# Patient Record
Sex: Male | Born: 1945 | Race: White | Hispanic: No | Marital: Married | Smoking: Former smoker
Health system: Southern US, Community
[De-identification: ages and names within clinical notes are randomized; demographics above are authoritative.]

## PROBLEM LIST (undated history)

## (undated) ENCOUNTER — Ambulatory Visit: Admission: EM | Payer: Medicare Other

## (undated) DIAGNOSIS — G8929 Other chronic pain: Secondary | ICD-10-CM

## (undated) DIAGNOSIS — M5136 Other intervertebral disc degeneration, lumbar region: Secondary | ICD-10-CM

## (undated) DIAGNOSIS — Z8719 Personal history of other diseases of the digestive system: Secondary | ICD-10-CM

## (undated) DIAGNOSIS — M545 Low back pain, unspecified: Secondary | ICD-10-CM

## (undated) DIAGNOSIS — Z972 Presence of dental prosthetic device (complete) (partial): Secondary | ICD-10-CM

## (undated) DIAGNOSIS — K222 Esophageal obstruction: Secondary | ICD-10-CM

## (undated) DIAGNOSIS — Z8739 Personal history of other diseases of the musculoskeletal system and connective tissue: Secondary | ICD-10-CM

## (undated) DIAGNOSIS — K219 Gastro-esophageal reflux disease without esophagitis: Secondary | ICD-10-CM

## (undated) DIAGNOSIS — M51369 Other intervertebral disc degeneration, lumbar region without mention of lumbar back pain or lower extremity pain: Secondary | ICD-10-CM

## (undated) DIAGNOSIS — I10 Essential (primary) hypertension: Secondary | ICD-10-CM

## (undated) DIAGNOSIS — I420 Dilated cardiomyopathy: Secondary | ICD-10-CM

## (undated) DIAGNOSIS — M1990 Unspecified osteoarthritis, unspecified site: Secondary | ICD-10-CM

## (undated) DIAGNOSIS — I5042 Chronic combined systolic (congestive) and diastolic (congestive) heart failure: Secondary | ICD-10-CM

## (undated) DIAGNOSIS — J449 Chronic obstructive pulmonary disease, unspecified: Secondary | ICD-10-CM

## (undated) DIAGNOSIS — I714 Abdominal aortic aneurysm, without rupture, unspecified: Secondary | ICD-10-CM

## (undated) DIAGNOSIS — I2510 Atherosclerotic heart disease of native coronary artery without angina pectoris: Secondary | ICD-10-CM

## (undated) DIAGNOSIS — N184 Chronic kidney disease, stage 4 (severe): Secondary | ICD-10-CM

## (undated) DIAGNOSIS — E785 Hyperlipidemia, unspecified: Secondary | ICD-10-CM

## (undated) DIAGNOSIS — I701 Atherosclerosis of renal artery: Secondary | ICD-10-CM

## (undated) DIAGNOSIS — J189 Pneumonia, unspecified organism: Secondary | ICD-10-CM

## (undated) DIAGNOSIS — E119 Type 2 diabetes mellitus without complications: Secondary | ICD-10-CM

## (undated) DIAGNOSIS — H9192 Unspecified hearing loss, left ear: Secondary | ICD-10-CM

## (undated) DIAGNOSIS — I480 Paroxysmal atrial fibrillation: Secondary | ICD-10-CM

## (undated) DIAGNOSIS — I493 Ventricular premature depolarization: Secondary | ICD-10-CM

## (undated) DIAGNOSIS — I255 Ischemic cardiomyopathy: Secondary | ICD-10-CM

## (undated) HISTORY — PX: LAPAROSCOPIC CHOLECYSTECTOMY: SUR755

## (undated) HISTORY — DX: Essential (primary) hypertension: I10

## (undated) HISTORY — PX: HERNIA REPAIR: SHX51

## (undated) HISTORY — DX: Chronic combined systolic (congestive) and diastolic (congestive) heart failure: I50.42

## (undated) HISTORY — DX: Esophageal obstruction: K22.2

## (undated) HISTORY — DX: Ischemic cardiomyopathy: I25.5

## (undated) HISTORY — DX: Gastro-esophageal reflux disease without esophagitis: K21.9

## (undated) HISTORY — PX: CORONARY ANGIOPLASTY WITH STENT PLACEMENT: SHX49

## (undated) HISTORY — DX: Unspecified osteoarthritis, unspecified site: M19.90

## (undated) HISTORY — DX: Abdominal aortic aneurysm, without rupture: I71.4

## (undated) HISTORY — DX: Atherosclerotic heart disease of native coronary artery without angina pectoris: I25.10

## (undated) HISTORY — DX: Ventricular premature depolarization: I49.3

## (undated) HISTORY — PX: CARDIAC CATHETERIZATION: SHX172

## (undated) HISTORY — DX: Chronic obstructive pulmonary disease, unspecified: J44.9

## (undated) HISTORY — DX: Abdominal aortic aneurysm, without rupture, unspecified: I71.40

## (undated) HISTORY — PX: UMBILICAL HERNIA REPAIR: SHX196

## (undated) HISTORY — DX: Atherosclerosis of renal artery: I70.1

## (undated) HISTORY — PX: CORONARY ANGIOPLASTY: SHX604

## (undated) HISTORY — DX: Hyperlipidemia, unspecified: E78.5

---

## 1989-01-29 DIAGNOSIS — J189 Pneumonia, unspecified organism: Secondary | ICD-10-CM

## 1989-01-29 HISTORY — DX: Pneumonia, unspecified organism: J18.9

## 1994-05-31 HISTORY — PX: LUNG SURGERY: SHX703

## 1994-05-31 HISTORY — PX: CORONARY ARTERY BYPASS GRAFT: SHX141

## 1998-05-31 HISTORY — PX: ESOPHAGOGASTRODUODENOSCOPY (EGD) WITH ESOPHAGEAL DILATION: SHX5812

## 1998-07-02 DIAGNOSIS — K222 Esophageal obstruction: Secondary | ICD-10-CM

## 1998-07-02 HISTORY — DX: Esophageal obstruction: K22.2

## 1999-01-30 HISTORY — PX: GREEN LIGHT LASER TURP (TRANSURETHRAL RESECTION OF PROSTATE: SHX6260

## 2003-11-15 ENCOUNTER — Inpatient Hospital Stay (HOSPITAL_BASED_OUTPATIENT_CLINIC_OR_DEPARTMENT_OTHER): Admission: RE | Admit: 2003-11-15 | Discharge: 2003-11-15 | Payer: Self-pay | Admitting: Cardiology

## 2003-11-18 ENCOUNTER — Ambulatory Visit (HOSPITAL_COMMUNITY): Admission: RE | Admit: 2003-11-18 | Discharge: 2003-11-19 | Payer: Self-pay | Admitting: Cardiology

## 2004-05-04 ENCOUNTER — Ambulatory Visit: Payer: Self-pay | Admitting: Cardiology

## 2004-05-04 ENCOUNTER — Inpatient Hospital Stay (HOSPITAL_COMMUNITY): Admission: EM | Admit: 2004-05-04 | Discharge: 2004-05-06 | Payer: Self-pay | Admitting: Emergency Medicine

## 2004-05-11 ENCOUNTER — Ambulatory Visit: Payer: Self-pay

## 2004-05-13 ENCOUNTER — Ambulatory Visit: Payer: Self-pay | Admitting: Cardiology

## 2004-05-31 DIAGNOSIS — I255 Ischemic cardiomyopathy: Secondary | ICD-10-CM

## 2004-05-31 HISTORY — DX: Ischemic cardiomyopathy: I25.5

## 2004-07-07 ENCOUNTER — Ambulatory Visit: Payer: Self-pay | Admitting: Gastroenterology

## 2004-07-10 ENCOUNTER — Ambulatory Visit: Payer: Self-pay | Admitting: Gastroenterology

## 2004-07-13 ENCOUNTER — Ambulatory Visit: Payer: Self-pay | Admitting: Gastroenterology

## 2004-08-18 ENCOUNTER — Ambulatory Visit: Payer: Self-pay | Admitting: Cardiology

## 2004-12-16 ENCOUNTER — Ambulatory Visit: Payer: Self-pay | Admitting: Cardiology

## 2005-04-14 ENCOUNTER — Inpatient Hospital Stay (HOSPITAL_COMMUNITY): Admission: EM | Admit: 2005-04-14 | Discharge: 2005-04-16 | Payer: Self-pay | Admitting: Emergency Medicine

## 2005-04-15 ENCOUNTER — Ambulatory Visit: Payer: Self-pay | Admitting: Internal Medicine

## 2005-04-15 ENCOUNTER — Ambulatory Visit: Payer: Self-pay | Admitting: Cardiology

## 2005-04-15 ENCOUNTER — Encounter: Payer: Self-pay | Admitting: Cardiology

## 2005-05-07 ENCOUNTER — Ambulatory Visit: Payer: Self-pay | Admitting: Cardiology

## 2005-05-17 ENCOUNTER — Ambulatory Visit: Payer: Self-pay | Admitting: Gastroenterology

## 2005-05-31 DIAGNOSIS — I2510 Atherosclerotic heart disease of native coronary artery without angina pectoris: Secondary | ICD-10-CM

## 2005-05-31 HISTORY — DX: Atherosclerotic heart disease of native coronary artery without angina pectoris: I25.10

## 2005-06-01 ENCOUNTER — Ambulatory Visit: Payer: Self-pay | Admitting: Gastroenterology

## 2005-06-04 ENCOUNTER — Ambulatory Visit (HOSPITAL_COMMUNITY): Admission: RE | Admit: 2005-06-04 | Discharge: 2005-06-04 | Payer: Self-pay | Admitting: Gastroenterology

## 2005-06-07 ENCOUNTER — Ambulatory Visit: Payer: Self-pay | Admitting: Cardiology

## 2005-06-28 ENCOUNTER — Ambulatory Visit: Payer: Self-pay | Admitting: Gastroenterology

## 2005-07-06 ENCOUNTER — Ambulatory Visit: Payer: Self-pay | Admitting: Cardiology

## 2005-07-27 ENCOUNTER — Ambulatory Visit: Payer: Self-pay | Admitting: Cardiology

## 2005-07-27 ENCOUNTER — Inpatient Hospital Stay (HOSPITAL_COMMUNITY): Admission: EM | Admit: 2005-07-27 | Discharge: 2005-08-06 | Payer: Self-pay | Admitting: *Deleted

## 2005-07-29 HISTORY — PX: CORONARY ARTERY BYPASS GRAFT: SHX141

## 2005-08-12 ENCOUNTER — Emergency Department (HOSPITAL_COMMUNITY): Admission: EM | Admit: 2005-08-12 | Discharge: 2005-08-12 | Payer: Self-pay | Admitting: Emergency Medicine

## 2005-08-17 ENCOUNTER — Ambulatory Visit: Payer: Self-pay | Admitting: Cardiology

## 2005-08-28 ENCOUNTER — Emergency Department: Payer: Self-pay | Admitting: Emergency Medicine

## 2005-10-05 ENCOUNTER — Ambulatory Visit: Payer: Self-pay | Admitting: Cardiology

## 2005-11-16 ENCOUNTER — Ambulatory Visit: Payer: Self-pay | Admitting: Cardiology

## 2006-05-12 ENCOUNTER — Ambulatory Visit: Payer: Self-pay | Admitting: Cardiovascular Disease

## 2006-05-12 ENCOUNTER — Inpatient Hospital Stay (HOSPITAL_COMMUNITY): Admission: EM | Admit: 2006-05-12 | Discharge: 2006-05-14 | Payer: Self-pay | Admitting: Emergency Medicine

## 2006-06-02 ENCOUNTER — Encounter: Payer: Self-pay | Admitting: Cardiology

## 2006-06-02 ENCOUNTER — Ambulatory Visit: Payer: Self-pay

## 2006-06-13 ENCOUNTER — Ambulatory Visit: Payer: Self-pay | Admitting: Cardiology

## 2006-06-13 LAB — CONVERTED CEMR LAB
BUN: 16 mg/dL (ref 6–23)
CO2: 29 meq/L (ref 19–32)
Calcium: 9.6 mg/dL (ref 8.4–10.5)
Chloride: 105 meq/L (ref 96–112)
Creatinine, Ser: 1.3 mg/dL (ref 0.4–1.5)
GFR calc non Af Amer: 59 mL/min
Glomerular Filtration Rate, Af Am: 72 mL/min/{1.73_m2}
Glucose, Bld: 82 mg/dL (ref 70–99)
Potassium: 4 meq/L (ref 3.5–5.1)
Sodium: 141 meq/L (ref 135–145)

## 2006-09-13 ENCOUNTER — Ambulatory Visit: Payer: Self-pay | Admitting: Cardiology

## 2006-10-14 ENCOUNTER — Ambulatory Visit: Payer: Self-pay | Admitting: Cardiovascular Disease

## 2006-10-21 ENCOUNTER — Ambulatory Visit: Payer: Self-pay | Admitting: Internal Medicine

## 2006-12-06 ENCOUNTER — Ambulatory Visit: Payer: Self-pay | Admitting: Cardiology

## 2006-12-19 ENCOUNTER — Ambulatory Visit: Payer: Self-pay | Admitting: Cardiology

## 2006-12-19 LAB — CONVERTED CEMR LAB
BUN: 18 mg/dL (ref 6–23)
CO2: 32 meq/L (ref 19–32)
Calcium: 9.1 mg/dL (ref 8.4–10.5)
Chloride: 108 meq/L (ref 96–112)
Creatinine, Ser: 1.3 mg/dL (ref 0.4–1.5)
GFR calc Af Amer: 72 mL/min
GFR calc non Af Amer: 59 mL/min
Glucose, Bld: 105 mg/dL — ABNORMAL HIGH (ref 70–99)
Potassium: 4.4 meq/L (ref 3.5–5.1)
Sodium: 145 meq/L (ref 135–145)

## 2006-12-30 ENCOUNTER — Ambulatory Visit: Payer: Self-pay | Admitting: Cardiology

## 2006-12-30 LAB — CONVERTED CEMR LAB
BUN: 31 mg/dL — ABNORMAL HIGH (ref 6–23)
CO2: 27 meq/L (ref 19–32)
Calcium: 9.7 mg/dL (ref 8.4–10.5)
Chloride: 109 meq/L (ref 96–112)
Creatinine, Ser: 2.1 mg/dL — ABNORMAL HIGH (ref 0.4–1.5)
GFR calc Af Amer: 41 mL/min
GFR calc non Af Amer: 34 mL/min
Glucose, Bld: 112 mg/dL — ABNORMAL HIGH (ref 70–99)
Potassium: 5.1 meq/L (ref 3.5–5.1)
Sodium: 142 meq/L (ref 135–145)

## 2007-01-06 ENCOUNTER — Ambulatory Visit: Payer: Self-pay | Admitting: Cardiology

## 2007-01-19 ENCOUNTER — Ambulatory Visit: Payer: Self-pay | Admitting: Cardiovascular Disease

## 2007-03-28 ENCOUNTER — Ambulatory Visit: Payer: Self-pay | Admitting: Cardiology

## 2007-06-26 ENCOUNTER — Ambulatory Visit: Payer: Self-pay | Admitting: Cardiology

## 2007-08-07 ENCOUNTER — Emergency Department: Payer: Self-pay | Admitting: Emergency Medicine

## 2007-08-07 ENCOUNTER — Other Ambulatory Visit: Payer: Self-pay

## 2007-08-29 ENCOUNTER — Ambulatory Visit: Payer: Self-pay | Admitting: Cardiology

## 2007-12-21 ENCOUNTER — Ambulatory Visit: Payer: Self-pay | Admitting: Cardiology

## 2008-06-25 ENCOUNTER — Ambulatory Visit: Payer: Self-pay | Admitting: Cardiology

## 2008-07-15 ENCOUNTER — Ambulatory Visit: Payer: Self-pay | Admitting: Family Medicine

## 2008-07-16 ENCOUNTER — Ambulatory Visit: Payer: Self-pay | Admitting: Cardiology

## 2008-07-16 ENCOUNTER — Encounter: Payer: Self-pay | Admitting: Cardiology

## 2008-07-16 ENCOUNTER — Observation Stay (HOSPITAL_COMMUNITY): Admission: EM | Admit: 2008-07-16 | Discharge: 2008-07-17 | Payer: Self-pay | Admitting: Emergency Medicine

## 2008-08-01 ENCOUNTER — Ambulatory Visit: Payer: Self-pay | Admitting: Gastroenterology

## 2008-08-01 LAB — HM COLONOSCOPY

## 2008-08-19 ENCOUNTER — Ambulatory Visit: Payer: Self-pay | Admitting: Cardiology

## 2008-08-19 ENCOUNTER — Encounter: Payer: Self-pay | Admitting: Cardiology

## 2008-08-19 ENCOUNTER — Inpatient Hospital Stay (HOSPITAL_COMMUNITY): Admission: AD | Admit: 2008-08-19 | Discharge: 2008-08-21 | Payer: Self-pay | Admitting: Cardiology

## 2008-09-04 ENCOUNTER — Encounter: Payer: Self-pay | Admitting: Cardiology

## 2008-09-04 ENCOUNTER — Ambulatory Visit: Payer: Self-pay | Admitting: Cardiology

## 2008-09-04 DIAGNOSIS — E1121 Type 2 diabetes mellitus with diabetic nephropathy: Secondary | ICD-10-CM

## 2008-09-04 DIAGNOSIS — Z951 Presence of aortocoronary bypass graft: Secondary | ICD-10-CM

## 2008-09-04 DIAGNOSIS — E782 Mixed hyperlipidemia: Secondary | ICD-10-CM

## 2008-10-01 ENCOUNTER — Ambulatory Visit: Payer: Self-pay | Admitting: Urology

## 2008-10-15 ENCOUNTER — Ambulatory Visit: Payer: Self-pay | Admitting: Urology

## 2008-12-05 ENCOUNTER — Ambulatory Visit: Payer: Self-pay | Admitting: Cardiology

## 2008-12-07 DIAGNOSIS — I701 Atherosclerosis of renal artery: Secondary | ICD-10-CM | POA: Insufficient documentation

## 2009-04-16 ENCOUNTER — Encounter (INDEPENDENT_AMBULATORY_CARE_PROVIDER_SITE_OTHER): Payer: Self-pay | Admitting: *Deleted

## 2009-06-24 ENCOUNTER — Encounter: Payer: Self-pay | Admitting: Orthopedic Surgery

## 2009-07-01 ENCOUNTER — Encounter: Payer: Self-pay | Admitting: Orthopedic Surgery

## 2009-09-15 ENCOUNTER — Encounter: Payer: Self-pay | Admitting: Cardiovascular Disease

## 2009-10-13 ENCOUNTER — Encounter: Payer: Self-pay | Admitting: Cardiovascular Disease

## 2009-10-13 ENCOUNTER — Telehealth: Payer: Self-pay | Admitting: Cardiovascular Disease

## 2009-10-14 ENCOUNTER — Ambulatory Visit: Payer: Self-pay | Admitting: Cardiovascular Disease

## 2009-10-15 ENCOUNTER — Encounter: Payer: Self-pay | Admitting: Cardiovascular Disease

## 2009-10-16 ENCOUNTER — Telehealth (INDEPENDENT_AMBULATORY_CARE_PROVIDER_SITE_OTHER): Payer: Self-pay | Admitting: *Deleted

## 2009-10-16 ENCOUNTER — Encounter: Payer: Self-pay | Admitting: Cardiovascular Disease

## 2009-10-16 LAB — CONVERTED CEMR LAB
BUN: 31 mg/dL
CO2: 20 meq/L
Calcium: 9.3 mg/dL
Chloride: 107 meq/L
Creatinine, Ser: 1.94 mg/dL
Glucose, Bld: 91 mg/dL
Potassium: 5.1 meq/L
Sodium: 141 meq/L
TSH: 3.22 microintl units/mL

## 2009-10-20 ENCOUNTER — Encounter (HOSPITAL_COMMUNITY): Admission: RE | Admit: 2009-10-20 | Discharge: 2009-12-31 | Payer: Self-pay | Admitting: Cardiovascular Disease

## 2009-10-20 ENCOUNTER — Ambulatory Visit: Payer: Self-pay | Admitting: Cardiology

## 2009-10-20 ENCOUNTER — Ambulatory Visit: Payer: Self-pay

## 2009-10-29 ENCOUNTER — Ambulatory Visit: Payer: Self-pay | Admitting: Cardiology

## 2009-11-12 ENCOUNTER — Telehealth: Payer: Self-pay | Admitting: Cardiology

## 2009-11-12 DIAGNOSIS — R0609 Other forms of dyspnea: Secondary | ICD-10-CM | POA: Insufficient documentation

## 2009-11-12 DIAGNOSIS — R0602 Shortness of breath: Secondary | ICD-10-CM

## 2009-11-13 ENCOUNTER — Ambulatory Visit: Payer: Self-pay | Admitting: Cardiovascular Disease

## 2009-11-13 ENCOUNTER — Ambulatory Visit: Payer: Self-pay

## 2009-11-13 ENCOUNTER — Ambulatory Visit (HOSPITAL_COMMUNITY): Admission: RE | Admit: 2009-11-13 | Discharge: 2009-11-13 | Payer: Self-pay | Admitting: Cardiology

## 2009-11-13 ENCOUNTER — Encounter: Payer: Self-pay | Admitting: Cardiology

## 2009-11-14 ENCOUNTER — Encounter: Payer: Self-pay | Admitting: Cardiology

## 2009-11-24 ENCOUNTER — Ambulatory Visit: Payer: Self-pay | Admitting: Cardiology

## 2010-01-16 ENCOUNTER — Encounter: Payer: Self-pay | Admitting: Cardiology

## 2010-05-04 ENCOUNTER — Encounter: Payer: Self-pay | Admitting: Cardiology

## 2010-05-04 ENCOUNTER — Ambulatory Visit: Payer: Self-pay | Admitting: Cardiology

## 2010-07-02 NOTE — Assessment & Plan Note (Signed)
Summary: Cardiology Nuclear Study  Nuclear Med Background Indications for Stress Test: Evaluation for Ischemia, Graft Patency   History: CABG, COPD, Echo, Emphysema, Heart Catheterization, Myocardial Perfusion Study  History Comments: '98 CABG x 4; '07 re-do CABG; '08 MPS:no ischemia, EF=48%; 2/10 Echo:EF=50-55%; 3/10 Cath: Patent grafts; h/o ICM  Symptoms: DOE, Fatigue, SOB    Nuclear Pre-Procedure Cardiac Risk Factors: Family History - CAD, History of Smoking, Hypertension, Lipids, NIDDM, Obesity, PVD Caffeine/Decaff Intake: none NPO After: 6:00 PM Lungs: Clear IV 0.9% NS with Angio Cath: 18g     IV Site: (R) AC IV Started by: Eliezer Lofts EMT-P Chest Size (in) 46     Height (in): 70 Weight (lb): 232 BMI: 33.41 Tech Comments: Toprol held > 24 hours, per patient.  Nuclear Med Study 1 or 2 day study:  1 day     Stress Test Type:  Stress Reading MD:  Dola Argyle, MD     Referring MD:  Ida Rogue, MD Resting Radionuclide:  Technetium 15m Tetrofosmin     Resting Radionuclide Dose:  11 mCi  Stress Radionuclide:  Technetium 40m Tetrofosmin     Stress Radionuclide Dose:  33 mCi   Stress Protocol Exercise Time (min):  7:31 min     Max HR:  136 bpm     Predicted Max HR:  99991111 bpm  Max Systolic BP: 0000000 mm Hg     Percent Max HR:  87.74 %     METS: 8.3 Rate Pressure Product:  [id number]    Stress Test Technologist:  Valetta Fuller CMA-N     Nuclear Technologist:  Charlton Amor CNMT  Rest Procedure  Myocardial perfusion imaging was performed at rest 45 minutes following the intravenous administration of Myoview Technetium 71m Tetrofosmin.  Stress Procedure  The patient exercised for  7:31.  The patient stopped due to fatigue and denied any chest pain.  There were no diagnostic ST-T wave changes, only frequent PVC's with couplets.  Myoview was injected at peak exercise and myocardial perfusion imaging was performed after a brief delay.  QPS Raw Data Images:  Normal; no motion  artifact; normal heart/lung ratio. Stress Images:  Slight decrease in activity in the apex and the base of the anterior wall Rest Images:  Normal homogeneous uptake in all areas of the myocardium. Subtraction (SDS):  No evidence of ischemia. Transient Ischemic Dilatation:  1.22  (Normal <1.22)  Lung/Heart Ratio:  .22  (Normal <0.45)  Quantitative Gated Spect Images QGS EDV:  134 ml QGS ESV:  75 ml QGS EF:  44 % QGS cine images:  Septal dyssynergy and inferior hypokinesis  Findings Low risk nuclear study      Overall Impression  Exercise Capacity: Fair exercise capacity. BP Response: Normal blood pressure response. Clinical Symptoms: SOB ECG Impression: No significant ST segment change suggestive of ischemia. Overall Impression Comments: There is slight decrease in activity in the apical cap and the base of the anterior wall with stress. This is not seen at rest. The quantitative analysis does not show an abnormality. There is no definite ischemia. The current EF is lower than prior values.   Appended Document: Cardiology Nuclear Study Overall, normal study.  Medical management  Appended Document: Cardiology Nuclear Study Spoke with pt's wife advised of results.  Pt has appt with Dr Lia Foyer next week for f/u. EWJ

## 2010-07-02 NOTE — Assessment & Plan Note (Signed)
Summary: Ronald [name]   Visit Type:  4 months follow up Referring Monty Mccarrell:  Lia Foyer Primary Pratyush Ammon:  Miguel Aschoff, MD  CC:  No cardiac complaints.  History of Present Illness: Overall doing well.  Just saw Dr. [name].  Had blood in urine.  Had antibiotic.  Has some hurting in the flank.  Supposed to see neprhologist next week.  Denies any chest pain, or progressive cardiac symptoms.  Please see prior notes regarding issues of renal artery stenosis.   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 Take 1 Tablet By Mouth Once A Day 2)  Lipitor 20 Mg Tabs (Atorvastatin Calcium) .... Take One Tablet P.o. At Bedtime 3)  Actos 15 Mg Tabs (Pioglitazone Hcl) .... Take 1 Tablet By Mouth Once A Day 4)  Aspirin 81 Mg Tbec (Aspirin) .... Take One Tablet By Mouth Daily 5)  Toprol Xl 50 Mg Xr24h-Tab (Metoprolol Succinate) .... Take 1/2 Tablet By Mouth Once A Day 6)  Omeprazole 20 Mg Tbec (Omeprazole) .... Take 1 Tablet By Mouth Once A Day 7)  Amlodipine Besylate 10 Mg Tabs (Amlodipine Besylate) .... Take 1 Tablet By Mouth Once A Day 8)  Nitroglycerin 0.4 Mg Subl (Nitroglycerin) .... One Tablet Under Tongue Every 5 Minutes As Needed For Chest Pain---May Repeat Times Three 9)  Magnesium Oxide 400 Mg Tabs (Magnesium Oxide) .... Take 1 Tablet By Mouth Two Times A Day 10)  Daily Multi  Tabs (Multiple Vitamins-Minerals) .[name].. 1 By Mouth Once Daily 11)  Tylenol Pm Extra Strength 500-25 Mg Tabs (Diphenhydramine-Apap (Sleep)) .[name].. 1 By Mouth Once Daily As Needed  Allergies: 1)  ! Penicillin 2)  ! Vicodin 3)  ! Hydrocodone 4)  ! Hydrochlorothiazide  Vital Signs:  Patient profile:   65 year old male Height:      70  inches Weight:      233.75 pounds BMI:     33.66 Pulse rate:   76 / minute Pulse rhythm:   regular Resp:     18 per minute BP sitting:   154 / 80  (left arm) Cuff size:   large  Vitals Entered By: Sidney Ace (May 04, 2010 4:36 PM)  Physical Exam  General:  Well developed, well nourished, in no acute distress. Head:  normocephalic and atraumatic Eyes:  PERRLA/EOM intact; conjunctiva and lids normal. Lungs:  Clear bilaterally to auscultation and percussion. Abdomen:  Bowel sounds positive; abdomen soft and non-tender without masses, organomegaly, or hernias noted. No hepatosplenomegaly. Pulses:  pulses normal in all 4 extremities Extremities:  No clubbing or cyanosis. Neurologic:  Alert and oriented x 3.   EKG  Procedure date:  05/04/2010  Findings:      NSR.  Frequent VPBs.  Otherwise no abnl.  Impression & Recommendations:  Problem # 1:  CORONARY ARTERY BYPASS GRAFT, HX OF (ICD-V45.81) stable from this standpoint at the present time. Denies any progression of symptoms.  Orders: EKG w/ Interpretation (93000)  Problem # 2:  MIXED HYPERLIPIDEMIA (ICD-272.2) followed by Dr. [name]. His updated medication list for this problem includes:    Lipitor 20 Mg Tabs (Atorvastatin calcium) .[name]... Take one tablet p.o. at bedtime  Problem # 3:  RENAL ARTERY STENOSIS (ICD-440.1) Is scheduled  for followup with Nephrology. Has some flank tenderness.  may need [place] or CT.   Patient Instructions: 1)  Your physician recommends that you schedule a follow-up appointment in: 6 months 2)  Your physician recommends that you continue on your current medications as directed. Please refer to the Current Medication list given to you today.

## 2010-07-02 NOTE — Progress Notes (Signed)
Summary: APPOINTMENTS  Phone Note Outgoing Call   Call placed by: Theodosia Quay, RN, BSN,  November 12, 2009 6:45 PM Call placed to: Patient's wife Summary of Call: I spoke with the pt's wife and made her aware that Dr Lia Foyer would like the pt to have an echocardiogram and a fasting  lipid and liver profile (272.0, 414.04) checked prior to his appt on 11/24/09.  I will have the scheduling department contact the pt with these appointments.   Initial call taken by: Theodosia Quay, RN, BSN,  November 12, 2009 6:45 PM  New Problems: SHORTNESS OF BREATH [phone])   New Problems: SHORTNESS OF BREATH (ICD-786.05)

## 2010-07-02 NOTE — Assessment & Plan Note (Signed)
Summary: ROV   Visit Type:  Follow-up Referring Provider:  Lia Foyer Primary Provider:  Miguel Aschoff, MD  CC:  Sob.  History of Present Illness: Patient felt poorly weeks ago,  He was short on wind.  He had no get up and go.  He had a nice workup with Dr. Rockey Situ, and was set up for a nuclear scan to revevaluate his situation.  He now is feeling a bit better than when he was first seen.  He underwent cath about a year ago, and at that time had a fairly thorough evaluation.  Dr. [name] continues to see him, and they are following his laboratory studies.  He was in bigeminy when he saw Dr. [name], and this led to evaluation.    Current Medications (verified): 1)  Allopurinol 100 Mg Tabs (Allopurinol) .... Take 1 Tablet By Mouth Once A Day 2)  Lipitor 20 Mg Tabs (Atorvastatin Calcium) .... Take One Tablet P.o. At Bedtime 3)  Actos 15 Mg Tabs (Pioglitazone Hcl) .... Take 1 Tablet By Mouth Once A Day 4)  Aspirin 81 Mg Tbec (Aspirin) .... Take One Tablet By Mouth Daily 5)  Toprol Xl 50 Mg Xr24h-Tab (Metoprolol Succinate) .... Take 1/2 Tablet By Mouth Once A Day 6)  Omeprazole 20 Mg Tbec (Omeprazole) .... Take 1 Tablet By Mouth Once A Day 7)  Amlodipine Besylate 10 Mg Tabs (Amlodipine Besylate) .... Take 1 Tablet By Mouth Once A Day 8)  Nitroglycerin 0.4 Mg Subl (Nitroglycerin) .... One Tablet Under Tongue Every 5 Minutes As Needed For Chest Pain---May Repeat Times Three 9)  Magnesium Oxide 400 Mg Tabs (Magnesium Oxide) .... Take 1 Tablet By Mouth Two Times A Day 10)  Daily Multi  Tabs (Multiple Vitamins-Minerals) .[name].. 1 By Mouth Once Daily 11)  Tylenol Pm Extra Strength 500-25 Mg Tabs (Diphenhydramine-Apap (Sleep)) .[name].. 1 By Mouth Once Daily As Needed  Allergies: 1)  ! Penicillin 2)  ! Vicodin 3)  ! Hydrocodone 4)  ! Hydrochlorothiazide  Vital Signs:  Patient profile:   65 year old male Height:      70 inches Weight:      234 pounds BMI:     33.70 Pulse rate:   72 / minute Pulse  rhythm:   regular Resp:     18 per minute BP sitting:   134 / 70  (right arm) Cuff size:   large  Vitals Entered By: Sidney Ace (October 29, 2009 4:20 PM)  Physical Exam  General:  Well developed, well nourished, in no acute distress. Head:  normocephalic and atraumatic Eyes:  PERRLA/EOM intact; conjunctiva and lids normal. Chest Wall:  MS well healed.  Lungs:  Clear bilaterally to auscultation and percussion. Heart:  PMI non displaced.  Normal S1 and S2.  no definite murmur.  Pos S4 gallop.  Abdomen:  Protuberant.  Do not appreciate a bruit.  No HS megaly. Pulses:  pulses normal in all 4 extremities Extremities:  No clubbing or cyanosis. Neurologic:  Alert and oriented x 3.   Nuclear ETT  Procedure date:  10/20/2009  Findings:      Exercised for 7 minutes.  Fatigue.  No chest pain. No diagnostic ECG changes.  Freqeunt PVCs with couplets.  EF 44%.  Small apical defect not seen at rest.    Nuclear ETT  Procedure date:  10/14/2008  Findings:       IMPRESSION:   1.  Reversible perfusion is identified involving the apical, mid,   and basilar segments of  the lateral wall.   2.  Normal left ventricular systolic function    3. Left ventricular ejection fraction equals 61%.   Cardiac Cath  Procedure date:  08/20/2008  Findings:       HEMODYNAMIC DATA:   1. The left main is free of critical disease.   2. The LAD courses distally and basically provides two diagonals.  The       second diagonal itself has probably about a 70-80% area of focal       stenosis, but the distal vessel was relatively small.  Whether this       accounts for the lateral ischemia is uncertain, but certainly could       be a potential source.   3. The internal mammary to the LAD distally is widely patent.   4. The circumflex is totally occluded proximally.   5. There is a new graft that represents basically a Y-graft.  Part of       the Y goes into the obtuse marginal and part into the diagonal.        Both limbs appear to be patent with typical changes seen in veins,       but without significant narrowing.  Runoff into the diagonal and OM       both appear to be excellent.   6. The old graft to the diagonal was occluded.   7. The old graft to the OM is no longer visible although the stent       remains.  There is some retrograde filling of this graft during the       injection of the new vein graft to the OM.   8. The right coronary artery has 80% proximal narrowing that is       totally occluded and then there is some bridging to the distal       vessel.   9. A new saphenous vein graft to the PDA appears widely patent.  It       does retrograde fill onto the distal posterolateral segment.   10.The saphenous vein graft to the crux is an old graft.  It has some       ostial narrowing of about 30-40% followed by widely patent stent.       The graft then opens up and has about 40-50% narrowing in the more       distal portion of the graft before it inserts.  After its       insertion, there is some mild narrowing, but this could be       partially related to competitive filling from the new vein graft to       the PDA.     Cardiac Cath  Procedure date:  08/20/2008  Findings:       11.Ventriculography was not performed and distal aortography was       performed.  There was mild atherosclerotic change in the aorta but       no evidence of high-grade obstruction of the mid aorta.  The       contrast load itself was fairly limited.  There may be a dual       supply to the renal arteries bilaterally.  The superior branch on       the left appears to have some ostial narrowing of up to 70-80% and       the inferior branch on the right may have some ostial narrowing as  well.  We will compare these to the renal ultrasound studies.      CONCLUSIONS:   1. Patent internal mammary to the LAD.   2. Patent new saphenous vein graft which is a Y-graft to the diagonal       and OM.    3. Patent saphenous vein graft to the PDA.   4. Patent old saphenous vein graft to the PDA with 50-60% mid-       narrowing.   5. Twin renal arteries bilaterally with some ostial narrowing on each       side.      DISPOSITION:   1. The patient will follow up with me in the office.   2. We will hydrate him aggressively overnight.  Recheck his creatinine       in the morning.   3. We will check a D-dimer.      Nuclear ETT  Procedure date:  07/17/2008  Findings:       LEFT VENTRICULAR EJECTION FRACTION    Findings:  QGS ejection fraction measures 61% , with an end-   diastolic volume of 123XX123 ml and an end-systolic volume of 45 ml.    IMPRESSION:   1.  Reversible perfusion is identified involving the apical, mid,   and basilar segments of the lateral wall.   2.  Normal left ventricular systolic function    3. Left ventricular ejection fraction equals 61%.    Read By:  Angelita Ingles,  M.D.   Released By:  Angelita Ingles,  M.D.  Impression & Recommendations:  Problem # 1:  CORONARY ARTERY BYPASS GRAFT, HX OF (ICD-V45.81) See most recent cath report, and nuclear study.  Fairly similar to prior study fifteen months earlier. EF is lower, but is a nuclear study.  Will continue to follow, but based on prior cath would not recommend any change in treatment.  We will see in followup, and at that time repeat echo to make sure no drop in EF.    Problem # 2:  MIXED HYPERLIPIDEMIA (ICD-272.2) Will need followup studies and can arrange at time of follow up visit.   His updated medication list for this problem includes:    Lipitor 20 Mg Tabs (Atorvastatin calcium) .[name]... Take one tablet p.o. at bedtime  Patient Instructions: 1)  Your physician recommends that you schedule a follow-up appointment in: 5 WEEKS 2)  Your physician recommends that you continue on your current medications as directed. Please refer to the Current Medication list given to you today.

## 2010-07-02 NOTE — Miscellaneous (Signed)
Summary: lab  Clinical Lists Changes  Observations: Added new observation of TSH: 3.220 microintl units/mL (10/16/2009 12:53) Added new observation of CALCIUM: 9.3 mg/dL (10/16/2009 12:53) Added new observation of CO2 PLSM/SER: 20 meq/L (10/16/2009 12:53) Added new observation of CL SERUM: 107 meq/L (10/16/2009 12:53) Added new observation of K SERUM: 5.1 meq/L (10/16/2009 12:53) Added new observation of NA: 141 meq/L (10/16/2009 12:53) Added new observation of CREATININE: 1.94 mg/dL (10/16/2009 12:53) Added new observation of BUN: 31 mg/dL (10/16/2009 12:53) Added new observation of BG RANDOM: 91 mg/dL (10/16/2009 12:53)      -  Date:  10/16/2009    BG Random: 91    BUN: 31    Creatinine: 1.94    Sodium: 141    Potassium: 5.1    Chloride: 107    CO2 Total: 20    Calcium: 9.3    TSH: 3.220

## 2010-07-02 NOTE — Progress Notes (Signed)
Summary: call from Dr. Alcide Clever  Phone Note Other Incoming   Summary of Call: Received call from Dr. [name]. Dr. [name] spoke with Dr. Angelena Form. Pt needs appt in [place] office tomorrow. I spoke with Izora Gala in [place] office. She will call pt with appt time and will also call Dr. [name] with appt time. Initial call taken by: Thompson Grayer, RN, BSN,  Oct 13, 2009 3:47 PM  Follow-up for Phone Call        I spoke to Dr. [name] at 3:45pm. The patient is feeling a little weak. He is in bigeminy. He will hold his Toprol tonight. Follow up in am in [place] office. Pt to go to ED if change in clinical status.  Follow-up by: Lauree Chandler, MD,  Oct 13, 2009 5:29 PM

## 2010-07-02 NOTE — Assessment & Plan Note (Signed)
Summary: EC6/NE   Visit Type:  rov Referring Provider:  Lia [name] Primary Provider:  Miguel Aschoff, MD  CC:  heart rate was low, pt skipping some beats today. Some short of wind, stays really tired, and no energy at all. going on about 2 weeks now. no chest pain or pressure. some edema but always had little swelling. [name]  History of Present Illness: [name] is a very pleasant 65 year old gentleman with known coronary artery disease, bypass surgery in 1998x4 vessel, stents in 2005, redo bypass in 2007 with catheterization last year showing patent grafts in March 2002 presents for urgent add-on for EKG changes in the symptoms.  He states that over the past 2 weeks or so, he is had worsening abdominal pain, fatigue, shortness of breath. He notices it all day long and particularly with exertion. He states that he's had abdominal pain previously when he is at coronary disease as needed intervention.  He was seen by Dr. Rosanna [name] yesterday and was noted to be in bigeminy though he was asymptomatic him a low heart rate and his Toprol was held.  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 Take 1 Tablet By Mouth Once A Day 2)  Lipitor 20 Mg Tabs (Atorvastatin Calcium) .... Take One Tablet P.o. At Bedtime 3)  Actos 15 Mg Tabs (Pioglitazone Hcl) .... Take 1 Tablet By Mouth Once A Day 4)  Aspirin 81 Mg Tbec (Aspirin) .... Take One Tablet By Mouth Daily 5)  Toprol Xl 50 Mg Xr24h-Tab (Metoprolol Succinate) .... Take 1 Tablet By Mouth Once A Day Stopped Yesterday 6)  Omeprazole 20 Mg Tbec (Omeprazole) .... Take 1 Tablet By Mouth Once A Day 7)  Amlodipine Besylate 10 Mg Tabs (Amlodipine Besylate) .... Take 1 Tablet By Mouth Once A Day 8)   Nitroglycerin 0.4 Mg Subl (Nitroglycerin) .... One Tablet Under Tongue Every 5 Minutes As Needed For Chest Pain---May Repeat Times Three 9)  Magnesium Oxide 400 Mg Tabs (Magnesium Oxide) .... Take 1 Tablet By Mouth Two Times A Day 10)  Daily Multi  Tabs (Multiple Vitamins-Minerals) .[name].. 1 By Mouth Once Daily 11)  Tylenol Pm Extra Strength 500-25 Mg Tabs (Diphenhydramine-Apap (Sleep)) .[name].. 1 By Mouth Once Daily As Needed  Past History:  Past Medical History: Last updated: August 22, 2008 1. Coronary artery disease status post bypass and subsequent redo       bypass in 2007.   2. Ischemic cardiomyopathy, ejection fraction 40% to 50% by 2D echo in       2006.   3. Chronic kidney disease with creatinine 1.9 to 2.   4. Hyperlipidemia.   5. Hypertension.   6. Renal artery stenosis.   7. Gout.   8. Diabetes since last 8 years.   9. Emphysema.  10. Diabetes 11. COPD  Past Surgical History: Last updated: 08/22/08  1. Coronary artery bypass grafting in 1996  2.  Redo of coronary artery bypass grafting in March 2007  3. Lung Repair- 1996    Family History: Last updated: 08-22-08 Father died of heart disease, rheumatic fever.  Mother   MI and a stroke.   Social History: Last updated: 08-22-2008 He was smoking.  He quit 22 years ago.  He used to   smoke 3 packs per day  for 20 years.  Alcohol, occasional beer.   Recreational drugs negative.   Review of Systems       The patient complains of dyspnea on exertion.  The patient denies fever, weight loss, weight gain, vision loss, decreased hearing, hoarseness, chest pain, syncope, peripheral edema, prolonged cough, abdominal pain, incontinence, muscle weakness, depression, and enlarged lymph nodes.         ABD discomfort, fatigue  Vital Signs:  Patient profile:   65 year old male Height:      70 inches Weight:      239.25 pounds BMI:     34.45 Pulse rate:   60 / minute Pulse rhythm:   irregular BP sitting:   138 / 72  (left  arm) Cuff size:   large  Vitals Entered By: Philemon Kingdom (Oct 14, 2009 11:22 AM)  Physical Exam  General:  well-appearing gentleman, obese in no apparent distress, HEENT TMs benign, neck is supple with no JVP or carotid bruits, heart sounds are regular with ectopy with S1-S2 and no murmurs appreciated, lungs are clear to auscultation with no wheezes rales, abdominal exams benign, no significant lower extremity edema, neurologic exam is nonfocal, skin is warm and dry. Pulses are equal and symmetrical in his upper and lower extremities.   Impression & Recommendations:  Problem # 1:  CORONARY ARTERY BYPASS GRAFT, HX OF (ICD-V45.81) hx of coronary artery disease, bypass in 1998 with redo in 2007, In March 2010 showing patent grafts now with his similar anginal equivalent with abdominal pain, shortness of breath and fatigue. Bigeminy on EKG . Given his extensive coronary artery disease and atypical type symptoms, we have suggested that he have a treadmill Myoview with followup with Dr.Stuckey in [place].  His Toprol was held though his heart rate is in the high 70s to low 80s at rest. We'll restart him on Toprol 25 mg daily.  Orders: Nuclear Stress Test (Nuc Stress Test)  Problem # 2:  MIXED HYPERLIPIDEMIA (ICD-272.2) We'll try to 10 his most recent lipid panel from Dr. Rosanna [name] for our records. His goal LDL is less than 70. He is on Lipitor 20 mg daily.  His updated medication list for this problem includes:    Lipitor 20 Mg Tabs (Atorvastatin calcium) .[name]... Take one tablet p.o. at bedtime  Patient Instructions: 1)  Your physician recommends that you schedule a follow-up appointment with Dr Ronald [name] after stress test 2)  Your physician has requested that you have an exercise stress myoview.  For further information please visit [email or web address].  Please follow instruction sheet, as given. 3)  Your physician has recommended you make the following change in your medication: Start  taking Metoprolol XL 25mg  daily (1/2 50mg  tablet).  Appended Document: EC6/NE EKG shows normal sinus rhythm with rate 78 beats per minute, rare PVC. Nonspecific ST changes noted in leads 2, 3, aVF.  Appended Document: EC6/NE Document reviewed.

## 2010-07-02 NOTE — Progress Notes (Signed)
Summary: Nuclear Pre-Procedure  Phone Note Outgoing Call Call back at [facility] Phone [phone]   Call placed by: Eliezer Lofts, EMT-P,  Oct 16, 2009 1:46 PM Action Taken: Phone Call Completed Summary of Call: Reviewed information on Myoview Information Sheet (see scanned document for further details).  Spoke with Patient.    Nuclear Med Background Indications for Stress Test: Evaluation for Ischemia, Graft Patency   History: CABG, COPD, Echo, Emphysema, Heart Catheterization  History Comments: '98 CABG x 4; '07 re-do 2/10 Echo: EF= 50-55% 3/10 Heart Cath: Patent grafts  Symptoms: DOE, Fatigue, Palpitations, SOB    Nuclear Pre-Procedure Cardiac Risk Factors: Family History - CAD, History of Smoking, Hypertension, Lipids, NIDDM, PVD Height (in): 70

## 2010-07-02 NOTE — Assessment & Plan Note (Signed)
Summary: f5w   Visit Type:  Follow-up Referring Provider:  Lia Foyer Primary Provider:  Miguel Aschoff, MD   History of Present Illness: Patient is feeling good again.  Stomach has settled down.  Seeing a neprhologist at that time.  We walked through prior evaluation.  Saw Dr. Burt Knack and had CT study, and then discussed possibility of angio.  Then saw Neprho in [place] who recommended not going that route.  Past evaluation discussed in detail.  Cardiac wise has been stable.   Current Medications (verified): 1)  Allopurinol 100 Mg Tabs (Allopurinol) .... Take 1 Tablet By Mouth Once A Day 2)  Lipitor 20 Mg Tabs (Atorvastatin Calcium) .... Take One Tablet P.o. At Bedtime 3)  Actos 15 Mg Tabs (Pioglitazone Hcl) .... Take 1 Tablet By Mouth Once A Day 4)  Aspirin 81 Mg Tbec (Aspirin) .... Take One Tablet By Mouth Daily 5)  Toprol Xl 50 Mg Xr24h-Tab (Metoprolol Succinate) .... Take 1/2 Tablet By Mouth Once A Day 6)  Omeprazole 20 Mg Tbec (Omeprazole) .... Take 1 Tablet By Mouth Once A Day 7)  Amlodipine Besylate 10 Mg Tabs (Amlodipine Besylate) .... Take 1 Tablet By Mouth Once A Day 8)  Nitroglycerin 0.4 Mg Subl (Nitroglycerin) .... One Tablet Under Tongue Every 5 Minutes As Needed For Chest Pain---May Repeat Times Three 9)  Magnesium Oxide 400 Mg Tabs (Magnesium Oxide) .... Take 1 Tablet By Mouth Two Times A Day 10)  Daily Multi  Tabs (Multiple Vitamins-Minerals) .[name].. 1 By Mouth Once Daily 11)  Tylenol Pm Extra Strength 500-25 Mg Tabs (Diphenhydramine-Apap (Sleep)) .[name].. 1 By Mouth Once Daily As Needed  Allergies (verified): 1)  ! Penicillin 2)  ! Vicodin 3)  ! Hydrocodone 4)  ! Hydrochlorothiazide  Past History:  Past Medical History: Last updated: 08/15/2008 1. Coronary artery disease status post bypass and subsequent redo       bypass in 2007.   2. Ischemic cardiomyopathy, ejection fraction 40% to 50% by 2D echo in       2006.   3. Chronic kidney disease with creatinine 1.9 to 2.     4. Hyperlipidemia.   5. Hypertension.   6. Renal artery stenosis.   7. Gout.   8. Diabetes since last 8 years.   9. Emphysema.  10. Diabetes 11. COPD  Vital Signs:  Patient profile:   65 year old male Height:      70 inches Weight:      227 pounds Pulse rate:   50 / minute BP sitting:   120 / 70  Vitals Entered By: Margaretmary Bayley CMA (November 24, 2009 4:35 PM)  Physical Exam  General:  Well developed, well nourished, in no acute distress. Head:  normocephalic and atraumatic Eyes:  PERRLA/EOM intact; conjunctiva and lids normal. Lungs:  Clear bilaterally to auscultation and percussion. Heart:  PMI non displaced.  Normal S1 and S2.  Pos S4 gallop.  Abdomen:  Protuberant without masses.  No hepatosplenomegaly. Extremities:  trace edema. Neurologic:  Alert and oriented x 3.   CT Scan  Procedure date:  10/21/2006  Findings:      Impression:   1. 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 Consider   interventional   radiology consultation [phone]) as the lesions may be amenable to   percutaneous   treatment if confirmed to be significant.   2. Mild ostial stenosis of the middle right and inferior  left renal   arteries of   doubtful hemodynamic significance.   3. Origin IMA stenosis. Widely patent celiac axis and SMA likely   prevent this   from being clinically significant.    Read By:  Vernard Gambles, D. Quillian Quince,  M.D.   Released By:  Lucrezia Europe. Quillian Quince,  M.D.   Echocardiogram  Procedure date:  11/13/2009  Findings:       Study Conclusions    - Left ventricle: Distal septum and inferobase are hypokinetic The     cavity size was mildly dilated. Wall thickness was normal.     Systolic function was mildly reduced. The estimated ejection     fraction was in the range of 45% to 50%. Diffuse hypokinesis.     Hypokinesis of the anteroseptal and inferior myocardium. Left     ventricular  diastolic function parameters were normal.   - Mitral valve: Mildly calcified annulus. Mildly thickened leaflets     . Mild regurgitation.   - Left atrium: The atrium was mildly dilated.   - Atrial septum: No defect or patent foramen ovale was identified.     Echo contrast study showed no shunt.   Cardiac Cath  Procedure date:  08/20/2008  Findings:      .Ventriculography was not performed and distal aortography was     performed.  There was mild atherosclerotic change in the aorta but     no evidence of high-grade obstruction of the mid aorta.  The     contrast load itself was fairly limited.  There may be a dual     supply to the renal arteries bilaterally.  The superior branch on     the left appears to have some ostial narrowing of up to 70-80% and     the inferior branch on the right may have some ostial narrowing as     well.  We will compare these to the renal ultrasound studies.   CONCLUSIONS: 1. Patent internal mammary to the LAD. 2. Patent new saphenous vein graft which is a Y-graft to the diagonal     and OM. 3. Patent saphenous vein graft to the PDA. 4. Patent old saphenous vein graft to the PDA with 50-60% mid-     narrowing. 5. Twin renal arteries bilaterally with some ostial narrowing on each     side.   DISPOSITION: 1. The patient will follow up with me in the office. 2. We will hydrate him aggressively overnight.  Recheck his creatinine     in the morning. 3. We will check a D-dimer.         Loretha Brasil. Lia Foyer, MD, [facility] Electronically Signed     Impression & Recommendations:  Problem # 1:  CORONARY ARTERY BYPASS GRAFT, HX OF (ICD-V45.81) cardiac wise appears stable at the present time. Continue current medications.  Problem # 2:  RENAL ARTERY STENOSIS (ICD-440.1) See CT scan report, and report of cath study as noted.  To see Neprhology in [place]. They will reevaluate.    Problem # 3:  MIXED HYPERLIPIDEMIA (ICD-272.2) currently followed by Dr.  [name]. His updated medication list for this problem includes:    Lipitor 20 Mg Tabs (Atorvastatin calcium) .[name]... Take one tablet p.o. 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  His updated medication list for this problem includes:    Actos 15 Mg Tabs (Pioglitazone hcl) .[name]... Take 1 tablet by mouth once a day  Aspirin 81 Mg Tbec (Aspirin) .[name]... Take one tablet by mouth daily  Problem # 5:  SHORTNESS OF BREATH (ICD-786.05) Seems to be improved at present time.  BP has been under better control.  His updated medication list for this problem includes:    Aspirin 81 Mg Tbec (Aspirin) .[name]... Take one tablet by mouth daily    Toprol Xl 50 Mg Xr24h-tab (Metoprolol succinate) .[name]... Take 1/2 tablet by mouth once a day    Amlodipine Besylate 10 Mg Tabs (Amlodipine besylate) .[name]... Take 1 tablet by mouth once a day  Patient Instructions: 1)  Your physician wants you to follow-up in:   4 MONTHS. You will receive a reminder letter in the mail two months in advance. If you don't receive a letter, please call our office to schedule the follow-up appointment. 2)  Your physician recommends that you continue on your current medications as directed. Please refer to the Current Medication list given to you today.

## 2010-09-10 LAB — CBC
HCT: 41.6 % (ref 39.0–52.0)
HCT: 42.3 % (ref 39.0–52.0)
Hemoglobin: 14.4 g/dL (ref 13.0–17.0)
Hemoglobin: 14.9 g/dL (ref 13.0–17.0)
MCV: 86.9 fL (ref 78.0–100.0)
RBC: 4.74 MIL/uL (ref 4.22–5.81)
RBC: 4.87 MIL/uL (ref 4.22–5.81)
WBC: 6.2 10*3/uL (ref 4.0–10.5)
WBC: 6.9 10*3/uL (ref 4.0–10.5)

## 2010-09-10 LAB — COMPREHENSIVE METABOLIC PANEL
Albumin: 3.9 g/dL (ref 3.5–5.2)
Alkaline Phosphatase: 176 U/L — ABNORMAL HIGH (ref 39–117)
BUN: 24 mg/dL — ABNORMAL HIGH (ref 6–23)
CO2: 25 mEq/L (ref 19–32)
Chloride: 107 mEq/L (ref 96–112)
Creatinine, Ser: 1.6 mg/dL — ABNORMAL HIGH (ref 0.4–1.5)
GFR calc non Af Amer: 44 mL/min — ABNORMAL LOW (ref >60)
Glucose, Bld: 98 mg/dL (ref 70–99)
Potassium: 4.7 mEq/L (ref 3.5–5.1)
Total Bilirubin: 1.7 mg/dL — ABNORMAL HIGH (ref 0.3–1.2)

## 2010-09-10 LAB — BASIC METABOLIC PANEL
BUN: 18 mg/dL (ref 6–23)
Calcium: 9.2 mg/dL (ref 8.4–10.5)
Chloride: 109 mEq/L (ref 96–112)
Creatinine, Ser: 1.31 mg/dL (ref 0.4–1.5)
GFR calc Af Amer: >60 mL/min (ref >60)
GFR calc non Af Amer: 55 mL/min — ABNORMAL LOW (ref >60)
GFR calc non Af Amer: 57 mL/min — ABNORMAL LOW (ref >60)
Glucose, Bld: 96 mg/dL (ref 70–99)
Potassium: 3.8 mEq/L (ref 3.5–5.1)
Potassium: 4.4 mEq/L (ref 3.5–5.1)
Sodium: 139 mEq/L (ref 135–145)

## 2010-09-10 LAB — CARDIAC PANEL(CRET KIN+CKTOT+MB+TROPI)
CK, MB: 1.2 ng/mL (ref 0.3–4.0)
Troponin I: <0.01 ng/mL (ref 0.00–0.06)

## 2010-09-10 LAB — DIFFERENTIAL
Basophils Absolute: 0.1 10*3/uL (ref 0.0–0.1)
Basophils Relative: 1 % (ref 0–1)
Lymphocytes Relative: 13 % (ref 12–46)
Monocytes Absolute: 0.8 10*3/uL (ref 0.1–1.0)
Neutro Abs: 5.1 10*3/uL (ref 1.7–7.7)

## 2010-09-10 LAB — BRAIN NATRIURETIC PEPTIDE: Pro B Natriuretic peptide (BNP): 35 pg/mL (ref 0.0–100.0)

## 2010-09-10 LAB — PROTIME-INR: Prothrombin Time: 12.4 seconds (ref 11.6–15.2)

## 2010-09-10 LAB — GLUCOSE, CAPILLARY: Glucose-Capillary: 109 mg/dL — ABNORMAL HIGH (ref 70–99)

## 2010-09-10 LAB — TSH: TSH: 2.698 u[IU]/mL (ref 0.350–4.500)

## 2010-09-15 LAB — GLUCOSE, CAPILLARY
Glucose-Capillary: 124 mg/dL — ABNORMAL HIGH (ref 70–99)
Glucose-Capillary: 132 mg/dL — ABNORMAL HIGH (ref 70–99)
Glucose-Capillary: 97 mg/dL (ref 70–99)
Glucose-Capillary: 97 mg/dL (ref 70–99)

## 2010-09-15 LAB — BASIC METABOLIC PANEL
BUN: 16 mg/dL (ref 6–23)
BUN: 18 mg/dL (ref 6–23)
CO2: 24 mEq/L (ref 19–32)
CO2: 24 mEq/L (ref 19–32)
Chloride: 106 mEq/L (ref 96–112)
Creatinine, Ser: 1.5 mg/dL (ref 0.4–1.5)
GFR calc non Af Amer: 42 mL/min — ABNORMAL LOW (ref >60)
GFR calc non Af Amer: 43 mL/min — ABNORMAL LOW (ref >60)
GFR calc non Af Amer: 47 mL/min — ABNORMAL LOW (ref >60)
Glucose, Bld: 138 mg/dL — ABNORMAL HIGH (ref 70–99)
Glucose, Bld: 145 mg/dL — ABNORMAL HIGH (ref 70–99)
Potassium: 4.3 mEq/L (ref 3.5–5.1)
Potassium: 4.1 mEq/L (ref 3.5–5.1)
Potassium: 4.2 mEq/L (ref 3.5–5.1)
Sodium: 140 mEq/L (ref 135–145)

## 2010-09-15 LAB — CK TOTAL AND CKMB (NOT AT ARMC): CK, MB: 0.5 ng/mL (ref 0.3–4.0)

## 2010-09-15 LAB — PROTIME-INR: Prothrombin Time: 12.6 seconds (ref 11.6–15.2)

## 2010-09-15 LAB — CBC
HCT: 45.8 % (ref 39.0–52.0)
HCT: 42 % (ref 39.0–52.0)
HCT: 43.3 % (ref 39.0–52.0)
Hemoglobin: 15.7 g/dL (ref 13.0–17.0)
MCHC: 34.3 g/dL (ref 30.0–36.0)
MCHC: 34.7 g/dL (ref 30.0–36.0)
MCHC: 34.8 g/dL (ref 30.0–36.0)
MCV: 87.9 fL (ref 78.0–100.0)
MCV: 86.8 fL (ref 78.0–100.0)
Platelets: 129 10*3/uL — ABNORMAL LOW (ref 150–400)
Platelets: 134 10*3/uL — ABNORMAL LOW (ref 150–400)
RDW: 14.2 % (ref 11.5–15.5)
RDW: 14.6 % (ref 11.5–15.5)

## 2010-09-15 LAB — HEPATIC FUNCTION PANEL
ALT: 30 U/L (ref 0–53)
Indirect Bilirubin: 1.1 mg/dL — ABNORMAL HIGH (ref 0.3–0.9)
Total Protein: 6.1 g/dL (ref 6.0–8.3)

## 2010-09-15 LAB — DIFFERENTIAL
Basophils Absolute: 0 10*3/uL (ref 0.0–0.1)
Eosinophils Absolute: 0.1 10*3/uL (ref 0.0–0.7)
Eosinophils Relative: 1 % (ref 0–5)
Lymphocytes Relative: 13 % (ref 12–46)
Monocytes Absolute: 0.7 10*3/uL (ref 0.1–1.0)

## 2010-09-15 LAB — HEPARIN LEVEL (UNFRACTIONATED): Heparin Unfractionated: 0.32 IU/mL (ref 0.30–0.70)

## 2010-09-15 LAB — CARDIAC PANEL(CRET KIN+CKTOT+MB+TROPI)
Relative Index: INVALID (ref 0.0–2.5)
Troponin I: <0.01 ng/mL (ref 0.00–0.06)

## 2010-09-15 LAB — POCT CARDIAC MARKERS: Troponin i, poc: <0.05 ng/mL (ref 0.00–0.09)

## 2010-09-15 LAB — TROPONIN I: Troponin I: <0.01 ng/mL (ref 0.00–0.06)

## 2010-10-13 NOTE — Progress Notes (Signed)
[facility]                        PERIPHERAL VASCULAR OFFICE NOTE   NAME:Magda, NARON COBURN                       MRN:          [id number]  DATE:10/14/2006                            DOB:          [birth year]    Keimari Tranchina is a very pleasant 65 year old gentleman, who presents  today for evaluation of suspected renal artery stenosis.   Mr. Tassinari has severe coronary artery disease and has been through two  bypass surgeries, originally in 1996 and again in March of 2007.  He has  had longstanding hypertension that has been somewhat difficult to  control.  He underwent a renal ultrasound back in January of this year  that demonstrated a discrepancy in kidney size.  His right kidney is  10.2 cm and his left kidney is 7.8 cm in length.  The study was  technically limited, due to his abdominal girth.  There appeared to be  low flow in both kidneys with multiple cysts in the left kidney.  The  right renal artery was not well-seen.  The proximal left renal artery  was poorly seen, as well, but was patent.  There was at least mild  atherosclerosis in the abdominal aorta and velocities were not recorded  over multiple areas of the renal artery, due to inability to visualize.   Mr. Delsignore has mild renal insufficiency with the most recent creatinine  of 1.3 on January 14 of this year.  He reports home systolic blood  pressures oftentimes greater than 150.   PAST MEDICAL HISTORY:  1. Coronary artery disease, status post two prior bypass surgeries,      most recently in March 2007.  No angina at present.  2. Essential hypertension.  3. Dyslipidemia.  4. Gout.  5. Gastroesophageal reflux disease.  6. Lung surgery, required for a pneumothorax at the time of coronary      bypass surgery.   FAMILY HISTORY:  His mother is 32 years old.  She has had a myocardial  infarction and a stroke in the past.  Her first manifestation of  coronary disease was in her  fifties.  His father died at 37 of rheumatic  valvular disease.   SOCIAL HISTORY:  The patient is married.  He lives in [place].  He is a  former smoker, but quit in 1988.  He is retired.  He worked in the [employer], as well as for the city of [place]:  Complete 12-point review of systems was performed.  There were no pertinent positives to add.   PHYSICAL EXAMINATION:  The patient is alert and oriented.  He is in no  acute distress.  Weight is 219 pounds.  Blood pressure 150/80 in the  right arm, 150/88 in the left arm.  Heart rate is 72.  Respiratory rate  16.  HEENT:  Normal.  NECK:  Normal carotid upstrokes without bruits.  Jugular venous pressure  is normal.  There is no thyromegaly or thyroid nodules.  LUNGS:  Clear to auscultation bilaterally.  HEART:  The apex is discrete and nondisplaced.  The  heart is regular  rate and rhythm without murmurs or gallops.  ABDOMEN:  Soft, obese.  No organomegaly, no abdominal bruits.  BACK:  There is no flank tenderness.  EXTREMITIES:  No clubbing, cyanosis or edema.  Peripheral pulses are  intact and equal.  SKIN:  Warm and dry without rash.  NEUROLOGIC:  Cranial nerves II through XII are intact.  Strength is 5/5  and equal in the arms and legs bilaterally.   ASSESSMENT:  Mr. Haniff is a 65 year old gentleman with hypertension and  a discrepancy in his kidney size, based on renal ultrasound.  There is  some suggestion of low flow to both kidneys from the renal ultrasound.  He certainly is at risk for renal artery stenosis in the setting of his  known atherosclerotic coronary artery disease.  I think he needs some  further evaluation for renal artery stenosis, since the ultrasound study  was technically limited.  Options would be renal MRA, CT angio or  invasive angiography.  I think that renal MRA certainly has significant  limitations and is not a favorable approach.  CT angio and invasive  angiography  are both reasonable approaches.  I initially recommended  angiography, but Mr. Cicotte was reluctant due to the fact that he is  still paying hospital bills and it is very difficult for him to keep up  with those from a financial standpoint.  We decided to try to get  precertification for a CT renal angiogram and, if we are able to get  that pre-authorized for full payment, then we could proceed with that.  If the CT angiogram is completely normal, there would obviously be no  need to pursue angiography.  If the angiogram suggests high-grade renal  artery stenosis, then we could certainly go forward and do invasive  angiography as needed.   In the meantime, he will continue on his current antihypertensive  regimen.  We discussed the rationale for this diagnostic approach in  detail and he and his wife are both happy with that.  I have set up a  six-month followup visit, just in case the CT scan is not going to be  covered, so that Mr. Airey remains in the network for followup of his  potential renal artery stenosis.     Juanda Bond. Burt Knack, MD  Electronically Signed    MDC/MedQ  DD: 10/14/2006  DT: 10/14/2006  Job #: [id number]   cc:   Loretha Brasil. Lia Foyer, MD, [facility]  Richard [name]

## 2010-10-13 NOTE — Assessment & Plan Note (Signed)
[facility]                            CARDIOLOGY OFFICE NOTE   NAME:Ronald [name]                       MRN:          [id number]  DATE:12/06/2006                            DOB:          1945/12/16    [name] is in for a followup visit.  In general, the patient has been  stable.  He has not been having any ongoing chest pain or significant  shortness of breath.  Since I last saw him, he has seen Dr. Burt Knack.  He  underwent a CT angiogram.  Ronald [name] reviewed this and has scheduled  the patient to be seen back in followup in November.  The patient has  actually been feeling quite well.   The patient has been reluctant to pursue other studies at the present  time, in part because of cost.   The other problem has been that the patient feels some discomfort going  down the left leg when he rides his motorcycle for a while.   CURRENT MEDICATIONS:  1. Colchicine 0.6 mg daily.  2. Allopurinol 100 mg daily.  3. Lipitor 20 mg nightly.  4. Actos 15 mg daily.  5. Multivitamin daily.  6. Aspirin 81 mg daily.  7. Toprol XL 50 mg daily.  8. Omeprazole 20 mg daily.  9. Amlodipine 5 mg daily.   PHYSICAL EXAMINATION:  The patient is alert and oriented.  He denies any  ongoing chest pain.  The weight is 223 pounds.  The blood pressure 150/78, and the pulse is  64 and regular.  The lung fields are clear.  Median sternotomy is well healed.  He does have pulses in the left foot, although they are, perhaps,  slightly diminished.   IMPRESSION:  1. Coronary artery disease, status post 2 prior bypass surgeries with      most recent in March of 2007.  2. Non insulin-dependent diabetes mellitus.  3. Mild-to-moderate bilateral renal artery stenosis.  4. Hypertension.  5. History of gout.  6. History of dyslipidemia.  7. Gastroesophageal reflux.   PLAN:  1. We will have him return in 2-3 months.  2. He will need a basic metabolic profile in followup.  3. He should continue his current medical regimen which does not      include angiotensin-converting enzyme inhibition.  4. He should continue to follow up closely with Dr. Burt Knack for      followup of his renal artery stenosis.     Ronald [name]. Lia Foyer, MD, [facility]  Electronically Signed    TDS/MedQ  DD: 12/07/2006  DT: 12/07/2006  Job #: [id number]

## 2010-10-13 NOTE — Discharge Summary (Signed)
NAME:  Ronald [name], Ronald [name]                ACCOUNT NO.:  [id number]   MEDICAL RECORD NO.:  [id number]          PATIENT TYPE:  INP   LOCATION:  4703                         FACILITY:  [facility]   PHYSICIAN:  Loretha Brasil. Lia Foyer, MD, FACCDATE OF BIRTH:  1945-09-16   DATE OF ADMISSION:  08/19/2008  DATE OF DISCHARGE:  08/21/2008                               DISCHARGE SUMMARY   PRIMARY CARDIOLOGIST:  Marcello Moores D. Lia Foyer, MD, [facility]   PRIMARY CARE PHYSICIAN:  Not listed.   PROCEDURES PERFORMED DURING HOSPITALIZATION:  1. Cardiac catheterization performed by Dr. Bing Quarry on August 20, 2008.  A:  Patent left internal mammary artery to left anterior descending,  patent new saphenous vein graft which is a Y-graft to the diagonal and  obtuse marginal, patent saphenous vein graft to posterior descending  artery, patent old saphenous vein graft to posterior descending artery  with a 50-60% mid narrowing, twin renal arteries bilaterally with some  ostial narrowing on each side of up to 70-80% and the inferior branch on  the right may have some ostial narrowing as well.   FINAL DISCHARGE DIAGNOSES:  1. Coronary artery disease.  A:  Status post coronary artery bypass graft with subsequent redo in  2007.  Initially, left internal mammary artery to left anterior  descending, saphenous vein graft to posterior descending artery status  post new saphenous vein graft which is a Y-graft to diagonal and obtuse  marginal.  B:  Status post cardiac catheterization revealing patent grafts with old  saphenous vein graft to the posterior descending artery with a 50-60%  mid narrowing.  1. Bilateral renal artery stenosis with some ostial narrowing on each      side, superior branch on the left appears to have ostial narrowing      of 70-80% and inferior branch right may have some ostial narrowing      as well.  2. Chronic kidney disease with creatinines ranging from 1.3-2.  3. Hyperlipidemia.  4. Hypertension.  5. History of recurrent gout.  6. Non-insulin-dependent diabetes.  7. Emphysema.   HOSPITAL COURSE:  This is a 65 year old Caucasian male who was admitted  at the request of Dr. Lia Foyer secondary to chest pain which appeared to  be atypical at the time of evaluation per Dr. Lia Foyer.  The patient did  have a stress Myoview demonstrating no ischemia in stage III but with  PVCs.  That nuclear study was mildly abnormal, suggesting some lateral  ischemia.  The patient had recurrent symptoms and it was felt it would  be best to reevaluate him with cardiac catheterization.  This was done  conservatively as he had an elevated creatinine and no renal artery  stenosis in the setting of diabetes.   The patient did undergo cardiac catheterization as described above.  Please see Dr. Maren Beach thorough cardiac catheterization note for more  details.  The patient was kept overnight for hydration post  catheterization to avoid IV dye nephropathy with subsequent creatinine  check the following morning.  The patient on admission had a creatinine  of 1.31, on  discharge post hydration creatinine was 1.27.  A D-dimer was  also checked post catheterization and was found to be negative at 0.29.  The patient was seen and examined by Dr. Bing Quarry on day of  discharge and found to be stable with no recurrence of chest discomfort.  Review of followup labs was completed.  The patient also had an  echocardiogram revealing trivial AI and mild MR.  The patient will  follow up with Dr. Lia Foyer in 2 weeks and will be evaluated for need for  intervention on renal arteries at his discretion.  The patient  verbalized understanding and has been given post cardiac catheterization  instructions.  The patient will have no changes in his medications post  discharge.   DISCHARGE LABORATORY DATA:  Sodium 139, potassium 3.8, chloride 106, CO2  25, glucose 81, BUN 13, and creatinine 1.27.  Hemoglobin 14.4,  hematocrit  41.6, white blood cells 6.2, and platelets 130.  D-dimer  0.29.  TSH 2.698.  BNP 35.  Cardiac enzymes were found to be negative  x2.   DISCHARGE VITAL SIGNS:  Blood pressure on discharge 123/72 with a pulse  of 73, temperature 97.7, and O2 sat 95% on room air.   DISCHARGE MEDICATIONS:  1. Amlodipine 10 mg daily.  2. Omeprazole 20 mg daily.  3. Actos 50 mg daily.  4. Metoprolol 50 mg daily.  5. Aspirin 81 mg daily.  6. Multivitamin daily.  7. Lipitor 20 mg daily.  8. Allopurinol 100 mg daily.  9. Magnesium 400 mg twice a day.  10.Nitroglycerin p.r.n.   ALLERGIES:  To PENICILLIN, HYDROCHLOROTHIAZIDE, and VICODIN.   FOLLOWUP PLANS AND APPOINTMENTS:  1. The patient will follow up with Dr. Bing Quarry on September 04, 2008      at 10:15 a.m.  2. The patient has been given post cardiac catheterization      instructions with particular emphasis on the right groin site for      evidence of bleeding, hematoma, and signs of infection.  3. The patient will follow up with primary care physician.  Further      evaluation found that this patient was seen by Dr. Miguel Aschoff      in [place] for continued medical management.   TIME SPENT:  With the patient to include physician time is 35 minutes.      Phill Myron. Purcell Nails, NP      Loretha Brasil. Lia Foyer, MD, [facility]  Electronically Signed   KML/MEDQ  D:  08/21/2008  T:  08/21/2008  Job:  [id number]   cc:   Miguel Aschoff

## 2010-10-13 NOTE — Assessment & Plan Note (Signed)
[facility]                            CARDIOLOGY OFFICE NOTE   NAME:Ronald [name], Ronald [name]                       MRN:          [id number]  DATE:08/29/2007                            DOB:          12-Jan-1946    Ronald [name] is in for followup.  He has been seen recently by Dr. Pat [name] for  evidence of cholecystitis and cholelithiasis.  The patient has had  largely stable cardiac status, having coronary artery disease and redo  surgery.  He has not had any recurrent ischemic symptoms, and has  totally remained stable from that standpoint.  From that standpoint in  general, there have been no limitations with regard to necessary  evaluation for surgery.  He should remain on beta-blockers for that to  be accomplished.  He has had some renal insufficiency and it has been  intermittent.  It seems to be hypovolemia potentially related and there  has been no discussion about whether or not he should have renal  intervention.  The consensus decision has been to hold off on this at  the present time.  Nonetheless, his volume status has to be watched  carefully with regard to surgical intervention.  From the standpoint of  his coronary situation, however, there is no specific contraindications  to proceeding from that standpoint.  The patient is followed closely by  Dr. Rosanna [name].   MEDICATIONS:  1. Allopurinol 100 mg daily.  2. Colchicine 0.6 mg daily.  3. Lipitor 20 mg nightly.  4. Actos 15 mg daily.  5. Multivitamin daily.  6. Aspirin 81 mg daily.  7. Toprol XL 50 mg daily.  8. Omeprazole 20 mg daily.  9. Amlodipine 10 mg daily.   PHYSICAL EXAMINATION:  GENERAL:  He is alert and oriented.  VITAL SIGNS:  Weight 229 pounds.  Blood pressure 147/77, pulse 68.  LUNGS:  The lung fields are clear.  CARDIAC:  There is an S4 gallop.   The electrocardiogram demonstrates normal sinus rhythm essentially  within normal limits.   IMPRESSION:  1. Coronary artery disease with  coronary redo bypass surgery March      2007, with a saphenous vein graft to the posterior descending and      saphenous vein graft to the obtuse marginal.  2. Mild ischemic cardiomyopathy.  3. Diabetes.  4. Gout.  5. Hyperlipidemia.  6. History of bilateral renal artery stenosis.  7. Status post bleb removal.  8. Hypertension.  9. Intolerance to Vicodin, penicillin and nitrates.   The most recent CT scan shows diffuse atheromatous irregularity of the  abdominal aorta.  There is calcified plaque at the origin of the celiac  axis with less than 50% stenosis by CT.  Distal SMA is widely patent.  There is three right renal arteries appeared most as diminutive.  The  middle has ostial plaque of less than 50% and the inferior right renal  has a 75% stenosis.  There are two left renal arteries.  The superior  left renal artery has calcified ostial plaque with 75% stenosis.  Inferior left renal artery noncalcified ostial plaque of  50%.  The  inferior mesenteric artery suggests a 75% ostial stenosis.     Ronald [name]. Ronald Foyer, MD, [facility]  Electronically Signed    TDS/MedQ  DD: 08/30/2007  DT: 08/30/2007  Job #: [id number]   cc:   Ronald [name] [facility]

## 2010-10-13 NOTE — Letter (Signed)
June 26, 2007    Miguel Aschoff, M.D.  [address] [place],  [address]   RE:  Ronald [name], Ronald [name]  MRN:  [id number]  /  DOB:  [birth year]   Dear Barbarann Ehlers:   I had the pleasure of seeing Mr. Navarette in the office today in a  followup visit.  Cardiac-wise, he is stable.  He has not been having any  chest pain or significant shortness of breath.  He has seen a  nephrologist and followup, and I know you have done lab work in your  office.  I think it would probably behoove all of [place] if this continued  to be rechecked on at least an every 23-month basis.  We want to make  sure that he is followed closely by [facility].   PHYSICAL EXAMINATION:  VITAL SIGNS:  Today, 160/78, pulse 70.  LUNGS:  The lung fields are clear.  CARDIOVASCULAR:  The cardiac rhythm is regular.  EXTREMITIES:  No significant edema.   His EKG is entirely within normal limits.   He appears stable from a cardiac standpoint.  I know you are watching  his renal function, particularly in light of his type 2 diabetes.  Should he have any problems, please do not hesitate  to let me know.  I would be happy to continue to see him, and we will  plan to see him back in cardiology in 2 months after you have seen him  previously.   Thanks for allowing [place] to share in his care.    Sincerely,      Loretha Brasil. Lia Foyer, MD, [facility]  Electronically Signed    TDS/MedQ  DD: 06/26/2007  DT: 06/27/2007  Job #: [id number]

## 2010-10-13 NOTE — H&P (Signed)
NAME:  Ronald [name], Ronald [name]                ACCOUNT NO.:  [id number]   MEDICAL RECORD NO.:  [id number]          PATIENT TYPE:  INP   LOCATION:  6532                         FACILITY:  [facility]   PHYSICIAN:  Karma Lew, MD          DATE OF BIRTH:  Apr 24, 1946   DATE OF ADMISSION:  07/15/2008  DATE OF DISCHARGE:                              HISTORY & PHYSICAL   CHIEF COMPLAINT:  Chest pain.   HISTORY OF PRESENT ILLNESS:  A 65 year old with past medical history  significant for coronary artery disease status post bypass redo in 2007,  ischemic cardiomyopathy, ejection fraction 40% to 50% by 2D echo in  2006, chronic renal failure with creatinine baseline 1.9 to 2, who  presented to the emergency department complaining of chest pain that  started 6 days prior to admission.  He describes chest pain as pressure  like, intermittent, 5/10 in intensity associated with some mild  shortness of breath and indigestion.  He denies chest pain on exertion.  He is feeling weak and fatigued.  He denies palpitation, fever, or  cough.  The patient relates he has been taking his medications  regularly.   REVIEW OF SYSTEMS:  As per HPI.  All other review of systems are  negative.   ALLERGIES:  He has intolerance to VICODIN, HYDROCHLOROTHIAZIDE, and  PENICILLIN.   PAST MEDICAL HISTORY:  1. Coronary artery disease status post bypass and subsequent redo      bypass in 2007.  2. Ischemic cardiomyopathy, ejection fraction 40% to 50% by 2D echo in      2006.  3. Chronic kidney disease with creatinine 1.9 to 2.  4. Hyperlipidemia.  5. Hypertension.  6. Renal artery stenosis.  7. Gout.  8. Diabetes since last 8 years.  9. Emphysema.   SOCIAL HISTORY:  He was smoking.  He quit 22 years ago.  He used to  smoke 3 packs per day for 20 years.  Alcohol, occasional beer.  Recreational drugs negative.   FAMILY HISTORY:  Father died of heart disease, rheumatic fever.  Mother  MI and a stroke.   MEDICATIONS:  1.  Amlodipine 10 mg once a day.  2. Omeprazole 20 p.o. daily.  3. Actos 15 mg once a day.  4. Metoprolol 50 mg daily.  5. Aspirin 81 mg daily.  6. Multivitamin.  7. Lipitor 20 mg daily.  8. Allopurinol 100 mg.  9. Magnesium oxide 400 mg b.i.d.  10.NitroQuick 4 mg.  11.Rapaflo 4 mg daily.   PHYSICAL EXAMINATION:  VITAL SIGNS:  Blood pressure 127/51, pulse 66,  respirations 19, temperature 98.4, and sats 98% on room air.  GENERAL:  Pleasant man in no acute distress.  CARDIOVASCULAR:  S1 and S2 normal, regular rhythm and rate.  No murmur.  Positive JVD.  LUNGS:  Bilateral good air movement.  No crackles.  No wheezing.  No  rales.  ABDOMEN:  Bowel sounds positive, soft, nontender, and nondistended.  No  rigidity.  EXTREMITIES:  Pulse positive, no edema.  NEURO:  Nonfocal.   LABORATORY DATA:  White blood cell  8.0, hemoglobin 15.7, hematocrit  45.8, and platelets 162.  Sodium 140, potassium 4.3, chloride 106,  bicarb 24, BUN 18, creatinine 1.66, glucose 138, calcium 9.1.  Troponin  0.05, CK-MB 4.0, myoglobin 64.3.   Chest x-ray, pulmonary vascular congestion without edema and  cardiomegaly consistent with mild CHF.   ASSESSMENT AND PLAN:  1. Atypical chest pain in a 65 year old with multiple risk factors of      hypertension, diabetes, hyperlipidemia, known coronary artery      disease status  -  Acute coronary syndrome is highly on the differential, although the  patient has had symptoms for the last week.  EKG without ST changes.  No  ST segment elevation, occasional premature ventricular contraction.  Troponin point-of-care marker negative, 0.05.  We will admit to  telemetry.  We are going to cycle cardiac enzyme and EKG.  We are going  to get TSH.  We are going to start aspirin, metoprolol, nitro, Lipitor,  and heparin drip.  In the differential aortic dissection  less likely.  He has bilateral good pulse.  No widening of the mediastinum on chest x-  ray.  Pneumothorax.  Chest  x-ray was normal.  No free air.  Pulmonary  embolism.  No tachycardia.  No hypoxemia.  No pleuritic chest pain in  nature.  Chest pain has resolved at this moment.  We are going to get  also a 2D echo and the patient may need stress test as discussed,  although with his renal failure, we are going to have to be judicious  considering that.  1. Hypertension, well controlled.  We are going to continue with      metoprolol.  2. Diabetes.  We are going to continue with Actos.  3. Chronic kidney disease.  Creatinine is stable at baseline 1.6.      Niel Hummer, MD  Electronically Signed      Karma Lew, MD  Electronically Signed    BR/MEDQ  D:  07/16/2008  T:  07/16/2008  Job:  [id number]

## 2010-10-13 NOTE — Progress Notes (Signed)
[facility]                        PERIPHERAL VASCULAR OFFICE NOTE   NAME:Ronald [name], Ronald [name]                       MRN:          [id number]  DATE:01/19/2007                            DOB:          1945-11-19    Ronald [name] is seen in followup at the [facility] Peripheral Vascular  Office on January 19, 2007.  Ronald [name] is a very nice 65 year old  gentleman presenting today for followup of renal artery stenosis.  Mr.  [name] has been followed by Dr. Lia Foyer for many years and has had  coronary artery disease and 2 coronary bypass surgeries.  He has had  long-standing hypertension that has been difficult to control.  I saw  him in May 2008, after he underwent a renal ultrasound.  The renal  ultrasound was performed back in January and it demonstrated  atherosclerosis in the abdominal aorta but the renal arteries were not  well visualized.  He was reluctant to proceed with renal angiography at  the time and we elected to perform a CT angiogram of the renal arteries.  This was performed on May 23rd.  He was found to have 3 right renal  arteries and 2 left renal arteries.  The inferior right renal artery has  at least a 75% ostial stenosis.  The middle right renal artery has a  mild ostial plaque and the superior right renal artery is diminutive.  On the left side the superior left renal artery has at least a 75%  diameter stenosis and the inferior renal artery is more diminutive with  a minor ostial plaque.   Ronald [name] has had a fluctuating creatinine over the past several months  and he developed acute renal insufficiency after becoming dehydrated  earlier this summer.  At that time his creatinine rose to nearly 3.  His  most recent creatinine which was drawn on August 8th, was 1.8 with a BUN  of 28.   From a symptomatic standpoint, Ronald [name] has not had any new  complaints.  Overall, he feels well at this time.  He denies chest pain,  edema, orthopnea,  or PND.   CURRENT MEDICATIONS:  1. Lipitor 20 mg at bedtime.  2. Actos 15 mg daily.  3. Multivitamin daily.  4. Aspirin 81 mg daily.  5. Toprol XL 50 mg daily.  6. Omeprazole 20 mg daily.  7. Amlodipine 5 mg daily.   ALLERGIES:  1. PENICILLIN.  2. VICODIN.  3. HYDROCODONE.   PHYSICAL EXAMINATION:  GENERAL:  He is alert and oriented in no acute  distress.  VITAL SIGNS:  Weight is 219 pounds, blood pressure is 159/82, heart rate  is 64, respiratory rate is 16.  HEENT:  Normal.  NECK:  Normal carotid upstrokes without bruits.  Jugular venous pressure  is normal.  LUNGS:  Clear to auscultation bilaterally.  HEART:  Regular rate and rhythm without murmurs or gallops.  ABDOMEN:  Soft, obese, nontender.  No organomegaly.  EXTREMITIES:  No clubbing, cyanosis, or edema.  Peripheral pulses are 2+  and equal throughout.   ASSESSMENT:  Ronald [name] is a 65 year old gentleman  with bilateral renal  artery stenoses.  He has multiple renal arteries but his more dominant  renal arteries appear to have significant ostial stenoses bilaterally.  This is based on CT angiography.   With continued elevated blood pressures in spite of multiple medications  and progressive renal insufficiency, I think it is worthwhile to perform  renal arteriography with an eye toward intervention.  I reviewed the  risks and indications with Ronald [name] and his wife.  I also reviewed  some of the controversy with renal artery intervention.  I believe that  in the setting of bilateral stenoses, refractory hypertension, and  azotemia he has a good indication to proceed.   I plan on admitting Ronald [name] the day before the procedure and  hydrating him aggressively.  We will treat him with acetylcysteine as  well.  We will plan on CO2 aortography, followed by selective renal  angiography and possible PTA.   We will determine followup at the time of his angiogram.     Juanda Bond. Burt Knack, MD  Electronically  Signed    MDC/MedQ  DD: 01/19/2007  DT: 01/20/2007  Job #: [id number]   cc:   Loretha Brasil. Lia Foyer, MD, [facility]  Richard [name]

## 2010-10-13 NOTE — Letter (Signed)
December 19, 2006    Miguel Aschoff, M.D.  [address] [place] [address]   RE:  Ronald [name], Ronald [name]  MRN:  [id number]  /  DOB:  1945/08/05   Dear Barbarann Ehlers,   I had the pleasure of seeing Ronald [name] today in follow-up.  I had him  come in because he had laboratory studies done last week and his  creatinine was up to nearly 3.  He has had prior creatinines earlier in  the year done in your office that was 1.3.  A CT angio was done to  evaluate his renal function and did reveal some evidence some of  bilateral renal artery disease, but he was evaluated by Juanda Bond.  Burt Knack, M.D. in our group who had recommended continued follow-up as it  was not critical on either side and difficult to tell.  Unfortunately,  last week he was out working in the yard, and became fairly dehydrated  over a 2-day period.  These labs were obtained just after that.   He thinks that this may have been partially responsible.  He denies  taking any nonsteroidals in the interim.  We have had him hold his  allopurinol.   PHYSICAL EXAMINATION:  VITAL SIGNS:  Today on examination, blood  pressure was 160/90, repeat by me was 150/80, pulse was 72.  LUNGS:  Lung fields were clear.   PLAN:  We will go ahead and get a repeat BMET now that he has been  drinking fluids over the weekend.  He is now urinating which he did not  do for nearly two days.  He does not have any  urinary retention symptoms, and I am not quite sure what lead to the  marked elevation in creatinine.  Nonetheless, we will go ahead and  repeat this and if it is elevated clearly some type of intervention is  going to be required.  I will call you regarding these findings.  Thanks  for allowing me to share in his care and I will let you know immediately  about the findings.    Sincerely,      Loretha Brasil. Lia Foyer, MD, [facility]  Electronically Signed    TDS/MedQ  DD: 12/19/2006  DT: 12/19/2006  Job #: [id number]

## 2010-10-13 NOTE — Assessment & Plan Note (Signed)
[facility]                            CARDIOLOGY OFFICE NOTE   NAME:Butzin, JOANN SARSOUR                       MRN:          [id number]  DATE:03/28/2007                            DOB:          [birth year]    Mr. Duffy is in for followup.  He is doing extremely well.  He denies  any shortness of breath or chest pain.  I strongly encouraged him to  remain hydrated.  The patient has borderline elevation in creatinine.  He has had an evaluation by the nephrologist, as well as by Dr. Burt Knack.  He has five renal arteries.  The CTA done reveals two left renal  arteries.  The superior left renal artery is partially calcified with a  75% diameter stenosis, and the inferior left renal artery has a 50%  stenosis.  The inferior mesenteric artery remains patent with an ostial  stenosis of greater of than 75 percent.  The right renal arteries  demonstrate 3.  There is a superior most, is diminutive, only a portion  of the upper pole.  The middle right has an ostial plaque of less then  50% in diameter.  The inferior right has a short segmental ostial  stenosis of 75%.  The patient has been seen by the nephrologist in  evaluation.  They have recommended against percutaneous intervention.  They have been checking his basic metabolic profile.   MEDICATIONS:  Currently include:  1. Lipitor 20 mg q.h.s.  2. Actos 15 mg daily.  3. Multivitamin daily.  4. Aspirin 81 mg daily.  5. Toprol XL 50 daily.  6. Omeprazole 20 mg daily.  7. Amlodipine 10 mg daily.  8. Allopurinol 100 mg daily.  9. Colchicine 0.6 daily.   PHYSICAL EXAMINATION:  GENERAL:  He is alert and oriented, in no acute  distress.  VITAL SIGNS:  The blood pressure is 130/80.  The pulse is 70.  LUNGS:  Fields are clear.  CARDIAC:  Rhythm is regular.   The electrocardiogram demonstrates normal sinus rhythm.  There is left  atrial enlargement.  There is a borderline ECG.   IMPRESSION:  1. Coronary artery  disease with prior revascularization surgery.  2. Non-insulin dependent diabetes mellitus.  3. Bilateral renal artery stenosis with mild renal insufficiency.      Please see above discussion.  4. Hypercholesterolemia, followed by Dr. [name].   PLAN:  1. I have encouraged he and his wife to make sure that they get her      laboratory studies drawn on a regular basis, at least on an every      three month protocol.  2. I have strongly recommended that he remain excessively hydrated.  3. I have recommended that he follow up closely with Dr. [name] for      followup of both his renal function, as well as his lipid profile.  4. He is to call us if there is any change in his status.  5. He should keep track of his blood pressures, which have been better      controlled recently.  Loretha Brasil. Lia Foyer, MD, [facility]  Electronically Signed    TDS/MedQ  DD: 03/28/2007  DT: 03/29/2007  Job #: [id number]   cc:   Miguel Aschoff

## 2010-10-13 NOTE — Letter (Signed)
December 21, 2007    Miguel Aschoff, MD  [address], [address] [place], [place]  [address]   RE:  CAYD, REVEL  MRN:  [id number]  /  DOB:  [birth year]   Dear Barbarann Ehlers:   I had the pleasure seeing [facility] in the office today in a followup  visit.  From a cardiac standpoint, he seems to be getting along  extremely well.  He denies current chest pain.  He has been really quite  stable from our standpoint.  He has a known ejection fraction in the 40-  50% range and as you know he had a redo bypass surgery in March 2007  with saphenous vein grafts to the posterior descending and vein graft to  the obtuse marginal.  He has had recurrent gout.  He has been followed  by Dr. Erling Cruz and I know by your office.   His current medications include:  1. Allopurinol 100 mg daily.  2. Colchicine 0.6 mg daily.  3. Lipitor 20 mg nightly.  4. Actos 15 mg daily.  5. Multivitamin daily.  6. Aspirin 81 mg daily.  7. Toprol-XL 50 mg daily.  8. Omeprazole 20 mg daily.  9. Amlodipine 10 daily.   Today, on examination, he is very alert and oriented.  The weight is 228  pounds, blood pressure 147/76, and pulse is 76.  Lung fields are clear.  Cardiac rhythm is regular.   The electrocardiogram is entirely within normal limits.   I had several thoughts regarding Mr. Burningham.  First, I have suggested  that he have followup in your office and repeat basic metabolic profile  as this has not been done in some time.  He apparently had one at one  point with Dr. Florene Glen in the last 6 months, but the patient is not aware  of the results.  Secondly, I did suggest him that he stop his  colchicine.  He has not had any recurrence of gout since shortly after  his bypass surgery, and the colchicine is a low-dose.  Moreover, with  potential for drug interaction with statins, I thought it would be  appropriate for him to stop this unless he had recurrence of symptoms.  Finally, he remains stable from a cardiac  standpoint, so we do not need  to see him for at least 6 months.  Should he have any problems in the  interim, please do not hesitate to let me know, and I will be happy to  see him at any time.    Sincerely,      Loretha Brasil. Lia Foyer, MD, [facility]  Electronically Signed    TDS/MedQ  DD: 12/21/2007  DT: 12/22/2007  Job #: [id number]

## 2010-10-13 NOTE — H&P (Signed)
NAME:  Ronald [name], Ronald [name]                ACCOUNT NO.:  [id number]   MEDICAL RECORD NO.:  [id number]          PATIENT TYPE:  INP   LOCATION:  6532                         FACILITY:  [facility]   PHYSICIAN:  Loretha Brasil. Lia Foyer, MD, FACCDATE OF BIRTH:  1945/10/26   DATE OF ADMISSION:  07/15/2008  DATE OF DISCHARGE:  07/17/2008                              HISTORY & PHYSICAL   CHIEF COMPLAINT:  Chest discomfort.   HISTORY OF PRESENT ILLNESS:  [name] is well-known to me.  He is a  delightful 65 year old gentleman who has previously had  revascularization surgery on two separate occasions, most recently this  was in 2007.  He was recently admitted in mid February with an episode  of chest discomfort.  There was some typical and atypical features of  this.  However, he was able to walk for nearly 8 minutes on the  treadmill, and had no definite chest pain or EKG change.  He did have  some PVCs.  Radionuclide imaging study did suggest some mild lateral  ischemia in the mid lateral wall and redistribution in the upper lateral  wall.  Because of the lack of discomfort on the treadmill and normal EKG  changes, as well as known renal insufficiency, we elected to watch him  medically.  In the interim, he has now had a endoscopy which according  to the patient was fairly unremarkable.  I spoke with him last week, and  he had had some discomfort when he went home but then it did resolve  completely, however, in the last week he has had another episode of  discomfort, particularly on Wednesday night, and because of this we felt  that we should probably go ahead and recommend a cardiac  catheterization.  He does complain of some shortness of breath.  His  last echocardiogram was done here in February.  At that time, the EF was  50-55% with an LV systolic function at the lower limits of normal.  No  definite wall motion abnormalities were noted.  There was upper normal  wall thickness.  He denies current  ongoing chest discomfort over the  past several days, but in light of this, I felt that it would be best to  go ahead and recommend an admission for catheterization if his  creatinine can be stabilized.  The patient is agreeable.   PAST MEDICAL HISTORY:  1. Coronary artery bypass graft surgery with subsequent redo in 2007.  2. Chronic kidney disease with creatinines ranging from 1.3 to 2.  3. Probable bilateral renal artery stenosis managed by Nephrology.  4. Hyperlipidemia.  5. Hypertension.  6. History of recurrent gout.  7. Non-insulin dependent diabetes.  8. Emphysema.   SOCIAL HISTORY:  He was a smoker but he quit 22 years ago.  He has a  rare beer.   FAMILY HISTORY:  His father died of heart disease.  His mother had an MI  and stroke as well.   MEDICATIONS:  1. Amlodipine 10 mg daily.  2. Omeprazole 20 mg daily.  3. Actos 50 mg daily.  4. Metoprolol 50  mg.  5. Toprol 50 daily.  6. Aspirin 81 mg daily.  7. Multivitamin.  8. Lipitor 20 daily.  9. Allopurinol 100 mg daily.  10.Magnesium oxide 400 b.i.d.  11.Nitroglycerin p.r.n.  12.Rapaflo 4 mg daily.   ALLERGIES:  PENICILLIN, VICODIN, HYDROCODONE AND HYDROCHLOROTHIAZIDE.   REVIEW OF SYSTEMS:  Negative for bleeding in the stool, hemoptysis,  cough.  He does get short of breath when he lays down at night and this  has been slightly more prominent lately.   Recent chest x-ray revealed bilateral apical scarring and emphysematous  changes and prior cholecystectomy.   PHYSICAL EXAMINATION:  GENERAL:  Today, he was alert and oriented in no  distress.  VITAL SIGNS:  Blood pressure is 130/70, pulse 74, the jugular veins are  not distended.  LUNGS:  The lung fields actually are fairly clear to auscultation and  percussion.  The PMI is nondisplaced.  There is an S4 gallop.  ABDOMEN:  Protuberant but soft without obvious hepatosplenomegaly.  Femoral pulses  are intact bilaterally.  Distal pulses are noted as well.  I do  not  appreciate any aortic regurgitation murmur.  HEENT:  Otherwise unremarkable.   EKG today reveals normal sinus rhythm is within normal limits.   IMPRESSION:  1. Coronary artery disease status post coronary artery bypass graft      surgery last operative report with redo median sternotomy with      three grafts using saphenous vein graft to the diagonal, saphenous      vein graft to the obtuse marginal, and saphenous vein graft to the      posterior descending with previously known intact internal mammary      vessel.  2. Hypertension.  3. Hypercholesterolemia.  4. History of renal artery stenosis.  5. Elevated creatinine.  6. Recent abnormal myocardial perfusion imaging.  7. Prior cholecystectomy.   RECOMMENDATIONS:  1. Admission to the hospital.  2. Basic metabolic profile.  3. Gentle hydration with plans for cardiac catheterization tomorrow.      My hope had been to hold off on cardiac catheterization, but with      his recurrent symptoms, and abnormal perfusion imaging I think we      should go ahead      and admit him, get gentle hydration, monitor his creatinine      closely, and then plan for a somewhat limited study.  We may elect      to do a distal aortogram to look at the renal arteries while we      were in there.  I will try to limit the contrast, and the patient's      family are aware of the potential risks.      Loretha Brasil. Lia Foyer, MD, [facility]  Electronically Signed     TDS/MEDQ  D:  08/19/2008  T:  08/19/2008  Job:  [id number]

## 2010-10-13 NOTE — Cardiovascular Report (Signed)
NAME:  Ronald [name], Ronald [name]                ACCOUNT NO.:  [id number]   MEDICAL RECORD NO.:  [id number]          PATIENT TYPE:  INP   LOCATION:  4703                         FACILITY:  [facility]   PHYSICIAN:  Ronald Brasil. Lia Foyer, MD, FACCDATE OF BIRTH:  05-15-1946   DATE OF PROCEDURE:  08/20/2008  DATE OF DISCHARGE:                            CARDIAC CATHETERIZATION   INDICATIONS:  Ronald [name] is a 65 year old gentleman well-known to me.  He recently presented with some atypical chest pain that sounded GI.  He  had an exercise tolerance test demonstrated no ischemia in stage III but  with PVCs.  He did have a mildly abnormal nuclear study suggesting some  lateral ischemia.  We elected to treat him clinically initially in part  because of modest renal insufficiency.  We elected to monitor him.  He  recently has had some more symptoms, and I elected to bring him in for  further evaluation.  We brought him in a day early for hydration  overnight specifically because of his elevated creatinine and known  renal artery stenosis in the setting of diabetes.  As a result, we  elected to proceed with further evaluation.   PROCEDURE:  1. Left heart catheterization.  2. Selective coronary arteriography.  3. Selective left ventriculography.  4. Saphenous vein graft angiography.  5. Selective left internal mammary angiography.  6. Distal aortography.   DESCRIPTION OF THE PROCEDURE:  The patient was brought to the  Catheterization Laboratory and prepped and draped in the usual fashion.  Through an anterior puncture, the right femoral artery was entered and a  5-French sheath was placed.  We used a 3.5 diagnostic beginning in the  left main.  All the vein grafts were injected.  One of the rings was  missing from the original surgery and has been replaced by a separate  ring.  The internal mammary was injected with an internal mammary  catheter.  Because of his renal artery stenosis, I did elect to do a  distal  aortogram with least minimal contrast.  He tolerated the  procedure well.  He was well hydrated prior to the procedure with a  creatinine of 1.3, and he will be maintained with excellent hydration  following.  At the completion of the procedure, I reviewed the films  with his wife.   HEMODYNAMIC DATA:  1. The left main is free of critical disease.  2. The LAD courses distally and basically provides two diagonals.  The      second diagonal itself has probably about a 70-80% area of focal      stenosis, but the distal vessel was relatively small.  Whether this      accounts for the lateral ischemia is uncertain, but certainly could      be a potential source.  3. The internal mammary to the LAD distally is widely patent.  4. The circumflex is totally occluded proximally.  5. There is a new graft that represents basically a Y-graft.  Part of      the Y goes into the obtuse marginal and part into the diagonal.  Both limbs appear to be patent with typical changes seen in veins,      but without significant narrowing.  Runoff into the diagonal and OM      both appear to be excellent.  6. The old graft to the diagonal was occluded.  7. The old graft to the OM is no longer visible although the stent      remains.  There is some retrograde filling of this graft during the      injection of the new vein graft to the OM.  8. The right coronary artery has 80% proximal narrowing that is      totally occluded and then there is some bridging to the distal      vessel.  9. A new saphenous vein graft to the PDA appears widely patent.  It      does retrograde fill onto the distal posterolateral segment.  10.The saphenous vein graft to the crux is an old graft.  It has some      ostial narrowing of about 30-40% followed by widely patent stent.      The graft then opens up and has about 40-50% narrowing in the more      distal portion of the graft before it inserts.  After its      insertion, there  is some mild narrowing, but this could be      partially related to competitive filling from the new vein graft to      the PDA.  11.Ventriculography was not performed and distal aortography was      performed.  There was mild atherosclerotic change in the aorta but      no evidence of high-grade obstruction of the mid aorta.  The      contrast load itself was fairly limited.  There may be a dual      supply to the renal arteries bilaterally.  The superior branch on      the left appears to have some ostial narrowing of up to 70-80% and      the inferior branch on the right may have some ostial narrowing as      well.  We will compare these to the renal ultrasound studies.   CONCLUSIONS:  1. Patent internal mammary to the LAD.  2. Patent new saphenous vein graft which is a Y-graft to the diagonal      and OM.  3. Patent saphenous vein graft to the PDA.  4. Patent old saphenous vein graft to the PDA with 50-60% mid-      narrowing.  5. Twin renal arteries bilaterally with some ostial narrowing on each      side.   DISPOSITION:  1. The patient will follow up with me in the office.  2. We will hydrate him aggressively overnight.  Recheck his creatinine      in the morning.  3. We will check a D-dimer.      Ronald Brasil. Lia Foyer, MD, [facility]  Electronically Signed     TDS/MEDQ  D:  08/20/2008  T:  08/21/2008  Job:  [id number]

## 2010-10-13 NOTE — Assessment & Plan Note (Signed)
 HEALTHCARE                            CARDIOLOGY OFFICE NOTE   NAME:Ronald [name], Ronald [name]                       MRN:          [id number]  DATE:06/25/2008                            DOB:          1945/12/14    Mr. Filley is in for a followup visit.  In general, he is getting along  reasonably well.  He has not been having any ongoing chest pain or  shortness of breath.  He had a recent laboratory work done by Dr.  [name], he was told that his creatinine was stable.  Mrs. Rodino  remembers 1.87 as a creatinine level.  The patient has not had  progressive ischemic-type chest pain.   MEDICATIONS:  1. Allopurinol 100 mg daily.  2. Lipitor 20 mg at bedtime.  3. Actos 15 mg daily.  4. Multivitamin daily.  5. Aspirin 81 mg daily.  6. Toprol-XL 50 mg daily.  7. Omeprazole 20 mg daily.  8. Amlodipine 10 mg daily.  9. Rapaflo 0.4 daily.   PHYSICAL EXAMINATION:  GENERAL:  He is alert and oriented in no  distress.  VITAL SIGNS:  Blood pressure 114/60, pulse is 71.  LUNGS:  The lung fields are clear.  CARDIAC:  The cardiac rhythm is regular.  EXTREMITIES:  Revealed virtually no edema.  ABDOMEN:  The median sternotomy has healed.   The electrocardiogram is entirely within normal limits.   IMPRESSION:  1. Coronary artery disease with prior coronary artery bypass graft      surgery and subsequent redo bypass surgery in March 2007.  2. Mild ischemic cardiomyopathy.  3. Acute renal insufficiency previously with moderate chronic kidney      disease.  4. Hyperlipidemia.  5. Hypertension.  6. Allergy or intolerance to VICODIN, PENICILLIN, and NITRATES.  7. History of gout.  8. Renal artery stenosis.   PLAN:  1. Continue followup with Dr. [name].  2. Aggressive management of diabetes.  3. Continue close observation of the patent's renal function.  He does      have bilateral renal artery disease.  We are reluctant to do a      renal arterial scan with CT  and the ultrasound has been      nondiagnostic in the past due to poor imaging characteristics.  We      will continue to follow closely, at the present time no further      evaluation is warranted unless there is a change in his status.   ADDENDUM:  EKG reveals normal sinus rhythm within normal limits.     Loretha Brasil. Lia Foyer, MD, [facility]  Electronically Signed    TDS/MedQ  DD: 06/25/2008  DT: 06/26/2008  Job #: [id number]

## 2010-10-16 NOTE — Assessment & Plan Note (Signed)
[facility]                            CARDIOLOGY OFFICE NOTE   NAME:Ronald [name]                       MRN:          [id number]  DATE:06/13/2006                            DOB:          Feb 12, 1946    Ronald [name] is in for a follow-up visit.  He is stable.  He has not  been having any ongoing chest pain or shortness of breath.  He was  admitted to the hospital with an episode of chest pain and subsequently  discharged.  At that time his creatinine was elevated.  His lisinopril  was stopped and he was sent home on amlodipine in its place.  Importantly, he came back and had an adenosine Cardiolite which showed  no definite ischemia.  He did have a reduced overall EF.  Importantly,  he was also noted to have duplex imaging of the kidneys, though the left  kidney seemed to be somewhat smaller than the right kidney at 7.8 versus  10.2 cm.  The renal arteries were not well-seen.   CURRENT MEDICATIONS:  1. Lipitor 20 mg q.h.s.  2. Actos 15 mg daily.  3. Colchicine 0.6 mg p.o. b.i.d.  4. Allopurinol 100 mg two tablets daily.  5. Multivitamin daily.  6. Aspirin 81 mg.  7. Toprol XL 50 mg.  8. Omeprazole 20 mg daily.  9. Amlodipine 5 mg daily.   PHYSICAL EXAMINATION:  GENERAL:  He is an alert, oriented gentleman in  no acute distress.  ABDOMEN:  I do not hear abdominal bruits.  The abdomen is soft without  obvious hepatosplenomegaly.  LUNGS:  The lung fields are clear to auscultation and percussion.  There  is a prominent S4 gallop.  EXTREMITIES:  No edema.   IMPRESSION:  1. Differential size of the kidneys without ability to see renal      arteries on ultrasound.  2. Coronary artery disease, status post redo coronary artery bypass      graft surgery.  3. Hypertension.  4. Hyperlipidemia.  5. Type 2 diabetes.  6. Elevated creatinine on recent admission.   PLAN:  1. Recheck BMET.  2. Return to clinic in 3 months.  3. If BMET is elevated, we  may go ahead and recommend a renal scan to      differentiate flow in the two arteries.     Loretha Brasil. Lia Foyer, MD, [facility]  Electronically Signed    TDS/MedQ  DD: 06/13/2006  DT: 06/14/2006  Job #: [id number]   cc:   Miguel Aschoff

## 2010-10-16 NOTE — Op Note (Signed)
NAME:  Ronald [name], Ronald [name]                ACCOUNT NO.:  [id number]   MEDICAL RECORD NO.:  [id number]          PATIENT TYPE:  INP   LOCATION:  2312                         FACILITY:  [facility]   PHYSICIAN:  Gilford Raid, M.D.     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   OPERATIVE PROCEDURE:  Redo median sternotomy, extracorporeal circulation,  redo coronary bypass graft surgery x3 using a saphenous vein graft to the  diagonal branch of the LAD, saphenous vein graft to the obtuse marginal  branch of the left circumflex coronary, and a saphenous vein graft to the  posterior descending coronary. Endoscopic vein harvesting from the left leg.   ATTENDING SURGEON:  Dr. Gilford Raid.   ASSISTANT:  Suzzanne Cloud, PA-C   ANESTHESIA:  General endotracheal.   CLINICAL HISTORY:  This patient is a 66 year old gentleman who underwent  coronary bypass x4 in 1996.  At that time I performed a left internal  mammary graft to the LAD and saphenous vein graft to the diagonal, obtuse  marginal, and distal right coronary. He has subsequently developed  progressive native coronary disease as well as saphenous vein graft disease  and undergone several percutaneous interventions since his original surgery.  He now presents with unstable anginal symptoms. Cardiac catheterization  showed a patent left internal mammary graft to the LAD. The proximal LAD was  occluded. The left circumflex was occluded. There was a saphenous vein graft  to the obtuse marginal that had a segmental high-grade stenosis in it. The  saphenous vein graft to the diagonal branch also had severe diffuse high-  grade stenosis. The right coronary artery was occluded. The saphenous  vein  graft to the distal right coronary artery was also severely diseased and had  a stent in the ostium placed in the past which was stenosed. This overall  left ventricular function was well-preserved. After review of the angiogram  and examination of the patient it was felt that redo coronary bypass graft  surgery is the best treatment to prevent further ischemia and infarction. I  discussed the operative procedure with the patient and his wife including  alternatives, benefits, risks including bleeding, blood transfusion,  infection, stroke, myocardial infarction, graft failure, and death. They  understood and agreed to proceed. Preoperative upper extremity arterial  Dopplers showed a decrease in the palmar arch signal with radial artery  compression and therefore I did not feel it was safe to harvest a radial  artery. The patient was noted at his first surgery to have very large  temperature emphysematous lungs that approached the midline. I did not feel  that a right internal mammary graft would reach any of his coronary arteries  as a pedicle in situ graft. I considered using this as a free graft to one  of his arteries but was concerned about the  size of the mammary artery and  the benefit of having to use it as a free graft. I was also concerned about  to decreasing blood flow to his sternum and therefore did not use his  mammary artery.   OPERATIVE PROCEDURE:  The patient taken operating room, placed on table in  supine position. After induction of general endotracheal anesthesia a Foley  catheter was placed in bladder using sterile technique. Then the chest,  abdomen and both lower extremities were prepped and draped in usual sterile  manner. The chest was reentered through a previous median sternotomy  incision. The bone was opened with the oscillating saw without difficulty.  Bone hooks were used to retract the sternum and the heart and mediastinal  tissues were dissected  from the back of the sternum. The chest retractor was  placed. At the same time, a segment of greater saphenous vein was harvested  from the left leg using endoscopic vein harvest technique. This vein was of  medium size and good quality with slightly thickened walls.   Then dissection was performed to expose the right atrium and ascending  aorta. The old vein grafts were identified and carefully preserved and care  taken not to manipulate them. The patient was heparinized. When an adequate  after contour was achieved, the distal ascending aorta was cannulated using  a 20-French aortic cannula for arterial inflow. Venous outflow was achieved  using a two-stage venous cannula in the right atrium. An antegrade  cardioplegia and vent cannula was inserted in the aortic root. A retrograde  cardioplegia cannula was inserted through the right atrium into the coronary  sinus.   The patient placed on cardiopulmonary bypass and the remainder of the heart  dissected free from the pericardium. The distal coronaries were identified.  The LAD was a large vessel. The left internal mammary graft was identified  and traced backward to its entry point into the pericardium anterior to the  phrenic nerve. It was encircled here and carefully preserved. The diagonal  branch was small but graftable. The obtuse marginal was a medium size  graftable vessel. The distal right coronary was involved with calcific  plaque and therefore, the posterior descending vessel was identified  proximally. This was a medium-sized vessel with minimal disease in it.   Then an atraumatic vascular clamp was placed on the mammary pedicle. The  aorta was crossclamped and 300 mL of cold blood antegrade cardioplegia was  administered in the aortic root with a slow arrest of the heart. This was  followed by 300 mL of cold blood retrograde cardioplegia with quick arrest the heart. Systemic hypothermia to 30 degrees centigrade and  topical  hypothermia with iced saline was used. Temperature probe placed in septum  insulating pad in the pericardium. Additional doses of retrograde  cardioplegia were given about 20 minute intervals to maintain myocardial  temperature around 10 degrees centigrade.   Then the first distal anastomosis was performed to the obtuse marginal  branch. The internal diameter was about 1.75 mm. Conduit used was a segment  of greater saphenous vein.  The anastomosis performed in end-to-side manner  continuous 7-0 Prolene suture. Flow was measured through the graft and was  excellent.   Second distal anastomosis was performed to the posterior descending coronary  artery. The internal diameter of this vessel was about 1.6 mm. Conduit used  was a second segment greater of saphenous vein. The anastomosis was  performed in end-to-side manner using continuous 7-0 Prolene suture.  Flow  was measured through the graft and was excellent.   Then the third distal anastomosis was performed to the diagonal branch. The  internal diameter vessel was about 1.5 mm. Conduit used was a third segment  of greater saphenous vein and anastomosis performed in end-to-side manner  using continuous 7-0 Prolene suture. Flow was measured through the graft and  was excellent.   Then with the crossclamp in place, the proximal anastomosis of the obtuse  marginal and the posterior descending vein graft were performed directly to  the aortic root in end-to-side manner using continuous 6-0 Prolene suture.  The proximal anastomosis of the diagonal graft was performed to the proximal  portion of the obtuse marginal vein graft in end-to-side manner using  continuous 7-0 Prolene suture. This was done due to the relatively short  ascending aorta and the old vein grafts of which were taking up considerable  space on the aortic root and were calcified around the ostia precluding use  of these areas for new proximal anastomoses. Then  the clamp was removed from  the mammary pedicle. There is rapid warming of the ventricular septum and  return of spontaneous ventricular fibrillation. Crossclamp removed with a  time 89 minutes. The patient spontaneously converted to sinus rhythm. The  proximal and distal anastomoses appeared hemostatic and line of the grafts  satisfactory. Graft markers placed around the proximal anastomoses. Two  temporary right ventricular and right atrial pacing wires placed and brought  out through the skin.   When the patient had rewarmed to 37 degrees centigrade, he was weaned from  cardiopulmonary bypass on low-dose dopamine. Total bypass time was 126  minutes. Cardiac function appeared excellent with a cardiac output of 6  liters minute. Protamine was given and the venous and there aortic cannulas  removed without difficulty. Hemostasis was achieved. Two chest tubes were placed with a tube in the posterior pericardium and one in the anterior  mediastinum. The sternum was then closed with double #6  stainless steel  wires. Fascia was closed with continuous #1 Vicryl suture. Subcu tissue was  closed with continuous 2-0 Vicryl and skin with 3-0 Vicryl subcuticular  closure. The lower extremity vein harvest site was closed in layers in  similar manner. The sponge, needles and instrument counts correct according  to scrub nurse. Dry sterile dressing applied over the incisions around the  chest tubes which were hooked to Pleur-Evac suction. The patient remained  hemodynamically stable, transferred to the SICU in guarded but stable  condition.      Gilford Raid, M.D.  Electronically Signed     BB/MEDQ  D:  08/02/2005  T:  08/03/2005  Job:  [id number]   cc:   [facility] office   [facility]   Cath lab

## 2010-10-16 NOTE — Cardiovascular Report (Signed)
NAME:  Ronald [name], Ronald [name]                          ACCOUNT NO.:  [id number]   MEDICAL RECORD NO.:  [id number]                   PATIENT TYPE:  OIB   LOCATION:  6532                                 FACILITY:  [facility]   PHYSICIAN:  Ethelle Lyon, M.D. [facility]         DATE OF BIRTH:  10/08/1945   DATE OF PROCEDURE:  11/19/2003  DATE OF DISCHARGE:  11/19/2003                              CARDIAC CATHETERIZATION   PROCEDURES:  Drug-eluting stent placement in both the saphenous vein graft  to the obtuse marginal and a saphenous vein graft to the right coronary  artery both using filter wire distal protection.   CARDIOLOGIST:  Ethelle Lyon, M.D.   INDICATIONS:  [name] is a 65 year old gentleman who presents with  coronary artery disease status post coronary artery bypass grafting in 1996  who suffered increased exertional angina over the past several weeks.  He  has had no discomfort at rest.  On November 15, 2003, he underwent cardiac  catheterization by Dr. Percival Spanish.  This demonstrated severe stenosis in the  proximal portion of the saphenous vein graft to the obtuse marginal and  severe stenosis in the proximal portion of the saphenous vein graft to the  right coronary artery.  Ejection fraction was 65%.  He was initiated on  Plavix and returns today for planned intervention on both these lesions.   PROCEDURAL TECHNIQUE:  Informed consent was obtained.  Under 1% lidocaine  local anesthesia, a 6 French sheath was placed in the right common femoral  artery using modified Seldinger technique.  Anti-coagulation was initiated  with heparin to achieve and maintain an ACT of greater than 250 seconds.   Attention was first turned to the vein graft to the RCA.  A JL4 guide was  advanced over a wire and engaged in the ostium of this graft.  The filter  wire was advanced into the body of the graft.  The filter was deployed.  Apposition was confirmed in two orthogonal views.  The lesion was then  directly stented using a 2.5 x 16 mm Taxus at 16 atmospheres.  All but the  distal most margin of this stent was then post dilated using a 3.0 mm  PowerSail at 16 atmospheres.  The filter was then captured and wire removed.  Angiography demonstrated no residual stenosis and TIMI-3 flow to the distal  vasculature.   Attention was then turned to the vein graft to the obtuse marginal.  An AL-1  guide was advanced over a wire and engaged in the ostium of this graft.  I  was unable to pass the filter wire into the lesion.  Therefore, I first  advanced the luge wire across the lesion and used it as a buddy wire.  With  this support, I was able to advance the filter wire across the lesion  without significant difficulty.  The filter was positioned in the body of  the graft and  deployed.  Complete apposition was confirmed in two orthogonal  views.  The lesion was then directly stented using a 3.5 x 12 mm Taxus at 18  atmospheres.  The filter was then captured using the retrieval sheath and  wire was removed.  Final angiogram demonstrated no residual stenosis and  TIMI-3 flow to the distal vasculature.  The patient tolerated the procedure  well and was transferred to the holding room in stable condition.   COMPLICATIONS:  None.   IMPRESSION/PLAN:  Successful drug eluting stent placement in the saphenous  vein graft to the obtuse marginal and also the saphenous vein graft to the  right coronary artery.  Plavix should be continued for a minimum of 6  months.  Aspirin should be continued indefinitely.                                               Ethelle Lyon, M.D. [facility]    WED/MEDQ  D:  11/18/2003  T:  11/19/2003  Job:  [id number]   cc:   Loretha Brasil. Lia Foyer, M.D. [facility]  [address]  [place] [address]  Fax: [phone]

## 2010-10-16 NOTE — Discharge Summary (Signed)
NAME:  Ronald [name], Ronald [name]                ACCOUNT NO.:  [id number]   MEDICAL RECORD NO.:  [id number]          PATIENT TYPE:  INP   LOCATION:  6532                         FACILITY:  [facility]   PHYSICIAN:  Loretha Brasil. Lia Foyer, MD, FACCDATE OF BIRTH:  [birth year]   DATE OF ADMISSION:  07/15/2008  DATE OF DISCHARGE:  07/17/2008                               DISCHARGE SUMMARY   PRIMARY CARDIOLOGIST:  Marcello Moores D. Lia Foyer, MD, [facility]   DISCHARGE DIAGNOSIS:  Chest pain.  The patient ruled out for myocardial  infarction by serial cardiac enzymes and EKG this admission, also stress  Myoview.  The patient underwent an exercise Myoview on July 17, 2008.  The patient found to have normal left ventricular systolic  function and reversible perfusion identified involving the apical, mid,  and basilar segments of the lateral wall.   PAST MEDICAL HISTORY:  1. Coronary artery disease, status post bypass with a redo in 2007.  2. Ischemic cardiomyopathy with EF previously 40% by echocardiogram in      2006.  3. Chronic kidney disease with a creatinine of 1.9-2.  4. Dyslipidemia.  5. Hypertension.  6. Renal artery stenosis.  7. Gout.  8. Diabetes.  9. COPD.   HOSPITAL COURSE:  Mr. Chopin is a pleasant 65 year old Caucasian  gentleman with known history of coronary artery disease who presented on  the day of admission complaining of chest discomfort that started 6 days  prior to admission.  He had symptoms suggestive of GERD with burning  taste in the back of throat.  The patient was admitted by Dr. Sherren Mocha,  cardiology fellow.  A 12-lead EKG without acute ST-T wave changes.  The  patient was placed on aspirin, metoprolol, nitroglycerin, Lipitor, and  heparin drip.  Chest x-ray normal per Dr. Honor Junes documentation.  Blood  pressure was felt to be well controlled.  A 2-D echocardiogram obtained  showed EF of 50-55%.  Trivial aortic valvular regurgitation noted.  The  patient, who routinely exercises daily, had not  experienced any type of  chest discomfort with exertion.  It was decided to proceed with a stress  Myoview.  The patient completed an exercise Myoview under the Bruce  protocol.  His only complaint was of some leg discomfort at stage III.  He was noted to have significant ventricular bigeminy during recovery  phase that resolved.  Dr. Lia Foyer in to see the patient on day of  discharge.  The patient is stable to be discharged home and follow up  outpatient.  He already has an appointment scheduled on September 23, 2008,  at 4:30.   At time of discharge, the patient's medications include:  1. Amlodipine 10 mg daily.  2. Omeprazole 20 mg daily.  3. Actos 50 mg daily.  4. Metoprolol 50 mg daily.  5. Aspirin 81 mg daily.  6. Multivitamin daily.  7. Lipitor 20 daily.  8. Allopurinol 100 mg daily.  9. Magnesium oxide 400 mg b.i.d.  10.Nitroglycerin p.r.n.  11.Rapaflo 4 mg daily.   DURATION OF DISCHARGE ENCOUNTER:  Around 30 minutes.      Rosanne Sack,  ACNP      Loretha Brasil. Lia Foyer, MD, [facility]  Electronically Signed    MB/MEDQ  D:  07/18/2008  T:  07/19/2008  Job:  [id number]

## 2010-10-16 NOTE — Cardiovascular Report (Signed)
NAME:  Ronald [name], Ronald [name]                ACCOUNT NO.:  [id number]   MEDICAL RECORD NO.:  [id number]          PATIENT TYPE:  INP   LOCATION:  3705                         FACILITY:  [facility]   PHYSICIAN:  Loretha Brasil. Lia Foyer, M.D. [facility] OF BIRTH:  1946/01/14   DATE OF PROCEDURE:  05/05/2004  DATE OF DISCHARGE:                              CARDIAC CATHETERIZATION   INDICATIONS:  Mr. Bick is a 65 year old gentleman who is well known to [place].  He has had prior coronary artery bypass graft surgery.  He has had stenting  of two vein grafts earlier in the year.  He currently presents with some  recurrent chest pain and the study was done to assess previous evaluation.   PROCEDURES:  1.  Left heart catheterization.  2.  Selective coronary arteriography.  3.  Selective left ventriculography.  4.  Saphenous vein graft angiography x3.  5.  Selective left internal mammary angiography x1.   DESCRIPTION OF PROCEDURE:  The patient was brought to the catheterization  lab and prepped and draped in the usual fashion.  Through an anterior  puncture, the  right femoral artery was easily entered.  A 6-French sheath  was placed.  Because of the patient's hypertension, he was given doses of  intravenous labetalol x2 and intravenous metoprolol.  He tolerated all of  this well.  The pressure came down somewhat.  Overall, he tolerated the  procedure well.  I reviewed the films with Dr. Elta Guadeloupe Pulsipher.  We  recommended a continued medical approach.  He was taken to the holding area  for direct manual hemostasis and compression.   HEMODYNAMIC DATA:  1.  Central aorta:  194/100.  2.  Left ventricle:  200/18.  3.  No gradient on pullback across the aortic valve.   ANGIOGRAPHIC DATA:  1.  The left main coronary artery was free of critical disease.  To engage      the left main, we required a JL-3.0 guiding catheter.  2.  The left anterior descending artery has two small diagonal branches.      The second one has a  70% area of stenosis.  The LAD proper has 90%      narrowing with competitive filling distally.  3.  The distal left anterior descending is supplied nicely by an internal      mammary graft which is widely patent.  4.  There is a saphenous vein graft to the diagonal branch which has not      been previously stented.  This is somewhat small in caliber.  There are      some diffuse areas of narrowing of about 50% in the mid and in the mid      distal of about 50-70%.  When compared to the previous study this looks      about the same, or there could be minimal progression in the distal      lesion.  5.  The circumflex proper is totally occluded.  6.  There is a saphenous vein graft to the distal marginal.  This has been  previously stented at the ostium with about 40% renarrowing, but a      widely patent lumen.  There is a 50% focal stenosis in the distal      vessel, but this does not appear to be flow limiting.  The vessels fills      retrograde graft and then into the AV circumflex.  There is a 90%      stenosis leading into this area of the AV circumflex.  7.  The right coronary artery is totally occluded in its mid portion with      bridging collaterals to the distal vessel.  8.  The vein graft to the distal right is intact.  At the previous stent      site, there is about eccentric 50% narrowing, but this does not appear      to be flow limiting.  9.  Ventriculography in the RAO projection reveals vigorous global systolic      function.  No segmental abnormalities, contraction are identified.  10. The native right coronary beyond the graft insertion does have about      40%, maybe 50% area of narrowing at the proximal portion of the PDA      although again this does not appear to be flow limiting.  The posterior      lateral system is relatively small and without critical narrowing.   CONCLUSION:  1.  Well-preserved left ventricular function.  2.  Continued patency of the  saphenous vein graft to the obtuse marginal at      the ostium.  3.  Continued patency of the saphenous vein graft to the distal right with      mild partial restenosis.  4.  Continued patency of the internal mammary to the left anterior      descending.  5.  Segmental diffuse disease of the saphenous vein graft to the diagonal      with outline as above without high grade critical stenosis.   DISPOSITION:  I have reviewed the films with Dr. Vicenta Aly and also shown  them to the wife.  At the present time we will recommend a continued medical  approach to treatment.  He will follow up with me closely and we will likely  do an outpatient Cardiolite.       TDS/MEDQ  D:  05/05/2004  T:  05/05/2004  Job:  [id number]

## 2010-10-16 NOTE — Cardiovascular Report (Signed)
NAME:  Ronald [name], Ronald [name]                ACCOUNT NO.:  [id number]   MEDICAL RECORD NO.:  [id number]          PATIENT TYPE:  INP   LOCATION:  2027                         FACILITY:  [facility]   PHYSICIAN:  Loretha Brasil. Lia Foyer, M.D. [facility] OF BIRTH:  1946-04-11   DATE OF PROCEDURE:  07/28/2005  DATE OF DISCHARGE:                              CARDIAC CATHETERIZATION   INDICATIONS:  Ronald [name] is a 65 year old who had revascularization surgery  in 1996 by Dr. Cyndia [name]. He presents with recurrent unstable angina. He has  had prior stenting of the saphenous vein graft to the OM and stenting of the  saphenous vein graft to the right. The patient now presents with recurrent  ischemic symptoms and was brought back to the catheterization laboratory for  further evaluation and treatment. Risks, benefits and alternatives were  discussed with the patient. He was hydrated as he has mild elevation in BUN  and creatinine.   PROCEDURES:  1.  Left heart catheterization.  2.  Selective coronary arteriography.  3.  Selective left ventriculography.  4.  Saphenous vein graft angiography.  5.  Selective left internal mammary angiography.   DESCRIPTION OF PROCEDURE:  The patient was brought to the catheterization  laboratory after informed consent and prepped and draped in the usual  fashion. Through an anterior puncture, the right femoral artery was easily  entered and a 6-French sheath was placed. Views of the left and right  coronary arteries were obtained. Vein graft angiography was then performed.  Internal mammary angiography was performed. A hand injection was done in the  left ventricle. The patient tolerated the procedure well. Minimal contrast  was used during the course of procedure.   HEMODYNAMIC DATA:  1.  Central aortic pressure 180/82.  2.  Left ventricular pressure 187/10.  3.  No gradient on pullback across the aortic valve.   ANGIOGRAPHIC DATA:  1.  The left main is free of critical disease.  2.  The LAD is diffusely diseased in its midportion. The circumflex itself      is totally occluded and there is a diagonal with about 75% narrowing.      There is competitive filling of the distal LAD noted antegrade.  3.  The internal mammary to the distal LAD is widely patent.  4.  The saphenous vein graft to the diagonal is diffusely diseased. There is      segmental 95% narrowing proximally. There is diffuse irregularity      throughout the midportion of the graft and then a 70% area of segmental      plaquing in the mid-distal portion of the graft.  5.  The saphenous vein graft to the obtuse marginal system demonstrates a      previously patent stent. There is a new stenosis of about 90% noted in      the mid-distal portion of the graft. The distal distribution of the      vessel was quite large.  6.  The right coronary artery demonstrates 70-80% narrowing that is totally      occluded.  7.  The saphenous vein  graft to the distal right has a stent proximally.      There is about 70% eccentric stenosis of the stent, although it does not      appear to be critical. The remainder of the graft remains patent.  8.  Ventriculography in the RAO projection using hand injection reveals      preserved global systolic function.   CONCLUSION:  1.  Preserved overall left ventricular function.  2.  Systemic hypertension.  3.  Diabetes mellitus.  4.  Continued patency internal mammary the left anterior descending artery.  5.  Progression of disease in the saphenous vein graft to the obtuse      marginal, saphenous vein graft to the diagonal and saphenous vein graft      to the right.   DISPOSITION:  I plan to review the films with my colleagues. I have asked  Dr. Cyndia [name] to review the films as well. At this point, the patient may need  of redo revascularization given the progression of disease in the grafts.      Loretha Brasil. Lia Foyer, M.D. [facility]  Electronically Signed     TDS/MEDQ  D:   07/28/2005  T:  07/28/2005  Job:  [id number]   cc:   CV Laboratory   Patient's medical record   Ronald [name], M.D.  [address]  [place]  [place] [address]

## 2010-10-16 NOTE — H&P (Signed)
NAME:  Ronald [name], Ronald [name]                ACCOUNT NO.:  [id number]   MEDICAL RECORD NO.:  [id number]          PATIENT TYPE:  INP   LOCATION:  3705                         FACILITY:  [place]   PHYSICIAN:  Loretha Brasil. Lia Foyer, M.D. [facility] OF BIRTH:  1946-02-13   DATE OF ADMISSION:  05/04/2004  DATE OF DISCHARGE:                                HISTORY & PHYSICAL   CHIEF COMPLAINT:  Chest pain.   HISTORY OF PRESENT ILLNESS:  Ronald [name] is well known to  me. He is a 65-  year-old gentleman who presents with some recurrent episodes of substernal  chest pain since Friday. He has not failed to take his medication. The  patient had stenting of two saphenous vein grafts. He has had prior  revascularization surgery with saphenous vein graft to the diagonal, OM, and  RCA as well as the left internal mammary artery to the LAD. The OM and RCA  were stented with Taxis stents in June 2005. He has remained on his Plavix.  The patient has diabetes and hypertension. He has borderline renal  insufficiency.   ALLERGIES:  1.  NITRATES which give him a severe headache.  2.  PENICILLIN.  3.  VICODIN.   CURRENT MEDICATIONS:  1.  Colchicine 0.6 mg p.o. b.i.d.  2.  Micardis 40 mg p.o. daily.  3.  Actos 50 mg p.o. daily.  4.  Altace 10 mg daily.  5.  Atenolol 100 mg daily.  6.  Nexium 40 mg daily.  7.  Aspirin 325 mg daily.  8.  Lipitor 20 mg daily.  9.  Multivitamin daily.  10. Plavix 75 mg daily.   PAST MEDICAL HISTORY:  1.  Coronary artery bypass graft surgery in 1996. At that time he had a vein      graft to the diagonal, OM, and RCA with a left internal mammary artery      to the LAD. He had a cath which revealed continued patency, but stenoses      in the OM and RCA in June 2005 which were stented with Taxis stents.  2.  Diabetes.  3.  Hypertension.  4.  Dyslipidemia.   The patient lives in [place]. He has greater than a 50-pack year history. He  has some sexual dysfunction. He lives with his wife.  He does maintenance  work.   His mother is alive at age 29. He has a history of MI and CVA in her 19s and  34s. Father died at 6 of heart trouble. He has one brother who is alive and  well. He has a 13 year old cousin with heart disease.   REVIEW OF SYSTEMS:  Remarkable for some arthralgias and hearing loss. He has  had some cough and wheezing.   PHYSICAL EXAMINATION:  GENERAL: On physical examination today he is alert  and oriented gentleman in no distress. He is well-developed and well-  nourished.  VITAL SIGNS: Temperature 96.7, pulse 62, respiratory rate 18, blood pressure  191/88.  NECK: No obvious carotid bruits.  LUNGS: Clear to auscultation and percussion.  HEART: The PMI is nondisplaced. There  are normal first and second heart  sounds with an S4 gallop.  ABDOMEN: Soft.  EXTREMITIES: No significant edema. Question left femoral bruits.   EKG reveals a rate of 60, sinus rhythm with no acute changes.   Potassium 4.1, BUN 17, creatinine 17.3, hematocrit 51.   IMPRESSION:  1.  Probable recurrent unstable angina with prior revascularization surgery.  2.  Prior percutaneous intervention of saphenous vein grafts.  3.  Prior revascularization surgery.  4.  Dyslipidemia.  5.  Diabetes.   PLAN:  1.  Admission.  2.  Add heparin.  3.  Serial enzymes.  4.  Cardiac catheterization tomorrow to re-assess patency of the arteries.       TDS/MEDQ  D:  05/04/2004  T:  05/04/2004  Job:  [id number]   cc:   Miguel Aschoff  [address], [address] [place] [address]  Fax: [phone]

## 2010-10-16 NOTE — Discharge Summary (Signed)
NAME:  Ronald [name], Ronald [name] NO.:  [id number]   MEDICAL RECORD NO.:  [id number]          PATIENT TYPE:  INP   LOCATION:  [id number]                         FACILITY:  [facility]   PHYSICIAN:  Ethelle Lyon, M.D. LHCDATE OF BIRTH:  1946/02/03   DATE OF ADMISSION:  04/14/2005  DATE OF DISCHARGE:  04/16/2005                                 DISCHARGE SUMMARY   PRIMARY CARDIOLOGIST:  Marcello Moores D. Lia Foyer, M.D. [facility].   PRIMARY CARE PHYSICIAN:  Miguel Aschoff, M.D.   PRINCIPAL DIAGNOSIS:  Chest and abdominal pain with dyspnea on exertion.   SECONDARY DIAGNOSES:  1.  Coronary artery disease status post coronary artery bypass graft x4 in      1996.  2.  Hypertension.  3.  Hyperlipidemia.  4.  Type 2 diabetes mellitus.  5.  Family history of coronary artery disease.  6.  History of prostatitis.  7.  Remote tobacco abuse.   ALLERGIES/INTOLERANCES:  HYDROCHLOROTHIAZIDE, VICODIN, NITRATES, AND  PENICILLIN.   PROCEDURES:  1.  Adenosine Myoview.  2.  2-D echocardiogram.  3.  CT of the abdomen.   HISTORY OF PRESENT ILLNESS:  The patient is a 65 year old male with prior  history of CAD status post CABG x4 in 1996, with PCI of the OM and RCA with  Taxis drug-eluting stent in 2005.  He presented to the emergency room on  April 14, 2005 with a several-week history of progressive dyspnea on  exertion that was significantly worse on the day of admission.  He also  complained of a several-week history of abdominal pain discomfort and  light 1-2/10 chest pain.  He ruled out for MI and was admitted for further  evaluation.   HOSPITAL COURSE:  The patient underwent adenosine Myoview on April 15, 2005 which showed a fixed inferobasilar defect suggestive of a small prior  infarct versus inferior basilar thinning.  He was also noted to have mild  ischemia in the distal anterior wall.  His EF was 52%, with normal wall  motion.  This was felt to be a low risk study, and therefore, he  would be  medically managed from a cardiac standpoint.  GI was consulted given the  abdominal discomfort that he has been having, and a CT angiography of the  abdomen was performed revealing widely patent celiac axis and SMA, a  decompressed small bowel and a nondistended colon, as well as  cholelithiasis.  There was also found to be probable significant stenosis at  one of three right renal arteries as well as in the larger of the two left  renal arteries.  Results were discussed with Dr. Olevia Perches and Dr. Albertine Patricia, and  it was felt that the patient was stable for discharge today, with followup  scheduled with Drs. Stuckey and Dr. Albertine Patricia.   DISCHARGE LABORATORY DATA:  Hemoglobin 15.1, hematocrit 43.5, WBC 4.8,  platelets 167.  Sodium 139, potassium 4.0, chloride 107, CO2 27, BUN 18,  creatinine 1.3, glucose 108.  Total bilirubin 3.1, alkaline phosphatase 145,  AST 25, ALT 28, total protein 5.9, albumin 3.7, calcium 9.3.  Amylase  79.  Hemoglobin A1C 5.5.  Lipase 36.  Cardiac markers were negative x3.  Total  cholesterol 134, triglycerides 75, HDL 36, LDL 83.  TSH 1.724.   DISPOSITION:  The patient is being discharged home today in good condition.   FOLLOW UP:  He is asked to follow up with Dr. Lia Foyer in one to two weeks as  well as to establish an appointment with Dr. Albertine Patricia in  for followup of  renal artery stenosis.  He follow up with [facility], Dr. Deatra Ina, as well as  his primary care physician, Dr. [name].   DISCHARGE MEDICATIONS:  1.  Benicar 40 mg daily.  2.  Norvasc 5 mg daily.  3.  Colchicine as previously prescribed.  4.  Actos 15 mg daily.  5.  Lisinopril 40 mg daily.  6.  Atenolol 100 mg daily.  7.  Aspirin 325 mg daily.  8.  Lipitor 20 mg daily.  9.  Multivitamin one daily.  10. Omeprazole 20 mg daily.  11. Plavix 75 mg daily.  12. Nitroglycerin 0.4 mg sublingual p.r.n. chest pain.   OUTSTANDING LABORATORY DATA:  None.   DURATION OF DISCHARGE ENCOUNTER:  40 minutes,  including physician time.      Rogelia Mire, NP      Ethelle Lyon, M.D. [facility]  Electronically Signed    CRB/MEDQ  D:  04/16/2005  T:  04/18/2005  Job:  [id number]   cc:   Loretha Brasil. Lia Foyer, M.D. [facility]  [address]  [address]  [place]  [place] [address]   Ethelle Lyon, M.D. [facility]  [address]  [address]  [place]  [place] [address]   Robert D. Deatra Ina, M.D. [facility]  [address] [address]   Richard Gilbert  Fax: [phone]

## 2010-10-16 NOTE — Discharge Summary (Signed)
NAME:  JABAR, TREDINNICK                ACCOUNT NO.:  [id number]   MEDICAL RECORD NO.:  [id number]          PATIENT TYPE:  INP   LOCATION:  3705                         FACILITY:  [facility]   PHYSICIAN:  Loretha Brasil. Lia Foyer, M.D. [facility] OF BIRTH:  03-10-1946   DATE OF ADMISSION:  05/04/2004  DATE OF DISCHARGE:  05/06/2004                                 DISCHARGE SUMMARY   PROCEDURES:  1.  Cardiac catheterization.  2.  Coronary arteriogram.  3.  Left ventriculogram.  4.  Graft to angiogram.  5.  LIMA arteriogram.   HOSPITAL COURSE:  [name] is a 65 year old male with known coronary  artery disease.  He had bypass surgery in 1996 and had Taxus stents to two  of his vein grafts in June 2005.  He had multiple episodes of chest pain in  the four days prior to admission.  The patient pain would last a few seconds  and did not last long enough for him to try any medication.  There was no  clear association with exertion or meals.  He also stated that he had been  constipated, and that was generally a symptom of progressive heart disease  for him.  Mr. Tsung was admitted for further evaluation and treatment.   His cardiac enzymes were negative for MI, and it was felt that cardiac  catheterization was indicated to further define his anatomy.  This was  performed on May 05, 2004.   The cardiac catheterization showed a patent LIMA to LAD.  The SVG to  diagonal had 57% stenosis, and the SVG to OM had less than 40% instant  restenosis.  The SVG to RCA had approximately 50% instant restenosis.  Dr.  Lia Foyer reviewed the films and felt that medical therapy was the best option  at this time with an outpatient Cardiolite to see if there were any  definable areas of ischemia.   On May 06, 2004, Mr. Nachreiner was ambulating without chest pain or  shortness of breath.  His lungs were clear.  His cath site was within normal  limits, and his BUN and creatinine were not significantly changed.  He  was  considered stable for discharge with outpatient follow up arranged.   CHEST X-RAY:  Probably negative chest reactive disease with postoperative  changes.  Follow up examination may be helpful to evaluate prominent  markings.   LABORATORY DATA:  Hemoglobin 15.5, hematocrit 45.1, WBC 6.9, platelets  169,000.  Sodium 141, potassium 3.8, chloride 109, CO2 27, BUN 18,  creatinine 1.5, glucose 102.  Total bilirubin slightly elevated at 1.8.  Alkaline phosphatase 125.  Other C-Met values within normal limits except  for borderline low total protein at 5.9 and albumin at 3.4.  Hemoglobin A1C  5.2.  Total cholesterol 153, triglyceride 53, HDL 46, LDL 96, TSH 2.161.   DISCHARGE DIAGNOSES:  1.  Chest pain, multiple 50% area of stenosis.  Cardiolite to evaluate.  2.  Status post aortocoronary bypass surgery in 1996 with saphenous vein      graft to diagonal, saphenous vein graft to obtuse marginal and saphenous  vein graft to right coronary artery and LIMA to left anterior      descending.  3.  Status post Taxus stent to the saphenous vein graft to the obtuse      marginal and saphenous vein graft to right coronary artery in June 2005      with an ejection fraction of 65%.  4.  History of percutaneous intervention in 1987.  5.  Diabetes.  6.  Hypertension.  7.  Hyperlipidemia.  8.  Family history of coronary artery disease.  9.  Borderline renal insufficiency.  10. Sexual dysfunction.   ALLERGIES/INTOLERANCES:  NITRATES, PENICILLIN, HCTZ, VICODIN.   DISCHARGE INSTRUCTIONS:  Activity level is to include no strenuous activity  for two days.  He is to get a stress test on December 11 and not eat or  drink anything after midnight before and no caffeine for 24 hours.  He is to  hold his Atenolol and Actos for daily tests.  He is, otherwise, to stick to  a low-fat diabetic diet.  He his to call our office for problems with the  cath site.  He follows up with Dr. Lia Foyer on December 14  and Dr. [name] as  needed.   DISCHARGE MEDICATIONS:  1.  Micardis 40 mg daily.  2.  Colchicine 0.6 mg daily.  3.  Actos 15 mg daily.  4.  Altace 10 mg daily.  5.  Atenolol 100 mg daily.  6.  Nexium 40 mg daily.  7.  Aspirin 325 mg daily.  8.  Lipitor 20 mg daily.  9.  Multivitamin daily.  10. Plavix 75 mg daily.       RB/MEDQ  D:  05/06/2004  T:  05/06/2004  Job:  [id number]   cc:   Miguel Aschoff  [address] [place] [address]  Fax: [phone]

## 2010-10-16 NOTE — H&P (Signed)
NAME:  Ronald [name], Ronald [name] NO.:  [id number]   MEDICAL RECORD NO.:  [id number]          PATIENT TYPE:  INP   LOCATION:  1823                         FACILITY:  [facility]   PHYSICIAN:  Kirk Ruths, M.D. LHCDATE OF BIRTH:  06-05-1945   DATE OF ADMISSION:  07/27/2005  DATE OF DISCHARGE:                                HISTORY & PHYSICAL   The patient is a 65 year old male with past medical history of coronary  artery disease status post coronary bypass and graft, hypertension,  hyperlipidemia, and diabetes mellitus, who presented with chest pain.  The  patient is status post coronary artery bypass graft in 1996 with a LIMA to  the LAD, saphenous vein graft to the diagonal, saphenous vein graft to the  OM, and saphenous vein graft to the right coronary artery.  In June 2005,  Taxus stent to the OM and right coronary artery. He did undergo cardiac  catheterization in December 2006 which showed a 90% LAD, 70% second  diagonal, but a patent limb to the LAD.  The saphenous vein graft to the  diagonal had a 50 to 70% lesion.  The circumflex was totaled.  The saphenous  vein graft to the OM had a 40% in-stent restenosis but widely patent lumen  and focal 50% stenosis in the distal vessel.  The RCA was totaled, but the  saphenous vein graft to the right coronary artery was patent.  LV function  has been normal.   Over the past 3 weeks, he has described a burning chest pain.  This pain is  not related to exertion nor is it related to food.  It can last seconds at a  time, and there are no associated symptoms.  However, the past 3 days he  complains of exertional chest pain relieved with rest.  He describes it as  heavy lead on his chest.  There is associated shortness of breath, but  there is no nausea, vomiting, or diaphoresis.  The pain is not pleuritic or  positional.  It is not related to food.  He had last episode today.  He has  had none at rest, and he is presently pain  free.   MEDICATIONS:  1.  Benicar 40 mg p.o. daily.  2.  Norvasc 5 mg p.o. daily.  3.  Hydrochlorothiazide.  4.  Colchicine.  5.  Actos.  6.  Lisinopril.  7.  Atenolol.  8.  Aspirin.  9.  Lipitor.   ALLERGIES:  He is intolerant to VICODIN and PENICILLIN as well as NITRATES  per previous History and Physicals.   SOCIAL HISTORY:  He has a remote history of tobacco use but has not smoked  since 1988.  He does not consume alcohol.   FAMILY HISTORY:  Significant for rheumatic fever but no coronary disease in  his immediate family.   PAST MEDICAL HISTORY:  Significant for:  1.  Hypertension.  2.  Hyperlipidemia.  3.  Diabetes mellitus.  4.  He does have a history of coronary artery disease and had prior coronary      artery bypass grafts  as described in the HPI  5.  He also has a history of mild emphysema per his wife.  6.  There is a history of prostatitis.   REVIEW OF SYSTEMS:  He denies any headaches, fevers or chills.  There is no  productive cough or hemoptysis.  There is no dysphagia or odynophagia,  melena, or hematochezia.  He also had problems with abdominal pain of  uncertain etiology in the past with no other previous workup.  He denies any  hematuria at present. There is no orthopnea, PND, or edema.  He has had  dyspnea on exertion.   PHYSICAL EXAMINATION:  VITAL SIGNS: Blood pressure 153/80, pulse 71.  He is  afebrile.  GENERAL:  Well-developed, well-nourished, in no acute distress.  His skin is  warm and dry.  He does not appear to be depressed, and there is no peripheral clubbing.  HEENT:  Unremarkable with normal eyelids.  NECK:  Supple with normal upstroke bilaterally, and I cannot appreciate  bruits.  There is no jugular venous distention and no thyromegaly.  CHEST: Clear to auscultation.  CARDIOVASCULAR: Regular rate and rhythm with normal S1 and S2.  There are no  murmurs, rubs, or gallops noted.  ABDOMEN:  Nontender, nondistended.  Positive bowel  sounds.  No  hepatosplenomegaly and no masses appreciated. There is bruit.  EXTREMITIES:  He has 2+ femoral pulses bilaterally with no bruits. His  extremities show no edema, and I can palpate no cords. He has 1+ dorsalis  pedis pulses bilaterally.  NEUROLOGIC:  Grossly intact.   Electrocardiogram shows a normal sinus rhythm at a rate of 67.  The axis is  normal.  There are no ST changes noted.   DIAGNOSES:  1.  Unstable angina.  2.  Coronary artery disease status post coronary artery bypass graft.  3.  Hypertension.  4.  Hypercholesterolemia.  5.  Diabetes mellitus.   PLAN:  [name] presents with symptoms of two types.  One, chest pain is  somewhat atypical and is described as a burning sensation lasting for  seconds.  However, his second chest pain is classic for unstable angina.  We  will plan to admit and rule out myocardial infarction with serial enzymes.  We will continue his present medications including aspirin, atenolol, and a  statin.  I will add IV heparin to his medical regimen.  We will plan to  proceed with a cardiac catheterization tomorrow.  The risks and benefits  have been discussed, and the patient agrees to proceed.  Will make further  recommendations once we have those images.           ______________________________  Kirk Ruths, M.D. [facility]     BC/MEDQ  D:  07/27/2005  T:  07/27/2005  Job:  [id number]

## 2010-10-16 NOTE — H&P (Signed)
NAME:  Ronald [name], Ronald [name] NO.:  [id number]   MEDICAL RECORD NO.:  [id number]          PATIENT TYPE:  EMS   LOCATION:  MAJO                         FACILITY:  [facility]   PHYSICIAN:  Glori Bickers, M.D. LHCDATE OF BIRTH:  Feb 06, 1946   DATE OF ADMISSION:  04/14/2005  DATE OF DISCHARGE:                                HISTORY & PHYSICAL   PRIMARY CARE PHYSICIAN:  Miguel Aschoff, M.D.   PRIMARY CARDIOLOGIST:  Loretha Brasil. Lia Foyer, M.D.   CHIEF COMPLAINT:  Chest pain/abdominal pain/dyspnea on exertion.   HISTORY OF PRESENT ILLNESS:  Mr. Minckler is a 65 year old male with known  coronary artery disease.  He had chest pain in December of 2005 and had a  cardiac catheterization at that time and medical therapy was recommended.  He has done fairly well since then, although he was scheduled for a stress  test in August which he put off and is actually scheduled for a stress test  next week.   Last week Mr. Arkwright had significant hypertension with a systolic blood  pressure 99991111 to 200.  He was started on Norvasc 5 mg a day with some  improvement.  Additionally, for a couple of weeks he has been having  abdominal pain that is worse near the umbilicus.  There is no association  with exertion and there is possibly a postprandial element to it but this is  not consistent either.  He has had multiple episodes of chest pain that he  describes as light in a 1 or 2/10.  They resolve spontaneously.  There is no  exertional component to the chest pain either.  He has also had dyspnea on  exertion for the last month which has been worse over the last week and has  increased again today.  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  He is not short of breath at rest.  He had  some abdominal pain  today but his symptoms have resolved.   ALLERGIES:  He is allergic or intolerant to HYDROCHLOROTHIAZIDE, VICODIN,  NITRATES and PENICILLIN.   MEDICATIONS:  1.  Benicar 40 mg daily.  2.  Norvasc 5 mg daily.  3.  Colchicine 0.6 mg daily and p.r.n.  4.  Actos 15 mg daily.  5.  Lisinopril 40 mg daily.  6.  Atenolol 100 mg daily.  7.  Aspirin 325 mg a day.  8.  Lipitor 20 mg a day.  9.  Multivitamin daily.  10. Omeprazole 20 mg daily.  11. Plavix 75 mg a day.   PAST MEDICAL HISTORY:  1.  Status post aortocoronary bypass surgery in 1996 with LIMA to LAD, SVG      to diagonal, SVG to OM and SVG to RCA.  2.  Status post TAXUS stents to the OM and RCA in June of 2005.  3.  Diabetes.  4.  Hypertension.  5.  PCI in 1988.  6.  Hyperlipidemia.  7.  Family history of coronary artery disease but not prematurely.  8.  Recently history of prostatitis, completed antibiotic therapy.  9.  Status post cardiac catheterization in December of 2006 which showed LAD      90%, second diagonal 70%, LIMA to LAD patent, SVG to diagonal 50 to 70%,      circumflex totaled, SVG to OM about 40% in-stent restenosis but a widely      patent lumen and a focal 50% stenosis in the distal vessel, non-flow      limiting and no critical distal disease, RCA totaled but SVG to RCA      patent, nonobstructive disease in the vein graft as well as distal      vessel, EF showing vigorous global systolic function.   PAST SURGICAL HISTORY:  1.  He is status post cardiac catheterizations as well as aortocoronary      bypass surgery.  2.  Sigmoidoscopy.  3.  Lung cyst rupture and repair two weeks after  his bypass surgery.   SOCIAL HISTORY:  He lives in [place], [place], with his wife and  works in park maintenance.  He has approximately 25-pack-year history of  tobacco use and quit in 1988.  He does not abuse alcohol or drugs.   REVIEW OF SYSTEMS:  Significant for approximately 10 pound weight gain which  he  says is because he has been eating more.  He has woken up diaphoretic the  last two days but denies fever or chills.  He has had some dysuria and  urgency with urination, although he has completed antibiotics for possible  prostatitis.  He denies depression or arthralgias.  He has abdominal pain as  described above and he has occasional reflux symptoms.  Review of systems is  otherwise negative.   PHYSICAL EXAMINATION:  GENERAL APPEARANCE:  Well-developed, elderly white  male who appears comfortable.  VITAL SIGNS:  Temperature 97.8, blood pressure 150/79, pulse 67, respiratory  rate 20, O2 saturation 98% on room air.  HEENT:  Head is normocephalic and atraumatic.  Pupils are equal, round,  reactive to light and accommodation.  Extraocular movements intact. Sclerae  are clear.  Nares without discharge.  NECK:  There is no lymphadenopathy, thyromegaly, bruit or JVD noted.  LUNGS:  Clear to auscultation bilaterally.  CARDIOVASCULAR:  His heart is regular in rate and rhythm with an S1, S2 and  a 2/6 systolic ejection murmur at the left sternal border.  Distal pulses  are 2+ and bilateral femoral bruits are appreciated.  ABDOMEN:  Soft and obese and has active bowel sounds.  There is diffuse  tenderness which is worse over the umbilical area but no rebound or guarding  is noted.  EXTREMITIES:  There is no clubbing, cyanosis, or edema.  No cords.  MUSCULOSKELETAL:  There is no joint deformity or effusions and no spine or  CVA tenderness.  SKIN:  No rashes or lesions are noted.  NEUROLOGIC:  He is alert and oriented.  Cranial nerves II-XII grossly  intact.   Chest x-ray:  Stable mild cardiomegaly with mild chronic lung disease.   EKG:  Sinus rhythm, rate 66 with no acute ischemic changes.   LABORATORY DATA:  Pending.  Point of care markers are negative x2.   IMPRESSION:  Mr. Monjaraz has been seen and examined.  He complains of shortness of breath, abdominal pain and occasional chest  pain.   Prior  anginal symptoms for abdominal pain and chest pain.  The EKG and point of  care markers were negative.  Blood pressure is elevated.   PLAN:  Admit and rule out MI on telemetry.  There is no indication for  heparin as there are no objective signs of ischemia and he is not at high  risk for DVT/PE.  His blood pressure will be controlled and we will change  atenolol to Coreg 12.5 mg b.i.d. which can be up-titrated p.r.n. We will  also increase Norvasc to 10 mg a day.  If his cardiac enzymes are negative,  he will get an adenosine Myoview in a.m.  He also needs to lose weight and a  nutrition consult will be called.      Rosaria Ferries, P.A. [facility]      Glori Bickers, M.D. [facility]  Electronically Signed    RB/MEDQ  D:  04/14/2005  T:  04/15/2005  Job:  [id number]   cc:   Miguel Aschoff  Fax: [phone]   Loretha Brasil. Lia Foyer, M.D. [facility]  [address] [place]  [place] [address]

## 2010-10-16 NOTE — Assessment & Plan Note (Signed)
[facility]                            CARDIOLOGY OFFICE NOTE   NAME:Ronald [name], Ronald [name]                       MRN:          [id number]  DATE:09/13/2006                            DOB:          May 20, 1946    Mr. Kath is seen today in a follow-up visit.  In general, he has been  stable.  He has been having no ongoing chest pain or significant  shortness of breath.  He actually feels quite well.   MEDICATIONS:  1. Lipitor 20 mg nightly.  2. Actos 50 mg daily.  3. Colchicine 0.6 b.i.d.  4. Allopurinol 100 mg 2 tablets daily.  5. Aspirin 81 mg daily.  6. Multivitamin daily.  7. Toprol XL 50 mg daily.  8. Omeprazole 20 mg daily.  9. Amlodipine 5 mg daily.   PHYSICAL EXAMINATION:  VITAL SIGNS:  Blood pressure 152/82, pulse 63.  LUNGS:  The lung fields are clear.  CARDIAC:  The cardiac rhythm is regular.  The sternotomy is well healed.  I cannot appreciate an abdominal bruit.   The electrocardiogram demonstrates a normal sinus rhythm and is entirely  within normal limits.   Recent studies revealed a BUN of 16, creatinine 1.3, and a potassium of  4 in January.   During his hospitalization, post hospitalization, he has had a follow-up  renal ultrasound.  This was a poor quality study, largely related to, I  believe, bowel gas.  There did appear to be some differential in the  kidneys with the right kidney larger than the left, and there was  thought to be some cysts within and adjacent to the left kidney and one  cyst within the right.   Overall, the patient has remained stable and continued to do well.  He  has undergone revascularization surgery and has a normal  electrocardiogram despite his prior history.  At the present time, we  may want to consider an MRI of the renal arteries or even possibly an  angiographic study.  I will likely arrange a follow-up visit potentially  with Dr. Burt Knack to address this.     Loretha Brasil. Lia Foyer, MD, [facility]  Electronically Signed   TDS/MedQ  DD: 09/13/2006  DT: 09/14/2006  Job #: [id number]   cc:   Miguel Aschoff

## 2010-10-16 NOTE — Discharge Summary (Signed)
NAME:  Ronald [name], Ronald [name]                ACCOUNT NO.:  [id number]   MEDICAL RECORD NO.:  [id number]          PATIENT TYPE:  INP   LOCATION:  2002                         FACILITY:  [facility]   PHYSICIAN:  Gilford Raid, M.D.     DATE OF BIRTH:  March 24, 1946   DATE OF ADMISSION:  07/27/2005  DATE OF DISCHARGE:  08/06/2005                                 DISCHARGE SUMMARY   CARDIOLOGIST:  Kirk Ruths, M.D.   DISCHARGE DIAGNOSIS:  Unstable angina.   DISCHARGE DIAGNOSES:  1.  Severe native three-vessel coronary artery disease and saphenous vein      grafts to these, status post coronary artery bypass graft surgery in      1996.  2.  Hypertension.  3.  Hyperlipidemia.  4.  Diabetes mellitus type 2.  5.  History of mild emphysema.  6.  History of prostatitis.  7.  History of gout.   ALLERGIES:  The patient is intolerant to Vicodin, penicillin, nitrates.   CONSULTATIONS:  Gilford Raid, M.D., was consulted on July 29, 2005, for  cardiovascular and thoracic surgery.   PROCEDURES:  1.  On July 28, 2005, the patient underwent cardiac catheterization by      Loretha Brasil. Lia Foyer, M.D., at [facility].  2.  On August 02, 2005, the patient underwent a redo median sternotomy,      extracorporeal circulation, redo coronary bypass graft surgery x3 using      a saphenous vein graft to the diagonal branch of the left anterior      descending artery, a saphenous vein graft to the obtuse marginal branch      of the left circumflex coronary, and a saphenous vein graft to the      posterior descending coronary.  Endoscopic vein harvesting from the left      leg.  By Gilford Raid, M.D.   HISTORY AND PHYSICAL EXAM:  The patient was admitted to [facility]  on July 27, 2005, for atypical angina.  The patient has been  experiencing burning chest pain for the past three weeks.  Pain is not  related to exertion or food.  The pain lasts seconds at a time, and there  are no associated  symptoms.  However, over the past three days the patient  did complain of exertional chest pain relieved with rest.  He describes it  as a heavy lead on his chest.  There is associated shortness of breath but  no nausea, vomiting or diaphoresis.  The pain is not pleuritic or  positional.  The patient is a 65 year old male with a past medical history  of coronary artery disease, status post coronary bypass graft, hypertension,  hyperlipidemia and diabetes mellitus, who presented with chest pain in the  ER.  He is status post coronary artery bypass grafting in 1996 with a LIMA  to the LAD, saphenous vein graft to the diagonal, saphenous vein graft to  the OM, and saphenous vein graft to the right coronary artery.  In June  2005, he had TAXUS stent to the OM and right coronary artery.  He did  undergo a cardiac catheterization in December 2006, which showed a 90% LAD,  70% second diagonal but a patent limb to the LAD.  The saphenous vein graft  to the diagonal had a 50-70% lesion.  The circumflex was totaled.  The  saphenous vein graft to the OM had a 40% in-stent restenosis but widely  patent lumen and focal 50% stenosis of the distal vessel.  The RCA was  totaled, but the saphenous vein graft to the right coronary artery was  patent.  LV function has been normal.   Cardiac catheterization on July 28, 2005, showed a patent left internal  mammary graft to the LAD.  The proximal LAD was occluded.  The left  circumflex was occluded.  The saphenous vein graft to the obtuse marginal  had a segmental high-grade stenosis in it.  The saphenous vein graft to the  diagonal branch also had a severe, diffuse high-grade stenosis.  The right  coronary artery was occluded.  The saphenous vein graft to the distal right  coronary artery was also severely diseased and had a stent in the ostium  placed in the past, which was stenosed.  Overall left ventricular function  was well-preserved.  It was thus  felt the patient undergo redo coronary  artery bypass grafting, and he agreed.   HOSPITAL COURSE:  Preoperatively the patient was stable with care by  cardiology.  He did experience some chest pain; however, it was stable.  The  patient was treated with IV heparin while in the hospital.  He had ABIs done  on July 29, 2005, which were normal.  The patient was treated with a  nitroglycerin drip for his chest pain.  His EKGs were without any changes  and enzymes were negative.  CABG was performed on August 02, 2005.  Postoperatively the patient did well.  He remained afebrile and vital signs  stable.  The patient's chest x-ray showed a large pneumothorax on August 03, 2005.  On August 03, 2005, a left chest tube was reinserted, and it was  discontinued on March __________ , 2007.  On postop day #2, the patient was  still doing well.  He did have renal insufficiency.  The patient's Lasix was  held and on postop day #3, his creatinine was down to 1.5.  Previously it  was 1.8.  On August 04, 2005, the patient's chest x-ray showed a small left  apical pneumothorax, which was stable.  It was felt that the pneumothorax  was residual.  The patient did not have an air leak in his chest tube.  On  August 05, 2005, the patient's pneumothorax decreased to 10-20% on the left  side without an air leak.  Tentatively we are going to discontinue the chest  tube in the a.m. with a follow-up chest x-ray.   The patient was maintained on a sliding scale insulin and Lantus for his  diabetes mellitus, which was well-controlled throughout his hospital stay.  The patient did have some acute blood loss anemia, which he was asymptomatic  for.  The anemia was stable and improving postoperatively.  The patient is  ambulating well around the halls and continuing his incentive spirometry.   PHYSICAL EXAMINATION:  VITAL SIGNS:  Blood pressure 130/71, heart rate 75, respirations 20, temperature 97.1, O2 97% on room air.  The  patient's weight  is 231.1, preoperatively 220.  The patient's CBGs are 100, 131.  On  telemetry, he is normal sinus rhythm  at 97 beats per minute.  Chest tube  shows no air leak.  Minimal drainage.  CARDIOVASCULAR:  Regular rate and rhythm.  RESPIRATIONS:  Clear to auscultation bilaterally.  ABDOMEN:  Benign.  EXTREMITIES:  Trace edema.  Pulses 2+ bilaterally.  SKIN:  Incisions clear, dry and intact and healing well.   LABORATORY DATA:  Chest x-ray shows a left pneumothorax about 10-20%, which  is stable.  BMP showed a sodium of 144, potassium of 4.1, chloride 108,  bicarbonate 30, BUN 21, creatinine 1.5, and glucose of 95.   DISCHARGE CONDITION:  Good.   DISPOSITION:  The patient will be discharged to home in the a.m. if his  chest x-ray is stable.   MEDICATIONS:  1.  Toprol XL 25 mg daily.  2.  Lipitor 20 mg q.h.s.  3.  Actos 15 mg p.o. daily.  4.  Ultram 50 mg one to two p.o. every six hours p.r.n.   INSTRUCTIONS:  The patient is instructed to follow a low-fat, low-salt diet.  He is to do no driving or heavy lifting greater than 10 pounds for three  weeks.  He is to continue ambulating each day and increase activity as  tolerated.  The patient was instructed to continue his breathing exercises.  The patient may shower and clean his wounds with mild soap and water.  He is  to call the office with any wounds problems such as __________ , erythema,  drainage, temperature greater than 101.5.   FOLLOW-UP:  The patient has an appointment with Dr. Cyndia Bent on August 24, 2005, at 12:15.  He has an appointment with Dr. Lia Foyer on March 20 at 11:45  a.m., where the patient will have a chest x-ray taken.      Richardson Dopp, [place]      Gilford Raid, M.D.  Electronically Signed    JMW/MEDQ  D:  08/05/2005  T:  08/06/2005  Job:  [id number]   cc:   Loretha Brasil. Lia Foyer, M.D. [facility]  [address]  [place] [address]

## 2010-10-16 NOTE — Discharge Summary (Signed)
NAME:  Ronald [name], Ronald [name]                ACCOUNT NO.:  [id number]   MEDICAL RECORD NO.:  [id number]          PATIENT TYPE:  INP   LOCATION:  6531                         FACILITY:  [place]   PHYSICIAN:  Thomas C. Wall, MD, FACCDATE OF BIRTH:  [birth year]   DATE OF ADMISSION:  05/12/2006  DATE OF DISCHARGE:  05/14/2006                               DISCHARGE SUMMARY   PROCEDURES:  None.   PRIMARY DIAGNOSIS:  1. Chest pain, cardiac enzymes negative for myocardial infarction and      outpatient Myoview negative.   SECONDARY DIAGNOSIS:  1. Status post aortocoronary bypass surgery in 1996 with saphenous      vein graft to left anterior descending, saphenous vein graft to      obtuse marginal, and saphenous vein graft to posterior descending      artery.  2. Redo bypass surgery in March 2007 with saphenous vein graft to      posterior descending artery and saphenous vein graft to obtuse      marginal.  3. Diabetes  4. Ischemic cardiomyopathy with an ejection fraction of 40-50%.  5. Acute renal insufficiency with a BUN of 25 and creatinine of 2 on      admission  6. Gout.  7. Hyperlipidemia.  8. History of prostatitis.  9. Emphysema.  10.Hypertension.  11.Allergy or intolerance to Vicodin, penicillin, nitrates.   TIME AT DISCHARGE:  37 minutes.   HOSPITAL COURSE:  [name] is a 65 year old male with known coronary  artery disease.  He had redo bypass surgery in March 2007.  He came to  the hospital for chest discomfort and was admitted for further  evaluation and treatment.   [name] also complained of abdominal discomfort.  He stated the  abdominal discomfort is more like an anginal symptom for him than the  actual chest discomfort.  The chest discomfort resolved spontaneously  and his cardiac enzymes were negative.  The abdominal discomfort  resolved, as well.  [name] felt that his symptoms started after  receiving a flu shot about a week ago.   His cardiac enzymes were  negative for MI.  His BUN and creatinine were  elevated on admission at 25/2.  He was hydrated and his ACE inhibitor  was held.  By discharge, his BUN was 13 with a creatinine of 1.2.  He is  to get an outpatient ultrasound of his abdomen and renal arteries.  Of  note, his bilirubin was slightly elevated at 1.9 and alkaline  phosphatase was slightly elevated at 133.  AST and ALT were within  normal limits.  Albumin and protein was slightly low at 5.0 and 3.2 and  calcium was within normal limits.  Amylase and lipase were within normal  limits, as well.  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  His LFTs  have been abnormal in the past with total bilirubin in March 2007 of 1.5  with an alkaline phosphatase at that time of 141.  [name] has no  abdominal tenderness at this time and he is felt stable for outpatient  follow-up.  He is to stay off his ACE inhibitor.  He had Norvasc added  to his medication for blood pressure control and this can be titrated up  as an outpatient on a p.r.n. basis.  [name] was evaluated by Dr. Verl Blalock  and considered stable for discharge on May 14, 2006, with  outpatient follow-up arranged.   DISCHARGE INSTRUCTIONS:  His activity level to be increased gradually.  He is to stick to a low-fat diabetic diet.  He is to follow up with Dr.  Lia Foyer for a stress test and an abdominal ultrasound and a message has  been left with the office to call.  He is to follow up with Dr. Lia Foyer  and Dr. [name], as well.   DISCHARGE MEDICATIONS:  1. Metoprolol succinate (Toprol XL) 50 mg a day.  2. Allopurinol 100 mg b.i.d.  3. Colchicine 0.6 mg daily.  4. Actos 15 mg a day.  5. Lisinopril was stopped.  6. Lipitor 20 mg a day.  7. Aspirin 81 mg a day.  8. Vitamins as prior to admission.  9. Norvasc 5 mg a day.      Rosaria Ferries, PA-C      Marijo Conception. Verl Blalock, MD, [facility]  Electronically Signed    RB/MEDQ  D:  05/14/2006  T:  05/14/2006  Job:  [id number]   cc:    Miguel Aschoff

## 2010-10-16 NOTE — Discharge Summary (Signed)
NAME:  Ronald [name], Ronald [name]                          ACCOUNT NO.:  [id number]   MEDICAL RECORD NO.:  [id number]                   PATIENT TYPE:  OIB   LOCATION:  6532                                 FACILITY:  [facility]   PHYSICIAN:  Ethelle Lyon, M.D. [facility]         DATE OF BIRTH:  11-07-1945   DATE OF ADMISSION:  11/18/2003  DATE OF DISCHARGE:  11/19/2003                           DISCHARGE SUMMARY - REFERRING   DISCHARGE DIAGNOSES:  1. Coronary artery disease, status post percutaneous coronary intervention     with saphenous vein graft to the obtuse marginal as well as a saphenous     vein graft to the right coronary artery.  2. Hyperlipidemia, untreated.  3. Known coronary artery disease, status post coronary artery bypass graft     1996.  4. Emphysema.  5. Gout.  6. Intolerances to PENICILLIN, HYDROCHLOROTHIAZIDE, and VICODIN.   HOSPITAL COURSE:  Ms. Pomeranz is a 65 year old male who underwent cardiac  catheterization under the care of Dr. Percival Spanish on November 15, 2003.  She was  found to have disease in the saphenous vein graft to the OM as well as  saphenous vein graft to the right coronary artery.  She was readmitted on  November 18, 2003, and underwent PCI of both of these areas utilizing at Taxus  Express 2 Monorail stent.  She tolerated the procedure well and we recommend  Plavix for at least six months and aspirin indefinitely.   The patient remained in the hospital overnight and was ready for discharge  to home in stable condition.   MEDICATIONS:  We will continue her same medications as at home which  include:  1. Aspirin 325 mg a day.  2. Plavix 75 mg a day.  3. Lipitor 20 mg q.h.s.  4. Micardis 40 mg a day.  5. Vitamins daily.  6. Nexium daily.  7. Atenolol 100 mg a day.  8. Altace 10 mg a day.  9. Actos 15 mg a day.  10.      Colchicine as needed.  11.      She may utilize sublingual nitroglycerin p.r.n. chest pain.   DISCHARGE ACTIVITIES:  No straining or heavy  lifting x 1 week.  Remain on a  low fat, diabetic diet.  Clean her catheterization site with soap and water.  Call for questions or concerns at [phone].   FOLLOW UP:  She has a followup appointment with Loretha Brasil. Lia Foyer, M.D. [facility],  on December 26, 2003, at 11 a.m.     Joesphine Bare, P.A. [facility]                      Ethelle Lyon, M.D. [facility]   LB/MEDQ  D:  11/19/2003  T:  11/20/2003  Job:  [id number]

## 2010-10-16 NOTE — H&P (Signed)
NAME:  Ronald [name], Ronald [name]                ACCOUNT NO.:  [id number]   MEDICAL RECORD NO.:  [id number]          PATIENT TYPE:  EMS   LOCATION:  MAJO                         FACILITY:  [place]   PHYSICIAN:  Peter C. Johnsie Cancel, MD, FACCDATE OF BIRTH:  [birth year]   DATE OF ADMISSION:  05/12/2006  DATE OF DISCHARGE:                              HISTORY & PHYSICAL   PRIMARY CARDIOLOGIST:  Dr. Addison Lank   PRIMARY CARE PHYSICIAN:  Dr. Miguel Aschoff   HISTORY OF PRESENT ILLNESS:  This is a 65 year old Caucasian male, known  to Dr. Lia Foyer, with a history of coronary artery disease.  The patient  had a recent coronary artery bypass graft redo in March 2007 with SVG to  OM of the left circumflex and SVG to PDA.  The patient had a recent  cardiac catheterization in February 2007 showing progression of disease  in the SVG to OM and SVG to PDA.   The patient has had 1 week duration of increasing chest discomfort and  abdominal pain.  The patient began to feel chest pain while walking in  the mall approximately 5 days ago with what he describes as a 4/10  burning-ache pain lasting a few seconds.  The pain was midsternal, comes  and goes about every 1 to 2 hours.  The patient states that in the past  he had some stomach pain and stomach gurgling, which is his angina  equivalent.  The pain apparently was evaluated by GI in the past and he  was sent to cardiology for a workup.  He states that when he has stomach  pain, and gurgling, and heartburn feeling, this is related to his heart  and the last time he complained of this he was found to have blockages  per catheterization.  Secondary to these continuations of symptoms x1  week with increasing weakness and this occurring at rest, the patient  presented to the emergency room.  The patient's wife, who was with him,  states that she has not noticed any shortness of breath with exertion.  She has noticed him breaking out in a sweat at rest.  He had been  complaining more of abdominal discomfort and gurgling than he did chest  discomfort.  The patient states that the chest discomfort and heaviness  has been occurring over the last couple of days with the worst event  during the evening hours of last night.   Of note, the patient did have a recent flu shot approximately 7 days  ago.  After receiving this flu shot he began to feel the discomfort in  abdomen and burning in his throat.  The patient states that the  abdominal discomfort became more progressive and he did begin to feel  the chest discomfort more so over the last 2 days.  The patient got  tired of it and decided to present to the emergency room.  The patient  did not report these symptoms to Dr. Lia Foyer, however.   REVIEW OF SYSTEMS:  Positive for abdominal pain and chest pressure  midsternal with diaphoresis, otherwise negative.   PAST  MEDICAL HISTORY:  1. Severe native vessel disease.  2. Hypertension.  3. Renal insufficiency newly diagnosed.  4. Hyperlipidemia.  5. Diabetes type 2.  6. Emphysema.  7. Prostatitis.  8. Hypercholesterolemia.  9. Gout.   PAST SURGICAL HISTORY:  1. Coronary artery bypass grafting in 1996 with SVG to LAD, SVG to OM,      and SVG to PDA.  2. Redo of coronary artery bypass grafting in March 2007 with SVG to      PDA and SVG to OM of left circumflex.  3. Past cardiac evaluation.  The patient did have a cardiac      catheterization in February 2007 per Dr. Bing Quarry.      a.     Left main free of critical disease.  LAD is diffusely       diseased in the mid portion.  The circumflex itself is totally       occluded and there is a diagonal with about 35% narrowing.  There       is competitive filling of the distal LAD noted antegrade.  The       internal mammary to the distal LAD is widely patent.  The       saphenous vein graft to the diagonal is diffusely diseased.  There       is a segmental 95% narrowing proximally.  There is a  diffuse       irregularity throughout the mid portion of graft and then a 70%       area of segmental plaquing throughout the mid distal portion of       the graft.  The saphenous vein graft to the obtuse marginal system       demonstrates a previously patent stent.  There is a new stenosis       of about 90% noted in the mid distal portion of the graft.  The       distal distribution of the vessel was quite large.  The right       coronary artery demonstrated 70% to 80% narrowing that is totally       occluded.  The saphenous vein graft to the distal right has a       stent proximally.  There is about a 70% acentric stenosis of the       stent.  Although it does not appear to be critical.  The remainder       of the graft remains patent.  Ventriculography in the RAO       projection using the hand injection reveals preserved global       systolic function.   The patient had an exercise treadmill test on November 16, 2005.  This study  was nondiagnostic due to failure to achieve 85% of age-predicted maximum  but was without significant ischemic change per Dr. Lia Foyer.   The patient did have an echocardiogram dated April 15, 2005,  revealing overall left ventricular systolic function was mildly  decreased.  Left ventricular ejection fraction was estimated range of  being 40% to 50%.  There was mild diffuse left ventricular hypokinesis,  aortic valve thickness was mildly increased, the right atrium was mildly  dilated.  EKG on admission:  Normal sinus rhythm without evidence of  acute ischemia.   LABORATORY DATA:  Point of care:  Hemoglobin 15.0, hematocrit 44.0.  Sodium 140, potassium 3.8, chloride 108, BUN 25, creatinine 2.0, glucose  87.  Point-of-care troponin 0.05.  CK-MB  1.4.   PHYSICAL EXAMINATION:  VITAL SIGNS:  Blood pressure 143/81, pulse 72,  respirations 24, temperature 97.3, O2 sat 94% on room air. HEENT:  Head:  Normocephalic, atraumatic.  Eyes:  PERRLA.  Mouth:  Mucous  membranes are pink and moist.  Tongue is midline.  NECK:  Supple.  No JVD, no carotid bruits were appreciated.  CARDIOVASCULAR:  Regular rate and rhythm with 1/6 systolic murmur  auscultated, without rubs or gallops.  Pulses are 2+ and equal.  There  are no bruits appreciated in the abdomen.  LUNGS:  Essentially clear to auscultation with some mild bibasilar  crackles noted.  ABDOMEN:  Soft, obese, nontender, 2+ bowel sounds, no bruits are  appreciated, no guarding or rebound is appreciated.  EXTREMITIES:  No clubbing, cyanosis, or edema.  Dorsalis pedal pulses  and radial pulses are 1+ bilaterally.  SKIN:  Warm and dry.  NEURO:  Cranial nerves II-VIII are grossly intact.   IMPRESSION:  1. Unstable angina in the setting of known coronary artery disease.  2. History of coronary artery disease with coronary artery bypass      graft as above.  3. Diabetes.  4. Emphysema.  5. History of hypertension.  6. History of gout.  7. Recent diagnosis of renal insufficiency.   PLAN:  This is a 65 year old Caucasian male with known coronary artery  disease as listed above with an ejection fraction of 40% to 50%.  The  patient has been experiencing increased epigastric pain, gurgling, and  burning into his midsternal area.  We questioned if this is his angina  equivalent and since this is the type of pain he experienced prior to  having coronary artery bypass grafting.  His initial cardiac enzymes are  negative with some elevation in his BUN and creatinine.   We will hold his ACE inhibitor secondary to the elevated BUN and  creatinine.  We will hydrate him and consider addition of hydralazine or  Imdur.  The patient will follow up with an abdominal exam at Dr.  Maren Beach discretion.  We will keep him n.p.o. for possible cardiac  catheterization if cardiac enzymes are positive or if Dr. Lia Foyer would  like to proceed with this.  The patient's Actos will be on hold and we  will place him on  sliding scale and he will also be on heparin through  pharmacy protocol.  The patient has been seen and examined by Dr. Jenkins Rouge and this has been explained to the patient who verbalizes  understanding and is willing to be admitted.      Phill Myron. Purcell Nails, NP      Wallis Bamberg. Johnsie Cancel, MD, [facility]  Electronically Signed    KML/MEDQ  D:  05/12/2006  T:  05/13/2006  Job:  [id number]   cc:   Miguel Aschoff

## 2010-10-16 NOTE — Assessment & Plan Note (Signed)
[facility]                            CARDIOLOGY OFFICE NOTE   NAME:Viscomi, KAINE GILLYARD                       MRN:          [id number]  DATE:09/13/2006                            DOB:          May 26, 1946    Mr. Duffy is in for a followup.  In general, he has been stable.  He  denies any ongoing chest pain.  He did not bring his medicines today,  although they have not changed.   His medications include:  1. Lipitor 20 mg q.h.s.  2. Actos 15 mg daily.  3. Colchicine 0.6 mg b.i.d.  4. Allopurinol 100 mg two tablets daily.  5. Toprol-XL 50.  6. Aspirin 81 mg daily.  7. Multivitamin daily.  8. Omeprazole 20 mg daily.  9. Amlodipine 5 mg daily.   On his physical examination today, the blood pressure is 152/82, the  pulse is 63.  The lung fields are clear.  The cardiac rhythm is regular.  No significant murmur is noted but there is an S4 gallop.  There is no  extremity edema.   IMPRESSION:  1. Status post coronary bypass graft surgery for severe native      coronary artery disease.  2. Hypertension with modest control.  3. Recent abnormal renal function with differential kidney size with      possible low flow in both kidneys.   PLAN:  I am going to have [name] return to see Dr. Burt Knack.  He is off  ACE inhibitors currently and his last BUN and creatinine were 16 and  1.3.  However, with differential kidney size, it may be worthwhile to  consider doing a brief angiogram to visualize the renal arteries.  We  will make arrangements for his to come in and see Dr. Burt Knack.     Loretha Brasil. Lia Foyer, MD, [facility]  Electronically Signed    TDS/MedQ  DD: 09/22/2006  DT: 09/22/2006  Job #: [id number]

## 2010-10-16 NOTE — Cardiovascular Report (Signed)
NAME:  Ronald [name], Ronald [name]                          ACCOUNT NO.:  [id number]   MEDICAL RECORD NO.:  [id number]                   PATIENT TYPE:  OIB   LOCATION:  6501                                 FACILITY:  [facility]   PHYSICIAN:  Minus Breeding, M.D.                DATE OF BIRTH:  03/05/1946   DATE OF PROCEDURE:  11/15/2003  DATE OF DISCHARGE:  11/15/2003                              CARDIAC CATHETERIZATION   PRIMARY:  Dr. Miguel Aschoff.   PROCEDURE:  Left heart catheterization/coronary arteriography.   INDICATION:  Evaluate patient with chest pain.  The patient has had chest  pain for a few weeks.  It is sporadic.  He has had none in the last two or  three days.  He was referred electively to Dr. Ron Parker and then for this  procedure.   PROCEDURE NOTE:  Left heart catheterization was performed via the right  femoral artery.  A #5 French arterial sheath was inserted via the modified  Seldinger technique.  The preformed Judkins and a pigtail catheter were  utilized.  The patient tolerated the procedure well and left the lab in  stable condition.   RESULTS:   HEMODYNAMICS:  1. LV 218/25, AO 217/101.  2. Coronaries:  The left main was long and narrow.  The LAD had a long     proximal 80% stenosis.  There was an occluded diagonal seen to fill via     vein graft.  Circumflex was occluded proximally.  There was a large mid     marginal seen to fill via vein graft.  The right coronary artery was     occluded in the mid segment.  The distal vessel was filled via vein     graft.  3. Grafts:  LIMA to the LAD was widely patent.  The distal LAD had no high     grade lesions.  The saphenous vein graft in the diagonal was patent with     mid tandem 50% lesions.  The diagonal was somewhat small but free of high     grade disease.  The saphenous vein graft in the marginal had proximal 95%     stenosis.  The marginal was a large vessel and free of disease.     Saphenous vein graft to the right  coronary artery had proximal 95%     stenosis with long proximal 40% stenosis following this.  The PDA was     large and free of  high grade disease.   LEFT VENTRICULOGRAM:  The left ventriculogram was obtained in the RAO  projection.  The EF was 65% with mild inferior hypokinesis.   CONCLUSION:  Native three vessel coronary artery disease.  Patient grafts  with high grade lesions in 2 of 4 vessels.  He is currently asymptomatic.  Will plan an elective PCI.  He has had a mildly elevated creatinine in the  past at  1.6.  However, prior to this procedure it was 1.3.  Will bring him  back for an inpatient procedure.  He will be started on Plavix 150 mg  followed by 75 mg daily.  He will be started on Imdur 60 mg daily.  He will  take his aspirin, beta-blocker and ACE inhibitor.  We are going to make sure  to bring his blood pressure down prior to leaving the outpatient lab.  He  has already got two sublingual nitroglycerin to try to do this and also IV  Enalapril.  We are waiting to pull the sheath until his pressure is down.  We discussed the risks and benefits of this approach at length.  He would  like to go home and come back for this.  Again, he has not had any ongoing  symptoms in the last  couple of days.  He will be given sublingual nitroglycerin and instructions  to call 911 if he has any further pain.  Will check a creatinine on Monday  before the procedure and also use the sodium bicarb protocol.  If his  creatinine is at all elevated, will hydrate him over night and defer til  Tuesday.                                               Minus Breeding, M.D.    JH/MEDQ  D:  11/15/2003  T:  11/17/2003  Job:  [id number]   cc:   Miguel Aschoff  [address] [place] [address]  Fax: [phone]

## 2010-10-24 ENCOUNTER — Encounter: Payer: Self-pay | Admitting: Cardiology

## 2010-10-27 ENCOUNTER — Telehealth: Payer: Self-pay | Admitting: Cardiology

## 2010-10-27 NOTE — Telephone Encounter (Signed)
Per pt calling, C/O  not feeling well, stomach pain.

## 2010-10-27 NOTE — Telephone Encounter (Signed)
I spoke with the pt and he complains of stomach pain, CP, and left arm pain that occurs everyday for the past 2 weeks.  The pt said his symptoms are similar to what he had prior to his last cardiac cath.  The pt said his CP is 4-5/10 but he has not used NTG for his symptoms.  The pt does have some acid running up in his throat and said that his symptoms are worse when his stomach hurts.  The pt is scheduled to see Dr Lia Foyer next week but would like to see Dr Lia Foyer this week.  I scheduled the pt for an appointment on 10/29/10 at 4:30.  The pt was instructed to call the office if he had any worsening of symptoms prior to 10/29/10 appt. Pt agreed with plan.

## 2010-10-28 ENCOUNTER — Encounter: Payer: Self-pay | Admitting: *Deleted

## 2010-10-29 ENCOUNTER — Ambulatory Visit (INDEPENDENT_AMBULATORY_CARE_PROVIDER_SITE_OTHER): Payer: Medicare Other | Admitting: Cardiology

## 2010-10-29 ENCOUNTER — Encounter: Payer: Self-pay | Admitting: Cardiology

## 2010-10-29 VITALS — BP 130/54 | HR 69 | Resp 18 | Ht 71.0 in | Wt 232.8 lb

## 2010-10-29 DIAGNOSIS — R109 Unspecified abdominal pain: Secondary | ICD-10-CM

## 2010-10-29 DIAGNOSIS — E782 Mixed hyperlipidemia: Secondary | ICD-10-CM

## 2010-10-29 DIAGNOSIS — I701 Atherosclerosis of renal artery: Secondary | ICD-10-CM

## 2010-10-29 DIAGNOSIS — I2510 Atherosclerotic heart disease of native coronary artery without angina pectoris: Secondary | ICD-10-CM

## 2010-10-29 NOTE — Patient Instructions (Signed)
Your physician recommends that you schedule a follow-up appointment in: [place] physician recommends that you continue on your current medications as directed. Please refer to the Current Medication list given to you today.  Your physician recommends that you have lab work: Lipid, Liver and Magnesium (414.01, 272.0--order given to patient)  Please call for GI appointment.

## 2010-10-29 NOTE — Progress Notes (Signed)
HPI:  He has had some recurrence of abdominal discomfort that occurs after eating.  His stomach roars and growls, he has a lot of gas, and notes that when he eats he hurts in the mid epigastrium.  He has some known abdominal vascular disease.  He has had a cholecystectomy.  He has some discomfort also up under the right rib margin, and rarely in the chest.  He notes also a sour taste in the throat.  He does not have exertional chest tightness.    Current Outpatient Prescriptions  Medication Sig Dispense Refill  . allopurinol (ZYLOPRIM) 100 MG tablet Take 100 mg by mouth daily.        [name] amLODipine (NORVASC) 10 MG tablet Take 10 mg by mouth daily.        [name] aspirin 81 MG tablet Take 81 mg by mouth daily.        [name] atorvastatin (LIPITOR) 20 MG tablet Take 20 mg by mouth at bedtime.        . carvedilol (COREG) 12.5 MG tablet Take 12.5 mg by mouth 2 (two) times daily with a meal.        . magnesium oxide (MAG-OX) 400 MG tablet Take 400 mg by mouth 2 (two) times daily.        . Multiple Vitamin (MULTIVITAMIN) tablet Take 1 tablet by mouth daily.        . nitroGLYCERIN (NITROSTAT) 0.4 MG SL tablet Place 0.4 mg under the tongue every 5 (five) minutes as needed. May repeat up to three times.       [name] omeprazole (PRILOSEC OTC) 20 MG tablet Take 20 mg by mouth daily.        . diphenhydramine-acetaminophen (TYLENOL PM EXTRA STRENGTH) 25-500 MG TABS Take 1 tablet by mouth daily as needed.        [name] DISCONTD: metoprolol (TOPROL-XL) 50 MG 24 hr tablet Take 25 mg by mouth daily.        [name] DISCONTD: pioglitazone (ACTOS) 15 MG tablet Take 15 mg by mouth daily.          Allergies  Allergen Reactions  . Hydrochlorothiazide   . Hydrocodone   . Hydrocodone-Acetaminophen   . Penicillins     Past Medical History  Diagnosis Date  . CAD (coronary artery disease) 2007    Post bypass and subsequent redo bypass in 07  . Ischemic cardiomyopathy 2006    EF 40% to 50% by 2D echo in 2006  . Chronic kidney disease    Creatinine 1.9 to 2  . Hyperlipidemia   . Hypertension   . Renal artery stenosis   . Gout   . Emphysema   . COPD (chronic obstructive pulmonary disease)     Past Surgical History  Procedure Date  . Coronary artery bypass graft 1996  . Coronary artery bypass graft March 2007    Redo of CABG from 1996  . Lung repair 1996    Family History  Problem Relation Age of Onset  . Heart attack Mother     MI  . Stroke Mother   . Heart disease Father   . Rheumatic fever Father     History   Social History  . Marital Status: Married    Spouse Name: N/A    Number of Children: N/A  . Years of Education: N/A   Occupational History  . Not on file.   Social History Main Topics  . Smoking status: Former Smoker -- 3.0 packs/day for 20 years  Quit date: 05/31/1988  . Smokeless tobacco: Not on file  . Alcohol Use: Yes  . Drug Use: No  . Sexually Active: Not on file   Other Topics Concern  . Not on file   Social History Narrative  . No narrative on file    ROS: Please see the HPI.  All other systems reviewed and negative.  PHYSICAL EXAM:  BP 130/54  Pulse 69  Resp 18  Ht 5\' 11"  (1.803 m)  Wt 232 lb 12.8 oz (105.597 kg)  BMI 32.47 kg/m2  General: Well developed, well nourished, in no acute distress. Head:  Normocephalic and atraumatic. Neck: no JVD Lungs: Clear to auscultation and percussion. Heart: Normal S1 and S2.  No murmur, rubs or gallops.  Abdomen:  Normal bowel sounds; soft; non tender; no organomegaly.  Protuberant abdomen.   Pulses: Pulses normal in all 4 extremities. Extremities: No clubbing or cyanosis. No edema. Neurologic: Alert and oriented x 3.  EKG: NSR.  WNL.  Rate is 72 bpm.  ASSESSMENT AND PLAN:

## 2010-10-30 ENCOUNTER — Telehealth: Payer: Self-pay | Admitting: Cardiology

## 2010-10-30 DIAGNOSIS — R109 Unspecified abdominal pain: Secondary | ICD-10-CM

## 2010-10-30 NOTE — Telephone Encounter (Signed)
Pt wife calling to set up an appt for a GI doctor for the pt. Pt wife states Ander Purpura was going to help set an appt for the pt.

## 2010-11-02 ENCOUNTER — Encounter: Payer: Self-pay | Admitting: Cardiology

## 2010-11-02 NOTE — Telephone Encounter (Signed)
I spoke with the patient and made him aware that Ronald [name] has been out of the office. I will forward the message to her to see where things are in this process. The patient verbalizes understanding.

## 2010-11-04 ENCOUNTER — Ambulatory Visit: Payer: Self-pay | Admitting: Cardiology

## 2010-11-09 DIAGNOSIS — R109 Unspecified abdominal pain: Secondary | ICD-10-CM | POA: Insufficient documentation

## 2010-11-09 NOTE — Assessment & Plan Note (Addendum)
Probably due for lipid and liver profile. Will check and see.

## 2010-11-09 NOTE — Assessment & Plan Note (Signed)
Does not currently seem to have symptoms, but needs to be monitored.

## 2010-11-09 NOTE — Telephone Encounter (Signed)
Left message to make pt aware that I will place an order to refer pt to [facility] due to symptoms. (This pt had a GI physician that he wanted to see closer to home, but must not have been able to get an appointment)

## 2010-11-09 NOTE — Assessment & Plan Note (Addendum)
See cath and CT data.  Has seen a nephrologist, with some debate as to best approach. BP is currently controlled.

## 2010-11-09 NOTE — Assessment & Plan Note (Signed)
Seems to be the most troubling.  One wonders about the possibility of progressive atherosclerotic disease, but many of his symptoms are fairly standard GI type complaints, with bloating, gas, etc. He is trying to get a GI appointment.  May  2008    Impression:   1. 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 Consider   interventional   radiology consultation [phone]) as the lesions may be amenable to   percutaneous   treatment if confirmed to be significant.   2. Mild ostial stenosis of the middle right and inferior left renal   arteries of   doubtful hemodynamic significance.   3. Origin IMA stenosis. Widely patent celiac axis and SMA likely   prevent this   from being clinically significant.    Read By:  Vernard Gambles, D. Quillian Quince,  M.D.

## 2010-11-11 NOTE — Telephone Encounter (Signed)
I contacted [facility] and this pt has seen Dr Deatra Ina in 2007.  The first available appointment they have is July 25th at 10:00.  I made the pt's wife aware of this information and she will call the GI office to check for cancellations to try and arrange an earlier appointment.

## 2010-12-23 ENCOUNTER — Encounter: Payer: Self-pay | Admitting: Gastroenterology

## 2010-12-23 ENCOUNTER — Ambulatory Visit (INDEPENDENT_AMBULATORY_CARE_PROVIDER_SITE_OTHER): Payer: Medicare Other | Admitting: Gastroenterology

## 2010-12-23 DIAGNOSIS — R109 Unspecified abdominal pain: Secondary | ICD-10-CM

## 2010-12-23 NOTE — Progress Notes (Signed)
History of Present Illness:  Ronald [name] is a pleasant 65 year old white male referred at the request of Dr. [name] for evaluation of abdominal pain. For approximately 6 weeks he was complaining of periumbilical pain. Pain was spontaneous and without radiation, of  moderate intensity and fairly chronic. It subsided spontaneously and he has been symptom-free for at least one month.  He is on no gastric irritants including nonsteroidals. There is no history of ulcer disease. Screening colonoscopy in 2006 was negative. Upper endoscopy for abdominal pain in 2007 was normal.    Review of Systems: Pertinent positive and negative review of systems were noted in the above HPI section. All other review of systems were otherwise negative.    Current Medications, Allergies, Past Medical History, Past Surgical History, Family History and Social History were reviewed in [facility] record  Vital signs were reviewed in today's medical record. Physical Exam: General: Well developed , well nourished, no acute distress Head: Normocephalic and atraumatic Eyes:  sclerae anicteric, EOMI Ears: Normal auditory acuity Mouth: No deformity or lesions Lungs: Clear throughout to auscultation Heart: Regular rate and rhythm; no murmurs, rubs or bruits Abdomen: Soft, non distended. No masses, hepatosplenomegaly or hernias noted. Normal Bowel sounds; there is minimal tenderness in the midepigastrium to palpation Rectal:deferred Musculoskeletal: Symmetrical with no gross deformities  Pulses:  Normal pulses noted Extremities: No clubbing, cyanosis, or deformities noted; there is trace pretibial edema Neurological: Alert oriented x 4, grossly nonfocal Psychological:  Alert and cooperative. Normal mood and affect

## 2010-12-23 NOTE — Assessment & Plan Note (Addendum)
The patient had very nonspecific abdominal pain which has subsided. At this point no further workup is required. I carefully instructed the patient to contact me if pain recurs at which point I would consider  anticholinergics or possibly upper endoscopy.

## 2010-12-23 NOTE — Patient Instructions (Signed)
Call back as needed 

## 2011-01-25 ENCOUNTER — Ambulatory Visit: Payer: Self-pay | Admitting: Gastroenterology

## 2011-01-25 ENCOUNTER — Ambulatory Visit: Payer: Self-pay | Admitting: Cardiology

## 2011-06-14 ENCOUNTER — Encounter: Payer: Self-pay | Admitting: Gastroenterology

## 2011-06-15 ENCOUNTER — Encounter: Payer: Self-pay | Admitting: Gastroenterology

## 2011-12-30 ENCOUNTER — Ambulatory Visit: Payer: Self-pay | Admitting: Family Medicine

## 2012-01-11 ENCOUNTER — Ambulatory Visit: Payer: Self-pay | Admitting: Family Medicine

## 2012-02-10 ENCOUNTER — Encounter: Payer: Self-pay | Admitting: Cardiology

## 2012-07-13 ENCOUNTER — Ambulatory Visit: Payer: Medicare Other | Admitting: Cardiology

## 2012-07-18 ENCOUNTER — Ambulatory Visit (INDEPENDENT_AMBULATORY_CARE_PROVIDER_SITE_OTHER): Payer: Medicare Other | Admitting: Cardiology

## 2012-07-18 ENCOUNTER — Encounter: Payer: Self-pay | Admitting: Cardiology

## 2012-07-18 VITALS — BP 124/76 | HR 69 | Ht 70.0 in | Wt 243.0 lb

## 2012-07-18 DIAGNOSIS — E782 Mixed hyperlipidemia: Secondary | ICD-10-CM

## 2012-07-18 DIAGNOSIS — N183 Chronic kidney disease, stage 3 unspecified: Secondary | ICD-10-CM

## 2012-07-18 DIAGNOSIS — I2510 Atherosclerotic heart disease of native coronary artery without angina pectoris: Secondary | ICD-10-CM

## 2012-07-18 DIAGNOSIS — Z951 Presence of aortocoronary bypass graft: Secondary | ICD-10-CM

## 2012-07-18 NOTE — Progress Notes (Signed)
HPI:  This nice patient seen today in a followup visit. From a cardiac standpoint he really has been quite stable. His kidney function has deteriorated some, at least according to the patient. He is being followed by nephrology in [place]. He denies any significant cardiac symptoms. His last redo bypass was in 2007.  Current Outpatient Prescriptions  Medication Sig Dispense Refill  . allopurinol (ZYLOPRIM) 100 MG tablet Take 100 mg by mouth daily.        [name] amLODipine (NORVASC) 10 MG tablet Take 10 mg by mouth daily.        [name] aspirin 81 MG tablet Take 81 mg by mouth daily.        [name] atorvastatin (LIPITOR) 20 MG tablet Take 20 mg by mouth at bedtime.        . carvedilol (COREG) 12.5 MG tablet Take 12.5 mg by mouth 2 (two) times daily with a meal.        . diphenhydramine-acetaminophen (TYLENOL PM EXTRA STRENGTH) 25-500 MG TABS Take 1 tablet by mouth daily as needed.        . magnesium oxide (MAG-OX) 400 MG tablet Take 400 mg by mouth 2 (two) times daily.        . Multiple Vitamin (MULTIVITAMIN) tablet Take 1 tablet by mouth daily.        . nitroGLYCERIN (NITROSTAT) 0.4 MG SL tablet Place 0.4 mg under the tongue every 5 (five) minutes as needed. May repeat up to three times.       [name] omeprazole (PRILOSEC OTC) 20 MG tablet Take 20 mg by mouth 2 (two) times daily.        No current facility-administered medications for this visit.    Allergies  Allergen Reactions  . Hydrochlorothiazide   . Hydrocodone   . Hydrocodone-Acetaminophen   . Penicillins     Past Medical History  Diagnosis Date  . CAD (coronary artery disease) 2007    Post bypass and subsequent redo bypass in 07  . Ischemic cardiomyopathy 2006    EF 40% to 50% by 2D echo in 2006  . Chronic kidney disease     Creatinine 1.9 to 2  . Hyperlipidemia   . Hypertension   . Renal artery stenosis   . Gout   . Emphysema   . COPD (chronic obstructive pulmonary disease)   . Diabetes mellitus     Diet control   . Arthritis   .  GERD (gastroesophageal reflux disease)   . Esophageal stricture 07/02/1998    EGD    Past Surgical History  Procedure Laterality Date  . Coronary artery bypass graft  1996  . Coronary artery bypass graft  March 2007    Redo of CABG from 1996  . Lung repair  1996  . Cholecystectomy      Family History  Problem Relation Age of Onset  . Heart attack Mother     MI  . Stroke Mother   . Heart disease Father   . Rheumatic fever Father     History   Social History  . Marital Status: Married    Spouse Name: N/A    Number of Children: 2  . Years of Education: N/A   Occupational History  . Retired    Social History Main Topics  . Smoking status: Former Smoker -- 3.00 packs/day for 20 years    Quit date: 05/31/1988  . Smokeless tobacco: Never Used  . Alcohol Use: No  . Drug Use: No  .  Sexually Active: Not on file   Other Topics Concern  . Not on file   Social History Narrative   3 caffeine drinks daily    ROS: Please see the HPI.  All other systems reviewed and negative.  PHYSICAL EXAM:  BP 124/76  Pulse 69  Ht 5\' 10"  (1.778 m)  Wt 243 lb (110.224 kg)  BMI 34.87 kg/m2  SpO2 98%  General: Well developed, well nourished, in no acute distress. Head:  Normocephalic and atraumatic. Neck: no JVD Lungs: Clear to auscultation and percussion. Heart: Normal S1 and S2.  No murmur, rubs or gallops.  Abdomen:  Protuberant abdomen with no obvious masses.   Extremities: No clubbing or cyanosis. No edema. Neurologic: Alert and oriented x 3.  EKG:  NSR.  WNL.    ASSESSMENT AND PLAN:

## 2012-07-18 NOTE — Patient Instructions (Signed)
Your physician wants you to follow-up in: 1 YEAR with Dr Rockey Situ (previous pt of Dr Lia Foyer). You will receive a reminder letter in the mail two months in advance. If you don't receive a letter, please call our office to schedule the follow-up appointment.  Your physician recommends that you continue on your current medications as directed. Please refer to the Current Medication list given to you today.

## 2012-07-18 NOTE — Assessment & Plan Note (Addendum)
The patient has remained on statin therapy given two prior CABG operations.  He has abnormal liver function studies, but is followed by Dr.Gilbert.  Prior transaminases here were normal.  I have not seen the patient in a couple of years, so will review with his primary MD.

## 2012-07-18 NOTE — Assessment & Plan Note (Addendum)
This delightful gentleman is continued to remain stable from a cardiac standpoint. He's not currently having any symptoms. He underwent redo artery bypass graft surgery in 2007, and his continued to remain stable since that time. His present electrocardiogram is normal, and he currently has no symptoms.  As noted, the patient's last ejection fraction was in the 45-50% range. He continues to be followed closely by his primary care Dr., and also the nephrologist. He seen Dr. Rockey Situ, and we will make arrangements for him to see Dr. Rockey Situ back in followup clinic in [place] in approximately one year. Should he have any problems in the interim he is to contact our office.

## 2012-07-28 NOTE — Assessment & Plan Note (Signed)
Labs not available to me, but he is being followed by nephrology in [place] closely.

## 2012-10-24 DIAGNOSIS — R339 Retention of urine, unspecified: Secondary | ICD-10-CM | POA: Insufficient documentation

## 2012-10-24 DIAGNOSIS — N3941 Urge incontinence: Secondary | ICD-10-CM | POA: Insufficient documentation

## 2012-10-24 DIAGNOSIS — N138 Other obstructive and reflux uropathy: Secondary | ICD-10-CM | POA: Insufficient documentation

## 2012-10-24 DIAGNOSIS — N401 Enlarged prostate with lower urinary tract symptoms: Secondary | ICD-10-CM | POA: Insufficient documentation

## 2012-10-25 DIAGNOSIS — B3749 Other urogenital candidiasis: Secondary | ICD-10-CM

## 2012-10-25 HISTORY — DX: Other urogenital candidiasis: B37.49

## 2012-12-30 ENCOUNTER — Observation Stay (HOSPITAL_COMMUNITY)
Admission: EM | Admit: 2012-12-30 | Discharge: 2012-12-31 | Disposition: A | Discharge status: Home / Self Care | Payer: Medicare Other | Attending: Cardiology | Admitting: Cardiology

## 2012-12-30 ENCOUNTER — Emergency Department (HOSPITAL_COMMUNITY): Payer: Medicare Other

## 2012-12-30 ENCOUNTER — Encounter (HOSPITAL_COMMUNITY): Payer: Self-pay

## 2012-12-30 DIAGNOSIS — Z951 Presence of aortocoronary bypass graft: Secondary | ICD-10-CM

## 2012-12-30 DIAGNOSIS — E782 Mixed hyperlipidemia: Secondary | ICD-10-CM | POA: Diagnosis present

## 2012-12-30 DIAGNOSIS — Z79899 Other long term (current) drug therapy: Secondary | ICD-10-CM | POA: Insufficient documentation

## 2012-12-30 DIAGNOSIS — I2510 Atherosclerotic heart disease of native coronary artery without angina pectoris: Secondary | ICD-10-CM

## 2012-12-30 DIAGNOSIS — R079 Chest pain, unspecified: Secondary | ICD-10-CM

## 2012-12-30 DIAGNOSIS — R0789 Other chest pain: Principal | ICD-10-CM | POA: Diagnosis present

## 2012-12-30 DIAGNOSIS — N184 Chronic kidney disease, stage 4 (severe): Secondary | ICD-10-CM | POA: Insufficient documentation

## 2012-12-30 DIAGNOSIS — I119 Hypertensive heart disease without heart failure: Secondary | ICD-10-CM | POA: Diagnosis present

## 2012-12-30 DIAGNOSIS — I1310 Hypertensive heart and chronic kidney disease without heart failure, with stage 1 through stage 4 chronic kidney disease, or unspecified chronic kidney disease: Secondary | ICD-10-CM | POA: Insufficient documentation

## 2012-12-30 DIAGNOSIS — E1129 Type 2 diabetes mellitus with other diabetic kidney complication: Secondary | ICD-10-CM | POA: Insufficient documentation

## 2012-12-30 DIAGNOSIS — E1121 Type 2 diabetes mellitus with diabetic nephropathy: Secondary | ICD-10-CM | POA: Diagnosis present

## 2012-12-30 DIAGNOSIS — I701 Atherosclerosis of renal artery: Secondary | ICD-10-CM | POA: Diagnosis present

## 2012-12-30 LAB — CBC
HCT: 40.3 % (ref 39.0–52.0)
Hemoglobin: 14.4 g/dL (ref 13.0–17.0)
MCH: 30.1 pg (ref 26.0–34.0)
MCH: 29.7 pg (ref 26.0–34.0)
MCHC: 35.7 g/dL (ref 30.0–36.0)
MCV: 84.1 fL (ref 78.0–100.0)
MCV: 83.8 fL (ref 78.0–100.0)
Platelets: 156 10*3/uL (ref 150–400)
Platelets: 140 10*3/uL — ABNORMAL LOW (ref 150–400)
RBC: 4.79 MIL/uL (ref 4.22–5.81)
RDW: 14 % (ref 11.5–15.5)
RDW: 14 % (ref 11.5–15.5)
WBC: 7.6 10*3/uL (ref 4.0–10.5)
WBC: 7.1 10*3/uL (ref 4.0–10.5)

## 2012-12-30 LAB — BASIC METABOLIC PANEL
GFR calc Af Amer: 34 mL/min — ABNORMAL LOW (ref >90)
GFR calc non Af Amer: 29 mL/min — ABNORMAL LOW (ref >90)
Potassium: 4.5 mEq/L (ref 3.5–5.1)
Sodium: 138 mEq/L (ref 135–145)

## 2012-12-30 LAB — COMPREHENSIVE METABOLIC PANEL
ALT: 21 U/L (ref 0–53)
CO2: 23 mEq/L (ref 19–32)
Calcium: 9.6 mg/dL (ref 8.4–10.5)
GFR calc Af Amer: 38 mL/min — ABNORMAL LOW (ref >90)
GFR calc non Af Amer: 33 mL/min — ABNORMAL LOW (ref >90)
Glucose, Bld: 100 mg/dL — ABNORMAL HIGH (ref 70–99)
Sodium: 141 mEq/L (ref 135–145)

## 2012-12-30 LAB — POCT I-STAT TROPONIN I: Troponin i, poc: 0 ng/mL (ref 0.00–0.08)

## 2012-12-30 MED ORDER — ASPIRIN 81 MG PO CHEW
81.0000 mg | CHEWABLE_TABLET | Freq: Every morning | ORAL | Status: DC
  Administered 2012-12-31: 81 mg via ORAL
  Filled 2012-12-30: qty 1

## 2012-12-30 MED ORDER — ATORVASTATIN CALCIUM 20 MG PO TABS
20.0000 mg | ORAL_TABLET | Freq: Every day | ORAL | Status: DC
  Administered 2012-12-30: 20 mg via ORAL
  Filled 2012-12-30 (×2): qty 1

## 2012-12-30 MED ORDER — PANTOPRAZOLE SODIUM 40 MG PO TBEC
40.0000 mg | DELAYED_RELEASE_TABLET | Freq: Every day | ORAL | Status: DC
  Administered 2012-12-30 – 2012-12-31 (×2): 40 mg via ORAL
  Filled 2012-12-30 (×2): qty 1

## 2012-12-30 MED ORDER — AMLODIPINE BESYLATE 10 MG PO TABS
10.0000 mg | ORAL_TABLET | Freq: Every morning | ORAL | Status: DC
  Administered 2012-12-31: 10 mg via ORAL
  Filled 2012-12-30: qty 1

## 2012-12-30 MED ORDER — NITROGLYCERIN 0.4 MG SL SUBL: 0.4000 mg | SUBLINGUAL_TABLET | SUBLINGUAL | Status: DC | PRN

## 2012-12-30 MED ORDER — MAGNESIUM OXIDE 400 (241.3 MG) MG PO TABS
400.0000 mg | ORAL_TABLET | Freq: Two times a day (BID) | ORAL | Status: DC
Start: 2012-12-30 — End: 2012-12-31
  Administered 2012-12-30 – 2012-12-31 (×2): 400 mg via ORAL
  Filled 2012-12-30 (×3): qty 1

## 2012-12-30 MED ORDER — ENOXAPARIN SODIUM 30 MG/0.3ML ~~LOC~~ SOLN: 30.0000 mg | SUBCUTANEOUS | Status: DC

## 2012-12-30 MED ORDER — SODIUM CHLORIDE 0.9 % IJ SOLN
3.0000 mL | Freq: Two times a day (BID) | INTRAMUSCULAR | Status: DC
  Administered 2012-12-30 – 2012-12-31 (×3): 3 mL via INTRAVENOUS

## 2012-12-30 MED ORDER — ALLOPURINOL 100 MG PO TABS
100.0000 mg | ORAL_TABLET | Freq: Every day | ORAL | Status: DC
  Administered 2012-12-30: 100 mg via ORAL
  Filled 2012-12-30 (×2): qty 1

## 2012-12-30 MED ORDER — ENOXAPARIN SODIUM 40 MG/0.4ML ~~LOC~~ SOLN
40.0000 mg | SUBCUTANEOUS | Status: DC
  Administered 2012-12-30: 40 mg via SUBCUTANEOUS
  Filled 2012-12-30 (×2): qty 0.4

## 2012-12-30 MED ORDER — HYOSCYAMINE SULFATE 0.125 MG PO TABS
0.1250 mg | ORAL_TABLET | Freq: Two times a day (BID) | ORAL | Status: DC | PRN
  Filled 2012-12-30: qty 1

## 2012-12-30 MED ORDER — CARVEDILOL 12.5 MG PO TABS
12.5000 mg | ORAL_TABLET | Freq: Two times a day (BID) | ORAL | Status: DC
  Administered 2012-12-30 – 2012-12-31 (×2): 12.5 mg via ORAL
  Filled 2012-12-30 (×4): qty 1

## 2012-12-30 MED ORDER — SODIUM CHLORIDE 0.9 % IJ SOLN: 3.0000 mL | INTRAMUSCULAR | Status: DC | PRN

## 2012-12-30 MED ORDER — ACETAMINOPHEN 325 MG PO TABS: 650.0000 mg | ORAL_TABLET | ORAL | Status: DC | PRN

## 2012-12-30 MED ORDER — SODIUM CHLORIDE 0.9 % IV SOLN: 250.0000 mL | INTRAVENOUS | Status: DC | PRN

## 2012-12-30 MED ORDER — SODIUM BICARBONATE 650 MG PO TABS
650.0000 mg | ORAL_TABLET | Freq: Every morning | ORAL | Status: DC
  Administered 2012-12-31: 650 mg via ORAL
  Filled 2012-12-30: qty 1

## 2012-12-30 NOTE — ED Notes (Addendum)
Pt c/o recurrent intermittent CP for two weeks.  Pt associates SOB, dizziness, lightheadedness, weakness with the CP.  Pt wanted to be checked before going on vacation today.  Pt denies pain at this time.

## 2012-12-30 NOTE — H&P (Signed)
History and Physical   Admit date: 12/30/2012 Name:  Ronald [name] Medical record number: [id number] DOB/Age:  05-Jun-1945  67 y.o. male  Referring Physician:   [facility]  Primary Cardiologist: [facility]  Primary Physician: Dr. Miguel Aschoff  Chief complaint/reason for admission: Chest pain and shortness of breath  HPI:  Patient seen for evaluation of multiple complaints. The patient has a prior history of coronary artery disease with bypass graft in 1999 for three-vessel disease with a mammary graft to LAD, a vein graft to diagonal, a vein graft to the circumflex, and a vein graft to right coronary artery. He developed some vein graft disease and had stenting done in the mid 2007 ultimately had redo bypass grafting in 2007 with a vein to the obtuse marginal and a vein to the right coronary artery. The diagonal was not grafted at that time. Because of recurrent symptoms and an abnormal stress test showing lateral ischemia he had repeat catheterization done in 2010 that showed a mammary graft to be patent to the LAD, vein graft to the right coronary artery to be patent and the vein graft to the marginal to be widely patent. It was felt to be disease in a small diagonal branch which filled retrograde that may have been a slight of ischemia. Incidentally was also noted that the old vein graft to the right coronary artery was patent.  He has also developed stage IV chronic kidney disease. He has mild LV dysfunction. He states he has not been well for about 2 weeks and describe lower abnormal pain and discomfort. He went to see his primary care doctor who placed him on Tikosyn main and told me and irritable bowel syndrome. He also has had some vague dyspnea and noticed that his symptoms of chest pain were described as sharp lasting less than a few seconds. He has not had any of the previous symptoms like he had prior to bypass. 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  He developed some left arm discomfort this morning and is simply not felt well complaining of malaise as well as dyspnea and was getting ready to go on vacation and decided to come to the emergency room for evaluation. The emergency room physician felt that he needs cardiology admission.    Past Medical History  Diagnosis Date  . CAD (coronary artery disease) 2007    Post bypass and subsequent redo bypass in 07  . Ischemic cardiomyopathy 2006    EF 40% to 50% by 2D echo in 2006  . Chronic kidney disease     Creatinine 1.9 to 2  . Hyperlipidemia   . Hypertension   . Renal artery stenosis   . Gout   . Emphysema   . COPD (chronic obstructive pulmonary disease)   . Diabetes mellitus     Diet control   . Arthritis   . GERD (gastroesophageal reflux disease)   . Esophageal stricture 07/02/1998    EGD       Past Surgical History  Procedure Laterality Date  . Coronary artery bypass graft  1996  . Coronary artery bypass graft  March 2007    Redo of CABG from 1996  . Lung repair  1996  . Cholecystectomy    .  Allergies: is allergic to hydrochlorothiazide; hydrocodone; hydrocodone-acetaminophen; and penicillins.   Medications: Prior to Admission medications   Medication Sig Start Date End Date Taking? Authorizing Provider  allopurinol (ZYLOPRIM) 100 MG tablet Take 100 mg by mouth at bedtime.    Yes Historical Provider, MD  amLODipine (NORVASC) 10 MG tablet Take 10 mg by mouth every morning.    Yes Historical Provider, MD  aspirin 81 MG tablet Take 81 mg by mouth every morning.    Yes Historical Provider, MD  atorvastatin (LIPITOR) 20 MG tablet Take 20 mg by mouth at bedtime.    Yes Historical Provider, MD  carvedilol (COREG) 12.5 MG tablet Take 12.5 mg by mouth 2 (two) times daily with a meal.    Yes Historical Provider, MD  hyoscyamine (LEVSIN, ANASPAZ) 0.125 MG tablet Take 0.125 mg by mouth 2 (two) times daily as needed (Irritable  bowel syndrome).   Yes Historical Provider, MD  magnesium oxide (MAG-OX) 400 MG tablet Take 400 mg by mouth 2 (two) times daily.     Yes Historical Provider, MD  Multiple Vitamin (MULTIVITAMIN) tablet Take 1 tablet by mouth every morning.    Yes Historical Provider, MD  nitroGLYCERIN (NITROSTAT) 0.4 MG SL tablet Place 0.4 mg under the tongue every 5 (five) minutes as needed. May repeat up to three times.    Yes Historical Provider, MD  omeprazole (PRILOSEC) 20 MG capsule Take 20 mg by mouth 2 (two) times daily.   Yes Historical Provider, MD  sodium bicarbonate 325 MG tablet Take 650 mg by mouth every morning.   Yes Historical Provider, MD    Family History:  Family Status  Relation Status Death Age  . Father Deceased 12    died of rheumatic heart disease  . Mother Deceased 75    died of asthma  . Brother Alive     Social History:   reports that he quit smoking about 24 years ago. He has never used smokeless tobacco. He reports that he does not drink alcohol or use illicit drugs.   History   Social History Narrative   Did auto salvage work.     Review of Systems:  He has not felt well for a couple of weeks. His major complaint is indigestion. This morning he had a feeling of acid reflux and felt as if he was burping excessively this morning. He has had moderate nocturia and has some mild arthritis. Other than as noted above, the remainder of the review of systems is normal  Physical Exam: BP 134/56  Pulse 63  Temp(Src) 97.8 F (36.6 C) (Oral)  Resp 20  SpO2 96% General appearance: Pleasant white male who is mildly obese and currently in no acute distress Head: Normocephalic, without obvious abnormality, atraumatic, Balding male hair pattern Eyes: conjunctivae/corneas clear. PERRL, EOM's intact. Fundi not examined Throat: Several missing teeth Neck: no adenopathy, no carotid bruit, no JVD and supple, symmetrical, trachea midline Lungs: clear to auscultation  bilaterally Heart: regular rate and rhythm, S1, S2 normal, no murmur, click, rub or gallop Abdomen: soft, non-tender; bowel sounds normal; no masses,  no organomegaly and Moderately obese Rectal: deferred Extremities: extremities normal, atraumatic, no cyanosis or edema Pulses: 2+ and symmetric Skin: Skin color, texture, turgor normal. No rashes or lesions Neurologic: Grossly normal   Labs: CBC  Recent Labs  12/30/12 1105  WBC 7.6  RBC 4.79  HGB 14.4  HCT 40.3  PLT 156  MCV 84.1  MCH 30.1  MCHC 35.7  RDW 14.0   CMP   Recent Labs  12/30/12 1105  NA 138  K 4.5  CL 104  CO2 23  GLUCOSE 205*  BUN 31*  CREATININE 2.17*  CALCIUM 9.4  GFRNONAA 29*  GFRAA 34*   Troponin (Point of Care Test)  Recent Labs  12/30/12 1116  TROPIPOC 0.00   Thyroid  Lab Results  Component Value Date   TSH 3.220 10/16/2009    EKG: Sinus with PVCs, mild nonspecific ST changes in the lateral leads.   Radiology: Mild basilar atelectasis   IMPRESSIONS: 1. Somewhat atypical left arm and chest pain in a patient with previous redo bypass grafting 2. Ischemic cardiomyopathy 3. Dyspnea 4. Coronary artery disease with previous bypass grafting 5. Stage IV chronic kidney disease 6. History of gout 7. Hypertension 8. Significant GI upset  PLAN: Obtain serial cardiac enzymes and EKG, obtain echocardiogram and BNP because of dyspnea. Probable will need to have a Lexiscan Myoview prior to discharge  Signed: W. Doristine Church MD [facility]  12/30/2012, 2:57 PM

## 2012-12-30 NOTE — ED Provider Notes (Signed)
CSN: [id number]     Arrival date & time 12/30/12  1047 History     First MD Initiated Contact with Patient 12/30/12 1100     Chief Complaint  Patient presents with  . Chest Pain  . Shortness of Breath   (Consider location/radiation/quality/duration/timing/severity/associated sxs/prior Treatment) HPI Patient presents to the emergency department with chest pain, and shortness of breath, that is increased over the last 2 weeks.  Patient, states, that he's had some shortness breath, in the past with exertion, but it seemed to get worse over this time.  Patient has had 2 previous bypass surgeries.  Patient denies back pain, headache, blurred vision, fever, cough, nausea, vomiting, diarrhea, weakness, or syncope.  Patient, states, that he has sublingual nitro, but did not take any for his symptoms. The patient denies that anything makes his condition better. The patient states that he does also have a hiatal hernia and thought that may be causing the pain.  Past Medical History  Diagnosis Date  . CAD (coronary artery disease) 2007    Post bypass and subsequent redo bypass in 07  . Ischemic cardiomyopathy 2006    EF 40% to 50% by 2D echo in 2006  . Chronic kidney disease     Creatinine 1.9 to 2  . Hyperlipidemia   . Hypertension   . Renal artery stenosis   . Gout   . Emphysema   . COPD (chronic obstructive pulmonary disease)   . Diabetes mellitus     Diet control   . Arthritis   . GERD (gastroesophageal reflux disease)   . Esophageal stricture 07/02/1998    EGD   Past Surgical History  Procedure Laterality Date  . Coronary artery bypass graft  1996  . Coronary artery bypass graft  March 2007    Redo of CABG from 1996  . Lung repair  1996  . Cholecystectomy     Family History  Problem Relation Age of Onset  . Heart attack Mother     MI  . Stroke Mother   . Heart disease Father   . Rheumatic fever Father    History  Substance Use Topics  . Smoking status: Former Smoker --  3.00 packs/day for 20 years    Quit date: 05/31/1988  . Smokeless tobacco: Never Used  . Alcohol Use: No    Review of Systems All other systems negative except as documented in the HPI. All pertinent positives and negatives as reviewed in the HPI. Allergies  Hydrochlorothiazide; Hydrocodone; Hydrocodone-acetaminophen; and Penicillins  Home Medications   Current Outpatient Rx  Name  Route  Sig  Dispense  Refill  . allopurinol (ZYLOPRIM) 100 MG tablet   Oral   Take 100 mg by mouth at bedtime.          [name] amLODipine (NORVASC) 10 MG tablet   Oral   Take 10 mg by mouth every morning.          [name] aspirin 81 MG tablet   Oral   Take 81 mg by mouth every morning.          [name] atorvastatin (LIPITOR) 20 MG tablet   Oral   Take 20 mg by mouth at bedtime.          . carvedilol (COREG) 12.5 MG tablet   Oral   Take 12.5 mg by mouth 2 (two) times daily with a meal.          . hyoscyamine (LEVSIN, ANASPAZ) 0.125 MG tablet   Oral  Take 0.125 mg by mouth 2 (two) times daily as needed (Irritable bowel syndrome).         . magnesium oxide (MAG-OX) 400 MG tablet   Oral   Take 400 mg by mouth 2 (two) times daily.           . Multiple Vitamin (MULTIVITAMIN) tablet   Oral   Take 1 tablet by mouth every morning.          . nitroGLYCERIN (NITROSTAT) 0.4 MG SL tablet   Sublingual   Place 0.4 mg under the tongue every 5 (five) minutes as needed. May repeat up to three times.          [name] omeprazole (PRILOSEC) 20 MG capsule   Oral   Take 20 mg by mouth 2 (two) times daily.         . sodium bicarbonate 325 MG tablet   Oral   Take 650 mg by mouth every morning.          BP 134/56  Pulse 63  Temp(Src) 97.8 F (36.6 C) (Oral)  Resp 20  SpO2 96% Physical Exam  Nursing note and vitals reviewed. Constitutional: He is oriented to person, place, and time. He appears well-developed and well-nourished. No distress.  HENT:  Head: Normocephalic and atraumatic.   Mouth/Throat: Oropharynx is clear and moist.  Eyes: Pupils are equal, round, and reactive to light.  Neck: Normal range of motion. Neck supple.  Cardiovascular: Normal rate, regular rhythm and normal heart sounds.  Exam reveals no gallop and no friction rub.   No murmur heard. Pulmonary/Chest: Effort normal and breath sounds normal. No respiratory distress.  Neurological: He is alert and oriented to person, place, and time. He exhibits normal muscle tone. Coordination normal.  Skin: Skin is warm and dry. No rash noted. No erythema.    ED Course   Procedures (including critical care time)  Labs Reviewed  BASIC METABOLIC PANEL - Abnormal; Notable for the following:    Glucose, Bld 205 (*)    BUN 31 (*)    Creatinine, Ser 2.17 (*)    GFR calc non Af Amer 29 (*)    GFR calc Af Amer 34 (*)    All other components within normal limits  CBC  POCT I-STAT TROPONIN I   Dg Chest 2 View  12/30/2012   *RADIOLOGY REPORT*  Clinical Data: Chest pain, left arm numbness and shortness of breath  CHEST - 2 VIEW  Comparison: Prior chest x-ray 08/19/2008  Findings: Surgical changes of prior median sternotomy for multivessel CABG including LIMA bypass.  Inspiratory volumes are low and there is mild bibasilar atelectasis versus scarring.  No airspace consolidation, pleural effusion or pneumothorax.  Negative for pulmonary edema.  Surgical clips the right upper quadrant from prior cholecystectomy.  No acute osseous abnormality.  IMPRESSION: Low inspiratory volumes with mild bibasilar atelectasis versus scarring.  Overall, similar appearance of the chest compared to prior.   Original Report Authenticated By: Jacqulynn Cadet, M.D.   MDM  MDM Reviewed: nursing note, vitals and previous chart Reviewed previous: labs, ECG and x-ray Interpretation: labs, ECG and x-ray   Spoke with Dr. Wynonia Lawman of cardiology who will come down to admit the patient.    Date: 01/01/2013  Rate: 71  Rhythm: normal sinus rhythm   QRS Axis: normal  Intervals: normal  ST/T Wave abnormalities: nonspecific ST changes  Conduction Disutrbances:none  Narrative Interpretation:   Old EKG Reviewed: unchanged    Brent General, PA-C 01/01/13  1520 

## 2012-12-31 ENCOUNTER — Observation Stay (HOSPITAL_COMMUNITY): Payer: Medicare Other

## 2012-12-31 ENCOUNTER — Other Ambulatory Visit (HOSPITAL_COMMUNITY): Payer: Medicare Other

## 2012-12-31 ENCOUNTER — Encounter (HOSPITAL_COMMUNITY): Payer: Self-pay | Admitting: Physician Assistant

## 2012-12-31 DIAGNOSIS — R079 Chest pain, unspecified: Secondary | ICD-10-CM

## 2012-12-31 DIAGNOSIS — R0789 Other chest pain: Secondary | ICD-10-CM

## 2012-12-31 DIAGNOSIS — I517 Cardiomegaly: Secondary | ICD-10-CM

## 2012-12-31 DIAGNOSIS — I2510 Atherosclerotic heart disease of native coronary artery without angina pectoris: Secondary | ICD-10-CM

## 2012-12-31 LAB — TROPONIN I: Troponin I: <0.3 ng/mL (ref <0.30)

## 2012-12-31 LAB — GLUCOSE, CAPILLARY

## 2012-12-31 MED ORDER — REGADENOSON 0.4 MG/5ML IV SOLN
INTRAVENOUS | Status: AC
  Administered 2012-12-31: 0.4 mg
  Filled 2012-12-31: qty 5

## 2012-12-31 MED ORDER — TECHNETIUM TC 99M SESTAMIBI - CARDIOLITE
10.0000 | Freq: Once | INTRAVENOUS | Status: AC | PRN
  Administered 2012-12-31: 08:00:00 10 via INTRAVENOUS

## 2012-12-31 MED ORDER — TECHNETIUM TC 99M SESTAMIBI - CARDIOLITE
30.0000 | Freq: Once | INTRAVENOUS | Status: AC | PRN
  Administered 2012-12-31: 09:00:00 30 via INTRAVENOUS

## 2012-12-31 MED ORDER — REGADENOSON 0.4 MG/5ML IV SOLN
0.4000 mg | Freq: Once | INTRAVENOUS | Status: DC
  Filled 2012-12-31: qty 5

## 2012-12-31 MED ORDER — SODIUM CHLORIDE 0.9 % IJ SOLN
80.0000 mg | Freq: Once | INTRAVENOUS | Status: DC
  Filled 2012-12-31: qty 3.2

## 2012-12-31 NOTE — Progress Notes (Signed)
Patient currently in nuclear.  Enzymes negative  If nuclear is OK may d/c later.  Kerry Hough MD [facility]

## 2012-12-31 NOTE — Progress Notes (Signed)
History and Physical   Admit date: 12/30/2012 Name:  Ronald [name] [facility] Medical record number: [id number] DOB/Age:  Jul 17, 1945  67 y.o. male  Referring Physician:   [facility]  Primary Cardiologist: [facility]  Primary Physician: Dr. Miguel Aschoff  Chief complaint/reason for admission: Chest pain and shortness of breath  HPI:  Patient seen for evaluation of multiple complaints. The patient has a prior history of coronary artery disease with bypass graft in 1999 for three-vessel disease with a mammary graft to LAD, a vein graft to diagonal, a vein graft to the circumflex, and a vein graft to right coronary artery. He developed some vein graft disease and had stenting done in the mid 2007 ultimately had redo bypass grafting in 2007 with a vein to the obtuse marginal and a vein to the right coronary artery. The diagonal was not grafted at that time. Because of recurrent symptoms and an abnormal stress test showing lateral ischemia he had repeat catheterization done in 2010 that showed a mammary graft to be patent to the LAD, vein graft to the right coronary artery to be patent and the vein graft to the marginal to be widely patent. It was felt to be disease in a small diagonal branch which filled retrograde that may have been a slight of ischemia. Incidentally was also noted that the old vein graft to the right coronary artery was patent.  He has also developed stage IV chronic kidney disease. He has mild LV dysfunction. He states he has not been well for about 2 weeks and describe lower abnormal pain and discomfort. He went to see his primary care doctor who placed him on Tikosyn main and told me and irritable bowel syndrome. He also has had some vague dyspnea and noticed that his symptoms of chest pain were described as sharp lasting less than a few seconds. He has not had any of the previous symptoms like he had prior to bypass. 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  He developed some left arm discomfort this morning and is simply not felt well complaining of malaise as well as dyspnea and was getting ready to go on vacation and decided to come to the emergency room for evaluation. The emergency room physician felt that he needs cardiology admission.  Pt is feeling better.  Myoview is normal.  No ischemia.  EF 53%.     Past Medical History  Diagnosis Date  . CAD (coronary artery disease) 2007    Post bypass and subsequent redo bypass in 07  . Ischemic cardiomyopathy 2006    EF 40% to 50% by 2D echo in 2006  . Chronic kidney disease     Creatinine 1.9 to 2  . Hyperlipidemia   . Hypertension   . Renal artery stenosis   . Gout   . Emphysema   . COPD (chronic obstructive pulmonary disease)   . Diabetes mellitus     Diet control   . Arthritis   . GERD (gastroesophageal reflux disease)   . Esophageal stricture 07/02/1998    EGD  . Anginal pain   . Dysrhythmia     bigeminy pvcs  . CHF (congestive heart failure)        Past Surgical History  Procedure Laterality Date  . Coronary artery bypass graft  1996  . Coronary artery bypass graft  March 2007    Redo of CABG from 1996  . Lung repair  1996  . Cholecystectomy    .  Allergies: is allergic to hydrochlorothiazide; hydrocodone; hydrocodone-acetaminophen; and penicillins.   Medications: Prior to Admission medications   Medication Sig Start Date End Date Taking? Authorizing Provider  allopurinol (ZYLOPRIM) 100 MG tablet Take 100 mg by mouth at bedtime.    Yes Historical Provider, MD  amLODipine (NORVASC) 10 MG tablet Take 10 mg by mouth every morning.    Yes Historical Provider, MD  aspirin 81 MG tablet Take 81 mg by mouth every morning.    Yes Historical Provider, MD  atorvastatin (LIPITOR) 20 MG tablet Take 20 mg by mouth at bedtime.    Yes Historical Provider, MD  carvedilol (COREG) 12.5 MG tablet Take 12.5 mg by mouth 2 (two) times daily  with a meal.    Yes Historical Provider, MD  hyoscyamine (LEVSIN, ANASPAZ) 0.125 MG tablet Take 0.125 mg by mouth 2 (two) times daily as needed (Irritable bowel syndrome).   Yes Historical Provider, MD  magnesium oxide (MAG-OX) 400 MG tablet Take 400 mg by mouth 2 (two) times daily.     Yes Historical Provider, MD  Multiple Vitamin (MULTIVITAMIN) tablet Take 1 tablet by mouth every morning.    Yes Historical Provider, MD  nitroGLYCERIN (NITROSTAT) 0.4 MG SL tablet Place 0.4 mg under the tongue every 5 (five) minutes as needed. May repeat up to three times.    Yes Historical Provider, MD  omeprazole (PRILOSEC) 20 MG capsule Take 20 mg by mouth 2 (two) times daily.   Yes Historical Provider, MD  sodium bicarbonate 325 MG tablet Take 650 mg by mouth every morning.   Yes Historical Provider, MD    Physical Exam: BP 113/67  Pulse 71  Temp(Src) 98 F (36.7 C) (Oral)  Resp 18  Ht 5\' 11"  (1.803 m)  Wt 234 lb 14.4 oz (106.55 kg)  BMI 32.78 kg/m2  SpO2 97% General appearance: Pleasant white male who is mildly obese and currently in no acute distress Head: Normocephalic, without obvious abnormality, atraumatic, Balding male hair pattern Eyes: conjunctivae/corneas clear. PERRL, EOM's intact. Fundi not examined Throat: Several missing teeth Neck: no adenopathy, no carotid bruit, no JVD and supple, symmetrical, trachea midline Lungs: clear to auscultation bilaterally Heart: regular rate and rhythm, S1, S2 normal, no murmur, click, rub or gallop Abdomen: soft, non-tender; bowel sounds normal; no masses,  no organomegaly and Moderately obese Rectal: deferred Extremities: extremities normal, atraumatic, no cyanosis or edema Pulses: 2+ and symmetric Skin: normal Neurologic: Grossly normal   Labs: CBC  Recent Labs  12/30/12 1532  WBC 7.1  RBC 4.68  HGB 13.9  HCT 39.2  PLT 140*  MCV 83.8  MCH 29.7  MCHC 35.5  RDW 14.0   CMP   Recent Labs  12/30/12 1532  NA 141  K 4.5  CL 106   CO2 23  GLUCOSE 100*  BUN 29*  CREATININE 1.99*  CALCIUM 9.6  PROT 6.5  ALBUMIN 3.9  AST 22  ALT 21  ALKPHOS 184*  BILITOT 1.1  GFRNONAA 33*  GFRAA 38*   Troponin (Point of Care Test)  Recent Labs  12/30/12 1116  TROPIPOC 0.00   Thyroid  Lab Results  Component Value Date   TSH 3.220 10/16/2009    EKG: Sinus with PVCs, mild nonspecific ST changes in the lateral leads.   Radiology: Mild basilar atelectasis   IMPRESSIONS: 1. Somewhat atypical left arm and chest pain in a patient with previous redo bypass grafting 2. Ischemic cardiomyopathy 3. Dyspnea 4.  Coronary artery disease with previous bypass grafting 5. Stage IV chronic kidney disease 6. History of gout 7. Hypertension 8. Significant GI upset  PLAN: Pain is beter.  Stress myoview this am is normal.  OK to go home. Echo is being done at present.  We will give echo results as OP.  No acute abnormality.     Thayer Headings, Brooke Bonito., MD, [facility] 12/31/2012, 2:42 PM Office - [phone] Pager [phone]

## 2012-12-31 NOTE — Progress Notes (Signed)
  Echocardiogram 2D Echocardiogram has been performed.  Parul Porcelli, [place] 12/31/2012, 3:00 PM

## 2012-12-31 NOTE — Discharge Summary (Signed)
Discharge Summary   Patient ID: Ronald [name] [facility],  MRN: [id number], DOB/AGE: 1946-04-26 67 y.o.  Admit date: 12/30/2012 Discharge date: 12/31/2012  Primary Physician: Eulas Post, MD Primary Cardiologist: previously T. Lia Foyer, MD  Discharge Diagnoses Principal Problem:   Chest pain, atypical Active Problems:   Type 2 diabetes mellitus with renal manifestations   Mixed hyperlipidemia   Renal artery stenosis    History of redo bypass grafting   Coronary artery disease   Chronic kidney disease (CKD), stage IV (severe)   Hypertensive heart disease without CHF   Allergies Allergies  Allergen Reactions  . Hydrochlorothiazide     Dehydration  . Hydrocodone     Stomach upset  . Hydrocodone-Acetaminophen     Stomach upset  . Penicillins Hives    Diagnostic Studies/Procedures  PA/LATERAL CHEST X-RAY - 12/30/12  IMPRESSION:  Low inspiratory volumes with mild bibasilar atelectasis versus scarring. Overall, similar appearance of the chest compared to prior.   LEXISCAN MYOVIEW - 12/31/12  IMPRESSION:  1. No evidence of myocardial ischemia or infarction.  2. Mild septal hypokinesis. Left ventricular wall motion is otherwise normal.  3. Estimated Q G S ejection fraction 53%.  TRANSTHORACIC ECHOCARDIOGRAM - 12/31/12  Performed. Formal interpretation pending.   History of Present Illness  Ronald [name] is a 67 y.o. male who was hospitalized at [facility] with the above problem list.    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   He had a repeat cath performed in 2010 for chest pain revealing patent IMA-LAD, VG-RCA, RV-OM and CAD in small diagonal branch identified as the culprit lesion. He presented to the ED on 12/30/12 complaining of  both abdominal and atypical, sharp chest pain lesting for a few seconds at a time and not remiscent of his prior angina. No limitation to his functional capacity. He had noted some dyspnea.   In the ED, EKG revealed no evidence of ischemia. Initial trop-I WNL.CMET revealed BUN 29/Cr 1.99 (c/w baseline). CBC unremarkable. Hgb A1C 5.9%. CXR as above indicated low inspiratory volumes with mild bibasilar atelectasis vs scarring. Given his cardiac history, the decision was made to observe the patient overnight for formal rule out.   Hospital Course   Pro BNP returned WNL ruling against a cardiogenic etiology to the patient's dyspnea. Three subsequent returned WNL. He remained asymptomatic overnight. He underwent Lexiscan Myoview the following morning which indicated no evidence of ischemia, EF 53% and mild septal HK as above. 2D echo was performed. Formal interpretation pending at the time of this discharge summary. The patient demonstrated no evidence of ACS attributing to his chest/abdominal discomfort. He was deemed to be stable for discharge. He was advised to follow-up with his PCP to evaluate for GI etiologies to his chest/abdominal pain. He will follow-up in the office in 2-4 weeks. He will be called and informed of the formal echo findings. He will continue all home medications outlined below. This information has been clearly outlined in the discharge AVS.   Discharge Vitals:  Blood pressure 113/67, pulse 71, temperature 98 F (36.7 C), temperature source Oral, resp. rate 18, height 5\' 11"  (1.803 m), weight 106.55 kg (234 lb 14.4 oz), SpO2 97.00%.   Labs: Recent Labs     12/30/12  1105  12/30/12  1532  WBC  7.6  7.1  HGB  14.4  13.9  HCT  40.3  39.2  MCV  84.1  83.8  PLT  156  140*   Recent Labs Lab 12/30/12 1105 12/30/12 1532  NA 138 141  K 4.5 4.5  CL 104 106  CO2 23 23  BUN 31* 29*  CREATININE 2.17* 1.99*  CALCIUM 9.4 9.6  PROT  --  6.5  BILITOT  --  1.1  ALKPHOS  --  184*    ALT  --  21  AST  --  22  GLUCOSE 205* 100*   Recent Labs     12/30/12  1532  HGBA1C  5.9*   Recent Labs     12/30/12  2015  12/31/12  0140  12/31/12  0724  TROPONINI  <0.30  <0.30  <0.30   Disposition:       Follow-up Information   Follow up with [facility]. (Office will call you with an appointment date and time to be scheduled in 2-4 weeks.)    Contact information:   [place] [id number]       Follow up with Eulas Post, MD. Schedule an appointment as soon as possible for a visit in 1 week. (For post-hospital follow-up. )    Contact information:   [address] [place] [id number] [phone]       Discharge Medications:    Medication List         allopurinol 100 MG tablet  Commonly known as:  ZYLOPRIM  Take 100 mg by mouth at bedtime.     amLODipine 10 MG tablet  Commonly known as:  NORVASC  Take 10 mg by mouth every morning.     aspirin 81 MG tablet  Take 81 mg by mouth every morning.     atorvastatin 20 MG tablet  Commonly known as:  LIPITOR  Take 20 mg by mouth at bedtime.     carvedilol 12.5 MG tablet  Commonly known as:  COREG  Take 12.5 mg by mouth 2 (two) times daily with a meal.     hyoscyamine 0.125 MG tablet  Commonly known as:  LEVSIN, ANASPAZ  Take 0.125 mg by mouth 2 (two) times daily as needed (Irritable bowel syndrome).     magnesium oxide 400 MG tablet  Commonly known as:  MAG-OX  Take 400 mg by mouth 2 (two) times daily.     multivitamin tablet  Take 1 tablet by mouth every morning.     nitroGLYCERIN 0.4 MG SL tablet  Commonly known as:  NITROSTAT  Place 0.4 mg under the tongue every 5 (five) minutes as needed. May repeat up to three times.     omeprazole 20 MG capsule  Commonly known as:  PRILOSEC  Take 20 mg by mouth 2 (two) times daily.     sodium bicarbonate 325 MG tablet  Take 650 mg by mouth every morning.       Outstanding Labs/Studies: None  Duration  of Discharge Encounter: Greater than 30 minutes including physician time.  Signed, R. Valeria Batman, PA-C 12/31/2012, 3:24 PM  Attending Note:   The patient was seen and examined.  Agree with assessment and plan as noted above.  Changes made to the above note as needed.  myoview is normal.  He is stable for DC.   Thayer Headings, Brooke Bonito., MD, [facility] 12/31/2012, 9:47 PM

## 2013-01-04 NOTE — ED Provider Notes (Signed)
Medical screening examination/treatment/procedure(s) were conducted as a shared visit with non-physician practitioner(s) and myself.  I personally evaluated the patient during the encounter Pt with CP/SOB, cards to see  Malvin Johns, MD 01/04/13 1443

## 2013-01-24 ENCOUNTER — Encounter: Payer: Self-pay | Admitting: Physician Assistant

## 2013-01-24 ENCOUNTER — Ambulatory Visit (INDEPENDENT_AMBULATORY_CARE_PROVIDER_SITE_OTHER): Payer: Medicare Other | Admitting: Physician Assistant

## 2013-01-24 VITALS — BP 128/75 | HR 65 | Ht 70.0 in | Wt 235.0 lb

## 2013-01-24 DIAGNOSIS — E782 Mixed hyperlipidemia: Secondary | ICD-10-CM

## 2013-01-24 DIAGNOSIS — R109 Unspecified abdominal pain: Secondary | ICD-10-CM

## 2013-01-24 DIAGNOSIS — I10 Essential (primary) hypertension: Secondary | ICD-10-CM

## 2013-01-24 DIAGNOSIS — I2510 Atherosclerotic heart disease of native coronary artery without angina pectoris: Secondary | ICD-10-CM

## 2013-01-24 NOTE — Progress Notes (Signed)
[facility]. [address], [address] [place], [place]  [address] Phone: [phone] Fax:  [phone]  Date:  01/24/2013   ID:  AFRAZ HULA, DOB 1945/08/02, MRN [id number]  PCP:  Eulas Post, MD  Cardiologist:  Dr.  Bing Quarry => Dr. Lauree Chandler     History of Present Illness: Ronald [name] is a 66 y.o. male who returns for followup after recent admission to the hospital 8/2-8/3 with chest pain. He has a history of CAD, status post CABG in 1999 (LIMA-LAD, SVG-circumflex, SVG-RCA, SVG-diagonal) with redo bypass in 2007 (SVG-OM, SVG-RCA), ischemic cardiomyopathy, CKD, DM 2, HTN, HL, renal artery stenosis, COPD, GERD.  LHC 07/2008: Patent LIMA-LAD, patent SVG-diagonal/OM, patent SVG-PDA, patent SVG-PDA with 50-60% mid stenosis.    He was admitted after he presented to the hospital with chest and abdominal discomfort. EKG was unremarkable. Cardiac markers remained normal. Lexiscan Myoview 12/31/12: No ischemia or scar, EF 53%.  Echo 12/31/12: Mild LVH, EF 50-55%, normal wall motion.   Since discharge, he is doing well. No further chest pain. Abdominal pain is improving. He has an appointment with GI soon.  He has mild dyspnea with exertion. He probably describes NYHA class II symptoms. He denies orthopnea, PND or edema. He denies syncope.  Labs (8/14):  K 4.5, Cr 1.99, ALP 184, ALT 21, Hgb 13.9   Wt Readings from Last 3 Encounters:  01/24/13 235 lb (106.595 kg)  12/30/12 234 lb 14.4 oz (106.55 kg)  07/18/12 243 lb (110.224 kg)     Past Medical History  Diagnosis Date  . CAD (coronary artery disease) 2007    a. s/p bypass and subsequent redo bypass in 07 b. Lexiscan Myoview 12/31/12: no evidence of ischemia, EF 53% and mild septal HK as above   . Ischemic cardiomyopathy 2006    EF 40% to 50% by 2D echo in 2006;  Echo 12/31/12: Mild LVH, EF 50-55%, normal wall motion.   . Chronic kidney disease     Creatinine 1.9 to 2  . Hyperlipidemia   . Hypertension   . Renal artery  stenosis   . Gout   . Emphysema   . COPD (chronic obstructive pulmonary disease)   . Diabetes mellitus     Diet control   . Arthritis   . GERD (gastroesophageal reflux disease)   . Esophageal stricture 07/02/1998    EGD  . Anginal pain   . Dysrhythmia     bigeminy pvcs  . CHF (congestive heart failure)     Current Outpatient Prescriptions  Medication Sig Dispense Refill  . allopurinol (ZYLOPRIM) 100 MG tablet Take 100 mg by mouth at bedtime.       [name] amLODipine (NORVASC) 10 MG tablet Take 10 mg by mouth every morning.       [name] aspirin 81 MG tablet Take 81 mg by mouth every morning.       [name] atorvastatin (LIPITOR) 20 MG tablet Take 20 mg by mouth at bedtime.       . carvedilol (COREG) 12.5 MG tablet Take 12.5 mg by mouth 2 (two) times daily with a meal.       . magnesium oxide (MAG-OX) 400 MG tablet Take 400 mg by mouth 2 (two) times daily.        . Multiple Vitamin (MULTIVITAMIN) tablet Take 1 tablet by mouth every morning.       . nitroGLYCERIN (NITROSTAT) 0.4 MG SL tablet Place 0.4 mg under the tongue every 5 (five) minutes as needed.  May repeat up to three times.       [name] omeprazole (PRILOSEC) 20 MG capsule Take 20 mg by mouth 2 (two) times daily.      . sodium bicarbonate 325 MG tablet Take 650 mg by mouth every morning.       No current facility-administered medications for this visit.    Allergies:    Allergies  Allergen Reactions  . Hydrochlorothiazide     Dehydration  . Hydrocodone     Stomach upset  . Hydrocodone-Acetaminophen     Stomach upset  . Penicillins Hives    Social History:  The patient  reports that he quit smoking about 24 years ago. He has never used smokeless tobacco. He reports that he does not drink alcohol or use illicit drugs.   ROS:  Please see the history of present illness.     All other systems reviewed and negative.   PHYSICAL EXAM: VS:  BP 128/75  Pulse 65  Ht 5\' 10"  (1.778 m)  Wt 235 lb (106.595 kg)  BMI 33.72 kg/m2 Well nourished,  well developed, in no acute distress HEENT: normal Neck: no JVD Cardiac:  normal S1, S2; RRR; no murmur Lungs:  clear to auscultation bilaterally, no wheezing, rhonchi or rales Abd: soft, nontender, no hepatomegaly Ext: no edema Skin: warm and dry Neuro:  CNs 2-12 intact, no focal abnormalities noted  EKG:  NSR, HR 65, normal axis, PVC     ASSESSMENT AND PLAN:  1. CAD: No further chest pain. Recent Myoview low risk. Echocardiogram with stable to improved ejection fraction.  Continue current therapy with aspirin, statin, beta blocker. 2. Abdominal Pain: He has follow up with GI soon.  3. Hypertension: Controlled. 4. Hyperlipidemia: Continue statin. 5. CKD:  Creatinine stable.  He is not on an ACE inhibitor.   6. Disposition:  F/u with Dr. Lauree Chandler in 3 mos.  He was to start seeing Dr. Ida Rogue in [place] [employer] but prefers to stay in [place].     Signed, Richardson Dopp, PA-C  01/24/2013 12:17 PM

## 2013-01-24 NOTE — Patient Instructions (Addendum)
NO CHANGES WERE MADE TODAY  PLEASE FOLLOW UP WITH DR. Angelena Form IN 3 MONTHS; PREVIOUS DR. Lia Foyer PT

## 2013-04-18 ENCOUNTER — Encounter: Payer: Self-pay | Admitting: Cardiovascular Disease

## 2013-04-18 ENCOUNTER — Ambulatory Visit (INDEPENDENT_AMBULATORY_CARE_PROVIDER_SITE_OTHER): Payer: Medicare Other | Admitting: Cardiovascular Disease

## 2013-04-18 ENCOUNTER — Encounter: Payer: Self-pay | Admitting: *Deleted

## 2013-04-18 VITALS — BP 142/82 | HR 72 | Ht 71.0 in | Wt 239.0 lb

## 2013-04-18 DIAGNOSIS — N183 Chronic kidney disease, stage 3 unspecified: Secondary | ICD-10-CM

## 2013-04-18 DIAGNOSIS — I255 Ischemic cardiomyopathy: Secondary | ICD-10-CM

## 2013-04-18 DIAGNOSIS — I2510 Atherosclerotic heart disease of native coronary artery without angina pectoris: Secondary | ICD-10-CM

## 2013-04-18 DIAGNOSIS — I2589 Other forms of chronic ischemic heart disease: Secondary | ICD-10-CM

## 2013-04-18 DIAGNOSIS — E782 Mixed hyperlipidemia: Secondary | ICD-10-CM

## 2013-04-18 DIAGNOSIS — I10 Essential (primary) hypertension: Secondary | ICD-10-CM

## 2013-04-18 NOTE — Patient Instructions (Signed)
Your physician wants you to follow-up in:  6 months. You will receive a reminder letter in the mail two months in advance. If you don't receive a letter, please call our office to schedule the follow-up appointment.   

## 2013-04-18 NOTE — Progress Notes (Signed)
History of Present Illness: 67 y.o. male with history of CAD s/p CABG in 1996 (LIMA-LAD, SVG-circumflex, SVG-RCA, SVG-diagonal) with redo bypass in 2007 (SVG-OM, SVG-RCA), ischemic cardiomyopathy, CKD, DM 2, HTN, HL, renal artery stenosis, COPD, GERD here today for cardiac follow up. He has been followed by Dr. Lia Foyer. LHC 07/2008: Patent LIMA-LAD, patent SVG-diagonal/OM, patent SVG-PDA, patent SVG-PDA with 50-60% mid stenosis.  He was admitted to [facility] August 2014 with chest and abdominal discomfort. EKG was unremarkable. Cardiac markers remained normal. Lexiscan Myoview 12/31/12: No ischemia or scar, EF 53%. Echo 12/31/12: Mild LVH, EF 50-55%, normal wall motion.   He is here today for follow up. He is doing well. No chest pain. Abdominal pain is present but improving. He has mild dyspnea with exertion. He denies orthopnea, PND or edema.  Primary Care Physician: Miguel Aschoff  Last Lipid Profile: Followed in primary care.   Past Medical History  Diagnosis Date  . CAD (coronary artery disease) 2007    a. s/p bypass and subsequent redo bypass in 07 b. Lexiscan Myoview 12/31/12: no evidence of ischemia, EF 53% and mild septal HK as above   . Ischemic cardiomyopathy 2006    EF 40% to 50% by 2D echo in 2006;  Echo 12/31/12: Mild LVH, EF 50-55%, normal wall motion.   . Chronic kidney disease     Creatinine 1.9 to 2  . Hyperlipidemia   . Hypertension   . Renal artery stenosis   . Gout   . Emphysema   . COPD (chronic obstructive pulmonary disease)   . Diabetes mellitus     Diet control   . Arthritis   . GERD (gastroesophageal reflux disease)   . Esophageal stricture 07/02/1998    EGD  . Anginal pain   . Dysrhythmia     bigeminy pvcs  . CHF (congestive heart failure)     Past Surgical History  Procedure Laterality Date  . Coronary artery bypass graft  1996  . Coronary artery bypass graft  March 2007    Redo of CABG from 1996  . Lung repair  1996  . Cholecystectomy      Current  Outpatient Prescriptions  Medication Sig Dispense Refill  . allopurinol (ZYLOPRIM) 100 MG tablet Take 100 mg by mouth at bedtime.       [name] amLODipine (NORVASC) 10 MG tablet Take 10 mg by mouth every morning.       [name] aspirin 81 MG tablet Take 81 mg by mouth every morning.       [name] atorvastatin (LIPITOR) 20 MG tablet Take 20 mg by mouth at bedtime.       . carvedilol (COREG) 12.5 MG tablet Take 12.5 mg by mouth 2 (two) times daily with a meal.       . magnesium oxide (MAG-OX) 400 MG tablet Take 400 mg by mouth 2 (two) times daily.        . Multiple Vitamin (MULTIVITAMIN) tablet Take 1 tablet by mouth every morning.       . nitroGLYCERIN (NITROSTAT) 0.4 MG SL tablet Place 0.4 mg under the tongue every 5 (five) minutes as needed. May repeat up to three times.       [name] omeprazole (PRILOSEC) 20 MG capsule Take 20 mg by mouth 2 (two) times daily.      . sodium bicarbonate 325 MG tablet Take 650 mg by mouth every morning.       No current facility-administered medications for this visit.  Allergies  Allergen Reactions  . Hydrochlorothiazide     Dehydration  . Hydrocodone     Stomach upset  . Hydrocodone-Acetaminophen     Stomach upset  . Penicillins Hives    History   Social History  . Marital Status: Married    Spouse Name: N/A    Number of Children: 2  . Years of Education: N/A   Occupational History  . Retired    Social History Main Topics  . Smoking status: Former Smoker -- 3.00 packs/day for 20 years    Quit date: 05/31/1988  . Smokeless tobacco: Never Used  . Alcohol Use: No  . Drug Use: No  . Sexual Activity: No   Other Topics Concern  . 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    Family History  Problem Relation Age of Onset  . Heart attack Mother     MI  . Stroke Mother   . Heart disease Father   . Rheumatic fever Father     Review of Systems:  As stated in the HPI and otherwise negative.   BP 142/82  Pulse 72  Ht 5\' 11"  (1.803  m)  Wt 239 lb (108.41 kg)  BMI 33.35 kg/m2  Physical Examination: General: Well developed, well nourished, NAD HEENT: OP clear, mucus membranes moist SKIN: warm, dry. No rashes. Neuro: No focal deficits Musculoskeletal: Muscle strength 5/5 all ext Psychiatric: Mood and affect normal Neck: No JVD, no carotid bruits, no thyromegaly, no lymphadenopathy. Lungs:Clear bilaterally, no wheezes, rhonci, crackles Cardiovascular: Regular rate and rhythm. No murmurs, gallops or rubs. Abdomen:Soft. Bowel sounds present. Non-tender.  Extremities: No lower extremity edema. Pulses are 2 + in the bilateral DP/PT.  Assessment and Plan:   1. CAD: No further chest pain. Recent Myoview low risk. Echocardiogram with stable to improved ejection fraction. Continue current therapy with aspirin, statin, beta blocker.   2. Ischemic Cardiomyopathy: Last LVEF was 53% by nuclear study August 2014. Continue medical management.   3. Hypertension: Controlled. No changes today.   4. Hyperlipidemia: Continue statin. Lipids followed in primary care.   5. CKD: He is not on an ACE inhibitor. Baseline creatinine around 2.0.

## 2013-07-09 ENCOUNTER — Telehealth: Payer: Self-pay | Admitting: Cardiovascular Disease

## 2013-07-09 NOTE — Telephone Encounter (Signed)
Agree. cdm 

## 2013-07-09 NOTE — Telephone Encounter (Signed)
New Problem:  Pt's wife is c/o her husband having chest burning/tightness for the past week. She would like her husband to be seen today. Mrs [facility] would like a call back.

## 2013-07-09 NOTE — Telephone Encounter (Signed)
Patient saw his PCP last Thursday for respiratory and discussed the burning in his chest with him, was told that this goes along with infection he had per wife. He has been on antibiotic since then and still having burning and productive cough. Advised wife to have start with PCP and if they felt cardiac related to call back and will get him an appointment this week. Will forward to [facility] A RN and Dr Angelena Form

## 2013-09-13 LAB — PSA: PSA: 0.8

## 2013-10-17 ENCOUNTER — Ambulatory Visit (INDEPENDENT_AMBULATORY_CARE_PROVIDER_SITE_OTHER): Payer: Medicare Other | Admitting: Cardiovascular Disease

## 2013-10-17 ENCOUNTER — Encounter: Payer: Self-pay | Admitting: Cardiovascular Disease

## 2013-10-17 VITALS — BP 110/60 | HR 64 | Ht 70.0 in | Wt 235.0 lb

## 2013-10-17 DIAGNOSIS — I2510 Atherosclerotic heart disease of native coronary artery without angina pectoris: Secondary | ICD-10-CM

## 2013-10-17 DIAGNOSIS — N183 Chronic kidney disease, stage 3 unspecified: Secondary | ICD-10-CM

## 2013-10-17 DIAGNOSIS — I2589 Other forms of chronic ischemic heart disease: Secondary | ICD-10-CM

## 2013-10-17 DIAGNOSIS — I255 Ischemic cardiomyopathy: Secondary | ICD-10-CM

## 2013-10-17 DIAGNOSIS — E782 Mixed hyperlipidemia: Secondary | ICD-10-CM

## 2013-10-17 DIAGNOSIS — I10 Essential (primary) hypertension: Secondary | ICD-10-CM

## 2013-10-17 NOTE — Progress Notes (Signed)
History of Present Illness: 68 y.o. male with history of CAD s/p CABG in 1996 (LIMA-LAD, SVG-circumflex, SVG-RCA, SVG-diagonal) with redo bypass in 2007 (SVG-OM, SVG-RCA), ischemic cardiomyopathy, CKD, DM 2, HTN, HLD, renal artery stenosis, COPD, GERD here today for cardiac follow up. He has been followed by Dr. Lia Foyer. LHC 07/2008: Patent LIMA-LAD, patent SVG-diagonal/OM, patent SVG-PDA, patent SVG-PDA with 50-60% mid stenosis.  He was admitted to [facility] August 2014 with chest and abdominal discomfort. EKG was unremarkable. Cardiac markers remained normal. Lexiscan Myoview 12/31/12: No ischemia or scar, EF 53%. Echo 12/31/12: Mild LVH, EF 50-55%, normal wall motion.   He is here today for follow up. He is doing well. No chest pain. He has mild dyspnea with exertion. He denies orthopnea, PND or edema.  Primary Care Physician: Miguel Aschoff  Last Lipid Profile: Followed in primary care.   Past Medical History  Diagnosis Date  . CAD (coronary artery disease) 2007    a. s/p bypass and subsequent redo bypass in 07 b. Lexiscan Myoview 12/31/12: no evidence of ischemia, EF 53% and mild septal HK as above   . Ischemic cardiomyopathy 2006    EF 40% to 50% by 2D echo in 2006;  Echo 12/31/12: Mild LVH, EF 50-55%, normal wall motion.   . Chronic kidney disease     Creatinine 1.9 to 2  . Hyperlipidemia   . Hypertension   . Renal artery stenosis   . Gout   . Emphysema   . COPD (chronic obstructive pulmonary disease)   . Diabetes mellitus     Diet control   . Arthritis   . GERD (gastroesophageal reflux disease)   . Esophageal stricture 07/02/1998    EGD  . Anginal pain   . Dysrhythmia     bigeminy pvcs  . CHF (congestive heart failure)     Past Surgical History  Procedure Laterality Date  . Coronary artery bypass graft  1996  . Coronary artery bypass graft  March 2007    Redo of CABG from 1996  . Lung repair  1996  . Cholecystectomy      Current Outpatient Prescriptions  Medication Sig  Dispense Refill  . allopurinol (ZYLOPRIM) 100 MG tablet Take 100 mg by mouth at bedtime.       [name] amLODipine (NORVASC) 10 MG tablet Take 10 mg by mouth every morning.       [name] aspirin 81 MG tablet Take 81 mg by mouth every morning.       [name] atorvastatin (LIPITOR) 20 MG tablet Take 20 mg by mouth at bedtime.       . carvedilol (COREG) 12.5 MG tablet Take 12.5 mg by mouth 2 (two) times daily with a meal.       . enalapril (VASOTEC) 2.5 MG tablet daily.       . magnesium oxide (MAG-OX) 400 MG tablet Take 400 mg by mouth 2 (two) times daily.        . Multiple Vitamin (MULTIVITAMIN) tablet Take 1 tablet by mouth every morning.       . nitroGLYCERIN (NITROSTAT) 0.4 MG SL tablet Place 0.4 mg under the tongue every 5 (five) minutes as needed. May repeat up to three times.       [name] omeprazole (PRILOSEC) 20 MG capsule Take 20 mg by mouth 2 (two) times daily.      . sodium bicarbonate 325 MG tablet Take 650 mg by mouth every morning.       No current facility-administered medications  for this visit.    Allergies  Allergen Reactions  . Hydrochlorothiazide     Dehydration  . Hydrocodone     Stomach upset  . Hydrocodone-Acetaminophen     Stomach upset  . Penicillins Hives    History   Social History  . Marital Status: Married    Spouse Name: N/A    Number of Children: 2  . Years of Education: N/A   Occupational History  . Retired    Social History Main Topics  . Smoking status: Former Smoker -- 3.00 packs/day for 20 years    Quit date: 05/31/1988  . Smokeless tobacco: Never Used  . Alcohol Use: No  . Drug Use: No  . Sexual Activity: No   Other Topics Concern  . 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    Family History  Problem Relation Age of Onset  . Heart attack Mother     MI  . Stroke Mother   . Heart disease Father   . Rheumatic fever Father     Review of Systems:  As stated in the HPI and otherwise negative.   BP 110/60  Pulse 64  Ht 5\' 10"   (1.778 m)  Wt 235 lb (106.595 kg)  BMI 33.72 kg/m2  Physical Examination: General: Well developed, well nourished, NAD HEENT: OP clear, mucus membranes moist SKIN: warm, dry. No rashes. Neuro: No focal deficits Musculoskeletal: Muscle strength 5/5 all ext Psychiatric: Mood and affect normal Neck: No JVD, no carotid bruits, no thyromegaly, no lymphadenopathy. Lungs:Clear bilaterally, no wheezes, rhonci, crackles Cardiovascular: Regular rate and rhythm. No murmurs, gallops or rubs. Abdomen:Soft. Bowel sounds present. Non-tender.  Extremities: No lower extremity edema. Pulses are 2 + in the bilateral DP/PT.  EKG: NSR, rate 64 bpm.   Assessment and Plan:   1. CAD: Stable.  Recent stress myoview 12/31/12 without ischemia. Echocardiogram with stable to improved ejection fraction. Continue current therapy with aspirin, statin, beta blocker.   2. Ischemic Cardiomyopathy: Last LVEF was 53% by nuclear study August 2014. Continue medical management.   3. Hypertension: Controlled. No changes today. He thinks 110/60 is too low for him. Consider lowering Norvasc to 5 mg per day.   4. Hyperlipidemia: Continue statin. Lipids followed in primary care.   5. CKD: He is on a low dose ACE inhibitor per Nephrology.  Baseline creatinine around 2.0.

## 2013-10-17 NOTE — Patient Instructions (Signed)
Your physician wants you to follow-up in:  6 months. You will receive a reminder letter in the mail two months in advance. If you don't receive a letter, please call our office to schedule the follow-up appointment.   

## 2013-10-18 LAB — TSH: TSH: 2.09 u[IU]/mL (ref <=5.90)

## 2014-01-14 LAB — LIPID PANEL
CHOLESTEROL: 197 mg/dL (ref 0–200)
HDL: 35 mg/dL (ref 35–70)
LDL Cholesterol: 143 mg/dL
LDL/HDL RATIO: 4.1
Triglycerides: 95 mg/dL (ref 40–160)

## 2014-03-06 LAB — CBC AND DIFFERENTIAL
HCT: 40 % — AB (ref 41–53)
Hemoglobin: 13.8 g/dL (ref 13.5–17.5)
NEUTROS ABS: 75 /uL
Platelets: 170 10*3/uL (ref 150–399)
WBC: 9.9 10^3/mL

## 2014-03-06 LAB — HEPATIC FUNCTION PANEL
ALT: 23 U/L (ref 10–40)
AST: 22 U/L (ref 14–40)
Alkaline Phosphatase: 190 U/L — AB (ref 25–125)
BILIRUBIN, TOTAL: 0.6 mg/dL

## 2014-03-06 LAB — BASIC METABOLIC PANEL
BUN: 28 mg/dL — AB (ref 4–21)
CREATININE: 2 mg/dL — AB (ref <=1.3)
Glucose: 141 mg/dL
Potassium: 5 mmol/L (ref 3.4–5.3)
Sodium: 143 mmol/L (ref 137–147)

## 2014-03-19 ENCOUNTER — Ambulatory Visit: Payer: Self-pay | Admitting: Family Medicine

## 2014-04-18 ENCOUNTER — Encounter: Payer: Self-pay | Admitting: Cardiovascular Disease

## 2014-04-18 ENCOUNTER — Ambulatory Visit (INDEPENDENT_AMBULATORY_CARE_PROVIDER_SITE_OTHER): Payer: Medicare Other | Admitting: Cardiovascular Disease

## 2014-04-18 VITALS — BP 122/70 | HR 69 | Ht 70.0 in | Wt 233.8 lb

## 2014-04-18 DIAGNOSIS — E782 Mixed hyperlipidemia: Secondary | ICD-10-CM

## 2014-04-18 DIAGNOSIS — I2510 Atherosclerotic heart disease of native coronary artery without angina pectoris: Secondary | ICD-10-CM

## 2014-04-18 DIAGNOSIS — I10 Essential (primary) hypertension: Secondary | ICD-10-CM

## 2014-04-18 DIAGNOSIS — I255 Ischemic cardiomyopathy: Secondary | ICD-10-CM

## 2014-04-18 NOTE — Progress Notes (Signed)
History of Present Illness: 68 y.o. male with history of CAD s/p CABG in 1996 (LIMA-LAD, SVG-circumflex, SVG-RCA, SVG-diagonal) with redo bypass in 2007 (SVG-OM, SVG-RCA), ischemic cardiomyopathy, CKD, DM 2, HTN, HLD, renal artery stenosis, COPD, GERD here today for cardiac follow up. He has been followed by Dr. Lia Foyer. LHC 07/2008: Patent LIMA-LAD, patent SVG-diagonal/OM, patent SVG-PDA, patent SVG-PDA with 50-60% mid stenosis.  He was admitted to [facility] August 2014 with chest and abdominal discomfort. EKG was unremarkable. Cardiac markers remained normal. Lexiscan Myoview 12/31/12: No ischemia or scar, EF 53%. Echo 12/31/12: Mild LVH, EF 50-55%, normal wall motion.   He is here today for follow up. He is doing well. No chest pain or SOB. He has mild dyspnea with exertion. He denies orthopnea, PND or edema.  Primary Care Physician: Miguel Aschoff  Last Lipid Profile: Followed in primary care.   Past Medical History  Diagnosis Date  . CAD (coronary artery disease) 2007    a. s/p bypass and subsequent redo bypass in 07 b. Lexiscan Myoview 12/31/12: no evidence of ischemia, EF 53% and mild septal HK as above   . Ischemic cardiomyopathy 2006    EF 40% to 50% by 2D echo in 2006;  Echo 12/31/12: Mild LVH, EF 50-55%, normal wall motion.   . Chronic kidney disease     Creatinine 1.9 to 2  . Hyperlipidemia   . Hypertension   . Renal artery stenosis   . Gout   . Emphysema   . COPD (chronic obstructive pulmonary disease)   . Diabetes mellitus     Diet control   . Arthritis   . GERD (gastroesophageal reflux disease)   . Esophageal stricture 07/02/1998    EGD  . Anginal pain   . Dysrhythmia     bigeminy pvcs  . CHF (congestive heart failure)     Past Surgical History  Procedure Laterality Date  . Coronary artery bypass graft  1996  . Coronary artery bypass graft  March 2007    Redo of CABG from 1996  . Lung repair  1996  . Cholecystectomy      Current Outpatient Prescriptions  Medication  Sig Dispense Refill  . allopurinol (ZYLOPRIM) 100 MG tablet Take 100 mg by mouth at bedtime.     [name] amLODipine (NORVASC) 5 MG tablet Take 5 mg by mouth daily.    [name] aspirin 81 MG tablet Take 81 mg by mouth every morning.     [name] atorvastatin (LIPITOR) 10 MG tablet Take 10 mg by mouth daily.    . carvedilol (COREG) 12.5 MG tablet Take 12.5 mg by mouth 2 (two) times daily with a meal.     . enalapril (VASOTEC) 2.5 MG tablet daily.     . magnesium oxide (MAG-OX) 400 MG tablet Take 400 mg by mouth 2 (two) times daily.      . Multiple Vitamin (MULTIVITAMIN) tablet Take 1 tablet by mouth every morning.     . nitroGLYCERIN (NITROSTAT) 0.4 MG SL tablet Place 0.4 mg under the tongue every 5 (five) minutes as needed. May repeat up to three times.     [name] omeprazole (PRILOSEC) 20 MG capsule Take 20 mg by mouth 2 (two) times daily.    . sodium bicarbonate 325 MG tablet Take 650 mg by mouth every morning.     No current facility-administered medications for this visit.    Allergies  Allergen Reactions  . Ciprofloxacin     GI upset  . Hydrochlorothiazide Other (See  Comments)    dehydrates Dehydration  . Hydrocodone     Stomach upset  . Hydrocodone-Acetaminophen     Stomach upset  . Hydrocodone-Acetaminophen Nausea Only  . Penicillins Hives and Rash    History   Social History  . Marital Status: Married    Spouse Name: N/A    Number of Children: 2  . Years of Education: N/A   Occupational History  . Retired    Social History Main Topics  . Smoking status: Former Smoker -- 3.00 packs/day for 20 years    Quit date: 05/31/1988  . Smokeless tobacco: Never Used  . Alcohol Use: No  . Drug Use: No  . Sexual Activity: No   Other Topics Concern  . 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    Family History  Problem Relation Age of Onset  . Heart attack Mother     MI  . Stroke Mother   . Heart disease Father   . Rheumatic fever Father     Review of Systems:   As stated in the HPI and otherwise negative.   BP 122/70 mmHg  Pulse 69  Ht 5\' 10"  (1.778 m)  Wt 233 lb 12.8 oz (106.051 kg)  BMI 33.55 kg/m2  SpO2 97%  Physical Examination: General: Well developed, well nourished, NAD HEENT: OP clear, mucus membranes moist SKIN: warm, dry. No rashes. Neuro: No focal deficits Musculoskeletal: Muscle strength 5/5 all ext Psychiatric: Mood and affect normal Neck: No JVD, no carotid bruits, no thyromegaly, no lymphadenopathy. Lungs:Clear bilaterally, no wheezes, rhonci, crackles Cardiovascular: Regular rate and rhythm. No murmurs, gallops or rubs. Abdomen:Soft. Bowel sounds present. Non-tender.  Extremities: No lower extremity edema. Pulses are 2 + in the bilateral DP/P  Assessment and Plan:   1. CAD: He is having no angina. Stress myoview 12/31/12 without ischemia. Echocardiogram 12/31/12 with stable to improved ejection fraction. Continue current therapy with aspirin, statin, beta blocker.   2. Ischemic Cardiomyopathy: Last LVEF was 53% by nuclear study August 2014. Continue medical management.   3. Hypertension: Controlled. No changes today.    4. Hyperlipidemia: Continue statin. Lipids are followed in primary care.   5. CKD: He is on a low dose ACE inhibitor per Nephrology.  Baseline creatinine around 2.0.

## 2014-04-18 NOTE — Patient Instructions (Signed)
Your physician wants you to follow-up in:  6 months. You will receive a reminder letter in the mail two months in advance. If you don't receive a letter, please call our office to schedule the follow-up appointment.   

## 2014-05-27 LAB — HEMOGLOBIN A1C: Hgb A1c MFr Bld: 5.6 % (ref 4.0–6.0)

## 2014-07-22 DIAGNOSIS — N183 Chronic kidney disease, stage 3 (moderate): Secondary | ICD-10-CM | POA: Diagnosis not present

## 2014-07-22 DIAGNOSIS — N179 Acute kidney failure, unspecified: Secondary | ICD-10-CM | POA: Diagnosis not present

## 2014-07-22 DIAGNOSIS — I701 Atherosclerosis of renal artery: Secondary | ICD-10-CM | POA: Diagnosis not present

## 2014-07-29 DIAGNOSIS — N183 Chronic kidney disease, stage 3 (moderate): Secondary | ICD-10-CM | POA: Diagnosis not present

## 2014-07-29 DIAGNOSIS — E872 Acidosis: Secondary | ICD-10-CM | POA: Diagnosis not present

## 2014-07-29 DIAGNOSIS — I10 Essential (primary) hypertension: Secondary | ICD-10-CM | POA: Diagnosis not present

## 2014-09-19 ENCOUNTER — Encounter: Payer: Self-pay | Admitting: Gastroenterology

## 2014-10-04 DIAGNOSIS — E119 Type 2 diabetes mellitus without complications: Secondary | ICD-10-CM

## 2014-10-04 DIAGNOSIS — K219 Gastro-esophageal reflux disease without esophagitis: Secondary | ICD-10-CM | POA: Insufficient documentation

## 2014-10-04 DIAGNOSIS — I712 Thoracic aortic aneurysm, without rupture, unspecified: Secondary | ICD-10-CM | POA: Insufficient documentation

## 2014-10-04 DIAGNOSIS — E785 Hyperlipidemia, unspecified: Secondary | ICD-10-CM | POA: Insufficient documentation

## 2014-10-04 DIAGNOSIS — I2510 Atherosclerotic heart disease of native coronary artery without angina pectoris: Secondary | ICD-10-CM | POA: Insufficient documentation

## 2014-10-04 DIAGNOSIS — L821 Other seborrheic keratosis: Secondary | ICD-10-CM | POA: Insufficient documentation

## 2014-10-04 DIAGNOSIS — I719 Aortic aneurysm of unspecified site, without rupture: Secondary | ICD-10-CM | POA: Insufficient documentation

## 2014-10-04 DIAGNOSIS — I10 Essential (primary) hypertension: Secondary | ICD-10-CM | POA: Insufficient documentation

## 2014-10-04 DIAGNOSIS — M1990 Unspecified osteoarthritis, unspecified site: Secondary | ICD-10-CM | POA: Insufficient documentation

## 2014-10-04 DIAGNOSIS — M109 Gout, unspecified: Secondary | ICD-10-CM | POA: Insufficient documentation

## 2014-10-04 DIAGNOSIS — N189 Chronic kidney disease, unspecified: Secondary | ICD-10-CM | POA: Insufficient documentation

## 2014-10-04 DIAGNOSIS — D649 Anemia, unspecified: Secondary | ICD-10-CM | POA: Insufficient documentation

## 2014-10-04 DIAGNOSIS — N400 Enlarged prostate without lower urinary tract symptoms: Secondary | ICD-10-CM | POA: Insufficient documentation

## 2014-10-04 DIAGNOSIS — IMO0002 Reserved for concepts with insufficient information to code with codable children: Secondary | ICD-10-CM | POA: Insufficient documentation

## 2014-10-04 HISTORY — DX: Type 2 diabetes mellitus without complications: E11.9

## 2014-10-22 ENCOUNTER — Ambulatory Visit (INDEPENDENT_AMBULATORY_CARE_PROVIDER_SITE_OTHER): Payer: Medicare Other | Admitting: Cardiovascular Disease

## 2014-10-22 ENCOUNTER — Encounter: Payer: Self-pay | Admitting: Cardiovascular Disease

## 2014-10-22 VITALS — BP 132/60 | HR 62 | Ht 70.0 in | Wt 231.1 lb

## 2014-10-22 DIAGNOSIS — I10 Essential (primary) hypertension: Secondary | ICD-10-CM

## 2014-10-22 DIAGNOSIS — E782 Mixed hyperlipidemia: Secondary | ICD-10-CM | POA: Diagnosis not present

## 2014-10-22 DIAGNOSIS — I2510 Atherosclerotic heart disease of native coronary artery without angina pectoris: Secondary | ICD-10-CM

## 2014-10-22 DIAGNOSIS — I255 Ischemic cardiomyopathy: Secondary | ICD-10-CM | POA: Diagnosis not present

## 2014-10-22 MED ORDER — NITROGLYCERIN 0.4 MG SL SUBL: 0.4000 mg | SUBLINGUAL_TABLET | SUBLINGUAL | Status: DC | PRN

## 2014-10-22 NOTE — Patient Instructions (Signed)
Medication Instructions:  Your physician recommends that you continue on your current medications as directed. Please refer to the Current Medication list given to you today.   Labwork: none  Testing/Procedures: none  Follow-Up: Your physician wants you to follow-up in: 6 months.  You will receive a reminder letter in the mail two months in advance. If you don't receive a letter, please call our office to schedule the follow-up appointment.       

## 2014-10-22 NOTE — Progress Notes (Signed)
Chief Complaint  Patient presents with  . Follow-up    History of Present Illness: 69 y.o. male with history of CAD s/p CABG in 1996 (LIMA-LAD, SVG-circumflex, SVG-RCA, SVG-diagonal) with redo bypass in 2007 (SVG-OM, SVG-RCA), ischemic cardiomyopathy, CKD, DM 2, HTN, HLD, renal artery stenosis, COPD, GERD here today for cardiac follow up. He has been followed by Dr. Lia Foyer. Last cardiac cath 07/2008: Patent LIMA-LAD, patent SVG-diagonal/OM, patent SVG-PDA, patent SVG-PDA with 50-60% mid stenosis.  He was admitted to [facility] August 2014 with chest and abdominal discomfort. EKG was unremarkable. Cardiac markers remained normal. Lexiscan Myoview 12/31/12: No ischemia or scar, EF 53%. Echo 12/31/12: Mild LVH, EF 50-55%, normal wall motion.   He is here today for follow up. He is doing well. No chest pain or SOB. He denies orthopnea, PND or edema.  Primary Care Physician: Miguel Aschoff  Last Lipid Profile: Followed in primary care.   Past Medical History  Diagnosis Date  . CAD (coronary artery disease) 2007    a. s/p bypass and subsequent redo bypass in 07 b. Lexiscan Myoview 12/31/12: no evidence of ischemia, EF 53% and mild septal HK as above   . Ischemic cardiomyopathy 2006    EF 40% to 50% by 2D echo in 2006;  Echo 12/31/12: Mild LVH, EF 50-55%, normal wall motion.   . Chronic kidney disease     Creatinine 1.9 to 2  . Hyperlipidemia   . Hypertension   . Renal artery stenosis   . Gout   . Emphysema   . COPD (chronic obstructive pulmonary disease)   . Diabetes mellitus     Diet control   . Arthritis   . GERD (gastroesophageal reflux disease)   . Esophageal stricture 07/02/1998    EGD  . Anginal pain   . Dysrhythmia     bigeminy pvcs  . CHF (congestive heart failure)     Past Surgical History  Procedure Laterality Date  . Coronary artery bypass graft  1996  . Coronary artery bypass graft  March 2007    Redo of CABG from 1996  . Lung repair  1996  . Cholecystectomy    . Angioplasty       Current Outpatient Prescriptions  Medication Sig Dispense Refill  . allopurinol (ZYLOPRIM) 100 MG tablet Take 100 mg by mouth at bedtime.     [name] amLODipine (NORVASC) 5 MG tablet Take 5 mg by mouth daily.    [name] aspirin 81 MG tablet Take 81 mg by mouth every morning.     [name] atorvastatin (LIPITOR) 10 MG tablet Take 10 mg by mouth daily.    . carvedilol (COREG) 12.5 MG tablet Take 12.5 mg by mouth 2 (two) times daily with a meal.     . enalapril (VASOTEC) 2.5 MG tablet daily.     . magnesium oxide (MAG-OX) 400 MG tablet Take 400 mg by mouth 2 (two) times daily.      . Multiple Vitamin (MULTIVITAMIN) tablet Take 1 tablet by mouth every morning.     . nitroGLYCERIN (NITROSTAT) 0.4 MG SL tablet Place 1 tablet (0.4 mg total) under the tongue every 5 (five) minutes as needed. May repeat up to three times. 25 tablet 6  . omeprazole (PRILOSEC) 20 MG capsule Take 20 mg by mouth 2 (two) times daily.    . sodium bicarbonate 325 MG tablet Take 325 mg by mouth 2 (two) times daily.      No current facility-administered medications for this visit.  Allergies  Allergen Reactions  . Ciprofloxacin     GI upset  . Hydrochlorothiazide Other (See Comments)    dehydrates Dehydration  . Hydrocodone     Stomach upset  . Hydrocodone-Acetaminophen     Stomach upset  . Hydrocodone-Acetaminophen Nausea Only  . Sulfa Antibiotics   . Penicillins Hives and Rash    History   Social History  . Marital Status: Married    Spouse Name: N/A  . Number of Children: 2  . Years of Education: N/A   Occupational History  . Retired    Social History Main Topics  . Smoking status: Former Smoker -- 3.00 packs/day for 20 years    Quit date: 05/31/1988  . Smokeless tobacco: Never Used  . Alcohol Use: No  . Drug Use: No  . Sexual Activity: No   Other Topics Concern  . 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    Family History  Problem Relation Age of Onset  . Heart attack  Mother     MI  . Stroke Mother   . Heart disease Mother   . Hypertension Mother   . Hyperlipidemia Mother   . Heart disease Father   . Rheumatic fever Father     Review of Systems:  As stated in the HPI and otherwise negative.   BP 132/60 mmHg  Pulse 62  Ht 5\' 10"  (1.778 m)  Wt 231 lb 1.9 oz (104.835 kg)  BMI 33.16 kg/m2  Physical Examination: General: Well developed, well nourished, NAD HEENT: OP clear, mucus membranes moist SKIN: warm, dry. No rashes. Neuro: No focal deficits Musculoskeletal: Muscle strength 5/5 all ext Psychiatric: Mood and affect normal Neck: No JVD, no carotid bruits, no thyromegaly, no lymphadenopathy. Lungs:Clear bilaterally, no wheezes, rhonci, crackles Cardiovascular: Regular rate and rhythm. No murmurs, gallops or rubs. Abdomen:Soft. Bowel sounds present. Non-tender.  Extremities: No lower extremity edema. Pulses are 2 + in the bilateral DP/PT  EKG:  EKG is ordered today. The ekg ordered today demonstrates Sinus, rate 62 bpm. PVC  Recent Labs: 03/06/2014: ALT 23; BUN 28*; Creatinine 2.0*; Hemoglobin 13.8; Platelets 170; Potassium 5.0; Sodium 143   Lipid Panel    Component Value Date/Time   CHOL 197 01/14/2014   TRIG 95 01/14/2014   HDL 35 01/14/2014   LDLCALC 143 01/14/2014     Wt Readings from Last 3 Encounters:  10/22/14 231 lb 1.9 oz (104.835 kg)  05/27/14 235 lb (106.595 kg)  04/18/14 233 lb 12.8 oz (106.051 kg)     Other studies Reviewed: Additional studies/ records that were reviewed today include: . Review of the above records demonstrates:    Assessment and Plan:   1. CAD: He is having no angina. Stress myoview 12/31/12 without ischemia. Echocardiogram 12/31/12 with stable to improved ejection fraction. Continue current therapy with aspirin, statin, beta blocker.   2. Ischemic Cardiomyopathy: Last LVEF was 53% by nuclear study August 2014. Continue medical management.   3. Hypertension: Controlled. No changes today.    4.  Hyperlipidemia: Continue statin. Lipids are followed in primary care.   5. CKD: He is on a low dose ACE inhibitor per Nephrology.  Baseline creatinine around 2.0.   Current medicines are reviewed at length with the patient today.  The patient does not have concerns regarding medicines.  The following changes have been made:  no change  Labs/ tests ordered today include:   Orders Placed This Encounter  Procedures  . EKG  12-Lead    Disposition:   FU with me in 6 months  Signed, Lauree Chandler, MD 10/22/2014 4:50 PM    [facility] [place], [place], [place]  [address] Phone: [phone]; Fax: [phone]

## 2014-10-23 DIAGNOSIS — M1990 Unspecified osteoarthritis, unspecified site: Secondary | ICD-10-CM | POA: Diagnosis not present

## 2014-10-23 DIAGNOSIS — R339 Retention of urine, unspecified: Secondary | ICD-10-CM | POA: Diagnosis not present

## 2014-10-23 DIAGNOSIS — N3941 Urge incontinence: Secondary | ICD-10-CM | POA: Diagnosis not present

## 2014-10-23 DIAGNOSIS — N401 Enlarged prostate with lower urinary tract symptoms: Secondary | ICD-10-CM | POA: Diagnosis not present

## 2014-10-23 DIAGNOSIS — I701 Atherosclerosis of renal artery: Secondary | ICD-10-CM | POA: Diagnosis not present

## 2014-10-23 DIAGNOSIS — I129 Hypertensive chronic kidney disease with stage 1 through stage 4 chronic kidney disease, or unspecified chronic kidney disease: Secondary | ICD-10-CM | POA: Diagnosis not present

## 2014-10-23 DIAGNOSIS — K219 Gastro-esophageal reflux disease without esophagitis: Secondary | ICD-10-CM | POA: Diagnosis not present

## 2014-10-23 DIAGNOSIS — Z7982 Long term (current) use of aspirin: Secondary | ICD-10-CM | POA: Diagnosis not present

## 2014-10-23 DIAGNOSIS — Z951 Presence of aortocoronary bypass graft: Secondary | ICD-10-CM | POA: Diagnosis not present

## 2014-10-23 DIAGNOSIS — Z79899 Other long term (current) drug therapy: Secondary | ICD-10-CM | POA: Diagnosis not present

## 2014-10-23 DIAGNOSIS — M109 Gout, unspecified: Secondary | ICD-10-CM | POA: Diagnosis not present

## 2014-10-23 DIAGNOSIS — N4340 Spermatocele of epididymis, unspecified: Secondary | ICD-10-CM | POA: Diagnosis not present

## 2014-10-23 DIAGNOSIS — I2510 Atherosclerotic heart disease of native coronary artery without angina pectoris: Secondary | ICD-10-CM | POA: Diagnosis not present

## 2014-10-23 DIAGNOSIS — E119 Type 2 diabetes mellitus without complications: Secondary | ICD-10-CM | POA: Diagnosis not present

## 2014-10-23 DIAGNOSIS — N189 Chronic kidney disease, unspecified: Secondary | ICD-10-CM | POA: Diagnosis not present

## 2014-10-23 DIAGNOSIS — E785 Hyperlipidemia, unspecified: Secondary | ICD-10-CM | POA: Diagnosis not present

## 2014-11-06 ENCOUNTER — Other Ambulatory Visit: Payer: Self-pay

## 2014-11-06 ENCOUNTER — Telehealth: Payer: Self-pay | Admitting: Family Medicine

## 2014-11-06 DIAGNOSIS — M545 Low back pain: Secondary | ICD-10-CM

## 2014-11-06 MED ORDER — CARVEDILOL 12.5 MG PO TABS: 12.5000 mg | ORAL_TABLET | Freq: Two times a day (BID) | ORAL | Status: DC

## 2014-11-06 MED ORDER — ALLOPURINOL 100 MG PO TABS: 100.0000 mg | ORAL_TABLET | Freq: Every day | ORAL | Status: DC

## 2014-11-06 NOTE — Telephone Encounter (Signed)
Pt's wife Pam called to request refills for Allopurinol 100 mg & Coreg 12.5 mg be sent to [facility]. Wife stated that [facility] was supposed to send request and she wanted to make sure we didn't set it aside. In allscripts it doesn't look like Coreg is due for refills because it was sent in last time on 07/08/14 for 3 month supply with 3 refills. Thanks TNP

## 2014-11-07 ENCOUNTER — Other Ambulatory Visit: Payer: Self-pay

## 2014-11-07 MED ORDER — CARVEDILOL 12.5 MG PO TABS: 12.5000 mg | ORAL_TABLET | Freq: Two times a day (BID) | ORAL | Status: DC

## 2014-11-07 MED ORDER — ALLOPURINOL 100 MG PO TABS: 100.0000 mg | ORAL_TABLET | Freq: Every day | ORAL | Status: DC

## 2014-11-12 NOTE — Telephone Encounter (Signed)
Pt's wife called to make sure we got the refill request from [facility] to refill generic for Lipitor 10mg , Omeprazole 20 mg, and Amlodipine Besylate 5 mg. Pt wife would like [place] to send the refills to [facility] and she stated the medications that were requested on 11/06/14 Allopurinol & Coreg are on the way from [place]. Thanks TNP

## 2014-11-13 ENCOUNTER — Other Ambulatory Visit: Payer: Self-pay

## 2014-11-13 DIAGNOSIS — E785 Hyperlipidemia, unspecified: Secondary | ICD-10-CM

## 2014-11-13 DIAGNOSIS — K219 Gastro-esophageal reflux disease without esophagitis: Secondary | ICD-10-CM

## 2014-11-13 DIAGNOSIS — I10 Essential (primary) hypertension: Secondary | ICD-10-CM

## 2014-11-13 MED ORDER — ATORVASTATIN CALCIUM 10 MG PO TABS: 10.0000 mg | ORAL_TABLET | Freq: Every day | ORAL | Status: DC

## 2014-11-13 MED ORDER — OMEPRAZOLE 20 MG PO CPDR: 20.0000 mg | DELAYED_RELEASE_CAPSULE | Freq: Two times a day (BID) | ORAL | Status: DC

## 2014-11-13 MED ORDER — AMLODIPINE BESYLATE 5 MG PO TABS: 5.0000 mg | ORAL_TABLET | Freq: Every day | ORAL | Status: DC

## 2014-12-03 ENCOUNTER — Encounter: Payer: Self-pay | Admitting: Family Medicine

## 2014-12-03 ENCOUNTER — Ambulatory Visit
Admission: RE | Admit: 2014-12-03 | Discharge: 2014-12-03 | Disposition: A | Discharge status: Home / Self Care | Payer: Medicare Other | Source: Ambulatory Visit | Attending: Family Medicine | Admitting: Family Medicine

## 2014-12-03 ENCOUNTER — Ambulatory Visit (INDEPENDENT_AMBULATORY_CARE_PROVIDER_SITE_OTHER): Payer: Medicare Other | Admitting: Family Medicine

## 2014-12-03 VITALS — BP 130/60 | HR 66 | Temp 98.3°F | Resp 16 | Ht 69.0 in | Wt 232.0 lb

## 2014-12-03 DIAGNOSIS — M545 Low back pain: Secondary | ICD-10-CM

## 2014-12-03 DIAGNOSIS — R1013 Epigastric pain: Secondary | ICD-10-CM

## 2014-12-03 DIAGNOSIS — J309 Allergic rhinitis, unspecified: Secondary | ICD-10-CM | POA: Diagnosis not present

## 2014-12-03 DIAGNOSIS — M25551 Pain in right hip: Secondary | ICD-10-CM

## 2014-12-03 DIAGNOSIS — Z0000 Encounter for general adult medical examination without abnormal findings: Secondary | ICD-10-CM

## 2014-12-03 DIAGNOSIS — M25552 Pain in left hip: Secondary | ICD-10-CM | POA: Diagnosis not present

## 2014-12-03 MED ORDER — LORATADINE 10 MG PO TABS: 10.0000 mg | ORAL_TABLET | Freq: Every day | ORAL | Status: DC

## 2014-12-03 NOTE — Progress Notes (Signed)
Patient ID: Macey Wearing [facility], male   DOB: 04-10-1946, 69 y.o.   MRN: [id number]       Patient: Ronald [name], Male    DOB: August 26, 1945, 69 y.o.   MRN: [id number] Visit Date: 12/03/2014  Today's Provider: [name], MD   Chief Complaint  Patient presents with  . Annual Exam    last CPE 09/13/13   Subjective:    Annual physical exam Ronald [name] is a 69 y.o. male who presents today for health maintenance and complete physical. He feels poorly. He reports exercising "my hips hurt, I do not walk very much". He reports he is sleeping fairly well.  ----------------------------------------------------------------- Pt is c/o hip pain biltaterally, LSpine.  Pt is also having epigastric pain that has been going on for 15 years.   There are no aggravating or alleviating factors with the abdominal pain or back pain. The back pain is in the lower thoracic area. It seems to be related to the abdominal pain. It is not related to meals according to patient. He said since feels as though it is a "buildup of gas ."  Review of Systems  Constitutional: Positive for activity change and fatigue. Negative for fever, appetite change and unexpected weight change.  HENT: Positive for rhinorrhea.        Sinus problems and nose runs" pt stated  Eyes: Negative.   Respiratory: Positive for shortness of breath.        Chest pain when having stomach problems" pt stated  Cardiovascular: Positive for chest pain.       Only when my I have stomach problems"  Gastrointestinal: Positive for abdominal pain and abdominal distention.       Sharp pain on right upper quadrant  Endocrine: Negative.   Genitourinary: Negative.   Musculoskeletal: Positive for back pain and arthralgias.  Skin: Negative.   Allergic/Immunologic: Negative.   Neurological: Positive for dizziness and light-headedness.       When I stand up I get dizzy"  Hematological: Bruises/bleeds easily.  Psychiatric/Behavioral: Negative.      Social History He  reports that he quit smoking about 28 years ago. He has never used smokeless tobacco. He reports that he does not drink alcohol or use illicit drugs.  Patient Active Problem List   Diagnosis Date Noted  . Absolute anemia 10/04/2014  . AA (aortic aneurysm) 10/04/2014  . Benign enlargement of prostate 10/04/2014  . Arteriosclerosis of coronary artery 10/04/2014  . Diabetes mellitus, type 2 10/04/2014  . Acid reflux 10/04/2014  . 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 HLD (hyperlipidemia) 10/04/2014  . BP (high blood pressure) 10/04/2014  . Osteoarthrosis 10/04/2014  . Adult BMI 30+ 10/04/2014  . 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 Chest pain, atypical 12/30/2012  . Chronic kidney disease (CKD), stage IV (severe)   . Coronary artery disease 11/09/2010  . Abdominal pain 11/09/2010  . Shortness of breath 11/12/2009  . Renal artery stenosis    . Mixed hyperlipidemia 09/04/2008  . History of redo bypass grafting 09/04/2008  . Type 2 diabetes mellitus with renal manifestations     Past Surgical History  Procedure Laterality Date  . Coronary artery bypass graft  1996  . Coronary artery bypass graft  March 2007    Redo of CABG from 1996  . Lung repair  1996  . Cholecystectomy    . Angioplasty      Family History His family history includes Heart attack  in his mother; Heart disease in his father and mother; Hyperlipidemia in his mother; Hypertension in his mother; Rheumatic fever in his father; Stroke in his mother.    Previous Medications   ACETAMINOPHEN (TYLENOL) 325 MG TABLET    Take 650 mg by mouth every 6 (six) hours as needed.   ALLOPURINOL (ZYLOPRIM) 100 MG TABLET    Take 1 tablet (100 mg total) by mouth at bedtime.   AMLODIPINE (NORVASC) 5 MG TABLET    Take 1 tablet (5 mg total) by mouth daily.   ASPIRIN 81 MG TABLET    Take 81 mg by mouth every morning.    ATORVASTATIN (LIPITOR) 10 MG TABLET    Take 1  tablet (10 mg total) by mouth daily.   CARVEDILOL (COREG) 12.5 MG TABLET    Take 1 tablet (12.5 mg total) by mouth 2 (two) times daily with a meal.   ENALAPRIL (VASOTEC) 2.5 MG TABLET    daily.    MAGNESIUM OXIDE (MAG-OX) 400 MG TABLET    Take 400 mg by mouth 2 (two) times daily.     MULTIPLE VITAMIN (MULTIVITAMIN) TABLET    Take 1 tablet by mouth every morning.    NITROGLYCERIN (NITROSTAT) 0.4 MG SL TABLET    Place 1 tablet (0.4 mg total) under the tongue every 5 (five) minutes as needed. May repeat up to three times.   OMEPRAZOLE (PRILOSEC) 20 MG CAPSULE    Take 1 capsule (20 mg total) by mouth 2 (two) times daily.   SODIUM BICARBONATE 325 MG TABLET    Take 325 mg by mouth 2 (two) times daily.     Patient Care Team: [name]., MD as PCP - General (Unknown Physician Specialty) [name]., MD (Unknown Physician Specialty)     Objective:   Vitals: BP 130/60 mmHg  Pulse 66  Temp(Src) 98.3 F (36.8 C) (Oral)  Resp 16  Ht 5\' 9"  (1.753 m)  Wt 232 lb (105.235 kg)  BMI 34.24 kg/m2   Physical Exam  Constitutional: He is oriented to person, place, and time. He appears well-developed and well-nourished.  HENT:  Head: Normocephalic and atraumatic.  Right Ear: External ear normal.  Left Ear: External ear normal.  Nose: Nose normal.  Mouth/Throat: Oropharynx is clear and moist.  Eyes: Conjunctivae and EOM are normal. Pupils are equal, round, and reactive to light.  Neck: Normal range of motion. Neck supple.  Cardiovascular: Normal rate, regular rhythm, normal heart sounds and intact distal pulses.   Pulmonary/Chest: Effort normal and breath sounds normal.  Abdominal: Soft. Bowel sounds are normal.  Truncal obesity noted with no tenderness.  Neurological: He is alert and oriented to person, place, and time.  Skin: Skin is warm and dry.  Psychiatric: He has a normal mood and affect. His behavior is normal. Judgment and thought content normal.     Depression  Screen PHQ 2/9 Scores 12/03/2014  PHQ - 2 Score 3  PHQ- 9 Score 9  Exception Documentation Medical reason      Assessment & Plan:     Routine Health Maintenance and Physical Exam  Exercise Activities and Dietary recommendations Goals    None      Immunization History  Administered Date(s) Administered  . Pneumococcal Conjugate-13 12/25/2013  . Pneumococcal Polysaccharide-23 03/04/2009  . Td 10/10/2003    Health Maintenance  Topic Date Due  . FOOT EXAM  12/10/1953  . OPHTHALMOLOGY EXAM  12/10/1953  . URINE MICROALBUMIN  12/10/1953  .  ZOSTAVAX  12/11/2003  . TETANUS/TDAP  10/09/2013  . HEMOGLOBIN A1C  11/26/2014  . INFLUENZA VACCINE  12/30/2014  . COLONOSCOPY  08/02/2018  . PNA vac Low Risk Adult  Completed      Discussed health benefits of physical activity, and encouraged him to engage in regular exercise appropriate for his age and condition.   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 Start with weight loss and specifically would try a gluten-free diet. He has tried a lactose-free diet. If this is unsuccessful we'll obtain an ultrasound of the abdomen. Specifically looking at the aorta. Would refer back to GI before I would do a CT scan.  Bilateral hip pain This is also been going on for multiple years. Weight loss something could also help this. Be arthritic or to be back related. Obtain sedimentation rate to rule out PMR. --------------------------------------------------------------------

## 2014-12-04 ENCOUNTER — Ambulatory Visit
Admission: RE | Admit: 2014-12-04 | Discharge: 2014-12-04 | Disposition: A | Discharge status: Home / Self Care | Payer: Medicare Other | Source: Ambulatory Visit | Attending: Family Medicine | Admitting: Family Medicine

## 2014-12-04 ENCOUNTER — Telehealth: Payer: Self-pay | Admitting: Family Medicine

## 2014-12-04 DIAGNOSIS — M5387 Other specified dorsopathies, lumbosacral region: Secondary | ICD-10-CM | POA: Insufficient documentation

## 2014-12-04 DIAGNOSIS — M25552 Pain in left hip: Secondary | ICD-10-CM | POA: Insufficient documentation

## 2014-12-04 DIAGNOSIS — M76891 Other specified enthesopathies of right lower limb, excluding foot: Secondary | ICD-10-CM | POA: Diagnosis not present

## 2014-12-04 DIAGNOSIS — M25559 Pain in unspecified hip: Secondary | ICD-10-CM | POA: Diagnosis present

## 2014-12-04 DIAGNOSIS — M5136 Other intervertebral disc degeneration, lumbar region: Secondary | ICD-10-CM | POA: Diagnosis not present

## 2014-12-04 DIAGNOSIS — M25551 Pain in right hip: Secondary | ICD-10-CM

## 2014-12-04 DIAGNOSIS — M4316 Spondylolisthesis, lumbar region: Secondary | ICD-10-CM | POA: Diagnosis not present

## 2014-12-04 DIAGNOSIS — M545 Low back pain: Secondary | ICD-10-CM

## 2014-12-04 DIAGNOSIS — M549 Dorsalgia, unspecified: Secondary | ICD-10-CM | POA: Diagnosis present

## 2014-12-04 DIAGNOSIS — M47816 Spondylosis without myelopathy or radiculopathy, lumbar region: Secondary | ICD-10-CM | POA: Diagnosis not present

## 2014-12-04 LAB — COMPREHENSIVE METABOLIC PANEL
ALK PHOS: 175 IU/L — AB (ref 39–117)
ALT: 20 IU/L (ref 0–44)
AST: 19 IU/L (ref 0–40)
Albumin/Globulin Ratio: 2.4 (ref 1.1–2.5)
Albumin: 4.6 g/dL (ref 3.5–4.8)
BILIRUBIN TOTAL: 1 mg/dL (ref 0.0–1.2)
BUN / CREAT RATIO: 14 (ref 10–22)
BUN: 32 mg/dL — AB (ref 8–27)
CHLORIDE: 104 mmol/L (ref 97–108)
CO2: 23 mmol/L (ref 18–29)
Calcium: 9.7 mg/dL (ref 8.6–10.2)
Creatinine, Ser: 2.31 mg/dL — ABNORMAL HIGH (ref 0.76–1.27)
GFR calc Af Amer: 32 mL/min/{1.73_m2} — ABNORMAL LOW (ref >59)
GFR calc non Af Amer: 28 mL/min/{1.73_m2} — ABNORMAL LOW (ref >59)
Globulin, Total: 1.9 g/dL (ref 1.5–4.5)
Glucose: 77 mg/dL (ref 65–99)
POTASSIUM: 5.8 mmol/L — AB (ref 3.5–5.2)
SODIUM: 140 mmol/L (ref 134–144)
Total Protein: 6.5 g/dL (ref 6.0–8.5)

## 2014-12-04 LAB — CBC WITH DIFFERENTIAL/PLATELET
BASOS: 1 %
Basophils Absolute: 0.1 10*3/uL (ref 0.0–0.2)
EOS (ABSOLUTE): 0.4 10*3/uL (ref 0.0–0.4)
EOS: 4 %
HEMATOCRIT: 41.8 % (ref 37.5–51.0)
Hemoglobin: 14.1 g/dL (ref 12.6–17.7)
IMMATURE GRANS (ABS): 0 10*3/uL (ref 0.0–0.1)
Immature Granulocytes: 0 %
LYMPHS: 24 %
Lymphocytes Absolute: 2 10*3/uL (ref 0.7–3.1)
MCH: 28.5 pg (ref 26.6–33.0)
MCHC: 33.7 g/dL (ref 31.5–35.7)
MCV: 85 fL (ref 79–97)
MONOCYTES: 9 %
Monocytes Absolute: 0.8 10*3/uL (ref 0.1–0.9)
NEUTROS ABS: 5.4 10*3/uL (ref 1.4–7.0)
NEUTROS PCT: 62 %
Platelets: 188 10*3/uL (ref 150–379)
RBC: 4.94 x10E6/uL (ref 4.14–5.80)
RDW: 15.3 % (ref 12.3–15.4)
WBC: 8.6 10*3/uL (ref 3.4–10.8)

## 2014-12-04 LAB — SEDIMENTATION RATE: SED RATE: 3 mm/h (ref 0–30)

## 2014-12-04 LAB — LIPASE: LIPASE: 48 U/L (ref 0–59)

## 2014-12-04 LAB — AMYLASE: Amylase: 79 U/L (ref 31–124)

## 2014-12-05 LAB — H PYLORI, IGM, IGG, IGA AB: H. pylori, IgA Abs: <9 units (ref 0.0–8.9)

## 2014-12-05 NOTE — Telephone Encounter (Signed)
See below

## 2014-12-05 NOTE — Telephone Encounter (Signed)
Pt wife, [name] is requesting x-ray results.  OI:[id number]

## 2014-12-06 NOTE — Progress Notes (Signed)
Advised.  Patient will let us know if he chooses to do PT.  Advised that if he does not set anything up at this time we can always do a referral in the future.  Patient has appointment next month and we can discuss again at that time.  ED

## 2014-12-13 ENCOUNTER — Encounter (HOSPITAL_COMMUNITY)
Admission: EM | Disposition: A | Discharge status: Home / Self Care | Payer: Medicare Other | Source: Home / Self Care | Attending: Cardiology

## 2014-12-13 ENCOUNTER — Inpatient Hospital Stay (HOSPITAL_COMMUNITY)
Admission: EM | Admit: 2014-12-13 | Discharge: 2014-12-14 | DRG: 247 | Disposition: A | Discharge status: Home / Self Care | Payer: Medicare Other | Attending: Cardiology | Admitting: Cardiology

## 2014-12-13 ENCOUNTER — Emergency Department (HOSPITAL_COMMUNITY): Payer: Medicare Other

## 2014-12-13 ENCOUNTER — Encounter (HOSPITAL_COMMUNITY): Payer: Self-pay

## 2014-12-13 DIAGNOSIS — Z951 Presence of aortocoronary bypass graft: Secondary | ICD-10-CM | POA: Diagnosis not present

## 2014-12-13 DIAGNOSIS — I25110 Atherosclerotic heart disease of native coronary artery with unstable angina pectoris: Secondary | ICD-10-CM | POA: Diagnosis not present

## 2014-12-13 DIAGNOSIS — Z87891 Personal history of nicotine dependence: Secondary | ICD-10-CM

## 2014-12-13 DIAGNOSIS — M5440 Lumbago with sciatica, unspecified side: Secondary | ICD-10-CM | POA: Diagnosis present

## 2014-12-13 DIAGNOSIS — Z7982 Long term (current) use of aspirin: Secondary | ICD-10-CM

## 2014-12-13 DIAGNOSIS — Z888 Allergy status to other drugs, medicaments and biological substances status: Secondary | ICD-10-CM | POA: Diagnosis not present

## 2014-12-13 DIAGNOSIS — R0602 Shortness of breath: Secondary | ICD-10-CM

## 2014-12-13 DIAGNOSIS — I214 Non-ST elevation (NSTEMI) myocardial infarction: Secondary | ICD-10-CM

## 2014-12-13 DIAGNOSIS — G8929 Other chronic pain: Secondary | ICD-10-CM | POA: Diagnosis present

## 2014-12-13 DIAGNOSIS — I119 Hypertensive heart disease without heart failure: Secondary | ICD-10-CM | POA: Diagnosis not present

## 2014-12-13 DIAGNOSIS — I509 Heart failure, unspecified: Secondary | ICD-10-CM | POA: Diagnosis not present

## 2014-12-13 DIAGNOSIS — I701 Atherosclerosis of renal artery: Secondary | ICD-10-CM | POA: Diagnosis present

## 2014-12-13 DIAGNOSIS — N184 Chronic kidney disease, stage 4 (severe): Secondary | ICD-10-CM

## 2014-12-13 DIAGNOSIS — Z880 Allergy status to penicillin: Secondary | ICD-10-CM | POA: Diagnosis not present

## 2014-12-13 DIAGNOSIS — Z885 Allergy status to narcotic agent status: Secondary | ICD-10-CM

## 2014-12-13 DIAGNOSIS — H9192 Unspecified hearing loss, left ear: Secondary | ICD-10-CM | POA: Diagnosis present

## 2014-12-13 DIAGNOSIS — Z79899 Other long term (current) drug therapy: Secondary | ICD-10-CM

## 2014-12-13 DIAGNOSIS — I130 Hypertensive heart and chronic kidney disease with heart failure and stage 1 through stage 4 chronic kidney disease, or unspecified chronic kidney disease: Secondary | ICD-10-CM | POA: Diagnosis present

## 2014-12-13 DIAGNOSIS — K210 Gastro-esophageal reflux disease with esophagitis: Secondary | ICD-10-CM | POA: Diagnosis present

## 2014-12-13 DIAGNOSIS — E782 Mixed hyperlipidemia: Secondary | ICD-10-CM | POA: Diagnosis not present

## 2014-12-13 DIAGNOSIS — R0609 Other forms of dyspnea: Secondary | ICD-10-CM | POA: Diagnosis present

## 2014-12-13 DIAGNOSIS — M1990 Unspecified osteoarthritis, unspecified site: Secondary | ICD-10-CM | POA: Diagnosis present

## 2014-12-13 DIAGNOSIS — I219 Acute myocardial infarction, unspecified: Secondary | ICD-10-CM | POA: Insufficient documentation

## 2014-12-13 DIAGNOSIS — Z8249 Family history of ischemic heart disease and other diseases of the circulatory system: Secondary | ICD-10-CM

## 2014-12-13 DIAGNOSIS — J449 Chronic obstructive pulmonary disease, unspecified: Secondary | ICD-10-CM | POA: Diagnosis not present

## 2014-12-13 DIAGNOSIS — I2510 Atherosclerotic heart disease of native coronary artery without angina pectoris: Secondary | ICD-10-CM

## 2014-12-13 DIAGNOSIS — Z882 Allergy status to sulfonamides status: Secondary | ICD-10-CM

## 2014-12-13 DIAGNOSIS — M109 Gout, unspecified: Secondary | ICD-10-CM | POA: Diagnosis present

## 2014-12-13 DIAGNOSIS — E1121 Type 2 diabetes mellitus with diabetic nephropathy: Secondary | ICD-10-CM

## 2014-12-13 DIAGNOSIS — I255 Ischemic cardiomyopathy: Secondary | ICD-10-CM | POA: Diagnosis present

## 2014-12-13 DIAGNOSIS — R079 Chest pain, unspecified: Secondary | ICD-10-CM | POA: Diagnosis not present

## 2014-12-13 DIAGNOSIS — K589 Irritable bowel syndrome without diarrhea: Secondary | ICD-10-CM | POA: Diagnosis present

## 2014-12-13 HISTORY — DX: Personal history of other diseases of the digestive system: Z87.19

## 2014-12-13 HISTORY — DX: Low back pain, unspecified: M54.50

## 2014-12-13 HISTORY — DX: Unspecified hearing loss, left ear: H91.92

## 2014-12-13 HISTORY — DX: Low back pain: M54.5

## 2014-12-13 HISTORY — DX: Other intervertebral disc degeneration, lumbar region: M51.36

## 2014-12-13 HISTORY — PX: CARDIAC CATHETERIZATION: SHX172

## 2014-12-13 HISTORY — DX: Chronic kidney disease, stage 4 (severe): N18.4

## 2014-12-13 HISTORY — DX: Pneumonia, unspecified organism: J18.9

## 2014-12-13 HISTORY — DX: Non-ST elevation (NSTEMI) myocardial infarction: I21.4

## 2014-12-13 HISTORY — DX: Other chronic pain: G89.29

## 2014-12-13 HISTORY — DX: Personal history of other diseases of the musculoskeletal system and connective tissue: Z87.39

## 2014-12-13 HISTORY — DX: Type 2 diabetes mellitus without complications: E11.9

## 2014-12-13 HISTORY — DX: Other intervertebral disc degeneration, lumbar region without mention of lumbar back pain or lower extremity pain: M51.369

## 2014-12-13 HISTORY — DX: Acute myocardial infarction, unspecified: I21.9

## 2014-12-13 LAB — PROTIME-INR
INR: 0.92 (ref 0.00–1.49)
INR: 1.04 (ref 0.00–1.49)
PROTHROMBIN TIME: 13.8 s (ref 11.6–15.2)
Prothrombin Time: 12.5 seconds (ref 11.6–15.2)

## 2014-12-13 LAB — CBC
HCT: 41.9 % (ref 39.0–52.0)
Hemoglobin: 13.8 g/dL (ref 13.0–17.0)
MCH: 28.6 pg (ref 26.0–34.0)
MCHC: 32.9 g/dL (ref 30.0–36.0)
MCV: 86.7 fL (ref 78.0–100.0)
Platelets: 177 10*3/uL (ref 150–400)
RBC: 4.83 MIL/uL (ref 4.22–5.81)
RDW: 14.3 % (ref 11.5–15.5)
WBC: 7.9 10*3/uL (ref 4.0–10.5)

## 2014-12-13 LAB — TROPONIN I
TROPONIN I: 1.05 ng/mL — AB (ref <0.031)
Troponin I: 1.05 ng/mL (ref <0.031)
Troponin I: 3.09 ng/mL (ref <0.031)

## 2014-12-13 LAB — POCT ACTIVATED CLOTTING TIME: Activated Clotting Time: 417 seconds

## 2014-12-13 LAB — BASIC METABOLIC PANEL
ANION GAP: 8 (ref 5–15)
Anion gap: 5 (ref 5–15)
BUN: 32 mg/dL — AB (ref 6–20)
BUN: 28 mg/dL — AB (ref 6–20)
CHLORIDE: 110 mmol/L (ref 101–111)
CO2: 26 mmol/L (ref 22–32)
CO2: 23 mmol/L (ref 22–32)
Calcium: 9.2 mg/dL (ref 8.9–10.3)
Calcium: 9 mg/dL (ref 8.9–10.3)
Chloride: 109 mmol/L (ref 101–111)
Creatinine, Ser: 2.61 mg/dL — ABNORMAL HIGH (ref 0.61–1.24)
Creatinine, Ser: 2.22 mg/dL — ABNORMAL HIGH (ref 0.61–1.24)
GFR calc Af Amer: 27 mL/min — ABNORMAL LOW (ref >60)
GFR calc Af Amer: 33 mL/min — ABNORMAL LOW (ref >60)
GFR calc non Af Amer: 28 mL/min — ABNORMAL LOW (ref >60)
GFR, EST NON AFRICAN AMERICAN: 23 mL/min — AB (ref >60)
Glucose, Bld: 132 mg/dL — ABNORMAL HIGH (ref 65–99)
Glucose, Bld: 142 mg/dL — ABNORMAL HIGH (ref 65–99)
Potassium: 4.9 mmol/L (ref 3.5–5.1)
Potassium: 4.4 mmol/L (ref 3.5–5.1)
Sodium: 140 mmol/L (ref 135–145)
Sodium: 141 mmol/L (ref 135–145)

## 2014-12-13 LAB — LIPID PANEL
Cholesterol: 135 mg/dL (ref 0–200)
HDL: 30 mg/dL — ABNORMAL LOW (ref >40)
LDL CALC: 80 mg/dL (ref 0–99)
Total CHOL/HDL Ratio: 4.5 RATIO
Triglycerides: 124 mg/dL (ref <150)
VLDL: 25 mg/dL (ref 0–40)

## 2014-12-13 LAB — I-STAT TROPONIN, ED: Troponin i, poc: 0.49 ng/mL (ref 0.00–0.08)

## 2014-12-13 LAB — GLUCOSE, CAPILLARY
Glucose-Capillary: 111 mg/dL — ABNORMAL HIGH (ref 65–99)
Glucose-Capillary: 108 mg/dL — ABNORMAL HIGH (ref 65–99)
Glucose-Capillary: 90 mg/dL (ref 65–99)
Glucose-Capillary: 99 mg/dL (ref 65–99)

## 2014-12-13 LAB — HEPARIN LEVEL (UNFRACTIONATED): HEPARIN UNFRACTIONATED: 0.32 [IU]/mL (ref 0.30–0.70)

## 2014-12-13 LAB — APTT: aPTT: 32 seconds (ref 24–37)

## 2014-12-13 LAB — TSH: TSH: 1.472 u[IU]/mL (ref 0.350–4.500)

## 2014-12-13 SURGERY — LEFT HEART CATH AND CORONARY ANGIOGRAPHY
Anesthesia: LOCAL

## 2014-12-13 MED ORDER — SODIUM CHLORIDE 0.9 % IV SOLN: 250.0000 mL | INTRAVENOUS | Status: DC | PRN

## 2014-12-13 MED ORDER — FENTANYL CITRATE (PF) 100 MCG/2ML IJ SOLN
INTRAMUSCULAR | Status: DC | PRN
  Administered 2014-12-13 (×3): 25 ug via INTRAVENOUS

## 2014-12-13 MED ORDER — ACETAMINOPHEN 325 MG PO TABS
650.0000 mg | ORAL_TABLET | ORAL | Status: DC | PRN
  Administered 2014-12-14: 650 mg via ORAL
  Filled 2014-12-13: qty 2

## 2014-12-13 MED ORDER — SODIUM CHLORIDE 0.9 % IJ SOLN: 3.0000 mL | INTRAMUSCULAR | Status: DC | PRN

## 2014-12-13 MED ORDER — PANTOPRAZOLE SODIUM 40 MG PO TBEC
40.0000 mg | DELAYED_RELEASE_TABLET | Freq: Every day | ORAL | Status: DC
  Administered 2014-12-13: 40 mg via ORAL
  Filled 2014-12-13: qty 1

## 2014-12-13 MED ORDER — TICAGRELOR 90 MG PO TABS
ORAL_TABLET | ORAL | Status: AC
  Filled 2014-12-13: qty 2

## 2014-12-13 MED ORDER — SODIUM CHLORIDE 0.9 % WEIGHT BASED INFUSION
3.0000 mL/kg/h | INTRAVENOUS | Status: DC
Start: 2014-12-13 — End: 2014-12-13
  Administered 2014-12-13: 3 mL/kg/h via INTRAVENOUS

## 2014-12-13 MED ORDER — ADENOSINE 6 MG/2ML IV SOLN
INTRAVENOUS | Status: AC
  Filled 2014-12-13: qty 2

## 2014-12-13 MED ORDER — LIDOCAINE HCL (PF) 1 % IJ SOLN
INTRAMUSCULAR | Status: AC
  Filled 2014-12-13: qty 30

## 2014-12-13 MED ORDER — BIVALIRUDIN BOLUS VIA INFUSION - CUPID
INTRAVENOUS | Status: DC | PRN
  Administered 2014-12-13: 77.7 mg via INTRAVENOUS

## 2014-12-13 MED ORDER — BIVALIRUDIN 250 MG IV SOLR
INTRAVENOUS | Status: AC
  Filled 2014-12-13: qty 250

## 2014-12-13 MED ORDER — AMLODIPINE BESYLATE 5 MG PO TABS
5.0000 mg | ORAL_TABLET | Freq: Every day | ORAL | Status: DC
  Administered 2014-12-13: 5 mg via ORAL
  Filled 2014-12-13: qty 1

## 2014-12-13 MED ORDER — TICAGRELOR 90 MG PO TABS
ORAL_TABLET | ORAL | Status: DC | PRN
  Administered 2014-12-13: 180 mg via ORAL

## 2014-12-13 MED ORDER — ACETAMINOPHEN 500 MG PO TABS
1000.0000 mg | ORAL_TABLET | Freq: Once | ORAL | Status: AC
  Administered 2014-12-13: 1000 mg via ORAL
  Filled 2014-12-13: qty 2

## 2014-12-13 MED ORDER — MIDAZOLAM HCL 2 MG/2ML IJ SOLN
INTRAMUSCULAR | Status: AC
  Filled 2014-12-13: qty 2

## 2014-12-13 MED ORDER — SODIUM BICARBONATE 650 MG PO TABS
325.0000 mg | ORAL_TABLET | Freq: Two times a day (BID) | ORAL | Status: DC
  Administered 2014-12-13 (×2): 325 mg via ORAL
  Filled 2014-12-13: qty 1
  Filled 2014-12-13 (×3): qty 0.5

## 2014-12-13 MED ORDER — INSULIN ASPART 100 UNIT/ML ~~LOC~~ SOLN: 0.0000 [IU] | Freq: Three times a day (TID) | SUBCUTANEOUS | Status: DC

## 2014-12-13 MED ORDER — ASPIRIN EC 81 MG PO TBEC
81.0000 mg | DELAYED_RELEASE_TABLET | Freq: Every morning | ORAL | Status: DC
  Administered 2014-12-13: 81 mg via ORAL
  Filled 2014-12-13: qty 1

## 2014-12-13 MED ORDER — ADENOSINE (DIAGNOSTIC) FOR INTRACORONARY USE
INTRAVENOUS | Status: DC | PRN
  Administered 2014-12-13 (×4): 60 ug via INTRACORONARY

## 2014-12-13 MED ORDER — HEPARIN (PORCINE) IN NACL 100-0.45 UNIT/ML-% IJ SOLN
1350.0000 [IU]/h | INTRAMUSCULAR | Status: DC
  Administered 2014-12-13: 1300 [IU]/h via INTRAVENOUS
  Filled 2014-12-13: qty 250

## 2014-12-13 MED ORDER — ASPIRIN 81 MG PO CHEW: 81.0000 mg | CHEWABLE_TABLET | Freq: Every day | ORAL | Status: DC

## 2014-12-13 MED ORDER — ASPIRIN 81 MG PO CHEW: 81.0000 mg | CHEWABLE_TABLET | ORAL | Status: DC

## 2014-12-13 MED ORDER — FENTANYL CITRATE (PF) 100 MCG/2ML IJ SOLN
INTRAMUSCULAR | Status: AC
  Filled 2014-12-13: qty 2

## 2014-12-13 MED ORDER — IOHEXOL 350 MG/ML SOLN
INTRAVENOUS | Status: DC | PRN
  Administered 2014-12-13: 75 mL via INTRA_ARTERIAL

## 2014-12-13 MED ORDER — ATORVASTATIN CALCIUM 10 MG PO TABS
10.0000 mg | ORAL_TABLET | Freq: Every day | ORAL | Status: DC
  Administered 2014-12-13: 10 mg via ORAL
  Filled 2014-12-13: qty 1

## 2014-12-13 MED ORDER — SODIUM CHLORIDE 0.9 % IJ SOLN
3.0000 mL | Freq: Two times a day (BID) | INTRAMUSCULAR | Status: DC
  Administered 2014-12-13: 3 mL via INTRAVENOUS

## 2014-12-13 MED ORDER — TICAGRELOR 90 MG PO TABS
90.0000 mg | ORAL_TABLET | Freq: Two times a day (BID) | ORAL | Status: DC
  Administered 2014-12-14: 05:00:00 90 mg via ORAL
  Filled 2014-12-13: qty 1

## 2014-12-13 MED ORDER — ASPIRIN 81 MG PO CHEW
324.0000 mg | CHEWABLE_TABLET | Freq: Once | ORAL | Status: AC
  Administered 2014-12-13: 324 mg via ORAL
  Filled 2014-12-13: qty 4

## 2014-12-13 MED ORDER — LIDOCAINE HCL (PF) 1 % IJ SOLN
INTRAMUSCULAR | Status: DC | PRN
  Administered 2014-12-13: 17 mg

## 2014-12-13 MED ORDER — ALLOPURINOL 100 MG PO TABS
100.0000 mg | ORAL_TABLET | Freq: Every day | ORAL | Status: DC
  Administered 2014-12-13: 100 mg via ORAL
  Filled 2014-12-13 (×2): qty 1

## 2014-12-13 MED ORDER — NITROGLYCERIN 2 % TD OINT
1.0000 [in_us] | TOPICAL_OINTMENT | Freq: Once | TRANSDERMAL | Status: AC
  Administered 2014-12-13: 1 [in_us] via TOPICAL
  Filled 2014-12-13: qty 1

## 2014-12-13 MED ORDER — CARVEDILOL 12.5 MG PO TABS
12.5000 mg | ORAL_TABLET | Freq: Two times a day (BID) | ORAL | Status: DC
  Administered 2014-12-13 – 2014-12-14 (×3): 12.5 mg via ORAL
  Filled 2014-12-13 (×3): qty 1

## 2014-12-13 MED ORDER — TICAGRELOR 90 MG PO TABS
ORAL_TABLET | ORAL | Status: AC
  Filled 2014-12-13: qty 1

## 2014-12-13 MED ORDER — NITROGLYCERIN 1 MG/10 ML FOR IR/CATH LAB
INTRA_ARTERIAL | Status: AC
  Filled 2014-12-13: qty 10

## 2014-12-13 MED ORDER — ATROPINE SULFATE 0.1 MG/ML IJ SOLN
INTRAMUSCULAR | Status: AC
  Filled 2014-12-13: qty 10

## 2014-12-13 MED ORDER — INSULIN ASPART 100 UNIT/ML ~~LOC~~ SOLN: 0.0000 [IU] | Freq: Every day | SUBCUTANEOUS | Status: DC

## 2014-12-13 MED ORDER — MIDAZOLAM HCL 2 MG/2ML IJ SOLN
INTRAMUSCULAR | Status: DC | PRN
  Administered 2014-12-13 (×3): 1 mg via INTRAVENOUS

## 2014-12-13 MED ORDER — ADULT MULTIVITAMIN W/MINERALS CH
1.0000 | ORAL_TABLET | Freq: Every morning | ORAL | Status: DC
  Administered 2014-12-13: 1 via ORAL
  Filled 2014-12-13: qty 1

## 2014-12-13 MED ORDER — SODIUM CHLORIDE 0.9 % WEIGHT BASED INFUSION: 1.0000 mL/kg/h | INTRAVENOUS | Status: DC

## 2014-12-13 MED ORDER — HEPARIN BOLUS VIA INFUSION
4000.0000 [IU] | Freq: Once | INTRAVENOUS | Status: AC
  Administered 2014-12-13: 4000 [IU] via INTRAVENOUS
  Filled 2014-12-13: qty 4000

## 2014-12-13 MED ORDER — ONDANSETRON HCL 4 MG/2ML IJ SOLN: 4.0000 mg | Freq: Four times a day (QID) | INTRAMUSCULAR | Status: DC | PRN

## 2014-12-13 MED ORDER — SODIUM CHLORIDE 0.9 % IV SOLN: INTRAVENOUS | Status: AC

## 2014-12-13 MED ORDER — SODIUM CHLORIDE 0.9 % IJ SOLN: 3.0000 mL | Freq: Two times a day (BID) | INTRAMUSCULAR | Status: DC

## 2014-12-13 MED ORDER — MAGNESIUM OXIDE 400 (241.3 MG) MG PO TABS
400.0000 mg | ORAL_TABLET | Freq: Two times a day (BID) | ORAL | Status: DC
  Administered 2014-12-13 (×2): 400 mg via ORAL
  Filled 2014-12-13 (×2): qty 1

## 2014-12-13 MED ORDER — HEPARIN (PORCINE) IN NACL 2-0.9 UNIT/ML-% IJ SOLN
INTRAMUSCULAR | Status: AC
  Filled 2014-12-13: qty 1500

## 2014-12-13 MED ORDER — SODIUM CHLORIDE 0.9 % IV SOLN
250.0000 mg | INTRAVENOUS | Status: DC | PRN
  Administered 2014-12-13: 1.75 mg/kg/h via INTRAVENOUS

## 2014-12-13 SURGICAL SUPPLY — 17 items
BALLN MINITREK RX 2.0X12 (BALLOONS) ×3
BALLOON MINITREK RX 2.0X12 (BALLOONS) ×2 IMPLANT
CATH INFINITI 5FR MULTPACK ANG (CATHETERS) ×3 IMPLANT
DEVICE CONTINUOUS FLUSH (MISCELLANEOUS) ×3 IMPLANT
GUIDE CATH RUNWAY 6FR AL 1 (CATHETERS) ×3 IMPLANT
KIT ENCORE 26 ADVANTAGE (KITS) ×3 IMPLANT
KIT HEART LEFT (KITS) ×3 IMPLANT
PACK CARDIAC CATHETERIZATION (CUSTOM PROCEDURE TRAY) ×3 IMPLANT
SHEATH PINNACLE 5F 10CM (SHEATH) ×3 IMPLANT
SHEATH PINNACLE 6F 10CM (SHEATH) ×3 IMPLANT
STENT SYNERGY DES 3.5X12 (Permanent Stent) ×3 IMPLANT
SYR MEDRAD MARK V 150ML (SYRINGE) ×3 IMPLANT
TRANSDUCER W/STOPCOCK (MISCELLANEOUS) ×3 IMPLANT
TUBING CIL FLEX 10 FLL-RA (TUBING) ×3 IMPLANT
VALVE GUARDIAN II ~~LOC~~ HEMO (MISCELLANEOUS) ×3 IMPLANT
WIRE ASAHI PROWATER 180CM (WIRE) ×3 IMPLANT
WIRE EMERALD 3MM-J .035X150CM (WIRE) ×3 IMPLANT

## 2014-12-13 NOTE — Progress Notes (Signed)
[facility] for Heparin Indication: chest pain/ACS  Allergies  Allergen Reactions  . Ciprofloxacin     GI upset  . Hydrochlorothiazide Other (See Comments)    dehydrates Dehydration  . Hydrocodone     Stomach upset Stomach upset  . Hydrocodone-Acetaminophen     Stomach upset  . Hydrocodone-Acetaminophen Nausea Only  . Sulfa Antibiotics   . Sulfacetamide Sodium   . Penicillins Hives and Rash    Patient Measurements: Height: 5\' 9"  (175.3 cm) Weight: 228 lb 4.8 oz (103.556 kg) IBW/kg (Calculated) : 70.7 Heparin Dosing Weight: 93 kg  Vital Signs: Temp: 98.3 F (36.8 C) (07/15 0807) Temp Source: Oral (07/15 0807) BP: 129/61 mmHg (07/15 1053) Pulse Rate: 68 (07/15 0807)  Labs:  Recent Labs  12/13/14 0423 12/13/14 1116 12/13/14 1337  HGB 13.8  --   --   HCT 41.9  --   --   PLT 177  --   --   APTT 32  --   --   LABPROT 12.5 13.8  --   INR 0.92 1.04  --   HEPARINUNFRC  --   --  0.32  CREATININE 2.61*  --   --   TROPONINI  --  1.05*  --     Estimated Creatinine Clearance: 30.8 mL/min (by C-G formula based on Cr of 2.61).   Medical History: Past Medical History  Diagnosis Date  . CAD (coronary artery disease) 2007    a. s/p bypass and subsequent redo bypass in 07 b. Lexiscan Myoview 12/31/12: no evidence of ischemia, EF 53% and mild septal HK as above   . Ischemic cardiomyopathy 2006    EF 40% to 50% by 2D echo in 2006;  Echo 12/31/12: Mild LVH, EF 50-55%, normal wall motion.   . Chronic kidney disease     Creatinine 1.9 to 2  . Hyperlipidemia   . Hypertension   . Renal artery stenosis   . Gout   . Emphysema   . COPD (chronic obstructive pulmonary disease)   . Diabetes mellitus     Diet control   . Arthritis   . GERD (gastroesophageal reflux disease)   . Esophageal stricture 07/02/1998    EGD  . Anginal pain   . Dysrhythmia     bigeminy pvcs  . CHF (congestive heart failure)     Assessment: 69 y.o. male continuing  on heparin IV for NSTEMI (no anticoag pta). Pt with h/o CAD and CABG 1996 with redo 2007. CBC ok at baseline. No bleed documented. To hydrate/check BMET this afternoon and proceed with cath if SCr acceptable.  HL therapeutic x1 0.32 after bolus and increase to 1300 units/h - to increase slightly to keep in range. No bleeding or IV line issues per RN.  Goal of Therapy:  Heparin level 0.3-0.7 units/ml Monitor platelets by anticoagulation protocol: Yes   Plan:  Increase Heparin gtt slightly to 1350 units/hr to keep in range  8 hr heparin level to confirm  Daily heparin level and CBC  Mon s/sx bleed  Cath today? - f/u heparin plans   Elicia Lamp, PharmD Clinical Pharmacist - Resident Pager [phone] 12/13/2014 2:23 PM

## 2014-12-13 NOTE — Care Management Note (Signed)
Case Management Note  Patient Details  Name: [name] MRN: [id number] Date of Birth: 25-Mar-1946  Subjective/Objective: Pt admitted for cp increased troponin. Nstemi- plan for cath. Per MD notes: Pt with significant renal insufficiency and we will need to hydrate him prior to the cardiac catheterization. Plan to check a basic medical profile later this afternoon. Question cath later today if Cr improved per MD.                     Action/Plan: No needs identified by CM at this time.    Expected Discharge Date:                  Expected Discharge Plan:  Home/Self Care  In-House Referral:     Discharge planning Services  CM Consult  Post Acute Care Choice:    Choice offered to:     DME Arranged:    DME Agency:     HH Arranged:    HH Agency:     Status of Service:  In process, will continue to follow  Medicare Important Message Given:    Date Medicare IM Given:    Medicare IM give by:    Date Additional Medicare IM Given:    Additional Medicare Important Message give by:     If discussed at [place] of Stay Meetings, dates discussed:    Additional Comments:  Bethena Roys, RN 12/13/2014, 11:53 AM

## 2014-12-13 NOTE — ED Notes (Addendum)
Pt from home, complaining of chest pain x 2 weeks with episodes that are more severe than others. Pt states that this morning around 0230 his CP increased and he had some SOB and radiations to his back and bilateral shoulders, pt was lying in bed when the CP started..... Pt denies taking any medication. Pt states that he is not in any pain now and in NAD. Pt has hx of MI and 2 open heart surgeries here. Pt alert and oriented x 4. Pt states that "he hasn't felt good in 2 weeks"

## 2014-12-13 NOTE — ED Provider Notes (Signed)
CSN: [id number]     Arrival date & time 12/13/14  0409 History   First MD Initiated Contact with Patient 12/13/14 0449     Chief Complaint  Patient presents with  . Chest Pain     (Consider location/radiation/quality/duration/timing/severity/associated sxs/prior Treatment) HPI  Patient has a history coronary artery disease. He had four-vessel bypass done in 1996 and had a three-vessel revision done in 2007. He also has had 2 stents placed. He relates whenever he is having cardiac problems he gets abdominal pain. He started getting chest pain and abdominal pain 2-3 weeks ago. He states he has been getting intermittent chest pressure that lasts 1-2 hours that sometimes radiates into his arms. He has had constant abdominal pain for the past 2-3 weeks. This morning at 2:30 AM he was awakened from sleep with a central chest pain that radiated behind his neck and between his shoulder blades. He states his pain only lasted a few minutes. However he states while he was in x-ray and he had to stand up he got chest pressure again for a few minutes. He is currently pain free. He denies nausea, vomiting, diarrhea. He has had constipation but did a laxity of this week. He denies any fevers. He denies shortness of breath but has had dyspnea on exertion. He denies any swelling of his legs. He states in retrospect he should've gone to see his cardiologist 3 weeks ago when he started having chest pain.  PCP Dr [name] Cardiology Dr Julianne Handler  Past Medical History  Diagnosis Date  . CAD (coronary artery disease) 2007    a. s/p bypass and subsequent redo bypass in 07 b. Lexiscan Myoview 12/31/12: no evidence of ischemia, EF 53% and mild septal HK as above   . Ischemic cardiomyopathy 2006    EF 40% to 50% by 2D echo in 2006;  Echo 12/31/12: Mild LVH, EF 50-55%, normal wall motion.   . Chronic kidney disease     Creatinine 1.9 to 2  . Hyperlipidemia   . Hypertension   . Renal artery stenosis   . Gout   .  Emphysema   . COPD (chronic obstructive pulmonary disease)   . Diabetes mellitus     Diet control   . Arthritis   . GERD (gastroesophageal reflux disease)   . Esophageal stricture 07/02/1998    EGD  . Anginal pain   . Dysrhythmia     bigeminy pvcs  . CHF (congestive heart failure)    Past Surgical History  Procedure Laterality Date  . Coronary artery bypass graft  1996  . Coronary artery bypass graft  March 2007    Redo of CABG from 1996  . Lung repair  1996  . Cholecystectomy    . Angioplasty     Family History  Problem Relation Age of Onset  . Heart attack Mother     MI  . Stroke Mother   . Heart disease Mother   . Hypertension Mother   . Hyperlipidemia Mother   . Heart disease Father   . Rheumatic fever Father    History  Substance Use Topics  . Smoking status: Former Smoker -- 3.00 packs/day for 20 years    Quit date: 07/25/1986  . Smokeless tobacco: Never Used  . Alcohol Use: No  lives at home Lives with spouse  Review of Systems  All other systems reviewed and are negative.     Allergies  Ciprofloxacin; Hydrochlorothiazide; Hydrocodone; Hydrocodone-acetaminophen; Hydrocodone-acetaminophen; Sulfa antibiotics; Sulfacetamide sodium; and Penicillins  Home Medications   Prior to Admission medications   Medication Sig Start Date End Date Taking? Authorizing Provider  acetaminophen (TYLENOL) 325 MG tablet Take 650 mg by mouth every 6 (six) hours as needed.   Yes Historical Provider, MD  allopurinol (ZYLOPRIM) 100 MG tablet Take 1 tablet (100 mg total) by mouth at bedtime. 11/07/14  Yes Richard Maceo Pro., MD  amLODipine (NORVASC) 5 MG tablet Take 1 tablet (5 mg total) by mouth daily. 11/13/14  Yes Richard Maceo Pro., MD  aspirin 81 MG tablet Take 81 mg by mouth every morning.    Yes Historical Provider, MD  atorvastatin (LIPITOR) 10 MG tablet Take 1 tablet (10 mg total) by mouth daily. 11/13/14  Yes Richard Maceo Pro., MD  carvedilol (COREG) 12.5 MG  tablet Take 1 tablet (12.5 mg total) by mouth 2 (two) times daily with a meal. 11/07/14  Yes [name]., MD  magnesium oxide (MAG-OX) 400 MG tablet Take 400 mg by mouth 2 (two) times daily.     Yes Historical Provider, MD  Multiple Vitamin (MULTIVITAMIN) tablet Take 1 tablet by mouth every morning.    Yes Historical Provider, MD  nitroGLYCERIN (NITROSTAT) 0.4 MG SL tablet Place 1 tablet (0.4 mg total) under the tongue every 5 (five) minutes as needed. May repeat up to three times. 10/22/14  Yes Burnell Blanks, MD  omeprazole (PRILOSEC) 20 MG capsule Take 1 capsule (20 mg total) by mouth 2 (two) times daily. 11/13/14  Yes Richard Maceo Pro., MD  sodium bicarbonate 325 MG tablet Take 325 mg by mouth 2 (two) times daily.    Yes Historical Provider, MD  loratadine (CLARITIN) 10 MG tablet Take 1 tablet (10 mg total) by mouth daily. Patient not taking: Reported on 12/13/2014 12/03/14   [name]., MD   BP 148/80 mmHg  Pulse 73  Temp(Src) 97.8 F (36.6 C) (Oral)  Resp 13  Ht 5\' 9"  (1.753 m)  Wt 232 lb (105.235 kg)  BMI 34.24 kg/m2  SpO2 96%  Vital signs normal   Physical Exam  Constitutional: He is oriented to person, place, and time. He appears well-developed and well-nourished.  Non-toxic appearance. He does not appear ill. No distress.  HENT:  Head: Normocephalic and atraumatic.  Right Ear: External ear normal.  Left Ear: External ear normal.  Nose: Nose normal. No mucosal edema or rhinorrhea.  Mouth/Throat: Oropharynx is clear and moist and mucous membranes are normal. No dental abscesses or uvula swelling.  Eyes: Conjunctivae and EOM are normal. Pupils are equal, round, and reactive to light.  Neck: Normal range of motion and full passive range of motion without pain. Neck supple.  Cardiovascular: Normal rate, regular rhythm and normal heart sounds.  Exam reveals no gallop and no friction rub.   No murmur heard. Pulmonary/Chest: Effort normal and breath sounds  normal. No respiratory distress. He has no wheezes. He has no rhonchi. He has no rales. He exhibits no tenderness and no crepitus.  Abdominal: Soft. Normal appearance and bowel sounds are normal. He exhibits no distension. There is no tenderness. There is no rebound and no guarding.  Musculoskeletal: Normal range of motion. He exhibits no edema or tenderness.  Moves all extremities well.   Neurological: He is alert and oriented to person, place, and time. He has normal strength. No cranial nerve deficit.  Skin: Skin is warm, dry and intact. No rash noted. No erythema. No pallor.  Psychiatric: He has a  normal mood and affect. His speech is normal and behavior is normal. His mood appears not anxious.  Nursing note and vitals reviewed.   ED Course  Procedures (including critical care time)  Medications  heparin ADULT infusion 100 units/mL (25000 units/250 mL) (1,300 Units/hr Intravenous New Bag/Given 12/13/14 0552)  acetaminophen (TYLENOL) tablet 1,000 mg (1,000 mg Oral Given 12/13/14 0530)  nitroGLYCERIN (NITROGLYN) 2 % ointment 1 inch (1 inch Topical Given 12/13/14 0530)  heparin bolus via infusion 4,000 Units (4,000 Units Intravenous Given 12/13/14 0552)  aspirin chewable tablet 324 mg (324 mg Oral Given 12/13/14 [id number])   Patient refuses IV nitroglycerin because of severe headache. He is agreeable however to getting nitroglycerin paste placed on his legs. He was given a heparin bolus and started on a heparin drip. He is currently pain-free and he was advised to be got pain to let his nurse know right away. We discussed his troponin was positive and he had some changes on his EKG and he would need admission to the hospital.  05:18 Dr Eula Fried, will come see patient.    Labs Review Troponin is positive at [id number]  BASIC metabolic panel is normal except BUN elevated at 32, creatinine elevated at 2.61, glucose elevated at 132, GFR low at 23  PT normal index CBC normal   Imaging Review Dg Chest  2 View  12/13/2014   CLINICAL DATA:  Chest pain for 2 weeks.  EXAM: CHEST  2 VIEW  COMPARISON:  12/30/2012  FINDINGS: Postoperative changes in the mediastinum. Shallow inspiration. Mild cardiac enlargement without vascular congestion. Central interstitial changes suggest chronic bronchitis. No focal airspace disease or consolidation in the lungs. No blunting of costophrenic angles. No pneumothorax.  IMPRESSION: Cardiac enlargement. Chronic bronchitic changes in the lungs. No evidence of active consolidation.   Electronically Signed   By: Lucienne Capers M.D.   On: 12/13/2014 05:01     EKG Interpretation   Date/Time:  Friday December 13 2014 04:16:43 EDT Ventricular Rate:  70 PR Interval:  169 QRS Duration: 98 QT Interval:  402 QTC Calculation: 434 R Axis:   -16 Text Interpretation:  Sinus rhythm Ventricular premature complex  Borderline left axis deviation Nonspecific repol abnormality, diffuse  leads Since last tracing 31 Dec 2012  T wave inversion now evident in  Inferior leads ST now depressed in Lateral leads Confirmed by Kareem Cathey  MD-I,  Kenden Brandt ([id number]) on 12/13/2014 4:43:28 AM      MDM   Final diagnoses:  NSTEMI (non-ST elevated myocardial infarction)    Plan admission   Rolland Porter, MD, FACEP   CRITICAL CARE Performed by: Rolland Porter L Total critical care time: 33 min Critical care time was exclusive of separately billable procedures and treating other patients. Critical care was necessary to treat or prevent imminent or life-threatening deterioration. Critical care was time spent personally by me on the following activities: development of treatment plan with patient and/or surrogate as well as nursing, discussions with consultants, evaluation of patient's response to treatment, examination of patient, obtaining history from patient or surrogate, ordering and performing treatments and interventions, ordering and review of laboratory studies, ordering and review of radiographic  studies, pulse oximetry and re-evaluation of patient's condition.     Rolland Porter, MD 12/13/14 [phone]

## 2014-12-13 NOTE — Progress Notes (Signed)
ANTICOAGULATION CONSULT NOTE - Initial Consult  Pharmacy Consult for Heparin Indication: chest pain/ACS  Allergies  Allergen Reactions  . Ciprofloxacin     GI upset  . Hydrochlorothiazide Other (See Comments)    dehydrates Dehydration  . Hydrocodone     Stomach upset Stomach upset  . Hydrocodone-Acetaminophen     Stomach upset  . Hydrocodone-Acetaminophen Nausea Only  . Sulfa Antibiotics   . Sulfacetamide Sodium   . Penicillins Hives and Rash    Patient Measurements: Height: 5\' 9"  (175.3 cm) Weight: 232 lb (105.235 kg) IBW/kg (Calculated) : 70.7 Heparin Dosing Weight: 93 kg  Vital Signs: Temp: 97.8 F (36.6 C) (07/15 0418) Temp Source: Oral (07/15 0418) BP: 144/72 mmHg (07/15 0416) Pulse Rate: 77 (07/15 0416)  Labs: No results for input(s): HGB, HCT, PLT, APTT, LABPROT, INR, HEPARINUNFRC, CREATININE, CKTOTAL, CKMB, TROPONINI in the last 72 hours.  Estimated Creatinine Clearance: 35.1 mL/min (by C-G formula based on Cr of 2.31).   Medical History: Past Medical History  Diagnosis Date  . CAD (coronary artery disease) 2007    a. s/p bypass and subsequent redo bypass in 07 b. Lexiscan Myoview 12/31/12: no evidence of ischemia, EF 53% and mild septal HK as above   . Ischemic cardiomyopathy 2006    EF 40% to 50% by 2D echo in 2006;  Echo 12/31/12: Mild LVH, EF 50-55%, normal wall motion.   . Chronic kidney disease     Creatinine 1.9 to 2  . Hyperlipidemia   . Hypertension   . Renal artery stenosis   . Gout   . Emphysema   . COPD (chronic obstructive pulmonary disease)   . Diabetes mellitus     Diet control   . Arthritis   . GERD (gastroesophageal reflux disease)   . Esophageal stricture 07/02/1998    EGD  . Anginal pain   . Dysrhythmia     bigeminy pvcs  . CHF (congestive heart failure)     Medications:  Awaiting home med rec  Assessment: 69 y.o. male presents with CP. Pt with h/o CAD and CABG 1996 with redo 2007. To begin heparin for r/o ACS. Baseline  labs pending.  Goal of Therapy:  Heparin level 0.3-0.7 units/ml Monitor platelets by anticoagulation protocol: Yes   Plan:  Heparin IV bolus 4000 units Heparin gtt at 1300 units/hr  Will f/u 8 hr heparin level Daily heparin level and CBC  Sherlon Handing, PharmD, BCPS Clinical pharmacist, pager [phone] 12/13/2014,5:11 AM

## 2014-12-13 NOTE — Progress Notes (Signed)
Site area: right groin  Site Prior to Removal:  Level 1  Pressure Applied For 20 MINUTES    Minutes Beginning at 2025  Manual:   Yes.    Patient Status During Pull:  A X O X 3  Post Pull Groin Site:  Level 1  Post Pull Instructions Given:  Yes.    Post Pull Pulses Present:  Yes.    Dressing Applied:  Yes.    Comments:  Pt tolerated sheath removal without complications will continue to monitor patient and groin site

## 2014-12-13 NOTE — Progress Notes (Signed)
UR Completed Jaslyn Bansal Graves-Bigelow, RN,BSN [phone]  

## 2014-12-13 NOTE — Progress Notes (Signed)
ANTICOAGULATION CONSULT NOTE - Initial Consult  Pharmacy Consult for Heparin Indication: chest pain/ACS  Allergies  Allergen Reactions  . Ciprofloxacin     GI upset  . Hydrochlorothiazide Other (See Comments)    dehydrates Dehydration  . Hydrocodone     Stomach upset Stomach upset  . Hydrocodone-Acetaminophen     Stomach upset  . Hydrocodone-Acetaminophen Nausea Only  . Sulfa Antibiotics   . Sulfacetamide Sodium   . Penicillins Hives and Rash    Patient Measurements: Height: 5\' 9"  (175.3 cm) Weight: 232 lb (105.235 kg) IBW/kg (Calculated) : 70.7 Heparin Dosing Weight: 93 kg  Vital Signs: Temp: 97.8 F (36.6 C) (07/15 0418) Temp Source: Oral (07/15 0418) BP: 133/77 mmHg (07/15 0730) Pulse Rate: 84 (07/15 0730)  Labs:  Recent Labs  12/13/14 0423  HGB 13.8  HCT 41.9  PLT 177  APTT 32  LABPROT 12.5  INR 0.92  CREATININE 2.61*    Estimated Creatinine Clearance: 31 mL/min (by C-G formula based on Cr of 2.61).   Medical History: Past Medical History  Diagnosis Date  . CAD (coronary artery disease) 2007    a. s/p bypass and subsequent redo bypass in 07 b. Lexiscan Myoview 12/31/12: no evidence of ischemia, EF 53% and mild septal HK as above   . Ischemic cardiomyopathy 2006    EF 40% to 50% by 2D echo in 2006;  Echo 12/31/12: Mild LVH, EF 50-55%, normal wall motion.   . Chronic kidney disease     Creatinine 1.9 to 2  . Hyperlipidemia   . Hypertension   . Renal artery stenosis   . Gout   . Emphysema   . COPD (chronic obstructive pulmonary disease)   . Diabetes mellitus     Diet control   . Arthritis   . GERD (gastroesophageal reflux disease)   . Esophageal stricture 07/02/1998    EGD  . Anginal pain   . Dysrhythmia     bigeminy pvcs  . CHF (congestive heart failure)     Medications:  Awaiting home med rec  Assessment: 69 y.o. male presents with CP. Pt with h/o CAD and CABG 1996 with redo 2007. To begin heparin for r/o ACS. CBC ok at  baseline.  Goal of Therapy:  Heparin level 0.3-0.7 units/ml Monitor platelets by anticoagulation protocol: Yes   Plan:  Heparin IV bolus 4000 units Heparin gtt at 1300 units/hr  Will f/u 8 hr heparin level Daily heparin level and CBC  Sherlon Handing, PharmD, BCPS Clinical pharmacist, pager [phone] 12/13/2014,7:41 AM

## 2014-12-13 NOTE — H&P (View-Only) (Signed)
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  The pain started getting worse 2 weeks ago. Typically starts in his stomach - typical for him. Radiated to his intrascapular region   Objective:    Vital Signs:   Temp:  [97.8 F (36.6 C)-98.3 F (36.8 C)] 98.3 F (36.8 C) (07/15 0807) Pulse Rate:  [66-84] 68 (07/15 0807) Resp:  [13-20] 17 (07/15 0807) BP: (125-148)/(67-80) 129/73 mmHg (07/15 0807) SpO2:  [95 %-97 %] 95 % (07/15 0807) Weight:  [103.556 kg (228 lb 4.8 oz)-105.235 kg (232 lb)] 103.556 kg (228 lb 4.8 oz) (07/15 0807)  Last BM Date: 12/12/14   24-hour weight change: Weight change:   Weight trends: Filed Weights   12/13/14 0416 12/13/14 0807  Weight: 105.235 kg (232 lb) 103.556 kg (228 lb 4.8 oz)    Intake/Output:        Physical Exam: BP 129/73 mmHg  Pulse 68  Temp(Src) 98.3 F (36.8 C) (Oral)  Resp 17  Ht 5\' 9"  (1.753 m)  Wt 103.556 kg (228 lb 4.8 oz)  BMI 33.70 kg/m2  SpO2 95%  Wt Readings from Last 3 Encounters:  12/13/14 103.556 kg (228 lb 4.8 oz)  12/03/14 105.235 kg (232 lb)  10/22/14 104.835 kg (231 lb 1.9 oz)    General: Vital signs reviewed and noted.   Head: Normocephalic, atraumatic.  Eyes: conjunctivae/corneas clear.  EOM's intact.   Throat: normal  Neck:  normal   Lungs:    clear   Heart:  RR   Abdomen:  Soft, non-tender, non-distended    Extremities: Good pulses , no clubbing    Neurologic: A&O X3, CN II - XII are grossly intact.   Psych: Normal      Labs: BMET:  Recent Labs  12/13/14 0423  NA 140  K 4.9  CL 109  CO2 26  GLUCOSE 132*  BUN 32*  CREATININE 2.61*  CALCIUM 9.2    Liver function tests: No results for  input(s): AST, ALT, ALKPHOS, BILITOT, PROT, ALBUMIN in the last 72 hours. No results for input(s): LIPASE, AMYLASE in the last 72 hours.  CBC:  Recent Labs  12/13/14 0423  WBC 7.9  HGB 13.8  HCT 41.9  MCV 86.7  PLT 177    Cardiac Enzymes: No results for input(s): CKTOTAL, CKMB, TROPONINI in the last 72 hours.  Coagulation Studies:  Recent Labs  12/13/14 0423  LABPROT 12.5  INR 0.92    Other: Invalid input(s): POCBNP No results for input(s): DDIMER in the last 72 hours. No results for input(s): HGBA1C in the last 72 hours. No results for input(s): CHOL, HDL, LDLCALC, TRIG, CHOLHDL in the last 72 hours. No results for input(s): TSH, T4TOTAL, T3FREE, THYROIDAB in the last 72 hours.  Invalid input(s): FREET3 No results for input(s): VITAMINB12, FOLATE, FERRITIN, TIBC, IRON, RETICCTPCT in the last 72 hours.   Other results:  EKG  ( personally reviewed )  -  NSR with new ST depression in the ant. Lat leads., occasional     Medications:    Infusions: . heparin 1,300 Units/hr (12/13/14 0552)    Scheduled Medications: . allopurinol  100 mg Oral QHS  . amLODipine  5 mg Oral Daily  . aspirin EC  81 mg Oral q morning - 10a  . atorvastatin  10 mg Oral Daily  . carvedilol  12.5 mg Oral BID WC  . insulin aspart  0-5 Units Subcutaneous QHS  . insulin aspart  0-9 Units Subcutaneous TID WC  . magnesium oxide  400 mg Oral BID  . multivitamin with minerals  1 tablet Oral q morning - 10a  . pantoprazole  40 mg Oral Daily  . sodium bicarbonate  325 mg Oral BID    Assessment/ Plan:   Principal Problem:   NSTEMI (non-ST elevated myocardial infarction) Active Problems:   Diabetes mellitus with nephropathy   Mixed hyperlipidemia   Renal artery stenosis    Shortness of breath   Coronary artery disease   Chronic kidney disease (CKD), stage IV (severe)   Hypertensive heart disease without CHF  1.  Unstable angina:  Patient presents with long-standing history of coronary  artery disease. He now presents with progressive unstable angina symptoms. His EKG shows ST segment depression that is new from several months ago.  He has significant renal insufficiency and we will need to hydrate him prior to the cardiac catheterization. We will also check a basic medical profile later this afternoon. We will hopefully be able to proceed with heart cath  if Dr. Irish Lack is ok with the Creatinine.   2. Diabetes Mellitus :    3. CKD :    4.  COPD :      Disposition: possible cath today   Length of Stay: 0  Thayer Headings, Brooke Bonito., MD, [facility] 12/13/2014, 9:40 AM Office [phone] Pager [phone]

## 2014-12-13 NOTE — Progress Notes (Signed)
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  The pain started getting worse 2 weeks ago. Typically starts in his stomach - typical for him. Radiated to his intrascapular region   Objective:    Vital Signs:   Temp:  [97.8 F (36.6 C)-98.3 F (36.8 C)] 98.3 F (36.8 C) (07/15 0807) Pulse Rate:  [66-84] 68 (07/15 0807) Resp:  [13-20] 17 (07/15 0807) BP: (125-148)/(67-80) 129/73 mmHg (07/15 0807) SpO2:  [95 %-97 %] 95 % (07/15 0807) Weight:  [103.556 kg (228 lb 4.8 oz)-105.235 kg (232 lb)] 103.556 kg (228 lb 4.8 oz) (07/15 0807)  Last BM Date: 12/12/14   24-hour weight change: Weight change:   Weight trends: Filed Weights   12/13/14 0416 12/13/14 0807  Weight: 105.235 kg (232 lb) 103.556 kg (228 lb 4.8 oz)    Intake/Output:        Physical Exam: BP 129/73 mmHg  Pulse 68  Temp(Src) 98.3 F (36.8 C) (Oral)  Resp 17  Ht 5\' 9"  (1.753 m)  Wt 103.556 kg (228 lb 4.8 oz)  BMI 33.70 kg/m2  SpO2 95%  Wt Readings from Last 3 Encounters:  12/13/14 103.556 kg (228 lb 4.8 oz)  12/03/14 105.235 kg (232 lb)  10/22/14 104.835 kg (231 lb 1.9 oz)    General: Vital signs reviewed and noted.   Head: Normocephalic, atraumatic.  Eyes: conjunctivae/corneas clear.  EOM's intact.   Throat: normal  Neck:  normal   Lungs:    clear   Heart:  RR   Abdomen:  Soft, non-tender, non-distended    Extremities: Good pulses , no clubbing    Neurologic: A&O X3, CN II - XII are grossly intact.   Psych: Normal      Labs: BMET:  Recent Labs  12/13/14 0423  NA 140  K 4.9  CL 109  CO2 26  GLUCOSE 132*  BUN 32*  CREATININE 2.61*  CALCIUM 9.2    Liver function tests: No results for  input(s): AST, ALT, ALKPHOS, BILITOT, PROT, ALBUMIN in the last 72 hours. No results for input(s): LIPASE, AMYLASE in the last 72 hours.  CBC:  Recent Labs  12/13/14 0423  WBC 7.9  HGB 13.8  HCT 41.9  MCV 86.7  PLT 177    Cardiac Enzymes: No results for input(s): CKTOTAL, CKMB, TROPONINI in the last 72 hours.  Coagulation Studies:  Recent Labs  12/13/14 0423  LABPROT 12.5  INR 0.92    Other: Invalid input(s): POCBNP No results for input(s): DDIMER in the last 72 hours. No results for input(s): HGBA1C in the last 72 hours. No results for input(s): CHOL, HDL, LDLCALC, TRIG, CHOLHDL in the last 72 hours. No results for input(s): TSH, T4TOTAL, T3FREE, THYROIDAB in the last 72 hours.  Invalid input(s): FREET3 No results for input(s): VITAMINB12, FOLATE, FERRITIN, TIBC, IRON, RETICCTPCT in the last 72 hours.   Other results:  EKG  ( personally reviewed )  -  NSR with new ST depression in the ant. Lat leads., occasional     Medications:    Infusions: . heparin 1,300 Units/hr (12/13/14 0552)    Scheduled Medications: . allopurinol  100 mg Oral QHS  . amLODipine  5 mg Oral Daily  . aspirin EC  81 mg Oral q morning - 10a  . atorvastatin  10 mg Oral Daily  . carvedilol  12.5 mg Oral BID WC  . insulin aspart  0-5 Units Subcutaneous QHS  . insulin aspart  0-9 Units Subcutaneous TID WC  . magnesium oxide  400 mg Oral BID  . multivitamin with minerals  1 tablet Oral q morning - 10a  . pantoprazole  40 mg Oral Daily  . sodium bicarbonate  325 mg Oral BID    Assessment/ Plan:   Principal Problem:   NSTEMI (non-ST elevated myocardial infarction) Active Problems:   Diabetes mellitus with nephropathy   Mixed hyperlipidemia   Renal artery stenosis    Shortness of breath   Coronary artery disease   Chronic kidney disease (CKD), stage IV (severe)   Hypertensive heart disease without CHF  1.  Unstable angina:  Patient presents with long-standing history of coronary  artery disease. He now presents with progressive unstable angina symptoms. His EKG shows ST segment depression that is new from several months ago.  He has significant renal insufficiency and we will need to hydrate him prior to the cardiac catheterization. We will also check a basic medical profile later this afternoon. We will hopefully be able to proceed with heart cath  if Dr. Irish Lack is ok with the Creatinine.   2. Diabetes Mellitus :    3. CKD :    4.  COPD :      Disposition: possible cath today   Length of Stay: 0  Thayer Headings, Brooke Bonito., MD, [facility] 12/13/2014, 9:40 AM Office [phone] Pager [phone]

## 2014-12-13 NOTE — H&P (Addendum)
CARDIOLOGY INPATIENT HISTORY AND PHYSICAL EXAMINATION NOTE  Patient ID: Ronald [name] [facility] MRN: [id number], DOB/AGE: 1945-10-23   Admit date: 12/13/2014   Primary Physician: [name], MD Primary Cardiologist: Dr. Angelena Form Primary nephrologist: Dr. Worthy Flank nephrology\ Primary Physician: Dr. 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  Patient today presented with complaint of chest pain which woke him up from sleeping at 2 AM in the morning. He said that this pain was similar to his prior MIs. The pain was associated with SOB, fatigue, abdominal distention. He had similar symptoms in 2014 and 2010 when he presented with sharp chest pain for few seconds similar to this one. He does not want to take IV NTG since it causes him to have headaches. He is okay with the NTG patch. In 2010 he had cardiac cath performed which showed small diagonal branch as culprit after an abnormal stress test which showed lateral ischemia and was not revascularized due to lumen size.The small diagonal branch filled retrograde on angiogram. Patient said that he has been having chest pain for the last two weeks. 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 The pain is describing is dull and stayed for 1-2 hours radiated to left shoulder and neck. He felt SOB with it and felt dizzy as well. He lives with his wife and is very independent. He has never been to cardiac rehabilitation in the past.    Problem List: Past Medical History  Diagnosis Date  . CAD (coronary artery disease) 2007    a. s/p bypass and subsequent redo bypass in 07 b. Lexiscan Myoview 12/31/12: no  evidence of ischemia, EF 53% and mild septal HK as above   . Ischemic cardiomyopathy 2006    EF 40% to 50% by 2D echo in 2006;  Echo 12/31/12: Mild LVH, EF 50-55%, normal wall motion.   . Chronic kidney disease     Creatinine 1.9 to 2  . Hyperlipidemia   . Hypertension   . Renal artery stenosis   . Gout   . Emphysema   . COPD (chronic obstructive pulmonary disease)   . Diabetes mellitus     Diet control   . Arthritis   . GERD (gastroesophageal reflux disease)   . Esophageal stricture 07/02/1998    EGD  . Anginal pain   . Dysrhythmia     bigeminy pvcs  . CHF (congestive heart failure)     Past Surgical History  Procedure Laterality Date  . Coronary artery bypass graft  1996  . Coronary artery bypass graft  March 2007    Redo of CABG from 1996  . Lung repair  1996  . Cholecystectomy    . Angioplasty       Allergies:  Allergies  Allergen Reactions  . Ciprofloxacin     GI upset  . Hydrochlorothiazide Other (See Comments)    dehydrates Dehydration  . Hydrocodone     Stomach upset Stomach upset  . Hydrocodone-Acetaminophen     Stomach upset  . Hydrocodone-Acetaminophen Nausea Only  . Sulfa Antibiotics   . Sulfacetamide Sodium   . Penicillins Hives and Rash     Home Medications  Current Facility-Administered Medications  Medication Dose Route Frequency Provider Last Rate Last Dose  . heparin ADULT infusion 100 units/mL (25000 units/250 mL)  1,300 Units/hr Intravenous Continuous Franky Macho, RPH 13 mL/hr at 12/13/14 0552 1,300 Units/hr at 12/13/14 [id number]   Current Outpatient Prescriptions  Medication Sig Dispense Refill  . acetaminophen (TYLENOL) 325 MG tablet Take 650 mg by mouth every 6 (six) hours as needed.    [name] allopurinol (ZYLOPRIM) 100 MG tablet Take 1 tablet (100 mg total) by mouth at bedtime. 90 tablet 3  . amLODipine (NORVASC) 5 MG tablet Take 1 tablet (5 mg total) by mouth daily. 90 tablet 3  . aspirin 81 MG tablet Take 81 mg by mouth every morning.     [name]  atorvastatin (LIPITOR) 10 MG tablet Take 1 tablet (10 mg total) by mouth daily. 90 tablet 3  . carvedilol (COREG) 12.5 MG tablet Take 1 tablet (12.5 mg total) by mouth 2 (two) times daily with a meal. 90 tablet 3  . magnesium oxide (MAG-OX) 400 MG tablet Take 400 mg by mouth 2 (two) times daily.      . Multiple Vitamin (MULTIVITAMIN) tablet Take 1 tablet by mouth every morning.     . nitroGLYCERIN (NITROSTAT) 0.4 MG SL tablet Place 1 tablet (0.4 mg total) under the tongue every 5 (five) minutes as needed. May repeat up to three times. 25 tablet 6  . omeprazole (PRILOSEC) 20 MG capsule Take 1 capsule (20 mg total) by mouth 2 (two) times daily. 180 capsule 3  . sodium bicarbonate 325 MG tablet Take 325 mg by mouth 2 (two) times daily.     [name] loratadine (CLARITIN) 10 MG tablet Take 1 tablet (10 mg total) by mouth daily. (Patient not taking: Reported on 12/13/2014) 30 tablet 12     Family History  Problem Relation Age of Onset  . Heart attack Mother     MI  . Stroke Mother   . Heart disease Mother   . Hypertension Mother   . Hyperlipidemia Mother   . Heart disease Father   . Rheumatic fever Father      History   Social History  . Marital Status: Married    Spouse Name: N/A  . Number of Children: 2  . Years of Education: N/A   Occupational History  . Retired    Social History Main Topics  . Smoking status: Former Smoker -- 3.00 packs/day for 20 years    Quit date: 07/25/1986  . Smokeless tobacco: Never Used  . Alcohol Use: No  . Drug Use: No  . Sexual Activity: No   Other Topics Concern  . 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     Review of Systems: General: negative for chills, fever, night sweats or weight changes.  Cardiovascular: chest pain, dyspnea  Dermatological: negative for rash Respiratory: negative for cough or wheezing Urologic: negative for hematuria Abdominal: negative for nausea, vomiting, diarrhea, bright red blood per rectum,  melena, or hematemesis Neurologic: negative for visual changes, syncope, or dizziness  Physical Exam: Vitals: BP 148/80 mmHg  Pulse 73  Temp(Src) 97.8 F (36.6 C) (Oral)  Resp 13  Ht 5\' 9"  (1.753 m)  Wt 105.235 kg (232 lb)  BMI 34.24 kg/m2  SpO2 96% General: not in acute distress Neck: JVP flat, neck supple Heart: regular rate and rhythm, S1, S2, no murmurs  Lungs: CTAB  GI: non tender, non distended, bowel sounds present Extremities: 1+  bilateral edema Neuro: AAO x 3  Psych: normal affect, no anxiety   Labs:  No results found for this or any previous visit (from the past 24 hour(s)).   Radiology/Studies: Dg Chest 2 View  12/13/2014   CLINICAL DATA:  Chest pain for 2 weeks.  EXAM: CHEST  2 VIEW  COMPARISON:  12/30/2012  FINDINGS: Postoperative changes in the mediastinum. Shallow inspiration. Mild cardiac enlargement without vascular congestion. Central interstitial changes suggest chronic bronchitis. No focal airspace disease or consolidation in the lungs. No blunting of costophrenic angles. No pneumothorax.  IMPRESSION: Cardiac enlargement. Chronic bronchitic changes in the lungs. No evidence of active consolidation.   Electronically Signed   By: Lucienne Capers M.D.   On: 12/13/2014 05:01   Dg Lumbar Spine Complete  12/04/2014   CLINICAL DATA:  Back pain for 2 months  EXAM: LUMBAR SPINE - COMPLETE 4+ VIEW  COMPARISON:  12/30/2011  FINDINGS: Five views of lumbar spine submitted. There is diffuse osteopenia. Mild disc space flattening at L3-L4 and L4-L5 level. Facet degenerative changes are noted at L4 and L5 level. There is about 6 mm anterolisthesis L4 on L5 vertebral body. No acute fracture.  IMPRESSION: No acute fracture. There is about 6 mm anterolisthesis L4 on L5 vertebral body. Mild disc space flattening at L3-L4 and L4-L5 level. Facet degenerative changes L4 and L5 level.   Electronically Signed   By: Lahoma Crocker M.D.   On: 12/04/2014 16:13   Dg Hips Bilat With Pelvis  2v  12/04/2014   CLINICAL DATA:  Chronic bilateral hip pain, no known injury  EXAM: BILATERAL HIP (WITH PELVIS) 2 VIEWS  COMPARISON:  None.  FINDINGS: Five views bilateral hip submitted. No acute fracture or subluxation. Mild spurring of right superior acetabulum. Bilateral hip joints are symmetrical in appearance.  IMPRESSION: No acute fracture or subluxation. Mild spurring of right superior acetabulum. Bilateral hip joints are symmetrical in appearance.   Electronically Signed   By: Lahoma Crocker M.D.   On: 12/04/2014 16:11    EKG: 12/13/2014 NSR, 70 bpm, ST depression in lateral leads, PVC present  Echo: 12/31/2012 Mild LVH, LVEF 50%, no valvular disease, low normal LVEF  Stress test nuclear scan 12/31/2012 1. No evidence of myocardial ischemia or infarction. 2. Mild septal hypokinesis. Left ventricular wall motion is otherwise normal. 3. Estimated Q G S ejection fraction 53%  Cardiac cath:  cath performed in 2010 for chest pain revealing patent IMA-LAD, VG-RCA, RV-OM and CAD in small diagonal branch identified as the culprit lesion.   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    Principal Problem:   NSTEMI (non-ST elevated myocardial infarction) - elevated trop - will recycle trop - will probably need to discuss need for cardiac cath in the setting of renal failure - aggressive nephroprotective care w/acetylcysteine/IV fluids and low dose iodine contrast use - IV heparin started - continue aspirin, statin - obtain echo in the  AM - hbA1c, TSH, lipid panel  Active Problems:   Diabetes mellitus with nephropathy SSI, CBG hba1c  Mixed hyperlipidemia Elevated LDL 143    Renal artery stenosis  No new changes    Shortness of breath Oxygen prn    Coronary artery disease Aspirin, bb, statin, ACei Echo in AM    Chronic kidney disease (CKD), stage IV (severe) Discuss with nephrologist regarding risk of contrast nephropathy    Hypertensive heart disease without CHF Continue home meds  Constipation On laxatives, took last day before yesterday    Signed, Flossie Dibble, MD MS 12/13/2014, 6:41 AM

## 2014-12-13 NOTE — Interval H&P Note (Signed)
Cath Lab Visit (complete for each Cath Lab visit)  Clinical Evaluation Leading to the Procedure:   ACS: Yes.    Non-ACS:    Anginal Classification: CCS IV  Anti-ischemic medical therapy: Maximal Therapy (2 or more classes of medications)  Non-Invasive Test Results: No non-invasive testing performed  Prior CABG: CABG x 2   TIMI Score  Patient Information:  TIMI Score is 5  Revascularization of the presumed culprit artery  A (9)  Indication: 11; Score: 9 TIMI Score  Patient Information:  TIMI Score is 5  Revascularization of multiple coronary arteries when the culprit artery cannot clearly be determined  A (9)  Indication: 12; Score: 9    History and Physical Interval Note:  12/13/2014 4:27 PM  Ronald [name]  has presented today for surgery, with the diagnosis of Nstemi  The various methods of treatment have been discussed with the patient and family. After consideration of risks, benefits and other options for treatment, the patient has consented to  Procedure(s): Left Heart Cath and Coronary Angiography (N/A) as a surgical intervention .  The patient's history has been reviewed, patient examined, no change in status, stable for surgery.  I have reviewed the patient's chart and labs.  Questions were answered to the patient's satisfaction.     Angelice Piech S.

## 2014-12-14 ENCOUNTER — Encounter (HOSPITAL_COMMUNITY): Payer: Self-pay | Admitting: Physician Assistant

## 2014-12-14 LAB — BASIC METABOLIC PANEL
ANION GAP: 7 (ref 5–15)
BUN: 24 mg/dL — AB (ref 6–20)
CALCIUM: 9 mg/dL (ref 8.9–10.3)
CHLORIDE: 111 mmol/L (ref 101–111)
CO2: 22 mmol/L (ref 22–32)
CREATININE: 1.98 mg/dL — AB (ref 0.61–1.24)
GFR calc Af Amer: 37 mL/min — ABNORMAL LOW (ref >60)
GFR calc non Af Amer: 32 mL/min — ABNORMAL LOW (ref >60)
Glucose, Bld: 156 mg/dL — ABNORMAL HIGH (ref 65–99)
Potassium: 4.4 mmol/L (ref 3.5–5.1)
Sodium: 140 mmol/L (ref 135–145)

## 2014-12-14 LAB — HEMOGLOBIN A1C
Hgb A1c MFr Bld: 6.2 % — ABNORMAL HIGH (ref 4.8–5.6)
MEAN PLASMA GLUCOSE: 131 mg/dL

## 2014-12-14 LAB — CBC
HCT: 40.6 % (ref 39.0–52.0)
Hemoglobin: 13.5 g/dL (ref 13.0–17.0)
MCH: 28.8 pg (ref 26.0–34.0)
MCHC: 33.3 g/dL (ref 30.0–36.0)
MCV: 86.8 fL (ref 78.0–100.0)
PLATELETS: 143 10*3/uL — AB (ref 150–400)
RBC: 4.68 MIL/uL (ref 4.22–5.81)
RDW: 14.3 % (ref 11.5–15.5)
WBC: 7.6 10*3/uL (ref 4.0–10.5)

## 2014-12-14 LAB — GLUCOSE, CAPILLARY: Glucose-Capillary: 97 mg/dL (ref 65–99)

## 2014-12-14 MED ORDER — TICAGRELOR 90 MG PO TABS: 90.0000 mg | ORAL_TABLET | Freq: Two times a day (BID) | ORAL | Status: DC

## 2014-12-14 NOTE — Progress Notes (Signed)
CARDIAC REHAB PHASE I   PRE:  Rate/Rhythm: 67 SR w/ PVCs, bigeminy  BP:  Supine: 145/80 Sitting:   Standing:    SaO2: 98% RA  MODE:  Ambulation: 600 ft   POST:  Rate/Rhythm: 80  BP:  Supine:   Sitting: 166/78  Standing:    SaO2: 97% RA  [id number] Pt tolerated ambulation well with assist x1, gait steady, no c/o, VSS. To bed after walk, MI/PCI education complete including Brilinta, ASA use, CP, NTG use and calling 911, heart healthy diet sheet and MI book given. Exercise guidelines given. Discussed Phase 2 cardiac rehab and permission given to send contact information to [facility]. Pt verbalizes understanding of instructions given.  Sol Passer, MS, ACSM CCEP

## 2014-12-14 NOTE — Progress Notes (Signed)
SUBJECTIVE:  No chest pain.  Feels much better.  Wants to go home.  OBJECTIVE:   Vitals:   Filed Vitals:   12/14/14 0100 12/14/14 0354 12/14/14 0645 12/14/14 0746  BP: 146/55 162/73 150/71 154/71  Pulse:  101  69  Temp:  98 F (36.7 C)  98.3 F (36.8 C)  TempSrc:  Oral  Oral  Resp:  16  16  Height:      Weight:  226 lb 6.6 oz (102.7 kg)    SpO2:  97%  98%   I&O's:   Intake/Output Summary (Last 24 hours) at 12/14/14 [id number] Last data filed at 12/14/14 0746  Gross per 24 hour  Intake    745 ml  Output    950 ml  Net   -205 ml   TELEMETRY: Reviewed telemetry pt in NSR:     PHYSICAL EXAM General: Well developed, well nourished, in no acute distress Head:   Normal cephalic and atramatic  Lungs:   Clear bilaterally to auscultation. Heart:   HRRR S1 S2  No JVD.   Abdomen: abdomen soft and non-tender Msk:  Back normal,  Normal strength and tone for age. Extremities:   No edema.  Right groin bruised, tr right DP pulse Neuro: Alert and oriented. Psych:  Normal affect, responds appropriately Skin: No rash   LABS: Basic Metabolic Panel:  Recent Labs  12/13/14 1430 12/14/14 0220  NA 141 140  K 4.4 4.4  CL 110 111  CO2 23 22  GLUCOSE 142* 156*  BUN 28* 24*  CREATININE 2.22* 1.98*  CALCIUM 9.0 9.0   Liver Function Tests: No results for input(s): AST, ALT, ALKPHOS, BILITOT, PROT, ALBUMIN in the last 72 hours. No results for input(s): LIPASE, AMYLASE in the last 72 hours. CBC:  Recent Labs  12/13/14 0423 12/14/14 0220  WBC 7.9 7.6  HGB 13.8 13.5  HCT 41.9 40.6  MCV 86.7 86.8  PLT 177 143*   Cardiac Enzymes:  Recent Labs  12/13/14 1116 12/13/14 1430 12/13/14 2147  TROPONINI 1.05* 1.05* 3.09*   BNP: Invalid input(s): POCBNP D-Dimer: No results for input(s): DDIMER in the last 72 hours. Hemoglobin A1C:  Recent Labs  12/13/14 0423  HGBA1C 6.2*   Fasting Lipid Panel:  Recent Labs  12/13/14 0423  CHOL 135  HDL 30*  LDLCALC 80  TRIG 124    CHOLHDL 4.5   Thyroid Function Tests:  Recent Labs  12/13/14 1116  TSH 1.472   Anemia Panel: No results for input(s): VITAMINB12, FOLATE, FERRITIN, TIBC, IRON, RETICCTPCT in the last 72 hours. Coag Panel:   Lab Results  Component Value Date   INR 1.04 12/13/2014   INR 0.92 12/13/2014   INR 0.9 08/19/2008    RADIOLOGY: Dg Chest 2 View  12/13/2014   CLINICAL DATA:  Chest pain for 2 weeks.  EXAM: CHEST  2 VIEW  COMPARISON:  12/30/2012  FINDINGS: Postoperative changes in the mediastinum. Shallow inspiration. Mild cardiac enlargement without vascular congestion. Central interstitial changes suggest chronic bronchitis. No focal airspace disease or consolidation in the lungs. No blunting of costophrenic angles. No pneumothorax.  IMPRESSION: Cardiac enlargement. Chronic bronchitic changes in the lungs. No evidence of active consolidation.   Electronically Signed   By: Lucienne Capers M.D.   On: 12/13/2014 05:01   Dg Lumbar Spine Complete  12/04/2014   CLINICAL DATA:  Back pain for 2 months  EXAM: LUMBAR SPINE - COMPLETE 4+ VIEW  COMPARISON:  12/30/2011  FINDINGS: Five views of  lumbar spine submitted. There is diffuse osteopenia. Mild disc space flattening at L3-L4 and L4-L5 level. Facet degenerative changes are noted at L4 and L5 level. There is about 6 mm anterolisthesis L4 on L5 vertebral body. No acute fracture.  IMPRESSION: No acute fracture. There is about 6 mm anterolisthesis L4 on L5 vertebral body. Mild disc space flattening at L3-L4 and L4-L5 level. Facet degenerative changes L4 and L5 level.   Electronically Signed   By: Lahoma Crocker M.D.   On: 12/04/2014 16:13   Dg Hips Bilat With Pelvis 2v  12/04/2014   CLINICAL DATA:  Chronic bilateral hip pain, no known injury  EXAM: BILATERAL HIP (WITH PELVIS) 2 VIEWS  COMPARISON:  None.  FINDINGS: Five views bilateral hip submitted. No acute fracture or subluxation. Mild spurring of right superior acetabulum. Bilateral hip joints are symmetrical in  appearance.  IMPRESSION: No acute fracture or subluxation. Mild spurring of right superior acetabulum. Bilateral hip joints are symmetrical in appearance.   Electronically Signed   By: Lahoma Crocker M.D.   On: 12/04/2014 16:11      ASSESSMENT: s/p PCI for NSTEMI, renal insufficiency   PLAN:  Continue aspirin and Brilinta for a year without interruption.  Plan discharge later oday.  Continue anti HTN and lipid lowering meds.  Cardiac rehab recommended.  Jettie Booze, MD  12/14/2014  8:33 AM

## 2014-12-14 NOTE — Discharge Summary (Signed)
Discharge Summary   Patient ID: Ronald [name] MRN: [id number], DOB/AGE: 1946-05-07 69 y.o. Admit date: 12/13/2014 D/C date:     12/14/2014  Primary Care Provider: [name], MD Primary Cardiologist: [facility]  Primary Discharge Diagnoses:  1. CAD with NSTEMI this admission - history of CABG- IMA-LAD, VG-Cx, VG-RCA, VG-diag in 1999, redo CABG- VG-OM, VG-RCA in 2007 due to VG diseas - this admission: s/p DES to SVG-OM from the Y graft 2. Ischemic cardiomyopathy 3. [name] 4. HTN 5. Hyperlipdemia 6. DM with diabetic nephropathy, traditionally diet controlled  PMH:  Past Medical History  Diagnosis Date  . CAD (coronary artery disease) 2007    a. s/p CABG- IMA-LAD, VG-Cx, VG-RCA, VG-diag in 1999. B. sp redo CABG- VG-OM, VG-RCA in 2007 due to VG disease. c. NSTEMI 11/2014 s/p DES to SVG-OM from the Y graft.  . Ischemic cardiomyopathy 2006    EF 40% to 50% by 2D echo in 2006;  Echo 12/31/12: Mild LVH, EF 50-55%, normal wall motion.   . CKD (chronic kidney disease), stage IV     Creatinine 1.9 to 2.2  . Hyperlipidemia   . Hypertension   . Renal artery stenosis   . Emphysema   . COPD (chronic obstructive pulmonary disease)   . GERD (gastroesophageal reflux disease)   . Esophageal stricture 07/02/1998    EGD  . Dysrhythmia     bigeminy pvcs  . CHF (congestive heart failure)   . History of hiatal hernia   . Walking pneumonia 1990's  . Type II diabetes mellitus     Diet control   . History of gout     "last flareup was in 2007" (12/13/2014)  . Chronic lower back pain   . Arthritis     "hips; back" (12/13/2014)  . Degenerative disc disease, lumbar   . Deafness in left ear      Hospital Course: [name] is a 69 y/o M with CAD (s/p CABG- IMA-LAD, VG-Cx, VG-RCA, VG-diag in 1999, redo CABG- VG-OM, VG-RCA in 2007 due to VG disease), ischemic cardiomyopathy (EF 40-50%), CKD (stage IV), type 2 DM with diabetic nephropathy, HTN, HLD, RAS, COPD, GERD, h/o esophageal stricture  and IBS who presented to [facility] 12/13/2014 with chest pain similar to prior MIs for the last 2 weeks, associated with SOB and dizziness. In 2010 he had cardiac cath performed which showed small diagonal branch as culprit after an abnormal stress test which showed lateral ischemia and was not revascularized due to lumen size.The small diagonal branch filled retrograde on angiogram. On day of admission the discomfort woke him from sleep, prompting ER visit. He ruled in for MI with Cr peak of 3. Admit Cr was 2.6 (previously 2-2.3 as an outpatient by prior labs this year). His EKG showed ST segment depression that was new from several months ago. He was placed on IV heparin and was hydrated prior to cath. Pre-cath creatinine was 2.22. He underwent cath yesterday showing Insertion lesion, 99% stenosed at the SVG to OM anastomosis from the Y graft. This was treated with a 3.5 x 12 Synergy drug-eluting stent. Recommendation was to continue DAPT for at least a year and continue aggressive secondary prevention. Post-cath Cr was stable at 1.98. LDL 80 this admission. Dr. Irish Lack did not make any change to the statin this admission but this could be considered in follow-up. The patient did well overnight. Dr. Irish Lack has seen and examined the patient today and feels he is stable  for discharge. I have sent a message to our [facility] office's scheduler requesting a follow-up appointment, and our office will call the patient with this information. He received 30 day free Brilinta card along with regular refills.  Discharge Vitals: Blood pressure 154/71, pulse 69, temperature 98.3 F (36.8 C), temperature source Oral, resp. rate 16, height 5\' 9"  (1.753 m), weight 226 lb 6.6 oz (102.7 kg), SpO2 98 %.  Labs: Lab Results  Component Value Date   WBC 7.6 12/14/2014   HGB 13.5 12/14/2014   HCT 40.6 12/14/2014   MCV 86.8 12/14/2014   PLT 143* 12/14/2014    Recent Labs Lab 12/14/14 0220  NA 140  K 4.4  CL  111  CO2 22  BUN 24*  CREATININE 1.98*  CALCIUM 9.0  GLUCOSE 156*    Recent Labs  12/13/14 1116 12/13/14 1430 12/13/14 2147  TROPONINI 1.05* 1.05* 3.09*   Lab Results  Component Value Date   CHOL 135 12/13/2014   HDL 30* 12/13/2014   LDLCALC 80 12/13/2014   TRIG 124 12/13/2014     Diagnostic Studies/Procedures   Dg Chest 2 View  12/13/2014   CLINICAL DATA:  Chest pain for 2 weeks.  EXAM: CHEST  2 VIEW  COMPARISON:  12/30/2012  FINDINGS: Postoperative changes in the mediastinum. Shallow inspiration. Mild cardiac enlargement without vascular congestion. Central interstitial changes suggest chronic bronchitis. No focal airspace disease or consolidation in the lungs. No blunting of costophrenic angles. No pneumothorax.  IMPRESSION: Cardiac enlargement. Chronic bronchitic changes in the lungs. No evidence of active consolidation.   Electronically Signed   By: Lucienne Capers M.D.   On: 12/13/2014 05:01   Cardiac catheterization this admission, please see full report and above for summary.   Discharge Medications   Current Discharge Medication List    START taking these medications   Details  ticagrelor (BRILINTA) 90 MG TABS tablet Take 1 tablet (90 mg total) by mouth 2 (two) times daily. Qty: 60 tablet, Refills: 11      CONTINUE these medications which have NOT CHANGED   Details  acetaminophen (TYLENOL) 325 MG tablet Take 650 mg by mouth every 6 (six) hours as needed.   Associated Diagnoses: Low back pain, unspecified back pain laterality, with sciatica presence unspecified    allopurinol (ZYLOPRIM) 100 MG tablet Take 1 tablet (100 mg total) by mouth at bedtime.     amLODipine (NORVASC) 5 MG tablet Take 1 tablet (5 mg total) by mouth daily.    Associated Diagnoses: Essential hypertension    aspirin 81 MG tablet Take 81 mg by mouth every morning.     atorvastatin (LIPITOR) 10 MG tablet Take 1 tablet (10 mg total) by mouth daily.    Associated Diagnoses:  Hyperlipidemia    carvedilol (COREG) 12.5 MG tablet Take 1 tablet (12.5 mg total) by mouth 2 (two) times daily with a meal.     magnesium oxide (MAG-OX) 400 MG tablet Take 400 mg by mouth 2 (two) times daily.      Multiple Vitamin (MULTIVITAMIN) tablet Take 1 tablet by mouth every morning.     nitroGLYCERIN (NITROSTAT) 0.4 MG SL tablet Place 1 tablet (0.4 mg total) under the tongue every 5 (five) minutes as needed. May repeat up to three times.     omeprazole (PRILOSEC) 20 MG capsule Take 1 capsule (20 mg total) by mouth 2 (two) times daily.    Associated Diagnoses: Gastroesophageal reflux disease, esophagitis presence not specified    sodium bicarbonate 325  MG tablet Take 325 mg by mouth 2 (two) times daily.       STOP taking these medications     loratadine (CLARITIN) 10 MG tablet - not taking prior to admission         Disposition   The patient will be discharged in stable condition to home. Discharge Instructions    Diet - low sodium heart healthy    Complete by:  As directed      Increase activity slowly    Complete by:  As directed   No driving for 1 week. No lifting over 10 lbs for 2 weeks. No sexual activity for 2 weeks. Keep procedure site clean & dry. If you notice increased pain, swelling, bleeding or pus, call/return!  You may shower, but no soaking baths/hot tubs/pools for 1 week.          Follow-up Information    Follow up with Lauree Chandler, MD.   Specialty:  Cardiology   Why:  Office will call you for your followup appointment. Call office if you have not heard back in 3 days.   Contact information:   [address] [place] [address] [phone]         Duration of Discharge Encounter: Greater than 30 minutes including physician and PA time.  Signed, Lisbeth Renshaw, Dunn PA-C 12/14/2014, 9:08 AM    I have examined the patient and reviewed assessment and plan and discussed with patient.  Agree with above as stated.  S/p PCI for  NSTEMI.  Renal function better today.  No CP.  He walked without issues with rehab. Mild bruising in right groin.  D/C later today.  Heily Carlucci S.

## 2014-12-16 ENCOUNTER — Telehealth: Payer: Self-pay | Admitting: Cardiovascular Disease

## 2014-12-16 ENCOUNTER — Encounter (HOSPITAL_COMMUNITY): Payer: Self-pay | Admitting: Interventional Cardiology

## 2014-12-16 ENCOUNTER — Telehealth: Payer: Self-pay | Admitting: Cardiology

## 2014-12-16 MED FILL — Heparin Sodium (Porcine) 2 Unit/ML in Sodium Chloride 0.9%: INTRAMUSCULAR | Qty: 1500 | Status: AC

## 2014-12-16 MED FILL — Nitroglycerin IV Soln 100 MCG/ML in D5W: INTRA_ARTERIAL | Qty: 10 | Status: AC

## 2014-12-16 NOTE — Telephone Encounter (Signed)
New message      Pt had a cath last Friday.  Pt had a stent.  He could not afford brilinta so his wife started giving him 75mg  of plavix. Please call and let them know if he can take plavix instead of birlinta

## 2014-12-16 NOTE — Telephone Encounter (Signed)
TCM per staff message: 7/25 @ 3:30 w/ Mickel Baas

## 2014-12-16 NOTE — Telephone Encounter (Signed)
Spoke with pt's wife. She reports they have free 30 day card but Brilinta will cost them over $200 per month after this and they cannot afford. Did not fill Brilinta and wife gave pt Plavix 75 mg on Saturday and Sunday night. I instructed wife to fill Brilinta with free card today and told her I would leave samples of Brilinta at front desk.  Will also leave assistance program information at front desk.  Wife made aware of appt with Cecilie Kicks, NP on December 23, 2014 at 3:30 and she will discuss concerns regarding medication with provider at this visit.  Will also pick up samples at that time.   Wife reports pt was discharged on Saturday and start "feeling bad again" on Saturday evening.  Reports twinge of chest pain on Saturday but none since. Felt better yesterday. Main complaint is feeling tired.  No shortness of breath but does report he woke up Saturday night "gasping for air".  Once awake felt better.  States this also happened when in the hospital.  Wife reports pt does snore.  He is not having symptoms like he had prior to going to hospital.  I told wife to continue to monitor symptoms and if fatigue worsens or he develops shortness of breath or chest pain to let us know.  If severe he should go to the hospital.  Brilinta 90 mg,32 tablets,  lot [id number], exp 11/18 left at front desk

## 2014-12-16 NOTE — Telephone Encounter (Signed)
Left message to call back  

## 2014-12-17 NOTE — Telephone Encounter (Signed)
Appt made for pt to see Ignacia Bayley, NP on December 18, 2014.  (see previous phone note)

## 2014-12-17 NOTE — Telephone Encounter (Signed)
Left message to call back for TCM follow-up call.

## 2014-12-17 NOTE — Telephone Encounter (Signed)
Pt was called earlier today for TCM call and complained of shortness of breath and twinges of chest pain.  (see phone note).  Asking if appt needed sooner than 7/25.  I reviewed with Dr. Ron Parker who would like pt to see flex provider tomorrow.  Appt made for pt to see Ignacia Bayley, NP on December 18, 2014 at 3:30.  I spoke with pt's wife and made her aware of appt.  Wife aware if symptoms worsen pt should go to ED.

## 2014-12-17 NOTE — Telephone Encounter (Signed)
Patient contacted regarding discharge from [facility] on 12/14/14.  Patient understands to follow up with provider Cecilie Kicks NP on 12/23/14 at 3:30 PM at [facility]. Patient understands discharge instructions? yes Patient understands medications and regiment? yes Patient understands to bring all medications to this visit? yes  Pt and wife were both on the phone and wanted to let Dr Angelena Form and nurse know that the pt is still experiencing some mild sob, with intermittent "twinges" of chest pain.  Pt states he is currently not experiencing any cp, palpitations, or dizziness at this time. Pt states that the chest "twinges" occurred only once today, while he was walking his dog in the heat.  Pt does state that he still has some mild sob, especially when exerting/walking to and from different locations within his home.  Pt states he has had no pre-syncopal or syncopal episodes. When listening to the pt on the phone, he was able to speak in full complete sentences with no difficulties.  Pts wife states she spoke about the pts complaints with Dr Camillia Herter nurse yesterday, and the pt still feels about the same.  Both the pt and wife feel like he is not acute enough to defer to the ER at this time.  Pt has not taken any nitro's when experiencing his episodes of chest pain "twinges."  Pts wife would like for Dr Angelena Form and nurse to review this message and decide if the pt needs to be seen sooner than 7/25 appt or should the pt try a different medication regimen.  Informed both the pt and wife that I will speak with Dr Camillia Herter nurse about this, and someone will follow-up soon thereafter with any further recommendations.  Advised the pt and wife that if the pt becomes acutely distressed and has to take more than 2 nitroglycerin to relieve his chest pain, then he should see immediate medical attention.  Both verbalized understanding and agrees with this plan.

## 2014-12-18 ENCOUNTER — Encounter: Payer: Self-pay | Admitting: Nurse Practitioner

## 2014-12-18 ENCOUNTER — Ambulatory Visit (INDEPENDENT_AMBULATORY_CARE_PROVIDER_SITE_OTHER): Payer: Medicare Other | Admitting: Nurse Practitioner

## 2014-12-18 VITALS — BP 102/60 | HR 71 | Ht 69.0 in | Wt 225.6 lb

## 2014-12-18 DIAGNOSIS — I25700 Atherosclerosis of coronary artery bypass graft(s), unspecified, with unstable angina pectoris: Secondary | ICD-10-CM

## 2014-12-18 DIAGNOSIS — I222 Subsequent non-ST elevation (NSTEMI) myocardial infarction: Secondary | ICD-10-CM | POA: Diagnosis not present

## 2014-12-18 DIAGNOSIS — I214 Non-ST elevation (NSTEMI) myocardial infarction: Secondary | ICD-10-CM

## 2014-12-18 DIAGNOSIS — I10 Essential (primary) hypertension: Secondary | ICD-10-CM

## 2014-12-18 DIAGNOSIS — E785 Hyperlipidemia, unspecified: Secondary | ICD-10-CM | POA: Diagnosis not present

## 2014-12-18 MED ORDER — CLOPIDOGREL BISULFATE 75 MG PO TABS: 75.0000 mg | ORAL_TABLET | Freq: Every day | ORAL | Status: DC

## 2014-12-18 NOTE — Telephone Encounter (Signed)
Agree. thanks

## 2014-12-18 NOTE — Progress Notes (Signed)
Patient Name: Ronald [name] [facility] Date of Encounter: 12/18/2014  Primary Care Provider:  [name], MD Primary Cardiologist:  C. Angelena Form, MD   Chief Complaint  69 year old male status post recent non-ST elevation MI who presents for follow-up.  Past Medical History   Past Medical History  Diagnosis Date  . CAD (coronary artery disease) 2007    a. s/p CABG- IMA-LAD, VG-Cx, VG-RCA, VG-diag in 1999. B. sp redo CABG- VG-OM, VG-RCA in 2007 due to VG disease. c. NSTEMI 11/2014 s/p DES to SVG-OM from the Y graft.  . Ischemic cardiomyopathy 2006    EF 40% to 50% by 2D echo in 2006;  Echo 12/31/12: Mild LVH, EF 50-55%, normal wall motion.   . CKD (chronic kidney disease), stage IV     Creatinine 1.9 to 2.2  . Hyperlipidemia   . Hypertension   . Renal artery stenosis   . Emphysema   . COPD (chronic obstructive pulmonary disease)   . GERD (gastroesophageal reflux disease)   . Esophageal stricture 07/02/1998    EGD  . Dysrhythmia     bigeminy pvcs  . CHF (congestive heart failure)   . History of hiatal hernia   . Walking pneumonia 1990's  . Type II diabetes mellitus     Diet control   . History of gout     "last flareup was in 2007" (12/13/2014)  . Chronic lower back pain   . Arthritis     "hips; back" (12/13/2014)  . Degenerative disc disease, lumbar   . Deafness in left ear    Past Surgical History  Procedure Laterality Date  . Lung surgery  1996    "S/P CABG, had to put staple in lung after it had collapsed"  . Cardiac catheterization  "several"  . Coronary angioplasty  "several"  . Coronary angioplasty with stent placement  2005; 12/13/2014    "2; 1"  . Coronary artery bypass graft  1996    CABG X5  . Coronary artery bypass graft  March 2007    CABG X3  . Laparoscopic cholecystectomy    . Hernia repair    . Umbilical hernia repair      w/chole  . Green light laser turp (transurethral resection of prostate  2000's    "not cancerous"  . Esophagogastroduodenoscopy  (egd) with esophageal dilation  2000  . Cardiac catheterization N/A 12/13/2014    Procedure: Left Heart Cath and Coronary Angiography;  Surgeon: Jettie Booze, MD;  Location: [place] [facility];  Service: Cardiovascular;  Laterality: N/A;  . Cardiac catheterization  12/13/2014    Procedure: Coronary Stent Intervention;  Surgeon: Jettie Booze, MD;  Location: [place] [facility];  Service: Cardiovascular;;    Allergies  Allergies  Allergen Reactions  . Ciprofloxacin     GI upset  . Hydrochlorothiazide Other (See Comments)    dehydrates Dehydration  . Hydrocodone     Stomach upset Stomach upset  . Hydrocodone-Acetaminophen     Stomach upset  . Hydrocodone-Acetaminophen Nausea Only  . Sulfa Antibiotics   . Sulfacetamide Sodium   . Penicillins Hives and Rash    HPI  69 year old male with the above complex past medical history. He is status post coronary artery bypass grafting in 1999 with redo bypass grafting in 2007. He was recently hospitalized at [facility], with progressive dyspnea on exertion and chest pain and ruled in for non-ST elevation MI. Catheterization was performed and showed severe disease within the vein graft to the obtuse  marginal and this area was successfully treated using a 3.5 x 12 mm Synergy drug-eluting stent. Patient was subsequent discharged and since discharge has had significant improvement in his exercise tolerance. He has not had any recurrence of chest pain or dyspnea on exertion. He has noted on more than one occasion, fleeting episodes of dyspnea, occurring at rest, lasting a few seconds, and resolving spontaneously. He describes these as feeling as though he has to get a good breath. He has not had PND, orthopnea, dizziness, syncope, edema, or early satiety. He does not think he'll be able to afford brilinta in the long run but does have some samples for the next 2 weeks.  Home Medications  Prior to Admission medications   Medication Sig Start Date  End Date Taking? Authorizing Provider  acetaminophen (TYLENOL) 325 MG tablet Take 650 mg by mouth every 6 (six) hours as needed.   Yes Historical Provider, MD  allopurinol (ZYLOPRIM) 100 MG tablet Take 1 tablet (100 mg total) by mouth at bedtime. 11/07/14  Yes Richard Maceo Pro., MD  amLODipine (NORVASC) 5 MG tablet Take 1 tablet (5 mg total) by mouth daily. 11/13/14  Yes Richard Maceo Pro., MD  aspirin 81 MG tablet Take 81 mg by mouth every morning.    Yes Historical Provider, MD  atorvastatin (LIPITOR) 10 MG tablet Take 1 tablet (10 mg total) by mouth daily. 11/13/14  Yes Richard Maceo Pro., MD  carvedilol (COREG) 12.5 MG tablet Take 1 tablet (12.5 mg total) by mouth 2 (two) times daily with a meal. 11/07/14  Yes [name]., MD  magnesium oxide (MAG-OX) 400 MG tablet Take 400 mg by mouth 2 (two) times daily.     Yes Historical Provider, MD  Multiple Vitamin (MULTIVITAMIN) tablet Take 1 tablet by mouth every morning.    Yes Historical Provider, MD  nitroGLYCERIN (NITROSTAT) 0.4 MG SL tablet Place 1 tablet (0.4 mg total) under the tongue every 5 (five) minutes as needed. May repeat up to three times. 10/22/14  Yes Burnell Blanks, MD  omeprazole (PRILOSEC) 20 MG capsule Take 1 capsule (20 mg total) by mouth 2 (two) times daily. 11/13/14  Yes Richard Maceo Pro., MD  sodium bicarbonate 650 MG tablet Take 650 mg by mouth 2 (two) times daily.   Yes Historical Provider, MD  ticagrelor (BRILINTA) 90 MG TABS tablet Take 1 tablet (90 mg total) by mouth 2 (two) times daily. 12/14/14  Yes Dayna N Dunn, PA-C  clopidogrel (PLAVIX) 75 MG tablet Take 1 tablet (75 mg total) by mouth daily. 12/18/14   Rogelia Mire, NP    Review of Systems  He has had some vague brief dyspnea at rest but has not had any dyspnea on exertion, or chest pain. He denies PND, orthopnea, dizziness, syncope, edema, or early satiety.  All other systems reviewed and are otherwise negative except as noted  above.  Physical Exam  VS:  BP 102/60 mmHg  Pulse 71  Ht 5\' 9"  (1.753 m)  Wt 225 lb 9.6 oz (102.331 kg)  BMI 33.30 kg/m2  SpO2 97% , BMI Body mass index is 33.3 kg/(m^2). GEN: Well nourished, well developed, in no acute distress. HEENT: normal. Neck: Supple, no JVD, carotid bruits, or masses. Cardiac: RRR, no murmurs, rubs, or gallops. No clubbing, cyanosis, edema.  Radials/DP/PT 2+ and equal bilaterally. Right groin catheterization site is without bleeding, bruit, or hematoma. Respiratory:  Respirations regular and unlabored, clear to auscultation bilaterally. GI: Soft,  nontender, nondistended, BS + x 4. MS: no deformity or atrophy. Skin: warm and dry, no rash. Neuro:  Strength and sensation are intact. Psych: Normal affect.  Accessory Clinical Findings  ECG - regular sinus rhythm, 65, PVCs, mild lateral ST depression, which is similar to prior ECGs.  Assessment & Plan  1.  Non-ST segment elevation myocardial infarctions, subsequent episode of care/coronary artery disease: Status post PCI and stenting of the vein graft to the obtuse marginal last week. He has had improvement in exercise tolerance and no chest pain or dyspnea on exertion. He has noted fleeting episodes of mild dyspnea at rest. His description of the symptoms is similar to what we have seen people experiencing with Brilinta. I have recommended that he continue brilinta, 90 mg twice a day so long as symptoms are not unbearable. He does not think he will be able to afford brilinta in the long run. He currently has enough refills for the next 2 weeks and we have obtained 2 additional weeks worth of Brilinta samples. He lives in [place] and would prefer not to have to keep traveling to pick up samples of Brilinta and has requested switching to Plavix once the current amount of samples runs out. This seems reasonable. He otherwise remains on aspirin, statin, beta blocker therapy. He is considering cardiac rehabilitation.  2.  Essential hypertension: Stable on current regimen.  3. Hyperlipidemia: LDL was 80 with normal LFTs earlier this month. Continue statin therapy.  4. Morbid obesity: He looks to subtly increase his activity and is considering cardiac rehabilitation.  5. Type 2 diabetes mellitus: This has been diet controlled.  6. Stage 3-4 chronic kidney disease: Creatinine remained stable during hospitalization.  7. Disposition: He will change from Brilinta to plavix in approx 1 month.  Follow-up with Dr. Angelena Form in 3 mos or sooner if necessary.   Murray Hodgkins, NP 12/18/2014, 4:43 PM

## 2014-12-18 NOTE — Patient Instructions (Addendum)
Medication Instructions:  Your physician has recommended you make the following change in your medication:  1) COMPLETE Your supply of Brilinta then START Plavix 75mg  daily.an Rx has been sent to your pharmacy   Labwork: None   Testing/Procedures: None   Follow-Up: Your physician wants you to follow-up in: 3 months with Dr.Mcalhany You will receive a reminder letter in the mail two months in advance. If you don't receive a letter, please call our office to schedule the follow-up appointment.   Any Other Special Instructions Will Be Listed Below (If Applicable).

## 2014-12-23 ENCOUNTER — Encounter: Payer: Medicare Other | Admitting: Cardiology

## 2014-12-23 ENCOUNTER — Other Ambulatory Visit: Payer: Self-pay

## 2014-12-23 DIAGNOSIS — I214 Non-ST elevation (NSTEMI) myocardial infarction: Secondary | ICD-10-CM

## 2015-01-01 ENCOUNTER — Other Ambulatory Visit: Payer: Self-pay | Admitting: Family Medicine

## 2015-01-13 ENCOUNTER — Encounter: Payer: Self-pay | Admitting: Family Medicine

## 2015-01-13 ENCOUNTER — Ambulatory Visit (INDEPENDENT_AMBULATORY_CARE_PROVIDER_SITE_OTHER): Payer: Medicare Other | Admitting: Family Medicine

## 2015-01-13 VITALS — BP 118/48 | HR 82 | Temp 98.1°F | Resp 16 | Ht 70.0 in | Wt 230.0 lb

## 2015-01-13 DIAGNOSIS — K219 Gastro-esophageal reflux disease without esophagitis: Secondary | ICD-10-CM

## 2015-01-13 DIAGNOSIS — I150 Renovascular hypertension: Secondary | ICD-10-CM

## 2015-01-13 DIAGNOSIS — I25700 Atherosclerosis of coronary artery bypass graft(s), unspecified, with unstable angina pectoris: Secondary | ICD-10-CM | POA: Diagnosis not present

## 2015-01-13 DIAGNOSIS — N184 Chronic kidney disease, stage 4 (severe): Secondary | ICD-10-CM

## 2015-01-13 DIAGNOSIS — M1610 Unilateral primary osteoarthritis, unspecified hip: Secondary | ICD-10-CM

## 2015-01-13 DIAGNOSIS — E1121 Type 2 diabetes mellitus with diabetic nephropathy: Secondary | ICD-10-CM

## 2015-01-13 NOTE — Progress Notes (Signed)
Patient ID: Ronald [name] [facility], male   DOB: May 31, 1946, 69 y.o.   MRN: [id number]    Subjective:  HPI   Patient is here for follow up, this was scheduled originally for 1 month follow up but after his last visit July 5th he went to ER on July 15th. He was diagnosed with having a light heart attack-he was D/C on July 16th and had stent put in due to clogged artery at 99%. He has followed up with cardiologist and feels better.   Chronic abdominal pain-has not bothered patient since leaving the hospital.  Chronic hip pain-he has not noticed it bothering him as much but he has not been as active.  LS spine Xray -was done and showed-No acute fracture. There is about 6 mm anterolisthesis L4 on L5 vertebral body. Mild disc space flattening at L3-L4 and L4-L5 level. Facet degenerative changes L4 and L5 level.  Hip Xrays showed-No acute fracture or subluxation. Mild spurring of right superior acetabulum. Bilateral hip joints are symmetrical in appearance.  Prior to Admission medications   Medication Sig Start Date End Date Taking? Authorizing Provider  acetaminophen (TYLENOL) 325 MG tablet Take 650 mg by mouth every 6 (six) hours as needed.    Historical Provider, MD  allopurinol (ZYLOPRIM) 100 MG tablet Take 1 tablet (100 mg total) by mouth at bedtime. 11/07/14   Richard Maceo Pro., MD  amLODipine (NORVASC) 5 MG tablet 1 TABLET, ORAL, DAILY 01/01/15   [name]., MD  aspirin 81 MG tablet Take 81 mg by mouth every morning.     Historical Provider, MD  atorvastatin (LIPITOR) 10 MG tablet Take 1 tablet (10 mg total) by mouth daily. 11/13/14   Richard Maceo Pro., MD  carvedilol (COREG) 12.5 MG tablet Take 1 tablet (12.5 mg total) by mouth 2 (two) times daily with a meal. 11/07/14   [name]., MD  clopidogrel (PLAVIX) 75 MG tablet Take 1 tablet (75 mg total) by mouth daily. 12/18/14   Rogelia Mire, NP  magnesium oxide (MAG-OX) 400 MG tablet Take 400 mg by mouth 2 (two) times  daily.      Historical Provider, MD  Multiple Vitamin (MULTIVITAMIN) tablet Take 1 tablet by mouth every morning.     Historical Provider, MD  nitroGLYCERIN (NITROSTAT) 0.4 MG SL tablet Place 1 tablet (0.4 mg total) under the tongue every 5 (five) minutes as needed. May repeat up to three times. 10/22/14   Burnell Blanks, MD  omeprazole (PRILOSEC) 20 MG capsule Take 1 capsule (20 mg total) by mouth 2 (two) times daily. 11/13/14   Richard Maceo Pro., MD  sodium bicarbonate 650 MG tablet Take 650 mg by mouth 2 (two) times daily.    Historical Provider, MD  ticagrelor (BRILINTA) 90 MG TABS tablet Take 1 tablet (90 mg total) by mouth 2 (two) times daily. 12/14/14   Charlie Pitter, PA-C    Patient Active Problem List   Diagnosis Date Noted  . NSTEMI (non-ST elevated myocardial infarction) 12/13/2014  . Absolute anemia 10/04/2014  . AA (aortic aneurysm) 10/04/2014  . Benign enlargement of prostate 10/04/2014  . Arteriosclerosis of coronary artery 10/04/2014  . Diabetes mellitus, type 2 10/04/2014  . Acid reflux 10/04/2014  . 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 HLD (hyperlipidemia) 10/04/2014  . BP (high blood pressure) 10/04/2014  . Osteoarthrosis 10/04/2014  . Adult BMI 30+ 10/04/2014  . 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  Chest pain, atypical 12/30/2012  . Chronic kidney disease (CKD), stage IV (severe)   . Coronary artery disease 11/09/2010  . Abdominal pain 11/09/2010  . Shortness of breath 11/12/2009  . Renal artery stenosis    . Mixed hyperlipidemia 09/04/2008  . History of redo bypass grafting 09/04/2008  . Diabetes mellitus with nephropathy     Past Medical History  Diagnosis Date  . CAD (coronary artery disease) 2007    a. s/p CABG- IMA-LAD, VG-Cx, VG-RCA, VG-diag in 1999. B. sp redo CABG- VG-OM, VG-RCA in 2007 due to VG disease. c. NSTEMI 11/2014 s/p DES to SVG-OM from the Y graft.  . Ischemic cardiomyopathy 2006    EF  40% to 50% by 2D echo in 2006;  Echo 12/31/12: Mild LVH, EF 50-55%, normal wall motion.   . CKD (chronic kidney disease), stage IV     Creatinine 1.9 to 2.2  . Hyperlipidemia   . Hypertension   . Renal artery stenosis   . Emphysema   . COPD (chronic obstructive pulmonary disease)   . GERD (gastroesophageal reflux disease)   . Esophageal stricture 07/02/1998    EGD  . Dysrhythmia     bigeminy pvcs  . CHF (congestive heart failure)   . History of hiatal hernia   . Walking pneumonia 1990's  . Type II diabetes mellitus     Diet control   . History of gout     "last flareup was in 2007" (12/13/2014)  . Chronic lower back pain   . Arthritis     "hips; back" (12/13/2014)  . Degenerative disc disease, lumbar   . Deafness in left ear     Social History   Social History  . Marital Status: Married    Spouse Name: N/A  . Number of Children: 2  . Years of Education: N/A   Occupational History  . Retired    Social History Main Topics  . Smoking status: Former Smoker -- 3.00 packs/day for 20 years    Types: Cigarettes    Quit date: 07/25/1986  . Smokeless tobacco: Never Used  . Alcohol Use: No  . Drug Use: No  . Sexual Activity: Not Currently   Other Topics Concern  . 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    Allergies  Allergen Reactions  . Ciprofloxacin     GI upset  . Hydrochlorothiazide Other (See Comments)    dehydrates Dehydration  . Hydrocodone     Stomach upset Stomach upset  . Hydrocodone-Acetaminophen     Stomach upset  . Hydrocodone-Acetaminophen Nausea Only  . Sulfa Antibiotics   . Sulfacetamide Sodium   . Penicillins Hives and Rash    Review of Systems  Constitutional: Positive for malaise/fatigue. Negative for fever and chills.  Eyes: Negative for blurred vision.  Respiratory: Positive for shortness of breath. Negative for cough and wheezing.   Cardiovascular: Negative for chest pain, palpitations and leg swelling.    Gastrointestinal: Negative for heartburn, nausea and abdominal pain.       He has had no further abdominal pain since his MI and heart stenting.  Musculoskeletal: Positive for back pain. Negative for myalgias, joint pain (on the right side of the back) and neck pain.  Neurological: Positive for weakness. Negative for dizziness and headaches.  Psychiatric/Behavioral: Negative.     Immunization History  Administered Date(s) Administered  . Pneumococcal Conjugate-13 12/25/2013  . Pneumococcal Polysaccharide-23 03/04/2009  . Td 10/10/2003   Objective:  BP 118/48 mmHg  Pulse 82  Temp(Src) 98.1 F (36.7 C)  Resp 16  Ht 5\' 10"  (1.778 m)  Wt 230 lb (104.327 kg)  BMI 33.00 kg/m2  Physical Exam  Constitutional: He is oriented to person, place, and time and well-developed, well-nourished, and in no distress.  Patient with truncal obesity  HENT:  Head: Normocephalic and atraumatic.  Right Ear: External ear normal.  Left Ear: External ear normal.  Nose: Nose normal.  Eyes: Conjunctivae are normal.  Neck: Neck supple.  Cardiovascular: Normal rate, regular rhythm and normal heart sounds.   Pulmonary/Chest: Effort normal and breath sounds normal.  Abdominal: Soft. Bowel sounds are normal.  Truncal obesity present  Neurological: He is alert and oriented to person, place, and time. Gait normal.  Skin: Skin is warm and dry.  Psychiatric: Mood, memory, affect and judgment normal.    Lab Results  Component Value Date   WBC 7.6 12/14/2014   HGB 13.5 12/14/2014   HCT 40.6 12/14/2014   PLT 143* 12/14/2014   GLUCOSE 156* 12/14/2014   CHOL 135 12/13/2014   TRIG 124 12/13/2014   HDL 30* 12/13/2014   LDLCALC 80 12/13/2014   TSH 1.472 12/13/2014   PSA 0.8 09/13/2013   INR 1.04 12/13/2014   HGBA1C 6.2* 12/13/2014    CMP     Component Value Date/Time   NA 140 12/14/2014 0220   NA 140 12/03/2014 1611   K 4.4 12/14/2014 0220   CL 111 12/14/2014 0220   CO2 22 12/14/2014 0220    GLUCOSE 156* 12/14/2014 0220   GLUCOSE 77 12/03/2014 1611   GLUCOSE 82 06/13/2006 1621   BUN 24* 12/14/2014 0220   BUN 32* 12/03/2014 1611   CREATININE 1.98* 12/14/2014 0220   CREATININE 2.0* 03/06/2014   CALCIUM 9.0 12/14/2014 0220   PROT 6.5 12/03/2014 1611   PROT 6.5 12/30/2012 1532   ALBUMIN 3.9 12/30/2012 1532   AST 19 12/03/2014 1611   ALT 20 12/03/2014 1611   ALKPHOS 175* 12/03/2014 1611   BILITOT 1.0 12/03/2014 1611   BILITOT 1.1 12/30/2012 1532   GFRNONAA 32* 12/14/2014 0220   GFRAA 37* 12/14/2014 0220    Assessment and plan CAD with [place] with successful recent stenting All risk factors treated. Abdominal Pain Resolved after cardiac stenting. HTN HLD OA/DDD  Miguel Aschoff MD [facility] 01/13/2015 4:15 PM

## 2015-01-23 DIAGNOSIS — I10 Essential (primary) hypertension: Secondary | ICD-10-CM | POA: Diagnosis not present

## 2015-01-23 DIAGNOSIS — N183 Chronic kidney disease, stage 3 (moderate): Secondary | ICD-10-CM | POA: Diagnosis not present

## 2015-01-23 DIAGNOSIS — E872 Acidosis: Secondary | ICD-10-CM | POA: Diagnosis not present

## 2015-01-23 DIAGNOSIS — I701 Atherosclerosis of renal artery: Secondary | ICD-10-CM | POA: Diagnosis not present

## 2015-01-30 DIAGNOSIS — N183 Chronic kidney disease, stage 3 (moderate): Secondary | ICD-10-CM | POA: Diagnosis not present

## 2015-01-30 DIAGNOSIS — I701 Atherosclerosis of renal artery: Secondary | ICD-10-CM | POA: Diagnosis not present

## 2015-01-30 DIAGNOSIS — I10 Essential (primary) hypertension: Secondary | ICD-10-CM | POA: Diagnosis not present

## 2015-01-30 DIAGNOSIS — E1129 Type 2 diabetes mellitus with other diabetic kidney complication: Secondary | ICD-10-CM | POA: Diagnosis not present

## 2015-03-04 ENCOUNTER — Telehealth: Payer: Self-pay | Admitting: Family Medicine

## 2015-03-04 NOTE — Telephone Encounter (Signed)
Pt got a jury summons letter and needs you to write a letter to excuse him from duty.  Call back is [phone]  Thanks Con Memos

## 2015-03-05 NOTE — Telephone Encounter (Signed)
Yes  thx

## 2015-03-05 NOTE — Telephone Encounter (Signed)
i am not sure what to put in a letter and how to make it, can you review this and get the letter ready for the patient. Please:)-aa

## 2015-03-05 NOTE — Telephone Encounter (Signed)
Ok to write letter? Please advise.  ° °Thanks °

## 2015-03-05 NOTE — Telephone Encounter (Signed)
Patient;s wife advised, note placed up front-aa

## 2015-03-05 NOTE — Telephone Encounter (Signed)
I have written note.

## 2015-03-18 ENCOUNTER — Ambulatory Visit (INDEPENDENT_AMBULATORY_CARE_PROVIDER_SITE_OTHER): Payer: Medicare Other | Admitting: Family Medicine

## 2015-03-18 VITALS — BP 130/88 | HR 64 | Temp 97.9°F | Resp 16 | Wt 236.0 lb

## 2015-03-18 DIAGNOSIS — M79605 Pain in left leg: Secondary | ICD-10-CM

## 2015-03-18 DIAGNOSIS — Z23 Encounter for immunization: Secondary | ICD-10-CM

## 2015-03-18 DIAGNOSIS — G8929 Other chronic pain: Secondary | ICD-10-CM

## 2015-03-18 DIAGNOSIS — M719 Bursopathy, unspecified: Secondary | ICD-10-CM

## 2015-03-18 DIAGNOSIS — M25552 Pain in left hip: Secondary | ICD-10-CM | POA: Diagnosis not present

## 2015-03-18 MED ORDER — PREDNISONE 10 MG PO TABS: 10.0000 mg | ORAL_TABLET | ORAL | Status: DC

## 2015-03-18 NOTE — Progress Notes (Signed)
Patient ID: Ronald [name] [facility], male   DOB: [birth year], 69 y.o.   MRN: [id number]   Ronald [name]  MRN: [id number] DOB: 23-Jul-1945  Subjective:  HPI   1. Hip pain, chronic, left Patient presents with complain of hip pain.  It has been bothering him for about 1 year.  He has had images done in the past.  He would like to have evaluation today as he states it is getting harder for him to walk much and he is unable to sleep on his left side.  2. Pain of left lower extremity Patient complains of bilateral leg pain with the left being much worse than the right.  He states he has had that pain for about a year.  He notes that during the day he does not seem to be bothered with it much but once he goes to bed he has trouble.    3 Need for influenza vaccination  - Flu vaccine HIGH DOSE PF (Fluzone High dose)   Patient Active Problem List   Diagnosis Date Noted  . NSTEMI (non-ST elevated myocardial infarction) ([place]) 12/13/2014  . Absolute anemia 10/04/2014  . AA (aortic aneurysm) ([place]) 10/04/2014  . Benign enlargement of prostate 10/04/2014  . Arteriosclerosis of coronary artery 10/04/2014  . Diabetes mellitus, type 2 ([place]) 10/04/2014  . Acid reflux 10/04/2014  . 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 HLD (hyperlipidemia) 10/04/2014  . BP (high blood pressure) 10/04/2014  . Osteoarthrosis 10/04/2014  . Adult BMI 30+ 10/04/2014  . 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 Chest pain, atypical 12/30/2012  . Chronic kidney disease (CKD), stage IV (severe) ([place])   . Coronary artery disease 11/09/2010  . Abdominal pain 11/09/2010  . Shortness of breath 11/12/2009  . Renal artery stenosis    . Mixed hyperlipidemia 09/04/2008  . History of redo bypass grafting 09/04/2008  . Diabetes mellitus with nephropathy [facility])     Past Medical History  Diagnosis Date  . CAD (coronary artery disease) 2007    a. s/p CABG- IMA-LAD, VG-Cx, VG-RCA, VG-diag in 1999. B.  sp redo CABG- VG-OM, VG-RCA in 2007 due to VG disease. c. NSTEMI 11/2014 s/p DES to SVG-OM from the Y graft.  . Ischemic cardiomyopathy 2006    EF 40% to 50% by 2D echo in 2006;  Echo 12/31/12: Mild LVH, EF 50-55%, normal wall motion.   . CKD (chronic kidney disease), stage IV     Creatinine 1.9 to 2.2  . Hyperlipidemia   . Hypertension   . Renal artery stenosis   . Emphysema   . COPD (chronic obstructive pulmonary disease)   . GERD (gastroesophageal reflux disease)   . Esophageal stricture 07/02/1998    EGD  . Dysrhythmia     bigeminy pvcs  . CHF (congestive heart failure)   . History of hiatal hernia   . Walking pneumonia 1990's  . Type II diabetes mellitus     Diet control   . History of gout     "last flareup was in 2007" (12/13/2014)  . Chronic lower back pain   . Arthritis     "hips; back" (12/13/2014)  . Degenerative disc disease, lumbar   . Deafness in left ear     Social History   Social History  . Marital Status: Married    Spouse Name: N/A  . Number of Children: 2  . Years of Education: N/A   Occupational History  . Retired  Social History Main Topics  . Smoking status: Former Smoker -- 3.00 packs/day for 20 years    Types: Cigarettes    Quit date: 07/25/1986  . Smokeless tobacco: Never Used  . Alcohol Use: No  . Drug Use: No  . Sexual Activity: Not Currently   Other Topics Concern  . 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    Outpatient Prescriptions Prior to Visit  Medication Sig Dispense Refill  . acetaminophen (TYLENOL) 325 MG tablet Take 650 mg by mouth every 6 (six) hours as needed.    [name] allopurinol (ZYLOPRIM) 100 MG tablet Take 1 tablet (100 mg total) by mouth at bedtime. 90 tablet 3  . amLODipine (NORVASC) 5 MG tablet 1 TABLET, ORAL, DAILY 90 tablet 4  . aspirin 81 MG tablet Take 81 mg by mouth every morning.     [name] atorvastatin (LIPITOR) 10 MG tablet Take 1 tablet (10 mg total) by mouth daily. 90 tablet 3  .  carvedilol (COREG) 12.5 MG tablet Take 1 tablet (12.5 mg total) by mouth 2 (two) times daily with a meal. 90 tablet 3  . clopidogrel (PLAVIX) 75 MG tablet Take 1 tablet (75 mg total) by mouth daily. 30 tablet 11  . magnesium oxide (MAG-OX) 400 MG tablet Take 400 mg by mouth 2 (two) times daily.      . Multiple Vitamin (MULTIVITAMIN) tablet Take 1 tablet by mouth every morning.     . nitroGLYCERIN (NITROSTAT) 0.4 MG SL tablet Place 1 tablet (0.4 mg total) under the tongue every 5 (five) minutes as needed. May repeat up to three times. 25 tablet 6  . omeprazole (PRILOSEC) 20 MG capsule Take 1 capsule (20 mg total) by mouth 2 (two) times daily. 180 capsule 3  . sodium bicarbonate 650 MG tablet Take 650 mg by mouth 2 (two) times daily.    . ticagrelor (BRILINTA) 90 MG TABS tablet Take 1 tablet (90 mg total) by mouth 2 (two) times daily. 60 tablet 11   No facility-administered medications prior to visit.    Allergies  Allergen Reactions  . Ciprofloxacin     GI upset  . Hydrochlorothiazide Other (See Comments)    dehydrates Dehydration  . Hydrocodone     Stomach upset Stomach upset  . Hydrocodone-Acetaminophen     Stomach upset  . Hydrocodone-Acetaminophen Nausea Only  . Sulfa Antibiotics   . Sulfacetamide Sodium   . Penicillins Hives and Rash    Review of Systems  Constitutional: Negative.   Respiratory: Negative.   Cardiovascular: Negative.   Musculoskeletal: Positive for myalgias, back pain and joint pain (Hip). Negative for falls and neck pain.  Neurological: Negative for dizziness and headaches.   Objective:  BP 130/88 mmHg  Pulse 64  Temp(Src) 97.9 F (36.6 C) (Oral)  Resp 16  Wt 236 lb (107.049 kg)  Physical Exam  Constitutional: He is oriented to person, place, and time and well-developed, well-nourished, and in no distress.  Truncal obesity.  HENT:  Head: Normocephalic and atraumatic.  Right Ear: External ear normal.  Left Ear: External ear normal.  Nose: Nose  normal.  Eyes: Conjunctivae are normal.  Neck: Neck supple.  Cardiovascular: Normal rate, regular rhythm and normal heart sounds.   Pulmonary/Chest: Effort normal and breath sounds normal.  Abdominal: Soft.  Neurological: He is alert and oriented to person, place, and time.  Skin: Skin is warm and dry.  Psychiatric: Mood, memory, affect and judgment normal.  Assessment and Plan :  Hip pain, chronic, left  Pain of left lower extremity  Need for influenza vaccination - Plan: Flu vaccine HIGH DOSE PF (Fluzone High dose)  Refer to ortho. CAD/s/p stenting July 2016. I have done the exam and reviewed the above chart and it is accurate to the best of my knowledge.  Miguel Aschoff MD [facility] 03/18/2015 4:24 PM

## 2015-03-20 ENCOUNTER — Other Ambulatory Visit: Payer: Self-pay | Admitting: Cardiovascular Disease

## 2015-03-20 DIAGNOSIS — I25700 Atherosclerosis of coronary artery bypass graft(s), unspecified, with unstable angina pectoris: Secondary | ICD-10-CM

## 2015-03-20 MED ORDER — CLOPIDOGREL BISULFATE 75 MG PO TABS: 75.0000 mg | ORAL_TABLET | Freq: Every day | ORAL | Status: DC

## 2015-04-21 ENCOUNTER — Ambulatory Visit (INDEPENDENT_AMBULATORY_CARE_PROVIDER_SITE_OTHER): Payer: Medicare Other | Admitting: Family Medicine

## 2015-04-21 VITALS — BP 152/74 | HR 82 | Temp 98.2°F | Resp 16 | Wt 233.0 lb

## 2015-04-21 DIAGNOSIS — M79605 Pain in left leg: Secondary | ICD-10-CM | POA: Diagnosis not present

## 2015-04-21 DIAGNOSIS — E1121 Type 2 diabetes mellitus with diabetic nephropathy: Secondary | ICD-10-CM | POA: Diagnosis not present

## 2015-04-21 DIAGNOSIS — R109 Unspecified abdominal pain: Secondary | ICD-10-CM

## 2015-04-21 LAB — POCT GLYCOSYLATED HEMOGLOBIN (HGB A1C): Hemoglobin A1C: 5.7

## 2015-04-21 NOTE — Progress Notes (Signed)
Patient ID: Ronald [name] [facility], male   DOB: 1945/12/09, 69 y.o.   MRN: [id number]   Ronald [name]  MRN: [id number] DOB: 08/08/1945  Subjective:  HPI  1. Type 2 diabetes mellitus with diabetic nephropathy, without long-term current use of insulin [facility]) Patient is a 69 year old male who presents for follow up for diabetes.  He was last seen in October for an acute visit.  No changes were made at that time.  His last A1C was 12/13/14 and it was 6.2.     2. Side pain The patient is also here to recheck his side pain.  He states he has left leg, left side and groin pain.  His pain has not gotten any better. He was unable to take the Prednisone, as he remembered that Prednisone has made him sick in the past.   Patient Active Problem List   Diagnosis Date Noted  . NSTEMI (non-ST elevated myocardial infarction) ([place]) 12/13/2014  . Absolute anemia 10/04/2014  . AA (aortic aneurysm) ([place]) 10/04/2014  . Benign enlargement of prostate 10/04/2014  . Arteriosclerosis of coronary artery 10/04/2014  . Diabetes mellitus, type 2 ([place]) 10/04/2014  . Acid reflux 10/04/2014  . 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 HLD (hyperlipidemia) 10/04/2014  . BP (high blood pressure) 10/04/2014  . Osteoarthrosis 10/04/2014  . Adult BMI 30+ 10/04/2014  . 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 Chest pain, atypical 12/30/2012  . Chronic kidney disease (CKD), stage IV (severe) ([place])   . Coronary artery disease 11/09/2010  . Abdominal pain 11/09/2010  . Shortness of breath 11/12/2009  . Renal artery stenosis    . Mixed hyperlipidemia 09/04/2008  . History of redo bypass grafting 09/04/2008  . Diabetes mellitus with nephropathy [facility])     Past Medical History  Diagnosis Date  . CAD (coronary artery disease) 2007    a. s/p CABG- IMA-LAD, VG-Cx, VG-RCA, VG-diag in 1999. B. sp redo CABG- VG-OM, VG-RCA in 2007 due to VG disease. c. NSTEMI 11/2014 s/p DES to SVG-OM from the Y  graft.  . Ischemic cardiomyopathy 2006    EF 40% to 50% by 2D echo in 2006;  Echo 12/31/12: Mild LVH, EF 50-55%, normal wall motion.   . CKD (chronic kidney disease), stage IV     Creatinine 1.9 to 2.2  . Hyperlipidemia   . Hypertension   . Renal artery stenosis   . Emphysema   . COPD (chronic obstructive pulmonary disease)   . GERD (gastroesophageal reflux disease)   . Esophageal stricture 07/02/1998    EGD  . Dysrhythmia     bigeminy pvcs  . CHF (congestive heart failure)   . History of hiatal hernia   . Walking pneumonia 1990's  . Type II diabetes mellitus     Diet control   . History of gout     "last flareup was in 2007" (12/13/2014)  . Chronic lower back pain   . Arthritis     "hips; back" (12/13/2014)  . Degenerative disc disease, lumbar   . Deafness in left ear     Social History   Social History  . Marital Status: Married    Spouse Name: N/A  . Number of Children: 2  . Years of Education: N/A   Occupational History  . Retired    Social History Main Topics  . Smoking status: Former Smoker -- 3.00 packs/day for 20 years    Types: Cigarettes    Quit date:  07/25/1986  . Smokeless tobacco: Never Used  . Alcohol Use: No  . Drug Use: No  . Sexual Activity: Not Currently   Other Topics Concern  . 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    Outpatient Prescriptions Prior to Visit  Medication Sig Dispense Refill  . acetaminophen (TYLENOL) 325 MG tablet Take 650 mg by mouth every 6 (six) hours as needed.    [name] allopurinol (ZYLOPRIM) 100 MG tablet Take 1 tablet (100 mg total) by mouth at bedtime. 90 tablet 3  . amLODipine (NORVASC) 5 MG tablet 1 TABLET, ORAL, DAILY 90 tablet 4  . aspirin 81 MG tablet Take 81 mg by mouth every morning.     [name] atorvastatin (LIPITOR) 10 MG tablet Take 1 tablet (10 mg total) by mouth daily. 90 tablet 3  . carvedilol (COREG) 12.5 MG tablet Take 1 tablet (12.5 mg total) by mouth 2 (two) times daily with a meal. 90  tablet 3  . clopidogrel (PLAVIX) 75 MG tablet Take 1 tablet (75 mg total) by mouth daily. 90 tablet 3  . magnesium oxide (MAG-OX) 400 MG tablet Take 400 mg by mouth 2 (two) times daily.      . Multiple Vitamin (MULTIVITAMIN) tablet Take 1 tablet by mouth every morning.     . nitroGLYCERIN (NITROSTAT) 0.4 MG SL tablet Place 1 tablet (0.4 mg total) under the tongue every 5 (five) minutes as needed. May repeat up to three times. 25 tablet 6  . omeprazole (PRILOSEC) 20 MG capsule Take 1 capsule (20 mg total) by mouth 2 (two) times daily. 180 capsule 3  . sodium bicarbonate 650 MG tablet Take 650 mg by mouth 2 (two) times daily.    . predniSONE (DELTASONE) 10 MG tablet Take 1 tablet (10 mg total) by mouth See admin instructions. 60 mg x 2d, 50 mg x 2d, 40 mg x 2d, 30 mg x 2d, 20 mg x 2d, 10 mg x 2d 42 tablet 0   No facility-administered medications prior to visit.    Allergies  Allergen Reactions  . Ciprofloxacin     GI upset  . Hydrochlorothiazide Other (See Comments)    dehydrates Dehydration  . Hydrocodone     Stomach upset Stomach upset  . Hydrocodone-Acetaminophen     Stomach upset  . Hydrocodone-Acetaminophen Nausea Only  . Sulfa Antibiotics   . Sulfacetamide Sodium   . Penicillins Hives and Rash    Review of Systems  Constitutional: Negative.   Eyes: Negative.   Respiratory: Negative.   Cardiovascular: Negative.   Gastrointestinal: Negative.   Genitourinary: Negative.   Musculoskeletal: Positive for joint pain.       It hurts down the left leg for the patient to lay down.  Skin: Negative.   Neurological: Negative for dizziness and headaches.  Endo/Heme/Allergies: Negative.   Psychiatric/Behavioral: Negative.    Objective:  BP 152/74 mmHg  Pulse 82  Temp(Src) 98.2 F (36.8 C) (Oral)  Resp 16  Wt 233 lb (105.688 kg)  Physical Exam  Constitutional: He is oriented to person, place, and time and well-developed, well-nourished, and in no distress.  Truncal obesity.    HENT:  Head: Normocephalic and atraumatic.  Right Ear: External ear normal.  Left Ear: External ear normal.  Nose: Nose normal.  Eyes: Conjunctivae are normal.  Neck: Neck supple.  Cardiovascular: Normal rate, regular rhythm and normal heart sounds.   Pulmonary/Chest: Effort normal and breath sounds normal.  Abdominal: Soft.  Musculoskeletal:  Normal exam today.. Straight leg raising normal and figure 4 maneuver normal  Neurological: He is alert and oriented to person, place, and time. Gait normal.  Skin: Skin is warm and dry.  Psychiatric: Mood, memory, affect and judgment normal.  HGB A1C is 5.7 today.  Assessment and Plan :  Type 2 diabetes mellitus with diabetic nephropathy, without long-term current use of insulin ([place]) - Plan: POCT glycosylated hemoglobin (Hb A1C)  Side pain--Try Tramadol for pain control..Ortho pending. CAD All risk factors treated. Miguel Aschoff MD [facility] 04/21/2015 4:05 PM  CAD

## 2015-04-30 ENCOUNTER — Ambulatory Visit (INDEPENDENT_AMBULATORY_CARE_PROVIDER_SITE_OTHER): Payer: Medicare Other | Admitting: Cardiovascular Disease

## 2015-04-30 ENCOUNTER — Encounter: Payer: Self-pay | Admitting: Cardiovascular Disease

## 2015-04-30 VITALS — BP 150/70 | HR 66 | Ht 70.0 in | Wt 232.0 lb

## 2015-04-30 DIAGNOSIS — I255 Ischemic cardiomyopathy: Secondary | ICD-10-CM

## 2015-04-30 DIAGNOSIS — I10 Essential (primary) hypertension: Secondary | ICD-10-CM

## 2015-04-30 DIAGNOSIS — M79605 Pain in left leg: Secondary | ICD-10-CM

## 2015-04-30 DIAGNOSIS — N183 Chronic kidney disease, stage 3 unspecified: Secondary | ICD-10-CM

## 2015-04-30 DIAGNOSIS — I2510 Atherosclerotic heart disease of native coronary artery without angina pectoris: Secondary | ICD-10-CM | POA: Diagnosis not present

## 2015-04-30 NOTE — Patient Instructions (Addendum)
Medication Instructions:  Your physician recommends that you continue on your current medications as directed. Please refer to the Current Medication list given to you today.   Labwork: none  Testing/Procedures: Your physician has requested that you have a lower   extremity arterial duplex. This test is an ultrasound of the arteries in the legs . It looks at arterial blood flow in the legs . Allow one hour for Lower   Arterial scans. There are no restrictions or special instructions. To be done in [place] office.    Follow-Up: Your physician wants you to follow-up in: 6 months.  You will receive a reminder letter in the mail two months in advance. If you don't receive a letter, please call our office to schedule the follow-up appointment.   Any Other Special Instructions Will Be Listed Below (If Applicable).     If you need a refill on your cardiac medications before your next appointment, please call your pharmacy.

## 2015-04-30 NOTE — Progress Notes (Signed)
Chief Complaint  Patient presents with  . Leg Pain    History of Present Illness: 69 y.o. male with history of CAD s/p CABG in 1996 (LIMA-LAD, SVG-circumflex, SVG-RCA, SVG-diagonal) with redo bypass in 2007 (SVG-OM, SVG-RCA), ischemic cardiomyopathy, CKD, DM 2, HTN, HLD, renal artery stenosis, COPD, GERD here today for cardiac follow up. He has been followed by Dr. Lia Foyer. Lexiscan Myoview 12/31/12: No ischemia or scar, EF 53%. Echo 12/31/12: Mild LVH, EF 50-55%, normal wall motion. He was hospitalized at [facility] July 2016 with dyspnea and chest pain and ruled in for NSTEMI. Cardiac cath with severe disease in the SVG to OM. This was treated with a 3.5 x 12 mm Synergy DES. He was treated with Brilinta post PCI but developed dyspnea and was changed to Plavix.   He is here today for follow up. He is doing well. No chest pain or SOB. He denies orthopnea, PND or edema. He does note severe left groin, hip and leg pain at night. No pain with walking. No edema.   Primary Care Physician: Miguel Aschoff  Last Lipid Profile: Followed in primary care.   Past Medical History  Diagnosis Date  . CAD (coronary artery disease) 2007    a. s/p CABG- IMA-LAD, VG-Cx, VG-RCA, VG-diag in 1999. B. sp redo CABG- VG-OM, VG-RCA in 2007 due to VG disease. c. NSTEMI 11/2014 s/p DES to SVG-OM from the Y graft.  . Ischemic cardiomyopathy 2006    EF 40% to 50% by 2D echo in 2006;  Echo 12/31/12: Mild LVH, EF 50-55%, normal wall motion.   . CKD (chronic kidney disease), stage IV (HCC)     Creatinine 1.9 to 2.2  . Hyperlipidemia   . Hypertension   . Renal artery stenosis ([place])   . Emphysema   . COPD (chronic obstructive pulmonary disease) ([place])   . GERD (gastroesophageal reflux disease)   . Esophageal stricture 07/02/1998    EGD  . Dysrhythmia     bigeminy pvcs  . CHF (congestive heart failure) ([name])   . History of hiatal hernia   . Walking pneumonia 1990's  . Type II diabetes mellitus (HCC)     Diet control   .  History of gout     "last flareup was in 2007" (12/13/2014)  . Chronic lower back pain   . Arthritis     "hips; back" (12/13/2014)  . Degenerative disc disease, lumbar   . Deafness in left ear     Past Surgical History  Procedure Laterality Date  . Lung surgery  1996    "S/P CABG, had to put staple in lung after it had collapsed"  . Cardiac catheterization  "several"  . Coronary angioplasty  "several"  . Coronary angioplasty with stent placement  2005; 12/13/2014    "2; 1"  . Coronary artery bypass graft  1996    CABG X5  . Coronary artery bypass graft  March 2007    CABG X3  . Laparoscopic cholecystectomy    . Hernia repair    . Umbilical hernia repair      w/chole  . Green light laser turp (transurethral resection of prostate  2000's    "not cancerous"  . Esophagogastroduodenoscopy (egd) with esophageal dilation  2000  . Cardiac catheterization N/A 12/13/2014    Procedure: Left Heart Cath and Coronary Angiography;  Surgeon: Jettie Booze, MD;  Location: [facility];  Service: Cardiovascular;  Laterality: N/A;  . Cardiac catheterization  12/13/2014    Procedure:  Coronary Stent Intervention;  Surgeon: Jettie Booze, MD;  Location: [facility];  Service: Cardiovascular;;    Current Outpatient Prescriptions  Medication Sig Dispense Refill  . acetaminophen (TYLENOL) 325 MG tablet Take 650 mg by mouth every 6 (six) hours as needed.    [name] allopurinol (ZYLOPRIM) 100 MG tablet Take 1 tablet (100 mg total) by mouth at bedtime. 90 tablet 3  . amLODipine (NORVASC) 5 MG tablet 1 TABLET, ORAL, DAILY 90 tablet 4  . aspirin 81 MG tablet Take 81 mg by mouth every morning.     [name] atorvastatin (LIPITOR) 10 MG tablet Take 1 tablet (10 mg total) by mouth daily. 90 tablet 3  . carvedilol (COREG) 12.5 MG tablet Take 1 tablet (12.5 mg total) by mouth 2 (two) times daily with a meal. 90 tablet 3  . clopidogrel (PLAVIX) 75 MG tablet Take 1 tablet (75 mg total) by mouth daily. 90  tablet 3  . magnesium oxide (MAG-OX) 400 MG tablet Take 400 mg by mouth 2 (two) times daily.      . Multiple Vitamin (MULTIVITAMIN) tablet Take 1 tablet by mouth every morning.     . nitroGLYCERIN (NITROSTAT) 0.4 MG SL tablet Place 1 tablet (0.4 mg total) under the tongue every 5 (five) minutes as needed. May repeat up to three times. 25 tablet 6  . omeprazole (PRILOSEC) 20 MG capsule Take 1 capsule (20 mg total) by mouth 2 (two) times daily. 180 capsule 3  . sodium bicarbonate 650 MG tablet Take 650 mg by mouth 2 (two) times daily.     No current facility-administered medications for this visit.    Allergies  Allergen Reactions  . Ciprofloxacin     GI upset  . Hydrochlorothiazide Other (See Comments)    dehydrates Dehydration  . Hydrocodone     Stomach upset Stomach upset  . Hydrocodone-Acetaminophen     Stomach upset  . Hydrocodone-Acetaminophen Nausea Only  . Sulfa Antibiotics   . Sulfacetamide Sodium   . Penicillins Hives and Rash    Social History   Social History  . Marital Status: Married    Spouse Name: N/A  . Number of Children: 2  . Years of Education: N/A   Occupational History  . Retired    Social History Main Topics  . Smoking status: Former Smoker -- 3.00 packs/day for 20 years    Types: Cigarettes    Quit date: 07/25/1986  . Smokeless tobacco: Never Used  . Alcohol Use: No  . Drug Use: No  . Sexual Activity: Not Currently   Other Topics Concern  . 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    Family History  Problem Relation Age of Onset  . Heart attack Mother     MI  . Stroke Mother   . Heart disease Mother   . Hypertension Mother   . Hyperlipidemia Mother   . Heart disease Father   . Rheumatic fever Father     Review of Systems:  As stated in the HPI and otherwise negative.   BP 150/70 mmHg  Pulse 66  Ht _0  (1.778 m)  Wt 232 lb (105.235 kg)  BMI 33.29 kg/m2  Physical Examination: General: Well  developed, well nourished, NAD HEENT: OP clear, mucus membranes moist SKIN: warm, dry. No rashes. Neuro: No focal deficits Musculoskeletal: Muscle strength 5/5 all ext Psychiatric: Mood and affect normal Neck: No JVD, no carotid bruits, no thyromegaly, no lymphadenopathy.  Lungs:Clear bilaterally, no wheezes, rhonci, crackles Cardiovascular: Regular rate and rhythm. No murmurs, gallops or rubs. Abdomen:Soft. Bowel sounds present. Non-tender.  Extremities: No lower extremity edema. Pulses are 2 + in the bilateral DP/PT  Cardiac cath July 2016: Dominance: Co-dominant   Left Anterior Descending   . Mid LAD lesion, 100% stenosed.   . Second Diagonal Branch   The vessel is small in size.     Left Circumflex   . Ost Cx to Prox Cx lesion, 100% stenosed.     Right Coronary Artery   . Mid RCA lesion, 100% stenosed.     Graft Angiography    Free Graft to 1st RPLB  SVG is moderate in size. There is mild diffuse disease in the graft.   . Origin lesion, 25% stenosed. The lesion was previously treated with a stent (unknown type) .     Free Graft to RPDA  SVG   . Origin to Prox Graft lesion, 40% stenosed.     LIMA Graft to Dist LAD  was injected is normal in caliber.     Free Graft to 2nd Diag  SVG There is mild diffuse disease in the graft.     Free Graft to 2nd Mrg  SVG is large.   . Insertion lesion, 99% stenosed. The lesion is type C located at the major branch . Y graft to diagonal and OM. At the OM Insertion site, there was a 99% stenosis.   [name] PCI: The pre-interventional distal flow is normal (TIMI 3). Pre-stent angioplasty was performed. 2.0 x 12 mm balloon A drug-eluting stent was placed. The strut is apposed. No post-stent angioplasty was performed. Maximum pressure: 16 atm. The post-interventional distal flow is normal (TIMI 3). The intervention was successful. No complications occurred at this lesion. The stent extended across the distal graft into the native vessel,  across the anastomosis. The lesion was pre-treated several times with intracoronary adenosine since there wasfor distal embolic protection.  . Supplies used: STENT SYNERGY DES [id number]  . There is no residual stenosis post intervention.           EKG:  EKG is ordered today. The ekg ordered today demonstrates Sinus, rate 62 bpm. PVC  Recent Labs: 12/03/2014: ALT 20 12/13/2014: TSH 1.472 12/14/2014: BUN 24*; Creatinine, Ser 1.98*; Hemoglobin 13.5; Platelets 143*; Potassium 4.4; Sodium 140   Lipid Panel    Component Value Date/Time   CHOL 135 12/13/2014 0423   TRIG 124 12/13/2014 0423   HDL 30* 12/13/2014 0423   CHOLHDL 4.5 12/13/2014 0423   VLDL 25 12/13/2014 0423   LDLCALC 80 12/13/2014 0423     Wt Readings from Last 3 Encounters:  04/30/15 232 lb (105.235 kg)  04/21/15 233 lb (105.688 kg)  03/18/15 236 lb (107.049 kg)     Other studies Reviewed: Additional studies/ records that were reviewed today include: . Review of the above records demonstrates:    Assessment and Plan:   1. CAD: Stable. Continue current therapy with aspirin, Plavix, statin, beta blocker.   2. Ischemic Cardiomyopathy: Last LVEF=53% by echo 2014. Continue beta blocker. He is not on an Ace-inh due to his renal insufficiency.   3. Hypertension: Minimally elevated today but he will follow at home.  No changes today.    4. Hyperlipidemia: Continue statin. Lipids are followed in primary care.   5. CKD: Followed by Nephrology. Baseline creatinine 2.0-2.5.   6. Left leg pain: No swelling. Happens at night. He has been seen in primary care  and referred to ortho in 2 weeks. I will arrange ABI/LE dopplers in our [place] office in [place] to exclude PAD.   Current medicines are reviewed at length with the patient today.  The patient does not have concerns regarding medicines.  The following changes have been made:  no change  Labs/ tests ordered today include:   No orders of the defined types  were placed in this encounter.    Disposition:   FU with me in 6 months  Signed, Lauree Chandler, MD 04/30/2015 4:38 PM    [facility] [address], [place], [place]  [address] Phone: [phone]; Fax: [phone]

## 2015-05-08 ENCOUNTER — Telehealth: Payer: Self-pay | Admitting: Cardiovascular Disease

## 2015-05-08 NOTE — Telephone Encounter (Signed)
I will forward to Medical Records. 

## 2015-05-08 NOTE — Telephone Encounter (Signed)
Per Teri--a request for medical information should be faxed to medical records so that they can handle request for medical information, should not give information over the telephone. LM for Patrici Ranks that request should be faxed to medical records.

## 2015-05-08 NOTE — Telephone Encounter (Signed)
New message     Nurse needs to confirm diagnosis of CHF and get latest EF.  She sent the ejection fraction form to medical records but only received lab results.  She is asking for the nurse to call

## 2015-05-15 DIAGNOSIS — M7072 Other bursitis of hip, left hip: Secondary | ICD-10-CM | POA: Diagnosis not present

## 2015-05-20 ENCOUNTER — Other Ambulatory Visit: Payer: Self-pay | Admitting: Cardiovascular Disease

## 2015-05-20 ENCOUNTER — Ambulatory Visit: Payer: Medicare Other

## 2015-05-20 DIAGNOSIS — I739 Peripheral vascular disease, unspecified: Secondary | ICD-10-CM

## 2015-05-20 DIAGNOSIS — M79605 Pain in left leg: Secondary | ICD-10-CM | POA: Diagnosis not present

## 2015-05-24 ENCOUNTER — Other Ambulatory Visit: Payer: Self-pay | Admitting: Family Medicine

## 2015-05-27 ENCOUNTER — Telehealth: Payer: Self-pay | Admitting: Cardiovascular Disease

## 2015-05-27 NOTE — Telephone Encounter (Signed)
Called patient, gave info to wife.  Advised of the stable [place] results.  Was asked to have results sent to pcp.  PCP in [facility] on [address].

## 2015-05-27 NOTE — Telephone Encounter (Signed)
Follow Up  Pt called for test results. Please call back to discuss.

## 2015-05-27 NOTE — Telephone Encounter (Signed)
Called patient 05/27/15 @4 :20-  Busy signal

## 2015-05-28 DIAGNOSIS — I701 Atherosclerosis of renal artery: Secondary | ICD-10-CM | POA: Diagnosis not present

## 2015-05-28 DIAGNOSIS — E1129 Type 2 diabetes mellitus with other diabetic kidney complication: Secondary | ICD-10-CM | POA: Diagnosis not present

## 2015-05-28 DIAGNOSIS — N183 Chronic kidney disease, stage 3 (moderate): Secondary | ICD-10-CM | POA: Diagnosis not present

## 2015-05-28 DIAGNOSIS — E872 Acidosis: Secondary | ICD-10-CM | POA: Diagnosis not present

## 2015-05-28 DIAGNOSIS — I10 Essential (primary) hypertension: Secondary | ICD-10-CM | POA: Diagnosis not present

## 2015-06-04 DIAGNOSIS — E1129 Type 2 diabetes mellitus with other diabetic kidney complication: Secondary | ICD-10-CM | POA: Diagnosis not present

## 2015-06-04 DIAGNOSIS — N183 Chronic kidney disease, stage 3 (moderate): Secondary | ICD-10-CM | POA: Diagnosis not present

## 2015-06-04 DIAGNOSIS — I10 Essential (primary) hypertension: Secondary | ICD-10-CM | POA: Diagnosis not present

## 2015-06-16 DIAGNOSIS — M7062 Trochanteric bursitis, left hip: Secondary | ICD-10-CM | POA: Diagnosis not present

## 2015-07-21 ENCOUNTER — Other Ambulatory Visit: Payer: Self-pay | Admitting: Family Medicine

## 2015-07-24 ENCOUNTER — Other Ambulatory Visit: Payer: Self-pay | Admitting: Family Medicine

## 2015-08-19 ENCOUNTER — Ambulatory Visit (INDEPENDENT_AMBULATORY_CARE_PROVIDER_SITE_OTHER): Payer: Medicare Other | Admitting: Family Medicine

## 2015-08-19 ENCOUNTER — Encounter: Payer: Self-pay | Admitting: Family Medicine

## 2015-08-19 VITALS — BP 136/72 | HR 62 | Temp 98.2°F | Resp 16 | Wt 236.0 lb

## 2015-08-19 DIAGNOSIS — J309 Allergic rhinitis, unspecified: Secondary | ICD-10-CM | POA: Insufficient documentation

## 2015-08-19 DIAGNOSIS — K219 Gastro-esophageal reflux disease without esophagitis: Secondary | ICD-10-CM

## 2015-08-19 DIAGNOSIS — E1121 Type 2 diabetes mellitus with diabetic nephropathy: Secondary | ICD-10-CM

## 2015-08-19 DIAGNOSIS — I150 Renovascular hypertension: Secondary | ICD-10-CM | POA: Diagnosis not present

## 2015-08-19 DIAGNOSIS — E785 Hyperlipidemia, unspecified: Secondary | ICD-10-CM

## 2015-08-19 LAB — POCT GLYCOSYLATED HEMOGLOBIN (HGB A1C): Hemoglobin A1C: 5.8

## 2015-08-19 NOTE — Progress Notes (Signed)
Patient ID: Ronald [name], male   DOB: [birth year], 70 y.o.   MRN: [id number]    Subjective:  HPI  Diabetes Mellitus Type II, Follow-up:   Lab Results  Component Value Date   HGBA1C 5.8 08/19/2015   HGBA1C 5.7 04/21/2015   HGBA1C 6.2* 12/13/2014    Last seen for diabetes 4 months ago.  Management since then includes none. He reports good compliance with treatment. He is not having side effects.  Current symptoms include none Home blood sugar records: not being checked.  Episodes of hypoglycemia? no    Pertinent Labs:    Component Value Date/Time   CHOL 135 12/13/2014 0423   TRIG 124 12/13/2014 0423   HDL 30* 12/13/2014 0423   LDLCALC 80 12/13/2014 0423   CREATININE 1.98* 12/14/2014 0220   CREATININE 2.0* 03/06/2014    Wt Readings from Last 3 Encounters:  08/19/15 236 lb (107.049 kg)  04/30/15 232 lb (105.235 kg)  04/21/15 233 lb (105.688 kg)    ------------------------------------------------------------------------   Hypertension, follow-up:  BP Readings from Last 3 Encounters:  08/19/15 136/72  04/30/15 150/70  04/21/15 152/74    He was last seen for hypertension 4 months ago.  BP at that visit was 150/70. Management since that visit includes none. He reports good compliance with treatment. He is not having side effects.  He is not exercising. He is adherent to low salt diet.   Outside blood pressures are not being checked. He is experiencing none.  Patient denies chest pain, chest pressure/discomfort, claudication, dyspnea, exertional chest pressure/discomfort, fatigue, irregular heart beat, lower extremity edema, near-syncope and orthopnea.    Wt Readings from Last 3 Encounters:  08/19/15 236 lb (107.049 kg)  04/30/15 232 lb (105.235 kg)  04/21/15 233 lb (105.688 kg)    ------------------------------------------------------------------------  Pt would like to know if he can take Claritin D for his runny nose and post nasal  drainage.    Prior to Admission medications   Medication Sig Start Date End Date Taking? Authorizing Provider  acetaminophen (TYLENOL) 325 MG tablet Take 650 mg by mouth every 6 (six) hours as needed.   Yes Historical Provider, MD  allopurinol (ZYLOPRIM) 100 MG tablet Take 1 tablet by mouth at  bedtime 07/21/15  Yes Richard Maceo Pro., MD  amLODipine (NORVASC) 5 MG tablet 1 TABLET, ORAL, DAILY 01/01/15  Yes [name]., MD  aspirin 81 MG tablet Take 81 mg by mouth every morning.    Yes Historical Provider, MD  atorvastatin (LIPITOR) 10 MG tablet Take 1 tablet by mouth  daily 07/24/15  Yes Richard Maceo Pro., MD  carvedilol (COREG) 12.5 MG tablet TAKE 1 TABLET BY MOUTH 2  TIMES DAILY WITH A MEAL 07/21/15  Yes [name]., MD  clopidogrel (PLAVIX) 75 MG tablet Take 1 tablet (75 mg total) by mouth daily. 03/20/15  Yes Burnell Blanks, MD  magnesium oxide (MAG-OX) 400 (241.3 MG) MG tablet TAKE 1 TABLET BY MOUTH TWO TIMES DAILY. 05/27/15  Yes Richard Maceo Pro., MD  magnesium oxide (MAG-OX) 400 MG tablet Take 400 mg by mouth 2 (two) times daily.     Yes Historical Provider, MD  Multiple Vitamin (MULTIVITAMIN) tablet Take 1 tablet by mouth every morning.    Yes Historical Provider, MD  nitroGLYCERIN (NITROSTAT) 0.4 MG SL tablet Place 1 tablet (0.4 mg total) under the tongue every 5 (five) minutes as needed. May repeat up to three times. 10/22/14  Yes Burnell Blanks,  MD  omeprazole (PRILOSEC) 20 MG capsule Take 1 capsule (20 mg total) by mouth 2 (two) times daily. 11/13/14  Yes Richard Maceo Pro., MD  sodium bicarbonate 650 MG tablet Take 650 mg by mouth 2 (two) times daily.   Yes Historical Provider, MD    Patient Active Problem List   Diagnosis Date Noted  . Allergic rhinitis 08/19/2015  . NSTEMI (non-ST elevated myocardial infarction) ([place]) 12/13/2014  . Absolute anemia 10/04/2014  . AA (aortic aneurysm) ([place]) 10/04/2014  . Benign enlargement of prostate  10/04/2014  . Arteriosclerosis of coronary artery 10/04/2014  . Diabetes mellitus, type 2 ([place]) 10/04/2014  . Acid reflux 10/04/2014  . 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 HLD (hyperlipidemia) 10/04/2014  . BP (high blood pressure) 10/04/2014  . Osteoarthrosis 10/04/2014  . Adult BMI 30+ 10/04/2014  . 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 Chest pain, atypical 12/30/2012  . Urge incontinence of urine 10/24/2012  . Chronic kidney disease (CKD), stage IV (severe) ([place])   . Coronary artery disease 11/09/2010  . Abdominal pain 11/09/2010  . Shortness of breath 11/12/2009  . Renal artery stenosis    . Mixed hyperlipidemia 09/04/2008  . History of redo bypass grafting 09/04/2008  . Diabetes mellitus with nephropathy [facility])     Past Medical History  Diagnosis Date  . CAD (coronary artery disease) 2007    a. s/p CABG- IMA-LAD, VG-Cx, VG-RCA, VG-diag in 1999. B. sp redo CABG- VG-OM, VG-RCA in 2007 due to VG disease. c. NSTEMI 11/2014 s/p DES to SVG-OM from the Y graft.  . Ischemic cardiomyopathy 2006    EF 40% to 50% by 2D echo in 2006;  Echo 12/31/12: Mild LVH, EF 50-55%, normal wall motion.   . CKD (chronic kidney disease), stage IV (HCC)     Creatinine 1.9 to 2.2  . Hyperlipidemia   . Hypertension   . Renal artery stenosis ([place])   . Emphysema   . COPD (chronic obstructive pulmonary disease) ([place])   . GERD (gastroesophageal reflux disease)   . Esophageal stricture 07/02/1998    EGD  . Dysrhythmia     bigeminy pvcs  . CHF (congestive heart failure) ([place])   . History of hiatal hernia   . Walking pneumonia 1990's  . Type II diabetes mellitus (HCC)     Diet control   . History of gout     "last flareup was in 2007" (12/13/2014)  . Chronic lower back pain   . Arthritis     "hips; back" (12/13/2014)  . Degenerative disc disease, lumbar   . Deafness in left ear     Social History   Social History  . Marital Status: Married    Spouse  Name: N/A  . Number of Children: 2  . Years of Education: N/A   Occupational History  . Retired    Social History Main Topics  . Smoking status: Former Smoker -- 3.00 packs/day for 20 years    Types: Cigarettes    Quit date: 07/25/1986  . Smokeless tobacco: Never Used  . Alcohol Use: No  . Drug Use: No  . Sexual Activity: Not Currently   Other Topics Concern  . 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    Allergies  Allergen Reactions  . Ciprofloxacin     GI upset  . Hydrochlorothiazide Other (See Comments)    dehydrates Dehydration  . Hydrocodone  Stomach upset Stomach upset  . Hydrocodone-Acetaminophen     Stomach upset  . Hydrocodone-Acetaminophen Nausea Only  . Sulfa Antibiotics   . Sulfacetamide Sodium   . Penicillins Hives and Rash    Review of Systems  Constitutional: Negative.   HENT: Negative.        Runny nose and post nasal drainage.  Eyes: Negative.   Respiratory: Negative.   Cardiovascular: Negative.   Gastrointestinal: Negative.   Genitourinary: Negative.   Musculoskeletal: Negative.   Skin: Negative.   Neurological: Negative.   Endo/Heme/Allergies: Negative.   Psychiatric/Behavioral: Negative.     Immunization History  Administered Date(s) Administered  . Influenza, High Dose Seasonal PF 03/18/2015  . Pneumococcal Conjugate-13 12/25/2013  . Pneumococcal Polysaccharide-23 03/04/2009  . Td 10/10/2003   Objective:  BP 136/72 mmHg  Pulse 62  Temp(Src) 98.2 F (36.8 C) (Oral)  Resp 16  Wt 236 lb (107.049 kg)  Physical Exam  Constitutional: He is oriented to person, place, and time and well-developed, well-nourished, and in no distress.  HENT:  Head: Normocephalic and atraumatic.  Right Ear: External ear normal.  Left Ear: External ear normal.  Nose: Nose normal.  Eyes: Conjunctivae and EOM are normal. Pupils are equal, round, and reactive to light.  Neck: Normal range of motion. Neck supple.   Cardiovascular: Normal rate, normal heart sounds and intact distal pulses.   Pulmonary/Chest: Effort normal and breath sounds normal.  Abdominal: Soft. Bowel sounds are normal.  Musculoskeletal: Normal range of motion.  Neurological: He is alert and oriented to person, place, and time. He has normal reflexes. Gait normal. GCS score is 15.  Skin: Skin is warm and dry.  Psychiatric: Mood, memory, affect and judgment normal.    Lab Results  Component Value Date   WBC 7.6 12/14/2014   HGB 13.5 12/14/2014   HCT 40.6 12/14/2014   PLT 143* 12/14/2014   GLUCOSE 156* 12/14/2014   CHOL 135 12/13/2014   TRIG 124 12/13/2014   HDL 30* 12/13/2014   LDLCALC 80 12/13/2014   TSH 1.472 12/13/2014   PSA 0.8 09/13/2013   INR 1.04 12/13/2014   HGBA1C 5.8 08/19/2015    CMP     Component Value Date/Time   NA 140 12/14/2014 0220   NA 140 12/03/2014 1611   K 4.4 12/14/2014 0220   CL 111 12/14/2014 0220   CO2 22 12/14/2014 0220   GLUCOSE 156* 12/14/2014 0220   GLUCOSE 77 12/03/2014 1611   GLUCOSE 82 06/13/2006 1621   BUN 24* 12/14/2014 0220   BUN 32* 12/03/2014 1611   CREATININE 1.98* 12/14/2014 0220   CREATININE 2.0* 03/06/2014   CALCIUM 9.0 12/14/2014 0220   PROT 6.5 12/03/2014 1611   PROT 6.5 12/30/2012 1532   ALBUMIN 4.6 12/03/2014 1611   ALBUMIN 3.9 12/30/2012 1532   AST 19 12/03/2014 1611   ALT 20 12/03/2014 1611   ALKPHOS 175* 12/03/2014 1611   BILITOT 1.0 12/03/2014 1611   BILITOT 1.1 12/30/2012 1532   GFRNONAA 32* 12/14/2014 0220   GFRAA 37* 12/14/2014 0220    Assessment and Plan :  1. Type 2 diabetes mellitus with diabetic nephropathy, unspecified long term insulin use status (HCC)/no insulin use  - POCT HgB A1C 5.8 today.   2. Gastroesophageal reflux disease without esophagitis  3. Renovascular hypertension   4. HLD (hyperlipidemia)   5. Allergic rhinitis, unspecified allergic rhinitis type Try OTC mucinex and Claritin.   Patient was seen and examined by Dr.  Miguel Aschoff, and noted scribed  by Webb Laws, CMA I have done the exam and reviewed the above chart and it is accurate to the best of my knowledge.  Miguel Aschoff MD [facility] 08/19/2015 4:31 PM

## 2015-08-19 NOTE — Patient Instructions (Addendum)
Add guaifenesin or robitussin and Claritin for allergies.

## 2015-09-11 LAB — HM DIABETES EYE EXAM

## 2015-09-25 DIAGNOSIS — E1129 Type 2 diabetes mellitus with other diabetic kidney complication: Secondary | ICD-10-CM | POA: Diagnosis not present

## 2015-09-25 DIAGNOSIS — I10 Essential (primary) hypertension: Secondary | ICD-10-CM | POA: Diagnosis not present

## 2015-09-25 DIAGNOSIS — E872 Acidosis: Secondary | ICD-10-CM | POA: Diagnosis not present

## 2015-09-25 DIAGNOSIS — I701 Atherosclerosis of renal artery: Secondary | ICD-10-CM | POA: Diagnosis not present

## 2015-09-25 DIAGNOSIS — N183 Chronic kidney disease, stage 3 (moderate): Secondary | ICD-10-CM | POA: Diagnosis not present

## 2015-09-26 LAB — CBC AND DIFFERENTIAL
HCT: 40 % — AB (ref 41–53)
Hemoglobin: 12.8 g/dL — AB (ref 13.5–17.5)
Neutrophils Absolute: 4 /uL
Platelets: 151 K/µL (ref 150–399)
WBC: 6.3 10*3/mL

## 2015-09-26 LAB — BASIC METABOLIC PANEL WITH GFR
BUN: 28 mg/dL — AB (ref 4–21)
Creatinine: 1.8 mg/dL — AB (ref 0.6–1.3)
Glucose: 169 mg/dL
Potassium: 4.5 mmol/L (ref 3.4–5.3)
Sodium: 142 mmol/L (ref 137–147)

## 2015-09-26 LAB — HEMOGLOBIN A1C: Hemoglobin A1C: 6.2

## 2015-09-27 ENCOUNTER — Other Ambulatory Visit: Payer: Self-pay | Admitting: Family Medicine

## 2015-09-27 NOTE — Telephone Encounter (Signed)
Need to change omeprazole to pantoprazole due to patient being on Plavix.

## 2015-09-29 MED ORDER — PANTOPRAZOLE SODIUM 40 MG PO TBEC: 40.0000 mg | DELAYED_RELEASE_TABLET | Freq: Every day | ORAL | Status: DC

## 2015-09-29 NOTE — Addendum Note (Signed)
Addended by: Arnette Norris on: 09/29/2015 08:29 AM   Modules accepted: Orders, Medications

## 2015-10-02 DIAGNOSIS — I129 Hypertensive chronic kidney disease with stage 1 through stage 4 chronic kidney disease, or unspecified chronic kidney disease: Secondary | ICD-10-CM | POA: Diagnosis not present

## 2015-10-02 DIAGNOSIS — I701 Atherosclerosis of renal artery: Secondary | ICD-10-CM | POA: Diagnosis not present

## 2015-10-02 DIAGNOSIS — R079 Chest pain, unspecified: Secondary | ICD-10-CM | POA: Diagnosis not present

## 2015-10-02 DIAGNOSIS — E1129 Type 2 diabetes mellitus with other diabetic kidney complication: Secondary | ICD-10-CM | POA: Diagnosis not present

## 2015-10-02 DIAGNOSIS — N183 Chronic kidney disease, stage 3 (moderate): Secondary | ICD-10-CM | POA: Diagnosis not present

## 2015-10-03 ENCOUNTER — Ambulatory Visit (INDEPENDENT_AMBULATORY_CARE_PROVIDER_SITE_OTHER): Payer: Medicare Other | Admitting: Physician Assistant

## 2015-10-03 ENCOUNTER — Telehealth: Payer: Self-pay | Admitting: Cardiovascular Disease

## 2015-10-03 ENCOUNTER — Encounter: Payer: Self-pay | Admitting: Physician Assistant

## 2015-10-03 VITALS — BP 142/70 | HR 72 | Temp 97.6°F | Resp 16 | Wt 233.0 lb

## 2015-10-03 DIAGNOSIS — R079 Chest pain, unspecified: Secondary | ICD-10-CM

## 2015-10-03 NOTE — Patient Instructions (Signed)
Angina Pectoris Angina pectoris, often called angina, is extreme discomfort in the chest, neck, or arm. This is caused by a lack of blood in the middle and thickest layer of the heart wall (myocardium). There are four types of angina:  Stable angina. Stable angina usually occurs in episodes of predictable frequency and duration. It is usually brought on by physical activity, stress, or excitement. Stable angina usually lasts a few minutes and can often be relieved by a medicine that you place under your tongue. This medicine is called sublingual nitroglycerin.  Unstable angina. Unstable angina can occur even when you are doing little or no physical activity. It can even occur while you are sleeping or when you are at rest. It can suddenly increase in severity or frequency. It may not be relieved by sublingual nitroglycerin, and it can last up to 30 minutes.  Microvascular angina. This type of angina is caused by a disorder of tiny blood vessels called arterioles. Microvascular angina is more common in women. The pain may be more severe and last longer than other types of angina pectoris.  Prinzmetal or variant angina. This type of angina pectoris is rare and usually occurs when you are doing little or no physical activity. It especially occurs in the early morning hours. CAUSES Atherosclerosis is the cause of angina. This is the buildup of fat and cholesterol (plaque) on the inside of the arteries. Over time, the plaque may narrow or block the artery, and this will lessen blood flow to the heart. Plaque can also become weak and break off within a coronary artery to form a clot and cause a sudden blockage. RISK FACTORS Risk factors common to both men and women include:  High cholesterol levels.  High blood pressure (hypertension).  Tobacco use.  Diabetes.  Family history of angina.  Obesity.  Lack of exercise.  A diet high in saturated fats. Women are at greater risk for angina if they  are:  Over age 9.  Postmenopausal. SYMPTOMS Many people do not experience any symptoms during the early stages of angina. As the condition progresses, symptoms common to both men and women may include:  Chest pain.  The pain can be described as a crushing or squeezing in the chest, or a tightness, pressure, fullness, or heaviness in the chest.  The pain can last more than a few minutes, or it can stop and recur.  Pain in the arms, neck, jaw, or back.  Unexplained heartburn or indigestion.  Shortness of breath.  Nausea.  Sudden cold sweats.  Sudden light-headedness. Many women have chest discomfort and some of the other symptoms. However, women often have different (atypical) symptoms, such as:   Fatigue.  Unexplained feelings of nervousness or anxiety.  Unexplained weakness.  Dizziness or fainting. Sometimes, women may have angina without any symptoms. DIAGNOSIS  Tests to diagnose angina may include:  ECG (electrocardiogram).  Exercise stress test. This looks for signs of blockage when the heart is being exercised.  Pharmacologic stress test. This test looks for signs of blockage when the heart is being stressed with a medicine.  Blood tests.  Coronary angiogram. This is a procedure to look at the coronary arteries to see if there is any blockage. TREATMENT  The treatment of angina may include the following:  Healthy behavioral changes to reduce or control risk factors.  Medicine.  Coronary stenting.A stent helps to keep an artery open.  Coronary angioplasty. This procedure widens a narrowed or blocked artery.  Coronary arterybypass  surgery. This will allow your blood to pass the blockage (bypass) to reach your heart. HOME CARE INSTRUCTIONS   Take medicines only as directed by your health care provider.  Do not take the following medicines unless your health care provider approves:  Nonsteroidal anti-inflammatory drugs (NSAIDs), such as ibuprofen,  naproxen, or celecoxib.  Vitamin supplements that contain vitamin A, vitamin E, or both.  Hormone replacement therapy that contains estrogen with or without progestin.  Manage other health conditions such as hypertension and diabetes as directed by your health care provider.  Follow a heart-healthy diet. A dietitian can help to educate you about healthy food options and changes.  Use healthy cooking methods such as roasting, grilling, broiling, baking, poaching, steaming, or stir-frying. Talk to a dietitian to learn more about healthy cooking methods.  Follow an exercise program approved by your health care provider.  Maintain a healthy weight. Lose weight as approved by your health care provider.  Plan rest periods when fatigued.  Learn to manage stress.  Do not use any tobacco products, including cigarettes, chewing tobacco, or electronic cigarettes. If you need help quitting, ask your health care provider.  If you drink alcohol, and your health care provider approves, limit your alcohol intake to no more than 1 drink per day. One drink equals 12 ounces of beer, 5 ounces of wine, or 1 ounces of hard liquor.  Stop illegal drug use.  Keep all follow-up visits as directed by your health care provider. This is important. SEEK IMMEDIATE MEDICAL CARE IF:   You have pain in your chest, neck, arm, jaw, stomach, or back that lasts more than a few minutes, is recurring, or is unrelieved by taking sublingualnitroglycerin.  You have profuse sweating without cause.  You have unexplained:  Heartburn or indigestion.  Shortness of breath or difficulty breathing.  Nausea or vomiting.  Fatigue.  Feelings of nervousness or anxiety.  Weakness.  Diarrhea.  You have sudden light-headedness or dizziness.  You faint. These symptoms may represent a serious problem that is an emergency. Do not wait to see if the symptoms will go away. Get medical help right away. Call your local  emergency services (911 in the [place] Do not drive yourself to the hospital.   This information is not intended to replace advice given to you by your health care provider. Make sure you discuss any questions you have with your health care provider.   Document Released: 05/17/2005 Document Revised: 06/07/2014 Document Reviewed: 09/18/2013 Elsevier Interactive Patient Education [employer].

## 2015-10-03 NOTE — Telephone Encounter (Signed)
New Message  Pt c/o of Chest Pain: 1. Are you having CP right now? NO   2. Are you experiencing any other symptoms (ex. SOB, nausea, vomiting, sweating)? Not right now ( had an episode last night)  3. How long have you been experiencing CP? Just last night.  4. Is your CP continuous or coming and going? Just one intense episode  5. Have you taken Nitroglycerin? No he took 4 baby Asprin's. After he took those the pain drifted away    Comments: chest pain arm pain and dizziness. Called EMS didn't got to the ER. Seen PCP today who advised to see Cardiologist

## 2015-10-03 NOTE — Progress Notes (Signed)
[name], male   DOB: 04/08/1946, 70 y.o.   MRN: [id number]       Patient: Ronald [name] [facility] Male    DOB: 06-Apr-1946   70 y.o.   MRN: [id number] Visit Date: 10/03/2015  Today's Provider: Mar Daring, PA-C   Chief Complaint  Patient presents with  . Chest Pain   Subjective:    HPI Pt reports that he started having chest pain yesterday evening. Pain located in the middle of his chest. He reports that he was just sitting down when he started having the pain. He got up to go to another room and it got worse. He them told his wife to take him the ER but then said to call EMS. EMS ran EKG and told him it was ok but to follow up with PCP and to take 4 baby ASA. He reports that the pain got better after he took them. He has not had any further chest pain today. Wife reports that he did a lot of yard work the day prior to this episode and came in the house exhausted. He also report that he has been breaking out in cold sweats, increased shortness of breath and lightheadedness.     Allergies  Allergen Reactions  . Ciprofloxacin     GI upset  . Hydrochlorothiazide Other (See Comments)    dehydrates Dehydration  . Hydrocodone     Stomach upset Stomach upset  . Hydrocodone-Acetaminophen     Stomach upset  . Hydrocodone-Acetaminophen Nausea Only  . Sulfa Antibiotics   . Sulfacetamide Sodium   . Penicillins Hives and Rash   Previous Medications   ACETAMINOPHEN (TYLENOL) 325 MG TABLET    Take 650 mg by mouth every 6 (six) hours as needed.   ALLOPURINOL (ZYLOPRIM) 100 MG TABLET    Take 1 tablet by mouth at  bedtime   AMLODIPINE (NORVASC) 5 MG TABLET    1 TABLET, ORAL, DAILY   ASPIRIN 81 MG TABLET    Take 81 mg by mouth every morning.    ATORVASTATIN (LIPITOR) 10 MG TABLET    Take 1 tablet by mouth  daily   CARVEDILOL (COREG) 12.5 MG TABLET    TAKE 1 TABLET BY MOUTH 2  TIMES DAILY WITH A MEAL   CLOPIDOGREL (PLAVIX) 75 MG TABLET    Take 1 tablet (75 mg total) by mouth  daily.   MAGNESIUM OXIDE (MAG-OX) 400 (241.3 MG) MG TABLET    TAKE 1 TABLET BY MOUTH TWO TIMES DAILY.   MULTIPLE VITAMIN (MULTIVITAMIN) TABLET    Take 1 tablet by mouth every morning.    NITROGLYCERIN (NITROSTAT) 0.4 MG SL TABLET    Place 1 tablet (0.4 mg total) under the tongue every 5 (five) minutes as needed. May repeat up to three times.   OMEPRAZOLE (PRILOSEC) 20 MG CAPSULE       SODIUM BICARBONATE 650 MG TABLET    Take 650 mg by mouth 2 (two) times daily.    Review of Systems  Constitutional: Positive for diaphoresis and fatigue.  HENT: Negative.   Eyes: Negative.   Respiratory: Positive for shortness of breath.   Cardiovascular: Positive for chest pain.  Gastrointestinal: Negative.   Endocrine: Negative.   Genitourinary: Negative.   Musculoskeletal: Negative.   Skin: Negative.   Allergic/Immunologic: Negative.   Neurological: Positive for dizziness.  Hematological: Negative.   Psychiatric/Behavioral: Negative.     Social History  Substance Use Topics  . Smoking status: Former Smoker --  3.00 packs/day for 20 years    Types: Cigarettes    Quit date: 07/25/1986  . Smokeless tobacco: Never Used  . Alcohol Use: No   Objective:   BP 142/70 mmHg  Pulse 72  Temp(Src) 97.6 F (36.4 C) (Oral)  Resp 16  Wt 233 lb (105.688 kg)  SpO2 98%  Physical Exam  Constitutional: He appears well-developed and well-nourished. No distress.  HENT:  Head: Normocephalic and atraumatic.  Neck: Normal range of motion. Neck supple. No JVD present. No tracheal deviation present. No thyromegaly present.  Cardiovascular: Normal rate, regular rhythm and normal heart sounds.  Exam reveals no gallop and no friction rub.   No murmur heard. Pulmonary/Chest: Effort normal and breath sounds normal. No respiratory distress. He has no wheezes. He has no rales.  Musculoskeletal: He exhibits no edema.  Lymphadenopathy:    He has no cervical adenopathy.  Skin: He is not diaphoretic.  Vitals  reviewed.       Assessment & Plan:     1. Chest pain, unspecified chest pain type EKG today was slightly improved from an EKG on 12/18/14. BP stable. No ongoing chest pressure or pain. He does have ASA and nitroglycerin if needed at home. Advised if symptoms onset again to call 9-1-1. He is followed by Dr. Angelena Form with [facility]. He was last seen in 04/2015 and was to f/u in 6 months. No appointment currently. Advised him and his wife to call and try to get f/u appointment. I have also sent a message to Dr. Angelena Form via [facility] requesting follow up and informing him of the most current episode.  - EKG 12-Lead       Mar Daring, PA-C  [facility]

## 2015-10-03 NOTE — Telephone Encounter (Signed)
Spoke with pt's wife and told her note from primary care had been reviewed.  She reports pt is not having chest pain at this time.  Appt made for pt to see Ermalinda Barrios, PA on 10/06/15 at 12:15.  I told pt's wife if pt had problems prior to this appt he should go to ED.

## 2015-10-06 ENCOUNTER — Encounter: Payer: Self-pay | Admitting: Physician Assistant

## 2015-10-06 ENCOUNTER — Other Ambulatory Visit: Payer: Self-pay | Admitting: Physician Assistant

## 2015-10-06 ENCOUNTER — Encounter (HOSPITAL_COMMUNITY): Payer: Self-pay

## 2015-10-06 ENCOUNTER — Ambulatory Visit (INDEPENDENT_AMBULATORY_CARE_PROVIDER_SITE_OTHER): Payer: Medicare Other | Admitting: Physician Assistant

## 2015-10-06 ENCOUNTER — Inpatient Hospital Stay (HOSPITAL_COMMUNITY)
Admission: AD | Admit: 2015-10-06 | Discharge: 2015-10-08 | DRG: 247 | Disposition: A | Discharge status: Home / Self Care | Payer: Medicare Other | Source: Ambulatory Visit | Attending: Interventional Cardiology | Admitting: Interventional Cardiology

## 2015-10-06 VITALS — BP 144/76 | HR 66 | Ht 70.0 in | Wt 233.0 lb

## 2015-10-06 DIAGNOSIS — I25110 Atherosclerotic heart disease of native coronary artery with unstable angina pectoris: Secondary | ICD-10-CM | POA: Diagnosis not present

## 2015-10-06 DIAGNOSIS — I25119 Atherosclerotic heart disease of native coronary artery with unspecified angina pectoris: Secondary | ICD-10-CM | POA: Insufficient documentation

## 2015-10-06 DIAGNOSIS — Z79899 Other long term (current) drug therapy: Secondary | ICD-10-CM | POA: Diagnosis not present

## 2015-10-06 DIAGNOSIS — I1310 Hypertensive heart and chronic kidney disease without heart failure, with stage 1 through stage 4 chronic kidney disease, or unspecified chronic kidney disease: Secondary | ICD-10-CM | POA: Diagnosis present

## 2015-10-06 DIAGNOSIS — Z87891 Personal history of nicotine dependence: Secondary | ICD-10-CM | POA: Diagnosis not present

## 2015-10-06 DIAGNOSIS — N184 Chronic kidney disease, stage 4 (severe): Secondary | ICD-10-CM | POA: Diagnosis not present

## 2015-10-06 DIAGNOSIS — I701 Atherosclerosis of renal artery: Secondary | ICD-10-CM | POA: Diagnosis not present

## 2015-10-06 DIAGNOSIS — J449 Chronic obstructive pulmonary disease, unspecified: Secondary | ICD-10-CM | POA: Diagnosis not present

## 2015-10-06 DIAGNOSIS — I119 Hypertensive heart disease without heart failure: Secondary | ICD-10-CM | POA: Diagnosis present

## 2015-10-06 DIAGNOSIS — I200 Unstable angina: Secondary | ICD-10-CM

## 2015-10-06 DIAGNOSIS — Z7902 Long term (current) use of antithrombotics/antiplatelets: Secondary | ICD-10-CM

## 2015-10-06 DIAGNOSIS — I420 Dilated cardiomyopathy: Secondary | ICD-10-CM

## 2015-10-06 DIAGNOSIS — K219 Gastro-esophageal reflux disease without esophagitis: Secondary | ICD-10-CM | POA: Diagnosis not present

## 2015-10-06 DIAGNOSIS — I2510 Atherosclerotic heart disease of native coronary artery without angina pectoris: Secondary | ICD-10-CM | POA: Insufficient documentation

## 2015-10-06 DIAGNOSIS — N183 Chronic kidney disease, stage 3 unspecified: Secondary | ICD-10-CM

## 2015-10-06 DIAGNOSIS — Z7982 Long term (current) use of aspirin: Secondary | ICD-10-CM | POA: Diagnosis not present

## 2015-10-06 DIAGNOSIS — N179 Acute kidney failure, unspecified: Secondary | ICD-10-CM | POA: Diagnosis not present

## 2015-10-06 DIAGNOSIS — Z955 Presence of coronary angioplasty implant and graft: Secondary | ICD-10-CM | POA: Diagnosis not present

## 2015-10-06 DIAGNOSIS — R079 Chest pain, unspecified: Secondary | ICD-10-CM | POA: Diagnosis not present

## 2015-10-06 DIAGNOSIS — T82858A Stenosis of vascular prosthetic devices, implants and grafts, initial encounter: Secondary | ICD-10-CM | POA: Diagnosis not present

## 2015-10-06 DIAGNOSIS — I252 Old myocardial infarction: Secondary | ICD-10-CM | POA: Diagnosis not present

## 2015-10-06 DIAGNOSIS — Z8249 Family history of ischemic heart disease and other diseases of the circulatory system: Secondary | ICD-10-CM

## 2015-10-06 DIAGNOSIS — E785 Hyperlipidemia, unspecified: Secondary | ICD-10-CM | POA: Diagnosis present

## 2015-10-06 DIAGNOSIS — I25799 Atherosclerosis of other coronary artery bypass graft(s) with unspecified angina pectoris: Secondary | ICD-10-CM | POA: Diagnosis not present

## 2015-10-06 DIAGNOSIS — Y838 Other surgical procedures as the cause of abnormal reaction of the patient, or of later complication, without mention of misadventure at the time of the procedure: Secondary | ICD-10-CM | POA: Diagnosis present

## 2015-10-06 DIAGNOSIS — E1122 Type 2 diabetes mellitus with diabetic chronic kidney disease: Secondary | ICD-10-CM | POA: Diagnosis not present

## 2015-10-06 DIAGNOSIS — I25810 Atherosclerosis of coronary artery bypass graft(s) without angina pectoris: Secondary | ICD-10-CM | POA: Diagnosis not present

## 2015-10-06 HISTORY — DX: Dilated cardiomyopathy: I42.0

## 2015-10-06 LAB — COMPREHENSIVE METABOLIC PANEL
ALBUMIN: 3.8 g/dL (ref 3.5–5.0)
ALT: 16 U/L — ABNORMAL LOW (ref 17–63)
ANION GAP: 8 (ref 5–15)
AST: 19 U/L (ref 15–41)
Alkaline Phosphatase: 151 U/L — ABNORMAL HIGH (ref 38–126)
BILIRUBIN TOTAL: 1.1 mg/dL (ref 0.3–1.2)
BUN: 27 mg/dL — ABNORMAL HIGH (ref 6–20)
CHLORIDE: 108 mmol/L (ref 101–111)
CO2: 26 mmol/L (ref 22–32)
Calcium: 9.4 mg/dL (ref 8.9–10.3)
Creatinine, Ser: 2.07 mg/dL — ABNORMAL HIGH (ref 0.61–1.24)
GFR calc Af Amer: 35 mL/min — ABNORMAL LOW (ref >60)
GFR, EST NON AFRICAN AMERICAN: 31 mL/min — AB (ref >60)
GLUCOSE: 86 mg/dL (ref 65–99)
POTASSIUM: 5.1 mmol/L (ref 3.5–5.1)
Sodium: 142 mmol/L (ref 135–145)
TOTAL PROTEIN: 6.5 g/dL (ref 6.5–8.1)

## 2015-10-06 LAB — TSH: TSH: 2.212 u[IU]/mL (ref 0.350–4.500)

## 2015-10-06 LAB — PROTIME-INR
INR: 0.99 (ref 0.00–1.49)
Prothrombin Time: 13.3 seconds (ref 11.6–15.2)

## 2015-10-06 LAB — CBC WITH DIFFERENTIAL/PLATELET
BASOS ABS: 0.1 10*3/uL (ref 0.0–0.1)
BASOS PCT: 1 %
EOS ABS: 0.3 10*3/uL (ref 0.0–0.7)
Eosinophils Relative: 3 %
HEMATOCRIT: 41.7 % (ref 39.0–52.0)
Hemoglobin: 13.8 g/dL (ref 13.0–17.0)
Lymphocytes Relative: 22 %
Lymphs Abs: 1.7 10*3/uL (ref 0.7–4.0)
MCH: 29.1 pg (ref 26.0–34.0)
MCHC: 33.1 g/dL (ref 30.0–36.0)
MCV: 87.8 fL (ref 78.0–100.0)
MONO ABS: 0.7 10*3/uL (ref 0.1–1.0)
MONOS PCT: 9 %
NEUTROS ABS: 5.2 10*3/uL (ref 1.7–7.7)
Neutrophils Relative %: 65 %
PLATELETS: 167 10*3/uL (ref 150–400)
RBC: 4.75 MIL/uL (ref 4.22–5.81)
RDW: 14.2 % (ref 11.5–15.5)
WBC: 7.9 10*3/uL (ref 4.0–10.5)

## 2015-10-06 LAB — HEPARIN LEVEL (UNFRACTIONATED): Heparin Unfractionated: 0.37 IU/mL (ref 0.30–0.70)

## 2015-10-06 LAB — MAGNESIUM: MAGNESIUM: 2.5 mg/dL — AB (ref 1.7–2.4)

## 2015-10-06 LAB — APTT: APTT: 30 s (ref 24–37)

## 2015-10-06 LAB — TROPONIN I
TROPONIN I: 0.03 ng/mL (ref <0.031)
TROPONIN I: 0.03 ng/mL (ref <0.031)

## 2015-10-06 MED ORDER — ASPIRIN EC 81 MG PO TBEC
81.0000 mg | DELAYED_RELEASE_TABLET | Freq: Every day | ORAL | Status: DC
  Administered 2015-10-07: 81 mg via ORAL
  Filled 2015-10-06: qty 1

## 2015-10-06 MED ORDER — ASPIRIN 81 MG PO CHEW: 324.0000 mg | CHEWABLE_TABLET | ORAL | Status: DC

## 2015-10-06 MED ORDER — SODIUM CHLORIDE 0.9 % IV SOLN: 250.0000 mL | INTRAVENOUS | Status: DC | PRN

## 2015-10-06 MED ORDER — HEPARIN (PORCINE) IN NACL 100-0.45 UNIT/ML-% IJ SOLN
1300.0000 [IU]/h | INTRAMUSCULAR | Status: DC
  Administered 2015-10-06 – 2015-10-07 (×2): 1300 [IU]/h via INTRAVENOUS
  Filled 2015-10-06 (×2): qty 250

## 2015-10-06 MED ORDER — ATORVASTATIN CALCIUM 10 MG PO TABS
10.0000 mg | ORAL_TABLET | Freq: Every day | ORAL | Status: DC
  Administered 2015-10-06: 10 mg via ORAL
  Filled 2015-10-06: qty 1

## 2015-10-06 MED ORDER — SODIUM CHLORIDE 0.9 % IV SOLN
INTRAVENOUS | Status: DC
  Administered 2015-10-06 – 2015-10-07 (×2): via INTRAVENOUS

## 2015-10-06 MED ORDER — HEPARIN BOLUS VIA INFUSION
4000.0000 [IU] | Freq: Once | INTRAVENOUS | Status: AC
  Administered 2015-10-06: 4000 [IU] via INTRAVENOUS
  Filled 2015-10-06: qty 4000

## 2015-10-06 MED ORDER — ASPIRIN 300 MG RE SUPP: 300.0000 mg | RECTAL | Status: DC

## 2015-10-06 NOTE — Progress Notes (Signed)
ANTICOAGULATION CONSULT NOTE - Follow Up Consult  Pharmacy Consult for heparin Indication: USAP    Vital Signs: Temp: 98.5 F (36.9 C) (05/08 2035) Temp Source: Oral (05/08 2035) BP: 153/74 mmHg (05/08 2035) Pulse Rate: 69 (05/08 2035)  Labs:  Recent Labs  10/06/15 1441 10/06/15 1859 10/06/15 2226  HGB 13.8  --   --   HCT 41.7  --   --   PLT 167  --   --   APTT 30  --   --   LABPROT 13.3  --   --   INR 0.99  --   --   HEPARINUNFRC  --   --  0.37  CREATININE 2.07*  --   --   TROPONINI 0.03 0.03  --     Assessment/Plan:  70yo male therapeutic on heparin with initial dosing for CP. Will continue gtt at current rate and confirm stable with am labs.   Wynona Neat, PharmD, BCPS  10/06/2015,11:46 PM

## 2015-10-06 NOTE — Progress Notes (Addendum)
 Cardiology Office Note    Date:  10/06/2015   ID:  Ronald [name], DOB 04/19/1946, MRN [id number]  PCP:  Richard Gilbert Jr, MD  Cardiologist:  Dr. McAlhany  Chief Complaint  Patient presents with  . Chest Pain    pt states on thursday night it felt like someone hit him in the chest  . Shortness of Breath    states some SOB, when doing just about anything     History of Present Illness:  Ronald [name] is a 69 y.o. male   history of CAD s/p CABG in 1996 (LIMA-LAD, SVG-circumflex, SVG-RCA, SVG-diagonal) with redo bypass in 2007 (SVG-OM, SVG-RCA), ischemic cardiomyopathy, CKD, DM 2, HTN, HLD, renal artery stenosis, COPD, GERD.Lexiscan Myoview 12/31/12: No ischemia or scar, EF 53%. Echo 12/31/12: Mild LVH, EF 50-55%, normal wall motion. He was hospitalized at [facility] July 2016 with dyspnea and chest pain and ruled in for NSTEMI. Cardiac cath with severe disease in the SVG to OM. This was treated with a 3.5 x 12 mm Synergy DES. He was treated with Brilinta post PCI but developed dyspnea and was changed to Plavix.   Wed he mowed the lawn(riding mower) and didn't feel good in general.Last Thurs at 9:00pm he complained of chest pressure. EMS told him to take 4 ASA and this resolved his pain. He didn't come to hospital. Last night about 10 pm chest was hurting and it eased 10 min. On drive here today chest tightness into Left arm eased 30 min. Now pain free and EKG without change.    Past Medical History  Diagnosis Date  . CAD (coronary artery disease) 2007    a. s/p CABG- IMA-LAD, VG-Cx, VG-RCA, VG-diag in 1999. B. sp redo CABG- VG-OM, VG-RCA in 2007 due to VG disease. c. NSTEMI 11/2014 s/p DES to SVG-OM from the Y graft.  . Ischemic cardiomyopathy 2006    EF 40% to 50% by 2D echo in 2006;  Echo 12/31/12: Mild LVH, EF 50-55%, normal wall motion.   . CKD (chronic kidney disease), stage IV (HCC)     Creatinine 1.9 to 2.2  . Hyperlipidemia   . Hypertension   . Renal artery stenosis (HCC)   .  Emphysema   . COPD (chronic obstructive pulmonary disease) (HCC)   . GERD (gastroesophageal reflux disease)   . Esophageal stricture 07/02/1998    EGD  . Dysrhythmia     bigeminy pvcs  . CHF (congestive heart failure) (HCC)   . History of hiatal hernia   . Walking pneumonia 1990's  . Type II diabetes mellitus (HCC)     Diet control   . History of gout     "last flareup was in 2007" (12/13/2014)  . Chronic lower back pain   . Arthritis     "hips; back" (12/13/2014)  . Degenerative disc disease, lumbar   . Deafness in left ear     Past Surgical History  Procedure Laterality Date  . Lung surgery  1996    "S/P CABG, had to put staple in lung after it had collapsed"  . Cardiac catheterization  "several"  . Coronary angioplasty  "several"  . Coronary angioplasty with stent placement  2005; 12/13/2014    "2; 1"  . Coronary artery bypass graft  1996    CABG X5  . Coronary artery bypass graft  March 2007    CABG X3  . Laparoscopic cholecystectomy    . Hernia repair    . Umbilical hernia repair        w/chole  . Green light laser turp (transurethral resection of prostate  2000's    "not cancerous"  . Esophagogastroduodenoscopy (egd) with esophageal dilation  2000  . Cardiac catheterization N/A 12/13/2014    Procedure: Left Heart Cath and Coronary Angiography;  Surgeon: Jayadeep S Varanasi, MD;  Location: [facility] INVASIVE CV LAB;  Service: Cardiovascular;  Laterality: N/A;  . Cardiac catheterization  12/13/2014    Procedure: Coronary Stent Intervention;  Surgeon: Jayadeep S Varanasi, MD;  Location: [facility] CV LAB;  Service: Cardiovascular;;    Current Medications: Outpatient Prescriptions Prior to Visit  Medication Sig Dispense Refill  . acetaminophen (TYLENOL) 325 MG tablet Take 650 mg by mouth every 6 (six) hours as needed.    . allopurinol (ZYLOPRIM) 100 MG tablet Take 1 tablet by mouth at  bedtime 90 tablet 3  . amLODipine (NORVASC) 5 MG tablet 1 TABLET, ORAL, DAILY 90 tablet 4  .  aspirin 81 MG tablet Take 81 mg by mouth every morning.     . atorvastatin (LIPITOR) 10 MG tablet Take 1 tablet by mouth  daily 90 tablet 0  . carvedilol (COREG) 12.5 MG tablet TAKE 1 TABLET BY MOUTH 2  TIMES DAILY WITH A MEAL 180 tablet 3  . clopidogrel (PLAVIX) 75 MG tablet Take 1 tablet (75 mg total) by mouth daily. 90 tablet 3  . magnesium oxide (MAG-OX) 400 (241.3 MG) MG tablet TAKE 1 TABLET BY MOUTH TWO TIMES DAILY. 180 tablet 4  . Multiple Vitamin (MULTIVITAMIN) tablet Take 1 tablet by mouth every morning.     . nitroGLYCERIN (NITROSTAT) 0.4 MG SL tablet Place 1 tablet (0.4 mg total) under the tongue every 5 (five) minutes as needed. May repeat up to three times. 25 tablet 6  . omeprazole (PRILOSEC) 20 MG capsule     . sodium bicarbonate 650 MG tablet Take 650 mg by mouth 2 (two) times daily.     No facility-administered medications prior to visit.     Allergies:   Ciprofloxacin; Hydrochlorothiazide; Hydrocodone; Hydrocodone-acetaminophen; Hydrocodone-acetaminophen; Sulfa antibiotics; Sulfacetamide sodium; and Penicillins   Social History   Social History  . Marital Status: Married    Spouse Name: N/A  . Number of Children: 2  . Years of Education: N/A   Occupational History  . Retired    Social History Main Topics  . Smoking status: Former Smoker -- 3.00 packs/day for 20 years    Types: Cigarettes    Quit date: 07/25/1986  . Smokeless tobacco: Never Used  . Alcohol Use: No  . Drug Use: No  . Sexual Activity: Not Currently   Other Topics Concern  . None   Social History Narrative   Did auto salvage work.     Family History:  The patient's    family history includes Heart attack in his mother; Heart disease in his father and mother; Hyperlipidemia in his mother; Hypertension in his mother; Rheumatic fever in his father; Stroke in his mother.   ROS:   Please see the history of present illness.    Review of Systems  Constitution: Negative.  Cardiovascular: Positive  for chest pain, dyspnea on exertion and palpitations.  Respiratory: Negative.   Endocrine: Negative.   Hematologic/Lymphatic: Negative.   Musculoskeletal: Negative.   Gastrointestinal: Positive for abdominal pain.  Genitourinary: Negative.   Neurological: Positive for dizziness.   All other systems reviewed and are negative.   PHYSICAL EXAM:   VS:  BP 144/76 mmHg  Pulse 66  Ht 5' 10" (1.778 m)    Wt 233 lb (105.688 kg)  BMI 33.43 kg/m2   GEN: Well nourished, well developed, in no acute distress HEENT: normal Neck: no JVD, carotid bruits, or masses Cardiac:  RRR; no murmurs, rubs, or gallops,no edema  Respiratory:  clear to auscultation bilaterally, normal work of breathing GI: soft, nontender, nondistended, + BS MS: no deformity or atrophy Skin: warm and dry, no rash Neuro:  Alert and Oriented x 3, Strength and sensation are intact Psych: euthymic mood, full affect  Wt Readings from Last 3 Encounters:  10/06/15 233 lb (105.688 kg)  10/03/15 233 lb (105.688 kg)  08/19/15 236 lb (107.049 kg)      Studies/Labs Reviewed:   EKG:  EKG is  ordered today.  The ekg ordered today demonstrates Normal sinus rhythm with nonspecific ST-T wave changes  Recent Labs: 12/03/2014: ALT 20 12/13/2014: TSH 1.472 09/26/2015: BUN 28*; Creatinine 1.8*; Hemoglobin 12.8*; Platelets 151; Potassium 4.5; Sodium 142   Lipid Panel    Component Value Date/Time   CHOL 135 12/13/2014 0423   TRIG 124 12/13/2014 0423   HDL 30* 12/13/2014 0423   CHOLHDL 4.5 12/13/2014 0423   VLDL 25 12/13/2014 0423   LDLCALC 80 12/13/2014 0423    Additional studies/ records that were reviewed today include:  Cardiac cath July 2016: Dominance: Co-dominant      Left Anterior Descending     .  Mid LAD lesion, 100% stenosed.     .  Second Diagonal Branch     The vessel is small in size.         Left Circumflex     .  Ost Cx to Prox Cx lesion, 100% stenosed.         Right Coronary Artery     .  Mid RCA lesion, 100%  stenosed.         Graft Angiography      Free Graft to 1st RPLB   SVG is moderate in size. There is mild diffuse disease in the graft.     .  Origin lesion, 25% stenosed. The lesion was previously treated with a stent (unknown type) .         Free Graft to RPDA   SVG     .  Origin to Prox Graft lesion, 40% stenosed.         LIMA Graft to Dist LAD   was injected is normal in caliber.         Free Graft to 2nd Diag   SVG There is mild diffuse disease in the graft.         Free Graft to 2nd Mrg   SVG is large.     .  Insertion lesion, 99% stenosed. The lesion is type C located at the major branch . Y graft to diagonal and OM. At the OM Insertion site, there was a 99% stenosis.     .  PCI: The pre-interventional distal flow is normal (TIMI 3). Pre-stent angioplasty was performed. 2.0 x 12 mm balloon A drug-eluting stent was placed. The strut is apposed. No post-stent angioplasty was performed. Maximum pressure: 16 atm. The post-interventional distal flow is normal (TIMI 3). The intervention was successful. No complications occurred at this lesion. The stent extended across the distal graft into the native vessel, across the anastomosis. The lesion was pre-treated several times with intracoronary adenosine since there wasfor distal embolic protection.   .  Supplies used: STENT SYNERGY DES 3.5X12   .  There is no residual   stenosis post intervention.               ASSESSMENT:    1. Unstable angina ([place])   2. Coronary artery disease involving other coronary artery bypass graft with unspecified angina pectoris   3. Hypertensive heart disease without CHF   4. Chronic kidney disease (CKD), stage IV (severe) (HCC)      PLAN:  In order of problems listed above:  Patient presents today with unstable angina having chest pain wall riding in the car here today. Discussed this patient with Dr. Tamala Julian and Dr.McAlhany. We'll admit to the hospital for IV heparin and IV fluids and plan cardiac  catheterization tomorrow. I have reviewed the risks, indications, and alternatives to angioplasty and stenting with the patient. Risks include but are not limited to bleeding, infection, vascular injury, stroke, myocardial infection, arrhythmia, kidney injury, radiation-related injury in the case of prolonged fluoroscopy use, emergency cardiac surgery, and death. The patient understands the risks of serious complication is low (<3%) and he agrees to proceed.   He's had a history of 2 prior CABG and stenting of the SVG to the [place] July 2016 as described above.  He has CKD with BUN and creatinine of 28/1.84/28/17. We'll hydrate prior to cath.  Hypertension stable, no evidence of heart failure.      Medication Adjustments/Labs and Tests Ordered: Current medicines are reviewed at length with the patient today.  Concerns regarding medicines are outlined above.  Medication changes, Labs and Tests ordered today are listed in the Patient Instructions below. Patient Instructions  Medication Instructions:   Your physician recommends that you continue on your current medications as directed. Please refer to the Current Medication list given to you today.   If you need a refill on your cardiac medications before your next appointment, please call your pharmacy.  Labwork: NONE ORDER TODAY    Testing/Procedures: NONE ORDER TODAY    Follow-Up:  WILL BE TOLD AFTER YOU HAVE BEEN ADMITTED   Any Other Special Instructions Will Be Listed Below (If Applicable).  YOU  HAVE BEEN RECCOMMENDED TO BE ADMITTED TO HOSPITAL                                                                                                                                                       Signed, Ermalinda Barrios, [place]  10/06/2015 12:55 PM    [facility] [place], [place], [place]  [address] Phone: [phone]; Fax: [phone]

## 2015-10-06 NOTE — H&P (Signed)
 Cardiology Office Note    Date:  10/06/2015   ID:  Ronald [name], DOB 05/28/1946, MRN [id number]  PCP:  Richard Gilbert Jr, MD  Cardiologist:  Dr. McAlhany  Chief Complaint  Patient presents with  . Chest Pain    pt states on thursday night it felt like someone hit him in the chest  . Shortness of Breath    states some SOB, when doing just about anything     History of Present Illness:  Ronald [name] is a 69 y.o. male   history of CAD s/p CABG in 1996 (LIMA-LAD, SVG-circumflex, SVG-RCA, SVG-diagonal) with redo bypass in 2007 (SVG-OM, SVG-RCA), ischemic cardiomyopathy, CKD, DM 2, HTN, HLD, renal artery stenosis, COPD, GERD.Lexiscan Myoview 12/31/12: No ischemia or scar, EF 53%. Echo 12/31/12: Mild LVH, EF 50-55%, normal wall motion. He was hospitalized at [facility] July 2016 with dyspnea and chest pain and ruled in for NSTEMI. Cardiac cath with severe disease in the SVG to OM. This was treated with a 3.5 x 12 mm Synergy DES. He was treated with Brilinta post PCI but developed dyspnea and was changed to Plavix.   Wed he mowed the lawn(riding mower) and didn't feel good in general.Last Thurs at 9:00pm he complained of chest pressure. EMS told him to take 4 ASA and this resolved his pain. He didn't come to hospital. Last night about 10 pm chest was hurting and it eased 10 min. On drive here today chest tightness into Left arm eased 30 min. Now pain free and EKG without change.    Past Medical History  Diagnosis Date  . CAD (coronary artery disease) 2007    a. s/p CABG- IMA-LAD, VG-Cx, VG-RCA, VG-diag in 1999. B. sp redo CABG- VG-OM, VG-RCA in 2007 due to VG disease. c. NSTEMI 11/2014 s/p DES to SVG-OM from the Y graft.  . Ischemic cardiomyopathy 2006    EF 40% to 50% by 2D echo in 2006;  Echo 12/31/12: Mild LVH, EF 50-55%, normal wall motion.   . CKD (chronic kidney disease), stage IV (HCC)     Creatinine 1.9 to 2.2  . Hyperlipidemia   . Hypertension   . Renal artery stenosis (HCC)   .  Emphysema   . COPD (chronic obstructive pulmonary disease) (HCC)   . GERD (gastroesophageal reflux disease)   . Esophageal stricture 07/02/1998    EGD  . Dysrhythmia     bigeminy pvcs  . CHF (congestive heart failure) (HCC)   . History of hiatal hernia   . Walking pneumonia 1990's  . Type II diabetes mellitus (HCC)     Diet control   . History of gout     "last flareup was in 2007" (12/13/2014)  . Chronic lower back pain   . Arthritis     "hips; back" (12/13/2014)  . Degenerative disc disease, lumbar   . Deafness in left ear     Past Surgical History  Procedure Laterality Date  . Lung surgery  1996    "S/P CABG, had to put staple in lung after it had collapsed"  . Cardiac catheterization  "several"  . Coronary angioplasty  "several"  . Coronary angioplasty with stent placement  2005; 12/13/2014    "2; 1"  . Coronary artery bypass graft  1996    CABG X5  . Coronary artery bypass graft  March 2007    CABG X3  . Laparoscopic cholecystectomy    . Hernia repair    . Umbilical hernia repair        w/chole  . Green light laser turp (transurethral resection of prostate  2000's    "not cancerous"  . Esophagogastroduodenoscopy (egd) with esophageal dilation  2000  . Cardiac catheterization N/A 12/13/2014    Procedure: Left Heart Cath and Coronary Angiography;  Surgeon: Jayadeep S Varanasi, MD;  Location: [facility];  Service: Cardiovascular;  Laterality: N/A;  . Cardiac catheterization  12/13/2014    Procedure: Coronary Stent Intervention;  Surgeon: Jayadeep S Varanasi, MD;  Location: [facility];  Service: Cardiovascular;;    Current Medications: Outpatient Prescriptions Prior to Visit  Medication Sig Dispense Refill  . acetaminophen (TYLENOL) 325 MG tablet Take 650 mg by mouth every 6 (six) hours as needed.    . allopurinol (ZYLOPRIM) 100 MG tablet Take 1 tablet by mouth at  bedtime 90 tablet 3  . amLODipine (NORVASC) 5 MG tablet 1 TABLET, ORAL, DAILY 90 tablet 4  .  aspirin 81 MG tablet Take 81 mg by mouth every morning.     . atorvastatin (LIPITOR) 10 MG tablet Take 1 tablet by mouth  daily 90 tablet 0  . carvedilol (COREG) 12.5 MG tablet TAKE 1 TABLET BY MOUTH 2  TIMES DAILY WITH A MEAL 180 tablet 3  . clopidogrel (PLAVIX) 75 MG tablet Take 1 tablet (75 mg total) by mouth daily. 90 tablet 3  . magnesium oxide (MAG-OX) 400 (241.3 MG) MG tablet TAKE 1 TABLET BY MOUTH TWO TIMES DAILY. 180 tablet 4  . Multiple Vitamin (MULTIVITAMIN) tablet Take 1 tablet by mouth every morning.     . nitroGLYCERIN (NITROSTAT) 0.4 MG SL tablet Place 1 tablet (0.4 mg total) under the tongue every 5 (five) minutes as needed. May repeat up to three times. 25 tablet 6  . omeprazole (PRILOSEC) 20 MG capsule     . sodium bicarbonate 650 MG tablet Take 650 mg by mouth 2 (two) times daily.     No facility-administered medications prior to visit.     Allergies:   Ciprofloxacin; Hydrochlorothiazide; Hydrocodone; Hydrocodone-acetaminophen; Hydrocodone-acetaminophen; Sulfa antibiotics; Sulfacetamide sodium; and Penicillins   Social History   Social History  . Marital Status: Married    Spouse Name: N/A  . Number of Children: 2  . Years of Education: N/A   Occupational History  . Retired    Social History Main Topics  . Smoking status: Former Smoker -- 3.00 packs/day for 20 years    Types: Cigarettes    Quit date: 07/25/1986  . Smokeless tobacco: Never Used  . Alcohol Use: No  . Drug Use: No  . Sexual Activity: Not Currently   Other Topics Concern  . None   Social History Narrative   Did auto salvage work.     Family History:  The patient's    family history includes Heart attack in his mother; Heart disease in his father and mother; Hyperlipidemia in his mother; Hypertension in his mother; Rheumatic fever in his father; Stroke in his mother.   ROS:   Please see the history of present illness.    Review of Systems  Constitution: Negative.  Cardiovascular: Positive  for chest pain, dyspnea on exertion and palpitations.  Respiratory: Negative.   Endocrine: Negative.   Hematologic/Lymphatic: Negative.   Musculoskeletal: Negative.   Gastrointestinal: Positive for abdominal pain.  Genitourinary: Negative.   Neurological: Positive for dizziness.   All other systems reviewed and are negative.   PHYSICAL EXAM:   VS:  BP 144/76 mmHg  Pulse 66  Ht 5' 10" (1.778 m)    Wt 233 lb (105.688 kg)  BMI 33.43 kg/m2   GEN: Well nourished, well developed, in no acute distress HEENT: normal Neck: no JVD, carotid bruits, or masses Cardiac:  RRR; no murmurs, rubs, or gallops,no edema  Respiratory:  clear to auscultation bilaterally, normal work of breathing GI: soft, nontender, nondistended, + BS MS: no deformity or atrophy Skin: warm and dry, no rash Neuro:  Alert and Oriented x 3, Strength and sensation are intact Psych: euthymic mood, full affect  Wt Readings from Last 3 Encounters:  10/06/15 233 lb (105.688 kg)  10/03/15 233 lb (105.688 kg)  08/19/15 236 lb (107.049 kg)      Studies/Labs Reviewed:   EKG:  EKG is  ordered today.  The ekg ordered today demonstrates Normal sinus rhythm with nonspecific ST-T wave changes  Recent Labs: 12/03/2014: ALT 20 12/13/2014: TSH 1.472 09/26/2015: BUN 28*; Creatinine 1.8*; Hemoglobin 12.8*; Platelets 151; Potassium 4.5; Sodium 142   Lipid Panel    Component Value Date/Time   CHOL 135 12/13/2014 0423   TRIG 124 12/13/2014 0423   HDL 30* 12/13/2014 0423   CHOLHDL 4.5 12/13/2014 0423   VLDL 25 12/13/2014 0423   LDLCALC 80 12/13/2014 0423    Additional studies/ records that were reviewed today include:  Cardiac cath July 2016: Dominance: Co-dominant      Left Anterior Descending     .  Mid LAD lesion, 100% stenosed.     .  Second Diagonal Branch     The vessel is small in size.         Left Circumflex     .  Ost Cx to Prox Cx lesion, 100% stenosed.         Right Coronary Artery     .  Mid RCA lesion, 100%  stenosed.         Graft Angiography      Free Graft to 1st RPLB   SVG is moderate in size. There is mild diffuse disease in the graft.     .  Origin lesion, 25% stenosed. The lesion was previously treated with a stent (unknown type) .         Free Graft to RPDA   SVG     .  Origin to Prox Graft lesion, 40% stenosed.         LIMA Graft to Dist LAD   was injected is normal in caliber.         Free Graft to 2nd Diag   SVG There is mild diffuse disease in the graft.         Free Graft to 2nd Mrg   SVG is large.     .  Insertion lesion, 99% stenosed. The lesion is type C located at the major branch . Y graft to diagonal and OM. At the OM Insertion site, there was a 99% stenosis.     .  PCI: The pre-interventional distal flow is normal (TIMI 3). Pre-stent angioplasty was performed. 2.0 x 12 mm balloon A drug-eluting stent was placed. The strut is apposed. No post-stent angioplasty was performed. Maximum pressure: 16 atm. The post-interventional distal flow is normal (TIMI 3). The intervention was successful. No complications occurred at this lesion. The stent extended across the distal graft into the native vessel, across the anastomosis. The lesion was pre-treated several times with intracoronary adenosine since there wasfor distal embolic protection.   .  Supplies used: STENT SYNERGY DES 3.5X12   .  There is no residual   stenosis post intervention.               ASSESSMENT:    1. Unstable angina ([place])   2. Coronary artery disease involving other coronary artery bypass graft with unspecified angina pectoris   3. Hypertensive heart disease without CHF   4. Chronic kidney disease (CKD), stage IV (severe) (HCC)      PLAN:  In order of problems listed above:  Patient presents today with unstable angina having chest pain wall riding in the car here today. Discussed this patient with Dr. Tamala Julian and Dr.McAlhany. We'll admit to the hospital for IV heparin and IV fluids and plan cardiac  catheterization tomorrow. I have reviewed the risks, indications, and alternatives to angioplasty and stenting with the patient. Risks include but are not limited to bleeding, infection, vascular injury, stroke, myocardial infection, arrhythmia, kidney injury, radiation-related injury in the case of prolonged fluoroscopy use, emergency cardiac surgery, and death. The patient understands the risks of serious complication is low (<4%) and he agrees to proceed.   He's had a history of 2 prior CABG and stenting of the SVG to the [place] July 2016 as described above.  He has CKD with BUN and creatinine of 28/1.84/28/17. We'll hydrate prior to cath.  Hypertension stable, no evidence of heart failure.  Ermalinda Barrios PA-C [facility]  The patient has been seen in conjunction with Ermalinda Barrios, PA-C. All aspects of care have been considered and discussed. The patient has been personally interviewed, examined, and all clinical data has been reviewed.   The patient was seen, examined, and treatment plan formulated after conversation with Dr. Deirdre Peer via Mrs. Bonnell Public. The patient will be admitted to the hospital, recatheterization orders will be placed, he has been set up to have angiography performed in the a.m., IV heparin will be started, and precath IV hydration performed.

## 2015-10-06 NOTE — Patient Instructions (Signed)
Medication Instructions:   Your physician recommends that you continue on your current medications as directed. Please refer to the Current Medication list given to you today.   If you need a refill on your cardiac medications before your next appointment, please call your pharmacy.  Labwork: NONE ORDER TODAY    Testing/Procedures: NONE ORDER TODAY    Follow-Up:  WILL BE TOLD AFTER YOU HAVE BEEN ADMITTED   Any Other Special Instructions Will Be Listed Below (If Applicable).  YOU  HAVE BEEN RECCOMMENDED TO BE ADMITTED TO HOSPITAL

## 2015-10-06 NOTE — Progress Notes (Signed)
ANTICOAGULATION CONSULT NOTE - Initial Consult  Pharmacy Consult for Heparin Indication: chest pain/ACS  Allergies  Allergen Reactions  . Ciprofloxacin     GI upset  . Hydrochlorothiazide Other (See Comments)    dehydrates Dehydration  . Hydrocodone     Stomach upset Stomach upset  . Hydrocodone-Acetaminophen     Stomach upset  . Hydrocodone-Acetaminophen Nausea Only  . Sulfa Antibiotics   . Sulfacetamide Sodium   . Penicillins Hives and Rash    Patient Measurements: Weight: 231 lb 1.6 oz (104.826 kg)  Height: 5'10'' IBW: 71kg Heparin Dosing Weight: 81kg  Vital Signs: Temp: 98.5 F (36.9 C) (05/08 1325) Temp Source: Oral (05/08 1325) BP: 176/79 mmHg (05/08 1325) Pulse Rate: 66 (05/08 1325)  Labs: No results for input(s): HGB, HCT, PLT, APTT, LABPROT, INR, HEPARINUNFRC, HEPRLOWMOCWT, CREATININE, CKTOTAL, CKMB, TROPONINI in the last 72 hours.  Estimated Creatinine Clearance: 45.6 mL/min (by C-G formula based on Cr of 1.8).   Medical History: Past Medical History  Diagnosis Date  . CAD (coronary artery disease) 2007    a. s/p CABG- IMA-LAD, VG-Cx, VG-RCA, VG-diag in 1999. B. sp redo CABG- VG-OM, VG-RCA in 2007 due to VG disease. c. NSTEMI 11/2014 s/p DES to SVG-OM from the Y graft.  . Ischemic cardiomyopathy 2006    EF 40% to 50% by 2D echo in 2006;  Echo 12/31/12: Mild LVH, EF 50-55%, normal wall motion.   . CKD (chronic kidney disease), stage IV (HCC)     Creatinine 1.9 to 2.2  . Hyperlipidemia   . Hypertension   . Renal artery stenosis ([place])   . Emphysema   . COPD (chronic obstructive pulmonary disease) ([place])   . GERD (gastroesophageal reflux disease)   . Esophageal stricture 07/02/1998    EGD  . Dysrhythmia     bigeminy pvcs  . CHF (congestive heart failure) ([place])   . History of hiatal hernia   . Walking pneumonia 1990's  . Type II diabetes mellitus (HCC)     Diet control   . History of gout     "last flareup was in 2007" (12/13/2014)  . Chronic lower  back pain   . Arthritis     "hips; back" (12/13/2014)  . Degenerative disc disease, lumbar   . Deafness in left ear     Medications:  No anticoagulants pta  Assessment: 69yom with hx CAD s/p CABG in 1996 with redo bypass in 2007 presented to cardiology clinic today with unstable angina. He is being admitted for IV heparin with plans for cath tomorrow. Previously therapeutic on 1300 units/hr back in July 2016. Baseline labs pending.  Goal of Therapy:  Heparin level 0.3-0.7 units/ml Monitor platelets by anticoagulation protocol: Yes   Plan:  1) Heparin bolus 4000 units x 1 2) Heparin drip at 1300 units/hr 3) 8 hour heparin level 4) Daily heparin level and CBC  Deboraha Sprang 10/06/2015,2:08 PM

## 2015-10-07 ENCOUNTER — Other Ambulatory Visit: Payer: Self-pay

## 2015-10-07 ENCOUNTER — Encounter (HOSPITAL_COMMUNITY)
Admission: AD | Disposition: A | Discharge status: Home / Self Care | Payer: Self-pay | Source: Ambulatory Visit | Attending: Interventional Cardiology

## 2015-10-07 ENCOUNTER — Encounter (HOSPITAL_COMMUNITY): Payer: Self-pay | Admitting: Cardiology

## 2015-10-07 DIAGNOSIS — N179 Acute kidney failure, unspecified: Secondary | ICD-10-CM

## 2015-10-07 DIAGNOSIS — I25119 Atherosclerotic heart disease of native coronary artery with unspecified angina pectoris: Secondary | ICD-10-CM | POA: Insufficient documentation

## 2015-10-07 DIAGNOSIS — I1310 Hypertensive heart and chronic kidney disease without heart failure, with stage 1 through stage 4 chronic kidney disease, or unspecified chronic kidney disease: Secondary | ICD-10-CM | POA: Diagnosis not present

## 2015-10-07 DIAGNOSIS — I252 Old myocardial infarction: Secondary | ICD-10-CM | POA: Diagnosis not present

## 2015-10-07 DIAGNOSIS — N183 Chronic kidney disease, stage 3 unspecified: Secondary | ICD-10-CM

## 2015-10-07 DIAGNOSIS — I25810 Atherosclerosis of coronary artery bypass graft(s) without angina pectoris: Secondary | ICD-10-CM

## 2015-10-07 DIAGNOSIS — Z8249 Family history of ischemic heart disease and other diseases of the circulatory system: Secondary | ICD-10-CM | POA: Diagnosis not present

## 2015-10-07 DIAGNOSIS — I701 Atherosclerosis of renal artery: Secondary | ICD-10-CM | POA: Diagnosis present

## 2015-10-07 DIAGNOSIS — Z955 Presence of coronary angioplasty implant and graft: Secondary | ICD-10-CM | POA: Diagnosis not present

## 2015-10-07 DIAGNOSIS — J449 Chronic obstructive pulmonary disease, unspecified: Secondary | ICD-10-CM | POA: Diagnosis present

## 2015-10-07 DIAGNOSIS — I119 Hypertensive heart disease without heart failure: Secondary | ICD-10-CM

## 2015-10-07 DIAGNOSIS — E1122 Type 2 diabetes mellitus with diabetic chronic kidney disease: Secondary | ICD-10-CM | POA: Diagnosis present

## 2015-10-07 DIAGNOSIS — I25110 Atherosclerotic heart disease of native coronary artery with unstable angina pectoris: Secondary | ICD-10-CM | POA: Insufficient documentation

## 2015-10-07 DIAGNOSIS — I420 Dilated cardiomyopathy: Secondary | ICD-10-CM | POA: Diagnosis not present

## 2015-10-07 DIAGNOSIS — Z87891 Personal history of nicotine dependence: Secondary | ICD-10-CM | POA: Diagnosis not present

## 2015-10-07 DIAGNOSIS — E785 Hyperlipidemia, unspecified: Secondary | ICD-10-CM

## 2015-10-07 DIAGNOSIS — Z7982 Long term (current) use of aspirin: Secondary | ICD-10-CM | POA: Diagnosis not present

## 2015-10-07 DIAGNOSIS — Z79899 Other long term (current) drug therapy: Secondary | ICD-10-CM | POA: Diagnosis not present

## 2015-10-07 DIAGNOSIS — Y838 Other surgical procedures as the cause of abnormal reaction of the patient, or of later complication, without mention of misadventure at the time of the procedure: Secondary | ICD-10-CM | POA: Diagnosis present

## 2015-10-07 DIAGNOSIS — R0789 Other chest pain: Secondary | ICD-10-CM | POA: Diagnosis present

## 2015-10-07 DIAGNOSIS — I2510 Atherosclerotic heart disease of native coronary artery without angina pectoris: Secondary | ICD-10-CM | POA: Insufficient documentation

## 2015-10-07 DIAGNOSIS — T82858A Stenosis of vascular prosthetic devices, implants and grafts, initial encounter: Secondary | ICD-10-CM | POA: Diagnosis not present

## 2015-10-07 DIAGNOSIS — K219 Gastro-esophageal reflux disease without esophagitis: Secondary | ICD-10-CM | POA: Diagnosis present

## 2015-10-07 DIAGNOSIS — Z7902 Long term (current) use of antithrombotics/antiplatelets: Secondary | ICD-10-CM | POA: Diagnosis not present

## 2015-10-07 HISTORY — PX: CARDIAC CATHETERIZATION: SHX172

## 2015-10-07 HISTORY — DX: Dilated cardiomyopathy: I42.0

## 2015-10-07 LAB — BASIC METABOLIC PANEL
ANION GAP: 13 (ref 5–15)
BUN: 20 mg/dL (ref 6–20)
CHLORIDE: 107 mmol/L (ref 101–111)
CO2: 20 mmol/L — ABNORMAL LOW (ref 22–32)
Calcium: 8.7 mg/dL — ABNORMAL LOW (ref 8.9–10.3)
Creatinine, Ser: 1.6 mg/dL — ABNORMAL HIGH (ref 0.61–1.24)
GFR, EST AFRICAN AMERICAN: 48 mL/min — AB (ref >60)
GFR, EST NON AFRICAN AMERICAN: 42 mL/min — AB (ref >60)
Glucose, Bld: 115 mg/dL — ABNORMAL HIGH (ref 65–99)
POTASSIUM: 4.5 mmol/L (ref 3.5–5.1)
SODIUM: 140 mmol/L (ref 135–145)

## 2015-10-07 LAB — CBC
HEMATOCRIT: 39.7 % (ref 39.0–52.0)
HEMOGLOBIN: 13.1 g/dL (ref 13.0–17.0)
MCH: 28.7 pg (ref 26.0–34.0)
MCHC: 33 g/dL (ref 30.0–36.0)
MCV: 87.1 fL (ref 78.0–100.0)
Platelets: 145 10*3/uL — ABNORMAL LOW (ref 150–400)
RBC: 4.56 MIL/uL (ref 4.22–5.81)
RDW: 14.2 % (ref 11.5–15.5)
WBC: 7 10*3/uL (ref 4.0–10.5)

## 2015-10-07 LAB — TROPONIN I: TROPONIN I: 0.04 ng/mL — AB (ref <0.031)

## 2015-10-07 LAB — HEPARIN LEVEL (UNFRACTIONATED): HEPARIN UNFRACTIONATED: 0.32 [IU]/mL (ref 0.30–0.70)

## 2015-10-07 LAB — POCT ACTIVATED CLOTTING TIME: Activated Clotting Time: 368 seconds

## 2015-10-07 SURGERY — LEFT HEART CATH AND CORS/GRAFTS ANGIOGRAPHY

## 2015-10-07 MED ORDER — SODIUM CHLORIDE 0.9 % IV SOLN
250.0000 mg | INTRAVENOUS | Status: DC | PRN
  Administered 2015-10-07: 1.75 mg/kg/h via INTRAVENOUS

## 2015-10-07 MED ORDER — SODIUM BICARBONATE 650 MG PO TABS
650.0000 mg | ORAL_TABLET | Freq: Two times a day (BID) | ORAL | Status: DC
  Administered 2015-10-07 – 2015-10-08 (×2): 650 mg via ORAL
  Filled 2015-10-07 (×2): qty 1

## 2015-10-07 MED ORDER — CARVEDILOL 12.5 MG PO TABS
12.5000 mg | ORAL_TABLET | Freq: Two times a day (BID) | ORAL | Status: DC
  Administered 2015-10-07 – 2015-10-08 (×2): 12.5 mg via ORAL
  Filled 2015-10-07 (×2): qty 1

## 2015-10-07 MED ORDER — HYDRALAZINE HCL 20 MG/ML IJ SOLN
INTRAMUSCULAR | Status: DC | PRN
  Administered 2015-10-07: 20 mg via INTRAVENOUS

## 2015-10-07 MED ORDER — PANTOPRAZOLE SODIUM 40 MG PO TBEC
40.0000 mg | DELAYED_RELEASE_TABLET | Freq: Every day | ORAL | Status: DC
  Administered 2015-10-07 – 2015-10-08 (×2): 40 mg via ORAL
  Filled 2015-10-07 (×2): qty 1

## 2015-10-07 MED ORDER — ASPIRIN EC 81 MG PO TBEC
81.0000 mg | DELAYED_RELEASE_TABLET | Freq: Every day | ORAL | Status: DC
  Administered 2015-10-08: 81 mg via ORAL
  Filled 2015-10-07: qty 1

## 2015-10-07 MED ORDER — ADULT MULTIVITAMIN W/MINERALS CH
1.0000 | ORAL_TABLET | Freq: Every day | ORAL | Status: DC
  Administered 2015-10-08: 10:00:00 1 via ORAL
  Filled 2015-10-07: qty 1

## 2015-10-07 MED ORDER — ACETAMINOPHEN 325 MG PO TABS
650.0000 mg | ORAL_TABLET | ORAL | Status: DC | PRN
  Administered 2015-10-07: 18:00:00 650 mg via ORAL
  Filled 2015-10-07: qty 2

## 2015-10-07 MED ORDER — SODIUM CHLORIDE 0.9% FLUSH: 3.0000 mL | INTRAVENOUS | Status: DC | PRN

## 2015-10-07 MED ORDER — IOPAMIDOL (ISOVUE-370) INJECTION 76%
INTRAVENOUS | Status: AC
  Filled 2015-10-07: qty 125

## 2015-10-07 MED ORDER — IOPAMIDOL (ISOVUE-370) INJECTION 76%
INTRAVENOUS | Status: DC | PRN
  Administered 2015-10-07: 145 mL via INTRA_ARTERIAL

## 2015-10-07 MED ORDER — CLOPIDOGREL BISULFATE 300 MG PO TABS
ORAL_TABLET | ORAL | Status: AC
  Filled 2015-10-07: qty 1

## 2015-10-07 MED ORDER — CLOPIDOGREL BISULFATE 300 MG PO TABS
ORAL_TABLET | ORAL | Status: DC | PRN
  Administered 2015-10-07: 600 mg via ORAL

## 2015-10-07 MED ORDER — HYDRALAZINE HCL 20 MG/ML IJ SOLN
10.0000 mg | INTRAMUSCULAR | Status: DC | PRN
  Administered 2015-10-07: 19:00:00 10 mg via INTRAVENOUS

## 2015-10-07 MED ORDER — SODIUM CHLORIDE 0.9 % IV SOLN: INTRAVENOUS | Status: AC

## 2015-10-07 MED ORDER — NITROGLYCERIN 0.4 MG SL SUBL: 0.4000 mg | SUBLINGUAL_TABLET | SUBLINGUAL | Status: DC | PRN

## 2015-10-07 MED ORDER — IOPAMIDOL (ISOVUE-370) INJECTION 76%
INTRAVENOUS | Status: AC
  Filled 2015-10-07: qty 100

## 2015-10-07 MED ORDER — ALLOPURINOL 100 MG PO TABS
100.0000 mg | ORAL_TABLET | Freq: Every day | ORAL | Status: DC
  Administered 2015-10-07: 22:00:00 100 mg via ORAL
  Filled 2015-10-07: qty 1

## 2015-10-07 MED ORDER — ANGIOPLASTY BOOK
Freq: Once | Status: AC
  Administered 2015-10-07: 22:00:00
  Filled 2015-10-07: qty 1

## 2015-10-07 MED ORDER — LIDOCAINE HCL (PF) 1 % IJ SOLN
INTRAMUSCULAR | Status: AC
  Filled 2015-10-07: qty 30

## 2015-10-07 MED ORDER — HEPARIN (PORCINE) IN NACL 2-0.9 UNIT/ML-% IJ SOLN
INTRAMUSCULAR | Status: AC
  Filled 2015-10-07: qty 1500

## 2015-10-07 MED ORDER — HEPARIN (PORCINE) IN NACL 2-0.9 UNIT/ML-% IJ SOLN
INTRAMUSCULAR | Status: DC | PRN
  Administered 2015-10-07: 1500 mL

## 2015-10-07 MED ORDER — ONDANSETRON HCL 4 MG/2ML IJ SOLN: 4.0000 mg | Freq: Four times a day (QID) | INTRAMUSCULAR | Status: DC | PRN

## 2015-10-07 MED ORDER — HYDRALAZINE HCL 20 MG/ML IJ SOLN
INTRAMUSCULAR | Status: AC
  Filled 2015-10-07: qty 1

## 2015-10-07 MED ORDER — FENTANYL CITRATE (PF) 100 MCG/2ML IJ SOLN
INTRAMUSCULAR | Status: AC
  Filled 2015-10-07: qty 2

## 2015-10-07 MED ORDER — ATORVASTATIN CALCIUM 10 MG PO TABS
10.0000 mg | ORAL_TABLET | Freq: Every day | ORAL | Status: DC
  Administered 2015-10-07: 10 mg via ORAL
  Filled 2015-10-07: qty 1

## 2015-10-07 MED ORDER — BIVALIRUDIN BOLUS VIA INFUSION - CUPID
INTRAVENOUS | Status: DC | PRN
  Administered 2015-10-07: 78.6 mg via INTRAVENOUS

## 2015-10-07 MED ORDER — CLOPIDOGREL BISULFATE 75 MG PO TABS
75.0000 mg | ORAL_TABLET | Freq: Every day | ORAL | Status: DC
  Administered 2015-10-08: 10:00:00 75 mg via ORAL
  Filled 2015-10-07: qty 1

## 2015-10-07 MED ORDER — TRAMADOL HCL 50 MG PO TABS
50.0000 mg | ORAL_TABLET | Freq: Four times a day (QID) | ORAL | Status: DC | PRN
  Administered 2015-10-07: 50 mg via ORAL
  Filled 2015-10-07: qty 1

## 2015-10-07 MED ORDER — BIVALIRUDIN 250 MG IV SOLR
INTRAVENOUS | Status: AC
  Filled 2015-10-07: qty 250

## 2015-10-07 MED ORDER — ONE-DAILY MULTI VITAMINS PO TABS: 1.0000 | ORAL_TABLET | Freq: Every morning | ORAL | Status: DC

## 2015-10-07 MED ORDER — MIDAZOLAM HCL 2 MG/2ML IJ SOLN
INTRAMUSCULAR | Status: AC
  Filled 2015-10-07: qty 2

## 2015-10-07 MED ORDER — MIDAZOLAM HCL 2 MG/2ML IJ SOLN
INTRAMUSCULAR | Status: DC | PRN
  Administered 2015-10-07: 2 mg via INTRAVENOUS

## 2015-10-07 MED ORDER — SODIUM CHLORIDE 0.9 % IV SOLN: 250.0000 mL | INTRAVENOUS | Status: DC | PRN

## 2015-10-07 MED ORDER — FENTANYL CITRATE (PF) 100 MCG/2ML IJ SOLN
INTRAMUSCULAR | Status: DC | PRN
  Administered 2015-10-07: 25 ug via INTRAVENOUS

## 2015-10-07 MED ORDER — LIDOCAINE HCL (PF) 1 % IJ SOLN
INTRAMUSCULAR | Status: DC | PRN
  Administered 2015-10-07: 14 mL

## 2015-10-07 MED ORDER — AMLODIPINE BESYLATE 5 MG PO TABS
5.0000 mg | ORAL_TABLET | Freq: Every day | ORAL | Status: DC
  Administered 2015-10-07 – 2015-10-08 (×2): 5 mg via ORAL
  Filled 2015-10-07 (×2): qty 1

## 2015-10-07 MED ORDER — SODIUM CHLORIDE 0.9% FLUSH
3.0000 mL | Freq: Two times a day (BID) | INTRAVENOUS | Status: DC
  Administered 2015-10-07: 18:00:00 3 mL via INTRAVENOUS

## 2015-10-07 SURGICAL SUPPLY — 19 items
BALLN EMERGE MR 2.5X12 (BALLOONS) ×3
BALLN ~~LOC~~ EUPHORA RX 2.75X8 (BALLOONS) ×3
BALLOON EMERGE MR 2.5X12 (BALLOONS) ×1 IMPLANT
BALLOON ~~LOC~~ EUPHORA RX 2.75X8 (BALLOONS) ×1 IMPLANT
CATH INFINITI 5 FR IM (CATHETERS) ×3 IMPLANT
CATH INFINITI 5 FR RCB (CATHETERS) ×3 IMPLANT
CATH INFINITI 5FR MULTPACK ANG (CATHETERS) ×3 IMPLANT
GUIDE CATH RUNWAY 6FR AL 1 (CATHETERS) ×3 IMPLANT
KIT ENCORE 26 ADVANTAGE (KITS) ×3 IMPLANT
KIT HEART LEFT (KITS) ×3 IMPLANT
PACK CARDIAC CATHETERIZATION (CUSTOM PROCEDURE TRAY) ×3 IMPLANT
SHEATH PINNACLE 5F 10CM (SHEATH) ×3 IMPLANT
SHEATH PINNACLE 6F 10CM (SHEATH) ×3 IMPLANT
STENT SYNERGY DES 2.5X16 (Permanent Stent) ×3 IMPLANT
TRANSDUCER W/STOPCOCK (MISCELLANEOUS) ×3 IMPLANT
TUBING CIL FLEX 10 FLL-RA (TUBING) ×3 IMPLANT
WIRE COUGAR XT STRL 190CM (WIRE) ×3 IMPLANT
WIRE EMERALD 3MM-J .035X150CM (WIRE) ×3 IMPLANT
WIRE EMERALD 3MM-J .035X260CM (WIRE) ×3 IMPLANT

## 2015-10-07 NOTE — Care Management Obs Status (Signed)
[facility] NOTIFICATION   Patient Details  Name: PRATT REDA MRN: [id number] Date of Birth: 1946/01/28   Medicare Observation Status Notification Given:  Yes (Chest pain)    Bethena Roys, RN 10/07/2015, 12:43 PM

## 2015-10-07 NOTE — Progress Notes (Addendum)
SUBJECTIVE:  No further chest pain  OBJECTIVE:   Vitals:   Filed Vitals:   10/06/15 1325 10/06/15 1328 10/06/15 2035 10/07/15 0500  BP: 176/79  153/74 133/63  Pulse: 66  69 82  Temp: 98.5 F (36.9 C)  98.5 F (36.9 C) 97.7 F (36.5 C)  TempSrc: Oral  Oral   Resp: 18  18 21   Height:  5\' 10"  (1.778 m)    Weight:  231 lb 1.6 oz (104.826 kg)  231 lb (104.781 kg)  SpO2: 100%  100% 98%   I&O's:   Intake/Output Summary (Last 24 hours) at 10/07/15 0852 Last data filed at 10/07/15 0400  Gross per 24 hour  Intake 836.85 ml  Output    675 ml  Net 161.85 ml   TELEMETRY: Reviewed telemetry pt in NSR:     PHYSICAL EXAM General: Well developed, well nourished, in no acute distress Head: Eyes PERRLA, No xanthomas.   Normal cephalic and atramatic  Lungs:   Clear bilaterally to auscultation and percussion. Heart:   HRRR S1 S2 Pulses are 2+ & equal. Abdomen: Bowel sounds are positive, abdomen soft and non-tender without masses  Extremities:   No clubbing, cyanosis or edema.  DP +1 Neuro: Alert and oriented X 3. Psych:  Good affect, responds appropriately   LABS: Basic Metabolic Panel:  Recent Labs  10/06/15 1441 10/07/15 0559  NA 142 140  K 5.1 4.5  CL 108 107  CO2 26 20*  GLUCOSE 86 115*  BUN 27* 20  CREATININE 2.07* 1.60*  CALCIUM 9.4 8.7*  MG 2.5*  --    Liver Function Tests:  Recent Labs  10/06/15 1441  AST 19  ALT 16*  ALKPHOS 151*  BILITOT 1.1  PROT 6.5  ALBUMIN 3.8   No results for input(s): LIPASE, AMYLASE in the last 72 hours. CBC:  Recent Labs  10/06/15 1441 10/07/15 0559  WBC 7.9 7.0  NEUTROABS 5.2  --   HGB 13.8 13.1  HCT 41.7 39.7  MCV 87.8 87.1  PLT 167 145*   Cardiac Enzymes:  Recent Labs  10/06/15 1441 10/06/15 1859 10/07/15 0133  TROPONINI 0.03 0.03 0.04*   BNP: Invalid input(s): POCBNP D-Dimer: No results for input(s): DDIMER in the last 72 hours. Hemoglobin A1C: No results for input(s): HGBA1C in the last 72  hours. Fasting Lipid Panel: No results for input(s): CHOL, HDL, LDLCALC, TRIG, CHOLHDL, LDLDIRECT in the last 72 hours. Thyroid Function Tests:  Recent Labs  10/06/15 1441  TSH 2.212   Anemia Panel: No results for input(s): VITAMINB12, FOLATE, FERRITIN, TIBC, IRON, RETICCTPCT in the last 72 hours. Coag Panel:   Lab Results  Component Value Date   INR 0.99 10/06/2015   INR 1.04 12/13/2014   INR 0.92 12/13/2014    RADIOLOGY: No results found.    ASSESSMENT/PLAN:   1.  Unstable Angina - no further chest pain since admission.  Plan for left heart cath today.  Continue IV Heparin gtt.   2.  ASCAD s/p CABG in 1996 (LIMA-LAD, SVG-circumflex, SVG-RCA, SVG-diagonal) with redo bypass in 2007 (SVG-OM, SVG-RCA),  and PCI of the SVG to OM2 in July 2016.  Continue ASA/statin/Heparin gtt. 3.  Acute on CKD - admitted yesterday and hydrated overnight.  Creatinine decreased today 2.07-->1.6. 4.  Ischemic DCM - EF 50-55% 5.  HTN - BP fairly well controlled but trending upward.  Will restart Coreg at 3.125mg  BID and titrated as needed for BP control.  Amlodipine on hold.  6.  Dyslipidemia - last LDL was above goal at 80 last July.  Repeat FLP in am.  Sueanne Margarita, MD  10/07/2015  8:52 AM

## 2015-10-07 NOTE — Interval H&P Note (Signed)
History and Physical Interval Note:  10/07/2015 11:57 AM  Keean W [facility]  has presented today for cardiac cath with the diagnosis of unstable angina. The various methods of treatment have been discussed with the patient and family. After consideration of risks, benefits and other options for treatment, the patient has consented to  Procedure(s): Left Heart Cath and Cors/Grafts Angiography (N/A) as a surgical intervention .  The patient's history has been reviewed, patient examined, no change in status, stable for surgery.  I have reviewed the patient's chart and labs.  Questions were answered to the patient's satisfaction.    Kevonta Phariss

## 2015-10-07 NOTE — Progress Notes (Signed)
Site area: right groin  Site Prior to Removal:  Level 0  Pressure Applied For 20 MINUTES    Minutes Beginning at 1940  Manual:   Yes.    Patient Status During Pull:  stable  Post Pull Groin Site:  Level 0  Post Pull Instructions Given:  Yes.    Post Pull Pulses Present:  Yes.    Dressing Applied:  Yes.    Comments:  Post sheath pull instructions given. Pt tolerated sheath pull well.

## 2015-10-07 NOTE — H&P (View-Only) (Signed)
SUBJECTIVE:  No further chest pain  OBJECTIVE:   Vitals:   Filed Vitals:   10/06/15 1325 10/06/15 1328 10/06/15 2035 10/07/15 0500  BP: 176/79  153/74 133/63  Pulse: 66  69 82  Temp: 98.5 F (36.9 C)  98.5 F (36.9 C) 97.7 F (36.5 C)  TempSrc: Oral  Oral   Resp: 18  18 21   Height:  5\' 10"  (1.778 m)    Weight:  231 lb 1.6 oz (104.826 kg)  231 lb (104.781 kg)  SpO2: 100%  100% 98%   I&O's:   Intake/Output Summary (Last 24 hours) at 10/07/15 0852 Last data filed at 10/07/15 0400  Gross per 24 hour  Intake 836.85 ml  Output    675 ml  Net 161.85 ml   TELEMETRY: Reviewed telemetry pt in NSR:     PHYSICAL EXAM General: Well developed, well nourished, in no acute distress Head: Eyes PERRLA, No xanthomas.   Normal cephalic and atramatic  Lungs:   Clear bilaterally to auscultation and percussion. Heart:   HRRR S1 S2 Pulses are 2+ & equal. Abdomen: Bowel sounds are positive, abdomen soft and non-tender without masses  Extremities:   No clubbing, cyanosis or edema.  DP +1 Neuro: Alert and oriented X 3. Psych:  Good affect, responds appropriately   LABS: Basic Metabolic Panel:  Recent Labs  10/06/15 1441 10/07/15 0559  NA 142 140  K 5.1 4.5  CL 108 107  CO2 26 20*  GLUCOSE 86 115*  BUN 27* 20  CREATININE 2.07* 1.60*  CALCIUM 9.4 8.7*  MG 2.5*  --    Liver Function Tests:  Recent Labs  10/06/15 1441  AST 19  ALT 16*  ALKPHOS 151*  BILITOT 1.1  PROT 6.5  ALBUMIN 3.8   No results for input(s): LIPASE, AMYLASE in the last 72 hours. CBC:  Recent Labs  10/06/15 1441 10/07/15 0559  WBC 7.9 7.0  NEUTROABS 5.2  --   HGB 13.8 13.1  HCT 41.7 39.7  MCV 87.8 87.1  PLT 167 145*   Cardiac Enzymes:  Recent Labs  10/06/15 1441 10/06/15 1859 10/07/15 0133  TROPONINI 0.03 0.03 0.04*   BNP: Invalid input(s): POCBNP D-Dimer: No results for input(s): DDIMER in the last 72 hours. Hemoglobin A1C: No results for input(s): HGBA1C in the last 72  hours. Fasting Lipid Panel: No results for input(s): CHOL, HDL, LDLCALC, TRIG, CHOLHDL, LDLDIRECT in the last 72 hours. Thyroid Function Tests:  Recent Labs  10/06/15 1441  TSH 2.212   Anemia Panel: No results for input(s): VITAMINB12, FOLATE, FERRITIN, TIBC, IRON, RETICCTPCT in the last 72 hours. Coag Panel:   Lab Results  Component Value Date   INR 0.99 10/06/2015   INR 1.04 12/13/2014   INR 0.92 12/13/2014    RADIOLOGY: No results found.    ASSESSMENT/PLAN:   1.  Unstable Angina - no further chest pain since admission.  Plan for left heart cath today.  Continue IV Heparin gtt.   2.  ASCAD s/p CABG in 1996 (LIMA-LAD, SVG-circumflex, SVG-RCA, SVG-diagonal) with redo bypass in 2007 (SVG-OM, SVG-RCA),  and PCI of the SVG to OM2 in July 2016.  Continue ASA/statin/Heparin gtt. 3.  Acute on CKD - admitted yesterday and hydrated overnight.  Creatinine decreased today 2.07-->1.6. 4.  Ischemic DCM - EF 50-55% 5.  HTN - BP fairly well controlled but trending upward.  Will restart Coreg at 3.125mg  BID and titrated as needed for BP control.  Amlodipine on hold.  6.  Dyslipidemia - last LDL was above goal at 80 last July.  Repeat FLP in am.  Sueanne Margarita, MD  10/07/2015  8:52 AM

## 2015-10-08 ENCOUNTER — Encounter (HOSPITAL_COMMUNITY): Payer: Self-pay | Admitting: Cardiology

## 2015-10-08 ENCOUNTER — Ambulatory Visit: Payer: Medicare Other | Admitting: Family Medicine

## 2015-10-08 ENCOUNTER — Telehealth: Payer: Self-pay | Admitting: Physician Assistant

## 2015-10-08 DIAGNOSIS — I25110 Atherosclerotic heart disease of native coronary artery with unstable angina pectoris: Secondary | ICD-10-CM

## 2015-10-08 LAB — BASIC METABOLIC PANEL
ANION GAP: 11 (ref 5–15)
BUN: 20 mg/dL (ref 6–20)
CO2: 21 mmol/L — ABNORMAL LOW (ref 22–32)
CREATININE: 1.77 mg/dL — AB (ref 0.61–1.24)
Calcium: 9.2 mg/dL (ref 8.9–10.3)
Chloride: 111 mmol/L (ref 101–111)
GFR calc Af Amer: 43 mL/min — ABNORMAL LOW (ref >60)
GFR calc non Af Amer: 37 mL/min — ABNORMAL LOW (ref >60)
GLUCOSE: 167 mg/dL — AB (ref 65–99)
POTASSIUM: 4.1 mmol/L (ref 3.5–5.1)
SODIUM: 143 mmol/L (ref 135–145)

## 2015-10-08 LAB — CBC
HCT: 40.7 % (ref 39.0–52.0)
Hemoglobin: 13.3 g/dL (ref 13.0–17.0)
MCH: 28.5 pg (ref 26.0–34.0)
MCHC: 32.7 g/dL (ref 30.0–36.0)
MCV: 87.2 fL (ref 78.0–100.0)
PLATELETS: 160 10*3/uL (ref 150–400)
RBC: 4.67 MIL/uL (ref 4.22–5.81)
RDW: 14.7 % (ref 11.5–15.5)
WBC: 9.1 10*3/uL (ref 4.0–10.5)

## 2015-10-08 MED ORDER — CARVEDILOL 12.5 MG PO TABS: 25.0000 mg | ORAL_TABLET | Freq: Two times a day (BID) | ORAL | Status: DC

## 2015-10-08 MED ORDER — ATORVASTATIN CALCIUM 40 MG PO TABS: 40.0000 mg | ORAL_TABLET | Freq: Every day | ORAL | Status: DC

## 2015-10-08 MED ORDER — PANTOPRAZOLE SODIUM 40 MG PO TBEC: 40.0000 mg | DELAYED_RELEASE_TABLET | Freq: Every day | ORAL | Status: DC

## 2015-10-08 MED ORDER — CARVEDILOL 25 MG PO TABS: 25.0000 mg | ORAL_TABLET | Freq: Two times a day (BID) | ORAL | Status: DC

## 2015-10-08 MED ORDER — ATORVASTATIN CALCIUM 20 MG PO TABS: 20.0000 mg | ORAL_TABLET | Freq: Every day | ORAL | Status: DC

## 2015-10-08 NOTE — Discharge Instructions (Addendum)
° °  STOP TAKING THE FOLLOWING MEDICATIONS:  OMEPRAZOLE (PRILOSEC)

## 2015-10-08 NOTE — Telephone Encounter (Signed)
New message       TCM appt on 10-16-15 with Richardson Dopp

## 2015-10-08 NOTE — Progress Notes (Signed)
Patient Name: Ronald [name] [facility] Date of Encounter: 10/08/2015  Active Problems:   Coronary artery disease   Hypertensive heart disease without CHF   HLD (hyperlipidemia)   Unstable angina ([place])   Acute renal failure superimposed on stage 3 chronic kidney disease ([place])   Dilated cardiomyopathy ([place])   Coronary artery disease involving native coronary artery of native heart with unstable angina pectoris [facility])   Primary Cardiologist: Dr. Angelena Form Patient Profile: Ronald [name] is a 70 year old male with a past medical history of CAD s/p CABG in 1996 (LIMA-LAD, SVG-circumflex, SVG-RCA, SVG-diagonal) with redo bypass in 2007 (SVG-OM, SVG-RCA), ischemic cardiomyopathy, CKD, DM 2, HTN, HLD, renal artery stenosis, COPD, GERD. Had unstable angina on 10/06/15, left heart cath on 10/07/15, received PTCA/DES x1 to distal body of SVG to diagonal.    SUBJECTIVE:Feels great, wants to go home. No chest pain or SOB.    OBJECTIVE Filed Vitals:   10/07/15 1343 10/07/15 1535 10/07/15 2000 10/08/15 0445  BP: 158/63 159/69 155/63 145/63  Pulse: 68 74 78 74  Temp: 97.7 F (36.5 C) 97.5 F (36.4 C) 98 F (36.7 C) 98.5 F (36.9 C)  TempSrc: Oral Oral Oral Oral  Resp: 18 20 23 21   Height:      Weight:    231 lb 7.7 oz (105 kg)  SpO2: 99% 98% 98% 98%    Intake/Output Summary (Last 24 hours) at 10/08/15 0747 Last data filed at 10/07/15 2357  Gross per 24 hour  Intake 531.67 ml  Output   1950 ml  Net -1418.33 ml   Filed Weights   10/06/15 1328 10/07/15 0500 10/08/15 0445  Weight: 231 lb 1.6 oz (104.826 kg) 231 lb (104.781 kg) 231 lb 7.7 oz (105 kg)    PHYSICAL EXAM General: Well developed, well nourished, male in no acute distress. Head: Normocephalic, atraumatic.  Neck: Supple without bruits, No JVD. Lungs:  Resp regular and unlabored, CTA. Heart: RRR, S1, S2, no S3, S4, or murmur; no rub. Abdomen: Soft, non-tender, non-distended, BS + x 4.  Extremities: No clubbing, cyanosis, No edema.    Neuro: Alert and oriented X 3. Moves all extremities spontaneously. Psych: Normal affect.  LABS: CBC: Recent Labs  10/06/15 1441 10/07/15 0559  WBC 7.9 7.0  NEUTROABS 5.2  --   HGB 13.8 13.1  HCT 41.7 39.7  MCV 87.8 87.1  PLT 167 145*   INR: Recent Labs  10/06/15 1441  INR [id number]   Basic Metabolic Panel: Recent Labs  10/06/15 1441 10/07/15 0559  NA 142 140  K 5.1 4.5  CL 108 107  CO2 26 20*  GLUCOSE 86 115*  BUN 27* 20  CREATININE 2.07* 1.60*  CALCIUM 9.4 8.7*  MG 2.5*  --    Liver Function Tests: Recent Labs  10/06/15 1441  AST 19  ALT 16*  ALKPHOS 151*  BILITOT 1.1  PROT 6.5  ALBUMIN 3.8   Cardiac Enzymes: Recent Labs  10/06/15 1441 10/06/15 1859 10/07/15 0133  TROPONINI 0.03 0.03 0.04*   Thyroid Function Tests: Recent Labs  10/06/15 1441  TSH 2.212     Current facility-administered medications:  .  0.9 %  sodium chloride infusion, 250 mL, Intravenous, PRN, Burnell Blanks, MD .  acetaminophen (TYLENOL) tablet 650 mg, 650 mg, Oral, Q4H PRN, Burnell Blanks, MD, 650 mg at 10/07/15 1731 .  allopurinol (ZYLOPRIM) tablet 100 mg, 100 mg, Oral, QHS, Burnell Blanks, MD, 100 mg at 10/07/15 2130 .  amLODipine (  NORVASC) tablet 5 mg, 5 mg, Oral, Daily, Burnell Blanks, MD, 5 mg at 10/07/15 1710 .  aspirin EC tablet 81 mg, 81 mg, Oral, Daily, Burnell Blanks, MD .  atorvastatin (LIPITOR) tablet 10 mg, 10 mg, Oral, q1800, Burnell Blanks, MD, 10 mg at 10/07/15 1711 .  carvedilol (COREG) tablet 12.5 mg, 12.5 mg, Oral, BID WC, Burnell Blanks, MD, 12.5 mg at 10/07/15 1711 .  clopidogrel (PLAVIX) tablet 75 mg, 75 mg, Oral, Daily, Burnell Blanks, MD .  hydrALAZINE (APRESOLINE) injection 10 mg, 10 mg, Intravenous, Q4H PRN, Erma Heritage, PA, 10 mg at 10/07/15 1904 .  multivitamin with minerals tablet 1 tablet, 1 tablet, Oral, Daily, Skeet Simmer, RPH .  nitroGLYCERIN (NITROSTAT) SL tablet 0.4  mg, 0.4 mg, Sublingual, Q5 min PRN, Burnell Blanks, MD .  ondansetron [facility]) injection 4 mg, 4 mg, Intravenous, Q6H PRN, Burnell Blanks, MD .  pantoprazole (PROTONIX) EC tablet 40 mg, 40 mg, Oral, Daily, Burnell Blanks, MD, 40 mg at 10/07/15 1711 .  sodium bicarbonate tablet 650 mg, 650 mg, Oral, BID, Burnell Blanks, MD, 650 mg at 10/07/15 2130 .  sodium chloride flush (NS) 0.9 % injection 3 mL, 3 mL, Intravenous, Q12H, Burnell Blanks, MD, 3 mL at 10/07/15 1800 .  sodium chloride flush (NS) 0.9 % injection 3 mL, 3 mL, Intravenous, PRN, Burnell Blanks, MD .  traMADol Veatrice Bourbon) tablet 50 mg, 50 mg, Oral, Q6H PRN, Erma Heritage, [place], 50 mg at 10/07/15 2133    TELE:  NSR      ECG: NSR  Coronary Stent Intervention Left Heart Cath and Cors/Grafts Angiography 10/07/15  Ost Cx to Prox Cx lesion, 100% stenosed.  SVG is moderate in size.  There is mild diffuse disease in the graft.  Mid RCA lesion, 100% stenosed.  Origin lesion, 25% stenosed. The lesion was previously treated with a stent (unknown type).  SVG .  Origin to Prox Graft lesion, 40% stenosed.  was injected is normal in caliber.  Mid LAD lesion, 100% stenosed.  SVG .  There is mild diffuse disease in the graft.  SVG is large.  Prox Graft to Mid Graft lesion, 30% stenosed.  Prox Graft to Mid Graft lesion, 40% stenosed.  Origin lesion, 50% stenosed.  Prox Graft to Mid Graft lesion, 40% stenosed.  Dist Graft to Insertion lesion, 99% stenosed. Post intervention, there is a 0% residual stenosis.  1. 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  2. LAD occluded proximally. The mid and distal vessel fills from the patent IMA graft. The Diagonal fills from the patent SVG (there is a severe stenosis at this anastomosis).  3. Circumflex is occluded proximally. The vein graft to the OM is patent.  4. RCA is occluded proximally. The vein grafts to the distal  RCA branches (PDA and PLA) are patent with diffuse moderate disease in the bodies of the vein grafts.  5. Successful PTCA/DES x 1 distal body of SVG to Diagonal.   Recommendations: Continue ASA and Plavix for lifetime. Continue beta blocker and statin.    Current Medications:  . allopurinol  100 mg Oral QHS  . amLODipine  5 mg Oral Daily  . aspirin EC  81 mg Oral Daily  . atorvastatin  10 mg Oral q1800  . carvedilol  12.5 mg Oral BID WC  . clopidogrel  75 mg Oral Daily  . multivitamin with minerals  1 tablet Oral  Daily  . pantoprazole  40 mg Oral Daily  . sodium bicarbonate  650 mg Oral BID  . sodium chloride flush  3 mL Intravenous Q12H      ASSESSMENT AND PLAN: Active Problems:   Coronary artery disease   Hypertensive heart disease without CHF   HLD (hyperlipidemia)   Unstable angina (HCC)   Acute renal failure superimposed on stage 3 chronic kidney disease (HCC)   Dilated cardiomyopathy (HCC)   Coronary artery disease involving native coronary artery of native heart with unstable angina pectoris ([place])  1. Coronary artery disease involving native coronary artery of native heart with unstable angina pectoris: Patient went to cath lab yesterday, underwent PTCA/DES x 1 to distal body of SVG to diagonal. He will be on ASA and Plavix for lifelong therapy. He is on beta blocker and statin. Right groin site is stable.   2. HLD: last LDL is 80 11/2014.  Will increase atorvastatin to 40mg  daily and repeat FLP and ALT in 6 weeks.   3. Hypertensive heart disease: BP elevated, SBP in 150-170 range. Continue amlodipine. Increase Coreg to 25mg  BID.    4. CKD stage III: Creatinine slightly bumped to 1.77 today. Baseline is 2.0-2.5.   5. Ischemic dilated cardiomyopathy: last EF was 50-55%.  He will need Echo, last echo was in 2014. Can do this outpatient.   Signed, Arbutus Leas , NP 7:47 AM 10/08/2015 Pager [phone]   Patient seen and independently examined with Jettie Booze, NP.  We discussed all aspects of the encounter. I agree with the assessment and plan as stated above.  Patient is doing well post PCI of the SVG to Diag. Continue DAPT for life with ASA/Plavix.  Atorvastatin increased to 40mg  daily for dyslipidemia.  BP still running high so will increase Coreg to 25mg  BID.  His LVEDP was elevated at time of cath but clinically he appears euvolemic.  This is consistent with diastolic dysfunction and in setting of elevated BP would benefit from uptitration of BB to slow heart rate and increase diastolic filling time.  Creatinine slightly bumped but stable and lower than admission creatinine.  He is stable for discharge home today with early TOC followup including BMET.  He will need an FLP and ALT in 6 weeks.   Signed, Fransico Him, MD [facility]  10/08/2015

## 2015-10-08 NOTE — Discharge Summary (Signed)
Discharge Summary    Patient ID: Ronald [name] [facility],  MRN: [id number], DOB/AGE: [birth year] 70 y.o.  Admit date: 10/06/2015 Discharge date: 10/08/2015  Primary Care Provider: Wilhemena [name] Primary Cardiologist: Dr. Angelena [name]   Discharge Diagnoses    Active Problems:   Coronary artery disease   Hypertensive heart disease without CHF   HLD (hyperlipidemia)   Unstable angina (HCC)   Acute renal failure superimposed on stage 3 chronic kidney disease (HCC)   Dilated cardiomyopathy (HCC)   Coronary artery disease involving native coronary artery of native heart with unstable angina pectoris (HCC)   Allergies Allergies  Allergen Reactions  . Ciprofloxacin     GI upset  . Hydrochlorothiazide Other (See Comments)    dehydrates Dehydration  . Hydrocodone     Stomach upset Stomach upset  . Hydrocodone-Acetaminophen     Stomach upset  . Hydrocodone-Acetaminophen Nausea Only  . Sulfa Antibiotics   . Sulfacetamide Sodium   . Penicillins Hives and Rash    Has patient had a PCN reaction causing immediate rash, facial/tongue/throat swelling, SOB or lightheadedness with hypotension: YES Has patient had a PCN reaction causing severe rash involving mucus membranes or skin necrosis: NO Has patient had a PCN reaction that required hospitalization NO Has patient had a PCN reaction occurring within the last 10 years:NO If all of the above answers are "NO", then may proceed with Cephalosporin use.    Diagnostic Studies/Procedures  Coronary Stent Intervention  Left Heart Cath and Cors/Grafts Angiography     Ost Cx to Prox Cx lesion, 100% stenosed.  SVG is moderate in size.  There is mild diffuse disease in the graft.  Mid RCA lesion, 100% stenosed.  Origin lesion, 25% stenosed. The lesion was previously treated with a stent (unknown type).  SVG .  Origin to Prox Graft lesion, 40% stenosed.  was injected is normal in caliber.  Mid LAD lesion, 100% stenosed.  SVG  .  There is mild diffuse disease in the graft.  SVG is large.  Prox Graft to Mid Graft lesion, 30% stenosed.  Prox Graft to Mid Graft lesion, 40% stenosed.  Origin lesion, 50% stenosed.  Prox Graft to Mid Graft lesion, 40% stenosed.  Dist Graft to Insertion lesion, 99% stenosed. Post intervention, there is a 0% residual stenosis.  1. 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  2. LAD occluded proximally. The mid and distal vessel fills from the patent IMA graft. The Diagonal fills from the patent SVG (there is a severe stenosis at this anastomosis).  3. Circumflex is occluded proximally. The vein graft to the OM is patent.  4. RCA is occluded proximally. The vein grafts to the distal RCA branches (PDA and PLA) are patent with diffuse moderate disease in the bodies of the vein grafts.  5. Successful PTCA/DES x 1 distal body of SVG to Diagonal.   Recommendations: Continue ASA and Plavix for lifetime. Continue beta blocker and statin.    _____________   History of Present Illness   Ronald [name] is a 70 year old male with a past medical history of coronary artery disease s/p CABG in 1996 (LIMA-LAD, SVG-circumflex, SVG-RCA, SVG-diagonal) with redo bypass in 2007 (SVG-OM, SVG-RCA),CKD, DM 2, HTN, HLD, renal artery stenosis, COPD, GERD.       Hospital Course  He was mowing the lawn on a riding lawnmower on 10/02/2015 and feel well.  He had some chest pressure After mowing and called EMS. EMS told him to take for  aspirin and this resolved his pain. He did not come to the hospital. Then on 10/05/2015 he had chest pain at rest that lasted for about 10 minutes. He was seen in the office where he continued to have chest tightness with radiation to the left arm. He was sent to [facility] for admission. With plans for cardiac catheterization the next day.  Troponin was negative 3. EKG was without change.   He underwent left heart cath on 10/07/2015. Report above. Had  successful PTCA/DES 1 to the distal body of SVG to diagonal. He will need lifelong aspirin and Plavix.  He was hypertensive following his cath. His LVEDP was elevated at 22. His Coreg was increased to 25mg  BID, he has known diastolic dysfunction. His atorvastatin will be increased to 40mg  daily.     _____________  Discharge Vitals Blood pressure 134/69, pulse 74, temperature 98 F (36.7 C), temperature source Oral, resp. rate 24, height 5\' 10"  (1.778 m), weight 231 lb 7.7 oz (105 kg), SpO2 97 %.  Filed Weights   10/06/15 1328 10/07/15 0500 10/08/15 0445  Weight: 231 lb 1.6 oz (104.826 kg) 231 lb (104.781 kg) 231 lb 7.7 oz (105 kg)    Labs & Radiologic Studies     CBC  Recent Labs  10/06/15 1441 10/07/15 0559 10/08/15 0808  WBC 7.9 7.0 9.1  NEUTROABS 5.2  --   --   HGB 13.8 13.1 13.3  HCT 41.7 39.7 40.7  MCV 87.8 87.1 87.2  PLT 167 145* 0000000   Basic Metabolic Panel  Recent Labs  10/06/15 1441 10/07/15 0559 10/08/15 0808  NA 142 140 143  K 5.1 4.5 4.1  CL 108 107 111  CO2 26 20* 21*  GLUCOSE 86 115* 167*  BUN 27* 20 20  CREATININE 2.07* 1.60* 1.77*  CALCIUM 9.4 8.7* 9.2  MG 2.5*  --   --    Liver Function Tests  Recent Labs  10/06/15 1441  AST 19  ALT 16*  ALKPHOS 151*  BILITOT 1.1  PROT 6.5  ALBUMIN 3.8   Cardiac Enzymes  Recent Labs  10/06/15 1441 10/06/15 1859 10/07/15 0133  TROPONINI 0.03 0.03 0.04*   Thyroid Function Tests  Recent Labs  10/06/15 1441  TSH 2.212    No results found.  Disposition   Pt is being discharged home today in good condition.  Follow-up Plans & Appointments    Follow-up Information    Follow up with Ronald Dopp, PA-C On 10/16/2015.   Specialties:  Cardiology, Physician Assistant   Why:  for follow up at 10:30am    Contact information:   [address] [place] [address] [phone]      Discharge Instructions    Amb Referral to Cardiac Rehabilitation    Complete by:  As  directed   Diagnosis:  Coronary Stents     Diet - low sodium heart healthy    Complete by:  As directed      Discharge instructions    Complete by:  As directed   NO HEAVY LIFTING OR SEXUAL ACTIVITY for 7 DAYS. NO DRIVING for 3-5 DAYS. NO SOAKING BATHS, HOT TUBS, POOLS, ETC., for 7 DAYS  Groin Site Care Refer to this sheet in the next few weeks. These instructions provide you with information on caring for yourself after your procedure. Your caregiver may also give you more specific instructions. Your treatment has been planned according to current medical practices, but problems sometimes occur. Call your caregiver  if you have any problems or questions after your procedure. HOME CARE INSTRUCTIONS You may shower 24 hours after the procedure. Remove the bandage (dressing) and gently wash the site with plain soap and water. Gently pat the site dry.  Do not apply powder or lotion to the site.  Do not sit in a bathtub, swimming pool, or whirlpool for 5 to 7 days.  No bending, squatting, or lifting anything over 10 pounds (4.5 kg) as directed by your caregiver.  Inspect the site at least twice daily.  Do not drive home if you are discharged the same day of the procedure. Have someone else drive you.  You may drive 24 hours after the procedure unless otherwise instructed by your caregiver.  What to expect: Any bruising will usually fade within 1 to 2 weeks.  Blood that collects in the tissue (hematoma) may be painful to the touch. It should usually decrease in size and tenderness within 1 to 2 weeks.  SEEK IMMEDIATE MEDICAL CARE IF: You have unusual pain at the groin site or down the affected leg.  You have redness, warmth, swelling, or pain at the groin site.  You have drainage (other than a small amount of blood on the dressing).  You have chills.  You have a fever or persistent symptoms for more than 72 hours.  You have a fever and your symptoms suddenly get worse.  Your leg becomes pale,  cool, tingly, or numb.  You have heavy bleeding from the site. Hold pressure on the site. .     Increase activity slowly    Complete by:  As directed            Discharge Medications   Current Discharge Medication List    START taking these medications   Details  pantoprazole (PROTONIX) 40 MG tablet Take 1 tablet (40 mg total) by mouth daily. Qty: 30 tablet, Refills: 12      CONTINUE these medications which have CHANGED   Details  atorvastatin (LIPITOR) 40 MG tablet Take 1 tablet (40 mg total) by mouth daily at 6 PM. Qty: 30 tablet, Refills: 12    carvedilol (COREG) 25 MG tablet Take 1 tablet (25 mg total) by mouth 2 (two) times daily with a meal. Qty: 60 tablet, Refills: 12      CONTINUE these medications which have NOT CHANGED   Details  acetaminophen (TYLENOL) 325 MG tablet Take 650 mg by mouth every 6 (six) hours as needed for mild pain.    Associated Diagnoses: Low back pain, unspecified back pain laterality, with sciatica presence unspecified    allopurinol (ZYLOPRIM) 100 MG tablet Take 1 tablet by mouth at  bedtime Qty: 90 tablet, Refills: 3    amLODipine (NORVASC) 5 MG tablet 1 TABLET, ORAL, DAILY Qty: 90 tablet, Refills: 4    aspirin 81 MG tablet Take 81 mg by mouth every morning.     clopidogrel (PLAVIX) 75 MG tablet Take 1 tablet (75 mg total) by mouth daily. Qty: 90 tablet, Refills: 3   Associated Diagnoses: Coronary artery disease involving coronary bypass graft of native heart with unstable angina pectoris (HCC)    magnesium oxide (MAG-OX) 400 (241.3 MG) MG tablet TAKE 1 TABLET BY MOUTH TWO TIMES DAILY. Qty: 180 tablet, Refills: 4    Multiple Vitamin (MULTIVITAMIN) tablet Take 1 tablet by mouth every morning.     sodium bicarbonate 650 MG tablet Take 650 mg by mouth 2 (two) times daily.   Associated Diagnoses: Coronary artery  disease involving coronary bypass graft of native heart with unstable angina pectoris (HCC)    nitroGLYCERIN (NITROSTAT) 0.4  MG SL tablet Place 1 tablet (0.4 mg total) under the tongue every 5 (five) minutes as needed. May repeat up to three times. Qty: 25 tablet, Refills: 6      STOP taking these medications     omeprazole (PRILOSEC) 20 MG capsule          Aspirin prescribed at discharge? Yes High Intensity Statin Prescribed? (Lipitor 40-80mg  or Crestor 20-40mg ): Yes Beta Blocker Prescribed? Yes For EF 45% or less, Was ACEI/ARB Prescribed? No  ADP Receptor Inhibitor Prescribed? Yes For EF <40%, Aldosterone Inhibitor Prescribed? Np Was EF assessed during THIS hospitalization? No  Was Cardiac Rehab II ordered? (Included Medically managed Patients):Yes   Outstanding Labs/Studies  Will need BMP at follow up visit and FLP and ALT in 6 weeks.   Duration of Discharge Encounter   Greater than 30 minutes including physician time.  Signed, Arbutus Leas NP 10/08/2015, 1:20 PM

## 2015-10-08 NOTE — Progress Notes (Signed)
CARDIAC REHAB PHASE I   PRE:  Rate/Rhythm: 51 SR c/ frequent PVCs  BP:  Sitting: 184/79        SaO2: 95 RA  MODE:  Ambulation: 500 ft   POST:  Rate/Rhythm: 98 SR c/ PVCs  BP:  Sitting: 183/81         SaO2: 98 RA  Pt ambulated 500 ft on RA, handheld assist, fairly steady gait, tolerated fairly well.  Pt denied any complaints, did seem to have mild DOE, declined rest stop, states his activity is somewhat limited due to back and hip pain. Completed PCI/stent education with pt and wife at bedside. Reviewed risk factors, anti-platelet therapy, stent card, activity restrictions, ntg, exercise, heart healthy diet, carb counting, portion control, sodium restrictions and phase 2 cardiac rehab. Pt verbalized understanding, states he knows he needs to make changes to his diet. Pt agrees to phase 2 cardiac rehab referral, will send to [facility] per pt request. Pt to bed per pt request after walk, call bell within reach.   Parnell, RN, BSN 10/08/2015 9:28 AM

## 2015-10-09 NOTE — Telephone Encounter (Signed)
Patient contacted regarding discharge from [facility] on May 10,2017.  Patient understands to follow up with provider Richardson Dopp, PA  on 10/16/15 at 10:30AM at [facility]. Patient understands discharge instructions? yes Patient understands medications and regiment? yes Patient understands to bring all medications to this visit? yes

## 2015-10-15 NOTE — Progress Notes (Signed)
Cardiology Office Note:    Date:  10/16/2015   ID:  Ronald [name], DOB [birth year], MRN [id number]  PCP:  Ronald Durie, MD  Cardiologist:  Dr. Lauree Chandler   Electrophysiologist:  N/a Nephrologist: Dr. Candiss Norse [facility] in [place])  Referring MD: Ronald [name].,*   Chief Complaint  Patient presents with  . Hospitalization Follow-up    [place] >> s/p PCI    History of Present Illness:     Ronald [name] is a 70 y.o. male with a hx of CAD s/p CABG in 1996 (LIMA-LAD, SVG-circumflex, SVG-RCA, SVG-diagonal) with redo bypass in 2007 (SVG-OM, SVG-RCA), ischemic cardiomyopathy, CKD, DM 2, HTN, HLD, renal artery stenosis, COPD, GERD.  Prior patient of Dr.  Bing Quarry. Admitted in 7/16 with non-STEMI. LHC demonstrated severe disease in the SVG-OM1 which was treated with a Synergy DES. He had significant dyspnea with Brilinta estranged Plavix. Last seen by Dr. Angelena Form 11/16.   Admitted from the office 5/8-5/10 with unstable angina. Cardiac markers remained normal. LHC demonstrated severe 3 vessel CAD with all 5 bypass grafts patent. There was significant stenosis in the SVG-diagonal which was treated with a Synergy DES. Lifelong dual antiplatelet Rx aspirin and Plavix was recommended. Post PCI course was fairly uneventful.  His Coreg was increased due to uncontrolled BP post PCI.  Lipitor was increased 10 >> 40 QD.  Returns for FU.  Here with his wife. He is doing well.  No further chest pain. Denies dyspnea, syncope, orthopnea, PND, edema.  He feels lethargic since DC.  Has a long hx of abdominal pain and constipation.  This has been worse lately as well.   Past Medical History  Diagnosis Date  . CAD (coronary artery disease) 2007    a. s/p CABG- IMA-LAD, VG-Cx, VG-RCA, VG-diag in 1999. B. sp redo CABG- VG-OM, VG-RCA in 2007 due to VG disease. c. NSTEMI 11/2014 s/p DES to SVG-OM from the Y graft.d. PTCA/DES x 1 distal body of SVG to Diagonal.09/2015  . Ischemic cardiomyopathy  2006    EF 40% to 50% by 2D echo in 2006;  Echo 12/31/12: Mild LVH, EF 50-55%, normal wall motion.   . CKD (chronic kidney disease), stage IV (HCC)     Creatinine 1.9 to 2.2  . Hyperlipidemia   . Hypertension   . Renal artery stenosis ([place])   . Emphysema   . COPD (chronic obstructive pulmonary disease) ([place])   . GERD (gastroesophageal reflux disease)   . Esophageal stricture 07/02/1998    EGD  . Dysrhythmia     bigeminy pvcs  . CHF (congestive heart failure) ([place])   . History of hiatal hernia   . Walking pneumonia 1990's  . Type II diabetes mellitus (HCC)     Diet control   . History of gout     "last flareup was in 2007" (12/13/2014)  . Chronic lower back pain   . Arthritis     "hips; back" (12/13/2014)  . Degenerative disc disease, lumbar   . Deafness in left ear   . Acute renal failure superimposed on stage 3 chronic kidney disease ([place]) 10/07/2015  . Dilated cardiomyopathy ([place]) 10/07/2015    Past Surgical History  Procedure Laterality Date  . Lung surgery  1996    "S/P CABG, had to put staple in lung after it had collapsed"  . Cardiac catheterization  "several"  . Coronary angioplasty  "several"  . Coronary angioplasty with stent placement  2005; 12/13/2014    "2; 1"  .  Coronary artery bypass graft  1996    CABG X5  . Coronary artery bypass graft  March 2007    CABG X3  . Laparoscopic cholecystectomy    . Hernia repair    . Umbilical hernia repair      w/chole  . Green light laser turp (transurethral resection of prostate  2000's    "not cancerous"  . Esophagogastroduodenoscopy (egd) with esophageal dilation  2000  . Cardiac catheterization N/A 12/13/2014    Procedure: Left Heart Cath and Coronary Angiography;  Surgeon: Jettie Booze, MD;  Location: [facility];  Service: Cardiovascular;  Laterality: N/A;  . Cardiac catheterization  12/13/2014    Procedure: Coronary Stent Intervention;  Surgeon: Jettie Booze, MD;  Location: [facility];  Service:  Cardiovascular;;  . Cardiac catheterization N/A 10/07/2015    Procedure: Left Heart Cath and Cors/Grafts Angiography;  Surgeon: Burnell Blanks, MD;  Location: [facility];  Service: Cardiovascular;  Laterality: N/A;  . Cardiac catheterization N/A 10/07/2015    Procedure: Coronary Stent Intervention;  Surgeon: Burnell Blanks, MD;  Location: [facility];  Service: Cardiovascular;  Laterality: N/A;    Current Medications: Outpatient Prescriptions Prior to Visit  Medication Sig Dispense Refill  . acetaminophen (TYLENOL) 325 MG tablet Take 650 mg by mouth every 6 (six) hours as needed for mild pain.     [name] allopurinol (ZYLOPRIM) 100 MG tablet Take 1 tablet by mouth at  bedtime 90 tablet 3  . amLODipine (NORVASC) 5 MG tablet 1 TABLET, ORAL, DAILY 90 tablet 4  . aspirin 81 MG tablet Take 81 mg by mouth every morning.     [name] atorvastatin (LIPITOR) 40 MG tablet Take 1 tablet (40 mg total) by mouth daily at 6 PM. 30 tablet 12  . carvedilol (COREG) 25 MG tablet Take 1 tablet (25 mg total) by mouth 2 (two) times daily with a meal. 60 tablet 12  . clopidogrel (PLAVIX) 75 MG tablet Take 1 tablet (75 mg total) by mouth daily. 90 tablet 3  . magnesium oxide (MAG-OX) 400 (241.3 MG) MG tablet TAKE 1 TABLET BY MOUTH TWO TIMES DAILY. 180 tablet 4  . Multiple Vitamin (MULTIVITAMIN) tablet Take 1 tablet by mouth every morning.     . nitroGLYCERIN (NITROSTAT) 0.4 MG SL tablet Place 1 tablet (0.4 mg total) under the tongue every 5 (five) minutes as needed. May repeat up to three times. 25 tablet 6  . pantoprazole (PROTONIX) 40 MG tablet Take 1 tablet (40 mg total) by mouth daily. 30 tablet 12  . sodium bicarbonate 650 MG tablet Take 650 mg by mouth 2 (two) times daily.     No facility-administered medications prior to visit.      Allergies:   Ciprofloxacin; Hydrochlorothiazide; Hydrocodone; Hydrocodone-acetaminophen; Hydrocodone-acetaminophen; Sulfa antibiotics; Sulfacetamide sodium; and  Penicillins   Social History   Social History  . Marital Status: Married    Spouse Name: N/A  . Number of Children: 2  . Years of Education: N/A   Occupational History  . Retired    Social History Main Topics  . Smoking status: Former Smoker -- 3.00 packs/day for 20 years    Types: Cigarettes    Quit date: 07/25/1986  . Smokeless tobacco: Never Used  . Alcohol Use: No  . Drug Use: No  . Sexual Activity: Not Currently   Other Topics Concern  . None   Social History Narrative   Did auto salvage work.  Family History:  The patient's family history includes Heart attack in his mother; Heart disease in his father and mother; Hyperlipidemia in his mother; Hypertension in his mother; Rheumatic fever in his father; Stroke in his mother.   ROS:   Please see the history of present illness.    ROS All other systems reviewed and are negative.   Physical Exam:    VS:  BP 122/48 mmHg  Pulse 64  Ht 5\' 10"  (1.778 m)  Wt 230 lb 9.6 oz (104.599 kg)  BMI 33.09 kg/m2   GEN: Well nourished, well developed, in no acute distress HEENT: normal Neck: no JVD, no masses Cardiac: Normal S1/S2, RRR; no murmurs, rubs, or gallops, no edema;  R groin without hematoma or bruit    Respiratory:  clear to auscultation bilaterally; no wheezing, rhonchi or rales GI: soft, nontender, nondistended MS: no deformity or atrophy Skin: warm and dry Neuro: No focal deficits  Psych: Alert and oriented x 3, normal affect  Wt Readings from Last 3 Encounters:  10/16/15 230 lb 9.6 oz (104.599 kg)  10/08/15 231 lb 7.7 oz (105 kg)  10/06/15 233 lb (105.688 kg)      Studies/Labs Reviewed:     EKG:  EKG is  ordered today.  The ekg ordered today demonstrates NSR, HR 65, normal axis, NSSTTW changes, QTc 407 ms, PVCs  Recent Labs: 10/06/2015: ALT 16*; Magnesium 2.5*; TSH 2.212 10/08/2015: BUN 20; Creatinine, Ser 1.77*; Hemoglobin 13.3; Platelets 160; Potassium 4.1; Sodium 143   Recent Lipid Panel      Component Value Date/Time   CHOL 135 12/13/2014 0423   TRIG 124 12/13/2014 0423   HDL 30* 12/13/2014 0423   CHOLHDL 4.5 12/13/2014 0423   VLDL 25 12/13/2014 0423   LDLCALC 80 12/13/2014 0423    Additional studies/ records that were reviewed today include:    LHC 10/07/15 LAD mid 100% LCx ostial 100% RCA mid 100% SVG-RPLB1 stent patent with 25% ISR, proximal 40% SVG-RPDA 50%, 40%, 40% LIMA-LAD okay SVG-D2 proximal 30%, distal 99% SVG-OM 2 stent patent PCI: 2.5 x 16 mm Synergy DES to the SVG-D2  ABI 12/16 R 0.98; L 0.90  Echo 8/14 Mild LVH, EF 50-55%, normal wall motion  Myoview 8/14 No ischemia or scar, EF 53%   ASSESSMENT:     1. Coronary artery disease involving native coronary artery of native heart without angina pectoris   2. Hypertensive heart disease without CHF   3. HLD (hyperlipidemia)   4. CKD (chronic kidney disease) stage 3, GFR 30-59 ml/min   5. Type 2 diabetes mellitus with diabetic nephropathy, unspecified long term insulin use status ([place])     PLAN:     In order of problems listed above:  1. CAD - He is status post CABG in 1996 and redo bypass in 2007, DES to the SVG-OM1 in 7/16 in the setting of non-STEMI and recent admission with unstable angina and PCI with DES to the SVG-D2. He is doing well without recurrent angina. He needs dual antiplatelet therapy, lifelong. He understands this. Continue aspirin, Plavix, statin, beta blocker, amlodipine.  2. HTN - Blood pressure is controlled. He has felt somewhat weak since discharge from the hospital. This may be secondary to the increased dose of beta blocker. He also has a long history of abdominal issues and this may be playing a role as well. I recommended that he see how he feels over the next week. If he continues to fill poorly, he can decrease his  morning dose of Coreg to 12.5 mg and continue 25 mg in the evening. He will need to monitor his blood pressure and call us if it goes above target.  3. HL -  Continue atorvastatin 40. Obtain lipids and LFTs in 2 months with PCP.  4. CKD - Creatinine was stable during his hospital station with cardiac catheterization. Obtain repeat BMET today.  5. Diabetes - Well controlled with A1c 6.2 in 4/17.   Medication Adjustments/Labs and Tests Ordered: Current medicines are reviewed at length with the patient today.  Concerns regarding medicines are outlined above.  Medication changes, Labs and Tests ordered today are outlined in the Patient Instructions noted below. Patient Instructions  Medication Instructions:  Your physician recommends that you continue on your current medications as directed. Please refer to the Current Medication list given to you today. Labwork: 1. BMET TODAY 2. YOU HAVE BEEN GIVEN AN RX FOR LAB WORK TO BE DONE IN July WITH YOUR PRIMARY CARE PHYSICIAN; FASTING LIPID AND LIVER PANEL; PLEASE HAVE THE RESULTS FAXED TO [place], [place] Testing/Procedures: NONE Follow-Up: DR. Angelena Form 01/28/16 @ 4:15 Any Other Special Instructions Will Be Listed Below (If Applicable) CONTINUE THE COREG 25 MG TWICE DAILY; IF AFTER 1 MORE WEEK YOU STILL FEEL WEAK THEN DECREASE THE MORNING DOSE TO 1/2 TABLET (12.5) AND CONTINUE THE PM 25 MG DOSE ; MONITOR BP AND CALL IF READINGS CONTINUOUSLY ABOVE 140/90 [phone] If you need a refill on your cardiac medications before your next appointment, please call your pharmacy.    Signed, Richardson Dopp, PA-C  10/16/2015 11:18 AM    [facility] [address], [place], [place]  [address] Phone: [phone]; Fax: [phone]

## 2015-10-16 ENCOUNTER — Ambulatory Visit (INDEPENDENT_AMBULATORY_CARE_PROVIDER_SITE_OTHER): Payer: Medicare Other | Admitting: Physician Assistant

## 2015-10-16 ENCOUNTER — Telehealth: Payer: Self-pay | Admitting: *Deleted

## 2015-10-16 ENCOUNTER — Encounter: Payer: Self-pay | Admitting: Physician Assistant

## 2015-10-16 VITALS — BP 122/48 | HR 64 | Ht 70.0 in | Wt 230.6 lb

## 2015-10-16 DIAGNOSIS — E1121 Type 2 diabetes mellitus with diabetic nephropathy: Secondary | ICD-10-CM

## 2015-10-16 DIAGNOSIS — I2510 Atherosclerotic heart disease of native coronary artery without angina pectoris: Secondary | ICD-10-CM | POA: Diagnosis not present

## 2015-10-16 DIAGNOSIS — N183 Chronic kidney disease, stage 3 unspecified: Secondary | ICD-10-CM

## 2015-10-16 DIAGNOSIS — E785 Hyperlipidemia, unspecified: Secondary | ICD-10-CM | POA: Diagnosis not present

## 2015-10-16 DIAGNOSIS — I119 Hypertensive heart disease without heart failure: Secondary | ICD-10-CM

## 2015-10-16 LAB — BASIC METABOLIC PANEL
BUN: 31 mg/dL — AB (ref 7–25)
CALCIUM: 9.3 mg/dL (ref 8.6–10.3)
CHLORIDE: 103 mmol/L (ref 98–110)
CO2: 24 mmol/L (ref 20–31)
CREATININE: 2.41 mg/dL — AB (ref 0.70–1.18)
Glucose, Bld: 83 mg/dL (ref 65–99)
Potassium: 5.1 mmol/L (ref 3.5–5.3)
Sodium: 139 mmol/L (ref 135–146)

## 2015-10-16 NOTE — Telephone Encounter (Signed)
Lmtcb to go over lab results and recommendations.  

## 2015-10-16 NOTE — Patient Instructions (Addendum)
Medication Instructions:  Your physician recommends that you continue on your current medications as directed. Please refer to the Current Medication list given to you today. Labwork: 1. BMET TODAY 2. YOU HAVE BEEN GIVEN AN RX FOR LAB WORK TO BE DONE IN July WITH YOUR PRIMARY CARE PHYSICIAN; FASTING LIPID AND LIVER PANEL; PLEASE HAVE THE RESULTS FAXED TO [place], [place] Testing/Procedures: NONE Follow-Up: DR. Angelena Form 01/28/16 @ 4:15 Any Other Special Instructions Will Be Listed Below (If Applicable) CONTINUE THE COREG 25 MG TWICE DAILY; IF AFTER 1 MORE WEEK YOU STILL FEEL WEAK THEN DECREASE THE MORNING DOSE TO 1/2 TABLET (12.5) AND CONTINUE THE PM 25 MG DOSE ; MONITOR BP AND CALL IF READINGS CONTINUOUSLY ABOVE 140/90 [phone] If you need a refill on your cardiac medications before your next appointment, please call your pharmacy.

## 2015-10-17 MED ORDER — CARVEDILOL 25 MG PO TABS: 12.5000 mg | ORAL_TABLET | Freq: Two times a day (BID) | ORAL | Status: DC

## 2015-10-17 NOTE — Telephone Encounter (Signed)
Follow Up   Pt wife returned call. Request a call back

## 2015-10-17 NOTE — Telephone Encounter (Signed)
Follow up:  Pt's wife would a call back about pt's lab results and medication changes.

## 2015-10-17 NOTE — Telephone Encounter (Signed)
Pt notified of lab results and recommendations. pt asked for me to please go this with his wife (DPR on file), since he forets things easily and his wife takes care of his medications for him. He will have wife cb when gets home from work 3 pm .

## 2015-10-17 NOTE — Telephone Encounter (Signed)
DPR for wife on file, who has been notified of pt's lab results and recommendations. Wife aware PA decreasing Coreg to 12.5 mg BID, BMET 5/22, monitor BP, push fluids, H20, juice. Wife agreeable to plan of care.

## 2015-10-20 ENCOUNTER — Other Ambulatory Visit (INDEPENDENT_AMBULATORY_CARE_PROVIDER_SITE_OTHER): Payer: Medicare Other

## 2015-10-20 DIAGNOSIS — N183 Chronic kidney disease, stage 3 unspecified: Secondary | ICD-10-CM

## 2015-10-20 LAB — BASIC METABOLIC PANEL
BUN: 36 mg/dL — ABNORMAL HIGH (ref 7–25)
CALCIUM: 9.7 mg/dL (ref 8.6–10.3)
CO2: 24 mmol/L (ref 20–31)
CREATININE: 2.17 mg/dL — AB (ref 0.70–1.18)
Chloride: 106 mmol/L (ref 98–110)
Glucose, Bld: 83 mg/dL (ref 65–99)
Potassium: 5.3 mmol/L (ref 3.5–5.3)
Sodium: 140 mmol/L (ref 135–146)

## 2015-10-21 ENCOUNTER — Telehealth: Payer: Self-pay | Admitting: *Deleted

## 2015-10-21 DIAGNOSIS — N183 Chronic kidney disease, stage 3 unspecified: Secondary | ICD-10-CM

## 2015-10-21 NOTE — Telephone Encounter (Signed)
DPR  for wife. Pt is deaf. Wife notified of lab results by phone. Agreeable to plan of care. BMET 6/26.

## 2015-10-21 NOTE — Telephone Encounter (Signed)
Follow up   Pt wife is calling   She verbalized that she is returning call

## 2015-10-21 NOTE — Telephone Encounter (Signed)
Lmtcb to go over lab resultsa nd recommendation for repeat bmet in 1 month.

## 2015-10-30 DIAGNOSIS — N138 Other obstructive and reflux uropathy: Secondary | ICD-10-CM | POA: Diagnosis not present

## 2015-10-30 DIAGNOSIS — R339 Retention of urine, unspecified: Secondary | ICD-10-CM | POA: Diagnosis not present

## 2015-10-30 DIAGNOSIS — N401 Enlarged prostate with lower urinary tract symptoms: Secondary | ICD-10-CM | POA: Diagnosis not present

## 2015-10-30 DIAGNOSIS — B3749 Other urogenital candidiasis: Secondary | ICD-10-CM | POA: Diagnosis not present

## 2015-11-03 ENCOUNTER — Ambulatory Visit (INDEPENDENT_AMBULATORY_CARE_PROVIDER_SITE_OTHER): Payer: Medicare Other | Admitting: Family Medicine

## 2015-11-03 ENCOUNTER — Encounter: Payer: Self-pay | Admitting: Family Medicine

## 2015-11-03 VITALS — BP 110/60 | HR 62 | Temp 97.6°F | Resp 16 | Wt 228.0 lb

## 2015-11-03 DIAGNOSIS — I2510 Atherosclerotic heart disease of native coronary artery without angina pectoris: Secondary | ICD-10-CM | POA: Diagnosis not present

## 2015-11-03 DIAGNOSIS — M545 Low back pain: Secondary | ICD-10-CM | POA: Diagnosis not present

## 2015-11-03 DIAGNOSIS — R1012 Left upper quadrant pain: Secondary | ICD-10-CM | POA: Diagnosis not present

## 2015-11-03 DIAGNOSIS — M791 Myalgia, unspecified site: Secondary | ICD-10-CM

## 2015-11-03 DIAGNOSIS — I214 Non-ST elevation (NSTEMI) myocardial infarction: Secondary | ICD-10-CM

## 2015-11-03 DIAGNOSIS — E782 Mixed hyperlipidemia: Secondary | ICD-10-CM | POA: Diagnosis not present

## 2015-11-03 DIAGNOSIS — Z09 Encounter for follow-up examination after completed treatment for conditions other than malignant neoplasm: Secondary | ICD-10-CM

## 2015-11-03 MED ORDER — TRAMADOL HCL 50 MG PO TABS: 50.0000 mg | ORAL_TABLET | Freq: Four times a day (QID) | ORAL | Status: DC | PRN

## 2015-11-03 MED ORDER — RANITIDINE HCL 150 MG PO TABS: 150.0000 mg | ORAL_TABLET | Freq: Every day | ORAL | Status: DC

## 2015-11-03 NOTE — Progress Notes (Signed)
Patient ID: Aung Moley [facility], male   DOB: Nov 17, 1945, 70 y.o.   MRN: [id number]    Subjective:  HPI Date of admission- 10/06/15 Date of discharge- 10/08/15  diagnoses-  CAD  Hypertensive heart disease without CHF HLD Unstable angina Acute renal failure superimposed on stage 3 chronic kidney disease Dilated cardiomyopathy  Cardiac Cath with stent placement   Medication changes- Changed omeprazole to Protonix, increased Lipitor to 40mg  daily and pt will stay on Plavix and ASA for lifetime.   Pt reports that since his hospital stay he has been weak and gives out easily. He has had stomach pain in the center of his stomach along with green colored diarrhea. He has no further chest pain or worsening shortness of breath. He gets lightheaded sometimes when he stands up. Pt has a order for fasting lipids and liver panels since starting the increased dose of Lipitor from his cardiologist.   Prior to Admission medications   Medication Sig Start Date End Date Taking? Authorizing Provider  acetaminophen (TYLENOL) 325 MG tablet Take 650 mg by mouth every 6 (six) hours as needed for mild pain.    Yes Historical Provider, MD  allopurinol (ZYLOPRIM) 100 MG tablet Take 1 tablet by mouth at  bedtime 07/21/15  Yes Richard Maceo Pro., MD  amLODipine (NORVASC) 5 MG tablet 1 TABLET, ORAL, DAILY 01/01/15  Yes [name]., MD  aspirin 81 MG tablet Take 81 mg by mouth every morning.    Yes Historical Provider, MD  atorvastatin (LIPITOR) 40 MG tablet Take 1 tablet (40 mg total) by mouth daily at 6 PM. 10/08/15  Yes Arbutus Leas, NP  carvedilol (COREG) 25 MG tablet Take 0.5 tablets (12.5 mg total) by mouth 2 (two) times daily with a meal. 10/17/15  Yes Liliane Shi, PA-C  clopidogrel (PLAVIX) 75 MG tablet Take 1 tablet (75 mg total) by mouth daily. 03/20/15  Yes Burnell Blanks, MD  magnesium oxide (MAG-OX) 400 (241.3 MG) MG tablet TAKE 1 TABLET BY MOUTH TWO TIMES DAILY. 05/27/15  Yes Richard Maceo Pro., MD  Multiple Vitamin (MULTIVITAMIN) tablet Take 1 tablet by mouth every morning.    Yes Historical Provider, MD  nitroGLYCERIN (NITROSTAT) 0.4 MG SL tablet Place 1 tablet (0.4 mg total) under the tongue every 5 (five) minutes as needed. May repeat up to three times. 10/22/14  Yes Burnell Blanks, MD  pantoprazole (PROTONIX) 40 MG tablet Take 1 tablet (40 mg total) by mouth daily. 10/08/15  Yes Arbutus Leas, NP  sodium bicarbonate 650 MG tablet Take 650 mg by mouth 2 (two) times daily.   Yes Historical Provider, MD    Patient Active Problem List   Diagnosis Date Noted  . Acute renal failure superimposed on stage 3 chronic kidney disease ([place]) 10/07/2015  . Dilated cardiomyopathy ([place]) 10/07/2015  . Coronary artery disease involving native coronary artery of native heart with unstable angina pectoris ([place])   . Allergic rhinitis 08/19/2015  . NSTEMI (non-ST elevated myocardial infarction) ([place]) 12/13/2014  . Absolute anemia 10/04/2014  . AA (aortic aneurysm) ([place]) 10/04/2014  . Benign enlargement of prostate 10/04/2014  . Arteriosclerosis of coronary artery 10/04/2014  . Diabetes mellitus, type 2 ([place]) 10/04/2014  . Acid reflux 10/04/2014  . 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 HLD (hyperlipidemia) 10/04/2014  . BP (high blood pressure) 10/04/2014  . Osteoarthrosis 10/04/2014  . Adult BMI 30+ 10/04/2014  . 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 Hypertensive heart disease without CHF  12/30/2012  . Chest pain, atypical 12/30/2012  . Urge incontinence of urine 10/24/2012  . 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 CKD (chronic kidney disease) stage 3, GFR 30-59 ml/min   . Abdominal pain 11/09/2010  . Shortness of breath 11/12/2009  . Renal artery stenosis    . Mixed hyperlipidemia 09/04/2008  . History of redo bypass grafting 09/04/2008  . Diabetes mellitus with nephropathy [facility])     Past Medical History  Diagnosis Date  . CAD (coronary artery  disease) 2007    a. s/p CABG- IMA-LAD, VG-Cx, VG-RCA, VG-diag in 1999. B. sp redo CABG- VG-OM, VG-RCA in 2007 due to VG disease. c. NSTEMI 11/2014 s/p DES to SVG-OM from the Y graft.d. PTCA/DES x 1 distal body of SVG to Diagonal.09/2015  . Ischemic cardiomyopathy 2006    EF 40% to 50% by 2D echo in 2006;  Echo 12/31/12: Mild LVH, EF 50-55%, normal wall motion.   . CKD (chronic kidney disease), stage IV (HCC)     Creatinine 1.9 to 2.2  . Hyperlipidemia   . Hypertension   . Renal artery stenosis ([place])   . Emphysema   . COPD (chronic obstructive pulmonary disease) ([place])   . GERD (gastroesophageal reflux disease)   . Esophageal stricture 07/02/1998    EGD  . Dysrhythmia     bigeminy pvcs  . CHF (congestive heart failure) ([place])   . History of hiatal hernia   . Walking pneumonia 1990's  . Type II diabetes mellitus (HCC)     Diet control   . History of gout     "last flareup was in 2007" (12/13/2014)  . Chronic lower back pain   . Arthritis     "hips; back" (12/13/2014)  . Degenerative disc disease, lumbar   . Deafness in left ear   . Acute renal failure superimposed on stage 3 chronic kidney disease ([place]) 10/07/2015  . Dilated cardiomyopathy ([place]) 10/07/2015    Social History   Social History  . Marital Status: Married    Spouse Name: N/A  . Number of Children: 2  . Years of Education: N/A   Occupational History  . Retired    Social History Main Topics  . Smoking status: Former Smoker -- 3.00 packs/day for 20 years    Types: Cigarettes    Quit date: 07/25/1986  . Smokeless tobacco: Never Used  . Alcohol Use: No  . Drug Use: No  . Sexual Activity: Not Currently   Other Topics Concern  . 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    Allergies  Allergen Reactions  . Ciprofloxacin     GI upset  . Hydrochlorothiazide Other (See Comments)    dehydrates Dehydration  . Hydrocodone     Stomach upset Stomach upset  . Hydrocodone-Acetaminophen     Stomach  upset  . Hydrocodone-Acetaminophen Nausea Only  . Sulfa Antibiotics   . Sulfacetamide Sodium   . Penicillins Hives and Rash    Has patient had a PCN reaction causing immediate rash, facial/tongue/throat swelling, SOB or lightheadedness with hypotension: YES Has patient had a PCN reaction causing severe rash involving mucus membranes or skin necrosis: NO Has patient had a PCN reaction that required hospitalization NO Has patient had a PCN reaction occurring within the last 10 years:NO If all of the above answers are "NO", then may proceed with Cephalosporin use.    Review of Systems  HENT: Negative.   Eyes: Negative.  Respiratory: Negative.   Cardiovascular: Negative.   Gastrointestinal: Positive for abdominal pain and diarrhea.  Genitourinary: Negative.   Skin: Negative.   Neurological: Positive for dizziness and weakness.  Endo/Heme/Allergies: Negative.   Psychiatric/Behavioral: Negative.     Immunization History  Administered Date(s) Administered  . Influenza, High Dose Seasonal PF 03/18/2015  . Pneumococcal Conjugate-13 12/25/2013  . Pneumococcal Polysaccharide-23 03/04/2009  . Td 10/10/2003   Objective:  BP 110/60 mmHg  Pulse 62  Temp(Src) 97.6 F (36.4 C) (Oral)  Resp 16  Wt 228 lb (103.42 kg)  SpO2 96%  Physical Exam  Constitutional: He is oriented to person, place, and time and well-developed, well-nourished, and in no distress.  Eyes: Conjunctivae and EOM are normal. Pupils are equal, round, and reactive to light.  Neck: Normal range of motion. Neck supple.  Cardiovascular: Normal rate, regular rhythm, normal heart sounds and intact distal pulses.   Pulmonary/Chest: Effort normal and breath sounds normal.  Abdominal: Soft. Bowel sounds are normal. There is tenderness (left upper quadrant ).  Musculoskeletal: Normal range of motion.  Neurological: He is alert and oriented to person, place, and time. He has normal reflexes. Gait normal. GCS score is 15.  Skin:  Skin is warm and dry.  Psychiatric: Mood, memory, affect and judgment normal.    Lab Results  Component Value Date   WBC 9.1 10/08/2015   HGB 13.3 10/08/2015   HCT 40.7 10/08/2015   PLT 160 10/08/2015   GLUCOSE 83 10/20/2015   CHOL 135 12/13/2014   TRIG 124 12/13/2014   HDL 30* 12/13/2014   LDLCALC 80 12/13/2014   TSH 2.212 10/06/2015   PSA 0.8 09/13/2013   INR 0.99 10/06/2015   HGBA1C 6.2 09/26/2015    CMP     Component Value Date/Time   NA 140 10/20/2015 1418   NA 142 09/26/2015   K 5.3 10/20/2015 1418   CL 106 10/20/2015 1418   CO2 24 10/20/2015 1418   GLUCOSE 83 10/20/2015 1418   GLUCOSE 77 12/03/2014 1611   GLUCOSE 82 06/13/2006 1621   BUN 36* 10/20/2015 1418   BUN 28* 09/26/2015   CREATININE 2.17* 10/20/2015 1418   CREATININE 1.77* 10/08/2015 0808   CREATININE 1.8* 09/26/2015   CALCIUM 9.7 10/20/2015 1418   PROT 6.5 10/06/2015 1441   PROT 6.5 12/03/2014 1611   ALBUMIN 3.8 10/06/2015 1441   ALBUMIN 4.6 12/03/2014 1611   AST 19 10/06/2015 1441   ALT 16* 10/06/2015 1441   ALKPHOS 151* 10/06/2015 1441   BILITOT 1.1 10/06/2015 1441   BILITOT 1.0 12/03/2014 1611   GFRNONAA 37* 10/08/2015 0808   GFRAA 43* 10/08/2015 0808    Assessment and Plan :  1. Arteriosclerosis of coronary artery  - Amb Referral to Cardiac Rehabilitation  2. Mixed hyperlipidemia  - Comprehensive metabolic panel - Lipid Panel With LDL/HDL Ratio  3. Hospital discharge follow-up   4. Myalgia  - CK  5. Left upper quadrant pain  - Lipase - ranitidine (ZANTAC) 150 MG tablet; Take 1 tablet (150 mg total) by mouth at bedtime.  Dispense: 30 tablet; Refill: 12  6. NSTEMI (non-ST elevated myocardial infarction) ([place])  - Amb Referral to Cardiac Rehabilitation   7. Low back pain without sciatica, unspecified back pain laterality  - traMADol (ULTRAM) 50 MG tablet; Take 1 tablet (50 mg total) by mouth every 6 (six) hours as needed.  Dispense: 100 tablet; Refill: 0   Patient was  seen and examined by Dr. Miguel Aschoff, and noted scribed  by Webb Laws, [place] MD [facility] 11/03/2015 4:13 PM

## 2015-11-04 ENCOUNTER — Encounter: Payer: Self-pay | Admitting: Family Medicine

## 2015-11-04 DIAGNOSIS — R1012 Left upper quadrant pain: Secondary | ICD-10-CM | POA: Diagnosis not present

## 2015-11-04 DIAGNOSIS — M791 Myalgia: Secondary | ICD-10-CM | POA: Diagnosis not present

## 2015-11-04 DIAGNOSIS — E782 Mixed hyperlipidemia: Secondary | ICD-10-CM | POA: Diagnosis not present

## 2015-11-05 LAB — COMPREHENSIVE METABOLIC PANEL
A/G RATIO: 2 (ref 1.2–2.2)
ALT: 13 IU/L (ref 0–44)
AST: 13 IU/L (ref 0–40)
Albumin: 4.3 g/dL (ref 3.5–4.8)
Alkaline Phosphatase: 167 IU/L — ABNORMAL HIGH (ref 39–117)
BUN/Creatinine Ratio: 14 (ref 10–24)
BUN: 36 mg/dL — ABNORMAL HIGH (ref 8–27)
Bilirubin Total: 0.8 mg/dL (ref 0.0–1.2)
CALCIUM: 9.4 mg/dL (ref 8.6–10.2)
CO2: 22 mmol/L (ref 18–29)
Chloride: 107 mmol/L — ABNORMAL HIGH (ref 96–106)
Creatinine, Ser: 2.49 mg/dL — ABNORMAL HIGH (ref 0.76–1.27)
GFR, EST AFRICAN AMERICAN: 29 mL/min/{1.73_m2} — AB (ref >59)
GFR, EST NON AFRICAN AMERICAN: 25 mL/min/{1.73_m2} — AB (ref >59)
GLOBULIN, TOTAL: 2.1 g/dL (ref 1.5–4.5)
Glucose: 127 mg/dL — ABNORMAL HIGH (ref 65–99)
POTASSIUM: 5.5 mmol/L — AB (ref 3.5–5.2)
Sodium: 145 mmol/L — ABNORMAL HIGH (ref 134–144)
TOTAL PROTEIN: 6.4 g/dL (ref 6.0–8.5)

## 2015-11-05 LAB — LIPID PANEL WITH LDL/HDL RATIO
Cholesterol, Total: 112 mg/dL (ref 100–199)
HDL: 32 mg/dL — AB (ref >39)
LDL CALC: 58 mg/dL (ref 0–99)
LDL/HDL RATIO: 1.8 ratio (ref 0.0–3.6)
Triglycerides: 112 mg/dL (ref 0–149)
VLDL Cholesterol Cal: 22 mg/dL (ref 5–40)

## 2015-11-05 LAB — LIPASE: Lipase: 45 U/L (ref 0–59)

## 2015-11-05 LAB — CK: CK TOTAL: 27 U/L (ref 24–204)

## 2015-11-06 NOTE — Progress Notes (Signed)
Advised  ED 

## 2015-11-24 ENCOUNTER — Other Ambulatory Visit (INDEPENDENT_AMBULATORY_CARE_PROVIDER_SITE_OTHER): Payer: Medicare Other | Admitting: *Deleted

## 2015-11-24 DIAGNOSIS — N183 Chronic kidney disease, stage 3 unspecified: Secondary | ICD-10-CM

## 2015-11-24 NOTE — Addendum Note (Signed)
Addended by: Eulis Foster on: 11/24/2015 03:46 PM   Modules accepted: Orders

## 2015-11-25 LAB — BASIC METABOLIC PANEL
BUN: 29 mg/dL — ABNORMAL HIGH (ref 7–25)
CALCIUM: 9.1 mg/dL (ref 8.6–10.3)
CO2: 24 mmol/L (ref 20–31)
CREATININE: 2.44 mg/dL — AB (ref 0.70–1.18)
Chloride: 108 mmol/L (ref 98–110)
GLUCOSE: 104 mg/dL — AB (ref 65–99)
Potassium: 5.6 mmol/L — ABNORMAL HIGH (ref 3.5–5.3)
Sodium: 142 mmol/L (ref 135–146)

## 2015-11-26 ENCOUNTER — Other Ambulatory Visit: Payer: Self-pay | Admitting: Family Medicine

## 2015-11-27 ENCOUNTER — Other Ambulatory Visit: Payer: Self-pay

## 2015-11-27 DIAGNOSIS — R1012 Left upper quadrant pain: Secondary | ICD-10-CM

## 2015-11-27 MED ORDER — RANITIDINE HCL 150 MG PO TABS: 150.0000 mg | ORAL_TABLET | Freq: Every day | ORAL | Status: DC

## 2015-12-01 ENCOUNTER — Encounter: Payer: Self-pay | Admitting: Family Medicine

## 2015-12-01 ENCOUNTER — Ambulatory Visit (INDEPENDENT_AMBULATORY_CARE_PROVIDER_SITE_OTHER): Payer: Medicare Other | Admitting: Family Medicine

## 2015-12-01 VITALS — BP 124/62 | HR 64 | Temp 97.9°F | Resp 16 | Wt 225.0 lb

## 2015-12-01 DIAGNOSIS — E785 Hyperlipidemia, unspecified: Secondary | ICD-10-CM | POA: Diagnosis not present

## 2015-12-01 DIAGNOSIS — IMO0002 Reserved for concepts with insufficient information to code with codable children: Secondary | ICD-10-CM

## 2015-12-01 DIAGNOSIS — E668 Other obesity: Secondary | ICD-10-CM

## 2015-12-01 DIAGNOSIS — I150 Renovascular hypertension: Secondary | ICD-10-CM

## 2015-12-01 DIAGNOSIS — I2510 Atherosclerotic heart disease of native coronary artery without angina pectoris: Secondary | ICD-10-CM

## 2015-12-01 DIAGNOSIS — N183 Chronic kidney disease, stage 3 unspecified: Secondary | ICD-10-CM

## 2015-12-01 DIAGNOSIS — E1121 Type 2 diabetes mellitus with diabetic nephropathy: Secondary | ICD-10-CM | POA: Diagnosis not present

## 2015-12-01 NOTE — Progress Notes (Signed)
Patient ID: [name], male   DOB: January 27, 1946, 70 y.o.   MRN: [id number]    Subjective:  HPI Pt is here for a 4 week follow up. He was not feeling well after his heart stent placement. Today he reports that he is feeling better and is slowly getting his strength back. He has been cutting his food intake back and has lost another 3 pounds. He does mention that he has an appt with a nephrologist for his worseing kidney function.   Pt feels better and he is working successfully on  his eating habits with a total of 9lb weight loss over past few months. Prior to Admission medications   Medication Sig Start Date End Date Taking? Authorizing Provider  acetaminophen (TYLENOL) 325 MG tablet Take 650 mg by mouth every 6 (six) hours as needed for mild pain.    Yes Historical Provider, Ronald [name]  allopurinol (ZYLOPRIM) 100 MG tablet Take 1 tablet by mouth at  bedtime 07/21/15  Yes Ronald Angevine Maceo Pro., Ronald [name]  amLODipine [facility]) 5 MG tablet Take 1 tablet by mouth  daily 11/27/15  Yes Ronald Banana., Ronald [name]  aspirin 81 MG tablet Take 81 mg by mouth every morning.    Yes Historical Provider, Ronald [name]  atorvastatin (LIPITOR) 40 MG tablet Take 1 tablet (40 mg total) by mouth daily at 6 PM. 10/08/15  Yes Ronald Leas, Ronald [name]  carvedilol (COREG) 25 MG tablet Take 0.5 tablets (12.5 mg total) by mouth 2 (two) times daily with a meal. 10/17/15  Yes Ronald [name], Ronald [name]  clopidogrel (PLAVIX) 75 MG tablet Take 1 tablet (75 mg total) by mouth daily. 03/20/15  Yes Ronald Blanks, Ronald [name]  magnesium oxide (MAG-OX) 400 (241.3 MG) MG tablet TAKE 1 TABLET BY MOUTH TWO TIMES DAILY. 05/27/15  Yes Ronald Flannery Maceo Pro., Ronald [name]  Multiple Vitamin (MULTIVITAMIN) tablet Take 1 tablet by mouth every morning.    Yes Historical Provider, Ronald [name]  nitroGLYCERIN (NITROSTAT) 0.4 MG SL tablet Place 1 tablet (0.4 mg total) under the tongue every 5 (five) minutes as needed. May repeat up to three times. 10/22/14  Yes Ronald Blanks, Ronald [name]  pantoprazole  (PROTONIX) 40 MG tablet Take 1 tablet (40 mg total) by mouth daily. 10/08/15  Yes Ronald Leas, Ronald [name]  ranitidine (ZANTAC) 150 MG tablet Take 1 tablet (150 mg total) by mouth at bedtime. 11/27/15  Yes Ronald Grand Maceo Pro., Ronald [name]  sodium bicarbonate 650 MG tablet Take 650 mg by mouth 2 (two) times daily.   Yes Historical Provider, Ronald [name]  traMADol (ULTRAM) 50 MG tablet Take 1 tablet (50 mg total) by mouth every 6 (six) hours as needed. Patient not taking: Reported on 12/01/2015 11/03/15   Ronald Banana., Ronald [name]    Patient Active Problem List   Diagnosis Date Noted  . Acute renal failure superimposed on stage 3 chronic kidney disease ([place]) 10/07/2015  . Dilated cardiomyopathy ([place]) 10/07/2015  . Coronary artery disease involving native coronary artery of native heart with unstable angina pectoris ([place])   . Allergic rhinitis 08/19/2015  . NSTEMI (non-ST elevated myocardial infarction) ([place]) 12/13/2014  . Absolute anemia 10/04/2014  . AA (aortic aneurysm) ([place]) 10/04/2014  . Benign enlargement of prostate 10/04/2014  . Arteriosclerosis of coronary artery 10/04/2014  . Diabetes mellitus, type 2 ([place]) 10/04/2014  . Acid reflux 10/04/2014  . 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 HLD (hyperlipidemia) 10/04/2014  . BP (high blood pressure) 10/04/2014  . Osteoarthrosis 10/04/2014  . Adult BMI  30+ 10/04/2014  . 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 Chest pain, atypical 12/30/2012  . Urge incontinence of urine 10/24/2012  . 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 CKD (chronic kidney disease) stage 3, GFR 30-59 ml/min   . Abdominal pain 11/09/2010  . Shortness of breath 11/12/2009  . Renal artery stenosis    . Mixed hyperlipidemia 09/04/2008  . History of redo bypass grafting 09/04/2008  . Diabetes mellitus with nephropathy [facility])     Past Medical History  Diagnosis Date  . CAD (coronary artery disease) 2007    a. s/p CABG-  IMA-LAD, VG-Cx, VG-RCA, VG-diag in 1999. B. sp redo CABG- VG-OM, VG-RCA in 2007 due to VG disease. c. NSTEMI 11/2014 s/p DES to SVG-OM from the Y graft.d. PTCA/DES x 1 distal body of SVG to Diagonal.09/2015  . Ischemic cardiomyopathy 2006    EF 40% to 50% by 2D echo in 2006;  Echo 12/31/12: Mild LVH, EF 50-55%, normal wall motion.   . CKD (chronic kidney disease), stage IV (HCC)     Creatinine 1.9 to 2.2  . Hyperlipidemia   . Hypertension   . Renal artery stenosis ([place])   . Emphysema   . COPD (chronic obstructive pulmonary disease) ([place])   . GERD (gastroesophageal reflux disease)   . Esophageal stricture 07/02/1998    EGD  . Dysrhythmia     bigeminy pvcs  . CHF (congestive heart failure) ([place])   . History of hiatal hernia   . Walking pneumonia 1990's  . Type II diabetes mellitus (HCC)     Diet control   . History of gout     "last flareup was in 2007" (12/13/2014)  . Chronic lower back pain   . Arthritis     "hips; back" (12/13/2014)  . Degenerative disc disease, lumbar   . Deafness in left ear   . Acute renal failure superimposed on stage 3 chronic kidney disease ([place]) 10/07/2015  . Dilated cardiomyopathy ([place]) 10/07/2015    Social History   Social History  . Marital Status: Married    Spouse Name: N/A  . Number of Children: 2  . Years of Education: N/A   Occupational History  . Retired    Social History Main Topics  . Smoking status: Former Smoker -- 3.00 packs/day for 20 years    Types: Cigarettes    Quit date: 07/25/1986  . Smokeless tobacco: Never Used  . Alcohol Use: No  . Drug Use: No  . Sexual Activity: Not Currently   Other Topics Concern  . 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    Allergies  Allergen Reactions  . Ciprofloxacin     GI upset  . Hydrochlorothiazide Other (See Comments)    dehydrates Dehydration  . Hydrocodone     Stomach upset Stomach upset  . Hydrocodone-Acetaminophen     Stomach upset  .  Hydrocodone-Acetaminophen Nausea Only  . Sulfa Antibiotics   . Sulfacetamide Sodium   . Penicillins Hives and Rash    Has patient had a PCN reaction causing immediate rash, facial/tongue/throat swelling, SOB or lightheadedness with hypotension: YES Has patient had a PCN reaction causing severe rash involving mucus membranes or skin necrosis: NO Has patient had a PCN reaction that required hospitalization NO Has patient had a PCN reaction occurring within the last 10 years:NO If all of the above answers are "NO", then may proceed with Cephalosporin  use.    Review of Systems  Constitutional: Negative.   HENT: Negative.   Eyes: Negative.   Respiratory: Negative.   Cardiovascular: Negative.   Gastrointestinal: Negative.   Genitourinary: Negative.   Musculoskeletal: Negative.   Skin: Negative.   Neurological: Negative.   Endo/Heme/Allergies: Negative.   Psychiatric/Behavioral: Negative.     Immunization History  Administered Date(s) Administered  . Influenza, High Dose Seasonal PF 03/18/2015  . Pneumococcal Conjugate-13 12/25/2013  . Pneumococcal Polysaccharide-23 03/04/2009  . Td 10/10/2003   Objective:  BP 124/62 mmHg  Pulse 64  Temp(Src) 97.9 F (36.6 C) (Oral)  Resp 16  Wt 225 lb (102.059 kg)  Physical Exam  Constitutional: He is oriented to person, place, and time and well-developed, well-nourished, and in no distress.  Eyes: Conjunctivae and EOM are normal. Pupils are equal, round, and reactive to light.  Neck: Normal range of motion. Neck supple.  Cardiovascular: Normal rate, regular rhythm, normal heart sounds and intact distal pulses.   Pulmonary/Chest: Effort normal and breath sounds normal.  Abdominal: Soft. Bowel sounds are normal.  Musculoskeletal: Normal range of motion. He exhibits edema (1+).  Neurological: He is alert and oriented to person, place, and time. He has normal reflexes. Gait normal. GCS score is 15.  Skin: Skin is warm and dry.  Psychiatric:  Mood, memory, affect and judgment normal.    Lab Results  Component Value Date   WBC 9.1 10/08/2015   HGB 13.3 10/08/2015   HCT 40.7 10/08/2015   PLT 160 10/08/2015   GLUCOSE 104* 11/24/2015   CHOL 112 11/04/2015   TRIG 112 11/04/2015   HDL 32* 11/04/2015   LDLCALC 58 11/04/2015   TSH 2.212 10/06/2015   PSA 0.8 09/13/2013   INR 0.99 10/06/2015   HGBA1C 6.2 09/26/2015    CMP     Component Value Date/Time   NA 142 11/24/2015 1546   NA 145* 11/04/2015 0938   K 5.6* 11/24/2015 1546   CL 108 11/24/2015 1546   CO2 24 11/24/2015 1546   GLUCOSE 104* 11/24/2015 1546   GLUCOSE 127* 11/04/2015 0938   GLUCOSE 82 06/13/2006 1621   BUN 29* 11/24/2015 1546   BUN 36* 11/04/2015 0938   CREATININE 2.44* 11/24/2015 1546   CREATININE 2.49* 11/04/2015 0938   CREATININE 1.8* 09/26/2015   CALCIUM 9.1 11/24/2015 1546   PROT 6.4 11/04/2015 0938   PROT 6.5 10/06/2015 1441   ALBUMIN 4.3 11/04/2015 0938   ALBUMIN 3.8 10/06/2015 1441   AST 13 11/04/2015 0938   ALT 13 11/04/2015 0938   ALKPHOS 167* 11/04/2015 0938   BILITOT 0.8 11/04/2015 0938   BILITOT 1.1 10/06/2015 1441   GFRNONAA 25* 11/04/2015 0938   GFRAA 29* 11/04/2015 0938    Assessment and Plan :  1. Arteriosclerosis of coronary artery Pt is slowing getting his strength back from stent placement.Presently asymptomatic for CAD symptoms. All risk factors are being aggressively treated.  2. Renovascular hypertension   3. CKD (chronic kidney disease) stage 3, GFR 30-59 ml/min   4. HLD (hyperlipidemia)   5. Adult BMI 30+ Discussed diet and exercise. Has lost 10 pounds.  6. Type 2 diabetes mellitus with diabetic nephropathy, unspecified long term insulin use status ([place]) With recent weight loss I think his borderline diabetes were probably correct to normal for a few years. He has significant truncal obesity. Almost all of his excess weight is in his abdomen.RTC 2-3 months for A1C.  Patient was seen and examined by Dr.  Miguel Aschoff, and  noted scribed by Webb Laws, [place] [name] 12/01/2015 4:10 PM

## 2015-12-16 ENCOUNTER — Ambulatory Visit: Payer: Medicare Other | Admitting: Family Medicine

## 2015-12-29 ENCOUNTER — Other Ambulatory Visit: Payer: Self-pay | Admitting: Cardiovascular Disease

## 2015-12-29 DIAGNOSIS — I25700 Atherosclerosis of coronary artery bypass graft(s), unspecified, with unstable angina pectoris: Secondary | ICD-10-CM

## 2016-01-01 ENCOUNTER — Other Ambulatory Visit: Payer: Self-pay | Admitting: *Deleted

## 2016-01-01 MED ORDER — ATORVASTATIN CALCIUM 40 MG PO TABS: 40.0000 mg | ORAL_TABLET | Freq: Every day | ORAL | 3 refills | Status: DC

## 2016-01-08 DIAGNOSIS — E872 Acidosis: Secondary | ICD-10-CM | POA: Diagnosis not present

## 2016-01-08 DIAGNOSIS — N183 Chronic kidney disease, stage 3 (moderate): Secondary | ICD-10-CM | POA: Diagnosis not present

## 2016-01-28 ENCOUNTER — Encounter: Payer: Self-pay | Admitting: Cardiovascular Disease

## 2016-01-28 ENCOUNTER — Ambulatory Visit (INDEPENDENT_AMBULATORY_CARE_PROVIDER_SITE_OTHER): Payer: Medicare Other | Admitting: Cardiovascular Disease

## 2016-01-28 ENCOUNTER — Encounter (INDEPENDENT_AMBULATORY_CARE_PROVIDER_SITE_OTHER): Payer: Self-pay

## 2016-01-28 VITALS — BP 120/70 | HR 63 | Ht 70.0 in | Wt 222.2 lb

## 2016-01-28 DIAGNOSIS — I10 Essential (primary) hypertension: Secondary | ICD-10-CM

## 2016-01-28 DIAGNOSIS — I2510 Atherosclerotic heart disease of native coronary artery without angina pectoris: Secondary | ICD-10-CM | POA: Diagnosis not present

## 2016-01-28 DIAGNOSIS — N183 Chronic kidney disease, stage 3 unspecified: Secondary | ICD-10-CM

## 2016-01-28 DIAGNOSIS — E785 Hyperlipidemia, unspecified: Secondary | ICD-10-CM | POA: Diagnosis not present

## 2016-01-28 DIAGNOSIS — I255 Ischemic cardiomyopathy: Secondary | ICD-10-CM

## 2016-01-28 NOTE — Progress Notes (Signed)
Chief Complaint  Patient presents with  . Coronary Artery Disease    History of Present Illness: 70 y.o. male with history of CAD s/p CABG in 1996 (LIMA-LAD, SVG-circumflex, SVG-RCA, SVG-diagonal) with redo bypass in 2007 (SVG-OM, SVG-RCA), ischemic cardiomyopathy, CKD, DM 2, HTN, HLD, renal artery stenosis, COPD, GERD here today for cardiac follow up. He has been followed by Dr. Lia Foyer. Lexiscan Myoview 12/31/12: No ischemia or scar, EF 53%. Echo 12/31/12: Mild LVH, EF 50-55%, normal wall motion. He was hospitalized at [facility] July 2016 with a NSTEMI. Cardiac cath with severe disease in the SVG to OM. This was treated with a 3.5 x 12 mm Synergy DES. He was treated with Brilinta post PCI but developed dyspnea and was changed to Plavix. He was admitted to [facility] May 2017 with unstable angina. Cardiac cath May 2017 with severe disease in the distal body/anastomosis of the SVG to Diagonal. A DES was placed in this vein graft. The LIMA to LAD was patent, SVG to OM was patent, SVG to PDA/PLA was patent.   He is here today for follow up. He is doing well. No chest pain or SOB. He denies orthopnea, PND or edema. No edema.   Primary Care Physician: [name], MD   Past Medical History:  Diagnosis Date  . Acute renal failure superimposed on stage 3 chronic kidney disease ([place]) 10/07/2015  . Arthritis    "hips; back" (12/13/2014)  . CAD (coronary artery disease) 2007   a. s/p CABG- IMA-LAD, VG-Cx, VG-RCA, VG-diag in 1999. B. sp redo CABG- VG-OM, VG-RCA in 2007 due to VG disease. c. NSTEMI 11/2014 s/p DES to SVG-OM from the Y graft.d. PTCA/DES x 1 distal body of SVG to Diagonal.09/2015  . CHF (congestive heart failure) ([place])   . Chronic lower back pain   . CKD (chronic kidney disease), stage IV (HCC)    Creatinine 1.9 to 2.2  . COPD (chronic obstructive pulmonary disease) ([place])   . Deafness in left ear   . Degenerative disc disease, lumbar   . Dilated cardiomyopathy ([place]) 10/07/2015  . Dysrhythmia    bigeminy pvcs  . Emphysema   . Esophageal stricture 07/02/1998   EGD  . GERD (gastroesophageal reflux disease)   . History of gout    "last flareup was in 2007" (12/13/2014)  . History of hiatal hernia   . Hyperlipidemia   . Hypertension   . Ischemic cardiomyopathy 2006   EF 40% to 50% by 2D echo in 2006;  Echo 12/31/12: Mild LVH, EF 50-55%, normal wall motion.   . Renal artery stenosis ([place])   . Type II diabetes mellitus (HCC)    Diet control   . Walking pneumonia 1990's    Past Surgical History:  Procedure Laterality Date  . CARDIAC CATHETERIZATION  "several"  . CARDIAC CATHETERIZATION N/A 12/13/2014   Procedure: Left Heart Cath and Coronary Angiography;  Surgeon: Jettie Booze, MD;  Location: [place] [facility];  Service: Cardiovascular;  Laterality: N/A;  . CARDIAC CATHETERIZATION  12/13/2014   Procedure: Coronary Stent Intervention;  Surgeon: Jettie Booze, MD;  Location: [place] [facility];  Service: Cardiovascular;;  . CARDIAC CATHETERIZATION N/A 10/07/2015   Procedure: Left Heart Cath and Cors/Grafts Angiography;  Surgeon: Burnell Blanks, MD;  Location: [place] [facility];  Service: Cardiovascular;  Laterality: N/A;  . CARDIAC CATHETERIZATION N/A 10/07/2015   Procedure: Coronary Stent Intervention;  Surgeon: Burnell Blanks, MD;  Location: [place] [facility];  Service: Cardiovascular;  Laterality: N/A;  . CORONARY ANGIOPLASTY  "several"  . CORONARY ANGIOPLASTY WITH STENT PLACEMENT  2005; 12/13/2014   "2; 1"  . CORONARY ARTERY BYPASS GRAFT  1996   CABG X5  . CORONARY ARTERY BYPASS GRAFT  March 2007   CABG X3  . ESOPHAGOGASTRODUODENOSCOPY (EGD) WITH ESOPHAGEAL DILATION  2000  . GREEN LIGHT LASER TURP (TRANSURETHRAL RESECTION OF PROSTATE  2000's   "not cancerous"  . HERNIA REPAIR    . LAPAROSCOPIC CHOLECYSTECTOMY    . LUNG SURGERY  1996   "S/P CABG, had to put staple in lung after it had collapsed"  . UMBILICAL HERNIA REPAIR     w/chole    Current  Outpatient Prescriptions  Medication Sig Dispense Refill  . acetaminophen (TYLENOL) 650 MG CR tablet Take 1,300 mg by mouth daily as needed for pain.    [name] allopurinol (ZYLOPRIM) 100 MG tablet Take 1 tablet by mouth at  bedtime 90 tablet 3  . amLODipine (NORVASC) 5 MG tablet Take 1 tablet by mouth  daily 90 tablet 3  . aspirin 81 MG tablet Take 81 mg by mouth every morning.     [name] atorvastatin (LIPITOR) 40 MG tablet Take 1 tablet (40 mg total) by mouth daily. 90 tablet 3  . carvedilol (COREG) 12.5 MG tablet Take 12.5 mg by mouth 2 (two) times daily with a meal.    . clopidogrel (PLAVIX) 75 MG tablet Take 1 tablet by mouth  daily 90 tablet 3  . magnesium oxide (MAG-OX) 400 (241.3 MG) MG tablet TAKE 1 TABLET BY MOUTH TWO TIMES DAILY. 180 tablet 4  . Multiple Vitamin (MULTIVITAMIN) tablet Take 1 tablet by mouth every morning.     . nitroGLYCERIN (NITROSTAT) 0.4 MG SL tablet Place 1 tablet (0.4 mg total) under the tongue every 5 (five) minutes as needed. May repeat up to three times. 25 tablet 6  . pantoprazole (PROTONIX) 40 MG tablet Take 1 tablet (40 mg total) by mouth daily. 30 tablet 12  . ranitidine (ZANTAC) 150 MG tablet Take 1 tablet (150 mg total) by mouth at bedtime. 90 tablet 3  . sodium bicarbonate 650 MG tablet Take 650 mg by mouth 2 (two) times daily.    . traMADol (ULTRAM) 50 MG tablet Take 50 mg by mouth daily as needed for pain.     No current facility-administered medications for this visit.     Allergies  Allergen Reactions  . Ciprofloxacin     GI upset  . Hydrochlorothiazide Other (See Comments)    dehydrates Dehydration  . Hydrocodone     Stomach upset Stomach upset  . Hydrocodone-Acetaminophen     Stomach upset  . Hydrocodone-Acetaminophen Nausea Only  . Sulfa Antibiotics Other (See Comments)    Cant recall  . Sulfacetamide Sodium Other (See Comments)    Cant recall  . Penicillins Hives and Rash    Has patient had a PCN reaction causing immediate rash,  facial/tongue/throat swelling, SOB or lightheadedness with hypotension: YES Has patient had a PCN reaction causing severe rash involving mucus membranes or skin necrosis: NO Has patient had a PCN reaction that required hospitalization NO Has patient had a PCN reaction occurring within the last 10 years:NO If all of the above answers are "NO", then may proceed with Cephalosporin use.    Social History   Social History  . Marital status: Married    Spouse name: N/A  . Number of children: 2  . Years of education: N/A   Occupational History  .  Retired    Social History Main Topics  . Smoking status: Former Smoker    Packs/day: 3.00    Years: 20.00    Types: Cigarettes    Quit date: 07/25/1986  . Smokeless tobacco: Never Used  . Alcohol use No  . Drug use: No  . Sexual activity: Not Currently   Other Topics Concern  . 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    Family History  Problem Relation Age of Onset  . Heart attack Mother     MI  . Stroke Mother   . Heart disease Mother   . Hypertension Mother   . Hyperlipidemia Mother   . Heart disease Father   . Rheumatic fever Father     Review of Systems:  As stated in the HPI and otherwise negative.   BP 120/70   Pulse 63   Ht 5\' 10"  (1.778 m)   Wt 222 lb 3.2 oz (100.8 kg)   BMI 31.88 kg/m   Physical Examination: General: Well developed, well nourished, NAD  HEENT: OP clear, mucus membranes moist  SKIN: warm, dry. No rashes. Neuro: No focal deficits  Musculoskeletal: Muscle strength 5/5 all ext  Psychiatric: Mood and affect normal  Neck: No JVD, no carotid bruits, no thyromegaly, no lymphadenopathy.  Lungs:Clear bilaterally, no wheezes, rhonci, crackles Cardiovascular: Regular rate and rhythm. No murmurs, gallops or rubs. Abdomen:Soft. Bowel sounds present. Non-tender.  Extremities: No lower extremity edema. Pulses are 2 + in the bilateral DP/PT  Cardiac cath May 2017: 1. Severe  triple vessel CAD s/p CABG with 5 patent grafts.  2. LAD occluded proximally. The mid and distal vessel fills from the patent IMA graft. The Diagonal fills from the patent SVG (there is a severe stenosis at this anastomosis).  3. Circumflex is occluded proximally. The vein graft to the OM is patent.  4. RCA is occluded proximally. The vein grafts to the distal RCA branches (PDA and PLA) are patent with diffuse moderate disease in the bodies of the vein grafts.  5. Successful PTCA/DES x 1 distal body of SVG to Diagonal.    EKG:  EKG is not ordered today. The ekg ordered today demonstrates  Recent Labs: 10/06/2015: Magnesium 2.5; TSH 2.212 10/08/2015: Hemoglobin 13.3; Platelets 160 11/04/2015: ALT 13 11/24/2015: BUN 29; Creat 2.44; Potassium 5.6; Sodium 142   Lipid Panel    Component Value Date/Time   CHOL 112 11/04/2015 0938   TRIG 112 11/04/2015 0938   HDL 32 (L) 11/04/2015 0938   CHOLHDL 4.5 12/13/2014 0423   VLDL 25 12/13/2014 0423   LDLCALC 58 11/04/2015 0938     Wt Readings from Last 3 Encounters:  01/28/16 222 lb 3.2 oz (100.8 kg)  12/01/15 225 lb (102.1 kg)  11/03/15 228 lb (103.4 kg)     Other studies Reviewed: Additional studies/ records that were reviewed today include: . Review of the above records demonstrates:    Assessment and Plan:   1. CAD: Recent PCI/DES of the SVG to Diagonal May 2017. He is doing well. No chest pain. Continue current therapy with aspirin, Plavix, statin, beta blocker.   2. Ischemic Cardiomyopathy: Last LVEF=53% by echo 2014. Continue beta blocker. He is not on an Ace-inh due to his renal insufficiency.   3. Hypertension: BP is well controlled. No changes today.    4. Hyperlipidemia: Continue statin. Lipids are followed in primary care.   5. CKD: Followed by Nephrology. Baseline creatinine 2.0-2.5.  Current medicines are reviewed at length with the patient today.  The patient does not have concerns regarding medicines.  The following  changes have been made:  no change  Labs/ tests ordered today include:   No orders of the defined types were placed in this encounter.   Disposition:   FU with me in 6 months  Signed, Lauree Chandler, MD 01/28/2016 4:48 PM    [address], [place], [place]  [address] Phone: [phone]; Fax: [phone]

## 2016-01-28 NOTE — Patient Instructions (Signed)

## 2016-02-03 ENCOUNTER — Other Ambulatory Visit: Payer: Self-pay | Admitting: Family Medicine

## 2016-02-10 DIAGNOSIS — I701 Atherosclerosis of renal artery: Secondary | ICD-10-CM | POA: Diagnosis not present

## 2016-02-10 DIAGNOSIS — N179 Acute kidney failure, unspecified: Secondary | ICD-10-CM | POA: Diagnosis not present

## 2016-02-10 DIAGNOSIS — N183 Chronic kidney disease, stage 3 (moderate): Secondary | ICD-10-CM | POA: Diagnosis not present

## 2016-02-10 DIAGNOSIS — I129 Hypertensive chronic kidney disease with stage 1 through stage 4 chronic kidney disease, or unspecified chronic kidney disease: Secondary | ICD-10-CM | POA: Diagnosis not present

## 2016-02-10 DIAGNOSIS — E1129 Type 2 diabetes mellitus with other diabetic kidney complication: Secondary | ICD-10-CM | POA: Diagnosis not present

## 2016-03-02 ENCOUNTER — Ambulatory Visit (INDEPENDENT_AMBULATORY_CARE_PROVIDER_SITE_OTHER): Payer: Medicare Other | Admitting: Family Medicine

## 2016-03-02 ENCOUNTER — Encounter: Payer: Self-pay | Admitting: Family Medicine

## 2016-03-02 VITALS — BP 142/60 | HR 64 | Temp 98.5°F | Resp 14 | Wt 227.0 lb

## 2016-03-02 DIAGNOSIS — M545 Low back pain: Secondary | ICD-10-CM

## 2016-03-02 DIAGNOSIS — G8929 Other chronic pain: Secondary | ICD-10-CM

## 2016-03-02 DIAGNOSIS — K219 Gastro-esophageal reflux disease without esophagitis: Secondary | ICD-10-CM

## 2016-03-02 DIAGNOSIS — N183 Chronic kidney disease, stage 3 unspecified: Secondary | ICD-10-CM

## 2016-03-02 DIAGNOSIS — Z23 Encounter for immunization: Secondary | ICD-10-CM

## 2016-03-02 DIAGNOSIS — I150 Renovascular hypertension: Secondary | ICD-10-CM | POA: Diagnosis not present

## 2016-03-02 DIAGNOSIS — R7303 Prediabetes: Secondary | ICD-10-CM | POA: Diagnosis not present

## 2016-03-02 LAB — POCT GLYCOSYLATED HEMOGLOBIN (HGB A1C): Hemoglobin A1C: 5.9

## 2016-03-02 NOTE — Progress Notes (Signed)
Ronald [name] [facility]  MRN: [id number] DOB: 1945/12/27  Subjective:  HPI  Patient is here for follow up  Diabetes: patient does not check his sugar at home. He does not have numbness or tingling sensation. Lab Results  Component Value Date   HGBA1C 6.2 09/26/2015   His lower back is still giving him issues and worsening. He takes Acetaminophen arthritis at times and does not take Tramadol anymore.  He has seen Dr Candiss Norse for follow up and Enalapril was re started.  He is having no chest pain, dyspnea on exertion or any anginal symptoms. Patient Active Problem List   Diagnosis Date Noted  . Acute renal failure superimposed on stage 3 chronic kidney disease ([place]) 10/07/2015  . Dilated cardiomyopathy ([place]) 10/07/2015  . Coronary artery disease involving native coronary artery of native heart with unstable angina pectoris ([place])   . Allergic rhinitis 08/19/2015  . NSTEMI (non-ST elevated myocardial infarction) ([place]) 12/13/2014  . Absolute anemia 10/04/2014  . AA (aortic aneurysm) ([place]) 10/04/2014  . Benign enlargement of prostate 10/04/2014  . Arteriosclerosis of coronary artery 10/04/2014  . Diabetes mellitus, type 2 ([place]) 10/04/2014  . Acid reflux 10/04/2014  . 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 HLD (hyperlipidemia) 10/04/2014  . BP (high blood pressure) 10/04/2014  . Osteoarthrosis 10/04/2014  . Adult BMI 30+ 10/04/2014  . 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 Chest pain, atypical 12/30/2012  . Urge incontinence of urine 10/24/2012  . 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 CKD (chronic kidney disease) stage 3, GFR 30-59 ml/min   . Abdominal pain 11/09/2010  . Shortness of breath 11/12/2009  . Renal artery stenosis    . Mixed hyperlipidemia 09/04/2008  . History of redo bypass grafting 09/04/2008  . Diabetes mellitus with nephropathy [facility])     Past Medical History:  Diagnosis Date  . Acute renal  failure superimposed on stage 3 chronic kidney disease ([place]) 10/07/2015  . Arthritis    "hips; back" (12/13/2014)  . CAD (coronary artery disease) 2007   a. s/p CABG- IMA-LAD, VG-Cx, VG-RCA, VG-diag in 1999. B. sp redo CABG- VG-OM, VG-RCA in 2007 due to VG disease. c. NSTEMI 11/2014 s/p DES to SVG-OM from the Y graft.d. PTCA/DES x 1 distal body of SVG to Diagonal.09/2015  . CHF (congestive heart failure) ([place])   . Chronic lower back pain   . CKD (chronic kidney disease), stage IV (HCC)    Creatinine 1.9 to 2.2  . COPD (chronic obstructive pulmonary disease) ([place])   . Deafness in left ear   . Degenerative disc disease, lumbar   . Dilated cardiomyopathy ([place]) 10/07/2015  . Dysrhythmia    bigeminy pvcs  . Emphysema   . Esophageal stricture 07/02/1998   EGD  . GERD (gastroesophageal reflux disease)   . History of gout    "last flareup was in 2007" (12/13/2014)  . History of hiatal hernia   . Hyperlipidemia   . Hypertension   . Ischemic cardiomyopathy 2006   EF 40% to 50% by 2D echo in 2006;  Echo 12/31/12: Mild LVH, EF 50-55%, normal wall motion.   . Renal artery stenosis ([place])   . Type II diabetes mellitus (HCC)    Diet control   . Walking pneumonia 1990's    Social History   Social History  . Marital status: Married    Spouse name: N/A  . Number of children: 2  . Years of education: N/A  Occupational History  . Retired    Social History Main Topics  . Smoking status: Former Smoker    Packs/day: 3.00    Years: 20.00    Types: Cigarettes    Quit date: 07/25/1986  . Smokeless tobacco: Never Used  . Alcohol use No  . Drug use: No  . Sexual activity: Not Currently   Other Topics Concern  . 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    Outpatient Encounter Prescriptions as of 03/02/2016  Medication Sig Note  . acetaminophen (TYLENOL) 650 MG CR tablet Take 1,300 mg by mouth daily as needed for pain.   [name] allopurinol (ZYLOPRIM) 100 MG tablet Take 1 tablet  by mouth at  bedtime   . amLODipine (NORVASC) 5 MG tablet Take 1 tablet by mouth  daily   . aspirin 81 MG tablet Take 81 mg by mouth every morning.    [name] atorvastatin (LIPITOR) 40 MG tablet Take 1 tablet (40 mg total) by mouth daily.   . carvedilol (COREG) 12.5 MG tablet Take 12.5 mg by mouth 2 (two) times daily with a meal.   . clopidogrel (PLAVIX) 75 MG tablet Take 1 tablet by mouth  daily   . enalapril (VASOTEC) 10 MG tablet Take 10 mg by mouth daily.  03/02/2016: Received from: External Pharmacy  . magnesium oxide (MAG-OX) 400 (241.3 Mg) MG tablet TAKE 1 TABLET BY MOUTH TWO TIMES DAILY.   . Multiple Vitamin (MULTIVITAMIN) tablet Take 1 tablet by mouth every morning.    . nitroGLYCERIN (NITROSTAT) 0.4 MG SL tablet Place 1 tablet (0.4 mg total) under the tongue every 5 (five) minutes as needed. May repeat up to three times.   . pantoprazole (PROTONIX) 40 MG tablet Take 1 tablet (40 mg total) by mouth daily.   . ranitidine (ZANTAC) 150 MG tablet Take 1 tablet (150 mg total) by mouth at bedtime.   . sodium bicarbonate 650 MG tablet Take 650 mg by mouth 2 (two) times daily.   . traMADol (ULTRAM) 50 MG tablet Take 50 mg by mouth daily as needed for pain. 01/28/2016: Received from: External Pharmacy   No facility-administered encounter medications on file as of 03/02/2016.     Allergies  Allergen Reactions  . Ciprofloxacin     GI upset  . Hydrochlorothiazide Other (See Comments)    dehydrates Dehydration  . Hydrocodone     Stomach upset Stomach upset  . Hydrocodone-Acetaminophen     Stomach upset  . Hydrocodone-Acetaminophen Nausea Only  . Sulfa Antibiotics Other (See Comments)    Cant recall  . Sulfacetamide Sodium Other (See Comments)    Cant recall  . Penicillins Hives and Rash    Has patient had a PCN reaction causing immediate rash, facial/tongue/throat swelling, SOB or lightheadedness with hypotension: YES Has patient had a PCN reaction causing severe rash involving mucus  membranes or skin necrosis: NO Has patient had a PCN reaction that required hospitalization NO Has patient had a PCN reaction occurring within the last 10 years:NO If all of the above answers are "NO", then may proceed with Cephalosporin use.    Review of Systems  Constitutional: Negative.   Eyes: Negative.   Respiratory: Negative.   Cardiovascular: Negative.   Gastrointestinal: Negative.   Musculoskeletal: Positive for back pain and myalgias.  Skin: Negative.   Neurological: Negative.   Endo/Heme/Allergies: Negative.   Psychiatric/Behavioral: Negative.    Objective:  BP (!) 142/60   Pulse 64  Temp 98.5 F (36.9 C)   Resp 14   Wt 227 lb (103 kg)   BMI 32.57 kg/m   Physical Exam  Constitutional: He is oriented to person, place, and time and well-developed, well-nourished, and in no distress.  HENT:  Head: Normocephalic and atraumatic.  Right Ear: External ear normal.  Left Ear: External ear normal.  Nose: Nose normal.  Eyes: Conjunctivae are normal. Pupils are equal, round, and reactive to light.  Neck: Normal range of motion. Neck supple.  Cardiovascular: Normal rate, regular rhythm, normal heart sounds and intact distal pulses.   No murmur heard. Pulmonary/Chest: Effort normal and breath sounds normal.  Abdominal: Soft.  Musculoskeletal: He exhibits no edema or tenderness.  Neurological: He is alert and oriented to person, place, and time. He displays no weakness and normal reflexes. He exhibits normal muscle tone. Gait normal.  Normal strength and reflex.  Skin: Skin is warm and dry.  Psychiatric: Mood, memory, affect and judgment normal.    Assessment and Plan :  1. Type 2 diabetes mellitus with diabetic nephropathy, unspecified long term insulin use status (HCC) A1C 5.9 today. Better. Continue to follow. - POCT HgB A1C--5.9  2. CKD (chronic kidney disease) stage 3, GFR 30-59 ml/min Follows Dr Candiss Norse.  3. Gastroesophageal reflux disease without  esophagitis Stable. Feels better on Protonix vs Omeprazole.  4. Renovascular hypertension  5. Need for influenza vaccination - Flu vaccine HIGH DOSE PF (Fluzone High dose)  6. Chronic bilateral low back pain without sciatica Discussed options for this issue. I think the best plan would be to go to physical therapy-patient does not want to proceed for now. He will re start taking Tramadol as needed. Advised patient and his wife that we can order MRI but it will not be covered, this is not a surgical problem from what I see today. His pain does not radiate. Neurological exam today is normal, reflexes normal and strength. Due to his kidney levels can not use NSAIDs.  Last LS Xray and Hips xray was done July 2016 no need to repeat at this time. Follow. 7. CAD All risk factors treated HPI, Exam and A&P transcribed under direction and in the presence of Miguel Aschoff, MD.

## 2016-03-10 DIAGNOSIS — H353132 Nonexudative age-related macular degeneration, bilateral, intermediate dry stage: Secondary | ICD-10-CM | POA: Diagnosis not present

## 2016-03-29 DIAGNOSIS — H353132 Nonexudative age-related macular degeneration, bilateral, intermediate dry stage: Secondary | ICD-10-CM | POA: Diagnosis not present

## 2016-04-15 ENCOUNTER — Telehealth: Payer: Self-pay | Admitting: Family Medicine

## 2016-04-15 NOTE — Telephone Encounter (Signed)
Called pt to move appt to 1pm on 11/17 -knb

## 2016-04-16 ENCOUNTER — Ambulatory Visit (INDEPENDENT_AMBULATORY_CARE_PROVIDER_SITE_OTHER): Payer: Medicare Other | Admitting: Family Medicine

## 2016-04-16 VITALS — BP 139/68 | HR 60 | Temp 98.0°F | Ht 68.0 in | Wt 227.2 lb

## 2016-04-16 DIAGNOSIS — Z23 Encounter for immunization: Secondary | ICD-10-CM

## 2016-04-16 DIAGNOSIS — Z0000 Encounter for general adult medical examination without abnormal findings: Secondary | ICD-10-CM

## 2016-04-16 NOTE — Patient Instructions (Signed)
Ronald [name] , Thank you for taking time to come for your Medicare Wellness Visit. I appreciate your ongoing commitment to your health goals. Please review the following plan we discussed and let me know if I can assist you in the future.   These are the goals we discussed: Goals    . Increase water intake          Starting 04/16/16, I will increase my water intake to 4-5 glasses a day.       This is a list of the screening recommended for you and due dates:  Health Maintenance  Topic Date Due  . Complete foot exam   09/07/2016*  .  Hepatitis C: One time screening is recommended by Center for Disease Control  (CDC) for  adults born from 50 through 1965.   09/07/2016*  . Shingles Vaccine  04/16/2026*  . Hemoglobin A1C  08/31/2016  . Eye exam for diabetics  09/10/2016  . Colon Cancer Screening  08/02/2018  . Tetanus Vaccine  04/16/2026  . Flu Shot  Completed  . Pneumonia vaccines  Completed  *Topic was postponed. The date shown is not the original due date.   Preventive Care for Adults  A healthy lifestyle and preventive care can promote health and wellness. Preventive health guidelines for adults include the following key practices.  . A routine yearly physical is a good way to check with your health care provider about your health and preventive screening. It is a chance to share any concerns and updates on your health and to receive a thorough exam.  . Visit your dentist for a routine exam and preventive care every 6 months. Brush your teeth twice a day and floss once a day. Good oral hygiene prevents tooth decay and gum disease.  . The frequency of eye exams is based on your age, health, family medical history, use  of contact lenses, and other factors. Follow your health care provider's ecommendations for frequency of eye exams.  . Eat a healthy diet. Foods like vegetables, fruits, whole grains, low-fat dairy products, and lean protein foods contain the nutrients you need  without too many calories. Decrease your intake of foods high in solid fats, added sugars, and salt. Eat the right amount of calories for you. Get information about a proper diet from your health care provider, if necessary.  . Regular physical exercise is one of the most important things you can do for your health. Most adults should get at least 150 minutes of moderate-intensity exercise (any activity that increases your heart rate and causes you to sweat) each week. In addition, most adults need muscle-strengthening exercises on 2 or more days a week.  Silver Sneakers may be a benefit available to you. To determine eligibility, you may visit the website: www.silversneakers.com or contact program at 431-516-5944 Mon-Fri between 8AM-8PM.   . Maintain a healthy weight. The body mass index (BMI) is a screening tool to identify possible weight problems. It provides an estimate of body fat based on height and weight. Your health care provider can find your BMI and can help you achieve or maintain a healthy weight.   For adults 20 years and older: ? A BMI below 18.5 is considered underweight. ? A BMI of 18.5 to 24.9 is normal. ? A BMI of 25 to 29.9 is considered overweight. ? A BMI of 30 and above is considered obese.   . Maintain normal blood lipids and cholesterol levels by exercising and minimizing your intake  of saturated fat. Eat a balanced diet with plenty of fruit and vegetables. Blood tests for lipids and cholesterol should begin at age 68 and be repeated every 5 years. If your lipid or cholesterol levels are high, you are over 50, or you are at high risk for heart disease, you may need your cholesterol levels checked more frequently. Ongoing high lipid and cholesterol levels should be treated with medicines if diet and exercise are not working.  . If you smoke, find out from your health care provider how to quit. If you do not use tobacco, please do not start.  . If you choose to drink  alcohol, please do not consume more than 2 drinks per day. One drink is considered to be 12 ounces (355 mL) of beer, 5 ounces (148 mL) of wine, or 1.5 ounces (44 mL) of liquor.  . If you are 37-16 years old, ask your health care provider if you should take aspirin to prevent strokes.  . Use sunscreen. Apply sunscreen liberally and repeatedly throughout the day. You should seek shade when your shadow is shorter than you. Protect yourself by wearing long sleeves, pants, a wide-brimmed hat, and sunglasses year round, whenever you are outdoors.  . Once a month, do a whole body skin exam, using a mirror to look at the skin on your back. Tell your health care provider of new moles, moles that have irregular borders, moles that are larger than a pencil eraser, or moles that have changed in shape or color.

## 2016-04-16 NOTE — Progress Notes (Signed)
Subjective:   Ronald [name] is a 70 y.o. male who presents for Medicare Annual/Subsequent preventive examination.  Review of Systems:  N/A  Cardiac Risk Factors include: advanced age (>60men, >17 women);diabetes mellitus;dyslipidemia;obesity (BMI >30kg/m2);male gender;hypertension     Objective:    Vitals: BP 139/68 (BP Location: Right Arm)   Pulse 60   Temp 98 F (36.7 C) (Oral)   Ht 5\' 8"  (1.727 m)   Wt 227 lb 4 oz (103.1 kg)   BMI 34.55 kg/m   Body mass index is 34.55 kg/m.  Tobacco History  Smoking Status  . Former Smoker  . Packs/day: 3.00  . Years: 20.00  . Types: Cigarettes  . Quit date: 07/25/1986  Smokeless Tobacco  . Never Used     Counseling given: Not Answered   Past Medical History:  Diagnosis Date  . Acute renal failure superimposed on stage 3 chronic kidney disease ([place]) 10/07/2015  . Arthritis    "hips; back" (12/13/2014)  . CAD (coronary artery disease) 2007   a. s/p CABG- IMA-LAD, VG-Cx, VG-RCA, VG-diag in 1999. B. sp redo CABG- VG-OM, VG-RCA in 2007 due to VG disease. c. NSTEMI 11/2014 s/p DES to SVG-OM from the Y graft.d. PTCA/DES x 1 distal body of SVG to Diagonal.09/2015  . CHF (congestive heart failure) ([place])   . Chronic lower back pain   . CKD (chronic kidney disease), stage IV (HCC)    Creatinine 1.9 to 2.2  . COPD (chronic obstructive pulmonary disease) ([place])   . Deafness in left ear   . Degenerative disc disease, lumbar   . Dilated cardiomyopathy ([place]) 10/07/2015  . Dysrhythmia    bigeminy pvcs  . Emphysema   . Esophageal stricture 07/02/1998   EGD  . GERD (gastroesophageal reflux disease)   . History of gout    "last flareup was in 2007" (12/13/2014)  . History of hiatal hernia   . Hyperlipidemia   . Hypertension   . Ischemic cardiomyopathy 2006   EF 40% to 50% by 2D echo in 2006;  Echo 12/31/12: Mild LVH, EF 50-55%, normal wall motion.   . Renal artery stenosis ([place])   . Type II diabetes mellitus (HCC)    Diet control   . Walking  pneumonia 1990's   Past Surgical History:  Procedure Laterality Date  . CARDIAC CATHETERIZATION  "several"  . CARDIAC CATHETERIZATION N/A 12/13/2014   Procedure: Left Heart Cath and Coronary Angiography;  Surgeon: Jettie Booze, MD;  Location: [facility];  Service: Cardiovascular;  Laterality: N/A;  . CARDIAC CATHETERIZATION  12/13/2014   Procedure: Coronary Stent Intervention;  Surgeon: Jettie Booze, MD;  Location: [facility];  Service: Cardiovascular;;  . CARDIAC CATHETERIZATION N/A 10/07/2015   Procedure: Left Heart Cath and Cors/Grafts Angiography;  Surgeon: Burnell Blanks, MD;  Location: [facility];  Service: Cardiovascular;  Laterality: N/A;  . CARDIAC CATHETERIZATION N/A 10/07/2015   Procedure: Coronary Stent Intervention;  Surgeon: Burnell Blanks, MD;  Location: [facility];  Service: Cardiovascular;  Laterality: N/A;  . CORONARY ANGIOPLASTY  "several"  . CORONARY ANGIOPLASTY WITH STENT PLACEMENT  2005; 12/13/2014   "2; 1"  . CORONARY ARTERY BYPASS GRAFT  1996   CABG X5  . CORONARY ARTERY BYPASS GRAFT  March 2007   CABG X3  . ESOPHAGOGASTRODUODENOSCOPY (EGD) WITH ESOPHAGEAL DILATION  2000  . GREEN LIGHT LASER TURP (TRANSURETHRAL RESECTION OF PROSTATE  2000's   "not cancerous"  . HERNIA REPAIR    .  LAPAROSCOPIC CHOLECYSTECTOMY    . LUNG SURGERY  1996   "S/P CABG, had to put staple in lung after it had collapsed"  . UMBILICAL HERNIA REPAIR     w/chole   Family History  Problem Relation Age of Onset  . Heart attack Mother     MI  . Stroke Mother   . Heart disease Mother   . Hypertension Mother   . Hyperlipidemia Mother   . Heart disease Father   . Rheumatic fever Father    History  Sexual Activity  . Sexual activity: Not Currently    Outpatient Encounter Prescriptions as of 04/16/2016  Medication Sig  . acetaminophen (TYLENOL) 650 MG CR tablet Take 1,300 mg by mouth daily as needed for pain.  [name] allopurinol  (ZYLOPRIM) 100 MG tablet Take 1 tablet by mouth at  bedtime  . amLODipine (NORVASC) 5 MG tablet Take 1 tablet by mouth  daily  . aspirin 81 MG tablet Take 81 mg by mouth every morning.   [name] atorvastatin (LIPITOR) 40 MG tablet Take 1 tablet (40 mg total) by mouth daily.  . carvedilol (COREG) 12.5 MG tablet Take 12.5 mg by mouth 2 (two) times daily with a meal.  . clopidogrel (PLAVIX) 75 MG tablet Take 1 tablet by mouth  daily  . enalapril (VASOTEC) 10 MG tablet Take 10 mg by mouth daily.   . magnesium oxide (MAG-OX) 400 (241.3 Mg) MG tablet TAKE 1 TABLET BY MOUTH TWO TIMES DAILY.  . Multiple Vitamin (MULTIVITAMIN) tablet Take 1 tablet by mouth every morning.   . nitroGLYCERIN (NITROSTAT) 0.4 MG SL tablet Place 1 tablet (0.4 mg total) under the tongue every 5 (five) minutes as needed. May repeat up to three times.  . pantoprazole (PROTONIX) 40 MG tablet Take 1 tablet (40 mg total) by mouth daily.  . ranitidine (ZANTAC) 150 MG tablet Take 1 tablet (150 mg total) by mouth at bedtime.  . sodium bicarbonate 650 MG tablet Take 650 mg by mouth 2 (two) times daily.  . traMADol (ULTRAM) 50 MG tablet Take 50 mg by mouth daily as needed for pain.   No facility-administered encounter medications on file as of 04/16/2016.     Activities of Daily Living In your present state of health, do you have any difficulty performing the following activities: 04/16/2016 10/06/2015  Hearing? Tempie Donning  Vision? N N  Difficulty concentrating or making decisions? N N  Walking or climbing stairs? Y N  Dressing or bathing? N N  Doing errands, shopping? N N  Preparing Food and eating ? N -  Using the Toilet? N -  In the past six months, have you accidently leaked urine? Y -  Do you have problems with loss of bowel control? N -  Managing your Medications? Y -  Managing your Finances? Y -  Housekeeping or managing your Housekeeping? N -  Some recent data might be hidden    Patient Care Team: [name]., MD as  PCP - General (Unknown Physician Specialty) Kem Parkinson, MD as Consulting Physician (Ophthalmology) Burnell Blanks, MD as Consulting Physician (Cardiology) Murlean Iba, MD as Consulting Physician (Internal Medicine) Murrell Redden, MD as Consulting Physician (Urology)   Assessment:     Exercise Activities and Dietary recommendations Current Exercise Habits: The patient does not participate in regular exercise at present (yardwork), Exercise limited by: None identified;orthopedic condition(s)  Goals    . Increase water intake  Starting 04/16/16, I will increase my water intake to 4-5 glasses a day.      Fall Risk Fall Risk  04/16/2016 01/13/2015 12/03/2014  Falls in the past year? No No No  Risk for fall due to : - - History of fall(s);Other (Comment)  Risk for fall due to (comments): - - tripped, but not fallen   Depression Screen PHQ 2/9 Scores 04/16/2016 12/03/2014  PHQ - 2 Score 0 3  PHQ- 9 Score - 9  Exception Documentation - Medical reason    Cognitive Function     6CIT Screen 04/16/2016  What Year? 0 points  What month? 0 points  What time? 0 points  Count back from 20 0 points  Months in reverse 4 points  Repeat phrase 10 points  Total Score 14    Immunization History  Administered Date(s) Administered  . Influenza, High Dose Seasonal PF 03/18/2015, 03/02/2016  . Pneumococcal Conjugate-13 12/25/2013  . Pneumococcal Polysaccharide-23 03/04/2009  . Td 10/10/2003, 04/16/2016   Screening Tests Health Maintenance  Topic Date Due  . FOOT EXAM  09/07/2016 (Originally 12/10/1955)  . Hepatitis C Screening  09/07/2016 (Originally 06-19-1945)  . ZOSTAVAX  04/16/2026 (Originally 12/11/2003)  . HEMOGLOBIN A1C  08/31/2016  . OPHTHALMOLOGY EXAM  09/10/2016  . COLONOSCOPY  08/02/2018  . TETANUS/TDAP  04/16/2026  . INFLUENZA VACCINE  Completed  . PNA vac Low Risk Adult  Completed      Plan:  I have personally reviewed and addressed the Medicare  Annual Wellness questionnaire and have noted the following in the patient's chart:  A. Medical and social history B. Use of alcohol, tobacco or illicit drugs  C. Current medications and supplements D. Functional ability and status E.  Nutritional status F.  Physical activity G. Advance directives H. List of other physicians I.  Hospitalizations, surgeries, and ER visits in previous 12 months J.  [place] such as hearing and vision if needed, cognitive and depression L. Referrals and appointments - none  In addition, I have reviewed and discussed with patient certain preventive protocols, quality metrics, and best practice recommendations. A written personalized care plan for preventive services as well as general preventive health recommendations were provided to patient.  See attached scanned questionnaire for additional information.   Signed,  Fabio Neighbors, LPN Nurse Health Advisor  MD Recommendations: Follow up on Hepatitis C screening and Diabetic foot exam.  I have reviewed the health advisors note, was  available for consultation and I agree with documentation and plan. Miguel Aschoff MD [facility]

## 2016-04-20 ENCOUNTER — Other Ambulatory Visit: Payer: Self-pay | Admitting: Cardiovascular Disease

## 2016-04-30 ENCOUNTER — Other Ambulatory Visit: Payer: Self-pay

## 2016-04-30 MED ORDER — CARVEDILOL 12.5 MG PO TABS: 12.5000 mg | ORAL_TABLET | Freq: Two times a day (BID) | ORAL | 3 refills | Status: DC

## 2016-06-03 DIAGNOSIS — M545 Low back pain: Secondary | ICD-10-CM | POA: Diagnosis not present

## 2016-06-03 DIAGNOSIS — M5416 Radiculopathy, lumbar region: Secondary | ICD-10-CM | POA: Diagnosis not present

## 2016-06-03 DIAGNOSIS — R600 Localized edema: Secondary | ICD-10-CM | POA: Diagnosis not present

## 2016-06-03 DIAGNOSIS — I739 Peripheral vascular disease, unspecified: Secondary | ICD-10-CM | POA: Diagnosis not present

## 2016-06-03 DIAGNOSIS — I10 Essential (primary) hypertension: Secondary | ICD-10-CM | POA: Diagnosis not present

## 2016-06-03 DIAGNOSIS — N183 Chronic kidney disease, stage 3 (moderate): Secondary | ICD-10-CM | POA: Diagnosis not present

## 2016-06-03 DIAGNOSIS — I701 Atherosclerosis of renal artery: Secondary | ICD-10-CM | POA: Diagnosis not present

## 2016-06-07 DIAGNOSIS — E872 Acidosis: Secondary | ICD-10-CM | POA: Diagnosis not present

## 2016-06-07 DIAGNOSIS — N183 Chronic kidney disease, stage 3 (moderate): Secondary | ICD-10-CM | POA: Diagnosis not present

## 2016-06-07 DIAGNOSIS — I701 Atherosclerosis of renal artery: Secondary | ICD-10-CM | POA: Diagnosis not present

## 2016-06-07 DIAGNOSIS — E1129 Type 2 diabetes mellitus with other diabetic kidney complication: Secondary | ICD-10-CM | POA: Diagnosis not present

## 2016-06-07 DIAGNOSIS — I129 Hypertensive chronic kidney disease with stage 1 through stage 4 chronic kidney disease, or unspecified chronic kidney disease: Secondary | ICD-10-CM | POA: Diagnosis not present

## 2016-06-08 ENCOUNTER — Other Ambulatory Visit: Payer: Self-pay | Admitting: Orthopedic Surgery

## 2016-06-08 DIAGNOSIS — M545 Low back pain: Secondary | ICD-10-CM

## 2016-06-10 DIAGNOSIS — I701 Atherosclerosis of renal artery: Secondary | ICD-10-CM | POA: Diagnosis not present

## 2016-06-10 DIAGNOSIS — N183 Chronic kidney disease, stage 3 (moderate): Secondary | ICD-10-CM | POA: Diagnosis not present

## 2016-06-10 DIAGNOSIS — I129 Hypertensive chronic kidney disease with stage 1 through stage 4 chronic kidney disease, or unspecified chronic kidney disease: Secondary | ICD-10-CM | POA: Diagnosis not present

## 2016-06-10 DIAGNOSIS — E1129 Type 2 diabetes mellitus with other diabetic kidney complication: Secondary | ICD-10-CM | POA: Diagnosis not present

## 2016-06-22 ENCOUNTER — Ambulatory Visit
Admission: RE | Admit: 2016-06-22 | Discharge: 2016-06-22 | Disposition: A | Discharge status: Home / Self Care | Payer: Medicare Other | Source: Ambulatory Visit | Attending: Orthopedic Surgery | Admitting: Orthopedic Surgery

## 2016-06-22 DIAGNOSIS — M47816 Spondylosis without myelopathy or radiculopathy, lumbar region: Secondary | ICD-10-CM | POA: Diagnosis not present

## 2016-06-22 DIAGNOSIS — M545 Low back pain: Secondary | ICD-10-CM

## 2016-06-22 DIAGNOSIS — M48061 Spinal stenosis, lumbar region without neurogenic claudication: Secondary | ICD-10-CM | POA: Insufficient documentation

## 2016-06-22 DIAGNOSIS — M5126 Other intervertebral disc displacement, lumbar region: Secondary | ICD-10-CM | POA: Insufficient documentation

## 2016-06-22 DIAGNOSIS — M7138 Other bursal cyst, other site: Secondary | ICD-10-CM | POA: Insufficient documentation

## 2016-06-22 DIAGNOSIS — M4316 Spondylolisthesis, lumbar region: Secondary | ICD-10-CM | POA: Diagnosis not present

## 2016-06-25 ENCOUNTER — Ambulatory Visit (INDEPENDENT_AMBULATORY_CARE_PROVIDER_SITE_OTHER): Payer: Medicare Other | Admitting: Vascular Surgery

## 2016-06-25 DIAGNOSIS — E1121 Type 2 diabetes mellitus with diabetic nephropathy: Secondary | ICD-10-CM

## 2016-06-25 DIAGNOSIS — I70213 Atherosclerosis of native arteries of extremities with intermittent claudication, bilateral legs: Secondary | ICD-10-CM

## 2016-06-25 DIAGNOSIS — M5416 Radiculopathy, lumbar region: Secondary | ICD-10-CM | POA: Diagnosis not present

## 2016-06-25 DIAGNOSIS — I150 Renovascular hypertension: Secondary | ICD-10-CM

## 2016-06-25 DIAGNOSIS — I25110 Atherosclerotic heart disease of native coronary artery with unstable angina pectoris: Secondary | ICD-10-CM | POA: Diagnosis not present

## 2016-06-25 DIAGNOSIS — N183 Chronic kidney disease, stage 3 unspecified: Secondary | ICD-10-CM

## 2016-06-25 DIAGNOSIS — I70219 Atherosclerosis of native arteries of extremities with intermittent claudication, unspecified extremity: Secondary | ICD-10-CM | POA: Insufficient documentation

## 2016-06-25 NOTE — Patient Instructions (Signed)

## 2016-06-25 NOTE — Assessment & Plan Note (Signed)
blood glucose control important in reducing the progression of atherosclerotic disease. Also, involved in wound healing. On appropriate medications.  

## 2016-06-25 NOTE — Assessment & Plan Note (Signed)
Discussed control of blood pressure and sugars is important to avoid worsening of his renal function. This would be a risk factor for contrast nephropathy if we need to perform any angiography in the future.

## 2016-06-25 NOTE — [place] Notes (Signed)
Patient ID: [name], male   DOB: August 03, 1945, 71 y.o.   MRN: [id number]  Chief Complaint  Patient presents with  . New Evaluation    PVD    HPI Ronald [name] is a 71 y.o. male.  I am asked to see the patient by Dr. Mack Guise for evaluation for PAD.  The patient reports Pain starts in his hip and thigh areas and radiates down his legs. This is worsened with activity. He says he can always walk about 50-75 feet before having to stop and rest secondary to pain in his legs. The right leg is the more severely affected of the 2 legs, but the left leg does provide discomfort and weakness as well. He does not have a history of ulceration or infection. The pain does wake him at night sometimes but when it does it is usually in his thigh and hip area and not in his foot. This has been going on for at least 3-4 months with gradual worsening of the symptoms. His wife says that has been going on a lot longer than that. There was no clear inciting event or causative factor. He has a long history of multiple atherosclerotic risk factors and has had 2 coronary bypasses previously. He also has stage 3-4 chronic kidney disease and has been told to avoid contrast if possible.   Past Medical History:  Diagnosis Date  . Acute renal failure superimposed on stage 3 chronic kidney disease ([place]) 10/07/2015  . Arthritis    "hips; back" (12/13/2014)  . CAD (coronary artery disease) 2007   a. s/p CABG- IMA-LAD, VG-Cx, VG-RCA, VG-diag in 1999. B. sp redo CABG- VG-OM, VG-RCA in 2007 due to VG disease. c. NSTEMI 11/2014 s/p DES to SVG-OM from the Y graft.d. PTCA/DES x 1 distal body of SVG to Diagonal.09/2015  . CHF (congestive heart failure) ([place])   . Chronic lower back pain   . CKD (chronic kidney disease), stage IV (HCC)    Creatinine 1.9 to 2.2  . COPD (chronic obstructive pulmonary disease) ([place])   . Deafness in left ear   . Degenerative disc disease, lumbar   . Dilated cardiomyopathy ([place]) 10/07/2015  .  Dysrhythmia    bigeminy pvcs  . Emphysema   . Esophageal stricture 07/02/1998   EGD  . GERD (gastroesophageal reflux disease)   . History of gout    "last flareup was in 2007" (12/13/2014)  . History of hiatal hernia   . Hyperlipidemia   . Hypertension   . Ischemic cardiomyopathy 2006   EF 40% to 50% by 2D echo in 2006;  Echo 12/31/12: Mild LVH, EF 50-55%, normal wall motion.   . Renal artery stenosis ([place])   . Type II diabetes mellitus (HCC)    Diet control   . Walking pneumonia 1990's    Past Surgical History:  Procedure Laterality Date  . CARDIAC CATHETERIZATION  "several"  . CARDIAC CATHETERIZATION N/A 12/13/2014   Procedure: Left Heart Cath and Coronary Angiography;  Surgeon: Jettie Booze, MD;  Location: [place] [facility];  Service: Cardiovascular;  Laterality: N/A;  . CARDIAC CATHETERIZATION  12/13/2014   Procedure: Coronary Stent Intervention;  Surgeon: Jettie Booze, MD;  Location: [place] [facility];  Service: Cardiovascular;;  . CARDIAC CATHETERIZATION N/A 10/07/2015   Procedure: Left Heart Cath and Cors/Grafts Angiography;  Surgeon: Burnell Blanks, MD;  Location: [place] [facility];  Service: Cardiovascular;  Laterality: N/A;  . CARDIAC CATHETERIZATION N/A 10/07/2015  Procedure: Coronary Stent Intervention;  Surgeon: Burnell Blanks, MD;  Location: [place] [facility];  Service: Cardiovascular;  Laterality: N/A;  . CORONARY ANGIOPLASTY  "several"  . CORONARY ANGIOPLASTY WITH STENT PLACEMENT  2005; 12/13/2014   "2; 1"  . CORONARY ARTERY BYPASS GRAFT  1996   CABG X5  . CORONARY ARTERY BYPASS GRAFT  March 2007   CABG X3  . ESOPHAGOGASTRODUODENOSCOPY (EGD) WITH ESOPHAGEAL DILATION  2000  . GREEN LIGHT LASER TURP (TRANSURETHRAL RESECTION OF PROSTATE  2000's   "not cancerous"  . HERNIA REPAIR    . LAPAROSCOPIC CHOLECYSTECTOMY    . LUNG SURGERY  1996   "S/P CABG, had to put staple in lung after it had collapsed"  . UMBILICAL HERNIA REPAIR     w/chole      Family History  Problem Relation Age of Onset  . Heart attack Mother     MI  . Stroke Mother   . Heart disease Mother   . Hypertension Mother   . Hyperlipidemia Mother   . Heart disease Father   . Rheumatic fever Father      Social History Social History  Substance Use Topics  . Smoking status: Former Smoker    Packs/day: 3.00    Years: 20.00    Types: Cigarettes    Quit date: 07/25/1986  . Smokeless tobacco: Never Used  . Alcohol use No  No IVDU  Allergies  Allergen Reactions  . Ciprofloxacin     GI upset  . Hydrochlorothiazide Other (See Comments)    dehydrates Dehydration  . Hydrocodone     Stomach upset Stomach upset  . Hydrocodone-Acetaminophen     Stomach upset  . Hydrocodone-Acetaminophen Nausea Only  . Sulfa Antibiotics Other (See Comments)    Cant recall  . Sulfacetamide Sodium Other (See Comments)    Cant recall  . Penicillins Hives and Rash    Has patient had a PCN reaction causing immediate rash, facial/tongue/throat swelling, SOB or lightheadedness with hypotension: YES Has patient had a PCN reaction causing severe rash involving mucus membranes or skin necrosis: NO Has patient had a PCN reaction that required hospitalization NO Has patient had a PCN reaction occurring within the last 10 years:NO If all of the above answers are "NO", then may proceed with Cephalosporin use.    Current Outpatient Prescriptions  Medication Sig Dispense Refill  . acetaminophen (TYLENOL) 650 MG CR tablet Take 1,300 mg by mouth daily as needed for pain.    [name] allopurinol (ZYLOPRIM) 100 MG tablet Take 1 tablet by mouth at  bedtime 90 tablet 3  . amLODipine (NORVASC) 5 MG tablet Take 1 tablet by mouth  daily 90 tablet 3  . aspirin 81 MG tablet Take 81 mg by mouth every morning.     [name] atorvastatin (LIPITOR) 40 MG tablet Take 1 tablet (40 mg total) by mouth daily. 90 tablet 3  . carvedilol (COREG) 12.5 MG tablet Take 1 tablet (12.5 mg total) by mouth 2 (two) times  daily with a meal. 180 tablet 3  . clopidogrel (PLAVIX) 75 MG tablet Take 1 tablet by mouth  daily 90 tablet 3  . enalapril (VASOTEC) 10 MG tablet Take 10 mg by mouth daily.     . magnesium oxide (MAG-OX) 400 (241.3 Mg) MG tablet TAKE 1 TABLET BY MOUTH TWO TIMES DAILY. 180 tablet 3  . Multiple Vitamin (MULTIVITAMIN) tablet Take 1 tablet by mouth every morning.     [name] NITROSTAT 0.4 MG SL tablet PLACE  1 TABLET UNDER TONGUE EVERY 5 MINUTES AS NEEDED UP TO 3 TIMES 25 tablet 3  . pantoprazole (PROTONIX) 40 MG tablet Take 1 tablet (40 mg total) by mouth daily. 30 tablet 12  . ranitidine (ZANTAC) 150 MG tablet Take 1 tablet (150 mg total) by mouth at bedtime. 90 tablet 3  . sodium bicarbonate 650 MG tablet Take 650 mg by mouth 2 (two) times daily.    . traMADol (ULTRAM) 50 MG tablet Take 50 mg by mouth daily as needed for pain.     No current facility-administered medications for this visit.       REVIEW OF SYSTEMS (Negative unless checked)  Constitutional: [] Weight loss  [] Fever  [] Chills Cardiac: [] Chest pain   [] Chest pressure   [x] Palpitations   [] Shortness of breath when laying flat   [] Shortness of breath at rest   [] Shortness of breath with exertion. Vascular:  [x] Pain in legs with walking   [x] Pain in legs at rest   [] Pain in legs when laying flat   [x] Claudication   [] Pain in feet when walking  [] Pain in feet at rest  [] Pain in feet when laying flat   [] History of DVT   [] Phlebitis   [] Swelling in legs   [] Varicose veins   [] Non-healing ulcers Pulmonary:   [] Uses home oxygen   [] Productive cough   [] Hemoptysis   [] Wheeze  [] COPD   [] Asthma Neurologic:  [] Dizziness  [] Blackouts   [] Seizures   [] History of stroke   [] History of TIA  [] Aphasia   [] Temporary blindness   [] Dysphagia   [] Weakness or numbness in arms   [] Weakness or numbness in legs Musculoskeletal:  [x] Arthritis   [] Joint swelling   [x] Joint pain   [] Low back pain Hematologic:  [] Easy bruising  [] Easy bleeding   [] Hypercoagulable  state   [] Anemic  [] Hepatitis Gastrointestinal:  [] Blood in stool   [] Vomiting blood  [] Gastroesophageal reflux/heartburn   [] Abdominal pain Genitourinary:  [] Chronic kidney disease   [] Difficult urination  [] Frequent urination  [] Burning with urination   [] Hematuria Skin:  [] Rashes   [] Ulcers   [] Wounds Psychological:  [] History of anxiety   []  History of major depression.    Physical Exam There were no vitals taken for this visit. Gen:  WD/WN, NAD Head: [place]/AT, No temporalis wasting. Prominent temp pulse not noted. Ear/Nose/Throat: Hearing grossly intact, nares w/o erythema or drainage, oropharynx w/o Erythema/Exudate Eyes: Conjunctiva clear, sclera non-icteric  Neck: trachea midline.  No JVD.  Pulmonary:  Good air movement, respirations not labored, no use of accessory muscles Cardiac: RRR, normal S1, S2 Vascular:  Vessel Right Left  Radial Palpable Palpable  Ulnar Palpable Palpable  Brachial Palpable Palpable  Carotid Palpable, without bruit Palpable, without bruit  Aorta Not palpable N/A  Femoral Palpable Palpable  Popliteal 1+ Palpable 1+ Palpable  PT 1+ Palpable 1+ Palpable  DP Trace Palpable 1+ Palpable   Gastrointestinal: soft, non-tender/non-distended. No guarding/reflex. No masses, surgical incisions, or scars. Musculoskeletal: M/S 5/5 throughout.  Extremities without ischemic changes.  No deformity or atrophy. Trace bilateral lower extremity edema. Neurologic: Sensation grossly intact in extremities.  Symmetrical.  Speech is fluent. Motor exam as listed above. Psychiatric: Judgment intact, Mood & affect appropriate for pt's clinical situation. Dermatologic: No rashes or ulcers noted.  No cellulitis or open wounds. Lymph : No Cervical, Axillary, or Inguinal lymphadenopathy.   Radiology Mr Lumbar Spine Wo Contrast  Result Date: 06/22/2016 CLINICAL DATA:  4-5 months of low back pain, bilateral leg and feet pain, RIGHT hip  pain worse with sitting and standing. EXAM: MRI  LUMBAR SPINE WITHOUT CONTRAST TECHNIQUE: Multiplanar, multisequence MR imaging of the lumbar spine was performed. No intravenous contrast was administered. COMPARISON:  Lumbar spine radiographs December 04, 2014 FINDINGS: SEGMENTATION: For the purposes of this report, the last well-formed intervertebral disc will be described as L5-S1. ALIGNMENT: Maintenance of the lumbar lordosis. Grade 1 (3 mm) L4-5 anterolisthesis without spondylolysis ; measuring 6 mm on prior radiograph. Mild broad levoscoliosis on the axial sequences, as seen on prior standing radiographs. VERTEBRAE:Vertebral bodies are intact. Mild L4-5 disc height loss with mild desiccation of the L1-2 thru L4-5 discs. Mild subacute to chronic discogenic endplate changes M0-9 thru L4-5. No abnormal bone marrow signal to suggest acute osseous process. CONUS MEDULLARIS: Conus medullaris terminates at T12-L1 and demonstrates normal morphology and signal characteristics. Cauda equina is normal. PARASPINAL AND SOFT TISSUES: Included prevertebral and paraspinal soft tissues are normal. T2 bright cysts in the kidneys bilaterally, measuring at least 5.4 cm. Ectatic infrarenal aorta at 2.7 cm. DISC LEVELS: L1-2: No disc bulge, canal stenosis nor neural foraminal narrowing. L2-3: Small LEFT extraforaminal disc protrusion. Mild facet arthropathy and ligamentum flavum redundancy without canal stenosis or neural foraminal narrowing. L3-4: Small broad-based disc bulge asymmetric to the LEFT with LEFT extraforaminal annular fissure. Mild facet arthropathy and ligamentum flavum redundancy without canal stenosis or neural foraminal narrowing. L4-5: Anterolisthesis. Small broad-based disc bulge eccentric to the RIGHT, multifocal annular fissure. Moderate to severe facet arthropathy and ligamentum flavum redundancy with trace facet effusions which are likely reactive. Bilateral dorsal facet synovial cysts measuring up to 9 mm within the paraspinal soft tissues in addition to the  ventral RIGHT L5-S1 facet synovial cyst measuring 4 mm, which effaces the RIGHT lateral recess and displaces the traversing RIGHT L5 nerve. 2 mm ventral LEFT facet synovial cyst extending to the neural foramen. Moderate canal stenosis. Mild to moderate bilateral neural foraminal narrowing. L5-S1: No disc bulge, canal stenosis nor neural foraminal narrowing. Moderate facet arthropathy with trace RIGHT facet effusion. IMPRESSION: Grade 1 L4-5 anterolisthesis (less than on prior radiograph suspicious for dynamic instability which would be better assessed on flexion extension radiographs). Bilateral L4-5 facet synovial cysts, displacing the traversing RIGHT L5 nerve. No spondylolysis. Moderate canal stenosis L4-5. Mild to moderate L4-5 neural foraminal narrowing. Multilevel annular fissures. Electronically Signed   By: Elon Alas M.D.   On: 06/22/2016 15:08    Labs No results found for this or any previous visit (from the past 2160 hour(s)).  Assessment/Plan:  CKD (chronic kidney disease) stage 3, GFR 30-59 ml/min Discussed control of blood pressure and sugars is important to avoid worsening of his renal function. This would be a risk factor for contrast nephropathy if we need to perform any angiography in the future.  Diabetes mellitus with nephropathy blood glucose control important in reducing the progression of atherosclerotic disease. Also, involved in wound healing. On appropriate medications.   BP (high blood pressure) blood pressure control important in reducing the progression of atherosclerotic disease. On appropriate oral medications.   Coronary artery disease involving native coronary artery of native heart with unstable angina pectoris [facility]) S/p CABG  Atherosclerosis of native arteries of extremity with intermittent claudication [facility]) The patient describes symptoms worrisome for short distance claudication of both lower extremities. There is likely a musculoskeletal and a  neuropathic component present as well, but given a patient with a litany of atherosclerotic risk factors certainly arterial insufficiency is high on the differential diagnosis. Noninvasive studies need  to be performed for further evaluation particularly given his renal insufficiency. ABIs and duplex B planned. We discussed the natural history and the pathophysiology of peripheral arterial disease. We discussed the low risk of limb loss. We discussed the importance of appropriate medications such as dual antiplatelet therapy and a statin agent which she is already on. We discussed increasing exercise to build collaterals. I will see the patient back following his noninvasive studies to discuss the results and determine further treatment options.      Leotis Pain 06/25/2016, 4:13 PM   This note was created with Dragon medical transcription system.  Any errors from dictation are unintentional.

## 2016-06-25 NOTE — Assessment & Plan Note (Signed)
blood pressure control important in reducing the progression of atherosclerotic disease. On appropriate oral medications.  

## 2016-06-25 NOTE — Assessment & Plan Note (Signed)
The patient describes symptoms worrisome for short distance claudication of both lower extremities. There is likely a musculoskeletal and a neuropathic component present as well, but given a patient with a litany of atherosclerotic risk factors certainly arterial insufficiency is high on the differential diagnosis. Noninvasive studies need to be performed for further evaluation particularly given his renal insufficiency. ABIs and duplex B planned. We discussed the natural history and the pathophysiology of peripheral arterial disease. We discussed the low risk of limb loss. We discussed the importance of appropriate medications such as dual antiplatelet therapy and a statin agent which she is already on. We discussed increasing exercise to build collaterals. I will see the patient back following his noninvasive studies to discuss the results and determine further treatment options.

## 2016-06-25 NOTE — Assessment & Plan Note (Signed)
S/p CABG 

## 2016-07-07 ENCOUNTER — Other Ambulatory Visit (INDEPENDENT_AMBULATORY_CARE_PROVIDER_SITE_OTHER): Payer: Medicare Other

## 2016-07-07 ENCOUNTER — Encounter (INDEPENDENT_AMBULATORY_CARE_PROVIDER_SITE_OTHER): Payer: Self-pay

## 2016-07-07 DIAGNOSIS — I70213 Atherosclerosis of native arteries of extremities with intermittent claudication, bilateral legs: Secondary | ICD-10-CM | POA: Diagnosis not present

## 2016-07-13 ENCOUNTER — Encounter (HOSPITAL_COMMUNITY): Payer: Self-pay | Admitting: Emergency Medicine

## 2016-07-13 ENCOUNTER — Inpatient Hospital Stay (HOSPITAL_COMMUNITY)
Admission: EM | Admit: 2016-07-13 | Discharge: 2016-07-14 | DRG: 303 | Disposition: A | Discharge status: Home / Self Care | Payer: Medicare Other | Attending: Interventional Cardiology | Admitting: Interventional Cardiology

## 2016-07-13 ENCOUNTER — Other Ambulatory Visit: Payer: Self-pay

## 2016-07-13 ENCOUNTER — Emergency Department (HOSPITAL_COMMUNITY): Payer: Medicare Other

## 2016-07-13 DIAGNOSIS — Z79899 Other long term (current) drug therapy: Secondary | ICD-10-CM

## 2016-07-13 DIAGNOSIS — N183 Chronic kidney disease, stage 3 (moderate): Secondary | ICD-10-CM | POA: Diagnosis not present

## 2016-07-13 DIAGNOSIS — I130 Hypertensive heart and chronic kidney disease with heart failure and stage 1 through stage 4 chronic kidney disease, or unspecified chronic kidney disease: Secondary | ICD-10-CM | POA: Diagnosis not present

## 2016-07-13 DIAGNOSIS — K219 Gastro-esophageal reflux disease without esophagitis: Secondary | ICD-10-CM | POA: Diagnosis present

## 2016-07-13 DIAGNOSIS — N184 Chronic kidney disease, stage 4 (severe): Secondary | ICD-10-CM | POA: Diagnosis not present

## 2016-07-13 DIAGNOSIS — I10 Essential (primary) hypertension: Secondary | ICD-10-CM | POA: Diagnosis not present

## 2016-07-13 DIAGNOSIS — I25119 Atherosclerotic heart disease of native coronary artery with unspecified angina pectoris: Secondary | ICD-10-CM | POA: Diagnosis present

## 2016-07-13 DIAGNOSIS — I701 Atherosclerosis of renal artery: Secondary | ICD-10-CM | POA: Diagnosis not present

## 2016-07-13 DIAGNOSIS — I25110 Atherosclerotic heart disease of native coronary artery with unstable angina pectoris: Principal | ICD-10-CM | POA: Diagnosis present

## 2016-07-13 DIAGNOSIS — M1A9XX1 Chronic gout, unspecified, with tophus (tophi): Secondary | ICD-10-CM | POA: Diagnosis not present

## 2016-07-13 DIAGNOSIS — R0600 Dyspnea, unspecified: Secondary | ICD-10-CM | POA: Diagnosis present

## 2016-07-13 DIAGNOSIS — R0602 Shortness of breath: Secondary | ICD-10-CM | POA: Diagnosis not present

## 2016-07-13 DIAGNOSIS — E1121 Type 2 diabetes mellitus with diabetic nephropathy: Secondary | ICD-10-CM | POA: Diagnosis not present

## 2016-07-13 DIAGNOSIS — Z955 Presence of coronary angioplasty implant and graft: Secondary | ICD-10-CM

## 2016-07-13 DIAGNOSIS — I200 Unstable angina: Secondary | ICD-10-CM

## 2016-07-13 DIAGNOSIS — I509 Heart failure, unspecified: Secondary | ICD-10-CM | POA: Diagnosis not present

## 2016-07-13 DIAGNOSIS — Z880 Allergy status to penicillin: Secondary | ICD-10-CM

## 2016-07-13 DIAGNOSIS — I420 Dilated cardiomyopathy: Secondary | ICD-10-CM | POA: Diagnosis present

## 2016-07-13 DIAGNOSIS — I252 Old myocardial infarction: Secondary | ICD-10-CM | POA: Diagnosis not present

## 2016-07-13 DIAGNOSIS — I1310 Hypertensive heart and chronic kidney disease without heart failure, with stage 1 through stage 4 chronic kidney disease, or unspecified chronic kidney disease: Secondary | ICD-10-CM

## 2016-07-13 DIAGNOSIS — E785 Hyperlipidemia, unspecified: Secondary | ICD-10-CM | POA: Diagnosis not present

## 2016-07-13 DIAGNOSIS — Z87891 Personal history of nicotine dependence: Secondary | ICD-10-CM

## 2016-07-13 DIAGNOSIS — Z7982 Long term (current) use of aspirin: Secondary | ICD-10-CM | POA: Diagnosis not present

## 2016-07-13 DIAGNOSIS — I2510 Atherosclerotic heart disease of native coronary artery without angina pectoris: Secondary | ICD-10-CM | POA: Diagnosis present

## 2016-07-13 DIAGNOSIS — Z885 Allergy status to narcotic agent status: Secondary | ICD-10-CM

## 2016-07-13 DIAGNOSIS — Z881 Allergy status to other antibiotic agents status: Secondary | ICD-10-CM

## 2016-07-13 DIAGNOSIS — J449 Chronic obstructive pulmonary disease, unspecified: Secondary | ICD-10-CM | POA: Diagnosis not present

## 2016-07-13 DIAGNOSIS — E782 Mixed hyperlipidemia: Secondary | ICD-10-CM

## 2016-07-13 DIAGNOSIS — E1122 Type 2 diabetes mellitus with diabetic chronic kidney disease: Secondary | ICD-10-CM | POA: Diagnosis not present

## 2016-07-13 DIAGNOSIS — Z9079 Acquired absence of other genital organ(s): Secondary | ICD-10-CM | POA: Diagnosis not present

## 2016-07-13 DIAGNOSIS — Z888 Allergy status to other drugs, medicaments and biological substances status: Secondary | ICD-10-CM

## 2016-07-13 DIAGNOSIS — Z951 Presence of aortocoronary bypass graft: Secondary | ICD-10-CM | POA: Diagnosis not present

## 2016-07-13 DIAGNOSIS — R079 Chest pain, unspecified: Secondary | ICD-10-CM | POA: Diagnosis not present

## 2016-07-13 DIAGNOSIS — R05 Cough: Secondary | ICD-10-CM | POA: Diagnosis not present

## 2016-07-13 LAB — BASIC METABOLIC PANEL
Anion gap: 10 (ref 5–15)
BUN: 38 mg/dL — AB (ref 6–20)
CO2: 21 mmol/L — ABNORMAL LOW (ref 22–32)
Calcium: 9.4 mg/dL (ref 8.9–10.3)
Chloride: 109 mmol/L (ref 101–111)
Creatinine, Ser: 2.49 mg/dL — ABNORMAL HIGH (ref 0.61–1.24)
GFR calc Af Amer: 28 mL/min — ABNORMAL LOW (ref >60)
GFR, EST NON AFRICAN AMERICAN: 24 mL/min — AB (ref >60)
Glucose, Bld: 157 mg/dL — ABNORMAL HIGH (ref 65–99)
POTASSIUM: 4.7 mmol/L (ref 3.5–5.1)
SODIUM: 140 mmol/L (ref 135–145)

## 2016-07-13 LAB — CBC
HEMATOCRIT: 40.5 % (ref 39.0–52.0)
Hemoglobin: 13.2 g/dL (ref 13.0–17.0)
MCH: 28.7 pg (ref 26.0–34.0)
MCHC: 32.6 g/dL (ref 30.0–36.0)
MCV: 88 fL (ref 78.0–100.0)
PLATELETS: 158 10*3/uL (ref 150–400)
RBC: 4.6 MIL/uL (ref 4.22–5.81)
RDW: 14 % (ref 11.5–15.5)
WBC: 7.3 10*3/uL (ref 4.0–10.5)

## 2016-07-13 LAB — TROPONIN I

## 2016-07-13 LAB — I-STAT TROPONIN, ED: Troponin i, poc: 0 ng/mL (ref 0.00–0.08)

## 2016-07-13 LAB — HEPARIN LEVEL (UNFRACTIONATED): HEPARIN UNFRACTIONATED: 0.52 [IU]/mL (ref 0.30–0.70)

## 2016-07-13 MED ORDER — PANTOPRAZOLE SODIUM 40 MG PO TBEC
40.0000 mg | DELAYED_RELEASE_TABLET | Freq: Every day | ORAL | Status: DC
  Administered 2016-07-13: 40 mg via ORAL
  Filled 2016-07-13 (×2): qty 1

## 2016-07-13 MED ORDER — NITROGLYCERIN 0.4 MG SL SUBL: 0.4000 mg | SUBLINGUAL_TABLET | SUBLINGUAL | Status: DC | PRN

## 2016-07-13 MED ORDER — AMLODIPINE BESYLATE 5 MG PO TABS
5.0000 mg | ORAL_TABLET | Freq: Every day | ORAL | Status: DC
  Administered 2016-07-13: 5 mg via ORAL
  Filled 2016-07-13 (×2): qty 1

## 2016-07-13 MED ORDER — ACETAMINOPHEN 325 MG PO TABS: 650.0000 mg | ORAL_TABLET | ORAL | Status: DC | PRN

## 2016-07-13 MED ORDER — SODIUM BICARBONATE 650 MG PO TABS
650.0000 mg | ORAL_TABLET | Freq: Two times a day (BID) | ORAL | Status: DC
  Administered 2016-07-13: 650 mg via ORAL
  Filled 2016-07-13 (×2): qty 1

## 2016-07-13 MED ORDER — ASPIRIN EC 81 MG PO TBEC
81.0000 mg | DELAYED_RELEASE_TABLET | Freq: Every day | ORAL | Status: DC
  Administered 2016-07-14: 81 mg via ORAL
  Filled 2016-07-13: qty 1

## 2016-07-13 MED ORDER — HEPARIN BOLUS VIA INFUSION
4000.0000 [IU] | Freq: Once | INTRAVENOUS | Status: AC
  Administered 2016-07-13: 4000 [IU] via INTRAVENOUS
  Filled 2016-07-13: qty 4000

## 2016-07-13 MED ORDER — MAGNESIUM OXIDE 400 (241.3 MG) MG PO TABS
400.0000 mg | ORAL_TABLET | Freq: Two times a day (BID) | ORAL | Status: DC
  Administered 2016-07-13: 400 mg via ORAL
  Filled 2016-07-13 (×2): qty 1

## 2016-07-13 MED ORDER — SODIUM CHLORIDE 0.9 % IV SOLN
INTRAVENOUS | Status: DC
Start: 2016-07-13 — End: 2016-07-14
  Administered 2016-07-13: 19:00:00 via INTRAVENOUS

## 2016-07-13 MED ORDER — ALLOPURINOL 100 MG PO TABS
100.0000 mg | ORAL_TABLET | Freq: Every day | ORAL | Status: DC
  Administered 2016-07-13: 100 mg via ORAL
  Filled 2016-07-13: qty 1

## 2016-07-13 MED ORDER — CARVEDILOL 12.5 MG PO TABS
12.5000 mg | ORAL_TABLET | Freq: Two times a day (BID) | ORAL | Status: DC
  Administered 2016-07-13 – 2016-07-14 (×2): 12.5 mg via ORAL
  Filled 2016-07-13 (×2): qty 1

## 2016-07-13 MED ORDER — ONDANSETRON HCL 4 MG/2ML IJ SOLN: 4.0000 mg | Freq: Four times a day (QID) | INTRAMUSCULAR | Status: DC | PRN

## 2016-07-13 MED ORDER — ASPIRIN 325 MG PO TABS
325.0000 mg | ORAL_TABLET | Freq: Once | ORAL | Status: AC
  Administered 2016-07-13: 325 mg via ORAL
  Filled 2016-07-13: qty 1

## 2016-07-13 MED ORDER — ATORVASTATIN CALCIUM 40 MG PO TABS
40.0000 mg | ORAL_TABLET | Freq: Every day | ORAL | Status: DC
  Administered 2016-07-13: 40 mg via ORAL
  Filled 2016-07-13: qty 1

## 2016-07-13 MED ORDER — HEPARIN (PORCINE) IN NACL 100-0.45 UNIT/ML-% IJ SOLN
1250.0000 [IU]/h | INTRAMUSCULAR | Status: DC
  Administered 2016-07-13 – 2016-07-14 (×2): 1250 [IU]/h via INTRAVENOUS
  Filled 2016-07-13 (×2): qty 250

## 2016-07-13 MED ORDER — CLOPIDOGREL BISULFATE 75 MG PO TABS
75.0000 mg | ORAL_TABLET | Freq: Every day | ORAL | Status: DC
  Administered 2016-07-13: 75 mg via ORAL
  Filled 2016-07-13 (×2): qty 1

## 2016-07-13 NOTE — ED Triage Notes (Signed)
Onset 07/08/15 developed shortness of breath and since then constant shortness of breath and wakes up in the morning sweating past 3-4 days.  States denies chest pain or pressure.

## 2016-07-13 NOTE — [place] Notes (Signed)
ANTICOAGULATION CONSULT NOTE - Initial Consult  Pharmacy Consult for heparin Indication: chest pain/ACS  Allergies  Allergen Reactions  . Ciprofloxacin     GI upset  . Hydrochlorothiazide Other (See Comments)    dehydrates Dehydration  . Hydrocodone     Stomach upset Stomach upset  . Hydrocodone-Acetaminophen     Stomach upset  . Hydrocodone-Acetaminophen Nausea Only  . Sulfa Antibiotics Other (See Comments)    Cant recall  . Sulfacetamide Sodium Other (See Comments)    Cant recall  . Penicillins Hives and Rash    Has patient had a PCN reaction causing immediate rash, facial/tongue/throat swelling, SOB or lightheadedness with hypotension: YES Has patient had a PCN reaction causing severe rash involving mucus membranes or skin necrosis: NO Has patient had a PCN reaction that required hospitalization NO Has patient had a PCN reaction occurring within the last 10 years:NO If all of the above answers are "NO", then may proceed with Cephalosporin use.    Patient Measurements: Height: 5\' 9"  (175.3 cm) Weight: 243 lb (110.2 kg) IBW/kg (Calculated) : 70.7 Heparin Dosing Weight: 95kg  Vital Signs: Temp: 98.4 F (36.9 C) (02/13 0920) Temp Source: Oral (02/13 0920) BP: 150/56 (02/13 1230) Pulse Rate: 30 (02/13 1230)  Labs:  Recent Labs  07/13/16 0941  HGB 13.2  HCT 40.5  PLT 158  CREATININE 2.49*    Estimated Creatinine Clearance: 32.8 mL/min (by C-G formula based on SCr of 2.49 mg/dL (H)).   Medical History: Past Medical History:  Diagnosis Date  . Acute renal failure superimposed on stage 3 chronic kidney disease ([place]) 10/07/2015  . Arthritis    "hips; back" (12/13/2014)  . CAD (coronary artery disease) 2007   a. s/p CABG- IMA-LAD, VG-Cx, VG-RCA, VG-diag in 1999. B. sp redo CABG- VG-OM, VG-RCA in 2007 due to VG disease. c. NSTEMI 11/2014 s/p DES to SVG-OM from the Y graft.d. PTCA/DES x 1 distal body of SVG to Diagonal.09/2015  . CHF (congestive heart failure) ([place])    . Chronic lower back pain   . CKD (chronic kidney disease), stage IV (HCC)    Creatinine 1.9 to 2.2  . COPD (chronic obstructive pulmonary disease) ([place])   . Deafness in left ear   . Degenerative disc disease, lumbar   . Dilated cardiomyopathy ([place]) 10/07/2015  . Dysrhythmia    bigeminy pvcs  . Emphysema   . Esophageal stricture 07/02/1998   EGD  . GERD (gastroesophageal reflux disease)   . History of gout    "last flareup was in 2007" (12/13/2014)  . History of hiatal hernia   . Hyperlipidemia   . Hypertension   . Ischemic cardiomyopathy 2006   EF 40% to 50% by 2D echo in 2006;  Echo 12/31/12: Mild LVH, EF 50-55%, normal wall motion.   . Renal artery stenosis ([place])   . Type II diabetes mellitus (HCC)    Diet control   . Walking pneumonia 1990's    Assessment: 70 YOM here with SOB and sweating for the past 3-4 days along with intermittent chest pressure and left arm pain. History of CAD s/p CABG and stent placement in 11/2014.  Baseline Hgb 13.2, plts 158. Troponins negative.   Goal of Therapy:  Heparin level 0.3-0.7 units/ml Monitor platelets by anticoagulation protocol: Yes   Plan:  -heparin bolus with 4000 units IV x1, then start infusion at 1250 units/hr -heparin level in 8 hours due to age and borderline renal function -daily heparin level and CBC -follow s/s bleeding and  cardiology plans  Guilherme Schwenke D. Bernadetta Roell, PharmD, BCPS Clinical Pharmacist Pager: [phone] 07/13/2016 12:50 PM

## 2016-07-13 NOTE — [place] Notes (Signed)
[place] for heparin Indication: chest pain/ACS  Allergies  Allergen Reactions  . Ciprofloxacin     GI upset  . Hydrochlorothiazide Other (See Comments)    dehydrates Dehydration  . Hydrocodone     Stomach upset Stomach upset  . Hydrocodone-Acetaminophen     Stomach upset  . Hydrocodone-Acetaminophen Nausea Only  . Sulfa Antibiotics Other (See Comments)    Cant recall  . Sulfacetamide Sodium Other (See Comments)    Cant recall  . Penicillins Hives and Rash    Has patient had a PCN reaction causing immediate rash, facial/tongue/throat swelling, SOB or lightheadedness with hypotension: YES Has patient had a PCN reaction causing severe rash involving mucus membranes or skin necrosis: NO Has patient had a PCN reaction that required hospitalization NO Has patient had a PCN reaction occurring within the last 10 years:NO If all of the above answers are "NO", then may proceed with Cephalosporin use.    Patient Measurements: Height: 5\' 9"  (175.3 cm) Weight: 225 lb 3.2 oz (102.2 kg) IBW/kg (Calculated) : 70.7 Heparin Dosing Weight: 95kg  Vital Signs: Temp: 98.3 F (36.8 C) (02/13 2141) Temp Source: Oral (02/13 2141) BP: 149/70 (02/13 2141) Pulse Rate: 63 (02/13 2141)  Labs:  Recent Labs  07/13/16 0941 07/13/16 1826 07/13/16 2149  HGB 13.2  --   --   HCT 40.5  --   --   PLT 158  --   --   HEPARINUNFRC  --   --  0.52  CREATININE 2.49*  --   --   TROPONINI  --  <0.03  --     Estimated Creatinine Clearance: 31.6 mL/min (by C-G formula based on SCr of 2.49 mg/dL (H)).    Assessment: 74 YOM here with SOB and sweating for the past 3-4 days along with intermittent chest pressure and left arm pain. History of CAD s/p CABG and stent placement in 11/2014.  Baseline Hgb 13.2, plts 158. Troponins negative.  Initial heparin level therapeutic   Goal of Therapy:  Heparin level 0.3-0.7 units/ml Monitor platelets by anticoagulation  protocol: Yes   Plan:  Continue heparin at 1250 units/ hr Follow up AM labs  Thank you Anette Guarneri, PharmD [phone] -07/13/2016 10:37 PM

## 2016-07-13 NOTE — ED Notes (Signed)
Dinner tray ordered at this time.

## 2016-07-13 NOTE — ED Provider Notes (Signed)
[facility] DEPT Provider Note   CSN: [id number] Arrival date & time: 07/13/16  4287     History   Chief Complaint Chief Complaint  Patient presents with  . Shortness of Breath    HPI Ronald [name] is a 71 y.o. male.  HPI Patient presents the emergency department with intermittent chest pressure and shortness of breath as well as left arm pain over the past week.  He states that exertion seems to worsen his symptoms.  He does have a history of coronary artery disease status post CABG.  He also underwent heart catheterization and deployment of a drug-eluting stent in July 2016 which was followed by placement of a drug-eluting stent in May 2017.  He has had some mild right lateral leg pain.  No history DVT or pulmonary embolism.  No unilateral leg swelling.  No recent travel or surgery.  He has a history of chronic kidney disease with baseline creatinine of 1.9-2.2.   Past Medical History:  Diagnosis Date  . Acute renal failure superimposed on stage 3 chronic kidney disease ([place]) 10/07/2015  . Arthritis    "hips; back" (12/13/2014)  . CAD (coronary artery disease) 2007   a. s/p CABG- IMA-LAD, VG-Cx, VG-RCA, VG-diag in 1999. B. sp redo CABG- VG-OM, VG-RCA in 2007 due to VG disease. c. NSTEMI 11/2014 s/p DES to SVG-OM from the Y graft.d. PTCA/DES x 1 distal body of SVG to Diagonal.09/2015  . CHF (congestive heart failure) ([place])   . Chronic lower back pain   . CKD (chronic kidney disease), stage IV (HCC)    Creatinine 1.9 to 2.2  . COPD (chronic obstructive pulmonary disease) ([place])   . Deafness in left ear   . Degenerative disc disease, lumbar   . Dilated cardiomyopathy ([place]) 10/07/2015  . Dysrhythmia    bigeminy pvcs  . Emphysema   . Esophageal stricture 07/02/1998   EGD  . GERD (gastroesophageal reflux disease)   . History of gout    "last flareup was in 2007" (12/13/2014)  . History of hiatal hernia   . Hyperlipidemia   . Hypertension   . Ischemic cardiomyopathy 2006   EF 40%  to 50% by 2D echo in 2006;  Echo 12/31/12: Mild LVH, EF 50-55%, normal wall motion.   . Renal artery stenosis ([place])   . Type II diabetes mellitus (HCC)    Diet control   . Walking pneumonia 1990's    Patient Active Problem List   Diagnosis Date Noted  . Atherosclerosis of native arteries of extremity with intermittent claudication ([place]) 06/25/2016  . Acute renal failure superimposed on stage 3 chronic kidney disease ([place]) 10/07/2015  . Dilated cardiomyopathy ([place]) 10/07/2015  . Coronary artery disease involving native coronary artery of native heart with unstable angina pectoris ([place])   . Allergic rhinitis 08/19/2015  . NSTEMI (non-ST elevated myocardial infarction) ([place]) 12/13/2014  . Absolute anemia 10/04/2014  . AA (aortic aneurysm) ([place]) 10/04/2014  . Benign enlargement of prostate 10/04/2014  . Arteriosclerosis of coronary artery 10/04/2014  . Diabetes mellitus, type 2 ([place]) 10/04/2014  . Acid reflux 10/04/2014  . 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 HLD (hyperlipidemia) 10/04/2014  . BP (high blood pressure) 10/04/2014  . Osteoarthrosis 10/04/2014  . Adult BMI 30+ 10/04/2014  . 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 Chest pain, atypical 12/30/2012  . Urge incontinence of urine 10/24/2012  . 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 CKD (chronic kidney disease) stage  3, GFR 30-59 ml/min   . Abdominal pain 11/09/2010  . Shortness of breath 11/12/2009  . Renal artery stenosis    . Mixed hyperlipidemia 09/04/2008  . History of redo bypass grafting 09/04/2008  . Diabetes mellitus with nephropathy [facility])     Past Surgical History:  Procedure Laterality Date  . CARDIAC CATHETERIZATION  "several"  . CARDIAC CATHETERIZATION N/A 12/13/2014   Procedure: Left Heart Cath and Coronary Angiography;  Surgeon: Jettie Booze, MD;  Location: [facility];  Service: Cardiovascular;  Laterality: N/A;  .  CARDIAC CATHETERIZATION  12/13/2014   Procedure: Coronary Stent Intervention;  Surgeon: Jettie Booze, MD;  Location: [facility];  Service: Cardiovascular;;  . CARDIAC CATHETERIZATION N/A 10/07/2015   Procedure: Left Heart Cath and Cors/Grafts Angiography;  Surgeon: Burnell Blanks, MD;  Location: [place] [facility];  Service: Cardiovascular;  Laterality: N/A;  . CARDIAC CATHETERIZATION N/A 10/07/2015   Procedure: Coronary Stent Intervention;  Surgeon: Burnell Blanks, MD;  Location: [facility];  Service: Cardiovascular;  Laterality: N/A;  . CORONARY ANGIOPLASTY  "several"  . CORONARY ANGIOPLASTY WITH STENT PLACEMENT  2005; 12/13/2014   "2; 1"  . CORONARY ARTERY BYPASS GRAFT  1996   CABG X5  . CORONARY ARTERY BYPASS GRAFT  March 2007   CABG X3  . ESOPHAGOGASTRODUODENOSCOPY (EGD) WITH ESOPHAGEAL DILATION  2000  . GREEN LIGHT LASER TURP (TRANSURETHRAL RESECTION OF PROSTATE  2000's   "not cancerous"  . HERNIA REPAIR    . LAPAROSCOPIC CHOLECYSTECTOMY    . LUNG SURGERY  1996   "S/P CABG, had to put staple in lung after it had collapsed"  . UMBILICAL HERNIA REPAIR     w/chole       Home Medications    Prior to Admission medications   Medication Sig Start Date End Date Taking? Authorizing Provider  acetaminophen (TYLENOL) 650 MG CR tablet Take 1,300 mg by mouth daily as needed for pain.   Yes Historical Provider, MD  allopurinol (ZYLOPRIM) 100 MG tablet Take 1 tablet by mouth at  bedtime 07/21/15  Yes Richard Maceo Pro., MD  amLODipine [facility]) 5 MG tablet Take 1 tablet by mouth  daily 11/27/15  Yes [name]., MD  aspirin 81 MG tablet Take 81 mg by mouth every morning.    Yes Historical Provider, MD  atorvastatin (LIPITOR) 40 MG tablet Take 1 tablet (40 mg total) by mouth daily. 01/01/16  Yes Burnell Blanks, MD  carvedilol (COREG) 12.5 MG tablet Take 1 tablet (12.5 mg total) by mouth 2 (two) times daily with a meal. 04/30/16  Yes [name]., MD  clopidogrel (PLAVIX) 75 MG tablet Take 1 tablet by mouth  daily 12/30/15  Yes Burnell Blanks, MD  enalapril (VASOTEC) 2.5 MG tablet Take 2.5 mg by mouth daily.   Yes Historical Provider, MD  magnesium oxide (MAG-OX) 400 (241.3 Mg) MG tablet TAKE 1 TABLET BY MOUTH TWO TIMES DAILY. 02/03/16  Yes [name]., MD  Multiple Vitamin (MULTIVITAMIN) tablet Take 1 tablet by mouth every morning.    Yes Historical Provider, MD  NITROSTAT 0.4 MG SL tablet PLACE 1 TABLET UNDER TONGUE EVERY 5 MINUTES AS NEEDED UP TO 3 TIMES 04/20/16  Yes Burnell Blanks, MD  pantoprazole (PROTONIX) 40 MG tablet Take 1 tablet (40 mg total) by mouth daily. 10/08/15  Yes Arbutus Leas, NP  ranitidine (ZANTAC) 150 MG tablet Take 1 tablet (150  mg total) by mouth at bedtime. 11/27/15  Yes Richard Maceo Pro., MD  sodium bicarbonate 650 MG tablet Take 650 mg by mouth 2 (two) times daily.   Yes Historical Provider, MD  traMADol (ULTRAM) 50 MG tablet Take 50 mg by mouth daily as needed for pain. 11/03/15  Yes Historical Provider, MD    Family History Family History  Problem Relation Age of Onset  . Heart attack Mother     MI  . Stroke Mother   . Heart disease Mother   . Hypertension Mother   . Hyperlipidemia Mother   . Heart disease Father   . Rheumatic fever Father     Social History Social History  Substance Use Topics  . Smoking status: Former Smoker    Packs/day: 3.00    Years: 20.00    Types: Cigarettes    Quit date: 07/25/1986  . Smokeless tobacco: Never Used  . Alcohol use No     Allergies   Ciprofloxacin; Hydrochlorothiazide; Hydrocodone; Hydrocodone-acetaminophen; Hydrocodone-acetaminophen; Sulfa antibiotics; Sulfacetamide sodium; and Penicillins   Review of Systems Review of Systems  All other systems reviewed and are negative.    Physical Exam Updated Vital Signs BP 125/63   Pulse 62   Temp 98.4 F (36.9 C) (Oral)   Resp 18   Ht 5\' 9"  (1.753 m)   Wt 243 lb  (110.2 kg)   SpO2 97%   BMI 35.88 kg/m   Physical Exam  Constitutional: He is oriented to person, place, and time. He appears well-developed and well-nourished.  HENT:  Head: Normocephalic and atraumatic.  Eyes: EOM are normal.  Neck: Normal range of motion.  Cardiovascular: Normal rate, regular rhythm, normal heart sounds and intact distal pulses.   Pulmonary/Chest: Effort normal and breath sounds normal. No respiratory distress.  Abdominal: Soft. He exhibits no distension. There is no tenderness.  Musculoskeletal: Normal range of motion.  Neurological: He is alert and oriented to person, place, and time.  Skin: Skin is warm and dry.  Psychiatric: He has a normal mood and affect. Judgment normal.  Nursing note and vitals reviewed.    ED Treatments / Results  Labs (all labs ordered are listed, but only abnormal results are displayed) Labs Reviewed  BASIC METABOLIC PANEL - Abnormal; Notable for the following:       Result Value   CO2 21 (*)    Glucose, Bld 157 (*)    BUN 38 (*)    Creatinine, Ser 2.49 (*)    GFR calc non Af Amer 24 (*)    GFR calc Af Amer 28 (*)    All other components within normal limits  CBC  I-STAT TROPOININ, ED    EKG  EKG Interpretation  Date/Time:  Tuesday July 13 2016 09:18:52 EST Ventricular Rate:  69 PR Interval:  174 QRS Duration: 88 QT Interval:  370 QTC Calculation: 396 R Axis:   55 Text Interpretation:  Normal sinus rhythm Cannot rule out Anterior infarct , age undetermined Abnormal ECG No significant change was found Confirmed by Alexiz Sustaita  MD, Lennette Bihari ([id number]) on 07/13/2016 10:50:09 AM       Radiology Dg Chest 2 View  Result Date: 07/13/2016 CLINICAL DATA:  Midline chest pain, shortness of Breath, cough, hypertension EXAM: CHEST  2 VIEW COMPARISON:  12/13/2014 FINDINGS: Cardiomediastinal silhouette is stable. Status post CABG. No infiltrate or pulmonary edema. Stable chronic mild perihilar bronchitic changes. Prominent fat at in  right cardiophrenic angle is stable. Osteopenia and mild degenerative changes lower  thoracic spine. IMPRESSION: No active cardiopulmonary disease. Stable chronic mild perihilar bronchitic changes. Status post CABG. Osteopenia and mild degenerative changes thoracic spine. Electronically Signed   By: Lahoma Crocker M.D.   On: 07/13/2016 10:15    Procedures Procedures (including critical care time)      ++++++++++++++++++++++++++++++++++++++  CRITICAL CARE Performed by: Hoy Morn Total critical care time: 32 minutes Critical care time was exclusive of separately billable procedures and treating other patients. Critical care was necessary to treat or prevent imminent or life-threatening deterioration. Critical care was time spent personally by me on the following activities: development of treatment plan with patient and/or surrogate as well as nursing, discussions with consultants, evaluation of patient's response to treatment, examination of patient, obtaining history from patient or surrogate, ordering and performing treatments and interventions, ordering and review of laboratory studies, ordering and review of radiographic studies, pulse oximetry and re-evaluation of patient's condition.  ++++++++++++++++++++++++++++++++++++++++++++      Medications Ordered in ED Medications  aspirin tablet 325 mg (325 mg Oral Given 07/13/16 1255)  heparin bolus via infusion 4,000 Units (4,000 Units Intravenous Bolus from Bag 07/13/16 1501)     Initial Impression / Assessment and Plan / ED Course  I have reviewed the triage vital signs and the nursing notes.  Pertinent labs & imaging results that were available during my care of the patient were reviewed by me and considered in my medical decision making (see chart for details).     Hx concerning for unstable angina. ecg without acute ischemic abnormalities at this time. Admit to hospital. Heparin gtt and aspirin now. Cardiology consultation.  May benefit from heart cath this hospitalzation  Final Clinical Impressions(s) / ED Diagnoses   Final diagnoses:  Unstable angina [facility])    New Prescriptions New Prescriptions   No medications on file     Jola Schmidt, MD 07/16/16 1236

## 2016-07-13 NOTE — H&P (Signed)
History & Physical    Patient ID: Ronald [name] [facility] MRN: [id number], DOB/AGE: September 01, 1945   Admit date: 07/13/2016   Primary Physician: [name], MD Primary Cardiologist: Dr. Angelena Form  Patient Profile    71 yo male with PMH of CAD s.p CABG and subsq PCI, HTN, HL, COPD, CKD, and diastolic HF who presented with c/o exertional dyspnea over the past week.   Past Medical History    Past Medical History:  Diagnosis Date  . Acute renal failure superimposed on stage 3 chronic kidney disease ([place]) 10/07/2015  . Arthritis    "hips; back" (12/13/2014)  . CAD (coronary artery disease) 2007   a. s/p CABG- IMA-LAD, VG-Cx, VG-RCA, VG-diag in 1999. B. sp redo CABG- VG-OM, VG-RCA in 2007 due to VG disease. c. NSTEMI 11/2014 s/p DES to SVG-OM from the Y graft.d. PTCA/DES x 1 distal body of SVG to Diagonal.09/2015  . CHF (congestive heart failure) ([place])   . Chronic lower back pain   . CKD (chronic kidney disease), stage IV (HCC)    Creatinine 1.9 to 2.2  . COPD (chronic obstructive pulmonary disease) ([place])   . Deafness in left ear   . Degenerative disc disease, lumbar   . Dilated cardiomyopathy ([place]) 10/07/2015  . Dysrhythmia    bigeminy pvcs  . Emphysema   . Esophageal stricture 07/02/1998   EGD  . GERD (gastroesophageal reflux disease)   . History of gout    "last flareup was in 2007" (12/13/2014)  . History of hiatal hernia   . Hyperlipidemia   . Hypertension   . Ischemic cardiomyopathy 2006   EF 40% to 50% by 2D echo in 2006;  Echo 12/31/12: Mild LVH, EF 50-55%, normal wall motion.   . Renal artery stenosis ([place])   . Type II diabetes mellitus (HCC)    Diet control   . Walking pneumonia 1990's    Past Surgical History:  Procedure Laterality Date  . CARDIAC CATHETERIZATION  "several"  . CARDIAC CATHETERIZATION N/A 12/13/2014   Procedure: Left Heart Cath and Coronary Angiography;  Surgeon: Jettie Booze, MD;  Location: [place] [facility];  Service: Cardiovascular;  Laterality:  N/A;  . CARDIAC CATHETERIZATION  12/13/2014   Procedure: Coronary Stent Intervention;  Surgeon: Jettie Booze, MD;  Location: [place] [facility];  Service: Cardiovascular;;  . CARDIAC CATHETERIZATION N/A 10/07/2015   Procedure: Left Heart Cath and Cors/Grafts Angiography;  Surgeon: Burnell Blanks, MD;  Location: [place] [facility];  Service: Cardiovascular;  Laterality: N/A;  . CARDIAC CATHETERIZATION N/A 10/07/2015   Procedure: Coronary Stent Intervention;  Surgeon: Burnell Blanks, MD;  Location: [place] [facility];  Service: Cardiovascular;  Laterality: N/A;  . CORONARY ANGIOPLASTY  "several"  . CORONARY ANGIOPLASTY WITH STENT PLACEMENT  2005; 12/13/2014   "2; 1"  . CORONARY ARTERY BYPASS GRAFT  1996   CABG X5  . CORONARY ARTERY BYPASS GRAFT  March 2007   CABG X3  . ESOPHAGOGASTRODUODENOSCOPY (EGD) WITH ESOPHAGEAL DILATION  2000  . GREEN LIGHT LASER TURP (TRANSURETHRAL RESECTION OF PROSTATE  2000's   "not cancerous"  . HERNIA REPAIR    . LAPAROSCOPIC CHOLECYSTECTOMY    . LUNG SURGERY  1996   "S/P CABG, had to put staple in lung after it had collapsed"  . UMBILICAL HERNIA REPAIR     w/chole     Allergies  Allergies  Allergen Reactions  . Ciprofloxacin     GI upset  . Hydrochlorothiazide Other (See Comments)  dehydrates Dehydration  . Hydrocodone     Stomach upset Stomach upset  . Hydrocodone-Acetaminophen     Stomach upset  . Hydrocodone-Acetaminophen Nausea Only  . Sulfa Antibiotics Other (See Comments)    Cant recall  . Sulfacetamide Sodium Other (See Comments)    Cant recall  . Penicillins Hives and Rash    Has patient had a PCN reaction causing immediate rash, facial/tongue/throat swelling, SOB or lightheadedness with hypotension: YES Has patient had a PCN reaction causing severe rash involving mucus membranes or skin necrosis: NO Has patient had a PCN reaction that required hospitalization NO Has patient had a PCN reaction occurring within the  last 10 years:NO If all of the above answers are "NO", then may proceed with Cephalosporin use.    History of Present Illness    Mr. Orozco is a 71 yo male with PMH of CAD s.p CABG and subsq PCI, HTN, HL, COPD, CKD, and diastolic HF. He originally underwent CABG in 1966 with (LIMA-LAD, SVG-circumflex, SVG-RCA, SVG-diagonal) and then redo CABG in 2007 with (SVG-OM, SVG-RCA). He was followed by Dr. Lia Foyer until he retired. Underwent Lexiscan myoview 8/14 noting no ischemia with EF 53%, and echo during that time showed low normal EF with no WMA. He was hospitalized in 7/16 with an NSTEMI. Cath at that time noted severe disease in the SVG to OM that was treated with 3.5 x 12 mm Synergy DES. Placed on DAPT with ASA and Brilinta but could not tolerate due to dyspnea and changed to plavix. Presented in 5/17 and again underwent cath noting severe disease in the distal body/anastomosis of SVG to Diag that was treated with DES. Had continued patency to the LIMA-LAD, SVG-OM, and SVG to PDA/PLA.   He was last seen in the office 8/17 and reported doing well. He was continued on his home medications without change.   States he has been doing well until last Tuesday evening. He was awoken from sleep with left sided chest discomfort with radiation into the left arm. Also had a burning sensation in his stomach and throat. Symptoms subsided without intervention. Wednesday afternoon he continued to have GI upset and felt "winded and drained" with minimal activity, and this continued into Thursday. Over the weekend he felt ok, and didn't have any further episodes of chest pain. Developed diaphoresis associated with chest discomfort Sunday afternoon. Last night he was at the grocery store and had no energy to walk around the store. Became concerned as these symptoms are very similar to those he has experienced with previous cardiac events, though previously has significant chest pain. Also has noted some weight gain, but  states he has not been keep to diet. Also has missed doses of plavix intermittently.   Labs on admission showed stable electrolytes, Cr 2.4, Trop neg x1, Hgb 13.2. CXR neg. EKG shows SB with Bigeminy. He was started on IV heparin in the ED.  Home Medications    Prior to Admission medications   Medication Sig Start Date End Date Taking? Authorizing Provider  acetaminophen (TYLENOL) 650 MG CR tablet Take 1,300 mg by mouth daily as needed for pain.   Yes Historical Provider, MD  allopurinol (ZYLOPRIM) 100 MG tablet Take 1 tablet by mouth at  bedtime 07/21/15  Yes Richard Maceo Pro., MD  amLODipine [facility]) 5 MG tablet Take 1 tablet by mouth  daily 11/27/15  Yes [name]., MD  aspirin 81 MG tablet Take 81 mg by mouth every morning.  Yes Historical Provider, MD  atorvastatin (LIPITOR) 40 MG tablet Take 1 tablet (40 mg total) by mouth daily. 01/01/16  Yes Burnell Blanks, MD  carvedilol (COREG) 12.5 MG tablet Take 1 tablet (12.5 mg total) by mouth 2 (two) times daily with a meal. 04/30/16  Yes [name]., MD  clopidogrel (PLAVIX) 75 MG tablet Take 1 tablet by mouth  daily 12/30/15  Yes Burnell Blanks, MD  enalapril (VASOTEC) 2.5 MG tablet Take 2.5 mg by mouth daily.   Yes Historical Provider, MD  magnesium oxide (MAG-OX) 400 (241.3 Mg) MG tablet TAKE 1 TABLET BY MOUTH TWO TIMES DAILY. 02/03/16  Yes [name]., MD  Multiple Vitamin (MULTIVITAMIN) tablet Take 1 tablet by mouth every morning.    Yes Historical Provider, MD  NITROSTAT 0.4 MG SL tablet PLACE 1 TABLET UNDER TONGUE EVERY 5 MINUTES AS NEEDED UP TO 3 TIMES 04/20/16  Yes Burnell Blanks, MD  pantoprazole (PROTONIX) 40 MG tablet Take 1 tablet (40 mg total) by mouth daily. 10/08/15  Yes Arbutus Leas, NP  ranitidine (ZANTAC) 150 MG tablet Take 1 tablet (150 mg total) by mouth at bedtime. 11/27/15  Yes Richard Maceo Pro., MD  sodium bicarbonate 650 MG tablet Take 650 mg by mouth 2 (two) times  daily.   Yes Historical Provider, MD  traMADol (ULTRAM) 50 MG tablet Take 50 mg by mouth daily as needed for pain. 11/03/15  Yes Historical Provider, MD    Family History    Family History  Problem Relation Age of Onset  . Heart attack Mother     MI  . Stroke Mother   . Heart disease Mother   . Hypertension Mother   . Hyperlipidemia Mother   . Heart disease Father   . Rheumatic fever Father     Social History    Social History   Social History  . Marital status: Married    Spouse name: N/A  . Number of children: 2  . Years of education: N/A   Occupational History  . Retired    Social History Main Topics  . Smoking status: Former Smoker    Packs/day: 3.00    Years: 20.00    Types: Cigarettes    Quit date: 07/25/1986  . Smokeless tobacco: Never Used  . Alcohol use No  . Drug use: No  . Sexual activity: Not Currently   Other Topics Concern  . 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     Review of Systems    General:  No chills, fever, night sweats ++ weight changes.  Cardiovascular: See HPI Dermatological: No rash, lesions/masses Respiratory: No cough, dyspnea Urologic: No hematuria, dysuria Abdominal:   No nausea, vomiting, diarrhea, bright red blood per rectum, melena, or hematemesis Neurologic:  No visual changes, wkns, changes in mental status. All other systems reviewed and are otherwise negative except as noted above.  Physical Exam    Blood pressure 140/55, pulse (!) 30, temperature 98.4 F (36.9 C), temperature source Oral, resp. rate 20, height 5\' 9"  (1.753 m), weight 243 lb (110.2 kg), SpO2 99 %.  General: Pleasant older obese WM, NAD Psych: Normal affect. Neuro: Alert and oriented X 3. Moves all extremities spontaneously. HEENT: Normal  Neck: Supple without bruits or JVD. Lungs:  Resp regular and unlabored, CTA. Heart: RRR no s3, s4, or murmurs. Abdomen: Soft, obese, non-tender, non-distended, BS + x 4.  Extremities:  No clubbing,  cyanosis or edema. DP/PT/Radials 2+ and equal bilaterally.  Labs    Troponin [facility] of Care Test)  Recent Labs  07/13/16 0952  TROPIPOC 0.00   No results for input(s): CKTOTAL, CKMB, TROPONINI in the last 72 hours. Lab Results  Component Value Date   WBC 7.3 07/13/2016   HGB 13.2 07/13/2016   HCT 40.5 07/13/2016   MCV 88.0 07/13/2016   PLT 158 07/13/2016    Recent Labs Lab 07/13/16 0941  NA 140  K 4.7  CL 109  CO2 21*  BUN 38*  CREATININE 2.49*  CALCIUM 9.4  GLUCOSE 157*   Lab Results  Component Value Date   CHOL 112 11/04/2015   HDL 32 (L) 11/04/2015   LDLCALC 58 11/04/2015   TRIG 112 11/04/2015   Lab Results  Component Value Date   DDIMER  08/20/2008    0.29        AT THE INHOUSE ESTABLISHED CUTOFF VALUE OF 0.48 ug/mL FEU, THIS ASSAY HAS BEEN DOCUMENTED IN THE LITERATURE TO HAVE A SENSITIVITY AND NEGATIVE PREDICTIVE VALUE OF AT LEAST 98 TO 99%.  THE TEST RESULT SHOULD BE CORRELATED WITH AN ASSESSMENT OF THE CLINICAL PROBABILITY OF DVT / VTE.     Radiology Studies    Dg Chest 2 View  Result Date: 07/13/2016 CLINICAL DATA:  Midline chest pain, shortness of Breath, cough, hypertension EXAM: CHEST  2 VIEW COMPARISON:  12/13/2014 FINDINGS: Cardiomediastinal silhouette is stable. Status post CABG. No infiltrate or pulmonary edema. Stable chronic mild perihilar bronchitic changes. Prominent fat at in right cardiophrenic angle is stable. Osteopenia and mild degenerative changes lower thoracic spine. IMPRESSION: No active cardiopulmonary disease. Stable chronic mild perihilar bronchitic changes. Status post CABG. Osteopenia and mild degenerative changes thoracic spine. Electronically Signed   By: Lahoma Crocker M.D.   On: 07/13/2016 10:15    ECG & Cardiac Imaging    EKG: SB with Bigeminy   Echo: 8/14  Study Conclusions  Left ventricle: The cavity size was mildly dilated. Wall thickness was increased in a pattern of mild LVH. Systolic function  was normal. The estimated ejection fraction was in the range of 50% to 55%. Wall motion was normal; there were no regional wall motion abnormalities.  Assessment & Plan    71 yo male with PMH of CAD s.p CABG and subsq PCI, HTN, HL, COPD, CKD, and diastolic HF who presented with c/o exertional dyspnea over the past week.   1. Exertional Dyspnea: states this has been progressive over the past week. Also with lack of energy, and intermittent chest pain at times. States this is very similar to previous cardiac episodes he's had. Also had some GI upset which he reports happens every time he "needs a stent". Has been cathed in 2016 and 2017 with DES placed both times. Was on DAPT with ASA/Brilinta but changed to plavix. States he has not been complaint with diet and misses doses of plavix at times.  -- Trop neg x1, EKG shows SB with Bigeminy -- placed on IV heparin in the ED.  -- cycle trop, follow trend to determine whether repeat cath or medical management. -- check echo  2. CAD s/p CABG x2 with subsq PCI: Has been doing well until last Tuesday when he developed symptoms similar to those experienced with previous cardiac events.  -- continue BB, ASA and Plavix  3. HTN: Continue BB, will hold ACEi given elevation in Cr  4. HL: On statin  5. CKD III/IV: Cr 2.4, appears Cr  was 1.6-1.8 early last year, but has been 2.4 since last cath. Will give gentle IV hydration, follow up BMET in the morning.   6. GERD: reports burning sensation in his abd, throat for the past couple of days. Question whether symptoms could be GI related? Will continue PPI.    Barnet Pall, NP-C Pager [phone] 07/13/2016, 2:43 PM   I have examined the patient and reviewed assessment and plan and discussed with patient.  Agree with above as stated.  Sx with some features concerning for angina.  Some atypical features.  COntinue heparin.  IV hydration.  If troponin increased, would have to consider cath  tomorrow.  If negative, consider adjusting medical therapy to avoid the risks to kidneys with cath.  Larae Grooms

## 2016-07-14 ENCOUNTER — Inpatient Hospital Stay (HOSPITAL_COMMUNITY): Payer: Medicare Other

## 2016-07-14 ENCOUNTER — Telehealth: Payer: Self-pay

## 2016-07-14 DIAGNOSIS — E1121 Type 2 diabetes mellitus with diabetic nephropathy: Secondary | ICD-10-CM

## 2016-07-14 DIAGNOSIS — I701 Atherosclerosis of renal artery: Secondary | ICD-10-CM | POA: Diagnosis not present

## 2016-07-14 DIAGNOSIS — Z79899 Other long term (current) drug therapy: Secondary | ICD-10-CM | POA: Diagnosis not present

## 2016-07-14 DIAGNOSIS — E785 Hyperlipidemia, unspecified: Secondary | ICD-10-CM | POA: Diagnosis not present

## 2016-07-14 DIAGNOSIS — N183 Chronic kidney disease, stage 3 (moderate): Secondary | ICD-10-CM | POA: Diagnosis not present

## 2016-07-14 DIAGNOSIS — I420 Dilated cardiomyopathy: Secondary | ICD-10-CM | POA: Diagnosis not present

## 2016-07-14 DIAGNOSIS — R0600 Dyspnea, unspecified: Secondary | ICD-10-CM

## 2016-07-14 DIAGNOSIS — E1122 Type 2 diabetes mellitus with diabetic chronic kidney disease: Secondary | ICD-10-CM | POA: Diagnosis not present

## 2016-07-14 DIAGNOSIS — I10 Essential (primary) hypertension: Secondary | ICD-10-CM

## 2016-07-14 DIAGNOSIS — R0602 Shortness of breath: Secondary | ICD-10-CM | POA: Diagnosis not present

## 2016-07-14 DIAGNOSIS — M1A9XX1 Chronic gout, unspecified, with tophus (tophi): Secondary | ICD-10-CM | POA: Diagnosis not present

## 2016-07-14 DIAGNOSIS — N184 Chronic kidney disease, stage 4 (severe): Secondary | ICD-10-CM | POA: Diagnosis not present

## 2016-07-14 DIAGNOSIS — K219 Gastro-esophageal reflux disease without esophagitis: Secondary | ICD-10-CM | POA: Diagnosis not present

## 2016-07-14 DIAGNOSIS — Z955 Presence of coronary angioplasty implant and graft: Secondary | ICD-10-CM | POA: Diagnosis not present

## 2016-07-14 DIAGNOSIS — Z87891 Personal history of nicotine dependence: Secondary | ICD-10-CM | POA: Diagnosis not present

## 2016-07-14 DIAGNOSIS — I5032 Chronic diastolic (congestive) heart failure: Secondary | ICD-10-CM

## 2016-07-14 DIAGNOSIS — J449 Chronic obstructive pulmonary disease, unspecified: Secondary | ICD-10-CM | POA: Diagnosis not present

## 2016-07-14 DIAGNOSIS — I509 Heart failure, unspecified: Secondary | ICD-10-CM | POA: Diagnosis not present

## 2016-07-14 DIAGNOSIS — I25110 Atherosclerotic heart disease of native coronary artery with unstable angina pectoris: Secondary | ICD-10-CM | POA: Diagnosis not present

## 2016-07-14 DIAGNOSIS — Z7982 Long term (current) use of aspirin: Secondary | ICD-10-CM | POA: Diagnosis not present

## 2016-07-14 DIAGNOSIS — I252 Old myocardial infarction: Secondary | ICD-10-CM | POA: Diagnosis not present

## 2016-07-14 DIAGNOSIS — Z951 Presence of aortocoronary bypass graft: Secondary | ICD-10-CM | POA: Diagnosis not present

## 2016-07-14 DIAGNOSIS — I130 Hypertensive heart and chronic kidney disease with heart failure and stage 1 through stage 4 chronic kidney disease, or unspecified chronic kidney disease: Secondary | ICD-10-CM | POA: Diagnosis not present

## 2016-07-14 LAB — CBC
HEMATOCRIT: 37.1 % — AB (ref 39.0–52.0)
Hemoglobin: 12.6 g/dL — ABNORMAL LOW (ref 13.0–17.0)
MCH: 29.5 pg (ref 26.0–34.0)
MCHC: 34 g/dL (ref 30.0–36.0)
MCV: 86.9 fL (ref 78.0–100.0)
PLATELETS: 146 10*3/uL — AB (ref 150–400)
RBC: 4.27 MIL/uL (ref 4.22–5.81)
RDW: 14.3 % (ref 11.5–15.5)
WBC: 7.3 10*3/uL (ref 4.0–10.5)

## 2016-07-14 LAB — BASIC METABOLIC PANEL
Anion gap: 9 (ref 5–15)
BUN: 34 mg/dL — ABNORMAL HIGH (ref 6–20)
CHLORIDE: 110 mmol/L (ref 101–111)
CO2: 23 mmol/L (ref 22–32)
CREATININE: 2.05 mg/dL — AB (ref 0.61–1.24)
Calcium: 8.9 mg/dL (ref 8.9–10.3)
GFR, EST AFRICAN AMERICAN: 36 mL/min — AB (ref >60)
GFR, EST NON AFRICAN AMERICAN: 31 mL/min — AB (ref >60)
Glucose, Bld: 106 mg/dL — ABNORMAL HIGH (ref 65–99)
Potassium: 4.6 mmol/L (ref 3.5–5.1)
Sodium: 142 mmol/L (ref 135–145)

## 2016-07-14 LAB — TROPONIN I: Troponin I: <0.03 ng/mL (ref <0.03)

## 2016-07-14 LAB — ECHOCARDIOGRAM COMPLETE
HEIGHTINCHES: 69 in
WEIGHTICAEL: 3622.4 [oz_av]

## 2016-07-14 LAB — HEPARIN LEVEL (UNFRACTIONATED): HEPARIN UNFRACTIONATED: 0.44 [IU]/mL (ref 0.30–0.70)

## 2016-07-14 NOTE — Progress Notes (Signed)
  Echocardiogram 2D Echocardiogram has been performed.  Ronald [name] 07/14/2016, 9:59 AM

## 2016-07-14 NOTE — Discharge Summary (Signed)
Discharge Summary    Patient ID: Ronald [name] [facility],  MRN: [id number], DOB/AGE: 08-02-1945 71 y.o.  Admit date: 07/13/2016 Discharge date: 07/14/2016  Primary Care Provider: [name] Primary Cardiologist: Dr. Angelena Form  Discharge Diagnoses    Principal Problem:   Dyspnea, + diastolic dysfunction, but pt improved without diuresis, may have been angina, but no MI. Active Problems:   Diabetes mellitus with nephropathy (HCC)   CKD (chronic kidney disease) stage 3, GFR 30-59 ml/min   HLD (hyperlipidemia)   BP (high blood pressure)   Coronary artery disease involving native coronary artery of native heart with unstable angina pectoris (HCC)   Allergies Allergies  Allergen Reactions  . Ciprofloxacin     GI upset  . Hydrochlorothiazide Other (See Comments)    dehydrates Dehydration  . Hydrocodone     Stomach upset Stomach upset  . Hydrocodone-Acetaminophen     Stomach upset  . Hydrocodone-Acetaminophen Nausea Only  . Sulfa Antibiotics Other (See Comments)    Cant recall  . Sulfacetamide Sodium Other (See Comments)    Cant recall  . Penicillins Hives and Rash    Has patient had a PCN reaction causing immediate rash, facial/tongue/throat swelling, SOB or lightheadedness with hypotension: YES Has patient had a PCN reaction causing severe rash involving mucus membranes or skin necrosis: NO Has patient had a PCN reaction that required hospitalization NO Has patient had a PCN reaction occurring within the last 10 years:NO If all of the above answers are "NO", then may proceed with Cephalosporin use.    Diagnostic Studies/Procedures   ECHO 07/14/16  Study Conclusions  - Left ventricle: The cavity size was normal. There was mild   concentric hypertrophy. Systolic function was mildly reduced. The   estimated ejection fraction was in the range of 45% to 50%. Mild   diffuse hypokinesis. Doppler parameters are consistent with   abnormal left ventricular relaxation (grade  1 diastolic   dysfunction). Doppler parameters are consistent with   indeterminate ventricular filling pressure. - Aortic valve: Transvalvular velocity was within the normal range.   There was no stenosis. There was no regurgitation. - Mitral valve: Transvalvular velocity was within the normal range.   There was no evidence for stenosis. There was mild regurgitation. - Left atrium: The atrium was mildly dilated. - Right ventricle: The cavity size was normal. Wall thickness was   normal. Systolic function was normal. - Atrial septum: No defect or patent foramen ovale was identified   by color flow Doppler. - Tricuspid valve: There was mild regurgitation. - Pulmonary arteries: Systolic pressure was within the normal   range. PA peak pressure: 27 mm Hg (S). ___________   History of Present Illness     71 yo male with PMH of CAD s.p CABG and subsq PCI, HTN, HL, COPD, CKD, and diastolic HF. He originally underwent CABG in 1966 with (LIMA-LAD, SVG-circumflex, SVG-RCA, SVG-diagonal) and then redo CABG in 2007 with (SVG-OM, SVG-RCA). He was followed by Dr. Lia Foyer until he retired. Underwent Lexiscan myoview 8/14 noting no ischemia with EF 53%, and echo during that time showed low normal EF with no WMA. He was hospitalized in 7/16 with an NSTEMI. Cath at that time noted severe disease in the SVG to OM that was treated with 3.5 x 12 mm Synergy DES. Placed on DAPT with ASA and Brilinta but could not tolerate due to dyspnea and changed to plavix. Presented in 5/17 and again underwent cath noting severe disease in the distal body/anastomosis  of SVG to Diag that was treated with DES. Had continued patency to the LIMA-LAD, SVG-OM, and SVG to PDA/PLA.   Now admitted 07/13/16 with complaints of of chest discomfort wit radiation to lt arm.  Also burning in stomach and throat last week.  These resolved but would come and go.  He also felt winded and drained wit minimal activity.  He continued to have weakness  episodes and was concerned this was cardiac.  He had similar symptoms with previous cardiac events.  He was placed on IV heparin and admitted to rule out MI and eval. Echo for LV dysfunction.     Hospital Course     Consultants: none  Pt has done well and no further symptoms by today.  No fevers, neg troponin,  He has had PVCs on tele which caused HR to appear to be 30.  IV heparin stopped and pt ambulated without problems.  Echo was stable.  Dyspnea may have been related to angina.  He was seen and evaluated by Dr. Irish Lack and found stable for discharge.  He will follow up in 2 weeks in the office.    _____________  Discharge Vitals Blood pressure 128/60, pulse 70, temperature 98.1 F (36.7 C), temperature source Oral, resp. rate 20, height 5\' 9"  (1.753 m), weight 226 lb 6.4 oz (102.7 kg), SpO2 99 %.  Filed Weights   07/13/16 0924 07/13/16 1832 07/14/16 0524  Weight: 243 lb (110.2 kg) 225 lb 3.2 oz (102.2 kg) 226 lb 6.4 oz (102.7 kg)    Labs & Radiologic Studies    CBC  Recent Labs  07/13/16 0941 07/14/16 0605  WBC 7.3 7.3  HGB 13.2 12.6*  HCT 40.5 37.1*  MCV 88.0 86.9  PLT 158 283*   Basic Metabolic Panel  Recent Labs  07/13/16 0941 07/14/16 0605  NA 140 142  K 4.7 4.6  CL 109 110  CO2 21* 23  GLUCOSE 157* 106*  BUN 38* 34*  CREATININE 2.49* 2.05*  CALCIUM 9.4 8.9   Liver Function Tests No results for input(s): AST, ALT, ALKPHOS, BILITOT, PROT, ALBUMIN in the last 72 hours. No results for input(s): LIPASE, AMYLASE in the last 72 hours. Cardiac Enzymes  Recent Labs  07/13/16 1826 07/14/16 0023 07/14/16 0605  TROPONINI <0.03 <0.03 <0.03   BNP Invalid input(s): POCBNP D-Dimer No results for input(s): DDIMER in the last 72 hours. Hemoglobin A1C No results for input(s): HGBA1C in the last 72 hours. Fasting Lipid Panel No results for input(s): CHOL, HDL, LDLCALC, TRIG, CHOLHDL, LDLDIRECT in the last 72 hours. Thyroid Function Tests No results for  input(s): TSH, T4TOTAL, T3FREE, THYROIDAB in the last 72 hours.  Invalid input(s): FREET3 _____________  CHEST  2 VIEW  COMPARISON:  12/13/2014  FINDINGS: Cardiomediastinal silhouette is stable. Status post CABG. No infiltrate or pulmonary edema. Stable chronic mild perihilar bronchitic changes. Prominent fat at in right cardiophrenic angle is stable. Osteopenia and mild degenerative changes lower thoracic spine.  IMPRESSION: No active cardiopulmonary disease. Stable chronic mild perihilar bronchitic changes. Status post CABG. Osteopenia and mild degenerative changes thoracic spine.  Disposition   Pt is being discharged home today in good condition.  Follow-up Plans & Appointments   Heart Healthy diet  Keep follow up appointment   Call for further problems or questions.       Follow-up Information    Lauree Chandler, MD Follow up on 07/29/2016.   Specialty:  Cardiology Why:  at 3 pm with PA Ellen Henri.  Contact information: [address] [place] [address] [phone]            Discharge Medications   Current Discharge Medication List    CONTINUE these medications which have NOT CHANGED   Details  acetaminophen (TYLENOL) 650 MG CR tablet Take 1,300 mg by mouth daily as needed for pain.    allopurinol (ZYLOPRIM) 100 MG tablet Take 1 tablet by mouth at  bedtime Qty: 90 tablet, Refills: 3    amLODipine (NORVASC) 5 MG tablet Take 1 tablet by mouth  daily Qty: 90 tablet, Refills: 3    aspirin 81 MG tablet Take 81 mg by mouth every morning.     atorvastatin (LIPITOR) 40 MG tablet Take 1 tablet (40 mg total) by mouth daily. Qty: 90 tablet, Refills: 3    carvedilol (COREG) 12.5 MG tablet Take 1 tablet (12.5 mg total) by mouth 2 (two) times daily with a meal. Qty: 180 tablet, Refills: 3    clopidogrel (PLAVIX) 75 MG tablet Take 1 tablet by mouth  daily Qty: 90 tablet, Refills: 3   Associated Diagnoses: Coronary artery  disease involving coronary bypass graft of native heart with unstable angina pectoris (HCC)    enalapril (VASOTEC) 2.5 MG tablet Take 2.5 mg by mouth daily.    magnesium oxide (MAG-OX) 400 (241.3 Mg) MG tablet TAKE 1 TABLET BY MOUTH TWO TIMES DAILY. Qty: 180 tablet, Refills: 3    Multiple Vitamin (MULTIVITAMIN) tablet Take 1 tablet by mouth every morning.     NITROSTAT 0.4 MG SL tablet PLACE 1 TABLET UNDER TONGUE EVERY 5 MINUTES AS NEEDED UP TO 3 TIMES Qty: 25 tablet, Refills: 3    pantoprazole (PROTONIX) 40 MG tablet Take 1 tablet (40 mg total) by mouth daily. Qty: 30 tablet, Refills: 12    ranitidine (ZANTAC) 150 MG tablet Take 1 tablet (150 mg total) by mouth at bedtime. Qty: 90 tablet, Refills: 3   Associated Diagnoses: Left upper quadrant pain    sodium bicarbonate 650 MG tablet Take 650 mg by mouth 2 (two) times daily.   Associated Diagnoses: Coronary artery disease involving coronary bypass graft of native heart with unstable angina pectoris (HCC)    traMADol (ULTRAM) 50 MG tablet Take 50 mg by mouth daily as needed for pain.            Outstanding Labs/Studies   none  Duration of Discharge Encounter   Greater than 30 minutes including physician time.  Signed, Cecilie Kicks NP 07/14/2016, 1:18 PM   I have examined the patient and reviewed assessment and plan and discussed with patient.  Agree with above as stated.  Ruled out for MI.  Discussed cath but will hold off given CRI.  No CP with walking in the halls.  COntinue medical therapy.  He is in agreement.  Continue current BP lowering meds and lipid lowering therapy and antianginal therapy.    Larae Grooms

## 2016-07-14 NOTE — Progress Notes (Addendum)
Progress Note  Patient Name: Ronald [name] [facility] Date of Encounter: 07/14/2016  Primary Cardiologist: Angelena Form  Subjective   Feels well.  Wants to go home.  No CP.   Inpatient Medications    Scheduled Meds: . allopurinol  100 mg Oral QHS  . amLODipine  5 mg Oral Daily  . aspirin EC  81 mg Oral Daily  . atorvastatin  40 mg Oral q1800  . carvedilol  12.5 mg Oral BID WC  . clopidogrel  75 mg Oral Daily  . magnesium oxide  400 mg Oral BID  . pantoprazole  40 mg Oral Daily  . sodium bicarbonate  650 mg Oral BID   Continuous Infusions: . heparin 1,250 Units/hr (07/14/16 0357)   PRN Meds: acetaminophen, nitroGLYCERIN, ondansetron (ZOFRAN) IV   Vital Signs    Vitals:   07/13/16 2141 07/14/16 0022 07/14/16 0524 07/14/16 1100  BP: (!) 149/70 (!) 123/55 (!) 119/57 (!) 147/67  Pulse: 63 67 69 60  Resp: 20 20 20    Temp: 98.3 F (36.8 C) 98.1 F (36.7 C) 98 F (36.7 C)   TempSrc: Oral Oral Oral   SpO2: 97% 94% 99%   Weight:   226 lb 6.4 oz (102.7 kg)   Height:        Intake/Output Summary (Last 24 hours) at 07/14/16 1125 Last data filed at 07/14/16 0900  Gross per 24 hour  Intake           334.17 ml  Output              650 ml  Net          -315.83 ml   Filed Weights   07/13/16 0924 07/13/16 1832 07/14/16 0524  Weight: 243 lb (110.2 kg) 225 lb 3.2 oz (102.2 kg) 226 lb 6.4 oz (102.7 kg)    Telemetry    NSR, PVCs - Personally Reviewed  ECG    NSR, PVCs, NO ST changes - Personally Reviewed  Physical Exam   GEN: No acute distress.   Neck: No JVD Cardiac: RRR, no murmurs, rubs, or gallops.  Respiratory: Clear to auscultation bilaterally. GI: Soft, nontender, non-distended  MS: No edema; No deformity. Neuro:  Nonfocal  Psych: Normal affect   Labs    Chemistry Recent Labs Lab 07/13/16 0941 07/14/16 0605  NA 140 142  K 4.7 4.6  CL 109 110  CO2 21* 23  GLUCOSE 157* 106*  BUN 38* 34*  CREATININE 2.49* 2.05*  CALCIUM 9.4 8.9  GFRNONAA 24* 31*  GFRAA  28* 36*  ANIONGAP 10 9     Hematology Recent Labs Lab 07/13/16 0941 07/14/16 0605  WBC 7.3 7.3  RBC 4.60 4.27  HGB 13.2 12.6*  HCT 40.5 37.1*  MCV 88.0 86.9  MCH 28.7 29.5  MCHC 32.6 34.0  RDW 14.0 14.3  PLT 158 146*    Cardiac Enzymes Recent Labs Lab 07/13/16 1826 07/14/16 0023 07/14/16 0605  TROPONINI <0.03 <0.03 <0.03    Recent Labs Lab 07/13/16 0952  TROPIPOC 0.00     BNPNo results for input(s): BNP, PROBNP in the last 168 hours.   DDimer No results for input(s): DDIMER in the last 168 hours.   Radiology    Dg Chest 2 View  Result Date: 07/13/2016 CLINICAL DATA:  Midline chest pain, shortness of Breath, cough, hypertension EXAM: CHEST  2 VIEW COMPARISON:  12/13/2014 FINDINGS: Cardiomediastinal silhouette is stable. Status post CABG. No infiltrate or pulmonary edema. Stable chronic mild perihilar bronchitic changes.  Prominent fat at in right cardiophrenic angle is stable. Osteopenia and mild degenerative changes lower thoracic spine. IMPRESSION: No active cardiopulmonary disease. Stable chronic mild perihilar bronchitic changes. Status post CABG. Osteopenia and mild degenerative changes thoracic spine. Electronically Signed   By: Lahoma Crocker M.D.   On: 07/13/2016 10:15    Cardiac Studies   Echocardiogram 2018 Study Conclusions  - Left ventricle: The cavity size was normal. There was mild   concentric hypertrophy. Systolic function was mildly reduced. The   estimated ejection fraction was in the range of 45% to 50%. Mild   diffuse hypokinesis. Doppler parameters are consistent with   abnormal left ventricular relaxation (grade 1 diastolic   dysfunction). Doppler parameters are consistent with   indeterminate ventricular filling pressure. - Aortic valve: Transvalvular velocity was within the normal range.   There was no stenosis. There was no regurgitation. - Mitral valve: Transvalvular velocity was within the normal range.   There was no evidence for  stenosis. There was mild regurgitation. - Left atrium: The atrium was mildly dilated. - Right ventricle: The cavity size was normal. Wall thickness was   normal. Systolic function was normal. - Atrial septum: No defect or patent foramen ovale was identified   by color flow Doppler. - Tricuspid valve: There was mild regurgitation. - Pulmonary arteries: Systolic pressure was within the normal   range. PA peak pressure: 27 mm Hg (S).   Patient Profile     71 yo male with PMH of CAD s.p CABG and subsq PCI, HTN, HL, COPD, CKD, and diastolic HF who presented with c/o exertional dyspnea over the past week.   Assessment & Plan    1) Ruled out for MI.  Would not pursue cath given CRI.  COntinue medical therapy.  Patient and wife are in agreement.  Plan discharge later today.  Signed, Jettie Booze, MD  07/14/2016, 11:25 AM

## 2016-07-14 NOTE — Progress Notes (Signed)
Echo is at bedside

## 2016-07-14 NOTE — Progress Notes (Signed)
Call placed to C[facility] to notify of telemetry monitoring d/c.   

## 2016-07-14 NOTE — Telephone Encounter (Signed)
FYI, waiting on the forms-aa

## 2016-07-14 NOTE — Progress Notes (Signed)
Patient's medications were administered at 2030 last night. Due to this, pt received asa this morning, but other meds were held, pending further plan re possible procedure/discharge.   Pt and wife agree to give meds at home, as it has been too soon to readminister here at this time.

## 2016-07-14 NOTE — Progress Notes (Signed)
Patient discharged home prior to administrating Observation / Code 48 letter; Ronald [name] [phone]

## 2016-07-14 NOTE — Discharge Instructions (Signed)
Heart Healthy diet  Keep follow up appointment   Call for further problems or questions.

## 2016-07-14 NOTE — Progress Notes (Signed)
ANTICOAGULATION CONSULT NOTE - Follow Up Consult  Pharmacy Consult for Heparin Indication: chest pain/ACS  Allergies  Allergen Reactions  . Ciprofloxacin     GI upset  . Hydrochlorothiazide Other (See Comments)    dehydrates Dehydration  . Hydrocodone     Stomach upset Stomach upset  . Hydrocodone-Acetaminophen     Stomach upset  . Hydrocodone-Acetaminophen Nausea Only  . Sulfa Antibiotics Other (See Comments)    Cant recall  . Sulfacetamide Sodium Other (See Comments)    Cant recall  . Penicillins Hives and Rash    Has patient had a PCN reaction causing immediate rash, facial/tongue/throat swelling, SOB or lightheadedness with hypotension: YES Has patient had a PCN reaction causing severe rash involving mucus membranes or skin necrosis: NO Has patient had a PCN reaction that required hospitalization NO Has patient had a PCN reaction occurring within the last 10 years:NO If all of the above answers are "NO", then may proceed with Cephalosporin use.    Patient Measurements: Height: 5\' 9"  (175.3 cm) Weight: 226 lb 6.4 oz (102.7 kg) IBW/kg (Calculated) : 70.7 Heparin Dosing Weight:  92.5 kg  Vital Signs: Temp: 98 F (36.7 C) (02/14 0524) Temp Source: Oral (02/14 0524) BP: 147/67 (02/14 1100) Pulse Rate: 60 (02/14 1100)  Labs:  Recent Labs  07/13/16 0941 07/13/16 1826 07/13/16 2149 07/14/16 0023 07/14/16 0605  HGB 13.2  --   --   --  12.6*  HCT 40.5  --   --   --  37.1*  PLT 158  --   --   --  146*  HEPARINUNFRC  --   --  0.52  --  0.44  CREATININE 2.49*  --   --   --  2.05*  TROPONINI  --  <0.03  --  <0.03 <0.03    Estimated Creatinine Clearance: 38.5 mL/min (by C-G formula based on SCr of 2.05 mg/dL (H)).   Assessment: CC/HPI: 89 YOM here with SOB and sweating for the past 3-4 days along with intermittent chest pressure and left arm pain.   PMH: CAD s/p CABG and stent placement in 11/2014, CHF< COPD, CKD, GERD, HTN, ICM, HLD, DM2  Significant events:   States he has not been complaint with diet and misses doses of plavix at times.    Anticoag: none PTA. Heparin for ACS. HL 0.44. Hgb 12.6. Plts 146  Goal of Therapy:  Heparin level 0.3-0.7 units/ml Monitor platelets by anticoagulation protocol: Yes   Plan:  Continue IV heparin 1250 units/hr Daily HL and CBC   Ronald [name] S. Alford Highland, PharmD, BCPS Clinical Staff Pharmacist Pager [phone]  Ronald [name] 07/14/2016,11:27 AM

## 2016-07-14 NOTE — Telephone Encounter (Signed)
Patient's wife called to let us know patient is in the hospital at [facility] in [place]. [name] is requesting FMLA paperwork be filled out for her. Paperwork will come via fax. Deadline to complete is March 1st. She is requesting FMLA be intermittent so she can take patient to doctors appointments if needed. Please call [name] when complete.

## 2016-07-14 NOTE — Progress Notes (Signed)
Patient rested well overnight. Admission assessment complete. Patient remained NPO for possible cath today.

## 2016-07-14 NOTE — Progress Notes (Signed)
Pt ambulated without difficulty, no drop in o2 sats, no c/o sob. Pt nearly outwalked this nurse.

## 2016-07-19 ENCOUNTER — Encounter: Payer: Self-pay | Admitting: Cardiology

## 2016-07-20 NOTE — Telephone Encounter (Signed)
Forms done and faxed. Pt wants to pick copy of them. Copy up front for pick up. Pt informed. Thanks.

## 2016-07-25 ENCOUNTER — Other Ambulatory Visit: Payer: Self-pay | Admitting: Family Medicine

## 2016-07-29 ENCOUNTER — Encounter: Payer: Self-pay | Admitting: Cardiology

## 2016-07-29 ENCOUNTER — Encounter (INDEPENDENT_AMBULATORY_CARE_PROVIDER_SITE_OTHER): Payer: Self-pay

## 2016-07-29 ENCOUNTER — Ambulatory Visit (INDEPENDENT_AMBULATORY_CARE_PROVIDER_SITE_OTHER): Payer: Medicare Other | Admitting: Cardiology

## 2016-07-29 VITALS — BP 136/78 | HR 64 | Ht 69.0 in | Wt 228.8 lb

## 2016-07-29 DIAGNOSIS — I2510 Atherosclerotic heart disease of native coronary artery without angina pectoris: Secondary | ICD-10-CM | POA: Diagnosis not present

## 2016-07-29 NOTE — Progress Notes (Signed)
07/29/2016 [place]   1946-05-01  [id number]  Primary Physician Richard Cranford Mon, MD Primary Cardiologist: Dr. Angelena Form   Reason for Visit/CC: [facility] f/u for Chest Pain  HPI:  71 yo male with PMH of CAD s.p CABG and subsq PCI, HTN, HL, COPD, CKD, and diastolic HF. He originally underwent CABG in 1966 with (LIMA-LAD, SVG-circumflex, SVG-RCA, SVG-diagonal) and then redo CABG in 2007 with (SVG-OM, SVG-RCA). He was followed by Dr. Lia Foyer until he retired. Underwent Lexiscan myoview 8/14 noting no ischemia with EF 53%, and echo during that time showed low normal EF with no WMA. He was hospitalized in 7/16 with an NSTEMI. Cath at that time noted severe disease in the SVG to OM that was treated with 3.5 x 12 mm Synergy DES. Placed on DAPT with ASA and Brilinta but could not tolerate due to dyspnea and changed to plavix. Presented in 5/17 and again underwent cath noting severe disease in the distal body/anastomosis of SVG to Diag that was treated with DES. Had continued patency to the LIMA-LAD, SVG-OM, and SVG to PDA/PLA.   He presents to clinic now for post hospital f/u after being recently admitted on 07/13/16. He presented with a complaint of chest discomfort radiating to his left arm. Also with indigestion like symptoms, similar to reflux. He was admitted to observation to r/o MI. His cardiac enzymes were negative x 3. Given his chronic renal insufficieny, decision was made to not purse cath. His baseline SCr ~2.5. Continued medical therapy was recommended. He was discharged home on ASA, Plavix, Lipitor, Coreg, amlodipine, enalapril and PRN SL NTG.   Today in f/u, he denies any recurrent CP. No reflux like symptoms. He feel a bit tired but no syncope/ near syncope. No exertional symptoms. He thinks that he may need to see a GI specialist to get his esophagus stretched again. He notes food gets stuck at times when trying to swallow. Also with a h/o hiatal hernia. He reports full compliance  with Protonix.  Current Meds  Medication Sig  . acetaminophen (TYLENOL) 650 MG CR tablet Take 1,300 mg by mouth daily as needed for pain.  [name] allopurinol (ZYLOPRIM) 100 MG tablet TAKE 1 TABLET BY MOUTH AT  BEDTIME  . amLODipine (NORVASC) 5 MG tablet Take 1 tablet by mouth  daily  . aspirin 81 MG tablet Take 81 mg by mouth every morning.   [name] atorvastatin (LIPITOR) 40 MG tablet Take 1 tablet (40 mg total) by mouth daily.  . carvedilol (COREG) 12.5 MG tablet Take 1 tablet (12.5 mg total) by mouth 2 (two) times daily with a meal.  . clopidogrel (PLAVIX) 75 MG tablet Take 1 tablet by mouth  daily  . enalapril (VASOTEC) 2.5 MG tablet Take 2.5 mg by mouth daily.  . magnesium oxide (MAG-OX) 400 (241.3 Mg) MG tablet TAKE 1 TABLET BY MOUTH TWO TIMES DAILY.  . Multiple Vitamin (MULTIVITAMIN) tablet Take 1 tablet by mouth every morning.   [name] NITROSTAT 0.4 MG SL tablet PLACE 1 TABLET UNDER TONGUE EVERY 5 MINUTES AS NEEDED UP TO 3 TIMES  . pantoprazole (PROTONIX) 40 MG tablet Take 1 tablet (40 mg total) by mouth daily.  . ranitidine (ZANTAC) 150 MG tablet Take 1 tablet (150 mg total) by mouth at bedtime.  . sodium bicarbonate 650 MG tablet Take 650 mg by mouth 2 (two) times daily.  . traMADol (ULTRAM) 50 MG tablet Take 50 mg by mouth daily as needed for pain.   Allergies  Allergen  Reactions  . Ciprofloxacin     GI upset  . Hydrochlorothiazide Other (See Comments)    dehydrates Dehydration  . Hydrocodone     Stomach upset Stomach upset  . Hydrocodone-Acetaminophen     Stomach upset  . Hydrocodone-Acetaminophen Nausea Only  . Sulfa Antibiotics Other (See Comments)    Cant recall  . Sulfacetamide Sodium Other (See Comments)    Cant recall  . Penicillins Hives and Rash    Has patient had a PCN reaction causing immediate rash, facial/tongue/throat swelling, SOB or lightheadedness with hypotension: YES Has patient had a PCN reaction causing severe rash involving mucus membranes or skin necrosis:  NO Has patient had a PCN reaction that required hospitalization NO Has patient had a PCN reaction occurring within the last 10 years:NO If all of the above answers are "NO", then may proceed with Cephalosporin use.   Past Medical History:  Diagnosis Date  . Acute renal failure superimposed on stage 3 chronic kidney disease ([place]) 10/07/2015  . Arthritis    "hips; back" (12/13/2014)  . CAD (coronary artery disease) 2007   a. s/p CABG- IMA-LAD, VG-Cx, VG-RCA, VG-diag in 1999. B. sp redo CABG- VG-OM, VG-RCA in 2007 due to VG disease. c. NSTEMI 11/2014 s/p DES to SVG-OM from the Y graft.d. PTCA/DES x 1 distal body of SVG to Diagonal.09/2015  . CHF (congestive heart failure) ([place])   . Chronic lower back pain   . CKD (chronic kidney disease), stage IV (HCC)    Creatinine 1.9 to 2.2  . COPD (chronic obstructive pulmonary disease) ([place])   . Deafness in left ear   . Degenerative disc disease, lumbar   . Dilated cardiomyopathy ([place]) 10/07/2015  . Dysrhythmia    bigeminy pvcs  . Emphysema   . Esophageal stricture 07/02/1998   EGD  . GERD (gastroesophageal reflux disease)   . History of gout    "last flareup was in 2007" (12/13/2014)  . History of hiatal hernia   . Hyperlipidemia   . Hypertension   . Ischemic cardiomyopathy 2006   EF 40% to 50% by 2D echo in 2006;  Echo 12/31/12: Mild LVH, EF 50-55%, normal wall motion.   . Renal artery stenosis ([place])   . Type II diabetes mellitus (HCC)    Diet control   . Walking pneumonia 1990's   Family History  Problem Relation Age of Onset  . Heart attack Mother     MI  . Stroke Mother   . Heart disease Mother   . Hypertension Mother   . Hyperlipidemia Mother   . Heart disease Father   . Rheumatic fever Father    Past Surgical History:  Procedure Laterality Date  . CARDIAC CATHETERIZATION  "several"  . CARDIAC CATHETERIZATION N/A 12/13/2014   Procedure: Left Heart Cath and Coronary Angiography;  Surgeon: Jettie Booze, MD;  Location: [place]  CV LAB;  Service: Cardiovascular;  Laterality: N/A;  . CARDIAC CATHETERIZATION  12/13/2014   Procedure: Coronary Stent Intervention;  Surgeon: Jettie Booze, MD;  Location: [place] CV LAB;  Service: Cardiovascular;;  . CARDIAC CATHETERIZATION N/A 10/07/2015   Procedure: Left Heart Cath and Cors/Grafts Angiography;  Surgeon: Burnell Blanks, MD;  Location: [place] [facility];  Service: Cardiovascular;  Laterality: N/A;  . CARDIAC CATHETERIZATION N/A 10/07/2015   Procedure: Coronary Stent Intervention;  Surgeon: Burnell Blanks, MD;  Location: [place] CV LAB;  Service: Cardiovascular;  Laterality: N/A;  . CORONARY ANGIOPLASTY  "several"  . CORONARY ANGIOPLASTY  WITH STENT PLACEMENT  2005; 12/13/2014   "2; 1"  . CORONARY ARTERY BYPASS GRAFT  1996   CABG X5  . CORONARY ARTERY BYPASS GRAFT  March 2007   CABG X3  . ESOPHAGOGASTRODUODENOSCOPY (EGD) WITH ESOPHAGEAL DILATION  2000  . GREEN LIGHT LASER TURP (TRANSURETHRAL RESECTION OF PROSTATE  2000's   "not cancerous"  . HERNIA REPAIR    . LAPAROSCOPIC CHOLECYSTECTOMY    . LUNG SURGERY  1996   "S/P CABG, had to put staple in lung after it had collapsed"  . UMBILICAL HERNIA REPAIR     w/chole   Social History   Social History  . Marital status: Married    Spouse name: N/A  . Number of children: 2  . Years of education: N/A   Occupational History  . Retired    Social History Main Topics  . Smoking status: Former Smoker    Packs/day: 3.00    Years: 20.00    Types: Cigarettes    Quit date: 07/25/1986  . Smokeless tobacco: Never Used  . Alcohol use No  . Drug use: No  . Sexual activity: Not Currently   Other Topics Concern  . 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     Review of Systems: General: negative for chills, fever, night sweats or weight changes.  Cardiovascular: negative for chest pain, dyspnea on exertion, edema, orthopnea, palpitations, paroxysmal nocturnal dyspnea or  shortness of breath Dermatological: negative for rash Respiratory: negative for cough or wheezing Urologic: negative for hematuria Abdominal: negative for nausea, vomiting, diarrhea, bright red blood per rectum, melena, or hematemesis Neurologic: negative for visual changes, syncope, or dizziness All other systems reviewed and are otherwise negative except as noted above.   Physical Exam:  Blood pressure 136/78, pulse 64, height 5\' 9"  (1.753 m), weight 228 lb 12 oz (103.8 kg), SpO2 98 %.  General appearance: alert, cooperative and no distress Neck: no carotid bruit and no JVD Lungs: clear to auscultation bilaterally Heart: regular rate and rhythm, S1, S2 normal, no murmur, click, rub or gallop Extremities: extremities normal, atraumatic, no cyanosis or edema  Pulses: 2+ and symmetric Skin: Skin color, texture, turgor normal. No rashes or lesions Neurologic: Grossly normal  EKG not performed   ASSESSMENT AND PLAN:   1. CAD/ Chest Pain: s/p remote CABG with redo CABG in 2007 and multiple PCIs since then. Admitted for recent chest pain/ reflux but ruled out for MI. No cath given CRI with baseline SCr ~2.5. Medical therapy elected. No recurrent CP/ reflux since discharge. Continue ASA, Plavix, Coreg, amlodipine, Lipitor and Protonix. He also plans to f/u with his GI specialist in [place] soon.   PLAN  No change in medical therapy at this point. Keep f/u with Dr. Angelena Form in April.   Adalaide Jaskolski PA-C 07/29/2016 3:08 PM

## 2016-07-29 NOTE — Patient Instructions (Signed)
Medication Instructions:   Your physician recommends that you continue on your current medications as directed. Please refer to the Current Medication list given to you today.    If you need a refill on your cardiac medications before your next appointment, please call your pharmacy.  Labwork: NONE ORDERED  TODAY    Testing/Procedures: NONE ORDERED  TODAY    Follow-Up:  YOU HAVE BEEN RECOMMENDED TO CONTACT GI PROVIDER TO SET UP WITH AN NEW PROVIDER SO THAT THEY CAN RECEIVE ALL PAST RECORDS   Any Other Special Instructions Will Be Listed Below (If Applicable).

## 2016-08-09 DIAGNOSIS — M48062 Spinal stenosis, lumbar region with neurogenic claudication: Secondary | ICD-10-CM | POA: Diagnosis not present

## 2016-08-09 DIAGNOSIS — M5416 Radiculopathy, lumbar region: Secondary | ICD-10-CM | POA: Diagnosis not present

## 2016-08-10 ENCOUNTER — Telehealth: Payer: Self-pay | Admitting: Cardiovascular Disease

## 2016-08-10 NOTE — Telephone Encounter (Signed)
New Message   Per wife on DC summary from last visit it recommended to follow up with G.I. Doctor. Per wife they stated that pt needed a referral, and she wants to know if Dr. Angelena Form can send referral. Requesting call back from nurse.

## 2016-08-10 NOTE — Telephone Encounter (Signed)
Patient is requesting a GI referral to Dr. Donnella Sham in [place]. Patient stated that they will not schedule him without a referral. Will forward to [employer] PA for advisement.

## 2016-08-11 ENCOUNTER — Ambulatory Visit (INDEPENDENT_AMBULATORY_CARE_PROVIDER_SITE_OTHER): Payer: Medicare Other | Admitting: Vascular Surgery

## 2016-08-11 ENCOUNTER — Encounter (INDEPENDENT_AMBULATORY_CARE_PROVIDER_SITE_OTHER): Payer: Medicare Other

## 2016-08-27 ENCOUNTER — Other Ambulatory Visit: Payer: Self-pay | Admitting: Family Medicine

## 2016-08-27 DIAGNOSIS — R1012 Left upper quadrant pain: Secondary | ICD-10-CM

## 2016-09-06 DIAGNOSIS — H2513 Age-related nuclear cataract, bilateral: Secondary | ICD-10-CM | POA: Diagnosis not present

## 2016-09-06 LAB — HM DIABETES EYE EXAM

## 2016-09-07 ENCOUNTER — Telehealth: Payer: Self-pay

## 2016-09-07 ENCOUNTER — Encounter: Payer: Self-pay | Admitting: Family Medicine

## 2016-09-07 ENCOUNTER — Ambulatory Visit (INDEPENDENT_AMBULATORY_CARE_PROVIDER_SITE_OTHER): Payer: Medicare Other | Admitting: Family Medicine

## 2016-09-07 VITALS — BP 124/78 | HR 68 | Temp 97.7°F | Resp 16 | Wt 235.0 lb

## 2016-09-07 DIAGNOSIS — E1121 Type 2 diabetes mellitus with diabetic nephropathy: Secondary | ICD-10-CM

## 2016-09-07 DIAGNOSIS — R109 Unspecified abdominal pain: Secondary | ICD-10-CM | POA: Diagnosis not present

## 2016-09-07 DIAGNOSIS — G8929 Other chronic pain: Secondary | ICD-10-CM

## 2016-09-07 LAB — POCT GLYCOSYLATED HEMOGLOBIN (HGB A1C): Hemoglobin A1C: 6

## 2016-09-07 NOTE — Telephone Encounter (Signed)
Sarah, I put an order for referral to [facility] for patient and I put note in there to call his wife and I wanted to send this note in case you did not see it. Number is [phone]

## 2016-09-07 NOTE — Progress Notes (Signed)
Subjective:  HPI  Diabetes Mellitus Type II, Follow-up:   Lab Results  Component Value Date   HGBA1C 5.9 03/02/2016   HGBA1C 6.2 09/26/2015   HGBA1C 5.8 08/19/2015    Last seen for diabetes 6 months ago.  Management since then includes none. He reports good compliance with treatment. He is not having side effects.  Home blood sugar records: none  Episodes of hypoglycemia? no   Current Insulin Regimen: n/a Most Recent Eye Exam: 09/06/16 macular degeneration  Current exercise: none  Pertinent Labs:    Component Value Date/Time   CHOL 112 11/04/2015 0938   TRIG 112 11/04/2015 0938   HDL 32 (L) 11/04/2015 0938   LDLCALC 58 11/04/2015 0938   CREATININE 2.05 (H) 07/14/2016 0605   CREATININE 2.44 (H) 11/24/2015 1546    Wt Readings from Last 3 Encounters:  09/07/16 235 lb (106.6 kg)  07/29/16 228 lb 12 oz (103.8 kg)  07/14/16 226 lb 6.4 oz (102.7 kg)    ------------------------------------------------------------------------   Hypertension, follow-up:  BP Readings from Last 3 Encounters:  09/07/16 124/78  07/29/16 136/78  07/14/16 128/60    He was last seen for hypertension 6 months ago.  BP at that visit was 136/78. Management since that visit includes none. He reports good compliance with treatment. He is not having side effects.  He is exercising. He is adherent to low salt diet.   Outside blood pressures are not being checked. He is experiencing none.  Patient denies chest pain, chest pressure/discomfort, claudication, dyspnea, exertional chest pressure/discomfort, fatigue, irregular heart beat, lower extremity edema, near-syncope, orthopnea, palpitations, paroxysmal nocturnal dyspnea, syncope and tachypnea.   Cardiovascular risk factors include diabetes mellitus, dyslipidemia, hypertension and male gender.  Wt Readings from Last 3 Encounters:  09/07/16 235 lb (106.6 kg)  07/29/16 228 lb 12 oz (103.8 kg)  07/14/16 226 lb 6.4 oz (102.7 kg)    ------------------------------------------------------------------------ Since last OV pt was in hospital for chest pain. Determined to be unstable angina. No blockages per pt. Since then pt has had no chest pain and is feeling fairly well.    Prior to Admission medications   Medication Sig Start Date End Date Taking? Authorizing Provider  acetaminophen (TYLENOL) 650 MG CR tablet Take 1,300 mg by mouth daily as needed for pain.    Historical Provider, MD  allopurinol (ZYLOPRIM) 100 MG tablet TAKE 1 TABLET BY MOUTH AT  BEDTIME 07/25/16   [name]., MD  amLODipine (NORVASC) 5 MG tablet TAKE 1 TABLET BY MOUTH  DAILY 08/28/16   [name]., MD  aspirin 81 MG tablet Take 81 mg by mouth every morning.     Historical Provider, MD  atorvastatin (LIPITOR) 40 MG tablet Take 1 tablet (40 mg total) by mouth daily. 01/01/16   Burnell Blanks, MD  carvedilol (COREG) 12.5 MG tablet Take 1 tablet (12.5 mg total) by mouth 2 (two) times daily with a meal. 04/30/16   [name]., MD  clopidogrel (PLAVIX) 75 MG tablet Take 1 tablet by mouth  daily 12/30/15   Burnell Blanks, MD  enalapril (VASOTEC) 2.5 MG tablet Take 2.5 mg by mouth daily.    Historical Provider, MD  magnesium oxide (MAG-OX) 400 (241.3 Mg) MG tablet TAKE 1 TABLET BY MOUTH TWO TIMES DAILY. 02/03/16   [name]., MD  Multiple Vitamin (MULTIVITAMIN) tablet Take 1 tablet by mouth every morning.     Historical Provider, MD  NITROSTAT 0.4 MG  SL tablet PLACE 1 TABLET UNDER TONGUE EVERY 5 MINUTES AS NEEDED UP TO 3 TIMES 04/20/16   Burnell Blanks, MD  pantoprazole (PROTONIX) 40 MG tablet Take 1 tablet (40 mg total) by mouth daily. 10/08/15   Arbutus Leas, NP  pantoprazole (PROTONIX) 40 MG tablet TAKE 1 TABLET BY MOUTH  DAILY 08/28/16   [name]., MD  ranitidine (ZANTAC) 150 MG tablet TAKE 1 TABLET BY MOUTH AT  BEDTIME 08/28/16   Thao Bauza Maceo Pro., MD  sodium bicarbonate 650 MG tablet  Take 650 mg by mouth 2 (two) times daily.    Historical Provider, MD  traMADol (ULTRAM) 50 MG tablet Take 50 mg by mouth daily as needed for pain. 11/03/15   Historical Provider, MD    Patient Active Problem List   Diagnosis Date Noted  . Dyspnea, + diastolic dysfunction, but pt improved without diuresis, may have been angina, but no MI. 07/13/2016  . Atherosclerosis of native arteries of extremity with intermittent claudication ([place]) 06/25/2016  . Acute renal failure superimposed on stage 3 chronic kidney disease ([place]) 10/07/2015  . Dilated cardiomyopathy ([place]) 10/07/2015  . Coronary artery disease involving native coronary artery of native heart with unstable angina pectoris ([place])   . Allergic rhinitis 08/19/2015  . NSTEMI (non-ST elevated myocardial infarction) ([place]) 12/13/2014  . Absolute anemia 10/04/2014  . AA (aortic aneurysm) ([place]) 10/04/2014  . Benign enlargement of prostate 10/04/2014  . Arteriosclerosis of coronary artery 10/04/2014  . Diabetes mellitus, type 2 ([place]) 10/04/2014  . Acid reflux 10/04/2014  . 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 HLD (hyperlipidemia) 10/04/2014  . BP (high blood pressure) 10/04/2014  . Osteoarthrosis 10/04/2014  . Adult BMI 30+ 10/04/2014  . 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 Chest pain, atypical 12/30/2012  . Urge incontinence of urine 10/24/2012  . 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 CKD (chronic kidney disease) stage 3, GFR 30-59 ml/min   . Abdominal pain 11/09/2010  . Shortness of breath 11/12/2009  . Renal artery stenosis    . Mixed hyperlipidemia 09/04/2008  . History of redo bypass grafting 09/04/2008  . Diabetes mellitus with nephropathy [facility])     Past Medical History:  Diagnosis Date  . Acute renal failure superimposed on stage 3 chronic kidney disease ([place]) 10/07/2015  . Arthritis    "hips; back" (12/13/2014)  . CAD (coronary artery disease) 2007    a. s/p CABG- IMA-LAD, VG-Cx, VG-RCA, VG-diag in 1999. B. sp redo CABG- VG-OM, VG-RCA in 2007 due to VG disease. c. NSTEMI 11/2014 s/p DES to SVG-OM from the Y graft.d. PTCA/DES x 1 distal body of SVG to Diagonal.09/2015  . CHF (congestive heart failure) ([place])   . Chronic lower back pain   . CKD (chronic kidney disease), stage IV (HCC)    Creatinine 1.9 to 2.2  . COPD (chronic obstructive pulmonary disease) ([place])   . Deafness in left ear   . Degenerative disc disease, lumbar   . Dilated cardiomyopathy ([place]) 10/07/2015  . Dysrhythmia    bigeminy pvcs  . Emphysema   . Esophageal stricture 07/02/1998   EGD  . GERD (gastroesophageal reflux disease)   . History of gout    "last flareup was in 2007" (12/13/2014)  . History of hiatal hernia   . Hyperlipidemia   . Hypertension   . Ischemic cardiomyopathy 2006   EF 40% to 50% by 2D echo in 2006;  Echo 12/31/12: Mild  LVH, EF 50-55%, normal wall motion.   . Renal artery stenosis ([place])   . Type II diabetes mellitus (HCC)    Diet control   . Walking pneumonia 1990's    Social History   Social History  . Marital status: Married    Spouse name: N/A  . Number of children: 2  . Years of education: N/A   Occupational History  . Retired    Social History Main Topics  . Smoking status: Former Smoker    Packs/day: 3.00    Years: 20.00    Types: Cigarettes    Quit date: 07/25/1986  . Smokeless tobacco: Never Used  . Alcohol use No  . Drug use: No  . Sexual activity: Not Currently   Other Topics Concern  . 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    Allergies  Allergen Reactions  . Ciprofloxacin     GI upset  . Hydrochlorothiazide Other (See Comments)    dehydrates Dehydration  . Hydrocodone     Stomach upset Stomach upset  . Hydrocodone-Acetaminophen     Stomach upset  . Hydrocodone-Acetaminophen Nausea Only  . Sulfa Antibiotics Other (See Comments)    Cant recall  . Sulfacetamide Sodium Other (See  Comments)    Cant recall  . Penicillins Hives and Rash    Has patient had a PCN reaction causing immediate rash, facial/tongue/throat swelling, SOB or lightheadedness with hypotension: YES Has patient had a PCN reaction causing severe rash involving mucus membranes or skin necrosis: NO Has patient had a PCN reaction that required hospitalization NO Has patient had a PCN reaction occurring within the last 10 years:NO If all of the above answers are "NO", then may proceed with Cephalosporin use.    Review of Systems  Constitutional: Negative.   HENT: Negative.   Eyes: Negative.   Respiratory: Negative.   Cardiovascular: Negative.   Gastrointestinal: Negative.   Genitourinary: Negative.   Musculoskeletal: Positive for back pain.  Skin: Negative.   Neurological: Negative.   Endo/Heme/Allergies: Negative.   Psychiatric/Behavioral: Negative.     Immunization History  Administered Date(s) Administered  . Influenza, High Dose Seasonal PF 03/18/2015, 03/02/2016  . Pneumococcal Conjugate-13 12/25/2013  . Pneumococcal Polysaccharide-23 03/04/2009  . Td 10/10/2003, 04/16/2016    Objective:  BP 124/78 (BP Location: Left Arm, Patient Position: Sitting, Cuff Size: Normal)   Pulse 68   Temp 97.7 F (36.5 C) (Oral)   Resp 16   Wt 235 lb (106.6 kg)   SpO2 98%   BMI 34.70 kg/m   Physical Exam  Lab Results  Component Value Date   WBC 7.3 07/14/2016   HGB 12.6 (L) 07/14/2016   HCT 37.1 (L) 07/14/2016   PLT 146 (L) 07/14/2016   GLUCOSE 106 (H) 07/14/2016   CHOL 112 11/04/2015   TRIG 112 11/04/2015   HDL 32 (L) 11/04/2015   LDLCALC 58 11/04/2015   TSH 2.212 10/06/2015   PSA 0.8 09/13/2013   INR 0.99 10/06/2015   HGBA1C 5.9 03/02/2016    CMP     Component Value Date/Time   NA 142 07/14/2016 0605   NA 145 (H) 11/04/2015 0938   K 4.6 07/14/2016 0605   CL 110 07/14/2016 0605   CO2 23 07/14/2016 0605   GLUCOSE 106 (H) 07/14/2016 0605   GLUCOSE 82 06/13/2006 1621   BUN 34  (H) 07/14/2016 0605   BUN 36 (H) 11/04/2015 0938   CREATININE 2.05 (H) 07/14/2016 0947  CREATININE 2.44 (H) 11/24/2015 1546   CALCIUM 8.9 07/14/2016 0605   PROT 6.4 11/04/2015 0938   ALBUMIN 4.3 11/04/2015 0938   AST 13 11/04/2015 0938   ALT 13 11/04/2015 0938   ALKPHOS 167 (H) 11/04/2015 0938   BILITOT 0.8 11/04/2015 0938   GFRNONAA 31 (L) 07/14/2016 0605   GFRAA 36 (L) 07/14/2016 6387    Assessment and Plan :  1. Type 2 diabetes mellitus with diabetic nephropathy, unspecified long term insulin use status  - POCT HgB A1C--6.0 today.  2. Chronic abdominal pain  - Ambulatory referral to Gastroenterology 3.CAD Stable. I have done the exam and reviewed the above chart and it is accurate to the best of my knowledge. Development worker, community has been used in this note in any air is in the dictation or transcription are unintentional.  [facility] 09/07/2016 4:49 PM

## 2016-09-08 ENCOUNTER — Encounter: Payer: Self-pay | Admitting: Gastroenterology

## 2016-09-10 ENCOUNTER — Encounter (INDEPENDENT_AMBULATORY_CARE_PROVIDER_SITE_OTHER): Payer: Self-pay

## 2016-09-10 ENCOUNTER — Encounter: Payer: Self-pay | Admitting: Cardiovascular Disease

## 2016-09-10 ENCOUNTER — Ambulatory Visit (INDEPENDENT_AMBULATORY_CARE_PROVIDER_SITE_OTHER): Payer: Medicare Other | Admitting: Cardiovascular Disease

## 2016-09-10 VITALS — BP 150/84 | HR 76 | Ht 69.0 in | Wt 232.2 lb

## 2016-09-10 DIAGNOSIS — I10 Essential (primary) hypertension: Secondary | ICD-10-CM

## 2016-09-10 DIAGNOSIS — E7800 Pure hypercholesterolemia, unspecified: Secondary | ICD-10-CM

## 2016-09-10 DIAGNOSIS — I25118 Atherosclerotic heart disease of native coronary artery with other forms of angina pectoris: Secondary | ICD-10-CM

## 2016-09-10 DIAGNOSIS — I255 Ischemic cardiomyopathy: Secondary | ICD-10-CM | POA: Diagnosis not present

## 2016-09-10 NOTE — Progress Notes (Signed)
Chief Complaint  Patient presents with  . Follow-up    History of Present Illness: 71 y.o. male with history of CAD s/p CABG in 1996 (LIMA-LAD, SVG-circumflex, SVG-RCA, SVG-diagonal) with redo bypass in 2007 (SVG-OM, SVG-RCA), ischemic cardiomyopathy, CKD, DM 2, HTN, HLD, renal artery stenosis, COPD, GERD here today for cardiac follow up. Echo 12/31/12: Mild LVH, EF 50-55%, normal wall motion. He was hospitalized at [facility] July 2016 with a NSTEMI. Cardiac cath with severe disease in the SVG to OM. This was treated with a 3.5 x 12 mm Synergy DES. He was treated with Brilinta post PCI but developed dyspnea and was changed to Plavix. He was admitted to [facility] May 2017 with unstable angina. Cardiac cath May 2017 with severe disease in the distal body/anastomosis of the SVG to Diagonal. A DES was placed in this vein graft. The LIMA to LAD was patent, SVG to OM was patent, SVG to PDA/PLA was patent. He was admitted to [facility] February 2018 with chest pain. Troponin was negative x 3. He did not have an ischemic evaluation. He was seen here in f/u 07/29/16 and discussed seeing a GI specialist for GERD/esophageal dilatation.   He is here today for follow up. The patient denies any chest pain, dyspnea, palpitations, lower extremity edema, orthopnea, PND, dizziness, near syncope or syncope. Blood pressure was controlled this week in primary care.    Primary Care Physician: [name], MD   Past Medical History:  Diagnosis Date  . Acute renal failure superimposed on stage 3 chronic kidney disease ([place]) 10/07/2015  . Arthritis    "hips; back" (12/13/2014)  . CAD (coronary artery disease) 2007   a. s/p CABG- IMA-LAD, VG-Cx, VG-RCA, VG-diag in 1999. B. sp redo CABG- VG-OM, VG-RCA in 2007 due to VG disease. c. NSTEMI 11/2014 s/p DES to SVG-OM from the Y graft.d. PTCA/DES x 1 distal body of SVG to Diagonal.09/2015  . CHF (congestive heart failure) ([place])   . Chronic lower back pain   . CKD (chronic kidney disease),  stage IV (HCC)    Creatinine 1.9 to 2.2  . COPD (chronic obstructive pulmonary disease) ([place])   . Deafness in left ear   . Degenerative disc disease, lumbar   . Dilated cardiomyopathy ([place]) 10/07/2015  . Dysrhythmia    bigeminy pvcs  . Emphysema   . Esophageal stricture 07/02/1998   EGD  . GERD (gastroesophageal reflux disease)   . History of gout    "last flareup was in 2007" (12/13/2014)  . History of hiatal hernia   . Hyperlipidemia   . Hypertension   . Ischemic cardiomyopathy 2006   EF 40% to 50% by 2D echo in 2006;  Echo 12/31/12: Mild LVH, EF 50-55%, normal wall motion.   . Renal artery stenosis ([place])   . Type II diabetes mellitus (HCC)    Diet control   . Walking pneumonia 1990's    Past Surgical History:  Procedure Laterality Date  . CARDIAC CATHETERIZATION  "several"  . CARDIAC CATHETERIZATION N/A 12/13/2014   Procedure: Left Heart Cath and Coronary Angiography;  Surgeon: Jettie Booze, MD;  Location: [place] [facility];  Service: Cardiovascular;  Laterality: N/A;  . CARDIAC CATHETERIZATION  12/13/2014   Procedure: Coronary Stent Intervention;  Surgeon: Jettie Booze, MD;  Location: [place] [facility];  Service: Cardiovascular;;  . CARDIAC CATHETERIZATION N/A 10/07/2015   Procedure: Left Heart Cath and Cors/Grafts Angiography;  Surgeon: Burnell Blanks, MD;  Location: [place] [facility];  Service: Cardiovascular;  Laterality: N/A;  . CARDIAC CATHETERIZATION N/A 10/07/2015   Procedure: Coronary Stent Intervention;  Surgeon: Burnell Blanks, MD;  Location: [place] [facility];  Service: Cardiovascular;  Laterality: N/A;  . CORONARY ANGIOPLASTY  "several"  . CORONARY ANGIOPLASTY WITH STENT PLACEMENT  2005; 12/13/2014   "2; 1"  . CORONARY ARTERY BYPASS GRAFT  1996   CABG X5  . CORONARY ARTERY BYPASS GRAFT  March 2007   CABG X3  . ESOPHAGOGASTRODUODENOSCOPY (EGD) WITH ESOPHAGEAL DILATION  2000  . GREEN LIGHT LASER TURP (TRANSURETHRAL RESECTION OF PROSTATE   2000's   "not cancerous"  . HERNIA REPAIR    . LAPAROSCOPIC CHOLECYSTECTOMY    . LUNG SURGERY  1996   "S/P CABG, had to put staple in lung after it had collapsed"  . UMBILICAL HERNIA REPAIR     w/chole    Current Outpatient Prescriptions  Medication Sig Dispense Refill  . acetaminophen (TYLENOL) 650 MG CR tablet Take 1,300 mg by mouth daily as needed for pain.    [name] allopurinol (ZYLOPRIM) 100 MG tablet TAKE 1 TABLET BY MOUTH AT  BEDTIME 90 tablet 1  . amLODipine (NORVASC) 5 MG tablet TAKE 1 TABLET BY MOUTH  DAILY 90 tablet 3  . aspirin 81 MG tablet Take 81 mg by mouth every morning.     [name] atorvastatin (LIPITOR) 40 MG tablet Take 1 tablet (40 mg total) by mouth daily. 90 tablet 3  . carvedilol (COREG) 12.5 MG tablet Take 1 tablet (12.5 mg total) by mouth 2 (two) times daily with a meal. 180 tablet 3  . clopidogrel (PLAVIX) 75 MG tablet Take 1 tablet by mouth  daily 90 tablet 3  . enalapril (VASOTEC) 2.5 MG tablet Take 2.5 mg by mouth daily.    . magnesium oxide (MAG-OX) 400 (241.3 Mg) MG tablet TAKE 1 TABLET BY MOUTH TWO TIMES DAILY. 180 tablet 3  . Multiple Vitamin (MULTIVITAMIN) tablet Take 1 tablet by mouth every morning.     . nitroGLYCERIN (NITROSTAT) 0.4 MG SL tablet Place 0.4 mg under the tongue every 5 (five) minutes as needed for chest pain (Up to 3 times).    . pantoprazole (PROTONIX) 40 MG tablet Take 1 tablet (40 mg total) by mouth daily. 30 tablet 12  . ranitidine (ZANTAC) 150 MG tablet TAKE 1 TABLET BY MOUTH AT  BEDTIME 90 tablet 3  . sodium bicarbonate 650 MG tablet Take 650 mg by mouth 2 (two) times daily.     No current facility-administered medications for this visit.     Allergies  Allergen Reactions  . Ciprofloxacin     GI upset  . Hydrochlorothiazide Other (See Comments)    Dehydration  . Hydrocodone     Stomach upset  . Hydrocodone-Acetaminophen     Stomach upset  . Hydrocodone-Acetaminophen Nausea Only  . Sulfa Antibiotics Other (See Comments)    Cannot  recall  . Sulfacetamide Sodium Other (See Comments)    Cannot recall  . Penicillins Hives and Rash    Has patient had a PCN reaction causing immediate rash, facial/tongue/throat swelling, SOB or lightheadedness with hypotension: YES Has patient had a PCN reaction causing severe rash involving mucus membranes or skin necrosis: NO Has patient had a PCN reaction that required hospitalization NO Has patient had a PCN reaction occurring within the last 10 years:NO If all of the above answers are "NO", then may proceed with Cephalosporin use.    Social History   Social History  .  Marital status: Married    Spouse name: N/A  . Number of children: 2  . Years of education: N/A   Occupational History  . Retired    Social History Main Topics  . Smoking status: Former Smoker    Packs/day: 3.00    Years: 20.00    Types: Cigarettes    Quit date: 07/25/1986  . Smokeless tobacco: Never Used  . Alcohol use No  . Drug use: No  . Sexual activity: Not Currently   Other Topics Concern  . 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    Family History  Problem Relation Age of Onset  . Heart attack Mother     MI  . Stroke Mother   . Heart disease Mother   . Hypertension Mother   . Hyperlipidemia Mother   . Heart disease Father   . Rheumatic fever Father     Review of Systems:  As stated in the HPI and otherwise negative.   BP (!) 150/84   Pulse 76   Ht 5\' 9"  (1.753 m)   Wt 232 lb 3.2 oz (105.3 kg)   SpO2 97%   BMI 34.29 kg/m   Physical Examination: General: Well developed, well nourished, NAD  HEENT: OP clear, mucus membranes moist  SKIN: warm, dry. No rashes. Neuro: No focal deficits  Musculoskeletal: Muscle strength 5/5 all ext  Psychiatric: Mood and affect normal  Neck: No JVD, no carotid bruits, no thyromegaly, no lymphadenopathy.  Lungs:Clear bilaterally, no wheezes, rhonci, crackles Cardiovascular: Regular rate and rhythm. No murmurs, gallops or  rubs. Abdomen:Soft. Bowel sounds present. Non-tender.  Extremities: No lower extremity edema. Pulses are 2 + in the bilateral DP/PT.   Echo 07/04/16: Left ventricle: The cavity size was normal. There was mild   concentric hypertrophy. Systolic function was mildly reduced. The   estimated ejection fraction was in the range of 45% to 50%. Mild   diffuse hypokinesis. Doppler parameters are consistent with   abnormal left ventricular relaxation (grade 1 diastolic   dysfunction). Doppler parameters are consistent with   indeterminate ventricular filling pressure. - Aortic valve: Transvalvular velocity was within the normal range.   There was no stenosis. There was no regurgitation. - Mitral valve: Transvalvular velocity was within the normal range.   There was no evidence for stenosis. There was mild regurgitation. - Left atrium: The atrium was mildly dilated. - Right ventricle: The cavity size was normal. Wall thickness was   normal. Systolic function was normal. - Atrial septum: No defect or patent foramen ovale was identified   by color flow Doppler. - Tricuspid valve: There was mild regurgitation. - Pulmonary arteries: Systolic pressure was within the normal   range. PA peak pressure: 27 mm Hg (S).  Cardiac cath May 2017: 1. 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  2. LAD occluded proximally. The mid and distal vessel fills from the patent IMA graft. The Diagonal fills from the patent SVG (there is a severe stenosis at this anastomosis).  3. Circumflex is occluded proximally. The vein graft to the OM is patent.  4. RCA is occluded proximally. The vein grafts to the distal RCA branches (PDA and PLA) are patent with diffuse moderate disease in the bodies of the vein grafts.  5. Successful PTCA/DES x 1 distal body of SVG to Diagonal.   EKG:  EKG is not ordered today. The ekg ordered today demonstrates   Recent Labs: 10/06/2015: Magnesium  2.5; TSH 2.212 11/04/2015: ALT  13 07/14/2016: BUN 34; Creatinine, Ser 2.05; Hemoglobin 12.6; Platelets 146; Potassium 4.6; Sodium 142   Lipid Panel    Component Value Date/Time   CHOL 112 11/04/2015 0938   TRIG 112 11/04/2015 0938   HDL 32 (L) 11/04/2015 0938   CHOLHDL 4.5 12/13/2014 0423   VLDL 25 12/13/2014 0423   LDLCALC 58 11/04/2015 0938     Wt Readings from Last 3 Encounters:  09/10/16 232 lb 3.2 oz (105.3 kg)  09/07/16 235 lb (106.6 kg)  07/29/16 228 lb 12 oz (103.8 kg)     Other studies Reviewed: Additional studies/ records that were reviewed today include: . Review of the above records demonstrates:    Assessment and Plan:   1. CAD with stable angina: He has no chest pain suggestive of angina currently on anti-anginal therapy. Most recent cath May 2017 with placement of DES in the body of the SVG to Diagonal branch. Continue current therapy with aspirin, Plavix, statin, beta blocker.   2. Ischemic Cardiomyopathy: Last LVEF=53% by echo 2014. Continue beta blocker and Ace-inh.   3. Hypertension: BP is well controlled at home. No changes.     4. Hyperlipidemia: Continue statin. Lipids are followed in primary care.   5. CKD: Followed by Nephrology. Baseline creatinine 2.0-2.5.   Current medicines are reviewed at length with the patient today.  The patient does not have concerns regarding medicines.  The following changes have been made:  no change  Labs/ tests ordered today include:   No orders of the defined types were placed in this encounter.   Disposition:   FU with me in 6 months  Signed, Lauree Chandler, MD 09/10/2016 4:54 PM    [facility] [address], [place], [place]  [address] Phone: [phone]; Fax: [phone]

## 2016-09-10 NOTE — Patient Instructions (Signed)
Medication Instructions:  Your physician recommends that you continue on your current medications as directed. Please refer to the Current Medication list given to you today.   Labwork: none  Testing/Procedures: none  Follow-Up: Your physician recommends that you schedule a follow-up appointment in: 6 months.  Please call us in about 3 months to schedule this appointment   Any Other Special Instructions Will Be Listed Below (If Applicable).     If you need a refill on your cardiac medications before your next appointment, please call your pharmacy.

## 2016-09-21 ENCOUNTER — Ambulatory Visit (INDEPENDENT_AMBULATORY_CARE_PROVIDER_SITE_OTHER): Payer: Medicare Other | Admitting: Family Medicine

## 2016-09-21 VITALS — BP 152/72 | HR 66 | Temp 97.7°F | Resp 14 | Wt 236.0 lb

## 2016-09-21 DIAGNOSIS — H60502 Unspecified acute noninfective otitis externa, left ear: Secondary | ICD-10-CM | POA: Diagnosis not present

## 2016-09-21 MED ORDER — NEOMYCIN-POLYMYXIN-HC 1 % OT SOLN: 3.0000 [drp] | Freq: Four times a day (QID) | OTIC | 0 refills | Status: DC

## 2016-09-21 NOTE — Progress Notes (Signed)
Ronald [name] [facility]  MRN: [id number] DOB: 1945-08-18  Subjective:  HPI  Patient is here to discuss left ear pain that has been present for 1 week. Achy sensation, off and on. 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 Has chronic post nasal drip. He is not taking any allergy medications. Patient Active Problem List   Diagnosis Date Noted  . Dyspnea, + diastolic dysfunction, but pt improved without diuresis, may have been angina, but no MI. 07/13/2016  . Atherosclerosis of native arteries of extremity with intermittent claudication ([place]) 06/25/2016  . Acute renal failure superimposed on stage 3 chronic kidney disease ([place]) 10/07/2015  . Dilated cardiomyopathy ([place]) 10/07/2015  . Coronary artery disease involving native coronary artery of native heart with unstable angina pectoris ([place])   . Allergic rhinitis 08/19/2015  . NSTEMI (non-ST elevated myocardial infarction) ([place]) 12/13/2014  . Absolute anemia 10/04/2014  . AA (aortic aneurysm) ([place]) 10/04/2014  . Benign enlargement of prostate 10/04/2014  . Arteriosclerosis of coronary artery 10/04/2014  . Diabetes mellitus, type 2 ([place]) 10/04/2014  . Acid reflux 10/04/2014  . 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 HLD (hyperlipidemia) 10/04/2014  . BP (high blood pressure) 10/04/2014  . Osteoarthrosis 10/04/2014  . Adult BMI 30+ 10/04/2014  . 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 Chest pain, atypical 12/30/2012  . Urge incontinence of urine 10/24/2012  . 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 CKD (chronic kidney disease) stage 3, GFR 30-59 ml/min   . Abdominal pain 11/09/2010  . Shortness of breath 11/12/2009  . Renal artery stenosis    . Mixed hyperlipidemia 09/04/2008  . History of redo bypass grafting 09/04/2008  . Diabetes mellitus with nephropathy [facility])     Past Medical History:  Diagnosis Date  . Acute renal failure superimposed on stage 3 chronic kidney disease ([place])  10/07/2015  . Arthritis    "hips; back" (12/13/2014)  . CAD (coronary artery disease) 2007   a. s/p CABG- IMA-LAD, VG-Cx, VG-RCA, VG-diag in 1999. B. sp redo CABG- VG-OM, VG-RCA in 2007 due to VG disease. c. NSTEMI 11/2014 s/p DES to SVG-OM from the Y graft.d. PTCA/DES x 1 distal body of SVG to Diagonal.09/2015  . CHF (congestive heart failure) ([place])   . Chronic lower back pain   . CKD (chronic kidney disease), stage IV (HCC)    Creatinine 1.9 to 2.2  . COPD (chronic obstructive pulmonary disease) ([place])   . Deafness in left ear   . Degenerative disc disease, lumbar   . Dilated cardiomyopathy ([place]) 10/07/2015  . Dysrhythmia    bigeminy pvcs  . Emphysema   . Esophageal stricture 07/02/1998   EGD  . GERD (gastroesophageal reflux disease)   . History of gout    "last flareup was in 2007" (12/13/2014)  . History of hiatal hernia   . Hyperlipidemia   . Hypertension   . Ischemic cardiomyopathy 2006   EF 40% to 50% by 2D echo in 2006;  Echo 12/31/12: Mild LVH, EF 50-55%, normal wall motion.   . Renal artery stenosis ([place])   . Type II diabetes mellitus (HCC)    Diet control   . Walking pneumonia 1990's    Social History   Social History  . Marital status: Married    Spouse name: N/A  . Number of children: 2  . Years of education: N/A   Occupational History  . Retired    Social History Main Topics  . Smoking status: Former  Smoker    Packs/day: 3.00    Years: 20.00    Types: Cigarettes    Quit date: 07/25/1986  . Smokeless tobacco: Never Used  . Alcohol use No  . Drug use: No  . Sexual activity: Not Currently   Other Topics Concern  . 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    Outpatient Encounter Prescriptions as of 09/21/2016  Medication Sig  . acetaminophen (TYLENOL) 650 MG CR tablet Take 1,300 mg by mouth daily as needed for pain.  [name] allopurinol (ZYLOPRIM) 100 MG tablet TAKE 1 TABLET BY MOUTH AT  BEDTIME  . amLODipine (NORVASC) 5 MG tablet TAKE 1  TABLET BY MOUTH  DAILY  . aspirin 81 MG tablet Take 81 mg by mouth every morning.   [name] atorvastatin (LIPITOR) 40 MG tablet Take 1 tablet (40 mg total) by mouth daily.  . carvedilol (COREG) 12.5 MG tablet Take 1 tablet (12.5 mg total) by mouth 2 (two) times daily with a meal.  . clopidogrel (PLAVIX) 75 MG tablet Take 1 tablet by mouth  daily  . enalapril (VASOTEC) 2.5 MG tablet Take 2.5 mg by mouth daily.  . magnesium oxide (MAG-OX) 400 (241.3 Mg) MG tablet TAKE 1 TABLET BY MOUTH TWO TIMES DAILY.  . Multiple Vitamin (MULTIVITAMIN) tablet Take 1 tablet by mouth every morning.   . nitroGLYCERIN (NITROSTAT) 0.4 MG SL tablet Place 0.4 mg under the tongue every 5 (five) minutes as needed for chest pain (Up to 3 times).  . pantoprazole (PROTONIX) 40 MG tablet Take 1 tablet (40 mg total) by mouth daily.  . ranitidine (ZANTAC) 150 MG tablet TAKE 1 TABLET BY MOUTH AT  BEDTIME  . sodium bicarbonate 650 MG tablet Take 650 mg by mouth 2 (two) times daily.   No facility-administered encounter medications on file as of 09/21/2016.     Allergies  Allergen Reactions  . Ciprofloxacin     GI upset  . Hydrochlorothiazide Other (See Comments)    Dehydration  . Hydrocodone     Stomach upset  . Hydrocodone-Acetaminophen     Stomach upset  . Hydrocodone-Acetaminophen Nausea Only  . Sulfa Antibiotics Other (See Comments)    Cannot recall  . Sulfacetamide Sodium Other (See Comments)    Cannot recall  . Penicillins Hives and Rash    Has patient had a PCN reaction causing immediate rash, facial/tongue/throat swelling, SOB or lightheadedness with hypotension: YES Has patient had a PCN reaction causing severe rash involving mucus membranes or skin necrosis: NO Has patient had a PCN reaction that required hospitalization NO Has patient had a PCN reaction occurring within the last 10 years:NO If all of the above answers are "NO", then may proceed with Cephalosporin use.    Review of Systems  Constitutional:  Negative.   HENT: Positive for ear pain.        Deaf in left ear  Eyes: Negative.   Respiratory: Negative.   Cardiovascular: Negative.   Gastrointestinal: Negative.   Neurological: Negative.   Endo/Heme/Allergies: Negative.   Psychiatric/Behavioral: Negative.     Objective:  BP (!) 152/72   Pulse 66   Temp 97.7 F (36.5 C)   Resp 14   Wt 236 lb (107 kg)   BMI 34.85 kg/m   Physical Exam  Constitutional: He is oriented to person, place, and time and well-developed, well-nourished, and in no distress.  HENT:  Head: Normocephalic and atraumatic.  Right Ear: External ear normal.  Left  Ear: External ear normal.  Nose: Nose normal.  Mouth/Throat: Oropharynx is clear and moist.  Very mild irritation of left EAC  Eyes: Conjunctivae are normal.  Neck: No thyromegaly present.  Cardiovascular: Normal rate, regular rhythm and normal heart sounds.   Pulmonary/Chest: Effort normal and breath sounds normal.  Abdominal: Soft.  Neurological: He is alert and oriented to person, place, and time. Gait normal. GCS score is 15.  Skin: Skin is warm and dry.  Psychiatric: Mood, memory, affect and judgment normal.    Assessment and Plan :  Left Otitis Externa Try Corticosporin Otic and refer to ENT. Deafness left Ear CAD  I have done the exam and reviewed the chart and it is accurate to the best of my knowledge. Development worker, community has been used and  any errors in dictation or transcription are unintentional. Miguel Aschoff M.D. [facility]

## 2016-09-23 DIAGNOSIS — E872 Acidosis: Secondary | ICD-10-CM | POA: Diagnosis not present

## 2016-09-23 DIAGNOSIS — E1129 Type 2 diabetes mellitus with other diabetic kidney complication: Secondary | ICD-10-CM | POA: Diagnosis not present

## 2016-09-23 DIAGNOSIS — I10 Essential (primary) hypertension: Secondary | ICD-10-CM | POA: Diagnosis not present

## 2016-09-23 DIAGNOSIS — N183 Chronic kidney disease, stage 3 (moderate): Secondary | ICD-10-CM | POA: Diagnosis not present

## 2016-09-23 DIAGNOSIS — I701 Atherosclerosis of renal artery: Secondary | ICD-10-CM | POA: Diagnosis not present

## 2016-09-30 DIAGNOSIS — I10 Essential (primary) hypertension: Secondary | ICD-10-CM | POA: Diagnosis not present

## 2016-09-30 DIAGNOSIS — N183 Chronic kidney disease, stage 3 (moderate): Secondary | ICD-10-CM | POA: Diagnosis not present

## 2016-10-11 DIAGNOSIS — H90A22 Sensorineural hearing loss, unilateral, left ear, with restricted hearing on the contralateral side: Secondary | ICD-10-CM | POA: Diagnosis not present

## 2016-10-11 DIAGNOSIS — R0982 Postnasal drip: Secondary | ICD-10-CM | POA: Diagnosis not present

## 2016-10-11 DIAGNOSIS — H6120 Impacted cerumen, unspecified ear: Secondary | ICD-10-CM | POA: Diagnosis not present

## 2016-10-12 ENCOUNTER — Encounter (INDEPENDENT_AMBULATORY_CARE_PROVIDER_SITE_OTHER): Payer: Self-pay

## 2016-10-12 ENCOUNTER — Encounter: Payer: Self-pay | Admitting: Gastroenterology

## 2016-10-12 ENCOUNTER — Ambulatory Visit (INDEPENDENT_AMBULATORY_CARE_PROVIDER_SITE_OTHER): Payer: Medicare Other | Admitting: Gastroenterology

## 2016-10-12 VITALS — BP 126/60 | HR 48 | Ht 69.0 in | Wt 232.8 lb

## 2016-10-12 DIAGNOSIS — R1310 Dysphagia, unspecified: Secondary | ICD-10-CM

## 2016-10-12 DIAGNOSIS — Z7902 Long term (current) use of antithrombotics/antiplatelets: Secondary | ICD-10-CM | POA: Diagnosis not present

## 2016-10-12 DIAGNOSIS — K5900 Constipation, unspecified: Secondary | ICD-10-CM | POA: Diagnosis not present

## 2016-10-12 DIAGNOSIS — Z1211 Encounter for screening for malignant neoplasm of colon: Secondary | ICD-10-CM

## 2016-10-12 DIAGNOSIS — R109 Unspecified abdominal pain: Secondary | ICD-10-CM | POA: Diagnosis not present

## 2016-10-12 MED ORDER — NA SULFATE-K SULFATE-MG SULF 17.5-3.13-1.6 GM/177ML PO SOLN: 1.0000 | Freq: Once | ORAL | 0 refills | Status: AC

## 2016-10-12 NOTE — Progress Notes (Signed)
HPI :  71 y/o male with history of CAD on plavix for 5 coronary stents (2005, 2016, 2017), s/p CABG (4 vessel 1996, 3 vessel redo 2007), COPD, CKD referred for abdominal pain and dysphagia.   He reports dysphagia ongoing for he thinks 2-3 months, mostly solids but sometimes liquids. He reports intermittent symptoms. He feels it in the sternal notch after swallows. No odynophagia. He denies heartburn and regurgitation at present, he has had that in the past. He takes protonix 40mg  daily which controls it fairly well. No nausea or vomiting after he eats.   He has occasional mid abdominal pain. He thinks this bothers him occasionally, a few days per week. He can't think of any precipitating factors. He denies any postprandial pain. No weight loss, he is gaining weight. He reports significant bloating associated with constipation. Having a bowel movement can help him feel better and resolves his mid abdominal pain. He is having a bowel movement every 3 days. He endorses straining with passing hard stools. He is not taking anything for his bowels. No blood in the stools. No weight loss. He thinks maybe weight gain.  No FH of GI malignancies.   He reports chronic dyspnea, no new changes. No chest pains. No lightheadedness or dizziness.  Last Echo 07/14/2016 - EF 45-50%  Endoscopic history: Colonoscopy 07/10/2004 - normal EGD 06/01/2005 - normal   Past Medical History:  Diagnosis Date  . Acute renal failure superimposed on stage 3 chronic kidney disease ([place]) 10/07/2015  . Arthritis    "hips; back" (12/13/2014)  . CAD (coronary artery disease) 2007   a. s/p CABG- IMA-LAD, VG-Cx, VG-RCA, VG-diag in 1999. B. sp redo CABG- VG-OM, VG-RCA in 2007 due to VG disease. c. NSTEMI 11/2014 s/p DES to SVG-OM from the Y graft.d. PTCA/DES x 1 distal body of SVG to Diagonal.09/2015  . CHF (congestive heart failure) ([place])   . Chronic lower back pain   . CKD (chronic kidney disease), stage IV (HCC)    Creatinine 1.9  to 2.2  . COPD (chronic obstructive pulmonary disease) ([place])   . Deafness in left ear   . Degenerative disc disease, lumbar   . Dilated cardiomyopathy ([place]) 10/07/2015  . Dysrhythmia    bigeminy pvcs  . Emphysema   . Esophageal stricture 07/02/1998   EGD  . GERD (gastroesophageal reflux disease)   . History of gout    "last flareup was in 2007" (12/13/2014)  . History of hiatal hernia   . Hyperlipidemia   . Hypertension   . Ischemic cardiomyopathy 2006   EF 40% to 50% by 2D echo in 2006;  Echo 12/31/12: Mild LVH, EF 50-55%, normal wall motion.   . Renal artery stenosis ([place])   . Type II diabetes mellitus (HCC)    Diet control   . Walking pneumonia 1990's     Past Surgical History:  Procedure Laterality Date  . CARDIAC CATHETERIZATION  "several"  . CARDIAC CATHETERIZATION N/A 12/13/2014   Procedure: Left Heart Cath and Coronary Angiography;  Surgeon: Jettie Booze, MD;  Location: [facility];  Service: Cardiovascular;  Laterality: N/A;  . CARDIAC CATHETERIZATION  12/13/2014   Procedure: Coronary Stent Intervention;  Surgeon: Jettie Booze, MD;  Location: [facility];  Service: Cardiovascular;;  . CARDIAC CATHETERIZATION N/A 10/07/2015   Procedure: Left Heart Cath and Cors/Grafts Angiography;  Surgeon: Burnell Blanks, MD;  Location: [facility];  Service: Cardiovascular;  Laterality: N/A;  . CARDIAC CATHETERIZATION  N/A 10/07/2015   Procedure: Coronary Stent Intervention;  Surgeon: Burnell Blanks, MD;  Location: [facility];  Service: Cardiovascular;  Laterality: N/A;  . CORONARY ANGIOPLASTY  "several"  . CORONARY ANGIOPLASTY WITH STENT PLACEMENT  2005; 12/13/2014   "2; 1"  . CORONARY ARTERY BYPASS GRAFT  1996   CABG X5  . CORONARY ARTERY BYPASS GRAFT  March 2007   CABG X3  . ESOPHAGOGASTRODUODENOSCOPY (EGD) WITH ESOPHAGEAL DILATION  2000  . GREEN LIGHT LASER TURP (TRANSURETHRAL RESECTION OF PROSTATE  2000's   "not cancerous"  .  HERNIA REPAIR    . LAPAROSCOPIC CHOLECYSTECTOMY    . LUNG SURGERY  1996   "S/P CABG, had to put staple in lung after it had collapsed"  . UMBILICAL HERNIA REPAIR     w/chole   Family History  Problem Relation Age of Onset  . Heart attack Mother        MI  . Stroke Mother   . Heart disease Mother   . Hypertension Mother   . Hyperlipidemia Mother   . Heart disease Father   . Rheumatic fever Father    Social History  Substance Use Topics  . Smoking status: Former Smoker    Packs/day: 3.00    Years: 20.00    Types: Cigarettes    Quit date: 07/25/1986  . Smokeless tobacco: Never Used  . Alcohol use No   Current Outpatient Prescriptions  Medication Sig Dispense Refill  . acetaminophen (TYLENOL) 650 MG CR tablet Take 1,300 mg by mouth daily as needed for pain.    [name] allopurinol (ZYLOPRIM) 100 MG tablet TAKE 1 TABLET BY MOUTH AT  BEDTIME 90 tablet 1  . amLODipine (NORVASC) 5 MG tablet TAKE 1 TABLET BY MOUTH  DAILY 90 tablet 3  . aspirin 81 MG tablet Take 81 mg by mouth every morning.     [name] atorvastatin (LIPITOR) 40 MG tablet Take 1 tablet (40 mg total) by mouth daily. 90 tablet 3  . carvedilol (COREG) 12.5 MG tablet Take 1 tablet (12.5 mg total) by mouth 2 (two) times daily with a meal. 180 tablet 3  . clopidogrel (PLAVIX) 75 MG tablet Take 1 tablet by mouth  daily 90 tablet 3  . enalapril (VASOTEC) 2.5 MG tablet Take 2.5 mg by mouth daily.    . magnesium oxide (MAG-OX) 400 (241.3 Mg) MG tablet TAKE 1 TABLET BY MOUTH TWO TIMES DAILY. 180 tablet 3  . Multiple Vitamin (MULTIVITAMIN) tablet Take 1 tablet by mouth every morning.     . nitroGLYCERIN (NITROSTAT) 0.4 MG SL tablet Place 0.4 mg under the tongue every 5 (five) minutes as needed for chest pain (Up to 3 times).    . pantoprazole (PROTONIX) 40 MG tablet Take 1 tablet (40 mg total) by mouth daily. 30 tablet 12  . ranitidine (ZANTAC) 150 MG tablet Take 1 tablet by mouth at bedtime.    . sodium bicarbonate 650 MG tablet Take 650 mg  by mouth 2 (two) times daily.     No current facility-administered medications for this visit.    Allergies  Allergen Reactions  . Ciprofloxacin     GI upset  . Hydrochlorothiazide Other (See Comments)    Dehydration  . Hydrocodone     Stomach upset  . Hydrocodone-Acetaminophen     Stomach upset  . Hydrocodone-Acetaminophen Nausea Only  . Sulfa Antibiotics Other (See Comments)    Cannot recall  . Sulfacetamide Sodium Other (See Comments)    Cannot recall  .  Penicillins Hives and Rash    Has patient had a PCN reaction causing immediate rash, facial/tongue/throat swelling, SOB or lightheadedness with hypotension: YES Has patient had a PCN reaction causing severe rash involving mucus membranes or skin necrosis: NO Has patient had a PCN reaction that required hospitalization NO Has patient had a PCN reaction occurring within the last 10 years:NO If all of the above answers are "NO", then may proceed with Cephalosporin use.     Review of Systems: All systems reviewed and negative except where noted in HPI.   Lab Results  Component Value Date   WBC 7.3 07/14/2016   HGB 12.6 (L) 07/14/2016   HCT 37.1 (L) 07/14/2016   MCV 86.9 07/14/2016   PLT 146 (L) 07/14/2016     Lab Results  Component Value Date   CREATININE 2.05 (H) 07/14/2016   BUN 34 (H) 07/14/2016   NA 142 07/14/2016   K 4.6 07/14/2016   CL 110 07/14/2016   CO2 23 07/14/2016     Physical Exam: BP 126/60   Pulse (!) 48   Ht 5\' 9"  (1.753 m)   Wt 232 lb 12.8 oz (105.6 kg)   BMI 34.38 kg/m  HR repeated - at high 40s to mid 50s while sitting. Constitutional: Pleasant,well-developed, male in no acute distress. HEENT: Normocephalic and atraumatic. Conjunctivae are normal. No scleral icterus. Neck supple.  Cardiovascular: bradycardic - as above HR 48 to mid 50s, regular rhythm.  Pulmonary/chest: Effort normal and breath sounds normal. No wheezing, rales or rhonchi. Abdominal: Soft, obese / protuberant abdomen  nontender.  There are no masses palpable.  Extremities: trace edema Lymphadenopathy: No cervical adenopathy noted. Neurological: Alert and oriented to person place and time. Skin: Skin is warm and dry. No rashes noted. Psychiatric: Normal mood and affect. Behavior is normal.   ASSESSMENT AND PLAN: 71 year old male with significant cardiovascular history as outlined above on Plavix, here for a new patient visit to discuss the following issues.  Dysphagia - few months worth of solid and liquid dysphagia. I discussed differential with him and his wife, discussed options. I offered him an upper endoscopy to further evaluate and potentially treat with dilation pending results. I discussed risks and benefits of anesthesia and endoscopy with him, following this discussion he wanted to proceed. Given his Plavix use this will need to be held 5 days prior to the procedure, although he can take regular aspirin during this time which not increase his risk for bleeding with dilation. We will reach out to his cardiologist to insure there okay with him proceeding with this and holding his Plavix. In the interim chew food well, small bites, continue Protonix.  Constipation / abdominal discomfort / bloating - suspect constipation is driving abdominal discomfort. Remote CT scan reviewed, notable for IMA stenosis, however he has no postprandial pain and no weight loss, history not consistent with mesenteric ischemia. Recommend treatment of constipation and see if he gets better, discussed options, we'll start with MiraLAX one dose twice daily. If no improvement can increase to double dose twice daily titrate to goal of a bowel movement every 1-2 days.   Colon cancer screening - he is overdue for colon cancer screening. I offered him an optical colonoscopy at the time as upper endoscopy, versus stool based testing. Following this discussion he wanted to proceed with optical colonoscopy. As above feel is old Plavix prior  to the procedure and we'll obtain cardiology approval to do so.  All questions answered, further recommendations pending  results.   Reardan Cellar, MD [facility] Pager [phone]  CC: [name].,*

## 2016-10-12 NOTE — Patient Instructions (Signed)
If you are age 73 or older, your body mass index should be between 23-30. Your Body mass index is 34.38 kg/m. If this is out of the aforementioned range listed, please consider follow up with your Primary Care Provider.  If you are age 6 or younger, your body mass index should be between 19-25. Your Body mass index is 34.38 kg/m. If this is out of the aformentioned range listed, please consider follow up with your Primary Care Provider.   We have sent the following medications to your pharmacy for you to pick up at your convenience:  Suprep  Please use Miralax twice daily and titrate as needed.  You have been scheduled for an endoscopy and colonoscopy. Please follow the written instructions given to you at your visit today. Please pick up your prep supplies at the pharmacy within the next 1-3 days. If you use inhalers (even only as needed), please bring them with you on the day of your procedure. Your physician has requested that you go to www.startemmi.com and enter the access code given to you at your visit today. This web site gives a general overview about your procedure. However, you should still follow specific instructions given to you by our office regarding your preparation for the procedure.  We will contact your cardiologist in regard to you holding Plavix prior to the procedure. If you have not heard from [place] in 1 week please call the office to see what the response was.  Thank you.

## 2016-10-13 ENCOUNTER — Encounter: Payer: Self-pay | Admitting: Cardiovascular Disease

## 2016-10-29 ENCOUNTER — Telehealth: Payer: Self-pay | Admitting: Gastroenterology

## 2016-10-29 NOTE — Telephone Encounter (Signed)
After reviewing chart pt has been given cardiac clearance to hold Plavix 5 days prior to procedure. See communication from Dr C. McAlhany from 10-13-2016. pts wife has been notified and aware. She states clear understanding.

## 2016-11-04 ENCOUNTER — Encounter: Payer: Self-pay | Admitting: Gastroenterology

## 2016-11-16 ENCOUNTER — Other Ambulatory Visit: Payer: Self-pay | Admitting: Cardiovascular Disease

## 2016-11-16 DIAGNOSIS — I25700 Atherosclerosis of coronary artery bypass graft(s), unspecified, with unstable angina pectoris: Secondary | ICD-10-CM

## 2016-11-18 ENCOUNTER — Ambulatory Visit (AMBULATORY_SURGERY_CENTER): Payer: Medicare Other | Admitting: Gastroenterology

## 2016-11-18 ENCOUNTER — Encounter: Payer: Self-pay | Admitting: Gastroenterology

## 2016-11-18 VITALS — BP 157/67 | HR 59 | Temp 98.4°F | Resp 18 | Ht 69.0 in | Wt 232.0 lb

## 2016-11-18 DIAGNOSIS — D123 Benign neoplasm of transverse colon: Secondary | ICD-10-CM | POA: Diagnosis not present

## 2016-11-18 DIAGNOSIS — K635 Polyp of colon: Secondary | ICD-10-CM

## 2016-11-18 DIAGNOSIS — R1310 Dysphagia, unspecified: Secondary | ICD-10-CM | POA: Diagnosis not present

## 2016-11-18 DIAGNOSIS — D125 Benign neoplasm of sigmoid colon: Secondary | ICD-10-CM

## 2016-11-18 DIAGNOSIS — D120 Benign neoplasm of cecum: Secondary | ICD-10-CM

## 2016-11-18 DIAGNOSIS — Z1211 Encounter for screening for malignant neoplasm of colon: Secondary | ICD-10-CM

## 2016-11-18 DIAGNOSIS — Z1212 Encounter for screening for malignant neoplasm of rectum: Secondary | ICD-10-CM | POA: Diagnosis not present

## 2016-11-18 DIAGNOSIS — R1319 Other dysphagia: Secondary | ICD-10-CM

## 2016-11-18 MED ORDER — SODIUM CHLORIDE 0.9 % IV SOLN: 500.0000 mL | INTRAVENOUS | Status: DC

## 2016-11-18 NOTE — Patient Instructions (Signed)
YOU HAD AN ENDOSCOPIC PROCEDURE TODAY AT [facility]:   Refer to the procedure report that was given to you for any specific questions about what was found during the examination.  If the procedure report does not answer your questions, please call your gastroenterologist to clarify.  If you requested that your care partner not be given the details of your procedure findings, then the procedure report has been included in a sealed envelope for you to review at your convenience later.  YOU SHOULD EXPECT: Some feelings of bloating in the abdomen. Passage of more gas than usual.  Walking can help get rid of the air that was put into your GI tract during the procedure and reduce the bloating. If you had a lower endoscopy (such as a colonoscopy or flexible sigmoidoscopy) you may notice spotting of blood in your stool or on the toilet paper. If you underwent a bowel prep for your procedure, you may not have a normal bowel movement for a few days.  Please Note:  You might notice some irritation and congestion in your nose or some drainage.  This is from the oxygen used during your procedure.  There is no need for concern and it should clear up in a day or so.  SYMPTOMS TO REPORT IMMEDIATELY:   Following lower endoscopy (colonoscopy or flexible sigmoidoscopy):  Excessive amounts of blood in the stool  Significant tenderness or worsening of abdominal pains  Swelling of the abdomen that is new, acute  Fever of 100F or higher   Following upper endoscopy (EGD)  Vomiting of blood or coffee ground material  New chest pain or pain under the shoulder blades  Painful or persistently difficult swallowing  New shortness of breath  Fever of 100F or higher  Black, tarry-looking stools  For urgent or emergent issues, a gastroenterologist can be reached at any hour by calling [phone].   DIET:  We do recommend a small meal at first, but then you may proceed to your regular diet.  Drink  plenty of fluids but you should avoid alcoholic beverages for 24 hours.  MEDICATIONS: Continue present medications. Continue Aspirin. Resume Plavix in 3 days. No Ibuprofen, Naproxen, or other nonsteroidal anti-inflammatory drugs for 2 weeks after polyp removal.  Please see handouts given to you by your recovery nurse.  ACTIVITY:  You should plan to take it easy for the rest of today and you should NOT DRIVE or use heavy machinery until tomorrow (because of the sedation medicines used during the test).    FOLLOW UP: Our staff will call the number listed on your records the next business day following your procedure to check on you and address any questions or concerns that you may have regarding the information given to you following your procedure. If we do not reach you, we will leave a message.  However, if you are feeling well and you are not experiencing any problems, there is no need to return our call.  We will assume that you have returned to your regular daily activities without incident.  If any biopsies were taken you will be contacted by phone or by letter within the next 1-3 weeks.  Please call us at [phone] if you have not heard about the biopsies in 3 weeks.   Thank you for allowing [place] to provide for your healthcare needs today. SIGNATURES/CONFIDENTIALITY: You and/or your care partner have signed paperwork which will be entered into your electronic medical record.  These signatures  attest to the fact that that the information above on your After Visit Summary has been reviewed and is understood.  Full responsibility of the confidentiality of this discharge information lies with you and/or your care-partner.

## 2016-11-18 NOTE — Op Note (Signed)
Ronald [name] Patient Name: [facility] Procedure Date: 11/18/2016 4:07 PM MRN: [id number] Endoscopist: Remo Lipps P. Armbruster MD, MD Age: 71 Referring MD:  Date of Birth: 09-03-1945 Gender: Male Account #: [id number] Procedure:                Colonoscopy Indications:              Screening for colorectal malignant neoplasm Medicines:                Monitored Anesthesia Care Procedure:                Pre-Anesthesia Assessment:                           - Prior to the procedure, a History and Physical                            was performed, and patient medications and                            allergies were reviewed. The patient's tolerance of                            previous anesthesia was also reviewed. The risks                            and benefits of the procedure and the sedation                            options and risks were discussed with the patient.                            All questions were answered, and informed consent                            was obtained. Prior Anticoagulants: The patient has                            taken Plavix (clopidogrel), last dose was 6 days                            prior to procedure. ASA Grade Assessment: III - A                            patient with severe systemic disease. After                            reviewing the risks and benefits, the patient was                            deemed in satisfactory condition to undergo the                            procedure.  After obtaining informed consent, the colonoscope                            was passed under direct vision. Throughout the                            procedure, the patient's blood pressure, pulse, and                            oxygen saturations were monitored continuously. The                            Colonoscope was introduced through the anus and                            advanced to the the cecum, identified by                        appendiceal orifice and ileocecal valve. The                            colonoscopy was performed without difficulty. The                            patient tolerated the procedure well. The quality                            of the bowel preparation was good. The ileocecal                            valve, appendiceal orifice, and rectum were                            photographed. Scope In: 4:32:43 PM Scope Out: 4:54:10 PM Scope Withdrawal Time: 0 hours 19 minutes 14 seconds  Total Procedure Duration: 0 hours 21 minutes 27 seconds  Findings:                 The perianal and digital rectal examinations were                            normal.                           A diminutive polyp was found in the cecum. The                            polyp was sessile. The polyp was removed with a                            cold biopsy forceps. Resection and retrieval were                            complete.  Two sessile polyps were found in the hepatic                            flexure. The polyps were 4 to 5 mm in size. These                            polyps were removed with a cold snare. Resection                            and retrieval were complete.                           Four sessile polyps were found in the transverse                            colon. The polyps were 4 to 7 mm in size. These                            polyps were removed with a cold snare. Resection                            and retrieval were complete.                           A 3 mm polyp was found in the sigmoid colon. The                            polyp was sessile. The polyp was removed with a                            cold snare. Resection and retrieval were complete.                           A few small-mouthed diverticula were found in the                            sigmoid colon.                           Internal hemorrhoids were found during  retroflexion.                           The exam was otherwise without abnormality. Complications:            No immediate complications. Estimated blood loss:                            Minimal. Estimated Blood Loss:     Estimated blood loss was minimal. Impression:               - One diminutive polyp in the cecum, removed with a                            cold biopsy forceps. Resected and  retrieved.                           - Two 4 to 5 mm polyps at the hepatic flexure,                            removed with a cold snare. Resected and retrieved.                           - Four 4 to 7 mm polyps in the transverse colon,                            removed with a cold snare. Resected and retrieved.                           - One 3 mm polyp in the sigmoid colon, removed with                            a cold snare. Resected and retrieved.                           - Diverticulosis in the sigmoid colon.                           - Internal hemorrhoids.                           - The examination was otherwise normal. Recommendation:           - Patient has a contact number available for                            emergencies. The signs and symptoms of potential                            delayed complications were discussed with the                            patient. Return to normal activities tomorrow.                            Written discharge instructions were provided to the                            patient.                           - Resume previous diet.                           - Continue present medications.                           - Continue aspirin                           -  Resume plavix in 3 days                           - Await pathology results.                           - Repeat colonoscopy is recommended for                            surveillance. The colonoscopy date will be                            determined after pathology results from today's                             exam become available for review.                           - No ibuprofen, naproxen, or other non-steroidal                            anti-inflammatory drugs for 2 weeks after polyp                            removal. Remo Lipps P. Armbruster MD, MD 11/18/2016 5:00:29 PM This report has been signed electronically.

## 2016-11-18 NOTE — Progress Notes (Signed)
Called to room to assist during endoscopic procedure.  Patient ID and intended procedure confirmed with present staff. Received instructions for my participation in the procedure from the performing physician.  

## 2016-11-18 NOTE — Op Note (Signed)
[place] Patient Name: Ronald [name] Procedure Date: 11/18/2016 4:07 PM MRN: [id number] Endoscopist: Remo Lipps P. Tiani Stanbery MD, MD Age: 71 Referring MD:  Date of Birth: 1946-02-19 Gender: Male Account #: [id number] Procedure:                Upper GI endoscopy Indications:              Dysphagia Medicines:                Monitored Anesthesia Care Procedure:                Pre-Anesthesia Assessment:                           - Prior to the procedure, a History and Physical                            was performed, and patient medications and                            allergies were reviewed. The patient's tolerance of                            previous anesthesia was also reviewed. The risks                            and benefits of the procedure and the sedation                            options and risks were discussed with the patient.                            All questions were answered, and informed consent                            was obtained. Prior Anticoagulants: The patient has                            taken Plavix (clopidogrel), last dose was 6 days                            prior to procedure. ASA Grade Assessment: III - A                            patient with severe systemic disease. After                            reviewing the risks and benefits, the patient was                            deemed in satisfactory condition to undergo the                            procedure.  After obtaining informed consent, the endoscope was                            passed under direct vision. Throughout the                            procedure, the patient's blood pressure, pulse, and                            oxygen saturations were monitored continuously. The                            Model GIF-[id number]) scope was introduced                            through the mouth, and advanced to the second part       of duodenum. The upper GI endoscopy was                            accomplished without difficulty. The patient                            tolerated the procedure well. Scope In: Scope Out: Findings:                 Esophagogastric landmarks were identified: the                            Z-line was found at 44 cm, the gastroesophageal                            junction was found at 44 cm and the upper extent of                            the gastric folds was found at 44 cm from the                            incisors.                           The exam of the esophagus was otherwise normal. No                            obvious stenosis / stricture noted.                           A guidewire was placed and the scope was withdrawn.                            Empiric dilation was performed in the entire                            esophagus with a Savary dilator with no resistance  at 17 mm and 18 mm. Relook endoscopy did not show                            any mucosal wrents.                           The entire examined stomach was normal.                           The duodenal bulb and second portion of the                            duodenum were normal. Complications:            No immediate complications. Estimated blood loss:                            Minimal. Estimated Blood Loss:     Estimated blood loss was minimal. Impression:               - Esophagogastric landmarks identified.                           - Normal esophagus - empiric dilation to 87mm                            performed.                           - Normal stomach.                           - Normal duodenal bulb and second portion of the                            duodenum. Recommendation:           - Patient has a contact number available for                            emergencies. The signs and symptoms of potential                            delayed complications were  discussed with the                            patient. Return to normal activities tomorrow.                            Written discharge instructions were provided to the                            patient.                           - Resume previous diet.                           -  Continue present medications.                           - Resume plavix in 3 days per colonoscopy note                           - Await course following dilation. If symptoms                            persist recommend esophageal manometry Jemeka Wagler P. Tresten Pantoja MD, MD 11/18/2016 5:03:33 PM This report has been signed electronically.

## 2016-11-18 NOTE — Progress Notes (Signed)
Report given to PACU, vss 

## 2016-11-19 ENCOUNTER — Telehealth: Payer: Self-pay | Admitting: *Deleted

## 2016-11-19 NOTE — Telephone Encounter (Signed)
  Follow up Call-  Call back number 11/18/2016  Post procedure Call Back phone  # [phone]  Permission to leave phone message Yes  Some recent data might be hidden     Patient questions:  Do you have a fever, pain , or abdominal swelling? No. Pain Score  0 *  Have you tolerated food without any problems? Yes.    Have you been able to return to your normal activities? Yes.    Do you have any questions about your discharge instructions: Diet   No. Medications  No. Follow up visit  No.  Do you have questions or concerns about your Care? No.  Actions: * If pain score is 4 or above: No action needed, pain <4.

## 2016-11-25 ENCOUNTER — Encounter: Payer: Self-pay | Admitting: Gastroenterology

## 2016-12-11 ENCOUNTER — Other Ambulatory Visit: Payer: Self-pay | Admitting: Family Medicine

## 2016-12-11 ENCOUNTER — Other Ambulatory Visit: Payer: Self-pay | Admitting: Cardiovascular Disease

## 2016-12-28 DIAGNOSIS — N401 Enlarged prostate with lower urinary tract symptoms: Secondary | ICD-10-CM | POA: Diagnosis not present

## 2016-12-28 DIAGNOSIS — R339 Retention of urine, unspecified: Secondary | ICD-10-CM | POA: Diagnosis not present

## 2016-12-28 DIAGNOSIS — N4340 Spermatocele of epididymis, unspecified: Secondary | ICD-10-CM | POA: Diagnosis not present

## 2016-12-28 DIAGNOSIS — N138 Other obstructive and reflux uropathy: Secondary | ICD-10-CM | POA: Diagnosis not present

## 2017-01-11 ENCOUNTER — Ambulatory Visit (INDEPENDENT_AMBULATORY_CARE_PROVIDER_SITE_OTHER): Payer: Medicare Other | Admitting: Family Medicine

## 2017-01-11 VITALS — BP 146/70 | HR 64 | Temp 97.8°F | Resp 16 | Wt 233.0 lb

## 2017-01-11 DIAGNOSIS — E7800 Pure hypercholesterolemia, unspecified: Secondary | ICD-10-CM

## 2017-01-11 DIAGNOSIS — Z1159 Encounter for screening for other viral diseases: Secondary | ICD-10-CM | POA: Diagnosis not present

## 2017-01-11 DIAGNOSIS — E1121 Type 2 diabetes mellitus with diabetic nephropathy: Secondary | ICD-10-CM

## 2017-01-11 DIAGNOSIS — I10 Essential (primary) hypertension: Secondary | ICD-10-CM

## 2017-01-11 LAB — POCT UA - MICROALBUMIN: Microalbumin Ur, POC: 100 mg/L

## 2017-01-11 NOTE — Progress Notes (Signed)
Godwin Tedesco [facility]  MRN: [id number] DOB: [birth year]  Subjective:  HPI   The patient is a 71 year old male who presents for follow up of his hypertension and diabetes.  His last visit was on 09/21/16.  On his last visit his blood pressure was 152/72.  No changes were made.  His last A1C was on 09/07/16 and it was 6.0.    The patient is due for his diabetic foot exam, urine microalbumin, TSH, A1C, Lipids, Met C and he has also never had a Hep C screening.  The patient has a BMI of 34.85.  Patient Active Problem List   Diagnosis Date Noted  . Dyspnea, + diastolic dysfunction, but pt improved without diuresis, may have been angina, but no MI. 07/13/2016  . Atherosclerosis of native arteries of extremity with intermittent claudication ([place]) 06/25/2016  . Acute renal failure superimposed on stage 3 chronic kidney disease ([place]) 10/07/2015  . Dilated cardiomyopathy ([place]) 10/07/2015  . Coronary artery disease involving native coronary artery of native heart with unstable angina pectoris ([place])   . Allergic rhinitis 08/19/2015  . NSTEMI (non-ST elevated myocardial infarction) ([place]) 12/13/2014  . Absolute anemia 10/04/2014  . AA (aortic aneurysm) ([place]) 10/04/2014  . Benign enlargement of prostate 10/04/2014  . Arteriosclerosis of coronary artery 10/04/2014  . Diabetes mellitus, type 2 ([place]) 10/04/2014  . Acid reflux 10/04/2014  . 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 HLD (hyperlipidemia) 10/04/2014  . BP (high blood pressure) 10/04/2014  . Osteoarthrosis 10/04/2014  . Adult BMI 30+ 10/04/2014  . 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 Chest pain, atypical 12/30/2012  . Urge incontinence of urine 10/24/2012  . 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 CKD (chronic kidney disease) stage 3, GFR 30-59 ml/min   . Abdominal pain 11/09/2010  . Shortness of breath 11/12/2009  . Renal artery stenosis    . Mixed hyperlipidemia  09/04/2008  . History of redo bypass grafting 09/04/2008  . Diabetes mellitus with nephropathy [facility])     Past Medical History:  Diagnosis Date  . Acute renal failure superimposed on stage 3 chronic kidney disease ([place]) 10/07/2015  . Arthritis    "hips; back" (12/13/2014)  . CAD (coronary artery disease) 2007   a. s/p CABG- IMA-LAD, VG-Cx, VG-RCA, VG-diag in 1999. B. sp redo CABG- VG-OM, VG-RCA in 2007 due to VG disease. c. NSTEMI 11/2014 s/p DES to SVG-OM from the Y graft.d. PTCA/DES x 1 distal body of SVG to Diagonal.09/2015  . CHF (congestive heart failure) ([place])   . Chronic lower back pain   . CKD (chronic kidney disease), stage IV (HCC)    Creatinine 1.9 to 2.2  . COPD (chronic obstructive pulmonary disease) ([place])   . Deafness in left ear   . Degenerative disc disease, lumbar   . Dilated cardiomyopathy ([place]) 10/07/2015  . Dysrhythmia    bigeminy pvcs  . Emphysema   . Esophageal stricture 07/02/1998   EGD  . GERD (gastroesophageal reflux disease)   . History of gout    "last flareup was in 2007" (12/13/2014)  . History of hiatal hernia   . Hyperlipidemia   . Hypertension   . Ischemic cardiomyopathy 2006   EF 40% to 50% by 2D echo in 2006;  Echo 12/31/12: Mild LVH, EF 50-55%, normal wall motion.   . Renal artery stenosis ([place])   . Type II diabetes mellitus (HCC)    Diet control   . Walking pneumonia  5038'U    Social History   Social History  . Marital status: Married    Spouse name: N/A  . Number of children: 2  . Years of education: N/A   Occupational History  . Retired    Social History Main Topics  . Smoking status: Former Smoker    Packs/day: 3.00    Years: 20.00    Types: Cigarettes    Quit date: 07/25/1986  . Smokeless tobacco: Never Used  . Alcohol use No  . Drug use: No  . Sexual activity: Not Currently   Other Topics Concern  . 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    Outpatient Encounter Prescriptions as of 01/11/2017    Medication Sig  . acetaminophen (TYLENOL) 650 MG CR tablet Take 1,300 mg by mouth daily as needed for pain.  [name] allopurinol (ZYLOPRIM) 100 MG tablet TAKE 1 TABLET BY MOUTH AT  BEDTIME  . amLODipine (NORVASC) 5 MG tablet TAKE 1 TABLET BY MOUTH  DAILY  . aspirin 81 MG tablet Take 81 mg by mouth every morning.   [name] atorvastatin (LIPITOR) 40 MG tablet Take 1 tablet (40 mg total) by mouth daily.  . carvedilol (COREG) 12.5 MG tablet Take 1 tablet (12.5 mg total) by mouth 2 (two) times daily with a meal.  . clopidogrel (PLAVIX) 75 MG tablet TAKE 1 TABLET BY MOUTH  DAILY  . enalapril (VASOTEC) 2.5 MG tablet Take 2.5 mg by mouth daily.  . magnesium oxide (MAG-OX) 400 (241.3 Mg) MG tablet TAKE 1 TABLET BY MOUTH TWO TIMES DAILY.  . Multiple Vitamin (MULTIVITAMIN) tablet Take 1 tablet by mouth every morning.   . nitroGLYCERIN (NITROSTAT) 0.4 MG SL tablet Place 0.4 mg under the tongue every 5 (five) minutes as needed for chest pain (Up to 3 times).  . pantoprazole (PROTONIX) 40 MG tablet Take 1 tablet (40 mg total) by mouth daily.  . ranitidine (ZANTAC) 150 MG tablet Take 1 tablet by mouth at bedtime.  . sodium bicarbonate 650 MG tablet Take 650 mg by mouth 2 (two) times daily.   Facility-Administered Encounter Medications as of 01/11/2017  Medication  . 0.9 %  sodium chloride infusion    Allergies  Allergen Reactions  . Ciprofloxacin     GI upset  . Hydrochlorothiazide Other (See Comments)    Dehydration  . Hydrocodone     Stomach upset  . Hydrocodone-Acetaminophen     Stomach upset  . Hydrocodone-Acetaminophen Nausea Only  . Loratadine     Other reaction(s): Unknown  . Sulfa Antibiotics Other (See Comments)    Cannot recall  . Sulfacetamide Sodium Other (See Comments)    Cannot recall  . Sulfasalazine     Other reaction(s): Other (See Comments) Cannot recall  . Penicillins Hives and Rash    Has patient had a PCN reaction causing immediate rash, facial/tongue/throat swelling, SOB or  lightheadedness with hypotension: YES Has patient had a PCN reaction causing severe rash involving mucus membranes or skin necrosis: NO Has patient had a PCN reaction that required hospitalization NO Has patient had a PCN reaction occurring within the last 10 years:NO If all of the above answers are "NO", then may proceed with Cephalosporin use.    Review of Systems  Constitutional: Negative for fever and malaise/fatigue.  Eyes: Negative.   Respiratory: Negative for cough, shortness of breath and wheezing.   Cardiovascular: Negative for chest pain, palpitations and leg swelling.  Gastrointestinal: Negative.   Skin:  Negative.   Neurological: Negative for weakness.  Endo/Heme/Allergies: Negative.   Psychiatric/Behavioral: Negative.     Objective:  BP (!) 146/70 (BP Location: Right Arm, Patient Position: Sitting, Cuff Size: Normal)   Pulse 64   Temp 97.8 F (36.6 C) (Oral)   Resp 16   Wt 233 lb (105.7 kg)   BMI 34.41 kg/m   Physical Exam  Constitutional: He is oriented to person, place, and time and well-developed, well-nourished, and in no distress.  Trunkal obesity.  HENT:  Head: Normocephalic and atraumatic.  Eyes: Conjunctivae are normal. No scleral icterus.  Neck: No thyromegaly present.  Cardiovascular: Normal rate, regular rhythm and normal heart sounds.   Pulmonary/Chest: Effort normal and breath sounds normal.  Abdominal: Soft.  Neurological: He is alert and oriented to person, place, and time. Gait normal. GCS score is 15.  Skin: Skin is warm and dry.  Psychiatric: Mood, memory, affect and judgment normal.   Diabetic Foot Exam - Simple   Simple Foot Form Diabetic Foot exam was performed with the following findings:  Yes 01/11/2017  5:13 PM  Visual Inspection No deformities, no ulcerations, no other skin breakdown bilaterally:  Yes Sensation Testing Intact to touch and monofilament testing bilaterally:  Yes Pulse Check Posterior Tibialis and Dorsalis pulse  intact bilaterally:  Yes Comments    Assessment and Plan :  1. Essential hypertension - CBC with Differential/Platelet - Comprehensive metabolic panel - TSH  2. Diabetes mellitus with nephropathy (HCC) - Hemoglobin A1c - POCT UA - Microalbumin--100  3. Pure hypercholesterolemia - Lipid Panel With LDL/HDL Ratio  4. Need for hepatitis C screening test - Hepatitis C Antibody 5.Diabetic nephropathy On ACEI. I have done the exam and reviewed the chart and it is accurate to the best of my knowledge. Development worker, community has been used and  any errors in dictation or transcription are unintentional. Miguel Aschoff M.D. [facility]

## 2017-01-13 DIAGNOSIS — Z1159 Encounter for screening for other viral diseases: Secondary | ICD-10-CM | POA: Diagnosis not present

## 2017-01-13 DIAGNOSIS — E7800 Pure hypercholesterolemia, unspecified: Secondary | ICD-10-CM | POA: Diagnosis not present

## 2017-01-13 DIAGNOSIS — E1121 Type 2 diabetes mellitus with diabetic nephropathy: Secondary | ICD-10-CM | POA: Diagnosis not present

## 2017-01-13 DIAGNOSIS — I10 Essential (primary) hypertension: Secondary | ICD-10-CM | POA: Diagnosis not present

## 2017-01-14 LAB — CBC WITH DIFFERENTIAL/PLATELET
Basophils Absolute: 0.1 10*3/uL (ref 0.0–0.2)
Basos: 1 %
EOS (ABSOLUTE): 0.4 10*3/uL (ref 0.0–0.4)
Eos: 6 %
Hematocrit: 39.4 % (ref 37.5–51.0)
Hemoglobin: 13 g/dL (ref 13.0–17.7)
IMMATURE GRANULOCYTES: 1 %
Immature Grans (Abs): 0 10*3/uL (ref 0.0–0.1)
LYMPHS ABS: 1.6 10*3/uL (ref 0.7–3.1)
Lymphs: 24 %
MCH: 29.4 pg (ref 26.6–33.0)
MCHC: 33 g/dL (ref 31.5–35.7)
MCV: 89 fL (ref 79–97)
MONOS ABS: 0.6 10*3/uL (ref 0.1–0.9)
Monocytes: 9 %
NEUTROS PCT: 59 %
Neutrophils Absolute: 4 10*3/uL (ref 1.4–7.0)
PLATELETS: 162 10*3/uL (ref 150–379)
RBC: 4.42 x10E6/uL (ref 4.14–5.80)
RDW: 15.5 % — AB (ref 12.3–15.4)
WBC: 6.6 10*3/uL (ref 3.4–10.8)

## 2017-01-14 LAB — COMPREHENSIVE METABOLIC PANEL
ALK PHOS: 154 IU/L — AB (ref 39–117)
ALT: 15 IU/L (ref 0–44)
AST: 16 IU/L (ref 0–40)
Albumin/Globulin Ratio: 2 (ref 1.2–2.2)
Albumin: 4.3 g/dL (ref 3.5–4.8)
BUN/Creatinine Ratio: 17 (ref 10–24)
BUN: 35 mg/dL — ABNORMAL HIGH (ref 8–27)
Bilirubin Total: 0.7 mg/dL (ref 0.0–1.2)
CO2: 20 mmol/L (ref 20–29)
CREATININE: 2.08 mg/dL — AB (ref 0.76–1.27)
Calcium: 9.6 mg/dL (ref 8.6–10.2)
Chloride: 110 mmol/L — ABNORMAL HIGH (ref 96–106)
GFR calc Af Amer: 35 mL/min/{1.73_m2} — ABNORMAL LOW (ref >59)
GFR calc non Af Amer: 31 mL/min/{1.73_m2} — ABNORMAL LOW (ref >59)
GLOBULIN, TOTAL: 2.1 g/dL (ref 1.5–4.5)
GLUCOSE: 132 mg/dL — AB (ref 65–99)
Potassium: 5.3 mmol/L — ABNORMAL HIGH (ref 3.5–5.2)
SODIUM: 143 mmol/L (ref 134–144)
Total Protein: 6.4 g/dL (ref 6.0–8.5)

## 2017-01-14 LAB — TSH: TSH: 4.15 u[IU]/mL (ref 0.450–4.500)

## 2017-01-14 LAB — LIPID PANEL WITH LDL/HDL RATIO
CHOLESTEROL TOTAL: 138 mg/dL (ref 100–199)
HDL: 40 mg/dL (ref >39)
LDL Calculated: 74 mg/dL (ref 0–99)
LDl/HDL Ratio: 1.9 ratio (ref 0.0–3.6)
Triglycerides: 118 mg/dL (ref 0–149)
VLDL Cholesterol Cal: 24 mg/dL (ref 5–40)

## 2017-01-14 LAB — HEPATITIS C ANTIBODY

## 2017-01-14 LAB — HEMOGLOBIN A1C
ESTIMATED AVERAGE GLUCOSE: 123 mg/dL
HEMOGLOBIN A1C: 5.9 % — AB (ref 4.8–5.6)

## 2017-01-21 ENCOUNTER — Ambulatory Visit (INDEPENDENT_AMBULATORY_CARE_PROVIDER_SITE_OTHER): Payer: Medicare Other

## 2017-01-21 VITALS — BP 150/78 | HR 60 | Temp 98.5°F | Ht 69.0 in | Wt 234.8 lb

## 2017-01-21 DIAGNOSIS — Z0000 Encounter for general adult medical examination without abnormal findings: Secondary | ICD-10-CM | POA: Diagnosis not present

## 2017-01-21 NOTE — Progress Notes (Signed)
Subjective:   Ronald [name] is a 71 y.o. male who presents for Medicare Annual/Subsequent preventive examination.  Review of Systems:  N/A  Cardiac Risk Factors include: advanced age (>46men, >61 women);dyslipidemia;diabetes mellitus;hypertension;male gender;obesity (BMI >30kg/m2)     Objective:    Vitals: BP (!) 150/78 (BP Location: Left Arm)   Pulse 60   Temp 98.5 F (36.9 C) (Oral)   Ht 5\' 9"  (1.753 m)   Wt 234 lb 12.8 oz (106.5 kg)   BMI 34.67 kg/m   Body mass index is 34.67 kg/m.  Tobacco History  Smoking Status  . Former Smoker  . Packs/day: 3.00  . Years: 20.00  . Types: Cigarettes  . Quit date: 07/25/1986  Smokeless Tobacco  . Never Used     Counseling given: Not Answered   Past Medical History:  Diagnosis Date  . Acute renal failure superimposed on stage 3 chronic kidney disease ([place]) 10/07/2015  . Arthritis    "hips; back" (12/13/2014)  . CAD (coronary artery disease) 2007   a. s/p CABG- IMA-LAD, VG-Cx, VG-RCA, VG-diag in 1999. B. sp redo CABG- VG-OM, VG-RCA in 2007 due to VG disease. c. NSTEMI 11/2014 s/p DES to SVG-OM from the Y graft.d. PTCA/DES x 1 distal body of SVG to Diagonal.09/2015  . CHF (congestive heart failure) ([place])   . Chronic lower back pain   . CKD (chronic kidney disease), stage IV (H[facility])    Creatinine 1.9 to 2.2  . COPD (chronic obstructive pulmonary disease) ([place])   . Deafness in left ear   . Degenerative disc disease, lumbar   . Dilated cardiomyopathy ([place]) 10/07/2015  . Dysrhythmia    bigeminy pvcs  . Emphysema   . Esophageal stricture 07/02/1998   EGD  . GERD (gastroesophageal reflux disease)   . History of gout    "last flareup was in 2007" (12/13/2014)  . History of hiatal hernia   . Hyperlipidemia   . Hypertension   . Ischemic cardiomyopathy 2006   EF 40% to 50% by 2D echo in 2006;  Echo 12/31/12: Mild LVH, EF 50-55%, normal wall motion.   . Renal artery stenosis ([place])   . Type II diabetes mellitus (HCC)    Diet control   .  Walking pneumonia 1990's   Past Surgical History:  Procedure Laterality Date  . CARDIAC CATHETERIZATION  "several"  . CARDIAC CATHETERIZATION N/A 12/13/2014   Procedure: Left Heart Cath and Coronary Angiography;  Surgeon: Jettie Booze, MD;  Location: [facility];  Service: Cardiovascular;  Laterality: N/A;  . CARDIAC CATHETERIZATION  12/13/2014   Procedure: Coronary Stent Intervention;  Surgeon: Jettie Booze, MD;  Location: [facility];  Service: Cardiovascular;;  . CARDIAC CATHETERIZATION N/A 10/07/2015   Procedure: Left Heart Cath and Cors/Grafts Angiography;  Surgeon: Burnell Blanks, MD;  Location: [facility];  Service: Cardiovascular;  Laterality: N/A;  . CARDIAC CATHETERIZATION N/A 10/07/2015   Procedure: Coronary Stent Intervention;  Surgeon: Burnell Blanks, MD;  Location: [facility];  Service: Cardiovascular;  Laterality: N/A;  . CORONARY ANGIOPLASTY  "several"  . CORONARY ANGIOPLASTY WITH STENT PLACEMENT  2005; 12/13/2014   "2; 1"  . CORONARY ARTERY BYPASS GRAFT  1996   CABG X5  . CORONARY ARTERY BYPASS GRAFT  March 2007   CABG X3  . ESOPHAGOGASTRODUODENOSCOPY (EGD) WITH ESOPHAGEAL DILATION  2000  . GREEN LIGHT LASER TURP (TRANSURETHRAL RESECTION OF PROSTATE  2000's   "not cancerous"  . HERNIA REPAIR    .  LAPAROSCOPIC CHOLECYSTECTOMY    . LUNG SURGERY  1996   "S/P CABG, had to put staple in lung after it had collapsed"  . UMBILICAL HERNIA REPAIR     w/chole   Family History  Problem Relation Age of Onset  . Heart attack Mother        MI  . Stroke Mother   . Heart disease Mother   . Hypertension Mother   . Hyperlipidemia Mother   . Heart disease Father   . Rheumatic fever Father   . Colon cancer Neg Hx    History  Sexual Activity  . Sexual activity: Not Currently    Outpatient Encounter Prescriptions as of 01/21/2017  Medication Sig  . acetaminophen (TYLENOL) 650 MG CR tablet Take 1,300 mg by mouth daily as  needed for pain.  [name] allopurinol (ZYLOPRIM) 100 MG tablet TAKE 1 TABLET BY MOUTH AT  BEDTIME  . amLODipine (NORVASC) 5 MG tablet TAKE 1 TABLET BY MOUTH  DAILY  . aspirin 81 MG tablet Take 81 mg by mouth every morning.   [name] atorvastatin (LIPITOR) 40 MG tablet Take 1 tablet (40 mg total) by mouth daily.  . carvedilol (COREG) 12.5 MG tablet Take 1 tablet (12.5 mg total) by mouth 2 (two) times daily with a meal.  . clopidogrel (PLAVIX) 75 MG tablet TAKE 1 TABLET BY MOUTH  DAILY  . enalapril (VASOTEC) 2.5 MG tablet Take 2.5 mg by mouth daily.  . magnesium oxide (MAG-OX) 400 (241.3 Mg) MG tablet TAKE 1 TABLET BY MOUTH TWO TIMES DAILY.  . Multiple Vitamin (MULTIVITAMIN) tablet Take 1 tablet by mouth every morning.   . nitroGLYCERIN (NITROSTAT) 0.4 MG SL tablet Place 0.4 mg under the tongue every 5 (five) minutes as needed for chest pain (Up to 3 times).  . pantoprazole (PROTONIX) 40 MG tablet Take 1 tablet (40 mg total) by mouth daily.  . ranitidine (ZANTAC) 150 MG tablet Take 1 tablet by mouth at bedtime.  . sodium bicarbonate 650 MG tablet Take 650 mg by mouth 2 (two) times daily.   Facility-Administered Encounter Medications as of 01/21/2017  Medication  . 0.9 %  sodium chloride infusion    Activities of Daily Living In your present state of health, do you have any difficulty performing the following activities: 01/21/2017 07/13/2016  Hearing? Y [place]? Y N  Difficulty concentrating or making decisions? N N  Walking or climbing stairs? Y N  Comment - -  Dressing or bathing? N N  Doing errands, shopping? N N  Preparing Food and eating ? N -  Using the Toilet? N -  In the past six months, have you accidently leaked urine? N -  Do you have problems with loss of bowel control? N -  Managing your Medications? N -  Comment - -  Managing your Finances? N -  Comment - -  Housekeeping or managing your Housekeeping? N -  Some recent data might be hidden    Patient Care  Team: [name]., MD as PCP - General (Unknown Physician Specialty) Kem Parkinson, MD as Consulting Physician (Ophthalmology) Burnell Blanks, MD as Consulting Physician (Cardiology) Murlean Iba, MD as Consulting Physician (Internal Medicine) Murrell Redden, MD as Consulting Physician (Urology)   Assessment:     Exercise Activities and Dietary recommendations Current Exercise Habits: The patient does not participate in regular exercise at present, Exercise limited by: orthopedic condition(s)  Goals    . Increase water  intake          Starting 04/16/16, I will increase my water intake to 4-5 glasses a day.      Fall Risk Fall Risk  01/21/2017 04/16/2016 01/13/2015 12/03/2014  Falls in the past year? No No No No  Risk for fall due to : - - - History of fall(s);Other (Comment)  Risk for fall due to: Comment - - - tripped, but not fallen   Depression Screen PHQ 2/9 Scores 01/21/2017 04/16/2016 12/03/2014  PHQ - 2 Score 0 0 3  PHQ- 9 Score - - 9  Exception Documentation - - Medical reason    Cognitive Function- Pt declined screening today.      6CIT Screen 04/16/2016  What Year? 0 points  What month? 0 points  What time? 0 points  Count back from 20 0 points  Months in reverse 4 points  Repeat phrase 10 points  Total Score 14    Immunization History  Administered Date(s) Administered  . Influenza, High Dose Seasonal PF 03/18/2015, 03/02/2016  . Pneumococcal Conjugate-13 12/25/2013  . Pneumococcal Polysaccharide-23 03/04/2009  . Td 10/10/2003, 04/16/2016   Screening Tests Health Maintenance  Topic Date Due  . INFLUENZA VACCINE  12/29/2016  . HEMOGLOBIN A1C  07/16/2017  . OPHTHALMOLOGY EXAM  09/06/2017  . FOOT EXAM  01/11/2018  . COLONOSCOPY  11/19/2019  . TETANUS/TDAP  04/16/2026  . Hepatitis C Screening  Completed  . PNA vac Low Risk Adult  Completed      Plan:  I have personally reviewed and addressed the Medicare Annual Wellness  questionnaire and have noted the following in the patient's chart:  A. Medical and social history B. Use of alcohol, tobacco or illicit drugs  C. Current medications and supplements D. Functional ability and status E.  Nutritional status F.  Physical activity G. Advance directives H. List of other physicians I.  Hospitalizations, surgeries, and ER visits in previous 12 months J.  [place] such as hearing and vision if needed, cognitive and depression L. Referrals and appointments - none  In addition, I have reviewed and discussed with patient certain preventive protocols, quality metrics, and best practice recommendations. A written personalized care plan for preventive services as well as general preventive health recommendations were provided to patient.  See attached scanned questionnaire for additional information.   Signed,  Fabio Neighbors, LPN Nurse Health Advisor   MD Recommendations: None. Pt declined the flu vaccine today.

## 2017-01-21 NOTE — Patient Instructions (Signed)
Ronald [name] , Thank you for taking time to come for your Medicare Wellness Visit. I appreciate your ongoing commitment to your health goals. Please review the following plan we discussed and let me know if I can assist you in the future.   Screening recommendations/referrals: Colonoscopy: up to date Recommended yearly ophthalmology/optometry visit for glaucoma screening and checkup Recommended yearly dental visit for hygiene and checkup  Vaccinations: Influenza vaccine: declined Pneumococcal vaccine: completed series Tdap vaccine: up to date, due 03/2026 Shingles vaccine: declined  Advanced directives: Advance directive discussed with you today. I have provided a copy for you to complete at home and have notarized. Once this is complete please bring a copy in to our office so we can scan it into your chart.  Conditions/risks identified: Recommend to continue drinking 4-6 glasses of water a day.  Next appointment: 05/18/17  Preventive Care 65 Years and Older, Male Preventive care refers to lifestyle choices and visits with your health care provider that can promote health and wellness. What does preventive care include?  A yearly physical exam. This is also called an annual well check.  Dental exams once or twice a year.  Routine eye exams. Ask your health care provider how often you should have your eyes checked.  Personal lifestyle choices, including:  Daily care of your teeth and gums.  Regular physical activity.  Eating a healthy diet.  Avoiding tobacco and drug use.  Limiting alcohol use.  Practicing safe sex.  Taking low doses of aspirin every day.  Taking vitamin and mineral supplements as recommended by your health care provider. What happens during an annual well check? The services and screenings done by your health care provider during your annual well check will depend on your age, overall health, lifestyle risk factors, and family history of  disease. Counseling  Your health care provider may ask you questions about your:  Alcohol use.  Tobacco use.  Drug use.  Emotional well-being.  Home and relationship well-being.  Sexual activity.  Eating habits.  History of falls.  Memory and ability to understand (cognition).  Work and work Statistician. Screening  You may have the following tests or measurements:  Height, weight, and BMI.  Blood pressure.  Lipid and cholesterol levels. These may be checked every 5 years, or more frequently if you are over 95 years old.  Skin check.  Lung cancer screening. You may have this screening every year starting at age 66 if you have a 30-pack-year history of smoking and currently smoke or have quit within the past 15 years.  Fecal occult blood test (FOBT) of the stool. You may have this test every year starting at age 15.  Flexible sigmoidoscopy or colonoscopy. You may have a sigmoidoscopy every 5 years or a colonoscopy every 10 years starting at age 25.  Prostate cancer screening. Recommendations will vary depending on your family history and other risks.  Hepatitis C blood test.  Hepatitis B blood test.  Sexually transmitted disease (STD) testing.  Diabetes screening. This is done by checking your blood sugar (glucose) after you have not eaten for a while (fasting). You may have this done every 1-3 years.  Abdominal aortic aneurysm (AAA) screening. You may need this if you are a current or former smoker.  Osteoporosis. You may be screened starting at age 76 if you are at high risk. Talk with your health care provider about your test results, treatment options, and if necessary, the need for more tests. Vaccines  Your  health care provider may recommend certain vaccines, such as:  Influenza vaccine. This is recommended every year.  Tetanus, diphtheria, and acellular pertussis (Tdap, Td) vaccine. You may need a Td booster every 10 years.  Zoster vaccine. You may  need this after age 61.  Pneumococcal 13-valent conjugate (PCV13) vaccine. One dose is recommended after age 47.  Pneumococcal polysaccharide (PPSV23) vaccine. One dose is recommended after age 73. Talk to your health care provider about which screenings and vaccines you need and how often you need them. This information is not intended to replace advice given to you by your health care provider. Make sure you discuss any questions you have with your health care provider. Document Released: 06/13/2015 Document Revised: 02/04/2016 Document Reviewed: 03/18/2015 Elsevier Interactive Patient Education  2017 [place] Prevention in the Home Falls can cause injuries. They can happen to people of all ages. There are many things you can do to make your home safe and to help prevent falls. What can I do on the outside of my home?  Regularly fix the edges of walkways and driveways and fix any cracks.  Remove anything that might make you trip as you walk through a door, such as a raised step or threshold.  Trim any bushes or trees on the path to your home.  Use bright outdoor lighting.  Clear any walking paths of anything that might make someone trip, such as rocks or tools.  Regularly check to see if handrails are loose or broken. Make sure that both sides of any steps have handrails.  Any raised decks and porches should have guardrails on the edges.  Have any leaves, snow, or ice cleared regularly.  Use sand or salt on walking paths during winter.  Clean up any spills in your garage right away. This includes oil or grease spills. What can I do in the bathroom?  Use night lights.  Install grab bars by the toilet and in the tub and shower. Do not use towel bars as grab bars.  Use non-skid mats or decals in the tub or shower.  If you need to sit down in the shower, use a plastic, non-slip stool.  Keep the floor dry. Clean up any water that spills on the floor as soon as it  happens.  Remove soap buildup in the tub or shower regularly.  Attach bath mats securely with double-sided non-slip rug tape.  Do not have throw rugs and other things on the floor that can make you trip. What can I do in the bedroom?  Use night lights.  Make sure that you have a light by your bed that is easy to reach.  Do not use any sheets or blankets that are too big for your bed. They should not hang down onto the floor.  Have a firm chair that has side arms. You can use this for support while you get dressed.  Do not have throw rugs and other things on the floor that can make you trip. What can I do in the kitchen?  Clean up any spills right away.  Avoid walking on wet floors.  Keep items that you use a lot in easy-to-reach places.  If you need to reach something above you, use a strong step stool that has a grab bar.  Keep electrical cords out of the way.  Do not use floor polish or wax that makes floors slippery. If you must use wax, use non-skid floor wax.  Do not  have throw rugs and other things on the floor that can make you trip. What can I do with my stairs?  Do not leave any items on the stairs.  Make sure that there are handrails on both sides of the stairs and use them. Fix handrails that are broken or loose. Make sure that handrails are as long as the stairways.  Check any carpeting to make sure that it is firmly attached to the stairs. Fix any carpet that is loose or worn.  Avoid having throw rugs at the top or bottom of the stairs. If you do have throw rugs, attach them to the floor with carpet tape.  Make sure that you have a light switch at the top of the stairs and the bottom of the stairs. If you do not have them, ask someone to add them for you. What else can I do to help prevent falls?  Wear shoes that:  Do not have high heels.  Have rubber bottoms.  Are comfortable and fit you well.  Are closed at the toe. Do not wear sandals.  If you  use a stepladder:  Make sure that it is fully opened. Do not climb a closed stepladder.  Make sure that both sides of the stepladder are locked into place.  Ask someone to hold it for you, if possible.  Clearly mark and make sure that you can see:  Any grab bars or handrails.  First and last steps.  Where the edge of each step is.  Use tools that help you move around (mobility aids) if they are needed. These include:  Canes.  Walkers.  Scooters.  Crutches.  Turn on the lights when you go into a dark area. Replace any light bulbs as soon as they burn out.  Set up your furniture so you have a clear path. Avoid moving your furniture around.  If any of your floors are uneven, fix them.  If there are any pets around you, be aware of where they are.  Review your medicines with your doctor. Some medicines can make you feel dizzy. This can increase your chance of falling. Ask your doctor what other things that you can do to help prevent falls. This information is not intended to replace advice given to you by your health care provider. Make sure you discuss any questions you have with your health care provider. Document Released: 03/13/2009 Document Revised: 10/23/2015 Document Reviewed: 06/21/2014 Elsevier Interactive Patient Education  2017 [employer].

## 2017-01-23 ENCOUNTER — Other Ambulatory Visit: Payer: Self-pay | Admitting: Family Medicine

## 2017-03-07 DIAGNOSIS — H353132 Nonexudative age-related macular degeneration, bilateral, intermediate dry stage: Secondary | ICD-10-CM | POA: Diagnosis not present

## 2017-03-25 ENCOUNTER — Other Ambulatory Visit: Payer: Self-pay | Admitting: Family Medicine

## 2017-04-04 DIAGNOSIS — I10 Essential (primary) hypertension: Secondary | ICD-10-CM | POA: Diagnosis not present

## 2017-04-04 DIAGNOSIS — I701 Atherosclerosis of renal artery: Secondary | ICD-10-CM | POA: Diagnosis not present

## 2017-04-04 DIAGNOSIS — N183 Chronic kidney disease, stage 3 (moderate): Secondary | ICD-10-CM | POA: Diagnosis not present

## 2017-04-04 DIAGNOSIS — E1129 Type 2 diabetes mellitus with other diabetic kidney complication: Secondary | ICD-10-CM | POA: Diagnosis not present

## 2017-04-04 DIAGNOSIS — E872 Acidosis: Secondary | ICD-10-CM | POA: Diagnosis not present

## 2017-04-08 DIAGNOSIS — I10 Essential (primary) hypertension: Secondary | ICD-10-CM | POA: Diagnosis not present

## 2017-04-08 DIAGNOSIS — I701 Atherosclerosis of renal artery: Secondary | ICD-10-CM | POA: Diagnosis not present

## 2017-04-08 DIAGNOSIS — N183 Chronic kidney disease, stage 3 (moderate): Secondary | ICD-10-CM | POA: Diagnosis not present

## 2017-04-08 DIAGNOSIS — E872 Acidosis: Secondary | ICD-10-CM | POA: Diagnosis not present

## 2017-04-08 DIAGNOSIS — N179 Acute kidney failure, unspecified: Secondary | ICD-10-CM | POA: Diagnosis not present

## 2017-04-18 DIAGNOSIS — M48062 Spinal stenosis, lumbar region with neurogenic claudication: Secondary | ICD-10-CM | POA: Diagnosis not present

## 2017-04-18 DIAGNOSIS — M5412 Radiculopathy, cervical region: Secondary | ICD-10-CM | POA: Diagnosis not present

## 2017-04-18 DIAGNOSIS — M5416 Radiculopathy, lumbar region: Secondary | ICD-10-CM | POA: Diagnosis not present

## 2017-05-03 DIAGNOSIS — I10 Essential (primary) hypertension: Secondary | ICD-10-CM | POA: Diagnosis not present

## 2017-05-03 DIAGNOSIS — E1129 Type 2 diabetes mellitus with other diabetic kidney complication: Secondary | ICD-10-CM | POA: Diagnosis not present

## 2017-05-03 DIAGNOSIS — N183 Chronic kidney disease, stage 3 (moderate): Secondary | ICD-10-CM | POA: Diagnosis not present

## 2017-05-03 DIAGNOSIS — I701 Atherosclerosis of renal artery: Secondary | ICD-10-CM | POA: Diagnosis not present

## 2017-05-03 DIAGNOSIS — E872 Acidosis: Secondary | ICD-10-CM | POA: Diagnosis not present

## 2017-05-18 ENCOUNTER — Ambulatory Visit: Payer: Medicare Other | Admitting: Family Medicine

## 2017-05-26 ENCOUNTER — Ambulatory Visit (INDEPENDENT_AMBULATORY_CARE_PROVIDER_SITE_OTHER): Payer: Medicare Other | Admitting: Family Medicine

## 2017-05-26 ENCOUNTER — Encounter: Payer: Self-pay | Admitting: Family Medicine

## 2017-05-26 VITALS — BP 150/74 | HR 76 | Temp 97.6°F | Resp 16 | Wt 241.0 lb

## 2017-05-26 DIAGNOSIS — E782 Mixed hyperlipidemia: Secondary | ICD-10-CM

## 2017-05-26 DIAGNOSIS — E1121 Type 2 diabetes mellitus with diabetic nephropathy: Secondary | ICD-10-CM | POA: Diagnosis not present

## 2017-05-26 DIAGNOSIS — I119 Hypertensive heart disease without heart failure: Secondary | ICD-10-CM

## 2017-05-26 DIAGNOSIS — N183 Chronic kidney disease, stage 3 unspecified: Secondary | ICD-10-CM

## 2017-05-26 DIAGNOSIS — B354 Tinea corporis: Secondary | ICD-10-CM

## 2017-05-26 DIAGNOSIS — I2510 Atherosclerotic heart disease of native coronary artery without angina pectoris: Secondary | ICD-10-CM

## 2017-05-26 LAB — POCT GLYCOSYLATED HEMOGLOBIN (HGB A1C)
Est. average glucose Bld gHb Est-mCnc: 134
Hemoglobin A1C: 6.3

## 2017-05-26 MED ORDER — NYSTATIN 100000 UNIT/GM EX CREA: 1.0000 "application " | TOPICAL_CREAM | Freq: Two times a day (BID) | CUTANEOUS | 0 refills | Status: DC | PRN

## 2017-05-26 NOTE — Patient Instructions (Signed)
Try Robitussin 1 tsp am and 1 tsp pm

## 2017-05-26 NOTE — Progress Notes (Signed)
Patient: [name] Male    DOB: 24-Sep-1945   71 y.o.   MRN: [id number] Visit Date: 05/26/2017  Today's Provider: [name], MD   Chief Complaint  Patient presents with  . Hypertension    follow up  . Diabetes    follow up   Subjective:    HPI  Hypertension, follow-up:  BP Readings from Last 3 Encounters:  01/21/17 (!) 150/78  01/11/17 (!) 146/70  11/18/16 (!) 157/67    He was last seen for hypertension 4 months ago.  BP at that visit was 150/78. Management since that visit includes no changes. He reports good compliance with treatment. He is not having side effects.  He is not exercising. He is adherent to low salt diet.   Outside blood pressures are not being checked. He is experiencing dyspnea and lower extremity edema.  Patient denies chest pain, chest pressure/discomfort, claudication, exertional chest pressure/discomfort, irregular heart beat, near-syncope, orthopnea, palpitations, paroxysmal nocturnal dyspnea, syncope and tachypnea.   Cardiovascular risk factors include advanced age (older than 4 for men, 61 for women), diabetes mellitus, hypertension and male gender.  Use of agents associated with hypertension: NSAIDS.     Weight trend: increasing steadily Wt Readings from Last 3 Encounters:  01/21/17 234 lb 12.8 oz (106.5 kg)  01/11/17 233 lb (105.7 kg)  11/18/16 232 lb (105.2 kg)    Current diet: in general, an "unhealthy" diet  ------------------------------------------------------------------------  Diabetes Mellitus Type II, Follow-up:   Lab Results  Component Value Date   HGBA1C 5.9 (H) 01/13/2017   HGBA1C 6.0 09/07/2016   HGBA1C 5.9 03/02/2016    Last seen for diabetes 4 months ago.  Management since then includes no changes. He reports fair compliance with treatment. He is not having side effects.  Current symptoms include none and have been stable. Home blood sugar records: blood sugars not checked at  home  Episodes of hypoglycemia? no   Current Insulin Regimen: none Most Recent Eye Exam: 08/2016 Weight trend: increasing steadily Prior visit with dietician: no Current diet: in general, an "unhealthy" diet Current exercise: none  Pertinent Labs:    Component Value Date/Time   CHOL 138 01/13/2017 0914   TRIG 118 01/13/2017 0914   HDL 40 01/13/2017 0914   LDLCALC 74 01/13/2017 0914   CREATININE 2.08 (H) 01/13/2017 0914   CREATININE 2.44 (H) 11/24/2015 1546    Wt Readings from Last 3 Encounters:  01/21/17 234 lb 12.8 oz (106.5 kg)  01/11/17 233 lb (105.7 kg)  11/18/16 232 lb (105.2 kg)   Pt has GI side effects with any NSAID and prednisone. ------------------------------------------------------------------------     Allergies  Allergen Reactions  . Ciprofloxacin     GI upset  . Hydrochlorothiazide Other (See Comments)    Dehydration  . Hydrocodone     Stomach upset  . Hydrocodone-Acetaminophen     Stomach upset  . Hydrocodone-Acetaminophen Nausea Only  . Loratadine     Other reaction(s): Unknown  . Sulfa Antibiotics Other (See Comments)    Cannot recall  . Sulfacetamide Sodium Other (See Comments)    Cannot recall  . Sulfasalazine     Other reaction(s): Other (See Comments) Cannot recall  . Penicillins Hives and Rash    Has patient had a PCN reaction causing immediate rash, facial/tongue/throat swelling, SOB or lightheadedness with hypotension: YES Has patient had a PCN reaction causing severe rash involving mucus membranes or skin necrosis: NO Has patient had a PCN  reaction that required hospitalization NO Has patient had a PCN reaction occurring within the last 10 years:NO If all of the above answers are "NO", then may proceed with Cephalosporin use.     Current Outpatient Medications:  .  acetaminophen (TYLENOL) 650 MG CR tablet, Take 1,300 mg by mouth daily as needed for pain., Disp: , Rfl:  .  allopurinol (ZYLOPRIM) 100 MG tablet, TAKE 1 TABLET BY  MOUTH AT  BEDTIME, Disp: 90 tablet, Rfl: 3 .  amLODipine (NORVASC) 5 MG tablet, TAKE 1 TABLET BY MOUTH  DAILY (Patient taking differently: TAKE 1 TABLET BY MOUTH  TWO TIMES A DAY), Disp: 90 tablet, Rfl: 3 .  aspirin 81 MG tablet, Take 81 mg by mouth every morning. , Disp: , Rfl:  .  atorvastatin (LIPITOR) 40 MG tablet, Take 1 tablet (40 mg total) by mouth daily., Disp: 90 tablet, Rfl: 1 .  carvedilol (COREG) 12.5 MG tablet, TAKE 1 TABLET BY MOUTH TWO  TIMES DAILY WITH A MEAL, Disp: 180 tablet, Rfl: 3 .  clopidogrel (PLAVIX) 75 MG tablet, TAKE 1 TABLET BY MOUTH  DAILY, Disp: 90 tablet, Rfl: 2 .  furosemide (LASIX) 40 MG tablet, TAKE 1 TABLET BY MOUTH EVERY MORNING TAKE AS NEEDED FOR LEG SWELLING, Disp: , Rfl: 6 .  magnesium oxide (MAG-OX) 400 (241.3 Mg) MG tablet, TAKE 1 TABLET BY MOUTH TWO TIMES DAILY., Disp: 180 tablet, Rfl: 3 .  Multiple Vitamin (MULTIVITAMIN) tablet, Take 1 tablet by mouth every morning. , Disp: , Rfl:  .  nitroGLYCERIN (NITROSTAT) 0.4 MG SL tablet, Place 0.4 mg under the tongue every 5 (five) minutes as needed for chest pain (Up to 3 times)., Disp: , Rfl:  .  pantoprazole (PROTONIX) 40 MG tablet, Take 1 tablet (40 mg total) by mouth daily., Disp: 30 tablet, Rfl: 12 .  ranitidine (ZANTAC) 150 MG tablet, Take 1 tablet by mouth at bedtime., Disp: , Rfl:  .  sodium bicarbonate 650 MG tablet, Take 650 mg by mouth 2 (two) times daily., Disp: , Rfl:  .  enalapril (VASOTEC) 2.5 MG tablet, Take 2.5 mg by mouth daily., Disp: , Rfl:   Current Facility-Administered Medications:  .  0.9 %  sodium chloride infusion, 500 mL, Intravenous, Continuous, Armbruster, Carlota Raspberry, MD  Review of Systems  Constitutional: Negative for appetite change, chills and fever.  HENT: Negative.   Eyes: Negative.   Respiratory: Positive for shortness of breath. Negative for chest tightness and wheezing.   Cardiovascular: Positive for leg swelling. Negative for chest pain and palpitations.  Gastrointestinal:  Positive for abdominal pain. Negative for nausea and vomiting.  Endocrine: Negative.   Genitourinary: Negative.   Musculoskeletal: Positive for arthralgias and back pain.  Allergic/Immunologic: Negative.   Neurological: Negative.   Hematological: Negative.   Psychiatric/Behavioral: Negative.     Social History   Tobacco Use  . Smoking status: Former Smoker    Packs/day: 3.00    Years: 20.00    Pack years: 60.00    Types: Cigarettes    Last attempt to quit: 07/25/1986    Years since quitting: 30.8  . Smokeless tobacco: Never Used  Substance Use Topics  . Alcohol use: No    Alcohol/week: 0.0 oz   Objective:   BP (!) 150/74 (BP Location: Left Arm, Cuff Size: Large)   Pulse 76   Temp 97.6 F (36.4 C) (Oral)   Resp 16   Wt 241 lb (109.3 kg)   SpO2 97% Comment: room air  BMI 35.59  kg/m  Vitals:   05/26/17 1640 05/26/17 1642  BP: (!) 158/78 (!) 150/74  Pulse: 76   Resp: 16   Temp: 97.6 F (36.4 C)   TempSrc: Oral   SpO2: 97%   Weight: 241 lb (109.3 kg)      Physical Exam  Constitutional: He is oriented to person, place, and time. He appears well-developed and well-nourished.  Trunkal obesity.  HENT:  Head: Normocephalic and atraumatic.  Eyes: Conjunctivae are normal.  Neck: No thyromegaly present.  Cardiovascular: Normal rate and regular rhythm.  Pulmonary/Chest: Effort normal and breath sounds normal.  Abdominal: Soft.  Neurological: He is alert and oriented to person, place, and time.  Skin: Skin is warm and dry.  Psychiatric: He has a normal mood and affect. His behavior is normal. Judgment and thought content normal.        Assessment & Plan:     1. Diabetes mellitus with nephropathy (HCC)  - POCT HgB A1C--6.3 today.  2. Tinea corporis/Monilia Nystatin cream. 3.CAD/ASCVD 4.Obesity 5.GERD 6.AR Try Fluticasone and robitussin for symptom relief. 7.HTN 8.CKD  I have done the exam and reviewed the chart and it is accurate to the best of my  knowledge. Development worker, community has been used and  any errors in dictation or transcription are unintentional. Miguel Aschoff M.D. [facility]   - nystatin cream (MYCOSTATIN); Apply 1 application topically 2 (two) times daily as needed for dry skin.  Dispense: 30 g; Refill: 0       Ronald Cranford Mon, MD  [facility]

## 2017-06-03 DIAGNOSIS — N183 Chronic kidney disease, stage 3 (moderate): Secondary | ICD-10-CM | POA: Diagnosis not present

## 2017-06-03 DIAGNOSIS — I10 Essential (primary) hypertension: Secondary | ICD-10-CM | POA: Diagnosis not present

## 2017-06-03 DIAGNOSIS — I701 Atherosclerosis of renal artery: Secondary | ICD-10-CM | POA: Diagnosis not present

## 2017-06-03 DIAGNOSIS — R600 Localized edema: Secondary | ICD-10-CM | POA: Diagnosis not present

## 2017-06-09 ENCOUNTER — Ambulatory Visit: Payer: Medicare Other | Admitting: Cardiovascular Disease

## 2017-06-09 ENCOUNTER — Encounter: Payer: Self-pay | Admitting: Cardiovascular Disease

## 2017-06-09 VITALS — BP 138/60 | HR 70 | Ht 69.0 in | Wt 239.8 lb

## 2017-06-09 DIAGNOSIS — I255 Ischemic cardiomyopathy: Secondary | ICD-10-CM

## 2017-06-09 DIAGNOSIS — I493 Ventricular premature depolarization: Secondary | ICD-10-CM | POA: Diagnosis not present

## 2017-06-09 DIAGNOSIS — I25118 Atherosclerotic heart disease of native coronary artery with other forms of angina pectoris: Secondary | ICD-10-CM

## 2017-06-09 DIAGNOSIS — I5023 Acute on chronic systolic (congestive) heart failure: Secondary | ICD-10-CM

## 2017-06-09 DIAGNOSIS — E7800 Pure hypercholesterolemia, unspecified: Secondary | ICD-10-CM | POA: Diagnosis not present

## 2017-06-09 DIAGNOSIS — I10 Essential (primary) hypertension: Secondary | ICD-10-CM

## 2017-06-09 MED ORDER — CARVEDILOL 25 MG PO TABS: 25.0000 mg | ORAL_TABLET | Freq: Two times a day (BID) | ORAL | 3 refills | Status: DC

## 2017-06-09 NOTE — Patient Instructions (Signed)
Medication Instructions:  Your physician has recommended you make the following change in your medication:  Take furosemide 40 mg by mouth daily for one week. Increase Coreg to 25 mg by mouth twice daily.    Labwork: Have lab work done in one week in [place] ([facility])  Testing/Procedures: none  Follow-Up: Your physician recommends that you schedule a follow-up appointment in: 2-3 weeks with PA.     Any Other Special Instructions Will Be Listed Below (If Applicable).     If you need a refill on your cardiac medications before your next appointment, please call your pharmacy.

## 2017-06-09 NOTE — Progress Notes (Signed)
Chief Complaint  Patient presents with  . Coronary Artery Disease    History of Present Illness: 72 y.o. male with history of CAD s/p CABG in 1996 (LIMA-LAD, SVG-circumflex, SVG-RCA, SVG-diagonal) with redo bypass in 2007 (SVG-OM, SVG-RCA), ischemic cardiomyopathy, CKD, DM 2, HTN, HLD, renal artery stenosis, COPD, GERD and PVCs here today for cardiac follow up. Echo in 2014 with normal LV systolic function. He was hospitalized at [facility] July 2016 with a NSTEMI. Cardiac cath with severe disease in the SVG to OM treated with a drug eluting stent. He was treated with Brilinta post PCI but developed dyspnea and was changed to Plavix. He was admitted to [facility] May 2017 with unstable angina and cardiac cath showed severe disease in the distal body/anastomosis of the SVG to Diagonal.  A drug eluting stent was placed in this vein graft. The LIMA to LAD was patent, SVG to OM was patent, SVG to PDA/PLA was patent. He was admitted to [facility] February 2018 with chest pain. Troponin was negative x 3. He did not have an ischemic evaluation. Echo February 2018 with  mild LV systolic dysfunction. LVEF=45-50%.   He is here today for follow up. The patient denies any chest pain but he does endorse one month of worsened dyspnea. He has no awareness of irregularity of his heart rhythm. He has no dizziness. He noticed this after taking steroids last month. He has had a 5 lb weight gain over the past few months with some ankle edema. He has chronic sinus drainage and feels mucus in his throat making breathing seem difficult. He also notes orthopnea for the last month. No near syncope or syncope.   Primary Care Physician: [name]., MD   Past Medical History:  Diagnosis Date  . Acute renal failure superimposed on stage 3 chronic kidney disease ([place]) 10/07/2015  . Arthritis    "hips; back" (12/13/2014)  . CAD (coronary artery disease) 2007   a. s/p CABG- IMA-LAD, VG-Cx, VG-RCA, VG-diag in 1999. B. sp redo CABG-  VG-OM, VG-RCA in 2007 due to VG disease. c. NSTEMI 11/2014 s/p DES to SVG-OM from the Y graft.d. PTCA/DES x 1 distal body of SVG to Diagonal.09/2015  . CHF (congestive heart failure) ([place])   . Chronic lower back pain   . CKD (chronic kidney disease), stage IV (HCC)    Creatinine 1.9 to 2.2  . COPD (chronic obstructive pulmonary disease) ([place])   . Deafness in left ear   . Degenerative disc disease, lumbar   . Dilated cardiomyopathy ([place]) 10/07/2015  . Dysrhythmia    bigeminy pvcs  . Emphysema   . Esophageal stricture 07/02/1998   EGD  . GERD (gastroesophageal reflux disease)   . History of gout    "last flareup was in 2007" (12/13/2014)  . History of hiatal hernia   . Hyperlipidemia   . Hypertension   . Ischemic cardiomyopathy 2006   EF 40% to 50% by 2D echo in 2006;  Echo 12/31/12: Mild LVH, EF 50-55%, normal wall motion.   . Renal artery stenosis ([place])   . Type II diabetes mellitus (HCC)    Diet control   . Walking pneumonia 1990's    Past Surgical History:  Procedure Laterality Date  . CARDIAC CATHETERIZATION  "several"  . CARDIAC CATHETERIZATION N/A 12/13/2014   Procedure: Left Heart Cath and Coronary Angiography;  Surgeon: Jettie Booze, MD;  Location: [place] [facility];  Service: Cardiovascular;  Laterality: N/A;  . CARDIAC CATHETERIZATION  12/13/2014  Procedure: Coronary Stent Intervention;  Surgeon: Jettie Booze, MD;  Location: [place] [facility];  Service: Cardiovascular;;  . CARDIAC CATHETERIZATION N/A 10/07/2015   Procedure: Left Heart Cath and Cors/Grafts Angiography;  Surgeon: Burnell Blanks, MD;  Location: [place] [facility];  Service: Cardiovascular;  Laterality: N/A;  . CARDIAC CATHETERIZATION N/A 10/07/2015   Procedure: Coronary Stent Intervention;  Surgeon: Burnell Blanks, MD;  Location: [place] [facility];  Service: Cardiovascular;  Laterality: N/A;  . CORONARY ANGIOPLASTY  "several"  . CORONARY ANGIOPLASTY WITH STENT PLACEMENT  2005;  12/13/2014   "2; 1"  . CORONARY ARTERY BYPASS GRAFT  1996   CABG X5  . CORONARY ARTERY BYPASS GRAFT  March 2007   CABG X3  . ESOPHAGOGASTRODUODENOSCOPY (EGD) WITH ESOPHAGEAL DILATION  2000  . GREEN LIGHT LASER TURP (TRANSURETHRAL RESECTION OF PROSTATE  2000's   "not cancerous"  . HERNIA REPAIR    . LAPAROSCOPIC CHOLECYSTECTOMY    . LUNG SURGERY  1996   "S/P CABG, had to put staple in lung after it had collapsed"  . UMBILICAL HERNIA REPAIR     w/chole    Current Outpatient Medications  Medication Sig Dispense Refill  . acetaminophen (TYLENOL) 650 MG CR tablet Take 1,300 mg by mouth daily as needed for pain.    [name] allopurinol (ZYLOPRIM) 100 MG tablet TAKE 1 TABLET BY MOUTH AT  BEDTIME 90 tablet 3  . amLODipine (NORVASC) 5 MG tablet TAKE 1 TABLET BY MOUTH  DAILY (Patient taking differently: No sig reported) 90 tablet 3  . aspirin 81 MG tablet Take 81 mg by mouth every morning.     [name] atorvastatin (LIPITOR) 40 MG tablet Take 1 tablet (40 mg total) by mouth daily. 90 tablet 1  . clopidogrel (PLAVIX) 75 MG tablet TAKE 1 TABLET BY MOUTH  DAILY 90 tablet 2  . furosemide (LASIX) 40 MG tablet TAKE 1 TABLET BY MOUTH EVERY MORNING TAKE AS NEEDED FOR LEG SWELLING  6  . magnesium oxide (MAG-OX) 400 (241.3 Mg) MG tablet TAKE 1 TABLET BY MOUTH TWO TIMES DAILY. 180 tablet 3  . Multiple Vitamin (MULTIVITAMIN) tablet Take 1 tablet by mouth every morning.     . nitroGLYCERIN (NITROSTAT) 0.4 MG SL tablet Place 0.4 mg under the tongue every 5 (five) minutes as needed for chest pain (Up to 3 times).    . nystatin cream (MYCOSTATIN) Apply 1 application topically 2 (two) times daily as needed for dry skin. 30 g 0  . pantoprazole (PROTONIX) 40 MG tablet Take 1 tablet (40 mg total) by mouth daily. 30 tablet 12  . ranitidine (ZANTAC) 150 MG tablet Take 1 tablet by mouth at bedtime.    . sodium bicarbonate 650 MG tablet Take 650 mg by mouth 2 (two) times daily.    . carvedilol (COREG) 25 MG tablet Take 1 tablet (25  mg total) by mouth 2 (two) times daily. 180 tablet 3   Current Facility-Administered Medications  Medication Dose Route Frequency Provider Last Rate Last Dose  . 0.9 %  sodium chloride infusion  500 mL Intravenous Continuous Armbruster, Carlota Raspberry, MD        Allergies  Allergen Reactions  . Ciprofloxacin     GI upset  . Hydrochlorothiazide Other (See Comments)    Dehydration  . Hydrocodone     Stomach upset  . Hydrocodone-Acetaminophen     Stomach upset  . Hydrocodone-Acetaminophen Nausea Only  . Loratadine     Other reaction(s): Unknown  .  Sulfa Antibiotics Other (See Comments)    Cannot recall  . Sulfacetamide Sodium Other (See Comments)    Cannot recall  . Sulfasalazine     Other reaction(s): Other (See Comments) Cannot recall  . Penicillins Hives and Rash    Has patient had a PCN reaction causing immediate rash, facial/tongue/throat swelling, SOB or lightheadedness with hypotension: YES Has patient had a PCN reaction causing severe rash involving mucus membranes or skin necrosis: NO Has patient had a PCN reaction that required hospitalization NO Has patient had a PCN reaction occurring within the last 10 years:NO If all of the above answers are "NO", then may proceed with Cephalosporin use.    Social History   Socioeconomic History  . Marital status: Married    Spouse name: Not on file  . Number of children: 2  . Years of education: Not on file  . Highest education level: Not on file  Social Needs  . Financial resource strain: Not on file  . Food insecurity - worry: Not on file  . Food insecurity - inability: Not on file  . Transportation needs - medical: Not on file  . Transportation needs - non-medical: Not on file  Occupational History  . Occupation: Retired  Tobacco Use  . Smoking status: Former Smoker    Packs/day: 3.00    Years: 20.00    Pack years: 60.00    Types: Cigarettes    Last attempt to quit: 07/25/1986    Years since quitting: 30.8  .  Smokeless tobacco: Never Used  Substance and Sexual Activity  . Alcohol use: No    Alcohol/week: 0.0 oz  . Drug use: No  . Sexual activity: Not Currently  Other Topics Concern  . Not on file  Social History Narrative   Did auto salvage work.    Family History  Problem Relation Age of Onset  . Heart attack Mother        MI  . Stroke Mother   . Heart disease Mother   . Hypertension Mother   . Hyperlipidemia Mother   . Heart disease Father   . Rheumatic fever Father   . Colon cancer Neg Hx     Review of Systems:  As stated in the HPI and otherwise negative.   BP 138/60   Pulse 70   Ht 5\' 9"  (1.753 m)   Wt 239 lb 12.8 oz (108.8 kg)   SpO2 96%   BMI 35.41 kg/m   Physical Examination:  General: Well developed, well nourished, NAD  HEENT: OP clear, mucus membranes moist  SKIN: warm, dry. No rashes. Neuro: No focal deficits  Musculoskeletal: Muscle strength 5/5 all ext  Psychiatric: Mood and affect normal  Neck: No JVD, no carotid bruits, no thyromegaly, no lymphadenopathy.  Lungs:Clear bilaterally, no wheezes, rhonci, crackles Cardiovascular: Regular rate and rhythm. No murmurs, gallops or rubs. Abdomen:Soft. Bowel sounds present. Non-tender.  Extremities: Trace bilateral lower extremity edema.   Echo 07/04/16: Left ventricle: The cavity size was normal. There was mild   concentric hypertrophy. Systolic function was mildly reduced. The   estimated ejection fraction was in the range of 45% to 50%. Mild   diffuse hypokinesis. Doppler parameters are consistent with   abnormal left ventricular relaxation (grade 1 diastolic   dysfunction). Doppler parameters are consistent with   indeterminate ventricular filling pressure. - Aortic valve: Transvalvular velocity was within the normal range.   There was no stenosis. There was no regurgitation. - Mitral valve: Transvalvular velocity was within  the normal range.   There was no evidence for stenosis. There was mild  regurgitation. - Left atrium: The atrium was mildly dilated. - Right ventricle: The cavity size was normal. Wall thickness was   normal. Systolic function was normal. - Atrial septum: No defect or patent foramen ovale was identified   by color flow Doppler. - Tricuspid valve: There was mild regurgitation. - Pulmonary arteries: Systolic pressure was within the normal   range. PA peak pressure: 27 mm Hg (S).  Cardiac cath May 2017: 1. 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  2. LAD occluded proximally. The mid and distal vessel fills from the patent IMA graft. The Diagonal fills from the patent SVG (there is a severe stenosis at this anastomosis).  3. Circumflex is occluded proximally. The vein graft to the OM is patent.  4. RCA is occluded proximally. The vein grafts to the distal RCA branches (PDA and PLA) are patent with diffuse moderate disease in the bodies of the vein grafts.  5. Successful PTCA/DES x 1 distal body of SVG to Diagonal.   EKG:  EKG is ordered today. The ekg ordered today demonstrates Sinus with PVCs in pattern of bigeminy.   Recent Labs: 01/13/2017: ALT 15; BUN 35; Creatinine, Ser 2.08; Hemoglobin 13.0; Platelets 162; Potassium 5.3; Sodium 143; TSH 4.150   Lipid Panel    Component Value Date/Time   CHOL 138 01/13/2017 0914   TRIG 118 01/13/2017 0914   HDL 40 01/13/2017 0914   CHOLHDL 4.5 12/13/2014 0423   VLDL 25 12/13/2014 0423   LDLCALC 74 01/13/2017 0914     Wt Readings from Last 3 Encounters:  06/09/17 239 lb 12.8 oz (108.8 kg)  05/26/17 241 lb (109.3 kg)  01/21/17 234 lb 12.8 oz (106.5 kg)     Other studies Reviewed: Additional studies/ records that were reviewed today include: . Review of the above records demonstrates:    Assessment and Plan:   1. CAD with stable angina: No chest pain suggestive of unstable angina. He is having dyspnea but this is not typically his anginal equivalent. Most recent cath May 2017 with placement of  DES in the body of the SVG to Diagonal branch. I will continue ASA, Plavix, statin and beta blocker.    2. Ischemic Cardiomyopathy: Last LVEF=45-50% by echo 2018. I will continue his beta blocker and Ace-inh.    3. Hypertension: BP is controlled. No changes today.   4. Hyperlipidemia: Lipids followed in primary care. Continue statin.   5. CKD: Followed by Nephrology. Baseline creatinine 2.0-2.5.   6. Acute on chronic systolic CHF: He has mild volume overload on exam. I think he would benefit from diuresis. I will start Lasix 40 mg daily. BMET one week. Will have to be careful with diuresis given his renal insufficiency.   7. PVCs/Bigeminy: This could be contributing to his dyspnea. I will increase his Coreg to 25 mg po BID. He will follow BP closely at home with this change.   Current medicines are reviewed at length with the patient today.  The patient does not have concerns regarding medicines.  The following changes have been made:  no change  Labs/ tests ordered today include:   Orders Placed This Encounter  Procedures  . Basic Metabolic Panel (BMET)  . EKG 12-Lead    Disposition:   FU with an office APP in 2-3 weeks. (he has seen Brittainy, Event organiser and [place] in the past).   Signed, Lauree Chandler, MD  06/09/2017 [facility] [place], [place], [place]  [address] Phone: [phone]; Fax: [phone]

## 2017-06-16 ENCOUNTER — Other Ambulatory Visit: Payer: Self-pay | Admitting: Cardiovascular Disease

## 2017-06-16 DIAGNOSIS — I493 Ventricular premature depolarization: Secondary | ICD-10-CM | POA: Diagnosis not present

## 2017-06-16 DIAGNOSIS — I5023 Acute on chronic systolic (congestive) heart failure: Secondary | ICD-10-CM | POA: Diagnosis not present

## 2017-06-20 LAB — BASIC METABOLIC PANEL
BUN / CREAT RATIO: 19 (ref 10–24)
BUN: 45 mg/dL — ABNORMAL HIGH (ref 8–27)
CHLORIDE: 105 mmol/L (ref 96–106)
CO2: 20 mmol/L (ref 20–29)
Calcium: 9.5 mg/dL (ref 8.6–10.2)
Creatinine, Ser: 2.33 mg/dL — ABNORMAL HIGH (ref 0.76–1.27)
GFR, EST AFRICAN AMERICAN: 31 mL/min/{1.73_m2} — AB (ref >59)
GFR, EST NON AFRICAN AMERICAN: 27 mL/min/{1.73_m2} — AB (ref >59)
Glucose: 131 mg/dL — ABNORMAL HIGH (ref 65–99)
POTASSIUM: 4.2 mmol/L (ref 3.5–5.2)
Sodium: 145 mmol/L — ABNORMAL HIGH (ref 134–144)

## 2017-07-07 ENCOUNTER — Telehealth: Payer: Self-pay | Admitting: Physician Assistant

## 2017-07-07 ENCOUNTER — Encounter: Payer: Self-pay | Admitting: Physician Assistant

## 2017-07-07 ENCOUNTER — Ambulatory Visit (INDEPENDENT_AMBULATORY_CARE_PROVIDER_SITE_OTHER): Payer: Medicare Other | Admitting: Physician Assistant

## 2017-07-07 VITALS — BP 134/54 | HR 68 | Ht 69.0 in | Wt 236.4 lb

## 2017-07-07 DIAGNOSIS — I714 Abdominal aortic aneurysm, without rupture, unspecified: Secondary | ICD-10-CM

## 2017-07-07 DIAGNOSIS — N184 Chronic kidney disease, stage 4 (severe): Secondary | ICD-10-CM

## 2017-07-07 DIAGNOSIS — I2510 Atherosclerotic heart disease of native coronary artery without angina pectoris: Secondary | ICD-10-CM | POA: Diagnosis not present

## 2017-07-07 DIAGNOSIS — I701 Atherosclerosis of renal artery: Secondary | ICD-10-CM

## 2017-07-07 DIAGNOSIS — I5042 Chronic combined systolic (congestive) and diastolic (congestive) heart failure: Secondary | ICD-10-CM

## 2017-07-07 DIAGNOSIS — R0602 Shortness of breath: Secondary | ICD-10-CM

## 2017-07-07 DIAGNOSIS — I10 Essential (primary) hypertension: Secondary | ICD-10-CM | POA: Diagnosis not present

## 2017-07-07 DIAGNOSIS — I493 Ventricular premature depolarization: Secondary | ICD-10-CM

## 2017-07-07 NOTE — Telephone Encounter (Signed)
New message  Pt wife calling saying they can not find where they were suppose to go to get the Chest xray

## 2017-07-07 NOTE — Patient Instructions (Addendum)
Medication Instructions:  Your physician recommends that you continue on your current medications as directed. Please refer to the Current Medication list given to you today.   Labwork: TODAY:  CMET, MAGNESIUM, CBC, TSH, & PRO BNP  Testing/Procedures: Your physician has requested that you have an echocardiogram. Echocardiography is a painless test that uses sound waves to create images of your heart. It provides your doctor with information about the size and shape of your heart and how well your heart's chambers and valves are working. This procedure takes approximately one hour. There are no restrictions for this procedure.   Your physician has requested that you have an abdominal aorta duplex. During this test, an ultrasound is used to evaluate the aorta. Allow 30 minutes for this exam. Do not eat after midnight the day before and avoid carbonated beverages  A chest x-ray takes a picture of the organs and structures inside the chest, including the heart, lungs, and blood vessels. This test can show several things, including, whether the heart is enlarges; whether fluid is building up in the lungs; and whether pacemaker / defibrillator leads are still in place.  Follow-Up: Your physician recommends that you schedule a follow-up appointment in: [place], PA-C OR ANY APP ON A DAY DR. Angelena Form IS IN THE OFFICE   Any Other Special Instructions Will Be Listed Below (If Applicable).  Echocardiogram An echocardiogram, or echocardiography, uses sound waves (ultrasound) to produce an image of your heart. The echocardiogram is simple, painless, obtained within a short period of time, and offers valuable information to your health care provider. The images from an echocardiogram can provide information such as:  Evidence of coronary artery disease (CAD).  Heart size.  Heart muscle function.  Heart valve function.  Aneurysm detection.  Evidence of a past heart attack.  Fluid  buildup around the heart.  Heart muscle thickening.  Assess heart valve function.  Tell a health care provider about:  Any allergies you have.  All medicines you are taking, including vitamins, herbs, eye drops, creams, and over-the-counter medicines.  Any problems you or family members have had with anesthetic medicines.  Any blood disorders you have.  Any surgeries you have had.  Any medical conditions you have.  Whether you are pregnant or may be pregnant. What happens before the procedure? No special preparation is needed. Eat and drink normally. What happens during the procedure?  In order to produce an image of your heart, gel will be applied to your chest and a wand-like tool (transducer) will be moved over your chest. The gel will help transmit the sound waves from the transducer. The sound waves will harmlessly bounce off your heart to allow the heart images to be captured in real-time motion. These images will then be recorded.  You may need an IV to receive a medicine that improves the quality of the pictures. What happens after the procedure? You may return to your normal schedule including diet, activities, and medicines, unless your health care provider tells you otherwise. This information is not intended to replace advice given to you by your health care provider. Make sure you discuss any questions you have with your health care provider. Document Released: 05/14/2000 Document Revised: 01/03/2016 Document Reviewed: 01/22/2013 Elsevier Interactive Patient Education  2017 [place] are tests that create pictures of the inside of your body using radiation. Different body parts absorb different amounts of radiation, which show up on the X-ray pictures in  shades of black, gray, and white. X-rays are used to look for many health conditions, including broken bones, lung problems, and some types of cancer. Tell a health care provider about:  Any  allergies you have.  All medicines you are taking, including vitamins, herbs, eye drops, creams, and over-the-counter medicines.  Previous surgeries you have had.  Medical conditions you have. What are the risks? Getting an X-ray is a safe procedure. What happens before the procedure?  Tell the X-ray technician if you are pregnant or might be pregnant.  You may be asked to wear a protective lead apron to hide parts of your body from the X-ray.  You usually will need to undress whatever part of your body needs the X-ray. You will be given a hospital gown to wear, if needed.  You may need to remove your glasses, jewelry, and other metal objects. What happens during the procedure?  The X-ray machine creates a picture by using a tiny burst of radiation. It is painless.  You may need to have several pictures taken at different angles.  You will need to try to be as still as you can during the examination to get the best possible images. What happens after the procedure?  You will be able to resume your normal activities.  The X-ray will be examined by your health care provider or a radiology specialist.  It is your responsibility to get your test results. Ask your health care provider when to expect your results and how to get them. This information is not intended to replace advice given to you by your health care provider. Make sure you discuss any questions you have with your health care provider. Document Released: 05/17/2005 Document Revised: 01/15/2016 Document Reviewed: 07/11/2013 Elsevier Interactive Patient Education  [employer].   If you need a refill on your cardiac medications before your next appointment, please call your pharmacy.

## 2017-07-07 NOTE — Progress Notes (Signed)
Cardiology Office Note    Date:  07/07/2017  ID:  Ronald [name], DOB 05/14/1946, MRN [id number] PCP:  [name]., MD  Cardiologist:  Angelena Form   Chief Complaint: f/u shortness of breath  History of Present Illness:  Ronald [name] is a 72 y.o. male with complicated history of CAD s/p CABG in 1996 (LIMA-LAD, SVG-circumflex, [place], SVG-diagonal) with redo bypass in 2007 (SVG-OM, SVG-RCA), subsequent PCIs (DES to SVG-OM 2016, DES to SVG-diagonal in 2017), ischemic cardiomyopathy/chronic combined CHF, CKD III-IV, DM 2, HTN, HLD, renal artery stenosis by CT 2008, 3cm AAA 2015, COPD, GERD and PVCs here today for f/u of CHF.  To recap, he does have history of flucutating LV dysfunction dating back to echo in 2006 when EF was 40-45%. 2D echo in 2014 showed normal LV systolic function. He was hospitalized at [facility] July 2016 with a NSTEMI at which time stenosis in the SVG to OM as treated with a drug eluting stent. He was treated with Brilinta post PCI but developed dyspnea and was changed to Plavix. He was admitted May 2017 with unstable angina and cardiac cath showed severe disease in the distal body/anastomosis of the SVG to Diagonal. A drug eluting stent was placed in this vein graft. The LIMA to LAD was patent, SVG to OM was patent, SVG to PDA/PLA was patent. He was admitted to [facility] February 2018 with chest pain. Troponin was negative x 3. He did not have an ischemic evaluation. 2D echo February 2018 showed mild LV dysfunction with EF 45-50%, grade 1 DD, mild LAE, mild TR. For clarification on history of renal artery stenosis it appears this was noted on 2008 CT angio in the right (of possible hemodynamic significance) and mild stenosis of the left renal artery. He also has an AAA measuring 3cm in 2015 with repeat due 2018.  When seen by Dr. Angelena Form in 05/2017 he reported recently worsened dyspnea, orthopnea, ankle edema and 5lb weight gain over several months' time. EKG showed ventricular  bigeminy which he was generally unaware of. Lasix 40mg  daily was added and carvedilol was titrated. Most recent labs 06/16/17 showed K 4.2, Cr 2.33; otherwise recent labs showed normal TSH, LDL 74, A1C 5.9, Hgb 13.0 (12/2016).  He presents back for follow-up. In general he does not feel much different. He has mild lower extremity edema which he feels has improved on Lasix, but continues to have orthopnea and exertional dyspnea. This latter symptoms have not worsened recently, only remained stable. He has been able to work in the yard without recurrent "boom boom boom" chest pain which was his anginal equivalent in the past. He eats whatever he wants including eating out frequently. Enalapril was stopped by Dr. Candiss Norse [facility] in [place]) several months ago. His major complaint is intermittent random abdominal pains that have been present for 20 years; he reports extensive w/u without obvious cause.   Past Medical History:  Diagnosis Date  . AAA (abdominal aortic aneurysm) ([place])    a. 3cm by [place] 2015.  [name] Arthritis    "hips; back" (12/13/2014)  . CAD (coronary artery disease) 2007   a. s/p CABG- IMA-LAD, VG-Cx, VG-RCA, VG-diag in 1999. B. sp redo CABG- VG-OM, VG-RCA in 2007 due to VG disease. c. NSTEMI 11/2014 s/p DES to SVG-OM from the Y graft.d. PTCA/DES x 1 distal body of SVG to Diagonal.09/2015  . Chronic combined systolic and diastolic CHF (congestive heart failure) ([place])    a. remote EF 40-45% in 2006.  b. Normal EF 2014. b. Echo 07/2016 EF 45-50%, grade 1 DD.  [name] Chronic lower back pain   . CKD (chronic kidney disease), stage IV ([place])   . COPD (chronic obstructive pulmonary disease) ([place])   . Deafness in left ear   . Degenerative disc disease, lumbar   . 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 Emphysema   . Esophageal stricture 07/02/1998   EGD  . GERD (gastroesophageal reflux disease)   . History of gout    "last flareup was in 2007" (12/13/2014)  . History of hiatal hernia   .  Hyperlipidemia   . Hypertension   . Ischemic cardiomyopathy 2006   EF 40% to 50% by 2D echo in 2006;  Echo 12/31/12: Mild LVH, EF 50-55%, normal wall motion.   [name] PVC's (premature ventricular contractions)   . Renal artery stenosis ([place])    a. noted on CT 2008.  . Type II diabetes mellitus ([place])    Diet control   . Walking pneumonia 1990's    Past Surgical History:  Procedure Laterality Date  . CARDIAC CATHETERIZATION  "several"  . CARDIAC CATHETERIZATION N/A 12/13/2014   Procedure: Left Heart Cath and Coronary Angiography;  Surgeon: Jettie Booze, MD;  Location: [place] [facility];  Service: Cardiovascular;  Laterality: N/A;  . CARDIAC CATHETERIZATION  12/13/2014   Procedure: Coronary Stent Intervention;  Surgeon: Jettie Booze, MD;  Location: [place] [facility];  Service: Cardiovascular;;  . CARDIAC CATHETERIZATION N/A 10/07/2015   Procedure: Left Heart Cath and Cors/Grafts Angiography;  Surgeon: Burnell Blanks, MD;  Location: [place] [facility];  Service: Cardiovascular;  Laterality: N/A;  . CARDIAC CATHETERIZATION N/A 10/07/2015   Procedure: Coronary Stent Intervention;  Surgeon: Burnell Blanks, MD;  Location: [place] [facility];  Service: Cardiovascular;  Laterality: N/A;  . CORONARY ANGIOPLASTY  "several"  . CORONARY ANGIOPLASTY WITH STENT PLACEMENT  2005; 12/13/2014   "2; 1"  . CORONARY ARTERY BYPASS GRAFT  1996   CABG X5  . CORONARY ARTERY BYPASS GRAFT  March 2007   CABG X3  . ESOPHAGOGASTRODUODENOSCOPY (EGD) WITH ESOPHAGEAL DILATION  2000  . GREEN LIGHT LASER TURP (TRANSURETHRAL RESECTION OF PROSTATE  2000's   "not cancerous"  . HERNIA REPAIR    . LAPAROSCOPIC CHOLECYSTECTOMY    . LUNG SURGERY  1996   "S/P CABG, had to put staple in lung after it had collapsed"  . UMBILICAL HERNIA REPAIR     w/chole    Current Medications: Current Meds  Medication Sig  . acetaminophen (TYLENOL) 650 MG CR tablet Take 1,300 mg by mouth daily as needed for pain.  [name]  allopurinol (ZYLOPRIM) 100 MG tablet TAKE 1 TABLET BY MOUTH AT  BEDTIME  . amLODipine (NORVASC) 5 MG tablet TAKE 1 TABLET BY MOUTH  DAILY (Patient taking differently: No sig reported)  . aspirin 81 MG tablet Take 81 mg by mouth every morning.   [name] atorvastatin (LIPITOR) 40 MG tablet Take 1 tablet (40 mg total) by mouth daily.  . carvedilol (COREG) 25 MG tablet Take 1 tablet (25 mg total) by mouth 2 (two) times daily.  . clopidogrel (PLAVIX) 75 MG tablet TAKE 1 TABLET BY MOUTH  DAILY  . furosemide (LASIX) 40 MG tablet TAKE 1 TABLET BY MOUTH EVERY MORNING TAKE AS NEEDED FOR LEG SWELLING  . magnesium oxide (MAG-OX) 400 (241.3 Mg) MG tablet TAKE 1 TABLET BY MOUTH TWO TIMES DAILY.  . Multiple Vitamin (MULTIVITAMIN) tablet Take 1 tablet by mouth  every morning.   . nitroGLYCERIN (NITROSTAT) 0.4 MG SL tablet Place 0.4 mg under the tongue every 5 (five) minutes as needed for chest pain (Up to 3 times).  . nystatin cream (MYCOSTATIN) Apply 1 application topically 2 (two) times daily as needed for dry skin.  . pantoprazole (PROTONIX) 40 MG tablet Take 1 tablet (40 mg total) by mouth daily.  . ranitidine (ZANTAC) 150 MG tablet Take 1 tablet by mouth at bedtime.  . sodium bicarbonate 650 MG tablet Take 650 mg by mouth 2 (two) times daily.   Current Facility-Administered Medications for the 07/07/17 encounter (Office Visit) with Charlie Pitter, PA-C  Medication  . 0.9 %  sodium chloride infusion     Allergies:   Predicort [prednisolone]; Ciprofloxacin; Hydrochlorothiazide; Hydrocodone; Hydrocodone-acetaminophen; Hydrocodone-acetaminophen; Loratadine; Sulfa antibiotics; Sulfacetamide sodium; Sulfasalazine; Hydrocodone-acetaminophen; and Penicillins   Social History   Socioeconomic History  . Marital status: Married    Spouse name: None  . Number of children: 2  . Years of education: None  . Highest education level: None  Social Needs  . Financial resource strain: None  . Food insecurity - worry: None    . Food insecurity - inability: None  . Transportation needs - medical: None  . Transportation needs - non-medical: None  Occupational History  . Occupation: Retired  Tobacco Use  . Smoking status: Former Smoker    Packs/day: 3.00    Years: 20.00    Pack years: 60.00    Types: Cigarettes    Last attempt to quit: 07/25/1986    Years since quitting: 30.9  . Smokeless tobacco: Never Used  Substance and Sexual Activity  . Alcohol use: No    Alcohol/week: 0.0 oz  . Drug use: No  . Sexual activity: Not Currently  Other Topics Concern  . None  Social History Narrative   Did auto salvage work.     Family History:  Family History  Problem Relation Age of Onset  . Heart attack Mother        MI  . Stroke Mother   . Heart disease Mother   . Hypertension Mother   . Hyperlipidemia Mother   . Heart disease Father   . Rheumatic fever Father   . Colon cancer Neg Hx     ROS:   Please see the history of present illness.  All other systems are reviewed and otherwise negative.    PHYSICAL EXAM:   VS:  BP (!) 134/54   Pulse 68   Ht 5\' 9"  (1.753 m)   Wt 238 lb 6.4 oz (108.1 kg)   SpO2 95%   BMI 35.21 kg/m   BMI: Body mass index is 35.21 kg/m. GEN: Well nourished, well developed obese WM, in no acute distress  HEENT: normocephalic, atraumatic Neck: no JVD, carotid bruits, or masses Cardiac: RRR; no murmurs, rubs, or gallops, trace-1+ edema RLE, trace LLE edema  Respiratory:  clear to auscultation bilaterally, normal work of breathing GI:  Rounded but soft, nontender, nondistended, + BS, no bruits MS: no deformity or atrophy  Skin: warm and dry, no rash Neuro:  Alert and Oriented x 3, Strength and sensation are intact, follows commands Psych: euthymic mood, full affect  Wt Readings from Last 3 Encounters:  07/07/17 236 lb 6.4 oz (107.2 kg)  06/09/17 239 lb 12.8 oz (108.8 kg)  05/26/17 241 lb (109.3 kg)      Studies/Labs Reviewed:   EKG:  EKG was ordered today and  personally reviewed by me and  demonstrates NSR 68bpm with occasional PVCs, nonspecific TW changes I, avL, no significant change from prior.  Recent Labs: 01/13/2017: ALT 15; Hemoglobin 13.0; Platelets 162; TSH 4.150 06/16/2017: BUN 45; Creatinine, Ser 2.33; Potassium 4.2; Sodium 145   Lipid Panel    Component Value Date/Time   CHOL 138 01/13/2017 0914   TRIG 118 01/13/2017 0914   HDL 40 01/13/2017 0914   CHOLHDL 4.5 12/13/2014 0423   VLDL 25 12/13/2014 0423   LDLCALC 74 01/13/2017 0914    Additional studies/ records that were reviewed today include: Summarized above.    ASSESSMENT & PLAN:   1. Shortness of breath - suspect multifactorial but it's not entirely clear cut which is the primary source of dyspnea. Differential includes worsening CHF, ischemia, COPD, deconditioning. He has been more sedentary in the last year which is certainly not helping.He also has unrelated symptoms which I've urged him to speak to PCP/GI about, including continued chronic abdominal pain x 20 years as well as sensation of fullness in his throat at times as if food or water is getting stuck. He does have prior history of esophageal dilation/stricture. He was taken off ACEI in the last few months and I wonder if perhaps he is having more HF symptoms related to the decreased afterload reduction. I feel that more information is needed. Will obtain CXR, BNP, and update his CBC. Will also repeat echocardiogram. If LV dysfunction has worsened, repeated ischemic assessment may be necessary. It is not clear how much his frequent PVCs are playing a role. He reports a long history of this. I can see they were present on prior EKGs intermittently. I will plan to review with Dr. Angelena Form once more information has been gathered. Will continue present dose of Lasix but likely may need additional diuretic given his edema. 2. Chronic combined CHF - see above. Not on ACEI per renal. Therefore not a spironolactone/ARB/ARNI  candidate either. Await echo. If EF remains abnormal, would consider changing amlodipine to nitrates/hydralazine for optimization. Reviewed 2g sodium restriction, 2L fluid restriction, daily weights with patient. I suspect he gets a lot of undocumented salt in his diet. 3. CAD - as above. Continue ASA, BB, statin, Plavix for now. 4. PVCs - continue BB. Check lytes and thyroid. If these remain frequent and no obvious other etiology for dyspnea is found, could consider Holter to assess PVC burden. 5. Essential HTN - follow BP for now. 6. [name] - repeat BMET today. 7. AAA - although I doubt explicitly related to recent symptoms, he is overdue for follow-up. Will arrange. 8. Renal artery stenosis - has not been evaluated recently. In absence of uncontrolled BP, will hold off for now given probable lack of benefit even if it needed to be addressed.  Disposition: F/u with Dr. McAlhany/care team APP in 2 weeks.   Medication Adjustments/Labs and Tests Ordered: Current medicines are reviewed at length with the patient today.  Concerns regarding medicines are outlined above. Medication changes, Labs and Tests ordered today are summarized above and listed in the Patient Instructions accessible in Encounters.   Signed, Charlie Pitter, PA-C  07/07/2017 4:29 PM    [facility] [address] [place], [place]  [address] Phone: [phone]; Fax: [phone]

## 2017-07-07 NOTE — Telephone Encounter (Signed)
Spoke with patients wife and let her know she was to go to [facility] for CXR.  She got confused and could not find location.  Will go tomorrow for CXR.

## 2017-07-08 ENCOUNTER — Emergency Department (HOSPITAL_COMMUNITY)
Admission: EM | Admit: 2017-07-08 | Discharge: 2017-07-08 | Disposition: A | Discharge status: Home / Self Care | Payer: Medicare Other | Attending: Emergency Medicine | Admitting: Emergency Medicine

## 2017-07-08 ENCOUNTER — Telehealth: Payer: Self-pay | Admitting: Physician Assistant

## 2017-07-08 ENCOUNTER — Emergency Department (HOSPITAL_COMMUNITY): Payer: Medicare Other

## 2017-07-08 ENCOUNTER — Encounter (HOSPITAL_COMMUNITY): Payer: Self-pay

## 2017-07-08 ENCOUNTER — Other Ambulatory Visit: Payer: Self-pay

## 2017-07-08 ENCOUNTER — Other Ambulatory Visit: Payer: Self-pay | Admitting: Physician Assistant

## 2017-07-08 DIAGNOSIS — R1084 Generalized abdominal pain: Secondary | ICD-10-CM | POA: Diagnosis not present

## 2017-07-08 DIAGNOSIS — R0609 Other forms of dyspnea: Secondary | ICD-10-CM | POA: Diagnosis not present

## 2017-07-08 DIAGNOSIS — I714 Abdominal aortic aneurysm, without rupture, unspecified: Secondary | ICD-10-CM

## 2017-07-08 DIAGNOSIS — J449 Chronic obstructive pulmonary disease, unspecified: Secondary | ICD-10-CM | POA: Insufficient documentation

## 2017-07-08 DIAGNOSIS — Z951 Presence of aortocoronary bypass graft: Secondary | ICD-10-CM | POA: Diagnosis not present

## 2017-07-08 DIAGNOSIS — N184 Chronic kidney disease, stage 4 (severe): Secondary | ICD-10-CM | POA: Insufficient documentation

## 2017-07-08 DIAGNOSIS — E1122 Type 2 diabetes mellitus with diabetic chronic kidney disease: Secondary | ICD-10-CM | POA: Diagnosis not present

## 2017-07-08 DIAGNOSIS — I701 Atherosclerosis of renal artery: Secondary | ICD-10-CM

## 2017-07-08 DIAGNOSIS — G8929 Other chronic pain: Secondary | ICD-10-CM | POA: Insufficient documentation

## 2017-07-08 DIAGNOSIS — I2510 Atherosclerotic heart disease of native coronary artery without angina pectoris: Secondary | ICD-10-CM | POA: Diagnosis not present

## 2017-07-08 DIAGNOSIS — I5042 Chronic combined systolic (congestive) and diastolic (congestive) heart failure: Secondary | ICD-10-CM | POA: Diagnosis not present

## 2017-07-08 DIAGNOSIS — Z79899 Other long term (current) drug therapy: Secondary | ICD-10-CM | POA: Insufficient documentation

## 2017-07-08 DIAGNOSIS — R0602 Shortness of breath: Secondary | ICD-10-CM | POA: Diagnosis present

## 2017-07-08 DIAGNOSIS — Z87891 Personal history of nicotine dependence: Secondary | ICD-10-CM | POA: Insufficient documentation

## 2017-07-08 DIAGNOSIS — K439 Ventral hernia without obstruction or gangrene: Secondary | ICD-10-CM | POA: Diagnosis not present

## 2017-07-08 DIAGNOSIS — R109 Unspecified abdominal pain: Secondary | ICD-10-CM | POA: Diagnosis not present

## 2017-07-08 DIAGNOSIS — I130 Hypertensive heart and chronic kidney disease with heart failure and stage 1 through stage 4 chronic kidney disease, or unspecified chronic kidney disease: Secondary | ICD-10-CM | POA: Diagnosis not present

## 2017-07-08 DIAGNOSIS — Z7982 Long term (current) use of aspirin: Secondary | ICD-10-CM | POA: Diagnosis not present

## 2017-07-08 DIAGNOSIS — Z955 Presence of coronary angioplasty implant and graft: Secondary | ICD-10-CM | POA: Diagnosis not present

## 2017-07-08 DIAGNOSIS — R072 Precordial pain: Secondary | ICD-10-CM | POA: Diagnosis not present

## 2017-07-08 LAB — URINALYSIS, ROUTINE W REFLEX MICROSCOPIC
BILIRUBIN URINE: NEGATIVE
Glucose, UA: NEGATIVE mg/dL
Hgb urine dipstick: NEGATIVE
KETONES UR: NEGATIVE mg/dL
Leukocytes, UA: NEGATIVE
NITRITE: NEGATIVE
Protein, ur: NEGATIVE mg/dL
Specific Gravity, Urine: 1.014 (ref 1.005–1.030)
pH: 5 (ref 5.0–8.0)

## 2017-07-08 LAB — CBC
HCT: 40.2 % (ref 39.0–52.0)
HEMOGLOBIN: 12.3 g/dL — AB (ref 13.0–17.7)
Hematocrit: 38.5 % (ref 37.5–51.0)
Hemoglobin: 12.9 g/dL — ABNORMAL LOW (ref 13.0–17.0)
MCH: 27.9 pg (ref 26.6–33.0)
MCH: 28.5 pg (ref 26.0–34.0)
MCHC: 31.9 g/dL (ref 31.5–35.7)
MCHC: 32.1 g/dL (ref 30.0–36.0)
MCV: 87 fL (ref 79–97)
MCV: 88.7 fL (ref 78.0–100.0)
PLATELETS: 160 10*3/uL (ref 150–379)
Platelets: 154 10*3/uL (ref 150–400)
RBC: 4.41 x10E6/uL (ref 4.14–5.80)
RBC: 4.53 MIL/uL (ref 4.22–5.81)
RDW: 15.5 % — ABNORMAL HIGH (ref 12.3–15.4)
RDW: 14.6 % (ref 11.5–15.5)
WBC: 6.4 10*3/uL (ref 3.4–10.8)
WBC: 7 10*3/uL (ref 4.0–10.5)

## 2017-07-08 LAB — COMPREHENSIVE METABOLIC PANEL
A/G RATIO: 2 (ref 1.2–2.2)
ALBUMIN: 4 g/dL (ref 3.5–5.0)
ALK PHOS: 145 IU/L — AB (ref 39–117)
ALK PHOS: 136 U/L — AB (ref 38–126)
ALT: 19 IU/L (ref 0–44)
ALT: 19 U/L (ref 17–63)
ANION GAP: 11 (ref 5–15)
AST: 21 IU/L (ref 0–40)
AST: 25 U/L (ref 15–41)
Albumin: 4.3 g/dL (ref 3.5–4.8)
BILIRUBIN TOTAL: 1.7 mg/dL — AB (ref 0.3–1.2)
BUN/Creatinine Ratio: 14 (ref 10–24)
BUN: 36 mg/dL — AB (ref 8–27)
BUN: 35 mg/dL — AB (ref 6–20)
Bilirubin Total: 1.3 mg/dL — ABNORMAL HIGH (ref 0.0–1.2)
CALCIUM: 9.6 mg/dL (ref 8.9–10.3)
CO2: 23 mmol/L (ref 20–29)
CO2: 24 mmol/L (ref 22–32)
Calcium: 9.3 mg/dL (ref 8.6–10.2)
Chloride: 106 mmol/L (ref 96–106)
Chloride: 107 mmol/L (ref 101–111)
Creatinine, Ser: 2.57 mg/dL — ABNORMAL HIGH (ref 0.76–1.27)
Creatinine, Ser: 2.55 mg/dL — ABNORMAL HIGH (ref 0.61–1.24)
GFR calc Af Amer: 27 mL/min/{1.73_m2} — ABNORMAL LOW (ref >59)
GFR calc Af Amer: 27 mL/min — ABNORMAL LOW (ref >60)
GFR calc non Af Amer: 24 mL/min/{1.73_m2} — ABNORMAL LOW (ref >59)
GFR, EST NON AFRICAN AMERICAN: 23 mL/min — AB (ref >60)
GLUCOSE: 85 mg/dL (ref 65–99)
GLUCOSE: 135 mg/dL — AB (ref 65–99)
Globulin, Total: 2.1 g/dL (ref 1.5–4.5)
POTASSIUM: 4.4 mmol/L (ref 3.5–5.2)
Potassium: 4.2 mmol/L (ref 3.5–5.1)
Sodium: 144 mmol/L (ref 134–144)
Sodium: 142 mmol/L (ref 135–145)
TOTAL PROTEIN: 6.4 g/dL (ref 6.0–8.5)
TOTAL PROTEIN: 6.5 g/dL (ref 6.5–8.1)

## 2017-07-08 LAB — BRAIN NATRIURETIC PEPTIDE: B Natriuretic Peptide: 148.4 pg/mL — ABNORMAL HIGH (ref 0.0–100.0)

## 2017-07-08 LAB — LIPASE, BLOOD: Lipase: 34 U/L (ref 11–51)

## 2017-07-08 LAB — PRO B NATRIURETIC PEPTIDE: NT-PRO BNP: 569 pg/mL — AB (ref 0–376)

## 2017-07-08 LAB — I-STAT TROPONIN, ED: TROPONIN I, POC: 0.01 ng/mL (ref 0.00–0.08)

## 2017-07-08 LAB — TSH: TSH: 2.65 u[IU]/mL (ref 0.450–4.500)

## 2017-07-08 LAB — MAGNESIUM: MAGNESIUM: 2.4 mg/dL — AB (ref 1.6–2.3)

## 2017-07-08 MED ORDER — FUROSEMIDE 20 MG PO TABS
40.0000 mg | ORAL_TABLET | Freq: Once | ORAL | Status: AC
  Administered 2017-07-08: 40 mg via ORAL
  Filled 2017-07-08: qty 2

## 2017-07-08 NOTE — Telephone Encounter (Signed)
Per Melina Copa, PA-C, and Dr. Angelena Form, pt has been advised to report to the ER due his new worsening symptoms and worsening lab values.  Spoke with wife, [name], [place] on file, and she has been made aware.

## 2017-07-08 NOTE — ED Notes (Signed)
Domenic Moras, PA at bedside at this time.

## 2017-07-08 NOTE — ED Triage Notes (Signed)
Pt states abdominal pain with increased shortness of breath X2 months. Pt states he has had some intermittent chest burning. Pt has a significant cardiac hx. Skin warm and dry, no distress noted.

## 2017-07-08 NOTE — Telephone Encounter (Signed)
New message      Called to give pt appt day/times for echo and abd aorta ultrasound.  He said that he "does not feel good".  Patient states that he was sweating all night last night, stomach cramping and his chest was "burning" last night.  This morning, pt states that his chest no longer is "burning" but his stomach is hurting and he "just does not feel good".  He was not able to get his x-ray yesterday because he got there too late.  He will go this afternoon after his wife gets off work.  Pt said he may need to go to the hospital because he does not feel well. Pt states that they will come by here to get test appointments after he gets his x-ray. Please advise

## 2017-07-08 NOTE — Discharge Instructions (Addendum)
Please follow up with your doctor for further management of your condition.  Continue taking Lasix and Coreg as previously prescribed.  Return if you have any concerns. Follow up with your doctor for further management of your recurrent abdominal pain.   During the workup we noted incidental findings on your imaging that would require you to follow-up with your regular doctor for further evaluation/management: 1. 9 x 9 mm nodular opacity in the inferior lingula. Consider one of  the following in 3 months for both low-risk and high-risk  individuals: (a) repeat chest CT, (b) follow-up PET-CT, or (c)  tissue sampling. This recommendation follows the consensus  statement: Guidelines for Management of Incidental Pulmonary Nodules  Detected on CT Images: From the Fleischner Society 2017; Radiology  2017; 284:228-243.   2. Multifocal aortoiliac and femoral arterial atherosclerosis as  well as multiple foci of mesenteric arterial atherosclerosis.  Dilatation of the distal abdominal aorta with a maximum transverse  diameter of 2.9 x 2.6 cm. Ectatic abdominal aorta at risk for  aneurysm development. Recommend followup by ultrasound in 5 years.  This recommendation follows ACR consensus guidelines: White Paper of  the ACR Incidental Findings Committee II on Vascular Findings. J Am  Coll Radiol 2013; 10:789-794.

## 2017-07-08 NOTE — ED Provider Notes (Signed)
[facility] EMERGENCY DEPARTMENT Provider Note   CSN: [id number] Arrival date & time: 07/08/17  1150     History   Chief Complaint Chief Complaint  Patient presents with  . Abdominal Pain  . Shortness of Breath    HPI Ronald [name] is a 72 y.o. male.  HPI   72 year old male with history of CAD status post CABG, CHF with an EF of 40-45%, chronic low back pain, COPD, GERD, AAA, diabetes presenting at the recommendation of his cardiologist for evaluation of shortness of breath.  Patient report for the past 2 months he has had exertional shortness of breath worsening with activity.  No significant shortness of breath at rest.  He did notice some increased fluid gain his legs.  He has been taking Lasix as prescribed for the past month.  He denies any associated cough, hemoptysis, or fever.  No lightheadedness or dizziness or significant chest pain.  Furthermore, patient also complaining of intermittent abdominal pain for the past 6 weeks.  Described pain as an achy sensation waxing and waning but present on a daily basis.  Denies any associated nausea or vomiting or diarrhea bloody stool or black tarry stool or change in bowel movement.  Patient mentioned symptoms started shortly after he was given steroid for his neck pain 2 months ago.  He felt the steroid may have upset his stomach.  He denies any postprandial pain.  He was seen by his cardiologist for follow-up yesterday.  To contact him today to notify that his kidney and liver function is abnormal and he should be seen in the ER for further evaluation of his health.  Patient also admits to significant cardiac history with multiple CABG and bypass.  Also mention having AAA and is scheduled to have a CT scan in the upcoming month.  Past Medical History:  Diagnosis Date  . AAA (abdominal aortic aneurysm) ([place])    a. 3cm by [place] 2015.  [name] Arthritis    "hips; back" (12/13/2014)  . CAD (coronary artery disease) 2007   a. s/p  CABG- IMA-LAD, VG-Cx, VG-RCA, VG-diag in 1999. B. sp redo CABG- VG-OM, VG-RCA in 2007 due to VG disease. c. NSTEMI 11/2014 s/p DES to SVG-OM from the Y graft.d. PTCA/DES x 1 distal body of SVG to Diagonal.09/2015  . Chronic combined systolic and diastolic CHF (congestive heart failure) ([place])    a. remote EF 40-45% in 2006. b. Normal EF 2014. b. Echo 07/2016 EF 45-50%, grade 1 DD.  [name] Chronic lower back pain   . CKD (chronic kidney disease), stage IV ([place])   . COPD (chronic obstructive pulmonary disease) ([place])   . Deafness in left ear   . Degenerative disc disease, lumbar   . Dilated cardiomyopathy ([place]) 10/07/2015  . Emphysema   . Esophageal stricture 07/02/1998   EGD  . GERD (gastroesophageal reflux disease)   . History of gout    "last flareup was in 2007" (12/13/2014)  . History of hiatal hernia   . Hyperlipidemia   . Hypertension   . Ischemic cardiomyopathy 2006   EF 40% to 50% by 2D echo in 2006;  Echo 12/31/12: Mild LVH, EF 50-55%, normal wall motion.   [name] PVC's (premature ventricular contractions)   . Renal artery stenosis ([place])    a. noted on CT 2008.  . Type II diabetes mellitus ([place])    Diet control   . Walking pneumonia 1990's    Patient Active Problem List   Diagnosis Date  Noted  . Atherosclerosis of native arteries of extremity with intermittent claudication ([place]) 06/25/2016  . Acute renal failure superimposed on stage 3 chronic kidney disease ([place]) 10/07/2015  . Dilated cardiomyopathy ([place]) 10/07/2015  . Coronary artery disease involving native coronary artery of native heart with unstable angina pectoris ([place])   . Allergic rhinitis 08/19/2015  . NSTEMI (non-ST elevated myocardial infarction) ([place]) 12/13/2014  . AA (aortic aneurysm) ([place]) 10/04/2014  . Benign enlargement of prostate 10/04/2014  . Arteriosclerosis of coronary artery 10/04/2014  . Diabetes mellitus, type 2 ([place]) 10/04/2014  . Acid reflux 10/04/2014  . 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 HLD (hyperlipidemia)  10/04/2014  . BP (high blood pressure) 10/04/2014  . Osteoarthrosis 10/04/2014  . Adult BMI 30+ 10/04/2014  . 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 Urge incontinence of urine 10/24/2012  . 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 CKD (chronic kidney disease) stage 3, GFR 30-59 ml/min ([facility])   . Abdominal pain 11/09/2010  . Shortness of breath 11/12/2009  . Renal artery stenosis    . Mixed hyperlipidemia 09/04/2008  . History of redo bypass grafting 09/04/2008  . Diabetes mellitus with nephropathy [facility])     Past Surgical History:  Procedure Laterality Date  . CARDIAC CATHETERIZATION  "several"  . CARDIAC CATHETERIZATION N/A 12/13/2014   Procedure: Left Heart Cath and Coronary Angiography;  Surgeon: Jettie Booze, MD;  Location: [facility];  Service: Cardiovascular;  Laterality: N/A;  . CARDIAC CATHETERIZATION  12/13/2014   Procedure: Coronary Stent Intervention;  Surgeon: Jettie Booze, MD;  Location: [facility];  Service: Cardiovascular;;  . CARDIAC CATHETERIZATION N/A 10/07/2015   Procedure: Left Heart Cath and Cors/Grafts Angiography;  Surgeon: Burnell Blanks, MD;  Location: [facility];  Service: Cardiovascular;  Laterality: N/A;  . CARDIAC CATHETERIZATION N/A 10/07/2015   Procedure: Coronary Stent Intervention;  Surgeon: Burnell Blanks, MD;  Location: [facility];  Service: Cardiovascular;  Laterality: N/A;  . CORONARY ANGIOPLASTY  "several"  . CORONARY ANGIOPLASTY WITH STENT PLACEMENT  2005; 12/13/2014   "2; 1"  . CORONARY ARTERY BYPASS GRAFT  1996   CABG X5  . CORONARY ARTERY BYPASS GRAFT  March 2007   CABG X3  . ESOPHAGOGASTRODUODENOSCOPY (EGD) WITH ESOPHAGEAL DILATION  2000  . GREEN LIGHT LASER TURP (TRANSURETHRAL RESECTION OF PROSTATE  2000's   "not cancerous"  . HERNIA REPAIR    . LAPAROSCOPIC CHOLECYSTECTOMY    . LUNG SURGERY  1996   "S/P  CABG, had to put staple in lung after it had collapsed"  . UMBILICAL HERNIA REPAIR     w/chole       Home Medications    Prior to Admission medications   Medication Sig Start Date End Date Taking? Authorizing Provider  acetaminophen (TYLENOL) 650 MG CR tablet Take 1,300 mg by mouth daily as needed for pain.    [provider]  allopurinol (ZYLOPRIM) 100 MG tablet TAKE 1 TABLET BY MOUTH AT  BEDTIME 12/13/16   [name]., MD  amLODipine (NORVASC) 5 MG tablet TAKE 1 TABLET BY MOUTH  DAILY Patient taking differently: No sig reported 08/28/16   [name]., MD  aspirin 81 MG tablet Take 81 mg by mouth every morning.     [provider]  atorvastatin (LIPITOR) 40 MG tablet Take 1 tablet (40 mg total) by mouth daily. 12/13/16   Burnell Blanks,  MD  carvedilol (COREG) 25 MG tablet Take 1 tablet (25 mg total) by mouth 2 (two) times daily. 06/09/17 09/07/17  Burnell Blanks, MD  clopidogrel (PLAVIX) 75 MG tablet TAKE 1 TABLET BY MOUTH  DAILY 11/16/16   Dunn, Nedra Hai, PA-C  furosemide (LASIX) 40 MG tablet TAKE 1 TABLET BY MOUTH EVERY MORNING TAKE AS NEEDED FOR LEG SWELLING 05/05/17   [provider]  magnesium oxide (MAG-OX) 400 (241.3 Mg) MG tablet TAKE 1 TABLET BY MOUTH TWO TIMES DAILY. 01/24/17   [name]., MD  Multiple Vitamin (MULTIVITAMIN) tablet Take 1 tablet by mouth every morning.     [provider]  nitroGLYCERIN (NITROSTAT) 0.4 MG SL tablet Place 0.4 mg under the tongue every 5 (five) minutes as needed for chest pain (Up to 3 times).    [provider]  nystatin cream (MYCOSTATIN) Apply 1 application topically 2 (two) times daily as needed for dry skin. 05/26/17   [name]., MD  pantoprazole (PROTONIX) 40 MG tablet Take 1 tablet (40 mg total) by mouth daily. 10/08/15   Arbutus Leas, NP  ranitidine (ZANTAC) 150 MG tablet Take 1 tablet by mouth at bedtime. 09/28/16   [provider]    sodium bicarbonate 650 MG tablet Take 650 mg by mouth 2 (two) times daily.    [provider]    Family History Family History  Problem Relation Age of Onset  . Heart attack Mother        MI  . Stroke Mother   . Heart disease Mother   . Hypertension Mother   . Hyperlipidemia Mother   . Heart disease Father   . Rheumatic fever Father   . Colon cancer Neg Hx     Social History Social History   Tobacco Use  . Smoking status: Former Smoker    Packs/day: 3.00    Years: 20.00    Pack years: 60.00    Types: Cigarettes    Last attempt to quit: 07/25/1986    Years since quitting: 30.9  . Smokeless tobacco: Never Used  Substance Use Topics  . Alcohol use: No    Alcohol/week: 0.0 oz  . Drug use: No     Allergies   Predicort [prednisolone]; Ciprofloxacin; Hydrochlorothiazide; Hydrocodone; Hydrocodone-acetaminophen; Hydrocodone-acetaminophen; Loratadine; Sulfa antibiotics; Sulfacetamide sodium; Sulfasalazine; Hydrocodone-acetaminophen; and Penicillins   Review of Systems Review of Systems  All other systems reviewed and are negative.    Physical Exam Updated Vital Signs BP (!) 152/58   Pulse (!) 33   Temp 98 F (36.7 C) (Oral)   Resp 17   SpO2 98%   Physical Exam  Constitutional: He appears well-developed and well-nourished. No distress.  Elderly male well-appearing in no acute discomfort  HENT:  Head: Atraumatic.  Eyes: Conjunctivae and EOM are normal. Pupils are equal, round, and reactive to light.  Neck: Neck supple.  Cardiovascular: Exam reveals no friction rub.  Irregularly irregular heart rhythm  Pulmonary/Chest: Effort normal and breath sounds normal. He has no wheezes. He has no rales.  Abdominal: Normal appearance. He exhibits distension.  Abdomen is mildly distended with mild generalized tenderness but no guarding or rebound tenderness.  No hernia noted.  Negative Murphy signs, no pain at McBurney's point.  Musculoskeletal: He exhibits edema  (Trace edema to bilateral lower extremity with intact distal pedal pulses.).  Neurological: He is alert.  Skin: No rash noted.  Psychiatric: He has a normal mood and affect.  Nursing note and  vitals reviewed.    ED Treatments / Results  Labs (all labs ordered are listed, but only abnormal results are displayed) Labs Reviewed  COMPREHENSIVE METABOLIC PANEL - Abnormal; Notable for the following components:      Result Value   Glucose, Bld 135 (*)    BUN 35 (*)    Creatinine, Ser 2.55 (*)    Alkaline Phosphatase 136 (*)    Total Bilirubin 1.7 (*)    GFR calc non Af Amer 23 (*)    GFR calc Af Amer 27 (*)    All other components within normal limits  CBC - Abnormal; Notable for the following components:   Hemoglobin 12.9 (*)    All other components within normal limits  BRAIN NATRIURETIC PEPTIDE - Abnormal; Notable for the following components:   B Natriuretic Peptide 148.4 (*)    All other components within normal limits  LIPASE, BLOOD  URINALYSIS, ROUTINE W REFLEX MICROSCOPIC  I-STAT TROPONIN, ED    EKG  EKG Interpretation  Date/Time:  Friday July 08 2017 12:37:21 EST Ventricular Rate:  66 PR Interval:  156 QRS Duration: 90 QT Interval:  420 QTC Calculation: 440 R Axis:   77 Text Interpretation:  Sinus rhythm with frequent Premature ventricular complexes in a pattern of bigeminy Otherwise normal ECG changed from prior ecg Confirmed by Jola Schmidt [phone]) on 07/08/2017 3:27:57 PM       Radiology Dg Chest 2 View  Result Date: 07/08/2017 CLINICAL DATA:  Mid sternal chest burning over the last 2 days. EXAM: CHEST  2 VIEW COMPARISON:  07/13/2016 FINDINGS: Artifact overlies the chest. Previous median sternotomy and CABG. Chronic cardiomegaly and aortic atherosclerosis. Chronic scarring in the lower lungs. Chronic prominence of the epicardial fat pads. No sign of acute edema, infiltrate, collapse or effusion. IMPRESSION: No acute findings. Previous CABG. Cardiomegaly and  aortic atherosclerosis. Pulmonary scarring. Electronically Signed   By: Nelson Chimes M.D.   On: 07/08/2017 13:42    Procedures Procedures (including critical care time)  Medications Ordered in ED Medications  furosemide (LASIX) tablet 40 mg (not administered)     Initial Impression / Assessment and Plan / ED Course  I have reviewed the triage vital signs and the nursing notes.  Pertinent labs & imaging results that were available during my care of the patient were reviewed by me and considered in my medical decision making (see chart for details).     BP (!) 152/58   Pulse (!) 33   Temp 98 F (36.7 C) (Oral)   Resp 17   SpO2 98%    Final Clinical Impressions(s) / ED Diagnoses   Final diagnoses:  Exertional dyspnea  Chronic abdominal pain    ED Discharge Orders    None     1:13 PM Patient here with chronic symptoms of shortness of breath and abdominal discomfort.  The symptom has been ongoing for the past 2 months.  He is well-appearing.  He does have multiple comorbidity including CHF, GERD, CAD.  Workup initiated.  3:34 PM Labs today is essentially similar to labs that was obtained yesterday.  Evidence of chronic kidney disease, unchanged.  Elevated BNP of 148.4.  Chest x-ray show no acute finding, no acute edema or effusion noted.  EKG with bigeminy, similar to prior.  At this time no significant changes noted.  Upon review recent documentation from yesterday's visit, patient shortness of breath appears to be multifactorial.  It may be related to his bigeminy as well as his underlying  CHF.  Several medication was being adjusted including Lasix, and Coreg.  Will likely require that sometimes for these medication to provide adequate relief.  His chronic abdominal pain is also being evaluated today.  An abdominal pelvic CT scan is pending.  If no acute finding, anticipate discharge with close follow-up with his provider for further management.  No evidence of hypoxia  currently.  Care discussed with Dr. Venora Maples.  4:31 PM Pt sign out to Dr. Leonette Monarch who will dispo pt pending CT result.     Ronald Moras, PA-C 07/08/17 1632    Fatima Blank, MD 07/08/17 [phone]

## 2017-07-08 NOTE — Telephone Encounter (Signed)
New Message   Patients wife [name] is calling on behalf of husband. She is wanting to discuss the upcoming test the patient is to have. Her concern is him having to drink contrast material and how it will effect his kinds. Please call to discuss.

## 2017-07-08 NOTE — Telephone Encounter (Signed)
Called wife back, DPR on file.  She was wanting to verify pt appts. She thanked me for the call back.

## 2017-07-08 NOTE — ED Notes (Signed)
Pt notified of the need of a urine sample. Urinal left at bedside.

## 2017-07-11 ENCOUNTER — Ambulatory Visit (HOSPITAL_COMMUNITY)
Admission: RE | Admit: 2017-07-11 | Discharge: 2017-07-11 | Disposition: A | Discharge status: Home / Self Care | Payer: Medicare Other | Source: Ambulatory Visit | Attending: Internal Medicine | Admitting: Internal Medicine

## 2017-07-11 DIAGNOSIS — I714 Abdominal aortic aneurysm, without rupture, unspecified: Secondary | ICD-10-CM

## 2017-07-11 DIAGNOSIS — I350 Nonrheumatic aortic (valve) stenosis: Secondary | ICD-10-CM | POA: Diagnosis not present

## 2017-07-11 DIAGNOSIS — I771 Stricture of artery: Secondary | ICD-10-CM | POA: Insufficient documentation

## 2017-07-11 DIAGNOSIS — I701 Atherosclerosis of renal artery: Secondary | ICD-10-CM | POA: Diagnosis not present

## 2017-07-14 ENCOUNTER — Ambulatory Visit (HOSPITAL_COMMUNITY): Payer: Medicare Other | Attending: Internal Medicine

## 2017-07-14 ENCOUNTER — Other Ambulatory Visit: Payer: Self-pay

## 2017-07-14 DIAGNOSIS — I130 Hypertensive heart and chronic kidney disease with heart failure and stage 1 through stage 4 chronic kidney disease, or unspecified chronic kidney disease: Secondary | ICD-10-CM | POA: Diagnosis not present

## 2017-07-14 DIAGNOSIS — I493 Ventricular premature depolarization: Secondary | ICD-10-CM

## 2017-07-14 DIAGNOSIS — I10 Essential (primary) hypertension: Secondary | ICD-10-CM | POA: Diagnosis not present

## 2017-07-14 DIAGNOSIS — I071 Rheumatic tricuspid insufficiency: Secondary | ICD-10-CM | POA: Diagnosis not present

## 2017-07-14 DIAGNOSIS — I714 Abdominal aortic aneurysm, without rupture, unspecified: Secondary | ICD-10-CM

## 2017-07-14 DIAGNOSIS — I701 Atherosclerosis of renal artery: Secondary | ICD-10-CM | POA: Insufficient documentation

## 2017-07-14 DIAGNOSIS — N184 Chronic kidney disease, stage 4 (severe): Secondary | ICD-10-CM | POA: Diagnosis not present

## 2017-07-14 DIAGNOSIS — I5042 Chronic combined systolic (congestive) and diastolic (congestive) heart failure: Secondary | ICD-10-CM | POA: Diagnosis not present

## 2017-07-14 DIAGNOSIS — I2510 Atherosclerotic heart disease of native coronary artery without angina pectoris: Secondary | ICD-10-CM | POA: Insufficient documentation

## 2017-07-20 ENCOUNTER — Encounter: Payer: Self-pay | Admitting: Cardiology

## 2017-07-20 ENCOUNTER — Ambulatory Visit: Payer: Medicare Other | Admitting: Cardiology

## 2017-07-20 VITALS — BP 132/72 | HR 69 | Ht 69.0 in | Wt 234.0 lb

## 2017-07-20 DIAGNOSIS — I493 Ventricular premature depolarization: Secondary | ICD-10-CM

## 2017-07-20 DIAGNOSIS — R0609 Other forms of dyspnea: Secondary | ICD-10-CM

## 2017-07-20 NOTE — Patient Instructions (Signed)
Medication Instructions:  Your physician recommends that you continue on your current medications as directed. Please refer to the Current Medication list given to you today.   Labwork: None Ordered   Testing/Procedures: Your physician has recommended that you wear a holter monitor. Holter monitors are medical devices that record the heart's electrical activity. Doctors most often use these monitors to diagnose arrhythmias. Arrhythmias are problems with the speed or rhythm of the heartbeat. The monitor is a small, portable device. You can wear one while you do your normal daily activities. This is usually used to diagnose what is causing palpitations/syncope (passing out).   Follow-Up: Your physician recommends that you schedule a follow-up appointment in: 4 weeks with Dr. Angelena Form or Lyda Jester, PA   If you need a refill on your cardiac medications before your next appointment, please call your pharmacy.   Thank you for choosing [facility]! Christen Bame, RN [phone]

## 2017-07-20 NOTE — Progress Notes (Signed)
07/20/2017 Ronald [name] [facility]   Nov 09, 1945  [id number]  Primary Physician Ronald [name]., MD Primary Cardiologist: Dr. Angelena [name]   Reason for Visit/CC: F/u for Dyspnea   HPI:  Ronald [name] is a 72 y.o. male, followed by Dr. Angelena [name], who presents back to clinic today for f/u given recent dyspnea. He has known CAD s/p CABG in 1996 (LIMA-LAD, SVG-circumflex, SVG-RCA, SVG-diagonal) with redo bypass in 2007 (SVG-OM, SVG-RCA), subsequent PCIs (DES to SVG-OM 2016, DES to SVG-diagonal in 2017), ischemic cardiomyopathy/chronic combined CHF, CKD III-IV, DM 2, HTN, HLD, renal artery stenosis by CT 2008, AAA, COPD, GERD and PVCs. He has had a h/o flucutating LV dysfunction dating back to echo in 2006 when EF was 40-45%. 2D echo in 2014 showed normal LV systolic function. He also has notable truncal obesity and is very sedentary, 2/2 chronic LBP.   He was seen recently by Ronald Copa, PA-C, on 07/07/17 for evaluation for SOB. It was felt that the etiology was likely multifactorial, however it was not entirely clear which was the primary source of his dyspnea. Differential diagnosis included worsening CHF, coronary ischemia, COPD and deconditioning. Pt was ordered to get a BNP and 2D echo. BNP was only slightly abnormal at 148 and 2D echo showed normal LVEF at 55-60%, w/ G1DD and no significant valvular disease. Thus it was not felt that acute CHF was the primary cause of his dyspnea. Pt was also evaluated in the ED the day after his office visit for dyspnea and CXR showed no acute findings. No edema or infiltrate. Some pulmonary scarring was noted. Chest CT showed a pulmonary nodule w/ f/u recommended by ED MD. He was also noted to have some PVCs on telemetry while in the ED. Pt did not require admission. He was released from the ED and advised to resume f/u in our clinic.   He is back to clinic today with his wife. He notes that he still has chronic dyspnea but a bit improved since his last OV. He denies CP.  Ronald Copa, PA-C, has been in communication with Dr. Angelena [name] regarding pt and it has been recommended to consider nuclear stress testing and possible 48 hr monitor to assess for coronary ischemia and access PVC burden, as part of continued dyspnea w/u. These recommendations were discussed with pt and he would rather start with 48 hr monitor first before proceding with stress test.    2D Echo 07/14/17 Study Conclusions  - Left ventricle: The cavity size was mildly dilated. Systolic   function was normal. The estimated ejection fraction was in the   range of 55% to 60%. Doppler parameters are consistent with   abnormal left ventricular relaxation (grade 1 diastolic   dysfunction).  Vas [place] AAA 07/11/17 Abdominal Aorta: There is evidence of abnormal dilitation of the Mid Abdominal aorta. The largest aortic measurement is 2.8 cm. The largest aortic diameter remains essentially unchanged compared to prior exam. Previous diameter measurement was 3.0 cm obtained on 10/15.  Current Meds  Medication Sig  . acetaminophen (TYLENOL) 650 MG CR tablet Take 1,300 mg by mouth daily as needed for pain.  [name] allopurinol (ZYLOPRIM) 100 MG tablet TAKE 1 TABLET BY MOUTH AT  BEDTIME  . amLODipine (NORVASC) 5 MG tablet TAKE 1 TABLET BY MOUTH  DAILY (Patient taking differently: No sig reported)  . aspirin 81 MG tablet Take 81 mg by mouth every morning.   [name] atorvastatin (LIPITOR) 40 MG tablet Take 1 tablet (40 mg total) by  mouth daily.  . carvedilol (COREG) 25 MG tablet Take 1 tablet (25 mg total) by mouth 2 (two) times daily.  . clopidogrel (PLAVIX) 75 MG tablet TAKE 1 TABLET BY MOUTH  DAILY  . fluticasone (FLONASE) 50 MCG/ACT nasal spray Place 1 spray into both nostrils as needed for allergies or rhinitis.  . furosemide (LASIX) 40 MG tablet TAKE 1 TABLET BY MOUTH EVERY MORNING TAKE AS NEEDED FOR LEG SWELLING  . loratadine (CLARITIN) 10 MG tablet Take 10 mg by mouth daily as needed for allergies.  . magnesium oxide  (MAG-OX) 400 (241.3 Mg) MG tablet TAKE 1 TABLET BY MOUTH TWO TIMES DAILY.  . Multiple Vitamin (MULTIVITAMIN) tablet Take 1 tablet by mouth every morning.   . nitroGLYCERIN (NITROSTAT) 0.4 MG SL tablet Place 0.4 mg under the tongue every 5 (five) minutes as needed for chest pain (Up to 3 times).  . nystatin cream (MYCOSTATIN) Apply 1 application topically 2 (two) times daily as needed for dry skin.  . pantoprazole (PROTONIX) 40 MG tablet Take 1 tablet (40 mg total) by mouth daily.  . ranitidine (ZANTAC) 150 MG tablet Take 1 tablet by mouth at bedtime.  . sodium bicarbonate 650 MG tablet Take 650 mg by mouth 2 (two) times daily.   Current Facility-Administered Medications for the 07/20/17 encounter (Office Visit) with Ronald Pandy, PA-C  Medication  . 0.9 %  sodium chloride infusion   Allergies  Allergen Reactions  . Predicort [Prednisolone] Other (See Comments)    Stomach pain  . Ciprofloxacin     GI upset  . Hydrochlorothiazide Other (See Comments)    Dehydration  . Hydrocodone     Stomach upset  . Hydrocodone-Acetaminophen     Stomach upset  . Hydrocodone-Acetaminophen Nausea Only  . Loratadine Other (See Comments)    Other reaction(s): Unknown  . Sulfa Antibiotics Other (See Comments)    Cannot recall  . Sulfacetamide Sodium Other (See Comments)    Cannot recall  . Sulfasalazine     Other reaction(s): Other (See Comments) Cannot recall  . Hydrocodone-Acetaminophen Nausea Only  . Penicillins Hives and Rash    Has patient had a PCN reaction causing immediate rash, facial/tongue/throat swelling, SOB or lightheadedness with hypotension: YES Has patient had a PCN reaction causing severe rash involving mucus membranes or skin necrosis: NO Has patient had a PCN reaction that required hospitalization NO Has patient had a PCN reaction occurring within the last 10 years:NO If all of the above answers are "NO", then may proceed with Cephalosporin use.   Past Medical History:    Diagnosis Date  . AAA (abdominal aortic aneurysm) ([place])    a. 3cm by [place] 2015.  [name] Arthritis    "hips; back" (12/13/2014)  . CAD (coronary artery disease) 2007   a. s/p CABG- IMA-LAD, VG-Cx, VG-RCA, VG-diag in 1999. B. sp redo CABG- VG-OM, VG-RCA in 2007 due to VG disease. c. NSTEMI 11/2014 s/p DES to SVG-OM from the Y graft.d. PTCA/DES x 1 distal body of SVG to Diagonal.09/2015  . Chronic combined systolic and diastolic CHF (congestive heart failure) ([place])    a. remote EF 40-45% in 2006. b. Normal EF 2014. b. Echo 07/2016 EF 45-50%, grade 1 DD.  [name] Chronic lower back pain   . CKD (chronic kidney disease), stage IV ([place])   . COPD (chronic obstructive pulmonary disease) ([place])   . Deafness in left ear   . Degenerative disc disease, lumbar   . Dilated cardiomyopathy ([place]) 10/07/2015  .  Emphysema   . Esophageal stricture 07/02/1998   EGD  . GERD (gastroesophageal reflux disease)   . History of gout    "last flareup was in 2007" (12/13/2014)  . History of hiatal hernia   . Hyperlipidemia   . Hypertension   . Ischemic cardiomyopathy 2006   EF 40% to 50% by 2D echo in 2006;  Echo 12/31/12: Mild LVH, EF 50-55%, normal wall motion.   [name] PVC's (premature ventricular contractions)   . Renal artery stenosis ([place])    a. noted on CT 2008.  . Type II diabetes mellitus ([place])    Diet control   . Walking pneumonia 1990's   Family History  Problem Relation Age of Onset  . Heart attack Mother        MI  . Stroke Mother   . Heart disease Mother   . Hypertension Mother   . Hyperlipidemia Mother   . Heart disease Father   . Rheumatic fever Father   . Colon cancer Neg Hx    Past Surgical History:  Procedure Laterality Date  . CARDIAC CATHETERIZATION  "several"  . CARDIAC CATHETERIZATION N/A 12/13/2014   Procedure: Left Heart Cath and Coronary Angiography;  Surgeon: Jettie Booze, MD;  Location: [place] [facility];  Service: Cardiovascular;  Laterality: N/A;  . CARDIAC CATHETERIZATION  12/13/2014    Procedure: Coronary Stent Intervention;  Surgeon: Jettie Booze, MD;  Location: [place] [facility];  Service: Cardiovascular;;  . CARDIAC CATHETERIZATION N/A 10/07/2015   Procedure: Left Heart Cath and Cors/Grafts Angiography;  Surgeon: Burnell Blanks, MD;  Location: [place] [facility];  Service: Cardiovascular;  Laterality: N/A;  . CARDIAC CATHETERIZATION N/A 10/07/2015   Procedure: Coronary Stent Intervention;  Surgeon: Burnell Blanks, MD;  Location: [place] [facility];  Service: Cardiovascular;  Laterality: N/A;  . CORONARY ANGIOPLASTY  "several"  . CORONARY ANGIOPLASTY WITH STENT PLACEMENT  2005; 12/13/2014   "2; 1"  . CORONARY ARTERY BYPASS GRAFT  1996   CABG X5  . CORONARY ARTERY BYPASS GRAFT  March 2007   CABG X3  . ESOPHAGOGASTRODUODENOSCOPY (EGD) WITH ESOPHAGEAL DILATION  2000  . GREEN LIGHT LASER TURP (TRANSURETHRAL RESECTION OF PROSTATE  2000's   "not cancerous"  . HERNIA REPAIR    . LAPAROSCOPIC CHOLECYSTECTOMY    . LUNG SURGERY  1996   "S/P CABG, had to put staple in lung after it had collapsed"  . UMBILICAL HERNIA REPAIR     w/chole   Social History   Socioeconomic History  . Marital status: Married    Spouse name: Not on file  . Number of children: 2  . Years of education: Not on file  . Highest education level: Not on file  Social Needs  . Financial resource strain: Not on file  . Food insecurity - worry: Not on file  . Food insecurity - inability: Not on file  . Transportation needs - medical: Not on file  . Transportation needs - non-medical: Not on file  Occupational History  . Occupation: Retired  Tobacco Use  . Smoking status: Former Smoker    Packs/day: 3.00    Years: 20.00    Pack years: 60.00    Types: Cigarettes    Last attempt to quit: 07/25/1986    Years since quitting: 31.0  . Smokeless tobacco: Never Used  Substance and Sexual Activity  . Alcohol use: No    Alcohol/week: 0.0 oz  . Drug use: No  . Sexual activity: Not  Currently  Other Topics Concern  . Not on file  Social History Narrative   Did auto salvage work.     Review of Systems: General: negative for chills, fever, night sweats or weight changes.  Cardiovascular: negative for chest pain, dyspnea on exertion, edema, orthopnea, palpitations, paroxysmal nocturnal dyspnea or shortness of breath Dermatological: negative for rash Respiratory: negative for cough or wheezing Urologic: negative for hematuria Abdominal: negative for nausea, vomiting, diarrhea, bright red blood per rectum, melena, or hematemesis Neurologic: negative for visual changes, syncope, or dizziness All other systems reviewed and are otherwise negative except as noted above.   Physical Exam:  Blood pressure 132/72, pulse 69, height 5\' 9"  (1.753 m), weight 234 lb (106.1 kg), SpO2 97 %.  General appearance: alert, cooperative and no distress Neck: no carotid bruit and no JVD Lungs: clear to auscultation bilaterally Heart: regular rate and rhythm, S1, S2 normal, no murmur, click, rub or gallop Extremities: extremities normal, atraumatic, no cyanosis or edema Pulses: 2+ and symmetric Skin: Skin color, texture, turgor normal. No rashes or lesions Neurologic: Grossly normal  EKG no performed  -- personally reviewed   ASSESSMENT AND PLAN:   1. Dyspnea: recent echo showed normal LVEF and no valvular disease. BNP only mildly elevated in the 100s and CXR in the ED showed no acute disease/ no edema, infiltrate, colaspase or effusion. Pulmonary scarring however was mention and a possible pulmonary nodule was also noted on chest CT in the ED. Based on w/u, CHF is unlikely. Ronald Copa, PA-C, has been in communication with Dr. Angelena [name] regarding pt and it has been recommended to consider nuclear stress testing and possible 48 hr monitor to assess for coronary ischemia and access PVC burden, as part of continued dyspnea w/u. These recommendations were discussed with pt and he would rather  start with 48 hr monitor first before proceding with stress test. We will arrange for monitor and will determine need for stress testing based on findings. Suspect that underlying COPD and deconditioning/ obesity is also playing a role.   2. CAD: CAD s/p CABG in 1996 (LIMA-LAD, SVG-circumflex, SVG-RCA, SVG-diagonal) with redo bypass in 2007 (SVG-OM, SVG-RCA), subsequent PCIs (DES to SVG-OM 2016, DES to SVG-diagonal in 2017). He denies CP but has chronic dyspnea. As outlined above, we are considering NST but pt opting to wait until he undergoes evaluation with heart monitor first. In the absence of CP, I feel this is reasonable for now. Continue medical therapy.   3. H/o Ischemic CM: recent echo showed normal LVEF and G1DD. Continue medical therapy. No ACE/ARB given CKD. Volume overall appears stable.   4. AAA: stable by recent US, < 3 cm. Follow yearly.  5. HTN: controlled on current regimen.   Follow-Up after monitor.   Ronald [name], [facility] 07/20/2017 12:58 PM

## 2017-07-26 ENCOUNTER — Encounter (HOSPITAL_COMMUNITY): Payer: Self-pay | Admitting: Emergency Medicine

## 2017-07-26 ENCOUNTER — Emergency Department (HOSPITAL_COMMUNITY): Payer: Medicare Other

## 2017-07-26 ENCOUNTER — Other Ambulatory Visit: Payer: Self-pay

## 2017-07-26 DIAGNOSIS — N184 Chronic kidney disease, stage 4 (severe): Secondary | ICD-10-CM | POA: Diagnosis not present

## 2017-07-26 DIAGNOSIS — I130 Hypertensive heart and chronic kidney disease with heart failure and stage 1 through stage 4 chronic kidney disease, or unspecified chronic kidney disease: Secondary | ICD-10-CM | POA: Diagnosis present

## 2017-07-26 DIAGNOSIS — T82858A Stenosis of vascular prosthetic devices, implants and grafts, initial encounter: Secondary | ICD-10-CM | POA: Diagnosis present

## 2017-07-26 DIAGNOSIS — I714 Abdominal aortic aneurysm, without rupture: Secondary | ICD-10-CM | POA: Diagnosis not present

## 2017-07-26 DIAGNOSIS — Z880 Allergy status to penicillin: Secondary | ICD-10-CM

## 2017-07-26 DIAGNOSIS — E1121 Type 2 diabetes mellitus with diabetic nephropathy: Secondary | ICD-10-CM | POA: Diagnosis present

## 2017-07-26 DIAGNOSIS — I25110 Atherosclerotic heart disease of native coronary artery with unstable angina pectoris: Principal | ICD-10-CM | POA: Diagnosis present

## 2017-07-26 DIAGNOSIS — G8929 Other chronic pain: Secondary | ICD-10-CM | POA: Diagnosis not present

## 2017-07-26 DIAGNOSIS — E669 Obesity, unspecified: Secondary | ICD-10-CM | POA: Diagnosis present

## 2017-07-26 DIAGNOSIS — I5042 Chronic combined systolic (congestive) and diastolic (congestive) heart failure: Secondary | ICD-10-CM | POA: Diagnosis present

## 2017-07-26 DIAGNOSIS — I200 Unstable angina: Secondary | ICD-10-CM | POA: Diagnosis not present

## 2017-07-26 DIAGNOSIS — E785 Hyperlipidemia, unspecified: Secondary | ICD-10-CM | POA: Diagnosis not present

## 2017-07-26 DIAGNOSIS — Z951 Presence of aortocoronary bypass graft: Secondary | ICD-10-CM

## 2017-07-26 DIAGNOSIS — I25810 Atherosclerosis of coronary artery bypass graft(s) without angina pectoris: Secondary | ICD-10-CM | POA: Diagnosis present

## 2017-07-26 DIAGNOSIS — M109 Gout, unspecified: Secondary | ICD-10-CM | POA: Diagnosis present

## 2017-07-26 DIAGNOSIS — J439 Emphysema, unspecified: Secondary | ICD-10-CM | POA: Diagnosis not present

## 2017-07-26 DIAGNOSIS — R110 Nausea: Secondary | ICD-10-CM | POA: Diagnosis not present

## 2017-07-26 DIAGNOSIS — Z8349 Family history of other endocrine, nutritional and metabolic diseases: Secondary | ICD-10-CM

## 2017-07-26 DIAGNOSIS — E782 Mixed hyperlipidemia: Secondary | ICD-10-CM | POA: Diagnosis not present

## 2017-07-26 DIAGNOSIS — M545 Low back pain: Secondary | ICD-10-CM | POA: Diagnosis not present

## 2017-07-26 DIAGNOSIS — I420 Dilated cardiomyopathy: Secondary | ICD-10-CM | POA: Diagnosis present

## 2017-07-26 DIAGNOSIS — I252 Old myocardial infarction: Secondary | ICD-10-CM

## 2017-07-26 DIAGNOSIS — I255 Ischemic cardiomyopathy: Secondary | ICD-10-CM | POA: Diagnosis not present

## 2017-07-26 DIAGNOSIS — Z882 Allergy status to sulfonamides status: Secondary | ICD-10-CM

## 2017-07-26 DIAGNOSIS — M5136 Other intervertebral disc degeneration, lumbar region: Secondary | ICD-10-CM | POA: Diagnosis not present

## 2017-07-26 DIAGNOSIS — R0602 Shortness of breath: Secondary | ICD-10-CM | POA: Diagnosis not present

## 2017-07-26 DIAGNOSIS — Z885 Allergy status to narcotic agent status: Secondary | ICD-10-CM

## 2017-07-26 DIAGNOSIS — R079 Chest pain, unspecified: Secondary | ICD-10-CM | POA: Diagnosis not present

## 2017-07-26 DIAGNOSIS — Z9049 Acquired absence of other specified parts of digestive tract: Secondary | ICD-10-CM | POA: Diagnosis not present

## 2017-07-26 DIAGNOSIS — Z6834 Body mass index (BMI) 34.0-34.9, adult: Secondary | ICD-10-CM

## 2017-07-26 DIAGNOSIS — Z9079 Acquired absence of other genital organ(s): Secondary | ICD-10-CM

## 2017-07-26 DIAGNOSIS — Z8249 Family history of ischemic heart disease and other diseases of the circulatory system: Secondary | ICD-10-CM

## 2017-07-26 DIAGNOSIS — Z888 Allergy status to other drugs, medicaments and biological substances status: Secondary | ICD-10-CM

## 2017-07-26 DIAGNOSIS — Z7902 Long term (current) use of antithrombotics/antiplatelets: Secondary | ICD-10-CM

## 2017-07-26 DIAGNOSIS — Z87891 Personal history of nicotine dependence: Secondary | ICD-10-CM

## 2017-07-26 DIAGNOSIS — Z881 Allergy status to other antibiotic agents status: Secondary | ICD-10-CM

## 2017-07-26 DIAGNOSIS — Z823 Family history of stroke: Secondary | ICD-10-CM

## 2017-07-26 DIAGNOSIS — K219 Gastro-esophageal reflux disease without esophagitis: Secondary | ICD-10-CM | POA: Diagnosis present

## 2017-07-26 DIAGNOSIS — Z7982 Long term (current) use of aspirin: Secondary | ICD-10-CM

## 2017-07-26 DIAGNOSIS — Z955 Presence of coronary angioplasty implant and graft: Secondary | ICD-10-CM

## 2017-07-26 DIAGNOSIS — I701 Atherosclerosis of renal artery: Secondary | ICD-10-CM | POA: Diagnosis not present

## 2017-07-26 DIAGNOSIS — E1122 Type 2 diabetes mellitus with diabetic chronic kidney disease: Secondary | ICD-10-CM | POA: Diagnosis present

## 2017-07-26 DIAGNOSIS — I493 Ventricular premature depolarization: Secondary | ICD-10-CM | POA: Diagnosis present

## 2017-07-26 LAB — I-STAT TROPONIN, ED: TROPONIN I, POC: 0.01 ng/mL (ref 0.00–0.08)

## 2017-07-26 LAB — BASIC METABOLIC PANEL
ANION GAP: 10 (ref 5–15)
BUN: 43 mg/dL — AB (ref 6–20)
CALCIUM: 9.5 mg/dL (ref 8.9–10.3)
CO2: 24 mmol/L (ref 22–32)
Chloride: 106 mmol/L (ref 101–111)
Creatinine, Ser: 2.67 mg/dL — ABNORMAL HIGH (ref 0.61–1.24)
GFR calc Af Amer: 26 mL/min — ABNORMAL LOW (ref >60)
GFR, EST NON AFRICAN AMERICAN: 22 mL/min — AB (ref >60)
Glucose, Bld: 128 mg/dL — ABNORMAL HIGH (ref 65–99)
Potassium: 4.2 mmol/L (ref 3.5–5.1)
SODIUM: 140 mmol/L (ref 135–145)

## 2017-07-26 LAB — CBC
HEMATOCRIT: 41.6 % (ref 39.0–52.0)
Hemoglobin: 13.6 g/dL (ref 13.0–17.0)
MCH: 29 pg (ref 26.0–34.0)
MCHC: 32.7 g/dL (ref 30.0–36.0)
MCV: 88.7 fL (ref 78.0–100.0)
Platelets: 152 10*3/uL (ref 150–400)
RBC: 4.69 MIL/uL (ref 4.22–5.81)
RDW: 14.7 % (ref 11.5–15.5)
WBC: 7.8 10*3/uL (ref 4.0–10.5)

## 2017-07-26 NOTE — ED Notes (Signed)
Patient transported to X-ray 

## 2017-07-26 NOTE — ED Triage Notes (Signed)
Pt reports central CP present X few days. Most recent "episode" occurred tonight. Non radiating, pressure like, 4/10.  Also reports SOB X2 mo, states he is already being followed for this.

## 2017-07-27 ENCOUNTER — Other Ambulatory Visit: Payer: Self-pay

## 2017-07-27 ENCOUNTER — Inpatient Hospital Stay (HOSPITAL_COMMUNITY)
Admission: EM | Admit: 2017-07-27 | Discharge: 2017-07-29 | DRG: 287 | Disposition: A | Discharge status: Home / Self Care | Payer: Medicare Other | Attending: Cardiology | Admitting: Cardiology

## 2017-07-27 ENCOUNTER — Encounter (HOSPITAL_COMMUNITY): Payer: Self-pay

## 2017-07-27 DIAGNOSIS — N184 Chronic kidney disease, stage 4 (severe): Secondary | ICD-10-CM

## 2017-07-27 DIAGNOSIS — I420 Dilated cardiomyopathy: Secondary | ICD-10-CM | POA: Diagnosis present

## 2017-07-27 DIAGNOSIS — I25810 Atherosclerosis of coronary artery bypass graft(s) without angina pectoris: Secondary | ICD-10-CM | POA: Diagnosis present

## 2017-07-27 DIAGNOSIS — G8929 Other chronic pain: Secondary | ICD-10-CM | POA: Diagnosis present

## 2017-07-27 DIAGNOSIS — J439 Emphysema, unspecified: Secondary | ICD-10-CM | POA: Diagnosis present

## 2017-07-27 DIAGNOSIS — E785 Hyperlipidemia, unspecified: Secondary | ICD-10-CM | POA: Diagnosis present

## 2017-07-27 DIAGNOSIS — Z951 Presence of aortocoronary bypass graft: Secondary | ICD-10-CM | POA: Diagnosis not present

## 2017-07-27 DIAGNOSIS — I200 Unstable angina: Secondary | ICD-10-CM | POA: Diagnosis present

## 2017-07-27 DIAGNOSIS — I701 Atherosclerosis of renal artery: Secondary | ICD-10-CM | POA: Diagnosis present

## 2017-07-27 DIAGNOSIS — I252 Old myocardial infarction: Secondary | ICD-10-CM | POA: Diagnosis not present

## 2017-07-27 DIAGNOSIS — E782 Mixed hyperlipidemia: Secondary | ICD-10-CM | POA: Diagnosis not present

## 2017-07-27 DIAGNOSIS — M545 Low back pain: Secondary | ICD-10-CM | POA: Diagnosis present

## 2017-07-27 DIAGNOSIS — E119 Type 2 diabetes mellitus without complications: Secondary | ICD-10-CM

## 2017-07-27 DIAGNOSIS — M5136 Other intervertebral disc degeneration, lumbar region: Secondary | ICD-10-CM | POA: Diagnosis present

## 2017-07-27 DIAGNOSIS — Z9049 Acquired absence of other specified parts of digestive tract: Secondary | ICD-10-CM | POA: Diagnosis not present

## 2017-07-27 DIAGNOSIS — R0602 Shortness of breath: Secondary | ICD-10-CM | POA: Diagnosis present

## 2017-07-27 DIAGNOSIS — E1121 Type 2 diabetes mellitus with diabetic nephropathy: Secondary | ICD-10-CM | POA: Diagnosis present

## 2017-07-27 DIAGNOSIS — I130 Hypertensive heart and chronic kidney disease with heart failure and stage 1 through stage 4 chronic kidney disease, or unspecified chronic kidney disease: Secondary | ICD-10-CM | POA: Diagnosis present

## 2017-07-27 DIAGNOSIS — I714 Abdominal aortic aneurysm, without rupture: Secondary | ICD-10-CM | POA: Diagnosis present

## 2017-07-27 DIAGNOSIS — Z9079 Acquired absence of other genital organ(s): Secondary | ICD-10-CM | POA: Diagnosis not present

## 2017-07-27 DIAGNOSIS — Z955 Presence of coronary angioplasty implant and graft: Secondary | ICD-10-CM | POA: Diagnosis not present

## 2017-07-27 DIAGNOSIS — I2510 Atherosclerotic heart disease of native coronary artery without angina pectoris: Secondary | ICD-10-CM | POA: Diagnosis present

## 2017-07-27 DIAGNOSIS — E669 Obesity, unspecified: Secondary | ICD-10-CM

## 2017-07-27 DIAGNOSIS — I25110 Atherosclerotic heart disease of native coronary artery with unstable angina pectoris: Secondary | ICD-10-CM | POA: Diagnosis present

## 2017-07-27 DIAGNOSIS — I25119 Atherosclerotic heart disease of native coronary artery with unspecified angina pectoris: Secondary | ICD-10-CM | POA: Diagnosis present

## 2017-07-27 DIAGNOSIS — K219 Gastro-esophageal reflux disease without esophagitis: Secondary | ICD-10-CM | POA: Diagnosis present

## 2017-07-27 DIAGNOSIS — I255 Ischemic cardiomyopathy: Secondary | ICD-10-CM | POA: Diagnosis present

## 2017-07-27 DIAGNOSIS — I25710 Atherosclerosis of autologous vein coronary artery bypass graft(s) with unstable angina pectoris: Secondary | ICD-10-CM | POA: Diagnosis not present

## 2017-07-27 DIAGNOSIS — N189 Chronic kidney disease, unspecified: Secondary | ICD-10-CM | POA: Diagnosis not present

## 2017-07-27 DIAGNOSIS — T82858A Stenosis of vascular prosthetic devices, implants and grafts, initial encounter: Secondary | ICD-10-CM | POA: Diagnosis present

## 2017-07-27 DIAGNOSIS — I5042 Chronic combined systolic (congestive) and diastolic (congestive) heart failure: Secondary | ICD-10-CM | POA: Diagnosis present

## 2017-07-27 DIAGNOSIS — M109 Gout, unspecified: Secondary | ICD-10-CM | POA: Diagnosis present

## 2017-07-27 DIAGNOSIS — N183 Chronic kidney disease, stage 3 (moderate): Secondary | ICD-10-CM | POA: Diagnosis not present

## 2017-07-27 HISTORY — DX: Unstable angina: I20.0

## 2017-07-27 LAB — I-STAT TROPONIN, ED: Troponin i, poc: 0 ng/mL (ref 0.00–0.08)

## 2017-07-27 LAB — TROPONIN I: Troponin I: <0.03 ng/mL (ref <0.03)

## 2017-07-27 LAB — HEPARIN LEVEL (UNFRACTIONATED)
HEPARIN UNFRACTIONATED: 0.35 [IU]/mL (ref 0.30–0.70)
Heparin Unfractionated: 0.33 IU/mL (ref 0.30–0.70)

## 2017-07-27 MED ORDER — PNEUMOCOCCAL VAC POLYVALENT 25 MCG/0.5ML IJ INJ: 0.5000 mL | INJECTION | INTRAMUSCULAR | Status: DC | PRN

## 2017-07-27 MED ORDER — ATORVASTATIN CALCIUM 40 MG PO TABS: 40.0000 mg | ORAL_TABLET | Freq: Every day | ORAL | Status: DC

## 2017-07-27 MED ORDER — SODIUM CHLORIDE 0.9% FLUSH: 3.0000 mL | INTRAVENOUS | Status: DC | PRN

## 2017-07-27 MED ORDER — SODIUM CHLORIDE 0.9 % IV SOLN
INTRAVENOUS | Status: DC
  Administered 2017-07-28: 02:00:00 via INTRAVENOUS

## 2017-07-27 MED ORDER — ONDANSETRON HCL 4 MG/2ML IJ SOLN: 4.0000 mg | Freq: Four times a day (QID) | INTRAMUSCULAR | Status: DC | PRN

## 2017-07-27 MED ORDER — HEPARIN (PORCINE) IN NACL 100-0.45 UNIT/ML-% IJ SOLN
1300.0000 [IU]/h | INTRAMUSCULAR | Status: DC
  Administered 2017-07-27 (×2): 1300 [IU]/h via INTRAVENOUS
  Filled 2017-07-27 (×2): qty 250

## 2017-07-27 MED ORDER — AMLODIPINE BESYLATE 5 MG PO TABS
5.0000 mg | ORAL_TABLET | Freq: Every day | ORAL | Status: DC
  Administered 2017-07-29: 5 mg via ORAL
  Filled 2017-07-27: qty 1

## 2017-07-27 MED ORDER — SODIUM CHLORIDE 0.9 % IV SOLN: 250.0000 mL | INTRAVENOUS | Status: DC | PRN

## 2017-07-27 MED ORDER — PANTOPRAZOLE SODIUM 40 MG PO TBEC
40.0000 mg | DELAYED_RELEASE_TABLET | Freq: Every day | ORAL | Status: DC
  Administered 2017-07-29: 40 mg via ORAL
  Filled 2017-07-27: qty 1

## 2017-07-27 MED ORDER — SODIUM CHLORIDE 0.9 % IV SOLN: INTRAVENOUS | Status: DC

## 2017-07-27 MED ORDER — CARVEDILOL 25 MG PO TABS
25.0000 mg | ORAL_TABLET | Freq: Two times a day (BID) | ORAL | Status: DC
  Administered 2017-07-27 – 2017-07-29 (×3): 25 mg via ORAL
  Filled 2017-07-27 (×3): qty 1

## 2017-07-27 MED ORDER — ASPIRIN 81 MG PO CHEW
81.0000 mg | CHEWABLE_TABLET | ORAL | Status: AC
  Administered 2017-07-28: 81 mg via ORAL
  Filled 2017-07-27: qty 1

## 2017-07-27 MED ORDER — ASPIRIN 81 MG PO CHEW
324.0000 mg | CHEWABLE_TABLET | Freq: Once | ORAL | Status: AC
  Administered 2017-07-27: 324 mg via ORAL
  Filled 2017-07-27: qty 4

## 2017-07-27 MED ORDER — NITROGLYCERIN 0.4 MG SL SUBL: 0.4000 mg | SUBLINGUAL_TABLET | SUBLINGUAL | Status: DC | PRN

## 2017-07-27 MED ORDER — CLOPIDOGREL BISULFATE 75 MG PO TABS
75.0000 mg | ORAL_TABLET | Freq: Every day | ORAL | Status: DC
  Administered 2017-07-29: 75 mg via ORAL
  Filled 2017-07-27: qty 1

## 2017-07-27 MED ORDER — SODIUM CHLORIDE 0.9 % IV SOLN
INTRAVENOUS | Status: DC
  Administered 2017-07-27: 75 mL/h via INTRAVENOUS

## 2017-07-27 MED ORDER — SODIUM CHLORIDE 0.9% FLUSH: 3.0000 mL | Freq: Two times a day (BID) | INTRAVENOUS | Status: DC

## 2017-07-27 MED ORDER — SODIUM BICARBONATE 650 MG PO TABS
650.0000 mg | ORAL_TABLET | Freq: Two times a day (BID) | ORAL | Status: DC
  Administered 2017-07-27 – 2017-07-29 (×3): 650 mg via ORAL
  Filled 2017-07-27 (×3): qty 1

## 2017-07-27 MED ORDER — ASPIRIN 81 MG PO CHEW
81.0000 mg | CHEWABLE_TABLET | Freq: Every morning | ORAL | Status: DC
  Administered 2017-07-29: 81 mg via ORAL
  Filled 2017-07-27: qty 1

## 2017-07-27 MED ORDER — HEPARIN BOLUS VIA INFUSION
3000.0000 [IU] | Freq: Once | INTRAVENOUS | Status: AC
  Administered 2017-07-27: 3000 [IU] via INTRAVENOUS
  Filled 2017-07-27: qty 3000

## 2017-07-27 NOTE — ED Notes (Signed)
Attempted to call report to [facility].

## 2017-07-27 NOTE — Progress Notes (Signed)
ANTICOAGULATION CONSULT NOTE - Initial Consult  Pharmacy Consult for HEPARIN Indication: chest pain/ACS  Allergies  Allergen Reactions  . Predicort [Prednisolone] Other (See Comments)    Stomach pain  . Ciprofloxacin     GI upset  . Hydrochlorothiazide Other (See Comments)    Dehydration  . Hydrocodone     Stomach upset  . Hydrocodone-Acetaminophen     Stomach upset  . Hydrocodone-Acetaminophen Nausea Only  . Loratadine Other (See Comments)    Other reaction(s): Unknown  . Sulfa Antibiotics Other (See Comments)    Cannot recall  . Sulfacetamide Sodium Other (See Comments)    Cannot recall  . Sulfasalazine     Other reaction(s): Other (See Comments) Cannot recall  . Hydrocodone-Acetaminophen Nausea Only  . Penicillins Hives and Rash    Has patient had a PCN reaction causing immediate rash, facial/tongue/throat swelling, SOB or lightheadedness with hypotension: YES Has patient had a PCN reaction causing severe rash involving mucus membranes or skin necrosis: NO Has patient had a PCN reaction that required hospitalization NO Has patient had a PCN reaction occurring within the last 10 years:NO If all of the above answers are "NO", then may proceed with Cephalosporin use.    Patient Measurements: Height: 5\' 10"  (177.8 cm) Weight: 230 lb (104.3 kg) IBW/kg (Calculated) : 73 Heparin Dosing Weight: 95kg  Vital Signs: Temp: 98.7 F (37.1 C) (02/26 2214) Temp Source: Oral (02/26 2214) BP: 147/56 (02/27 0130) Pulse Rate: 58 (02/27 0130)  Labs: Recent Labs    07/26/17 2203  HGB 13.6  HCT 41.6  PLT 152  CREATININE 2.67*    Estimated Creatinine Clearance: 29.8 mL/min (A) (by C-G formula based on SCr of 2.67 mg/dL (H)).   Medical History: Past Medical History:  Diagnosis Date  . AAA (abdominal aortic aneurysm) ([place])    a. 3cm by [place] 2015.  [name] Arthritis    "hips; back" (12/13/2014)  . CAD (coronary artery disease) 2007   a. s/p CABG- IMA-LAD, VG-Cx, VG-RCA, VG-diag  in 1999. B. sp redo CABG- VG-OM, VG-RCA in 2007 due to VG disease. c. NSTEMI 11/2014 s/p DES to SVG-OM from the Y graft.d. PTCA/DES x 1 distal body of SVG to Diagonal.09/2015  . Chronic combined systolic and diastolic CHF (congestive heart failure) ([place])    a. remote EF 40-45% in 2006. b. Normal EF 2014. b. Echo 07/2016 EF 45-50%, grade 1 DD.  [name] Chronic lower back pain   . CKD (chronic kidney disease), stage IV ([place])   . COPD (chronic obstructive pulmonary disease) ([place])   . Deafness in left ear   . Degenerative disc disease, lumbar   . Dilated cardiomyopathy ([place]) 10/07/2015  . Emphysema   . Esophageal stricture 07/02/1998   EGD  . GERD (gastroesophageal reflux disease)   . History of gout    "last flareup was in 2007" (12/13/2014)  . History of hiatal hernia   . Hyperlipidemia   . Hypertension   . Ischemic cardiomyopathy 2006   EF 40% to 50% by 2D echo in 2006;  Echo 12/31/12: Mild LVH, EF 50-55%, normal wall motion.   [name] PVC's (premature ventricular contractions)   . Renal artery stenosis ([place])    a. noted on CT 2008.  . Type II diabetes mellitus ([place])    Diet control   . Walking pneumonia 1990's    Assessment: 72yo male w/ h/o CABG c/o CP associated w/ SOB and nausea, initial istat troponin negative, to begin heparin.  Goal of Therapy:  Heparin  level 0.3-0.7 units/ml Monitor platelets by anticoagulation protocol: Yes   Plan:  Will give heparin 3000 units IV bolus x1 followed by gtt at 1300 units/hr and monitor heparin levels and CBC.  Wynona Neat, PharmD, BCPS  07/27/2017,3:07 AM

## 2017-07-27 NOTE — H&P (Signed)
Cardiology History & Physical    Patient ID: Ronald [name] [facility] MRN: [id number], DOB: [birth year] Date of Encounter: 07/27/2017, 3:06 AM Primary Physician: [name]., MD Primary Cardiologist: Angelena Form  Chief Complaint: chest pain Reason for Admission: unstable angina Requesting MD: Rancour  HPI: Ronald [name] is a 72 y.o. male with history of CAD s/p CABG in 1996 (LIMA-LAD, SVG-circumflex, [place], SVG-diagonal) with redo bypass in 2007 (SVG-OM, SVG-RCA), subsequent PCIs (DES to SVG-OM 2016, DES to SVG-diagonal in 2017), ischemic cardiomyopathy/chronic combined CHF, CKD III-IV, DM 2, HTN, HLD, renal artery stenosis by CT 2008, AAA, COPD, GERD and PVCs who presents with chest pain.  Patient was walking in a store tonight and developed chest tightness and dyspnea.  This felt a little different than his prior anginal pain.  He came to the ED.  Walking into the ED exacerbated his pain.  Soon after arrival the pain resolved.  EKG demonstrated no acute ST segment changes.  Currently is not having any chest tightness.  3 days ago had an episode at home of diaphoresis, chest pain, and dyspnea.  He has also been very short of breath the past 2 months.  He believes his symptoms are due to blockages.  He is hesitant to have a stress test because he had a lot of chest discomfort with injection of the vasodilator previously.  Past Medical History:  Diagnosis Date  . AAA (abdominal aortic aneurysm) ([place])    a. 3cm by [place] 2015.  [name] Arthritis    "hips; back" (12/13/2014)  . CAD (coronary artery disease) 2007   a. s/p CABG- IMA-LAD, VG-Cx, VG-RCA, VG-diag in 1999. B. sp redo CABG- VG-OM, VG-RCA in 2007 due to VG disease. c. NSTEMI 11/2014 s/p DES to SVG-OM from the Y graft.d. PTCA/DES x 1 distal body of SVG to Diagonal.09/2015  . Chronic combined systolic and diastolic CHF (congestive heart failure) ([place])    a. remote EF 40-45% in 2006. b. Normal EF 2014. b. Echo 07/2016 EF 45-50%, grade 1 DD.  [name]  Chronic lower back pain   . CKD (chronic kidney disease), stage IV ([place])   . COPD (chronic obstructive pulmonary disease) ([place])   . Deafness in left ear   . Degenerative disc disease, lumbar   . Dilated cardiomyopathy ([place]) 10/07/2015  . Emphysema   . Esophageal stricture 07/02/1998   EGD  . GERD (gastroesophageal reflux disease)   . History of gout    "last flareup was in 2007" (12/13/2014)  . History of hiatal hernia   . Hyperlipidemia   . Hypertension   . Ischemic cardiomyopathy 2006   EF 40% to 50% by 2D echo in 2006;  Echo 12/31/12: Mild LVH, EF 50-55%, normal wall motion.   [name] PVC's (premature ventricular contractions)   . Renal artery stenosis ([place])    a. noted on CT 2008.  . Type II diabetes mellitus ([place])    Diet control   . Walking pneumonia 1990's     Surgical History:  Past Surgical History:  Procedure Laterality Date  . CARDIAC CATHETERIZATION  "several"  . CARDIAC CATHETERIZATION N/A 12/13/2014   Procedure: Left Heart Cath and Coronary Angiography;  Surgeon: Jettie Booze, MD;  Location: [place] [facility];  Service: Cardiovascular;  Laterality: N/A;  . CARDIAC CATHETERIZATION  12/13/2014   Procedure: Coronary Stent Intervention;  Surgeon: Jettie Booze, MD;  Location: [place] [facility];  Service: Cardiovascular;;  . CARDIAC CATHETERIZATION N/A 10/07/2015   Procedure: Left  Heart Cath and Cors/Grafts Angiography;  Surgeon: Burnell Blanks, MD;  Location: [place] [facility];  Service: Cardiovascular;  Laterality: N/A;  . CARDIAC CATHETERIZATION N/A 10/07/2015   Procedure: Coronary Stent Intervention;  Surgeon: Burnell Blanks, MD;  Location: [place] [facility];  Service: Cardiovascular;  Laterality: N/A;  . CORONARY ANGIOPLASTY  "several"  . CORONARY ANGIOPLASTY WITH STENT PLACEMENT  2005; 12/13/2014   "2; 1"  . CORONARY ARTERY BYPASS GRAFT  1996   CABG X5  . CORONARY ARTERY BYPASS GRAFT  March 2007   CABG X3  . ESOPHAGOGASTRODUODENOSCOPY (EGD) WITH  ESOPHAGEAL DILATION  2000  . GREEN LIGHT LASER TURP (TRANSURETHRAL RESECTION OF PROSTATE  2000's   "not cancerous"  . HERNIA REPAIR    . LAPAROSCOPIC CHOLECYSTECTOMY    . LUNG SURGERY  1996   "S/P CABG, had to put staple in lung after it had collapsed"  . UMBILICAL HERNIA REPAIR     w/chole     Home Meds: Prior to Admission medications   Medication Sig Start Date End Date Taking? Authorizing Provider  allopurinol (ZYLOPRIM) 100 MG tablet TAKE 1 TABLET BY MOUTH AT  BEDTIME 12/13/16  Yes [name]., MD  amLODipine (NORVASC) 5 MG tablet TAKE 1 TABLET BY MOUTH  DAILY Patient taking differently: No sig reported 08/28/16  Yes [name]., MD  aspirin 81 MG tablet Take 81 mg by mouth every morning.    Yes [provider]  atorvastatin (LIPITOR) 40 MG tablet Take 1 tablet (40 mg total) by mouth daily. 12/13/16  Yes Burnell Blanks, MD  carvedilol (COREG) 25 MG tablet Take 1 tablet (25 mg total) by mouth 2 (two) times daily. 06/09/17 09/07/17 Yes Burnell Blanks, MD  clopidogrel (PLAVIX) 75 MG tablet TAKE 1 TABLET BY MOUTH  DAILY 11/16/16  Yes Dunn, Dayna N, PA-C  furosemide (LASIX) 40 MG tablet TAKE 1 TABLET BY MOUTH EVERY MORNING TAKE AS NEEDED FOR LEG SWELLING 05/05/17  Yes [provider]  magnesium oxide (MAG-OX) 400 (241.3 Mg) MG tablet TAKE 1 TABLET BY MOUTH TWO TIMES DAILY. 01/24/17  Yes [name]., MD  Multiple Vitamin (MULTIVITAMIN) tablet Take 1 tablet by mouth every morning.    Yes [provider]  nitroGLYCERIN (NITROSTAT) 0.4 MG SL tablet Place 0.4 mg under the tongue every 5 (five) minutes as needed for chest pain (Up to 3 times).   Yes [provider]  pantoprazole (PROTONIX) 40 MG tablet Take 1 tablet (40 mg total) by mouth daily. 10/08/15  Yes Arbutus Leas, NP  ranitidine (ZANTAC) 150 MG tablet Take 150 mg by mouth at bedtime.  09/28/16  Yes [provider]  sodium bicarbonate 650 MG tablet Take  650 mg by mouth 2 (two) times daily.   Yes [provider]  nystatin cream (MYCOSTATIN) Apply 1 application topically 2 (two) times daily as needed for dry skin. Patient not taking: Reported on 07/27/2017 05/26/17   [name]., MD    Allergies:  Allergies  Allergen Reactions  . Predicort [Prednisolone] Other (See Comments)    Stomach pain  . Ciprofloxacin     GI upset  . Hydrochlorothiazide Other (See Comments)    Dehydration  . Hydrocodone     Stomach upset  . Hydrocodone-Acetaminophen     Stomach upset  . Hydrocodone-Acetaminophen Nausea Only  . Loratadine Other (See Comments)    Other reaction(s): Unknown  . Sulfa Antibiotics Other (See Comments)  Cannot recall  . Sulfacetamide Sodium Other (See Comments)    Cannot recall  . Sulfasalazine     Other reaction(s): Other (See Comments) Cannot recall  . Hydrocodone-Acetaminophen Nausea Only  . Penicillins Hives and Rash    Has patient had a PCN reaction causing immediate rash, facial/tongue/throat swelling, SOB or lightheadedness with hypotension: YES Has patient had a PCN reaction causing severe rash involving mucus membranes or skin necrosis: NO Has patient had a PCN reaction that required hospitalization NO Has patient had a PCN reaction occurring within the last 10 years:NO If all of the above answers are "NO", then may proceed with Cephalosporin use.    Social History   Socioeconomic History  . Marital status: Married    Spouse name: Not on file  . Number of children: 2  . Years of education: Not on file  . Highest education level: Not on file  Social Needs  . Financial resource strain: Not on file  . Food insecurity - worry: Not on file  . Food insecurity - inability: Not on file  . Transportation needs - medical: Not on file  . Transportation needs - non-medical: Not on file  Occupational History  . Occupation: Retired  Tobacco Use  . Smoking status: Former Smoker    Packs/day: 3.00     Years: 20.00    Pack years: 60.00    Types: Cigarettes    Last attempt to quit: 07/25/1986    Years since quitting: 31.0  . Smokeless tobacco: Never Used  Substance and Sexual Activity  . Alcohol use: No    Alcohol/week: 0.0 oz  . Drug use: No  . Sexual activity: Not Currently  Other Topics Concern  . Not on file  Social History Narrative   Did auto salvage work.     Family History  Problem Relation Age of Onset  . Heart attack Mother        MI  . Stroke Mother   . Heart disease Mother   . Hypertension Mother   . Hyperlipidemia Mother   . Heart disease Father   . Rheumatic fever Father   . Colon cancer Neg Hx     Review of Systems: General: negative for chills, fever, night sweats or weight changes.  Cardiovascular: positive for chest pain.  Negative for edema, orthopnea, palpitations, paroxysmal nocturnal dyspnea, shortness of breath or dyspnea on exertion Dermatological: negative for rash Respiratory: negative for cough or wheezing Urologic: negative for hematuria Abdominal: negative for nausea, vomiting, diarrhea, bright red blood per rectum, melena, or hematemesis Neurologic: negative for visual changes, syncope, or dizziness All other systems reviewed and are otherwise negative except as noted above.  Labs:   Lab Results  Component Value Date   WBC 7.8 07/26/2017   HGB 13.6 07/26/2017   HCT 41.6 07/26/2017   MCV 88.7 07/26/2017   PLT 152 07/26/2017    Recent Labs  Lab 07/26/17 2203  NA 140  K 4.2  CL 106  CO2 24  BUN 43*  CREATININE 2.67*  CALCIUM 9.5  GLUCOSE 128*   No results for input(s): CKTOTAL, CKMB, TROPONINI in the last 72 hours. Lab Results  Component Value Date   CHOL 138 01/13/2017   HDL 40 01/13/2017   LDLCALC 74 01/13/2017   TRIG 118 01/13/2017   Lab Results  Component Value Date   DDIMER  08/20/2008    0.29        AT THE INHOUSE ESTABLISHED CUTOFF VALUE OF 0.48 ug/mL FEU,  THIS ASSAY HAS BEEN DOCUMENTED IN THE LITERATURE  TO HAVE A SENSITIVITY AND NEGATIVE PREDICTIVE VALUE OF AT LEAST 98 TO 99%.  THE TEST RESULT SHOULD BE CORRELATED WITH AN ASSESSMENT OF THE CLINICAL PROBABILITY OF DVT / VTE.    Radiology/Studies:  Ct Abdomen Pelvis Wo Contrast  Result Date: 07/08/2017 CLINICAL DATA:  Chronic abdominal pain EXAM: CT ABDOMEN AND PELVIS WITHOUT CONTRAST TECHNIQUE: Multidetector CT imaging of the abdomen and pelvis was performed following the standard protocol without IV contrast. Oral contrast was administered. COMPARISON:  CT abdomen Oct 21, 2006 FINDINGS: Lower chest: There is patchy bibasilar atelectatic change. There is a nodular opacity in the inferior lingula measuring 9 x 9 mm, seen on axial slice 6 series 5. Hepatobiliary: No focal liver lesions are evident on this noncontrast enhanced study. Gallbladder is absent. There is no appreciable biliary duct dilatation. Pancreas: No pancreatic mass or inflammatory focus. Spleen: No splenic lesions are evident. Adrenals/Urinary Tract: Adrenals bilaterally appear normal. There is a cyst arising from the mid left kidney measuring 3.3 x 2.8 cm. There is a cyst arising from the anterior lower pole left kidney measuring 2.5 x 1.8 cm. There is a cyst arising from the lower pole left kidney measuring 2.1 x 2.0 cm. There is a cyst arising from the posterior mid right kidney measuring 3.2 x 3.2 cm. There is no evident hydronephrosis on either side. There is no renal or ureteral calculus on either side. Urinary bladder is midline with wall thickness within normal limits. Stomach/Bowel: There is no appreciable bowel wall or mesenteric thickening. No evident bowel obstruction. No appreciable free air or portal venous air. Vascular/Lymphatic: There is atherosclerotic calcification throughout the aorta and common iliac arteries. There is also internal and external iliac artery atherosclerotic calcification. There is slight dilatation of the distal abdominal aorta with a maximum transverse  diameter of 2.9 x 2.6 cm. No periaortic fluid evident. Major mesenteric vessels appear patent on this noncontrast enhanced study, although there is moderate atherosclerotic calcification in the proximal major mesenteric arterial vessels. There is moderate atherosclerotic calcification in each common femoral artery. There is no appreciable adenopathy in the abdomen or pelvis. Reproductive: There are prostatic calculi. Prostate and seminal vesicles appear normal in size and contour. No evident pelvic mass. Other: Appendix appears unremarkable. There is no ascites or abscess in the abdomen or pelvis. There is a fairly small ventral hernia containing only fat. Musculoskeletal: There is degenerative change throughout portions of the lumbar spine. There are no blastic or lytic bone lesions. There is no intramuscular or abdominal wall lesion. IMPRESSION: 1. 9 x 9 mm nodular opacity in the inferior lingula. Consider one of the following in 3 months for both low-risk and high-risk individuals: (a) repeat chest CT, (b) follow-up PET-CT, or (c) tissue sampling. This recommendation follows the consensus statement: Guidelines for Management of Incidental Pulmonary Nodules Detected on CT Images: From the Fleischner Society 2017; Radiology 2017; 284:228-243. 2.  No bowel obstruction.  No abscess.  Appendix appears normal. 3. No renal or ureteral calculi. There are prostatic calculi. No hydronephrosis on either side. 4. Multifocal aortoiliac and femoral arterial atherosclerosis as well as multiple foci of mesenteric arterial atherosclerosis. Dilatation of the distal abdominal aorta with a maximum transverse diameter of 2.9 x 2.6 cm. Ectatic abdominal aorta at risk for aneurysm development. Recommend followup by ultrasound in 5 years. This recommendation follows ACR consensus guidelines: White Paper of the ACR Incidental Findings Committee II on Vascular Findings. J Am Coll Radiol  2013; 98:921-194. 5.  Fairly small ventral hernia  containing only fat. 6.  Gallbladder absent. Aortic Atherosclerosis (ICD10-I70.0). Electronically Signed   By: Lowella Grip III M.D.   On: 07/08/2017 17:07   Dg Chest 2 View  Result Date: 07/26/2017 CLINICAL DATA:  Chest pain EXAM: CHEST  2 VIEW COMPARISON:  07/08/2017 FINDINGS: Cardiomegaly. Prior CABG. No confluent airspace opacities or effusions. Scarring in the lung bases bilaterally. IMPRESSION: Cardiomegaly.  Bibasilar scarring.  No active disease. Electronically Signed   By: Rolm Baptise M.D.   On: 07/26/2017 22:25   Dg Chest 2 View  Result Date: 07/08/2017 CLINICAL DATA:  Mid sternal chest burning over the last 2 days. EXAM: CHEST  2 VIEW COMPARISON:  07/13/2016 FINDINGS: Artifact overlies the chest. Previous median sternotomy and CABG. Chronic cardiomegaly and aortic atherosclerosis. Chronic scarring in the lower lungs. Chronic prominence of the epicardial fat pads. No sign of acute edema, infiltrate, collapse or effusion. IMPRESSION: No acute findings. Previous CABG. Cardiomegaly and aortic atherosclerosis. Pulmonary scarring. Electronically Signed   By: Nelson Chimes M.D.   On: 07/08/2017 13:42   Wt Readings from Last 3 Encounters:  07/26/17 104.3 kg (230 lb)  07/20/17 106.1 kg (234 lb)  07/07/17 107.2 kg (236 lb 6.4 oz)    EKG: sinus rhythm, frequent PVCs, no ischemic ST segment deviation  Physical Exam: Blood pressure (!) 147/56, pulse (!) 58, temperature 98.7 F (37.1 C), temperature source Oral, resp. rate 17, height 5\' 10"  (1.778 m), weight 104.3 kg (230 lb), SpO2 97 %. Body mass index is 33 kg/m. General: Well developed, well nourished, in no acute distress. Head: Normocephalic, atraumatic, sclera non-icteric, no xanthomas, nares are without discharge.  Neck: Negative for carotid bruits. JVD not elevated. Lungs: Clear bilaterally to auscultation without wheezes, rales, or rhonchi. Breathing is unlabored. Heart: RRR with S1 S2. No murmurs, rubs, or gallops  appreciated. Abdomen: Soft, non-tender, non-distended with normoactive bowel sounds. No hepatomegaly. No rebound/guarding. No obvious abdominal masses. Msk:  Strength and tone appear normal for age. Extremities: No clubbing or cyanosis. Trace edema.  Distal pedal pulses are 2+ and equal bilaterally. Neuro: Alert and oriented X 3. No focal deficit. No facial asymmetry. Moves all extremities spontaneously. Psych:  Responds to questions appropriately with a normal affect.   Most recent ECG    Assessment and Plan   1. Unstable angina Episodes of severe chest tightness within the past 3 days consistent with angina.  Currently resolved.  Will start IV heparin.  Likely needs left heart cath but is at high risk of renal failure due to his CKD.  We will keep n.p.o. and discuss catheterization in the morning. - IV heparin ACS protocol - NPO - possible LHC in the morning - normal saline at 75 ml/hr x 4 hours given CKD and likely LHC - monitor closely for volume overload  2. CAD 3. Frequent PVCs 4. Ischemic cardiomyopathy EF 40-50% 5. Hypertension Continue home aspirin, plavix, carvedilol, amlodipine.  Appears euvolemic with trace lower extremity edema.  BP well controlled.  6. CKD Cr 2.6 near baseline.  Continue home sodium bicabarb.  Will give small fluid bolus as above    Severity of Illness: The appropriate patient status for this patient is INPATIENT. Inpatient status is judged to be reasonable and necessary in order to provide the required intensity of service to ensure the patient's safety. The patient's presenting symptoms, physical exam findings, and initial radiographic and laboratory data in the context of their chronic comorbidities is  felt to place them at high risk for further clinical deterioration. Furthermore, it is not anticipated that the patient will be medically stable for discharge from the hospital within 2 midnights of admission. The following factors support the patient  status of inpatient.   " The patient's presenting symptoms include chest pain. " The worrisome physical exam findings include none. " The initial radiographic and laboratory data are worrisome because of unstable angina. " The chronic co-morbidities include CAD, CKD, HTN.   * I certify that at the point of admission it is my clinical judgment that the patient will require inpatient hospital care spanning beyond 2 midnights from the point of admission due to high intensity of service, high risk for further deterioration and high frequency of surveillance required.*    For questions or updates, please contact [place] Please consult [email or web address] for contact info under Cardiology/STEMI.  Signed, Chriss Czar, MD 07/27/2017, 3:06 AM

## 2017-07-27 NOTE — Progress Notes (Signed)
ANTICOAGULATION CONSULT NOTE - Follow Up Consult  Pharmacy Consult for Heparin Indication: chest pain/ACS  Allergies  Allergen Reactions  . Predicort [Prednisolone] Other (See Comments)    Stomach pain  . Ciprofloxacin     GI upset  . Hydrochlorothiazide Other (See Comments)    Dehydration  . Hydrocodone     Stomach upset  . Hydrocodone-Acetaminophen     Stomach upset  . Hydrocodone-Acetaminophen Nausea Only  . Loratadine Other (See Comments)    Other reaction(s): Unknown  . Sulfa Antibiotics Other (See Comments)    Cannot recall  . Sulfacetamide Sodium Other (See Comments)    Cannot recall  . Sulfasalazine     Other reaction(s): Other (See Comments) Cannot recall  . Hydrocodone-Acetaminophen Nausea Only  . Penicillins Hives and Rash    Has patient had a PCN reaction causing immediate rash, facial/tongue/throat swelling, SOB or lightheadedness with hypotension: YES Has patient had a PCN reaction causing severe rash involving mucus membranes or skin necrosis: NO Has patient had a PCN reaction that required hospitalization NO Has patient had a PCN reaction occurring within the last 10 years:NO If all of the above answers are "NO", then may proceed with Cephalosporin use.    Patient Measurements: Height: 5\' 10"  (177.8 cm) Weight: 228 lb 14.4 oz (103.8 kg) IBW/kg (Calculated) : 73 Heparin Dosing Weight:  95 kg  Vital Signs: Temp: 98.1 F (36.7 C) (02/27 1600) Temp Source: Oral (02/27 1600) BP: 142/61 (02/27 1600) Pulse Rate: 64 (02/27 1600)  Labs: Recent Labs    07/26/17 2203 07/27/17 0337 07/27/17 1104 07/27/17 1634  HGB 13.6  --   --   --   HCT 41.6  --   --   --   PLT 152  --   --   --   HEPARINUNFRC  --   --  0.33 0.35  CREATININE 2.67*  --   --   --   TROPONINI  --  <0.03 <0.03  --     Estimated Creatinine Clearance: 29.7 mL/min (A) (by C-G formula based on SCr of 2.67 mg/dL (H)).   Assessment: CC/HPI:  72yo male w/ h/o CABG c/o CP associated w/  SOB and nausea  Anticoag: heparin for ACS. Baseline CBC WNL. Troponins negative. Confirmatory heparin level 0.35, remains at goal on 1300 units/hr   Goal of Therapy:  Heparin level 0.3-0.7 units/ml Monitor platelets by anticoagulation protocol: Yes   Plan:  Continue IV heparin 1300 units/hr Daily HL and CBC   Maryanna Shape, PharmD, BCPS  Clinical Pharmacist  Pager: [phone]  07/27/2017,5:44 PM

## 2017-07-27 NOTE — H&P (View-Only) (Signed)
Progress Note  Patient Name: Ronald [name] [facility] Date of Encounter: 07/27/2017  Primary Cardiologist: Lauree Chandler, MD   Subjective   Currently CP free. No dyspnea at this time. Pt recalls having an episode of SSCP 2 night ago that woke him from his sleep. Felt like his chest was caving in. He was also diaphoretic. He got out of bed and sat up in a chair. CP resolved spontaneously w/o intervention in 2-3 min. Yesterday, he had recurrent exertional SSCP and exertional dyspnea, while shopping. He had to stop to rest and had his wife bring him to the ED.   Pt voiced that he really want's to pursue cath to get to the bottom line and to r/o obstructive CAD because he is fearful of having a MI. He understands the risk of worsening renal function with cath which includes possible need for HD down the road.   Inpatient Medications    Scheduled Meds: . amLODipine  5 mg Oral Daily  . aspirin  81 mg Oral q morning - 10a  . atorvastatin  40 mg Oral Daily  . carvedilol  25 mg Oral BID  . clopidogrel  75 mg Oral Daily  . pantoprazole  40 mg Oral Daily  . sodium bicarbonate  650 mg Oral BID   Continuous Infusions: . heparin 1,300 Units/hr (07/27/17 0408)   PRN Meds: nitroGLYCERIN, ondansetron (ZOFRAN) IV   Vital Signs    Vitals:   07/27/17 0300 07/27/17 0330 07/27/17 0400 07/27/17 0600  BP: (!) 147/62 (!) 148/69 135/66 136/72  Pulse: 68 (!) 43 61 63  Resp: (!) 21 14 (!) 21 14  Temp:      TempSrc:      SpO2: 97% 94% 94% 93%  Weight:      Height:       No intake or output data in the 24 hours ending 07/27/17 0801 Filed Weights   07/26/17 2215  Weight: 230 lb (104.3 kg)    Telemetry    NSR with frequent PVCs - Personally Reviewed  ECG    SR with frequent PVCs - Personally Reviewed  Physical Exam   GEN: No acute distress.   Neck: No JVD Cardiac: RR w/ frequent PVCs, no murmurs, rubs, or gallops.  Respiratory: Clear to auscultation bilaterally. GI: Soft, nontender,  non-distended  MS: No edema; No deformity. Neuro:  Nonfocal  Psych: Normal affect   Labs    Chemistry Recent Labs  Lab 07/26/17 2203  NA 140  K 4.2  CL 106  CO2 24  GLUCOSE 128*  BUN 43*  CREATININE 2.67*  CALCIUM 9.5  GFRNONAA 22*  GFRAA 26*  ANIONGAP 10     Hematology Recent Labs  Lab 07/26/17 2203  WBC 7.8  RBC 4.69  HGB 13.6  HCT 41.6  MCV 88.7  MCH 29.0  MCHC 32.7  RDW 14.7  PLT 152    Cardiac Enzymes Recent Labs  Lab 07/27/17 0337  TROPONINI <0.03    Recent Labs  Lab 07/26/17 2221 07/27/17 0208  TROPIPOC 0.01 0.00     BNPNo results for input(s): BNP, PROBNP in the last 168 hours.   DDimer No results for input(s): DDIMER in the last 168 hours.   Radiology    Dg Chest 2 View  Result Date: 07/26/2017 CLINICAL DATA:  Chest pain EXAM: CHEST  2 VIEW COMPARISON:  07/08/2017 FINDINGS: Cardiomegaly. Prior CABG. No confluent airspace opacities or effusions. Scarring in the lung bases bilaterally. IMPRESSION: Cardiomegaly.  Bibasilar scarring.  No active disease. Electronically Signed   By: Rolm Baptise M.D.   On: 07/26/2017 22:25    Cardiac Studies   LHC 10/07/2015 Conclusion    Ost Cx to Prox Cx lesion, 100% stenosed.  SVG is moderate in size.  There is mild diffuse disease in the graft.  Mid RCA lesion, 100% stenosed.  Origin lesion, 25% stenosed. The lesion was previously treated with a stent (unknown type).  SVG .  Origin to Prox Graft lesion, 40% stenosed.  was injected is normal in caliber.  Mid LAD lesion, 100% stenosed.  SVG .  There is mild diffuse disease in the graft.  SVG is large.  Prox Graft to Mid Graft lesion, 30% stenosed.  Prox Graft to Mid Graft lesion, 40% stenosed.  Origin lesion, 50% stenosed.  Prox Graft to Mid Graft lesion, 40% stenosed.  Dist Graft to Insertion lesion, 99% stenosed. Post intervention, there is a 0% residual stenosis.   1. 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    2. LAD occluded proximally. The mid and distal vessel fills from the patent IMA graft. The Diagonal fills from the patent SVG (there is a severe stenosis at this anastomosis).  3. Circumflex is occluded proximally. The vein graft to the OM is patent.  4. RCA is occluded proximally. The vein grafts to the distal RCA branches (PDA and PLA) are patent with diffuse moderate disease in the bodies of the vein grafts.  5. Successful PTCA/DES x 1 distal body of SVG to Diagonal.      2D Echo 07/14/17 Study Conclusions  - Left ventricle: The cavity size was mildly dilated. Systolic function was normal. The estimated ejection fraction was in the range of 55% to 60%. Doppler parameters are consistent with abnormal left ventricular relaxation (grade 1 diastolic dysfunction).  Vas [place] AAA 07/11/17 Abdominal Aorta: There is evidence of abnormal dilitation of the Mid Abdominal aorta. The largest aortic measurement is 2.8 cm. The largest aortic diameter remains essentially unchanged compared to prior exam. Previous diameter measurement was 3.0 cm obtained on 10/15.   Patient Profile     Ronald [name] is a 72 y.o. male, followed by Dr. Angelena Form, who has known CAD s/p CABG in 1996 (LIMA-LAD, SVG-circumflex, SVG-RCA, SVG-diagonal) with redo bypass in 2007 (SVG-OM, SVG-RCA), subsequent PCIs (DES to SVG-OM 2016, DES to SVG-diagonal in 2017), ischemic cardiomyopathy/chronic combined CHF, CKD III-IV, DM 2, HTN, HLD, renal artery stenosis by CT 2008, AAA, COPD, GERD and PVCs. He has had a h/o flucutating LV dysfunction dating back to echo in 2006 when EF was 40-45%. 2D echo in 07/2017 showed normal LV systolic function. He also has notable truncal obesity and is very sedentary, 2/2 chronic LBP. Pt presented to hospital overnight with complaint of SSCP concerning for unstable angina.   Assessment & Plan    1. Unstable Angina: currently CP free. POC troponin negative x 2. Troponin I negative x 1. EKG shows  SR w/ frequent PVCs. Continue IV heparin. Plan for possible cath. MD to weigh in further given baseline renal function. His EF was normal by recent echo, thus I will further increase rate of IVFs for hydration.   2. CAD: CAD s/p CABG in 1996 (LIMA-LAD, SVG-circumflex, SVG-RCA, SVG-diagonal) with redo bypass in 2007 (SVG-OM, SVG-RCA), subsequent PCIs (DES to SVG-OM 2016, DES to SVG-diagonal in 2017). Pt with recent SSCP and exertional dyspnea, concerning for unstable angina. He is currently pain free. Plan for possible cath. MD to weigh  in further given baseline renal function. Continue on IV heparin for now. Continue ASA, Plavix, BB and statin.   3. Frequent PVCs: pt recently evaluated in clinic for this with plans to get 48 hr holter to assess PVC burden. Continue Coreg for now. K is WNL. ? If ischemically mediated. Ischemic w/u pending.   4. HTN: Elevated this morning. Pt is NPO but will allow pt to take morning meds with small sips of water. Amlodipine 5 mg and Coreg 25 mg. Lasix is on hold given possible cath. Monitor BP closely throughout the day.  If difficulties controlling pressure, can use PRN IV hydralazine.   5. H/o Ischemic CM/ Chronic Diastolic HF: EF previously 40-45% in 2006. Recent echo showed normal LVEF and G1DD. Continue medical therapy. No ACE/ARB given CKD. Volume overall appears stable. Home lasix is on hold given plans for possible cath and CKD. Pt is getting IVFs for hydration. Monitor fluid status closely. I will order strict I/Os. Will also check daily weights.   6. AAA: stable by recent US 07/11/17.  The largest aortic measurement is 2.8 cm. The largest aortic diameter remains essentially unchanged compared to prior exam. Previous diameter measurement was 3.0 cm obtained on 02/2014. Continue to follow yearly. Keep BP controlled. Continue on BB. Pt denies tobacco use.   7. CKD: Stage III-IV. SCr on admit 2.67, which is slightly higher than his usual baseline. Since 2017, he has  had SCr in the 2.0-2.5 range. Intention was for [facility] today given CP, however given baseline renal function we will delay until MD can assess. I will increase fluids for hydration. Will follow SCr to see if any improvement throughout the day. Lasix is on hold. Pt voiced that he really want's to pursue cardiac cath to get to the bottom line and to r/o obstructive CAD because he is fearful of having a MI. He understands the risk of worsening renal function with cath which includes possible need for HD down the road.   For questions or updates, please contact [place] Please consult [email or web address] for contact info under Cardiology/STEMI.      Signed, Lyda Jester, PA-C  07/27/2017, 8:01 AM

## 2017-07-27 NOTE — ED Provider Notes (Signed)
[facility] EMERGENCY DEPARTMENT Provider Note   CSN: [id number] Arrival date & time: 07/26/17  2158     History   Chief Complaint Chief Complaint  Patient presents with  . Chest Pain    HPI Ronald [name] is a 72 y.o. male.  Patient with history of CAD status post CABG and multiple stents presenting with episode of chest pain today.  States she was walking in the store when he developed pain in the center of his chest like his "chest caving in".  He said this lasted for several minutes before resolving but when he got to his car.  It was associated with shortness of breath and nausea as well as diffuse abdominal pain.  Patient states he has had diffuse abdominal pain with his previous episodes of angina.  He did not take any nitroglycerin but the pain seemed to resolve on its own.  He denies any diaphoresis or lightheadedness.  He was seen in the ED 2 weeks ago for abdominal pain and had a reassuring workup.  He saw his cardiologist last week and is being scheduled for another stress test.  He states he is chest pain-free at this time.   The history is provided by the patient.  Chest Pain   Associated symptoms include abdominal pain, nausea, shortness of breath and weakness. Pertinent negatives include no dizziness, no fever, no headaches and no vomiting.    Past Medical History:  Diagnosis Date  . AAA (abdominal aortic aneurysm) ([place])    a. 3cm by [place] 2015.  [name] Arthritis    "hips; back" (12/13/2014)  . CAD (coronary artery disease) 2007   a. s/p CABG- IMA-LAD, VG-Cx, VG-RCA, VG-diag in 1999. B. sp redo CABG- VG-OM, VG-RCA in 2007 due to VG disease. c. NSTEMI 11/2014 s/p DES to SVG-OM from the Y graft.d. PTCA/DES x 1 distal body of SVG to Diagonal.09/2015  . Chronic combined systolic and diastolic CHF (congestive heart failure) ([place])    a. remote EF 40-45% in 2006. b. Normal EF 2014. b. Echo 07/2016 EF 45-50%, grade 1 DD.  [name] Chronic lower back pain   . CKD (chronic  kidney disease), stage IV ([place])   . COPD (chronic obstructive pulmonary disease) ([place])   . Deafness in left ear   . Degenerative disc disease, lumbar   . Dilated cardiomyopathy ([place]) 10/07/2015  . Emphysema   . Esophageal stricture 07/02/1998   EGD  . GERD (gastroesophageal reflux disease)   . History of gout    "last flareup was in 2007" (12/13/2014)  . History of hiatal hernia   . Hyperlipidemia   . Hypertension   . Ischemic cardiomyopathy 2006   EF 40% to 50% by 2D echo in 2006;  Echo 12/31/12: Mild LVH, EF 50-55%, normal wall motion.   [name] PVC's (premature ventricular contractions)   . Renal artery stenosis ([place])    a. noted on CT 2008.  . Type II diabetes mellitus ([place])    Diet control   . Walking pneumonia 1990's    Patient Active Problem List   Diagnosis Date Noted  . Atherosclerosis of native arteries of extremity with intermittent claudication ([place]) 06/25/2016  . Acute renal failure superimposed on stage 3 chronic kidney disease ([place]) 10/07/2015  . Dilated cardiomyopathy ([place]) 10/07/2015  . Coronary artery disease involving native coronary artery of native heart with unstable angina pectoris ([place])   . Allergic rhinitis 08/19/2015  . NSTEMI (non-ST elevated myocardial infarction) ([place]) 12/13/2014  . AA (  aortic aneurysm) ([place]) 10/04/2014  . Benign enlargement of prostate 10/04/2014  . Arteriosclerosis of coronary artery 10/04/2014  . Diabetes mellitus, type 2 ([place]) 10/04/2014  . Acid reflux 10/04/2014  . 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 HLD (hyperlipidemia) 10/04/2014  . BP (high blood pressure) 10/04/2014  . Osteoarthrosis 10/04/2014  . Adult BMI 30+ 10/04/2014  . 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 Urge incontinence of urine 10/24/2012  . 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 CKD (chronic kidney disease) stage 3, GFR 30-59 ml/min ([facility])   . Abdominal pain 11/09/2010  . Shortness of breath  11/12/2009  . Renal artery stenosis    . Mixed hyperlipidemia 09/04/2008  . History of redo bypass grafting 09/04/2008  . Diabetes mellitus with nephropathy [facility])     Past Surgical History:  Procedure Laterality Date  . CARDIAC CATHETERIZATION  "several"  . CARDIAC CATHETERIZATION N/A 12/13/2014   Procedure: Left Heart Cath and Coronary Angiography;  Surgeon: Jettie Booze, MD;  Location: [place] [facility];  Service: Cardiovascular;  Laterality: N/A;  . CARDIAC CATHETERIZATION  12/13/2014   Procedure: Coronary Stent Intervention;  Surgeon: Jettie Booze, MD;  Location: [place] [facility];  Service: Cardiovascular;;  . CARDIAC CATHETERIZATION N/A 10/07/2015   Procedure: Left Heart Cath and Cors/Grafts Angiography;  Surgeon: Burnell Blanks, MD;  Location: [place] [facility];  Service: Cardiovascular;  Laterality: N/A;  . CARDIAC CATHETERIZATION N/A 10/07/2015   Procedure: Coronary Stent Intervention;  Surgeon: Burnell Blanks, MD;  Location: [place] [facility];  Service: Cardiovascular;  Laterality: N/A;  . CORONARY ANGIOPLASTY  "several"  . CORONARY ANGIOPLASTY WITH STENT PLACEMENT  2005; 12/13/2014   "2; 1"  . CORONARY ARTERY BYPASS GRAFT  1996   CABG X5  . CORONARY ARTERY BYPASS GRAFT  March 2007   CABG X3  . ESOPHAGOGASTRODUODENOSCOPY (EGD) WITH ESOPHAGEAL DILATION  2000  . GREEN LIGHT LASER TURP (TRANSURETHRAL RESECTION OF PROSTATE  2000's   "not cancerous"  . HERNIA REPAIR    . LAPAROSCOPIC CHOLECYSTECTOMY    . LUNG SURGERY  1996   "S/P CABG, had to put staple in lung after it had collapsed"  . UMBILICAL HERNIA REPAIR     w/chole       Home Medications    Prior to Admission medications   Medication Sig Start Date End Date Taking? Authorizing Provider  acetaminophen (TYLENOL) 650 MG CR tablet Take 1,300 mg by mouth daily as needed for pain.    [provider]  allopurinol (ZYLOPRIM) 100 MG tablet TAKE 1 TABLET BY MOUTH AT  BEDTIME 12/13/16    [name]., MD  amLODipine (NORVASC) 5 MG tablet TAKE 1 TABLET BY MOUTH  DAILY Patient taking differently: No sig reported 08/28/16   [name]., MD  aspirin 81 MG tablet Take 81 mg by mouth every morning.     [provider]  atorvastatin (LIPITOR) 40 MG tablet Take 1 tablet (40 mg total) by mouth daily. 12/13/16   Burnell Blanks, MD  carvedilol (COREG) 25 MG tablet Take 1 tablet (25 mg total) by mouth 2 (two) times daily. 06/09/17 09/07/17  Burnell Blanks, MD  clopidogrel (PLAVIX) 75 MG tablet TAKE 1 TABLET BY MOUTH  DAILY 11/16/16   Dunn, Nedra Hai, PA-C  fluticasone (FLONASE) 50 MCG/ACT nasal spray Place 1 spray into both nostrils as needed for allergies or rhinitis.    [provider]  furosemide (LASIX) 40 MG tablet TAKE 1 TABLET BY MOUTH EVERY MORNING TAKE AS NEEDED FOR LEG SWELLING 05/05/17   [provider]  loratadine (CLARITIN) 10 MG tablet Take 10 mg by mouth daily as needed for allergies.    [provider]  magnesium oxide (MAG-OX) 400 (241.3 Mg) MG tablet TAKE 1 TABLET BY MOUTH TWO TIMES DAILY. 01/24/17   [name]., MD  Multiple Vitamin (MULTIVITAMIN) tablet Take 1 tablet by mouth every morning.     [provider]  nitroGLYCERIN (NITROSTAT) 0.4 MG SL tablet Place 0.4 mg under the tongue every 5 (five) minutes as needed for chest pain (Up to 3 times).    [provider]  nystatin cream (MYCOSTATIN) Apply 1 application topically 2 (two) times daily as needed for dry skin. 05/26/17   [name]., MD  pantoprazole (PROTONIX) 40 MG tablet Take 1 tablet (40 mg total) by mouth daily. 10/08/15   Arbutus Leas, NP  ranitidine (ZANTAC) 150 MG tablet Take 1 tablet by mouth at bedtime. 09/28/16   [provider]  sodium bicarbonate 650 MG tablet Take 650 mg by mouth 2 (two) times daily.    [provider]    Family History Family History  Problem Relation Age of Onset   . Heart attack Mother        MI  . Stroke Mother   . Heart disease Mother   . Hypertension Mother   . Hyperlipidemia Mother   . Heart disease Father   . Rheumatic fever Father   . Colon cancer Neg Hx     Social History Social History   Tobacco Use  . Smoking status: Former Smoker    Packs/day: 3.00    Years: 20.00    Pack years: 60.00    Types: Cigarettes    Last attempt to quit: 07/25/1986    Years since quitting: 31.0  . Smokeless tobacco: Never Used  Substance Use Topics  . Alcohol use: No    Alcohol/week: 0.0 oz  . Drug use: No     Allergies   Predicort [prednisolone]; Ciprofloxacin; Hydrochlorothiazide; Hydrocodone; Hydrocodone-acetaminophen; Hydrocodone-acetaminophen; Loratadine; Sulfa antibiotics; Sulfacetamide sodium; Sulfasalazine; Hydrocodone-acetaminophen; and Penicillins   Review of Systems Review of Systems  Constitutional: Positive for activity change and appetite change. Negative for fatigue and fever.  HENT: Negative for congestion.   Respiratory: Positive for chest tightness and shortness of breath.   Cardiovascular: Positive for chest pain.  Gastrointestinal: Positive for abdominal pain and nausea. Negative for vomiting.  Genitourinary: Negative for dysuria, hematuria, testicular pain and urgency.  Musculoskeletal: Negative for arthralgias and myalgias.  Skin: Negative for rash.  Neurological: Positive for weakness. Negative for dizziness and headaches.    all other systems are negative except as noted in the HPI and PMH.    Physical Exam Updated Vital Signs BP (!) 149/76 (BP Location: Right Arm)   Pulse 63   Temp 98.7 F (37.1 C) (Oral)   Resp 16   Ht 5\' 10"  (1.778 m)   Wt 104.3 kg (230 lb)   SpO2 98%   BMI 33.00 kg/m   Physical Exam  Constitutional: He is oriented to person, place, and time. He appears well-developed and well-nourished. No distress.  HENT:  Head: Normocephalic and atraumatic.  Mouth/Throat: Oropharynx is clear and  moist. No oropharyngeal exudate.  Eyes: Conjunctivae and EOM are normal. Pupils are equal, round, and reactive to light.  Neck: Normal range of motion. Neck  supple.  No meningismus.  Cardiovascular: Normal rate, regular rhythm, normal heart sounds and intact distal pulses.  No murmur heard. Pulmonary/Chest: Effort normal and breath sounds normal. No respiratory distress. He exhibits tenderness.  Pain worse with palpation.  Abdominal: Soft. He exhibits distension. There is tenderness. There is no rebound and no guarding.  Mildly distended with diffuse tenderness  Musculoskeletal: Normal range of motion. He exhibits no edema or tenderness.  Neurological: He is alert and oriented to person, place, and time. No cranial nerve deficit. He exhibits normal muscle tone. Coordination normal.  No ataxia on finger to nose bilaterally. No pronator drift. 5/5 strength throughout. CN 2-12 intact.Equal grip strength. Sensation intact.   Skin: Skin is warm.  Psychiatric: He has a normal mood and affect. His behavior is normal.  Nursing note and vitals reviewed.    ED Treatments / Results  Labs (all labs ordered are listed, but only abnormal results are displayed) Labs Reviewed  BASIC METABOLIC PANEL - Abnormal; Notable for the following components:      Result Value   Glucose, Bld 128 (*)    BUN 43 (*)    Creatinine, Ser 2.67 (*)    GFR calc non Af Amer 22 (*)    GFR calc Af Amer 26 (*)    All other components within normal limits  CBC  I-STAT TROPONIN, ED  I-STAT TROPONIN, ED    EKG  EKG Interpretation  Date/Time:  Tuesday July 26 2017 22:05:06 EST Ventricular Rate:  70 PR Interval:  178 QRS Duration: 92 QT Interval:  396 QTC Calculation: 427 R Axis:   59 Text Interpretation:  Sinus rhythm with frequent Premature ventricular complexes Otherwise normal ECG No significant change was found Confirmed by Ezequiel Essex [phone]) on 07/27/2017 12:46:29 AM       Radiology Dg Chest 2  View  Result Date: 07/26/2017 CLINICAL DATA:  Chest pain EXAM: CHEST  2 VIEW COMPARISON:  07/08/2017 FINDINGS: Cardiomegaly. Prior CABG. No confluent airspace opacities or effusions. Scarring in the lung bases bilaterally. IMPRESSION: Cardiomegaly.  Bibasilar scarring.  No active disease. Electronically Signed   By: Rolm Baptise M.D.   On: 07/26/2017 22:25    Procedures Procedures (including critical care time)  Medications Ordered in ED Medications  aspirin chewable tablet 324 mg (not administered)  nitroGLYCERIN (NITROSTAT) SL tablet 0.4 mg (not administered)     Initial Impression / Assessment and Plan / ED Course  I have reviewed the triage vital signs and the nursing notes.  Pertinent labs & imaging results that were available during my care of the patient were reviewed by me and considered in my medical decision making (see chart for details).    Patient with episode of chest pain concerning for angina.  Initial EKG without ST changes.  ASA and NTG given. CXR negative.  CP resolved in the ED.  Concerning story for angina, known CAD with multiple risk factors. Troponin negative. Low suspicion for PE or aortic dissection.  D/w cardiology fellow Dr. Charissa Bash who will admit. He will start IV heparin. Patient remains chest pain free.   CRITICAL CARE Performed by: Ezequiel Essex Total critical care time: 31 minutes Critical care time was exclusive of separately billable procedures and treating other patients. Critical care was necessary to treat or prevent imminent or life-threatening deterioration. Critical care was time spent personally by me on the following activities: development of treatment plan with patient and/or surrogate as well as nursing, discussions with consultants, evaluation of patient's response  to treatment, examination of patient, obtaining history from patient or surrogate, ordering and performing treatments and interventions, ordering and review of  laboratory studies, ordering and review of radiographic studies, pulse oximetry and re-evaluation of patient's condition.  Final Clinical Impressions(s) / ED Diagnoses   Final diagnoses:  None    ED Discharge Orders    None       Farha Dano, Annie Main, MD 07/27/17 [phone]

## 2017-07-27 NOTE — Progress Notes (Signed)
Progress Note  Patient Name: Ronald [name] [facility] Date of Encounter: 07/27/2017  Primary Cardiologist: Lauree Chandler, MD   Subjective   Currently CP free. No dyspnea at this time. Pt recalls having an episode of SSCP 2 night ago that woke him from his sleep. Felt like his chest was caving in. He was also diaphoretic. He got out of bed and sat up in a chair. CP resolved spontaneously w/o intervention in 2-3 min. Yesterday, he had recurrent exertional SSCP and exertional dyspnea, while shopping. He had to stop to rest and had his wife bring him to the ED.   Pt voiced that he really want's to pursue cath to get to the bottom line and to r/o obstructive CAD because he is fearful of having a MI. He understands the risk of worsening renal function with cath which includes possible need for HD down the road.   Inpatient Medications    Scheduled Meds: . amLODipine  5 mg Oral Daily  . aspirin  81 mg Oral q morning - 10a  . atorvastatin  40 mg Oral Daily  . carvedilol  25 mg Oral BID  . clopidogrel  75 mg Oral Daily  . pantoprazole  40 mg Oral Daily  . sodium bicarbonate  650 mg Oral BID   Continuous Infusions: . heparin 1,300 Units/hr (07/27/17 0408)   PRN Meds: nitroGLYCERIN, ondansetron (ZOFRAN) IV   Vital Signs    Vitals:   07/27/17 0300 07/27/17 0330 07/27/17 0400 07/27/17 0600  BP: (!) 147/62 (!) 148/69 135/66 136/72  Pulse: 68 (!) 43 61 63  Resp: (!) 21 14 (!) 21 14  Temp:      TempSrc:      SpO2: 97% 94% 94% 93%  Weight:      Height:       No intake or output data in the 24 hours ending 07/27/17 0801 Filed Weights   07/26/17 2215  Weight: 230 lb (104.3 kg)    Telemetry    NSR with frequent PVCs - Personally Reviewed  ECG    SR with frequent PVCs - Personally Reviewed  Physical Exam   GEN: No acute distress.   Neck: No JVD Cardiac: RR w/ frequent PVCs, no murmurs, rubs, or gallops.  Respiratory: Clear to auscultation bilaterally. GI: Soft, nontender,  non-distended  MS: No edema; No deformity. Neuro:  Nonfocal  Psych: Normal affect   Labs    Chemistry Recent Labs  Lab 07/26/17 2203  NA 140  K 4.2  CL 106  CO2 24  GLUCOSE 128*  BUN 43*  CREATININE 2.67*  CALCIUM 9.5  GFRNONAA 22*  GFRAA 26*  ANIONGAP 10     Hematology Recent Labs  Lab 07/26/17 2203  WBC 7.8  RBC 4.69  HGB 13.6  HCT 41.6  MCV 88.7  MCH 29.0  MCHC 32.7  RDW 14.7  PLT 152    Cardiac Enzymes Recent Labs  Lab 07/27/17 0337  TROPONINI <0.03    Recent Labs  Lab 07/26/17 2221 07/27/17 0208  TROPIPOC 0.01 0.00     BNPNo results for input(s): BNP, PROBNP in the last 168 hours.   DDimer No results for input(s): DDIMER in the last 168 hours.   Radiology    Dg Chest 2 View  Result Date: 07/26/2017 CLINICAL DATA:  Chest pain EXAM: CHEST  2 VIEW COMPARISON:  07/08/2017 FINDINGS: Cardiomegaly. Prior CABG. No confluent airspace opacities or effusions. Scarring in the lung bases bilaterally. IMPRESSION: Cardiomegaly.  Bibasilar scarring.  No active disease. Electronically Signed   By: Rolm Baptise M.D.   On: 07/26/2017 22:25    Cardiac Studies   LHC 10/07/2015 Conclusion    Ost Cx to Prox Cx lesion, 100% stenosed.  SVG is moderate in size.  There is mild diffuse disease in the graft.  Mid RCA lesion, 100% stenosed.  Origin lesion, 25% stenosed. The lesion was previously treated with a stent (unknown type).  SVG .  Origin to Prox Graft lesion, 40% stenosed.  was injected is normal in caliber.  Mid LAD lesion, 100% stenosed.  SVG .  There is mild diffuse disease in the graft.  SVG is large.  Prox Graft to Mid Graft lesion, 30% stenosed.  Prox Graft to Mid Graft lesion, 40% stenosed.  Origin lesion, 50% stenosed.  Prox Graft to Mid Graft lesion, 40% stenosed.  Dist Graft to Insertion lesion, 99% stenosed. Post intervention, there is a 0% residual stenosis.   1. 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    2. LAD occluded proximally. The mid and distal vessel fills from the patent IMA graft. The Diagonal fills from the patent SVG (there is a severe stenosis at this anastomosis).  3. Circumflex is occluded proximally. The vein graft to the OM is patent.  4. RCA is occluded proximally. The vein grafts to the distal RCA branches (PDA and PLA) are patent with diffuse moderate disease in the bodies of the vein grafts.  5. Successful PTCA/DES x 1 distal body of SVG to Diagonal.      2D Echo 07/14/17 Study Conclusions  - Left ventricle: The cavity size was mildly dilated. Systolic function was normal. The estimated ejection fraction was in the range of 55% to 60%. Doppler parameters are consistent with abnormal left ventricular relaxation (grade 1 diastolic dysfunction).  Vas [place] AAA 07/11/17 Abdominal Aorta: There is evidence of abnormal dilitation of the Mid Abdominal aorta. The largest aortic measurement is 2.8 cm. The largest aortic diameter remains essentially unchanged compared to prior exam. Previous diameter measurement was 3.0 cm obtained on 10/15.   Patient Profile     Ronald [name] is a 72 y.o. male, followed by Dr. Angelena Form, who has known CAD s/p CABG in 1996 (LIMA-LAD, SVG-circumflex, SVG-RCA, SVG-diagonal) with redo bypass in 2007 (SVG-OM, SVG-RCA), subsequent PCIs (DES to SVG-OM 2016, DES to SVG-diagonal in 2017), ischemic cardiomyopathy/chronic combined CHF, CKD III-IV, DM 2, HTN, HLD, renal artery stenosis by CT 2008, AAA, COPD, GERD and PVCs. He has had a h/o flucutating LV dysfunction dating back to echo in 2006 when EF was 40-45%. 2D echo in 07/2017 showed normal LV systolic function. He also has notable truncal obesity and is very sedentary, 2/2 chronic LBP. Pt presented to hospital overnight with complaint of SSCP concerning for unstable angina.   Assessment & Plan    1. Unstable Angina: currently CP free. POC troponin negative x 2. Troponin I negative x 1. EKG shows  SR w/ frequent PVCs. Continue IV heparin. Plan for possible cath. MD to weigh in further given baseline renal function. His EF was normal by recent echo, thus I will further increase rate of IVFs for hydration.   2. CAD: CAD s/p CABG in 1996 (LIMA-LAD, SVG-circumflex, SVG-RCA, SVG-diagonal) with redo bypass in 2007 (SVG-OM, SVG-RCA), subsequent PCIs (DES to SVG-OM 2016, DES to SVG-diagonal in 2017). Pt with recent SSCP and exertional dyspnea, concerning for unstable angina. He is currently pain free. Plan for possible cath. MD to weigh  in further given baseline renal function. Continue on IV heparin for now. Continue ASA, Plavix, BB and statin.   3. Frequent PVCs: pt recently evaluated in clinic for this with plans to get 48 hr holter to assess PVC burden. Continue Coreg for now. K is WNL. ? If ischemically mediated. Ischemic w/u pending.   4. HTN: Elevated this morning. Pt is NPO but will allow pt to take morning meds with small sips of water. Amlodipine 5 mg and Coreg 25 mg. Lasix is on hold given possible cath. Monitor BP closely throughout the day.  If difficulties controlling pressure, can use PRN IV hydralazine.   5. H/o Ischemic CM/ Chronic Diastolic HF: EF previously 40-45% in 2006. Recent echo showed normal LVEF and G1DD. Continue medical therapy. No ACE/ARB given CKD. Volume overall appears stable. Home lasix is on hold given plans for possible cath and CKD. Pt is getting IVFs for hydration. Monitor fluid status closely. I will order strict I/Os. Will also check daily weights.   6. AAA: stable by recent US 07/11/17.  The largest aortic measurement is 2.8 cm. The largest aortic diameter remains essentially unchanged compared to prior exam. Previous diameter measurement was 3.0 cm obtained on 02/2014. Continue to follow yearly. Keep BP controlled. Continue on BB. Pt denies tobacco use.   7. CKD: Stage III-IV. SCr on admit 2.67, which is slightly higher than his usual baseline. Since 2017, he has  had SCr in the 2.0-2.5 range. Intention was for [facility] today given CP, however given baseline renal function we will delay until MD can assess. I will increase fluids for hydration. Will follow SCr to see if any improvement throughout the day. Lasix is on hold. Pt voiced that he really want's to pursue cardiac cath to get to the bottom line and to r/o obstructive CAD because he is fearful of having a MI. He understands the risk of worsening renal function with cath which includes possible need for HD down the road.   For questions or updates, please contact [place] Please consult [email or web address] for contact info under Cardiology/STEMI.      Signed, Lyda Jester, PA-C  07/27/2017, 8:01 AM

## 2017-07-27 NOTE — Progress Notes (Signed)
ANTICOAGULATION CONSULT NOTE - Follow Up Consult  Pharmacy Consult for Heparin Indication: chest pain/ACS  Allergies  Allergen Reactions  . Predicort [Prednisolone] Other (See Comments)    Stomach pain  . Ciprofloxacin     GI upset  . Hydrochlorothiazide Other (See Comments)    Dehydration  . Hydrocodone     Stomach upset  . Hydrocodone-Acetaminophen     Stomach upset  . Hydrocodone-Acetaminophen Nausea Only  . Loratadine Other (See Comments)    Other reaction(s): Unknown  . Sulfa Antibiotics Other (See Comments)    Cannot recall  . Sulfacetamide Sodium Other (See Comments)    Cannot recall  . Sulfasalazine     Other reaction(s): Other (See Comments) Cannot recall  . Hydrocodone-Acetaminophen Nausea Only  . Penicillins Hives and Rash    Has patient had a PCN reaction causing immediate rash, facial/tongue/throat swelling, SOB or lightheadedness with hypotension: YES Has patient had a PCN reaction causing severe rash involving mucus membranes or skin necrosis: NO Has patient had a PCN reaction that required hospitalization NO Has patient had a PCN reaction occurring within the last 10 years:NO If all of the above answers are "NO", then may proceed with Cephalosporin use.    Patient Measurements: Height: 5\' 10"  (177.8 cm) Weight: 228 lb 14.4 oz (103.8 kg) IBW/kg (Calculated) : 73 Heparin Dosing Weight:  95 kg  Vital Signs: Temp: 97.9 F (36.6 C) (02/27 1042) Temp Source: Oral (02/27 1042) BP: 161/79 (02/27 1042) Pulse Rate: 66 (02/27 1042)  Labs: Recent Labs    07/26/17 2203 07/27/17 0337 07/27/17 1104  HGB 13.6  --   --   HCT 41.6  --   --   PLT 152  --   --   HEPARINUNFRC  --   --  0.33  CREATININE 2.67*  --   --   TROPONINI  --  <0.03 <0.03    Estimated Creatinine Clearance: 29.7 mL/min (A) (by C-G formula based on SCr of 2.67 mg/dL (H)).   Assessment: CC/HPI:  72yo male w/ h/o CABG c/o CP associated w/ SOB and nausea  Anticoag: heparin for ACS.  Baseline CBC WNL. Troponins negative. -HL= 0.33.    Goal of Therapy:  Heparin level 0.3-0.7 units/ml Monitor platelets by anticoagulation protocol: Yes   Plan:  Continue IV heparin 1300 units/hr Confirm level in 6 hrs. Daily HL and CBC   Garima Chronis S. Alford Highland, PharmD, BCPS Clinical Staff Pharmacist Pager [phone]  Eilene Ghazi Stillinger 07/27/2017,2:50 PM

## 2017-07-27 NOTE — Progress Notes (Signed)
Called nurse back to receive report busy and will call back.

## 2017-07-28 ENCOUNTER — Inpatient Hospital Stay (HOSPITAL_COMMUNITY)
Admission: EM | Disposition: A | Discharge status: Home / Self Care | Payer: Self-pay | Source: Home / Self Care | Attending: Cardiology

## 2017-07-28 DIAGNOSIS — I25710 Atherosclerosis of autologous vein coronary artery bypass graft(s) with unstable angina pectoris: Secondary | ICD-10-CM

## 2017-07-28 DIAGNOSIS — N183 Chronic kidney disease, stage 3 (moderate): Secondary | ICD-10-CM

## 2017-07-28 HISTORY — PX: LEFT HEART CATH AND CORS/GRAFTS ANGIOGRAPHY: CATH118250

## 2017-07-28 LAB — BASIC METABOLIC PANEL
Anion gap: 9 (ref 5–15)
BUN: 34 mg/dL — ABNORMAL HIGH (ref 6–20)
CALCIUM: 8.7 mg/dL — AB (ref 8.9–10.3)
CO2: 22 mmol/L (ref 22–32)
Chloride: 112 mmol/L — ABNORMAL HIGH (ref 101–111)
Creatinine, Ser: 1.96 mg/dL — ABNORMAL HIGH (ref 0.61–1.24)
GFR calc Af Amer: 37 mL/min — ABNORMAL LOW (ref >60)
GFR, EST NON AFRICAN AMERICAN: 32 mL/min — AB (ref >60)
GLUCOSE: 103 mg/dL — AB (ref 65–99)
POTASSIUM: 4.2 mmol/L (ref 3.5–5.1)
SODIUM: 143 mmol/L (ref 135–145)

## 2017-07-28 LAB — CBC
HEMATOCRIT: 35.7 % — AB (ref 39.0–52.0)
Hemoglobin: 11.6 g/dL — ABNORMAL LOW (ref 13.0–17.0)
MCH: 28.5 pg (ref 26.0–34.0)
MCHC: 32.5 g/dL (ref 30.0–36.0)
MCV: 87.7 fL (ref 78.0–100.0)
Platelets: 130 10*3/uL — ABNORMAL LOW (ref 150–400)
RBC: 4.07 MIL/uL — AB (ref 4.22–5.81)
RDW: 14.2 % (ref 11.5–15.5)
WBC: 6.4 10*3/uL (ref 4.0–10.5)

## 2017-07-28 LAB — PROTIME-INR
INR: 1.04
PROTHROMBIN TIME: 13.5 s (ref 11.4–15.2)

## 2017-07-28 LAB — POCT ACTIVATED CLOTTING TIME: Activated Clotting Time: 136 seconds

## 2017-07-28 LAB — GLUCOSE, CAPILLARY: Glucose-Capillary: 100 mg/dL — ABNORMAL HIGH (ref 65–99)

## 2017-07-28 LAB — HEPARIN LEVEL (UNFRACTIONATED): HEPARIN UNFRACTIONATED: 0.39 [IU]/mL (ref 0.30–0.70)

## 2017-07-28 SURGERY — LEFT HEART CATH AND CORS/GRAFTS ANGIOGRAPHY
Anesthesia: LOCAL

## 2017-07-28 MED ORDER — SODIUM CHLORIDE 0.9 % IV SOLN: 250.0000 mL | INTRAVENOUS | Status: DC | PRN

## 2017-07-28 MED ORDER — FENTANYL CITRATE (PF) 100 MCG/2ML IJ SOLN
INTRAMUSCULAR | Status: AC
Start: 2017-07-28 — End: ?
  Filled 2017-07-28: qty 2

## 2017-07-28 MED ORDER — ATORVASTATIN CALCIUM 80 MG PO TABS: 80.0000 mg | ORAL_TABLET | Freq: Every day | ORAL | Status: DC

## 2017-07-28 MED ORDER — ONDANSETRON HCL 4 MG/2ML IJ SOLN: 4.0000 mg | Freq: Four times a day (QID) | INTRAMUSCULAR | Status: DC | PRN

## 2017-07-28 MED ORDER — HEPARIN SODIUM (PORCINE) 5000 UNIT/ML IJ SOLN
5000.0000 [IU] | Freq: Three times a day (TID) | INTRAMUSCULAR | Status: DC
  Administered 2017-07-29: 5000 [IU] via SUBCUTANEOUS
  Filled 2017-07-28: qty 1

## 2017-07-28 MED ORDER — LIDOCAINE HCL (PF) 1 % IJ SOLN
INTRAMUSCULAR | Status: DC | PRN
  Administered 2017-07-28: 15 mL

## 2017-07-28 MED ORDER — MIDAZOLAM HCL 2 MG/2ML IJ SOLN
INTRAMUSCULAR | Status: AC
  Filled 2017-07-28: qty 2

## 2017-07-28 MED ORDER — ACETAMINOPHEN 325 MG PO TABS: 650.0000 mg | ORAL_TABLET | ORAL | Status: DC | PRN

## 2017-07-28 MED ORDER — MIDAZOLAM HCL 2 MG/2ML IJ SOLN
INTRAMUSCULAR | Status: DC | PRN
  Administered 2017-07-28: 2 mg via INTRAVENOUS

## 2017-07-28 MED ORDER — IOPAMIDOL (ISOVUE-370) INJECTION 76%
INTRAVENOUS | Status: DC | PRN
  Administered 2017-07-28: 75 mL via INTRA_ARTERIAL

## 2017-07-28 MED ORDER — SODIUM CHLORIDE 0.9% FLUSH
3.0000 mL | Freq: Two times a day (BID) | INTRAVENOUS | Status: DC
  Administered 2017-07-28 – 2017-07-29 (×2): 3 mL via INTRAVENOUS

## 2017-07-28 MED ORDER — FENTANYL CITRATE (PF) 100 MCG/2ML IJ SOLN
INTRAMUSCULAR | Status: DC | PRN
  Administered 2017-07-28: 25 ug via INTRAVENOUS

## 2017-07-28 MED ORDER — HEPARIN (PORCINE) IN NACL 2-0.9 UNIT/ML-% IJ SOLN
INTRAMUSCULAR | Status: AC
  Filled 2017-07-28: qty 1000

## 2017-07-28 MED ORDER — LIDOCAINE HCL (PF) 1 % IJ SOLN
INTRAMUSCULAR | Status: AC
  Filled 2017-07-28: qty 30

## 2017-07-28 MED ORDER — SODIUM CHLORIDE 0.9 % IV SOLN
INTRAVENOUS | Status: DC
  Administered 2017-07-28 (×2): via INTRAVENOUS

## 2017-07-28 MED ORDER — IOPAMIDOL (ISOVUE-370) INJECTION 76%
INTRAVENOUS | Status: AC
  Filled 2017-07-28: qty 150

## 2017-07-28 MED ORDER — HEPARIN (PORCINE) IN NACL 2-0.9 UNIT/ML-% IJ SOLN
INTRAMUSCULAR | Status: AC | PRN
  Administered 2017-07-28 (×2): 500 mL

## 2017-07-28 MED ORDER — SODIUM CHLORIDE 0.9% FLUSH: 3.0000 mL | INTRAVENOUS | Status: DC | PRN

## 2017-07-28 SURGICAL SUPPLY — 6 items
CATH INFINITI 5FR MULTPACK ANG (CATHETERS) ×2 IMPLANT
KIT HEART LEFT (KITS) ×2 IMPLANT
PACK CARDIAC CATHETERIZATION (CUSTOM PROCEDURE TRAY) ×2 IMPLANT
SHEATH PINNACLE 5F 10CM (SHEATH) ×2 IMPLANT
TRANSDUCER W/STOPCOCK (MISCELLANEOUS) ×2 IMPLANT
WIRE EMERALD 3MM-J .035X150CM (WIRE) ×2 IMPLANT

## 2017-07-28 NOTE — Interval H&P Note (Signed)
Cath Lab Visit (complete for each Cath Lab visit)  Clinical Evaluation Leading to the Procedure:   ACS: No.  Non-ACS:    Anginal Classification: CCS IV  Anti-ischemic medical therapy: Maximal Therapy (2 or more classes of medications)  Non-Invasive Test Results: No non-invasive testing performed  Prior CABG: Previous CABG      History and Physical Interval Note:  07/28/2017 9:23 AM  Ronald [name]  has presented today for surgery, with the diagnosis of ua  The various methods of treatment have been discussed with the patient and family. After consideration of risks, benefits and other options for treatment, the patient has consented to  Procedure(s): LEFT HEART CATH AND CORS/GRAFTS ANGIOGRAPHY (N/A) as a surgical intervention .  The patient's history has been reviewed, patient examined, no change in status, stable for surgery.  I have reviewed the patient's chart and labs.  Questions were answered to the patient's satisfaction.     Shelva Majestic

## 2017-07-28 NOTE — Progress Notes (Signed)
Transported to cath lab pre meds given and off tele ccmd aware

## 2017-07-28 NOTE — Progress Notes (Signed)
45fr sheath aspirated and removed from rfa, manual pressure applied for 20 minutes. Groin level 0. Tegaderm dressing applied, bedrest instructions given.  Bilateral dp and pt pulses present with doppler.  Bedrest begins at 11:15:00

## 2017-07-28 NOTE — Progress Notes (Signed)
Progress Note  Patient Name: Michai Dieppa [facility] Date of Encounter: 07/28/2017  Primary Cardiologist: Lauree Chandler, MD   Subjective   Going for [facility] today for recent CP.   Inpatient Medications    Scheduled Meds: . amLODipine  5 mg Oral Daily  . aspirin  81 mg Oral q morning - 10a  . atorvastatin  40 mg Oral Daily  . carvedilol  25 mg Oral BID  . clopidogrel  75 mg Oral Daily  . pantoprazole  40 mg Oral Daily  . sodium bicarbonate  650 mg Oral BID  . sodium chloride flush  3 mL Intravenous Q12H   Continuous Infusions: . sodium chloride 100 mL/hr at 07/27/17 0945  . sodium chloride    . sodium chloride 100 mL/hr at 07/28/17 0215  . heparin 1,300 Units/hr (07/27/17 2000)   PRN Meds: sodium chloride, nitroGLYCERIN, ondansetron (ZOFRAN) IV, pneumococcal 23 valent vaccine, sodium chloride flush   Vital Signs    Vitals:   07/27/17 1600 07/27/17 2010 07/28/17 0008 07/28/17 0617  BP: (!) 142/61 (!) 145/60 (!) 139/55 (!) 143/97  Pulse: 64 70 76 71  Resp: 20 18 18 18   Temp: 98.1 F (36.7 C) 98.6 F (37 C) 98 F (36.7 C) 97.8 F (36.6 C)  TempSrc: Oral Oral Oral Oral  SpO2: 96% 98% 95% 98%  Weight:    231 lb 3.2 oz (104.9 kg)  Height:        Intake/Output Summary (Last 24 hours) at 07/28/2017 0905 Last data filed at 07/28/2017 6222 Gross per 24 hour  Intake 2396.27 ml  Output 2525 ml  Net -128.73 ml   Filed Weights   07/26/17 2215 07/27/17 1042 07/28/17 0617  Weight: 230 lb (104.3 kg) 228 lb 14.4 oz (103.8 kg) 231 lb 3.2 oz (104.9 kg)    Telemetry    SR with frequent PVCs - Personally Reviewed  ECG    SR w/ Frequent PVCs - Personally Reviewed  Physical Exam   GEN: No acute distress.   Neck: No JVD Cardiac: RRR, no murmurs, rubs, or gallops.  Respiratory: Clear to auscultation bilaterally. GI: Soft, nontender, non-distended  MS: No edema; No deformity. Neuro:  Nonfocal  Psych: Normal affect   Labs    Chemistry Recent Labs  Lab 07/26/17 2203  07/28/17 0454  NA 140 143  K 4.2 4.2  CL 106 112*  CO2 24 22  GLUCOSE 128* 103*  BUN 43* 34*  CREATININE 2.67* 1.96*  CALCIUM 9.5 8.7*  GFRNONAA 22* 32*  GFRAA 26* 37*  ANIONGAP 10 9     Hematology Recent Labs  Lab 07/26/17 2203 07/28/17 0454  WBC 7.8 6.4  RBC 4.69 4.07*  HGB 13.6 11.6*  HCT 41.6 35.7*  MCV 88.7 87.7  MCH 29.0 28.5  MCHC 32.7 32.5  RDW 14.7 14.2  PLT 152 130*    Cardiac Enzymes Recent Labs  Lab 07/27/17 0337 07/27/17 1104 07/27/17 1634  TROPONINI <0.03 <0.03 <0.03    Recent Labs  Lab 07/26/17 2221 07/27/17 0208  TROPIPOC 0.01 0.00     BNPNo results for input(s): BNP, PROBNP in the last 168 hours.   DDimer No results for input(s): DDIMER in the last 168 hours.   Radiology    Dg Chest 2 View  Result Date: 07/26/2017 CLINICAL DATA:  Chest pain EXAM: CHEST  2 VIEW COMPARISON:  07/08/2017 FINDINGS: Cardiomegaly. Prior CABG. No confluent airspace opacities or effusions. Scarring in the lung bases bilaterally. IMPRESSION: Cardiomegaly.  Bibasilar scarring.  No active disease. Electronically Signed   By: Rolm Baptise M.D.   On: 07/26/2017 22:25    Cardiac Studies   LHC 07/28/17 - pending   2D Echo 07/14/17 (Done Outpatient- prior to admit) Study Conclusions  - Left ventricle: The cavity size was mildly dilated. Systolic function was normal. The estimated ejection fraction was in the range of 55% to 60%. Doppler parameters are consistent with abnormal left ventricular relaxation (grade 1 diastolic dysfunction).  Vas [place] AAA 07/11/17 (Done Outpatient- prior to admit) Abdominal Aorta: There is evidence of abnormal dilitation of the Mid Abdominal aorta. The largest aortic measurement is 2.8 cm. The largest aortic diameter remains essentially unchanged compared to prior exam. Previous diameter measurement was 3.0 cm obtained on 10/15.    Patient Profile     [facility] a 72 y.o.male, followed by Dr. Angelena Form, who has known  CADs/p CABG in 1996 (LIMA-LAD, SVG-circumflex, SVG-RCA, SVG-diagonal) with redo bypass in 2007 (SVG-OM, SVG-RCA), subsequent PCIs (DES to SVG-OM 2016, DES to SVG-diagonal in 2017), ischemic cardiomyopathy/chronic combined CHF, CKD III-IV, DM 2, HTN, HLD, renal artery stenosis by CT 2008, AAA, COPD, GERD and PVCs. He has had a h/oflucutating LV dysfunction dating back to echo in 2006 when EF was 40-45%. 2D echo in 07/2017 showed normal LV systolic function.He also has notable truncal obesity and is very sedentary, 2/2 chronic LBP. Pt presented to hospital overnight with complaint of SSCP concerning for unstable angina.    Assessment & Plan    1. Unstable Angina: cardiac enzymes negative x 3. Plan for [facility] today.   2. CAD: s/p CABG in 1996 (LIMA-LAD, SVG-circumflex, SVG-RCA, SVG-diagonal) with redo bypass in 2007 (SVG-OM, SVG-RCA), subsequent PCIs (DES to SVG-OM 2016, DES to SVG-diagonal in 2017). Pt with recent SSCP and exertional dyspnea, concerning for unstable angina. Plan is for [facility] today +/- PCI.   3. CKD: SCr on admit 2.67, which is slightly higher than his usual baseline. Since 2017, he has had SCr in the 2.0-2.5 range. Pt agressively hydrated pre cath with IVFs. SCr improved to 1.96 pre cath. Internationalist to limit dye use as much as possible. No LV gram. Pt aware of potential risk of worsening renal function and consented to cath. Will monitor closely and will continue IVF hydration post cath.   4. H/o Ischemic CM/ Chronic Diastolic HF:EF previously 40-45% in 2006. Recent echo showed normal LVEF and G1DD. Continue medical therapy. No ACE/ARB given CKD. Pt has received IVFs for precath hydration. Will need to monitor volume status closely, given diastolic dysfunction. I/Os are being recorded. Currently negative fluid balance.   5. Frequent PVCs: pt recently evaluated in clinic for this with plans to get 48 hr holter to assess PVC burden. Continue Coreg for now. K is WNL. ? If ischemically  mediated. Ischemic w/u pending.   6. HTN: mildly elevated. Continue current regimen and monitor.   7. AAA: stable by recent US 07/11/17.  The largest aortic measurement is 2.8 cm. The largest aortic diameter remains essentially unchanged compared to prior exam. Previous diameter measurement was 3.0 cm obtained on 02/2014. Continue to follow yearly. Keep BP controlled. Continue on BB. Pt denies tobacco use.    For questions or updates, please contact [place] Please consult [email or web address] for contact info under Cardiology/STEMI.      Signed, Lyda Jester, PA-C  07/28/2017, 9:05 AM

## 2017-07-29 ENCOUNTER — Encounter (HOSPITAL_COMMUNITY): Payer: Self-pay | Admitting: Cardiovascular Disease

## 2017-07-29 ENCOUNTER — Other Ambulatory Visit: Payer: Self-pay | Admitting: Physician Assistant

## 2017-07-29 DIAGNOSIS — N184 Chronic kidney disease, stage 4 (severe): Secondary | ICD-10-CM

## 2017-07-29 DIAGNOSIS — Z951 Presence of aortocoronary bypass graft: Secondary | ICD-10-CM

## 2017-07-29 DIAGNOSIS — E782 Mixed hyperlipidemia: Secondary | ICD-10-CM

## 2017-07-29 DIAGNOSIS — I120 Hypertensive chronic kidney disease with stage 5 chronic kidney disease or end stage renal disease: Secondary | ICD-10-CM | POA: Insufficient documentation

## 2017-07-29 DIAGNOSIS — E1122 Type 2 diabetes mellitus with diabetic chronic kidney disease: Secondary | ICD-10-CM | POA: Insufficient documentation

## 2017-07-29 DIAGNOSIS — E669 Obesity, unspecified: Secondary | ICD-10-CM

## 2017-07-29 LAB — BASIC METABOLIC PANEL
Anion gap: 8 (ref 5–15)
BUN: 25 mg/dL — ABNORMAL HIGH (ref 6–20)
CALCIUM: 9 mg/dL (ref 8.9–10.3)
CHLORIDE: 114 mmol/L — AB (ref 101–111)
CO2: 21 mmol/L — AB (ref 22–32)
CREATININE: 1.7 mg/dL — AB (ref 0.61–1.24)
GFR calc non Af Amer: 38 mL/min — ABNORMAL LOW (ref >60)
GFR, EST AFRICAN AMERICAN: 44 mL/min — AB (ref >60)
GLUCOSE: 92 mg/dL (ref 65–99)
Potassium: 4.1 mmol/L (ref 3.5–5.1)
Sodium: 143 mmol/L (ref 135–145)

## 2017-07-29 LAB — CBC
HEMATOCRIT: 35.9 % — AB (ref 39.0–52.0)
HEMOGLOBIN: 11.6 g/dL — AB (ref 13.0–17.0)
MCH: 28.6 pg (ref 26.0–34.0)
MCHC: 32.3 g/dL (ref 30.0–36.0)
MCV: 88.4 fL (ref 78.0–100.0)
Platelets: 132 10*3/uL — ABNORMAL LOW (ref 150–400)
RBC: 4.06 MIL/uL — ABNORMAL LOW (ref 4.22–5.81)
RDW: 14.5 % (ref 11.5–15.5)
WBC: 6 10*3/uL (ref 4.0–10.5)

## 2017-07-29 MED ORDER — ISOSORBIDE MONONITRATE ER 30 MG PO TB24: 30.0000 mg | ORAL_TABLET | Freq: Every day | ORAL | Status: DC

## 2017-07-29 MED ORDER — ATORVASTATIN CALCIUM 80 MG PO TABS: 80.0000 mg | ORAL_TABLET | Freq: Every evening | ORAL | 3 refills | Status: DC

## 2017-07-29 MED ORDER — ISOSORBIDE MONONITRATE ER 30 MG PO TB24: 30.0000 mg | ORAL_TABLET | Freq: Every day | ORAL | 3 refills | Status: DC

## 2017-07-29 MED FILL — Heparin Sodium (Porcine) 2 Unit/ML in Sodium Chloride 0.9%: INTRAMUSCULAR | Qty: 1000 | Status: AC

## 2017-07-29 NOTE — Progress Notes (Signed)
Progress Note  Patient Name: Ronald [name] Date of Encounter: 07/29/2017  Primary Cardiologist: Dr. Angelena Form  Subjective   Feeling much better this AM. Eager to go home. Plans to f/u with GI as well. Had brief run of suspected atrial tach on telemetry this AM; not clear if he was specifically symptomatic at that time but does report occasional skipping (has h/o PVCs).  Inpatient Medications    Scheduled Meds: . amLODipine  5 mg Oral Daily  . aspirin  81 mg Oral q morning - 10a  . atorvastatin  80 mg Oral q1800  . carvedilol  25 mg Oral BID  . clopidogrel  75 mg Oral Daily  . heparin  5,000 Units Subcutaneous Q8H  . pantoprazole  40 mg Oral Daily  . sodium bicarbonate  650 mg Oral BID  . sodium chloride flush  3 mL Intravenous Q12H   Continuous Infusions: . sodium chloride 100 mL/hr at 07/27/17 0945  . sodium chloride 150 mL/hr at 07/28/17 2014  . sodium chloride     PRN Meds: sodium chloride, acetaminophen, nitroGLYCERIN, ondansetron (ZOFRAN) IV, pneumococcal 23 valent vaccine, sodium chloride flush   Vital Signs    Vitals:   07/28/17 1930 07/28/17 2101 07/29/17 0513 07/29/17 1001  BP: 128/62 (!) 143/57 (!) 141/66 (!) 145/64  Pulse: 79  65   Resp: 18  18   Temp: 98 F (36.7 C)  98.2 F (36.8 C)   TempSrc: Oral  Oral   SpO2: 98%  96%   Weight:   230 lb 3.2 oz (104.4 kg)   Height:        Intake/Output Summary (Last 24 hours) at 07/29/2017 1045 Last data filed at 07/29/2017 1003 Gross per 24 hour  Intake 2857.5 ml  Output 2870 ml  Net -12.5 ml   Filed Weights   07/27/17 1042 07/28/17 0617 07/29/17 0513  Weight: 228 lb 14.4 oz (103.8 kg) 231 lb 3.2 oz (104.9 kg) 230 lb 3.2 oz (104.4 kg)    Telemetry    NSR occ PVCs brief run of what appears to be an atrial tach - Personally Reviewed  Physical Exam   GEN: No acute distress.  HEENT: Normocephalic, atraumatic, sclera non-icteric. Neck: No JVD or bruits. Cardiac: RRR no murmurs, rubs, or gallops.    Radials/DP/PT 1+ and equal bilaterally.  Respiratory: Clear to auscultation bilaterally. Breathing is unlabored. GI: Soft, nontender, non-distended, BS +x 4. MS: no deformity. Extremities: No clubbing or cyanosis. No edema. Distal pedal pulses are 2+ and equal bilaterally. Right groin cath site without hematoma or bruit. + mild ecchymosis Neuro:  AAOx3. Follows commands. Psych:  Responds to questions appropriately with a normal affect.  Labs    Chemistry Recent Labs  Lab 07/26/17 2203 07/28/17 0454 07/29/17 0452  NA 140 143 143  K 4.2 4.2 4.1  CL 106 112* 114*  CO2 24 22 21*  GLUCOSE 128* 103* 92  BUN 43* 34* 25*  CREATININE 2.67* 1.96* 1.70*  CALCIUM 9.5 8.7* 9.0  GFRNONAA 22* 32* 38*  GFRAA 26* 37* 44*  ANIONGAP 10 9 8      Hematology Recent Labs  Lab 07/26/17 2203 07/28/17 0454 07/29/17 0452  WBC 7.8 6.4 6.0  RBC 4.69 4.07* 4.06*  HGB 13.6 11.6* 11.6*  HCT 41.6 35.7* 35.9*  MCV 88.7 87.7 88.4  MCH 29.0 28.5 28.6  MCHC 32.7 32.5 32.3  RDW 14.7 14.2 14.5  PLT 152 130* 132*    Cardiac Enzymes Recent Labs  Lab  07/27/17 0337 07/27/17 1104 07/27/17 1634  TROPONINI <0.03 <0.03 <0.03    Recent Labs  Lab 07/26/17 2221 07/27/17 0208  TROPIPOC 0.01 0.00     BNPNo results for input(s): BNP, PROBNP in the last 168 hours.   DDimer No results for input(s): DDIMER in the last 168 hours.   Radiology    No results found.  Cardiac Studies   Procedures  LEFT HEART CATH AND CORS/GRAFTS ANGIOGRAPHY  Conclusion     Ost RCA to Prox RCA lesion is 95% stenosed.  Prox RCA lesion is 100% stenosed.  Prox Cx lesion is 100% stenosed.  Prox LAD lesion is 100% stenosed.  Origin lesion is 100% stenosed.  Origin lesion is 100% stenosed.  Previously placed Dist Graft to Insertion stent (unknown type) is widely patent.  Origin to Prox Graft lesion is 10% stenosed.  Prox Graft lesion is 30% stenosed.  Mid Graft to Dist Graft lesion is 30% stenosed.  Mid  Graft lesion is 50% stenosed.   Significant native CAD with total occlusion of the LAD after the takeoff of the first diagonal vessel; total occlusion of the proximal left circumflex coronary artery; and total occlusion of the proximal RCA with antegrade bridging collaterals.  Patent LIMA to LAD.  Patent Y vein graft from the 1996 surgery which supplies the diagonal vessel and distal circumflex marginal vessel.  There is diffuse narrowing of 50% in the midportion of the Y graft supplying the diagonal vessel and a patent distal stent extending to the ostium of the diagonal.  The Y graft supplying the distal marginal is free of significant disease and has mild luminal irregularity.  Occluded vein graft which had supplied the distal RCA from the 1996 surgery.  Patent SVG supplying the distal RCA from the 2007 surgery with a stent in the proximal portion of the graft with 30% narrowings in the proximal and mid segment.  Occluded vein graft from 2007 which appears to also have been stented.  LVEDP 18 mm  RECOMMENDATION: Increase medical therapy.      Patient Profile     72 y.o. male with CADs/p CABG in 1996 (LIMA-LAD, SVG-circumflex, SVG-RCA, SVG-diagonal) with redo bypass in 2007 (SVG-OM, SVG-RCA), subsequent PCIs (DES to SVG-OM 2016, DES to SVG-diagonal in 2017), ischemic cardiomyopathy/chronic combined CHF, CKD III-IV, DM 2, HTN, HLD, renal artery stenosis by CT 2008, AAA (2.9x2.6 by CT 07/2017-> f/u due 07/2022), COPD, GERD and frequent PVCs who presented to [facility] with recurrent CP/SOB. He recently had been treated with Lasix by Dr. Angelena Form without relief. Patient was hesitant to proceed with nuc given prior bad symptoms with nuc. BB titrated for PVCs with plan for OP event monitor to quantify (missed appt). Admitted for recurrent symptoms. Cardiac cath 07/28/17 with recommendation for medical therapy.   Assessment & Plan    1. Chest tightness/dyspnea with known CAD - cath yesterday  with recommendation to increase medical therapy. It appears in the [facility] that his cath disrupted several of his usual home med AM doses (I.e. Carvedilol and amlodipine), but he is back on track to receive today. Consider addition of Imdur. Brief run of atrial tach on tele this AM of unclear significance.  2. CKD IV - Cr improved with hydration. LVEDP was 18 by cath. On Lasix PRN as OP. Will discuss timing of recheck with MD. Has appt with Lyda Jester on 3/19  3. Chronic combined CHF - appears euvolemic. Most recent EF improved.  4. Frequent PVCs, also brief run of atrial  tach - will revisit plan for Holter with MD. Already on max dose of beta blocker but if has a high burden, could be considered for antiarrhythmic therapy.  5. Hyperlipidemia - statin titrated this admission. Appears to have chronically elevated alk phos. AST/ALT are normal. If the patient is tolerating statin at time of follow-up appointment, would consider rechecking liver function/lipid panel in 6-8 weeks.  For questions or updates, please contact [place] Please consult [email or web address] for contact info under Cardiology/STEMI.  Signed, Ronald Pitter, PA-C 07/29/2017, 10:45 AM

## 2017-07-29 NOTE — Discharge Summary (Signed)
Discharge Summary    Patient ID: Ronald [name],  MRN: [id number], DOB/AGE: Apr 22, 1946 72 y.o.  Admit date: 07/27/2017 Discharge date: 07/29/2017  Primary Care Provider: [name]. Primary Cardiologist: Dr. Angelena Form  Discharge Diagnoses    Principal Problem:   Unstable angina [facility]) Active Problems:   Coronary artery disease involving native coronary artery of native heart with unstable angina pectoris ([place])   History of redo bypass grafting   Diabetes mellitus, type 2 ([place])   HLD (hyperlipidemia)   CKD (chronic kidney disease), stage IV ([facility])   Obesity   Diagnostic Studies/Procedures    Procedures  LEFT HEART CATH AND CORS/GRAFTS ANGIOGRAPHY  Conclusion     Ost RCA to Prox RCA lesion is 95% stenosed.  Prox RCA lesion is 100% stenosed.  Prox Cx lesion is 100% stenosed.  Prox LAD lesion is 100% stenosed.  Origin lesion is 100% stenosed.  Origin lesion is 100% stenosed.  Previously placed Dist Graft to Insertion stent (unknown type) is widely patent.  Origin to Prox Graft lesion is 10% stenosed.  Prox Graft lesion is 30% stenosed.  Mid Graft to Dist Graft lesion is 30% stenosed.  Mid Graft lesion is 50% stenosed.  Significant native CAD with total occlusion of the LAD after the takeoff of the first diagonal vessel; total occlusion of the proximal left circumflex coronary artery; and total occlusion of the proximal RCA with antegrade bridging collaterals.  Patent LIMA to LAD.  Patent Y vein graft from the 1996 surgery which supplies the diagonal vessel and distal circumflex marginal vessel. There is diffuse narrowing of 50% in the midportion of the Y graft supplying the diagonal vessel and a patent distal stent extending to the ostium of the diagonal. The Y graft supplying the distal marginal is free of significant disease and has mild luminal irregularity.  Occluded vein graft which had supplied the distal RCA from the 1996  surgery.  Patent SVG supplying the distal RCA from the 2007 surgery with a stent in the proximal portion of the graft with 30% narrowings in the proximal and mid segment.  Occluded vein graft from 2007 which appears to also have been stented.  LVEDP 18 mm  RECOMMENDATION: Increase medical therapy.     ____________     History of Present Illness     Ronald [name] is a 72 y.o. male with history of CADs/p CABG in 1996 (LIMA-LAD, SVG-circumflex, SVG-RCA, SVG-diagonal) with redo bypass in 2007 (SVG-OM, SVG-RCA), subsequent PCIs (DES to SVG-OM 2016, DES to SVG-diagonal in 2017), ischemic cardiomyopathy/chronic combined CHF (EF 40-45% in 2006 with improvement in EF to 55-60% in 07/2017), CKD III-IV, DM 2, HTN, HLD, renal artery stenosis by CT 2008, AAA (2.9x2.6 by CT 07/2017-> f/u due 07/2022), COPD, GERD and frequent PVCswho presented to [name] with recurrent CP/SOB.  [name] Course    To recap, he does have history of flucutating LV dysfunction dating back to echo in 2006 when EF was 40-45%. 2D echo in 2014 showed normal LV systolic function, but down again in 07/2016 to 45-50%. More recently he was seen by Dr. Angelena Form with vague dyspnea. He was tried on Lasix without much relief. Beta blocker was titrated for frequent PVCs. I saw him back in clinic at which time abdominal pain of 20 years was his predominant compliant. He actually ended up going to the ED without acute findings on workup aside from 9x9 nodular opacity in the inferior lingula (recommend f/u 3 months per report),  PAD,  small ventral hernia with fat, AAA 2.9x2.6 cm (f/u due 2024). He saw Lyda Jester back in f/u for continued symptoms. He was hesitant to proceed with nuc given prior bad symptoms with nuc. Outpatient monitor was planned to quantify PVC burden. In the interim he was admitted 07/27/2017 with recurrent CP and dyspnea, somewhat different than prior angina.  1. Chest tightness/dyspnea with known CAD - troponins were  negative. Of note, recent BNP for similar sx was only 148. The patient remained hesitant to undergo stress testing so cardiac cath was recommended. He was hydrated in prep for this. Prior outpatient Cr was 2.55, this was 2.67 on arrival. Pre-cath Cr 1.96. Cardiac cath showed significant CAD with patent LIMA to LAD, occluded SVG to distal RCA, patent "Y" graft to diag and OM and SVG. No clear interventional options. Acceleration of medical therapy was recommended so Imdur was added. Dr. Debara Pickett recommended to hold off event monitor in lieu of following response to Imdur. He was continued on home amlodipine and beta blocker.  2. CKD IV - recent Cr as above, with post-hydration DC creatinine of 1.7. He actually reports feeling much better with the hydration. He was only on PRN Lasix as OP and not taking regularly.  3. Chronic combined CHF - appears euvolemic. Most recent EF improved.  4. Frequent PVCs, also brief run of atrial tach - on telemetry this morning around 10:30am he had a very brief run of atrial tach. It was not clear he was symptomatic with this. He reported h/o palpitations but not specifically with this event. Will continue BB (of note, he only got one dose yesterday due to disruption in schedule with cath). Per discussion with Dr. Debara Pickett, hold off event monitor in lieu of following response to Imdur. Will keep f/u for further plans.  5. Hyperlipidemia - statin titrated this admission. Appears to have chronically elevated alk phos. AST/ALT are normal. If the patient is tolerating statin at time of follow-up appointment, would consider rechecking liver function/lipid panel in 6-8 weeks. The patient plans to pursue GI workup.    _____________  Discharge Vitals Blood pressure (!) 145/64, pulse 65, temperature 98.2 F (36.8 C), temperature source Oral, resp. rate 18, height 5' 10"  (1.778 m), weight 230 lb 3.2 oz (104.4 kg), SpO2 96 %.  Filed Weights   07/27/17 1042 07/28/17 0617 07/29/17  0513  Weight: 228 lb 14.4 oz (103.8 kg) 231 lb 3.2 oz (104.9 kg) 230 lb 3.2 oz (104.4 kg)    Labs & Radiologic Studies    CBC Recent Labs    07/28/17 0454 07/29/17 0452  WBC 6.4 6.0  HGB 11.6* 11.6*  HCT 35.7* 35.9*  MCV 87.7 88.4  PLT 130* 025*   Basic Metabolic Panel Recent Labs    07/28/17 0454 07/29/17 0452  NA 143 143  K 4.2 4.1  CL 112* 114*  CO2 22 21*  GLUCOSE 103* 92  BUN 34* 25*  CREATININE 1.96* 1.70*  CALCIUM 8.7* 9.0   Cardiac Enzymes Recent Labs    07/27/17 0337 07/27/17 1104 07/27/17 1634  TROPONINI <0.03 <0.03 <0.03   _____________  Ct Abdomen Pelvis Wo Contrast  Result Date: 07/08/2017 CLINICAL DATA:  Chronic abdominal pain EXAM: CT ABDOMEN AND PELVIS WITHOUT CONTRAST TECHNIQUE: Multidetector CT imaging of the abdomen and pelvis was performed following the standard protocol without IV contrast. Oral contrast was administered. COMPARISON:  CT abdomen Oct 21, 2006 FINDINGS: Lower chest: There is patchy bibasilar atelectatic change. There is a nodular opacity  in the inferior lingula measuring 9 x 9 mm, seen on axial slice 6 series 5. Hepatobiliary: No focal liver lesions are evident on this noncontrast enhanced study. Gallbladder is absent. There is no appreciable biliary duct dilatation. Pancreas: No pancreatic mass or inflammatory focus. Spleen: No splenic lesions are evident. Adrenals/Urinary Tract: Adrenals bilaterally appear normal. There is a cyst arising from the mid left kidney measuring 3.3 x 2.8 cm. There is a cyst arising from the anterior lower pole left kidney measuring 2.5 x 1.8 cm. There is a cyst arising from the lower pole left kidney measuring 2.1 x 2.0 cm. There is a cyst arising from the posterior mid right kidney measuring 3.2 x 3.2 cm. There is no evident hydronephrosis on either side. There is no renal or ureteral calculus on either side. Urinary bladder is midline with wall thickness within normal limits. Stomach/Bowel: There is no  appreciable bowel wall or mesenteric thickening. No evident bowel obstruction. No appreciable free air or portal venous air. Vascular/Lymphatic: There is atherosclerotic calcification throughout the aorta and common iliac arteries. There is also internal and external iliac artery atherosclerotic calcification. There is slight dilatation of the distal abdominal aorta with a maximum transverse diameter of 2.9 x 2.6 cm. No periaortic fluid evident. Major mesenteric vessels appear patent on this noncontrast enhanced study, although there is moderate atherosclerotic calcification in the proximal major mesenteric arterial vessels. There is moderate atherosclerotic calcification in each common femoral artery. There is no appreciable adenopathy in the abdomen or pelvis. Reproductive: There are prostatic calculi. Prostate and seminal vesicles appear normal in size and contour. No evident pelvic mass. Other: Appendix appears unremarkable. There is no ascites or abscess in the abdomen or pelvis. There is a fairly small ventral hernia containing only fat. Musculoskeletal: There is degenerative change throughout portions of the lumbar spine. There are no blastic or lytic bone lesions. There is no intramuscular or abdominal wall lesion. IMPRESSION: 1. 9 x 9 mm nodular opacity in the inferior lingula. Consider one of the following in 3 months for both low-risk and high-risk individuals: (a) repeat chest CT, (b) follow-up PET-CT, or (c) tissue sampling. This recommendation follows the consensus statement: Guidelines for Management of Incidental Pulmonary Nodules Detected on CT Images: From the Fleischner Society 2017; Radiology 2017; 284:228-243. 2.  No bowel obstruction.  No abscess.  Appendix appears normal. 3. No renal or ureteral calculi. There are prostatic calculi. No hydronephrosis on either side. 4. Multifocal aortoiliac and femoral arterial atherosclerosis as well as multiple foci of mesenteric arterial atherosclerosis.  Dilatation of the distal abdominal aorta with a maximum transverse diameter of 2.9 x 2.6 cm. Ectatic abdominal aorta at risk for aneurysm development. Recommend followup by ultrasound in 5 years. This recommendation follows ACR consensus guidelines: White Paper of the ACR Incidental Findings Committee II on Vascular Findings. J Am Coll Radiol 2013; 10:789-794. 5.  Fairly small ventral hernia containing only fat. 6.  Gallbladder absent. Aortic Atherosclerosis (ICD10-I70.0). Electronically Signed   By: Lowella Grip III M.D.   On: 07/08/2017 17:07   Dg Chest 2 View  Result Date: 07/26/2017 CLINICAL DATA:  Chest pain EXAM: CHEST  2 VIEW COMPARISON:  07/08/2017 FINDINGS: Cardiomegaly. Prior CABG. No confluent airspace opacities or effusions. Scarring in the lung bases bilaterally. IMPRESSION: Cardiomegaly.  Bibasilar scarring.  No active disease. Electronically Signed   By: Rolm Baptise M.D.   On: 07/26/2017 22:25   Dg Chest 2 View  Result Date: 07/08/2017 CLINICAL DATA:  Mid sternal  chest burning over the last 2 days. EXAM: CHEST  2 VIEW COMPARISON:  07/13/2016 FINDINGS: Artifact overlies the chest. Previous median sternotomy and CABG. Chronic cardiomegaly and aortic atherosclerosis. Chronic scarring in the lower lungs. Chronic prominence of the epicardial fat pads. No sign of acute edema, infiltrate, collapse or effusion. IMPRESSION: No acute findings. Previous CABG. Cardiomegaly and aortic atherosclerosis. Pulmonary scarring. Electronically Signed   By: Nelson Chimes M.D.   On: 07/08/2017 13:42   Disposition   Pt is being discharged home today in good condition.  Follow-up Plans & Appointments    Follow-up Information    Consuelo Pandy, PA-C Follow up.   Specialties:  Cardiology, Radiology Why:  Keep appointment as scheduled 08/16/17 with Lyda Jester PA-C. Dr. Debara Pickett has recommended to hold off the monitor for now and see how you feel with the new medicine. Contact information: [place] [address] [phone]        [place] [facility] Follow up.   Specialty:  Cardiology Why:  [facility] - please come to the office on Monday 08/01/17 to have lab drawn for kidney function. You can come anytime between 7:30am-4:30pm. Contact information: [address] [place] [phone]         Discharge Instructions    Diet - low sodium heart healthy   Complete by:  As directed    Discharge instructions   Complete by:  As directed    Please note the following med changes: Atorvastatin was increased to 41m. Isosorbide was also added (a long acting nitroglycerin).   Increase activity slowly   Complete by:  As directed    No driving for 2 days. No lifting over 5 lbs for 1 week. No sexual activity for 1 week. Keep procedure site clean & dry. If you notice increased pain, swelling, bleeding or pus, call/return!  You may shower, but no soaking baths/hot tubs/pools for 1 week.      Discharge Medications   Allergies as of 07/29/2017      Reactions   Predicort [prednisolone] Other (See Comments)   Stomach pain   Ciprofloxacin    GI upset   Hydrochlorothiazide Other (See Comments)   Dehydration   Hydrocodone    Stomach upset   Hydrocodone-acetaminophen    Stomach upset   Hydrocodone-acetaminophen Nausea Only   Loratadine Other (See Comments)   Other reaction(s): Unknown   Sulfa Antibiotics Other (See Comments)   Cannot recall   Sulfacetamide Sodium Other (See Comments)   Cannot recall   Sulfasalazine    Other reaction(s): Other (See Comments) Cannot recall   Hydrocodone-acetaminophen Nausea Only   Penicillins Hives, Rash   Has patient had a PCN reaction causing immediate rash, facial/tongue/throat swelling, SOB or lightheadedness with hypotension: YES Has patient had a PCN reaction causing severe rash involving mucus membranes or skin necrosis: NO Has patient had a PCN reaction  that required hospitalization NO Has patient had a PCN reaction occurring within the last 10 years:NO If all of the above answers are "NO", then may proceed with Cephalosporin use.      Medication List    TAKE these medications   allopurinol 100 MG tablet Commonly known as:  ZYLOPRIM TAKE 1 TABLET BY MOUTH AT  BEDTIME   amLODipine 5 MG tablet Commonly known as:  NORVASC TAKE 1 TABLET BY MOUTH  DAILY What changed:    how much to take  how to take  this  when to take this   aspirin 81 MG tablet Take 81 mg by mouth every morning.   atorvastatin 80 MG tablet Commonly known as:  LIPITOR Take 1 tablet (80 mg total) by mouth every evening. What changed:    medication strength  how much to take  when to take this   carvedilol 25 MG tablet Commonly known as:  COREG Take 1 tablet (25 mg total) by mouth 2 (two) times daily.   clopidogrel 75 MG tablet Commonly known as:  PLAVIX TAKE 1 TABLET BY MOUTH  DAILY   furosemide 40 MG tablet Commonly known as:  LASIX TAKE 1 TABLET BY MOUTH EVERY MORNING TAKE AS NEEDED FOR LEG SWELLING   isosorbide mononitrate 30 MG 24 hr tablet Commonly known as:  IMDUR Take 1 tablet (30 mg total) by mouth daily. Start taking on:  07/30/2017   magnesium oxide 400 (241.3 Mg) MG tablet Commonly known as:  MAG-OX TAKE 1 TABLET BY MOUTH TWO TIMES DAILY.   multivitamin tablet Take 1 tablet by mouth every morning.   NITROSTAT 0.4 MG SL tablet Generic drug:  nitroGLYCERIN Place 0.4 mg under the tongue every 5 (five) minutes as needed for chest pain (Up to 3 times).   nystatin cream Commonly known as:  MYCOSTATIN Apply 1 application topically 2 (two) times daily as needed for dry skin.   pantoprazole 40 MG tablet Commonly known as:  PROTONIX Take 1 tablet (40 mg total) by mouth daily.   ranitidine 150 MG tablet Commonly known as:  ZANTAC Take 150 mg by mouth at bedtime.   sodium bicarbonate 650 MG tablet Take 650 mg by mouth 2 (two)  times daily.        Allergies:  Allergies  Allergen Reactions  . Predicort [Prednisolone] Other (See Comments)    Stomach pain  . Ciprofloxacin     GI upset  . Hydrochlorothiazide Other (See Comments)    Dehydration  . Hydrocodone     Stomach upset  . Hydrocodone-Acetaminophen     Stomach upset  . Hydrocodone-Acetaminophen Nausea Only  . Loratadine Other (See Comments)    Other reaction(s): Unknown  . Sulfa Antibiotics Other (See Comments)    Cannot recall  . Sulfacetamide Sodium Other (See Comments)    Cannot recall  . Sulfasalazine     Other reaction(s): Other (See Comments) Cannot recall  . Hydrocodone-Acetaminophen Nausea Only  . Penicillins Hives and Rash    Has patient had a PCN reaction causing immediate rash, facial/tongue/throat swelling, SOB or lightheadedness with hypotension: YES Has patient had a PCN reaction causing severe rash involving mucus membranes or skin necrosis: NO Has patient had a PCN reaction that required hospitalization NO Has patient had a PCN reaction occurring within the last 10 years:NO If all of the above answers are "NO", then may proceed with Cephalosporin use.   Outstanding Labs/Studies   N/A  Duration of Discharge Encounter   Greater than 30 minutes including physician time.  Signed, Charlie Pitter PA-C 07/29/2017, 12:10 PM

## 2017-07-29 NOTE — Plan of Care (Signed)
  Progressing Safety: Ability to remain free from injury will improve 07/29/2017 0606 - Progressing by Ardine Eng, RN

## 2017-07-29 NOTE — Progress Notes (Signed)
Pt had quiet night.  No signs distress to report.  Pt looking forward to going home. Pt is stable

## 2017-08-01 ENCOUNTER — Other Ambulatory Visit: Payer: Medicare Other | Admitting: *Deleted

## 2017-08-01 DIAGNOSIS — N184 Chronic kidney disease, stage 4 (severe): Secondary | ICD-10-CM | POA: Diagnosis not present

## 2017-08-02 ENCOUNTER — Other Ambulatory Visit: Payer: Self-pay | Admitting: Cardiovascular Disease

## 2017-08-02 ENCOUNTER — Other Ambulatory Visit: Payer: Self-pay | Admitting: Family Medicine

## 2017-08-02 DIAGNOSIS — I25700 Atherosclerosis of coronary artery bypass graft(s), unspecified, with unstable angina pectoris: Secondary | ICD-10-CM

## 2017-08-02 LAB — BASIC METABOLIC PANEL
BUN/Creatinine Ratio: 11 (ref 10–24)
BUN: 24 mg/dL (ref 8–27)
CALCIUM: 8.9 mg/dL (ref 8.6–10.2)
CO2: 20 mmol/L (ref 20–29)
CREATININE: 2.25 mg/dL — AB (ref 0.76–1.27)
Chloride: 108 mmol/L — ABNORMAL HIGH (ref 96–106)
GFR calc Af Amer: 32 mL/min/{1.73_m2} — ABNORMAL LOW (ref >59)
GFR, EST NON AFRICAN AMERICAN: 28 mL/min/{1.73_m2} — AB (ref >59)
GLUCOSE: 104 mg/dL — AB (ref 65–99)
Potassium: 4.9 mmol/L (ref 3.5–5.2)
SODIUM: 144 mmol/L (ref 134–144)

## 2017-08-02 NOTE — Progress Notes (Signed)
Pts wife, Jeannene Patella, DPR on file, has been made aware of normal result and verbalized understanding.  jw 08/02/17

## 2017-08-05 ENCOUNTER — Ambulatory Visit (INDEPENDENT_AMBULATORY_CARE_PROVIDER_SITE_OTHER): Payer: Medicare Other | Admitting: Family Medicine

## 2017-08-05 ENCOUNTER — Encounter: Payer: Self-pay | Admitting: Family Medicine

## 2017-08-05 VITALS — BP 150/80 | HR 94 | Temp 98.0°F | Resp 16

## 2017-08-05 DIAGNOSIS — R58 Hemorrhage, not elsewhere classified: Secondary | ICD-10-CM

## 2017-08-05 NOTE — Patient Instructions (Signed)
Discussed use of cold compresses for the next 24 hours then apply warm compresses to help resolve the bruising.

## 2017-08-05 NOTE — Progress Notes (Signed)
Subjective:     Patient ID: Ronald [name], male   DOB: Sep 21, 1945, 72 y.o.   MRN: [id number] Chief Complaint  Patient presents with  . Bleeding/Bruising    Patient comes into office today with concerns of a bruise that was found 6days ago on patient left side. Patients wife reports that patient was hospitalized week prior and had a catheter placed, patient states that they believed he had a blockage in his artery. Patient wife states that bruise appeared a day after patient was discharged and states that bruise has become more dark in color.    HPI Had cardiac catherization on 07/28/17. Subsequently noticed a bruise on the left side of his abdomen. Denies injury but remains on two anti-platelet medication. Accompanied by his wife today.  Review of Systems     Objective:   Physical Exam  Constitutional: He appears well-developed and well-nourished. No distress.  Skin:  Right lateral abdomen with large dark area of ecchymosis. No underlying hematoma appreciated on palpation, minimally tender to the touch.       Assessment:    1. Ecchymosis: monitor to resolution.     Plan:   Discussed use of cold compresses for 24 hours then warm compresses.

## 2017-08-10 ENCOUNTER — Encounter: Payer: Self-pay | Admitting: Family Medicine

## 2017-08-10 ENCOUNTER — Ambulatory Visit (INDEPENDENT_AMBULATORY_CARE_PROVIDER_SITE_OTHER): Payer: Medicare Other | Admitting: Family Medicine

## 2017-08-10 VITALS — BP 112/62 | HR 62 | Temp 97.7°F | Resp 16

## 2017-08-10 DIAGNOSIS — I200 Unstable angina: Secondary | ICD-10-CM

## 2017-08-10 DIAGNOSIS — I25110 Atherosclerotic heart disease of native coronary artery with unstable angina pectoris: Secondary | ICD-10-CM | POA: Diagnosis not present

## 2017-08-10 DIAGNOSIS — E119 Type 2 diabetes mellitus without complications: Secondary | ICD-10-CM | POA: Diagnosis not present

## 2017-08-10 LAB — POCT GLYCOSYLATED HEMOGLOBIN (HGB A1C): Hemoglobin A1C: 6.2

## 2017-08-10 NOTE — Progress Notes (Signed)
Patient: Ronald [name]    DOB: 03/14/1946   72 y.o.   MRN: [id number] Visit Date: 08/10/2017  Today's Provider: [name], MD   Chief Complaint  Patient presents with  . Diabetes  . Hospitalization Follow-up   Subjective:    HPI  Diabetes Mellitus Type II, Follow-up:   Lab Results  Component Value Date   HGBA1C 6.3 05/26/2017   HGBA1C 5.9 (H) 01/13/2017   HGBA1C 6.0 09/07/2016    Last seen for diabetes 4 months ago.  Management since then includes none. He reports good compliance with treatment. He is not having side effects.  Home blood sugar records: not being checked    Pertinent Labs:    Component Value Date/Time   CHOL 138 01/13/2017 0914   TRIG 118 01/13/2017 0914   HDL 40 01/13/2017 0914   LDLCALC 74 01/13/2017 0914   CREATININE 2.25 (H) 08/01/2017 1518   CREATININE 2.44 (H) 11/24/2015 1546    Wt Readings from Last 3 Encounters:  07/29/17 230 lb 3.2 oz (104.4 kg)  07/20/17 234 lb (106.1 kg)  07/07/17 236 lb 6.4 oz (107.2 kg)    ------------------------------------------------------------------------  Pt was also in the hospital since last OV.( 07/27/17-07/29/17) He was having chest pain and shortness of breath but his heart checked out ok. He reports that he is feeling better but not quit himself. He is still having shortness of breath. They did a chest xray and a CT of abdomen and chest xray was ok. The Ct found a 9x9 nodular opacity in the inferior lingula. Consider one of the following in 3 months for both low-risk and high-risk individuals: (a) repeat chest CT, (b) follow-up PET-CT, or (c)tissue sampling.      Allergies  Allergen Reactions  . Predicort [Prednisolone] Other (See Comments)    Stomach pain  . Ciprofloxacin     GI upset  . Hydrochlorothiazide Other (See Comments)    Dehydration  . Hydrocodone     Stomach upset  . Hydrocodone-Acetaminophen     Stomach upset  . Hydrocodone-Acetaminophen Nausea Only  .  Loratadine Other (See Comments)    Other reaction(s): Unknown  . Sulfa Antibiotics Other (See Comments)    Cannot recall  . Sulfacetamide Sodium Other (See Comments)    Cannot recall  . Sulfasalazine     Other reaction(s): Other (See Comments) Cannot recall  . Hydrocodone-Acetaminophen Nausea Only  . Penicillins Hives and Rash    Has patient had a PCN reaction causing immediate rash, facial/tongue/throat swelling, SOB or lightheadedness with hypotension: YES Has patient had a PCN reaction causing severe rash involving mucus membranes or skin necrosis: NO Has patient had a PCN reaction that required hospitalization NO Has patient had a PCN reaction occurring within the last 10 years:NO If all of the above answers are "NO", then may proceed with Cephalosporin use.     Current Outpatient Medications:  .  allopurinol (ZYLOPRIM) 100 MG tablet, TAKE 1 TABLET BY MOUTH AT  BEDTIME, Disp: 90 tablet, Rfl: 3 .  amLODipine (NORVASC) 5 MG tablet, TAKE 1 TABLET BY MOUTH  DAILY (Patient taking differently: No sig reported), Disp: 90 tablet, Rfl: 3 .  aspirin 81 MG tablet, Take 81 mg by mouth every morning. , Disp: , Rfl:  .  atorvastatin (LIPITOR) 80 MG tablet, Take 1 tablet (80 mg total) by mouth every evening., Disp: 30 tablet, Rfl: 3 .  carvedilol (COREG) 25 MG tablet, Take 1 tablet (25  mg total) by mouth 2 (two) times daily., Disp: 180 tablet, Rfl: 3 .  clopidogrel (PLAVIX) 75 MG tablet, TAKE 1 TABLET BY MOUTH  DAILY, Disp: 90 tablet, Rfl: 2 .  furosemide (LASIX) 40 MG tablet, TAKE 1 TABLET BY MOUTH EVERY MORNING TAKE AS NEEDED FOR LEG SWELLING, Disp: , Rfl: 6 .  isosorbide mononitrate (IMDUR) 30 MG 24 hr tablet, Take 1 tablet (30 mg total) by mouth daily., Disp: 30 tablet, Rfl: 3 .  magnesium oxide (MAG-OX) 400 (241.3 Mg) MG tablet, TAKE 1 TABLET BY MOUTH TWO TIMES DAILY., Disp: 180 tablet, Rfl: 3 .  Multiple Vitamin (MULTIVITAMIN) tablet, Take 1 tablet by mouth every morning. , Disp: , Rfl:  .   nitroGLYCERIN (NITROSTAT) 0.4 MG SL tablet, Place 0.4 mg under the tongue every 5 (five) minutes as needed for chest pain (Up to 3 times)., Disp: , Rfl:  .  nystatin cream (MYCOSTATIN), Apply 1 application topically 2 (two) times daily as needed for dry skin., Disp: 30 g, Rfl: 0 .  pantoprazole (PROTONIX) 40 MG tablet, TAKE 1 TABLET BY MOUTH  DAILY, Disp: 90 tablet, Rfl: 3 .  ranitidine (ZANTAC) 150 MG tablet, TAKE 1 TABLET BY MOUTH AT  BEDTIME, Disp: 90 tablet, Rfl: 3 .  sodium bicarbonate 650 MG tablet, Take 650 mg by mouth 2 (two) times daily., Disp: , Rfl:   Review of Systems  Constitutional: Negative.   HENT: Negative.   Eyes: Negative.   Respiratory: Positive for shortness of breath.   Cardiovascular: Negative.   Gastrointestinal: Negative.   Endocrine: Negative.   Genitourinary: Negative.   Musculoskeletal: Negative.   Skin: Negative.   Allergic/Immunologic: Negative.   Neurological: Positive for light-headedness (when standing up sometimes).  Hematological: Negative.   Psychiatric/Behavioral: Negative.     Social History   Tobacco Use  . Smoking status: Former Smoker    Packs/day: 3.00    Years: 20.00    Pack years: 60.00    Types: Cigarettes    Last attempt to quit: 07/25/1986    Years since quitting: 31.0  . Smokeless tobacco: Never Used  Substance Use Topics  . Alcohol use: No    Alcohol/week: 0.0 oz   Objective:   BP 112/62 (BP Location: Right Arm, Patient Position: Sitting, Cuff Size: Large)   Pulse 62   Temp 97.7 F (36.5 C) (Oral)   Resp 16   SpO2 97%  Vitals:   08/10/17 1629  BP: 112/62  Pulse: 62  Resp: 16  Temp: 97.7 F (36.5 C)  TempSrc: Oral  SpO2: 97%     Physical Exam  Constitutional: He is oriented to person, place, and time. He appears well-developed and well-nourished.  HENT:  Head: Normocephalic and atraumatic.  Eyes: Conjunctivae are normal. No scleral icterus.  Neck: No thyromegaly present.  Cardiovascular: Normal rate, regular  rhythm and normal heart sounds.  Pulmonary/Chest: Effort normal and breath sounds normal.  Abdominal: Soft.  Trunk obesity  Neurological: He is alert and oriented to person, place, and time.  Skin: Skin is warm and dry.  Psychiatric: He has a normal mood and affect. His behavior is normal. Judgment and thought content normal.        Assessment & Plan:     1. Type 2 diabetes mellitus without complication, without long-term current use of insulin (HCC)  - POCT HgB A1C--6.2 today 2.CKD 3.CAD Significant on recent cath--aggressive medical management.  I have done the exam and reviewed the chart and it is accurate to  the best of my knowledge. Development worker, community has been used and  any errors in dictation or transcription are unintentional. Miguel Aschoff M.D. [place], MD  [facility]

## 2017-08-16 ENCOUNTER — Encounter: Payer: Self-pay | Admitting: Cardiology

## 2017-08-16 ENCOUNTER — Ambulatory Visit: Payer: Medicare Other | Admitting: Cardiology

## 2017-08-16 VITALS — BP 126/70 | HR 69 | Ht 70.0 in | Wt 236.0 lb

## 2017-08-16 DIAGNOSIS — I2510 Atherosclerotic heart disease of native coronary artery without angina pectoris: Secondary | ICD-10-CM

## 2017-08-16 NOTE — Progress Notes (Signed)
08/16/2017 [facility]   02-06-1946  [id number]  Primary Physician [name]., MD Primary Cardiologist: Dr. Angelena Form  Reason for Visit/CC: [name] f/u for CAD  HPI:  Ronald [name] is a 72 y.o. male who is being seen today for post hospital f/u after presentation for chest pain.   In summary, he is followed by Dr. Angelena Form and has a h/o CADs/p CABG in 1996 (LIMA-LAD, SVG-circumflex, SVG-RCA, SVG-diagonal) with redo bypass in 2007 (SVG-OM, SVG-RCA), subsequent PCIs (DES to SVG-OM 2016, DES to SVG-diagonal in 2017), ischemic cardiomyopathy/chronic combined CHF, CKD III-IV, DM 2, HTN, HLD, renal artery stenosis by CT 2008, AAA, COPD, GERD and PVCs. He has had a h/oflucutating LV dysfunction dating back to echo in 2006 when EF was 40-45%. 2D echo [id number] normal LV systolic function.He also has notable truncal obesity and is very sedentary, 2/2 chronic LBP.  He presented to the hospital on 07/27/17 with chest pain concerning for unstable angina. He ruled out for MI with negative cardiac enzymes.  Options were weighed and patient requested left heart catheterization for definitive assessment.  Given his chronic kidney disease he was hydrated with IV fluids prior to cath.  His baseline serum creatinine prior to cath was 2.2.  After precath hydration, serum creatinine improved to 1.9.  Left heart catheterization was performed by Dr. Claiborne Billings.  Cath showed significant CAD with patent LIMA to LAD, occluded SVG to distal RCA, patent "Y" graft to diag and OM and SVG. No clear interventional options. Acceleration of medical therapy was recommended so Imdur was added.  This resulted in significant improvement in symptoms.  He was also continued on his home amlodipine and beta-blocker.  He received additional post-cath hydration.  His creatinine day of discharge was 1.7.  He had a repeat outpatient BMP several days post discharge which showed his creatinine was back to his baseline of  2.2.  He presents to clinic today for post hospital follow-up.  He is here with his wife.  He notes significant improvement in symptoms.  He denies any recurrent chest pain.  He notes increased exercise tolerance.  He is tolerating Imdur well without any side effects.  Initially he had some slight headaches however this has resolved.  His blood pressure is stable at 126/70.  Heart rate 69 bpm.  He denies any post cath complications.  His femoral access site is stable.  Cardiac Procedures Procedures   LEFT HEART CATH AND CORS/GRAFTS ANGIOGRAPHY  Conclusion     Ost RCA to Prox RCA lesion is 95% stenosed.  Prox RCA lesion is 100% stenosed.  Prox Cx lesion is 100% stenosed.  Prox LAD lesion is 100% stenosed.  Origin lesion is 100% stenosed.  Origin lesion is 100% stenosed.  Previously placed Dist Graft to Insertion stent (unknown type) is widely patent.  Origin to Prox Graft lesion is 10% stenosed.  Prox Graft lesion is 30% stenosed.  Mid Graft to Dist Graft lesion is 30% stenosed.  Mid Graft lesion is 50% stenosed.   Significant native CAD with total occlusion of the LAD after the takeoff of the first diagonal vessel; total occlusion of the proximal left circumflex coronary artery; and total occlusion of the proximal RCA with antegrade bridging collaterals.  Patent LIMA to LAD.  Patent Y vein graft from the 1996 surgery which supplies the diagonal vessel and distal circumflex marginal vessel.  There is diffuse narrowing of 50% in the midportion of the Y graft supplying the diagonal vessel and  a patent distal stent extending to the ostium of the diagonal.  The Y graft supplying the distal marginal is free of significant disease and has mild luminal irregularity.  Occluded vein graft which had supplied the distal RCA from the 1996 surgery.  Patent SVG supplying the distal RCA from the 2007 surgery with a stent in the proximal portion of the graft with 30% narrowings in the  proximal and mid segment.  Occluded vein graft from 2007 which appears to also have been stented.  LVEDP 18 mm  RECOMMENDATION: Increase medical therapy.     Current Meds  Medication Sig  . allopurinol (ZYLOPRIM) 100 MG tablet TAKE 1 TABLET BY MOUTH AT  BEDTIME  . amLODipine (NORVASC) 5 MG tablet TAKE 1 TABLET BY MOUTH  DAILY (Patient taking differently: No sig reported)  . aspirin 81 MG tablet Take 81 mg by mouth every morning.   [name] atorvastatin (LIPITOR) 80 MG tablet Take 1 tablet (80 mg total) by mouth every evening.  . carvedilol (COREG) 25 MG tablet Take 1 tablet (25 mg total) by mouth 2 (two) times daily.  . clopidogrel (PLAVIX) 75 MG tablet TAKE 1 TABLET BY MOUTH  DAILY  . furosemide (LASIX) 40 MG tablet TAKE 1 TABLET BY MOUTH EVERY MORNING TAKE AS NEEDED FOR LEG SWELLING  . isosorbide mononitrate (IMDUR) 30 MG 24 hr tablet Take 1 tablet (30 mg total) by mouth daily.  . magnesium oxide (MAG-OX) 400 (241.3 Mg) MG tablet TAKE 1 TABLET BY MOUTH TWO TIMES DAILY.  . Multiple Vitamin (MULTIVITAMIN) tablet Take 1 tablet by mouth every morning.   . nitroGLYCERIN (NITROSTAT) 0.4 MG SL tablet Place 0.4 mg under the tongue every 5 (five) minutes as needed for chest pain (Up to 3 times).  . nystatin cream (MYCOSTATIN) Apply 1 application topically 2 (two) times daily as needed for dry skin.  . pantoprazole (PROTONIX) 40 MG tablet TAKE 1 TABLET BY MOUTH  DAILY  . ranitidine (ZANTAC) 150 MG tablet TAKE 1 TABLET BY MOUTH AT  BEDTIME  . sodium bicarbonate 650 MG tablet Take 650 mg by mouth 2 (two) times daily.   Allergies  Allergen Reactions  . Predicort [Prednisolone] Other (See Comments)    Stomach pain  . Ciprofloxacin     GI upset  . Hydrochlorothiazide Other (See Comments)    Dehydration  . Hydrocodone     Stomach upset  . Hydrocodone-Acetaminophen     Stomach upset  . Hydrocodone-Acetaminophen Nausea Only  . Loratadine Other (See Comments)    Other reaction(s): Unknown  .  Sulfa Antibiotics Other (See Comments)    Cannot recall  . Sulfacetamide Sodium Other (See Comments)    Cannot recall  . Sulfasalazine     Other reaction(s): Other (See Comments) Cannot recall  . Hydrocodone-Acetaminophen Nausea Only  . Penicillins Hives and Rash    Has patient had a PCN reaction causing immediate rash, facial/tongue/throat swelling, SOB or lightheadedness with hypotension: YES Has patient had a PCN reaction causing severe rash involving mucus membranes or skin necrosis: NO Has patient had a PCN reaction that required hospitalization NO Has patient had a PCN reaction occurring within the last 10 years:NO If all of the above answers are "NO", then may proceed with Cephalosporin use.   Past Medical History:  Diagnosis Date  . AAA (abdominal aortic aneurysm) ([place])    a. 3cm by [place] 2015.  [name] Arthritis    "hips; back" (12/13/2014)  . CAD (coronary artery disease) 2007  a. s/p CABG- IMA-LAD, VG-Cx, VG-RCA, VG-diag in 1999. B. sp redo CABG- VG-OM, VG-RCA in 2007 due to VG disease. c. NSTEMI 11/2014 s/p DES to SVG-OM from the Y graft.d. PTCA/DES x 1 distal body of SVG to Diagonal.09/2015  . Chronic combined systolic and diastolic CHF (congestive heart failure) ([place])    a. remote EF 40-45% in 2006. b. Normal EF 2014. b. Echo 07/2016 EF 45-50%, grade 1 DD.  [name] Chronic lower back pain   . CKD (chronic kidney disease), stage IV ([place])   . COPD (chronic obstructive pulmonary disease) ([place])   . Deafness in left ear   . Degenerative disc disease, lumbar   . Dilated cardiomyopathy ([place]) 10/07/2015  . Emphysema   . Esophageal stricture 07/02/1998   EGD  . GERD (gastroesophageal reflux disease)   . History of gout    "last flareup was in 2007" (12/13/2014)  . History of hiatal hernia   . Hyperlipidemia   . Hypertension   . Ischemic cardiomyopathy 2006   EF 40% to 50% by 2D echo in 2006;  Echo 12/31/12: Mild LVH, EF 50-55%, normal wall motion.   [name] PVC's (premature ventricular contractions)    . Renal artery stenosis ([place])    a. noted on CT 2008.  . Type II diabetes mellitus ([place])    Diet control   . Walking pneumonia 1990's   Family History  Problem Relation Age of Onset  . Heart attack Mother        MI  . Stroke Mother   . Heart disease Mother   . Hypertension Mother   . Hyperlipidemia Mother   . Heart disease Father   . Rheumatic fever Father   . Colon cancer Neg Hx    Past Surgical History:  Procedure Laterality Date  . CARDIAC CATHETERIZATION  "several"  . CARDIAC CATHETERIZATION N/A 12/13/2014   Procedure: Left Heart Cath and Coronary Angiography;  Surgeon: Jettie Booze, MD;  Location: [place] [facility];  Service: Cardiovascular;  Laterality: N/A;  . CARDIAC CATHETERIZATION  12/13/2014   Procedure: Coronary Stent Intervention;  Surgeon: Jettie Booze, MD;  Location: [place] [facility];  Service: Cardiovascular;;  . CARDIAC CATHETERIZATION N/A 10/07/2015   Procedure: Left Heart Cath and Cors/Grafts Angiography;  Surgeon: Burnell Blanks, MD;  Location: [place] [facility];  Service: Cardiovascular;  Laterality: N/A;  . CARDIAC CATHETERIZATION N/A 10/07/2015   Procedure: Coronary Stent Intervention;  Surgeon: Burnell Blanks, MD;  Location: [place] [facility];  Service: Cardiovascular;  Laterality: N/A;  . CORONARY ANGIOPLASTY  "several"  . CORONARY ANGIOPLASTY WITH STENT PLACEMENT  2005; 12/13/2014   "2; 1"  . CORONARY ARTERY BYPASS GRAFT  1996   CABG X5  . CORONARY ARTERY BYPASS GRAFT  March 2007   CABG X3  . ESOPHAGOGASTRODUODENOSCOPY (EGD) WITH ESOPHAGEAL DILATION  2000  . GREEN LIGHT LASER TURP (TRANSURETHRAL RESECTION OF PROSTATE  2000's   "not cancerous"  . HERNIA REPAIR    . LAPAROSCOPIC CHOLECYSTECTOMY    . LEFT HEART CATH AND CORS/GRAFTS ANGIOGRAPHY N/A 07/28/2017   Procedure: LEFT HEART CATH AND CORS/GRAFTS ANGIOGRAPHY;  Surgeon: Troy Sine, MD;  Location: [place] [facility];  Service: Cardiovascular;  Laterality: N/A;  .  LUNG SURGERY  1996   "S/P CABG, had to put staple in lung after it had collapsed"  . UMBILICAL HERNIA REPAIR     w/chole   Social History   Socioeconomic History  . Marital status: Married  Spouse name: Not on file  . Number of children: 2  . Years of education: Not on file  . Highest education level: Not on file  Social Needs  . Financial resource strain: Not on file  . Food insecurity - worry: Not on file  . Food insecurity - inability: Not on file  . Transportation needs - medical: Not on file  . Transportation needs - non-medical: Not on file  Occupational History  . Occupation: Retired  Tobacco Use  . Smoking status: Former Smoker    Packs/day: 3.00    Years: 20.00    Pack years: 60.00    Types: Cigarettes    Last attempt to quit: 07/25/1986    Years since quitting: 31.0  . Smokeless tobacco: Never Used  Substance and Sexual Activity  . Alcohol use: No    Alcohol/week: 0.0 oz  . Drug use: No  . Sexual activity: Not Currently  Other Topics Concern  . Not on file  Social History Narrative   Did auto salvage work.     Review of Systems: General: negative for chills, fever, night sweats or weight changes.  Cardiovascular: negative for chest pain, dyspnea on exertion, edema, orthopnea, palpitations, paroxysmal nocturnal dyspnea or shortness of breath Dermatological: negative for rash Respiratory: negative for cough or wheezing Urologic: negative for hematuria Abdominal: negative for nausea, vomiting, diarrhea, bright red blood per rectum, melena, or hematemesis Neurologic: negative for visual changes, syncope, or dizziness All other systems reviewed and are otherwise negative except as noted above.   Physical Exam:  Blood pressure 126/70, pulse 69, height 5\' 10"  (1.778 m), weight 236 lb (107 kg), SpO2 96 %.  General appearance: alert, cooperative, no distress and moderately obese Neck: no carotid bruit and no JVD Lungs: clear to auscultation  bilaterally Heart: regular rate and rhythm, S1, S2 normal, no murmur, click, rub or gallop Extremities: extremities normal, atraumatic, no cyanosis or edema Pulses: 2+ and symmetric Skin: Skin color, texture, turgor normal. No rashes or lesions Neurologic: Grossly normal  EKG not performed -- personally reviewed   ASSESSMENT AND PLAN:   1. CAD: s/p CABG in 1996 (LIMA-LAD, SVG-circumflex, SVG-RCA, SVG-diagonal) with redo bypass in 2007 (SVG-OM, SVG-RCA), subsequent PCIs (DES to SVG-OM 2016, DES to SVG-diagonal in 2017). Recent LHC 07/28/17 showed ignificant CAD with patent LIMA to LAD, occluded SVG to distal RCA, patent "Y" graft to diag and OM and SVG. No clear interventional options. Acceleration of medical therapy was recommended. Imdur added. Significant improvement in symptoms. No recurrent CP. Increased exercise tolerance.  Continue aspirin, Plavix, beta-blocker, long-acting nitrate, calcium channel blocker and statin.  2.  Hypertension: Controlled on current regimen.  3.  Diastolic heart failure: Stable.  No dyspnea.  Continue medical therapy.  4.  Chronic kidney disease:  renal function stable post cath.  He had a follow-up basic metabolic panel post cath which showed creatinine at baseline at 2.2.  5. AAA:   stable by recent US 07/11/17.  The largest aortic measurement is 2.8 cm. The largest aortic diameter remains essentially unchanged compared to prior exam. Previous diameter measurement was 3.0 cm obtained on 02/2014. Continue to follow yearly. Keep BP controlled. Continue on BB. Pt denies tobacco use.   6.  Chronic low back pain: Patient reports that he is considering lumbar injections for his chronic low back pain and he is asking for clearance.  Given that he is on chronic Plavix therapy, I will discuss this with his primary cardiologist Dr. Angelena Form to see  if okay for him to hold Plavix 5-7 days prior to injection.  Follow-Up w/ Dr. Angelena Form in 3 months.   Sayla Golonka Ladoris Gene, MHS [facility] 08/16/2017 3:30 PM

## 2017-08-16 NOTE — Patient Instructions (Addendum)
Medication Instructions:   Your physician recommends that you continue on your current medications as directed. Please refer to the Current Medication list given to you today.   If you need a refill on your cardiac medications before your next appointment, please call your pharmacy.  Labwork: NONE ORDERED  TODAY    Testing/Procedures: NONE ORDERED  TODAY    Follow-Up: [facility] 3 MONTHS    Any Other Special Instructions Will Be Listed Below (If Applicable).

## 2017-08-22 ENCOUNTER — Telehealth: Payer: Self-pay | Admitting: *Deleted

## 2017-08-22 NOTE — Telephone Encounter (Signed)
-----   Message from Burnell Blanks, MD sent at 08/21/2017  9:56 AM EDT ----- He can hold his Plavix for his injection. Pat, Can we let him know? Thanks, chris  ----- Message ----- From: Consuelo Pandy, PA-C Sent: 08/16/2017   4:06 PM To: Thompson Grayer, RN, #  Would it be ok for pt to hold Plavix for 5-7 days prior to spinal injection for LBP?

## 2017-08-22 NOTE — Telephone Encounter (Signed)
Pt's wife notified.

## 2017-09-02 ENCOUNTER — Other Ambulatory Visit: Payer: Self-pay | Admitting: Family Medicine

## 2017-09-05 ENCOUNTER — Other Ambulatory Visit: Payer: Self-pay | Admitting: Cardiovascular Disease

## 2017-09-05 MED ORDER — ISOSORBIDE MONONITRATE ER 30 MG PO TB24: 30.0000 mg | ORAL_TABLET | Freq: Every day | ORAL | 3 refills | Status: DC

## 2017-09-26 ENCOUNTER — Ambulatory Visit: Payer: Self-pay | Admitting: Family Medicine

## 2017-10-03 DIAGNOSIS — R600 Localized edema: Secondary | ICD-10-CM | POA: Diagnosis not present

## 2017-10-03 DIAGNOSIS — N183 Chronic kidney disease, stage 3 (moderate): Secondary | ICD-10-CM | POA: Diagnosis not present

## 2017-10-03 DIAGNOSIS — I10 Essential (primary) hypertension: Secondary | ICD-10-CM | POA: Diagnosis not present

## 2017-10-03 DIAGNOSIS — I701 Atherosclerosis of renal artery: Secondary | ICD-10-CM | POA: Diagnosis not present

## 2017-10-03 DIAGNOSIS — E872 Acidosis: Secondary | ICD-10-CM | POA: Diagnosis not present

## 2017-10-07 DIAGNOSIS — M62830 Muscle spasm of back: Secondary | ICD-10-CM | POA: Diagnosis not present

## 2017-10-07 DIAGNOSIS — M5136 Other intervertebral disc degeneration, lumbar region: Secondary | ICD-10-CM | POA: Diagnosis not present

## 2017-10-07 DIAGNOSIS — M48062 Spinal stenosis, lumbar region with neurogenic claudication: Secondary | ICD-10-CM | POA: Diagnosis not present

## 2017-10-07 DIAGNOSIS — N184 Chronic kidney disease, stage 4 (severe): Secondary | ICD-10-CM | POA: Diagnosis not present

## 2017-10-07 DIAGNOSIS — R600 Localized edema: Secondary | ICD-10-CM | POA: Diagnosis not present

## 2017-10-07 DIAGNOSIS — I701 Atherosclerosis of renal artery: Secondary | ICD-10-CM | POA: Diagnosis not present

## 2017-10-07 DIAGNOSIS — I10 Essential (primary) hypertension: Secondary | ICD-10-CM | POA: Diagnosis not present

## 2017-10-07 DIAGNOSIS — M5416 Radiculopathy, lumbar region: Secondary | ICD-10-CM | POA: Diagnosis not present

## 2017-10-19 ENCOUNTER — Other Ambulatory Visit: Payer: Self-pay | Admitting: Physician Assistant

## 2017-11-03 DIAGNOSIS — M5136 Other intervertebral disc degeneration, lumbar region: Secondary | ICD-10-CM | POA: Diagnosis not present

## 2017-11-03 DIAGNOSIS — M48062 Spinal stenosis, lumbar region with neurogenic claudication: Secondary | ICD-10-CM | POA: Diagnosis not present

## 2017-11-03 DIAGNOSIS — M5416 Radiculopathy, lumbar region: Secondary | ICD-10-CM | POA: Diagnosis not present

## 2017-11-11 ENCOUNTER — Other Ambulatory Visit: Payer: Self-pay | Admitting: Physician Assistant

## 2017-11-17 ENCOUNTER — Ambulatory Visit: Payer: Self-pay | Admitting: Family Medicine

## 2017-11-21 ENCOUNTER — Ambulatory Visit (INDEPENDENT_AMBULATORY_CARE_PROVIDER_SITE_OTHER): Payer: Medicare Other | Admitting: Family Medicine

## 2017-11-21 ENCOUNTER — Encounter: Payer: Self-pay | Admitting: Family Medicine

## 2017-11-21 VITALS — BP 136/68 | HR 60 | Temp 98.6°F | Resp 16 | Wt 255.0 lb

## 2017-11-21 DIAGNOSIS — M791 Myalgia, unspecified site: Secondary | ICD-10-CM

## 2017-11-21 DIAGNOSIS — E782 Mixed hyperlipidemia: Secondary | ICD-10-CM

## 2017-11-21 DIAGNOSIS — I119 Hypertensive heart disease without heart failure: Secondary | ICD-10-CM | POA: Diagnosis not present

## 2017-11-21 DIAGNOSIS — E1121 Type 2 diabetes mellitus with diabetic nephropathy: Secondary | ICD-10-CM | POA: Diagnosis not present

## 2017-11-21 NOTE — Progress Notes (Signed)
Ronald [name] [facility]  MRN: [id number] DOB: 05-24-1946  Subjective:  HPI   The patient is a 72 year old male who presents for follow up of chronic health.  He was last seen on 08/10/17.   Diabetes-His last A1C was on 08/10/17 and it was 6.2  He is not checking his glucose at home.  Hypertension- His blood pressure was stable on his last check. However readings from the same month revealed some elevated reading.  BP Readings from Last 3 Encounters:  11/21/17 136/68  08/16/17 126/70  08/10/17 112/62   Hyperlipidemia-It is just about time to check his cholesterol. His readings have stable on previous checks. Legs sche all of the time--wakes him up at night. Lab Results  Component Value Date   CHOL 138 01/13/2017   CHOL 112 11/04/2015   CHOL 135 12/13/2014   Lab Results  Component Value Date   HDL 40 01/13/2017   HDL 32 (L) 11/04/2015   HDL 30 (L) 12/13/2014   Lab Results  Component Value Date   LDLCALC 74 01/13/2017   LDLCALC 58 11/04/2015   LDLCALC 80 12/13/2014   Lab Results  Component Value Date   TRIG 118 01/13/2017   TRIG 112 11/04/2015   TRIG 124 12/13/2014   Lab Results  Component Value Date   CHOLHDL 4.5 12/13/2014   No results found for: LDLDIRECT  Patient Active Problem List   Diagnosis Date Noted  . CKD (chronic kidney disease), stage IV ([place]) 07/29/2017  . Obesity 07/29/2017  . Unstable angina ([place]) 07/27/2017  . Atherosclerosis of native arteries of extremity with intermittent claudication ([place]) 06/25/2016  . Acute renal failure superimposed on stage 3 chronic kidney disease ([place]) 10/07/2015  . Dilated cardiomyopathy ([place]) 10/07/2015  . Coronary artery disease involving native coronary artery of native heart with unstable angina pectoris ([place])   . Allergic rhinitis 08/19/2015  . NSTEMI (non-ST elevated myocardial infarction) ([place]) 12/13/2014  . AA (aortic aneurysm) ([place]) 10/04/2014  . Benign enlargement of prostate 10/04/2014  . Arteriosclerosis of  coronary artery 10/04/2014  . Diabetes mellitus, type 2 ([place]) 10/04/2014  . Acid reflux 10/04/2014  . 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 HLD (hyperlipidemia) 10/04/2014  . BP (high blood pressure) 10/04/2014  . Osteoarthrosis 10/04/2014  . Adult BMI 30+ 10/04/2014  . 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 Urge incontinence of urine 10/24/2012  . 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 Abdominal pain 11/09/2010  . Shortness of breath 11/12/2009  . Renal artery stenosis    . Mixed hyperlipidemia 09/04/2008  . History of redo bypass grafting 09/04/2008  . Diabetes mellitus with nephropathy [facility])     Past Medical History:  Diagnosis Date  . AAA (abdominal aortic aneurysm) ([place])    a. 3cm by [place] 2015.  [name] Arthritis    "hips; back" (12/13/2014)  . CAD (coronary artery disease) 2007   a. s/p CABG- IMA-LAD, VG-Cx, VG-RCA, VG-diag in 1999. B. sp redo CABG- VG-OM, VG-RCA in 2007 due to VG disease. c. NSTEMI 11/2014 s/p DES to SVG-OM from the Y graft.d. PTCA/DES x 1 distal body of SVG to Diagonal.09/2015  . Chronic combined systolic and diastolic CHF (congestive heart failure) ([place])    a. remote EF 40-45% in 2006. b. Normal EF 2014. b. Echo 07/2016 EF 45-50%, grade 1 DD.  [name] Chronic lower back pain   . CKD (chronic kidney disease), stage IV ([place])   . COPD (chronic obstructive  pulmonary disease) ([place])   . Deafness in left ear   . Degenerative disc disease, lumbar   . Dilated cardiomyopathy ([place]) 10/07/2015  . Emphysema   . Esophageal stricture 07/02/1998   EGD  . GERD (gastroesophageal reflux disease)   . History of gout    "last flareup was in 2007" (12/13/2014)  . History of hiatal hernia   . Hyperlipidemia   . Hypertension   . Ischemic cardiomyopathy 2006   EF 40% to 50% by 2D echo in 2006;  Echo 12/31/12: Mild LVH, EF 50-55%, normal wall motion.   [name] PVC's (premature ventricular contractions)   . Renal artery stenosis  ([place])    a. noted on CT 2008.  . Type II diabetes mellitus ([place])    Diet control   . Walking pneumonia 1990's    Social History   Socioeconomic History  . Marital status: Married    Spouse name: Not on file  . Number of children: 2  . Years of education: Not on file  . Highest education level: Not on file  Occupational History  . Occupation: Retired  Scientific laboratory technician  . Financial resource strain: Not on file  . Food insecurity:    Worry: Not on file    Inability: Not on file  . Transportation needs:    Medical: Not on file    Non-medical: Not on file  Tobacco Use  . Smoking status: Former Smoker    Packs/day: 3.00    Years: 20.00    Pack years: 60.00    Types: Cigarettes    Last attempt to quit: 07/25/1986    Years since quitting: 31.3  . Smokeless tobacco: Never Used  Substance and Sexual Activity  . Alcohol use: No    Alcohol/week: 0.0 oz  . Drug use: No  . Sexual activity: Not Currently  Lifestyle  . Physical activity:    Days per week: Not on file    Minutes per session: Not on file  . Stress: Not on file  Relationships  . Social connections:    Talks on phone: Not on file    Gets together: Not on file    Attends religious service: Not on file    Active member of club or organization: Not on file    Attends meetings of clubs or organizations: Not on file    Relationship status: Not on file  . Intimate partner violence:    Fear of current or ex partner: Not on file    Emotionally abused: Not on file    Physically abused: Not on file    Forced sexual activity: Not on file  Other Topics Concern  . Not on file  Social History Narrative   Did auto salvage work.    Outpatient Encounter Medications as of 11/21/2017  Medication Sig  . allopurinol (ZYLOPRIM) 100 MG tablet TAKE 1 TABLET BY MOUTH AT  BEDTIME  . amLODipine (NORVASC) 5 MG tablet TAKE 1 TABLET BY MOUTH  DAILY  . aspirin 81 MG tablet Take 81 mg by mouth every morning.   [name] atorvastatin (LIPITOR) 80  MG tablet TAKE 1 TABLET (80 MG TOTAL) BY MOUTH EVERY EVENING.  [name] clopidogrel (PLAVIX) 75 MG tablet TAKE 1 TABLET BY MOUTH  DAILY  . furosemide (LASIX) 40 MG tablet TAKE 1 TABLET BY MOUTH EVERY MORNING TAKE AS NEEDED FOR LEG SWELLING  . isosorbide mononitrate (IMDUR) 30 MG 24 hr tablet Take 1 tablet (30 mg total) by mouth daily.  . isosorbide mononitrate (  IMDUR) 30 MG 24 hr tablet TAKE 1 TABLET BY MOUTH EVERY DAY  . magnesium oxide (MAG-OX) 400 (241.3 Mg) MG tablet TAKE 1 TABLET BY MOUTH TWO TIMES DAILY.  . Multiple Vitamin (MULTIVITAMIN) tablet Take 1 tablet by mouth every morning.   . nitroGLYCERIN (NITROSTAT) 0.4 MG SL tablet Place 0.4 mg under the tongue every 5 (five) minutes as needed for chest pain (Up to 3 times).  . nystatin cream (MYCOSTATIN) Apply 1 application topically 2 (two) times daily as needed for dry skin.  . carvedilol (COREG) 25 MG tablet Take 1 tablet (25 mg total) by mouth 2 (two) times daily.  . pantoprazole (PROTONIX) 40 MG tablet TAKE 1 TABLET BY MOUTH  DAILY  . ranitidine (ZANTAC) 150 MG tablet TAKE 1 TABLET BY MOUTH AT  BEDTIME  . sodium bicarbonate 650 MG tablet Take 650 mg by mouth 2 (two) times daily.   No facility-administered encounter medications on file as of 11/21/2017.     Allergies  Allergen Reactions  . Predicort [Prednisolone] Other (See Comments)    Stomach pain  . Ciprofloxacin     GI upset  . Hydrochlorothiazide Other (See Comments)    Dehydration  . Hydrocodone     Stomach upset  . Hydrocodone-Acetaminophen     Stomach upset  . Hydrocodone-Acetaminophen Nausea Only  . Loratadine Other (See Comments)    Other reaction(s): Unknown  . Sulfa Antibiotics Other (See Comments)    Cannot recall  . Sulfacetamide Sodium Other (See Comments)    Cannot recall  . Sulfasalazine     Other reaction(s): Other (See Comments) Cannot recall  . Hydrocodone-Acetaminophen Nausea Only  . Penicillins Hives and Rash    Has patient had a PCN reaction causing  immediate rash, facial/tongue/throat swelling, SOB or lightheadedness with hypotension: YES Has patient had a PCN reaction causing severe rash involving mucus membranes or skin necrosis: NO Has patient had a PCN reaction that required hospitalization NO Has patient had a PCN reaction occurring within the last 10 years:NO If all of the above answers are "NO", then may proceed with Cephalosporin use.    Review of Systems  Constitutional: Negative for chills, diaphoresis, fever, malaise/fatigue and weight loss.  HENT: Negative.   Eyes: Negative.   Respiratory: Negative.   Cardiovascular: Negative for chest pain and leg swelling.  Gastrointestinal: Negative.   Musculoskeletal: Positive for myalgias.  Skin: Negative for itching and rash.  Neurological: Negative.   Endo/Heme/Allergies: Does not bruise/bleed easily.  Psychiatric/Behavioral: Negative.     Objective:  BP 136/68 (BP Location: Right Arm, Patient Position: Sitting, Cuff Size: Large)   Pulse 60   Temp 98.6 F (37 C)   Resp 16   Wt 255 lb (115.7 kg)   SpO2 98%   BMI 36.59 kg/m   Physical Exam  Constitutional: He is oriented to person, place, and time and well-developed, well-nourished, and in no distress.  HENT:  Head: Normocephalic and atraumatic.  Right Ear: External ear normal.  Left Ear: External ear normal.  Nose: Nose normal.  Mouth/Throat: Oropharynx is clear and moist.  Eyes: Conjunctivae are normal. No scleral icterus.  Neck: No thyromegaly present.  Cardiovascular: Normal rate, regular rhythm, normal heart sounds and intact distal pulses.  Pulmonary/Chest: Effort normal and breath sounds normal.  Abdominal: Soft.  Neurological: He is alert and oriented to person, place, and time. Gait normal. GCS score is 15.  Skin: Skin is warm and dry.  Psychiatric: Mood, memory, affect and judgment normal.  Assessment and Plan :  1. Hypertensive heart disease without CHF  - CBC with Differential/Platelet -  Comprehensive metabolic panel - TSH  2. Diabetes mellitus with nephropathy (HCC)  - Hemoglobin A1c - Comprehensive metabolic panel  3. Mixed hyperlipidemia  - Lipid Panel With LDL/HDL Ratio 4.Myalgia Try mustard,already tried mag ox. No benefit when he stopped statin. Would try stopping Lipitor for 2 weeks. 5.CKD  I have done the exam and reviewed the chart and it is accurate to the best of my knowledge. Development worker, community has been used and  any errors in dictation or transcription are unintentional. Miguel Aschoff M.D. [facility]

## 2017-11-22 DIAGNOSIS — E782 Mixed hyperlipidemia: Secondary | ICD-10-CM | POA: Diagnosis not present

## 2017-11-22 DIAGNOSIS — E1121 Type 2 diabetes mellitus with diabetic nephropathy: Secondary | ICD-10-CM | POA: Diagnosis not present

## 2017-11-22 DIAGNOSIS — I119 Hypertensive heart disease without heart failure: Secondary | ICD-10-CM | POA: Diagnosis not present

## 2017-11-23 LAB — CBC WITH DIFFERENTIAL/PLATELET
BASOS: 1 %
Basophils Absolute: 0 10*3/uL (ref 0.0–0.2)
EOS (ABSOLUTE): 0.2 10*3/uL (ref 0.0–0.4)
Eos: 3 %
HEMATOCRIT: 38.7 % (ref 37.5–51.0)
HEMOGLOBIN: 12.7 g/dL — AB (ref 13.0–17.7)
IMMATURE GRANULOCYTES: 0 %
Immature Grans (Abs): 0 10*3/uL (ref 0.0–0.1)
LYMPHS: 20 %
Lymphocytes Absolute: 1.1 10*3/uL (ref 0.7–3.1)
MCH: 28.8 pg (ref 26.6–33.0)
MCHC: 32.8 g/dL (ref 31.5–35.7)
MCV: 88 fL (ref 79–97)
Monocytes Absolute: 0.8 10*3/uL (ref 0.1–0.9)
Monocytes: 14 %
NEUTROS ABS: 3.3 10*3/uL (ref 1.4–7.0)
Neutrophils: 62 %
Platelets: 133 10*3/uL — ABNORMAL LOW (ref 150–450)
RBC: 4.41 x10E6/uL (ref 4.14–5.80)
RDW: 16.4 % — ABNORMAL HIGH (ref 12.3–15.4)
WBC: 5.5 10*3/uL (ref 3.4–10.8)

## 2017-11-23 LAB — LIPID PANEL WITH LDL/HDL RATIO
Cholesterol, Total: 115 mg/dL (ref 100–199)
HDL: 45 mg/dL (ref >39)
LDL Calculated: 56 mg/dL (ref 0–99)
LDl/HDL Ratio: 1.2 ratio (ref 0.0–3.6)
TRIGLYCERIDES: 68 mg/dL (ref 0–149)
VLDL CHOLESTEROL CAL: 14 mg/dL (ref 5–40)

## 2017-11-23 LAB — COMPREHENSIVE METABOLIC PANEL
ALT: 16 IU/L (ref 0–44)
AST: 15 IU/L (ref 0–40)
Albumin/Globulin Ratio: 2 (ref 1.2–2.2)
Albumin: 3.8 g/dL (ref 3.5–4.8)
Alkaline Phosphatase: 142 IU/L — ABNORMAL HIGH (ref 39–117)
BILIRUBIN TOTAL: 1.1 mg/dL (ref 0.0–1.2)
BUN/Creatinine Ratio: 17 (ref 10–24)
BUN: 38 mg/dL — AB (ref 8–27)
CALCIUM: 9.3 mg/dL (ref 8.6–10.2)
CHLORIDE: 109 mmol/L — AB (ref 96–106)
CO2: 21 mmol/L (ref 20–29)
Creatinine, Ser: 2.3 mg/dL — ABNORMAL HIGH (ref 0.76–1.27)
GFR, EST AFRICAN AMERICAN: 31 mL/min/{1.73_m2} — AB (ref >59)
GFR, EST NON AFRICAN AMERICAN: 27 mL/min/{1.73_m2} — AB (ref >59)
GLUCOSE: 118 mg/dL — AB (ref 65–99)
Globulin, Total: 1.9 g/dL (ref 1.5–4.5)
Potassium: 4.9 mmol/L (ref 3.5–5.2)
Sodium: 141 mmol/L (ref 134–144)
TOTAL PROTEIN: 5.7 g/dL — AB (ref 6.0–8.5)

## 2017-11-23 LAB — HEMOGLOBIN A1C
Est. average glucose Bld gHb Est-mCnc: 123 mg/dL
Hgb A1c MFr Bld: 5.9 % — ABNORMAL HIGH (ref 4.8–5.6)

## 2017-11-23 LAB — TSH: TSH: 2.58 u[IU]/mL (ref 0.450–4.500)

## 2017-11-24 DIAGNOSIS — E119 Type 2 diabetes mellitus without complications: Secondary | ICD-10-CM | POA: Diagnosis not present

## 2017-11-24 LAB — HM DIABETES EYE EXAM

## 2017-12-08 DIAGNOSIS — M5136 Other intervertebral disc degeneration, lumbar region: Secondary | ICD-10-CM | POA: Diagnosis not present

## 2017-12-08 DIAGNOSIS — M5416 Radiculopathy, lumbar region: Secondary | ICD-10-CM | POA: Diagnosis not present

## 2017-12-08 DIAGNOSIS — M48062 Spinal stenosis, lumbar region with neurogenic claudication: Secondary | ICD-10-CM | POA: Diagnosis not present

## 2017-12-19 ENCOUNTER — Ambulatory Visit: Payer: Medicare Other | Admitting: Family Medicine

## 2017-12-26 ENCOUNTER — Ambulatory Visit (INDEPENDENT_AMBULATORY_CARE_PROVIDER_SITE_OTHER): Payer: Medicare Other | Admitting: Family Medicine

## 2017-12-26 VITALS — BP 166/67 | HR 70 | Temp 98.2°F | Resp 16 | Wt 230.0 lb

## 2017-12-26 DIAGNOSIS — M791 Myalgia, unspecified site: Secondary | ICD-10-CM

## 2017-12-26 DIAGNOSIS — I25110 Atherosclerotic heart disease of native coronary artery with unstable angina pectoris: Secondary | ICD-10-CM | POA: Diagnosis not present

## 2017-12-26 DIAGNOSIS — M7751 Other enthesopathy of right foot: Secondary | ICD-10-CM

## 2017-12-26 DIAGNOSIS — N184 Chronic kidney disease, stage 4 (severe): Secondary | ICD-10-CM | POA: Diagnosis not present

## 2017-12-26 NOTE — Progress Notes (Signed)
Ronald [name] [facility]  MRN: [id number] DOB: 03-20-1946  Subjective:  HPI   The patient is a 72 year old male who presents for follow up of myalgias.  [place] as last seen on 11/21/17.  He was taken off of his statin but saw no change in symptoms.  He was instructed to try mustard since he had already tried the Mag Ox.   However in talking with the patient and his wife he never actually stopped his statin.  He is still has muscle pain and states he can't take the mustard.   Patient Active Problem List   Diagnosis Date Noted  . CKD (chronic kidney disease), stage IV ([place]) 07/29/2017  . Obesity 07/29/2017  . Unstable angina ([place]) 07/27/2017  . Atherosclerosis of native arteries of extremity with intermittent claudication ([place]) 06/25/2016  . Acute renal failure superimposed on stage 3 chronic kidney disease ([place]) 10/07/2015  . Dilated cardiomyopathy ([place]) 10/07/2015  . Coronary artery disease involving native coronary artery of native heart with unstable angina pectoris ([place])   . Allergic rhinitis 08/19/2015  . NSTEMI (non-ST elevated myocardial infarction) ([place]) 12/13/2014  . AA (aortic aneurysm) ([place]) 10/04/2014  . Benign enlargement of prostate 10/04/2014  . Arteriosclerosis of coronary artery 10/04/2014  . Diabetes mellitus, type 2 ([place]) 10/04/2014  . Acid reflux 10/04/2014  . 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 HLD (hyperlipidemia) 10/04/2014  . BP (high blood pressure) 10/04/2014  . Osteoarthrosis 10/04/2014  . Adult BMI 30+ 10/04/2014  . 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 Urge incontinence of urine 10/24/2012  . 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 Abdominal pain 11/09/2010  . Shortness of breath 11/12/2009  . Renal artery stenosis    . Mixed hyperlipidemia 09/04/2008  . History of redo bypass grafting 09/04/2008  . Diabetes mellitus with nephropathy [facility])     Past Medical History:  Diagnosis Date    . AAA (abdominal aortic aneurysm) ([place])    a. 3cm by [place] 2015.  [name] Arthritis    "hips; back" (12/13/2014)  . CAD (coronary artery disease) 2007   a. s/p CABG- IMA-LAD, VG-Cx, VG-RCA, VG-diag in 1999. B. sp redo CABG- VG-OM, VG-RCA in 2007 due to VG disease. c. NSTEMI 11/2014 s/p DES to SVG-OM from the Y graft.d. PTCA/DES x 1 distal body of SVG to Diagonal.09/2015  . Chronic combined systolic and diastolic CHF (congestive heart failure) ([place])    a. remote EF 40-45% in 2006. b. Normal EF 2014. b. Echo 07/2016 EF 45-50%, grade 1 DD.  [name] Chronic lower back pain   . CKD (chronic kidney disease), stage IV ([place])   . COPD (chronic obstructive pulmonary disease) ([place])   . Deafness in left ear   . Degenerative disc disease, lumbar   . Dilated cardiomyopathy ([place]) 10/07/2015  . Emphysema   . Esophageal stricture 07/02/1998   EGD  . GERD (gastroesophageal reflux disease)   . History of gout    "last flareup was in 2007" (12/13/2014)  . History of hiatal hernia   . Hyperlipidemia   . Hypertension   . Ischemic cardiomyopathy 2006   EF 40% to 50% by 2D echo in 2006;  Echo 12/31/12: Mild LVH, EF 50-55%, normal wall motion.   [name] PVC's (premature ventricular contractions)   . Renal artery stenosis ([place])    a. noted on CT 2008.  . Type II diabetes mellitus ([place])    Diet control   . Walking pneumonia  4235'T    Social History   Socioeconomic History  . Marital status: Married    Spouse name: Not on file  . Number of children: 2  . Years of education: Not on file  . Highest education level: Not on file  Occupational History  . Occupation: Retired  Scientific laboratory technician  . Financial resource strain: Not on file  . Food insecurity:    Worry: Not on file    Inability: Not on file  . Transportation needs:    Medical: Not on file    Non-medical: Not on file  Tobacco Use  . Smoking status: Former Smoker    Packs/day: 3.00    Years: 20.00    Pack years: 60.00    Types: Cigarettes    Last attempt to quit:  07/25/1986    Years since quitting: 31.4  . Smokeless tobacco: Never Used  Substance and Sexual Activity  . Alcohol use: No    Alcohol/week: 0.0 oz  . Drug use: No  . Sexual activity: Not Currently  Lifestyle  . Physical activity:    Days per week: Not on file    Minutes per session: Not on file  . Stress: Not on file  Relationships  . Social connections:    Talks on phone: Not on file    Gets together: Not on file    Attends religious service: Not on file    Active member of club or organization: Not on file    Attends meetings of clubs or organizations: Not on file    Relationship status: Not on file  . Intimate partner violence:    Fear of current or ex partner: Not on file    Emotionally abused: Not on file    Physically abused: Not on file    Forced sexual activity: Not on file  Other Topics Concern  . Not on file  Social History Narrative   Did auto salvage work.    Outpatient Encounter Medications as of 12/26/2017  Medication Sig  . allopurinol (ZYLOPRIM) 100 MG tablet TAKE 1 TABLET BY MOUTH AT  BEDTIME  . amLODipine (NORVASC) 5 MG tablet TAKE 1 TABLET BY MOUTH  DAILY  . aspirin 81 MG tablet Take 81 mg by mouth every morning.   [name] atorvastatin (LIPITOR) 80 MG tablet TAKE 1 TABLET (80 MG TOTAL) BY MOUTH EVERY EVENING.  [name] clopidogrel (PLAVIX) 75 MG tablet TAKE 1 TABLET BY MOUTH  DAILY  . furosemide (LASIX) 40 MG tablet TAKE 1 TABLET BY MOUTH EVERY MORNING TAKE AS NEEDED FOR LEG SWELLING  . isosorbide mononitrate (IMDUR) 30 MG 24 hr tablet Take 1 tablet (30 mg total) by mouth daily.  . magnesium oxide (MAG-OX) 400 (241.3 Mg) MG tablet TAKE 1 TABLET BY MOUTH TWO TIMES DAILY.  . Multiple Vitamin (MULTIVITAMIN) tablet Take 1 tablet by mouth every morning.   . nitroGLYCERIN (NITROSTAT) 0.4 MG SL tablet Place 0.4 mg under the tongue every 5 (five) minutes as needed for chest pain (Up to 3 times).  . nystatin cream (MYCOSTATIN) Apply 1 application topically 2 (two) times daily  as needed for dry skin.  . pantoprazole (PROTONIX) 40 MG tablet TAKE 1 TABLET BY MOUTH  DAILY  . ranitidine (ZANTAC) 150 MG tablet TAKE 1 TABLET BY MOUTH AT  BEDTIME  . sodium bicarbonate 650 MG tablet Take 650 mg by mouth 2 (two) times daily.  . carvedilol (COREG) 25 MG tablet Take 1 tablet (25 mg total) by mouth 2 (two) times daily.  . [  DISCONTINUED] isosorbide mononitrate (IMDUR) 30 MG 24 hr tablet TAKE 1 TABLET BY MOUTH EVERY DAY   No facility-administered encounter medications on file as of 12/26/2017.     Allergies  Allergen Reactions  . Predicort [Prednisolone] Other (See Comments)    Stomach pain  . Ciprofloxacin     GI upset  . Hydrochlorothiazide Other (See Comments)    Dehydration  . Hydrocodone     Stomach upset  . Hydrocodone-Acetaminophen     Stomach upset  . Hydrocodone-Acetaminophen Nausea Only  . Loratadine Other (See Comments)    Other reaction(s): Unknown  . Sulfa Antibiotics Other (See Comments)    Cannot recall  . Sulfacetamide Sodium Other (See Comments)    Cannot recall  . Sulfasalazine     Other reaction(s): Other (See Comments) Cannot recall  . Hydrocodone-Acetaminophen Nausea Only  . Penicillins Hives and Rash    Has patient had a PCN reaction causing immediate rash, facial/tongue/throat swelling, SOB or lightheadedness with hypotension: YES Has patient had a PCN reaction causing severe rash involving mucus membranes or skin necrosis: NO Has patient had a PCN reaction that required hospitalization NO Has patient had a PCN reaction occurring within the last 10 years:NO If all of the above answers are "NO", then may proceed with Cephalosporin use.    Review of Systems  Constitutional: Negative for fever and weight loss.  Eyes: Negative.   Respiratory: Negative for cough, shortness of breath and wheezing.   Cardiovascular: Negative for chest pain, palpitations, orthopnea, claudication and leg swelling.       Tachycardia-has cardiology appointment in  3 days.   Gastrointestinal: Negative.   Skin: Negative.   Endo/Heme/Allergies: Negative.   Psychiatric/Behavioral: Negative.     Objective:  BP (!) 166/67 (BP Location: Right Arm, Patient Position: Sitting, Cuff Size: Normal)   Pulse 70   Temp 98.2 F (36.8 C) (Oral)   Resp 16   Wt 230 lb (104.3 kg)   BMI 33.00 kg/m   Physical Exam  Constitutional: He is oriented to person, place, and time and well-developed, well-nourished, and in no distress.  HENT:  Head: Normocephalic and atraumatic.  Right Ear: External ear normal.  Left Ear: External ear normal.  Nose: Nose normal.  Eyes: Conjunctivae are normal.  Neck: No thyromegaly present.  Cardiovascular: Normal rate, regular rhythm and normal heart sounds.  Pulmonary/Chest: Effort normal and breath sounds normal.  Abdominal: Soft.  Musculoskeletal: He exhibits no edema.  Left calf 15 7/8 Right calf 16 1/8 Negative cords ,negative Homans.   Neurological: He is alert and oriented to person, place, and time. Gait normal. GCS score is 15.  Skin: Skin is warm and dry.  Psychiatric: Mood, memory, affect and judgment normal.    Assessment and Plan :  1. Myalgia  Discuss stopping statin next week with cardiology. - CK 2.Right Calcaneal Bursitis May need podiatry referral.  3.CAD All risk factors treated.  I have done the exam and reviewed the chart and it is accurate to the best of my knowledge. Development worker, community has been used and  any errors in dictation or transcription are unintentional. Miguel Aschoff M.D. [facility]

## 2017-12-27 LAB — CK: Total CK: 58 U/L (ref 24–204)

## 2017-12-30 ENCOUNTER — Ambulatory Visit: Payer: Medicare Other | Admitting: Cardiovascular Disease

## 2017-12-30 ENCOUNTER — Encounter: Payer: Self-pay | Admitting: Cardiovascular Disease

## 2017-12-30 VITALS — BP 134/70 | HR 64 | Ht 70.0 in | Wt 227.8 lb

## 2017-12-30 DIAGNOSIS — I255 Ischemic cardiomyopathy: Secondary | ICD-10-CM

## 2017-12-30 DIAGNOSIS — I10 Essential (primary) hypertension: Secondary | ICD-10-CM

## 2017-12-30 DIAGNOSIS — I714 Abdominal aortic aneurysm, without rupture, unspecified: Secondary | ICD-10-CM

## 2017-12-30 DIAGNOSIS — I493 Ventricular premature depolarization: Secondary | ICD-10-CM | POA: Diagnosis not present

## 2017-12-30 DIAGNOSIS — I5042 Chronic combined systolic (congestive) and diastolic (congestive) heart failure: Secondary | ICD-10-CM

## 2017-12-30 DIAGNOSIS — E7800 Pure hypercholesterolemia, unspecified: Secondary | ICD-10-CM | POA: Diagnosis not present

## 2017-12-30 DIAGNOSIS — I25118 Atherosclerotic heart disease of native coronary artery with other forms of angina pectoris: Secondary | ICD-10-CM

## 2017-12-30 MED ORDER — ATORVASTATIN CALCIUM 40 MG PO TABS: 40.0000 mg | ORAL_TABLET | Freq: Every day | ORAL | 3 refills | Status: DC

## 2017-12-30 NOTE — Progress Notes (Signed)
Chief Complaint  Patient presents with  . Follow-up    CAD    History of Present Illness: 72 y.o. male with history of CAD s/p CABG in 1996 (LIMA-LAD, SVG-circumflex, SVG-RCA, SVG-diagonal) with redo bypass in 2007 (SVG-OM, SVG-RCA), ischemic cardiomyopathy, CKD, DM 2, HTN, HLD, renal artery stenosis, COPD, GERD and PVCs here today for cardiac follow up. Echo in 2014 with normal LV systolic function. He was hospitalized at [facility] July 2016 with a NSTEMI. Cardiac cath with severe disease in the SVG to OM treated with a drug eluting stent. He was treated with Brilinta post PCI but developed dyspnea and was changed to Plavix. He was admitted to [facility] May 2017 with unstable angina and cardiac cath showed severe disease in the distal body/anastomosis of the SVG to Diagonal.  A drug eluting stent was placed in this vein graft. The LIMA to LAD was patent, SVG to OM was patent, SVG to PDA/PLA was patent. He was admitted to [facility] February 2018 with chest pain. Troponin was negative x 3. He did not have an ischemic evaluation. Echo February 2018 with  mild LV systolic dysfunction. LVEF=45-50%. I saw him in the office in January 2019 and he was volume overloaded. Lasix was started. He was admitted to [facility] February 2019 with unstable angina and cardiac cath showed patency of all of the bypass grafts. Echo February 2019 with normal LV systolic function, PYKD=98-33%, grade 1 diastolic dysfunction.   He is here today for follow up. The patient denies any chest pain, dyspnea, palpitations, lower extremity edema, orthopnea, PND, dizziness, near syncope or syncope.   Primary Care Physician: [name]., MD   Past Medical History:  Diagnosis Date  . AAA (abdominal aortic aneurysm) ([place])    a. 3cm by [place] 2015.  [name] Arthritis    "hips; back" (12/13/2014)  . CAD (coronary artery disease) 2007   a. s/p CABG- IMA-LAD, VG-Cx, VG-RCA, VG-diag in 1999. B. sp redo CABG- VG-OM, VG-RCA in 2007 due to VG disease. c.  NSTEMI 11/2014 s/p DES to SVG-OM from the Y graft.d. PTCA/DES x 1 distal body of SVG to Diagonal.09/2015  . Chronic combined systolic and diastolic CHF (congestive heart failure) ([place])    a. remote EF 40-45% in 2006. b. Normal EF 2014. b. Echo 07/2016 EF 45-50%, grade 1 DD.  [name] Chronic lower back pain   . CKD (chronic kidney disease), stage IV ([place])   . COPD (chronic obstructive pulmonary disease) ([place])   . Deafness in left ear   . Degenerative disc disease, lumbar   . Dilated cardiomyopathy ([place]) 10/07/2015  . Emphysema   . Esophageal stricture 07/02/1998   EGD  . GERD (gastroesophageal reflux disease)   . History of gout    "last flareup was in 2007" (12/13/2014)  . History of hiatal hernia   . Hyperlipidemia   . Hypertension   . Ischemic cardiomyopathy 2006   EF 40% to 50% by 2D echo in 2006;  Echo 12/31/12: Mild LVH, EF 50-55%, normal wall motion.   [name] PVC's (premature ventricular contractions)   . Renal artery stenosis ([place])    a. noted on CT 2008.  . Type II diabetes mellitus ([place])    Diet control   . Walking pneumonia 1990's    Past Surgical History:  Procedure Laterality Date  . CARDIAC CATHETERIZATION  "several"  . CARDIAC CATHETERIZATION N/A 12/13/2014   Procedure: Left Heart Cath and Coronary Angiography;  Surgeon: Jettie Booze, MD;  Location: [facility]  INVASIVE CV LAB;  Service: Cardiovascular;  Laterality: N/A;  . CARDIAC CATHETERIZATION  12/13/2014   Procedure: Coronary Stent Intervention;  Surgeon: Jettie Booze, MD;  Location: [place] CV LAB;  Service: Cardiovascular;;  . CARDIAC CATHETERIZATION N/A 10/07/2015   Procedure: Left Heart Cath and Cors/Grafts Angiography;  Surgeon: Burnell Blanks, MD;  Location: [place] CV LAB;  Service: Cardiovascular;  Laterality: N/A;  . CARDIAC CATHETERIZATION N/A 10/07/2015   Procedure: Coronary Stent Intervention;  Surgeon: Burnell Blanks, MD;  Location: [place] CV LAB;  Service: Cardiovascular;  Laterality: N/A;   . CORONARY ANGIOPLASTY  "several"  . CORONARY ANGIOPLASTY WITH STENT PLACEMENT  2005; 12/13/2014   "2; 1"  . CORONARY ARTERY BYPASS GRAFT  1996   CABG X5  . CORONARY ARTERY BYPASS GRAFT  March 2007   CABG X3  . ESOPHAGOGASTRODUODENOSCOPY (EGD) WITH ESOPHAGEAL DILATION  2000  . GREEN LIGHT LASER TURP (TRANSURETHRAL RESECTION OF PROSTATE  2000's   "not cancerous"  . HERNIA REPAIR    . LAPAROSCOPIC CHOLECYSTECTOMY    . LEFT HEART CATH AND CORS/GRAFTS ANGIOGRAPHY N/A 07/28/2017   Procedure: LEFT HEART CATH AND CORS/GRAFTS ANGIOGRAPHY;  Surgeon: Troy Sine, MD;  Location: [place] CV LAB;  Service: Cardiovascular;  Laterality: N/A;  . LUNG SURGERY  1996   "S/P CABG, had to put staple in lung after it had collapsed"  . UMBILICAL HERNIA REPAIR     w/chole    Current Outpatient Medications  Medication Sig Dispense Refill  . allopurinol (ZYLOPRIM) 100 MG tablet TAKE 1 TABLET BY MOUTH AT  BEDTIME 90 tablet 3  . amLODipine (NORVASC) 5 MG tablet TAKE 1 TABLET BY MOUTH  DAILY 90 tablet 3  . aspirin 81 MG tablet Take 81 mg by mouth every morning.     . clopidogrel (PLAVIX) 75 MG tablet TAKE 1 TABLET BY MOUTH  DAILY 90 tablet 2  . furosemide (LASIX) 40 MG tablet TAKE 1 TABLET BY MOUTH EVERY MORNING TAKE AS NEEDED FOR LEG SWELLING  6  . isosorbide mononitrate (IMDUR) 30 MG 24 hr tablet Take 1 tablet (30 mg total) by mouth daily. 90 tablet 3  . magnesium oxide (MAG-OX) 400 (241.3 Mg) MG tablet TAKE 1 TABLET BY MOUTH TWO TIMES DAILY. 180 tablet 3  . Multiple Vitamin (MULTIVITAMIN) tablet Take 1 tablet by mouth every morning.     . nitroGLYCERIN (NITROSTAT) 0.4 MG SL tablet Place 0.4 mg under the tongue every 5 (five) minutes as needed for chest pain (Up to 3 times).    . pantoprazole (PROTONIX) 40 MG tablet TAKE 1 TABLET BY MOUTH  DAILY 90 tablet 3  . ranitidine (ZANTAC) 150 MG tablet TAKE 1 TABLET BY MOUTH AT  BEDTIME 90 tablet 3  . sodium bicarbonate 650 MG tablet Take 650 mg by mouth 2  (two) times daily.    [name] atorvastatin (LIPITOR) 40 MG tablet Take 1 tablet (40 mg total) by mouth daily. 90 tablet 3  . carvedilol (COREG) 25 MG tablet Take 1 tablet (25 mg total) by mouth 2 (two) times daily. 180 tablet 3   No current facility-administered medications for this visit.     Allergies  Allergen Reactions  . Predicort [Prednisolone] Other (See Comments)    Stomach pain  . Ciprofloxacin     GI upset  . Hydrochlorothiazide Other (See Comments)    Dehydration  . Hydrocodone     Stomach upset  . Hydrocodone-Acetaminophen     Stomach upset  .  Hydrocodone-Acetaminophen Nausea Only  . Loratadine Other (See Comments)    Other reaction(s): Unknown  . Sulfa Antibiotics Other (See Comments)    Cannot recall  . Sulfacetamide Sodium Other (See Comments)    Cannot recall  . Sulfasalazine     Other reaction(s): Other (See Comments) Cannot recall  . Hydrocodone-Acetaminophen Nausea Only  . Penicillins Hives and Rash    Has patient had a PCN reaction causing immediate rash, facial/tongue/throat swelling, SOB or lightheadedness with hypotension: YES Has patient had a PCN reaction causing severe rash involving mucus membranes or skin necrosis: NO Has patient had a PCN reaction that required hospitalization NO Has patient had a PCN reaction occurring within the last 10 years:NO If all of the above answers are "NO", then may proceed with Cephalosporin use.    Social History   Socioeconomic History  . Marital status: Married    Spouse name: Not on file  . Number of children: 2  . Years of education: Not on file  . Highest education level: Not on file  Occupational History  . Occupation: Retired  Scientific laboratory technician  . Financial resource strain: Not on file  . Food insecurity:    Worry: Not on file    Inability: Not on file  . Transportation needs:    Medical: Not on file    Non-medical: Not on file  Tobacco Use  . Smoking status: Former Smoker    Packs/day: 3.00    Years:  20.00    Pack years: 60.00    Types: Cigarettes    Last attempt to quit: 07/25/1986    Years since quitting: 31.4  . Smokeless tobacco: Never Used  Substance and Sexual Activity  . Alcohol use: No    Alcohol/week: 0.0 oz  . Drug use: No  . Sexual activity: Not Currently  Lifestyle  . Physical activity:    Days per week: Not on file    Minutes per session: Not on file  . Stress: Not on file  Relationships  . Social connections:    Talks on phone: Not on file    Gets together: Not on file    Attends religious service: Not on file    Active member of club or organization: Not on file    Attends meetings of clubs or organizations: Not on file    Relationship status: Not on file  . Intimate partner violence:    Fear of current or ex partner: Not on file    Emotionally abused: Not on file    Physically abused: Not on file    Forced sexual activity: Not on file  Other Topics Concern  . Not on file  Social History Narrative   Did auto salvage work.    Family History  Problem Relation Age of Onset  . Heart attack Mother        MI  . Stroke Mother   . Heart disease Mother   . Hypertension Mother   . Hyperlipidemia Mother   . Heart disease Father   . Rheumatic fever Father   . Colon cancer Neg Hx     Review of Systems:  As stated in the HPI and otherwise negative.   BP 134/70   Pulse 64   Ht 5\' 10"  (1.778 m)   Wt 227 lb 12.8 oz (103.3 kg)   SpO2 98%   BMI 32.69 kg/m   Physical Examination:  General: Well developed, well nourished, NAD  HEENT: OP clear, mucus membranes moist  SKIN:  warm, dry. No rashes. Neuro: No focal deficits  Musculoskeletal: Muscle strength 5/5 all ext  Psychiatric: Mood and affect normal  Neck: No JVD, no carotid bruits, no thyromegaly, no lymphadenopathy.  Lungs:Clear bilaterally, no wheezes, rhonci, crackles Cardiovascular: Regular rate and rhythm. No murmurs, gallops or rubs. Abdomen:Soft. Bowel sounds present. Non-tender.    Extremities: No lower extremity edema. Pulses are 2 + in the bilateral DP/PT.  Echo 07/14/17: - Left ventricle: The cavity size was mildly dilated. Systolic   function was normal. The estimated ejection fraction was in the   range of 55% to 60%. Doppler parameters are consistent with   abnormal left ventricular relaxation (grade 1 diastolic   dysfunction).  Cardiac cath February 2019: Significant native CAD with total occlusion of the LAD after the takeoff of the first diagonal vessel; total occlusion of the proximal left circumflex coronary artery; and total occlusion of the proximal RCA with antegrade bridging collaterals. Patent LIMA to LAD. Patent Y vein graft from the 1996 surgery which supplies the diagonal vessel and distal circumflex marginal vessel.  There is diffuse narrowing of 50% in the midportion of the Y graft supplying the diagonal vessel and a patent distal stent extending to the ostium of the diagonal.  The Y graft supplying the distal marginal is free of significant disease and has mild luminal irregularity. Patent SVG supplying the distal RCA from the 2007 surgery with a stent in the proximal portion of the graft with 30% narrowings in the proximal and mid segment. LVEDP 18 mm  Diagnostic Diagram        EKG:  EKG is not ordered today. The ekg ordered today demonstrates  Recent Labs: 07/07/2017: Magnesium 2.4; NT-Pro BNP 569 07/08/2017: B Natriuretic Peptide 148.4 11/22/2017: ALT 16; BUN 38; Creatinine, Ser 2.30; Hemoglobin 12.7; Platelets 133; Potassium 4.9; Sodium 141; TSH 2.580   Lipid Panel    Component Value Date/Time   CHOL 115 11/22/2017 0931   TRIG 68 11/22/2017 0931   HDL 45 11/22/2017 0931   CHOLHDL 4.5 12/13/2014 0423   VLDL 25 12/13/2014 0423   LDLCALC 56 11/22/2017 0931     Wt Readings from Last 3 Encounters:  12/30/17 227 lb 12.8 oz (103.3 kg)  12/26/17 230 lb (104.3 kg)  11/21/17 255 lb (115.7 kg)     Other studies Reviewed: Additional  studies/ records that were reviewed today include: . Review of the above records demonstrates:    Assessment and Plan:   1. CAD s/p CABG with stable angina: Recent cardiac cath February 2019 with 4 patent vein grafts. Echo February 2019 with normal LV function. Continue ASA, Plavix, statin, beta blocker and Imdur.     2. Ischemic Cardiomyopathy: Last LVEF normal by echo February 2019. Continue medical therapy with beta blocker. He has been off of the Ace-inh due to CKD.   3. Hypertension: His BP is controlled. No changes  4. Hyperlipidemia: Lipids followed in primary care. Continue statin. Will reduce Lipitor to 40 mg daily due to leg cramps.   5. CKD: Followed by Nephrology. Ace-inh stopped due to renal insufficiency.   6. Chronic systolic CHF: Volume status is ok today. Continue Lasix daily.   7. PVCs/[place]: no palpitations. Continue beta blocker.    8. AAA: stable by u/s February 2019. Follow yearly    Current medicines are reviewed at length with the patient today.  The patient does not have concerns regarding medicines.  The following changes have been made:  no change  Labs/ tests  ordered today include:   No orders of the defined types were placed in this encounter.   Disposition:   FU with me in 6 months.    Signed, Lauree Chandler, MD 12/30/2017 4:34 PM    [facility] [address], [place], [place]  [address] Phone: [phone]; Fax: [phone]

## 2017-12-30 NOTE — Patient Instructions (Signed)
Medication Instructions:  Your physician has recommended you make the following change in your medication: Decrease atorvastatin to 40 mg by mouth daily.    Labwork: none  Testing/Procedures: none  Follow-Up: Your physician recommends that you schedule a follow-up appointment in: 6 months. Please call our office in about 2 months to schedule this appointment.     Any Other Special Instructions Will Be Listed Below (If Applicable).     If you need a refill on your cardiac medications before your next appointment, please call your pharmacy.

## 2018-01-06 DIAGNOSIS — M5136 Other intervertebral disc degeneration, lumbar region: Secondary | ICD-10-CM | POA: Diagnosis not present

## 2018-01-06 DIAGNOSIS — M62830 Muscle spasm of back: Secondary | ICD-10-CM | POA: Diagnosis not present

## 2018-01-06 DIAGNOSIS — M5416 Radiculopathy, lumbar region: Secondary | ICD-10-CM | POA: Diagnosis not present

## 2018-01-06 DIAGNOSIS — M48062 Spinal stenosis, lumbar region with neurogenic claudication: Secondary | ICD-10-CM | POA: Diagnosis not present

## 2018-01-10 ENCOUNTER — Other Ambulatory Visit: Payer: Self-pay | Admitting: Family Medicine

## 2018-01-23 ENCOUNTER — Ambulatory Visit (INDEPENDENT_AMBULATORY_CARE_PROVIDER_SITE_OTHER): Payer: Medicare Other | Admitting: Family Medicine

## 2018-01-23 VITALS — BP 150/74 | HR 63 | Temp 97.7°F | Resp 16 | Wt 232.0 lb

## 2018-01-23 DIAGNOSIS — E119 Type 2 diabetes mellitus without complications: Secondary | ICD-10-CM

## 2018-01-23 DIAGNOSIS — R1084 Generalized abdominal pain: Secondary | ICD-10-CM | POA: Diagnosis not present

## 2018-01-23 DIAGNOSIS — I25110 Atherosclerotic heart disease of native coronary artery with unstable angina pectoris: Secondary | ICD-10-CM

## 2018-01-23 DIAGNOSIS — R1032 Left lower quadrant pain: Secondary | ICD-10-CM | POA: Diagnosis not present

## 2018-01-23 NOTE — Progress Notes (Signed)
Ronald [name] [facility]  MRN: [id number] DOB: 10/31/1945  Subjective:  HPI   The patient is a 72 year old male who presents with complaint of abdominal pain that comes and goes for the last week.  He states that he has had this in the past.  He is on Ranitidine, Pantoprazole and sodium bicarbonate.   He states that he has been scanned in the past but does not think they found anything wrong. This seems to flare up with eating. No diarrhea or blood. No N/V. He has lost weight per pt. He is having no chest pain or SOB.  Patient Active Problem List   Diagnosis Date Noted  . CKD (chronic kidney disease), stage IV ([place]) 07/29/2017  . Obesity 07/29/2017  . Unstable angina ([place]) 07/27/2017  . Atherosclerosis of native arteries of extremity with intermittent claudication ([place]) 06/25/2016  . Acute renal failure superimposed on stage 3 chronic kidney disease ([place]) 10/07/2015  . Dilated cardiomyopathy ([place]) 10/07/2015  . Coronary artery disease involving native coronary artery of native heart with unstable angina pectoris ([place])   . Allergic rhinitis 08/19/2015  . NSTEMI (non-ST elevated myocardial infarction) ([place]) 12/13/2014  . AA (aortic aneurysm) ([place]) 10/04/2014  . Arteriosclerosis of coronary artery 10/04/2014  . Diabetes mellitus, type 2 ([place]) 10/04/2014  . Acid reflux 10/04/2014  . 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 HLD (hyperlipidemia) 10/04/2014  . BP (high blood pressure) 10/04/2014  . Osteoarthrosis 10/04/2014  . Adult BMI 30+ 10/04/2014  . 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 Urge incontinence of urine 10/24/2012  . 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 Abdominal pain 11/09/2010  . Shortness of breath 11/12/2009  . Renal artery stenosis    . Mixed hyperlipidemia 09/04/2008  . History of redo bypass grafting 09/04/2008  . Diabetes mellitus with nephropathy [facility])     Past Medical History:  Diagnosis Date   . AAA (abdominal aortic aneurysm) ([place])    a. 3cm by [place] 2015.  [name] Arthritis    "hips; back" (12/13/2014)  . CAD (coronary artery disease) 2007   a. s/p CABG- IMA-LAD, VG-Cx, VG-RCA, VG-diag in 1999. B. sp redo CABG- VG-OM, VG-RCA in 2007 due to VG disease. c. NSTEMI 11/2014 s/p DES to SVG-OM from the Y graft.d. PTCA/DES x 1 distal body of SVG to Diagonal.09/2015  . Chronic combined systolic and diastolic CHF (congestive heart failure) ([place])    a. remote EF 40-45% in 2006. b. Normal EF 2014. b. Echo 07/2016 EF 45-50%, grade 1 DD.  [name] Chronic lower back pain   . CKD (chronic kidney disease), stage IV ([place])   . COPD (chronic obstructive pulmonary disease) ([place])   . Deafness in left ear   . Degenerative disc disease, lumbar   . Dilated cardiomyopathy ([place]) 10/07/2015  . Emphysema   . Esophageal stricture 07/02/1998   EGD  . GERD (gastroesophageal reflux disease)   . History of gout    "last flareup was in 2007" (12/13/2014)  . History of hiatal hernia   . Hyperlipidemia   . Hypertension   . Ischemic cardiomyopathy 2006   EF 40% to 50% by 2D echo in 2006;  Echo 12/31/12: Mild LVH, EF 50-55%, normal wall motion.   [name] PVC's (premature ventricular contractions)   . Renal artery stenosis ([place])    a. noted on CT 2008.  . Type II diabetes mellitus ([place])    Diet control   . Walking pneumonia 1990's  Social History   Socioeconomic History  . Marital status: Married    Spouse name: Not on file  . Number of children: 2  . Years of education: Not on file  . Highest education level: Not on file  Occupational History  . Occupation: Retired  Scientific laboratory technician  . Financial resource strain: Not on file  . Food insecurity:    Worry: Not on file    Inability: Not on file  . Transportation needs:    Medical: Not on file    Non-medical: Not on file  Tobacco Use  . Smoking status: Former Smoker    Packs/day: 3.00    Years: 20.00    Pack years: 60.00    Types: Cigarettes    Last attempt to quit:  07/25/1986    Years since quitting: 31.5  . Smokeless tobacco: Never Used  Substance and Sexual Activity  . Alcohol use: No    Alcohol/week: 0.0 standard drinks  . Drug use: No  . Sexual activity: Not Currently  Lifestyle  . Physical activity:    Days per week: Not on file    Minutes per session: Not on file  . Stress: Not on file  Relationships  . Social connections:    Talks on phone: Not on file    Gets together: Not on file    Attends religious service: Not on file    Active member of club or organization: Not on file    Attends meetings of clubs or organizations: Not on file    Relationship status: Not on file  . Intimate partner violence:    Fear of current or ex partner: Not on file    Emotionally abused: Not on file    Physically abused: Not on file    Forced sexual activity: Not on file  Other Topics Concern  . Not on file  Social History Narrative   Did auto salvage work.    Outpatient Encounter Medications as of 01/23/2018  Medication Sig  . allopurinol (ZYLOPRIM) 100 MG tablet TAKE 1 TABLET BY MOUTH AT  BEDTIME  . amLODipine (NORVASC) 5 MG tablet TAKE 1 TABLET BY MOUTH  DAILY  . aspirin 81 MG tablet Take 81 mg by mouth every morning.   [name] atorvastatin (LIPITOR) 40 MG tablet Take 1 tablet (40 mg total) by mouth daily.  . clopidogrel (PLAVIX) 75 MG tablet TAKE 1 TABLET BY MOUTH  DAILY  . furosemide (LASIX) 40 MG tablet TAKE 1 TABLET BY MOUTH EVERY MORNING TAKE AS NEEDED FOR LEG SWELLING  . isosorbide mononitrate (IMDUR) 30 MG 24 hr tablet Take 1 tablet (30 mg total) by mouth daily.  . magnesium oxide (MAG-OX) 400 (241.3 Mg) MG tablet TAKE 1 TABLET BY MOUTH TWO TIMES DAILY.  . Multiple Vitamin (MULTIVITAMIN) tablet Take 1 tablet by mouth every morning.   . nitroGLYCERIN (NITROSTAT) 0.4 MG SL tablet Place 0.4 mg under the tongue every 5 (five) minutes as needed for chest pain (Up to 3 times).  . pantoprazole (PROTONIX) 40 MG tablet TAKE 1 TABLET BY MOUTH  DAILY  .  ranitidine (ZANTAC) 150 MG tablet TAKE 1 TABLET BY MOUTH AT  BEDTIME  . sodium bicarbonate 650 MG tablet Take 650 mg by mouth 2 (two) times daily.  . carvedilol (COREG) 25 MG tablet Take 1 tablet (25 mg total) by mouth 2 (two) times daily.   No facility-administered encounter medications on file as of 01/23/2018.     Allergies  Allergen Reactions  . Predicort [  Prednisolone] Other (See Comments)    Stomach pain  . Ciprofloxacin     GI upset  . Hydrochlorothiazide Other (See Comments)    Dehydration  . Hydrocodone     Stomach upset  . Hydrocodone-Acetaminophen     Stomach upset  . Hydrocodone-Acetaminophen Nausea Only  . Loratadine Other (See Comments)    Other reaction(s): Unknown  . Sulfa Antibiotics Other (See Comments)    Cannot recall  . Sulfacetamide Sodium Other (See Comments)    Cannot recall  . Sulfasalazine     Other reaction(s): Other (See Comments) Cannot recall  . Hydrocodone-Acetaminophen Nausea Only  . Penicillins Hives and Rash    Has patient had a PCN reaction causing immediate rash, facial/tongue/throat swelling, SOB or lightheadedness with hypotension: YES Has patient had a PCN reaction causing severe rash involving mucus membranes or skin necrosis: NO Has patient had a PCN reaction that required hospitalization NO Has patient had a PCN reaction occurring within the last 10 years:NO If all of the above answers are "NO", then may proceed with Cephalosporin use.    Review of Systems  Constitutional: Positive for malaise/fatigue. Negative for fever.  Eyes: Negative.   Respiratory: Negative for cough. Shortness of breath: chronic.   Cardiovascular: Negative for chest pain.  Gastrointestinal: Positive for abdominal pain and constipation. Negative for blood in stool, diarrhea, heartburn, melena, nausea and vomiting.  Musculoskeletal: Positive for back pain and joint pain.  Neurological: Negative.   Endo/Heme/Allergies: Negative.   Psychiatric/Behavioral:  Negative.     Objective:  BP (!) 150/74 (BP Location: Right Arm, Patient Position: Sitting, Cuff Size: Normal)   Pulse 63   Temp 97.7 F (36.5 C) (Oral)   Resp 16   Wt 232 lb (105.2 kg)   BMI 33.29 kg/m   Physical Exam  Constitutional: He is oriented to person, place, and time and well-developed, well-nourished, and in no distress.  HENT:  Head: Normocephalic and atraumatic.  Right Ear: External ear normal.  Left Ear: External ear normal.  Nose: Nose normal.  Eyes: Conjunctivae are normal.  Neck: No thyromegaly present.  Cardiovascular: Normal rate, regular rhythm and normal heart sounds.  Pulmonary/Chest: Effort normal and breath sounds normal.  Abdominal: Soft.  There are small bruises around entire abdomen. It appears as though it could be areas where pt presses down with his fingers on abdomen. No apparent periumbilical bruising.  Musculoskeletal: He exhibits no edema.  Neurological: He is alert and oriented to person, place, and time. Gait normal. GCS score is 15.  Skin: Skin is warm and dry.  Psychiatric: Mood, memory, affect and judgment normal.    Assessment and Plan :  1. Left lower quadrant pain  - EKG 12-Lead - Lipase - CBC with Differential/Platelet - Comprehensive metabolic panel - TSH  2. Diffuse abdominal pain Mesenteric ischemia possible .GI w/u pending.  - US Abdomen Complete; Future '3.CAD No evidence this is cardiac. Pulse ox 97%. ECG unchanged. 4.GERD 5CKD 6.Aortic Aneurysm  I have done the exam and reviewed the chart and it is accurate to the best of my knowledge. Development worker, community has been used and  any errors in dictation or transcription are unintentional. Miguel Aschoff M.D. [facility]

## 2018-01-24 ENCOUNTER — Telehealth: Payer: Self-pay

## 2018-01-24 LAB — CBC WITH DIFFERENTIAL/PLATELET
BASOS ABS: 0.1 10*3/uL (ref 0.0–0.2)
Basos: 1 %
EOS (ABSOLUTE): 0.4 10*3/uL (ref 0.0–0.4)
Eos: 6 %
Hematocrit: 39.3 % (ref 37.5–51.0)
Hemoglobin: 13.2 g/dL (ref 13.0–17.7)
IMMATURE GRANULOCYTES: 0 %
Immature Grans (Abs): 0 10*3/uL (ref 0.0–0.1)
Lymphocytes Absolute: 1.2 10*3/uL (ref 0.7–3.1)
Lymphs: 18 %
MCH: 29.8 pg (ref 26.6–33.0)
MCHC: 33.6 g/dL (ref 31.5–35.7)
MCV: 89 fL (ref 79–97)
MONOS ABS: 0.8 10*3/uL (ref 0.1–0.9)
Monocytes: 13 %
Neutrophils Absolute: 4.1 10*3/uL (ref 1.4–7.0)
Neutrophils: 62 %
PLATELETS: 133 10*3/uL — AB (ref 150–450)
RBC: 4.43 x10E6/uL (ref 4.14–5.80)
RDW: 14.2 % (ref 12.3–15.4)
WBC: 6.6 10*3/uL (ref 3.4–10.8)

## 2018-01-24 LAB — COMPREHENSIVE METABOLIC PANEL
ALK PHOS: 140 IU/L — AB (ref 39–117)
ALT: 13 IU/L (ref 0–44)
AST: 16 IU/L (ref 0–40)
Albumin/Globulin Ratio: 2.2 (ref 1.2–2.2)
Albumin: 4.3 g/dL (ref 3.5–4.8)
BUN/Creatinine Ratio: 14 (ref 10–24)
BUN: 34 mg/dL — ABNORMAL HIGH (ref 8–27)
Bilirubin Total: 1 mg/dL (ref 0.0–1.2)
CALCIUM: 9.7 mg/dL (ref 8.6–10.2)
CO2: 24 mmol/L (ref 20–29)
CREATININE: 2.39 mg/dL — AB (ref 0.76–1.27)
Chloride: 106 mmol/L (ref 96–106)
GFR calc Af Amer: 30 mL/min/{1.73_m2} — ABNORMAL LOW (ref >59)
GFR, EST NON AFRICAN AMERICAN: 26 mL/min/{1.73_m2} — AB (ref >59)
GLUCOSE: 124 mg/dL — AB (ref 65–99)
Globulin, Total: 2 g/dL (ref 1.5–4.5)
POTASSIUM: 5.1 mmol/L (ref 3.5–5.2)
Sodium: 143 mmol/L (ref 134–144)
Total Protein: 6.3 g/dL (ref 6.0–8.5)

## 2018-01-24 LAB — TSH: TSH: 2.59 u[IU]/mL (ref 0.450–4.500)

## 2018-01-24 LAB — LIPASE: LIPASE: 43 U/L (ref 13–78)

## 2018-01-24 NOTE — Telephone Encounter (Signed)
Advised  ED 

## 2018-01-24 NOTE — Telephone Encounter (Signed)
LMTCB 01/24/2018  Thanks,   -Mickel Baas

## 2018-01-24 NOTE — Telephone Encounter (Signed)
Pt's wife returned call. Please advise. Thanks TNP

## 2018-01-24 NOTE — Telephone Encounter (Signed)
-----   Message from [name]., MD sent at 01/24/2018  8:27 AM EDT ----- Stable.

## 2018-01-25 ENCOUNTER — Encounter: Payer: Self-pay | Admitting: Family Medicine

## 2018-01-27 ENCOUNTER — Ambulatory Visit
Admission: RE | Admit: 2018-01-27 | Discharge: 2018-01-27 | Disposition: A | Discharge status: Home / Self Care | Payer: Medicare Other | Source: Ambulatory Visit | Attending: Family Medicine | Admitting: Family Medicine

## 2018-01-27 DIAGNOSIS — R1084 Generalized abdominal pain: Secondary | ICD-10-CM | POA: Diagnosis not present

## 2018-01-27 DIAGNOSIS — Z9049 Acquired absence of other specified parts of digestive tract: Secondary | ICD-10-CM | POA: Insufficient documentation

## 2018-01-27 DIAGNOSIS — N261 Atrophy of kidney (terminal): Secondary | ICD-10-CM | POA: Diagnosis not present

## 2018-01-27 DIAGNOSIS — N281 Cyst of kidney, acquired: Secondary | ICD-10-CM | POA: Insufficient documentation

## 2018-02-06 ENCOUNTER — Telehealth: Payer: Self-pay | Admitting: Physician Assistant

## 2018-02-06 ENCOUNTER — Encounter: Payer: Self-pay | Admitting: Family Medicine

## 2018-02-06 DIAGNOSIS — N183 Chronic kidney disease, stage 3 (moderate): Secondary | ICD-10-CM | POA: Diagnosis not present

## 2018-02-06 DIAGNOSIS — I10 Essential (primary) hypertension: Secondary | ICD-10-CM | POA: Diagnosis not present

## 2018-02-06 DIAGNOSIS — R600 Localized edema: Secondary | ICD-10-CM | POA: Diagnosis not present

## 2018-02-06 DIAGNOSIS — E872 Acidosis: Secondary | ICD-10-CM | POA: Diagnosis not present

## 2018-02-06 DIAGNOSIS — N2581 Secondary hyperparathyroidism of renal origin: Secondary | ICD-10-CM | POA: Diagnosis not present

## 2018-02-06 NOTE — Telephone Encounter (Signed)
Called pts pharmacy, [facility], to address letter we received from pts' [place] re: Atorvastatin 80 mg. Our records show this med was decreased 12/30/17 and it was reported by pts insurance that [facility] filled [facility]. Per [facility], the refills of the 80 mg Atorvastatin was cancelled in May of this year, 2019, and they returned it to stock 01/14/18 and they can confirm that pt has not refilled the Atorvastatin 80 mg recently.  Spoke with pts wife, DPR on file, and she confirms that they haven't received any Atorvastatin 80 mgs in August and that pt was only taking 40 mg.

## 2018-02-06 NOTE — Telephone Encounter (Signed)
Received fax from [employer]. At last OV with Dr. Angelena Form 8/2 he reduced atorvastatin dose to 40mg  daily and sent to [facility]. However, [facility] says [facility] atorvastatin for 80mg  daily on 01/14/18 for refill under my name. He should be on 40mg  daily. Please call pt and [facility] to clarify.  Jaysiah Marchetta PA-C

## 2018-02-08 DIAGNOSIS — N2581 Secondary hyperparathyroidism of renal origin: Secondary | ICD-10-CM | POA: Diagnosis not present

## 2018-02-08 DIAGNOSIS — I10 Essential (primary) hypertension: Secondary | ICD-10-CM | POA: Diagnosis not present

## 2018-02-08 DIAGNOSIS — D485 Neoplasm of uncertain behavior of skin: Secondary | ICD-10-CM | POA: Diagnosis not present

## 2018-02-08 DIAGNOSIS — R600 Localized edema: Secondary | ICD-10-CM | POA: Diagnosis not present

## 2018-02-08 DIAGNOSIS — D0439 Carcinoma in situ of skin of other parts of face: Secondary | ICD-10-CM | POA: Diagnosis not present

## 2018-02-08 DIAGNOSIS — L570 Actinic keratosis: Secondary | ICD-10-CM | POA: Diagnosis not present

## 2018-02-08 DIAGNOSIS — I701 Atherosclerosis of renal artery: Secondary | ICD-10-CM | POA: Diagnosis not present

## 2018-02-08 DIAGNOSIS — N183 Chronic kidney disease, stage 3 (moderate): Secondary | ICD-10-CM | POA: Diagnosis not present

## 2018-02-08 DIAGNOSIS — L821 Other seborrheic keratosis: Secondary | ICD-10-CM | POA: Diagnosis not present

## 2018-02-13 ENCOUNTER — Encounter: Payer: Self-pay | Admitting: Family Medicine

## 2018-02-13 ENCOUNTER — Ambulatory Visit: Payer: Self-pay | Admitting: Family Medicine

## 2018-02-13 ENCOUNTER — Ambulatory Visit (INDEPENDENT_AMBULATORY_CARE_PROVIDER_SITE_OTHER): Payer: Medicare Other | Admitting: Family Medicine

## 2018-02-13 VITALS — BP 132/64 | HR 68 | Temp 98.2°F | Resp 16 | Wt 234.0 lb

## 2018-02-13 DIAGNOSIS — I420 Dilated cardiomyopathy: Secondary | ICD-10-CM | POA: Diagnosis not present

## 2018-02-13 DIAGNOSIS — R1084 Generalized abdominal pain: Secondary | ICD-10-CM | POA: Diagnosis not present

## 2018-02-13 DIAGNOSIS — Z23 Encounter for immunization: Secondary | ICD-10-CM | POA: Diagnosis not present

## 2018-02-13 DIAGNOSIS — I25110 Atherosclerotic heart disease of native coronary artery with unstable angina pectoris: Secondary | ICD-10-CM | POA: Diagnosis not present

## 2018-02-13 NOTE — Progress Notes (Signed)
Patient: Ronald [name] [facility] Male    DOB: [birth year]   72 y.o.   MRN: [id number] Visit Date: 02/13/2018  Today's Provider: [name], MD   Chief Complaint  Patient presents with  . Abdominal Pain    follow up    Subjective:    HPI Patient comes in today for a follow up. He was seen in the office 3 weeks ago c/o abdmonial pain. He reports that his labs and abdominal US came back normal. Patient reports that he is still having pain. He denies any changes in bowel movements. Reports that the pain is constant. Nothing seems to relieve the pain.      Allergies  Allergen Reactions  . Predicort [Prednisolone] Other (See Comments)    Stomach pain  . Ciprofloxacin     GI upset  . Hydrochlorothiazide Other (See Comments)    Dehydration  . Hydrocodone     Stomach upset  . Hydrocodone-Acetaminophen     Stomach upset  . Hydrocodone-Acetaminophen Nausea Only  . Loratadine Other (See Comments)    Other reaction(s): Unknown  . Sulfa Antibiotics Other (See Comments)    Cannot recall  . Sulfacetamide Sodium Other (See Comments)    Cannot recall  . Sulfasalazine     Other reaction(s): Other (See Comments) Cannot recall  . Hydrocodone-Acetaminophen Nausea Only  . Penicillins Hives and Rash    Has patient had a PCN reaction causing immediate rash, facial/tongue/throat swelling, SOB or lightheadedness with hypotension: YES Has patient had a PCN reaction causing severe rash involving mucus membranes or skin necrosis: NO Has patient had a PCN reaction that required hospitalization NO Has patient had a PCN reaction occurring within the last 10 years:NO If all of the above answers are "NO", then may proceed with Cephalosporin use.     Current Outpatient Medications:  .  allopurinol (ZYLOPRIM) 100 MG tablet, TAKE 1 TABLET BY MOUTH AT  BEDTIME, Disp: 90 tablet, Rfl: 3 .  amLODipine (NORVASC) 5 MG tablet, TAKE 1 TABLET BY MOUTH  DAILY, Disp: 90 tablet, Rfl: 3 .  aspirin 81 MG  tablet, Take 81 mg by mouth every morning. , Disp: , Rfl:  .  atorvastatin (LIPITOR) 40 MG tablet, Take 1 tablet (40 mg total) by mouth daily., Disp: 90 tablet, Rfl: 3 .  clopidogrel (PLAVIX) 75 MG tablet, TAKE 1 TABLET BY MOUTH  DAILY, Disp: 90 tablet, Rfl: 2 .  furosemide (LASIX) 40 MG tablet, TAKE 1 TABLET BY MOUTH EVERY MORNING TAKE AS NEEDED FOR LEG SWELLING, Disp: , Rfl: 6 .  isosorbide mononitrate (IMDUR) 30 MG 24 hr tablet, Take 1 tablet (30 mg total) by mouth daily., Disp: 90 tablet, Rfl: 3 .  magnesium oxide (MAG-OX) 400 (241.3 Mg) MG tablet, TAKE 1 TABLET BY MOUTH TWO TIMES DAILY., Disp: 180 tablet, Rfl: 3 .  Multiple Vitamin (MULTIVITAMIN) tablet, Take 1 tablet by mouth every morning. , Disp: , Rfl:  .  nitroGLYCERIN (NITROSTAT) 0.4 MG SL tablet, Place 0.4 mg under the tongue every 5 (five) minutes as needed for chest pain (Up to 3 times)., Disp: , Rfl:  .  pantoprazole (PROTONIX) 40 MG tablet, TAKE 1 TABLET BY MOUTH  DAILY, Disp: 90 tablet, Rfl: 3 .  ranitidine (ZANTAC) 150 MG tablet, TAKE 1 TABLET BY MOUTH AT  BEDTIME, Disp: 90 tablet, Rfl: 3 .  sodium bicarbonate 650 MG tablet, Take 650 mg by mouth 2 (two) times daily., Disp: , Rfl:  .  carvedilol (  COREG) 25 MG tablet, Take 1 tablet (25 mg total) by mouth 2 (two) times daily., Disp: 180 tablet, Rfl: 3  Review of Systems  Constitutional: Negative for activity change, appetite change, chills, diaphoresis, fatigue and unexpected weight change.  HENT: Negative.   Eyes: Negative.   Respiratory: Negative for cough and shortness of breath.   Cardiovascular: Negative for chest pain, palpitations and leg swelling.  Gastrointestinal: Positive for abdominal distention and abdominal pain. Negative for anal bleeding, blood in stool, constipation, diarrhea, nausea, rectal pain and vomiting.  Endocrine: Negative.   Musculoskeletal: Negative for arthralgias and back pain.  Allergic/Immunologic: Negative.   Neurological: Negative for dizziness  and headaches.  Psychiatric/Behavioral: Negative.     Social History   Tobacco Use  . Smoking status: Former Smoker    Packs/day: 3.00    Years: 20.00    Pack years: 60.00    Types: Cigarettes    Last attempt to quit: 07/25/1986    Years since quitting: 31.5  . Smokeless tobacco: Never Used  Substance Use Topics  . Alcohol use: No    Alcohol/week: 0.0 standard drinks   Objective:   BP 132/64 (BP Location: Right Arm, Patient Position: Sitting, Cuff Size: Normal)   Pulse 68   Temp 98.2 F (36.8 C)   Resp 16   Wt 234 lb (106.1 kg)   SpO2 96%   BMI 33.58 kg/m  Vitals:   02/13/18 1454  BP: 132/64  Pulse: 68  Resp: 16  Temp: 98.2 F (36.8 C)  SpO2: 96%  Weight: 234 lb (106.1 kg)     Physical Exam  Constitutional: He is oriented to person, place, and time. He appears well-developed and well-nourished.  HENT:  Head: Normocephalic and atraumatic.  Eyes: No scleral icterus.  Cardiovascular: Normal rate, regular rhythm and intact distal pulses.  Pulmonary/Chest: Effort normal and breath sounds normal.  Abdominal: Soft. Normal appearance and bowel sounds are normal.  Trunkal obesity.  Neurological: He is alert and oriented to person, place, and time.  Skin: Skin is warm and dry.  Psychiatric: He has a normal mood and affect. His behavior is normal.        Assessment & Plan:     1. Generalized abdominal pain Everything stable. Labs/US ok.Pt feels a little better. He has f/u with GI. Stressed the need to lose weight and work on eating habits.He wishes no further w/u for now.  2. Flu vaccine need  - Flu vaccine HIGH DOSE PF (Fluzone High dose) 3.CAD All risk factors treated.  I have done the exam and reviewed the chart and it is accurate to the best of my knowledge. Development worker, community has been used and  any errors in dictation or transcription are unintentional. Miguel Aschoff M.D. [place], MD  [facility]

## 2018-03-08 DIAGNOSIS — D0439 Carcinoma in situ of skin of other parts of face: Secondary | ICD-10-CM | POA: Diagnosis not present

## 2018-03-08 DIAGNOSIS — C44321 Squamous cell carcinoma of skin of nose: Secondary | ICD-10-CM | POA: Diagnosis not present

## 2018-03-15 ENCOUNTER — Ambulatory Visit: Payer: Medicare Other

## 2018-03-15 ENCOUNTER — Encounter: Payer: Medicare Other | Admitting: Family Medicine

## 2018-03-19 ENCOUNTER — Other Ambulatory Visit: Payer: Self-pay | Admitting: Cardiovascular Disease

## 2018-03-19 DIAGNOSIS — I25700 Atherosclerosis of coronary artery bypass graft(s), unspecified, with unstable angina pectoris: Secondary | ICD-10-CM

## 2018-03-22 DIAGNOSIS — L570 Actinic keratosis: Secondary | ICD-10-CM | POA: Diagnosis not present

## 2018-03-24 DIAGNOSIS — Z872 Personal history of diseases of the skin and subcutaneous tissue: Secondary | ICD-10-CM | POA: Diagnosis not present

## 2018-04-09 ENCOUNTER — Other Ambulatory Visit: Payer: Self-pay | Admitting: Family Medicine

## 2018-04-17 ENCOUNTER — Ambulatory Visit (INDEPENDENT_AMBULATORY_CARE_PROVIDER_SITE_OTHER): Payer: Medicare Other | Admitting: Family Medicine

## 2018-04-17 ENCOUNTER — Ambulatory Visit (INDEPENDENT_AMBULATORY_CARE_PROVIDER_SITE_OTHER): Payer: Medicare Other

## 2018-04-17 VITALS — BP 144/54 | HR 72 | Temp 98.0°F | Ht 70.0 in | Wt 243.8 lb

## 2018-04-17 VITALS — BP 148/64 | HR 72 | Temp 98.0°F | Resp 16 | Wt 243.0 lb

## 2018-04-17 DIAGNOSIS — E7800 Pure hypercholesterolemia, unspecified: Secondary | ICD-10-CM

## 2018-04-17 DIAGNOSIS — I25110 Atherosclerotic heart disease of native coronary artery with unstable angina pectoris: Secondary | ICD-10-CM

## 2018-04-17 DIAGNOSIS — E1121 Type 2 diabetes mellitus with diabetic nephropathy: Secondary | ICD-10-CM

## 2018-04-17 DIAGNOSIS — J309 Allergic rhinitis, unspecified: Secondary | ICD-10-CM

## 2018-04-17 DIAGNOSIS — Z951 Presence of aortocoronary bypass graft: Secondary | ICD-10-CM

## 2018-04-17 DIAGNOSIS — Z0000 Encounter for general adult medical examination without abnormal findings: Secondary | ICD-10-CM

## 2018-04-17 DIAGNOSIS — I420 Dilated cardiomyopathy: Secondary | ICD-10-CM | POA: Diagnosis not present

## 2018-04-17 NOTE — Progress Notes (Signed)
Subjective:   Ronald [name] is a 72 y.o. male who presents for Medicare Annual/Subsequent preventive examination.  Review of Systems:  N/A  Cardiac Risk Factors include: advanced age (>33men, >78 women);diabetes mellitus;male gender;dyslipidemia;hypertension;obesity (BMI >30kg/m2)     Objective:    Vitals: BP (!) 144/54 (BP Location: Right Arm)   Pulse 72   Temp 98 F (36.7 C) (Oral)   Ht 5\' 10"  (1.778 m)   Wt 243 lb 12.8 oz (110.6 kg)   BMI 34.98 kg/m   Body mass index is 34.98 kg/m.  Advanced Directives 04/17/2018 07/27/2017 07/08/2017 01/21/2017 01/11/2017 07/13/2016 07/13/2016  Does Patient Have a Medical Advance Directive? Yes No No No No No No  Type of Advance Directive Living will - - - - - -  Does patient want to make changes to medical advance directive? - - - Yes (ED - Information included in AVS) - - -  Would patient like information on creating a medical advance directive? - No - Patient declined - - - No - Patient declined -  Pre-existing out of facility DNR order (yellow form or pink MOST form) - - - - - - -    Tobacco Social History   Tobacco Use  Smoking Status Former Smoker  . Packs/day: 3.00  . Years: 20.00  . Pack years: 60.00  . Types: Cigarettes  . Last attempt to quit: 07/25/1986  . Years since quitting: 31.7  Smokeless Tobacco Never Used     Counseling given: Not Answered   Clinical Intake:  Pre-visit preparation completed: Yes  Pain : No/denies pain Pain Score: 0-No pain    Diabetes:  Is the patient diabetic?  Yes type 2 If diabetic, was a CBG obtained today?  No  Did the patient bring in their glucometer from home?  No  How often do you monitor your CBG's? Does not.   Financial Strains and Diabetes Management:  Are you having any financial strains with the device, your supplies or your medication? Does not check blood sugar. Has no device or supplies. .  Does the patient want to be seen by Chronic Care Management for management of  their diabetes?  No  Would the patient like to be referred to a Nutritionist or for Diabetic Management?  No   Diabetic Exams:  Diabetic Eye Exam: Completed 11/24/17.   Diabetic Foot Exam: Completed 01/11/17. Pt has been advised about the importance in completing this exam. Note made to complete at next apt.   Nutritional Status: BMI > 30  Obese Nutritional Risks: None Diabetes: Yes  How often do you need to have someone help you when you read instructions, pamphlets, or other written materials from your doctor or pharmacy?: 1 - Never  Interpreter Needed?: No  Information entered by :: [facility], LPN  Past Medical History:  Diagnosis Date  . AAA (abdominal aortic aneurysm) ([place])    a. 3cm by [place] 2015.  [name] Arthritis    "hips; back" (12/13/2014)  . CAD (coronary artery disease) 2007   a. s/p CABG- IMA-LAD, VG-Cx, VG-RCA, VG-diag in 1999. B. sp redo CABG- VG-OM, VG-RCA in 2007 due to VG disease. c. NSTEMI 11/2014 s/p DES to SVG-OM from the Y graft.d. PTCA/DES x 1 distal body of SVG to Diagonal.09/2015  . Chronic combined systolic and diastolic CHF (congestive heart failure) ([place])    a. remote EF 40-45% in 2006. b. Normal EF 2014. b. Echo 07/2016 EF 45-50%, grade 1 DD.  [name] Chronic lower back pain   .  CKD (chronic kidney disease), stage IV ([place])   . COPD (chronic obstructive pulmonary disease) ([place])   . Deafness in left ear   . Degenerative disc disease, lumbar   . Dilated cardiomyopathy ([place]) 10/07/2015  . Emphysema   . Esophageal stricture 07/02/1998   EGD  . GERD (gastroesophageal reflux disease)   . History of gout    "last flareup was in 2007" (12/13/2014)  . History of hiatal hernia   . Hyperlipidemia   . Hypertension   . Ischemic cardiomyopathy 2006   EF 40% to 50% by 2D echo in 2006;  Echo 12/31/12: Mild LVH, EF 50-55%, normal wall motion.   [name] PVC's (premature ventricular contractions)   . Renal artery stenosis ([place])    a. noted on CT 2008.  . Type II diabetes mellitus ([place])      Diet control   . Walking pneumonia 1990's   Past Surgical History:  Procedure Laterality Date  . CARDIAC CATHETERIZATION  "several"  . CARDIAC CATHETERIZATION N/A 12/13/2014   Procedure: Left Heart Cath and Coronary Angiography;  Surgeon: Jettie Booze, MD;  Location: [place] [facility];  Service: Cardiovascular;  Laterality: N/A;  . CARDIAC CATHETERIZATION  12/13/2014   Procedure: Coronary Stent Intervention;  Surgeon: Jettie Booze, MD;  Location: [place] [facility];  Service: Cardiovascular;;  . CARDIAC CATHETERIZATION N/A 10/07/2015   Procedure: Left Heart Cath and Cors/Grafts Angiography;  Surgeon: Burnell Blanks, MD;  Location: [place] [facility];  Service: Cardiovascular;  Laterality: N/A;  . CARDIAC CATHETERIZATION N/A 10/07/2015   Procedure: Coronary Stent Intervention;  Surgeon: Burnell Blanks, MD;  Location: [place] [facility];  Service: Cardiovascular;  Laterality: N/A;  . CORONARY ANGIOPLASTY  "several"  . CORONARY ANGIOPLASTY WITH STENT PLACEMENT  2005; 12/13/2014   "2; 1"  . CORONARY ARTERY BYPASS GRAFT  1996   CABG X5  . CORONARY ARTERY BYPASS GRAFT  March 2007   CABG X3  . ESOPHAGOGASTRODUODENOSCOPY (EGD) WITH ESOPHAGEAL DILATION  2000  . GREEN LIGHT LASER TURP (TRANSURETHRAL RESECTION OF PROSTATE  2000's   "not cancerous"  . HERNIA REPAIR    . LAPAROSCOPIC CHOLECYSTECTOMY    . LEFT HEART CATH AND CORS/GRAFTS ANGIOGRAPHY N/A 07/28/2017   Procedure: LEFT HEART CATH AND CORS/GRAFTS ANGIOGRAPHY;  Surgeon: Troy Sine, MD;  Location: [place] [facility];  Service: Cardiovascular;  Laterality: N/A;  . LUNG SURGERY  1996   "S/P CABG, had to put staple in lung after it had collapsed"  . UMBILICAL HERNIA REPAIR     w/chole   Family History  Problem Relation Age of Onset  . Heart attack Mother        MI  . Stroke Mother   . Heart disease Mother   . Hypertension Mother   . Hyperlipidemia Mother   . Heart disease Father   . Rheumatic fever  Father   . Colon cancer Neg Hx    Social History   Socioeconomic History  . Marital status: Married    Spouse name: Not on file  . Number of children: 2  . Years of education: Not on file  . Highest education level: 8th grade  Occupational History  . Occupation: Retired  Scientific laboratory technician  . Financial resource strain: Not hard at all  . Food insecurity:    Worry: Never true    Inability: Never true  . Transportation needs:    Medical: No    Non-medical: No  Tobacco Use  .  Smoking status: Former Smoker    Packs/day: 3.00    Years: 20.00    Pack years: 60.00    Types: Cigarettes    Last attempt to quit: 07/25/1986    Years since quitting: 31.7  . Smokeless tobacco: Never Used  Substance and Sexual Activity  . Alcohol use: No    Alcohol/week: 0.0 standard drinks  . Drug use: No  . Sexual activity: Not Currently  Lifestyle  . Physical activity:    Days per week: 0 days    Minutes per session: 0 min  . Stress: Not at all  Relationships  . Social connections:    Talks on phone: Patient refused    Gets together: Patient refused    Attends religious service: Patient refused    Active member of club or organization: Patient refused    Attends meetings of clubs or organizations: Patient refused    Relationship status: Patient refused  Other Topics Concern  . Not on file  Social History Narrative   Did auto salvage work.    Outpatient Encounter Medications as of 04/17/2018  Medication Sig  . allopurinol (ZYLOPRIM) 100 MG tablet TAKE 1 TABLET BY MOUTH AT  BEDTIME  . amLODipine (NORVASC) 5 MG tablet TAKE 1 TABLET BY MOUTH  DAILY  . aspirin 81 MG tablet Take 81 mg by mouth every morning.   [name] atorvastatin (LIPITOR) 40 MG tablet Take 1 tablet (40 mg total) by mouth daily.  . carvedilol (COREG) 25 MG tablet TAKE 1 TABLET BY MOUTH TWO  TIMES DAILY (Patient taking differently: Take 25 mg by mouth daily. )  . clopidogrel (PLAVIX) 75 MG tablet TAKE 1 TABLET BY MOUTH  DAILY  .  fluticasone (FLONASE) 50 MCG/ACT nasal spray Place 2 sprays into both nostrils daily.  . furosemide (LASIX) 40 MG tablet TAKE 1 TABLET BY MOUTH EVERY MORNING TAKE AS NEEDED FOR LEG SWELLING  . isosorbide mononitrate (IMDUR) 30 MG 24 hr tablet Take 1 tablet (30 mg total) by mouth daily.  [name] loratadine (CLARITIN) 10 MG tablet Take 10 mg by mouth daily as needed for allergies.  . magnesium oxide (MAG-OX) 400 MG tablet TAKE 1 TABLET BY MOUTH TWICE A DAY  . Multiple Vitamin (MULTIVITAMIN) tablet Take 1 tablet by mouth every morning.   . nitroGLYCERIN (NITROSTAT) 0.4 MG SL tablet Place 0.4 mg under the tongue every 5 (five) minutes as needed for chest pain (Up to 3 times).  . pantoprazole (PROTONIX) 40 MG tablet TAKE 1 TABLET BY MOUTH  DAILY  . ranitidine (ZANTAC) 150 MG tablet TAKE 1 TABLET BY MOUTH AT  BEDTIME  . sodium bicarbonate 650 MG tablet Take 650 mg by mouth 2 (two) times daily.   No facility-administered encounter medications on file as of 04/17/2018.     Activities of Daily Living In your present state of health, do you have any difficulty performing the following activities: 04/17/2018 07/27/2017  Hearing? Y -  Comment Deaf in left ear. Does not wear hearing aids.  -  Vision? N -  Comment Wears eye glasses.  -  Difficulty concentrating or making decisions? N -  Walking or climbing stairs? Y -  Comment Due to back and hip pain.  -  Dressing or bathing? N -  Doing errands, shopping? N Y  Conservation officer, nature and eating ? N -  Using the Toilet? N -  In the past six months, have you accidently leaked urine? N -  Do you have problems with loss of  bowel control? N -  Managing your Medications? N -  Managing your Finances? N -  Housekeeping or managing your Housekeeping? N -  Some recent data might be hidden    Patient Care Team: [name]., MD as PCP - General (Unknown Physician Specialty) Burnell Blanks, MD as PCP - Cardiology (Cardiology) Kem Parkinson, MD as  Consulting Physician (Ophthalmology) Murlean Iba, MD as Consulting Physician (Internal Medicine) Murrell Redden, MD as Consulting Physician (Urology)   Assessment:   This is a routine wellness examination for Nakeem.  Exercise Activities and Dietary recommendations Current Exercise Habits: The patient does not participate in regular exercise at present, Exercise limited by: orthopedic condition(s)  Goals    . Cut out extra servings     Recommend to cut out all snacks and sweets after dinner to help aid in weight loss.     . Increase water intake     Starting 04/16/16, I will increase my water intake to 4-5 glasses a day.       Fall Risk Fall Risk  04/17/2018 01/23/2018 01/21/2017 04/16/2016 01/13/2015  Falls in the past year? 0 No No No No  Risk for fall due to : - - - - -  Risk for fall due to: Comment - - - - -   FALL RISK PREVENTION PERTAINING TO THE HOME:  Any stairs in or around the home WITH handrails? No  Home free of loose throw rugs in walkways, pet beds, electrical cords, etc? Yes  Adequate lighting in your home to reduce risk of falls? Yes   ASSISTIVE DEVICES UTILIZED TO PREVENT FALLS:  Life alert? No  Use of a cane, walker or w/c? Yes , has a scooter to use for long distances. Grab bars in the bathroom? No  Shower chair or bench in shower? No  Elevated toilet seat or a handicapped toilet? No    TIMED UP AND GO:  Was the test performed? No .     Depression Screen PHQ 2/9 Scores 04/17/2018 01/23/2018 01/21/2017 04/16/2016  PHQ - 2 Score 0 0 0 0  PHQ- 9 Score - - - -  Exception Documentation - - - -    Cognitive Function     6CIT Screen 04/17/2018 04/16/2016  What Year? 0 points 0 points  What month? 0 points 0 points  What time? 0 points 0 points  Count back from 20 0 points 0 points  Months in reverse 4 points 4 points  Repeat phrase 2 points 10 points  Total Score 6 14    Immunization History  Administered Date(s) Administered  . Influenza,  High Dose Seasonal PF 03/18/2015, 03/02/2016, 02/13/2018  . Influenza-Unspecified 03/14/2017  . Pneumococcal Conjugate-13 12/25/2013  . Pneumococcal Polysaccharide-23 03/04/2009  . Td 10/10/2003, 04/16/2016    Qualifies for Shingles Vaccine? No . Due for Shingrix. Education has been provided regarding the importance of this vaccine. Pt has been advised to call insurance company to determine out of pocket expense. Advised may also receive vaccine at local pharmacy or Health Dept. Verbalized acceptance and understanding.  Tdap: Up to date  Flu Vaccine: Up to date  Pneumococcal Vaccine: Up to date   Screening Tests Health Maintenance  Topic Date Due  . FOOT EXAM  01/11/2018  . URINE MICROALBUMIN  01/11/2018  . HEMOGLOBIN A1C  05/24/2018  . OPHTHALMOLOGY EXAM  11/25/2018  . COLONOSCOPY  11/19/2019  . TETANUS/TDAP  04/16/2026  . INFLUENZA VACCINE  Completed  . Hepatitis C  Screening  Completed  . PNA vac Low Risk Adult  Completed   Cancer Screenings:  Colorectal Screening: Completed 11/18/16. Repeat every 3 years.  Lung Cancer Screening: (Low Dose CT Chest recommended if Age 58-80 years, 30 pack-year currently smoking OR have quit w/in 15years.) does not qualify.    Additional Screening:  Hepatitis C Screening: Up to date  Vision Screening: Recommended annual ophthalmology exams for early detection of glaucoma and other disorders of the eye.  Dental Screening: Recommended annual dental exams for proper oral hygiene  Community Resource Referral:  CRR required this visit?  No        Plan:  I have personally reviewed and addressed the Medicare Annual Wellness questionnaire and have noted the following in the patient's chart:  A. Medical and social history B. Use of alcohol, tobacco or illicit drugs  C. Current medications and supplements D. Functional ability and status E.  Nutritional status F.  Physical activity G. Advance directives H. List of other physicians I.   Hospitalizations, surgeries, and ER visits in previous 12 months J.  Hope such as hearing and vision if needed, cognitive and depression L. Referrals and appointments - none  In addition, I have reviewed and discussed with patient certain preventive protocols, quality metrics, and best practice recommendations. A written personalized care plan for preventive services as well as general preventive health recommendations were provided to patient.  See attached scanned questionnaire for additional information.   Signed,  Fabio Neighbors, LPN Nurse Health Advisor   Nurse Recommendations: Pt needs a diabetic foot exam and urine check today.

## 2018-04-17 NOTE — Patient Instructions (Signed)
Generic Robitussin for congestion/drainage

## 2018-04-17 NOTE — Patient Instructions (Signed)
Ronald [name] , Thank you for taking time to come for your Medicare Wellness Visit. I appreciate your ongoing commitment to your health goals. Please review the following plan we discussed and let me know if I can assist you in the future.   Screening recommendations/referrals: Colonoscopy: Up to date, due 10/2019 Recommended yearly ophthalmology/optometry visit for glaucoma screening and checkup Recommended yearly dental visit for hygiene and checkup  Vaccinations: Influenza vaccine: Up to date Pneumococcal vaccine: Completed series Tdap vaccine: Up to date, due 03/2026 Shingles vaccine: Pt declines today.     Advanced directives: Please bring a copy of your POA (Power of Attorney) and/or Living Will to your next appointment.   Conditions/risks identified: Obesity- recommend to cut out all snacks and sweets after dinner to help aid in weight loss.   Next appointment: 3:30 PM with Dr [name].   Preventive Care 64 Years and Older, Male Preventive care refers to lifestyle choices and visits with your health care provider that can promote health and wellness. What does preventive care include?  A yearly physical exam. This is also called an annual well check.  Dental exams once or twice a year.  Routine eye exams. Ask your health care provider how often you should have your eyes checked.  Personal lifestyle choices, including:  Daily care of your teeth and gums.  Regular physical activity.  Eating a healthy diet.  Avoiding tobacco and drug use.  Limiting alcohol use.  Practicing safe sex.  Taking low doses of aspirin every day.  Taking vitamin and mineral supplements as recommended by your health care provider. What happens during an annual well check? The services and screenings done by your health care provider during your annual well check will depend on your age, overall health, lifestyle risk factors, and family history of disease. Counseling  Your health care  provider may ask you questions about your:  Alcohol use.  Tobacco use.  Drug use.  Emotional well-being.  Home and relationship well-being.  Sexual activity.  Eating habits.  History of falls.  Memory and ability to understand (cognition).  Work and work Statistician. Screening  You may have the following tests or measurements:  Height, weight, and BMI.  Blood pressure.  Lipid and cholesterol levels. These may be checked every 5 years, or more frequently if you are over 58 years old.  Skin check.  Lung cancer screening. You may have this screening every year starting at age 16 if you have a 30-pack-year history of smoking and currently smoke or have quit within the past 15 years.  Fecal occult blood test (FOBT) of the stool. You may have this test every year starting at age 69.  Flexible sigmoidoscopy or colonoscopy. You may have a sigmoidoscopy every 5 years or a colonoscopy every 10 years starting at age 12.  Prostate cancer screening. Recommendations will vary depending on your family history and other risks.  Hepatitis C blood test.  Hepatitis B blood test.  Sexually transmitted disease (STD) testing.  Diabetes screening. This is done by checking your blood sugar (glucose) after you have not eaten for a while (fasting). You may have this done every 1-3 years.  Abdominal aortic aneurysm (AAA) screening. You may need this if you are a current or former smoker.  Osteoporosis. You may be screened starting at age 60 if you are at high risk. Talk with your health care provider about your test results, treatment options, and if necessary, the need for more tests. Vaccines  Your  health care provider may recommend certain vaccines, such as:  Influenza vaccine. This is recommended every year.  Tetanus, diphtheria, and acellular pertussis (Tdap, Td) vaccine. You may need a Td booster every 10 years.  Zoster vaccine. You may need this after age 53.  Pneumococcal  13-valent conjugate (PCV13) vaccine. One dose is recommended after age 19.  Pneumococcal polysaccharide (PPSV23) vaccine. One dose is recommended after age 65. Talk to your health care provider about which screenings and vaccines you need and how often you need them. This information is not intended to replace advice given to you by your health care provider. Make sure you discuss any questions you have with your health care provider. Document Released: 06/13/2015 Document Revised: 02/04/2016 Document Reviewed: 03/18/2015 Elsevier Interactive Patient Education  2017 [place] Prevention in the Home Falls can cause injuries. They can happen to people of all ages. There are many things you can do to make your home safe and to help prevent falls. What can I do on the outside of my home?  Regularly fix the edges of walkways and driveways and fix any cracks.  Remove anything that might make you trip as you walk through a door, such as a raised step or threshold.  Trim any bushes or trees on the path to your home.  Use bright outdoor lighting.  Clear any walking paths of anything that might make someone trip, such as rocks or tools.  Regularly check to see if handrails are loose or broken. Make sure that both sides of any steps have handrails.  Any raised decks and porches should have guardrails on the edges.  Have any leaves, snow, or ice cleared regularly.  Use sand or salt on walking paths during winter.  Clean up any spills in your garage right away. This includes oil or grease spills. What can I do in the bathroom?  Use night lights.  Install grab bars by the toilet and in the tub and shower. Do not use towel bars as grab bars.  Use non-skid mats or decals in the tub or shower.  If you need to sit down in the shower, use a plastic, non-slip stool.  Keep the floor dry. Clean up any water that spills on the floor as soon as it happens.  Remove soap buildup in the  tub or shower regularly.  Attach bath mats securely with double-sided non-slip rug tape.  Do not have throw rugs and other things on the floor that can make you trip. What can I do in the bedroom?  Use night lights.  Make sure that you have a light by your bed that is easy to reach.  Do not use any sheets or blankets that are too big for your bed. They should not hang down onto the floor.  Have a firm chair that has side arms. You can use this for support while you get dressed.  Do not have throw rugs and other things on the floor that can make you trip. What can I do in the kitchen?  Clean up any spills right away.  Avoid walking on wet floors.  Keep items that you use a lot in easy-to-reach places.  If you need to reach something above you, use a strong step stool that has a grab bar.  Keep electrical cords out of the way.  Do not use floor polish or wax that makes floors slippery. If you must use wax, use non-skid floor wax.  Do not  have throw rugs and other things on the floor that can make you trip. What can I do with my stairs?  Do not leave any items on the stairs.  Make sure that there are handrails on both sides of the stairs and use them. Fix handrails that are broken or loose. Make sure that handrails are as long as the stairways.  Check any carpeting to make sure that it is firmly attached to the stairs. Fix any carpet that is loose or worn.  Avoid having throw rugs at the top or bottom of the stairs. If you do have throw rugs, attach them to the floor with carpet tape.  Make sure that you have a light switch at the top of the stairs and the bottom of the stairs. If you do not have them, ask someone to add them for you. What else can I do to help prevent falls?  Wear shoes that:  Do not have high heels.  Have rubber bottoms.  Are comfortable and fit you well.  Are closed at the toe. Do not wear sandals.  If you use a stepladder:  Make sure that it  is fully opened. Do not climb a closed stepladder.  Make sure that both sides of the stepladder are locked into place.  Ask someone to hold it for you, if possible.  Clearly mark and make sure that you can see:  Any grab bars or handrails.  First and last steps.  Where the edge of each step is.  Use tools that help you move around (mobility aids) if they are needed. These include:  Canes.  Walkers.  Scooters.  Crutches.  Turn on the lights when you go into a dark area. Replace any light bulbs as soon as they burn out.  Set up your furniture so you have a clear path. Avoid moving your furniture around.  If any of your floors are uneven, fix them.  If there are any pets around you, be aware of where they are.  Review your medicines with your doctor. Some medicines can make you feel dizzy. This can increase your chance of falling. Ask your doctor what other things that you can do to help prevent falls. This information is not intended to replace advice given to you by your health care provider. Make sure you discuss any questions you have with your health care provider. Document Released: 03/13/2009 Document Revised: 10/23/2015 Document Reviewed: 06/21/2014 Elsevier Interactive Patient Education  2017 [employer].

## 2018-04-17 NOTE — Progress Notes (Signed)
Ronald [name] [facility]  MRN: [id number] DOB: 12/01/1945  Subjective:  HPI   The patient is a 71 year old male who presents for follow up of his generalized abdominal pain.  He was last seen on 02/13/18.  His labs and ultrasound at that time were all ok.  He had follow up with GI and the patient stated at the time that he did not want any further work up for the issue. He reports that his symptoms are better.  He does complain today of sinus issues.  He complains of post nasal drainage.  At night he has trouble breathing because of the drainage.  No chest pain.  No true orthopnea.  It seems to be drainage that bothers him more than it is of breath.   Patient Active Problem List   Diagnosis Date Noted  . CKD (chronic kidney disease), stage IV ([place]) 07/29/2017  . Obesity 07/29/2017  . Unstable angina ([place]) 07/27/2017  . Atherosclerosis of native arteries of extremity with intermittent claudication ([place]) 06/25/2016  . Acute renal failure superimposed on stage 3 chronic kidney disease ([place]) 10/07/2015  . Dilated cardiomyopathy ([place]) 10/07/2015  . Coronary artery disease involving native coronary artery of native heart with unstable angina pectoris ([place])   . Allergic rhinitis 08/19/2015  . NSTEMI (non-ST elevated myocardial infarction) ([place]) 12/13/2014  . AA (aortic aneurysm) ([place]) 10/04/2014  . Arteriosclerosis of coronary artery 10/04/2014  . Diabetes mellitus, type 2 ([place]) 10/04/2014  . Acid reflux 10/04/2014  . 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 HLD (hyperlipidemia) 10/04/2014  . BP (high blood pressure) 10/04/2014  . Osteoarthrosis 10/04/2014  . Adult BMI 30+ 10/04/2014  . 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 Urge incontinence of urine 10/24/2012  . 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 Abdominal pain 11/09/2010  . Shortness of breath 11/12/2009  . Renal artery stenosis    . Mixed hyperlipidemia 09/04/2008  .  History of redo bypass grafting 09/04/2008  . Diabetes mellitus with nephropathy [facility])     Past Medical History:  Diagnosis Date  . AAA (abdominal aortic aneurysm) ([place])    a. 3cm by [place] 2015.  [name] Arthritis    "hips; back" (12/13/2014)  . CAD (coronary artery disease) 2007   a. s/p CABG- IMA-LAD, VG-Cx, VG-RCA, VG-diag in 1999. B. sp redo CABG- VG-OM, VG-RCA in 2007 due to VG disease. c. NSTEMI 11/2014 s/p DES to SVG-OM from the Y graft.d. PTCA/DES x 1 distal body of SVG to Diagonal.09/2015  . Chronic combined systolic and diastolic CHF (congestive heart failure) ([place])    a. remote EF 40-45% in 2006. b. Normal EF 2014. b. Echo 07/2016 EF 45-50%, grade 1 DD.  [name] Chronic lower back pain   . CKD (chronic kidney disease), stage IV ([place])   . COPD (chronic obstructive pulmonary disease) ([place])   . Deafness in left ear   . Degenerative disc disease, lumbar   . Dilated cardiomyopathy ([place]) 10/07/2015  . Emphysema   . Esophageal stricture 07/02/1998   EGD  . GERD (gastroesophageal reflux disease)   . History of gout    "last flareup was in 2007" (12/13/2014)  . History of hiatal hernia   . Hyperlipidemia   . Hypertension   . Ischemic cardiomyopathy 2006   EF 40% to 50% by 2D echo in 2006;  Echo 12/31/12: Mild LVH, EF 50-55%, normal wall motion.   [name] PVC's (premature ventricular contractions)   . Renal  artery stenosis ([place])    a. noted on CT 2008.  . Type II diabetes mellitus ([place])    Diet control   . Walking pneumonia 1990's    Social History   Socioeconomic History  . Marital status: Married    Spouse name: Not on file  . Number of children: 2  . Years of education: Not on file  . Highest education level: 8th grade  Occupational History  . Occupation: Retired  Scientific laboratory technician  . Financial resource strain: Not hard at all  . Food insecurity:    Worry: Never true    Inability: Never true  . Transportation needs:    Medical: No    Non-medical: No  Tobacco Use  . Smoking status: Former  Smoker    Packs/day: 3.00    Years: 20.00    Pack years: 60.00    Types: Cigarettes    Last attempt to quit: 07/25/1986    Years since quitting: 31.7  . Smokeless tobacco: Never Used  Substance and Sexual Activity  . Alcohol use: No    Alcohol/week: 0.0 standard drinks  . Drug use: No  . Sexual activity: Not Currently  Lifestyle  . Physical activity:    Days per week: 0 days    Minutes per session: 0 min  . Stress: Not at all  Relationships  . Social connections:    Talks on phone: Patient refused    Gets together: Patient refused    Attends religious service: Patient refused    Active member of club or organization: Patient refused    Attends meetings of clubs or organizations: Patient refused    Relationship status: Patient refused  . Intimate partner violence:    Fear of current or ex partner: Patient refused    Emotionally abused: Patient refused    Physically abused: Patient refused    Forced sexual activity: Patient refused  Other Topics Concern  . Not on file  Social History Narrative   Did auto salvage work.    Outpatient Encounter Medications as of 04/17/2018  Medication Sig  . allopurinol (ZYLOPRIM) 100 MG tablet TAKE 1 TABLET BY MOUTH AT  BEDTIME  . amLODipine (NORVASC) 5 MG tablet TAKE 1 TABLET BY MOUTH  DAILY  . aspirin 81 MG tablet Take 81 mg by mouth every morning.   [name] atorvastatin (LIPITOR) 40 MG tablet Take 1 tablet (40 mg total) by mouth daily.  . carvedilol (COREG) 25 MG tablet TAKE 1 TABLET BY MOUTH TWO  TIMES DAILY (Patient taking differently: Take 25 mg by mouth daily. )  . clopidogrel (PLAVIX) 75 MG tablet TAKE 1 TABLET BY MOUTH  DAILY  . fluticasone (FLONASE) 50 MCG/ACT nasal spray Place 2 sprays into both nostrils daily.  . furosemide (LASIX) 40 MG tablet TAKE 1 TABLET BY MOUTH EVERY MORNING TAKE AS NEEDED FOR LEG SWELLING  . isosorbide mononitrate (IMDUR) 30 MG 24 hr tablet Take 1 tablet (30 mg total) by mouth daily.  [name] loratadine (CLARITIN) 10  MG tablet Take 10 mg by mouth daily as needed for allergies.  . magnesium oxide (MAG-OX) 400 MG tablet TAKE 1 TABLET BY MOUTH TWICE A DAY  . Multiple Vitamin (MULTIVITAMIN) tablet Take 1 tablet by mouth every morning.   . nitroGLYCERIN (NITROSTAT) 0.4 MG SL tablet Place 0.4 mg under the tongue every 5 (five) minutes as needed for chest pain (Up to 3 times).  . pantoprazole (PROTONIX) 40 MG tablet TAKE 1 TABLET BY MOUTH  DAILY  .  ranitidine (ZANTAC) 150 MG tablet TAKE 1 TABLET BY MOUTH AT  BEDTIME  . sodium bicarbonate 650 MG tablet Take 650 mg by mouth 2 (two) times daily.   No facility-administered encounter medications on file as of 04/17/2018.     Allergies  Allergen Reactions  . Predicort [Prednisolone] Other (See Comments)    Stomach pain  . Ciprofloxacin     GI upset  . Hydrochlorothiazide Other (See Comments)    Dehydration  . Hydrocodone     Stomach upset  . Hydrocodone-Acetaminophen     Stomach upset  . Hydrocodone-Acetaminophen Nausea Only  . Loratadine Other (See Comments)    Other reaction(s): Unknown  . Sulfa Antibiotics Other (See Comments)    Cannot recall  . Sulfacetamide Sodium Other (See Comments)    Cannot recall  . Sulfasalazine     Other reaction(s): Other (See Comments) Cannot recall  . Hydrocodone-Acetaminophen Nausea Only  . Penicillins Hives and Rash    Has patient had a PCN reaction causing immediate rash, facial/tongue/throat swelling, SOB or lightheadedness with hypotension: YES Has patient had a PCN reaction causing severe rash involving mucus membranes or skin necrosis: NO Has patient had a PCN reaction that required hospitalization NO Has patient had a PCN reaction occurring within the last 10 years:NO If all of the above answers are "NO", then may proceed with Cephalosporin use.    Review of Systems  Constitutional: Negative.   HENT: Positive for congestion and sinus pain. Negative for ear discharge, ear pain, hearing loss, sore throat and  tinnitus.   Eyes: Negative.   Respiratory: Positive for cough (in the morning), shortness of breath and wheezing.   Cardiovascular: Negative.   Gastrointestinal: Negative.   Genitourinary: Negative.   Skin: Negative.   Neurological: Positive for headaches.  Endo/Heme/Allergies: Negative.   Psychiatric/Behavioral: Negative.     Objective:  BP (!) 148/64 (BP Location: Right Arm, Patient Position: Sitting, Cuff Size: Normal)   Pulse 72   Temp 98 F (36.7 C) (Oral)   Resp 16   Wt 243 lb (110.2 kg)   BMI 34.87 kg/m   Physical Exam  Constitutional: He is oriented to person, place, and time and well-developed, well-nourished, and in no distress.  HENT:  Head: Normocephalic and atraumatic.  Eyes: Conjunctivae are normal.  Neck: No thyromegaly present.  Cardiovascular: Normal rate, regular rhythm and normal heart sounds.  Pulmonary/Chest: Effort normal and breath sounds normal.  Abdominal: Soft. There is no tenderness.  Musculoskeletal: He exhibits no edema.  Neurological: He is alert and oriented to person, place, and time. Gait normal. GCS score is 15.  Skin: Skin is warm and dry.  Psychiatric: Mood, memory, affect and judgment normal.    Assessment and Plan :  1. Allergic rhinitis, unspecified seasonality, unspecified trigger Add BID Robitussin to try to help secretions.  2. Diabetes mellitus with nephropathy (HCC) Follow CKD.  3. Dilated cardiomyopathy ([place]) This could be very mild CHF but clinically I do not think so. Will Follow up soon.  4. Coronary artery disease involving native coronary artery of native heart with unstable angina pectoris ([place])   5. Pure hypercholesterolemia   6. History of redo bypass grafting      HPI, Exam and A&P Transcribed under the direction and in the presence of Miguel Aschoff, Brooke Bonito., MD. Electronically Signed: Althea Charon, RMA I have done the exam and reviewed the chart and it is accurate to the best of my knowledge. Risk manager has been used and  any errors in dictation or transcription are unintentional. Miguel Aschoff M.D. [facility]

## 2018-04-24 DIAGNOSIS — L570 Actinic keratosis: Secondary | ICD-10-CM | POA: Diagnosis not present

## 2018-05-15 ENCOUNTER — Ambulatory Visit (INDEPENDENT_AMBULATORY_CARE_PROVIDER_SITE_OTHER): Payer: Medicare Other | Admitting: Family Medicine

## 2018-05-15 ENCOUNTER — Encounter: Payer: Self-pay | Admitting: Family Medicine

## 2018-05-15 VITALS — BP 136/62 | HR 54 | Temp 98.1°F | Resp 20 | Ht 70.0 in | Wt 241.0 lb

## 2018-05-15 DIAGNOSIS — I714 Abdominal aortic aneurysm, without rupture, unspecified: Secondary | ICD-10-CM

## 2018-05-15 DIAGNOSIS — I420 Dilated cardiomyopathy: Secondary | ICD-10-CM | POA: Diagnosis not present

## 2018-05-15 DIAGNOSIS — I25110 Atherosclerotic heart disease of native coronary artery with unstable angina pectoris: Secondary | ICD-10-CM

## 2018-05-15 DIAGNOSIS — E119 Type 2 diabetes mellitus without complications: Secondary | ICD-10-CM | POA: Diagnosis not present

## 2018-05-15 DIAGNOSIS — E6609 Other obesity due to excess calories: Secondary | ICD-10-CM

## 2018-05-15 DIAGNOSIS — Z6834 Body mass index (BMI) 34.0-34.9, adult: Secondary | ICD-10-CM

## 2018-05-15 DIAGNOSIS — I10 Essential (primary) hypertension: Secondary | ICD-10-CM

## 2018-05-15 NOTE — Progress Notes (Signed)
Patient: [name] [place] Male    DOB: 10-Apr-1946   72 y.o.   MRN: [id number] Visit Date: 05/15/2018  Today's Provider: [name], MD   Chief Complaint  Patient presents with  . Follow-up   Subjective:     HPI Patient comes in today for a follow up. He was last seen in the office 1 month ago. It was recommended that he add Robitussin twice daily to help secretions. Patient reports that this did help some. He still has a cough in the mornings, but reports that it is much better.    Allergies  Allergen Reactions  . Predicort [Prednisolone] Other (See Comments)    Stomach pain  . Ciprofloxacin     GI upset  . Hydrochlorothiazide Other (See Comments)    Dehydration  . Hydrocodone     Stomach upset  . Hydrocodone-Acetaminophen     Stomach upset  . Hydrocodone-Acetaminophen Nausea Only  . Loratadine Other (See Comments)    Other reaction(s): Unknown  . Sulfa Antibiotics Other (See Comments)    Cannot recall  . Sulfacetamide Sodium Other (See Comments)    Cannot recall  . Sulfasalazine     Other reaction(s): Other (See Comments) Cannot recall  . Hydrocodone-Acetaminophen Nausea Only  . Penicillins Hives and Rash    Has patient had a PCN reaction causing immediate rash, facial/tongue/throat swelling, SOB or lightheadedness with hypotension: YES Has patient had a PCN reaction causing severe rash involving mucus membranes or skin necrosis: NO Has patient had a PCN reaction that required hospitalization NO Has patient had a PCN reaction occurring within the last 10 years:NO If all of the above answers are "NO", then may proceed with Cephalosporin use.     Current Outpatient Medications:  .  allopurinol (ZYLOPRIM) 100 MG tablet, TAKE 1 TABLET BY MOUTH AT  BEDTIME, Disp: 90 tablet, Rfl: 3 .  amLODipine (NORVASC) 5 MG tablet, TAKE 1 TABLET BY MOUTH  DAILY, Disp: 90 tablet, Rfl: 3 .  aspirin 81 MG tablet, Take 81 mg by mouth every morning. , Disp: , Rfl:  .   atorvastatin (LIPITOR) 40 MG tablet, Take 1 tablet (40 mg total) by mouth daily., Disp: 90 tablet, Rfl: 3 .  carvedilol (COREG) 25 MG tablet, TAKE 1 TABLET BY MOUTH TWO  TIMES DAILY (Patient taking differently: Take 25 mg by mouth daily. ), Disp: 180 tablet, Rfl: 3 .  clopidogrel (PLAVIX) 75 MG tablet, TAKE 1 TABLET BY MOUTH  DAILY, Disp: 90 tablet, Rfl: 3 .  fluticasone (FLONASE) 50 MCG/ACT nasal spray, Place 2 sprays into both nostrils daily., Disp: , Rfl:  .  furosemide (LASIX) 40 MG tablet, TAKE 1 TABLET BY MOUTH EVERY MORNING TAKE AS NEEDED FOR LEG SWELLING, Disp: , Rfl: 6 .  isosorbide mononitrate (IMDUR) 30 MG 24 hr tablet, Take 1 tablet (30 mg total) by mouth daily., Disp: 90 tablet, Rfl: 3 .  loratadine (CLARITIN) 10 MG tablet, Take 10 mg by mouth daily as needed for allergies., Disp: , Rfl:  .  magnesium oxide (MAG-OX) 400 MG tablet, TAKE 1 TABLET BY MOUTH TWICE A DAY, Disp: 180 tablet, Rfl: 3 .  Multiple Vitamin (MULTIVITAMIN) tablet, Take 1 tablet by mouth every morning. , Disp: , Rfl:  .  nitroGLYCERIN (NITROSTAT) 0.4 MG SL tablet, Place 0.4 mg under the tongue every 5 (five) minutes as needed for chest pain (Up to 3 times)., Disp: , Rfl:  .  pantoprazole (PROTONIX) 40 MG tablet,  TAKE 1 TABLET BY MOUTH  DAILY, Disp: 90 tablet, Rfl: 3 .  ranitidine (ZANTAC) 150 MG tablet, TAKE 1 TABLET BY MOUTH AT  BEDTIME, Disp: 90 tablet, Rfl: 3 .  sodium bicarbonate 650 MG tablet, Take 650 mg by mouth 2 (two) times daily., Disp: , Rfl:   Review of Systems  Constitutional: Negative.   HENT: Negative.   Eyes: Negative.   Respiratory: Positive for cough and shortness of breath.   Cardiovascular: Negative for chest pain, palpitations and leg swelling.  Gastrointestinal: Negative.   Endocrine: Negative.   Allergic/Immunologic: Negative.   Neurological: Negative for dizziness, light-headedness and headaches.  Psychiatric/Behavioral: Negative.     Social History   Tobacco Use  . Smoking status:  Former Smoker    Packs/day: 3.00    Years: 20.00    Pack years: 60.00    Types: Cigarettes    Last attempt to quit: 07/25/1986    Years since quitting: 31.8  . Smokeless tobacco: Never Used  Substance Use Topics  . Alcohol use: No    Alcohol/week: 0.0 standard drinks      Objective:   BP 136/62 (BP Location: Left Arm, Patient Position: Sitting, Cuff Size: Large)   Pulse (!) 54   Temp 98.1 F (36.7 C)   Resp 20   Ht 5\' 10"  (1.778 m)   Wt 241 lb (109.3 kg)   SpO2 96%   BMI 34.58 kg/m  Vitals:   05/15/18 1556  BP: 136/62  Pulse: (!) 54  Resp: 20  Temp: 98.1 F (36.7 C)  SpO2: 96%  Weight: 241 lb (109.3 kg)  Height: 5\' 10"  (1.778 m)     Physical Exam Constitutional:      Appearance: He is well-developed. He is obese.  HENT:     Head: Normocephalic and atraumatic.     Right Ear: External ear normal.     Left Ear: External ear normal.     Nose: Nose normal.  Eyes:     General: No scleral icterus.    Conjunctiva/sclera: Conjunctivae normal.  Neck:     Thyroid: No thyromegaly.  Cardiovascular:     Rate and Rhythm: Normal rate and regular rhythm.     Heart sounds: Normal heart sounds.  Pulmonary:     Effort: Pulmonary effort is normal.     Breath sounds: Normal breath sounds.  Abdominal:     Palpations: Abdomen is soft.     Comments: Trunk obesity  Skin:    General: Skin is warm and dry.  Neurological:     Mental Status: He is alert and oriented to person, place, and time.  Psychiatric:        Behavior: Behavior normal.        Thought Content: Thought content normal.        Judgment: Judgment normal.         Assessment & Plan    1. Dilated cardiomyopathy ([place]) Clinically stable.  2. Abdominal aortic aneurysm (AAA) without rupture ([place])   3. Essential hypertension   4. Coronary artery disease involving native coronary artery of native heart with unstable angina pectoris ([place])   5. Type 2 diabetes mellitus without complication, without  long-term current use of insulin (H[facility]) Patient now prediabetic with last A1c of 5.9.  6. Class 1 obesity due to excess calories with serious comorbidity and body mass index (BMI) of 34.0 to 34.9 in adult Patient CAD, hypertension, GERD, prediabetes  I have done the exam and reviewed the chart and  it is accurate to the best of my knowledge. Development worker, community has been used and  any errors in dictation or transcription are unintentional. Miguel Aschoff M.D. [place], MD  [facility]

## 2018-05-22 DIAGNOSIS — H353132 Nonexudative age-related macular degeneration, bilateral, intermediate dry stage: Secondary | ICD-10-CM | POA: Diagnosis not present

## 2018-05-24 ENCOUNTER — Other Ambulatory Visit: Payer: Self-pay | Admitting: Family Medicine

## 2018-06-07 ENCOUNTER — Encounter: Payer: Medicare Other | Admitting: Family Medicine

## 2018-06-10 ENCOUNTER — Inpatient Hospital Stay
Admission: EM | Admit: 2018-06-10 | Discharge: 2018-06-16 | DRG: 309 | Disposition: A | Discharge status: Home / Self Care | Payer: Medicare Other | Attending: Internal Medicine | Admitting: Internal Medicine

## 2018-06-10 ENCOUNTER — Emergency Department: Payer: Medicare Other

## 2018-06-10 ENCOUNTER — Encounter: Payer: Self-pay | Admitting: Emergency Medicine

## 2018-06-10 ENCOUNTER — Other Ambulatory Visit: Payer: Self-pay

## 2018-06-10 DIAGNOSIS — Z823 Family history of stroke: Secondary | ICD-10-CM

## 2018-06-10 DIAGNOSIS — R05 Cough: Secondary | ICD-10-CM | POA: Diagnosis not present

## 2018-06-10 DIAGNOSIS — M109 Gout, unspecified: Secondary | ICD-10-CM | POA: Diagnosis not present

## 2018-06-10 DIAGNOSIS — Z885 Allergy status to narcotic agent status: Secondary | ICD-10-CM

## 2018-06-10 DIAGNOSIS — I25709 Atherosclerosis of coronary artery bypass graft(s), unspecified, with unspecified angina pectoris: Secondary | ICD-10-CM | POA: Diagnosis present

## 2018-06-10 DIAGNOSIS — Z8349 Family history of other endocrine, nutritional and metabolic diseases: Secondary | ICD-10-CM

## 2018-06-10 DIAGNOSIS — E1122 Type 2 diabetes mellitus with diabetic chronic kidney disease: Secondary | ICD-10-CM | POA: Diagnosis not present

## 2018-06-10 DIAGNOSIS — R0902 Hypoxemia: Secondary | ICD-10-CM | POA: Diagnosis not present

## 2018-06-10 DIAGNOSIS — I499 Cardiac arrhythmia, unspecified: Secondary | ICD-10-CM | POA: Diagnosis not present

## 2018-06-10 DIAGNOSIS — J90 Pleural effusion, not elsewhere classified: Secondary | ICD-10-CM | POA: Diagnosis not present

## 2018-06-10 DIAGNOSIS — Z66 Do not resuscitate: Secondary | ICD-10-CM | POA: Diagnosis present

## 2018-06-10 DIAGNOSIS — J9811 Atelectasis: Secondary | ICD-10-CM | POA: Diagnosis not present

## 2018-06-10 DIAGNOSIS — J439 Emphysema, unspecified: Secondary | ICD-10-CM | POA: Diagnosis not present

## 2018-06-10 DIAGNOSIS — G8929 Other chronic pain: Secondary | ICD-10-CM | POA: Diagnosis present

## 2018-06-10 DIAGNOSIS — I130 Hypertensive heart and chronic kidney disease with heart failure and stage 1 through stage 4 chronic kidney disease, or unspecified chronic kidney disease: Secondary | ICD-10-CM | POA: Diagnosis present

## 2018-06-10 DIAGNOSIS — I5042 Chronic combined systolic (congestive) and diastolic (congestive) heart failure: Secondary | ICD-10-CM | POA: Diagnosis not present

## 2018-06-10 DIAGNOSIS — I208 Other forms of angina pectoris: Secondary | ICD-10-CM | POA: Diagnosis not present

## 2018-06-10 DIAGNOSIS — Z955 Presence of coronary angioplasty implant and graft: Secondary | ICD-10-CM

## 2018-06-10 DIAGNOSIS — Z9079 Acquired absence of other genital organ(s): Secondary | ICD-10-CM

## 2018-06-10 DIAGNOSIS — M160 Bilateral primary osteoarthritis of hip: Secondary | ICD-10-CM | POA: Diagnosis not present

## 2018-06-10 DIAGNOSIS — Z951 Presence of aortocoronary bypass graft: Secondary | ICD-10-CM | POA: Diagnosis not present

## 2018-06-10 DIAGNOSIS — I4891 Unspecified atrial fibrillation: Secondary | ICD-10-CM

## 2018-06-10 DIAGNOSIS — I255 Ischemic cardiomyopathy: Secondary | ICD-10-CM | POA: Diagnosis not present

## 2018-06-10 DIAGNOSIS — I420 Dilated cardiomyopathy: Secondary | ICD-10-CM | POA: Diagnosis present

## 2018-06-10 DIAGNOSIS — K219 Gastro-esophageal reflux disease without esophagitis: Secondary | ICD-10-CM | POA: Diagnosis not present

## 2018-06-10 DIAGNOSIS — K449 Diaphragmatic hernia without obstruction or gangrene: Secondary | ICD-10-CM | POA: Diagnosis present

## 2018-06-10 DIAGNOSIS — I714 Abdominal aortic aneurysm, without rupture: Secondary | ICD-10-CM | POA: Diagnosis not present

## 2018-06-10 DIAGNOSIS — I361 Nonrheumatic tricuspid (valve) insufficiency: Secondary | ICD-10-CM | POA: Diagnosis not present

## 2018-06-10 DIAGNOSIS — I200 Unstable angina: Secondary | ICD-10-CM | POA: Diagnosis not present

## 2018-06-10 DIAGNOSIS — I25119 Atherosclerotic heart disease of native coronary artery with unspecified angina pectoris: Secondary | ICD-10-CM | POA: Diagnosis present

## 2018-06-10 DIAGNOSIS — I252 Old myocardial infarction: Secondary | ICD-10-CM

## 2018-06-10 DIAGNOSIS — I2510 Atherosclerotic heart disease of native coronary artery without angina pectoris: Secondary | ICD-10-CM | POA: Diagnosis not present

## 2018-06-10 DIAGNOSIS — R079 Chest pain, unspecified: Secondary | ICD-10-CM

## 2018-06-10 DIAGNOSIS — Z9889 Other specified postprocedural states: Secondary | ICD-10-CM | POA: Diagnosis not present

## 2018-06-10 DIAGNOSIS — I4819 Other persistent atrial fibrillation: Secondary | ICD-10-CM | POA: Diagnosis not present

## 2018-06-10 DIAGNOSIS — I25118 Atherosclerotic heart disease of native coronary artery with other forms of angina pectoris: Secondary | ICD-10-CM | POA: Diagnosis not present

## 2018-06-10 DIAGNOSIS — Z882 Allergy status to sulfonamides status: Secondary | ICD-10-CM

## 2018-06-10 DIAGNOSIS — E1121 Type 2 diabetes mellitus with diabetic nephropathy: Secondary | ICD-10-CM | POA: Diagnosis present

## 2018-06-10 DIAGNOSIS — I739 Peripheral vascular disease, unspecified: Secondary | ICD-10-CM | POA: Diagnosis not present

## 2018-06-10 DIAGNOSIS — I483 Typical atrial flutter: Secondary | ICD-10-CM | POA: Diagnosis present

## 2018-06-10 DIAGNOSIS — I701 Atherosclerosis of renal artery: Secondary | ICD-10-CM | POA: Diagnosis present

## 2018-06-10 DIAGNOSIS — E785 Hyperlipidemia, unspecified: Secondary | ICD-10-CM | POA: Diagnosis not present

## 2018-06-10 DIAGNOSIS — Z881 Allergy status to other antibiotic agents status: Secondary | ICD-10-CM

## 2018-06-10 DIAGNOSIS — Z7951 Long term (current) use of inhaled steroids: Secondary | ICD-10-CM

## 2018-06-10 DIAGNOSIS — R109 Unspecified abdominal pain: Secondary | ICD-10-CM

## 2018-06-10 DIAGNOSIS — Z87891 Personal history of nicotine dependence: Secondary | ICD-10-CM

## 2018-06-10 DIAGNOSIS — Z7982 Long term (current) use of aspirin: Secondary | ICD-10-CM

## 2018-06-10 DIAGNOSIS — N179 Acute kidney failure, unspecified: Secondary | ICD-10-CM | POA: Diagnosis not present

## 2018-06-10 DIAGNOSIS — N184 Chronic kidney disease, stage 4 (severe): Secondary | ICD-10-CM | POA: Diagnosis not present

## 2018-06-10 DIAGNOSIS — I340 Nonrheumatic mitral (valve) insufficiency: Secondary | ICD-10-CM | POA: Diagnosis not present

## 2018-06-10 DIAGNOSIS — R002 Palpitations: Secondary | ICD-10-CM | POA: Diagnosis not present

## 2018-06-10 DIAGNOSIS — N183 Chronic kidney disease, stage 3 (moderate): Secondary | ICD-10-CM | POA: Diagnosis not present

## 2018-06-10 DIAGNOSIS — I4430 Unspecified atrioventricular block: Secondary | ICD-10-CM | POA: Diagnosis present

## 2018-06-10 DIAGNOSIS — R008 Other abnormalities of heart beat: Secondary | ICD-10-CM | POA: Diagnosis not present

## 2018-06-10 DIAGNOSIS — H9192 Unspecified hearing loss, left ear: Secondary | ICD-10-CM | POA: Diagnosis present

## 2018-06-10 DIAGNOSIS — Z880 Allergy status to penicillin: Secondary | ICD-10-CM

## 2018-06-10 DIAGNOSIS — I480 Paroxysmal atrial fibrillation: Principal | ICD-10-CM | POA: Diagnosis present

## 2018-06-10 DIAGNOSIS — Z888 Allergy status to other drugs, medicaments and biological substances status: Secondary | ICD-10-CM

## 2018-06-10 DIAGNOSIS — E782 Mixed hyperlipidemia: Secondary | ICD-10-CM | POA: Diagnosis present

## 2018-06-10 DIAGNOSIS — I5022 Chronic systolic (congestive) heart failure: Secondary | ICD-10-CM | POA: Diagnosis not present

## 2018-06-10 DIAGNOSIS — Z8249 Family history of ischemic heart disease and other diseases of the circulatory system: Secondary | ICD-10-CM

## 2018-06-10 DIAGNOSIS — M5136 Other intervertebral disc degeneration, lumbar region: Secondary | ICD-10-CM | POA: Diagnosis not present

## 2018-06-10 DIAGNOSIS — K222 Esophageal obstruction: Secondary | ICD-10-CM | POA: Diagnosis present

## 2018-06-10 DIAGNOSIS — R0602 Shortness of breath: Secondary | ICD-10-CM | POA: Diagnosis not present

## 2018-06-10 DIAGNOSIS — I491 Atrial premature depolarization: Secondary | ICD-10-CM | POA: Diagnosis not present

## 2018-06-10 DIAGNOSIS — Z7902 Long term (current) use of antithrombotics/antiplatelets: Secondary | ICD-10-CM

## 2018-06-10 DIAGNOSIS — I10 Essential (primary) hypertension: Secondary | ICD-10-CM | POA: Diagnosis not present

## 2018-06-10 LAB — APTT: aPTT: 29 seconds (ref 24–36)

## 2018-06-10 LAB — BASIC METABOLIC PANEL
ANION GAP: 6 (ref 5–15)
BUN: 33 mg/dL — ABNORMAL HIGH (ref 8–23)
CO2: 25 mmol/L (ref 22–32)
Calcium: 9 mg/dL (ref 8.9–10.3)
Chloride: 112 mmol/L — ABNORMAL HIGH (ref 98–111)
Creatinine, Ser: 1.93 mg/dL — ABNORMAL HIGH (ref 0.61–1.24)
GFR calc Af Amer: 39 mL/min — ABNORMAL LOW (ref >60)
GFR calc non Af Amer: 33 mL/min — ABNORMAL LOW (ref >60)
Glucose, Bld: 152 mg/dL — ABNORMAL HIGH (ref 70–99)
Potassium: 4.5 mmol/L (ref 3.5–5.1)
Sodium: 143 mmol/L (ref 135–145)

## 2018-06-10 LAB — TROPONIN I
Troponin I: <0.03 ng/mL (ref <0.03)
Troponin I: <0.03 ng/mL (ref <0.03)

## 2018-06-10 LAB — CBC
HCT: 40.5 % (ref 39.0–52.0)
Hemoglobin: 12.9 g/dL — ABNORMAL LOW (ref 13.0–17.0)
MCH: 27.7 pg (ref 26.0–34.0)
MCHC: 31.9 g/dL (ref 30.0–36.0)
MCV: 87.1 fL (ref 80.0–100.0)
PLATELETS: 147 10*3/uL — AB (ref 150–400)
RBC: 4.65 MIL/uL (ref 4.22–5.81)
RDW: 14.7 % (ref 11.5–15.5)
WBC: 7 10*3/uL (ref 4.0–10.5)
nRBC: 0 % (ref 0.0–0.2)

## 2018-06-10 LAB — PROTIME-INR
INR: 0.95
Prothrombin Time: 12.6 seconds (ref 11.4–15.2)

## 2018-06-10 LAB — HEPARIN LEVEL (UNFRACTIONATED): Heparin Unfractionated: 0.44 IU/mL (ref 0.30–0.70)

## 2018-06-10 LAB — GLUCOSE, CAPILLARY: GLUCOSE-CAPILLARY: 117 mg/dL — AB (ref 70–99)

## 2018-06-10 LAB — MAGNESIUM: Magnesium: 2.2 mg/dL (ref 1.7–2.4)

## 2018-06-10 MED ORDER — MAGNESIUM SULFATE 2 GM/50ML IV SOLN
2.0000 g | Freq: Once | INTRAVENOUS | Status: AC
  Administered 2018-06-10: 2 g via INTRAVENOUS
  Filled 2018-06-10: qty 50

## 2018-06-10 MED ORDER — HEPARIN BOLUS VIA INFUSION
5000.0000 [IU] | Freq: Once | INTRAVENOUS | Status: AC
  Administered 2018-06-10: 5000 [IU] via INTRAVENOUS
  Filled 2018-06-10: qty 5000

## 2018-06-10 MED ORDER — FAMOTIDINE 20 MG PO TABS
20.0000 mg | ORAL_TABLET | Freq: Every day | ORAL | Status: DC
  Administered 2018-06-10 – 2018-06-16 (×7): 20 mg via ORAL
  Filled 2018-06-10 (×7): qty 1

## 2018-06-10 MED ORDER — AMLODIPINE BESYLATE 5 MG PO TABS
5.0000 mg | ORAL_TABLET | Freq: Every day | ORAL | Status: DC
  Administered 2018-06-11: 5 mg via ORAL
  Filled 2018-06-10: qty 1

## 2018-06-10 MED ORDER — LORATADINE 10 MG PO TABS: 10.0000 mg | ORAL_TABLET | Freq: Every day | ORAL | Status: DC | PRN

## 2018-06-10 MED ORDER — ACETAMINOPHEN 650 MG RE SUPP: 650.0000 mg | Freq: Four times a day (QID) | RECTAL | Status: DC | PRN

## 2018-06-10 MED ORDER — ASPIRIN EC 81 MG PO TBEC
81.0000 mg | DELAYED_RELEASE_TABLET | Freq: Every morning | ORAL | Status: DC
  Administered 2018-06-11: 81 mg via ORAL
  Filled 2018-06-10 (×2): qty 1

## 2018-06-10 MED ORDER — MAGNESIUM OXIDE 400 (241.3 MG) MG PO TABS
400.0000 mg | ORAL_TABLET | Freq: Two times a day (BID) | ORAL | Status: DC
  Administered 2018-06-10 – 2018-06-16 (×12): 400 mg via ORAL
  Filled 2018-06-10 (×12): qty 1

## 2018-06-10 MED ORDER — SODIUM CHLORIDE 0.9% FLUSH: 3.0000 mL | INTRAVENOUS | Status: DC | PRN

## 2018-06-10 MED ORDER — NITROGLYCERIN 0.4 MG SL SUBL: 0.4000 mg | SUBLINGUAL_TABLET | SUBLINGUAL | Status: DC | PRN

## 2018-06-10 MED ORDER — SODIUM CHLORIDE 0.9% FLUSH
3.0000 mL | Freq: Two times a day (BID) | INTRAVENOUS | Status: DC
  Administered 2018-06-10 – 2018-06-16 (×8): 3 mL via INTRAVENOUS

## 2018-06-10 MED ORDER — ISOSORBIDE MONONITRATE ER 30 MG PO TB24
30.0000 mg | ORAL_TABLET | Freq: Every day | ORAL | Status: DC
  Administered 2018-06-11 – 2018-06-16 (×6): 30 mg via ORAL
  Filled 2018-06-10 (×6): qty 1

## 2018-06-10 MED ORDER — ALLOPURINOL 100 MG PO TABS
100.0000 mg | ORAL_TABLET | Freq: Every day | ORAL | Status: DC
  Administered 2018-06-10 – 2018-06-15 (×6): 100 mg via ORAL
  Filled 2018-06-10 (×7): qty 1

## 2018-06-10 MED ORDER — DILTIAZEM HCL 25 MG/5ML IV SOLN
5.0000 mg | Freq: Four times a day (QID) | INTRAVENOUS | Status: DC | PRN
  Administered 2018-06-11 – 2018-06-12 (×2): 5 mg via INTRAVENOUS
  Filled 2018-06-10 (×3): qty 5

## 2018-06-10 MED ORDER — HEPARIN (PORCINE) 25000 UT/250ML-% IV SOLN
1400.0000 [IU]/h | INTRAVENOUS | Status: DC
  Administered 2018-06-10 – 2018-06-11 (×2): 1400 [IU]/h via INTRAVENOUS
  Filled 2018-06-10 (×2): qty 250

## 2018-06-10 MED ORDER — ATORVASTATIN CALCIUM 20 MG PO TABS
40.0000 mg | ORAL_TABLET | Freq: Every day | ORAL | Status: DC
  Administered 2018-06-10 – 2018-06-15 (×6): 40 mg via ORAL
  Filled 2018-06-10 (×6): qty 2

## 2018-06-10 MED ORDER — ACETAMINOPHEN 325 MG PO TABS
650.0000 mg | ORAL_TABLET | Freq: Four times a day (QID) | ORAL | Status: DC | PRN
  Administered 2018-06-11: 650 mg via ORAL
  Filled 2018-06-10: qty 2

## 2018-06-10 MED ORDER — ONDANSETRON HCL 4 MG PO TABS: 4.0000 mg | ORAL_TABLET | Freq: Four times a day (QID) | ORAL | Status: DC | PRN

## 2018-06-10 MED ORDER — ADULT MULTIVITAMIN W/MINERALS CH
1.0000 | ORAL_TABLET | Freq: Every morning | ORAL | Status: DC
  Administered 2018-06-11 – 2018-06-16 (×6): 1 via ORAL
  Filled 2018-06-10 (×6): qty 1

## 2018-06-10 MED ORDER — ONDANSETRON HCL 4 MG/2ML IJ SOLN: 4.0000 mg | Freq: Four times a day (QID) | INTRAMUSCULAR | Status: DC | PRN

## 2018-06-10 MED ORDER — FLUTICASONE PROPIONATE 50 MCG/ACT NA SUSP
2.0000 | Freq: Every day | NASAL | Status: DC
  Administered 2018-06-12 – 2018-06-16 (×5): 2 via NASAL
  Filled 2018-06-10: qty 16

## 2018-06-10 MED ORDER — CARVEDILOL 25 MG PO TABS
25.0000 mg | ORAL_TABLET | Freq: Two times a day (BID) | ORAL | Status: DC
  Administered 2018-06-10 – 2018-06-11 (×2): 25 mg via ORAL
  Filled 2018-06-10 (×2): qty 1

## 2018-06-10 MED ORDER — SODIUM BICARBONATE 650 MG PO TABS
650.0000 mg | ORAL_TABLET | Freq: Two times a day (BID) | ORAL | Status: DC
  Administered 2018-06-10 – 2018-06-16 (×12): 650 mg via ORAL
  Filled 2018-06-10 (×13): qty 1

## 2018-06-10 MED ORDER — PANTOPRAZOLE SODIUM 40 MG PO TBEC
40.0000 mg | DELAYED_RELEASE_TABLET | Freq: Every day | ORAL | Status: DC
  Administered 2018-06-11 – 2018-06-16 (×6): 40 mg via ORAL
  Filled 2018-06-10 (×6): qty 1

## 2018-06-10 MED ORDER — DILTIAZEM HCL ER COATED BEADS 180 MG PO CP24
180.0000 mg | ORAL_CAPSULE | Freq: Once | ORAL | Status: AC
  Administered 2018-06-10: 180 mg via ORAL
  Filled 2018-06-10: qty 1

## 2018-06-10 NOTE — Progress Notes (Signed)
ANTICOAGULATION CONSULT NOTE - Initial Consult  Pharmacy Consult for heparin Indication: atrial fibrillation  Allergies  Allergen Reactions  . Predicort [Prednisolone] Other (See Comments)    Stomach pain  . Ciprofloxacin     GI upset  . Hydrochlorothiazide Other (See Comments)    Dehydration  . Hydrocodone     Stomach upset  . Hydrocodone-Acetaminophen     Stomach upset  . Hydrocodone-Acetaminophen Nausea Only  . Loratadine Other (See Comments)    Other reaction(s): Unknown  . Sulfa Antibiotics Other (See Comments)    Cannot recall  . Sulfacetamide Sodium Other (See Comments)    Cannot recall  . Sulfasalazine     Other reaction(s): Other (See Comments) Cannot recall  . Hydrocodone-Acetaminophen Nausea Only  . Penicillins Hives and Rash    Has patient had a PCN reaction causing immediate rash, facial/tongue/throat swelling, SOB or lightheadedness with hypotension: YES Has patient had a PCN reaction causing severe rash involving mucus membranes or skin necrosis: NO Has patient had a PCN reaction that required hospitalization NO Has patient had a PCN reaction occurring within the last 10 years:NO If all of the above answers are "NO", then may proceed with Cephalosporin use.    Patient Measurements: Height: 5\' 10"  (177.8 cm) Weight: 240 lb 3.2 oz (109 kg) IBW/kg (Calculated) : 73 Heparin Dosing Weight: 95.9 kg  Vital Signs: Temp: 97.7 F (36.5 C) (01/11 1912) Temp Source: Oral (01/11 1912) BP: 137/77 (01/11 1912) Pulse Rate: 71 (01/11 1912)  Labs: Recent Labs    06/10/18 1230 06/10/18 1434 06/10/18 1833 06/10/18 2152  HGB 12.9*  --   --   --   HCT 40.5  --   --   --   PLT 147*  --   --   --   APTT  --  29  --   --   LABPROT  --  12.6  --   --   INR  --  0.95  --   --   HEPARINUNFRC  --   --   --  0.44  CREATININE 1.93*  --   --   --   TROPONINI <0.03  --  <0.03 <0.03    Estimated Creatinine Clearance: 41.5 mL/min (A) (by C-G formula based on SCr of 1.93  mg/dL (H)).   Medical History: Past Medical History:  Diagnosis Date  . AAA (abdominal aortic aneurysm) ([place])    a. 3cm by [place] 2015.  [name] Arthritis    "hips; back" (12/13/2014)  . CAD (coronary artery disease) 2007   a. s/p CABG- IMA-LAD, VG-Cx, VG-RCA, VG-diag in 1999. B. sp redo CABG- VG-OM, VG-RCA in 2007 due to VG disease. c. NSTEMI 11/2014 s/p DES to SVG-OM from the Y graft.d. PTCA/DES x 1 distal body of SVG to Diagonal.09/2015  . Chronic combined systolic and diastolic CHF (congestive heart failure) ([place])    a. remote EF 40-45% in 2006. b. Normal EF 2014. b. Echo 07/2016 EF 45-50%, grade 1 DD.  [name] Chronic lower back pain   . CKD (chronic kidney disease), stage IV ([place])   . COPD (chronic obstructive pulmonary disease) ([place])   . Deafness in left ear   . Degenerative disc disease, lumbar   . Dilated cardiomyopathy ([place]) 10/07/2015  . Emphysema   . Esophageal stricture 07/02/1998   EGD  . GERD (gastroesophageal reflux disease)   . History of gout    "last flareup was in 2007" (12/13/2014)  . History of hiatal hernia   .  Hyperlipidemia   . Hypertension   . Ischemic cardiomyopathy 2006   EF 40% to 50% by 2D echo in 2006;  Echo 12/31/12: Mild LVH, EF 50-55%, normal wall motion.   [name] PVC's (premature ventricular contractions)   . Renal artery stenosis ([place])    a. noted on CT 2008.  . Type II diabetes mellitus ([place])    Diet control   . Walking pneumonia 1990's    Medications:  Infusions:  . heparin 1,400 Units/hr (06/10/18 1622)    Assessment: 72 yom cc palpitations with extensive cardiac history. AF noted in ED and pharmacy consulted to dose heparin. PTA med list is not complete but OP cardiology notes were reviewed and no PTA OAC noted. Will continue to follow and complete PTA list.   Goal of Therapy:  Heparin level 0.3-0.7 units/ml Monitor platelets by anticoagulation protocol: Yes   Plan:  Give 5000 units bolus x 1 Start heparin infusion at 1400 units/hr Check anti-Xa level in  8 hours and daily while on heparin Continue to monitor H&H and platelets   1/11 PM heparin level 0.44. Continue current regimen. Recheck heparin level and CBC with tomorrow AM labs.  Eloise Harman, PharmD, BCPS Clinical Pharmacist 06/10/2018,11:27 PM

## 2018-06-10 NOTE — ED Notes (Signed)
Afib on monitor, VSS family at bedside. Family at bedside.

## 2018-06-10 NOTE — ED Provider Notes (Signed)
[facility] Emergency Department Provider Note  ____________________________________________  Time seen: Approximately 3:18 PM  I have reviewed the triage vital signs and the nursing notes.   HISTORY  Chief Complaint Palpitations   HPI Ronald [name] is a 73 y.o. male with a history of CAD status post CABG, CKD, COPD, AAA, dilated cardiomyopathy, hypertension, hyperlipidemia, DM who presents for evaluation of CP and palpitations.  Patient reports over the last 3 days he has had several episodes of chest pain that he describes as pressure in the center of his chest associated with shortness of breath.  The episodes last several minutes at a time.  Usually brought on by mild exertion.  Patient also reports chronic shortness of breath however that has been worse over the last several weeks.  Especially with minimal exertion.  This morning after having an episode of chest pain patient started feeling his heart pounding in his chest.  He took full dose aspirin and 3 sublingual nitros with resolution of the pain however the palpitations remained.  Patient denies any prior history of atrial fibrillation or dysrhythmias.  No personal or family history of blood clots, recent travel immobilization, leg pain or swelling, hemoptysis, or exogenous hormones.  No history of cancer.  At this time patient denies any chest pain.   Past Medical History:  Diagnosis Date  . AAA (abdominal aortic aneurysm) ([place])    a. 3cm by [place] 2015.  [name] Arthritis    "hips; back" (12/13/2014)  . CAD (coronary artery disease) 2007   a. s/p CABG- IMA-LAD, VG-Cx, VG-RCA, VG-diag in 1999. B. sp redo CABG- VG-OM, VG-RCA in 2007 due to VG disease. c. NSTEMI 11/2014 s/p DES to SVG-OM from the Y graft.d. PTCA/DES x 1 distal body of SVG to Diagonal.09/2015  . Chronic combined systolic and diastolic CHF (congestive heart failure) ([place])    a. remote EF 40-45% in 2006. b. Normal EF 2014. b. Echo 07/2016 EF 45-50%,  grade 1 DD.  [name] Chronic lower back pain   . CKD (chronic kidney disease), stage IV ([place])   . COPD (chronic obstructive pulmonary disease) ([place])   . Deafness in left ear   . Degenerative disc disease, lumbar   . Dilated cardiomyopathy ([place]) 10/07/2015  . Emphysema   . Esophageal stricture 07/02/1998   EGD  . GERD (gastroesophageal reflux disease)   . History of gout    "last flareup was in 2007" (12/13/2014)  . History of hiatal hernia   . Hyperlipidemia   . Hypertension   . Ischemic cardiomyopathy 2006   EF 40% to 50% by 2D echo in 2006;  Echo 12/31/12: Mild LVH, EF 50-55%, normal wall motion.   [name] PVC's (premature ventricular contractions)   . Renal artery stenosis ([place])    a. noted on CT 2008.  . Type II diabetes mellitus ([place])    Diet control   . Walking pneumonia 1990's    Patient Active Problem List   Diagnosis Date Noted  . A-fib ([place]) 06/10/2018  . CKD (chronic kidney disease), stage IV ([place]) 07/29/2017  . Obesity 07/29/2017  . Unstable angina ([place]) 07/27/2017  . Atherosclerosis of native arteries of extremity with intermittent claudication ([place]) 06/25/2016  . Acute renal failure superimposed on stage 3 chronic kidney disease ([place]) 10/07/2015  . Dilated cardiomyopathy ([place]) 10/07/2015  . Coronary artery disease involving native coronary artery of native heart with unstable angina pectoris ([place])   . Allergic rhinitis 08/19/2015  . NSTEMI (non-ST elevated myocardial  infarction) ([place]) 12/13/2014  . AA (aortic aneurysm) ([place]) 10/04/2014  . Arteriosclerosis of coronary artery 10/04/2014  . Diabetes mellitus, type 2 ([place]) 10/04/2014  . Acid reflux 10/04/2014  . 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 HLD (hyperlipidemia) 10/04/2014  . BP (high blood pressure) 10/04/2014  . Osteoarthrosis 10/04/2014  . Adult BMI 30+ 10/04/2014  . 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 Urge incontinence of urine 10/24/2012  . Benign prostatic  hyperplasia with urinary obstruction 10/24/2012  . Abdominal pain 11/09/2010  . Shortness of breath 11/12/2009  . Renal artery stenosis    . Mixed hyperlipidemia 09/04/2008  . History of redo bypass grafting 09/04/2008  . Diabetes mellitus with nephropathy [facility])     Past Surgical History:  Procedure Laterality Date  . CARDIAC CATHETERIZATION  "several"  . CARDIAC CATHETERIZATION N/A 12/13/2014   Procedure: Left Heart Cath and Coronary Angiography;  Surgeon: Jettie Booze, MD;  Location: [place] CV LAB;  Service: Cardiovascular;  Laterality: N/A;  . CARDIAC CATHETERIZATION  12/13/2014   Procedure: Coronary Stent Intervention;  Surgeon: Jettie Booze, MD;  Location: [place] CV LAB;  Service: Cardiovascular;;  . CARDIAC CATHETERIZATION N/A 10/07/2015   Procedure: Left Heart Cath and Cors/Grafts Angiography;  Surgeon: Burnell Blanks, MD;  [facility] [place] [facility];  Service: Cardiovascular;  Laterality: N/A;  . CARDIAC CATHETERIZATION N/A 10/07/2015   Procedure: Coronary Stent Intervention;  Surgeon: Burnell Blanks, MD;  Location: [place] [facility] LAB;  Service: Cardiovascular;  Laterality: N/A;  . CORONARY ANGIOPLASTY  "several"  . CORONARY ANGIOPLASTY WITH STENT PLACEMENT  2005; 12/13/2014   "2; 1"  . CORONARY ARTERY BYPASS GRAFT  1996   CABG X5  . CORONARY ARTERY BYPASS GRAFT  March 2007   CABG X3  . ESOPHAGOGASTRODUODENOSCOPY (EGD) WITH ESOPHAGEAL DILATION  2000  . GREEN LIGHT LASER TURP (TRANSURETHRAL RESECTION OF PROSTATE  2000's   "not cancerous"  . HERNIA REPAIR    . LAPAROSCOPIC CHOLECYSTECTOMY    . LEFT HEART CATH AND CORS/GRAFTS ANGIOGRAPHY N/A 07/28/2017   Procedure: LEFT HEART CATH AND CORS/GRAFTS ANGIOGRAPHY;  Surgeon: Troy Sine, MD;  Location: [place] CV LAB;  Service: Cardiovascular;  Laterality: N/A;  . LUNG SURGERY  1996   "S/P CABG, had to put staple in lung after it had collapsed"  . UMBILICAL HERNIA REPAIR     w/chole     Prior to Admission medications   Medication Sig Start Date End Date Taking? Authorizing Provider  allopurinol (ZYLOPRIM) 100 MG tablet TAKE 1 TABLET BY MOUTH AT  BEDTIME Patient taking differently: Take 100 mg by mouth at bedtime.  01/10/18  Yes [name]., MD  amLODipine (NORVASC) 5 MG tablet TAKE 1 TABLET BY MOUTH  DAILY 05/26/18  Yes [name]., MD  aspirin 81 MG tablet Take 81 mg by mouth every morning.    Yes [provider]  atorvastatin (LIPITOR) 40 MG tablet Take 1 tablet (40 mg total) by mouth daily. Patient taking differently: Take 40 mg by mouth every evening.  12/30/17  Yes Burnell Blanks, MD  carvedilol (COREG) 25 MG tablet TAKE 1 TABLET BY MOUTH TWO  TIMES DAILY Patient taking differently: Take 25 mg by mouth 2 (two) times daily.  03/20/18  Yes Burnell Blanks, MD  clopidogrel (PLAVIX) 75 MG tablet TAKE 1 TABLET BY MOUTH  DAILY Patient taking differently: Take 75 mg by mouth daily.  03/20/18  Yes Burnell Blanks, MD  furosemide (LASIX) 40 MG tablet Take 40 mg by mouth daily as needed for fluid or edema.  05/05/17  Yes [provider]  isosorbide mononitrate (IMDUR) 30 MG 24 hr tablet Take 1 tablet (30 mg total) by mouth daily. 09/05/17  Yes Burnell Blanks, MD  loratadine (CLARITIN) 10 MG tablet Take 10 mg by mouth daily as needed for allergies.   Yes [provider]  magnesium oxide (MAG-OX) 400 MG tablet TAKE 1 TABLET BY MOUTH TWICE A DAY Patient taking differently: Take 400 mg by mouth 2 (two) times daily.  04/10/18  Yes [name]., MD  Multiple Vitamin (MULTIVITAMIN) tablet Take 1 tablet by mouth every morning.    Yes [provider]  nitroGLYCERIN (NITROSTAT) 0.4 MG SL tablet Place 0.4 mg under the tongue every 5 (five) minutes as needed for chest pain (Up to 3 times).   Yes [provider]  pantoprazole (PROTONIX) 40 MG tablet TAKE 1 TABLET BY MOUTH  DAILY Patient  taking differently: Take 40 mg by mouth daily.  05/26/18  Yes [name]., MD  ranitidine (ZANTAC) 150 MG tablet TAKE 1 TABLET BY MOUTH AT  BEDTIME Patient taking differently: Take 150 mg by mouth at bedtime.  05/26/18  Yes [name]., MD  sodium bicarbonate 650 MG tablet Take 650 mg by mouth 2 (two) times daily.   Yes [provider]    Allergies Predicort [prednisolone]; Ciprofloxacin; Hydrochlorothiazide; Hydrocodone; Hydrocodone-acetaminophen; Hydrocodone-acetaminophen; Loratadine; Sulfa antibiotics; Sulfacetamide sodium; Sulfasalazine; Hydrocodone-acetaminophen; and Penicillins  Family History  Problem Relation Age of Onset  . Heart attack Mother        MI  . Stroke Mother   . Heart disease Mother   . Hypertension Mother   . Hyperlipidemia Mother   . Heart disease Father   . Rheumatic fever Father   . Colon cancer Neg Hx     Social History Social History   Tobacco Use  . Smoking status: Former Smoker    Packs/day: 3.00    Years: 20.00    Pack years: 60.00    Types: Cigarettes    Last attempt to quit: 07/25/1986    Years since quitting: 31.8  . Smokeless tobacco: Never Used  Substance Use Topics  . Alcohol use: No    Alcohol/week: 0.0 standard drinks  . Drug use: No    Review of Systems  Constitutional: Negative for fever. Eyes: Negative for visual changes. ENT: Negative for sore throat. Neck: No neck pain  Cardiovascular: + chest pain, palpitations Respiratory: + shortness of breath. Gastrointestinal: Negative for abdominal pain, vomiting or diarrhea. Genitourinary: Negative for dysuria. Musculoskeletal: Negative for back pain. Skin: Negative for rash. Neurological: Negative for headaches, weakness or numbness. Psych: No SI or HI  ____________________________________________   PHYSICAL EXAM:  VITAL SIGNS: ED Triage Vitals  Enc Vitals Group     BP 06/10/18 1300 118/73     Pulse Rate 06/10/18 1245 (!) 102     Resp  06/10/18 1245 17     Temp --      Temp src --      SpO2 06/10/18 1226 96 %     Weight 06/10/18 1423 235 lb (106.6 kg)     Height --      Head Circumference --      Peak Flow --      Pain Score 06/10/18 1229 0     Pain Loc --  Pain Edu? --      Excl. in [place]? --     Constitutional: Alert and oriented. Well appearing and in no apparent distress. HEENT:      Head: Normocephalic and atraumatic.         Eyes: Conjunctivae are normal. Sclera is non-icteric.       Mouth/Throat: Mucous membranes are moist.       Neck: Supple with no signs of meningismus. Cardiovascular: Irregularly irregular rhythm with normal rate regular rate and rhythm. No murmurs, gallops, or rubs. 2+ symmetrical distal pulses are present in all extremities. No JVD. Respiratory: Normal respiratory effort. Lungs are clear to auscultation bilaterally. No wheezes, crackles, or rhonchi.  Gastrointestinal: Soft, non tender, and non distended with positive bowel sounds. No rebound or guarding. Musculoskeletal: Nontender with normal range of motion in all extremities. No edema, cyanosis, or erythema of extremities. Neurologic: Normal speech and language. Face is symmetric. Moving all extremities. No gross focal neurologic deficits are appreciated. Skin: Skin is warm, dry and intact. No rash noted. Psychiatric: Mood and affect are normal. Speech and behavior are normal.  ____________________________________________   LABS (all labs ordered are listed, but only abnormal results are displayed)  Labs Reviewed  BASIC METABOLIC PANEL - Abnormal; Notable for the following components:      Result Value   Chloride 112 (*)    Glucose, Bld 152 (*)    BUN 33 (*)    Creatinine, Ser 1.93 (*)    GFR calc non Af Amer 33 (*)    GFR calc Af Amer 39 (*)    All other components within normal limits  CBC - Abnormal; Notable for the following components:   Hemoglobin 12.9 (*)    Platelets 147 (*)    All other components within normal  limits  TROPONIN I  MAGNESIUM  APTT  PROTIME-INR  HEPARIN LEVEL (UNFRACTIONATED)   ____________________________________________  EKG  ED ECG REPORT I, Rudene Re, the attending physician, personally viewed and interpreted this ECG.  Atrial fibrillation, rate of 97, normal QTC, normal axis, occasional PVCs, no ST elevations or depressions.  New when compared to prior  ____________________________________________  RADIOLOGY  I have personally reviewed the images performed during this visit and I agree with the Radiologist's read.   Interpretation by Radiologist:  Dg Chest 2 View  Result Date: 06/10/2018 CLINICAL DATA:  Cough for 3 months. Shortness of breath, chest pain, and palpitations. Coronary artery disease. EXAM: CHEST - 2 VIEW COMPARISON:  07/26/2017 FINDINGS: The heart size and mediastinal contours are within normal limits. Aortic atherosclerosis. Prior CABG again noted. Stable mild bibasilar scarring. No evidence of pulmonary infiltrate or edema. No evidence of pleural effusion. The visualized skeletal structures are unremarkable. IMPRESSION: Stable bibasilar scarring.  No active cardiopulmonary disease. Electronically Signed   By: Earle Gell M.D.   On: 06/10/2018 12:56     ____________________________________________   PROCEDURES  Procedure(s) performed: None Procedures Critical Care performed: yes  CRITICAL CARE Performed by: Rudene Re  ?  Total critical care time: 30 min  Critical care time was exclusive of separately billable procedures and treating other patients.  Critical care was necessary to treat or prevent imminent or life-threatening deterioration.  Critical care was time spent personally by me on the following activities: development of treatment plan with patient and/or surrogate as well as nursing, discussions with consultants, evaluation of patient's response to treatment, examination of patient, obtaining history from patient or  surrogate, ordering and performing treatments and interventions,  ordering and review of laboratory studies, ordering and review of radiographic studies, pulse oximetry and re-evaluation of patient's condition.  ____________________________________________   INITIAL IMPRESSION / ASSESSMENT AND PLAN / ED COURSE  73 y.o. male with a history of CAD status post CABG, CKD, COPD, AAA, dilated cardiomyopathy, hypertension, hyperlipidemia, DM who presents for evaluation of CP and palpitations.  Patient with new onset of atrial fibrillation with well-controlled rate in the setting of several days of intermittent chest pain.  No chest pain at this time.  Initial EKG with no evidence of ischemia.  Presentation concerning for unstable angina.  Patient given p.o. Cardizem to help maintain rate control.  Started on IV heparin.  Initial troponin negative.  Labs showing no acute findings.  Patient was discussed with Dr. Tressia Miners for admission.      As part of my medical decision making, I reviewed the following data within the [place] History obtained from family, Nursing notes reviewed and incorporated, Labs reviewed , EKG interpreted , Old EKG reviewed, Old chart reviewed, Radiograph reviewed , Discussed with admitting physician , Notes from prior ED visits and [place] Controlled Substance Database    Pertinent labs & imaging results that were available during my care of the patient were reviewed by me and considered in my medical decision making (see chart for details).    ____________________________________________   FINAL CLINICAL IMPRESSION(S) / ED DIAGNOSES  Final diagnoses:  Atrial fibrillation, unspecified type ([place])  Chest pain, unspecified type  Unstable angina ([place])      NEW MEDICATIONS STARTED DURING THIS VISIT:  ED Discharge Orders    None       Note:  This document was prepared using Dragon voice recognition software and may include unintentional dictation  errors.    [name] Levins, [place], MD 06/10/18 [phone]

## 2018-06-10 NOTE — Progress Notes (Signed)
Admitted from the ED with chest pain and new onset Afib.  Afib converted to SR at about 1500. Since then he has been in SR with period (about 2 minutes a time) Trigeminy PVCs.  Asymptomatic during these periods.

## 2018-06-10 NOTE — H&P (Signed)
[facility] [place] NAME: Ronald [name]    MR#:  [id number]  DATE OF BIRTH:  04-Jun-1945  DATE OF ADMISSION:  06/10/2018  PRIMARY CARE PHYSICIAN: [name]., MD   REQUESTING/REFERRING PHYSICIAN: Dr. Rudene Re  CHIEF COMPLAINT:   Chief Complaint  Patient presents with  . Palpitations    HISTORY OF PRESENT ILLNESS:  Ronald [name]  is a 73 y.o. male with a known history of CAD status post CABG in 1999 and 2007, stents placed in 2017, [name], history of ischemic cardiomyopathy, [name], [name] Patient states he was doing well up until a month ago, and started having extremely low energy levels and fatigue.  Has chronic shortness of breath not on home oxygen.  Has been having intermittent chest pressures with minimal exertion that relieved with rest.  However when he woke up this morning, he felt sick and tired.  He sat down and started feeling significant palpitations associated with nausea, diaphoresis and chest pressure.  He says these symptoms were completely different from what he has experienced before.  He had a home blood pressure monitor that showed elevated blood pressure and heart rate in the 140s.  No previous diagnosis of atrial fibrillation or arrhythmias though he was noted to have skipped beats and PVCs in the past.  He came to the hospital by EMS.  EKG showing atrial fibrillation, first troponin is negative.  Received oral Cardizem and heart rate is better and symptomatically he feels much improved.  PAST MEDICAL HISTORY:   Past Medical History:  Diagnosis Date  . AAA (abdominal aortic aneurysm) ([place])    a. 3cm by [place] 2015.  [name] Arthritis    "hips; back" (12/13/2014)  . CAD (coronary artery disease) 2007   a. s/p CABG- IMA-LAD, VG-Cx, VG-RCA,  VG-diag in 1999. B. sp redo CABG- VG-OM, VG-RCA in 2007 due to VG disease. c. NSTEMI 11/2014 s/p DES to SVG-OM from the Y graft.d. PTCA/DES x 1 distal body of SVG to Diagonal.09/2015  . Chronic combined systolic and diastolic CHF (congestive heart failure) ([place])    a. remote EF 40-45% in 2006. b. Normal EF 2014. b. Echo 07/2016 EF 45-50%, grade 1 DD.  [name] Chronic lower back pain   . CKD (chronic kidney disease), stage IV ([place])   . COPD (chronic obstructive pulmonary disease) ([place])   . Deafness in left ear   . Degenerative disc disease, lumbar   . Dilated cardiomyopathy ([place]) 10/07/2015  . Emphysema   . Esophageal stricture 07/02/1998   EGD  . GERD (gastroesophageal reflux disease)   . History of gout    "last flareup was in 2007" (12/13/2014)  . History of hiatal hernia   . Hyperlipidemia   . Hypertension   . Ischemic cardiomyopathy 2006   EF 40% to 50% by 2D echo in 2006;  Echo 12/31/12: Mild LVH, EF 50-55%, normal wall motion.   [name] PVC's (premature ventricular contractions)   . Renal artery stenosis ([place])    a. noted on CT 2008.  . Type II diabetes mellitus ([place])    Diet control   . Walking pneumonia 1990's    PAST SURGICAL HISTORY:   Past Surgical History:  Procedure Laterality Date  . CARDIAC CATHETERIZATION  "several"  . CARDIAC CATHETERIZATION N/A 12/13/2014  Procedure: Left Heart Cath and Coronary Angiography;  Surgeon: Jettie Booze, MD;  Location: [facility];  Service: Cardiovascular;  Laterality: N/A;  . CARDIAC CATHETERIZATION  12/13/2014   Procedure: Coronary Stent Intervention;  Surgeon: Jettie Booze, MD;  Location: [facility];  Service: Cardiovascular;;  . CARDIAC CATHETERIZATION N/A 10/07/2015   Procedure: Left Heart Cath and Cors/Grafts Angiography;  Surgeon: Burnell Blanks, MD;  Location: [facility];  Service: Cardiovascular;  Laterality: N/A;  . CARDIAC CATHETERIZATION N/A 10/07/2015   Procedure: Coronary Stent Intervention;   Surgeon: Burnell Blanks, MD;  Location: [facility];  Service: Cardiovascular;  Laterality: N/A;  . CORONARY ANGIOPLASTY  "several"  . CORONARY ANGIOPLASTY WITH STENT PLACEMENT  2005; 12/13/2014   "2; 1"  . CORONARY ARTERY BYPASS GRAFT  1996   CABG X5  . CORONARY ARTERY BYPASS GRAFT  March 2007   CABG X3  . ESOPHAGOGASTRODUODENOSCOPY (EGD) WITH ESOPHAGEAL DILATION  2000  . GREEN LIGHT LASER TURP (TRANSURETHRAL RESECTION OF PROSTATE  2000's   "not cancerous"  . HERNIA REPAIR    . LAPAROSCOPIC CHOLECYSTECTOMY    . LEFT HEART CATH AND CORS/GRAFTS ANGIOGRAPHY N/A 07/28/2017   Procedure: LEFT HEART CATH AND CORS/GRAFTS ANGIOGRAPHY;  Surgeon: Troy Sine, MD;  Location: [facility];  Service: Cardiovascular;  Laterality: N/A;  . LUNG SURGERY  1996   "S/P CABG, had to put staple in lung after it had collapsed"  . UMBILICAL HERNIA REPAIR     w/chole    SOCIAL HISTORY:   Social History   Tobacco Use  . Smoking status: Former Smoker    Packs/day: 3.00    Years: 20.00    Pack years: 60.00    Types: Cigarettes    Last attempt to quit: 07/25/1986    Years since quitting: 31.8  . Smokeless tobacco: Never Used  Substance Use Topics  . Alcohol use: No    Alcohol/week: 0.0 standard drinks    FAMILY HISTORY:   Family History  Problem Relation Age of Onset  . Heart attack Mother        MI  . Stroke Mother   . Heart disease Mother   . Hypertension Mother   . Hyperlipidemia Mother   . Heart disease Father   . Rheumatic fever Father   . Colon cancer Neg Hx     DRUG ALLERGIES:   Allergies  Allergen Reactions  . Predicort [Prednisolone] Other (See Comments)    Stomach pain  . Ciprofloxacin     GI upset  . Hydrochlorothiazide Other (See Comments)    Dehydration  . Hydrocodone     Stomach upset  . Hydrocodone-Acetaminophen     Stomach upset  . Hydrocodone-Acetaminophen Nausea Only  . Loratadine Other (See Comments)    Other reaction(s): Unknown  .  Sulfa Antibiotics Other (See Comments)    Cannot recall  . Sulfacetamide Sodium Other (See Comments)    Cannot recall  . Sulfasalazine     Other reaction(s): Other (See Comments) Cannot recall  . Hydrocodone-Acetaminophen Nausea Only  . Penicillins Hives and Rash    Has patient had a PCN reaction causing immediate rash, facial/tongue/throat swelling, SOB or lightheadedness with hypotension: YES Has patient had a PCN reaction causing severe rash involving mucus membranes or skin necrosis: NO Has patient had a PCN reaction that required hospitalization NO Has patient had a PCN reaction occurring within the last 10 years:NO If all of the above  answers are "NO", then may proceed with Cephalosporin use.    REVIEW OF SYSTEMS:   Review of Systems  Constitutional: Positive for malaise/fatigue. Negative for chills, fever and weight loss.  HENT: Negative for ear discharge, ear pain, hearing loss and nosebleeds.   Eyes: Negative for blurred vision, double vision and photophobia.  Respiratory: Positive for shortness of breath. Negative for cough, hemoptysis and wheezing.   Cardiovascular: Positive for chest pain and palpitations. Negative for orthopnea and leg swelling.  Gastrointestinal: Negative for abdominal pain, constipation, diarrhea, heartburn, melena, nausea and vomiting.  Genitourinary: Negative for dysuria, frequency, hematuria and urgency.  Musculoskeletal: Negative for back pain, myalgias and neck pain.  Skin: Negative for rash.  Neurological: Negative for dizziness, tingling, tremors, sensory change, speech change, focal weakness and headaches.  Endo/Heme/Allergies: Does not bruise/bleed easily.  Psychiatric/Behavioral: Negative for depression.    MEDICATIONS AT HOME:   Prior to Admission medications   Medication Sig Start Date End Date Taking? Authorizing Provider  allopurinol (ZYLOPRIM) 100 MG tablet TAKE 1 TABLET BY MOUTH AT  BEDTIME 01/10/18   [name]., MD   amLODipine (NORVASC) 5 MG tablet TAKE 1 TABLET BY MOUTH  DAILY 05/26/18   [name]., MD  aspirin 81 MG tablet Take 81 mg by mouth every morning.     [provider]  atorvastatin (LIPITOR) 40 MG tablet Take 1 tablet (40 mg total) by mouth daily. 12/30/17   Burnell Blanks, MD  carvedilol (COREG) 25 MG tablet TAKE 1 TABLET BY MOUTH TWO  TIMES DAILY Patient taking differently: Take 25 mg by mouth daily.  03/20/18   Burnell Blanks, MD  clopidogrel (PLAVIX) 75 MG tablet TAKE 1 TABLET BY MOUTH  DAILY 03/20/18   Burnell Blanks, MD  fluticasone (FLONASE) 50 MCG/ACT nasal spray Place 2 sprays into both nostrils daily.    [provider]  furosemide (LASIX) 40 MG tablet TAKE 1 TABLET BY MOUTH EVERY MORNING TAKE AS NEEDED FOR LEG SWELLING 05/05/17   [provider]  isosorbide mononitrate (IMDUR) 30 MG 24 hr tablet Take 1 tablet (30 mg total) by mouth daily. 09/05/17   Burnell Blanks, MD  loratadine (CLARITIN) 10 MG tablet Take 10 mg by mouth daily as needed for allergies.    [provider]  magnesium oxide (MAG-OX) 400 MG tablet TAKE 1 TABLET BY MOUTH TWICE A DAY 04/10/18   [name]., MD  Multiple Vitamin (MULTIVITAMIN) tablet Take 1 tablet by mouth every morning.     [provider]  nitroGLYCERIN (NITROSTAT) 0.4 MG SL tablet Place 0.4 mg under the tongue every 5 (five) minutes as needed for chest pain (Up to 3 times).    [provider]  pantoprazole (PROTONIX) 40 MG tablet TAKE 1 TABLET BY MOUTH  DAILY 05/26/18   [name]., MD  ranitidine (ZANTAC) 150 MG tablet TAKE 1 TABLET BY MOUTH AT  BEDTIME 05/26/18   [name]., MD  sodium bicarbonate 650 MG tablet Take 650 mg by mouth 2 (two) times daily.    [provider]      VITAL SIGNS:  Blood pressure 125/70, pulse 99, resp. rate 15, SpO2 97 %.  PHYSICAL EXAMINATION:   Physical Exam  GENERAL:  73  y.o.-year-old patient lying in the bed with no acute distress.  EYES: Pupils equal, round, reactive to light and accommodation. No scleral icterus. Extraocular muscles intact.  HEENT: Head atraumatic, normocephalic. Oropharynx and  nasopharynx clear.  NECK:  Supple, no jugular venous distention. No thyroid enlargement, no tenderness.  LUNGS: Normal breath sounds bilaterally, no wheezing, rales,rhonchi or crepitation. No use of accessory muscles of respiration.  Decreased bibasilar breath sounds CARDIOVASCULAR: S1, S2 irregularly irregular. No murmurs, rubs, or gallops.  ABDOMEN: Soft, obese abdomen, nontender, nondistended. Bowel sounds present. No organomegaly or mass.  EXTREMITIES: No pedal edema, cyanosis, or clubbing.  NEUROLOGIC: Cranial nerves II through XII are intact. Muscle strength 5/5 in all extremities. Sensation intact. Gait not checked.  PSYCHIATRIC: The patient is alert and oriented x 3.  SKIN: No obvious rash, lesion, or ulcer.   LABORATORY PANEL:   CBC Recent Labs  Lab 06/10/18 1230  WBC 7.0  HGB 12.9*  HCT 40.5  PLT 147*   ------------------------------------------------------------------------------------------------------------------  Chemistries  Recent Labs  Lab 06/10/18 1230  NA 143  K 4.5  CL 112*  CO2 25  GLUCOSE 152*  BUN 33*  CREATININE 1.93*  CALCIUM 9.0  MG 2.2   ------------------------------------------------------------------------------------------------------------------  Cardiac Enzymes Recent Labs  Lab 06/10/18 1230  TROPONINI <0.03   ------------------------------------------------------------------------------------------------------------------  RADIOLOGY:  Dg Chest 2 View  Result Date: 06/10/2018 CLINICAL DATA:  Cough for 3 months. Shortness of breath, chest pain, and palpitations. Coronary artery disease. EXAM: CHEST - 2 VIEW COMPARISON:  07/26/2017 FINDINGS: The heart size and mediastinal contours are within normal limits.  Aortic atherosclerosis. Prior CABG again noted. Stable mild bibasilar scarring. No evidence of pulmonary infiltrate or edema. No evidence of pleural effusion. The visualized skeletal structures are unremarkable. IMPRESSION: Stable bibasilar scarring.  No active cardiopulmonary disease. Electronically Signed   By: Earle Gell M.D.   On: 06/10/2018 12:56    EKG:   Orders placed or performed during the hospital encounter of 06/10/18  . ED EKG within 10 minutes  . ED EKG within 10 minutes  . EKG 12-Lead  . EKG 12-Lead    IMPRESSION AND PLAN:   Kennet Mccort  is a 73 y.o. male with a known history of CAD status post CABG in 1999 and 2007, stents placed in 2017, [name], history of ischemic cardiomyopathy, [name], [name]  1.  New onset A. fib-admit to telemetry, heart rate is better controlled with oral Cardizem. -We will do IV Cardizem pushes as needed.  Continue his carvedilol -Cardiology consult.  Troponins -Echocardiogram -Started on heparin drip  2.  CAD-with stable angina symptoms -Cardiology consult, troponins. -Most recent cardiac catheterization was from February  2018 -Continue cardiac medications except Plavix as being started on heparin drip  3.  Hypertension-on Coreg, Imdur, norvasc  4.  CKD stage III- IV-creatinine seems to be stable at baseline.  He follows with Dr. Candiss Norse as outpatient.  If needs a cardiac cath, can consider nephrology consultation if needed. -Continue sodium bicarb  5.  GERD-Protonix  6.  DVT prophylaxis-already on heparin drip  Family updated at bedside   All the records are reviewed and case discussed with ED provider. Management plans discussed with the patient, family and they are in agreement.  CODE STATUS: Full code  TOTAL TIME TAKING CARE OF THIS PATIENT: 55 minutes.     Gladstone Lighter M.D on 06/10/2018 at 2:19 PM  Between 7am to 6pm - Pager - [phone]  After 6pm go to [email or web address] - password [email or web address]  [employer]  [phone]  CC: Primary care physician; [name]., MD

## 2018-06-10 NOTE — ED Triage Notes (Signed)
Pt to ED via [facility] for palpitations, per EMS pt has extensive cardiac hx. Pt was A fib on the monitor with rate between 80's-140's. Pt states that when palpitations started he some chest pressure in the center of his chest. Pt reports that it lasted a few minutes then went away. Pt states that he was having shortness of breath but this is normal for him.

## 2018-06-10 NOTE — ED Notes (Signed)
Patient transported to X-ray 

## 2018-06-10 NOTE — Progress Notes (Signed)
ANTICOAGULATION CONSULT NOTE - Initial Consult  Pharmacy Consult for heparin Indication: atrial fibrillation  Allergies  Allergen Reactions  . Predicort [Prednisolone] Other (See Comments)    Stomach pain  . Ciprofloxacin     GI upset  . Hydrochlorothiazide Other (See Comments)    Dehydration  . Hydrocodone     Stomach upset  . Hydrocodone-Acetaminophen     Stomach upset  . Hydrocodone-Acetaminophen Nausea Only  . Loratadine Other (See Comments)    Other reaction(s): Unknown  . Sulfa Antibiotics Other (See Comments)    Cannot recall  . Sulfacetamide Sodium Other (See Comments)    Cannot recall  . Sulfasalazine     Other reaction(s): Other (See Comments) Cannot recall  . Hydrocodone-Acetaminophen Nausea Only  . Penicillins Hives and Rash    Has patient had a PCN reaction causing immediate rash, facial/tongue/throat swelling, SOB or lightheadedness with hypotension: YES Has patient had a PCN reaction causing severe rash involving mucus membranes or skin necrosis: NO Has patient had a PCN reaction that required hospitalization NO Has patient had a PCN reaction occurring within the last 10 years:NO If all of the above answers are "NO", then may proceed with Cephalosporin use.    Patient Measurements: Weight: 235 lb (106.6 kg) Heparin Dosing Weight: 95.9 kg  Vital Signs: BP: 125/70 (01/11 1330) Pulse Rate: 99 (01/11 1345)  Labs: Recent Labs    06/10/18 1230  HGB 12.9*  HCT 40.5  PLT 147*  CREATININE 1.93*  TROPONINI <0.03    Estimated Creatinine Clearance: 41 mL/min (A) (by C-G formula based on SCr of 1.93 mg/dL (H)).   Medical History: Past Medical History:  Diagnosis Date  . AAA (abdominal aortic aneurysm) ([place])    a. 3cm by [place] 2015.  [name] Arthritis    "hips; back" (12/13/2014)  . CAD (coronary artery disease) 2007   a. s/p CABG- IMA-LAD, VG-Cx, VG-RCA, VG-diag in 1999. B. sp redo CABG- VG-OM, VG-RCA in 2007 due to VG disease. c. NSTEMI 11/2014 s/p DES to  SVG-OM from the Y graft.d. PTCA/DES x 1 distal body of SVG to Diagonal.09/2015  . Chronic combined systolic and diastolic CHF (congestive heart failure) ([place])    a. remote EF 40-45% in 2006. b. Normal EF 2014. b. Echo 07/2016 EF 45-50%, grade 1 DD.  [name] Chronic lower back pain   . CKD (chronic kidney disease), stage IV ([place])   . COPD (chronic obstructive pulmonary disease) ([place])   . Deafness in left ear   . Degenerative disc disease, lumbar   . Dilated cardiomyopathy ([place]) 10/07/2015  . Emphysema   . Esophageal stricture 07/02/1998   EGD  . GERD (gastroesophageal reflux disease)   . History of gout    "last flareup was in 2007" (12/13/2014)  . History of hiatal hernia   . Hyperlipidemia   . Hypertension   . Ischemic cardiomyopathy 2006   EF 40% to 50% by 2D echo in 2006;  Echo 12/31/12: Mild LVH, EF 50-55%, normal wall motion.   [name] PVC's (premature ventricular contractions)   . Renal artery stenosis ([place])    a. noted on CT 2008.  . Type II diabetes mellitus ([place])    Diet control   . Walking pneumonia 1990's    Medications:  Infusions:  . heparin      Assessment: 72 yom cc palpitations with extensive cardiac history. AF noted in ED and pharmacy consulted to dose heparin. PTA med list is not complete but OP cardiology notes were reviewed and  no PTA OAC noted. Will continue to follow and complete PTA list.   Goal of Therapy:  Heparin level 0.3-0.7 units/ml Monitor platelets by anticoagulation protocol: Yes   Plan:  Give 5000 units bolus x 1 Start heparin infusion at 1400 units/hr Check anti-Xa level in 8 hours and daily while on heparin Continue to monitor H&H and platelets  Laural Benes, PharmD, BCPS Clinical Pharmacist 06/10/2018,2:28 PM

## 2018-06-11 DIAGNOSIS — N184 Chronic kidney disease, stage 4 (severe): Secondary | ICD-10-CM

## 2018-06-11 LAB — CBC
HCT: 40.1 % (ref 39.0–52.0)
Hemoglobin: 12.7 g/dL — ABNORMAL LOW (ref 13.0–17.0)
MCH: 27.7 pg (ref 26.0–34.0)
MCHC: 31.7 g/dL (ref 30.0–36.0)
MCV: 87.4 fL (ref 80.0–100.0)
Platelets: 139 10*3/uL — ABNORMAL LOW (ref 150–400)
RBC: 4.59 MIL/uL (ref 4.22–5.81)
RDW: 14.7 % (ref 11.5–15.5)
WBC: 7.8 10*3/uL (ref 4.0–10.5)
nRBC: 0 % (ref 0.0–0.2)

## 2018-06-11 LAB — GLUCOSE, CAPILLARY
GLUCOSE-CAPILLARY: 109 mg/dL — AB (ref 70–99)
Glucose-Capillary: 136 mg/dL — ABNORMAL HIGH (ref 70–99)

## 2018-06-11 LAB — BASIC METABOLIC PANEL
Anion gap: 9 (ref 5–15)
BUN: 32 mg/dL — ABNORMAL HIGH (ref 8–23)
CALCIUM: 8.9 mg/dL (ref 8.9–10.3)
CO2: 23 mmol/L (ref 22–32)
Chloride: 111 mmol/L (ref 98–111)
Creatinine, Ser: 2.17 mg/dL — ABNORMAL HIGH (ref 0.61–1.24)
GFR calc Af Amer: 34 mL/min — ABNORMAL LOW (ref >60)
GFR, EST NON AFRICAN AMERICAN: 29 mL/min — AB (ref >60)
Glucose, Bld: 116 mg/dL — ABNORMAL HIGH (ref 70–99)
Potassium: 4 mmol/L (ref 3.5–5.1)
Sodium: 143 mmol/L (ref 135–145)

## 2018-06-11 LAB — TSH: TSH: 2.491 u[IU]/mL (ref 0.350–4.500)

## 2018-06-11 LAB — HEPARIN LEVEL (UNFRACTIONATED): HEPARIN UNFRACTIONATED: 0.48 [IU]/mL (ref 0.30–0.70)

## 2018-06-11 LAB — TROPONIN I

## 2018-06-11 MED ORDER — RIVAROXABAN 15 MG PO TABS
15.0000 mg | ORAL_TABLET | Freq: Every day | ORAL | Status: DC
  Administered 2018-06-11 – 2018-06-15 (×5): 15 mg via ORAL
  Filled 2018-06-11 (×6): qty 1

## 2018-06-11 MED ORDER — DILTIAZEM HCL-DEXTROSE 100-5 MG/100ML-% IV SOLN (PREMIX)
5.0000 mg/h | INTRAVENOUS | Status: DC
  Administered 2018-06-11 – 2018-06-12 (×2): 5 mg/h via INTRAVENOUS
  Filled 2018-06-11 (×2): qty 100

## 2018-06-11 MED ORDER — METOPROLOL TARTRATE 5 MG/5ML IV SOLN
10.0000 mg | Freq: Once | INTRAVENOUS | Status: AC
  Administered 2018-06-11: 10 mg via INTRAVENOUS
  Filled 2018-06-11: qty 10

## 2018-06-11 MED ORDER — METOPROLOL TARTRATE 5 MG/5ML IV SOLN
5.0000 mg | Freq: Once | INTRAVENOUS | Status: AC
  Administered 2018-06-11: 5 mg via INTRAVENOUS
  Filled 2018-06-11: qty 5

## 2018-06-11 MED ORDER — DILTIAZEM HCL 25 MG/5ML IV SOLN
5.0000 mg | Freq: Once | INTRAVENOUS | Status: AC
  Administered 2018-06-11: 5 mg via INTRAVENOUS

## 2018-06-11 MED ORDER — ADENOSINE 6 MG/2ML IV SOLN
6.0000 mg | Freq: Once | INTRAVENOUS | Status: AC
  Administered 2018-06-11: 6 mg via INTRAVENOUS
  Filled 2018-06-11: qty 2

## 2018-06-11 MED ORDER — METOPROLOL TARTRATE 50 MG PO TABS
50.0000 mg | ORAL_TABLET | Freq: Two times a day (BID) | ORAL | Status: DC
  Administered 2018-06-11 – 2018-06-15 (×9): 50 mg via ORAL
  Filled 2018-06-11 (×9): qty 1

## 2018-06-11 NOTE — Progress Notes (Addendum)
Pt called and reported he felt his HR was fluttering. On assessment pt HR 146-148. IV Cardizem 5 mg PRN. Will continue to monitor.  Update 0147: Dr. Jannifer Franklin was notified about HR still sustaining at 145 after cardizem IV administration. Dr. Jannifer Franklin ordered STAT EKG. Will continue to monitor.  Update 0208: Pt HR still sustaining at 144. Dr. Jannifer Franklin ordered Cardizem 5 mg IV once. Will continue to monitor.  Update 0242: Pt oxygen on RA 90. Pt was place on 2 liter oxygen 0245 and sating now at 95 %. Will continue to monitor.  Update 0252: Pt HR still sustaining 144. Dr. Marcille Blanco ordered 5 mg IV metoprolol Once. Will continue to monitor.   Update 0423:Pt HR still sustaining at 144-146. Pt was not on any distress. Notify prime. Will continue to monitor.  Update 0428: Dr. Marcille Blanco ordered adenosine (adenocard) 6 mg/2 ml once. Will continue to monitor.  Update 0450: Adenosine was pushed and HR down at 10-118. Will continue to monitor.   Update 0510: Pt HR back at 146. Dr. Marcille Blanco was aware and just ordered to keep monitor pt. Will continue to monitor.  Update 0556: HR still sustaining at 145. Dr. Marcille Blanco ordered metropolol 10 mg IV once. Will continue to monitor.

## 2018-06-11 NOTE — Progress Notes (Signed)
Paged MD regarding stopping of cardizem at 1500 with a HR of 60s-70s, and that pt converted to SR.

## 2018-06-11 NOTE — Consult Note (Signed)
Cardiology Consultation:   Patient ID: Ronald [name] [facility] MRN: [id number]; DOB: 08/28/1945  Admit date: 06/10/2018 Date of Consult: 06/11/2018  Primary Care Provider: [name]., MD Primary Cardiologist: Lauree Chandler, MD  Primary Electrophysiologist:  None    Patient Profile:   Ronald [name] is a 73 y.o. male with a hx of CAD s/p CABG, ischemic CM, CRI, stage IV, ad COPE who is being seen today for the evaluation of new onset atrial fibrillation and typical atrial flutter at the request of Dr Darvin Neighbours.  History of Present Illness:   [name] presents with symptoms of feeling unwell for a week.  He reports fatigue and palpitations.  Yesterday, he noticed that his heart was beating very fast and presented for further evaluation.  He was noted to be in atrial fibrillation.  He was placed on IV heparin and diltiazem.  He has converted to atrial flutter.  V rates have improved.  He reports feeling a little better this am. He denies chest pain to me, though he did report having chest pain per admit H&P.  He did not rule in for MI and is currently chest pain free.  He has chronic SOB which is attributed to COPD. He denies presyncope, syncope, fevers, chills, cough, or other new concerns.  Past Medical History:  Diagnosis Date  . AAA (abdominal aortic aneurysm) ([place])    a. 3cm by [place] 2015.  [name] Arthritis    "hips; back" (12/13/2014)  . CAD (coronary artery disease) 2007   a. s/p CABG- IMA-LAD, VG-Cx, VG-RCA, VG-diag in 1999. B. sp redo CABG- VG-OM, VG-RCA in 2007 due to VG disease. c. NSTEMI 11/2014 s/p DES to SVG-OM from the Y graft.d. PTCA/DES x 1 distal body of SVG to Diagonal.09/2015  . Chronic combined systolic and diastolic CHF (congestive heart failure) ([place])    a. remote EF 40-45% in 2006. b. Normal EF 2014. b. Echo 07/2016 EF 45-50%, grade 1 DD.  [name] Chronic lower back pain   . CKD (chronic kidney disease), stage IV ([place])   . COPD (chronic obstructive pulmonary disease)  ([place])   . Deafness in left ear   . Degenerative disc disease, lumbar   . Dilated cardiomyopathy ([place]) 10/07/2015  . Emphysema   . Esophageal stricture 07/02/1998   EGD  . GERD (gastroesophageal reflux disease)   . History of gout    "last flareup was in 2007" (12/13/2014)  . History of hiatal hernia   . Hyperlipidemia   . Hypertension   . Ischemic cardiomyopathy 2006   EF 40% to 50% by 2D echo in 2006;  Echo 12/31/12: Mild LVH, EF 50-55%, normal wall motion.   [name] PVC's (premature ventricular contractions)   . Renal artery stenosis ([place])    a. noted on CT 2008.  . Type II diabetes mellitus ([place])    Diet control   . Walking pneumonia 1990's    Past Surgical History:  Procedure Laterality Date  . CARDIAC CATHETERIZATION  "several"  . CARDIAC CATHETERIZATION N/A 12/13/2014   Procedure: Left Heart Cath and Coronary Angiography;  Surgeon: Jettie Booze, MD;  Location: [facility];  Service: Cardiovascular;  Laterality: N/A;  . CARDIAC CATHETERIZATION  12/13/2014   Procedure: Coronary Stent Intervention;  Surgeon: Jettie Booze, MD;  Location: [facility];  Service: Cardiovascular;;  . CARDIAC CATHETERIZATION N/A 10/07/2015   Procedure: Left Heart Cath and Cors/Grafts Angiography;  Surgeon: Burnell Blanks, MD;  Location: [facility];  Service: Cardiovascular;  Laterality: N/A;  . CARDIAC CATHETERIZATION N/A 10/07/2015   Procedure: Coronary Stent Intervention;  Surgeon: Burnell Blanks, MD;  Location: [facility];  Service: Cardiovascular;  Laterality: N/A;  . CORONARY ANGIOPLASTY  "several"  . CORONARY ANGIOPLASTY WITH STENT PLACEMENT  2005; 12/13/2014   "2; 1"  . CORONARY ARTERY BYPASS GRAFT  1996   CABG X5  . CORONARY ARTERY BYPASS GRAFT  March 2007   CABG X3  . ESOPHAGOGASTRODUODENOSCOPY (EGD) WITH ESOPHAGEAL DILATION  2000  . GREEN LIGHT LASER TURP (TRANSURETHRAL RESECTION OF PROSTATE  2000's   "not cancerous"  . HERNIA REPAIR    .  LAPAROSCOPIC CHOLECYSTECTOMY    . LEFT HEART CATH AND CORS/GRAFTS ANGIOGRAPHY N/A 07/28/2017   Procedure: LEFT HEART CATH AND CORS/GRAFTS ANGIOGRAPHY;  Surgeon: Troy Sine, MD;  Location: [facility];  Service: Cardiovascular;  Laterality: N/A;  . LUNG SURGERY  1996   "S/P CABG, had to put staple in lung after it had collapsed"  . UMBILICAL HERNIA REPAIR     w/chole     Home Medications:  Prior to Admission medications   Medication Sig Start Date End Date Taking? Authorizing Provider  allopurinol (ZYLOPRIM) 100 MG tablet TAKE 1 TABLET BY MOUTH AT  BEDTIME Patient taking differently: Take 100 mg by mouth at bedtime.  01/10/18  Yes [name]., MD  amLODipine (NORVASC) 5 MG tablet TAKE 1 TABLET BY MOUTH  DAILY 05/26/18  Yes [name]., MD  aspirin 81 MG tablet Take 81 mg by mouth every morning.    Yes [provider]  atorvastatin (LIPITOR) 40 MG tablet Take 1 tablet (40 mg total) by mouth daily. Patient taking differently: Take 40 mg by mouth every evening.  12/30/17  Yes Burnell Blanks, MD  carvedilol (COREG) 25 MG tablet TAKE 1 TABLET BY MOUTH TWO  TIMES DAILY Patient taking differently: Take 25 mg by mouth 2 (two) times daily.  03/20/18  Yes Burnell Blanks, MD  clopidogrel (PLAVIX) 75 MG tablet TAKE 1 TABLET BY MOUTH  DAILY Patient taking differently: Take 75 mg by mouth daily.  03/20/18  Yes Burnell Blanks, MD  furosemide (LASIX) 40 MG tablet Take 40 mg by mouth daily as needed for fluid or edema.  05/05/17  Yes [provider]  isosorbide mononitrate (IMDUR) 30 MG 24 hr tablet Take 1 tablet (30 mg total) by mouth daily. 09/05/17  Yes Burnell Blanks, MD  loratadine (CLARITIN) 10 MG tablet Take 10 mg by mouth daily as needed for allergies.   Yes [provider]  magnesium oxide (MAG-OX) 400 MG tablet TAKE 1 TABLET BY MOUTH TWICE A DAY Patient taking differently: Take 400 mg by mouth 2 (two) times  daily.  04/10/18  Yes [name]., MD  Multiple Vitamin (MULTIVITAMIN) tablet Take 1 tablet by mouth every morning.    Yes [provider]  nitroGLYCERIN (NITROSTAT) 0.4 MG SL tablet Place 0.4 mg under the tongue every 5 (five) minutes as needed for chest pain (Up to 3 times).   Yes [provider]  pantoprazole (PROTONIX) 40 MG tablet TAKE 1 TABLET BY MOUTH  DAILY Patient taking differently: Take 40 mg by mouth daily.  05/26/18  Yes [name]., MD  ranitidine (ZANTAC) 150 MG tablet TAKE 1 TABLET BY MOUTH AT  BEDTIME Patient taking differently: Take 150 mg by mouth at bedtime.  05/26/18  Yes Eulas Post  Brooke Bonito., MD  sodium bicarbonate 650 MG tablet Take 650 mg by mouth 2 (two) times daily.   Yes [provider]    Inpatient Medications: Scheduled Meds: . allopurinol  100 mg Oral QHS  . aspirin EC  81 mg Oral q morning - 10a  . atorvastatin  40 mg Oral q1800  . famotidine  20 mg Oral Daily  . fluticasone  2 spray Each Nare Daily  . isosorbide mononitrate  30 mg Oral Daily  . magnesium oxide  400 mg Oral BID  . metoprolol tartrate  50 mg Oral BID  . multivitamin with minerals  1 tablet Oral q morning - 10a  . pantoprazole  40 mg Oral Daily  . sodium bicarbonate  650 mg Oral BID  . sodium chloride flush  3 mL Intravenous Q12H   Continuous Infusions: . diltiazem (CARDIZEM) infusion 7.5 mg/hr (06/11/18 1045)  . heparin 1,400 Units/hr (06/11/18 0456)   PRN Meds: acetaminophen **OR** acetaminophen, diltiazem, loratadine, nitroGLYCERIN, ondansetron **OR** ondansetron (ZOFRAN) IV, sodium chloride flush  Allergies:    Allergies  Allergen Reactions  . Predicort [Prednisolone] Other (See Comments)    Stomach pain  . Ciprofloxacin     GI upset  . Hydrochlorothiazide Other (See Comments)    Dehydration  . Hydrocodone     Stomach upset  . Hydrocodone-Acetaminophen     Stomach upset  . Hydrocodone-Acetaminophen Nausea Only  .  Loratadine Other (See Comments)    Other reaction(s): Unknown  . Sulfa Antibiotics Other (See Comments)    Cannot recall  . Sulfacetamide Sodium Other (See Comments)    Cannot recall  . Sulfasalazine     Other reaction(s): Other (See Comments) Cannot recall  . Hydrocodone-Acetaminophen Nausea Only  . Penicillins Hives and Rash    Has patient had a PCN reaction causing immediate rash, facial/tongue/throat swelling, SOB or lightheadedness with hypotension: YES Has patient had a PCN reaction causing severe rash involving mucus membranes or skin necrosis: NO Has patient had a PCN reaction that required hospitalization NO Has patient had a PCN reaction occurring within the last 10 years:NO If all of the above answers are "NO", then may proceed with Cephalosporin use.    Social History:   Social History   Socioeconomic History  . Marital status: Married    Spouse name: Not on file  . Number of children: 2  . Years of education: Not on file  . Highest education level: 8th grade  Occupational History  . Occupation: Retired  Scientific laboratory technician  . Financial resource strain: Not hard at all  . Food insecurity:    Worry: Never true    Inability: Never true  . Transportation needs:    Medical: No    Non-medical: No  Tobacco Use  . Smoking status: Former Smoker    Packs/day: 3.00    Years: 20.00    Pack years: 60.00    Types: Cigarettes    Last attempt to quit: 07/25/1986    Years since quitting: 31.9  . Smokeless tobacco: Never Used  Substance and Sexual Activity  . Alcohol use: No    Alcohol/week: 0.0 standard drinks  . Drug use: No  . Sexual activity: Not Currently  Lifestyle  . Physical activity:    Days per week: 0 days    Minutes per session: 0 min  . Stress: Not at all  Relationships  . Social connections:    Talks on phone: Patient refused    Gets together: Patient refused  Attends religious service: Patient refused    Active member of club or organization: Patient  refused    Attends meetings of clubs or organizations: Patient refused    Relationship status: Patient refused  . Intimate partner violence:    Fear of current or ex partner: Patient refused    Emotionally abused: Patient refused    Physically abused: Patient refused    Forced sexual activity: Patient refused  Other Topics Concern  . Not on file  Social History Narrative   Did auto salvage work.   Lives at home with his wife.  Independent at baseline.    Family History:    Family History  Problem Relation Age of Onset  . Heart attack Mother        MI  . Stroke Mother   . Heart disease Mother   . Hypertension Mother   . Hyperlipidemia Mother   . Heart disease Father   . Rheumatic fever Father   . Colon cancer Neg Hx      ROS:  Please see the history of present illness.   All other ROS reviewed and negative.     Physical Exam/Data:   Vitals:   06/11/18 0935 06/11/18 1000 06/11/18 1030 06/11/18 1100  BP: 129/85 95/60 111/65 (!) 107/55  Pulse: (!) 140 (!) 140 (!) 111 76  Resp:      Temp:      TempSrc:      SpO2:      Weight:      Height:        Intake/Output Summary (Last 24 hours) at 06/11/2018 1300 Last data filed at 06/11/2018 0653 Gross per 24 hour  Intake 177.85 ml  Output 1245 ml  Net -1067.15 ml   Last 3 Weights 06/11/2018 06/10/2018 06/10/2018  Weight (lbs) 238 lb 9 oz 240 lb 3.2 oz 235 lb  Weight (kg) 108.21 kg 108.954 kg 106.595 kg     Body mass index is 34.23 kg/m.  General:  Chronically ill, overweight, in no acute distress HEENT: normal Vascular: No carotid bruits; FA pulses 2+ bilaterally without bruits  Cardiac:  iRRR Lungs:  Decreased BS, prolonged expiratory phase Abd: soft, nontender, no hepatomegaly  Ext: + dependant edema Musculoskeletal:  Diffuse muscle atrophy Skin: warm and dry  Neuro:  Strength/ sensation intact Psych:  Normal affect   EKG:  The EKG was personally reviewed and demonstrates:  afib initially, now typical atrial  flutter, no ischemic changes Telemetry:  Telemetry was personally reviewed and demonstrates:  Atrial flutter   Laboratory Data:  Chemistry Recent Labs  Lab 06/10/18 1230 06/11/18 0352  NA 143 143  K 4.5 4.0  CL 112* 111  CO2 25 23  GLUCOSE 152* 116*  BUN 33* 32*  CREATININE 1.93* 2.17*  CALCIUM 9.0 8.9  GFRNONAA 33* 29*  GFRAA 39* 34*  ANIONGAP 6 9    No results for input(s): PROT, ALBUMIN, AST, ALT, ALKPHOS, BILITOT in the last 168 hours. Hematology Recent Labs  Lab 06/10/18 1230 06/11/18 0352  WBC 7.0 7.8  RBC 4.65 4.59  HGB 12.9* 12.7*  HCT 40.5 40.1  MCV 87.1 87.4  MCH 27.7 27.7  MCHC 31.9 31.7  RDW 14.7 14.7  PLT 147* 139*   Cardiac Enzymes Recent Labs  Lab 06/10/18 1230 06/10/18 1833 06/10/18 2152 06/11/18 0352  TROPONINI <0.03 <0.03 <0.03 <0.03   No results for input(s): TROPIPOC in the last 168 hours.  BNPNo results for input(s): BNP, PROBNP in the last 168 hours.  DDimer No results for input(s): DDIMER in the last 168 hours.  Radiology/Studies:  Dg Chest 2 View  Result Date: 06/10/2018 CLINICAL DATA:  Cough for 3 months. Shortness of breath, chest pain, and palpitations. Coronary artery disease. EXAM: CHEST - 2 VIEW COMPARISON:  07/26/2017 FINDINGS: The heart size and mediastinal contours are within normal limits. Aortic atherosclerosis. Prior CABG again noted. Stable mild bibasilar scarring. No evidence of pulmonary infiltrate or edema. No evidence of pleural effusion. The visualized skeletal structures are unremarkable. IMPRESSION: Stable bibasilar scarring.  No active cardiopulmonary disease. Electronically Signed   By: Earle Gell M.D.   On: 06/10/2018 12:56    Assessment and Plan:   1. New onset afib with RVR and typical atrial flutter.  No other SVTs are noted. No indication for adenosine. He has multiple risk factors for atrial arrhythmias including obesity, COPD, HTN, DM, and renal failure. His chads2vasc score is at least 5.  CrCl is 30.   I will therefore stop IV heparin and place on xarelto 15mg  daily. Continue IV diltiazem for rate control.  Continue coreg 25mg  BID. Our AAD options are very limited.  With CAD and CHF, cannot use Ics or multaq.  Given renal failure, I would not advise tikosyn.  Amiodarone may eventually be required, though we would have to continue to follow lung function closely. I would anticipate TEE guided cardioversion early this week.  He has had esophageal structures previously, so he may not be a candidate for this.  Our other option would be oral rate control for 3 weeks and then return for elective cardioversion.  2. CAD/ ischemic CM No ischemic symptoms currently I would not plan to pursue ischemic workup at this time. Stop ASA and plavix and start xarelto (as above) Continue coreg  3. HTN Stable No change required today  4. Stage IV renal disease Limits our medical treatment options for afib  Cardiology to follow  For questions or updates, please contact [place] Please consult [email or web address] for contact info under     Signed, Thompson Grayer, MD  06/11/2018 1:00 PM

## 2018-06-11 NOTE — Progress Notes (Signed)
[facility] NAME: Ronald [name]    MR#:  [id number]  DATE OF BIRTH:  1946/04/08  SUBJECTIVE:  CHIEF COMPLAINT:   Chief Complaint  Patient presents with  . Palpitations   Continues to have palpitations and fatigue.  Some shortness of breath.  On 2 L oxygen for comfort.  Wife at bedside.  Afebrile. Chronic abdominal pain is the same. Heart rate 140s  REVIEW OF SYSTEMS:    Review of Systems  Constitutional: Positive for malaise/fatigue. Negative for chills and fever.  HENT: Negative for sore throat.   Eyes: Negative for blurred vision, double vision and pain.  Respiratory: Positive for shortness of breath. Negative for cough, hemoptysis and wheezing.   Cardiovascular: Positive for palpitations. Negative for chest pain, orthopnea and leg swelling.  Gastrointestinal: Positive for abdominal pain. Negative for constipation, diarrhea, heartburn, nausea and vomiting.  Genitourinary: Negative for dysuria and hematuria.  Musculoskeletal: Negative for back pain and joint pain.  Skin: Negative for rash.  Neurological: Negative for sensory change, speech change, focal weakness and headaches.  Endo/Heme/Allergies: Does not bruise/bleed easily.  Psychiatric/Behavioral: Negative for depression. The patient is not nervous/anxious.     DRUG ALLERGIES:   Allergies  Allergen Reactions  . Predicort [Prednisolone] Other (See Comments)    Stomach pain  . Ciprofloxacin     GI upset  . Hydrochlorothiazide Other (See Comments)    Dehydration  . Hydrocodone     Stomach upset  . Hydrocodone-Acetaminophen     Stomach upset  . Hydrocodone-Acetaminophen Nausea Only  . Loratadine Other (See Comments)    Other reaction(s): Unknown  . Sulfa Antibiotics Other (See Comments)    Cannot recall  . Sulfacetamide Sodium Other (See Comments)    Cannot recall  . Sulfasalazine     Other reaction(s): Other (See Comments) Cannot recall  .  Hydrocodone-Acetaminophen Nausea Only  . Penicillins Hives and Rash    Has patient had a PCN reaction causing immediate rash, facial/tongue/throat swelling, SOB or lightheadedness with hypotension: YES Has patient had a PCN reaction causing severe rash involving mucus membranes or skin necrosis: NO Has patient had a PCN reaction that required hospitalization NO Has patient had a PCN reaction occurring within the last 10 years:NO If all of the above answers are "NO", then may proceed with Cephalosporin use.    VITALS:  Blood pressure (!) 107/55, pulse 76, temperature 98.4 F (36.9 C), temperature source Oral, resp. rate 18, height 5\' 10"  (1.778 m), weight 108.2 kg, SpO2 97 %.  PHYSICAL EXAMINATION:   Physical Exam  GENERAL:  73 y.o.-year-old patient lying in the bed with no acute distress.  EYES: Pupils equal, round, reactive to light and accommodation. No scleral icterus. Extraocular muscles intact.  HEENT: Head atraumatic, normocephalic. Oropharynx and nasopharynx clear.  NECK:  Supple, no jugular venous distention. No thyroid enlargement, no tenderness.  LUNGS: Normal breath sounds bilaterally, no wheezing, rales, rhonchi. No use of accessory muscles of respiration.  CARDIOVASCULAR: Tachycardia.  Irregularly irregular ABDOMEN: Soft, nontender, nondistended. Bowel sounds present. No organomegaly or mass.  EXTREMITIES: No cyanosis, clubbing or edema b/l.    NEUROLOGIC: Cranial nerves II through XII are intact. No focal Motor or sensory deficits b/l.   PSYCHIATRIC: The patient is alert and oriented x 3.  SKIN: No obvious rash, lesion, or ulcer.   LABORATORY PANEL:   CBC Recent Labs  Lab 06/11/18 0352  WBC 7.8  HGB 12.7*  HCT 40.1  PLT 139*   ------------------------------------------------------------------------------------------------------------------  Chemistries  Recent Labs  Lab 06/10/18 1230 06/11/18 0352  NA 143 143  K 4.5 4.0  CL 112* 111  CO2 25 23  GLUCOSE  152* 116*  BUN 33* 32*  CREATININE 1.93* 2.17*  CALCIUM 9.0 8.9  MG 2.2  --    ------------------------------------------------------------------------------------------------------------------  Cardiac Enzymes Recent Labs  Lab 06/11/18 0352  TROPONINI <0.03   ------------------------------------------------------------------------------------------------------------------  RADIOLOGY:  Dg Chest 2 View  Result Date: 06/10/2018 CLINICAL DATA:  Cough for 3 months. Shortness of breath, chest pain, and palpitations. Coronary artery disease. EXAM: CHEST - 2 VIEW COMPARISON:  07/26/2017 FINDINGS: The heart size and mediastinal contours are within normal limits. Aortic atherosclerosis. Prior CABG again noted. Stable mild bibasilar scarring. No evidence of pulmonary infiltrate or edema. No evidence of pleural effusion. The visualized skeletal structures are unremarkable. IMPRESSION: Stable bibasilar scarring.  No active cardiopulmonary disease. Electronically Signed   By: Earle Gell M.D.   On: 06/10/2018 12:56     ASSESSMENT AND PLAN:   Ronald [name]  is a 73 y.o. male with a known history of CAD status post CABG in 1999 and 2007, stents placed in 2017, [name], history of ischemic cardiomyopathy, [name], [name]  1.  New onset A. fib-admit -heart rate in 140s to 150s Already tried on Cardizem push and metoprolol IV pushes Patient's telemetry showing SVT/atrial fibrillation and atrial flutter We will start Cardizem drip STAT.  Change Coreg to metoprolol.  Heparin drip. Discussed with Dr. Rayann Heman of cardiology  2.  CAD-with stable angina symptoms -Cardiology consult -Most recent cardiac catheterization was from February  2018  3.  Hypertension-on Coreg, Imdur, norvasc  4.  [name]-creatinine seems to be stable at  baseline.  He follows with Dr. Candiss Norse as outpatient.  If needs a cardiac cath, can consider nephrology consultation if needed. -Continue sodium bicarb  5.  GERD-Protonix  6.  DVT prophylaxis-already on heparin drip   Patient has documented healthcare power of attorney his wife.  No advance directives in place.  Discussed with patient and wife regarding atrial fibrillation, treatment.  Side effects of anticoagulation. We discussed in great detail regarding CODE STATUS.  Patient initially wanted to be a full code but was reminded by his wife regarding prior decisions.  After discussing regarding intubation/CPR/defibrillation he tells me that he wants to be a DO NOT RESUSCITATE and DO NOT INTUBATE.  Orders entered and CODE STATUS changed in the computer  All the records are reviewed and case discussed with Care Management/Social Worker Management plans discussed with the patient, family and they are in agreement.  CODE STATUS: DO NOT RESUSCITATE and DO NOT INTUBATE  TOTAL CRITICAL CARE TIME TAKING CARE OF THIS PATIENT:  80 minutes.   POSSIBLE D/C IN 1-2 DAYS, DEPENDING ON CLINICAL CONDITION.  Neita Carp M.D on 06/11/2018 at 12:31 PM  Between 7am to 6pm - Pager - [phone]  After 6pm go to [email or web address] - password [email or web address] [facility]  Office  [phone]  CC: Primary care physician; [name]., MD  Note: This dictation was prepared with Dragon dictation along with smaller phrase technology. Any transcriptional errors that result from this process are unintentional.

## 2018-06-11 NOTE — Plan of Care (Signed)
  Problem: Health Behavior/Discharge Planning: Goal: Ability to manage health-related needs will improve Outcome: Progressing   Problem: Education: Goal: Knowledge of disease or condition will improve Outcome: Progressing   Problem: Safety: Goal: Ability to remain free from injury will improve Outcome: Progressing

## 2018-06-11 NOTE — Progress Notes (Signed)
ANTICOAGULATION CONSULT NOTE - Initial Consult  Pharmacy Consult for heparin Indication: atrial fibrillation  Allergies  Allergen Reactions  . Predicort [Prednisolone] Other (See Comments)    Stomach pain  . Ciprofloxacin     GI upset  . Hydrochlorothiazide Other (See Comments)    Dehydration  . Hydrocodone     Stomach upset  . Hydrocodone-Acetaminophen     Stomach upset  . Hydrocodone-Acetaminophen Nausea Only  . Loratadine Other (See Comments)    Other reaction(s): Unknown  . Sulfa Antibiotics Other (See Comments)    Cannot recall  . Sulfacetamide Sodium Other (See Comments)    Cannot recall  . Sulfasalazine     Other reaction(s): Other (See Comments) Cannot recall  . Hydrocodone-Acetaminophen Nausea Only  . Penicillins Hives and Rash    Has patient had a PCN reaction causing immediate rash, facial/tongue/throat swelling, SOB or lightheadedness with hypotension: YES Has patient had a PCN reaction causing severe rash involving mucus membranes or skin necrosis: NO Has patient had a PCN reaction that required hospitalization NO Has patient had a PCN reaction occurring within the last 10 years:NO If all of the above answers are "NO", then may proceed with Cephalosporin use.    Patient Measurements: Height: 5\' 10"  (177.8 cm) Weight: 238 lb 9 oz (108.2 kg) IBW/kg (Calculated) : 73 Heparin Dosing Weight: 95.9 kg  Vital Signs: Temp: 98.4 F (36.9 C) (01/12 0315) Temp Source: Oral (01/12 0315) BP: 115/70 (01/12 0420) Pulse Rate: 146 (01/12 0420)  Labs: Recent Labs    06/10/18 1230 06/10/18 1434 06/10/18 1833 06/10/18 2152 06/11/18 0352  HGB 12.9*  --   --   --  12.7*  HCT 40.5  --   --   --  40.1  PLT 147*  --   --   --  139*  APTT  --  29  --   --   --   LABPROT  --  12.6  --   --   --   INR  --  0.95  --   --   --   HEPARINUNFRC  --   --   --  0.44 0.48  CREATININE 1.93*  --   --   --  2.17*  TROPONINI <0.03  --  <0.03 <0.03 <0.03    Estimated Creatinine  Clearance: 36.8 mL/min (A) (by C-G formula based on SCr of 2.17 mg/dL (H)).   Medical History: Past Medical History:  Diagnosis Date  . AAA (abdominal aortic aneurysm) ([place])    a. 3cm by [place] 2015.  [name] Arthritis    "hips; back" (12/13/2014)  . CAD (coronary artery disease) 2007   a. s/p CABG- IMA-LAD, VG-Cx, VG-RCA, VG-diag in 1999. B. sp redo CABG- VG-OM, VG-RCA in 2007 due to VG disease. c. NSTEMI 11/2014 s/p DES to SVG-OM from the Y graft.d. PTCA/DES x 1 distal body of SVG to Diagonal.09/2015  . Chronic combined systolic and diastolic CHF (congestive heart failure) ([place])    a. remote EF 40-45% in 2006. b. Normal EF 2014. b. Echo 07/2016 EF 45-50%, grade 1 DD.  [name] Chronic lower back pain   . CKD (chronic kidney disease), stage IV ([place])   . COPD (chronic obstructive pulmonary disease) ([place])   . Deafness in left ear   . Degenerative disc disease, lumbar   . Dilated cardiomyopathy ([place]) 10/07/2015  . Emphysema   . Esophageal stricture 07/02/1998   EGD  . GERD (gastroesophageal reflux disease)   . History of gout    "  last flareup was in 2007" (12/13/2014)  . History of hiatal hernia   . Hyperlipidemia   . Hypertension   . Ischemic cardiomyopathy 2006   EF 40% to 50% by 2D echo in 2006;  Echo 12/31/12: Mild LVH, EF 50-55%, normal wall motion.   [name] PVC's (premature ventricular contractions)   . Renal artery stenosis ([place])    a. noted on CT 2008.  . Type II diabetes mellitus ([place])    Diet control   . Walking pneumonia 1990's    Medications:  Infusions:  . heparin 1,400 Units/hr (06/11/18 0456)    Assessment: 72 yom cc palpitations with extensive cardiac history. AF noted in ED and pharmacy consulted to dose heparin. PTA med list is not complete but OP cardiology notes were reviewed and no PTA OAC noted. Will continue to follow and complete PTA list.   Goal of Therapy:  Heparin level 0.3-0.7 units/ml Monitor platelets by anticoagulation protocol: Yes   Plan:  Give 5000 units bolus x  1 Start heparin infusion at 1400 units/hr Check anti-Xa level in 8 hours and daily while on heparin Continue to monitor H&H and platelets   1/11 PM heparin level 0.44. Continue current regimen. Recheck heparin level and CBC with tomorrow AM labs.  1/12 AM heparin level 0.48. Continue current regimen. Recheck heparin level and CBC with tomorrow AM labs.  Jackqulyn Mendel S, PharmD, BCPS Clinical Pharmacist 06/11/2018,5:39 AM

## 2018-06-12 ENCOUNTER — Inpatient Hospital Stay (HOSPITAL_COMMUNITY)
Admit: 2018-06-12 | Discharge: 2018-06-12 | Disposition: A | Discharge status: Home / Self Care | Payer: Medicare Other | Attending: Internal Medicine | Admitting: Internal Medicine

## 2018-06-12 ENCOUNTER — Inpatient Hospital Stay: Payer: Medicare Other

## 2018-06-12 DIAGNOSIS — I480 Paroxysmal atrial fibrillation: Principal | ICD-10-CM

## 2018-06-12 DIAGNOSIS — I340 Nonrheumatic mitral (valve) insufficiency: Secondary | ICD-10-CM

## 2018-06-12 DIAGNOSIS — I361 Nonrheumatic tricuspid (valve) insufficiency: Secondary | ICD-10-CM

## 2018-06-12 DIAGNOSIS — I4819 Other persistent atrial fibrillation: Secondary | ICD-10-CM

## 2018-06-12 LAB — COMPREHENSIVE METABOLIC PANEL
ALT: 20 U/L (ref 0–44)
AST: 17 U/L (ref 15–41)
Albumin: 3.8 g/dL (ref 3.5–5.0)
Alkaline Phosphatase: 119 U/L (ref 38–126)
Anion gap: 7 (ref 5–15)
BUN: 37 mg/dL — AB (ref 8–23)
CO2: 25 mmol/L (ref 22–32)
Calcium: 9.2 mg/dL (ref 8.9–10.3)
Chloride: 109 mmol/L (ref 98–111)
Creatinine, Ser: 2.63 mg/dL — ABNORMAL HIGH (ref 0.61–1.24)
GFR calc Af Amer: 27 mL/min — ABNORMAL LOW (ref >60)
GFR, EST NON AFRICAN AMERICAN: 23 mL/min — AB (ref >60)
Glucose, Bld: 122 mg/dL — ABNORMAL HIGH (ref 70–99)
Potassium: 4.4 mmol/L (ref 3.5–5.1)
Sodium: 141 mmol/L (ref 135–145)
Total Bilirubin: 1.4 mg/dL — ABNORMAL HIGH (ref 0.3–1.2)
Total Protein: 6.1 g/dL — ABNORMAL LOW (ref 6.5–8.1)

## 2018-06-12 LAB — CBC
HCT: 39.8 % (ref 39.0–52.0)
Hemoglobin: 12.8 g/dL — ABNORMAL LOW (ref 13.0–17.0)
MCH: 28.4 pg (ref 26.0–34.0)
MCHC: 32.2 g/dL (ref 30.0–36.0)
MCV: 88.2 fL (ref 80.0–100.0)
PLATELETS: 144 10*3/uL — AB (ref 150–400)
RBC: 4.51 MIL/uL (ref 4.22–5.81)
RDW: 14.7 % (ref 11.5–15.5)
WBC: 7 10*3/uL (ref 4.0–10.5)
nRBC: 0 % (ref 0.0–0.2)

## 2018-06-12 LAB — PROTIME-INR
INR: 1.64
Prothrombin Time: 19.2 seconds — ABNORMAL HIGH (ref 11.4–15.2)

## 2018-06-12 LAB — TROPONIN I
Troponin I: 0.07 ng/mL (ref <0.03)
Troponin I: 0.06 ng/mL (ref <0.03)
Troponin I: 0.06 ng/mL (ref <0.03)

## 2018-06-12 LAB — MRSA PCR SCREENING: MRSA by PCR: NEGATIVE

## 2018-06-12 LAB — PHOSPHORUS: Phosphorus: 3.2 mg/dL (ref 2.5–4.6)

## 2018-06-12 LAB — ECHOCARDIOGRAM COMPLETE
Height: 70 in
Weight: 3739 oz

## 2018-06-12 LAB — MAGNESIUM: MAGNESIUM: 2.6 mg/dL — AB (ref 1.7–2.4)

## 2018-06-12 LAB — GLUCOSE, CAPILLARY: GLUCOSE-CAPILLARY: 102 mg/dL — AB (ref 70–99)

## 2018-06-12 MED ORDER — SODIUM CHLORIDE 0.9 % IV SOLN: 250.0000 mL | INTRAVENOUS | Status: DC

## 2018-06-12 MED ORDER — SODIUM CHLORIDE 0.9% FLUSH
3.0000 mL | Freq: Two times a day (BID) | INTRAVENOUS | Status: DC
  Administered 2018-06-12 – 2018-06-16 (×8): 3 mL via INTRAVENOUS

## 2018-06-12 MED ORDER — AMIODARONE HCL IN DEXTROSE 360-4.14 MG/200ML-% IV SOLN
60.0000 mg/h | INTRAVENOUS | Status: AC
  Administered 2018-06-12 (×2): 60 mg/h via INTRAVENOUS
  Filled 2018-06-12 (×2): qty 200

## 2018-06-12 MED ORDER — AMIODARONE HCL IN DEXTROSE 360-4.14 MG/200ML-% IV SOLN
60.0000 mg/h | INTRAVENOUS | Status: DC
  Administered 2018-06-12 – 2018-06-14 (×5): 30 mg/h via INTRAVENOUS
  Administered 2018-06-14 – 2018-06-16 (×8): 60 mg/h via INTRAVENOUS
  Filled 2018-06-12 (×11): qty 200

## 2018-06-12 MED ORDER — POLYETHYLENE GLYCOL 3350 17 G PO PACK
17.0000 g | PACK | Freq: Every day | ORAL | Status: DC
  Administered 2018-06-14 – 2018-06-16 (×2): 17 g via ORAL
  Filled 2018-06-12 (×3): qty 1

## 2018-06-12 MED ORDER — SODIUM CHLORIDE 0.9% FLUSH: 3.0000 mL | INTRAVENOUS | Status: DC | PRN

## 2018-06-12 MED ORDER — AMIODARONE LOAD VIA INFUSION
150.0000 mg | Freq: Once | INTRAVENOUS | Status: AC
  Administered 2018-06-12: 150 mg via INTRAVENOUS
  Filled 2018-06-12: qty 83.34

## 2018-06-12 NOTE — Progress Notes (Signed)
eLink Physician-Brief Progress Note Patient Name: Ronald [name] DOB: 1946/01/07 MRN: [id number]   Date of Service  06/12/2018  HPI/Events of Note  73 y.o. male with a known history of CAD status post CABG in 1999 and 2007, stents placed in 2017, [name], history of ischemic cardiomyopathy, [name], [name] PCCM asked to assume care in ICU. HR remains in 140's. Cardiology has been consulted to manage AFIB.  eICU Interventions  No new orders.      Intervention Category Evaluation Type: New Patient Evaluation  Ronald [name] 06/12/2018, 4:32 AM

## 2018-06-12 NOTE — Progress Notes (Signed)
Patient converted to NSR w/ PVC's at 1028 hours.

## 2018-06-12 NOTE — Progress Notes (Signed)
[facility] [place] NAME: Ronald [name]    MR#:  [id number]  DATE OF BIRTH:  1946-03-21  SUBJECTIVE:  CHIEF COMPLAINT:   Chief Complaint  Patient presents with  . Palpitations   Transferred to ICU due to atrial fibrillation with rapid ventricular rate. Converted to normal sinus rhythm today.  On amiodarone drip.  REVIEW OF SYSTEMS:    Review of Systems  Constitutional: Positive for malaise/fatigue. Negative for chills and fever.  HENT: Negative for sore throat.   Eyes: Negative for blurred vision, double vision and pain.  Respiratory: Positive for shortness of breath. Negative for cough, hemoptysis and wheezing.   Cardiovascular: Positive for palpitations. Negative for chest pain, orthopnea and leg swelling.  Gastrointestinal: Positive for abdominal pain. Negative for constipation, diarrhea, heartburn, nausea and vomiting.  Genitourinary: Negative for dysuria and hematuria.  Musculoskeletal: Negative for back pain and joint pain.  Skin: Negative for rash.  Neurological: Negative for sensory change, speech change, focal weakness and headaches.  Endo/Heme/Allergies: Does not bruise/bleed easily.  Psychiatric/Behavioral: Negative for depression. The patient is not nervous/anxious.     DRUG ALLERGIES:   Allergies  Allergen Reactions  . Predicort [Prednisolone] Other (See Comments)    Stomach pain  . Ciprofloxacin     GI upset  . Hydrochlorothiazide Other (See Comments)    Dehydration  . Hydrocodone     Stomach upset  . Hydrocodone-Acetaminophen     Stomach upset  . Hydrocodone-Acetaminophen Nausea Only  . Loratadine Other (See Comments)    Other reaction(s): Unknown  . Sulfa Antibiotics Other (See Comments)    Cannot recall  . Sulfacetamide Sodium Other (See Comments)    Cannot recall  . Sulfasalazine     Other reaction(s): Other (See Comments) Cannot recall  . Hydrocodone-Acetaminophen Nausea Only  . Penicillins Hives and  Rash    Has patient had a PCN reaction causing immediate rash, facial/tongue/throat swelling, SOB or lightheadedness with hypotension: YES Has patient had a PCN reaction causing severe rash involving mucus membranes or skin necrosis: NO Has patient had a PCN reaction that required hospitalization NO Has patient had a PCN reaction occurring within the last 10 years:NO If all of the above answers are "NO", then may proceed with Cephalosporin use.    VITALS:  Blood pressure (!) 116/54, pulse 63, temperature 98.1 F (36.7 C), resp. rate 16, height 5\' 10"  (1.778 m), weight 106 kg, SpO2 92 %.  PHYSICAL EXAMINATION:   Physical Exam  GENERAL:  73 y.o.-year-old patient lying in the bed with no acute distress.  EYES: Pupils equal, round, reactive to light and accommodation. No scleral icterus. Extraocular muscles intact.  HEENT: Head atraumatic, normocephalic. Oropharynx and nasopharynx clear.  NECK:  Supple, no jugular venous distention. No thyroid enlargement, no tenderness.  LUNGS: Normal breath sounds bilaterally, no wheezing, rales, rhonchi. No use of accessory muscles of respiration.  CARDIOVASCULAR: S1, S2. ABDOMEN: Soft, nontender, nondistended. Bowel sounds present. No organomegaly or mass.  EXTREMITIES: No cyanosis, clubbing or edema b/l.    NEUROLOGIC: Cranial nerves II through XII are intact. No focal Motor or sensory deficits b/l.   PSYCHIATRIC: The patient is alert and oriented x 3.  SKIN: No obvious rash, lesion, or ulcer.   LABORATORY PANEL:   CBC Recent Labs  Lab 06/12/18 0546  WBC 7.0  HGB 12.8*  HCT 39.8  PLT 144*   ------------------------------------------------------------------------------------------------------------------ Chemistries  Recent Labs  Lab 06/12/18 0546  NA 141  K 4.4  CL 109  CO2 25  GLUCOSE 122*  BUN 37*  CREATININE 2.63*  CALCIUM 9.2  MG 2.6*  AST 17  ALT 20  ALKPHOS 119  BILITOT 1.4*    ------------------------------------------------------------------------------------------------------------------  Cardiac Enzymes Recent Labs  Lab 06/12/18 1130  TROPONINI 0.06*   ------------------------------------------------------------------------------------------------------------------  RADIOLOGY:  No results found.   ASSESSMENT AND PLAN:   Ronald [name]  is a 73 y.o. male with a known history of CAD status post CABG in 1999 and 2007, stents placed in 2017, [name], history of ischemic cardiomyopathy, [name], [name]  1.  New onset A. Fib Converted to normal sinus rhythm today. On amiodarone drip.  Appreciate cardiology input.  Transition to oral amiodarone tomorrow Patient's telemetry showing SVT/atrial fibrillation and atrial flutter  2.  CAD-with stable angina symptoms -Cardiology consult -Most recent cardiac catheterization was from February  2018  3.  Hypertension-on metoprolol, Imdur, norvasc  4.  Acute kidney injury over [name].  Likely to poor perfusion from atrial fibrillation.  Will repeat labs in the morning.  Monitor input and output.  5.  GERD-Protonix  6.  DVT prophylaxis-already on heparin drip  All the records are reviewed and case discussed with Care Management/Social Worker Management plans discussed with the patient, family and they are in agreement.  CODE STATUS: DO NOT RESUSCITATE and DO NOT INTUBATE  TOTAL TIME TAKING CARE OF THIS PATIENT:  35 minutes.   POSSIBLE D/C IN 1-2 DAYS, DEPENDING ON CLINICAL CONDITION.  Ronald [name] Erika Hussar M.D on 06/12/2018 at 1:40 PM  Between 7am to 6pm - Pager - [phone]  After 6pm go to [email or web address] - password [email or web address] [facility]  Office  [phone]  CC: Primary care physician; [name].,  MD  Note: This dictation was prepared with Dragon dictation along with smaller phrase technology. Any transcriptional errors that result from this process are unintentional.

## 2018-06-12 NOTE — Progress Notes (Signed)
Progress Note  Patient Name: Ronald [name] [name] Date of Encounter: 06/12/2018  Primary Cardiologist: Angelena Form  Subjective   Feels well this morning. Remains in atrial flutter with variable AV block and frequent PVCs. At rest ventricular rates are in the 70s bpm, with movement in the bed, his ventricular rates increase into the low 100s bpm. Remains on supplemental oxygen via nasal cannula. Echo is pending. Off Cardizem gtt. Remains on amiodarone gtt and PO Lopressor.   Inpatient Medications    Scheduled Meds: . allopurinol  100 mg Oral QHS  . atorvastatin  40 mg Oral q1800  . famotidine  20 mg Oral Daily  . fluticasone  2 spray Each Nare Daily  . isosorbide mononitrate  30 mg Oral Daily  . magnesium oxide  400 mg Oral BID  . metoprolol tartrate  50 mg Oral BID  . multivitamin with minerals  1 tablet Oral q morning - 10a  . pantoprazole  40 mg Oral Daily  . rivaroxaban  15 mg Oral Q supper  . sodium bicarbonate  650 mg Oral BID  . sodium chloride flush  3 mL Intravenous Q12H   Continuous Infusions: . amiodarone     Followed by  . amiodarone    . diltiazem (CARDIZEM) infusion 5 mg/hr (06/12/18 0900)   PRN Meds: acetaminophen **OR** acetaminophen, loratadine, nitroGLYCERIN, ondansetron **OR** ondansetron (ZOFRAN) IV, sodium chloride flush   Vital Signs    Vitals:   06/12/18 0600 06/12/18 0700 06/12/18 0800 06/12/18 0900  BP: (!) 144/106 113/61 113/83 124/69  Pulse: (!) 107 76 87 (!) 104  Resp: (!) 24 14 17 18   Temp:   98.1 F (36.7 C)   TempSrc:      SpO2: 93% 90% 95% 91%  Weight:      Height:        Intake/Output Summary (Last 24 hours) at 06/12/2018 0910 Last data filed at 06/12/2018 0900 Gross per 24 hour  Intake 185.78 ml  Output -  Net 185.78 ml   Filed Weights   06/10/18 1600 06/11/18 0315 06/12/18 0500  Weight: 109 kg 108.2 kg 106 kg    Telemetry    Atrial flutter with variable AV block with ventricular rates in the 70s bpm currently. Noted to have  spontaneously convert to NSR on 1/11 with a 4.2 second post termination pause with redevelopment of Afib/flutter following - Personally Reviewed  ECG    Atrial flutter with RVR, 109 bpm, occasional PVCs, poor R wave progression - Personally Reviewed  Physical Exam   GEN: No acute distress.   Neck: No JVD. Cardiac: Irregular, no murmurs, rubs, or gallops.  Respiratory: Clear to auscultation bilaterally.  GI: Soft, nontender, non-distended.   MS: No edema; No deformity. Neuro:  Alert and oriented x 3; Nonfocal.  Psych: Normal affect.  Labs    Chemistry Recent Labs  Lab 06/10/18 1230 06/11/18 0352 06/12/18 0546  NA 143 143 141  K 4.5 4.0 4.4  CL 112* 111 109  CO2 25 23 25   GLUCOSE 152* 116* 122*  BUN 33* 32* 37*  CREATININE 1.93* 2.17* 2.63*  CALCIUM 9.0 8.9 9.2  PROT  --   --  6.1*  ALBUMIN  --   --  3.8  AST  --   --  17  ALT  --   --  20  ALKPHOS  --   --  119  BILITOT  --   --  1.4*  GFRNONAA 33* 29* 23*  GFRAA 39*  34* 27*  ANIONGAP 6 9 7      Hematology Recent Labs  Lab 06/10/18 1230 06/11/18 0352 06/12/18 0546  WBC 7.0 7.8 7.0  RBC 4.65 4.59 4.51  HGB 12.9* 12.7* 12.8*  HCT 40.5 40.1 39.8  MCV 87.1 87.4 88.2  MCH 27.7 27.7 28.4  MCHC 31.9 31.7 32.2  RDW 14.7 14.7 14.7  PLT 147* 139* 144*    Cardiac Enzymes Recent Labs  Lab 06/10/18 1833 06/10/18 2152 06/11/18 0352 06/12/18 0546  TROPONINI <0.03 <0.03 <0.03 0.07*   No results for input(s): TROPIPOC in the last 168 hours.   BNPNo results for input(s): BNP, PROBNP in the last 168 hours.   DDimer No results for input(s): DDIMER in the last 168 hours.   Radiology    Dg Chest 2 View  Result Date: 06/10/2018 IMPRESSION: Stable bibasilar scarring.  No active cardiopulmonary disease. Electronically Signed   By: Earle Gell M.D.   On: 06/10/2018 12:56    Cardiac Studies   LHC 07/28/2017: Conclusion     Ost RCA to Prox RCA lesion is 95% stenosed.  Prox RCA lesion is 100%  stenosed.  Prox Cx lesion is 100% stenosed.  Prox LAD lesion is 100% stenosed.  Origin lesion is 100% stenosed.  Origin lesion is 100% stenosed.  Previously placed Dist Graft to Insertion stent (unknown type) is widely patent.  Origin to Prox Graft lesion is 10% stenosed.  Prox Graft lesion is 30% stenosed.  Mid Graft to Dist Graft lesion is 30% stenosed.  Mid Graft lesion is 50% stenosed.   Significant native CAD with total occlusion of the LAD after the takeoff of the first diagonal vessel; total occlusion of the proximal left circumflex coronary artery; and total occlusion of the proximal RCA with antegrade bridging collaterals.  Patent LIMA to LAD.  Patent Y vein graft from the 1996 surgery which supplies the diagonal vessel and distal circumflex marginal vessel.  There is diffuse narrowing of 50% in the midportion of the Y graft supplying the diagonal vessel and a patent distal stent extending to the ostium of the diagonal.  The Y graft supplying the distal marginal is free of significant disease and has mild luminal irregularity.  Occluded vein graft which had supplied the distal RCA from the 1996 surgery.  Patent SVG supplying the distal RCA from the 2007 surgery with a stent in the proximal portion of the graft with 30% narrowings in the proximal and mid segment.  Occluded vein graft from 2007 which appears to also have been stented.  LVEDP 18 mm  RECOMMENDATION: Increase medical therapy.  __________  Echo 07/2017: Study Conclusions  - Left ventricle: The cavity size was mildly dilated. Systolic   function was normal. The estimated ejection fraction was in the   range of 55% to 60%. Doppler parameters are consistent with   abnormal left ventricular relaxation (grade 1 diastolic   dysfunction).  Patient Profile     73 y.o. male with history of CAD s/p 4-vessel CABG in 1999 s/p re-do CABG in 2007 s/p PCI/DES in 2016 of the VG-OM s/p PCI/DES in 2017 to the  VG-diag, ICM, [name], COPD, DM2, AAA, DDD, esophageal stricture, GERD, HTN, HLD, RAS, and PVCs admitted with new onset Afib/flutter.   Assessment & Plan    1. New onset Afib/flutter: -Remains in atrial flutter with variable AV block with ventricular rates in the 70s bpm at rest, though with minimal exertion in the bed he will become tachycardic into  the low 100s bpm -Ambulate to assess for symptoms and rate control -I suspect he will require TEE-guided DCCV prior to discharge (if his esophageal stricture allows) -Schedule for TEE/DCCV on 06/13/2018 -Has been transitioned from Coreg to Lopressor for added rate control -Started on amiodarone gtt for added rate control, continue for now given difficult to control rates and ventricular ectopy -Placed on Xarelto 15 mg on 06/11/2018 given Estimated Creatinine Clearance: 30 mL/min (A) (by C-G formula based on SCr of 2.63 mg/dL (H)).  -CHADS2VASc at least 5 (CHF, HTN, age x 1, DM, vascular disease)  2. CAD s/p CABG s/p multiple PCIs: -No chest pain -Ruled out -Echo pending -Lipitor, Xarelto in place of ASA, Lopressor, Imdur  3. ICM: -He does not appear grossly volume up at this time -Echo pending -Previously on Coreg, though has been transitioned to Lopressor for added rate control -Not on ACEi/ARB/spironolactone given CKD -Would add hydralazine to Imdur prior to discharge or in follow up -CHF education  4. Acute on [name]: -Renal function worsening, likely in the setting of the above -Limits options for rate control (digoxin) and rhythm control  -Basline SCr appears to range from ~ 1.9-2.3  5. HTN: -Blood pressure well controlled -Continue Lopressor and Imdur as above -Add hydralazine given CM prior to discharge or in follow up  6. HLD: -LDL of 56 from 10/2017 -Continue Lipitor   7. Esophageal stricture: -May preclude our ability to perform TEE as above  8. Elevated troponin: -No chest pain -Likely in the setting of  atrial flutter/fib with RVR and [name] -Echo pending -No plans for inpatient ischemic evaluation at this time   For questions or updates, please contact [facility] Please consult [email or web address] for contact info under Cardiology/STEMI.    Signed, Christell Faith, PA-C [facility] Pager: [phone] 06/12/2018, 9:10 AM

## 2018-06-12 NOTE — Consult Note (Signed)
PULMONARY / CRITICAL CARE MEDICINE  Name: Ronald [name] [facility] MRN: [id number] DOB: 12-16-1945    LOS: 2  Referring Provider: Dr. Marcille Blanco  Reason for Referral: Afib with RVR  HPI: 73 y/o male admitted with Afib with with RVR. He was seen by cardiology and  started on diltiazem, heparin and the plan was to subsequently cardiovert him at some point. However, last night he developed atrial fibrillation with ventricular rate in the 160s.  He was restarted on a diltiazem infusion and transferred to the ICU for further management.  Upon arrival in the ICU, his heart rate was still in the 140s and 150s despite being on 15 mg of diltiazem.  He denies chest pain, palpitations, nausea and vomiting but reports diffuse abdominal pain.  He describes abdominal pain as achy and diffuse.  He reports having 1 bowel movement since admission.  Past Medical History:  Diagnosis Date  . AAA (abdominal aortic aneurysm) ([place])    a. 3cm by [place] 2015.  [name] Arthritis    "hips; back" (12/13/2014)  . CAD (coronary artery disease) 2007   a. s/p CABG- IMA-LAD, VG-Cx, VG-RCA, VG-diag in 1999. B. sp redo CABG- VG-OM, VG-RCA in 2007 due to VG disease. c. NSTEMI 11/2014 s/p DES to SVG-OM from the Y graft.d. PTCA/DES x 1 distal body of SVG to Diagonal.09/2015  . Chronic combined systolic and diastolic CHF (congestive heart failure) ([place])    a. remote EF 40-45% in 2006. b. Normal EF 2014. b. Echo 07/2016 EF 45-50%, grade 1 DD.  [name] Chronic lower back pain   . CKD (chronic kidney disease), stage IV ([place])   . COPD (chronic obstructive pulmonary disease) ([place])   . Deafness in left ear   . Degenerative disc disease, lumbar   . Dilated cardiomyopathy ([place]) 10/07/2015  . Emphysema   . Esophageal stricture 07/02/1998   EGD  . GERD (gastroesophageal reflux disease)   . History of gout    "last flareup was in 2007" (12/13/2014)  . History of hiatal hernia   . Hyperlipidemia   . Hypertension   . Ischemic cardiomyopathy 2006   EF 40% to 50% by  2D echo in 2006;  Echo 12/31/12: Mild LVH, EF 50-55%, normal wall motion.   [name] PVC's (premature ventricular contractions)   . Renal artery stenosis ([place])    a. noted on CT 2008.  . Type II diabetes mellitus ([place])    Diet control   . Walking pneumonia 1990's   Past Surgical History:  Procedure Laterality Date  . CARDIAC CATHETERIZATION  "several"  . CARDIAC CATHETERIZATION N/A 12/13/2014   Procedure: Left Heart Cath and Coronary Angiography;  Surgeon: Jettie Booze, MD;  Location: [place] [facility];  Service: Cardiovascular;  Laterality: N/A;  . CARDIAC CATHETERIZATION  12/13/2014   Procedure: Coronary Stent Intervention;  Surgeon: Jettie Booze, MD;  Location: [place] [facility];  Service: Cardiovascular;;  . CARDIAC CATHETERIZATION N/A 10/07/2015   Procedure: Left Heart Cath and Cors/Grafts Angiography;  Surgeon: Burnell Blanks, MD;  Location: [place] [facility];  Service: Cardiovascular;  Laterality: N/A;  . CARDIAC CATHETERIZATION N/A 10/07/2015   Procedure: Coronary Stent Intervention;  Surgeon: Burnell Blanks, MD;  Location: [place] [facility];  Service: Cardiovascular;  Laterality: N/A;  . CORONARY ANGIOPLASTY  "several"  . CORONARY ANGIOPLASTY WITH STENT PLACEMENT  2005; 12/13/2014   "2; 1"  . CORONARY ARTERY BYPASS GRAFT  1996   CABG X5  . CORONARY ARTERY BYPASS GRAFT  March 2007   CABG X3  . ESOPHAGOGASTRODUODENOSCOPY (EGD) WITH ESOPHAGEAL DILATION  2000  . GREEN LIGHT LASER TURP (TRANSURETHRAL RESECTION OF PROSTATE  2000's   "not cancerous"  . HERNIA REPAIR    . LAPAROSCOPIC CHOLECYSTECTOMY    . LEFT HEART CATH AND CORS/GRAFTS ANGIOGRAPHY N/A 07/28/2017   Procedure: LEFT HEART CATH AND CORS/GRAFTS ANGIOGRAPHY;  Surgeon: Troy Sine, MD;  Location: [place] [facility];  Service: Cardiovascular;  Laterality: N/A;  . LUNG SURGERY  1996   "S/P CABG, had to put staple in lung after it had collapsed"  . UMBILICAL HERNIA REPAIR     w/chole   Prior to  Admission medications   Medication Sig Start Date End Date Taking? Authorizing Provider  amLODipine (NORVASC) 5 MG tablet Take 5 mg by mouth daily.   Yes [provider]  clopidogrel (PLAVIX) 75 MG tablet Take 75 mg by mouth daily.   Yes [provider]  donepezil (ARICEPT) 5 MG tablet Take 1 tablet (5 mg total) by mouth at bedtime. 01/23/18 03/04/18 Yes Sowles, Drue Stager, MD  empagliflozin (JARDIANCE) 25 MG TABS tablet Take 25 mg by mouth daily.   Yes [provider]  glycopyrrolate (ROBINUL) 1 MG tablet Take 1 mg by mouth 2 (two) times daily.   Yes [provider]  insulin aspart (NOVOLOG FLEXPEN) 100 UNIT/ML FlexPen Inject 12 Units into the skin 2 (two) times daily.   Yes [provider]  insulin aspart (NOVOLOG) 100 UNIT/ML FlexPen Inject 18 Units into the skin daily. At 1700   Yes [provider]  Insulin Degludec-Liraglutide (XULTOPHY) 100-3.6 UNIT-MG/ML SOPN Inject 50 Units into the skin daily.   Yes [provider]  levETIRAcetam (KEPPRA) 500 MG tablet Take 500 mg by mouth 2 (two) times daily.   Yes [provider]  lipase/protease/amylase (CREON) 12000 units CPEP capsule Take 6,000 Units by mouth 3 (three) times daily before meals.   Yes [provider]  lipase/protease/amylase (CREON) 12000 units CPEP capsule Take 3,000 Units by mouth at bedtime. With snack   Yes [provider]  lisinopril (PRINIVIL,ZESTRIL) 5 MG tablet Take 5 mg by mouth daily.   Yes [provider]  metoprolol succinate (TOPROL-XL) 25 MG 24 hr tablet Take 1 tablet (25 mg total) by mouth daily. 01/23/18  Yes Sowles, Drue Stager, MD  rosuvastatin (CRESTOR) 40 MG tablet Take 1 tablet (40 mg total) by mouth daily. 01/23/18 03/04/18 Yes Steele Sizer, MD  aspirin EC 81 MG tablet Take 81 mg by mouth daily.    [provider]  famotidine (PEPCID) 20 MG tablet Take 1 tablet (20 mg total) by mouth 2 (two) times daily. 01/23/18  02/22/18  Steele Sizer, MD  gabapentin (NEURONTIN) 300 MG capsule Take 1 capsule (300 mg total) by mouth 2 (two) times daily. 01/23/18 02/22/18  Steele Sizer, MD  insulin glargine (LANTUS) 100 UNIT/ML injection Inject 0.1 mLs (10 Units total) into the skin daily. 01/23/18 02/22/18  Steele Sizer, MD  lacosamide 100 MG TABS Take 1 tablet (100 mg total) by mouth 2 (two) times daily. Patient not taking: Reported on 03/04/2018 06/10/17   Fritzi Mandes, MD  promethazine (PHENERGAN) 12.5 MG tablet Take 1 tablet (12.5 mg total) by mouth every 6 (six) hours as needed for nausea or vomiting. Patient not taking: Reported on 03/04/2018 03/29/17   Stark Klein, MD  sertraline (ZOLOFT) 25 MG tablet Take 1 tablet (25 mg total) by mouth daily. Patient not taking: Reported on 03/04/2018 01/23/18  Steele Sizer, MD   Allergies Allergies  Allergen Reactions  . Predicort [Prednisolone] Other (See Comments)    Stomach pain  . Ciprofloxacin     GI upset  . Hydrochlorothiazide Other (See Comments)    Dehydration  . Hydrocodone     Stomach upset  . Hydrocodone-Acetaminophen     Stomach upset  . Hydrocodone-Acetaminophen Nausea Only  . Loratadine Other (See Comments)    Other reaction(s): Unknown  . Sulfa Antibiotics Other (See Comments)    Cannot recall  . Sulfacetamide Sodium Other (See Comments)    Cannot recall  . Sulfasalazine     Other reaction(s): Other (See Comments) Cannot recall  . Hydrocodone-Acetaminophen Nausea Only  . Penicillins Hives and Rash    Has patient had a PCN reaction causing immediate rash, facial/tongue/throat swelling, SOB or lightheadedness with hypotension: YES Has patient had a PCN reaction causing severe rash involving mucus membranes or skin necrosis: NO Has patient had a PCN reaction that required hospitalization NO Has patient had a PCN reaction occurring within the last 10 years:NO If all of the above answers are "NO", then may proceed with Cephalosporin use.     Family History Family History  Problem Relation Age of Onset  . Heart attack Mother        MI  . Stroke Mother   . Heart disease Mother   . Hypertension Mother   . Hyperlipidemia Mother   . Heart disease Father   . Rheumatic fever Father   . Colon cancer Neg Hx    Social History  reports that he quit smoking about 31 years ago. His smoking use included cigarettes. He has a 60.00 pack-year smoking history. He has never used smokeless tobacco. He reports that he does not drink alcohol or use drugs.  Review Of Systems:   Constitutional: Negative for fever and chills.  HENT: Negative for congestion and rhinorrhea.  Eyes: Negative for redness and visual disturbance.  Respiratory: Negative for shortness of breath and wheezing.  Cardiovascular: Negative for chest pain but positive for palpitations.  Gastrointestinal: Negative  for nausea , vomiting but positive for  and abdominal pain  Genitourinary: Negative for dysuria and urgency.  Endocrine: Denies polyuria, polyphagia and heat intolerance Musculoskeletal: Negative for myalgias and arthralgias.  Skin: Negative for pallor and wound.  Neurological: Negative for dizziness and headaches   VITAL SIGNS: BP 124/82 (BP Location: Left Arm)   Pulse (!) 143   Temp 98.2 F (36.8 C) (Oral)   Resp 18   Ht 5\' 10"  (1.778 m)   Wt 106 kg   SpO2 98%   BMI 33.53 kg/m   HEMODYNAMICS:    VENTILATOR SETTINGS:    INTAKE / OUTPUT: I/O last 3 completed shifts: In: 311.5 [I.V.:311.5] Out: 9983 [Urine:1245]  PHYSICAL EXAMINATION: General:  NAD HEENT: PERRLA, trachea midline, no JVD Neuro:  AAO X 3, no focal deficits Cardiovascular: Apical pulse tachycardic, irregular, no murmur regurg or gallop Lungs: Clear to auscultation bilaterally Abdomen: Obese, normal bowel sounds in all 4 quadrants, palpation reveals diffuse tenderness mostly in the left upper and lower quadrants, no organomegaly Musculoskeletal: No joint deformities,  positive range of motion Skin: Skin is warm and dry  LABS:  BMET Recent Labs  Lab 06/10/18 1230 06/11/18 0352  NA 143 143  K 4.5 4.0  CL 112* 111  CO2 25 23  BUN 33* 32*  CREATININE 1.93* 2.17*  GLUCOSE 152* 116*    Electrolytes Recent Labs  Lab 06/10/18 1230 06/11/18  2637  CALCIUM 9.0 8.9  MG 2.2  --     CBC Recent Labs  Lab 06/10/18 1230 06/11/18 0352  WBC 7.0 7.8  HGB 12.9* 12.7*  HCT 40.5 40.1  PLT 147* 139*    Coag's Recent Labs  Lab 06/10/18 1434  APTT 29  INR 0.95    Sepsis Markers No results for input(s): LATICACIDVEN, PROCALCITON, O2SATVEN in the last 168 hours.  ABG No results for input(s): PHART, PCO2ART, PO2ART in the last 168 hours.  Liver Enzymes No results for input(s): AST, ALT, ALKPHOS, BILITOT, ALBUMIN in the last 168 hours.  Cardiac Enzymes Recent Labs  Lab 06/10/18 1833 06/10/18 2152 06/11/18 0352  TROPONINI <0.03 <0.03 <0.03    Glucose Recent Labs  Lab 06/10/18 1636 06/11/18 0813 06/11/18 1129  GLUCAP 117* 109* 136*    Imaging No results found.   STUDIES:  2D echo pending  CULTURES: None  ANTIBIOTICS: None  SIGNIFICANT EVENTS: 04/10/2018: Admitted 04/12/2018: Transferred to the ICU due to A. fib with RVR  LINES/TUBES: Peripheral IVs   ASSESSMENT A. fib with RVR Hypertension Stage III CKD Coronary artery disease status post CABG Hyperlipidemia COPD Chronic systolic and diastolic heart failure  PLAN Hemodynamic monitoring per ICU protocol Discontinue diltiazem infusion and start amiodarone bolus followed by infusion Cycle cardiac enzymes Monitor and correct electrolytes Trend creatinine MiraLAX 17 g daily Abdominal x-ray Treatment plan per cardiology Rest of the treatment plan unchanged  Best Practice: Code Status: DNR Diet: Heart healthy diet GI prophylaxis: Not indicated VTE prophylaxis: Already on full-strength anticoagulation  FAMILY  - Updates: Patient and spouse updated  on current treatment plan.  All questions answered   S. [facility] ANP-BC Pulmonary and Critical Care Medicine [facility] Pager [phone] or [phone]  NB: This document was prepared using Dragon voice recognition software and may include unintentional dictation errors.    06/12/2018, 5:36 AM

## 2018-06-12 NOTE — Progress Notes (Signed)
*  PRELIMINARY RESULTS* Echocardiogram 2D Echocardiogram has been performed.  Ronald [name] 06/12/2018, 8:30 AM

## 2018-06-13 ENCOUNTER — Encounter
Admission: EM | Disposition: A | Discharge status: Home / Self Care | Payer: Self-pay | Source: Home / Self Care | Attending: Internal Medicine

## 2018-06-13 DIAGNOSIS — I2510 Atherosclerotic heart disease of native coronary artery without angina pectoris: Secondary | ICD-10-CM

## 2018-06-13 DIAGNOSIS — N183 Chronic kidney disease, stage 3 (moderate): Secondary | ICD-10-CM

## 2018-06-13 DIAGNOSIS — N179 Acute kidney failure, unspecified: Secondary | ICD-10-CM

## 2018-06-13 DIAGNOSIS — I255 Ischemic cardiomyopathy: Secondary | ICD-10-CM

## 2018-06-13 DIAGNOSIS — I5022 Chronic systolic (congestive) heart failure: Secondary | ICD-10-CM

## 2018-06-13 LAB — CBC WITH DIFFERENTIAL/PLATELET
Abs Immature Granulocytes: 0.03 10*3/uL (ref 0.00–0.07)
BASOS ABS: 0 10*3/uL (ref 0.0–0.1)
Basophils Relative: 0 %
EOS PCT: 4 %
Eosinophils Absolute: 0.3 10*3/uL (ref 0.0–0.5)
HCT: 37.5 % — ABNORMAL LOW (ref 39.0–52.0)
Hemoglobin: 12.1 g/dL — ABNORMAL LOW (ref 13.0–17.0)
Immature Granulocytes: 0 %
Lymphocytes Relative: 16 %
Lymphs Abs: 1.1 10*3/uL (ref 0.7–4.0)
MCH: 27.8 pg (ref 26.0–34.0)
MCHC: 32.3 g/dL (ref 30.0–36.0)
MCV: 86 fL (ref 80.0–100.0)
Monocytes Absolute: 0.9 10*3/uL (ref 0.1–1.0)
Monocytes Relative: 13 %
NRBC: 0 % (ref 0.0–0.2)
Neutro Abs: 4.6 10*3/uL (ref 1.7–7.7)
Neutrophils Relative %: 67 %
Platelets: 141 10*3/uL — ABNORMAL LOW (ref 150–400)
RBC: 4.36 MIL/uL (ref 4.22–5.81)
RDW: 14.5 % (ref 11.5–15.5)
WBC: 7 10*3/uL (ref 4.0–10.5)

## 2018-06-13 LAB — BASIC METABOLIC PANEL
ANION GAP: 8 (ref 5–15)
BUN: 37 mg/dL — ABNORMAL HIGH (ref 8–23)
CO2: 23 mmol/L (ref 22–32)
Calcium: 9 mg/dL (ref 8.9–10.3)
Chloride: 109 mmol/L (ref 98–111)
Creatinine, Ser: 2.49 mg/dL — ABNORMAL HIGH (ref 0.61–1.24)
GFR calc Af Amer: 28 mL/min — ABNORMAL LOW (ref >60)
GFR calc non Af Amer: 24 mL/min — ABNORMAL LOW (ref >60)
Glucose, Bld: 110 mg/dL — ABNORMAL HIGH (ref 70–99)
Potassium: 4.5 mmol/L (ref 3.5–5.1)
Sodium: 140 mmol/L (ref 135–145)

## 2018-06-13 LAB — GLUCOSE, CAPILLARY: GLUCOSE-CAPILLARY: 104 mg/dL — AB (ref 70–99)

## 2018-06-13 SURGERY — ECHOCARDIOGRAM, TRANSESOPHAGEAL
Anesthesia: General

## 2018-06-13 SURGERY — CARDIOVERSION (CATH LAB)
Anesthesia: General

## 2018-06-13 NOTE — Care Management Note (Signed)
Case Management Note  Patient Details  Name: Ronald [name] [facility] MRN: [id number] Date of Birth: [birth year]  Subjective/Objective:    Patient admitted with afib, currently in the ICU on amiodarone drip.  Patient's wife, [name] is at the bedside and reports her husband has had cardiac issues since 75. Patient is from home and lives with wife, patient is independent and requires no assistive devices.  PCP verified as Dr. [name], and his Cardiologist is Dr Angelena Form.  Next Cardiology appointment is in early February.  Pharmacy is [facility] [place].  No discharge needs identified    Doran Clay RN BSN [phone]                Action/Plan:   Expected Discharge Date:                  Expected Discharge Plan:  Home/Self Care  In-House Referral:     Discharge planning Services  CM Consult  Post Acute Care Choice:    Choice offered to:     DME Arranged:    DME Agency:     HH Arranged:    HH Agency:     Status of Service:  Completed, signed off  If discussed at [place] of Stay Meetings, dates discussed:    Additional Comments:  Shelbie Hutching, RN 06/13/2018, 11:55 AM

## 2018-06-13 NOTE — Progress Notes (Signed)
[place] [facility] [place] NAME: Ronald [name]    MR#:  [id number]  DATE OF BIRTH:  10-Apr-1946  SUBJECTIVE:  CHIEF COMPLAINT:   Chief Complaint  Patient presents with  . Palpitations   Transferred to ICU due to atrial fibrillation with rapid ventricular rate. Converted to normal sinus rhythm yesterday.  On amiodarone drip but overnight converted to atrial fibrillation.   No chest pain or shortness of breath. Afebrile.  REVIEW OF SYSTEMS:    Review of Systems  Constitutional: Positive for malaise/fatigue. Negative for chills and fever.  HENT: Negative for sore throat.   Eyes: Negative for blurred vision, double vision and pain.  Respiratory: Positive for shortness of breath. Negative for cough, hemoptysis and wheezing.   Cardiovascular: Positive for palpitations. Negative for chest pain, orthopnea and leg swelling.  Gastrointestinal: Positive for abdominal pain. Negative for constipation, diarrhea, heartburn, nausea and vomiting.  Genitourinary: Negative for dysuria and hematuria.  Musculoskeletal: Negative for back pain and joint pain.  Skin: Negative for rash.  Neurological: Negative for sensory change, speech change, focal weakness and headaches.  Endo/Heme/Allergies: Does not bruise/bleed easily.  Psychiatric/Behavioral: Negative for depression. The patient is not nervous/anxious.     DRUG ALLERGIES:   Allergies  Allergen Reactions  . Predicort [Prednisolone] Other (See Comments)    Stomach pain  . Ciprofloxacin     GI upset  . Hydrochlorothiazide Other (See Comments)    Dehydration  . Hydrocodone     Stomach upset  . Hydrocodone-Acetaminophen     Stomach upset  . Hydrocodone-Acetaminophen Nausea Only  . Loratadine Other (See Comments)    Other reaction(s): Unknown  . Sulfa Antibiotics Other (See Comments)    Cannot recall  . Sulfacetamide Sodium Other (See Comments)    Cannot recall  . Sulfasalazine     Other reaction(s):  Other (See Comments) Cannot recall  . Hydrocodone-Acetaminophen Nausea Only  . Penicillins Hives and Rash    Has patient had a PCN reaction causing immediate rash, facial/tongue/throat swelling, SOB or lightheadedness with hypotension: YES Has patient had a PCN reaction causing severe rash involving mucus membranes or skin necrosis: NO Has patient had a PCN reaction that required hospitalization NO Has patient had a PCN reaction occurring within the last 10 years:NO If all of the above answers are "NO", then may proceed with Cephalosporin use.    VITALS:  Blood pressure 133/75, pulse 68, temperature 98.4 F (36.9 C), temperature source Oral, resp. rate (!) 21, height 5\' 10"  (1.778 m), weight 106 kg, SpO2 93 %.  PHYSICAL EXAMINATION:   Physical Exam  GENERAL:  73 y.o.-year-old patient lying in the bed with no acute distress.  EYES: Pupils equal, round, reactive to light and accommodation. No scleral icterus. Extraocular muscles intact.  HEENT: Head atraumatic, normocephalic. Oropharynx and nasopharynx clear.  NECK:  Supple, no jugular venous distention. No thyroid enlargement, no tenderness.  LUNGS: Normal breath sounds bilaterally, no wheezing, rales, rhonchi. No use of accessory muscles of respiration.  CARDIOVASCULAR: Irregularly irregular.  Tachycardia ABDOMEN: Soft, nontender, nondistended. Bowel sounds present. No organomegaly or mass.  EXTREMITIES: No cyanosis, clubbing or edema b/l.    NEUROLOGIC: Cranial nerves II through XII are intact. No focal Motor or sensory deficits b/l.   PSYCHIATRIC: The patient is alert and oriented x 3.  SKIN: No obvious rash, lesion, or ulcer.   LABORATORY PANEL:   CBC Recent Labs  Lab 06/13/18 0457  WBC 7.0  HGB 12.1*  HCT  37.5*  PLT 141*   ------------------------------------------------------------------------------------------------------------------ Chemistries  Recent Labs  Lab 06/12/18 0546 06/13/18 0457  NA 141 140  K 4.4  4.5  CL 109 109  CO2 25 23  GLUCOSE 122* 110*  BUN 37* 37*  CREATININE 2.63* 2.49*  CALCIUM 9.2 9.0  MG 2.6*  --   AST 17  --   ALT 20  --   ALKPHOS 119  --   BILITOT 1.4*  --    ------------------------------------------------------------------------------------------------------------------  Cardiac Enzymes Recent Labs  Lab 06/12/18 1722  TROPONINI 0.06*   ------------------------------------------------------------------------------------------------------------------  RADIOLOGY:  Dg Abd 1 View  Result Date: 06/12/2018 CLINICAL DATA:  Abdominal pain. EXAM: ABDOMEN - 1 VIEW COMPARISON:  CT 07/08/2017. FINDINGS: Prior median sternotomy. Surgical clips right upper quadrant. No bowel distention or free air. Degenerative changes lumbar spine and both hips. No acute bony abnormality identified. Degenerative changes thoracolumbar spine. Aortoiliac atherosclerotic vascular calcification. Left base atelectasis. Small left pleural effusion can not be excluded. IMPRESSION: 1.  No acute intra-abdominal abnormality. 2. Left base atelectasis. Small left pleural effusion can not be excluded. Electronically Signed   By: Marcello Moores  Register   On: 06/12/2018 15:17     ASSESSMENT AND PLAN:   Ronald [name]  is a 73 y.o. male with a known history of CAD status post CABG in 1999 and 2007, stents placed in 2017, [name], history of ischemic cardiomyopathy, [name], [name]  1.  New onset A. Fib Converted to normal sinus rhythm yesterday.  Converted to atrial fibrillation overnight while on amiodarone drip. Will likely need TEE cardioversion as was planned and will wait for cardiology input.  2.  CAD-with stable angina symptoms -Cardiology consulted and appreciate input.  No further work-up needed at this time. -Most recent cardiac  catheterization was from February  2018  3.  Hypertension-on metoprolol, Imdur, norvasc  4.  Acute kidney injury over [name].  Likely to poor perfusion from atrial fibrillation. Improved today  5.  GERD-Protonix  6.  DVT prophylaxis-already on heparin drip  All the records are reviewed and case discussed with Care Management/Social Worker Management plans discussed with the patient, family and they are in agreement.  CODE STATUS: DO NOT RESUSCITATE and DO NOT INTUBATE  TOTAL TIME TAKING CARE OF THIS PATIENT:  35 minutes.   POSSIBLE D/C IN 1-2 DAYS, DEPENDING ON CLINICAL CONDITION.  Neita Carp M.D on 06/13/2018 at 12:46 PM  Between 7am to 6pm - Pager - [phone]  After 6pm go to [email or web address] - password [email or web address] [facility] [place] [facility]  Office  [phone]  CC: Primary care physician; [name]., MD  Note: This dictation was prepared with Dragon dictation along with smaller phrase technology. Any transcriptional errors that result from this process are unintentional.

## 2018-06-13 NOTE — Progress Notes (Signed)
Progress Note  Patient Name: Ronald [name] [facility], [place] Date of Encounter: 06/13/2018  Primary Cardiologist: Lauree Chandler, MD   Subjective   Feels well at this time.  Noted some chest tightness with atrial fibrillation and elevated heart rates this morning.  No shortness of breath or palpitations noted.  Patient remains on amiodarone infusion.  Inpatient Medications    Scheduled Meds: . allopurinol  100 mg Oral QHS  . atorvastatin  40 mg Oral q1800  . famotidine  20 mg Oral Daily  . fluticasone  2 spray Each Nare Daily  . isosorbide mononitrate  30 mg Oral Daily  . magnesium oxide  400 mg Oral BID  . metoprolol tartrate  50 mg Oral BID  . multivitamin with minerals  1 tablet Oral q morning - 10a  . pantoprazole  40 mg Oral Daily  . polyethylene glycol  17 g Oral Daily  . rivaroxaban  15 mg Oral Q supper  . sodium bicarbonate  650 mg Oral BID  . sodium chloride flush  3 mL Intravenous Q12H  . sodium chloride flush  3 mL Intravenous Q12H   Continuous Infusions: . sodium chloride    . amiodarone 30 mg/hr (06/13/18 1400)   PRN Meds: acetaminophen **OR** acetaminophen, loratadine, nitroGLYCERIN, ondansetron **OR** ondansetron (ZOFRAN) IV, sodium chloride flush, sodium chloride flush   Vital Signs    Vitals:   06/13/18 1200 06/13/18 1223 06/13/18 1300 06/13/18 1400  BP: 133/75 133/75    Pulse: 65 68 67 64  Resp: 19  20 17   Temp:    98 F (36.7 C)  TempSrc:    Oral  SpO2: 95%  94% 94%  Weight:      Height:        Intake/Output Summary (Last 24 hours) at 06/13/2018 1423 Last data filed at 06/13/2018 1400 Gross per 24 hour  Intake 1711.07 ml  Output 2150 ml  Net -438.93 ml   Last 3 Weights 06/12/2018 06/11/2018 06/10/2018  Weight (lbs) 233 lb 11 oz 238 lb 9 oz 240 lb 3.2 oz  Weight (kg) 106 kg 108.21 kg 108.954 kg      Telemetry    Currently normal sinus rhythm with paroxysmal atrial fibrillation with rapid ventricular response. - Personally Reviewed  ECG    No  new tracing.  Physical Exam   GEN: No acute distress.   Neck: No JVD Cardiac: RRR, no murmurs, rubs, or gallops.  Respiratory: Clear to auscultation bilaterally. GI: Soft, nontender, non-distended  MS: No edema; No deformity. Neuro:  Nonfocal  Psych: Normal affect   Labs    Chemistry Recent Labs  Lab 06/11/18 0352 06/12/18 0546 06/13/18 0457  NA 143 141 140  K 4.0 4.4 4.5  CL 111 109 109  CO2 23 25 23   GLUCOSE 116* 122* 110*  BUN 32* 37* 37*  CREATININE 2.17* 2.63* 2.49*  CALCIUM 8.9 9.2 9.0  PROT  --  6.1*  --   ALBUMIN  --  3.8  --   AST  --  17  --   ALT  --  20  --   ALKPHOS  --  119  --   BILITOT  --  1.4*  --   GFRNONAA 29* 23* 24*  GFRAA 34* 27* 28*  ANIONGAP 9 7 8      Hematology Recent Labs  Lab 06/11/18 0352 06/12/18 0546 06/13/18 0457  WBC 7.8 7.0 7.0  RBC 4.59 4.51 4.36  HGB 12.7* 12.8* 12.1*  HCT 40.1 39.8 37.5*  MCV 87.4 88.2 86.0  MCH 27.7 28.4 27.8  MCHC 31.7 32.2 32.3  RDW 14.7 14.7 14.5  PLT 139* 144* 141*    Cardiac Enzymes Recent Labs  Lab 06/11/18 0352 06/12/18 0546 06/12/18 1130 06/12/18 1722  TROPONINI <0.03 0.07* 0.06* 0.06*   No results for input(s): TROPIPOC in the last 168 hours.   BNPNo results for input(s): BNP, PROBNP in the last 168 hours.   DDimer No results for input(s): DDIMER in the last 168 hours.   Radiology    Dg Abd 1 View  Result Date: 06/12/2018 CLINICAL DATA:  Abdominal pain. EXAM: ABDOMEN - 1 VIEW COMPARISON:  CT 07/08/2017. FINDINGS: Prior median sternotomy. Surgical clips right upper quadrant. No bowel distention or free air. Degenerative changes lumbar spine and both hips. No acute bony abnormality identified. Degenerative changes thoracolumbar spine. Aortoiliac atherosclerotic vascular calcification. Left base atelectasis. Small left pleural effusion can not be excluded. IMPRESSION: 1.  No acute intra-abdominal abnormality. 2. Left base atelectasis. Small left pleural effusion can not be excluded.  Electronically Signed   By: [place]   On: 06/12/2018 15:17    Cardiac Studies   LHC 07/28/2017: Conclusion     Ost RCA to Prox RCA lesion is 95% stenosed.  Prox RCA lesion is 100% stenosed.  Prox Cx lesion is 100% stenosed.  Prox LAD lesion is 100% stenosed.  Origin lesion is 100% stenosed.  Origin lesion is 100% stenosed.  Previously placed Dist Graft to Insertion stent (unknown type) is widely patent.  Origin to Prox Graft lesion is 10% stenosed.  Prox Graft lesion is 30% stenosed.  Mid Graft to Dist Graft lesion is 30% stenosed.  Mid Graft lesion is 50% stenosed.  Significant native CAD with total occlusion of the LAD after the takeoff of the first diagonal vessel; total occlusion of the proximal left circumflex coronary artery; and total occlusion of the proximal RCA with antegrade bridging collaterals.  Patent LIMA to LAD.  Patent Y vein graft from the 1996 surgery which supplies the diagonal vessel and distal circumflex marginal vessel. There is diffuse narrowing of 50% in the midportion of the Y graft supplying the diagonal vessel and a patent distal stent extending to the ostium of the diagonal. The Y graft supplying the distal marginal is free of significant disease and has mild luminal irregularity.  Occluded vein graft which had supplied the distal RCA from the 1996 surgery.  Patent SVG supplying the distal RCA from the 2007 surgery with a stent in the proximal portion of the graft with 30% narrowings in the proximal and mid segment.  Occluded vein graft from 2007 which appears to also have been stented.  LVEDP 18 mm  RECOMMENDATION: Increase medical therapy.  __________  Echo 07/2017: Study Conclusions  - Left ventricle: The cavity size was mildly dilated. Systolic function was normal. The estimated ejection fraction was in the range of 55% to 60%. Doppler parameters are consistent with abnormal left ventricular relaxation  (grade 1 diastolic dysfunction).  Echo (06/12/2018): - Left ventricle: The cavity size was normal. There was moderate   concentric hypertrophy. Systolic function was moderately reduced.   The estimated ejection fraction was in the range of 35% to 40%.   Images were inadequate for LV wall motion assessment. The study   was not technically sufficient to allow evaluation of LV   diastolic dysfunction due to atrial fibrillation. - Mitral valve: There was mild regurgitation. - Left atrium: The atrium was mildly dilated. - Pulmonary  arteries: Systolic pressure was mildly increased. PA   peak pressure: 35 mm Hg (S).   Patient Profile     73 y.o. male with history of CAD s/p 4-vessel CABG in 1999 s/p re-do CABG in 2007 s/p PCI/DES in 2016 of the VG-OM s/p PCI/DES in 2017 to the VG-diag, ICM, [name], COPD, DM2, AAA, DDD, esophageal stricture, GERD, HTN, HLD, RAS, and PVCs admitted with new onset Afib/flutter.  Assessment & Plan    Paroxysmal atrial fibrillation/flutter Currently, patient is in sinus rhythm though he has paroxysms of atrial fibrillation.  Continue amiodarone infusion for at least 1 more night.  If he is able to maintain sinus rhythm for at least 24 hours, it could be transition to oral amiodarone to complete 10 gm load.  Continue rivaroxaban 15 mg daily.  Given paroxysmal atrial fibrillation, no indication for cardioversion.  Coronary artery disease Mild chest tightness noted with rapid ventricular response today.  Currently asymptomatic.  Suspect patient may have some element of demand ischemia with elevated heart rates.  LVEF noted to be moderately decreased, though in the setting of a-fib with RVR, LVEF estimation is challenging.  Continue secondary prevention.  If LVEF does not improve after discharge, repeat ischemia evaluation may need to be considered.  Chronic systolic heart failure LVEF moderately reduced this admission and down from prior echo in 07/2017.   I suspect this is most likely tachy-mediated superimposed on ischemic cardiomyopathy.  Patient appears euvolemic.  Transition metoprolol tartrate to carvedilol prior to discharge.  Defer adding ACEI/ARB and AA in setting of renal insufficiency.  Consider ischemia evaluation if LVEF does not improve over the next few months with adequate rate/rhythm control of a-fib.  Acute on chronic kidney disease Creatinine slightly better than yesterday but still a little above baseline.  Avoid nephrotoxic agents.  Maintain net even fluid balance.  For questions or updates, please contact [place] Please consult [email or web address] for contact info under [facility] Cardiology.    Signed, Nelva Bush, MD  06/13/2018, 2:23 PM

## 2018-06-14 DIAGNOSIS — I25118 Atherosclerotic heart disease of native coronary artery with other forms of angina pectoris: Secondary | ICD-10-CM

## 2018-06-14 DIAGNOSIS — I739 Peripheral vascular disease, unspecified: Secondary | ICD-10-CM

## 2018-06-14 DIAGNOSIS — I4891 Unspecified atrial fibrillation: Secondary | ICD-10-CM

## 2018-06-14 DIAGNOSIS — I483 Typical atrial flutter: Secondary | ICD-10-CM

## 2018-06-14 DIAGNOSIS — R079 Chest pain, unspecified: Secondary | ICD-10-CM

## 2018-06-14 LAB — CBC
HCT: 37.9 % — ABNORMAL LOW (ref 39.0–52.0)
Hemoglobin: 12.4 g/dL — ABNORMAL LOW (ref 13.0–17.0)
MCH: 28 pg (ref 26.0–34.0)
MCHC: 32.7 g/dL (ref 30.0–36.0)
MCV: 85.6 fL (ref 80.0–100.0)
NRBC: 0 % (ref 0.0–0.2)
Platelets: 136 10*3/uL — ABNORMAL LOW (ref 150–400)
RBC: 4.43 MIL/uL (ref 4.22–5.81)
RDW: 14.6 % (ref 11.5–15.5)
WBC: 7.4 10*3/uL (ref 4.0–10.5)

## 2018-06-14 LAB — BASIC METABOLIC PANEL
Anion gap: 8 (ref 5–15)
BUN: 35 mg/dL — ABNORMAL HIGH (ref 8–23)
CO2: 22 mmol/L (ref 22–32)
Calcium: 8.9 mg/dL (ref 8.9–10.3)
Chloride: 110 mmol/L (ref 98–111)
Creatinine, Ser: 2.52 mg/dL — ABNORMAL HIGH (ref 0.61–1.24)
GFR calc Af Amer: 28 mL/min — ABNORMAL LOW (ref >60)
GFR, EST NON AFRICAN AMERICAN: 24 mL/min — AB (ref >60)
Glucose, Bld: 104 mg/dL — ABNORMAL HIGH (ref 70–99)
Potassium: 4.3 mmol/L (ref 3.5–5.1)
Sodium: 140 mmol/L (ref 135–145)

## 2018-06-14 NOTE — Care Management Note (Signed)
Case Management Note  Patient Details  Name: Amin Fornwalt [facility] MRN: [id number] Date of Birth: Jul 15, 1945  Subjective/Objective:   Patient sitting up in bedside chair, he reports feeling well.  Patient will be discharged on Xarelto, 30 day free coupon for medication given to patient.  Cardiology is following and patient remains stepdown status for now.  Doran Clay RN BSN [id number]                   Action/Plan:   Expected Discharge Date:                  Expected Discharge Plan:  Home/Self Care  In-House Referral:     Discharge planning Services  CM Consult  Post Acute Care Choice:    Choice offered to:     DME Arranged:    DME Agency:     HH Arranged:    [place] Agency:     Status of Service:  Completed, signed off  If discussed at [place] of Stay Meetings, dates discussed:    Additional Comments:  Shelbie Hutching, RN 06/14/2018, 12:29 PM

## 2018-06-14 NOTE — Progress Notes (Signed)
Progress Note  Patient Name: Ronald [name] [facility] Date of Encounter: 06/14/2018  Primary Cardiologist: Lauree Chandler, MD   Subjective   Symptomatic atrial flutter starting 5:00 this morning lasting for several hours rate 140s Broke to sinus rhythm  Telemetry reviewed and he had atrial flutter on arrival breaking to normal sinus rhythm Rhythm this morning was atrial fibrillation Denies any significant chest pain He does have chronic shortness of breath on exertion  Inpatient Medications    Scheduled Meds: . allopurinol  100 mg Oral QHS  . atorvastatin  40 mg Oral q1800  . famotidine  20 mg Oral Daily  . fluticasone  2 spray Each Nare Daily  . isosorbide mononitrate  30 mg Oral Daily  . magnesium oxide  400 mg Oral BID  . metoprolol tartrate  50 mg Oral BID  . multivitamin with minerals  1 tablet Oral q morning - 10a  . pantoprazole  40 mg Oral Daily  . polyethylene glycol  17 g Oral Daily  . rivaroxaban  15 mg Oral Q supper  . sodium bicarbonate  650 mg Oral BID  . sodium chloride flush  3 mL Intravenous Q12H  . sodium chloride flush  3 mL Intravenous Q12H   Continuous Infusions: . sodium chloride    . amiodarone 30 mg/hr (06/14/18 1400)   PRN Meds: acetaminophen **OR** acetaminophen, loratadine, nitroGLYCERIN, ondansetron **OR** ondansetron (ZOFRAN) IV, sodium chloride flush, sodium chloride flush   Vital Signs    Vitals:   06/14/18 1200 06/14/18 1300 06/14/18 1400 06/14/18 1500  BP:   137/66 (!) 144/77  Pulse: 60 65 94 65  Resp: 18 16 19 15   Temp:   98.1 F (36.7 C)   TempSrc:   Oral   SpO2: 97% 94% 96% 98%  Weight:      Height:        Intake/Output Summary (Last 24 hours) at 06/14/2018 1548 Last data filed at 06/14/2018 1427 Gross per 24 hour  Intake 1070.61 ml  Output 1070 ml  Net 0.61 ml   Last 3 Weights 06/12/2018 06/11/2018 06/10/2018  Weight (lbs) 233 lb 11 oz 238 lb 9 oz 240 lb 3.2 oz  Weight (kg) 106 kg 108.21 kg 108.954 kg      Telemetry     Currently normal sinus rhythm Atrial flutter on arrival to the hospital lasting several hours Atrial fibrillation starting 5 AM this morning lasting for several hours  personally Reviewed  ECG    No new tracing.  Physical Exam   Constitutional:  oriented to person, place, and time. No distress.  HENT:  Head: Grossly normal Eyes:  no discharge. No scleral icterus.  Neck: No JVD, no carotid bruits  Cardiovascular: Regular rate and rhythm, no murmurs appreciated Pulmonary/Chest: Clear to auscultation bilaterally, no wheezes or rails Abdominal: Soft.  no distension.  no tenderness.  Musculoskeletal: Normal range of motion Neurological:  normal muscle tone. Coordination normal. No atrophy Skin: Skin warm and dry Psychiatric: normal affect, pleasant  Labs    Chemistry Recent Labs  Lab 06/12/18 0546 06/13/18 0457 06/14/18 0421  NA 141 140 140  K 4.4 4.5 4.3  CL 109 109 110  CO2 25 23 22   GLUCOSE 122* 110* 104*  BUN 37* 37* 35*  CREATININE 2.63* 2.49* 2.52*  CALCIUM 9.2 9.0 8.9  PROT 6.1*  --   --   ALBUMIN 3.8  --   --   AST 17  --   --   ALT 20  --   --  ALKPHOS 119  --   --   BILITOT 1.4*  --   --   GFRNONAA 23* 24* 24*  GFRAA 27* 28* 28*  ANIONGAP 7 8 8      Hematology Recent Labs  Lab 06/12/18 0546 06/13/18 0457 06/14/18 0421  WBC 7.0 7.0 7.4  RBC 4.51 4.36 4.43  HGB 12.8* 12.1* 12.4*  HCT 39.8 37.5* 37.9*  MCV 88.2 86.0 85.6  MCH 28.4 27.8 28.0  MCHC 32.2 32.3 32.7  RDW 14.7 14.5 14.6  PLT 144* 141* 136*    Cardiac Enzymes Recent Labs  Lab 06/11/18 0352 06/12/18 0546 06/12/18 1130 06/12/18 1722  TROPONINI <0.03 0.07* 0.06* 0.06*   No results for input(s): TROPIPOC in the last 168 hours.   BNPNo results for input(s): BNP, PROBNP in the last 168 hours.   DDimer No results for input(s): DDIMER in the last 168 hours.   Radiology    No results found.  Cardiac Studies   LHC 07/28/2017: Conclusion     Ost RCA to Prox RCA lesion  is 95% stenosed.  Prox RCA lesion is 100% stenosed.  Prox Cx lesion is 100% stenosed.  Prox LAD lesion is 100% stenosed.  Origin lesion is 100% stenosed.  Origin lesion is 100% stenosed.  Previously placed Dist Graft to Insertion stent (unknown type) is widely patent.  Origin to Prox Graft lesion is 10% stenosed.  Prox Graft lesion is 30% stenosed.  Mid Graft to Dist Graft lesion is 30% stenosed.  Mid Graft lesion is 50% stenosed.  Significant native CAD with total occlusion of the LAD after the takeoff of the first diagonal vessel; total occlusion of the proximal left circumflex coronary artery; and total occlusion of the proximal RCA with antegrade bridging collaterals.  Patent LIMA to LAD.  Patent Y vein graft from the 1996 surgery which supplies the diagonal vessel and distal circumflex marginal vessel. There is diffuse narrowing of 50% in the midportion of the Y graft supplying the diagonal vessel and a patent distal stent extending to the ostium of the diagonal. The Y graft supplying the distal marginal is free of significant disease and has mild luminal irregularity.  Occluded vein graft which had supplied the distal RCA from the 1996 surgery.  Patent SVG supplying the distal RCA from the 2007 surgery with a stent in the proximal portion of the graft with 30% narrowings in the proximal and mid segment.  Occluded vein graft from 2007 which appears to also have been stented.  LVEDP 18 mm  RECOMMENDATION: Increase medical therapy.  __________  Echo 07/2017: Study Conclusions  - Left ventricle: The cavity size was mildly dilated. Systolic function was normal. The estimated ejection fraction was in the range of 55% to 60%. Doppler parameters are consistent with abnormal left ventricular relaxation (grade 1 diastolic dysfunction).  Echo (06/12/2018): - Left ventricle: The cavity size was normal. There was moderate   concentric hypertrophy.  Systolic function was moderately reduced.   The estimated ejection fraction was in the range of 35% to 40%.   Images were inadequate for LV wall motion assessment. The study   was not technically sufficient to allow evaluation of LV   diastolic dysfunction due to atrial fibrillation. - Mitral valve: There was mild regurgitation. - Left atrium: The atrium was mildly dilated. - Pulmonary arteries: Systolic pressure was mildly increased. PA   peak pressure: 35 mm Hg (S).   Patient Profile     73 y.o. male with history of CAD s/p  4-vessel CABG in 1999 s/p re-do CABG in 2007 s/p PCI/DES in 2016 of the VG-OM s/p PCI/DES in 2017 to the VG-diag, ICM, [name], COPD, DM2, AAA, DDD, esophageal stricture, GERD, HTN, HLD, RAS, and PVCs admitted with new onset Afib/flutter.  Assessment & Plan    Paroxysmal atrial fibrillation/flutter Flutter on arrival lasting several hours Atrial fibrillation this morning, clearly different rhythm compared to rhythm on arrival --He had arrhythmia even on amiodarone infusion Recommend we increase amiodarone infusion up to 60 mg/h If he has a quiet night with no significant arrhythmia would potentially change to amiodarone 400 twice daily tomorrow He is on anticoagulation/Xarelto  Coronary artery disease Mild chest tightness in the setting of rapid ventricular rate No further ischemic work-up at this time  Chronic systolic heart failure LVEF moderately reduced , in the setting of atrial fib  tachy-mediated superimposed on ischemic cardiomyopathy.  Will consider a repeat limited echo ata later date  Acute on chronic kidney disease Stable , CR 2.5   Total encounter time more than 25 minutes  Greater than 50% was spent in counseling and coordination of care with the patient   For questions or updates, please contact [place] Please consult [email or web address] for contact info under [facility] Cardiology.    Signed, Ida Rogue, MD  06/14/2018, 3:48 PM

## 2018-06-15 ENCOUNTER — Inpatient Hospital Stay (HOSPITAL_COMMUNITY)
Admit: 2018-06-15 | Discharge: 2018-06-15 | Disposition: A | Discharge status: Home / Self Care | Payer: Medicare Other | Attending: Cardiovascular Disease | Admitting: Cardiovascular Disease

## 2018-06-15 DIAGNOSIS — I200 Unstable angina: Secondary | ICD-10-CM

## 2018-06-15 DIAGNOSIS — I340 Nonrheumatic mitral (valve) insufficiency: Secondary | ICD-10-CM

## 2018-06-15 LAB — ECHOCARDIOGRAM LIMITED
Height: 70 in
Weight: 3739 oz

## 2018-06-15 MED ORDER — METOPROLOL TARTRATE 50 MG PO TABS
100.0000 mg | ORAL_TABLET | Freq: Two times a day (BID) | ORAL | Status: DC
  Administered 2018-06-15 – 2018-06-16 (×2): 100 mg via ORAL
  Filled 2018-06-15 (×2): qty 2

## 2018-06-15 MED ORDER — METOPROLOL TARTRATE 25 MG PO TABS: 25.0000 mg | ORAL_TABLET | Freq: Once | ORAL | Status: DC

## 2018-06-15 MED ORDER — METOPROLOL TARTRATE 50 MG PO TABS
50.0000 mg | ORAL_TABLET | Freq: Once | ORAL | Status: AC
  Administered 2018-06-15: 50 mg via ORAL
  Filled 2018-06-15: qty 1

## 2018-06-15 NOTE — Progress Notes (Signed)
*  PRELIMINARY RESULTS* Echocardiogram 2D Echocardiogram has been performed.  Ronald [name] 06/15/2018, 12:54 PM

## 2018-06-15 NOTE — Progress Notes (Signed)
Progress Note  Patient Name: Ronald [name] [facility] Date of Encounter: 06/15/2018  Primary Cardiologist: Lauree Chandler, MD   Subjective   Uneventful day yesterday maintain normal sinus rhythm and overnight 6 AM this morning back into atrial fibrillation with RVR rate 140s Was given his metoprolol oral dosing 50 mg 9:00 At 10 AM paroxysmal A. fib with sinus, improved rate,   Inpatient Medications    Scheduled Meds: . allopurinol  100 mg Oral QHS  . atorvastatin  40 mg Oral q1800  . famotidine  20 mg Oral Daily  . fluticasone  2 spray Each Nare Daily  . isosorbide mononitrate  30 mg Oral Daily  . magnesium oxide  400 mg Oral BID  . metoprolol tartrate  100 mg Oral BID  . multivitamin with minerals  1 tablet Oral q morning - 10a  . pantoprazole  40 mg Oral Daily  . polyethylene glycol  17 g Oral Daily  . rivaroxaban  15 mg Oral Q supper  . sodium bicarbonate  650 mg Oral BID  . sodium chloride flush  3 mL Intravenous Q12H  . sodium chloride flush  3 mL Intravenous Q12H   Continuous Infusions: . sodium chloride    . amiodarone 60 mg/hr (06/15/18 0800)   PRN Meds: acetaminophen **OR** acetaminophen, loratadine, nitroGLYCERIN, ondansetron **OR** ondansetron (ZOFRAN) IV, sodium chloride flush, sodium chloride flush   Vital Signs    Vitals:   06/15/18 0800 06/15/18 0900 06/15/18 0908 06/15/18 1028  BP: 134/88 (!) 134/97 (!) 134/97 130/63  Pulse: (!) 102 95 93 62  Resp: 15 16    Temp: 98.3 F (36.8 C)     TempSrc: Oral     SpO2: 95% 93%    Weight:      Height:        Intake/Output Summary (Last 24 hours) at 06/15/2018 1301 Last data filed at 06/15/2018 0958 Gross per 24 hour  Intake 1412.18 ml  Output 1250 ml  Net 162.18 ml   Last 3 Weights 06/12/2018 06/11/2018 06/10/2018  Weight (lbs) 233 lb 11 oz 238 lb 9 oz 240 lb 3.2 oz  Weight (kg) 106 kg 108.21 kg 108.954 kg      Telemetry    Currently normal sinus rhythm Atrial flutter on arrival to the hospital  lasting several hours Atrial fibrillation yesterday morning converting to normal sinus rhythm late morning Atrial fibrillation again this morning 6 AM lasting approximately 4 hours  personally Reviewed  ECG    No new tracing.  Physical Exam   Constitutional:  oriented to person, place, and time. No distress.  HENT:  Head: Normocephalic and atraumatic.  Eyes:  no discharge. No scleral icterus.  Neck: Normal range of motion. Neck supple. No JVD present.  Cardiovascular: Normal sinus rhythm, periods of irregularly irregular rhythm, also ectopy normal heart sounds and intact distal pulses. Exam reveals no gallop and no friction rub. No edema No murmur heard. Pulmonary/Chest: Effort normal and breath sounds normal. No stridor. No respiratory distress.  no wheezes.  no rales.  no tenderness.  Abdominal: Soft.  no distension.  no tenderness.  Musculoskeletal: Normal range of motion.  no  tenderness or deformity.  Neurological:  normal muscle tone. Coordination normal. No atrophy Skin: Skin is warm and dry. No rash noted. not diaphoretic.  Psychiatric:  normal mood and affect. behavior is normal. Thought content normal.      Labs    Chemistry Recent Labs  Lab 06/12/18 0546 06/13/18 0457 06/14/18 0421  NA 141 140 140  K 4.4 4.5 4.3  CL 109 109 110  CO2 25 23 22   GLUCOSE 122* 110* 104*  BUN 37* 37* 35*  CREATININE 2.63* 2.49* 2.52*  CALCIUM 9.2 9.0 8.9  PROT 6.1*  --   --   ALBUMIN 3.8  --   --   AST 17  --   --   ALT 20  --   --   ALKPHOS 119  --   --   BILITOT 1.4*  --   --   GFRNONAA 23* 24* 24*  GFRAA 27* 28* 28*  ANIONGAP 7 8 8      Hematology Recent Labs  Lab 06/12/18 0546 06/13/18 0457 06/14/18 0421  WBC 7.0 7.0 7.4  RBC 4.51 4.36 4.43  HGB 12.8* 12.1* 12.4*  HCT 39.8 37.5* 37.9*  MCV 88.2 86.0 85.6  MCH 28.4 27.8 28.0  MCHC 32.2 32.3 32.7  RDW 14.7 14.5 14.6  PLT 144* 141* 136*    Cardiac Enzymes Recent Labs  Lab 06/11/18 0352 06/12/18 0546  06/12/18 1130 06/12/18 1722  TROPONINI <0.03 0.07* 0.06* 0.06*   No results for input(s): TROPIPOC in the last 168 hours.   BNPNo results for input(s): BNP, PROBNP in the last 168 hours.   DDimer No results for input(s): DDIMER in the last 168 hours.   Radiology    No results found.  Cardiac Studies   LHC 07/28/2017: Conclusion     Ost RCA to Prox RCA lesion is 95% stenosed.  Prox RCA lesion is 100% stenosed.  Prox Cx lesion is 100% stenosed.  Prox LAD lesion is 100% stenosed.  Origin lesion is 100% stenosed.  Origin lesion is 100% stenosed.  Previously placed Dist Graft to Insertion stent (unknown type) is widely patent.  Origin to Prox Graft lesion is 10% stenosed.  Prox Graft lesion is 30% stenosed.  Mid Graft to Dist Graft lesion is 30% stenosed.  Mid Graft lesion is 50% stenosed.  Significant native CAD with total occlusion of the LAD after the takeoff of the first diagonal vessel; total occlusion of the proximal left circumflex coronary artery; and total occlusion of the proximal RCA with antegrade bridging collaterals.  Patent LIMA to LAD.  Patent Y vein graft from the 1996 surgery which supplies the diagonal vessel and distal circumflex marginal vessel. There is diffuse narrowing of 50% in the midportion of the Y graft supplying the diagonal vessel and a patent distal stent extending to the ostium of the diagonal. The Y graft supplying the distal marginal is free of significant disease and has mild luminal irregularity.  Occluded vein graft which had supplied the distal RCA from the 1996 surgery.  Patent SVG supplying the distal RCA from the 2007 surgery with a stent in the proximal portion of the graft with 30% narrowings in the proximal and mid segment.  Occluded vein graft from 2007 which appears to also have been stented.  LVEDP 18 mm  RECOMMENDATION: Increase medical therapy.  __________  Echo 07/2017: Study Conclusions  - Left  ventricle: The cavity size was mildly dilated. Systolic function was normal. The estimated ejection fraction was in the range of 55% to 60%. Doppler parameters are consistent with abnormal left ventricular relaxation (grade 1 diastolic dysfunction).  Echo (06/12/2018): - Left ventricle: The cavity size was normal. There was moderate   concentric hypertrophy. Systolic function was moderately reduced.   The estimated ejection fraction was in the range of 35% to 40%.   Images  were inadequate for LV wall motion assessment. The study   was not technically sufficient to allow evaluation of LV   diastolic dysfunction due to atrial fibrillation. - Mitral valve: There was mild regurgitation. - Left atrium: The atrium was mildly dilated. - Pulmonary arteries: Systolic pressure was mildly increased. PA   peak pressure: 35 mm Hg (S).   Patient Profile     73 y.o. male with history of CAD s/p 4-vessel CABG in 1999 s/p re-do CABG in 2007 s/p PCI/DES in 2016 of the VG-OM s/p PCI/DES in 2017 to the VG-diag, ICM, [name], COPD, DM2, AAA, DDD, esophageal stricture, GERD, HTN, HLD, RAS, and PVCs admitted with new onset Afib/flutter.  Assessment & Plan    Paroxysmal atrial fibrillation/flutter Flutter on arrival lasting several hours Atrial fibrillation yesterday morning lasting several hours, again this morning lasting several hours -Would continue amiodarone infusion 60 mg/min -We will increase metoprolol tartrate up to 100 twice daily If blood pressure runs low may need to hold isosorbide -- would potentially change to amiodarone 400 twice daily tomorrow He is on anticoagulation/Xarelto  Coronary artery disease No further ischemic work-up at this time, denies any anginal symptoms -We will plan for echocardiogram after normal sinus rhythm restored to evaluate ejection fraction  Chronic systolic heart failure LVEF moderately reduced , in the setting of atrial fib  tachy-mediated  superimposed on ischemic cardiomyopathy.  We will try to repeat limited estimation of ejection fraction and normal sinus rhythm before embarking on ischemic work-up He denies having any tightness in his chest concerning for angina  Acute on chronic kidney disease Stable , CR 2.5  Case discussed with nursing  Total encounter time more than 25 minutes  Greater than 50% was spent in counseling and coordination of care with the patient   For questions or updates, please contact [place] Please consult [email or web address] for contact info under [facility].    Signed, Ida Rogue, MD  06/15/2018, 1:01 PM

## 2018-06-15 NOTE — Progress Notes (Signed)
[place] at [place] NAME: Ronald [name]    MR#:  [id number]  DATE OF BIRTH:  Dec 09, 1945  SUBJECTIVE:  CHIEF COMPLAINT:   Chief Complaint  Patient presents with  . Palpitations   Transferred to ICU due to atrial fibrillation with rapid ventricular rate. He is back in Afib while on amiodarone drip No chest pain or shortness of breath Afebrile  REVIEW OF SYSTEMS:    Review of Systems  Constitutional: Positive for malaise/fatigue. Negative for chills and fever.  HENT: Negative for sore throat.   Eyes: Negative for blurred vision, double vision and pain.  Respiratory: Positive for shortness of breath. Negative for cough, hemoptysis and wheezing.   Cardiovascular: Positive for palpitations. Negative for chest pain, orthopnea and leg swelling.  Gastrointestinal: Positive for abdominal pain. Negative for constipation, diarrhea, heartburn, nausea and vomiting.  Genitourinary: Negative for dysuria and hematuria.  Musculoskeletal: Negative for back pain and joint pain.  Skin: Negative for rash.  Neurological: Negative for sensory change, speech change, focal weakness and headaches.  Endo/Heme/Allergies: Does not bruise/bleed easily.  Psychiatric/Behavioral: Negative for depression. The patient is not nervous/anxious.     DRUG ALLERGIES:   Allergies  Allergen Reactions  . Predicort [Prednisolone] Other (See Comments)    Stomach pain  . Ciprofloxacin     GI upset  . Hydrochlorothiazide Other (See Comments)    Dehydration  . Hydrocodone     Stomach upset  . Hydrocodone-Acetaminophen     Stomach upset  . Hydrocodone-Acetaminophen Nausea Only  . Loratadine Other (See Comments)    Other reaction(s): Unknown  . Sulfa Antibiotics Other (See Comments)    Cannot recall  . Sulfacetamide Sodium Other (See Comments)    Cannot recall  . Sulfasalazine     Other reaction(s): Other (See Comments) Cannot recall  . Hydrocodone-Acetaminophen Nausea  Only  . Penicillins Hives and Rash    Has patient had a PCN reaction causing immediate rash, facial/tongue/throat swelling, SOB or lightheadedness with hypotension: YES Has patient had a PCN reaction causing severe rash involving mucus membranes or skin necrosis: NO Has patient had a PCN reaction that required hospitalization NO Has patient had a PCN reaction occurring within the last 10 years:NO If all of the above answers are "NO", then may proceed with Cephalosporin use.    VITALS:  Blood pressure 130/63, pulse 62, temperature 98.3 F (36.8 C), temperature source Oral, resp. rate 16, height 5\' 10"  (1.778 m), weight 106 kg, SpO2 93 %.  PHYSICAL EXAMINATION:   Physical Exam  GENERAL:  73 y.o.-year-old patient lying in the bed with no acute distress.  EYES: Pupils equal, round, reactive to light and accommodation. No scleral icterus. Extraocular muscles intact.  HEENT: Head atraumatic, normocephalic. Oropharynx and nasopharynx clear.  NECK:  Supple, no jugular venous distention. No thyroid enlargement, no tenderness.  LUNGS: Normal breath sounds bilaterally, no wheezing, rales, rhonchi. No use of accessory muscles of respiration.  CARDIOVASCULAR: Irregularly irregular.  Tachycardia ABDOMEN: Soft, nontender, nondistended. Bowel sounds present. No organomegaly or mass.  EXTREMITIES: No cyanosis, clubbing or edema b/l.    NEUROLOGIC: Cranial nerves II through XII are intact. No focal Motor or sensory deficits b/l.   PSYCHIATRIC: The patient is alert and oriented x 3.  SKIN: No obvious rash, lesion, or ulcer.   LABORATORY PANEL:   CBC Recent Labs  Lab 06/14/18 0421  WBC 7.4  HGB 12.4*  HCT 37.9*  PLT 136*   ------------------------------------------------------------------------------------------------------------------ Chemistries  Recent  Labs  Lab 06/12/18 0546  06/14/18 0421  NA 141   < > 140  K 4.4   < > 4.3  CL 109   < > 110  CO2 25   < > 22  GLUCOSE 122*   < > 104*   BUN 37*   < > 35*  CREATININE 2.63*   < > 2.52*  CALCIUM 9.2   < > 8.9  MG 2.6*  --   --   AST 17  --   --   ALT 20  --   --   ALKPHOS 119  --   --   BILITOT 1.4*  --   --    < > = values in this interval not displayed.   ------------------------------------------------------------------------------------------------------------------  Cardiac Enzymes Recent Labs  Lab 06/12/18 1722  TROPONINI 0.06*   ------------------------------------------------------------------------------------------------------------------  RADIOLOGY:  No results found.   ASSESSMENT AND PLAN:   Ronald [name]  is a 73 y.o. male with a known history of CAD status post CABG in 1999 and 2007, stents placed in 2017, [name], history of ischemic cardiomyopathy, [name], [name]  1.  New onset A. Fib Converted to normal sinus rhythm yesterday.  Converted back to atrial fibrillation overnight while on amiodarone drip.  Amiodarone drip being increased by cardiology On Xarelto for anticoagulation  2.  CAD-with stable angina symptoms -Cardiology consulted and appreciate input.  No further work-up needed at this time. -Most recent cardiac catheterization was from February  2018  3.  Hypertension-on metoprolol, Imdur, norvasc  4.  CKD 4 stable  5.  GERD-Protonix  6.  DVT prophylaxis-already on heparin drip  All the records are reviewed and case discussed with Care Management/Social Worker Management plans discussed with the patient, family and they are in agreement.  CODE STATUS: DO NOT RESUSCITATE and DO NOT INTUBATE  TOTAL TIME TAKING CARE OF THIS PATIENT:  35 minutes.   POSSIBLE D/C IN 1-2 DAYS, DEPENDING ON CLINICAL CONDITION.  Leia Alf Fayne Mcguffee M.D on 06/15/2018 at 12:39 PM  Between 7am to 6pm - Pager - [phone]  After  6pm go to [email or web address] - password [email or web address] [facility]  Office  [phone]  CC: Primary care physician; [name]., MD  Note: This dictation was prepared with Dragon dictation along with smaller phrase technology. Any transcriptional errors that result from this process are unintentional.

## 2018-06-15 NOTE — Progress Notes (Signed)
[place] at [place] NAME: Ronald [name]    MR#:  [id number]  DATE OF BIRTH:  23-Sep-1945  SUBJECTIVE:  CHIEF COMPLAINT:   Chief Complaint  Patient presents with  . Palpitations   Was in Afib earlier in the day and now NSR. On amiodarone drip Afebrile Chronic SOB is same  REVIEW OF SYSTEMS:    Review of Systems  Constitutional: Positive for malaise/fatigue. Negative for chills and fever.  HENT: Negative for sore throat.   Eyes: Negative for blurred vision, double vision and pain.  Respiratory: Positive for shortness of breath. Negative for cough, hemoptysis and wheezing.   Cardiovascular: Positive for palpitations. Negative for chest pain, orthopnea and leg swelling.  Gastrointestinal: Positive for abdominal pain. Negative for constipation, diarrhea, heartburn, nausea and vomiting.  Genitourinary: Negative for dysuria and hematuria.  Musculoskeletal: Negative for back pain and joint pain.  Skin: Negative for rash.  Neurological: Negative for sensory change, speech change, focal weakness and headaches.  Endo/Heme/Allergies: Does not bruise/bleed easily.  Psychiatric/Behavioral: Negative for depression. The patient is not nervous/anxious.     DRUG ALLERGIES:   Allergies  Allergen Reactions  . Predicort [Prednisolone] Other (See Comments)    Stomach pain  . Ciprofloxacin     GI upset  . Hydrochlorothiazide Other (See Comments)    Dehydration  . Hydrocodone     Stomach upset  . Hydrocodone-Acetaminophen     Stomach upset  . Hydrocodone-Acetaminophen Nausea Only  . Loratadine Other (See Comments)    Other reaction(s): Unknown  . Sulfa Antibiotics Other (See Comments)    Cannot recall  . Sulfacetamide Sodium Other (See Comments)    Cannot recall  . Sulfasalazine     Other reaction(s): Other (See Comments) Cannot recall  . Hydrocodone-Acetaminophen Nausea Only  . Penicillins Hives and Rash    Has patient had a PCN reaction  causing immediate rash, facial/tongue/throat swelling, SOB or lightheadedness with hypotension: YES Has patient had a PCN reaction causing severe rash involving mucus membranes or skin necrosis: NO Has patient had a PCN reaction that required hospitalization NO Has patient had a PCN reaction occurring within the last 10 years:NO If all of the above answers are "NO", then may proceed with Cephalosporin use.    VITALS:  Blood pressure 130/63, pulse 62, temperature 98.3 F (36.8 C), temperature source Oral, resp. rate 16, height 5\' 10"  (1.778 m), weight 106 kg, SpO2 93 %.  PHYSICAL EXAMINATION:   Physical Exam  GENERAL:  73 y.o.-year-old patient lying in the bed with no acute distress.  EYES: Pupils equal, round, reactive to light and accommodation. No scleral icterus. Extraocular muscles intact.  HEENT: Head atraumatic, normocephalic. Oropharynx and nasopharynx clear.  NECK:  Supple, no jugular venous distention. No thyroid enlargement, no tenderness.  LUNGS: Normal breath sounds bilaterally, no wheezing, rales, rhonchi. No use of accessory muscles of respiration.  CARDIOVASCULAR: Irregularly irregular.  Tachycardia ABDOMEN: Soft, nontender, nondistended. Bowel sounds present. No organomegaly or mass.  EXTREMITIES: No cyanosis, clubbing or edema b/l.    NEUROLOGIC: Cranial nerves II through XII are intact. No focal Motor or sensory deficits b/l.   PSYCHIATRIC: The patient is alert and oriented x 3.  SKIN: No obvious rash, lesion, or ulcer.   LABORATORY PANEL:   CBC Recent Labs  Lab 06/14/18 0421  WBC 7.4  HGB 12.4*  HCT 37.9*  PLT 136*   ------------------------------------------------------------------------------------------------------------------ Chemistries  Recent Labs  Lab 06/12/18 0546  06/14/18 0421  NA  141   < > 140  K 4.4   < > 4.3  CL 109   < > 110  CO2 25   < > 22  GLUCOSE 122*   < > 104*  BUN 37*   < > 35*  CREATININE 2.63*   < > 2.52*  CALCIUM 9.2   < >  8.9  MG 2.6*  --   --   AST 17  --   --   ALT 20  --   --   ALKPHOS 119  --   --   BILITOT 1.4*  --   --    < > = values in this interval not displayed.   ------------------------------------------------------------------------------------------------------------------  Cardiac Enzymes Recent Labs  Lab 06/12/18 1722  TROPONINI 0.06*   ------------------------------------------------------------------------------------------------------------------  RADIOLOGY:  No results found.   ASSESSMENT AND PLAN:   Ronald [name]  is a 73 y.o. male with a known history of CAD status post CABG in 1999 and 2007, stents placed in 2017, [name], history of ischemic cardiomyopathy, [name], [name]  *  New onset A. Fib/A Flutter On amiodarone drip.  Frequent episodes of atrial fibrillation.  Discussed with Dr. Rockey Situ of cardiology.  Increase amiodarone rate.  On Xarelto.  *  CAD-with stable angina symptoms -Cardiology consulted and appreciate input.  No further work-up needed at this time. -Most recent cardiac catheterization was from February  2018  *  Hypertension-on metoprolol, Imdur, norvasc  *   [name].  Stable  *  GERD-Protonix  *  DVT prophylaxis-already on heparin drip  All the records are reviewed and case discussed with Care Management/Social Worker Management plans discussed with the patient, family and they are in agreement.  CODE STATUS: DO NOT RESUSCITATE and DO NOT INTUBATE  TOTAL TIME TAKING CARE OF THIS PATIENT:  35 minutes.   POSSIBLE D/C IN 1-2 DAYS, DEPENDING ON CLINICAL CONDITION.  Leia Alf Laiza Veenstra M.D on 06/15/2018 at 12:36 PM  Between 7am to 6pm - Pager - [phone]  After 6pm go to [email or web address] - password [email or web address] [facility]  Office   [phone]  CC: Primary care physician; [name]., MD  Note: This dictation was prepared with Dragon dictation along with smaller phrase technology. Any transcriptional errors that result from this process are unintentional.

## 2018-06-16 DIAGNOSIS — I420 Dilated cardiomyopathy: Secondary | ICD-10-CM

## 2018-06-16 MED ORDER — AMIODARONE HCL 400 MG PO TABS: 400.0000 mg | ORAL_TABLET | Freq: Two times a day (BID) | ORAL | 0 refills | Status: DC

## 2018-06-16 MED ORDER — RIVAROXABAN 15 MG PO TABS: 15.0000 mg | ORAL_TABLET | Freq: Every day | ORAL | 0 refills | Status: DC

## 2018-06-16 MED ORDER — AMIODARONE HCL 200 MG PO TABS
400.0000 mg | ORAL_TABLET | Freq: Two times a day (BID) | ORAL | Status: DC
  Administered 2018-06-16: 400 mg via ORAL
  Filled 2018-06-16: qty 2

## 2018-06-16 MED ORDER — METOPROLOL TARTRATE 100 MG PO TABS: 100.0000 mg | ORAL_TABLET | Freq: Two times a day (BID) | ORAL | 0 refills | Status: DC

## 2018-06-16 NOTE — Progress Notes (Signed)
Progress Note  Patient Name: [name] Date of Encounter: 06/16/2018  Primary Cardiologist: Lauree Chandler, MD   Subjective   Metoprolol increased up to 100 twice daily yesterday Continued on amiodarone infusion all day and through the evening No further atrial fibrillation Will transition to oral amiodarone today  Inpatient Medications    Scheduled Meds: . allopurinol 100 mg Oral QHS  . atorvastatin 40 mg Oral q1800  . famotidine 20 mg Oral Daily  . fluticasone 2 spray Each Nare Daily  . isosorbide mononitrate 30 mg Oral Daily  . magnesium oxide 400 mg Oral BID  . metoprolol tartrate 100 mg Oral BID  . multivitamin with minerals 1 tablet Oral q morning - 10a  . pantoprazole 40 mg Oral Daily  . polyethylene glycol 17 g Oral Daily  . rivaroxaban 15 mg Oral Q supper  . sodium bicarbonate 650 mg Oral BID  . sodium chloride flush 3 mL Intravenous Q12H  . sodium chloride flush 3 mL Intravenous Q12H   Continuous Infusions: . sodium chloride   . amiodarone 60 mg/hr (06/15/18 0800)   PRN Meds: acetaminophen **OR** acetaminophen, loratadine, nitroGLYCERIN, ondansetron **OR** ondansetron (ZOFRAN) IV, sodium chloride flush, sodium chloride flush     Vital Signs    Today's Vitals   06/16/18 0521 06/16/18 0750 06/16/18 1002 06/16/18 1210  BP: (!) 143/64 137/71    Pulse: 66 65    Resp: 20 18    Temp: 98.4 F (36.9 C) 98.3 F (36.8 C)    TempSrc: Oral Oral    SpO2: 93% 94%    Weight:      Height:      PainSc:   0-No pain 0-No pain   Body mass index is 33.91 kg/m.  Intake/Output Summary (Last 24 hours) at 06/16/2018 1550 Last data filed at 06/16/2018 1224    Gross per 24 hour  Intake 679.71 ml  Output 1400 ml  Net -720.29 ml   Last 3 Weights 06/15/2018 06/12/2018 06/11/2018  Weight (lbs) 236 lb 4.8 oz 233 lb 11 oz 238 lb 9 oz  Weight (kg) 107.185 kg 106 kg 108.21 kg      Telemetry    Past 24 hours normal sinus rhythm  personally Reviewed  ECG    No new tracing.  Physical Exam   Constitutional:  oriented to person, place, and time. No distress.  HENT:  Head: Grossly normal Eyes:  no discharge. No scleral icterus.  Neck: No JVD, no carotid bruits  Cardiovascular: Regular rate and rhythm, no murmurs appreciated Pulmonary/Chest: Clear to auscultation bilaterally, no wheezes or rails Abdominal: Soft.  no distension.  no tenderness.  Musculoskeletal: Normal range of motion Neurological:  normal muscle tone. Coordination normal. No atrophy Skin: Skin warm and dry Psychiatric: normal affect, pleasant   Labs    Chemistry LastLabs  Recent Labs  Lab 06/12/18 0546 06/13/18 0457 06/14/18 0421  NA 141 140 140  K 4.4 4.5 4.3  CL 109 109 110  CO2 25 23 22   GLUCOSE 122* 110* 104*  BUN 37* 37* 35*  CREATININE 2.63* 2.49* 2.52*  CALCIUM 9.2 9.0 8.9  PROT 6.1*  --   --   ALBUMIN 3.8  --   --   AST 17  --   --   ALT 20  --   --   ALKPHOS 119  --   --   BILITOT 1.4*  --   --   GFRNONAA 23* 24* 24*  GFRAA 27* 28* 28*  ANIONGAP 7 8 8        Hematology LastLabs  Recent Labs  Lab 06/12/18 0546 06/13/18 0457 06/14/18 0421  WBC 7.0 7.0 7.4  RBC 4.51 4.36 4.43  HGB 12.8* 12.1* 12.4*  HCT 39.8 37.5* 37.9*  MCV 88.2 86.0 85.6  MCH 28.4 27.8 28.0  MCHC 32.2 32.3 32.7  RDW 14.7 14.5 14.6  PLT 144* 141* 136*      Cardiac Enzymes LastLabs        Recent Labs  Lab 06/11/18 0352 06/12/18 0546 06/12/18 1130 06/12/18 1722  TROPONINI <0.03 0.07* 0.06* 0.06*      LastLabs  No results for input(s): TROPIPOC in the last 168 hours.     BNP LastLabs  No results for input(s): BNP, PROBNP in the last 168 hours.     DDimer  Elie Confer  No results for input(s): DDIMER in the last 168 hours.     Radiology    ImagingResults(Last48hours)  No results found.    Cardiac Studies   LHC 07/28/2017: Conclusion     Ost RCA to Prox RCA lesion is 95%  stenosed.  Prox RCA lesion is 100% stenosed.  Prox Cx lesion is 100% stenosed.  Prox LAD lesion is 100% stenosed.  Origin lesion is 100% stenosed.  Origin lesion is 100% stenosed.  Previously placed Dist Graft to Insertion stent (unknown type) is widely patent.  Origin to Prox Graft lesion is 10% stenosed.  Prox Graft lesion is 30% stenosed.  Mid Graft to Dist Graft lesion is 30% stenosed.  Mid Graft lesion is 50% stenosed.  Significant native CAD with total occlusion of the LAD after the takeoff of the first diagonal vessel; total occlusion of the proximal left circumflex coronary artery; and total occlusion of the proximal RCA with antegrade bridging collaterals.  Patent LIMA to LAD.  Patent Y vein graft from the 1996 surgery which supplies the diagonal vessel and distal circumflex marginal vessel. There is diffuse narrowing of 50% in the midportion of the Y graft supplying the diagonal vessel and a patent distal stent extending to the ostium of the diagonal. The Y graft supplying the distal marginal is free of significant disease and has mild luminal irregularity.  Occluded vein graft which had supplied the distal RCA from the 1996 surgery.  Patent SVG supplying the distal RCA from the 2007 surgery with a stent in the proximal portion of the graft with 30% narrowings in the proximal and mid segment.  Occluded vein graft from 2007 which appears to also have been stented.  LVEDP 18 mm  RECOMMENDATION: Increase medical therapy.  __________  Echo 07/2017: Study Conclusions  - Left ventricle: The cavity size was mildly dilated. Systolic function was normal. The estimated ejection fraction was in the range of 55% to 60%. Doppler parameters are consistent with abnormal left ventricular relaxation (grade 1 diastolic dysfunction).  Echo (06/12/2018): - Left ventricle: The cavity size was normal. There was moderate concentric hypertrophy. Systolic  function was moderately reduced. The estimated ejection fraction was in the range of 35% to 40%. Images were inadequate for LV wall motion assessment. The study was not technically sufficient to allow evaluation of LV diastolic dysfunction due to atrial fibrillation. - Mitral valve: There was mild regurgitation. - Left atrium: The atrium was mildly dilated. - Pulmonary arteries: Systolic pressure was mildly increased. PA peak pressure: 35 mm Hg (S).   Patient Profile     73 y.o. male with history of CAD s/p 4-vessel CABG in 1999 s/p  re-do CABG in 2007 s/p PCI/DES in 2016 of the VG-OM s/p PCI/DES in 2017 to the VG-diag, ICM, [name], COPD, DM2, AAA, DDD, esophageal stricture, GERD, HTN, HLD, RAS, and PVCs admitted with new onset Afib/flutter.  Assessment & Plan    Paroxysmal atrial fibrillation/flutter Flutter on arrival lasting several hours Also has had paroxysmal atrial fibrillation during his hospital course Treated with aggressive amiodarone load with IV infusion Rapid titration up of his metoprolol tartrate up to 100 twice daily Normal sinus rhythm restored past 24 hours, he is asymptomatic and feels well --For breakthrough atrial fibrillation we have recommended he take amiodarone 200 and metoprolol 50 --Would continue amiodarone 400 twice daily for 5 more days then down to 200 twice daily 2 weeks then down to 200 daily  Coronary artery disease No further ischemic work-up at this time, denies any anginal symptoms Echocardiogram repeat limited for EF,, done in normal sinus rhythm again confirming moderately depressed ejection fraction -He prefers to have ischemic work-up done as outpatient with Dr. Angelena Form He is not having any anginal symptoms.  Defer to Dr. Angelena Form whether he would like Myoview or catheterization  Chronic systolic heart failure LVEF moderately reduced  Unable to exclude tachycardia mediated Denies any anginal symptoms Recommend he take  Lasix 3 days a week given low ejection fraction  Acute on chronic kidney disease Stable , CR 2.5   Case discussed with nursing and with hospitalist service Long discussion with patient concerning medications as detailed above, plan for any breakthrough atrial fibrillation Also discussed ischemic work-up could be done as outpatient Discussed various types of medications that could be used to augment ejection fraction  Total encounter time more than 45 minutes  Greater than 50% was spent in counseling and coordination of care with the patient   For questions or updates, please contact [place] Please consult [email or web address] for contact info under [facility] Cardiology.    Signed, Ida Rogue, MD  06/16/2018

## 2018-06-16 NOTE — Progress Notes (Signed)
Talked to Dr. Darvin Neighbours about patient ambulation still with amiodarone drip. Patient's HR went up to 125 but goes back to NSR at 60-90's as soon as patient rested. Order have his first dose of oral amiodarone at 1211, will discontinue Amiodarone drip 2 hours after. RN will continue to monitor.

## 2018-06-16 NOTE — Progress Notes (Signed)
Went over discharge instructions with the patient and family including new medication and follow-up appointment. Xarelto coupon was provided by CM. Discontinue peripheral IV and telemetry monitor. Volunteer was here to transport patient.

## 2018-06-16 NOTE — Care Management Important Message (Signed)
Copy of signed Medicare IM left with patient in room. 

## 2018-06-16 NOTE — Discharge Instructions (Signed)
Heart healthy diet  Do not lift heavy weights or exertional activity till you follow up with your heart doctor

## 2018-06-16 NOTE — Progress Notes (Addendum)
Error

## 2018-06-16 NOTE — Progress Notes (Signed)
Rivaroxaban Counseling  Discussed with patient and wife new start Rivaroxaban 15 mg PO with meals for atrial fibrillation. Discussed indication, how to take, importance of taking with meals for increased absorption, concomitant OTC use, and bleeding risk. Patient and wife both understanding, and answered questions. Gave them handout for Xarelto.  Paticia Stack, PharmD Pharmacy Resident  06/16/2018 1:33 PM

## 2018-06-18 ENCOUNTER — Telehealth: Payer: Self-pay | Admitting: Physician Assistant

## 2018-06-18 NOTE — Telephone Encounter (Signed)
Patient was recently discharged 2 days ago for atrial fibrillation with RVR.  Prior to discharge, he did convert to sinus rhythm.  He was discharged on 400 mg twice daily of amiodarone for 5 days before trending down to 200 mg twice daily thereafter.  Yesterday afternoon, he felt recurrent palpitation and his heart rate went up to the 130s.  Heart rate improved after the nighttime dose of metoprolol, however went up to 130 again this afternoon.  He is currently on 100 mg twice daily of metoprolol tartrate, I recommended for the patient to take additional 50 mg metoprolol tartrate this afternoon.  I did not give him any additional dose of amiodarone as he is already on very high dose, and I am not confident that an additional dose of of amiodarone will help him.  Otherwise he is on systemic anticoagulation at home.  I instructed the wife to call her office tomorrow morning to schedule follow-up appointment either for tomorrow or Tuesday.  Hilbert Corrigan PA Pager: [phone]

## 2018-06-19 ENCOUNTER — Other Ambulatory Visit: Payer: Self-pay

## 2018-06-19 ENCOUNTER — Telehealth: Payer: Self-pay | Admitting: Cardiovascular Disease

## 2018-06-19 ENCOUNTER — Telehealth: Payer: Self-pay

## 2018-06-19 NOTE — Telephone Encounter (Signed)
Transition Care Management Follow-up Telephone Call  Date of discharge and from where: [facility] on 06/16/18.  How have you been since you were released from the hospital? Doing ok. Heart rate was up to 130 yesterday. Wife called into [facility] in [place]. Was told to give him a half tablet around 8 and then took 1 whole tablet @ 10 and it dropped his heart rate. No trouble with elevated heart rate today. Pt is going to the heart clinic tomorrow to f/u. Currently HR was 57 while on the phone. No other s/s.   Any questions or concerns? Concerned about if heart rate will jump up again.  Items Reviewed:  Did the pt receive and understand the discharge instructions provided? Yes   Medications obtained and verified? Yes   Any new allergies since your discharge? No   Dietary orders reviewed? Yes  Do you have support at home? Yes   Other (ie: DME, Home Health, etc) N/A  Functional Questionnaire: (I = Independent and D = Dependent)  Bathing/Dressing- I   Meal Prep- I  Eating- I  Maintaining continence- I  Transferring/Ambulation- I  Managing Meds- D, wife manages.   Follow up appointments reviewed:    PCP Hospital f/u appt confirmed? Yes  Scheduled to see Dr. [name] on 06/22/18 @ 1:20 PM.  [facility] f/u appt confirmed? Yes   Are transportation arrangements needed? No  If their condition worsens, is the pt aware to call  their PCP or go to the ED? Yes  Was the patient provided with contact information for the PCP's office or ED? Yes  Was the pt encouraged to call back with questions or concerns? Yes

## 2018-06-19 NOTE — Telephone Encounter (Signed)
New Message   STAT if HR is under 50 or over 120 (normal HR is 60-100 beats per minute)  1) What is your heart rate? At the moment its 37 taken while speaking with patients wife  2) Do you have a log of your heart rate readings (document readings)? Patients wife states that they do not have an official log but every time they have checked over the weekend its been 125-130  Do you have any other symptoms? No   Patient spoke with Almyra Deforest over the weekend. There is a phone note in they system. Patient was advised to be seen not showing any soon availability. Please call to discuss.

## 2018-06-19 NOTE — Telephone Encounter (Signed)
Spoke with patient's wife who states patient's HR this morning is 66 bpm, which is the lowest it has been since d/c from [facility] on 1/16. He took extra 1/2 Metoprolol (50 mg) last night per Janan Ridge, PA advice. Patient currently denies symptoms but needs follow-up appointment in the next 2 days. Wife requests appointment with Dr. Angelena Form and I advised he is unavailable this week. I offered her an appointment with Jory Sims, NP tomorrow or an appointment with [facility] clinic. Wife requests appointment with [facility] clinic and is scheduled to see Roderic Palau, NP tomorrow at 2 pm. I advised that patient continue current medications, drink 60 oz water, avoid caffeine, and call back with questions or concerns prior to appointment tomorrow. Wife verbalized understanding and agreement with plan and thanked me for the call.

## 2018-06-19 NOTE — Patient Outreach (Signed)
[facility]) Care Management  06/19/2018  [place] 09-06-1945 [id number]  EMMI: general discharge Referral date: 06/19/18 Referral reason: Know who to call about changes.  Insurance: [employer] Day # 1 Attempt #1  Telephone call to patient regarding EMMI general discharge red alert. Unable to reach patient. Contact states patient is not available. HIPAA compliant message left with call back phone number.    PLAN: RNCM will attempt 2nd telephone call to patient within 4 business days.  RNCM will send outreach letter to patient to attempt contact   Quinn Plowman RN,BSN,CCM [facility] Telephonic  [phone]

## 2018-06-20 ENCOUNTER — Ambulatory Visit (HOSPITAL_COMMUNITY)
Admission: RE | Admit: 2018-06-20 | Discharge: 2018-06-20 | Disposition: A | Discharge status: Home / Self Care | Payer: Medicare Other | Source: Ambulatory Visit | Attending: Nurse Practitioner | Admitting: Nurse Practitioner

## 2018-06-20 ENCOUNTER — Encounter (HOSPITAL_COMMUNITY): Payer: Self-pay | Admitting: Nurse Practitioner

## 2018-06-20 VITALS — BP 174/86 | HR 62 | Ht 70.0 in | Wt 239.0 lb

## 2018-06-20 DIAGNOSIS — E785 Hyperlipidemia, unspecified: Secondary | ICD-10-CM | POA: Diagnosis not present

## 2018-06-20 DIAGNOSIS — I4819 Other persistent atrial fibrillation: Secondary | ICD-10-CM | POA: Diagnosis not present

## 2018-06-20 DIAGNOSIS — Z880 Allergy status to penicillin: Secondary | ICD-10-CM | POA: Diagnosis not present

## 2018-06-20 DIAGNOSIS — Z7901 Long term (current) use of anticoagulants: Secondary | ICD-10-CM | POA: Insufficient documentation

## 2018-06-20 DIAGNOSIS — Z882 Allergy status to sulfonamides status: Secondary | ICD-10-CM | POA: Insufficient documentation

## 2018-06-20 DIAGNOSIS — N184 Chronic kidney disease, stage 4 (severe): Secondary | ICD-10-CM | POA: Diagnosis not present

## 2018-06-20 DIAGNOSIS — I2510 Atherosclerotic heart disease of native coronary artery without angina pectoris: Secondary | ICD-10-CM | POA: Insufficient documentation

## 2018-06-20 DIAGNOSIS — Z955 Presence of coronary angioplasty implant and graft: Secondary | ICD-10-CM | POA: Diagnosis not present

## 2018-06-20 DIAGNOSIS — I130 Hypertensive heart and chronic kidney disease with heart failure and stage 1 through stage 4 chronic kidney disease, or unspecified chronic kidney disease: Secondary | ICD-10-CM | POA: Diagnosis not present

## 2018-06-20 DIAGNOSIS — Z951 Presence of aortocoronary bypass graft: Secondary | ICD-10-CM | POA: Insufficient documentation

## 2018-06-20 DIAGNOSIS — Z881 Allergy status to other antibiotic agents status: Secondary | ICD-10-CM | POA: Diagnosis not present

## 2018-06-20 DIAGNOSIS — I714 Abdominal aortic aneurysm, without rupture: Secondary | ICD-10-CM | POA: Diagnosis not present

## 2018-06-20 DIAGNOSIS — Z87891 Personal history of nicotine dependence: Secondary | ICD-10-CM | POA: Insufficient documentation

## 2018-06-20 DIAGNOSIS — Z888 Allergy status to other drugs, medicaments and biological substances status: Secondary | ICD-10-CM | POA: Insufficient documentation

## 2018-06-20 DIAGNOSIS — J449 Chronic obstructive pulmonary disease, unspecified: Secondary | ICD-10-CM | POA: Diagnosis not present

## 2018-06-20 DIAGNOSIS — Z79899 Other long term (current) drug therapy: Secondary | ICD-10-CM | POA: Insufficient documentation

## 2018-06-20 DIAGNOSIS — K219 Gastro-esophageal reflux disease without esophagitis: Secondary | ICD-10-CM | POA: Insufficient documentation

## 2018-06-20 DIAGNOSIS — Z8249 Family history of ischemic heart disease and other diseases of the circulatory system: Secondary | ICD-10-CM | POA: Insufficient documentation

## 2018-06-20 DIAGNOSIS — E1122 Type 2 diabetes mellitus with diabetic chronic kidney disease: Secondary | ICD-10-CM | POA: Insufficient documentation

## 2018-06-20 DIAGNOSIS — I5042 Chronic combined systolic (congestive) and diastolic (congestive) heart failure: Secondary | ICD-10-CM | POA: Insufficient documentation

## 2018-06-20 NOTE — Patient Instructions (Signed)
Amiodarone  400mg  bid until tomorrow 1/22 Then 200mg  bid for 2 weeks (2/5) then 200mg  daily  Call Thursday with BP readings.

## 2018-06-20 NOTE — Telephone Encounter (Signed)
Agree. Thanks

## 2018-06-20 NOTE — Progress Notes (Signed)
Primary Care Physician: [name]., MD Referring Physician:  [facility] triage Cardiologist: Dr. Alexander Bergeron is a 73 y.o. male with a h/o  CAD status post CABG in 1999 and 2007, stents placed in 2017, [name], history of ischemic cardiomyopathy, [name], COPD not on home oxygen and low back pain presents to hospital secondary to onset of chest pressure and palpitations.  He was found to be in new onset afib with RVR. He started noticing symptoms of fatigue and shortness of breath HE was loaded on amiodarone and d/c home 11/17. He was discharged on amiodarone 400 mg bid and carvedilol was stopped and metoprolol started. His amlodipine, asa and plavix were stopped at d/c.   He called in 1/19 with elevated HR's in the 130's. He as advised to take an extra 50 mg metoprolol. The next am HR's were in the 60's. He is in SR today and feels improved. His BP is elevated today but the pt states he did not sleep well last night. He has been checking his BP daily and this is the first time he has noted it to be elevated. HE is not using excessive caffeine, alcohol, no tobacco.  Today, he denies symptoms of palpitations, chest pain, shortness of breath, orthopnea, PND, lower extremity edema, dizziness, presyncope, syncope, or neurologic sequela. The patient is tolerating medications without difficulties and is otherwise without complaint today.   Past Medical History:  Diagnosis Date  . AAA (abdominal aortic aneurysm) ([place])    a. 3cm by [place] 2015.  [name] Arthritis    "hips; back" (12/13/2014)  . CAD (coronary artery disease) 2007   a. s/p CABG- IMA-LAD, VG-Cx, VG-RCA, VG-diag in 1999. B. sp redo CABG- VG-OM, VG-RCA in 2007 due to VG disease. c. NSTEMI 11/2014 s/p DES to SVG-OM from the Y graft.d. PTCA/DES x 1 distal body of SVG to Diagonal.09/2015  . Chronic combined systolic and diastolic CHF (congestive heart failure) ([place])    a. remote EF 40-45%  in 2006. b. Normal EF 2014. b. Echo 07/2016 EF 45-50%, grade 1 DD.  [name] Chronic lower back pain   . CKD (chronic kidney disease), stage IV ([place])   . COPD (chronic obstructive pulmonary disease) ([place])   . Deafness in left ear   . Degenerative disc disease, lumbar   . Dilated cardiomyopathy ([place]) 10/07/2015  . Emphysema   . Esophageal stricture 07/02/1998   EGD  . GERD (gastroesophageal reflux disease)   . History of gout    "last flareup was in 2007" (12/13/2014)  . History of hiatal hernia   . Hyperlipidemia   . Hypertension   . Ischemic cardiomyopathy 2006   EF 40% to 50% by 2D echo in 2006;  Echo 12/31/12: Mild LVH, EF 50-55%, normal wall motion.   [name] PVC's (premature ventricular contractions)   . Renal artery stenosis ([place])    a. noted on CT 2008.  . Type II diabetes mellitus ([place])    Diet control   . Walking pneumonia 1990's   Past Surgical History:  Procedure Laterality Date  . CARDIAC CATHETERIZATION  "several"  . CARDIAC CATHETERIZATION N/A 12/13/2014   Procedure: Left Heart Cath and Coronary Angiography;  Surgeon: Jettie Booze, MD;  Location: [facility];  Service: Cardiovascular;  Laterality: N/A;  . CARDIAC CATHETERIZATION  12/13/2014   Procedure: Coronary Stent Intervention;  Surgeon: Jettie Booze, MD;  Location: [facility];  Service: Cardiovascular;;  . CARDIAC CATHETERIZATION N/A 10/07/2015   Procedure: Left Heart Cath and Cors/Grafts Angiography;  Surgeon: Burnell Blanks, MD;  Location: [facility];  Service: Cardiovascular;  Laterality: N/A;  . CARDIAC CATHETERIZATION N/A 10/07/2015   Procedure: Coronary Stent Intervention;  Surgeon: Burnell Blanks, MD;  Location: [facility];  Service: Cardiovascular;  Laterality: N/A;  . CORONARY ANGIOPLASTY  "several"  . CORONARY ANGIOPLASTY WITH STENT PLACEMENT  2005; 12/13/2014   "2; 1"  . CORONARY ARTERY BYPASS GRAFT  1996   CABG X5  . CORONARY ARTERY BYPASS GRAFT  March 2007    CABG X3  . ESOPHAGOGASTRODUODENOSCOPY (EGD) WITH ESOPHAGEAL DILATION  2000  . GREEN LIGHT LASER TURP (TRANSURETHRAL RESECTION OF PROSTATE  2000's   "not cancerous"  . HERNIA REPAIR    . LAPAROSCOPIC CHOLECYSTECTOMY    . LEFT HEART CATH AND CORS/GRAFTS ANGIOGRAPHY N/A 07/28/2017   Procedure: LEFT HEART CATH AND CORS/GRAFTS ANGIOGRAPHY;  Surgeon: Troy Sine, MD;  Location: [facility];  Service: Cardiovascular;  Laterality: N/A;  . LUNG SURGERY  1996   "S/P CABG, had to put staple in lung after it had collapsed"  . UMBILICAL HERNIA REPAIR     w/chole    Current Outpatient Medications  Medication Sig Dispense Refill  . allopurinol (ZYLOPRIM) 100 MG tablet TAKE 1 TABLET BY MOUTH AT  BEDTIME 90 tablet 3  . amiodarone (PACERONE) 400 MG tablet Take 1 tablet (400 mg total) by mouth 2 (two) times daily. 60 tablet 0  . atorvastatin (LIPITOR) 40 MG tablet Take 1 tablet (40 mg total) by mouth daily. 90 tablet 3  . furosemide (LASIX) 40 MG tablet Take 40 mg by mouth daily as needed for fluid or edema.   6  . isosorbide mononitrate (IMDUR) 30 MG 24 hr tablet Take 1 tablet (30 mg total) by mouth daily. 90 tablet 3  . loratadine (CLARITIN) 10 MG tablet Take 10 mg by mouth daily as needed for allergies.    . magnesium oxide (MAG-OX) 400 MG tablet TAKE 1 TABLET BY MOUTH TWICE A DAY 180 tablet 3  . metoprolol tartrate (LOPRESSOR) 100 MG tablet Take 1 tablet (100 mg total) by mouth 2 (two) times daily. 60 tablet 0  . Multiple Vitamin (MULTIVITAMIN) tablet Take 1 tablet by mouth every morning.     . nitroGLYCERIN (NITROSTAT) 0.4 MG SL tablet Place 0.4 mg under the tongue every 5 (five) minutes as needed for chest pain (Up to 3 times).    . pantoprazole (PROTONIX) 40 MG tablet TAKE 1 TABLET BY MOUTH  DAILY 90 tablet 3  . ranitidine (ZANTAC) 150 MG tablet TAKE 1 TABLET BY MOUTH AT  BEDTIME 90 tablet 3  . Rivaroxaban (XARELTO) 15 MG TABS tablet Take 1 tablet (15 mg total) by mouth daily with supper.  30 tablet 0  . sodium bicarbonate 650 MG tablet Take 650 mg by mouth 2 (two) times daily.     No current facility-administered medications for this encounter.     Allergies  Allergen Reactions  . Predicort [Prednisolone] Other (See Comments)    Stomach pain  . Ciprofloxacin     GI upset  . Hydrochlorothiazide Other (See Comments)    Dehydration  . Hydrocodone     Stomach upset  . Hydrocodone-Acetaminophen     Stomach upset  . Hydrocodone-Acetaminophen Nausea Only  . Loratadine Other (See Comments)    Other reaction(s): Unknown  . Sulfa Antibiotics Other (See  Comments)    Cannot recall  . Sulfacetamide Sodium Other (See Comments)    Cannot recall  . Sulfasalazine     Other reaction(s): Other (See Comments) Cannot recall  . Hydrocodone-Acetaminophen Nausea Only  . Penicillins Hives and Rash    Has patient had a PCN reaction causing immediate rash, facial/tongue/throat swelling, SOB or lightheadedness with hypotension: YES Has patient had a PCN reaction causing severe rash involving mucus membranes or skin necrosis: NO Has patient had a PCN reaction that required hospitalization NO Has patient had a PCN reaction occurring within the last 10 years:NO If all of the above answers are "NO", then may proceed with Cephalosporin use.    Social History   Socioeconomic History  . Marital status: Married    Spouse name: Not on file  . Number of children: 2  . Years of education: Not on file  . Highest education level: 8th grade  Occupational History  . Occupation: Retired  Scientific laboratory technician  . Financial resource strain: Not hard at all  . Food insecurity:    Worry: Never true    Inability: Never true  . Transportation needs:    Medical: No    Non-medical: No  Tobacco Use  . Smoking status: Former Smoker    Packs/day: 3.00    Years: 20.00    Pack years: 60.00    Types: Cigarettes    Last attempt to quit: 07/25/1986    Years since quitting: 31.9  . Smokeless tobacco: Never  Used  Substance and Sexual Activity  . Alcohol use: No    Alcohol/week: 0.0 standard drinks  . Drug use: No  . Sexual activity: Not Currently  Lifestyle  . Physical activity:    Days per week: 0 days    Minutes per session: 0 min  . Stress: Not at all  Relationships  . Social connections:    Talks on phone: Patient refused    Gets together: Patient refused    Attends religious service: Patient refused    Active member of club or organization: Patient refused    Attends meetings of clubs or organizations: Patient refused    Relationship status: Patient refused  . Intimate partner violence:    Fear of current or ex partner: Patient refused    Emotionally abused: Patient refused    Physically abused: Patient refused    Forced sexual activity: Patient refused  Other Topics Concern  . Not on file  Social History Narrative   Did auto salvage work.   Lives at home with his wife.  Independent at baseline.    Family History  Problem Relation Age of Onset  . Heart attack Mother        MI  . Stroke Mother   . Heart disease Mother   . Hypertension Mother   . Hyperlipidemia Mother   . Heart disease Father   . Rheumatic fever Father   . Colon cancer Neg Hx     ROS- All systems are reviewed and negative except as per the HPI above  Physical Exam: Vitals:   06/20/18 1404  BP: (!) 174/86  Pulse: 62  Weight: 108.4 kg  Height: 5\' 10"  (1.778 m)   Wt Readings from Last 3 Encounters:  06/20/18 108.4 kg  06/15/18 107.2 kg  05/15/18 109.3 kg    Labs: Lab Results  Component Value Date   NA 140 06/14/2018   K 4.3 06/14/2018   CL 110 06/14/2018   CO2 22 06/14/2018   GLUCOSE  104 (H) 06/14/2018   BUN 35 (H) 06/14/2018   CREATININE 2.52 (H) 06/14/2018   CALCIUM 8.9 06/14/2018   PHOS 3.2 06/12/2018   MG 2.6 (H) 06/12/2018   Lab Results  Component Value Date   INR 1.64 06/12/2018   Lab Results  Component Value Date   CHOL 115 11/22/2017   HDL 45 11/22/2017   LDLCALC  56 11/22/2017   TRIG 68 11/22/2017     GEN- The patient is well appearing, alert and oriented x 3 today.   Head- normocephalic, atraumatic Eyes-  Sclera clear, conjunctiva pink Ears- hearing intact Oropharynx- clear Neck- supple, no JVP Lymph- no cervical lymphadenopathy Lungs- Clear to ausculation bilaterally, normal work of breathing Heart- Regular rate and rhythm, no murmurs, rubs or gallops, PMI not laterally displaced GI- soft, NT, ND, + BS Extremities- no clubbing, cyanosis, or edema MS- no significant deformity or atrophy Skin- no rash or lesion Psych- euthymic mood, full affect Neuro- strength and sensation are intact  EKG-SR with occasional PVC, pr int 196 ms, qrs int 100 ms, qtc 466 ms Echo-Study Conclusions  - Left ventricle: The cavity size was normal. There was moderate   concentric hypertrophy. Systolic function was moderately reduced.   The estimated ejection fraction was in the range of 35% to 40%.   Images were inadequate for LV wall motion assessment. The study   was not technically sufficient to allow evaluation of LV   diastolic dysfunction due to atrial fibrillation. - Mitral valve: There was mild regurgitation. - Left atrium: The atrium was mildly dilated. - Pulmonary arteries: Systolic pressure was mildly increased. PA   peak pressure: 35 mm Hg (S).   Assessment and Plan: 1. Persistent  afib General education re afib  Now in SR and continues with  loading on amiodarone The wife did not receive d/c instructions on how to taper amiodarone He will continue with 400 mg bid thru 1/22, then 200 mg bid x 2 weeks and then 200 mg daily Continue metoprolol as prescribed Can take an extra 50 mg metoprolol for break through episodes  2. CHA2DS2VASc score of at least 5 Continue xarelto 15 mg daily  3. HTN Elevated today Wife will let office know his readings over the next 2 days  If remains elevated, he may need to restart amlodipine 5 mg daily that was  stopped at d/c.  4. CAD Stable  No anginal symptoms  F/u with Dr. [name] 1/23, Dr. Angelena Form 2/10 afib clinic as needed  Geroge Baseman. Amyri Frenz, [facility] [address] [place], [place] [address] [phone]

## 2018-06-21 ENCOUNTER — Other Ambulatory Visit: Payer: Self-pay

## 2018-06-21 NOTE — Patient Outreach (Signed)
[facility]) Care Management  06/21/2018  [place] 18-Aug-1945 [id number]  EMMI: general discharge Referral date: 06/19/18 Referral reason: Know who to call about changes.  Insurance: [employer] Day # 1  Telephone call to patient regarding EMMI general discharge red alert. RNCM spoke with patients wife and designated party release, [name] Duffy.  HIPAA verified with wife for patient. Explained reason for call. Wife states patient went to the Atrial fibrillation clinic on yesterday. She states patient received education related to Atrial fibrillation and education material.  She states patient understands what signs/ symptoms to look for and has an action plan if heart rate increases. Wife states patient is to take 1/2 of a metoprolol.  Wife states patient had an elevation of his heart rate over the weekend. She states patient's heart rate elevated to 130. She reports patient took 1/2 metoprolol as per the action plan and his heart rate decreased. Wife states patient is checking his heart rate at least 2-3 times per day.  She reports most recent heart rate was 57. She states his heart rate ranges between 57-64.  Wife denies any further concerns/ needs for patient. RNCM advised patient to notify MD of any changes in condition prior to scheduled appointment. RNCM provided contact name and number: to 24 hour nurse advise line [phone].  RNCM verified patient aware of 911 services for urgent/ emergent needs. Wife verbalized understanding.   PLAN:  RNCM will close patient due to patient being assessed and having no further needs.   Quinn Plowman RN,BSN,CCM [facility] Telephonic  [phone]

## 2018-06-22 ENCOUNTER — Telehealth (HOSPITAL_COMMUNITY): Payer: Self-pay | Admitting: *Deleted

## 2018-06-22 ENCOUNTER — Ambulatory Visit (INDEPENDENT_AMBULATORY_CARE_PROVIDER_SITE_OTHER): Payer: Medicare Other | Admitting: Family Medicine

## 2018-06-22 VITALS — BP 174/83 | HR 61 | Temp 98.7°F | Wt 238.0 lb

## 2018-06-22 DIAGNOSIS — I25110 Atherosclerotic heart disease of native coronary artery with unstable angina pectoris: Secondary | ICD-10-CM | POA: Diagnosis not present

## 2018-06-22 DIAGNOSIS — I10 Essential (primary) hypertension: Secondary | ICD-10-CM

## 2018-06-22 DIAGNOSIS — E119 Type 2 diabetes mellitus without complications: Secondary | ICD-10-CM | POA: Diagnosis not present

## 2018-06-22 DIAGNOSIS — I420 Dilated cardiomyopathy: Secondary | ICD-10-CM | POA: Diagnosis not present

## 2018-06-22 DIAGNOSIS — I4891 Unspecified atrial fibrillation: Secondary | ICD-10-CM

## 2018-06-22 DIAGNOSIS — N184 Chronic kidney disease, stage 4 (severe): Secondary | ICD-10-CM

## 2018-06-22 NOTE — Patient Instructions (Signed)
Restart Amlodipine 5mg daily.  

## 2018-06-22 NOTE — Progress Notes (Signed)
[name]  MRN: [id number] DOB: Jun 02, 1945  Subjective:  HPI   The patient is a 73 year old male who presents for hospital follow up.  This is a transition of care follow-up.  The patient was admitted on 06/10/2018.  The patient was admitted for atrial fibrillation with rapid ventricular rate.  He was discharged on 06/16/2018.  Patient was going to be cardioverted but then would go in and out of fib so they decided not to do it.   The patient was discharged with the following medication changes: Stop Amlodipine, Aspirin, Carvedilol and Plavix.  He was started on Amioderone, Metoprolol and Xarelto.   The A Fib clinic instructed the patient and his wife on tapering down on the Amiodarone.  The wife was instructed to monitor blood pressure and if it was above 660 systolic they were going to restart the Amlodipine 5 mg daily.  Patient has not had any systolic readings lower than 630 systolic.  The patient is to call the A fib clinic today about the medicine.  The patient has also been instructed that he is to take an extra Metoprolol 50 mg for breakthrough.  Patient is also complaining of sinus problems.  He states he has coughed up some blood that he feels is from drainage that get down his throat.  The patient states that his acid reflux is started to do better.  He is however on Pantoprazole and Ranitidine.  Patient Active Problem List   Diagnosis Date Noted  . A-fib ([place]) 06/10/2018  . CKD (chronic kidney disease), stage IV ([place]) 07/29/2017  . Obesity 07/29/2017  . Unstable angina ([place]) 07/27/2017  . Atherosclerosis of native arteries of extremity with intermittent claudication ([place]) 06/25/2016  . Acute renal failure superimposed on stage 3 chronic kidney disease ([place]) 10/07/2015  . Dilated cardiomyopathy ([place]) 10/07/2015  . Coronary artery disease involving native coronary artery of native heart with unstable angina pectoris ([place])   . Allergic rhinitis 08/19/2015  . NSTEMI (non-ST  elevated myocardial infarction) ([place]) 12/13/2014  . AA (aortic aneurysm) ([place]) 10/04/2014  . Arteriosclerosis of coronary artery 10/04/2014  . Diabetes mellitus, type 2 ([place]) 10/04/2014  . Acid reflux 10/04/2014  . 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 HLD (hyperlipidemia) 10/04/2014  . BP (high blood pressure) 10/04/2014  . Osteoarthrosis 10/04/2014  . Adult BMI 30+ 10/04/2014  . 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 Urge incontinence of urine 10/24/2012  . 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 Abdominal pain 11/09/2010  . Shortness of breath 11/12/2009  . Renal artery stenosis    . Mixed hyperlipidemia 09/04/2008  . History of redo bypass grafting 09/04/2008  . Diabetes mellitus with nephropathy [facility])     Past Medical History:  Diagnosis Date  . AAA (abdominal aortic aneurysm) ([place])    a. 3cm by [place] 2015.  [name] Arthritis    "hips; back" (12/13/2014)  . CAD (coronary artery disease) 2007   a. s/p CABG- IMA-LAD, VG-Cx, VG-RCA, VG-diag in 1999. B. sp redo CABG- VG-OM, VG-RCA in 2007 due to VG disease. c. NSTEMI 11/2014 s/p DES to SVG-OM from the Y graft.d. PTCA/DES x 1 distal body of SVG to Diagonal.09/2015  . Chronic combined systolic and diastolic CHF (congestive heart failure) ([place])    a. remote EF 40-45% in 2006. b. Normal EF 2014. b. Echo 07/2016 EF 45-50%, grade 1 DD.  [name] Chronic lower back pain   . CKD (chronic  kidney disease), stage IV ([place])   . COPD (chronic obstructive pulmonary disease) ([place])   . Deafness in left ear   . Degenerative disc disease, lumbar   . Dilated cardiomyopathy ([place]) 10/07/2015  . Emphysema   . Esophageal stricture 07/02/1998   EGD  . GERD (gastroesophageal reflux disease)   . History of gout    "last flareup was in 2007" (12/13/2014)  . History of hiatal hernia   . Hyperlipidemia   . Hypertension   . Ischemic cardiomyopathy 2006   EF 40% to 50% by 2D echo in 2006;  Echo 12/31/12:  Mild LVH, EF 50-55%, normal wall motion.   [name] PVC's (premature ventricular contractions)   . Renal artery stenosis ([place])    a. noted on CT 2008.  . Type II diabetes mellitus ([place])    Diet control   . Walking pneumonia 1990's    Social History   Socioeconomic History  . Marital status: Married    Spouse name: Not on file  . Number of children: 2  . Years of education: Not on file  . Highest education level: 8th grade  Occupational History  . Occupation: Retired  Scientific laboratory technician  . Financial resource strain: Not hard at all  . Food insecurity:    Worry: Never true    Inability: Never true  . Transportation needs:    Medical: No    Non-medical: No  Tobacco Use  . Smoking status: Former Smoker    Packs/day: 3.00    Years: 20.00    Pack years: 60.00    Types: Cigarettes    Last attempt to quit: 07/25/1986    Years since quitting: 31.9  . Smokeless tobacco: Never Used  Substance and Sexual Activity  . Alcohol use: No    Alcohol/week: 0.0 standard drinks  . Drug use: No  . Sexual activity: Not Currently  Lifestyle  . Physical activity:    Days per week: 0 days    Minutes per session: 0 min  . Stress: Not at all  Relationships  . Social connections:    Talks on phone: Patient refused    Gets together: Patient refused    Attends religious service: Patient refused    Active member of club or organization: Patient refused    Attends meetings of clubs or organizations: Patient refused    Relationship status: Patient refused  . Intimate partner violence:    Fear of current or ex partner: Patient refused    Emotionally abused: Patient refused    Physically abused: Patient refused    Forced sexual activity: Patient refused  Other Topics Concern  . Not on file  Social History Narrative   Did auto salvage work.   Lives at home with his wife.  Independent at baseline.    Outpatient Encounter Medications as of 06/22/2018  Medication Sig  . allopurinol (ZYLOPRIM) 100 MG  tablet TAKE 1 TABLET BY MOUTH AT  BEDTIME  . amiodarone (PACERONE) 400 MG tablet Take 1 tablet (400 mg total) by mouth 2 (two) times daily.  [name] atorvastatin (LIPITOR) 40 MG tablet Take 1 tablet (40 mg total) by mouth daily.  . furosemide (LASIX) 40 MG tablet Take 40 mg by mouth daily as needed for fluid or edema.   . isosorbide mononitrate (IMDUR) 30 MG 24 hr tablet Take 1 tablet (30 mg total) by mouth daily.  [name] loratadine (CLARITIN) 10 MG tablet Take 10 mg by mouth daily as needed for allergies.  . magnesium oxide (MAG-OX)  400 MG tablet TAKE 1 TABLET BY MOUTH TWICE A DAY  . metoprolol tartrate (LOPRESSOR) 100 MG tablet Take 1 tablet (100 mg total) by mouth 2 (two) times daily.  . Multiple Vitamin (MULTIVITAMIN) tablet Take 1 tablet by mouth every morning.   . nitroGLYCERIN (NITROSTAT) 0.4 MG SL tablet Place 0.4 mg under the tongue every 5 (five) minutes as needed for chest pain (Up to 3 times).  . pantoprazole (PROTONIX) 40 MG tablet TAKE 1 TABLET BY MOUTH  DAILY  . ranitidine (ZANTAC) 150 MG tablet TAKE 1 TABLET BY MOUTH AT  BEDTIME  . Rivaroxaban (XARELTO) 15 MG TABS tablet Take 1 tablet (15 mg total) by mouth daily with supper.  . sodium bicarbonate 650 MG tablet Take 650 mg by mouth 2 (two) times daily.   No facility-administered encounter medications on file as of 06/22/2018.     Allergies  Allergen Reactions  . Predicort [Prednisolone] Other (See Comments)    Stomach pain  . Ciprofloxacin     GI upset  . Hydrochlorothiazide Other (See Comments)    Dehydration  . Hydrocodone     Stomach upset  . Hydrocodone-Acetaminophen     Stomach upset  . Hydrocodone-Acetaminophen Nausea Only  . Loratadine Other (See Comments)    Other reaction(s): Unknown  . Sulfa Antibiotics Other (See Comments)    Cannot recall  . Sulfacetamide Sodium Other (See Comments)    Cannot recall  . Sulfasalazine     Other reaction(s): Other (See Comments) Cannot recall  . Hydrocodone-Acetaminophen Nausea  Only  . Penicillins Hives and Rash    Has patient had a PCN reaction causing immediate rash, facial/tongue/throat swelling, SOB or lightheadedness with hypotension: YES Has patient had a PCN reaction causing severe rash involving mucus membranes or skin necrosis: NO Has patient had a PCN reaction that required hospitalization NO Has patient had a PCN reaction occurring within the last 10 years:NO If all of the above answers are "NO", then may proceed with Cephalosporin use.    Review of Systems  Constitutional: Positive for malaise/fatigue. Negative for fever.  Eyes: Negative.   Respiratory: Positive for cough, hemoptysis (probably from sinus drainage not hemoptysis), sputum production, shortness of breath and wheezing.   Cardiovascular: Positive for orthopnea (sleeps in the recliner). Negative for chest pain, palpitations, claudication and leg swelling.  Gastrointestinal: Negative.   Neurological: Negative.   Endo/Heme/Allergies: Negative.   Psychiatric/Behavioral: The patient has insomnia.     Objective:  BP (!) 174/83 (BP Location: Right Arm, Patient Position: Sitting, Cuff Size: Large)   Pulse 61   Temp 98.7 F (37.1 C) (Oral)   Wt 238 lb (108 kg)   SpO2 97%   BMI 34.15 kg/m   Physical Exam  Constitutional: He is oriented to person, place, and time and well-developed, well-nourished, and in no distress.  Trunkal obesity.  HENT:  Head: Normocephalic and atraumatic.  Eyes: Conjunctivae are normal. No scleral icterus.  Neck: No thyromegaly present.  Cardiovascular: Normal rate, regular rhythm and normal heart sounds.  Pulmonary/Chest: Effort normal and breath sounds normal.  Abdominal: Soft.  Neurological: He is alert and oriented to person, place, and time. Gait normal. GCS score is 15.  Skin: Skin is warm and dry.  Psychiatric: Mood, memory, affect and judgment normal.    Assessment and Plan :  1. Atrial fibrillation, unspecified type ([place] Rate controlled.On  Xarelto.  2. Essential hypertension Add Amlodipine 5mg  daily. 3. Dilated cardiomyopathy ([employer])   4. Coronary artery disease involving native  coronary artery of native heart with unstable angina pectoris ([place])   5. Type 2 diabetes mellitus without complication, without long-term current use of insulin ([place])   6. CKD (chronic kidney disease), stage IV ([place])

## 2018-06-22 NOTE — Telephone Encounter (Signed)
Mr. Korn wife called to let us know that pts PCP actually put him back on Amlodipine today at his o/v. Roderic Palau, NP, has been made aware.

## 2018-06-22 NOTE — Telephone Encounter (Signed)
This encounter was created in error - please disregard.

## 2018-07-03 DIAGNOSIS — E872 Acidosis: Secondary | ICD-10-CM | POA: Diagnosis not present

## 2018-07-03 DIAGNOSIS — R600 Localized edema: Secondary | ICD-10-CM | POA: Diagnosis not present

## 2018-07-03 DIAGNOSIS — I10 Essential (primary) hypertension: Secondary | ICD-10-CM | POA: Diagnosis not present

## 2018-07-03 DIAGNOSIS — N2581 Secondary hyperparathyroidism of renal origin: Secondary | ICD-10-CM | POA: Diagnosis not present

## 2018-07-03 DIAGNOSIS — N183 Chronic kidney disease, stage 3 (moderate): Secondary | ICD-10-CM | POA: Diagnosis not present

## 2018-07-03 NOTE — Discharge Summary (Signed)
[facility] NAME: Ronald [name]    MR#:  [id number]  DATE OF BIRTH:  05-Dec-1945  DATE OF ADMISSION:  06/10/2018 ADMITTING PHYSICIAN: Gladstone Lighter, MD  DATE OF DISCHARGE: 06/16/2018  2:25 PM  PRIMARY CARE PHYSICIAN: [name]., MD   ADMISSION DIAGNOSIS:  Unstable angina ([place]) [I20.0] Atrial fibrillation, unspecified type ([place]) [I48.91] Chest pain, unspecified type [R07.9]  DISCHARGE DIAGNOSIS:  Active Problems:   A-fib ([place])   SECONDARY DIAGNOSIS:   Past Medical History:  Diagnosis Date  . AAA (abdominal aortic aneurysm) ([place])    a. 3cm by [place] 2015.  [name] Arthritis    "hips; back" (12/13/2014)  . CAD (coronary artery disease) 2007   a. s/p CABG- IMA-LAD, VG-Cx, VG-RCA, VG-diag in 1999. B. sp redo CABG- VG-OM, VG-RCA in 2007 due to VG disease. c. NSTEMI 11/2014 s/p DES to SVG-OM from the Y graft.d. PTCA/DES x 1 distal body of SVG to Diagonal.09/2015  . Chronic combined systolic and diastolic CHF (congestive heart failure) ([place])    a. remote EF 40-45% in 2006. b. Normal EF 2014. b. Echo 07/2016 EF 45-50%, grade 1 DD.  [name] Chronic lower back pain   . CKD (chronic kidney disease), stage IV ([place])   . COPD (chronic obstructive pulmonary disease) ([place])   . Deafness in left ear   . Degenerative disc disease, lumbar   . Dilated cardiomyopathy ([place]) 10/07/2015  . Emphysema   . Esophageal stricture 07/02/1998   EGD  . GERD (gastroesophageal reflux disease)   . History of gout    "last flareup was in 2007" (12/13/2014)  . History of hiatal hernia   . Hyperlipidemia   . Hypertension   . Ischemic cardiomyopathy 2006   EF 40% to 50% by 2D echo in 2006;  Echo 12/31/12: Mild LVH, EF 50-55%, normal wall motion.   [name] PVC's (premature ventricular contractions)   . Renal artery stenosis ([place])    a. noted on CT 2008.  . Type II diabetes mellitus ([place])    Diet control   . Walking pneumonia 1990's     ADMITTING HISTORY  HISTORY OF  PRESENT ILLNESS:  Ronald [name]  is a 73 y.o. male with a known history of CAD status post CABG in 1999 and 2007, stents placed in 2017, [name], history of ischemic cardiomyopathy, [name], [name] Patient states he was doing well up until a month ago, and started having extremely low energy levels and fatigue.  Has chronic shortness of breath not on home oxygen.  Has been having intermittent chest pressures with minimal exertion that relieved with rest.  However when he woke up this morning, he felt sick and tired.  He sat down and started feeling significant palpitations associated with nausea, diaphoresis and chest pressure.  He says these symptoms were completely different from what he has experienced before.  He had a home blood pressure monitor that showed elevated blood pressure and heart rate in the 140s.  No previous diagnosis of atrial fibrillation or arrhythmias though he was noted to have skipped beats and PVCs in the past.  He came to the hospital by EMS.  EKG showing atrial fibrillation, first troponin is negative.  Received oral Cardizem and heart rate is better and symptomatically he feels much improved.   HOSPITAL COURSE:  [name], stents placed in 2017, [name], history of ischemic cardiomyopathy, [name], [name]  * New onset A. Fib/A Flutter Started on amiodarone drip.  Frequent episodes of atrial fibrillation.  Was transferred to ICU from telemetry floor due to this problem.  Discussed with Dr. Rockey Situ of cardiology.  His amiodarone rate was increased in the ICU.   Later transition to oral amiodarone after patient converted to normal sinus rhythm.  Moved out of ICU to telemetry and monitored overnight. On Xarelto Metoprolol added Discharge home while in normal sinus rhythm with amiodarone and metoprolol and Xarelto  * CAD-with stable angina symptoms -Cardiology consulted and appreciate input.  No further work-up needed at this time. -Most recent cardiac catheterization was from February 2018  * Hypertension-on metoprolol, Imdur, norvasc  *   [name].  Stable  * GERD-Protonix  * DVT prophylaxis Was on heparin drip on admission and later changed to Xarelto for his A. fib.  Patient discharged home in stable condition to follow-up with cardiology and primary care physician.  CONSULTS OBTAINED:  Treatment Team:  Thompson Grayer, MD  DRUG ALLERGIES:   Allergies  Allergen Reactions  . Predicort [Prednisolone] Other (See Comments)    Stomach pain  . Ciprofloxacin     GI upset  . Hydrochlorothiazide Other (See Comments)    Dehydration  . Hydrocodone     Stomach upset  . Hydrocodone-Acetaminophen     Stomach upset  . Hydrocodone-Acetaminophen Nausea Only  . Loratadine Other (See Comments)    Other reaction(s): Unknown  . Sulfa Antibiotics Other (See Comments)    Cannot recall  . Sulfacetamide Sodium Other (See Comments)    Cannot recall  . Sulfasalazine     Other reaction(s): Other (See Comments) Cannot recall  . Hydrocodone-Acetaminophen Nausea Only  . Penicillins Hives and Rash    Has patient had a PCN reaction causing immediate rash, facial/tongue/throat swelling, SOB or lightheadedness with hypotension: YES Has patient had a PCN reaction causing severe rash involving mucus membranes or skin necrosis: NO Has patient had a PCN reaction that required hospitalization NO Has patient had a PCN reaction occurring within the last 10 years:NO If all of the above answers are "NO", then may proceed with Cephalosporin use.     DISCHARGE MEDICATIONS:   Allergies as of 06/16/2018      Reactions   Predicort [prednisolone] Other (See Comments)   Stomach pain   Ciprofloxacin    GI upset   Hydrochlorothiazide Other (See Comments)   Dehydration   Hydrocodone    Stomach upset   Hydrocodone-acetaminophen    Stomach upset   Hydrocodone-acetaminophen Nausea Only   Loratadine Other (See Comments)   Other reaction(s): Unknown   Sulfa Antibiotics Other (See Comments)   Cannot recall   Sulfacetamide Sodium Other (See Comments)   Cannot recall   Sulfasalazine    Other reaction(s): Other (See Comments) Cannot recall   Hydrocodone-acetaminophen Nausea Only   Penicillins Hives, Rash   Has patient had a PCN reaction causing immediate rash, facial/tongue/throat swelling, SOB or lightheadedness with hypotension: YES Has patient had a PCN reaction causing severe rash involving mucus membranes or skin necrosis: NO Has patient had a PCN reaction that required hospitalization NO Has patient had a PCN reaction occurring  within the last 10 years:NO If all of the above answers are "NO", then may proceed with Cephalosporin use.      Medication List    STOP taking these medications   amLODipine 5 MG tablet Commonly known as:  NORVASC   aspirin 81 MG tablet   carvedilol 25 MG tablet Commonly known as:  COREG   clopidogrel 75 MG tablet Commonly known as:  PLAVIX     TAKE these medications   allopurinol 100 MG tablet Commonly known as:  ZYLOPRIM TAKE 1 TABLET BY MOUTH AT  BEDTIME   amiodarone 400 MG tablet Commonly known as:  PACERONE Take 1 tablet (400 mg total) by mouth 2 (two) times daily.   atorvastatin 40 MG tablet Commonly known as:  LIPITOR Take 1 tablet (40 mg total) by mouth daily.   furosemide 40 MG tablet Commonly known as:  LASIX Take 40 mg by mouth daily as needed for fluid or edema.   isosorbide mononitrate 30 MG 24 hr tablet Commonly known as:  IMDUR Take 1 tablet (30 mg total) by mouth  daily.   loratadine 10 MG tablet Commonly known as:  CLARITIN Take 10 mg by mouth daily as needed for allergies.   magnesium oxide 400 MG tablet Commonly known as:  MAG-OX TAKE 1 TABLET BY MOUTH TWICE A DAY   metoprolol tartrate 100 MG tablet Commonly known as:  LOPRESSOR Take 1 tablet (100 mg total) by mouth 2 (two) times daily.   multivitamin tablet Take 1 tablet by mouth every morning.   NITROSTAT 0.4 MG SL tablet Generic drug:  nitroGLYCERIN Place 0.4 mg under the tongue every 5 (five) minutes as needed for chest pain (Up to 3 times).   pantoprazole 40 MG tablet Commonly known as:  PROTONIX TAKE 1 TABLET BY MOUTH  DAILY   ranitidine 150 MG tablet Commonly known as:  ZANTAC TAKE 1 TABLET BY MOUTH AT  BEDTIME   Rivaroxaban 15 MG Tabs tablet Commonly known as:  XARELTO Take 1 tablet (15 mg total) by mouth daily with supper.   sodium bicarbonate 650 MG tablet Take 650 mg by mouth 2 (two) times daily.       Today   VITAL SIGNS:  Blood pressure 137/71, pulse 65, temperature 98.3 F (36.8 C), temperature source Oral, resp. rate 18, height 5\' 10"  (1.778 m), weight 107.2 kg, SpO2 94 %.  I/O:  No intake or output data in the 24 hours ending 07/03/18 1135  PHYSICAL EXAMINATION:  Physical Exam  GENERAL:  73 y.o.-year-old patient lying in the bed with no acute distress.  LUNGS: Normal breath sounds bilaterally, no wheezing, rales,rhonchi or crepitation. No use of accessory muscles of respiration.  CARDIOVASCULAR: S1, S2 normal. No murmurs, rubs, or gallops.  ABDOMEN: Soft, non-tender, non-distended. Bowel sounds present. No organomegaly or mass.  NEUROLOGIC: Moves all 4 extremities. PSYCHIATRIC: The patient is alert and oriented x 3.  SKIN: No obvious rash, lesion, or ulcer.   DATA REVIEW:   CBC No results for input(s): WBC, HGB, HCT, PLT in the last 168 hours.  Chemistries  No results for input(s): NA, K, CL, CO2, GLUCOSE, BUN, CREATININE, CALCIUM, MG, AST,  ALT, ALKPHOS, BILITOT in the last 168 hours.  Invalid input(s): GFRCGP  Cardiac Enzymes No results for input(s): TROPONINI in the last 168 hours.  Microbiology Results  Results for orders placed or performed during the hospital encounter of 06/10/18  MRSA PCR Screening     Status: None   Collection Time:  06/12/18  4:53 AM  Result Value Ref Range Status   MRSA by PCR NEGATIVE NEGATIVE Final    Comment:        The GeneXpert MRSA Assay (FDA approved for NASAL specimens only), is one component of a comprehensive MRSA colonization surveillance program. It is not intended to diagnose MRSA infection nor to guide or monitor treatment for MRSA infections. Performed at [facility], [address], [place], [place] [address]     RADIOLOGY:  No results found.  Follow up with PCP in 1 week.  Management plans discussed with the patient, family and they are in agreement.  CODE STATUS:  Code Status History    Date Active Date Inactive Code Status Order ID Comments User Context   06/11/2018 1234 06/16/2018 1733 DNR [id number]  Hillary Bow, MD Inpatient   06/10/2018 1552 06/11/2018 1234 Full Code [id number]  Gladstone Lighter, MD Inpatient   07/27/2017 0321 07/29/2017 1645 Full Code [id number]  Chriss Czar, MD ED   07/13/2016 1825 07/14/2016 1708 Full Code [id number]  Cheryln Manly, NP Inpatient   10/06/2015 1329 10/07/2015 1342 Full Code [id number]  Imogene Burn, PA-C Inpatient   12/13/2014 1735 12/14/2014 1442 Full Code [id number]  Jettie Booze, MD Inpatient   12/13/2014 0813 12/13/2014 1735 Full Code [id number]  Flossie Dibble, MD Inpatient   12/30/2012 1523 12/31/2012 1920 Full Code [id number]  Jacolyn Reedy, MD ED    Questions for Most Recent Historical Code Status (Order [id number])    Question Answer Comment   In the event of cardiac or respiratory ARREST Do not call a "code blue"    In the event of cardiac or respiratory ARREST Do not perform Intubation, CPR,  defibrillation or ACLS    In the event of cardiac or respiratory ARREST Use medication by any route, position, wound care, and other measures to relive pain and suffering. May use oxygen, suction and manual treatment of airway obstruction as needed for comfort.       TOTAL TIME TAKING CARE OF THIS PATIENT ON DAY OF DISCHARGE: more than 30 minutes.   Leia Alf Talmage Teaster M.D on 07/03/2018 at 11:35 AM  Between 7am to 6pm - Pager - [phone]  After 6pm go to [email or web address] - password [email or web address] [facility]  Office  [phone]  CC: Primary care physician; [name]., MD  Note: This dictation was prepared with Dragon dictation along with smaller phrase technology. Any transcriptional errors that result from this process are unintentional.

## 2018-07-07 DIAGNOSIS — N2581 Secondary hyperparathyroidism of renal origin: Secondary | ICD-10-CM | POA: Diagnosis not present

## 2018-07-07 DIAGNOSIS — N183 Chronic kidney disease, stage 3 (moderate): Secondary | ICD-10-CM | POA: Diagnosis not present

## 2018-07-07 DIAGNOSIS — I129 Hypertensive chronic kidney disease with stage 1 through stage 4 chronic kidney disease, or unspecified chronic kidney disease: Secondary | ICD-10-CM | POA: Diagnosis not present

## 2018-07-10 ENCOUNTER — Ambulatory Visit: Payer: Medicare Other | Admitting: Cardiovascular Disease

## 2018-07-10 ENCOUNTER — Encounter: Payer: Self-pay | Admitting: Cardiovascular Disease

## 2018-07-10 VITALS — BP 124/62 | HR 62 | Ht 70.0 in | Wt 240.8 lb

## 2018-07-10 DIAGNOSIS — I480 Paroxysmal atrial fibrillation: Secondary | ICD-10-CM

## 2018-07-10 DIAGNOSIS — I2510 Atherosclerotic heart disease of native coronary artery without angina pectoris: Secondary | ICD-10-CM | POA: Diagnosis not present

## 2018-07-10 DIAGNOSIS — I10 Essential (primary) hypertension: Secondary | ICD-10-CM

## 2018-07-10 DIAGNOSIS — I5042 Chronic combined systolic (congestive) and diastolic (congestive) heart failure: Secondary | ICD-10-CM | POA: Diagnosis not present

## 2018-07-10 DIAGNOSIS — E7800 Pure hypercholesterolemia, unspecified: Secondary | ICD-10-CM

## 2018-07-10 DIAGNOSIS — I255 Ischemic cardiomyopathy: Secondary | ICD-10-CM | POA: Diagnosis not present

## 2018-07-10 MED ORDER — RIVAROXABAN 15 MG PO TABS: 15.0000 mg | ORAL_TABLET | Freq: Every day | ORAL | 11 refills | Status: DC

## 2018-07-10 MED ORDER — ASPIRIN EC 81 MG PO TBEC: 81.0000 mg | DELAYED_RELEASE_TABLET | Freq: Every day | ORAL | 3 refills | Status: DC

## 2018-07-10 MED ORDER — METOPROLOL TARTRATE 100 MG PO TABS: 100.0000 mg | ORAL_TABLET | Freq: Two times a day (BID) | ORAL | 3 refills | Status: DC

## 2018-07-10 NOTE — Progress Notes (Signed)
Chief Complaint  Patient presents with  . Follow-up    atrial fibrillation    History of Present Illness: 73 y.o. male with history of CAD s/p CABG in 1996 (LIMA-LAD, SVG-circumflex, SVG-RCA, SVG-diagonal) with redo bypass in 2007 (SVG-OM, SVG-RCA), ischemic cardiomyopathy, CKD, DM 2, HTN, HLD, renal artery stenosis, COPD, GERD and PVCs here today for cardiac follow up. Echo in 2014 with normal LV systolic function. He was hospitalized at [facility] July 2016 with a NSTEMI. Cardiac cath with severe disease in the SVG to OM treated with a drug eluting stent. He was treated with Brilinta post PCI but developed dyspnea and was changed to Plavix. He was admitted to [facility] May 2017 with unstable angina and cardiac cath showed severe disease in the distal body/anastomosis of the SVG to Diagonal.  A drug eluting stent was placed in this vein graft. The LIMA to LAD was patent, SVG to OM was patent, SVG to PDA/PLA was patent. He was admitted to [facility] February 2018 with chest pain. Troponin was negative x 3. He did not have an ischemic evaluation. Echo February 2018 with  mild LV systolic dysfunction. LVEF=45-50%. I saw him in the office in January 2019 and he was volume overloaded. Lasix was started. He was admitted to [facility] February 2019 with unstable angina and cardiac cath showed patency of all of the bypass grafts. Echo February 2019 with normal LV systolic function, YOVZ=85-88%, grade 1 diastolic dysfunction. He was admitted to [facility] January 2020 with atrial fib/flutter with RVR. Started on Xarelto and initially rate controlled with IV Cardizem. He was loaded with IV amiodarone and discharged home on po amiodarone and Lopressor (Coreg stopped during admission). His ASA, Plavix and Norvasc were stopped at discharge. His Norvasc was restarted in primary care.   He is here today for follow up. The patient denies any chest pain, dyspnea, palpitations, lower extremity edema, orthopnea, PND, dizziness, near syncope or  syncope. He is feeling great.   Primary Care Physician: [name]., MD   Past Medical History:  Diagnosis Date  . AAA (abdominal aortic aneurysm) ([place])    a. 3cm by [place] 2015.  [name] Arthritis    "hips; back" (12/13/2014)  . CAD (coronary artery disease) 2007   a. s/p CABG- IMA-LAD, VG-Cx, VG-RCA, VG-diag in 1999. B. sp redo CABG- VG-OM, VG-RCA in 2007 due to VG disease. c. NSTEMI 11/2014 s/p DES to SVG-OM from the Y graft.d. PTCA/DES x 1 distal body of SVG to Diagonal.09/2015  . Chronic combined systolic and diastolic CHF (congestive heart failure) ([place])    a. remote EF 40-45% in 2006. b. Normal EF 2014. b. Echo 07/2016 EF 45-50%, grade 1 DD.  [name] Chronic lower back pain   . CKD (chronic kidney disease), stage IV ([place])   . COPD (chronic obstructive pulmonary disease) ([place])   . Deafness in left ear   . Degenerative disc disease, lumbar   . Dilated cardiomyopathy ([place]) 10/07/2015  . Emphysema   . Esophageal stricture 07/02/1998   EGD  . GERD (gastroesophageal reflux disease)   . History of gout    "last flareup was in 2007" (12/13/2014)  . History of hiatal hernia   . Hyperlipidemia   . Hypertension   . Ischemic cardiomyopathy 2006   EF 40% to 50% by 2D echo in 2006;  Echo 12/31/12: Mild LVH, EF 50-55%, normal wall motion.   [name] PVC's (premature ventricular contractions)   . Renal artery stenosis ([facility])    a. noted  on CT 2008.  . Type II diabetes mellitus ([place])    Diet control   . Walking pneumonia 1990's    Past Surgical History:  Procedure Laterality Date  . CARDIAC CATHETERIZATION  "several"  . CARDIAC CATHETERIZATION N/A 12/13/2014   Procedure: Left Heart Cath and Coronary Angiography;  Surgeon: Jettie Booze, MD;  Location: [place] [facility];  Service: Cardiovascular;  Laterality: N/A;  . CARDIAC CATHETERIZATION  12/13/2014   Procedure: Coronary Stent Intervention;  Surgeon: Jettie Booze, MD;  Location: [place] [facility];  Service: Cardiovascular;;  . CARDIAC  CATHETERIZATION N/A 10/07/2015   Procedure: Left Heart Cath and Cors/Grafts Angiography;  Surgeon: Burnell Blanks, MD;  Location: [place] [facility];  Service: Cardiovascular;  Laterality: N/A;  . CARDIAC CATHETERIZATION N/A 10/07/2015   Procedure: Coronary Stent Intervention;  Surgeon: Burnell Blanks, MD;  Location: [place] [facility];  Service: Cardiovascular;  Laterality: N/A;  . CORONARY ANGIOPLASTY  "several"  . CORONARY ANGIOPLASTY WITH STENT PLACEMENT  2005; 12/13/2014   "2; 1"  . CORONARY ARTERY BYPASS GRAFT  1996   CABG X5  . CORONARY ARTERY BYPASS GRAFT  March 2007   CABG X3  . ESOPHAGOGASTRODUODENOSCOPY (EGD) WITH ESOPHAGEAL DILATION  2000  . GREEN LIGHT LASER TURP (TRANSURETHRAL RESECTION OF PROSTATE  2000's   "not cancerous"  . HERNIA REPAIR    . LAPAROSCOPIC CHOLECYSTECTOMY    . LEFT HEART CATH AND CORS/GRAFTS ANGIOGRAPHY N/A 07/28/2017   Procedure: LEFT HEART CATH AND CORS/GRAFTS ANGIOGRAPHY;  Surgeon: Troy Sine, MD;  Location: [place] [facility];  Service: Cardiovascular;  Laterality: N/A;  . LUNG SURGERY  1996   "S/P CABG, had to put staple in lung after it had collapsed"  . UMBILICAL HERNIA REPAIR     w/chole    Current Outpatient Medications  Medication Sig Dispense Refill  . allopurinol (ZYLOPRIM) 100 MG tablet TAKE 1 TABLET BY MOUTH AT  BEDTIME 90 tablet 3  . amiodarone (PACERONE) 200 MG tablet Take 200 mg by mouth daily.    [name] amLODipine (NORVASC) 5 MG tablet Take 5 mg by mouth 2 (two) times daily.     [name] atorvastatin (LIPITOR) 40 MG tablet Take 1 tablet (40 mg total) by mouth daily. 90 tablet 3  . furosemide (LASIX) 40 MG tablet Take 40 mg by mouth daily as needed for fluid or edema.   6  . isosorbide mononitrate (IMDUR) 30 MG 24 hr tablet Take 1 tablet (30 mg total) by mouth daily. 90 tablet 3  . loratadine (CLARITIN) 10 MG tablet Take 10 mg by mouth daily as needed for allergies.    . magnesium oxide (MAG-OX) 400 MG tablet TAKE 1 TABLET BY MOUTH  TWICE A DAY 180 tablet 3  . metoprolol tartrate (LOPRESSOR) 100 MG tablet Take 1 tablet (100 mg total) by mouth 2 (two) times daily. 180 tablet 3  . Multiple Vitamin (MULTIVITAMIN) tablet Take 1 tablet by mouth every morning.     . nitroGLYCERIN (NITROSTAT) 0.4 MG SL tablet Place 0.4 mg under the tongue every 5 (five) minutes as needed for chest pain (Up to 3 times).    . pantoprazole (PROTONIX) 40 MG tablet TAKE 1 TABLET BY MOUTH  DAILY 90 tablet 3  . ranitidine (ZANTAC) 150 MG tablet TAKE 1 TABLET BY MOUTH AT  BEDTIME 90 tablet 3  . Rivaroxaban (XARELTO) 15 MG TABS tablet Take 1 tablet (15 mg total) by mouth daily with supper. 30 tablet 11  .  sodium bicarbonate 650 MG tablet Take 650 mg by mouth 2 (two) times daily.    [name] aspirin EC 81 MG tablet Take 1 tablet (81 mg total) by mouth daily. 90 tablet 3   No current facility-administered medications for this visit.     Allergies  Allergen Reactions  . Predicort [Prednisolone] Other (See Comments)    Stomach pain  . Ciprofloxacin     GI upset  . Hydrochlorothiazide Other (See Comments)    Dehydration  . Hydrocodone     Stomach upset  . Hydrocodone-Acetaminophen     Stomach upset  . Hydrocodone-Acetaminophen Nausea Only  . Loratadine Other (See Comments)    Other reaction(s): Unknown  . Sulfa Antibiotics Other (See Comments)    Cannot recall  . Sulfacetamide Sodium Other (See Comments)    Cannot recall  . Sulfasalazine     Other reaction(s): Other (See Comments) Cannot recall  . Hydrocodone-Acetaminophen Nausea Only  . Penicillins Hives and Rash    Has patient had a PCN reaction causing immediate rash, facial/tongue/throat swelling, SOB or lightheadedness with hypotension: YES Has patient had a PCN reaction causing severe rash involving mucus membranes or skin necrosis: NO Has patient had a PCN reaction that required hospitalization NO Has patient had a PCN reaction occurring within the last 10 years:NO If all of the above  answers are "NO", then may proceed with Cephalosporin use.    Social History   Socioeconomic History  . Marital status: Married    Spouse name: Not on file  . Number of children: 2  . Years of education: Not on file  . Highest education level: 8th grade  Occupational History  . Occupation: Retired  Scientific laboratory technician  . Financial resource strain: Not hard at all  . Food insecurity:    Worry: Never true    Inability: Never true  . Transportation needs:    Medical: No    Non-medical: No  Tobacco Use  . Smoking status: Former Smoker    Packs/day: 3.00    Years: 20.00    Pack years: 60.00    Types: Cigarettes    Last attempt to quit: 07/25/1986    Years since quitting: 31.9  . Smokeless tobacco: Never Used  Substance and Sexual Activity  . Alcohol use: No    Alcohol/week: 0.0 standard drinks  . Drug use: No  . Sexual activity: Not Currently  Lifestyle  . Physical activity:    Days per week: 0 days    Minutes per session: 0 min  . Stress: Not at all  Relationships  . Social connections:    Talks on phone: Patient refused    Gets together: Patient refused    Attends religious service: Patient refused    Active member of club or organization: Patient refused    Attends meetings of clubs or organizations: Patient refused    Relationship status: Patient refused  . Intimate partner violence:    Fear of current or ex partner: Patient refused    Emotionally abused: Patient refused    Physically abused: Patient refused    Forced sexual activity: Patient refused  Other Topics Concern  . Not on file  Social History Narrative   Did auto salvage work.   Lives at home with his wife.  Independent at baseline.    Family History  Problem Relation Age of Onset  . Heart attack Mother        MI  . Stroke Mother   . Heart disease Mother   .  Hypertension Mother   . Hyperlipidemia Mother   . Heart disease Father   . Rheumatic fever Father   . Colon cancer Neg Hx     Review of  Systems:  As stated in the HPI and otherwise negative.   BP 124/62   Pulse 62   Ht 5\' 10"  (1.778 m)   Wt 240 lb 12.8 oz (109.2 kg)   SpO2 98%   BMI 34.55 kg/m   Physical Examination:  General: Well developed, well nourished, NAD  HEENT: OP clear, mucus membranes moist  SKIN: warm, dry. No rashes. Neuro: No focal deficits  Musculoskeletal: Muscle strength 5/5 all ext  Psychiatric: Mood and affect normal  Neck: No JVD, no carotid bruits, no thyromegaly, no lymphadenopathy.  Lungs:Clear bilaterally, no wheezes, rhonci, crackles Cardiovascular: Regular rate and rhythm. No murmurs, gallops or rubs. Abdomen:Soft. Bowel sounds present. Non-tender.  Extremities: No lower extremity edema. Pulses are 2 + in the bilateral DP/PT.  Echo 07/14/17: - Left ventricle: The cavity size was mildly dilated. Systolic   function was normal. The estimated ejection fraction was in the   range of 55% to 60%. Doppler parameters are consistent with   abnormal left ventricular relaxation (grade 1 diastolic   dysfunction).  Cardiac cath February 2019: Significant native CAD with total occlusion of the LAD after the takeoff of the first diagonal vessel; total occlusion of the proximal left circumflex coronary artery; and total occlusion of the proximal RCA with antegrade bridging collaterals. Patent LIMA to LAD. Patent Y vein graft from the 1996 surgery which supplies the diagonal vessel and distal circumflex marginal vessel.  There is diffuse narrowing of 50% in the midportion of the Y graft supplying the diagonal vessel and a patent distal stent extending to the ostium of the diagonal.  The Y graft supplying the distal marginal is free of significant disease and has mild luminal irregularity. Patent SVG supplying the distal RCA from the 2007 surgery with a stent in the proximal portion of the graft with 30% narrowings in the proximal and mid segment. LVEDP 18 mm  Diagnostic Diagram        EKG:  EKG  is not ordered today. The ekg ordered today demonstrates  Recent Labs: 06/11/2018: TSH 2.491 06/12/2018: ALT 20; Magnesium 2.6 06/14/2018: BUN 35; Creatinine, Ser 2.52; Hemoglobin 12.4; Platelets 136; Potassium 4.3; Sodium 140   Lipid Panel    Component Value Date/Time   CHOL 115 11/22/2017 0931   TRIG 68 11/22/2017 0931   HDL 45 11/22/2017 0931   CHOLHDL 4.5 12/13/2014 0423   VLDL 25 12/13/2014 0423   LDLCALC 56 11/22/2017 0931     Wt Readings from Last 3 Encounters:  07/10/18 240 lb 12.8 oz (109.2 kg)  06/22/18 238 lb (108 kg)  06/20/18 239 lb (108.4 kg)     Other studies Reviewed: Additional studies/ records that were reviewed today include: . Review of the above records demonstrates:    Assessment and Plan:   1. CAD s/p CABG with stable angina: Cardiac cath February 2019 with 4 patent vein grafts. Echo February 2019 with normal LV function. He has no chest pain. Will restart ASA. Continue beta blocker, statin and Imdur.      2. Ischemic Cardiomyopathy: LVEF =35% by echo at [facility] during his admission with atrial fib. LVEF had been 45% last year. Overall feeling well. No Ace-inh or ARB due to CKD. Will continue beta blocker.    3. Hypertension: BP is well controlled. He is back  on Norvasc.   4. Hyperlipidemia: Lipids followed in primary care. Will continue statin  5. CKD: Followed by Nephrology. Ace-inh stopped due to renal insufficiency.   6. Chronic systolic CHF: Volume status is ok today. Continue Lasix daily.   7. PVCs/Bigeminy: no palpitations. Continue beta blocker.    8. AAA: stable by u/s February 2019.   9. Atrial fibrillation, paroxysmal: Sinus today. He is now on amiodarone 200 mg daily. CHADS VASC score 5. Will continue Lopressor, amiodarone and Xarelto.     Current medicines are reviewed at length with the patient today.  The patient does not have concerns regarding medicines.  The following changes have been made:  no change  Labs/ tests ordered  today include:   No orders of the defined types were placed in this encounter.   Disposition:   FU with me in 6 months.    Signed, Lauree Chandler, MD 07/10/2018 4:20 PM    [facility] [place], [place], [address]  [address] Phone: [phone]; Fax: [phone]

## 2018-07-10 NOTE — Patient Instructions (Signed)
Medication Instructions:  Your physician has recommended you make the following change in your medication: Start aspirin 81 mg by mouth daily  If you need a refill on your cardiac medications before your next appointment, please call your pharmacy.   Lab work: none If you have labs (blood work) drawn today and your tests are completely normal, you will receive your results only by: [name] MyChart Message (if you have MyChart) OR . A paper copy in the mail If you have any lab test that is abnormal or we need to change your treatment, we will call you to review the results.  Testing/Procedures: none  Follow-Up: At [facility], you and your health needs are our priority.  As part of our continuing mission to provide you with exceptional heart care, we have created designated Provider Care Teams.  These Care Teams include your primary Cardiologist (physician) and Advanced Practice Providers (APPs -  Physician Assistants and Nurse Practitioners) who all work together to provide you with the care you need, when you need it. You will need a follow up appointment in 6 months.  Please call our office 3 months in advance to schedule this appointment.  You may see Lauree Chandler, MD or one of the following Advanced Practice Providers on your designated Care Team:   [employer], PA-C Melina Copa, PA-C . Ermalinda Barrios, PA-C  Any Other Special Instructions Will Be Listed Below (If Applicable).

## 2018-07-17 ENCOUNTER — Telehealth: Payer: Self-pay | Admitting: Family Medicine

## 2018-07-17 DIAGNOSIS — N401 Enlarged prostate with lower urinary tract symptoms: Principal | ICD-10-CM

## 2018-07-17 DIAGNOSIS — J329 Chronic sinusitis, unspecified: Secondary | ICD-10-CM

## 2018-07-17 DIAGNOSIS — N3941 Urge incontinence: Secondary | ICD-10-CM

## 2018-07-17 DIAGNOSIS — N138 Other obstructive and reflux uropathy: Secondary | ICD-10-CM

## 2018-07-17 NOTE — Telephone Encounter (Signed)
Ok to refer? Please advise. Thanks!  

## 2018-07-17 NOTE — Telephone Encounter (Signed)
Pt is needing a urologist referral to  [facility] at [phone]    Also needing a ENT referral for ears.  Thanks, [employer]

## 2018-07-18 ENCOUNTER — Ambulatory Visit: Payer: Medicare Other | Admitting: Physician Assistant

## 2018-07-18 NOTE — Telephone Encounter (Signed)
ok 

## 2018-07-18 NOTE — Telephone Encounter (Signed)
Orders placed for referral. Patient's wife was advised.

## 2018-07-19 ENCOUNTER — Other Ambulatory Visit: Payer: Self-pay | Admitting: Cardiovascular Disease

## 2018-08-01 DIAGNOSIS — G4733 Obstructive sleep apnea (adult) (pediatric): Secondary | ICD-10-CM | POA: Diagnosis not present

## 2018-08-01 DIAGNOSIS — J301 Allergic rhinitis due to pollen: Secondary | ICD-10-CM | POA: Diagnosis not present

## 2018-08-01 DIAGNOSIS — J342 Deviated nasal septum: Secondary | ICD-10-CM | POA: Diagnosis not present

## 2018-08-01 DIAGNOSIS — J310 Chronic rhinitis: Secondary | ICD-10-CM | POA: Diagnosis not present

## 2018-08-15 ENCOUNTER — Ambulatory Visit: Payer: Medicare Other | Admitting: Urology

## 2018-08-22 DIAGNOSIS — G4733 Obstructive sleep apnea (adult) (pediatric): Secondary | ICD-10-CM | POA: Diagnosis not present

## 2018-08-25 ENCOUNTER — Other Ambulatory Visit: Payer: Self-pay | Admitting: Family Medicine

## 2018-08-29 ENCOUNTER — Other Ambulatory Visit: Payer: Self-pay | Admitting: Cardiovascular Disease

## 2018-08-29 MED ORDER — METOPROLOL TARTRATE 100 MG PO TABS: 100.0000 mg | ORAL_TABLET | Freq: Two times a day (BID) | ORAL | 3 refills | Status: DC

## 2018-09-15 ENCOUNTER — Telehealth: Payer: Self-pay

## 2018-09-15 ENCOUNTER — Other Ambulatory Visit: Payer: Self-pay | Admitting: Family Medicine

## 2018-09-15 NOTE — Telephone Encounter (Signed)
Pt's wife is calling asking what pt can be switched to with zantac being taken off shelf.  I was going to send in protonix but pt is on 40 mg already.  Is there something extra that he needs to take or does he need to increase the dose if needed?

## 2018-09-15 NOTE — Progress Notes (Signed)
None

## 2018-09-15 NOTE — Telephone Encounter (Signed)
BID please.

## 2018-09-17 ENCOUNTER — Other Ambulatory Visit: Payer: Self-pay | Admitting: Family Medicine

## 2018-09-18 ENCOUNTER — Other Ambulatory Visit: Payer: Self-pay | Admitting: Family Medicine

## 2018-09-18 NOTE — Telephone Encounter (Signed)
Patient's wife [name] was advised. patient has some 40 mg tablets left. [name] would like for [place] to send in new rx with correct dose and directions. [facility] please advise?

## 2018-09-18 NOTE — Telephone Encounter (Signed)
Ok to send in BID protonix.

## 2018-09-18 NOTE — Telephone Encounter (Signed)
Left message to call back  

## 2018-09-19 MED ORDER — PANTOPRAZOLE SODIUM 40 MG PO TBEC: 80.0000 mg | DELAYED_RELEASE_TABLET | Freq: Every day | ORAL | 3 refills | Status: DC

## 2018-09-19 NOTE — Telephone Encounter (Signed)
Done

## 2018-10-02 ENCOUNTER — Other Ambulatory Visit (HOSPITAL_COMMUNITY): Payer: Self-pay | Admitting: Physician Assistant

## 2018-10-02 DIAGNOSIS — I714 Abdominal aortic aneurysm, without rupture, unspecified: Secondary | ICD-10-CM

## 2018-10-03 DIAGNOSIS — N184 Chronic kidney disease, stage 4 (severe): Secondary | ICD-10-CM | POA: Diagnosis not present

## 2018-10-03 DIAGNOSIS — I129 Hypertensive chronic kidney disease with stage 1 through stage 4 chronic kidney disease, or unspecified chronic kidney disease: Secondary | ICD-10-CM | POA: Diagnosis not present

## 2018-10-03 DIAGNOSIS — R600 Localized edema: Secondary | ICD-10-CM | POA: Diagnosis not present

## 2018-10-03 DIAGNOSIS — N2581 Secondary hyperparathyroidism of renal origin: Secondary | ICD-10-CM | POA: Diagnosis not present

## 2018-10-11 DIAGNOSIS — G4733 Obstructive sleep apnea (adult) (pediatric): Secondary | ICD-10-CM | POA: Diagnosis not present

## 2018-10-16 ENCOUNTER — Ambulatory Visit: Payer: Self-pay | Admitting: Family Medicine

## 2018-10-26 ENCOUNTER — Ambulatory Visit: Payer: Medicare Other | Admitting: Urology

## 2018-10-30 ENCOUNTER — Encounter: Payer: Self-pay | Admitting: Family Medicine

## 2018-10-30 ENCOUNTER — Ambulatory Visit (INDEPENDENT_AMBULATORY_CARE_PROVIDER_SITE_OTHER): Payer: Medicare Other | Admitting: Family Medicine

## 2018-10-30 ENCOUNTER — Other Ambulatory Visit: Payer: Self-pay

## 2018-10-30 VITALS — BP 128/78 | HR 60 | Temp 98.8°F | Resp 16 | Wt 249.0 lb

## 2018-10-30 DIAGNOSIS — R5383 Other fatigue: Secondary | ICD-10-CM | POA: Diagnosis not present

## 2018-10-30 DIAGNOSIS — I25110 Atherosclerotic heart disease of native coronary artery with unstable angina pectoris: Secondary | ICD-10-CM

## 2018-10-30 DIAGNOSIS — N183 Chronic kidney disease, stage 3 unspecified: Secondary | ICD-10-CM

## 2018-10-30 DIAGNOSIS — G4733 Obstructive sleep apnea (adult) (pediatric): Secondary | ICD-10-CM

## 2018-10-30 DIAGNOSIS — N138 Other obstructive and reflux uropathy: Secondary | ICD-10-CM

## 2018-10-30 DIAGNOSIS — E1121 Type 2 diabetes mellitus with diabetic nephropathy: Secondary | ICD-10-CM | POA: Diagnosis not present

## 2018-10-30 DIAGNOSIS — N401 Enlarged prostate with lower urinary tract symptoms: Secondary | ICD-10-CM

## 2018-10-30 NOTE — Progress Notes (Signed)
Patient: [name] Male    DOB: Mar 28, 1946   73 y.o.   MRN: [id number] Visit Date: 10/30/2018  Today's Provider: [name], MD   Chief Complaint  Patient presents with  . Hypertension   Subjective:     HPI   Hypertension, follow-up:  BP Readings from Last 3 Encounters:  10/30/18 128/78  07/10/18 124/62  06/22/18 (!) 174/83    He was last seen for hypertension 5 months ago.  BP at that visit was 174/86. Management since that visit includes adding amlodipine 5mg  BID. He reports good compliance with treatment. He is not having side effects.  He is not exercising. He is adherent to low salt diet.   Outside blood pressures are checked occasionally. He is experiencing none.  Patient denies exertional chest pressure/discomfort, lower extremity edema and palpitations.    Weight trend: stable Wt Readings from Last 3 Encounters:  10/30/18 249 lb (112.9 kg)  07/10/18 240 lb 12.8 oz (109.2 kg)  06/22/18 238 lb (108 kg)    Current diet: well balanced  Sinus issues: Patient reports that he has had sinus troubles since the fall.  He is trying CPAP now and says his mouth feels dry since he has had this.  He also states that he has a bad taste in his mouth. Allergies  Allergen Reactions  . Predicort [Prednisolone] Other (See Comments)    Stomach pain  . Ciprofloxacin     GI upset  . Hydrochlorothiazide Other (See Comments)    Dehydration  . Hydrocodone     Stomach upset  . Hydrocodone-Acetaminophen     Stomach upset  . Hydrocodone-Acetaminophen Nausea Only  . Loratadine Other (See Comments)    Other reaction(s): Unknown  . Sulfa Antibiotics Other (See Comments)    Cannot recall  . Sulfacetamide Sodium Other (See Comments)    Cannot recall  . Sulfasalazine     Other reaction(s): Other (See Comments) Cannot recall  . Hydrocodone-Acetaminophen Nausea Only  . Penicillins Hives and Rash    Has patient had a PCN reaction causing immediate rash,  facial/tongue/throat swelling, SOB or lightheadedness with hypotension: YES Has patient had a PCN reaction causing severe rash involving mucus membranes or skin necrosis: NO Has patient had a PCN reaction that required hospitalization NO Has patient had a PCN reaction occurring within the last 10 years:NO If all of the above answers are "NO", then may proceed with Cephalosporin use.     Current Outpatient Medications:  .  allopurinol (ZYLOPRIM) 100 MG tablet, TAKE 1 TABLET BY MOUTH AT  BEDTIME, Disp: 90 tablet, Rfl: 3 .  amiodarone (PACERONE) 200 MG tablet, TAKE 2 TABLETS BY MOUTH TWICE A DAY, Disp: 360 tablet, Rfl: 1 .  amLODipine (NORVASC) 5 MG tablet, Take 5 mg by mouth 2 (two) times daily. , Disp: , Rfl:  .  aspirin EC 81 MG tablet, Take 1 tablet (81 mg total) by mouth daily., Disp: 90 tablet, Rfl: 3 .  atorvastatin (LIPITOR) 40 MG tablet, Take 1 tablet (40 mg total) by mouth daily., Disp: 90 tablet, Rfl: 3 .  furosemide (LASIX) 40 MG tablet, Take 40 mg by mouth daily as needed for fluid or edema. , Disp: , Rfl: 6 .  isosorbide mononitrate (IMDUR) 30 MG 24 hr tablet, TAKE 1 TABLET BY MOUTH EVERY DAY, Disp: 90 tablet, Rfl: 3 .  loratadine (CLARITIN) 10 MG tablet, Take 10 mg by mouth daily as needed for allergies., Disp: , Rfl:  .  magnesium oxide (MAG-OX) 400 MG tablet, TAKE 1 TABLET BY MOUTH TWICE A DAY, Disp: 180 tablet, Rfl: 3 .  metoprolol tartrate (LOPRESSOR) 100 MG tablet, Take 1 tablet (100 mg total) by mouth 2 (two) times daily., Disp: 180 tablet, Rfl: 3 .  Multiple Vitamin (MULTIVITAMIN) tablet, Take 1 tablet by mouth every morning. , Disp: , Rfl:  .  nitroGLYCERIN (NITROSTAT) 0.4 MG SL tablet, Place 0.4 mg under the tongue every 5 (five) minutes as needed for chest pain (Up to 3 times)., Disp: , Rfl:  .  pantoprazole (PROTONIX) 40 MG tablet, Take 2 tablets (80 mg total) by mouth daily., Disp: 180 tablet, Rfl: 3 .  ranitidine (ZANTAC) 150 MG tablet, TAKE 1 TABLET BY MOUTH AT   BEDTIME, Disp: 90 tablet, Rfl: 3 .  Rivaroxaban (XARELTO) 15 MG TABS tablet, Take 1 tablet (15 mg total) by mouth daily with supper., Disp: 30 tablet, Rfl: 11 .  sodium bicarbonate 650 MG tablet, Take 650 mg by mouth 2 (two) times daily., Disp: , Rfl:   Review of Systems  Constitutional: Positive for fatigue.  HENT: Positive for congestion and postnasal drip.   Eyes: Negative.   Respiratory: Negative.   Cardiovascular: Negative.   Gastrointestinal: Negative.   Endocrine: Negative.   Allergic/Immunologic: Negative.   Hematological: Negative.   Psychiatric/Behavioral: Negative.   All other systems reviewed and are negative.   Social History   Tobacco Use  . Smoking status: Former Smoker    Packs/day: 3.00    Years: 20.00    Pack years: 60.00    Types: Cigarettes    Last attempt to quit: 07/25/1986    Years since quitting: 32.2  . Smokeless tobacco: Never Used  Substance Use Topics  . Alcohol use: No    Alcohol/week: 0.0 standard drinks      Objective:   BP 128/78   Pulse 60   Temp 98.8 F (37.1 C)   Resp 16   Wt 249 lb (112.9 kg)   SpO2 98%   BMI 35.73 kg/m  Vitals:   10/30/18 1534  BP: 128/78  Pulse: 60  Resp: 16  Temp: 98.8 F (37.1 C)  SpO2: 98%  Weight: 249 lb (112.9 kg)     Physical Exam Vitals signs reviewed.  Constitutional:      Appearance: He is well-developed. He is obese.  HENT:     Head: Normocephalic and atraumatic.     Right Ear: External ear normal.     Left Ear: External ear normal.     Nose: Nose normal.  Eyes:     General: No scleral icterus.    Conjunctiva/sclera: Conjunctivae normal.  Neck:     Thyroid: No thyromegaly.  Cardiovascular:     Rate and Rhythm: Normal rate and regular rhythm.     Heart sounds: Normal heart sounds.  Pulmonary:     Effort: Pulmonary effort is normal.     Breath sounds: Normal breath sounds.  Abdominal:     Palpations: Abdomen is soft.     Comments: Trunk obesity  Musculoskeletal:     Right  lower leg: No edema.     Left lower leg: No edema.  Skin:    General: Skin is warm and dry.  Neurological:     Mental Status: He is alert and oriented to person, place, and time. Mental status is at baseline.  Psychiatric:        Mood and Affect: Mood normal.        Behavior:  Behavior normal.        Thought Content: Thought content normal.        Judgment: Judgment normal.         Assessment & Plan    1. CKD (chronic kidney disease) stage 3, GFR 30-59 ml/min (HCC) Followed by nephrology. - Comprehensive metabolic panel - Hemoglobin A1c  2. Diabetes mellitus with nephropathy ([place]) Controlled diabetes and hypertension as well as possible.RTC 4 months. - CBC with Differential/Platelet - Hemoglobin A1c  3. Coronary artery disease involving native coronary artery of native heart with unstable angina pectoris ([place]) All risk factors treated. - Comprehensive metabolic panel - CBC with Differential/Platelet  4. Fatigue, unspecified type  - Comprehensive metabolic panel - CBC with Differential/Platelet  5. Benign prostatic hyperplasia with urinary obstruction   6. OSA (obstructive sleep apnea) I think the patient's oral and postnasal complaints are due to CPAP.  Discussed possible ENT referral if this persists.  I do not think this is due to [place].  Offered COVID testing but patient declines.    I have done the exam and reviewed the above chart and it is accurate to the best of my knowledge. Development worker, community has been used in this note in any air is in the dictation or transcription are unintentional.  [name], MD  [place]

## 2018-10-31 DIAGNOSIS — E1121 Type 2 diabetes mellitus with diabetic nephropathy: Secondary | ICD-10-CM | POA: Diagnosis not present

## 2018-10-31 DIAGNOSIS — N183 Chronic kidney disease, stage 3 (moderate): Secondary | ICD-10-CM | POA: Diagnosis not present

## 2018-10-31 DIAGNOSIS — R5383 Other fatigue: Secondary | ICD-10-CM | POA: Diagnosis not present

## 2018-10-31 DIAGNOSIS — I25110 Atherosclerotic heart disease of native coronary artery with unstable angina pectoris: Secondary | ICD-10-CM | POA: Diagnosis not present

## 2018-11-01 ENCOUNTER — Other Ambulatory Visit: Payer: Self-pay | Admitting: Cardiovascular Disease

## 2018-11-01 ENCOUNTER — Other Ambulatory Visit: Payer: Self-pay | Admitting: Family Medicine

## 2018-11-01 LAB — COMPREHENSIVE METABOLIC PANEL
ALT: 25 IU/L (ref 0–44)
AST: 21 IU/L (ref 0–40)
Albumin/Globulin Ratio: 2.2 (ref 1.2–2.2)
Albumin: 4.4 g/dL (ref 3.7–4.7)
Alkaline Phosphatase: 164 IU/L — ABNORMAL HIGH (ref 39–117)
BUN/Creatinine Ratio: 13 (ref 10–24)
BUN: 37 mg/dL — ABNORMAL HIGH (ref 8–27)
Bilirubin Total: 1.1 mg/dL (ref 0.0–1.2)
CO2: 21 mmol/L (ref 20–29)
Calcium: 9.8 mg/dL (ref 8.6–10.2)
Chloride: 103 mmol/L (ref 96–106)
Creatinine, Ser: 2.84 mg/dL — ABNORMAL HIGH (ref 0.76–1.27)
GFR calc Af Amer: 24 mL/min/{1.73_m2} — ABNORMAL LOW (ref >59)
GFR calc non Af Amer: 21 mL/min/{1.73_m2} — ABNORMAL LOW (ref >59)
Globulin, Total: 2 g/dL (ref 1.5–4.5)
Glucose: 85 mg/dL (ref 65–99)
Potassium: 5.5 mmol/L — ABNORMAL HIGH (ref 3.5–5.2)
Sodium: 141 mmol/L (ref 134–144)
Total Protein: 6.4 g/dL (ref 6.0–8.5)

## 2018-11-01 LAB — CBC WITH DIFFERENTIAL/PLATELET
Basophils Absolute: 0 10*3/uL (ref 0.0–0.2)
Basos: 0 %
EOS (ABSOLUTE): 0.4 10*3/uL (ref 0.0–0.4)
Eos: 6 %
Hematocrit: 40.4 % (ref 37.5–51.0)
Hemoglobin: 13.7 g/dL (ref 13.0–17.7)
Immature Grans (Abs): 0 10*3/uL (ref 0.0–0.1)
Immature Granulocytes: 0 %
Lymphocytes Absolute: 1.3 10*3/uL (ref 0.7–3.1)
Lymphs: 16 %
MCH: 30 pg (ref 26.6–33.0)
MCHC: 33.9 g/dL (ref 31.5–35.7)
MCV: 88 fL (ref 79–97)
Monocytes Absolute: 1 10*3/uL — ABNORMAL HIGH (ref 0.1–0.9)
Monocytes: 13 %
Neutrophils Absolute: 5.1 10*3/uL (ref 1.4–7.0)
Neutrophils: 65 %
Platelets: 167 10*3/uL (ref 150–450)
RBC: 4.57 x10E6/uL (ref 4.14–5.80)
RDW: 14.6 % (ref 11.6–15.4)
WBC: 7.8 10*3/uL (ref 3.4–10.8)

## 2018-11-01 LAB — HEMOGLOBIN A1C
Est. average glucose Bld gHb Est-mCnc: 120 mg/dL
Hgb A1c MFr Bld: 5.8 % — ABNORMAL HIGH (ref 4.8–5.6)

## 2018-11-02 ENCOUNTER — Other Ambulatory Visit: Payer: Self-pay | Admitting: Family Medicine

## 2018-11-02 ENCOUNTER — Telehealth: Payer: Self-pay

## 2018-11-02 NOTE — Telephone Encounter (Signed)
Left message to call regarding lab results.

## 2018-11-02 NOTE — Telephone Encounter (Signed)
Patient lab results given to spouse.

## 2018-11-02 NOTE — Telephone Encounter (Signed)
Please review. Thanks!  

## 2018-11-02 NOTE — Telephone Encounter (Signed)
[facility] faxed refill request for the following medications:  amiodarone (PACERONE) 200 MG tablet   Please advise.

## 2018-11-02 NOTE — Telephone Encounter (Signed)
-----   Message from [name]., MD sent at 11/02/2018  8:21 AM EDT ----- Stable--please send copy to nephrology.

## 2018-11-03 MED ORDER — AMIODARONE HCL 200 MG PO TABS: 400.0000 mg | ORAL_TABLET | Freq: Two times a day (BID) | ORAL | 1 refills | Status: DC

## 2018-11-08 ENCOUNTER — Ambulatory Visit: Payer: Medicare Other | Admitting: Urology

## 2018-11-11 DIAGNOSIS — G4733 Obstructive sleep apnea (adult) (pediatric): Secondary | ICD-10-CM | POA: Diagnosis not present

## 2018-11-20 ENCOUNTER — Other Ambulatory Visit: Payer: Self-pay

## 2018-11-20 ENCOUNTER — Encounter: Payer: Self-pay | Admitting: Urology

## 2018-11-20 ENCOUNTER — Ambulatory Visit: Payer: Medicare Other | Admitting: Urology

## 2018-11-20 VITALS — BP 142/76 | HR 61 | Ht 70.0 in | Wt 249.0 lb

## 2018-11-20 DIAGNOSIS — B356 Tinea cruris: Secondary | ICD-10-CM

## 2018-11-20 DIAGNOSIS — N138 Other obstructive and reflux uropathy: Secondary | ICD-10-CM

## 2018-11-20 DIAGNOSIS — N3943 Post-void dribbling: Secondary | ICD-10-CM

## 2018-11-20 DIAGNOSIS — N401 Enlarged prostate with lower urinary tract symptoms: Secondary | ICD-10-CM | POA: Diagnosis not present

## 2018-11-20 LAB — BLADDER SCAN AMB NON-IMAGING

## 2018-11-20 MED ORDER — TERBINAFINE HCL 1 % EX CREA: 1.0000 "application " | TOPICAL_CREAM | Freq: Two times a day (BID) | CUTANEOUS | 0 refills | Status: DC

## 2018-11-20 NOTE — Patient Instructions (Signed)
Jock Itch    Jock itch is an itchy rash in the groin and upper thigh area. It is a skin infection that is caused by a type of germ that lives in dark, damp places (fungus). The rash usually goes away in 2-3 weeks with treatment.  Follow these instructions at home:  Skin care  · Use skin creams, ointments, or powders exactly as told by your doctor.  · Wear loose-fitting clothes. Clothes should not rub against your groin area. Men should wear boxer shorts or loose-fitting underwear.  · Keep your groin area clean and dry.  ? Change your underwear every day.  ? Change out of wet bathing suits as soon as you can.  ? After bathing, use a separate towel to dry your groin area. Dry the area gently and completely.  · Avoid hot baths and showers. Hot water can make itching worse.  · Do not scratch the area.  General instructions  · Take and apply over-the-counter and prescription medicines only as told by your doctor.  · Do not share towels or clothing with other people.  · Wash your hands often with soap and water, especially after touching your groin area. If you do not have soap and water, use alcohol-based hand sanitizer.  Contact a doctor if:  · Your rash:  ? Gets worse.  ? Does not get better after 2 weeks of treatment.  ? Spreads.  ? Comes back after treatment is done.  · You have any of the following:  ? A fever.  ? New or worsening redness, swelling, or pain around your rash.  ? Fluid, blood, or pus coming from your rash.  Summary  · Jock itch is an itchy rash. It affects the groin and upper thigh area.  · Jock itch usually goes away in 2-3 weeks with treatment.  · Keep your groin area clean and dry.  This information is not intended to replace advice given to you by your health care provider. Make sure you discuss any questions you have with your health care provider.  Document Released: 08/11/2009 Document Revised: 04/27/2017 Document Reviewed: 04/27/2017  Elsevier Interactive Patient Education © 2019 Elsevier  Inc.

## 2018-11-20 NOTE — Progress Notes (Signed)
11/20/2018 2:14 PM   Ronald [name] Mar 21, 1946 [id number]  Referring provider: Jerrol [name]., MD [address] [place],  [place] [address]  CC: Urinary dribbling  HPI: I saw [name] in urology clinic today in consultation for urinary dribbling from Dr. Rosanna [name].  He is a 73 year old male with extensive cardiac history who presents with chief complaint of urinary dribbling as well as groin rash.  He was previously followed by Dr[facility] Jacqlyn [name] at [name], and he underwent a PVP approximately 10 years ago.  He reports he voids with a very strong stream and feels he empties well.  PVR in clinic is 7 cc.  His IPSS score today is 3, with quality of life mostly satisfied.  He reports he will frequently have urinary dribbling of a few drops of urine if he hears running water or throughout the day at random times.  He denies any stress or urge incontinence.  This is been going on for 3 to 4 months.  Severity is mild.  He denies any gross hematuria or other urinary symptoms.  He also complains of a itchy and painful rash in the groin folds and scrotum.  He has tried powders for this before without improvement.  There are no aggravating factors.     PMH: Past Medical History:  Diagnosis Date  . AAA (abdominal aortic aneurysm) ([place])    a. 3cm by [place] 2015.  [name] Arthritis    "hips; back" (12/13/2014)  . CAD (coronary artery disease) 2007   a. s/p CABG- IMA-LAD, VG-Cx, VG-RCA, VG-diag in 1999. B. sp redo CABG- VG-OM, VG-RCA in 2007 due to VG disease. c. NSTEMI 11/2014 s/p DES to SVG-OM from the Y graft.d. PTCA/DES x 1 distal body of SVG to Diagonal.09/2015  . Chronic combined systolic and diastolic CHF (congestive heart failure) ([place])    a. remote EF 40-45% in 2006. b. Normal EF 2014. b. Echo 07/2016 EF 45-50%, grade 1 DD.  [name] Chronic lower back pain   . CKD (chronic kidney disease), stage IV ([place])   . COPD (chronic obstructive pulmonary disease) ([place])   . Deafness in left ear   . Degenerative  disc disease, lumbar   . Dilated cardiomyopathy ([place]) 10/07/2015  . Emphysema   . Esophageal stricture 07/02/1998   EGD  . GERD (gastroesophageal reflux disease)   . History of gout    "last flareup was in 2007" (12/13/2014)  . History of hiatal hernia   . Hyperlipidemia   . Hypertension   . Ischemic cardiomyopathy 2006   EF 40% to 50% by 2D echo in 2006;  Echo 12/31/12: Mild LVH, EF 50-55%, normal wall motion.   [name] PVC's (premature ventricular contractions)   . Renal artery stenosis ([place])    a. noted on CT 2008.  . Type II diabetes mellitus ([place])    Diet control   . Walking pneumonia 1990's    Surgical History: Past Surgical History:  Procedure Laterality Date  . CARDIAC CATHETERIZATION  "several"  . CARDIAC CATHETERIZATION N/A 12/13/2014   Procedure: Left Heart Cath and Coronary Angiography;  Surgeon: Jettie Booze, MD;  Location: [facility];  Service: Cardiovascular;  Laterality: N/A;  . CARDIAC CATHETERIZATION  12/13/2014   Procedure: Coronary Stent Intervention;  Surgeon: Jettie Booze, MD;  Location: [place];  Service: Cardiovascular;;  . CARDIAC CATHETERIZATION N/A 10/07/2015   Procedure: Left Heart Cath and Cors/Grafts Angiography;  Surgeon: Burnell Blanks, MD;  Location: [facility]  INVASIVE CV LAB;  Service: Cardiovascular;  Laterality: N/A;  . CARDIAC CATHETERIZATION N/A 10/07/2015   Procedure: Coronary Stent Intervention;  Surgeon: Burnell Blanks, MD;  Location: [place] [facility];  Service: Cardiovascular;  Laterality: N/A;  . CORONARY ANGIOPLASTY  "several"  . CORONARY ANGIOPLASTY WITH STENT PLACEMENT  2005; 12/13/2014   "2; 1"  . CORONARY ARTERY BYPASS GRAFT  1996   CABG X5  . CORONARY ARTERY BYPASS GRAFT  March 2007   CABG X3  . ESOPHAGOGASTRODUODENOSCOPY (EGD) WITH ESOPHAGEAL DILATION  2000  . GREEN LIGHT LASER TURP (TRANSURETHRAL RESECTION OF PROSTATE  2000's   "not cancerous"  . HERNIA REPAIR    . LAPAROSCOPIC CHOLECYSTECTOMY    .  LEFT HEART CATH AND CORS/GRAFTS ANGIOGRAPHY N/A 07/28/2017   Procedure: LEFT HEART CATH AND CORS/GRAFTS ANGIOGRAPHY;  Surgeon: Troy Sine, MD;  Location: [facility];  Service: Cardiovascular;  Laterality: N/A;  . LUNG SURGERY  1996   "S/P CABG, had to put staple in lung after it had collapsed"  . UMBILICAL HERNIA REPAIR     w/chole    Family History: Family History  Problem Relation Age of Onset  . Heart attack Mother        MI  . Stroke Mother   . Heart disease Mother   . Hypertension Mother   . Hyperlipidemia Mother   . Heart disease Father   . Rheumatic fever Father   . Colon cancer Neg Hx     Social History:  reports that he quit smoking about 32 years ago. His smoking use included cigarettes. He has a 60.00 pack-year smoking history. He has never used smokeless tobacco. He reports that he does not drink alcohol or use drugs.  ROS: Please see flowsheet from today's date for complete review of systems.  Physical Exam: BP (!) 142/76 (BP Location: Left Arm, Patient Position: Sitting)   Pulse 61   Ht 5\' 10"  (1.778 m)   Wt 249 lb (112.9 kg)   BMI 35.73 kg/m    Constitutional:  Alert and oriented, No acute distress. Cardiovascular: No clubbing, cyanosis, or edema. Respiratory: Normal respiratory effort, no increased work of breathing. GI: Abdomen is obese ,soft, nontender, nondistended, no abdominal masses GU: No CVA tenderness, uncircumcised phallus without lesions, widely patent meatus, erythematous rash in groin creases, worse in the right inguinal fold. Lymph: No cervical or inguinal lymphadenopathy. Neurologic: Grossly intact, no focal deficits, moving all 4 extremities. Psychiatric: Normal mood and affect.  Pertinent Imaging: None to review  Assessment & Plan:   In summary, the patient is a 73 year old comorbid male with extensive cardiac history, and history of PVP of the prostate for urinary symptoms who presents with 3 to 4 months of urinary  dribbling not related with stress or urgency, as well as bothersome groin rash.  We discussed that he is emptying well and urinary dribbling without sensation of stress or urge is challenging to manage.  I recommended a trial of a Cunningham clamp for his urinary dribbling.  Regarding his groin rash, I recommended a trial of Lamisil with close follow-up with our PA for a symptom check in 4 weeks.  Trial of Cunningham clamp for urinary dribbling Lamisil for groin rash RTC w/ [place] in 4 weeks for symptom check, consider nystatin if not improved  Billey Co, MD  [facility] [address], [place], [place] [address] [phone]

## 2018-11-22 DIAGNOSIS — K219 Gastro-esophageal reflux disease without esophagitis: Secondary | ICD-10-CM | POA: Diagnosis not present

## 2018-11-22 DIAGNOSIS — G4733 Obstructive sleep apnea (adult) (pediatric): Secondary | ICD-10-CM | POA: Diagnosis not present

## 2018-12-11 DIAGNOSIS — G4733 Obstructive sleep apnea (adult) (pediatric): Secondary | ICD-10-CM | POA: Diagnosis not present

## 2018-12-12 DIAGNOSIS — H353132 Nonexudative age-related macular degeneration, bilateral, intermediate dry stage: Secondary | ICD-10-CM | POA: Diagnosis not present

## 2018-12-17 NOTE — Progress Notes (Signed)
12/18/2018 1:11 PM   [name] 01/04/1946 [id number]  Referring provider: [name]., MD [address] [place],  [place] [address]  CC: Urinary dribbling  HPI: [name] is a 73 year old male with urinary dribbling and tinea cruris who presents today for a four week follow up.   Background history I saw [name] in urology clinic today in consultation for urinary dribbling from Dr. [name].  He is a 73 year old male with extensive cardiac history who presents with chief complaint of urinary dribbling as well as groin rash.  He was previously followed by Dr. Jacqlyn Larsen at [facility], and he underwent a PVP approximately 10 years ago.  He reports he voids with a very strong stream and feels he empties well.  PVR in clinic is 7 cc.  His IPSS score today is 3, with quality of life mostly satisfied.  He reports he will frequently have urinary dribbling of a few drops of urine if he hears running water or throughout the day at random times.  He denies any stress or urge incontinence.  This is been going on for 3 to 4 months.  Severity is mild.  He denies any gross hematuria or other urinary symptoms.  He also complains of a itchy and painful rash in the groin folds and scrotum.  He has tried powders for this before without improvement.  There are no aggravating factors.    He was given an incontinence clamp and lamisil cream   He states he did not acquire the incontinence clamp as he feels the leakage is not that bad.  The Lamisil cream has worked well for him.  He states he is no longer itching.  He has not yet used all the tube.    Patient denies any gross hematuria, dysuria or suprapubic/flank pain.  Patient denies any fevers, chills, nausea or vomiting.   Of note, his oldest son has COVID-68.  He is feeling better after a week of symptoms.  [name] has not been exposed to his son.       PMH: Past Medical History:  Diagnosis Date  . AAA (abdominal aortic aneurysm)  ([place])    a. 3cm by [place] 2015.  [name] Arthritis    "hips; back" (12/13/2014)  . CAD (coronary artery disease) 2007   a. s/p CABG- IMA-LAD, VG-Cx, VG-RCA, VG-diag in 1999. B. sp redo CABG- VG-OM, VG-RCA in 2007 due to VG disease. c. NSTEMI 11/2014 s/p DES to SVG-OM from the Y graft.d. PTCA/DES x 1 distal body of SVG to Diagonal.09/2015  . Chronic combined systolic and diastolic CHF (congestive heart failure) ([place])    a. remote EF 40-45% in 2006. b. Normal EF 2014. b. Echo 07/2016 EF 45-50%, grade 1 DD.  [name] Chronic lower back pain   . CKD (chronic kidney disease), stage IV ([place])   . COPD (chronic obstructive pulmonary disease) ([place])   . Deafness in left ear   . Degenerative disc disease, lumbar   . Dilated cardiomyopathy ([place]) 10/07/2015  . Emphysema   . Esophageal stricture 07/02/1998   EGD  . GERD (gastroesophageal reflux disease)   . History of gout    "last flareup was in 2007" (12/13/2014)  . History of hiatal hernia   . Hyperlipidemia   . Hypertension   . Ischemic cardiomyopathy 2006   EF 40% to 50% by 2D echo in 2006;  Echo 12/31/12: Mild LVH, EF 50-55%, normal wall motion.   [name] PVC's (premature  ventricular contractions)   . Renal artery stenosis ([place])    a. noted on CT 2008.  . Type II diabetes mellitus ([place])    Diet control   . Walking pneumonia 1990's    Surgical History: Past Surgical History:  Procedure Laterality Date  . CARDIAC CATHETERIZATION  "several"  . CARDIAC CATHETERIZATION N/A 12/13/2014   Procedure: Left Heart Cath and Coronary Angiography;  Surgeon: Jettie Booze, MD;  Location: [facility];  Service: Cardiovascular;  Laterality: N/A;  . CARDIAC CATHETERIZATION  12/13/2014   Procedure: Coronary Stent Intervention;  Surgeon: Jettie Booze, MD;  Location: [facility] LAB;  Service: Cardiovascular;;  . CARDIAC CATHETERIZATION N/A 10/07/2015   Procedure: Left Heart Cath and Cors/Grafts Angiography;  Surgeon: Burnell Blanks, MD;  Location: [facility]  CV LAB;  Service: Cardiovascular;  Laterality: N/A;  . CARDIAC CATHETERIZATION N/A 10/07/2015   Procedure: Coronary Stent Intervention;  Surgeon: Burnell Blanks, MD;  Location: [place] LAB;  Service: Cardiovascular;  Laterality: N/A;  . CORONARY ANGIOPLASTY  "several"  . CORONARY ANGIOPLASTY WITH STENT PLACEMENT  2005; 12/13/2014   "2; 1"  . CORONARY ARTERY BYPASS GRAFT  1996   CABG X5  . CORONARY ARTERY BYPASS GRAFT  March 2007   CABG X3  . ESOPHAGOGASTRODUODENOSCOPY (EGD) WITH ESOPHAGEAL DILATION  2000  . GREEN LIGHT LASER TURP (TRANSURETHRAL RESECTION OF PROSTATE  2000's   "not cancerous"  . HERNIA REPAIR    . LAPAROSCOPIC CHOLECYSTECTOMY    . LEFT HEART CATH AND CORS/GRAFTS ANGIOGRAPHY N/A 07/28/2017   Procedure: LEFT HEART CATH AND CORS/GRAFTS ANGIOGRAPHY;  Surgeon: Troy Sine, MD;  Location: [facility] LAB;  Service: Cardiovascular;  Laterality: N/A;  . LUNG SURGERY  1996   "S/P CABG, had to put staple in lung after it had collapsed"  . UMBILICAL HERNIA REPAIR     w/chole    Family History: Family History  Problem Relation Age of Onset  . Heart attack Mother        MI  . Stroke Mother   . Heart disease Mother   . Hypertension Mother   . Hyperlipidemia Mother   . Heart disease Father   . Rheumatic fever Father   . Colon cancer Neg Hx     Social History:  reports that he quit smoking about 32 years ago. His smoking use included cigarettes. He has a 60.00 pack-year smoking history. He has never used smokeless tobacco. He reports that he does not drink alcohol or use drugs.  ROS: Please see flowsheet from today's date for complete review of systems.  Physical Exam: BP (!) 151/71   Pulse 65   Temp (!) 97.2 F (36.2 C) (Oral)   Resp 16   Ht 5\' 9"  (1.753 m)   Wt 252 lb (114.3 kg)   SpO2 98%   BMI 37.21 kg/m   Constitutional:  Well nourished. Alert and oriented, No acute distress. HEENT: [place] AT, moist mucus membranes.  Trachea midline, no masses.  Cardiovascular: No clubbing, cyanosis, or edema. Respiratory: Normal respiratory effort, no increased work of breathing. GI: Abdomen is soft, non tender, non distended, no abdominal masses. Liver and spleen not palpable.  No hernias appreciated.  Stool sample for occult testing is not indicated.   GU: No CVA tenderness.  No bladder fullness or masses.  Patient with uncircumcised phallus. Foreskin easily retracted.   Urethral meatus is patent.  No penile discharge. No penile lesions or rashes. Scrotum without  lesions, cysts, rashes and/or edema.  Testicles are located scrotally bilaterally. No masses are appreciated in the testicles. Left and right epididymis are normal. Rectal: Not performed.   Skin: No rashes, bruises or suspicious lesions.  Rash in the groin area is now a light pink.  Lymph: No cervical or inguinal adenopathy. Neurologic: Grossly intact, no focal deficits, moving all 4 extremities. Psychiatric: Normal mood and affect.  Pertinent Imaging: None to review  Assessment & Plan:    1. Urinary dribbling He did not obtain the incontinence clamp He does not feel the dribbling is bothersome at this time He will contact us if he feels the dribbling is worsening   2. Tinea cruris Resolving  Refill for Lamisil given RTC if symptoms do not improve or worsen   Zara Council, PA-C  [facility] [address], [place], [place] [address] [phone]

## 2018-12-18 ENCOUNTER — Other Ambulatory Visit: Payer: Self-pay

## 2018-12-18 ENCOUNTER — Ambulatory Visit (INDEPENDENT_AMBULATORY_CARE_PROVIDER_SITE_OTHER): Payer: Medicare Other | Admitting: Urology

## 2018-12-18 ENCOUNTER — Encounter: Payer: Self-pay | Admitting: Urology

## 2018-12-18 VITALS — BP 151/71 | HR 65 | Temp 97.2°F | Resp 16 | Ht 69.0 in | Wt 252.0 lb

## 2018-12-18 DIAGNOSIS — N3943 Post-void dribbling: Secondary | ICD-10-CM

## 2018-12-18 DIAGNOSIS — B356 Tinea cruris: Secondary | ICD-10-CM | POA: Diagnosis not present

## 2018-12-18 DIAGNOSIS — M79606 Pain in leg, unspecified: Secondary | ICD-10-CM | POA: Insufficient documentation

## 2018-12-18 DIAGNOSIS — I4891 Unspecified atrial fibrillation: Secondary | ICD-10-CM | POA: Diagnosis not present

## 2018-12-18 DIAGNOSIS — M7070 Other bursitis of hip, unspecified hip: Secondary | ICD-10-CM | POA: Insufficient documentation

## 2018-12-18 DIAGNOSIS — I10 Essential (primary) hypertension: Secondary | ICD-10-CM | POA: Diagnosis not present

## 2018-12-18 DIAGNOSIS — G4733 Obstructive sleep apnea (adult) (pediatric): Secondary | ICD-10-CM | POA: Diagnosis not present

## 2018-12-18 MED ORDER — TERBINAFINE HCL 1 % EX CREA: 1.0000 "application " | TOPICAL_CREAM | Freq: Two times a day (BID) | CUTANEOUS | 0 refills | Status: DC

## 2019-01-03 ENCOUNTER — Ambulatory Visit: Payer: Medicare Other | Admitting: Family Medicine

## 2019-01-08 ENCOUNTER — Telehealth: Payer: Self-pay | Admitting: Cardiovascular Disease

## 2019-01-08 NOTE — Telephone Encounter (Signed)
New message   Patient's wife is wanting to make sure that paperwork was received for [facility]. Please advise.

## 2019-01-08 NOTE — Telephone Encounter (Signed)
Paperwork is not in Dr. Camillia Herter box. I spoke with pt's wife. She reports we should be receiving faxed paperwork from [facility].  This is for Xarelto assistance.  Pt's wife requests we call her back if paperwork not received by end of the week.

## 2019-01-10 NOTE — Telephone Encounter (Signed)
**Note De-Identified  Obfuscation** We did receive the provider part of the pts Ronald [name] asst application from [facility]. We have completed the MD part of application, Dr Rayann Heman (DOD) signed it and we faxed back to [facility] @ [phone].

## 2019-01-11 DIAGNOSIS — G4733 Obstructive sleep apnea (adult) (pediatric): Secondary | ICD-10-CM | POA: Diagnosis not present

## 2019-01-31 DIAGNOSIS — N183 Chronic kidney disease, stage 3 (moderate): Secondary | ICD-10-CM | POA: Diagnosis not present

## 2019-01-31 DIAGNOSIS — E872 Acidosis: Secondary | ICD-10-CM | POA: Diagnosis not present

## 2019-01-31 DIAGNOSIS — I10 Essential (primary) hypertension: Secondary | ICD-10-CM | POA: Diagnosis not present

## 2019-01-31 DIAGNOSIS — N2581 Secondary hyperparathyroidism of renal origin: Secondary | ICD-10-CM | POA: Diagnosis not present

## 2019-01-31 DIAGNOSIS — R600 Localized edema: Secondary | ICD-10-CM | POA: Diagnosis not present

## 2019-02-06 ENCOUNTER — Telehealth: Payer: Self-pay | Admitting: Family Medicine

## 2019-02-06 NOTE — Telephone Encounter (Signed)
Pt needs  a refill on Amlodipine   The rx is for 5 mg bid or should he take 10mg  once a day  90 days supply  [facility] [place]  Thanks teri

## 2019-02-06 NOTE — Telephone Encounter (Signed)
Which do you prefer? Please review. Thanks!

## 2019-02-06 NOTE — Telephone Encounter (Signed)
10mg  daily is easier.

## 2019-02-07 DIAGNOSIS — E875 Hyperkalemia: Secondary | ICD-10-CM | POA: Diagnosis not present

## 2019-02-07 DIAGNOSIS — N184 Chronic kidney disease, stage 4 (severe): Secondary | ICD-10-CM | POA: Diagnosis not present

## 2019-02-07 DIAGNOSIS — E872 Acidosis: Secondary | ICD-10-CM | POA: Diagnosis not present

## 2019-02-07 DIAGNOSIS — R600 Localized edema: Secondary | ICD-10-CM | POA: Diagnosis not present

## 2019-02-07 DIAGNOSIS — I129 Hypertensive chronic kidney disease with stage 1 through stage 4 chronic kidney disease, or unspecified chronic kidney disease: Secondary | ICD-10-CM | POA: Diagnosis not present

## 2019-02-07 MED ORDER — AMLODIPINE BESYLATE 10 MG PO TABS: 10.0000 mg | ORAL_TABLET | Freq: Every day | ORAL | 3 refills | Status: DC

## 2019-02-07 NOTE — Telephone Encounter (Signed)
Medication was sent into the pharmacy.  

## 2019-02-09 IMAGING — CT CT ABD-PELV W/O CM
2 of 4 series · 15 of 46 positions shown, 17 images · non-contrast
Comparison: CT abdomen October 21, 2006

CLINICAL DATA: Chronic abdominal pain

EXAM:
CT ABDOMEN AND PELVIS WITHOUT CONTRAST
TECHNIQUE: Multidetector CT imaging of the abdomen and pelvis was performed
following the standard protocol without IV contrast. Oral contrast
was administered.

[Series 3: ap without · axial · non-contrast · 0.90mm/px · z∈[+842,+1277]mm · 12 of 99 slices shown, 14 images]
[im 6/99  soft-tissue]
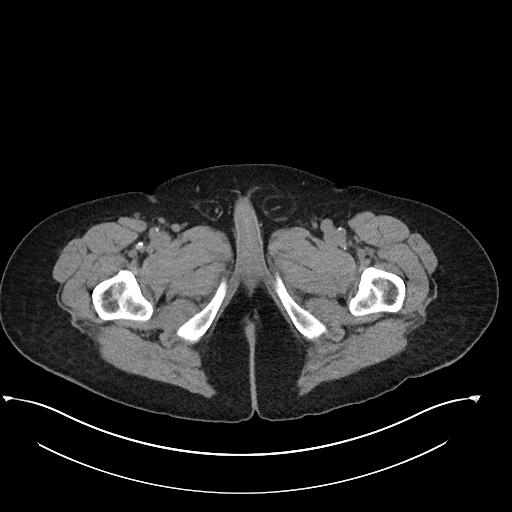
[im 6/99  bone]
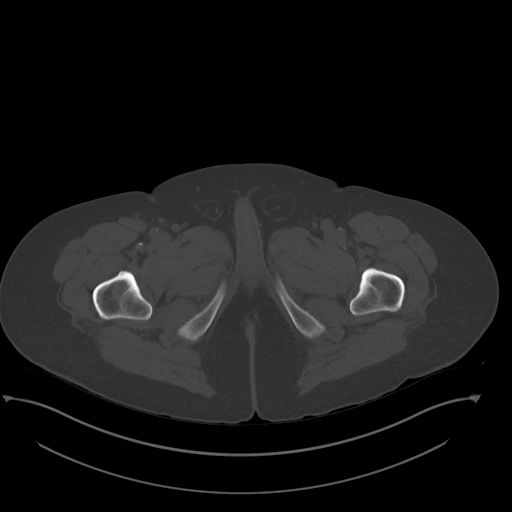
[im 18/99  soft-tissue]
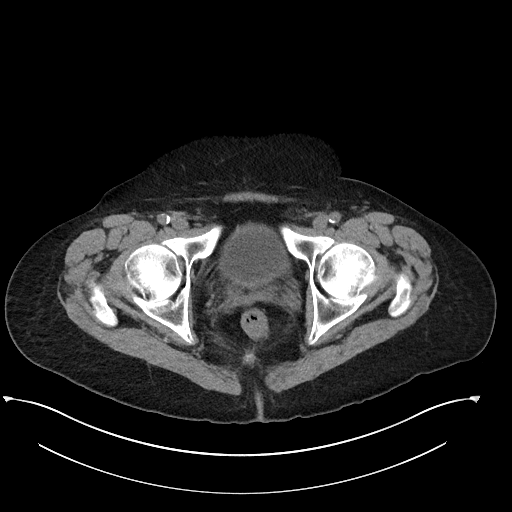
[im 24/99  soft-tissue]
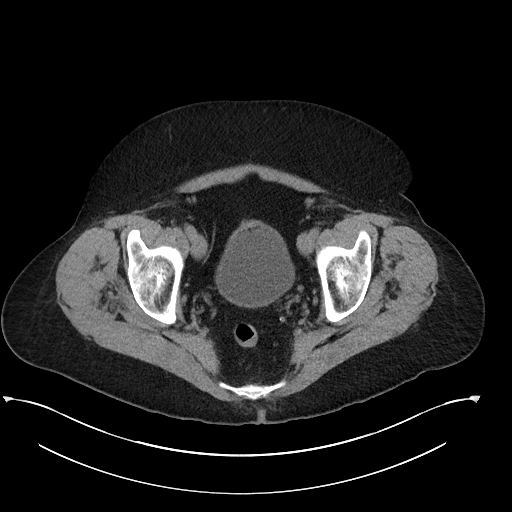
[im 29/99  soft-tissue]
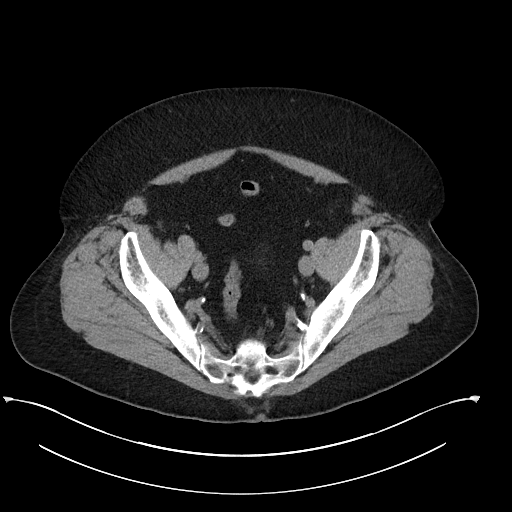
[im 41/99  soft-tissue]
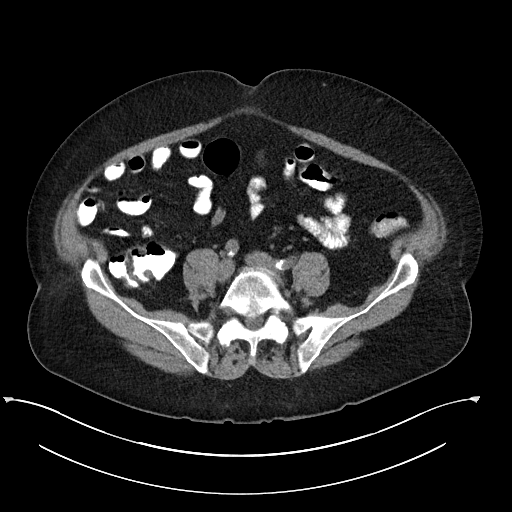
[im 47/99  soft-tissue]
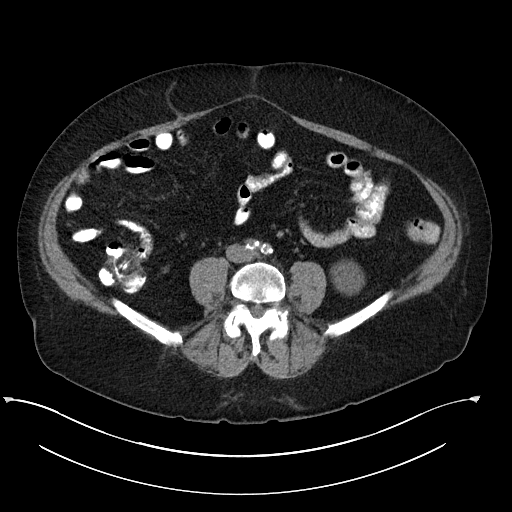
[im 52/99  soft-tissue]
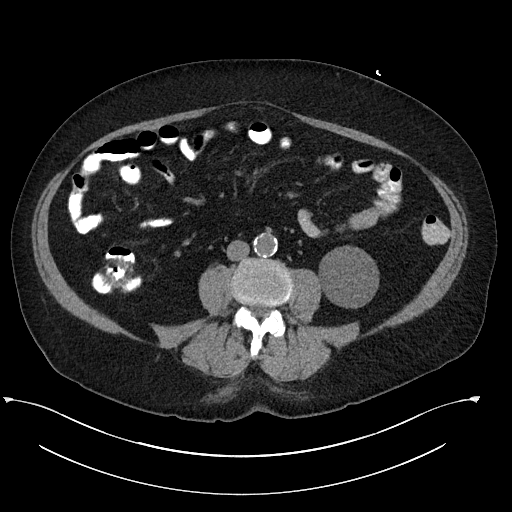
[im 64/99  soft-tissue]
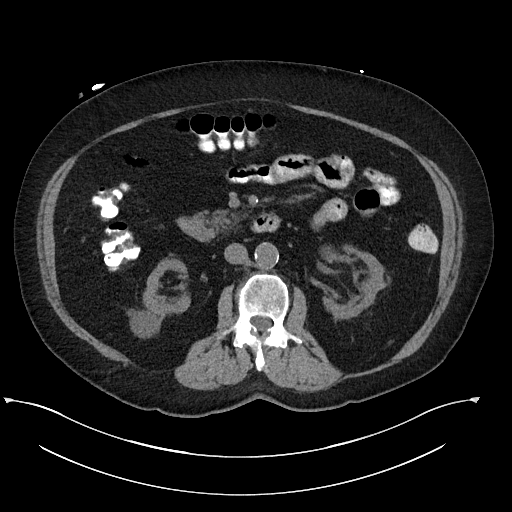
[im 70/99  soft-tissue]
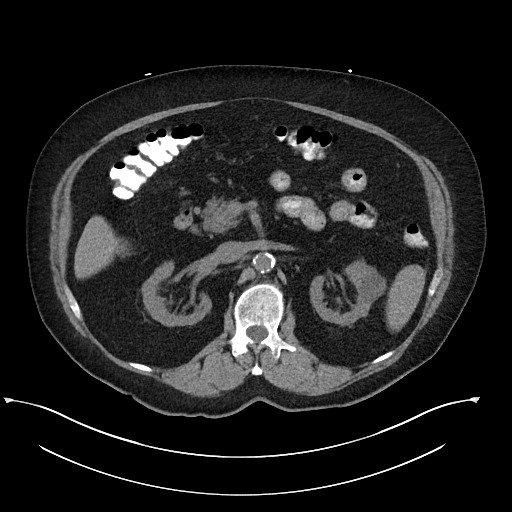
[im 70/99  bone]
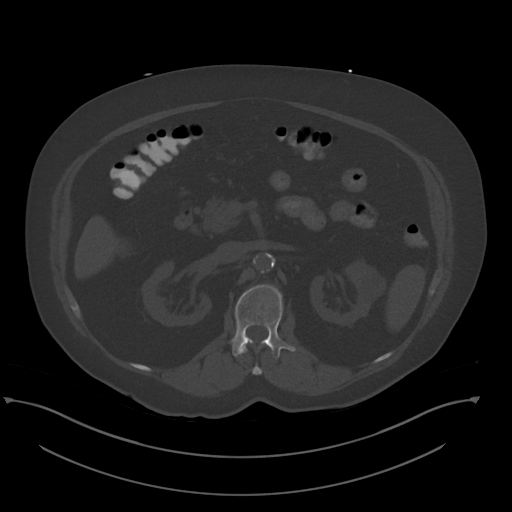
[im 75/99  soft-tissue]
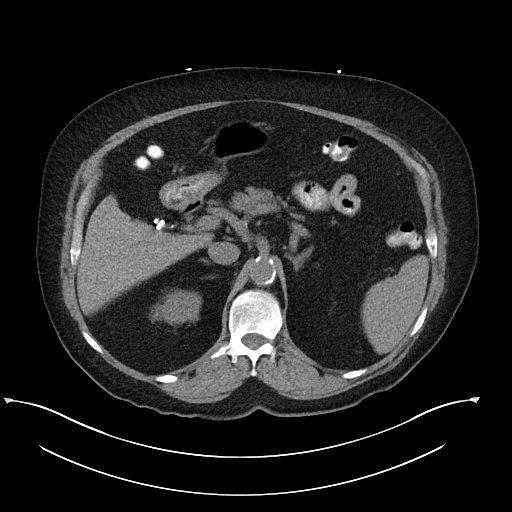
[im 87/99  soft-tissue]
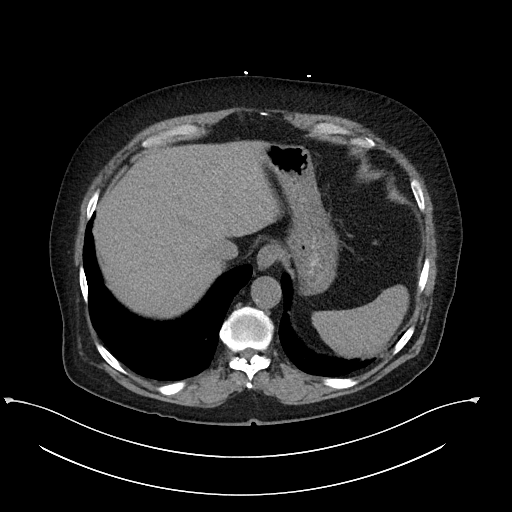
[im 93/99  soft-tissue]
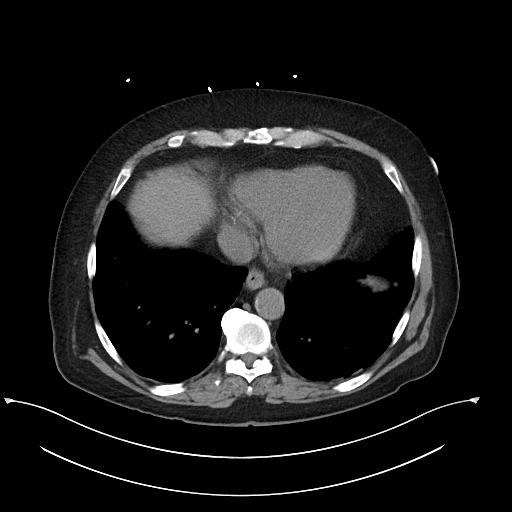

[Series 6: cor · coronal · 0.87mm/px · 3 of 103 slices shown]
[im 35/103  soft-tissue]
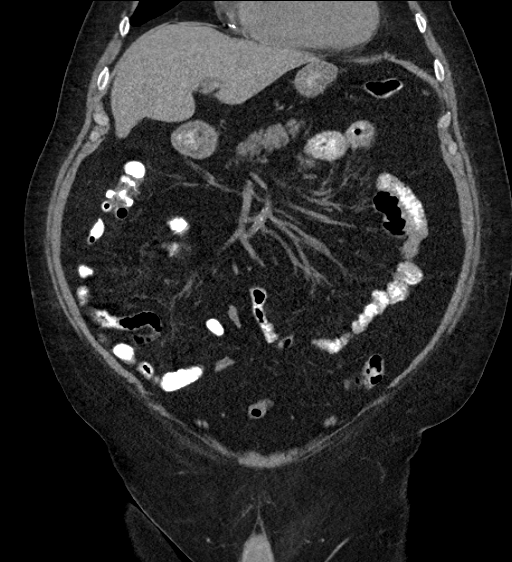
[im 46/103  soft-tissue]
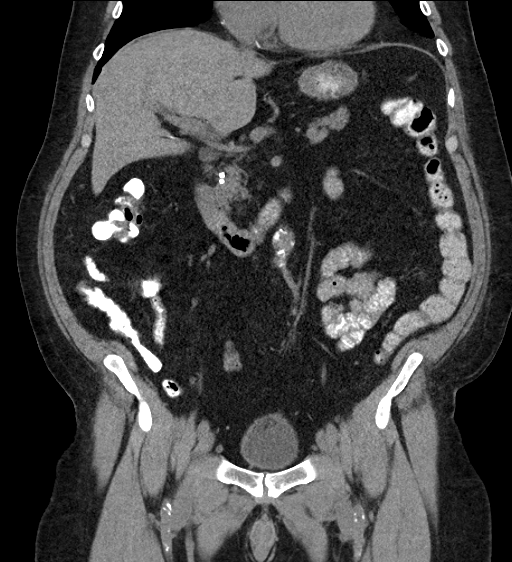
[im 57/103  soft-tissue]
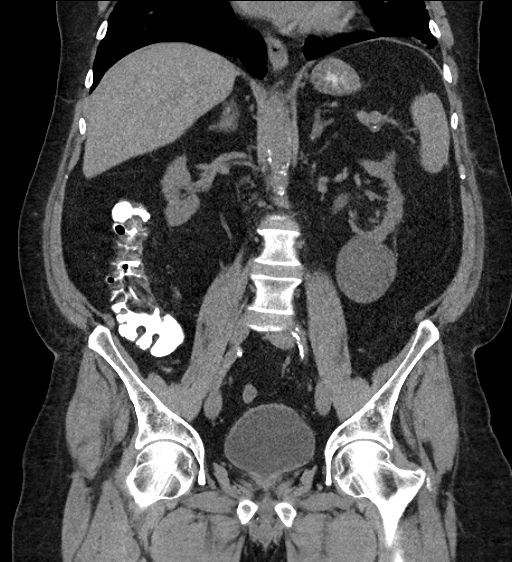

[15 of 46 positions shown; findings below may reference images not displayed]

FINDINGS: Lower chest: There is patchy bibasilar atelectatic change. There is
a nodular opacity in the inferior lingula measuring 9 x 9 mm, seen
on axial slice 6 series 5..

Hepatobiliary: No focal liver lesions are evident on this
noncontrast enhanced study. Gallbladder is absent. There is no
appreciable biliary duct dilatation.

Pancreas: No pancreatic mass or inflammatory focus.

Spleen: No splenic lesions are evident.

Adrenals/Urinary Tract: Adrenals bilaterally appear normal. There is
a cyst arising from the mid left kidney measuring 3.3 x 2.8 cm.
There is a cyst arising from the anterior lower pole left kidney
measuring 2.5 x 1.8 cm. There is a cyst arising from the lower pole
left kidney measuring 2.1 x 2.0 cm. There is a cyst arising from the
posterior mid right kidney measuring 3.2 x 3.2 cm. There is no
evident hydronephrosis on either side. There is no renal or ureteral
calculus on either side. Urinary bladder is midline with wall
thickness within normal limits.

Stomach/Bowel: There is no appreciable bowel wall or mesenteric
thickening. No evident bowel obstruction. No appreciable free air or
portal venous air.

Vascular/Lymphatic: There is atherosclerotic calcification
throughout the aorta and common iliac arteries. There is also
internal and external iliac artery atherosclerotic calcification.
There is slight dilatation of the distal abdominal aorta with a
maximum transverse diameter of 2.9 x 2.6 cm. No periaortic fluid
evident. Major mesenteric vessels appear patent on this noncontrast
enhanced study, although there is moderate atherosclerotic
calcification in the proximal major mesenteric arterial vessels.
There is moderate atherosclerotic calcification in each common
femoral artery. There is no appreciable adenopathy in the abdomen or
pelvis.

Reproductive: There are prostatic calculi. Prostate and seminal
vesicles appear normal in size and contour. No evident pelvic mass.

Other: Appendix appears unremarkable. There is no ascites or abscess
in the abdomen or pelvis. There is a fairly small ventral hernia
containing only fat.

Musculoskeletal: There is degenerative change throughout portions of
the lumbar spine. There are no blastic or lytic bone lesions. There
is no intramuscular or abdominal wall lesion.
IMPRESSION: 1. 9 x 9 mm nodular opacity in the inferior lingula. Consider one of
the following in 3 months for both low-risk and high-risk
individuals: (a) repeat chest CT, (b) follow-up PET-CT, or (c)
tissue sampling. This recommendation follows the consensus
statement: Guidelines for Management of Incidental Pulmonary Nodules
Detected on CT Images: From the [HOSPITAL] 6958; Radiology
6958; [DATE].

2.  No bowel obstruction.  No abscess.  Appendix appears normal.

3. No renal or ureteral calculi. There are prostatic calculi. No
hydronephrosis on either side.

4. Multifocal aortoiliac and femoral arterial atherosclerosis as
well as multiple foci of mesenteric arterial atherosclerosis.
Dilatation of the distal abdominal aorta with a maximum transverse
diameter of 2.9 x 2.6 cm. Ectatic abdominal aorta at risk for
aneurysm development. Recommend followup by ultrasound in 5 years.
This recommendation follows ACR consensus guidelines: White Paper of
the ACR Incidental Findings Committee II on Vascular Findings. [HOSPITAL] 6217; [DATE].

5.  Fairly small ventral hernia containing only fat.

6.  Gallbladder absent.

Aortic Atherosclerosis (ZIJU5-GQA.A).

## 2019-02-11 DIAGNOSIS — G4733 Obstructive sleep apnea (adult) (pediatric): Secondary | ICD-10-CM | POA: Diagnosis not present

## 2019-02-12 ENCOUNTER — Other Ambulatory Visit: Payer: Self-pay | Admitting: Family Medicine

## 2019-02-12 MED ORDER — AMLODIPINE BESYLATE 10 MG PO TABS: 10.0000 mg | ORAL_TABLET | Freq: Every day | ORAL | 3 refills | Status: DC

## 2019-02-12 NOTE — Telephone Encounter (Signed)
Medication was sent into the pharmacy.  

## 2019-02-12 NOTE — Telephone Encounter (Signed)
[facility] faxed refill request for the following medications: ? ?amLODipine (NORVASC) 10 MG tablet  ? ?Please advise. ? ?

## 2019-02-21 DIAGNOSIS — N184 Chronic kidney disease, stage 4 (severe): Secondary | ICD-10-CM | POA: Diagnosis not present

## 2019-03-01 ENCOUNTER — Other Ambulatory Visit: Payer: Self-pay | Admitting: Family Medicine

## 2019-03-01 ENCOUNTER — Other Ambulatory Visit: Payer: Self-pay

## 2019-03-01 ENCOUNTER — Ambulatory Visit (INDEPENDENT_AMBULATORY_CARE_PROVIDER_SITE_OTHER): Payer: Medicare Other | Admitting: Family Medicine

## 2019-03-01 VITALS — BP 138/58 | HR 62 | Resp 16 | Wt 255.0 lb

## 2019-03-01 DIAGNOSIS — E1121 Type 2 diabetes mellitus with diabetic nephropathy: Secondary | ICD-10-CM | POA: Diagnosis not present

## 2019-03-01 DIAGNOSIS — I25110 Atherosclerotic heart disease of native coronary artery with unstable angina pectoris: Secondary | ICD-10-CM | POA: Diagnosis not present

## 2019-03-01 DIAGNOSIS — Z23 Encounter for immunization: Secondary | ICD-10-CM | POA: Diagnosis not present

## 2019-03-01 DIAGNOSIS — G4733 Obstructive sleep apnea (adult) (pediatric): Secondary | ICD-10-CM | POA: Diagnosis not present

## 2019-03-01 DIAGNOSIS — I4891 Unspecified atrial fibrillation: Secondary | ICD-10-CM | POA: Diagnosis not present

## 2019-03-01 DIAGNOSIS — I10 Essential (primary) hypertension: Secondary | ICD-10-CM

## 2019-03-01 LAB — POCT GLYCOSYLATED HEMOGLOBIN (HGB A1C)
Est. average glucose Bld gHb Est-mCnc: 123
Hemoglobin A1C: 5.9 % — AB (ref 4.0–5.6)

## 2019-03-01 NOTE — Progress Notes (Signed)
Patient: Ronald [name] [facility] Male    DOB: 1945-11-08   73 y.o.   MRN: [id number] Visit Date: 03/01/2019  Today's Provider: [name], MD   Chief Complaint  Patient presents with  . Diabetes  . Hypertension  . Atrial Fibrillation   Subjective:     HPI  All issues are stable.  Patient does not feel well but has not felt well in a couple of years.  He complains of chronic right hip pain and has problems finding a comfortable position either sitting or lying down.  No signs or symptoms of either coronary artery disease or congestive heart failure recently.  Diabetes Mellitus Type II, Follow-up:   Lab Results  Component Value Date   HGBA1C 5.9 (A) 03/01/2019   HGBA1C 5.8 (H) 10/31/2018   HGBA1C 5.9 (H) 11/22/2017    Last seen for diabetes 4 months ago.  Management since then includes no changes. He reports good compliance with treatment. He is not having side effects.  Current symptoms include none and have been stable. Home blood sugar records: fasting range: 100s  Episodes of hypoglycemia? no   Current insulin regiment: Is not on insulin Most Recent Eye Exam: up to date Weight trend: stable Prior visit with dietician: No Current exercise: no regular exercise Current diet habits: well balanced  Pertinent Labs:    Component Value Date/Time   CHOL 115 11/22/2017 0931   TRIG 68 11/22/2017 0931   HDL 45 11/22/2017 0931   LDLCALC 56 11/22/2017 0931   CREATININE 2.84 (H) 10/31/2018 1340   CREATININE 2.44 (H) 11/24/2015 1546    Wt Readings from Last 3 Encounters:  03/01/19 255 lb (115.7 kg)  12/18/18 252 lb (114.3 kg)  11/20/18 249 lb (112.9 kg)    Hypertension, follow-up:  BP Readings from Last 3 Encounters:  03/01/19 (!) 138/58  12/18/18 (!) 151/71  11/20/18 (!) 142/76    He was last seen for hypertension 4 months ago.  BP at that visit was 151/71. Management since that visit includes no changes. He reports good compliance with treatment. He  is not having side effects.  He is not exercising. He is adherent to low salt diet.   Outside blood pressures are checked occasionally, and patient reports that this is stable. He is experiencing irregular heart beat, but patient reports that this is normal.   Patient denies exertional chest pressure/discomfort.     Allergies  Allergen Reactions  . Predicort [Prednisolone] Other (See Comments)    Stomach pain  . Ciprofloxacin     GI upset  . Hydrochlorothiazide Other (See Comments)    Dehydration  . Hydrocodone     Stomach upset  . Hydrocodone-Acetaminophen     Stomach upset  . Hydrocodone-Acetaminophen Nausea Only  . Loratadine Other (See Comments)    Other reaction(s): Unknown  . Sulfa Antibiotics Other (See Comments)    Cannot recall  . Sulfacetamide Sodium Other (See Comments)    Cannot recall  . Sulfasalazine     Other reaction(s): Other (See Comments) Cannot recall  . Hydrocodone-Acetaminophen Nausea Only  . Penicillins Hives and Rash    Has patient had a PCN reaction causing immediate rash, facial/tongue/throat swelling, SOB or lightheadedness with hypotension: YES Has patient had a PCN reaction causing severe rash involving mucus membranes or skin necrosis: NO Has patient had a PCN reaction that required hospitalization NO Has patient had a PCN reaction occurring within the last 10 years:NO If all of the  above answers are "NO", then may proceed with Cephalosporin use.     Current Outpatient Medications:  .  allopurinol (ZYLOPRIM) 100 MG tablet, TAKE 1 TABLET BY MOUTH AT  BEDTIME, Disp: 90 tablet, Rfl: 3 .  ALLOPURINOL PO, , Disp: , Rfl:  .  amiodarone (PACERONE) 200 MG tablet, Take 2 tablets (400 mg total) by mouth 2 (two) times daily., Disp: 360 tablet, Rfl: 1 .  amLODipine (NORVASC) 10 MG tablet, Take 1 tablet (10 mg total) by mouth daily., Disp: 90 tablet, Rfl: 3 .  aspirin EC 81 MG tablet, Take 1 tablet (81 mg total) by mouth daily., Disp: 90 tablet, Rfl: 3 .   atorvastatin (LIPITOR) 40 MG tablet, TAKE 1 TABLET BY MOUTH  DAILY, Disp: 90 tablet, Rfl: 2 .  atorvastatin (LIPITOR) 40 MG tablet, , Disp: , Rfl:  .  calcium-vitamin D (OSCAL WITH D) 500-200 MG-UNIT tablet, Take 1 tablet by mouth., Disp: , Rfl:  .  furosemide (LASIX) 40 MG tablet, Take 40 mg by mouth daily as needed for fluid or edema. , Disp: , Rfl: 6 .  furosemide (LASIX) 40 MG tablet, , Disp: , Rfl:  .  isosorbide mononitrate (IMDUR) 30 MG 24 hr tablet, TAKE 1 TABLET BY MOUTH  DAILY, Disp: 90 tablet, Rfl: 2 .  isosorbide mononitrate (IMDUR) 30 MG 24 hr tablet, , Disp: , Rfl:  .  loratadine (CLARITIN) 10 MG tablet, Take 10 mg by mouth daily as needed for allergies., Disp: , Rfl:  .  magnesium oxide (MAG-OX) 400 MG tablet, TAKE 1 TABLET BY MOUTH TWICE A DAY, Disp: 180 tablet, Rfl: 3 .  metoprolol tartrate (LOPRESSOR) 100 MG tablet, Take 1 tablet (100 mg total) by mouth 2 (two) times daily., Disp: 180 tablet, Rfl: 3 .  metoprolol tartrate (LOPRESSOR) 100 MG tablet, , Disp: , Rfl:  .  nitroGLYCERIN (NITROSTAT) 0.4 MG SL tablet, Place 0.4 mg under the tongue every 5 (five) minutes as needed for chest pain (Up to 3 times)., Disp: , Rfl:  .  pantoprazole (PROTONIX) 40 MG tablet, Take 2 tablets (80 mg total) by mouth daily., Disp: 180 tablet, Rfl: 3 .  pantoprazole (PROTONIX) 40 MG tablet, , Disp: , Rfl:  .  Rivaroxaban (XARELTO) 15 MG TABS tablet, Take 1 tablet (15 mg total) by mouth daily with supper., Disp: 30 tablet, Rfl: 11 .  sodium bicarbonate 650 MG tablet, Take 650 mg by mouth 2 (two) times daily., Disp: , Rfl:  .  terbinafine (LAMISIL AT) 1 % cream, Apply 1 application topically 2 (two) times daily., Disp: 30 g, Rfl: 0 .  XARELTO 15 MG TABS tablet, , Disp: , Rfl:  .  amiodarone (PACERONE) 200 MG tablet, , Disp: , Rfl:  .  ranitidine (ZANTAC) 150 MG tablet, TAKE 1 TABLET BY MOUTH AT  BEDTIME (Patient not taking: Reported on 11/20/2018), Disp: 90 tablet, Rfl: 3  Review of Systems   Constitutional: Negative for activity change and fatigue.  HENT: Negative.   Eyes: Negative.   Respiratory: Negative for cough, shortness of breath and wheezing.   Cardiovascular: Negative for chest pain, palpitations and leg swelling.  Gastrointestinal: Positive for abdominal distention.  Endocrine: Negative.   Musculoskeletal: Negative for arthralgias, joint swelling and myalgias.  Allergic/Immunologic: Negative for environmental allergies.  Neurological: Negative for dizziness, light-headedness and headaches.  Hematological: Negative.   Psychiatric/Behavioral: Negative for agitation, self-injury, sleep disturbance and suicidal ideas. The patient is not nervous/anxious.     Social History   Tobacco  Use  . Smoking status: Former Smoker    Packs/day: 3.00    Years: 20.00    Pack years: 60.00    Types: Cigarettes    Quit date: 07/25/1986    Years since quitting: 32.6  . Smokeless tobacco: Never Used  Substance Use Topics  . Alcohol use: No    Alcohol/week: 0.0 standard drinks      Objective:   BP (!) 138/58   Pulse 62 Comment: irregular  Resp 16   Wt 255 lb (115.7 kg)   SpO2 98%   BMI 37.66 kg/m  Vitals:   03/01/19 1542  BP: (!) 138/58  Pulse: 62  Resp: 16  SpO2: 98%  Weight: 255 lb (115.7 kg)  Body mass index is 37.66 kg/m.   Physical Exam Vitals signs reviewed.  Constitutional:      Appearance: He is well-developed. He is obese.  HENT:     Head: Normocephalic and atraumatic.     Right Ear: External ear normal.     Left Ear: External ear normal.     Nose: Nose normal.  Eyes:     General: No scleral icterus.    Conjunctiva/sclera: Conjunctivae normal.  Neck:     Thyroid: No thyromegaly.  Cardiovascular:     Rate and Rhythm: Normal rate and regular rhythm.     Heart sounds: Normal heart sounds.  Pulmonary:     Effort: Pulmonary effort is normal.     Breath sounds: Normal breath sounds.  Abdominal:     Palpations: Abdomen is soft.     Comments:  Trunk obesity  Musculoskeletal:     Right lower leg: No edema.     Left lower leg: No edema.  Skin:    General: Skin is warm and dry.  Neurological:     Mental Status: He is alert and oriented to person, place, and time. Mental status is at baseline.  Psychiatric:        Mood and Affect: Mood normal.        Behavior: Behavior normal.        Thought Content: Thought content normal.        Judgment: Judgment normal.      Results for orders placed or performed in visit on 03/01/19  POCT HgB A1C  Result Value Ref Range   Hemoglobin A1C 5.9 (A) 4.0 - 5.6 %   HbA1c POC (<> result, manual entry)     HbA1c, POC (prediabetic range)     HbA1c, POC (controlled diabetic range)     Est. average glucose Bld gHb Est-mCnc 123        Assessment & Plan    1. Diabetes mellitus with nephropathy ([place]) Controlled today with A1c of 5.9. - POCT HgB A1C  2. Coronary artery disease involving native coronary artery of native heart with unstable angina pectoris ([place]) Clinically stable.  All risk factors treated.More than 50% 25 minute visit spent in counseling or coordination of care   3. OSA (obstructive sleep apnea) On CPAP.  4. Atrial fibrillation, unspecified type ([place]) On Xarelto and amiodarone  5. Essential hypertension Controlled on amlodipine  6. Flu vaccine need  - Flu Vaccine QUAD High Dose(Fluad)     [name], MD  [facility]

## 2019-03-13 DIAGNOSIS — G4733 Obstructive sleep apnea (adult) (pediatric): Secondary | ICD-10-CM | POA: Diagnosis not present

## 2019-03-21 ENCOUNTER — Ambulatory Visit: Payer: Medicare Other | Admitting: Cardiovascular Disease

## 2019-03-21 ENCOUNTER — Encounter: Payer: Self-pay | Admitting: Cardiovascular Disease

## 2019-03-21 ENCOUNTER — Other Ambulatory Visit: Payer: Self-pay

## 2019-03-21 ENCOUNTER — Telehealth: Payer: Self-pay | Admitting: Family Medicine

## 2019-03-21 VITALS — BP 140/80 | HR 59 | Ht 70.0 in | Wt 253.0 lb

## 2019-03-21 DIAGNOSIS — I255 Ischemic cardiomyopathy: Secondary | ICD-10-CM

## 2019-03-21 DIAGNOSIS — I714 Abdominal aortic aneurysm, without rupture, unspecified: Secondary | ICD-10-CM

## 2019-03-21 DIAGNOSIS — E7800 Pure hypercholesterolemia, unspecified: Secondary | ICD-10-CM | POA: Diagnosis not present

## 2019-03-21 DIAGNOSIS — I5042 Chronic combined systolic (congestive) and diastolic (congestive) heart failure: Secondary | ICD-10-CM | POA: Diagnosis not present

## 2019-03-21 DIAGNOSIS — I10 Essential (primary) hypertension: Secondary | ICD-10-CM | POA: Diagnosis not present

## 2019-03-21 DIAGNOSIS — I2510 Atherosclerotic heart disease of native coronary artery without angina pectoris: Secondary | ICD-10-CM

## 2019-03-21 DIAGNOSIS — I480 Paroxysmal atrial fibrillation: Secondary | ICD-10-CM

## 2019-03-21 NOTE — Progress Notes (Signed)
Chief Complaint  Patient presents with  . Follow-up    CAD   History of Present Illness: 73 yo male with history of CAD s/p CABG in 1996 (LIMA-LAD, SVG-circumflex, SVG-RCA, SVG-diagonal) with redo bypass in 2007 (SVG-OM, SVG-RCA), ischemic cardiomyopathy, CKD, DM 2, HTN, HLD, renal artery stenosis, COPD, GERD and PVCs here today for cardiac follow up. Echo in 2014 with normal LV systolic function. He was hospitalized at [facility] July 2016 with a NSTEMI. Cardiac cath with severe disease in the SVG to OM treated with a drug eluting stent. He was treated with Brilinta post PCI but developed dyspnea and was changed to Plavix. He was admitted to [facility] May 2017 with unstable angina and cardiac cath showed severe disease in the distal body/anastomosis of the SVG to Diagonal.  A drug eluting stent was placed in this vein graft. The LIMA to LAD was patent, SVG to OM was patent, SVG to PDA/PLA was patent. He was admitted to [facility] February 2018 with chest pain. Troponin was negative x 3. He did not have an ischemic evaluation. Echo February 2018 with  mild LV systolic dysfunction. LVEF=45-50%. I saw him in the office in January 2019 and he was volume overloaded. Lasix was started. He was admitted to [facility] February 2019 with unstable angina and cardiac cath showed patency of all of the bypass grafts. Echo February 2019 with normal LV systolic function, FYBO=17-51%, grade 1 diastolic dysfunction. He was admitted to [facility] January 2020 with atrial fib/flutter with RVR. Started on Xarelto and initially rate controlled with IV Cardizem. He was loaded with IV amiodarone and discharged home on po amiodarone and Lopressor (Coreg stopped during admission). His Plavix was stopped and Norvasc were stopped at discharge. His Norvasc was restarted in primary care.   He is here today for follow up. The patient denies any chest pain, dyspnea, palpitations, lower extremity edema, orthopnea, PND, dizziness, near syncope or syncope. Daytime  somnolence. CPAP not working well and he has not been sleeping well.   Primary Care Physician: [name]., MD  Past Medical History:  Diagnosis Date  . AAA (abdominal aortic aneurysm) ([place])    a. 3cm by [place] 2015.  [name] Arthritis    "hips; back" (12/13/2014)  . CAD (coronary artery disease) 2007   a. s/p CABG- IMA-LAD, VG-Cx, VG-RCA, VG-diag in 1999. B. sp redo CABG- VG-OM, VG-RCA in 2007 due to VG disease. c. NSTEMI 11/2014 s/p DES to SVG-OM from the Y graft.d. PTCA/DES x 1 distal body of SVG to Diagonal.09/2015  . Chronic combined systolic and diastolic CHF (congestive heart failure) ([place])    a. remote EF 40-45% in 2006. b. Normal EF 2014. b. Echo 07/2016 EF 45-50%, grade 1 DD.  [name] Chronic lower back pain   . CKD (chronic kidney disease), stage IV ([place])   . COPD (chronic obstructive pulmonary disease) ([place])   . Deafness in left ear   . Degenerative disc disease, lumbar   . Dilated cardiomyopathy ([place]) 10/07/2015  . Emphysema   . Esophageal stricture 07/02/1998   EGD  . GERD (gastroesophageal reflux disease)   . History of gout    "last flareup was in 2007" (12/13/2014)  . History of hiatal hernia   . Hyperlipidemia   . Hypertension   . Ischemic cardiomyopathy 2006   EF 40% to 50% by 2D echo in 2006;  Echo 12/31/12: Mild LVH, EF 50-55%, normal wall motion.   [name] PVC's (premature ventricular contractions)   . Renal artery  stenosis ([place])    a. noted on CT 2008.  . Type II diabetes mellitus ([place])    Diet control   . Walking pneumonia 1990's    Past Surgical History:  Procedure Laterality Date  . CARDIAC CATHETERIZATION  "several"  . CARDIAC CATHETERIZATION N/A 12/13/2014   Procedure: Left Heart Cath and Coronary Angiography;  Surgeon: Jettie Booze, MD;  Location: [place] [facility];  Service: Cardiovascular;  Laterality: N/A;  . CARDIAC CATHETERIZATION  12/13/2014   Procedure: Coronary Stent Intervention;  Surgeon: Jettie Booze, MD;  Location: [place] [facility];   Service: Cardiovascular;;  . CARDIAC CATHETERIZATION N/A 10/07/2015   Procedure: Left Heart Cath and Cors/Grafts Angiography;  Surgeon: Burnell Blanks, MD;  Location: [place] [facility];  Service: Cardiovascular;  Laterality: N/A;  . CARDIAC CATHETERIZATION N/A 10/07/2015   Procedure: Coronary Stent Intervention;  Surgeon: Burnell Blanks, MD;  Location: [place] [facility];  Service: Cardiovascular;  Laterality: N/A;  . CORONARY ANGIOPLASTY  "several"  . CORONARY ANGIOPLASTY WITH STENT PLACEMENT  2005; 12/13/2014   "2; 1"  . CORONARY ARTERY BYPASS GRAFT  1996   CABG X5  . CORONARY ARTERY BYPASS GRAFT  March 2007   CABG X3  . ESOPHAGOGASTRODUODENOSCOPY (EGD) WITH ESOPHAGEAL DILATION  2000  . GREEN LIGHT LASER TURP (TRANSURETHRAL RESECTION OF PROSTATE  2000's   "not cancerous"  . HERNIA REPAIR    . LAPAROSCOPIC CHOLECYSTECTOMY    . LEFT HEART CATH AND CORS/GRAFTS ANGIOGRAPHY N/A 07/28/2017   Procedure: LEFT HEART CATH AND CORS/GRAFTS ANGIOGRAPHY;  Surgeon: Troy Sine, MD;  Location: [place] [facility];  Service: Cardiovascular;  Laterality: N/A;  . LUNG SURGERY  1996   "S/P CABG, had to put staple in lung after it had collapsed"  . UMBILICAL HERNIA REPAIR     w/chole    Current Outpatient Medications  Medication Sig Dispense Refill  . allopurinol (ZYLOPRIM) 100 MG tablet TAKE 1 TABLET BY MOUTH AT  BEDTIME 90 tablet 3  . amiodarone (PACERONE) 200 MG tablet TAKE 2 TABLETS BY MOUTH  TWICE DAILY 360 tablet 3  . amLODipine (NORVASC) 10 MG tablet Take 1 tablet (10 mg total) by mouth daily. 90 tablet 3  . aspirin EC 81 MG tablet Take 1 tablet (81 mg total) by mouth daily. 90 tablet 3  . atorvastatin (LIPITOR) 40 MG tablet TAKE 1 TABLET BY MOUTH  DAILY 90 tablet 2  . Cholecalciferol (VITAMIN D-3) 125 MCG (5000 UT) TABS Take 1 tablet by mouth daily.    . furosemide (LASIX) 40 MG tablet Take 40 mg by mouth daily as needed for fluid or edema.   6  . isosorbide mononitrate (IMDUR)  30 MG 24 hr tablet TAKE 1 TABLET BY MOUTH  DAILY 90 tablet 2  . loratadine (CLARITIN) 10 MG tablet Take 10 mg by mouth daily as needed for allergies.    . magnesium oxide (MAG-OX) 400 MG tablet TAKE 1 TABLET BY MOUTH TWICE A DAY 180 tablet 3  . Melatonin 5 MG TABS Take 1 tablet by mouth at bedtime.    . metoprolol tartrate (LOPRESSOR) 100 MG tablet Take 1 tablet (100 mg total) by mouth 2 (two) times daily. 180 tablet 3  . Multiple Vitamin (MULTIVITAMIN PO) Take 1 tablet by mouth daily.    . nitroGLYCERIN (NITROSTAT) 0.4 MG SL tablet Place 0.4 mg under the tongue every 5 (five) minutes as needed for chest pain (Up to 3 times).    [name]  pantoprazole (PROTONIX) 40 MG tablet Take 2 tablets (80 mg total) by mouth daily. 180 tablet 3  . Rivaroxaban (XARELTO) 15 MG TABS tablet Take 1 tablet (15 mg total) by mouth daily with supper. 30 tablet 11  . sodium bicarbonate 650 MG tablet Take 650 mg by mouth 2 (two) times daily.     No current facility-administered medications for this visit.     Allergies  Allergen Reactions  . Predicort [Prednisolone] Other (See Comments)    Stomach pain  . Ciprofloxacin     GI upset  . Hydrochlorothiazide Other (See Comments)    Dehydration  . Hydrocodone     Stomach upset  . Hydrocodone-Acetaminophen     Stomach upset  . Hydrocodone-Acetaminophen Nausea Only  . Loratadine Other (See Comments)    Other reaction(s): Unknown  . Sulfa Antibiotics Other (See Comments)    Cannot recall  . Sulfacetamide Sodium Other (See Comments)    Cannot recall  . Sulfasalazine     Other reaction(s): Other (See Comments) Cannot recall  . Hydrocodone-Acetaminophen Nausea Only  . Penicillins Hives and Rash    Has patient had a PCN reaction causing immediate rash, facial/tongue/throat swelling, SOB or lightheadedness with hypotension: YES Has patient had a PCN reaction causing severe rash involving mucus membranes or skin necrosis: NO Has patient had a PCN reaction that required  hospitalization NO Has patient had a PCN reaction occurring within the last 10 years:NO If all of the above answers are "NO", then may proceed with Cephalosporin use.    Social History   Socioeconomic History  . Marital status: Married    Spouse name: Not on file  . Number of children: 2  . Years of education: Not on file  . Highest education level: 8th grade  Occupational History  . Occupation: Retired  Scientific laboratory technician  . Financial resource strain: Not hard at all  . Food insecurity    Worry: Never true    Inability: Never true  . Transportation needs    Medical: No    Non-medical: No  Tobacco Use  . Smoking status: Former Smoker    Packs/day: 3.00    Years: 20.00    Pack years: 60.00    Types: Cigarettes    Quit date: 07/25/1986    Years since quitting: 32.6  . Smokeless tobacco: Never Used  Substance and Sexual Activity  . Alcohol use: No    Alcohol/week: 0.0 standard drinks  . Drug use: No  . Sexual activity: Not Currently  Lifestyle  . Physical activity    Days per week: 0 days    Minutes per session: 0 min  . Stress: Not at all  Relationships  . Social Herbalist on phone: Patient refused    Gets together: Patient refused    Attends religious service: Patient refused    Active member of club or organization: Patient refused    Attends meetings of clubs or organizations: Patient refused    Relationship status: Patient refused  . Intimate partner violence    Fear of current or ex partner: Patient refused    Emotionally abused: Patient refused    Physically abused: Patient refused    Forced sexual activity: Patient refused  Other Topics Concern  . Not on file  Social History Narrative   Did auto salvage work.   Lives at home with his wife.  Independent at baseline.    Family History  Problem Relation Age of Onset  . Heart  attack Mother        MI  . Stroke Mother   . Heart disease Mother   . Hypertension Mother   . Hyperlipidemia Mother    . Heart disease Father   . Rheumatic fever Father   . Colon cancer Neg Hx     Review of Systems:  As stated in the HPI and otherwise negative.   BP 140/80   Pulse (!) 59   Ht 5\' 10"  (1.778 m)   Wt 253 lb (114.8 kg)   SpO2 96%   BMI 36.30 kg/m   Physical Examination:  General: Well developed, well nourished, NAD  HEENT: OP clear, mucus membranes moist  SKIN: warm, dry. No rashes. Neuro: No focal deficits  Musculoskeletal: Muscle strength 5/5 all ext  Psychiatric: Mood and affect normal  Neck: No JVD, no carotid bruits, no thyromegaly, no lymphadenopathy.  Lungs:Clear bilaterally, no wheezes, rhonci, crackles Cardiovascular: Regular rate and rhythm. No murmurs, gallops or rubs. Abdomen:Soft. Bowel sounds present. Non-tender.  Extremities: No lower extremity edema. Pulses are 2 + in the bilateral DP/PT.  Echo 07/14/17: - Left ventricle: The cavity size was mildly dilated. Systolic   function was normal. The estimated ejection fraction was in the   range of 55% to 60%. Doppler parameters are consistent with   abnormal left ventricular relaxation (grade 1 diastolic   dysfunction).  Cardiac cath February 2019: Significant native CAD with total occlusion of the LAD after the takeoff of the first diagonal vessel; total occlusion of the proximal left circumflex coronary artery; and total occlusion of the proximal RCA with antegrade bridging collaterals. Patent LIMA to LAD. Patent Y vein graft from the 1996 surgery which supplies the diagonal vessel and distal circumflex marginal vessel.  There is diffuse narrowing of 50% in the midportion of the Y graft supplying the diagonal vessel and a patent distal stent extending to the ostium of the diagonal.  The Y graft supplying the distal marginal is free of significant disease and has mild luminal irregularity. Patent SVG supplying the distal RCA from the 2007 surgery with a stent in the proximal portion of the graft with 30% narrowings in  the proximal and mid segment. LVEDP 18 mm  Diagnostic Diagram        EKG:  EKG is not  ordered today. The ekg ordered today demonstrates  Recent Labs: 06/11/2018: TSH 2.491 06/12/2018: Magnesium 2.6 10/31/2018: ALT 25; BUN 37; Creatinine, Ser 2.84; Hemoglobin 13.7; Platelets 167; Potassium 5.5; Sodium 141   Lipid Panel    Component Value Date/Time   CHOL 115 11/22/2017 0931   TRIG 68 11/22/2017 0931   HDL 45 11/22/2017 0931   CHOLHDL 4.5 12/13/2014 0423   VLDL 25 12/13/2014 0423   LDLCALC 56 11/22/2017 0931     Wt Readings from Last 3 Encounters:  03/21/19 253 lb (114.8 kg)  03/01/19 255 lb (115.7 kg)  12/18/18 252 lb (114.3 kg)     Other studies Reviewed: Additional studies/ records that were reviewed today include: . Review of the above records demonstrates:    Assessment and Plan:   1. CAD s/p CABG with stable angina: Cardiac cath February 2019 with 4 patent vein grafts. No chest pain. Continue ASA, statin, beta blocker and Imdur.       2. Ischemic Cardiomyopathy: LVEF =35% by echo at [facility] during his admission with atrial fib. LVEF had been 45% last year. No Ace-inh or ARB due to CKD. Continue beta blocker  3. Hypertension: BP is  well controlled  4. Hyperlipidemia: Lipids followed in primary care. Continue statin  5. CKD: Followed by Nephrology. Ace-inh stopped due to renal insufficiency.   6. Chronic systolic CHF: Weight is stable. Continue Lasix as needed  7. PVCs/Bigeminy: No palpitations. Continue beta blocker   8. AAA: stable by u/s February 2019. Will repeat in 2021.   9. Atrial fibrillation, paroxysmal: He is in sinus today. Will continue Lopressor, amiodarone and Xarelto. CHADS VASC score 5. Will continue Lopressor, amiodarone and Xarelto.   10. Sleep apnea: he is on CPAP. He is not sleeping well and has daytime fatigue and somnolence. He is due back in the office to adjust his CPAP. I think this should help.     Current medicines are reviewed at  length with the patient today.  The patient does not have concerns regarding medicines.  The following changes have been made:  no change  Labs/ tests ordered today include:   No orders of the defined types were placed in this encounter.   Disposition:   FU with me in 3 months.    Signed, Lauree Chandler, MD 03/21/2019 5:01 PM    [facility] [place], [place], [place]  [address] Phone: [phone]; Fax: [phone]

## 2019-03-21 NOTE — Chronic Care Management (AMB) (Signed)
Chronic Care Management   Note  03/21/2019 Name: Gurdeep Keesey [facility] MRN: [id number] DOB: 07-27-1945  OSBORN PULLIN is a 73 y.o. year old male who is a primary care patient of [name]., MD. I reached out to [place] by phone today in response to a referral sent by Mr. Yitzchak Kothari [facility] health plan.     Mr. Holtsclaw was given information about Chronic Care Management services today including:  1. CCM service includes personalized support from designated clinical staff supervised by his physician, including individualized plan of care and coordination with other care providers 2. 24/7 contact phone numbers for assistance for urgent and routine care needs. 3. Service will only be billed when office clinical staff spend 20 minutes or more in a month to coordinate care. 4. Only one practitioner may furnish and bill the service in a calendar month. 5. The patient may stop CCM services at any time (effective at the end of the month) by phone call to the office staff. 6. The patient will be responsible for cost sharing (co-pay) of up to 20% of the service fee (after annual deductible is met).  Patient agreed to services and verbal consent obtained.   Follow up plan: Telephone appointment with CCM team member scheduled for: 03/26/2019  [place]  ??[email or web address]   ??[phone]

## 2019-03-21 NOTE — Patient Instructions (Signed)
Medication Instructions:  ?No changes ?*If you need a refill on your cardiac medications before your next appointment, please call your pharmacy* ? ? ?Lab Work: ?none ?If you have labs (blood work) drawn today and your tests are completely normal, you will receive your results only by: ?MyChart Message (if you have MyChart) OR ?A paper copy in the mail ?If you have any lab test that is abnormal or we need to change your treatment, we will call you to review the results. ? ? ?Testing/Procedures: ?none ? ? ?Follow-Up: ?At [facility], you and your health needs are our priority.  As part of our continuing mission to provide you with exceptional heart care, we have created designated Provider Care Teams.  These Care Teams include your primary Cardiologist (physician) and Advanced Practice Providers (APPs -  Physician Assistants and Nurse Practitioners) who all work together to provide you with the care you need, when you need it. ?  ? ?Your next appointment:   ?3 month(s) ? ?The format for your next appointment:   ?In Person ? ?Provider:   ?Christopher McAlhany, MD   ? ? ?Other Instructions ?  ?

## 2019-03-26 ENCOUNTER — Other Ambulatory Visit (HOSPITAL_COMMUNITY): Payer: Self-pay | Admitting: *Deleted

## 2019-03-26 ENCOUNTER — Ambulatory Visit (INDEPENDENT_AMBULATORY_CARE_PROVIDER_SITE_OTHER): Payer: Medicare Other | Admitting: Pharmacist

## 2019-03-26 DIAGNOSIS — I4891 Unspecified atrial fibrillation: Secondary | ICD-10-CM | POA: Diagnosis not present

## 2019-03-26 DIAGNOSIS — E1121 Type 2 diabetes mellitus with diabetic nephropathy: Secondary | ICD-10-CM

## 2019-03-26 DIAGNOSIS — N183 Chronic kidney disease, stage 3 unspecified: Secondary | ICD-10-CM

## 2019-03-26 DIAGNOSIS — I25110 Atherosclerotic heart disease of native coronary artery with unstable angina pectoris: Secondary | ICD-10-CM | POA: Diagnosis not present

## 2019-03-26 MED ORDER — AMIODARONE HCL 200 MG PO TABS: 200.0000 mg | ORAL_TABLET | Freq: Every day | ORAL | 2 refills | Status: DC

## 2019-03-28 NOTE — Chronic Care Management (AMB) (Signed)
Chronic Care Management   Note  03/28/2019- late entry Name: Ronald [name] MRN: [id number] DOB: 1945-08-11  Subjective:   Ronald [name] is a 73 year old male patient of Dr. Miguel Aschoff. Referred to CCM clinical pharmacy services by his health plan. Successful telephone outreach to [name] today (DPR on file). HIPAA identifiers verified.  Ronald [name] notes that Xarelto has become expensive but they will not qualify as all other generic medications are filled via [facility] mail order for $0 copay and therefore they will not meet minimum out of pocket requirement. If Eliquis goes generic in 2021, may be worth asking cardiology to switch to Eliquis.   Objective: Lab Results  Component Value Date   CREATININE 2.84 (H) 10/31/2018   CREATININE 2.52 (H) 06/14/2018   CREATININE 2.49 (H) 06/13/2018    Lab Results  Component Value Date   HGBA1C 5.9 (A) 03/01/2019    Lipid Panel     Component Value Date/Time   CHOL 115 11/22/2017 0931   TRIG 68 11/22/2017 0931   HDL 45 11/22/2017 0931   CHOLHDL 4.5 12/13/2014 0423   VLDL 25 12/13/2014 0423   LDLCALC 56 11/22/2017 0931    BP Readings from Last 3 Encounters:  03/21/19 140/80  03/01/19 (!) 138/58  12/18/18 (!) 151/71    Allergies  Allergen Reactions  . Predicort [Prednisolone] Other (See Comments)    Stomach pain  . Ciprofloxacin     GI upset  . Hydrochlorothiazide Other (See Comments)    Dehydration  . Hydrocodone     Stomach upset  . Hydrocodone-Acetaminophen     Stomach upset  . Hydrocodone-Acetaminophen Nausea Only  . Loratadine Other (See Comments)    Other reaction(s): Unknown  . Sulfa Antibiotics Other (See Comments)    Cannot recall  . Sulfacetamide Sodium Other (See Comments)    Cannot recall  . Sulfasalazine     Other reaction(s): Other (See Comments) Cannot recall  . Hydrocodone-Acetaminophen Nausea Only  . Penicillins Hives and Rash    Has patient had a PCN reaction causing immediate rash,  facial/tongue/throat swelling, SOB or lightheadedness with hypotension: YES Has patient had a PCN reaction causing severe rash involving mucus membranes or skin necrosis: NO Has patient had a PCN reaction that required hospitalization NO Has patient had a PCN reaction occurring within the last 10 years:NO If all of the above answers are "NO", then may proceed with Cephalosporin use.    Medications Reviewed Today    Reviewed by Cathi Roan, [facility] (Pharmacist) on 03/26/19 at [place] List Status: <None>  Medication Order Taking? Sig Documenting Provider Last Dose Status Informant  allopurinol (ZYLOPRIM) 100 MG tablet [id number] Yes TAKE 1 TABLET BY MOUTH AT  BEDTIME [name]., MD Taking Active   amiodarone (PACERONE) 200 MG tablet [id number] Yes TAKE 2 TABLETS BY MOUTH  TWICE DAILY [name]., MD Taking Active            Med Note [facility], Evelena Peat Mar 26, 2019  2:57 PM) Only taking one tablet once daily  amLODipine (NORVASC) 10 MG tablet [id number] Yes Take 1 tablet (10 mg total) by mouth daily. [name]., MD Taking Active   aspirin EC 81 MG tablet [id number] Yes Take 1 tablet (81 mg total) by mouth daily. Burnell Blanks, MD Taking Active   atorvastatin (LIPITOR) 40 MG tablet [id number] Yes TAKE 1 TABLET BY MOUTH  DAILY Burnell Blanks, MD  Taking Active   Cholecalciferol (VITAMIN D-3) 125 MCG (5000 UT) TABS [id number] Yes Take 1 tablet by mouth daily. [provider] Taking Active   furosemide (LASIX) 40 MG tablet [id number] No Take 40 mg by mouth daily as needed for fluid or edema.  [provider] Not Taking Active Spouse/Significant Other  isosorbide mononitrate (IMDUR) 30 MG 24 hr tablet [id number] Yes TAKE 1 TABLET BY MOUTH  DAILY Burnell Blanks, MD Taking Active   loratadine (CLARITIN) 10 MG tablet [id number] Yes Take 10 mg by mouth daily as needed for allergies. [provider] Taking Active  Spouse/Significant Other  magnesium oxide (MAG-OX) 400 MG tablet [id number] Yes TAKE 1 TABLET BY MOUTH TWICE A DAY  Patient taking differently: daily.    [name]., MD Taking Active Spouse/Significant Other  Melatonin 5 MG TABS [id number] Yes Take 1 tablet by mouth at bedtime. [provider] Taking Active   metoprolol tartrate (LOPRESSOR) 100 MG tablet [id number] Yes Take 1 tablet (100 mg total) by mouth 2 (two) times daily. Burnell Blanks, MD Taking Active   Multiple Vitamin (MULTIVITAMIN PO) [id number] Yes Take 1 tablet by mouth daily. [provider] Taking Active   nitroGLYCERIN (NITROSTAT) 0.4 MG SL tablet [id number] No Place 0.4 mg under the tongue every 5 (five) minutes as needed for chest pain (Up to 3 times). [provider] Not Taking Active Spouse/Significant Other  pantoprazole (PROTONIX) 40 MG tablet [id number] Yes Take 2 tablets (80 mg total) by mouth daily.  Patient taking differently: Take 40 mg by mouth 2 (two) times daily.    [name]., MD Taking Active   Rivaroxaban Alveda Reasons) 15 MG TABS tablet [id number] Yes Take 1 tablet (15 mg total) by mouth daily with supper. Burnell Blanks, MD Taking Active   sodium bicarbonate 650 MG tablet [id number] Yes Take 650 mg by mouth daily. Evening [provider] Taking Active Spouse/Significant Other           Assessment:    Goals Addressed            This Visit's Progress   . Polypharmacy (pt-stated)       Current Barriers:  . Polypharmacy; complex patient with multiple comorbidities including atrial fibrillation, DM, HTN . [name] manages medications   Pharmacist Clinical Goal(s):  [name] Over the next 30 days, patient will work with PharmD and provider towards optimized medication management  Interventions: . Comprehensive medication review performed; medication list updated in electronic medical record . Verify amiodarone dose with cardiology/Donna  Kayleen Memos NP (original prescriber) o Should be taking 200mg  daily  Patient Self Care Activities:  . Patient will take medications as prescribed   Initial goal documentation        Plan: Recommendations discussed with provider - verify amiodarone dose with cardiology   Recommendations discussed with patient - investigate cost savings for 2021 by switching from Xarelto to Eliquis  Follow up: Telephone follow up appointment with care management team member scheduled for: 1-2 weeks  Ruben Reason, PharmD Clinical Pharmacist [place] [phone]

## 2019-03-28 NOTE — Patient Instructions (Signed)
  Thank you allowing the Chronic Care Management Team to be a part of your care!    Goals Addressed            This Visit's Progress   . Polypharmacy (pt-stated)       Current Barriers:  . Polypharmacy; complex patient with multiple comorbidities including atrial fibrillation, DM, HTN . Wife [name] manages medications   Pharmacist Clinical Goal(s):  [name] Over the next 30 days, patient will work with PharmD and provider towards optimized medication management  Interventions: . Comprehensive medication review performed; medication list updated in electronic medical record . Verify amiodarone dose with cardiology/Donna Kayleen Memos NP (original prescriber) o Should be taking 200mg  daily  Patient Self Care Activities:  . Patient will take medications as prescribed   Initial goal documentation        Please call a member of the CCM (Chronic Care Management) Team with any questions or case management needs:   Neldon Labella, RN, BSN Nurse Care Coordinator  [phone]  Ruben Reason, PharmD  Clinical Pharmacist  [phone]  [place], LCSW Clinical Social Worker [phone]   The patient verbalized understanding of instructions provided today and declined a print copy of patient instruction materials.

## 2019-04-01 ENCOUNTER — Other Ambulatory Visit: Payer: Self-pay | Admitting: Family Medicine

## 2019-04-02 ENCOUNTER — Ambulatory Visit: Payer: Self-pay | Admitting: Pharmacist

## 2019-04-02 DIAGNOSIS — I25110 Atherosclerotic heart disease of native coronary artery with unstable angina pectoris: Secondary | ICD-10-CM

## 2019-04-02 DIAGNOSIS — N183 Chronic kidney disease, stage 3 unspecified: Secondary | ICD-10-CM

## 2019-04-02 DIAGNOSIS — I4891 Unspecified atrial fibrillation: Secondary | ICD-10-CM

## 2019-04-02 NOTE — Chronic Care Management (AMB) (Addendum)
  Chronic Care Management   Note  04/02/2019 Name: Ronald [name] [facility] MRN: [id number] DOB: 1945/08/05  Mrs. Ronald [name] returning CCM pharmacist's earlier call. HIPAA identifiers verified.   Reviewed correct dose for amiodarone: amiodarone 200mg , one tablet daily; medication list in EMR has been updated to reflect this dosing  Goals Addressed            This Visit's Progress   . COMPLETED: Polypharmacy (pt-stated)       Current Barriers:  . Polypharmacy; complex patient with multiple comorbidities including atrial fibrillation, DM, HTN . [name] manages medications   Pharmacist Clinical Goal(s):  [name] Over the next 30 days, patient will work with PharmD and provider towards optimized medication management  Interventions: . Comprehensive medication review performed; medication list updated in electronic medical record . Verify amiodarone dose with cardiology/Ronald Kayleen Memos NP (original prescriber) o Should be taking 200mg  daily  Patient Self Care Activities:  . Patient will take medications as prescribed   Initial goal documentation          Follow up plan: The patient has been provided with contact information for the care management team and has been advised to call with any health related questions or concerns.   Ronald [name], PharmD Clinical Pharmacist [place] [phone]

## 2019-04-02 NOTE — Patient Instructions (Signed)
Congratulations! You have met all case management goals! You may call the case management team at any time should you have a question or if you have new case management needs. We are happy to help you! We will let your doctor know that you have met your goals.    Thank you allowing the Chronic Care Management Team to be a part of your care!   Please call a member of the CCM (Chronic Care Management) Team with any questions or case management needs in the future:   Neldon Labella, RN, BSN Nurse Care Coordinator  [phone]  Ruben Reason, PharmD  Clinical Pharmacist  [phone]  [place], [place] [phone]  Goals Addressed            This Visit's Progress   . COMPLETED: Polypharmacy (pt-stated)       Current Barriers:  . Polypharmacy; complex patient with multiple comorbidities including atrial fibrillation, DM, HTN . Wife [name] manages medications   Pharmacist Clinical Goal(s):  [name] Over the next 30 days, patient will work with PharmD and provider towards optimized medication management  Interventions: . Comprehensive medication review performed; medication list updated in electronic medical record . Verify amiodarone dose with cardiology/Donna Kayleen Memos NP (original prescriber) o Should be taking 267m daily  Patient Self Care Activities:  . Patient will take medications as prescribed   Initial goal documentation

## 2019-04-13 DIAGNOSIS — G4733 Obstructive sleep apnea (adult) (pediatric): Secondary | ICD-10-CM | POA: Diagnosis not present

## 2019-04-18 NOTE — Progress Notes (Signed)
Subjective:   Ronald [name] is a 73 y.o. male who presents for Medicare Annual/Subsequent preventive examination.    This visit is being conducted through telemedicine due to the COVID-19 pandemic. This patient has given me verbal consent via doximity to conduct this visit, patient states they are participating from their home address. Some vital signs may be absent or patient reported.    Patient identification: identified by name, DOB, and current address  Review of Systems:  N/A  Cardiac Risk Factors include: advanced age (>53men, >40 women);dyslipidemia;male gender;hypertension;diabetes mellitus;obesity (BMI >30kg/m2)     Objective:    Vitals: There were no vitals taken for this visit.  There is no height or weight on file to calculate BMI. Unable to obtain vitals due to visit being conducted via telephonically.   Advanced Directives 04/19/2019 06/10/2018 04/17/2018 07/27/2017 07/08/2017 01/21/2017 01/11/2017  Does Patient Have a Medical Advance Directive? No No Yes No No No No  Type of Advance Directive - - Living will - - - -  Does patient want to make changes to medical advance directive? - - - - - Yes (ED - Information included in AVS) -  Would patient like information on creating a medical advance directive? No - Patient declined No - Patient declined - No - Patient declined - - -  Pre-existing out of facility DNR order (yellow form or pink MOST form) - - - - - - -    Tobacco Social History   Tobacco Use  Smoking Status Former Smoker  . Packs/day: 3.00  . Years: 20.00  . Pack years: 60.00  . Types: Cigarettes  . Quit date: 07/25/1986  . Years since quitting: 32.7  Smokeless Tobacco Never Used     Counseling given: Not Answered   Clinical Intake:  Pre-visit preparation completed: Yes  Pain Score: 0-No pain     Nutritional Risks: None Diabetes: Yes  How often do you need to have someone help you when you read instructions, pamphlets, or other written  materials from your doctor or pharmacy?: 1 - Never    Diabetes:  Is the patient diabetic?  Yes type 2 If diabetic, was a CBG obtained today?  No  Did the patient bring in their glucometer from home?  No  How often do you monitor your CBG's? Does not check regularly.   Financial Strains and Diabetes Management:  Are you having any financial strains with the device, your supplies or your medication? No .  Does the patient want to be seen by Chronic Care Management for management of their diabetes?  No  Would the patient like to be referred to a Nutritionist or for Diabetic Management?  No   Diabetic Exams:  Diabetic Eye Exam: Completed 12/12/18. Repeat yearly.   Diabetic Foot Exam: Completed 01/11/17. Pt has been advised about the importance in completing this exam.    Interpreter Needed?: No  Information entered by :: [facility], LPN  Past Medical History:  Diagnosis Date  . AAA (abdominal aortic aneurysm) ([place])    a. 3cm by [place] 2015.  [name] Arthritis    "hips; back" (12/13/2014)  . CAD (coronary artery disease) 2007   a. s/p CABG- IMA-LAD, VG-Cx, VG-RCA, VG-diag in 1999. B. sp redo CABG- VG-OM, VG-RCA in 2007 due to VG disease. c. NSTEMI 11/2014 s/p DES to SVG-OM from the Y graft.d. PTCA/DES x 1 distal body of SVG to Diagonal.09/2015  . Chronic combined systolic and diastolic CHF (congestive heart failure) ([place])  a. remote EF 40-45% in 2006. b. Normal EF 2014. b. Echo 07/2016 EF 45-50%, grade 1 DD.  [name] Chronic lower back pain   . CKD (chronic kidney disease), stage IV ([place])   . COPD (chronic obstructive pulmonary disease) ([place])   . Deafness in left ear   . Degenerative disc disease, lumbar   . Dilated cardiomyopathy ([place]) 10/07/2015  . Emphysema   . Esophageal stricture 07/02/1998   EGD  . GERD (gastroesophageal reflux disease)   . History of gout    "last flareup was in 2007" (12/13/2014)  . History of hiatal hernia   . Hyperlipidemia   . Hypertension   . Ischemic cardiomyopathy  2006   EF 40% to 50% by 2D echo in 2006;  Echo 12/31/12: Mild LVH, EF 50-55%, normal wall motion.   [name] PVC's (premature ventricular contractions)   . Renal artery stenosis ([place])    a. noted on CT 2008.  . Type II diabetes mellitus ([place])    Diet control   . Walking pneumonia 1990's   Past Surgical History:  Procedure Laterality Date  . CARDIAC CATHETERIZATION  "several"  . CARDIAC CATHETERIZATION N/A 12/13/2014   Procedure: Left Heart Cath and Coronary Angiography;  Surgeon: Jettie Booze, MD;  Location: [place] [facility];  Service: Cardiovascular;  Laterality: N/A;  . CARDIAC CATHETERIZATION  12/13/2014   Procedure: Coronary Stent Intervention;  Surgeon: Jettie Booze, MD;  Location: [place] [facility];  Service: Cardiovascular;;  . CARDIAC CATHETERIZATION N/A 10/07/2015   Procedure: Left Heart Cath and Cors/Grafts Angiography;  Surgeon: Burnell Blanks, MD;  Location: [place] [facility];  Service: Cardiovascular;  Laterality: N/A;  . CARDIAC CATHETERIZATION N/A 10/07/2015   Procedure: Coronary Stent Intervention;  Surgeon: Burnell Blanks, MD;  Location: [place] [facility];  Service: Cardiovascular;  Laterality: N/A;  . CORONARY ANGIOPLASTY  "several"  . CORONARY ANGIOPLASTY WITH STENT PLACEMENT  2005; 12/13/2014   "2; 1"  . CORONARY ARTERY BYPASS GRAFT  1996   CABG X5  . CORONARY ARTERY BYPASS GRAFT  March 2007   CABG X3  . ESOPHAGOGASTRODUODENOSCOPY (EGD) WITH ESOPHAGEAL DILATION  2000  . GREEN LIGHT LASER TURP (TRANSURETHRAL RESECTION OF PROSTATE  2000's   "not cancerous"  . HERNIA REPAIR    . LAPAROSCOPIC CHOLECYSTECTOMY    . LEFT HEART CATH AND CORS/GRAFTS ANGIOGRAPHY N/A 07/28/2017   Procedure: LEFT HEART CATH AND CORS/GRAFTS ANGIOGRAPHY;  Surgeon: Troy Sine, MD;  Location: [place] [facility];  Service: Cardiovascular;  Laterality: N/A;  . LUNG SURGERY  1996   "S/P CABG, had to put staple in lung after it had collapsed"  . UMBILICAL HERNIA REPAIR      w/chole   Family History  Problem Relation Age of Onset  . Heart attack Mother        MI  . Stroke Mother   . Heart disease Mother   . Hypertension Mother   . Hyperlipidemia Mother   . Heart disease Father   . Rheumatic fever Father   . Colon cancer Neg Hx    Social History   Socioeconomic History  . Marital status: Married    Spouse name: Not on file  . Number of children: 2  . Years of education: Not on file  . Highest education level: 8th grade  Occupational History  . Occupation: Retired  Scientific laboratory technician  . Financial resource strain: Not hard at all  . Food insecurity    Worry: Never  true    Inability: Never true  . Transportation needs    Medical: No    Non-medical: No  Tobacco Use  . Smoking status: Former Smoker    Packs/day: 3.00    Years: 20.00    Pack years: 60.00    Types: Cigarettes    Quit date: 07/25/1986    Years since quitting: 32.7  . Smokeless tobacco: Never Used  Substance and Sexual Activity  . Alcohol use: No    Alcohol/week: 0.0 standard drinks  . Drug use: No  . Sexual activity: Not Currently  Lifestyle  . Physical activity    Days per week: 0 days    Minutes per session: 0 min  . Stress: Not at all  Relationships  . Social Herbalist on phone: Patient refused    Gets together: Patient refused    Attends religious service: Patient refused    Active member of club or organization: Patient refused    Attends meetings of clubs or organizations: Patient refused    Relationship status: Patient refused  Other Topics Concern  . Not on file  Social History Narrative   Did auto salvage work.   Lives at home with his wife.  Independent at baseline.    Outpatient Encounter Medications as of 04/19/2019  Medication Sig  . allopurinol (ZYLOPRIM) 100 MG tablet TAKE 1 TABLET BY MOUTH AT  BEDTIME  . amiodarone (PACERONE) 200 MG tablet Take 1 tablet (200 mg total) by mouth daily.  [name] amLODipine (NORVASC) 10 MG tablet Take 1 tablet (10 mg  total) by mouth daily.  [name] aspirin EC 81 MG tablet Take 1 tablet (81 mg total) by mouth daily.  [name] atorvastatin (LIPITOR) 40 MG tablet TAKE 1 TABLET BY MOUTH  DAILY  . Cholecalciferol (VITAMIN D-3) 125 MCG (5000 UT) TABS Take 1 tablet by mouth daily.  . furosemide (LASIX) 40 MG tablet Take 40 mg by mouth daily as needed for fluid or edema.   . isosorbide mononitrate (IMDUR) 30 MG 24 hr tablet TAKE 1 TABLET BY MOUTH  DAILY  . loratadine (CLARITIN) 10 MG tablet Take 10 mg by mouth daily as needed for allergies.  . magnesium oxide (MAG-OX) 400 MG tablet TAKE 1 TABLET BY MOUTH TWICE A DAY  . Melatonin 5 MG TABS Take 1 tablet by mouth at bedtime.  . metoprolol tartrate (LOPRESSOR) 100 MG tablet Take 1 tablet (100 mg total) by mouth 2 (two) times daily.  . Multiple Vitamin (MULTIVITAMIN PO) Take 1 tablet by mouth daily.  . nitroGLYCERIN (NITROSTAT) 0.4 MG SL tablet Place 0.4 mg under the tongue every 5 (five) minutes as needed for chest pain (Up to 3 times).  . pantoprazole (PROTONIX) 40 MG tablet Take 2 tablets (80 mg total) by mouth daily. (Patient taking differently: Take 40 mg by mouth 2 (two) times daily. )  . Rivaroxaban (XARELTO) 15 MG TABS tablet Take 1 tablet (15 mg total) by mouth daily with supper.  . sodium bicarbonate 650 MG tablet Take 650 mg by mouth 2 (two) times daily. Evening   No facility-administered encounter medications on file as of 04/19/2019.     Activities of Daily Living In your present state of health, do you have any difficulty performing the following activities: 04/19/2019 06/10/2018  Hearing? Y Y  Comment Deaf in left ear. -  Vision? N N  Difficulty concentrating or making decisions? N N  Walking or climbing stairs? N N  Dressing or bathing? N N  Doing errands, shopping? N N  Preparing Food and eating ? N -  Using the Toilet? N -  In the past six months, have you accidently leaked urine? Y -  Comment Occasionally due to previous last tx. -  Do you have problems  with loss of bowel control? N -  Managing your Medications? N -  Managing your Finances? N -  Housekeeping or managing your Housekeeping? N -  Some recent data might be hidden    Patient Care Team: [name]., MD as PCP - General (Unknown Physician Specialty) Burnell Blanks, MD as PCP - Cardiology (Cardiology) Murlean Iba, MD as Consulting Physician (Internal Medicine) Cathi Roan, [facility] (Pharmacist) Eulogio Bear, MD as Consulting Physician (Ophthalmology) Margaretha Sheffield, MD (Otolaryngology)   Assessment:   This is a routine wellness examination for [place].  Exercise Activities and Dietary recommendations Current Exercise Habits: The patient does not participate in regular exercise at present, Exercise limited by: None identified  Goals    . Cut out extra servings     Recommend to cut out all snacks and sweets after dinner to help aid in weight loss.     . Increase water intake     Starting 04/16/16, I will increase my water intake to 4-5 glasses a day.       Fall Risk Fall Risk  04/19/2019 12/18/2018 04/17/2018 01/23/2018 01/21/2017  Falls in the past year? 0 0 0 No No  Number falls in past yr: 0 - - - -  Injury with Fall? 0 - - - -  Risk for fall due to : - - - - -  Risk for fall due to: Comment - - - - -   FALL RISK PREVENTION PERTAINING TO THE HOME:  Any stairs in or around the home? Yes  If so, are there any without handrails? Yes   Home free of loose throw rugs in walkways, pet beds, electrical cords, etc? Yes  Adequate lighting in your home to reduce risk of falls? Yes   ASSISTIVE DEVICES UTILIZED TO PREVENT FALLS:  Life alert? No  Use of a cane, walker or w/c? No  Grab bars in the bathroom? No  Shower chair or bench in shower? Yes  Elevated toilet seat or a handicapped toilet? No    TIMED UP AND GO:  Was the test performed? No .    Depression Screen PHQ 2/9 Scores 04/19/2019 04/17/2018 01/23/2018 01/21/2017  PHQ - 2 Score  0 0 0 0  PHQ- 9 Score - - - -  Exception Documentation - - - -    Cognitive Function: Declined today.      6CIT Screen 04/17/2018 04/16/2016  What Year? 0 points 0 points  What month? 0 points 0 points  What time? 0 points 0 points  Count back from 20 0 points 0 points  Months in reverse 4 points 4 points  Repeat phrase 2 points 10 points  Total Score 6 14    Immunization History  Administered Date(s) Administered  . Fluad Quad(high Dose 65+) 03/01/2019  . Influenza, High Dose Seasonal PF 03/18/2015, 03/02/2016, 02/13/2018  . Influenza-Unspecified 03/14/2017  . Pneumococcal Conjugate-13 12/25/2013  . Pneumococcal Polysaccharide-23 03/04/2009  . Td 10/10/2003, 04/16/2016    Qualifies for Shingles Vaccine? Yes . Due for Shingrix. Pt has been advised to call insurance company to determine out of pocket expense. Advised may also receive vaccine at local pharmacy or Health Dept. Verbalized acceptance and understanding.  Tdap: Up to date  Flu Vaccine: Up to date  Pneumococcal Vaccine: Up to date  Screening Tests Health Maintenance  Topic Date Due  . FOOT EXAM  01/11/2018  . URINE MICROALBUMIN  01/11/2018  . HEMOGLOBIN A1C  08/30/2019  . COLONOSCOPY  11/19/2019  . OPHTHALMOLOGY EXAM  12/12/2019  . TETANUS/TDAP  04/16/2026  . INFLUENZA VACCINE  Completed  . Hepatitis C Screening  Completed  . PNA vac Low Risk Adult  Completed   Cancer Screenings:  Colorectal Screening: Completed 11/18/16. Repeat every 3 years.  Lung Cancer Screening: (Low Dose CT Chest recommended if Age 28-80 years, 30 pack-year currently smoking OR have quit w/in 15years.) does not qualify.   Additional Screening:  Hepatitis C Screening: Up to date  Dental Screening: Recommended annual dental exams for proper oral hygiene  Community Resource Referral:  CRR required this visit?  No        Plan:  I have personally reviewed and addressed the Medicare Annual Wellness questionnaire and have  noted the following in the patient's chart:  A. Medical and social history B. Use of alcohol, tobacco or illicit drugs  C. Current medications and supplements D. Functional ability and status E.  Nutritional status F.  Physical activity G. Advance directives H. List of other physicians I.  Hospitalizations, surgeries, and ER visits in previous 12 months J.  [place] such as hearing and vision if needed, cognitive and depression L. Referrals and appointments   In addition, I have reviewed and discussed with patient certain preventive protocols, quality metrics, and best practice recommendations. A written personalized care plan for preventive services as well as general preventive health recommendations were provided to patient.   [place] Score, [place]  [id number] Nurse Health Advisor  Nurse Notes: Pt needs a diabetic foot exam and urine check at next in office apt.

## 2019-04-19 ENCOUNTER — Ambulatory Visit (INDEPENDENT_AMBULATORY_CARE_PROVIDER_SITE_OTHER): Payer: Medicare Other

## 2019-04-19 ENCOUNTER — Other Ambulatory Visit: Payer: Self-pay

## 2019-04-19 DIAGNOSIS — Z0000 Encounter for general adult medical examination without abnormal findings: Secondary | ICD-10-CM

## 2019-04-19 NOTE — Patient Instructions (Signed)
[name] , Thank you for taking time to come for your Medicare Wellness Visit. I appreciate your ongoing commitment to your health goals. Please review the following plan we discussed and let me know if I can assist you in the future.   Screening recommendations/referrals: Colonoscopy: Up to date, due 10/2019 Recommended yearly ophthalmology/optometry visit for glaucoma screening and checkup Recommended yearly dental visit for hygiene and checkup  Vaccinations: Influenza vaccine: Up to date Pneumococcal vaccine: Completed series Tdap vaccine: Up to date, due 03/2026 Shingles vaccine: Pt declines today.     Advanced directives: Advance directive discussed with you today. Even though you declined this today please call our office should you change your mind and we can give you the proper paperwork for you to fill out.  Conditions/risks identified: Recommend to cut back on junk food and sweets and focus on healthier snacks such as fruits and vegetables.   Next appointment: 04/30/19 @ 2:40 PM with Dr Ronald [name].   Preventive Care 30 Years and Older, Male Preventive care refers to lifestyle choices and visits with your health care provider that can promote health and wellness. What does preventive care include?  A yearly physical exam. This is also called an annual well check.  Dental exams once or twice a year.  Routine eye exams. Ask your health care provider how often you should have your eyes checked.  Personal lifestyle choices, including:  Daily care of your teeth and gums.  Regular physical activity.  Eating a healthy diet.  Avoiding tobacco and drug use.  Limiting alcohol use.  Practicing safe sex.  Taking low doses of aspirin every day.  Taking vitamin and mineral supplements as recommended by your health care provider. What happens during an annual well check? The services and screenings done by your health care provider during your annual well check will depend on  your age, overall health, lifestyle risk factors, and family history of disease. Counseling  Your health care provider may ask you questions about your:  Alcohol use.  Tobacco use.  Drug use.  Emotional well-being.  Home and relationship well-being.  Sexual activity.  Eating habits.  History of falls.  Memory and ability to understand (cognition).  Work and work Statistician. Screening  You may have the following tests or measurements:  Height, weight, and BMI.  Blood pressure.  Lipid and cholesterol levels. These may be checked every 5 years, or more frequently if you are over 72 years old.  Skin check.  Lung cancer screening. You may have this screening every year starting at age 53 if you have a 30-pack-year history of smoking and currently smoke or have quit within the past 15 years.  Fecal occult blood test (FOBT) of the stool. You may have this test every year starting at age 22.  Flexible sigmoidoscopy or colonoscopy. You may have a sigmoidoscopy every 5 years or a colonoscopy every 10 years starting at age 34.  Prostate cancer screening. Recommendations will vary depending on your family history and other risks.  Hepatitis C blood test.  Hepatitis B blood test.  Sexually transmitted disease (STD) testing.  Diabetes screening. This is done by checking your blood sugar (glucose) after you have not eaten for a while (fasting). You may have this done every 1-3 years.  Abdominal aortic aneurysm (AAA) screening. You may need this if you are a current or former smoker.  Osteoporosis. You may be screened starting at age 29 if you are at high risk. Talk with your  health care provider about your test results, treatment options, and if necessary, the need for more tests. Vaccines  Your health care provider may recommend certain vaccines, such as:  Influenza vaccine. This is recommended every year.  Tetanus, diphtheria, and acellular pertussis (Tdap, Td) vaccine.  You may need a Td booster every 10 years.  Zoster vaccine. You may need this after age 26.  Pneumococcal 13-valent conjugate (PCV13) vaccine. One dose is recommended after age 63.  Pneumococcal polysaccharide (PPSV23) vaccine. One dose is recommended after age 81. Talk to your health care provider about which screenings and vaccines you need and how often you need them. This information is not intended to replace advice given to you by your health care provider. Make sure you discuss any questions you have with your health care provider. Document Released: 06/13/2015 Document Revised: 02/04/2016 Document Reviewed: 03/18/2015 Elsevier Interactive Patient Education  2017 [place] Prevention in the Home Falls can cause injuries. They can happen to people of all ages. There are many things you can do to make your home safe and to help prevent falls. What can I do on the outside of my home?  Regularly fix the edges of walkways and driveways and fix any cracks.  Remove anything that might make you trip as you walk through a door, such as a raised step or threshold.  Trim any bushes or trees on the path to your home.  Use bright outdoor lighting.  Clear any walking paths of anything that might make someone trip, such as rocks or tools.  Regularly check to see if handrails are loose or broken. Make sure that both sides of any steps have handrails.  Any raised decks and porches should have guardrails on the edges.  Have any leaves, snow, or ice cleared regularly.  Use sand or salt on walking paths during winter.  Clean up any spills in your garage right away. This includes oil or grease spills. What can I do in the bathroom?  Use night lights.  Install grab bars by the toilet and in the tub and shower. Do not use towel bars as grab bars.  Use non-skid mats or decals in the tub or shower.  If you need to sit down in the shower, use a plastic, non-slip stool.  Keep the  floor dry. Clean up any water that spills on the floor as soon as it happens.  Remove soap buildup in the tub or shower regularly.  Attach bath mats securely with double-sided non-slip rug tape.  Do not have throw rugs and other things on the floor that can make you trip. What can I do in the bedroom?  Use night lights.  Make sure that you have a light by your bed that is easy to reach.  Do not use any sheets or blankets that are too big for your bed. They should not hang down onto the floor.  Have a firm chair that has side arms. You can use this for support while you get dressed.  Do not have throw rugs and other things on the floor that can make you trip. What can I do in the kitchen?  Clean up any spills right away.  Avoid walking on wet floors.  Keep items that you use a lot in easy-to-reach places.  If you need to reach something above you, use a strong step stool that has a grab bar.  Keep electrical cords out of the way.  Do not use  floor polish or wax that makes floors slippery. If you must use wax, use non-skid floor wax.  Do not have throw rugs and other things on the floor that can make you trip. What can I do with my stairs?  Do not leave any items on the stairs.  Make sure that there are handrails on both sides of the stairs and use them. Fix handrails that are broken or loose. Make sure that handrails are as long as the stairways.  Check any carpeting to make sure that it is firmly attached to the stairs. Fix any carpet that is loose or worn.  Avoid having throw rugs at the top or bottom of the stairs. If you do have throw rugs, attach them to the floor with carpet tape.  Make sure that you have a light switch at the top of the stairs and the bottom of the stairs. If you do not have them, ask someone to add them for you. What else can I do to help prevent falls?  Wear shoes that:  Do not have high heels.  Have rubber bottoms.  Are comfortable and fit  you well.  Are closed at the toe. Do not wear sandals.  If you use a stepladder:  Make sure that it is fully opened. Do not climb a closed stepladder.  Make sure that both sides of the stepladder are locked into place.  Ask someone to hold it for you, if possible.  Clearly mark and make sure that you can see:  Any grab bars or handrails.  First and last steps.  Where the edge of each step is.  Use tools that help you move around (mobility aids) if they are needed. These include:  Canes.  Walkers.  Scooters.  Crutches.  Turn on the lights when you go into a dark area. Replace any light bulbs as soon as they burn out.  Set up your furniture so you have a clear path. Avoid moving your furniture around.  If any of your floors are uneven, fix them.  If there are any pets around you, be aware of where they are.  Review your medicines with your doctor. Some medicines can make you feel dizzy. This can increase your chance of falling. Ask your doctor what other things that you can do to help prevent falls. This information is not intended to replace advice given to you by your health care provider. Make sure you discuss any questions you have with your health care provider. Document Released: 03/13/2009 Document Revised: 10/23/2015 Document Reviewed: 06/21/2014 Elsevier Interactive Patient Education  2017 [employer].

## 2019-04-23 DIAGNOSIS — I4891 Unspecified atrial fibrillation: Secondary | ICD-10-CM | POA: Diagnosis not present

## 2019-04-23 DIAGNOSIS — I10 Essential (primary) hypertension: Secondary | ICD-10-CM | POA: Diagnosis not present

## 2019-04-23 DIAGNOSIS — G4733 Obstructive sleep apnea (adult) (pediatric): Secondary | ICD-10-CM | POA: Diagnosis not present

## 2019-04-24 NOTE — Progress Notes (Deleted)
{Method of visit:23308}    Patient: Ronald [name], Male    DOB: 29-Nov-1945, 73 y.o.   MRN: [id number] Visit Date: 04/30/2019  Today's Provider: [name], MD   No chief complaint on file.  Subjective:     Patient had AWV with [facility] on 04/19/2019.   Complete Physical Ronald [name] is a 73 y.o. male. He feels fairly well. He reports he is not  Exercising due to hip pain. Ronald [name] He reports he is sleeping fairly well.   Subjective:   Ronald [name] is a 73 y.o. male who presents for Medicare Annual/Subsequent preventive examination.    This visit is being conducted through telemedicine due to the COVID-19 pandemic. This patient has given me verbal consent via doximity to conduct this visit, patient states they are participating from their home address. Some vital signs may be absent or patient reported.    Patient identification: identified by name, DOB, and current address  Review of Systems:  N/A        Objective:    Vitals: There were no vitals taken for this visit.  There is no height or weight on file to calculate BMI. Unable to obtain vitals due to visit being conducted via telephonically.   Advanced Directives 04/19/2019 06/10/2018 04/17/2018 07/27/2017 07/08/2017 01/21/2017 01/11/2017  Does Patient Have a Medical Advance Directive? No No Yes No No No No  Type of Advance Directive - - Living will - - - -  Does patient want to make changes to medical advance directive? - - - - - Yes (ED - Information included in AVS) -  Would patient like information on creating a medical advance directive? No - Patient declined No - Patient declined - No - Patient declined - - -  Pre-existing out of facility DNR order (yellow form or pink MOST form) - - - - - - -    Tobacco Social History   Tobacco Use  Smoking Status Former Smoker  . Packs/day: 3.00  . Years: 20.00  . Pack years: 60.00  . Types: Cigarettes  . Quit date: 07/25/1986  . Years since quitting: 32.7  Smokeless  Tobacco Never Used     Counseling given: Not Answered   Clinical Intake:                   Diabetes:  Is the patient diabetic?  Yes type 2 If diabetic, was a CBG obtained today?  No  Did the patient bring in their glucometer from home?  No  How often do you monitor your CBG's? Does not check regularly.   Financial Strains and Diabetes Management:  Are you having any financial strains with the device, your supplies or your medication? No .  Does the patient want to be seen by Chronic Care Management for management of their diabetes?  No  Would the patient like to be referred to a Nutritionist or for Diabetic Management?  No   Diabetic Exams:  Diabetic Eye Exam: Completed 12/12/18. Repeat yearly.   Diabetic Foot Exam: Completed 01/11/17. Pt has been advised about the importance in completing this exam.          Past Medical History:  Diagnosis Date  . AAA (abdominal aortic aneurysm) ([place])    a. 3cm by [place] 2015.  Ronald [name] Arthritis    "hips; back" (12/13/2014)  . CAD (coronary artery disease) 2007   a. s/p CABG- IMA-LAD, VG-Cx, VG-RCA, VG-diag in 1999. B. sp redo CABG- VG-OM,  VG-RCA in 2007 due to VG disease. c. NSTEMI 11/2014 s/p DES to SVG-OM from the Y graft.d. PTCA/DES x 1 distal body of SVG to Diagonal.09/2015  . Chronic combined systolic and diastolic CHF (congestive heart failure) ([place])    a. remote EF 40-45% in 2006. b. Normal EF 2014. b. Echo 07/2016 EF 45-50%, grade 1 DD.  Ronald [name] Chronic lower back pain   . CKD (chronic kidney disease), stage IV ([place])   . COPD (chronic obstructive pulmonary disease) ([place])   . Deafness in left ear   . Degenerative disc disease, lumbar   . Dilated cardiomyopathy ([place]) 10/07/2015  . Emphysema   . Esophageal stricture 07/02/1998   EGD  . GERD (gastroesophageal reflux disease)   . History of gout    "last flareup was in 2007" (12/13/2014)  . History of hiatal hernia   . Hyperlipidemia   . Hypertension   . Ischemic cardiomyopathy 2006   EF  40% to 50% by 2D echo in 2006;  Echo 12/31/12: Mild LVH, EF 50-55%, normal wall motion.   Ronald [name] PVC's (premature ventricular contractions)   . Renal artery stenosis ([place])    a. noted on CT 2008.  . Type II diabetes mellitus ([place])    Diet control   . Walking pneumonia 1990's   Past Surgical History:  Procedure Laterality Date  . CARDIAC CATHETERIZATION  "several"  . CARDIAC CATHETERIZATION N/A 12/13/2014   Procedure: Left Heart Cath and Coronary Angiography;  Surgeon: Jettie Booze, MD;  Location: [facility];  Service: Cardiovascular;  Laterality: N/A;  . CARDIAC CATHETERIZATION  12/13/2014   Procedure: Coronary Stent Intervention;  Surgeon: Jettie Booze, MD;  Location: [facility];  Service: Cardiovascular;;  . CARDIAC CATHETERIZATION N/A 10/07/2015   Procedure: Left Heart Cath and Cors/Grafts Angiography;  Surgeon: Burnell Blanks, MD;  Location: [facility];  Service: Cardiovascular;  Laterality: N/A;  . CARDIAC CATHETERIZATION N/A 10/07/2015   Procedure: Coronary Stent Intervention;  Surgeon: Burnell Blanks, MD;  Location: [facility];  Service: Cardiovascular;  Laterality: N/A;  . CORONARY ANGIOPLASTY  "several"  . CORONARY ANGIOPLASTY WITH STENT PLACEMENT  2005; 12/13/2014   "2; 1"  . CORONARY ARTERY BYPASS GRAFT  1996   CABG X5  . CORONARY ARTERY BYPASS GRAFT  March 2007   CABG X3  . ESOPHAGOGASTRODUODENOSCOPY (EGD) WITH ESOPHAGEAL DILATION  2000  . GREEN LIGHT LASER TURP (TRANSURETHRAL RESECTION OF PROSTATE  2000's   "not cancerous"  . HERNIA REPAIR    . LAPAROSCOPIC CHOLECYSTECTOMY    . LEFT HEART CATH AND CORS/GRAFTS ANGIOGRAPHY N/A 07/28/2017   Procedure: LEFT HEART CATH AND CORS/GRAFTS ANGIOGRAPHY;  Surgeon: Troy Sine, MD;  Location: [facility];  Service: Cardiovascular;  Laterality: N/A;  . LUNG SURGERY  1996   "S/P CABG, had to put staple in lung after it had collapsed"  . UMBILICAL HERNIA REPAIR     w/chole    Family History  Problem Relation Age of Onset  . Heart attack Mother        MI  . Stroke Mother   . Heart disease Mother   . Hypertension Mother   . Hyperlipidemia Mother   . Heart disease Father   . Rheumatic fever Father   . Colon cancer Neg Hx    Social History   Socioeconomic History  . Marital status: Married    Spouse name: Not on file  . Number of children: 2  .  Years of education: Not on file  . Highest education level: 8th grade  Occupational History  . Occupation: Retired  Scientific laboratory technician  . Financial resource strain: Not hard at all  . Food insecurity    Worry: Never true    Inability: Never true  . Transportation needs    Medical: No    Non-medical: No  Tobacco Use  . Smoking status: Former Smoker    Packs/day: 3.00    Years: 20.00    Pack years: 60.00    Types: Cigarettes    Quit date: 07/25/1986    Years since quitting: 32.7  . Smokeless tobacco: Never Used  Substance and Sexual Activity  . Alcohol use: No    Alcohol/week: 0.0 standard drinks  . Drug use: No  . Sexual activity: Not Currently  Lifestyle  . Physical activity    Days per week: 0 days    Minutes per session: 0 min  . Stress: Not at all  Relationships  . Social Herbalist on phone: Patient refused    Gets together: Patient refused    Attends religious service: Patient refused    Active member of club or organization: Patient refused    Attends meetings of clubs or organizations: Patient refused    Relationship status: Patient refused  Other Topics Concern  . Not on file  Social History Narrative   Did auto salvage work.   Lives at home with his wife.  Independent at baseline.    Outpatient Encounter Medications as of 04/30/2019  Medication Sig  . allopurinol (ZYLOPRIM) 100 MG tablet TAKE 1 TABLET BY MOUTH AT  BEDTIME  . amiodarone (PACERONE) 200 MG tablet Take 1 tablet (200 mg total) by mouth daily.  Ronald [name] amLODipine (NORVASC) 10 MG tablet Take 1 tablet (10 mg total) by  mouth daily.  Ronald [name] aspirin EC 81 MG tablet Take 1 tablet (81 mg total) by mouth daily.  Ronald [name] atorvastatin (LIPITOR) 40 MG tablet TAKE 1 TABLET BY MOUTH  DAILY  . Cholecalciferol (VITAMIN D-3) 125 MCG (5000 UT) TABS Take 1 tablet by mouth daily.  . furosemide (LASIX) 40 MG tablet Take 40 mg by mouth daily as needed for fluid or edema.   . isosorbide mononitrate (IMDUR) 30 MG 24 hr tablet TAKE 1 TABLET BY MOUTH  DAILY  . loratadine (CLARITIN) 10 MG tablet Take 10 mg by mouth daily as needed for allergies.  . magnesium oxide (MAG-OX) 400 MG tablet TAKE 1 TABLET BY MOUTH TWICE A DAY  . Melatonin 5 MG TABS Take 1 tablet by mouth at bedtime.  . metoprolol tartrate (LOPRESSOR) 100 MG tablet Take 1 tablet (100 mg total) by mouth 2 (two) times daily.  . Multiple Vitamin (MULTIVITAMIN PO) Take 1 tablet by mouth daily.  . nitroGLYCERIN (NITROSTAT) 0.4 MG SL tablet Place 0.4 mg under the tongue every 5 (five) minutes as needed for chest pain (Up to 3 times).  . pantoprazole (PROTONIX) 40 MG tablet Take 2 tablets (80 mg total) by mouth daily. (Patient taking differently: Take 40 mg by mouth 2 (two) times daily. )  . Rivaroxaban (XARELTO) 15 MG TABS tablet Take 1 tablet (15 mg total) by mouth daily with supper.  . sodium bicarbonate 650 MG tablet Take 650 mg by mouth 2 (two) times daily. Evening   No facility-administered encounter medications on file as of 04/30/2019.     Activities of Daily Living In your present state of health, do you have any difficulty performing  the following activities: 04/19/2019 06/10/2018  Hearing? Y Y  Comment Deaf in left ear. -  Vision? N N  Difficulty concentrating or making decisions? N N  Walking or climbing stairs? N N  Dressing or bathing? N N  Doing errands, shopping? N N  Preparing Food and eating ? N -  Using the Toilet? N -  In the past six months, have you accidently leaked urine? Y -  Comment Occasionally due to previous last tx. -  Do you have problems with loss  of bowel control? N -  Managing your Medications? N -  Managing your Finances? N -  Housekeeping or managing your Housekeeping? N -  Some recent data might be hidden    Patient Care Team: [name]., MD as PCP - General (Unknown Physician Specialty) Burnell Blanks, MD as PCP - Cardiology (Cardiology) Murlean Iba, MD as Consulting Physician (Internal Medicine) Cathi Roan, [facility] (Pharmacist) Eulogio Bear, MD as Consulting Physician (Ophthalmology) Margaretha Sheffield, MD (Otolaryngology)   Assessment:   This is a routine wellness examination for Maddyx.  Exercise Activities and Dietary recommendations    Goals    . Cut out extra servings     Recommend to cut out all snacks and sweets after dinner to help aid in weight loss.     . Increase water intake     Starting 04/16/16, I will increase my water intake to 4-5 glasses a day.       Fall Risk Fall Risk  04/19/2019 12/18/2018 04/17/2018 01/23/2018 01/21/2017  Falls in the past year? 0 0 0 No No  Number falls in past yr: 0 - - - -  Injury with Fall? 0 - - - -  Risk for fall due to : - - - - -  Risk for fall due to: Comment - - - - -   FALL RISK PREVENTION PERTAINING TO THE HOME:  Any stairs in or around the home? Yes  If so, are there any without handrails? Yes   Home free of loose throw rugs in walkways, pet beds, electrical cords, etc? Yes  Adequate lighting in your home to reduce risk of falls? Yes   ASSISTIVE DEVICES UTILIZED TO PREVENT FALLS:  Life alert? No  Use of a cane, walker or w/c? No  Grab bars in the bathroom? No  Shower chair or bench in shower? Yes  Elevated toilet seat or a handicapped toilet? No    TIMED UP AND GO:  Was the test performed? No .    Depression Screen PHQ 2/9 Scores 04/19/2019 04/17/2018 01/23/2018 01/21/2017  PHQ - 2 Score 0 0 0 0  PHQ- 9 Score - - - -  Exception Documentation - - - -    Cognitive Function: Declined today.      6CIT Screen  04/17/2018 04/16/2016  What Year? 0 points 0 points  What month? 0 points 0 points  What time? 0 points 0 points  Count back from 20 0 points 0 points  Months in reverse 4 points 4 points  Repeat phrase 2 points 10 points  Total Score 6 14    Immunization History  Administered Date(s) Administered  . Fluad Quad(high Dose 65+) 03/01/2019  . Influenza, High Dose Seasonal PF 03/18/2015, 03/02/2016, 02/13/2018  . Influenza-Unspecified 03/14/2017  . Pneumococcal Conjugate-13 12/25/2013  . Pneumococcal Polysaccharide-23 03/04/2009  . Td 10/10/2003, 04/16/2016    Qualifies for Shingles Vaccine? Yes . Due for Shingrix. Pt has been advised  to call insurance company to determine out of pocket expense. Advised may also receive vaccine at local pharmacy or Health Dept. Verbalized acceptance and understanding.  Tdap: Up to date  Flu Vaccine: Up to date  Pneumococcal Vaccine: Up to date  Screening Tests Health Maintenance  Topic Date Due  . FOOT EXAM  01/11/2018  . URINE MICROALBUMIN  01/11/2018  . HEMOGLOBIN A1C  08/30/2019  . COLONOSCOPY  11/19/2019  . OPHTHALMOLOGY EXAM  12/12/2019  . TETANUS/TDAP  04/16/2026  . INFLUENZA VACCINE  Completed  . Hepatitis C Screening  Completed  . PNA vac Low Risk Adult  Completed   Cancer Screenings:  Colorectal Screening: Completed 11/18/16. Repeat every 3 years.  Lung Cancer Screening: (Low Dose CT Chest recommended if Age 25-80 years, 30 pack-year currently smoking OR have quit w/in 15years.) does not qualify.   Additional Screening:  Hepatitis C Screening: Up to date  Dental Screening: Recommended annual dental exams for proper oral hygiene  Community Resource Referral:  CRR required this visit?  No        Plan:  I have personally reviewed and addressed the Medicare Annual Wellness questionnaire and have noted the following in the patient's chart:  A. Medical and social history B. Use of alcohol, tobacco or illicit drugs  C.  Current medications and supplements D. Functional ability and status E.  Nutritional status F.  Physical activity G. Advance directives H. List of other physicians I.  Hospitalizations, surgeries, and ER visits in previous 12 months J.  [place] such as hearing and vision if needed, cognitive and depression L. Referrals and appointments   In addition, I have reviewed and discussed with patient certain preventive protocols, quality metrics, and best practice recommendations. A written personalized care plan for preventive services as well as general preventive health recommendations were provided to patient.   Enos Fling Makhi Muzquiz, [place]  04/30/2019 Nurse Health Advisor  Nurse Notes: Pt needs a diabetic foot exam and urine check at next in office apt.        P

## 2019-04-30 ENCOUNTER — Other Ambulatory Visit: Payer: Self-pay

## 2019-04-30 ENCOUNTER — Telehealth: Payer: Self-pay | Admitting: Family Medicine

## 2019-04-30 ENCOUNTER — Encounter: Payer: Self-pay | Admitting: Family Medicine

## 2019-04-30 ENCOUNTER — Ambulatory Visit (INDEPENDENT_AMBULATORY_CARE_PROVIDER_SITE_OTHER): Payer: Medicare Other | Admitting: Family Medicine

## 2019-04-30 VITALS — BP 130/60 | HR 56 | Temp 97.3°F | Resp 18 | Ht 70.0 in | Wt 256.0 lb

## 2019-04-30 DIAGNOSIS — Z0000 Encounter for general adult medical examination without abnormal findings: Secondary | ICD-10-CM

## 2019-04-30 DIAGNOSIS — E1121 Type 2 diabetes mellitus with diabetic nephropathy: Secondary | ICD-10-CM | POA: Diagnosis not present

## 2019-04-30 DIAGNOSIS — I4891 Unspecified atrial fibrillation: Secondary | ICD-10-CM

## 2019-04-30 DIAGNOSIS — G4733 Obstructive sleep apnea (adult) (pediatric): Secondary | ICD-10-CM | POA: Diagnosis not present

## 2019-04-30 DIAGNOSIS — I10 Essential (primary) hypertension: Secondary | ICD-10-CM | POA: Diagnosis not present

## 2019-04-30 DIAGNOSIS — M545 Low back pain: Secondary | ICD-10-CM

## 2019-04-30 DIAGNOSIS — I25110 Atherosclerotic heart disease of native coronary artery with unstable angina pectoris: Secondary | ICD-10-CM

## 2019-04-30 DIAGNOSIS — M7061 Trochanteric bursitis, right hip: Secondary | ICD-10-CM

## 2019-04-30 DIAGNOSIS — E7800 Pure hypercholesterolemia, unspecified: Secondary | ICD-10-CM

## 2019-04-30 DIAGNOSIS — G8929 Other chronic pain: Secondary | ICD-10-CM

## 2019-04-30 LAB — POCT GLYCOSYLATED HEMOGLOBIN (HGB A1C)
Est. average glucose Bld gHb Est-mCnc: 117
Hemoglobin A1C: 5.7 % — AB (ref 4.0–5.6)

## 2019-04-30 LAB — POCT UA - MICROALBUMIN: Microalbumin Ur, POC: 50 mg/L

## 2019-04-30 NOTE — Telephone Encounter (Signed)
Returned Mrs. Dubas's call, left HIPAA complaint voicemail with my contact information.   Ruben Reason, PharmD Clinical Pharmacist [facility] [phone]

## 2019-04-30 NOTE — Progress Notes (Signed)
Patient: Ronald [name], Male    DOB: 24-May-1946, 73 y.o.   MRN: [id number] Visit Date: 04/30/2019  Today's Provider: [name], MD   Chief Complaint  Patient presents with  . Follow-up  . Diabetes  . Hypertension   Subjective:     Complete Physical Ronald [name] is a 73 y.o. male. He feels fairly well. He reports he is not exercising due to hip pain . He reports he is sleeping fairly well. He has slowed down by the right leg back and hip pain that he has.  He physically does not do much.  Overall he is feeling fairly well. -----------------------------------------------------------   Review of Systems  Constitutional: Negative.   HENT: Negative.   Eyes: Negative.   Respiratory: Negative.   Cardiovascular: Negative.   Gastrointestinal: Negative.   Endocrine: Negative.   Genitourinary: Negative.   Musculoskeletal: Positive for arthralgias and myalgias.  Skin: Negative.   Allergic/Immunologic: Negative.   Neurological: Negative.   Hematological: Negative.   Psychiatric/Behavioral: Negative.     Social History   Socioeconomic History  . Marital status: Married    Spouse name: Not on file  . Number of children: 2  . Years of education: Not on file  . Highest education level: 8th grade  Occupational History  . Occupation: Retired  Scientific laboratory technician  . Financial resource strain: Not hard at all  . Food insecurity    Worry: Never true    Inability: Never true  . Transportation needs    Medical: No    Non-medical: No  Tobacco Use  . Smoking status: Former Smoker    Packs/day: 3.00    Years: 20.00    Pack years: 60.00    Types: Cigarettes    Quit date: 07/25/1986    Years since quitting: 32.7  . Smokeless tobacco: Never Used  Substance and Sexual Activity  . Alcohol use: No    Alcohol/week: 0.0 standard drinks  . Drug use: No  . Sexual activity: Not Currently  Lifestyle  . Physical activity    Days per week: 0 days    Minutes per  session: 0 min  . Stress: Not at all  Relationships  . Social Herbalist on phone: Patient refused    Gets together: Patient refused    Attends religious service: Patient refused    Active member of club or organization: Patient refused    Attends meetings of clubs or organizations: Patient refused    Relationship status: Patient refused  . Intimate partner violence    Fear of current or ex partner: Patient refused    Emotionally abused: Patient refused    Physically abused: Patient refused    Forced sexual activity: Patient refused  Other Topics Concern  . Not on file  Social History Narrative   Did auto salvage work.   Lives at home with his wife.  Independent at baseline.    Past Medical History:  Diagnosis Date  . AAA (abdominal aortic aneurysm) ([place])    a. 3cm by [place] 2015.  [name] Arthritis    "hips; back" (12/13/2014)  . CAD (coronary artery disease) 2007   a. s/p CABG- IMA-LAD, VG-Cx, VG-RCA, VG-diag in 1999. B. sp redo CABG- VG-OM, VG-RCA in 2007 due to VG disease. c. NSTEMI 11/2014 s/p DES to SVG-OM from the Y graft.d. PTCA/DES x 1 distal body of SVG to Diagonal.09/2015  . Chronic combined systolic and diastolic CHF (congestive  heart failure) ([place])    a. remote EF 40-45% in 2006. b. Normal EF 2014. b. Echo 07/2016 EF 45-50%, grade 1 DD.  [name] Chronic lower back pain   . CKD (chronic kidney disease), stage IV ([place])   . COPD (chronic obstructive pulmonary disease) ([place])   . Deafness in left ear   . Degenerative disc disease, lumbar   . Dilated cardiomyopathy ([place]) 10/07/2015  . Emphysema   . Esophageal stricture 07/02/1998   EGD  . GERD (gastroesophageal reflux disease)   . History of gout    "last flareup was in 2007" (12/13/2014)  . History of hiatal hernia   . Hyperlipidemia   . Hypertension   . Ischemic cardiomyopathy 2006   EF 40% to 50% by 2D echo in 2006;  Echo 12/31/12: Mild LVH, EF 50-55%, normal wall motion.   [name] PVC's (premature ventricular contractions)   .  Renal artery stenosis ([place])    a. noted on CT 2008.  . Type II diabetes mellitus ([place])    Diet control   . Walking pneumonia 1990's     Patient Active Problem List   Diagnosis Date Noted  . Bursitis of hip 12/18/2018  . Pain in lower limb 12/18/2018  . A-fib ([place]) 06/10/2018  . CKD (chronic kidney disease), stage IV ([place]) 07/29/2017  . Obesity 07/29/2017  . Unstable angina ([place]) 07/27/2017  . Atherosclerosis of native arteries of extremity with intermittent claudication ([place]) 06/25/2016  . Acute renal failure superimposed on stage 3 chronic kidney disease ([place]) 10/07/2015  . Dilated cardiomyopathy ([place]) 10/07/2015  . Coronary artery disease involving native coronary artery of native heart with unstable angina pectoris ([place])   . Allergic rhinitis 08/19/2015  . NSTEMI (non-ST elevated myocardial infarction) ([place]) 12/13/2014  . AA (aortic aneurysm) ([place]) 10/04/2014  . Arteriosclerosis of coronary artery 10/04/2014  . Diabetes mellitus, type 2 ([place]) 10/04/2014  . Acid reflux 10/04/2014  . 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 HLD (hyperlipidemia) 10/04/2014  . BP (high blood pressure) 10/04/2014  . Osteoarthrosis 10/04/2014  . Adult BMI 30+ 10/04/2014  . 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 Genital candidiasis in male 10/25/2012  . Urge incontinence of urine 10/24/2012  . 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 Incomplete emptying of bladder 10/24/2012  . Abdominal pain 11/09/2010  . Shortness of breath 11/12/2009  . Renal artery stenosis    . Mixed hyperlipidemia 09/04/2008  . History of redo bypass grafting 09/04/2008  . Diabetes mellitus with nephropathy [facility])     Past Surgical History:  Procedure Laterality Date  . CARDIAC CATHETERIZATION  "several"  . CARDIAC CATHETERIZATION N/A 12/13/2014   Procedure: Left Heart Cath and Coronary Angiography;  Surgeon: Jettie Booze, MD;  Location: [place] [facility];  Service: Cardiovascular;  Laterality: N/A;  . CARDIAC CATHETERIZATION  12/13/2014   Procedure: Coronary Stent Intervention;  Surgeon: Jettie Booze, MD;  Location: [place] [facility];  Service: Cardiovascular;;  . CARDIAC CATHETERIZATION N/A 10/07/2015   Procedure: Left Heart Cath and Cors/Grafts Angiography;  Surgeon: Burnell Blanks, MD;  Location: [place] [facility];  Service: Cardiovascular;  Laterality: N/A;  . CARDIAC CATHETERIZATION N/A 10/07/2015   Procedure: Coronary Stent Intervention;  Surgeon: Burnell Blanks, MD;  Location: [place] [facility];  Service: Cardiovascular;  Laterality: N/A;  . CORONARY ANGIOPLASTY  "several"  . CORONARY ANGIOPLASTY WITH STENT PLACEMENT  2005; 12/13/2014   "2; 1"  .  CORONARY ARTERY BYPASS GRAFT  1996   CABG X5  . CORONARY ARTERY BYPASS GRAFT  March 2007   CABG X3  . ESOPHAGOGASTRODUODENOSCOPY (EGD) WITH ESOPHAGEAL DILATION  2000  . GREEN LIGHT LASER TURP (TRANSURETHRAL RESECTION OF PROSTATE  2000's   "not cancerous"  . HERNIA REPAIR    . LAPAROSCOPIC CHOLECYSTECTOMY    . LEFT HEART CATH AND CORS/GRAFTS ANGIOGRAPHY N/A 07/28/2017   Procedure: LEFT HEART CATH AND CORS/GRAFTS ANGIOGRAPHY;  Surgeon: Troy Sine, MD;  Location: [place] [facility];  Service: Cardiovascular;  Laterality: N/A;  . LUNG SURGERY  1996   "S/P CABG, had to put staple in lung after it had collapsed"  . UMBILICAL HERNIA REPAIR     w/chole    His family history includes Heart attack in his mother; Heart disease in his father and mother; Hyperlipidemia in his mother; Hypertension in his mother; Rheumatic fever in his father; Stroke in his mother. There is no history of Colon cancer.   Current Outpatient Medications:  .  allopurinol (ZYLOPRIM) 100 MG tablet, TAKE 1 TABLET BY MOUTH AT  BEDTIME, Disp: 90 tablet, Rfl: 3 .  amiodarone (PACERONE) 200 MG tablet, Take 1 tablet (200 mg total) by mouth daily., Disp: 90 tablet, Rfl: 2 .  amLODipine  (NORVASC) 10 MG tablet, Take 1 tablet (10 mg total) by mouth daily., Disp: 90 tablet, Rfl: 3 .  aspirin EC 81 MG tablet, Take 1 tablet (81 mg total) by mouth daily., Disp: 90 tablet, Rfl: 3 .  atorvastatin (LIPITOR) 40 MG tablet, TAKE 1 TABLET BY MOUTH  DAILY, Disp: 90 tablet, Rfl: 2 .  Cholecalciferol (VITAMIN D-3) 125 MCG (5000 UT) TABS, Take 1 tablet by mouth daily., Disp: , Rfl:  .  furosemide (LASIX) 40 MG tablet, Take 40 mg by mouth daily as needed for fluid or edema. , Disp: , Rfl: 6 .  isosorbide mononitrate (IMDUR) 30 MG 24 hr tablet, TAKE 1 TABLET BY MOUTH  DAILY, Disp: 90 tablet, Rfl: 2 .  loratadine (CLARITIN) 10 MG tablet, Take 10 mg by mouth daily as needed for allergies., Disp: , Rfl:  .  magnesium oxide (MAG-OX) 400 MG tablet, TAKE 1 TABLET BY MOUTH TWICE A DAY, Disp: 180 tablet, Rfl: 3 .  Melatonin 5 MG TABS, Take 1 tablet by mouth at bedtime., Disp: , Rfl:  .  metoprolol tartrate (LOPRESSOR) 100 MG tablet, Take 1 tablet (100 mg total) by mouth 2 (two) times daily., Disp: 180 tablet, Rfl: 3 .  Multiple Vitamin (MULTIVITAMIN PO), Take 1 tablet by mouth daily., Disp: , Rfl:  .  nitroGLYCERIN (NITROSTAT) 0.4 MG SL tablet, Place 0.4 mg under the tongue every 5 (five) minutes as needed for chest pain (Up to 3 times)., Disp: , Rfl:  .  pantoprazole (PROTONIX) 40 MG tablet, Take 2 tablets (80 mg total) by mouth daily. (Patient taking differently: Take 40 mg by mouth 2 (two) times daily. ), Disp: 180 tablet, Rfl: 3 .  Rivaroxaban (XARELTO) 15 MG TABS tablet, Take 1 tablet (15 mg total) by mouth daily with supper., Disp: 30 tablet, Rfl: 11 .  sodium bicarbonate 650 MG tablet, Take 650 mg by mouth 2 (two) times daily. Evening, Disp: , Rfl:   Patient Care Team: [name]., MD as PCP - General (Unknown Physician Specialty) Burnell Blanks, MD as PCP - Cardiology (Cardiology) Murlean Iba, MD as Consulting Physician (Internal Medicine) Cathi Roan, [facility] (Pharmacist)  Eulogio Bear, MD as Consulting  Physician (Ophthalmology) Margaretha Sheffield, MD (Otolaryngology)     Objective:    Vitals: BP 130/60   Pulse (!) 56   Temp (!) 97.3 F (36.3 C) (Temporal)   Resp 18   Ht 5\' 10"  (1.778 m)   Wt 256 lb (116.1 kg)   SpO2 97%   BMI 36.73 kg/m   Physical Exam Vitals signs reviewed.  Constitutional:      Appearance: He is well-developed. He is obese.  HENT:     Head: Normocephalic and atraumatic.     Right Ear: External ear normal.     Left Ear: External ear normal.     Nose: Nose normal.  Eyes:     General: No scleral icterus.    Conjunctiva/sclera: Conjunctivae normal.  Neck:     Thyroid: No thyromegaly.  Cardiovascular:     Rate and Rhythm: Normal rate and regular rhythm.     Heart sounds: Normal heart sounds.  Pulmonary:     Effort: Pulmonary effort is normal.     Breath sounds: Normal breath sounds.  Abdominal:     Palpations: Abdomen is soft.     Comments: Trunk obesity  Musculoskeletal:     Right lower leg: No edema.     Left lower leg: No edema.     Comments: He has mild tenderness over his right greater trochanter which reproduces the pain he describes as "right hip".  Skin:    General: Skin is warm and dry.  Neurological:     Mental Status: He is alert and oriented to person, place, and time. Mental status is at baseline.     Comments: Strength is normal in both lower extremities  Psychiatric:        Mood and Affect: Mood normal.        Behavior: Behavior normal.        Thought Content: Thought content normal.        Judgment: Judgment normal.     Activities of Daily Living In your present state of health, do you have any difficulty performing the following activities: 04/19/2019 06/10/2018  Hearing? Y Y  Comment Deaf in left ear. -  Vision? N N  Difficulty concentrating or making decisions? N N  Walking or climbing stairs? N N  Dressing or bathing? N N  Doing errands, shopping? N N  Preparing Food and eating ? N -   Using the Toilet? N -  In the past six months, have you accidently leaked urine? Y -  Comment Occasionally due to previous last tx. -  Do you have problems with loss of bowel control? N -  Managing your Medications? N -  Managing your Finances? N -  Housekeeping or managing your Housekeeping? N -  Some recent data might be hidden    Fall Risk Assessment Fall Risk  04/19/2019 12/18/2018 04/17/2018 01/23/2018 01/21/2017  Falls in the past year? 0 0 0 No No  Number falls in past yr: 0 - - - -  Injury with Fall? 0 - - - -  Risk for fall due to : - - - - -  Risk for fall due to: Comment - - - - -     Depression Screen PHQ 2/9 Scores 04/19/2019 04/17/2018 01/23/2018 01/21/2017  PHQ - 2 Score 0 0 0 0  PHQ- 9 Score - - - -  Exception Documentation - - - -    6CIT Screen 04/17/2018  What Year? 0 points  What month? 0 points  What time? 0 points  Count back from 20 0 points  Months in reverse 4 points  Repeat phrase 2 points  Total Score 6       Assessment & Plan:    Annual Physical Reviewed patient's Family Medical History Reviewed and updated list of patient's medical providers Assessment of cognitive impairment was done Assessed patient's functional ability Established a written schedule for health screening [place] Completed and Reviewed  Exercise Activities and Dietary recommendations Goals    . Cut out extra servings     Recommend to cut out all snacks and sweets after dinner to help aid in weight loss.     . Increase water intake     Starting 04/16/16, I will increase my water intake to 4-5 glasses a day.       Immunization History  Administered Date(s) Administered  . Fluad Quad(high Dose 65+) 03/01/2019  . Influenza, High Dose Seasonal PF 03/18/2015, 03/02/2016, 02/13/2018  . Influenza-Unspecified 03/14/2017  . Pneumococcal Conjugate-13 12/25/2013  . Pneumococcal Polysaccharide-23 03/04/2009  . Td 10/10/2003, 04/16/2016    Health  Maintenance  Topic Date Due  . FOOT EXAM  01/11/2018  . HEMOGLOBIN A1C  10/28/2019  . COLONOSCOPY  11/19/2019  . OPHTHALMOLOGY EXAM  12/12/2019  . URINE MICROALBUMIN  04/29/2020  . TETANUS/TDAP  04/16/2026  . INFLUENZA VACCINE  Completed  . Hepatitis C Screening  Completed  . PNA vac Low Risk Adult  Completed     Discussed health benefits of physical activity, and encouraged him to engage in regular exercise appropriate for his age and condition.    ------------------------------------------------------------------------------------------------------------  1. Annual physical exam   2. Essential hypertension Presently controlled - POCT UA - Microalbumin - CBC w/Diff/Platelet - TSH - Comprehensive Metabolic Panel (CMET) - Lipid panel  3. Atrial fibrillation, unspecified type (HCC) On Xarelto - CBC w/Diff/Platelet - TSH - Comprehensive Metabolic Panel (CMET) - Lipid panel  4. Coronary artery disease involving native coronary artery of native heart with unstable angina pectoris ([place]) All risk factors treated - CBC w/Diff/Platelet - TSH - Comprehensive Metabolic Panel (CMET) - Lipid panel  5. OSA (obstructive sleep apnea) On CPAP  6. Diabetes mellitus with nephropathy (HCC) A1c today is 5.7.  Very good control. - POCT HgB A1C  7. Pure hypercholesterolemia On atorvastatin - CBC w/Diff/Platelet - TSH - Comprehensive Metabolic Panel (CMET) - Lipid panel 8.  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  Trochanteric bursitis on right Consider referring to orthopedics for consideration of injection.  He has not cannot take nonsteroidals due to anticoagulation and chronic kidney disease.  Richard Cranford Mon, MD  [facility]

## 2019-04-30 NOTE — Telephone Encounter (Signed)
Mrs [facility] called the [facility] wanting to speak to you about Ronald [name].  Will you please call her ASAP?

## 2019-05-01 DIAGNOSIS — I25110 Atherosclerotic heart disease of native coronary artery with unstable angina pectoris: Secondary | ICD-10-CM | POA: Diagnosis not present

## 2019-05-01 DIAGNOSIS — I10 Essential (primary) hypertension: Secondary | ICD-10-CM | POA: Diagnosis not present

## 2019-05-01 DIAGNOSIS — E7800 Pure hypercholesterolemia, unspecified: Secondary | ICD-10-CM | POA: Diagnosis not present

## 2019-05-01 DIAGNOSIS — I4891 Unspecified atrial fibrillation: Secondary | ICD-10-CM | POA: Diagnosis not present

## 2019-05-02 LAB — LIPID PANEL
Chol/HDL Ratio: 3.3 ratio (ref 0.0–5.0)
Cholesterol, Total: 171 mg/dL (ref 100–199)
HDL: 52 mg/dL (ref >39)
LDL Chol Calc (NIH): 101 mg/dL — ABNORMAL HIGH (ref 0–99)
Triglycerides: 96 mg/dL (ref 0–149)
VLDL Cholesterol Cal: 18 mg/dL (ref 5–40)

## 2019-05-02 LAB — CBC WITH DIFFERENTIAL/PLATELET
Basophils Absolute: 0.1 10*3/uL (ref 0.0–0.2)
Basos: 1 %
EOS (ABSOLUTE): 0.3 10*3/uL (ref 0.0–0.4)
Eos: 5 %
Hematocrit: 38.8 % (ref 37.5–51.0)
Hemoglobin: 12.6 g/dL — ABNORMAL LOW (ref 13.0–17.7)
Immature Grans (Abs): 0 10*3/uL (ref 0.0–0.1)
Immature Granulocytes: 1 %
Lymphocytes Absolute: 1.6 10*3/uL (ref 0.7–3.1)
Lymphs: 24 %
MCH: 29.5 pg (ref 26.6–33.0)
MCHC: 32.5 g/dL (ref 31.5–35.7)
MCV: 91 fL (ref 79–97)
Monocytes Absolute: 0.9 10*3/uL (ref 0.1–0.9)
Monocytes: 13 %
Neutrophils Absolute: 3.6 10*3/uL (ref 1.4–7.0)
Neutrophils: 56 %
Platelets: 147 10*3/uL — ABNORMAL LOW (ref 150–450)
RBC: 4.27 x10E6/uL (ref 4.14–5.80)
RDW: 14.8 % (ref 11.6–15.4)
WBC: 6.5 10*3/uL (ref 3.4–10.8)

## 2019-05-02 LAB — COMPREHENSIVE METABOLIC PANEL
ALT: 35 IU/L (ref 0–44)
AST: 32 IU/L (ref 0–40)
Albumin/Globulin Ratio: 2.4 — ABNORMAL HIGH (ref 1.2–2.2)
Albumin: 4.3 g/dL (ref 3.7–4.7)
Alkaline Phosphatase: 178 IU/L — ABNORMAL HIGH (ref 39–117)
BUN/Creatinine Ratio: 12 (ref 10–24)
BUN: 31 mg/dL — ABNORMAL HIGH (ref 8–27)
Bilirubin Total: 0.7 mg/dL (ref 0.0–1.2)
CO2: 22 mmol/L (ref 20–29)
Calcium: 9 mg/dL (ref 8.6–10.2)
Chloride: 108 mmol/L — ABNORMAL HIGH (ref 96–106)
Creatinine, Ser: 2.59 mg/dL — ABNORMAL HIGH (ref 0.76–1.27)
GFR calc Af Amer: 27 mL/min/{1.73_m2} — ABNORMAL LOW (ref >59)
GFR calc non Af Amer: 23 mL/min/{1.73_m2} — ABNORMAL LOW (ref >59)
Globulin, Total: 1.8 g/dL (ref 1.5–4.5)
Glucose: 130 mg/dL — ABNORMAL HIGH (ref 65–99)
Potassium: 4.8 mmol/L (ref 3.5–5.2)
Sodium: 143 mmol/L (ref 134–144)
Total Protein: 6.1 g/dL (ref 6.0–8.5)

## 2019-05-02 LAB — TSH: TSH: 20.6 u[IU]/mL — ABNORMAL HIGH (ref 0.450–4.500)

## 2019-05-03 ENCOUNTER — Telehealth: Payer: Self-pay | Admitting: *Deleted

## 2019-05-03 ENCOUNTER — Ambulatory Visit: Payer: Self-pay | Admitting: Pharmacist

## 2019-05-03 MED ORDER — LEVOTHYROXINE SODIUM 25 MCG PO TABS: 25.0000 ug | ORAL_TABLET | Freq: Every day | ORAL | 1 refills | Status: DC

## 2019-05-03 NOTE — Telephone Encounter (Signed)
Patient was notified of results. Expressed understanding. Rx was sent to pharmacy and appt scheduled.

## 2019-05-03 NOTE — Telephone Encounter (Signed)
-----   Message from [name]., MD sent at 05/03/2019  8:40 AM EST ----- Labs okay.  I do not see that patient is on Synthroid.  Would start levothyroxine 25 mcg daily and recheck 3-4 months

## 2019-05-07 ENCOUNTER — Ambulatory Visit: Payer: Self-pay | Admitting: Pharmacist

## 2019-05-07 ENCOUNTER — Telehealth: Payer: Self-pay | Admitting: Cardiovascular Disease

## 2019-05-07 DIAGNOSIS — I4891 Unspecified atrial fibrillation: Secondary | ICD-10-CM

## 2019-05-07 NOTE — Chronic Care Management (AMB) (Signed)
  Chronic Care Management   Care Coordination Note  05/07/2019 Name: Slyvester Latona [facility] MRN: [id number] DOB: 19-Feb-1946  Incoming call from [name] (DPR on file). Mrs. Frost states that patient's last dose of Xarelto will be this evening.   Contacted Dr. Camillia Herter office for Xarelto samples. He is the prescriber of this medication. Dr. Camillia Herter office will return call to patient regarding Xarelto 15mg .   I will contact patient/wife on Wednesday when our samples arrive.   Follow up plan: Telephone follow up appointment with care management team member scheduled for: Dec 9  Ruben Reason, PharmD Clinical Pharmacist [place] [phone]

## 2019-05-07 NOTE — Telephone Encounter (Signed)
New Message  Patient calling the office for samples of medication:   1.  What medication and dosage are you requesting samples for?  Rivaroxaban (XARELTO) 15 MG TABS tablet  2.  Are you currently out of this medication? Yes

## 2019-05-07 NOTE — Telephone Encounter (Signed)
Called pt and spoke with pt's wife and the wife stated that pt was in the donut hole and needed some samples until the first of the year. I advised the wife that I could leave 2 bottles of Xarelto 15 mg tablet downstairs for pt to pick up. I advised the wife that if the pt has any other problems, questions or concerns, to give our office a call back. Wife verbalized understanding.

## 2019-05-07 NOTE — Chronic Care Management (AMB) (Signed)
  Chronic Care Management   Care Coordination Note  05/07/2019- late entry Name: Ronald [name] [facility] MRN: [id number] DOB: 12-Jun-1945  Incoming call from Ronald [name] stating that patient has 10 days left of Xarelto and requesting sample. Ronald [name] checked sample closet for me and noted that there were no Xarelto 15mg  remaining. She placed an order with [employer] rep to be delivered Wednesday 12/9. I have placed a reminder to have 2 bottles set aside for Ronald [name]   Follow up plan: Telephone follow up appointment with care management team member scheduled for: Wed Dec 9  Ronald [name], PharmD Clinical Pharmacist [place] [phone]

## 2019-05-09 ENCOUNTER — Ambulatory Visit: Payer: Self-pay | Admitting: Pharmacist

## 2019-05-09 NOTE — Chronic Care Management (AMB) (Signed)
  Chronic Care Management   Note  05/09/2019 Name: Ronald [name] [facility] MRN: [id number] DOB: 09/25/1945  Contacted Ronald [name] to let her know that 2 sample bottles of Xarelto 15mg  have been set aside for Ronald [name] at the front desk. Along with 2 bottles from [facility], this should last until the new year.   Follow up plan: The patient has been provided with contact information for the care management team and has been advised to call with any health related questions or concerns.   Ruben Reason, PharmD Clinical Pharmacist [place] [phone]

## 2019-05-13 DIAGNOSIS — G4733 Obstructive sleep apnea (adult) (pediatric): Secondary | ICD-10-CM | POA: Diagnosis not present

## 2019-05-14 DIAGNOSIS — N184 Chronic kidney disease, stage 4 (severe): Secondary | ICD-10-CM | POA: Diagnosis not present

## 2019-05-14 DIAGNOSIS — I10 Essential (primary) hypertension: Secondary | ICD-10-CM | POA: Diagnosis not present

## 2019-05-14 DIAGNOSIS — E872 Acidosis: Secondary | ICD-10-CM | POA: Diagnosis not present

## 2019-05-14 DIAGNOSIS — R600 Localized edema: Secondary | ICD-10-CM | POA: Diagnosis not present

## 2019-05-14 DIAGNOSIS — N2581 Secondary hyperparathyroidism of renal origin: Secondary | ICD-10-CM | POA: Diagnosis not present

## 2019-05-18 DIAGNOSIS — I701 Atherosclerosis of renal artery: Secondary | ICD-10-CM | POA: Diagnosis not present

## 2019-05-18 DIAGNOSIS — N2581 Secondary hyperparathyroidism of renal origin: Secondary | ICD-10-CM | POA: Insufficient documentation

## 2019-05-18 DIAGNOSIS — R801 Persistent proteinuria, unspecified: Secondary | ICD-10-CM | POA: Diagnosis not present

## 2019-05-18 DIAGNOSIS — R600 Localized edema: Secondary | ICD-10-CM | POA: Diagnosis not present

## 2019-05-18 DIAGNOSIS — I10 Essential (primary) hypertension: Secondary | ICD-10-CM | POA: Diagnosis not present

## 2019-05-18 DIAGNOSIS — N184 Chronic kidney disease, stage 4 (severe): Secondary | ICD-10-CM | POA: Diagnosis not present

## 2019-05-21 ENCOUNTER — Other Ambulatory Visit: Payer: Self-pay | Admitting: Cardiovascular Disease

## 2019-05-26 ENCOUNTER — Other Ambulatory Visit: Payer: Self-pay | Admitting: Family Medicine

## 2019-06-06 ENCOUNTER — Other Ambulatory Visit: Payer: Self-pay | Admitting: Cardiovascular Disease

## 2019-06-13 DIAGNOSIS — G4733 Obstructive sleep apnea (adult) (pediatric): Secondary | ICD-10-CM | POA: Diagnosis not present

## 2019-06-15 DIAGNOSIS — H353132 Nonexudative age-related macular degeneration, bilateral, intermediate dry stage: Secondary | ICD-10-CM | POA: Diagnosis not present

## 2019-06-15 LAB — HM DIABETES EYE EXAM

## 2019-06-19 ENCOUNTER — Other Ambulatory Visit: Payer: Self-pay | Admitting: Family Medicine

## 2019-06-19 NOTE — Telephone Encounter (Signed)
Requested Prescriptions  Pending Prescriptions Disp Refills  . levothyroxine (SYNTHROID) 25 MCG tablet [Pharmacy Med Name: LEVOTHYROXINE 25 MCG TABLET] 60 tablet 1    Sig: TAKE 1 TABLET BY MOUTH EVERY DAY BEFORE BREAKFAST     Endocrinology:  Hypothyroid Agents Failed - 06/19/2019  2:33 PM      Failed - TSH needs to be rechecked within 3 months after an abnormal result. Refill until TSH is due.      Failed - TSH in normal range and within 360 days    TSH  Date Value Ref Range Status  05/01/2019 20.600 (H) 0.450 - 4.500 uIU/mL Final         Passed - Valid encounter within last 12 months    Recent Outpatient Visits          1 month ago Annual physical exam   [facility] [name]., MD   3 months ago Diabetes mellitus with nephropathy [facility])   [facility] [name]., MD   7 months ago CKD (chronic kidney disease) stage 3, GFR 30-59 ml/min [facility])   [facility] [name]., MD   12 months ago Atrial fibrillation, unspecified type [facility])   [facility] [name]., MD   1 year ago Dilated cardiomyopathy [facility])   [facility] [name]., MD      Future Appointments            In 1 week Angelena Form Annita Brod, MD [place], [facility]   In 1 month [name]., MD [facility], [place]   In 4 months [name]., MD [facility], [place]

## 2019-06-30 ENCOUNTER — Other Ambulatory Visit: Payer: Self-pay | Admitting: Cardiovascular Disease

## 2019-07-02 ENCOUNTER — Other Ambulatory Visit: Payer: Self-pay

## 2019-07-02 ENCOUNTER — Ambulatory Visit (INDEPENDENT_AMBULATORY_CARE_PROVIDER_SITE_OTHER): Payer: Medicare Other | Admitting: Cardiovascular Disease

## 2019-07-02 ENCOUNTER — Encounter: Payer: Self-pay | Admitting: Cardiovascular Disease

## 2019-07-02 VITALS — BP 164/70 | HR 62 | Ht 70.0 in | Wt 262.0 lb

## 2019-07-02 DIAGNOSIS — I255 Ischemic cardiomyopathy: Secondary | ICD-10-CM | POA: Diagnosis not present

## 2019-07-02 DIAGNOSIS — I10 Essential (primary) hypertension: Secondary | ICD-10-CM | POA: Diagnosis not present

## 2019-07-02 DIAGNOSIS — I2510 Atherosclerotic heart disease of native coronary artery without angina pectoris: Secondary | ICD-10-CM | POA: Diagnosis not present

## 2019-07-02 DIAGNOSIS — I714 Abdominal aortic aneurysm, without rupture, unspecified: Secondary | ICD-10-CM

## 2019-07-02 DIAGNOSIS — I5042 Chronic combined systolic (congestive) and diastolic (congestive) heart failure: Secondary | ICD-10-CM

## 2019-07-02 NOTE — Patient Instructions (Signed)
Medication Instructions:  Your physician recommends that you continue on your current medications as directed. Please refer to the Current Medication list given to you today.  *If you need a refill on your cardiac medications before your next appointment, please call your pharmacy*  Lab Work: None Ordered If you have labs (blood work) drawn today and your tests are completely normal, you will receive your results only by: [name] MyChart Message (if you have MyChart) OR . A paper copy in the mail If you have any lab test that is abnormal or we need to change your treatment, we will call you to review the results.   Testing/Procedures: None Ordered   Follow-Up: At [facility], you and your health needs are our priority.  As part of our continuing mission to provide you with exceptional heart care, we have created designated Provider Care Teams.  These Care Teams include your primary Cardiologist (physician) and Advanced Practice Providers (APPs -  Physician Assistants and Nurse Practitioners) who all work together to provide you with the care you need, when you need it.  Your next appointment:   6 month(s)  The format for your next appointment:   Either In Person or Virtual  Provider:   You may see Lauree Chandler, MD or one of the following Advanced Practice Providers on your designated Care Team:    Melina Copa, PA-C  Ermalinda Barrios, PA-C

## 2019-07-02 NOTE — Progress Notes (Signed)
Chief Complaint  Patient presents with  . Follow-up    CAD   History of Present Illness: 74 yo male with history of CAD s/p CABG in 1996 (LIMA-LAD, SVG-circumflex, SVG-RCA, SVG-diagonal) with redo bypass in 2007 (SVG-OM, SVG-RCA), ischemic cardiomyopathy, CKD, DM 2, HTN, HLD, renal artery stenosis, COPD, GERD, atrial fibrillation and PVCs here today for cardiac follow up. Echo in 2014 with normal LV systolic function. He was hospitalized at [facility] July 2016 with a NSTEMI. Cardiac cath with severe disease in the SVG to OM treated with a drug eluting stent. He was treated with Brilinta post PCI but developed dyspnea and was changed to Plavix. He was admitted to [facility] May 2017 with unstable angina and cardiac cath showed severe disease in the distal body/anastomosis of the SVG to Diagonal.  A drug eluting stent was placed in this vein graft. The LIMA to LAD was patent, SVG to OM was patent, SVG to PDA/PLA was patent. He was admitted to [facility] February 2018 with chest pain. Troponin was negative x 3. He did not have an ischemic evaluation. Echo February 2018 with  mild LV systolic dysfunction. LVEF=45-50%. I saw him in the office in January 2019 and he was volume overloaded. Lasix was started. He was admitted to [facility] February 2019 with unstable angina and cardiac cath showed patency of all of the bypass grafts. Echo February 2019 with normal LV systolic function, UMPN=36-14%, grade 1 diastolic dysfunction. He was admitted to [facility] January 2020 with atrial fib/flutter with RVR. Started on Xarelto and initially rate controlled with IV Cardizem. He was loaded with IV amiodarone and discharged home on po amiodarone and Lopressor (Coreg stopped during admission). His Plavix was stopped and Norvasc were stopped at discharge. His Norvasc was restarted in primary care.   He is here today for follow up. The patient denies any chest pain, palpitations, lower extremity edema, orthopnea, PND, dizziness, near syncope or  syncope. He has constipation. Baseline chronic dyspnea.    Primary Care Physician: [name]., MD  Past Medical History:  Diagnosis Date  . AAA (abdominal aortic aneurysm) ([place])    a. 3cm by [place] 2015.  [name] Arthritis    "hips; back" (12/13/2014)  . CAD (coronary artery disease) 2007   a. s/p CABG- IMA-LAD, VG-Cx, VG-RCA, VG-diag in 1999. B. sp redo CABG- VG-OM, VG-RCA in 2007 due to VG disease. c. NSTEMI 11/2014 s/p DES to SVG-OM from the Y graft.d. PTCA/DES x 1 distal body of SVG to Diagonal.09/2015  . Chronic combined systolic and diastolic CHF (congestive heart failure) ([place])    a. remote EF 40-45% in 2006. b. Normal EF 2014. b. Echo 07/2016 EF 45-50%, grade 1 DD.  [name] Chronic lower back pain   . CKD (chronic kidney disease), stage IV ([place])   . COPD (chronic obstructive pulmonary disease) ([place])   . Deafness in left ear   . Degenerative disc disease, lumbar   . Dilated cardiomyopathy ([place]) 10/07/2015  . Emphysema   . Esophageal stricture 07/02/1998   EGD  . GERD (gastroesophageal reflux disease)   . History of gout    "last flareup was in 2007" (12/13/2014)  . History of hiatal hernia   . Hyperlipidemia   . Hypertension   . Ischemic cardiomyopathy 2006   EF 40% to 50% by 2D echo in 2006;  Echo 12/31/12: Mild LVH, EF 50-55%, normal wall motion.   [name] PVC's (premature ventricular contractions)   . Renal artery stenosis ([place])  a. noted on CT 2008.  . Type II diabetes mellitus ([place])    Diet control   . Walking pneumonia 1990's    Past Surgical History:  Procedure Laterality Date  . CARDIAC CATHETERIZATION  "several"  . CARDIAC CATHETERIZATION N/A 12/13/2014   Procedure: Left Heart Cath and Coronary Angiography;  Surgeon: Jettie Booze, MD;  Location: [place] [facility];  Service: Cardiovascular;  Laterality: N/A;  . CARDIAC CATHETERIZATION  12/13/2014   Procedure: Coronary Stent Intervention;  Surgeon: Jettie Booze, MD;  Location: [place] [facility];  Service:  Cardiovascular;;  . CARDIAC CATHETERIZATION N/A 10/07/2015   Procedure: Left Heart Cath and Cors/Grafts Angiography;  Surgeon: Burnell Blanks, MD;  Location: [place] [facility];  Service: Cardiovascular;  Laterality: N/A;  . CARDIAC CATHETERIZATION N/A 10/07/2015   Procedure: Coronary Stent Intervention;  Surgeon: Burnell Blanks, MD;  Location: [place] [facility];  Service: Cardiovascular;  Laterality: N/A;  . CORONARY ANGIOPLASTY  "several"  . CORONARY ANGIOPLASTY WITH STENT PLACEMENT  2005; 12/13/2014   "2; 1"  . CORONARY ARTERY BYPASS GRAFT  1996   CABG X5  . CORONARY ARTERY BYPASS GRAFT  March 2007   CABG X3  . ESOPHAGOGASTRODUODENOSCOPY (EGD) WITH ESOPHAGEAL DILATION  2000  . GREEN LIGHT LASER TURP (TRANSURETHRAL RESECTION OF PROSTATE  2000's   "not cancerous"  . HERNIA REPAIR    . LAPAROSCOPIC CHOLECYSTECTOMY    . LEFT HEART CATH AND CORS/GRAFTS ANGIOGRAPHY N/A 07/28/2017   Procedure: LEFT HEART CATH AND CORS/GRAFTS ANGIOGRAPHY;  Surgeon: Troy Sine, MD;  Location: [place] [facility];  Service: Cardiovascular;  Laterality: N/A;  . LUNG SURGERY  1996   "S/P CABG, had to put staple in lung after it had collapsed"  . UMBILICAL HERNIA REPAIR     w/chole    Current Outpatient Medications  Medication Sig Dispense Refill  . allopurinol (ZYLOPRIM) 100 MG tablet TAKE 1 TABLET BY MOUTH AT  BEDTIME 90 tablet 3  . amiodarone (PACERONE) 200 MG tablet Take 1 tablet (200 mg total) by mouth daily. 90 tablet 2  . amLODipine (NORVASC) 10 MG tablet Take 1 tablet (10 mg total) by mouth daily. 90 tablet 3  . aspirin EC 81 MG tablet Take 1 tablet (81 mg total) by mouth daily. 90 tablet 3  . atorvastatin (LIPITOR) 40 MG tablet TAKE 1 TABLET BY MOUTH  DAILY 90 tablet 2  . Cholecalciferol (VITAMIN D-3) 125 MCG (5000 UT) TABS Take 1 tablet by mouth daily.    . furosemide (LASIX) 40 MG tablet Take 40 mg by mouth daily as needed for fluid or edema.   6  . isosorbide mononitrate (IMDUR) 30  MG 24 hr tablet TAKE 1 TABLET BY MOUTH  DAILY 90 tablet 2  . levothyroxine (SYNTHROID) 25 MCG tablet TAKE 1 TABLET BY MOUTH EVERY DAY BEFORE BREAKFAST 60 tablet 1  . loratadine (CLARITIN) 10 MG tablet Take 10 mg by mouth daily as needed for allergies.    . magnesium oxide (MAG-OX) 400 MG tablet TAKE 1 TABLET BY MOUTH TWICE A DAY 180 tablet 3  . Melatonin 5 MG TABS Take 1 tablet by mouth at bedtime.    . metoprolol tartrate (LOPRESSOR) 100 MG tablet TAKE 1 TABLET BY MOUTH  TWICE DAILY 180 tablet 2  . Multiple Vitamin (MULTIVITAMIN PO) Take 1 tablet by mouth daily.    . nitroGLYCERIN (NITROSTAT) 0.4 MG SL tablet Place 0.4 mg under the tongue every 5 (five) minutes as  needed for chest pain (Up to 3 times).    . pantoprazole (PROTONIX) 40 MG tablet Take 2 tablets (80 mg total) by mouth daily. (Patient taking differently: Take 40 mg by mouth 2 (two) times daily. ) 180 tablet 3  . Rivaroxaban (XARELTO) 15 MG TABS tablet Take 1 tablet (15 mg total) by mouth daily with supper. 30 tablet 11  . sodium bicarbonate 650 MG tablet Take 650 mg by mouth 2 (two) times daily. Evening     No current facility-administered medications for this visit.    Allergies  Allergen Reactions  . Predicort [Prednisolone] Other (See Comments)    Stomach pain  . Ciprofloxacin     GI upset  . Hydrochlorothiazide Other (See Comments)    Dehydration  . Hydrocodone     Stomach upset  . Hydrocodone-Acetaminophen     Stomach upset  . Hydrocodone-Acetaminophen Nausea Only  . Loratadine Other (See Comments)    Other reaction(s): Unknown  . Sulfa Antibiotics Other (See Comments)    Cannot recall  . Sulfacetamide Sodium Other (See Comments)    Cannot recall  . Sulfasalazine     Other reaction(s): Other (See Comments) Cannot recall  . Hydrocodone-Acetaminophen Nausea Only  . Penicillins Hives and Rash    Has patient had a PCN reaction causing immediate rash, facial/tongue/throat swelling, SOB or lightheadedness with  hypotension: YES Has patient had a PCN reaction causing severe rash involving mucus membranes or skin necrosis: NO Has patient had a PCN reaction that required hospitalization NO Has patient had a PCN reaction occurring within the last 10 years:NO If all of the above answers are "NO", then may proceed with Cephalosporin use.    Social History   Socioeconomic History  . Marital status: Married    Spouse name: Not on file  . Number of children: 2  . Years of education: Not on file  . Highest education level: 8th grade  Occupational History  . Occupation: Retired  Tobacco Use  . Smoking status: Former Smoker    Packs/day: 3.00    Years: 20.00    Pack years: 60.00    Types: Cigarettes    Quit date: 07/25/1986    Years since quitting: 32.9  . Smokeless tobacco: Never Used  Substance and Sexual Activity  . Alcohol use: No    Alcohol/week: 0.0 standard drinks  . Drug use: No  . Sexual activity: Not Currently  Other Topics Concern  . Not on file  Social History Narrative   Did auto salvage work.   Lives at home with his wife.  Independent at baseline.   Social Determinants of Health   Financial Resource Strain:   . Difficulty of Paying Living Expenses: Not on file  Food Insecurity:   . Worried About Charity fundraiser in the Last Year: Not on file  . Ran Out of Food in the Last Year: Not on file  Transportation Needs:   . Lack of Transportation (Medical): Not on file  . Lack of Transportation (Non-Medical): Not on file  Physical Activity:   . Days of Exercise per Week: Not on file  . Minutes of Exercise per Session: Not on file  Stress:   . Feeling of Stress : Not on file  Social Connections:   . Frequency of Communication with Friends and Family: Not on file  . Frequency of Social Gatherings with Friends and Family: Not on file  . Attends Religious Services: Not on file  . Active Member of Clubs  or Organizations: Not on file  . Attends Archivist Meetings:  Not on file  . Marital Status: Not on file  Intimate Partner Violence:   . Fear of Current or Ex-Partner: Not on file  . Emotionally Abused: Not on file  . Physically Abused: Not on file  . Sexually Abused: Not on file    Family History  Problem Relation Age of Onset  . Heart attack Mother        MI  . Stroke Mother   . Heart disease Mother   . Hypertension Mother   . Hyperlipidemia Mother   . Heart disease Father   . Rheumatic fever Father   . Colon cancer Neg Hx     Review of Systems:  As stated in the HPI and otherwise negative.   BP (!) 164/70   Pulse 62   Ht 5\' 10"  (1.778 m)   Wt 262 lb (118.8 kg)   SpO2 95%   BMI 37.59 kg/m   Physical Examination:  General: Well developed, well nourished, NAD  HEENT: OP clear, mucus membranes moist  SKIN: warm, dry. No rashes. Neuro: No focal deficits  Musculoskeletal: Muscle strength 5/5 all ext  Psychiatric: Mood and affect normal  Neck: No JVD, no carotid bruits, no thyromegaly, no lymphadenopathy.  Lungs:Clear bilaterally, no wheezes, rhonci, crackles Cardiovascular: Regular rate and rhythm. No murmurs, gallops or rubs. Abdomen:Soft. Bowel sounds present. Non-tender.  Extremities: No lower extremity edema. Pulses are 2 + in the bilateral DP/PT.  Echo 07/14/17: - Left ventricle: The cavity size was mildly dilated. Systolic   function was normal. The estimated ejection fraction was in the   range of 55% to 60%. Doppler parameters are consistent with   abnormal left ventricular relaxation (grade 1 diastolic   dysfunction).  Cardiac cath February 2019: Significant native CAD with total occlusion of the LAD after the takeoff of the first diagonal vessel; total occlusion of the proximal left circumflex coronary artery; and total occlusion of the proximal RCA with antegrade bridging collaterals. Patent LIMA to LAD. Patent Y vein graft from the 1996 surgery which supplies the diagonal vessel and distal circumflex marginal  vessel.  There is diffuse narrowing of 50% in the midportion of the Y graft supplying the diagonal vessel and a patent distal stent extending to the ostium of the diagonal.  The Y graft supplying the distal marginal is free of significant disease and has mild luminal irregularity. Patent SVG supplying the distal RCA from the 2007 surgery with a stent in the proximal portion of the graft with 30% narrowings in the proximal and mid segment. LVEDP 18 mm  Diagnostic Diagram        EKG:  EKG is ordered today. The ekg ordered today demonstrates NSR, PVCs  Recent Labs: 05/01/2019: ALT 35; BUN 31; Creatinine, Ser 2.59; Hemoglobin 12.6; Platelets 147; Potassium 4.8; Sodium 143; TSH 20.600   Lipid Panel    Component Value Date/Time   CHOL 171 05/01/2019 0859   TRIG 96 05/01/2019 0859   HDL 52 05/01/2019 0859   CHOLHDL 3.3 05/01/2019 0859   CHOLHDL 4.5 12/13/2014 0423   VLDL 25 12/13/2014 0423   LDLCALC 101 (H) 05/01/2019 0859     Wt Readings from Last 3 Encounters:  07/02/19 262 lb (118.8 kg)  04/30/19 256 lb (116.1 kg)  03/21/19 253 lb (114.8 kg)     Other studies Reviewed: Additional studies/ records that were reviewed today include: . Review of the above records demonstrates:  Assessment and Plan:   1. CAD s/p CABG with stable angina: Cardiac cath February 2019 with 4 patent vein grafts. He has no chest pain. Will continue ASA, statin, beta blocker and Imdur.        2. Ischemic Cardiomyopathy: LVEF =35% by echo at [facility] during his admission with atrial fib. No Ace-inh or ARB due to CKD. Will continue beta blocker.   3. Hypertension: BP is controlled.   4. Hyperlipidemia: Lipids followed in primary care. Will continue statin.   5. CKD: Followed by Nephrology. Ace-inh stopped due to renal insufficiency.   6. Chronic systolic CHF: No volume overload on exam. Continue Lasix as needed.   7. PVCs/Bigeminy: He has no palpitations. Continue beta blocker.   8. AAA: stable by  u/s February 2019. Will repeat in 2021.   9. Atrial fibrillation, paroxysmal: Sinus today. Continue Lopressor, amiodarone and Xarelto. CHADS VASC score 5.   10. Sleep apnea: he is on CPAP.  Current medicines are reviewed at length with the patient today.  The patient does not have concerns regarding medicines.  The following changes have been made:  no change  Labs/ tests ordered today include:   No orders of the defined types were placed in this encounter.   Disposition:   FU with me in 6 months.    Signed, Lauree Chandler, MD 07/02/2019 4:27 PM    [facility] [address], [place], [place]  [address] Phone: [phone]; Fax: [phone]

## 2019-07-03 ENCOUNTER — Ambulatory Visit: Payer: Self-pay | Admitting: Family Medicine

## 2019-07-14 DIAGNOSIS — G4733 Obstructive sleep apnea (adult) (pediatric): Secondary | ICD-10-CM | POA: Diagnosis not present

## 2019-07-18 ENCOUNTER — Other Ambulatory Visit: Payer: Self-pay | Admitting: Family Medicine

## 2019-07-27 DIAGNOSIS — J300 Vasomotor rhinitis: Secondary | ICD-10-CM | POA: Diagnosis not present

## 2019-07-27 DIAGNOSIS — S01309A Unspecified open wound of unspecified ear, initial encounter: Secondary | ICD-10-CM | POA: Diagnosis not present

## 2019-07-29 ENCOUNTER — Other Ambulatory Visit: Payer: Self-pay | Admitting: Cardiovascular Disease

## 2019-07-30 NOTE — Telephone Encounter (Signed)
Prescription refill request for Xarelto received.   Last office visit: [place], 07/03/2019 Weight: 118.8 kg Age: 74 y.o. Scr:  2.59, 05/01/2019 CrCl:41 ml/min   Prescription refill sent.

## 2019-08-01 ENCOUNTER — Encounter: Payer: Self-pay | Admitting: Family Medicine

## 2019-08-01 ENCOUNTER — Other Ambulatory Visit: Payer: Self-pay

## 2019-08-01 ENCOUNTER — Ambulatory Visit (INDEPENDENT_AMBULATORY_CARE_PROVIDER_SITE_OTHER): Payer: Medicare Other | Admitting: Family Medicine

## 2019-08-01 VITALS — BP 161/74 | HR 58 | Temp 97.3°F | Resp 18 | Wt 260.0 lb

## 2019-08-01 DIAGNOSIS — I4891 Unspecified atrial fibrillation: Secondary | ICD-10-CM | POA: Diagnosis not present

## 2019-08-01 DIAGNOSIS — N184 Chronic kidney disease, stage 4 (severe): Secondary | ICD-10-CM

## 2019-08-01 DIAGNOSIS — R739 Hyperglycemia, unspecified: Secondary | ICD-10-CM

## 2019-08-01 DIAGNOSIS — G4733 Obstructive sleep apnea (adult) (pediatric): Secondary | ICD-10-CM

## 2019-08-01 DIAGNOSIS — I10 Essential (primary) hypertension: Secondary | ICD-10-CM

## 2019-08-01 DIAGNOSIS — Z6834 Body mass index (BMI) 34.0-34.9, adult: Secondary | ICD-10-CM

## 2019-08-01 DIAGNOSIS — E66811 Obesity, class 1: Secondary | ICD-10-CM

## 2019-08-01 DIAGNOSIS — E6609 Other obesity due to excess calories: Secondary | ICD-10-CM

## 2019-08-01 DIAGNOSIS — E039 Hypothyroidism, unspecified: Secondary | ICD-10-CM | POA: Diagnosis not present

## 2019-08-01 NOTE — Progress Notes (Signed)
Patient: [name] Male    DOB: 12/14/1945   74 y.o.   MRN: [id number] Visit Date: 08/01/2019  Today's Provider: [name], MD   Chief Complaint  Patient presents with  . Diabetes  . Hypothyroidism   Subjective:     HPI   Essential hypertension From 04/30/2019-Presently controlled. Labs checked showing-okay.   Atrial fibrillation, unspecified type ([place]) From 04/30/2019-On Xarelto. Labs checked showing-okay.   Coronary artery disease involving native coronary artery of native heart with unstable angina pectoris ([place]) From 04/30/2019-All risk factors treated  OSA (obstructive sleep apnea) From 04/30/2019-On CPAP.  Diabetes mellitus with nephropathy ([place]) From 04/30/2019-A1c today is 5.7.  Very good control.   Pure hypercholesterolemia From 04/30/2019-On atorvastatin. Labs checked showing-okay.   Hypothyroidism From 04/30/2019-labs checked showing-okay. Started levothyroxine 25 mcg qd.  Trochanteric bursitis on right From 04/30/2019-Consider referring to orthopedics for consideration of injection.  He cannot take nonsteroidals due to anticoagulation and chronic kidney disease.   Allergies  Allergen Reactions  . Predicort [Prednisolone] Other (See Comments)    Stomach pain  . Ciprofloxacin     GI upset  . Hydrochlorothiazide Other (See Comments)    Dehydration  . Hydrocodone     Stomach upset  . Hydrocodone-Acetaminophen     Stomach upset  . Hydrocodone-Acetaminophen Nausea Only  . Loratadine Other (See Comments)    Other reaction(s): Unknown  . Sulfa Antibiotics Other (See Comments)    Cannot recall  . Sulfacetamide Sodium Other (See Comments)    Cannot recall  . Sulfasalazine     Other reaction(s): Other (See Comments) Cannot recall  . Hydrocodone-Acetaminophen Nausea Only  . Penicillins Hives and Rash    Has patient had a PCN reaction causing immediate rash, facial/tongue/throat swelling, SOB or lightheadedness with  hypotension: YES Has patient had a PCN reaction causing severe rash involving mucus membranes or skin necrosis: NO Has patient had a PCN reaction that required hospitalization NO Has patient had a PCN reaction occurring within the last 10 years:NO If all of the above answers are "NO", then may proceed with Cephalosporin use.     Current Outpatient Medications:  .  allopurinol (ZYLOPRIM) 100 MG tablet, TAKE 1 TABLET BY MOUTH AT  BEDTIME, Disp: 90 tablet, Rfl: 3 .  amiodarone (PACERONE) 200 MG tablet, Take 1 tablet (200 mg total) by mouth daily., Disp: 90 tablet, Rfl: 2 .  amLODipine (NORVASC) 10 MG tablet, Take 1 tablet (10 mg total) by mouth daily., Disp: 90 tablet, Rfl: 3 .  aspirin EC 81 MG tablet, Take 1 tablet (81 mg total) by mouth daily., Disp: 90 tablet, Rfl: 3 .  atorvastatin (LIPITOR) 40 MG tablet, TAKE 1 TABLET BY MOUTH  DAILY, Disp: 90 tablet, Rfl: 2 .  Cholecalciferol (VITAMIN D-3) 125 MCG (5000 UT) TABS, Take 1 tablet by mouth daily., Disp: , Rfl:  .  furosemide (LASIX) 40 MG tablet, Take 40 mg by mouth daily as needed for fluid or edema. , Disp: , Rfl: 6 .  isosorbide mononitrate (IMDUR) 30 MG 24 hr tablet, TAKE 1 TABLET BY MOUTH  DAILY, Disp: 90 tablet, Rfl: 2 .  levothyroxine (SYNTHROID) 25 MCG tablet, TAKE 1 TABLET BY MOUTH EVERY DAY BEFORE BREAKFAST, Disp: 60 tablet, Rfl: 1 .  loratadine (CLARITIN) 10 MG tablet, Take 10 mg by mouth daily as needed for allergies., Disp: , Rfl:  .  magnesium oxide (MAG-OX) 400 MG tablet, TAKE 1 TABLET BY MOUTH TWICE A DAY, Disp:  180 tablet, Rfl: 3 .  Melatonin 5 MG TABS, Take 1 tablet by mouth at bedtime., Disp: , Rfl:  .  metoprolol tartrate (LOPRESSOR) 100 MG tablet, TAKE 1 TABLET BY MOUTH  TWICE DAILY, Disp: 180 tablet, Rfl: 2 .  Multiple Vitamin (MULTIVITAMIN PO), Take 1 tablet by mouth daily., Disp: , Rfl:  .  nitroGLYCERIN (NITROSTAT) 0.4 MG SL tablet, Place 0.4 mg under the tongue every 5 (five) minutes as needed for chest pain (Up to 3  times)., Disp: , Rfl:  .  pantoprazole (PROTONIX) 40 MG tablet, TAKE 2 TABLETS BY MOUTH  DAILY, Disp: 60 tablet, Rfl: 1 .  sodium bicarbonate 650 MG tablet, Take 650 mg by mouth 2 (two) times daily. Evening, Disp: , Rfl:  .  XARELTO 15 MG TABS tablet, TAKE 1 TABLET (15 MG TOTAL) BY MOUTH DAILY WITH SUPPER., Disp: 30 tablet, Rfl: 5  Review of Systems  Constitutional: Negative for appetite change, chills and fever.  HENT: Negative.   Eyes: Negative.   Respiratory: Negative for chest tightness, shortness of breath and wheezing.   Cardiovascular: Negative for chest pain and palpitations.  Gastrointestinal: Negative for abdominal pain, nausea and vomiting.  Endocrine: Negative.   Genitourinary: Negative.   Allergic/Immunologic: Negative.   Neurological: Negative.   Psychiatric/Behavioral: Negative.     Social History   Tobacco Use  . Smoking status: Former Smoker    Packs/day: 3.00    Years: 20.00    Pack years: 60.00    Types: Cigarettes    Quit date: 07/25/1986    Years since quitting: 33.0  . Smokeless tobacco: Never Used  Substance Use Topics  . Alcohol use: No    Alcohol/week: 0.0 standard drinks      Objective:   BP (!) 161/74 (BP Location: Left Arm, Cuff Size: Large)   Pulse (!) 58   Temp (!) 97.3 F (36.3 C) (Temporal)   Resp 18   Wt 260 lb (117.9 kg)   SpO2 95% Comment: room air  BMI 37.31 kg/m  Vitals:   08/01/19 1355 08/01/19 1357  BP: (!) 167/70 (!) 161/74  Pulse: (!) 58   Resp: 18   Temp: (!) 97.3 F (36.3 C)   TempSrc: Temporal   SpO2: 95%   Weight: 260 lb (117.9 kg)   Body mass index is 37.31 kg/m.   Physical Exam Vitals reviewed.  Constitutional:      Appearance: He is well-developed. He is obese.  HENT:     Head: Normocephalic and atraumatic.     Right Ear: External ear normal.     Left Ear: External ear normal.     Nose: Nose normal.  Eyes:     General: No scleral icterus.    Conjunctiva/sclera: Conjunctivae normal.  Neck:      Thyroid: No thyromegaly.  Cardiovascular:     Rate and Rhythm: Normal rate and regular rhythm.     Heart sounds: Normal heart sounds.  Pulmonary:     Effort: Pulmonary effort is normal.     Breath sounds: Normal breath sounds.  Abdominal:     Palpations: Abdomen is soft.     Comments: Trunk obesity  Musculoskeletal:     Right lower leg: No edema.     Left lower leg: No edema.  Skin:    General: Skin is warm and dry.  Neurological:     Mental Status: He is alert and oriented to person, place, and time. Mental status is at baseline.  Psychiatric:  Mood and Affect: Mood normal.        Behavior: Behavior normal.        Thought Content: Thought content normal.        Judgment: Judgment normal.      No results found for any visits on 08/01/19.     Assessment & Plan    1. Hypothyroidism, unspecified type Will recheck TSH - TSH  2. Hyperglycemia - HgB A1c Return to clinic 2 months  3. OSA (obstructive sleep apnea)  4. Atrial fibrillation, unspecified type (HCC)  5. Class 1 obesity due to excess calories with serious comorbidity and body mass index (BMI) of 34.0 to 34.9 in adult  6.CKD 7.  Essential hypertension   I,Roshena L Chambers,acting as a scribe for [name], MD.,have documented all relevant documentation on the behalf of [name], MD,as directed by  [name], MD while in the presence of [name], MD.   [name], MD  [facility]

## 2019-08-02 ENCOUNTER — Encounter: Payer: Self-pay | Admitting: Family Medicine

## 2019-08-02 LAB — HEMOGLOBIN A1C
Est. average glucose Bld gHb Est-mCnc: 123 mg/dL
Hgb A1c MFr Bld: 5.9 % — ABNORMAL HIGH (ref 4.8–5.6)

## 2019-08-02 LAB — TSH: TSH: 15.2 u[IU]/mL — ABNORMAL HIGH (ref 0.450–4.500)

## 2019-08-03 ENCOUNTER — Other Ambulatory Visit: Payer: Self-pay | Admitting: *Deleted

## 2019-08-03 DIAGNOSIS — E039 Hypothyroidism, unspecified: Secondary | ICD-10-CM

## 2019-08-03 MED ORDER — LEVOTHYROXINE SODIUM 50 MCG PO TABS: 50.0000 ug | ORAL_TABLET | Freq: Every day | ORAL | 3 refills | Status: DC

## 2019-08-06 ENCOUNTER — Other Ambulatory Visit: Payer: Self-pay | Admitting: Family Medicine

## 2019-08-06 MED ORDER — PANTOPRAZOLE SODIUM 40 MG PO TBEC: 80.0000 mg | DELAYED_RELEASE_TABLET | Freq: Every day | ORAL | 3 refills | Status: DC

## 2019-08-06 NOTE — Telephone Encounter (Signed)
[facility] faxed refill request for the following medications:  pantoprazole (PROTONIX) 40 MG tablet    Please advise.  Thanks, [employer]

## 2019-08-09 DIAGNOSIS — N184 Chronic kidney disease, stage 4 (severe): Secondary | ICD-10-CM | POA: Diagnosis not present

## 2019-08-17 ENCOUNTER — Other Ambulatory Visit: Payer: Self-pay

## 2019-08-17 ENCOUNTER — Ambulatory Visit (INDEPENDENT_AMBULATORY_CARE_PROVIDER_SITE_OTHER): Payer: Medicare Other

## 2019-08-17 DIAGNOSIS — I714 Abdominal aortic aneurysm, without rupture, unspecified: Secondary | ICD-10-CM

## 2019-08-21 ENCOUNTER — Telehealth: Payer: Self-pay

## 2019-08-21 DIAGNOSIS — I714 Abdominal aortic aneurysm, without rupture, unspecified: Secondary | ICD-10-CM

## 2019-08-21 NOTE — Telephone Encounter (Signed)
-----   Message from Charlie Pitter, [place] sent at 08/20/2019  8:32 AM EDT ----- Agree with recommendation for f/u study in 12 months, will route to Cong Hightower since she was routed original result note Gae Bon was covering my inbox). Aneurysm has increased but only slightly - has varied from 2.8-3.0cm over the years, at 3.1cm on this specific study. Please arrange repeat 12 months

## 2019-09-17 DIAGNOSIS — I701 Atherosclerosis of renal artery: Secondary | ICD-10-CM | POA: Diagnosis not present

## 2019-09-17 DIAGNOSIS — I10 Essential (primary) hypertension: Secondary | ICD-10-CM | POA: Diagnosis not present

## 2019-09-17 DIAGNOSIS — N184 Chronic kidney disease, stage 4 (severe): Secondary | ICD-10-CM | POA: Diagnosis not present

## 2019-09-17 DIAGNOSIS — N2581 Secondary hyperparathyroidism of renal origin: Secondary | ICD-10-CM | POA: Diagnosis not present

## 2019-09-17 DIAGNOSIS — R600 Localized edema: Secondary | ICD-10-CM | POA: Diagnosis not present

## 2019-09-18 ENCOUNTER — Other Ambulatory Visit: Payer: Self-pay | Admitting: Family Medicine

## 2019-09-18 NOTE — Telephone Encounter (Signed)
Requested Prescriptions  Pending Prescriptions Disp Refills  . allopurinol (ZYLOPRIM) 100 MG tablet [Pharmacy Med Name: ALLOPURINOL  100MG   TAB] 90 tablet 0    Sig: TAKE 1 TABLET BY MOUTH AT  BEDTIME     Endocrinology:  Gout Agents Failed - 09/18/2019  4:56 AM      Failed - Uric Acid in normal range and within 360 days    No results found for: POCURA, LABURIC       Failed - Cr in normal range and within 360 days    Creat  Date Value Ref Range Status  11/24/2015 2.44 (H) 0.70 - 1.18 mg/dL Final    Comment:      For patients > or = 76 years of age: The upper reference limit for Creatinine is approximately 13% higher for people identified as African-American.      Creatinine, Ser  Date Value Ref Range Status  05/01/2019 2.59 (H) 0.76 - 1.27 mg/dL Final         Passed - Valid encounter within last 12 months    Recent Outpatient Visits          1 month ago Hypothyroidism, unspecified type   [facility] [name]., MD   4 months ago Annual physical exam   [facility] [name]., MD   6 months ago Diabetes mellitus with nephropathy [facility])   [facility] [name]., MD   10 months ago CKD (chronic kidney disease) stage 3, GFR 30-59 ml/min [facility])   [facility] [name]., MD   1 year ago Atrial fibrillation, unspecified type [facility])   [facility] [name]., MD      Future Appointments            In 3 weeks [name]., MD [facility]

## 2019-10-04 NOTE — Progress Notes (Signed)
This encounter was created in error - please disregard.

## 2019-10-10 ENCOUNTER — Other Ambulatory Visit: Payer: Self-pay

## 2019-10-10 ENCOUNTER — Other Ambulatory Visit: Payer: Self-pay | Admitting: Family Medicine

## 2019-10-10 ENCOUNTER — Encounter: Payer: Medicare Other | Admitting: Family Medicine

## 2019-10-10 ENCOUNTER — Ambulatory Visit (INDEPENDENT_AMBULATORY_CARE_PROVIDER_SITE_OTHER): Payer: Medicare Other | Admitting: Family Medicine

## 2019-10-10 DIAGNOSIS — R600 Localized edema: Secondary | ICD-10-CM | POA: Diagnosis not present

## 2019-10-10 DIAGNOSIS — N2581 Secondary hyperparathyroidism of renal origin: Secondary | ICD-10-CM

## 2019-10-10 DIAGNOSIS — E039 Hypothyroidism, unspecified: Secondary | ICD-10-CM

## 2019-10-10 DIAGNOSIS — R062 Wheezing: Secondary | ICD-10-CM | POA: Diagnosis not present

## 2019-10-10 DIAGNOSIS — R0602 Shortness of breath: Secondary | ICD-10-CM | POA: Diagnosis not present

## 2019-10-10 DIAGNOSIS — G4733 Obstructive sleep apnea (adult) (pediatric): Secondary | ICD-10-CM

## 2019-10-10 DIAGNOSIS — I4891 Unspecified atrial fibrillation: Secondary | ICD-10-CM

## 2019-10-10 DIAGNOSIS — N184 Chronic kidney disease, stage 4 (severe): Secondary | ICD-10-CM | POA: Diagnosis not present

## 2019-10-10 DIAGNOSIS — E6609 Other obesity due to excess calories: Secondary | ICD-10-CM

## 2019-10-10 DIAGNOSIS — Z6834 Body mass index (BMI) 34.0-34.9, adult: Secondary | ICD-10-CM

## 2019-10-10 NOTE — Progress Notes (Signed)
.     Established patient visit   I,Ronald [name],acting as a scribe for Ronald Durie, [name].,have documented all relevant documentation on the behalf of Ronald Durie, [name],as directed by  Ronald Durie, [name] while in the presence of Ronald Durie, [name].   Patient: Ronald [name] [facility]   DOB: Sep 18, 1945   74 y.o. Male  MRN: [id number] Visit Date: 10/10/2019  Today's healthcare provider: [name], [name]   No chief complaint on file.  Subjective    HPI Patient comes in today for follow-up.  Everything is stable. He has 1 chronic complaint which is very stable.  He is unable to take deep breaths.  He says it particularly bothers him when he lays down at night.  He wears a CPAP at night.  He does not have any PND or any other symptoms of heart failure. Hyperparathyroidism due to renal insufficiency Patient was last seen in March 2021.  His Levothyroxine was increased from 85mcg to 46mcg.  TSH was 15.  Lab Results  Component Value Date   TSH 15.200 (H) 08/01/2019    Medications: Outpatient Medications Prior to Visit  Medication Sig  . allopurinol (ZYLOPRIM) 100 MG tablet TAKE 1 TABLET BY MOUTH AT  BEDTIME  . amiodarone (PACERONE) 200 MG tablet Take 1 tablet (200 mg total) by mouth daily.  [name] amLODipine (NORVASC) 10 MG tablet Take 1 tablet (10 mg total) by mouth daily.  [name] aspirin EC 81 MG tablet Take 1 tablet (81 mg total) by mouth daily.  [name] atorvastatin (LIPITOR) 40 MG tablet TAKE 1 TABLET BY MOUTH  DAILY  . Cholecalciferol (VITAMIN D-3) 125 MCG (5000 UT) TABS Take 1 tablet by mouth daily.  . furosemide (LASIX) 40 MG tablet Take 40 mg by mouth daily as needed for fluid or edema.   . isosorbide mononitrate (IMDUR) 30 MG 24 hr tablet TAKE 1 TABLET BY MOUTH  DAILY  . levothyroxine (SYNTHROID) 50 MCG tablet Take 1 tablet (50 mcg total) by mouth daily.  [name] loratadine (CLARITIN) 10 MG tablet Take 10 mg by mouth daily as needed for allergies.  . magnesium oxide (MAG-OX)  400 MG tablet TAKE 1 TABLET BY MOUTH TWICE A DAY  . Melatonin 5 MG TABS Take 1 tablet by mouth at bedtime.  . metoprolol tartrate (LOPRESSOR) 100 MG tablet TAKE 1 TABLET BY MOUTH  TWICE DAILY  . Multiple Vitamin (MULTIVITAMIN PO) Take 1 tablet by mouth daily.  . nitroGLYCERIN (NITROSTAT) 0.4 MG SL tablet Place 0.4 mg under the tongue every 5 (five) minutes as needed for chest pain (Up to 3 times).  . pantoprazole (PROTONIX) 40 MG tablet Take 2 tablets (80 mg total) by mouth daily.  . sodium bicarbonate 650 MG tablet Take 650 mg by mouth 2 (two) times daily. Evening  . XARELTO 15 MG TABS tablet TAKE 1 TABLET (15 MG TOTAL) BY MOUTH DAILY WITH SUPPER.   No facility-administered medications prior to visit.    Review of Systems  Constitutional: Negative.   HENT: Negative.   Eyes: Negative.   Respiratory: Positive for shortness of breath (chronic). Negative for chest tightness.   Cardiovascular: Negative for chest pain, palpitations and leg swelling.  Gastrointestinal: Negative.   Endocrine: Negative.   Allergic/Immunologic: Negative.   Neurological: Negative for dizziness and headaches.  Hematological: Negative.   Psychiatric/Behavioral: Negative.     Last thyroid functions Lab Results  Component Value Date   TSH 8.940 (H) 10/10/2019  Objective    There were no vitals taken for this visit. BP Readings from Last 3 Encounters:  08/01/19 (!) 161/74  07/02/19 (!) 164/70  04/30/19 130/60   Wt Readings from Last 3 Encounters:  08/01/19 260 lb (117.9 kg)  07/02/19 262 lb (118.8 kg)  04/30/19 256 lb (116.1 kg)      Physical Exam Constitutional:      Appearance: He is obese.  HENT:     Head: Normocephalic and atraumatic.  Cardiovascular:     Rate and Rhythm: Normal rate and regular rhythm.     Pulses: Normal pulses.     Heart sounds: Normal heart sounds.  Pulmonary:     Effort: Pulmonary effort is normal.     Breath sounds: Normal breath sounds.  Abdominal:      Palpations: Abdomen is soft.  Musculoskeletal:     Right lower leg: Edema (trace) present.     Left lower leg: Edema (trace) present.  Neurological:     General: No focal deficit present.     Mental Status: He is alert and oriented to person, place, and time. Mental status is at baseline.  Psychiatric:        Mood and Affect: Mood normal.        Behavior: Behavior normal.        Thought Content: Thought content normal.        Judgment: Judgment normal.       No results found for any visits on 10/10/19.  Assessment & Plan     1. Hypothyroidism, unspecified type Recheck TSH  2. Hyperparathyroidism due to renal insufficiency (HCC) Also followed by nephrology - TSH  3. CKD (chronic kidney disease), stage IV ([place])  - Renal function panel  4. Shortness of breath I think this is significant truncal obesity causing his dyspnea at night.  Check this to rule out any significant CHF. - Pro b natriuretic peptide (BNP)[facility])  5. Pedal edema/orthopnea  - Pro b natriuretic peptide (BNP)[facility])  6. Class 1 obesity due to excess calories with serious comorbidity and body mass index (BMI) of 34.0 to 34.9 in adult Patient advised to lose weight.  7. OSA (obstructive sleep apnea) He wears CPAP at night.  8. Atrial fibrillation, unspecified type ([place])    No follow-ups on file.      I, Ronald Durie, [name], have reviewed all documentation for this visit. The documentation on 10/11/19 for the exam, diagnosis, procedures, and orders are all accurate and complete.    Ronald [name]  [facility] [phone] (phone) [phone] (fax)  [place]

## 2019-10-11 ENCOUNTER — Telehealth: Payer: Self-pay

## 2019-10-11 LAB — TSH: TSH: 8.94 u[IU]/mL — ABNORMAL HIGH (ref 0.450–4.500)

## 2019-10-11 LAB — RENAL FUNCTION PANEL
Albumin: 4.2 g/dL (ref 3.7–4.7)
BUN/Creatinine Ratio: 10 (ref 10–24)
BUN: 28 mg/dL — ABNORMAL HIGH (ref 8–27)
CO2: 22 mmol/L (ref 20–29)
Calcium: 9.5 mg/dL (ref 8.6–10.2)
Chloride: 109 mmol/L — ABNORMAL HIGH (ref 96–106)
Creatinine, Ser: 2.67 mg/dL — ABNORMAL HIGH (ref 0.76–1.27)
GFR calc Af Amer: 26 mL/min/{1.73_m2} — ABNORMAL LOW (ref >59)
GFR calc non Af Amer: 22 mL/min/{1.73_m2} — ABNORMAL LOW (ref >59)
Glucose: 122 mg/dL — ABNORMAL HIGH (ref 65–99)
Phosphorus: 2.8 mg/dL (ref 2.8–4.1)
Potassium: 5.3 mmol/L — ABNORMAL HIGH (ref 3.5–5.2)
Sodium: 144 mmol/L (ref 134–144)

## 2019-10-11 LAB — PRO B NATRIURETIC PEPTIDE: NT-Pro BNP: 1020 pg/mL — ABNORMAL HIGH (ref 0–486)

## 2019-10-11 NOTE — Telephone Encounter (Signed)
Copied from [place] [id number]. Topic: General - Inquiry >> Oct 11, 2019  9:37 AM Lennox Solders wrote: Reason for CRM:pt wife is calling to talk with pharmacist ted concerning getting more xarelto . Pt can not afford medication   Please advise Clare Gandy, thank you.

## 2019-10-14 ENCOUNTER — Encounter: Payer: Self-pay | Admitting: Family Medicine

## 2019-10-15 ENCOUNTER — Other Ambulatory Visit: Payer: Self-pay | Admitting: *Deleted

## 2019-10-15 ENCOUNTER — Telehealth: Payer: Self-pay

## 2019-10-15 DIAGNOSIS — E039 Hypothyroidism, unspecified: Secondary | ICD-10-CM

## 2019-10-15 MED ORDER — LEVOTHYROXINE SODIUM 75 MCG PO TABS: 75.0000 ug | ORAL_TABLET | Freq: Every day | ORAL | 3 refills | Status: DC

## 2019-10-15 NOTE — Telephone Encounter (Signed)
-----   Message from [name]., MD sent at 10/14/2019 10:35 AM EDT ----- Labs stable.  Increase Synthroid from 50 to 75 mcg daily

## 2019-10-15 NOTE — Telephone Encounter (Signed)
Patient advised on labs through [place] and prescription sent to pharmacy.

## 2019-10-20 ENCOUNTER — Other Ambulatory Visit (HOSPITAL_COMMUNITY): Payer: Self-pay | Admitting: Nurse Practitioner

## 2019-10-30 ENCOUNTER — Ambulatory Visit: Payer: Self-pay | Admitting: Family Medicine

## 2019-11-05 ENCOUNTER — Telehealth: Payer: Self-pay | Admitting: Family Medicine

## 2019-11-05 NOTE — Telephone Encounter (Signed)
Wife still waiting for a call from the pharmacist Ted.  Pt needs assistance with the  XARELTO 15 MG TABS tablet   Message never routed to the pharmacist.

## 2019-11-05 NOTE — Chronic Care Management (AMB) (Signed)
  Care Management   Note  11/05/2019 Name: Mohab Ashby [facility] MRN: [id number] DOB: 1946/04/22  Ronald [name] is a 74 y.o. year old male who is a primary care patient of [name]., MD and is actively engaged with the care management team. I reached out to [facility] by phone today to assist with scheduling an initial visit with the Pharmacist.    Follow up plan: Unsuccessful telephone outreach attempt made. A HIPPA compliant phone message was left for the patient providing contact information and requesting a return call. The care management team will reach out to the patient again over the next 7 days. If patient returns call to provider office, please advise to call [place] at [phone].  [place], [place] [address] Direct Dial: [phone] [email or web address] Website: [email or web address]

## 2019-11-05 NOTE — Chronic Care Management (AMB) (Signed)
  Chronic Care Management   Note  11/05/2019 Name: Ronald [name] [facility] MRN: [id number] DOB: [birth year]  Ronald [name] is a 74 y.o. year old male who is a primary care patient of [name]., MD. I reached out to [place] by phone today in response to a referral sent by Ronald [name] [facility] health plan.     Ronald [name] was given information about Chronic Care Management services today including:  1. CCM service includes personalized support from designated clinical staff supervised by his physician, including individualized plan of care and coordination with other care providers 2. 24/7 contact phone numbers for assistance for urgent and routine care needs. 3. Service will only be billed when office clinical staff spend 20 minutes or more in a month to coordinate care. 4. Only one practitioner may furnish and bill the service in a calendar month. 5. The patient may stop CCM services at any time (effective at the end of the month) by phone call to the office staff. 6. The patient will be responsible for cost sharing (co-pay) of up to 20% of the service fee (after annual deductible is met).  Patient agreed to services and verbal consent obtained.   Follow up plan: Telephone appointment with care management team member scheduled for:12/07/2019  [place], [place], [place] [address] Direct Dial: [email or web address] Website: [email or web address]

## 2019-11-15 ENCOUNTER — Telehealth: Payer: Self-pay

## 2019-11-15 NOTE — Telephone Encounter (Signed)
Form for Patient Assistance medication for Xarelto sent back.   Fax to [phone] when form completed.

## 2019-11-15 NOTE — Telephone Encounter (Signed)
Form received

## 2019-11-15 NOTE — Telephone Encounter (Signed)
Copied from [place] [id number]. Topic: General - Other >> Nov 15, 2019 10:18 AM Yvette Rack wrote: Reason for CRM: Pt spouse requested to speak with Arbie Cookey to provide fax# [phone] where paperwork should be faxed

## 2019-11-20 DIAGNOSIS — G4733 Obstructive sleep apnea (adult) (pediatric): Secondary | ICD-10-CM | POA: Diagnosis not present

## 2019-11-21 DIAGNOSIS — G4733 Obstructive sleep apnea (adult) (pediatric): Secondary | ICD-10-CM | POA: Diagnosis not present

## 2019-11-23 ENCOUNTER — Ambulatory Visit: Payer: Medicare Other | Admitting: Urology

## 2019-11-23 ENCOUNTER — Encounter: Payer: Self-pay | Admitting: Urology

## 2019-11-23 ENCOUNTER — Other Ambulatory Visit: Payer: Self-pay

## 2019-11-23 VITALS — BP 167/71 | HR 60 | Ht 70.0 in | Wt 253.0 lb

## 2019-11-23 DIAGNOSIS — L291 Pruritus scroti: Secondary | ICD-10-CM | POA: Diagnosis not present

## 2019-11-23 DIAGNOSIS — D294 Benign neoplasm of scrotum: Secondary | ICD-10-CM | POA: Diagnosis not present

## 2019-11-23 MED ORDER — NYSTATIN 100000 UNIT/GM EX POWD: 1.0000 "application " | Freq: Three times a day (TID) | CUTANEOUS | 0 refills | Status: DC

## 2019-11-23 NOTE — Progress Notes (Signed)
11/23/19 11:35 AM   [name] 27-Nov-1945 [id number]  Referring provider: [name]., MD [address] [place],  [place] [address] Chief Complaint  Patient presents with  . Follow-up    problems with scrotum    HPI: Ronald [name] is a 74 y.o. M w/ hx of [name] returns today c/o scrotum problems.   He reports of severe itching on scrotum onset 3 weeks w/ left testicle worse than right.   He has occasionally used antifungal cream previously prescribed.    He also notes of black dots on his scrotum.   PMH: Past Medical History:  Diagnosis Date  . AAA (abdominal aortic aneurysm) ([place])    a. 3cm by [place] 2015.  [name] Arthritis    "hips; back" (12/13/2014)  . CAD (coronary artery disease) 2007   a. s/p CABG- IMA-LAD, VG-Cx, VG-RCA, VG-diag in 1999. B. sp redo CABG- VG-OM, VG-RCA in 2007 due to VG disease. c. NSTEMI 11/2014 s/p DES to SVG-OM from the Y graft.d. PTCA/DES x 1 distal body of SVG to Diagonal.09/2015  . Chronic combined systolic and diastolic CHF (congestive heart failure) ([place])    a. remote EF 40-45% in 2006. b. Normal EF 2014. b. Echo 07/2016 EF 45-50%, grade 1 DD.  [name] Chronic lower back pain   . CKD (chronic kidney disease), stage IV ([place])   . COPD (chronic obstructive pulmonary disease) ([place])   . Deafness in left ear   . Degenerative disc disease, lumbar   . Dilated cardiomyopathy ([place]) 10/07/2015  . Emphysema   . Esophageal stricture 07/02/1998   EGD  . GERD (gastroesophageal reflux disease)   . History of gout    "last flareup was in 2007" (12/13/2014)  . History of hiatal hernia   . Hyperlipidemia   . Hypertension   . Ischemic cardiomyopathy 2006   EF 40% to 50% by 2D echo in 2006;  Echo 12/31/12: Mild LVH, EF 50-55%, normal wall motion.   [name] PVC's (premature ventricular contractions)   . Renal artery stenosis ([place])    a. noted on CT 2008.  . Type II diabetes mellitus ([place])    Diet control   . Walking pneumonia 1990's    Surgical  History: Past Surgical History:  Procedure Laterality Date  . CARDIAC CATHETERIZATION  "several"  . CARDIAC CATHETERIZATION N/A 12/13/2014   Procedure: Left Heart Cath and Coronary Angiography;  Surgeon: Jettie Booze, MD;  Location: [place] [facility];  Service: Cardiovascular;  Laterality: N/A;  . CARDIAC CATHETERIZATION  12/13/2014   Procedure: Coronary Stent Intervention;  Surgeon: Jettie Booze, MD;  Location: [place] [facility];  Service: Cardiovascular;;  . CARDIAC CATHETERIZATION N/A 10/07/2015   Procedure: Left Heart Cath and Cors/Grafts Angiography;  Surgeon: Burnell Blanks, MD;  Location: [facility];  Service: Cardiovascular;  Laterality: N/A;  . CARDIAC CATHETERIZATION N/A 10/07/2015   Procedure: Coronary Stent Intervention;  Surgeon: Burnell Blanks, MD;  Location: [facility] [facility];  Service: Cardiovascular;  Laterality: N/A;  . CORONARY ANGIOPLASTY  "several"  . CORONARY ANGIOPLASTY WITH STENT PLACEMENT  2005; 12/13/2014   "2; 1"  . CORONARY ARTERY BYPASS GRAFT  1996   CABG X5  . CORONARY ARTERY BYPASS GRAFT  March 2007   CABG X3  . ESOPHAGOGASTRODUODENOSCOPY (EGD) WITH ESOPHAGEAL DILATION  2000  . GREEN LIGHT LASER TURP (TRANSURETHRAL RESECTION OF PROSTATE  2000's   "not cancerous"  . HERNIA REPAIR    .  LAPAROSCOPIC CHOLECYSTECTOMY    . LEFT HEART CATH AND CORS/GRAFTS ANGIOGRAPHY N/A 07/28/2017   Procedure: LEFT HEART CATH AND CORS/GRAFTS ANGIOGRAPHY;  Surgeon: Troy Sine, MD;  Location: [facility];  Service: Cardiovascular;  Laterality: N/A;  . LUNG SURGERY  1996   "S/P CABG, had to put staple in lung after it had collapsed"  . UMBILICAL HERNIA REPAIR     w/chole    Home Medications:  Allergies as of 11/23/2019      Reactions   Predicort [prednisolone] Other (See Comments)   Stomach pain   Ciprofloxacin    GI upset   Hydrochlorothiazide Other (See Comments)   Dehydration   Hydrocodone    Stomach upset    Hydrocodone-acetaminophen    Stomach upset   Hydrocodone-acetaminophen Nausea Only   Loratadine Other (See Comments)   Other reaction(s): Unknown   Sulfa Antibiotics Other (See Comments)   Cannot recall   Sulfacetamide Sodium Other (See Comments)   Cannot recall   Sulfasalazine    Other reaction(s): Other (See Comments) Cannot recall   Hydrocodone-acetaminophen Nausea Only   Penicillins Hives, Rash   Has patient had a PCN reaction causing immediate rash, facial/tongue/throat swelling, SOB or lightheadedness with hypotension: YES Has patient had a PCN reaction causing severe rash involving mucus membranes or skin necrosis: NO Has patient had a PCN reaction that required hospitalization NO Has patient had a PCN reaction occurring within the last 10 years:NO If all of the above answers are "NO", then may proceed with Cephalosporin use.      Medication List       Accurate as of November 23, 2019 11:59 PM. If you have any questions, ask your nurse or doctor.        allopurinol 100 MG tablet Commonly known as: ZYLOPRIM TAKE 1 TABLET BY MOUTH AT  BEDTIME   amiodarone 200 MG tablet Commonly known as: PACERONE TAKE 1 TABLET BY MOUTH  DAILY   amLODipine 10 MG tablet Commonly known as: NORVASC Take 1 tablet (10 mg total) by mouth daily.   aspirin EC 81 MG tablet Take 1 tablet (81 mg total) by mouth daily.   atorvastatin 40 MG tablet Commonly known as: LIPITOR TAKE 1 TABLET BY MOUTH  DAILY   furosemide 40 MG tablet Commonly known as: LASIX Take 40 mg by mouth daily as needed for fluid or edema.   isosorbide mononitrate 30 MG 24 hr tablet Commonly known as: IMDUR TAKE 1 TABLET BY MOUTH  DAILY   levothyroxine 75 MCG tablet Commonly known as: SYNTHROID Take 1 tablet (75 mcg total) by mouth daily before breakfast.   loratadine 10 MG tablet Commonly known as: CLARITIN Take 10 mg by mouth daily as needed for allergies.   magnesium oxide 400 MG tablet Commonly known as:  MAG-OX TAKE 1 TABLET BY MOUTH TWICE A DAY   melatonin 5 MG Tabs Take 1 tablet by mouth at bedtime.   metoprolol tartrate 100 MG tablet Commonly known as: LOPRESSOR TAKE 1 TABLET BY MOUTH  TWICE DAILY   MULTIVITAMIN PO Take 1 tablet by mouth daily.   Nitrostat 0.4 MG SL tablet Generic drug: nitroGLYCERIN Place 0.4 mg under the tongue every 5 (five) minutes as needed for chest pain (Up to 3 times).   nystatin powder Commonly known as: nystatin Apply 1 application topically 3 (three) times daily. Started by: Hollice Espy, MD   pantoprazole 40 MG tablet Commonly known as: PROTONIX Take 2 tablets (80 mg total) by mouth  daily.   sodium bicarbonate 650 MG tablet Take 650 mg by mouth 2 (two) times daily. Evening   Vitamin D-3 125 MCG (5000 UT) Tabs Take 1 tablet by mouth daily.   Xarelto 15 MG Tabs tablet Generic drug: Rivaroxaban TAKE 1 TABLET (15 MG TOTAL) BY MOUTH DAILY WITH SUPPER.       Allergies:  Allergies  Allergen Reactions  . Predicort [Prednisolone] Other (See Comments)    Stomach pain  . Ciprofloxacin     GI upset  . Hydrochlorothiazide Other (See Comments)    Dehydration  . Hydrocodone     Stomach upset  . Hydrocodone-Acetaminophen     Stomach upset  . Hydrocodone-Acetaminophen Nausea Only  . Loratadine Other (See Comments)    Other reaction(s): Unknown  . Sulfa Antibiotics Other (See Comments)    Cannot recall  . Sulfacetamide Sodium Other (See Comments)    Cannot recall  . Sulfasalazine     Other reaction(s): Other (See Comments) Cannot recall  . Hydrocodone-Acetaminophen Nausea Only  . Penicillins Hives and Rash    Has patient had a PCN reaction causing immediate rash, facial/tongue/throat swelling, SOB or lightheadedness with hypotension: YES Has patient had a PCN reaction causing severe rash involving mucus membranes or skin necrosis: NO Has patient had a PCN reaction that required hospitalization NO Has patient had a PCN reaction  occurring within the last 10 years:NO If all of the above answers are "NO", then may proceed with Cephalosporin use.    Family History: Family History  Problem Relation Age of Onset  . Heart attack Mother        MI  . Stroke Mother   . Heart disease Mother   . Hypertension Mother   . Hyperlipidemia Mother   . Heart disease Father   . Rheumatic fever Father   . Colon cancer Neg Hx     Social History:  reports that he quit smoking about 33 years ago. His smoking use included cigarettes. He has a 60.00 pack-year smoking history. He has never used smokeless tobacco. He reports that he does not drink alcohol and does not use drugs.   Physical Exam: BP (!) 167/71   Pulse 60   Ht 5\' 10"  (1.778 m)   Wt 253 lb (114.8 kg)   BMI 36.30 kg/m   Constitutional:  Alert and oriented, No acute distress. HEENT: [place] AT, moist mucus membranes.  Trachea midline, no masses. Cardiovascular: No clubbing, cyanosis, or edema. Respiratory: Normal respiratory effort, no increased work of breathing. GU: No CVA tenderness. Multiple bilateral angiokeratoma of scrotum. No edema. Bilaterally descended testicles benign.  Skin: No rashes, bruises or suspicious lesions. Neurologic: Grossly intact, no focal deficits, moving all 4 extremities. Psychiatric: Normal mood and affect.  Laboratory Data:  Lab Results  Component Value Date   CREATININE 2.67 (H) 10/10/2019   Lab Results  Component Value Date   HGBA1C 5.9 (H) 08/01/2019   Assessment & Plan:    1. Multiple bilateral angiokeratoma of scrotum  Benign finding, no pathology  No yeast infection or edema  2. Scrotal itching  No obvious finding on physical exam today  Rx of Nystatin sent to [facility] [address] [place], [place] [address] [phone]  I, Lucas Mallow, am acting as a scribe for Dr. Hollice Espy,  I have reviewed the above documentation for accuracy and  completeness, and I agree with the above.   Hollice Espy, MD

## 2019-12-07 ENCOUNTER — Ambulatory Visit: Payer: Medicare Other | Admitting: Pharmacist

## 2019-12-07 ENCOUNTER — Other Ambulatory Visit: Payer: Self-pay

## 2019-12-07 DIAGNOSIS — E7800 Pure hypercholesterolemia, unspecified: Secondary | ICD-10-CM

## 2019-12-07 DIAGNOSIS — I480 Paroxysmal atrial fibrillation: Secondary | ICD-10-CM

## 2019-12-07 NOTE — Chronic Care Management (AMB) (Signed)
Chronic Care Management Pharmacy  Name: [name]  MRN: [id number] DOB: 02/13/1946  Chief Complaint/ HPI  Ronald [name],  74 y.o. , male presents for their Initial CCM visit with the clinical pharmacist via telephone due to COVID-19 Pandemic.  PCP : [name]., MD  Their chronic conditions include: CKD 4  Office Visits: 5/12 hypothyroidism, [name], wears CPAP, inc Synthroid 29mg   Consult Visit: Angiokeratoma, [place], BP 167/71 P 60 Wt 253 BMI 36.3, NystatinBP 4/19 CKD4, BP 140/78 WT 255.5 BMI 36.7, CPAP compliant, GFR 22, take more Lasix 2/1 CAD, [place], BP 164/70 P 62 Wt 262 BMI 37.6, 4 patent grafts, EF 35%, multiple stents,   Medications: Outpatient Encounter Medications as of 12/07/2019  Medication Sig  . allopurinol (ZYLOPRIM) 100 MG tablet TAKE 1 TABLET BY MOUTH AT  BEDTIME  . amiodarone (PACERONE) 200 MG tablet TAKE 1 TABLET BY MOUTH  DAILY  . amLODipine (NORVASC) 10 MG tablet Take 1 tablet (10 mg total) by mouth daily.  .Marland Kitchenaspirin EC 81 MG tablet Take 1 tablet (81 mg total) by mouth daily.  .Marland Kitchenatorvastatin (LIPITOR) 40 MG tablet TAKE 1 TABLET BY MOUTH  DAILY  . Cholecalciferol (VITAMIN D-3) 125 MCG (5000 UT) TABS Take 2,000 Units by mouth daily.   . furosemide (LASIX) 40 MG tablet Take 40 mg by mouth daily as needed for fluid or edema.   . isosorbide mononitrate (IMDUR) 30 MG 24 hr tablet TAKE 1 TABLET BY MOUTH  DAILY  . levothyroxine (SYNTHROID) 75 MCG tablet Take 1 tablet (75 mcg total) by mouth daily before breakfast.  . loratadine (CLARITIN) 10 MG tablet Take 10 mg by mouth daily as needed for allergies.  . magnesium oxide (MAG-OX) 400 MG tablet TAKE 1 TABLET BY MOUTH TWICE A DAY  . Melatonin 5 MG TABS Take 1 tablet by mouth at bedtime.  . metoprolol tartrate (LOPRESSOR) 100 MG tablet TAKE 1 TABLET BY MOUTH  TWICE DAILY  . Multiple Vitamin (MULTIVITAMIN PO) Take 1 tablet by mouth daily.  .Marland Kitchennystatin (NYSTATIN) powder Apply 1 application  topically 3 (three) times daily.  . pantoprazole (PROTONIX) 40 MG tablet Take 2 tablets (80 mg total) by mouth daily. (Patient taking differently: Take 40 mg by mouth 2 (two) times daily. )  . sodium bicarbonate 650 MG tablet Take 650 mg by mouth 2 (two) times daily. Evening  . XARELTO 15 MG TABS tablet TAKE 1 TABLET (15 MG TOTAL) BY MOUTH DAILY WITH SUPPER.  . nitroGLYCERIN (NITROSTAT) 0.4 MG SL tablet Place 0.4 mg under the tongue every 5 (five) minutes as needed for chest pain (Up to 3 times). (Patient not taking: Reported on 12/07/2019)   No facility-administered encounter medications on file as of 12/07/2019.     Financial Resource Strain: High Risk  . Difficulty of Paying Living Expenses: Very hard   Current Diagnosis/Assessment:  Goals Addressed            This Visit's Progress   . Chronic Care Management       CARE PLAN ENTRY (see longitudinal plan of care for additional care plan information)  Current Barriers:  . Chronic Disease Management support, education, and care coordination needs related to Hypertension, Hyperlipidemia, and Chronic Kidney Disease   Hypertension BP Readings from Last 3 Encounters:  11/23/19 (!) 167/71  08/01/19 (!) 161/74  07/02/19 (!) 164/70   . Pharmacist Clinical Goal(s): o Over the next 90 days, patient will work with PharmD and providers to  achieve BP goal <140/90 . Current regimen:  o Amlodipine 36m daily . Interventions: o Consider clonidine . Patient self care activities - Over the next 90 days, patient will: o Check BP daily, document, and provide at future appointments o Ensure daily salt intake < 2300 mg/day  Hyperlipidemia Lab Results  Component Value Date/Time   LDLCALC 101 (H) 05/01/2019 08:59 AM   . Pharmacist Clinical Goal(s): o Over the next 90 days, patient will work with PharmD and providers to achieve LDL goal < 70 . Current regimen:  o Lipitor 466mdaily . Interventions: o Recommended increasing atorvastatin 8023mdaily . Patient self care activities - Over the next 90 days, patient will: o Work with provider to achieve LDL (bad cholesterol) goal  Heart Failure . Pharmacist Clinical Goal(s) o Over the next 90 days, patient will work with PharmD and providers to achieve accessible anticoagulant . Current regimen:  o Xarelto 26m81mily . Interventions: o Recommend change to Eliquis 5mg 30mce daily o Acquired samples from [place] . Patient self care activities - Over the next 90 days, patient will: o Ask cardiologist about change to Eliquis for patient assistance program  Medication management . Pharmacist Clinical Goal(s): o Over the next 90 days, patient will work with PharmD and providers to maintain optimal medication adherence . Current pharmacy: [facility] . Interventions o Comprehensive medication review performed. o Continue current medication management strategy . Patient self care activities - Over the next 90 days, patient will: o Focus on medication adherence by asking cardiology to switch to Eliquis o Take medications as prescribed o Report any questions or concerns to PharmD and/or provider(s)  Initial goal documentation        Hypertension   Office blood pressures are  BP Readings from Last 3 Encounters:  11/23/19 (!) 167/71  08/01/19 (!) 161/74  07/02/19 (!) 164/70   Kidney Function Lab Results  Component Value Date/Time   CREATININE 2.67 (H) 10/10/2019 03:57 PM   CREATININE 2.59 (H) 05/01/2019 08:59 AM   CREATININE 2.44 (H) 11/24/2015 03:46 PM   CREATININE 2.17 (H) 10/20/2015 02:18 PM   GFRNONAA 22 (L) 10/10/2019 03:57 PM   GFRAA 26 (L) 10/10/2019 03:57 PM   K 5.3 (H) 10/10/2019 03:57 PM   K 4.8 05/01/2019 08:59 AM   Patient has failed these meds in the past: NA  Patient checks BP at home infrequently  Patient home BP readings are ranging: NA  We discussed:  BP very elevated. Not near goal. Options limited due to CKD 4  Plan  Continue  current medications   Heart Failure   Type: Systolic  Last ejection fraction: 35%  Patient has failed these meds in past: NA Patient is currently uncontrolled on the following medications: amiodarone, furosemide, Xarelto  We discussed: Can't afford Xarelto Used samples for six months in 2020. Ordered by JulieRuben Reasonelto patient assistance nearly impossible to qualify. Consider change to Eliquis.  Plan  [place] Continue current medications  Hyperlipidemia   LDL goal < 70  Lipid Panel     Component Value Date/Time   CHOL 171 05/01/2019 0859   TRIG 96 05/01/2019 0859   HDL 52 05/01/2019 0859   LDLCALC 101 (H) 05/01/2019 0859    Hepatic Function Latest Ref Rng & Units 10/10/2019 05/01/2019 10/31/2018  Total Protein 6.0 - 8.5 g/dL - 6.1 6.4  Albumin 3.7 - 4.7 g/dL 4.2 4.3 4.4  AST 0 - 40 IU/L -  32 21  ALT 0 - 44 IU/L - 35 25  Alk Phosphatase 39 - 117 IU/L - 178(H) 164(H)  Total Bilirubin 0.0 - 1.2 mg/dL - 0.7 1.1  Bilirubin, Direct 0.0 - 0.3 mg/dL - - -     The ASCVD Risk score (Winsted., et al., 2013) failed to calculate for the following reasons:   The patient has a prior MI or stroke diagnosis   Patient has failed these meds in past: [place] Patient is currently uncontrolled on the following medications:  . Lipitor 14m daily  We discussed:   Increase Lipitor 838m  Plan  Increase atorvastatin 8014mne tab daily  Medication Management   Pt uses [facility] pharmacy for all medications Uses pill box? Yes Pt endorses 100% compliance Disadvantaged to [employer]  We discussed:  Spoke with wife PamKoleen Nimrod deductible, [place], etc? Don[employer] Energyll face CPAP mask leaks Maybe trazodone? Melatonin takes hours to work meds taken in morning with food Last gout 2007 Xarelto $47 then $90 Ask [place] to switch to Eliquis for PAP Ask about Xarelto samples  Plan  Recommend start trazodone 32m47mtab by  mouth at bedtime Continue current medication management strategy  Follow up: 3 month phone visit  Harald Quevedo Milus HeightarmD, BCGPStony Creek MillsTSBottineau-[phone]

## 2019-12-09 NOTE — Patient Instructions (Addendum)
Visit Information  Goals Addressed            This Visit's Progress   . Chronic Care Management       CARE PLAN ENTRY (see longitudinal plan of care for additional care plan information)  Current Barriers:  . Chronic Disease Management support, education, and care coordination needs related to Hypertension, Hyperlipidemia, and Chronic Kidney Disease   Hypertension BP Readings from Last 3 Encounters:  11/23/19 (!) 167/71  08/01/19 (!) 161/74  07/02/19 (!) 164/70   . Pharmacist Clinical Goal(s): o Over the next 90 days, patient will work with PharmD and providers to achieve BP goal <140/90 . Current regimen:  o Amlodipine 10mg  daily . Interventions: o Consider clonidine . Patient self care activities - Over the next 90 days, patient will: o Check BP daily, document, and provide at future appointments o Ensure daily salt intake < 2300 mg/day  Hyperlipidemia Lab Results  Component Value Date/Time   LDLCALC 101 (H) 05/01/2019 08:59 AM   . Pharmacist Clinical Goal(s): o Over the next 90 days, patient will work with PharmD and providers to achieve LDL goal < 70 . Current regimen:  o Lipitor 40mg  daily . Interventions: o Recommended increasing atorvastatin 80mg  daily . Patient self care activities - Over the next 90 days, patient will: o Work with provider to achieve LDL (bad cholesterol) goal  Heart Failure . Pharmacist Clinical Goal(s) o Over the next 90 days, patient will work with PharmD and providers to achieve accessible anticoagulant . Current regimen:  o Xarelto 15mg  daily . Interventions: o Recommend change to Eliquis 5mg  twice daily o Acquired samples from [place] on 12/10/19 . Patient self care activities - Over the next 90 days, patient will: o Ask cardiologist about change to Eliquis for patient assistance program  Medication management . Pharmacist Clinical Goal(s): o Over the next 90 days, patient will work with PharmD and providers to maintain optimal  medication adherence . Current pharmacy: [facility] . Interventions o Comprehensive medication review performed. o Continue current medication management strategy . Patient self care activities - Over the next 90 days, patient will: o Focus on medication adherence by asking cardiology to switch to Eliquis o Take medications as prescribed o Report any questions or concerns to PharmD and/or provider(s)  Initial goal documentation        Ronald [name] was given information about Chronic Care Management services today including:  1. CCM service includes personalized support from designated clinical staff supervised by his physician, including individualized plan of care and coordination with other care providers 2. 24/7 contact phone numbers for assistance for urgent and routine care needs. 3. Standard insurance, coinsurance, copays and deductibles apply for chronic care management only during months in which we provide at least 20 minutes of these services. Most insurances cover these services at 100%, however patients may be responsible for any copay, coinsurance and/or deductible if applicable. This service may help you avoid the need for more expensive face-to-face services. 4. Only one practitioner may furnish and bill the service in a calendar month. 5. The patient may stop CCM services at any time (effective at the end of the month) by phone call to the office staff.  Patient agreed to services and verbal consent obtained.   Print copy of patient instructions provided.  Telephone follow up appointment with pharmacy team member scheduled for: 3 months  Ronald [name], PharmD, [place], [place] [phone]   Hypertension, Adult Hypertension is another name  for high blood pressure. High blood pressure forces your heart to work harder to pump blood. This can cause problems over time. There are two numbers in a blood pressure reading. There is a top  number (systolic) over a bottom number (diastolic). It is best to have a blood pressure that is below 120/80. Healthy choices can help lower your blood pressure, or you may need medicine to help lower it. What are the causes? The cause of this condition is not known. Some conditions may be related to high blood pressure. What increases the risk?  Smoking.  Having type 2 diabetes mellitus, high cholesterol, or both.  Not getting enough exercise or physical activity.  Being overweight.  Having too much fat, sugar, calories, or salt (sodium) in your diet.  Drinking too much alcohol.  Having long-term (chronic) kidney disease.  Having a family history of high blood pressure.  Age. Risk increases with age.  Race. You may be at higher risk if you are African American.  Gender. Men are at higher risk than women before age 38. After age 46, women are at higher risk than men.  Having obstructive sleep apnea.  Stress. What are the signs or symptoms?  High blood pressure may not cause symptoms. Very high blood pressure (hypertensive crisis) may cause: ? Headache. ? Feelings of worry or nervousness (anxiety). ? Shortness of breath. ? Nosebleed. ? A feeling of being sick to your stomach (nausea). ? Throwing up (vomiting). ? Changes in how you see. ? Very bad chest pain. ? Seizures. How is this treated?  This condition is treated by making healthy lifestyle changes, such as: ? Eating healthy foods. ? Exercising more. ? Drinking less alcohol.  Your health care provider may prescribe medicine if lifestyle changes are not enough to get your blood pressure under control, and if: ? Your top number is above 130. ? Your bottom number is above 80.  Your personal target blood pressure may vary. Follow these instructions at home: Eating and drinking   If told, follow the DASH eating plan. To follow this plan: ? Fill one half of your plate at each meal with fruits and  vegetables. ? Fill one fourth of your plate at each meal with whole grains. Whole grains include whole-wheat pasta, brown rice, and whole-grain bread. ? Eat or drink low-fat dairy products, such as skim milk or low-fat yogurt. ? Fill one fourth of your plate at each meal with low-fat (lean) proteins. Low-fat proteins include fish, chicken without skin, eggs, beans, and tofu. ? Avoid fatty meat, cured and processed meat, or chicken with skin. ? Avoid pre-made or processed food.  Eat less than 1,500 mg of salt each day.  Do not drink alcohol if: ? Your doctor tells you not to drink. ? You are pregnant, may be pregnant, or are planning to become pregnant.  If you drink alcohol: ? Limit how much you use to:  0-1 drink a day for women.  0-2 drinks a day for men. ? Be aware of how much alcohol is in your drink. In the [place] one drink equals one 12 oz bottle of beer (355 mL), one 5 oz glass of wine (148 mL), or one 1 oz glass of hard liquor (44 mL). Lifestyle   Work with your doctor to stay at a healthy weight or to lose weight. Ask your doctor what the best weight is for you.  Get at least 30 minutes of exercise most days of the week. This  may include walking, swimming, or biking.  Get at least 30 minutes of exercise that strengthens your muscles (resistance exercise) at least 3 days a week. This may include lifting weights or doing Pilates.  Do not use any products that contain nicotine or tobacco, such as cigarettes, e-cigarettes, and chewing tobacco. If you need help quitting, ask your doctor.  Check your blood pressure at home as told by your doctor.  Keep all follow-up visits as told by your doctor. This is important. Medicines  Take over-the-counter and prescription medicines only as told by your doctor. Follow directions carefully.  Do not skip doses of blood pressure medicine. The medicine does not work as well if you skip doses. Skipping doses also puts you at risk for  problems.  Ask your doctor about side effects or reactions to medicines that you should watch for. Contact a doctor if you:  Think you are having a reaction to the medicine you are taking.  Have headaches that keep coming back (recurring).  Feel dizzy.  Have swelling in your ankles.  Have trouble with your vision. Get help right away if you:  Get a very bad headache.  Start to feel mixed up (confused).  Feel weak or numb.  Feel faint.  Have very bad pain in your: ? Chest. ? Belly (abdomen).  Throw up more than once.  Have trouble breathing. Summary  Hypertension is another name for high blood pressure.  High blood pressure forces your heart to work harder to pump blood.  For most people, a normal blood pressure is less than 120/80.  Making healthy choices can help lower blood pressure. If your blood pressure does not get lower with healthy choices, you may need to take medicine. This information is not intended to replace advice given to you by your health care provider. Make sure you discuss any questions you have with your health care provider. Document Revised: 01/25/2018 Document Reviewed: 01/25/2018 Elsevier Patient Education  2020 [employer].

## 2019-12-12 ENCOUNTER — Ambulatory Visit: Payer: Self-pay | Admitting: Family Medicine

## 2019-12-12 DIAGNOSIS — H353132 Nonexudative age-related macular degeneration, bilateral, intermediate dry stage: Secondary | ICD-10-CM | POA: Diagnosis not present

## 2019-12-14 NOTE — Progress Notes (Signed)
Trena Platt Cummings,acting as a scribe for [name], MD.,have documented all relevant documentation on the behalf of [name], MD,as directed by  [name], MD while in the presence of [name], MD.  Established patient visit   Patient: Ronald [name]   DOB: 1946-05-08   74 y.o. Male  MRN: [id number] Visit Date: 12/17/2019  Today's healthcare provider: [name], MD   Chief Complaint  Patient presents with  . Hypertension  . Hypothyroidism  . Hyperglycemia   Subjective    HPI  Patient is stable and overall feels fairly well.  No issues today. Hypertension, follow-up  BP Readings from Last 3 Encounters:  12/17/19 (!) 152/75  11/23/19 (!) 167/71  08/01/19 (!) 161/74   Wt Readings from Last 3 Encounters:  12/17/19 252 lb 12.8 oz (114.7 kg)  11/23/19 253 lb (114.8 kg)  08/01/19 260 lb (117.9 kg)     He was last seen for hypertension 4 months ago.  BP at that visit was 161/74. Management since that visit includes no changes.  He reports excellent compliance with treatment. He is not having side effects.  He is following a Regular diet. He is not exercising. He does not smoke.  Use of agents associated with hypertension: none.   Outside blood pressures are not being checked.   ---------------------------------------------------------------------------------------------------      Medications: Outpatient Medications Prior to Visit  Medication Sig  . allopurinol (ZYLOPRIM) 100 MG tablet TAKE 1 TABLET BY MOUTH AT  BEDTIME  . amiodarone (PACERONE) 200 MG tablet TAKE 1 TABLET BY MOUTH  DAILY  . amLODipine (NORVASC) 10 MG tablet Take 1 tablet (10 mg total) by mouth daily.  [name] aspirin EC 81 MG tablet Take 1 tablet (81 mg total) by mouth daily.  [name] atorvastatin (LIPITOR) 40 MG tablet TAKE 1 TABLET BY MOUTH  DAILY  . Cholecalciferol (VITAMIN D-3) 125 MCG (5000 UT) TABS Take 2,000 Units by mouth daily.   . furosemide (LASIX)  40 MG tablet Take 40 mg by mouth daily as needed for fluid or edema.   . isosorbide mononitrate (IMDUR) 30 MG 24 hr tablet TAKE 1 TABLET BY MOUTH  DAILY  . levothyroxine (SYNTHROID) 75 MCG tablet Take 1 tablet (75 mcg total) by mouth daily before breakfast.  . loratadine (CLARITIN) 10 MG tablet Take 10 mg by mouth daily as needed for allergies.  . magnesium oxide (MAG-OX) 400 MG tablet TAKE 1 TABLET BY MOUTH TWICE A DAY  . Melatonin 5 MG TABS Take 1 tablet by mouth at bedtime.  . metoprolol tartrate (LOPRESSOR) 100 MG tablet TAKE 1 TABLET BY MOUTH  TWICE DAILY  . Multiple Vitamin (MULTIVITAMIN PO) Take 1 tablet by mouth daily.  . nitroGLYCERIN (NITROSTAT) 0.4 MG SL tablet Place 0.4 mg under the tongue every 5 (five) minutes as needed for chest pain (Up to 3 times).   . nystatin (NYSTATIN) powder Apply 1 application topically 3 (three) times daily.  . pantoprazole (PROTONIX) 40 MG tablet Take 2 tablets (80 mg total) by mouth daily. (Patient taking differently: Take 40 mg by mouth 2 (two) times daily. )  . sodium bicarbonate 650 MG tablet Take 650 mg by mouth 2 (two) times daily. Evening  . XARELTO 15 MG TABS tablet TAKE 1 TABLET (15 MG TOTAL) BY MOUTH DAILY WITH SUPPER.   No facility-administered medications prior to visit.    Review of Systems  Last hemoglobin A1c Lab Results  Component Value Date  HGBA1C 6.0 (A) 12/17/2019      Objective    BP (!) 152/75 (BP Location: Right Arm, Patient Position: Sitting, Cuff Size: Large)   Pulse (!) 55   Temp (!) 97.5 F (36.4 C) (Temporal)   Ht 5\' 10"  (1.778 m)   Wt 252 lb 12.8 oz (114.7 kg)   BMI 36.27 kg/m  BP Readings from Last 3 Encounters:  12/17/19 (!) 152/75  11/23/19 (!) 167/71  08/01/19 (!) 161/74   Wt Readings from Last 3 Encounters:  12/17/19 252 lb 12.8 oz (114.7 kg)  11/23/19 253 lb (114.8 kg)  08/01/19 260 lb (117.9 kg)      Physical Exam Vitals reviewed.  Constitutional:      Appearance: Normal appearance.  HENT:      Head: Normocephalic and atraumatic.     Right Ear: External ear normal.     Left Ear: External ear normal.  Eyes:     General: No scleral icterus.    Conjunctiva/sclera: Conjunctivae normal.  Cardiovascular:     Rate and Rhythm: Normal rate and regular rhythm.     Pulses: Normal pulses.     Heart sounds: Normal heart sounds.  Pulmonary:     Effort: Pulmonary effort is normal.     Breath sounds: Normal breath sounds.  Abdominal:     Palpations: Abdomen is soft.  Musculoskeletal:     Right lower leg: No edema.     Left lower leg: No edema.  Skin:    General: Skin is warm and dry.  Neurological:     General: No focal deficit present.     Mental Status: He is alert and oriented to person, place, and time.  Psychiatric:        Mood and Affect: Mood normal.        Behavior: Behavior normal.        Thought Content: Thought content normal.        Judgment: Judgment normal.       Results for orders placed or performed in visit on 12/17/19  POCT HgB A1C  Result Value Ref Range   Hemoglobin A1C 6.0 (A) 4.0 - 5.6 %   Estimated Average Glucose 126     Assessment & Plan     1. Essential hypertension Good control on metoprolol and amlodipine  2. Hypothyroidism, unspecified type   3. Hyperglycemia A1c good today at 6.0.  Follow-up in 6 months. - POCT HgB A1C  4. Coronary artery disease involving native coronary artery of native heart with unstable angina pectoris ([place]) All risk factors treated.  5. Dilated cardiomyopathy ([place]) Followed by cardiology.  6. Paroxysmal atrial fibrillation ([facility]) On Xarelto.  Also on amiodarone.  7. CKD (chronic kidney disease), stage IV ([place]) Sees nephrology regularly  8. Mixed hyperlipidemia On atorvastatin.  Return in about 4 months (around 04/18/2020).         Ronald Kosier Cranford Mon, MD  [name] [phone] (phone) [phone] (fax)  [place]

## 2019-12-17 ENCOUNTER — Ambulatory Visit (INDEPENDENT_AMBULATORY_CARE_PROVIDER_SITE_OTHER): Payer: Medicare Other | Admitting: Family Medicine

## 2019-12-17 ENCOUNTER — Other Ambulatory Visit: Payer: Self-pay

## 2019-12-17 ENCOUNTER — Encounter: Payer: Self-pay | Admitting: Family Medicine

## 2019-12-17 VITALS — BP 152/75 | HR 55 | Temp 97.5°F | Ht 70.0 in | Wt 252.8 lb

## 2019-12-17 DIAGNOSIS — I480 Paroxysmal atrial fibrillation: Secondary | ICD-10-CM

## 2019-12-17 DIAGNOSIS — I420 Dilated cardiomyopathy: Secondary | ICD-10-CM

## 2019-12-17 DIAGNOSIS — E782 Mixed hyperlipidemia: Secondary | ICD-10-CM

## 2019-12-17 DIAGNOSIS — R739 Hyperglycemia, unspecified: Secondary | ICD-10-CM

## 2019-12-17 DIAGNOSIS — I25110 Atherosclerotic heart disease of native coronary artery with unstable angina pectoris: Secondary | ICD-10-CM

## 2019-12-17 DIAGNOSIS — E039 Hypothyroidism, unspecified: Secondary | ICD-10-CM | POA: Diagnosis not present

## 2019-12-17 DIAGNOSIS — I10 Essential (primary) hypertension: Secondary | ICD-10-CM

## 2019-12-17 DIAGNOSIS — N184 Chronic kidney disease, stage 4 (severe): Secondary | ICD-10-CM

## 2019-12-17 LAB — POCT GLYCOSYLATED HEMOGLOBIN (HGB A1C)
Estimated Average Glucose: 126
Hemoglobin A1C: 6 % — AB (ref 4.0–5.6)

## 2020-01-10 ENCOUNTER — Telehealth: Payer: Self-pay

## 2020-01-10 NOTE — Telephone Encounter (Signed)
Patient wife is requesting a call back when faxed over to [employer].  Patient is stating that [employer] has not received fax from office.  Call back [phone]

## 2020-01-10 NOTE — Telephone Encounter (Signed)
Copied from [place] [id number]. Topic: General - Other >> Jan 10, 2020 10:27 AM Sheran Luz wrote: Patient's wife calling to check status of paperwork she left at front desk that was supposed to sent to [facility] and Wynetta Emery regarding patients Jennye Moccasin.

## 2020-01-10 NOTE — Telephone Encounter (Signed)
Pam was advised. Forms faxed.

## 2020-01-10 NOTE — Telephone Encounter (Signed)
Form has been completed.

## 2020-01-12 IMAGING — CR DG CHEST 2V
2 series · 2 of 2 positions shown · non-contrast
Comparison: 07/26/2017

CLINICAL DATA: Cough for 3 months. Shortness of breath, chest pain,
and palpitations. Coronary artery disease.

EXAM:
CHEST - 2 VIEW

[chest pa]
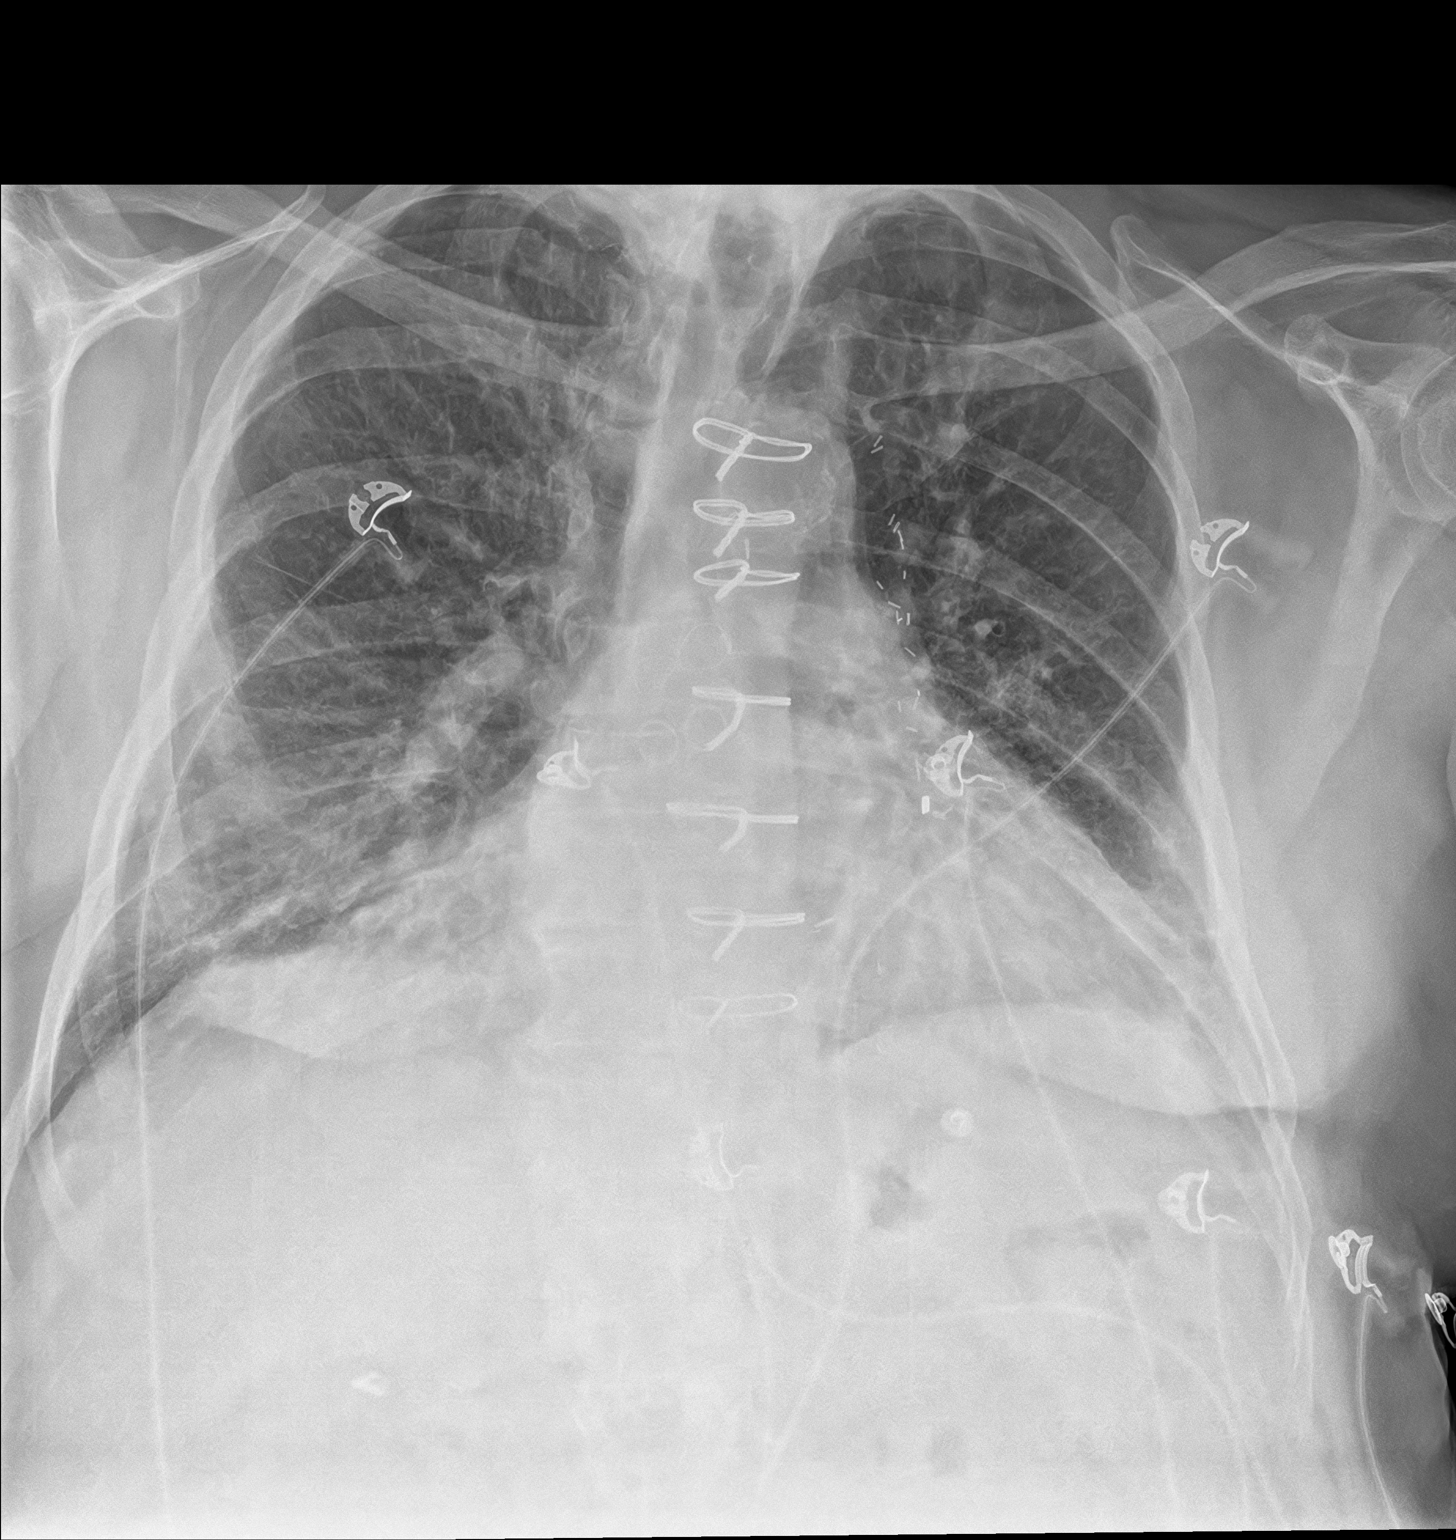

[chest lat]
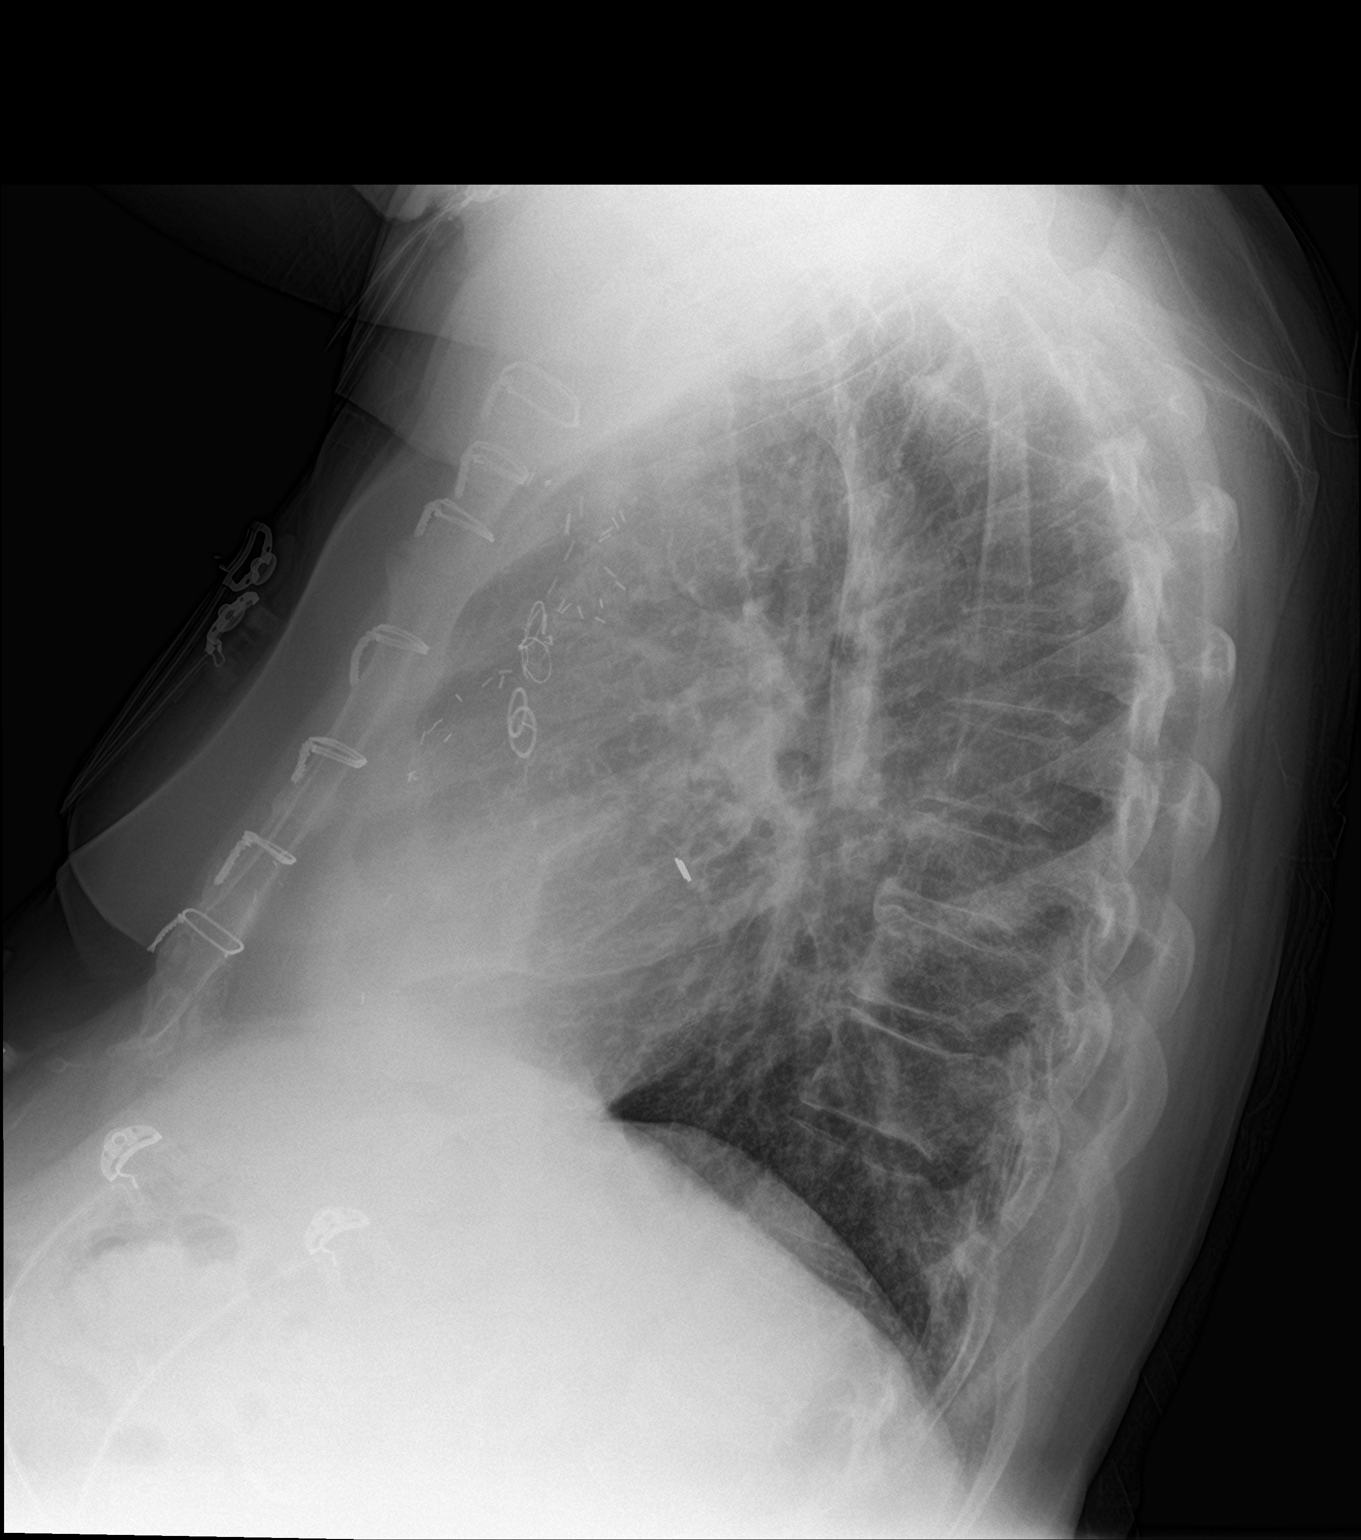

[2 of 2 positions shown; findings below may reference images not displayed]

FINDINGS: The heart size and mediastinal contours are within normal limits.
Aortic atherosclerosis. Prior CABG again noted.

Stable mild bibasilar scarring. No evidence of pulmonary infiltrate
or edema. No evidence of pleural effusion. The visualized skeletal
structures are unremarkable.
IMPRESSION: Stable bibasilar scarring.  No active cardiopulmonary disease.

## 2020-01-14 DIAGNOSIS — I10 Essential (primary) hypertension: Secondary | ICD-10-CM | POA: Diagnosis not present

## 2020-01-14 DIAGNOSIS — R600 Localized edema: Secondary | ICD-10-CM | POA: Diagnosis not present

## 2020-01-14 DIAGNOSIS — N2581 Secondary hyperparathyroidism of renal origin: Secondary | ICD-10-CM | POA: Diagnosis not present

## 2020-01-14 DIAGNOSIS — N184 Chronic kidney disease, stage 4 (severe): Secondary | ICD-10-CM | POA: Diagnosis not present

## 2020-01-14 DIAGNOSIS — R801 Persistent proteinuria, unspecified: Secondary | ICD-10-CM | POA: Diagnosis not present

## 2020-01-14 IMAGING — DX DG ABDOMEN 1V
2 series · 2 of 2 positions shown · non-contrast
Comparison: CT 07/08/2017.

CLINICAL DATA: Abdominal pain.

EXAM:
ABDOMEN - 1 VIEW

[abdomen supine (1 of 2)]
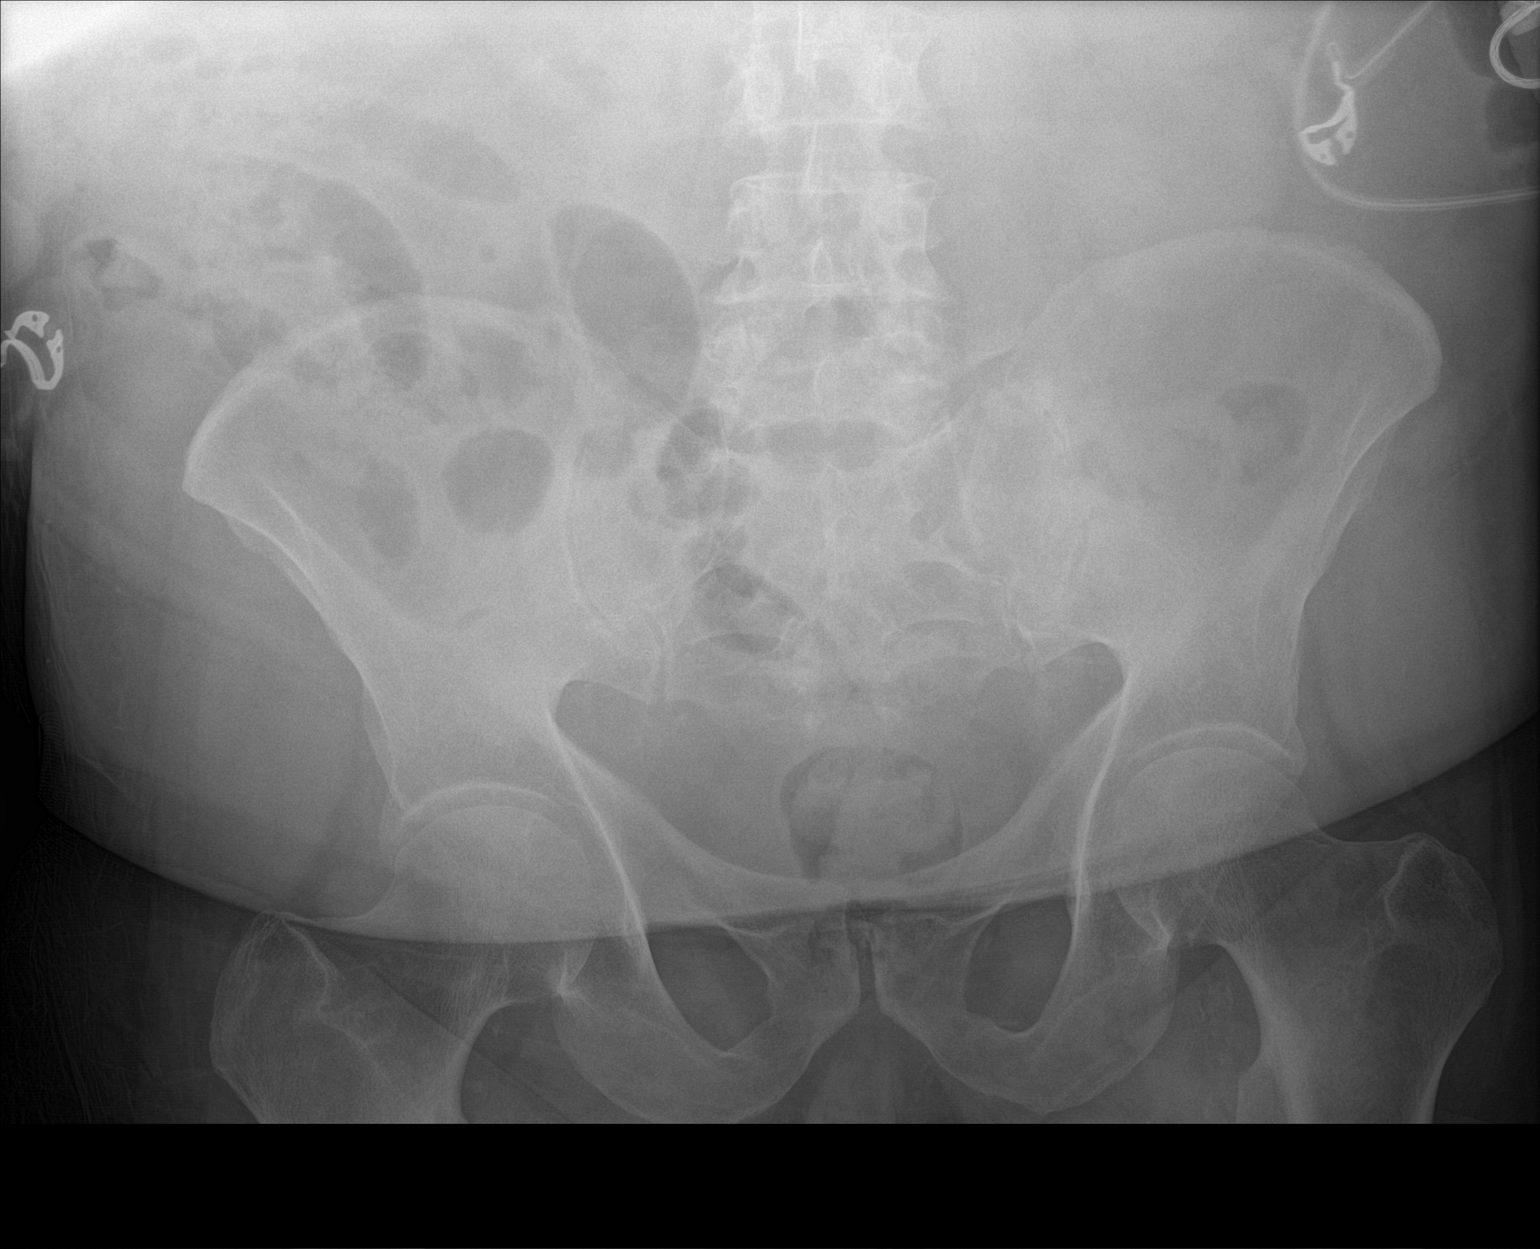

[abdomen supine (2 of 2)]
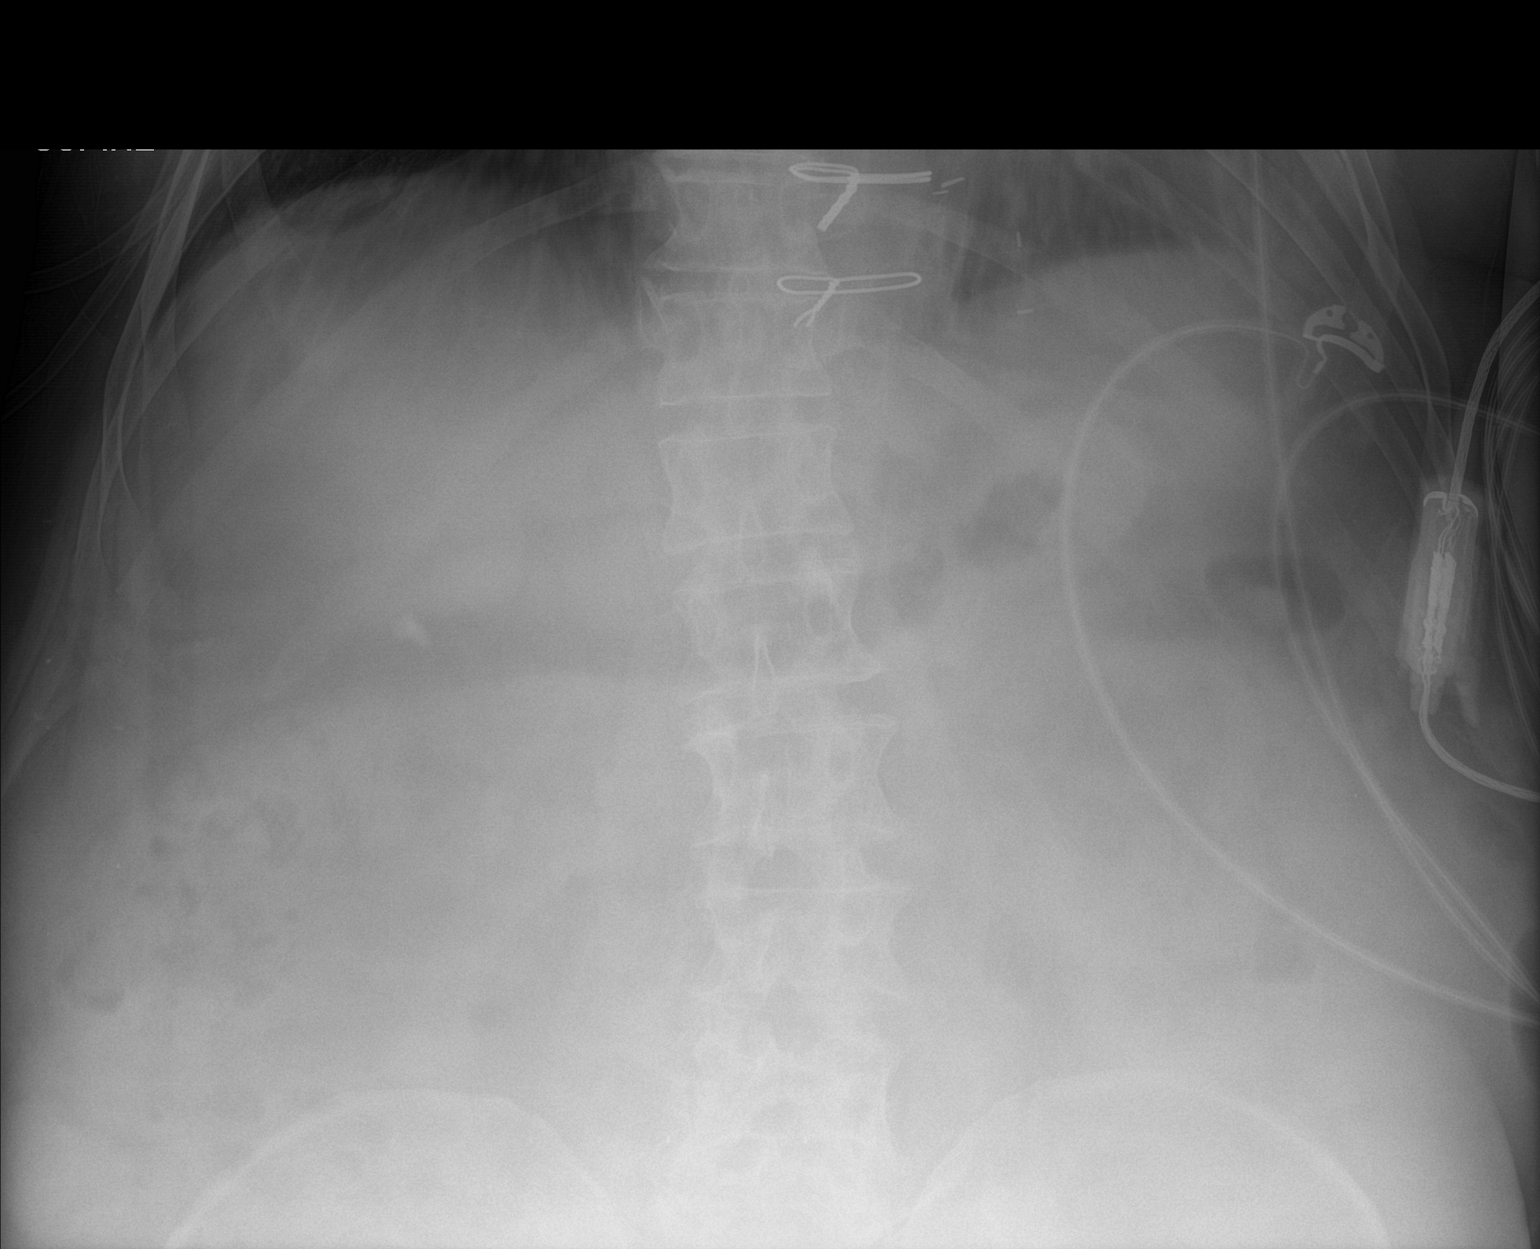

[2 of 2 positions shown; findings below may reference images not displayed]

FINDINGS: Prior median sternotomy. Surgical clips right upper quadrant. No
bowel distention or free air. Degenerative changes lumbar spine and
both hips. No acute bony abnormality identified. Degenerative
changes thoracolumbar spine. Aortoiliac atherosclerotic vascular
calcification. Left base atelectasis. Small left pleural effusion
can not be excluded.
IMPRESSION: 1.  No acute intra-abdominal abnormality.

2. Left base atelectasis. Small left pleural effusion can not be
excluded.

## 2020-01-16 ENCOUNTER — Other Ambulatory Visit: Payer: Self-pay | Admitting: Family Medicine

## 2020-01-16 DIAGNOSIS — R600 Localized edema: Secondary | ICD-10-CM | POA: Insufficient documentation

## 2020-01-16 DIAGNOSIS — E875 Hyperkalemia: Secondary | ICD-10-CM | POA: Insufficient documentation

## 2020-01-16 DIAGNOSIS — E872 Acidosis, unspecified: Secondary | ICD-10-CM | POA: Insufficient documentation

## 2020-01-16 NOTE — Telephone Encounter (Signed)
Requested Prescriptions  Pending Prescriptions Disp Refills  . amLODipine (NORVASC) 10 MG tablet [Pharmacy Med Name: AMLODIPINE BESYLATE 10 MG TAB] 90 tablet 3    Sig: TAKE 1 TABLET BY MOUTH EVERY DAY     Cardiovascular:  Calcium Channel Blockers Failed - 01/16/2020  1:20 AM      Failed - Last BP in normal range    BP Readings from Last 1 Encounters:  12/17/19 (!) 152/75         Passed - Valid encounter within last 6 months    Recent Outpatient Visits          1 month ago Essential hypertension   [facility] [name]., MD   3 months ago Hypothyroidism, unspecified type   [facility] [name]., MD   3 months ago CKD (chronic kidney disease), stage IV [facility])   [facility] [name]., MD   5 months ago Hypothyroidism, unspecified type   [facility] [name]., MD   8 months ago Annual physical exam   [facility] [name]., MD      Future Appointments            In 1 week Angelena Form, Annita Brod, MD [facility], [facility]   In 2 months [name]., MD [facility], [place]   In 3 months [name]., MD [facility], [place]

## 2020-01-18 ENCOUNTER — Encounter: Payer: Self-pay | Admitting: Gastroenterology

## 2020-01-18 ENCOUNTER — Ambulatory Visit: Payer: Medicare Other | Admitting: Gastroenterology

## 2020-01-18 VITALS — BP 158/82 | HR 54 | Ht 69.0 in | Wt 246.4 lb

## 2020-01-18 DIAGNOSIS — Z7901 Long term (current) use of anticoagulants: Secondary | ICD-10-CM | POA: Diagnosis not present

## 2020-01-18 DIAGNOSIS — K5900 Constipation, unspecified: Secondary | ICD-10-CM

## 2020-01-18 DIAGNOSIS — Z86010 Personal history of colonic polyps: Secondary | ICD-10-CM

## 2020-01-18 NOTE — Progress Notes (Signed)
HPI :  74 y/o male with history of CAD on plavix for multiple coronary stents s/p CABG (4 vessel 1996, 3 vessel redo 2007), ischemic cardiomyopathy - last EF around 35%, stage IV CKD, AF/ flutter on Xarelto, COPD, chronic dyspnea, here for reestablishment of care to discuss surveillance colonoscopy.  He had a colonoscopy with me in June 2018, he had 7 polyps removed, 5 them were adenomatous.  I had recommended a 3-year follow-up colonoscopy if he was agreeable and in stable health.  He states has been having problems with constipation at baseline.  He has a bowel movement once every 3 to 4 days, often strains with having a bowel movement and is uncomfortable.  He has used Metamucil in the past which has not really helped.  He is used low-dose MiraLAX in the past which has not really worked either.  He really does not take much for his bowels at this time.  No blood in his stools.  He states they are brown in color.  He has a hard time cleaning himself at times following a constipated stool.  He does have hemorrhoids but they are not bothering him right now.  He does not have any family history of colon cancer.  He does have exertional dyspnea at baseline, he states it is there all the time and stable.  He does not use any supplemental oxygen, he does wear CPAP at night.  He does have some chronic mild lower extremity edema.  He is seeing cardiology next week for routine follow-up.  His last ejection fraction was in 2020 was 35%.  He was present with his wife today to determine if he wishes to have any further colonoscopy exams.   Endoscopic history:  EGD 11/18/16 - - The exam of the esophagus was otherwise normal. No obvious stenosis / stricture noted. - A guidewire was placed and the scope was withdrawn. Empiric dilation was performed in the entire esophagus with a Savary dilator with no resistance at 17 mm and 18 mm. Relook endoscopy did not show any mucosal wrents. - The entire examined stomach  was normal. - The duodenal bulb and second portion of the duodenum were normal.  Colonoscopy 11/18/16 - The perianal and digital rectal examinations were normal. - A diminutive polyp was found in the cecum. The polyp was sessile. The polyp was removed with a cold biopsy forceps. Resection and retrieval were complete. - Two sessile polyps were found in the hepatic flexure. The polyps were 4 to 5 mm in size. These polyps were removed with a cold snare. Resection and retrieval were complete. - Four sessile polyps were found in the transverse colon. The polyps were 4 to 7 mm in size. These polyps were removed with a cold snare. Resection and retrieval were complete. - A 3 mm polyp was found in the sigmoid colon. The polyp was sessile. The polyp was removed with a cold snare. Resection and retrieval were complete. - A few small-mouthed diverticula were found in the sigmoid colon. - Internal hemorrhoids were found during retroflexion. - The exam was otherwise without abnormality.  Surgical [P], cecum, hepatic flexure, transverse, sigmoid, polyp (8) - TUBULAR ADENOMA (5). - HYPERPLASTIC POLYP (2). - BENIGN COLONIC MUCOSA (1). - NO HIGH GRADE DYSPLASIA OR MALIGNANCY.   Echo 06/15/18 - EF 30-35%, 35-40%     Past Medical History:  Diagnosis Date  . AAA (abdominal aortic aneurysm) ([place])    a. 3cm by [place] 2015.  [name] Arthritis    "  hips; back" (12/13/2014)  . CAD (coronary artery disease) 2007   a. s/p CABG- IMA-LAD, VG-Cx, VG-RCA, VG-diag in 1999. B. sp redo CABG- VG-OM, VG-RCA in 2007 due to VG disease. c. NSTEMI 11/2014 s/p DES to SVG-OM from the Y graft.d. PTCA/DES x 1 distal body of SVG to Diagonal.09/2015  . Chronic combined systolic and diastolic CHF (congestive heart failure) ([place])    a. remote EF 40-45% in 2006. b. Normal EF 2014. b. Echo 07/2016 EF 45-50%, grade 1 DD.  [name] Chronic lower back pain   . CKD (chronic kidney disease), stage IV ([place])   . COPD (chronic obstructive pulmonary disease)  ([place])   . Deafness in left ear   . Degenerative disc disease, lumbar   . Dilated cardiomyopathy ([place]) 10/07/2015  . Emphysema   . Esophageal stricture 07/02/1998   EGD  . GERD (gastroesophageal reflux disease)   . History of gout    "last flareup was in 2007" (12/13/2014)  . History of hiatal hernia   . Hyperlipidemia   . Hypertension   . Ischemic cardiomyopathy 2006   EF 40% to 50% by 2D echo in 2006;  Echo 12/31/12: Mild LVH, EF 50-55%, normal wall motion.   [name] PVC's (premature ventricular contractions)   . Renal artery stenosis ([place])    a. noted on CT 2008.  . Type II diabetes mellitus ([place])    Diet control   . Walking pneumonia 1990's     Past Surgical History:  Procedure Laterality Date  . CARDIAC CATHETERIZATION  "several"  . CARDIAC CATHETERIZATION N/A 12/13/2014   Procedure: Left Heart Cath and Coronary Angiography;  Surgeon: Jettie Booze, MD;  Location: [place] [facility];  Service: Cardiovascular;  Laterality: N/A;  . CARDIAC CATHETERIZATION  12/13/2014   Procedure: Coronary Stent Intervention;  Surgeon: Jettie Booze, MD;  Location: [place] [facility];  Service: Cardiovascular;;  . CARDIAC CATHETERIZATION N/A 10/07/2015   Procedure: Left Heart Cath and Cors/Grafts Angiography;  Surgeon: Burnell Blanks, MD;  Location: [place] [facility];  Service: Cardiovascular;  Laterality: N/A;  . CARDIAC CATHETERIZATION N/A 10/07/2015   Procedure: Coronary Stent Intervention;  Surgeon: Burnell Blanks, MD;  Location: [place] [facility];  Service: Cardiovascular;  Laterality: N/A;  . CORONARY ANGIOPLASTY  "several"  . CORONARY ANGIOPLASTY WITH STENT PLACEMENT  2005; 12/13/2014   "2; 1"  . CORONARY ARTERY BYPASS GRAFT  1996   CABG X5  . CORONARY ARTERY BYPASS GRAFT  March 2007   CABG X3  . ESOPHAGOGASTRODUODENOSCOPY (EGD) WITH ESOPHAGEAL DILATION  2000  . GREEN LIGHT LASER TURP (TRANSURETHRAL RESECTION OF PROSTATE  2000's   "not cancerous"  . HERNIA REPAIR    .  LAPAROSCOPIC CHOLECYSTECTOMY    . LEFT HEART CATH AND CORS/GRAFTS ANGIOGRAPHY N/A 07/28/2017   Procedure: LEFT HEART CATH AND CORS/GRAFTS ANGIOGRAPHY;  Surgeon: Troy Sine, MD;  Location: [place] [facility];  Service: Cardiovascular;  Laterality: N/A;  . LUNG SURGERY  1996   "S/P CABG, had to put staple in lung after it had collapsed"  . UMBILICAL HERNIA REPAIR     w/chole   Family History  Problem Relation Age of Onset  . Heart attack Mother        MI  . Stroke Mother   . Heart disease Mother   . Hypertension Mother   . Hyperlipidemia Mother   . Heart disease Father   . Rheumatic fever Father   . Colon cancer Neg Hx  Social History   Tobacco Use  . Smoking status: Former Smoker    Packs/day: 3.00    Years: 20.00    Pack years: 60.00    Types: Cigarettes    Quit date: 07/25/1986    Years since quitting: 33.5  . Smokeless tobacco: Never Used  Vaping Use  . Vaping Use: Never used  Substance Use Topics  . Alcohol use: No    Alcohol/week: 0.0 standard drinks  . Drug use: No   Current Outpatient Medications  Medication Sig Dispense Refill  . allopurinol (ZYLOPRIM) 100 MG tablet TAKE 1 TABLET BY MOUTH AT  BEDTIME 90 tablet 0  . amiodarone (PACERONE) 200 MG tablet TAKE 1 TABLET BY MOUTH  DAILY 90 tablet 2  . amLODipine (NORVASC) 10 MG tablet TAKE 1 TABLET BY MOUTH EVERY DAY 90 tablet 1  . aspirin EC 81 MG tablet Take 1 tablet (81 mg total) by mouth daily. 90 tablet 3  . atorvastatin (LIPITOR) 40 MG tablet TAKE 1 TABLET BY MOUTH  DAILY 90 tablet 2  . Cholecalciferol (VITAMIN D-3) 125 MCG (5000 UT) TABS Take 2,000 Units by mouth daily.     . furosemide (LASIX) 40 MG tablet Take 40 mg by mouth daily as needed for fluid or edema.   6  . isosorbide mononitrate (IMDUR) 30 MG 24 hr tablet TAKE 1 TABLET BY MOUTH  DAILY 90 tablet 2  . levothyroxine (SYNTHROID) 75 MCG tablet Take 1 tablet (75 mcg total) by mouth daily before breakfast. 90 tablet 3  . loratadine (CLARITIN) 10 MG  tablet Take 10 mg by mouth daily as needed for allergies.    . magnesium oxide (MAG-OX) 400 MG tablet TAKE 1 TABLET BY MOUTH TWICE A DAY 180 tablet 3  . Melatonin 5 MG TABS Take 1 tablet by mouth at bedtime.    . metoprolol tartrate (LOPRESSOR) 100 MG tablet TAKE 1 TABLET BY MOUTH  TWICE DAILY 180 tablet 2  . Multiple Vitamin (MULTIVITAMIN PO) Take 1 tablet by mouth daily.    . nitroGLYCERIN (NITROSTAT) 0.4 MG SL tablet Place 0.4 mg under the tongue every 5 (five) minutes as needed for chest pain (Up to 3 times).     . nystatin (NYSTATIN) powder Apply 1 application topically 3 (three) times daily. 15 g 0  . pantoprazole (PROTONIX) 40 MG tablet Take 2 tablets (80 mg total) by mouth daily. (Patient taking differently: Take 40 mg by mouth 2 (two) times daily. ) 180 tablet 3  . sodium bicarbonate 650 MG tablet Take 650 mg by mouth 2 (two) times daily. Evening    . XARELTO 15 MG TABS tablet TAKE 1 TABLET (15 MG TOTAL) BY MOUTH DAILY WITH SUPPER. 30 tablet 5   No current facility-administered medications for this visit.   Allergies  Allergen Reactions  . Predicort [Prednisolone] Other (See Comments)    Stomach pain  . Ciprofloxacin     GI upset  . Hydrochlorothiazide Other (See Comments)    Dehydration  . Hydrocodone     Stomach upset  . Hydrocodone-Acetaminophen     Stomach upset  . Hydrocodone-Acetaminophen Nausea Only  . Loratadine Other (See Comments)    Other reaction(s): Unknown  . Sulfa Antibiotics Other (See Comments)    Cannot recall  . Sulfacetamide Sodium Other (See Comments)    Cannot recall  . Sulfasalazine     Other reaction(s): Other (See Comments) Cannot recall  . Hydrocodone-Acetaminophen Nausea Only  . Penicillins Hives and Rash  Has patient had a PCN reaction causing immediate rash, facial/tongue/throat swelling, SOB or lightheadedness with hypotension: YES Has patient had a PCN reaction causing severe rash involving mucus membranes or skin necrosis: NO Has  patient had a PCN reaction that required hospitalization NO Has patient had a PCN reaction occurring within the last 10 years:NO If all of the above answers are "NO", then may proceed with Cephalosporin use.     Review of Systems: All systems reviewed and negative except where noted in HPI.   Lab Results  Component Value Date   WBC 6.5 05/01/2019   HGB 12.6 (L) 05/01/2019   HCT 38.8 05/01/2019   MCV 91 05/01/2019   PLT 147 (L) 05/01/2019    Lab Results  Component Value Date   CREATININE 2.67 (H) 10/10/2019   BUN 28 (H) 10/10/2019   NA 144 10/10/2019   K 5.3 (H) 10/10/2019   CL 109 (H) 10/10/2019   CO2 22 10/10/2019    No results found for: IRON, TIBC, FERRITIN   Physical Exam: BP (!) 158/82 (BP Location: Left Arm, Patient Position: Sitting, Cuff Size: Normal)   Pulse (!) 54   Ht 5\' 9"  (1.753 m)   Wt 246 lb 6 oz (111.8 kg)   BMI 36.38 kg/m  Constitutional: Pleasant,well-developed, male in no acute distress. HEENT: Normocephalic and atraumatic. Conjunctivae are normal. No scleral icterus. Neck supple.  Cardiovascular: Normal rate, regular rhythm.  Pulmonary/chest: Effort normal and breath sounds normal.  Abdominal: Soft, protuberant, nontender.  Extremities: (+) 1 LE edema Lymphadenopathy: No cervical adenopathy noted. Neurological: Alert and oriented to person place and time. Skin: Skin is warm and dry. No rashes noted. Psychiatric: Normal mood and affect. Behavior is normal.   ASSESSMENT AND PLAN: 74 year old male here for assessment of the following issues:  History of colon polyps / anticoagulated - history of 5 adenomas 3 years ago, normally would recommend a 3-year follow-up exam for this.  I discussed with him and his wife if he wanted to have any further colonoscopy surveillance exams given his age and comorbidities.  He is at higher than average risk for anesthesia given his cardiac history, his EF is 35% last year.  Further, he would need to hold his  Xarelto for a few days at least given his stage IV CKD in light of his bleeding risk with polypectomy, which could increase his risk short-term for stroke.  He has multiple medical problems, the risks of further surveillance may outweigh the benefits at this time.  He is going to see his cardiologist in the near future, will see if he has an echocardiogram performed which could help [place] determine if he was eligible for care at the [facility] or not.  That being said, in discussion with the patient and his wife, given his medical problems, it may be reasonable at this time to stop further surveillance and I think that is the way they are leaning however they will contact me after they see the cardiologist and think about this a bit further.  They understand risks benefits of pursuing colonoscopy versus risk benefits of holding off on further exams at this time.  All questions answered.  Constipation - discussed options with him, recommend higher dose MiraLAX, can take twice daily, or double dose twice daily and titrate up or down as needed.  If he fails this then can consider other stronger laxatives however he understands increased cost with these and potential risk for more urgent diarrhea or side effects, he  wants to try MiraLAX first.  Anton Chico Cellar, MD [facility]  CC: [name].,*

## 2020-01-18 NOTE — Patient Instructions (Signed)
If you are age 76 or older, your body mass index should be between 23-30. Your Body mass index is 36.38 kg/m. If this is out of the aforementioned range listed, please consider follow up with your Primary Care Provider.  If you are age 7 or younger, your body mass index should be between 19-25. Your Body mass index is 36.38 kg/m. If this is out of the aformentioned range listed, please consider follow up with your Primary Care Provider.   Please purchase the following medications over the counter and take as directed: Miralax: Take as directed, daily  Please follow up with [place] after your Cardiology appointment next week on 01-25-20.  Thank you for entrusting me with your care and for choosing [facility], Dr. Batavia Cellar

## 2020-01-21 DIAGNOSIS — N184 Chronic kidney disease, stage 4 (severe): Secondary | ICD-10-CM | POA: Diagnosis not present

## 2020-01-21 DIAGNOSIS — I10 Essential (primary) hypertension: Secondary | ICD-10-CM | POA: Diagnosis not present

## 2020-01-21 DIAGNOSIS — R600 Localized edema: Secondary | ICD-10-CM | POA: Diagnosis not present

## 2020-01-21 DIAGNOSIS — I701 Atherosclerosis of renal artery: Secondary | ICD-10-CM | POA: Diagnosis not present

## 2020-01-21 DIAGNOSIS — N2581 Secondary hyperparathyroidism of renal origin: Secondary | ICD-10-CM | POA: Diagnosis not present

## 2020-01-22 DIAGNOSIS — H353132 Nonexudative age-related macular degeneration, bilateral, intermediate dry stage: Secondary | ICD-10-CM | POA: Diagnosis not present

## 2020-01-23 ENCOUNTER — Other Ambulatory Visit: Payer: Self-pay | Admitting: Cardiovascular Disease

## 2020-01-25 ENCOUNTER — Other Ambulatory Visit: Payer: Self-pay

## 2020-01-25 ENCOUNTER — Encounter: Payer: Self-pay | Admitting: Cardiovascular Disease

## 2020-01-25 ENCOUNTER — Ambulatory Visit: Payer: Medicare Other | Admitting: Cardiovascular Disease

## 2020-01-25 VITALS — BP 144/60 | HR 54 | Ht 69.0 in | Wt 251.4 lb

## 2020-01-25 DIAGNOSIS — E7800 Pure hypercholesterolemia, unspecified: Secondary | ICD-10-CM

## 2020-01-25 DIAGNOSIS — I480 Paroxysmal atrial fibrillation: Secondary | ICD-10-CM

## 2020-01-25 DIAGNOSIS — I2510 Atherosclerotic heart disease of native coronary artery without angina pectoris: Secondary | ICD-10-CM | POA: Diagnosis not present

## 2020-01-25 DIAGNOSIS — I714 Abdominal aortic aneurysm, without rupture, unspecified: Secondary | ICD-10-CM

## 2020-01-25 DIAGNOSIS — I255 Ischemic cardiomyopathy: Secondary | ICD-10-CM | POA: Diagnosis not present

## 2020-01-25 DIAGNOSIS — I10 Essential (primary) hypertension: Secondary | ICD-10-CM

## 2020-01-25 DIAGNOSIS — I5042 Chronic combined systolic (congestive) and diastolic (congestive) heart failure: Secondary | ICD-10-CM

## 2020-01-25 NOTE — Patient Instructions (Signed)
Medication Instructions:  Your physician has recommended you make the following change in your medication:  1.) stop aspirin  *If you need a refill on your cardiac medications before your next appointment, please call your pharmacy*   Lab Work: none If you have labs (blood work) drawn today and your tests are completely normal, you will receive your results only by: [name] MyChart Message (if you have MyChart) OR . A paper copy in the mail If you have any lab test that is abnormal or we need to change your treatment, we will call you to review the results.   Testing/Procedures: none   Follow-Up: At [facility], you and your health needs are our priority.  As part of our continuing mission to provide you with exceptional heart care, we have created designated Provider Care Teams.  These Care Teams include your primary Cardiologist (physician) and Advanced Practice Providers (APPs -  Physician Assistants and Nurse Practitioners) who all work together to provide you with the care you need, when you need it.   Your next appointment:   6 month(s)  The format for your next appointment:   In Person  Provider:   You may see Lauree Chandler, MD or one of the following Advanced Practice Providers on your designated Care Team:    Melina Copa, PA-C  Ermalinda Barrios, PA-C    Other Instructions

## 2020-01-25 NOTE — Progress Notes (Signed)
Chief Complaint  Patient presents with  . Follow-up    CAD   History of Present Illness: 74 yo male with history of CAD s/p CABG in 1996 (LIMA-LAD, SVG-circumflex, SVG-RCA, SVG-diagonal) with redo bypass in 2007 (SVG-OM, SVG-RCA), ischemic cardiomyopathy, CKD, DM 2, HTN, HLD, renal artery stenosis, COPD, GERD, atrial fibrillation and PVCs here today for cardiac follow up. Echo in 2014 with normal LV systolic function. He was hospitalized at [facility] July 2016 with a NSTEMI. Cardiac cath with severe disease in the SVG to OM treated with a drug eluting stent. He was treated with Brilinta post PCI but developed dyspnea and was changed to Plavix. He was admitted to [facility] May 2017 with unstable angina and cardiac cath showed severe disease in the distal body/anastomosis of the SVG to Diagonal.  A drug eluting stent was placed in this vein graft. The LIMA to LAD was patent, SVG to OM was patent, SVG to PDA/PLA was patent. He was admitted to [facility] February 2018 with chest pain. Troponin was negative x 3. He did not have an ischemic evaluation. Echo February 2018 with  mild LV systolic dysfunction. LVEF=45-50%. I saw him in the office in January 2019 and he was volume overloaded. Lasix was started. He was admitted to [facility] February 2019 with unstable angina and cardiac cath showed patency of all of the bypass grafts. Echo February 2019 with normal LV systolic function, UTML=46-50%, grade 1 diastolic dysfunction. He was admitted to [facility] January 2020 with atrial fib/flutter with RVR. Started on Xarelto and initially rate controlled with IV Cardizem. He was loaded with IV amiodarone and discharged home on po amiodarone and Lopressor (Coreg stopped during admission). His Plavix was stopped and Norvasc were stopped at discharge. His Norvasc was restarted in primary care. Echo January 2020 with LVEF=30-35%. Mild MR.   He is here today for follow up. The patient denies any chest pain, palpitations, lower extremity edema,  orthopnea, PND, dizziness, near syncope or syncope. He has chronic dyspnea.    Primary Care Physician: [name]., MD  Past Medical History:  Diagnosis Date  . AAA (abdominal aortic aneurysm) ([place])    a. 3cm by [place] 2015.  [name] Arthritis    "hips; back" (12/13/2014)  . CAD (coronary artery disease) 2007   a. s/p CABG- IMA-LAD, VG-Cx, VG-RCA, VG-diag in 1999. B. sp redo CABG- VG-OM, VG-RCA in 2007 due to VG disease. c. NSTEMI 11/2014 s/p DES to SVG-OM from the Y graft.d. PTCA/DES x 1 distal body of SVG to Diagonal.09/2015  . Chronic combined systolic and diastolic CHF (congestive heart failure) ([place])    a. remote EF 40-45% in 2006. b. Normal EF 2014. b. Echo 07/2016 EF 45-50%, grade 1 DD.  [name] Chronic lower back pain   . CKD (chronic kidney disease), stage IV ([place])   . COPD (chronic obstructive pulmonary disease) ([place])   . Deafness in left ear   . Degenerative disc disease, lumbar   . Dilated cardiomyopathy ([place]) 10/07/2015  . Emphysema   . Esophageal stricture 07/02/1998   EGD  . GERD (gastroesophageal reflux disease)   . History of gout    "last flareup was in 2007" (12/13/2014)  . History of hiatal hernia   . Hyperlipidemia   . Hypertension   . Ischemic cardiomyopathy 2006   EF 40% to 50% by 2D echo in 2006;  Echo 12/31/12: Mild LVH, EF 50-55%, normal wall motion.   [name] PVC's (premature ventricular contractions)   . Renal artery  stenosis ([place])    a. noted on CT 2008.  . Type II diabetes mellitus ([place])    Diet control   . Walking pneumonia 1990's    Past Surgical History:  Procedure Laterality Date  . CARDIAC CATHETERIZATION  "several"  . CARDIAC CATHETERIZATION N/A 12/13/2014   Procedure: Left Heart Cath and Coronary Angiography;  Surgeon: Jettie Booze, MD;  Location: [place] [facility];  Service: Cardiovascular;  Laterality: N/A;  . CARDIAC CATHETERIZATION  12/13/2014   Procedure: Coronary Stent Intervention;  Surgeon: Jettie Booze, MD;  Location: [place] [facility]  [facility];  Service: Cardiovascular;;  . CARDIAC CATHETERIZATION N/A 10/07/2015   Procedure: Left Heart Cath and Cors/Grafts Angiography;  Surgeon: Burnell Blanks, MD;  Location: [place] [facility];  Service: Cardiovascular;  Laterality: N/A;  . CARDIAC CATHETERIZATION N/A 10/07/2015   Procedure: Coronary Stent Intervention;  Surgeon: Burnell Blanks, MD;  Location: [place] [facility];  Service: Cardiovascular;  Laterality: N/A;  . CORONARY ANGIOPLASTY  "several"  . CORONARY ANGIOPLASTY WITH STENT PLACEMENT  2005; 12/13/2014   "2; 1"  . CORONARY ARTERY BYPASS GRAFT  1996   CABG X5  . CORONARY ARTERY BYPASS GRAFT  March 2007   CABG X3  . ESOPHAGOGASTRODUODENOSCOPY (EGD) WITH ESOPHAGEAL DILATION  2000  . GREEN LIGHT LASER TURP (TRANSURETHRAL RESECTION OF PROSTATE  2000's   "not cancerous"  . HERNIA REPAIR    . LAPAROSCOPIC CHOLECYSTECTOMY    . LEFT HEART CATH AND CORS/GRAFTS ANGIOGRAPHY N/A 07/28/2017   Procedure: LEFT HEART CATH AND CORS/GRAFTS ANGIOGRAPHY;  Surgeon: [place] Sine, MD;  Location: [place] [facility];  Service: Cardiovascular;  Laterality: N/A;  . LUNG SURGERY  1996   "S/P CABG, had to put staple in lung after it had collapsed"  . UMBILICAL HERNIA REPAIR     w/chole    Current Outpatient Medications  Medication Sig Dispense Refill  . allopurinol (ZYLOPRIM) 100 MG tablet TAKE 1 TABLET BY MOUTH AT  BEDTIME 90 tablet 0  . amiodarone (PACERONE) 200 MG tablet TAKE 1 TABLET BY MOUTH  DAILY 90 tablet 2  . amLODipine (NORVASC) 10 MG tablet TAKE 1 TABLET BY MOUTH EVERY DAY 90 tablet 1  . aspirin EC 81 MG tablet Take 1 tablet (81 mg total) by mouth daily. 90 tablet 3  . atorvastatin (LIPITOR) 40 MG tablet TAKE 1 TABLET BY MOUTH  DAILY 90 tablet 2  . Cholecalciferol (VITAMIN D-3) 125 MCG (5000 UT) TABS Take 2,000 Units by mouth daily.     . furosemide (LASIX) 40 MG tablet Take 40 mg by mouth daily as needed for fluid or edema.   6  . isosorbide mononitrate (IMDUR) 30 MG 24  hr tablet TAKE 1 TABLET BY MOUTH  DAILY 90 tablet 3  . levothyroxine (SYNTHROID) 75 MCG tablet Take 1 tablet (75 mcg total) by mouth daily before breakfast. 90 tablet 3  . loratadine (CLARITIN) 10 MG tablet Take 10 mg by mouth daily as needed for allergies.    . magnesium oxide (MAG-OX) 400 MG tablet TAKE 1 TABLET BY MOUTH TWICE A DAY 180 tablet 3  . Melatonin 5 MG TABS Take 1 tablet by mouth at bedtime.    . metoprolol tartrate (LOPRESSOR) 100 MG tablet TAKE 1 TABLET BY MOUTH  TWICE DAILY 180 tablet 2  . Multiple Vitamin (MULTIVITAMIN PO) Take 1 tablet by mouth daily.    . nitroGLYCERIN (NITROSTAT) 0.4 MG SL tablet Place 0.4 mg under the tongue every  5 (five) minutes as needed for chest pain (Up to 3 times).     . nystatin (NYSTATIN) powder Apply 1 application topically 3 (three) times daily. 15 g 0  . pantoprazole (PROTONIX) 40 MG tablet Take 2 tablets (80 mg total) by mouth daily. (Patient taking differently: Take 40 mg by mouth 2 (two) times daily. ) 180 tablet 3  . sodium bicarbonate 650 MG tablet Take 650 mg by mouth 2 (two) times daily. Evening    . XARELTO 15 MG TABS tablet TAKE 1 TABLET (15 MG TOTAL) BY MOUTH DAILY WITH SUPPER. 30 tablet 5   No current facility-administered medications for this visit.    Allergies  Allergen Reactions  . Predicort [Prednisolone] Other (See Comments)    Stomach pain  . Ciprofloxacin     GI upset  . Hydrochlorothiazide Other (See Comments)    Dehydration  . Hydrocodone     Stomach upset  . Hydrocodone-Acetaminophen     Stomach upset  . Hydrocodone-Acetaminophen Nausea Only  . Loratadine Other (See Comments)    Other reaction(s): Unknown  . Sulfa Antibiotics Other (See Comments)    Cannot recall  . Sulfacetamide Sodium Other (See Comments)    Cannot recall  . Sulfasalazine     Other reaction(s): Other (See Comments) Cannot recall  . Hydrocodone-Acetaminophen Nausea Only  . Penicillins Hives and Rash    Has patient had a PCN reaction  causing immediate rash, facial/tongue/throat swelling, SOB or lightheadedness with hypotension: YES Has patient had a PCN reaction causing severe rash involving mucus membranes or skin necrosis: NO Has patient had a PCN reaction that required hospitalization NO Has patient had a PCN reaction occurring within the last 10 years:NO If all of the above answers are "NO", then may proceed with Cephalosporin use.    Social History   Socioeconomic History  . Marital status: Married    Spouse name: Not on file  . Number of children: 2  . Years of education: Not on file  . Highest education level: 8th grade  Occupational History  . Occupation: Retired  Tobacco Use  . Smoking status: Former Smoker    Packs/day: 3.00    Years: 20.00    Pack years: 60.00    Types: Cigarettes    Quit date: 07/25/1986    Years since quitting: 33.5  . Smokeless tobacco: Never Used  Vaping Use  . Vaping Use: Never used  Substance and Sexual Activity  . Alcohol use: No    Alcohol/week: 0.0 standard drinks  . Drug use: No  . Sexual activity: Not Currently  Other Topics Concern  . Not on file  Social History Narrative   Did auto salvage work.   Lives at home with his wife.  Independent at baseline.   Social Determinants of Health   Financial Resource Strain: High Risk  . Difficulty of Paying Living Expenses: Very hard  Food Insecurity:   . Worried About Charity fundraiser in the Last Year: Not on file  . Ran Out of Food in the Last Year: Not on file  Transportation Needs:   . Lack of Transportation (Medical): Not on file  . Lack of Transportation (Non-Medical): Not on file  Physical Activity:   . Days of Exercise per Week: Not on file  . Minutes of Exercise per Session: Not on file  Stress:   . Feeling of Stress : Not on file  Social Connections:   . Frequency of Communication with Friends and Family: Not  on file  . Frequency of Social Gatherings with Friends and Family: Not on file  . Attends  Religious Services: Not on file  . Active Member of Clubs or Organizations: Not on file  . Attends Archivist Meetings: Not on file  . Marital Status: Not on file  Intimate Partner Violence:   . Fear of Current or Ex-Partner: Not on file  . Emotionally Abused: Not on file  . Physically Abused: Not on file  . Sexually Abused: Not on file    Family History  Problem Relation Age of Onset  . Heart attack Mother        MI  . Stroke Mother   . Heart disease Mother   . Hypertension Mother   . Hyperlipidemia Mother   . Heart disease Father   . Rheumatic fever Father   . Colon cancer Neg Hx     Review of Systems:  As stated in the HPI and otherwise negative.   BP (!) 144/60   Pulse (!) 54   Ht 5\' 9"  (1.753 m)   Wt 251 lb 6.4 oz (114 kg)   SpO2 96%   BMI 37.13 kg/m   Physical Examination:  General: Well developed, well nourished, NAD  HEENT: OP clear, mucus membranes moist  SKIN: warm, dry. No rashes. Neuro: No focal deficits  Musculoskeletal: Muscle strength 5/5 all ext  Psychiatric: Mood and affect normal  Neck: No JVD, no carotid bruits, no thyromegaly, no lymphadenopathy.  Lungs:Clear bilaterally, no wheezes, rhonci, crackles Cardiovascular: Regular rate and rhythm. No murmurs, gallops or rubs. Abdomen:Soft. Bowel sounds present. Non-tender.  Extremities: No lower extremity edema. Pulses are 2 + in the bilateral DP/PT.  Echo 07/14/17: - Left ventricle: The cavity size was mildly dilated. Systolic   function was normal. The estimated ejection fraction was in the   range of 55% to 60%. Doppler parameters are consistent with   abnormal left ventricular relaxation (grade 1 diastolic   dysfunction).  Echo January 2020: Left ventricle: The cavity size was moderately dilated. Systolic  function was moderately to severely reduced. The estimated  ejection fraction was in the range of 30% to 35%. Diffuse  hypokinesis. The study is not technically sufficient  to allow  evaluation of LV diastolic function.  - Mitral valve: There was mild regurgitation.  - Left atrium: The atrium was mildly dilated.  - Right ventricle: The cavity size was mildly dilated. Wall  thickness was normal. Systolic function was mildly reduced  Cardiac cath February 2019: Significant native CAD with total occlusion of the LAD after the takeoff of the first diagonal vessel; total occlusion of the proximal left circumflex coronary artery; and total occlusion of the proximal RCA with antegrade bridging collaterals. Patent LIMA to LAD. Patent Y vein graft from the 1996 surgery which supplies the diagonal vessel and distal circumflex marginal vessel.  There is diffuse narrowing of 50% in the midportion of the Y graft supplying the diagonal vessel and a patent distal stent extending to the ostium of the diagonal.  The Y graft supplying the distal marginal is free of significant disease and has mild luminal irregularity. Patent SVG supplying the distal RCA from the 2007 surgery with a stent in the proximal portion of the graft with 30% narrowings in the proximal and mid segment. LVEDP 18 mm  Diagnostic Diagram        EKG:  EKG is not ordered today. The ekg ordered today demonstrates   Recent Labs: 05/01/2019: ALT 35;  Hemoglobin 12.6; Platelets 147 10/10/2019: BUN 28; Creatinine, Ser 2.67; NT-Pro BNP 1,020; Potassium 5.3; Sodium 144; TSH 8.940   Lipid Panel    Component Value Date/Time   CHOL 171 05/01/2019 0859   TRIG 96 05/01/2019 0859   HDL 52 05/01/2019 0859   CHOLHDL 3.3 05/01/2019 0859   CHOLHDL 4.5 12/13/2014 0423   VLDL 25 12/13/2014 0423   LDLCALC 101 (H) 05/01/2019 0859     Wt Readings from Last 3 Encounters:  01/25/20 251 lb 6.4 oz (114 kg)  01/18/20 246 lb 6 oz (111.8 kg)  12/17/19 252 lb 12.8 oz (114.7 kg)     Other studies Reviewed: Additional studies/ records that were reviewed today include: . Review of the above records demonstrates:     Assessment and Plan:   1. CAD s/p CABG without angina: Cardiac cath February 2019 with 4 patent vein grafts. NO chest pain. Continue statin, Imdur and beta blocker. Stop ASA due to easy bruising       2. Ischemic Cardiomyopathy: LVEF =35% by echo at [facility] during his admission with atrial fib. No Ace-inh or ARB due to CKD. Continue beta blocker.   3. Hypertension: BP is well controlled today. No changes in therapy  4. Hyperlipidemia: Lipids followed in primary care. Continue statin.    5. CKD: Followed by Nephrology. Ace-inh stopped due to renal insufficiency.   6. Chronic systolic CHF: Weight is stable. Mild LE edema. Continue to use Lasix as needed.    7. PVCs/Bigeminy: No palpitations. Continue beta blocker.    8. AAA: Slight increase from 2.8 cm to 3.1 cm by u/s march 2021. Repeat u/s march 2022.    9. Atrial fibrillation, paroxysmal: He is in sinus today. Will continue beta blocker, amiodarone and Xarelto. He wishes to change to Eliquis but has 45 days of Xarelto left. CHADS VASC score 5. Recent Chest x-ray, LFTs and TSH. They will call back in one month to make the change to Eliquis.   10. Sleep apnea: he is on CPAP.  Current medicines are reviewed at length with the patient today.  The patient does not have concerns regarding medicines.  The following changes have been made:  no change  Labs/ tests ordered today include:   No orders of the defined types were placed in this encounter.   Disposition:   FU with me in 6 months.    Signed, Lauree Chandler, MD 01/25/2020 4:41 PM    [facility] [address], [place], [place]  [address] Phone: [phone]; Fax: [phone]

## 2020-02-03 ENCOUNTER — Other Ambulatory Visit: Payer: Self-pay | Admitting: Cardiovascular Disease

## 2020-02-05 ENCOUNTER — Other Ambulatory Visit: Payer: Self-pay | Admitting: Cardiovascular Disease

## 2020-02-05 NOTE — Telephone Encounter (Signed)
Prescription refill request for Xarelto received.   Last office visit: [place], 01/25/2020 Weight: 114 kg Age: 74 y.o. Scr: 2.59, 05/01/2019 CrCl: 39 ml/min   Prescription refill sent.

## 2020-02-25 ENCOUNTER — Telehealth: Payer: Self-pay

## 2020-02-25 NOTE — Telephone Encounter (Signed)
Copied from [place] [id number]. Topic: Appointment Scheduling - Scheduling Inquiry for Clinic >> Feb 25, 2020 12:43 PM Rainey Pines A wrote: Patients wife would like a callback in regards to scheduling covid test

## 2020-02-26 ENCOUNTER — Telehealth: Payer: Self-pay

## 2020-02-26 DIAGNOSIS — R432 Parageusia: Secondary | ICD-10-CM | POA: Diagnosis not present

## 2020-02-26 NOTE — Telephone Encounter (Signed)
Called to ask patient about being covid tested, LVMTCB.

## 2020-02-26 NOTE — Telephone Encounter (Signed)
Copied from [place] [id number] [phone]. Topic: General - Inquiry >> Feb 26, 2020  1:38 PM Gillis Ends D wrote: Reason for CRM: Patient has to take his wife to an appointment but he would like for you to leave an appointment date and time and he will be there. He can be reached at [phone]. Please advise

## 2020-02-26 NOTE — Telephone Encounter (Signed)
Patient advised that covid test is today at 4:45 PM.

## 2020-02-26 NOTE — Telephone Encounter (Signed)
Pt's wife called back asking if she can schedule a covid test at the office.  Pt has loss of taste.  No fever, no cough.  CB#  [phone]

## 2020-02-28 ENCOUNTER — Telehealth: Payer: Self-pay

## 2020-02-28 LAB — NOVEL CORONAVIRUS, NAA: SARS-CoV-2, NAA: NOT DETECTED

## 2020-02-28 LAB — SARS-COV-2, NAA 2 DAY TAT

## 2020-02-28 NOTE — Telephone Encounter (Signed)
-----   Message from [name]., MD sent at 02/28/2020  8:04 AM EDT ----- No Covid.

## 2020-02-28 NOTE — Telephone Encounter (Signed)
Patient advised of covid test results via mychart.

## 2020-02-29 ENCOUNTER — Telehealth: Payer: Self-pay | Admitting: Cardiovascular Disease

## 2020-02-29 NOTE — Telephone Encounter (Signed)
Pt's wife is calling because they said they were told to call in 2 weeks before they was to start eliquis. Please call

## 2020-03-03 ENCOUNTER — Other Ambulatory Visit: Payer: Self-pay | Admitting: Family Medicine

## 2020-03-04 MED ORDER — APIXABAN 5 MG PO TABS: 5.0000 mg | ORAL_TABLET | Freq: Two times a day (BID) | ORAL | 3 refills | Status: DC

## 2020-03-05 NOTE — Telephone Encounter (Signed)
Eliquis 5 mg twice daily was sent to pharmacy yesterday.

## 2020-03-10 ENCOUNTER — Ambulatory Visit: Payer: Self-pay | Admitting: Pharmacist

## 2020-03-10 ENCOUNTER — Other Ambulatory Visit: Payer: Self-pay

## 2020-03-10 DIAGNOSIS — N184 Chronic kidney disease, stage 4 (severe): Secondary | ICD-10-CM

## 2020-03-10 DIAGNOSIS — E7800 Pure hypercholesterolemia, unspecified: Secondary | ICD-10-CM

## 2020-03-10 NOTE — Chronic Care Management (AMB) (Signed)
Chronic Care Management Pharmacy  Name: Ronald [name] [facility]  MRN: [id number] DOB: 06-17-1945  Chief Complaint/ HPI  Ronald [name],  74 y.o. , male presents for their Follow-Up CCM visit with the clinical pharmacist via telephone due to COVID-19 Pandemic.  PCP : [name]., MD  Their chronic conditions include: CKD, HG, HLD  Office Visits:NA  Consult Visit:NA  Medications: Outpatient Encounter Medications as of 03/10/2020  Medication Sig  . allopurinol (ZYLOPRIM) 100 MG tablet TAKE 1 TABLET BY MOUTH AT  BEDTIME  . amiodarone (PACERONE) 200 MG tablet TAKE 1 TABLET BY MOUTH  DAILY  . amLODipine (NORVASC) 10 MG tablet TAKE 1 TABLET BY MOUTH EVERY DAY  . apixaban (ELIQUIS) 5 MG TABS tablet Take 1 tablet (5 mg total) by mouth 2 (two) times daily.  [name] aspirin EC 81 MG tablet Take 1 tablet (81 mg total) by mouth daily.  [name] atorvastatin (LIPITOR) 40 MG tablet TAKE 1 TABLET BY MOUTH  DAILY  . Cholecalciferol (VITAMIN D-3) 125 MCG (5000 UT) TABS Take 2,000 Units by mouth daily.   . furosemide (LASIX) 40 MG tablet Take 40 mg by mouth daily as needed for fluid or edema.   . isosorbide mononitrate (IMDUR) 30 MG 24 hr tablet TAKE 1 TABLET BY MOUTH  DAILY  . levothyroxine (SYNTHROID) 75 MCG tablet Take 1 tablet (75 mcg total) by mouth daily before breakfast.  . loratadine (CLARITIN) 10 MG tablet Take 10 mg by mouth daily as needed for allergies.  . magnesium oxide (MAG-OX) 400 MG tablet TAKE 1 TABLET BY MOUTH TWICE A DAY  . Melatonin 5 MG TABS Take 1 tablet by mouth at bedtime.  . metoprolol tartrate (LOPRESSOR) 100 MG tablet TAKE 1 TABLET BY MOUTH  TWICE DAILY  . Multiple Vitamin (MULTIVITAMIN PO) Take 1 tablet by mouth daily.  . nitroGLYCERIN (NITROSTAT) 0.4 MG SL tablet Place 0.4 mg under the tongue every 5 (five) minutes as needed for chest pain (Up to 3 times).   . nystatin (NYSTATIN) powder Apply 1 application topically 3 (three) times daily.  . pantoprazole (PROTONIX) 40 MG tablet  Take 2 tablets (80 mg total) by mouth daily. (Patient taking differently: Take 40 mg by mouth 2 (two) times daily. )  . sodium bicarbonate 650 MG tablet Take 650 mg by mouth 2 (two) times daily. Evening   No facility-administered encounter medications on file as of 03/10/2020.     Financial Resource Strain: High Risk  . Difficulty of Paying Living Expenses: Very hard   Current Diagnosis/Assessment:  Goals Addressed            This Visit's Progress   . Chronic Care Management       CARE PLAN ENTRY (see longitudinal plan of care for additional care plan information)  Current Barriers:  . Chronic Disease Management support, education, and care coordination needs related to Hypertension, Hyperlipidemia, and Chronic Kidney Disease   Hypertension BP Readings from Last 3 Encounters:  11/23/19 (!) 167/71  08/01/19 (!) 161/74  07/02/19 (!) 164/70   . Pharmacist Clinical Goal(s): o Over the next 90 days, patient will work with PharmD and providers to achieve BP goal <140/90 . Current regimen:  o Amlodipine 11m daily . Interventions: o Consider clonidine . Patient self care activities - Over the next 90 days, patient will: o Check BP daily, document, and provide at future appointments o Ensure daily salt intake < 2300 mg/day  Hyperlipidemia Lab Results  Component Value Date/Time  LDLCALC 101 (H) 05/01/2019 08:59 AM   . Pharmacist Clinical Goal(s): o Over the next 90 days, patient will work with PharmD and providers to achieve LDL goal < 70 . Current regimen:  o Lipitor 43m daily . Interventions: o Recommended increasing atorvastatin 831mdaily . Patient self care activities - Over the next 90 days, patient will: o Work with provider to achieve LDL (bad cholesterol) goal  Heart Failure . Pharmacist Clinical Goal(s) o Over the next 90 days, patient will work with PharmD and providers to achieve accessible anticoagulant . Current regimen:  o Eliquis 3m50mwice  daily . Interventions: o Recommended stopping Eliquis due to poor kidney function (GFR < 19 ml/min) o Recommended starting warfarin for anticoagulation. . Patient self care activities - Over the next 90 days, patient will: o Ask cardiologist about change to Eliquis for patient assistance program  Medication management . Pharmacist Clinical Goal(s): o Over the next 90 days, patient will work with PharmD and providers to maintain optimal medication adherence . Current pharmacy: [facility] . Interventions o Comprehensive medication review performed. o Continue current medication management strategy . Patient self care activities - Over the next 90 days, patient will: o Focus on medication adherence by asking cardiology to switch to Eliquis o Take medications as prescribed o Report any questions or concerns to PharmD and/or provider(s)  Initial goal documentation       CKD   Patient has failed these meds in past: NA Patient is currently uncontrolled on the following medications: NA  We discussed:  Per patient, renal function 19 ml/min at last check Eliquis may not be appropriate at any dose  Plan  Recommended d/c Eliquis Recommended start warfarin for anticoagulation Continue current medications  Hyperlipidemia   LDL goal < 70  Lipid Panel     Component Value Date/Time   CHOL 171 05/01/2019 0859   TRIG 96 05/01/2019 0859   HDL 52 05/01/2019 0859   LDLCALC 101 (H) 05/01/2019 0859    Hepatic Function Latest Ref Rng & Units 10/10/2019 05/01/2019 10/31/2018  Total Protein 6.0 - 8.5 g/dL - 6.1 6.4  Albumin 3.7 - 4.7 g/dL 4.2 4.3 4.4  AST 0 - 40 IU/L - 32 21  ALT 0 - 44 IU/L - 35 25  Alk Phosphatase 39 - 117 IU/L - 178(H) 164(H)  Total Bilirubin 0.0 - 1.2 mg/dL - 0.7 1.1  Bilirubin, Direct 0.0 - 0.3 mg/dL - - -     The ASCVD Risk score ([place] Greenet al., 2013) failed to calculate for the following reasons:   The patient has a prior MI or stroke diagnosis   Patient has  failed these meds in past: NA Patient is currently uncontrolled on the following medications:  . Lipitor 104m10mily  We discussed:   Not at goal Denies myalgias  Plan  Continue current medications  Medication Management   We discussed: Mask fixed Still needs trazodone Doing self PAP Does renal function support Eliquis at all? Stopped aspirin?  Plan  Continue current medication management strategy  Follow up: 3 month phone visit  Ronald Scharnhorst Milus HeightarmD, BCGPOscodaTSNapili-[phone]

## 2020-03-13 NOTE — Patient Instructions (Signed)
Visit Information  Goals Addressed            This Visit's Progress    Chronic Care Management       CARE PLAN ENTRY (see longitudinal plan of care for additional care plan information)  Current Barriers:   Chronic Disease Management support, education, and care coordination needs related to Hypertension, Hyperlipidemia, and Chronic Kidney Disease   Hypertension BP Readings from Last 3 Encounters:  11/23/19 (!) 167/71  08/01/19 (!) 161/74  07/02/19 (!) 164/70    Pharmacist Clinical Goal(s): o Over the next 90 days, patient will work with PharmD and providers to achieve BP goal <140/90  Current regimen:  o Amlodipine 10mg  daily  Interventions: o Consider clonidine  Patient self care activities - Over the next 90 days, patient will: o Check BP daily, document, and provide at future appointments o Ensure daily salt intake < 2300 mg/day  Hyperlipidemia Lab Results  Component Value Date/Time   LDLCALC 101 (H) 05/01/2019 08:59 AM    Pharmacist Clinical Goal(s): o Over the next 90 days, patient will work with PharmD and providers to achieve LDL goal < 70  Current regimen:  o Lipitor 40mg  daily  Interventions: o Recommended increasing atorvastatin 80mg  daily  Patient self care activities - Over the next 90 days, patient will: o Work with provider to achieve LDL (bad cholesterol) goal  Heart Failure  Pharmacist Clinical Goal(s) o Over the next 90 days, patient will work with PharmD and providers to achieve accessible anticoagulant  Current regimen:  o Eliquis 5mg  twice daily  Interventions: o Recommended stopping Eliquis due to poor kidney function (GFR < 19 ml/min) o Recommended starting warfarin for anticoagulation.  Patient self care activities - Over the next 90 days, patient will: o Ask cardiologist about change to Eliquis for patient assistance program  Medication management  Pharmacist Clinical Goal(s): o Over the next 90 days, patient will work  with PharmD and providers to maintain optimal medication adherence  Current pharmacy: [facility]  Interventions o Comprehensive medication review performed. o Continue current medication management strategy  Patient self care activities - Over the next 90 days, patient will: o Focus on medication adherence by asking cardiology to switch to Eliquis o Take medications as prescribed o Report any questions or concerns to PharmD and/or provider(s)  Initial goal documentation        Print copy of patient instructions provided.   Telephone follow up appointment with pharmacy team member scheduled for: 3 months  Milus Height, PharmD, [place], [place] [phone]

## 2020-03-19 ENCOUNTER — Other Ambulatory Visit: Payer: Self-pay

## 2020-03-19 ENCOUNTER — Ambulatory Visit (INDEPENDENT_AMBULATORY_CARE_PROVIDER_SITE_OTHER): Payer: Medicare Other | Admitting: Family Medicine

## 2020-03-19 ENCOUNTER — Encounter: Payer: Self-pay | Admitting: Family Medicine

## 2020-03-19 VITALS — BP 158/63 | HR 54 | Temp 97.9°F | Resp 18 | Ht 69.0 in | Wt 259.0 lb

## 2020-03-19 DIAGNOSIS — R739 Hyperglycemia, unspecified: Secondary | ICD-10-CM

## 2020-03-19 DIAGNOSIS — I10 Essential (primary) hypertension: Secondary | ICD-10-CM

## 2020-03-19 DIAGNOSIS — E039 Hypothyroidism, unspecified: Secondary | ICD-10-CM

## 2020-03-19 DIAGNOSIS — Z23 Encounter for immunization: Secondary | ICD-10-CM

## 2020-03-19 DIAGNOSIS — G3184 Mild cognitive impairment, so stated: Secondary | ICD-10-CM

## 2020-03-19 DIAGNOSIS — E7800 Pure hypercholesterolemia, unspecified: Secondary | ICD-10-CM | POA: Diagnosis not present

## 2020-03-19 NOTE — Progress Notes (Signed)
I,Ronald [name],acting as a scribe for [name], MD.,have documented all relevant documentation on the behalf of [name], MD,as directed by  [name], MD while in the presence of [name], MD.   Established patient visit   Patient: Ronald [name] [facility]   DOB: [birth year]   74 y.o. Male  MRN: [id number] Visit Date: 03/19/2020  Today's healthcare provider: [name], MD   Chief Complaint  Patient presents with  . Follow-up  . Hyperlipidemia  . Hypertension  . Hyperglycemia   Subjective    HPI  Patient states wife is told him his memory is getting worse.  He has no complaints today, everything else is stable. Hypertension, follow-up  BP Readings from Last 3 Encounters:  03/19/20 (!) 158/63  01/25/20 (!) 144/60  01/18/20 (!) 158/82   Wt Readings from Last 3 Encounters:  03/19/20 259 lb (117.5 kg)  01/25/20 251 lb 6.4 oz (114 kg)  01/18/20 246 lb 6 oz (111.8 kg)     He was last seen for hypertension 3 months ago.  BP at that visit was 152/75. Management since that visit includes; Good control on metoprolol and amlodipine. He reports good compliance with treatment. He is not having side effects. none He is not exercising. He is adherent to low salt diet.   Outside blood pressures are not checking.  He does not smoke.  Use of agents associated with hypertension: none.   --------------------------------------------------------------------  Lipid/Cholesterol, follow-up  Last Lipid Panel: Lab Results  Component Value Date   CHOL 171 05/01/2019   LDLCALC 101 (H) 05/01/2019   HDL 52 05/01/2019   TRIG 96 05/01/2019    He was last seen for this 05/01/2019.  Management since that visit includes; On atorvastatin. He reports good compliance with treatment. He is not having side effects. none He is following a Regular, Low Sodium diet. Current exercise: none  Last metabolic panel Lab Results  Component Value Date    GLUCOSE 122 (H) 10/10/2019   NA 144 10/10/2019   K 5.3 (H) 10/10/2019   BUN 28 (H) 10/10/2019   CREATININE 2.67 (H) 10/10/2019   GFRNONAA 22 (L) 10/10/2019   GFRAA 26 (L) 10/10/2019   CALCIUM 9.5 10/10/2019   AST 32 05/01/2019   ALT 35 05/01/2019   The ASCVD Risk score (Goff DC Jr., et al., 2013) failed to calculate for the following reasons:   The patient has a prior MI or stroke diagnosis  --------------------------------------------------------------------  Hyperglycemia From 12/17/2019-A1c good today at 6.0.  Follow-up in 6 months.  Paroxysmal atrial fibrillation (HCC) From 12/17/2019-On Xarelto.  Also on amiodarone.      Medications: Outpatient Medications Prior to Visit  Medication Sig  . allopurinol (ZYLOPRIM) 100 MG tablet TAKE 1 TABLET BY MOUTH AT  BEDTIME  . amiodarone (PACERONE) 200 MG tablet TAKE 1 TABLET BY MOUTH  DAILY  . amLODipine (NORVASC) 10 MG tablet TAKE 1 TABLET BY MOUTH EVERY DAY  . apixaban (ELIQUIS) 5 MG TABS tablet Take 1 tablet (5 mg total) by mouth 2 (two) times daily.  [name] aspirin EC 81 MG tablet Take 1 tablet (81 mg total) by mouth daily.  [name] atorvastatin (LIPITOR) 40 MG tablet TAKE 1 TABLET BY MOUTH  DAILY  . Cholecalciferol (VITAMIN D-3) 125 MCG (5000 UT) TABS Take 2,000 Units by mouth daily.   . furosemide (LASIX) 40 MG tablet Take 40 mg by mouth daily as needed for fluid or edema.   . isosorbide  mononitrate (IMDUR) 30 MG 24 hr tablet TAKE 1 TABLET BY MOUTH  DAILY  . levothyroxine (SYNTHROID) 75 MCG tablet Take 1 tablet (75 mcg total) by mouth daily before breakfast.  . loratadine (CLARITIN) 10 MG tablet Take 10 mg by mouth daily as needed for allergies.  . magnesium oxide (MAG-OX) 400 MG tablet TAKE 1 TABLET BY MOUTH TWICE A DAY  . Melatonin 5 MG TABS Take 1 tablet by mouth at bedtime.  . metoprolol tartrate (LOPRESSOR) 100 MG tablet TAKE 1 TABLET BY MOUTH  TWICE DAILY  . Multiple Vitamin (MULTIVITAMIN PO) Take 1 tablet by mouth daily.  .  nitroGLYCERIN (NITROSTAT) 0.4 MG SL tablet Place 0.4 mg under the tongue every 5 (five) minutes as needed for chest pain (Up to 3 times).   . nystatin (NYSTATIN) powder Apply 1 application topically 3 (three) times daily.  . pantoprazole (PROTONIX) 40 MG tablet Take 2 tablets (80 mg total) by mouth daily. (Patient taking differently: Take 40 mg by mouth 2 (two) times daily. )  . sodium bicarbonate 650 MG tablet Take 650 mg by mouth 2 (two) times daily. Evening   No facility-administered medications prior to visit.    Review of Systems  Constitutional: Negative for appetite change, chills and fever.  Respiratory: Negative for chest tightness, shortness of breath and wheezing.   Cardiovascular: Negative for chest pain and palpitations.  Gastrointestinal: Negative for abdominal pain, nausea and vomiting.      Objective    BP (!) 158/63 (BP Location: Right Arm, Patient Position: Sitting, Cuff Size: Large)   Pulse (!) 54   Temp 97.9 F (36.6 C) (Oral)   Resp 18   Ht 5\' 9"  (1.753 m)   Wt 259 lb (117.5 kg)   SpO2 98%   BMI 38.25 kg/m    Physical Exam Vitals reviewed.  Constitutional:      Appearance: He is obese.  HENT:     Head: Normocephalic and atraumatic.     Right Ear: External ear normal.     Left Ear: External ear normal.  Eyes:     General: No scleral icterus.    Conjunctiva/sclera: Conjunctivae normal.  Cardiovascular:     Rate and Rhythm: Normal rate and regular rhythm.     Pulses: Normal pulses.     Heart sounds: Normal heart sounds.  Pulmonary:     Effort: Pulmonary effort is normal.     Breath sounds: Normal breath sounds.  Abdominal:     Palpations: Abdomen is soft.  Musculoskeletal:     Right lower leg: No edema.     Left lower leg: No edema.  Skin:    General: Skin is warm and dry.  Neurological:     General: No focal deficit present.     Mental Status: He is alert and oriented to person, place, and time.  Psychiatric:        Mood and Affect: Mood  normal.        Behavior: Behavior normal.        Thought Content: Thought content normal.        Judgment: Judgment normal.     BP (!) 158/63 (BP Location: Right Arm, Patient Position: Sitting, Cuff Size: Large)   Pulse (!) 54   Temp 97.9 F (36.6 C) (Oral)   Resp 18   Ht 5\' 9"  (1.753 m)   Wt 259 lb (117.5 kg)   SpO2 98%   BMI 38.25 kg/m     No results found for  any visits on 03/19/20.  Assessment & Plan     1. Hyperglycemia  - Hemoglobin A1c - Lipid panel - CBC w/Diff/Platelet - Comprehensive Metabolic Panel (CMET) - TSH  2. Essential hypertension  - Hemoglobin A1c - Lipid panel - CBC w/Diff/Platelet - Comprehensive Metabolic Panel (CMET) - TSH  3. Pure hypercholesterolemia  - Hemoglobin A1c - Lipid panel - CBC w/Diff/Platelet - Comprehensive Metabolic Panel (CMET) - TSH  4. Hypothyroidism, unspecified type  - Hemoglobin A1c - Lipid panel - CBC w/Diff/Platelet - Comprehensive Metabolic Panel (CMET) - TSH  5. Need for influenza vaccination  - Flu Vaccine QUAD High Dose(Fluad) 6.MCI MMSE is 24/30 today.  Consider Aricept.  Return in about 4 months (around 07/20/2020).         Taleen Prosser [place] Mon, MD  [facility] [phone] (phone) [phone] (fax)  [place]

## 2020-03-20 ENCOUNTER — Telehealth: Payer: Self-pay

## 2020-03-20 DIAGNOSIS — E039 Hypothyroidism, unspecified: Secondary | ICD-10-CM | POA: Diagnosis not present

## 2020-03-20 DIAGNOSIS — E7800 Pure hypercholesterolemia, unspecified: Secondary | ICD-10-CM | POA: Diagnosis not present

## 2020-03-20 DIAGNOSIS — I10 Essential (primary) hypertension: Secondary | ICD-10-CM | POA: Diagnosis not present

## 2020-03-20 DIAGNOSIS — R739 Hyperglycemia, unspecified: Secondary | ICD-10-CM | POA: Diagnosis not present

## 2020-03-20 NOTE — Telephone Encounter (Signed)
**Note De-Identified  Obfuscation** The pt left his completed [place] pt asst application for Eliquis at the office. I have completed the provider page and emailed all to Dr Alyse Low nurse so she can obtain his signature, date it and to fax to [facility] at fax number written on cover letter included.

## 2020-03-21 LAB — COMPREHENSIVE METABOLIC PANEL
ALT: 24 IU/L (ref 0–44)
AST: 21 IU/L (ref 0–40)
Albumin/Globulin Ratio: 2.4 — ABNORMAL HIGH (ref 1.2–2.2)
Albumin: 4.1 g/dL (ref 3.7–4.7)
Alkaline Phosphatase: 163 IU/L — ABNORMAL HIGH (ref 44–121)
BUN/Creatinine Ratio: 12 (ref 10–24)
BUN: 29 mg/dL — ABNORMAL HIGH (ref 8–27)
Bilirubin Total: 0.8 mg/dL (ref 0.0–1.2)
CO2: 19 mmol/L — ABNORMAL LOW (ref 20–29)
Calcium: 9.1 mg/dL (ref 8.6–10.2)
Chloride: 110 mmol/L — ABNORMAL HIGH (ref 96–106)
Creatinine, Ser: 2.46 mg/dL — ABNORMAL HIGH (ref 0.76–1.27)
GFR calc Af Amer: 28 mL/min/{1.73_m2} — ABNORMAL LOW (ref >59)
GFR calc non Af Amer: 25 mL/min/{1.73_m2} — ABNORMAL LOW (ref >59)
Globulin, Total: 1.7 g/dL (ref 1.5–4.5)
Glucose: 124 mg/dL — ABNORMAL HIGH (ref 65–99)
Potassium: 4.8 mmol/L (ref 3.5–5.2)
Sodium: 145 mmol/L — ABNORMAL HIGH (ref 134–144)
Total Protein: 5.8 g/dL — ABNORMAL LOW (ref 6.0–8.5)

## 2020-03-21 LAB — CBC WITH DIFFERENTIAL/PLATELET
Basophils Absolute: 0.1 10*3/uL (ref 0.0–0.2)
Basos: 1 %
EOS (ABSOLUTE): 0.3 10*3/uL (ref 0.0–0.4)
Eos: 5 %
Hematocrit: 37.8 % (ref 37.5–51.0)
Hemoglobin: 11.9 g/dL — ABNORMAL LOW (ref 13.0–17.7)
Immature Grans (Abs): 0 10*3/uL (ref 0.0–0.1)
Immature Granulocytes: 0 %
Lymphocytes Absolute: 0.7 10*3/uL (ref 0.7–3.1)
Lymphs: 12 %
MCH: 27.7 pg (ref 26.6–33.0)
MCHC: 31.5 g/dL (ref 31.5–35.7)
MCV: 88 fL (ref 79–97)
Monocytes Absolute: 0.7 10*3/uL (ref 0.1–0.9)
Monocytes: 12 %
Neutrophils Absolute: 4 10*3/uL (ref 1.4–7.0)
Neutrophils: 70 %
Platelets: 128 10*3/uL — ABNORMAL LOW (ref 150–450)
RBC: 4.29 x10E6/uL (ref 4.14–5.80)
RDW: 14.5 % (ref 11.6–15.4)
WBC: 5.7 10*3/uL (ref 3.4–10.8)

## 2020-03-21 LAB — LIPID PANEL
Chol/HDL Ratio: 2.8 ratio (ref 0.0–5.0)
Cholesterol, Total: 147 mg/dL (ref 100–199)
HDL: 52 mg/dL (ref >39)
LDL Chol Calc (NIH): 80 mg/dL (ref 0–99)
Triglycerides: 80 mg/dL (ref 0–149)
VLDL Cholesterol Cal: 15 mg/dL (ref 5–40)

## 2020-03-21 LAB — HEMOGLOBIN A1C
Est. average glucose Bld gHb Est-mCnc: 123 mg/dL
Hgb A1c MFr Bld: 5.9 % — ABNORMAL HIGH (ref 4.8–5.6)

## 2020-03-21 LAB — TSH: TSH: 5.33 u[IU]/mL — ABNORMAL HIGH (ref 0.450–4.500)

## 2020-03-25 NOTE — Telephone Encounter (Signed)
Signed form/packet faxed to [phone].

## 2020-03-27 NOTE — Telephone Encounter (Signed)
Confirmation that fax was received when resent 03/26/20 13:41 pm.

## 2020-04-07 NOTE — Telephone Encounter (Signed)
refaxed all 12 pages of correspondence at this time. Called patient's wife and let her know that all has been resent.

## 2020-04-14 ENCOUNTER — Other Ambulatory Visit: Payer: Self-pay | Admitting: Family Medicine

## 2020-04-23 NOTE — Progress Notes (Signed)
I,[name],acting as a scribe for [name], MD.,have documented all relevant documentation on the behalf of [name], MD,as directed by  [name], MD while in the presence of [name], MD.   Complete physical exam   Patient: Ronald [name] [facility]   DOB: [birth year]   74 y.o. Male  MRN: [id number] Visit Date: 04/28/2020  Today's healthcare provider: [name], MD   Chief Complaint  Patient presents with   Annual Exam   Subjective    Ronald [name] is a 74 y.o. male who presents today for a complete physical exam.  He reports consuming a general diet. The patient does not participate in regular exercise at present. He generally feels poorly. He reports sleeping poorly. He does have additional problems to discuss today.  HPI  Fatigue, shortness of breath. Loss of taste.  Patient states he lost a sense of taste after second Covid shot.  He has chronic dyspnea on exertion which is slowly getting worse.  He sleeps in a recliner because of his breathing.  He does wear his CPAP nightly for OSA.   Patient had AWV with NHA today at 1:20 pm.  Past Medical History:  Diagnosis Date   AAA (abdominal aortic aneurysm) ([place])    a. 3cm by [place] 2015.   Arthritis    "hips; back" (12/13/2014)   CAD (coronary artery disease) 2007   a. s/p CABG- IMA-LAD, VG-Cx, VG-RCA, VG-diag in 1999. B. sp redo CABG- VG-OM, VG-RCA in 2007 due to VG disease. c. NSTEMI 11/2014 s/p DES to SVG-OM from the Y graft.d. PTCA/DES x 1 distal body of SVG to Diagonal.09/2015   Chronic combined systolic and diastolic CHF (congestive heart failure) ([place])    a. remote EF 40-45% in 2006. b. Normal EF 2014. b. Echo 07/2016 EF 45-50%, grade 1 DD.   Chronic lower back pain    CKD (chronic kidney disease), stage IV (HCC)    COPD (chronic obstructive pulmonary disease) (HCC)    Deafness in left ear    Degenerative disc disease, lumbar    Dilated cardiomyopathy ([place]) 10/07/2015    Emphysema    Esophageal stricture 07/02/1998   EGD   GERD (gastroesophageal reflux disease)    History of gout    "last flareup was in 2007" (12/13/2014)   History of hiatal hernia    Hyperlipidemia    Hypertension    Ischemic cardiomyopathy 2006   EF 40% to 50% by 2D echo in 2006;  Echo 12/31/12: Mild LVH, EF 50-55%, normal wall motion.    PVC's (premature ventricular contractions)    Renal artery stenosis ([place])    a. noted on CT 2008.   Type II diabetes mellitus (HCC)    Diet control    Walking pneumonia 1990's   Past Surgical History:  Procedure Laterality Date   CARDIAC CATHETERIZATION  "several"   CARDIAC CATHETERIZATION N/A 12/13/2014   Procedure: Left Heart Cath and Coronary Angiography;  Surgeon: Jettie Booze, MD;  Location: [place] [facility];  Service: Cardiovascular;  Laterality: N/A;   CARDIAC CATHETERIZATION  12/13/2014   Procedure: Coronary Stent Intervention;  Surgeon: Jettie Booze, MD;  Location: [place] [facility];  Service: Cardiovascular;;   CARDIAC CATHETERIZATION N/A 10/07/2015   Procedure: Left Heart Cath and Cors/Grafts Angiography;  Surgeon: Burnell Blanks, MD;  Location: [place] [facility];  Service: Cardiovascular;  Laterality: N/A;   CARDIAC CATHETERIZATION N/A 10/07/2015   Procedure: Coronary Stent  Intervention;  Surgeon: Burnell Blanks, MD;  Location: [place] [facility];  Service: Cardiovascular;  Laterality: N/A;   CORONARY ANGIOPLASTY  "several"   CORONARY ANGIOPLASTY WITH STENT PLACEMENT  2005; 12/13/2014   "2; 1"   CORONARY ARTERY BYPASS GRAFT  1996   CABG X5   CORONARY ARTERY BYPASS GRAFT  March 2007   CABG X3   ESOPHAGOGASTRODUODENOSCOPY (EGD) WITH ESOPHAGEAL DILATION  2000   GREEN LIGHT LASER TURP (TRANSURETHRAL RESECTION OF PROSTATE  2000's   "not cancerous"   HERNIA REPAIR     LAPAROSCOPIC CHOLECYSTECTOMY     LEFT HEART CATH AND CORS/GRAFTS ANGIOGRAPHY N/A 07/28/2017   Procedure: LEFT HEART CATH  AND CORS/GRAFTS ANGIOGRAPHY;  Surgeon: Troy Sine, MD;  Location: [place] [facility];  Service: Cardiovascular;  Laterality: N/A;   LUNG SURGERY  1996   "S/P CABG, had to put staple in lung after it had collapsed"   UMBILICAL HERNIA REPAIR     w/chole   Social History   Socioeconomic History   Marital status: Married    Spouse name: Not on file   Number of children: 2   Years of education: Not on file   Highest education level: 8th grade  Occupational History   Occupation: Retired  Tobacco Use   Smoking status: Former Smoker    Packs/day: 3.00    Years: 20.00    Pack years: 60.00    Types: Cigarettes    Quit date: 07/25/1986    Years since quitting: 33.7   Smokeless tobacco: Never Used  Vaping Use   Vaping Use: Never used  Substance and Sexual Activity   Alcohol use: No    Alcohol/week: 0.0 standard drinks   Drug use: No   Sexual activity: Not Currently  Other Topics Concern   Not on file  Social History Narrative   Did auto salvage work.   Lives at home with his wife.  Independent at baseline.   Social Determinants of Health   Financial Resource Strain: Low Risk    Difficulty of Paying Living Expenses: Not hard at all  Food Insecurity: No Food Insecurity   Worried About Charity fundraiser in the Last Year: Never true   [place] in the Last Year: Never true  Transportation Needs: No Transportation Needs   Lack of Transportation (Medical): No   Lack of Transportation (Non-Medical): No  Physical Activity: Inactive   Days of Exercise per Week: 0 days   Minutes of Exercise per Session: 0 min  Stress: No Stress Concern Present   Feeling of Stress : Not at all  Social Connections: Moderately Isolated   Frequency of Communication with Friends and Family: Once a week   Frequency of Social Gatherings with Friends and Family: More than three times a week   Attends Religious Services: Never   Marine scientist or Organizations:  No   Attends Music therapist: Never   Marital Status: Married  Human resources officer Violence: Not At Risk   Fear of Current or Ex-Partner: No   Emotionally Abused: No   Physically Abused: No   Sexually Abused: No   Family Status  Relation Name Status   Mother  Deceased at age 64       died of asthma   Father  Deceased at age 19       died of rheumatic heart disease   Brother  Alive   Son  Alive   Son  Alive  MGM  Deceased   MGF  Deceased   PGM  Deceased   PGF  Deceased   Neg Hx  (Not Specified)   Family History  Problem Relation Age of Onset   Heart attack Mother        MI   Stroke Mother    Heart disease Mother    Hypertension Mother    Hyperlipidemia Mother    Heart disease Father    Rheumatic fever Father    Colon cancer Neg Hx    Allergies  Allergen Reactions   Predicort [Prednisolone] Other (See Comments)    Stomach pain   Ciprofloxacin     GI upset   Hydrochlorothiazide Other (See Comments)    Dehydration   Hydrocodone     Stomach upset   Hydrocodone-Acetaminophen     Stomach upset   Hydrocodone-Acetaminophen Nausea Only   Loratadine Other (See Comments)    Other reaction(s): Unknown   Sulfa Antibiotics Other (See Comments)    Cannot recall   Sulfacetamide Sodium Other (See Comments)    Cannot recall   Sulfasalazine     Other reaction(s): Other (See Comments) Cannot recall   Hydrocodone-Acetaminophen Nausea Only   Penicillins Hives and Rash    Has patient had a PCN reaction causing immediate rash, facial/tongue/throat swelling, SOB or lightheadedness with hypotension: YES Has patient had a PCN reaction causing severe rash involving mucus membranes or skin necrosis: NO Has patient had a PCN reaction that required hospitalization NO Has patient had a PCN reaction occurring within the last 10 years:NO If all of the above answers are "NO", then may proceed with Cephalosporin use.    Patient Care  Team: [name]., MD as PCP - General (Unknown Physician Specialty) Burnell Blanks, MD as PCP - Cardiology (Cardiology) Murlean Iba, MD as Consulting Physician (Internal Medicine) Eulogio Bear, MD as Consulting Physician (Ophthalmology) Margaretha Sheffield, MD (Otolaryngology)   Medications: Outpatient Medications Prior to Visit  Medication Sig   allopurinol (ZYLOPRIM) 100 MG tablet TAKE 1 TABLET BY MOUTH AT  BEDTIME   amiodarone (PACERONE) 200 MG tablet TAKE 1 TABLET BY MOUTH  DAILY   amLODipine (NORVASC) 10 MG tablet TAKE 1 TABLET BY MOUTH EVERY DAY   apixaban (ELIQUIS) 5 MG TABS tablet Take 1 tablet (5 mg total) by mouth 2 (two) times daily.   aspirin EC 81 MG tablet Take 1 tablet (81 mg total) by mouth daily.   atorvastatin (LIPITOR) 40 MG tablet TAKE 1 TABLET BY MOUTH  DAILY   Cholecalciferol (VITAMIN D-3) 125 MCG (5000 UT) TABS Take 2,000 Units by mouth daily.    furosemide (LASIX) 40 MG tablet Take 40 mg by mouth daily as needed for fluid or edema.    isosorbide mononitrate (IMDUR) 30 MG 24 hr tablet TAKE 1 TABLET BY MOUTH  DAILY   levothyroxine (SYNTHROID) 75 MCG tablet Take 1 tablet (75 mcg total) by mouth daily before breakfast.   loratadine (CLARITIN) 10 MG tablet Take 10 mg by mouth daily as needed for allergies.   magnesium oxide (MAG-OX) 400 MG tablet TAKE 1 TABLET BY MOUTH TWICE A DAY   Melatonin 5 MG TABS Take 1 tablet by mouth at bedtime.   metoprolol tartrate (LOPRESSOR) 100 MG tablet TAKE 1 TABLET BY MOUTH  TWICE DAILY   Multiple Vitamin (MULTIVITAMIN PO) Take 1 tablet by mouth daily.   nitroGLYCERIN (NITROSTAT) 0.4 MG SL tablet Place 0.4 mg under the tongue every 5 (five) minutes as needed for  chest pain (Up to 3 times).    nystatin (NYSTATIN) powder Apply 1 application topically 3 (three) times daily.   pantoprazole (PROTONIX) 40 MG tablet TAKE 2 TABLETS BY MOUTH  DAILY   polyethylene glycol (MIRALAX / GLYCOLAX) 17 g packet  Take 17 g by mouth daily.   sodium bicarbonate 650 MG tablet Take 650 mg by mouth 2 (two) times daily. Evening   No facility-administered medications prior to visit.    Review of Systems  Constitutional: Positive for fatigue.  Psychiatric/Behavioral: Positive for sleep disturbance.  All other systems reviewed and are negative.      Objective    BP (!) 172/68 (BP Location: Right Arm, Patient Position: Sitting, Cuff Size: Large)    Pulse 61    Temp 98.4 F (36.9 C) (Oral)    Resp 18    Ht 5\' 9"  (1.753 m)    Wt 255 lb (115.7 kg)    SpO2 98%    BMI 37.66 kg/m  BP Readings from Last 3 Encounters:  04/28/20 (!) 172/68  03/19/20 (!) 158/63  01/25/20 (!) 144/60   Wt Readings from Last 3 Encounters:  04/28/20 255 lb (115.7 kg)  03/19/20 259 lb (117.5 kg)  01/25/20 251 lb 6.4 oz (114 kg)      Physical Exam Vitals reviewed.  Constitutional:      Appearance: He is well-developed. He is obese.     Comments: Significant truncal obesity.  HENT:     Head: Normocephalic and atraumatic.     Right Ear: External ear normal.     Left Ear: External ear normal.     Nose: Nose normal.  Eyes:     General: No scleral icterus.    Conjunctiva/sclera: Conjunctivae normal.  Neck:     Thyroid: No thyromegaly.  Cardiovascular:     Rate and Rhythm: Normal rate and regular rhythm.     Heart sounds: Normal heart sounds.  Pulmonary:     Effort: Pulmonary effort is normal.     Breath sounds: Normal breath sounds.  Abdominal:     Palpations: Abdomen is soft.     Comments: Trunk obesity  Musculoskeletal:     Right lower leg: No edema.     Left lower leg: No edema.  Skin:    General: Skin is warm and dry.  Neurological:     Mental Status: He is alert and oriented to person, place, and time. Mental status is at baseline.  Psychiatric:        Mood and Affect: Mood normal.        Behavior: Behavior normal.        Thought Content: Thought content normal.        Judgment: Judgment normal.      ECG reveals normal sinus rhythm.  A lot of artifact present.  Frequent PVC.  No ischemic changes noted.  Last depression screening scores PHQ 2/9 Scores 04/28/2020 03/19/2020 04/19/2019  PHQ - 2 Score 0 0 0  PHQ- 9 Score - 9 -  Exception Documentation - - -   Last fall risk screening Fall Risk  04/28/2020  Falls in the past year? 0  Number falls in past yr: 0  Injury with Fall? 0  Risk for fall due to : -  Risk for fall due to: Comment -  Follow up -   Last Audit-C alcohol use screening Alcohol Use Disorder Test (AUDIT) 04/28/2020  1. How often do you have a drink containing alcohol? 0  2. How  many drinks containing alcohol do you have on a typical day when you are drinking? 0  3. How often do you have six or more drinks on one occasion? 0  AUDIT-C Score 0  Alcohol Brief Interventions/Follow-up AUDIT Score <7 follow-up not indicated   A score of 3 or more in women, and 4 or more in men indicates increased risk for alcohol abuse, EXCEPT if all of the points are from question 1   No results found for any visits on 04/28/20.  Assessment & Plan    Routine Health Maintenance and Physical Exam  Exercise Activities and Dietary recommendations Goals     Chronic Care Management     CARE PLAN ENTRY (see longitudinal plan of care for additional care plan information)  Current Barriers:   Chronic Disease Management support, education, and care coordination needs related to Hypertension, Hyperlipidemia, and Chronic Kidney Disease   Hypertension BP Readings from Last 3 Encounters:  11/23/19 (!) 167/71  08/01/19 (!) 161/74  07/02/19 (!) 164/70    Pharmacist Clinical Goal(s): o Over the next 90 days, patient will work with PharmD and providers to achieve BP goal <140/90  Current regimen:  o Amlodipine 10mg  daily  Interventions: o Consider clonidine  Patient self care activities - Over the next 90 days, patient will: o Check BP daily, document, and provide at future  appointments o Ensure daily salt intake < 2300 mg/day  Hyperlipidemia Lab Results  Component Value Date/Time   LDLCALC 101 (H) 05/01/2019 08:59 AM    Pharmacist Clinical Goal(s): o Over the next 90 days, patient will work with PharmD and providers to achieve LDL goal < 70  Current regimen:  o Lipitor 40mg  daily  Interventions: o Recommended increasing atorvastatin 80mg  daily  Patient self care activities - Over the next 90 days, patient will: o Work with provider to achieve LDL (bad cholesterol) goal  Heart Failure  Pharmacist Clinical Goal(s) o Over the next 90 days, patient will work with PharmD and providers to achieve accessible anticoagulant  Current regimen:  o Eliquis 5mg  twice daily  Interventions: o Recommended stopping Eliquis due to poor kidney function (GFR < 19 ml/min) o Recommended starting warfarin for anticoagulation.  Patient self care activities - Over the next 90 days, patient will: o Ask cardiologist about change to Eliquis for patient assistance program  Medication management  Pharmacist Clinical Goal(s): o Over the next 90 days, patient will work with PharmD and providers to maintain optimal medication adherence  Current pharmacy: [facility]  Interventions o Comprehensive medication review performed. o Continue current medication management strategy  Patient self care activities - Over the next 90 days, patient will: o Focus on medication adherence by asking cardiology to switch to Eliquis o Take medications as prescribed o Report any questions or concerns to PharmD and/or provider(s)  Initial goal documentation      Cut out extra servings     Recommend to cut out all snacks and sweets after dinner to help aid in weight loss.      Increase water intake     Recommend to increase water intake to 6-8 glasses a day.       Immunization History  Administered Date(s) Administered   Fluad Quad(high Dose 65+) 03/01/2019, 03/19/2020    Influenza, High Dose Seasonal PF 03/18/2015, 03/02/2016, 02/13/2018   Influenza-Unspecified 03/14/2017   Moderna SARS-COVID-2 Vaccination 07/28/2019, 08/25/2019   Pneumococcal Conjugate-13 12/25/2013   Pneumococcal Polysaccharide-23 03/04/2009   Td 10/10/2003, 04/16/2016    Health Maintenance  Topic Date Due   FOOT EXAM  01/11/2018   URINE MICROALBUMIN  04/29/2020   COLONOSCOPY  04/28/2021 (Originally 11/19/2019)   OPHTHALMOLOGY EXAM  06/14/2020   HEMOGLOBIN A1C  09/18/2020   TETANUS/TDAP  04/16/2026   INFLUENZA VACCINE  Completed   COVID-19 Vaccine  Completed   Hepatitis C Screening  Completed   PNA vac Low Risk Adult  Completed    Discussed health benefits of physical activity, and encouraged him to engage in regular exercise appropriate for his age and condition.  1. Annual physical exam   2. Dyspnea on exertion I think he has significant truncal obesity which limits diaphragmatic excursion. With his heart disease he may have some underlying CHF.  Obtain chest x-ray.  May need referral back to cardiology.  May also need pulmonary evaluation - EKG 12-Lead  3. Congestive heart failure, unspecified HF chronicity, unspecified heart failure type ([place]) He has chronic heart disease and some orthopnea by description.  He sleeps in a recliner frequently Patient also has significant chronic kidney disease.  He has no chest pain or chest tightness. Last EF was 30 to 35%. - DG Chest 2 View   Return in about 2 months (around 06/28/2020).     I, [name], MD, have reviewed all documentation for this visit. The documentation on 05/01/20 for the exam, diagnosis, procedures, and orders are all accurate and complete.    Lis Savitt Cranford Mon, MD  [facility] [phone] (phone) [phone] (fax)  [place]

## 2020-04-25 NOTE — Progress Notes (Signed)
Subjective:   Ronald [name] is a 74 y.o. male who presents for Medicare Annual/Subsequent preventive examination.  I connected with Edinson Guin today by telephone and verified that I am speaking with the correct person using two identifiers. Location patient: home Location provider: work Persons participating in the virtual visit: patient, provider.   I discussed the limitations, risks, security and privacy concerns of performing an evaluation and management service by telephone and the availability of in person appointments. I also discussed with the patient that there may be a patient responsible charge related to this service. The patient expressed understanding and verbally consented to this telephonic visit.    Interactive audio and video telecommunications were attempted between this provider and patient, however failed, due to patient having technical difficulties OR patient did not have access to video capability.  We continued and completed visit with audio only.   Review of Systems    N/A  Cardiac Risk Factors include: advanced age (>56men, >19 women);diabetes mellitus;male gender;obesity (BMI >30kg/m2);hypertension;dyslipidemia     Objective:    There were no vitals filed for this visit. There is no height or weight on file to calculate BMI.  Advanced Directives 04/28/2020 04/19/2019 06/10/2018 04/17/2018 07/27/2017 07/08/2017 01/21/2017  Does Patient Have a Medical Advance Directive? No No No Yes No No No  Type of Advance Directive - - - Living will - - -  Does patient want to make changes to medical advance directive? - - - - - - Yes (ED - Information included in AVS)  Would patient like information on creating a medical advance directive? No - Patient declined No - Patient declined No - Patient declined - No - Patient declined - -  Pre-existing out of facility DNR order (yellow form or pink MOST form) - - - - - - -    Current Medications (verified) Outpatient Encounter  Medications as of 04/28/2020  Medication Sig  . allopurinol (ZYLOPRIM) 100 MG tablet TAKE 1 TABLET BY MOUTH AT  BEDTIME  . amiodarone (PACERONE) 200 MG tablet TAKE 1 TABLET BY MOUTH  DAILY  . amLODipine (NORVASC) 10 MG tablet TAKE 1 TABLET BY MOUTH EVERY DAY  . apixaban (ELIQUIS) 5 MG TABS tablet Take 1 tablet (5 mg total) by mouth 2 (two) times daily.  [name] aspirin EC 81 MG tablet Take 1 tablet (81 mg total) by mouth daily.  [name] atorvastatin (LIPITOR) 40 MG tablet TAKE 1 TABLET BY MOUTH  DAILY  . Cholecalciferol (VITAMIN D-3) 125 MCG (5000 UT) TABS Take 2,000 Units by mouth daily.   . furosemide (LASIX) 40 MG tablet Take 40 mg by mouth daily as needed for fluid or edema.   . isosorbide mononitrate (IMDUR) 30 MG 24 hr tablet TAKE 1 TABLET BY MOUTH  DAILY  . levothyroxine (SYNTHROID) 75 MCG tablet Take 1 tablet (75 mcg total) by mouth daily before breakfast.  . loratadine (CLARITIN) 10 MG tablet Take 10 mg by mouth daily as needed for allergies.  . magnesium oxide (MAG-OX) 400 MG tablet TAKE 1 TABLET BY MOUTH TWICE A DAY  . Melatonin 5 MG TABS Take 1 tablet by mouth at bedtime.  . metoprolol tartrate (LOPRESSOR) 100 MG tablet TAKE 1 TABLET BY MOUTH  TWICE DAILY  . Multiple Vitamin (MULTIVITAMIN PO) Take 1 tablet by mouth daily.  . nitroGLYCERIN (NITROSTAT) 0.4 MG SL tablet Place 0.4 mg under the tongue every 5 (five) minutes as needed for chest pain (Up to 3 times).   . pantoprazole (  PROTONIX) 40 MG tablet TAKE 2 TABLETS BY MOUTH  DAILY  . polyethylene glycol (MIRALAX / GLYCOLAX) 17 g packet Take 17 g by mouth daily.  . sodium bicarbonate 650 MG tablet Take 650 mg by mouth 2 (two) times daily. Evening  . nystatin (NYSTATIN) powder Apply 1 application topically 3 (three) times daily. (Patient not taking: Reported on 04/28/2020)   No facility-administered encounter medications on file as of 04/28/2020.    Allergies (verified) Predicort [prednisolone], Ciprofloxacin, Hydrochlorothiazide,  Hydrocodone, Hydrocodone-acetaminophen, Hydrocodone-acetaminophen, Loratadine, Sulfa antibiotics, Sulfacetamide sodium, Sulfasalazine, Hydrocodone-acetaminophen, and Penicillins   History: Past Medical History:  Diagnosis Date  . AAA (abdominal aortic aneurysm) ([place])    a. 3cm by [place] 2015.  [name] Arthritis    "hips; back" (12/13/2014)  . CAD (coronary artery disease) 2007   a. s/p CABG- IMA-LAD, VG-Cx, VG-RCA, VG-diag in 1999. B. sp redo CABG- VG-OM, VG-RCA in 2007 due to VG disease. c. NSTEMI 11/2014 s/p DES to SVG-OM from the Y graft.d. PTCA/DES x 1 distal body of SVG to Diagonal.09/2015  . Chronic combined systolic and diastolic CHF (congestive heart failure) ([place])    a. remote EF 40-45% in 2006. b. Normal EF 2014. b. Echo 07/2016 EF 45-50%, grade 1 DD.  [name] Chronic lower back pain   . CKD (chronic kidney disease), stage IV ([place])   . COPD (chronic obstructive pulmonary disease) ([facility])   . Deafness in left ear   . Degenerative disc disease, lumbar   . Dilated cardiomyopathy ([place]) 10/07/2015  . Emphysema   . Esophageal stricture 07/02/1998   EGD  . GERD (gastroesophageal reflux disease)   . History of gout    "last flareup was in 2007" (12/13/2014)  . History of hiatal hernia   . Hyperlipidemia   . Hypertension   . Ischemic cardiomyopathy 2006   EF 40% to 50% by 2D echo in 2006;  Echo 12/31/12: Mild LVH, EF 50-55%, normal wall motion.   [name] PVC's (premature ventricular contractions)   . Renal artery stenosis ([place])    a. noted on CT 2008.  . Type II diabetes mellitus ([place])    Diet control   . Walking pneumonia 1990's   Past Surgical History:  Procedure Laterality Date  . CARDIAC CATHETERIZATION  "several"  . CARDIAC CATHETERIZATION N/A 12/13/2014   Procedure: Left Heart Cath and Coronary Angiography;  Surgeon: Jettie Booze, MD;  Location: [facility];  Service: Cardiovascular;  Laterality: N/A;  . CARDIAC CATHETERIZATION  12/13/2014   Procedure: Coronary Stent Intervention;   Surgeon: Jettie Booze, MD;  Location: [facility];  Service: Cardiovascular;;  . CARDIAC CATHETERIZATION N/A 10/07/2015   Procedure: Left Heart Cath and Cors/Grafts Angiography;  Surgeon: Burnell Blanks, MD;  Location:  [facility];  Service: Cardiovascular;  Laterality: N/A;  . CARDIAC CATHETERIZATION N/A 10/07/2015   Procedure: Coronary Stent Intervention;  Surgeon: Burnell Blanks, MD;  Location: [facility];  Service: Cardiovascular;  Laterality: N/A;  . CORONARY ANGIOPLASTY  "several"  . CORONARY ANGIOPLASTY WITH STENT PLACEMENT  2005; 12/13/2014   "2; 1"  . CORONARY ARTERY BYPASS GRAFT  1996   CABG X5  . CORONARY ARTERY BYPASS GRAFT  March 2007   CABG X3  . ESOPHAGOGASTRODUODENOSCOPY (EGD) WITH ESOPHAGEAL DILATION  2000  . GREEN LIGHT LASER TURP (TRANSURETHRAL RESECTION OF PROSTATE  2000's   "not cancerous"  . HERNIA REPAIR    . LAPAROSCOPIC CHOLECYSTECTOMY    . LEFT HEART CATH AND CORS/GRAFTS  ANGIOGRAPHY N/A 07/28/2017   Procedure: LEFT HEART CATH AND CORS/GRAFTS ANGIOGRAPHY;  Surgeon: Troy Sine, MD;  Location: [facility];  Service: Cardiovascular;  Laterality: N/A;  . LUNG SURGERY  1996   "S/P CABG, had to put staple in lung after it had collapsed"  . UMBILICAL HERNIA REPAIR     w/chole   Family History  Problem Relation Age of Onset  . Heart attack Mother        MI  . Stroke Mother   . Heart disease Mother   . Hypertension Mother   . Hyperlipidemia Mother   . Heart disease Father   . Rheumatic fever Father   . Colon cancer Neg Hx    Social History   Socioeconomic History  . Marital status: Married    Spouse name: Not on file  . Number of children: 2  . Years of education: Not on file  . Highest education level: 8th grade  Occupational History  . Occupation: Retired  Tobacco Use  . Smoking status: Former Smoker    Packs/day: 3.00    Years: 20.00    Pack years: 60.00    Types: Cigarettes    Quit date:  07/25/1986    Years since quitting: 33.7  . Smokeless tobacco: Never Used  Vaping Use  . Vaping Use: Never used  Substance and Sexual Activity  . Alcohol use: No    Alcohol/week: 0.0 standard drinks  . Drug use: No  . Sexual activity: Not Currently  Other Topics Concern  . Not on file  Social History Narrative   Did auto salvage work.   Lives at home with his wife.  Independent at baseline.   Social Determinants of Health   Financial Resource Strain: Low Risk   . Difficulty of Paying Living Expenses: Not hard at all  Food Insecurity: No Food Insecurity  . Worried About Charity fundraiser in the Last Year: Never true  . Ran Out of Food in the Last Year: Never true  Transportation Needs: No Transportation Needs  . Lack of Transportation (Medical): No  . Lack of Transportation (Non-Medical): No  Physical Activity: Inactive  . Days of Exercise per Week: 0 days  . Minutes of Exercise per Session: 0 min  Stress: No Stress Concern Present  . Feeling of Stress : Not at all  Social Connections: Moderately Isolated  . Frequency of Communication with Friends and Family: Once a week  . Frequency of Social Gatherings with Friends and Family: More than three times a week  . Attends Religious Services: Never  . Active Member of Clubs or Organizations: No  . Attends Archivist Meetings: Never  . Marital Status: Married    Tobacco Counseling Counseling given: Not Answered   Clinical Intake:  Pre-visit preparation completed: Yes  Pain : No/denies pain     Nutritional Risks: None Diabetes: Yes  How often do you need to have someone help you when you read instructions, pamphlets, or other written materials from your doctor or pharmacy?: 1 - Never  Diabetic? Yes  Nutrition Risk Assessment:  Has the patient had any N/V/D within the last 2 months?  No  Does the patient have any non-healing wounds?  No  Has the patient had any unintentional weight loss or weight gain?   No   Diabetes:  Is the patient diabetic? Prediabetic If diabetic, was a CBG obtained today?  No  Did the patient bring in their glucometer from home?  No  How often do you monitor your CBG's? Does not check.   Diabetic Exams:  Diabetic Eye Exam: Completed 06/15/19 Diabetic Foot Exam: Overdue, pt has been advised about the importance in completing this exam. Note made to complete this at next in office apt.    Interpreter Needed?: No  Information entered by :: [facility], LPN   Activities of Daily Living In your present state of health, do you have any difficulty performing the following activities: 04/28/2020 03/19/2020  Hearing? Y Y  Comment Is deaf in left ear. -  Vision? N Y  Difficulty concentrating or making decisions? Y Y  Comment Has the beginnings of dementia. -  Walking or climbing stairs? Y Y  Comment Due to hip pains. -  Dressing or bathing? N N  Doing errands, shopping? N N  Preparing Food and eating ? N -  Using the Toilet? N -  In the past six months, have you accidently leaked urine? Y -  Comment Due to an enlarged prostate. -  Do you have problems with loss of bowel control? N -  Managing your Medications? N -  Managing your Finances? N -  Housekeeping or managing your Housekeeping? N -  Some recent data might be hidden    Patient Care Team: [name]., MD as PCP - General (Unknown Physician Specialty) Burnell Blanks, MD as PCP - Cardiology (Cardiology) Murlean Iba, MD as Consulting Physician (Internal Medicine) Eulogio Bear, MD as Consulting Physician (Ophthalmology) Margaretha Sheffield, MD (Otolaryngology)  Indicate any recent Medical Services you may have received from other than [facility] providers in the past year (date may be approximate).     Assessment:   This is a routine wellness examination for D[place]lon.  Hearing/Vision screen No exam data present  Dietary issues and exercise activities discussed: Current Exercise  Habits: The patient does not participate in regular exercise at present, Exercise limited by: orthopedic condition(s)  Goals    . Chronic Care Management     CARE PLAN ENTRY (see longitudinal plan of care for additional care plan information)  Current Barriers:  . Chronic Disease Management support, education, and care coordination needs related to Hypertension, Hyperlipidemia, and Chronic Kidney Disease   Hypertension BP Readings from Last 3 Encounters:  11/23/19 (!) 167/71  08/01/19 (!) 161/74  07/02/19 (!) 164/70   . Pharmacist Clinical Goal(s): o Over the next 90 days, patient will work with PharmD and providers to achieve BP goal <140/90 . Current regimen:  o Amlodipine 10mg  daily . Interventions: o Consider clonidine . Patient self care activities - Over the next 90 days, patient will: o Check BP daily, document, and provide at future appointments o Ensure daily salt intake < 2300 mg/day  Hyperlipidemia Lab Results  Component Value Date/Time   LDLCALC 101 (H) 05/01/2019 08:59 AM   . Pharmacist Clinical Goal(s): o Over the next 90 days, patient will work with PharmD and providers to achieve LDL goal < 70 . Current regimen:  o Lipitor 40mg  daily . Interventions: o Recommended increasing atorvastatin 80mg  daily . Patient self care activities - Over the next 90 days, patient will: o Work with provider to achieve LDL (bad cholesterol) goal  Heart Failure . Pharmacist Clinical Goal(s) o Over the next 90 days, patient will work with PharmD and providers to achieve accessible anticoagulant . Current regimen:  o Eliquis 5mg  twice daily . Interventions: o Recommended stopping Eliquis due to poor kidney function (GFR < 19 ml/min) o Recommended  starting warfarin for anticoagulation. . Patient self care activities - Over the next 90 days, patient will: o Ask cardiologist about change to Eliquis for patient assistance program  Medication management . Pharmacist Clinical  Goal(s): o Over the next 90 days, patient will work with PharmD and providers to maintain optimal medication adherence . Current pharmacy: [facility] . Interventions o Comprehensive medication review performed. o Continue current medication management strategy . Patient self care activities - Over the next 90 days, patient will: o Focus on medication adherence by asking cardiology to switch to Eliquis o Take medications as prescribed o Report any questions or concerns to PharmD and/or provider(s)  Initial goal documentation     . Cut out extra servings     Recommend to cut out all snacks and sweets after dinner to help aid in weight loss.     . Increase water intake     Recommend to increase water intake to 6-8 glasses a day.      Depression Screen PHQ 2/9 Scores 04/28/2020 03/19/2020 04/19/2019 04/17/2018 01/23/2018 01/21/2017 04/16/2016  PHQ - 2 Score 0 0 0 0 0 0 0  PHQ- 9 Score - 9 - - - - -  Exception Documentation - - - - - - -    Fall Risk Fall Risk  04/28/2020 03/19/2020 04/19/2019 12/18/2018 04/17/2018  Falls in the past year? 0 0 0 0 0  Number falls in past yr: 0 0 0 - -  Injury with Fall? 0 0 0 - -  Risk for fall due to : - - - - -  Risk for fall due to: Comment - - - - -  Follow up - Falls evaluation completed - - -    Any stairs in or around the home? No  If so, are there any without handrails? No  Home free of loose throw rugs in walkways, pet beds, electrical cords, etc? Yes  Adequate lighting in your home to reduce risk of falls? Yes   ASSISTIVE DEVICES UTILIZED TO PREVENT FALLS:  Life alert? No  Use of a cane, walker or w/c? No  Grab bars in the bathroom? No  Shower chair or bench in shower? No  Elevated toilet seat or a handicapped toilet? No    Cognitive Function: Declined today.     6CIT Screen 04/17/2018 04/16/2016  What Year? 0 points 0 points  What month? 0 points 0 points  What time? 0 points 0 points  Count back from 20 0 points 0 points    Months in reverse 4 points 4 points  Repeat phrase 2 points 10 points  Total Score 6 14    Immunizations Immunization History  Administered Date(s) Administered  . Fluad Quad(high Dose 65+) 03/01/2019, 03/19/2020  . Influenza, High Dose Seasonal PF 03/18/2015, 03/02/2016, 02/13/2018  . Influenza-Unspecified 03/14/2017  . Moderna SARS-COVID-2 Vaccination 07/28/2019, 08/25/2019  . Pneumococcal Conjugate-13 12/25/2013  . Pneumococcal Polysaccharide-23 03/04/2009  . Td 10/10/2003, 04/16/2016    TDAP status: Up to date Flu Vaccine status: Up to date Pneumococcal vaccine status: Up to date Covid-19 vaccine status: Completed vaccines  Qualifies for Shingles Vaccine? Yes   Zostavax completed No   Shingrix Completed?: No.    Education has been provided regarding the importance of this vaccine. Patient has been advised to call insurance company to determine out of pocket expense if they have not yet received this vaccine. Advised may also receive vaccine at local pharmacy or Health Dept. Verbalized acceptance and understanding.  Screening  Tests Health Maintenance  Topic Date Due  . FOOT EXAM  01/11/2018  . URINE MICROALBUMIN  04/29/2020  . COLONOSCOPY  04/28/2021 (Originally 11/19/2019)  . OPHTHALMOLOGY EXAM  06/14/2020  . HEMOGLOBIN A1C  09/18/2020  . TETANUS/TDAP  04/16/2026  . INFLUENZA VACCINE  Completed  . COVID-19 Vaccine  Completed  . Hepatitis C Screening  Completed  . PNA vac Low Risk Adult  Completed    Health Maintenance  Health Maintenance Due  Topic Date Due  . FOOT EXAM  01/11/2018  . URINE MICROALBUMIN  04/29/2020    Colorectal cancer screening: No longer required.   Lung Cancer Screening: (Low Dose CT Chest recommended if Age 6-80 years, 30 pack-year currently smoking OR have quit w/in 15years.) does not qualify.   Additional Screening:  Hepatitis C Screening: Up to date  Vision Screening: Recommended annual ophthalmology exams for early detection of  glaucoma and other disorders of the eye. Is the patient up to date with their annual eye exam?  Yes  Who is the provider or what is the name of the office in which the patient attends annual eye exams? Dr Edison Pace @ [facility] If pt is not established with a provider, would they like to be referred to a provider to establish care? No .   Dental Screening: Recommended annual dental exams for proper oral hygiene  Community Resource Referral / Chronic Care Management: CRR required this visit?  No   CCM required this visit?  No      Plan:     I have personally reviewed and noted the following in the patient's chart:   . Medical and social history . Use of alcohol, tobacco or illicit drugs  . Current medications and supplements . Functional ability and status . Nutritional status . Physical activity . Advanced directives . List of other physicians . Hospitalizations, surgeries, and ER visits in previous 12 months . Vitals . Screenings to include cognitive, depression, and falls . Referrals and appointments  In addition, I have reviewed and discussed with patient certain preventive protocols, quality metrics, and best practice recommendations. A written personalized care plan for preventive services as well as general preventive health recommendations were provided to patient.     Varie Machamer [place], [place]   [id number]   Nurse Notes: Pt needs a diabetic foot exam and urine check at today's in office apt.

## 2020-04-28 ENCOUNTER — Ambulatory Visit (INDEPENDENT_AMBULATORY_CARE_PROVIDER_SITE_OTHER): Payer: Medicare Other

## 2020-04-28 ENCOUNTER — Ambulatory Visit
Admission: RE | Admit: 2020-04-28 | Discharge: 2020-04-28 | Disposition: A | Discharge status: Home / Self Care | Payer: Medicare Other | Source: Ambulatory Visit | Attending: Family Medicine | Admitting: Family Medicine

## 2020-04-28 ENCOUNTER — Encounter: Payer: Self-pay | Admitting: Family Medicine

## 2020-04-28 ENCOUNTER — Ambulatory Visit (INDEPENDENT_AMBULATORY_CARE_PROVIDER_SITE_OTHER): Payer: Medicare Other | Admitting: Family Medicine

## 2020-04-28 ENCOUNTER — Ambulatory Visit
Admission: RE | Admit: 2020-04-28 | Discharge: 2020-04-28 | Disposition: A | Discharge status: Home / Self Care | Payer: Medicare Other | Attending: Family Medicine | Admitting: Family Medicine

## 2020-04-28 ENCOUNTER — Other Ambulatory Visit: Payer: Self-pay

## 2020-04-28 VITALS — BP 172/68 | HR 61 | Temp 98.4°F | Resp 18 | Ht 69.0 in | Wt 255.0 lb

## 2020-04-28 DIAGNOSIS — I509 Heart failure, unspecified: Secondary | ICD-10-CM | POA: Diagnosis not present

## 2020-04-28 DIAGNOSIS — R0609 Other forms of dyspnea: Secondary | ICD-10-CM

## 2020-04-28 DIAGNOSIS — J9811 Atelectasis: Secondary | ICD-10-CM | POA: Diagnosis not present

## 2020-04-28 DIAGNOSIS — Z0000 Encounter for general adult medical examination without abnormal findings: Secondary | ICD-10-CM | POA: Diagnosis not present

## 2020-04-28 DIAGNOSIS — R0600 Dyspnea, unspecified: Secondary | ICD-10-CM | POA: Diagnosis not present

## 2020-04-28 NOTE — Patient Instructions (Signed)
Ronald [name] , Thank you for taking time to come for your Medicare Wellness Visit. I appreciate your ongoing commitment to your health goals. Please review the following plan we discussed and let me know if I can assist you in the future.   Screening recommendations/referrals: Colonoscopy: No longer required per GI. Recommended yearly ophthalmology/optometry visit for glaucoma screening and checkup Recommended yearly dental visit for hygiene and checkup  Vaccinations: Influenza vaccine: Done 03/19/20 Pneumococcal vaccine: Completed series Tdap vaccine: Up to date, due 03/2026 Shingles vaccine: Shingrix discussed. Please contact your pharmacy for coverage information.     Advanced directives: Advance directive discussed with you today. Even though you declined this today please call our office should you change your mind and we can give you the proper paperwork for you to fill out.  Conditions/risks identified: Recommend to continue to cut out snacking after dinner and avoiding desserts. Also recommend continue to increase water intake to 6-8 8 oz glasses a day.  Next appointment: 2:00 PM today with Dr [name]   Preventive Care 65 Years and Older, Male Preventive care refers to lifestyle choices and visits with your health care provider that can promote health and wellness. What does preventive care include?  A yearly physical exam. This is also called an annual well check.  Dental exams once or twice a year.  Routine eye exams. Ask your health care provider how often you should have your eyes checked.  Personal lifestyle choices, including:  Daily care of your teeth and gums.  Regular physical activity.  Eating a healthy diet.  Avoiding tobacco and drug use.  Limiting alcohol use.  Practicing safe sex.  Taking low doses of aspirin every day.  Taking vitamin and mineral supplements as recommended by your health care provider. What happens during an annual well check? The  services and screenings done by your health care provider during your annual well check will depend on your age, overall health, lifestyle risk factors, and family history of disease. Counseling  Your health care provider may ask you questions about your:  Alcohol use.  Tobacco use.  Drug use.  Emotional well-being.  Home and relationship well-being.  Sexual activity.  Eating habits.  History of falls.  Memory and ability to understand (cognition).  Work and work Statistician. Screening  You may have the following tests or measurements:  Height, weight, and BMI.  Blood pressure.  Lipid and cholesterol levels. These may be checked every 5 years, or more frequently if you are over 65 years old.  Skin check.  Lung cancer screening. You may have this screening every year starting at age 33 if you have a 30-pack-year history of smoking and currently smoke or have quit within the past 15 years.  Fecal occult blood test (FOBT) of the stool. You may have this test every year starting at age 81.  Flexible sigmoidoscopy or colonoscopy. You may have a sigmoidoscopy every 5 years or a colonoscopy every 10 years starting at age 52.  Prostate cancer screening. Recommendations will vary depending on your family history and other risks.  Hepatitis C blood test.  Hepatitis B blood test.  Sexually transmitted disease (STD) testing.  Diabetes screening. This is done by checking your blood sugar (glucose) after you have not eaten for a while (fasting). You may have this done every 1-3 years.  Abdominal aortic aneurysm (AAA) screening. You may need this if you are a current or former smoker.  Osteoporosis. You may be screened starting at age  70 if you are at high risk. Talk with your health care provider about your test results, treatment options, and if necessary, the need for more tests. Vaccines  Your health care provider may recommend certain vaccines, such as:  Influenza  vaccine. This is recommended every year.  Tetanus, diphtheria, and acellular pertussis (Tdap, Td) vaccine. You may need a Td booster every 10 years.  Zoster vaccine. You may need this after age 10.  Pneumococcal 13-valent conjugate (PCV13) vaccine. One dose is recommended after age 69.  Pneumococcal polysaccharide (PPSV23) vaccine. One dose is recommended after age 22. Talk to your health care provider about which screenings and vaccines you need and how often you need them. This information is not intended to replace advice given to you by your health care provider. Make sure you discuss any questions you have with your health care provider. Document Released: 06/13/2015 Document Revised: 02/04/2016 Document Reviewed: 03/18/2015 Elsevier Interactive Patient Education  2017 [place] Prevention in the Home Falls can cause injuries. They can happen to people of all ages. There are many things you can do to make your home safe and to help prevent falls. What can I do on the outside of my home?  Regularly fix the edges of walkways and driveways and fix any cracks.  Remove anything that might make you trip as you walk through a door, such as a raised step or threshold.  Trim any bushes or trees on the path to your home.  Use bright outdoor lighting.  Clear any walking paths of anything that might make someone trip, such as rocks or tools.  Regularly check to see if handrails are loose or broken. Make sure that both sides of any steps have handrails.  Any raised decks and porches should have guardrails on the edges.  Have any leaves, snow, or ice cleared regularly.  Use sand or salt on walking paths during winter.  Clean up any spills in your garage right away. This includes oil or grease spills. What can I do in the bathroom?  Use night lights.  Install grab bars by the toilet and in the tub and shower. Do not use towel bars as grab bars.  Use non-skid mats or decals  in the tub or shower.  If you need to sit down in the shower, use a plastic, non-slip stool.  Keep the floor dry. Clean up any water that spills on the floor as soon as it happens.  Remove soap buildup in the tub or shower regularly.  Attach bath mats securely with double-sided non-slip rug tape.  Do not have throw rugs and other things on the floor that can make you trip. What can I do in the bedroom?  Use night lights.  Make sure that you have a light by your bed that is easy to reach.  Do not use any sheets or blankets that are too big for your bed. They should not hang down onto the floor.  Have a firm chair that has side arms. You can use this for support while you get dressed.  Do not have throw rugs and other things on the floor that can make you trip. What can I do in the kitchen?  Clean up any spills right away.  Avoid walking on wet floors.  Keep items that you use a lot in easy-to-reach places.  If you need to reach something above you, use a strong step stool that has a grab bar.  Keep  electrical cords out of the way.  Do not use floor polish or wax that makes floors slippery. If you must use wax, use non-skid floor wax.  Do not have throw rugs and other things on the floor that can make you trip. What can I do with my stairs?  Do not leave any items on the stairs.  Make sure that there are handrails on both sides of the stairs and use them. Fix handrails that are broken or loose. Make sure that handrails are as long as the stairways.  Check any carpeting to make sure that it is firmly attached to the stairs. Fix any carpet that is loose or worn.  Avoid having throw rugs at the top or bottom of the stairs. If you do have throw rugs, attach them to the floor with carpet tape.  Make sure that you have a light switch at the top of the stairs and the bottom of the stairs. If you do not have them, ask someone to add them for you. What else can I do to help  prevent falls?  Wear shoes that:  Do not have high heels.  Have rubber bottoms.  Are comfortable and fit you well.  Are closed at the toe. Do not wear sandals.  If you use a stepladder:  Make sure that it is fully opened. Do not climb a closed stepladder.  Make sure that both sides of the stepladder are locked into place.  Ask someone to hold it for you, if possible.  Clearly mark and make sure that you can see:  Any grab bars or handrails.  First and last steps.  Where the edge of each step is.  Use tools that help you move around (mobility aids) if they are needed. These include:  Canes.  Walkers.  Scooters.  Crutches.  Turn on the lights when you go into a dark area. Replace any light bulbs as soon as they burn out.  Set up your furniture so you have a clear path. Avoid moving your furniture around.  If any of your floors are uneven, fix them.  If there are any pets around you, be aware of where they are.  Review your medicines with your doctor. Some medicines can make you feel dizzy. This can increase your chance of falling. Ask your doctor what other things that you can do to help prevent falls. This information is not intended to replace advice given to you by your health care provider. Make sure you discuss any questions you have with your health care provider. Document Released: 03/13/2009 Document Revised: 10/23/2015 Document Reviewed: 06/21/2014 Elsevier Interactive Patient Education  2017 [employer].

## 2020-04-29 ENCOUNTER — Encounter: Payer: Self-pay | Admitting: Family Medicine

## 2020-05-04 ENCOUNTER — Other Ambulatory Visit: Payer: Self-pay | Admitting: Family Medicine

## 2020-05-04 NOTE — Telephone Encounter (Signed)
Requested Prescriptions  Pending Prescriptions Disp Refills  . magnesium oxide (MAG-OX) 400 MG tablet [Pharmacy Med Name: MAGNESIUM OXIDE 400 MG TABLET] 180 tablet     Sig: TAKE 1 TABLET BY MOUTH TWICE A DAY     Endocrinology:  Minerals - Magnesium Supplementation Failed - 05/04/2020  6:37 PM      Failed - Mg Level in normal range and within 360 days    Magnesium  Date Value Ref Range Status  06/12/2018 2.6 (H) 1.7 - 2.4 mg/dL Final    Comment:    Performed at [facility], [address] [place], [place] [address]         Passed - Valid encounter within last 12 months    Recent Outpatient Visits          6 days ago Annual physical exam   [facility] [name]., [facility]   1 month ago Hyperglycemia   [facility] [name]., MD   4 months ago Essential hypertension   [facility] [name]., MD   6 months ago Hypothyroidism, unspecified type   [facility] [name]., MD   6 months ago CKD (chronic kidney disease), stage IV [facility])   [facility] [name]., MD      Future Appointments            In 1 month [name]., MD [facility], [place]   In 2 months [name]., MD [facility], [place]   In 2 months Angelena Form, Annita Brod, MD [facility], [facility]           . amLODipine (NORVASC) 10 MG tablet [Pharmacy Med Name: AMLODIPINE BESYLATE 10 MG TAB] 90 tablet     Sig: TAKE 1 TABLET BY MOUTH EVERY DAY     Cardiovascular:  Calcium Channel Blockers Failed - 05/04/2020  6:37 PM      Failed - Last BP in normal range    BP Readings from Last 1 Encounters:  04/28/20 (!) 172/68         Passed - Valid encounter within last 6 months    Recent Outpatient Visits          6 days ago Annual physical exam   [facility] [name]., MD   1 month ago  Hyperglycemia   [facility] [name]., MD   4 months ago Essential hypertension   [facility] [name]., MD   6 months ago Hypothyroidism, unspecified type   [facility] [name]., MD   6 months ago CKD (chronic kidney disease), stage IV [facility])   [facility] [name]., MD      Future Appointments            In 1 month [name]., MD [facility], [place]   In 2 months [name]., MD [facility], [place]   In 2 months Angelena Form, Annita Brod, MD [place] [facility], [facility]

## 2020-05-04 NOTE — Telephone Encounter (Signed)
Requested medication (s) are due for refill today: yes  Requested medication (s) are on the active medication list: yes  Last refill:  04/01/19  Future visit scheduled: yes  Notes to clinic:  overdue lab work   Requested Prescriptions  Pending Prescriptions Disp Refills   magnesium oxide (MAG-OX) 400 MG tablet [Pharmacy Med Name: MAGNESIUM OXIDE 400 MG TABLET] 180 tablet     Sig: TAKE 1 [place] A DAY      Endocrinology:  Minerals - Magnesium Supplementation Failed - 05/04/2020  6:37 PM      Failed - Mg Level in normal range and within 360 days    Magnesium  Date Value Ref Range Status  06/12/2018 2.6 (H) 1.7 - 2.4 mg/dL Final    Comment:    Performed at [facility], [address], [place], [place] [address]          Passed - Valid encounter within last 12 months    Recent Outpatient Visits           6 days ago Annual physical exam   [facility] [name]., MD   1 month ago Hyperglycemia   [facility] [name]., MD   4 months ago Essential hypertension   [facility] [name]., MD   6 months ago Hypothyroidism, unspecified type   [facility] [name]., MD   6 months ago CKD (chronic kidney disease), stage IV [facility])   [name]       Future Appointments             In 1 month [name]., [facility], [place]   In 2 months [name]., MD [facility], [place]   In 2 months Angelena Form, Annita Brod, MD [facility], [facility]             Refused Prescriptions Disp Refills   amLODipine ([place]) 10 MG tablet [Pharmacy Med Name: AMLODIPINE BESYLATE 10 MG TAB] 90 tablet     Sig: TAKE 1 TABLET BY MOUTH EVERY DAY      Cardiovascular:  Calcium Channel Blockers Failed - 05/04/2020  6:37 PM      Failed - Last BP in normal  range    BP Readings from Last 1 Encounters:  04/28/20 (!) 172/68          Passed - Valid encounter within last 6 months    Recent Outpatient Visits           6 days ago Annual physical exam   [facility] [name]., MD   1 month ago Hyperglycemia   [facility] [name]., MD   4 months ago Essential hypertension   [facility] [name]., MD   6 months ago Hypothyroidism, unspecified type   [facility] [name]., MD   6 months ago CKD (chronic kidney disease), stage IV [facility])   [facility] [name]., MD       Future Appointments             In 1 month [name]., MD [facility], [place]   In 2 months [name]., MD [facility], [place]   In 2 months Angelena Form, Annita Brod, MD [place]  Office, [facility]

## 2020-05-08 ENCOUNTER — Other Ambulatory Visit: Payer: Self-pay | Admitting: *Deleted

## 2020-05-08 DIAGNOSIS — R0609 Other forms of dyspnea: Secondary | ICD-10-CM

## 2020-05-08 DIAGNOSIS — I509 Heart failure, unspecified: Secondary | ICD-10-CM

## 2020-05-08 DIAGNOSIS — R0600 Dyspnea, unspecified: Secondary | ICD-10-CM

## 2020-05-11 ENCOUNTER — Other Ambulatory Visit: Payer: Self-pay | Admitting: Family Medicine

## 2020-05-20 ENCOUNTER — Telehealth: Payer: Self-pay

## 2020-05-20 DIAGNOSIS — N184 Chronic kidney disease, stage 4 (severe): Secondary | ICD-10-CM | POA: Diagnosis not present

## 2020-05-20 NOTE — Telephone Encounter (Signed)
**Note De-Identified  Obfuscation** The pt left his completed BMSPAF application for Eliquis at the office with required documents. I have completed the provider page of the application and emailed all to Dr Alyse Low nurse so she can obtain his signature, date it and to fax all to [facility] at fax number written on the cover letter.

## 2020-05-26 DIAGNOSIS — I701 Atherosclerosis of renal artery: Secondary | ICD-10-CM | POA: Diagnosis not present

## 2020-05-26 DIAGNOSIS — N2581 Secondary hyperparathyroidism of renal origin: Secondary | ICD-10-CM | POA: Diagnosis not present

## 2020-05-26 DIAGNOSIS — R600 Localized edema: Secondary | ICD-10-CM | POA: Diagnosis not present

## 2020-05-26 DIAGNOSIS — I10 Essential (primary) hypertension: Secondary | ICD-10-CM | POA: Diagnosis not present

## 2020-05-26 DIAGNOSIS — N184 Chronic kidney disease, stage 4 (severe): Secondary | ICD-10-CM | POA: Diagnosis not present

## 2020-05-29 NOTE — Telephone Encounter (Signed)
Application has been signed and faxed.  

## 2020-06-02 ENCOUNTER — Ambulatory Visit: Payer: Medicare Other | Admitting: Cardiovascular Disease

## 2020-06-02 NOTE — Progress Notes (Deleted)
No chief complaint on file.  History of Present Illness: 75 yo male with history of CAD s/p CABG in 1996 (LIMA-LAD, SVG-circumflex, SVG-RCA, SVG-diagonal) with redo bypass in 2007 (SVG-OM, SVG-RCA), ischemic cardiomyopathy, CKD, DM 2, HTN, HLD, renal artery stenosis, COPD, GERD, atrial fibrillation, AAA and PVCs here today for cardiac follow up. Echo in 2014 with normal LV systolic function. He was hospitalized at [facility] July 2016 with a NSTEMI. Cardiac cath with severe disease in the SVG to OM treated with a drug eluting stent. He was treated with Brilinta post PCI but developed dyspnea and was changed to Plavix. He was admitted to [facility] May 2017 with unstable angina and cardiac cath showed severe disease in the distal body/anastomosis of the SVG to Diagonal.  A drug eluting stent was placed in this vein graft. The LIMA to LAD was patent, SVG to OM was patent, SVG to PDA/PLA was patent. He was admitted to [facility] February 2018 with chest pain. Troponin was negative x 3. He did not have an ischemic evaluation. Echo February 2018 with  mild LV systolic dysfunction. LVEF=45-50%. I saw him in the office in January 2019 and he was volume overloaded. Lasix was started. He was admitted to [facility] February 2019 with unstable angina and cardiac cath showed patency of all of the bypass grafts. Echo February 2019 with normal LV systolic function, KCLE=75-17%, grade 1 diastolic dysfunction. He was admitted to [facility] January 2020 with atrial fib/flutter with RVR. Started on Xarelto and initially rate controlled with IV Cardizem. He was loaded with IV amiodarone and discharged home on po amiodarone and Lopressor (Coreg stopped during admission). His Plavix was stopped and Norvasc were stopped at discharge. His Norvasc was restarted in primary care. Echo January 2020 with LVEF=30-35%. Mild MR. He has a AAA, last u/s March 2021 with 3.1 cm aneurysm.   He is here today for follow up. The patient denies any chest pain, palpitations,  lower extremity edema, orthopnea, PND, dizziness, near syncope or syncope. *** No change in chronic dyspnea.    Primary Care Physician: [name]., MD  Past Medical History:  Diagnosis Date  . AAA (abdominal aortic aneurysm) ([place])    a. 3cm by [place] 2015.  [name] Arthritis    "hips; back" (12/13/2014)  . CAD (coronary artery disease) 2007   a. s/p CABG- IMA-LAD, VG-Cx, VG-RCA, VG-diag in 1999. B. sp redo CABG- VG-OM, VG-RCA in 2007 due to VG disease. c. NSTEMI 11/2014 s/p DES to SVG-OM from the Y graft.d. PTCA/DES x 1 distal body of SVG to Diagonal.09/2015  . Chronic combined systolic and diastolic CHF (congestive heart failure) ([place])    a. remote EF 40-45% in 2006. b. Normal EF 2014. b. Echo 07/2016 EF 45-50%, grade 1 DD.  [name] Chronic lower back pain   . CKD (chronic kidney disease), stage IV ([place])   . COPD (chronic obstructive pulmonary disease) ([place])   . Deafness in left ear   . Degenerative disc disease, lumbar   . Dilated cardiomyopathy ([place]) 10/07/2015  . Emphysema   . Esophageal stricture 07/02/1998   EGD  . GERD (gastroesophageal reflux disease)   . History of gout    "last flareup was in 2007" (12/13/2014)  . History of hiatal hernia   . Hyperlipidemia   . Hypertension   . Ischemic cardiomyopathy 2006   EF 40% to 50% by 2D echo in 2006;  Echo 12/31/12: Mild LVH, EF 50-55%, normal wall motion.   [name] PVC's (premature ventricular  contractions)   . Renal artery stenosis ([place])    a. noted on CT 2008.  . Type II diabetes mellitus ([place])    Diet control   . Walking pneumonia 1990's    Past Surgical History:  Procedure Laterality Date  . CARDIAC CATHETERIZATION  "several"  . CARDIAC CATHETERIZATION N/A 12/13/2014   Procedure: Left Heart Cath and Coronary Angiography;  Surgeon: Jettie Booze, MD;  Location: [place] [facility];  Service: Cardiovascular;  Laterality: N/A;  . CARDIAC CATHETERIZATION  12/13/2014   Procedure: Coronary Stent Intervention;  Surgeon: Jettie Booze,  MD;  Location: [place] [facility];  Service: Cardiovascular;;  . CARDIAC CATHETERIZATION N/A 10/07/2015   Procedure: Left Heart Cath and Cors/Grafts Angiography;  Surgeon: Burnell Blanks, MD;  Location: [place] CV [facility];  Service: Cardiovascular;  Laterality: N/A;  . CARDIAC CATHETERIZATION N/A 10/07/2015   Procedure: Coronary Stent Intervention;  Surgeon: Burnell Blanks, MD;  Location: [place] CV [facility];  Service: Cardiovascular;  Laterality: N/A;  . CORONARY ANGIOPLASTY  "several"  . CORONARY ANGIOPLASTY WITH STENT PLACEMENT  2005; 12/13/2014   "2; 1"  . CORONARY ARTERY BYPASS GRAFT  1996   CABG X5  . CORONARY ARTERY BYPASS GRAFT  March 2007   CABG X3  . ESOPHAGOGASTRODUODENOSCOPY (EGD) WITH ESOPHAGEAL DILATION  2000  . GREEN LIGHT LASER TURP (TRANSURETHRAL RESECTION OF PROSTATE  2000's   "not cancerous"  . HERNIA REPAIR    . LAPAROSCOPIC CHOLECYSTECTOMY    . LEFT HEART CATH AND CORS/GRAFTS ANGIOGRAPHY N/A 07/28/2017   Procedure: LEFT HEART CATH AND CORS/GRAFTS ANGIOGRAPHY;  Surgeon: Troy Sine, MD;  Location: [place] CV LAB;  Service: Cardiovascular;  Laterality: N/A;  . LUNG SURGERY  1996   "S/P CABG, had to put staple in lung after it had collapsed"  . UMBILICAL HERNIA REPAIR     w/chole    Current Outpatient Medications  Medication Sig Dispense Refill  . allopurinol (ZYLOPRIM) 100 MG tablet TAKE 1 TABLET BY MOUTH AT  BEDTIME 90 tablet 3  . amiodarone (PACERONE) 200 MG tablet TAKE 1 TABLET BY MOUTH  DAILY 90 tablet 2  . amLODipine (NORVASC) 10 MG tablet TAKE 1 TABLET BY MOUTH EVERY DAY 90 tablet 1  . apixaban (ELIQUIS) 5 MG TABS tablet Take 1 tablet (5 mg total) by mouth 2 (two) times daily. 180 tablet 3  . aspirin EC 81 MG tablet Take 1 tablet (81 mg total) by mouth daily. 90 tablet 3  . atorvastatin (LIPITOR) 40 MG tablet TAKE 1 TABLET BY MOUTH  DAILY 90 tablet 2  . Cholecalciferol (VITAMIN D-3) 125 MCG (5000 UT) TABS Take 2,000 Units by mouth daily.      . furosemide (LASIX) 40 MG tablet Take 40 mg by mouth daily as needed for fluid or edema.   6  . isosorbide mononitrate (IMDUR) 30 MG 24 hr tablet TAKE 1 TABLET BY MOUTH  DAILY 90 tablet 3  . levothyroxine (SYNTHROID) 75 MCG tablet Take 1 tablet (75 mcg total) by mouth daily before breakfast. 90 tablet 3  . loratadine (CLARITIN) 10 MG tablet Take 10 mg by mouth daily as needed for allergies.    . magnesium oxide (MAG-OX) 400 MG tablet TAKE 1 TABLET BY MOUTH TWICE A DAY 180 tablet 1  . Melatonin 5 MG TABS Take 1 tablet by mouth at bedtime.    . metoprolol tartrate (LOPRESSOR) 100 MG tablet TAKE 1 TABLET BY MOUTH  TWICE DAILY 180 tablet 1  .  Multiple Vitamin (MULTIVITAMIN PO) Take 1 tablet by mouth daily.    . nitroGLYCERIN (NITROSTAT) 0.4 MG SL tablet Place 0.4 mg under the tongue every 5 (five) minutes as needed for chest pain (Up to 3 times).     . nystatin (NYSTATIN) powder Apply 1 application topically 3 (three) times daily. 15 g 0  . pantoprazole (PROTONIX) 40 MG tablet TAKE 2 TABLETS BY MOUTH  DAILY 180 tablet 3  . polyethylene glycol (MIRALAX / GLYCOLAX) 17 g packet Take 17 g by mouth daily.    . sodium bicarbonate 650 MG tablet Take 650 mg by mouth 2 (two) times daily. Evening     No current facility-administered medications for this visit.    Allergies  Allergen Reactions  . Predicort [Prednisolone] Other (See Comments)    Stomach pain  . Ciprofloxacin     GI upset  . Hydrochlorothiazide Other (See Comments)    Dehydration  . Hydrocodone     Stomach upset  . Hydrocodone-Acetaminophen     Stomach upset  . Hydrocodone-Acetaminophen Nausea Only  . Loratadine Other (See Comments)    Other reaction(s): Unknown  . Sulfa Antibiotics Other (See Comments)    Cannot recall  . Sulfacetamide Sodium Other (See Comments)    Cannot recall  . Sulfasalazine     Other reaction(s): Other (See Comments) Cannot recall  . Hydrocodone-Acetaminophen Nausea Only  . Penicillins Hives and Rash     Has patient had a PCN reaction causing immediate rash, facial/tongue/throat swelling, SOB or lightheadedness with hypotension: YES Has patient had a PCN reaction causing severe rash involving mucus membranes or skin necrosis: NO Has patient had a PCN reaction that required hospitalization NO Has patient had a PCN reaction occurring within the last 10 years:NO If all of the above answers are "NO", then may proceed with Cephalosporin use.    Social History   Socioeconomic History  . Marital status: Married    Spouse name: Not on file  . Number of children: 2  . Years of education: Not on file  . Highest education level: 8th grade  Occupational History  . Occupation: Retired  Tobacco Use  . Smoking status: Former Smoker    Packs/day: 3.00    Years: 20.00    Pack years: 60.00    Types: Cigarettes    Quit date: 07/25/1986    Years since quitting: 33.8  . Smokeless tobacco: Never Used  Vaping Use  . Vaping Use: Never used  Substance and Sexual Activity  . Alcohol use: No    Alcohol/week: 0.0 standard drinks  . Drug use: No  . Sexual activity: Not Currently  Other Topics Concern  . Not on file  Social History Narrative   Did auto salvage work.   Lives at home with his wife.  Independent at baseline.   Social Determinants of Health   Financial Resource Strain: Low Risk   . Difficulty of Paying Living Expenses: Not hard at all  Food Insecurity: No Food Insecurity  . Worried About Charity fundraiser in the Last Year: Never true  . Ran Out of Food in the Last Year: Never true  Transportation Needs: No Transportation Needs  . Lack of Transportation (Medical): No  . Lack of Transportation (Non-Medical): No  Physical Activity: Inactive  . Days of Exercise per Week: 0 days  . Minutes of Exercise per Session: 0 min  Stress: No Stress Concern Present  . Feeling of Stress : Not at all  Social Connections:  Moderately Isolated  . Frequency of Communication with Friends and  Family: Once a week  . Frequency of Social Gatherings with Friends and Family: More than three times a week  . Attends Religious Services: Never  . Active Member of Clubs or Organizations: No  . Attends Archivist Meetings: Never  . Marital Status: Married  Human resources officer Violence: Not At Risk  . Fear of Current or Ex-Partner: No  . Emotionally Abused: No  . Physically Abused: No  . Sexually Abused: No    Family History  Problem Relation Age of Onset  . Heart attack Mother        MI  . Stroke Mother   . Heart disease Mother   . Hypertension Mother   . Hyperlipidemia Mother   . Heart disease Father   . Rheumatic fever Father   . Colon cancer Neg Hx     Review of Systems:  As stated in the HPI and otherwise negative.   There were no vitals taken for this visit.  Physical Examination:  General: Well developed, well nourished, NAD  HEENT: OP clear, mucus membranes moist  SKIN: warm, dry. No rashes. Neuro: No focal deficits  Musculoskeletal: Muscle strength 5/5 all ext  Psychiatric: Mood and affect normal  Neck: No JVD, no carotid bruits, no thyromegaly, no lymphadenopathy.  Lungs:Clear bilaterally, no wheezes, rhonci, crackles Cardiovascular: Regular rate and rhythm. No murmurs, gallops or rubs. Abdomen:Soft. Bowel sounds present. Non-tender.  Extremities: No lower extremity edema. Pulses are 2 + in the bilateral DP/PT.  Echo 07/14/17: - Left ventricle: The cavity size was mildly dilated. Systolic   function was normal. The estimated ejection fraction was in the   range of 55% to 60%. Doppler parameters are consistent with   abnormal left ventricular relaxation (grade 1 diastolic   dysfunction).  Echo January 2020: Left ventricle: The cavity size was moderately dilated. Systolic  function was moderately to severely reduced. The estimated  ejection fraction was in the range of 30% to 35%. Diffuse  hypokinesis. The study is not technically sufficient  to allow  evaluation of LV diastolic function.  - Mitral valve: There was mild regurgitation.  - Left atrium: The atrium was mildly dilated.  - Right ventricle: The cavity size was mildly dilated. Wall  thickness was normal. Systolic function was mildly reduced  Cardiac cath February 2019: Significant native CAD with total occlusion of the LAD after the takeoff of the first diagonal vessel; total occlusion of the proximal left circumflex coronary artery; and total occlusion of the proximal RCA with antegrade bridging collaterals. Patent LIMA to LAD. Patent Y vein graft from the 1996 surgery which supplies the diagonal vessel and distal circumflex marginal vessel.  There is diffuse narrowing of 50% in the midportion of the Y graft supplying the diagonal vessel and a patent distal stent extending to the ostium of the diagonal.  The Y graft supplying the distal marginal is free of significant disease and has mild luminal irregularity. Patent SVG supplying the distal RCA from the 2007 surgery with a stent in the proximal portion of the graft with 30% narrowings in the proximal and mid segment. LVEDP 18 mm  Diagnostic Diagram        EKG:  EKG is *** ordered today. The ekg ordered today demonstrates   Recent Labs: 10/10/2019: NT-Pro BNP 1,020 03/20/2020: ALT 24; BUN 29; Creatinine, Ser 2.46; Hemoglobin 11.9; Platelets 128; Potassium 4.8; Sodium 145; TSH 5.330   Lipid Panel  Component Value Date/Time   CHOL 147 03/20/2020 1038   TRIG 80 03/20/2020 1038   HDL 52 03/20/2020 1038   CHOLHDL 2.8 03/20/2020 1038   CHOLHDL 4.5 12/13/2014 0423   VLDL 25 12/13/2014 0423   LDLCALC 80 03/20/2020 1038     Wt Readings from Last 3 Encounters:  04/28/20 255 lb (115.7 kg)  03/19/20 259 lb (117.5 kg)  01/25/20 251 lb 6.4 oz (114 kg)     Other studies Reviewed: Additional studies/ records that were reviewed today include: . Review of the above records demonstrates:    Assessment and  Plan:   1. CAD s/p CABG without angina: Cardiac cath February 2019 with 4 patent vein grafts. He has no chest pain. Will continue statin, Imdur and beta blocker. He has stopped ASA due to easy bruising       2. Ischemic Cardiomyopathy: LVEF =35% by echo at [facility] during his admission January 2020 with atrial fib. No Ace-inh or ARB due to CKD. Will continue beta blocker  3. Hypertension: BP is controlled. No changes in therapy  4. Hyperlipidemia: Lipids followed in primary care. LDL ***. Continue statin  5. CKD: Followed by Nephrology. Ace-inh stopped due to renal insufficiency.   6. Chronic systolic CHF: Weight is stable. No volume overload on exam. Lasix prn.     7. PVCs/Bigeminy: No palpitations. Continue beta blocker.    8. AAA: Slight increase from 2.8 cm to 3.1 cm by u/s march 2021. *** Repeat u/s march 2022.    9. Atrial fibrillation, paroxysmal: sinus today. Will continue beta blocker, amiodarone and Xarelto. *** He wishes to change to Eliquis but has 45 days of Xarelto left. CHADS VASC score 5. Recent Chest x-ray, LFTs and TSH.   10. Sleep apnea: he is on CPAP.  Current medicines are reviewed at length with the patient today.  The patient does not have concerns regarding medicines.  The following changes have been made:  no change  Labs/ tests ordered today include:   No orders of the defined types were placed in this encounter.   Disposition:   FU with me in 6 months.    Signed, Lauree Chandler, MD 06/02/2020 7:39 AM    [address], [place], [place]  [address] Phone: [phone]; Fax: [phone]

## 2020-06-08 ENCOUNTER — Other Ambulatory Visit: Payer: Self-pay | Admitting: Cardiovascular Disease

## 2020-06-20 ENCOUNTER — Telehealth: Payer: Self-pay

## 2020-06-26 NOTE — Telephone Encounter (Signed)
**Note De-Identified Ronald [name] Obfuscation** Letter received Ellaina Schuler fax from [facility] stating that they denied the pt for asst with his Eliquis at this time. Reason: Documentation of 3%OOP prescription expenses not met.  The letter states that they have notified the pt of this denial as well.

## 2020-06-30 ENCOUNTER — Ambulatory Visit (INDEPENDENT_AMBULATORY_CARE_PROVIDER_SITE_OTHER): Payer: Medicare Other | Admitting: Family Medicine

## 2020-06-30 ENCOUNTER — Other Ambulatory Visit: Payer: Self-pay

## 2020-06-30 VITALS — BP 135/65 | HR 52 | Temp 98.4°F | Resp 18 | Ht 69.0 in | Wt 253.0 lb

## 2020-06-30 DIAGNOSIS — N184 Chronic kidney disease, stage 4 (severe): Secondary | ICD-10-CM

## 2020-06-30 DIAGNOSIS — I714 Abdominal aortic aneurysm, without rupture, unspecified: Secondary | ICD-10-CM

## 2020-06-30 DIAGNOSIS — I420 Dilated cardiomyopathy: Secondary | ICD-10-CM

## 2020-06-30 DIAGNOSIS — I10 Essential (primary) hypertension: Secondary | ICD-10-CM

## 2020-06-30 DIAGNOSIS — K219 Gastro-esophageal reflux disease without esophagitis: Secondary | ICD-10-CM | POA: Diagnosis not present

## 2020-06-30 DIAGNOSIS — Z6834 Body mass index (BMI) 34.0-34.9, adult: Secondary | ICD-10-CM

## 2020-06-30 DIAGNOSIS — R739 Hyperglycemia, unspecified: Secondary | ICD-10-CM | POA: Diagnosis not present

## 2020-06-30 DIAGNOSIS — R432 Parageusia: Secondary | ICD-10-CM | POA: Diagnosis not present

## 2020-06-30 DIAGNOSIS — J309 Allergic rhinitis, unspecified: Secondary | ICD-10-CM | POA: Diagnosis not present

## 2020-06-30 DIAGNOSIS — E6609 Other obesity due to excess calories: Secondary | ICD-10-CM

## 2020-06-30 DIAGNOSIS — I480 Paroxysmal atrial fibrillation: Secondary | ICD-10-CM | POA: Diagnosis not present

## 2020-06-30 LAB — POCT GLYCOSYLATED HEMOGLOBIN (HGB A1C)
Est. average glucose Bld gHb Est-mCnc: 120
Hemoglobin A1C: 5.8 % — AB (ref 4.0–5.6)

## 2020-06-30 NOTE — Progress Notes (Signed)
I,[name],acting as a scribe for [name], MD.,have documented all relevant documentation on the behalf of [name], MD,as directed by  [name], MD while in the presence of [name], MD.   Established patient visit   Patient: Ronald [name]   DOB: 1946/05/01   75 y.o. Male  MRN: [id number] Visit Date: 06/30/2020  Today's healthcare provider: [name], MD   Chief Complaint  Patient presents with  . Follow-up  . Hypertension  . Prediabetes  . Congestive Heart Failure   Subjective    HPI  Patient's dyspnea on exertion that he was complained about on the last visit is a little bit better.  He is feeling pretty well. He has had all 3 Covid vaccines but about 2-3 months ago he states he lost a sense of taste.  No other symptoms and he states he was not sick at that time.  He is wonders if it could be his sinuses or reflux that is causing the symptoms.  He has been told that GI would not do endoscopy because of the risk of anesthesia is too much. Hypertension, follow-up  BP Readings from Last 3 Encounters:  06/30/20 135/65  04/28/20 (!) 172/68  03/19/20 (!) 158/63   Wt Readings from Last 3 Encounters:  06/30/20 253 lb (114.8 kg)  04/28/20 255 lb (115.7 kg)  03/19/20 259 lb (117.5 kg)     He was last seen for hypertension 3 months ago.  BP at that visit was 158/63. Management since that visit includes; on HCTZ. He reports good compliance with treatment. He is not having side effects. none He is not exercising. He is adherent to low salt diet.   Outside blood pressures are not checking.  He does not smoke.  Use of agents associated with hypertension: none.   --------------------------------------------------------------------  Prediabetes, Follow-up  Lab Results  Component Value Date   HGBA1C 5.8 (A) 06/30/2020   HGBA1C 5.9 (H) 03/20/2020   HGBA1C 6.0 (A) 12/17/2019   GLUCOSE 124 (H) 03/20/2020   GLUCOSE 122  (H) 10/10/2019   GLUCOSE 130 (H) 05/01/2019    Last seen for for this3 months ago.  Management since that visit includes; labs checked showing-clinically stable. advised to try to drink a little more water. Current symptoms include none and have been unchanged.  Prior visit with dietician: no Current diet: well balanced Current exercise: none  Pertinent Labs:    Component Value Date/Time   CHOL 147 03/20/2020 1038   TRIG 80 03/20/2020 1038   CHOLHDL 2.8 03/20/2020 1038   CHOLHDL 4.5 12/13/2014 0423   CREATININE 2.46 (H) 03/20/2020 1038   CREATININE 2.44 (H) 11/24/2015 1546    Wt Readings from Last 3 Encounters:  06/30/20 253 lb (114.8 kg)  04/28/20 255 lb (115.7 kg)  03/19/20 259 lb (117.5 kg)    --------------------------------------------------------------------  Dyspnea on exertion From 04/28/2020-I think he has significant truncal obesity which limits diaphragmatic excursion. With his heart disease he may have some underlying CHF. Obtain chest x-ray. May need referral back to cardiology.  May also need pulmonary evaluation. Chest x-ray showed-Normal.  Congestive heart failure, unspecified HF chronicity, unspecified heart failure type ([place]) From 04/28/2020-He has chronic heart disease and some orthopnea by description.  He sleeps in a recliner frequently. Patient also has significant chronic kidney disease.  He has no chest pain or chest tightness. Last EF was 30 to 35%.     Medications: Outpatient Medications Prior  to Visit  Medication Sig  . allopurinol (ZYLOPRIM) 100 MG tablet TAKE 1 TABLET BY MOUTH AT  BEDTIME  . amiodarone (PACERONE) 200 MG tablet TAKE 1 TABLET BY MOUTH  DAILY  . amLODipine (NORVASC) 10 MG tablet TAKE 1 TABLET BY MOUTH EVERY DAY  . apixaban (ELIQUIS) 5 MG TABS tablet Take 1 tablet (5 mg total) by mouth 2 (two) times daily.  [name] aspirin EC 81 MG tablet Take 1 tablet (81 mg total) by mouth daily.  [name] atorvastatin (LIPITOR) 40 MG tablet TAKE 1  TABLET BY MOUTH  DAILY  . Cholecalciferol (VITAMIN D-3) 125 MCG (5000 UT) TABS Take 2,000 Units by mouth daily.   . furosemide (LASIX) 40 MG tablet Take 40 mg by mouth daily as needed for fluid or edema.   . isosorbide mononitrate (IMDUR) 30 MG 24 hr tablet TAKE 1 TABLET BY MOUTH  DAILY  . levothyroxine (SYNTHROID) 75 MCG tablet Take 1 tablet (75 mcg total) by mouth daily before breakfast.  . loratadine (CLARITIN) 10 MG tablet Take 10 mg by mouth daily as needed for allergies.  . magnesium oxide (MAG-OX) 400 MG tablet TAKE 1 TABLET BY MOUTH TWICE A DAY  . Melatonin 5 MG TABS Take 1 tablet by mouth at bedtime.  . metoprolol tartrate (LOPRESSOR) 100 MG tablet TAKE 1 TABLET BY MOUTH  TWICE DAILY  . Multiple Vitamin (MULTIVITAMIN PO) Take 1 tablet by mouth daily.  . nitroGLYCERIN (NITROSTAT) 0.4 MG SL tablet Place 0.4 mg under the tongue every 5 (five) minutes as needed for chest pain (Up to 3 times).   . nystatin (NYSTATIN) powder Apply 1 application topically 3 (three) times daily.  . pantoprazole (PROTONIX) 40 MG tablet TAKE 2 TABLETS BY MOUTH  DAILY  . polyethylene glycol (MIRALAX / GLYCOLAX) 17 g packet Take 17 g by mouth daily.  . sodium bicarbonate 650 MG tablet Take 650 mg by mouth 2 (two) times daily. Evening   No facility-administered medications prior to visit.    Review of Systems  Constitutional: Negative for appetite change, chills and fever.  Respiratory: Negative for chest tightness, shortness of breath and wheezing.   Cardiovascular: Negative for chest pain and palpitations.  Gastrointestinal: Negative for abdominal pain, nausea and vomiting.       Objective    BP 135/65 (BP Location: Left Arm, Patient Position: Sitting, Cuff Size: Large)   Pulse (!) 52   Temp 98.4 F (36.9 C) (Oral)   Resp 18   Ht 5\' 9"  (1.753 m)   Wt 253 lb (114.8 kg)   SpO2 99%   BMI 37.36 kg/m     Physical Exam Vitals reviewed.  Constitutional:      Appearance: Normal appearance.  HENT:      Head: Normocephalic and atraumatic.     Right Ear: External ear normal.     Left Ear: External ear normal.  Eyes:     General: No scleral icterus.    Conjunctiva/sclera: Conjunctivae normal.  Cardiovascular:     Rate and Rhythm: Normal rate and regular rhythm.     Pulses: Normal pulses.     Heart sounds: Normal heart sounds.  Pulmonary:     Effort: Pulmonary effort is normal.     Breath sounds: Normal breath sounds.  Abdominal:     Palpations: Abdomen is soft.  Musculoskeletal:     Right lower leg: No edema.     Left lower leg: No edema.  Skin:    General: Skin is warm and  dry.  Neurological:     General: No focal deficit present.     Mental Status: He is alert and oriented to person, place, and time.  Psychiatric:        Mood and Affect: Mood normal.        Behavior: Behavior normal.        Thought Content: Thought content normal.        Judgment: Judgment normal.       Results for orders placed or performed in visit on 06/30/20  POCT glycosylated hemoglobin (Hb A1C)  Result Value Ref Range   Hemoglobin A1C 5.8 (A) 4.0 - 5.6 %   Est. average glucose Bld gHb Est-mCnc 120     Assessment & Plan     1. Essential hypertension Good control on amlodipine and metoprolol  2. Hyperglycemia Good control with A1c of 5.8 today - POCT glycosylated hemoglobin (Hb A1C)  3. Loss of taste I had a long discussion with patient about this.  I think more than likely he developed an asymptomatic Covid infection.  I do not think this is worth a work-up at this time as he is having no other symptoms.  He is to let me know if he wishes to pursue this.  4. Abdominal aortic aneurysm (AAA) without rupture (HCC)   5. Paroxysmal atrial fibrillation (HCC) On Eliquis  6. Dilated cardiomyopathy ([place])   7. Gastroesophageal reflux disease without esophagitis   8. Allergic rhinitis, unspecified seasonality, unspecified trigger   9. CKD (chronic kidney disease), stage IV  ([place])   10. Class 1 obesity due to excess calories with serious comorbidity and body mass index (BMI) of 34.0 to 34.9 in adult With hypertension, kidney disease, hyperglycemia, atrial fibrillation   No follow-ups on file.      I, [name], MD, have reviewed all documentation for this visit. The documentation on 06/30/20 for the exam, diagnosis, procedures, and orders are all accurate and complete.    Chandria Rookstool Cranford Mon, MD  [name] [phone] (phone) [phone] (fax)  [place]

## 2020-07-02 ENCOUNTER — Other Ambulatory Visit: Payer: Self-pay | Admitting: Family Medicine

## 2020-07-09 ENCOUNTER — Telehealth: Payer: Self-pay | Admitting: *Deleted

## 2020-07-09 NOTE — Chronic Care Management (AMB) (Signed)
  Care Management   Note  07/09/2020 Name: Ronald [name] [facility] MRN: [id number] DOB: 01/02/1946  Ronald [name] is a 75 y.o. year old male who is a primary care patient of [name]., MD and is actively engaged with the care management team. I reached out to [facility] by phone today to assist with re-scheduling a follow up visit with the Pharmacist  Follow up plan: Unsuccessful telephone outreach attempt made. A HIPAA compliant phone message was left for the patient providing contact information and requesting a return call. The care management team will reach out to the patient again over the next 7 days. If patient returns call to provider office, please advise to call [place] at [phone].  [place] Management

## 2020-07-11 NOTE — Chronic Care Management (AMB) (Signed)
  Care Management   Note  07/11/2020 Name: Ronald [name] [facility] MRN: [id number] DOB: 1946-01-03  Ronald [name] is a 75 y.o. year old male who is a primary care patient of [name]., MD and is actively engaged with the care management team. I reached out to [facility] by phone today to assist with re-scheduling a follow up visit with the Pharmacist  Follow up plan: Patient declines further follow up and engagement by the care management team. Appropriate care team members and provider have been notified via electronic communication.  The care management team is available to follow up with the patient after provider conversation with the patient regarding recommendation for care management engagement and subsequent re-referral to the care management team.   [place] Management

## 2020-07-14 ENCOUNTER — Telehealth: Payer: Self-pay

## 2020-07-15 ENCOUNTER — Other Ambulatory Visit: Payer: Self-pay | Admitting: Cardiovascular Disease

## 2020-07-21 ENCOUNTER — Encounter: Payer: Self-pay | Admitting: Family Medicine

## 2020-07-21 ENCOUNTER — Ambulatory Visit: Payer: Self-pay | Admitting: Family Medicine

## 2020-07-22 NOTE — Telephone Encounter (Signed)
Please review. Patient requesting refills on medication.

## 2020-07-23 MED ORDER — ALLOPURINOL 100 MG PO TABS
100.0000 mg | ORAL_TABLET | Freq: Every day | ORAL | 3 refills | Status: DC
Start: 2020-07-23 — End: 2021-07-13

## 2020-07-28 ENCOUNTER — Ambulatory Visit: Payer: Medicare Other | Admitting: Cardiovascular Disease

## 2020-07-28 ENCOUNTER — Encounter: Payer: Self-pay | Admitting: Cardiovascular Disease

## 2020-07-28 ENCOUNTER — Other Ambulatory Visit: Payer: Self-pay

## 2020-07-28 VITALS — BP 162/62 | HR 55 | Ht 69.0 in | Wt 249.8 lb

## 2020-07-28 DIAGNOSIS — E7800 Pure hypercholesterolemia, unspecified: Secondary | ICD-10-CM

## 2020-07-28 DIAGNOSIS — I255 Ischemic cardiomyopathy: Secondary | ICD-10-CM | POA: Diagnosis not present

## 2020-07-28 DIAGNOSIS — I5042 Chronic combined systolic (congestive) and diastolic (congestive) heart failure: Secondary | ICD-10-CM

## 2020-07-28 DIAGNOSIS — I714 Abdominal aortic aneurysm, without rupture, unspecified: Secondary | ICD-10-CM

## 2020-07-28 DIAGNOSIS — I2510 Atherosclerotic heart disease of native coronary artery without angina pectoris: Secondary | ICD-10-CM

## 2020-07-28 DIAGNOSIS — I480 Paroxysmal atrial fibrillation: Secondary | ICD-10-CM

## 2020-07-28 DIAGNOSIS — I10 Essential (primary) hypertension: Secondary | ICD-10-CM | POA: Diagnosis not present

## 2020-07-28 NOTE — Patient Instructions (Signed)
Medication Instructions:  No changes *If you need a refill on your cardiac medications before your next appointment, please call your pharmacy*   Lab Work: none If you have labs (blood work) drawn today and your tests are completely normal, you will receive your results only by: [name] MyChart Message (if you have MyChart) OR . A paper copy in the mail If you have any lab test that is abnormal or we need to change your treatment, we will call you to review the results.   Testing/Procedures: Vascular Ultrasound - AAA duplex SCHEDULED AT NL OFFICE - OK TO R/S TO [place]   Follow-Up: At [facility], you and your health needs are our priority.  As part of our continuing mission to provide you with exceptional heart care, we have created designated Provider Care Teams.  These Care Teams include your primary Cardiologist (physician) and Advanced Practice Providers (APPs -  Physician Assistants and Nurse Practitioners) who all work together to provide you with the care you need, when you need it.   Your next appointment:   6 month(s)  The format for your next appointment:   In Person  Provider:   You may see Lauree Chandler, MD or one of the following Advanced Practice Providers on your designated Care Team:    Melina Copa, PA-C  Ermalinda Barrios, PA-C  Other Instructions

## 2020-07-28 NOTE — Progress Notes (Signed)
Chief Complaint  Patient presents with  . Follow-up    CAD   History of Present Illness: 75 yo male with history of CAD s/p CABG in 1996 (LIMA-LAD, SVG-circumflex, SVG-RCA, SVG-diagonal) with redo bypass in 2007 (SVG-OM, SVG-RCA), ischemic cardiomyopathy, CKD, DM 2, HTN, HLD, AAA, renal artery stenosis, COPD, GERD, atrial fibrillation and PVCs here today for cardiac follow up. Echo in 2014 with normal LV systolic function. He was hospitalized at [facility] July 2016 with a NSTEMI. Cardiac cath with severe disease in the SVG to OM treated with a drug eluting stent. He was treated with Brilinta post PCI but developed dyspnea and was changed to Plavix. He was admitted to [facility] May 2017 with unstable angina and cardiac cath showed severe disease in the distal body/anastomosis of the SVG to Diagonal.  A drug eluting stent was placed in this vein graft. The LIMA to LAD was patent, SVG to OM was patent, SVG to PDA/PLA was patent. He was admitted to [facility] February 2018 with chest pain. Troponin was negative x 3. He did not have an ischemic evaluation. Echo February 2018 with  mild LV systolic dysfunction. LVEF=45-50%. I saw him in the office in January 2019 and he was volume overloaded. Lasix was started. He was admitted to [facility] February 2019 with unstable angina and cardiac cath showed patency of all of the bypass grafts. Echo February 2019 with normal LV systolic function, JOAC=16-60%, grade 1 diastolic dysfunction. He was admitted to [facility] January 2020 with atrial fib/flutter with RVR. Started on Xarelto and initially rate controlled with IV Cardizem. He was loaded with IV amiodarone and discharged home on po amiodarone and Lopressor (Coreg stopped during admission). His Plavix and Norvasc were stopped at discharge. His Norvasc was restarted in primary care. Echo January 2020 with LVEF=30-35%. Mild MR. Abdominal u/s March 2021 with 3.1 cm AAA.   He is here today for follow up. The patient denies any chest pain,  palpitations, lower extremity edema, orthopnea, PND, dizziness, near syncope or syncope. He has baseline chronic dyspnea. No recent changes.   Primary Care Physician: [name]., MD  Past Medical History:  Diagnosis Date  . AAA (abdominal aortic aneurysm) ([place])    a. 3cm by [place] 2015.  [name] Arthritis    "hips; back" (12/13/2014)  . CAD (coronary artery disease) 2007   a. s/p CABG- IMA-LAD, VG-Cx, VG-RCA, VG-diag in 1999. B. sp redo CABG- VG-OM, VG-RCA in 2007 due to VG disease. c. NSTEMI 11/2014 s/p DES to SVG-OM from the Y graft.d. PTCA/DES x 1 distal body of SVG to Diagonal.09/2015  . Chronic combined systolic and diastolic CHF (congestive heart failure) ([place])    a. remote EF 40-45% in 2006. b. Normal EF 2014. b. Echo 07/2016 EF 45-50%, grade 1 DD.  [name] Chronic lower back pain   . CKD (chronic kidney disease), stage IV ([place])   . COPD (chronic obstructive pulmonary disease) ([place])   . Deafness in left ear   . Degenerative disc disease, lumbar   . Dilated cardiomyopathy ([place]) 10/07/2015  . Emphysema   . Esophageal stricture 07/02/1998   EGD  . GERD (gastroesophageal reflux disease)   . History of gout    "last flareup was in 2007" (12/13/2014)  . History of hiatal hernia   . Hyperlipidemia   . Hypertension   . Ischemic cardiomyopathy 2006   EF 40% to 50% by 2D echo in 2006;  Echo 12/31/12: Mild LVH, EF 50-55%, normal wall motion.   [name]  PVC's (premature ventricular contractions)   . Renal artery stenosis ([place])    a. noted on CT 2008.  . Type II diabetes mellitus ([place])    Diet control   . Walking pneumonia 1990's    Past Surgical History:  Procedure Laterality Date  . CARDIAC CATHETERIZATION  "several"  . CARDIAC CATHETERIZATION N/A 12/13/2014   Procedure: Left Heart Cath and Coronary Angiography;  Surgeon: Jettie Booze, MD;  Location: [place] CV LAB;  Service: Cardiovascular;  Laterality: N/A;  . CARDIAC CATHETERIZATION  12/13/2014   Procedure: Coronary Stent Intervention;   Surgeon: Jettie Booze, MD;  Location: [place] CV LAB;  Service: Cardiovascular;;  . CARDIAC CATHETERIZATION N/A 10/07/2015   Procedure: Left Heart Cath and Cors/Grafts Angiography;  Surgeon: Burnell Blanks, MD;  Location: [place] CV LAB;  Service: Cardiovascular;  Laterality: N/A;  . CARDIAC CATHETERIZATION N/A 10/07/2015   Procedure: Coronary Stent Intervention;  Surgeon: Burnell Blanks, MD;  Location: [place] CV LAB;  Service: Cardiovascular;  Laterality: N/A;  . CORONARY ANGIOPLASTY  "several"  . CORONARY ANGIOPLASTY WITH STENT PLACEMENT  2005; 12/13/2014   "2; 1"  . CORONARY ARTERY BYPASS GRAFT  1996   CABG X5  . CORONARY ARTERY BYPASS GRAFT  March 2007   CABG X3  . ESOPHAGOGASTRODUODENOSCOPY (EGD) WITH ESOPHAGEAL DILATION  2000  . GREEN LIGHT LASER TURP (TRANSURETHRAL RESECTION OF PROSTATE  2000's   "not cancerous"  . HERNIA REPAIR    . LAPAROSCOPIC CHOLECYSTECTOMY    . LEFT HEART CATH AND CORS/GRAFTS ANGIOGRAPHY N/A 07/28/2017   Procedure: LEFT HEART CATH AND CORS/GRAFTS ANGIOGRAPHY;  Surgeon: Troy Sine, MD;  Location: [place] CV LAB;  Service: Cardiovascular;  Laterality: N/A;  . LUNG SURGERY  1996   "S/P CABG, had to put staple in lung after it had collapsed"  . UMBILICAL HERNIA REPAIR     w/chole    Current Outpatient Medications  Medication Sig Dispense Refill  . allopurinol (ZYLOPRIM) 100 MG tablet Take 1 tablet (100 mg total) by mouth at bedtime. 90 tablet 3  . amiodarone (PACERONE) 200 MG tablet TAKE 1 TABLET BY MOUTH  DAILY 90 tablet 3  . amLODipine (NORVASC) 10 MG tablet TAKE 1 TABLET BY MOUTH EVERY DAY 90 tablet 1  . apixaban (ELIQUIS) 5 MG TABS tablet Take 1 tablet (5 mg total) by mouth 2 (two) times daily. 180 tablet 3  . atorvastatin (LIPITOR) 40 MG tablet TAKE 1 TABLET BY MOUTH  DAILY 90 tablet 2  . Cholecalciferol (VITAMIN D-3) 125 MCG (5000 UT) TABS Take 2,000 Units by mouth daily.     . furosemide (LASIX) 40 MG tablet  Take 40 mg by mouth daily as needed for fluid or edema.   6  . isosorbide mononitrate (IMDUR) 30 MG 24 hr tablet TAKE 1 TABLET BY MOUTH  DAILY 90 tablet 3  . levothyroxine (SYNTHROID) 75 MCG tablet Take 1 tablet (75 mcg total) by mouth daily before breakfast. 90 tablet 3  . loratadine (CLARITIN) 10 MG tablet Take 10 mg by mouth daily as needed for allergies.    . magnesium oxide (MAG-OX) 400 MG tablet TAKE 1 TABLET BY MOUTH TWICE A DAY 180 tablet 1  . Melatonin 5 MG TABS Take 1 tablet by mouth at bedtime.    . metoprolol tartrate (LOPRESSOR) 100 MG tablet TAKE 1 TABLET BY MOUTH  TWICE DAILY 180 tablet 3  . Multiple Vitamin (MULTIVITAMIN PO) Take 1 tablet by mouth daily.    [name]  nitroGLYCERIN (NITROSTAT) 0.4 MG SL tablet Place 0.4 mg under the tongue every 5 (five) minutes as needed for chest pain (Up to 3 times).     . pantoprazole (PROTONIX) 40 MG tablet TAKE 2 TABLETS BY MOUTH  DAILY 180 tablet 3  . polyethylene glycol (MIRALAX / GLYCOLAX) 17 g packet Take 17 g by mouth daily.    . sodium bicarbonate 650 MG tablet Take 650 mg by mouth 2 (two) times daily. Evening     No current facility-administered medications for this visit.    Allergies  Allergen Reactions  . Predicort [Prednisolone] Other (See Comments)    Stomach pain  . Ciprofloxacin     GI upset  . Hydrochlorothiazide Other (See Comments)    Dehydration  . Hydrocodone     Stomach upset  . Hydrocodone-Acetaminophen     Stomach upset  . Hydrocodone-Acetaminophen Nausea Only  . Loratadine Other (See Comments)    Other reaction(s): Unknown  . Sulfa Antibiotics Other (See Comments)    Cannot recall  . Sulfacetamide Sodium Other (See Comments)    Cannot recall  . Sulfasalazine     Other reaction(s): Other (See Comments) Cannot recall  . Hydrocodone-Acetaminophen Nausea Only  . Penicillins Hives and Rash    Has patient had a PCN reaction causing immediate rash, facial/tongue/throat swelling, SOB or lightheadedness with  hypotension: YES Has patient had a PCN reaction causing severe rash involving mucus membranes or skin necrosis: NO Has patient had a PCN reaction that required hospitalization NO Has patient had a PCN reaction occurring within the last 10 years:NO If all of the above answers are "NO", then may proceed with Cephalosporin use.    Social History   Socioeconomic History  . Marital status: Married    Spouse name: Not on file  . Number of children: 2  . Years of education: Not on file  . Highest education level: 8th grade  Occupational History  . Occupation: Retired  Tobacco Use  . Smoking status: Former Smoker    Packs/day: 3.00    Years: 20.00    Pack years: 60.00    Types: Cigarettes    Quit date: 07/25/1986    Years since quitting: 34.0  . Smokeless tobacco: Never Used  Vaping Use  . Vaping Use: Never used  Substance and Sexual Activity  . Alcohol use: No    Alcohol/week: 0.0 standard drinks  . Drug use: No  . Sexual activity: Not Currently  Other Topics Concern  . Not on file  Social History Narrative   Did auto salvage work.   Lives at home with his wife.  Independent at baseline.   Social Determinants of Health   Financial Resource Strain: Low Risk   . Difficulty of Paying Living Expenses: Not hard at all  Food Insecurity: No Food Insecurity  . Worried About Charity fundraiser in the Last Year: Never true  . Ran Out of Food in the Last Year: Never true  Transportation Needs: No Transportation Needs  . Lack of Transportation (Medical): No  . Lack of Transportation (Non-Medical): No  Physical Activity: Inactive  . Days of Exercise per Week: 0 days  . Minutes of Exercise per Session: 0 min  Stress: No Stress Concern Present  . Feeling of Stress : Not at all  Social Connections: Moderately Isolated  . Frequency of Communication with Friends and Family: Once a week  . Frequency of Social Gatherings with Friends and Family: More than three times a week  .  Attends  Religious Services: Never  . Active Member of Clubs or Organizations: No  . Attends Archivist Meetings: Never  . Marital Status: Married  Human resources officer Violence: Not At Risk  . Fear of Current or Ex-Partner: No  . Emotionally Abused: No  . Physically Abused: No  . Sexually Abused: No    Family History  Problem Relation Age of Onset  . Heart attack Mother        MI  . Stroke Mother   . Heart disease Mother   . Hypertension Mother   . Hyperlipidemia Mother   . Heart disease Father   . Rheumatic fever Father   . Colon cancer Neg Hx     Review of Systems:  As stated in the HPI and otherwise negative.   BP (!) 162/62   Pulse (!) 55   Ht 5\' 9"  (1.753 m)   Wt 249 lb 12.8 oz (113.3 kg)   SpO2 97%   BMI 36.89 kg/m   Physical Examination:  General: Well developed, well nourished, NAD  HEENT: OP clear, mucus membranes moist  SKIN: warm, dry. No rashes. Neuro: No focal deficits  Musculoskeletal: Muscle strength 5/5 all ext  Psychiatric: Mood and affect normal  Neck: No JVD, no carotid bruits, no thyromegaly, no lymphadenopathy.  Lungs:Clear bilaterally, no wheezes, rhonci, crackles Cardiovascular: Regular rate and rhythm. No murmurs, gallops or rubs. Abdomen:Soft. Bowel sounds present. Non-tender.  Extremities: No lower extremity edema. Pulses are 2 + in the bilateral DP/PT.  Echo January 2020: Left ventricle: The cavity size was moderately dilated. Systolic  function was moderately to severely reduced. The estimated  ejection fraction was in the range of 30% to 35%. Diffuse  hypokinesis. The study is not technically sufficient to allow  evaluation of LV diastolic function.  - Mitral valve: There was mild regurgitation.  - Left atrium: The atrium was mildly dilated.  - Right ventricle: The cavity size was mildly dilated. Wall  thickness was normal. Systolic function was mildly reduced  Cardiac cath February 2019: Significant native CAD with  total occlusion of the LAD after the takeoff of the first diagonal vessel; total occlusion of the proximal left circumflex coronary artery; and total occlusion of the proximal RCA with antegrade bridging collaterals. Patent LIMA to LAD. Patent Y vein graft from the 1996 surgery which supplies the diagonal vessel and distal circumflex marginal vessel.  There is diffuse narrowing of 50% in the midportion of the Y graft supplying the diagonal vessel and a patent distal stent extending to the ostium of the diagonal.  The Y graft supplying the distal marginal is free of significant disease and has mild luminal irregularity. Patent SVG supplying the distal RCA from the 2007 surgery with a stent in the proximal portion of the graft with 30% narrowings in the proximal and mid segment. LVEDP 18 mm  Diagnostic Diagram        EKG:  EKG is not ordered today. The ekg ordered today demonstrates   Recent Labs: 10/10/2019: NT-Pro BNP 1,020 03/20/2020: ALT 24; BUN 29; Creatinine, Ser 2.46; Hemoglobin 11.9; Platelets 128; Potassium 4.8; Sodium 145; TSH 5.330   Lipid Panel    Component Value Date/Time   CHOL 147 03/20/2020 1038   TRIG 80 03/20/2020 1038   HDL 52 03/20/2020 1038   CHOLHDL 2.8 03/20/2020 1038   CHOLHDL 4.5 12/13/2014 0423   VLDL 25 12/13/2014 0423   LDLCALC 80 03/20/2020 1038     Wt Readings from Last 3  Encounters:  07/28/20 249 lb 12.8 oz (113.3 kg)  06/30/20 253 lb (114.8 kg)  04/28/20 255 lb (115.7 kg)     Other studies Reviewed: Additional studies/ records that were reviewed today include: . Review of the above records demonstrates:    Assessment and Plan:   1. CAD s/p CABG without angina: Cardiac cath February 2019 with 4 patent vein grafts. He has no chest pain. Will continue statin, Imdur and beta blocker. He is off of ASA due to easy bruising. He is also on Eliquis.       2. Ischemic Cardiomyopathy: LVEF =35% by echo at [facility] January 2020. No Ace-inh or ARB due to  CKD. Will continue beta blocker.   3. Hypertension: BP is controlled at home. No changes  4. Hyperlipidemia: Lipids followed in primary care. LDL near goal October 2021. Continue statin  5. CKD: Followed by Nephrology. Ace-inh stopped due to renal insufficiency.   6. Chronic systolic CHF: No volume overload on exam. Continue lasix as needed.     7. PVCs/Bigeminy: He has no palpitations. Continue beta blocker  8. AAA: Slight increase from 2.8 cm to 3.1 cm by u/s march 2021. Repeat u/s now.     9. Atrial fibrillation, paroxysmal: Sinus today. Will continue beta blocker, amiodarone and Eliquis. Chest x-ray ok November 2021.     10. Sleep apnea: he is on CPAP.  Current medicines are reviewed at length with the patient today.  The patient does not have concerns regarding medicines.  The following changes have been made:  no change  Labs/ tests ordered today include:   Orders Placed This Encounter  Procedures  . VAS [place] AAA DUPLEX    Disposition:   FU with me in 6 months.    Signed, Lauree Chandler, MD 07/28/2020 4:05 PM    [facility] [place], [place], [place]  [address] Phone: [phone]; Fax: [phone]

## 2020-08-01 ENCOUNTER — Other Ambulatory Visit: Payer: Self-pay | Admitting: Cardiovascular Disease

## 2020-08-01 ENCOUNTER — Other Ambulatory Visit: Payer: Self-pay | Admitting: Otolaryngology

## 2020-08-01 DIAGNOSIS — I714 Abdominal aortic aneurysm, without rupture, unspecified: Secondary | ICD-10-CM

## 2020-08-18 ENCOUNTER — Other Ambulatory Visit: Payer: Medicare Other

## 2020-08-18 ENCOUNTER — Other Ambulatory Visit (HOSPITAL_COMMUNITY): Payer: Medicare Other

## 2020-08-21 DIAGNOSIS — N184 Chronic kidney disease, stage 4 (severe): Secondary | ICD-10-CM | POA: Diagnosis not present

## 2020-08-27 DIAGNOSIS — R801 Persistent proteinuria, unspecified: Secondary | ICD-10-CM | POA: Diagnosis not present

## 2020-08-27 DIAGNOSIS — R600 Localized edema: Secondary | ICD-10-CM | POA: Diagnosis not present

## 2020-08-27 DIAGNOSIS — N2581 Secondary hyperparathyroidism of renal origin: Secondary | ICD-10-CM | POA: Diagnosis not present

## 2020-08-27 DIAGNOSIS — N184 Chronic kidney disease, stage 4 (severe): Secondary | ICD-10-CM | POA: Diagnosis not present

## 2020-08-27 DIAGNOSIS — I701 Atherosclerosis of renal artery: Secondary | ICD-10-CM | POA: Diagnosis not present

## 2020-08-27 DIAGNOSIS — I10 Essential (primary) hypertension: Secondary | ICD-10-CM | POA: Diagnosis not present

## 2020-09-01 ENCOUNTER — Encounter: Payer: Self-pay | Admitting: Family Medicine

## 2020-09-01 ENCOUNTER — Other Ambulatory Visit: Payer: Self-pay

## 2020-09-01 ENCOUNTER — Ambulatory Visit (INDEPENDENT_AMBULATORY_CARE_PROVIDER_SITE_OTHER): Payer: Medicare Other

## 2020-09-01 DIAGNOSIS — I714 Abdominal aortic aneurysm, without rupture, unspecified: Secondary | ICD-10-CM

## 2020-09-01 MED ORDER — ATORVASTATIN CALCIUM 40 MG PO TABS: 1.0000 | ORAL_TABLET | Freq: Every day | ORAL | 2 refills | Status: DC

## 2020-10-02 ENCOUNTER — Other Ambulatory Visit: Payer: Self-pay | Admitting: Family Medicine

## 2020-10-02 DIAGNOSIS — E039 Hypothyroidism, unspecified: Secondary | ICD-10-CM

## 2020-10-02 NOTE — Telephone Encounter (Signed)
Requested Prescriptions  Pending Prescriptions Disp Refills  . levothyroxine (SYNTHROID) 75 MCG tablet [Pharmacy Med Name: LEVOTHYROXINE 75 MCG TABLET] 90 tablet 2    Sig: TAKE 1 TABLET (75 MCG TOTAL) BY MOUTH DAILY BEFORE BREAKFAST.     Endocrinology:  Hypothyroid Agents Failed - 10/02/2020  2:02 AM      Failed - TSH needs to be rechecked within 3 months after an abnormal result. Refill until TSH is due.      Failed - TSH in normal range and within 360 days    TSH  Date Value Ref Range Status  03/20/2020 5.330 (H) 0.450 - 4.500 uIU/mL Final         Passed - Valid encounter within last 12 months    Recent Outpatient Visits          3 months ago Essential hypertension   [facility] [name]., MD   5 months ago Annual physical exam   [facility] [name]., MD   6 months ago Hyperglycemia   [facility] [name]., MD   9 months ago Essential hypertension   [facility] [name]., MD   11 months ago Hypothyroidism, unspecified type   [facility] [name]., MD      Future Appointments            In 1 month [name]., MD [facility], [place]   In 3 months Angelena Form, Annita Brod, MD [facility], [facility]

## 2020-10-16 ENCOUNTER — Telehealth: Payer: Self-pay

## 2020-10-16 NOTE — Progress Notes (Signed)
    Chronic Care Management Pharmacy Assistant   Name: Toretto Tingler [facility]  MRN: [id number] DOB: 23-Jan-1946  Reason for Encounter: Medication Review/General Adherence Call   Recent office visits:  06/30/2020 Dr. [name] MD (PCP)  04/28/2020 Dr. [name] MD (PCP) Recommended stopping Eliquis due to poor kidney function ,Recommended starting warfarin for anticoagulation Going to ask cardiology before switching.  03/19/2020 Dr.Gilbert MD (PCP)   Recent consult visits:  08/28/2020 Dr. Angelena Form MD (Cardiology)  Start Enalapril 20 mg 08/27/2020  Dr. Candiss Norse MD (Neprhology) 07/28/2020 Dr. Angelena Form MD (Cardiology)  05/26/2020 Dr. Candiss Norse MD (Neprhology)   Hospital visits:  None in previous 6 months  Medications: Outpatient Encounter Medications as of 10/16/2020  Medication Sig  . allopurinol (ZYLOPRIM) 100 MG tablet Take 1 tablet (100 mg total) by mouth at bedtime.  [name] amiodarone (PACERONE) 200 MG tablet TAKE 1 TABLET BY MOUTH  DAILY  . amLODipine (NORVASC) 10 MG tablet TAKE 1 TABLET BY MOUTH EVERY DAY  . apixaban (ELIQUIS) 5 MG TABS tablet Take 1 tablet (5 mg total) by mouth 2 (two) times daily.  [name] atorvastatin (LIPITOR) 40 MG tablet Take 1 tablet (40 mg total) by mouth daily.  . Cholecalciferol (VITAMIN D-3) 125 MCG (5000 UT) TABS Take 2,000 Units by mouth daily.   . furosemide (LASIX) 40 MG tablet Take 40 mg by mouth daily as needed for fluid or edema.   . isosorbide mononitrate (IMDUR) 30 MG 24 hr tablet TAKE 1 TABLET BY MOUTH  DAILY  . levothyroxine (SYNTHROID) 75 MCG tablet TAKE 1 TABLET (75 MCG TOTAL) BY MOUTH DAILY BEFORE BREAKFAST.  [name] loratadine (CLARITIN) 10 MG tablet Take 10 mg by mouth daily as needed for allergies.  . magnesium oxide (MAG-OX) 400 MG tablet TAKE 1 TABLET BY MOUTH TWICE A DAY  . Melatonin 5 MG TABS Take 1 tablet by mouth at bedtime.  . metoprolol tartrate (LOPRESSOR) 100 MG tablet TAKE 1 TABLET BY MOUTH  TWICE DAILY  . Multiple Vitamin (MULTIVITAMIN PO) Take 1 tablet by  mouth daily.  . nitroGLYCERIN (NITROSTAT) 0.4 MG SL tablet Place 0.4 mg under the tongue every 5 (five) minutes as needed for chest pain (Up to 3 times).   . pantoprazole (PROTONIX) 40 MG tablet TAKE 2 TABLETS BY MOUTH  DAILY  . polyethylene glycol (MIRALAX / GLYCOLAX) 17 g packet Take 17 g by mouth daily.  . sodium bicarbonate 650 MG tablet Take 650 mg by mouth 2 (two) times daily. Evening   No facility-administered encounter medications on file as of 10/16/2020.   Star Rating Drugs: Atorvastatin 40 mg last filled on 09/01/2020 for 90 day supply at [facility].  Called patient and discussed medication adherence  with patient, no issues at this time with current medication.   Patient wife denies ED visit since his last CPP follow up.  Patient wife denies any side effects with his medication. Patient wife denies any problems with his current pharmacy     Patient wife states patient is doing "fine". Schedule a follow up telephone appointment  with Clinical pharmacist on 12/08/2020 at 3:00 pm.  [place] Pharmacist Assistant [phone]

## 2020-10-27 ENCOUNTER — Other Ambulatory Visit: Payer: Self-pay | Admitting: Family Medicine

## 2020-10-28 ENCOUNTER — Ambulatory Visit: Payer: Self-pay | Admitting: Family Medicine

## 2020-11-03 ENCOUNTER — Other Ambulatory Visit: Payer: Self-pay

## 2020-11-03 ENCOUNTER — Encounter: Payer: Self-pay | Admitting: Family Medicine

## 2020-11-03 ENCOUNTER — Ambulatory Visit (INDEPENDENT_AMBULATORY_CARE_PROVIDER_SITE_OTHER): Payer: Medicare Other | Admitting: Family Medicine

## 2020-11-03 VITALS — BP 154/74 | HR 58 | Temp 98.0°F | Resp 18 | Ht 69.0 in | Wt 248.0 lb

## 2020-11-03 DIAGNOSIS — I420 Dilated cardiomyopathy: Secondary | ICD-10-CM | POA: Diagnosis not present

## 2020-11-03 DIAGNOSIS — N184 Chronic kidney disease, stage 4 (severe): Secondary | ICD-10-CM

## 2020-11-03 DIAGNOSIS — K30 Functional dyspepsia: Secondary | ICD-10-CM | POA: Diagnosis not present

## 2020-11-03 DIAGNOSIS — I10 Essential (primary) hypertension: Secondary | ICD-10-CM

## 2020-11-03 DIAGNOSIS — E6609 Other obesity due to excess calories: Secondary | ICD-10-CM

## 2020-11-03 DIAGNOSIS — R739 Hyperglycemia, unspecified: Secondary | ICD-10-CM | POA: Diagnosis not present

## 2020-11-03 DIAGNOSIS — K219 Gastro-esophageal reflux disease without esophagitis: Secondary | ICD-10-CM

## 2020-11-03 DIAGNOSIS — Z6834 Body mass index (BMI) 34.0-34.9, adult: Secondary | ICD-10-CM

## 2020-11-03 LAB — POCT GLYCOSYLATED HEMOGLOBIN (HGB A1C)
Est. average glucose Bld gHb Est-mCnc: 120
Hemoglobin A1C: 5.8 % — AB (ref 4.0–5.6)

## 2020-11-03 MED ORDER — SUCRALFATE 1 G PO TABS: 1.0000 g | ORAL_TABLET | Freq: Three times a day (TID) | ORAL | 3 refills | Status: DC

## 2020-11-03 MED ORDER — PANTOPRAZOLE SODIUM 40 MG PO TBEC: 2.0000 | DELAYED_RELEASE_TABLET | Freq: Every day | ORAL | 0 refills | Status: DC

## 2020-11-03 NOTE — Progress Notes (Signed)
I,[name],acting as a scribe for [name], MD.,have  documented all relevant documentation on the behalf of [name],  MD,as directed by  [name], MD while in the presence of [name], MD.      Established patient visit      Patient: Ronald [name] [facility]   DOB: [birth year]   75 y.o. Male  MRN: [id number]  Visit Date: 11/03/2020    Today's healthcare provider: [name], MD     Chief Complaint   Patient presents with    Follow-up    Hypertension    Prediabetes     Subjective      HPI   Patient states that his "stomach" bothers him all the time.  He  describes epigastric discomfort.  He has chronic fatigue which has been going on for years.  He has no  exertional dyspnea or chest pain.  No hematemesis hematochezia weight  loss.  Hypertension, follow-up    BP Readings from Last 3 Encounters:   11/03/20 (!) 154/74   07/28/20 (!) 162/62   06/30/20 135/65    Wt Readings from Last 3 Encounters:   11/03/20 248 lb (112.5 kg)   07/28/20 249 lb 12.8 oz (113.3 kg)   06/30/20 253 lb (114.8 kg)        He was last seen for hypertension 4 months ago.   BP at that visit was 135/65. Management since that visit includes no  medication changes.    He reports good compliance with treatment.  He is not having side effects. none  He is following a Regular diet.  He is not exercising.  He does not smoke.    Use of agents associated with hypertension: none.     Outside blood pressures are not checking.  Symptoms:  Pertinent labs:  Lab Results   Component Value Date    CHOL 147 03/20/2020    HDL 52 03/20/2020    LDLCALC 80 03/20/2020    TRIG 80 03/20/2020    CHOLHDL 2.8 03/20/2020    Lab Results   Component Value Date    NA 145 (H) 03/20/2020    K 4.8 03/20/2020    CREATININE 2.46 (H) 03/20/2020    GFRNONAA 25 (L) 03/20/2020    GFRAA 28 (L) 03/20/2020    GLUCOSE 124 (H) 03/20/2020        The ASCVD Risk score Mikey Bussing DC Jr.,  et al., 2013) failed to calculate for  the following reasons:    The patient has a prior MI or stroke diagnosis     Prediabetes, Follow-up    Lab Results   Component Value Date    HGBA1C 5.8 (A) 11/03/2020    HGBA1C 5.8 (A) 06/30/2020    HGBA1C 5.9 (H) 03/20/2020    GLUCOSE 124 (H) 03/20/2020    GLUCOSE 122 (H) 10/10/2019    GLUCOSE 130 (H) 05/01/2019       Last seen for for this4 months ago.   Management since that visit includes no medication changes.  Current symptoms include none and have been unchanged.    Prior visit with dietician: no  Current diet: well balanced  Current exercise: none    Pertinent Labs:      Component Value Date/Time    CHOL 147 03/20/2020 1038    TRIG 80 03/20/2020 1038    CHOLHDL 2.8 03/20/2020 1038    CHOLHDL 4.5 12/13/2014 0423    CREATININE 2.46 (  H) 03/20/2020 1038    CREATININE 2.44 (H) 11/24/2015 1546       Wt Readings from Last 3 Encounters:   11/03/20 248 lb (112.5 kg)   07/28/20 249 lb 12.8 oz (113.3 kg)   06/30/20 253 lb (114.8 kg)     Lipid/Cholesterol, Follow-up    Last lipid panel Other pertinent labs   Lab Results   Component Value Date    CHOL 147 03/20/2020    HDL 52 03/20/2020    LDLCALC 80 03/20/2020    TRIG 80 03/20/2020    CHOLHDL 2.8 03/20/2020    Lab Results   Component Value Date    ALT 24 03/20/2020    AST 21 03/20/2020    PLT 128 (L) 03/20/2020    TSH 5.330 (H) 03/20/2020        He was last seen for this 4 months ago.   Management since that visit includes no medication changes.    He reports good compliance with treatment.  He is not having side effects. none      Patient Active Problem List    Diagnosis Date Noted    Hyperparathyroidism due to renal insufficiency ([place]) 05/18/2019    Bursitis of hip 12/18/2018    Pain in lower limb 12/18/2018    A-fib ([place]) 06/10/2018    CKD (chronic kidney disease), stage IV ([place]) 07/29/2017    Obesity 07/29/2017    Unstable angina ([place]) 07/27/2017     Atherosclerosis of native arteries of extremity with intermittent  claudication (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    HLD (hyperlipidemia) 10/04/2014    BP (high blood pressure) 10/04/2014    Osteoarthrosis 10/04/2014    Adult BMI 30+ 10/04/2014    Basal cell papilloma 10/04/2014    Hypertensive heart disease without CHF 12/30/2012    Genital candidiasis in male 10/25/2012    Urge incontinence of urine 10/24/2012    Benign prostatic hyperplasia with urinary obstruction 10/24/2012    Incomplete emptying of bladder 10/24/2012    Abdominal pain 11/09/2010    Shortness of breath 11/12/2009    Renal artery stenosis      Mixed hyperlipidemia 09/04/2008    History of redo bypass grafting 09/04/2008    Diabetes mellitus with nephropathy [facility])      Past Medical History:   Diagnosis Date    AAA (abdominal aortic aneurysm) ([place])     a. 3cm by [place] 2015.    Arthritis     "hips; back" (12/13/2014)    CAD (coronary artery disease) 2007    a. s/p CABG- IMA-LAD, VG-Cx, VG-RCA, VG-diag in 1999. B. sp redo CABG-  VG-OM, VG-RCA in 2007 due to VG disease. c. NSTEMI 11/2014 s/p DES to  SVG-OM from the Y graft.d. PTCA/DES x 1 distal body of SVG to  Diagonal.09/2015    Chronic combined systolic and diastolic CHF (congestive heart failure)  ([place])     a. remote EF 40-45% in 2006. b.  Normal EF 2014. b. Echo 07/2016 EF 45-50%,  grade 1 DD.    Chronic lower back pain     CKD (chronic kidney disease),  stage IV (HCC)     COPD (chronic obstructive pulmonary disease) (HCC)     Deafness in left ear     Degenerative disc disease, lumbar     Dilated cardiomyopathy ([place]) 10/07/2015    Emphysema     Esophageal stricture 07/02/1998    EGD    GERD (gastroesophageal reflux disease)     History of gout     "last flareup was in 2007" (12/13/2014)    History of hiatal hernia     Hyperlipidemia     Hypertension     Ischemic cardiomyopathy 2006    EF 40% to 50% by 2D echo in 2006;  Echo 12/31/12: Mild LVH, EF 50-55%,  normal wall motion.     PVC's (premature ventricular contractions)     Renal artery stenosis ([place])     a. noted on CT 2008.    Type II diabetes mellitus (HCC)     Diet control     Walking pneumonia 1990's           Medications:  Outpatient Medications Prior to Visit   Medication Sig    ACETAMINOPHEN PO Take 650 mg by mouth. 2 at bedtime    allopurinol (ZYLOPRIM) 100 MG tablet Take 1 tablet (100 mg total) by  mouth at bedtime.    amLODipine (NORVASC) 10 MG tablet TAKE 1 TABLET BY MOUTH EVERY DAY    apixaban (ELIQUIS) 5 MG TABS tablet Take 1 tablet (5 mg total) by mouth 2  (two) times daily.    atorvastatin (LIPITOR) 40 MG tablet Take 1 tablet (40 mg total) by mouth  daily.    Cholecalciferol (VITAMIN D-3) 125 MCG (5000 UT) TABS Take 2,000 Units by  mouth daily.     enalapril (VASOTEC) 20 MG tablet Take 20 mg by mouth daily.    furosemide (LASIX) 40 MG tablet Take 40 mg by mouth daily as needed for  fluid or edema.     isosorbide mononitrate (IMDUR) 30 MG 24 hr tablet TAKE 1 TABLET BY MOUTH   DAILY    levothyroxine (SYNTHROID) 75 MCG tablet TAKE 1 TABLET (75 MCG TOTAL) BY  MOUTH DAILY BEFORE BREAKFAST.    loratadine (CLARITIN) 10 MG tablet Take 10 mg by mouth daily as needed  for allergies.    magnesium oxide (MAG-OX) 400 (240 Mg) MG tablet TAKE 1 TABLET BY MOUTH  TWICE A DAY    Melatonin 5 MG TABS Take 1 tablet by mouth at bedtime.    metoprolol tartrate (LOPRESSOR) 100 MG tablet  TAKE 1 TABLET BY MOUTH   TWICE DAILY    Multiple Vitamin (MULTIVITAMIN PO) Take 1 tablet by mouth daily.    nitroGLYCERIN (NITROSTAT) 0.4 MG SL tablet Place 0.4 mg under the tongue  every 5 (five) minutes as needed for chest pain (Up to 3 times).     polyethylene glycol (MIRALAX / GLYCOLAX) 17 g packet Take 17 g by mouth  daily.    sodium bicarbonate 650 MG tablet Take 650 mg by mouth 2 (two) times  daily. Evening    [DISCONTINUED] pantoprazole (PROTONIX) 40 MG tablet TAKE 2 TABLETS BY  MOUTH  DAILY    amiodarone (PACERONE) 200 MG tablet TAKE 1 TABLET BY MOUTH  DAILY  (Patient not taking: Reported on 11/03/2020)     No facility-administered medications prior to visit.  Review of Systems         Objective      BP (!) 154/74 (BP Location: Left Arm, Patient Position: Sitting, Cuff  Size: Large)   Pulse (!) 58   Temp 98 F (36.7 C) (Oral)   Resp 18    Ht 5\' 9"  (1.753 m)   Wt 248 lb (112.5 kg)   SpO2 97%   BMI 36.62  kg/m   BP Readings from Last 3 Encounters:   11/03/20 (!) 154/74   07/28/20 (!) 162/62   06/30/20 135/65     Wt Readings from Last 3 Encounters:   11/03/20 248 lb (112.5 kg)   07/28/20 249 lb 12.8 oz (113.3 kg)   06/30/20 253 lb (114.8 kg)           Physical Exam  Vitals reviewed.   Constitutional:       Appearance: Normal appearance.   HENT:      Head: Normocephalic and atraumatic.      Right Ear: External ear normal.      Left Ear: External ear normal.   Eyes:      General: No scleral icterus.     Conjunctiva/sclera: Conjunctivae normal.   Cardiovascular:      Rate and Rhythm: Normal rate and regular rhythm.      Pulses: Normal pulses.      Heart sounds: Normal heart sounds.   Pulmonary:      Effort: Pulmonary effort is normal.      Breath sounds: Normal breath sounds.   Abdominal:      Palpations: Abdomen is soft.      Tenderness: There is no abdominal tenderness.   Musculoskeletal:      Right lower leg: No edema.      Left lower leg: No  edema.   Skin:     General: Skin is warm and dry.   Neurological:      General: No focal deficit present.      Mental Status: He is alert and oriented to person, place, and time.   Psychiatric:         Mood and Affect: Mood normal.         Behavior: Behavior normal.         Thought Content: Thought content normal.         Judgment: Judgment normal.          Results for orders placed or performed in visit on 11/03/20   POCT glycosylated hemoglobin (Hb A1C)   Result Value Ref Range    Hemoglobin A1C 5.8 (A) 4.0 - 5.6 %    Est. average glucose Bld gHb Est-mCnc 120       Assessment & Plan        1. Essential hypertension  Controlled on amlodipine 10 Toprol 100 mg    2. Hyperglycemia  A1c under good control at 5.8  - POCT glycosylated hemoglobin (Hb A1C)    3. Gastroesophageal reflux disease without esophagitis  Try pantoprazole daily and sucralfate 1 g before meals and at bedtime.  - pantoprazole (PROTONIX) 40 MG tablet; Take 2 tablets (80 mg total) by  mouth daily.  Dispense: 30 tablet; Refill: 0    4. Chronic upset stomach    - sucralfate (CARAFATE) 1 g tablet; Take 1 tablet (1 g total) by mouth 4  (four) times daily -  before meals and at bedtime.  Dispense: 360 tablet;  Refill: 3      5. Dilated cardiomyopathy ([place])  Followed by cardiology    6. CKD (chronic kidney disease), stage IV ([place])  Followed by nephrology.  Continue to avoid anti-inflammatories.  All risk factors treated    7. Class 1 obesity due to excess calories with serious comorbidity and  body mass index (BMI) of 34.0 to 34.9 in adult  [place] obesity.      Return in about 6 months (around 04/29/2021).         I, [name], MD, have reviewed all documentation for this visit.  The documentation on 11/07/20 for the exam, diagnosis, procedures, and  orders are all accurate and complete.        Jlynn Langille Cranford Mon, MD   [facility]  [phone] (phone)  [phone] (fax)     [place]

## 2020-11-05 DIAGNOSIS — I10 Essential (primary) hypertension: Secondary | ICD-10-CM | POA: Diagnosis not present

## 2020-11-05 DIAGNOSIS — N184 Chronic kidney disease, stage 4 (severe): Secondary | ICD-10-CM | POA: Diagnosis not present

## 2020-11-11 ENCOUNTER — Telehealth: Payer: Self-pay

## 2020-11-11 NOTE — Progress Notes (Signed)
Chronic Care Management Pharmacy Assistant   Name: Ronald Bellin [facility]  MRN: [id number] DOB: 1945/06/25  Reason for [place] Call.   Recent office visits:  11/03/2020 Dr. [name] MD (PCP) start scralfate 1 g 3 times daily before meals & bedtime  Recent consult visits:  No recent [place] [facility] visits:  None in previous 6 months  Medications: Outpatient Encounter Medications as of 11/11/2020  Medication Sig   ACETAMINOPHEN PO Take 650 mg by mouth. 2 at bedtime   allopurinol (ZYLOPRIM) 100 MG tablet Take 1 tablet (100 mg total) by mouth at bedtime.   amiodarone (PACERONE) 200 MG tablet TAKE 1 TABLET BY MOUTH  DAILY (Patient not taking: Reported on 11/03/2020)   amLODipine (NORVASC) 10 MG tablet TAKE 1 TABLET BY MOUTH EVERY DAY   apixaban (ELIQUIS) 5 MG TABS tablet Take 1 tablet (5 mg total) by mouth 2 (two) times daily.   atorvastatin (LIPITOR) 40 MG tablet Take 1 tablet (40 mg total) by mouth daily.   Cholecalciferol (VITAMIN D-3) 125 MCG (5000 UT) TABS Take 2,000 Units by mouth daily.    enalapril (VASOTEC) 20 MG tablet Take 20 mg by mouth daily.   furosemide (LASIX) 40 MG tablet Take 40 mg by mouth daily as needed for fluid or edema.    isosorbide mononitrate (IMDUR) 30 MG 24 hr tablet TAKE 1 TABLET BY MOUTH  DAILY   levothyroxine (SYNTHROID) 75 MCG tablet TAKE 1 TABLET (75 MCG TOTAL) BY MOUTH DAILY BEFORE BREAKFAST.   loratadine (CLARITIN) 10 MG tablet Take 10 mg by mouth daily as needed for allergies.   magnesium oxide (MAG-OX) 400 (240 Mg) MG tablet TAKE 1 TABLET BY MOUTH TWICE A DAY   Melatonin 5 MG TABS Take 1 tablet by mouth at bedtime.   metoprolol tartrate (LOPRESSOR) 100 MG tablet TAKE 1 TABLET BY MOUTH  TWICE DAILY   Multiple Vitamin (MULTIVITAMIN PO) Take 1 tablet by mouth daily.   nitroGLYCERIN (NITROSTAT) 0.4 MG SL tablet Place 0.4 mg under the tongue every 5 (five) minutes as needed for chest pain (Up to 3 times).    pantoprazole  (PROTONIX) 40 MG tablet Take 2 tablets (80 mg total) by mouth daily.   polyethylene glycol (MIRALAX / GLYCOLAX) 17 g packet Take 17 g by mouth daily.   sodium bicarbonate 650 MG tablet Take 650 mg by mouth 2 (two) times daily. Evening   sucralfate (CARAFATE) 1 g tablet Take 1 tablet (1 g total) by mouth 4 (four) times daily -  before meals and at bedtime.   No facility-administered encounter medications on file as of 11/11/2020.    Care Gaps: Annual Wellness Visit  Star Rating Drugs: Atorvastatin 40 mg last filled on 09/01/2020 for 90 day supply at [facility].  Reviewed chart prior to disease state call. Spoke with patient regarding BP  Recent Office Vitals: BP Readings from Last 3 Encounters:  11/03/20 (!) 154/74  07/28/20 (!) 162/62  06/30/20 135/65   Pulse Readings from Last 3 Encounters:  11/03/20 (!) 58  07/28/20 (!) 55  06/30/20 (!) 52    Wt Readings from Last 3 Encounters:  11/03/20 248 lb (112.5 kg)  07/28/20 249 lb 12.8 oz (113.3 kg)  06/30/20 253 lb (114.8 kg)     Kidney Function Lab Results  Component Value Date/Time   CREATININE 2.46 (H) 03/20/2020 10:38 AM   CREATININE 2.67 (H) 10/10/2019 03:57 PM   CREATININE 2.44 (H) 11/24/2015 03:46 PM   CREATININE  2.17 (H) 10/20/2015 02:18 PM   GFRNONAA 25 (L) 03/20/2020 10:38 AM   GFRAA 28 (L) 03/20/2020 10:38 AM    BMP Latest Ref Rng & Units 03/20/2020 10/10/2019 05/01/2019  Glucose 65 - 99 mg/dL 124(H) 122(H) 130(H)  BUN 8 - 27 mg/dL 29(H) 28(H) 31(H)  Creatinine 0.76 - 1.27 mg/dL 2.46(H) 2.67(H) 2.59(H)  BUN/Creat Ratio 10 - 24 12 10 12   Sodium 134 - 144 mmol/L 145(H) 144 143  Potassium 3.5 - 5.2 mmol/L 4.8 5.3(H) 4.8  Chloride 96 - 106 mmol/L 110(H) 109(H) 108(H)  CO2 20 - 29 mmol/L 19(L) 22 22  Calcium 8.6 - 10.2 mg/dL 9.1 9.5 9.0    Current antihypertensive regimen:  Enalapril 20 mg one tablet daily Metoprolol 100 mg one tablet twice daily Amlodipine 10 mg one tablet daily  What recent  interventions/DTPs have been made by any provider to improve Blood Pressure control since last CPP Visit:  08/28/2020 Dr. Angelena Form MD (Cardiology)  Start Enalapril 20 mg Any recent hospitalizations or ED visits since last visit with CPP? No  I have attempted without success to contact this patient by phone three times to do his Hypertension Disease State call. I left a Voice message for patient to return my call. Left Voice Message on 06/14 , 06/15, and 06/20.  Adherence Review: Is the patient currently on ACE/ARB medication? Yes Does the patient have >5 day gap between last estimated fill dates? No  Patient is schedule for a telephone follow up with clinical pharmacist on 12/08/2020 at 3:00 pm . Patient is schedule for Annual Wellness visit on 04/29/2021.  [place] Pharmacist Assistant [phone]

## 2020-11-13 ENCOUNTER — Ambulatory Visit: Payer: Self-pay

## 2020-11-13 ENCOUNTER — Encounter: Payer: Self-pay | Admitting: Internal Medicine

## 2020-11-13 ENCOUNTER — Telehealth (INDEPENDENT_AMBULATORY_CARE_PROVIDER_SITE_OTHER): Payer: Medicare Other | Admitting: Internal Medicine

## 2020-11-13 DIAGNOSIS — U071 COVID-19: Secondary | ICD-10-CM | POA: Diagnosis not present

## 2020-11-13 MED ORDER — BENZONATATE 100 MG PO CAPS: 100.0000 mg | ORAL_CAPSULE | Freq: Two times a day (BID) | ORAL | 0 refills | Status: DC | PRN

## 2020-11-13 MED ORDER — AZITHROMYCIN 250 MG PO TABS: ORAL_TABLET | ORAL | 0 refills | Status: AC

## 2020-11-13 NOTE — Progress Notes (Signed)
There were no vitals taken for this visit.   Subjective:    Patient ID: Ronald [name], male    DOB: [birth year], 75 y.o.   MRN: [id number]  Chief Complaint  Patient presents with   Cough   Headache   Fatigue   Generalized Body Aches   Chills   Covid Positive    Tested positive today, last covid vacc was 08/25/19    HPI: Ronald [name] is a 75 y.o. male   This visit was completed via telephone due to the restrictions of the COVID-19 pandemic. All issues as above were discussed and addressed but no physical exam was performed. If it was felt that the patient should be evaluated in the office, they were directed there. The patient verbally consented to this visit. Patient was unable to complete an audio/visual visit due to Technical difficulties. Due to the catastrophic nature of the COVID-19 pandemic, this visit was done through audio contact only. Location of the patient: home Location of the provider: work Those involved with this call:  Provider: Charlynne Cousins, MD CMA: Frazier Butt, [place] Desk/Registration: Jill Side  Time spent on call: 10 minutes with patient ftelephone conference. More than 50% of this time was spent in counseling and coordination of care. 10 minutes total spent in review of patient's record and preparation of their chart.  Mucus extended release otc    Cough This is a new (coughing up phelgm, drained doesnt feel good, symptoms started x 2 days ago. tested today home COVID test +ve) problem. The current episode started yesterday (using mucus extended release otc). The problem has been gradually improving. Associated symptoms include chills, ear congestion, a fever, headaches, postnasal drip and wheezing. Pertinent negatives include no chest pain, heartburn, hemoptysis, myalgias, nasal congestion, rash, rhinorrhea, sore throat, shortness of breath, sweats or weight loss. Associated symptoms comments: 98 F temp.[name]  Headache  This is a new problem. The  current episode started in the past 7 days. Associated symptoms include coughing and a fever. Pertinent negatives include no rhinorrhea, sore throat or weight loss.   Chief Complaint  Patient presents with   Cough   Headache   Fatigue   Generalized Body Aches   Chills   Covid Positive    Tested positive today, last covid vacc was 08/25/19    Relevant past medical, surgical, family and social history reviewed and updated as indicated. Interim medical history since our last visit reviewed. Allergies and medications reviewed and updated.  Review of Systems  Constitutional:  Positive for chills and fever. Negative for weight loss.  HENT:  Positive for postnasal drip. Negative for rhinorrhea and sore throat.   Respiratory:  Positive for cough and wheezing. Negative for hemoptysis and shortness of breath.   Cardiovascular:  Negative for chest pain.  Gastrointestinal:  Negative for heartburn.  Musculoskeletal:  Negative for myalgias.  Skin:  Negative for rash.  Neurological:  Positive for headaches.   Per HPI unless specifically indicated above     Objective:    There were no vitals taken for this visit.  Wt Readings from Last 3 Encounters:  11/03/20 248 lb (112.5 kg)  07/28/20 249 lb 12.8 oz (113.3 kg)  06/30/20 253 lb (114.8 kg)    Physical Exam Vitals reviewed: unable to perfrom sec to virtula.    Results for orders placed or performed in visit on 11/03/20  POCT glycosylated hemoglobin (Hb A1C)  Result Value Ref Range   Hemoglobin A1C 5.8 (A) 4.0 -  5.6 %   Est. average glucose Bld gHb Est-mCnc 120         Current Outpatient Medications:    ACETAMINOPHEN PO, Take 650 mg by mouth. 2 at bedtime, Disp: , Rfl:    allopurinol (ZYLOPRIM) 100 MG tablet, Take 1 tablet (100 mg total) by mouth at bedtime., Disp: 90 tablet, Rfl: 3   amiodarone (PACERONE) 200 MG tablet, TAKE 1 TABLET BY MOUTH  DAILY, Disp: 90 tablet, Rfl: 3   amLODipine (NORVASC) 10 MG tablet, TAKE 1 TABLET BY  MOUTH EVERY DAY, Disp: 90 tablet, Rfl: 1   apixaban (ELIQUIS) 5 MG TABS tablet, Take 1 tablet (5 mg total) by mouth 2 (two) times daily., Disp: 180 tablet, Rfl: 3   atorvastatin (LIPITOR) 40 MG tablet, Take 1 tablet (40 mg total) by mouth daily., Disp: 90 tablet, Rfl: 2   Cholecalciferol (VITAMIN D-3) 125 MCG (5000 UT) TABS, Take 2,000 Units by mouth daily. , Disp: , Rfl:    enalapril (VASOTEC) 20 MG tablet, Take 20 mg by mouth daily., Disp: , Rfl:    furosemide (LASIX) 40 MG tablet, Take 40 mg by mouth daily as needed for fluid or edema. , Disp: , Rfl: 6   isosorbide mononitrate (IMDUR) 30 MG 24 hr tablet, TAKE 1 TABLET BY MOUTH  DAILY, Disp: 90 tablet, Rfl: 3   levothyroxine (SYNTHROID) 75 MCG tablet, TAKE 1 TABLET (75 MCG TOTAL) BY MOUTH DAILY BEFORE BREAKFAST., Disp: 90 tablet, Rfl: 2   loratadine (CLARITIN) 10 MG tablet, Take 10 mg by mouth daily as needed for allergies., Disp: , Rfl:    magnesium oxide (MAG-OX) 400 (240 Mg) MG tablet, TAKE 1 TABLET BY MOUTH TWICE A DAY, Disp: 180 tablet, Rfl: 0   Melatonin 5 MG TABS, Take 1 tablet by mouth at bedtime., Disp: , Rfl:    metoprolol tartrate (LOPRESSOR) 100 MG tablet, TAKE 1 TABLET BY MOUTH  TWICE DAILY, Disp: 180 tablet, Rfl: 3   Multiple Vitamin (MULTIVITAMIN PO), Take 1 tablet by mouth daily., Disp: , Rfl:    nitroGLYCERIN (NITROSTAT) 0.4 MG SL tablet, Place 0.4 mg under the tongue every 5 (five) minutes as needed for chest pain (Up to 3 times). , Disp: , Rfl:    pantoprazole (PROTONIX) 40 MG tablet, Take 2 tablets (80 mg total) by mouth daily., Disp: 30 tablet, Rfl: 0   polyethylene glycol (MIRALAX / GLYCOLAX) 17 g packet, Take 17 g by mouth daily., Disp: , Rfl:    sodium bicarbonate 650 MG tablet, Take 650 mg by mouth 2 (two) times daily. Evening, Disp: , Rfl:    sucralfate (CARAFATE) 1 g tablet, Take 1 tablet (1 g total) by mouth 4 (four) times daily -  before meals and at bedtime., Disp: 360 tablet, Rfl: 3    Assessment & Plan:   COVID  pt and wife refused anti virals including monupiravir sec to multiplte drug interactions with his cardiac meds, pt says he has mild symptoms and is afdviced to call back if worsens.  COVID : positive : start pt on zpak. Tessalon pearls sent for cough Increase fluid intake.  Headahce - tyelnol every 4-6 hrs prn and alternate this with ibubrufen 800 mg q 8 hrly. Sinus pressure: use steam inhalation.  OTC -  Allegra / claritin.  5 days quarantine.  Ok to rtw in 5 days if tests -ve follow    Advised to call the office or go to the ER if she develops chest pain , any new  onset / worsening shortness of breath dizziness or chest symptoms/ worsening fevers/ .  Pt verbalized understanding of such.    Problem List Items Addressed This Visit   None    No orders of the defined types were placed in this encounter.    Meds ordered this encounter  Medications   azithromycin (ZITHROMAX) 250 MG tablet    Sig: Take 2 tablets on day 1, then 1 tablet daily on days 2 through 5    Dispense:  6 tablet    Refill:  0   benzonatate (TESSALON) 100 MG capsule    Sig: Take 1 capsule (100 mg total) by mouth 2 (two) times daily as needed for cough.    Dispense:  20 capsule    Refill:  0     Follow up plan: No follow-ups on file.

## 2020-11-13 NOTE — Telephone Encounter (Signed)
Patient's wife called and says he tested positive for COVID this morning using a home test. She says their son who lives in the home tested positive on Monday or Tuesday. She says the patient's symptoms started on Tuesday with sneezing, now coughing, headache, and sore throat. He had chills yesterday. Temp today 98.4. She says he has not run a temperature this week, but she's not sure if the thermometer was accurate. He says the cough is the worst symptom. He says the cough is productive with phlegm, but doesn't look at the color. Denies chest pain, denies SOB, denies other symptoms. Advised he will need a virtual visit. No availability with PCP or any provider in the office. Called the office and spoke to [place], [facility] about the options. She says to offer them e-visit, UC, or MyChart video visit with a provider a [facility]. I advised the wife, she agrees to an appointment at [facility]. Elena scheduled for today at 1440 with Dr. Neomia Dear. Patient's wife advised of the appointment, care advice given, advised how to connect on MyChart for visit, she verbalized understanding.   Reason for Disposition  [1] Continuous (nonstop) coughing interferes with work or school AND [2] no improvement using cough treatment per Care Advice  Answer Assessment - Initial Assessment Questions 1. COVID-19 DIAGNOSIS: "Who made your COVID-19 diagnosis?" "Was it confirmed by a positive lab test or self-test?" If not diagnosed by a doctor (or NP/PA), ask "Are there lots of cases (community spread) where you live?" Note: See public health department website, if unsure.     Home test this morning positive 2. COVID-19 EXPOSURE: "Was there any known exposure to COVID before the symptoms began?" CDC Definition of close contact: within 6 feet (2 meters) for a total of 15 minutes or more over a 24-hour period.      Yes, son who lives in the home on Monday or Tuesday tested positive 3. ONSET: "When did the COVID-19 symptoms start?"      Tuesday-sneezing 4.  WORST SYMPTOM: "What is your worst symptom?" (e.g., cough, fever, shortness of breath, muscle aches)     Cough-productive with phlegm 5. COUGH: "Do you have a cough?" If Yes, ask: "How bad is the cough?"       Yes 6. FEVER: "Do you have a fever?" If Yes, ask: "What is your temperature, how was it measured, and when did it start?"     98.4  7. RESPIRATORY STATUS: "Describe your breathing?" (e.g., shortness of breath, wheezing, unable to speak)      No 8. BETTER-SAME-WORSE: "Are you getting better, staying the same or getting worse compared to yesterday?"  If getting worse, ask, "In what way?"     Same 9. HIGH RISK DISEASE: "Do you have any chronic medical problems?" (e.g., asthma, heart or lung disease, weak immune system, obesity, etc.)     Yes-heart disease 10. VACCINE: "Have you had the COVID-19 vaccine?" If Yes, ask: "Which one, how many shots, when did you get it?"       Yes, first 2 shots of Moderna 11. BOOSTER: "Have you received your COVID-19 booster?" If Yes, ask: "Which one and when did you get it?"       No 12. PREGNANCY: "Is there any chance you are pregnant?" "When was your last menstrual period?"       N/A 13. OTHER SYMPTOMS: "Do you have any other symptoms?"  (e.g., chills, fatigue, headache, loss of smell or taste, muscle pain, sore throat)  Headache, sore throat, chills yesterday, fatigue, cough, sneeze 14. O2 SATURATION MONITOR:  "Do you use an oxygen saturation monitor (pulse oximeter) at home?" If Yes, ask "What is your reading (oxygen level) today?" "What is your usual oxygen saturation reading?" (e.g., 95%)       No  Protocols used: Coronavirus (COVID-19) Diagnosed or Suspected-[facility]

## 2020-11-13 NOTE — Telephone Encounter (Signed)
FYI virtual scheduled today

## 2020-11-17 ENCOUNTER — Other Ambulatory Visit: Payer: Self-pay | Admitting: Family Medicine

## 2020-11-19 ENCOUNTER — Ambulatory Visit: Payer: Self-pay

## 2020-11-19 MED ORDER — DOXYCYCLINE HYCLATE 100 MG PO TABS: 100.0000 mg | ORAL_TABLET | Freq: Two times a day (BID) | ORAL | 0 refills | Status: DC

## 2020-11-19 NOTE — Telephone Encounter (Signed)
Wife reports pt. Diagnosed with COVID 19 11/13/20. Still has a productive cough with white phlegm. Also has a headache. Other symptoms have subsided. Tessalon "is not helping the cough. It is worse at night." Requesting something for cough be sent to pharmacy.Please advise.

## 2020-11-19 NOTE — Telephone Encounter (Signed)
Advised patient's wife. Patient can not take prednisone.. Willing to try just the abx  only. Sent med into the pharmacy.

## 2020-11-19 NOTE — Telephone Encounter (Signed)
Rx sent to pharmacy   

## 2020-11-19 NOTE — Addendum Note (Signed)
Addended by: Wilburt Finlay on: 11/19/2020 03:59 PM   Modules accepted: Orders

## 2020-11-19 NOTE — Telephone Encounter (Signed)
Pt wife [name] is calling and her husband was dx with covid and had virtual appt on 11-13-2020 and she would like to know what he can try OTC for cough and chest congestion  Answer Assessment - Initial Assessment Questions 1. COVID-19 ONSET: "When did the symptoms of COVID-19 first start?"     11/13/20 2. DIAGNOSIS CONFIRMATION: "How were you diagnosed?" (e.g., COVID-19 oral or nasal viral test; COVID-19 antibody test; doctor visit)     Home test 3. MAIN SYMPTOM:  "What is your main concern or symptom right now?" (e.g., breathing difficulty, cough, fatigue. loss of smell)     Cough 4. SYMPTOM ONSET: "When did the cough start?"     11/13/20 5. BETTER-SAME-WORSE: "Are you getting better, staying the same, or getting worse over the last 1 to 2 weeks?"     Same 6. RECENT MEDICAL VISIT: "Have you been seen by a healthcare provider (doctor, NP, PA) for these persisting COVID-19 symptoms?" If Yes, ask: "When were you seen?" (e.g., date)     Video visit 11/13/20 7. COUGH: "Do you have a cough?" If Yes, ask: "How bad is the cough?"       Yes 8. FEVER: "Do you have a fever?" If Yes, ask: "What is your temperature, how was it measured, and when did it start?"     No 9. BREATHING DIFFICULTY: "Are you having any trouble breathing?" If Yes, ask: "How bad is your breathing?" (e.g., mild, moderate, severe)    - MILD: No SOB at rest, mild SOB with walking, speaks normally in sentences, can lie down, no retractions, pulse < 100.    - MODERATE: SOB at rest, SOB with minimal exertion and prefers to sit, cannot lie down flat, speaks in phrases, mild retractions, audible wheezing, pulse 100-120.    - SEVERE: Very SOB at rest, speaks in single words, struggling to breathe, sitting hunched forward, retractions, pulse > 120       No 10. HIGH RISK DISEASE: "Do you have any chronic medical problems?" (e.g., asthma, heart or lung disease, weak immune system, obesity, etc.)       Heart 11. VACCINE: "Have you gotten the  COVID-19 vaccine?" If Yes, ask: "Which one, how many shots, when did you get it?"       Yes 12. BOOSTER: "Have you received your COVID-19 booster?" If Yes, ask: "Which one and when did you get it?"       No 13. PREGNANCY: "Is there any chance you are pregnant?" "When was your last menstrual period?"       N/a 14. OTHER SYMPTOMS: "Do you have any other symptoms?"  (e.g., fatigue, headache, muscle pain, weakness)       Headache 15. O2 SATURATION MONITOR:  "Do you use an oxygen saturation monitor (pulse oximeter) at home?" If Yes, ask "What is your reading (oxygen level) today?" "What is your usual oxygen saturation reading?" (e.g., 95%)       No  Protocols used: Coronavirus (COVID-19) Persisting Symptoms Follow-up Call-[facility]

## 2020-11-19 NOTE — Addendum Note (Signed)
Addended by: Julieta Bellini on: 11/19/2020 04:09 PM   Modules accepted: Orders

## 2020-12-02 ENCOUNTER — Telehealth: Payer: Self-pay

## 2020-12-02 NOTE — Telephone Encounter (Signed)
Called and spoke with wife to see what concern is and she requested appointment for patient. Patients wife states that patient has been complaining of left rib pain for the past 3 weeks, he denies any injury, heavy lifting or strenuous activity. Patient denies  cough, dyspnea or dyspnea on exertion. Scheduled appt for patient to be evaluated by you on 01/09/20, they declined to see anyone else in office but you. KW

## 2020-12-02 NOTE — Telephone Encounter (Signed)
Copied from [place] [id number]. Topic: Appointment Scheduling - Scheduling Inquiry for Clinic >> Dec 02, 2020  2:05 PM Erick Blinks wrote: Reason for CRM:   Pt's wife called requesting to speak to office about an appt. Pt has left side pain. Wants to be seen/contacted as soon as possible. Declined next available.

## 2020-12-05 ENCOUNTER — Telehealth: Payer: Self-pay

## 2020-12-05 NOTE — Progress Notes (Signed)
Left Voice Message to confirmed patient telephone appointment on 12/08/2020 for CCM at 3:00 pm with Junius Argyle the Clinical pharmacist. Left message to have all medications, supplements, blood pressure and blood sugar logs available during appointment and to return call if need to reschedule.  Star Rating Drug: Atorvastatin 40 mg last filled on 11/28/2020 for 90 day supply at [facility].  Any gaps in medications fill history? [place] Pharmacist Assistant [phone]

## 2020-12-08 ENCOUNTER — Telehealth (INDEPENDENT_AMBULATORY_CARE_PROVIDER_SITE_OTHER): Payer: Medicare Other | Admitting: Family Medicine

## 2020-12-08 ENCOUNTER — Other Ambulatory Visit: Payer: Self-pay

## 2020-12-08 ENCOUNTER — Encounter: Payer: Self-pay | Admitting: Family Medicine

## 2020-12-08 ENCOUNTER — Ambulatory Visit (INDEPENDENT_AMBULATORY_CARE_PROVIDER_SITE_OTHER): Payer: Medicare Other

## 2020-12-08 VITALS — BP 133/78 | HR 51 | Temp 98.2°F | Resp 16 | Ht 69.0 in | Wt 247.0 lb

## 2020-12-08 DIAGNOSIS — I70213 Atherosclerosis of native arteries of extremities with intermittent claudication, bilateral legs: Secondary | ICD-10-CM

## 2020-12-08 DIAGNOSIS — I420 Dilated cardiomyopathy: Secondary | ICD-10-CM | POA: Diagnosis not present

## 2020-12-08 DIAGNOSIS — E6609 Other obesity due to excess calories: Secondary | ICD-10-CM

## 2020-12-08 DIAGNOSIS — I10 Essential (primary) hypertension: Secondary | ICD-10-CM

## 2020-12-08 DIAGNOSIS — K219 Gastro-esophageal reflux disease without esophagitis: Secondary | ICD-10-CM

## 2020-12-08 DIAGNOSIS — I480 Paroxysmal atrial fibrillation: Secondary | ICD-10-CM | POA: Diagnosis not present

## 2020-12-08 DIAGNOSIS — S2341XA Sprain of ribs, initial encounter: Secondary | ICD-10-CM | POA: Diagnosis not present

## 2020-12-08 DIAGNOSIS — I714 Abdominal aortic aneurysm, without rupture, unspecified: Secondary | ICD-10-CM

## 2020-12-08 DIAGNOSIS — Z951 Presence of aortocoronary bypass graft: Secondary | ICD-10-CM | POA: Diagnosis not present

## 2020-12-08 DIAGNOSIS — N184 Chronic kidney disease, stage 4 (severe): Secondary | ICD-10-CM

## 2020-12-08 DIAGNOSIS — J309 Allergic rhinitis, unspecified: Secondary | ICD-10-CM

## 2020-12-08 DIAGNOSIS — Z6834 Body mass index (BMI) 34.0-34.9, adult: Secondary | ICD-10-CM

## 2020-12-08 NOTE — Progress Notes (Signed)
Chronic Care Management Pharmacy Note  12/11/2020 Name:  Ronald [name] [facility] MRN:  [id number] DOB:  [birth year]  Summary: Today's visit was conducted with patient's spouse Ronald [name] for CCM follow-up. She reports patient has been doing well with his medications. Denies affordability concerns at the moment with Eliquis.   Recommendations/Changes made from today's visit: Continue current medications  Plan: CPP follow-up in 6 months.    Subjective: TELL [name] is an 75 y.o. year old male who is a primary patient of [name]., MD.  The CCM team was consulted for assistance with disease management and care coordination needs.    Engaged with patient by telephone for follow up visit in response to provider referral for pharmacy case management and/or care coordination services.   Consent to Services:  The patient was given information about Chronic Care Management services, agreed to services, and gave verbal consent prior to initiation of services.  Please see initial visit note for detailed documentation.   Patient Care Team: [name]., MD as PCP - General (Unknown Physician Specialty) Burnell Blanks, MD as PCP - Cardiology (Cardiology) Murlean Iba, MD as Consulting Physician (Internal Medicine) Eulogio Bear, MD as Consulting Physician (Ophthalmology) Margaretha Sheffield, MD (Otolaryngology) Germaine Pomfret, [facility] (Pharmacist)  Recent office visits: 11/03/20: Patient presented to Dr. [name] for follow-up.   Recent consult visits: 08/27/20: Patient presented to Dr. Merita Norton (Nephrology) for follow-up.   Hospital visits: None in previous 6 months   Objective:  Lab Results  Component Value Date   CREATININE 2.46 (H) 03/20/2020   BUN 29 (H) 03/20/2020   GFRNONAA 25 (L) 03/20/2020   GFRAA 28 (L) 03/20/2020   NA 145 (H) 03/20/2020   K 4.8 03/20/2020   CALCIUM 9.1 03/20/2020   CO2 19 (L) 03/20/2020   GLUCOSE 124 (H) 03/20/2020    Lab Results   Component Value Date/Time   HGBA1C 5.8 (A) 11/03/2020 02:40 PM   HGBA1C 5.8 (A) 06/30/2020 01:46 PM   HGBA1C 5.9 (H) 03/20/2020 10:38 AM   HGBA1C 5.9 (H) 08/01/2019 02:27 PM   HGBA1C 6.2 09/26/2015 12:00 AM   MICROALBUR 50 04/30/2019 03:14 PM   MICROALBUR 100 01/11/2017 04:43 PM    Last diabetic Eye exam:  Lab Results  Component Value Date/Time   HMDIABEYEEXA No Retinopathy 06/15/2019 12:00 AM    Last diabetic Foot exam: No results found for: HMDIABFOOTEX   Lab Results  Component Value Date   CHOL 147 03/20/2020   HDL 52 03/20/2020   LDLCALC 80 03/20/2020   TRIG 80 03/20/2020   CHOLHDL 2.8 03/20/2020    Hepatic Function Latest Ref Rng & Units 03/20/2020 10/10/2019 05/01/2019  Total Protein 6.0 - 8.5 g/dL 5.8(L) - 6.1  Albumin 3.7 - 4.7 g/dL 4.1 4.2 4.3  AST 0 - 40 IU/L 21 - 32  ALT 0 - 44 IU/L 24 - 35  Alk Phosphatase 44 - 121 IU/L 163(H) - 178(H)  Total Bilirubin 0.0 - 1.2 mg/dL 0.8 - 0.7  Bilirubin, Direct 0.0 - 0.3 mg/dL - - -    Lab Results  Component Value Date/Time   TSH 5.330 (H) 03/20/2020 10:38 AM   TSH 8.940 (H) 10/10/2019 03:57 PM    CBC Latest Ref Rng & Units 03/20/2020 05/01/2019 10/31/2018  WBC 3.4 - 10.8 x10E3/uL 5.7 6.5 7.8  Hemoglobin 13.0 - 17.7 g/dL 11.9(L) 12.6(L) 13.7  Hematocrit 37.5 - 51.0 % 37.8 38.8 40.4  Platelets 150 - 450 x10E3/uL 128(L) 147(L) 167  No results found for: VD25OH  Clinical ASCVD: Yes  The ASCVD Risk score Mikey Bussing DC Jr., et al., 2013) failed to calculate for the following reasons:   The patient has a prior MI or stroke diagnosis    Depression screen [facility] 2/9 11/03/2020 04/28/2020 03/19/2020  Decreased Interest 0 0 0  Down, Depressed, Hopeless 0 0 0  PHQ - 2 Score 0 0 0  Altered sleeping 3 - 3  Tired, decreased energy 3 - 3  Change in appetite 0 - 0  Feeling bad or failure about yourself  0 - 3  Trouble concentrating 0 - 0  Moving slowly or fidgety/restless 0 - 0  Suicidal thoughts 0 - 0  PHQ-9 Score 6 - 9   Difficult doing work/chores Somewhat difficult - Extremely dIfficult  Some recent data might be hidden    Social History   Tobacco Use  Smoking Status Former   Packs/day: 3.00   Years: 20.00   Pack years: 60.00   Types: Cigarettes   Quit date: 07/25/1986   Years since quitting: 34.4  Smokeless Tobacco Never   BP Readings from Last 3 Encounters:  12/08/20 133/78  11/03/20 (!) 154/74  07/28/20 (!) 162/62   Pulse Readings from Last 3 Encounters:  12/08/20 (!) 51  11/03/20 (!) 58  07/28/20 (!) 55   Wt Readings from Last 3 Encounters:  12/08/20 247 lb (112 kg)  11/03/20 248 lb (112.5 kg)  07/28/20 249 lb 12.8 oz (113.3 kg)   BMI Readings from Last 3 Encounters:  12/08/20 36.48 kg/m  11/03/20 36.62 kg/m  07/28/20 36.89 kg/m    Assessment/Interventions: Review of patient past medical history, allergies, medications, health status, including review of consultants reports, laboratory and other test data, was performed as part of comprehensive evaluation and provision of chronic care management services.   SDOH:  (Social Determinants of Health) assessments and interventions performed: Yes SDOH Interventions    Flowsheet Row Most Recent Value  SDOH Interventions   Financial Strain Interventions Intervention Not Indicated      SDOH Screenings   Alcohol Screen: Low Risk    Last Alcohol Screening Score (AUDIT): 0  Depression (PHQ2-9): Medium Risk   PHQ-2 Score: 6  Financial Resource Strain: Low Risk    Difficulty of Paying Living Expenses: Not hard at all  Food Insecurity: No Food Insecurity   Worried About Charity fundraiser in the Last Year: Never true   Ran Out of Food in the Last Year: Never true  Housing: Low Risk    Last Housing Risk Score: 0  Physical Activity: Inactive   Days of Exercise per Week: 0 days   Minutes of Exercise per Session: 0 min  Social Connections: Moderately Isolated   Frequency of Communication with Friends and Family: Once a week    Frequency of Social Gatherings with Friends and Family: More than three times a week   Attends Religious Services: Never   Marine scientist or Organizations: No   Attends Music therapist: Never   Marital Status: Married  Stress: No Stress Concern Present   Feeling of Stress : Not at all  Tobacco Use: Medium Risk   Smoking Tobacco Use: Former   Smokeless Tobacco Use: Never  Transportation Needs: No Data processing manager (Medical): No   Lack of Transportation (Non-Medical): No    CCM Care Plan  Allergies  Allergen Reactions   Predicort [Prednisolone] Other (See Comments)    Stomach pain  Ciprofloxacin     GI upset   Hydrochlorothiazide Other (See Comments)    Dehydration   Hydrocodone     Stomach upset   Hydrocodone-Acetaminophen     Stomach upset   Hydrocodone-Acetaminophen Nausea Only   Loratadine Other (See Comments)    Other reaction(s): Unknown   Sulfa Antibiotics Other (See Comments)    Cannot recall   Sulfacetamide Sodium Other (See Comments)    Cannot recall   Sulfasalazine     Other reaction(s): Other (See Comments) Cannot recall   Hydrocodone-Acetaminophen Nausea Only   Penicillins Hives and Rash    Has patient had a PCN reaction causing immediate rash, facial/tongue/throat swelling, SOB or lightheadedness with hypotension: YES Has patient had a PCN reaction causing severe rash involving mucus membranes or skin necrosis: NO Has patient had a PCN reaction that required hospitalization NO Has patient had a PCN reaction occurring within the last 10 years:NO If all of the above answers are "NO", then may proceed with Cephalosporin use.    Medications Reviewed Today     Reviewed by Germaine Pomfret, [facility] (Pharmacist) on 12/08/20 at 1520  Med List Status: <None>   Medication Order Taking? Sig Documenting Provider Last Dose Status Informant  ACETAMINOPHEN PO [id number] No Take 650 mg by mouth. 2 at bedtime [provider] Taking Active   allopurinol (ZYLOPRIM) 100 MG tablet [id number] No Take 1 tablet (100 mg total) by mouth at bedtime. [name]., MD Taking Active   amiodarone (PACERONE) 200 MG tablet [id number] No TAKE 1 TABLET BY MOUTH  DAILY Burnell Blanks, MD Taking Active   amLODipine (NORVASC) 10 MG tablet [id number] No TAKE 1 TABLET BY MOUTH EVERY DAY [name]., MD Taking Active   apixaban (ELIQUIS) 5 MG TABS tablet [id number] No Take 1 tablet (5 mg total) by mouth 2 (two) times daily. Burnell Blanks, MD Taking Active   atorvastatin (LIPITOR) 40 MG tablet [id number] No Take 1 tablet (40 mg total) by mouth daily. [name]., MD Taking Active   benzonatate [facility]) 100 MG capsule [id number] No Take 1 capsule (100 mg total) by mouth 2 (two) times daily as needed for cough.  Patient not taking: Reported on 12/08/2020   Charlynne Cousins, MD Not Taking Active   Cholecalciferol (VITAMIN D-3) 125 MCG (5000 UT) TABS [id number] No Take 2,000 Units by mouth daily.  [provider] Taking Active Self  doxycycline (VIBRA-TABS) 100 MG tablet [id number] No Take 1 tablet (100 mg total) by mouth 2 (two) times daily. [name]., MD Taking Active   enalapril (VASOTEC) 20 MG tablet [id number] No Take 20 mg by mouth daily. [provider] Taking Active   furosemide (LASIX) 40 MG tablet [id number] No Take 40 mg by mouth daily as needed for fluid or edema.  [provider] Taking Active Spouse/Significant Other  isosorbide mononitrate (IMDUR) 30 MG 24 hr tablet [id number] No TAKE 1 TABLET BY MOUTH  DAILY Burnell Blanks, MD Taking Active   levothyroxine (SYNTHROID) 75 MCG tablet [id number] No TAKE 1 TABLET (75 MCG TOTAL) BY MOUTH DAILY BEFORE BREAKFAST. [name]., MD Taking Active   loratadine (CLARITIN) 10 MG tablet [id number] No Take 10 mg by mouth daily as needed for allergies. [provider] Taking Active  Spouse/Significant Other  magnesium oxide (MAG-OX) 400 (240 Mg) MG tablet [id number] No TAKE 1 TABLET BY MOUTH TWICE A DAY [name]., MD Taking Active  Melatonin 5 MG TABS [id number] No Take 1 tablet by mouth at bedtime. [provider] Taking Active Spouse/Significant Other  metoprolol tartrate (LOPRESSOR) 100 MG tablet [id number] No TAKE 1 TABLET BY MOUTH  TWICE DAILY Burnell Blanks, MD Taking Active   Multiple Vitamin (MULTIVITAMIN PO) [id number] No Take 1 tablet by mouth daily. [provider] Taking Active Spouse/Significant Other  nitroGLYCERIN (NITROSTAT) 0.4 MG SL tablet [id number] No Place 0.4 mg under the tongue every 5 (five) minutes as needed for chest pain (Up to 3 times).  [provider] Taking Active Spouse/Significant Other  pantoprazole (PROTONIX) 40 MG tablet [id number] No Take 2 tablets (80 mg total) by mouth daily. [name]., MD Taking Active   polyethylene glycol [facility] / GLYCOLAX) 17 g packet [id number] No Take 17 g by mouth daily. [provider] Taking Active   sodium bicarbonate 650 MG tablet [id number] No Take 650 mg by mouth 2 (two) times daily. Evening [provider] Taking Active Spouse/Significant Other  Discontinued 12/08/20 1520 (Patient Preference)             Patient Active Problem List   Diagnosis Date Noted   Acidosis 01/16/2020   Hyperkalemia 01/16/2020   Localized edema 01/16/2020   Hyperparathyroidism due to renal insufficiency ([place]) 05/18/2019   Bursitis of hip 12/18/2018   Pain in lower limb 12/18/2018   A-fib ([place]) 06/10/2018   CKD (chronic kidney disease), stage IV ([place]) 07/29/2017   Obesity 07/29/2017   Unstable angina ([place]) 07/27/2017   Atherosclerosis of native arteries of extremity with intermittent claudication ([place]) 06/25/2016   Acute renal failure superimposed on stage 3 chronic kidney disease ([place]) 10/07/2015   Dilated cardiomyopathy ([place]) 10/07/2015    Coronary artery disease involving native coronary artery of native heart with unstable angina pectoris (HCC)    Allergic rhinitis 08/19/2015   NSTEMI (non-ST elevated myocardial infarction) ([place]) 12/13/2014   AA (aortic aneurysm) ([place]) 10/04/2014   Arteriosclerosis of coronary artery 10/04/2014   Diabetes mellitus, type 2 ([place]) 10/04/2014   Acid reflux 10/04/2014   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   HLD (hyperlipidemia) 10/04/2014   BP (high blood pressure) 10/04/2014   Osteoarthrosis 10/04/2014   Adult BMI 30+ 10/04/2014   Basal cell papilloma 10/04/2014   Hypertensive heart disease without CHF 12/30/2012   Genital candidiasis in male 10/25/2012   Urge incontinence of urine 10/24/2012   Benign prostatic hyperplasia with urinary obstruction 10/24/2012   Incomplete emptying of bladder 10/24/2012   Other obstructive and reflux uropathy 10/24/2012   Abdominal pain 11/09/2010   Shortness of breath 11/12/2009   Renal artery stenosis     Mixed hyperlipidemia 09/04/2008   History of redo bypass grafting 09/04/2008   Diabetes mellitus with nephropathy ([place])     Immunization History  Administered Date(s) Administered   Fluad Quad(high Dose 65+) 03/01/2019, 03/19/2020   Influenza, High Dose Seasonal PF 03/18/2015, 03/02/2016, 02/13/2018   Influenza-Unspecified 03/14/2017   Moderna Sars-Covid-2 Vaccination 07/28/2019, 08/25/2019   Pneumococcal Conjugate-13 12/25/2013   Pneumococcal Polysaccharide-23 03/04/2009   Td 10/10/2003, 04/16/2016    Conditions to be addressed/monitored:  Hypertension, Atrial Fibrillation, Coronary Artery Disease, GERD, Chronic Kidney Disease, and Allergic Rhinitis  Care Plan : General Pharmacy (Adult)  Updates made by Germaine Pomfret, RPH since 12/11/2020 12:00 AM     Problem: Hypertension, Atrial Fibrillation, Coronary Artery Disease, GERD, Chronic Kidney Disease, and Allergic Rhinitis   Priority: High     Long-Range Goal: Patient-Specific Goal   Start  Date: 12/12/2020  Expected End Date: 06/13/2021  This Visit's Progress: On track  Priority: High  Note:   Current Barriers:  Unable to independently monitor therapeutic efficacy  Pharmacist Clinical Goal(s):  Patient will maintain control of blood pressure as evidenced by BP less than 140/90  through collaboration with PharmD and provider.   Interventions: 1:1 collaboration with [name]., MD regarding development and update of comprehensive plan of care as evidenced by provider attestation and co-signature Inter-disciplinary care team collaboration (see longitudinal plan of care) Comprehensive medication review performed; medication list updated in electronic medical record  Hypertension (BP goal <140/90) -Controlled -Current treatment: Amlodipine 10 mg daily  Enalapril 20 mg daily  Furosemide 40 mg daily as needed  Imdur 30 mg daily  Metoprolol 100 mg twice daily  -Medications previously tried: NA  -Current home readings: NA -Denies hypotensive/hypertensive symptoms -Educated on Daily salt intake goal < 2300 mg; -Counseled to monitor BP at home 2-3 times weekly, document, and provide log at future appointments -Recommended to continue current medication  Atrial Fibrillation (Goal: prevent stroke and major bleeding) -Controlled -CHADSVASC: 3 -Current treatment: Rate control: Amiodarone 200 mg daily  Metoprolol tartrate 100 mg twice daily  Anticoagulation: Eliquis 5 mg twice daily  -Medications previously tried: NA -Counseled on avoidance of NSAIDs due to increased bleeding risk with anticoagulants; -Recommended to continue current medication  Hyperlipidemia: (LDL goal < 70) -Controlled -History of NSTEMI -Current treatment: Atorvastatin 40 mg daily  -Medications previously tried: NA  -Educated on Strategies to manage statin-induced myalgias; -Recommended to continue current medication  Hypothyroidism (Goal: Maintain stable thyroid  function) -Controlled -Current treatment  Levothyroxine 75 mcg daily before breakfast  -Medications previously tried: NA  -Counseled on diet and exercise extensively  Chronic Kidney Disease Stage 4  -All medications assessed for renal dosing and appropriateness in chronic kidney disease. -Recommended to continue current medication   Patient Goals/Self-Care Activities Patient will:  - check blood pressure 2-3 times weekly, document, and provide at future appointments  Follow Up Plan: Telephone follow up appointment with care management team member scheduled for:  06/08/2021 at 3:00 PM      Medication Assistance: None required.  Patient affirms current coverage meets needs.  Compliance/Adherence/Medication fill history: Care Gaps: Covid Booster Ophthalmology Exam   Star-Rating Drugs: Atorvastatin 40 mg last filled on 11/28/2020 for 90 day supply at [facility].  Patient's preferred pharmacy [facility]  [id number] [place] [id number]: [phone]: [phone] [facility]  [place] [address] [address]Phone: [phone]: [phone] Uses pill box? Yes Pt endorses 100% compliance  We discussed: Current pharmacy is preferred with insurance plan and patient is satisfied with pharmacy services Patient decided to: Continue current medication management strategy  Care Plan and Follow Up Patient Decision:  Patient agrees to Care Plan and Follow-up.  Plan: Telephone follow up appointment with care management team member scheduled for:  06/08/2021 at 3:00 PM  AJunius Argyle PharmD, [phone]

## 2020-12-08 NOTE — Progress Notes (Signed)
Established patient visit   Patient: Ronald [name]   DOB: 20-Jun-1945   75 y.o. Male  MRN: [id number] Visit Date: 12/08/2020  Today's healthcare provider: [name], MD   Chief Complaint  Patient presents with   pain in rib area    Subjective    HPI  Patient presents today c/o rib pain X 3 weeks. Patient reports that it radiates up to his upper left side. He denies any trouble with urination or bowels. He denies any injuries.  No other symptoms. All problems are stable.  Medications unchanged.  Patient has no new breathing difficulties and no chest pain fever chills or cough.  No urologic symptoms. Patient also mentions that he had COVID a month ago and has lost the sense of taste.     Medications: Outpatient Medications Prior to Visit  Medication Sig   ACETAMINOPHEN PO Take 650 mg by mouth. 2 at bedtime   allopurinol (ZYLOPRIM) 100 MG tablet Take 1 tablet (100 mg total) by mouth at bedtime.   amiodarone (PACERONE) 200 MG tablet TAKE 1 TABLET BY MOUTH  DAILY   amLODipine (NORVASC) 10 MG tablet TAKE 1 TABLET BY MOUTH EVERY DAY   apixaban (ELIQUIS) 5 MG TABS tablet Take 1 tablet (5 mg total) by mouth 2 (two) times daily.   atorvastatin (LIPITOR) 40 MG tablet Take 1 tablet (40 mg total) by mouth daily.   Cholecalciferol (VITAMIN D-3) 125 MCG (5000 UT) TABS Take 2,000 Units by mouth daily.    doxycycline (VIBRA-TABS) 100 MG tablet Take 1 tablet (100 mg total) by mouth 2 (two) times daily.   enalapril (VASOTEC) 20 MG tablet Take 20 mg by mouth daily.   furosemide (LASIX) 40 MG tablet Take 40 mg by mouth daily as needed for fluid or edema.    isosorbide mononitrate (IMDUR) 30 MG 24 hr tablet TAKE 1 TABLET BY MOUTH  DAILY   levothyroxine (SYNTHROID) 75 MCG tablet TAKE 1 TABLET (75 MCG TOTAL) BY MOUTH DAILY BEFORE BREAKFAST.   loratadine (CLARITIN) 10 MG tablet Take 10 mg by mouth daily as needed for allergies.   magnesium oxide (MAG-OX) 400 (240 Mg) MG tablet TAKE 1  TABLET BY MOUTH TWICE A DAY   Melatonin 5 MG TABS Take 1 tablet by mouth at bedtime.   metoprolol tartrate (LOPRESSOR) 100 MG tablet TAKE 1 TABLET BY MOUTH  TWICE DAILY   Multiple Vitamin (MULTIVITAMIN PO) Take 1 tablet by mouth daily.   nitroGLYCERIN (NITROSTAT) 0.4 MG SL tablet Place 0.4 mg under the tongue every 5 (five) minutes as needed for chest pain (Up to 3 times).    pantoprazole (PROTONIX) 40 MG tablet Take 2 tablets (80 mg total) by mouth daily.   polyethylene glycol (MIRALAX / GLYCOLAX) 17 g packet Take 17 g by mouth daily.   sodium bicarbonate 650 MG tablet Take 650 mg by mouth 2 (two) times daily. Evening   sucralfate (CARAFATE) 1 g tablet Take 1 tablet (1 g total) by mouth 4 (four) times daily -  before meals and at bedtime.   benzonatate (TESSALON) 100 MG capsule Take 1 capsule (100 mg total) by mouth 2 (two) times daily as needed for cough. (Patient not taking: Reported on 12/08/2020)   No facility-administered medications prior to visit.    Review of Systems      Objective    BP 133/78   Pulse (!) 51   Temp 98.2 F (36.8 C)   Resp 16  Ht 5\' 9"  (1.753 m)   Wt 247 lb (112 kg)   BMI 36.48 kg/m  BP Readings from Last 3 Encounters:  12/08/20 133/78  11/03/20 (!) 154/74  07/28/20 (!) 162/62   Wt Readings from Last 3 Encounters:  12/08/20 247 lb (112 kg)  11/03/20 248 lb (112.5 kg)  07/28/20 249 lb 12.8 oz (113.3 kg)       Physical Exam Vitals reviewed.  Constitutional:      Appearance: Normal appearance.  HENT:     Head: Normocephalic and atraumatic.     Right Ear: External ear normal.     Left Ear: External ear normal.  Eyes:     General: No scleral icterus.    Conjunctiva/sclera: Conjunctivae normal.  Cardiovascular:     Rate and Rhythm: Normal rate and regular rhythm.     Pulses: Normal pulses.     Heart sounds: Normal heart sounds.  Pulmonary:     Effort: Pulmonary effort is normal.     Breath sounds: Normal breath sounds.  Abdominal:      Palpations: Abdomen is soft.     Comments: No CVAT.  Musculoskeletal:     Right lower leg: No edema.     Left lower leg: No edema.  Skin:    General: Skin is warm and dry.  Neurological:     General: No focal deficit present.     Mental Status: He is alert and oriented to person, place, and time.  Psychiatric:        Mood and Affect: Mood normal.        Behavior: Behavior normal.        Thought Content: Thought content normal.        Judgment: Judgment normal.      No results found for any visits on 12/08/20.  Assessment & Plan     1. Sprain of costal cartilage, initial encounter I would not pursue further evaluation at this time.  Use heating pad and alternating with ice.  2. Dilated cardiomyopathy ([place]) Followed by cardiology.  3. Paroxysmal atrial fibrillation (HCC)   4. Abdominal aortic aneurysm (AAA) without rupture ([place]) Followed by cardiology  5. CKD (chronic kidney disease), stage IV [facility]) Sees nephrology later in the week  6. History of redo bypass grafting   7. Class 1 obesity due to excess calories with serious comorbidity and body mass index (BMI) of 34.0 to 34.9 in adult Weight loss again discussed his I think it would help him with some of his discomfort in his back and chest wall.  He has significant truncal obesity.   No follow-ups on file.      I, [name], MD, have reviewed all documentation for this visit. The documentation on 12/12/20 for the exam, diagnosis, procedures, and orders are all accurate and complete.    Ronald Stracener [place] Mon, MD  [facility] [phone] (phone) [phone] (fax)  [place]

## 2020-12-09 DIAGNOSIS — N184 Chronic kidney disease, stage 4 (severe): Secondary | ICD-10-CM | POA: Diagnosis not present

## 2020-12-09 DIAGNOSIS — R600 Localized edema: Secondary | ICD-10-CM | POA: Diagnosis not present

## 2020-12-09 DIAGNOSIS — I10 Essential (primary) hypertension: Secondary | ICD-10-CM | POA: Diagnosis not present

## 2020-12-09 DIAGNOSIS — R82998 Other abnormal findings in urine: Secondary | ICD-10-CM | POA: Diagnosis not present

## 2020-12-11 DIAGNOSIS — I10 Essential (primary) hypertension: Secondary | ICD-10-CM | POA: Diagnosis not present

## 2020-12-11 DIAGNOSIS — R600 Localized edema: Secondary | ICD-10-CM | POA: Diagnosis not present

## 2020-12-11 DIAGNOSIS — R801 Persistent proteinuria, unspecified: Secondary | ICD-10-CM | POA: Diagnosis not present

## 2020-12-11 DIAGNOSIS — N184 Chronic kidney disease, stage 4 (severe): Secondary | ICD-10-CM | POA: Diagnosis not present

## 2020-12-11 DIAGNOSIS — N2581 Secondary hyperparathyroidism of renal origin: Secondary | ICD-10-CM | POA: Diagnosis not present

## 2020-12-11 DIAGNOSIS — I701 Atherosclerosis of renal artery: Secondary | ICD-10-CM | POA: Diagnosis not present

## 2020-12-11 NOTE — Patient Instructions (Signed)
Visit Information It was great speaking with you today!  Please let me know if you have any questions about our visit.   Goals Addressed             This Visit's Progress    Track and Manage My Blood Pressure-Hypertension       Timeframe:  Long-Range Goal Priority:  High Start Date: 12/11/2020                             Expected End Date:  12/11/2021                      Follow Up Date 02/27/2021    - check blood pressure 3 times per week    Why is this important?   You won't feel high blood pressure, but it can still hurt your blood vessels.  High blood pressure can cause heart or kidney problems. It can also cause a stroke.  Making lifestyle changes like losing a little weight or eating less salt will help.  Checking your blood pressure at home and at different times of the day can help to control blood pressure.  If the doctor prescribes medicine remember to take it the way the doctor ordered.  Call the office if you cannot afford the medicine or if there are questions about it.     Notes:         Patient Care Plan: General Pharmacy (Adult)     Problem Identified: Hypertension, Atrial Fibrillation, Coronary Artery Disease, GERD, Chronic Kidney Disease, and Allergic Rhinitis   Priority: High     Long-Range Goal: Patient-Specific Goal   Start Date: 12/12/2020  Expected End Date: 06/13/2021  This Visit's Progress: On track  Priority: High  Note:   Current Barriers:  Unable to independently monitor therapeutic efficacy  Pharmacist Clinical Goal(s):  Patient will maintain control of blood pressure as evidenced by BP less than 140/90  through collaboration with PharmD and provider.   Interventions: 1:1 collaboration with [name]., MD regarding development and update of comprehensive plan of care as evidenced by provider attestation and co-signature Inter-disciplinary care team collaboration (see longitudinal plan of care) Comprehensive medication review  performed; medication list updated in electronic medical record  Hypertension (BP goal <140/90) -Controlled -Current treatment: Amlodipine 10 mg daily  Enalapril 20 mg daily  Furosemide 40 mg daily as needed  Imdur 30 mg daily  Metoprolol 100 mg twice daily  -Medications previously tried: NA  -Current home readings: NA -Denies hypotensive/hypertensive symptoms -Educated on Daily salt intake goal < 2300 mg; -Counseled to monitor BP at home 2-3 times weekly, document, and provide log at future appointments -Recommended to continue current medication  Atrial Fibrillation (Goal: prevent stroke and major bleeding) -Controlled -CHADSVASC: 3 -Current treatment: Rate control: Amiodarone 200 mg daily  Metoprolol tartrate 100 mg twice daily  Anticoagulation: Eliquis 5 mg twice daily  -Medications previously tried: NA -Counseled on avoidance of NSAIDs due to increased bleeding risk with anticoagulants; -Recommended to continue current medication  Hyperlipidemia: (LDL goal < 70) -Controlled -History of NSTEMI -Current treatment: Atorvastatin 40 mg daily  -Medications previously tried: NA  -Educated on Strategies to manage statin-induced myalgias; -Recommended to continue current medication  Hypothyroidism (Goal: Maintain stable thyroid function) -Controlled -Current treatment  Levothyroxine 75 mcg daily before breakfast  -Medications previously tried: NA  -Counseled on diet and exercise extensively  Chronic Kidney Disease Stage 4  -  All medications assessed for renal dosing and appropriateness in chronic kidney disease. -Recommended to continue current medication   Patient Goals/Self-Care Activities Patient will:  - check blood pressure 2-3 times weekly, document, and provide at future appointments  Follow Up Plan: Telephone follow up appointment with care management team member scheduled for:  06/08/2021 at 3:00 PM      Patient agreed to services and verbal consent  obtained.   The patient verbalized understanding of instructions, educational materials, and care plan provided today and declined offer to receive copy of patient instructions, educational materials, and care plan.   Junius Argyle, PharmD, [place] [phone]

## 2020-12-23 ENCOUNTER — Ambulatory Visit: Payer: Self-pay | Admitting: Family Medicine

## 2020-12-25 ENCOUNTER — Other Ambulatory Visit: Payer: Self-pay | Admitting: Cardiovascular Disease

## 2021-01-13 ENCOUNTER — Telehealth: Payer: Self-pay

## 2021-01-13 NOTE — Progress Notes (Signed)
Chronic Care Management Pharmacy Assistant   Name: Ronald [name] [facility]  MRN: [id number] DOB: 28-May-1946  Reason for Encounter: Hypertension Disease State Call   Recent office visits:  No recent office visit  Recent consult visits:  12/11/2020 Murlean Iba, MD (Nephrology) for Follow-up- Pain left side and low left back- incrased Enalapril to 20 mg per note; patient instructed to follow-up in 3 months  Hospital visits:  None in previous 6 months  Medications: Outpatient Encounter Medications as of 01/13/2021  Medication Sig   ACETAMINOPHEN PO Take 650 mg by mouth. 2 at bedtime   allopurinol (ZYLOPRIM) 100 MG tablet Take 1 tablet (100 mg total) by mouth at bedtime.   amiodarone (PACERONE) 200 MG tablet TAKE 1 TABLET BY MOUTH  DAILY   amLODipine (NORVASC) 10 MG tablet TAKE 1 TABLET BY MOUTH EVERY DAY   apixaban (ELIQUIS) 5 MG TABS tablet Take 1 tablet (5 mg total) by mouth 2 (two) times daily.   atorvastatin (LIPITOR) 40 MG tablet Take 1 tablet (40 mg total) by mouth daily.   benzonatate (TESSALON) 100 MG capsule Take 1 capsule (100 mg total) by mouth 2 (two) times daily as needed for cough. (Patient not taking: Reported on 12/08/2020)   Cholecalciferol (VITAMIN D-3) 125 MCG (5000 UT) TABS Take 2,000 Units by mouth daily.    doxycycline (VIBRA-TABS) 100 MG tablet Take 1 tablet (100 mg total) by mouth 2 (two) times daily.   enalapril (VASOTEC) 20 MG tablet Take 20 mg by mouth daily.   furosemide (LASIX) 40 MG tablet Take 40 mg by mouth daily as needed for fluid or edema.    isosorbide mononitrate (IMDUR) 30 MG 24 hr tablet TAKE 1 TABLET BY MOUTH  DAILY   levothyroxine (SYNTHROID) 75 MCG tablet TAKE 1 TABLET (75 MCG TOTAL) BY MOUTH DAILY BEFORE BREAKFAST.   loratadine (CLARITIN) 10 MG tablet Take 10 mg by mouth daily as needed for allergies.   magnesium oxide (MAG-OX) 400 (240 Mg) MG tablet TAKE 1 TABLET BY MOUTH TWICE A DAY   Melatonin 5 MG TABS Take 1 tablet by mouth at bedtime.    metoprolol tartrate (LOPRESSOR) 100 MG tablet TAKE 1 TABLET BY MOUTH  TWICE DAILY   Multiple Vitamin (MULTIVITAMIN PO) Take 1 tablet by mouth daily.   nitroGLYCERIN (NITROSTAT) 0.4 MG SL tablet Place 0.4 mg under the tongue every 5 (five) minutes as needed for chest pain (Up to 3 times).    pantoprazole (PROTONIX) 40 MG tablet Take 2 tablets (80 mg total) by mouth daily.   polyethylene glycol (MIRALAX / GLYCOLAX) 17 g packet Take 17 g by mouth daily.   sodium bicarbonate 650 MG tablet Take 650 mg by mouth 2 (two) times daily. Evening   No facility-administered encounter medications on file as of 01/13/2021.   Care Gaps: COVID-19 Vaccine OPHTHALMOLOGY EXAM (last completed 06/15/2019) INFLUENZA VACCINE (last completed 03/20/2020  Star Rating Drugs: Enalapril 20 mg last filled on 11/21/2020 for a 90-Day supply with [facility] Atorvastatin 40 mg last filled on 11/28/2020 for a 90-Day supply with [facility]  Reviewed chart prior to disease state call. Spoke with patient regarding BP  Recent Office Vitals: BP Readings from Last 3 Encounters:  12/08/20 133/78  11/03/20 (!) 154/74  07/28/20 (!) 162/62   Pulse Readings from Last 3 Encounters:  12/08/20 (!) 51  11/03/20 (!) 58  07/28/20 (!) 55    Wt Readings from Last 3 Encounters:  12/08/20 247 lb (112 kg)  11/03/20 248  lb (112.5 kg)  07/28/20 249 lb 12.8 oz (113.3 kg)     Kidney Function Lab Results  Component Value Date/Time   CREATININE 2.46 (H) 03/20/2020 10:38 AM   CREATININE 2.67 (H) 10/10/2019 03:57 PM   CREATININE 2.44 (H) 11/24/2015 03:46 PM   CREATININE 2.17 (H) 10/20/2015 02:18 PM   GFRNONAA 25 (L) 03/20/2020 10:38 AM   GFRAA 28 (L) 03/20/2020 10:38 AM    BMP Latest Ref Rng & Units 03/20/2020 10/10/2019 05/01/2019  Glucose 65 - 99 mg/dL 124(H) 122(H) 130(H)  BUN 8 - 27 mg/dL 29(H) 28(H) 31(H)  Creatinine 0.76 - 1.27 mg/dL 2.46(H) 2.67(H) 2.59(H)  BUN/Creat Ratio 10 - 24 12 10 12   Sodium 134 - 144 mmol/L 145(H)  144 143  Potassium 3.5 - 5.2 mmol/L 4.8 5.3(H) 4.8  Chloride 96 - 106 mmol/L 110(H) 109(H) 108(H)  CO2 20 - 29 mmol/L 19(L) 22 22  Calcium 8.6 - 10.2 mg/dL 9.1 9.5 9.0    Current antihypertensive regimen:  Amlodipine 10 mg daily  Enalapril 20 mg daily  Furosemide 40 mg daily as needed  Imdur 30 mg daily  Metoprolol 100 mg twice daily   How often are you checking your Blood Pressure?  Spoke to the patient's spouse and she stated the patient does not check his blood pressure at home. I did advise her that per Cristie Hem he would like him to start checking it   2-3 times weekly. Patient's spouse stated she will try to get him to start taking it.  Current home BP readings: No readings available   What recent interventions/DTPs have been made by any provider to improve Blood Pressure control since last CPP Visit: Enalapril increased to 20 mg daily  Any recent hospitalizations or ED visits since last visit with CPP? No  What diet changes have been made to improve Blood Pressure Control?  Patient's spouse stated that he is not eating that much and has lost weight. Patient had COVID-19 a little over a month ago and he lost his taste at that time and has not gained it back. She stated the patient does eat but very little  What exercise is being done to improve your Blood Pressure Control?  Patient fell in his drive way about a month ago and since that time he has been having some left side pain, and left shoulder pain. I did reach out to the PCP's office and they are going to have a Triage Nurse call him and go from there. The spouse reports that patient is able to move around he is just in pain at the moment.  Adherence Review: Is the patient currently on ACE/ARB medication? Yes Does the patient have >5 day gap between last estimated fill dates? No   Patient has a scheduled for a follow-up with Junius Argyle, CPP on 06/08/2021 @ 1500.  AWV scheduled for 04/29/2021 @ [place],  CPA/CMA Catering manager Phone: [phone]

## 2021-01-15 ENCOUNTER — Ambulatory Visit: Payer: Self-pay

## 2021-01-15 NOTE — Telephone Encounter (Signed)
Patient's wife says that it is the pain in his side that they are most worried about. He has had the pain for about 1 month. No availability this week. Appt scheduled for next week.

## 2021-01-15 NOTE — Telephone Encounter (Signed)
Pt.'s wife reports pt. Slipped in the driveway and fell 1 month ago. Still has pain in left side and left shoulder. Moving left arm without difficulty. No availability with PCP this week. Would like to be worked in. Please advise.

## 2021-01-15 NOTE — Telephone Encounter (Signed)
Please advise. Thanks.  

## 2021-01-15 NOTE — Telephone Encounter (Signed)
Message from Loma [place] sent at 01/15/2021 10:34 AM EDT  Summary: FU request from [place], Pharmacist at [facility], his assistant, requesting a nurse cb to the wife of this pt as they were speaking with her re some meds. [name], wife states that pt fell in driveway  over a month ago and is still experiencing pain in his left side . It  seems that his left shoulder is also involved. [name], wife is also concerned as he had covid over a month ago also and can not taste and is eating very poorly.  Wants NT to fu at [phone].           Call History   Type Contact Phone/Fax User  01/15/2021 10:28 AM EDT Phone (Incoming)   Sharlet Salina, Alferd Patee (Pharmacy) [facility] assistant   Reason for Disposition  MILD weakness (i.e., does not interfere with ability to work, go to school, normal activities) (Exception: mild weakness is a chronic symptom)  Answer Assessment - Initial Assessment Questions 1. MECHANISM: "How did the fall happen?"     Fell in driveway 2. DOMESTIC VIOLENCE AND ELDER ABUSE SCREENING: "Did you fall because someone pushed you or tried to hurt you?" If Yes, ask: "Are you safe now?"     No 3. ONSET: "When did the fall happen?" (e.g., minutes, hours, or days ago)     1 month ago 4. LOCATION: "What part of the body hit the ground?" (e.g., back, buttocks, head, hips, knees, hands, head, stomach)     Left shoulder 5. INJURY: "Did you hurt (injure) yourself when you fell?" If Yes, ask: "What did you injure? Tell me more about this?" (e.g., body area; type of injury; pain severity)"     Left shoulder 6. PAIN: "Is there any pain?" If Yes, ask: "How bad is the pain?" (e.g., Scale 1-10; or mild,  moderate, severe)   - NONE (0): No pain   - MILD (1-3): Doesn't interfere with normal activities    - MODERATE (4-7): Interferes with normal activities or awakens from sleep    - SEVERE (8-10): Excruciating pain, unable to do any normal activities      Moderate 7. SIZE: For cuts,  bruises, or swelling, ask: "How large is it?" (e.g., inches or centimeters)      No 8. PREGNANCY: "Is there any chance you are pregnant?" "When was your last menstrual period?"     N/a 9. OTHER SYMPTOMS: "Do you have any other symptoms?" (e.g., dizziness, fever, weakness; new onset or worsening).      No 10. CAUSE: "What do you think caused the fall (or falling)?" (e.g., tripped, dizzy spell)       Slipped  Protocols used: Falls and [facility]

## 2021-01-16 ENCOUNTER — Other Ambulatory Visit: Payer: Self-pay | Admitting: Family Medicine

## 2021-01-16 DIAGNOSIS — K219 Gastro-esophageal reflux disease without esophagitis: Secondary | ICD-10-CM

## 2021-01-16 NOTE — Telephone Encounter (Signed)
Future visit in 4 days  

## 2021-01-20 ENCOUNTER — Ambulatory Visit: Payer: Medicare Other | Admitting: Family Medicine

## 2021-01-22 ENCOUNTER — Ambulatory Visit (INDEPENDENT_AMBULATORY_CARE_PROVIDER_SITE_OTHER): Payer: Medicare Other | Admitting: Family Medicine

## 2021-01-22 ENCOUNTER — Encounter: Payer: Self-pay | Admitting: Family Medicine

## 2021-01-22 ENCOUNTER — Other Ambulatory Visit: Payer: Self-pay

## 2021-01-22 VITALS — BP 172/66 | HR 50 | Temp 98.1°F | Wt 240.0 lb

## 2021-01-22 DIAGNOSIS — R1012 Left upper quadrant pain: Secondary | ICD-10-CM

## 2021-01-22 DIAGNOSIS — N184 Chronic kidney disease, stage 4 (severe): Secondary | ICD-10-CM | POA: Diagnosis not present

## 2021-01-22 DIAGNOSIS — E119 Type 2 diabetes mellitus without complications: Secondary | ICD-10-CM

## 2021-01-22 DIAGNOSIS — R0782 Intercostal pain: Secondary | ICD-10-CM | POA: Diagnosis not present

## 2021-01-22 DIAGNOSIS — I10 Essential (primary) hypertension: Secondary | ICD-10-CM | POA: Diagnosis not present

## 2021-01-22 NOTE — Progress Notes (Signed)
Established patient visit   Patient: Ronald [name] [facility]   DOB: 23-Jun-1945   75 y.o. Male  MRN: [id number] Visit Date: 01/22/2021  Today's healthcare provider: [name], MD   Chief Complaint  Patient presents with   Shoulder Pain   Abdominal Pain   Subjective  -------------------------------------------------------------------------------------------------------------------- Shoulder Pain  The pain is present in the left shoulder. This is a new problem. The current episode started more than 1 month ago. There has been a history of trauma [place] on the driveway about two month ago.). The problem has been gradually improving. Pertinent negatives include no fever, joint locking, joint swelling or limited range of motion.  Abdominal Pain This is a chronic problem. The current episode started more than 1 month ago. The pain is located in the generalized abdominal region. The pain is moderate. The quality of the pain is sharp. The abdominal pain does not radiate. Associated symptoms include arthralgias, constipation and weight loss. Pertinent negatives include no diarrhea, fever, myalgias, nausea or vomiting.    Patient planes of ongoing left lower lateral chest wall and left upper quadrant abdominal pain.  This been going on for several months now.  No other symptoms.  No GI or GU symptoms.  No respiratory symptoms, no cough.  Chronic DOE stable.  No weight loss fever or chills.    Medications: Outpatient Medications Prior to Visit  Medication Sig   ACETAMINOPHEN PO Take 650 mg by mouth. 2 at bedtime   allopurinol (ZYLOPRIM) 100 MG tablet Take 1 tablet (100 mg total) by mouth at bedtime.   amiodarone (PACERONE) 200 MG tablet TAKE 1 TABLET BY MOUTH  DAILY   amLODipine (NORVASC) 10 MG tablet TAKE 1 TABLET BY MOUTH EVERY DAY   apixaban (ELIQUIS) 5 MG TABS tablet Take 1 tablet (5 mg total) by mouth 2 (two) times daily.   atorvastatin (LIPITOR) 40 MG tablet Take 1 tablet (40 mg total)  by mouth daily.   benzonatate (TESSALON) 100 MG capsule Take 1 capsule (100 mg total) by mouth 2 (two) times daily as needed for cough.   Cholecalciferol (VITAMIN D-3) 125 MCG (5000 UT) TABS Take 2,000 Units by mouth daily.    doxycycline (VIBRA-TABS) 100 MG tablet Take 1 tablet (100 mg total) by mouth 2 (two) times daily.   enalapril (VASOTEC) 20 MG tablet Take 20 mg by mouth daily.   furosemide (LASIX) 40 MG tablet Take 40 mg by mouth daily as needed for fluid or edema.    isosorbide mononitrate (IMDUR) 30 MG 24 hr tablet TAKE 1 TABLET BY MOUTH  DAILY   levothyroxine (SYNTHROID) 75 MCG tablet TAKE 1 TABLET (75 MCG TOTAL) BY MOUTH DAILY BEFORE BREAKFAST.   loratadine (CLARITIN) 10 MG tablet Take 10 mg by mouth daily as needed for allergies.   magnesium oxide (MAG-OX) 400 (240 Mg) MG tablet TAKE 1 TABLET BY MOUTH TWICE A DAY   Melatonin 5 MG TABS Take 1 tablet by mouth at bedtime.   metoprolol tartrate (LOPRESSOR) 100 MG tablet TAKE 1 TABLET BY MOUTH  TWICE DAILY   Multiple Vitamin (MULTIVITAMIN PO) Take 1 tablet by mouth daily.   nitroGLYCERIN (NITROSTAT) 0.4 MG SL tablet Place 0.4 mg under the tongue every 5 (five) minutes as needed for chest pain (Up to 3 times).    pantoprazole (PROTONIX) 40 MG tablet TAKE 2 TABLETS BY MOUTH EVERY DAY   polyethylene glycol (MIRALAX / GLYCOLAX) 17 g packet Take 17 g by mouth daily.  sodium bicarbonate 650 MG tablet Take 650 mg by mouth 2 (two) times daily. Evening   No facility-administered medications prior to visit.    Review of Systems  Constitutional:  Positive for appetite change and weight loss. Negative for activity change, chills, diaphoresis, fatigue, fever and unexpected weight change.  Respiratory: Negative.    Cardiovascular: Negative.   Gastrointestinal:  Positive for abdominal pain and constipation. Negative for abdominal distention, anal bleeding, blood in stool, diarrhea, nausea, rectal pain and vomiting.  Musculoskeletal:  Positive for  arthralgias and back pain. Negative for gait problem, joint swelling, myalgias, neck pain and neck stiffness.      Objective  -------------------------------------------------------------------------------------------------------------------- BP (!) 172/66 (BP Location: Right Arm, Patient Position: Sitting, Cuff Size: Large)   Pulse (!) 50   Temp 98.1 F (36.7 C) (Oral)   Wt 240 lb (108.9 kg)   BMI 35.44 kg/m     Physical Exam Vitals reviewed.  Constitutional:      Appearance: Normal appearance.  HENT:     Head: Normocephalic and atraumatic.     Right Ear: External ear normal.     Left Ear: External ear normal.  Eyes:     General: No scleral icterus.    Conjunctiva/sclera: Conjunctivae normal.  Cardiovascular:     Rate and Rhythm: Normal rate and regular rhythm.     Pulses: Normal pulses.     Heart sounds: Normal heart sounds.  Pulmonary:     Effort: Pulmonary effort is normal.     Breath sounds: Normal breath sounds.  Abdominal:     Palpations: Abdomen is soft.     Comments: No CVAT. Has mild left upper quadrant tenderness and tenderness along the left lower lateral ribs   Musculoskeletal:     Right lower leg: No edema.     Left lower leg: No edema.  Skin:    General: Skin is warm and dry.  Neurological:     General: No focal deficit present.     Mental Status: He is alert and oriented to person, place, and time.  Psychiatric:        Mood and Affect: Mood normal.        Behavior: Behavior normal.        Thought Content: Thought content normal.        Judgment: Judgment normal.      No results found for any visits on 01/22/21.  Assessment & Plan  ---------------------------------------------------------------------------------------------------------------------- 1. Type 2 diabetes mellitus without complication, without long-term current use of insulin (HCC) Goal A1c less than 8 - CBC - Comprehensive metabolic panel - Hemoglobin A1c  2. Essential  hypertension Blood pressure up a little bit today but he is in pain. - CBC - TSH  3. CKD (chronic kidney disease), stage IV (HCC) Followed by nephrology - CBC - Comprehensive metabolic panel  4. Intercostal pain Obtain CT of the chest and abdomen without contrast due to his significant chronic kidney disease - CT Chest Wo Contrast; Future  5. LUQ pain CT without contrast - CT Abdomen Pelvis Wo Contrast; Future   No follow-ups on file.      I, [name], MD, have reviewed all documentation for this visit. The documentation on 01/31/21 for the exam, diagnosis, procedures, and orders are all accurate and complete.    Miran Kautzman Cranford Mon, MD  [facility] [phone] (phone) [phone] (fax)  [place]

## 2021-01-23 LAB — COMPREHENSIVE METABOLIC PANEL
ALT: 17 IU/L (ref 0–44)
AST: 20 IU/L (ref 0–40)
Albumin/Globulin Ratio: 2.2 (ref 1.2–2.2)
Albumin: 4.3 g/dL (ref 3.7–4.7)
Alkaline Phosphatase: 155 IU/L — ABNORMAL HIGH (ref 44–121)
BUN/Creatinine Ratio: 13 (ref 10–24)
BUN: 41 mg/dL — ABNORMAL HIGH (ref 8–27)
Bilirubin Total: 0.9 mg/dL (ref 0.0–1.2)
CO2: 21 mmol/L (ref 20–29)
Calcium: 9.4 mg/dL (ref 8.6–10.2)
Chloride: 105 mmol/L (ref 96–106)
Creatinine, Ser: 3.21 mg/dL — ABNORMAL HIGH (ref 0.76–1.27)
Globulin, Total: 2 g/dL (ref 1.5–4.5)
Glucose: 121 mg/dL — ABNORMAL HIGH (ref 65–99)
Potassium: 5.3 mmol/L — ABNORMAL HIGH (ref 3.5–5.2)
Sodium: 142 mmol/L (ref 134–144)
Total Protein: 6.3 g/dL (ref 6.0–8.5)
eGFR: 19 mL/min/{1.73_m2} — ABNORMAL LOW (ref >59)

## 2021-01-23 LAB — HEMOGLOBIN A1C
Est. average glucose Bld gHb Est-mCnc: 126 mg/dL
Hgb A1c MFr Bld: 6 % — ABNORMAL HIGH (ref 4.8–5.6)

## 2021-01-23 LAB — CBC
Hematocrit: 35.9 % — ABNORMAL LOW (ref 37.5–51.0)
Hemoglobin: 11.7 g/dL — ABNORMAL LOW (ref 13.0–17.7)
MCH: 29.3 pg (ref 26.6–33.0)
MCHC: 32.6 g/dL (ref 31.5–35.7)
MCV: 90 fL (ref 79–97)
Platelets: 161 10*3/uL (ref 150–450)
RBC: 4 x10E6/uL — ABNORMAL LOW (ref 4.14–5.80)
RDW: 14.4 % (ref 11.6–15.4)
WBC: 5.7 10*3/uL (ref 3.4–10.8)

## 2021-01-23 LAB — TSH: TSH: 2.68 u[IU]/mL (ref 0.450–4.500)

## 2021-01-26 ENCOUNTER — Encounter: Payer: Self-pay | Admitting: Cardiovascular Disease

## 2021-01-26 ENCOUNTER — Other Ambulatory Visit: Payer: Self-pay

## 2021-01-26 ENCOUNTER — Ambulatory Visit: Payer: Medicare Other | Admitting: Cardiovascular Disease

## 2021-01-26 VITALS — BP 138/66 | HR 54 | Ht 69.0 in | Wt 234.0 lb

## 2021-01-26 DIAGNOSIS — I5042 Chronic combined systolic (congestive) and diastolic (congestive) heart failure: Secondary | ICD-10-CM

## 2021-01-26 DIAGNOSIS — E7800 Pure hypercholesterolemia, unspecified: Secondary | ICD-10-CM

## 2021-01-26 DIAGNOSIS — I2510 Atherosclerotic heart disease of native coronary artery without angina pectoris: Secondary | ICD-10-CM

## 2021-01-26 DIAGNOSIS — I10 Essential (primary) hypertension: Secondary | ICD-10-CM | POA: Diagnosis not present

## 2021-01-26 DIAGNOSIS — I255 Ischemic cardiomyopathy: Secondary | ICD-10-CM | POA: Diagnosis not present

## 2021-01-26 DIAGNOSIS — I480 Paroxysmal atrial fibrillation: Secondary | ICD-10-CM | POA: Diagnosis not present

## 2021-01-26 DIAGNOSIS — I714 Abdominal aortic aneurysm, without rupture, unspecified: Secondary | ICD-10-CM

## 2021-01-26 NOTE — Progress Notes (Signed)
Chief Complaint  Patient presents with   Follow-up    CAD    History of Present Illness: 75 yo male with history of CAD s/p CABG in 1996 (LIMA-LAD, SVG-circumflex, SVG-RCA, SVG-diagonal) with redo bypass in 2007 (SVG-OM, SVG-RCA), ischemic cardiomyopathy, CKD, DM 2, HTN, HLD, AAA, renal artery stenosis, COPD, GERD, atrial fibrillation and PVCs here today for cardiac follow up. Echo in 2014 with normal LV systolic function. He was hospitalized at [facility] July 2016 with a NSTEMI. Cardiac cath with severe disease in the SVG to OM treated with a drug eluting stent. He was treated with Brilinta post PCI but developed dyspnea and was changed to Plavix. He was admitted to [facility] May 2017 with unstable angina and cardiac cath showed severe disease in the distal body/anastomosis of the SVG to Diagonal.  A drug eluting stent was placed in this vein graft. The LIMA to LAD was patent, SVG to OM was patent, SVG to PDA/PLA was patent. He was admitted to [facility] February 2018 with chest pain. Troponin was negative x 3. He did not have an ischemic evaluation. Echo February 2018 with  mild LV systolic dysfunction. LVEF=45-50%. I saw him in the office in January 2019 and he was volume overloaded. Lasix was started. He was admitted to [facility] February 2019 with unstable angina and cardiac cath showed patency of all of the bypass grafts. Echo February 2019 with normal LV systolic function, BJYN=82-95%, grade 1 diastolic dysfunction. He was admitted to [facility] January 2020 with atrial fib/flutter with RVR. Started on Xarelto and initially rate controlled with IV Cardizem. He was loaded with IV amiodarone and discharged home on po amiodarone and Lopressor (Coreg stopped during admission). His Plavix and Norvasc were stopped at discharge. His Norvasc was restarted in primary care. Echo January 2020 with LVEF=30-35%. Mild MR. Abdominal u/s April 2022 with 3.1 cm AAA, stable.   He is here today for follow up. The patient denies any chest  pain, dyspnea, palpitations, lower extremity edema, orthopnea, PND, dizziness, near syncope or syncope. He has baseline chronic dyspnea.   Primary Care Physician: [name]., MD  Past Medical History:  Diagnosis Date   AAA (abdominal aortic aneurysm) ([place])    a. 3cm by [place] 2015.   Arthritis    "hips; back" (12/13/2014)   CAD (coronary artery disease) 2007   a. s/p CABG- IMA-LAD, VG-Cx, VG-RCA, VG-diag in 1999. B. sp redo CABG- VG-OM, VG-RCA in 2007 due to VG disease. c. NSTEMI 11/2014 s/p DES to SVG-OM from the Y graft.d. PTCA/DES x 1 distal body of SVG to Diagonal.09/2015   Chronic combined systolic and diastolic CHF (congestive heart failure) ([place])    a. remote EF 40-45% in 2006. b. Normal EF 2014. b. Echo 07/2016 EF 45-50%, grade 1 DD.   Chronic lower back pain    CKD (chronic kidney disease), stage IV (HCC)    COPD (chronic obstructive pulmonary disease) (HCC)    Deafness in left ear    Degenerative disc disease, lumbar    Dilated cardiomyopathy ([place]) 10/07/2015   Emphysema    Esophageal stricture 07/02/1998   EGD   GERD (gastroesophageal reflux disease)    History of gout    "last flareup was in 2007" (12/13/2014)   History of hiatal hernia    Hyperlipidemia    Hypertension    Ischemic cardiomyopathy 2006   EF 40% to 50% by 2D echo in 2006;  Echo 12/31/12: Mild LVH, EF 50-55%, normal wall motion.  PVC's (premature ventricular contractions)    Renal artery stenosis ([place])    a. noted on CT 2008.   Type II diabetes mellitus (HCC)    Diet control    Walking pneumonia 1990's    Past Surgical History:  Procedure Laterality Date   CARDIAC CATHETERIZATION  "several"   CARDIAC CATHETERIZATION N/A 12/13/2014   Procedure: Left Heart Cath and Coronary Angiography;  Surgeon: Jettie Booze, MD;  Location: [place] [facility];  Service: Cardiovascular;  Laterality: N/A;   CARDIAC CATHETERIZATION  12/13/2014   Procedure: Coronary Stent Intervention;  Surgeon: Jettie Booze, MD;  Location: [place] [facility];  Service: Cardiovascular;;   CARDIAC CATHETERIZATION N/A 10/07/2015   Procedure: Left Heart Cath and Cors/Grafts Angiography;  Surgeon: Burnell Blanks, MD;  Location: [place] [facility];  Service: Cardiovascular;  Laterality: N/A;   CARDIAC CATHETERIZATION N/A 10/07/2015   Procedure: Coronary Stent Intervention;  Surgeon: Burnell Blanks, MD;  Location: [place] [facility];  Service: Cardiovascular;  Laterality: N/A;   CORONARY ANGIOPLASTY  "several"   CORONARY ANGIOPLASTY WITH STENT PLACEMENT  2005; 12/13/2014   "2; 1"   CORONARY ARTERY BYPASS GRAFT  1996   CABG X5   CORONARY ARTERY BYPASS GRAFT  March 2007   CABG X3   ESOPHAGOGASTRODUODENOSCOPY (EGD) WITH ESOPHAGEAL DILATION  2000   GREEN LIGHT LASER TURP (TRANSURETHRAL RESECTION OF PROSTATE  2000's   "not cancerous"   HERNIA REPAIR     LAPAROSCOPIC CHOLECYSTECTOMY     LEFT HEART CATH AND CORS/GRAFTS ANGIOGRAPHY N/A 07/28/2017   Procedure: LEFT HEART CATH AND CORS/GRAFTS ANGIOGRAPHY;  Surgeon: Troy Sine, MD;  Location: [place] [facility];  Service: Cardiovascular;  Laterality: N/A;   LUNG SURGERY  1996   "S/P CABG, had to put staple in lung after it had collapsed"   UMBILICAL HERNIA REPAIR     w/chole    Current Outpatient Medications  Medication Sig Dispense Refill   ACETAMINOPHEN PO Take 650 mg by mouth. 2 at bedtime     allopurinol (ZYLOPRIM) 100 MG tablet Take 1 tablet (100 mg total) by mouth at bedtime. 90 tablet 3   amiodarone (PACERONE) 200 MG tablet TAKE 1 TABLET BY MOUTH  DAILY 90 tablet 3   amLODipine (NORVASC) 10 MG tablet TAKE 1 TABLET BY MOUTH EVERY DAY 90 tablet 1   apixaban (ELIQUIS) 5 MG TABS tablet Take 1 tablet (5 mg total) by mouth 2 (two) times daily. 180 tablet 3   atorvastatin (LIPITOR) 40 MG tablet Take 1 tablet (40 mg total) by mouth daily. 90 tablet 2   Cholecalciferol (VITAMIN D-3) 125 MCG (5000 UT) TABS Take 2,000 Units by mouth daily.       enalapril (VASOTEC) 20 MG tablet Take 20 mg by mouth daily.     furosemide (LASIX) 40 MG tablet Take 40 mg by mouth daily as needed for fluid or edema.   6   isosorbide mononitrate (IMDUR) 30 MG 24 hr tablet TAKE 1 TABLET BY MOUTH  DAILY 90 tablet 3   levothyroxine (SYNTHROID) 75 MCG tablet TAKE 1 TABLET (75 MCG TOTAL) BY MOUTH DAILY BEFORE BREAKFAST. 90 tablet 2   loratadine (CLARITIN) 10 MG tablet Take 10 mg by mouth daily as needed for allergies.     magnesium oxide (MAG-OX) 400 (240 Mg) MG tablet TAKE 1 TABLET BY MOUTH TWICE A DAY 180 tablet 0   Melatonin 5 MG TABS Take 1 tablet by mouth at bedtime.  metoprolol tartrate (LOPRESSOR) 100 MG tablet TAKE 1 TABLET BY MOUTH  TWICE DAILY 180 tablet 3   Multiple Vitamin (MULTIVITAMIN PO) Take 1 tablet by mouth daily.     nitroGLYCERIN (NITROSTAT) 0.4 MG SL tablet Place 0.4 mg under the tongue every 5 (five) minutes as needed for chest pain (Up to 3 times).      pantoprazole (PROTONIX) 40 MG tablet TAKE 2 TABLETS BY MOUTH EVERY DAY 30 tablet 0   polyethylene glycol (MIRALAX / GLYCOLAX) 17 g packet Take 17 g by mouth daily.     sodium bicarbonate 650 MG tablet Take 650 mg by mouth 2 (two) times daily. Evening     benzonatate (TESSALON) 100 MG capsule Take 1 capsule (100 mg total) by mouth 2 (two) times daily as needed for cough. (Patient not taking: Reported on 01/26/2021) 20 capsule 0   doxycycline (VIBRA-TABS) 100 MG tablet Take 1 tablet (100 mg total) by mouth 2 (two) times daily. (Patient not taking: Reported on 01/26/2021) 10 tablet 0   No current facility-administered medications for this visit.    Allergies  Allergen Reactions   Predicort [Prednisolone] Other (See Comments)    Stomach pain   Ciprofloxacin     GI upset   Hydrochlorothiazide Other (See Comments)    Dehydration   Hydrocodone     Stomach upset   Hydrocodone-Acetaminophen     Stomach upset   Hydrocodone-Acetaminophen Nausea Only   Loratadine Other (See Comments)     Other reaction(s): Unknown   Sulfa Antibiotics Other (See Comments)    Cannot recall   Sulfacetamide Sodium Other (See Comments)    Cannot recall   Sulfasalazine     Other reaction(s): Other (See Comments) Cannot recall   Hydrocodone-Acetaminophen Nausea Only   Penicillins Hives and Rash    Has patient had a PCN reaction causing immediate rash, facial/tongue/throat swelling, SOB or lightheadedness with hypotension: YES Has patient had a PCN reaction causing severe rash involving mucus membranes or skin necrosis: NO Has patient had a PCN reaction that required hospitalization NO Has patient had a PCN reaction occurring within the last 10 years:NO If all of the above answers are "NO", then may proceed with Cephalosporin use.    Social History   Socioeconomic History   Marital status: Married    Spouse name: Not on file   Number of children: 2   Years of education: Not on file   Highest education level: 8th grade  Occupational History   Occupation: Retired  Tobacco Use   Smoking status: Former    Packs/day: 3.00    Years: 20.00    Pack years: 60.00    Types: Cigarettes    Quit date: 07/25/1986    Years since quitting: 34.5   Smokeless tobacco: Never  Vaping Use   Vaping Use: Never used  Substance and Sexual Activity   Alcohol use: No    Alcohol/week: 0.0 standard drinks   Drug use: No   Sexual activity: Not Currently  Other Topics Concern   Not on file  Social History Narrative   Did auto salvage work.   Lives at home with his wife.  Independent at baseline.   Social Determinants of Health   Financial Resource Strain: Low Risk    Difficulty of Paying Living Expenses: Not hard at all  Food Insecurity: No Food Insecurity   Worried About Charity fundraiser in the Last Year: Never true   Ages in the Last Year: Never  true  Transportation Needs: No Transportation Needs   Lack of Transportation (Medical): No   Lack of Transportation (Non-Medical): No   Physical Activity: Inactive   Days of Exercise per Week: 0 days   Minutes of Exercise per Session: 0 min  Stress: No Stress Concern Present   Feeling of Stress : Not at all  Social Connections: Moderately Isolated   Frequency of Communication with Friends and Family: Once a week   Frequency of Social Gatherings with Friends and Family: More than three times a week   Attends Religious Services: Never   Marine scientist or Organizations: No   Attends Music therapist: Never   Marital Status: Married  Human resources officer Violence: Not At Risk   Fear of Current or Ex-Partner: No   Emotionally Abused: No   Physically Abused: No   Sexually Abused: No    Family History  Problem Relation Age of Onset   Heart attack Mother        MI   Stroke Mother    Heart disease Mother    Hypertension Mother    Hyperlipidemia Mother    Heart disease Father    Rheumatic fever Father    Colon cancer Neg Hx     Review of Systems:  As stated in the HPI and otherwise negative.   BP 138/66   Pulse (!) 54   Ht 5\' 9"  (1.753 m)   Wt 234 lb (106.1 kg)   SpO2 97%   BMI 34.56 kg/m   Physical Examination:  General: Well developed, well nourished, NAD  HEENT: OP clear, mucus membranes moist  SKIN: warm, dry. No rashes. Neuro: No focal deficits  Musculoskeletal: Muscle strength 5/5 all ext  Psychiatric: Mood and affect normal  Neck: No JVD, no carotid bruits, no thyromegaly, no lymphadenopathy.  Lungs:Clear bilaterally, no wheezes, rhonci, crackles Cardiovascular: Regular rate and rhythm. No murmurs, gallops or rubs. Abdomen:Soft. Bowel sounds present. Non-tender.  Extremities: No lower extremity edema. Pulses are 2 + in the bilateral DP/PT.  Echo January 2020: Left ventricle: The cavity size was moderately dilated. Systolic    function was moderately to severely reduced. The estimated    ejection fraction was in the range of 30% to 35%. Diffuse    hypokinesis. The study is  not technically sufficient to allow    evaluation of LV diastolic function.  - Mitral valve: There was mild regurgitation.  - Left atrium: The atrium was mildly dilated.  - Right ventricle: The cavity size was mildly dilated. Wall    thickness was normal. Systolic function was mildly reduced  Cardiac cath February 2019: Significant native CAD with total occlusion of the LAD after the takeoff of the first diagonal vessel; total occlusion of the proximal left circumflex coronary artery; and total occlusion of the proximal RCA with antegrade bridging collaterals.  Patent LIMA to LAD.  Patent Y vein graft from the 1996 surgery which supplies the diagonal vessel and distal circumflex marginal vessel.  There is diffuse narrowing of 50% in the midportion of the Y graft supplying the diagonal vessel and a patent distal stent extending to the ostium of the diagonal.  The Y graft supplying the distal marginal is free of significant disease and has mild luminal irregularity. Patent SVG supplying the distal RCA from the 2007 surgery with a stent in the proximal portion of the graft with 30% narrowings in the proximal and mid segment.  LVEDP 18 mm  Diagnostic Diagram  EKG:  EKG is not ordered today. The ekg ordered today demonstrates   Recent Labs: 01/22/2021: ALT 17; BUN 41; Creatinine, Ser 3.21; Hemoglobin 11.7; Platelets 161; Potassium 5.3; Sodium 142; TSH 2.680   Lipid Panel    Component Value Date/Time   CHOL 147 03/20/2020 1038   TRIG 80 03/20/2020 1038   HDL 52 03/20/2020 1038   CHOLHDL 2.8 03/20/2020 1038   CHOLHDL 4.5 12/13/2014 0423   VLDL 25 12/13/2014 0423   LDLCALC 80 03/20/2020 1038     Wt Readings from Last 3 Encounters:  01/26/21 234 lb (106.1 kg)  01/22/21 240 lb (108.9 kg)  12/08/20 247 lb (112 kg)     Other studies Reviewed: Additional studies/ records that were reviewed today include: . Review of the above records demonstrates:    Assessment and Plan:    1. CAD s/p CABG without angina: Cardiac cath February 2019 with 4 patent vein grafts. No chest pain. Continue statin, beta blocker and Imdur. He is off of ASA due to easy bruising. He is also on Eliquis.       2. Ischemic Cardiomyopathy: LVEF =35% by echo at [facility] January 2020. No Ace-inh or ARB due to CKD. Continue beta blocker.   3. Hypertension: BP is controlled at home. No changes  4. Hyperlipidemia: Lipids followed in primary care. LDL near goal October 2021. Continue statin  5. CKD: Followed by Nephrology. Ace-inh stopped due to renal insufficiency.   6. Chronic systolic CHF: Weight stable. No volume overload on exam. Lasix as needed.      7. PVCs/Bigeminy: He has no palpitations. Continue beta blocker  8. AAA: Slable at 3.1 cm by u/s April 2022.      9. Atrial fibrillation, paroxysmal: Sinus on exam. Will continue beta blocker, amiodarone and Eliquis. TSH and chest xray recently ok.   10. Sleep apnea: he is on CPAP.  Current medicines are reviewed at length with the patient today.  The patient does not have concerns regarding medicines.  The following changes have been made:  no change  Labs/ tests ordered today include:   No orders of the defined types were placed in this encounter.   Disposition:   FU with me in 12 months.    Signed, Lauree Chandler, MD 01/26/2021 3:05 PM    [facility] [address], [place], [place]  [address] Phone: [phone]; Fax: [phone]

## 2021-01-26 NOTE — Patient Instructions (Signed)
Medication Instructions:  No changes *If you need a refill on your cardiac medications before your next appointment, please call your pharmacy*   Lab Work: none  Testing/Procedures: none   Follow-Up: At [facility], you and your health needs are our priority.  As part of our continuing mission to provide you with exceptional heart care, we have created designated Provider Care Teams.  These Care Teams include your primary Cardiologist (physician) and Advanced Practice Providers (APPs -  Physician Assistants and Nurse Practitioners) who all work together to provide you with the care you need, when you need it.   Your next appointment:   12 month(s)  The format for your next appointment:   In Person  Provider:   You may see Christopher McAlhany, MD or one of the following Advanced Practice Providers on your designated Care Team:   Dayna Dunn, PA-C Michele Lenze, PA-C   Other Instructions   

## 2021-01-27 ENCOUNTER — Other Ambulatory Visit: Payer: Self-pay | Admitting: Family Medicine

## 2021-01-27 DIAGNOSIS — K219 Gastro-esophageal reflux disease without esophagitis: Secondary | ICD-10-CM

## 2021-02-05 ENCOUNTER — Other Ambulatory Visit: Payer: Self-pay | Admitting: Family Medicine

## 2021-02-05 ENCOUNTER — Telehealth: Payer: Self-pay | Admitting: *Deleted

## 2021-02-05 DIAGNOSIS — K219 Gastro-esophageal reflux disease without esophagitis: Secondary | ICD-10-CM

## 2021-02-05 NOTE — Chronic Care Management (AMB) (Signed)
  Care Management   Note  02/05/2021 Name: Ronald [name] MRN: [id number] DOB: [birth year]  Ronald [name] is a 75 y.o. year old male who is a primary care patient of [name]., MD and is actively engaged with the care management team. I reached out to [facility] [name] by phone today to assist with re-scheduling a follow up visit with the Pharmacist  Follow up plan: Telephone appointment with care management team member scheduled for: 06/09/2021 - LM for [name] to inform him of schedule change   Julian Hy, [place], [place] Management  Direct Dial: [phone]

## 2021-02-05 NOTE — Telephone Encounter (Signed)
Patient instructions- 2 tablets/day- # changed to 1 month supply #60

## 2021-02-12 ENCOUNTER — Telehealth: Payer: Self-pay

## 2021-02-12 ENCOUNTER — Ambulatory Visit
Admission: RE | Admit: 2021-02-12 | Discharge: 2021-02-12 | Disposition: A | Discharge status: Home / Self Care | Payer: Medicare Other | Source: Ambulatory Visit | Attending: Family Medicine | Admitting: Family Medicine

## 2021-02-12 ENCOUNTER — Other Ambulatory Visit: Payer: Self-pay

## 2021-02-12 DIAGNOSIS — J439 Emphysema, unspecified: Secondary | ICD-10-CM | POA: Diagnosis not present

## 2021-02-12 DIAGNOSIS — N281 Cyst of kidney, acquired: Secondary | ICD-10-CM | POA: Diagnosis not present

## 2021-02-12 DIAGNOSIS — R1012 Left upper quadrant pain: Secondary | ICD-10-CM | POA: Diagnosis not present

## 2021-02-12 DIAGNOSIS — R0782 Intercostal pain: Secondary | ICD-10-CM | POA: Insufficient documentation

## 2021-02-12 DIAGNOSIS — R079 Chest pain, unspecified: Secondary | ICD-10-CM | POA: Diagnosis not present

## 2021-02-12 DIAGNOSIS — I700 Atherosclerosis of aorta: Secondary | ICD-10-CM | POA: Diagnosis not present

## 2021-02-12 DIAGNOSIS — N3289 Other specified disorders of bladder: Secondary | ICD-10-CM | POA: Diagnosis not present

## 2021-02-12 NOTE — Progress Notes (Signed)
Chronic Care Management Pharmacy Assistant   Name: Ronald [name] [facility]  MRN: [id number] DOB: Sep 17, 1945  Reason for Encounter: Hypertension Disease State Call   Recent office visits:  01/22/2021 Miguel Aschoff, MD (PCP Office Visit) for Shoulder Pain/Abdominal Pain-  No medication changes noted; labs ordered, CT Abdomen Pelvis Wo Contrast Ordered, CT Chest Wo Contrast ordered  Recent consult visits:  01/26/2021 Lauree Chandler, MD (Cardiology) for Follow-up- no medication changes indicated; patient instructed to follow-up in 1 year.  Hospital visits:  None in previous 6 months  Medications: Outpatient Encounter Medications as of 02/12/2021  Medication Sig   ACETAMINOPHEN PO Take 650 mg by mouth. 2 at bedtime   allopurinol (ZYLOPRIM) 100 MG tablet Take 1 tablet (100 mg total) by mouth at bedtime.   amiodarone (PACERONE) 200 MG tablet TAKE 1 TABLET BY MOUTH  DAILY   amLODipine (NORVASC) 10 MG tablet TAKE 1 TABLET BY MOUTH EVERY DAY   apixaban (ELIQUIS) 5 MG TABS tablet Take 1 tablet (5 mg total) by mouth 2 (two) times daily.   atorvastatin (LIPITOR) 40 MG tablet Take 1 tablet (40 mg total) by mouth daily.   benzonatate (TESSALON) 100 MG capsule Take 1 capsule (100 mg total) by mouth 2 (two) times daily as needed for cough. (Patient not taking: Reported on 01/26/2021)   Cholecalciferol (VITAMIN D-3) 125 MCG (5000 UT) TABS Take 2,000 Units by mouth daily.    doxycycline (VIBRA-TABS) 100 MG tablet Take 1 tablet (100 mg total) by mouth 2 (two) times daily. (Patient not taking: Reported on 01/26/2021)   enalapril (VASOTEC) 20 MG tablet Take 20 mg by mouth daily.   furosemide (LASIX) 40 MG tablet Take 40 mg by mouth daily as needed for fluid or edema.    isosorbide mononitrate (IMDUR) 30 MG 24 hr tablet TAKE 1 TABLET BY MOUTH  DAILY   levothyroxine (SYNTHROID) 75 MCG tablet TAKE 1 TABLET (75 MCG TOTAL) BY MOUTH DAILY BEFORE BREAKFAST.   loratadine (CLARITIN) 10 MG tablet Take 10 mg by mouth  daily as needed for allergies.   magnesium oxide (MAG-OX) 400 (240 Mg) MG tablet TAKE 1 TABLET BY MOUTH TWICE A DAY   Melatonin 5 MG TABS Take 1 tablet by mouth at bedtime.   metoprolol tartrate (LOPRESSOR) 100 MG tablet TAKE 1 TABLET BY MOUTH  TWICE DAILY   Multiple Vitamin (MULTIVITAMIN PO) Take 1 tablet by mouth daily.   nitroGLYCERIN (NITROSTAT) 0.4 MG SL tablet Place 0.4 mg under the tongue every 5 (five) minutes as needed for chest pain (Up to 3 times).    pantoprazole (PROTONIX) 40 MG tablet TAKE 2 TABLETS BY MOUTH EVERY DAY   polyethylene glycol (MIRALAX / GLYCOLAX) 17 g packet Take 17 g by mouth daily.   sodium bicarbonate 650 MG tablet Take 650 mg by mouth 2 (two) times daily. Evening   No facility-administered encounter medications on file as of 02/12/2021.   Care Gaps: COVID-19 Vaccine Booster 3 Diabetic Eye Exam (last completed 06/15/2019) Influenza Vaccine (last completed 03/19/2020) Blood Pressure greater than 140/90 during last PCP visit it was 172/66 on 08/25 during Cardio visit it was 136/66  Star Rating Drugs: Enalapril 20 mg last filled on 11/21/2020 for a 90-Day supply with [facility] Atorvastatin 40 mg last filled on 11/28/2020 for a 90-Day supply with [facility]  Reviewed chart prior to disease state call. Spoke with patient regarding BP  Recent Office Vitals: BP Readings from Last 3 Encounters:  01/26/21 138/66  01/22/21 (!) 172/66  12/08/20 133/78   Pulse Readings from Last 3 Encounters:  01/26/21 (!) 54  01/22/21 (!) 50  12/08/20 (!) 51    Wt Readings from Last 3 Encounters:  01/26/21 234 lb (106.1 kg)  01/22/21 240 lb (108.9 kg)  12/08/20 247 lb (112 kg)     Kidney Function Lab Results  Component Value Date/Time   CREATININE 3.21 (H) 01/22/2021 04:18 PM   CREATININE 2.46 (H) 03/20/2020 10:38 AM   CREATININE 2.44 (H) 11/24/2015 03:46 PM   CREATININE 2.17 (H) 10/20/2015 02:18 PM   GFRNONAA 25 (L) 03/20/2020 10:38 AM   GFRAA 28 (L)  03/20/2020 10:38 AM    BMP Latest Ref Rng & Units 01/22/2021 03/20/2020 10/10/2019  Glucose 65 - 99 mg/dL 121(H) 124(H) 122(H)  BUN 8 - 27 mg/dL 41(H) 29(H) 28(H)  Creatinine 0.76 - 1.27 mg/dL 3.21(H) 2.46(H) 2.67(H)  BUN/Creat Ratio 10 - 24 13 12 10   Sodium 134 - 144 mmol/L 142 145(H) 144  Potassium 3.5 - 5.2 mmol/L 5.3(H) 4.8 5.3(H)  Chloride 96 - 106 mmol/L 105 110(H) 109(H)  CO2 20 - 29 mmol/L 21 19(L) 22  Calcium 8.6 - 10.2 mg/dL 9.4 9.1 9.5    Current antihypertensive regimen:  Amlodipine 10 mg daily  Enalapril 20 mg daily  Furosemide 40 mg daily as needed  Imdur 30 mg daily  Metoprolol 100 mg twice daily   How often are you checking your Blood Pressure?  Patient's spouse reports that the patient is still not taking his blood pressure at home. Last time we spoke she was going to encourage him to take it but at this time he is just not  Current home BP readings: N/A  What recent interventions/DTPs have been made by any provider to improve Blood Pressure control since last CPP Visit: None ID  Any recent hospitalizations or ED visits since last visit with CPP? No  What diet changes have been made to improve Blood Pressure Control?  She reports that patient is eating well now. He does have pain in his side that he has been complaining about and he recently had a CT Scan done but that is not effecting how he eats at this time. He is not on a certain diet.   What exercise is being done to improve your Blood Pressure Control?  Patient has back pain and he is having side pain so he doesn't move around very much but he is able to move around on his on. Patient has an upcoming appointment with PCP on Monday to discuss result of his CT scan and see how they can help improve his pain in his side. Patient only takes Tylenol for the pain due to his kidneys as he is not allowed to take any other medications.   Adherence Review: Is the patient currently on ACE/ARB medication? Yes Does the  patient have >5 day gap between last estimated fill dates? No  Patient's spouse reports outside of the pain the patient has his only other issue is him not sleeping well despite being on CPAP machine. Patient's spouse reports that she does not know why the patient can't sleep. When asked is he taking anything to assist him with sleeping she reported that due to patient's kidneys he can only take 5 mg of Melatonin and 1 benadryl but that still does not help him sleep.   Patient has a future appointment with Junius Argyle, CPP on 06/09/2021 @1500  AWV scheduled for 04/29/2021 @ 1400  Lynann Bologna, CPA/CMA Clinical  Pharmacist Assistant Phone: [phone]

## 2021-02-23 ENCOUNTER — Ambulatory Visit: Payer: Medicare Other | Admitting: Family Medicine

## 2021-02-24 DIAGNOSIS — N184 Chronic kidney disease, stage 4 (severe): Secondary | ICD-10-CM | POA: Diagnosis not present

## 2021-02-24 DIAGNOSIS — R600 Localized edema: Secondary | ICD-10-CM | POA: Diagnosis not present

## 2021-02-24 DIAGNOSIS — N2581 Secondary hyperparathyroidism of renal origin: Secondary | ICD-10-CM | POA: Diagnosis not present

## 2021-02-24 DIAGNOSIS — I10 Essential (primary) hypertension: Secondary | ICD-10-CM | POA: Diagnosis not present

## 2021-02-24 DIAGNOSIS — R801 Persistent proteinuria, unspecified: Secondary | ICD-10-CM | POA: Diagnosis not present

## 2021-02-25 ENCOUNTER — Other Ambulatory Visit: Payer: Self-pay

## 2021-02-25 ENCOUNTER — Encounter: Payer: Self-pay | Admitting: Family Medicine

## 2021-02-25 ENCOUNTER — Ambulatory Visit (INDEPENDENT_AMBULATORY_CARE_PROVIDER_SITE_OTHER): Payer: Medicare Other | Admitting: Family Medicine

## 2021-02-25 VITALS — BP 147/58 | HR 56 | Temp 98.6°F | Resp 16 | Ht 70.0 in | Wt 238.0 lb

## 2021-02-25 DIAGNOSIS — J189 Pneumonia, unspecified organism: Secondary | ICD-10-CM | POA: Diagnosis not present

## 2021-02-25 DIAGNOSIS — Z23 Encounter for immunization: Secondary | ICD-10-CM | POA: Diagnosis not present

## 2021-02-25 DIAGNOSIS — R0789 Other chest pain: Secondary | ICD-10-CM | POA: Diagnosis not present

## 2021-02-25 DIAGNOSIS — K219 Gastro-esophageal reflux disease without esophagitis: Secondary | ICD-10-CM | POA: Diagnosis not present

## 2021-02-25 DIAGNOSIS — E119 Type 2 diabetes mellitus without complications: Secondary | ICD-10-CM | POA: Diagnosis not present

## 2021-02-25 DIAGNOSIS — R1012 Left upper quadrant pain: Secondary | ICD-10-CM

## 2021-02-25 DIAGNOSIS — E6609 Other obesity due to excess calories: Secondary | ICD-10-CM

## 2021-02-25 DIAGNOSIS — I25110 Atherosclerotic heart disease of native coronary artery with unstable angina pectoris: Secondary | ICD-10-CM

## 2021-02-25 DIAGNOSIS — I509 Heart failure, unspecified: Secondary | ICD-10-CM | POA: Diagnosis not present

## 2021-02-25 DIAGNOSIS — Z6834 Body mass index (BMI) 34.0-34.9, adult: Secondary | ICD-10-CM

## 2021-02-25 DIAGNOSIS — N184 Chronic kidney disease, stage 4 (severe): Secondary | ICD-10-CM

## 2021-02-25 MED ORDER — DOXYCYCLINE HYCLATE 100 MG PO TABS: 100.0000 mg | ORAL_TABLET | Freq: Two times a day (BID) | ORAL | 0 refills | Status: AC

## 2021-02-25 MED ORDER — PREDNISONE 10 MG PO TABS: 10.0000 mg | ORAL_TABLET | Freq: Every day | ORAL | 0 refills | Status: AC

## 2021-02-25 NOTE — Progress Notes (Signed)
Established patient visit   Patient: Ronald [name] [facility]   DOB: 01/11/1946   75 y.o. Male  MRN: [id number] Visit Date: 02/25/2021  Today's healthcare provider: [name], MD   Chief Complaint  Patient presents with   Follow-up   Subjective    HPI  Patient comes in today for follow-up of left lateral lower chest wall pain which seems to be worse with breathing.  Certain movements aggravate it.  CT of the chest and abdomen were negative.  No etiology found for this pleuritic type chest pain. No cough no weight loss no paroxysmal nocturnal dyspnea. Follow up for flank pain  The patient was last seen for this 1 months ago. Changes made at last visit include CT scan and labs were stable.   He reports good compliance with treatment. He feels that condition is Unchanged. He is not having side effects. Patient reports that he has not noticed much change in symptoms.      Medications: Outpatient Medications Prior to Visit  Medication Sig   ACETAMINOPHEN PO Take 650 mg by mouth. 2 at bedtime   allopurinol (ZYLOPRIM) 100 MG tablet Take 1 tablet (100 mg total) by mouth at bedtime.   amiodarone (PACERONE) 200 MG tablet TAKE 1 TABLET BY MOUTH  DAILY   amLODipine (NORVASC) 10 MG tablet TAKE 1 TABLET BY MOUTH EVERY DAY   apixaban (ELIQUIS) 5 MG TABS tablet Take 1 tablet (5 mg total) by mouth 2 (two) times daily.   atorvastatin (LIPITOR) 40 MG tablet Take 1 tablet (40 mg total) by mouth daily.   Cholecalciferol (VITAMIN D-3) 125 MCG (5000 UT) TABS Take 2,000 Units by mouth daily.    enalapril (VASOTEC) 20 MG tablet Take 20 mg by mouth daily.   furosemide (LASIX) 40 MG tablet Take 40 mg by mouth daily as needed for fluid or edema.    isosorbide mononitrate (IMDUR) 30 MG 24 hr tablet TAKE 1 TABLET BY MOUTH  DAILY   levothyroxine (SYNTHROID) 75 MCG tablet TAKE 1 TABLET (75 MCG TOTAL) BY MOUTH DAILY BEFORE BREAKFAST.   loratadine (CLARITIN) 10 MG tablet Take 10 mg by mouth daily as  needed for allergies.   benzonatate (TESSALON) 100 MG capsule Take 1 capsule (100 mg total) by mouth 2 (two) times daily as needed for cough. (Patient not taking: No sig reported)   doxycycline (VIBRA-TABS) 100 MG tablet Take 1 tablet (100 mg total) by mouth 2 (two) times daily. (Patient not taking: No sig reported)   magnesium oxide (MAG-OX) 400 (240 Mg) MG tablet TAKE 1 TABLET BY MOUTH TWICE A DAY   Melatonin 5 MG TABS Take 1 tablet by mouth at bedtime.   metoprolol tartrate (LOPRESSOR) 100 MG tablet TAKE 1 TABLET BY MOUTH  TWICE DAILY   Multiple Vitamin (MULTIVITAMIN PO) Take 1 tablet by mouth daily.   nitroGLYCERIN (NITROSTAT) 0.4 MG SL tablet Place 0.4 mg under the tongue every 5 (five) minutes as needed for chest pain (Up to 3 times).    pantoprazole (PROTONIX) 40 MG tablet TAKE 2 TABLETS BY MOUTH EVERY DAY   polyethylene glycol (MIRALAX / GLYCOLAX) 17 g packet Take 17 g by mouth daily.   sodium bicarbonate 650 MG tablet Take 650 mg by mouth 2 (two) times daily. Evening   No facility-administered medications prior to visit.    Review of Systems  Constitutional:  Negative for activity change and fatigue.  Respiratory:  Negative for cough and shortness of breath.  Cardiovascular:  Negative for chest pain, palpitations and leg swelling.  Gastrointestinal:  Negative for abdominal distention, abdominal pain, anal bleeding, blood in stool, constipation, diarrhea, nausea and vomiting.  Genitourinary:  Negative for dysuria and frequency.  Musculoskeletal:  Negative for arthralgias and myalgias.  Neurological:  Negative for dizziness, light-headedness and headaches.       Objective    BP (!) 147/58   Pulse (!) 56   Temp 98.6 F (37 C)   Resp 16   Ht 5\' 10"  (1.778 m)   Wt 238 lb (108 kg)   BMI 34.15 kg/m  BP Readings from Last 3 Encounters:  02/25/21 (!) 147/58  01/26/21 138/66  01/22/21 (!) 172/66   Wt Readings from Last 3 Encounters:  02/25/21 238 lb (108 kg)  01/26/21 234  lb (106.1 kg)  01/22/21 240 lb (108.9 kg)      Physical Exam Vitals reviewed.  Constitutional:      Appearance: Normal appearance.  HENT:     Head: Normocephalic and atraumatic.     Right Ear: External ear normal.     Left Ear: External ear normal.  Eyes:     General: No scleral icterus.    Conjunctiva/sclera: Conjunctivae normal.  Cardiovascular:     Rate and Rhythm: Normal rate and regular rhythm.     Pulses: Normal pulses.     Heart sounds: Normal heart sounds.  Pulmonary:     Effort: Pulmonary effort is normal.     Breath sounds: Normal breath sounds.  Abdominal:     Palpations: Abdomen is soft.     Comments: No CVAT.  Musculoskeletal:     Right lower leg: No edema.     Left lower leg: No edema.  Skin:    General: Skin is warm and dry.  Neurological:     General: No focal deficit present.     Mental Status: He is alert and oriented to person, place, and time.  Psychiatric:        Mood and Affect: Mood normal.        Behavior: Behavior normal.        Thought Content: Thought content normal.        Judgment: Judgment normal.      No results found for any visits on 02/25/21.  Assessment & Plan     1. Pneumonitis I can find no etiology for his chest wall pain other than pneumonitis or rib strain.  Limited with treatment due to his chronic kidney disease and his stage IV.  At this time treat with doxycycline daily for a week prednisone low-dose at 10 mg daily for a week - doxycycline (VIBRA-TABS) 100 MG tablet; Take 1 tablet (100 mg total) by mouth 2 (two) times daily for 7 days.  Dispense: 14 tablet; Refill: 0 - predniSONE (DELTASONE) 10 MG tablet; Take 1 tablet (10 mg total) by mouth daily with breakfast for 7 days.  Dispense: 7 tablet; Refill: 0  2. Chest wall pain As above - doxycycline (VIBRA-TABS) 100 MG tablet; Take 1 tablet (100 mg total) by mouth 2 (two) times daily for 7 days.  Dispense: 14 tablet; Refill: 0  3. LUQ pain See #1  4. Need for influenza  vaccination  - Flu Vaccine QUAD High Dose(Fluad)  5. Class 1 obesity due to excess calories with serious comorbidity and body mass index (BMI) of 34.0 to 34.9 in adult   6. Gastroesophageal reflux disease without esophagitis On chronic PPI  7. Type 2 diabetes  mellitus without complication, without long-term current use of insulin (HCC) Clinically stable  8. CKD (chronic kidney disease), stage IV ([place]) Followed by nephrology  9. Congestive heart failure, unspecified HF chronicity, unspecified heart failure type ([place]) Clinically stable and followed by cardiology  10. Coronary artery disease involving native coronary artery of native heart with unstable angina pectoris ([place]) All risk factors treated   No follow-ups on file.      I, [name], MD, have reviewed all documentation for this visit. The documentation on 02/27/21 for the exam, diagnosis, procedures, and orders are all accurate and complete.    Irma Roulhac Cranford Mon, MD  [facility] [phone] (phone) [phone] (fax)  [place]

## 2021-03-03 DIAGNOSIS — H353211 Exudative age-related macular degeneration, right eye, with active choroidal neovascularization: Secondary | ICD-10-CM | POA: Diagnosis not present

## 2021-03-03 LAB — HM DIABETES EYE EXAM

## 2021-03-05 DIAGNOSIS — N184 Chronic kidney disease, stage 4 (severe): Secondary | ICD-10-CM | POA: Diagnosis not present

## 2021-03-05 DIAGNOSIS — I10 Essential (primary) hypertension: Secondary | ICD-10-CM | POA: Diagnosis not present

## 2021-03-05 DIAGNOSIS — I701 Atherosclerosis of renal artery: Secondary | ICD-10-CM | POA: Diagnosis not present

## 2021-03-05 DIAGNOSIS — R600 Localized edema: Secondary | ICD-10-CM | POA: Diagnosis not present

## 2021-03-05 DIAGNOSIS — N2581 Secondary hyperparathyroidism of renal origin: Secondary | ICD-10-CM | POA: Diagnosis not present

## 2021-03-24 DIAGNOSIS — H353132 Nonexudative age-related macular degeneration, bilateral, intermediate dry stage: Secondary | ICD-10-CM | POA: Diagnosis not present

## 2021-03-25 ENCOUNTER — Other Ambulatory Visit: Payer: Self-pay

## 2021-03-25 ENCOUNTER — Ambulatory Visit (INDEPENDENT_AMBULATORY_CARE_PROVIDER_SITE_OTHER): Payer: Medicare Other | Admitting: Family Medicine

## 2021-03-25 ENCOUNTER — Encounter: Payer: Self-pay | Admitting: Family Medicine

## 2021-03-25 VITALS — BP 155/60 | HR 54 | Temp 98.2°F | Resp 18 | Ht 70.0 in | Wt 235.0 lb

## 2021-03-25 DIAGNOSIS — I480 Paroxysmal atrial fibrillation: Secondary | ICD-10-CM

## 2021-03-25 DIAGNOSIS — I420 Dilated cardiomyopathy: Secondary | ICD-10-CM | POA: Diagnosis not present

## 2021-03-25 DIAGNOSIS — N184 Chronic kidney disease, stage 4 (severe): Secondary | ICD-10-CM

## 2021-03-25 DIAGNOSIS — I25110 Atherosclerotic heart disease of native coronary artery with unstable angina pectoris: Secondary | ICD-10-CM

## 2021-03-25 DIAGNOSIS — R1084 Generalized abdominal pain: Secondary | ICD-10-CM

## 2021-03-25 DIAGNOSIS — E782 Mixed hyperlipidemia: Secondary | ICD-10-CM

## 2021-03-25 DIAGNOSIS — I714 Abdominal aortic aneurysm, without rupture, unspecified: Secondary | ICD-10-CM

## 2021-03-25 DIAGNOSIS — E1121 Type 2 diabetes mellitus with diabetic nephropathy: Secondary | ICD-10-CM

## 2021-03-25 NOTE — Progress Notes (Signed)
I,[name],acting as a scribe for [name], MD.,have documented all relevant documentation on the behalf of [name], MD,as directed by  [name], MD while in the presence of [name], MD.   Established patient visit   Patient: Ronald [name] [facility]   DOB: 1946-04-08   75 y.o. Male  MRN: [id number] Visit Date: 03/25/2021  Today's healthcare provider: [name], MD   Chief Complaint  Patient presents with   Follow-up   Abdominal Pain   Subjective    HPI  Patient feels as though he might be having a little bit less thoracic discomfort than on the last visit.  He is noticing some bruising on his abdomen.  He is not really having any abdominal pain.  He states his abdomen is always bothering him some however. He is probably going to start dialysis early next year. Follow up for Pneumonitis:  The patient was last seen for this 1 months ago. Changes made at last visit include; I can find no etiology for his chest wall pain other than pneumonitis or rib strain.  Limited with treatment due to his chronic kidney disease and his stage IV.  At this time treat with doxycycline daily for a week prednisone low-dose at 10 mg daily for a week.  He reports good compliance with treatment. He feels that condition is Unchanged. He is not having side effects. none  -----------------------------------------------------------------------------------------  Patient states he has seen no improvement since last office visit. Patient is still having abdominal pain. Patient states there is now unexplained bruising on his stomach.     Medications: Outpatient Medications Prior to Visit  Medication Sig   ACETAMINOPHEN PO Take 650 mg by mouth. 2 at bedtime   allopurinol (ZYLOPRIM) 100 MG tablet Take 1 tablet (100 mg total) by mouth at bedtime.   amiodarone (PACERONE) 200 MG tablet TAKE 1 TABLET BY MOUTH  DAILY   amLODipine (NORVASC) 10 MG tablet TAKE 1  TABLET BY MOUTH EVERY DAY   apixaban (ELIQUIS) 5 MG TABS tablet Take 1 tablet (5 mg total) by mouth 2 (two) times daily.   atorvastatin (LIPITOR) 40 MG tablet Take 1 tablet (40 mg total) by mouth daily.   Cholecalciferol (VITAMIN D-3) 125 MCG (5000 UT) TABS Take 2,000 Units by mouth daily.    enalapril (VASOTEC) 20 MG tablet Take 20 mg by mouth daily.   furosemide (LASIX) 40 MG tablet Take 40 mg by mouth daily as needed for fluid or edema.    isosorbide mononitrate (IMDUR) 30 MG 24 hr tablet TAKE 1 TABLET BY MOUTH  DAILY   levothyroxine (SYNTHROID) 75 MCG tablet TAKE 1 TABLET (75 MCG TOTAL) BY MOUTH DAILY BEFORE BREAKFAST.   loratadine (CLARITIN) 10 MG tablet Take 10 mg by mouth daily as needed for allergies.   magnesium oxide (MAG-OX) 400 (240 Mg) MG tablet TAKE 1 TABLET BY MOUTH TWICE A DAY   Melatonin 5 MG TABS Take 1 tablet by mouth at bedtime.   metoprolol tartrate (LOPRESSOR) 100 MG tablet TAKE 1 TABLET BY MOUTH  TWICE DAILY   Multiple Vitamin (MULTIVITAMIN PO) Take 1 tablet by mouth daily.   nitroGLYCERIN (NITROSTAT) 0.4 MG SL tablet Place 0.4 mg under the tongue every 5 (five) minutes as needed for chest pain (Up to 3 times).    pantoprazole (PROTONIX) 40 MG tablet TAKE 2 TABLETS BY MOUTH EVERY DAY   polyethylene glycol (MIRALAX / GLYCOLAX) 17 g packet Take 17 g  by mouth daily.   sodium bicarbonate 650 MG tablet Take 650 mg by mouth 2 (two) times daily. Evening   [DISCONTINUED] benzonatate (TESSALON) 100 MG capsule Take 1 capsule (100 mg total) by mouth 2 (two) times daily as needed for cough. (Patient not taking: Reported on 03/25/2021)   No facility-administered medications prior to visit.    Review of Systems  Constitutional:  Negative for appetite change, chills and fever.  Respiratory:  Negative for chest tightness, shortness of breath and wheezing.   Cardiovascular:  Negative for chest pain and palpitations.  Gastrointestinal:  Negative for abdominal pain, nausea and vomiting.        Objective    BP (!) 155/60 (BP Location: Left Arm, Patient Position: Sitting, Cuff Size: Large)   Pulse (!) 54   Temp 98.2 F (36.8 C) (Temporal)   Resp 18   Ht 5\' 10"  (1.778 m)   Wt 235 lb (106.6 kg)   SpO2 99%   BMI 33.72 kg/m  BP Readings from Last 3 Encounters:  03/25/21 (!) 155/60  02/25/21 (!) 147/58  01/26/21 138/66   Wt Readings from Last 3 Encounters:  03/25/21 235 lb (106.6 kg)  02/25/21 238 lb (108 kg)  01/26/21 234 lb (106.1 kg)      Physical Exam Vitals reviewed.  Constitutional:      Appearance: Normal appearance.  HENT:     Head: Normocephalic and atraumatic.     Right Ear: External ear normal.     Left Ear: External ear normal.  Eyes:     General: No scleral icterus.    Conjunctiva/sclera: Conjunctivae normal.  Cardiovascular:     Rate and Rhythm: Normal rate and regular rhythm.     Pulses: Normal pulses.     Heart sounds: Normal heart sounds.  Pulmonary:     Effort: Pulmonary effort is normal.     Breath sounds: Normal breath sounds.  Abdominal:     General: Abdomen is protuberant.     Palpations: Abdomen is soft.     Tenderness: There is no abdominal tenderness.     Comments: No CVAT.  Abdomen is protuberant.  This is a normal finding in.  He does have some spots of bruising on his right right abdominal wall and mid abdomen.    Musculoskeletal:     Right lower leg: No edema.     Left lower leg: No edema.  Skin:    General: Skin is warm and dry.     Comments: 4-5 small olive size bruises on abdomen.  Neurological:     General: No focal deficit present.     Mental Status: He is alert and oriented to person, place, and time.  Psychiatric:        Mood and Affect: Mood normal.        Behavior: Behavior normal.        Thought Content: Thought content normal.        Judgment: Judgment normal.      No results found for any visits on 03/25/21.  Assessment & Plan     1. Dilated cardiomyopathy ([place]) Followed by cardiology  regularly  2. Coronary artery disease involving native coronary artery of native heart with unstable angina pectoris ([place]) All risk factors treated  3. Paroxysmal atrial fibrillation (HCC) On Eliquis  4. 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 Mixed hyperlipidemia On atorvastatin  7. Generalized abdominal pain Patient  has a protuberant abdomen, I think the small bruises are from small amounts of trauma to the abdomen as he moves about.  Abdominal exam is completely benign for any tenderness or mass of  8. CKD (chronic kidney disease), stage IV ([place]) Planning for dialysis for 2023 Last GFR was 17  No follow-ups on file.      I, [name], MD, have reviewed all documentation for this visit. The documentation on 03/26/21 for the exam, diagnosis, procedures, and orders are all accurate and complete.    Bertil Brickey Cranford Mon, MD  [facility] [phone] (phone) [phone] (fax)  [place]

## 2021-03-27 ENCOUNTER — Other Ambulatory Visit: Payer: Self-pay | Admitting: Family Medicine

## 2021-03-27 DIAGNOSIS — K219 Gastro-esophageal reflux disease without esophagitis: Secondary | ICD-10-CM

## 2021-03-28 NOTE — Telephone Encounter (Signed)
Requested Prescriptions  Pending Prescriptions Disp Refills  . pantoprazole (PROTONIX) 40 MG tablet [Pharmacy Med Name: Pantoprazole Sodium 40 MG Oral Tablet Delayed Release] 180 tablet 1    Sig: TAKE 2 TABLETS BY MOUTH  DAILY     Gastroenterology: Proton Pump Inhibitors Passed - 03/27/2021 11:07 PM      Passed - Valid encounter within last 12 months    Recent Outpatient Visits          3 days ago Dilated cardiomyopathy [facility])   [facility] [name]., MD   1 month ago Pneumonitis   [facility] [name]., MD   2 months ago Type 2 diabetes mellitus without complication, without long-term current use of insulin [facility])   [facility] [name]., MD   3 months ago Sprain of costal cartilage, initial encounter   [facility] [name]., MD   4 months ago COVID-19   [facility] Charlynne Cousins, MD      Future Appointments            In 4 months [name], Retia Passe., MD [facility], [place]

## 2021-04-29 ENCOUNTER — Encounter: Payer: Self-pay | Admitting: Family Medicine

## 2021-05-05 ENCOUNTER — Other Ambulatory Visit: Payer: Self-pay | Admitting: Family Medicine

## 2021-05-07 ENCOUNTER — Telehealth: Payer: Self-pay

## 2021-05-07 NOTE — Progress Notes (Signed)
Chronic Care Management Pharmacy Assistant   Name: Ronald [name] [facility]  MRN: [id number] DOB: November 30, 1945  Reason for Encounter: Hypertension Disease State Call   Recent office visits:  03/25/2021 Miguel Aschoff, MD (PCP Office Visit) for Follow-up- Stopped: Benzonatate 100 mg due to patient not taking, No orders placed, no follow-up noted  02/25/2021 Miguel Aschoff, MD (PCP Office Visit) for Follow-up- Started: Prednisone 10 mg daily, no orders placed, no follow-up noted  Recent consult visits:  03/05/2021 Murlean Iba, MD (Nephrology) for Follow-up-No medication changes noted, lab orders placed, patient instructed to return in 3 months  Hospital visits:  None in previous 6 months  Medications: Outpatient Encounter Medications as of 05/07/2021  Medication Sig   ACETAMINOPHEN PO Take 650 mg by mouth. 2 at bedtime   allopurinol (ZYLOPRIM) 100 MG tablet Take 1 tablet (100 mg total) by mouth at bedtime.   amiodarone (PACERONE) 200 MG tablet TAKE 1 TABLET BY MOUTH  DAILY   amLODipine (NORVASC) 10 MG tablet TAKE 1 TABLET BY MOUTH EVERY DAY   apixaban (ELIQUIS) 5 MG TABS tablet Take 1 tablet (5 mg total) by mouth 2 (two) times daily.   atorvastatin (LIPITOR) 40 MG tablet Take 1 tablet (40 mg total) by mouth daily.   Cholecalciferol (VITAMIN D-3) 125 MCG (5000 UT) TABS Take 2,000 Units by mouth daily.    enalapril (VASOTEC) 20 MG tablet Take 20 mg by mouth daily.   furosemide (LASIX) 40 MG tablet Take 40 mg by mouth daily as needed for fluid or edema.    isosorbide mononitrate (IMDUR) 30 MG 24 hr tablet TAKE 1 TABLET BY MOUTH  DAILY   levothyroxine (SYNTHROID) 75 MCG tablet TAKE 1 TABLET (75 MCG TOTAL) BY MOUTH DAILY BEFORE BREAKFAST.   loratadine (CLARITIN) 10 MG tablet Take 10 mg by mouth daily as needed for allergies.   magnesium oxide (MAG-OX) 400 (240 Mg) MG tablet TAKE 1 TABLET BY MOUTH TWICE A DAY   Melatonin 5 MG TABS Take 1 tablet by mouth at bedtime.   metoprolol tartrate  (LOPRESSOR) 100 MG tablet TAKE 1 TABLET BY MOUTH  TWICE DAILY   Multiple Vitamin (MULTIVITAMIN PO) Take 1 tablet by mouth daily.   nitroGLYCERIN (NITROSTAT) 0.4 MG SL tablet Place 0.4 mg under the tongue every 5 (five) minutes as needed for chest pain (Up to 3 times).    pantoprazole (PROTONIX) 40 MG tablet TAKE 2 TABLETS BY MOUTH  DAILY   polyethylene glycol (MIRALAX / GLYCOLAX) 17 g packet Take 17 g by mouth daily.   sodium bicarbonate 650 MG tablet Take 650 mg by mouth 2 (two) times daily. Evening   No facility-administered encounter medications on file as of 05/07/2021.   Care Gaps: Zoster Vaccine COVID-19 Vaccine Booster 3 Colonoscopy (last completed 11/18/2016) Insurance only covers every 10 years unless patient is high risk Diabetic Eye Exam BP > 140/90 Last A1C was 01/22/2021  Star Rating Drugs: Enalapril 20 mg last filled on 02/18/2021 for a 90-Day supply with [facility] Atorvastatin 40 mg last filled on 02/27/2021 for a 90-Day supply with [facility]  Reviewed chart prior to disease state call. Spoke with patient regarding BP  Recent Office Vitals: BP Readings from Last 3 Encounters:  03/25/21 (!) 155/60  02/25/21 (!) 147/58  01/26/21 138/66   Pulse Readings from Last 3 Encounters:  03/25/21 (!) 54  02/25/21 (!) 56  01/26/21 (!) 54    Wt Readings from Last 3 Encounters:  03/25/21 235 lb (106.6 kg)  02/25/21 238 lb (108 kg)  01/26/21 234 lb (106.1 kg)     Kidney Function Lab Results  Component Value Date/Time   CREATININE 3.21 (H) 01/22/2021 04:18 PM   CREATININE 2.46 (H) 03/20/2020 10:38 AM   CREATININE 2.44 (H) 11/24/2015 03:46 PM   CREATININE 2.17 (H) 10/20/2015 02:18 PM   GFRNONAA 25 (L) 03/20/2020 10:38 AM   GFRAA 28 (L) 03/20/2020 10:38 AM    BMP Latest Ref Rng & Units 01/22/2021 03/20/2020 10/10/2019  Glucose 65 - 99 mg/dL 121(H) 124(H) 122(H)  BUN 8 - 27 mg/dL 41(H) 29(H) 28(H)  Creatinine 0.76 - 1.27 mg/dL 3.21(H) 2.46(H) 2.67(H)  BUN/Creat  Ratio 10 - 24 13 12 10   Sodium 134 - 144 mmol/L 142 145(H) 144  Potassium 3.5 - 5.2 mmol/L 5.3(H) 4.8 5.3(H)  Chloride 96 - 106 mmol/L 105 110(H) 109(H)  CO2 20 - 29 mmol/L 21 19(L) 22  Calcium 8.6 - 10.2 mg/dL 9.4 9.1 9.5    Current antihypertensive regimen:  Amlodipine 10 mg 1 tablet daily Isosorbide Mononitrate 30 mg 1 tablet daily Enalapril 20 mg 1 tablet daily Metoprolol tartrate 100 mg 1 tablet twice daily Amiodarone 200 mg 1 tablet daily  What recent interventions/DTPs have been made by any provider to improve Blood Pressure control since last CPP Visit: None ID  Adherence Review: Is the patient currently on ACE/ARB medication? Yes Does the patient have >5 day gap between last estimated fill dates? No  Patient has a telephone appointment with CPP on 06/09/2021 @ 1500  I have attempted without success to contact this patient by phone three times to do his Hypertension Disease State call. I left a Voice message for patient to return my call.  12/13 LVM requesting patient to return my call 12/19 LVM requesting patient to return my call 12/28 LVM requesting patent to return my call  Lynann Bologna, [place] Pharmacist Assistant Phone: [phone]

## 2021-05-18 ENCOUNTER — Telehealth: Payer: Self-pay

## 2021-05-18 NOTE — Telephone Encounter (Signed)
Pt Asst application has been completed and placed in Dr Alyse Low box for signature.

## 2021-05-26 DIAGNOSIS — H353132 Nonexudative age-related macular degeneration, bilateral, intermediate dry stage: Secondary | ICD-10-CM | POA: Diagnosis not present

## 2021-05-27 ENCOUNTER — Other Ambulatory Visit: Payer: Self-pay | Admitting: Family Medicine

## 2021-06-02 DIAGNOSIS — N2581 Secondary hyperparathyroidism of renal origin: Secondary | ICD-10-CM | POA: Diagnosis not present

## 2021-06-02 DIAGNOSIS — N184 Chronic kidney disease, stage 4 (severe): Secondary | ICD-10-CM | POA: Diagnosis not present

## 2021-06-02 DIAGNOSIS — I10 Essential (primary) hypertension: Secondary | ICD-10-CM | POA: Diagnosis not present

## 2021-06-08 ENCOUNTER — Telehealth: Payer: Self-pay

## 2021-06-08 DIAGNOSIS — N184 Chronic kidney disease, stage 4 (severe): Secondary | ICD-10-CM | POA: Diagnosis not present

## 2021-06-08 DIAGNOSIS — R801 Persistent proteinuria, unspecified: Secondary | ICD-10-CM | POA: Diagnosis not present

## 2021-06-08 DIAGNOSIS — N2581 Secondary hyperparathyroidism of renal origin: Secondary | ICD-10-CM | POA: Diagnosis not present

## 2021-06-08 DIAGNOSIS — R600 Localized edema: Secondary | ICD-10-CM | POA: Diagnosis not present

## 2021-06-08 DIAGNOSIS — I10 Essential (primary) hypertension: Secondary | ICD-10-CM | POA: Diagnosis not present

## 2021-06-08 DIAGNOSIS — I701 Atherosclerosis of renal artery: Secondary | ICD-10-CM | POA: Diagnosis not present

## 2021-06-08 NOTE — Progress Notes (Signed)
° ° °  Chronic Care Management Pharmacy Assistant   Name: Franky Reier [facility]  MRN: [id number] DOB: 1945/08/09  Patient called to be reminded of his telephone appointment with Junius Argyle, CPP on 06/09/2021 @ 1500  No answer, left message of appointment date, time and type of appointment (either telephone or in person). Left message to have all medications, supplements, blood pressure and/or blood sugar logs available during appointment and to return call if need to reschedule.  Star Rating Drug: Atorvastatin 40 mg last filled on 02/27/2021 for a 90-Day supply with [facility] Enalapril last filled on 05/05/2021 for a 90-Day supply with [facility]  Any gaps in medications fill history? No  Care Gaps: Zoster Vaccine COVID-19 Vaccine Colonoscopy (last completed 11/18/2016) most insurance only cover every 10 years unless high risk BP > 140/90     Lynann Bologna, CPA/CMA Catering manager Phone: [phone]

## 2021-06-09 ENCOUNTER — Ambulatory Visit (INDEPENDENT_AMBULATORY_CARE_PROVIDER_SITE_OTHER): Payer: Medicare Other

## 2021-06-09 DIAGNOSIS — I10 Essential (primary) hypertension: Secondary | ICD-10-CM

## 2021-06-09 DIAGNOSIS — I480 Paroxysmal atrial fibrillation: Secondary | ICD-10-CM

## 2021-06-09 NOTE — Progress Notes (Signed)
Chronic Care Management Pharmacy Note  06/15/2021 Name:  [name] MRN:  [id number] DOB:  [birth year]  Summary: Today's visit was conducted with patient's spouse Ronald [name] for CCM follow-up. She reports patient has been doing well with his medications. Denies affordability concerns at the moment with Eliquis.   Recommendations/Changes made from today's visit: Continue current medications  Plan: CPP follow-up in 6 months.   Subjective: Ronald [name] is an 76 y.o. year old male who is a primary patient of [name]., MD.  The CCM team was consulted for assistance with disease management and care coordination needs.    Engaged with patient by telephone for follow up visit in response to provider referral for pharmacy case management and/or care coordination services.   Consent to Services:  The patient was given information about Chronic Care Management services, agreed to services, and gave verbal consent prior to initiation of services.  Please see initial visit note for detailed documentation.   Patient Care Team: [name]., MD as PCP - General (Unknown Physician Specialty) Burnell Blanks, MD as PCP - Cardiology (Cardiology) Murlean Iba, MD as Consulting Physician (Internal Medicine) Eulogio Bear, MD as Consulting Physician (Ophthalmology) Margaretha Sheffield, MD (Otolaryngology) Germaine Pomfret, [facility] (Pharmacist)  Recent office visits: 11/03/20: Patient presented to Dr. [name] for follow-up.   Recent consult visits: 08/27/20: Patient presented to Dr. Merita Norton (Nephrology) for follow-up.   Hospital visits: None in previous 6 months   Objective:  Lab Results  Component Value Date   CREATININE 3.21 (H) 01/22/2021   BUN 41 (H) 01/22/2021   GFRNONAA 25 (L) 03/20/2020   GFRAA 28 (L) 03/20/2020   NA 142 01/22/2021   K 5.3 (H) 01/22/2021   CALCIUM 9.4 01/22/2021   CO2 21 01/22/2021   GLUCOSE 121 (H) 01/22/2021    Lab Results   Component Value Date/Time   HGBA1C 6.0 (H) 01/22/2021 04:18 PM   HGBA1C 5.8 (A) 11/03/2020 02:40 PM   HGBA1C 5.8 (A) 06/30/2020 01:46 PM   HGBA1C 5.9 (H) 03/20/2020 10:38 AM   HGBA1C 6.2 09/26/2015 12:00 AM   MICROALBUR 50 04/30/2019 03:14 PM   MICROALBUR 100 01/11/2017 04:43 PM    Last diabetic Eye exam:  Lab Results  Component Value Date/Time   HMDIABEYEEXA No Retinopathy 03/03/2021 12:00 AM    Last diabetic Foot exam: No results found for: HMDIABFOOTEX   Lab Results  Component Value Date   CHOL 147 03/20/2020   HDL 52 03/20/2020   LDLCALC 80 03/20/2020   TRIG 80 03/20/2020   CHOLHDL 2.8 03/20/2020    Hepatic Function Latest Ref Rng & Units 01/22/2021 03/20/2020 10/10/2019  Total Protein 6.0 - 8.5 g/dL 6.3 5.8(L) -  Albumin 3.7 - 4.7 g/dL 4.3 4.1 4.2  AST 0 - 40 IU/L 20 21 -  ALT 0 - 44 IU/L 17 24 -  Alk Phosphatase 44 - 121 IU/L 155(H) 163(H) -  Total Bilirubin 0.0 - 1.2 mg/dL 0.9 0.8 -  Bilirubin, Direct 0.0 - 0.3 mg/dL - - -    Lab Results  Component Value Date/Time   TSH 2.680 01/22/2021 04:18 PM   TSH 5.330 (H) 03/20/2020 10:38 AM    CBC Latest Ref Rng & Units 01/22/2021 03/20/2020 05/01/2019  WBC 3.4 - 10.8 x10E3/uL 5.7 5.7 6.5  Hemoglobin 13.0 - 17.7 g/dL 11.7(L) 11.9(L) 12.6(L)  Hematocrit 37.5 - 51.0 % 35.9(L) 37.8 38.8  Platelets 150 - 450 x10E3/uL 161 128(L) 147(L)  No results found for: VD25OH  Clinical ASCVD: Yes  The ASCVD Risk score (Arnett DK, et al., 2019) failed to calculate for the following reasons:   The patient has a prior MI or stroke diagnosis    Depression screen [facility] 2/9 11/03/2020 04/28/2020 03/19/2020  Decreased Interest 0 0 0  Down, Depressed, Hopeless 0 0 0  PHQ - 2 Score 0 0 0  Altered sleeping 3 - 3  Tired, decreased energy 3 - 3  Change in appetite 0 - 0  Feeling bad or failure about yourself  0 - 3  Trouble concentrating 0 - 0  Moving slowly or fidgety/restless 0 - 0  Suicidal thoughts 0 - 0  PHQ-9 Score 6 - 9   Difficult doing work/chores Somewhat difficult - Extremely dIfficult  Some recent data might be hidden    Social History   Tobacco Use  Smoking Status Former   Packs/day: 3.00   Years: 20.00   Pack years: 60.00   Types: Cigarettes   Quit date: 07/25/1986   Years since quitting: 34.9  Smokeless Tobacco Never   BP Readings from Last 3 Encounters:  03/25/21 (!) 155/60  02/25/21 (!) 147/58  01/26/21 138/66   Pulse Readings from Last 3 Encounters:  03/25/21 (!) 54  02/25/21 (!) 56  01/26/21 (!) 54   Wt Readings from Last 3 Encounters:  03/25/21 235 lb (106.6 kg)  02/25/21 238 lb (108 kg)  01/26/21 234 lb (106.1 kg)   BMI Readings from Last 3 Encounters:  03/25/21 33.72 kg/m  02/25/21 34.15 kg/m  01/26/21 34.56 kg/m    Assessment/Interventions: Review of patient past medical history, allergies, medications, health status, including review of consultants reports, laboratory and other test data, was performed as part of comprehensive evaluation and provision of chronic care management services.   SDOH:  (Social Determinants of Health) assessments and interventions performed: Yes SDOH Interventions    Flowsheet Row Most Recent Value  SDOH Interventions   Financial Strain Interventions Intervention Not Indicated       SDOH Screenings   Alcohol Screen: Low Risk    Last Alcohol Screening Score (AUDIT): 0  Depression (PHQ2-9): Medium Risk   PHQ-2 Score: 6  Financial Resource Strain: Low Risk    Difficulty of Paying Living Expenses: Not hard at all  Food Insecurity: Not on file  Housing: Not on file  Physical Activity: Not on file  Social Connections: Not on file  Stress: Not on file  Tobacco Use: Medium Risk   Smoking Tobacco Use: Former   Smokeless Tobacco Use: Never   Passive Exposure: Not on file  Transportation Needs: Not on file    CCM Care Plan  Allergies  Allergen Reactions   Predicort [Prednisolone] Other (See Comments)    Stomach pain    Ciprofloxacin     GI upset   Hydrochlorothiazide Other (See Comments)    Dehydration   Hydrocodone     Stomach upset   Hydrocodone-Acetaminophen     Stomach upset   Hydrocodone-Acetaminophen Nausea Only   Loratadine Other (See Comments)    Other reaction(s): Unknown   Sulfa Antibiotics Other (See Comments)    Cannot recall   Sulfacetamide Sodium Other (See Comments)    Cannot recall   Sulfasalazine     Other reaction(s): Other (See Comments) Cannot recall   Hydrocodone-Acetaminophen Nausea Only   Penicillins Hives and Rash    Has patient had a PCN reaction causing immediate rash, facial/tongue/throat swelling, SOB or lightheadedness with hypotension: YES  Has patient had a PCN reaction causing severe rash involving mucus membranes or skin necrosis: NO Has patient had a PCN reaction that required hospitalization NO Has patient had a PCN reaction occurring within the last 10 years:NO If all of the above answers are "NO", then may proceed with Cephalosporin use.    Medications Reviewed Today     Reviewed by Julieta Bellini, CMA (Certified Medical Assistant) on 03/25/21 at 1439  Med List Status: <None>   Medication Order Taking? Sig Documenting Provider Last Dose Status Informant  ACETAMINOPHEN PO [id number] Yes Take 650 mg by mouth. 2 at bedtime [provider] Taking Active   allopurinol (ZYLOPRIM) 100 MG tablet [id number] Yes Take 1 tablet (100 mg total) by mouth at bedtime. [name]., MD Taking Active   amiodarone (PACERONE) 200 MG tablet [id number] Yes TAKE 1 TABLET BY MOUTH  DAILY Burnell Blanks, MD Taking Active   amLODipine (NORVASC) 10 MG tablet [id number] Yes TAKE 1 TABLET BY MOUTH EVERY DAY [name]., MD Taking Active   apixaban (ELIQUIS) 5 MG TABS tablet [id number] Yes Take 1 tablet (5 mg total) by mouth 2 (two) times daily. Burnell Blanks, MD Taking Active   atorvastatin (LIPITOR) 40 MG tablet [id number] Yes Take 1 tablet (40  mg total) by mouth daily. [name]., MD Taking Active   Cholecalciferol (VITAMIN D-3) 125 MCG (5000 UT) TABS [id number] Yes Take 2,000 Units by mouth daily.  [provider] Taking Active Self  enalapril (VASOTEC) 20 MG tablet [id number] Yes Take 20 mg by mouth daily. [provider] Taking Active   furosemide (LASIX) 40 MG tablet [id number] Yes Take 40 mg by mouth daily as needed for fluid or edema.  [provider] Taking Active Spouse/Significant Other  isosorbide mononitrate (IMDUR) 30 MG 24 hr tablet [id number] Yes TAKE 1 TABLET BY MOUTH  DAILY Burnell Blanks, MD Taking Active   levothyroxine (SYNTHROID) 75 MCG tablet [id number] Yes TAKE 1 TABLET (75 MCG TOTAL) BY MOUTH DAILY BEFORE BREAKFAST. [name]., MD Taking Active   loratadine (CLARITIN) 10 MG tablet [id number] Yes Take 10 mg by mouth daily as needed for allergies. [provider] Taking Active Spouse/Significant Other  magnesium oxide (MAG-OX) 400 (240 Mg) MG tablet [id number] Yes TAKE 1 TABLET BY MOUTH TWICE A DAY [name]., MD Taking Active   Melatonin 5 MG TABS [id number] Yes Take 1 tablet by mouth at bedtime. [provider] Taking Active Spouse/Significant Other  metoprolol tartrate (LOPRESSOR) 100 MG tablet [id number] Yes TAKE 1 TABLET BY MOUTH  TWICE DAILY Burnell Blanks, MD Taking Active   Multiple Vitamin (MULTIVITAMIN PO) [id number] Yes Take 1 tablet by mouth daily. [provider] Taking Active Spouse/Significant Other  nitroGLYCERIN (NITROSTAT) 0.4 MG SL tablet [id number] Yes Place 0.4 mg under the tongue every 5 (five) minutes as needed for chest pain (Up to 3 times).  [provider] Taking Active Spouse/Significant Other  pantoprazole (PROTONIX) 40 MG tablet [id number] Yes TAKE 2 TABLETS BY MOUTH EVERY DAY [name]., MD Taking Active   polyethylene glycol (MIRALAX / GLYCOLAX) 17 g packet [id number] Yes Take  17 g by mouth daily. [provider] Taking Active   sodium bicarbonate 650 MG tablet [id number] Yes Take 650 mg by mouth 2 (two) times daily. 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   Bursitis of hip 12/18/2018   Pain in lower limb 12/18/2018   A-fib ([place]) 06/10/2018   CKD (chronic kidney disease), stage IV ([place]) 07/29/2017   Obesity 07/29/2017   Unstable angina ([place]) 07/27/2017   Atherosclerosis of native arteries of extremity with intermittent claudication ([place]) 06/25/2016   Dilated cardiomyopathy ([place]) 10/07/2015   Coronary artery disease involving native coronary artery of native heart with unstable angina pectoris (HCC)    Allergic rhinitis 08/19/2015   NSTEMI (non-ST elevated myocardial infarction) ([place]) 12/13/2014   AA (aortic aneurysm) ([place]) 10/04/2014   Arteriosclerosis of coronary artery 10/04/2014   Diabetes mellitus, type 2 ([place]) 10/04/2014   Acid reflux 10/04/2014   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   HLD (hyperlipidemia) 10/04/2014   BP (high blood pressure) 10/04/2014   Osteoarthrosis 10/04/2014   Adult BMI 30+ 10/04/2014   Basal cell papilloma 10/04/2014   Hypertensive heart disease without CHF 12/30/2012   Genital candidiasis in male 10/25/2012   Urge incontinence of urine 10/24/2012   Benign prostatic hyperplasia with urinary obstruction 10/24/2012   Incomplete emptying of bladder 10/24/2012   Other obstructive and reflux uropathy 10/24/2012   Abdominal pain 11/09/2010   Shortness of breath 11/12/2009   Renal artery stenosis     Mixed hyperlipidemia 09/04/2008   History of redo bypass grafting 09/04/2008   Diabetes mellitus with nephropathy ([place])     Immunization History  Administered Date(s) Administered    Fluad Quad(high Dose 65+) 03/01/2019, 03/19/2020, 02/25/2021   Influenza, High Dose Seasonal PF 03/18/2015, 03/02/2016, 02/13/2018   Influenza-Unspecified 03/14/2017   Moderna Sars-Covid-2 Vaccination 07/28/2019, 08/25/2019   Pneumococcal Conjugate-13 12/25/2013   Pneumococcal Polysaccharide-23 03/04/2009   Td 10/10/2003, 04/16/2016    Conditions to be addressed/monitored:  Hypertension, Atrial Fibrillation, Coronary Artery Disease, GERD, Chronic Kidney Disease, and Allergic Rhinitis  Care Plan : General Pharmacy (Adult)  Updates made by Germaine Pomfret, RPH since 06/15/2021 12:00 AM     Problem: Hypertension, Atrial Fibrillation, Coronary Artery Disease, GERD, Chronic Kidney Disease, and Allergic Rhinitis   Priority: High     Long-Range Goal: Patient-Specific Goal   Start Date: 12/12/2020  Expected End Date: 06/13/2021  This Visit's Progress: On track  Recent Progress: On track  Priority: High  Note:   Current Barriers:  Unable to independently monitor therapeutic efficacy  Pharmacist Clinical Goal(s):  Patient will maintain control of blood pressure as evidenced by BP less than 140/90  through collaboration with PharmD and provider.   Interventions: 1:1 collaboration with [name]., MD regarding development and update of comprehensive plan of care as evidenced by provider attestation and co-signature Inter-disciplinary care team collaboration (see longitudinal plan of care) Comprehensive medication review performed; medication list updated in electronic medical record  Hypertension (BP goal <140/90) -Controlled -Current treatment: Amlodipine 10 mg daily  Enalapril 20 mg daily  Furosemide 40 mg daily as needed  Imdur 30 mg daily  Metoprolol 100 mg twice daily  -Medications previously tried: NA  -Current home readings: 140/78  -Denies hypotensive/hypertensive symptoms -Educated on Daily salt intake goal < 2300 mg; -Counseled to monitor BP at home 2-3  times weekly, document, and provide log at future appointments -Recommended to continue current medication  Atrial Fibrillation (Goal: prevent stroke and major bleeding) -Controlled -CHADSVASC: 3 -Current treatment: Rate control: Amiodarone 200 mg daily  Metoprolol tartrate 100 mg twice  daily  Anticoagulation: Eliquis 5 mg twice daily  -Medications previously tried: NA -Counseled on avoidance of NSAIDs due to increased bleeding risk with anticoagulants; -Recommended to continue current medication  Hyperlipidemia: (LDL goal < 70) -Controlled -History of NSTEMI -Current treatment: Atorvastatin 40 mg daily  -Medications previously tried: NA  -Educated on Strategies to manage statin-induced myalgias; -Recommended to continue current medication  Hypothyroidism (Goal: Maintain stable thyroid function) -Controlled -Current treatment  Levothyroxine 75 mcg daily before breakfast  -Medications previously tried: NA  -Counseled on diet and exercise extensively  Chronic Kidney Disease Stage 4  -All medications assessed for renal dosing and appropriateness in chronic kidney disease. -Recommended to continue current medication   Patient Goals/Self-Care Activities Patient will:  - check blood pressure 2-3 times weekly, document, and provide at future appointments  Follow Up Plan: Telephone follow up appointment with care management team member scheduled for:  12/07/2021 at 3:00 PM       Medication Assistance: None required.  Patient affirms current coverage meets needs.  Compliance/Adherence/Medication fill history: Care Gaps: Covid Booster Ophthalmology Exam   Star-Rating Drugs: Atorvastatin 40 mg last filled on 11/28/2020 for 90 day supply at [facility].  Patient's preferred pharmacy [facility]:  [id number] [place] [address] [place]Phone: [phone]: [phone] [facility] [place]  [address] [address]Phone: [phone]: [phone]  Uses pill box? Yes Pt endorses 100% compliance  We discussed: Current pharmacy is preferred with insurance plan and patient is satisfied with pharmacy services Patient decided to: Continue current medication management strategy  Care Plan and Follow Up Patient Decision:  Patient agrees to Care Plan and Follow-up.  Plan: Telephone follow up appointment with care management team member scheduled for:  12/07/2021 at 3:00 PM  AJunius Argyle PharmD, BPara March CPalmyraPharmacist Practitioner  [phone]

## 2021-06-15 NOTE — Patient Instructions (Signed)
Visit Information It was great speaking with you today!  Please let me know if you have any questions about our visit.   Goals Addressed             This Visit's Progress    Track and Manage My Blood Pressure-Hypertension   On track    Timeframe:  Long-Range Goal Priority:  High Start Date: 12/11/2020                             Expected End Date:  12/11/2021                      Follow Up within 90 days   - check blood pressure 3 times per week    Why is this important?   You won't feel high blood pressure, but it can still hurt your blood vessels.  High blood pressure can cause heart or kidney problems. It can also cause a stroke.  Making lifestyle changes like losing a little weight or eating less salt will help.  Checking your blood pressure at home and at different times of the day can help to control blood pressure.  If the doctor prescribes medicine remember to take it the way the doctor ordered.  Call the office if you cannot afford the medicine or if there are questions about it.     Notes:         Patient Care Plan: General Pharmacy (Adult)     Problem Identified: Hypertension, Atrial Fibrillation, Coronary Artery Disease, GERD, Chronic Kidney Disease, and Allergic Rhinitis   Priority: High     Long-Range Goal: Patient-Specific Goal   Start Date: 12/12/2020  Expected End Date: 06/13/2021  This Visit's Progress: On track  Recent Progress: On track  Priority: High  Note:   Current Barriers:  Unable to independently monitor therapeutic efficacy  Pharmacist Clinical Goal(s):  Patient will maintain control of blood pressure as evidenced by BP less than 140/90  through collaboration with PharmD and provider.   Interventions: 1:1 collaboration with [name]., MD regarding development and update of comprehensive plan of care as evidenced by provider attestation and co-signature Inter-disciplinary care team collaboration (see longitudinal plan of  care) Comprehensive medication review performed; medication list updated in electronic medical record  Hypertension (BP goal <140/90) -Controlled -Current treatment: Amlodipine 10 mg daily  Enalapril 20 mg daily  Furosemide 40 mg daily as needed  Imdur 30 mg daily  Metoprolol 100 mg twice daily  -Medications previously tried: NA  -Current home readings: 140/78  -Denies hypotensive/hypertensive symptoms -Educated on Daily salt intake goal < 2300 mg; -Counseled to monitor BP at home 2-3 times weekly, document, and provide log at future appointments -Recommended to continue current medication  Atrial Fibrillation (Goal: prevent stroke and major bleeding) -Controlled -CHADSVASC: 3 -Current treatment: Rate control: Amiodarone 200 mg daily  Metoprolol tartrate 100 mg twice daily  Anticoagulation: Eliquis 5 mg twice daily  -Medications previously tried: NA -Counseled on avoidance of NSAIDs due to increased bleeding risk with anticoagulants; -Recommended to continue current medication  Hyperlipidemia: (LDL goal < 70) -Controlled -History of NSTEMI -Current treatment: Atorvastatin 40 mg daily  -Medications previously tried: NA  -Educated on Strategies to manage statin-induced myalgias; -Recommended to continue current medication  Hypothyroidism (Goal: Maintain stable thyroid function) -Controlled -Current treatment  Levothyroxine 75 mcg daily before breakfast  -Medications previously tried: NA  -Counseled on diet and exercise  extensively  Chronic Kidney Disease Stage 4  -All medications assessed for renal dosing and appropriateness in chronic kidney disease. -Recommended to continue current medication   Patient Goals/Self-Care Activities Patient will:  - check blood pressure 2-3 times weekly, document, and provide at future appointments  Follow Up Plan: Telephone follow up appointment with care management team member scheduled for:  12/07/2021 at 3:00 PM    Patient  agreed to services and verbal consent obtained.   Patient verbalizes understanding of instructions and care plan provided today and agrees to view in [place]. Active MyChart status confirmed with patient.    Junius Argyle, PharmD, Para March, CPP  Clinical Pharmacist Practitioner  [facility] [phone]

## 2021-06-18 NOTE — Telephone Encounter (Signed)
Forms printed and placed in nurse fax box in medical records to be faxed off to contact information provided on cover letter. Will make Prior Auth Nurse aware of completion.

## 2021-06-18 NOTE — Telephone Encounter (Signed)
**Note De-Identified Delontae Lamm Obfuscation** Letter received Ben Habermann fax from [employer] asking [place] to indicate on the providers page (they did attach the providers page of the pts application with the letter) of the pts application where the pts Eliquis should be shipped to.  I have checked the option to have it shipped to the pts address and have e-mailed it to our TTL for today so she can fax it back to [employer] at the fax number written on the cover letter included or to place in the to be faxed basket in Medical Records to be faxed.

## 2021-06-20 ENCOUNTER — Other Ambulatory Visit: Payer: Self-pay | Admitting: Cardiovascular Disease

## 2021-06-29 NOTE — Telephone Encounter (Signed)
**Note De-Identified  Obfuscation** Letter erceived from [facility] stating that they denied the pt for Eliquis asst. Reason: documentation of 3% out of pocket RX expenses, based on household adjusted gross income, not met.  The letter states that they have notified the pt of this denial as well.

## 2021-06-30 ENCOUNTER — Other Ambulatory Visit: Payer: Self-pay | Admitting: Family Medicine

## 2021-06-30 DIAGNOSIS — I10 Essential (primary) hypertension: Secondary | ICD-10-CM

## 2021-06-30 DIAGNOSIS — I480 Paroxysmal atrial fibrillation: Secondary | ICD-10-CM

## 2021-06-30 DIAGNOSIS — E039 Hypothyroidism, unspecified: Secondary | ICD-10-CM

## 2021-06-30 NOTE — Telephone Encounter (Signed)
Requested Prescriptions  Pending Prescriptions Disp Refills   levothyroxine (SYNTHROID) 75 MCG tablet [Pharmacy Med Name: LEVOTHYROXINE 75 MCG TABLET] 90 tablet 2    Sig: TAKE 1 TABLET BY MOUTH DAILY BEFORE BREAKFAST.     Endocrinology:  Hypothyroid Agents Failed - 06/30/2021  1:43 AM      Failed - TSH needs to be rechecked within 3 months after an abnormal result. Refill until TSH is due.      Passed - TSH in normal range and within 360 days    TSH  Date Value Ref Range Status  01/22/2021 2.680 0.450 - 4.500 uIU/mL Final         Passed - Valid encounter within last 12 months    Recent Outpatient Visits          3 months ago Dilated cardiomyopathy [facility])   [facility] [name]., MD   4 months ago Pneumonitis   [facility] [name]., MD   5 months ago Type 2 diabetes mellitus without complication, without long-term current use of insulin [facility])   [facility] [name]., MD   6 months ago Sprain of costal cartilage, initial encounter   [facility] [name]., MD   7 months ago COVID-19   [facility] Charlynne Cousins, MD      Future Appointments            In 3 weeks [name]., MD [facility] [place]

## 2021-07-03 ENCOUNTER — Ambulatory Visit (INDEPENDENT_AMBULATORY_CARE_PROVIDER_SITE_OTHER): Payer: Medicare Other | Admitting: Family Medicine

## 2021-07-03 ENCOUNTER — Other Ambulatory Visit: Payer: Self-pay

## 2021-07-03 ENCOUNTER — Encounter: Payer: Self-pay | Admitting: Family Medicine

## 2021-07-03 VITALS — BP 145/64 | HR 56 | Temp 98.5°F | Resp 16 | Wt 231.0 lb

## 2021-07-03 DIAGNOSIS — J069 Acute upper respiratory infection, unspecified: Secondary | ICD-10-CM | POA: Diagnosis not present

## 2021-07-03 DIAGNOSIS — R0602 Shortness of breath: Secondary | ICD-10-CM | POA: Diagnosis not present

## 2021-07-03 DIAGNOSIS — Z79899 Other long term (current) drug therapy: Secondary | ICD-10-CM

## 2021-07-03 MED ORDER — BENZONATATE 100 MG PO CAPS: 100.0000 mg | ORAL_CAPSULE | Freq: Two times a day (BID) | ORAL | 0 refills | Status: DC | PRN

## 2021-07-03 NOTE — Progress Notes (Signed)
Acute Office Visit  Subjective:    Patient ID: Ronald [name], male    DOB: 1946/01/07, 76 y.o.   MRN: [id number]  Chief Complaint  Patient presents with   Cough    Cough This is a new (for 3 weeks) problem. The current episode started 1 to 4 weeks ago. The problem has been gradually worsening. The problem occurs every few hours. The cough is Productive of sputum. Associated symptoms include shortness of breath. Pertinent negatives include no chest pain, ear congestion, ear pain, fever, nasal congestion, postnasal drip or wheezing. Nothing aggravates the symptoms. He has tried OTC cough suppressant for the symptoms. The treatment provided no relief.   Chronic postnasal drip. Worse at night.   Past Medical History:  Diagnosis Date   AAA (abdominal aortic aneurysm)    a. 3cm by [place] 2015.   Arthritis    "hips; back" (12/13/2014)   CAD (coronary artery disease) 2007   a. s/p CABG- IMA-LAD, VG-Cx, VG-RCA, VG-diag in 1999. B. sp redo CABG- VG-OM, VG-RCA in 2007 due to VG disease. c. NSTEMI 11/2014 s/p DES to SVG-OM from the Y graft.d. PTCA/DES x 1 distal body of SVG to Diagonal.09/2015   Chronic combined systolic and diastolic CHF (congestive heart failure) ([place])    a. remote EF 40-45% in 2006. b. Normal EF 2014. b. Echo 07/2016 EF 45-50%, grade 1 DD.   Chronic lower back pain    CKD (chronic kidney disease), stage IV (HCC)    COPD (chronic obstructive pulmonary disease) (HCC)    Deafness in left ear    Degenerative disc disease, lumbar    Dilated cardiomyopathy ([place]) 10/07/2015   Emphysema    Esophageal stricture 07/02/1998   EGD   GERD (gastroesophageal reflux disease)    History of gout    "last flareup was in 2007" (12/13/2014)   History of hiatal hernia    Hyperlipidemia    Hypertension    Ischemic cardiomyopathy 2006   EF 40% to 50% by 2D echo in 2006;  Echo 12/31/12: Mild LVH, EF 50-55%, normal wall motion.    PVC's (premature ventricular contractions)    Renal artery stenosis  ([place])    a. noted on CT 2008.   Type II diabetes mellitus (HCC)    Diet control    Walking pneumonia 1990's    Past Surgical History:  Procedure Laterality Date   CARDIAC CATHETERIZATION  "several"   CARDIAC CATHETERIZATION N/A 12/13/2014   Procedure: Left Heart Cath and Coronary Angiography;  Surgeon: Jettie Booze, MD;  Location: [place] [facility];  Service: Cardiovascular;  Laterality: N/A;   CARDIAC CATHETERIZATION  12/13/2014   Procedure: Coronary Stent Intervention;  Surgeon: Jettie Booze, MD;  Location: [place] [facility];  Service: Cardiovascular;;   CARDIAC CATHETERIZATION N/A 10/07/2015   Procedure: Left Heart Cath and Cors/Grafts Angiography;  Surgeon: Burnell Blanks, MD;  Location: [place] [facility];  Service: Cardiovascular;  Laterality: N/A;   CARDIAC CATHETERIZATION N/A 10/07/2015   Procedure: Coronary Stent Intervention;  Surgeon: Burnell Blanks, MD;  Location: [place] [facility];  Service: Cardiovascular;  Laterality: N/A;   CORONARY ANGIOPLASTY  "several"   CORONARY ANGIOPLASTY WITH STENT PLACEMENT  2005; 12/13/2014   "2; 1"   CORONARY ARTERY BYPASS GRAFT  1996   CABG X5   CORONARY ARTERY BYPASS GRAFT  March 2007   CABG X3   ESOPHAGOGASTRODUODENOSCOPY (EGD) WITH ESOPHAGEAL DILATION  2000   GREEN LIGHT LASER TURP (TRANSURETHRAL RESECTION  OF PROSTATE  2000's   "not cancerous"   HERNIA REPAIR     LAPAROSCOPIC CHOLECYSTECTOMY     LEFT HEART CATH AND CORS/GRAFTS ANGIOGRAPHY N/A 07/28/2017   Procedure: LEFT HEART CATH AND CORS/GRAFTS ANGIOGRAPHY;  Surgeon: Troy Sine, MD;  Location: [place] [facility];  Service: Cardiovascular;  Laterality: N/A;   LUNG SURGERY  1996   "S/P CABG, had to put staple in lung after it had collapsed"   UMBILICAL HERNIA REPAIR     w/chole    Family History  Problem Relation Age of Onset   Heart attack Mother        MI   Stroke Mother    Heart disease Mother    Hypertension Mother    Hyperlipidemia Mother     Heart disease Father    Rheumatic fever Father    Colon cancer Neg Hx     Social History   Socioeconomic History   Marital status: Married    Spouse name: Not on file   Number of children: 2   Years of education: Not on file   Highest education level: 8th grade  Occupational History   Occupation: Retired  Tobacco Use   Smoking status: Former    Packs/day: 3.00    Years: 20.00    Pack years: 60.00    Types: Cigarettes    Quit date: 07/25/1986    Years since quitting: 34.9   Smokeless tobacco: Never  Vaping Use   Vaping Use: Never used  Substance and Sexual Activity   Alcohol use: No    Alcohol/week: 0.0 standard drinks   Drug use: No   Sexual activity: Not Currently  Other Topics Concern   Not on file  Social History Narrative   Did auto salvage work.   Lives at home with his wife.  Independent at baseline.   Social Determinants of Health   Financial Resource Strain: Low Risk    Difficulty of Paying Living Expenses: Not hard at all  Food Insecurity: Not on file  Transportation Needs: Not on file  Physical Activity: Not on file  Stress: Not on file  Social Connections: Not on file  Intimate Partner Violence: Not on file    Outpatient Medications Prior to Visit  Medication Sig Dispense Refill   ACETAMINOPHEN PO Take 650 mg by mouth. 2 at bedtime     allopurinol (ZYLOPRIM) 100 MG tablet Take 1 tablet (100 mg total) by mouth at bedtime. 90 tablet 3   amiodarone (PACERONE) 200 MG tablet TAKE 1 TABLET BY MOUTH  DAILY 90 tablet 2   amLODipine (NORVASC) 10 MG tablet TAKE 1 TABLET BY MOUTH EVERY DAY 90 tablet 1   apixaban (ELIQUIS) 5 MG TABS tablet Take 1 tablet (5 mg total) by mouth 2 (two) times daily. 180 tablet 3   atorvastatin (LIPITOR) 40 MG tablet TAKE 1 TABLET BY MOUTH EVERY DAY 90 tablet 2   Cholecalciferol (VITAMIN D-3) 125 MCG (5000 UT) TABS Take 2,000 Units by mouth daily.      enalapril (VASOTEC) 20 MG tablet Take 20 mg by mouth daily.     furosemide  (LASIX) 40 MG tablet Take 40 mg by mouth daily as needed for fluid or edema.   6   isosorbide mononitrate (IMDUR) 30 MG 24 hr tablet TAKE 1 TABLET BY MOUTH  DAILY 90 tablet 3   levothyroxine (SYNTHROID) 75 MCG tablet TAKE 1 TABLET BY MOUTH DAILY BEFORE BREAKFAST. 90 tablet 2   loratadine (CLARITIN) 10 MG  tablet Take 10 mg by mouth daily as needed for allergies.     magnesium oxide (MAG-OX) 400 (240 Mg) MG tablet TAKE 1 TABLET BY MOUTH TWICE A DAY 180 tablet 0   Melatonin 5 MG TABS Take 1 tablet by mouth at bedtime.     metoprolol tartrate (LOPRESSOR) 100 MG tablet TAKE 1 TABLET BY MOUTH  TWICE DAILY 180 tablet 2   Multiple Vitamin (MULTIVITAMIN PO) Take 1 tablet by mouth daily.     nitroGLYCERIN (NITROSTAT) 0.4 MG SL tablet Place 0.4 mg under the tongue every 5 (five) minutes as needed for chest pain (Up to 3 times).      pantoprazole (PROTONIX) 40 MG tablet TAKE 2 TABLETS BY MOUTH  DAILY 180 tablet 1   polyethylene glycol (MIRALAX / GLYCOLAX) 17 g packet Take 17 g by mouth daily.     sodium bicarbonate 650 MG tablet Take 650 mg by mouth 2 (two) times daily. Evening     No facility-administered medications prior to visit.    Allergies  Allergen Reactions   Predicort [Prednisolone] Other (See Comments)    Stomach pain   Ciprofloxacin     GI upset   Hydrochlorothiazide Other (See Comments)    Dehydration   Hydrocodone     Stomach upset   Hydrocodone-Acetaminophen     Stomach upset   Hydrocodone-Acetaminophen Nausea Only   Loratadine Other (See Comments)    Other reaction(s): Unknown   Sulfa Antibiotics Other (See Comments)    Cannot recall   Sulfacetamide Sodium Other (See Comments)    Cannot recall   Sulfasalazine     Other reaction(s): Other (See Comments) Cannot recall   Hydrocodone-Acetaminophen Nausea Only   Penicillins Hives and Rash    Has patient had a PCN reaction causing immediate rash, facial/tongue/throat swelling, SOB or lightheadedness with hypotension: YES Has  patient had a PCN reaction causing severe rash involving mucus membranes or skin necrosis: NO Has patient had a PCN reaction that required hospitalization NO Has patient had a PCN reaction occurring within the last 10 years:NO If all of the above answers are "NO", then may proceed with Cephalosporin use.    Review of Systems  Constitutional:  Negative for fever.  HENT:  Negative for ear pain and postnasal drip.   Respiratory:  Positive for cough and shortness of breath. Negative for wheezing.   Cardiovascular:  Negative for chest pain.      Objective:    Physical Exam Vitals reviewed.  Constitutional:      General: He is not in acute distress.    Appearance: Normal appearance. He is not diaphoretic.  HENT:     Head: Normocephalic and atraumatic.  Eyes:     General: No scleral icterus.    Conjunctiva/sclera: Conjunctivae normal.  Cardiovascular:     Rate and Rhythm: Normal rate and regular rhythm.     Pulses: Normal pulses.     Heart sounds: Normal heart sounds. No murmur heard. Pulmonary:     Effort: Pulmonary effort is normal. No respiratory distress.     Breath sounds: Normal breath sounds. No wheezing or rhonchi.  Abdominal:     General: There is no distension.     Palpations: Abdomen is soft.     Tenderness: There is no abdominal tenderness.  Musculoskeletal:     Cervical back: Neck supple.     Right lower leg: No edema.     Left lower leg: No edema.  Lymphadenopathy:     Cervical: No  cervical adenopathy.  Skin:    General: Skin is warm and dry.     Capillary Refill: Capillary refill takes less than 2 seconds.     Findings: No rash.  Neurological:     Mental Status: He is alert and oriented to person, place, and time.  Psychiatric:        Mood and Affect: Mood normal.        Behavior: Behavior normal.    BP (!) 145/64 (BP Location: Right Arm, Patient Position: Sitting, Cuff Size: Large)    Pulse (!) 56    Temp 98.5 F (36.9 C) (Oral)    Resp 16    Wt 231 lb  (104.8 kg)    SpO2 98%    BMI 33.15 kg/m  Wt Readings from Last 3 Encounters:  07/03/21 231 lb (104.8 kg)  03/25/21 235 lb (106.6 kg)  02/25/21 238 lb (108 kg)    Health Maintenance Due  Topic Date Due   Zoster Vaccines- Shingrix (1 of 2) Never done   COVID-19 Vaccine (3 - Moderna risk series) 09/22/2019   COLONOSCOPY (Pts 45-52yr Insurance coverage will need to be confirmed)  11/19/2019    There are no preventive care reminders to display for this patient.   Lab Results  Component Value Date   TSH 2.680 01/22/2021   Lab Results  Component Value Date   WBC 5.7 01/22/2021   HGB 11.7 (L) 01/22/2021   HCT 35.9 (L) 01/22/2021   MCV 90 01/22/2021   PLT 161 01/22/2021   Lab Results  Component Value Date   NA 142 01/22/2021   K 5.3 (H) 01/22/2021   CO2 21 01/22/2021   GLUCOSE 121 (H) 01/22/2021   BUN 41 (H) 01/22/2021   CREATININE 3.21 (H) 01/22/2021   BILITOT 0.9 01/22/2021   ALKPHOS 155 (H) 01/22/2021   AST 20 01/22/2021   ALT 17 01/22/2021   PROT 6.3 01/22/2021   ALBUMIN 4.3 01/22/2021   CALCIUM 9.4 01/22/2021   ANIONGAP 8 06/14/2018   EGFR 19 (L) 01/22/2021   Lab Results  Component Value Date   CHOL 147 03/20/2020   Lab Results  Component Value Date   HDL 52 03/20/2020   Lab Results  Component Value Date   LDLCALC 80 03/20/2020   Lab Results  Component Value Date   TRIG 80 03/20/2020   Lab Results  Component Value Date   CHOLHDL 2.8 03/20/2020   Lab Results  Component Value Date   HGBA1C 6.0 (H) 01/22/2021       Assessment & Plan:   1. Viral URI with cough - symptoms and exam c/w viral URI - no evidence of strep pharyngitis, CAP, AOM, bacterial sinusitis, or other bacterial infection - discussed symptomatic management, natural course, and return precautions   2. Long-term use of high-risk medication 3. SOB (shortness of breath) - chronic and stable with no acute change - is on long term amiodarone with no previous PFTs - will send to  pulm - Ambulatory referral to Pulmonology    Meds ordered this encounter  Medications   benzonatate (TESSALON) 100 MG capsule    Sig: Take 1 capsule (100 mg total) by mouth 2 (two) times daily as needed for cough.    Dispense:  20 capsule    Refill:  0     Tashon Capp, ADionne Bucy MD, MPH [facility]

## 2021-07-06 ENCOUNTER — Telehealth: Payer: Self-pay

## 2021-07-06 NOTE — Telephone Encounter (Signed)
Patient was seen by Dr B on Friday       Copied from [place] [phone]. Topic: General - Other >> Jul 06, 2021 11:35 AM Tessa Lerner A wrote: Reason for CRM: The patient's wife has called to share that the patient has taken their mucinex and benzonatate (TESSALON) 100 MG capsule [[id number]]    The patient's cough has not improved, the patient's wife would like the patient to be prescribed a different medication to help with their discomfort  The patient has began to experience soreness from the continued coughing as well  Please contact further when available

## 2021-07-12 ENCOUNTER — Other Ambulatory Visit: Payer: Self-pay | Admitting: Family Medicine

## 2021-07-13 NOTE — Telephone Encounter (Signed)
Requested medications are due for refill today.  yes  Requested medications are on the active medications list.  yes  Last refill. 07/23/2020 #90 3 refills  Future visit scheduled.   yes  Notes to clinic.  Failed protocol d/t missing and expired labs.    Requested Prescriptions  Pending Prescriptions Disp Refills   allopurinol (ZYLOPRIM) 100 MG tablet [Pharmacy Med Name: ALLOPURINOL 100 MG TABLET] 90 tablet 3    Sig: TAKE 1 TABLET BY MOUTH EVERYDAY AT BEDTIME     Endocrinology:  Gout Agents - allopurinol Failed - 07/12/2021 12:52 AM      Failed - Uric Acid in normal range and within 360 days    No results found for: POCURA, LABURIC        Failed - Cr in normal range and within 360 days    Creat  Date Value Ref Range Status  11/24/2015 2.44 (H) 0.70 - 1.18 mg/dL Final    Comment:      For patients > or = 78 years of age: The upper reference limit for Creatinine is approximately 13% higher for people identified as African-American.      Creatinine, Ser  Date Value Ref Range Status  01/22/2021 3.21 (H) 0.76 - 1.27 mg/dL Final          Failed - CBC within normal limits and completed in the last 12 months    WBC  Date Value Ref Range Status  01/22/2021 5.7 3.4 - 10.8 x10E3/uL Final  06/14/2018 7.4 4.0 - 10.5 K/uL Final   RBC  Date Value Ref Range Status  01/22/2021 4.00 (L) 4.14 - 5.80 x10E6/uL Final  06/14/2018 4.43 4.22 - 5.81 MIL/uL Final   Hemoglobin  Date Value Ref Range Status  01/22/2021 11.7 (L) 13.0 - 17.7 g/dL Final   Hematocrit  Date Value Ref Range Status  01/22/2021 35.9 (L) 37.5 - 51.0 % Final   MCHC  Date Value Ref Range Status  01/22/2021 32.6 31.5 - 35.7 g/dL Final  06/14/2018 32.7 30.0 - 36.0 g/dL Final   [facility]  Date Value Ref Range Status  01/22/2021 29.3 26.6 - 33.0 pg Final  06/14/2018 28.0 26.0 - 34.0 pg Final   MCV  Date Value Ref Range Status  01/22/2021 90 79 - 97 fL Final   No results found for: PLTCOUNTKUC, LABPLAT,  POCPLA RDW  Date Value Ref Range Status  01/22/2021 14.4 11.6 - 15.4 % Final         Passed - Valid encounter within last 12 months    Recent Outpatient Visits           1 week ago Viral URI with cough   [facility] [place], Dionne Bucy, MD   3 months ago Dilated cardiomyopathy [facility])   [facility] [name]., MD   4 months ago Pneumonitis   [facility] [name]., MD   5 months ago Type 2 diabetes mellitus without complication, without long-term current use of insulin [facility])   [facility] [name]., MD   7 months ago Sprain of costal cartilage, initial encounter   [facility] [name]., MD       Future Appointments             In 2 weeks [name]., MD [facility], [place]

## 2021-07-22 ENCOUNTER — Other Ambulatory Visit: Payer: Self-pay | Admitting: Family Medicine

## 2021-07-22 DIAGNOSIS — K219 Gastro-esophageal reflux disease without esophagitis: Secondary | ICD-10-CM

## 2021-07-22 NOTE — Telephone Encounter (Signed)
Requested medication (s) are due for refill today:   No  Requested medication (s) are on the active medication list:   Yes  Future visit scheduled:   Yes in 5 days   Last ordered: 03/28/2021 #180, 1 refill  Returned because a 1 yr. Supply is being requested   Requested Prescriptions  Pending Prescriptions Disp Refills   pantoprazole (PROTONIX) 40 MG tablet [Pharmacy Med Name: PANTOPRAZOLE SOD DR 40 MG TAB] 60 tablet     Sig: TAKE 2 TABLETS BY MOUTH EVERY DAY     Gastroenterology: Proton Pump Inhibitors Passed - 07/22/2021  9:32 AM      Passed - Valid encounter within last 12 months    Recent Outpatient Visits           2 weeks ago Viral URI with cough   [facility], Dionne Bucy, MD   3 months ago Dilated cardiomyopathy [facility])   [facility] [name]., MD   4 months ago Pneumonitis   [facility] [name]., MD   6 months ago Type 2 diabetes mellitus without complication, without long-term current use of insulin [facility])   [facility] [name]., MD   7 months ago Sprain of costal cartilage, initial encounter   [facility] [name]., MD       Future Appointments             In 5 days [name]., MD [facility], [place]

## 2021-07-27 ENCOUNTER — Encounter: Payer: Self-pay | Admitting: Family Medicine

## 2021-07-27 ENCOUNTER — Other Ambulatory Visit: Payer: Self-pay

## 2021-07-27 ENCOUNTER — Ambulatory Visit (INDEPENDENT_AMBULATORY_CARE_PROVIDER_SITE_OTHER): Payer: Medicare Other | Admitting: Family Medicine

## 2021-07-27 VITALS — BP 156/62 | HR 53 | Temp 97.8°F | Wt 232.0 lb

## 2021-07-27 DIAGNOSIS — M1610 Unilateral primary osteoarthritis, unspecified hip: Secondary | ICD-10-CM | POA: Diagnosis not present

## 2021-07-27 DIAGNOSIS — E1121 Type 2 diabetes mellitus with diabetic nephropathy: Secondary | ICD-10-CM

## 2021-07-27 DIAGNOSIS — E7800 Pure hypercholesterolemia, unspecified: Secondary | ICD-10-CM | POA: Diagnosis not present

## 2021-07-27 DIAGNOSIS — G4701 Insomnia due to medical condition: Secondary | ICD-10-CM

## 2021-07-27 DIAGNOSIS — I420 Dilated cardiomyopathy: Secondary | ICD-10-CM

## 2021-07-27 DIAGNOSIS — I480 Paroxysmal atrial fibrillation: Secondary | ICD-10-CM

## 2021-07-27 LAB — POCT GLYCOSYLATED HEMOGLOBIN (HGB A1C): Hemoglobin A1C: 5.7 % — AB (ref 4.0–5.6)

## 2021-07-27 MED ORDER — TRAZODONE HCL 50 MG PO TABS: 25.0000 mg | ORAL_TABLET | Freq: Every evening | ORAL | 6 refills | Status: DC | PRN

## 2021-07-27 NOTE — Progress Notes (Signed)
Argentina Ponder [place],acting as a scribe for [name], MD.,have documented all relevant documentation on the behalf of [name], MD,as directed by  [name], MD while in the presence of [name], MD.     Established patient visit   Patient: Ronald [name] [facility]   DOB: March 03, 1946   76 y.o. Male  MRN: [id number] Visit Date: 07/27/2021  Today's healthcare provider: [name], MD   No chief complaint on file.  Subjective    HPI  Patient comes in today for follow-up of chronic medical problems.  He is not doing well but all the problems are stable.  He continues to have back pain fatigue and shortness of breath.  He is not far away from needing dialysis.  Medications are unchanged.  He is not taking any nonsteroidals Diabetes Mellitus Type II, Follow-up  Lab Results  Component Value Date   HGBA1C 6.0 (H) 01/22/2021   HGBA1C 5.8 (A) 11/03/2020   HGBA1C 5.8 (A) 06/30/2020   Wt Readings from Last 3 Encounters:  07/27/21 232 lb (105.2 kg)  07/03/21 231 lb (104.8 kg)  03/25/21 235 lb (106.6 kg)   Last seen for diabetes 4 months ago.  Management since then includes none. He reports good compliance with treatment. He is not having side effects.  Symptoms: Yes fatigue No foot ulcerations  No appetite changes No nausea  No paresthesia of the feet  No polydipsia  No polyuria Yes visual disturbances   No vomiting     Home blood sugar records:  not being checked  Episodes of hypoglycemia? No    Current insulin regiment: none   Pertinent Labs: Lab Results  Component Value Date   CHOL 147 03/20/2020   HDL 52 03/20/2020   LDLCALC 80 03/20/2020   TRIG 80 03/20/2020   CHOLHDL 2.8 03/20/2020   Lab Results  Component Value Date   NA 142 01/22/2021   K 5.3 (H) 01/22/2021   CREATININE 3.21 (H) 01/22/2021   EGFR 19 (L) 01/22/2021   MICROALBUR 50 04/30/2019      ---------------------------------------------------------------------------------------------------   Medications: Outpatient Medications Prior to Visit  Medication Sig   ACETAMINOPHEN PO Take 650 mg by mouth. 2 at bedtime   allopurinol (ZYLOPRIM) 100 MG tablet TAKE 1 TABLET BY MOUTH EVERYDAY AT BEDTIME   amiodarone (PACERONE) 200 MG tablet TAKE 1 TABLET BY MOUTH  DAILY   amLODipine (NORVASC) 10 MG tablet TAKE 1 TABLET BY MOUTH EVERY DAY   apixaban (ELIQUIS) 5 MG TABS tablet Take 1 tablet (5 mg total) by mouth 2 (two) times daily.   atorvastatin (LIPITOR) 40 MG tablet TAKE 1 TABLET BY MOUTH EVERY DAY   benzonatate (TESSALON) 100 MG capsule Take 1 capsule (100 mg total) by mouth 2 (two) times daily as needed for cough.   Cholecalciferol (VITAMIN D-3) 125 MCG (5000 UT) TABS Take 2,000 Units by mouth daily.    enalapril (VASOTEC) 20 MG tablet Take 20 mg by mouth daily.   furosemide (LASIX) 40 MG tablet Take 40 mg by mouth daily as needed for fluid or edema.    isosorbide mononitrate (IMDUR) 30 MG 24 hr tablet TAKE 1 TABLET BY MOUTH  DAILY   levothyroxine (SYNTHROID) 75 MCG tablet TAKE 1 TABLET BY MOUTH DAILY BEFORE BREAKFAST.   loratadine (CLARITIN) 10 MG tablet Take 10 mg by mouth daily as needed for allergies.   magnesium oxide (MAG-OX) 400 (240 Mg) MG tablet TAKE 1 TABLET BY MOUTH TWICE A DAY  Melatonin 5 MG TABS Take 1 tablet by mouth at bedtime.   metoprolol tartrate (LOPRESSOR) 100 MG tablet TAKE 1 TABLET BY MOUTH  TWICE DAILY   Multiple Vitamin (MULTIVITAMIN PO) Take 1 tablet by mouth daily.   nitroGLYCERIN (NITROSTAT) 0.4 MG SL tablet Place 0.4 mg under the tongue every 5 (five) minutes as needed for chest pain (Up to 3 times).    pantoprazole (PROTONIX) 40 MG tablet TAKE 2 TABLETS BY MOUTH EVERY DAY   polyethylene glycol (MIRALAX / GLYCOLAX) 17 g packet Take 17 g by mouth daily.   sodium bicarbonate 650 MG tablet Take 650 mg by mouth 2 (two) times daily. Evening   No  facility-administered medications prior to visit.    Review of Systems      Objective    BP (!) 156/62 (BP Location: Right Arm, Patient Position: Sitting, Cuff Size: Large)    Pulse (!) 53    Temp 97.8 F (36.6 C) (Oral)    Wt 232 lb (105.2 kg)    SpO2 99%    BMI 33.29 kg/m     Physical Exam Vitals reviewed.  Constitutional:      Appearance: Normal appearance.  HENT:     Head: Normocephalic and atraumatic.     Right Ear: External ear normal.     Left Ear: External ear normal.  Eyes:     General: No scleral icterus.    Conjunctiva/sclera: Conjunctivae normal.  Cardiovascular:     Rate and Rhythm: Normal rate and regular rhythm.     Pulses: Normal pulses.     Heart sounds: Normal heart sounds.  Pulmonary:     Effort: Pulmonary effort is normal.     Breath sounds: Normal breath sounds.  Abdominal:     General: Abdomen is protuberant.     Palpations: Abdomen is soft.     Tenderness: There is no abdominal tenderness.     Comments: No CVAT.  Abdomen is protuberant.  This is a normal finding in.  He does have some spots of bruising on his right right abdominal wall and mid abdomen.    Musculoskeletal:     Right lower leg: No edema.     Left lower leg: No edema.  Skin:    General: Skin is warm and dry.  Neurological:     General: No focal deficit present.     Mental Status: He is alert and oriented to person, place, and time.  Psychiatric:        Mood and Affect: Mood normal.        Behavior: Behavior normal.        Thought Content: Thought content normal.        Judgment: Judgment normal.      No results found for any visits on 07/27/21.  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  GFR in the teens. - POCT glycosylated hemoglobin (Hb A1C) Maximal medical therapy 2. Insomnia due to medical condition Trazodone 50 mg daily, start with 25  3. Dilated cardiomyopathy ([place]) Followed  by cardiology. All risk factors treated. 4. Paroxysmal atrial fibrillation (HCC) On Eliquis 5 mg twice daily.  Consider decreasing dosage  5. Primary osteoarthritis of hip, unspecified laterality   6. Pure hypercholesterolemia On atorvastatin 40   No follow-ups on file.      I, [name], MD, have reviewed all documentation for this visit. The documentation on 07/29/21 for the  exam, diagnosis, procedures, and orders are all accurate and complete.    Margaretta Chittum Cranford Mon, MD  [facility] [phone] (phone) [phone] (fax)  [place]

## 2021-07-31 ENCOUNTER — Telehealth: Payer: Self-pay

## 2021-07-31 NOTE — Progress Notes (Signed)
? ? ?Chronic Care Management ?Pharmacy Assistant  ? ?Name: Brittany Amirault [facility]  MRN: [id number] DOB: [birth year] ? ?Reason for Encounter: Hypertension Disease State Call ? ?Recent office visits:  ?07/27/2021 Miguel Aschoff, MD (PCP Office Visit) for DM- Started: Trazodone HCl 25-50 mg at bedtime prn, lab orders placed, No follow-up noted ? ?07/03/2021 Lavon Paganini, MD (PCP Office Visit) for Viral URI w/Cough- Started: Benzonatate 100 mg 2 times daily, Referral to Pulmonary ordered, No follow-up noted ? ?Recent consult visits:  ?None ID ? ?Hospital visits:  ?None in previous 6 months ? ?Medications: ?Outpatient Encounter Medications as of 07/31/2021  ?Medication Sig  ? ACETAMINOPHEN PO Take 650 mg by mouth. 2 at bedtime  ? allopurinol (ZYLOPRIM) 100 MG tablet TAKE 1 TABLET BY MOUTH EVERYDAY AT BEDTIME  ? amiodarone (PACERONE) 200 MG tablet TAKE 1 TABLET BY MOUTH  DAILY  ? amLODipine (NORVASC) 10 MG tablet TAKE 1 TABLET BY MOUTH EVERY DAY  ? apixaban (ELIQUIS) 5 MG TABS tablet Take 1 tablet (5 mg total) by mouth 2 (two) times daily.  ? atorvastatin (LIPITOR) 40 MG tablet TAKE 1 TABLET BY MOUTH EVERY DAY  ? benzonatate (TESSALON) 100 MG capsule Take 1 capsule (100 mg total) by mouth 2 (two) times daily as needed for cough.  ? Cholecalciferol (VITAMIN D-3) 125 MCG (5000 UT) TABS Take 2,000 Units by mouth daily.   ? enalapril (VASOTEC) 20 MG tablet Take 20 mg by mouth daily.  ? furosemide (LASIX) 40 MG tablet Take 40 mg by mouth daily as needed for fluid or edema.   ? isosorbide mononitrate (IMDUR) 30 MG 24 hr tablet TAKE 1 TABLET BY MOUTH  DAILY  ? levothyroxine (SYNTHROID) 75 MCG tablet TAKE 1 TABLET BY MOUTH DAILY BEFORE BREAKFAST.  ? loratadine (CLARITIN) 10 MG tablet Take 10 mg by mouth daily as needed for allergies.  ? magnesium oxide (MAG-OX) 400 (240 Mg) MG tablet TAKE 1 TABLET BY MOUTH TWICE A DAY  ? Melatonin 5 MG TABS Take 1 tablet by mouth at bedtime.  ? metoprolol tartrate (LOPRESSOR) 100 MG tablet TAKE 1  TABLET BY MOUTH  TWICE DAILY  ? Multiple Vitamin (MULTIVITAMIN PO) Take 1 tablet by mouth daily.  ? nitroGLYCERIN (NITROSTAT) 0.4 MG SL tablet Place 0.4 mg under the tongue every 5 (five) minutes as needed for chest pain (Up to 3 times).   ? pantoprazole (PROTONIX) 40 MG tablet TAKE 2 TABLETS BY MOUTH EVERY DAY  ? polyethylene glycol (MIRALAX / GLYCOLAX) 17 g packet Take 17 g by mouth daily.  ? sodium bicarbonate 650 MG tablet Take 650 mg by mouth 2 (two) times daily. Evening  ? traZODone (DESYREL) 50 MG tablet Take 0.5-1 tablets (25-50 mg total) by mouth at bedtime as needed for sleep.  ? ?No facility-administered encounter medications on file as of 07/31/2021.  ? ?Care Gaps: ?BP > 140/90 ?Zoster Vaccine ?COVID-19 Vaccine Booster 3 ? ?Star Rating Drugs: ?Atorvastatin 40 mg last filled on 05/27/2021 for a 90-Day supply with [facility] ?Enalapril 20 mg  last filled on 05/05/2021 for a 90-Day supply with [facility] ? ?Reviewed chart prior to disease state call. Spoke with patient regarding BP ? ?Recent Office Vitals: ?BP Readings from Last 3 Encounters:  ?07/27/21 (!) 156/62  ?07/03/21 (!) 145/64  ?03/25/21 (!) 155/60  ? ?Pulse Readings from Last 3 Encounters:  ?07/27/21 (!) 53  ?07/03/21 (!) 56  ?03/25/21 (!) 54  ?  ?Wt Readings from Last 3 Encounters:  ?07/27/21 232 lb (105.2  kg)  ?07/03/21 231 lb (104.8 kg)  ?03/25/21 235 lb (106.6 kg)  ?  ? ?Kidney Function ?Lab Results  ?Component Value Date/Time  ? CREATININE 3.21 (H) 01/22/2021 04:18 PM  ? CREATININE 2.46 (H) 03/20/2020 10:38 AM  ? CREATININE 2.44 (H) 11/24/2015 03:46 PM  ? CREATININE 2.17 (H) 10/20/2015 02:18 PM  ? GFRNONAA 25 (L) 03/20/2020 10:38 AM  ? GFRAA 28 (L) 03/20/2020 10:38 AM  ? ? ?BMP Latest Ref Rng & Units 01/22/2021 03/20/2020 10/10/2019  ?Glucose 65 - 99 mg/dL 121(H) 124(H) 122(H)  ?BUN 8 - 27 mg/dL 41(H) 29(H) 28(H)  ?Creatinine 0.76 - 1.27 mg/dL 3.21(H) 2.46(H) 2.67(H)  ?BUN/Creat Ratio 10 - 24 13 12 10   ?Sodium 134 - 144 mmol/L 142 145(H) 144   ?Potassium 3.5 - 5.2 mmol/L 5.3(H) 4.8 5.3(H)  ?Chloride 96 - 106 mmol/L 105 110(H) 109(H)  ?CO2 20 - 29 mmol/L 21 19(L) 22  ?Calcium 8.6 - 10.2 mg/dL 9.4 9.1 9.5  ? ? ?Current antihypertensive regimen:  ?Amlodipine 10 mg daily  ?Enalapril 20 mg daily  ?Furosemide 40 mg daily as needed  ?Imdur 30 mg daily  ?Metoprolol 100 mg twice daily  ? ?How often are you checking your Blood Pressure?  Per the patient's spouse who is on HIPAA patient is not checking his blood pressure at home. I did remind her that CPP would like him to start checking it 2-3 times a week. ? ?Current home BP readings: No current home reading but when he saw PCP a few days ago his BP was elevated 156/62 ? ?What recent interventions/DTPs have been made by any provider to improve Blood Pressure control since last CPP Visit: None ID ? ?Any recent hospitalizations or ED visits since last visit with CPP? No ? ?What diet changes have been made to improve Blood Pressure Control?  ?There are no certain diet changes. Per spouse tries to watch salt intake. ? ?What exercise is being done to improve your Blood Pressure Control?  ?Patient is currently having issues with his breathing and no exercise routine established ? ?Adherence Review: ?Is the patient currently on ACE/ARB medication? Yes ?Does the patient have >5 day gap between last estimated fill dates? No ? ?I spoke to the patient's spouse, and she reports the patient is feeling better. She reports that he no longer has a cough, and his breathing is about the same. Per patient's spouse the patient has an appointment with pulmonary next week. ? ?Patient's spouse also reports that she has not started him on the Trazodone at this time, but she plans to start him on it Sunday. She did ask once she starts does she stop giving him benadryl and Melatonin.  I did advise her to only give him the Trazodone, and per the providers instruction for her to start him on 25 mg. I advised her after a few days if the 25  mg is not causing him any issues she can increase to 50 mg. She verbalized understanding, and confirmed she was instructed that at the providers office as well. I did encourage her to give me a call or providers office a call if she notices any side effects. ? ?Patient's spouse is requesting a refill on his Allopurinol 100  mg tablet. I informed her that PCP had sent this medication [facility] to [facility]/2021, and that [facility]. I encouraged the patient's spouse to give me  a call if [facility] not [facility]. Patient's spouse has no other concern's or issues at this time.  ? ?Patient has a telephone appointment scheduled with Junius Argyle, CPP on 12/07/2021 @ 1500 ? ?Lynann Bologna, CPA/CMA ?Clinical Pharmacist Assistant ?Phone: [phone]  ? ? ?

## 2021-08-01 ENCOUNTER — Other Ambulatory Visit: Payer: Self-pay | Admitting: Family Medicine

## 2021-08-03 DIAGNOSIS — H353221 Exudative age-related macular degeneration, left eye, with active choroidal neovascularization: Secondary | ICD-10-CM | POA: Diagnosis not present

## 2021-08-03 DIAGNOSIS — H353132 Nonexudative age-related macular degeneration, bilateral, intermediate dry stage: Secondary | ICD-10-CM | POA: Diagnosis not present

## 2021-08-03 NOTE — Telephone Encounter (Signed)
Requested medication (s) are due for refill today:   Yes ? ?Requested medication (s) are on the active medication list:   Yes ? ?Future visit scheduled:   Yes ? ? ?Last ordered: 05/05/2021 #180, 0 refills ? ?Returned because protocol criteria not met.   Lab work due.  ? ?Requested Prescriptions  ?Pending Prescriptions Disp Refills  ? magnesium oxide (MAG-OX) 400 (240 Mg) MG tablet [Pharmacy Med Name: MAGNESIUM OXIDE 400 MG TABLET] 180 tablet 0  ?  Sig: TAKE 1 TABLET BY MOUTH TWICE A DAY  ?  ? Endocrinology:  Minerals - Magnesium Supplementation Failed - 08/01/2021  9:02 AM  ?  ?  Failed - Mg Level in normal range and within 360 days  ?  Magnesium  ?Date Value Ref Range Status  ?06/12/2018 2.6 (H) 1.7 - 2.4 mg/dL Final  ?  Comment:  ?  Performed at [facility], [address], [place],  [address]  ?  ?  ?  ?  Failed - Cr in normal range and within 360 days  ?  Creat  ?Date Value Ref Range Status  ?11/24/2015 2.44 (H) 0.70 - 1.18 mg/dL Final  ?  Comment:  ?    ?For patients > or = 78 years of age: The upper reference limit for ?Creatinine is approximately 13% higher for people identified as ?African-American. ?  ?  ? ?Creatinine, Ser  ?Date Value Ref Range Status  ?01/22/2021 3.21 (H) 0.76 - 1.27 mg/dL Final  ?  ?  ?  ?  Passed - Valid encounter within last 12 months  ?  Recent Outpatient Visits   ? ?      ? 1 week ago Diabetes mellitus with nephropathy ([place])  ? [facility] [name]., MD  ? 1 month ago Viral URI with cough  ? [facility], Dionne Bucy, MD  ? 4 months ago Dilated cardiomyopathy [facility])  ? [facility] [name]., MD  ? 5 months ago Pneumonitis  ? [facility] [name]., MD  ? 6 months ago Type 2 diabetes mellitus without complication, without long-term current use of insulin ([place])  ? [facility] [name]., MD  ? ?  ?  ?Future Appointments   ? ?        ? In 3  months [name]., MD [facility]  ? ?  ? ?  ?  ?  ? ?

## 2021-08-07 ENCOUNTER — Ambulatory Visit: Payer: Medicare Other | Admitting: Internal Medicine

## 2021-08-07 ENCOUNTER — Other Ambulatory Visit: Payer: Self-pay

## 2021-08-07 ENCOUNTER — Encounter: Payer: Self-pay | Admitting: Internal Medicine

## 2021-08-07 ENCOUNTER — Ambulatory Visit
Admission: RE | Admit: 2021-08-07 | Discharge: 2021-08-07 | Disposition: A | Discharge status: Home / Self Care | Payer: Medicare Other | Source: Ambulatory Visit | Attending: Internal Medicine | Admitting: Internal Medicine

## 2021-08-07 ENCOUNTER — Other Ambulatory Visit
Admission: RE | Admit: 2021-08-07 | Discharge: 2021-08-07 | Disposition: A | Discharge status: Home / Self Care | Payer: Medicare Other | Source: Home / Self Care | Attending: Internal Medicine | Admitting: Internal Medicine

## 2021-08-07 DIAGNOSIS — R0609 Other forms of dyspnea: Secondary | ICD-10-CM

## 2021-08-07 DIAGNOSIS — I119 Hypertensive heart disease without heart failure: Secondary | ICD-10-CM | POA: Diagnosis not present

## 2021-08-07 DIAGNOSIS — R059 Cough, unspecified: Secondary | ICD-10-CM | POA: Diagnosis not present

## 2021-08-07 LAB — BASIC METABOLIC PANEL
Anion gap: 9 (ref 5–15)
BUN: 45 mg/dL — ABNORMAL HIGH (ref 8–23)
CO2: 25 mmol/L (ref 22–32)
Calcium: 9.3 mg/dL (ref 8.9–10.3)
Chloride: 109 mmol/L (ref 98–111)
Creatinine, Ser: 3.47 mg/dL — ABNORMAL HIGH (ref 0.61–1.24)
GFR, Estimated: 17 mL/min — ABNORMAL LOW (ref >60)
Glucose, Bld: 100 mg/dL — ABNORMAL HIGH (ref 70–99)
Potassium: 5.2 mmol/L — ABNORMAL HIGH (ref 3.5–5.1)
Sodium: 143 mmol/L (ref 135–145)

## 2021-08-07 LAB — CBC WITH DIFFERENTIAL/PLATELET
Abs Immature Granulocytes: 0.03 10*3/uL (ref 0.00–0.07)
Basophils Absolute: 0.1 10*3/uL (ref 0.0–0.1)
Basophils Relative: 1 %
Eosinophils Absolute: 0.4 10*3/uL (ref 0.0–0.5)
Eosinophils Relative: 6 %
HCT: 38 % — ABNORMAL LOW (ref 39.0–52.0)
Hemoglobin: 12 g/dL — ABNORMAL LOW (ref 13.0–17.0)
Immature Granulocytes: 0 %
Lymphocytes Relative: 18 %
Lymphs Abs: 1.3 10*3/uL (ref 0.7–4.0)
MCH: 28.7 pg (ref 26.0–34.0)
MCHC: 31.6 g/dL (ref 30.0–36.0)
MCV: 90.9 fL (ref 80.0–100.0)
Monocytes Absolute: 0.9 10*3/uL (ref 0.1–1.0)
Monocytes Relative: 13 %
Neutro Abs: 4.5 10*3/uL (ref 1.7–7.7)
Neutrophils Relative %: 62 %
Platelets: 161 10*3/uL (ref 150–400)
RBC: 4.18 MIL/uL — ABNORMAL LOW (ref 4.22–5.81)
RDW: 15.3 % (ref 11.5–15.5)
WBC: 7.2 10*3/uL (ref 4.0–10.5)
nRBC: 0 % (ref 0.0–0.2)

## 2021-08-07 LAB — TSH: TSH: 2.395 u[IU]/mL (ref 0.350–4.500)

## 2021-08-07 LAB — SEDIMENTATION RATE: Sed Rate: 31 mm/hr — ABNORMAL HIGH (ref 0–20)

## 2021-08-07 MED ORDER — IRBESARTAN 150 MG PO TABS: 150.0000 mg | ORAL_TABLET | Freq: Every day | ORAL | 11 refills | Status: DC

## 2021-08-07 NOTE — Patient Instructions (Addendum)
Stop enalapril  ? ?Ibesartan 150 mg one daily and your throat problems should gradually ease up - take one half if too strong  ? ?Be sure to check your oxygen saturations at your highest level of activity  ? ?Please remember to go to the lab and x-ray department  for your tests - we will call you with the results when they are available. ?     ? ?Please schedule a follow up office visit in 4 weeks, call sooner if needed  ?

## 2021-08-07 NOTE — Progress Notes (Signed)
Ronald [name], male    DOB: 17-Oct-1945   MRN: [id number]   Brief patient profile:  23   yowm  quit smoking 1988  s resp sequelae apparent referred to pulmonary clinic in [facility]  08/07/2021 by Dr [name]  for doe with sense of globus  x around 2020   History of Present Illness  08/07/2021  Pulmonary/ 1st office eval/ Ronald [name] / [facility] Complaint  Patient presents with   Follow-up  Dyspnea:  limited by back and hip pain   Cough: max of one half tsp over an entire day but sense that something's stuck in throat builds up all day until he gets that out/ neg ent eval per pt/ no better on max ppi  Sleep: can't tol cpap  SABA use: none   No obvious day to day or daytime variability or assoc excess/ purulent sputum or mucus plugs or hemoptysis or cp or chest tightness, subjective wheeze or overt sinus or hb symptoms.   Sleeping  without nocturnal  or early am exacerbation  of respiratory  c/o's or need for noct saba. Also denies any obvious fluctuation of symptoms with weather or environmental changes or other aggravating or alleviating factors except as outlined above   No unusual exposure hx or h/o childhood pna/ asthma or knowledge of premature birth.  Current Allergies, Complete Past Medical History, Past Surgical History, Family History, and Social History were reviewed in [employer] record.  ROS  The following are not active complaints unless bolded Hoarseness, sore throat, dysphagia, dental problems, itching, sneezing,  nasal congestion or discharge of excess mucus or purulent secretions, ear ache,   fever, chills, sweats, unintended wt loss or wt gain, classically pleuritic or exertional cp,  orthopnea pnd or arm/hand swelling  or leg swelling, presyncope, palpitations, abdominal pain, anorexia, nausea, vomiting, diarrhea  or change in bowel habits or change in bladder habits, change in stools or change in urine, dysuria, hematuria,  rash,  arthralgias, visual complaints, headache, numbness, weakness or ataxia or problems with walking or coordination,  change in mood or  memory.           Past Medical History:  Diagnosis Date   AAA (abdominal aortic aneurysm)    a. 3cm by [place] 2015.   Arthritis    "hips; back" (12/13/2014)   CAD (coronary artery disease) 2007   a. s/p CABG- IMA-LAD, VG-Cx, VG-RCA, VG-diag in 1999. B. sp redo CABG- VG-OM, VG-RCA in 2007 due to VG disease. c. NSTEMI 11/2014 s/p DES to SVG-OM from the Y graft.d. PTCA/DES x 1 distal body of SVG to Diagonal.09/2015   Chronic combined systolic and diastolic CHF (congestive heart failure) ([place])    a. remote EF 40-45% in 2006. b. Normal EF 2014. b. Echo 07/2016 EF 45-50%, grade 1 DD.   Chronic lower back pain    CKD (chronic kidney disease), stage IV (HCC)    COPD (chronic obstructive pulmonary disease) (HCC)    Deafness in left ear    Degenerative disc disease, lumbar    Dilated cardiomyopathy ([place]) 10/07/2015   Emphysema    Esophageal stricture 07/02/1998   EGD   GERD (gastroesophageal reflux disease)    History of gout    "last flareup was in 2007" (12/13/2014)   History of hiatal hernia    Hyperlipidemia    Hypertension    Ischemic cardiomyopathy 2006   EF 40% to 50% by 2D echo in 2006;  Echo 12/31/12: Mild  LVH, EF 50-55%, normal wall motion.    PVC's (premature ventricular contractions)    Renal artery stenosis ([place])    a. noted on CT 2008.   Type II diabetes mellitus (HCC)    Diet control    Walking pneumonia 1990's    Outpatient Medications Prior to Visit  Medication Sig Dispense Refill   ACETAMINOPHEN PO Take 650 mg by mouth. 2 at bedtime     allopurinol (ZYLOPRIM) 100 MG tablet TAKE 1 TABLET BY MOUTH EVERYDAY AT BEDTIME 90 tablet 3   amiodarone (PACERONE) 200 MG tablet TAKE 1 TABLET BY MOUTH  DAILY 90 tablet 2   amLODipine (NORVASC) 10 MG tablet TAKE 1 TABLET BY MOUTH EVERY DAY 90 tablet 1   apixaban (ELIQUIS) 5 MG TABS tablet Take 1 tablet (5 mg total)  by mouth 2 (two) times daily. 180 tablet 3   atorvastatin (LIPITOR) 40 MG tablet TAKE 1 TABLET BY MOUTH EVERY DAY 90 tablet 2   Cholecalciferol (VITAMIN D-3) 125 MCG (5000 UT) TABS Take 2,000 Units by mouth daily.      furosemide (LASIX) 40 MG tablet Take 40 mg by mouth daily as needed for fluid or edema.   6   isosorbide mononitrate (IMDUR) 30 MG 24 hr tablet TAKE 1 TABLET BY MOUTH  DAILY 90 tablet 3   levothyroxine (SYNTHROID) 75 MCG tablet TAKE 1 TABLET BY MOUTH DAILY BEFORE BREAKFAST. 90 tablet 2   loratadine (CLARITIN) 10 MG tablet Take 10 mg by mouth daily as needed for allergies.     magnesium oxide (MAG-OX) 400 (240 Mg) MG tablet TAKE 1 TABLET BY MOUTH TWICE A DAY 180 tablet 0   Melatonin 5 MG TABS Take 1 tablet by mouth at bedtime.     metoprolol tartrate (LOPRESSOR) 100 MG tablet TAKE 1 TABLET BY MOUTH  TWICE DAILY 180 tablet 2   Multiple Vitamin (MULTIVITAMIN PO) Take 1 tablet by mouth daily.     nitroGLYCERIN (NITROSTAT) 0.4 MG SL tablet Place 0.4 mg under the tongue every 5 (five) minutes as needed for chest pain (Up to 3 times).      pantoprazole (PROTONIX) 40 MG tablet TAKE 2 TABLETS BY MOUTH EVERY DAY 60 tablet 3   polyethylene glycol (MIRALAX / GLYCOLAX) 17 g packet Take 17 g by mouth daily.     sodium bicarbonate 650 MG tablet Take 650 mg by mouth 2 (two) times daily. Evening     traZODone (DESYREL) 50 MG tablet Take 0.5-1 tablets (25-50 mg total) by mouth at bedtime as needed for sleep. 30 tablet 6   enalapril (VASOTEC) 20 MG tablet Take 20 mg by mouth daily.     benzonatate (TESSALON) 100 MG capsule Take 1 capsule (100 mg total) by mouth 2 (two) times daily as needed for cough. 20 capsule 0   No facility-administered medications prior to visit.     Objective:     BP 130/82 (BP Location: Left Arm, Patient Position: Sitting, Cuff Size: Normal)    Pulse (!) 52    Temp (!) 97.5 F (36.4 C) (Oral)    Ht '5\' 10"'$  (1.778 m)    Wt 232 lb 9.6 oz (105.5 kg)    SpO2 98%    BMI 33.37  kg/m   SpO2: 98 %  Amb wm nad   HEENT : pt wearing mask not removed for exam due to covid - 19 concerns.   NECK :  without JVD/Nodes/TM/ nl carotid upstrokes bilaterally   LUNGS: no acc muscle  use,  Min barrel  contour chest wall with bilateral  slightly decreased bs s audible wheeze and  without cough on insp or exp maneuvers and min  Hyperresonant  to  percussion bilaterally     CV:  RRR  no s3 or murmur or increase in P2, and no edema   ABD:  quite obese but soft and nontender with pos end  insp Hoover's  in the supine position. No bruits or organomegaly appreciated, bowel sounds nl  MS:   Nl gait/  ext warm without deformities, calf tenderness, cyanosis or clubbing No obvious joint restrictions   SKIN: warm and dry without lesions    NEURO:  alert, approp, nl sensorium with  no motor or cerebellar deficits apparent.      CXR PA and Lateral:   08/07/2021 :    I personally reviewed images and impression is as follows:     CM with minimal scarring RML no def as dz   Labs ordered/ reviewed:      Chemistry      Component Value Date/Time   NA 143 08/07/2021 1536   NA 142 01/22/2021 1618   K 5.2 (H) 08/07/2021 1536   CL 109 08/07/2021 1536   CO2 25 08/07/2021 1536   BUN 45 (H) 08/07/2021 1536   BUN 41 (H) 01/22/2021 1618   CREATININE 3.47 (H) 08/07/2021 1536   CREATININE 2.44 (H) 11/24/2015 1546   GLU 169 09/26/2015 0000      Component Value Date/Time   CALCIUM 9.3 08/07/2021 1536   ALKPHOS 155 (H) 01/22/2021 1618   AST 20 01/22/2021 1618   ALT 17 01/22/2021 1618   BILITOT 0.9 01/22/2021 1618        Lab Results  Component Value Date   WBC 7.2 08/07/2021   HGB 12.0 (L) 08/07/2021   HCT 38.0 (L) 08/07/2021   MCV 90.9 08/07/2021   PLT 161 08/07/2021        Lab Results  Component Value Date   TSH 2.395 08/07/2021        Lab Results  Component Value Date   ESRSEDRATE 31 (H) 08/07/2021   ESRSEDRATE 3 12/03/2014           Assessment   DOE  (dyspnea on exertion) Quit smoking  1988  - onset around 2020 with sense of globus on ACEi > try off 08/07/2021    When respiratory symptoms begin or become refractory well after a patient reports complete smoking cessation,  Especially when this wasn't the case while they were smoking, a red flag is raised based on the work of Dr Kris Mouton which states:  if you quit smoking when your best day FEV1 is still well preserved it is highly unlikely you will progress to severe disease.  That is to say, once the smoking stops,  the symptoms should not suddenly erupt or markedly worsen.  If so, the differential diagnosis should include  obesity/deconditioning,  LPR/Reflux/Aspiration syndromes,  occult CHF, or  especially side effect of medications commonly used in this population like amiodarone and ACEi   Since he's more limited by orthopedic issues than doe, his main "resp symptom" is globus which is likely ACEi related and there is no def evidence at this point of amio toxicity   Patients typically have been on amiodarone for 6-12 months before this complication manifests.  Of note, serial clinical evaluation for symptoms such as cough dyspnea or fevers is  the preferred method of monitoring for pulmonary toxicity because  a decrease in DLCO or lung volumes is a nonspecific for toxicity. Pathologically amiodarone pulmonary toxicity may appear as interstitial pneumonitis, eosinophilic pneumonia, organizing pneumonia, pulmonary fibrosis or less commonly as diffuse alveolar hemorrhage, pulmonary nodules or pleural effusions, but he has none of those features by cxr and no need for HRCT at this point   Risk factors for pulmonary toxicity include age greater than 74, daily dose greater than equal to 400 mg, a ?high cumulative dose, or pre-existing lung disease  So he has risk factors and should monitor sats at peak ex but no further w/u for now.    Hypertensive heart disease without CHF Echo 06/15/2018 The  cavity size was moderately dilated. Systolic  function was moderately to severely reduced. EF  30% to 35% with Mild MR >>   D/c  ACEi due to globus with Creat 3.47   ACE inhibitors are problematic in  pts with airway complaints because  even experienced pulmonologists can't   distinguish ace effects from copd/asthma/pnds/ allergies etc.  By themselves they don't actually cause a problem, much like oxygen can't by itself start a fire, but they certainly serve as a powerful catalyst or enhancer for any "fire"  or inflammatory process in the upper airway, be it caused by an ET  tube or more commonly reflux (especially in the obese or pts with known GERD or who are on biphoshonates) or URI's, due to interference with bradykinin clearance.  The effects of acei on bradykinin levels occurs in 100% of pt's on acei (unless they surreptitiously stop the med!) but the classic cough is only reported in 5%.  This leaves 95% of pts on acei's  with a variety of syndromes including no identifiable symptom in most  vs non-specific symptoms like globus  that wax and wane depending on what other insult is occuring at the level of the upper airway.   rec trial off and replace with equivalent ARB assuming renal function mild hyperkalemia  is no worse than baseline and titrated carefully (wife checking sats)   Will notify renal of change and have him recheck bmet w/in a week     Medical decision making was a moderately  high level of complexity in this case because of a chronic condition/diagnosis with severe exacerbation progression side effects of treatment  requiring extra time for  H and P, chart review, counseling,  and generating customized AVS unique to this office visit and charting.   Each maintenance medication was reviewed in detail including emphasizing most importantly the difference between maintenance and prns and under what circumstances the prns are to be triggered using an action plan format where  appropriate. Please see avs for details which were reviewed in writing by both me and my nurse and patient given a written copy highlighted where appropriate with yellow highlighter for the patient's continued care at home along with an updated version of their medications.  Patient was asked to maintain medication reconciliation by comparing this list to the actual medications being used at home and to contact this office right away if there is a conflict or discrepancy.         Christinia Gully, MD 08/08/2021

## 2021-08-08 ENCOUNTER — Encounter: Payer: Self-pay | Admitting: Internal Medicine

## 2021-08-08 NOTE — Assessment & Plan Note (Addendum)
Echo 06/15/2018 The cavity size was moderately dilated. Systolic ?function was moderately to severely reduced. EF  30% to 35% with Mild MR ?>>   D/c  ACEi due to globus with Creat 3.47  ? ?ACE inhibitors are problematic in  pts with airway complaints because  even experienced pulmonologists can't   distinguish ace effects from copd/asthma/pnds/ allergies etc.  By themselves they don't actually cause a problem, much like oxygen can't by itself start a fire, but they certainly serve as a powerful catalyst or enhancer for any "fire"  or inflammatory process in the upper airway, be it caused by an ET  tube or more commonly reflux (especially in the obese or pts with known GERD or who are on biphoshonates) or URI's, due to interference with bradykinin clearance. ? ?The effects of acei on bradykinin levels occurs in 100% of pt's on acei (unless they surreptitiously stop the med!) but the classic cough is only reported in 5%.  This leaves 95% of pts on acei's  with a variety of syndromes including no identifiable symptom in most  vs non-specific symptoms like globus  that wax and wane depending on what other insult is occuring at the level of the upper airway.  ? ?rec trial off and replace with equivalent ARB assuming renal function mild hyperkalemia  is no worse than baseline and titrated carefully (wife checking sats)  ? ?Will notify renal of change and have him recheck bmet w/in a week ? ? ?  Medical decision making was a moderately  high level of complexity in this case because of a chronic condition/diagnosis with severe exacerbation progression side effects of treatment  requiring extra time for  H and P, chart review, counseling,  and generating customized AVS unique to this office visit and charting. ?  ?Each maintenance medication was reviewed in detail including emphasizing most importantly the difference between maintenance and prns and under what circumstances the prns are to be triggered using an action plan  format where appropriate. Please see avs for details which were reviewed in writing by both me and my nurse and patient given a written copy highlighted where appropriate with yellow highlighter for the patient's continued care at home along with an updated version of their medications.  Patient was asked to maintain medication reconciliation by comparing this list to the actual medications being used at home and to contact this office right away if there is a conflict or discrepancy.   ? ?     ?   ?  ? ?  ?     ?

## 2021-08-08 NOTE — Assessment & Plan Note (Signed)
Quit smoking  1988  ?- onset around 2020 with sense of globus on ACEi > try off 08/07/2021  ? ? ?When respiratory symptoms begin or become refractory well after a patient reports complete smoking cessation,  Especially when this wasn't the case while they were smoking, a red flag is raised based on the work of Dr Kris Mouton which states: ? if you quit smoking when your best day FEV1 is still well preserved it is highly unlikely you will progress to severe disease. ? ?That is to say, once the smoking stops,  the symptoms should not suddenly erupt or markedly worsen.  If so, the differential diagnosis should include  obesity/deconditioning,  LPR/Reflux/Aspiration syndromes,  occult CHF, or  especially side effect of medications commonly used in this population like amiodarone and ACEi  ? ?Since he's more limited by orthopedic issues than doe, his main "resp symptom" is globus which is likely ACEi related and there is no def evidence at this point of amio toxicity  ? ?Patients typically have been on amiodarone for 6-12 months before this complication manifests.  Of note, serial clinical evaluation for symptoms such as cough dyspnea or fevers is  the preferred method of monitoring for pulmonary toxicity because a decrease in DLCO or lung volumes is a nonspecific for toxicity. Pathologically amiodarone pulmonary toxicity may appear as interstitial pneumonitis, eosinophilic pneumonia, organizing pneumonia, pulmonary fibrosis or less commonly as diffuse alveolar hemorrhage, pulmonary nodules or pleural effusions, but he has none of those features by cxr and no need for HRCT at this point  ? ?Risk factors for pulmonary toxicity include age greater than 52, daily dose greater than equal to 400 mg, a ?high cumulative dose, or pre-existing lung disease  So he has risk factors and should monitor sats at peak ex but no further w/u for now. ?

## 2021-08-10 ENCOUNTER — Other Ambulatory Visit: Payer: Self-pay

## 2021-08-10 DIAGNOSIS — R0609 Other forms of dyspnea: Secondary | ICD-10-CM

## 2021-08-12 ENCOUNTER — Other Ambulatory Visit
Admission: RE | Admit: 2021-08-12 | Discharge: 2021-08-12 | Disposition: A | Discharge status: Home / Self Care | Payer: Medicare Other | Attending: Internal Medicine | Admitting: Internal Medicine

## 2021-08-12 DIAGNOSIS — R0609 Other forms of dyspnea: Secondary | ICD-10-CM | POA: Diagnosis not present

## 2021-08-12 LAB — BASIC METABOLIC PANEL
Anion gap: 6 (ref 5–15)
BUN: 38 mg/dL — ABNORMAL HIGH (ref 8–23)
CO2: 25 mmol/L (ref 22–32)
Calcium: 9.1 mg/dL (ref 8.9–10.3)
Chloride: 108 mmol/L (ref 98–111)
Creatinine, Ser: 2.93 mg/dL — ABNORMAL HIGH (ref 0.61–1.24)
GFR, Estimated: 21 mL/min — ABNORMAL LOW (ref >60)
Glucose, Bld: 139 mg/dL — ABNORMAL HIGH (ref 70–99)
Potassium: 4.8 mmol/L (ref 3.5–5.1)
Sodium: 139 mmol/L (ref 135–145)

## 2021-08-19 ENCOUNTER — Other Ambulatory Visit: Payer: Self-pay | Admitting: Family Medicine

## 2021-09-01 ENCOUNTER — Telehealth: Payer: Self-pay

## 2021-09-01 NOTE — Progress Notes (Signed)
? ? ?Chronic Care Management ?Pharmacy Assistant  ? ?Name: Ronald [name]  MRN: [id number] DOB: March 18, 1946 ? ?Reason for Encounter: Hypertension Disease State Call ?  ?Recent office visits:  ?None ID ? ?Recent consult visits:  ?08/07/2021 Christinia Gully, MD (Pulmonology) for Follow-up- Started: Irbesartan 150 mg daily, Stopped: Benzonatate 100 mg, Enalapril Malate 20 mg, lab orders placed, Chest XR order placed, patient to follow-up in 4 weeks ? ?Hospital visits:  ?None in previous 6 months ? ?Medications: ?Outpatient Encounter Medications as of 09/01/2021  ?Medication Sig  ? ACETAMINOPHEN PO Take 650 mg by mouth. 2 at bedtime  ? allopurinol (ZYLOPRIM) 100 MG tablet TAKE 1 TABLET BY MOUTH EVERYDAY AT BEDTIME  ? amiodarone (PACERONE) 200 MG tablet TAKE 1 TABLET BY MOUTH  DAILY  ? amLODipine (NORVASC) 10 MG tablet TAKE 1 TABLET BY MOUTH EVERY DAY  ? apixaban (ELIQUIS) 5 MG TABS tablet Take 1 tablet (5 mg total) by mouth 2 (two) times daily.  ? atorvastatin (LIPITOR) 40 MG tablet TAKE 1 TABLET BY MOUTH EVERY DAY  ? Cholecalciferol (VITAMIN D-3) 125 MCG (5000 UT) TABS Take 2,000 Units by mouth daily.   ? furosemide (LASIX) 40 MG tablet Take 40 mg by mouth daily as needed for fluid or edema.   ? irbesartan (AVAPRO) 150 MG tablet Take 1 tablet (150 mg total) by mouth daily.  ? isosorbide mononitrate (IMDUR) 30 MG 24 hr tablet TAKE 1 TABLET BY MOUTH  DAILY  ? levothyroxine (SYNTHROID) 75 MCG tablet TAKE 1 TABLET BY MOUTH DAILY BEFORE BREAKFAST.  ? loratadine (CLARITIN) 10 MG tablet Take 10 mg by mouth daily as needed for allergies.  ? magnesium oxide (MAG-OX) 400 (240 Mg) MG tablet TAKE 1 TABLET BY MOUTH TWICE A DAY  ? Melatonin 5 MG TABS Take 1 tablet by mouth at bedtime.  ? metoprolol tartrate (LOPRESSOR) 100 MG tablet TAKE 1 TABLET BY MOUTH  TWICE DAILY  ? Multiple Vitamin (MULTIVITAMIN PO) Take 1 tablet by mouth daily.  ? nitroGLYCERIN (NITROSTAT) 0.4 MG SL tablet Place 0.4 mg under the tongue every 5 (five) minutes as  needed for chest pain (Up to 3 times).   ? pantoprazole (PROTONIX) 40 MG tablet TAKE 2 TABLETS BY MOUTH EVERY DAY  ? polyethylene glycol (MIRALAX / GLYCOLAX) 17 g packet Take 17 g by mouth daily.  ? sodium bicarbonate 650 MG tablet Take 650 mg by mouth 2 (two) times daily. Evening  ? traZODone (DESYREL) 50 MG tablet TAKE 0.5-1 TABLETS BY MOUTH AT BEDTIME AS NEEDED FOR SLEEP.  ? ?No facility-administered encounter medications on file as of 09/01/2021.  ? ?Care Gaps: ?BP > 140/90 ?Zoster Vaccine ?COVID-19 Vaccine Booster 3 ? ?Star Rating Drugs: ?Atorvastatin 40 mg last filled on 05/27/2021 for a 90-Day supply with [facility] ?Irbesartan 150 mg last filled on 08/07/2021 for a 90-Day supply with [facility] ? ?Reviewed chart prior to disease state call. Spoke with patient regarding BP ? ?Recent Office Vitals: ?BP Readings from Last 3 Encounters:  ?08/07/21 130/82  ?07/27/21 (!) 156/62  ?07/03/21 (!) 145/64  ? ?Pulse Readings from Last 3 Encounters:  ?08/07/21 (!) 52  ?07/27/21 (!) 53  ?07/03/21 (!) 56  ?  ?Wt Readings from Last 3 Encounters:  ?08/07/21 232 lb 9.6 oz (105.5 kg)  ?07/27/21 232 lb (105.2 kg)  ?07/03/21 231 lb (104.8 kg)  ?  ?Kidney Function ?Lab Results  ?Component Value Date/Time  ? CREATININE 2.93 (H) 08/12/2021 01:51 PM  ? CREATININE 3.47 (H) 08/07/2021  03:36 PM  ? CREATININE 2.44 (H) 11/24/2015 03:46 PM  ? CREATININE 2.17 (H) 10/20/2015 02:18 PM  ? GFRNONAA 21 (L) 08/12/2021 01:51 PM  ? GFRAA 28 (L) 03/20/2020 10:38 AM  ? ? ?  Latest Ref Rng & Units 08/12/2021  ?  1:51 PM 08/07/2021  ?  3:36 PM 01/22/2021  ?  4:18 PM  ?BMP  ?Glucose 70 - 99 mg/dL 139   100   121    ?BUN 8 - 23 mg/dL 38   45   41    ?Creatinine 0.61 - 1.24 mg/dL 2.93   3.47   3.21    ?BUN/Creat Ratio 10 - 24   13    ?Sodium 135 - 145 mmol/L 139   143   142    ?Potassium 3.5 - 5.1 mmol/L 4.8   5.2   5.3    ?Chloride 98 - 111 mmol/L 108   109   105    ?CO2 22 - 32 mmol/L '25   25   21    '$ ?Calcium 8.9 - 10.3 mg/dL 9.1   9.3   9.4     ? ?Current antihypertensive regimen:  ?Irbesartan 150 mg daily ?Metoprolol Tartrate 100 mg ?Amiodarone 200 mg 1 tablet daily ?Amlodipine 10 mg 1 tablet daily ?Imdur 30 mg 1 tablet daily ?Furosemide 40 mg 1 tablet daily prn ? ?How often are you checking your Blood Pressure? infrequently patient stated he really doesn't check his blood pressure at home. He advised he saw his specialist last month and his blood pressure was normal ? ?Current home BP readings: N/A ? ?What recent interventions/DTPs have been made by any provider to improve Blood Pressure control since last CPP Visit: Patient recently was started on Irbesartan 150 mg daily, Patient was discontinued from Enalapril   ? ?Any recent hospitalizations or ED visits since last visit with CPP? No ? ?What diet changes have been made to improve Blood Pressure Control?  ?Patient reports he hasn't changed anything he eats normal ? ?What exercise is being done to improve your Blood Pressure Control?  ?Patient does not exercise due to his breathing  ? ?Adherence Review: ?Is the patient currently on ACE/ARB medication? Yes ?Does the patient have >5 day gap between last estimated fill dates? No ? ?Patient reports that he is doing well today. He denies any ill symptoms at this time. Patient denies any issues with his new medication Irbesartan 150 mg. Patient does state that he is still not sleeping well at night. Patient reports that the Trazodone 50 mg isn't really helping when it comes to his sleep issues. Patient denies needing any refills at this time. Patient has no other concerns or issues at this time. ? ?CPP notified about about the patient's issue with sleeping, and the Trazodone not working. ? ?Patient has a telephone appointment with Junius Argyle, CPP on 12/07/2021 @ 1500 ? ?Lynann Bologna, CPA/CMA ?Clinical Pharmacist Assistant ?Phone: [phone]  ? ? ?

## 2021-09-09 ENCOUNTER — Encounter: Payer: Self-pay | Admitting: Cardiovascular Disease

## 2021-09-09 ENCOUNTER — Other Ambulatory Visit: Payer: Self-pay | Admitting: Pharmacist

## 2021-09-09 MED ORDER — APIXABAN 5 MG PO TABS: 5.0000 mg | ORAL_TABLET | Freq: Two times a day (BID) | ORAL | 1 refills | Status: DC

## 2021-09-10 ENCOUNTER — Other Ambulatory Visit: Payer: Self-pay | Admitting: Family Medicine

## 2021-09-10 DIAGNOSIS — K219 Gastro-esophageal reflux disease without esophagitis: Secondary | ICD-10-CM

## 2021-09-11 NOTE — Telephone Encounter (Signed)
Requested medication (s) are due for refill today:   Yes ? ?Requested medication (s) are on the active medication list:   Yes ? ?Future visit scheduled:   Yes ? ? ?Last ordered: 07/22/2021 #60, 3 refills ? ?Returned because pharmacy requesting a 1 yr supply #180, 3 refills  ? ?Requested Prescriptions  ?Pending Prescriptions Disp Refills  ? pantoprazole (PROTONIX) 40 MG tablet [Pharmacy Med Name: Pantoprazole Sodium 40 MG Oral Tablet Delayed Release] 180 tablet 3  ?  Sig: TAKE 2 TABLETS BY MOUTH DAILY  ?  ? Gastroenterology: Proton Pump Inhibitors Passed - 09/10/2021 10:16 PM  ?  ?  Passed - Valid encounter within last 12 months  ?  Recent Outpatient Visits   ? ?      ? 1 month ago Diabetes mellitus with nephropathy ([place])  ? [facility] [name]., MD  ? 2 months ago Viral URI with cough  ? [facility], Dionne Bucy, MD  ? 5 months ago Dilated cardiomyopathy [facility])  ? [facility] [name]., MD  ? 6 months ago Pneumonitis  ? [facility] [name]., MD  ? 7 months ago Type 2 diabetes mellitus without complication, without long-term current use of insulin ([place])  ? [facility] [name]., MD  ? ?  ?  ?Future Appointments   ? ?        ? In 2 weeks Melvyn Novas, Christena Deem, MD [facility]  ? In 2 months [name]., MD [facility]  ? ?  ? ? ?  ?  ?  ? ?

## 2021-09-22 DIAGNOSIS — H353132 Nonexudative age-related macular degeneration, bilateral, intermediate dry stage: Secondary | ICD-10-CM | POA: Diagnosis not present

## 2021-10-01 ENCOUNTER — Other Ambulatory Visit
Admission: RE | Admit: 2021-10-01 | Discharge: 2021-10-01 | Disposition: A | Discharge status: Home / Self Care | Payer: Medicare Other | Source: Ambulatory Visit | Attending: Internal Medicine | Admitting: Internal Medicine

## 2021-10-01 ENCOUNTER — Ambulatory Visit: Payer: Medicare Other | Admitting: Internal Medicine

## 2021-10-01 ENCOUNTER — Encounter: Payer: Self-pay | Admitting: Internal Medicine

## 2021-10-01 DIAGNOSIS — R0609 Other forms of dyspnea: Secondary | ICD-10-CM | POA: Insufficient documentation

## 2021-10-01 DIAGNOSIS — I119 Hypertensive heart disease without heart failure: Secondary | ICD-10-CM | POA: Diagnosis not present

## 2021-10-01 DIAGNOSIS — R058 Other specified cough: Secondary | ICD-10-CM | POA: Diagnosis not present

## 2021-10-01 LAB — CBC WITH DIFFERENTIAL/PLATELET
Abs Immature Granulocytes: 0.01 10*3/uL (ref 0.00–0.07)
Basophils Absolute: 0.1 10*3/uL (ref 0.0–0.1)
Basophils Relative: 1 %
Eosinophils Absolute: 0.2 10*3/uL (ref 0.0–0.5)
Eosinophils Relative: 4 %
HCT: 38.9 % — ABNORMAL LOW (ref 39.0–52.0)
Hemoglobin: 12.6 g/dL — ABNORMAL LOW (ref 13.0–17.0)
Immature Granulocytes: 0 %
Lymphocytes Relative: 18 %
Lymphs Abs: 1.1 10*3/uL (ref 0.7–4.0)
MCH: 29 pg (ref 26.0–34.0)
MCHC: 32.4 g/dL (ref 30.0–36.0)
MCV: 89.6 fL (ref 80.0–100.0)
Monocytes Absolute: 0.7 10*3/uL (ref 0.1–1.0)
Monocytes Relative: 12 %
Neutro Abs: 3.8 10*3/uL (ref 1.7–7.7)
Neutrophils Relative %: 65 %
Platelets: 175 10*3/uL (ref 150–400)
RBC: 4.34 MIL/uL (ref 4.22–5.81)
RDW: 14.6 % (ref 11.5–15.5)
WBC: 5.9 10*3/uL (ref 4.0–10.5)
nRBC: 0 % (ref 0.0–0.2)

## 2021-10-01 MED ORDER — METHYLPREDNISOLONE ACETATE 80 MG/ML IJ SUSP
120.0000 mg | Freq: Once | INTRAMUSCULAR | Status: AC
  Administered 2021-10-01: 120 mg via INTRAMUSCULAR

## 2021-10-01 NOTE — Progress Notes (Addendum)
? ?Ronald [name], male    DOB: 04/14/1946   MRN: [id number] ? ? ?Brief patient profile:  ?6   yowm  quit smoking 1988  s resp sequelae apparent referred to pulmonary clinic in [facility]  08/07/2021 by Dr [name]  for doe with sense of globus  x around 2020  ? ?History of Present Illness  ?08/07/2021  Pulmonary/ 1st office eval/ Melvyn Novas / [employer]  Office  ?Chief Complaint  ?Patient presents with  ? Follow-up  ?Dyspnea:  limited by back and hip pain   ?Cough: max of one half tsp over an entire day but sense that something's stuck in throat builds up all day until he gets that out/ neg ent eval per pt/ no better on max ppi  ?Sleep: can't tol cpap  ?SABA use: none   ?Rec ?Stop enalapril  ?Ibesartan 150 mg one daily and your throat problems should gradually ease up - take one half if too strong  ?Be sure to check your oxygen saturations at your highest level of activity  ?  ? ? ?10/01/2021  f/u ov/Felipe Paluch/ [facility] re: ?Ace case with CRI    maint on ppi bid   ?Chief Complaint  ?Patient presents with  ? Follow-up  ?  Same as last visit.  Keeps mucous in throat.  ? Dyspnea:  limited by R hip and back pain  ?Cough: no pattern but never disturbs sleep, intermittent tsp white mucus improves globus sensation but does not eliminated it  ?Sleeping: recliner x 7-8 months has not tried back in bed yet due to back  ?SABA use:  none  ?02: none  ?Covid status:   vax x 3  ? ? ?No obvious day to day or daytime variability or assoc excess/ purulent sputum or mucus plugs or hemoptysis or cp or chest tightness, subjective wheeze or overt sinus or hb symptoms.  ?  Also denies any obvious fluctuation of symptoms with weather or environmental changes or other aggravating or alleviating factors except as outlined above  ? ?No unusual exposure hx or h/o childhood pna/ asthma or knowledge of premature birth. ? ?Current Allergies, Complete Past Medical History, Past Surgical History, Family History, and Social History were reviewed in  [employer] record. ? ?ROS  The following are not active complaints unless bolded ?Hoarseness, sore throat, dysphagia, dental problems, itching, sneezing,  nasal congestion or discharge of excess mucus or purulent secretions, ear ache,   fever, chills, sweats, unintended wt loss or wt gain, classically pleuritic or exertional cp,  orthopnea pnd or arm/hand swelling  or leg swelling, presyncope, palpitations, abdominal pain, anorexia, nausea, vomiting, diarrhea  or change in bowel habits or change in bladder habits, change in stools or change in urine, dysuria, hematuria,  rash, arthralgias, visual complaints, headache, numbness, weakness or ataxia or problems with walking or coordination,  change in mood or  memory. ?      ? ?Current Meds  ?Medication Sig  ? ACETAMINOPHEN PO Take 650 mg by mouth. 2 at bedtime  ? allopurinol (ZYLOPRIM) 100 MG tablet TAKE 1 TABLET BY MOUTH EVERYDAY AT BEDTIME  ? amiodarone (PACERONE) 200 MG tablet TAKE 1 TABLET BY MOUTH  DAILY  ? amLODipine (NORVASC) 10 MG tablet TAKE 1 TABLET BY MOUTH EVERY DAY  ? apixaban (ELIQUIS) 5 MG TABS tablet Take 1 tablet (5 mg total) by mouth 2 (two) times daily.  ? atorvastatin (LIPITOR) 40 MG tablet TAKE 1 TABLET BY MOUTH EVERY DAY  ? Cholecalciferol (  VITAMIN D-3) 125 MCG (5000 UT) TABS Take 2,000 Units by mouth daily.   ? furosemide (LASIX) 40 MG tablet Take 40 mg by mouth daily as needed for fluid or edema.   ? irbesartan (AVAPRO) 150 MG tablet Take 1 tablet (150 mg total) by mouth daily.  ? isosorbide mononitrate (IMDUR) 30 MG 24 hr tablet TAKE 1 TABLET BY MOUTH  DAILY  ? levothyroxine (SYNTHROID) 75 MCG tablet TAKE 1 TABLET BY MOUTH DAILY BEFORE BREAKFAST.  ? loratadine (CLARITIN) 10 MG tablet Take 10 mg by mouth daily as needed for allergies.  ? magnesium oxide (MAG-OX) 400 (240 Mg) MG tablet TAKE 1 TABLET BY MOUTH TWICE A DAY  ? Melatonin 5 MG TABS Take 1 tablet by mouth at bedtime.  ? metoprolol tartrate (LOPRESSOR) 100 MG  tablet TAKE 1 TABLET BY MOUTH  TWICE DAILY  ? Multiple Vitamin (MULTIVITAMIN PO) Take 1 tablet by mouth daily.  ? nitroGLYCERIN (NITROSTAT) 0.4 MG SL tablet Place 0.4 mg under the tongue every 5 (five) minutes as needed for chest pain (Up to 3 times).   ? pantoprazole (PROTONIX) 40 MG tablet TAKE 2 TABLETS BY MOUTH DAILY  ? polyethylene glycol (MIRALAX / GLYCOLAX) 17 g packet Take 17 g by mouth daily.  ? sodium bicarbonate 650 MG tablet Take 650 mg by mouth 2 (two) times daily. Evening  ?     ?  ?   ? ?Past Medical History:  ?Diagnosis Date  ? AAA (abdominal aortic aneurysm)   ? a. 3cm by [place] 2015.  ? Arthritis   ? "hips; back" (12/13/2014)  ? CAD (coronary artery disease) 2007  ? a. s/p CABG- IMA-LAD, VG-Cx, VG-RCA, VG-diag in 1999. B. sp redo CABG- VG-OM, VG-RCA in 2007 due to VG disease. c. NSTEMI 11/2014 s/p DES to SVG-OM from the Y graft.d. PTCA/DES x 1 distal body of SVG to Diagonal.09/2015  ? Chronic combined systolic and diastolic CHF (congestive heart failure) ([place])   ? a. remote EF 40-45% in 2006. b. Normal EF 2014. b. Echo 07/2016 EF 45-50%, grade 1 DD.  ? Chronic lower back pain   ? CKD (chronic kidney disease), stage IV ([place])   ? COPD (chronic obstructive pulmonary disease) ([place])   ? Deafness in left ear   ? Degenerative disc disease, lumbar   ? Dilated cardiomyopathy ([place]) 10/07/2015  ? Emphysema   ? Esophageal stricture 07/02/1998  ? EGD  ? GERD (gastroesophageal reflux disease)   ? History of gout   ? "last flareup was in 2007" (12/13/2014)  ? History of hiatal hernia   ? Hyperlipidemia   ? Hypertension   ? Ischemic cardiomyopathy 2006  ? EF 40% to 50% by 2D echo in 2006;  Echo 12/31/12: Mild LVH, EF 50-55%, normal wall motion.   ? PVC's (premature ventricular contractions)   ? Renal artery stenosis ([place])   ? a. noted on CT 2008.  ? Type II diabetes mellitus ([place])   ? Diet control   ? Walking pneumonia 1990's  ?  ? ? ?Objective:  ?  ? ?Wt Readings from Last 3 Encounters:  ?10/01/21 233 lb 3.2 oz (105.8 kg)   ?08/07/21 232 lb 9.6 oz (105.5 kg)  ?07/27/21 232 lb (105.2 kg)  ?  ? ? ?Vital signs reviewed  10/01/2021  - Note at rest 02 sats  97% on RA  ? ?General appearance:    pleasant mod obese wm, vigorous throat clearing     ? ?HEENT : upper denture,  min turbinate edema/ min watery pnd s cobblestoning   ? ?NECK :  without JVD/Nodes/TM/ nl carotid upstrokes bilaterally ? ? ?LUNGS: no acc muscle use,  Min barrel  contour chest wall with bilateral  slightly decreased bs s audible wheeze and  without cough on insp or exp maneuvers and min  Hyperresonant  to  percussion bilaterally   ? ? ?CV:  RRR  no s3 or murmur or increase in P2, and no edema  ? ?ABD:  mod obese soft and nontender with pos end  insp Hoover's  in the supine position. No bruits or organomegaly appreciated, bowel sounds nl ? ?MS:   Nl gait/  ext warm without deformities, calf tenderness, cyanosis or clubbing ?No obvious joint restrictions  ? ?SKIN: warm and dry without lesions   ? ?NEURO:  alert, approp, nl sensorium with  no motor or cerebellar deficits apparent.  ?    ? ? ?   ?Assessment  ? ?  ? ? ?  ? ?  ? ? ?  ?    ?

## 2021-10-01 NOTE — Addendum Note (Signed)
Addended by: Vanessa Barbara on: 10/01/2021 04:59 PM ? ? Modules accepted: Orders ? ?

## 2021-10-01 NOTE — Assessment & Plan Note (Signed)
Echo 06/15/2018 The cavity size was moderately dilated. Systolic ?function was moderately to severely reduced. EF  30% to 35% with Mild MR ?>>   D/c  ACEi due to globus with Creat 3.47  ? ?Lab Results  ?Component Value Date  ? CREATININE 2.93 (H) 08/12/2021  ? CREATININE 3.47 (H) 08/07/2021  ? CREATININE 3.21 (H) 01/22/2021  ?  ?bp good control on avapro 150 mg daily with f/u by renal w/in next week  ? ?Although even in retrospect it may not be clear the ACEi contributed to the pt's symptoms,  adding them back at this point or in the future would risk confusion in interpretation of non-specific respiratory symptoms to which this patient is prone  ie  Better not to muddy the waters here.  ? ? ?    ?  ? ?Each maintenance medication was reviewed in detail including emphasizing most importantly the difference between maintenance and prns and under what circumstances the prns are to be triggered using an action plan format where appropriate. ? ?Total time for H and P, chart review, counseling,   and generating customized AVS unique to this office visit / same day charting  > 30 min final summary ov ?     ?

## 2021-10-01 NOTE — Assessment & Plan Note (Signed)
Onset 2020 while on acei > d/c'd 08/07/21 ?- Allergy screen 10/01/2021 >  Eos 0. /  IgE   ?- 10/01/2021 rx zyrtec, depomedrol 120 and max gerd rx  ? ?Upper airway cough syndrome (previously labeled PNDS),  is so named because it's frequently impossible to sort out how much is  CR/sinusitis with freq throat clearing (which can be related to primary GERD)   vs  causing  secondary (" extra esophageal")  GERD from wide swings in gastric pressure that occur with throat clearing, often  promoting self use of mint and menthol lozenges that reduce the lower esophageal sphincter tone and exacerbate the problem further in a cyclical fashion.  ? ?These are the same pts (now being labeled as having "irritable larynx syndrome" by some cough centers) who not infrequently have a history of having failed to tolerate ace inhibitors,  dry powder inhalers or biphosphonates or report having atypical/extraesophageal reflux symptoms that don't respond to standard doses of PPI  and are easily confused as having aecopd or asthma flares by even experienced allergists/ pulmonologists (myself included).  ? ?rec check allergy scren/  leave off ace/ max gerd rx >>> also  Depomerol 120 mg IM  in case of component of Th-2 driven upper or lower airways inflammation (if cough responds short term only to relapse before return while will on full rx for uacs (as above), then  that would point to allergic rhinitis/ asthma or eos bronchitis as alternative dx)  ? ?F/u allergy vs ENT depending on results and response to rx  ? ?In meantime emphasized need to stop clearing throat by use of non-mint/ non-menthol hard rock candy ? ?Pulmonary f/u is prn  ?

## 2021-10-01 NOTE — Patient Instructions (Addendum)
Stop clariton and try zyrtec  10 mg  one daily in evening  ? ?Change pantoprazole to 40 mg Take 30- 60 min before your first and last meals of the day  ? ? Depomedrol 120 mg IM today ? ?Please remember to go to the lab department   for your tests - we will call you with the results when they are available. ? ? ? ? ?    ? ? ? ? ? ? ? ? ? ?

## 2021-10-05 DIAGNOSIS — R801 Persistent proteinuria, unspecified: Secondary | ICD-10-CM | POA: Diagnosis not present

## 2021-10-05 DIAGNOSIS — R600 Localized edema: Secondary | ICD-10-CM | POA: Diagnosis not present

## 2021-10-05 DIAGNOSIS — N184 Chronic kidney disease, stage 4 (severe): Secondary | ICD-10-CM | POA: Diagnosis not present

## 2021-10-05 DIAGNOSIS — H353132 Nonexudative age-related macular degeneration, bilateral, intermediate dry stage: Secondary | ICD-10-CM | POA: Diagnosis not present

## 2021-10-05 DIAGNOSIS — H2512 Age-related nuclear cataract, left eye: Secondary | ICD-10-CM | POA: Diagnosis not present

## 2021-10-05 DIAGNOSIS — N2581 Secondary hyperparathyroidism of renal origin: Secondary | ICD-10-CM | POA: Diagnosis not present

## 2021-10-05 DIAGNOSIS — I10 Essential (primary) hypertension: Secondary | ICD-10-CM | POA: Diagnosis not present

## 2021-10-05 LAB — HM DIABETES EYE EXAM

## 2021-10-05 LAB — IGE: IgE (Immunoglobulin E), Serum: 88 IU/mL (ref 6–495)

## 2021-10-07 ENCOUNTER — Telehealth: Payer: Self-pay

## 2021-10-07 NOTE — Telephone Encounter (Signed)
Created in error

## 2021-10-08 ENCOUNTER — Telehealth: Payer: Self-pay

## 2021-10-08 NOTE — Progress Notes (Signed)
Chronic Care Management Pharmacy Assistant   Name: Deni Lefever [facility]  MRN: [id number] DOB: [birth year]  Reason for Encounter: Hypertension Disease State Call  Recent office visits:  None ID  Recent consult visits:  10/01/2021 Christinia Gully, ,MD (Pulmonary Disease) for Follow-up- Stopped: Trazodone 50 mg due to patient not taking, Stop clariton and try zyrtec 10 mg  one daily in evening, Change pantoprazole to 40 mg Take 30- 60 min before your first and last meals of the day, No follow-up noted   08/07/2021 Christinia Gully, MD (Pulmonary Disease) for Follow-up- Started: Irbesartan 150 mg daily, Stopped: Benzonatate 100 mg, Enalapril 20 mg, Lab orders placed, Chest XR placed, patient to follow-up in 4 weeks  Hospital visits:  None in previous 6 months  Medications: Outpatient Encounter Medications as of 10/08/2021  Medication Sig   ACETAMINOPHEN PO Take 650 mg by mouth. 2 at bedtime   allopurinol (ZYLOPRIM) 100 MG tablet TAKE 1 TABLET BY MOUTH EVERYDAY AT BEDTIME   amiodarone (PACERONE) 200 MG tablet TAKE 1 TABLET BY MOUTH  DAILY   amLODipine (NORVASC) 10 MG tablet TAKE 1 TABLET BY MOUTH EVERY DAY   apixaban (ELIQUIS) 5 MG TABS tablet Take 1 tablet (5 mg total) by mouth 2 (two) times daily.   atorvastatin (LIPITOR) 40 MG tablet TAKE 1 TABLET BY MOUTH EVERY DAY   Cholecalciferol (VITAMIN D-3) 125 MCG (5000 UT) TABS Take 2,000 Units by mouth daily.    furosemide (LASIX) 40 MG tablet Take 40 mg by mouth daily as needed for fluid or edema.    irbesartan (AVAPRO) 150 MG tablet Take 1 tablet (150 mg total) by mouth daily.   isosorbide mononitrate (IMDUR) 30 MG 24 hr tablet TAKE 1 TABLET BY MOUTH  DAILY   levothyroxine (SYNTHROID) 75 MCG tablet TAKE 1 TABLET BY MOUTH DAILY BEFORE BREAKFAST.   loratadine (CLARITIN) 10 MG tablet Take 10 mg by mouth daily as needed for allergies.   magnesium oxide (MAG-OX) 400 (240 Mg) MG tablet TAKE 1 TABLET BY MOUTH TWICE A DAY   Melatonin 5 MG TABS Take 1 tablet  by mouth at bedtime.   metoprolol tartrate (LOPRESSOR) 100 MG tablet TAKE 1 TABLET BY MOUTH  TWICE DAILY   Multiple Vitamin (MULTIVITAMIN PO) Take 1 tablet by mouth daily.   nitroGLYCERIN (NITROSTAT) 0.4 MG SL tablet Place 0.4 mg under the tongue every 5 (five) minutes as needed for chest pain (Up to 3 times).    pantoprazole (PROTONIX) 40 MG tablet TAKE 2 TABLETS BY MOUTH DAILY   polyethylene glycol (MIRALAX / GLYCOLAX) 17 g packet Take 17 g by mouth daily.   sodium bicarbonate 650 MG tablet Take 650 mg by mouth 2 (two) times daily. Evening   No facility-administered encounter medications on file as of 10/08/2021.   Care Gaps: Zoster Vaccines COVID-19 Vaccine Booster 3 BP> 140/90  Star Rating Drugs: Atorvastatin 40 mg last filled on 08/22/2021 for a 90-Day supply with [facility] Irbesartan 150 mg last filled on 08/07/2021 for a 90-Day supply with [facility]  Reviewed chart prior to disease state call. Spoke with patient regarding BP  Recent Office Vitals: BP Readings from Last 3 Encounters:  10/01/21 120/60  08/07/21 130/82  07/27/21 (!) 156/62   Pulse Readings from Last 3 Encounters:  10/01/21 (!) 58  08/07/21 (!) 52  07/27/21 (!) 53    Wt Readings from Last 3 Encounters:  10/01/21 233 lb 3.2 oz (105.8 kg)  08/07/21 232 lb 9.6 oz (105.5 kg)  07/27/21 232 lb (105.2 kg)   Kidney Function Lab Results  Component Value Date/Time   CREATININE 2.93 (H) 08/12/2021 01:51 PM   CREATININE 3.47 (H) 08/07/2021 03:36 PM   CREATININE 2.44 (H) 11/24/2015 03:46 PM   CREATININE 2.17 (H) 10/20/2015 02:18 PM   GFRNONAA 21 (L) 08/12/2021 01:51 PM   GFRAA 28 (L) 03/20/2020 10:38 AM      Latest Ref Rng & Units 08/12/2021    1:51 PM 08/07/2021    3:36 PM 01/22/2021    4:18 PM  BMP  Glucose 70 - 99 mg/dL 139   100   121    BUN 8 - 23 mg/dL 38   45   41    Creatinine 0.61 - 1.24 mg/dL 2.93   3.47   3.21    BUN/Creat Ratio 10 - 24   13    Sodium 135 - 145 mmol/L 139   143   142     Potassium 3.5 - 5.1 mmol/L 4.8   5.2   5.3    Chloride 98 - 111 mmol/L 108   109   105    CO2 22 - 32 mmol/L '25   25   21    '$ Calcium 8.9 - 10.3 mg/dL 9.1   9.3   9.4    Current antihypertensive regimen:  Amlodipine 10 mg daily  Irbesartan 150 mg 1 tablet daily daily  Furosemide 40 mg daily as needed  Imdur 30 mg daily  Metoprolol 100 mg twice daily  How often are you checking your Blood Pressure?  Per spouse patient doesn't really check it at home but his most recent reading at his last appointment last month was normal she reports it was 120/60  Current home BP readings: N/A  What recent interventions/DTPs have been made by any provider to improve Blood Pressure control since last CPP Visit: Stopped: Enalapril 20 mg, Started: Irbesartan 150 mg daily  Any recent hospitalizations or ED visits since last visit with CPP? No  What diet changes have been made to improve Blood Pressure Control?  No changes to the patient's diet. Per spouse everything is still the same  What exercise is being done to improve your Blood Pressure Control?  Patient does not exercise  Adherence Review: Is the patient currently on ACE/ARB medication? Yes Does the patient have >5 day gap between last estimated fill dates? No  I spoke to the patient's spouse, and she reports that the patient is doing fair. Per spouse the patient doesn't have any ill symptoms at this time. Per spouse the Trazodone was discontinued as it made the patient itch. She does state he is sleeping a little better but not as much as she would like him to.   Per spouse there is currently no issues or side effects since the patient started Irbesartan. She reports that the patient's blood pressure is actually lower than it has been in a long time. Patient is taking all medications as directed with no problems. There are no concerns or issues today.  Patient has a telephone appointment with Junius Argyle, CPP on 12/07/2021 @ 1500.  Lynann Bologna, CPA/CMA Clinical Pharmacist Assistant Phone: [phone]

## 2021-10-13 ENCOUNTER — Encounter: Payer: Self-pay | Admitting: Ophthalmology

## 2021-10-13 DIAGNOSIS — N184 Chronic kidney disease, stage 4 (severe): Secondary | ICD-10-CM | POA: Diagnosis not present

## 2021-10-13 DIAGNOSIS — N2581 Secondary hyperparathyroidism of renal origin: Secondary | ICD-10-CM | POA: Diagnosis not present

## 2021-10-13 DIAGNOSIS — I701 Atherosclerosis of renal artery: Secondary | ICD-10-CM | POA: Diagnosis not present

## 2021-10-13 DIAGNOSIS — R600 Localized edema: Secondary | ICD-10-CM | POA: Diagnosis not present

## 2021-10-13 DIAGNOSIS — R801 Persistent proteinuria, unspecified: Secondary | ICD-10-CM | POA: Diagnosis not present

## 2021-10-13 DIAGNOSIS — I10 Essential (primary) hypertension: Secondary | ICD-10-CM | POA: Diagnosis not present

## 2021-10-15 NOTE — Telephone Encounter (Signed)
Contacted the patient to let him know Dr Alben Spittle recommendation.  He states the Trazodone made him itch.  He states he is not using anything and he is sleeping well.

## 2021-10-19 NOTE — Discharge Instructions (Signed)

## 2021-10-21 ENCOUNTER — Encounter: Payer: Self-pay | Admitting: Ophthalmology

## 2021-10-21 ENCOUNTER — Ambulatory Visit
Admission: RE | Admit: 2021-10-21 | Discharge: 2021-10-21 | Disposition: A | Discharge status: Home / Self Care | Payer: Medicare Other | Attending: Ophthalmology | Admitting: Ophthalmology

## 2021-10-21 ENCOUNTER — Ambulatory Visit: Payer: Medicare Other | Admitting: Anesthesiology

## 2021-10-21 ENCOUNTER — Other Ambulatory Visit: Payer: Self-pay

## 2021-10-21 ENCOUNTER — Encounter
Admission: RE | Disposition: A | Discharge status: Home / Self Care | Payer: Self-pay | Source: Home / Self Care | Attending: Ophthalmology

## 2021-10-21 DIAGNOSIS — Z955 Presence of coronary angioplasty implant and graft: Secondary | ICD-10-CM | POA: Insufficient documentation

## 2021-10-21 DIAGNOSIS — E039 Hypothyroidism, unspecified: Secondary | ICD-10-CM | POA: Diagnosis not present

## 2021-10-21 DIAGNOSIS — I130 Hypertensive heart and chronic kidney disease with heart failure and stage 1 through stage 4 chronic kidney disease, or unspecified chronic kidney disease: Secondary | ICD-10-CM | POA: Diagnosis not present

## 2021-10-21 DIAGNOSIS — E1136 Type 2 diabetes mellitus with diabetic cataract: Secondary | ICD-10-CM | POA: Diagnosis not present

## 2021-10-21 DIAGNOSIS — Z7901 Long term (current) use of anticoagulants: Secondary | ICD-10-CM | POA: Diagnosis not present

## 2021-10-21 DIAGNOSIS — H2512 Age-related nuclear cataract, left eye: Secondary | ICD-10-CM | POA: Insufficient documentation

## 2021-10-21 DIAGNOSIS — I5042 Chronic combined systolic (congestive) and diastolic (congestive) heart failure: Secondary | ICD-10-CM | POA: Diagnosis not present

## 2021-10-21 DIAGNOSIS — E1122 Type 2 diabetes mellitus with diabetic chronic kidney disease: Secondary | ICD-10-CM | POA: Insufficient documentation

## 2021-10-21 DIAGNOSIS — Z951 Presence of aortocoronary bypass graft: Secondary | ICD-10-CM | POA: Diagnosis not present

## 2021-10-21 DIAGNOSIS — I4891 Unspecified atrial fibrillation: Secondary | ICD-10-CM | POA: Diagnosis not present

## 2021-10-21 DIAGNOSIS — K219 Gastro-esophageal reflux disease without esophagitis: Secondary | ICD-10-CM | POA: Insufficient documentation

## 2021-10-21 DIAGNOSIS — Z87891 Personal history of nicotine dependence: Secondary | ICD-10-CM | POA: Diagnosis not present

## 2021-10-21 DIAGNOSIS — H25812 Combined forms of age-related cataract, left eye: Secondary | ICD-10-CM | POA: Diagnosis not present

## 2021-10-21 DIAGNOSIS — N184 Chronic kidney disease, stage 4 (severe): Secondary | ICD-10-CM | POA: Insufficient documentation

## 2021-10-21 HISTORY — DX: Presence of dental prosthetic device (complete) (partial): Z97.2

## 2021-10-21 HISTORY — PX: CATARACT EXTRACTION W/PHACO: SHX586

## 2021-10-21 SURGERY — PHACOEMULSIFICATION, CATARACT, WITH IOL INSERTION
Anesthesia: Monitor Anesthesia Care | Site: Eye | Laterality: Left

## 2021-10-21 MED ORDER — SIGHTPATH DOSE#1 SODIUM HYALURONATE 10 MG/ML IO SOLUTION
PREFILLED_SYRINGE | INTRAOCULAR | Status: DC | PRN
  Administered 2021-10-21: 0.85 mL via INTRAOCULAR

## 2021-10-21 MED ORDER — TETRACAINE HCL 0.5 % OP SOLN
1.0000 [drp] | OPHTHALMIC | Status: DC | PRN
  Administered 2021-10-21 (×3): 1 [drp] via OPHTHALMIC

## 2021-10-21 MED ORDER — MIDAZOLAM HCL 2 MG/2ML IJ SOLN
INTRAMUSCULAR | Status: DC | PRN
  Administered 2021-10-21: 1 mg via INTRAVENOUS

## 2021-10-21 MED ORDER — SIGHTPATH DOSE#1 BSS IO SOLN
INTRAOCULAR | Status: DC | PRN
  Administered 2021-10-21: 87 mL via OPHTHALMIC

## 2021-10-21 MED ORDER — SIGHTPATH DOSE#1 SODIUM HYALURONATE 23 MG/ML IO SOLUTION
PREFILLED_SYRINGE | INTRAOCULAR | Status: DC | PRN
  Administered 2021-10-21: 0.6 mL via INTRAOCULAR

## 2021-10-21 MED ORDER — ARMC OPHTHALMIC DILATING DROPS
1.0000 "application " | OPHTHALMIC | Status: DC | PRN
  Administered 2021-10-21 (×3): 1 via OPHTHALMIC

## 2021-10-21 MED ORDER — BALANCED SALT IO SOLN
INTRAOCULAR | Status: DC | PRN
  Administered 2021-10-21: 1 mL via INTRAOCULAR

## 2021-10-21 MED ORDER — FENTANYL CITRATE (PF) 100 MCG/2ML IJ SOLN
INTRAMUSCULAR | Status: DC | PRN
  Administered 2021-10-21: 50 ug via INTRAVENOUS

## 2021-10-21 MED ORDER — SIGHTPATH DOSE#1 BSS IO SOLN
INTRAOCULAR | Status: DC | PRN
  Administered 2021-10-21: 15 mL

## 2021-10-21 MED ORDER — MOXIFLOXACIN HCL 0.5 % OP SOLN
OPHTHALMIC | Status: DC | PRN
  Administered 2021-10-21: 0.2 mL via OPHTHALMIC

## 2021-10-21 SURGICAL SUPPLY — 14 items
CATARACT SUITE SIGHTPATH (MISCELLANEOUS) ×2 IMPLANT
DISSECTOR HYDRO NUCLEUS 50X22 (MISCELLANEOUS) ×2 IMPLANT
FEE CATARACT SUITE SIGHTPATH (MISCELLANEOUS) ×1 IMPLANT
GLOVE SURG GAMMEX PI TX LF 7.5 (GLOVE) ×2 IMPLANT
GLOVE SURG SYN 8.5  E (GLOVE) ×2
GLOVE SURG SYN 8.5 E (GLOVE) ×1 IMPLANT
GLOVE SURG SYN 8.5 PF PI (GLOVE) ×1 IMPLANT
LENS IOL TECNIS EYHANCE 21.5 (Intraocular Lens) ×1 IMPLANT
NDL FILTER BLUNT 18X1 1/2 (NEEDLE) ×1 IMPLANT
NEEDLE FILTER BLUNT 18X 1/2SAF (NEEDLE) ×1
NEEDLE FILTER BLUNT 18X1 1/2 (NEEDLE) ×1 IMPLANT
SYR 3ML LL SCALE MARK (SYRINGE) ×2 IMPLANT
SYR 5ML LL (SYRINGE) ×2 IMPLANT
WATER STERILE IRR 250ML POUR (IV SOLUTION) ×2 IMPLANT

## 2021-10-21 NOTE — Anesthesia Postprocedure Evaluation (Signed)
Anesthesia Post Note  Patient: Ronald [name] [facility]  Procedure(s) Performed: CATARACT EXTRACTION PHACO AND INTRAOCULAR LENS PLACEMENT (IOC) LEFT 3.94 00:33.7 (Left: Eye)     Patient location during evaluation: PACU Anesthesia Type: MAC Level of consciousness: awake and alert Pain management: pain level controlled Vital Signs Assessment: post-procedure vital signs reviewed and stable Respiratory status: spontaneous breathing Cardiovascular status: blood pressure returned to baseline Postop Assessment: no apparent nausea or vomiting, adequate PO intake and no headache Anesthetic complications: no   No notable events documented.  Adele Barthel Landis Cassaro

## 2021-10-21 NOTE — Op Note (Signed)
OPERATIVE NOTE  Jadan Hinojos [facility] [id number] 10/21/2021   PREOPERATIVE DIAGNOSIS:  Nuclear sclerotic cataract left eye.  H25.12   POSTOPERATIVE DIAGNOSIS:    Nuclear sclerotic cataract left eye.     PROCEDURE:  Phacoemusification with posterior chamber intraocular lens placement of the left eye   LENS:   Implant Name Type Inv. Item Serial No. Manufacturer Lot No. LRB No. Used Action  LENS IOL TECNIS EYHANCE 21.5 - [id number] Intraocular Lens LENS IOL TECNIS EYHANCE 21.5 [id number] [employer]  Left 1 Implanted      Procedure(s): CATARACT EXTRACTION PHACO AND INTRAOCULAR LENS PLACEMENT (IOC) LEFT 3.94 00:33.7 (Left)  DIB00 +21.5   ULTRASOUND TIME: 0 minutes 33 seconds.  CDE 3.94   SURGEON:  Benay Pillow, MD, MPH   ANESTHESIA:  Topical with tetracaine drops augmented with 1% preservative-free intracameral lidocaine.  ESTIMATED BLOOD LOSS: <1 mL   COMPLICATIONS:  None.   DESCRIPTION OF PROCEDURE:  The patient was identified in the holding room and transported to the operating room and placed in the supine position under the operating microscope.  The left eye was identified as the operative eye and it was prepped and draped in the usual sterile ophthalmic fashion.   A 1.0 millimeter clear-corneal paracentesis was made at the 5:00 position. 0.5 ml of preservative-free 1% lidocaine with epinephrine was injected into the anterior chamber.  The anterior chamber was filled with Healon 5 viscoelastic.  A 2.4 millimeter keratome was used to make a near-clear corneal incision at the 2:00 position.  A curvilinear capsulorrhexis was made with a cystotome and capsulorrhexis forceps.  Balanced salt solution was used to hydrodissect and hydrodelineate the nucleus.   Phacoemulsification was then used in stop and chop fashion to remove the lens nucleus and epinucleus.  The remaining cortex was then removed using the irrigation and aspiration handpiece. Healon was then placed into the capsular bag to  distend it for lens placement.  A lens was then injected into the capsular bag.  The remaining viscoelastic was aspirated.   Wounds were hydrated with balanced salt solution.  The anterior chamber was inflated to a physiologic pressure with balanced salt solution.  Intracameral vigamox 0.1 mL undiltued was injected into the eye and a drop placed onto the ocular surface.  No wound leaks were noted.  The patient was taken to the recovery room in stable condition without complications of anesthesia or surgery  Benay Pillow 10/21/2021, 10:03 AM

## 2021-10-21 NOTE — Transfer of Care (Signed)
Immediate Anesthesia Transfer of Care Note  Patient: Ronald [name] [facility]  Procedure(s) Performed: CATARACT EXTRACTION PHACO AND INTRAOCULAR LENS PLACEMENT (IOC) LEFT 3.94 00:33.7 (Left: Eye)  Patient Location: PACU  Anesthesia Type: MAC  Level of Consciousness: awake, alert  and patient cooperative  Airway and Oxygen Therapy: Patient Spontanous Breathing and Patient connected to supplemental oxygen  Post-op Assessment: Post-op Vital signs reviewed, Patient's Cardiovascular Status Stable, Respiratory Function Stable, Patent Airway and No signs of Nausea or vomiting  Post-op Vital Signs: Reviewed and stable  Complications: No notable events documented.

## 2021-10-21 NOTE — Anesthesia Preprocedure Evaluation (Addendum)
Anesthesia Evaluation  Patient identified by MRN, date of birth, ID band Patient awake    History of Anesthesia Complications Negative for: history of anesthetic complications  Airway Mallampati: II  TM Distance: >3 FB Neck ROM: Full    Dental  (+) Upper Dentures,    Pulmonary sleep apnea , COPD, former smoker,    Pulmonary exam normal        Cardiovascular hypertension, Pt. on medications + Cardiac Stents (stents in 2016 and 2017), + CABG (CABG 1996 with redo 2007) and +CHF  Normal cardiovascular exam+ dysrhythmias (developed in 2020, on Eliquis) Atrial Fibrillation   2020 Echo  Study Conclusions   - Left ventricle: The cavity size was moderately dilated. Systolic  function was moderately to severely reduced. The estimated  ejection fraction was in the range of 30% to 35%. Diffuse  hypokinesis. The study is not technically sufficient to allow  evaluation of LV diastolic function.  - Mitral valve: There was mild regurgitation.  - Left atrium: The atrium was mildly dilated.  - Right ventricle: The cavity size was mildly dilated. Wall  thickness was normal. Systolic function was mildly reduced.   Neuro/Psych negative neurological ROS     GI/Hepatic GERD  Medicated and Controlled,  Endo/Other  diabetesHypothyroidism   Renal/GU CRF and Renal InsufficiencyRenal disease (renal artery stenosis, stage 4 CKD)     Musculoskeletal negative musculoskeletal ROS (+)   Abdominal   Peds  Hematology negative hematology ROS (+)   Anesthesia Other Findings   Reproductive/Obstetrics negative OB ROS                            Anesthesia Physical Anesthesia Plan  ASA: 3  Anesthesia Plan: MAC   Post-op Pain Management: Minimal or no pain anticipated   Induction:   PONV Risk Score and Plan: 1 and Treatment may vary due to age or medical condition, TIVA and Midazolam  Airway Management  Planned: Nasal Cannula and Natural Airway  Additional Equipment: None  Intra-op Plan:   Post-operative Plan:   Informed Consent: I have reviewed the patients History and Physical, chart, labs and discussed the procedure including the risks, benefits and alternatives for the proposed anesthesia with the patient or authorized representative who has indicated his/her understanding and acceptance.       Plan Discussed with: CRNA  Anesthesia Plan Comments: (Patient and wife report that patient took a nitro tablet this past weekend, which is out of the ordinary for him. However they note that he ate a large chili dog with raw onions that evening, and think that likely this is what had triggered the discomfort that caused him to take a nitroclycerin. He has felt like his usual self since the weekend and has been active and without chest pain or discomfort. In the past when he is having cardiac issues he is not able to function at his baseline. Both he and his wife feel that whatever his discomfort was from this weekend was likely not cardiac. I have a very long discussion with the patient and his wife about cardiac risk with surgery, and that if this was any surgery or procedure besides a cataract removal I would want him to see his cardiologist prior to the procedure. However, as he has not needed to stop his Eliquis, and he feels confident that his symptoms are not cardiac (and he is well versed in cardiac symptoms), I and the patient and his wife felt comfortable  moving forward with his surgery today. His wife will call his cardiologist this week. )       Anesthesia Quick Evaluation

## 2021-10-21 NOTE — H&P (Signed)
[facility]   Primary Care Physician:  [name]., MD Ophthalmologist: Dr. Benay Pillow  Pre-Procedure History & Physical: HPI:  Ronald [name] is a 76 y.o. male here for cataract surgery.   Past Medical History:  Diagnosis Date   AAA (abdominal aortic aneurysm) ([place])    a. 3cm by [place] 2015.   Arthritis    "hips; back" (12/13/2014)   CAD (coronary artery disease) 2007   a. s/p CABG- IMA-LAD, VG-Cx, VG-RCA, VG-diag in 1999. B. sp redo CABG- VG-OM, VG-RCA in 2007 due to VG disease. c. NSTEMI 11/2014 s/p DES to SVG-OM from the Y graft.d. PTCA/DES x 1 distal body of SVG to Diagonal.09/2015   Chronic combined systolic and diastolic CHF (congestive heart failure) ([place])    a. remote EF 40-45% in 2006. b. Normal EF 2014. b. Echo 07/2016 EF 45-50%, grade 1 DD. c. Echo 2020 30% to 35%. Diffuse EF 30-35%, diffuse hypokinesis.   Chronic lower back pain    CKD (chronic kidney disease), stage IV (HCC)    COPD (chronic obstructive pulmonary disease) (HCC)    Deafness in left ear    Degenerative disc disease, lumbar    Dilated cardiomyopathy ([place]) 10/07/2015   Emphysema    Esophageal stricture 07/02/1998   EGD   GERD (gastroesophageal reflux disease)    History of gout    "last flareup was in 2007" (12/13/2014)   History of hiatal hernia    Hyperlipidemia    Hypertension    Ischemic cardiomyopathy 2006   Echo 2020: EF 30-35%, diffuse hypokinesis   PVC's (premature ventricular contractions)    Renal artery stenosis ([place])    a. noted on CT 2008.   Type II diabetes mellitus (HCC)    Diet control    Walking pneumonia 1990's   Wears dentures    full upper    Past Surgical History:  Procedure Laterality Date   CARDIAC CATHETERIZATION  "several"   CARDIAC CATHETERIZATION N/A 12/13/2014   Procedure: Left Heart Cath and Coronary Angiography;  Surgeon: Jettie Booze, MD;  Location: [place] [facility];  Service: Cardiovascular;  Laterality: N/A;   CARDIAC CATHETERIZATION   12/13/2014   Procedure: Coronary Stent Intervention;  Surgeon: Jettie Booze, MD;  Location: [place] [facility];  Service: Cardiovascular;;   CARDIAC CATHETERIZATION N/A 10/07/2015   Procedure: Left Heart Cath and Cors/Grafts Angiography;  Surgeon: Burnell Blanks, MD;  Location: [place] [facility];  Service: Cardiovascular;  Laterality: N/A;   CARDIAC CATHETERIZATION N/A 10/07/2015   Procedure: Coronary Stent Intervention;  Surgeon: Burnell Blanks, MD;  Location: [place] [facility];  Service: Cardiovascular;  Laterality: N/A;   CORONARY ANGIOPLASTY  "several"   CORONARY ANGIOPLASTY WITH STENT PLACEMENT  2005; 12/13/2014   "2; 1"   CORONARY ARTERY BYPASS GRAFT  1996   CABG X5   CORONARY ARTERY BYPASS GRAFT  March 2007   CABG X3   ESOPHAGOGASTRODUODENOSCOPY (EGD) WITH ESOPHAGEAL DILATION  2000   GREEN LIGHT LASER TURP (TRANSURETHRAL RESECTION OF PROSTATE  2000's   "not cancerous"   HERNIA REPAIR     LAPAROSCOPIC CHOLECYSTECTOMY     LEFT HEART CATH AND CORS/GRAFTS ANGIOGRAPHY N/A 07/28/2017   Procedure: LEFT HEART CATH AND CORS/GRAFTS ANGIOGRAPHY;  Surgeon: Troy Sine, MD;  Location: [place] [facility];  Service: Cardiovascular;  Laterality: N/A;   LUNG SURGERY  1996   "S/P CABG, had to put staple in lung after it had collapsed"   UMBILICAL  HERNIA REPAIR     [name]/chole    Prior to Admission medications   Medication Sig Start Date End Date Taking? Authorizing Provider  ACETAMINOPHEN PO Take 650 mg by mouth. 2 at bedtime   Yes [provider]  allopurinol (ZYLOPRIM) 100 MG tablet TAKE 1 TABLET BY MOUTH EVERYDAY AT BEDTIME 07/13/21  Yes [name]., MD  amiodarone (PACERONE) 200 MG tablet TAKE 1 TABLET BY MOUTH  DAILY 06/22/21  Yes Burnell Blanks, MD  amLODipine (NORVASC) 10 MG tablet TAKE 1 TABLET BY MOUTH EVERY DAY 05/05/21  Yes Rumball, Jake Church, DO  apixaban (ELIQUIS) 5 MG TABS tablet Take 1 tablet (5 mg total) by mouth 2 (two) times daily.  09/09/21  Yes Burnell Blanks, MD  atorvastatin (LIPITOR) 40 MG tablet TAKE 1 TABLET BY MOUTH EVERY DAY 05/27/21  Yes [name]., MD  Cholecalciferol (VITAMIN D-3) 125 MCG (5000 UT) TABS Take 2,000 Units by mouth daily.    Yes [provider]  furosemide (LASIX) 40 MG tablet Take 40 mg by mouth daily as needed for fluid or edema.  05/05/17  Yes [provider]  irbesartan (AVAPRO) 150 MG tablet Take 1 tablet (150 mg total) by mouth daily. 08/07/21  Yes Tanda Rockers, MD  isosorbide mononitrate (IMDUR) 30 MG 24 hr tablet TAKE 1 TABLET BY MOUTH  DAILY 12/26/20  Yes Burnell Blanks, MD  levothyroxine (SYNTHROID) 75 MCG tablet TAKE 1 TABLET BY MOUTH DAILY BEFORE BREAKFAST. 06/30/21  Yes [name]., MD  loratadine (CLARITIN) 10 MG tablet Take 10 mg by mouth daily as needed for allergies.   Yes [provider]  magnesium oxide (MAG-OX) 400 (240 Mg) MG tablet TAKE 1 TABLET BY MOUTH TWICE A DAY 08/05/21  Yes [name]., MD  metoprolol tartrate (LOPRESSOR) 100 MG tablet TAKE 1 TABLET BY MOUTH  TWICE DAILY 06/22/21  Yes Burnell Blanks, MD  Multiple Vitamin (MULTIVITAMIN PO) Take 1 tablet by mouth daily.   Yes [provider]  nitroGLYCERIN (NITROSTAT) 0.4 MG SL tablet Place 0.4 mg under the tongue every 5 (five) minutes as needed for chest pain (Up to 3 times).    Yes [provider]  pantoprazole (PROTONIX) 40 MG tablet TAKE 2 TABLETS BY MOUTH DAILY 09/11/21  Yes [name]., MD  polyethylene glycol (MIRALAX / GLYCOLAX) 17 g packet Take 17 g by mouth daily.   Yes [provider]  sodium bicarbonate 650 MG tablet Take 650 mg by mouth 2 (two) times daily. Evening   Yes [provider]    Allergies as of 09/24/2021 - Review Complete 08/08/2021  Allergen Reaction Noted   Predicort [prednisolone] Other (See Comments) 07/07/2017   Ciprofloxacin  04/18/2014   Hydrochlorothiazide Other  (See Comments) 08/15/2008   Hydrocodone  08/15/2008   Hydrocodone-acetaminophen  08/15/2008   Hydrocodone-acetaminophen Nausea Only 04/18/2014   Loratadine Other (See Comments) 08/09/2016   Sulfa antibiotics Other (See Comments) 10/04/2014   Sulfacetamide sodium Other (See Comments) 12/03/2014   Sulfasalazine  10/04/2014   Hydrocodone-acetaminophen Nausea Only 10/24/2012   Penicillins Hives and Rash 08/15/2008    Family History  Problem Relation Age of Onset   Heart attack Mother        MI   Stroke Mother    Heart disease Mother    Hypertension Mother    Hyperlipidemia Mother    Heart disease Father    Rheumatic fever Father  Colon cancer Neg Hx     Social History   Socioeconomic History   Marital status: Married    Spouse name: Not on file   Number of children: 2   Years of education: Not on file   Highest education level: 8th grade  Occupational History   Occupation: Retired  Tobacco Use   Smoking status: Former    Packs/day: 3.00    Years: 20.00    Pack years: 60.00    Types: Cigarettes    Quit date: 07/25/1986    Years since quitting: 35.2   Smokeless tobacco: Never  Vaping Use   Vaping Use: Never used  Substance and Sexual Activity   Alcohol use: No    Alcohol/week: 0.0 standard drinks   Drug use: No   Sexual activity: Not Currently  Other Topics Concern   Not on file  Social History Narrative   Did auto salvage work.   Lives at home with his wife.  Independent at baseline.   Social Determinants of Health   Financial Resource Strain: Low Risk    Difficulty of Paying Living Expenses: Not hard at all  Food Insecurity: Not on file  Transportation Needs: Not on file  Physical Activity: Not on file  Stress: Not on file  Social Connections: Not on file  Intimate Partner Violence: Not on file    Review of Systems: See HPI, otherwise negative ROS  Physical Exam: BP (!) 188/89   Pulse (!) 58   Temp 98.7 F (37.1 C) (Temporal)   Resp 16   Ht  '5\' 9"'$  (1.753 m)   Wt 105.8 kg   SpO2 99%   BMI 34.45 kg/m  General:   Alert, cooperative in NAD Head:  Normocephalic and atraumatic. Respiratory:  Normal work of breathing. Cardiovascular:  RRR  Impression/Plan: Ronald [name] [facility] is here for cataract surgery.  Risks, benefits, limitations, and alternatives regarding cataract surgery have been reviewed with the patient.  Questions have been answered.  All parties agreeable.   Benay Pillow, MD  10/21/2021, 9:38 AM

## 2021-10-21 NOTE — Anesthesia Procedure Notes (Signed)
Procedure Name: MAC Date/Time: 10/21/2021 9:46 AM Performed by: Jeannene Patella, CRNA Pre-anesthesia Checklist: Patient identified, Emergency Drugs available, Suction available, Timeout performed and Patient being monitored Patient Re-evaluated:Patient Re-evaluated prior to induction Oxygen Delivery Method: Nasal cannula Placement Confirmation: positive ETCO2

## 2021-10-22 ENCOUNTER — Encounter: Payer: Self-pay | Admitting: Ophthalmology

## 2021-10-27 ENCOUNTER — Ambulatory Visit: Payer: Medicare Other | Admitting: Physician Assistant

## 2021-10-28 ENCOUNTER — Encounter: Payer: Self-pay | Admitting: Ophthalmology

## 2021-10-28 ENCOUNTER — Ambulatory Visit (INDEPENDENT_AMBULATORY_CARE_PROVIDER_SITE_OTHER): Payer: Medicare Other

## 2021-10-28 VITALS — Wt 233.0 lb

## 2021-10-28 DIAGNOSIS — Z0000 Encounter for general adult medical examination without abnormal findings: Secondary | ICD-10-CM

## 2021-10-28 NOTE — Patient Instructions (Signed)
[name] , Thank you for taking time to come for your Medicare Wellness Visit. I appreciate your ongoing commitment to your health goals. Please review the following plan we discussed and let me know if I can assist you in the future.   Screening recommendations/referrals: Colonoscopy: aged out Recommended yearly ophthalmology/optometry visit for glaucoma screening and checkup Recommended yearly dental visit for hygiene and checkup  Vaccinations: Influenza vaccine: 02/25/21 Pneumococcal vaccine: 12/25/13 Tdap vaccine: 04/16/16 Shingles vaccine: n/d   Covid-19: 07/28/19, 08/25/19  Advanced directives: no  Conditions/risks identified: none  Next appointment: Follow up in one year for your annual wellness visit. 11/01/22 @ 10:15am by phone  Preventive Care 65 Years and Older, Male Preventive care refers to lifestyle choices and visits with your health care provider that can promote health and wellness. What does preventive care include? A yearly physical exam. This is also called an annual well check. Dental exams once or twice a year. Routine eye exams. Ask your health care provider how often you should have your eyes checked. Personal lifestyle choices, including: Daily care of your teeth and gums. Regular physical activity. Eating a healthy diet. Avoiding tobacco and drug use. Limiting alcohol use. Practicing safe sex. Taking low doses of aspirin every day. Taking vitamin and mineral supplements as recommended by your health care provider. What happens during an annual well check? The services and screenings done by your health care provider during your annual well check will depend on your age, overall health, lifestyle risk factors, and family history of disease. Counseling  Your health care provider may ask you questions about your: Alcohol use. Tobacco use. Drug use. Emotional well-being. Home and relationship well-being. Sexual activity. Eating habits. History of  falls. Memory and ability to understand (cognition). Work and work Statistician. Screening  You may have the following tests or measurements: Height, weight, and BMI. Blood pressure. Lipid and cholesterol levels. These may be checked every 5 years, or more frequently if you are over 58 years old. Skin check. Lung cancer screening. You may have this screening every year starting at age 61 if you have a 30-pack-year history of smoking and currently smoke or have quit within the past 15 years. Fecal occult blood test (FOBT) of the stool. You may have this test every year starting at age 92. Flexible sigmoidoscopy or colonoscopy. You may have a sigmoidoscopy every 5 years or a colonoscopy every 10 years starting at age 58. Prostate cancer screening. Recommendations will vary depending on your family history and other risks. Hepatitis C blood test. Hepatitis B blood test. Sexually transmitted disease (STD) testing. Diabetes screening. This is done by checking your blood sugar (glucose) after you have not eaten for a while (fasting). You may have this done every 1-3 years. Abdominal aortic aneurysm (AAA) screening. You may need this if you are a current or former smoker. Osteoporosis. You may be screened starting at age 39 if you are at high risk. Talk with your health care provider about your test results, treatment options, and if necessary, the need for more tests. Vaccines  Your health care provider may recommend certain vaccines, such as: Influenza vaccine. This is recommended every year. Tetanus, diphtheria, and acellular pertussis (Tdap, Td) vaccine. You may need a Td booster every 10 years. Zoster vaccine. You may need this after age 31. Pneumococcal 13-valent conjugate (PCV13) vaccine. One dose is recommended after age 78. Pneumococcal polysaccharide (PPSV23) vaccine. One dose is recommended after age 79. Talk to your health care provider  about which screenings and vaccines you need and  how often you need them. This information is not intended to replace advice given to you by your health care provider. Make sure you discuss any questions you have with your health care provider. Document Released: 06/13/2015 Document Revised: 02/04/2016 Document Reviewed: 03/18/2015 Elsevier Interactive Patient Education  2017 [place] Prevention in the Home Falls can cause injuries. They can happen to people of all ages. There are many things you can do to make your home safe and to help prevent falls. What can I do on the outside of my home? Regularly fix the edges of walkways and driveways and fix any cracks. Remove anything that might make you trip as you walk through a door, such as a raised step or threshold. Trim any bushes or trees on the path to your home. Use bright outdoor lighting. Clear any walking paths of anything that might make someone trip, such as rocks or tools. Regularly check to see if handrails are loose or broken. Make sure that both sides of any steps have handrails. Any raised decks and porches should have guardrails on the edges. Have any leaves, snow, or ice cleared regularly. Use sand or salt on walking paths during winter. Clean up any spills in your garage right away. This includes oil or grease spills. What can I do in the bathroom? Use night lights. Install grab bars by the toilet and in the tub and shower. Do not use towel bars as grab bars. Use non-skid mats or decals in the tub or shower. If you need to sit down in the shower, use a plastic, non-slip stool. Keep the floor dry. Clean up any water that spills on the floor as soon as it happens. Remove soap buildup in the tub or shower regularly. Attach bath mats securely with double-sided non-slip rug tape. Do not have throw rugs and other things on the floor that can make you trip. What can I do in the bedroom? Use night lights. Make sure that you have a light by your bed that is easy to  reach. Do not use any sheets or blankets that are too big for your bed. They should not hang down onto the floor. Have a firm chair that has side arms. You can use this for support while you get dressed. Do not have throw rugs and other things on the floor that can make you trip. What can I do in the kitchen? Clean up any spills right away. Avoid walking on wet floors. Keep items that you use a lot in easy-to-reach places. If you need to reach something above you, use a strong step stool that has a grab bar. Keep electrical cords out of the way. Do not use floor polish or wax that makes floors slippery. If you must use wax, use non-skid floor wax. Do not have throw rugs and other things on the floor that can make you trip. What can I do with my stairs? Do not leave any items on the stairs. Make sure that there are handrails on both sides of the stairs and use them. Fix handrails that are broken or loose. Make sure that handrails are as long as the stairways. Check any carpeting to make sure that it is firmly attached to the stairs. Fix any carpet that is loose or worn. Avoid having throw rugs at the top or bottom of the stairs. If you do have throw rugs, attach them to the floor  with carpet tape. Make sure that you have a light switch at the top of the stairs and the bottom of the stairs. If you do not have them, ask someone to add them for you. What else can I do to help prevent falls? Wear shoes that: Do not have high heels. Have rubber bottoms. Are comfortable and fit you well. Are closed at the toe. Do not wear sandals. If you use a stepladder: Make sure that it is fully opened. Do not climb a closed stepladder. Make sure that both sides of the stepladder are locked into place. Ask someone to hold it for you, if possible. Clearly mark and make sure that you can see: Any grab bars or handrails. First and last steps. Where the edge of each step is. Use tools that help you move  around (mobility aids) if they are needed. These include: Canes. Walkers. Scooters. Crutches. Turn on the lights when you go into a dark area. Replace any light bulbs as soon as they burn out. Set up your furniture so you have a clear path. Avoid moving your furniture around. If any of your floors are uneven, fix them. If there are any pets around you, be aware of where they are. Review your medicines with your doctor. Some medicines can make you feel dizzy. This can increase your chance of falling. Ask your doctor what other things that you can do to help prevent falls. This information is not intended to replace advice given to you by your health care provider. Make sure you discuss any questions you have with your health care provider. Document Released: 03/13/2009 Document Revised: 10/23/2015 Document Reviewed: 06/21/2014 Elsevier Interactive Patient Education  2017 [employer].

## 2021-10-28 NOTE — Progress Notes (Signed)
Virtual Visit via Telephone Note  I connected with  Ronald [name] on 10/28/21 at 11:00 AM EDT by telephone and verified that I am speaking with the correct person using two identifiers. Spoke w/ wife  Location: Patient: home Provider: [facility] Persons participating in the virtual visit: patient/Nurse Health Advisor   I discussed the limitations, risks, security and privacy concerns of performing an evaluation and management service by telephone and the availability of in person appointments. The patient expressed understanding and agreed to proceed.  Interactive audio and video telecommunications were attempted between this nurse and patient, however failed, due to patient having technical difficulties OR patient did not have access to video capability.  We continued and completed visit with audio only.  Some vital signs may be absent or patient reported.   Dionisio David, LPN  Subjective:   Ronald [name] is a 76 y.o. male who presents for Medicare Annual/Subsequent preventive examination.  Review of Systems           Objective:    There were no vitals filed for this visit. There is no height or weight on file to calculate BMI.     10/28/2021   11:03 AM 04/28/2020   12:13 PM 04/19/2019    2:48 PM 06/10/2018   12:30 PM 04/17/2018    2:59 PM 07/27/2017    4:10 PM 07/08/2017   12:46 PM  Advanced Directives  Does Patient Have a Medical Advance Directive? No No No No Yes No No  Type of Advance Directive     Living will    Would patient like information on creating a medical advance directive? No - Patient declined No - Patient declined No - Patient declined No - Patient declined  No - Patient declined     Current Medications (verified) Outpatient Encounter Medications as of 10/28/2021  Medication Sig   ACETAMINOPHEN PO Take 650 mg by mouth. 2 at bedtime   allopurinol (ZYLOPRIM) 100 MG tablet TAKE 1 TABLET BY MOUTH EVERYDAY AT BEDTIME   amiodarone (PACERONE) 200 MG tablet TAKE 1  TABLET BY MOUTH  DAILY   amLODipine (NORVASC) 10 MG tablet TAKE 1 TABLET BY MOUTH EVERY DAY   apixaban (ELIQUIS) 5 MG TABS tablet Take 1 tablet (5 mg total) by mouth 2 (two) times daily.   atorvastatin (LIPITOR) 40 MG tablet TAKE 1 TABLET BY MOUTH EVERY DAY   Cholecalciferol (VITAMIN D-3) 125 MCG (5000 UT) TABS Take 2,000 Units by mouth daily.    furosemide (LASIX) 40 MG tablet Take 40 mg by mouth daily as needed for fluid or edema.    irbesartan (AVAPRO) 150 MG tablet Take 1 tablet (150 mg total) by mouth daily.   isosorbide mononitrate (IMDUR) 30 MG 24 hr tablet TAKE 1 TABLET BY MOUTH  DAILY   levothyroxine (SYNTHROID) 75 MCG tablet TAKE 1 TABLET BY MOUTH DAILY BEFORE BREAKFAST.   loratadine (CLARITIN) 10 MG tablet Take 10 mg by mouth daily as needed for allergies.   magnesium oxide (MAG-OX) 400 (240 Mg) MG tablet TAKE 1 TABLET BY MOUTH TWICE A DAY   metoprolol tartrate (LOPRESSOR) 100 MG tablet TAKE 1 TABLET BY MOUTH  TWICE DAILY   Multiple Vitamin (MULTIVITAMIN PO) Take 1 tablet by mouth daily.   nitroGLYCERIN (NITROSTAT) 0.4 MG SL tablet Place 0.4 mg under the tongue every 5 (five) minutes as needed for chest pain (Up to 3 times).    pantoprazole (PROTONIX) 40 MG tablet TAKE 2 TABLETS BY MOUTH DAILY  polyethylene glycol (MIRALAX / GLYCOLAX) 17 g packet Take 17 g by mouth daily.   sodium bicarbonate 650 MG tablet Take 650 mg by mouth 2 (two) times daily. Evening   [DISCONTINUED] MIRALAX 17 GM/SCOOP powder    No facility-administered encounter medications on file as of 10/28/2021.    Allergies (verified) Predicort [prednisolone], Ciprofloxacin, Hydrochlorothiazide, Hydrocodone, Hydrocodone-acetaminophen, Sulfa antibiotics, Sulfacetamide sodium, Sulfasalazine, and Penicillins   History: Past Medical History:  Diagnosis Date   AAA (abdominal aortic aneurysm) ([place])    a. 3cm by [place] 2015.   Arthritis    "hips; back" (12/13/2014)   CAD (coronary artery disease) 2007   a. s/p CABG-  IMA-LAD, VG-Cx, VG-RCA, VG-diag in 1999. B. sp redo CABG- VG-OM, VG-RCA in 2007 due to VG disease. c. NSTEMI 11/2014 s/p DES to SVG-OM from the Y graft.d. PTCA/DES x 1 distal body of SVG to Diagonal.09/2015   Chronic combined systolic and diastolic CHF (congestive heart failure) ([place])    a. remote EF 40-45% in 2006. b. Normal EF 2014. b. Echo 07/2016 EF 45-50%, grade 1 DD. c. Echo 2020 30% to 35%. Diffuse EF 30-35%, diffuse hypokinesis.   Chronic lower back pain    CKD (chronic kidney disease), stage IV (HCC)    COPD (chronic obstructive pulmonary disease) (HCC)    Deafness in left ear    Degenerative disc disease, lumbar    Dilated cardiomyopathy ([place]) 10/07/2015   Emphysema    Esophageal stricture 07/02/1998   EGD   GERD (gastroesophageal reflux disease)    History of gout    "last flareup was in 2007" (12/13/2014)   History of hiatal hernia    Hyperlipidemia    Hypertension    Ischemic cardiomyopathy 2006   Echo 2020: EF 30-35%, diffuse hypokinesis   PVC's (premature ventricular contractions)    Renal artery stenosis ([place])    a. noted on CT 2008.   Type II diabetes mellitus (HCC)    Diet control    Walking pneumonia 1990's   Wears dentures    full upper   Past Surgical History:  Procedure Laterality Date   CARDIAC CATHETERIZATION  "several"   CARDIAC CATHETERIZATION N/A 12/13/2014   Procedure: Left Heart Cath and Coronary Angiography;  Surgeon: Jettie Booze, MD;  Location: [facility];  Service: Cardiovascular;  Laterality: N/A;   CARDIAC CATHETERIZATION  12/13/2014   Procedure: Coronary Stent Intervention;  Surgeon: Jettie Booze, MD;  Location: [facility];  Service: Cardiovascular;;   CARDIAC CATHETERIZATION N/A 10/07/2015   Procedure: Left Heart Cath and Cors/Grafts Angiography;  Surgeon: Burnell Blanks, MD;  Location: [facility];  Service: Cardiovascular;  Laterality: N/A;   CARDIAC CATHETERIZATION N/A 10/07/2015   Procedure: Coronary  Stent Intervention;  Surgeon: Burnell Blanks, MD;  Location: [facility];  Service: Cardiovascular;  Laterality: N/A;   CATARACT EXTRACTION W/PHACO Left 10/21/2021   Procedure: CATARACT EXTRACTION PHACO AND INTRAOCULAR LENS PLACEMENT (IOC) LEFT 3.94 00:33.7;  Surgeon: Eulogio Bear, MD;  Location: [facility];  Service: Ophthalmology;  Laterality: Left;   CORONARY ANGIOPLASTY  "several"   CORONARY ANGIOPLASTY WITH STENT PLACEMENT  2005; 12/13/2014   "2; 1"   CORONARY ARTERY BYPASS GRAFT  1996   CABG X5   CORONARY ARTERY BYPASS GRAFT  March 2007   CABG X3   ESOPHAGOGASTRODUODENOSCOPY (EGD) WITH ESOPHAGEAL DILATION  2000   GREEN LIGHT LASER TURP (TRANSURETHRAL RESECTION OF PROSTATE  2000's   "not cancerous"  HERNIA REPAIR     LAPAROSCOPIC CHOLECYSTECTOMY     LEFT HEART CATH AND CORS/GRAFTS ANGIOGRAPHY N/A 07/28/2017   Procedure: LEFT HEART CATH AND CORS/GRAFTS ANGIOGRAPHY;  Surgeon: Troy Sine, MD;  Location: [facility];  Service: Cardiovascular;  Laterality: N/A;   LUNG SURGERY  1996   "S/P CABG, had to put staple in lung after it had collapsed"   UMBILICAL HERNIA REPAIR     w/chole   Family History  Problem Relation Age of Onset   Heart attack Mother        MI   Stroke Mother    Heart disease Mother    Hypertension Mother    Hyperlipidemia Mother    Heart disease Father    Rheumatic fever Father    Colon cancer Neg Hx    Social History   Socioeconomic History   Marital status: Married    Spouse name: Not on file   Number of children: 2   Years of education: Not on file   Highest education level: 8th grade  Occupational History   Occupation: Retired  Tobacco Use   Smoking status: Former    Packs/day: 3.00    Years: 20.00    Pack years: 60.00    Types: Cigarettes    Quit date: 07/25/1986    Years since quitting: 35.2   Smokeless tobacco: Never  Vaping Use   Vaping Use: Never used  Substance and Sexual Activity   Alcohol use:  No    Alcohol/week: 0.0 standard drinks   Drug use: No   Sexual activity: Not Currently  Other Topics Concern   Not on file  Social History Narrative   Did auto salvage work.   Lives at home with his wife.  Independent at baseline.   Social Determinants of Health   Financial Resource Strain: Low Risk    Difficulty of Paying Living Expenses: Not hard at all  Food Insecurity: No Food Insecurity   Worried About Charity fundraiser in the Last Year: Never true   [place] in the Last Year: Never true  Transportation Needs: No Transportation Needs   Lack of Transportation (Medical): No   Lack of Transportation (Non-Medical): No  Physical Activity: Not on file  Stress: No Stress Concern Present   Feeling of Stress : Not at all  Social Connections: Moderately Isolated   Frequency of Communication with Friends and Family: Twice a week   Frequency of Social Gatherings with Friends and Family: Twice a week   Attends Religious Services: Never   Printmaker: No   Attends Music therapist: Never   Marital Status: Married    Tobacco Counseling Counseling given: Not Answered   Clinical Intake:  Pre-visit preparation completed: Yes  Pain : No/denies pain     Nutritional Risks: None Diabetes: No  How often do you need to have someone help you when you read instructions, pamphlets, or other written materials from your doctor or pharmacy?: 1 - Never  Diabetic?no  Interpreter Needed?: No  Information entered by :: Kirke Shaggy, LPN   Activities of Daily Living    07/03/2021   10:59 AM 11/03/2020    2:48 PM  In your present state of health, do you have any difficulty performing the following activities:  Hearing? 1 1  Vision? 1 1  Difficulty concentrating or making decisions? 0 0  Walking or climbing stairs? 1 1  Dressing or bathing? 1 0  Doing errands, shopping? 0 0    Patient Care Team: [name]., MD as PCP -  General (Unknown Physician Specialty) Burnell Blanks, MD as PCP - Cardiology (Cardiology) Murlean Iba, MD as Consulting Physician (Internal Medicine) Eulogio Bear, MD as Consulting Physician (Ophthalmology) Margaretha Sheffield, MD (Otolaryngology) Germaine Pomfret, [facility] (Pharmacist)  Indicate any recent Medical Services you may have received from other than [facility] providers in the past year (date may be approximate).     Assessment:   This is a routine wellness examination for Ronald [name].  Hearing/Vision screen No results found.  Dietary issues and exercise activities discussed:     Goals Addressed             This Visit's Progress    DIET - EAT MORE FRUITS AND VEGETABLES        Depression Screen    10/28/2021   11:01 AM 11/03/2020    2:47 PM 04/28/2020   12:10 PM 03/19/2020    3:04 PM 04/19/2019    2:48 PM 04/17/2018    2:59 PM 01/23/2018    3:29 PM  PHQ 2/9 Scores  PHQ - 2 Score 0 0 0 0 0 0 0  PHQ- 9 Score  6  9       Fall Risk    10/28/2021   11:03 AM 07/03/2021   10:59 AM 11/03/2020    2:46 PM 04/28/2020   12:13 PM 03/19/2020    3:04 PM  Fall Risk   Falls in the past year? 1 1 0 0 0  Number falls in past yr: 1 1 0 0 0  Injury with Fall? 0 0 0 0 0  Risk for fall due to : History of fall(s)  Orthopedic patient    Follow up Falls prevention discussed  Falls evaluation completed  Falls evaluation completed    [place]:  Any stairs in or around the home? No  If so, are there any without handrails? No  Home free of loose throw rugs in walkways, pet beds, electrical cords, etc? Yes  Adequate lighting in your home to reduce risk of falls? Yes   ASSISTIVE DEVICES UTILIZED TO PREVENT FALLS:  Life alert? No  Use of a cane, walker or w/c? No  Grab bars in the bathroom? No  Shower chair or bench in shower? Yes  Elevated toilet seat or a handicapped toilet? No    Cognitive Function: pt unavailable        04/17/2018     3:05 PM 04/16/2016    3:06 PM  6CIT Screen  What Year? 0 points 0 points  What month? 0 points 0 points  What time? 0 points 0 points  Count back from 20 0 points 0 points  Months in reverse 4 points 4 points  Repeat phrase 2 points 10 points  Total Score 6 points 14 points    Immunizations Immunization History  Administered Date(s) Administered   Fluad Quad(high Dose 65+) 03/01/2019, 03/19/2020, 02/25/2021   Influenza, High Dose Seasonal PF 03/18/2015, 03/02/2016, 02/13/2018   Influenza-Unspecified 03/14/2017   Moderna Sars-Covid-2 Vaccination 07/28/2019, 08/25/2019   Pneumococcal Conjugate-13 12/25/2013   Pneumococcal Polysaccharide-23 03/04/2009   Td 10/10/2003, 04/16/2016    TDAP status: Up to date  Flu Vaccine status: Up to date  Pneumococcal vaccine status: Up to date  Covid-19 vaccine status: Completed vaccines  Qualifies for Shingles Vaccine? Yes   Zostavax completed No   Shingrix Completed?: No.  Education has been provided regarding the importance of this vaccine. Patient has been advised to call insurance company to determine out of pocket expense if they have not yet received this vaccine. Advised may also receive vaccine at local pharmacy or Health Dept. Verbalized acceptance and understanding.  Screening Tests Health Maintenance  Topic Date Due   Zoster Vaccines- Shingrix (1 of 2) Never done   COVID-19 Vaccine (3 - Moderna risk series) 09/22/2019   FOOT EXAM  11/13/2021 (Originally 01/11/2018)   INFLUENZA VACCINE  12/29/2021   HEMOGLOBIN A1C  01/24/2022   OPHTHALMOLOGY EXAM  03/03/2022   TETANUS/TDAP  04/16/2026   Pneumonia Vaccine 8+ Years old  Completed   Hepatitis C Screening  Completed   HPV VACCINES  Aged Out   COLONOSCOPY (Pts 45-4yr Insurance coverage will need to be confirmed)  Discontinued    Health Maintenance  Health Maintenance Due  Topic Date Due   Zoster Vaccines- Shingrix (1 of 2) Never done   COVID-19 Vaccine (3 - Moderna  risk series) 09/22/2019    Colorectal cancer screening: No longer required.   Lung Cancer Screening: (Low Dose CT Chest recommended if Age 312-80years, 30 pack-year currently smoking OR have quit w/in 15years.) does not qualify.    Additional Screening:  Hepatitis C Screening: does qualify; Completed 01/13/17  Vision Screening: Recommended annual ophthalmology exams for early detection of glaucoma and other disorders of the eye. Is the patient up to date with their annual eye exam?  Yes  Who is the provider or what is the name of the office in which the patient attends annual eye exams? [facility] pt is not established with a provider, would they like to be referred to a provider to establish care? No .   Dental Screening: Recommended annual dental exams for proper oral hygiene  Community Resource Referral / Chronic Care Management: CRR required this visit?  No   CCM required this visit?  No      Plan:     I have personally reviewed and noted the following in the patient's chart:   Medical and social history Use of alcohol, tobacco or illicit drugs  Current medications and supplements including opioid prescriptions. Patient is not currently taking opioid prescriptions. Functional ability and status Nutritional status Physical activity Advanced directives List of other physicians Hospitalizations, surgeries, and ER visits in previous 12 months Vitals Screenings to include cognitive, depression, and falls Referrals and appointments  In addition, I have reviewed and discussed with patient certain preventive protocols, quality metrics, and best practice recommendations. A written personalized care plan for preventive services as well as general preventive health recommendations were provided to patient.     LDionisio David LPN   [id number]  Nurse Notes: none

## 2021-11-03 ENCOUNTER — Other Ambulatory Visit: Payer: Self-pay | Admitting: Family Medicine

## 2021-11-05 NOTE — Discharge Instructions (Signed)

## 2021-11-09 ENCOUNTER — Encounter
Admission: RE | Disposition: A | Discharge status: Home / Self Care | Payer: Self-pay | Source: Home / Self Care | Attending: Ophthalmology

## 2021-11-09 ENCOUNTER — Ambulatory Visit
Admission: RE | Admit: 2021-11-09 | Discharge: 2021-11-09 | Disposition: A | Discharge status: Home / Self Care | Payer: Medicare Other | Attending: Ophthalmology | Admitting: Ophthalmology

## 2021-11-09 ENCOUNTER — Encounter: Payer: Self-pay | Admitting: Ophthalmology

## 2021-11-09 ENCOUNTER — Ambulatory Visit: Payer: Medicare Other | Admitting: Anesthesiology

## 2021-11-09 ENCOUNTER — Other Ambulatory Visit: Payer: Self-pay

## 2021-11-09 DIAGNOSIS — E1136 Type 2 diabetes mellitus with diabetic cataract: Secondary | ICD-10-CM | POA: Insufficient documentation

## 2021-11-09 DIAGNOSIS — I130 Hypertensive heart and chronic kidney disease with heart failure and stage 1 through stage 4 chronic kidney disease, or unspecified chronic kidney disease: Secondary | ICD-10-CM | POA: Diagnosis not present

## 2021-11-09 DIAGNOSIS — N184 Chronic kidney disease, stage 4 (severe): Secondary | ICD-10-CM | POA: Diagnosis not present

## 2021-11-09 DIAGNOSIS — Z87891 Personal history of nicotine dependence: Secondary | ICD-10-CM | POA: Insufficient documentation

## 2021-11-09 DIAGNOSIS — K219 Gastro-esophageal reflux disease without esophagitis: Secondary | ICD-10-CM | POA: Diagnosis not present

## 2021-11-09 DIAGNOSIS — H2511 Age-related nuclear cataract, right eye: Secondary | ICD-10-CM | POA: Diagnosis not present

## 2021-11-09 DIAGNOSIS — I5042 Chronic combined systolic (congestive) and diastolic (congestive) heart failure: Secondary | ICD-10-CM | POA: Insufficient documentation

## 2021-11-09 DIAGNOSIS — E1122 Type 2 diabetes mellitus with diabetic chronic kidney disease: Secondary | ICD-10-CM | POA: Insufficient documentation

## 2021-11-09 DIAGNOSIS — Z7901 Long term (current) use of anticoagulants: Secondary | ICD-10-CM | POA: Diagnosis not present

## 2021-11-09 DIAGNOSIS — Z951 Presence of aortocoronary bypass graft: Secondary | ICD-10-CM | POA: Diagnosis not present

## 2021-11-09 DIAGNOSIS — Z955 Presence of coronary angioplasty implant and graft: Secondary | ICD-10-CM | POA: Diagnosis not present

## 2021-11-09 DIAGNOSIS — H25811 Combined forms of age-related cataract, right eye: Secondary | ICD-10-CM | POA: Diagnosis not present

## 2021-11-09 DIAGNOSIS — I4891 Unspecified atrial fibrillation: Secondary | ICD-10-CM | POA: Diagnosis not present

## 2021-11-09 HISTORY — PX: CATARACT EXTRACTION W/PHACO: SHX586

## 2021-11-09 SURGERY — PHACOEMULSIFICATION, CATARACT, WITH IOL INSERTION
Anesthesia: Monitor Anesthesia Care | Site: Eye | Laterality: Right

## 2021-11-09 MED ORDER — MOXIFLOXACIN HCL 0.5 % OP SOLN
OPHTHALMIC | Status: DC | PRN
  Administered 2021-11-09: 0.2 mL via OPHTHALMIC

## 2021-11-09 MED ORDER — SIGHTPATH DOSE#1 BSS IO SOLN
INTRAOCULAR | Status: DC | PRN
  Administered 2021-11-09: 68 mL via OPHTHALMIC

## 2021-11-09 MED ORDER — TETRACAINE HCL 0.5 % OP SOLN
1.0000 [drp] | OPHTHALMIC | Status: DC | PRN
  Administered 2021-11-09 (×2): 1 [drp] via OPHTHALMIC

## 2021-11-09 MED ORDER — SIGHTPATH DOSE#1 SODIUM HYALURONATE 10 MG/ML IO SOLUTION
PREFILLED_SYRINGE | INTRAOCULAR | Status: DC | PRN
  Administered 2021-11-09: 0.85 mL via INTRAOCULAR

## 2021-11-09 MED ORDER — MIDAZOLAM HCL 2 MG/2ML IJ SOLN
INTRAMUSCULAR | Status: DC | PRN
  Administered 2021-11-09: 1 mg via INTRAVENOUS

## 2021-11-09 MED ORDER — ONDANSETRON HCL 4 MG/2ML IJ SOLN: 4.0000 mg | Freq: Once | INTRAMUSCULAR | Status: DC | PRN

## 2021-11-09 MED ORDER — SIGHTPATH DOSE#1 SODIUM HYALURONATE 23 MG/ML IO SOLUTION
PREFILLED_SYRINGE | INTRAOCULAR | Status: DC | PRN
  Administered 2021-11-09: 0.6 mL via INTRAOCULAR

## 2021-11-09 MED ORDER — LIDOCAINE HCL (PF) 2 % IJ SOLN
INTRAOCULAR | Status: DC | PRN
  Administered 2021-11-09: 1 mL via INTRAOCULAR

## 2021-11-09 MED ORDER — ARMC OPHTHALMIC DILATING DROPS
1.0000 "application " | OPHTHALMIC | Status: DC | PRN
  Administered 2021-11-09 (×3): 1 via OPHTHALMIC

## 2021-11-09 MED ORDER — SIGHTPATH DOSE#1 BSS IO SOLN
INTRAOCULAR | Status: DC | PRN
  Administered 2021-11-09: 15 mL

## 2021-11-09 MED ORDER — FENTANYL CITRATE (PF) 100 MCG/2ML IJ SOLN
INTRAMUSCULAR | Status: DC | PRN
  Administered 2021-11-09: 50 ug via INTRAVENOUS

## 2021-11-09 SURGICAL SUPPLY — 20 items
CANNULA ANT/CHMB 27G (MISCELLANEOUS) IMPLANT
CANNULA ANT/CHMB 27GA (MISCELLANEOUS) IMPLANT
CATARACT SUITE SIGHTPATH (MISCELLANEOUS) ×2 IMPLANT
DISSECTOR HYDRO NUCLEUS 50X22 (MISCELLANEOUS) ×2 IMPLANT
FEE CATARACT SUITE SIGHTPATH (MISCELLANEOUS) ×1 IMPLANT
GLOVE SURG GAMMEX PI TX LF 7.5 (GLOVE) ×2 IMPLANT
GLOVE SURG SYN 8.5  E (GLOVE) ×2
GLOVE SURG SYN 8.5 E (GLOVE) ×1 IMPLANT
GLOVE SURG SYN 8.5 PF PI (GLOVE) ×1 IMPLANT
LENS IOL TECNIS EYHANCE 21.5 (Intraocular Lens) ×1 IMPLANT
NDL FILTER BLUNT 18X1 1/2 (NEEDLE) ×1 IMPLANT
NEEDLE FILTER BLUNT 18X 1/2SAF (NEEDLE) ×1
NEEDLE FILTER BLUNT 18X1 1/2 (NEEDLE) ×1 IMPLANT
PACK VIT ANT 23G (MISCELLANEOUS) IMPLANT
RING MALYGIN (MISCELLANEOUS) IMPLANT
SUT ETHILON 10-0 CS-B-6CS-B-6 (SUTURE)
SUTURE EHLN 10-0 CS-B-6CS-B-6 (SUTURE) IMPLANT
SYR 3ML LL SCALE MARK (SYRINGE) ×2 IMPLANT
SYR 5ML LL (SYRINGE) ×2 IMPLANT
WATER STERILE IRR 250ML POUR (IV SOLUTION) ×2 IMPLANT

## 2021-11-09 NOTE — Transfer of Care (Signed)
Immediate Anesthesia Transfer of Care Note  Patient: [name]  Procedure(s) Performed: CATARACT EXTRACTION PHACO AND INTRAOCULAR LENS PLACEMENT (IOC) RIGHT (Right: Eye)  Patient Location: PACU  Anesthesia Type: MAC  Level of Consciousness: awake, alert  and patient cooperative  Airway and Oxygen Therapy: Patient Spontanous Breathing and Patient connected to supplemental oxygen  Post-op Assessment: Post-op Vital signs reviewed, Patient's Cardiovascular Status Stable, Respiratory Function Stable, Patent Airway and No signs of Nausea or vomiting  Post-op Vital Signs: Reviewed and stable  Complications: No notable events documented.

## 2021-11-09 NOTE — Op Note (Signed)
OPERATIVE NOTE  Abhay [facility] [id number] 11/09/2021   PREOPERATIVE DIAGNOSIS:  Nuclear sclerotic cataract right eye.  H25.11   POSTOPERATIVE DIAGNOSIS:    Nuclear sclerotic cataract right eye.     PROCEDURE:  Phacoemusification with posterior chamber intraocular lens placement of the right eye   LENS:   Implant Name Type Inv. Item Serial No. Manufacturer Lot No. LRB No. Used Action  LENS IOL TECNIS EYHANCE 21.5 - [id number] Intraocular Lens LENS IOL TECNIS EYHANCE 21.5 [id number] [employer]  Right 1 Implanted       Procedure(s) with comments: CATARACT EXTRACTION PHACO AND INTRAOCULAR LENS PLACEMENT (IOC) RIGHT (Right) - 3.59 0:29.4  DIB00 +21.5   ULTRASOUND TIME: 0 minutes 29 seconds.  CDE 3.59   SURGEON:  Benay Pillow, MD, MPH  ANESTHESIOLOGIST: Anesthesiologist: Veda Canning, MD CRNA: Jeannene Patella, CRNA   ANESTHESIA:  Topical with tetracaine drops augmented with 1% preservative-free intracameral lidocaine.  ESTIMATED BLOOD LOSS: less than 1 mL.   COMPLICATIONS:  None.   DESCRIPTION OF PROCEDURE:  The patient was identified in the holding room and transported to the operating room and placed in the supine position under the operating microscope.  The right eye was identified as the operative eye and it was prepped and draped in the usual sterile ophthalmic fashion.   A 1.0 millimeter clear-corneal paracentesis was made at the 10:30 position. 0.5 ml of preservative-free 1% lidocaine with epinephrine was injected into the anterior chamber.  The anterior chamber was filled with Healon 5 viscoelastic.  A 2.4 millimeter keratome was used to make a near-clear corneal incision at the 8:00 position.  A curvilinear capsulorrhexis was made with a cystotome and capsulorrhexis forceps.  Balanced salt solution was used to hydrodissect and hydrodelineate the nucleus.   Phacoemulsification was then used in stop and chop fashion to remove the lens nucleus and epinucleus.  The  remaining cortex was then removed using the irrigation and aspiration handpiece. Healon was then placed into the capsular bag to distend it for lens placement.  A lens was then injected into the capsular bag.  The remaining viscoelastic was aspirated.   Wounds were hydrated with balanced salt solution.  The anterior chamber was inflated to a physiologic pressure with balanced salt solution.   Intracameral vigamox 0.1 mL undiluted was injected into the eye and a drop placed onto the ocular surface.  No wound leaks were noted.  The patient was taken to the recovery room in stable condition without complications of anesthesia or surgery  Benay Pillow 11/09/2021, 9:33 AM

## 2021-11-09 NOTE — Anesthesia Postprocedure Evaluation (Signed)
Anesthesia Post Note  Patient: [name]  Procedure(s) Performed: CATARACT EXTRACTION PHACO AND INTRAOCULAR LENS PLACEMENT (IOC) RIGHT (Right: Eye)     Patient location during evaluation: PACU Anesthesia Type: MAC Level of consciousness: awake Pain management: pain level controlled Vital Signs Assessment: post-procedure vital signs reviewed and stable Respiratory status: respiratory function stable Cardiovascular status: stable Postop Assessment: no apparent nausea or vomiting Anesthetic complications: no   No notable events documented.  Veda Canning

## 2021-11-09 NOTE — Anesthesia Preprocedure Evaluation (Signed)
Anesthesia Evaluation  Patient identified by MRN, date of birth, ID band Patient awake    History of Anesthesia Complications Negative for: history of anesthetic complications  Airway Mallampati: II  TM Distance: >3 FB Neck ROM: Full    Dental  (+) Upper Dentures,    Pulmonary sleep apnea , COPD, former smoker,    Pulmonary exam normal        Cardiovascular hypertension, Pt. on medications + Cardiac Stents (stents in 2016 and 2017), + CABG (CABG 1996 with redo 2007) and +CHF  Normal cardiovascular exam+ dysrhythmias (developed in 2020, on Eliquis) Atrial Fibrillation   2020 Echo  Study Conclusions   - Left ventricle: The cavity size was moderately dilated. Systolic  function was moderately to severely reduced. The estimated  ejection fraction was in the range of 30% to 35%. Diffuse  hypokinesis. The study is not technically sufficient to allow  evaluation of LV diastolic function.  - Mitral valve: There was mild regurgitation.  - Left atrium: The atrium was mildly dilated.  - Right ventricle: The cavity size was mildly dilated. Wall  thickness was normal. Systolic function was mildly reduced.   Neuro/Psych negative neurological ROS     GI/Hepatic GERD  Medicated and Controlled,  Endo/Other  diabetesHypothyroidism   Renal/GU CRF and Renal InsufficiencyRenal disease (renal artery stenosis, stage 4 CKD)     Musculoskeletal negative musculoskeletal ROS (+)   Abdominal   Peds  Hematology negative hematology ROS (+)   Anesthesia Other Findings   Reproductive/Obstetrics negative OB ROS                            Anesthesia Physical Anesthesia Plan  ASA: 3  Anesthesia Plan: MAC   Post-op Pain Management: Minimal or no pain anticipated   Induction:   PONV Risk Score and Plan: 1 and Treatment may vary due to age or medical condition, TIVA and Midazolam  Airway Management  Planned: Nasal Cannula and Natural Airway  Additional Equipment: None  Intra-op Plan:   Post-operative Plan:   Informed Consent: I have reviewed the patients History and Physical, chart, labs and discussed the procedure including the risks, benefits and alternatives for the proposed anesthesia with the patient or authorized representative who has indicated his/her understanding and acceptance.       Plan Discussed with: CRNA  Anesthesia Plan Comments: (Patient and wife report that patient took a nitro tablet this past weekend, which is out of the ordinary for him. However they note that he ate a large chili dog with raw onions that evening, and think that likely this is what had triggered the discomfort that caused him to take a nitroclycerin. He has felt like his usual self since the weekend and has been active and without chest pain or discomfort. In the past when he is having cardiac issues he is not able to function at his baseline. Both he and his wife feel that whatever his discomfort was from this weekend was likely not cardiac. I have a very long discussion with the patient and his wife about cardiac risk with surgery, and that if this was any surgery or procedure besides a cataract removal I would want him to see his cardiologist prior to the procedure. However, as he has not needed to stop his Eliquis, and he feels confident that his symptoms are not cardiac (and he is well versed in cardiac symptoms), I and the patient and his wife felt comfortable   moving forward with his surgery today. His wife will call his cardiologist this week. )       Anesthesia Quick Evaluation  

## 2021-11-09 NOTE — H&P (Signed)
[facility]   Primary Care Physician:  [name]., MD Ophthalmologist: Dr. Benay Pillow  Pre-Procedure History & Physical: HPI:  Ronald [name] is a 76 y.o. male here for cataract surgery.   Past Medical History:  Diagnosis Date   AAA (abdominal aortic aneurysm) ([place])    a. 3cm by [place] 2015.   Arthritis    "hips; back" (12/13/2014)   CAD (coronary artery disease) 2007   a. s/p CABG- IMA-LAD, VG-Cx, VG-RCA, VG-diag in 1999. B. sp redo CABG- VG-OM, VG-RCA in 2007 due to VG disease. c. NSTEMI 11/2014 s/p DES to SVG-OM from the Y graft.d. PTCA/DES x 1 distal body of SVG to Diagonal.09/2015   Chronic combined systolic and diastolic CHF (congestive heart failure) ([place])    a. remote EF 40-45% in 2006. b. Normal EF 2014. b. Echo 07/2016 EF 45-50%, grade 1 DD. c. Echo 2020 30% to 35%. Diffuse EF 30-35%, diffuse hypokinesis.   Chronic lower back pain    CKD (chronic kidney disease), stage IV (HCC)    COPD (chronic obstructive pulmonary disease) (HCC)    Deafness in left ear    Degenerative disc disease, lumbar    Dilated cardiomyopathy ([place]) 10/07/2015   Emphysema    Esophageal stricture 07/02/1998   EGD   GERD (gastroesophageal reflux disease)    History of gout    "last flareup was in 2007" (12/13/2014)   History of hiatal hernia    Hyperlipidemia    Hypertension    Ischemic cardiomyopathy 2006   Echo 2020: EF 30-35%, diffuse hypokinesis   PVC's (premature ventricular contractions)    Renal artery stenosis ([place])    a. noted on CT 2008.   Type II diabetes mellitus (HCC)    Diet control    Walking pneumonia 1990's   Wears dentures    full upper    Past Surgical History:  Procedure Laterality Date   CARDIAC CATHETERIZATION  "several"   CARDIAC CATHETERIZATION N/A 12/13/2014   Procedure: Left Heart Cath and Coronary Angiography;  Surgeon: Jettie Booze, MD;  Location: [place] [facility];  Service: Cardiovascular;  Laterality: N/A;   CARDIAC CATHETERIZATION   12/13/2014   Procedure: Coronary Stent Intervention;  Surgeon: Jettie Booze, MD;  Location: [place] [facility];  Service: Cardiovascular;;   CARDIAC CATHETERIZATION N/A 10/07/2015   Procedure: Left Heart Cath and Cors/Grafts Angiography;  Surgeon: Burnell Blanks, MD;  Location: [place] [facility];  Service: Cardiovascular;  Laterality: N/A;   CARDIAC CATHETERIZATION N/A 10/07/2015   Procedure: Coronary Stent Intervention;  Surgeon: Burnell Blanks, MD;  Location: [place] [facility];  Service: Cardiovascular;  Laterality: N/A;   CATARACT EXTRACTION W/PHACO Left 10/21/2021   Procedure: CATARACT EXTRACTION PHACO AND INTRAOCULAR LENS PLACEMENT (IOC) LEFT 3.94 00:33.7;  Surgeon: Eulogio Bear, MD;  Location: [place];  Service: Ophthalmology;  Laterality: Left;   CORONARY ANGIOPLASTY  "several"   CORONARY ANGIOPLASTY WITH STENT PLACEMENT  2005; 12/13/2014   "2; 1"   CORONARY ARTERY BYPASS GRAFT  1996   CABG X5   CORONARY ARTERY BYPASS GRAFT  March 2007   CABG X3   ESOPHAGOGASTRODUODENOSCOPY (EGD) WITH ESOPHAGEAL DILATION  2000   GREEN LIGHT LASER TURP (TRANSURETHRAL RESECTION OF PROSTATE  2000's   "not cancerous"   HERNIA REPAIR     LAPAROSCOPIC CHOLECYSTECTOMY     LEFT HEART CATH AND CORS/GRAFTS ANGIOGRAPHY N/A 07/28/2017   Procedure: LEFT HEART CATH AND CORS/GRAFTS ANGIOGRAPHY;  Surgeon: Claiborne Billings,  Joyice Faster, MD;  Location: [place] [facility];  Service: Cardiovascular;  Laterality: N/A;   LUNG SURGERY  1996   "S/P CABG, had to put staple in lung after it had collapsed"   UMBILICAL HERNIA REPAIR     w/chole    Prior to Admission medications   Medication Sig Start Date End Date Taking? Authorizing Provider  ACETAMINOPHEN PO Take 650 mg by mouth. 2 at bedtime   Yes [provider]  allopurinol (ZYLOPRIM) 100 MG tablet TAKE 1 TABLET BY MOUTH EVERYDAY AT BEDTIME 07/13/21  Yes [name]., MD  amiodarone (PACERONE) 200 MG tablet TAKE 1 TABLET BY  MOUTH  DAILY 06/22/21  Yes Burnell Blanks, MD  amLODipine (NORVASC) 10 MG tablet TAKE 1 TABLET BY MOUTH EVERY DAY 05/05/21  Yes Rumball, Jake Church, DO  apixaban (ELIQUIS) 5 MG TABS tablet Take 1 tablet (5 mg total) by mouth 2 (two) times daily. 09/09/21  Yes Burnell Blanks, MD  atorvastatin (LIPITOR) 40 MG tablet TAKE 1 TABLET BY MOUTH EVERY DAY 05/27/21  Yes [name]., MD  furosemide (LASIX) 40 MG tablet Take 40 mg by mouth daily as needed for fluid or edema.  05/05/17  Yes [provider]  irbesartan (AVAPRO) 150 MG tablet Take 1 tablet (150 mg total) by mouth daily. 08/07/21  Yes Tanda Rockers, MD  isosorbide mononitrate (IMDUR) 30 MG 24 hr tablet TAKE 1 TABLET BY MOUTH  DAILY 12/26/20  Yes Burnell Blanks, MD  levothyroxine (SYNTHROID) 75 MCG tablet TAKE 1 TABLET BY MOUTH DAILY BEFORE BREAKFAST. 06/30/21  Yes [name]., MD  loratadine (CLARITIN) 10 MG tablet Take 10 mg by mouth daily as needed for allergies.   Yes [provider]  magnesium oxide (MAG-OX) 400 (240 Mg) MG tablet TAKE 1 TABLET BY MOUTH TWICE A DAY 11/03/21  Yes [name]., MD  metoprolol tartrate (LOPRESSOR) 100 MG tablet TAKE 1 TABLET BY MOUTH  TWICE DAILY 06/22/21  Yes Burnell Blanks, MD  Multiple Vitamin (MULTIVITAMIN PO) Take 1 tablet by mouth daily.   Yes [provider]  pantoprazole (PROTONIX) 40 MG tablet TAKE 2 TABLETS BY MOUTH DAILY 09/11/21  Yes [name]., MD  polyethylene glycol (MIRALAX / GLYCOLAX) 17 g packet Take 17 g by mouth daily.   Yes [provider]  sodium bicarbonate 650 MG tablet Take 650 mg by mouth 2 (two) times daily. Evening   Yes [provider]  Cholecalciferol (VITAMIN D-3) 125 MCG (5000 UT) TABS Take 2,000 Units by mouth daily.     [provider]  nitroGLYCERIN (NITROSTAT) 0.4 MG SL tablet Place 0.4 mg under the tongue every 5 (five) minutes as needed for chest pain (Up  to 3 times).     [provider]    Allergies as of 10/23/2021 - Review Complete 10/21/2021  Allergen Reaction Noted   Predicort [prednisolone] Other (See Comments) 07/07/2017   Ciprofloxacin  04/18/2014   Hydrochlorothiazide Other (See Comments) 08/15/2008   Hydrocodone Nausea Only 08/15/2008   Hydrocodone-acetaminophen Nausea Only 08/15/2008   Loratadine Other (See Comments) 08/09/2016   Sulfa antibiotics Other (See Comments) 10/04/2014   Sulfacetamide sodium Other (See Comments) 12/03/2014   Sulfasalazine Other (See Comments) 10/04/2014   Penicillins Hives and Rash 08/15/2008    Family History  Problem Relation Age of Onset   Heart attack Mother        MI   Stroke Mother  Heart disease Mother    Hypertension Mother    Hyperlipidemia Mother    Heart disease Father    Rheumatic fever Father    Colon cancer Neg Hx     Social History   Socioeconomic History   Marital status: Married    Spouse name: Not on file   Number of children: 2   Years of education: Not on file   Highest education level: 8th grade  Occupational History   Occupation: Retired  Tobacco Use   Smoking status: Former    Packs/day: 3.00    Years: 20.00    Total pack years: 60.00    Types: Cigarettes    Quit date: 07/25/1986    Years since quitting: 35.3   Smokeless tobacco: Never  Vaping Use   Vaping Use: Never used  Substance and Sexual Activity   Alcohol use: No    Alcohol/week: 0.0 standard drinks of alcohol   Drug use: No   Sexual activity: Not Currently  Other Topics Concern   Not on file  Social History Narrative   Did auto salvage work.   Lives at home with his wife.  Independent at baseline.   Social Determinants of Health   Financial Resource Strain: Low Risk  (10/28/2021)   Overall Financial Resource Strain (CARDIA)    Difficulty of Paying Living Expenses: Not hard at all  Food Insecurity: No Food Insecurity (10/28/2021)   Hunger Vital Sign    Worried About Running  Out of Food in the Last Year: Never true    Ran Out of Food in the Last Year: Never true  Transportation Needs: No Transportation Needs (10/28/2021)   PRAPARE - Hydrologist (Medical): No    Lack of Transportation (Non-Medical): No  Physical Activity: Inactive (04/28/2020)   Exercise Vital Sign    Days of Exercise per Week: 0 days    Minutes of Exercise per Session: 0 min  Stress: No Stress Concern Present (10/28/2021)   [place]    Feeling of Stress : Not at all  Social Connections: Moderately Isolated (10/28/2021)   Social Connection and Isolation Panel [NHANES]    Frequency of Communication with Friends and Family: Twice a week    Frequency of Social Gatherings with Friends and Family: Twice a week    Attends Religious Services: Never    Marine scientist or Organizations: No    Attends Archivist Meetings: Never    Marital Status: Married  Human resources officer Violence: Not At Risk (10/28/2021)   Humiliation, Afraid, Rape, and Kick questionnaire    Fear of Current or Ex-Partner: No    Emotionally Abused: No    Physically Abused: No    Sexually Abused: No    Review of Systems: See HPI, otherwise negative ROS  Physical Exam: BP (!) 182/64   Pulse (!) 56   Temp 98.1 F (36.7 C) (Temporal)   Resp 18   Ht '5\' 9"'$  (1.753 m)   Wt 107 kg   SpO2 98%   BMI 34.85 kg/m  General:   Alert, cooperative in NAD Head:  Normocephalic and atraumatic. Respiratory:  Normal work of breathing. Cardiovascular:  RRR  Impression/Plan: [facility] is here for cataract surgery.  Risks, benefits, limitations, and alternatives regarding cataract surgery have been reviewed with the patient.  Questions have been answered.  All parties agreeable.   Benay Pillow, MD  11/09/2021, 9:07 AM

## 2021-11-09 NOTE — Anesthesia Procedure Notes (Signed)
Procedure Name: MAC Date/Time: 11/09/2021 9:19 AM  Performed by: Jeannene Patella, CRNAPre-anesthesia Checklist: Patient identified, Emergency Drugs available, Suction available, Timeout performed and Patient being monitored Patient Re-evaluated:Patient Re-evaluated prior to induction Oxygen Delivery Method: Nasal cannula Placement Confirmation: positive ETCO2

## 2021-11-10 ENCOUNTER — Encounter: Payer: Self-pay | Admitting: Ophthalmology

## 2021-11-18 ENCOUNTER — Telehealth: Payer: Self-pay

## 2021-11-18 NOTE — Telephone Encounter (Signed)
PATIENT   ASSISTANCE FOR ELIQUIS WAS APPROVED ON 11/16/21 TO 05/30/22. APPLICATION # [id number] PHONE#[phone]

## 2021-11-24 ENCOUNTER — Ambulatory Visit (INDEPENDENT_AMBULATORY_CARE_PROVIDER_SITE_OTHER): Payer: Medicare Other | Admitting: Family Medicine

## 2021-11-24 ENCOUNTER — Encounter: Payer: Self-pay | Admitting: Family Medicine

## 2021-11-24 VITALS — BP 145/62 | HR 69 | Resp 16 | Wt 235.0 lb

## 2021-11-24 DIAGNOSIS — I25110 Atherosclerotic heart disease of native coronary artery with unstable angina pectoris: Secondary | ICD-10-CM | POA: Diagnosis not present

## 2021-11-24 DIAGNOSIS — M1610 Unilateral primary osteoarthritis, unspecified hip: Secondary | ICD-10-CM | POA: Diagnosis not present

## 2021-11-24 DIAGNOSIS — I420 Dilated cardiomyopathy: Secondary | ICD-10-CM | POA: Diagnosis not present

## 2021-11-24 DIAGNOSIS — E119 Type 2 diabetes mellitus without complications: Secondary | ICD-10-CM | POA: Diagnosis not present

## 2021-11-24 DIAGNOSIS — I10 Essential (primary) hypertension: Secondary | ICD-10-CM

## 2021-11-24 DIAGNOSIS — E7800 Pure hypercholesterolemia, unspecified: Secondary | ICD-10-CM

## 2021-11-24 DIAGNOSIS — I480 Paroxysmal atrial fibrillation: Secondary | ICD-10-CM | POA: Diagnosis not present

## 2021-11-24 DIAGNOSIS — R058 Other specified cough: Secondary | ICD-10-CM

## 2021-11-24 DIAGNOSIS — E1121 Type 2 diabetes mellitus with diabetic nephropathy: Secondary | ICD-10-CM

## 2021-11-24 DIAGNOSIS — I714 Abdominal aortic aneurysm, without rupture, unspecified: Secondary | ICD-10-CM

## 2021-11-24 DIAGNOSIS — N184 Chronic kidney disease, stage 4 (severe): Secondary | ICD-10-CM | POA: Diagnosis not present

## 2021-11-24 MED ORDER — GABAPENTIN 100 MG PO CAPS: 100.0000 mg | ORAL_CAPSULE | Freq: Three times a day (TID) | ORAL | 3 refills | Status: DC

## 2021-11-24 NOTE — Progress Notes (Signed)
Established patient visit  I,[name],acting as a scribe for [name], MD.,have documented all relevant documentation on the behalf of [name], MD,as directed by  [name], MD while in the presence of [name], MD.   Patient: Ronald [name] [facility]   DOB: [birth year]   76 y.o. Male  MRN: [id number] Visit Date: 11/24/2021  Today's healthcare provider: [name], MD   Chief Complaint  Patient presents with   Follow-up   Diabetes   Hyperlipidemia   Subjective    HPI  Patient comes in today for routine follow-up and has multiple issues that he is concerned about. He is on the verge of needing hemodialysis but is worried about his neuropathy which causes him discomfort all the time but is starting to interrupt his sleep at night.  He says that his right leg feels like it is on fire at night.   He also has some chronic sinus symptoms and rhinitis.  In the past few days he has had yellow BMs. Another "his throat gurgles "at night.  No shortness of breath PND or orthopnea. He complains of his back and wonders if there is anything that can be done about it.   The last thing is he wonders if he should have an endoscopy and colonoscopy again.  He did have polyps in 2018 but that were adenomatous but cardiology says he should not undergo anesthesia. Diabetes Mellitus Type II, follow-up  Lab Results  Component Value Date   HGBA1C 5.7 (A) 07/27/2021   HGBA1C 6.0 (H) 01/22/2021   HGBA1C 5.8 (A) 11/03/2020   Last seen for diabetes 4 months ago.  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  GFR in the teens.  Home blood sugar records: fasting range: not checking  Most Recent Eye Exam: 10/05/2021  --------------------------------------------------------------------------------------------------- Hypertension, follow-up  BP Readings from Last 3 Encounters:  11/24/21 (!) 145/62  11/09/21 (!)  156/68  10/21/21 (!) 166/65   Wt Readings from Last 3 Encounters:  11/24/21 235 lb (106.6 kg)  11/09/21 236 lb (107 kg)  10/28/21 233 lb (105.7 kg)     He was last seen for hypertension 4 months ago.  Management since that visit includes; taking amlodipine 10 mg, irbesartan 150 mg, and metoprolol 100 mg. Outside blood pressures are not checking.  --------------------------------------------------------------------------------------------------- Lipid/Cholesterol, follow-up  Last Lipid Panel: Lab Results  Component Value Date   CHOL 147 03/20/2020   LDLCALC 80 03/20/2020   HDL 52 03/20/2020   TRIG 80 03/20/2020    He was last seen for this 4 months ago.  Management since that visit includes; on atorvastatin 40 mg.  Last metabolic panel Lab Results  Component Value Date   GLUCOSE 139 (H) 08/12/2021   NA 139 08/12/2021   K 4.8 08/12/2021   BUN 38 (H) 08/12/2021   CREATININE 2.93 (H) 08/12/2021   EGFR 19 (L) 01/22/2021   GFRNONAA 21 (L) 08/12/2021   CALCIUM 9.1 08/12/2021   AST 20 01/22/2021   ALT 17 01/22/2021   The ASCVD Risk score (Arnett DK, et al., 2019) failed to calculate for the following reasons:   The patient has a prior MI or stroke diagnosis  ---------------------------------------------------------------------------------------------------   Medications: Outpatient Medications Prior to Visit  Medication Sig   ACETAMINOPHEN PO Take 650 mg by mouth. 2 at bedtime   allopurinol (ZYLOPRIM) 100 MG tablet TAKE 1 TABLET BY MOUTH EVERYDAY AT BEDTIME   amiodarone (  PACERONE) 200 MG tablet TAKE 1 TABLET BY MOUTH  DAILY   amLODipine (NORVASC) 10 MG tablet TAKE 1 TABLET BY MOUTH EVERY DAY   apixaban (ELIQUIS) 5 MG TABS tablet Take 1 tablet (5 mg total) by mouth 2 (two) times daily.   atorvastatin (LIPITOR) 40 MG tablet TAKE 1 TABLET BY MOUTH EVERY DAY   Cholecalciferol (VITAMIN D-3) 125 MCG (5000 UT) TABS Take 2,000 Units by mouth daily.    furosemide (LASIX) 40 MG  tablet Take 40 mg by mouth daily as needed for fluid or edema.    irbesartan (AVAPRO) 150 MG tablet Take 1 tablet (150 mg total) by mouth daily.   isosorbide mononitrate (IMDUR) 30 MG 24 hr tablet TAKE 1 TABLET BY MOUTH  DAILY   levothyroxine (SYNTHROID) 75 MCG tablet TAKE 1 TABLET BY MOUTH DAILY BEFORE BREAKFAST.   loratadine (CLARITIN) 10 MG tablet Take 10 mg by mouth daily as needed for allergies.   magnesium oxide (MAG-OX) 400 (240 Mg) MG tablet TAKE 1 TABLET BY MOUTH TWICE A DAY   metoprolol tartrate (LOPRESSOR) 100 MG tablet TAKE 1 TABLET BY MOUTH  TWICE DAILY   Multiple Vitamin (MULTIVITAMIN PO) Take 1 tablet by mouth daily.   nitroGLYCERIN (NITROSTAT) 0.4 MG SL tablet Place 0.4 mg under the tongue every 5 (five) minutes as needed for chest pain (Up to 3 times).    pantoprazole (PROTONIX) 40 MG tablet TAKE 2 TABLETS BY MOUTH DAILY   polyethylene glycol (MIRALAX / GLYCOLAX) 17 g packet Take 17 g by mouth daily.   sodium bicarbonate 650 MG tablet Take 650 mg by mouth 2 (two) times daily. Evening   No facility-administered medications prior to visit.    Review of Systems  Constitutional:  Negative for appetite change, chills and fever.  Respiratory:  Negative for chest tightness, shortness of breath and wheezing.   Cardiovascular:  Negative for chest pain and palpitations.  Gastrointestinal:  Negative for abdominal pain, nausea and vomiting.    Last hemoglobin A1c Lab Results  Component Value Date   HGBA1C 5.7 (H) 11/24/2021       Objective    BP (!) 145/62 (BP Location: Right Arm, Patient Position: Sitting, Cuff Size: Large)   Pulse 69   Resp 16   Wt 235 lb (106.6 kg)   SpO2 97%   BMI 34.70 kg/m  BP Readings from Last 3 Encounters:  11/24/21 (!) 145/62  11/09/21 (!) 156/68  10/21/21 (!) 166/65   Wt Readings from Last 3 Encounters:  11/24/21 235 lb (106.6 kg)  11/09/21 236 lb (107 kg)  10/28/21 233 lb (105.7 kg)      Physical Exam Vitals reviewed.   Constitutional:      Appearance: Normal appearance.     Comments: Truncal obesity  HENT:     Head: Normocephalic and atraumatic.     Right Ear: External ear normal.     Left Ear: External ear normal.  Eyes:     General: No scleral icterus.    Conjunctiva/sclera: Conjunctivae normal.  Cardiovascular:     Rate and Rhythm: Normal rate and regular rhythm.     Pulses: Normal pulses.     Heart sounds: Normal heart sounds.  Pulmonary:     Effort: Pulmonary effort is normal.     Breath sounds: Normal breath sounds.  Abdominal:     Palpations: Abdomen is soft.  Musculoskeletal:     Right lower leg: No edema.     Left lower leg: No edema.  Skin:    General: Skin is warm and dry.  Neurological:     General: No focal deficit present.     Mental Status: He is alert and oriented to person, place, and time.  Psychiatric:        Mood and Affect: Mood normal.        Behavior: Behavior normal.        Thought Content: Thought content normal.        Judgment: Judgment normal.       No results found for any visits on 11/24/21.  Assessment & Plan     1. Diabetes mellitus with nephropathy (HCC) A1c has been under excellent control.  Last was 5.7. - Hemoglobin A1c - Comprehensive Metabolic Panel (CMET)  2. Pure hypercholesterolemia On statin - Lipid panel - Comprehensive Metabolic Panel (CMET)  3. Primary hypertension Continue home monitoring of blood pressure - Comprehensive Metabolic Panel (CMET)  4. Coronary artery disease involving native coronary artery of native heart with unstable angina pectoris ([place]) Status post CABG x2 Overall I think patient is a poor candidate for any invasive testing other than what is absolutely needed   5. Dilated cardiomyopathy ([place]) Last EF 35%  6. Paroxysmal atrial fibrillation (HCC) On Eliquis  7. Abdominal aortic aneurysm (AAA) without rupture, unspecified part ([place]) 3.2 cm last checked in early 2022  8. Primary osteoarthritis of hip,  unspecified laterality   9. Upper airway cough syndrome Recently evaluated by Dr. Melvyn Novas with no changes in care  10. CKD (chronic kidney disease), stage IV ([place]) Followed by nephrology, possible hemodialysis in the near future     Return in about 4 months (around 03/26/2022).      I, [name], MD, have reviewed all documentation for this visit. The documentation on 11/25/21 for the exam, diagnosis, procedures, and orders are all accurate and complete.    Dulce Martian Cranford Mon, MD  [facility] [phone] (phone) [phone] (fax)  [place]

## 2021-11-25 LAB — LIPID PANEL
Chol/HDL Ratio: 2.6 ratio (ref 0.0–5.0)
Cholesterol, Total: 153 mg/dL (ref 100–199)
HDL: 60 mg/dL (ref >39)
LDL Chol Calc (NIH): 82 mg/dL (ref 0–99)
Triglycerides: 54 mg/dL (ref 0–149)
VLDL Cholesterol Cal: 11 mg/dL (ref 5–40)

## 2021-11-25 LAB — COMPREHENSIVE METABOLIC PANEL
ALT: 28 IU/L (ref 0–44)
AST: 26 IU/L (ref 0–40)
Albumin/Globulin Ratio: 2.1 (ref 1.2–2.2)
Albumin: 4.4 g/dL (ref 3.7–4.7)
Alkaline Phosphatase: 155 IU/L — ABNORMAL HIGH (ref 44–121)
BUN/Creatinine Ratio: 16 (ref 10–24)
BUN: 58 mg/dL — ABNORMAL HIGH (ref 8–27)
Bilirubin Total: 0.9 mg/dL (ref 0.0–1.2)
CO2: 22 mmol/L (ref 20–29)
Calcium: 9.7 mg/dL (ref 8.6–10.2)
Chloride: 103 mmol/L (ref 96–106)
Creatinine, Ser: 3.66 mg/dL — ABNORMAL HIGH (ref 0.76–1.27)
Globulin, Total: 2.1 g/dL (ref 1.5–4.5)
Glucose: 84 mg/dL (ref 70–99)
Potassium: 5.5 mmol/L — ABNORMAL HIGH (ref 3.5–5.2)
Sodium: 141 mmol/L (ref 134–144)
Total Protein: 6.5 g/dL (ref 6.0–8.5)
eGFR: 16 mL/min/{1.73_m2} — ABNORMAL LOW (ref >59)

## 2021-11-25 LAB — HEMOGLOBIN A1C
Est. average glucose Bld gHb Est-mCnc: 117 mg/dL
Hgb A1c MFr Bld: 5.7 % — ABNORMAL HIGH (ref 4.8–5.6)

## 2021-12-01 ENCOUNTER — Other Ambulatory Visit: Payer: Self-pay | Admitting: Cardiovascular Disease

## 2021-12-04 ENCOUNTER — Telehealth: Payer: Self-pay

## 2021-12-04 NOTE — Progress Notes (Signed)
    Chronic Care Management Pharmacy Assistant   Name: Ronald [name] [facility]  MRN: [id number] DOB: 27-Mar-1946  Patient called to be reminded of his telephone appointment with Junius Argyle, CPP on 12/07/2021 @ 1500.  Patient's spouse is aware of appointment date, time, and type of appointment (either telephone or in person). Patient aware to have/bring all medications, supplements, blood pressure and/or blood sugar logs to visit.  Star Rating Drug: Atorvastatin 40 mg last filled on 11/21/2021 for a 90-Day supply with [facility] Irbesartan 150 mg last filled on 11/03/2021 for a 90-Day supply with [facility]  Any gaps in medications fill history? No  Care Gaps: Diabetic Kidney Evaluation Zoster Vaccines Diabetic Foot Exam COVID-19 Vaccine Booster 3 BP>140/90  Lynann Bologna, CPA/CMA Clinical Pharmacist Assistant Phone: [phone]

## 2021-12-07 ENCOUNTER — Ambulatory Visit: Payer: Medicare Other

## 2021-12-07 DIAGNOSIS — I119 Hypertensive heart disease without heart failure: Secondary | ICD-10-CM

## 2021-12-07 MED ORDER — MAGNESIUM OXIDE -MG SUPPLEMENT 400 (240 MG) MG PO TABS: 1.0000 | ORAL_TABLET | Freq: Two times a day (BID) | ORAL | 1 refills | Status: DC

## 2021-12-07 NOTE — Progress Notes (Signed)
Chronic Care Management Pharmacy Note  12/08/2021 Name:  Caius Connelley [facility] MRN:  [id number] DOB:  03/27/1946  Summary: Today's visit was conducted with patient's spouse Rinaldo Cloud for CCM follow-up. He is due for a refill on his magnesium.   Patient's last potassium level was elevated.   Recommendations/Changes made from today's visit: Continue current medications Recommend rechecking potassium, magnesium   Plan: CPP follow-up in 6 months.   Subjective: KAMARIEN LANGRIDGE is an 76 y.o. year old male who is a primary patient of Maple Hudson., MD.  The CCM team was consulted for assistance with disease management and care coordination needs.    Engaged with patient by telephone for follow up visit in response to provider referral for pharmacy case management and/or care coordination services.   Consent to Services:  The patient was given information about Chronic Care Management services, agreed to services, and gave verbal consent prior to initiation of services.  Please see initial visit note for detailed documentation.   Patient Care Team: Maple Hudson., MD as PCP - General (Unknown Physician Specialty) Kathleene Hazel, MD as PCP - Cardiology (Cardiology) Mosetta Pigeon, MD as Consulting Physician (Internal Medicine) Nevada Crane, MD as Consulting Physician (Ophthalmology) Vernie Murders, MD (Otolaryngology) Gaspar Cola, [facility] (Pharmacist)  Recent office visits: 11/24/21: Patient presented to Dr. Sullivan Lone for follow-up.  07/27/21: Patient presented to Dr. Sullivan Lone for follow-up. Trazodone.   Recent consult visits: 10/01/21: Patient presented to Dr. Sherene Sires (pulmonology).  10/01/21: Patient presented to Dr. Sherene Sires (pulmonology). Stop losartan, start irbesartan,   Hospital visits: 10/21/21: Cataract extraction.   Objective:  Lab Results  Component Value Date   CREATININE 3.66 (H) 11/24/2021   BUN 58 (H) 11/24/2021   GFRNONAA 21 (L) 08/12/2021   GFRAA 28  (L) 03/20/2020   NA 141 11/24/2021   K 5.5 (H) 11/24/2021   CALCIUM 9.7 11/24/2021   CO2 22 11/24/2021   GLUCOSE 84 11/24/2021    Lab Results  Component Value Date/Time   HGBA1C 5.7 (H) 11/24/2021 04:25 PM   HGBA1C 5.7 (A) 07/27/2021 02:54 PM   HGBA1C 6.0 (H) 01/22/2021 04:18 PM   HGBA1C 6.2 09/26/2015 12:00 AM   MICROALBUR 50 04/30/2019 03:14 PM   MICROALBUR 100 01/11/2017 04:43 PM    Last diabetic Eye exam:  Lab Results  Component Value Date/Time   HMDIABEYEEXA No Retinopathy 10/05/2021 12:00 AM    Last diabetic Foot exam: No results found for: "HMDIABFOOTEX"   Lab Results  Component Value Date   CHOL 153 11/24/2021   HDL 60 11/24/2021   LDLCALC 82 11/24/2021   TRIG 54 11/24/2021   CHOLHDL 2.6 11/24/2021       Latest Ref Rng & Units 11/24/2021    4:25 PM 01/22/2021    4:18 PM 03/20/2020   10:38 AM  Hepatic Function  Total Protein 6.0 - 8.5 g/dL 6.5  6.3  5.8   Albumin 3.7 - 4.7 g/dL 4.4  4.3  4.1   AST 0 - 40 IU/L 26  20  21    ALT 0 - 44 IU/L 28  17  24    Alk Phosphatase 44 - 121 IU/L 155  155  163   Total Bilirubin 0.0 - 1.2 mg/dL 0.9  0.9  0.8     Lab Results  Component Value Date/Time   TSH 2.395 08/07/2021 03:36 PM   TSH 2.680 01/22/2021 04:18 PM   TSH 5.330 (H) 03/20/2020 10:38 AM  Latest Ref Rng & Units 10/01/2021    2:40 PM 08/07/2021    3:36 PM 01/22/2021    4:18 PM  CBC  WBC 4.0 - 10.5 K/uL 5.9  7.2  5.7   Hemoglobin 13.0 - 17.0 g/dL 29.5  62.1  30.8   Hematocrit 39.0 - 52.0 % 38.9  38.0  35.9   Platelets 150 - 400 K/uL 175  161  161     No results found for: "VD25OH"  Clinical ASCVD: Yes  The ASCVD Risk score (Arnett DK, et al., 2019) failed to calculate for the following reasons:   The patient has a prior MI or stroke diagnosis       10/28/2021   11:01 AM 11/03/2020    2:47 PM 04/28/2020   12:10 PM  Depression screen PHQ 2/9  Decreased Interest 0 0 0  Down, Depressed, Hopeless 0 0 0  PHQ - 2 Score 0 0 0  Altered sleeping  3    Tired, decreased energy  3   Change in appetite  0   Feeling bad or failure about yourself   0   Trouble concentrating  0   Moving slowly or fidgety/restless  0   Suicidal thoughts  0   PHQ-9 Score  6   Difficult doing work/chores  Somewhat difficult     Social History   Tobacco Use  Smoking Status Former   Packs/day: 3.00   Years: 20.00   Total pack years: 60.00   Types: Cigarettes   Quit date: 07/25/1986   Years since quitting: 35.3  Smokeless Tobacco Never   BP Readings from Last 3 Encounters:  11/24/21 (!) 145/62  11/09/21 (!) 156/68  10/21/21 (!) 166/65   Pulse Readings from Last 3 Encounters:  11/24/21 69  11/09/21 (!) 50  10/21/21 (!) 52   Wt Readings from Last 3 Encounters:  11/24/21 235 lb (106.6 kg)  11/09/21 236 lb (107 kg)  10/28/21 233 lb (105.7 kg)   BMI Readings from Last 3 Encounters:  11/24/21 34.70 kg/m  11/09/21 34.85 kg/m  10/28/21 34.41 kg/m    Assessment/Interventions: Review of patient past medical history, allergies, medications, health status, including review of consultants reports, laboratory and other test data, was performed as part of comprehensive evaluation and provision of chronic care management services.   SDOH:  (Social Determinants of Health) assessments and interventions performed: Yes    SDOH Screenings   Alcohol Screen: Low Risk  (10/28/2021)   Alcohol Screen    Last Alcohol Screening Score (AUDIT): 0  Depression (PHQ2-9): Low Risk  (10/28/2021)   Depression (PHQ2-9)    PHQ-2 Score: 0  Financial Resource Strain: Low Risk  (10/28/2021)   Overall Financial Resource Strain (CARDIA)    Difficulty of Paying Living Expenses: Not hard at all  Food Insecurity: No Food Insecurity (10/28/2021)   Hunger Vital Sign    Worried About Running Out of Food in the Last Year: Never true    Ran Out of Food in the Last Year: Never true  Housing: Low Risk  (10/28/2021)   Housing    Last Housing Risk Score: 0  Physical Activity:  Inactive (04/28/2020)   Exercise Vital Sign    Days of Exercise per Week: 0 days    Minutes of Exercise per Session: 0 min  Social Connections: Moderately Isolated (10/28/2021)   Social Connection and Isolation Panel [NHANES]    Frequency of Communication with Friends and Family: Twice a week    Frequency of Social  Gatherings with Friends and Family: Twice a week    Attends Religious Services: Never    Database administrator or Organizations: No    Attends Banker Meetings: Never    Marital Status: Married  Stress: No Stress Concern Present (10/28/2021)   [name] of Occupational Health - Occupational Stress Questionnaire    Feeling of Stress : Not at all  Tobacco Use: Medium Risk (11/24/2021)   Patient History    Smoking Tobacco Use: Former    Smokeless Tobacco Use: Never    Passive Exposure: Not on file  Transportation Needs: No Transportation Needs (10/28/2021)   PRAPARE - Administrator, Civil Service (Medical): No    Lack of Transportation (Non-Medical): No    CCM Care Plan  Allergies  Allergen Reactions   Predicort [Prednisolone] Other (See Comments)    Stomach pain   Ciprofloxacin     GI upset   Hydrochlorothiazide Other (See Comments)    Dehydration   Hydrocodone Nausea Only    Stomach upset   Hydrocodone-Acetaminophen Nausea Only    Stomach upset   Sulfa Antibiotics Other (See Comments)    Cannot recall   Sulfacetamide Sodium Other (See Comments)    Cannot recall   Sulfasalazine Other (See Comments)    Cannot recall   Penicillins Hives and Rash    Has patient had a PCN reaction causing immediate rash, facial/tongue/throat swelling, SOB or lightheadedness with hypotension: YES Has patient had a PCN reaction causing severe rash involving mucus membranes or skin necrosis: NO Has patient had a PCN reaction that required hospitalization NO Has patient had a PCN reaction occurring within the last 10 years:NO If all of the above  answers are "NO", then may proceed with Cephalosporin use.    Medications Reviewed Today     Reviewed by Marlene Lard, CMA (Certified Medical Assistant) on 11/24/21 at 1552  Med List Status: <None>   Medication Order Taking? Sig Documenting Provider Last Dose Status Informant  ACETAMINOPHEN PO [id number] Yes Take 650 mg by mouth. 2 at bedtime [provider] Taking Active   allopurinol (ZYLOPRIM) 100 MG tablet [id number] Yes TAKE 1 TABLET BY MOUTH EVERYDAY AT BEDTIME Maple Hudson., MD Taking Active   amiodarone (PACERONE) 200 MG tablet [id number] Yes TAKE 1 TABLET BY MOUTH  DAILY Kathleene Hazel, MD Taking Active   amLODipine (NORVASC) 10 MG tablet [id number] Yes TAKE 1 TABLET BY MOUTH EVERY DAY Linwood Dibbles Darl Householder, DO Taking Active   apixaban (ELIQUIS) 5 MG TABS tablet [id number] Yes Take 1 tablet (5 mg total) by mouth 2 (two) times daily. Kathleene Hazel, MD Taking Active   atorvastatin (LIPITOR) 40 MG tablet [id number] Yes TAKE 1 TABLET BY MOUTH EVERY DAY Maple Hudson., MD Taking Active   Cholecalciferol (VITAMIN D-3) 125 MCG (5000 UT) TABS [id number] Yes Take 2,000 Units by mouth daily.  [provider] Taking Active Self  furosemide (LASIX) 40 MG tablet [id number] Yes Take 40 mg by mouth daily as needed for fluid or edema.  [provider] Taking Active Spouse/Significant Other  irbesartan (AVAPRO) 150 MG tablet [id number] Yes Take 1 tablet (150 mg total) by mouth daily. Nyoka Cowden, MD Taking Active   isosorbide mononitrate (IMDUR) 30 MG 24 hr tablet [id number] Yes TAKE 1 TABLET BY MOUTH  DAILY Kathleene Hazel, MD Taking Active   levothyroxine (SYNTHROID) 75 MCG tablet [id number] Yes TAKE 1 TABLET BY MOUTH DAILY BEFORE BREAKFAST. Sullivan Lone,  Leonette Monarch., MD Taking Active   loratadine (CLARITIN) 10 MG tablet [id number] Yes Take 10 mg by mouth daily as needed for allergies. [provider] Taking Active Spouse/Significant  Other  magnesium oxide (MAG-OX) 400 (240 Mg) MG tablet [id number] Yes TAKE 1 TABLET BY MOUTH TWICE A DAY Maple Hudson., MD Taking Active   metoprolol tartrate (LOPRESSOR) 100 MG tablet [id number] Yes TAKE 1 TABLET BY MOUTH  TWICE DAILY Kathleene Hazel, MD Taking Active   Multiple Vitamin (MULTIVITAMIN PO) [id number] Yes Take 1 tablet by mouth daily. [provider] Taking Active Spouse/Significant Other  nitroGLYCERIN (NITROSTAT) 0.4 MG SL tablet [id number] Yes Place 0.4 mg under the tongue every 5 (five) minutes as needed for chest pain (Up to 3 times).  [provider] Taking Active Spouse/Significant Other  pantoprazole (PROTONIX) 40 MG tablet [id number] Yes TAKE 2 TABLETS BY MOUTH DAILY Maple Hudson., MD Taking Active   polyethylene glycol [facility] / GLYCOLAX) 17 g packet [id number] Yes Take 17 g by mouth daily. [provider] Taking Active   sodium bicarbonate 650 MG tablet [id number] Yes Take 650 mg by mouth 2 (two) times daily. Evening [provider] Taking Active Spouse/Significant Other            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   Bursitis of hip 12/18/2018   Pain in lower limb 12/18/2018   A-fib (HCC) 06/10/2018   CKD (chronic kidney disease), stage IV (HCC) 07/29/2017   Obesity 07/29/2017   Unstable angina (HCC) 07/27/2017   Atherosclerosis of native arteries of extremity with intermittent claudication (HCC) 06/25/2016   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   Coronary artery disease involving native coronary artery of native heart with unstable angina pectoris (HCC)    Allergic rhinitis 08/19/2015   NSTEMI (non-ST elevated myocardial infarction) (HCC) 12/13/2014   AA (aortic aneurysm) (HCC) 10/04/2014    Arteriosclerosis of coronary artery 10/04/2014   Diabetes mellitus, type 2 (HCC) 10/04/2014   Acid reflux 10/04/2014   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   HLD (hyperlipidemia) 10/04/2014   BP (high blood pressure) 10/04/2014   Osteoarthrosis 10/04/2014   Adult BMI 30+ 10/04/2014   Basal cell papilloma 10/04/2014   Hypertensive heart disease without CHF 12/30/2012   Genital candidiasis in male 10/25/2012   Urge incontinence of urine 10/24/2012   Benign prostatic hyperplasia with urinary obstruction 10/24/2012   Incomplete emptying of bladder 10/24/2012   Other obstructive and reflux uropathy 10/24/2012   Benign prostatic hyperplasia with lower urinary tract symptoms 10/24/2012   Abdominal pain 11/09/2010   DOE (dyspnea on exertion) 11/12/2009   Renal artery stenosis     Mixed hyperlipidemia 09/04/2008   History of redo bypass grafting 09/04/2008   Diabetes mellitus with nephropathy (HCC)     Immunization History  Administered Date(s) Administered   Fluad Quad(high Dose 65+) 03/01/2019, 03/19/2020, 02/25/2021   Influenza, High Dose Seasonal PF 03/18/2015, 03/02/2016, 02/13/2018   Influenza-Unspecified 03/14/2017   Moderna Sars-Covid-2 Vaccination 07/28/2019, 08/25/2019   Pneumococcal Conjugate-13 12/25/2013   Pneumococcal Polysaccharide-23 03/04/2009   Td 10/10/2003, 04/16/2016    Conditions to be addressed/monitored:  Hypertension, Atrial Fibrillation, Coronary Artery Disease, GERD, Chronic Kidney Disease, and Allergic Rhinitis  Care Plan : General Pharmacy (Adult)  Updates made by Gaspar Cola, RPH since 12/08/2021 12:00 AM  Problem: Hypertension, Atrial Fibrillation, Coronary Artery Disease, GERD, Chronic Kidney Disease, and Allergic Rhinitis   Priority: High     Long-Range Goal: Patient-Specific Goal   Start Date: 12/12/2020  Expected End Date: 06/13/2021  This Visit's Progress: On track  Recent Progress: On track  Priority: High  Note:   Current Barriers:   Unable to independently monitor therapeutic efficacy  Pharmacist Clinical Goal(s):  Patient will maintain control of blood pressure as evidenced by BP less than 140/90  through collaboration with PharmD and provider.   Interventions: 1:1 collaboration with Maple Hudson., MD regarding development and update of comprehensive plan of care as evidenced by provider attestation and co-signature Inter-disciplinary care team collaboration (see longitudinal plan of care) Comprehensive medication review performed; medication list updated in electronic medical record  Hypertension (BP goal <140/90) -Controlled -Current treatment: Amlodipine 10 mg daily  Enalapril 20 mg daily  Furosemide 40 mg daily as needed  Imdur 30 mg daily  Metoprolol 100 mg twice daily  -Medications previously tried: NA -Reports some instances of significant dizziness spells , 2-3 instances in past week.  -Current home readings: 140/78  -Denies hypotensive/hypertensive symptoms -Recommended to continue current medication  Atrial Fibrillation (Goal: prevent stroke and major bleeding) -Controlled -CHADSVASC: 3 -Current treatment: Rate control: Amiodarone 200 mg daily  Metoprolol tartrate 100 mg twice daily  Anticoagulation: Eliquis 5 mg twice daily  -Medications previously tried: NA -Recommended to continue current medication  Hyperlipidemia: (LDL goal < 70) -Controlled -History of NSTEMI -Current treatment: Atorvastatin 40 mg daily  -Medications previously tried: NA  -Educated on Strategies to manage statin-induced myalgias; -Recommended to continue current medication  Hypothyroidism (Goal: Maintain stable thyroid function) -Controlled -Current treatment  Levothyroxine 75 mcg daily before breakfast  -Medications previously tried: NA  -Counseled on diet and exercise extensively  Chronic Kidney Disease Stage 4  -All medications assessed for renal dosing and appropriateness in chronic kidney  disease. -Recommended to continue current medication  Patient Goals/Self-Care Activities Patient will:  - check blood pressure 2-3 times weekly, document, and provide at future appointments  Follow Up Plan: Telephone follow up appointment with care management team member scheduled for:  06/07/2022 at 3:00 PM    Medication Assistance:  Eliquis obtained through [employer] medication assistance program.  Enrollment ends Dec 2023. Applied through Dr. Gibson Ramp office.   Compliance/Adherence/Medication fill history: Care Gaps: Covid Booster Ophthalmology Exam   Star-Rating Drugs: Atorvastatin 40 mg last filled on 11/28/2020 for 90 day supply at [facility].  Patient's preferred pharmacy is:  [facility] [address] [place], [place] - [address] [place] [address] Phone: [phone] Fax: [phone]  [facility] ([facility] ) - [place], [place] - [address] [place] [address] Phone: [phone] Fax: [phone]  Uses pill box? Yes Pt endorses 100% compliance  We discussed: Current pharmacy is preferred with insurance plan and patient is satisfied with pharmacy services Patient decided to: Continue current medication management strategy  Care Plan and Follow Up Patient Decision:  Patient agrees to Care Plan and Follow-up.  Plan: Telephone follow up appointment with care management team member scheduled for:  06/07/2022 at 3:00 PM  Angelena Sole, PharmD, Patsy Baltimore, CPP  Clinical Pharmacist Practitioner  [facility] [phone]

## 2021-12-08 DIAGNOSIS — Z0100 Encounter for examination of eyes and vision without abnormal findings: Secondary | ICD-10-CM | POA: Diagnosis not present

## 2021-12-08 NOTE — Patient Instructions (Signed)
Visit Information It was great speaking with you today!  Please let me know if you have any questions about our visit.   Goals Addressed             This Visit's Progress    Track and Manage My Blood Pressure-Hypertension   On track    Timeframe:  Long-Range Goal Priority:  High Start Date: 12/11/2020                             Expected End Date:  12/12/2022                      Follow Up within 90 days   - check blood pressure 3 times per week    Why is this important?   You won't feel high blood pressure, but it can still hurt your blood vessels.  High blood pressure can cause heart or kidney problems. It can also cause a stroke.  Making lifestyle changes like losing a little weight or eating less salt will help.  Checking your blood pressure at home and at different times of the day can help to control blood pressure.  If the doctor prescribes medicine remember to take it the way the doctor ordered.  Call the office if you cannot afford the medicine or if there are questions about it.     Notes:         Patient Care Plan: General Pharmacy (Adult)     Problem Identified: Hypertension, Atrial Fibrillation, Coronary Artery Disease, GERD, Chronic Kidney Disease, and Allergic Rhinitis   Priority: High     Long-Range Goal: Patient-Specific Goal   Start Date: 12/12/2020  Expected End Date: 06/13/2021  This Visit's Progress: On track  Recent Progress: On track  Priority: High  Note:   Current Barriers:  Unable to independently monitor therapeutic efficacy  Pharmacist Clinical Goal(s):  Patient will maintain control of blood pressure as evidenced by BP less than 140/90  through collaboration with PharmD and provider.   Interventions: 1:1 collaboration with [name]., MD regarding development and update of comprehensive plan of care as evidenced by provider attestation and co-signature Inter-disciplinary care team collaboration (see longitudinal plan of  care) Comprehensive medication review performed; medication list updated in electronic medical record  Hypertension (BP goal <140/90) -Controlled -Current treatment: Amlodipine 10 mg daily  Enalapril 20 mg daily  Furosemide 40 mg daily as needed  Imdur 30 mg daily  Metoprolol 100 mg twice daily  -Medications previously tried: NA -Reports some instances of significant dizziness spells , 2-3 instances in past week.  -Current home readings: 140/78  -Denies hypotensive/hypertensive symptoms -Recommended to continue current medication  Atrial Fibrillation (Goal: prevent stroke and major bleeding) -Controlled -CHADSVASC: 3 -Current treatment: Rate control: Amiodarone 200 mg daily  Metoprolol tartrate 100 mg twice daily  Anticoagulation: Eliquis 5 mg twice daily  -Medications previously tried: NA -Recommended to continue current medication  Hyperlipidemia: (LDL goal < 70) -Controlled -History of NSTEMI -Current treatment: Atorvastatin 40 mg daily  -Medications previously tried: NA  -Educated on Strategies to manage statin-induced myalgias; -Recommended to continue current medication  Hypothyroidism (Goal: Maintain stable thyroid function) -Controlled -Current treatment  Levothyroxine 75 mcg daily before breakfast  -Medications previously tried: NA  -Counseled on diet and exercise extensively  Chronic Kidney Disease Stage 4  -All medications assessed for renal dosing and appropriateness in chronic kidney disease. -Recommended to continue current  medication  Patient Goals/Self-Care Activities Patient will:  - check blood pressure 2-3 times weekly, document, and provide at future appointments  Follow Up Plan: Telephone follow up appointment with care management team member scheduled for:  06/07/2022 at 3:00 PM    Patient agreed to services and verbal consent obtained.   Patient verbalizes understanding of instructions and care plan provided today and agrees to view in  [facility]. Active MyChart status and patient understanding of how to access instructions and care plan via MyChart confirmed with patient.     Junius Argyle, PharmD, Para March, CPP  Clinical Pharmacist Practitioner  [facility] [phone]

## 2021-12-10 NOTE — Progress Notes (Deleted)
No chief complaint on file.   History of Present Illness: 76 yo male with history of CAD s/p CABG in 1996 (LIMA-LAD, SVG-circumflex, SVG-RCA, SVG-diagonal) with redo bypass in 2007 (SVG-OM, SVG-RCA), ischemic cardiomyopathy, CKD, DM 2, HTN, HLD, AAA, renal artery stenosis, COPD, GERD, atrial fibrillation and PVCs here today for cardiac follow up. He was hospitalized at [facility] July 2016 with a NSTEMI. Cardiac cath with severe disease in the SVG to OM treated with a drug eluting stent. He was treated with Brilinta post PCI but developed dyspnea and was changed to Plavix. He was admitted to [facility] May 2017 with unstable angina and cardiac cath showed severe disease in the distal body/anastomosis of the SVG to Diagonal.  A drug eluting stent was placed in this vein graft. The LIMA to LAD was patent, SVG to OM was patent, SVG to PDA/PLA was patent. He was admitted to [facility] February 2018 with chest pain. Troponin was negative x 3. He did not have an ischemic evaluation. Echo February 2018 with  mild LV systolic dysfunction. LVEF=45-50%. I saw him in the office in January 2019 and he was volume overloaded. Lasix was started. He was admitted to [facility] February 2019 with unstable angina and cardiac cath showed patency of all of the bypass grafts. Echo February 2019 with normal LV systolic function, AJOI=78-67%, grade 1 diastolic dysfunction. He was admitted to [facility] January 2020 with atrial fib/flutter with RVR. Started on Xarelto and initially rate controlled with IV Cardizem. He was loaded with IV amiodarone and discharged home on po amiodarone and Lopressor (Coreg stopped during admission). His Plavix and Norvasc were stopped at discharge. His Norvasc was restarted in primary care. Echo January 2020 with LVEF=30-35%. Mild MR. Abdominal u/s April 2022 with 3.1 cm AAA, stable.   He is here today for follow up. The patient denies any chest pain, dyspnea, palpitations, lower extremity edema, orthopnea, PND, dizziness, near  syncope or syncope.He has baseline chronic dyspnea.   Primary Care Physician: [name]., MD  Past Medical History:  Diagnosis Date   AAA (abdominal aortic aneurysm) ([place])    a. 3cm by [place] 2015.   Arthritis    "hips; back" (12/13/2014)   CAD (coronary artery disease) 2007   a. s/p CABG- IMA-LAD, VG-Cx, VG-RCA, VG-diag in 1999. B. sp redo CABG- VG-OM, VG-RCA in 2007 due to VG disease. c. NSTEMI 11/2014 s/p DES to SVG-OM from the Y graft.d. PTCA/DES x 1 distal body of SVG to Diagonal.09/2015   Chronic combined systolic and diastolic CHF (congestive heart failure) ([place])    a. remote EF 40-45% in 2006. b. Normal EF 2014. b. Echo 07/2016 EF 45-50%, grade 1 DD. c. Echo 2020 30% to 35%. Diffuse EF 30-35%, diffuse hypokinesis.   Chronic lower back pain    CKD (chronic kidney disease), stage IV (HCC)    COPD (chronic obstructive pulmonary disease) (HCC)    Deafness in left ear    Degenerative disc disease, lumbar    Dilated cardiomyopathy ([place]) 10/07/2015   Emphysema    Esophageal stricture 07/02/1998   EGD   GERD (gastroesophageal reflux disease)    History of gout    "last flareup was in 2007" (12/13/2014)   History of hiatal hernia    Hyperlipidemia    Hypertension    Ischemic cardiomyopathy 2006   Echo 2020: EF 30-35%, diffuse hypokinesis   PVC's (premature ventricular contractions)    Renal artery stenosis ([place])    a. noted on CT 2008.  Type II diabetes mellitus (HCC)    Diet control    Walking pneumonia 1990's   Wears dentures    full upper    Past Surgical History:  Procedure Laterality Date   CARDIAC CATHETERIZATION  "several"   CARDIAC CATHETERIZATION N/A 12/13/2014   Procedure: Left Heart Cath and Coronary Angiography;  Surgeon: Jettie Booze, MD;  Location: [place] [facility];  Service: Cardiovascular;  Laterality: N/A;   CARDIAC CATHETERIZATION  12/13/2014   Procedure: Coronary Stent Intervention;  Surgeon: Jettie Booze, MD;  Location: [place]  [facility];  Service: Cardiovascular;;   CARDIAC CATHETERIZATION N/A 10/07/2015   Procedure: Left Heart Cath and Cors/Grafts Angiography;  Surgeon: Burnell Blanks, MD;  Location: [place] [facility];  Service: Cardiovascular;  Laterality: N/A;   CARDIAC CATHETERIZATION N/A 10/07/2015   Procedure: Coronary Stent Intervention;  Surgeon: Burnell Blanks, MD;  Location: [place] [facility];  Service: Cardiovascular;  Laterality: N/A;   CATARACT EXTRACTION W/PHACO Left 10/21/2021   Procedure: CATARACT EXTRACTION PHACO AND INTRAOCULAR LENS PLACEMENT (IOC) LEFT 3.94 00:33.7;  Surgeon: Eulogio Bear, MD;  Location: [place];  Service: Ophthalmology;  Laterality: Left;   CATARACT EXTRACTION W/PHACO Right 11/09/2021   Procedure: CATARACT EXTRACTION PHACO AND INTRAOCULAR LENS PLACEMENT (IOC) RIGHT;  Surgeon: Eulogio Bear, MD;  Location: [place];  Service: Ophthalmology;  Laterality: Right;  3.59 0:29.4   CORONARY ANGIOPLASTY  "several"   CORONARY ANGIOPLASTY WITH STENT PLACEMENT  2005; 12/13/2014   "2; 1"   CORONARY ARTERY BYPASS GRAFT  1996   CABG X5   CORONARY ARTERY BYPASS GRAFT  March 2007   CABG X3   ESOPHAGOGASTRODUODENOSCOPY (EGD) WITH ESOPHAGEAL DILATION  2000   GREEN LIGHT LASER TURP (TRANSURETHRAL RESECTION OF PROSTATE  2000's   "not cancerous"   HERNIA REPAIR     LAPAROSCOPIC CHOLECYSTECTOMY     LEFT HEART CATH AND CORS/GRAFTS ANGIOGRAPHY N/A 07/28/2017   Procedure: LEFT HEART CATH AND CORS/GRAFTS ANGIOGRAPHY;  Surgeon: Troy Sine, MD;  Location: [place] [facility];  Service: Cardiovascular;  Laterality: N/A;   LUNG SURGERY  1996   "S/P CABG, had to put staple in lung after it had collapsed"   UMBILICAL HERNIA REPAIR     w/chole    Current Outpatient Medications  Medication Sig Dispense Refill   ACETAMINOPHEN PO Take 650 mg by mouth. 2 at bedtime     allopurinol (ZYLOPRIM) 100 MG tablet TAKE 1 TABLET BY MOUTH EVERYDAY AT BEDTIME 90 tablet 3    amiodarone (PACERONE) 200 MG tablet TAKE 1 TABLET BY MOUTH  DAILY 90 tablet 2   amLODipine (NORVASC) 10 MG tablet TAKE 1 TABLET BY MOUTH EVERY DAY 90 tablet 1   apixaban (ELIQUIS) 5 MG TABS tablet Take 1 tablet (5 mg total) by mouth 2 (two) times daily. 180 tablet 1   atorvastatin (LIPITOR) 40 MG tablet TAKE 1 TABLET BY MOUTH EVERY DAY 90 tablet 2   Cholecalciferol (VITAMIN D-3) 125 MCG (5000 UT) TABS Take 2,000 Units by mouth daily.      furosemide (LASIX) 40 MG tablet Take 40 mg by mouth daily as needed for fluid or edema.   6   gabapentin (NEURONTIN) 100 MG capsule Take 1 capsule (100 mg total) by mouth 3 (three) times daily. 90 capsule 3   irbesartan (AVAPRO) 150 MG tablet Take 1 tablet (150 mg total) by mouth daily. 30 tablet 11   isosorbide mononitrate (IMDUR) 30 MG 24 hr  tablet TAKE 1 TABLET BY MOUTH  DAILY 90 tablet 0   levothyroxine (SYNTHROID) 75 MCG tablet TAKE 1 TABLET BY MOUTH DAILY BEFORE BREAKFAST. 90 tablet 2   loratadine (CLARITIN) 10 MG tablet Take 10 mg by mouth daily as needed for allergies.     magnesium oxide (MAG-OX) 400 (240 Mg) MG tablet Take 1 tablet (400 mg total) by mouth 2 (two) times daily. 180 tablet 1   metoprolol tartrate (LOPRESSOR) 100 MG tablet TAKE 1 TABLET BY MOUTH  TWICE DAILY 180 tablet 2   Multiple Vitamin (MULTIVITAMIN PO) Take 1 tablet by mouth daily.     nitroGLYCERIN (NITROSTAT) 0.4 MG SL tablet Place 0.4 mg under the tongue every 5 (five) minutes as needed for chest pain (Up to 3 times).      pantoprazole (PROTONIX) 40 MG tablet TAKE 2 TABLETS BY MOUTH DAILY 180 tablet 3   polyethylene glycol (MIRALAX / GLYCOLAX) 17 g packet Take 17 g by mouth daily.     sodium bicarbonate 650 MG tablet Take 650 mg by mouth 2 (two) times daily. Evening     No current facility-administered medications for this visit.    Allergies  Allergen Reactions   Predicort [Prednisolone] Other (See Comments)    Stomach pain   Ciprofloxacin     GI upset    Hydrochlorothiazide Other (See Comments)    Dehydration   Hydrocodone Nausea Only    Stomach upset   Hydrocodone-Acetaminophen Nausea Only    Stomach upset   Sulfa Antibiotics Other (See Comments)    Cannot recall   Sulfacetamide Sodium Other (See Comments)    Cannot recall   Sulfasalazine Other (See Comments)    Cannot recall   Penicillins Hives and Rash    Has patient had a PCN reaction causing immediate rash, facial/tongue/throat swelling, SOB or lightheadedness with hypotension: YES Has patient had a PCN reaction causing severe rash involving mucus membranes or skin necrosis: NO Has patient had a PCN reaction that required hospitalization NO Has patient had a PCN reaction occurring within the last 10 years:NO If all of the above answers are "NO", then may proceed with Cephalosporin use.    Social History   Socioeconomic History   Marital status: Married    Spouse name: Not on file   Number of children: 2   Years of education: Not on file   Highest education level: 8th grade  Occupational History   Occupation: Retired  Tobacco Use   Smoking status: Former    Packs/day: 3.00    Years: 20.00    Total pack years: 60.00    Types: Cigarettes    Quit date: 07/25/1986    Years since quitting: 35.4   Smokeless tobacco: Never  Vaping Use   Vaping Use: Never used  Substance and Sexual Activity   Alcohol use: No    Alcohol/week: 0.0 standard drinks of alcohol   Drug use: No   Sexual activity: Not Currently  Other Topics Concern   Not on file  Social History Narrative   Did auto salvage work.   Lives at home with his wife.  Independent at baseline.   Social Determinants of Health   Financial Resource Strain: Low Risk  (10/28/2021)   Overall Financial Resource Strain (CARDIA)    Difficulty of Paying Living Expenses: Not hard at all  Food Insecurity: No Food Insecurity (10/28/2021)   Hunger Vital Sign    Worried About Running Out of Food in the Last Year: Never true  Ran Out of Food in the Last Year: Never true  Transportation Needs: No Transportation Needs (10/28/2021)   PRAPARE - Hydrologist (Medical): No    Lack of Transportation (Non-Medical): No  Physical Activity: Inactive (04/28/2020)   Exercise Vital Sign    Days of Exercise per Week: 0 days    Minutes of Exercise per Session: 0 min  Stress: No Stress Concern Present (10/28/2021)   [place]    Feeling of Stress : Not at all  Social Connections: Moderately Isolated (10/28/2021)   Social Connection and Isolation Panel [NHANES]    Frequency of Communication with Friends and Family: Twice a week    Frequency of Social Gatherings with Friends and Family: Twice a week    Attends Religious Services: Never    Marine scientist or Organizations: No    Attends Archivist Meetings: Never    Marital Status: Married  Human resources officer Violence: Not At Risk (10/28/2021)   Humiliation, Afraid, Rape, and Kick questionnaire    Fear of Current or Ex-Partner: No    Emotionally Abused: No    Physically Abused: No    Sexually Abused: No    Family History  Problem Relation Age of Onset   Heart attack Mother        MI   Stroke Mother    Heart disease Mother    Hypertension Mother    Hyperlipidemia Mother    Heart disease Father    Rheumatic fever Father    Colon cancer Neg Hx     Review of Systems:  As stated in the HPI and otherwise negative.   There were no vitals taken for this visit.  Physical Examination:  General: Well developed, well nourished, NAD  HEENT: OP clear, mucus membranes moist  SKIN: warm, dry. No rashes. Neuro: No focal deficits  Musculoskeletal: Muscle strength 5/5 all ext  Psychiatric: Mood and affect normal  Neck: No JVD, no carotid bruits, no thyromegaly, no lymphadenopathy.  Lungs:Clear bilaterally, no wheezes, rhonci, crackles Cardiovascular: Regular rate and  rhythm. No murmurs, gallops or rubs. Abdomen:Soft. Bowel sounds present. Non-tender.  Extremities: No lower extremity edema. Pulses are 2 + in the bilateral DP/PT.  Echo January 2020: Left ventricle: The cavity size was moderately dilated. Systolic    function was moderately to severely reduced. The estimated    ejection fraction was in the range of 30% to 35%. Diffuse    hypokinesis. The study is not technically sufficient to allow    evaluation of LV diastolic function.  - Mitral valve: There was mild regurgitation.  - Left atrium: The atrium was mildly dilated.  - Right ventricle: The cavity size was mildly dilated. Wall    thickness was normal. Systolic function was mildly reduced  Cardiac cath February 2019: Significant native CAD with total occlusion of the LAD after the takeoff of the first diagonal vessel; total occlusion of the proximal left circumflex coronary artery; and total occlusion of the proximal RCA with antegrade bridging collaterals.  Patent LIMA to LAD.  Patent Y vein graft from the 1996 surgery which supplies the diagonal vessel and distal circumflex marginal vessel.  There is diffuse narrowing of 50% in the midportion of the Y graft supplying the diagonal vessel and a patent distal stent extending to the ostium of the diagonal.  The Y graft supplying the distal marginal is free of significant disease and has mild luminal irregularity.  Patent SVG supplying the distal RCA from the 2007 surgery with a stent in the proximal portion of the graft with 30% narrowings in the proximal and mid segment.  LVEDP 18 mm  Diagnostic Diagram        EKG:  EKG is  *** ordered today. The ekg ordered today demonstrates   Recent Labs: 08/07/2021: TSH 2.395 10/01/2021: Hemoglobin 12.6; Platelets 175 11/24/2021: ALT 28; BUN 58; Creatinine, Ser 3.66; Potassium 5.5; Sodium 141   Lipid Panel    Component Value Date/Time   CHOL 153 11/24/2021 1625   TRIG 54 11/24/2021 1625   HDL 60  11/24/2021 1625   CHOLHDL 2.6 11/24/2021 1625   CHOLHDL 4.5 12/13/2014 0423   VLDL 25 12/13/2014 0423   LDLCALC 82 11/24/2021 1625     Wt Readings from Last 3 Encounters:  11/24/21 235 lb (106.6 kg)  11/09/21 236 lb (107 kg)  10/28/21 233 lb (105.7 kg)     Other studies Reviewed: Additional studies/ records that were reviewed today include: . Review of the above records demonstrates:    Assessment and Plan:   1. CAD s/p CABG without angina: Cardiac cath February 2019 with 4 patent vein grafts. No chest pain suggestive of angina. Will continue statin, Imdur and beta blocker. He is off of ASA due to easy bruising. He is also on Eliquis.       2. Ischemic Cardiomyopathy: LVEF =35% by echo at [facility] January 2020. No Ace-inh or ARB due to CKD. Will continue beta blocker. *** ? Repeat echo now.   3. Hypertension: BP is well controlled. No changes  4. Hyperlipidemia: Lipids followed in primary care. LDL ***. Continue statin.  5. CKD: Followed by Nephrology. Ace-inh stopped due to renal insufficiency.   6. Chronic systolic CHF: Weight is stable. No volume overload on exam. Continue Lasix as needed.   7. PVCs/Bigeminy: No palpitations. Continue beta blocker  8. AAA: Slable at 3.1 cm by u/s April 2022.   *** Repeat now.   9. Atrial fibrillation, paroxysmal: Sinus today. Continue beta blocker, amiodarone and Eliquis. TSH and chest xray ok March 2023.   10. Sleep apnea: he is on CPAP.  Current medicines are reviewed at length with the patient today.  The patient does not have concerns regarding medicines.  The following changes have been made:  no change  Labs/ tests ordered today include:   No orders of the defined types were placed in this encounter.    Disposition:   FU with me in 12 months.    Signed, Lauree Chandler, MD 12/10/2021 3:51 PM    [facility] [address], [place], [place]  [address] Phone: [phone]; Fax: [phone]

## 2021-12-11 ENCOUNTER — Ambulatory Visit: Payer: Medicare Other | Admitting: Cardiovascular Disease

## 2021-12-15 DIAGNOSIS — H353132 Nonexudative age-related macular degeneration, bilateral, intermediate dry stage: Secondary | ICD-10-CM | POA: Diagnosis not present

## 2021-12-16 ENCOUNTER — Ambulatory Visit: Payer: Self-pay | Admitting: *Deleted

## 2021-12-16 NOTE — Telephone Encounter (Signed)
  Chief Complaint: Leg Swelling Symptoms: Both legs swollen, ankles to knees, mild-moderate. Left leg red, warm to touch with diffuse redness from ankle to mid leg, very tender to touch Frequency: LAst night Pertinent Negatives: Patient denies fever, SOB "No more than usual" Disposition: '[x]'$ ED /'[]'$ Urgent Care (no appt availability in office) / '[]'$ Appointment(In office/virtual)/ '[]'$  [facility] [facility]/ '[]'$ Home Care/ '[]'$ Refused Recommended Disposition /'[]'$ [place] [facility]/ '[]'$  Follow-up with PCP Additional Notes: Advised ED, wife states will try [facility] first, advised may be directed to ED. Verbalizes understanding.

## 2021-12-16 NOTE — Telephone Encounter (Signed)
Reason for Disposition  [1] Red area or streak [2] large (> 2 in. or 5 cm)  Answer Assessment - Initial Assessment Questions 1. ONSET: "When did the swelling start?" (e.g., minutes, hours, days)     Monday 2. LOCATION: "What part of the leg is swollen?"  "Are both legs swollen or just one leg?"     Ankle to knee 3. SEVERITY: "How bad is the swelling?" (e.g., localized; mild, moderate, severe)   - Localized: Small area of swelling localized to one leg.   - MILD pedal edema: Swelling limited to foot and ankle, pitting edema < 1/4 inch (6 mm) deep, rest and elevation eliminate most or all swelling.   - MODERATE edema: Swelling of lower leg to knee, pitting edema > 1/4 inch (6 mm) deep, rest and elevation only partially reduce swelling.   - SEVERE edema: Swelling extends above knee, facial or hand swelling present.      Mild-moderate 4. REDNESS: "Does the swelling look red or infected?"     Red "Under skin" left leg from ankle to mid leg. Last night 5. PAIN: "Is the swelling painful to touch?" If Yes, ask: "How painful is it?"   (Scale 1-10; mild, moderate or severe)     6-7/10 6. FEVER: "Do you have a fever?" If Yes, ask: "What is it, how was it measured, and when did it start?"      no 7. CAUSE: "What do you think is causing the leg swelling?"     Unsure 8. MEDICAL HISTORY: "Do you have a history of blood clots (e.g., DVT), cancer, heart failure, kidney disease, or liver failure?"      9. RECURRENT SYMPTOM: "Have you had leg swelling before?" If Yes, ask: "When was the last time?" "What happened that time?"      10. OTHER SYMPTOMS: "Do you have any other symptoms?" (e.g., chest pain, difficulty breathing)       "Drained" no energy,SOB as usual, "No worsening"  Protocols used: Leg Swelling and Edema-A-AH

## 2021-12-20 ENCOUNTER — Other Ambulatory Visit: Payer: Self-pay | Admitting: Family Medicine

## 2021-12-20 DIAGNOSIS — K219 Gastro-esophageal reflux disease without esophagitis: Secondary | ICD-10-CM

## 2021-12-22 DIAGNOSIS — N184 Chronic kidney disease, stage 4 (severe): Secondary | ICD-10-CM | POA: Diagnosis not present

## 2021-12-27 NOTE — Progress Notes (Unsigned)
No chief complaint on file.   History of Present Illness: 76 yo male with history of CAD s/p CABG in 1996 (LIMA-LAD, SVG-circumflex, SVG-RCA, SVG-diagonal) with redo bypass in 2007 (SVG-OM, SVG-RCA), ischemic cardiomyopathy, CKD, DM 2, HTN, HLD, AAA, renal artery stenosis, COPD, GERD, atrial fibrillation and PVCs here today for cardiac follow up. He was hospitalized at [facility] July 2016 with a NSTEMI. Cardiac cath with severe disease in the SVG to OM treated with a drug eluting stent. He was treated with Brilinta post PCI but developed dyspnea and was changed to Plavix. He was admitted to [facility] May 2017 with unstable angina and cardiac cath showed severe disease in the distal body/anastomosis of the SVG to Diagonal.  A drug eluting stent was placed in this vein graft. The LIMA to LAD was patent, SVG to OM was patent, SVG to PDA/PLA was patent. He was admitted to [facility] February 2018 with chest pain. Troponin was negative x 3. He did not have an ischemic evaluation. Echo February 2018 with  mild LV systolic dysfunction. LVEF=45-50%. I saw him in the office in January 2019 and he was volume overloaded. Lasix was started. He was admitted to [facility] February 2019 with unstable angina and cardiac cath showed patency of all of the bypass grafts. Echo February 2019 with normal LV systolic function, VOJJ=00-93%, grade 1 diastolic dysfunction. He was admitted to [facility] January 2020 with atrial fib/flutter with RVR. Started on Xarelto and initially rate controlled with IV Cardizem. He was loaded with IV amiodarone and discharged home on po amiodarone and Lopressor (Coreg stopped during admission). His Plavix and Norvasc were stopped at discharge. His Norvasc was restarted in primary care. Echo January 2020 with LVEF=30-35%. Mild MR. Abdominal u/s April 2022 with 3.1 cm AAA, stable.    He is here today for follow up. The patient denies any chest pain, dyspnea, palpitations, lower extremity edema, orthopnea, PND, dizziness, near  syncope or syncope.   Primary Care Physician: [name]., MD  Past Medical History:  Diagnosis Date   AAA (abdominal aortic aneurysm) ([place])    a. 3cm by [place] 2015.   Arthritis    "hips; back" (12/13/2014)   CAD (coronary artery disease) 2007   a. s/p CABG- IMA-LAD, VG-Cx, VG-RCA, VG-diag in 1999. B. sp redo CABG- VG-OM, VG-RCA in 2007 due to VG disease. c. NSTEMI 11/2014 s/p DES to SVG-OM from the Y graft.d. PTCA/DES x 1 distal body of SVG to Diagonal.09/2015   Chronic combined systolic and diastolic CHF (congestive heart failure) ([place])    a. remote EF 40-45% in 2006. b. Normal EF 2014. b. Echo 07/2016 EF 45-50%, grade 1 DD. c. Echo 2020 30% to 35%. Diffuse EF 30-35%, diffuse hypokinesis.   Chronic lower back pain    CKD (chronic kidney disease), stage IV (HCC)    COPD (chronic obstructive pulmonary disease) (HCC)    Deafness in left ear    Degenerative disc disease, lumbar    Dilated cardiomyopathy ([place]) 10/07/2015   Emphysema    Esophageal stricture 07/02/1998   EGD   GERD (gastroesophageal reflux disease)    History of gout    "last flareup was in 2007" (12/13/2014)   History of hiatal hernia    Hyperlipidemia    Hypertension    Ischemic cardiomyopathy 2006   Echo 2020: EF 30-35%, diffuse hypokinesis   PVC's (premature ventricular contractions)    Renal artery stenosis ([place])    a. noted on CT 2008.   Type II  diabetes mellitus ([place])    Diet control    Walking pneumonia 1990's   Wears dentures    full upper    Past Surgical History:  Procedure Laterality Date   CARDIAC CATHETERIZATION  "several"   CARDIAC CATHETERIZATION N/A 12/13/2014   Procedure: Left Heart Cath and Coronary Angiography;  Surgeon: Jettie Booze, MD;  Location: [place] [facility];  Service: Cardiovascular;  Laterality: N/A;   CARDIAC CATHETERIZATION  12/13/2014   Procedure: Coronary Stent Intervention;  Surgeon: Jettie Booze, MD;  Location: [place] [facility];  Service:  Cardiovascular;;   CARDIAC CATHETERIZATION N/A 10/07/2015   Procedure: Left Heart Cath and Cors/Grafts Angiography;  Surgeon: Burnell Blanks, MD;  Location: [place] [facility];  Service: Cardiovascular;  Laterality: N/A;   CARDIAC CATHETERIZATION N/A 10/07/2015   Procedure: Coronary Stent Intervention;  Surgeon: Burnell Blanks, MD;  Location: [place] [facility];  Service: Cardiovascular;  Laterality: N/A;   CATARACT EXTRACTION W/PHACO Left 10/21/2021   Procedure: CATARACT EXTRACTION PHACO AND INTRAOCULAR LENS PLACEMENT (IOC) LEFT 3.94 00:33.7;  Surgeon: Eulogio Bear, MD;  Location: [place];  Service: Ophthalmology;  Laterality: Left;   CATARACT EXTRACTION W/PHACO Right 11/09/2021   Procedure: CATARACT EXTRACTION PHACO AND INTRAOCULAR LENS PLACEMENT (IOC) RIGHT;  Surgeon: Eulogio Bear, MD;  Location: [place];  Service: Ophthalmology;  Laterality: Right;  3.59 0:29.4   CORONARY ANGIOPLASTY  "several"   CORONARY ANGIOPLASTY WITH STENT PLACEMENT  2005; 12/13/2014   "2; 1"   CORONARY ARTERY BYPASS GRAFT  1996   CABG X5   CORONARY ARTERY BYPASS GRAFT  March 2007   CABG X3   ESOPHAGOGASTRODUODENOSCOPY (EGD) WITH ESOPHAGEAL DILATION  2000   GREEN LIGHT LASER TURP (TRANSURETHRAL RESECTION OF PROSTATE  2000's   "not cancerous"   HERNIA REPAIR     LAPAROSCOPIC CHOLECYSTECTOMY     LEFT HEART CATH AND CORS/GRAFTS ANGIOGRAPHY N/A 07/28/2017   Procedure: LEFT HEART CATH AND CORS/GRAFTS ANGIOGRAPHY;  Surgeon: Troy Sine, MD;  Location: [place] [facility];  Service: Cardiovascular;  Laterality: N/A;   LUNG SURGERY  1996   "S/P CABG, had to put staple in lung after it had collapsed"   UMBILICAL HERNIA REPAIR     w/chole    Current Outpatient Medications  Medication Sig Dispense Refill   ACETAMINOPHEN PO Take 650 mg by mouth. 2 at bedtime     allopurinol (ZYLOPRIM) 100 MG tablet TAKE 1 TABLET BY MOUTH EVERYDAY AT BEDTIME 90 tablet 3   amiodarone  (PACERONE) 200 MG tablet TAKE 1 TABLET BY MOUTH  DAILY 90 tablet 2   amLODipine (NORVASC) 10 MG tablet TAKE 1 TABLET BY MOUTH EVERY DAY 90 tablet 1   apixaban (ELIQUIS) 5 MG TABS tablet Take 1 tablet (5 mg total) by mouth 2 (two) times daily. 180 tablet 1   atorvastatin (LIPITOR) 40 MG tablet TAKE 1 TABLET BY MOUTH EVERY DAY 90 tablet 2   Cholecalciferol (VITAMIN D-3) 125 MCG (5000 UT) TABS Take 2,000 Units by mouth daily.      furosemide (LASIX) 40 MG tablet Take 40 mg by mouth daily as needed for fluid or edema.   6   gabapentin (NEURONTIN) 100 MG capsule Take 1 capsule (100 mg total) by mouth 3 (three) times daily. 90 capsule 3   irbesartan (AVAPRO) 150 MG tablet Take 1 tablet (150 mg total) by mouth daily. 30 tablet 11   isosorbide mononitrate (IMDUR) 30 MG 24 hr tablet TAKE  1 TABLET BY MOUTH  DAILY 90 tablet 0   levothyroxine (SYNTHROID) 75 MCG tablet TAKE 1 TABLET BY MOUTH DAILY BEFORE BREAKFAST. 90 tablet 2   loratadine (CLARITIN) 10 MG tablet Take 10 mg by mouth daily as needed for allergies.     magnesium oxide (MAG-OX) 400 (240 Mg) MG tablet Take 1 tablet (400 mg total) by mouth 2 (two) times daily. 180 tablet 1   metoprolol tartrate (LOPRESSOR) 100 MG tablet TAKE 1 TABLET BY MOUTH  TWICE DAILY 180 tablet 2   Multiple Vitamin (MULTIVITAMIN PO) Take 1 tablet by mouth daily.     nitroGLYCERIN (NITROSTAT) 0.4 MG SL tablet Place 0.4 mg under the tongue every 5 (five) minutes as needed for chest pain (Up to 3 times).      pantoprazole (PROTONIX) 40 MG tablet TAKE 2 TABLETS BY MOUTH EVERY DAY 180 tablet 1   polyethylene glycol (MIRALAX / GLYCOLAX) 17 g packet Take 17 g by mouth daily.     sodium bicarbonate 650 MG tablet Take 650 mg by mouth 2 (two) times daily. Evening     No current facility-administered medications for this visit.    Allergies  Allergen Reactions   Predicort [Prednisolone] Other (See Comments)    Stomach pain   Ciprofloxacin     GI upset   Hydrochlorothiazide Other  (See Comments)    Dehydration   Hydrocodone Nausea Only    Stomach upset   Hydrocodone-Acetaminophen Nausea Only    Stomach upset   Sulfa Antibiotics Other (See Comments)    Cannot recall   Sulfacetamide Sodium Other (See Comments)    Cannot recall   Sulfasalazine Other (See Comments)    Cannot recall   Penicillins Hives and Rash    Has patient had a PCN reaction causing immediate rash, facial/tongue/throat swelling, SOB or lightheadedness with hypotension: YES Has patient had a PCN reaction causing severe rash involving mucus membranes or skin necrosis: NO Has patient had a PCN reaction that required hospitalization NO Has patient had a PCN reaction occurring within the last 10 years:NO If all of the above answers are "NO", then may proceed with Cephalosporin use.    Social History   Socioeconomic History   Marital status: Married    Spouse name: Not on file   Number of children: 2   Years of education: Not on file   Highest education level: 8th grade  Occupational History   Occupation: Retired  Tobacco Use   Smoking status: Former    Packs/day: 3.00    Years: 20.00    Total pack years: 60.00    Types: Cigarettes    Quit date: 07/25/1986    Years since quitting: 35.4   Smokeless tobacco: Never  Vaping Use   Vaping Use: Never used  Substance and Sexual Activity   Alcohol use: No    Alcohol/week: 0.0 standard drinks of alcohol   Drug use: No   Sexual activity: Not Currently  Other Topics Concern   Not on file  Social History Narrative   Did auto salvage work.   Lives at home with his wife.  Independent at baseline.   Social Determinants of Health   Financial Resource Strain: Low Risk  (10/28/2021)   Overall Financial Resource Strain (CARDIA)    Difficulty of Paying Living Expenses: Not hard at all  Food Insecurity: No Food Insecurity (10/28/2021)   Hunger Vital Sign    Worried About Running Out of Food in the Last Year: Never true  Ran Out of Food in the Last  Year: Never true  Transportation Needs: No Transportation Needs (10/28/2021)   PRAPARE - Hydrologist (Medical): No    Lack of Transportation (Non-Medical): No  Physical Activity: Inactive (04/28/2020)   Exercise Vital Sign    Days of Exercise per Week: 0 days    Minutes of Exercise per Session: 0 min  Stress: No Stress Concern Present (10/28/2021)   [place]    Feeling of Stress : Not at all  Social Connections: Moderately Isolated (10/28/2021)   Social Connection and Isolation Panel [NHANES]    Frequency of Communication with Friends and Family: Twice a week    Frequency of Social Gatherings with Friends and Family: Twice a week    Attends Religious Services: Never    Marine scientist or Organizations: No    Attends Archivist Meetings: Never    Marital Status: Married  Human resources officer Violence: Not At Risk (10/28/2021)   Humiliation, Afraid, Rape, and Kick questionnaire    Fear of Current or Ex-Partner: No    Emotionally Abused: No    Physically Abused: No    Sexually Abused: No    Family History  Problem Relation Age of Onset   Heart attack Mother        MI   Stroke Mother    Heart disease Mother    Hypertension Mother    Hyperlipidemia Mother    Heart disease Father    Rheumatic fever Father    Colon cancer Neg Hx     Review of Systems:  As stated in the HPI and otherwise negative.   There were no vitals taken for this visit.  Physical Examination:  General: Well developed, well nourished, NAD  HEENT: OP clear, mucus membranes moist  SKIN: warm, dry. No rashes. Neuro: No focal deficits  Musculoskeletal: Muscle strength 5/5 all ext  Psychiatric: Mood and affect normal  Neck: No JVD, no carotid bruits, no thyromegaly, no lymphadenopathy.  Lungs:Clear bilaterally, no wheezes, rhonci, crackles Cardiovascular: Regular rate and rhythm. No murmurs, gallops  or rubs. Abdomen:Soft. Bowel sounds present. Non-tender.  Extremities: No lower extremity edema. Pulses are 2 + in the bilateral DP/PT.  Echo January 2020: Left ventricle: The cavity size was moderately dilated. Systolic    function was moderately to severely reduced. The estimated    ejection fraction was in the range of 30% to 35%. Diffuse    hypokinesis. The study is not technically sufficient to allow    evaluation of LV diastolic function.  - Mitral valve: There was mild regurgitation.  - Left atrium: The atrium was mildly dilated.  - Right ventricle: The cavity size was mildly dilated. Wall    thickness was normal. Systolic function was mildly reduced  Cardiac cath February 2019: Significant native CAD with total occlusion of the LAD after the takeoff of the first diagonal vessel; total occlusion of the proximal left circumflex coronary artery; and total occlusion of the proximal RCA with antegrade bridging collaterals.  Patent LIMA to LAD.  Patent Y vein graft from the 1996 surgery which supplies the diagonal vessel and distal circumflex marginal vessel.  There is diffuse narrowing of 50% in the midportion of the Y graft supplying the diagonal vessel and a patent distal stent extending to the ostium of the diagonal.  The Y graft supplying the distal marginal is free of significant disease and has mild luminal  irregularity. Patent SVG supplying the distal RCA from the 2007 surgery with a stent in the proximal portion of the graft with 30% narrowings in the proximal and mid segment.  LVEDP 18 mm  Diagnostic Diagram        EKG:  EKG is *** ordered today. The ekg ordered today demonstrates   Recent Labs: 08/07/2021: TSH 2.395 10/01/2021: Hemoglobin 12.6; Platelets 175 11/24/2021: ALT 28; BUN 58; Creatinine, Ser 3.66; Potassium 5.5; Sodium 141   Lipid Panel    Component Value Date/Time   CHOL 153 11/24/2021 1625   TRIG 54 11/24/2021 1625   HDL 60 11/24/2021 1625   CHOLHDL 2.6  11/24/2021 1625   CHOLHDL 4.5 12/13/2014 0423   VLDL 25 12/13/2014 0423   LDLCALC 82 11/24/2021 1625     Wt Readings from Last 3 Encounters:  11/24/21 235 lb (106.6 kg)  11/09/21 236 lb (107 kg)  10/28/21 233 lb (105.7 kg)     Other studies Reviewed: Additional studies/ records that were reviewed today include: . Review of the above records demonstrates:    Assessment and Plan:   1. CAD s/p CABG without angina: Cardiac cath February 2019 with 4 patent vein grafts. No chest pain suggestive of angina. Will continue statin, Imdur and beta blocker. He is off of ASA due to easy bruising. He is also on Eliquis.        2. Ischemic Cardiomyopathy: LVEF =35% by echo at [facility] January 2020. No Ace-inh or ARB due to CKD. Will continue beta blocker. *** ? Repeat echo now.    3. Hypertension: BP is well controlled. No changes   4. Hyperlipidemia: Lipids followed in primary care. LDL ***. Continue statin.   5. CKD: Followed by Nephrology. Ace-inh stopped due to renal insufficiency.    6. Chronic systolic CHF: Weight is stable. No volume overload on exam. Continue Lasix as needed.    7. PVCs/Bigeminy: No palpitations. Continue beta blocker   8. AAA: Slable at 3.1 cm by u/s April 2022.   *** Repeat now.    9. Atrial fibrillation, paroxysmal: Sinus today. Continue beta blocker, amiodarone and Eliquis. TSH and chest xray ok March 2023.    10. Sleep apnea: he is on CPAP.  Current medicines are reviewed at length with the patient today.  The patient does not have concerns regarding medicines.  The following changes have been made:  no change  Labs/ tests ordered today include:   No orders of the defined types were placed in this encounter.    Disposition:   FU with me in 12 months.    Signed, Lauree Chandler, MD 12/27/2021 9:26 AM    [address], [place], [place]  [address] Phone: [phone]; Fax: [phone]

## 2021-12-28 ENCOUNTER — Ambulatory Visit (INDEPENDENT_AMBULATORY_CARE_PROVIDER_SITE_OTHER): Payer: Medicare Other | Admitting: Cardiovascular Disease

## 2021-12-28 ENCOUNTER — Encounter: Payer: Self-pay | Admitting: Cardiovascular Disease

## 2021-12-28 VITALS — BP 132/84 | HR 55 | Ht 69.0 in | Wt 238.0 lb

## 2021-12-28 DIAGNOSIS — I10 Essential (primary) hypertension: Secondary | ICD-10-CM | POA: Diagnosis not present

## 2021-12-28 DIAGNOSIS — E7800 Pure hypercholesterolemia, unspecified: Secondary | ICD-10-CM | POA: Diagnosis not present

## 2021-12-28 DIAGNOSIS — I5042 Chronic combined systolic (congestive) and diastolic (congestive) heart failure: Secondary | ICD-10-CM | POA: Diagnosis not present

## 2021-12-28 DIAGNOSIS — I480 Paroxysmal atrial fibrillation: Secondary | ICD-10-CM

## 2021-12-28 DIAGNOSIS — I2510 Atherosclerotic heart disease of native coronary artery without angina pectoris: Secondary | ICD-10-CM | POA: Diagnosis not present

## 2021-12-28 DIAGNOSIS — I255 Ischemic cardiomyopathy: Secondary | ICD-10-CM

## 2021-12-28 NOTE — Patient Instructions (Signed)
Medication Instructions:  No changes *If you need a refill on your cardiac medications before your next appointment, please call your pharmacy*   Lab Work: none   Testing/Procedures: none   Follow-Up: At [facility], you and your health needs are our priority.  As part of our continuing mission to provide you with exceptional heart care, we have created designated Provider Care Teams.  These Care Teams include your primary Cardiologist (physician) and Advanced Practice Providers (APPs -  Physician Assistants and Nurse Practitioners) who all work together to provide you with the care you need, when you need it.  Your next appointment:   6 month(s)  The format for your next appointment:   In Person  Provider:   Lauree Chandler, MD     Important Information About Sugar

## 2021-12-30 ENCOUNTER — Telehealth: Payer: Self-pay

## 2021-12-30 NOTE — Progress Notes (Signed)
Chronic Care Management Pharmacy Assistant   Name: [name]  MRN: [id number] DOB: 1945/09/08  Reason for Encounter: Hypertension Disease State Call  Recent office visits:  None ID  Recent consult visits:  12/28/2021 Lauree Chandler, [facility] (Cardiology) for Follow-up- Changed: Furosemide 40 mg prn to 40 mg daily, follow-up in 6 months  Hospital visits:  None in previous 6 months  Medications: Outpatient Encounter Medications as of 12/30/2021  Medication Sig   ACETAMINOPHEN PO Take 650 mg by mouth. 2 at bedtime   allopurinol (ZYLOPRIM) 100 MG tablet TAKE 1 TABLET BY MOUTH EVERYDAY AT BEDTIME   amiodarone (PACERONE) 200 MG tablet TAKE 1 TABLET BY MOUTH  DAILY   amLODipine (NORVASC) 10 MG tablet TAKE 1 TABLET BY MOUTH EVERY DAY   apixaban (ELIQUIS) 5 MG TABS tablet Take 1 tablet (5 mg total) by mouth 2 (two) times daily.   atorvastatin (LIPITOR) 40 MG tablet TAKE 1 TABLET BY MOUTH EVERY DAY   Cholecalciferol (VITAMIN D-3) 125 MCG (5000 UT) TABS Take 2,000 Units by mouth daily.    furosemide (LASIX) 40 MG tablet Take 40 mg by mouth daily.   gabapentin (NEURONTIN) 100 MG capsule Take 1 capsule (100 mg total) by mouth 3 (three) times daily.   irbesartan (AVAPRO) 150 MG tablet Take 1 tablet (150 mg total) by mouth daily.   isosorbide mononitrate (IMDUR) 30 MG 24 hr tablet TAKE 1 TABLET BY MOUTH  DAILY   levothyroxine (SYNTHROID) 75 MCG tablet TAKE 1 TABLET BY MOUTH DAILY BEFORE BREAKFAST.   loratadine (CLARITIN) 10 MG tablet Take 10 mg by mouth daily as needed for allergies.   magnesium oxide (MAG-OX) 400 (240 Mg) MG tablet Take 1 tablet (400 mg total) by mouth 2 (two) times daily.   metoprolol tartrate (LOPRESSOR) 100 MG tablet TAKE 1 TABLET BY MOUTH  TWICE DAILY   Multiple Vitamin (MULTIVITAMIN PO) Take 1 tablet by mouth daily.   nitroGLYCERIN (NITROSTAT) 0.4 MG SL tablet Place 0.4 mg under the tongue every 5 (five) minutes as needed for chest pain (Up to 3 times).     pantoprazole (PROTONIX) 40 MG tablet TAKE 2 TABLETS BY MOUTH EVERY DAY   polyethylene glycol (MIRALAX / GLYCOLAX) 17 g packet Take 17 g by mouth daily.   sodium bicarbonate 650 MG tablet Take 650 mg by mouth 2 (two) times daily. Evening   No facility-administered encounter medications on file as of 12/30/2021.   Care Gaps: Zoster Vaccine Diabetic Foot Exam Influenza Vaccine BP> 140/90 patient f/u with Cardio on 07/31 and BP was 132/84  Star Rating Drugs: Atorvastatin 40 mg last filled on 11/21/2021 for a 90-Day supply with [facility] Irbesartan 150 mg last filled on 11/03/2021 for a 90-Day supply with [facility] Reviewed chart prior to disease state call. Spoke with patient regarding BP  Recent Office Vitals: BP Readings from Last 3 Encounters:  12/28/21 132/84  11/24/21 (!) 145/62  11/09/21 (!) 156/68   Pulse Readings from Last 3 Encounters:  12/28/21 (!) 55  11/24/21 69  11/09/21 (!) 50    Wt Readings from Last 3 Encounters:  12/28/21 238 lb (108 kg)  11/24/21 235 lb (106.6 kg)  11/09/21 236 lb (107 kg)    Kidney Function Lab Results  Component Value Date/Time   CREATININE 3.66 (H) 11/24/2021 04:25 PM   CREATININE 2.93 (H) 08/12/2021 01:51 PM   CREATININE 2.44 (H) 11/24/2015 03:46 PM   CREATININE 2.17 (H) 10/20/2015 02:18 PM   GFRNONAA 21 (L)  08/12/2021 01:51 PM   GFRAA 28 (L) 03/20/2020 10:38 AM      Latest Ref Rng & Units 11/24/2021    4:25 PM 08/12/2021    1:51 PM 08/07/2021    3:36 PM  BMP  Glucose 70 - 99 mg/dL 84  139  100   BUN 8 - 27 mg/dL 58  38  45   Creatinine 0.76 - 1.27 mg/dL 3.66  2.93  3.47   BUN/Creat Ratio 10 - 24 16     Sodium 134 - 144 mmol/L 141  139  143   Potassium 3.5 - 5.2 mmol/L 5.5  4.8  5.2   Chloride 96 - 106 mmol/L 103  108  109   CO2 20 - 29 mmol/L '22  25  25   '$ Calcium 8.6 - 10.2 mg/dL 9.7  9.1  9.3    Current antihypertensive regimen:  Amlodipine 10 mg daily  Enalapril 20 mg daily  Furosemide 40 mg daily  Imdur 30 mg daily   Metoprolol 100 mg twice daily   How often are you checking your Blood Pressure?  Per spouse she doesn't really take BP at home  Current home BP readings: Per spouse the last BP taken was at Cardio she hasn't taken it since  What recent interventions/DTPs have been made by any provider to improve Blood Pressure control since last CPP Visit: Cardio changed Furosemide to 40 mg daily  Any recent hospitalizations or ED visits since last visit with CPP? No  What diet changes have been made to improve Blood Pressure Control?  No new changes to his diet  What exercise is being done to improve your Blood Pressure Control?  Patient doesn't exercise due to illnessess  Adherence Review: Is the patient currently on ACE/ARB medication? Yes  I spoke with the patient's spouse and she reports that the patient is doing well today. She stated that he is now taking the Furosemide daily and this morning his legs and feet aren't swollen. She stated he has had to take 2 of them but only once. The spouse denies any ill symptoms with the patient today. There are no concerns or issues at this time.   Patient has telephone follow-up with Junius Argyle, CPP on 06/07/2021 @ [place], CPA/CMA Catering manager Phone: [phone]

## 2021-12-31 ENCOUNTER — Other Ambulatory Visit: Payer: Self-pay | Admitting: Family Medicine

## 2022-01-01 NOTE — Telephone Encounter (Signed)
Requested Prescriptions  Pending Prescriptions Disp Refills  . amLODipine (NORVASC) 10 MG tablet [Pharmacy Med Name: AMLODIPINE BESYLATE 10 MG TAB] 90 tablet 1    Sig: TAKE 1 TABLET BY MOUTH EVERY DAY     Cardiovascular: Calcium Channel Blockers 2 Passed - 12/31/2021  5:05 PM      Passed - Last BP in normal range    BP Readings from Last 1 Encounters:  12/28/21 132/84         Passed - Last Heart Rate in normal range    Pulse Readings from Last 1 Encounters:  12/28/21 (!) 55         Passed - Valid encounter within last 6 months    Recent Outpatient Visits          1 month ago Diabetes mellitus with nephropathy [facility])   [facility] [name]., MD   5 months ago Diabetes mellitus with nephropathy [facility])   [facility] [name]., MD   6 months ago Viral URI with cough   [facility] Virginia Crews, MD   9 months ago Dilated cardiomyopathy [facility])   [facility] [name]., MD   10 months ago Pneumonitis   [facility] [name]., MD      Future Appointments            In 2 months [name]., MD [facility], [place]

## 2022-01-08 ENCOUNTER — Other Ambulatory Visit: Payer: Self-pay | Admitting: Cardiovascular Disease

## 2022-01-08 ENCOUNTER — Telehealth: Payer: Self-pay | Admitting: Family Medicine

## 2022-01-08 DIAGNOSIS — E039 Hypothyroidism, unspecified: Secondary | ICD-10-CM

## 2022-01-08 NOTE — Telephone Encounter (Signed)
pharmacy faxed refill request for the following medications:  atorvastatin (LIPITOR) 40 MG tablet   Please advise

## 2022-01-08 NOTE — Telephone Encounter (Signed)
pharmacy faxed refill request for the following medications:  allopurinol (ZYLOPRIM) 100 MG tablet   levothyroxine (SYNTHROID) 75 MCG tablet   Please advise

## 2022-01-11 ENCOUNTER — Other Ambulatory Visit: Payer: Self-pay

## 2022-01-11 MED ORDER — ATORVASTATIN CALCIUM 40 MG PO TABS: 40.0000 mg | ORAL_TABLET | Freq: Every day | ORAL | 2 refills | Status: DC

## 2022-01-11 MED ORDER — ALLOPURINOL 100 MG PO TABS: ORAL_TABLET | ORAL | 3 refills | Status: DC

## 2022-01-11 MED ORDER — LEVOTHYROXINE SODIUM 75 MCG PO TABS: 75.0000 ug | ORAL_TABLET | Freq: Every day | ORAL | 2 refills | Status: DC

## 2022-01-18 ENCOUNTER — Other Ambulatory Visit: Payer: Self-pay | Admitting: *Deleted

## 2022-01-18 DIAGNOSIS — I10 Essential (primary) hypertension: Secondary | ICD-10-CM

## 2022-01-18 MED ORDER — AMLODIPINE BESYLATE 10 MG PO TABS: 10.0000 mg | ORAL_TABLET | Freq: Every day | ORAL | 3 refills | Status: DC

## 2022-01-19 ENCOUNTER — Ambulatory Visit: Payer: Self-pay

## 2022-01-19 NOTE — Patient Instructions (Signed)
Visit Information  Thank you for taking time to visit with me today. Please don't hesitate to contact me if I can be of assistance to you.   Following are the goals we discussed today:   Goals Addressed             This Visit's Progress    COMPLETED: RNCM: "He works with the pharmacist already"       Care Coordination Interventions: Evaluation of current treatment plan related to chronic conditions and needs  of the patient and patient's adherence to plan as established by provider Advised patient to call the [facility] for new needs or concerns. The patients wife states that the patient already works with the pharm D and does not feel that they need outreaches from the [facility]. Education provided for how to reach the [facility].  Provided education to patient re: role of the RNCM to assist with needs the patient may have that the [facility] can assist with Pharmacy referral for the patient currently working with the pharm D and states needs are being met at this time.              Please call the care guide team at [phone] if you need to schedule an appointment.   If you are experiencing a Mental Health or [place] or need someone to talk to, please call the Suicide and Crisis Lifeline: 988 call the [place] National Suicide Prevention Lifeline: 831 497 6711 or TTY: (713) 601-5523 TTY 930 663 6371) to talk to a trained counselor call 1-800-273-TALK (toll free, 24 hour hotline)  Patient verbalizes understanding of instructions and care plan provided today and agrees to view in [place]. Active MyChart status and patient understanding of how to access instructions and care plan via MyChart confirmed with patient.     No further follow up required: Per the patients wife, he is already working with the pharm D and does not need further outreaches from the [facility]. Education on calling the [facility] in the future for new needs.   Noreene Larsson RN, MSN, CCM Community Care Coordinator [place] [facility] Mobile: [phone]

## 2022-01-19 NOTE — Patient Outreach (Signed)
  Care Coordination   Initial Visit Note   01/19/2022 Name: Ronald [name] [facility] MRN: [id number] DOB: August 22, 1945  Ronald [name] is a 76 y.o. year old male who sees [name]., MD for primary care. I  spoke with the patients wife, Ronald [name]  What matters to the patients health and wellness today?  The patient is already working with the pharm D and denies any needs that the [facility] can assist with today.     Goals Addressed             This Visit's Progress    COMPLETED: RNCM: "He works with the pharmacist already"       Care Coordination Interventions: Evaluation of current treatment plan related to chronic conditions and needs  of the patient and patient's adherence to plan as established by provider Advised patient to call the [facility] for new needs or concerns. The patients wife states that the patient already works with the pharm D and does not feel that they need outreaches from the [facility]. Education provided for how to reach the [facility].  Provided education to patient re: role of the RNCM to assist with needs the patient may have that the [facility] can assist with Pharmacy referral for the patient currently working with the pharm D and states needs are being met at this time.            SDOH assessments and interventions completed:  No     Care Coordination Interventions Activated:  No  Care Coordination Interventions:  No, not indicated   Follow up plan: No further intervention required.   Encounter Outcome:  Pt. Visit Completed   Noreene Larsson RN, MSN, [place] [facility] Mobile: [phone]

## 2022-01-22 DIAGNOSIS — Z7901 Long term (current) use of anticoagulants: Secondary | ICD-10-CM | POA: Diagnosis not present

## 2022-01-22 DIAGNOSIS — E1121 Type 2 diabetes mellitus with diabetic nephropathy: Secondary | ICD-10-CM | POA: Insufficient documentation

## 2022-01-22 DIAGNOSIS — Z23 Encounter for immunization: Secondary | ICD-10-CM | POA: Diagnosis not present

## 2022-01-22 DIAGNOSIS — S41111A Laceration without foreign body of right upper arm, initial encounter: Secondary | ICD-10-CM | POA: Diagnosis not present

## 2022-01-22 DIAGNOSIS — W108XXA Fall (on) (from) other stairs and steps, initial encounter: Secondary | ICD-10-CM | POA: Diagnosis not present

## 2022-02-09 DIAGNOSIS — R600 Localized edema: Secondary | ICD-10-CM | POA: Diagnosis not present

## 2022-02-09 DIAGNOSIS — N2581 Secondary hyperparathyroidism of renal origin: Secondary | ICD-10-CM | POA: Diagnosis not present

## 2022-02-09 DIAGNOSIS — N184 Chronic kidney disease, stage 4 (severe): Secondary | ICD-10-CM | POA: Diagnosis not present

## 2022-02-09 DIAGNOSIS — I10 Essential (primary) hypertension: Secondary | ICD-10-CM | POA: Diagnosis not present

## 2022-02-09 DIAGNOSIS — R801 Persistent proteinuria, unspecified: Secondary | ICD-10-CM | POA: Diagnosis not present

## 2022-02-12 ENCOUNTER — Encounter: Payer: Self-pay | Admitting: Physician Assistant

## 2022-02-12 ENCOUNTER — Ambulatory Visit (INDEPENDENT_AMBULATORY_CARE_PROVIDER_SITE_OTHER): Payer: Medicare Other | Admitting: Physician Assistant

## 2022-02-12 VITALS — BP 146/66 | HR 55 | Temp 97.9°F

## 2022-02-12 DIAGNOSIS — U071 COVID-19: Secondary | ICD-10-CM

## 2022-02-12 NOTE — Progress Notes (Unsigned)
I,Roshena L Chambers,acting as a Education administrator for [employer], PA-C.,have documented all relevant documentation on the behalf of Mardene Speak, PA-C,as directed by  [employer], PA-C while in the presence of [employer], PA-C.   Established patient visit   Patient: Ronald [name] [facility]   DOB: January 01, 1946   76 y.o. Male  MRN: [id number] Visit Date: 02/12/2022  Today's healthcare provider: Mardene Speak, PA-C   Chief Complaint  Patient presents with   Cough   Subjective    Cough This is a new problem. Episode onset: 6 days ago. The problem has been unchanged. The cough is Productive of sputum. Associated symptoms include headaches, postnasal drip, rhinorrhea and shortness of breath. Pertinent negatives include no chest pain, chills, fever or wheezing. Treatments tried: Tylenol. The treatment provided mild relief.    Patient tested positive for COVID 6 days ago? Patient states his son tested positive for COVID 4 days ago, and his wife tested positive for COVID yesterday.   Medications: Outpatient Medications Prior to Visit  Medication Sig   ACETAMINOPHEN PO Take 650 mg by mouth. 2 at bedtime   allopurinol (ZYLOPRIM) 100 MG tablet TAKE 1 TABLET BY MOUTH EVERYDAY AT BEDTIME   amiodarone (PACERONE) 200 MG tablet TAKE 1 TABLET BY MOUTH DAILY   amLODipine (NORVASC) 10 MG tablet Take 1 tablet (10 mg total) by mouth daily.   apixaban (ELIQUIS) 5 MG TABS tablet Take 1 tablet (5 mg total) by mouth 2 (two) times daily.   atorvastatin (LIPITOR) 40 MG tablet Take 1 tablet (40 mg total) by mouth daily.   Cholecalciferol (VITAMIN D-3) 125 MCG (5000 UT) TABS Take 2,000 Units by mouth daily.    furosemide (LASIX) 40 MG tablet Take 40 mg by mouth daily.   irbesartan (AVAPRO) 150 MG tablet Take 1 tablet (150 mg total) by mouth daily.   isosorbide mononitrate (IMDUR) 30 MG 24 hr tablet TAKE 1 TABLET BY MOUTH DAILY   levothyroxine (SYNTHROID) 75 MCG tablet Take 1 tablet (75 mcg total) by mouth daily before  breakfast.   loratadine (CLARITIN) 10 MG tablet Take 10 mg by mouth daily as needed for allergies.   magnesium oxide (MAG-OX) 400 (240 Mg) MG tablet Take 1 tablet (400 mg total) by mouth 2 (two) times daily.   metoprolol tartrate (LOPRESSOR) 100 MG tablet TAKE 1 TABLET BY MOUTH TWICE  DAILY   Multiple Vitamin (MULTIVITAMIN PO) Take 1 tablet by mouth daily.   nitroGLYCERIN (NITROSTAT) 0.4 MG SL tablet Place 0.4 mg under the tongue every 5 (five) minutes as needed for chest pain (Up to 3 times).    pantoprazole (PROTONIX) 40 MG tablet TAKE 2 TABLETS BY MOUTH EVERY DAY   polyethylene glycol (MIRALAX / GLYCOLAX) 17 g packet Take 17 g by mouth daily.   sodium bicarbonate 650 MG tablet Take 650 mg by mouth 2 (two) times daily. Evening   gabapentin (NEURONTIN) 100 MG capsule Take 1 capsule (100 mg total) by mouth 3 (three) times daily.   No facility-administered medications prior to visit.    Review of Systems  Constitutional:  Negative for appetite change, chills and fever.  HENT:  Positive for congestion, postnasal drip and rhinorrhea.   Respiratory:  Positive for cough and shortness of breath. Negative for chest tightness and wheezing.   Cardiovascular:  Negative for chest pain and palpitations.  Gastrointestinal:  Negative for abdominal pain, nausea and vomiting.  Neurological:  Positive for headaches.       Objective  BP (!) 146/66   Pulse (!) 55   Temp 97.9 F (36.6 C) (Oral)   SpO2 99% Comment: room air   Physical Exam Vitals reviewed.  Constitutional:      General: He is in acute distress.     Appearance: Normal appearance. He is not ill-appearing, toxic-appearing or diaphoretic.  HENT:     Head: Normocephalic and atraumatic.     Right Ear: There is impacted cerumen.     Left Ear: Tympanic membrane, ear canal and external ear normal.     Nose: Congestion and rhinorrhea present.     Mouth/Throat:     Pharynx: Posterior oropharyngeal erythema present.     Comments:  Postnasal drainage Eyes:     General: No scleral icterus.       Right eye: No discharge.        Left eye: No discharge.     Extraocular Movements: Extraocular movements intact.     Conjunctiva/sclera: Conjunctivae normal.     Pupils: Pupils are equal, round, and reactive to light.  Cardiovascular:     Rate and Rhythm: Normal rate and regular rhythm.     Pulses: Normal pulses.     Heart sounds: Normal heart sounds. No murmur heard. Pulmonary:     Effort: Pulmonary effort is normal. No respiratory distress.     Breath sounds: Normal breath sounds. No wheezing or rhonchi.  Abdominal:     General: Abdomen is flat. Bowel sounds are normal.     Palpations: Abdomen is soft.  Musculoskeletal:        General: Normal range of motion.     Cervical back: Normal range of motion and neck supple.     Right lower leg: No edema.     Left lower leg: No edema.  Lymphadenopathy:     Cervical: No cervical adenopathy.  Skin:    General: Skin is warm and dry.     Findings: No rash.  Neurological:     General: No focal deficit present.     Mental Status: He is alert and oriented to person, place, and time. Mental status is at baseline.  Psychiatric:        Behavior: Behavior normal.        Thought Content: Thought content normal.       No results found for any visits on 02/12/22.  Assessment & Plan     1. COVID-19 virus infection X 6 day. Improving Recommended to continue symptomatic treatment over-the-counter antipyretics, analgesics, or antitussives for fever, headache, myalgias, and cough. Drink a lot of fluids. Rest.  FU PRN    The patient was advised to call back or seek an in-person evaluation if the symptoms worsen or if the condition fails to improve as anticipated.  I discussed the assessment and treatment plan with the patient. The patient was provided an opportunity to ask questions and all were answered. The patient agreed with the plan and demonstrated an understanding of the  instructions.  The entirety of the information documented in the History of Present Illness, Review of Systems and Physical Exam were personally obtained by me. Portions of this information were initially documented by the CMA and reviewed by me for thoroughness and accuracy.  Portions of this note were created using dictation software and may contain typographical errors.        Total encounter time more than 20 minutes  Greater than 50% was spent in counseling and coordination of care with the patient   [place]  [place], [place] [phone] (phone) [phone] (fax)  [place]

## 2022-02-23 DIAGNOSIS — N2581 Secondary hyperparathyroidism of renal origin: Secondary | ICD-10-CM | POA: Diagnosis not present

## 2022-02-23 DIAGNOSIS — R801 Persistent proteinuria, unspecified: Secondary | ICD-10-CM | POA: Diagnosis not present

## 2022-02-23 DIAGNOSIS — I701 Atherosclerosis of renal artery: Secondary | ICD-10-CM | POA: Diagnosis not present

## 2022-02-23 DIAGNOSIS — R600 Localized edema: Secondary | ICD-10-CM | POA: Diagnosis not present

## 2022-02-23 DIAGNOSIS — N184 Chronic kidney disease, stage 4 (severe): Secondary | ICD-10-CM | POA: Diagnosis not present

## 2022-02-23 DIAGNOSIS — I129 Hypertensive chronic kidney disease with stage 1 through stage 4 chronic kidney disease, or unspecified chronic kidney disease: Secondary | ICD-10-CM | POA: Diagnosis not present

## 2022-03-01 ENCOUNTER — Ambulatory Visit: Payer: Medicare Other | Admitting: Cardiovascular Disease

## 2022-03-05 ENCOUNTER — Ambulatory Visit: Payer: Medicare Other | Admitting: Cardiovascular Disease

## 2022-03-10 ENCOUNTER — Ambulatory Visit: Payer: Medicare Other | Admitting: Cardiovascular Disease

## 2022-03-30 ENCOUNTER — Ambulatory Visit: Payer: Medicare Other | Admitting: Family Medicine

## 2022-04-06 ENCOUNTER — Ambulatory Visit
Admission: RE | Admit: 2022-04-06 | Discharge: 2022-04-06 | Disposition: A | Discharge status: Home / Self Care | Payer: Medicare Other | Source: Ambulatory Visit | Attending: Internal Medicine | Admitting: Internal Medicine

## 2022-04-06 ENCOUNTER — Ambulatory Visit (INDEPENDENT_AMBULATORY_CARE_PROVIDER_SITE_OTHER): Payer: Medicare Other | Admitting: Internal Medicine

## 2022-04-06 ENCOUNTER — Ambulatory Visit
Admission: RE | Admit: 2022-04-06 | Discharge: 2022-04-06 | Disposition: A | Discharge status: Home / Self Care | Payer: Medicare Other | Attending: Internal Medicine | Admitting: Internal Medicine

## 2022-04-06 ENCOUNTER — Encounter: Payer: Self-pay | Admitting: Internal Medicine

## 2022-04-06 VITALS — BP 122/78 | HR 104 | Ht 69.0 in | Wt 241.0 lb

## 2022-04-06 DIAGNOSIS — M25561 Pain in right knee: Secondary | ICD-10-CM | POA: Insufficient documentation

## 2022-04-06 DIAGNOSIS — R29898 Other symptoms and signs involving the musculoskeletal system: Secondary | ICD-10-CM | POA: Diagnosis not present

## 2022-04-06 DIAGNOSIS — I480 Paroxysmal atrial fibrillation: Secondary | ICD-10-CM

## 2022-04-06 DIAGNOSIS — R0609 Other forms of dyspnea: Secondary | ICD-10-CM

## 2022-04-06 DIAGNOSIS — M48061 Spinal stenosis, lumbar region without neurogenic claudication: Secondary | ICD-10-CM | POA: Diagnosis not present

## 2022-04-06 DIAGNOSIS — Z23 Encounter for immunization: Secondary | ICD-10-CM | POA: Diagnosis not present

## 2022-04-06 DIAGNOSIS — N2581 Secondary hyperparathyroidism of renal origin: Secondary | ICD-10-CM

## 2022-04-06 DIAGNOSIS — I714 Abdominal aortic aneurysm, without rupture, unspecified: Secondary | ICD-10-CM

## 2022-04-06 DIAGNOSIS — I25110 Atherosclerotic heart disease of native coronary artery with unstable angina pectoris: Secondary | ICD-10-CM | POA: Diagnosis not present

## 2022-04-06 DIAGNOSIS — R7303 Prediabetes: Secondary | ICD-10-CM | POA: Insufficient documentation

## 2022-04-06 NOTE — Progress Notes (Signed)
Date:  04/06/2022   Name:  Ronald [name]   DOB:  [birth year]   MRN:  [id number]   Chief Complaint: Establish Care, Memory Loss (Short term memory ), and Back Pain  Back Pain This is a chronic problem. Episode onset: X6 years. The problem has been gradually worsening since onset. The quality of the pain is described as aching. The pain does not radiate. The pain is at a severity of 7/10. The pain is mild. The symptoms are aggravated by bending, twisting, coughing, sitting and standing. Stiffness is present All day. Pertinent negatives include no chest pain, fever or headaches. He has tried heat (voltaren.  saw Chasnis and had one ESI but did not return) for the symptoms. The treatment provided mild relief.  Knee Pain  The incident occurred 3 to 5 days ago. [place] The injury mechanism was a direct blow. The pain is present in the right knee. The quality of the pain is described as aching and burning. The pain is moderate.  Diabetes He presents for his follow-up diabetic visit. Diabetes type: prediabetes. His disease course has been stable. Hypoglycemia symptoms include nervousness/anxiousness. Pertinent negatives for hypoglycemia include no dizziness or headaches. Pertinent negatives for diabetes include no chest pain. Current diabetic treatment includes diet.  Falls - he has had several falls recently.  He tripped once but the other times he simply lost his balance while going down bleacher and another time while carrying drinks at the [place].  His legs feel weak and he has limited exercise endurance.  He denies claudications/heaviness. CAD - long history of CAD with CABG x 2 and stents x 2.  He has shortness of breath with normal pulmonary evaluation and normal CXR.  Lab Results  Component Value Date   NA 141 11/24/2021   K 5.5 (H) 11/24/2021   CO2 22 11/24/2021   GLUCOSE 84 11/24/2021   BUN 58 (H) 11/24/2021   CREATININE 3.66 (H) 11/24/2021   CALCIUM 9.7  11/24/2021   EGFR 16 (L) 11/24/2021   GFRNONAA 21 (L) 08/12/2021   Lab Results  Component Value Date   CHOL 153 11/24/2021   HDL 60 11/24/2021   LDLCALC 82 11/24/2021   TRIG 54 11/24/2021   CHOLHDL 2.6 11/24/2021   Lab Results  Component Value Date   TSH 2.395 08/07/2021   Lab Results  Component Value Date   HGBA1C 5.7 (H) 11/24/2021   Lab Results  Component Value Date   WBC 5.9 10/01/2021   HGB 12.6 (L) 10/01/2021   HCT 38.9 (L) 10/01/2021   MCV 89.6 10/01/2021   PLT 175 10/01/2021   Lab Results  Component Value Date   ALT 28 11/24/2021   AST 26 11/24/2021   ALKPHOS 155 (H) 11/24/2021   BILITOT 0.9 11/24/2021   No results found for: "25OHVITD2", "25OHVITD3", "VD25OH"   Review of Systems  Constitutional:  Negative for chills, diaphoresis, fever and unexpected weight change.  HENT:  Negative for trouble swallowing.   Respiratory:  Positive for shortness of breath. Negative for chest tightness and wheezing.   Cardiovascular:  Positive for leg swelling. Negative for chest pain and palpitations.  Musculoskeletal:  Positive for back pain, gait problem and neck stiffness.  Skin:  Positive for wound (skin tears on right anterior ankle and left wrist).  Neurological:  Negative for dizziness and headaches.  Psychiatric/Behavioral:  Positive for dysphoric mood and sleep disturbance. Negative for suicidal ideas. The patient is nervous/anxious.  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/2015   Coronary artery disease involving native coronary artery of native heart with unstable angina pectoris (HCC)    Allergic rhinitis 08/19/2015   NSTEMI (non-ST elevated myocardial infarction) ([place]) 12/13/2014   AA (aortic aneurysm) ([place]) 10/04/2014   Arteriosclerosis of coronary artery 10/04/2014   Diabetes mellitus, type 2 ([place]) 10/04/2014   Acid reflux 10/04/2014   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   HLD (hyperlipidemia) 10/04/2014   BP (high blood pressure) 10/04/2014   Osteoarthrosis 10/04/2014   Adult BMI 30+ 10/04/2014   Basal cell papilloma 10/04/2014   Hypertensive heart disease without CHF 12/30/2012   Genital candidiasis in male 10/25/2012   Urge incontinence of urine 10/24/2012   Benign prostatic hyperplasia with urinary obstruction 10/24/2012   Incomplete emptying of bladder 10/24/2012   Other obstructive and reflux uropathy 10/24/2012   Benign prostatic hyperplasia with lower urinary tract symptoms 10/24/2012   Abdominal pain 11/09/2010   DOE (dyspnea on exertion) 11/12/2009   Renal artery stenosis     Mixed hyperlipidemia 09/04/2008   History of redo bypass grafting 09/04/2008   Diabetes mellitus with nephropathy (HCC)     Allergies  Allergen Reactions   Predicort [Prednisolone] Other (See Comments)    Stomach pain   Ciprofloxacin Other (See Comments)    GI upset   Hydrochlorothiazide Other (See Comments)    Dehydration  Other reaction(s): Other (See Comments) dehydrates Dehydration   Hydrocodone Nausea Only and Other (See Comments)    Stomach upset  Stomach upset    Stomach upset Stomach upset   Hydrocodone-Acetaminophen Nausea Only    Stomach upset   Sulfa Antibiotics Other (See Comments)    Cannot recall   Sulfacetamide Sodium Other (See Comments)    Cannot recall   Sulfasalazine Other (See Comments)    Cannot recall   Hydrocodone-Acetaminophen Nausea Only and Other (See Comments)    Stomach upset    Penicillins Hives, Rash and Other (See Comments)    Has patient had a PCN reaction causing immediate rash, facial/tongue/throat swelling, SOB or lightheadedness with hypotension: YES  Has patient had a PCN reaction causing severe rash involving mucus membranes or skin necrosis: NO  Has patient had a PCN reaction that required hospitalization NO  Has patient had a PCN reaction occurring within the last 10 years:NO  If all of the above answers are "NO", then may proceed with Cephalosporin use.  Has patient had a PCN reaction causing immediate rash, facial/tongue/throat swelling, SOB or lightheadedness with hypotension: YES Has patient had a PCN reaction causing severe rash involving mucus membranes or skin necrosis: NO Has patient had a PCN reaction that required hospitalization NO Has patient had a PCN reaction occurring within the last 10 years:NO If all of the above answers are "NO", then may proceed with Cephalosporin use.    Past Surgical History:  Procedure Laterality Date   CARDIAC CATHETERIZATION  "several"   CARDIAC CATHETERIZATION N/A 12/13/2014   Procedure: Left Heart Cath  and Coronary Angiography;  Surgeon: Jettie Booze, MD;  Location: [facility];  Service: Cardiovascular;  Laterality: N/A;   CARDIAC CATHETERIZATION  12/13/2014   Procedure: Coronary Stent Intervention;  Surgeon: Jettie Booze, MD;  Location: [facility];  Service: Cardiovascular;;   CARDIAC CATHETERIZATION N/A 10/07/2015   Procedure: Left Heart Cath and Cors/Grafts Angiography;  Surgeon: Burnell Blanks, MD;  Location: [facility];  Service: Cardiovascular;  Laterality: N/A;   CARDIAC CATHETERIZATION N/A 10/07/2015   Procedure: Coronary Stent Intervention;  Surgeon: Burnell Blanks, MD;  Location: [facility];  Service: Cardiovascular;  Laterality: N/A;   CATARACT EXTRACTION W/PHACO Left 10/21/2021   Procedure: CATARACT EXTRACTION PHACO AND INTRAOCULAR LENS  PLACEMENT (IOC) LEFT 3.94 00:33.7;  Surgeon: Eulogio Bear, MD;  Location: [facility];  Service: Ophthalmology;  Laterality: Left;   CATARACT EXTRACTION W/PHACO Right 11/09/2021   Procedure: CATARACT EXTRACTION PHACO AND INTRAOCULAR LENS PLACEMENT (IOC) RIGHT;  Surgeon: Eulogio Bear, MD;  Location: [facility];  Service: Ophthalmology;  Laterality: Right;  3.59 0:29.4   CORONARY ANGIOPLASTY  "several"   CORONARY ANGIOPLASTY WITH STENT PLACEMENT  2005; 12/13/2014   "2; 1"   CORONARY ARTERY BYPASS GRAFT  1996   CABG X5   CORONARY ARTERY BYPASS GRAFT  March 2007   CABG X3   ESOPHAGOGASTRODUODENOSCOPY (EGD) WITH ESOPHAGEAL DILATION  2000   GREEN LIGHT LASER TURP (TRANSURETHRAL RESECTION OF PROSTATE  2000's   "not cancerous"   HERNIA REPAIR     LAPAROSCOPIC CHOLECYSTECTOMY     LEFT HEART CATH AND CORS/GRAFTS ANGIOGRAPHY N/A 07/28/2017   Procedure: LEFT HEART CATH AND CORS/GRAFTS ANGIOGRAPHY;  Surgeon: Troy Sine, MD;  Location: [facility];  Service: Cardiovascular;  Laterality: N/A;   LUNG SURGERY  1996   "S/P CABG, had to put staple in lung after it had collapsed"   UMBILICAL HERNIA REPAIR     w/chole    Social History   Tobacco Use   Smoking status: Former    Packs/day: 3.00    Years: 20.00    Total pack years: 60.00    Types: Cigarettes    Quit date: 07/25/1986    Years since quitting: 35.7   Smokeless tobacco: Never  Vaping Use   Vaping Use: Never used  Substance Use Topics   Alcohol use: No    Alcohol/week: 0.0 standard drinks of alcohol   Drug use: No     Medication list has been reviewed and updated.  Current Meds  Medication Sig   ACETAMINOPHEN PO Take 650 mg by mouth as needed. 2 at bedtime   allopurinol (ZYLOPRIM) 100 MG tablet TAKE 1 TABLET BY MOUTH EVERYDAY AT BEDTIME   amiodarone (PACERONE) 200 MG tablet TAKE 1 TABLET BY MOUTH DAILY   amLODipine (NORVASC) 10 MG tablet Take 1 tablet (10 mg total) by mouth daily.    apixaban (ELIQUIS) 5 MG TABS tablet Take 1 tablet (5 mg total) by mouth 2 (two) times daily.   atorvastatin (LIPITOR) 40 MG tablet Take 1 tablet (40 mg total) by mouth daily.   Cholecalciferol (VITAMIN D-3) 125 MCG (5000 UT) TABS Take 2,000 Units by mouth daily.    furosemide (LASIX) 40 MG tablet Take 40 mg by mouth 2 (two) times daily.   irbesartan (AVAPRO) 150 MG tablet Take 1 tablet (150 mg total) by mouth daily.   isosorbide mononitrate (IMDUR) 30 MG 24 hr tablet TAKE 1 TABLET BY MOUTH  DAILY   levothyroxine (SYNTHROID) 75 MCG tablet Take 1 tablet (75 mcg total) by mouth daily before breakfast.   loratadine (CLARITIN) 10 MG tablet Take 10 mg by mouth daily as needed for allergies.   magnesium oxide (MAG-OX) 400 (240 Mg) MG tablet Take 1 tablet (400 mg total) by mouth 2 (two) times daily.   metoprolol tartrate (LOPRESSOR) 100 MG tablet TAKE 1 TABLET BY MOUTH TWICE  DAILY   Multiple Vitamin (MULTIVITAMIN PO) Take 1 tablet by mouth daily.   nitroGLYCERIN (NITROSTAT) 0.4 MG SL tablet Place 0.4 mg under the tongue every 5 (five) minutes as needed for chest pain (Up to 3 times).    pantoprazole (PROTONIX) 40 MG tablet TAKE 2 TABLETS BY MOUTH EVERY DAY   polyethylene glycol (MIRALAX / GLYCOLAX) 17 g packet Take 17 g by mouth daily.   sodium bicarbonate 650 MG tablet Take 650 mg by mouth 2 (two) times daily. Evening       04/06/2022    3:38 PM  GAD 7 : Generalized Anxiety Score  Nervous, Anxious, on Edge 2  Control/stop worrying 2  Worry too much - different things 3  Trouble relaxing 2  Restless 2  Easily annoyed or irritable 2  Afraid - awful might happen 2  Total GAD 7 Score 15  Anxiety Difficulty Extremely difficult       04/06/2022    3:38 PM 10/28/2021   11:01 AM 11/03/2020    2:47 PM  Depression screen PHQ 2/9  Decreased Interest 3 0 0  Down, Depressed, Hopeless 0 0 0  PHQ - 2 Score 3 0 0  Altered sleeping 3  3  Tired, decreased energy 3  3  Change in appetite 0  0  Feeling  bad or failure about yourself  3  0  Trouble concentrating 3  0  Moving slowly or fidgety/restless 0  0  Suicidal thoughts 1  0  PHQ-9 Score 16  6  Difficult doing work/chores Extremely dIfficult  Somewhat difficult      04/06/2022    3:33 PM 04/17/2018    3:05 PM 04/16/2016    3:06 PM  6CIT Screen  What Year? 0 points 0 points 0 points  What month? 0 points 0 points 0 points  What time? 0 points 0 points 0 points  Count back from 20 0 points 0 points 0 points  Months in reverse 4 points 4 points 4 points  Repeat phrase 2 points 2 points 10 points  Total Score 6 points 6 points 14 points      BP Readings from Last 3 Encounters:  04/06/22 122/78  02/12/22 (!) 146/66  12/28/21 132/84    Physical Exam Vitals and nursing note reviewed.  Constitutional:      General: He is not in acute distress.    Appearance: He is well-developed. He is obese.  HENT:     Head: Normocephalic and atraumatic.  Cardiovascular:     Rate and Rhythm: Normal rate and regular rhythm.     Pulses: Normal pulses.  Pulmonary:     Effort: Pulmonary effort is normal. No respiratory distress.     Breath sounds: No wheezing or rhonchi.  Musculoskeletal:        General: Tenderness present.     Cervical back: No tenderness.     Lumbar back: Signs of trauma (bruise right hip and SI region) and bony tenderness present.     Right knee: Swelling and ecchymosis present. Decreased range of motion.  Tenderness present.  Skin:    General: Skin is warm and dry.     Capillary Refill: Capillary refill takes less than 2 seconds.     Findings: No rash.  Neurological:     General: No focal deficit present.     Mental Status: He is alert and oriented to person, place, and time.  Psychiatric:        Attention and Perception: Attention normal.        Mood and Affect: Mood normal.        Behavior: Behavior normal.        Thought Content: Thought content does not include suicidal ideation. Thought content does not  include suicidal plan.        Cognition and Memory: Memory is impaired.        Judgment: Judgment normal.     Wt Readings from Last 3 Encounters:  04/06/22 241 lb (109.3 kg)  12/28/21 238 lb (108 kg)  11/24/21 235 lb (106.6 kg)    BP 122/78   Pulse (!) 104   Ht _0  (1.753 m)   Wt 241 lb (109.3 kg)   SpO2 97%   BMI 35.59 kg/m   Assessment and Plan: 1. Acute pain of right knee S/p fall onto the knee Continue tylenol as needed Recommend quad cane for ambulation - DG Knee Complete 4 Views Right  2. Spinal stenosis of lumbar region without neurogenic claudication Chronic back pain Not a surgical candidate due to cardiac dx Has seen Dr.Chasnis and had one ESI without benefit so did not return. Recommend continue tylenol and heat  3. Weakness of both lower extremities Concerning for PVD - recommend VS evaluation If adequate, would refer to PT for strengthening/balance - Ambulatory referral to Vascular Surgery  4. Coronary artery disease involving native coronary artery of native heart with unstable angina pectoris ([place]) Followed by Cardiology  5. Need for immunization against influenza - Flu Vaccine QUAD High Dose(Fluad)  6. Prediabetes Recent A1C 5.7 Continue diet control.  7. Abdominal aortic aneurysm (AAA) without rupture, unspecified part ([place]) Being monitored by Cardiology  8. Paroxysmal atrial fibrillation (HCC) On beta blocker and DOAC  9. DOE (dyspnea on exertion) Likely due to cardiac disease No pulmonary diagnosis  10. Morbid obesity ([place])  11. Hyperparathyroidism due to renal insufficiency ([place]) Followed by Nephrology   Partially dictated using Dragon software. Any errors are unintentional.  Halina Maidens, MD [facility]  04/06/2022

## 2022-04-07 ENCOUNTER — Encounter: Payer: Self-pay | Admitting: Internal Medicine

## 2022-04-16 ENCOUNTER — Encounter: Payer: Self-pay | Admitting: Cardiovascular Disease

## 2022-04-19 ENCOUNTER — Telehealth: Payer: Self-pay

## 2022-04-19 NOTE — Telephone Encounter (Signed)
Patient assistance forms signed by the DOD and faxed today.

## 2022-04-19 NOTE — Telephone Encounter (Addendum)
**Note De-Identified Ronald [name] Obfuscation** The pts completed BMSPAF application for Eliquis assistance was left at our [place] office with documents and was faxed to [place] at [name] [place].  Because Dr Angelena Form will not be in the office until next Monday 11/27, I have completed the application with our DOD, Dr Oralia Rud information to safe time and have e-mailed all to Dr Camillia Herter nurse so she can obtain Dr Waldron Session signature, date it, and to fax all to [facility] at the fax number written on the cover letter included.

## 2022-05-04 DIAGNOSIS — R600 Localized edema: Secondary | ICD-10-CM | POA: Diagnosis not present

## 2022-05-04 DIAGNOSIS — N179 Acute kidney failure, unspecified: Secondary | ICD-10-CM | POA: Diagnosis not present

## 2022-05-04 DIAGNOSIS — N2581 Secondary hyperparathyroidism of renal origin: Secondary | ICD-10-CM | POA: Diagnosis not present

## 2022-05-04 DIAGNOSIS — I129 Hypertensive chronic kidney disease with stage 1 through stage 4 chronic kidney disease, or unspecified chronic kidney disease: Secondary | ICD-10-CM | POA: Diagnosis not present

## 2022-05-04 DIAGNOSIS — N184 Chronic kidney disease, stage 4 (severe): Secondary | ICD-10-CM | POA: Diagnosis not present

## 2022-05-05 ENCOUNTER — Ambulatory Visit: Payer: Medicare Other | Attending: Physician Assistant | Admitting: Physician Assistant

## 2022-05-05 ENCOUNTER — Encounter: Payer: Self-pay | Admitting: Physician Assistant

## 2022-05-05 ENCOUNTER — Encounter: Payer: Self-pay | Admitting: Cardiovascular Disease

## 2022-05-05 VITALS — BP 118/70 | HR 100 | Ht 69.0 in | Wt 247.0 lb

## 2022-05-05 DIAGNOSIS — N184 Chronic kidney disease, stage 4 (severe): Secondary | ICD-10-CM | POA: Diagnosis not present

## 2022-05-05 DIAGNOSIS — I25119 Atherosclerotic heart disease of native coronary artery with unspecified angina pectoris: Secondary | ICD-10-CM | POA: Diagnosis not present

## 2022-05-05 DIAGNOSIS — I10 Essential (primary) hypertension: Secondary | ICD-10-CM | POA: Diagnosis not present

## 2022-05-05 DIAGNOSIS — I5020 Unspecified systolic (congestive) heart failure: Secondary | ICD-10-CM | POA: Diagnosis not present

## 2022-05-05 DIAGNOSIS — I480 Paroxysmal atrial fibrillation: Secondary | ICD-10-CM | POA: Diagnosis not present

## 2022-05-05 NOTE — Patient Instructions (Signed)
Medication Instructions:  1.Increase amiodarone to 400 mg daily 2.Increase lasix to 40 mg twice a day for the next 3 days then resume 40 mg daily *If you need a refill on your cardiac medications before your next appointment, please call your pharmacy*   Lab Work: None If you have labs (blood work) drawn today and your tests are completely normal, you will receive your results only by: [place] (if you have MyChart) OR A paper copy in the mail If you have any lab test that is abnormal or we need to change your treatment, we will call you to review the results.   Follow-Up: At [facility], you and your health needs are our priority.  As part of our continuing mission to provide you with exceptional heart care, we have created designated Provider Care Teams.  These Care Teams include your primary Cardiologist (physician) and Advanced Practice Providers (APPs -  Physician Assistants and Nurse Practitioners) who all work together to provide you with the care you need, when you need it.   Your next appointment:   2 week(s)  The format for your next appointment:   In Person  Provider:   Lauree Chandler, MD  or Richardson Dopp, PA-C  on a day that Dr Angelena Form is in the office   Important Information About Sugar

## 2022-05-05 NOTE — Assessment & Plan Note (Signed)
History of CABG in 1996 and redo bypass in 2007.  He underwent PCI with DES to the SVG-OM in 2016 in the setting of a non-STEMI and DES to the SVG-Dx in 2017 in the setting of unstable angina.  His last catheterization in 2019 demonstrated patent bypass grafts.  He has been more short of breath but has not had chest pain to suggest angina.  Continue amlodipine 5 mg daily, Lipitor 40 mg daily, Imdur 30 mg daily, metoprolol tartrate 100 mg twice daily, nitroglycerin as needed.

## 2022-05-05 NOTE — Assessment & Plan Note (Addendum)
He is in what looks like atrial flutter today with a heart rate of 100.  His wife notes that he was at a doctor's visit about a month ago and his heart rate was around 100.  It is typically in the 45s.  I suspect he has been in atrial flutter for the past month.  He has fallen a couple of times.  I suspect he got weak with his atrial arrhythmia leading to his fall.  He seems to be developing some heart failure symptoms.  His weight is up about 10 pounds.  He thinks he missed a dose of Eliquis once last week.  Otherwise, he has not missed any doses in the recent past.  I have recommended that we adjust his amiodarone and proceed with cardioversion if he does not convert to sinus rhythm on his own.  I reviewed this with Dr. Angelena Form who agreed. Increase amiodarone to 400 mg daily Continue Eliquis 5 mg twice daily Follow-up in 2 weeks Schedule cardioversion if patient remains in atrial fibrillation/flutter

## 2022-05-05 NOTE — Assessment & Plan Note (Signed)
He is followed by nephrology in [place] (Dr. Candiss Norse).  He is being prepared for dialysis soon.

## 2022-05-05 NOTE — Telephone Encounter (Signed)
Late Entry - Spoke with pt's wife who reports increasing SOB x 1 week.  Pt has some edema in right lower extremity and is having to sleep in his recliner.  Pt denies current CP.  Appointment scheduled today with Richardson Dopp, PA-C.  Pt's wife thanked Therapist, sports for the call and verbalizes understanding of current plan.

## 2022-05-05 NOTE — Progress Notes (Signed)
Cardiology Office Note:    Date:  05/05/2022   ID:  Ronald [name], DOB Nov 02, 1945, MRN [id number]  PCP:  Glean Hess, MD  [facility] Prov[facility] Cardiologist:  Lauree Chandler, MD    Referring MD: Glean Hess, MD   Chief Complaint:  Shortness of Breath    Patient Profile: Coronary artery disease  s/p CABG in 1996 (LIMA-LAD, SVG-circumflex, SVG-RCA, SVG-diagonal)  s/p redo bypass in 2007 (SVG-OM, SVG-RCA) NSTEMI 11/2014 s/p DES to S-OM Intol of [place] 2/2 shortness of breath  [place] 09/2015 s/p DES to S-Dx Cath 07/28/2017: grafts patent (LIMA-LAD, Y graft from 1996-Dx/distal OM w P stent in the diagonal limb,S-RCA with patent proximal stent) Ischemic cardiomyopathy HFrEF (heart failure with reduced ejection fraction)  TTE 07/2016: EF 45-50  TTE 07/2017: EF 55-60 TTE 06/05/2018: EF 30-35, diffuse HK, mild MR, mild LAE, mild reduced RVSF Atrial fibrillation/flutter Amiodarone Rx  Eliquis Rx  PVCs Chronic kidney disease  Diabetes mellitus  Hypertension  Hyperlipidemia Abdominal aortic aneurysm  [place] 09/01/2020: 3.1 cm  Renal artery stenosis Chronic Obstructive Pulmonary Disease OSA on CPAP GERD    History of Present Illness:   Ronald [name] is a 76 y.o. male with the above problem list.  He was last seen by Dr. Angelena Form 12/28/2021.  He contacted the office today with symptoms of shortness of breath and difficulty sleeping.  He is added on for further evaluation.   He is here today with his wife.  He has chronic shortness of breath.  He has noted worsening shortness of breath over the past week.  This is mainly at night.  He has had to increase the incline he sleeps on.  He has noted symptoms that are consistent with paroxysmal nocturnal dyspnea.  He has noted weight gain as well as worsening lower extremity edema.  He has not had chest pain or syncope.  EKG: Atrial flutter with 2:1 conduction, HR 100    Reviewed and updated this encounter:  Tobacco   Allergies  Meds  Problems  Med Hx  Surg Hx  Fam Hx     Review of Systems  Constitutional: Negative for fever.  Respiratory:  Negative for cough.   Gastrointestinal:  Negative for diarrhea, hematochezia, melena and vomiting.  Genitourinary:  Negative for hematuria.    Labs/Other Test Reviewed:   Recent Labs: 08/07/2021: TSH 2.395 10/01/2021: Hemoglobin 12.6; Platelets 175 11/24/2021: ALT 28; BUN 58; Creatinine, Ser 3.66; Potassium 5.5; Sodium 141   Recent Lipid Panel Recent Labs    11/24/21 1625  CHOL 153  TRIG 54  HDL 60  LDLCALC 82    Risk Assessment/Calculations/Metrics:   CHA2DS2-VASc Score = 6   This indicates a 9.7% annual risk of stroke. The patient's score is based upon: CHF History: 1 HTN History: 1 Diabetes History: 1 Stroke History: 0 Vascular Disease History: 1 Age Score: 2 Gender Score: 0             Physical Exam:    VS:  BP 118/70 (BP Location: Right Arm, Patient Position: Sitting, Cuff Size: Large)   Pulse 100   Ht '5\' 9"'$  (1.753 m)   Wt 247 lb (112 kg)   SpO2 93%   BMI 36.48 kg/m     Wt Readings from Last 3 Encounters:  05/05/22 247 lb (112 kg)  04/06/22 241 lb (109.3 kg)  12/28/21 238 lb (108 kg)    Constitutional:      Appearance: Healthy appearance. Not in distress.  Neck:  Vascular: JVD normal.  Pulmonary:     Effort: Pulmonary effort is normal.     Breath sounds: No wheezing. No rales.  Cardiovascular:     Tachycardia present. Irregularly irregular rhythm. Normal S1. Normal S2.      Murmurs: There is no murmur.  Edema:    Peripheral edema present.    Pretibial: bilateral 1+ edema of the pretibial area. Abdominal:     Palpations: Abdomen is soft.  Neurological:     General: No focal deficit present.     Mental Status: Alert and oriented to person, place and time.         ASSESSMENT & PLAN:   Paroxysmal atrial fibrillation ([place]) He is in what looks like atrial flutter today with a heart rate of 100.  His wife notes  that he was at a doctor's visit about a month ago and his heart rate was around 100.  It is typically in the 67s.  I suspect he has been in atrial flutter for the past month.  He has fallen a couple of times.  I suspect he got weak with his atrial arrhythmia leading to his fall.  He seems to be developing some heart failure symptoms.  His weight is up about 10 pounds.  He thinks he missed a dose of Eliquis once last week.  Otherwise, he has not missed any doses in the recent past.  I have recommended that we adjust his amiodarone and proceed with cardioversion if he does not convert to sinus rhythm on his own.  I reviewed this with Dr. Angelena Form who agreed. Increase amiodarone to 400 mg daily Continue Eliquis 5 mg twice daily Follow-up in 2 weeks Schedule cardioversion if patient remains in atrial fibrillation/flutter  HFrEF (heart failure with reduced ejection fraction) (HCC) EF 30-35 by echocardiogram in 2020.  Volume management is exacerbated by atrial fibrillation/flutter in the setting of his chronic kidney disease.  He does have some evidence of volume excess on exam today.  Increase furosemide to 40 mg twice daily for 3 days.  Then resume 40 mg daily.  Follow-up in 2 weeks as noted.  Coronary artery disease involving native coronary artery with angina pectoris ([place]) History of CABG in 1996 and redo bypass in 2007.  He underwent PCI with DES to the SVG-OM in 2016 in the setting of a non-STEMI and DES to the SVG-Dx in 2017 in the setting of unstable angina.  His last catheterization in 2019 demonstrated patent bypass grafts.  He has been more short of breath but has not had chest pain to suggest angina.  Continue amlodipine 5 mg daily, Lipitor 40 mg daily, Imdur 30 mg daily, metoprolol tartrate 100 mg twice daily, nitroglycerin as needed.  CKD (chronic kidney disease), stage IV [facility]) He is followed by nephrology in [place] (Dr. Candiss Norse).  He is being prepared for dialysis soon.  Essential  hypertension The patient's blood pressure is controlled on his current regimen.  Continue current therapy.             Dispo:  Return in about 2 weeks (around 05/19/2022) for Close Follow Up with Dr. Angelena Form, or Richardson Dopp, PA-C.   Medication Adjustments/Labs and Tests Ordered: Current medicines are reviewed at length with the patient today.  Concerns regarding medicines are outlined above.  Tests Ordered: Orders Placed This Encounter  Procedures   EKG 12-Lead   Medication Changes: No orders of the defined types were placed in this encounter.  Signed, Richardson Dopp, PA-C  05/05/2022 5:53 PM    [address], [place], [place]  [address] Phone: [phone]; Fax: [phone]

## 2022-05-05 NOTE — Assessment & Plan Note (Signed)
The patient's blood pressure is controlled on his current regimen.  Continue current therapy.  

## 2022-05-05 NOTE — Assessment & Plan Note (Signed)
EF 30-35 by echocardiogram in 2020.  Volume management is exacerbated by atrial fibrillation/flutter in the setting of his chronic kidney disease.  He does have some evidence of volume excess on exam today.  Increase furosemide to 40 mg twice daily for 3 days.  Then resume 40 mg daily.  Follow-up in 2 weeks as noted.

## 2022-05-18 NOTE — Progress Notes (Unsigned)
MRN : [id number]  Ronald [name] is a 76 y.o. (Nov 07, 1945) male who presents with chief complaint of check circulation.  History of Present Illness:   The patient is seen for evaluation of painful lower extremities. Patient notes the pain is variable and not always associated with activity.  The pain is somewhat consistent day to day occurring on most days. The patient notes the pain also occurs with standing and routinely seems worse as the day wears on. The pain has been progressive over the past several years. The patient states these symptoms are causing  a negative impact on quality of life and daily activities which was a factor in the referral.  The patient has a  history of severe back problems and DJD of the lumbar and sacral spine.   The patient denies rest pain or dangling of an extremity off the side of the bed during the night for relief.  No open wounds or sores at this time. No history of DVT or phlebitis. No prior vascular interventions or surgeries.  CT scan of the abdomen pelvis dated February 13, 2021 demonstrates diffuse calcific disease throughout the arterial system.  The infrarenal abdominal aorta measures 2.8 cm in maximal diameter.  The descending thoracic aorta measures 3.1 cm in maximal diameter.  ABI's done today Rt=0.94 and Lt=0.85 (previous ABI's done 07/07/2016 Rt=1.05 and Lt=0.96)  No outpatient medications have been marked as taking for the 05/20/22 encounter (Appointment) with Delana Meyer, Dolores Lory, MD.    Past Medical History:  Diagnosis Date   AAA (abdominal aortic aneurysm) ([place])    a. 3cm by [place] 2015.   Arthritis    "hips; back" (12/13/2014)   CAD (coronary artery disease) 2007   a. s/p CABG- IMA-LAD, VG-Cx, VG-RCA, VG-diag in 1999. B. sp redo CABG- VG-OM, VG-RCA in 2007 due to VG disease. c. NSTEMI 11/2014 s/p DES to SVG-OM from the Y graft.d. PTCA/DES x 1 distal body of SVG to Diagonal.09/2015   Chronic combined systolic and  diastolic CHF (congestive heart failure) ([place])    a. remote EF 40-45% in 2006. b. Normal EF 2014. b. Echo 07/2016 EF 45-50%, grade 1 DD. c. Echo 2020 30% to 35%. Diffuse EF 30-35%, diffuse hypokinesis.   Chronic lower back pain    CKD (chronic kidney disease), stage IV (HCC)    COPD (chronic obstructive pulmonary disease) (HCC)    Deafness in left ear    Degenerative disc disease, lumbar    Diabetes mellitus, type 2 ([place]) 10/04/2014   Microalbumin 05/11/2012-100. Foot exam/monofilament 05/11/2012-normal.   Dilated cardiomyopathy ([place]) 10/07/2015   Emphysema    Esophageal stricture 07/02/1998   EGD   Genital candidiasis in male 10/25/2012   GERD (gastroesophageal reflux disease)    History of gout    "last flareup was in 2007" (12/13/2014)   History of hiatal hernia    Hyperlipidemia    Hypertension    Ischemic cardiomyopathy 2006   Echo 2020: EF 30-35%, diffuse hypokinesis   NSTEMI (non-ST elevated myocardial infarction) ([place]) 12/13/2014   PVC's (premature ventricular contractions)    Renal artery stenosis ([place])    a. noted on CT 2008.   Type II diabetes mellitus (HCC)    Diet control    Unstable angina ([place]) 07/27/2017   Walking pneumonia 1990's   Wears dentures    full upper    Past Surgical History:  Procedure Laterality Date  CARDIAC CATHETERIZATION  "several"   CARDIAC CATHETERIZATION N/A 12/13/2014   Procedure: Left Heart Cath and Coronary Angiography;  Surgeon: Jettie Booze, MD;  Location: [facility];  Service: Cardiovascular;  Laterality: N/A;   CARDIAC CATHETERIZATION  12/13/2014   Procedure: Coronary Stent Intervention;  Surgeon: Jettie Booze, MD;  Location: [facility];  Service: Cardiovascular;;   CARDIAC CATHETERIZATION N/A 10/07/2015   Procedure: Left Heart Cath and Cors/Grafts Angiography;  Surgeon: Burnell Blanks, MD;  Location: [facility];  Service: Cardiovascular;  Laterality: N/A;   CARDIAC CATHETERIZATION N/A  10/07/2015   Procedure: Coronary Stent Intervention;  Surgeon: Burnell Blanks, MD;  Location: [facility];  Service: Cardiovascular;  Laterality: N/A;   CATARACT EXTRACTION W/PHACO Left 10/21/2021   Procedure: CATARACT EXTRACTION PHACO AND INTRAOCULAR LENS PLACEMENT (IOC) LEFT 3.94 00:33.7;  Surgeon: Eulogio Bear, MD;  Location: [facility];  Service: Ophthalmology;  Laterality: Left;   CATARACT EXTRACTION W/PHACO Right 11/09/2021   Procedure: CATARACT EXTRACTION PHACO AND INTRAOCULAR LENS PLACEMENT (IOC) RIGHT;  Surgeon: Eulogio Bear, MD;  Location: [facility];  Service: Ophthalmology;  Laterality: Right;  3.59 0:29.4   CORONARY ANGIOPLASTY  "several"   CORONARY ANGIOPLASTY WITH STENT PLACEMENT  2005; 12/13/2014   "2; 1"   CORONARY ARTERY BYPASS GRAFT  1996   CABG X5   CORONARY ARTERY BYPASS GRAFT  March 2007   CABG X3   ESOPHAGOGASTRODUODENOSCOPY (EGD) WITH ESOPHAGEAL DILATION  2000   GREEN LIGHT LASER TURP (TRANSURETHRAL RESECTION OF PROSTATE  2000's   "not cancerous"   HERNIA REPAIR     LAPAROSCOPIC CHOLECYSTECTOMY     LEFT HEART CATH AND CORS/GRAFTS ANGIOGRAPHY N/A 07/28/2017   Procedure: LEFT HEART CATH AND CORS/GRAFTS ANGIOGRAPHY;  Surgeon: Troy Sine, MD;  Location: [facility];  Service: Cardiovascular;  Laterality: N/A;   LUNG SURGERY  1996   "S/P CABG, had to put staple in lung after it had collapsed"   UMBILICAL HERNIA REPAIR     w/chole    Social History Social History   Tobacco Use   Smoking status: Former    Packs/day: 3.00    Years: 20.00    Total pack years: 60.00    Types: Cigarettes    Quit date: 07/25/1986    Years since quitting: 35.8   Smokeless tobacco: Never  Vaping Use   Vaping Use: Never used  Substance Use Topics   Alcohol use: No    Alcohol/week: 0.0 standard drinks of alcohol   Drug use: No    Family History Family History  Problem Relation Age of Onset   Heart attack Mother        MI    Stroke Mother    Heart disease Mother    Hypertension Mother    Hyperlipidemia Mother    Asthma Mother    Heart disease Father    Rheumatic fever Father    Colon cancer Neg Hx     Allergies  Allergen Reactions   Predicort [Prednisolone] Other (See Comments)    Stomach pain   Ciprofloxacin Other (See Comments)    GI upset   Hydrochlorothiazide Other (See Comments)    Dehydration  Other reaction(s): Other (See Comments) dehydrates Dehydration   Hydrocodone Nausea Only and Other (See Comments)    Stomach upset  Stomach upset    Stomach upset Stomach upset   Hydrocodone-Acetaminophen Nausea Only    Stomach upset   Sulfa Antibiotics  Other (See Comments)    Cannot recall   Sulfacetamide Sodium Other (See Comments)    Cannot recall   Sulfasalazine Other (See Comments)    Cannot recall   Hydrocodone-Acetaminophen Nausea Only and Other (See Comments)    Stomach upset   Penicillins Hives, Rash and Other (See Comments)    Has patient had a PCN reaction causing immediate rash, facial/tongue/throat swelling, SOB or lightheadedness with hypotension: YES  Has patient had a PCN reaction causing severe rash involving mucus membranes or skin necrosis: NO  Has patient had a PCN reaction that required hospitalization NO  Has patient had a PCN reaction occurring within the last 10 years:NO  If all of the above answers are "NO", then may proceed with Cephalosporin use.  Has patient had a PCN reaction causing immediate rash, facial/tongue/throat swelling, SOB or lightheadedness with hypotension: YES Has patient had a PCN reaction causing severe rash involving mucus membranes or skin necrosis: NO Has patient had a PCN reaction that required hospitalization NO Has patient had a PCN reaction occurring within the last 10 years:NO If all of the above answers are "NO", then may proceed with Cephalosporin use.     REVIEW OF SYSTEMS (Negative unless checked)  Constitutional: '[]'$ Weight loss   '[]'$ Fever  '[]'$ Chills Cardiac: '[]'$ Chest pain   '[]'$ Chest pressure   '[]'$ Palpitations   '[]'$ Shortness of breath when laying flat   '[]'$ Shortness of breath with exertion. Vascular:  '[x]'$ Pain in legs with walking   '[]'$ Pain in legs at rest  '[]'$ History of DVT   '[]'$ Phlebitis   '[]'$ Swelling in legs   '[]'$ Varicose veins   '[]'$ Non-healing ulcers Pulmonary:   '[]'$ Uses home oxygen   '[]'$ Productive cough   '[]'$ Hemoptysis   '[]'$ Wheeze  '[]'$ COPD   '[]'$ Asthma Neurologic:  '[]'$ Dizziness   '[]'$ Seizures   '[]'$ History of stroke   '[]'$ History of TIA  '[]'$ Aphasia   '[]'$ Vissual changes   '[]'$ Weakness or numbness in arm   '[]'$ Weakness or numbness in leg Musculoskeletal:   '[]'$ Joint swelling   '[]'$ Joint pain   '[]'$ Low back pain Hematologic:  '[]'$ Easy bruising  '[]'$ Easy bleeding   '[]'$ Hypercoagulable state   '[]'$ Anemic Gastrointestinal:  '[]'$ Diarrhea   '[]'$ Vomiting  '[]'$ Gastroesophageal reflux/heartburn   '[]'$ Difficulty swallowing. Genitourinary:  '[]'$ Chronic kidney disease   '[]'$ Difficult urination  '[]'$ Frequent urination   '[]'$ Blood in urine Skin:  '[]'$ Rashes   '[]'$ Ulcers  Psychological:  '[]'$ History of anxiety   '[]'$  History of major depression.  Physical Examination  There were no vitals filed for this visit. There is no height or weight on file to calculate BMI. Gen: WD/WN, NAD Head: [place]/AT, No temporalis wasting.  Ear/Nose/Throat: Hearing grossly intact, nares w/o erythema or drainage Eyes: PER, EOMI, sclera nonicteric.  Neck: Supple, no masses.  No bruit or JVD.  Pulmonary:  Good air movement, no audible wheezing, no use of accessory muscles.  Cardiac: RRR, normal S1, S2, no Murmurs. Vascular:  mild trophic changes, no open wounds Vessel Right Left  Radial Palpable Palpable  PT Trace Palpable Trace  Palpable  DP Trace Palpable Trace  Palpable  Gastrointestinal: soft, non-distended. No guarding/no peritoneal signs.  Musculoskeletal: M/S 5/5 throughout.  No visible deformity.  Neurologic: CN 2-12 intact. Pain and light touch intact in extremities.  Symmetrical.  Speech is fluent. Motor exam as  listed above. Psychiatric: Judgment intact, Mood & affect appropriate for pt's clinical situation. Dermatologic: No rashes or ulcers noted.  No changes consistent with cellulitis.   CBC Lab Results  Component Value Date   WBC 5.9 10/01/2021   HGB  12.6 (L) 10/01/2021   HCT 38.9 (L) 10/01/2021   MCV 89.6 10/01/2021   PLT 175 10/01/2021    BMET    Component Value Date/Time   NA 141 11/24/2021 1625   K 5.5 (H) 11/24/2021 1625   CL 103 11/24/2021 1625   CO2 22 11/24/2021 1625   GLUCOSE 84 11/24/2021 1625   GLUCOSE 139 (H) 08/12/2021 1351   GLUCOSE 82 06/13/2006 1621   BUN 58 (H) 11/24/2021 1625   CREATININE 3.66 (H) 11/24/2021 1625   CREATININE 2.44 (H) 11/24/2015 1546   CALCIUM 9.7 11/24/2021 1625   GFRNONAA 21 (L) 08/12/2021 1351   GFRAA 28 (L) 03/20/2020 1038   CrCl cannot be calculated (Patient's most recent lab result is older than the maximum 21 days allowed.).  COAG Lab Results  Component Value Date   INR 1.64 06/12/2018   INR 0.95 06/10/2018   INR 1.04 07/28/2017    Radiology No results found.   Assessment/Plan 1. Atherosclerosis of native artery of both lower extremities with intermittent claudication (HCC)  Recommend:  The patient has evidence of atherosclerosis of the lower extremities with claudication.  The patient does not voice lifestyle limiting changes at this point in time.  Noninvasive studies do not suggest clinically significant change.  No invasive studies, angiography or surgery at this time The patient should continue walking and begin a more formal exercise program.  The patient should continue antiplatelet therapy and aggressive treatment of the lipid abnormalities  No changes in the patient's medications at this time  Continued surveillance is indicated as atherosclerosis is likely to progress with time.    The patient will continue follow up with noninvasive studies as ordered.  - VAS [place] ABI WITH/WO TBI; Future  2. Abdominal  aortic aneurysm (AAA) without rupture, unspecified part ([place]) Recommend: No surgery or intervention is indicated at this time.  The patient has an asymptomatic abdominal aortic aneurysm that is less than 4 cm in maximal diameter.    I have reviewed the natural history of abdominal aortic aneurysm and the small risk of rupture for aneurysm less than 5 cm in size.  However, as these small aneurysms tend to enlarge over time, continued surveillance with ultrasound or CT scan is mandatory.   I have also discussed optimizing medical management with hypertension and lipid control and the importance of abstinence from tobacco.  The patient is also encouraged to exercise a minimum of 30 minutes 4 times a week.   Should the patient develop new onset abdominal or back pain or signs of peripheral embolization they are instructed to seek medical attention immediately and to alert the physician providing care that they have an aneurysm.   The patient voices their understanding.  The patient will return in 12 months with an aortic duplex. - VAS US AORTA/IVC/ILIACS; Future  3. Aneurysm of ascending aorta without rupture (HCC) Recommend:  No surgery or intervention is indicated at this time.  The patient has an asymptomatic thoracic aortic aneurysm that is less than 6.0 cm in maximal diameter.  I have discussed the natural history of thoracic aortic aneurysm and the small risk of rupture for aneurysm less than 6.5 cm in size.  However, as these small aneurysms tend to enlarge over time, continued surveillance with CT scan is mandatory.   I have also discussed optimizing medical management with hypertension and lipid control and the importance of abstinence from tobacco.  The patient is also encouraged to exercise a minimum of 30 minutes 4  times a week.   Should the patient develop new onset chest or back pain or signs of peripheral embolization they are instructed to seek medical attention immediately  and to alert the physician providing care that they have an aneurysm in the chest.   The patient voices their understanding.  The patient will return as ordered with a CT scan of the chest done every 3 years.  4. Renal artery stenosis  Recommend:  Given patient's arterial disease optimal control of the patient's hypertension is important. BP is acceptable today  The patient's vital signs and noninvasive studies support the renal artery stenosis is not significantly increased when compared to the previous study.  No invasive studies or intervention is indicated at this time.  The patient will continue the current antihypertensive medications, no changes at this time.  The primary medical service will continue aggressive antihypertensive therapy as per the AHA guidelines   5. Essential hypertension Continue antihypertensive medications as already ordered, these medications have been reviewed and there are no changes at this time.  6. Coronary artery disease involving native coronary artery of native heart with angina pectoris ([place]) Continue cardiac and antihypertensive medications as already ordered and reviewed, no changes at this time.  Continue statin as ordered and reviewed, no changes at this time  Nitrates PRN for chest pain  7. Bilateral leg weakness Recommend:  The patient has atypical pain symptoms for vascular disease and on exam I do not find evidence of vascular pathology that would explain the patient's symptoms.  Noninvasive studies do not identify significant vascular problems  I suspect the patient is c/o pseudoclaudication.  Patient should have an evaluation of the LS spine which I defer to the primary service or the Spine service.  The patient should continue walking and begin a more formal exercise program. The patient should continue his antiplatelet therapy and aggressive treatment of the lipid abnormalities.  - VAS [place] ABI WITH/WO TBI  8. Decreased pedal  pulses Recommend:  The patient has atypical pain symptoms for vascular disease and on exam I do not find evidence of vascular pathology that would explain the patient's symptoms.  Noninvasive studies do not identify significant vascular problems  I suspect the patient is c/o pseudoclaudication.  Patient should have an evaluation of the LS spine which I defer to the primary service or the Spine service.  The patient should continue walking and begin a more formal exercise program. The patient should continue his antiplatelet therapy and aggressive treatment of the lipid abnormalities.  - VAS [place] ABI WITH/WO TBI    Hortencia Pilar, MD  05/18/2022 12:55 PM

## 2022-05-19 ENCOUNTER — Encounter: Payer: Self-pay | Admitting: Physician Assistant

## 2022-05-19 ENCOUNTER — Other Ambulatory Visit (INDEPENDENT_AMBULATORY_CARE_PROVIDER_SITE_OTHER): Payer: Self-pay | Admitting: Vascular Surgery

## 2022-05-19 ENCOUNTER — Ambulatory Visit: Payer: Medicare Other | Attending: Physician Assistant | Admitting: Physician Assistant

## 2022-05-19 VITALS — BP 118/62 | HR 97 | Ht 70.0 in | Wt 246.4 lb

## 2022-05-19 DIAGNOSIS — N184 Chronic kidney disease, stage 4 (severe): Secondary | ICD-10-CM | POA: Diagnosis not present

## 2022-05-19 DIAGNOSIS — I480 Paroxysmal atrial fibrillation: Secondary | ICD-10-CM | POA: Diagnosis not present

## 2022-05-19 DIAGNOSIS — I5020 Unspecified systolic (congestive) heart failure: Secondary | ICD-10-CM | POA: Diagnosis not present

## 2022-05-19 DIAGNOSIS — R0989 Other specified symptoms and signs involving the circulatory and respiratory systems: Secondary | ICD-10-CM

## 2022-05-19 DIAGNOSIS — R29898 Other symptoms and signs involving the musculoskeletal system: Secondary | ICD-10-CM

## 2022-05-19 DIAGNOSIS — I25119 Atherosclerotic heart disease of native coronary artery with unspecified angina pectoris: Secondary | ICD-10-CM | POA: Diagnosis not present

## 2022-05-19 MED ORDER — FUROSEMIDE 80 MG PO TABS: ORAL_TABLET | ORAL | 3 refills | Status: DC

## 2022-05-19 NOTE — Assessment & Plan Note (Signed)
He pains and what looks like atrial flutter.  His heart rate is better at 81 on the higher dose of amiodarone.  He is significantly symptomatic with this.  I did ask him if he would like to proceed with cardioversion earlier than next week.  However, he prefers to wait until 12/26.  Continue amiodarone 400 mg daily, Eliquis 5 mg twice daily.  Continue metoprolol tartrate 100 mg twice daily.  Proceed with cardioversion next week as planned.  Follow-up in the atrial fibrillation clinic afterward.  Follow-up with Dr. Angelena Form in 1 month.

## 2022-05-19 NOTE — H&P (View-Only) (Signed)
Cardiology Office Note:    Date:  05/19/2022   ID:  Ronald [name], DOB September 20, 1945, MRN [id number]  PCP:  Glean Hess, MD  [place] [facility] Cardiologist:  Lauree Chandler, MD    Referring MD: Glean Hess, MD   Chief Complaint:  Follow-up for atrial fibrillation/flutter    Patient Profile: Coronary artery disease  s/p CABG in 1996 (LIMA-LAD, SVG-circumflex, SVG-RCA, SVG-diagonal)  s/p redo bypass in 2007 (SVG-OM, SVG-RCA) NSTEMI 11/2014 s/p DES to S-OM Intol of Brilinta 2/2 shortness of breath  [place] 09/2015 s/p DES to S-Dx Cath 07/28/2017: grafts patent (LIMA-LAD, Y graft from 1996-Dx/distal OM w P stent in the diagonal limb,S-RCA with patent proximal stent) Ischemic cardiomyopathy HFrEF (heart failure with reduced ejection fraction)  TTE 07/2016: EF 45-50  TTE 07/2017: EF 55-60 TTE 06/05/2018: EF 30-35, diffuse HK, mild MR, mild LAE, mild reduced RVSF Atrial fibrillation/flutter Amiodarone Rx  Eliquis Rx  PVCs Chronic kidney disease  Diabetes mellitus  Hypertension  Hyperlipidemia Abdominal aortic aneurysm  [place] 09/01/2020: 3.1 cm  Renal artery stenosis Chronic Obstructive Pulmonary Disease OSA on CPAP GERD     History of Present Illness:   Ronald [name] is a 76 y.o. male with the above problem list.  He was last seen 05/05/2022.  He was in atrial flutter.  I increased his amiodarone and brought him back today to arrange cardioversion if he has not already converted back to sinus rhythm.    He is here today with his wife.  Electrocardiogram continues to demonstrate atrial flutter.  He has not felt any better since he was last here.  He has not had chest pain.  He has shortness of breath with minimal activity.  He has been sleeping in a recliner.  He has had symptoms of PND in the recent weeks.  He has mild lower extremity edema.  This may have improved slightly with the increase Lasix for 3 days.  He notes insomnia over the last few weeks.  He has not  had syncope.    EKG: Atrial flutter, HR 81, QTc 476    Reviewed and updated this encounter:  Tobacco  Allergies  Meds  Problems  Med Hx  Surg Hx  Fam Hx     Review of Systems  Gastrointestinal:  Negative for hematochezia and melena.  Genitourinary:  Negative for hematuria.    Labs/Other Test Reviewed:   Recent Labs: 08/07/2021: TSH 2.395 10/01/2021: Hemoglobin 12.6; Platelets 175 11/24/2021: ALT 28; BUN 58; Creatinine, Ser 3.66; Potassium 5.5; Sodium 141   Recent Lipid Panel Recent Labs    11/24/21 1625  CHOL 153  TRIG 54  HDL 60  LDLCALC 82    Risk Assessment/Calculations/Metrics:    CHA2DS2-VASc Score = 6   This indicates a 9.7% annual risk of stroke. The patient's score is based upon: CHF History: 1 HTN History: 1 Diabetes History: 1 Stroke History: 0 Vascular Disease History: 1 Age Score: 2 Gender Score: 0            Physical Exam:   VS:  BP 118/62   Pulse 97   Ht '5\' 10"'$  (1.778 m)   Wt 246 lb 6.4 oz (111.8 kg)   SpO2 93%   BMI 35.35 kg/m    Wt Readings from Last 3 Encounters:  05/19/22 246 lb 6.4 oz (111.8 kg)  05/05/22 247 lb (112 kg)  04/06/22 241 lb (109.3 kg)    Constitutional:      Appearance: Healthy appearance. Not  in distress.  Neck:     Vascular: JVR present. JVD normal.  Pulmonary:     Effort: Pulmonary effort is normal.     Breath sounds: No wheezing. No rales.  Cardiovascular:     Normal rate. Regular rhythm. Normal S1. Normal S2.      Murmurs: There is no murmur.  Edema:    Peripheral edema present.    Pretibial: bilateral trace edema of the pretibial area. Abdominal:     Palpations: Abdomen is soft. There is no hepatomegaly.         ASSESSMENT & PLAN:   Paroxysmal atrial fibrillation (HCC) He pains and what looks like atrial flutter.  His heart rate is better at 81 on the higher dose of amiodarone.  He is significantly symptomatic with this.  I did ask him if he would like to proceed with cardioversion earlier than next  week.  However, he prefers to wait until 12/26.  Continue amiodarone 400 mg daily, Eliquis 5 mg twice daily.  Continue metoprolol tartrate 100 mg twice daily.  Proceed with cardioversion next week as planned.  Follow-up in the atrial fibrillation clinic afterward.  Follow-up with Dr. Angelena Form in 1 month.  HFrEF (heart failure with reduced ejection fraction) (HCC) EF 30-35 by echocardiogram in 2020.  He continues to have symptoms of heart failure in the setting of atrial flutter.  Obtain BMET, CBC today in preparation for his cardioversion.  Increase furosemide to 80 mg every other day alternating with 40 mg every other day.  Obtain follow-up BMET when he returns to the atrial fibrillation clinic after his cardioversion.  Coronary artery disease involving native coronary artery with angina pectoris ([place]) History of CABG in 1996 and redo bypass in 2007.  He underwent PCI with DES to the SVG-OM in 2016 in the setting of a non-STEMI and DES to the SVG-Dx in 2017 in the setting of unstable angina.  His last catheterization in 2019 demonstrated patent bypass grafts.  He is currently not having chest pain that is consistent with angina.  Continue amlodipine 5 mg daily, Lipitor 40 mg daily, Imdur 30 mg daily, metoprolol tartrate 100 mg twice daily, nitroglycerin as needed.  CKD (chronic kidney disease), stage IV ([place]) Obtain follow-up BMET today.  Repeat BMET at follow-up in the atrial fibrillation clinic after his cardioversion.        Shared Decision Making/Informed Consent The risks (stroke, cardiac arrhythmias rarely resulting in the need for a temporary or permanent pacemaker, skin irritation or burns and complications associated with conscious sedation including aspiration, arrhythmia, respiratory failure and death), benefits (restoration of normal sinus rhythm) and alternatives of a direct current cardioversion were explained in detail to Mr. Copes and he agrees to proceed.     Dispo:  Return in  about 4 weeks (around 06/16/2022) for Routine Follow Up with Dr. Angelena Form.  Medication Adjustments/Labs and Tests Ordered: Current medicines are reviewed at length with the patient today.  Concerns regarding medicines are outlined above.  Tests Ordered: Orders Placed This Encounter  Procedures   Basic metabolic panel   CBC   EKG 12-Lead   Medication Changes: Meds ordered this encounter  Medications   furosemide (LASIX) 80 MG tablet    Sig: Take 1 tablet by mouth every other day alternating with 1/2 tablet every other day    Dispense:  90 tablet    Refill:  3   Signed, Richardson Dopp, PA-C  05/19/2022 5:15 PM    [facility] [address] [place], [place]  [address] Phone: [phone]; Fax: [phone]

## 2022-05-19 NOTE — Patient Instructions (Signed)
Medication Instructions:  Your physician has recommended you make the following change in your medication:   INCREASE the Lasix to 80 mg taking 1 tablet one day alternating with 1/2 tablet every other day  *If you need a refill on your cardiac medications before your next appointment, please call your pharmacy*   Lab Work: TODAY:  BMET & CBC  If you have labs (blood work) drawn today and your tests are completely normal, you will receive your results only by: MyChart Message (if you have MyChart) OR A paper copy in the mail If you have any lab test that is abnormal or we need to change your treatment, we will call you to review the results.   Testing/Procedures: None ordered today, but will proceed with the Cardioversion on 05/25/22.      Dear [facility]  You are scheduled for a Cardioversion on Tuesday, December 26 with Dr. Harriet Masson.  Please arrive at the [facility] (Main Entrance A) at [facility]: [address] [place], [place] [address] at 9:00 AM.   DIET:  Nothing to eat or drink after midnight except a sip of water with medications (see medication instructions below)  MEDICATION INSTRUCTIONS: DO NOT MISS A DOSE OF ELIQUIS, HOLD THE LASIX (FUROSEMIDE)  Continue taking your anticoagulant (blood thinner): Apixaban (Eliquis).  You will need to continue this after your procedure until you are told by your provider that it is safe to stop.    LABS: WILL BE DONE TODAY  FYI:  For your safety, and to allow [place] to monitor your vital signs accurately during the surgery/procedure we request: If you have artificial nails, gel coating, SNS etc, please have those removed prior to your surgery/procedure. Not having the nail coverings /polish removed may result in cancellation or delay of your surgery/procedure.  You must have a responsible person to drive you home and stay in the waiting area during your procedure. Failure to do so could result in cancellation.  Bring your  insurance cards.  *Special Note: Every effort is made to have your procedure done on time. Occasionally there are emergencies that occur at the hospital that may cause delays. Please be patient if a delay does occur.      Follow-Up: At [facility], you and your health needs are our priority.  As part of our continuing mission to provide you with exceptional heart care, we have created designated Provider Care Teams.  These Care Teams include your primary Cardiologist (physician) and Advanced Practice Providers (APPs -  Physician Assistants and Nurse Practitioners) who all work together to provide you with the care you need, when you need it.  We recommend signing up for the patient portal called "MyChart".  Sign up information is provided on this After Visit Summary.  MyChart is used to connect with patients for Virtual Visits (Telemedicine).  Patients are able to view lab/test results, encounter notes, upcoming appointments, etc.  Non-urgent messages can be sent to your provider as well.   To learn more about what you can do with MyChart, go to [email or web address].    Your next appointment:   1 week AFTER 05/25/22 WITH THE AFIB CLINIC  1 month(s)  The format for your next appointment:   In Person  Provider:   Lauree Chandler, MD     Other Instructions   Important Information About [place]

## 2022-05-19 NOTE — Assessment & Plan Note (Signed)
EF 30-35 by echocardiogram in 2020.  He continues to have symptoms of heart failure in the setting of atrial flutter.  Obtain BMET, CBC today in preparation for his cardioversion.  Increase furosemide to 80 mg every other day alternating with 40 mg every other day.  Obtain follow-up BMET when he returns to the atrial fibrillation clinic after his cardioversion.

## 2022-05-19 NOTE — Progress Notes (Signed)
Cardiology Office Note:    Date:  05/19/2022   ID:  Ronald [name], DOB 07/08/1945, MRN [id number]  PCP:  Glean Hess, MD  [place] [facility] Cardiologist:  Lauree Chandler, MD    Referring MD: Glean Hess, MD   Chief Complaint:  Follow-up for atrial fibrillation/flutter    Patient Profile: Coronary artery disease  s/p CABG in 1996 (LIMA-LAD, SVG-circumflex, SVG-RCA, SVG-diagonal)  s/p redo bypass in 2007 (SVG-OM, SVG-RCA) NSTEMI 11/2014 s/p DES to S-OM Intol of Brilinta 2/2 shortness of breath  [place] 09/2015 s/p DES to S-Dx Cath 07/28/2017: grafts patent (LIMA-LAD, Y graft from 1996-Dx/distal OM w P stent in the diagonal limb,S-RCA with patent proximal stent) Ischemic cardiomyopathy HFrEF (heart failure with reduced ejection fraction)  TTE 07/2016: EF 45-50  TTE 07/2017: EF 55-60 TTE 06/05/2018: EF 30-35, diffuse HK, mild MR, mild LAE, mild reduced RVSF Atrial fibrillation/flutter Amiodarone Rx  Eliquis Rx  PVCs Chronic kidney disease  Diabetes mellitus  Hypertension  Hyperlipidemia Abdominal aortic aneurysm  [place] 09/01/2020: 3.1 cm  Renal artery stenosis Chronic Obstructive Pulmonary Disease OSA on CPAP GERD     History of Present Illness:   Ronald [name] is a 76 y.o. male with the above problem list.  He was last seen 05/05/2022.  He was in atrial flutter.  I increased his amiodarone and brought him back today to arrange cardioversion if he has not already converted back to sinus rhythm.    He is here today with his wife.  Electrocardiogram continues to demonstrate atrial flutter.  He has not felt any better since he was last here.  He has not had chest pain.  He has shortness of breath with minimal activity.  He has been sleeping in a recliner.  He has had symptoms of PND in the recent weeks.  He has mild lower extremity edema.  This may have improved slightly with the increase Lasix for 3 days.  He notes insomnia over the last few weeks.  He has not  had syncope.    EKG: Atrial flutter, HR 81, QTc 476    Reviewed and updated this encounter:  Tobacco  Allergies  Meds  Problems  Med Hx  Surg Hx  Fam Hx     Review of Systems  Gastrointestinal:  Negative for hematochezia and melena.  Genitourinary:  Negative for hematuria.    Labs/Other Test Reviewed:   Recent Labs: 08/07/2021: TSH 2.395 10/01/2021: Hemoglobin 12.6; Platelets 175 11/24/2021: ALT 28; BUN 58; Creatinine, Ser 3.66; Potassium 5.5; Sodium 141   Recent Lipid Panel Recent Labs    11/24/21 1625  CHOL 153  TRIG 54  HDL 60  LDLCALC 82    Risk Assessment/Calculations/Metrics:    CHA2DS2-VASc Score = 6   This indicates a 9.7% annual risk of stroke. The patient's score is based upon: CHF History: 1 HTN History: 1 Diabetes History: 1 Stroke History: 0 Vascular Disease History: 1 Age Score: 2 Gender Score: 0            Physical Exam:   VS:  BP 118/62   Pulse 97   Ht '5\' 10"'$  (1.778 m)   Wt 246 lb 6.4 oz (111.8 kg)   SpO2 93%   BMI 35.35 kg/m    Wt Readings from Last 3 Encounters:  05/19/22 246 lb 6.4 oz (111.8 kg)  05/05/22 247 lb (112 kg)  04/06/22 241 lb (109.3 kg)    Constitutional:      Appearance: Healthy appearance. Not  in distress.  Neck:     Vascular: JVR present. JVD normal.  Pulmonary:     Effort: Pulmonary effort is normal.     Breath sounds: No wheezing. No rales.  Cardiovascular:     Normal rate. Regular rhythm. Normal S1. Normal S2.      Murmurs: There is no murmur.  Edema:    Peripheral edema present.    Pretibial: bilateral trace edema of the pretibial area. Abdominal:     Palpations: Abdomen is soft. There is no hepatomegaly.         ASSESSMENT & PLAN:   Paroxysmal atrial fibrillation (HCC) He pains and what looks like atrial flutter.  His heart rate is better at 81 on the higher dose of amiodarone.  He is significantly symptomatic with this.  I did ask him if he would like to proceed with cardioversion earlier than next  week.  However, he prefers to wait until 12/26.  Continue amiodarone 400 mg daily, Eliquis 5 mg twice daily.  Continue metoprolol tartrate 100 mg twice daily.  Proceed with cardioversion next week as planned.  Follow-up in the atrial fibrillation clinic afterward.  Follow-up with Dr. Angelena Form in 1 month.  HFrEF (heart failure with reduced ejection fraction) (HCC) EF 30-35 by echocardiogram in 2020.  He continues to have symptoms of heart failure in the setting of atrial flutter.  Obtain BMET, CBC today in preparation for his cardioversion.  Increase furosemide to 80 mg every other day alternating with 40 mg every other day.  Obtain follow-up BMET when he returns to the atrial fibrillation clinic after his cardioversion.  Coronary artery disease involving native coronary artery with angina pectoris ([place]) History of CABG in 1996 and redo bypass in 2007.  He underwent PCI with DES to the SVG-OM in 2016 in the setting of a non-STEMI and DES to the SVG-Dx in 2017 in the setting of unstable angina.  His last catheterization in 2019 demonstrated patent bypass grafts.  He is currently not having chest pain that is consistent with angina.  Continue amlodipine 5 mg daily, Lipitor 40 mg daily, Imdur 30 mg daily, metoprolol tartrate 100 mg twice daily, nitroglycerin as needed.  CKD (chronic kidney disease), stage IV ([place]) Obtain follow-up BMET today.  Repeat BMET at follow-up in the atrial fibrillation clinic after his cardioversion.        Shared Decision Making/Informed Consent The risks (stroke, cardiac arrhythmias rarely resulting in the need for a temporary or permanent pacemaker, skin irritation or burns and complications associated with conscious sedation including aspiration, arrhythmia, respiratory failure and death), benefits (restoration of normal sinus rhythm) and alternatives of a direct current cardioversion were explained in detail to Mr. Speyer and he agrees to proceed.     Dispo:  Return in  about 4 weeks (around 06/16/2022) for Routine Follow Up with Dr. Angelena Form.  Medication Adjustments/Labs and Tests Ordered: Current medicines are reviewed at length with the patient today.  Concerns regarding medicines are outlined above.  Tests Ordered: Orders Placed This Encounter  Procedures   Basic metabolic panel   CBC   EKG 12-Lead   Medication Changes: Meds ordered this encounter  Medications   furosemide (LASIX) 80 MG tablet    Sig: Take 1 tablet by mouth every other day alternating with 1/2 tablet every other day    Dispense:  90 tablet    Refill:  3   Signed, Richardson Dopp, PA-C  05/19/2022 5:15 PM     [facility] [address] [place], [place]  [address] Phone: [phone]; Fax: [phone]

## 2022-05-19 NOTE — Assessment & Plan Note (Signed)
Obtain follow-up BMET today.  Repeat BMET at follow-up in the atrial fibrillation clinic after his cardioversion.

## 2022-05-19 NOTE — Assessment & Plan Note (Signed)
History of CABG in 1996 and redo bypass in 2007.  He underwent PCI with DES to the SVG-OM in 2016 in the setting of a non-STEMI and DES to the SVG-Dx in 2017 in the setting of unstable angina.  His last catheterization in 2019 demonstrated patent bypass grafts.  He is currently not having chest pain that is consistent with angina.  Continue amlodipine 5 mg daily, Lipitor 40 mg daily, Imdur 30 mg daily, metoprolol tartrate 100 mg twice daily, nitroglycerin as needed.

## 2022-05-20 ENCOUNTER — Ambulatory Visit (INDEPENDENT_AMBULATORY_CARE_PROVIDER_SITE_OTHER): Payer: Medicare Other

## 2022-05-20 ENCOUNTER — Telehealth: Payer: Self-pay | Admitting: *Deleted

## 2022-05-20 ENCOUNTER — Ambulatory Visit (INDEPENDENT_AMBULATORY_CARE_PROVIDER_SITE_OTHER): Payer: Medicare Other | Admitting: Vascular Surgery

## 2022-05-20 ENCOUNTER — Encounter (INDEPENDENT_AMBULATORY_CARE_PROVIDER_SITE_OTHER): Payer: Self-pay | Admitting: Vascular Surgery

## 2022-05-20 VITALS — BP 106/58 | HR 91 | Resp 16 | Ht 70.0 in | Wt 246.0 lb

## 2022-05-20 DIAGNOSIS — I7121 Aneurysm of the ascending aorta, without rupture: Secondary | ICD-10-CM

## 2022-05-20 DIAGNOSIS — I701 Atherosclerosis of renal artery: Secondary | ICD-10-CM

## 2022-05-20 DIAGNOSIS — R0989 Other specified symptoms and signs involving the circulatory and respiratory systems: Secondary | ICD-10-CM

## 2022-05-20 DIAGNOSIS — I10 Essential (primary) hypertension: Secondary | ICD-10-CM

## 2022-05-20 DIAGNOSIS — R29898 Other symptoms and signs involving the musculoskeletal system: Secondary | ICD-10-CM

## 2022-05-20 DIAGNOSIS — Z79899 Other long term (current) drug therapy: Secondary | ICD-10-CM

## 2022-05-20 DIAGNOSIS — I70213 Atherosclerosis of native arteries of extremities with intermittent claudication, bilateral legs: Secondary | ICD-10-CM

## 2022-05-20 DIAGNOSIS — I714 Abdominal aortic aneurysm, without rupture, unspecified: Secondary | ICD-10-CM | POA: Diagnosis not present

## 2022-05-20 DIAGNOSIS — I25119 Atherosclerotic heart disease of native coronary artery with unspecified angina pectoris: Secondary | ICD-10-CM | POA: Diagnosis not present

## 2022-05-20 LAB — BASIC METABOLIC PANEL
BUN/Creatinine Ratio: 12 (ref 10–24)
BUN: 50 mg/dL — ABNORMAL HIGH (ref 8–27)
CO2: 23 mmol/L (ref 20–29)
Calcium: 9.4 mg/dL (ref 8.6–10.2)
Chloride: 105 mmol/L (ref 96–106)
Creatinine, Ser: 4.16 mg/dL — ABNORMAL HIGH (ref 0.76–1.27)
Glucose: 125 mg/dL — ABNORMAL HIGH (ref 70–99)
Potassium: 4.9 mmol/L (ref 3.5–5.2)
Sodium: 143 mmol/L (ref 134–144)
eGFR: 14 mL/min/{1.73_m2} — ABNORMAL LOW (ref >59)

## 2022-05-20 LAB — CBC
Hematocrit: 30.4 % — ABNORMAL LOW (ref 37.5–51.0)
Hemoglobin: 9.9 g/dL — ABNORMAL LOW (ref 13.0–17.7)
MCH: 28.5 pg (ref 26.6–33.0)
MCHC: 32.6 g/dL (ref 31.5–35.7)
MCV: 88 fL (ref 79–97)
Platelets: 152 10*3/uL (ref 150–450)
RBC: 3.47 x10E6/uL — ABNORMAL LOW (ref 4.14–5.80)
RDW: 14.8 % (ref 11.6–15.4)
WBC: 7 10*3/uL (ref 3.4–10.8)

## 2022-05-20 MED ORDER — FUROSEMIDE 40 MG PO TABS: 40.0000 mg | ORAL_TABLET | Freq: Every day | ORAL | 3 refills | Status: DC

## 2022-05-20 NOTE — Telephone Encounter (Signed)
-----   Message from Imogene Burn, PA-C sent at 05/20/2022  8:01 AM EST ----- Covering for [employer]. Crt up 4.16. please ask patient not to increase lasix as instructed yesterday. His lasix needs to be managed by renal with his rising creatinine. I've copied Dr. Charlestine Night you please help with this management?  Also anemic-could be related to CKD but ask patient if any bleeding? He's scheduled for cardioversion 05/25/22-should have repeat bmet and cbc that day

## 2022-05-20 NOTE — Telephone Encounter (Signed)
-----   Message from Liliane Shi, [place] sent at 05/19/2022  5:12 PM EST ----- Will you have him get a BMET when he follows up in the A-fib clinic?  CKD is the diagnosis

## 2022-05-25 ENCOUNTER — Ambulatory Visit (HOSPITAL_COMMUNITY): Payer: Medicare Other | Admitting: Anesthesiology

## 2022-05-25 ENCOUNTER — Ambulatory Visit (HOSPITAL_BASED_OUTPATIENT_CLINIC_OR_DEPARTMENT_OTHER): Payer: Medicare Other | Admitting: Anesthesiology

## 2022-05-25 ENCOUNTER — Ambulatory Visit (HOSPITAL_COMMUNITY)
Admission: RE | Admit: 2022-05-25 | Discharge: 2022-05-25 | Disposition: A | Discharge status: Home / Self Care | Payer: Medicare Other | Attending: Cardiology | Admitting: Cardiology

## 2022-05-25 ENCOUNTER — Encounter (HOSPITAL_COMMUNITY)
Admission: RE | Disposition: A | Discharge status: Home / Self Care | Payer: Self-pay | Source: Home / Self Care | Attending: Cardiology

## 2022-05-25 ENCOUNTER — Other Ambulatory Visit: Payer: Self-pay

## 2022-05-25 DIAGNOSIS — Z951 Presence of aortocoronary bypass graft: Secondary | ICD-10-CM | POA: Insufficient documentation

## 2022-05-25 DIAGNOSIS — Z7901 Long term (current) use of anticoagulants: Secondary | ICD-10-CM | POA: Diagnosis not present

## 2022-05-25 DIAGNOSIS — Z955 Presence of coronary angioplasty implant and graft: Secondary | ICD-10-CM | POA: Insufficient documentation

## 2022-05-25 DIAGNOSIS — I739 Peripheral vascular disease, unspecified: Secondary | ICD-10-CM | POA: Insufficient documentation

## 2022-05-25 DIAGNOSIS — E1122 Type 2 diabetes mellitus with diabetic chronic kidney disease: Secondary | ICD-10-CM | POA: Diagnosis not present

## 2022-05-25 DIAGNOSIS — N184 Chronic kidney disease, stage 4 (severe): Secondary | ICD-10-CM | POA: Insufficient documentation

## 2022-05-25 DIAGNOSIS — Z7984 Long term (current) use of oral hypoglycemic drugs: Secondary | ICD-10-CM | POA: Diagnosis not present

## 2022-05-25 DIAGNOSIS — Z79899 Other long term (current) drug therapy: Secondary | ICD-10-CM | POA: Diagnosis not present

## 2022-05-25 DIAGNOSIS — Z6835 Body mass index (BMI) 35.0-35.9, adult: Secondary | ICD-10-CM | POA: Insufficient documentation

## 2022-05-25 DIAGNOSIS — Z8719 Personal history of other diseases of the digestive system: Secondary | ICD-10-CM | POA: Diagnosis not present

## 2022-05-25 DIAGNOSIS — E669 Obesity, unspecified: Secondary | ICD-10-CM | POA: Insufficient documentation

## 2022-05-25 DIAGNOSIS — I130 Hypertensive heart and chronic kidney disease with heart failure and stage 1 through stage 4 chronic kidney disease, or unspecified chronic kidney disease: Secondary | ICD-10-CM | POA: Insufficient documentation

## 2022-05-25 DIAGNOSIS — I110 Hypertensive heart disease with heart failure: Secondary | ICD-10-CM

## 2022-05-25 DIAGNOSIS — I252 Old myocardial infarction: Secondary | ICD-10-CM | POA: Diagnosis not present

## 2022-05-25 DIAGNOSIS — I25119 Atherosclerotic heart disease of native coronary artery with unspecified angina pectoris: Secondary | ICD-10-CM | POA: Insufficient documentation

## 2022-05-25 DIAGNOSIS — I5020 Unspecified systolic (congestive) heart failure: Secondary | ICD-10-CM | POA: Diagnosis not present

## 2022-05-25 DIAGNOSIS — I509 Heart failure, unspecified: Secondary | ICD-10-CM

## 2022-05-25 DIAGNOSIS — I4891 Unspecified atrial fibrillation: Secondary | ICD-10-CM | POA: Diagnosis not present

## 2022-05-25 DIAGNOSIS — I4892 Unspecified atrial flutter: Secondary | ICD-10-CM | POA: Insufficient documentation

## 2022-05-25 DIAGNOSIS — K219 Gastro-esophageal reflux disease without esophagitis: Secondary | ICD-10-CM | POA: Insufficient documentation

## 2022-05-25 DIAGNOSIS — Z87891 Personal history of nicotine dependence: Secondary | ICD-10-CM

## 2022-05-25 DIAGNOSIS — J449 Chronic obstructive pulmonary disease, unspecified: Secondary | ICD-10-CM | POA: Insufficient documentation

## 2022-05-25 DIAGNOSIS — M1990 Unspecified osteoarthritis, unspecified site: Secondary | ICD-10-CM | POA: Diagnosis not present

## 2022-05-25 DIAGNOSIS — I480 Paroxysmal atrial fibrillation: Secondary | ICD-10-CM

## 2022-05-25 HISTORY — PX: CARDIOVERSION: SHX1299

## 2022-05-25 LAB — BASIC METABOLIC PANEL
Anion gap: 7 (ref 5–15)
BUN: 56 mg/dL — ABNORMAL HIGH (ref 8–23)
CO2: 25 mmol/L (ref 22–32)
Calcium: 9.3 mg/dL (ref 8.9–10.3)
Chloride: 107 mmol/L (ref 98–111)
Creatinine, Ser: 4.23 mg/dL — ABNORMAL HIGH (ref 0.61–1.24)
GFR, Estimated: 14 mL/min — ABNORMAL LOW (ref >60)
Glucose, Bld: 141 mg/dL — ABNORMAL HIGH (ref 70–99)
Potassium: 5.5 mmol/L — ABNORMAL HIGH (ref 3.5–5.1)
Sodium: 139 mmol/L (ref 135–145)

## 2022-05-25 LAB — CBC
HCT: 31.1 % — ABNORMAL LOW (ref 39.0–52.0)
Hemoglobin: 9.6 g/dL — ABNORMAL LOW (ref 13.0–17.0)
MCH: 28.5 pg (ref 26.0–34.0)
MCHC: 30.9 g/dL (ref 30.0–36.0)
MCV: 92.3 fL (ref 80.0–100.0)
Platelets: 134 10*3/uL — ABNORMAL LOW (ref 150–400)
RBC: 3.37 MIL/uL — ABNORMAL LOW (ref 4.22–5.81)
RDW: 16.3 % — ABNORMAL HIGH (ref 11.5–15.5)
WBC: 8.5 10*3/uL (ref 4.0–10.5)
nRBC: 0 % (ref 0.0–0.2)

## 2022-05-25 SURGERY — CARDIOVERSION
Anesthesia: General

## 2022-05-25 MED ORDER — SODIUM CHLORIDE 0.9 % IV SOLN: INTRAVENOUS | Status: DC

## 2022-05-25 MED ORDER — LIDOCAINE 2% (20 MG/ML) 5 ML SYRINGE
INTRAMUSCULAR | Status: DC | PRN
  Administered 2022-05-25: 50 mg via INTRAVENOUS

## 2022-05-25 MED ORDER — PROPOFOL 10 MG/ML IV BOLUS
INTRAVENOUS | Status: DC | PRN
  Administered 2022-05-25: 50 mg via INTRAVENOUS

## 2022-05-25 NOTE — Interval H&P Note (Signed)
History and Physical Interval Note:  05/25/2022 9:08 AM  [name]  has presented today for surgery, with the diagnosis of ATRIAL FIBRILLATION.  The various methods of treatment have been discussed with the patient and family. After consideration of risks, benefits and other options for treatment, the patient has consented to  Procedure(s): CARDIOVERSION (N/A) as a surgical intervention.  The patient's history has been reviewed, patient examined, no change in status, stable for surgery.  I have reviewed the patient's chart and labs.  Questions were answered to the patient's satisfaction.     Ronald [name]

## 2022-05-25 NOTE — Discharge Instructions (Signed)

## 2022-05-25 NOTE — Anesthesia Postprocedure Evaluation (Signed)
Anesthesia Post Note  Patient: Ronald [name] [facility]  Procedure(s) Performed: CARDIOVERSION     Patient location during evaluation: PACU Anesthesia Type: General Level of consciousness: awake and alert and oriented Pain management: pain level controlled Vital Signs Assessment: post-procedure vital signs reviewed and stable Respiratory status: spontaneous breathing, nonlabored ventilation and respiratory function stable Cardiovascular status: stable and blood pressure returned to baseline Postop Assessment: no apparent nausea or vomiting Anesthetic complications: no   No notable events documented.  Last Vitals:  Vitals:   05/25/22 0936 05/25/22 0940  BP:  (!) 121/58  Pulse: (!) 52 (!) 52  Resp: 12 18  Temp: 36.5 C   SpO2: 92% 91%    Last Pain:  Vitals:   05/25/22 0940  TempSrc:   PainSc: 0-No pain                 Nakisha Chai A.

## 2022-05-25 NOTE — Transfer of Care (Signed)
Immediate Anesthesia Transfer of Care Note  Patient: Ronald [name] [facility]  Procedure(s) Performed: CARDIOVERSION  Patient Location: PACU and Endoscopy Unit  Anesthesia Type:General  Level of Consciousness: drowsy, patient cooperative, and responds to stimulation  Airway & Oxygen Therapy: Patient Spontanous Breathing  Post-op Assessment: Report given to RN and Post -op Vital signs reviewed and stable  Post vital signs: Reviewed and stable  Last Vitals:  Vitals Value Taken Time  BP    Temp    Pulse    Resp    SpO2      Last Pain:  Vitals:   05/25/22 0904  TempSrc: Temporal  PainSc: 0-No pain         Complications: No notable events documented.

## 2022-05-25 NOTE — CV Procedure (Signed)
   Electrical Cardioversion Procedure Note Demetri [facility] [id number] 1945-11-21  Procedure: Electrical Cardioversion Indications:  Atrial Flutter  Time Out: Verified patient identification, verified procedure,medications/allergies/relevent history reviewed, required imaging and test results available.  Performed  Procedure Details  The patient signed informed consent.   The patient was NPO past midnight. Has had therapeutic anticoagulation with Eliquis greater than 3 weeks. The patient denies any interruption of anticoagulation.  Anesthesia was administered by Dr. Royce Macadamia.  Adequate airway was maintained throughout and vital followed per protocol.  He was cardioverted x 1 with 200J of biphasic synchronized energy.  He converted to NSR.  There were no apparent complications.  The patient tolerated the procedure well and had normal neuro status and respiratory status post procedure with vitals stable as recorded elsewhere.     IMPRESSION:  Successful cardioversion of atrial fibrillation to sinus bradycardia   Follow up:  We will arrange follow up with primary cardiologist.  He will continue on current medical therapy.  The patient advised to continue anticoagulation.  Shajuana Mclucas 05/25/2022, 9:34 AM

## 2022-05-25 NOTE — Anesthesia Procedure Notes (Signed)
Procedure Name: General with mask airway Date/Time: 05/25/2022 9:25 AM  Performed by: Janace Litten, CRNAPre-anesthesia Checklist: Patient identified, Emergency Drugs available, Suction available, Patient being monitored and Timeout performed Patient Re-evaluated:Patient Re-evaluated prior to induction Oxygen Delivery Method: Ambu bag Preoxygenation: Pre-oxygenation with 100% oxygen Induction Type: IV induction

## 2022-05-25 NOTE — Anesthesia Preprocedure Evaluation (Addendum)
Anesthesia Evaluation  Patient identified by MRN, date of birth, ID band Patient awake    Reviewed: Allergy & Precautions, NPO status , Patient's Chart, lab work & pertinent test results, reviewed documented beta blocker date and time   Airway Mallampati: II  TM Distance: >3 FB Neck ROM: Full    Dental  (+) Poor Dentition, Missing, Dental Advisory Given   Pulmonary COPD,  COPD inhaler, former smoker   breath sounds clear to auscultation + decreased breath sounds      Cardiovascular hypertension, + angina with exertion and at rest + CAD, + Past MI, + Peripheral Vascular Disease, +CHF and + DOE   Rhythm:Regular Rate:Normal  Echo 06/15/18 Left ventricle: The cavity size was moderately dilated. Systolic    function was moderately to severely reduced. The estimated    ejection fraction was in the range of 30% to 35%. Diffuse    hypokinesis. The study is not technically sufficient to allow    evaluation of LV diastolic function.  - Mitral valve: There was mild regurgitation.  - Left atrium: The atrium was mildly dilated.  - Right ventricle: The cavity size was mildly dilated. Wall    thickness was normal. Systolic function was mildly reduced.   EKG 05/25/22 NSR, RAD, Non specific IVCD  EKG 05/19/22 NSR, Incomplete LBBB pattern, inverted T wave inferior leads   Neuro/Psych negative neurological ROS  negative psych ROS   GI/Hepatic Neg liver ROS, hiatal hernia,GERD  Medicated and Controlled,,  Endo/Other  diabetes, Well Controlled, Type 2, Oral Hypoglycemic Agents    Renal/GU Renal InsufficiencyRenal disease  negative genitourinary   Musculoskeletal  (+) Arthritis , Osteoarthritis,    Abdominal  (+) + obese  Peds  Hematology  (+) Blood dyscrasia, anemia Eliquis therapy- last dose this am   Anesthesia Other Findings   Reproductive/Obstetrics                              Anesthesia  Physical Anesthesia Plan  ASA: 3  Anesthesia Plan: MAC   Post-op Pain Management: Minimal or no pain anticipated   Induction: Intravenous  PONV Risk Score and Plan: 1 and Treatment may vary due to age or medical condition and Propofol infusion  Airway Management Planned: Natural Airway and Nasal Cannula  Additional Equipment: None  Intra-op Plan:   Post-operative Plan:   Informed Consent: I have reviewed the patients History and Physical, chart, labs and discussed the procedure including the risks, benefits and alternatives for the proposed anesthesia with the patient or authorized representative who has indicated his/her understanding and acceptance.     Dental advisory given  Plan Discussed with: CRNA and Anesthesiologist  Anesthesia Plan Comments:          Anesthesia Quick Evaluation

## 2022-05-26 ENCOUNTER — Telehealth: Payer: Self-pay | Admitting: Physician Assistant

## 2022-05-26 NOTE — Telephone Encounter (Signed)
Recent Labs    05/25/22 0831 05/25/22 0843  K  --  5.5*  BUN  --  56*  CREATININE  --  4.23*  HGB 9.6*  --     Labs from yesterday at [facility] reviewed. K+ is high. Creatinine remains elevated. Hgb stable.  PLAN:  Hold Lasix Hold Avapro Avoid/eliminate dietary K+ Repeat BMET Friday AM - run stat Send labs to nephrologist.  Richardson Dopp, PA-C    05/26/2022 1:06 PM

## 2022-05-26 NOTE — Telephone Encounter (Signed)
[name] returned call to discuss results and Ronald Dopp, PA orders.  She is aware to hold the Lasix and Avapro.  Reviewed K+ containing foods to avoid including salt substitutes.  She reports understanding. While speaking with wife she reports pt is having chest tightness and sharp center of chest, near his ribs "stomach pain" while feels like his previous pain experienced prior to CABG.  He states it is pretty much constant but worse it night.  He c/o SOB that wakes him from his sleep.  The chest tightness/pain nor SOB are worse with activity.  No N/V, diaphoresis.  02 sat 94% HR 55 currently.  He has not tried SL Ntg.  Thought the discomfort would be better after his cardioversion but no improvement.  Pt aware to report to ED if s/s worsen and SL Ntg does not relieve discomfort.  Appt scheduled with Dr Ronald [name] 12/28 for further evaluation.  Wife and pt appreciative of the call information and appt.

## 2022-05-26 NOTE — Telephone Encounter (Signed)
Left message on VM (ok per DPR) to call back to discuss recent lab results and needed changes.

## 2022-05-26 NOTE — Progress Notes (Deleted)
Cardiology Office Note:    Date:  05/26/2022   ID:  Ronald [name], DOB 12-31-1945, MRN [id number]  PCP:  Glean Hess, MD   [place] Prov[facility] Cardiologist:  Lauree Chandler, MD {    Referring MD: Glean Hess, MD    History of Present Illness:    Ronald [name] is a 76 y.o. male with a hx of CAD s/p CABG in 1996 (LIMA-LAD, SVG-circumflex, SVG-RCA, SVG-diagonal) with redo bypass in 2007 (SVG-OM, SVG-RCA), ischemic cardiomyopathy, CKD, DM 2, HTN, HLD, AAA, renal artery stenosis, COPD, GERD, atrial fibrillation and PVCs who presents to clinic as urgent visit for chest pain.  Per review of the record, the patient was hospitalized at [facility] July 2016 with a NSTEMI. Cardiac cath with severe disease in the SVG to OM treated with a drug eluting stent. He was treated with Brilinta post PCI but developed dyspnea and was changed to Plavix. He was admitted to [facility] May 2017 with unstable angina and cardiac cath showed severe disease in the distal body/anastomosis of the SVG to Diagonal.  A drug eluting stent was placed in this vein graft. The LIMA to LAD was patent, SVG to OM was patent, SVG to PDA/PLA was patent. He was admitted to [facility] February 2018 with chest pain. Troponin was negative x 3. He did not have an ischemic evaluation. Echo February 2018 with  mild LV systolic dysfunction. LVEF=45-50%. He was admitted to [facility] 07/2017 with unstable angina and cardiac cath showed patency of all of the bypass grafts. TTE 07/2017 with normal LV systolic function, JQZE=09-23%, grade 1 diastolic dysfunction. He was admitted to [facility] January 2020 with atrial fib/flutter with RVR. Started on Xarelto and initially rate controlled with IV Cardizem. He was loaded with IV amiodarone and discharged home on po amiodarone and Lopressor (Coreg stopped during admission). His Plavix and Norvasc were stopped at discharge. His Norvasc was restarted in primary care. Echo January 2020 with LVEF=30-35%. Mild  MR. Abdominal u/s April 2022 with 3.1 cm AAA, stable.    On 05/25/22, the patient underwent DCCV with 200J x1 with return to NSR.   Called clinic on 05/26/22 with chest pain prompting urgent visit today.  Today, ***  Past Medical History:  Diagnosis Date   AAA (abdominal aortic aneurysm) ([place])    a. 3cm by [place] 2015.   Arthritis    "hips; back" (12/13/2014)   CAD (coronary artery disease) 2007   a. s/p CABG- IMA-LAD, VG-Cx, VG-RCA, VG-diag in 1999. B. sp redo CABG- VG-OM, VG-RCA in 2007 due to VG disease. c. NSTEMI 11/2014 s/p DES to SVG-OM from the Y graft.d. PTCA/DES x 1 distal body of SVG to Diagonal.09/2015   Chronic combined systolic and diastolic CHF (congestive heart failure) ([place])    a. remote EF 40-45% in 2006. b. Normal EF 2014. b. Echo 07/2016 EF 45-50%, grade 1 DD. c. Echo 2020 30% to 35%. Diffuse EF 30-35%, diffuse hypokinesis.   Chronic lower back pain    CKD (chronic kidney disease), stage IV (HCC)    COPD (chronic obstructive pulmonary disease) (HCC)    Deafness in left ear    Degenerative disc disease, lumbar    Diabetes mellitus, type 2 ([place]) 10/04/2014   Microalbumin 05/11/2012-100. Foot exam/monofilament 05/11/2012-normal.   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 noted on CT 2008.   Type II diabetes mellitus ([place])    Diet control    Unstable angina ([place]) 07/27/2017   Walking pneumonia 1990's   Wears dentures    full upper    Past Surgical History:  Procedure Laterality Date    CARDIAC CATHETERIZATION  "several"   CARDIAC CATHETERIZATION N/A 12/13/2014   Procedure: Left Heart Cath and Coronary Angiography;  Surgeon: Jettie Booze, MD;  Location: [place] [facility];  Service: Cardiovascular;  Laterality: N/A;   CARDIAC CATHETERIZATION  12/13/2014   Procedure: Coronary Stent Intervention;  Surgeon: Jettie Booze, MD;  Location: [place] [facility];  Service: Cardiovascular;;   CARDIAC CATHETERIZATION N/A 10/07/2015   Procedure: Left Heart Cath and Cors/Grafts Angiography;  Surgeon: Burnell Blanks, MD;  Location: [place] [facility];  Service: Cardiovascular;  Laterality: N/A;   CARDIAC CATHETERIZATION N/A 10/07/2015   Procedure: Coronary Stent Intervention;  Surgeon: Burnell Blanks, MD;  Location: [place] [facility];  Service: Cardiovascular;  Laterality: N/A;   CATARACT EXTRACTION W/PHACO Left 10/21/2021   Procedure: CATARACT EXTRACTION PHACO AND INTRAOCULAR LENS PLACEMENT (IOC) LEFT 3.94 00:33.7;  Surgeon: Eulogio Bear, MD;  Location: [place];  Service: Ophthalmology;  Laterality: Left;   CATARACT EXTRACTION W/PHACO Right 11/09/2021   Procedure: CATARACT EXTRACTION PHACO AND INTRAOCULAR LENS PLACEMENT (IOC) RIGHT;  Surgeon: Eulogio Bear, MD;  Location: [place];  Service: Ophthalmology;  Laterality: Right;  3.59 0:29.4   CORONARY ANGIOPLASTY  "several"   CORONARY ANGIOPLASTY WITH STENT PLACEMENT  2005; 12/13/2014   "2; 1"   CORONARY ARTERY BYPASS GRAFT  1996   CABG X5   CORONARY ARTERY BYPASS GRAFT  March 2007   CABG X3   ESOPHAGOGASTRODUODENOSCOPY (EGD) WITH ESOPHAGEAL DILATION  2000   GREEN LIGHT LASER TURP (TRANSURETHRAL RESECTION OF PROSTATE  2000's   "not cancerous"   HERNIA REPAIR     LAPAROSCOPIC CHOLECYSTECTOMY     LEFT HEART CATH AND CORS/GRAFTS ANGIOGRAPHY N/A 07/28/2017   Procedure: LEFT HEART CATH AND CORS/GRAFTS ANGIOGRAPHY;  Surgeon: Troy Sine, MD;  Location: [place] [facility];  Service:  Cardiovascular;  Laterality: N/A;   LUNG SURGERY  1996   "S/P CABG, had to put staple in lung after it had collapsed"   UMBILICAL HERNIA REPAIR     w/chole    Current Medications: No outpatient medications have been marked as taking for the 05/27/22 encounter (Appointment) with Freada Bergeron, MD.     Allergies:   Predicort [prednisolone], Ciprofloxacin, Hydrochlorothiazide, Hydrocodone, Hydrocodone-acetaminophen, Sulfa antibiotics, Hydrocodone-acetaminophen, and Penicillins   Social History   Socioeconomic History   Marital status: Married    Spouse name: Not on file   Number of children: 2   Years of education: Not on file   Highest education level: 8th grade  Occupational History   Occupation: Retired  Tobacco Use   Smoking status: Former    Packs/day: 3.00    Years: 20.00    Total pack years: 60.00    Types: Cigarettes    Quit date: 07/25/1986    Years since quitting: 35.8   Smokeless  tobacco: Never  Vaping Use   Vaping Use: Never used  Substance and Sexual Activity   Alcohol use: No    Alcohol/week: 0.0 standard drinks of alcohol   Drug use: No   Sexual activity: Not Currently  Other Topics Concern   Not on file  Social History Narrative   Did auto salvage work.   Lives at home with his wife.  Independent at baseline.   Social Determinants of Health   Financial Resource Strain: Low Risk  (10/28/2021)   Overall Financial Resource Strain (CARDIA)    Difficulty of Paying Living Expenses: Not hard at all  Food Insecurity: No Food Insecurity (10/28/2021)   Hunger Vital Sign    Worried About Running Out of Food in the Last Year: Never true    Ran Out of Food in the Last Year: Never true  Transportation Needs: No Transportation Needs (10/28/2021)   PRAPARE - Hydrologist (Medical): No    Lack of Transportation (Non-Medical): No  Physical Activity: Inactive (04/28/2020)   Exercise Vital Sign    Days of Exercise per Week: 0 days     Minutes of Exercise per Session: 0 min  Stress: No Stress Concern Present (10/28/2021)   [place]    Feeling of Stress : Not at all  Social Connections: Moderately Isolated (10/28/2021)   Social Connection and Isolation Panel [NHANES]    Frequency of Communication with Friends and Family: Twice a week    Frequency of Social Gatherings with Friends and Family: Twice a week    Attends Religious Services: Never    Marine scientist or Organizations: No    Attends Music therapist: Never    Marital Status: Married     Family History: The patient's ***family history includes Asthma in his mother; Heart attack in his mother; Heart disease in his father and mother; Hyperlipidemia in his mother; Hypertension in his mother; Rheumatic fever in his father; Stroke in his mother. There is no history of Colon cancer.  ROS:   Please see the history of present illness.    *** All other systems reviewed and are negative.  EKGs/Labs/Other Studies Reviewed:    The following studies were reviewed today: ***  EKG:  EKG is *** ordered today.  The ekg ordered today demonstrates ***  Recent Labs: 08/07/2021: TSH 2.395 11/24/2021: ALT 28 05/25/2022: BUN 56; Creatinine, Ser 4.23; Hemoglobin 9.6; Platelets 134; Potassium 5.5; Sodium 139  Recent Lipid Panel    Component Value Date/Time   CHOL 153 11/24/2021 1625   TRIG 54 11/24/2021 1625   HDL 60 11/24/2021 1625   CHOLHDL 2.6 11/24/2021 1625   CHOLHDL 4.5 12/13/2014 0423   VLDL 25 12/13/2014 0423   LDLCALC 82 11/24/2021 1625     Risk Assessment/Calculations:   {Does this patient have ATRIAL FIBRILLATION?:[phone]}            Physical Exam:    VS:  There were no vitals taken for this visit.    Wt Readings from Last 3 Encounters:  05/20/22 246 lb (111.6 kg)  05/19/22 246 lb 6.4 oz (111.8 kg)  05/05/22 247 lb (112 kg)     GEN: *** Well nourished, well  developed in no acute distress HEENT: Normal NECK: No JVD; No carotid bruits LYMPHATICS: No lymphadenopathy CARDIAC: ***RRR, no murmurs, rubs, gallops RESPIRATORY:  Clear to auscultation without rales, wheezing or rhonchi  ABDOMEN: Soft, non-tender, non-distended MUSCULOSKELETAL:  No edema; No deformity  SKIN: Warm and dry NEUROLOGIC:  Alert and oriented x 3 PSYCHIATRIC:  Normal affect   ASSESSMENT:    No diagnosis found. PLAN:    In order of problems listed above:  #Chest Pain: #Multivessel CAD s/p CABG in with 1996 with re-do in 2007: Patient with extensive history of CAD s/p initial CABG in 1996 and re-do in 2007. Last cath in 2019 showed patent grafts. Currently, he is *** -Plan for PET as patient has severe renal dysfunction and would be high risk of ARF with contrast -Continue lipitor '40mg'$  daily -Continue imdur '30mg'$  daily -Not on ASA/plavix due to need for DAPT  #Paroxysmal Afib/flutter: S/p DCCV on 05/25/22 with return to NSR. Currently, ** -Continue amiodarone -Continue apixaban '5mg'$  BID  #Chronic Systolic HF: TTE with LVEF 30-35%, mild MR. Currently *** -GDMT held due to worsening lasix and irbesartan  #AKI on CKD: Cr up to 4.23. -Holding lasix, irbesartan       {Are you ordering a CV Procedure (e.g. stress test, cath, DCCV, TEE, etc)?   Press F2        :[id number]}    Medication Adjustments/Labs and Tests Ordered: Current medicines are reviewed at length with the patient today.  Concerns regarding medicines are outlined above.  No orders of the defined types were placed in this encounter.  No orders of the defined types were placed in this encounter.   There are no Patient Instructions on file for this visit.   Signed, Freada Bergeron, MD  05/26/2022 8:29 PM    [place]

## 2022-05-27 ENCOUNTER — Ambulatory Visit: Payer: Medicare Other | Attending: Cardiology | Admitting: Nurse Practitioner

## 2022-05-27 ENCOUNTER — Encounter: Payer: Self-pay | Admitting: Nurse Practitioner

## 2022-05-27 ENCOUNTER — Ambulatory Visit: Payer: Medicare Other | Admitting: Cardiology

## 2022-05-27 VITALS — BP 126/68 | HR 56 | Ht 70.0 in | Wt 248.2 lb

## 2022-05-27 DIAGNOSIS — I480 Paroxysmal atrial fibrillation: Secondary | ICD-10-CM | POA: Diagnosis not present

## 2022-05-27 DIAGNOSIS — Z79899 Other long term (current) drug therapy: Secondary | ICD-10-CM | POA: Diagnosis not present

## 2022-05-27 DIAGNOSIS — I25119 Atherosclerotic heart disease of native coronary artery with unspecified angina pectoris: Secondary | ICD-10-CM | POA: Diagnosis not present

## 2022-05-27 DIAGNOSIS — I5042 Chronic combined systolic (congestive) and diastolic (congestive) heart failure: Secondary | ICD-10-CM

## 2022-05-27 NOTE — Progress Notes (Incomplete)
Cardiology Office Note:    Date:  05/27/2022   ID:  Ronald [name], DOB 1946-04-21, MRN [id number]  PCP:  Glean Hess, MD   [facility] Cardiologist:  Lauree Chandler, MD {    Referring MD: Glean Hess, MD    History of Present Illness:    Ronald [name] is a 76 y.o. male with a hx of CAD s/p CABG in 1996 (LIMA-LAD, SVG-circumflex, SVG-RCA, SVG-diagonal) with redo bypass in 2007 (SVG-OM, SVG-RCA), ischemic cardiomyopathy, CKD, DM 2, HTN, HLD, AAA, renal artery stenosis, COPD, GERD, atrial fibrillation and PVCs who presents to clinic as urgent visit for chest pain.  Per review of the record, the patient was hospitalized at [facility] July 2016 with a NSTEMI. Cardiac cath with severe disease in the SVG to OM treated with a drug eluting stent. He was treated with Brilinta post PCI but developed dyspnea and was changed to Plavix. He was admitted to [facility] May 2017 with unstable angina and cardiac cath showed severe disease in the distal body/anastomosis of the SVG to Diagonal.  A drug eluting stent was placed in this vein graft. The LIMA to LAD was patent, SVG to OM was patent, SVG to PDA/PLA was patent. He was admitted to [facility] February 2018 with chest pain. Troponin was negative x 3. He did not have an ischemic evaluation. Echo February 2018 with  mild LV systolic dysfunction. LVEF=45-50%. He was admitted to [facility] 07/2017 with unstable angina and cardiac cath showed patency of all of the bypass grafts. TTE 07/2017 with normal LV systolic function, QAST=41-96%, grade 1 diastolic dysfunction. He was admitted to [facility] January 2020 with atrial fib/flutter with RVR. Started on Xarelto and initially rate controlled with IV Cardizem. He was loaded with IV amiodarone and discharged home on po amiodarone and Lopressor (Coreg stopped during admission). His Plavix and Norvasc were stopped at discharge. His Norvasc was restarted in primary care. Echo January 2020 with LVEF=30-35%. Mild  MR. Abdominal u/s April 2022 with 3.1 cm AAA, stable.    On 05/25/22, the patient underwent DCCV with 200J x1 with return to NSR.   Called clinic on 05/26/22 with chest pain prompting urgent visit today.  Today, ***  He denies any palpitations, chest pain, shortness of breath, or peripheral edema. No lightheadedness, headaches, syncope, orthopnea, or PND.   Past Medical History:  Diagnosis Date   AAA (abdominal aortic aneurysm) ([place])    a. 3cm by [place] 2015.   Arthritis    "hips; back" (12/13/2014)   CAD (coronary artery disease) 2007   a. s/p CABG- IMA-LAD, VG-Cx, VG-RCA, VG-diag in 1999. B. sp redo CABG- VG-OM, VG-RCA in 2007 due to VG disease. c. NSTEMI 11/2014 s/p DES to SVG-OM from the Y graft.d. PTCA/DES x 1 distal body of SVG to Diagonal.09/2015   Chronic combined systolic and diastolic CHF (congestive heart failure) ([place])    a. remote EF 40-45% in 2006. b. Normal EF 2014. b. Echo 07/2016 EF 45-50%, grade 1 DD. c. Echo 2020 30% to 35%. Diffuse EF 30-35%, diffuse hypokinesis.   Chronic lower back pain    CKD (chronic kidney disease), stage IV (HCC)    COPD (chronic obstructive pulmonary disease) (HCC)    Deafness in left ear    Degenerative disc disease, lumbar    Diabetes mellitus, type 2 ([place]) 10/04/2014   Microalbumin 05/11/2012-100. Foot exam/monofilament 05/11/2012-normal.   Dilated cardiomyopathy ([place]) 10/07/2015   Emphysema    Esophageal stricture 07/02/1998   EGD  Genital candidiasis in male 10/25/2012   GERD (gastroesophageal reflux disease)    History of gout    "last flareup was in 2007" (12/13/2014)   History of hiatal hernia    Hyperlipidemia    Hypertension    Ischemic cardiomyopathy 2006   Echo 2020: EF 30-35%, diffuse hypokinesis   NSTEMI (non-ST elevated myocardial infarction) ([place]) 12/13/2014   PVC's (premature ventricular contractions)    Renal artery stenosis ([place])    a. noted on CT 2008.   Type II diabetes mellitus ([place])    Diet control    Unstable  angina ([place]) 07/27/2017   Walking pneumonia 1990's   Wears dentures    full upper    Past Surgical History:  Procedure Laterality Date   CARDIAC CATHETERIZATION  "several"   CARDIAC CATHETERIZATION N/A 12/13/2014   Procedure: Left Heart Cath and Coronary Angiography;  Surgeon: Jettie Booze, MD;  Location: [place] [facility];  Service: Cardiovascular;  Laterality: N/A;   CARDIAC CATHETERIZATION  12/13/2014   Procedure: Coronary Stent Intervention;  Surgeon: Jettie Booze, MD;  Location: [place] [facility];  Service: Cardiovascular;;   CARDIAC CATHETERIZATION N/A 10/07/2015   Procedure: Left Heart Cath and Cors/Grafts Angiography;  Surgeon: Burnell Blanks, MD;  Location: [place] [facility];  Service: Cardiovascular;  Laterality: N/A;   CARDIAC CATHETERIZATION N/A 10/07/2015   Procedure: Coronary Stent Intervention;  Surgeon: Burnell Blanks, MD;  Location: [place] [facility];  Service: Cardiovascular;  Laterality: N/A;   CATARACT EXTRACTION W/PHACO Left 10/21/2021   Procedure: CATARACT EXTRACTION PHACO AND INTRAOCULAR LENS PLACEMENT (IOC) LEFT 3.94 00:33.7;  Surgeon: Eulogio Bear, MD;  Location: [place];  Service: Ophthalmology;  Laterality: Left;   CATARACT EXTRACTION W/PHACO Right 11/09/2021   Procedure: CATARACT EXTRACTION PHACO AND INTRAOCULAR LENS PLACEMENT (IOC) RIGHT;  Surgeon: Eulogio Bear, MD;  Location: [place];  Service: Ophthalmology;  Laterality: Right;  3.59 0:29.4   CORONARY ANGIOPLASTY  "several"   CORONARY ANGIOPLASTY WITH STENT PLACEMENT  2005; 12/13/2014   "2; 1"   CORONARY ARTERY BYPASS GRAFT  1996   CABG X5   CORONARY ARTERY BYPASS GRAFT  March 2007   CABG X3   ESOPHAGOGASTRODUODENOSCOPY (EGD) WITH ESOPHAGEAL DILATION  2000   GREEN LIGHT LASER TURP (TRANSURETHRAL RESECTION OF PROSTATE  2000's   "not cancerous"   HERNIA REPAIR     LAPAROSCOPIC CHOLECYSTECTOMY     LEFT HEART CATH AND CORS/GRAFTS ANGIOGRAPHY N/A  07/28/2017   Procedure: LEFT HEART CATH AND CORS/GRAFTS ANGIOGRAPHY;  Surgeon: Troy Sine, MD;  Location: [place] [facility];  Service: Cardiovascular;  Laterality: N/A;   LUNG SURGERY  1996   "S/P CABG, had to put staple in lung after it had collapsed"   UMBILICAL HERNIA REPAIR     w/chole    Current Medications: No outpatient medications have been marked as taking for the 05/27/22 encounter (Appointment) with Freada Bergeron, MD.     Allergies:   Predicort [prednisolone], Ciprofloxacin, Hydrochlorothiazide, Hydrocodone, Hydrocodone-acetaminophen, Sulfa antibiotics, Hydrocodone-acetaminophen, and Penicillins   Social History   Socioeconomic History   Marital status: Married    Spouse name: Not on file   Number of children: 2   Years of education: Not on file   Highest education level: 8th grade  Occupational History   Occupation: Retired  Tobacco Use   Smoking status: Former    Packs/day: 3.00    Years: 20.00    Total pack years: 60.00  Types: Cigarettes    Quit date: 07/25/1986    Years since quitting: 35.8   Smokeless tobacco: Never  Vaping Use   Vaping Use: Never used  Substance and Sexual Activity   Alcohol use: No    Alcohol/week: 0.0 standard drinks of alcohol   Drug use: No   Sexual activity: Not Currently  Other Topics Concern   Not on file  Social History Narrative   Did auto salvage work.   Lives at home with his wife.  Independent at baseline.   Social Determinants of Health   Financial Resource Strain: Low Risk  (10/28/2021)   Overall Financial Resource Strain (CARDIA)    Difficulty of Paying Living Expenses: Not hard at all  Food Insecurity: No Food Insecurity (10/28/2021)   Hunger Vital Sign    Worried About Running Out of Food in the Last Year: Never true    Ran Out of Food in the Last Year: Never true  Transportation Needs: No Transportation Needs (10/28/2021)   PRAPARE - Hydrologist (Medical): No    Lack  of Transportation (Non-Medical): No  Physical Activity: Inactive (04/28/2020)   Exercise Vital Sign    Days of Exercise per Week: 0 days    Minutes of Exercise per Session: 0 min  Stress: No Stress Concern Present (10/28/2021)   [place]    Feeling of Stress : Not at all  Social Connections: Moderately Isolated (10/28/2021)   Social Connection and Isolation Panel [NHANES]    Frequency of Communication with Friends and Family: Twice a week    Frequency of Social Gatherings with Friends and Family: Twice a week    Attends Religious Services: Never    Marine scientist or Organizations: No    Attends Music therapist: Never    Marital Status: Married     Family History: The patient's ***family history includes Asthma in his mother; Heart attack in his mother; Heart disease in his father and mother; Hyperlipidemia in his mother; Hypertension in his mother; Rheumatic fever in his father; Stroke in his mother. There is no history of Colon cancer.  ROS:   Review of Systems  Constitutional:  Negative for chills and fever.  HENT:  Negative for hearing loss and tinnitus.   Eyes:  Negative for blurred vision and double vision.  Respiratory:  Negative for cough and hemoptysis.   Cardiovascular:  Negative for chest pain, palpitations, orthopnea, claudication, leg swelling and PND.  Gastrointestinal:  Negative for heartburn and nausea.  Genitourinary:  Negative for dysuria and urgency.  Musculoskeletal:  Negative for myalgias and neck pain.  Neurological:  Negative for dizziness and headaches.  Endo/Heme/Allergies:  Negative for environmental allergies and polydipsia.  Psychiatric/Behavioral:  Negative for depression and suicidal ideas.      EKGs/Labs/Other Studies Reviewed:    The following studies were reviewed today: VAS [place] AAA 09/01/2020: Summary:  Abdominal Aorta: There is evidence of abnormal dilatation  of the mid  Abdominal aorta. Atherosclerosis noted throughout aorta. The largest  aortic diameter remains essentially unchanged compared to prior exam.  Previous diameter measurement was 3.1 cm  obtained on 08/17/2019.  Stenosis: +--------------------+-------------+  Location            Stenosis       +--------------------+-------------+  Right Common Iliac  >50% stenosis  +--------------------+-------------+  Left Common Iliac   >50% stenosis  +--------------------+-------------+  Right External Iliac<50% stenosis  +--------------------+-------------+  Left External Iliac <50% stenosis  +--------------------+-------------+   Stable stenoses from prior exam. Atherosclerosis noted throughout  bilateral iliac arteries.   IVC/Iliac: There is no evidence of thrombus involving the IVC.   ECHO 06/15/2018: Study Conclusions   - Left ventricle: The cavity size was moderately dilated. Systolic    function was moderately to severely reduced. The estimated    ejection fraction was in the range of 30% to 35%. Diffuse    hypokinesis. The study is not technically sufficient to allow    evaluation of LV diastolic function.  - Mitral valve: There was mild regurgitation.  - Left atrium: The atrium was mildly dilated.  - Right ventricle: The cavity size was mildly dilated. Wall    thickness was normal. Systolic function was mildly reduced.   ECHO 06/12/2018: Study Conclusions   - Left ventricle: The cavity size was normal. There was moderate    concentric hypertrophy. Systolic function was moderately reduced.    The estimated ejection fraction was in the range of 35% to 40%.    Images were inadequate for LV wall motion assessment. The study    was not technically sufficient to allow evaluation of LV    diastolic dysfunction due to atrial fibrillation.  - Mitral valve: There was mild regurgitation.  - Left atrium: The atrium was mildly dilated.  - Pulmonary arteries: Systolic  pressure was mildly increased. PA    peak pressure: 35 mm Hg (S).   LEFT Cardiac CATH 07/28/2017:  Ost RCA to Prox RCA lesion is 95% stenosed. Prox RCA lesion is 100% stenosed. Prox Cx lesion is 100% stenosed. Prox LAD lesion is 100% stenosed. Origin lesion is 100% stenosed. Origin lesion is 100% stenosed. Previously placed Dist Graft to Insertion stent (unknown type) is widely patent. Origin to Prox Graft lesion is 10% stenosed. Prox Graft lesion is 30% stenosed. Mid Graft to Dist Graft lesion is 30% stenosed. Mid Graft lesion is 50% stenosed.   Significant native CAD with total occlusion of the LAD after the takeoff of the first diagonal vessel; total occlusion of the proximal left circumflex coronary artery; and total occlusion of the proximal RCA with antegrade bridging collaterals.   Patent LIMA to LAD.   Patent Y vein graft from the 1996 surgery which supplies the diagonal vessel and distal circumflex marginal vessel.  There is diffuse narrowing of 50% in the midportion of the Y graft supplying the diagonal vessel and a patent distal stent extending to the ostium of the diagonal.  The Y graft supplying the distal marginal is free of significant disease and has mild luminal irregularity.   Occluded vein graft which had supplied the distal RCA from the 1996 surgery.   Patent SVG supplying the distal RCA from the 2007 surgery with a stent in the proximal portion of the graft with 30% narrowings in the proximal and mid segment.   Occluded vein graft from 2007 which appears to also have been stented.   LVEDP 18 mm   RECOMMENDATION: Increase medical therapy.      EKG:  EKG is personally reviewed.  05/27/2022: ***  Recent Labs: 08/07/2021: TSH 2.395 11/24/2021: ALT 28 05/25/2022: BUN 56; Creatinine, Ser 4.23; Hemoglobin 9.6; Platelets 134; Potassium 5.5; Sodium 139  Recent Lipid Panel    Component Value Date/Time   CHOL 153 11/24/2021 1625   TRIG 54 11/24/2021 1625   HDL  60 11/24/2021 1625   CHOLHDL 2.6 11/24/2021 1625   CHOLHDL 4.5 12/13/2014 0423   VLDL  25 12/13/2014 0423   LDLCALC 82 11/24/2021 1625     Risk Assessment/Calculations:   {Does this patient have ATRIAL FIBRILLATION?:[id number]}  No BP recorded.  {Refresh Note OR Click here to enter BP  :1}***         Physical Exam:    VS:  There were no vitals taken for this visit.    Wt Readings from Last 3 Encounters:  05/20/22 246 lb (111.6 kg)  05/19/22 246 lb 6.4 oz (111.8 kg)  05/05/22 247 lb (112 kg)     GEN: *** Well nourished, well developed in no acute distress HEENT: Normal NECK: No JVD; No carotid bruits LYMPHATICS: No lymphadenopathy CARDIAC: ***RRR, no murmurs, rubs, gallops RESPIRATORY:  Clear to auscultation without rales, wheezing or rhonchi  ABDOMEN: Soft, non-tender, non-distended MUSCULOSKELETAL:  No edema; No deformity  SKIN: Warm and dry NEUROLOGIC:  Alert and oriented x 3 PSYCHIATRIC:  Normal affect   ASSESSMENT:    No diagnosis found. PLAN:    In order of problems listed above:  #Chest Pain: #Multivessel CAD s/p CABG in with 1996 with re-do in 2007: Patient with extensive history of CAD s/p initial CABG in 1996 and re-do in 2007. Last cath in 2019 showed patent grafts. Currently, he is *** -Plan for PET as patient has severe renal dysfunction and would be high risk of ARF with contrast -Continue lipitor '40mg'$  daily -Continue imdur '30mg'$  daily -Not on ASA/plavix due to need for DAPT  #Paroxysmal Afib/flutter: S/p DCCV on 05/25/22 with return to NSR. Currently, ** -Continue amiodarone -Continue apixaban '5mg'$  BID  #Chronic Systolic HF: TTE with LVEF 30-35%, mild MR. Currently *** -GDMT held due to worsening lasix and irbesartan  #AKI on CKD: Cr up to 4.23. -Holding lasix, irbesartan       {Are you ordering a CV Procedure (e.g. stress test, cath, DCCV, TEE, etc)?   Press F2        :[id number]}   Follow Up: *** Medication Adjustments/Labs and Tests  Ordered: Current medicines are reviewed at length with the patient today.  Concerns regarding medicines are outlined above.  No orders of the defined types were placed in this encounter.  No orders of the defined types were placed in this encounter.   There are no Patient Instructions on file for this visit.     I,Jessica Ford,acting as a Education administrator for Freada Bergeron, MD.,have documented all relevant documentation on the behalf of Freada Bergeron, MD,as directed by  Freada Bergeron, MD while in the presence of Freada Bergeron, MD.   *** Signed, Dorna Bloom  05/27/2022 8:11 AM    [place]

## 2022-05-27 NOTE — Patient Instructions (Addendum)
PER PROVIDER DISCUSSED WITH DOC OF DAY  THAT YOU SHOULD GO TO EMERGENCY DEPARTMENT DUE TO FLUID VOLUME OVERLOAD AND HYPERKALEMIA.

## 2022-05-27 NOTE — Progress Notes (Signed)
Office Visit    Patient Name: Ronald [name] [facility] Date of Encounter: 05/27/2022  Primary Care Provider:  Glean Hess, MD Primary Cardiologist:  Ronald Chandler, MD Primary Electrophysiologist: None  Chief Complaint    Ronald [name] is a 76 y.o. male with PMH of CAD s/p CABG in 1996 (LIMA-LAD, SVG-circumflex, SVG-RCA, SVG-diagonal) with redo bypass in 2007 (SVG-OM, SVG-RCA), ischemic cardiomyopathy, CKD, DM 2, HTN, HLD, AAA, renal artery stenosis, COPD, GERD, atrial fibrillation and PVCs who presents to clinic as urgent visit for chest pain.   Past Medical History    Past Medical History:  Diagnosis Date   AAA (abdominal aortic aneurysm) ([place])    a. 3cm by [place] 2015.   Arthritis    "hips; back" (12/13/2014)   CAD (coronary artery disease) 2007   a. s/p CABG- IMA-LAD, VG-Cx, VG-RCA, VG-diag in 1999. B. sp redo CABG- VG-OM, VG-RCA in 2007 due to VG disease. c. NSTEMI 11/2014 s/p DES to SVG-OM from the Y graft.d. PTCA/DES x 1 distal body of SVG to Diagonal.09/2015   Chronic combined systolic and diastolic CHF (congestive heart failure) ([place])    a. remote EF 40-45% in 2006. b. Normal EF 2014. b. Echo 07/2016 EF 45-50%, grade 1 DD. c. Echo 2020 30% to 35%. Diffuse EF 30-35%, diffuse hypokinesis.   Chronic lower back pain    CKD (chronic kidney disease), stage IV (HCC)    COPD (chronic obstructive pulmonary disease) (HCC)    Deafness in left ear    Degenerative disc disease, lumbar    Diabetes mellitus, type 2 ([place]) 10/04/2014   Microalbumin 05/11/2012-100. Foot exam/monofilament 05/11/2012-normal.   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 noted on CT 2008.   Type II diabetes mellitus ([place])    Diet control    Unstable angina ([place]) 07/27/2017   Walking pneumonia 1990's   Wears dentures    full upper   Past Surgical History:  Procedure Laterality Date   CARDIAC CATHETERIZATION  "several"   CARDIAC CATHETERIZATION N/A 12/13/2014   Procedure: Left Heart Cath and Coronary Angiography;  Surgeon: Ronald Booze, MD;  Location: [facility];  Service: Cardiovascular;  Laterality: N/A;   CARDIAC CATHETERIZATION  12/13/2014   Procedure: Coronary Stent Intervention;  Surgeon: Ronald Booze, MD;  Location: [facility];  Service: Cardiovascular;;   CARDIAC CATHETERIZATION N/A 10/07/2015   Procedure: Left Heart Cath and Cors/Grafts Angiography;  Surgeon: Ronald Blanks, MD;  Location: [facility];  Service: Cardiovascular;  Laterality: N/A;   CARDIAC CATHETERIZATION N/A 10/07/2015   Procedure: Coronary Stent Intervention;  Surgeon: Ronald Blanks, MD;  Location: [facility];  Service: Cardiovascular;  Laterality: N/A;   CATARACT EXTRACTION W/PHACO Left 10/21/2021   Procedure: CATARACT EXTRACTION PHACO AND INTRAOCULAR LENS PLACEMENT (IOC) LEFT 3.94 00:33.7;  Surgeon: Ronald Bear, MD;  Location: [facility];  Service: Ophthalmology;  Laterality: Left;   CATARACT EXTRACTION W/PHACO  Right 11/09/2021   Procedure: CATARACT EXTRACTION PHACO AND INTRAOCULAR LENS PLACEMENT (IOC) RIGHT;  Surgeon: Ronald Bear, MD;  Location: [facility];  Service: Ophthalmology;  Laterality: Right;  3.59 0:29.4   CORONARY ANGIOPLASTY  "several"   CORONARY ANGIOPLASTY WITH STENT PLACEMENT  2005; 12/13/2014   "2; 1"   CORONARY ARTERY BYPASS GRAFT  1996   CABG X5   CORONARY ARTERY BYPASS GRAFT  March 2007   CABG X3   ESOPHAGOGASTRODUODENOSCOPY  (EGD) WITH ESOPHAGEAL DILATION  2000   GREEN LIGHT LASER TURP (TRANSURETHRAL RESECTION OF PROSTATE  2000's   "not cancerous"   HERNIA REPAIR     LAPAROSCOPIC CHOLECYSTECTOMY     LEFT HEART CATH AND CORS/GRAFTS ANGIOGRAPHY N/A 07/28/2017   Procedure: LEFT HEART CATH AND CORS/GRAFTS ANGIOGRAPHY;  Surgeon: Ronald Sine, MD;  Location: [facility];  Service: Cardiovascular;  Laterality: N/A;   LUNG SURGERY  1996   "S/P CABG, had to put staple in lung after it had collapsed"   UMBILICAL HERNIA REPAIR     w/chole    Allergies  Allergies  Allergen Reactions   Predicort [Prednisolone] Other (See Comments)    Stomach pain   Ciprofloxacin Other (See Comments)    GI upset   Hydrochlorothiazide Other (See Comments)    Dehydration   Hydrocodone Nausea Only and Other (See Comments)    Stomach upset    Hydrocodone-Acetaminophen Nausea Only    Stomach upset   Sulfa Antibiotics Other (See Comments)    Cannot recall   Hydrocodone-Acetaminophen Nausea Only and Other (See Comments)    Stomach upset   Penicillins Hives, Rash and Other (See Comments)    Has patient had a PCN reaction causing immediate rash, facial/tongue/throat swelling, SOB or lightheadedness with hypotension: YES  Has patient had a PCN reaction causing severe rash involving mucus membranes or skin necrosis: NO  Has patient had a PCN reaction that required hospitalization NO  Has patient had a PCN reaction occurring within the last 10 years:NO  If all of the above answers are "NO", then may proceed with Cephalosporin use.  Has patient had a PCN reaction causing immediate rash, facial/tongue/throat swelling, SOB or lightheadedness with hypotension: YES Has patient had a PCN reaction causing severe rash involving mucus membranes or skin necrosis: NO Has patient had a PCN reaction that required hospitalization NO Has patient had a PCN reaction occurring within the last 10 years:NO If all of the above answers are "NO",  then may proceed with Cephalosporin use.    History of Present Illness    Ronald [name]  is a 76 year old  male with the above mention past medical history who presents today for complaint of chest pain.  Ronald [name] is an extensive past medical history dating back to 1996 when he had bypass x 5 and redo bypass x 3 and 2007.  He was seen in 11/2014 plaint of chest pain and found to have NSTEMI.  iFR Was performed severe disease in  SVG to OM treated with a drug eluting stent. He was treated with Brilinta post PCI but developed dyspnea and was changed to Plavix. He was admitted to [facility] May 2017 with unstable angina and cardiac cath showed severe disease in the distal body/anastomosis of the SVG to Diagonal.  A drug eluting stent was placed in this vein graft. The LIMA to LAD was patent, SVG to OM was patent, SVG to PDA/PLA was patent.    He was admitted  07/2016 with complaint of chest pain and troponins were negative x 3.  2D echo was performed showing EF of 45-50% in 07/2017 with unstable angina and left heart cath was performed showing patency of all grafts 2D echo showing EF of 55-60% and grade 1 DD.  He was seen at [facility] in 2020 with AF with RVR and started on Xarelto with rate control achieved with IV Cardizem.  He was discharged with amiodarone p.o. for rate control.  Repeat 2D echo on 05/2018 with EF of 30-35% with mild MR and diffuse hypokinesis with LAD, mild reduced RV SF.  He was seen by Richardson Dopp, 05/19/2022 for complaint of atrial flutter.  He underwent DCCV on 05/25/2022 with conversion to sinus rhythm.  Patient's labs post DCCV revealed elevated potassium and creatinine.  He was directed to hold Lasix and Avapro BMET on Friday.  [name] presents today for complaint of chest pain and shortness of breath.  Since last being seen in the office patient report that he experienced stomach pain and chest tightness near his ribs with shortness of breath that wakes him in his sleep.  He reports  increased shortness of breath with very minimal exertion and is currently sleeping in a recliner.  Patient's case was discussed with DOD Dr. Caryl Comes who recommended that patient seek care in the ED for further evaluation.  EKG was obtained today that showed sinus bradycardia with left posterior fascicular block and rate of 56 bpm and no acute changes consistent with previous EKG.  His blood pressure today was controlled at 126/68 and heart rate is 56 bpm.  He reports full compliance with his medications and denies any adverse reactions.  Patient denies chest pain, palpitations, dyspnea, PND, orthopnea, nausea, vomiting, dizziness, syncope, edema, weight gain, or early satiety.  Home Medications    Current Outpatient Medications  Medication Sig Dispense Refill   acetaminophen (TYLENOL) 650 MG CR tablet Take 1,300 mg by mouth at bedtime.     allopurinol (ZYLOPRIM) 100 MG tablet TAKE 1 TABLET BY MOUTH EVERYDAY AT BEDTIME 90 tablet 3   amiodarone (PACERONE) 200 MG tablet Take 400 mg by mouth daily.     amLODipine (NORVASC) 10 MG tablet Take 5 mg by mouth daily.     apixaban (ELIQUIS) 5 MG TABS tablet Take 1 tablet (5 mg total) by mouth 2 (two) times daily. 180 tablet 1   atorvastatin (LIPITOR) 40 MG tablet Take 1 tablet (40 mg total) by mouth daily. (Patient taking differently: Take 40 mg by mouth at bedtime.) 90 tablet 2   Cholecalciferol (VITAMIN D-3) 125 MCG (5000 UT) TABS Take 2,000 Units by mouth daily.      furosemide (LASIX) 40 MG tablet Take 1 tablet (40 mg total) by mouth daily. (Patient taking differently: Take 40 mg by mouth daily. Oh hold 12/27) 90 tablet 3   irbesartan (AVAPRO) 150 MG tablet Take 1 tablet (150 mg total) by mouth daily. (Patient taking differently: Take 150 mg by mouth daily. On hold 12/27 per SW elevated K+) 30 tablet 11   isosorbide mononitrate (IMDUR) 30 MG 24 hr tablet TAKE 1 TABLET BY MOUTH DAILY 90 tablet 3   levothyroxine (SYNTHROID) 75 MCG tablet Take 1 tablet (75 mcg  total) by mouth daily before breakfast. 90 tablet 2   loratadine (CLARITIN) 10 MG tablet Take 10 mg by mouth daily.     magnesium oxide (MAG-OX) 400 (240 Mg) MG tablet Take 1 tablet (400 mg total) by mouth 2 (two) times  daily. 180 tablet 1   metoprolol tartrate (LOPRESSOR) 100 MG tablet TAKE 1 TABLET BY MOUTH TWICE  DAILY 180 tablet 3   Multiple Vitamin (MULTIVITAMIN PO) Take 1 tablet by mouth daily.     nitroGLYCERIN (NITROSTAT) 0.4 MG SL tablet Place 0.4 mg under the tongue every 5 (five) minutes as needed for chest pain (Up to 3 times).      pantoprazole (PROTONIX) 40 MG tablet TAKE 2 TABLETS BY MOUTH EVERY DAY (Patient taking differently: Take 40 mg by mouth 2 (two) times daily.) 180 tablet 1   polyethylene glycol (MIRALAX / GLYCOLAX) 17 g packet Take 17 g by mouth at bedtime.     sodium bicarbonate 650 MG tablet Take 650 mg by mouth 2 (two) times daily.     No current facility-administered medications for this visit.     Review of Systems  Please see the history of present illness.    (+) Shortness of breath with minimal exertion (+) Stomach pain and substernal chest discomfort  All other systems reviewed and are otherwise negative except as noted above.  Physical Exam    Wt Readings from Last 3 Encounters:  05/20/22 246 lb (111.6 kg)  05/19/22 246 lb 6.4 oz (111.8 kg)  05/05/22 247 lb (112 kg)   PY:KDXIP were no vitals filed for this visit.,There is no height or weight on file to calculate BMI.  Constitutional:      Appearance: Healthy appearance. Not in distress.  Neck:     Vascular: JVD normal.  Pulmonary:     Effort: Pulmonary effort is normal.     Breath sounds: No wheezing. No rales. Diminished in the bases Cardiovascular:     Normal rate. Regular rhythm. Normal S1. Normal S2.      Murmurs: There is no murmur.  Edema:    Peripheral edema absent.  Abdominal:     Palpations: Abdomen is soft non tender. There is no hepatomegaly.  Skin:    General: Skin is warm and  dry.  Neurological:     General: No focal deficit present.     Mental Status: Alert and oriented to person, place and time.     Cranial Nerves: Cranial nerves are intact.  EKG/LABS/Other Studies Reviewed    ECG personally reviewed by me today -sinus bradycardia with a rate of 56 bpm and left posterior fascicular block with ST and T wave abnormalities consistent with previous EKG  Risk Assessment/Calculations:    CHA2DS2-VASc Score = 6   This indicates a 9.7% annual risk of stroke. The patient's score is based upon: CHF History: 1 HTN History: 1 Diabetes History: 1 Stroke History: 0 Vascular Disease History: 1 Age Score: 2 Gender Score: 0           Lab Results  Component Value Date   WBC 8.5 05/25/2022   HGB 9.6 (L) 05/25/2022   HCT 31.1 (L) 05/25/2022   MCV 92.3 05/25/2022   PLT 134 (L) 05/25/2022   Lab Results  Component Value Date   CREATININE 4.23 (H) 05/25/2022   BUN 56 (H) 05/25/2022   NA 139 05/25/2022   K 5.5 (H) 05/25/2022   CL 107 05/25/2022   CO2 25 05/25/2022   Lab Results  Component Value Date   ALT 28 11/24/2021   AST 26 11/24/2021   ALKPHOS 155 (H) 11/24/2021   BILITOT 0.9 11/24/2021   Lab Results  Component Value Date   CHOL 153 11/24/2021   HDL 60 11/24/2021   LDLCALC 82  11/24/2021   TRIG 54 11/24/2021   CHOLHDL 2.6 11/24/2021    Lab Results  Component Value Date   HGBA1C 5.7 (H) 11/24/2021    Assessment & Plan    1.  Chest pain and shortness of breath: -The patient reports chest discomfort with shortness of breath with minimal activity.  He is volume up on exam today with +1 lower extremity edema.  Due to hyperkalemia patient was advised to seek care in the ED for further evaluation by DOD Dr. Caryl Comes. -Patient was advised to proceed to ED at this time however he elects to go home and seek care at [facility].  I advised him to call 911 if he develops chest pain that is unrelieved with nitroglycerin and increased shortness of  breath.  Patient and his wife expressed full understanding of plan of care.  2.  Coronary artery disease: -s/p CABG x 5 in 1996 with redo CABG x 3 in 2007.  Patient had last LHC performed in 2019 that showed no obstructions and grafts. -Today patient reports minimal chest pain and is not worse with activity and is described by the patient as more GI related. -Return precautions discussed as noted above -Continue GDMT with Lipitor 40 mg, Imdur 30 mg, and Lopressor 100 mg daily  3.  HFrEF: -2D echo completed 05/2018 with EF of 30-35% with mild MR and diffuse hypokinesis with LAD, mild reduced RV SF -Irbesartan held due to worsening renal function and hyperkalemia -Patient is volume up on exam and advised to seek care in the ED for further evaluation at this time per DOD. -Low sodium diet, fluid restriction <2L, and daily weights encouraged. Educated to contact our office for weight gain of 2 lbs overnight or 5 lbs in one week.   4.  Paroxysmal atrial fibrillation: -s/p DCCV 05/25/2022 with conversion to sinus rhythm -Continue rate control with amiodarone 200 mg twice daily metoprolol 100 mg twice daily -Continue Eliquis 5 mg twice daily  Disposition: Follow-up with Ronald Chandler, MD or APP as scheduled    Medication Adjustments/Labs and Tests Ordered: Current medicines are reviewed at length with the patient today.  Concerns regarding medicines are outlined above.   Signed, Mable Fill, Marissa Nestle, NP 05/27/2022, 12:46 PM [facility]  Note:  This document was prepared using Dragon voice recognition software and may include unintentional dictation errors.

## 2022-05-28 ENCOUNTER — Other Ambulatory Visit: Payer: Self-pay

## 2022-05-28 ENCOUNTER — Telehealth: Payer: Self-pay | Admitting: Cardiovascular Disease

## 2022-05-28 ENCOUNTER — Emergency Department: Payer: Medicare Other

## 2022-05-28 ENCOUNTER — Inpatient Hospital Stay
Admission: EM | Admit: 2022-05-28 | Discharge: 2022-05-31 | DRG: 291 | Disposition: A | Discharge status: Home / Self Care | Payer: Medicare Other | Source: Ambulatory Visit | Attending: Internal Medicine | Admitting: Internal Medicine

## 2022-05-28 DIAGNOSIS — E039 Hypothyroidism, unspecified: Secondary | ICD-10-CM

## 2022-05-28 DIAGNOSIS — M109 Gout, unspecified: Secondary | ICD-10-CM | POA: Diagnosis present

## 2022-05-28 DIAGNOSIS — I509 Heart failure, unspecified: Principal | ICD-10-CM

## 2022-05-28 DIAGNOSIS — I5043 Acute on chronic combined systolic (congestive) and diastolic (congestive) heart failure: Secondary | ICD-10-CM

## 2022-05-28 DIAGNOSIS — Z6835 Body mass index (BMI) 35.0-35.9, adult: Secondary | ICD-10-CM

## 2022-05-28 DIAGNOSIS — I7121 Aneurysm of the ascending aorta, without rupture: Secondary | ICD-10-CM | POA: Diagnosis not present

## 2022-05-28 DIAGNOSIS — N189 Chronic kidney disease, unspecified: Secondary | ICD-10-CM | POA: Diagnosis present

## 2022-05-28 DIAGNOSIS — N184 Chronic kidney disease, stage 4 (severe): Secondary | ICD-10-CM | POA: Diagnosis present

## 2022-05-28 DIAGNOSIS — Z8701 Personal history of pneumonia (recurrent): Secondary | ICD-10-CM

## 2022-05-28 DIAGNOSIS — I480 Paroxysmal atrial fibrillation: Secondary | ICD-10-CM

## 2022-05-28 DIAGNOSIS — I5021 Acute systolic (congestive) heart failure: Secondary | ICD-10-CM | POA: Diagnosis not present

## 2022-05-28 DIAGNOSIS — I2510 Atherosclerotic heart disease of native coronary artery without angina pectoris: Secondary | ICD-10-CM

## 2022-05-28 DIAGNOSIS — I358 Other nonrheumatic aortic valve disorders: Secondary | ICD-10-CM | POA: Diagnosis not present

## 2022-05-28 DIAGNOSIS — Z7901 Long term (current) use of anticoagulants: Secondary | ICD-10-CM | POA: Diagnosis not present

## 2022-05-28 DIAGNOSIS — I130 Hypertensive heart and chronic kidney disease with heart failure and stage 1 through stage 4 chronic kidney disease, or unspecified chronic kidney disease: Secondary | ICD-10-CM | POA: Diagnosis not present

## 2022-05-28 DIAGNOSIS — I701 Atherosclerosis of renal artery: Secondary | ICD-10-CM | POA: Diagnosis not present

## 2022-05-28 DIAGNOSIS — I25810 Atherosclerosis of coronary artery bypass graft(s) without angina pectoris: Secondary | ICD-10-CM | POA: Diagnosis present

## 2022-05-28 DIAGNOSIS — D649 Anemia, unspecified: Secondary | ICD-10-CM | POA: Diagnosis present

## 2022-05-28 DIAGNOSIS — M48061 Spinal stenosis, lumbar region without neurogenic claudication: Secondary | ICD-10-CM | POA: Diagnosis present

## 2022-05-28 DIAGNOSIS — I110 Hypertensive heart disease with heart failure: Secondary | ICD-10-CM | POA: Diagnosis not present

## 2022-05-28 DIAGNOSIS — N179 Acute kidney failure, unspecified: Secondary | ICD-10-CM | POA: Diagnosis present

## 2022-05-28 DIAGNOSIS — J439 Emphysema, unspecified: Secondary | ICD-10-CM | POA: Diagnosis not present

## 2022-05-28 DIAGNOSIS — H9192 Unspecified hearing loss, left ear: Secondary | ICD-10-CM | POA: Diagnosis present

## 2022-05-28 DIAGNOSIS — D631 Anemia in chronic kidney disease: Secondary | ICD-10-CM | POA: Diagnosis present

## 2022-05-28 DIAGNOSIS — M545 Low back pain, unspecified: Secondary | ICD-10-CM | POA: Diagnosis present

## 2022-05-28 DIAGNOSIS — I120 Hypertensive chronic kidney disease with stage 5 chronic kidney disease or end stage renal disease: Secondary | ICD-10-CM | POA: Diagnosis present

## 2022-05-28 DIAGNOSIS — I119 Hypertensive heart disease without heart failure: Secondary | ICD-10-CM | POA: Diagnosis not present

## 2022-05-28 DIAGNOSIS — Z87891 Personal history of nicotine dependence: Secondary | ICD-10-CM | POA: Diagnosis not present

## 2022-05-28 DIAGNOSIS — I255 Ischemic cardiomyopathy: Secondary | ICD-10-CM | POA: Diagnosis present

## 2022-05-28 DIAGNOSIS — Z955 Presence of coronary angioplasty implant and graft: Secondary | ICD-10-CM

## 2022-05-28 DIAGNOSIS — Z8249 Family history of ischemic heart disease and other diseases of the circulatory system: Secondary | ICD-10-CM

## 2022-05-28 DIAGNOSIS — Z881 Allergy status to other antibiotic agents status: Secondary | ICD-10-CM

## 2022-05-28 DIAGNOSIS — E1122 Type 2 diabetes mellitus with diabetic chronic kidney disease: Secondary | ICD-10-CM | POA: Diagnosis not present

## 2022-05-28 DIAGNOSIS — E785 Hyperlipidemia, unspecified: Secondary | ICD-10-CM

## 2022-05-28 DIAGNOSIS — I712 Thoracic aortic aneurysm, without rupture, unspecified: Secondary | ICD-10-CM | POA: Diagnosis not present

## 2022-05-28 DIAGNOSIS — I420 Dilated cardiomyopathy: Secondary | ICD-10-CM | POA: Diagnosis present

## 2022-05-28 DIAGNOSIS — E1121 Type 2 diabetes mellitus with diabetic nephropathy: Secondary | ICD-10-CM | POA: Diagnosis not present

## 2022-05-28 DIAGNOSIS — K219 Gastro-esophageal reflux disease without esophagitis: Secondary | ICD-10-CM | POA: Insufficient documentation

## 2022-05-28 DIAGNOSIS — Z825 Family history of asthma and other chronic lower respiratory diseases: Secondary | ICD-10-CM

## 2022-05-28 DIAGNOSIS — E8770 Fluid overload, unspecified: Secondary | ICD-10-CM | POA: Diagnosis not present

## 2022-05-28 DIAGNOSIS — Z79899 Other long term (current) drug therapy: Secondary | ICD-10-CM

## 2022-05-28 DIAGNOSIS — R0789 Other chest pain: Secondary | ICD-10-CM | POA: Diagnosis not present

## 2022-05-28 DIAGNOSIS — N2581 Secondary hyperparathyroidism of renal origin: Secondary | ICD-10-CM | POA: Diagnosis present

## 2022-05-28 DIAGNOSIS — R001 Bradycardia, unspecified: Secondary | ICD-10-CM | POA: Diagnosis not present

## 2022-05-28 DIAGNOSIS — J810 Acute pulmonary edema: Secondary | ICD-10-CM

## 2022-05-28 DIAGNOSIS — Z951 Presence of aortocoronary bypass graft: Secondary | ICD-10-CM

## 2022-05-28 DIAGNOSIS — Z1152 Encounter for screening for COVID-19: Secondary | ICD-10-CM | POA: Diagnosis not present

## 2022-05-28 DIAGNOSIS — R0602 Shortness of breath: Secondary | ICD-10-CM | POA: Diagnosis not present

## 2022-05-28 DIAGNOSIS — G8929 Other chronic pain: Secondary | ICD-10-CM | POA: Diagnosis present

## 2022-05-28 DIAGNOSIS — Z823 Family history of stroke: Secondary | ICD-10-CM

## 2022-05-28 DIAGNOSIS — Z882 Allergy status to sulfonamides status: Secondary | ICD-10-CM

## 2022-05-28 DIAGNOSIS — I252 Old myocardial infarction: Secondary | ICD-10-CM

## 2022-05-28 DIAGNOSIS — I10 Essential (primary) hypertension: Secondary | ICD-10-CM | POA: Diagnosis not present

## 2022-05-28 DIAGNOSIS — Z83438 Family history of other disorder of lipoprotein metabolism and other lipidemia: Secondary | ICD-10-CM

## 2022-05-28 DIAGNOSIS — Z885 Allergy status to narcotic agent status: Secondary | ICD-10-CM

## 2022-05-28 DIAGNOSIS — Z79890 Hormone replacement therapy (postmenopausal): Secondary | ICD-10-CM

## 2022-05-28 HISTORY — DX: Acute on chronic combined systolic (congestive) and diastolic (congestive) heart failure: I50.43

## 2022-05-28 LAB — RESP PANEL BY RT-PCR (RSV, FLU A&B, COVID)  RVPGX2
Influenza A by PCR: NEGATIVE
Influenza B by PCR: NEGATIVE
Resp Syncytial Virus by PCR: NEGATIVE
SARS Coronavirus 2 by RT PCR: NEGATIVE

## 2022-05-28 LAB — CBC WITH DIFFERENTIAL/PLATELET
Abs Immature Granulocytes: 0.03 10*3/uL (ref 0.00–0.07)
Basophils Absolute: 0.1 10*3/uL (ref 0.0–0.1)
Basophils Relative: 1 %
Eosinophils Absolute: 0.2 10*3/uL (ref 0.0–0.5)
Eosinophils Relative: 3 %
HCT: 29.4 % — ABNORMAL LOW (ref 39.0–52.0)
Hemoglobin: 9.1 g/dL — ABNORMAL LOW (ref 13.0–17.0)
Immature Granulocytes: 1 %
Lymphocytes Relative: 10 %
Lymphs Abs: 0.7 10*3/uL (ref 0.7–4.0)
MCH: 28.3 pg (ref 26.0–34.0)
MCHC: 31 g/dL (ref 30.0–36.0)
MCV: 91.3 fL (ref 80.0–100.0)
Monocytes Absolute: 0.7 10*3/uL (ref 0.1–1.0)
Monocytes Relative: 11 %
Neutro Abs: 4.9 10*3/uL (ref 1.7–7.7)
Neutrophils Relative %: 74 %
Platelets: 143 10*3/uL — ABNORMAL LOW (ref 150–400)
RBC: 3.22 MIL/uL — ABNORMAL LOW (ref 4.22–5.81)
RDW: 16.4 % — ABNORMAL HIGH (ref 11.5–15.5)
WBC: 6.5 10*3/uL (ref 4.0–10.5)
nRBC: 0 % (ref 0.0–0.2)

## 2022-05-28 LAB — COMPREHENSIVE METABOLIC PANEL
ALT: 43 U/L (ref 0–44)
AST: 46 U/L — ABNORMAL HIGH (ref 15–41)
Albumin: 3.5 g/dL (ref 3.5–5.0)
Alkaline Phosphatase: 126 U/L (ref 38–126)
Anion gap: 6 (ref 5–15)
BUN: 66 mg/dL — ABNORMAL HIGH (ref 8–23)
CO2: 22 mmol/L (ref 22–32)
Calcium: 9.1 mg/dL (ref 8.9–10.3)
Chloride: 115 mmol/L — ABNORMAL HIGH (ref 98–111)
Creatinine, Ser: 4.1 mg/dL — ABNORMAL HIGH (ref 0.61–1.24)
GFR, Estimated: 14 mL/min — ABNORMAL LOW (ref >60)
Glucose, Bld: 140 mg/dL — ABNORMAL HIGH (ref 70–99)
Potassium: 4.5 mmol/L (ref 3.5–5.1)
Sodium: 143 mmol/L (ref 135–145)
Total Bilirubin: 1.4 mg/dL — ABNORMAL HIGH (ref 0.3–1.2)
Total Protein: 6.8 g/dL (ref 6.5–8.1)

## 2022-05-28 LAB — URINALYSIS, ROUTINE W REFLEX MICROSCOPIC
Bilirubin Urine: NEGATIVE
Glucose, UA: NEGATIVE mg/dL
Hgb urine dipstick: NEGATIVE
Ketones, ur: NEGATIVE mg/dL
Leukocytes,Ua: NEGATIVE
Nitrite: NEGATIVE
Protein, ur: NEGATIVE mg/dL
Specific Gravity, Urine: 1.013 (ref 1.005–1.030)
pH: 6 (ref 5.0–8.0)

## 2022-05-28 LAB — TROPONIN I (HIGH SENSITIVITY)
Troponin I (High Sensitivity): 12 ng/L (ref <18)
Troponin I (High Sensitivity): 13 ng/L (ref <18)

## 2022-05-28 LAB — TSH: TSH: 1.951 u[IU]/mL (ref 0.350–4.500)

## 2022-05-28 LAB — BRAIN NATRIURETIC PEPTIDE: B Natriuretic Peptide: 788.6 pg/mL — ABNORMAL HIGH (ref 0.0–100.0)

## 2022-05-28 MED ORDER — ALLOPURINOL 100 MG PO TABS
100.0000 mg | ORAL_TABLET | Freq: Every day | ORAL | Status: DC
  Administered 2022-05-29 – 2022-05-30 (×2): 100 mg via ORAL
  Filled 2022-05-28 (×3): qty 1

## 2022-05-28 MED ORDER — LEVOTHYROXINE SODIUM 50 MCG PO TABS
75.0000 ug | ORAL_TABLET | Freq: Every day | ORAL | Status: DC
  Administered 2022-05-29 – 2022-05-31 (×3): 75 ug via ORAL
  Filled 2022-05-28 (×3): qty 1

## 2022-05-28 MED ORDER — FUROSEMIDE 10 MG/ML IJ SOLN
40.0000 mg | Freq: Two times a day (BID) | INTRAMUSCULAR | Status: DC
  Administered 2022-05-29 – 2022-05-30 (×3): 40 mg via INTRAVENOUS
  Filled 2022-05-28 (×3): qty 4

## 2022-05-28 MED ORDER — ISOSORBIDE MONONITRATE ER 30 MG PO TB24
30.0000 mg | ORAL_TABLET | Freq: Every day | ORAL | Status: DC
  Administered 2022-05-29 – 2022-05-31 (×3): 30 mg via ORAL
  Filled 2022-05-28 (×3): qty 1

## 2022-05-28 MED ORDER — SODIUM BICARBONATE 650 MG PO TABS
650.0000 mg | ORAL_TABLET | Freq: Two times a day (BID) | ORAL | Status: DC
  Administered 2022-05-29 – 2022-05-31 (×5): 650 mg via ORAL
  Filled 2022-05-28 (×6): qty 1

## 2022-05-28 MED ORDER — ACETAMINOPHEN 650 MG RE SUPP: 650.0000 mg | Freq: Four times a day (QID) | RECTAL | Status: DC | PRN

## 2022-05-28 MED ORDER — IRBESARTAN 150 MG PO TABS: 150.0000 mg | ORAL_TABLET | Freq: Every day | ORAL | Status: DC

## 2022-05-28 MED ORDER — POLYETHYLENE GLYCOL 3350 17 G PO PACK
17.0000 g | PACK | Freq: Every day | ORAL | Status: DC
  Administered 2022-05-29: 17 g via ORAL
  Filled 2022-05-28 (×2): qty 1

## 2022-05-28 MED ORDER — INSULIN ASPART 100 UNIT/ML IJ SOLN: 0.0000 [IU] | Freq: Every day | INTRAMUSCULAR | Status: DC

## 2022-05-28 MED ORDER — ADULT MULTIVITAMIN W/MINERALS CH
1.0000 | ORAL_TABLET | Freq: Every day | ORAL | Status: DC
  Administered 2022-05-29 – 2022-05-31 (×3): 1 via ORAL
  Filled 2022-05-28 (×3): qty 1

## 2022-05-28 MED ORDER — APIXABAN 5 MG PO TABS
5.0000 mg | ORAL_TABLET | Freq: Two times a day (BID) | ORAL | Status: DC
  Administered 2022-05-28 – 2022-05-31 (×6): 5 mg via ORAL
  Filled 2022-05-28 (×6): qty 1

## 2022-05-28 MED ORDER — AMLODIPINE BESYLATE 5 MG PO TABS
5.0000 mg | ORAL_TABLET | Freq: Every day | ORAL | Status: DC
  Filled 2022-05-28: qty 1

## 2022-05-28 MED ORDER — METOPROLOL TARTRATE 50 MG PO TABS
100.0000 mg | ORAL_TABLET | Freq: Two times a day (BID) | ORAL | Status: DC
  Administered 2022-05-29 – 2022-05-31 (×3): 100 mg via ORAL
  Filled 2022-05-28 (×3): qty 4
  Filled 2022-05-28: qty 2
  Filled 2022-05-28: qty 4
  Filled 2022-05-28: qty 2

## 2022-05-28 MED ORDER — INSULIN ASPART 100 UNIT/ML IJ SOLN
0.0000 [IU] | Freq: Three times a day (TID) | INTRAMUSCULAR | Status: DC
  Filled 2022-05-28: qty 1

## 2022-05-28 MED ORDER — VITAMIN D 25 MCG (1000 UNIT) PO TABS
2000.0000 [IU] | ORAL_TABLET | Freq: Every day | ORAL | Status: DC
  Administered 2022-05-29 – 2022-05-31 (×3): 2000 [IU] via ORAL
  Filled 2022-05-28 (×3): qty 2

## 2022-05-28 MED ORDER — MAGNESIUM HYDROXIDE 400 MG/5ML PO SUSP: 30.0000 mL | Freq: Every day | ORAL | Status: DC | PRN

## 2022-05-28 MED ORDER — ATORVASTATIN CALCIUM 20 MG PO TABS
40.0000 mg | ORAL_TABLET | Freq: Every day | ORAL | Status: DC
  Administered 2022-05-28 – 2022-05-30 (×3): 40 mg via ORAL
  Filled 2022-05-28 (×3): qty 2

## 2022-05-28 MED ORDER — NITROGLYCERIN 0.4 MG SL SUBL: 0.4000 mg | SUBLINGUAL_TABLET | SUBLINGUAL | Status: DC | PRN

## 2022-05-28 MED ORDER — MAGNESIUM OXIDE -MG SUPPLEMENT 400 (240 MG) MG PO TABS
400.0000 mg | ORAL_TABLET | Freq: Two times a day (BID) | ORAL | Status: DC
  Administered 2022-05-28 – 2022-05-31 (×6): 400 mg via ORAL
  Filled 2022-05-28 (×6): qty 1

## 2022-05-28 MED ORDER — FUROSEMIDE 10 MG/ML IJ SOLN
80.0000 mg | Freq: Once | INTRAMUSCULAR | Status: AC
  Administered 2022-05-28: 80 mg via INTRAVENOUS
  Filled 2022-05-28: qty 8

## 2022-05-28 MED ORDER — ONDANSETRON HCL 4 MG/2ML IJ SOLN: 4.0000 mg | Freq: Four times a day (QID) | INTRAMUSCULAR | Status: DC | PRN

## 2022-05-28 MED ORDER — ONDANSETRON HCL 4 MG PO TABS: 4.0000 mg | ORAL_TABLET | Freq: Four times a day (QID) | ORAL | Status: DC | PRN

## 2022-05-28 MED ORDER — LORATADINE 10 MG PO TABS
10.0000 mg | ORAL_TABLET | Freq: Every day | ORAL | Status: DC
  Administered 2022-05-29 – 2022-05-31 (×3): 10 mg via ORAL
  Filled 2022-05-28 (×3): qty 1

## 2022-05-28 MED ORDER — PANTOPRAZOLE SODIUM 40 MG PO TBEC
40.0000 mg | DELAYED_RELEASE_TABLET | Freq: Two times a day (BID) | ORAL | Status: DC
  Administered 2022-05-28 – 2022-05-31 (×6): 40 mg via ORAL
  Filled 2022-05-28 (×6): qty 1

## 2022-05-28 MED ORDER — AMIODARONE HCL 200 MG PO TABS
400.0000 mg | ORAL_TABLET | Freq: Every day | ORAL | Status: DC
  Administered 2022-05-29 – 2022-05-31 (×3): 400 mg via ORAL
  Filled 2022-05-28 (×4): qty 2

## 2022-05-28 MED ORDER — ACETAMINOPHEN 325 MG PO TABS
650.0000 mg | ORAL_TABLET | Freq: Four times a day (QID) | ORAL | Status: DC | PRN
  Administered 2022-05-30 – 2022-05-31 (×2): 650 mg via ORAL
  Filled 2022-05-28 (×2): qty 2

## 2022-05-28 MED ORDER — TRAZODONE HCL 50 MG PO TABS: 25.0000 mg | ORAL_TABLET | Freq: Every evening | ORAL | Status: DC | PRN

## 2022-05-28 NOTE — Telephone Encounter (Signed)
Spoke with spouse who was concerned for patient's fluid overload and wanted to take patient to ER. She stated she had tried to go to an Urgent Care in [facility] but was directed to the ER. Since she was still in [place] I directed her to [facility].

## 2022-05-28 NOTE — Assessment & Plan Note (Signed)
-   We will continue his statin therapy.

## 2022-05-28 NOTE — Telephone Encounter (Signed)
Patient's wife states during 12/28 appointment with Ambrose Pancoast, NP, patient was advised to go to an ED for fluid retention. She would like to know exactly where she needs to take him.

## 2022-05-28 NOTE — H&P (Signed)
[facility]   PATIENT NAMETorian [name]    MR#:  [id number]  DATE OF BIRTH:  1945-11-16  DATE OF ADMISSION:  05/28/2022  PRIMARY CARE PHYSICIAN: Glean Hess, MD   Patient is coming from: Home  REQUESTING/REFERRING PHYSICIAN: Harvest Dark, MD  CHIEF COMPLAINT:   Chief Complaint  Patient presents with   Fluid Overloaded    HISTORY OF PRESENT ILLNESS:  Ronald [name] is a 76 y.o. Caucasian male with medical history significant for combined systolic and diastolic CHF, coronary artery disease s/p CABG, s/p PCI and stents, COPD, paroxysmal atrial fibrillation on Eliquis, stage IV chronic kidney disease, hypertension, dyslipidemia, chronic low back pain and type 2 diabetes mellitus, who presented to the emergency room with acute onset of worsening dyspnea over the last 3 weeks with associated mild dry cough and significant postnasal drip.  He admitted to orthopnea, sleeping on a recliner, worsening lower extremity edema and paroxysmal nocturnal dyspnea.  He denied any fever but has been having occasional chills.  No dysuria, oliguria or hematuria or flank pain.  No chest pain or palpitations.  No nausea or vomiting or abdominal pain.  No bleeding diathesis.  He had to gaining 6 pounds over the last month.  ED Course: When he came to the ER, heart rate was 56 with otherwise normal vital signs.  Pulse oximetry was 91 to 92% on room air later on and he was placed on 2 L of O2 by nasal cannula.  Labs revealed potassium of 4.5 and a BUN of 66 with creatinine of 4.1 comparable to previous levels with a chloride of 115, AST 46 and total bili 1.4.  BNP was 788 above previous levels.  High sensitive troponin I was 12 and later 13.  CBC showed anemia close to baseline.  Respiratory panel is currently pending. EKG as reviewed by me : EKG showed sinus bradycardia with a rate of 54 with first-degree AV block and poor R wave progression with T wave inversion laterally and  inferiorly. Imaging: Two-view chest x-ray showed diffuse mild interstitial thickening and patchy opacities in the chest suspicious for multifocal infection or asymmetric edema.  Given more nodular appearance of the left upper lobe and mid chest changes recommendation was for follow-up PA and lateral chest x-ray.  The patient was given 80 mg of IV Lasix.  He will be admitted to a cardiac telemetry bed for further evaluation and management PAST MEDICAL HISTORY:   Past Medical History:  Diagnosis Date   AAA (abdominal aortic aneurysm) ([place])    a. 3cm by [place] 2015.   Arthritis    "hips; back" (12/13/2014)   CAD (coronary artery disease) 2007   a. s/p CABG- IMA-LAD, VG-Cx, VG-RCA, VG-diag in 1999. B. sp redo CABG- VG-OM, VG-RCA in 2007 due to VG disease. c. NSTEMI 11/2014 s/p DES to SVG-OM from the Y graft.d. PTCA/DES x 1 distal body of SVG to Diagonal.09/2015   Chronic combined systolic and diastolic CHF (congestive heart failure) ([place])    a. remote EF 40-45% in 2006. b. Normal EF 2014. b. Echo 07/2016 EF 45-50%, grade 1 DD. c. Echo 2020 30% to 35%. Diffuse EF 30-35%, diffuse hypokinesis.   Chronic lower back pain    CKD (chronic kidney disease), stage IV (HCC)    COPD (chronic obstructive pulmonary disease) (HCC)    Deafness in left ear    Degenerative disc disease, lumbar    Diabetes mellitus, type 2 ([place]) 10/04/2014   Microalbumin  05/11/2012-100. Foot exam/monofilament 05/11/2012-normal.   Dilated cardiomyopathy ([place]) 10/07/2015   Emphysema    Esophageal stricture 07/02/1998   EGD   Genital candidiasis in male 10/25/2012   GERD (gastroesophageal reflux disease)    History of gout    "last flareup was in 2007" (12/13/2014)   History of hiatal hernia    Hyperlipidemia    Hypertension    Ischemic cardiomyopathy 2006   Echo 2020: EF 30-35%, diffuse hypokinesis   NSTEMI (non-ST elevated myocardial infarction) ([place]) 12/13/2014   PVC's (premature ventricular contractions)    Renal artery  stenosis ([place])    a. noted on CT 2008.   Type II diabetes mellitus (HCC)    Diet control    Unstable angina ([place]) 07/27/2017   Walking pneumonia 1990's   Wears dentures    full upper  -Paroxysmal atrial fibrillation  PAST SURGICAL HISTORY:   Past Surgical History:  Procedure Laterality Date   CARDIAC CATHETERIZATION  "several"   CARDIAC CATHETERIZATION N/A 12/13/2014   Procedure: Left Heart Cath and Coronary Angiography;  Surgeon: Jettie Booze, MD;  Location: [facility];  Service: Cardiovascular;  Laterality: N/A;   CARDIAC CATHETERIZATION  12/13/2014   Procedure: Coronary Stent Intervention;  Surgeon: Jettie Booze, MD;  Location: [facility];  Service: Cardiovascular;;   CARDIAC CATHETERIZATION N/A 10/07/2015   Procedure: Left Heart Cath and Cors/Grafts Angiography;  Surgeon: Burnell Blanks, MD;  Location: [facility] [facility];  Service: Cardiovascular;  Laterality: N/A;   CARDIAC CATHETERIZATION N/A 10/07/2015   Procedure: Coronary Stent Intervention;  Surgeon: Burnell Blanks, MD;  Location: [facility];  Service: Cardiovascular;  Laterality: N/A;   CARDIOVERSION N/A 05/25/2022   Procedure: CARDIOVERSION;  Surgeon: Berniece Salines, DO;  Location: [facility];  Service: Cardiovascular;  Laterality: N/A;   CATARACT EXTRACTION W/PHACO Left 10/21/2021   Procedure: CATARACT EXTRACTION PHACO AND INTRAOCULAR LENS PLACEMENT (IOC) LEFT 3.94 00:33.7;  Surgeon: Eulogio Bear, MD;  Location: [facility];  Service: Ophthalmology;  Laterality: Left;   CATARACT EXTRACTION W/PHACO Right 11/09/2021   Procedure: CATARACT EXTRACTION PHACO AND INTRAOCULAR LENS PLACEMENT (IOC) RIGHT;  Surgeon: Eulogio Bear, MD;  Location: [facility];  Service: Ophthalmology;  Laterality: Right;  3.59 0:29.4   CORONARY ANGIOPLASTY  "several"   CORONARY ANGIOPLASTY WITH STENT PLACEMENT  2005; 12/13/2014   "2; 1"   CORONARY ARTERY BYPASS GRAFT  1996    CABG X5   CORONARY ARTERY BYPASS GRAFT  March 2007   CABG X3   ESOPHAGOGASTRODUODENOSCOPY (EGD) WITH ESOPHAGEAL DILATION  2000   GREEN LIGHT LASER TURP (TRANSURETHRAL RESECTION OF PROSTATE  2000's   "not cancerous"   HERNIA REPAIR     LAPAROSCOPIC CHOLECYSTECTOMY     LEFT HEART CATH AND CORS/GRAFTS ANGIOGRAPHY N/A 07/28/2017   Procedure: LEFT HEART CATH AND CORS/GRAFTS ANGIOGRAPHY;  Surgeon: Troy Sine, MD;  Location: [facility];  Service: Cardiovascular;  Laterality: N/A;   LUNG SURGERY  1996   "S/P CABG, had to put staple in lung after it had collapsed"   UMBILICAL HERNIA REPAIR     w/chole    SOCIAL HISTORY:   Social History   Tobacco Use   Smoking status: Former    Packs/day: 3.00    Years: 20.00    Total pack years: 60.00    Types: Cigarettes    Quit date: 07/25/1986    Years since quitting: 35.8   Smokeless tobacco: Never  Substance Use  Topics   Alcohol use: No    Alcohol/week: 0.0 standard drinks of alcohol    FAMILY HISTORY:   Family History  Problem Relation Age of Onset   Heart attack Mother        MI   Stroke Mother    Heart disease Mother    Hypertension Mother    Hyperlipidemia Mother    Asthma Mother    Heart disease Father    Rheumatic fever Father    Colon cancer Neg Hx     DRUG ALLERGIES:   Allergies  Allergen Reactions   Predicort [Prednisolone] Other (See Comments)    Stomach pain   Ciprofloxacin Other (See Comments)    GI upset   Hydrochlorothiazide Other (See Comments)    Dehydration   Hydrocodone Nausea Only and Other (See Comments)    Stomach upset    Hydrocodone-Acetaminophen Nausea Only    Stomach upset   Sulfa Antibiotics Other (See Comments)    Cannot recall   Hydrocodone-Acetaminophen Nausea Only and Other (See Comments)    Stomach upset   Penicillins Hives, Rash and Other (See Comments)    Has patient had a PCN reaction causing immediate rash, facial/tongue/throat swelling, SOB or lightheadedness with  hypotension: YES  Has patient had a PCN reaction causing severe rash involving mucus membranes or skin necrosis: NO  Has patient had a PCN reaction that required hospitalization NO  Has patient had a PCN reaction occurring within the last 10 years:NO  If all of the above answers are "NO", then may proceed with Cephalosporin use.  Has patient had a PCN reaction causing immediate rash, facial/tongue/throat swelling, SOB or lightheadedness with hypotension: YES Has patient had a PCN reaction causing severe rash involving mucus membranes or skin necrosis: NO Has patient had a PCN reaction that required hospitalization NO Has patient had a PCN reaction occurring within the last 10 years:NO If all of the above answers are "NO", then may proceed with Cephalosporin use.    REVIEW OF SYSTEMS:   ROS As per history of present illness. All pertinent systems were reviewed above. Constitutional, HEENT, cardiovascular, respiratory, GI, GU, musculoskeletal, neuro, psychiatric, endocrine, integumentary and hematologic systems were reviewed and are otherwise negative/unremarkable except for positive findings mentioned above in the HPI.   MEDICATIONS AT HOME:   Prior to Admission medications   Medication Sig Start Date End Date Taking? Authorizing Provider  acetaminophen (TYLENOL) 650 MG CR tablet Take 1,300 mg by mouth at bedtime.    [provider]  allopurinol (ZYLOPRIM) 100 MG tablet TAKE 1 TABLET BY MOUTH EVERYDAY AT BEDTIME 01/11/22   [name]., MD  amiodarone (PACERONE) 200 MG tablet Take 400 mg by mouth daily.    [provider]  amLODipine (NORVASC) 10 MG tablet Take 5 mg by mouth daily.    [provider]  apixaban (ELIQUIS) 5 MG TABS tablet Take 1 tablet (5 mg total) by mouth 2 (two) times daily. 09/09/21   Burnell Blanks, MD  atorvastatin (LIPITOR) 40 MG tablet Take 1 tablet (40 mg total) by mouth daily. Patient taking differently: Take 40 mg by  mouth at bedtime. 01/11/22   [name]., MD  Cholecalciferol (VITAMIN D-3) 125 MCG (5000 UT) TABS Take 2,000 Units by mouth daily.     [provider]  furosemide (LASIX) 40 MG tablet Take 1 tablet (40 mg total) by mouth daily. Patient taking differently: Take 40 mg by mouth daily. Oh hold 12/27 05/20/22  Kathlen Mody, Scott T, PA-C  irbesartan (AVAPRO) 150 MG tablet Take 1 tablet (150 mg total) by mouth daily. Patient taking differently: Take 150 mg by mouth daily. On hold 12/27 per SW elevated K+ 08/07/21   Tanda Rockers, MD  isosorbide mononitrate (IMDUR) 30 MG 24 hr tablet TAKE 1 TABLET BY MOUTH DAILY 01/08/22   Burnell Blanks, MD  levothyroxine (SYNTHROID) 75 MCG tablet Take 1 tablet (75 mcg total) by mouth daily before breakfast. 01/11/22   [name]., MD  loratadine (CLARITIN) 10 MG tablet Take 10 mg by mouth daily.    [provider]  magnesium oxide (MAG-OX) 400 (240 Mg) MG tablet Take 1 tablet (400 mg total) by mouth 2 (two) times daily. 12/07/21   [name]., MD  metoprolol tartrate (LOPRESSOR) 100 MG tablet TAKE 1 TABLET BY MOUTH TWICE  DAILY 01/08/22   Burnell Blanks, MD  Multiple Vitamin (MULTIVITAMIN PO) Take 1 tablet by mouth daily.    [provider]  nitroGLYCERIN (NITROSTAT) 0.4 MG SL tablet Place 0.4 mg under the tongue every 5 (five) minutes as needed for chest pain (Up to 3 times).     [provider]  pantoprazole (PROTONIX) 40 MG tablet TAKE 2 TABLETS BY MOUTH EVERY DAY Patient taking differently: Take 40 mg by mouth 2 (two) times daily. 12/21/21   [name]., MD  polyethylene glycol (MIRALAX / GLYCOLAX) 17 g packet Take 17 g by mouth at bedtime.    [provider]  sodium bicarbonate 650 MG tablet Take 650 mg by mouth 2 (two) times daily.    [provider]      VITAL SIGNS:  Blood pressure (!) 154/68, pulse (!) 57, temperature 97.8 F (36.6 C), temperature  source Oral, resp. rate 14, height '5\' 10"'$  (1.778 m), weight 112.5 kg, SpO2 93 %.  PHYSICAL EXAMINATION:  Physical Exam  GENERAL:  76 y.o.-year-old Caucasian male patient semi-lying in the bed with mild respiratory distress with conversational dyspnea.  EYES: Pupils equal, round, reactive to light and accommodation. No scleral icterus. Extraocular muscles intact.  HEENT: Head atraumatic, normocephalic. Oropharynx and nasopharynx clear.  NECK:  Supple, no jugular venous distention. No thyroid enlargement, no tenderness.  LUNGS: Diminished bibasal breath is with bibasal rales.  No use of accessory muscles of respiration.  CARDIOVASCULAR: Regular rate and rhythm, S1, S2 normal. No murmurs, rubs, or gallops.  ABDOMEN: Soft, nondistended, nontender. Bowel sounds present. No organomegaly or mass.  EXTREMITIES: 2+ bilateral lower extremity pitting edema with no clubbing or cyanosis. NEUROLOGIC: Cranial nerves II through XII are intact. Muscle strength 5/5 in all extremities. Sensation intact. Gait not checked.  PSYCHIATRIC: The patient is alert and oriented x 3.  Normal affect and good eye contact. SKIN: No obvious rash, lesion, or ulcer.   LABORATORY PANEL:   CBC Recent Labs  Lab 05/28/22 1504  WBC 6.5  HGB 9.1*  HCT 29.4*  PLT 143*   ------------------------------------------------------------------------------------------------------------------  Chemistries  Recent Labs  Lab 05/28/22 1504  NA 143  K 4.5  CL 115*  CO2 22  GLUCOSE 140*  BUN 66*  CREATININE 4.10*  CALCIUM 9.1  AST 46*  ALT 43  ALKPHOS 126  BILITOT 1.4*   ------------------------------------------------------------------------------------------------------------------  Cardiac Enzymes No results for input(s): "TROPONINI" in the last 168 hours. ------------------------------------------------------------------------------------------------------------------  RADIOLOGY:  DG Chest 2 View  Result Date:  05/28/2022 CLINICAL DATA:  Shortness of breath in a 76 year old male. EXAM: CHEST - 2  VIEW COMPARISON:  August 07, 2021 FINDINGS: Post median sternotomy for CABG and LIMA. Cardiomediastinal contours and hilar structures are stable with signs of cardiac enlargement. Diffuse patchy interstitial thickening and patchy opacities throughout the chest greatest in the LEFT mid and lower chest, also present in the RIGHT upper chest, some areas appear vaguely nodular. Biapical pleural and parenchymal scarring. No lobar level consolidative process. No sign of pneumothorax. No pleural effusion. Osteopenia without acute skeletal findings. IMPRESSION: Diffuse mild interstitial thickening and patchy opacities in the chest suspicious for multifocal infection, would also consider asymmetric edema. Given more nodular appearance of LEFT upper lobe and mid chest changes would suggest follow-up PA and lateral chest radiograph outside of the acute setting to ensure resolution. Electronically Signed   By: Zetta Bills M.D.   On: 05/28/2022 15:58      IMPRESSION AND PLAN:  Assessment and Plan: * Acute on chronic combined systolic (congestive) and diastolic (congestive) heart failure ([place])  -The patient will be admitted to a cardiac telemetry bed. - We will continue diuresis with IV Lasix. - We Will follow serial troponins. - We will follow I's and O's and daily weights. - Cardiology consult and 2D echo be obtained since his last 2D echo was in 2020.. - I notified Dr. Caryl Comes about the patient.   Essential hypertension - We will continue his antihypertensives.  Dyslipidemia - We will continue his statin therapy.  Diabetes mellitus with nephropathy ([place]) - He will be placed on supplemental coverage with NovoLog. - She has associated stage IV chronic kidney disease. - We will follow his BMP.  Coronary artery disease - Status post CABG and PCI with stents. - We will continue his Imdur, beta-blocker therapy with  Lopressor as well as statin therapy.  Paroxysmal atrial fibrillation (HCC) - We will continue his Eliquis and amiodarone.  Hypothyroidism - We will continue Synthroid and check TSH level.  GERD without esophagitis - We will continue PPI therapy.   DVT prophylaxis: Lovenox.  Advanced Care Planning:  Code Status: full code.  Family Communication:  The plan of care was discussed in details with the patient (and family). I answered all questions. The patient agreed to proceed with the above mentioned plan. Further management will depend upon hospital course. Disposition Plan: Back to previous home environment Consults called: Cardiology All the records are reviewed and case discussed with ED provider.  Status is: Inpatient   At the time of the admission, it appears that the appropriate admission status for this patient is inpatient.  This is judged to be reasonable and necessary in order to provide the required intensity of service to ensure the patient's safety given the presenting symptoms, physical exam findings and initial radiographic and laboratory data in the context of comorbid conditions.  The patient requires inpatient status due to high intensity of service, high risk of further deterioration and high frequency of surveillance required.  I certify that at the time of admission, it is my clinical judgment that the patient will require inpatient hospital care extending more than 2 midnights.                            Dispo: The patient is from: Home              Anticipated d/c is to: Home              Patient currently is not medically stable to d/c.  Difficult to place patient: No  Christel Mormon M.D on 05/28/2022 at 9:23 PM  [facility]   From 7 PM-7 AM, contact night-coverage [email or web address]  CC: Primary care physician; Glean Hess, MD

## 2022-05-28 NOTE — ED Provider Triage Note (Signed)
Emergency Medicine Provider Triage Evaluation Note  Ronald [name] [facility] , a 76 y.o. male  was evaluated in triage.  Pt complains of cp and sob and tightness in his abdomen. Has SOB with minimal exertion and is now sleeping in a recliner. Sent to ED from Cards who he saw yesterday for volumer overload and hyperkalemia (K on 12/26 was 5.5).  Review of Systems  Positive: CP/SOB/abd distention, DOE Negative: fever  Physical Exam  There were no vitals taken for this visit. Gen:   Awake, no distress   Resp:  Normal effort  MSK:   Moves extremities without difficulty  Other:    Medical Decision Making  Medically screening exam initiated at 2:56 PM.  Appropriate orders placed.  Ronald [name] [facility] was informed that the remainder of the evaluation will be completed by another provider, this initial triage assessment does not replace that evaluation, and the importance of remaining in the ED until their evaluation is complete.     Ronald Old, PA-C 05/28/22 1500

## 2022-05-28 NOTE — ED Provider Notes (Signed)
[facility] Provider Note    Event Date/Time   First MD Initiated Contact with Patient 05/28/22 1947     (approximate)  History   Chief Complaint: Fluid Overloaded  HPI  Ronald [name] is a 76 y.o. male with a past medical history of CAD, CHF, COPD, diabetes, hypertension, hyperlipidemia, presents to the emergency department for worsening shortness of breath.  According to the patient for the past several weeks he has had progressively worsening shortness of breath as well as lower extremity edema.  Patient saw his cardiologist who referred him to the emergency department for further workup and treatment and states he will need to be admitted for IV diuresis.  Patient states shortness of breath at baseline much worse with exertion.  Currently satting 91% on room air with no baseline O2 requirement.  Patient denies any chest pain.   Physical Exam   Triage Vital Signs: ED Triage Vitals  Enc Vitals Group     BP 05/28/22 1459 133/62     Pulse Rate 05/28/22 1459 (!) 54     Resp 05/28/22 1459 17     Temp 05/28/22 1459 97.7 F (36.5 C)     Temp Source 05/28/22 1459 Oral     SpO2 05/28/22 1459 93 %     Weight 05/28/22 1501 248 lb (112.5 kg)     Height 05/28/22 1817 '5\' 10"'$  (1.778 m)     Head Circumference --      Peak Flow --      Pain Score 05/28/22 1500 0     Pain Loc --      Pain Edu? --      Excl. in [place]? --     Most recent vital signs: Vitals:   05/28/22 1459 05/28/22 1816  BP: 133/62 138/66  Pulse: (!) 54 (!) 56  Resp: 17 16  Temp: 97.7 F (36.5 C) 97.8 F (36.6 C)  SpO2: 93% 92%    General: Awake, no distress.  CV:  Good peripheral perfusion.  Regular rate and rhythm  Resp:  Normal effort.  Equal breath sounds bilaterally.  Abd:  No distention.  Soft, nontender.  No rebound or guarding. Other:  2-3+ lower extreme edema bilaterally.   ED Results / Procedures / Treatments   EKG  EKG viewed and interpreted by myself shows sinus  bradycardia at 54 bpm with a narrow QRS, normal axis, slight PR prolongation otherwise normal intervals, nonspecific ST changes without ST elevation.  RADIOLOGY  I have reviewed and interpreted the chest x-ray images.  Patient appears to have some bilateral haziness although I do not see any obvious consolidation. Radiology has read the x-ray is consistent with bilateral opacities suspicious for multifocal infection versus edema.   MEDICATIONS ORDERED IN ED: Medications - No data to display   IMPRESSION / MDM / [place] / ED COURSE  I reviewed the triage vital signs and the nursing notes.  Patient's presentation is most consistent with acute presentation with potential threat to life or bodily function.  Patient presents emergency department for shortness of breath worse when lying down or with exertion better when sitting upright in bed.  Overall the patient appears well, no distress, but satting between 90 and 92% on room air with no exertion.  Patient has significant lower extremity edema and a chest x-ray compatible with pulmonary edema.  Patient's labs have resulted showing a reassuring CBC, troponin is normal.  Patient has chronic renal insufficiency largely unchanged from  baseline on his chemistry.  BNP is elevated to 780.  Given the patient's worsening lower extremity edema, worsening shortness of breath with signs of pulmonary edema on chest x-ray elevated BNP and a room air saturation of around 90 to 91% I believe the patient will require admission for IV diuresis.  Patient was told by his cardiologist that he would need to be admitted for IV diuresis as well.   FINAL CLINICAL IMPRESSION(S) / ED DIAGNOSES   Dyspnea Fluid overload CHF exacerbation   Note:  This document was prepared using Dragon voice recognition software and may include unintentional dictation errors.   Harvest Dark, MD 05/28/22 2204

## 2022-05-28 NOTE — Assessment & Plan Note (Signed)
-   He will be placed on supplemental coverage with NovoLog. - She has associated stage IV chronic kidney disease. - We will follow his BMP.

## 2022-05-28 NOTE — Assessment & Plan Note (Signed)
-   We will continue Synthroid and check TSH level 

## 2022-05-28 NOTE — ED Triage Notes (Signed)
Pt arrived via POV from his heart Dr. Per pt, he wanted me to come to the ED cause of being fluid overloaded and having high potassium.

## 2022-05-28 NOTE — Assessment & Plan Note (Signed)
-   We will continue his Eliquis and amiodarone.

## 2022-05-28 NOTE — Assessment & Plan Note (Signed)
-   We will continue PPI therapy 

## 2022-05-28 NOTE — Assessment & Plan Note (Signed)
-   Status post CABG and PCI with stents. - We will continue his Imdur, beta-blocker therapy with Lopressor as well as statin therapy.

## 2022-05-28 NOTE — Assessment & Plan Note (Signed)
-  The patient will be admitted to a cardiac telemetry bed. - We will continue diuresis with IV Lasix. - We Will follow serial troponins. - We will follow I's and O's and daily weights. - Cardiology consult and 2D echo be obtained since his last 2D echo was in 2020.. - I notified Dr. Caryl Comes about the patient.

## 2022-05-28 NOTE — Assessment & Plan Note (Signed)
-   We will continue his antihypertensives. 

## 2022-05-29 ENCOUNTER — Inpatient Hospital Stay (HOSPITAL_COMMUNITY)
Admit: 2022-05-29 | Discharge: 2022-05-29 | Disposition: A | Discharge status: Home / Self Care | Payer: Medicare Other | Attending: Family Medicine | Admitting: Family Medicine

## 2022-05-29 ENCOUNTER — Encounter: Payer: Self-pay | Admitting: Family Medicine

## 2022-05-29 DIAGNOSIS — I119 Hypertensive heart disease without heart failure: Secondary | ICD-10-CM

## 2022-05-29 DIAGNOSIS — I10 Essential (primary) hypertension: Secondary | ICD-10-CM | POA: Diagnosis not present

## 2022-05-29 DIAGNOSIS — I7121 Aneurysm of the ascending aorta, without rupture: Secondary | ICD-10-CM

## 2022-05-29 DIAGNOSIS — E1121 Type 2 diabetes mellitus with diabetic nephropathy: Secondary | ICD-10-CM | POA: Diagnosis not present

## 2022-05-29 DIAGNOSIS — N184 Chronic kidney disease, stage 4 (severe): Secondary | ICD-10-CM

## 2022-05-29 DIAGNOSIS — I5021 Acute systolic (congestive) heart failure: Secondary | ICD-10-CM

## 2022-05-29 DIAGNOSIS — D631 Anemia in chronic kidney disease: Secondary | ICD-10-CM

## 2022-05-29 DIAGNOSIS — R0789 Other chest pain: Secondary | ICD-10-CM

## 2022-05-29 DIAGNOSIS — D649 Anemia, unspecified: Secondary | ICD-10-CM

## 2022-05-29 DIAGNOSIS — I5043 Acute on chronic combined systolic (congestive) and diastolic (congestive) heart failure: Secondary | ICD-10-CM | POA: Diagnosis not present

## 2022-05-29 DIAGNOSIS — I480 Paroxysmal atrial fibrillation: Secondary | ICD-10-CM | POA: Diagnosis not present

## 2022-05-29 DIAGNOSIS — M48061 Spinal stenosis, lumbar region without neurogenic claudication: Secondary | ICD-10-CM

## 2022-05-29 DIAGNOSIS — I701 Atherosclerosis of renal artery: Secondary | ICD-10-CM

## 2022-05-29 DIAGNOSIS — N189 Chronic kidney disease, unspecified: Secondary | ICD-10-CM

## 2022-05-29 LAB — ECHOCARDIOGRAM COMPLETE
AR max vel: 2.02 cm2
AV Area VTI: 2.07 cm2
AV Area mean vel: 1.92 cm2
AV Mean grad: 6 mmHg
AV Peak grad: 10.6 mmHg
Ao pk vel: 1.63 m/s
Area-P 1/2: 5.34 cm2
Calc EF: 59.8 %
Height: 70 in
MV VTI: 1.72 cm2
S' Lateral: 4.8 cm
Single Plane A2C EF: 60.5 %
Single Plane A4C EF: 59.6 %
Weight: 3968 oz

## 2022-05-29 LAB — CBG MONITORING, ED
Glucose-Capillary: 109 mg/dL — ABNORMAL HIGH (ref 70–99)
Glucose-Capillary: 110 mg/dL — ABNORMAL HIGH (ref 70–99)
Glucose-Capillary: 133 mg/dL — ABNORMAL HIGH (ref 70–99)
Glucose-Capillary: 117 mg/dL — ABNORMAL HIGH (ref 70–99)

## 2022-05-29 LAB — CBC
HCT: 26.1 % — ABNORMAL LOW (ref 39.0–52.0)
Hemoglobin: 8.3 g/dL — ABNORMAL LOW (ref 13.0–17.0)
MCH: 28.6 pg (ref 26.0–34.0)
MCHC: 31.8 g/dL (ref 30.0–36.0)
MCV: 90 fL (ref 80.0–100.0)
Platelets: 124 10*3/uL — ABNORMAL LOW (ref 150–400)
RBC: 2.9 MIL/uL — ABNORMAL LOW (ref 4.22–5.81)
RDW: 16.1 % — ABNORMAL HIGH (ref 11.5–15.5)
WBC: 6.4 10*3/uL (ref 4.0–10.5)
nRBC: 0 % (ref 0.0–0.2)

## 2022-05-29 LAB — BASIC METABOLIC PANEL
Anion gap: 9 (ref 5–15)
BUN: 67 mg/dL — ABNORMAL HIGH (ref 8–23)
CO2: 23 mmol/L (ref 22–32)
Calcium: 9.1 mg/dL (ref 8.9–10.3)
Chloride: 114 mmol/L — ABNORMAL HIGH (ref 98–111)
Creatinine, Ser: 4.11 mg/dL — ABNORMAL HIGH (ref 0.61–1.24)
GFR, Estimated: 14 mL/min — ABNORMAL LOW (ref >60)
Glucose, Bld: 120 mg/dL — ABNORMAL HIGH (ref 70–99)
Potassium: 4 mmol/L (ref 3.5–5.1)
Sodium: 146 mmol/L — ABNORMAL HIGH (ref 135–145)

## 2022-05-29 LAB — PROCALCITONIN: Procalcitonin: 0.19 ng/mL

## 2022-05-29 MED ORDER — PERFLUTREN LIPID MICROSPHERE
1.0000 mL | INTRAVENOUS | Status: AC | PRN
  Administered 2022-05-29: 3 mL via INTRAVENOUS
  Filled 2022-05-29: qty 10

## 2022-05-29 NOTE — ED Notes (Signed)
Pt's oxygen saturation 98-100% on room air while ambulating. Pt denies pain or SHOB.

## 2022-05-29 NOTE — ED Notes (Signed)
Pt's bed repositioned per request and pillow given.  Pt denies other needs at this time.

## 2022-05-29 NOTE — Consult Note (Addendum)
Cardiology Consultation   Patient ID: Ronald [name] [facility] MRN: [id number]; DOB: 02-09-1946  Admit date: 05/28/2022 Date of Consult: 05/29/2022  PCP:  Ronald Hess, MD   [place] Providers Cardiologist:  Ronald Chandler, MD   {  Patient Profile:   Ronald [name] is a 76 y.o. male with a hx of CAD with CABG x 5 in 1996 and redo CABG x 3 in 2007, HFrEF, ischemic cardiomyopathy (LVEF 30-35% by echo 05/2018), CKD stage 4, diabetes type 2, hypertension, hyperlipidemia, AAA, renal artery stenosis, COPD, GERD, PVCs, and paroxysmal A-fib who is being seen 05/29/2022 for the evaluation of chest pain at the request of Dr. Cyndia [name].  History of Present Illness:   [name] patient had CABG x 5 in 1996 and redo bypass x 3 in 2007.  In 2016 he was seen for chest pain and found to have non-STEMI.  iFR was performed and cath showed severe disease in the SVG to OM, and this was treated with drug-eluting stent.  He was started on Brilinta but this caused shortness of breath, and he was changed to Plavix.  The patient was admitted to [facility] in 2017 with unstable angina.  Cardiac cath at that time showed severe disease in the distal body/anastomosis of the SVG to diagonal.  He was treated with drug-eluting stent to the vein graft.  The LIMA to LAD was patent, SVG to OM was patent, SVG to PDA/PLA was patent.  She was admitted in February 2019 with chest pain, troponins negative x 3.  Echo showed EF of 45 to 50%.  In 07/2017 the patient complained of unstable angina.  Left heart cath was performed showing patency of all grafts.  Echo showed EF of 55 to 60% with grade 1 diastolic dysfunction.  Patient was seen at [facility] in 2020 for A-fib with RVR, and was started on Xarelto.  Rate control was achieved with IV Cardizem.  He was discharged with amiodarone.  Patient was later switched from Xarelto to Eliquis.  Echo January 2020 showed LVEF 30 to 35%, mild MR and diffuse hypokinesis with LAD, mildly  reduced RV function.  Patient saw Ronald [name] December 2023 for a flutter.  He underwent cardioversion 05/25/2022 with conversion to sinus rhythm.  Patient was last seen in the office 05/27/2022 for an urgent visit complaining of chest pain and volume overload.  The patient was encouraged to go to the ED.  Patient presented to the ER 12/29 with shortness of breath, lower leg edema, and chest pain. He reported progressive shortness of breath for the last 2-3 weeks. Also noted lower leg edema despite taking lasix daily. He had one night of very brief chest tightness. He also noted some orthopnea. No recent fever, chills, or illnesses.   ER patient was at 91% oxygen on room air.  Blood pressure 133/62, pulse 54 bpm, respiratory rate 17.  Echo showed diffuse mild interstitial thickening suspicious for multifocal infection versus edema.  Labs showed WBC 6.5 hemoglobin 9.1, potassium 4.5, creatinine 4.10, BUN 66, BNP 788.  High-sensitivity troponin negative x 2.  Respiratory panel negative.  Thyroid normal.  EKG showed sinus bradycardia with first-degree AV block, T wave inversions in inferolateral waves.  Past Medical History:  Diagnosis Date   AAA (abdominal aortic aneurysm) ([place])    a. 3cm by [place] 2015.   Arthritis    "hips; back" (12/13/2014)   CAD (coronary artery disease) 2007   a. s/p CABG- IMA-LAD, VG-Cx, VG-RCA, VG-diag in  1999. B. sp redo CABG- VG-OM, VG-RCA in 2007 due to VG disease. c. NSTEMI 11/2014 s/p DES to SVG-OM from the Y graft.d. PTCA/DES x 1 distal body of SVG to Diagonal.09/2015   Chronic combined systolic and diastolic CHF (congestive heart failure) ([place])    a. remote EF 40-45% in 2006. b. Normal EF 2014. b. Echo 07/2016 EF 45-50%, grade 1 DD. c. Echo 2020 30% to 35%. Diffuse EF 30-35%, diffuse hypokinesis.   Chronic lower back pain    CKD (chronic kidney disease), stage IV (HCC)    COPD (chronic obstructive pulmonary disease) (HCC)    Deafness in left ear    Degenerative disc  disease, lumbar    Diabetes mellitus, type 2 ([place]) 10/04/2014   Microalbumin 05/11/2012-100. Foot exam/monofilament 05/11/2012-normal.   Dilated cardiomyopathy ([place]) 10/07/2015   Emphysema    Esophageal stricture 07/02/1998   EGD   Genital candidiasis in male 10/25/2012   GERD (gastroesophageal reflux disease)    History of gout    "last flareup was in 2007" (12/13/2014)   History of hiatal hernia    Hyperlipidemia    Hypertension    Ischemic cardiomyopathy 2006   Echo 2020: EF 30-35%, diffuse hypokinesis   NSTEMI (non-ST elevated myocardial infarction) ([place]) 12/13/2014   PVC's (premature ventricular contractions)    Renal artery stenosis ([place])    a. noted on CT 2008.   Type II diabetes mellitus ([place])    Diet control    Unstable angina ([place]) 07/27/2017   Walking pneumonia 1990's   Wears dentures    full upper    Past Surgical History:  Procedure Laterality Date   CARDIAC CATHETERIZATION  "several"   CARDIAC CATHETERIZATION N/A 12/13/2014   Procedure: Left Heart Cath and Coronary Angiography;  Surgeon: Ronald Booze, MD;  Location: [place] [facility];  Service: Cardiovascular;  Laterality: N/A;   CARDIAC CATHETERIZATION  12/13/2014   Procedure: Coronary Stent Intervention;  Surgeon: Ronald Booze, MD;  Location: [place] [facility];  Service: Cardiovascular;;   CARDIAC CATHETERIZATION N/A 10/07/2015   Procedure: Left Heart Cath and Cors/Grafts Angiography;  Surgeon: Ronald Blanks, MD;  Location: [place] [facility];  Service: Cardiovascular;  Laterality: N/A;   CARDIAC CATHETERIZATION N/A 10/07/2015   Procedure: Coronary Stent Intervention;  Surgeon: Ronald Blanks, MD;  Location: [place] [facility];  Service: Cardiovascular;  Laterality: N/A;   CARDIOVERSION N/A 05/25/2022   Procedure: CARDIOVERSION;  Surgeon: Ronald Salines, DO;  Location: [place];  Service: Cardiovascular;  Laterality: N/A;   CATARACT EXTRACTION W/PHACO Left 10/21/2021   Procedure:  CATARACT EXTRACTION PHACO AND INTRAOCULAR LENS PLACEMENT (IOC) LEFT 3.94 00:33.7;  Surgeon: Ronald Bear, MD;  Location: [place];  Service: Ophthalmology;  Laterality: Left;   CATARACT EXTRACTION W/PHACO Right 11/09/2021   Procedure: CATARACT EXTRACTION PHACO AND INTRAOCULAR LENS PLACEMENT (IOC) RIGHT;  Surgeon: Ronald Bear, MD;  Location: [place];  Service: Ophthalmology;  Laterality: Right;  3.59 0:29.4   CORONARY ANGIOPLASTY  "several"   CORONARY ANGIOPLASTY WITH STENT PLACEMENT  2005; 12/13/2014   "2; 1"   CORONARY ARTERY BYPASS GRAFT  1996   CABG X5   CORONARY ARTERY BYPASS GRAFT  March 2007   CABG X3   ESOPHAGOGASTRODUODENOSCOPY (EGD) WITH ESOPHAGEAL DILATION  2000   GREEN LIGHT LASER TURP (TRANSURETHRAL RESECTION OF PROSTATE  2000's   "not cancerous"   HERNIA REPAIR     LAPAROSCOPIC CHOLECYSTECTOMY     LEFT HEART CATH AND CORS/GRAFTS  ANGIOGRAPHY N/A 07/28/2017   Procedure: LEFT HEART CATH AND CORS/GRAFTS ANGIOGRAPHY;  Surgeon: Ronald Sine, MD;  Location: [place] [facility];  Service: Cardiovascular;  Laterality: N/A;   LUNG SURGERY  1996   "S/P CABG, had to put staple in lung after it had collapsed"   UMBILICAL HERNIA REPAIR     w/chole     Home Medications:  Prior to Admission medications   Medication Sig Start Date End Date Taking? Authorizing Provider  allopurinol (ZYLOPRIM) 100 MG tablet TAKE 1 TABLET BY MOUTH EVERYDAY AT BEDTIME 01/11/22  Yes Ronald [name]., MD  amiodarone (PACERONE) 200 MG tablet Take 400 mg by mouth daily.   Yes [provider]  amLODipine (NORVASC) 10 MG tablet Take 5 mg by mouth daily.   Yes [provider]  apixaban (ELIQUIS) 5 MG TABS tablet Take 1 tablet (5 mg total) by mouth 2 (two) times daily. 09/09/21  Yes Ronald Blanks, MD  atorvastatin (LIPITOR) 40 MG tablet Take 1 tablet (40 mg total) by mouth daily. Patient taking differently: Take 40 mg by mouth at bedtime. 01/11/22  Yes  Ronald [name]., MD  Cholecalciferol (VITAMIN D-3) 125 MCG (5000 UT) TABS Take 2,000 Units by mouth daily.    Yes [provider]  isosorbide mononitrate (IMDUR) 30 MG 24 hr tablet TAKE 1 TABLET BY MOUTH DAILY 01/08/22  Yes Ronald Blanks, MD  levothyroxine (SYNTHROID) 75 MCG tablet Take 1 tablet (75 mcg total) by mouth daily before breakfast. 01/11/22  Yes Ronald [name]., MD  magnesium oxide (MAG-OX) 400 (240 Mg) MG tablet Take 1 tablet (400 mg total) by mouth 2 (two) times daily. 12/07/21  Yes Ronald [name]., MD  metoprolol tartrate (LOPRESSOR) 100 MG tablet TAKE 1 TABLET BY MOUTH TWICE  DAILY 01/08/22  Yes Ronald Blanks, MD  Multiple Vitamin (MULTIVITAMIN PO) Take 1 tablet by mouth daily.   Yes [provider]  pantoprazole (PROTONIX) 40 MG tablet TAKE 2 TABLETS BY MOUTH EVERY DAY Patient taking differently: Take 40 mg by mouth 2 (two) times daily. 12/21/21  Yes Ronald [name]., MD  polyethylene glycol (MIRALAX / GLYCOLAX) 17 g packet Take 17 g by mouth at bedtime.   Yes [provider]  sodium bicarbonate 650 MG tablet Take 650 mg by mouth 2 (two) times daily.   Yes [provider]  acetaminophen (TYLENOL) 650 MG CR tablet Take 1,300 mg by mouth at bedtime.    [provider]  furosemide (LASIX) 40 MG tablet Take 1 tablet (40 mg total) by mouth daily. Patient not taking: Reported on 05/28/2022 05/20/22   Ronald [name] T, PA-C  irbesartan (AVAPRO) 150 MG tablet Take 1 tablet (150 mg total) by mouth daily. Patient not taking: Reported on 05/28/2022 08/07/21   Tanda Rockers, MD  loratadine (CLARITIN) 10 MG tablet Take 10 mg by mouth daily.    [provider]  nitroGLYCERIN (NITROSTAT) 0.4 MG SL tablet Place 0.4 mg under the tongue every 5 (five) minutes as needed for chest pain (Up to 3 times).     [provider]    Inpatient Medications: Scheduled Meds:  allopurinol  100 mg Oral QHS    amiodarone  400 mg Oral Daily   amLODipine  5 mg Oral Daily   apixaban  5 mg Oral BID   atorvastatin  40 mg Oral QHS   cholecalciferol  2,000 Units Oral Daily   furosemide  40  mg Intravenous Q12H   insulin aspart  0-5 Units Subcutaneous QHS   insulin aspart  0-9 Units Subcutaneous TID WC   isosorbide mononitrate  30 mg Oral Daily   levothyroxine  75 mcg Oral QAC breakfast   loratadine  10 mg Oral Daily   magnesium oxide  400 mg Oral BID   metoprolol tartrate  100 mg Oral BID   multivitamin with minerals  1 tablet Oral Daily   pantoprazole  40 mg Oral BID   polyethylene glycol  17 g Oral QHS   sodium bicarbonate  650 mg Oral BID   Continuous Infusions:  PRN Meds: acetaminophen **OR** acetaminophen, nitroGLYCERIN, ondansetron **OR** ondansetron (ZOFRAN) IV, traZODone  Allergies:    Allergies  Allergen Reactions   Predicort [Prednisolone] Other (See Comments)    Stomach pain   Ciprofloxacin Other (See Comments)    GI upset   Hydrochlorothiazide Other (See Comments)    Dehydration   Hydrocodone Nausea Only and Other (See Comments)    Stomach upset    Hydrocodone-Acetaminophen Nausea Only    Stomach upset   Sulfa Antibiotics Other (See Comments)    Cannot recall   Hydrocodone-Acetaminophen Nausea Only and Other (See Comments)    Stomach upset   Penicillins Hives, Rash and Other (See Comments)    Has patient had a PCN reaction causing immediate rash, facial/tongue/throat swelling, SOB or lightheadedness with hypotension: YES  Has patient had a PCN reaction causing severe rash involving mucus membranes or skin necrosis: NO  Has patient had a PCN reaction that required hospitalization NO  Has patient had a PCN reaction occurring within the last 10 years:NO  If all of the above answers are "NO", then may proceed with Cephalosporin use.  Has patient had a PCN reaction causing immediate rash, facial/tongue/throat swelling, SOB or lightheadedness with hypotension: YES Has  patient had a PCN reaction causing severe rash involving mucus membranes or skin necrosis: NO Has patient had a PCN reaction that required hospitalization NO Has patient had a PCN reaction occurring within the last 10 years:NO If all of the above answers are "NO", then may proceed with Cephalosporin use.    Social History:   Social History   Socioeconomic History   Marital status: Married    Spouse name: Not on file   Number of children: 2   Years of education: Not on file   Highest education level: 8th grade  Occupational History   Occupation: Retired  Tobacco Use   Smoking status: Former    Packs/day: 3.00    Years: 20.00    Total pack years: 60.00    Types: Cigarettes    Quit date: 07/25/1986    Years since quitting: 35.8   Smokeless tobacco: Never  Vaping Use   Vaping Use: Never used  Substance and Sexual Activity   Alcohol use: No    Alcohol/week: 0.0 standard drinks of alcohol   Drug use: No   Sexual activity: Not Currently  Other Topics Concern   Not on file  Social History Narrative   Did auto salvage work.   Lives at home with his wife.  Independent at baseline.   Social Determinants of Health   Financial Resource Strain: Low Risk  (10/28/2021)   Overall Financial Resource Strain (CARDIA)    Difficulty of Paying Living Expenses: Not hard at all  Food Insecurity: No Food Insecurity (10/28/2021)   Hunger Vital Sign    Worried About Running Out of Food in the Last Year: Never  true    Ran Out of Food in the Last Year: Never true  Transportation Needs: No Transportation Needs (10/28/2021)   PRAPARE - Hydrologist (Medical): No    Lack of Transportation (Non-Medical): No  Physical Activity: Inactive (04/28/2020)   Exercise Vital Sign    Days of Exercise per Week: 0 days    Minutes of Exercise per Session: 0 min  Stress: No Stress Concern Present (10/28/2021)   [place]     Feeling of Stress : Not at all  Social Connections: Moderately Isolated (10/28/2021)   Social Connection and Isolation Panel [NHANES]    Frequency of Communication with Friends and Family: Twice a week    Frequency of Social Gatherings with Friends and Family: Twice a week    Attends Religious Services: Never    Marine scientist or Organizations: No    Attends Archivist Meetings: Never    Marital Status: Married  Human resources officer Violence: Not At Risk (10/28/2021)   Humiliation, Afraid, Rape, and Kick questionnaire    Fear of Current or Ex-Partner: No    Emotionally Abused: No    Physically Abused: No    Sexually Abused: No    Family History:    Family History  Problem Relation Age of Onset   Heart attack Mother        MI   Stroke Mother    Heart disease Mother    Hypertension Mother    Hyperlipidemia Mother    Asthma Mother    Heart disease Father    Rheumatic fever Father    Colon cancer Neg Hx      ROS:  Please see the history of present illness.   All other ROS reviewed and negative.     Physical Exam/Data:   Vitals:   05/28/22 2200 05/28/22 2329 05/29/22 0200 05/29/22 0402  BP: (!) 158/71 (!) 148/62 (!) 118/57 115/80  Pulse:  60 (!) 56 (!) 56  Resp: '18  20 17  '$ Temp:    97.8 F (36.6 C)  TempSrc:    Oral  SpO2: 94%  92% 93%  Weight:      Height:        Intake/Output Summary (Last 24 hours) at 05/29/2022 0711 Last data filed at 05/29/2022 0649 Gross per 24 hour  Intake --  Output 1650 ml  Net -1650 ml      05/28/2022    6:17 PM 05/28/2022    3:01 PM 05/27/2022    2:25 PM  Last 3 Weights  Weight (lbs) 248 lb 248 lb 248 lb 3.2 oz  Weight (kg) 112.492 kg 112.492 kg 112.583 kg     Body mass index is 35.58 kg/m.  General:  Well nourished, well developed, in no acute distress HEENT: normal Neck: + JVD Vascular: No carotid bruits; Distal pulses 2+ bilaterally Cardiac:  normal S1, S2; RRR; no murmur  Lungs:  clear to  auscultation bilaterally, no wheezing, rhonchi or rales  Abd: soft, nontender, no hepatomegaly  Ext: 1+ lower leg edema Musculoskeletal:  No deformities, BUE and BLE strength normal and equal Skin: warm and dry  Neuro:  CNs 2-12 intact, no focal abnormalities noted Psych:  Normal affect   EKG:  The EKG was personally reviewed and demonstrates:  SB 54bpm, TWI inferolateral leads Telemetry:  Telemetry was personally reviewed and demonstrates:  NSR 70-80s  Relevant CV Studies:  Echo limited 05/2018 Study Conclusions   -  Left ventricle: The cavity size was moderately dilated. Systolic    function was moderately to severely reduced. The estimated    ejection fraction was in the range of 30% to 35%. Diffuse    hypokinesis. The study is not technically sufficient to allow    evaluation of LV diastolic function.  - Mitral valve: There was mild regurgitation.  - Left atrium: The atrium was mildly dilated.  - Right ventricle: The cavity size was mildly dilated. Wall    thickness was normal. Systolic function was mildly reduced.   Echo 05/2018  Study Conclusions   - Left ventricle: The cavity size was normal. There was moderate    concentric hypertrophy. Systolic function was moderately reduced.    The estimated ejection fraction was in the range of 35% to 40%.    Images were inadequate for LV wall motion assessment. The study    was not technically sufficient to allow evaluation of LV    diastolic dysfunction due to atrial fibrillation.  - Mitral valve: There was mild regurgitation.  - Left atrium: The atrium was mildly dilated.  - Pulmonary arteries: Systolic pressure was mildly increased. PA    peak pressure: 35 mm Hg (S).   Cardiac cath 2019 Ost RCA to Prox RCA lesion is 95% stenosed. Prox RCA lesion is 100% stenosed. Prox Cx lesion is 100% stenosed. Prox LAD lesion is 100% stenosed. Origin lesion is 100% stenosed. Origin lesion is 100% stenosed. Previously placed Dist Graft to  Insertion stent (unknown type) is widely patent. Origin to Prox Graft lesion is 10% stenosed. Prox Graft lesion is 30% stenosed. Mid Graft to Dist Graft lesion is 30% stenosed. Mid Graft lesion is 50% stenosed.   Significant native CAD with total occlusion of the LAD after the takeoff of the first diagonal vessel; total occlusion of the proximal left circumflex coronary artery; and total occlusion of the proximal RCA with antegrade bridging collaterals.   Patent LIMA to LAD.   Patent Y vein graft from the 1996 surgery which supplies the diagonal vessel and distal circumflex marginal vessel.  There is diffuse narrowing of 50% in the midportion of the Y graft supplying the diagonal vessel and a patent distal stent extending to the ostium of the diagonal.  The Y graft supplying the distal marginal is free of significant disease and has mild luminal irregularity.   Occluded vein graft which had supplied the distal RCA from the 1996 surgery.   Patent SVG supplying the distal RCA from the 2007 surgery with a stent in the proximal portion of the graft with 30% narrowings in the proximal and mid segment.   Occluded vein graft from 2007 which appears to also have been stented.   LVEDP 18 mm   RECOMMENDATION: Increase medical therapy.  Coronary Diagrams  Diagnostic Dominance: Right  Intervention    Echo 2019 Study Conclusions   - Left ventricle: The cavity size was mildly dilated. Systolic    function was normal. The estimated ejection fraction was in the    range of 55% to 60%. Doppler parameters are consistent with    abnormal left ventricular relaxation (grade 1 diastolic    dysfunction).   Laboratory Data:  High Sensitivity Troponin:   Recent Labs  Lab 05/28/22 1504 05/28/22 1821  TROPONINIHS 12 13     Chemistry Recent Labs  Lab 05/25/22 0843 05/28/22 1504 05/29/22 0404  NA 139 143 146*  K 5.5* 4.5 4.0  CL 107 115* 114*  CO2 25 22  23  GLUCOSE 141* 140* 120*  BUN  56* 66* 67*  CREATININE 4.23* 4.10* 4.11*  CALCIUM 9.3 9.1 9.1  GFRNONAA 14* 14* 14*  ANIONGAP '7 6 9    '$ Recent Labs  Lab 05/28/22 1504  PROT 6.8  ALBUMIN 3.5  AST 46*  ALT 43  ALKPHOS 126  BILITOT 1.4*   Lipids No results for input(s): "CHOL", "TRIG", "HDL", "LABVLDL", "LDLCALC", "CHOLHDL" in the last 168 hours.  Hematology Recent Labs  Lab 05/25/22 0831 05/28/22 1504 05/29/22 0404  WBC 8.5 6.5 6.4  RBC 3.37* 3.22* 2.90*  HGB 9.6* 9.1* 8.3*  HCT 31.1* 29.4* 26.1*  MCV 92.3 91.3 90.0  MCH 28.5 28.3 28.6  MCHC 30.9 31.0 31.8  RDW 16.3* 16.4* 16.1*  PLT 134* 143* 124*   Thyroid  Recent Labs  Lab 05/28/22 1821  TSH 1.951    BNP Recent Labs  Lab 05/28/22 1504  BNP 788.6*    DDimer No results for input(s): "DDIMER" in the last 168 hours.   Radiology/Studies:  DG Chest 2 View  Result Date: 05/28/2022 CLINICAL DATA:  Shortness of breath in a 76 year old male. EXAM: CHEST - 2 VIEW COMPARISON:  August 07, 2021 FINDINGS: Post median sternotomy for CABG and LIMA. Cardiomediastinal contours and hilar structures are stable with signs of cardiac enlargement. Diffuse patchy interstitial thickening and patchy opacities throughout the chest greatest in the LEFT mid and lower chest, also present in the RIGHT upper chest, some areas appear vaguely nodular. Biapical pleural and parenchymal scarring. No lobar level consolidative process. No sign of pneumothorax. No pleural effusion. Osteopenia without acute skeletal findings. IMPRESSION: Diffuse mild interstitial thickening and patchy opacities in the chest suspicious for multifocal infection, would also consider asymmetric edema. Given more nodular appearance of LEFT upper lobe and mid chest changes would suggest follow-up PA and lateral chest radiograph outside of the acute setting to ensure resolution. Electronically Signed   By: Zetta Bills M.D.   On: 05/28/2022 15:58     Assessment and Plan:   Volume overload Acute on  chronic HFrEF Ischemic cardiomyopathy - presented from the office with shortness of breath, LLE  for the last 3 weeks and 1 episode of chest pain - In the ER BNP elevated to 788. CXR with possible edema. HS trop negative x 2. -Echo from 2019 showed LVEF 45 to 32%, grade 1 diastolic dysfunction -Echo from 2019 showed LVEF 55 to 67%, grade 1 diastolic dysfunction -Complete echo 06/12/2018 showed LVEF 35 to 40%, mild MR -Limited echo 06/15/2018 showed LVEF 30 to 35%, mild MR - repeat echo ordered -PTA Lasix '40mg'$  daily - started on IV lasix '40mg'$  BID - Irbesartan held - continue PTA lopressor '100mg'$  BID - UOP -1.6L - continue with diuresis. Continue to monitor kidney function given CKD stage 4. May need to get nephrology input  Atypical Chest pain CAD s/p cABGx5 with redo CABGx3 in 2007 - [place] in 2019 showed no PCI targets, medical management recommended - chest pain more atypical - HS trop negative - repeat echo - No ASA with Eliquis - continue BB and statin therapy. Continue Imdur '30mg'$  daily - Given CKD patient is not a good cath candidate.   CKD stage 4 - Scr 4.11, BUN 67, GFR 14 - follows with nephrology as OP> may need to consult nephrology  Paroxysmal Afib s/p DCCV 05/25/22 - remains in SB - continue Amiodarone 200 mg daily  Hypertension -Bps elevated at times - PTA Amlodipine 5 mg daily, Imdur 30  mg daily, Lopressor 100 mg twice daily, irbesartan 150 mg daily - hold amlodipine given low EF - ARB held for diuresus  Hyperlipidemia - LDL 82 10/2021 -Lipitor 40 mg daily  Anemia - Hgb in May 2023 was 12.6 - Hgb this admission down to 8.3 - further work-up per IM  For questions or updates, please contact [place] Please consult [email or web address] for contact info under    Signed, Cadence Arlyss Repress  05/29/2022 7:11 AM   Cardiology Attending  Patient seen and examined. Agree with above. The patient appears to be back to baseline. He remains in NSR after  DCCV. He walked in the hall and felt well by his report. He denies anginal symptoms. His potassium in the ED is now normal twice. Unclear if his office potassium was hemolyzed. I would recommend he go home on all of his home meds except hold irbesartan. He has followup with the nephrologist next week. He is not volume overloaded. Continue the beta blocker and amio. He will need a repeat of his HGB as an outpatient.  We will sign off.  Ronald Overlie Kota Ciancio,MD

## 2022-05-29 NOTE — Evaluation (Signed)
Occupational Therapy Evaluation Patient Details Name: Ronald [name] [facility] MRN: [id number] DOB: 1945/11/28 Today's Date: 05/29/2022   History of Present Illness Ronald [name] is a 76 y.o. male with medical history significant for combined systolic and diastolic CHF, coronary artery disease s/p CABG, s/p PCI and stents, COPD, paroxysmal atrial fibrillation on Eliquis, stage IV chronic kidney disease, hypertension, dyslipidemia, chronic low back pain and type 2 diabetes mellitus, who presented to the emergency room with acute onset of worsening dyspnea over the last 3 weeks.   Clinical Impression   Ronald [name] was seen for OT evaluation this date. Prior to hospital admission, pt was independent in all aspects of ADL/IADL management, he denies AE use for functional mobility but endorses ~3 falls in last 6 months all when in the community or climbing bleachers/steps. Pt lives with his spouse in a 1 story home with 5 steps to enter with L&R hand rail. Currently pt reporting symptoms have generally resolved aside from some moderate LE edema. Pt is followed by nephrology. Pt demonstrates baseline independence to perform ADL and mobility tasks and no strength, sensory, coordination, cognitive, or visual deficits appreciated with assessment. Pt/family educated on falls prevention strategies for home and hospital and all return verbalize understanding of education provided. No further skilled OT needs identified. Will sign off. Please re-consult if additional OT needs arise during this hospital stay.       Recommendations for follow up therapy are one component of a multi-disciplinary discharge planning process, led by the attending physician.  Recommendations may be updated based on patient status, additional functional criteria and insurance authorization.   Follow Up Recommendations  No OT follow up     Assistance Recommended at Discharge None  Patient can return home with the following      Functional  Status Assessment  Patient has not had a recent decline in their functional status  Equipment Recommendations  None recommended by OT    Recommendations for Other Services       Precautions / Restrictions Precautions Precautions: Fall Precaution Comments: low fall Restrictions Weight Bearing Restrictions: No      Mobility Bed Mobility Overal bed mobility: Independent                  Transfers Overall transfer level: Independent                        Balance Overall balance assessment: No apparent balance deficits (not formally assessed)                                         ADL either performed or assessed with clinical judgement   ADL Overall ADL's : At baseline;Independent                                       General ADL Comments: Pt presents to OT services fully dressed with street shoes donned. He is able to ambulate to hall bathroom (in ED) with nsg staff independently, performs functional mobility in room/hall without AE. Endorses feeling back to baseline level of functional independence for ADL management.     Vision Baseline Vision/History: 1 Wears glasses Ability to See in Adequate Light: 1 Impaired Patient Visual Report: No change from baseline  Perception     Praxis      Pertinent Vitals/Pain Pain Assessment Pain Assessment: No/denies pain     Hand Dominance     Extremity/Trunk Assessment Upper Extremity Assessment Upper Extremity Assessment: Overall WFL for tasks assessed   Lower Extremity Assessment Lower Extremity Assessment: Overall WFL for tasks assessed   Cervical / Trunk Assessment Cervical / Trunk Assessment: Kyphotic   Communication Communication Communication: No difficulties   Cognition Arousal/Alertness: Awake/alert Behavior During Therapy: WFL for tasks assessed/performed Overall Cognitive Status: Within Functional Limits for tasks assessed                                  General Comments: A&Ox4, pleasant, conversational, supportive family at bedside.     General Comments       Exercises Other Exercises Other Exercises: Pt/family educated on falls prevention strategies for home and hospital.   Shoulder Instructions      Home Living Family/patient expects to be discharged to:: Private residence Living Arrangements: Spouse/significant other Available Help at Discharge: Family;Available 24 hours/day Type of Home: House Home Access: Stairs to enter [employer] of Steps: 5 Entrance Stairs-Rails: Right;Left Home Layout: One level     Bathroom Shower/Tub: Tub/shower unit         Home Equipment: BSC/3in1          Prior Functioning/Environment Prior Level of Function : Independent/Modified Independent;Driving             Mobility Comments: Active, independent. Community ambulator without AE. ADLs Comments: Independent. Enjoys going to races. Endorses ~3 falls in last year all while going up/down steps or his camper.        OT Problem List: Decreased strength;Cardiopulmonary status limiting activity;Decreased safety awareness      OT Treatment/Interventions:      OT Goals(Current goals can be found in the care plan section) Acute Rehab OT Goals Patient Stated Goal: to go home ASAP OT Goal Formulation: All assessment and education complete, DC therapy Time For Goal Achievement: 05/29/22 Potential to Achieve Goals: Good  OT Frequency:      Co-evaluation              AM-PAC OT "6 Clicks" Daily Activity     Outcome Measure Help from another person eating meals?: None Help from another person taking care of personal grooming?: None Help from another person toileting, which includes using toliet, bedpan, or urinal?: None Help from another person bathing (including washing, rinsing, drying)?: None Help from another person to put on and taking off regular upper body clothing?: None Help from  another person to put on and taking off regular lower body clothing?: None 6 Click Score: 24   End of Session    Activity Tolerance: Patient tolerated treatment well Patient left: in bed;with family/visitor present (pt recieved seated EOB in ED with family present, no falls alarms in place in gurney bed.)  OT Visit Diagnosis: Other abnormalities of gait and mobility (R26.89)                Time: 7412-8786 OT Time Calculation (min): 11 min Charges:  OT General Charges $OT Visit: 1 Visit OT Evaluation $OT Eval Low Complexity: 1 Low  Shara Blazing, M.S., OTR/L 05/29/22, 3:00 PM

## 2022-05-29 NOTE — Progress Notes (Signed)
PROGRESS NOTE  Normal Ronald [name] KDT:[id number] DOB: [birth year]   PCP: Ronald [name], Ronald [name] is from: Home.  Lives with his wife.  DOA: 05/28/2022 LOS: 1  Chief complaints Chief Complaint  [name] presents with   Fluid Overloaded     Brief Narrative / Interim history: 76 year old M with PMH of CAD/CABG in 1996 and 2007, combined CHF/ICM, CKD-4, COPD, DM-2, paroxysmal A-fib on Eliquis, PVCs, renal artery stenosis, HTN, HLD, TAA, morbid obesity and chronic back pain presenting with progressive shortness of breath for about 3 weeks, dry cough, orthopnea, PND, edema and about 6 pounds weight gain in 2 days, and admitted for acute on chronic combined CHF.  Of note, wife reported that his Lasix was discontinued 3 days prior due to elevated potassium.  BNP 790.  Troponin negative x 2.  CXR showed diffuse mild interstitial thickening and patchy opacities in the chest suspicious for multifocal infection or asymmetric edema.  [name] has no fever or leukocytosis to suggest infection.  He was started on IV Lasix.  Echocardiogram ordered.  Cardiology consulted and following.  Nephrology consulted as well.  Remains on IV Lasix.  Subjective: Seen and examined earlier this morning.  No major events overnight or this morning.  No further chest pain.  Breathing has improved.  He seems to be lying flat in bed without distress.  Still with BLE edema, right> left.  Per wife, asymmetry is chronic.  Objective: Vitals:   05/29/22 0402 05/29/22 0736 05/29/22 1110 05/29/22 1135  BP: 115/80 (!) 149/59 139/78   Pulse: (!) 56 (!) 57 (!) 55   Resp: '17 16 14   '$ Temp: 97.8 F (36.6 C) 97.9 F (36.6 C)    TempSrc: Oral Oral    SpO2: 93% 99% 98% 98%  Weight:      Height:        Examination:  GENERAL: No apparent distress.  Nontoxic. HEENT: MMM.  Vision grossly intact.  Diminished hearing. NECK: Supple.  Notable JVD. RESP: On RA.  No IWOB.  Fair aeration bilaterally. CVS:  RRR. Heart sounds normal.   ABD/GI/GU: BS+. Abd soft, NTND.  MSK/EXT:  Moves extremities. No apparent deformity.  1+ RLE edema.  Trace LLE edema. SKIN: no apparent skin lesion or wound NEURO: Sleepy but wakes to voice.  Oriented appropriately.  No apparent focal neuro deficit. PSYCH: Calm. Normal affect.   Procedures:  None  Microbiology summarized: [id number], influenza and RSV PCR nonreactive.  Assessment and plan: Principal Problem:   Acute on chronic combined systolic (congestive) and diastolic (congestive) heart failure (HCC) Active Problems:   Hypertensive heart disease without CHF   Essential hypertension   Dyslipidemia   Diabetes mellitus with nephropathy (HCC)   Paroxysmal atrial fibrillation (HCC)   Coronary artery disease   Hypothyroidism   Renal artery stenosis    History of redo bypass grafting   Spinal stenosis of lumbar region without neurogenic claudication   Morbid obesity (HCC)   Thoracic aortic aneurysm (TAA) (HCC)   Chronic kidney disease, stage 4 (severe) (HCC)   GERD without esophagitis  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  Wife reports discontinuation of furosemide due to high potassium about 3 days prior.  TTE in 05/2018 with LVEF of 30 to 35%, indeterminate DD.  BNP 790 but no recent values for comparison.  Personally reviewed CXR favors pulmonary edema versus pneumonia.  Pro-Cal only 0.19.  Started on IV Lasix.  About  1.7 L UOP overnight.  Symptoms improved.  However, still with JVD and significant BLE edema.  Creatinine and BP stable. -Continue IV Lasix 40 mg twice daily -Continue Imdur, metoprolol, atorvastatin -Hold Avapro given his renal function. -Strict intake and output, daily weight, renal functions and electrolytes -Follow echocardiogram  -Follow cardiology recommendations -PT/OT eval  History of CAD/CABG in 1996 and 2007.  Initial chest pain likely from CHF.  Serial troponin negative.  Chest  pain resolved. -Continue beta-blocker, Imdur, Lasix, statin  Paroxysmal A-fib: Rate controlled. -Continue amiodarone, metoprolol and Eliquis. -Optimize electrolytes.  Essential hypertension: BP within acceptable range. -Cardiac meds as above.   Controlled NIDDM-2 with CKD-4 and HLD: A1c 5.7% in 10/2021.  Does not seem to be on medication.  CBG within acceptable range. Recent Labs  Lab 05/28/22 2343 05/29/22 0733  GLUCAP 109* 110*  -Recheck hemoglobin A1c -May discontinue CBG monitoring if CBG stable and A1c stable. -Continue home statin.  CKD-4: Renal function stable. Recent Labs    08/07/21 1536 08/12/21 1351 11/24/21 1625 05/19/22 1548 05/25/22 0843 05/28/22 1504 05/29/22 0404  BUN 45* 38* 58* 50* 56* 66* 67*  CREATININE 3.47* 2.93* 3.66* 4.16* 4.23* 4.10* 4.11*  -Continue monitoring -Nephrology consulted.  Anemia of renal disease: denies GI bleed, melena or hematochezia.  However, he is on Eliquis. Recent Labs    08/07/21 1536 10/01/21 1440 05/19/22 1548 05/25/22 0831 05/28/22 1504 05/29/22 0404  HGB 12.0* 12.6* 9.9* 9.6* 9.1* 8.3*  -Recheck CBC this afternoon -Check anemia panel -May benefit from Epo.  Nephrology consulted.  Hypothyroidism -Continue home Synthroid   GERD without esophagitis -Continue PPI therapy.     Morbid obesity Body mass index is 35.58 kg/m.     DVT prophylaxis:   apixaban (ELIQUIS) tablet 5 mg  Code Status: Full code Family Communication: Updated [name]'s wife at bedside Level of care: Telemetry Cardiac Status is: Inpatient Remains inpatient appropriate because: Acute on chronic combined CHF requiring IV diuretics   Final disposition: TBD Consultants:  Cardiology Nephrology  Sch Meds:  Scheduled Meds:  allopurinol  100 mg Oral QHS   amiodarone  400 mg Oral Daily   apixaban  5 mg Oral BID   atorvastatin  40 mg Oral QHS   cholecalciferol  2,000 Units Oral Daily   furosemide  40 mg Intravenous Q12H   insulin  aspart  0-5 Units Subcutaneous QHS   insulin aspart  0-9 Units Subcutaneous TID WC   isosorbide mononitrate  30 mg Oral Daily   levothyroxine  75 mcg Oral QAC breakfast   loratadine  10 mg Oral Daily   magnesium oxide  400 mg Oral BID   metoprolol tartrate  100 mg Oral BID   multivitamin with minerals  1 tablet Oral Daily   pantoprazole  40 mg Oral BID   polyethylene glycol  17 g Oral QHS   sodium bicarbonate  650 mg Oral BID   Continuous Infusions: PRN Meds:.acetaminophen **OR** acetaminophen, nitroGLYCERIN, ondansetron **OR** ondansetron (ZOFRAN) IV, traZODone  Antimicrobials: Anti-infectives (From admission, onward)    None        I have personally reviewed the following labs and images: CBC: Recent Labs  Lab 05/25/22 0831 05/28/22 1504 05/29/22 0404  WBC 8.5 6.5 6.4  NEUTROABS  --  4.9  --   HGB 9.6* 9.1* 8.3*  HCT 31.1* 29.4* 26.1*  MCV 92.3 91.3 90.0  PLT 134* 143* 124*   BMP &GFR Recent Labs  Lab 05/25/22 0843 05/28/22 1504 05/29/22 0404  NA  139 143 146*  K 5.5* 4.5 4.0  CL 107 115* 114*  CO2 '25 22 23  '$ GLUCOSE 141* 140* 120*  BUN 56* 66* 67*  CREATININE 4.23* 4.10* 4.11*  CALCIUM 9.3 9.1 9.1   Estimated Creatinine Clearance: 18.6 mL/min (A) (by C-G formula based on SCr of 4.11 mg/dL (H)). Liver & Pancreas: Recent Labs  Lab 05/28/22 1504  AST 46*  ALT 43  ALKPHOS 126  BILITOT 1.4*  PROT 6.8  ALBUMIN 3.5   No results for input(s): "LIPASE", "AMYLASE" in the last 168 hours. No results for input(s): "AMMONIA" in the last 168 hours. Diabetic: No results for input(s): "HGBA1C" in the last 72 hours. Recent Labs  Lab 05/28/22 2343 05/29/22 0733  GLUCAP 109* 110*   Cardiac Enzymes: No results for input(s): "CKTOTAL", "CKMB", "CKMBINDEX", "TROPONINI" in the last 168 hours. No results for input(s): "PROBNP" in the last 8760 hours. Coagulation Profile: No results for input(s): "INR", "PROTIME" in the last 168 hours. Thyroid Function  Tests: Recent Labs    05/28/22 1821  TSH 1.951   Lipid Profile: No results for input(s): "CHOL", "HDL", "LDLCALC", "TRIG", "CHOLHDL", "LDLDIRECT" in the last 72 hours. Anemia Panel: No results for input(s): "VITAMINB12", "FOLATE", "FERRITIN", "TIBC", "IRON", "RETICCTPCT" in the last 72 hours. Urine analysis:    Component Value Date/Time   COLORURINE YELLOW (A) 05/28/2022 1501   APPEARANCEUR CLEAR (A) 05/28/2022 1501   LABSPEC 1.013 05/28/2022 1501   PHURINE 6.0 05/28/2022 1501   GLUCOSEU NEGATIVE 05/28/2022 1501   HGBUR NEGATIVE 05/28/2022 1501   BILIRUBINUR NEGATIVE 05/28/2022 1501   KETONESUR NEGATIVE 05/28/2022 1501   PROTEINUR NEGATIVE 05/28/2022 1501   NITRITE NEGATIVE 05/28/2022 1501   LEUKOCYTESUR NEGATIVE 05/28/2022 1501   Sepsis Labs: Invalid input(s): "PROCALCITONIN", "LACTICIDVEN"  Microbiology: Recent Results (from the past 240 hour(s))  Resp panel by RT-PCR (RSV, Flu A&B, Covid) Anterior Nasal Swab     Status: None   Collection Time: 05/28/22  8:19 PM   Specimen: Anterior Nasal Swab  Result Value Ref Range Status   SARS Coronavirus 2 by RT PCR NEGATIVE NEGATIVE Final    Comment: (NOTE) SARS-CoV-2 target nucleic acids are NOT DETECTED.  The SARS-CoV-2 RNA is generally detectable in upper respiratory specimens during the acute phase of infection. The lowest concentration of SARS-CoV-2 viral copies this assay can detect is 138 copies/mL. A negative result does not preclude SARS-Cov-2 infection and should not be used as the sole basis for treatment or other [name] management decisions. A negative result may occur with  improper specimen collection/handling, submission of specimen other than nasopharyngeal swab, presence of viral mutation(s) within the areas targeted by this assay, and inadequate number of viral copies(<138 copies/mL). A negative result must be combined with clinical observations, [name] history, and epidemiological information. The expected  result is Negative.  Fact Sheet for Patients:  EntrepreneurPulse.com.au  Fact Sheet for Healthcare Providers:  IncredibleEmployment.be  This test is no t yet approved or cleared by the [place] FDA and  has been authorized for detection and/or diagnosis of SARS-CoV-2 by FDA under an Emergency Use Authorization (EUA). This EUA will remain  in effect (meaning this test can be used) for the duration of the COVID-19 declaration under Section 564(b)(1) of the Act, 21 U.S.C.section 360bbb-3(b)(1), unless the authorization is terminated  or revoked sooner.       Influenza A by PCR NEGATIVE NEGATIVE Final   Influenza B by PCR NEGATIVE NEGATIVE Final    Comment: (NOTE) The Xpert Xpress  SARS-CoV-2/FLU/RSV plus assay is intended as an aid in the diagnosis of influenza from Nasopharyngeal swab specimens and should not be used as a sole basis for treatment. Nasal washings and aspirates are unacceptable for Xpert Xpress SARS-CoV-2/FLU/RSV testing.  Fact Sheet for Patients: EntrepreneurPulse.com.au  Fact Sheet for Healthcare Providers: IncredibleEmployment.be  This test is not yet approved or cleared by the [place] FDA and has been authorized for detection and/or diagnosis of SARS-CoV-2 by FDA under an Emergency Use Authorization (EUA). This EUA will remain in effect (meaning this test can be used) for the duration of the COVID-19 declaration under Section 564(b)(1) of the Act, 21 U.S.C. section 360bbb-3(b)(1), unless the authorization is terminated or revoked.     Resp Syncytial Virus by PCR NEGATIVE NEGATIVE Final    Comment: (NOTE) Fact Sheet for Patients: EntrepreneurPulse.com.au  Fact Sheet for Healthcare Providers: IncredibleEmployment.be  This test is not yet approved or cleared by the [place] FDA and has been authorized for detection and/or diagnosis of  SARS-CoV-2 by FDA under an Emergency Use Authorization (EUA). This EUA will remain in effect (meaning this test can be used) for the duration of the COVID-19 declaration under Section 564(b)(1) of the Act, 21 U.S.C. section 360bbb-3(b)(1), unless the authorization is terminated or revoked.  Performed at [facility], [address], [place], [place] [address]     Radiology Studies: DG Chest 2 View  Result Date: 05/28/2022 CLINICAL DATA:  Shortness of breath in a 76 year old male. EXAM: CHEST - 2 VIEW COMPARISON:  August 07, 2021 FINDINGS: Post median sternotomy for CABG and LIMA. Cardiomediastinal contours and hilar structures are stable with signs of cardiac enlargement. Diffuse patchy interstitial thickening and patchy opacities throughout the chest greatest in the LEFT mid and lower chest, also present in the RIGHT upper chest, some areas appear vaguely nodular. Biapical pleural and parenchymal scarring. No lobar level consolidative process. No sign of pneumothorax. No pleural effusion. Osteopenia without acute skeletal findings. IMPRESSION: Diffuse mild interstitial thickening and patchy opacities in the chest suspicious for multifocal infection, would also consider asymmetric edema. Given more nodular appearance of LEFT upper lobe and mid chest changes would suggest follow-up PA and lateral chest radiograph outside of the acute setting to ensure resolution. Electronically Signed   By: Zetta Bills M.D.   On: 05/28/2022 15:58      Ayerim Berquist T. [place]  If 7PM-7AM, please contact night-coverage [email or web address] 05/29/2022, 11:36 AM

## 2022-05-29 NOTE — ED Notes (Signed)
Pt assisted to toilet and educated on call-bell in the bathroom. Pt verbalizes understanding.

## 2022-05-29 NOTE — Progress Notes (Signed)
PT Cancellation Note  Patient Details Name: Ronald [name] [facility] MRN: [id number] DOB: 04/16/1946   Cancelled Treatment:    Reason Eval/Treat Not Completed: PT screened, no needs identified, will sign off PT orders received, chart reviewed. Per OT, pt is ambulatory without AD & does not demonstrate acute PT needs. PT to complete current orders at this time.  Lavone Nian, PT, DPT 05/29/22, 3:22 PM   [name] 05/29/2022, 3:21 PM

## 2022-05-30 DIAGNOSIS — I5043 Acute on chronic combined systolic (congestive) and diastolic (congestive) heart failure: Secondary | ICD-10-CM | POA: Diagnosis not present

## 2022-05-30 DIAGNOSIS — I480 Paroxysmal atrial fibrillation: Secondary | ICD-10-CM | POA: Diagnosis not present

## 2022-05-30 DIAGNOSIS — N184 Chronic kidney disease, stage 4 (severe): Secondary | ICD-10-CM | POA: Diagnosis not present

## 2022-05-30 DIAGNOSIS — N189 Chronic kidney disease, unspecified: Secondary | ICD-10-CM | POA: Diagnosis not present

## 2022-05-30 LAB — RENAL FUNCTION PANEL
Albumin: 3.2 g/dL — ABNORMAL LOW (ref 3.5–5.0)
Anion gap: 9 (ref 5–15)
BUN: 65 mg/dL — ABNORMAL HIGH (ref 8–23)
CO2: 24 mmol/L (ref 22–32)
Calcium: 9 mg/dL (ref 8.9–10.3)
Chloride: 113 mmol/L — ABNORMAL HIGH (ref 98–111)
Creatinine, Ser: 4.1 mg/dL — ABNORMAL HIGH (ref 0.61–1.24)
GFR, Estimated: 14 mL/min — ABNORMAL LOW (ref >60)
Glucose, Bld: 97 mg/dL (ref 70–99)
Phosphorus: 4.2 mg/dL (ref 2.5–4.6)
Potassium: 3.8 mmol/L (ref 3.5–5.1)
Sodium: 146 mmol/L — ABNORMAL HIGH (ref 135–145)

## 2022-05-30 LAB — CBC
HCT: 26.7 % — ABNORMAL LOW (ref 39.0–52.0)
Hemoglobin: 8.3 g/dL — ABNORMAL LOW (ref 13.0–17.0)
MCH: 27.9 pg (ref 26.0–34.0)
MCHC: 31.1 g/dL (ref 30.0–36.0)
MCV: 89.6 fL (ref 80.0–100.0)
Platelets: 136 10*3/uL — ABNORMAL LOW (ref 150–400)
RBC: 2.98 MIL/uL — ABNORMAL LOW (ref 4.22–5.81)
RDW: 16.2 % — ABNORMAL HIGH (ref 11.5–15.5)
WBC: 5.8 10*3/uL (ref 4.0–10.5)
nRBC: 0 % (ref 0.0–0.2)

## 2022-05-30 LAB — CBG MONITORING, ED
Glucose-Capillary: 98 mg/dL (ref 70–99)
Glucose-Capillary: 113 mg/dL — ABNORMAL HIGH (ref 70–99)
Glucose-Capillary: 106 mg/dL — ABNORMAL HIGH (ref 70–99)

## 2022-05-30 LAB — FOLATE: Folate: 38 ng/mL (ref >5.9)

## 2022-05-30 LAB — FERRITIN: Ferritin: 87 ng/mL (ref 24–336)

## 2022-05-30 LAB — RETICULOCYTES
Immature Retic Fract: 16.6 % — ABNORMAL HIGH (ref 2.3–15.9)
RBC.: 2.92 MIL/uL — ABNORMAL LOW (ref 4.22–5.81)
Retic Count, Absolute: 44.4 10*3/uL (ref 19.0–186.0)
Retic Ct Pct: 1.5 % (ref 0.4–3.1)

## 2022-05-30 LAB — VITAMIN B12: Vitamin B-12: 1160 pg/mL — ABNORMAL HIGH (ref 180–914)

## 2022-05-30 LAB — GLUCOSE, CAPILLARY: Glucose-Capillary: 100 mg/dL — ABNORMAL HIGH (ref 70–99)

## 2022-05-30 LAB — IRON AND TIBC
Iron: 30 ug/dL — ABNORMAL LOW (ref 45–182)
Saturation Ratios: 11 % — ABNORMAL LOW (ref 17.9–39.5)
TIBC: 269 ug/dL (ref 250–450)
UIBC: 239 ug/dL

## 2022-05-30 LAB — MAGNESIUM: Magnesium: 2.7 mg/dL — ABNORMAL HIGH (ref 1.7–2.4)

## 2022-05-30 LAB — PROCALCITONIN: Procalcitonin: 0.18 ng/mL

## 2022-05-30 MED ORDER — FUROSEMIDE 10 MG/ML IJ SOLN
5.0000 mg/h | INTRAVENOUS | Status: DC
  Administered 2022-05-30: 5 mg/h via INTRAVENOUS
  Filled 2022-05-30 (×2): qty 20

## 2022-05-30 NOTE — ED Notes (Signed)
IV dressing changed. IV patent.

## 2022-05-30 NOTE — Progress Notes (Signed)
PROGRESS NOTE  [name] AOZ:[id number] DOB: 07/12/1945   PCP: Glean Hess, MD  Patient is from: Home.  Lives with his wife.  DOA: 05/28/2022 LOS: 2  Chief complaints Chief Complaint  Patient presents with   Fluid Overloaded     Brief Narrative / Interim history: 76 year old M with PMH of CAD/CABG in 1996 and 2007, combined CHF/ICM, CKD-4, COPD, DM-2, paroxysmal A-fib on Eliquis, PVCs, renal artery stenosis, HTN, HLD, TAA, morbid obesity and chronic back pain presenting with progressive shortness of breath for about 3 weeks, dry cough, orthopnea, PND, edema and about 6 pounds weight gain in 2 days, and admitted for acute on chronic combined CHF.  Of note, wife reported that his Lasix was discontinued 3 days prior due to elevated potassium.  BNP 790.  Troponin negative x 2.  CXR showed diffuse mild interstitial thickening and patchy opacities in the chest suspicious for multifocal infection or asymmetric edema.  Patient has no fever or leukocytosis to suggest infection.  He was started on IV Lasix.  Echocardiogram ordered.  Cardiology consulted and following.  Nephrology consulted as well.  Remains on IV Lasix.  Subjective: Seen and examined earlier this morning.  No major events overnight or this morning.  No further chest pain.  Breathing has improved.  He seems to be lying flat in bed without distress.  Still with BLE edema, right> left.  Per wife, asymmetry is chronic.  Objective: Vitals:   05/30/22 1100 05/30/22 1300 05/30/22 1400 05/30/22 1600  BP: (!) 140/57 (!) 152/57 (!) 148/60 (!) 161/64  Pulse: (!) 55 (!) 55 (!) 57 (!) 56  Resp: '13 17 13 18  '$ Temp: 98 F (36.7 C)   98.2 F (36.8 C)  TempSrc:      SpO2: 96%  96% 97%  Weight:      Height:        Examination:  GENERAL: No apparent distress.  Nontoxic. HEENT: MMM.  Vision grossly intact.  Diminished hearing. NECK: Supple.  Notable JVD. RESP: On RA.  No IWOB.  Fair aeration bilaterally. CVS:  RRR. Heart  sounds normal.  ABD/GI/GU: BS+. Abd soft, NTND.  MSK/EXT:  Moves extremities. No apparent deformity.  1+ RLE edema.  Trace LLE edema. SKIN: no apparent skin lesion or wound NEURO: Sleepy but wakes to voice.  Oriented appropriately.  No apparent focal neuro deficit. PSYCH: Calm. Normal affect.   Procedures:  None  Microbiology summarized: [id number], influenza and RSV PCR nonreactive.  Assessment and plan: Principal Problem:   Acute on chronic combined systolic (congestive) and diastolic (congestive) heart failure (HCC) Active Problems:   Hypertensive heart disease without CHF   Essential hypertension   Dyslipidemia   Diabetes mellitus with nephropathy (HCC)   Paroxysmal atrial fibrillation (HCC)   Coronary artery disease   Hypothyroidism   Renal artery stenosis    History of redo bypass grafting   Anemia of renal disease   Spinal stenosis of lumbar region without neurogenic claudication   Morbid obesity (HCC)   Thoracic aortic aneurysm (TAA) (HCC)   Chronic kidney disease, stage 4 (severe) (HCC)   GERD without esophagitis  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  Wife reports discontinuation of furosemide due to high potassium about 3 days prior.  TTE in 05/2018 with LVEF of 30 to 35%, indeterminate DD.  BNP 790 but no recent values for comparison.  Personally reviewed CXR favors pulmonary edema versus pneumonia.  Pro-Cal only 0.19.  Started on IV Lasix.  About 1.7 L UOP overnight.  Symptoms improved.  However, still with JVD and significant BLE edema.  Creatinine and BP stable. -Cardiology and Nephrology consulted -Initially on IV Lasix 40 mg twice daily -Now on Lasix drip to protect renal function,  -Continue Imdur, metoprolol, atorvastatin -Hold Avapro given his renal function. -Strict intake and output, daily weight, renal functions and electrolytes -Follow cardiology recommendations -PT/OT  eval  History of CAD/CABG in 1996 and 2007.  Initial chest pain likely from CHF.  Serial troponin negative.  Chest pain resolved. -Continue beta-blocker, Imdur, Lasix, statin  Paroxysmal A-fib: Rate controlled. -Continue amiodarone, metoprolol and Eliquis. -Optimize electrolytes.  Essential hypertension: BP within acceptable range. -Cardiac meds as above.   Controlled NIDDM-2 with CKD-4 and HLD: A1c 5.7% in 10/2021.  Does not seem to be on medication.  CBG within acceptable range. Recent Labs  Lab 05/29/22 1131 05/29/22 1702 05/30/22 0805 05/30/22 1132 05/30/22 1605  GLUCAP 133* 117* 98 113* 106*  -Recheck hemoglobin A1c -May discontinue CBG monitoring if CBG stable and A1c stable. -Continue home statin.  CKD-4: Renal function stable. Recent Labs    08/07/21 1536 08/12/21 1351 11/24/21 1625 05/19/22 1548 05/25/22 0843 05/28/22 1504 05/29/22 0404 05/30/22 0433  BUN 45* 38* 58* 50* 56* 66* 67* 65*  CREATININE 3.47* 2.93* 3.66* 4.16* 4.23* 4.10* 4.11* 4.10*  -Continue monitoring -Nephrology consulted. -Diuresis transitioned to Lasix drip today (12/31)  Anemia of renal disease: denies GI bleed, melena or hematochezia.  However, he is on Eliquis. Recent Labs    08/07/21 1536 10/01/21 1440 05/19/22 1548 05/25/22 0831 05/28/22 1504 05/29/22 0404 05/30/22 0433  HGB 12.0* 12.6* 9.9* 9.6* 9.1* 8.3* 8.3*  -Recheck CBC this afternoon -Check anemia panel -May benefit from Epo.  Nephrology consulted.  Hypothyroidism -Continue home Synthroid   GERD without esophagitis -Continue PPI therapy.     Morbid obesity Body mass index is 35.58 kg/m.     DVT prophylaxis:   apixaban (ELIQUIS) tablet 5 mg  Code Status: Full code Family Communication: Updated patient's wife at bedside Level of care: Telemetry Cardiac Status is: Inpatient Remains inpatient appropriate because: Acute on chronic combined CHF requiring IV diuretics   Final disposition: TBD Consultants:   Cardiology Nephrology   Labs & Data Reviewed --- notable results as outlined below:  Cr 4.10 from 4.11 (baseline ~3.47) Na 146, Cl 113, BUN 65, Mg 2.7, Albumin 3.2, GFR 14  Iron 30, sat ratio 11, normal ferritin, normal folate, elevated B12  CBC - Hbg 8.3 stable  Echo -- EF 38-46%, grade 2 diastolic dysfunction, mild-mod TR, mild MR      Dr. Nicole Kindred, DO [facility]  If 7PM-7AM, please contact night-coverage [email or web address] 05/30/2022, 4:34 PM

## 2022-05-30 NOTE — Progress Notes (Signed)
[facility]  ROUNDING NOTE   Subjective:   Ronald [name] is a 76 year old male with past medical conditions including paroxysmal atrial fibrillation on Eliquis, systolic and diastolic CHF, CAD status post CABG/PCI/stents, hypertension, dyslipidemia, type 2 diabetes, and chronic kidney disease stage IV.  Patient presents to the emergency department with progressive shortness of breath.  He has been admitted for Acute on chronic systolic (congestive) heart failure (HCC) [I50.23] Acute on chronic combined systolic (congestive) and diastolic (congestive) heart failure ([place]) [I50.43]  Patient is known to our practice and is followed outpatient by Dr. Candiss Norse.  Patient was last seen in office on 05/04/2022 for routine follow-up.  Patient is seen resting on stretcher, wife at bedside.  Wife states patient has progressively became short of breath over the past 3 weeks.  Patient denies cough.  Reports sleeping in a recliner.  Lower extremity edema has increased as well.  No known fever or chills.  No known sick contacts.  Denies nausea, vomiting, or diarrhea.  Has had recent weight gain.  Labs on ED arrival include glucose 140, BUN 66, creatinine 4.10 with GFR 14 BNP 788, and hemoglobin 9.1.  Respiratory panel negative for influenza, COVID-19, and RSV.  UA appears clear.  Chest x-ray shows diffuse mild interstitial and patchy opacities suspicious for multifocal infection.  Echo completed on 05/29/2022 shows EF 35 to 40% with grade 2 diastolic dysfunction, mild mitral valve regurgitation, mild to moderate tricuspid valve regurgitation, and moderate aortic valve calcification.  Patient started on IV furosemide 40 mg twice daily.  Creatinine has progressively worsened since that time.  We have been consulted to monitor acute kidney injury.   Objective:  Vital signs in last 24 hours:  Temp:  [98 F (36.7 C)-98.3 F (36.8 C)] 98 F (36.7 C) (12/31 1100) Pulse Rate:  [48-62] 55 (12/31 1100) Resp:   [12-20] 13 (12/31 1100) BP: (108-153)/(51-86) 140/57 (12/31 1100) SpO2:  [90 %-99 %] 96 % (12/31 1100)  Weight change:  Filed Weights   05/28/22 1501 05/28/22 1817  Weight: 112.5 kg 112.5 kg    Intake/Output: I/O last 3 completed shifts: In: -  Out: 2350 [Urine:2350]   Intake/Output this shift:  Total I/O In: -  Out: 500 [Urine:500]  Physical Exam: General: NAD  Head: Normocephalic, atraumatic. Moist oral mucosal membranes  Eyes: Anicteric  Lungs:  Clear to auscultation, normal effort, NCO2  Heart: S1-S2 present, no murmurs  Abdomen:  Soft, nontender, nondistended  Extremities: 2+ peripheral edema.  Neurologic: Alert and oriented, moving all four extremities  Skin: No lesions  Access: None    Basic Metabolic Panel: Recent Labs  Lab 05/25/22 0843 05/28/22 1504 05/29/22 0404 05/30/22 0433  NA 139 143 146* 146*  K 5.5* 4.5 4.0 3.8  CL 107 115* 114* 113*  CO2 '25 22 23 24  '$ GLUCOSE 141* 140* 120* 97  BUN 56* 66* 67* 65*  CREATININE 4.23* 4.10* 4.11* 4.10*  CALCIUM 9.3 9.1 9.1 9.0  MG  --   --   --  2.7*  PHOS  --   --   --  4.2    Liver Function Tests: Recent Labs  Lab 05/28/22 1504 05/30/22 0433  AST 46*  --   ALT 43  --   ALKPHOS 126  --   BILITOT 1.4*  --   PROT 6.8  --   ALBUMIN 3.5 3.2*   No results for input(s): "LIPASE", "AMYLASE" in the last 168 hours. No results for input(s): "AMMONIA" in  the last 168 hours.  CBC: Recent Labs  Lab 05/25/22 0831 05/28/22 1504 05/29/22 0404 05/30/22 0433  WBC 8.5 6.5 6.4 5.8  NEUTROABS  --  4.9  --   --   HGB 9.6* 9.1* 8.3* 8.3*  HCT 31.1* 29.4* 26.1* 26.7*  MCV 92.3 91.3 90.0 89.6  PLT 134* 143* 124* 136*    Cardiac Enzymes: No results for input(s): "CKTOTAL", "CKMB", "CKMBINDEX", "TROPONINI" in the last 168 hours.  BNP: Invalid input(s): "POCBNP"  CBG: Recent Labs  Lab 05/29/22 0733 05/29/22 1131 05/29/22 1702 05/30/22 0805 05/30/22 1132  GLUCAP 110* 133* 117* 21 113*     Microbiology: Results for orders placed or performed during the hospital encounter of 05/28/22  Resp panel by RT-PCR (RSV, Flu A&B, Covid) Anterior Nasal Swab     Status: None   Collection Time: 05/28/22  8:19 PM   Specimen: Anterior Nasal Swab  Result Value Ref Range Status   SARS Coronavirus 2 by RT PCR NEGATIVE NEGATIVE Final    Comment: (NOTE) SARS-CoV-2 target nucleic acids are NOT DETECTED.  The SARS-CoV-2 RNA is generally detectable in upper respiratory specimens during the acute phase of infection. The lowest concentration of SARS-CoV-2 viral copies this assay can detect is 138 copies/mL. A negative result does not preclude SARS-Cov-2 infection and should not be used as the sole basis for treatment or other patient management decisions. A negative result may occur with  improper specimen collection/handling, submission of specimen other than nasopharyngeal swab, presence of viral mutation(s) within the areas targeted by this assay, and inadequate number of viral copies(<138 copies/mL). A negative result must be combined with clinical observations, patient history, and epidemiological information. The expected result is Negative.  Fact Sheet for Patients:  EntrepreneurPulse.com.au  Fact Sheet for Healthcare Providers:  IncredibleEmployment.be  This test is no t yet approved or cleared by the [place] FDA and  has been authorized for detection and/or diagnosis of SARS-CoV-2 by FDA under an Emergency Use Authorization (EUA). This EUA will remain  in effect (meaning this test can be used) for the duration of the COVID-19 declaration under Section 564(b)(1) of the Act, 21 U.S.C.section 360bbb-3(b)(1), unless the authorization is terminated  or revoked sooner.       Influenza A by PCR NEGATIVE NEGATIVE Final   Influenza B by PCR NEGATIVE NEGATIVE Final    Comment: (NOTE) The Xpert Xpress SARS-CoV-2/FLU/RSV plus assay is  intended as an aid in the diagnosis of influenza from Nasopharyngeal swab specimens and should not be used as a sole basis for treatment. Nasal washings and aspirates are unacceptable for Xpert Xpress SARS-CoV-2/FLU/RSV testing.  Fact Sheet for Patients: EntrepreneurPulse.com.au  Fact Sheet for Healthcare Providers: IncredibleEmployment.be  This test is not yet approved or cleared by the [place] FDA and has been authorized for detection and/or diagnosis of SARS-CoV-2 by FDA under an Emergency Use Authorization (EUA). This EUA will remain in effect (meaning this test can be used) for the duration of the COVID-19 declaration under Section 564(b)(1) of the Act, 21 U.S.C. section 360bbb-3(b)(1), unless the authorization is terminated or revoked.     Resp Syncytial Virus by PCR NEGATIVE NEGATIVE Final    Comment: (NOTE) Fact Sheet for Patients: EntrepreneurPulse.com.au  Fact Sheet for Healthcare Providers: IncredibleEmployment.be  This test is not yet approved or cleared by the [place] FDA and has been authorized for detection and/or diagnosis of SARS-CoV-2 by FDA under an Emergency Use Authorization (EUA). This EUA will remain in effect (meaning this  test can be used) for the duration of the COVID-19 declaration under Section 564(b)(1) of the Act, 21 U.S.C. section 360bbb-3(b)(1), unless the authorization is terminated or revoked.  Performed at [facility], [place] [place], [place] [address]     Coagulation Studies: No results for input(s): "LABPROT", "INR" in the last 72 hours.  Urinalysis: Recent Labs    05/28/22 1501  COLORURINE YELLOW*  LABSPEC 1.013  PHURINE 6.0  GLUCOSEU NEGATIVE  HGBUR NEGATIVE  BILIRUBINUR NEGATIVE  KETONESUR NEGATIVE  PROTEINUR NEGATIVE  NITRITE NEGATIVE  LEUKOCYTESUR NEGATIVE      Imaging: ECHOCARDIOGRAM COMPLETE  Result Date:  05/29/2022    ECHOCARDIOGRAM REPORT   Patient Name:   Ronald [name] [place] Date of Exam: 05/29/2022 Medical Rec #:  [id number]      Height:       70.0 in Accession #:    [id number]     Weight:       248.0 lb Date of Birth:  07-02-1945      BSA:          2.287 m Patient Age:    84 years       BP:           118/57 mmHg Patient Gender: M              HR:           57 bpm. Exam Location:  [facility] Procedure: 2D Echo, Color Doppler, Cardiac Doppler and Intracardiac            Opacification Agent Indications:     Acute Systolic CHF  History:         Patient has prior history of Echocardiogram examinations. CAD                  and Previous Myocardial Infarction; Risk Factors:Diabetes.  Sonographer:     L. Thornton-Maynard Referring Phys:  [id number] [place] Diagnosing Phys: Ida Rogue MD  Sonographer Comments: Suboptimal apical window. IMPRESSIONS  1. Left ventricular ejection fraction, by estimation, is 35 to 40%. The left ventricle has moderately decreased function. The left ventricle demonstrates global hypokinesis. The left ventricular internal cavity size was mildly dilated. Left ventricular diastolic parameters are consistent with Grade II diastolic dysfunction (pseudonormalization).  2. Right ventricular systolic function is mildly reduced. The right ventricular size is normal. There is normal pulmonary artery systolic pressure. The estimated right ventricular systolic pressure is 56.8 mmHg.  3. The mitral valve is normal in structure. Mild mitral valve regurgitation. No evidence of mitral stenosis.  4. Tricuspid valve regurgitation is mild to moderate.  5. The aortic valve has an indeterminant number of cusps. There is moderate calcification of the aortic valve. Aortic valve regurgitation is not visualized. Aortic valve sclerosis/calcification is present, without any evidence of aortic stenosis.  6. The inferior vena cava is normal in size with greater than 50% respiratory variability, suggesting right atrial pressure  of 3 mmHg. FINDINGS  Left Ventricle: Left ventricular ejection fraction, by estimation, is 35 to 40%. The left ventricle has moderately decreased function. The left ventricle demonstrates global hypokinesis. Definity contrast agent was given IV to delineate the left ventricular endocardial borders. The left ventricular internal cavity size was mildly dilated. There is no left ventricular hypertrophy. Left ventricular diastolic parameters are consistent with Grade II diastolic dysfunction (pseudonormalization). Right Ventricle: The right ventricular size is normal. No increase in right ventricular wall thickness. Right ventricular systolic function is mildly reduced. There is  normal pulmonary artery systolic pressure. The tricuspid regurgitant velocity is 2.45 m/s, and with an assumed right atrial pressure of 3 mmHg, the estimated right ventricular systolic pressure is 76.7 mmHg. Left Atrium: Left atrial size was normal in size. Right Atrium: Right atrial size was normal in size. Pericardium: There is no evidence of pericardial effusion. Mitral Valve: The mitral valve is normal in structure. The E-point septal separation is normal. Mild mitral valve regurgitation. No evidence of mitral valve stenosis. MV peak gradient, 8.4 mmHg. The mean mitral valve gradient is 3.0 mmHg. Tricuspid Valve: The tricuspid valve is normal in structure. Tricuspid valve regurgitation is mild to moderate. No evidence of tricuspid stenosis. Aortic Valve: The aortic valve has an indeterminant number of cusps. There is moderate calcification of the aortic valve. Aortic valve regurgitation is not visualized. Aortic valve sclerosis/calcification is present, without any evidence of aortic stenosis. Aortic valve mean gradient measures 6.0 mmHg. Aortic valve peak gradient measures 10.6 mmHg. Aortic valve area, by VTI measures 2.07 cm. Pulmonic Valve: The pulmonic valve was normal in structure. Pulmonic valve regurgitation is not visualized. No  evidence of pulmonic stenosis. Aorta: The aortic root is normal in size and structure. Venous: The inferior vena cava is normal in size with greater than 50% respiratory variability, suggesting right atrial pressure of 3 mmHg. IAS/Shunts: No atrial level shunt detected by color flow Doppler.  LEFT VENTRICLE PLAX 2D LVIDd:         5.90 cm      Diastology LVIDs:         4.80 cm      LV e' medial:    4.32 cm/s LV PW:         0.80 cm      LV E/e' medial:  29.2 LV IVS:        1.30 cm      LV e' lateral:   9.58 cm/s LVOT diam:     2.20 cm      LV E/e' lateral: 13.2 LV SV:         84 LV SV Index:   37 LVOT Area:     3.80 cm  LV Volumes (MOD) LV vol d, MOD A2C: 204.0 ml LV vol d, MOD A4C: 213.0 ml LV vol s, MOD A2C: 80.5 ml LV vol s, MOD A4C: 86.0 ml LV SV MOD A2C:     123.5 ml LV SV MOD A4C:     213.0 ml LV SV MOD BP:      125.1 ml RIGHT VENTRICLE             IVC RV Basal diam:  4.50 cm     IVC diam: 2.80 cm RV S prime:     10.50 cm/s TAPSE (M-mode): 1.7 cm LEFT ATRIUM             Index        RIGHT ATRIUM           Index LA diam:        3.70 cm 1.62 cm/m   RA Area:     16.20 cm LA Vol (A2C):   85.8 ml 37.51 ml/m  RA Volume:   40.10 ml  17.53 ml/m LA Vol (A4C):   87.7 ml 38.34 ml/m LA Biplane Vol: 88.1 ml 38.52 ml/m  AORTIC VALVE                     PULMONIC VALVE AV Area (Vmax):    2.02  cm      PV Vmax:          1.23 m/s AV Area (Vmean):   1.92 cm      PV Peak grad:     6.0 mmHg AV Area (VTI):     2.07 cm      PR End Diast Vel: 5.95 msec AV Vmax:           163.00 cm/s AV Vmean:          113.000 cm/s AV VTI:            0.404 m AV Peak Grad:      10.6 mmHg AV Mean Grad:      6.0 mmHg LVOT Vmax:         86.45 cm/s LVOT Vmean:        57.100 cm/s LVOT VTI:          0.220 m LVOT/AV VTI ratio: 0.55  AORTA Ao Root diam: 2.70 cm Ao Asc diam:  2.90 cm MITRAL VALVE                TRICUSPID VALVE MV Area (PHT): 5.34 cm     TR Peak grad:   24.0 mmHg MV Area VTI:   1.72 cm     TR Vmax:        245.00 cm/s MV Peak grad:  8.4  mmHg MV Mean grad:  3.0 mmHg     SHUNTS MV Vmax:       1.45 m/s     Systemic VTI:  0.22 m MV Vmean:      80.5 cm/s    Systemic Diam: 2.20 cm MV Decel Time: 142 msec MV E velocity: 126.00 cm/s MV A velocity: 63.90 cm/s MV E/A ratio:  1.97 Ida Rogue MD Electronically signed by Ida Rogue MD Signature Date/Time: 05/29/2022/2:25:16 PM    Final    DG Chest 2 View  Result Date: 05/28/2022 CLINICAL DATA:  Shortness of breath in a 76 year old male. EXAM: CHEST - 2 VIEW COMPARISON:  August 07, 2021 FINDINGS: Post median sternotomy for CABG and LIMA. Cardiomediastinal contours and hilar structures are stable with signs of cardiac enlargement. Diffuse patchy interstitial thickening and patchy opacities throughout the chest greatest in the LEFT mid and lower chest, also present in the RIGHT upper chest, some areas appear vaguely nodular. Biapical pleural and parenchymal scarring. No lobar level consolidative process. No sign of pneumothorax. No pleural effusion. Osteopenia without acute skeletal findings. IMPRESSION: Diffuse mild interstitial thickening and patchy opacities in the chest suspicious for multifocal infection, would also consider asymmetric edema. Given more nodular appearance of LEFT upper lobe and mid chest changes would suggest follow-up PA and lateral chest radiograph outside of the acute setting to ensure resolution. Electronically Signed   By: Zetta Bills M.D.   On: 05/28/2022 15:58     Medications:    furosemide (LASIX) 200 mg in dextrose 5 % 100 mL (2 mg/mL) infusion      allopurinol  100 mg Oral QHS   amiodarone  400 mg Oral Daily   apixaban  5 mg Oral BID   atorvastatin  40 mg Oral QHS   cholecalciferol  2,000 Units Oral Daily   insulin aspart  0-5 Units Subcutaneous QHS   insulin aspart  0-9 Units Subcutaneous TID WC   isosorbide mononitrate  30 mg Oral Daily   levothyroxine  75 mcg Oral QAC breakfast   loratadine  10 mg Oral Daily   magnesium oxide  400 mg Oral BID    metoprolol tartrate  100 mg Oral BID   multivitamin with minerals  1 tablet Oral Daily   pantoprazole  40 mg Oral BID   polyethylene glycol  17 g Oral QHS   sodium bicarbonate  650 mg Oral BID   acetaminophen **OR** acetaminophen, nitroGLYCERIN, ondansetron **OR** ondansetron (ZOFRAN) IV, traZODone  Assessment/ Plan:  Ronald [name] is a 76 y.o.  male with past medical conditions including paroxysmal atrial fibrillation on Eliquis, systolic and diastolic CHF, CAD status post CABG/PCI/stents, hypertension, dyslipidemia, type 2 diabetes, and chronic kidney disease stage IV.  Patient presents to the emergency department with progressive shortness of breath.  He has been admitted for Acute on chronic systolic (congestive) heart failure (HCC) [I50.23] Acute on chronic combined systolic (congestive) and diastolic (congestive) heart failure (HCC) [I50.43]   Acute Kidney Injury on chronic kidney disease stage 4 with baseline creatinine 3.47 and GFR of 17 on 02/09/22.  Acute kidney injury likely secondary to cardiorenal syndrome with fluid overload. Chronic kidney disease is secondary to hypertension and arthrosclerosis.  No IV contrast exposure.  Currently receiving IV furosemide twice daily.  Will transition this to furosemide drip.  No acute need for dialysis at this time however discussed with patient the possibility of temporary dialysis if of creatinine continues to rise.  Will monitor closely  Lab Results  Component Value Date   CREATININE 4.10 (H) 05/30/2022   CREATININE 4.11 (H) 05/29/2022   CREATININE 4.10 (H) 05/28/2022    Intake/Output Summary (Last 24 hours) at 05/30/2022 1248 Last data filed at 05/30/2022 0800 Gross per 24 hour  Intake --  Output 1200 ml  Net -1200 ml   2.  Acute on chronic combined systolic and diastolic heart failure.  Echo completed on 05/29/2022 shows EF 35 to 40% with grade 2 diastolic dysfunction, mild MVR, mild to moderate TVR, and aortic valve  calcification.  Cardiology currently following and recommend continue diuresis.  Will transition patient to IV furosemide drip 5 mg/h and monitor progress.  3. Anemia of chronic kidney disease Lab Results  Component Value Date   HGB 8.3 (L) 05/30/2022    Hemoglobin below desired goal.  Will continue to monitor for now.  4. Secondary Hyperparathyroidism: with outpatient labs: PTH 161, phosphorus 4.1, calcium 8.9 on 04/28/22.   Lab Results  Component Value Date   CALCIUM 9.0 05/30/2022   PHOS 4.2 05/30/2022  Calcium and phosphorus within desired range.  5.  Hypertension with chronic kidney disease.  Home regimen includes amiodarone, amlodipine, furosemide, irbesartan, isosorbide, and metoprolol.  Currently prescribed amiodarone, furosemide drip, isosorbide, and metoprolol.   LOS: 2   12/31/202312:48 PM

## 2022-05-30 NOTE — ED Notes (Signed)
Assumed care from [place], [place]. Pt resting comfortably in bed at this time. Pt denies any current needs or questions. Call light with in reach.

## 2022-05-30 NOTE — ED Notes (Signed)
Advised nurse that patient has ready bed 

## 2022-05-31 DIAGNOSIS — I5043 Acute on chronic combined systolic (congestive) and diastolic (congestive) heart failure: Secondary | ICD-10-CM | POA: Diagnosis not present

## 2022-05-31 LAB — BASIC METABOLIC PANEL
Anion gap: 11 (ref 5–15)
BUN: 63 mg/dL — ABNORMAL HIGH (ref 8–23)
CO2: 26 mmol/L (ref 22–32)
Calcium: 9.3 mg/dL (ref 8.9–10.3)
Chloride: 108 mmol/L (ref 98–111)
Creatinine, Ser: 4.21 mg/dL — ABNORMAL HIGH (ref 0.61–1.24)
GFR, Estimated: 14 mL/min — ABNORMAL LOW (ref >60)
Glucose, Bld: 104 mg/dL — ABNORMAL HIGH (ref 70–99)
Potassium: 3.9 mmol/L (ref 3.5–5.1)
Sodium: 145 mmol/L (ref 135–145)

## 2022-05-31 LAB — CBC
HCT: 29.2 % — ABNORMAL LOW (ref 39.0–52.0)
Hemoglobin: 9.3 g/dL — ABNORMAL LOW (ref 13.0–17.0)
MCH: 27.8 pg (ref 26.0–34.0)
MCHC: 31.8 g/dL (ref 30.0–36.0)
MCV: 87.4 fL (ref 80.0–100.0)
Platelets: 155 10*3/uL (ref 150–400)
RBC: 3.34 MIL/uL — ABNORMAL LOW (ref 4.22–5.81)
RDW: 15.9 % — ABNORMAL HIGH (ref 11.5–15.5)
WBC: 6.2 10*3/uL (ref 4.0–10.5)
nRBC: 0 % (ref 0.0–0.2)

## 2022-05-31 LAB — GLUCOSE, CAPILLARY
Glucose-Capillary: 96 mg/dL (ref 70–99)
Glucose-Capillary: 120 mg/dL — ABNORMAL HIGH (ref 70–99)
Glucose-Capillary: 113 mg/dL — ABNORMAL HIGH (ref 70–99)

## 2022-05-31 LAB — PROCALCITONIN: Procalcitonin: 0.16 ng/mL

## 2022-05-31 MED ORDER — FUROSEMIDE 80 MG PO TABS: 80.0000 mg | ORAL_TABLET | Freq: Every day | ORAL | 1 refills | Status: DC

## 2022-05-31 MED ORDER — FUROSEMIDE 40 MG PO TABS: 40.0000 mg | ORAL_TABLET | Freq: Every evening | ORAL | 1 refills | Status: DC

## 2022-05-31 MED ORDER — FUROSEMIDE 40 MG PO TABS
40.0000 mg | ORAL_TABLET | Freq: Once | ORAL | Status: AC
  Administered 2022-05-31: 40 mg via ORAL
  Filled 2022-05-31: qty 1

## 2022-05-31 NOTE — Care Management Important Message (Signed)
Important Message  Patient Details  Name: Ronald [name] [facility] MRN: [id number] Date of Birth: 1945/11/09   Medicare Important Message Given:  Yes  Reviewed Medicare IM with patient via room phone [phone]).  Copy of Medicare IM sent securely to patient's attention via [place].   [name] 05/31/2022, 2:45 PM

## 2022-05-31 NOTE — Progress Notes (Signed)
[facility]  ROUNDING NOTE   Subjective:   Ronald [name] is a 77 year old male with past medical conditions including paroxysmal atrial fibrillation on Eliquis, systolic and diastolic CHF, CAD status post CABG/PCI/stents, hypertension, dyslipidemia, type 2 diabetes, and chronic kidney disease stage IV.  Patient presents to the emergency department with progressive shortness of breath.  He has been admitted for Acute pulmonary edema (HCC) [J81.0] Acute on chronic combined systolic (congestive) and diastolic (congestive) heart failure (HCC) [I50.43] Acute on chronic systolic (congestive) heart failure (HCC) [I50.23] Acute on chronic congestive heart failure, unspecified heart failure type [facility]) [I50.9]  Patient is known to our practice and is followed outpatient by Dr. Candiss Norse.  Patient was last seen in office on 05/04/2022 for routine follow-up.    Patient seen laying in bed, alert and oriented Remains on room air Appetite appropriate Lower extremity edema slightly improved Patient requesting discharge.  Objective:  Vital signs in last 24 hours:  Temp:  [98 F (36.7 C)-98.8 F (37.1 C)] 98.8 F (37.1 C) (01/01 0857) Pulse Rate:  [55-101] 95 (01/01 0857) Resp:  [13-21] 17 (01/01 0857) BP: (125-161)/(53-79) 144/78 (01/01 0857) SpO2:  [91 %-99 %] 91 % (01/01 0857) Weight:  [105.2 kg] 105.2 kg (12/31 2114)  Weight change:  Filed Weights   05/28/22 1501 05/28/22 1817 05/30/22 2114  Weight: 112.5 kg 112.5 kg 105.2 kg    Intake/Output: I/O last 3 completed shifts: In: 20.2 [I.V.:20.2] Out: 3500 [Urine:3500]   Intake/Output this shift:  Total I/O In: -  Out: 400 [Urine:400]  Physical Exam: General: NAD  Head: Normocephalic, atraumatic. Moist oral mucosal membranes  Eyes: Anicteric  Lungs:  Clear to auscultation, normal effort, NCO2  Heart: S1-S2 present, no murmurs  Abdomen:  Soft, nontender, nondistended  Extremities: 2+ peripheral edema.  Neurologic: Alert  and oriented, moving all four extremities  Skin: No lesions  Access: None    Basic Metabolic Panel: Recent Labs  Lab 05/25/22 0843 05/28/22 1504 05/29/22 0404 05/30/22 0433 05/31/22 0537  NA 139 143 146* 146* 145  K 5.5* 4.5 4.0 3.8 3.9  CL 107 115* 114* 113* 108  CO2 '25 22 23 24 26  '$ GLUCOSE 141* 140* 120* 97 104*  BUN 56* 66* 67* 65* 63*  CREATININE 4.23* 4.10* 4.11* 4.10* 4.21*  CALCIUM 9.3 9.1 9.1 9.0 9.3  MG  --   --   --  2.7*  --   PHOS  --   --   --  4.2  --      Liver Function Tests: Recent Labs  Lab 05/28/22 1504 05/30/22 0433  AST 46*  --   ALT 43  --   ALKPHOS 126  --   BILITOT 1.4*  --   PROT 6.8  --   ALBUMIN 3.5 3.2*    No results for input(s): "LIPASE", "AMYLASE" in the last 168 hours. No results for input(s): "AMMONIA" in the last 168 hours.  CBC: Recent Labs  Lab 05/25/22 0831 05/28/22 1504 05/29/22 0404 05/30/22 0433 05/31/22 0537  WBC 8.5 6.5 6.4 5.8 6.2  NEUTROABS  --  4.9  --   --   --   HGB 9.6* 9.1* 8.3* 8.3* 9.3*  HCT 31.1* 29.4* 26.1* 26.7* 29.2*  MCV 92.3 91.3 90.0 89.6 87.4  PLT 134* 143* 124* 136* 155     Cardiac Enzymes: No results for input(s): "CKTOTAL", "CKMB", "CKMBINDEX", "TROPONINI" in the last 168 hours.  BNP: Invalid input(s): "POCBNP"  CBG: Recent Labs  Lab  05/30/22 0805 05/30/22 1132 05/30/22 1605 05/30/22 2116 05/31/22 0841  GLUCAP 98 113* 106* 100* 96     Microbiology: Results for orders placed or performed during the hospital encounter of 05/28/22  Resp panel by RT-PCR (RSV, Flu A&B, Covid) Anterior Nasal Swab     Status: None   Collection Time: 05/28/22  8:19 PM   Specimen: Anterior Nasal Swab  Result Value Ref Range Status   SARS Coronavirus 2 by RT PCR NEGATIVE NEGATIVE Final    Comment: (NOTE) SARS-CoV-2 target nucleic acids are NOT DETECTED.  The SARS-CoV-2 RNA is generally detectable in upper respiratory specimens during the acute phase of infection. The lowest concentration of  SARS-CoV-2 viral copies this assay can detect is 138 copies/mL. A negative result does not preclude SARS-Cov-2 infection and should not be used as the sole basis for treatment or other patient management decisions. A negative result may occur with  improper specimen collection/handling, submission of specimen other than nasopharyngeal swab, presence of viral mutation(s) within the areas targeted by this assay, and inadequate number of viral copies(<138 copies/mL). A negative result must be combined with clinical observations, patient history, and epidemiological information. The expected result is Negative.  Fact Sheet for Patients:  EntrepreneurPulse.com.au  Fact Sheet for Healthcare Providers:  IncredibleEmployment.be  This test is no t yet approved or cleared by the [place] FDA and  has been authorized for detection and/or diagnosis of SARS-CoV-2 by FDA under an Emergency Use Authorization (EUA). This EUA will remain  in effect (meaning this test can be used) for the duration of the COVID-19 declaration under Section 564(b)(1) of the Act, 21 U.S.C.section 360bbb-3(b)(1), unless the authorization is terminated  or revoked sooner.       Influenza A by PCR NEGATIVE NEGATIVE Final   Influenza B by PCR NEGATIVE NEGATIVE Final    Comment: (NOTE) The Xpert Xpress SARS-CoV-2/FLU/RSV plus assay is intended as an aid in the diagnosis of influenza from Nasopharyngeal swab specimens and should not be used as a sole basis for treatment. Nasal washings and aspirates are unacceptable for Xpert Xpress SARS-CoV-2/FLU/RSV testing.  Fact Sheet for Patients: EntrepreneurPulse.com.au  Fact Sheet for Healthcare Providers: IncredibleEmployment.be  This test is not yet approved or cleared by the [place] FDA and has been authorized for detection and/or diagnosis of SARS-CoV-2 by FDA under an Emergency Use  Authorization (EUA). This EUA will remain in effect (meaning this test can be used) for the duration of the COVID-19 declaration under Section 564(b)(1) of the Act, 21 U.S.C. section 360bbb-3(b)(1), unless the authorization is terminated or revoked.     Resp Syncytial Virus by PCR NEGATIVE NEGATIVE Final    Comment: (NOTE) Fact Sheet for Patients: EntrepreneurPulse.com.au  Fact Sheet for Healthcare Providers: IncredibleEmployment.be  This test is not yet approved or cleared by the [place] FDA and has been authorized for detection and/or diagnosis of SARS-CoV-2 by FDA under an Emergency Use Authorization (EUA). This EUA will remain in effect (meaning this test can be used) for the duration of the COVID-19 declaration under Section 564(b)(1) of the Act, 21 U.S.C. section 360bbb-3(b)(1), unless the authorization is terminated or revoked.  Performed at [facility], [address] [place], [place] [address]     Coagulation Studies: No results for input(s): "LABPROT", "INR" in the last 72 hours.  Urinalysis: Recent Labs    05/28/22 1501  COLORURINE YELLOW*  LABSPEC 1.013  PHURINE 6.0  GLUCOSEU NEGATIVE  HGBUR NEGATIVE  BILIRUBINUR NEGATIVE  KETONESUR NEGATIVE  PROTEINUR NEGATIVE  NITRITE NEGATIVE  LEUKOCYTESUR NEGATIVE       Imaging: ECHOCARDIOGRAM COMPLETE  Result Date: 05/29/2022    ECHOCARDIOGRAM REPORT   Patient Name:   Ronald [name] Date of Exam: 05/29/2022 Medical Rec #:  [id number]      Height:       70.0 in Accession #:    [id number]     Weight:       248.0 lb Date of Birth:  [birth year]      BSA:          2.287 m Patient Age:    16 years       BP:           118/57 mmHg Patient Gender: M              HR:           57 bpm. Exam Location:  [facility] Procedure: 2D Echo, Color Doppler, Cardiac Doppler and Intracardiac            Opacification Agent Indications:     Acute Systolic CHF  History:         Patient has prior  history of Echocardiogram examinations. CAD                  and Previous Myocardial Infarction; Risk Factors:Diabetes.  Sonographer:     L. Thornton-Maynard Referring Phys:  [id number] [place] Diagnosing Phys: Ida Rogue MD  Sonographer Comments: Suboptimal apical window. IMPRESSIONS  1. Left ventricular ejection fraction, by estimation, is 35 to 40%. The left ventricle has moderately decreased function. The left ventricle demonstrates global hypokinesis. The left ventricular internal cavity size was mildly dilated. Left ventricular diastolic parameters are consistent with Grade II diastolic dysfunction (pseudonormalization).  2. Right ventricular systolic function is mildly reduced. The right ventricular size is normal. There is normal pulmonary artery systolic pressure. The estimated right ventricular systolic pressure is 34.1 mmHg.  3. The mitral valve is normal in structure. Mild mitral valve regurgitation. No evidence of mitral stenosis.  4. Tricuspid valve regurgitation is mild to moderate.  5. The aortic valve has an indeterminant number of cusps. There is moderate calcification of the aortic valve. Aortic valve regurgitation is not visualized. Aortic valve sclerosis/calcification is present, without any evidence of aortic stenosis.  6. The inferior vena cava is normal in size with greater than 50% respiratory variability, suggesting right atrial pressure of 3 mmHg. FINDINGS  Left Ventricle: Left ventricular ejection fraction, by estimation, is 35 to 40%. The left ventricle has moderately decreased function. The left ventricle demonstrates global hypokinesis. Definity contrast agent was given IV to delineate the left ventricular endocardial borders. The left ventricular internal cavity size was mildly dilated. There is no left ventricular hypertrophy. Left ventricular diastolic parameters are consistent with Grade II diastolic dysfunction (pseudonormalization). Right Ventricle: The right ventricular  size is normal. No increase in right ventricular wall thickness. Right ventricular systolic function is mildly reduced. There is normal pulmonary artery systolic pressure. The tricuspid regurgitant velocity is 2.45 m/s, and with an assumed right atrial pressure of 3 mmHg, the estimated right ventricular systolic pressure is 93.7 mmHg. Left Atrium: Left atrial size was normal in size. Right Atrium: Right atrial size was normal in size. Pericardium: There is no evidence of pericardial effusion. Mitral Valve: The mitral valve is normal in structure. The E-point septal separation is normal. Mild mitral valve regurgitation. No evidence of mitral valve stenosis. MV peak gradient, 8.4 mmHg. The  mean mitral valve gradient is 3.0 mmHg. Tricuspid Valve: The tricuspid valve is normal in structure. Tricuspid valve regurgitation is mild to moderate. No evidence of tricuspid stenosis. Aortic Valve: The aortic valve has an indeterminant number of cusps. There is moderate calcification of the aortic valve. Aortic valve regurgitation is not visualized. Aortic valve sclerosis/calcification is present, without any evidence of aortic stenosis. Aortic valve mean gradient measures 6.0 mmHg. Aortic valve peak gradient measures 10.6 mmHg. Aortic valve area, by VTI measures 2.07 cm. Pulmonic Valve: The pulmonic valve was normal in structure. Pulmonic valve regurgitation is not visualized. No evidence of pulmonic stenosis. Aorta: The aortic root is normal in size and structure. Venous: The inferior vena cava is normal in size with greater than 50% respiratory variability, suggesting right atrial pressure of 3 mmHg. IAS/Shunts: No atrial level shunt detected by color flow Doppler.  LEFT VENTRICLE PLAX 2D LVIDd:         5.90 cm      Diastology LVIDs:         4.80 cm      LV e' medial:    4.32 cm/s LV PW:         0.80 cm      LV E/e' medial:  29.2 LV IVS:        1.30 cm      LV e' lateral:   9.58 cm/s LVOT diam:     2.20 cm      LV E/e'  lateral: 13.2 LV SV:         84 LV SV Index:   37 LVOT Area:     3.80 cm  LV Volumes (MOD) LV vol d, MOD A2C: 204.0 ml LV vol d, MOD A4C: 213.0 ml LV vol s, MOD A2C: 80.5 ml LV vol s, MOD A4C: 86.0 ml LV SV MOD A2C:     123.5 ml LV SV MOD A4C:     213.0 ml LV SV MOD BP:      125.1 ml RIGHT VENTRICLE             IVC RV Basal diam:  4.50 cm     IVC diam: 2.80 cm RV S prime:     10.50 cm/s TAPSE (M-mode): 1.7 cm LEFT ATRIUM             Index        RIGHT ATRIUM           Index LA diam:        3.70 cm 1.62 cm/m   RA Area:     16.20 cm LA Vol (A2C):   85.8 ml 37.51 ml/m  RA Volume:   40.10 ml  17.53 ml/m LA Vol (A4C):   87.7 ml 38.34 ml/m LA Biplane Vol: 88.1 ml 38.52 ml/m  AORTIC VALVE                     PULMONIC VALVE AV Area (Vmax):    2.02 cm      PV Vmax:          1.23 m/s AV Area (Vmean):   1.92 cm      PV Peak grad:     6.0 mmHg AV Area (VTI):     2.07 cm      PR End Diast Vel: 5.95 msec AV Vmax:           163.00 cm/s AV Vmean:          113.000 cm/s AV  VTI:            0.404 m AV Peak Grad:      10.6 mmHg AV Mean Grad:      6.0 mmHg LVOT Vmax:         86.45 cm/s LVOT Vmean:        57.100 cm/s LVOT VTI:          0.220 m LVOT/AV VTI ratio: 0.55  AORTA Ao Root diam: 2.70 cm Ao Asc diam:  2.90 cm MITRAL VALVE                TRICUSPID VALVE MV Area (PHT): 5.34 cm     TR Peak grad:   24.0 mmHg MV Area VTI:   1.72 cm     TR Vmax:        245.00 cm/s MV Peak grad:  8.4 mmHg MV Mean grad:  3.0 mmHg     SHUNTS MV Vmax:       1.45 m/s     Systemic VTI:  0.22 m MV Vmean:      80.5 cm/s    Systemic Diam: 2.20 cm MV Decel Time: 142 msec MV E velocity: 126.00 cm/s MV A velocity: 63.90 cm/s MV E/A ratio:  1.97 Ida Rogue MD Electronically signed by Ida Rogue MD Signature Date/Time: 05/29/2022/2:25:16 PM    Final      Medications:    furosemide (LASIX) 200 mg in dextrose 5 % 100 mL (2 mg/mL) infusion 5 mg/hr (05/30/22 2200)    allopurinol  100 mg Oral QHS   amiodarone  400 mg Oral Daily   apixaban  5  mg Oral BID   atorvastatin  40 mg Oral QHS   cholecalciferol  2,000 Units Oral Daily   insulin aspart  0-5 Units Subcutaneous QHS   insulin aspart  0-9 Units Subcutaneous TID WC   isosorbide mononitrate  30 mg Oral Daily   levothyroxine  75 mcg Oral QAC breakfast   loratadine  10 mg Oral Daily   magnesium oxide  400 mg Oral BID   metoprolol tartrate  100 mg Oral BID   multivitamin with minerals  1 tablet Oral Daily   pantoprazole  40 mg Oral BID   polyethylene glycol  17 g Oral QHS   sodium bicarbonate  650 mg Oral BID   acetaminophen **OR** acetaminophen, nitroGLYCERIN, ondansetron **OR** ondansetron (ZOFRAN) IV, traZODone  Assessment/ Plan:  [name] is a 77 y.o.  male with past medical conditions including paroxysmal atrial fibrillation on Eliquis, systolic and diastolic CHF, CAD status post CABG/PCI/stents, hypertension, dyslipidemia, type 2 diabetes, and chronic kidney disease stage IV.  Patient presents to the emergency department with progressive shortness of breath.  He has been admitted for Acute pulmonary edema (HCC) [J81.0] Acute on chronic combined systolic (congestive) and diastolic (congestive) heart failure (HCC) [I50.43] Acute on chronic systolic (congestive) heart failure (HCC) [I50.23] Acute on chronic congestive heart failure, unspecified heart failure type ([place]) [I50.9]   Acute Kidney Injury on chronic kidney disease stage 4 with baseline creatinine 3.47 and GFR of 17 on 02/09/22.  Acute kidney injury likely secondary to cardiorenal syndrome with fluid overload. Chronic kidney disease is secondary to hypertension and arthrosclerosis.  No IV contrast exposure.    Creatinine slightly elevated, not unexpected with transition to furosemide drip yesterday.  Adequate urine output recorded, 2.8 L.  Slight improvement with lower extremity edema.  Will defer discharge plan to primary team.  Would recommend increasing home  regimen of diuretics to oral furosemide 80 mg in  the a.m. and furosemide 40 mg at p.m.  Patient will need close outpatient follow-up in our office at discharge.  Lab Results  Component Value Date   CREATININE 4.21 (H) 05/31/2022   CREATININE 4.10 (H) 05/30/2022   CREATININE 4.11 (H) 05/29/2022    Intake/Output Summary (Last 24 hours) at 05/31/2022 1023 Last data filed at 05/31/2022 0858 Gross per 24 hour  Intake 20.16 ml  Output 2700 ml  Net -2679.84 ml    2.  Acute on chronic combined systolic and diastolic heart failure.  Echo completed on 05/29/2022 shows EF 35 to 40% with grade 2 diastolic dysfunction, mild MVR, mild to moderate TVR, and aortic valve calcification.  Cardiology currently following.  3. Anemia of chronic kidney disease Lab Results  Component Value Date   HGB 9.3 (L) 05/31/2022    Hemoglobin within acceptable target.  Will continue to monitor for now.  4. Secondary Hyperparathyroidism: with outpatient labs: PTH 161, phosphorus 4.1, calcium 8.9 on 04/28/22.   Lab Results  Component Value Date   CALCIUM 9.3 05/31/2022   PHOS 4.2 05/30/2022  Bone minerals acceptable.  Will continue to monitor.  5.  Hypertension with chronic kidney disease.  Home regimen includes amiodarone, amlodipine, furosemide, irbesartan, isosorbide, and metoprolol.  Currently prescribed amiodarone, furosemide drip, isosorbide, and metoprolol.  Blood pressure 144/78.   LOS: 3   1/1/202410:23 AM

## 2022-05-31 NOTE — Consult Note (Signed)
   Heart Failure Nurse Navigator Note  HFrEF 35 to 40%.  Left ventricular internal cavity is mildly dilated.  Grade 2 diastolic dysfunction.  Right ventricular systolic function is mildly reduced.  Mild mitral regurgitation.  Moderate tricuspid regurgitation.  He presented to the emergency room with complaints of 3-week history of worsening shortness of breath, dry cough, lower extremity edema and orthopnea.  Comorbidities:  Coronary artery disease/CABG/stenting COPD Atrial flutter Chronic kidney disease stage IV Hypertension Hyperlipidemia  Medications:  Amiodarone 400 mg daily Apixaban 5 mg 2 times a day Atorvastatin 40 mg daily Lasix infusion at 5 mg an hour IV Isosorbide mononitrate 30 mg daily Levothyroxine 75 mcg daily Metoprolol tartrate 100 mg 2 times a day   Labs:  Sodium 145, potassium 3.9, chloride 108, CO2 26, BUN 63, creatinine 4.21. Weight is 105.2 Intake 20 mL Output 2800 mL  Initial meeting with patient who was lying in bed in no acute distress.  His wife [name] was present at the bedside.  Discussed heart failure and what it means.  Patient states that the doctors have not really discussed the function of his heart with him.  Made aware of the 2 types of heart failure 1 with preserved EF and the other with reduced EF.  He voices understanding.  Went over daily weights and reporting 2 pound weight gain overnight or 5 pounds total within the week.  Also discussed changes in symptoms to report, increasing shortness of breath, PND, orthopnea, lower extremity edema and increasing abdominal girth etc.  Also discussed 2000 mg sodium restriction.  Wife states that they do eat quite a bit of canned soups, stressed the importance of no more than 2000 mg of sodium daily.  And other types of foods to avoid.  Also discussed restaurant eating.  Wife states that she does not cook with salt nor does he added at the table.  Made aware of the 64 ounce fluid restriction and  what constitutes of fluid.  Also discussed with such as watermelon that need to be added to the fluid restriction.  He voices understanding.  He was made aware of his appointment in the outpatient heart failure clinic for which she has an appointment on January 16 at 1:30 in the afternoon.  He has a 2% no-show ratio which is 2 out of 129 appointments.  He was given the living with heart failure teaching booklet, zone magnet, info on heart failure and low-sodium along with weight chart.  They had no further questions.  Pricilla Riffle RN [facility]

## 2022-05-31 NOTE — Discharge Summary (Signed)
Physician Discharge Summary   Patient: Ronald [name] [facility] MRN: [id number] DOB: [birth year]  Admit date:     05/28/2022  Discharge date: 05/31/22  Discharge Physician: Ezekiel Slocumb   PCP: Glean Hess, MD   Recommendations at discharge:    Follow up with Nephrology Follow up with Cardiology Follow up with Primary Care Repeat BMP, CBC, Mg in 1-2 weeks  Discharge Diagnoses: Principal Problem:   Acute on chronic combined systolic (congestive) and diastolic (congestive) heart failure (HCC) Active Problems:   Hypertensive heart disease without CHF   Essential hypertension   Dyslipidemia   Diabetes mellitus with nephropathy (HCC)   Paroxysmal atrial fibrillation ([name])   Coronary artery disease   Hypothyroidism   Renal artery stenosis    History of redo bypass grafting   Anemia of renal disease   Spinal stenosis of lumbar region without neurogenic claudication   Morbid obesity (Ronald [name])   Thoracic aortic aneurysm (TAA) (HCC)   Chronic kidney disease, stage 4 (severe) (Ronald [name])   GERD without esophagitis  Resolved Problems:   * No resolved hospital problems. *  Hospital Course: 77 year old M with PMH of CAD/CABG in 1996 and 2007, combined CHF/ICM, CKD-4, COPD, DM-2, paroxysmal A-fib on Eliquis, PVCs, renal artery stenosis, HTN, HLD, TAA, morbid obesity and chronic back pain presenting with progressive shortness of breath for about 3 weeks, dry cough, orthopnea, PND, edema and about 6 pounds weight gain in 2 days, and admitted for acute on chronic combined CHF. Of note, wife reported that his Lasix was discontinued 3 days prior due to elevated potassium. BNP 790. Troponin negative x 2. CXR showed diffuse mild interstitial thickening and patchy opacities in the chest suspicious for multifocal infection or asymmetric edema. Patient has no fever or leukocytosis to suggest infection. He was started on IV Lasix. Echocardiogram ordered. Cardiology consulted and following. Nephrology consulted as  well.   Initially treated with IV Lasix push twice daily.  To preserve renal function, was transitioned to  Lasix drip by Nephrology yesterday. Net IO Since Admission: -6,329.84 mL [05/31/22 1916] Diuresed very well.  Peripheral edema nearly resolved.  Dyspnea resolved.   Nephrology recommends oral Lasix 80 mg qAM, 40 mg qPM. Recommend close outpatient follow up for monitoring of renal function.  Cr slightly improved from time of admission and urine outpatient remains adequate.    Stable for discharge home this afternoon.    Assessment and Plan:  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  Wife reports discontinuation of furosemide due to high potassium about 3 days prior.  TTE in 79/2020 with LVEF of 30 to 35%, indeterminate DD.  BNP 790 but no recent values for comparison.  Personally reviewed CXR favors pulmonary edema versus pneumonia.  Pro-Cal only 0.19.  Started on IV Lasix.  About 1.7 L UOP overnight.  Symptoms improved.  However, still with JVD and significant BLE edema.  Creatinine and BP stable. -Cardiology and Nephrology consulted -Initially on IV Lasix 40 mg twice daily -12/31 -- transitioned Lasix drip to protect renal function,  -Discharge on PO Lasix 80 mg qAM, 40 mg qPM - per nephrology -Continue Imdur, metoprolol, atorvastatin -Hold Avapro given his renal function - consider resume at follow up   History of CAD/CABG in 1996 and 2007.  Initial chest pain likely from CHF.  Serial troponin negative.  Chest pain resolved. -Continue beta-blocker, Imdur, Lasix, statin   Paroxysmal A-fib: Rate controlled. -Continue amiodarone, metoprolol and Eliquis. -  Optimize electrolytes.   Essential hypertension: BP within acceptable range. -Cardiac meds as above.   Controlled NIDDM-2 with CKD-4 and HLD: A1c 5.7% in 10/2021.  Does not seem to be on medication.   -Continue home statin.   CKD-4: Renal function slightly  worse than baseline. -Continue monitoring -Nephrology consulted. -Diuresis transitioned to Lasix drip today (12/31) -Close Nephrology follow u   Anemia of renal disease: denies GI bleed, melena or hematochezia.  Is on Eliquis. -Monitor CBC at follow up -May benefit from Epo.  Nephrology consulted.   Hypothyroidism -Continue home Synthroid   GERD without esophagitis -Continue PPI therapy.       Morbid obesity Body mass index is 35.58 kg/m.       Consultants: Nephrology, Cardiology Procedures performed: Echo  Disposition: Home Diet recommendation:  Discharge Diet Orders (From admission, onward)     Start     Ordered   05/31/22 0000  Diet - low sodium heart healthy        05/31/22 1614           Cardiac and Carb modified diet DISCHARGE MEDICATION: Allergies as of 05/31/2022       Reactions   Predicort [prednisolone] Other (See Comments)   Stomach pain   Ciprofloxacin Other (See Comments)   GI upset   Hydrochlorothiazide Other (See Comments)   Dehydration   Hydrocodone Nausea Only, Other (See Comments)   Stomach upset   Hydrocodone-acetaminophen Nausea Only   Stomach upset   Sulfa Antibiotics Other (See Comments)   Cannot recall   Hydrocodone-acetaminophen Nausea Only, Other (See Comments)   Stomach upset   Penicillins Hives, Rash, Other (See Comments)   Has patient had a PCN reaction causing immediate rash, facial/tongue/throat swelling, SOB or lightheadedness with hypotension: YES Has patient had a PCN reaction causing severe rash involving mucus membranes or skin necrosis: NO Has patient had a PCN reaction that required hospitalization NO Has patient had a PCN reaction occurring within the last 10 years:NO If all of the above answers are "NO", then may proceed with Cephalosporin use. Has patient had a PCN reaction causing immediate rash, facial/tongue/throat swelling, SOB or lightheadedness with hypotension: YES Has patient had a PCN reaction causing  severe rash involving mucus membranes or skin necrosis: NO Has patient had a PCN reaction that required hospitalization NO Has patient had a PCN reaction occurring within the last 10 years:NO If all of the above answers are "NO", then may proceed with Cephalosporin use.        Medication List     STOP taking these medications    irbesartan 150 MG tablet Commonly known as: Avapro       TAKE these medications    acetaminophen 650 MG CR tablet Commonly known as: TYLENOL Take 1,300 mg by mouth at bedtime.   allopurinol 100 MG tablet Commonly known as: ZYLOPRIM TAKE 1 TABLET BY MOUTH EVERYDAY AT BEDTIME   amiodarone 200 MG tablet Commonly known as: PACERONE Take 400 mg by mouth daily.   amLODipine 10 MG tablet Commonly known as: NORVASC Take 5 mg by mouth daily.   apixaban 5 MG Tabs tablet Commonly known as: ELIQUIS Take 1 tablet (5 mg total) by mouth 2 (two) times daily.   atorvastatin 40 MG tablet Commonly known as: LIPITOR Take 1 tablet (40 mg total) by mouth daily. What changed: when to take this   furosemide 40 MG tablet Commonly known as: Lasix Take 1 tablet (40 mg total) by mouth every evening. What  changed: when to take this   furosemide 80 MG tablet Commonly known as: Lasix Take 1 tablet (80 mg total) by mouth daily. What changed: You were already taking a medication with the same name, and this prescription was added. Make sure you understand how and when to take each.   isosorbide mononitrate 30 MG 24 hr tablet Commonly known as: IMDUR TAKE 1 TABLET BY MOUTH DAILY   levothyroxine 75 MCG tablet Commonly known as: SYNTHROID Take 1 tablet (75 mcg total) by mouth daily before breakfast.   loratadine 10 MG tablet Commonly known as: CLARITIN Take 10 mg by mouth daily.   magnesium oxide 400 (240 Mg) MG tablet Commonly known as: MAG-OX Take 1 tablet (400 mg total) by mouth 2 (two) times daily.   metoprolol tartrate 100 MG tablet Commonly known as:  LOPRESSOR TAKE 1 TABLET BY MOUTH TWICE  DAILY   MULTIVITAMIN PO Take 1 tablet by mouth daily.   nitroGLYCERIN 0.4 MG SL tablet Commonly known as: NITROSTAT Place 0.4 mg under the tongue every 5 (five) minutes as needed for chest pain (Up to 3 times).   pantoprazole 40 MG tablet Commonly known as: PROTONIX TAKE 2 TABLETS BY MOUTH EVERY DAY What changed:  how much to take when to take this   polyethylene glycol 17 g packet Commonly known as: MIRALAX / GLYCOLAX Take 17 g by mouth at bedtime.   sodium bicarbonate 650 MG tablet Take 650 mg by mouth 2 (two) times daily.   Vitamin D-3 125 MCG (5000 UT) Tabs Take 2,000 Units by mouth daily.        Discharge Exam: Filed Weights   05/28/22 1501 05/28/22 1817 05/30/22 2114  Weight: 112.5 kg 112.5 kg 105.2 kg   General exam: awake, alert, no acute distress HEENT: atraumatic, clear conjunctiva, anicteric sclera, moist mucus membranes, hearing grossly normal  Respiratory system: CTAB, no wheezes, rales or rhonchi, normal respiratory effort. Cardiovascular system: normal S1/S2, RRR, trace LE edema.   Gastrointestinal system: soft, NT, ND, no HSM felt, +bowel sounds. Central nervous system: A&O x4. no gross focal neurologic deficits, normal speech Extremities: moves all, no edema, normal tone Skin: dry, intact, normal temperature, normal color, No rashes, lesions or ulcers Psychiatry: normal mood, congruent affect, judgement and insight appear normal   Condition at discharge: stable  The results of significant diagnostics from this hospitalization (including imaging, microbiology, ancillary and laboratory) are listed below for reference.   Imaging Studies: ECHOCARDIOGRAM COMPLETE  Result Date: 05/29/2022    ECHOCARDIOGRAM REPORT   Patient Name:   AKASH WINSKI Kallman Date of Exam: 05/29/2022 Medical Rec #:  [id number]      Height:       70.0 in Accession #:    [id number]     Weight:       248.0 lb Date of Birth:  1946-02-02      BSA:           2.287 m Patient Age:    35 years       BP:           118/57 mmHg Patient Gender: M              HR:           57 bpm. Exam Location:  [facility] Procedure: 2D Echo, Color Doppler, Cardiac Doppler and Intracardiac            Opacification Agent Indications:     Acute Systolic CHF  History:  Patient has prior history of Echocardiogram examinations. CAD                  and Previous Myocardial Infarction; Risk Factors:Diabetes.  Sonographer:     L. Thornton-Maynard Referring Phys:  [id number] [place] Diagnosing Phys: Ida Rogue MD  Sonographer Comments: Suboptimal apical window. IMPRESSIONS  1. Left ventricular ejection fraction, by estimation, is 35 to 40%. The left ventricle has moderately decreased function. The left ventricle demonstrates global hypokinesis. The left ventricular internal cavity size was mildly dilated. Left ventricular diastolic parameters are consistent with Grade II diastolic dysfunction (pseudonormalization).  2. Right ventricular systolic function is mildly reduced. The right ventricular size is normal. There is normal pulmonary artery systolic pressure. The estimated right ventricular systolic pressure is 56.4 mmHg.  3. The mitral valve is normal in structure. Mild mitral valve regurgitation. No evidence of mitral stenosis.  4. Tricuspid valve regurgitation is mild to moderate.  5. The aortic valve has an indeterminant number of cusps. There is moderate calcification of the aortic valve. Aortic valve regurgitation is not visualized. Aortic valve sclerosis/calcification is present, without any evidence of aortic stenosis.  6. The inferior vena cava is normal in size with greater than 50% respiratory variability, suggesting right atrial pressure of 3 mmHg. FINDINGS  Left Ventricle: Left ventricular ejection fraction, by estimation, is 35 to 40%. The left ventricle has moderately decreased function. The left ventricle demonstrates global hypokinesis. Definity contrast agent was  given IV to delineate the left ventricular endocardial borders. The left ventricular internal cavity size was mildly dilated. There is no left ventricular hypertrophy. Left ventricular diastolic parameters are consistent with Grade II diastolic dysfunction (pseudonormalization). Right Ventricle: The right ventricular size is normal. No increase in right ventricular wall thickness. Right ventricular systolic function is mildly reduced. There is normal pulmonary artery systolic pressure. The tricuspid regurgitant velocity is 2.45 m/s, and with an assumed right atrial pressure of 3 mmHg, the estimated right ventricular systolic pressure is 33.2 mmHg. Left Atrium: Left atrial size was normal in size. Right Atrium: Right atrial size was normal in size. Pericardium: There is no evidence of pericardial effusion. Mitral Valve: The mitral valve is normal in structure. The E-point septal separation is normal. Mild mitral valve regurgitation. No evidence of mitral valve stenosis. MV peak gradient, 8.4 mmHg. The mean mitral valve gradient is 3.0 mmHg. Tricuspid Valve: The tricuspid valve is normal in structure. Tricuspid valve regurgitation is mild to moderate. No evidence of tricuspid stenosis. Aortic Valve: The aortic valve has an indeterminant number of cusps. There is moderate calcification of the aortic valve. Aortic valve regurgitation is not visualized. Aortic valve sclerosis/calcification is present, without any evidence of aortic stenosis. Aortic valve mean gradient measures 6.0 mmHg. Aortic valve peak gradient measures 10.6 mmHg. Aortic valve area, by VTI measures 2.07 cm. Pulmonic Valve: The pulmonic valve was normal in structure. Pulmonic valve regurgitation is not visualized. No evidence of pulmonic stenosis. Aorta: The aortic root is normal in size and structure. Venous: The inferior vena cava is normal in size with greater than 50% respiratory variability, suggesting right atrial pressure of 3 mmHg. IAS/Shunts:  No atrial level shunt detected by color flow Doppler.  LEFT VENTRICLE PLAX 2D LVIDd:         5.90 cm      Diastology LVIDs:         4.80 cm      LV e' medial:    4.32 cm/s LV PW:  0.80 cm      LV E/e' medial:  29.2 LV IVS:        1.30 cm      LV e' lateral:   9.58 cm/s LVOT diam:     2.20 cm      LV E/e' lateral: 13.2 LV SV:         84 LV SV Index:   37 LVOT Area:     3.80 cm  LV Volumes (MOD) LV vol d, MOD A2C: 204.0 ml LV vol d, MOD A4C: 213.0 ml LV vol s, MOD A2C: 80.5 ml LV vol s, MOD A4C: 86.0 ml LV SV MOD A2C:     123.5 ml LV SV MOD A4C:     213.0 ml LV SV MOD BP:      125.1 ml RIGHT VENTRICLE             IVC RV Basal diam:  4.50 cm     IVC diam: 2.80 cm RV S prime:     10.50 cm/s TAPSE (M-mode): 1.7 cm LEFT ATRIUM             Index        RIGHT ATRIUM           Index LA diam:        3.70 cm 1.62 cm/m   RA Area:     16.20 cm LA Vol (A2C):   85.8 ml 37.51 ml/m  RA Volume:   40.10 ml  17.53 ml/m LA Vol (A4C):   87.7 ml 38.34 ml/m LA Biplane Vol: 88.1 ml 38.52 ml/m  AORTIC VALVE                     PULMONIC VALVE AV Area (Vmax):    2.02 cm      PV Vmax:          1.23 m/s AV Area (Vmean):   1.92 cm      PV Peak grad:     6.0 mmHg AV Area (VTI):     2.07 cm      PR End Diast Vel: 5.95 msec AV Vmax:           163.00 cm/s AV Vmean:          113.000 cm/s AV VTI:            0.404 m AV Peak Grad:      10.6 mmHg AV Mean Grad:      6.0 mmHg LVOT Vmax:         86.45 cm/s LVOT Vmean:        57.100 cm/s LVOT VTI:          0.220 m LVOT/AV VTI ratio: 0.55  AORTA Ao Root diam: 2.70 cm Ao Asc diam:  2.90 cm MITRAL VALVE                TRICUSPID VALVE MV Area (PHT): 5.34 cm     TR Peak grad:   24.0 mmHg MV Area VTI:   1.72 cm     TR Vmax:        245.00 cm/s MV Peak grad:  8.4 mmHg MV Mean grad:  3.0 mmHg     SHUNTS MV Vmax:       1.45 m/s     Systemic VTI:  0.22 m MV Vmean:      80.5 cm/s    Systemic Diam: 2.20 cm MV Decel Time: 142 msec MV E velocity: 126.00  cm/s MV A velocity: 63.90 cm/s MV E/A ratio:  1.97  Ida Rogue MD Electronically signed by Ida Rogue MD Signature Date/Time: 05/29/2022/2:25:16 PM    Final    DG Chest 2 View  Result Date: 05/28/2022 CLINICAL DATA:  Shortness of breath in a 77 year old male. EXAM: CHEST - 2 VIEW COMPARISON:  August 07, 2021 FINDINGS: Post median sternotomy for CABG and LIMA. Cardiomediastinal contours and hilar structures are stable with signs of cardiac enlargement. Diffuse patchy interstitial thickening and patchy opacities throughout the chest greatest in the LEFT mid and lower chest, also present in the RIGHT upper chest, some areas appear vaguely nodular. Biapical pleural and parenchymal scarring. No lobar level consolidative process. No sign of pneumothorax. No pleural effusion. Osteopenia without acute skeletal findings. IMPRESSION: Diffuse mild interstitial thickening and patchy opacities in the chest suspicious for multifocal infection, would also consider asymmetric edema. Given more nodular appearance of LEFT upper lobe and mid chest changes would suggest follow-up PA and lateral chest radiograph outside of the acute setting to ensure resolution. Electronically Signed   By: Zetta Bills M.D.   On: 05/28/2022 15:58    Microbiology: Results for orders placed or performed during the hospital encounter of 05/28/22  Resp panel by RT-PCR (RSV, Flu A&B, Covid) Anterior Nasal Swab     Status: None   Collection Time: 05/28/22  8:19 PM   Specimen: Anterior Nasal Swab  Result Value Ref Range Status   SARS Coronavirus 2 by RT PCR NEGATIVE NEGATIVE Final    Comment: (NOTE) SARS-CoV-2 target nucleic acids are NOT DETECTED.  The SARS-CoV-2 RNA is generally detectable in upper respiratory specimens during the acute phase of infection. The lowest concentration of SARS-CoV-2 viral copies this assay can detect is 138 copies/mL. A negative result does not preclude SARS-Cov-2 infection and should not be used as the sole basis for treatment or other patient  management decisions. A negative result may occur with  improper specimen collection/handling, submission of specimen other than nasopharyngeal swab, presence of viral mutation(s) within the areas targeted by this assay, and inadequate number of viral copies(<138 copies/mL). A negative result must be combined with clinical observations, patient history, and epidemiological information. The expected result is Negative.  Fact Sheet for Patients:  EntrepreneurPulse.com.au  Fact Sheet for Healthcare Providers:  IncredibleEmployment.be  This test is no t yet approved or cleared by the [place] FDA and  has been authorized for detection and/or diagnosis of SARS-CoV-2 by FDA under an Emergency Use Authorization (EUA). This EUA will remain  in effect (meaning this test can be used) for the duration of the COVID-19 declaration under Section 564(b)(1) of the Act, 21 U.S.C.section 360bbb-3(b)(1), unless the authorization is terminated  or revoked sooner.       Influenza A by PCR NEGATIVE NEGATIVE Final   Influenza B by PCR NEGATIVE NEGATIVE Final    Comment: (NOTE) The Xpert Xpress SARS-CoV-2/FLU/RSV plus assay is intended as an aid in the diagnosis of influenza from Nasopharyngeal swab specimens and should not be used as a sole basis for treatment. Nasal washings and aspirates are unacceptable for Xpert Xpress SARS-CoV-2/FLU/RSV testing.  Fact Sheet for Patients: EntrepreneurPulse.com.au  Fact Sheet for Healthcare Providers: IncredibleEmployment.be  This test is not yet approved or cleared by the [place] FDA and has been authorized for detection and/or diagnosis of SARS-CoV-2 by FDA under an Emergency Use Authorization (EUA). This EUA will remain in effect (meaning this test can be used) for the duration of the COVID-19  declaration under Section 564(b)(1) of the Act, 21 U.S.C. section 360bbb-3(b)(1),  unless the authorization is terminated or revoked.     Resp Syncytial Virus by PCR NEGATIVE NEGATIVE Final    Comment: (NOTE) Fact Sheet for Patients: EntrepreneurPulse.com.au  Fact Sheet for Healthcare Providers: IncredibleEmployment.be  This test is not yet approved or cleared by the [place] FDA and has been authorized for detection and/or diagnosis of SARS-CoV-2 by FDA under an Emergency Use Authorization (EUA). This EUA will remain in effect (meaning this test can be used) for the duration of the COVID-19 declaration under Section 564(b)(1) of the Act, 21 U.S.C. section 360bbb-3(b)(1), unless the authorization is terminated or revoked.  Performed at [facility], [address] [place], [place] [address]     Labs: CBC: Recent Labs  Lab 05/25/22 0831 05/28/22 1504 05/29/22 0404 05/30/22 0433 05/31/22 0537  WBC 8.5 6.5 6.4 5.8 6.2  NEUTROABS  --  4.9  --   --   --   HGB 9.6* 9.1* 8.3* 8.3* 9.3*  HCT 31.1* 29.4* 26.1* 26.7* 29.2*  MCV 92.3 91.3 90.0 89.6 87.4  PLT 134* 143* 124* 136* 128   Basic Metabolic Panel: Recent Labs  Lab 05/25/22 0843 05/28/22 1504 05/29/22 0404 05/30/22 0433 05/31/22 0537  NA 139 143 146* 146* 145  K 5.5* 4.5 4.0 3.8 3.9  CL 107 115* 114* 113* 108  CO2 '25 22 23 24 26  '$ GLUCOSE 141* 140* 120* 97 104*  BUN 56* 66* 67* 65* 63*  CREATININE 4.23* 4.10* 4.11* 4.10* 4.21*  CALCIUM 9.3 9.1 9.1 9.0 9.3  MG  --   --   --  2.7*  --   PHOS  --   --   --  4.2  --    Liver Function Tests: Recent Labs  Lab 05/28/22 1504 05/30/22 0433  AST 46*  --   ALT 43  --   ALKPHOS 126  --   BILITOT 1.4*  --   PROT 6.8  --   ALBUMIN 3.5 3.2*   CBG: Recent Labs  Lab 05/30/22 1132 05/30/22 1605 05/30/22 2116 05/31/22 0841 05/31/22 1218  GLUCAP 113* 106* 100* 96 120*    Discharge time spent: greater than 30 minutes.  Signed: Ezekiel Slocumb, DO [facility] 05/31/2022

## 2022-05-31 NOTE — Progress Notes (Signed)
  Transition of Care [facility]) Screening Note   Patient Details  Name: Ronald [name] Date of Birth: 08-26-1945   Transition of Care [facility]) CM/SW Contact:    Tiburcio Bash, LCSW Phone Number: 05/31/2022, 10:26 AM    Transition of Care Department [name]) has reviewed patient and no TOC needs have been identified at this time. We will continue to monitor patient advancement through interdisciplinary progression rounds. If new patient transition needs arise, please place a TOC consult.  [place]by Fam, [place], MSW, [place]

## 2022-06-01 ENCOUNTER — Telehealth: Payer: Self-pay

## 2022-06-01 LAB — HEMOGLOBIN A1C
Hgb A1c MFr Bld: 6 % — ABNORMAL HIGH (ref 4.8–5.6)
Hgb A1c MFr Bld: 6.1 % — ABNORMAL HIGH (ref 4.8–5.6)
Mean Plasma Glucose: 126 mg/dL
Mean Plasma Glucose: 128 mg/dL

## 2022-06-01 NOTE — Telephone Encounter (Signed)
Transition Care Management Unsuccessful Follow-up Telephone Call  Date of discharge and from where:  [facility] 05/31/2022  Attempts:  1st Attempt  Reason for unsuccessful TCM follow-up call:  Left voice message Juanda Crumble, [place] Direct Dial [phone]

## 2022-06-02 ENCOUNTER — Telehealth: Payer: Self-pay | Admitting: *Deleted

## 2022-06-02 DIAGNOSIS — N2581 Secondary hyperparathyroidism of renal origin: Secondary | ICD-10-CM | POA: Diagnosis not present

## 2022-06-02 DIAGNOSIS — N185 Chronic kidney disease, stage 5: Secondary | ICD-10-CM | POA: Diagnosis not present

## 2022-06-02 DIAGNOSIS — I129 Hypertensive chronic kidney disease with stage 1 through stage 4 chronic kidney disease, or unspecified chronic kidney disease: Secondary | ICD-10-CM | POA: Diagnosis not present

## 2022-06-02 DIAGNOSIS — R600 Localized edema: Secondary | ICD-10-CM | POA: Diagnosis not present

## 2022-06-02 DIAGNOSIS — I10 Essential (primary) hypertension: Secondary | ICD-10-CM | POA: Diagnosis not present

## 2022-06-02 DIAGNOSIS — I701 Atherosclerosis of renal artery: Secondary | ICD-10-CM | POA: Diagnosis not present

## 2022-06-02 NOTE — Patient Outreach (Signed)
  Care Coordination [facility] Note Transition Care Management Unsuccessful Follow-up Telephone Call  Date of discharge and from where:  [facility] [id number]  Attempts:  2nd Attempt   Reason for unsuccessful TCM follow-up call:  Unable to reach patient Per person answering patient was not available. RN left message for call back  [place]ana Diaz Management [phone]

## 2022-06-02 NOTE — Patient Outreach (Signed)
  Care Coordination [facility] Note Transition Care Management Follow-up Telephone Call Date of discharge and from where: [facility] [phone] How have you been since you were released from the hospital? I am doing pretty good Any questions or concerns? No  Items Reviewed: Did the pt receive and understand the discharge instructions provided? Yes  Medications obtained and verified? Yes  Other? Yes  RN discussed the changes with the Lasix and the changes that Dr made since BP dropped  Any new allergies since your discharge? No  Dietary orders reviewed? Yes Low sodium heart healthy Do you have support at home? Yes   Home Care and Equipment/Supplies: Were home health services ordered? no If so, what is the name of the agency? N   Has the agency set up a time to come to the patient's home? no Were any new equipment or medical supplies ordered?  No What is the name of the medical supply agency? n Were you able to get the supplies/equipment? not applicable Do you have any questions related to the use of the equipment or supplies? No  Functional Questionnaire: (I = Independent and D = Dependent) ADLs: I  Bathing/Dressing- I  Meal Prep- I  Eating- I  Maintaining continence- I  Transferring/Ambulation- I  Managing Meds- I  Follow up appointments reviewed:  PCP Hospital f/u appt confirmed? Yes  Halina Maidens .[id number] 2:20 [facility] f/u appt confirmed? Yes   Afib clinic [id number] 2:30 Darylene Price CHF clinic [id number] 1:30 Are transportation arrangements needed? No  If their condition worsens, is the pt aware to call PCP or go to the Emergency Dept.? Yes Was the patient provided with contact information for the PCP's office or ED? Yes Was to pt encouraged to call back with questions or concerns? Yes  SDOH assessments and interventions completed:   Yes SDOH Interventions Today    Flowsheet Row Most Recent Value  SDOH Interventions   Food Insecurity Interventions Intervention Not  Indicated  Housing Interventions Intervention Not Indicated  Transportation Interventions Intervention Not Indicated       Care Coordination Interventions:  RN made patient aware of [employer] meal plan post discharge and how to activate.    Encounter Outcome:  Pt. Visit Completed   [place] [facility] [phone]

## 2022-06-03 NOTE — Telephone Encounter (Signed)
Transition Care Management Follow-up Telephone Call Date of discharge and from where: [place] 05/31/2022 How have you been since you were released from the hospital? same Any questions or concerns? No  Items Reviewed: Did the pt receive and understand the discharge instructions provided? Yes  Medications obtained and verified? Yes  Other? No  Any new allergies since your discharge? No  Dietary orders reviewed? Yes Do you have support at home? Yes   Home Care and Equipment/Supplies: Were home health services ordered? no If so, what is the name of the agency? N/a  Has the agency set up a time to come to the patient's home? not applicable Were any new equipment or medical supplies ordered?  No What is the name of the medical supply agency? N/a Were you able to get the supplies/equipment? not applicable Do you have any questions related to the use of the equipment or supplies? No  Functional Questionnaire: (I = Independent and D = Dependent) ADLs: I  Bathing/Dressing- I  Meal Prep- I  Eating- I  Maintaining continence- I  Transferring/Ambulation- I  Managing Meds- I  Follow up appointments reviewed:  PCP Hospital f/u appt confirmed? Yes  Scheduled to see Dr Army Melia on 06/07/2022 @ 2:20. [facility] f/u appt confirmed? Yes  Scheduled to see  on 06/02/2022 @ 11:00. Are transportation arrangements needed? No  If their condition worsens, is the pt aware to call PCP or go to the Emergency Dept.? Yes Was the patient provided with contact information for the PCP's office or ED? Yes Was to pt encouraged to call back with questions or concerns? Yes Juanda Crumble, LPN [place] [facility] [phone]

## 2022-06-04 ENCOUNTER — Telehealth: Payer: Self-pay

## 2022-06-04 NOTE — Telephone Encounter (Signed)
Per Dr Army Melia - "this pt has hosp f/u on Monday after CHF, renal issues. He is seeing Cardiology on Tuesday and Nephrology already ordered follow up labs. I do not need to see him - please call him to be sure there is nothing that I need to address."  Called and spoke with patient. He said he has nothing that needs to be addressed, and does not need to come to appt. Canceled appt per patient and Dr Army Melia.  Ronald [name]

## 2022-06-07 ENCOUNTER — Telehealth: Payer: Medicare Other

## 2022-06-07 ENCOUNTER — Ambulatory Visit: Payer: Medicare Other | Admitting: Internal Medicine

## 2022-06-08 ENCOUNTER — Ambulatory Visit (HOSPITAL_COMMUNITY): Payer: Medicare Other | Admitting: Nurse Practitioner

## 2022-06-13 NOTE — Progress Notes (Unsigned)
   Patient ID: Ronald [name] [facility], male    DOB: September 17, 1945, 76 y.o.   MRN: [id number]  HPI  Ronald [name] is a 77 y/o male with a history of  Echo 05/29/22 showed an EF of 35-40% along with normal PA pressure, mild Ronald and mild/moderate TR.   LHC 07/28/17 showed:  Ost RCA to Prox RCA lesion is 95% stenosed. Prox RCA lesion is 100% stenosed. Prox Cx lesion is 100% stenosed. Prox LAD lesion is 100% stenosed. Origin lesion is 100% stenosed. Origin lesion is 100% stenosed. Previously placed Dist Graft to Insertion stent (unknown type) is widely patent. Origin to Prox Graft lesion is 10% stenosed. Prox Graft lesion is 30% stenosed. Mid Graft to Dist Graft lesion is 30% stenosed. Mid Graft lesion is 50% stenosed.   Significant native CAD with total occlusion of the LAD after the takeoff of the first diagonal vessel; total occlusion of the proximal left circumflex coronary artery; and total occlusion of the proximal RCA with antegrade bridging collaterals.  Patent LIMA to LAD.  Patent Y vein graft from the 1996 surgery which supplies the diagonal vessel and distal circumflex marginal vessel.  There is diffuse narrowing of 50% in the midportion of the Y graft supplying the diagonal vessel and a patent distal stent extending to the ostium of the diagonal.  The Y graft supplying the distal marginal is free of significant disease and has mild luminal irregularity. Occluded vein graft which had supplied the distal RCA from the 1996 surgery. Patent SVG supplying the distal RCA from the 2007 surgery with a stent in the proximal portion of the graft with 30% narrowings in the proximal and mid segment. Occluded vein graft from 2007 which appears to also have been stented. LVEDP 18 mm  Admitted 05/28/22 due to a/c HF. Diuresed with lasix gtt. Cardiology/ nephrology consults obtained.   He presents today for his initial visit with a chief complaint of  Review of Systems    Physical Exam  Assessment &  Plan:  1: Chronic heart failure with reduced ejection fraction - NYHA class  - GDMT of - BNP 05/28/22 was 788.6  2: HTN w/ CKD- - BP - saw nephrology Ronald [name]) 06/02/22 - BMP 05/31/22 showed sodium 145, potassium 3.9, creatinine 4.21 and GFR 14  3: DM- - saw PCP Ronald [name]) 04/06/22 - A1c 05/29/22 was 6.1%  4: PAF- - saw cardiology Ronald [name]) 05/27/22

## 2022-06-15 ENCOUNTER — Encounter: Payer: Self-pay | Admitting: Pharmacy Technician

## 2022-06-15 ENCOUNTER — Ambulatory Visit: Payer: Medicare Other | Attending: Family | Admitting: Family

## 2022-06-15 ENCOUNTER — Encounter: Payer: Self-pay | Admitting: Family

## 2022-06-15 VITALS — BP 103/68 | HR 96 | Resp 18 | Wt 227.0 lb

## 2022-06-15 DIAGNOSIS — I10 Essential (primary) hypertension: Secondary | ICD-10-CM | POA: Diagnosis not present

## 2022-06-15 DIAGNOSIS — R42 Dizziness and giddiness: Secondary | ICD-10-CM | POA: Diagnosis not present

## 2022-06-15 DIAGNOSIS — N185 Chronic kidney disease, stage 5: Secondary | ICD-10-CM | POA: Diagnosis not present

## 2022-06-15 DIAGNOSIS — R531 Weakness: Secondary | ICD-10-CM | POA: Insufficient documentation

## 2022-06-15 DIAGNOSIS — J189 Pneumonia, unspecified organism: Secondary | ICD-10-CM | POA: Diagnosis not present

## 2022-06-15 DIAGNOSIS — N184 Chronic kidney disease, stage 4 (severe): Secondary | ICD-10-CM | POA: Diagnosis not present

## 2022-06-15 DIAGNOSIS — Z6833 Body mass index (BMI) 33.0-33.9, adult: Secondary | ICD-10-CM | POA: Diagnosis not present

## 2022-06-15 DIAGNOSIS — I2510 Atherosclerotic heart disease of native coronary artery without angina pectoris: Secondary | ICD-10-CM | POA: Diagnosis not present

## 2022-06-15 DIAGNOSIS — I5042 Chronic combined systolic (congestive) and diastolic (congestive) heart failure: Secondary | ICD-10-CM | POA: Insufficient documentation

## 2022-06-15 DIAGNOSIS — I130 Hypertensive heart and chronic kidney disease with heart failure and stage 1 through stage 4 chronic kidney disease, or unspecified chronic kidney disease: Secondary | ICD-10-CM | POA: Diagnosis not present

## 2022-06-15 DIAGNOSIS — K219 Gastro-esophageal reflux disease without esophagitis: Secondary | ICD-10-CM | POA: Diagnosis not present

## 2022-06-15 DIAGNOSIS — E1122 Type 2 diabetes mellitus with diabetic chronic kidney disease: Secondary | ICD-10-CM

## 2022-06-15 DIAGNOSIS — G8929 Other chronic pain: Secondary | ICD-10-CM | POA: Diagnosis not present

## 2022-06-15 DIAGNOSIS — I480 Paroxysmal atrial fibrillation: Secondary | ICD-10-CM | POA: Diagnosis not present

## 2022-06-15 DIAGNOSIS — Z8679 Personal history of other diseases of the circulatory system: Secondary | ICD-10-CM | POA: Diagnosis not present

## 2022-06-15 DIAGNOSIS — I959 Hypotension, unspecified: Secondary | ICD-10-CM | POA: Diagnosis not present

## 2022-06-15 DIAGNOSIS — I5020 Unspecified systolic (congestive) heart failure: Secondary | ICD-10-CM | POA: Diagnosis not present

## 2022-06-15 DIAGNOSIS — J449 Chronic obstructive pulmonary disease, unspecified: Secondary | ICD-10-CM | POA: Insufficient documentation

## 2022-06-15 DIAGNOSIS — J439 Emphysema, unspecified: Secondary | ICD-10-CM | POA: Diagnosis not present

## 2022-06-15 DIAGNOSIS — I252 Old myocardial infarction: Secondary | ICD-10-CM | POA: Diagnosis not present

## 2022-06-15 DIAGNOSIS — Z951 Presence of aortocoronary bypass graft: Secondary | ICD-10-CM | POA: Diagnosis not present

## 2022-06-15 DIAGNOSIS — I420 Dilated cardiomyopathy: Secondary | ICD-10-CM | POA: Insufficient documentation

## 2022-06-15 DIAGNOSIS — I255 Ischemic cardiomyopathy: Secondary | ICD-10-CM | POA: Insufficient documentation

## 2022-06-15 DIAGNOSIS — Z87891 Personal history of nicotine dependence: Secondary | ICD-10-CM | POA: Insufficient documentation

## 2022-06-15 DIAGNOSIS — M109 Gout, unspecified: Secondary | ICD-10-CM | POA: Diagnosis not present

## 2022-06-15 DIAGNOSIS — R634 Abnormal weight loss: Secondary | ICD-10-CM | POA: Insufficient documentation

## 2022-06-15 DIAGNOSIS — E785 Hyperlipidemia, unspecified: Secondary | ICD-10-CM | POA: Diagnosis not present

## 2022-06-15 NOTE — Patient Instructions (Addendum)
Continue weighing daily and call for an overnight weight gain of 3 pounds or more or a weekly weight gain of more than 5 pounds.   If you have voicemail, please make sure your mailbox is cleaned out so that we may leave a message and please make sure to listen to any voicemails.    Decrease your furosemide to '40mg'$  every morning with additional '40mg'$  if needed for above weight gain or return of any swelling.

## 2022-06-15 NOTE — Progress Notes (Signed)
[facility] - PHARMACIST COUNSELING NOTE  Guideline-Directed Medical Therapy/Evidence Based Medicine  ACE/ARB/ARNI:  None Beta Blocker: Metoprolol tartrate 100 mg twice daily Aldosterone Antagonist:  None Diuretic: Furosemide 80 mg daily SGLT2i:  None  Adherence Assessment  Do you ever forget to take your medication? '[]'$ Yes '[x]'$ No  Do you ever skip doses due to side effects? '[]'$ Yes '[x]'$ No  Do you have trouble affording your medicines? '[]'$ Yes '[x]'$ No  Are you ever unable to pick up your medication due to transportation difficulties? '[]'$ Yes '[x]'$ No  Do you ever stop taking your medications because you don't believe they are helping? '[]'$ Yes '[x]'$ No  Do you check your weight daily? '[x]'$ Yes '[]'$ No   Adherence strategy: Pill box and with wife's help  Barriers to obtaining medications: None  Vital signs: HR 96, BP 103/68, weight (pounds) 227 ECHO: Date 04/2022, EF 35-40%     Latest Ref Rng & Units 05/31/2022    5:37 AM 05/30/2022    4:33 AM 05/29/2022    4:04 AM  BMP  Glucose 70 - 99 mg/dL 104  97  120   BUN 8 - 23 mg/dL 63  65  67   Creatinine 0.61 - 1.24 mg/dL 4.21  4.10  4.11   Sodium 135 - 145 mmol/L 145  146  146   Potassium 3.5 - 5.1 mmol/L 3.9  3.8  4.0   Chloride 98 - 111 mmol/L 108  113  114   CO2 22 - 32 mmol/L '26  24  23   '$ Calcium 8.9 - 10.3 mg/dL 9.3  9.0  9.1     Past Medical History:  Diagnosis Date   AAA (abdominal aortic aneurysm) ([place])    a. 3cm by [place] 2015.   Arthritis    "hips; back" (12/13/2014)   CAD (coronary artery disease) 2007   a. s/p CABG- IMA-LAD, VG-Cx, VG-RCA, VG-diag in 1999. B. sp redo CABG- VG-OM, VG-RCA in 2007 due to VG disease. c. NSTEMI 11/2014 s/p DES to SVG-OM from the Y graft.d. PTCA/DES x 1 distal body of SVG to Diagonal.09/2015   Chronic combined systolic and diastolic CHF (congestive heart failure) ([place])    a. remote EF 40-45% in 2006. b. Normal EF 2014. b. Echo 07/2016 EF 45-50%, grade 1 DD. c. Echo  2020 30% to 35%. Diffuse EF 30-35%, diffuse hypokinesis.   Chronic lower back pain    CKD (chronic kidney disease), stage IV (HCC)    COPD (chronic obstructive pulmonary disease) (HCC)    Deafness in left ear    Degenerative disc disease, lumbar    Diabetes mellitus, type 2 ([place]) 10/04/2014   Microalbumin 05/11/2012-100. Foot exam/monofilament 05/11/2012-normal.   Dilated cardiomyopathy ([place]) 10/07/2015   Emphysema    Esophageal stricture 07/02/1998   EGD   Genital candidiasis in male 10/25/2012   GERD (gastroesophageal reflux disease)    History of gout    "last flareup was in 2007" (12/13/2014)   History of hiatal hernia    Hyperlipidemia    Hypertension    Ischemic cardiomyopathy 2006   Echo 2020: EF 30-35%, diffuse hypokinesis   NSTEMI (non-ST elevated myocardial infarction) ([place]) 12/13/2014   PVC's (premature ventricular contractions)    Renal artery stenosis ([place])    a. noted on CT 2008.   Type II diabetes mellitus ([place])    Diet control    Unstable angina ([place]) 07/27/2017   Walking pneumonia 1990's   Wears dentures    full upper    ASSESSMENT  77 year old male with PMH HTN, Afib, CAD, CKD4 (BL Scr 3.5, eGFR 17) who presents to the HF clinic as a new patient. Most recent ECHO in 04/2022 shows EF 35-40%. Regarding GDMT, patient is taking Lopressor 100 mg twice daily. Patient also takes furosemide 80 mg daily. Patient endorses orthostasis when standing up from seated or lying down position. Patient was taking irbesartan but it was discontinued due to hyperkalemia, and he was taking amlodipine which was discontinued due to hypotension. Patient's CKD and eGFR precludes initiation of SGLT2i or MRA. Regarding fluid status, patient is down 20 lbs from 04/2022 and reports that he hasn't seen his legs this slender in years.  Patient was cardioverted for his Afib on 05/25/2022.   Recent ED Visit (past 6 months): Date - 05/28/2022, CC - AoCHF  PLAN CHF/HTN Recommend decreasing  furosemide dose. After discussion with Darylene Price, will reduce Lasix to 40 mg daily. Recommend against additional onboarding of heart failure therapy at this time due to CKD and orthostasis Continue metoprolol tartrate and Imdur  Afib CHADSVASc 4 Continue Eliquis and metoprolol tartrate  CAD 10/2021 LDL 82 Continue atorvastatin and Imdur   Time spent: 20 minutes  Will M. Ouida Sills, PharmD PGY-1 Pharmacy Resident 06/15/2022 3:06 PM   Current Outpatient Medications:    acetaminophen (TYLENOL) 650 MG CR tablet, Take 1,300 mg by mouth at bedtime., Disp: , Rfl:    allopurinol (ZYLOPRIM) 100 MG tablet, TAKE 1 TABLET BY MOUTH EVERYDAY AT BEDTIME, Disp: 90 tablet, Rfl: 3   amiodarone (PACERONE) 200 MG tablet, Take 400 mg by mouth daily., Disp: , Rfl:    apixaban (ELIQUIS) 5 MG TABS tablet, Take 1 tablet (5 mg total) by mouth 2 (two) times daily., Disp: 180 tablet, Rfl: 1   atorvastatin (LIPITOR) 40 MG tablet, Take 1 tablet (40 mg total) by mouth daily. (Patient taking differently: Take 40 mg by mouth at bedtime.), Disp: 90 tablet, Rfl: 2   Cholecalciferol (VITAMIN D-3) 125 MCG (5000 UT) TABS, Take 2,000 Units by mouth daily. , Disp: , Rfl:    furosemide (LASIX) 80 MG tablet, Take 1 tablet (80 mg total) by mouth daily. (Patient taking differently: Take 40 mg by mouth daily. And extra '40mg'$  PRN for weight gain or swelling), Disp: 30 tablet, Rfl: 1   isosorbide mononitrate (IMDUR) 30 MG 24 hr tablet, TAKE 1 TABLET BY MOUTH DAILY (Patient taking differently: Take 30 mg by mouth daily.), Disp: 90 tablet, Rfl: 3   levothyroxine (SYNTHROID) 75 MCG tablet, Take 1 tablet (75 mcg total) by mouth daily before breakfast., Disp: 90 tablet, Rfl: 2   loratadine (CLARITIN) 10 MG tablet, Take 10 mg by mouth daily. (Patient not taking: Reported on 06/15/2022), Disp: , Rfl:    magnesium oxide (MAG-OX) 400 (240 Mg) MG tablet, Take 1 tablet (400 mg total) by mouth 2 (two) times daily., Disp: 180 tablet, Rfl: 1    metoprolol tartrate (LOPRESSOR) 100 MG tablet, TAKE 1 TABLET BY MOUTH TWICE  DAILY (Patient taking differently: Take 100 mg by mouth 2 (two) times daily.), Disp: 180 tablet, Rfl: 3   Multiple Vitamin (MULTIVITAMIN PO), Take 1 tablet by mouth daily., Disp: , Rfl:    nitroGLYCERIN (NITROSTAT) 0.4 MG SL tablet, Place 0.4 mg under the tongue every 5 (five) minutes as needed for chest pain (Up to 3 times). , Disp: , Rfl:    pantoprazole (PROTONIX) 40 MG tablet, TAKE 2 TABLETS BY MOUTH EVERY DAY (Patient taking differently: Take 40 mg by mouth  2 (two) times daily.), Disp: 180 tablet, Rfl: 1   polyethylene glycol (MIRALAX / GLYCOLAX) 17 g packet, Take 17 g by mouth at bedtime., Disp: , Rfl:    sodium bicarbonate 650 MG tablet, Take 650 mg by mouth 2 (two) times daily., Disp: , Rfl:    DRUGS TO CAUTION IN HEART FAILURE  Drug or Class Mechanism  Analgesics NSAIDs COX-2 inhibitors Glucocorticoids  Sodium and water retention, increased systemic vascular resistance, decreased response to diuretics   Diabetes Medications Metformin Thiazolidinediones Rosiglitazone (Avandia) Pioglitazone (Actos) DPP4 Inhibitors Saxagliptin (Onglyza) Sitagliptin (Januvia)   Lactic acidosis Possible calcium channel blockade   Unknown  Antiarrhythmics Class I  Flecainide Disopyramide Class III Sotalol Other Dronedarone  Negative inotrope, proarrhythmic   Proarrhythmic, beta blockade  Negative inotrope  Antihypertensives Alpha Blockers Doxazosin Calcium Channel Blockers Diltiazem Verapamil Nifedipine Central Alpha Adrenergics Moxonidine Peripheral Vasodilators Minoxidil  Increases renin and aldosterone  Negative inotrope    Possible sympathetic withdrawal  Unknown  Anti-infective Itraconazole Amphotericin B  Negative inotrope Unknown  Hematologic Anagrelide Cilostazol   Possible inhibition of PD IV Inhibition of PD III causing arrhythmias   Neurologic/Psychiatric Stimulants Anti-Seizure Drugs Carbamazepine Pregabalin Antidepressants Tricyclics Citalopram Parkinsons Bromocriptine Pergolide Pramipexole Antipsychotics Clozapine Antimigraine Ergotamine Methysergide Appetite suppressants Bipolar Lithium  Peripheral alpha and beta agonist activity  Negative inotrope and chronotrope Calcium channel blockade  Negative inotrope, proarrhythmic Dose-dependent QT prolongation  Excessive serotonin activity/valvular damage Excessive serotonin activity/valvular damage Unknown  IgE mediated hypersensitivy, calcium channel blockade  Excessive serotonin activity/valvular damage Excessive serotonin activity/valvular damage Valvular damage  Direct myofibrillar degeneration, adrenergic stimulation  Antimalarials Chloroquine Hydroxychloroquine Intracellular inhibition of lysosomal enzymes  Urologic Agents Alpha Blockers Doxazosin Prazosin Tamsulosin Terazosin  Increased renin and aldosterone  Adapted from Page Carleene Overlie, et al. "Drugs That May Cause or Exacerbate Heart Failure: A Scientific Statement from the American Heart  Association." Circulation 2016; 134:e32-e69. DOI: 10.1161/CIR.0000000000000426   MEDICATION ADHERENCES TIPS AND STRATEGIES Taking medication as prescribed improves patient outcomes in heart failure (reduces hospitalizations, improves symptoms, increases survival) Side effects of medications can be managed by decreasing doses, switching agents, stopping drugs, or adding additional therapy. Please let someone in the [facility] know if you have having bothersome side effects so we can modify your regimen. Do not alter your medication regimen without talking to [place].  Medication reminders can help patients remember to take drugs on time. If you are missing or forgetting doses you can try linking behaviors, using pill boxes, or an electronic reminder like an alarm on your phone or an app. Some people  can also get automated phone calls as medication reminders.

## 2022-06-17 ENCOUNTER — Ambulatory Visit (HOSPITAL_COMMUNITY)
Admission: RE | Admit: 2022-06-17 | Discharge: 2022-06-17 | Disposition: A | Discharge status: Home / Self Care | Payer: Medicare Other | Source: Ambulatory Visit | Attending: Nurse Practitioner | Admitting: Nurse Practitioner

## 2022-06-17 ENCOUNTER — Other Ambulatory Visit (HOSPITAL_COMMUNITY): Payer: Self-pay | Admitting: *Deleted

## 2022-06-17 ENCOUNTER — Encounter (HOSPITAL_COMMUNITY): Payer: Self-pay | Admitting: Nurse Practitioner

## 2022-06-17 VITALS — BP 124/78 | HR 95 | Ht 69.0 in | Wt 227.8 lb

## 2022-06-17 DIAGNOSIS — N184 Chronic kidney disease, stage 4 (severe): Secondary | ICD-10-CM | POA: Diagnosis not present

## 2022-06-17 DIAGNOSIS — E1122 Type 2 diabetes mellitus with diabetic chronic kidney disease: Secondary | ICD-10-CM | POA: Diagnosis not present

## 2022-06-17 DIAGNOSIS — I130 Hypertensive heart and chronic kidney disease with heart failure and stage 1 through stage 4 chronic kidney disease, or unspecified chronic kidney disease: Secondary | ICD-10-CM | POA: Insufficient documentation

## 2022-06-17 DIAGNOSIS — I2510 Atherosclerotic heart disease of native coronary artery without angina pectoris: Secondary | ICD-10-CM | POA: Insufficient documentation

## 2022-06-17 DIAGNOSIS — Z8249 Family history of ischemic heart disease and other diseases of the circulatory system: Secondary | ICD-10-CM | POA: Diagnosis not present

## 2022-06-17 DIAGNOSIS — I5042 Chronic combined systolic (congestive) and diastolic (congestive) heart failure: Secondary | ICD-10-CM | POA: Diagnosis not present

## 2022-06-17 DIAGNOSIS — Z951 Presence of aortocoronary bypass graft: Secondary | ICD-10-CM | POA: Insufficient documentation

## 2022-06-17 DIAGNOSIS — Z7901 Long term (current) use of anticoagulants: Secondary | ICD-10-CM | POA: Insufficient documentation

## 2022-06-17 DIAGNOSIS — D6869 Other thrombophilia: Secondary | ICD-10-CM

## 2022-06-17 DIAGNOSIS — G8929 Other chronic pain: Secondary | ICD-10-CM | POA: Insufficient documentation

## 2022-06-17 DIAGNOSIS — Z87891 Personal history of nicotine dependence: Secondary | ICD-10-CM | POA: Insufficient documentation

## 2022-06-17 DIAGNOSIS — I480 Paroxysmal atrial fibrillation: Secondary | ICD-10-CM | POA: Diagnosis not present

## 2022-06-17 DIAGNOSIS — E785 Hyperlipidemia, unspecified: Secondary | ICD-10-CM | POA: Diagnosis not present

## 2022-06-17 DIAGNOSIS — J449 Chronic obstructive pulmonary disease, unspecified: Secondary | ICD-10-CM | POA: Diagnosis not present

## 2022-06-17 DIAGNOSIS — M549 Dorsalgia, unspecified: Secondary | ICD-10-CM | POA: Diagnosis not present

## 2022-06-17 LAB — BASIC METABOLIC PANEL
Anion gap: 11 (ref 5–15)
BUN: 62 mg/dL — ABNORMAL HIGH (ref 8–23)
CO2: 27 mmol/L (ref 22–32)
Calcium: 9.5 mg/dL (ref 8.9–10.3)
Chloride: 103 mmol/L (ref 98–111)
Creatinine, Ser: 5.05 mg/dL — ABNORMAL HIGH (ref 0.61–1.24)
GFR, Estimated: 11 mL/min — ABNORMAL LOW (ref >60)
Glucose, Bld: 133 mg/dL — ABNORMAL HIGH (ref 70–99)
Potassium: 4.4 mmol/L (ref 3.5–5.1)
Sodium: 141 mmol/L (ref 135–145)

## 2022-06-17 NOTE — Addendum Note (Signed)
Encounter addended by: Sherran Needs, NP on: 06/17/2022 4:56 PM  Actions taken: Clinical Note Signed

## 2022-06-17 NOTE — Progress Notes (Addendum)
Primary Care Physician: Glean Hess, MD Referring Physician: [facility] admission f/u  Cardiologist: 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 male with a very complicated h/o CAD/CABG in 1996 and 2007, combined CHF/ICM, CKD-4, COPD, DM-2, paroxysmal A-fib on Eliquis, PVCs, renal artery stenosis, HTN, HLD, TAA, morbid obesity and chronic back pain presenting with progressive shortness of breath for about 3 weeks, dry cough, orthopnea, PND, edema and about 6 pounds weight gain in 2 days, and admitted for acute on chronic combined CHF. He was successfully  cardioverted 12/23 and stayed in [name] while  in the hospital.  He was seen by Dr. Lovena Le on consult while hospitalized at [facility]. No changes were made as he was in rhythm. He remains on amiodarone 400 mg qd.     Of note, wife reported that his Lasix was discontinued 3 days prior to admission  due to elevated potassium. BNP 790. Troponin negative x 2. CXR showed diffuse mild interstitial thickening and patchy opacities in the chest suspicious for multifocal infection or asymmetric edema. Patient has no fever or leukocytosis to suggest infection. He was started on IV Lasix. Echocardiogram ordered. Cardiology consulted and following. Nephrology consulted as well.  he was diuresed.  He is now in the afib clinic today  for  hospital f/u. Unfortunately, he in in afib rate controlled. He is feeling better having been diuresed. He was unaware that he was in afib. He states that he hurts all the time before of back and neck problems. He continues on amiodarone 400 mg daily.  Today, he denies symptoms of palpitations, chest pain, shortness of breath, orthopnea, PND, lower extremity edema, dizziness, presyncope, syncope, or neurologic sequela. The patient is tolerating medications without difficulties and is otherwise without complaint today.   Past Medical History:  Diagnosis Date   AAA (abdominal aortic aneurysm) ([place])    a. 3cm by [place] 2015.    Arthritis    "hips; back" (12/13/2014)   CAD (coronary artery disease) 2007   a. s/p CABG- IMA-LAD, VG-Cx, VG-RCA, VG-diag in 1999. B. sp redo CABG- VG-OM, VG-RCA in 2007 due to VG disease. c. NSTEMI 11/2014 s/p DES to SVG-OM from the Y graft.d. PTCA/DES x 1 distal body of SVG to Diagonal.09/2015   Chronic combined systolic and diastolic CHF (congestive heart failure) ([place])    a. remote EF 40-45% in 2006. b. Normal EF 2014. b. Echo 07/2016 EF 45-50%, grade 1 DD. c. Echo 2020 30% to 35%. Diffuse EF 30-35%, diffuse hypokinesis.   Chronic lower back pain    CKD (chronic kidney disease), stage IV (HCC)    COPD (chronic obstructive pulmonary disease) (HCC)    Deafness in left ear    Degenerative disc disease, lumbar    Diabetes mellitus, type 2 ([place]) 10/04/2014   Microalbumin 05/11/2012-100. Foot exam/monofilament 05/11/2012-normal.   Dilated cardiomyopathy ([place]) 10/07/2015   Emphysema    Esophageal stricture 07/02/1998   EGD   Genital candidiasis in male 10/25/2012   GERD (gastroesophageal reflux disease)    History of gout    "last flareup was in 2007" (12/13/2014)   History of hiatal hernia    Hyperlipidemia    Hypertension    Ischemic cardiomyopathy 2006   Echo 2020: EF 30-35%, diffuse hypokinesis   NSTEMI (non-ST elevated myocardial infarction) ([place]) 12/13/2014   PVC's (premature ventricular contractions)    Renal artery stenosis ([place])    a. noted on CT 2008.   Type II diabetes  mellitus ([place])    Diet control    Unstable angina ([place]) 07/27/2017   Walking pneumonia 1990's   Wears dentures    full upper   Past Surgical History:  Procedure Laterality Date   CARDIAC CATHETERIZATION  "several"   CARDIAC CATHETERIZATION N/A 12/13/2014   Procedure: Left Heart Cath and Coronary Angiography;  Surgeon: Jettie Booze, MD;  Location: [place] [facility];  Service: Cardiovascular;  Laterality: N/A;   CARDIAC CATHETERIZATION  12/13/2014   Procedure: Coronary Stent Intervention;  Surgeon:  Jettie Booze, MD;  Location: [place] [facility];  Service: Cardiovascular;;   CARDIAC CATHETERIZATION N/A 10/07/2015   Procedure: Left Heart Cath and Cors/Grafts Angiography;  Surgeon: Burnell Blanks, MD;  Location: [place] [facility];  Service: Cardiovascular;  Laterality: N/A;   CARDIAC CATHETERIZATION N/A 10/07/2015   Procedure: Coronary Stent Intervention;  Surgeon: Burnell Blanks, MD;  Location: [place] [facility];  Service: Cardiovascular;  Laterality: N/A;   CARDIOVERSION N/A 05/25/2022   Procedure: CARDIOVERSION;  Surgeon: Berniece [place]ines, DO;  Location: [place];  Service: Cardiovascular;  Laterality: N/A;   CATARACT EXTRACTION W/PHACO Left 10/21/2021   Procedure: CATARACT EXTRACTION PHACO AND INTRAOCULAR LENS PLACEMENT (IOC) LEFT 3.94 00:33.7;  Surgeon: Eulogio Bear, MD;  Location: [place];  Service: Ophthalmology;  Laterality: Left;   CATARACT EXTRACTION W/PHACO Right 11/09/2021   Procedure: CATARACT EXTRACTION PHACO AND INTRAOCULAR LENS PLACEMENT (IOC) RIGHT;  Surgeon: Eulogio Bear, MD;  Location: [place];  Service: Ophthalmology;  Laterality: Right;  3.59 0:29.4   CORONARY ANGIOPLASTY  "several"   CORONARY ANGIOPLASTY WITH STENT PLACEMENT  2005; 12/13/2014   "2; 1"   CORONARY ARTERY BYPASS GRAFT  1996   CABG X5   CORONARY ARTERY BYPASS GRAFT  March 2007   CABG X3   ESOPHAGOGASTRODUODENOSCOPY (EGD) WITH ESOPHAGEAL DILATION  2000   GREEN LIGHT LASER TURP (TRANSURETHRAL RESECTION OF PROSTATE  2000's   "not cancerous"   HERNIA REPAIR     LAPAROSCOPIC CHOLECYSTECTOMY     LEFT HEART CATH AND CORS/GRAFTS ANGIOGRAPHY N/A 07/28/2017   Procedure: LEFT HEART CATH AND CORS/GRAFTS ANGIOGRAPHY;  Surgeon: Troy Sine, MD;  Location: [place] [facility];  Service: Cardiovascular;  Laterality: N/A;   LUNG SURGERY  1996   "S/P CABG, had to put staple in lung after it had collapsed"   UMBILICAL HERNIA REPAIR     w/chole    Current  Outpatient Medications  Medication Sig Dispense Refill   acetaminophen (TYLENOL) 650 MG CR tablet Take 1,300 mg by mouth at bedtime.     allopurinol (ZYLOPRIM) 100 MG tablet TAKE 1 TABLET BY MOUTH EVERYDAY AT BEDTIME 90 tablet 3   amiodarone (PACERONE) 200 MG tablet Take 400 mg by mouth daily.     apixaban (ELIQUIS) 5 MG TABS tablet Take 1 tablet (5 mg total) by mouth 2 (two) times daily. 180 tablet 1   atorvastatin (LIPITOR) 40 MG tablet Take 1 tablet (40 mg total) by mouth daily. (Patient taking differently: Take 40 mg by mouth at bedtime.) 90 tablet 2   Cholecalciferol (VITAMIN D-3) 125 MCG (5000 UT) TABS Take 2,000 Units by mouth daily.      furosemide (LASIX) 40 MG tablet Take 40 mg by mouth daily in the afternoon.     isosorbide mononitrate (IMDUR) 30 MG 24 hr tablet TAKE 1 TABLET BY MOUTH DAILY (Patient taking differently: Take 30 mg by mouth daily.) 90 tablet 3   levothyroxine (SYNTHROID)  75 MCG tablet Take 1 tablet (75 mcg total) by mouth daily before breakfast. 90 tablet 2   magnesium oxide (MAG-OX) 400 (240 Mg) MG tablet Take 1 tablet (400 mg total) by mouth 2 (two) times daily. 180 tablet 1   metoprolol tartrate (LOPRESSOR) 100 MG tablet TAKE 1 TABLET BY MOUTH TWICE  DAILY (Patient taking differently: Take 100 mg by mouth 2 (two) times daily.) 180 tablet 3   Multiple Vitamin (MULTIVITAMIN PO) Take 1 tablet by mouth daily.     nitroGLYCERIN (NITROSTAT) 0.4 MG SL tablet Place 0.4 mg under the tongue every 5 (five) minutes as needed for chest pain (Up to 3 times).      pantoprazole (PROTONIX) 40 MG tablet TAKE 2 TABLETS BY MOUTH EVERY DAY (Patient taking differently: Take 40 mg by mouth 2 (two) times daily.) 180 tablet 1   polyethylene glycol (MIRALAX / GLYCOLAX) 17 g packet Take 17 g by mouth at bedtime.     sodium bicarbonate 650 MG tablet Take 650 mg by mouth 2 (two) times daily.     loratadine (CLARITIN) 10 MG tablet Take 10 mg by mouth daily. (Patient not taking: Reported on  06/15/2022)     No current facility-administered medications for this encounter.    Allergies  Allergen Reactions   Predicort [Prednisolone] Other (See Comments)    Stomach pain   Ciprofloxacin Other (See Comments)    GI upset   Hydrochlorothiazide Other (See Comments)    Dehydration   Hydrocodone Nausea Only and Other (See Comments)    Stomach upset    Hydrocodone-Acetaminophen Nausea Only    Stomach upset   Sulfa Antibiotics Other (See Comments)    Cannot recall   Hydrocodone-Acetaminophen Nausea Only and Other (See Comments)    Stomach upset   Penicillins Hives, Rash and Other (See Comments)    Has patient had a PCN reaction causing immediate rash, facial/tongue/throat swelling, SOB or lightheadedness with hypotension: YES  Has patient had a PCN reaction causing severe rash involving mucus membranes or skin necrosis: NO  Has patient had a PCN reaction that required hospitalization NO  Has patient had a PCN reaction occurring within the last 10 years:NO  If all of the above answers are "NO", then may proceed with Cephalosporin use.  Has patient had a PCN reaction causing immediate rash, facial/tongue/throat swelling, SOB or lightheadedness with hypotension: YES Has patient had a PCN reaction causing severe rash involving mucus membranes or skin necrosis: NO Has patient had a PCN reaction that required hospitalization NO Has patient had a PCN reaction occurring within the last 10 years:NO If all of the above answers are "NO", then may proceed with Cephalosporin use.    Social History   Socioeconomic History   Marital status: Married    Spouse name: Not on file   Number of children: 2   Years of education: Not on file   Highest education level: 8th grade  Occupational History   Occupation: Retired  Tobacco Use   Smoking status: Former    Packs/day: 3.00    Years: 20.00    Total pack years: 60.00    Types: Cigarettes    Quit date: 07/25/1986    Years since quitting:  35.9   Smokeless tobacco: Never  Vaping Use   Vaping Use: Never used  Substance and Sexual Activity   Alcohol use: No    Alcohol/week: 0.0 standard drinks of alcohol   Drug use: No   Sexual activity: Not Currently  Other  Topics Concern   Not on file  Social History Narrative   Did auto salvage work.   Lives at home with his wife.  Independent at baseline.   Social Determinants of Health   Financial Resource Strain: Low Risk  (10/28/2021)   Overall Financial Resource Strain (CARDIA)    Difficulty of Paying Living Expenses: Not hard at all  Food Insecurity: No Food Insecurity (06/02/2022)   Hunger Vital Sign    Worried About Running Out of Food in the Last Year: Never true    Ran Out of Food in the Last Year: Never true  Transportation Needs: No Transportation Needs (06/02/2022)   PRAPARE - Hydrologist (Medical): No    Lack of Transportation (Non-Medical): No  Physical Activity: Inactive (04/28/2020)   Exercise Vital Sign    Days of Exercise per Week: 0 days    Minutes of Exercise per Session: 0 min  Stress: No Stress Concern Present (10/28/2021)   [place]    Feeling of Stress : Not at all  Social Connections: Moderately Isolated (10/28/2021)   Social Connection and Isolation Panel [NHANES]    Frequency of Communication with Friends and Family: Twice a week    Frequency of Social Gatherings with Friends and Family: Twice a week    Attends Religious Services: Never    Marine scientist or Organizations: No    Attends Archivist Meetings: Never    Marital Status: Married  Human resources officer Violence: Not At Risk (10/28/2021)   Humiliation, Afraid, Rape, and Kick questionnaire    Fear of Current or Ex-Partner: No    Emotionally Abused: No    Physically Abused: No    Sexually Abused: No    Family History  Problem Relation Age of Onset   Heart attack Mother        MI    Stroke Mother    Heart disease Mother    Hypertension Mother    Hyperlipidemia Mother    Asthma Mother    Heart disease Father    Rheumatic fever Father    Colon cancer Neg Hx     ROS- All systems are reviewed and negative except as per the HPI above  Physical Exam: Vitals:   06/17/22 1352  BP: 124/78  Pulse: 95  Weight: 103.3 kg  Height: '5\' 9"'$  (1.753 m)   Wt Readings from Last 3 Encounters:  06/17/22 103.3 kg  06/15/22 103 kg  05/30/22 105.2 kg    Labs: Lab Results  Component Value Date   NA 145 05/31/2022   K 3.9 05/31/2022   CL 108 05/31/2022   CO2 26 05/31/2022   GLUCOSE 104 (H) 05/31/2022   BUN 63 (H) 05/31/2022   CREATININE 4.21 (H) 05/31/2022   CALCIUM 9.3 05/31/2022   PHOS 4.2 05/30/2022   MG 2.7 (H) 05/30/2022   Lab Results  Component Value Date   INR 1.64 06/12/2018   Lab Results  Component Value Date   CHOL 153 11/24/2021   HDL 60 11/24/2021   LDLCALC 82 11/24/2021   TRIG 54 11/24/2021     GEN- The patient is well appearing, alert and oriented x 3 today.   Head- normocephalic, atraumatic Eyes-  Sclera clear, conjunctiva pink Ears- hearing intact Oropharynx- clear Neck- supple, no JVP Lymph- no cervical lymphadenopathy Lungs- Clear to ausculation bilaterally, normal work of breathing Heart- irregular rate and rhythm, no murmurs, rubs or gallops, PMI  not laterally displaced GI- soft, NT, ND, + BS Extremities- no clubbing, cyanosis, or edema MS- no significant deformity or atrophy Skin- no rash or lesion Psych- euthymic mood, full affect Neuro- strength and sensation are intact  EKG-Vent. rate 95 BPM PR interval * ms QRS duration 128 ms QT/QTcB 400/502 ms P-R-T axes * 86 235 Atrial fibrillation Non-specific intra-ventricular conduction block T wave abnormality, consider inferolateral ischemia Abnormal ECG When compared with ECG of 28-May-2022 15:09, PREVIOUS ECG IS PRESENT   1. Left ventricular ejection fraction, by  estimation, is 35 to 40%. The  left ventricle has moderately decreased function. The left ventricle  demonstrates global hypokinesis. The left ventricular internal cavity size  was mildly dilated. Left ventricular  diastolic parameters are consistent with Grade II diastolic dysfunction  (pseudonormalization).   2. Right ventricular systolic function is mildly reduced. The right  ventricular size is normal. There is normal pulmonary artery systolic  pressure. The estimated right ventricular systolic pressure is 78.9 mmHg.   3. The mitral valve is normal in structure. Mild mitral valve  regurgitation. No evidence of mitral stenosis.   4. Tricuspid valve regurgitation is mild to moderate.   5. The aortic valve has an indeterminant number of cusps. There is  moderate calcification of the aortic valve. Aortic valve regurgitation is  not visualized. Aortic valve sclerosis/calcification is present, without  any evidence of aortic stenosis.   6. The inferior vena cava is normal in size with greater than 50%  respiratory variability, suggesting right atrial pressure of 3 mmHg.    Assessment and Plan:  1. Afib Was  successfully cardioverted 12/26  He was then admitted for HF/fluid overload, successfully diuresed  He was seen in consult by Dr. Lovena Le at [facility], 12/30. he was in rhythm, approach unchanged Despite being on amiodarone 400 mg daily, increased 05/19/22, for cardioversion and this dose maintained since then, he is in afib today. I don't think this dose should be continued long term  He does not appear to be an ideal  ablation candidate for co morbidities  His CKD precludes use of tikosyn  Options are limited,  unless schedule  another cardioversion but my expectations are low to maintain SR long term   I will ask Dr. Lovena Le his thoughts on the matter as he just saw on consult   2. CHA2DS2VASc of at least 6 Continue eliquis 5 mg bid  States no missed doses   3. CHF States fluid  status is stable   4. CAD No chest pain   5. CKD Bmet pending   I discussed with Dr. Lovena Le and he did not feel another cardioversion would be effective if he broke  thru 400 mg amiodarone  daily and just had a CV less than one month ago.[name] He suggested to reduce amio to 200 mg daily and he will see pt in the office in 3 weeks. Wife informed.   His bmet also returned with a  worsening creatinine of 5, forwarded to Dr, Candiss Norse, Ronald Price  NP, and PCP   Geroge Baseman. Ronald [name], [facility] [address] [place], [place] [address] [phone]

## 2022-06-21 DIAGNOSIS — N185 Chronic kidney disease, stage 5: Secondary | ICD-10-CM | POA: Diagnosis not present

## 2022-06-21 DIAGNOSIS — R600 Localized edema: Secondary | ICD-10-CM | POA: Diagnosis not present

## 2022-06-21 DIAGNOSIS — I129 Hypertensive chronic kidney disease with stage 1 through stage 4 chronic kidney disease, or unspecified chronic kidney disease: Secondary | ICD-10-CM | POA: Diagnosis not present

## 2022-06-23 ENCOUNTER — Ambulatory Visit: Payer: Medicare Other | Admitting: Internal Medicine

## 2022-06-25 ENCOUNTER — Ambulatory Visit (INDEPENDENT_AMBULATORY_CARE_PROVIDER_SITE_OTHER): Payer: Medicare Other

## 2022-06-25 ENCOUNTER — Telehealth: Payer: Self-pay

## 2022-06-25 DIAGNOSIS — Z0000 Encounter for general adult medical examination without abnormal findings: Secondary | ICD-10-CM | POA: Diagnosis not present

## 2022-06-25 NOTE — Progress Notes (Signed)
I connected with  [facility] on 06/25/22 by a audio enabled telemedicine application and verified that I am speaking with the correct person using two identifiers.  Patient Location: Home  Provider Location: Office/Clinic  I discussed the limitations of evaluation and management by telemedicine. The patient expressed understanding and agreed to proceed.  Subjective:   Ronald [name] is a 77 y.o. male who presents for Medicare Annual/Subsequent preventive examination.  Review of Systems   Per HPI unless specifically indicated below.  Cardiac Risk Factors include: advanced age (>24mn, >>46women);male gender, Essential Hypertension, and CAD.           Objective:       06/17/2022    1:52 PM 06/15/2022    1:12 PM 05/31/2022    5:21 PM  Vitals with BMI  Height '5\' 9"'$     Weight 227 lbs 13 oz 227 lbs   BMI [phone]  Systolic [id number] Diastolic 78 68 64  Pulse 95 96 60    Today's Vitals   06/25/22 1342  PainSc: 7    There is no height or weight on file to calculate BMI.     06/25/2022    1:52 PM 05/28/2022    6:17 PM 05/25/2022    8:18 AM 11/09/2021    8:23 AM 10/28/2021   11:03 AM 04/28/2020   12:13 PM 04/19/2019    2:48 PM  Advanced Directives  Does Patient Have a Medical Advance Directive? No No No No No No No  Would patient like information on creating a medical advance directive? No - Patient declined No - Patient declined No - Patient declined Yes (MAU/Ambulatory/Procedural Areas - Information given) No - Patient declined No - Patient declined No - Patient declined    Current Medications (verified) Outpatient Encounter Medications as of 06/25/2022  Medication Sig   acetaminophen (TYLENOL) 650 MG CR tablet Take 1,300 mg by mouth at bedtime.   allopurinol (ZYLOPRIM) 100 MG tablet TAKE 1 TABLET BY MOUTH EVERYDAY AT BEDTIME   amiodarone (PACERONE) 200 MG tablet Take 200 mg by mouth daily.   apixaban (ELIQUIS) 5 MG TABS tablet Take 1 tablet (5 mg total) by  mouth 2 (two) times daily.   atorvastatin (LIPITOR) 40 MG tablet Take 1 tablet (40 mg total) by mouth daily. (Patient taking differently: Take 40 mg by mouth at bedtime.)   Cholecalciferol (VITAMIN D-3) 125 MCG (5000 UT) TABS Take 2,000 Units by mouth daily.    furosemide (LASIX) 40 MG tablet Take 40 mg by mouth daily in the afternoon.   isosorbide mononitrate (IMDUR) 30 MG 24 hr tablet TAKE 1 TABLET BY MOUTH DAILY (Patient taking differently: Take 30 mg by mouth daily.)   levothyroxine (SYNTHROID) 75 MCG tablet Take 1 tablet (75 mcg total) by mouth daily before breakfast.   loratadine (CLARITIN) 10 MG tablet Take 10 mg by mouth daily as needed.   metoprolol tartrate (LOPRESSOR) 100 MG tablet TAKE 1 TABLET BY MOUTH TWICE  DAILY (Patient taking differently: Take 100 mg by mouth 2 (two) times daily.)   Multiple Vitamin (MULTIVITAMIN PO) Take 1 tablet by mouth daily.   nitroGLYCERIN (NITROSTAT) 0.4 MG SL tablet Place 0.4 mg under the tongue every 5 (five) minutes as needed for chest pain (Up to 3 times).    pantoprazole (PROTONIX) 40 MG tablet TAKE 2 TABLETS BY MOUTH EVERY DAY (Patient taking differently: Take 40 mg by mouth 2 (two) times daily.)   polyethylene glycol (MIRALAX /  GLYCOLAX) 17 g packet Take 17 g by mouth at bedtime.   sodium bicarbonate 650 MG tablet Take 650 mg by mouth 2 (two) times daily.   [DISCONTINUED] magnesium oxide (MAG-OX) 400 (240 Mg) MG tablet Take 1 tablet (400 mg total) by mouth 2 (two) times daily. (Patient not taking: Reported on 06/25/2022)   No facility-administered encounter medications on file as of 06/25/2022.    Allergies (verified) Predicort [prednisolone], Ciprofloxacin, Hydrochlorothiazide, Hydrocodone, Hydrocodone-acetaminophen, Sulfa antibiotics, Hydrocodone-acetaminophen, and Penicillins   History: Past Medical History:  Diagnosis Date   AAA (abdominal aortic aneurysm) ([place])    a. 3cm by [place] 2015.   Arthritis    "hips; back" (12/13/2014)   CAD (coronary  artery disease) 2007   a. s/p CABG- IMA-LAD, VG-Cx, VG-RCA, VG-diag in 1999. B. sp redo CABG- VG-OM, VG-RCA in 2007 due to VG disease. c. NSTEMI 11/2014 s/p DES to SVG-OM from the Y graft.d. PTCA/DES x 1 distal body of SVG to Diagonal.09/2015   Chronic combined systolic and diastolic CHF (congestive heart failure) ([place])    a. remote EF 40-45% in 2006. b. Normal EF 2014. b. Echo 07/2016 EF 45-50%, grade 1 DD. c. Echo 2020 30% to 35%. Diffuse EF 30-35%, diffuse hypokinesis.   Chronic lower back pain    CKD (chronic kidney disease), stage IV (HCC)    COPD (chronic obstructive pulmonary disease) (HCC)    Deafness in left ear    Degenerative disc disease, lumbar    Diabetes mellitus, type 2 ([place]) 10/04/2014   Microalbumin 05/11/2012-100. Foot exam/monofilament 05/11/2012-normal.   Dilated cardiomyopathy ([place]) 10/07/2015   Emphysema    Esophageal stricture 07/02/1998   EGD   Genital candidiasis in male 10/25/2012   GERD (gastroesophageal reflux disease)    History of gout    "last flareup was in 2007" (12/13/2014)   History of hiatal hernia    Hyperlipidemia    Hypertension    Ischemic cardiomyopathy 2006   Echo 2020: EF 30-35%, diffuse hypokinesis   NSTEMI (non-ST elevated myocardial infarction) ([place]) 12/13/2014   PVC's (premature ventricular contractions)    Renal artery stenosis ([place])    a. noted on CT 2008.   Type II diabetes mellitus ([place])    Diet control    Unstable angina ([place]) 07/27/2017   Walking pneumonia 1990's   Wears dentures    full upper   Past Surgical History:  Procedure Laterality Date   CARDIAC CATHETERIZATION  "several"   CARDIAC CATHETERIZATION N/A 12/13/2014   Procedure: Left Heart Cath and Coronary Angiography;  Surgeon: Jettie Booze, MD;  Location: [facility];  Service: Cardiovascular;  Laterality: N/A;   CARDIAC CATHETERIZATION  12/13/2014   Procedure: Coronary Stent Intervention;  Surgeon: Jettie Booze, MD;  Location: [facility];   Service: Cardiovascular;;   CARDIAC CATHETERIZATION N/A 10/07/2015   Procedure: Left Heart Cath and Cors/Grafts Angiography;  Surgeon: Burnell Blanks, MD;  Location: [facility];  Service: Cardiovascular;  Laterality: N/A;   CARDIAC CATHETERIZATION N/A 10/07/2015   Procedure: Coronary Stent Intervention;  Surgeon: Burnell Blanks, MD;  Location: [facility] [facility];  Service: Cardiovascular;  Laterality: N/A;   CARDIOVERSION N/A 05/25/2022   Procedure: CARDIOVERSION;  Surgeon: Berniece Salines, DO;  Location: [facility];  Service: Cardiovascular;  Laterality: N/A;   CATARACT EXTRACTION W/PHACO Left 10/21/2021   Procedure: CATARACT EXTRACTION PHACO AND INTRAOCULAR LENS PLACEMENT (IOC) LEFT 3.94 00:33.7;  Surgeon: Eulogio Bear, MD;  Location: [facility];  Service:  Ophthalmology;  Laterality: Left;   CATARACT EXTRACTION W/PHACO Right 11/09/2021   Procedure: CATARACT EXTRACTION PHACO AND INTRAOCULAR LENS PLACEMENT (IOC) RIGHT;  Surgeon: Eulogio Bear, MD;  Location: [facility];  Service: Ophthalmology;  Laterality: Right;  3.59 0:29.4   CORONARY ANGIOPLASTY  "several"   CORONARY ANGIOPLASTY WITH STENT PLACEMENT  2005; 12/13/2014   "2; 1"   CORONARY ARTERY BYPASS GRAFT  1996   CABG X5   CORONARY ARTERY BYPASS GRAFT  March 2007   CABG X3   ESOPHAGOGASTRODUODENOSCOPY (EGD) WITH ESOPHAGEAL DILATION  2000   GREEN LIGHT LASER TURP (TRANSURETHRAL RESECTION OF PROSTATE  2000's   "not cancerous"   HERNIA REPAIR     LAPAROSCOPIC CHOLECYSTECTOMY     LEFT HEART CATH AND CORS/GRAFTS ANGIOGRAPHY N/A 07/28/2017   Procedure: LEFT HEART CATH AND CORS/GRAFTS ANGIOGRAPHY;  Surgeon: Troy Sine, MD;  Location: [facility];  Service: Cardiovascular;  Laterality: N/A;   LUNG SURGERY  1996   "S/P CABG, had to put staple in lung after it had collapsed"   UMBILICAL HERNIA REPAIR     w/chole   Family History  Problem Relation Age of Onset   Heart attack Mother         MI   Stroke Mother    Heart disease Mother    Hypertension Mother    Hyperlipidemia Mother    Asthma Mother    Heart disease Father    Rheumatic fever Father    Colon cancer Neg Hx    Social History   Socioeconomic History   Marital status: Married    Spouse name: Williom Cedar   Number of children: 2   Years of education: Not on file   Highest education level: 8th grade  Occupational History   Occupation: Retired  Tobacco Use   Smoking status: Former    Packs/day: 3.00    Years: 20.00    Total pack years: 60.00    Types: Cigarettes    Quit date: 07/25/1986    Years since quitting: 35.9   Smokeless tobacco: Never  Vaping Use   Vaping Use: Never used  Substance and Sexual Activity   Alcohol use: No    Alcohol/week: 0.0 standard drinks of alcohol   Drug use: No   Sexual activity: Not Currently  Other Topics Concern   Not on file  Social History Narrative   Did auto salvage work.   Lives at home with his wife.  Independent at baseline.   Social Determinants of Health   Financial Resource Strain: Low Risk  (06/25/2022)   Overall Financial Resource Strain (CARDIA)    Difficulty of Paying Living Expenses: Not hard at all  Food Insecurity: No Food Insecurity (06/25/2022)   Hunger Vital Sign    Worried About Running Out of Food in the Last Year: Never true    Ran Out of Food in the Last Year: Never true  Transportation Needs: No Transportation Needs (06/25/2022)   PRAPARE - Hydrologist (Medical): No    Lack of Transportation (Non-Medical): No  Physical Activity: Inactive (06/25/2022)   Exercise Vital Sign    Days of Exercise per Week: 0 days    Minutes of Exercise per Session: 0 min  Stress: No Stress Concern Present (06/25/2022)   [place]    Feeling of Stress : Not at all  Social Connections: Moderately Isolated (06/25/2022)   Social Connection and Isolation Panel  [  NHANES]    Frequency of Communication with Friends and Family: Twice a week    Frequency of Social Gatherings with Friends and Family: Once a week    Attends Religious Services: Never    Marine scientist or Organizations: No    Attends Music therapist: Never    Marital Status: Married    Tobacco Counseling Counseling given: Not Answered   Clinical Intake:  Pre-visit preparation completed: No  Pain : 0-10 Pain Score: 7  Pain Type: Chronic pain Pain Location: Hip     Nutritional Status: BMI 25 -29 Overweight Nutritional Risks: None  How often do you need to have someone help you when you read instructions, pamphlets, or other written materials from your doctor or pharmacy?: 1 - Never  Diabetic?Nutrition Risk Assessment:  Has the patient had any N/V/D within the last 2 months?  Yes  Does the patient have any non-healing wounds?  No  Has the patient had any unintentional weight loss or weight gain?  No   Diabetes:  Is the patient diabetic?  Yes  If diabetic, was a CBG obtained today?  No  Did the patient bring in their glucometer from home?  Yes  How often do you monitor your CBG's? Occasionally .   Financial Strains and Diabetes Management:  Are you having any financial strains with the device, your supplies or your medication? No .  Does the patient want to be seen by Chronic Care Management for management of their diabetes?  No  Would the patient like to be referred to a Nutritionist or for Diabetic Management?  No   Diabetic Exams:  Diabetic Eye Exam: Completed 10/05/2021 Diabetic Foot Exam: Overdue, Pt has been advised about the importance in completing this exam. Pt is scheduled for diabetic foot exam on 06/28/2021.   Interpreter Needed?: No  Information entered by :: Donnie [place], [place]   Activities of Daily Living    06/25/2022    1:38 PM 10/28/2021   11:04 AM  In your present state of health, do you have any difficulty performing  the following activities:  Hearing? 1 1  Comment Deaf Lt hearing   Vision? 1 0  Comment wear glasses, vision improved since cataract surgery   Difficulty concentrating or making decisions? 1 0  Walking or climbing stairs? 1 1  Dressing or bathing? 0 0  Doing errands, shopping? 1 0  Preparing Food and eating ?  N  Using the Toilet?  N  In the past six months, have you accidently leaked urine?  N  Do you have problems with loss of bowel control?  N  Managing your Medications?  Y  Managing your Finances?  Y  Housekeeping or managing your Housekeeping?  Y    Patient Care Team: Glean Hess, MD as PCP - General (Internal Medicine) Burnell Blanks, MD as PCP - Cardiology (Cardiology) Murlean Iba, MD as Consulting Physician (Nephrology) Eulogio Bear, MD as Consulting Physician (Ophthalmology) Margaretha Sheffield, MD (Otolaryngology) Germaine Pomfret, [facility] (Pharmacist)  Indicate any recent Medical Services you may have received from other than [facility] providers in the past year (date may be approximate).    The pt was seen at [facility] on 06/04/2022 for his Chronic kidney disease stage 4.  Assessment:   This is a routine wellness examination for [place].  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 Declined any issues with his vision post cataract surgery. Annual Eye Exam:  [facility]  Dietary issues and exercise activities discussed: Current Exercise Habits: The patient does not participate in regular exercise at present, Exercise limited by: orthopedic condition(s)   Goals Addressed   None    Depression Screen    06/25/2022    1:37 PM 04/06/2022    3:38 PM 10/28/2021   11:01 AM 11/03/2020    2:47 PM 04/28/2020   12:10 PM 03/19/2020    3:04 PM 04/19/2019    2:48 PM  PHQ 2/9 Scores  PHQ - 2 Score 1 3 0 0 0 0 0  PHQ- 9 Score  '16  6  9     '$ Fall Risk    06/25/2022    1:37 PM 04/06/2022    3:39 PM 10/28/2021    11:03 AM 07/03/2021   10:59 AM 11/03/2020    2:46 PM  [place] in the past year? '1 1 1 1 '$ 0  Number falls in past yr: '1 1 1 1 '$ 0  Injury with Fall? 1 1 0 0 0  Risk for fall due to : Impaired balance/gait;Impaired mobility No Fall Risks History of fall(s)  Orthopedic patient  Follow up Falls evaluation completed  Falls prevention discussed  Falls evaluation completed    FALL RISK PREVENTION PERTAINING TO THE HOME:  Any stairs in or around the home? Yes  If so, are there any without handrails? No  Home free of loose throw rugs in walkways, pet beds, electrical cords, etc? Yes  Adequate lighting in your home to reduce risk of falls? Yes   ASSISTIVE DEVICES UTILIZED TO PREVENT FALLS:  Life alert? No  Use of a cane, walker or w/c? No  Grab bars in the bathroom? No  Shower chair or bench in shower? No  Elevated toilet seat or a handicapped toilet? No   TIMED UP AND GO:  Was the test performed?  unable to perform .  Cognitive Function:        06/25/2022    1:40 PM 04/06/2022    3:33 PM 04/17/2018    3:05 PM 04/16/2016    3:06 PM  6CIT Screen  What Year? 0 points 0 points 0 points 0 points  What month? 0 points 0 points 0 points 0 points  What time? 0 points 0 points 0 points 0 points  Count back from 20 0 points 0 points 0 points 0 points  Months in reverse 0 points 4 points 4 points 4 points  Repeat phrase 2 points 2 points 2 points 10 points  Total Score 2 points 6 points 6 points 14 points    Immunizations Immunization History  Administered Date(s) Administered   Fluad Quad(high Dose 65+) 03/01/2019, 03/19/2020, 02/25/2021, 04/06/2022   Influenza, High Dose Seasonal PF 03/18/2015, 03/02/2016, 03/15/2017, 02/13/2018   Influenza-Unspecified 03/14/2017   Moderna Sars-Covid-2 Vaccination 07/28/2019, 08/25/2019   Pneumococcal Conjugate-13 12/25/2013   Pneumococcal Polysaccharide-23 03/04/2009   Td 10/10/2003, 04/16/2016   Tdap 01/22/2022    TDAP status: Up to  date  Flu Vaccine status: Up to date  Pneumococcal vaccine status: Up to date  Covid-19 vaccine status: Information provided on how to obtain vaccines.   Qualifies for Shingles Vaccine? Yes   Zostavax completed No   Shingrix Completed?: No.    Education has been provided regarding the importance of this vaccine. Patient has been advised to call insurance company to determine out of pocket expense if they have not yet received this vaccine. Advised may also receive vaccine  at local pharmacy or Health Dept. Verbalized acceptance and understanding.  Screening Tests Health Maintenance  Topic Date Due   Zoster Vaccines- Shingrix (1 of 2) Never done   FOOT EXAM  01/11/2018   COVID-19 Vaccine (3 - Moderna risk series) 09/22/2019   Diabetic kidney evaluation - Urine ACR  04/29/2020   OPHTHALMOLOGY EXAM  10/06/2022   HEMOGLOBIN A1C  11/28/2022   Diabetic kidney evaluation - eGFR measurement  06/18/2023   Medicare Annual Wellness (AWV)  06/26/2023   DTaP/Tdap/Td (4 - Td or Tdap) 01/23/2032   Pneumonia Vaccine 33+ Years old  Completed   INFLUENZA VACCINE  Completed   Hepatitis C Screening  Completed   HPV VACCINES  Aged Out   COLONOSCOPY (Pts 45-24yr Insurance coverage will need to be confirmed)  Discontinued    Health Maintenance  Health Maintenance Due  Topic Date Due   Zoster Vaccines- Shingrix (1 of 2) Never done   FOOT EXAM  01/11/2018   COVID-19 Vaccine (3 - Moderna risk series) 09/22/2019   Diabetic kidney evaluation - Urine ACR  04/29/2020    Colorectal cancer screening: Type of screening: Colonoscopy. Completed 11/18/2016. Repeat every 10 years  Lung Cancer Screening: (Low Dose CT Chest recommended if Age 79-80years, 30 pack-year currently smoking OR have quit w/in 15years.) does not qualify.   Lung Cancer Screening Referral: not applicable    Additional Screening:  Hepatitis C Screening: does qualify; Completed 01/13/2017   Vision Screening: Recommended annual  ophthalmology exams for early detection of glaucoma and other disorders of the eye. Is the patient up to date with their annual eye exam?  Yes  Who is the provider or what is the name of the office in which the patient attends annual eye exams? [facility] If pt is not established with a provider, would they like to be referred to a provider to establish care? No .   Dental Screening: Recommended annual dental exams for proper oral hygiene  Community Resource Referral / Chronic Care Management: CRR required this visit?  No   CCM required this visit?  No      Plan:     I have personally reviewed and noted the following in the patient's chart:   Medical and social history Use of alcohol, tobacco or illicit drugs  Current medications and supplements including opioid prescriptions. Patient is not currently taking opioid prescriptions. Functional ability and status Nutritional status Physical activity Advanced directives List of other physicians Hospitalizations, surgeries, and ER visits in previous 12 months Vitals Screenings to include cognitive, depression, and falls Referrals and appointments  In addition, I have reviewed and discussed with patient certain preventive protocols, quality metrics, and best practice recommendations. A written personalized care plan for preventive services as well as general preventive health recommendations were provided to patient.    Mr. DTinnell, Thank you for taking time to come for your Medicare Wellness Visit. I appreciate your ongoing commitment to your health goals. Please review the following plan we discussed and let me know if I can assist you in the future.   These are the goals we discussed:  Goals      Cut out extra servings     Recommend to cut out all snacks and sweets after dinner to help aid in weight loss.      DIET - EAT MORE FRUITS AND VEGETABLES     Increase water intake     Recommend to increase water intake to  6-8  glasses a day.     Track and Manage My Blood Pressure-Hypertension     Timeframe:  Long-Range Goal Priority:  High Start Date: 12/11/2020                             Expected End Date:  12/12/2022                      Follow Up within 90 days   - check blood pressure 3 times per week    Why is this important?   You won't feel high blood pressure, but it can still hurt your blood vessels.  High blood pressure can cause heart or kidney problems. It can also cause a stroke.  Making lifestyle changes like losing a little weight or eating less salt will help.  Checking your blood pressure at home and at different times of the day can help to control blood pressure.  If the doctor prescribes medicine remember to take it the way the doctor ordered.  Call the office if you cannot afford the medicine or if there are questions about it.     Notes:         This is a list of the screening recommended for you and due dates:  Health Maintenance  Topic Date Due   Zoster (Shingles) Vaccine (1 of 2) Never done   Complete foot exam   01/11/2018   COVID-19 Vaccine (3 - Moderna risk series) 09/22/2019   Yearly kidney health urinalysis for diabetes  04/29/2020   Eye exam for diabetics  10/06/2022   Hemoglobin A1C  11/28/2022   Yearly kidney function blood test for diabetes  06/18/2023   Medicare Annual Wellness Visit  06/26/2023   DTaP/Tdap/Td vaccine (4 - Td or Tdap) 01/23/2032   Pneumonia Vaccine  Completed   Flu Shot  Completed   Hepatitis C Screening: USPSTF Recommendation to screen - Ages 18-79 yo.  Completed   HPV Vaccine  Aged Out   Colon Cancer Screening  Discontinued      Wilson Singer, [facility]   06/25/2022   Nurse Notes: Approximately 30 minute Non-Face -To-Face Medicare Wellness Visit.    Ronald [name] complains of night sweats, nausea, and intermittent mid-chest pressure x 2 weeks. The pt states, " he just has not felt well over the past two weeks." I recommended that the patient  go and be evaluated at the Emergency/Urgent Care. The pt declined, " stating that the chest pressure is not frequent or painful." He said the last time he felt the pressure was this morning. He did state if his symptoms get worse, he will go to the ER. I schedule an appointment for Monday, January 29th, at the patient's request.

## 2022-06-25 NOTE — Telephone Encounter (Signed)
Ronald [name] was on my schedule today for his AWV. He complains of night sweats, nausea, and intermittent mid-chest pressure x 2 weeks. The pt states, " he just has not felt well over the past two weeks." I recommended that the patient go and be evaluated at the Emergency/Urgent Care. The pt declined, " stating that the chest pressure is not frequent or painful." He said the last time he felt the pressure was this morning. He did state if his symptoms get worse, he will go to the ER. I schedule an appointment for Monday, January 29th, at the patient's request.

## 2022-06-25 NOTE — Patient Instructions (Signed)
Health Maintenance, Male Adopting a healthy lifestyle and getting preventive care are important in promoting health and wellness. Ask your health care provider about: The right schedule for you to have regular tests and exams. Things you can do on your own to prevent diseases and keep yourself healthy. What should I know about diet, weight, and exercise? Eat a healthy diet  Eat a diet that includes plenty of vegetables, fruits, low-fat dairy products, and lean protein. Do not eat a lot of foods that are high in solid fats, added sugars, or sodium. Maintain a healthy weight Body mass index (BMI) is a measurement that can be used to identify possible weight problems. It estimates body fat based on height and weight. Your health care provider can help determine your BMI and help you achieve or maintain a healthy weight. Get regular exercise Get regular exercise. This is one of the most important things you can do for your health. Most adults should: Exercise for at least 150 minutes each week. The exercise should increase your heart rate and make you sweat (moderate-intensity exercise). Do strengthening exercises at least twice a week. This is in addition to the moderate-intensity exercise. Spend less time sitting. Even light physical activity can be beneficial. Watch cholesterol and blood lipids Have your blood tested for lipids and cholesterol at 79 years of age, then have this test every 5 years. You may need to have your cholesterol levels checked more often if: Your lipid or cholesterol levels are high. You are older than 79 years of age. You are at high risk for heart disease. What should I know about cancer screening? Many types of cancers can be detected early and may often be prevented. Depending on your health history and family history, you may need to have cancer screening at various ages. This may include screening for: Colorectal cancer. Prostate cancer. Skin cancer. Lung  cancer. What should I know about heart disease, diabetes, and high blood pressure? Blood pressure and heart disease High blood pressure causes heart disease and increases the risk of stroke. This is more likely to develop in people who have high blood pressure readings or are overweight. Talk with your health care provider about your target blood pressure readings. Have your blood pressure checked: Every 3-5 years if you are 18-39 years of age. Every year if you are 40 years old or older. If you are between the ages of 65 and 75 and are a current or former smoker, ask your health care provider if you should have a one-time screening for abdominal aortic aneurysm (AAA). Diabetes Have regular diabetes screenings. This checks your fasting blood sugar level. Have the screening done: Once every three years after age 45 if you are at a normal weight and have a low risk for diabetes. More often and at a younger age if you are overweight or have a high risk for diabetes. What should I know about preventing infection? Hepatitis B If you have a higher risk for hepatitis B, you should be screened for this virus. Talk with your health care provider to find out if you are at risk for hepatitis B infection. Hepatitis C Blood testing is recommended for: Everyone born from 1945 through 1965. Anyone with known risk factors for hepatitis C. Sexually transmitted infections (STIs) You should be screened each year for STIs, including gonorrhea and chlamydia, if: You are sexually active and are younger than 79 years of age. You are older than 79 years of age and your   health care provider tells you that you are at risk for this type of infection. Your sexual activity has changed since you were last screened, and you are at increased risk for chlamydia or gonorrhea. Ask your health care provider if you are at risk. Ask your health care provider about whether you are at high risk for HIV. Your health care provider  may recommend a prescription medicine to help prevent HIV infection. If you choose to take medicine to prevent HIV, you should first get tested for HIV. You should then be tested every 3 months for as long as you are taking the medicine. Follow these instructions at home: Alcohol use Do not drink alcohol if your health care provider tells you not to drink. If you drink alcohol: Limit how much you have to 0-2 drinks a day. Know how much alcohol is in your drink. In the [place] one drink equals one 12 oz bottle of beer (355 mL), one 5 oz glass of wine (148 mL), or one 1 oz glass of hard liquor (44 mL). Lifestyle Do not use any products that contain nicotine or tobacco. These products include cigarettes, chewing tobacco, and vaping devices, such as e-cigarettes. If you need help quitting, ask your health care provider. Do not use street drugs. Do not share needles. Ask your health care provider for help if you need support or information about quitting drugs. General instructions Schedule regular health, dental, and eye exams. Stay current with your vaccines. Tell your health care provider if: You often feel depressed. You have ever been abused or do not feel safe at home. Summary Adopting a healthy lifestyle and getting preventive care are important in promoting health and wellness. Follow your health care provider's instructions about healthy diet, exercising, and getting tested or screened for diseases. Follow your health care provider's instructions on monitoring your cholesterol and blood pressure. This information is not intended to replace advice given to you by your health care provider. Make sure you discuss any questions you have with your health care provider. Document Revised: 10/06/2020 Document Reviewed: 10/06/2020 Elsevier Patient Education  2023 Elsevier Inc.  

## 2022-06-25 NOTE — Telephone Encounter (Signed)
I I spoke with the patient's wife and notified her of your recommendation. Mrs. Riordan said she would give the cardiologist a call to see if he could be seen sooner. Appointment canceled for Monday.

## 2022-06-28 ENCOUNTER — Ambulatory Visit: Payer: Medicare Other | Admitting: Internal Medicine

## 2022-07-07 ENCOUNTER — Ambulatory Visit: Payer: Medicare Other | Attending: Cardiovascular Disease | Admitting: Cardiovascular Disease

## 2022-07-07 ENCOUNTER — Encounter: Payer: Self-pay | Admitting: Cardiovascular Disease

## 2022-07-07 VITALS — BP 128/78 | HR 99 | Ht 69.0 in | Wt 231.0 lb

## 2022-07-07 DIAGNOSIS — E7800 Pure hypercholesterolemia, unspecified: Secondary | ICD-10-CM

## 2022-07-07 DIAGNOSIS — I10 Essential (primary) hypertension: Secondary | ICD-10-CM | POA: Diagnosis not present

## 2022-07-07 DIAGNOSIS — N184 Chronic kidney disease, stage 4 (severe): Secondary | ICD-10-CM | POA: Diagnosis not present

## 2022-07-07 DIAGNOSIS — I255 Ischemic cardiomyopathy: Secondary | ICD-10-CM | POA: Diagnosis not present

## 2022-07-07 DIAGNOSIS — I5020 Unspecified systolic (congestive) heart failure: Secondary | ICD-10-CM | POA: Diagnosis not present

## 2022-07-07 DIAGNOSIS — I480 Paroxysmal atrial fibrillation: Secondary | ICD-10-CM | POA: Diagnosis not present

## 2022-07-07 DIAGNOSIS — I2510 Atherosclerotic heart disease of native coronary artery without angina pectoris: Secondary | ICD-10-CM

## 2022-07-07 NOTE — Progress Notes (Signed)
Chief Complaint  Patient presents with   Follow-up    CAD   History of Present Illness: 77 yo male with history of CAD s/p CABG in 1996 (LIMA-LAD, SVG-circumflex, SVG-RCA, SVG-diagonal) with redo bypass in 2007 (SVG-OM, SVG-RCA), ischemic cardiomyopathy, CKD, DM 2, HTN, HLD, AAA, renal artery stenosis, COPD, GERD, atrial fibrillation and PVCs here today for cardiac follow up. He was hospitalized at [facility] July 2016 with a NSTEMI. Cardiac cath with severe disease in the SVG to OM treated with a drug eluting stent. He was treated with Brilinta post PCI but developed dyspnea and was changed to Plavix. He was admitted to [facility] May 2017 with unstable angina and cardiac cath showed severe disease in the distal body/anastomosis of the SVG to Diagonal.  A drug eluting stent was placed in this vein graft. The LIMA to LAD was patent, SVG to OM was patent, SVG to PDA/PLA was patent. He was admitted to [facility] February 2018 with chest pain. Troponin was negative x 3. He did not have an ischemic evaluation. Echo February 2018 with  mild LV systolic dysfunction. LVEF=45-50%. I saw him in the office in January 2019 and he was volume overloaded. Lasix was started. He was admitted to [facility] February 2019 with unstable angina and cardiac cath showed patency of all of the bypass grafts. Echo February 2019 with normal LV systolic function, ZOXW=96-04%, grade 1 diastolic dysfunction. He was admitted to [facility] January 2020 with atrial fib/flutter with RVR. Started on Xarelto and initially rate controlled with IV Cardizem. He was loaded with IV amiodarone and discharged home on po amiodarone and Lopressor (Coreg stopped during admission). His Plavix and Norvasc were stopped at discharge. His Norvasc was restarted in primary care. Echo January 2020 with LVEF=30-35%. Mild MR. Abdominal u/s April 2022 with 3.1 cm AAA, stable. He was seen in December 2023 and was in atrial flutter. Amiodarone was increased but he did not convert to sinus. He  was cardioverted on 12/07/06/21. He was admitted to [facility] on 05/29/22 with dyspnea, chest pain and LE edema. Echo 05/29/22 with LVEF=35-40% with global hypokinesis-unchanged. Mild MR. Aortic valve sclerosis. He was diuresed. His irbesartan was stopped. He was seen by Dr. Lovena Le while admitted. He was seen in the atrial fib clinic on 06/17/22 and was back in atrial fib. His amiodarone was lowered to 200 mg daily. Appt made with Dr. Lovena Le for next week to review options for further medical therapy as he is not felt to be a good candidate for ablation.    He is here today for follow up. The patient denies any chest pain, dyspnea, palpitations, lower extremity edema, orthopnea, PND, dizziness, near syncope or syncope. He is mostly limited by back pain. He also has acid reflux that wakens him at night. This is described as  foul taste in his mouth and frequent pain in his abdomen. No LE edema. Weight stable at home.   Primary Care Physician: Glean Hess, MD  Past Medical History:  Diagnosis Date   AAA (abdominal aortic aneurysm) ([place])    a. 3cm by [place] 2015.   Arthritis    "hips; back" (12/13/2014)   CAD (coronary artery disease) 2007   a. s/p CABG- IMA-LAD, VG-Cx, VG-RCA, VG-diag in 1999. B. sp redo CABG- VG-OM, VG-RCA in 2007 due to VG disease. c. NSTEMI 11/2014 s/p DES to SVG-OM from the Y graft.d. PTCA/DES x 1 distal body of SVG to Diagonal.09/2015   Chronic combined systolic and diastolic CHF (congestive heart failure) (  [place])    a. remote EF 40-45% in 2006. b. Normal EF 2014. b. Echo 07/2016 EF 45-50%, grade 1 DD. c. Echo 2020 30% to 35%. Diffuse EF 30-35%, diffuse hypokinesis.   Chronic lower back pain    CKD (chronic kidney disease), stage IV (HCC)    COPD (chronic obstructive pulmonary disease) (HCC)    Deafness in left ear    Degenerative disc disease, lumbar    Diabetes mellitus, type 2 ([place]) 10/04/2014   Microalbumin 05/11/2012-100. Foot exam/monofilament 05/11/2012-normal.   Dilated  cardiomyopathy ([place]) 10/07/2015   Emphysema    Esophageal stricture 07/02/1998   EGD   Genital candidiasis in male 10/25/2012   GERD (gastroesophageal reflux disease)    History of gout    "last flareup was in 2007" (12/13/2014)   History of hiatal hernia    Hyperlipidemia    Hypertension    Ischemic cardiomyopathy 2006   Echo 2020: EF 30-35%, diffuse hypokinesis   NSTEMI (non-ST elevated myocardial infarction) ([place]) 12/13/2014   PVC's (premature ventricular contractions)    Renal artery stenosis ([place])    a. noted on CT 2008.   Type II diabetes mellitus ([place])    Diet control    Unstable angina ([place]) 07/27/2017   Walking pneumonia 1990's   Wears dentures    full upper    Past Surgical History:  Procedure Laterality Date   CARDIAC CATHETERIZATION  "several"   CARDIAC CATHETERIZATION N/A 12/13/2014   Procedure: Left Heart Cath and Coronary Angiography;  Surgeon: Jettie Booze, MD;  Location: [place] [facility];  Service: Cardiovascular;  Laterality: N/A;   CARDIAC CATHETERIZATION  12/13/2014   Procedure: Coronary Stent Intervention;  Surgeon: Jettie Booze, MD;  Location: [place] [facility];  Service: Cardiovascular;;   CARDIAC CATHETERIZATION N/A 10/07/2015   Procedure: Left Heart Cath and Cors/Grafts Angiography;  Surgeon: Burnell Blanks, MD;  Location: [place] [facility];  Service: Cardiovascular;  Laterality: N/A;   CARDIAC CATHETERIZATION N/A 10/07/2015   Procedure: Coronary Stent Intervention;  Surgeon: Burnell Blanks, MD;  Location: [place] [facility];  Service: Cardiovascular;  Laterality: N/A;   CARDIOVERSION N/A 05/25/2022   Procedure: CARDIOVERSION;  Surgeon: Berniece Salines, DO;  Location: [place];  Service: Cardiovascular;  Laterality: N/A;   CATARACT EXTRACTION W/PHACO Left 10/21/2021   Procedure: CATARACT EXTRACTION PHACO AND INTRAOCULAR LENS PLACEMENT (IOC) LEFT 3.94 00:33.7;  Surgeon: Eulogio Bear, MD;  Location: [place];   Service: Ophthalmology;  Laterality: Left;   CATARACT EXTRACTION W/PHACO Right 11/09/2021   Procedure: CATARACT EXTRACTION PHACO AND INTRAOCULAR LENS PLACEMENT (IOC) RIGHT;  Surgeon: Eulogio Bear, MD;  Location: [place];  Service: Ophthalmology;  Laterality: Right;  3.59 0:29.4   CORONARY ANGIOPLASTY  "several"   CORONARY ANGIOPLASTY WITH STENT PLACEMENT  2005; 12/13/2014   "2; 1"   CORONARY ARTERY BYPASS GRAFT  1996   CABG X5   CORONARY ARTERY BYPASS GRAFT  March 2007   CABG X3   ESOPHAGOGASTRODUODENOSCOPY (EGD) WITH ESOPHAGEAL DILATION  2000   GREEN LIGHT LASER TURP (TRANSURETHRAL RESECTION OF PROSTATE  2000's   "not cancerous"   HERNIA REPAIR     LAPAROSCOPIC CHOLECYSTECTOMY     LEFT HEART CATH AND CORS/GRAFTS ANGIOGRAPHY N/A 07/28/2017   Procedure: LEFT HEART CATH AND CORS/GRAFTS ANGIOGRAPHY;  Surgeon: Troy Sine, MD;  Location: [place] [facility];  Service: Cardiovascular;  Laterality: N/A;   LUNG SURGERY  1996   "S/P CABG, had to put staple  in lung after it had collapsed"   UMBILICAL HERNIA REPAIR     w/chole    Current Outpatient Medications  Medication Sig Dispense Refill   acetaminophen (TYLENOL) 650 MG CR tablet Take 1,300 mg by mouth at bedtime.     allopurinol (ZYLOPRIM) 100 MG tablet TAKE 1 TABLET BY MOUTH EVERYDAY AT BEDTIME 90 tablet 3   amiodarone (PACERONE) 200 MG tablet Take 200 mg by mouth daily.     apixaban (ELIQUIS) 5 MG TABS tablet Take 1 tablet (5 mg total) by mouth 2 (two) times daily. 180 tablet 1   atorvastatin (LIPITOR) 40 MG tablet Take 1 tablet (40 mg total) by mouth daily. (Patient taking differently: Take 40 mg by mouth at bedtime.) 90 tablet 2   Cholecalciferol (VITAMIN D-3) 125 MCG (5000 UT) TABS Take 2,000 Units by mouth daily.      furosemide (LASIX) 40 MG tablet Take 40 mg by mouth daily in the afternoon.     isosorbide mononitrate (IMDUR) 30 MG 24 hr tablet TAKE 1 TABLET BY MOUTH DAILY (Patient taking differently: Take 30 mg  by mouth daily.) 90 tablet 3   levothyroxine (SYNTHROID) 75 MCG tablet Take 1 tablet (75 mcg total) by mouth daily before breakfast. 90 tablet 2   loratadine (CLARITIN) 10 MG tablet Take 10 mg by mouth daily as needed.     metoprolol tartrate (LOPRESSOR) 100 MG tablet TAKE 1 TABLET BY MOUTH TWICE  DAILY (Patient taking differently: Take 100 mg by mouth 2 (two) times daily.) 180 tablet 3   Multiple Vitamin (MULTIVITAMIN PO) Take 1 tablet by mouth daily.     nitroGLYCERIN (NITROSTAT) 0.4 MG SL tablet Place 0.4 mg under the tongue every 5 (five) minutes as needed for chest pain (Up to 3 times).      pantoprazole (PROTONIX) 40 MG tablet TAKE 2 TABLETS BY MOUTH EVERY DAY (Patient taking differently: Take 40 mg by mouth 2 (two) times daily.) 180 tablet 1   polyethylene glycol (MIRALAX / GLYCOLAX) 17 g packet Take 17 g by mouth at bedtime.     sodium bicarbonate 650 MG tablet Take 650 mg by mouth 2 (two) times daily.     No current facility-administered medications for this visit.    Allergies  Allergen Reactions   Predicort [Prednisolone] Other (See Comments)    Stomach pain   Ciprofloxacin Other (See Comments)    GI upset   Hydrochlorothiazide Other (See Comments)    Dehydration   Hydrocodone Nausea Only and Other (See Comments)    Stomach upset    Hydrocodone-Acetaminophen Nausea Only    Stomach upset   Sulfa Antibiotics Other (See Comments)    Cannot recall   Hydrocodone-Acetaminophen Nausea Only and Other (See Comments)    Stomach upset   Penicillins Hives, Rash and Other (See Comments)    Has patient had a PCN reaction causing immediate rash, facial/tongue/throat swelling, SOB or lightheadedness with hypotension: YES  Has patient had a PCN reaction causing severe rash involving mucus membranes or skin necrosis: NO  Has patient had a PCN reaction that required hospitalization NO  Has patient had a PCN reaction occurring within the last 10 years:NO  If all of the above answers are  "NO", then may proceed with Cephalosporin use.  Has patient had a PCN reaction causing immediate rash, facial/tongue/throat swelling, SOB or lightheadedness with hypotension: YES Has patient had a PCN reaction causing severe rash involving mucus membranes or skin necrosis: NO Has patient had a PCN reaction  that required hospitalization NO Has patient had a PCN reaction occurring within the last 10 years:NO If all of the above answers are "NO", then may proceed with Cephalosporin use.    Social History   Socioeconomic History   Marital status: Married    Spouse name: Corydon Schweiss   Number of children: 2   Years of education: Not on file   Highest education level: 8th grade  Occupational History   Occupation: Retired  Tobacco Use   Smoking status: Former    Packs/day: 3.00    Years: 20.00    Total pack years: 60.00    Types: Cigarettes    Quit date: 07/25/1986    Years since quitting: 35.9   Smokeless tobacco: Never  Vaping Use   Vaping Use: Never used  Substance and Sexual Activity   Alcohol use: No    Alcohol/week: 0.0 standard drinks of alcohol   Drug use: No   Sexual activity: Not Currently  Other Topics Concern   Not on file  Social History Narrative   Did auto salvage work.   Lives at home with his wife.  Independent at baseline.   Social Determinants of Health   Financial Resource Strain: Low Risk  (06/25/2022)   Overall Financial Resource Strain (CARDIA)    Difficulty of Paying Living Expenses: Not hard at all  Food Insecurity: No Food Insecurity (06/25/2022)   Hunger Vital Sign    Worried About Running Out of Food in the Last Year: Never true    Ran Out of Food in the Last Year: Never true  Transportation Needs: No Transportation Needs (06/25/2022)   PRAPARE - Hydrologist (Medical): No    Lack of Transportation (Non-Medical): No  Physical Activity: Inactive (06/25/2022)   Exercise Vital Sign    Days of Exercise per Week: 0 days     Minutes of Exercise per Session: 0 min  Stress: No Stress Concern Present (06/25/2022)   [place]    Feeling of Stress : Not at all  Social Connections: Moderately Isolated (06/25/2022)   Social Connection and Isolation Panel [NHANES]    Frequency of Communication with Friends and Family: Twice a week    Frequency of Social Gatherings with Friends and Family: Once a week    Attends Religious Services: Never    Marine scientist or Organizations: No    Attends Archivist Meetings: Never    Marital Status: Married  Human resources officer Violence: Not At Risk (06/25/2022)   Humiliation, Afraid, Rape, and Kick questionnaire    Fear of Current or Ex-Partner: No    Emotionally Abused: No    Physically Abused: No    Sexually Abused: No    Family History  Problem Relation Age of Onset   Heart attack Mother        MI   Stroke Mother    Heart disease Mother    Hypertension Mother    Hyperlipidemia Mother    Asthma Mother    Heart disease Father    Rheumatic fever Father    Colon cancer Neg Hx     Review of Systems:  As stated in the HPI and otherwise negative.   BP 128/78   Pulse 99   Ht '5\' 9"'$  (1.753 m)   Wt 104.8 kg   SpO2 96%   BMI 34.11 kg/m   Physical Examination: General: Well developed, well nourished, NAD  HEENT: OP  clear, mucus membranes moist  SKIN: warm, dry. No rashes. Neuro: No focal deficits  Musculoskeletal: Muscle strength 5/5 all ext  Psychiatric: Mood and affect normal  Neck: No JVD, no carotid bruits, no thyromegaly, no lymphadenopathy.  Lungs:Clear bilaterally, no wheezes, rhonci, crackles Cardiovascular: Regular, tachy. No murmurs, gallops or rubs. Abdomen:Soft. Bowel sounds present. Non-tender.  Extremities: No lower extremity edema. Pulses are 2 + in the bilateral DP/PT.  Echo December 2023: 1. Left ventricular ejection fraction, by estimation, is 35 to 40%. The  left  ventricle has moderately decreased function. The left ventricle  demonstrates global hypokinesis. The left ventricular internal cavity size  was mildly dilated. Left ventricular  diastolic parameters are consistent with Grade II diastolic dysfunction  (pseudonormalization).   2. Right ventricular systolic function is mildly reduced. The right  ventricular size is normal. There is normal pulmonary artery systolic  pressure. The estimated right ventricular systolic pressure is 53.6 mmHg.   3. The mitral valve is normal in structure. Mild mitral valve  regurgitation. No evidence of mitral stenosis.   4. Tricuspid valve regurgitation is mild to moderate.   5. The aortic valve has an indeterminant number of cusps. There is  moderate calcification of the aortic valve. Aortic valve regurgitation is  not visualized. Aortic valve sclerosis/calcification is present, without  any evidence of aortic stenosis.   6. The inferior vena cava is normal in size with greater than 50%  respiratory variability, suggesting right atrial pressure of 3 mmHg.   Cardiac cath February 2019: Significant native CAD with total occlusion of the LAD after the takeoff of the first diagonal vessel; total occlusion of the proximal left circumflex coronary artery; and total occlusion of the proximal RCA with antegrade bridging collaterals.  Patent LIMA to LAD.  Patent Y vein graft from the 1996 surgery which supplies the diagonal vessel and distal circumflex marginal vessel.  There is diffuse narrowing of 50% in the midportion of the Y graft supplying the diagonal vessel and a patent distal stent extending to the ostium of the diagonal.  The Y graft supplying the distal marginal is free of significant disease and has mild luminal irregularity. Patent SVG supplying the distal RCA from the 2007 surgery with a stent in the proximal portion of the graft with 30% narrowings in the proximal and mid segment.  LVEDP 18 mm  Diagnostic  Diagram        EKG:  EKG is  ordered today. The ekg ordered today demonstrates    Recent Labs: 05/28/2022: ALT 43; B Natriuretic Peptide 788.6; TSH 1.951 05/30/2022: Magnesium 2.7 05/31/2022: Hemoglobin 9.3; Platelets 155 06/17/2022: BUN 62; Creatinine, Ser 5.05; Potassium 4.4; Sodium 141   Lipid Panel    Component Value Date/Time   CHOL 153 11/24/2021 1625   TRIG 54 11/24/2021 1625   HDL 60 11/24/2021 1625   CHOLHDL 2.6 11/24/2021 1625   CHOLHDL 4.5 12/13/2014 0423   VLDL 25 12/13/2014 0423   LDLCALC 82 11/24/2021 1625     Wt Readings from Last 3 Encounters:  07/07/22 104.8 kg  06/17/22 103.3 kg  06/15/22 103 kg    Assessment and Plan:   1. CAD s/p CABG without angina: Cardiac cath February 2019 with 4 patent vein grafts. No chest pain. Continue statin, beta blocker and Imdur. He is off of ASA due to easy bruising. He is also on Eliquis.        2. Ischemic Cardiomyopathy: LVEF =35-40% by echo in December 2023. Continue beta blocker. His  ARB was stopped due to hypotension.    3. Hypertension: BP is well controlled. No changes   4. Hyperlipidemia: Lipids followed in primary care. LDL near goal in 2023. Continue statin   5. CKD: Followed by Nephrology. He is now off of the ARB   6. Chronic systolic CHF: Weight stable. Continue Lasix 40 mg daily.    7. PVCs/Bigeminy: No palpitations. Continue beta blocker   8. AAA: Slable at 3.1 cm by u/s April 2022.       9. Atrial fibrillation, paroxysmal: Sinus today on exam. Will continue beta blocker, amiodarone and Eliquis. TSH and chest xray ok March 2023. LFTs normal in June 2023. He has f/u in the EP clinic with Dr. Lovena Le next week.    10. Sleep apnea: he is on CPAP.  Labs/ tests ordered today include:  No orders of the defined types were placed in this encounter.  Disposition:   F/U with me in 12 months.    Signed, Lauree Chandler, MD 07/07/2022 4:39 PM    [facility] [address],  [place], [place]  [address] Phone: [phone]; Fax: [phone]

## 2022-07-07 NOTE — Patient Instructions (Signed)

## 2022-07-08 ENCOUNTER — Ambulatory Visit: Payer: Medicare Other | Attending: Family | Admitting: Family

## 2022-07-08 ENCOUNTER — Encounter: Payer: Self-pay | Admitting: Family

## 2022-07-08 ENCOUNTER — Encounter (HOSPITAL_COMMUNITY): Payer: Self-pay | Admitting: *Deleted

## 2022-07-08 VITALS — BP 117/69 | HR 99 | Resp 18 | Wt 232.0 lb

## 2022-07-08 DIAGNOSIS — J449 Chronic obstructive pulmonary disease, unspecified: Secondary | ICD-10-CM | POA: Diagnosis not present

## 2022-07-08 DIAGNOSIS — N186 End stage renal disease: Secondary | ICD-10-CM | POA: Diagnosis not present

## 2022-07-08 DIAGNOSIS — I5042 Chronic combined systolic (congestive) and diastolic (congestive) heart failure: Secondary | ICD-10-CM | POA: Insufficient documentation

## 2022-07-08 DIAGNOSIS — K219 Gastro-esophageal reflux disease without esophagitis: Secondary | ICD-10-CM | POA: Diagnosis not present

## 2022-07-08 DIAGNOSIS — I480 Paroxysmal atrial fibrillation: Secondary | ICD-10-CM | POA: Insufficient documentation

## 2022-07-08 DIAGNOSIS — E1122 Type 2 diabetes mellitus with diabetic chronic kidney disease: Secondary | ICD-10-CM | POA: Insufficient documentation

## 2022-07-08 DIAGNOSIS — R42 Dizziness and giddiness: Secondary | ICD-10-CM | POA: Diagnosis not present

## 2022-07-08 DIAGNOSIS — I2510 Atherosclerotic heart disease of native coronary artery without angina pectoris: Secondary | ICD-10-CM | POA: Insufficient documentation

## 2022-07-08 DIAGNOSIS — E785 Hyperlipidemia, unspecified: Secondary | ICD-10-CM | POA: Insufficient documentation

## 2022-07-08 DIAGNOSIS — I5020 Unspecified systolic (congestive) heart failure: Secondary | ICD-10-CM

## 2022-07-08 DIAGNOSIS — I10 Essential (primary) hypertension: Secondary | ICD-10-CM | POA: Diagnosis not present

## 2022-07-08 DIAGNOSIS — Z992 Dependence on renal dialysis: Secondary | ICD-10-CM | POA: Diagnosis not present

## 2022-07-08 DIAGNOSIS — I132 Hypertensive heart and chronic kidney disease with heart failure and with stage 5 chronic kidney disease, or end stage renal disease: Secondary | ICD-10-CM | POA: Insufficient documentation

## 2022-07-08 DIAGNOSIS — Z87891 Personal history of nicotine dependence: Secondary | ICD-10-CM | POA: Diagnosis not present

## 2022-07-08 DIAGNOSIS — N185 Chronic kidney disease, stage 5: Secondary | ICD-10-CM

## 2022-07-08 NOTE — Patient Instructions (Signed)
Continue weighing daily and call for an overnight weight gain of 3 pounds or more or a weekly weight gain of more than 5 pounds. ? ? ?If you have voicemail, please make sure your mailbox is cleaned out so that we may leave a message and please make sure to listen to any voicemails.  ? ? ?

## 2022-07-08 NOTE — Progress Notes (Signed)
Patient ID: [name], male    DOB: 1946/02/25, 77 y.o.   MRN: [id number]  HPI  Ronald [name] is a 77 y/o male with a history of AAA, CAD, DM, hyperlipidemia, HTN, CKD, COPD, GERD, gout, pneumonia, previous tobacco use and chronic heart failure.   Echo 05/29/22 showed an EF of 35-40% along with normal PA pressure, mild Ronald and mild/moderate TR.   LHC 07/28/17 showed: Ost RCA to Prox RCA lesion is 95% stenosed. Prox RCA lesion is 100% stenosed. Prox Cx lesion is 100% stenosed. Prox LAD lesion is 100% stenosed. Origin lesion is 100% stenosed. Origin lesion is 100% stenosed. Previously placed Dist Graft to Insertion stent (unknown type) is widely patent. Origin to Prox Graft lesion is 10% stenosed. Prox Graft lesion is 30% stenosed. Mid Graft to Dist Graft lesion is 30% stenosed. Mid Graft lesion is 50% stenosed.   Significant native CAD with total occlusion of the LAD after the takeoff of the first diagonal vessel; total occlusion of the proximal left circumflex coronary artery; and total occlusion of the proximal RCA with antegrade bridging collaterals.  Patent LIMA to LAD.  Patent Y vein graft from the 1996 surgery which supplies the diagonal vessel and distal circumflex marginal vessel.  There is diffuse narrowing of 50% in the midportion of the Y graft supplying the diagonal vessel and a patent distal stent extending to the ostium of the diagonal.  The Y graft supplying the distal marginal is free of significant disease and has mild luminal irregularity. Occluded vein graft which had supplied the distal RCA from the 1996 surgery. Patent SVG supplying the distal RCA from the 2007 surgery with a stent in the proximal portion of the graft with 30% narrowings in the proximal and mid segment. Occluded vein graft from 2007 which appears to also have been stented. LVEDP 18 mm  Admitted 05/28/22 due to a/c HF. Diuresed with lasix gtt. Cardiology/ nephrology consults obtained.   He presents  today for a follow-up visit with a chief complaint of minimal fatigue with moderate exertion. Describes this as chronic. Has associated SOB, dizziness and slight weight gain along with this. Denies any difficulty sleeping, abdominal distention, palpitations, pedal edema, chest pain or cough.   Home weight ranges from 224-226 pounds. Doesn't add salt and normally watches sodium intake. Did eat at [employer] yesterday and got a sandwich but says that it still tasted salty to him.   Getting ready to have a port placed in his abdomen as he will be starting peritoneal dialysis in the future.   Past Medical History:  Diagnosis Date   AAA (abdominal aortic aneurysm) ([place])    a. 3cm by [place] 2015.   Arthritis    "hips; back" (12/13/2014)   CAD (coronary artery disease) 2007   a. s/p CABG- IMA-LAD, VG-Cx, VG-RCA, VG-diag in 1999. B. sp redo CABG- VG-OM, VG-RCA in 2007 due to VG disease. c. NSTEMI 11/2014 s/p DES to SVG-OM from the Y graft.d. PTCA/DES x 1 distal body of SVG to Diagonal.09/2015   Chronic combined systolic and diastolic CHF (congestive heart failure) ([place])    a. remote EF 40-45% in 2006. b. Normal EF 2014. b. Echo 07/2016 EF 45-50%, grade 1 DD. c. Echo 2020 30% to 35%. Diffuse EF 30-35%, diffuse hypokinesis.   Chronic lower back pain    CKD (chronic kidney disease), stage IV (HCC)    COPD (chronic obstructive pulmonary disease) (HCC)    Deafness in left ear    Degenerative  disc disease, lumbar    Diabetes mellitus, type 2 ([place]) 10/04/2014   Microalbumin 05/11/2012-100. Foot exam/monofilament 05/11/2012-normal.   Dilated cardiomyopathy ([place]) 10/07/2015   Emphysema    Esophageal stricture 07/02/1998   EGD   Genital candidiasis in male 10/25/2012   GERD (gastroesophageal reflux disease)    History of gout    "last flareup was in 2007" (12/13/2014)   History of hiatal hernia    Hyperlipidemia    Hypertension    Ischemic cardiomyopathy 2006   Echo 2020: EF 30-35%, diffuse hypokinesis   NSTEMI  (non-ST elevated myocardial infarction) ([place]) 12/13/2014   PVC's (premature ventricular contractions)    Renal artery stenosis ([place])    a. noted on CT 2008.   Type II diabetes mellitus ([place])    Diet control    Unstable angina ([place]) 07/27/2017   Walking pneumonia 1990's   Wears dentures    full upper   Past Surgical History:  Procedure Laterality Date   CARDIAC CATHETERIZATION  "several"   CARDIAC CATHETERIZATION N/A 12/13/2014   Procedure: Left Heart Cath and Coronary Angiography;  Surgeon: Jettie Booze, MD;  Location: [place] [facility];  Service: Cardiovascular;  Laterality: N/A;   CARDIAC CATHETERIZATION  12/13/2014   Procedure: Coronary Stent Intervention;  Surgeon: Jettie Booze, MD;  Location: [place] [facility];  Service: Cardiovascular;;   CARDIAC CATHETERIZATION N/A 10/07/2015   Procedure: Left Heart Cath and Cors/Grafts Angiography;  Surgeon: Burnell Blanks, MD;  Location: [place] [facility];  Service: Cardiovascular;  Laterality: N/A;   CARDIAC CATHETERIZATION N/A 10/07/2015   Procedure: Coronary Stent Intervention;  Surgeon: Burnell Blanks, MD;  Location: [place] [facility];  Service: Cardiovascular;  Laterality: N/A;   CARDIOVERSION N/A 05/25/2022   Procedure: CARDIOVERSION;  Surgeon: Berniece Salines, DO;  Location: [facility];  Service: Cardiovascular;  Laterality: N/A;   CATARACT EXTRACTION W/PHACO Left 10/21/2021   Procedure: CATARACT EXTRACTION PHACO AND INTRAOCULAR LENS PLACEMENT (IOC) LEFT 3.94 00:33.7;  Surgeon: Eulogio Bear, MD;  Location: [place];  Service: Ophthalmology;  Laterality: Left;   CATARACT EXTRACTION W/PHACO Right 11/09/2021   Procedure: CATARACT EXTRACTION PHACO AND INTRAOCULAR LENS PLACEMENT (IOC) RIGHT;  Surgeon: Eulogio Bear, MD;  Location: [place];  Service: Ophthalmology;  Laterality: Right;  3.59 0:29.4   CORONARY ANGIOPLASTY  "several"   CORONARY ANGIOPLASTY WITH STENT PLACEMENT  2005;  12/13/2014   "2; 1"   CORONARY ARTERY BYPASS GRAFT  1996   CABG X5   CORONARY ARTERY BYPASS GRAFT  March 2007   CABG X3   ESOPHAGOGASTRODUODENOSCOPY (EGD) WITH ESOPHAGEAL DILATION  2000   GREEN LIGHT LASER TURP (TRANSURETHRAL RESECTION OF PROSTATE  2000's   "not cancerous"   HERNIA REPAIR     LAPAROSCOPIC CHOLECYSTECTOMY     LEFT HEART CATH AND CORS/GRAFTS ANGIOGRAPHY N/A 07/28/2017   Procedure: LEFT HEART CATH AND CORS/GRAFTS ANGIOGRAPHY;  Surgeon: Troy Sine, MD;  Location: [facility];  Service: Cardiovascular;  Laterality: N/A;   LUNG SURGERY  1996   "S/P CABG, had to put staple in lung after it had collapsed"   UMBILICAL HERNIA REPAIR     w/chole   Family History  Problem Relation Age of Onset   Heart attack Mother        MI   Stroke Mother    Heart disease Mother    Hypertension Mother    Hyperlipidemia Mother    Asthma Mother    Heart disease Father  Rheumatic fever Father    Colon cancer Neg Hx    Social History   Tobacco Use   Smoking status: Former    Packs/day: 3.00    Years: 20.00    Total pack years: 60.00    Types: Cigarettes    Quit date: 07/25/1986    Years since quitting: 35.9   Smokeless tobacco: Never  Substance Use Topics   Alcohol use: No    Alcohol/week: 0.0 standard drinks of alcohol   Allergies  Allergen Reactions   Predicort [Prednisolone] Other (See Comments)    Stomach pain   Ciprofloxacin Other (See Comments)    GI upset   Hydrochlorothiazide Other (See Comments)    Dehydration   Hydrocodone Nausea Only and Other (See Comments)    Stomach upset    Hydrocodone-Acetaminophen Nausea Only    Stomach upset   Sulfa Antibiotics Other (See Comments)    Cannot recall   Hydrocodone-Acetaminophen Nausea Only and Other (See Comments)    Stomach upset   Penicillins Hives, Rash and Other (See Comments)    Has patient had a PCN reaction causing immediate rash, facial/tongue/throat swelling, SOB or lightheadedness with  hypotension: YES  Has patient had a PCN reaction causing severe rash involving mucus membranes or skin necrosis: NO  Has patient had a PCN reaction that required hospitalization NO  Has patient had a PCN reaction occurring within the last 10 years:NO  If all of the above answers are "NO", then may proceed with Cephalosporin use.  Has patient had a PCN reaction causing immediate rash, facial/tongue/throat swelling, SOB or lightheadedness with hypotension: YES Has patient had a PCN reaction causing severe rash involving mucus membranes or skin necrosis: NO Has patient had a PCN reaction that required hospitalization NO Has patient had a PCN reaction occurring within the last 10 years:NO If all of the above answers are "NO", then may proceed with Cephalosporin use.   Prior to Admission medications   Medication Sig Start Date End Date Taking? Authorizing Provider  acetaminophen (TYLENOL) 650 MG CR tablet Take 1,300 mg by mouth at bedtime.   Yes [provider]  allopurinol (ZYLOPRIM) 100 MG tablet TAKE 1 TABLET BY MOUTH EVERYDAY AT BEDTIME 01/11/22  Yes Eulas Post, MD  amiodarone (PACERONE) 200 MG tablet Take 200 mg by mouth daily.   Yes [provider]  apixaban (ELIQUIS) 5 MG TABS tablet Take 1 tablet (5 mg total) by mouth 2 (two) times daily. 09/09/21  Yes Burnell Blanks, MD  atorvastatin (LIPITOR) 40 MG tablet Take 1 tablet (40 mg total) by mouth daily. Patient taking differently: Take 40 mg by mouth at bedtime. 01/11/22  Yes Eulas Post, MD  Cholecalciferol (VITAMIN D-3) 125 MCG (5000 UT) TABS Take 2,000 Units by mouth daily.    Yes [provider]  furosemide (LASIX) 40 MG tablet Take 40 mg by mouth daily in the afternoon. 06/02/22  Yes [provider]  isosorbide mononitrate (IMDUR) 30 MG 24 hr tablet TAKE 1 TABLET BY MOUTH DAILY Patient taking differently: Take 30 mg by mouth daily. 01/08/22  Yes Burnell Blanks, MD   levothyroxine (SYNTHROID) 75 MCG tablet Take 1 tablet (75 mcg total) by mouth daily before breakfast. 01/11/22  Yes Eulas Post, MD  metoprolol tartrate (LOPRESSOR) 100 MG tablet TAKE 1 TABLET BY MOUTH TWICE  DAILY Patient taking differently: Take 100 mg by mouth 2 (two) times daily. 01/08/22  Yes Burnell Blanks, MD  Multiple Vitamin (MULTIVITAMIN PO)  Take 1 tablet by mouth daily.   Yes [provider]  nitroGLYCERIN (NITROSTAT) 0.4 MG SL tablet Place 0.4 mg under the tongue every 5 (five) minutes as needed for chest pain (Up to 3 times).   Yes [provider]  pantoprazole (PROTONIX) 40 MG tablet TAKE 2 TABLETS BY MOUTH EVERY DAY Patient taking differently: Take 40 mg by mouth 2 (two) times daily. 12/21/21  Yes Eulas Post, MD  polyethylene glycol (MIRALAX / GLYCOLAX) 17 g packet Take 17 g by mouth at bedtime.   Yes [provider]  sodium bicarbonate 650 MG tablet Take 650 mg by mouth 2 (two) times daily.   Yes [provider]  loratadine (CLARITIN) 10 MG tablet Take 10 mg by mouth daily as needed. Patient not taking: Reported on 07/08/2022    [provider]   Review of Systems  Constitutional:  Positive for fatigue. Negative for appetite change.  HENT:  Positive for hearing loss. Negative for congestion.   Eyes: Negative.   Respiratory:  Positive for shortness of breath. Negative for cough and chest tightness.   Cardiovascular:  Negative for chest pain, palpitations and leg swelling.  Gastrointestinal:  Negative for abdominal distention and abdominal pain.  Endocrine: Negative.   Genitourinary: Negative.   Musculoskeletal:  Negative for back pain and neck pain.  Skin: Negative.   Allergic/Immunologic: Negative.   Neurological:  Positive for dizziness, weakness and light-headedness.  Hematological:  Negative for adenopathy. Does not bruise/bleed easily.  Psychiatric/Behavioral:  Negative for dysphoric mood and sleep  disturbance (sleeping on 2 pillows). The patient is not nervous/anxious.    Vitals:   07/08/22 1352  BP: 117/69  Pulse: 99  Resp: 18  SpO2: 99%  Weight: 232 lb (105.2 kg)   Wt Readings from Last 3 Encounters:  07/08/22 232 lb (105.2 kg)  07/07/22 231 lb (104.8 kg)  06/17/22 227 lb 12.8 oz (103.3 kg)   Lab Results  Component Value Date   CREATININE 5.05 (H) 06/17/2022   CREATININE 4.21 (H) 05/31/2022   CREATININE 4.10 (H) 05/30/2022   Physical Exam Vitals and nursing note reviewed. Exam conducted with a chaperone present (wife).  Constitutional:      Appearance: Normal appearance.  HENT:     Head: Normocephalic and atraumatic.  Cardiovascular:     Rate and Rhythm: Normal rate. Rhythm irregular.  Pulmonary:     Effort: Pulmonary effort is normal. No respiratory distress.     Breath sounds: No wheezing, rhonchi or rales.  Abdominal:     General: There is no distension.     Palpations: Abdomen is soft.     Tenderness: There is no abdominal tenderness.  Musculoskeletal:        General: No tenderness.     Cervical back: Normal range of motion and neck supple.     Right lower leg: No edema.     Left lower leg: No edema.  Skin:    General: Skin is warm and dry.  Neurological:     General: No focal deficit present.     Mental Status: He is alert and oriented to person, place, and time.  Psychiatric:        Mood and Affect: Mood normal.        Behavior: Behavior normal.        Thought Content: Thought content normal.   Assessment & Plan:  1: Chronic heart failure with reduced ejection fraction- - NYHA class II - euvolemic - weighing daily and  says home weight has ranged from 224-226 pounds; reminded to call for an overnight weight gain of > 2 pounds or a weekly weight gain of > 5 pounds - weight up 5 pounds from last visit here 3 weeks ago - not adding salt and reading food labels regarding sodium content; did go out to eat last night and knows the food was saltier  than he normally eats - GDMT of metoprolol (although tartrate) - CKD limits other GDMT - BNP 05/28/22 was 788.6  2: HTN w/ CKD- - BP 117/68 - saw nephrology Candiss Norse) 06/21/22 - will be getting ready to start peritoneal dialysis - BMP 06/21/22 showed sodium 144, potassium 4.6, creatinine 4.63 and GFR 12  3: DM- - saw PCP Army Melia) 04/06/22 - A1c 05/29/22 was 6.1%  4: PAF- - saw cardiology Angelena Form) 07/07/22; returns 01/28/23   Medication list reviewed.  Return in 3 months, sooner if needed.

## 2022-07-13 DIAGNOSIS — N2581 Secondary hyperparathyroidism of renal origin: Secondary | ICD-10-CM | POA: Diagnosis not present

## 2022-07-13 DIAGNOSIS — I129 Hypertensive chronic kidney disease with stage 1 through stage 4 chronic kidney disease, or unspecified chronic kidney disease: Secondary | ICD-10-CM | POA: Diagnosis not present

## 2022-07-13 DIAGNOSIS — N185 Chronic kidney disease, stage 5: Secondary | ICD-10-CM | POA: Diagnosis not present

## 2022-07-13 DIAGNOSIS — I10 Essential (primary) hypertension: Secondary | ICD-10-CM | POA: Diagnosis not present

## 2022-07-13 DIAGNOSIS — R600 Localized edema: Secondary | ICD-10-CM | POA: Diagnosis not present

## 2022-07-15 ENCOUNTER — Encounter: Payer: Medicare Other | Admitting: Family

## 2022-07-15 ENCOUNTER — Other Ambulatory Visit: Payer: Self-pay

## 2022-07-15 ENCOUNTER — Encounter: Payer: Self-pay | Admitting: Emergency Medicine

## 2022-07-15 ENCOUNTER — Emergency Department: Payer: Medicare Other

## 2022-07-15 ENCOUNTER — Ambulatory Visit: Payer: Medicare Other | Admitting: Internal Medicine

## 2022-07-15 ENCOUNTER — Inpatient Hospital Stay
Admission: EM | Admit: 2022-07-15 | Discharge: 2022-07-17 | DRG: 303 | Disposition: A | Discharge status: Home / Self Care | Payer: Medicare Other | Attending: Hospitalist | Admitting: Hospitalist

## 2022-07-15 DIAGNOSIS — Z79890 Hormone replacement therapy (postmenopausal): Secondary | ICD-10-CM

## 2022-07-15 DIAGNOSIS — Z823 Family history of stroke: Secondary | ICD-10-CM

## 2022-07-15 DIAGNOSIS — I255 Ischemic cardiomyopathy: Secondary | ICD-10-CM | POA: Diagnosis present

## 2022-07-15 DIAGNOSIS — Z8249 Family history of ischemic heart disease and other diseases of the circulatory system: Secondary | ICD-10-CM

## 2022-07-15 DIAGNOSIS — D631 Anemia in chronic kidney disease: Secondary | ICD-10-CM | POA: Diagnosis present

## 2022-07-15 DIAGNOSIS — Z7901 Long term (current) use of anticoagulants: Secondary | ICD-10-CM | POA: Diagnosis not present

## 2022-07-15 DIAGNOSIS — G4733 Obstructive sleep apnea (adult) (pediatric): Secondary | ICD-10-CM | POA: Diagnosis not present

## 2022-07-15 DIAGNOSIS — I252 Old myocardial infarction: Secondary | ICD-10-CM

## 2022-07-15 DIAGNOSIS — K219 Gastro-esophageal reflux disease without esophagitis: Secondary | ICD-10-CM | POA: Diagnosis not present

## 2022-07-15 DIAGNOSIS — I080 Rheumatic disorders of both mitral and aortic valves: Secondary | ICD-10-CM | POA: Diagnosis not present

## 2022-07-15 DIAGNOSIS — I10 Essential (primary) hypertension: Secondary | ICD-10-CM | POA: Diagnosis present

## 2022-07-15 DIAGNOSIS — Z955 Presence of coronary angioplasty implant and graft: Secondary | ICD-10-CM

## 2022-07-15 DIAGNOSIS — I209 Angina pectoris, unspecified: Secondary | ICD-10-CM

## 2022-07-15 DIAGNOSIS — E785 Hyperlipidemia, unspecified: Secondary | ICD-10-CM | POA: Diagnosis not present

## 2022-07-15 DIAGNOSIS — Z8701 Personal history of pneumonia (recurrent): Secondary | ICD-10-CM

## 2022-07-15 DIAGNOSIS — N184 Chronic kidney disease, stage 4 (severe): Secondary | ICD-10-CM | POA: Diagnosis present

## 2022-07-15 DIAGNOSIS — I420 Dilated cardiomyopathy: Secondary | ICD-10-CM | POA: Diagnosis not present

## 2022-07-15 DIAGNOSIS — Z880 Allergy status to penicillin: Secondary | ICD-10-CM

## 2022-07-15 DIAGNOSIS — Z825 Family history of asthma and other chronic lower respiratory diseases: Secondary | ICD-10-CM

## 2022-07-15 DIAGNOSIS — Z882 Allergy status to sulfonamides status: Secondary | ICD-10-CM

## 2022-07-15 DIAGNOSIS — I5022 Chronic systolic (congestive) heart failure: Secondary | ICD-10-CM | POA: Diagnosis present

## 2022-07-15 DIAGNOSIS — I200 Unstable angina: Principal | ICD-10-CM

## 2022-07-15 DIAGNOSIS — R072 Precordial pain: Secondary | ICD-10-CM | POA: Diagnosis not present

## 2022-07-15 DIAGNOSIS — E1122 Type 2 diabetes mellitus with diabetic chronic kidney disease: Secondary | ICD-10-CM | POA: Diagnosis present

## 2022-07-15 DIAGNOSIS — I480 Paroxysmal atrial fibrillation: Secondary | ICD-10-CM | POA: Diagnosis not present

## 2022-07-15 DIAGNOSIS — I483 Typical atrial flutter: Secondary | ICD-10-CM

## 2022-07-15 DIAGNOSIS — E039 Hypothyroidism, unspecified: Secondary | ICD-10-CM | POA: Diagnosis present

## 2022-07-15 DIAGNOSIS — I130 Hypertensive heart and chronic kidney disease with heart failure and stage 1 through stage 4 chronic kidney disease, or unspecified chronic kidney disease: Secondary | ICD-10-CM | POA: Diagnosis not present

## 2022-07-15 DIAGNOSIS — I444 Left anterior fascicular block: Secondary | ICD-10-CM | POA: Diagnosis not present

## 2022-07-15 DIAGNOSIS — R109 Unspecified abdominal pain: Secondary | ICD-10-CM | POA: Diagnosis present

## 2022-07-15 DIAGNOSIS — I701 Atherosclerosis of renal artery: Secondary | ICD-10-CM | POA: Diagnosis not present

## 2022-07-15 DIAGNOSIS — H9192 Unspecified hearing loss, left ear: Secondary | ICD-10-CM | POA: Diagnosis not present

## 2022-07-15 DIAGNOSIS — J439 Emphysema, unspecified: Secondary | ICD-10-CM | POA: Diagnosis present

## 2022-07-15 DIAGNOSIS — I445 Left posterior fascicular block: Secondary | ICD-10-CM | POA: Diagnosis present

## 2022-07-15 DIAGNOSIS — N2581 Secondary hyperparathyroidism of renal origin: Secondary | ICD-10-CM | POA: Diagnosis present

## 2022-07-15 DIAGNOSIS — Z9049 Acquired absence of other specified parts of digestive tract: Secondary | ICD-10-CM

## 2022-07-15 DIAGNOSIS — I714 Abdominal aortic aneurysm, without rupture, unspecified: Secondary | ICD-10-CM | POA: Diagnosis not present

## 2022-07-15 DIAGNOSIS — I25700 Atherosclerosis of coronary artery bypass graft(s), unspecified, with unstable angina pectoris: Secondary | ICD-10-CM | POA: Diagnosis not present

## 2022-07-15 DIAGNOSIS — Z79899 Other long term (current) drug therapy: Secondary | ICD-10-CM

## 2022-07-15 DIAGNOSIS — I120 Hypertensive chronic kidney disease with stage 5 chronic kidney disease or end stage renal disease: Secondary | ICD-10-CM | POA: Diagnosis present

## 2022-07-15 DIAGNOSIS — I2510 Atherosclerotic heart disease of native coronary artery without angina pectoris: Secondary | ICD-10-CM | POA: Diagnosis present

## 2022-07-15 DIAGNOSIS — R0789 Other chest pain: Secondary | ICD-10-CM | POA: Diagnosis not present

## 2022-07-15 DIAGNOSIS — Z888 Allergy status to other drugs, medicaments and biological substances status: Secondary | ICD-10-CM

## 2022-07-15 DIAGNOSIS — R079 Chest pain, unspecified: Secondary | ICD-10-CM | POA: Diagnosis present

## 2022-07-15 DIAGNOSIS — Z885 Allergy status to narcotic agent status: Secondary | ICD-10-CM

## 2022-07-15 DIAGNOSIS — Z83438 Family history of other disorder of lipoprotein metabolism and other lipidemia: Secondary | ICD-10-CM

## 2022-07-15 DIAGNOSIS — Z87891 Personal history of nicotine dependence: Secondary | ICD-10-CM

## 2022-07-15 DIAGNOSIS — Z881 Allergy status to other antibiotic agents status: Secondary | ICD-10-CM

## 2022-07-15 LAB — BASIC METABOLIC PANEL
Anion gap: 10 (ref 5–15)
BUN: 62 mg/dL — ABNORMAL HIGH (ref 8–23)
CO2: 24 mmol/L (ref 22–32)
Calcium: 9.2 mg/dL (ref 8.9–10.3)
Chloride: 107 mmol/L (ref 98–111)
Creatinine, Ser: 4.15 mg/dL — ABNORMAL HIGH (ref 0.61–1.24)
GFR, Estimated: 14 mL/min — ABNORMAL LOW (ref >60)
Glucose, Bld: 112 mg/dL — ABNORMAL HIGH (ref 70–99)
Potassium: 4.1 mmol/L (ref 3.5–5.1)
Sodium: 141 mmol/L (ref 135–145)

## 2022-07-15 LAB — HEPARIN LEVEL (UNFRACTIONATED): Heparin Unfractionated: >1.1 IU/mL — ABNORMAL HIGH (ref 0.30–0.70)

## 2022-07-15 LAB — BRAIN NATRIURETIC PEPTIDE: B Natriuretic Peptide: 684.9 pg/mL — ABNORMAL HIGH (ref 0.0–100.0)

## 2022-07-15 LAB — CBC
HCT: 32.1 % — ABNORMAL LOW (ref 39.0–52.0)
Hemoglobin: 9.9 g/dL — ABNORMAL LOW (ref 13.0–17.0)
MCH: 27.1 pg (ref 26.0–34.0)
MCHC: 30.8 g/dL (ref 30.0–36.0)
MCV: 87.9 fL (ref 80.0–100.0)
Platelets: 136 10*3/uL — ABNORMAL LOW (ref 150–400)
RBC: 3.65 MIL/uL — ABNORMAL LOW (ref 4.22–5.81)
RDW: 17.1 % — ABNORMAL HIGH (ref 11.5–15.5)
WBC: 5.4 10*3/uL (ref 4.0–10.5)
nRBC: 0 % (ref 0.0–0.2)

## 2022-07-15 LAB — CBG MONITORING, ED: Glucose-Capillary: 122 mg/dL — ABNORMAL HIGH (ref 70–99)

## 2022-07-15 LAB — TROPONIN I (HIGH SENSITIVITY)
Troponin I (High Sensitivity): 13 ng/L (ref <18)
Troponin I (High Sensitivity): 12 ng/L (ref <18)

## 2022-07-15 LAB — PROTIME-INR
INR: 1.6 — ABNORMAL HIGH (ref 0.8–1.2)
Prothrombin Time: 18.9 seconds — ABNORMAL HIGH (ref 11.4–15.2)

## 2022-07-15 LAB — APTT
aPTT: 37 seconds — ABNORMAL HIGH (ref 24–36)
aPTT: 90 seconds — ABNORMAL HIGH (ref 24–36)

## 2022-07-15 MED ORDER — ISOSORBIDE MONONITRATE ER 60 MG PO TB24
30.0000 mg | ORAL_TABLET | Freq: Every day | ORAL | Status: DC
  Administered 2022-07-15: 30 mg via ORAL
  Filled 2022-07-15: qty 1

## 2022-07-15 MED ORDER — ISOSORBIDE MONONITRATE ER 60 MG PO TB24: 30.0000 mg | ORAL_TABLET | Freq: Once | ORAL | Status: DC

## 2022-07-15 MED ORDER — HEPARIN (PORCINE) 25000 UT/250ML-% IV SOLN
1200.0000 [IU]/h | INTRAVENOUS | Status: DC
  Administered 2022-07-15 – 2022-07-17 (×3): 1200 [IU]/h via INTRAVENOUS
  Filled 2022-07-15 (×3): qty 250

## 2022-07-15 MED ORDER — ONDANSETRON HCL 4 MG/2ML IJ SOLN: 4.0000 mg | Freq: Four times a day (QID) | INTRAMUSCULAR | Status: DC | PRN

## 2022-07-15 MED ORDER — VITAMIN D 25 MCG (1000 UNIT) PO TABS
2000.0000 [IU] | ORAL_TABLET | Freq: Every day | ORAL | Status: DC
  Administered 2022-07-15 – 2022-07-17 (×3): 2000 [IU] via ORAL
  Filled 2022-07-15 (×3): qty 2

## 2022-07-15 MED ORDER — FUROSEMIDE 10 MG/ML IJ SOLN
40.0000 mg | Freq: Once | INTRAMUSCULAR | Status: AC
  Administered 2022-07-15: 40 mg via INTRAVENOUS
  Filled 2022-07-15: qty 4

## 2022-07-15 MED ORDER — ASPIRIN 81 MG PO TBEC
81.0000 mg | DELAYED_RELEASE_TABLET | Freq: Every day | ORAL | Status: DC
  Administered 2022-07-16 – 2022-07-17 (×2): 81 mg via ORAL
  Filled 2022-07-15 (×2): qty 1

## 2022-07-15 MED ORDER — ASPIRIN 81 MG PO CHEW: 324.0000 mg | CHEWABLE_TABLET | ORAL | Status: DC

## 2022-07-15 MED ORDER — POLYETHYLENE GLYCOL 3350 17 G PO PACK: 17.0000 g | PACK | Freq: Every evening | ORAL | Status: DC | PRN

## 2022-07-15 MED ORDER — ATORVASTATIN CALCIUM 20 MG PO TABS
40.0000 mg | ORAL_TABLET | Freq: Every day | ORAL | Status: DC
  Administered 2022-07-15 – 2022-07-16 (×2): 40 mg via ORAL
  Filled 2022-07-15 (×2): qty 2

## 2022-07-15 MED ORDER — NITROGLYCERIN 0.4 MG SL SUBL: 0.4000 mg | SUBLINGUAL_TABLET | SUBLINGUAL | Status: DC | PRN

## 2022-07-15 MED ORDER — LEVOTHYROXINE SODIUM 50 MCG PO TABS
75.0000 ug | ORAL_TABLET | Freq: Every day | ORAL | Status: DC
  Administered 2022-07-16 – 2022-07-17 (×2): 75 ug via ORAL
  Filled 2022-07-15 (×2): qty 1

## 2022-07-15 MED ORDER — METOPROLOL TARTRATE 25 MG PO TABS
100.0000 mg | ORAL_TABLET | Freq: Two times a day (BID) | ORAL | Status: DC
  Administered 2022-07-15 (×2): 100 mg via ORAL
  Filled 2022-07-15 (×2): qty 4

## 2022-07-15 MED ORDER — AMIODARONE HCL 200 MG PO TABS
200.0000 mg | ORAL_TABLET | Freq: Every day | ORAL | Status: DC
  Administered 2022-07-15 – 2022-07-17 (×3): 200 mg via ORAL
  Filled 2022-07-15 (×3): qty 1

## 2022-07-15 MED ORDER — ISOSORBIDE MONONITRATE ER 60 MG PO TB24
60.0000 mg | ORAL_TABLET | Freq: Every day | ORAL | Status: DC
  Administered 2022-07-16 – 2022-07-17 (×2): 60 mg via ORAL
  Filled 2022-07-15 (×2): qty 1

## 2022-07-15 MED ORDER — SODIUM BICARBONATE 650 MG PO TABS
650.0000 mg | ORAL_TABLET | Freq: Two times a day (BID) | ORAL | Status: DC
  Administered 2022-07-15 – 2022-07-17 (×5): 650 mg via ORAL
  Filled 2022-07-15 (×6): qty 1

## 2022-07-15 MED ORDER — HEPARIN BOLUS VIA INFUSION
4000.0000 [IU] | Freq: Once | INTRAVENOUS | Status: AC
  Administered 2022-07-15: 4000 [IU] via INTRAVENOUS
  Filled 2022-07-15: qty 4000

## 2022-07-15 MED ORDER — ADULT MULTIVITAMIN W/MINERALS CH
1.0000 | ORAL_TABLET | Freq: Every day | ORAL | Status: DC
  Administered 2022-07-15 – 2022-07-17 (×3): 1 via ORAL
  Filled 2022-07-15 (×3): qty 1

## 2022-07-15 MED ORDER — ACETAMINOPHEN 500 MG PO TABS
1000.0000 mg | ORAL_TABLET | Freq: Every day | ORAL | Status: DC
  Administered 2022-07-15 – 2022-07-16 (×2): 1000 mg via ORAL
  Filled 2022-07-15 (×2): qty 2

## 2022-07-15 MED ORDER — FUROSEMIDE 40 MG PO TABS
80.0000 mg | ORAL_TABLET | Freq: Every day | ORAL | Status: DC
  Administered 2022-07-16 – 2022-07-17 (×2): 80 mg via ORAL
  Filled 2022-07-15 (×2): qty 2

## 2022-07-15 MED ORDER — ASPIRIN 81 MG PO CHEW
324.0000 mg | CHEWABLE_TABLET | Freq: Once | ORAL | Status: AC
  Administered 2022-07-15: 324 mg via ORAL
  Filled 2022-07-15: qty 4

## 2022-07-15 MED ORDER — ALLOPURINOL 100 MG PO TABS
100.0000 mg | ORAL_TABLET | Freq: Every day | ORAL | Status: DC
  Administered 2022-07-15 – 2022-07-16 (×2): 100 mg via ORAL
  Filled 2022-07-15 (×2): qty 1

## 2022-07-15 MED ORDER — PANTOPRAZOLE SODIUM 40 MG PO TBEC
40.0000 mg | DELAYED_RELEASE_TABLET | Freq: Two times a day (BID) | ORAL | Status: DC
  Administered 2022-07-15 – 2022-07-17 (×5): 40 mg via ORAL
  Filled 2022-07-15 (×5): qty 1

## 2022-07-15 MED ORDER — ASPIRIN 300 MG RE SUPP: 300.0000 mg | RECTAL | Status: DC

## 2022-07-15 NOTE — Assessment & Plan Note (Signed)
Patient has diabetes mellitus type 2 with complications of stage IV chronic kidney disease. He continues to void Renal function appears to be at his baseline and will consult nephrology if worsening of his renal function is noted during this hospitalization

## 2022-07-15 NOTE — H&P (Signed)
History and Physical    Patient: Ronald [name] [id number] DOB: [birth year] DOA: 07/15/2022 DOS: the patient was seen and examined on 07/15/2022 PCP: Glean Hess, MD  Patient coming from: Home  Chief Complaint:  Chief Complaint  Patient presents with   Chest Pain   HPI: Ronald [name] is a 77 y.o. 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  Chest pain is not related to rest or exertion and patient also describes having pain mostly in the epigastrium after meals with radiation to his chest. On the morning of his admission he developed an episode of chest pain that woke him up out of his sleep associated with diaphoresis and shortness of breath.  He denied having any nausea, no vomiting, no palpitations. He has bilateral lower extremity swelling and denies having any shortness of breath with exertion or PND.  According to his wife he weighs himself daily and has not gained any weight.  She states that he has been compliant with his diet and medication. Patient was recently hospitalized from 05/28/22 to 05/31/22 for acute CHF exacerbation and at that time was found to be in A-fib despite having an ablation several days prior. Abnormal labs include BNP 684, BUN 62, creatinine 4.15, hemoglobin 9.9 Chest x-ray reviewed by me showed chronic cardiomegaly with vascular congestion.  Twelve-lead EKG reviewed by me shows sinus rhythm, left posterior fascicular block, ST  and T wave changes in the inferior lateral leads. At the time of my evaluation patient is currently chest pain-free He received Lasix 40 mg IV in the ER, aspirin and has been started on a heparin drip He will be referred to observation status for further evaluation.  Review of Systems: As mentioned in the history of present illness. All other systems reviewed and are negative. Past Medical History:  Diagnosis Date   AAA (abdominal aortic aneurysm) ([place])    a. 3cm by [place] 2015.   Arthritis    "hips; back" (12/13/2014)   CAD (coronary artery disease) 2007   a. s/p CABG- IMA-LAD, VG-Cx, VG-RCA, VG-diag in 1999. B. sp redo CABG- VG-OM, VG-RCA in 2007 due to VG disease. c. NSTEMI 11/2014 s/p DES to SVG-OM from the Y graft.d. PTCA/DES x 1 distal body of SVG to Diagonal.09/2015   Chronic combined systolic and diastolic CHF (congestive heart failure) ([place])    a. remote EF 40-45% in 2006. b. Normal EF 2014. b. Echo 07/2016 EF 45-50%, grade 1 DD. c. Echo 2020 30% to 35%. Diffuse EF 30-35%, diffuse hypokinesis.   Chronic lower back pain    CKD (chronic kidney disease), stage IV (HCC)    COPD (chronic obstructive pulmonary disease) (HCC)    Deafness in left ear    Degenerative disc disease, lumbar    Diabetes mellitus, type 2 ([place]) 10/04/2014   Microalbumin 05/11/2012-100. Foot exam/monofilament 05/11/2012-normal.   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 noted on CT 2008.   Type II diabetes mellitus ([place])    Diet control     Unstable angina ([place]) 07/27/2017   Walking pneumonia 1990's   Wears dentures    full upper   Past Surgical History:  Procedure Laterality Date   CARDIAC CATHETERIZATION  "several"   CARDIAC CATHETERIZATION N/A 12/13/2014   Procedure: Left Heart Cath and Coronary Angiography;  Surgeon: Jettie Booze, MD;  Location: [facility];  Service: Cardiovascular;  Laterality: N/A;   CARDIAC CATHETERIZATION  12/13/2014   Procedure: Coronary Stent Intervention;  Surgeon: Jettie Booze, MD;  Location: [facility];  Service: Cardiovascular;;   CARDIAC CATHETERIZATION N/A 10/07/2015   Procedure: Left Heart Cath and Cors/Grafts Angiography;  Surgeon: Burnell Blanks, MD;  Location: [facility];  Service: Cardiovascular;  Laterality: N/A;   CARDIAC CATHETERIZATION N/A 10/07/2015   Procedure: Coronary Stent Intervention;  Surgeon: Burnell Blanks, MD;  Location: [facility];  Service: Cardiovascular;  Laterality: N/A;   CARDIOVERSION N/A 05/25/2022   Procedure: CARDIOVERSION;  Surgeon: Berniece Salines, DO;  Location: [facility];  Service: Cardiovascular;  Laterality: N/A;   CATARACT EXTRACTION W/PHACO Left 10/21/2021   Procedure: CATARACT EXTRACTION PHACO AND INTRAOCULAR LENS PLACEMENT (IOC) LEFT 3.94 00:33.7;  Surgeon: Eulogio Bear, MD;  Location: [facility];  Service: Ophthalmology;  Laterality: Left;   CATARACT EXTRACTION W/PHACO Right 11/09/2021   Procedure: CATARACT EXTRACTION PHACO AND INTRAOCULAR LENS PLACEMENT (IOC) RIGHT;  Surgeon: Eulogio Bear, MD;  Location: [facility];  Service: Ophthalmology;  Laterality: Right;  3.59 0:29.4   CORONARY ANGIOPLASTY  "several"   CORONARY ANGIOPLASTY WITH STENT PLACEMENT  2005; 12/13/2014   "2; 1"   CORONARY ARTERY BYPASS GRAFT  1996   CABG X5   CORONARY ARTERY BYPASS GRAFT  March 2007   CABG X3   ESOPHAGOGASTRODUODENOSCOPY (EGD) WITH ESOPHAGEAL DILATION  2000   GREEN LIGHT LASER TURP  (TRANSURETHRAL RESECTION OF PROSTATE  2000's   "not cancerous"   HERNIA REPAIR     LAPAROSCOPIC CHOLECYSTECTOMY     LEFT HEART CATH AND CORS/GRAFTS ANGIOGRAPHY N/A 07/28/2017   Procedure: LEFT HEART CATH AND CORS/GRAFTS ANGIOGRAPHY;  Surgeon: Troy Sine, MD;  Location: [facility];  Service: Cardiovascular;  Laterality: N/A;   LUNG SURGERY  1996   "S/P CABG, had to put staple in lung after it had collapsed"   UMBILICAL HERNIA REPAIR     w/chole   Social History:  reports that he quit smoking about 35 years ago. His smoking use included cigarettes. He has a 60.00 pack-year smoking history. He has never used smokeless tobacco. He reports that he does not drink alcohol and does not use drugs.  Allergies  Allergen Reactions   Predicort [Prednisolone] Other (See Comments)    Stomach pain   Ciprofloxacin Other (See Comments)    GI upset   Hydrochlorothiazide Other (See Comments)    Dehydration   Hydrocodone Nausea Only and Other (See Comments)    Stomach upset    Hydrocodone-Acetaminophen Nausea Only    Stomach  upset   Sulfa Antibiotics Other (See Comments)    Cannot recall   Hydrocodone-Acetaminophen Nausea Only and Other (See Comments)    Stomach upset   Penicillins Hives, Rash and Other (See Comments)    Has patient had a PCN reaction causing immediate rash, facial/tongue/throat swelling, SOB or lightheadedness with hypotension: YES  Has patient had a PCN reaction causing severe rash involving mucus membranes or skin necrosis: NO  Has patient had a PCN reaction that required hospitalization NO  Has patient had a PCN reaction occurring within the last 10 years:NO  If all of the above answers are "NO", then may proceed with Cephalosporin use.  Has patient had a PCN reaction causing immediate rash, facial/tongue/throat swelling, SOB or lightheadedness with hypotension: YES Has patient had a PCN reaction causing severe rash involving mucus membranes or skin necrosis: NO  Has patient had a PCN reaction that required hospitalization NO Has patient had a PCN reaction occurring within the last 10 years:NO If all of the above answers are "NO", then may proceed with Cephalosporin use.    Family History  Problem Relation Age of Onset   Heart attack Mother        MI   Stroke Mother    Heart disease Mother    Hypertension Mother    Hyperlipidemia Mother    Asthma Mother    Heart disease Father    Rheumatic fever Father    Colon cancer Neg Hx     Prior to Admission medications   Medication Sig Start Date End Date Taking? Authorizing Provider  acetaminophen (TYLENOL) 650 MG CR tablet Take 1,300 mg by mouth at bedtime.    [provider]  allopurinol (ZYLOPRIM) 100 MG tablet TAKE 1 TABLET BY MOUTH EVERYDAY AT BEDTIME 01/11/22   Eulas Post, MD  amiodarone (PACERONE) 200 MG tablet Take 200 mg by mouth daily.    [provider]  apixaban (ELIQUIS) 5 MG TABS tablet Take 1 tablet (5 mg total) by mouth 2 (two) times daily. 09/09/21   Burnell Blanks, MD  atorvastatin (LIPITOR) 40 MG tablet Take 1 tablet (40 mg total) by mouth daily. Patient taking differently: Take 40 mg by mouth at bedtime. 01/11/22   Eulas Post, MD  Cholecalciferol (VITAMIN D-3) 125 MCG (5000 UT) TABS Take 2,000 Units by mouth daily.     [provider]  furosemide (LASIX) 40 MG tablet Take 40 mg by mouth daily in the afternoon. 06/02/22   [provider]  isosorbide mononitrate (IMDUR) 30 MG 24 hr tablet TAKE 1 TABLET BY MOUTH DAILY Patient taking differently: Take 30 mg by mouth daily. 01/08/22   Burnell Blanks, MD  levothyroxine (SYNTHROID) 75 MCG tablet Take 1 tablet (75 mcg total) by mouth daily before breakfast. 01/11/22   Eulas Post, MD  loratadine (CLARITIN) 10 MG tablet Take 10 mg by mouth daily as needed. Patient not taking: Reported on 07/08/2022    [provider]  metoprolol tartrate (LOPRESSOR) 100 MG tablet  TAKE 1 TABLET BY MOUTH TWICE  DAILY Patient taking differently: Take 100 mg by mouth 2 (two) times daily. 01/08/22   Burnell Blanks, MD  Multiple Vitamin (MULTIVITAMIN PO) Take 1 tablet by mouth daily.    [provider]  nitroGLYCERIN (NITROSTAT) 0.4 MG SL tablet Place 0.4 mg under the tongue every 5 (five) minutes as needed for chest pain (Up to 3 times).    [provider]  pantoprazole (PROTONIX) 40 MG  tablet TAKE 2 TABLETS BY MOUTH EVERY DAY Patient taking differently: Take 40 mg by mouth 2 (two) times daily. 12/21/21   Eulas Post, MD  polyethylene glycol (MIRALAX / GLYCOLAX) 17 g packet Take 17 g by mouth at bedtime.    [provider]  sodium bicarbonate 650 MG tablet Take 650 mg by mouth 2 (two) times daily.    [provider]    Physical Exam: Vitals:   07/15/22 0510 07/15/22 0511 07/15/22 0923 07/15/22 0924  BP: 129/75  133/71   Pulse: 99  91   Resp: 20  16   Temp: 97.9 F (36.6 C)   98 F (36.7 C)  TempSrc: Oral   Oral  SpO2: 95%  97%   Weight:  102.1 kg    Height:  5' 9"$  (1.753 m)     Physical Exam Vitals and nursing note reviewed.  Constitutional:      Appearance: He is obese.     Comments: Chronically ill-appearing  HENT:     Head: Normocephalic.  Eyes:     Comments: Pale conjunctiva  Cardiovascular:     Rate and Rhythm: Normal rate and regular rhythm.     Heart sounds: Normal heart sounds.  Pulmonary:     Effort: Pulmonary effort is normal.  Abdominal:     General: Bowel sounds are normal.     Palpations: Abdomen is soft.  Musculoskeletal:     Cervical back: Normal range of motion and neck supple.     Right lower leg: Edema present.     Left lower leg: Edema present.  Skin:    General: Skin is warm and dry.  Neurological:     General: No focal deficit present.     Mental Status: He is alert.  Psychiatric:        Mood and Affect: Mood normal.        Behavior: Behavior normal.     Data  Reviewed: Relevant notes from primary care and specialist visits, past discharge summaries as available in EHR, including Care Everywhere. Prior diagnostic testing as pertinent to current admission diagnoses Updated medications and problem lists for reconciliation ED course, including vitals, labs, imaging, treatment and response to treatment Triage notes, nursing and pharmacy notes and ED provider's notes Notable results as noted in HPI Labs reviewed.  PT 18.9, INR 1.6, BNP 684, sodium 141, potassium 4.1, chloride 107, bicarb 24, glucose 112, BUN 62, creatinine 4 0.15, calcium 9.2, troponin 13, white count 5.4, hemoglobin 9.9, hematocrit 32.1, platelet count 136 There are no new results to review at this time.  Assessment and Plan: Unstable angina ([place]) Patient with a significant history of coronary artery disease status post CABG x 2 as well as stent angioplasty who presents to the ER for evaluation of a 2-week history of intermittent chest pain not related to rest or exertion and an episode of chest pain that woke him up out of his sleep on the morning of presentation. Initial troponin is within normal limits We will cycle cardiac enzymes Continue aspirin, statins, nitrates, beta-blockers Continue heparin drip initiated in the ER Consult cardiology  Essential hypertension Blood pressure is stable Continue metoprolol  Coronary artery disease Treatment as outlined in 1  Paroxysmal atrial fibrillation (HCC) Status post cardioversion on 05/25/22 Continue amiodarone and metoprolol for rate control On Eliquis as primary prophylaxis for an acute stroke but currently on hold since patient is on a heparin drip  Hypothyroidism Stable Continue Synthroid  Chronic systolic CHF (  congestive heart failure) ([place]) Not acutely exacerbated Last known LVEF of 35 to 40% from a 2D echocardiogram which was done 12/23 with grade 2 diastolic dysfunction Increase furosemide to 80 mg p.o.  daily Continue metoprolol and nitrates Patient was on irbesartan which was discontinued during his last hospitalization due to hypotension Check daily weights   CKD stage 4 due to type 2 diabetes mellitus ([place]) Patient has diabetes mellitus type 2 with complications of stage IV chronic kidney disease. He continues to void Renal function appears to be at his baseline and will consult nephrology if worsening of his renal function is noted during this hospitalization      Advance Care Planning:   Code Status: Full Code   Consults: Cardiology  Family Communication: Greater than 50% of the time was spent discussing patient's condition and plan of care with him and his wife at the bedside.  All questions and concerns have been addressed.  They verbalized understanding and agree with the plan.  Severity of Illness: The status for this patient is OBSERVATION. Observation status is judged to be reasonable and necessary in order to provide the required intensity of service to ensure the patient's safety. The patient's presenting symptoms, physical exam findings, and initial radiographic and laboratory data in the context of their medical condition is felt to place them at decreased risk for further clinical deterioration. Furthermore, it is anticipated that the patient will be medically stable for discharge from the hospital within 2 midnights of admission.   Author: Collier Bullock, MD 07/15/2022 10:24 AM  For on call review [email or web address].

## 2022-07-15 NOTE — Assessment & Plan Note (Signed)
Not acutely exacerbated Last known LVEF of 35 to 40% from a 2D echocardiogram which was done 12/23 with grade 2 diastolic dysfunction Increase furosemide to 80 mg p.o. daily Continue metoprolol and nitrates Patient was on irbesartan which was discontinued during his last hospitalization due to hypotension Check daily weights

## 2022-07-15 NOTE — Assessment & Plan Note (Signed)
Patient with a significant history of coronary artery disease status post CABG x 2 as well as stent angioplasty who presents to the ER for evaluation of a 2-week history of intermittent chest pain not related to rest or exertion and an episode of chest pain that woke him up out of his sleep on the morning of presentation. Initial troponin is within normal limits We will cycle cardiac enzymes Continue aspirin, statins, nitrates, beta-blockers Continue heparin drip initiated in the ER Consult cardiology

## 2022-07-15 NOTE — Assessment & Plan Note (Signed)
Treatment as outlined in 1 

## 2022-07-15 NOTE — Progress Notes (Signed)
ANTICOAGULATION CONSULT NOTE - Initial Consult  Pharmacy Consult for Heparin  Indication: chest pain/ACS  Allergies  Allergen Reactions   Predicort [Prednisolone] Other (See Comments)    Stomach pain   Ciprofloxacin Other (See Comments)    GI upset   Hydrochlorothiazide Other (See Comments)    Dehydration   Hydrocodone Nausea Only and Other (See Comments)    Stomach upset    Hydrocodone-Acetaminophen Nausea Only    Stomach upset   Sulfa Antibiotics Other (See Comments)    Cannot recall   Hydrocodone-Acetaminophen Nausea Only and Other (See Comments)    Stomach upset   Penicillins Hives, Rash and Other (See Comments)    Has patient had a PCN reaction causing immediate rash, facial/tongue/throat swelling, SOB or lightheadedness with hypotension: YES  Has patient had a PCN reaction causing severe rash involving mucus membranes or skin necrosis: NO  Has patient had a PCN reaction that required hospitalization NO  Has patient had a PCN reaction occurring within the last 10 years:NO  If all of the above answers are "NO", then may proceed with Cephalosporin use.  Has patient had a PCN reaction causing immediate rash, facial/tongue/throat swelling, SOB or lightheadedness with hypotension: YES Has patient had a PCN reaction causing severe rash involving mucus membranes or skin necrosis: NO Has patient had a PCN reaction that required hospitalization NO Has patient had a PCN reaction occurring within the last 10 years:NO If all of the above answers are "NO", then may proceed with Cephalosporin use.    Patient Measurements: Height: 5' 9"$  (175.3 cm) Weight: 102.1 kg (225 lb) IBW/kg (Calculated) : 70.7 Heparin Dosing Weight: 92.5 kg   Vital Signs: Temp: 97.9 F (36.6 C) (02/15 0510) Temp Source: Oral (02/15 0510) BP: 129/75 (02/15 0510) Pulse Rate: 99 (02/15 0510)  Labs: Recent Labs    07/15/22 0516  HGB 9.9*  HCT 32.1*  PLT 136*    CrCl cannot be calculated (Patient's  most recent lab result is older than the maximum 21 days allowed.).   Medical History: Past Medical History:  Diagnosis Date   AAA (abdominal aortic aneurysm) ([place])    a. 3cm by [place] 2015.   Arthritis    "hips; back" (12/13/2014)   CAD (coronary artery disease) 2007   a. s/p CABG- IMA-LAD, VG-Cx, VG-RCA, VG-diag in 1999. B. sp redo CABG- VG-OM, VG-RCA in 2007 due to VG disease. c. NSTEMI 11/2014 s/p DES to SVG-OM from the Y graft.d. PTCA/DES x 1 distal body of SVG to Diagonal.09/2015   Chronic combined systolic and diastolic CHF (congestive heart failure) ([place])    a. remote EF 40-45% in 2006. b. Normal EF 2014. b. Echo 07/2016 EF 45-50%, grade 1 DD. c. Echo 2020 30% to 35%. Diffuse EF 30-35%, diffuse hypokinesis.   Chronic lower back pain    CKD (chronic kidney disease), stage IV (HCC)    COPD (chronic obstructive pulmonary disease) (HCC)    Deafness in left ear    Degenerative disc disease, lumbar    Diabetes mellitus, type 2 ([place]) 10/04/2014   Microalbumin 05/11/2012-100. Foot exam/monofilament 05/11/2012-normal.   Dilated cardiomyopathy ([place]) 10/07/2015   Emphysema    Esophageal stricture 07/02/1998   EGD   Genital candidiasis in male 10/25/2012   GERD (gastroesophageal reflux disease)    History of gout    "last flareup was in 2007" (12/13/2014)   History of hiatal hernia    Hyperlipidemia    Hypertension    Ischemic cardiomyopathy 2006   Echo 2020:  EF 30-35%, diffuse hypokinesis   NSTEMI (non-ST elevated myocardial infarction) ([place]) 12/13/2014   PVC's (premature ventricular contractions)    Renal artery stenosis ([place])    a. noted on CT 2008.   Type II diabetes mellitus (HCC)    Diet control    Unstable angina ([place]) 07/27/2017   Walking pneumonia 1990's   Wears dentures    full upper    Medications:  (Not in a hospital admission)   Assessment: Pharmacy consulted to dose heparin in this 77 year old male admitted with ACS/NSTEMI.  Pt was on Eliquis 5 mg PO BID , unsure of  last dose. CrCl = ?   Goal of Therapy:  INR 2-3 aPTT 66 - 102  Monitor platelets by anticoagulation protocol: Yes   Plan:  Give 4000 units bolus x 1 Start heparin infusion at 1200 units/hr - Will use aPTT to guide dosing until correlating with HL  - Will draw aPTT 8 hrs after start of heparin - Will check HL on 2/16 with AM Labs  Keysi Oelkers D 07/15/2022,6:11 AM

## 2022-07-15 NOTE — Assessment & Plan Note (Signed)
Stable Continue Synthroid 

## 2022-07-15 NOTE — Consult Note (Signed)
Cardiology Consultation:   Patient ID: Ronald [name]; LA:[id number]; 07/22/1945   Admit date: 07/15/2022 Date of Consult: 07/15/2022  Primary Care Provider: Glean Hess, MD Primary Cardiologist: Ronald [name] Primary Electrophysiologist:  None   Patient Profile:   Ronald [name] is a 77 y.o. male with a hx of CAD s/p CABG in 1996 (LIMA-LAD, SVG-circumflex, SVG-RCA, SVG-diagonal) with redo bypass in 2007 (SVG-OM, SVG-RCA) s/p subsequent multiple PCI, ischemic cardiomyopathy, PAF on Eliquis, CKD stage V, DM2, HTN, HLD, AAA, renal artery stenosis, COPD, and GERD who is being seen today for the evaluation of angina at the request of Dr. Francine [name].  History of Present Illness:   Ronald [name] was hospitalized at [facility] in 11/2014 with an NSTEMI. Cardiac cath showed severe disease in the SVG to OM, treated with a PCI/DES. He was treated with Brilinta post PCI, but developed dyspnea and was changed to Plavix. He was admitted to [facility] 09/2015, with unstable angina with cardiac cath showing severe disease in the distal body/anastomosis of the SVG to Diagonal, which was treated with PCI/DES. The LIMA to LAD was patent, SVG to OM was patent, SVG to PDA/PLA was patent. He was admitted to [name] in 07/2016 with chest pain. Troponin was negative x 3. He did not have an ischemic evaluation. Echo in 07/2016 showed mild LV systolic dysfunction with an LVEF of 45-50%. He was admitted to [facility] in 07/2017 with unstable angina and cardiac cath showed patency of all of the bypass grafts. Echo in 07/2017 showed  normal LV systolic function, [id number], grade 1 diastolic dysfunction. He was admitted to [name] in 05/2018 with atrial fib/flutter with RVR and was started on Xarelto, initially rate controlled with IV Cardizem. He was loaded with IV amiodarone and discharged home on po amiodarone and Lopressor. Echo in 05/2018 with LVEF=30-35% with mild MR. Abdominal u/s in 08/2020 showed a stable 3.1 cm AAA. He was seen in 04/2022 and was in atrial  flutter. Amiodarone was increased but he did not convert to sinus. He was cardioverted on 12/07/06/21. He was admitted to [facility] on 05/29/22 with dyspnea, chest pain and [name] edema. Echo 05/29/22 with LVEF of 35-40% with global hypokinesis-unchanged. Mild MR. Aortic valve sclerosis. He was diuresed. His irbesartan was stopped. He was seen by Ronald [name] while admitted. He was seen in the atrial fib clinic on 06/17/22 and was back in atrial fib. His amiodarone was lowered to 200 mg daily. Appt made with Ronald [name] to review options for further medical therapy as he is not felt to be a good candidate for ablation.  He was seen by his primary cardiologist on 07/07/2022 and was without symptoms of angina or cardiac decompensation.  He reported his acid reflux was waking him at night and was described as a foul taste in his mouth and frequent pain in his abdomen.  He presented to [facility] ED early this morning by private vehicle with a 2-week history of intermittent abdominal and chest pain that is more pronounced at nighttime and spontaneously resolves.  He indicates he was woken up around 4 AM this morning with worsening shortness of breath and orthopnea with associated abdominal and chest discomfort, and diaphoresis.  At baseline, he reports sleeping in a recliner secondary to back pain.  No palpitations, dizziness, presyncope, or syncope.  No lower extremity swelling.  He reports his weight has been stable at home since his recent [name] discharge ranging from 223 to 225 pounds.  Symptoms spontaneously resolved prior to arrival to  the ED.  Hemodynamically stable in the ED.  Initial high-sensitivity troponin negative.  BNP 684 which is improved from value 1 month prior.  Hgb 9.9 which appears to be is approximate baseline.  Potassium 4.1, BUN 62, serum creatinine 4.15.  Chest x-ray showed chronic cardiomegaly with vascular congestion.  EKG showed NSR, 99 bpm, left posterior fascicular block, inferolateral ST-T changes.  In the  ED, he was given aspirin as well as IV Lasix 40 mg x 2 and placed on a heparin drip.  Currently, patient feels back to his baseline and is without abdominal or chest pain.  He does not feel like he is volume up.  Both patient and wife indicate he is a scheduled to be evaluated by vascular surgery next week for peritoneal dialysis access.  He is concerned he may have another "blockage" as he reports his angina is typically associated with abdominal pain.    Past Medical History:  Diagnosis Date   AAA (abdominal aortic aneurysm) ([place])    a. 3cm by [place] 2015.   Arthritis    "hips; back" (12/13/2014)   CAD (coronary artery disease) 2007   a. s/p CABG- IMA-LAD, VG-Cx, VG-RCA, VG-diag in 1999. B. sp redo CABG- VG-OM, VG-RCA in 2007 due to VG disease. c. NSTEMI 11/2014 s/p DES to SVG-OM from the Y graft.d. PTCA/DES x 1 distal body of SVG to Diagonal.09/2015   Chronic combined systolic and diastolic CHF (congestive heart failure) ([place])    a. remote EF 40-45% in 2006. b. Normal EF 2014. b. Echo 07/2016 EF 45-50%, grade 1 DD. c. Echo 2020 30% to 35%. Diffuse EF 30-35%, diffuse hypokinesis.   Chronic lower back pain    CKD (chronic kidney disease), stage IV (HCC)    COPD (chronic obstructive pulmonary disease) (HCC)    Deafness in left ear    Degenerative disc disease, lumbar    Diabetes mellitus, type 2 ([place]) 10/04/2014   Microalbumin 05/11/2012-100. Foot exam/monofilament 05/11/2012-normal.   Dilated cardiomyopathy ([place]) 10/07/2015   Emphysema    Esophageal stricture 07/02/1998   EGD   Genital candidiasis in male 10/25/2012   GERD (gastroesophageal reflux disease)    History of gout    "last flareup was in 2007" (12/13/2014)   History of hiatal hernia    Hyperlipidemia    Hypertension    Ischemic cardiomyopathy 2006   Echo 2020: EF 30-35%, diffuse hypokinesis   NSTEMI (non-ST elevated myocardial infarction) ([place]) 12/13/2014   PVC's (premature ventricular contractions)    Renal artery stenosis  ([place])    a. noted on CT 2008.   Type II diabetes mellitus ([place])    Diet control    Unstable angina ([place]) 07/27/2017   Walking pneumonia 1990's   Wears dentures    full upper    Past Surgical History:  Procedure Laterality Date   CARDIAC CATHETERIZATION  "several"   CARDIAC CATHETERIZATION N/A 12/13/2014   Procedure: Left Heart Cath and Coronary Angiography;  Surgeon: Jettie Booze, MD;  Location: [place] [facility];  Service: Cardiovascular;  Laterality: N/A;   CARDIAC CATHETERIZATION  12/13/2014   Procedure: Coronary Stent Intervention;  Surgeon: Jettie Booze, MD;  Location: [place] [facility];  Service: Cardiovascular;;   CARDIAC CATHETERIZATION N/A 10/07/2015   Procedure: Left Heart Cath and Cors/Grafts Angiography;  Surgeon: Burnell Blanks, MD;  Location: [place] [facility];  Service: Cardiovascular;  Laterality: N/A;   CARDIAC CATHETERIZATION N/A 10/07/2015   Procedure: Coronary Stent Intervention;  Surgeon: Annita Brod  Ronald Form, MD;  Location: [place] [facility];  Service: Cardiovascular;  Laterality: N/A;   CARDIOVERSION N/A 05/25/2022   Procedure: CARDIOVERSION;  Surgeon: Berniece Salines, DO;  Location: [place];  Service: Cardiovascular;  Laterality: N/A;   CATARACT EXTRACTION W/PHACO Left 10/21/2021   Procedure: CATARACT EXTRACTION PHACO AND INTRAOCULAR LENS PLACEMENT (IOC) LEFT 3.94 00:33.7;  Surgeon: Eulogio Bear, MD;  Location: [place];  Service: Ophthalmology;  Laterality: Left;   CATARACT EXTRACTION W/PHACO Right 11/09/2021   Procedure: CATARACT EXTRACTION PHACO AND INTRAOCULAR LENS PLACEMENT (IOC) RIGHT;  Surgeon: Eulogio Bear, MD;  Location: [place];  Service: Ophthalmology;  Laterality: Right;  3.59 0:29.4   CORONARY ANGIOPLASTY  "several"   CORONARY ANGIOPLASTY WITH STENT PLACEMENT  2005; 12/13/2014   "2; 1"   CORONARY ARTERY BYPASS GRAFT  1996   CABG X5   CORONARY ARTERY BYPASS GRAFT  March 2007   CABG X3    ESOPHAGOGASTRODUODENOSCOPY (EGD) WITH ESOPHAGEAL DILATION  2000   GREEN LIGHT LASER TURP (TRANSURETHRAL RESECTION OF PROSTATE  2000's   "not cancerous"   HERNIA REPAIR     LAPAROSCOPIC CHOLECYSTECTOMY     LEFT HEART CATH AND CORS/GRAFTS ANGIOGRAPHY N/A 07/28/2017   Procedure: LEFT HEART CATH AND CORS/GRAFTS ANGIOGRAPHY;  Surgeon: Troy Sine, MD;  Location: [place] [facility];  Service: Cardiovascular;  Laterality: N/A;   LUNG SURGERY  1996   "S/P CABG, had to put staple in lung after it had collapsed"   UMBILICAL HERNIA REPAIR     w/chole     Home Meds: Prior to Admission medications   Medication Sig Start Date End Date Taking? Authorizing Provider  acetaminophen (TYLENOL) 650 MG CR tablet Take 1,300 mg by mouth at bedtime.   Yes [provider]  allopurinol (ZYLOPRIM) 100 MG tablet TAKE 1 TABLET BY MOUTH EVERYDAY AT BEDTIME 01/11/22  Yes Eulas Post, MD  amiodarone (PACERONE) 200 MG tablet Take 200 mg by mouth daily.   Yes [provider]  apixaban (ELIQUIS) 5 MG TABS tablet Take 1 tablet (5 mg total) by mouth 2 (two) times daily. 09/09/21  Yes Burnell Blanks, MD  atorvastatin (LIPITOR) 40 MG tablet Take 1 tablet (40 mg total) by mouth daily. Patient taking differently: Take 40 mg by mouth at bedtime. 01/11/22  Yes Eulas Post, MD  Cholecalciferol (VITAMIN D-3) 125 MCG (5000 UT) TABS Take 2,000 Units by mouth daily.    Yes [provider]  furosemide (LASIX) 40 MG tablet Take 40 mg by mouth daily.   Yes [provider]  isosorbide mononitrate (IMDUR) 30 MG 24 hr tablet TAKE 1 TABLET BY MOUTH DAILY Patient taking differently: Take 30 mg by mouth daily. 01/08/22  Yes Burnell Blanks, MD  levothyroxine (SYNTHROID) 75 MCG tablet Take 1 tablet (75 mcg total) by mouth daily before breakfast. 01/11/22  Yes Eulas Post, MD  metoprolol tartrate (LOPRESSOR) 100 MG tablet TAKE 1 TABLET BY MOUTH TWICE  DAILY Patient taking  differently: Take 100 mg by mouth 2 (two) times daily. 01/08/22  Yes Burnell Blanks, MD  Multiple Vitamin (MULTIVITAMIN PO) Take 1 tablet by mouth daily.   Yes [provider]  nitroGLYCERIN (NITROSTAT) 0.4 MG SL tablet Place 0.4 mg under the tongue every 5 (five) minutes as needed for chest pain (Up to 3 times).   Yes [provider]  pantoprazole (PROTONIX) 40 MG tablet TAKE 2 TABLETS BY MOUTH EVERY DAY Patient taking differently: Take 40  mg by mouth 2 (two) times daily. 12/21/21  Yes Eulas Post, MD  polyethylene glycol (MIRALAX / GLYCOLAX) 17 g packet Take 17 g by mouth at bedtime.   Yes [provider]  sodium bicarbonate 650 MG tablet Take 650 mg by mouth 2 (two) times daily.   Yes [provider]    Inpatient Medications: Scheduled Meds:  acetaminophen  1,000 mg Oral QHS   allopurinol  100 mg Oral QHS   amiodarone  200 mg Oral Daily   [START ON 07/16/2022] aspirin EC  81 mg Oral Daily   atorvastatin  40 mg Oral QHS   cholecalciferol  2,000 Units Oral Daily   [START ON 07/16/2022] furosemide  80 mg Oral Daily   isosorbide mononitrate  30 mg Oral Once   [START ON 07/16/2022] isosorbide mononitrate  60 mg Oral Daily   [START ON 07/16/2022] levothyroxine  75 mcg Oral Q0600   metoprolol tartrate  100 mg Oral BID   multivitamin with minerals  1 tablet Oral Daily   pantoprazole  40 mg Oral BID   sodium bicarbonate  650 mg Oral BID   Continuous Infusions:  heparin 1,200 Units/hr (07/15/22 [id number])   PRN Meds: nitroGLYCERIN, ondansetron (ZOFRAN) IV, polyethylene glycol  Allergies:   Allergies  Allergen Reactions   Predicort [Prednisolone] Other (See Comments)    Stomach pain   Ciprofloxacin Other (See Comments)    GI upset   Hydrochlorothiazide Other (See Comments)    Dehydration   Hydrocodone Nausea Only and Other (See Comments)    Stomach upset    Hydrocodone-Acetaminophen Nausea Only    Stomach upset   Sulfa Antibiotics Other  (See Comments)    Cannot recall   Hydrocodone-Acetaminophen Nausea Only and Other (See Comments)    Stomach upset   Penicillins Hives, Rash and Other (See Comments)    Has patient had a PCN reaction causing immediate rash, facial/tongue/throat swelling, SOB or lightheadedness with hypotension: YES  Has patient had a PCN reaction causing severe rash involving mucus membranes or skin necrosis: NO  Has patient had a PCN reaction that required hospitalization NO  Has patient had a PCN reaction occurring within the last 10 years:NO  If all of the above answers are "NO", then may proceed with Cephalosporin use.  Has patient had a PCN reaction causing immediate rash, facial/tongue/throat swelling, SOB or lightheadedness with hypotension: YES Has patient had a PCN reaction causing severe rash involving mucus membranes or skin necrosis: NO Has patient had a PCN reaction that required hospitalization NO Has patient had a PCN reaction occurring within the last 10 years:NO If all of the above answers are "NO", then may proceed with Cephalosporin use.    Social History:   Social History   Socioeconomic History   Marital status: Married    Spouse name: Stevey Demary   Number of children: 2   Years of education: Not on file   Highest education level: 8th grade  Occupational History   Occupation: Retired  Tobacco Use   Smoking status: Former    Packs/day: 3.00    Years: 20.00    Total pack years: 60.00    Types: Cigarettes    Quit date: 07/25/1986    Years since quitting: 35.9   Smokeless tobacco: Never  Vaping Use   Vaping Use: Never used  Substance and Sexual Activity   Alcohol use: No    Alcohol/week: 0.0 standard drinks of alcohol   Drug use: No   Sexual activity:  Not Currently  Other Topics Concern   Not on file  Social History Narrative   Did auto salvage work.   Lives at home with his wife.  Independent at baseline.   Social Determinants of Health   Financial Resource  Strain: Low Risk  (06/25/2022)   Overall Financial Resource Strain (CARDIA)    Difficulty of Paying Living Expenses: Not hard at all  Food Insecurity: No Food Insecurity (06/25/2022)   Hunger Vital Sign    Worried About Running Out of Food in the Last Year: Never true    Ran Out of Food in the Last Year: Never true  Transportation Needs: No Transportation Needs (06/25/2022)   PRAPARE - Hydrologist (Medical): No    Lack of Transportation (Non-Medical): No  Physical Activity: Inactive (06/25/2022)   Exercise Vital Sign    Days of Exercise per Week: 0 days    Minutes of Exercise per Session: 0 min  Stress: No Stress Concern Present (06/25/2022)   [place]    Feeling of Stress : Not at all  Social Connections: Moderately Isolated (06/25/2022)   Social Connection and Isolation Panel [NHANES]    Frequency of Communication with Friends and Family: Twice a week    Frequency of Social Gatherings with Friends and Family: Once a week    Attends Religious Services: Never    Marine scientist or Organizations: No    Attends Archivist Meetings: Never    Marital Status: Married  Human resources officer Violence: Not At Risk (06/25/2022)   Humiliation, Afraid, Rape, and Kick questionnaire    Fear of Current or Ex-Partner: No    Emotionally Abused: No    Physically Abused: No    Sexually Abused: No     Family History:   Family History  Problem Relation Age of Onset   Heart attack Mother        MI   Stroke Mother    Heart disease Mother    Hypertension Mother    Hyperlipidemia Mother    Asthma Mother    Heart disease Father    Rheumatic fever Father    Colon cancer Neg Hx     ROS:  Review of Systems  Constitutional:  Positive for malaise/fatigue. Negative for chills, diaphoresis, fever and weight loss.  HENT:  Negative for congestion.   Eyes:  Negative for discharge and redness.   Respiratory:  Positive for shortness of breath. Negative for cough, sputum production and wheezing.   Cardiovascular:  Positive for chest pain and orthopnea. Negative for palpitations, claudication, leg swelling and PND.  Gastrointestinal:  Positive for abdominal pain. Negative for blood in stool, heartburn, melena, nausea and vomiting.  Musculoskeletal:  Negative for falls and myalgias.  Skin:  Negative for rash.  Neurological:  Negative for dizziness, tingling, tremors, sensory change, speech change, focal weakness, loss of consciousness and weakness.  Endo/Heme/Allergies:  Does not bruise/bleed easily.  Psychiatric/Behavioral:  Negative for substance abuse. The patient is not nervous/anxious.   All other systems reviewed and are negative.     Physical Exam/Data:   Vitals:   07/15/22 0511 07/15/22 0923 07/15/22 0924 07/15/22 1118  BP:  133/71  137/78  Pulse:  91  90  Resp:  16    Temp:   98 F (36.7 C)   TempSrc:   Oral   SpO2:  97%    Weight: 102.1 kg     Height:  $5' 9"R$  (1.753 m)      No intake or output data in the 24 hours ending 07/15/22 1245 Filed Weights   07/15/22 0511  Weight: 102.1 kg   Body mass index is 33.23 kg/m.   Physical Exam: General: Well developed, well nourished, in no acute distress. Head: Normocephalic, atraumatic, sclera non-icteric, no xanthomas, nares without discharge.  Neck: Negative for carotid bruits. JVD not elevated. Lungs: Clear bilaterally to auscultation without wheezes, rales, or rhonchi. Breathing is unlabored. Heart: RRR with S1 S2. No murmurs, rubs, or gallops appreciated. Abdomen: Soft, non-tender, non-distended with normoactive bowel sounds. No hepatomegaly. No rebound/guarding. No obvious abdominal masses. Msk:  Strength and tone appear normal for age. Extremities: No clubbing or cyanosis. No edema. Distal pedal pulses are 2+ and equal bilaterally. Neuro: Alert and oriented X 3. No facial asymmetry. No focal deficit. Moves all  extremities spontaneously. Psych:  Responds to questions appropriately with a normal affect.   EKG:  The EKG was personally reviewed and demonstrates: NSR, 99 bpm, left posterior fascicular block, inferolateral ST-T changes. Telemetry:  Telemetry was personally reviewed and demonstrates: SR  Weights: [employer]   07/15/22 0511  Weight: 102.1 kg    Relevant CV Studies:  2D echo 05/29/2022: 1. Left ventricular ejection fraction, by estimation, is 35 to 40%. The  left ventricle has moderately decreased function. The left ventricle  demonstrates global hypokinesis. The left ventricular internal cavity size  was mildly dilated. Left ventricular  diastolic parameters are consistent with Grade II diastolic dysfunction  (pseudonormalization).   2. Right ventricular systolic function is mildly reduced. The right  ventricular size is normal. There is normal pulmonary artery systolic  pressure. The estimated right ventricular systolic pressure is 123456 mmHg.   3. The mitral valve is normal in structure. Mild mitral valve  regurgitation. No evidence of mitral stenosis.   4. Tricuspid valve regurgitation is mild to moderate.   5. The aortic valve has an indeterminant number of cusps. There is  moderate calcification of the aortic valve. Aortic valve regurgitation is  not visualized. Aortic valve sclerosis/calcification is present, without  any evidence of aortic stenosis.   6. The inferior vena cava is normal in size with greater than 50%  respiratory variability, suggesting right atrial pressure of 3 mmHg.  __________  LHC 07/28/2017: Ost RCA to Prox RCA lesion is 95% stenosed. Prox RCA lesion is 100% stenosed. Prox Cx lesion is 100% stenosed. Prox LAD lesion is 100% stenosed. Origin lesion is 100% stenosed. Origin lesion is 100% stenosed. Previously placed Dist Graft to Insertion stent (unknown type) is widely patent. Origin to Prox Graft lesion is 10% stenosed. Prox Graft lesion  is 30% stenosed. Mid Graft to Dist Graft lesion is 30% stenosed. Mid Graft lesion is 50% stenosed.   Significant native CAD with total occlusion of the LAD after the takeoff of the first diagonal vessel; total occlusion of the proximal left circumflex coronary artery; and total occlusion of the proximal RCA with antegrade bridging collaterals.   Patent LIMA to LAD.   Patent Y vein graft from the 1996 surgery which supplies the diagonal vessel and distal circumflex marginal vessel.  There is diffuse narrowing of 50% in the midportion of the Y graft supplying the diagonal vessel and a patent distal stent extending to the ostium of the diagonal.  The Y graft supplying the distal marginal is free of significant disease and has mild luminal irregularity.   Occluded vein graft which had supplied the distal  RCA from the 1996 surgery.   Patent SVG supplying the distal RCA from the 2007 surgery with a stent in the proximal portion of the graft with 30% narrowings in the proximal and mid segment.   Occluded vein graft from 2007 which appears to also have been stented.   LVEDP 18 mm   RECOMMENDATION: Increase medical therapy.   Laboratory Data:  Chemistry Recent Labs  Lab 07/15/22 0516  NA 141  K 4.1  CL 107  CO2 24  GLUCOSE 112*  BUN 62*  CREATININE 4.15*  CALCIUM 9.2  GFRNONAA 14*  ANIONGAP 10    No results for input(s): "PROT", "ALBUMIN", "AST", "ALT", "ALKPHOS", "BILITOT" in the last 168 hours. Hematology Recent Labs  Lab 07/15/22 0516  WBC 5.4  RBC 3.65*  HGB 9.9*  HCT 32.1*  MCV 87.9  MCH 27.1  MCHC 30.8  RDW 17.1*  PLT 136*   Cardiac EnzymesNo results for input(s): "TROPONINI" in the last 168 hours. No results for input(s): "TROPIPOC" in the last 168 hours.  BNP Recent Labs  Lab 07/15/22 0516  BNP 684.9*    DDimer No results for input(s): "DDIMER" in the last 168 hours.  Radiology/Studies:  DG Chest 1 View  Result Date: 07/15/2022 IMPRESSION: Chronic  cardiomegaly with vascular congestion. Electronically Signed   By: Jorje Guild M.D.   On: 07/15/2022 05:36    Assessment and Plan:   1.  CAD status post CABG status post redo CABG status post subsequent PCI with unstable angina: -Currently without symptoms of angina or cardiac decompensation -Initial high-sensitivity troponin negative, cycle to rule out -Heparin drip for now given he is on apixaban in the outpatient setting, until it is clear he will not require inpatient invasive testing -Recent echo showed stable cardiomyopathy -Overall, this is not a straightforward decision given his degree of underlying CAD and significant CKD -He reports his symptoms are consistent with his prior angina including abdominal and chest discomfort -We discussed evaluation and management options including medical therapy only, pharmacologic MPI, and diagnostic cardiac cath -He declines stress testing -Trial of increasing Imdur to 60 mg daily while we let apixaban washout -Ideally, would like to defer cardiac cath given normal troponins and in the setting of CKD stage V -PTA Lopressor and atorvastatin  2.  HFrEF secondary to ICM: -Euvolemic and well compensated -PTA Lopressor -Not on ACE inhibitor/ARB/ARNI/SGLT2 inhibitor/MRA secondary to advanced CKD -Gentle diuresis  3.  PAF: -Maintaining sinus rhythm -PTA Lopressor -Apixaban on hold until it is clear he will not require inpatient invasive testing -Heparin drip for now -CHA2DS2-VASc at least 6 (CHF, HTN, age x 2, DM, vascular disease) -At time of discharge, resume PTA apixaban 5 mg twice daily, he does not meet reduced dosing criteria  4.  CKD stage V: -Management per IM and nephrology  5.  Anemia of chronic disease: -Hemoglobin stable  6.  AAA: -Stable on most recent ultrasound -Outpatient follow-up  7.  HTN: -BP stable  8.  HLD: -PTA atorvastatin  9.  OSA: -CPAP  10. GERD: -Possibly contributing to presentation          For questions or updates, please contact [facility] Please consult [email or web address] for contact info under Cardiology/STEMI.   Signed, Christell Faith, PA-C [facility] Pager: [phone] 07/15/2022, 12:45 PM

## 2022-07-15 NOTE — Assessment & Plan Note (Signed)
Blood pressure is stable Continue metoprolol

## 2022-07-15 NOTE — ED Provider Notes (Signed)
[facility] Provider Note    Event Date/Time   First MD Initiated Contact with Patient 07/15/22 [phone]     (approximate)   History   Chest Pain   HPI  Ronald [name] is a 77 y.o. male with history of A-fib on Eliquis, CAD status post CABG 1996 and 2007, CHF, COPD, chronic kidney disease, hypertension, hyperlipidemia, diabetes, AAA who presents emergency department with 2 weeks of chest tightness, shortness of breath.  States most of the time the episodes would only last for few seconds but has progressively gotten worse.  Normally feels it with exertion but tonight it came on at rest and woke him from sleep.  He became diaphoretic with it.  No nausea, vomiting, dizziness, fevers, cough, lower extremity swelling or pain.  Patient currently asymptomatic.   Last catheterization was in 2019 and showed multivessel coronary artery disease and was managed medically.   History provided by patient, wife.    Past Medical History:  Diagnosis Date   AAA (abdominal aortic aneurysm) ([place])    a. 3cm by [place] 2015.   Arthritis    "hips; back" (12/13/2014)   CAD (coronary artery disease) 2007   a. s/p CABG- IMA-LAD, VG-Cx, VG-RCA, VG-diag in 1999. B. sp redo CABG- VG-OM, VG-RCA in 2007 due to VG disease. c. NSTEMI 11/2014 s/p DES to SVG-OM from the Y graft.d. PTCA/DES x 1 distal body of SVG to Diagonal.09/2015   Chronic combined systolic and diastolic CHF (congestive heart failure) ([place])    a. remote EF 40-45% in 2006. b. Normal EF 2014. b. Echo 07/2016 EF 45-50%, grade 1 DD. c. Echo 2020 30% to 35%. Diffuse EF 30-35%, diffuse hypokinesis.   Chronic lower back pain    CKD (chronic kidney disease), stage IV (HCC)    COPD (chronic obstructive pulmonary disease) (HCC)    Deafness in left ear    Degenerative disc disease, lumbar    Diabetes mellitus, type 2 ([place]) 10/04/2014   Microalbumin 05/11/2012-100. Foot exam/monofilament 05/11/2012-normal.   Dilated cardiomyopathy ([place])  10/07/2015   Emphysema    Esophageal stricture 07/02/1998   EGD   Genital candidiasis in male 10/25/2012   GERD (gastroesophageal reflux disease)    History of gout    "last flareup was in 2007" (12/13/2014)   History of hiatal hernia    Hyperlipidemia    Hypertension    Ischemic cardiomyopathy 2006   Echo 2020: EF 30-35%, diffuse hypokinesis   NSTEMI (non-ST elevated myocardial infarction) ([place]) 12/13/2014   PVC's (premature ventricular contractions)    Renal artery stenosis ([place])    a. noted on CT 2008.   Type II diabetes mellitus ([place])    Diet control    Unstable angina ([place]) 07/27/2017   Walking pneumonia 1990's   Wears dentures    full upper    Past Surgical History:  Procedure Laterality Date   CARDIAC CATHETERIZATION  "several"   CARDIAC CATHETERIZATION N/A 12/13/2014   Procedure: Left Heart Cath and Coronary Angiography;  Surgeon: Jettie Booze, MD;  Location: [place] [facility];  Service: Cardiovascular;  Laterality: N/A;   CARDIAC CATHETERIZATION  12/13/2014   Procedure: Coronary Stent Intervention;  Surgeon: Jettie Booze, MD;  Location: [place] [facility];  Service: Cardiovascular;;   CARDIAC CATHETERIZATION N/A 10/07/2015   Procedure: Left Heart Cath and Cors/Grafts Angiography;  Surgeon: Burnell Blanks, MD;  Location: [place] [facility];  Service: Cardiovascular;  Laterality: N/A;   CARDIAC CATHETERIZATION N/A 10/07/2015  Procedure: Coronary Stent Intervention;  Surgeon: Burnell Blanks, MD;  Location: [place] [facility];  Service: Cardiovascular;  Laterality: N/A;   CARDIOVERSION N/A 05/25/2022   Procedure: CARDIOVERSION;  Surgeon: Berniece Salines, DO;  Location: [place];  Service: Cardiovascular;  Laterality: N/A;   CATARACT EXTRACTION W/PHACO Left 10/21/2021   Procedure: CATARACT EXTRACTION PHACO AND INTRAOCULAR LENS PLACEMENT (IOC) LEFT 3.94 00:33.7;  Surgeon: Eulogio Bear, MD;  Location: [place];  Service:  Ophthalmology;  Laterality: Left;   CATARACT EXTRACTION W/PHACO Right 11/09/2021   Procedure: CATARACT EXTRACTION PHACO AND INTRAOCULAR LENS PLACEMENT (IOC) RIGHT;  Surgeon: Eulogio Bear, MD;  Location: [place];  Service: Ophthalmology;  Laterality: Right;  3.59 0:29.4   CORONARY ANGIOPLASTY  "several"   CORONARY ANGIOPLASTY WITH STENT PLACEMENT  2005; 12/13/2014   "2; 1"   CORONARY ARTERY BYPASS GRAFT  1996   CABG X5   CORONARY ARTERY BYPASS GRAFT  March 2007   CABG X3   ESOPHAGOGASTRODUODENOSCOPY (EGD) WITH ESOPHAGEAL DILATION  2000   GREEN LIGHT LASER TURP (TRANSURETHRAL RESECTION OF PROSTATE  2000's   "not cancerous"   HERNIA REPAIR     LAPAROSCOPIC CHOLECYSTECTOMY     LEFT HEART CATH AND CORS/GRAFTS ANGIOGRAPHY N/A 07/28/2017   Procedure: LEFT HEART CATH AND CORS/GRAFTS ANGIOGRAPHY;  Surgeon: Troy Sine, MD;  Location: [place] [facility];  Service: Cardiovascular;  Laterality: N/A;   LUNG SURGERY  1996   "S/P CABG, had to put staple in lung after it had collapsed"   UMBILICAL HERNIA REPAIR     w/chole    MEDICATIONS:  Prior to Admission medications   Medication Sig Start Date End Date Taking? Authorizing Provider  acetaminophen (TYLENOL) 650 MG CR tablet Take 1,300 mg by mouth at bedtime.    [provider]  allopurinol (ZYLOPRIM) 100 MG tablet TAKE 1 TABLET BY MOUTH EVERYDAY AT BEDTIME 01/11/22   Eulas Post, MD  amiodarone (PACERONE) 200 MG tablet Take 200 mg by mouth daily.    [provider]  apixaban (ELIQUIS) 5 MG TABS tablet Take 1 tablet (5 mg total) by mouth 2 (two) times daily. 09/09/21   Burnell Blanks, MD  atorvastatin (LIPITOR) 40 MG tablet Take 1 tablet (40 mg total) by mouth daily. Patient taking differently: Take 40 mg by mouth at bedtime. 01/11/22   Eulas Post, MD  Cholecalciferol (VITAMIN D-3) 125 MCG (5000 UT) TABS Take 2,000 Units by mouth daily.     [provider]  furosemide (LASIX) 40 MG  tablet Take 40 mg by mouth daily in the afternoon. 06/02/22   [provider]  isosorbide mononitrate (IMDUR) 30 MG 24 hr tablet TAKE 1 TABLET BY MOUTH DAILY Patient taking differently: Take 30 mg by mouth daily. 01/08/22   Burnell Blanks, MD  levothyroxine (SYNTHROID) 75 MCG tablet Take 1 tablet (75 mcg total) by mouth daily before breakfast. 01/11/22   Eulas Post, MD  loratadine (CLARITIN) 10 MG tablet Take 10 mg by mouth daily as needed. Patient not taking: Reported on 07/08/2022    [provider]  metoprolol tartrate (LOPRESSOR) 100 MG tablet TAKE 1 TABLET BY MOUTH TWICE  DAILY Patient taking differently: Take 100 mg by mouth 2 (two) times daily. 01/08/22   Burnell Blanks, MD  Multiple Vitamin (MULTIVITAMIN PO) Take 1 tablet by mouth daily.    [provider]  nitroGLYCERIN (NITROSTAT) 0.4 MG SL tablet Place 0.4 mg under the tongue every  5 (five) minutes as needed for chest pain (Up to 3 times).    [provider]  pantoprazole (PROTONIX) 40 MG tablet TAKE 2 TABLETS BY MOUTH EVERY DAY Patient taking differently: Take 40 mg by mouth 2 (two) times daily. 12/21/21   Eulas Post, MD  polyethylene glycol (MIRALAX / GLYCOLAX) 17 g packet Take 17 g by mouth at bedtime.    [provider]  sodium bicarbonate 650 MG tablet Take 650 mg by mouth 2 (two) times daily.    [provider]    Physical Exam   Triage Vital Signs: ED Triage Vitals  Enc Vitals Group     BP 07/15/22 0510 129/75     Pulse Rate 07/15/22 0510 99     Resp 07/15/22 0510 20     Temp 07/15/22 0510 97.9 F (36.6 C)     Temp Source 07/15/22 0510 Oral     SpO2 07/15/22 0510 95 %     Weight 07/15/22 0511 225 lb (102.1 kg)     Height 07/15/22 0511 5' 9"$  (1.753 m)     Head Circumference --      Peak Flow --      Pain Score 07/15/22 0510 0     Pain Loc --      Pain Edu? --      Excl. in [place]? --     Most recent vital signs: Vitals:   07/15/22 0510   BP: 129/75  Pulse: 99  Resp: 20  Temp: 97.9 F (36.6 C)  SpO2: 95%    CONSTITUTIONAL: Alert, responds appropriately to questions. Well-appearing; well-nourished, elderly HEAD: Normocephalic, atraumatic EYES: Conjunctivae clear, pupils appear equal, sclera nonicteric ENT: normal nose; moist mucous membranes NECK: Supple, normal ROM CARD: RRR; S1 and S2 appreciated RESP: Normal chest excursion without splinting or tachypnea; breath sounds clear and equal bilaterally; no wheezes, no rhonchi, no rales, no hypoxia or respiratory distress, speaking full sentences ABD/GI: Non-distended; soft, non-tender, no rebound, no guarding, no peritoneal signs BACK: The back appears normal EXT: Normal ROM in all joints; no deformity noted, no edema, no calf tenderness or calf swelling SKIN: Normal color for age and race; warm; no rash on exposed skin NEURO: Moves all extremities equally, normal speech PSYCH: The patient's mood and manner are appropriate.   ED Results / Procedures / Treatments   LABS: (all labs ordered are listed, but only abnormal results are displayed) Labs Reviewed  CBC - Abnormal; Notable for the following components:      Result Value   RBC 3.65 (*)    Hemoglobin 9.9 (*)    HCT 32.1 (*)    RDW 17.1 (*)    Platelets 136 (*)    All other components within normal limits  BASIC METABOLIC PANEL - Abnormal; Notable for the following components:   Glucose, Bld 112 (*)    BUN 62 (*)    Creatinine, Ser 4.15 (*)    GFR, Estimated 14 (*)    All other components within normal limits  BRAIN NATRIURETIC PEPTIDE - Abnormal; Notable for the following components:   B Natriuretic Peptide 684.9 (*)    All other components within normal limits  HEPARIN LEVEL (UNFRACTIONATED) - Abnormal; Notable for the following components:   Heparin Unfractionated >1.10 (*)    All other components within normal limits  APTT - Abnormal; Notable for the following components:   aPTT 37 (*)    All  other components within normal limits  PROTIME-INR - Abnormal;  Notable for the following components:   Prothrombin Time 18.9 (*)    INR 1.6 (*)    All other components within normal limits  APTT  TROPONIN I (HIGH SENSITIVITY)  TROPONIN I (HIGH SENSITIVITY)     EKG:  EKG Interpretation  Date/Time:  Thursday July 15 2022 05:16:09 EST Ventricular Rate:  99 PR Interval:  172 QRS Duration: 122 QT Interval:  388 QTC Calculation: 497 R Axis:   117 Text Interpretation: Normal sinus rhythm Left posterior fascicular block ST & T wave abnormality, consider inferolateral ischemia Abnormal ECG When compared with ECG of 17-Jun-2022 14:02, Sinus rhythm has replaced Atrial fibrillation Left posterior fascicular block is now Present Confirmed by Pryor Curia [id number]) on 07/15/2022 5:47:24 AM         RADIOLOGY: My personal review and interpretation of imaging: Chest x-ray shows vascular congestion, cardiomegaly.  I have personally reviewed all radiology reports.   DG Chest 1 View  Result Date: 07/15/2022 CLINICAL DATA:  Chest pain EXAM: CHEST  1 VIEW COMPARISON:  05/28/2022 FINDINGS: Cardiomegaly. Mediastinal contours are distorted by rotation. Diffuse interstitial coarsening with congested appearance of central vessels. No focal airspace disease. Prior CABG. No effusion or pneumothorax is seen. IMPRESSION: Chronic cardiomegaly with vascular congestion. Electronically Signed   By: Jorje Guild M.D.   On: 07/15/2022 05:36     PROCEDURES:  Critical Care performed: Yes, see critical care procedure note(s)   CRITICAL CARE Performed by: Cyril Mourning Irisa Grimsley   Total critical care time: 40 minutes  Critical care time was exclusive of separately billable procedures and treating other patients.  Critical care was necessary to treat or prevent imminent or life-threatening deterioration.  Critical care was time spent personally by me on the following activities: development of treatment plan  with patient and/or surrogate as well as nursing, discussions with consultants, evaluation of patient's response to treatment, examination of patient, obtaining history from patient or surrogate, ordering and performing treatments and interventions, ordering and review of laboratory studies, ordering and review of radiographic studies, pulse oximetry and re-evaluation of patient's condition.   [name]1-3 Lead EKG Interpretation  Performed by: Nijee Heatwole, Delice Bison, DO Authorized by: Macari Zalesky, Delice Bison, DO     Interpretation: normal     ECG rate:  99   ECG rate assessment: normal     Rhythm: sinus rhythm     Ectopy: none     Conduction: normal       IMPRESSION / MDM / ASSESSMENT AND PLAN / ED COURSE  I reviewed the triage vital signs and the nursing notes.    Patient here with significant coronary artery disease status post CABG, stents with concerning story for unstable angina.  The patient is on the cardiac monitor to evaluate for evidence of arrhythmia and/or significant heart rate changes.   DIFFERENTIAL DIAGNOSIS (includes but not limited to):   Unstable angina, CHF exacerbation, PE, pneumonia, dissection   Patient's presentation is most consistent with acute presentation with potential threat to life or bodily function.   PLAN: Will obtain CBC, BMP, troponin x 2, BNP given history of CHF with chest pain and shortness of breath today, chest x-ray.  EKG shows new left anterior fascicular block.  Has anterior lateral ischemic abnormalities but this has been seen previously.  Will give full dose aspirin.  Will keep on cardiac monitoring.  Currently he is asymptomatic.  Will start heparin.   MEDICATIONS GIVEN IN ED: Medications  heparin ADULT infusion 100 units/mL (25000 units/234m) (1,200 Units/hr Intravenous New  Bag/Given 07/15/22 [id number])  aspirin chewable tablet 324 mg (324 mg Oral Given 07/15/22 0619)  heparin bolus via infusion 4,000 Units (4,000 Units Intravenous Bolus from Bag 07/15/22  0632)  furosemide (LASIX) injection 40 mg (40 mg Intravenous Given 07/15/22 0715)     ED COURSE: Patient's hemoglobin is 9.9 which is chronic for him and likely due to chronic kidney disease.  Creatinine is 4.15 which is also stable.  First troponin negative.  BNP 684 and chest x-ray reviewed and interpreted by myself and the radiologist and shows cardiomegaly with vascular congestion.  Will give aspirin, heparin, Lasix.  Will discuss with hospitalist for admission.  Last cardiac catheterization was in 2019 and showed multivessel coronary artery disease and they recommended medical therapy at that time.   CONSULTS:  Consulted and discussed patient's case with hospitalist, Dr. Francine Graven.  I have recommended admission and consulting physician agrees and will place admission orders.  Patient (and family if present) agree with this plan.   I reviewed all nursing notes, vitals, pertinent previous records.  All labs, EKGs, imaging ordered have been independently reviewed and interpreted by myself.    OUTSIDE RECORDS REVIEWED: Reviewed last internal medicine note on 01/22/2022.       FINAL CLINICAL IMPRESSION(S) / ED DIAGNOSES   Final diagnoses:  Unstable angina ([place])     Rx / DC Orders   ED Discharge Orders     None        Note:  This document was prepared using Dragon voice recognition software and may include unintentional dictation errors.   Malyn Aytes, Delice Bison, DO 07/15/22 [id number]

## 2022-07-15 NOTE — Assessment & Plan Note (Signed)
Status post cardioversion on 05/25/22 Continue amiodarone and metoprolol for rate control On Eliquis as primary prophylaxis for an acute stroke but currently on hold since patient is on a heparin drip

## 2022-07-15 NOTE — ED Triage Notes (Addendum)
Patient ambulatory to triage with steady gait, without difficulty or distress noted; pt reports last few wks having mid CP, nonradiating with [facility]; pt denies pain at present

## 2022-07-15 NOTE — Progress Notes (Addendum)
ANTICOAGULATION CONSULT NOTE  Pharmacy Consult for Heparin  Indication: chest pain/ACS  Allergies  Allergen Reactions   Predicort [Prednisolone] Other (See Comments)    Stomach pain   Ciprofloxacin Other (See Comments)    GI upset   Hydrochlorothiazide Other (See Comments)    Dehydration   Hydrocodone Nausea Only and Other (See Comments)    Stomach upset    Hydrocodone-Acetaminophen Nausea Only    Stomach upset   Sulfa Antibiotics Other (See Comments)    Cannot recall   Hydrocodone-Acetaminophen Nausea Only and Other (See Comments)    Stomach upset   Penicillins Hives, Rash and Other (See Comments)    Has patient had a PCN reaction causing immediate rash, facial/tongue/throat swelling, SOB or lightheadedness with hypotension: YES  Has patient had a PCN reaction causing severe rash involving mucus membranes or skin necrosis: NO  Has patient had a PCN reaction that required hospitalization NO  Has patient had a PCN reaction occurring within the last 10 years:NO  If all of the above answers are "NO", then may proceed with Cephalosporin use.  Has patient had a PCN reaction causing immediate rash, facial/tongue/throat swelling, SOB or lightheadedness with hypotension: YES Has patient had a PCN reaction causing severe rash involving mucus membranes or skin necrosis: NO Has patient had a PCN reaction that required hospitalization NO Has patient had a PCN reaction occurring within the last 10 years:NO If all of the above answers are "NO", then may proceed with Cephalosporin use.    Patient Measurements: Height: 5' 9"$  (175.3 cm) Weight: 102.1 kg (225 lb) IBW/kg (Calculated) : 70.7 Heparin Dosing Weight: 92.5 kg   Vital Signs: Temp: 98 F (36.7 C) (02/15 0924) Temp Source: Oral (02/15 0924) BP: 133/71 (02/15 0923) Pulse Rate: 91 (02/15 0923)  Labs: Recent Labs    07/15/22 0516 07/15/22 0624  HGB 9.9*  --   HCT 32.1*  --   PLT 136*  --   APTT  --  37*  LABPROT  --   18.9*  INR  --  1.6*  HEPARINUNFRC  --  >1.10*  CREATININE 4.15*  --   TROPONINIHS 13  --      Estimated Creatinine Clearance: 17.3 mL/min (A) (by C-G formula based on SCr of 4.15 mg/dL (H)).   Medical History: Past Medical History:  Diagnosis Date   AAA (abdominal aortic aneurysm) ([place])    a. 3cm by [place] 2015.   Arthritis    "hips; back" (12/13/2014)   CAD (coronary artery disease) 2007   a. s/p CABG- IMA-LAD, VG-Cx, VG-RCA, VG-diag in 1999. B. sp redo CABG- VG-OM, VG-RCA in 2007 due to VG disease. c. NSTEMI 11/2014 s/p DES to SVG-OM from the Y graft.d. PTCA/DES x 1 distal body of SVG to Diagonal.09/2015   Chronic combined systolic and diastolic CHF (congestive heart failure) ([place])    a. remote EF 40-45% in 2006. b. Normal EF 2014. b. Echo 07/2016 EF 45-50%, grade 1 DD. c. Echo 2020 30% to 35%. Diffuse EF 30-35%, diffuse hypokinesis.   Chronic lower back pain    CKD (chronic kidney disease), stage IV (HCC)    COPD (chronic obstructive pulmonary disease) (HCC)    Deafness in left ear    Degenerative disc disease, lumbar    Diabetes mellitus, type 2 ([place]) 10/04/2014   Microalbumin 05/11/2012-100. Foot exam/monofilament 05/11/2012-normal.   Dilated cardiomyopathy ([place]) 10/07/2015   Emphysema    Esophageal stricture 07/02/1998   EGD   Genital candidiasis in male 10/25/2012  GERD (gastroesophageal reflux disease)    History of gout    "last flareup was in 2007" (12/13/2014)   History of hiatal hernia    Hyperlipidemia    Hypertension    Ischemic cardiomyopathy 2006   Echo 2020: EF 30-35%, diffuse hypokinesis   NSTEMI (non-ST elevated myocardial infarction) ([place]) 12/13/2014   PVC's (premature ventricular contractions)    Renal artery stenosis ([place])    a. noted on CT 2008.   Type II diabetes mellitus (HCC)    Diet control    Unstable angina ([place]) 07/27/2017   Walking pneumonia 1990's   Wears dentures    full upper    Assessment: Pharmacy consulted to initiate and monitor  heparin in this 77 year old male w/ PMH of 77 yo male with history of CAD, ischemic cardiomyopathy, CKD, DM 2, HTN, HLD, AAA, renal artery stenosis, COPD, GERD, atrial fibrillation and PVCs admitted with ACS/NSTEMI.  Pt was on Eliquis 5 mg PO BID , unsure of last dose.  Goal of Therapy:  aPTT 66 - 102s  Heparin level 0.3-0.7 units/ml  Monitor platelets by anticoagulation protocol: Yes   Plan: aPTT therapeutic - continue heparin infusion at 1200 units/hr - Will use aPTT to guide dosing until correlating with HL  - Will draw aPTT in 8 hrs  - Will check HL on 2/16 with AM Labs  Dallie Piles 07/15/2022,10:10 AM

## 2022-07-15 NOTE — Discharge Instructions (Signed)

## 2022-07-16 ENCOUNTER — Observation Stay: Payer: Medicare Other

## 2022-07-16 ENCOUNTER — Encounter: Payer: Self-pay | Admitting: Internal Medicine

## 2022-07-16 DIAGNOSIS — I5022 Chronic systolic (congestive) heart failure: Secondary | ICD-10-CM | POA: Diagnosis present

## 2022-07-16 DIAGNOSIS — R1030 Lower abdominal pain, unspecified: Secondary | ICD-10-CM | POA: Diagnosis not present

## 2022-07-16 DIAGNOSIS — R079 Chest pain, unspecified: Secondary | ICD-10-CM | POA: Diagnosis present

## 2022-07-16 DIAGNOSIS — I130 Hypertensive heart and chronic kidney disease with heart failure and stage 1 through stage 4 chronic kidney disease, or unspecified chronic kidney disease: Secondary | ICD-10-CM | POA: Diagnosis present

## 2022-07-16 DIAGNOSIS — N185 Chronic kidney disease, stage 5: Secondary | ICD-10-CM | POA: Diagnosis not present

## 2022-07-16 DIAGNOSIS — R072 Precordial pain: Secondary | ICD-10-CM | POA: Diagnosis not present

## 2022-07-16 DIAGNOSIS — D631 Anemia in chronic kidney disease: Secondary | ICD-10-CM | POA: Diagnosis not present

## 2022-07-16 DIAGNOSIS — Z7901 Long term (current) use of anticoagulants: Secondary | ICD-10-CM | POA: Diagnosis not present

## 2022-07-16 DIAGNOSIS — I714 Abdominal aortic aneurysm, without rupture, unspecified: Secondary | ICD-10-CM | POA: Diagnosis present

## 2022-07-16 DIAGNOSIS — I200 Unstable angina: Secondary | ICD-10-CM | POA: Diagnosis not present

## 2022-07-16 DIAGNOSIS — I445 Left posterior fascicular block: Secondary | ICD-10-CM | POA: Diagnosis present

## 2022-07-16 DIAGNOSIS — H9192 Unspecified hearing loss, left ear: Secondary | ICD-10-CM | POA: Diagnosis present

## 2022-07-16 DIAGNOSIS — E039 Hypothyroidism, unspecified: Secondary | ICD-10-CM | POA: Diagnosis present

## 2022-07-16 DIAGNOSIS — N2581 Secondary hyperparathyroidism of renal origin: Secondary | ICD-10-CM | POA: Diagnosis not present

## 2022-07-16 DIAGNOSIS — N184 Chronic kidney disease, stage 4 (severe): Secondary | ICD-10-CM | POA: Diagnosis not present

## 2022-07-16 DIAGNOSIS — I483 Typical atrial flutter: Secondary | ICD-10-CM

## 2022-07-16 DIAGNOSIS — I080 Rheumatic disorders of both mitral and aortic valves: Secondary | ICD-10-CM | POA: Diagnosis present

## 2022-07-16 DIAGNOSIS — E1122 Type 2 diabetes mellitus with diabetic chronic kidney disease: Secondary | ICD-10-CM | POA: Diagnosis not present

## 2022-07-16 DIAGNOSIS — K219 Gastro-esophageal reflux disease without esophagitis: Secondary | ICD-10-CM | POA: Diagnosis present

## 2022-07-16 DIAGNOSIS — I444 Left anterior fascicular block: Secondary | ICD-10-CM | POA: Diagnosis present

## 2022-07-16 DIAGNOSIS — I480 Paroxysmal atrial fibrillation: Secondary | ICD-10-CM | POA: Diagnosis not present

## 2022-07-16 DIAGNOSIS — R109 Unspecified abdominal pain: Secondary | ICD-10-CM | POA: Diagnosis not present

## 2022-07-16 DIAGNOSIS — I120 Hypertensive chronic kidney disease with stage 5 chronic kidney disease or end stage renal disease: Secondary | ICD-10-CM | POA: Diagnosis not present

## 2022-07-16 DIAGNOSIS — I10 Essential (primary) hypertension: Secondary | ICD-10-CM | POA: Diagnosis not present

## 2022-07-16 DIAGNOSIS — G4733 Obstructive sleep apnea (adult) (pediatric): Secondary | ICD-10-CM | POA: Diagnosis present

## 2022-07-16 DIAGNOSIS — I25700 Atherosclerosis of coronary artery bypass graft(s), unspecified, with unstable angina pectoris: Secondary | ICD-10-CM | POA: Diagnosis present

## 2022-07-16 DIAGNOSIS — I420 Dilated cardiomyopathy: Secondary | ICD-10-CM | POA: Diagnosis present

## 2022-07-16 DIAGNOSIS — I701 Atherosclerosis of renal artery: Secondary | ICD-10-CM | POA: Diagnosis present

## 2022-07-16 DIAGNOSIS — J439 Emphysema, unspecified: Secondary | ICD-10-CM | POA: Diagnosis present

## 2022-07-16 DIAGNOSIS — E785 Hyperlipidemia, unspecified: Secondary | ICD-10-CM | POA: Diagnosis present

## 2022-07-16 DIAGNOSIS — Z9049 Acquired absence of other specified parts of digestive tract: Secondary | ICD-10-CM | POA: Diagnosis not present

## 2022-07-16 DIAGNOSIS — I255 Ischemic cardiomyopathy: Secondary | ICD-10-CM | POA: Diagnosis present

## 2022-07-16 LAB — AMYLASE: Amylase: 49 U/L (ref 28–100)

## 2022-07-16 LAB — LIPID PANEL
Cholesterol: 109 mg/dL (ref 0–200)
HDL: 37 mg/dL — ABNORMAL LOW (ref >40)
LDL Cholesterol: 59 mg/dL (ref 0–99)
Total CHOL/HDL Ratio: 2.9 RATIO
Triglycerides: 63 mg/dL (ref <150)
VLDL: 13 mg/dL (ref 0–40)

## 2022-07-16 LAB — BASIC METABOLIC PANEL
Anion gap: 12 (ref 5–15)
BUN: 66 mg/dL — ABNORMAL HIGH (ref 8–23)
CO2: 24 mmol/L (ref 22–32)
Calcium: 8.8 mg/dL — ABNORMAL LOW (ref 8.9–10.3)
Chloride: 101 mmol/L (ref 98–111)
Creatinine, Ser: 4.21 mg/dL — ABNORMAL HIGH (ref 0.61–1.24)
GFR, Estimated: 14 mL/min — ABNORMAL LOW (ref >60)
Glucose, Bld: 102 mg/dL — ABNORMAL HIGH (ref 70–99)
Potassium: 3.5 mmol/L (ref 3.5–5.1)
Sodium: 137 mmol/L (ref 135–145)

## 2022-07-16 LAB — CBC
HCT: 30.4 % — ABNORMAL LOW (ref 39.0–52.0)
Hemoglobin: 9.5 g/dL — ABNORMAL LOW (ref 13.0–17.0)
MCH: 27.4 pg (ref 26.0–34.0)
MCHC: 31.3 g/dL (ref 30.0–36.0)
MCV: 87.6 fL (ref 80.0–100.0)
Platelets: 127 10*3/uL — ABNORMAL LOW (ref 150–400)
RBC: 3.47 MIL/uL — ABNORMAL LOW (ref 4.22–5.81)
RDW: 17.1 % — ABNORMAL HIGH (ref 11.5–15.5)
WBC: 5.5 10*3/uL (ref 4.0–10.5)
nRBC: 0 % (ref 0.0–0.2)

## 2022-07-16 LAB — CBG MONITORING, ED
Glucose-Capillary: 106 mg/dL — ABNORMAL HIGH (ref 70–99)
Glucose-Capillary: 109 mg/dL — ABNORMAL HIGH (ref 70–99)
Glucose-Capillary: 95 mg/dL (ref 70–99)

## 2022-07-16 LAB — APTT: aPTT: 85 seconds — ABNORMAL HIGH (ref 24–36)

## 2022-07-16 LAB — LIPASE, BLOOD: Lipase: 37 U/L (ref 11–51)

## 2022-07-16 LAB — GLUCOSE, CAPILLARY: Glucose-Capillary: 114 mg/dL — ABNORMAL HIGH (ref 70–99)

## 2022-07-16 LAB — HEPARIN LEVEL (UNFRACTIONATED): Heparin Unfractionated: >1.1 IU/mL — ABNORMAL HIGH (ref 0.30–0.70)

## 2022-07-16 MED ORDER — METOPROLOL TARTRATE 50 MG PO TABS
150.0000 mg | ORAL_TABLET | Freq: Two times a day (BID) | ORAL | Status: DC
  Administered 2022-07-16 – 2022-07-17 (×3): 150 mg via ORAL
  Filled 2022-07-16: qty 3
  Filled 2022-07-16: qty 6
  Filled 2022-07-16: qty 3

## 2022-07-16 NOTE — Progress Notes (Signed)
PROGRESS NOTE    [name]  [id number] DOB: [birth year] DOA: 07/15/2022 PCP: Glean Hess, MD  [id number]  LOS: 0 days   Brief hospital course:   Assessment & Plan: Ronald [name] is a 77 y.o. 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    Unstable angina (HCC) Severe underlying coronary disease, prior history of CABG, prior catheterization 2019  --trop neg x2 --to rule out abdominal etiology:  pt on PPI BID PTA, had normal EGD in 2018.  Had cholecystectomy.  [place] upper abdomen today no acute finding. --cardio consulted --focus on control of afib ---If he continues to have chest pain or shortness of breath episodes, may need cardiac catheterization prior to discharge early next week  --Continue aspirin, statins --cont heparin gtt  Atrial fibs/flutter Cardioversion May 25, 2022, shortly after back to atrial fibrillation/flutter -Seen by atrial fibrillation clinic January 2024 was in atrial fibrillation at that time.  Was scheduled to see Dr. Lovena Le, [place]. -Presenting in atrial flutter, over the past 24 hours back-and-forth between atrial fibrillation and atrial flutter -Some of his symptoms of shortness of breath could be secondary to underlying arrhythmia with rates in the mid to high 90s --cont Lopressor at increased 150 mg BID --cont amiodarone --cont heparin gtt (hold home Eliquis)  Essential hypertension Blood pressure is  stable Continue metoprolol, lasix, Imdur  Hypothyroidism Stable Continue Synthroid  Chronic systolic CHF (congestive heart failure) ([place]) Not acutely exacerbated Last known LVEF of 35 to 40% from a 2D echocardiogram which was done 12/23 with grade 2 diastolic dysfunction --cont oral lasix at increased 80 mg daily --cont metop --Not a candidate for goal-directed medical therapy given renal dysfunction, ACE, ARB, ARNI, Jardiance/Farxiga on hold   CKD stage 4 due to type 2 diabetes mellitus ([place]) Renal function appears to be at his baseline  --nephro consulted on admission  Anemia of chronic kidney disease --Hgb stable in 9's   DVT prophylaxis: [id number] gtt Code Status: Full code  Family Communication: wife updated at bedside today Level of care: Telemetry Cardiac Dispo:   The patient is from: home Anticipated d/c is to: home Anticipated d/c date is: possible Monday   Subjective and Interval History:  Pt reported no chest pain this morning.  No N/V/D.  Reported chronic dyspnea on exertion.     Objective: Vitals:   07/16/22 1230 07/16/22 1330 07/16/22 1430 07/16/22 1525  BP: 129/79 129/88 130/88 123/85  Pulse: (!) 102 98 97 90  Resp: 17 17 18 16  $ Temp:   98.2 F (36.8 C)   TempSrc:      SpO2: 95% 98% 98% 97%  Weight:      Height:        Intake/Output Summary (Last 24 hours) at 07/16/2022 1613 Last data filed at 07/16/2022 1600 Gross per 24 hour  Intake 431.12 ml  Output 475 ml  Net -43.88 ml   Filed Weights   07/15/22 0511  Weight: 102.1 kg    Examination:   Constitutional: NAD, AAOx3 HEENT: conjunctivae and  lids normal, EOMI CV: No cyanosis.   RESP: normal respiratory effort, on RA Extremities: No effusions, edema in BLE SKIN: warm, dry Neuro: II - XII grossly intact.   Psych: Normal mood and affect.  Appropriate judgement and reason   Data Reviewed: I have personally reviewed labs and imaging studies  Time spent: 50 minutes  Enzo Bi,  MD [facility] If 7PM-7AM, please contact night-coverage 07/16/2022, 4:13 PM

## 2022-07-16 NOTE — Progress Notes (Signed)
ANTICOAGULATION CONSULT NOTE  Pharmacy Consult for Heparin  Indication: chest pain/ACS  Allergies  Allergen Reactions   Predicort [Prednisolone] Other (See Comments)    Stomach pain   Ciprofloxacin Other (See Comments)    GI upset   Hydrochlorothiazide Other (See Comments)    Dehydration   Hydrocodone Nausea Only and Other (See Comments)    Stomach upset    Hydrocodone-Acetaminophen Nausea Only    Stomach upset   Sulfa Antibiotics Other (See Comments)    Cannot recall   Hydrocodone-Acetaminophen Nausea Only and Other (See Comments)    Stomach upset   Penicillins Hives, Rash and Other (See Comments)    Has patient had a PCN reaction causing immediate rash, facial/tongue/throat swelling, SOB or lightheadedness with hypotension: YES  Has patient had a PCN reaction causing severe rash involving mucus membranes or skin necrosis: NO  Has patient had a PCN reaction that required hospitalization NO  Has patient had a PCN reaction occurring within the last 10 years:NO  If all of the above answers are "NO", then may proceed with Cephalosporin use.  Has patient had a PCN reaction causing immediate rash, facial/tongue/throat swelling, SOB or lightheadedness with hypotension: YES Has patient had a PCN reaction causing severe rash involving mucus membranes or skin necrosis: NO Has patient had a PCN reaction that required hospitalization NO Has patient had a PCN reaction occurring within the last 10 years:NO If all of the above answers are "NO", then may proceed with Cephalosporin use.    Patient Measurements: Height: 5' 9"$  ([id number] cm) Weight: 102.1 kg (225 lb) IBW/kg (Calculated) : 70.7 Heparin Dosing Weight: 92.5 kg   Vital Signs: Temp: 97.9 F (36.6 C) (02/16 0028) Temp Source: Oral (02/16 0028) BP: 120/64 (02/16 0000) Pulse Rate: 88 (02/16 0000)  Labs: Recent Labs    07/15/22 0516 07/15/22 0624 07/15/22 1200 07/15/22 1513 07/16/22 0050  HGB 9.9*  --   --   --   --   HCT  32.1*  --   --   --   --   PLT 136*  --   --   --   --   APTT  --  37*  --  90* 85*  LABPROT  --  18.9*  --   --   --   INR  --  1.6*  --   --   --   HEPARINUNFRC  --  >1.10*  --   --  >1.10*  CREATININE 4.15*  --   --   --   --   TROPONINIHS 13  --  12  --   --      Estimated Creatinine Clearance: 17.3 mL/min (A) (by C-G formula based on SCr of 4.15 mg/dL (H)).   Medical History: Past Medical History:  Diagnosis Date   AAA (abdominal aortic aneurysm) ([place])    a. 3cm by [place] 2015.   Arthritis    "hips; back" (12/13/2014)   CAD (coronary artery disease) 2007   a. s/p CABG- IMA-LAD, VG-Cx, VG-RCA, VG-diag in 1999. B. sp redo CABG- VG-OM, VG-RCA in 2007 due to VG disease. c. NSTEMI 11/2014 s/p DES to SVG-OM from the Y graft.d. PTCA/DES x 1 distal body of SVG to Diagonal.09/2015   Chronic combined systolic and diastolic CHF (congestive heart failure) ([place])    a. remote EF 40-45% in 2006. b. Normal EF 2014. b. Echo 07/2016 EF 45-50%, grade 1 DD. c. Echo 2020 30% to 35%. Diffuse EF 30-35%, diffuse hypokinesis.  Chronic lower back pain    CKD (chronic kidney disease), stage IV (HCC)    COPD (chronic obstructive pulmonary disease) (HCC)    Deafness in left ear    Degenerative disc disease, lumbar    Diabetes mellitus, type 2 ([place]) 10/04/2014   Microalbumin 05/11/2012-100. Foot exam/monofilament 05/11/2012-normal.   Dilated cardiomyopathy ([place]) 10/07/2015   Emphysema    Esophageal stricture 07/02/1998   EGD   Genital candidiasis in male 10/25/2012   GERD (gastroesophageal reflux disease)    History of gout    "last flareup was in 2007" (12/13/2014)   History of hiatal hernia    Hyperlipidemia    Hypertension    Ischemic cardiomyopathy 2006   Echo 2020: EF 30-35%, diffuse hypokinesis   NSTEMI (non-ST elevated myocardial infarction) ([place]) 12/13/2014   PVC's (premature ventricular contractions)    Renal artery stenosis ([place])    a. noted on CT 2008.   Type II diabetes mellitus (HCC)     Diet control    Unstable angina ([place]) 07/27/2017   Walking pneumonia 1990's   Wears dentures    full upper    Assessment: Pharmacy consulted to initiate and monitor heparin in this 77 year old male w/ PMH of 77 yo male with history of CAD, ischemic cardiomyopathy, CKD, DM 2, HTN, HLD, AAA, renal artery stenosis, COPD, GERD, atrial fibrillation and PVCs admitted with ACS/NSTEMI.  Pt was on Eliquis 5 mg PO BID , unsure of last dose.  Goal of Therapy:  aPTT 66 - 102s  Heparin level 0.3-0.7 units/ml  Monitor platelets by anticoagulation protocol: Yes   Plan: 2/16 @ 0050:  aPTT = 85,  HL = >1.10 - aPTT therapeutic X 2, HL still elevated from Eliquis PTA - Will continue pt on current rate and recheck aPTT and HL on     2/17 with AM labs.  - Will use aPTT to guide dosing until correlating with HL   Yanky Vanderburg D 07/16/2022,1:42 AM

## 2022-07-16 NOTE — Progress Notes (Signed)
Progress Note  Patient Name: Ronald [name] [facility] Date of Encounter: 07/16/2022  Primary Cardiologist: Angelena Form  Subjective   He was not given the one time dose of Imdur 30 mg yesterday (for a total of titrated dose of 60 mg), for unclear reasons. BP stable in the [id number] to [id number] systolic. He reports an episode of abdominal pain last night and this morning that did not feel like his prior angina. No chest pain or dyspnea. No palpitations. Remains in Afib/flutter with rates in the 70s-90s bpm.   Inpatient Medications    Scheduled Meds:  acetaminophen  1,000 mg Oral QHS   allopurinol  100 mg Oral QHS   amiodarone  200 mg Oral Daily   aspirin EC  81 mg Oral Daily   atorvastatin  40 mg Oral QHS   cholecalciferol  2,000 Units Oral Daily   furosemide  80 mg Oral Daily   isosorbide mononitrate  60 mg Oral Daily   levothyroxine  75 mcg Oral Q0600   metoprolol tartrate  100 mg Oral BID   multivitamin with minerals  1 tablet Oral Daily   pantoprazole  40 mg Oral BID   sodium bicarbonate  650 mg Oral BID   Continuous Infusions:  heparin 1,200 Units/hr (07/16/22 0044)   PRN Meds: nitroGLYCERIN, ondansetron (ZOFRAN) IV, polyethylene glycol   Vital Signs    Vitals:   07/16/22 0028 07/16/22 0300 07/16/22 0430 07/16/22 0449  BP:  126/71 (!) 120/99   Pulse:  85 84   Resp:  18 (!) 23   Temp: 97.9 F (36.6 C)   97.8 F (36.6 C)  TempSrc: Oral   Oral  SpO2:  93% 97%   Weight:      Height:       No intake or output data in the 24 hours ending 07/16/22 0832 Filed Weights   07/15/22 0511  Weight: 102.1 kg    Telemetry    Afib/flutter with ventricular rates in the 70s to 90s bpm - Personally Reviewed  ECG    No new tracings - Personally Reviewed  Physical Exam   GEN: No acute distress.   Neck: No JVD. Cardiac: IRIR, no murmurs, rubs, or gallops.  Respiratory: Clear to auscultation bilaterally.  GI: Soft, nontender, non-distended.   MS: No edema; No deformity. Neuro:  Alert  and oriented x 3; Nonfocal.  Psych: Normal affect.  Labs    Chemistry Recent Labs  Lab 07/15/22 0516 07/16/22 0456  NA 141 137  K 4.1 3.5  CL 107 101  CO2 24 24  GLUCOSE 112* 102*  BUN 62* 66*  CREATININE 4.15* 4.21*  CALCIUM 9.2 8.8*  GFRNONAA 14* 14*  ANIONGAP 10 12     Hematology Recent Labs  Lab 07/15/22 0516 07/16/22 0456  WBC 5.4 5.5  RBC 3.65* 3.47*  HGB 9.9* 9.5*  HCT 32.1* 30.4*  MCV 87.9 87.6  MCH 27.1 27.4  MCHC 30.8 31.3  RDW 17.1* 17.1*  PLT 136* 127*    Cardiac EnzymesNo results for input(s): "TROPONINI" in the last 168 hours. No results for input(s): "TROPIPOC" in the last 168 hours.   BNP Recent Labs  Lab 07/15/22 0516  BNP 684.9*     DDimer No results for input(s): "DDIMER" in the last 168 hours.   Radiology    DG Chest 1 View  Result Date: 07/15/2022 IMPRESSION: Chronic cardiomegaly with vascular congestion. Electronically Signed   By: Jorje Guild M.D.   On: 07/15/2022 05:36  Cardiac Studies   2D echo 05/29/2022: 1. Left ventricular ejection fraction, by estimation, is 35 to 40%. The  left ventricle has moderately decreased function. The left ventricle  demonstrates global hypokinesis. The left ventricular internal cavity size  was mildly dilated. Left ventricular  diastolic parameters are consistent with Grade II diastolic dysfunction  (pseudonormalization).   2. Right ventricular systolic function is mildly reduced. The right  ventricular size is normal. There is normal pulmonary artery systolic  pressure. The estimated right ventricular systolic pressure is 123456 mmHg.   3. The mitral valve is normal in structure. Mild mitral valve  regurgitation. No evidence of mitral stenosis.   4. Tricuspid valve regurgitation is mild to moderate.   5. The aortic valve has an indeterminant number of cusps. There is  moderate calcification of the aortic valve. Aortic valve regurgitation is  not visualized. Aortic valve  sclerosis/calcification is present, without  any evidence of aortic stenosis.   6. The inferior vena cava is normal in size with greater than 50%  respiratory variability, suggesting right atrial pressure of 3 mmHg.  __________   LHC 07/28/2017: Ost RCA to Prox RCA lesion is 95% stenosed. Prox RCA lesion is 100% stenosed. Prox Cx lesion is 100% stenosed. Prox LAD lesion is 100% stenosed. Origin lesion is 100% stenosed. Origin lesion is 100% stenosed. Previously placed Dist Graft to Insertion stent (unknown type) is widely patent. Origin to Prox Graft lesion is 10% stenosed. Prox Graft lesion is 30% stenosed. Mid Graft to Dist Graft lesion is 30% stenosed. Mid Graft lesion is 50% stenosed.   Significant native CAD with total occlusion of the LAD after the takeoff of the first diagonal vessel; total occlusion of the proximal left circumflex coronary artery; and total occlusion of the proximal RCA with antegrade bridging collaterals.   Patent LIMA to LAD.   Patent Y vein graft from the 1996 surgery which supplies the diagonal vessel and distal circumflex marginal vessel.  There is diffuse narrowing of 50% in the midportion of the Y graft supplying the diagonal vessel and a patent distal stent extending to the ostium of the diagonal.  The Y graft supplying the distal marginal is free of significant disease and has mild luminal irregularity.   Occluded vein graft which had supplied the distal RCA from the 1996 surgery.   Patent SVG supplying the distal RCA from the 2007 surgery with a stent in the proximal portion of the graft with 30% narrowings in the proximal and mid segment.   Occluded vein graft from 2007 which appears to also have been stented.   LVEDP 18 mm   RECOMMENDATION: Increase medical therapy.  Patient Profile     77 y.o. male with history of CAD s/p CABG in 1996 (LIMA-LAD, SVG-circumflex, SVG-RCA, SVG-diagonal) with redo bypass in 2007 (SVG-OM, SVG-RCA) s/p subsequent  multiple PCI, ischemic cardiomyopathy, PAF on Eliquis, CKD stage V, DM2, HTN, HLD, AAA, renal artery stenosis, COPD, and GERD who is being seen today for the evaluation of angina at the request of Dr. Francine Graven.   Assessment & Plan    1. CAD status post CABG status post redo CABG status post subsequent PCI with unstable angina: -Currently without symptoms of angina or cardiac decompensation -High sensitivity troponin negative x 2 -Heparin drip for now given he is on apixaban in the outpatient setting, until it is clear he will not require inpatient invasive testing -Recent echo showed stable cardiomyopathy -Overall, this is not a straightforward decision given his degree  of underlying CAD and significant CKD -We discussed evaluation and management options including medical therapy only, pharmacologic MPI, and diagnostic cardiac cath -He declines stress testing -For unclear reasons, he did not receive the additional 30 mg of Imdur yesterday -He is scheduled for titrated dose of Imdur 60 mg daily today -Ideally, would like to defer cardiac cath given normal troponins and in the setting of CKD stage V, would pursue as a last resort for refractory angina despite optimization of antianginal therapy  -Lopressor and atorvastatin   2.  HFrEF secondary to ICM: -Euvolemic and well compensated -Lopressor and Lasix -Not on ACE inhibitor/ARB/ARNI/SGLT2 inhibitor/MRA secondary to advanced CKD   3.  PAF: -He remains in Afib/flutter with controlled ventricular response -Failed recent DCCV on amiodarone -Not a digoxin candidate given CKD -Diltiazem not an option with cardiomyopathy -Titrate Lopressor to 150 mg bid, perhaps his arrhythmia is contributing to his symptoms -Remains on PTA amiodarone with plans for him to follow up with EP in the outpatient setting for further discussion of possible rhythm control -If rate control strategy is pursued, would stop amiodarone at that time -Apixaban on hold until  it is clear he will not require inpatient invasive testing -Heparin drip for now -CHA2DS2-VASc at least 6 (CHF, HTN, age x 2, DM, vascular disease) -At time of discharge, resume PTA apixaban 5 mg twice daily, he does not meet reduced dosing criteria   4.  CKD stage V: -Management per IM and nephrology   5.  Anemia of chronic disease: -Hemoglobin stable   6.  AAA: -Stable on most recent ultrasound -Outpatient follow-up   7.  HTN: -BP stable   8.  HLD: -PTA atorvastatin   9.  OSA: -CPAP   10. GERD: -Possibly contributing to presentation   11. Abdominal pain: -Reports 2 episodes of significant abdominal pain that did not feel like his angina and were rated an 8/10  -Pain lasted for ~ 1 minute -Consider imaging, will defer to IM       For questions or updates, please contact [place] Please consult [email or web address] for contact info under Cardiology/STEMI.    Signed, Christell Faith, PA-C [facility] Pager: [phone] 07/16/2022, 8:32 AM

## 2022-07-16 NOTE — Progress Notes (Signed)
[facility]  ROUNDING NOTE   Subjective:   [name] [facility] was admitted to [facility] on 07/15/2022 for Unstable angina [facility]) [I20.0]  Patient was last seen by Dr. Candiss Norse, nephrology on 06/21/22. Patient has an upcoming evaluation for PD catheter placement.   Patient with his wife at bedside who assists with history taking. Patient is deaf in one ear and must be spoken to loudly.   Patient with shortness of breath and chest pain on admission. Placed on heparin gtt. Cardiology has adjusted his metoprolol and isosorbide mononitrate.  Patient had labs down on 2/13 at our office showing stable kidney failure with creatinine of 3.85, GFR of 15.    Objective:  Vital signs in last 24 hours:  Temp:  [97.7 F (36.5 C)-98 F (36.7 C)] 98 F (36.7 C) (02/16 1100) Pulse Rate:  [84-101] 94 (02/16 1100) Resp:  [12-26] 16 (02/16 1100) BP: (108-141)/(61-99) 130/74 (02/16 1100) SpO2:  [92 %-98 %] 97 % (02/16 1100)  Weight change:  Filed Weights   07/15/22 0511  Weight: 102.1 kg    Intake/Output: No intake/output data recorded.   Intake/Output this shift:  No intake/output data recorded.  Physical Exam: General: NAD, sitting up in bed  Head: Normocephalic, atraumatic. Moist oral mucosal membranes  Eyes: Anicteric, PERRL  Neck: Supple, trachea midline  Lungs:  Clear to auscultation  Heart: irregular  Abdomen:  Soft, nontender, obese  Extremities:  trace peripheral edema.  Neurologic: Nonfocal, moving all four extremities  Skin: No lesions  Access: none    Basic Metabolic Panel: Recent Labs  Lab 07/15/22 0516 07/16/22 0456  NA 141 137  K 4.1 3.5  CL 107 101  CO2 24 24  GLUCOSE 112* 102*  BUN 62* 66*  CREATININE 4.15* 4.21*  CALCIUM 9.2 8.8*    Liver Function Tests: No results for input(s): "AST", "ALT", "ALKPHOS", "BILITOT", "PROT", "ALBUMIN" in the last 168 hours. Recent Labs  Lab 07/16/22 0455  LIPASE 37  AMYLASE 49   No results for input(s):  "AMMONIA" in the last 168 hours.  CBC: Recent Labs  Lab 07/15/22 0516 07/16/22 0456  WBC 5.4 5.5  HGB 9.9* 9.5*  HCT 32.1* 30.4*  MCV 87.9 87.6  PLT 136* 127*    Cardiac Enzymes: No results for input(s): "CKTOTAL", "CKMB", "CKMBINDEX", "TROPONINI" in the last 168 hours.  BNP: Invalid input(s): "POCBNP"  CBG: Recent Labs  Lab 07/15/22 1719 07/16/22 0052 07/16/22 0736 07/16/22 1124  GLUCAP 122* 106* 95 109*    Microbiology: Results for orders placed or performed during the hospital encounter of 05/28/22  Resp panel by RT-PCR (RSV, Flu A&B, Covid) Anterior Nasal Swab     Status: None   Collection Time: 05/28/22  8:19 PM   Specimen: Anterior Nasal Swab  Result Value Ref Range Status   SARS Coronavirus 2 by RT PCR NEGATIVE NEGATIVE Final    Comment: (NOTE) SARS-CoV-2 target nucleic acids are NOT DETECTED.  The SARS-CoV-2 RNA is generally detectable in upper respiratory specimens during the acute phase of infection. The lowest concentration of SARS-CoV-2 viral copies this assay can detect is 138 copies/mL. A negative result does not preclude SARS-Cov-2 infection and should not be used as the sole basis for treatment or other patient management decisions. A negative result may occur with  improper specimen collection/handling, submission of specimen other than nasopharyngeal swab, presence of viral mutation(s) within the areas targeted by this assay, and inadequate number of viral copies(<138 copies/mL). A negative result must be  combined with clinical observations, patient history, and epidemiological information. The expected result is Negative.  Fact Sheet for Patients:  EntrepreneurPulse.com.au  Fact Sheet for Healthcare Providers:  IncredibleEmployment.be  This test is no t yet approved or cleared by the [place] FDA and  has been authorized for detection and/or diagnosis of SARS-CoV-2 by FDA under an Emergency Use  Authorization (EUA). This EUA will remain  in effect (meaning this test can be used) for the duration of the COVID-19 declaration under Section 564(b)(1) of the Act, 21 U.S.C.section 360bbb-3(b)(1), unless the authorization is terminated  or revoked sooner.       Influenza A by PCR NEGATIVE NEGATIVE Final   Influenza B by PCR NEGATIVE NEGATIVE Final    Comment: (NOTE) The Xpert Xpress SARS-CoV-2/FLU/RSV plus assay is intended as an aid in the diagnosis of influenza from Nasopharyngeal swab specimens and should not be used as a sole basis for treatment. Nasal washings and aspirates are unacceptable for Xpert Xpress SARS-CoV-2/FLU/RSV testing.  Fact Sheet for Patients: EntrepreneurPulse.com.au  Fact Sheet for Healthcare Providers: IncredibleEmployment.be  This test is not yet approved or cleared by the [place] FDA and has been authorized for detection and/or diagnosis of SARS-CoV-2 by FDA under an Emergency Use Authorization (EUA). This EUA will remain in effect (meaning this test can be used) for the duration of the COVID-19 declaration under Section 564(b)(1) of the Act, 21 U.S.C. section 360bbb-3(b)(1), unless the authorization is terminated or revoked.     Resp Syncytial Virus by PCR NEGATIVE NEGATIVE Final    Comment: (NOTE) Fact Sheet for Patients: EntrepreneurPulse.com.au  Fact Sheet for Healthcare Providers: IncredibleEmployment.be  This test is not yet approved or cleared by the [place] FDA and has been authorized for detection and/or diagnosis of SARS-CoV-2 by FDA under an Emergency Use Authorization (EUA). This EUA will remain in effect (meaning this test can be used) for the duration of the COVID-19 declaration under Section 564(b)(1) of the Act, 21 U.S.C. section 360bbb-3(b)(1), unless the authorization is terminated or revoked.  Performed at [facility], [address] [place], [place] [address]     Coagulation Studies: Recent Labs    07/15/22 0624  LABPROT 18.9*  INR 1.6*    Urinalysis: No results for input(s): "COLORURINE", "LABSPEC", "PHURINE", "GLUCOSEU", "HGBUR", "BILIRUBINUR", "KETONESUR", "PROTEINUR", "UROBILINOGEN", "NITRITE", "LEUKOCYTESUR" in the last 72 hours.  Invalid input(s): "APPERANCEUR"    Imaging: US Abdomen Limited RUQ (LIVER/GB)  Result Date: 07/16/2022 CLINICAL DATA:  Abdominal pain. EXAM: ULTRASOUND ABDOMEN LIMITED RIGHT UPPER QUADRANT COMPARISON:  February 12, 2021. FINDINGS: Gallbladder: Status post cholecystectomy. Common bile duct: Diameter: 2 mm which is within normal limits. Liver: No focal lesion identified. Within normal limits in parenchymal echogenicity. Portal vein is patent on color Doppler imaging with normal direction of blood flow towards the liver. Other: None. IMPRESSION: Status post cholecystectomy. No other significant abnormality seen in the right upper quadrant of the abdomen. Electronically Signed   By: Marijo Conception M.D.   On: 07/16/2022 10:25   DG Chest 1 View  Result Date: 07/15/2022 CLINICAL DATA:  Chest pain EXAM: CHEST  1 VIEW COMPARISON:  05/28/2022 FINDINGS: Cardiomegaly. Mediastinal contours are distorted by rotation. Diffuse interstitial coarsening with congested appearance of central vessels. No focal airspace disease. Prior CABG. No effusion or pneumothorax is seen. IMPRESSION: Chronic cardiomegaly with vascular congestion. Electronically Signed   By: Jorje Guild M.D.   On: 07/15/2022 05:36     Medications:    heparin 1,200  Units/hr (07/16/22 1122)    acetaminophen  1,000 mg Oral QHS   allopurinol  100 mg Oral QHS   amiodarone  200 mg Oral Daily   aspirin EC  81 mg Oral Daily   atorvastatin  40 mg Oral QHS   cholecalciferol  2,000 Units Oral Daily   furosemide  80 mg Oral Daily   isosorbide mononitrate  60 mg Oral Daily   levothyroxine  75 mcg Oral Q0600   metoprolol  tartrate  150 mg Oral BID   multivitamin with minerals  1 tablet Oral Daily   pantoprazole  40 mg Oral BID   sodium bicarbonate  650 mg Oral BID   nitroGLYCERIN, ondansetron (ZOFRAN) IV, polyethylene glycol  Assessment/ Plan:  [name] is a 77 y.o. white male with coronary artery disease status post CABG, AAA, congestive heart failure, COPD, hypertension, renal artery stenosis, diabetes mellitus type II, atrial fibrillation, obstructive sleep apnea, and hypothyroidism who presents to [facility] on 07/15/2022 for Unstable angina ([place]) [I20.0]  Chronic kidney disease stage V: no acute indication for dialysis. Outpatient planning for peritoneal dialysis. Patient educated that IV contrast exposure with cardiac catheterization may cause worsening renal function. Patient understands.   Hypertension: with chronic kidney disease: with acute coronary syndrome. Cardiology has adjusted patient's metoprolol dose and isosorbide mononitrate dose to maximize medical management. Continue furosemide, edema is at baseline.   Anemia of chronic kidney disease: normocytic with hemoglobin of 9.5. Avoid ESA with current ischemia.  Secondary Hyperparathyroidism: PTH of 146. Phosphorus and calcium at goal. Not currently on vitamin D agent or phos binders.    LOS: 0 Sariyah Corcino 2/16/202411:46 AM

## 2022-07-16 NOTE — Consult Note (Signed)
   Heart Failure Nurse Navigator Note  HFrEF 35 to 40%.  Left internal ventricular cavity is mildly enlarged.  Grade 2 diastolic dysfunction.  Right ventricular systolic function is mildly reduced.  Mild to moderate tricuspid regurgitation.  He presented to the emergency room with complaints of chest tightness and shortness of breath.  BNP 684.  Chest x-ray revealed vascular congestion and cardiomegaly.  Comorbidities:  Coronary artery disease status post coronary artery bypass grafting COPD Chronic kidney disease Hypertension Hyperlipidemia Diabetes Aortic abdominal aneurysm Atrial flutter Hypothyroidism  Medications:  Amiodarone 200 mg daily Aspirin 81 mg daily Atorvastatin 40 mg daily Furosemide 80 mg daily Isosorbide mononitrate 60 mg daily Levothyroxine 75 mg daily Metoprolol tartrate 150 mg 2 times a day  Labs:  Sodium 137, potassium 3.5, chloride 101, CO2 24, BUN 66, creatinine 4.21, estimated GFR 14 No intake or output documented. Weight documented at 102 kg   Initial meeting with the patient and his wife who was at the bedside.  States at home that they have been very compliant with his daily weights, his weight range from 224-226.  States at home that he still continues to not have much of an appetite since he feels that the food has no flavor.  He felt that this became worse after he got his COVID shot.  Scheduled next week to see a doctor in [place] about getting his peritoneal dialysis catheter placed.  Moved his appointment from the 21st to the 26th with the outpatient heart failure clinic and he will be seen at 3:30 in the afternoon.  They had no further questions.  Pricilla Riffle RN CHFN

## 2022-07-17 ENCOUNTER — Inpatient Hospital Stay: Payer: Medicare Other

## 2022-07-17 DIAGNOSIS — I209 Angina pectoris, unspecified: Secondary | ICD-10-CM

## 2022-07-17 DIAGNOSIS — R072 Precordial pain: Secondary | ICD-10-CM

## 2022-07-17 LAB — CBC
HCT: 30.1 % — ABNORMAL LOW (ref 39.0–52.0)
Hemoglobin: 9.6 g/dL — ABNORMAL LOW (ref 13.0–17.0)
MCH: 27.5 pg (ref 26.0–34.0)
MCHC: 31.9 g/dL (ref 30.0–36.0)
MCV: 86.2 fL (ref 80.0–100.0)
Platelets: 121 10*3/uL — ABNORMAL LOW (ref 150–400)
RBC: 3.49 MIL/uL — ABNORMAL LOW (ref 4.22–5.81)
RDW: 17.2 % — ABNORMAL HIGH (ref 11.5–15.5)
WBC: 5.5 10*3/uL (ref 4.0–10.5)
nRBC: 0 % (ref 0.0–0.2)

## 2022-07-17 LAB — BASIC METABOLIC PANEL
Anion gap: 10 (ref 5–15)
BUN: 67 mg/dL — ABNORMAL HIGH (ref 8–23)
CO2: 23 mmol/L (ref 22–32)
Calcium: 9.4 mg/dL (ref 8.9–10.3)
Chloride: 107 mmol/L (ref 98–111)
Creatinine, Ser: 4.21 mg/dL — ABNORMAL HIGH (ref 0.61–1.24)
GFR, Estimated: 14 mL/min — ABNORMAL LOW (ref >60)
Glucose, Bld: 100 mg/dL — ABNORMAL HIGH (ref 70–99)
Potassium: 3.8 mmol/L (ref 3.5–5.1)
Sodium: 140 mmol/L (ref 135–145)

## 2022-07-17 LAB — HEPARIN LEVEL (UNFRACTIONATED): Heparin Unfractionated: >1.1 IU/mL — ABNORMAL HIGH (ref 0.30–0.70)

## 2022-07-17 LAB — APTT: aPTT: 96 seconds — ABNORMAL HIGH (ref 24–36)

## 2022-07-17 LAB — MAGNESIUM: Magnesium: 2.4 mg/dL (ref 1.7–2.4)

## 2022-07-17 LAB — GLUCOSE, CAPILLARY: Glucose-Capillary: 103 mg/dL — ABNORMAL HIGH (ref 70–99)

## 2022-07-17 MED ORDER — METOPROLOL TARTRATE 100 MG PO TABS: 150.0000 mg | ORAL_TABLET | Freq: Two times a day (BID) | ORAL | 2 refills | Status: DC

## 2022-07-17 MED ORDER — APIXABAN 5 MG PO TABS
5.0000 mg | ORAL_TABLET | Freq: Two times a day (BID) | ORAL | Status: DC
  Administered 2022-07-17: 5 mg via ORAL
  Filled 2022-07-17: qty 1

## 2022-07-17 MED ORDER — PANTOPRAZOLE SODIUM 40 MG PO TBEC: 40.0000 mg | DELAYED_RELEASE_TABLET | Freq: Two times a day (BID) | ORAL | Status: DC

## 2022-07-17 MED ORDER — ATORVASTATIN CALCIUM 40 MG PO TABS: 40.0000 mg | ORAL_TABLET | Freq: Every day | ORAL | Status: DC

## 2022-07-17 MED ORDER — FUROSEMIDE 40 MG PO TABS: 60.0000 mg | ORAL_TABLET | Freq: Every day | ORAL | 2 refills | Status: DC

## 2022-07-17 MED ORDER — ISOSORBIDE MONONITRATE ER 60 MG PO TB24: 60.0000 mg | ORAL_TABLET | Freq: Every day | ORAL | 2 refills | Status: DC

## 2022-07-17 NOTE — Progress Notes (Signed)
ANTICOAGULATION CONSULT NOTE  Pharmacy Consult for Heparin  Indication: chest pain/ACS  Allergies  Allergen Reactions   Predicort [Prednisolone] Other (See Comments)    Stomach pain   Ciprofloxacin Other (See Comments)    GI upset   Hydrochlorothiazide Other (See Comments)    Dehydration   Hydrocodone Nausea Only and Other (See Comments)    Stomach upset    Hydrocodone-Acetaminophen Nausea Only    Stomach upset   Sulfa Antibiotics Other (See Comments)    Cannot recall   Hydrocodone-Acetaminophen Nausea Only and Other (See Comments)    Stomach upset   Penicillins Hives, Rash and Other (See Comments)    Has patient had a PCN reaction causing immediate rash, facial/tongue/throat swelling, SOB or lightheadedness with hypotension: YES  Has patient had a PCN reaction causing severe rash involving mucus membranes or skin necrosis: NO  Has patient had a PCN reaction that required hospitalization NO  Has patient had a PCN reaction occurring within the last 10 years:NO  If all of the above answers are "NO", then may proceed with Cephalosporin use.  Has patient had a PCN reaction causing immediate rash, facial/tongue/throat swelling, SOB or lightheadedness with hypotension: YES Has patient had a PCN reaction causing severe rash involving mucus membranes or skin necrosis: NO Has patient had a PCN reaction that required hospitalization NO Has patient had a PCN reaction occurring within the last 10 years:NO If all of the above answers are "NO", then may proceed with Cephalosporin use.    Patient Measurements: Height: 5' 9"$  (175.3 cm) Weight: 102.1 kg (225 lb) IBW/kg (Calculated) : 70.7 Heparin Dosing Weight: 92.5 kg   Vital Signs: Temp: 97.5 F (36.4 C) (02/17 0503) Temp Source: Oral (02/17 0503) BP: 125/69 (02/17 0503) Pulse Rate: 97 (02/17 0503)  Labs: Recent Labs    07/15/22 0516 07/15/22 [id number] 07/15/22 0624 07/15/22 1200 07/15/22 1513 07/16/22 0050 07/16/22 0456  07/17/22 0458  HGB 9.9*  --   --   --   --   --  9.5* 9.6*  HCT 32.1*  --   --   --   --   --  30.4* 30.1*  PLT 136*  --   --   --   --   --  127* 121*  APTT  --  37*   < >  --  90* 85*  --  96*  LABPROT  --  18.9*  --   --   --   --   --   --   INR  --  1.6*  --   --   --   --   --   --   HEPARINUNFRC  --  >1.10*  --   --   --  >1.10*  --  >1.10*  CREATININE 4.15*  --   --   --   --   --  4.21* 4.21*  TROPONINIHS 13  --   --  12  --   --   --   --    < > = values in this interval not displayed.     Estimated Creatinine Clearance: 17 mL/min (A) (by C-G formula based on SCr of 4.21 mg/dL (H)).   Medical History: Past Medical History:  Diagnosis Date   AAA (abdominal aortic aneurysm) ([place])    a. 3cm by [place] 2015.   Arthritis    "hips; back" (12/13/2014)   CAD (coronary artery disease) 2007   a. s/p CABG- IMA-LAD, VG-Cx, VG-RCA, VG-diag in  1999. B. sp redo CABG- VG-OM, VG-RCA in 2007 due to VG disease. c. NSTEMI 11/2014 s/p DES to SVG-OM from the Y graft.d. PTCA/DES x 1 distal body of SVG to Diagonal.09/2015   Chronic combined systolic and diastolic CHF (congestive heart failure) ([place])    a. remote EF 40-45% in 2006. b. Normal EF 2014. b. Echo 07/2016 EF 45-50%, grade 1 DD. c. Echo 2020 30% to 35%. Diffuse EF 30-35%, diffuse hypokinesis.   Chronic lower back pain    CKD (chronic kidney disease), stage IV (HCC)    COPD (chronic obstructive pulmonary disease) (HCC)    Deafness in left ear    Degenerative disc disease, lumbar    Diabetes mellitus, type 2 ([place]) 10/04/2014   Microalbumin 05/11/2012-100. Foot exam/monofilament 05/11/2012-normal.   Dilated cardiomyopathy ([place]) 10/07/2015   Emphysema    Esophageal stricture 07/02/1998   EGD   Genital candidiasis in male 10/25/2012   GERD (gastroesophageal reflux disease)    History of gout    "last flareup was in 2007" (12/13/2014)   History of hiatal hernia    Hyperlipidemia    Hypertension    Ischemic cardiomyopathy 2006   Echo 2020:  EF 30-35%, diffuse hypokinesis   NSTEMI (non-ST elevated myocardial infarction) ([place]) 12/13/2014   PVC's (premature ventricular contractions)    Renal artery stenosis ([place])    a. noted on CT 2008.   Type II diabetes mellitus (HCC)    Diet control    Unstable angina ([place]) 07/27/2017   Walking pneumonia 1990's   Wears dentures    full upper    Assessment: Pharmacy consulted to initiate and monitor heparin in this 77 year old male w/ PMH of 77 yo male with history of CAD, ischemic cardiomyopathy, CKD, DM 2, HTN, HLD, AAA, renal artery stenosis, COPD, GERD, atrial fibrillation and PVCs admitted with ACS/NSTEMI.  Pt was on Eliquis 5 mg PO BID , unsure of last dose.  Goal of Therapy:  aPTT 66 - 102s  Heparin level 0.3-0.7 units/ml  Monitor platelets by anticoagulation protocol: Yes   Plan: 2/17 @ 0050:  aPTT = 96,  HL = >1.10 - aPTT therapeutic X 3, HL still elevated from Eliquis PTA - Will continue pt on current rate and recheck aPTT and HL on     2/18 with AM labs.  - Will use aPTT to guide dosing until correlating with HL   Loree Shehata D 07/17/2022,7:02 AM

## 2022-07-17 NOTE — Progress Notes (Signed)
Progress Note  Patient Name: Ronald [name] [facility] Date of Encounter: 07/17/2022  Primary Cardiologist: Angelena Form  Subjective   No further chest pain or abdominal pain. He feels like the titrated dose of Imdur has helped. He would like to continue to pursue medical management at this time without invasive cardiac testing.    Inpatient Medications    Scheduled Meds:  acetaminophen  1,000 mg Oral QHS   allopurinol  100 mg Oral QHS   amiodarone  200 mg Oral Daily   aspirin EC  81 mg Oral Daily   atorvastatin  40 mg Oral QHS   cholecalciferol  2,000 Units Oral Daily   furosemide  80 mg Oral Daily   isosorbide mononitrate  60 mg Oral Daily   levothyroxine  75 mcg Oral Q0600   metoprolol tartrate  150 mg Oral BID   multivitamin with minerals  1 tablet Oral Daily   pantoprazole  40 mg Oral BID   sodium bicarbonate  650 mg Oral BID   Continuous Infusions:  heparin 1,200 Units/hr (07/17/22 0016)   PRN Meds: nitroGLYCERIN, ondansetron (ZOFRAN) IV, polyethylene glycol   Vital Signs    Vitals:   07/16/22 2013 07/16/22 2100 07/17/22 0001 07/17/22 0503  BP: 116/77  127/87 125/69  Pulse: 94 98 88 97  Resp: 18  19 18  $ Temp: 97.9 F (36.6 C)  98.2 F (36.8 C) (!) 97.5 F (36.4 C)  TempSrc: Oral  Oral Oral  SpO2: 95%  97% 94%  Weight:      Height:        Intake/Output Summary (Last 24 hours) at 07/17/2022 0748 Last data filed at 07/17/2022 0600 Gross per 24 hour  Intake 719.65 ml  Output 1350 ml  Net -630.35 ml   Filed Weights   07/15/22 0511  Weight: 102.1 kg    Telemetry    Afib/flutter with ventricular rates in the 70s to 90s bpm - Personally Reviewed  ECG    No new tracings - Personally Reviewed  Physical Exam   GEN: No acute distress.   Neck: No JVD. Cardiac: IRIR, no murmurs, rubs, or gallops.  Respiratory: Clear to auscultation bilaterally.  GI: Soft, nontender, non-distended.   MS: No edema; No deformity. Neuro:  Alert and oriented x 3; Nonfocal.  Psych:  Normal affect.  Labs    Chemistry Recent Labs  Lab 07/15/22 0516 07/16/22 0456 07/17/22 0458  NA 141 137 140  K 4.1 3.5 3.8  CL 107 101 107  CO2 24 24 23  $ GLUCOSE 112* 102* 100*  BUN 62* 66* 67*  CREATININE 4.15* 4.21* 4.21*  CALCIUM 9.2 8.8* 9.4  GFRNONAA 14* 14* 14*  ANIONGAP 10 12 10      $ Hematology Recent Labs  Lab 07/15/22 0516 07/16/22 0456 07/17/22 0458  WBC 5.4 5.5 5.5  RBC 3.65* 3.47* 3.49*  HGB 9.9* 9.5* 9.6*  HCT 32.1* 30.4* 30.1*  MCV 87.9 87.6 86.2  MCH 27.1 27.4 27.5  MCHC 30.8 31.3 31.9  RDW 17.1* 17.1* 17.2*  PLT 136* 127* 121*     Cardiac EnzymesNo results for input(s): "TROPONINI" in the last 168 hours. No results for input(s): "TROPIPOC" in the last 168 hours.   BNP Recent Labs  Lab 07/15/22 0516  BNP 684.9*      DDimer No results for input(s): "DDIMER" in the last 168 hours.   Radiology    DG Chest 1 View  Result Date: 07/15/2022 IMPRESSION: Chronic cardiomegaly with vascular congestion. Electronically Signed  By: Jorje Guild M.D.   On: 07/15/2022 05:36    Cardiac Studies   2D echo 05/29/2022: 1. Left ventricular ejection fraction, by estimation, is 35 to 40%. The  left ventricle has moderately decreased function. The left ventricle  demonstrates global hypokinesis. The left ventricular internal cavity size  was mildly dilated. Left ventricular  diastolic parameters are consistent with Grade II diastolic dysfunction  (pseudonormalization).   2. Right ventricular systolic function is mildly reduced. The right  ventricular size is normal. There is normal pulmonary artery systolic  pressure. The estimated right ventricular systolic pressure is 123456 mmHg.   3. The mitral valve is normal in structure. Mild mitral valve  regurgitation. No evidence of mitral stenosis.   4. Tricuspid valve regurgitation is mild to moderate.   5. The aortic valve has an indeterminant number of cusps. There is  moderate calcification of the  aortic valve. Aortic valve regurgitation is  not visualized. Aortic valve sclerosis/calcification is present, without  any evidence of aortic stenosis.   6. The inferior vena cava is normal in size with greater than 50%  respiratory variability, suggesting right atrial pressure of 3 mmHg.  __________   LHC 07/28/2017: Ost RCA to Prox RCA lesion is 95% stenosed. Prox RCA lesion is 100% stenosed. Prox Cx lesion is 100% stenosed. Prox LAD lesion is 100% stenosed. Origin lesion is 100% stenosed. Origin lesion is 100% stenosed. Previously placed Dist Graft to Insertion stent (unknown type) is widely patent. Origin to Prox Graft lesion is 10% stenosed. Prox Graft lesion is 30% stenosed. Mid Graft to Dist Graft lesion is 30% stenosed. Mid Graft lesion is 50% stenosed.   Significant native CAD with total occlusion of the LAD after the takeoff of the first diagonal vessel; total occlusion of the proximal left circumflex coronary artery; and total occlusion of the proximal RCA with antegrade bridging collaterals.   Patent LIMA to LAD.   Patent Y vein graft from the 1996 surgery which supplies the diagonal vessel and distal circumflex marginal vessel.  There is diffuse narrowing of 50% in the midportion of the Y graft supplying the diagonal vessel and a patent distal stent extending to the ostium of the diagonal.  The Y graft supplying the distal marginal is free of significant disease and has mild luminal irregularity.   Occluded vein graft which had supplied the distal RCA from the 1996 surgery.   Patent SVG supplying the distal RCA from the 2007 surgery with a stent in the proximal portion of the graft with 30% narrowings in the proximal and mid segment.   Occluded vein graft from 2007 which appears to also have been stented.   LVEDP 18 mm   RECOMMENDATION: Increase medical therapy.  Patient Profile     77 y.o. male with history of CAD s/p CABG in 1996 (LIMA-LAD, SVG-circumflex,  SVG-RCA, SVG-diagonal) with redo bypass in 2007 (SVG-OM, SVG-RCA) s/p subsequent multiple PCI, ischemic cardiomyopathy, PAF on Eliquis, CKD stage V, DM2, HTN, HLD, AAA, renal artery stenosis, COPD, and GERD who is being seen today for the evaluation of angina at the request of Dr. Francine Graven.   Assessment & Plan    1. CAD status post CABG status post redo CABG status post subsequent PCI with unstable angina: -Currently without symptoms of angina or cardiac decompensation -High sensitivity troponin negative x 2 -Improved on titrated dose of Imdur 60 mg -He does not want to pursue invasive cardiac testing at this time, which is reasonable given resolution of  symptoms, negative HS-Tn x 2, and with underlying CKD stage V -Would defer ischemic testing to his primary cardiologist in follow up -Heparin drip for now  -Lopressor and atorvastatin   2.  HFrEF secondary to ICM: -Euvolemic and well compensated -Lopressor and Lasix -Not on ACE inhibitor/ARB/ARNI/SGLT2 inhibitor/MRA secondary to advanced CKD   3.  PAF: -He remains in Afib/flutter with controlled ventricular response -Failed recent DCCV on amiodarone -Not a digoxin candidate given CKD -Diltiazem not an option with cardiomyopathy -Titrate Lopressor to 150 mg bid, perhaps his arrhythmia is contributing to his symptoms -Remains on PTA amiodarone with plans for him to follow up with EP in the outpatient setting for further discussion of possible rhythm control -If rate control strategy is pursued, would stop amiodarone at that time -Apixaban on hold until it is clear he will not require inpatient invasive testing -Heparin drip for now -CHA2DS2-VASc at least 6 (CHF, HTN, age x 2, DM, vascular disease) -At time of discharge, resume PTA apixaban 5 mg twice daily, he does not meet reduced dosing criteria   4.  CKD stage V: -Stable -Avoid nephrotoxic agents -Management per IM and nephrology   5.  Anemia of chronic disease: -Hemoglobin  stable   6.  AAA: -Stable on most recent ultrasound -Outpatient follow-up   7.  HTN: -BP stable   8.  HLD: -PTA atorvastatin   9.  OSA: -CPAP   10. GERD: -Possibly contributing to presentation       For questions or updates, please contact [facility] Please consult [email or web address] for contact info under Cardiology/STEMI.    Signed, Christell Faith, PA-C [facility] Pager: [phone] 07/17/2022, 7:48 AM

## 2022-07-17 NOTE — Plan of Care (Signed)

## 2022-07-17 NOTE — Progress Notes (Signed)
[facility]  ROUNDING NOTE   Subjective:   Ronald [name] was admitted to [facility] on 07/15/2022 for Unstable angina [facility]) [I20.0] Chest pain [R07.9]  Patient was last seen by Dr. Candiss Norse, nephrology on 06/21/22. Patient has an upcoming evaluation for PD catheter placement.   Renal function about the same. Sitting up and eating breakfast this a.m.   Objective:  Vital signs in last 24 hours:  Temp:  [97.5 F (36.4 C)-98.2 F (36.8 C)] 97.5 F (36.4 C) (02/17 0852) Pulse Rate:  [88-102] 92 (02/17 0852) Resp:  [16-19] 18 (02/17 0852) BP: (116-134)/(69-88) 134/72 (02/17 0852) SpO2:  [93 %-98 %] 93 % (02/17 0852)  Weight change:  Filed Weights   07/15/22 0511  Weight: 102.1 kg    Intake/Output: I/O last 3 completed shifts: In: 719.7 [P.O.:120; I.V.:599.7] Out: 1350 [Urine:1350]   Intake/Output this shift:  Total I/O In: -  Out: 450 [Urine:450]  Physical Exam: General: NAD, sitting up in bed  Head: Normocephalic, atraumatic. Moist oral mucosal membranes  Eyes: Anicteric  Neck: Supple, trachea midline  Lungs:  Clear to auscultation  Heart: irregular  Abdomen:  Soft, nontender, obese  Extremities: Trace peripheral edema.  Neurologic: Nonfocal, moving all four extremities  Skin: No lesions  Access: none    Basic Metabolic Panel: Recent Labs  Lab 07/15/22 0516 07/16/22 0456 07/17/22 0458  NA 141 137 140  K 4.1 3.5 3.8  CL 107 101 107  CO2 24 24 23  $ GLUCOSE 112* 102* 100*  BUN 62* 66* 67*  CREATININE 4.15* 4.21* 4.21*  CALCIUM 9.2 8.8* 9.4  MG  --   --  2.4     Liver Function Tests: No results for input(s): "AST", "ALT", "ALKPHOS", "BILITOT", "PROT", "ALBUMIN" in the last 168 hours. Recent Labs  Lab 07/16/22 0455  LIPASE 37  AMYLASE 49    No results for input(s): "AMMONIA" in the last 168 hours.  CBC: Recent Labs  Lab 07/15/22 0516 07/16/22 0456 07/17/22 0458  WBC 5.4 5.5 5.5  HGB 9.9* 9.5* 9.6*  HCT 32.1* 30.4* 30.1*  MCV  87.9 87.6 86.2  PLT 136* 127* 121*     Cardiac Enzymes: No results for input(s): "CKTOTAL", "CKMB", "CKMBINDEX", "TROPONINI" in the last 168 hours.  BNP: Invalid input(s): "POCBNP"  CBG: Recent Labs  Lab 07/16/22 0052 07/16/22 0736 07/16/22 1124 07/16/22 1611 07/17/22 0855  GLUCAP 106* 95 109* 114* 103*     Microbiology: Results for orders placed or performed during the hospital encounter of 05/28/22  Resp panel by RT-PCR (RSV, Flu A&B, Covid) Anterior Nasal Swab     Status: None   Collection Time: 05/28/22  8:19 PM   Specimen: Anterior Nasal Swab  Result Value Ref Range Status   SARS Coronavirus 2 by RT PCR NEGATIVE NEGATIVE Final    Comment: (NOTE) SARS-CoV-2 target nucleic acids are NOT DETECTED.  The SARS-CoV-2 RNA is generally detectable in upper respiratory specimens during the acute phase of infection. The lowest concentration of SARS-CoV-2 viral copies this assay can detect is 138 copies/mL. A negative result does not preclude SARS-Cov-2 infection and should not be used as the sole basis for treatment or other patient management decisions. A negative result may occur with  improper specimen collection/handling, submission of specimen other than nasopharyngeal swab, presence of viral mutation(s) within the areas targeted by this assay, and inadequate number of viral copies(<138 copies/mL). A negative result must be combined with clinical observations, patient history, and epidemiological information. The expected  result is Negative.  Fact Sheet for Patients:  EntrepreneurPulse.com.au  Fact Sheet for Healthcare Providers:  IncredibleEmployment.be  This test is no t yet approved or cleared by the [place] FDA and  has been authorized for detection and/or diagnosis of SARS-CoV-2 by FDA under an Emergency Use Authorization (EUA). This EUA will remain  in effect (meaning this test can be used) for the duration of  the COVID-19 declaration under Section 564(b)(1) of the Act, 21 U.S.C.section 360bbb-3(b)(1), unless the authorization is terminated  or revoked sooner.       Influenza A by PCR NEGATIVE NEGATIVE Final   Influenza B by PCR NEGATIVE NEGATIVE Final    Comment: (NOTE) The Xpert Xpress SARS-CoV-2/FLU/RSV plus assay is intended as an aid in the diagnosis of influenza from Nasopharyngeal swab specimens and should not be used as a sole basis for treatment. Nasal washings and aspirates are unacceptable for Xpert Xpress SARS-CoV-2/FLU/RSV testing.  Fact Sheet for Patients: EntrepreneurPulse.com.au  Fact Sheet for Healthcare Providers: IncredibleEmployment.be  This test is not yet approved or cleared by the [place] FDA and has been authorized for detection and/or diagnosis of SARS-CoV-2 by FDA under an Emergency Use Authorization (EUA). This EUA will remain in effect (meaning this test can be used) for the duration of the COVID-19 declaration under Section 564(b)(1) of the Act, 21 U.S.C. section 360bbb-3(b)(1), unless the authorization is terminated or revoked.     Resp Syncytial Virus by PCR NEGATIVE NEGATIVE Final    Comment: (NOTE) Fact Sheet for Patients: EntrepreneurPulse.com.au  Fact Sheet for Healthcare Providers: IncredibleEmployment.be  This test is not yet approved or cleared by the [place] FDA and has been authorized for detection and/or diagnosis of SARS-CoV-2 by FDA under an Emergency Use Authorization (EUA). This EUA will remain in effect (meaning this test can be used) for the duration of the COVID-19 declaration under Section 564(b)(1) of the Act, 21 U.S.C. section 360bbb-3(b)(1), unless the authorization is terminated or revoked.  Performed at [facility], [address] [place], [place] [address]     Coagulation Studies: Recent Labs    07/15/22 0624  LABPROT  18.9*  INR 1.6*     Urinalysis: No results for input(s): "COLORURINE", "LABSPEC", "PHURINE", "GLUCOSEU", "HGBUR", "BILIRUBINUR", "KETONESUR", "PROTEINUR", "UROBILINOGEN", "NITRITE", "LEUKOCYTESUR" in the last 72 hours.  Invalid input(s): "APPERANCEUR"    Imaging: US Abdomen Limited RUQ (LIVER/GB)  Result Date: 07/16/2022 CLINICAL DATA:  Abdominal pain. EXAM: ULTRASOUND ABDOMEN LIMITED RIGHT UPPER QUADRANT COMPARISON:  February 12, 2021. FINDINGS: Gallbladder: Status post cholecystectomy. Common bile duct: Diameter: 2 mm which is within normal limits. Liver: No focal lesion identified. Within normal limits in parenchymal echogenicity. Portal vein is patent on color Doppler imaging with normal direction of blood flow towards the liver. Other: None. IMPRESSION: Status post cholecystectomy. No other significant abnormality seen in the right upper quadrant of the abdomen. Electronically Signed   By: Marijo Conception M.D.   On: 07/16/2022 10:25     Medications:      acetaminophen  1,000 mg Oral QHS   allopurinol  100 mg Oral QHS   amiodarone  200 mg Oral Daily   apixaban  5 mg Oral BID   aspirin EC  81 mg Oral Daily   atorvastatin  40 mg Oral QHS   cholecalciferol  2,000 Units Oral Daily   furosemide  80 mg Oral Daily   isosorbide mononitrate  60 mg Oral Daily   levothyroxine  75 mcg Oral Q0600  metoprolol tartrate  150 mg Oral BID   multivitamin with minerals  1 tablet Oral Daily   pantoprazole  40 mg Oral BID   sodium bicarbonate  650 mg Oral BID   nitroGLYCERIN, ondansetron (ZOFRAN) IV, polyethylene glycol  Assessment/ Plan:  Ronald [name] is a 77 y.o. white male with coronary artery disease status post CABG, AAA, congestive heart failure, COPD, hypertension, renal artery stenosis, diabetes mellitus type II, atrial fibrillation, obstructive sleep apnea, and hypothyroidism who presents to [facility] on 07/15/2022 for Unstable angina ([place]) [I20.0] Chest pain [R07.9]  Chronic  kidney disease stage V: no acute indication for dialysis. Outpatient planning for peritoneal dialysis. Patient educated that IV contrast exposure with cardiac catheterization may cause worsening renal function.   Hypertension: with chronic kidney disease: with acute coronary syndrome.  Continue furosemide and metoprolol.  Anemia of chronic kidney disease: normocytic with hemoglobin of 9.5. Avoid ESA with current ischemia.  Secondary Hyperparathyroidism: PTH of 146. Phosphorus and calcium at goal.  Monitor further as outpatient.   LOS: 1 Zoi Devine 2/17/202411:17 AM

## 2022-07-17 NOTE — Discharge Summary (Signed)
Physician Discharge Summary   Ronald [name] [facility]  male DOB: 1946-03-14  [id number]  PCP: Glean Hess, MD  Admit date: 07/15/2022 Discharge date: 07/17/2022  Admitted From: home Disposition:  home Wife updated at bedside prior to discharge. CODE STATUS: Full code  Discharge Instructions     Diet - low sodium heart healthy   Complete by: As directed    No wound care   Complete by: As directed       Hospital Course:  For full details, please see H&P, progress notes, consult notes and ancillary notes.  Briefly,  Ronald [name] is a 77 y.o. 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    Angina ([place]) Severe underlying coronary disease, prior history of CABG, prior catheterization 2019  --trop neg x2 --to rule out abdominal etiology:  pt already on PPI BID PTA, had normal EGD in 2018.  Had cholecystectomy.  [place] upper abdomen no acute finding. --cardio consulted, decision made for medical management and no cardiac cath given CKD4.  Pt received heparin gtt for 72 hours. --home Imdur increased from 30 to 60 mg daily. --cont statin --outpatient f/u with cardiology Dr. Angelena Form in 2-3 weeks.   Atrial fibs/flutter Cardioversion May 25, 2022, shortly after back to atrial fibrillation/flutter -Seen by atrial fibrillation clinic January 2024 was in atrial fibrillation at that time.  Was scheduled to see Dr. Lovena Le, [place]. -Presenting in atrial flutter,  back-and-forth between atrial fibrillation and atrial flutter -Some of his symptoms of shortness of breath could be secondary to underlying arrhythmia with rates in the mid to high 90s --cont Lopressor at increased 150 mg BID --cont amiodarone --cont home Eliquis   Essential hypertension Blood pressure is stable Continue metoprolol, lasix, Imdur   Hypothyroidism Stable Continue Synthroid   Chronic systolic CHF (congestive heart failure) ([place]) Not acutely exacerbated Last known LVEF of 35 to 40% from a 2D echocardiogram which was done 12/23 with grade 2 diastolic dysfunction --home lasix increased from 40 mg to 60 mg daily at discharge. --cont Lopressor at increased 150 mg BID --Not a candidate for goal-directed medical therapy given renal dysfunction, ACE, ARB, ARNI, Jardiance/Farxiga on hold    CKD stage 4 due to type 2 diabetes mellitus ([place]) Renal function appears to be at his baseline  --nephro consulted on admission --Patient has an upcoming evaluation for PD catheter placement.     Anemia of chronic kidney disease --Hgb stable in 9's   Discharge Diagnoses:  Principal Problem:   Chest pain Active Problems:   Unstable angina (HCC)   Essential hypertension   Paroxysmal atrial fibrillation (HCC)   Coronary artery disease   Hypothyroidism   CKD stage 4 due to type 2 diabetes mellitus (HCC)   Chronic systolic CHF (congestive heart failure) (HCC)   Typical atrial flutter ([place])   30 Day Unplanned Readmission Risk Score    Flowsheet Row ED to Hosp-Admission (Current) from 07/15/2022 in [place] [facility]  30 Day Unplanned Readmission Risk Score (%) 19.43 Filed at 07/17/2022 0800  This score is the patient's risk of an unplanned readmission within 30 days of being discharged (0 -100%). The score is based on dignosis, age, lab data, medications, orders, and past utilization.   Low:  0-14.9   Medium: 15-21.9   High: 22-29.9   Extreme: 30 and above          Discharge Instructions:  Allergies as of 07/17/2022       Reactions   Predicort [prednisolone] Other (See Comments)   Stomach pain   Ciprofloxacin Other (See Comments)   GI upset   Hydrochlorothiazide Other (See Comments)   Dehydration   Hydrocodone Nausea Only, Other (See Comments)   Stomach upset   Hydrocodone-acetaminophen Nausea Only   Stomach upset   Sulfa Antibiotics Other (See Comments)   Cannot recall   Hydrocodone-acetaminophen Nausea Only, Other (See Comments)   Stomach upset   Penicillins Hives, Rash, Other (See Comments)   Has patient had a PCN reaction causing immediate rash, facial/tongue/throat swelling, SOB or lightheadedness with hypotension: YES Has patient had a PCN reaction causing severe rash involving mucus membranes or skin necrosis: NO Has patient had a PCN reaction that required hospitalization NO Has patient had a PCN reaction occurring within the last 10 years:NO If all of the above answers are "NO", then may proceed with Cephalosporin use. Has patient had a PCN reaction causing immediate rash, facial/tongue/throat swelling, SOB or lightheadedness with hypotension: YES Has patient had a PCN reaction causing severe rash involving mucus membranes or skin necrosis: NO Has patient had a PCN reaction that required hospitalization NO Has patient had a PCN reaction occurring within the last 10 years:NO If all of the above answers are "NO", then may proceed with Cephalosporin use.        Medication List     TAKE these medications    acetaminophen 650 MG CR tablet Commonly known as: TYLENOL Take 1,300 mg by mouth at bedtime.   allopurinol 100 MG tablet Commonly known as: ZYLOPRIM TAKE 1 TABLET BY MOUTH EVERYDAY AT BEDTIME   amiodarone 200 MG tablet Commonly known as: PACERONE Take 200 mg by mouth daily.   apixaban 5 MG Tabs tablet Commonly known as: ELIQUIS Take 1 tablet (5 mg total) by mouth 2 (two) times daily.   atorvastatin 40 MG  tablet Commonly known as: LIPITOR Take 1 tablet (40 mg total) by mouth at bedtime. Home med. What changed:  when to take this additional instructions   furosemide 40 MG tablet Commonly known as: LASIX Take 1.5 tablets (60 mg total) by mouth daily. Increased from 40 mg. What changed:  how much to take additional instructions   isosorbide mononitrate 60 MG 24 hr tablet Commonly known as: IMDUR Take 1 tablet (60 mg total) by mouth daily. Increased from 30 mg. What changed:  medication strength how much to take additional instructions   levothyroxine 75 MCG tablet Commonly known as: SYNTHROID Take 1 tablet (75 mcg total) by mouth daily before breakfast.   metoprolol tartrate 100 MG tablet Commonly known as: LOPRESSOR Take 1.5 tablets (150 mg total) by mouth 2 (two) times daily. Increased from 100 mg twice daily. What changed:  how much to take additional instructions   MULTIVITAMIN PO Take 1 tablet by mouth daily.   nitroGLYCERIN 0.4 MG SL tablet Commonly known as: NITROSTAT Place 0.4 mg under the tongue every 5 (five) minutes as needed for chest pain (Up to 3 times).   pantoprazole 40 MG tablet Commonly known as: PROTONIX Take 1  tablet (40 mg total) by mouth 2 (two) times daily. Home med. What changed:  how much to take when to take this additional instructions   polyethylene glycol 17 g packet Commonly known as: MIRALAX / GLYCOLAX Take 17 g by mouth at bedtime.   sodium bicarbonate 650 MG tablet Take 650 mg by mouth 2 (two) times daily.   Vitamin D-3 125 MCG (5000 UT) Tabs Take 2,000 Units by mouth daily.         Follow-up Information     Burnell Blanks, MD Follow up in 2 week(s).   Specialty: Cardiology Contact information: [place] [place] [address] [place] [address] [phone]         Glean Hess, MD Follow up in 1 week(s).   Specialty: Internal Medicine Contact information: [address] [place]  [address] [phone]                 Allergies  Allergen Reactions   Predicort [Prednisolone] Other (See Comments)    Stomach pain   Ciprofloxacin Other (See Comments)    GI upset   Hydrochlorothiazide Other (See Comments)    Dehydration   Hydrocodone Nausea Only and Other (See Comments)    Stomach upset    Hydrocodone-Acetaminophen Nausea Only    Stomach upset   Sulfa Antibiotics Other (See Comments)    Cannot recall   Hydrocodone-Acetaminophen Nausea Only and Other (See Comments)    Stomach upset   Penicillins Hives, Rash and Other (See Comments)    Has patient had a PCN reaction causing immediate rash, facial/tongue/throat swelling, SOB or lightheadedness with hypotension: YES  Has patient had a PCN reaction causing severe rash involving mucus membranes or skin necrosis: NO  Has patient had a PCN reaction that required hospitalization NO  Has patient had a PCN reaction occurring within the last 10 years:NO  If all of the above answers are "NO", then may proceed with Cephalosporin use.  Has patient had a PCN reaction causing immediate rash, facial/tongue/throat swelling, SOB or lightheadedness with hypotension: YES Has patient had a PCN reaction causing severe rash involving mucus membranes or skin necrosis: NO Has patient had a PCN reaction that required hospitalization NO Has patient had a PCN reaction occurring within the last 10 years:NO If all of the above answers are "NO", then may proceed with Cephalosporin use.     The results of significant diagnostics from this hospitalization (including imaging, microbiology, ancillary and laboratory) are listed below for reference.   Consultations:   Procedures/Studies: US Abdomen Limited RUQ (LIVER/GB)  Result Date: 07/16/2022 CLINICAL DATA:  Abdominal pain. EXAM: ULTRASOUND ABDOMEN LIMITED RIGHT UPPER QUADRANT COMPARISON:  February 12, 2021. FINDINGS: Gallbladder: Status post cholecystectomy. Common bile duct:  Diameter: 2 mm which is within normal limits. Liver: No focal lesion identified. Within normal limits in parenchymal echogenicity. Portal vein is patent on color Doppler imaging with normal direction of blood flow towards the liver. Other: None. IMPRESSION: Status post cholecystectomy. No other significant abnormality seen in the right upper quadrant of the abdomen. Electronically Signed   By: Marijo Conception M.D.   On: 07/16/2022 10:25   DG Chest 1 View  Result Date: 07/15/2022 CLINICAL DATA:  Chest pain EXAM: CHEST  1 VIEW COMPARISON:  05/28/2022 FINDINGS: Cardiomegaly. Mediastinal contours are distorted by rotation. Diffuse interstitial coarsening with congested appearance of central vessels. No focal airspace disease. Prior CABG. No effusion or pneumothorax is seen. IMPRESSION: Chronic cardiomegaly with vascular congestion.  Electronically Signed   By: Jorje Guild M.D.   On: 07/15/2022 05:36      Labs: BNP (last 3 results) Recent Labs    05/28/22 1504 07/15/22 0516  BNP 788.6* [id number]*   Basic Metabolic Panel: Recent Labs  Lab 07/15/22 0516 07/16/22 0456 07/17/22 0458  NA 141 137 140  K 4.1 3.5 3.8  CL 107 101 107  CO2 24 24 23  $ GLUCOSE 112* 102* 100*  BUN 62* 66* 67*  CREATININE 4.15* 4.21* 4.21*  CALCIUM 9.2 8.8* 9.4  MG  --   --  2.4   Liver Function Tests: No results for input(s): "AST", "ALT", "ALKPHOS", "BILITOT", "PROT", "ALBUMIN" in the last 168 hours. Recent Labs  Lab 07/16/22 0455  LIPASE 37  AMYLASE 49   No results for input(s): "AMMONIA" in the last 168 hours. CBC: Recent Labs  Lab 07/15/22 0516 07/16/22 0456 07/17/22 0458  WBC 5.4 5.5 5.5  HGB 9.9* 9.5* 9.6*  HCT 32.1* 30.4* 30.1*  MCV 87.9 87.6 86.2  PLT 136* 127* 121*   Cardiac Enzymes: No results for input(s): "CKTOTAL", "CKMB", "CKMBINDEX", "TROPONINI" in the last 168 hours. BNP: Invalid input(s): "POCBNP" CBG: Recent Labs  Lab 07/16/22 0052 07/16/22 0736 07/16/22 1124 07/16/22 1611  07/17/22 0855  GLUCAP 106* 95 109* 114* 103*   D-Dimer No results for input(s): "DDIMER" in the last 72 hours. Hgb A1c No results for input(s): "HGBA1C" in the last 72 hours. Lipid Profile Recent Labs    07/16/22 0456  CHOL 109  HDL 37*  LDLCALC 59  TRIG 63  CHOLHDL 2.9   Thyroid function studies No results for input(s): "TSH", "T4TOTAL", "T3FREE", "THYROIDAB" in the last 72 hours.  Invalid input(s): "FREET3" Anemia work up No results for input(s): "VITAMINB12", "FOLATE", "FERRITIN", "TIBC", "IRON", "RETICCTPCT" in the last 72 hours. Urinalysis    Component Value Date/Time   COLORURINE YELLOW (A) 05/28/2022 1501   APPEARANCEUR CLEAR (A) 05/28/2022 1501   LABSPEC 1.013 05/28/2022 1501   PHURINE 6.0 05/28/2022 1501   GLUCOSEU NEGATIVE 05/28/2022 1501   HGBUR NEGATIVE 05/28/2022 1501   BILIRUBINUR NEGATIVE 05/28/2022 1501   KETONESUR NEGATIVE 05/28/2022 1501   PROTEINUR NEGATIVE 05/28/2022 1501   NITRITE NEGATIVE 05/28/2022 1501   LEUKOCYTESUR NEGATIVE 05/28/2022 1501   Sepsis Labs Recent Labs  Lab 07/15/22 0516 07/16/22 0456 07/17/22 0458  WBC 5.4 5.5 5.5   Microbiology No results found for this or any previous visit (from the past 240 hour(s)).   Total time spend on discharging this patient, including the last patient exam, discussing the hospital stay, instructions for ongoing care as it relates to all pertinent caregivers, as well as preparing the medical discharge records, prescriptions, and/or referrals as applicable, is 40 minutes.    Enzo Bi, MD  [facility] 07/17/2022, 11:02 AM

## 2022-07-18 LAB — LIPOPROTEIN A (LPA): Lipoprotein (a): 177.9 nmol/L — ABNORMAL HIGH (ref <75.0)

## 2022-07-19 ENCOUNTER — Telehealth: Payer: Self-pay

## 2022-07-19 NOTE — Transitions of Care (Post Inpatient/ED Visit) (Signed)
   07/19/2022  Name: Ronald [name] [facility] MRN: [id number] DOB: 1945-11-02  Today's TOC FU Call Status: Today's TOC FU Call Status:: Successful TOC FU Call Competed TOC FU Call Complete Date: 07/17/22  Transition Care Management Follow-up Telephone Call Date of Discharge: 07/26/22 Discharge Facility: [facility]) Type of Discharge: Inpatient Admission Primary Inpatient Discharge Diagnosis:: angina How have you been since you were released from the hospital?: Better Any questions or concerns?: No  Items Reviewed: Did you receive and understand the discharge instructions provided?: No Medications obtained and verified?: Yes (Medications Reviewed) Any new allergies since your discharge?: No Dietary orders reviewed?: NA Do you have support at home?: Yes People in Home: spouse  Home Care and Equipment/Supplies: [place] Ordered?: NA Any new equipment or medical supplies ordered?: NA  Functional Questionnaire: Do you need assistance with bathing/showering or dressing?: No Do you need assistance with meal preparation?: No Do you need assistance with eating?: No Do you have difficulty maintaining continence: No Do you need assistance with getting out of bed/getting out of a chair/moving?: No Do you have difficulty managing or taking your medications?: No  Folllow up appointments reviewed: PCP Follow-up appointment confirmed?: Yes Date of PCP follow-up appointment?: 07/26/22 Follow-up Provider: Dr Army Melia [facility] Follow-up appointment confirmed?: Yes Date of Specialist follow-up appointment?: 07/27/22 Follow-Up Specialty Provider:: Sherri Hammock Do you need transportation to your follow-up appointment?: No Do you understand care options if your condition(s) worsen?: Yes-patient verbalized understanding    SIGNATURE Juanda Crumble, [place] Nurse Health Advisor Direct Dial [phone]

## 2022-07-20 ENCOUNTER — Telehealth: Payer: Self-pay

## 2022-07-20 NOTE — Telephone Encounter (Signed)
ERROR

## 2022-07-21 ENCOUNTER — Encounter: Payer: Medicare Other | Admitting: Family

## 2022-07-21 DIAGNOSIS — I120 Hypertensive chronic kidney disease with stage 5 chronic kidney disease or end stage renal disease: Secondary | ICD-10-CM | POA: Diagnosis not present

## 2022-07-21 DIAGNOSIS — I10 Essential (primary) hypertension: Secondary | ICD-10-CM | POA: Diagnosis not present

## 2022-07-21 DIAGNOSIS — Z951 Presence of aortocoronary bypass graft: Secondary | ICD-10-CM | POA: Diagnosis not present

## 2022-07-21 DIAGNOSIS — I25118 Atherosclerotic heart disease of native coronary artery with other forms of angina pectoris: Secondary | ICD-10-CM | POA: Diagnosis not present

## 2022-07-21 DIAGNOSIS — E785 Hyperlipidemia, unspecified: Secondary | ICD-10-CM | POA: Diagnosis not present

## 2022-07-21 DIAGNOSIS — Z7901 Long term (current) use of anticoagulants: Secondary | ICD-10-CM | POA: Diagnosis not present

## 2022-07-21 DIAGNOSIS — G4733 Obstructive sleep apnea (adult) (pediatric): Secondary | ICD-10-CM | POA: Diagnosis not present

## 2022-07-22 DIAGNOSIS — I10 Essential (primary) hypertension: Secondary | ICD-10-CM | POA: Diagnosis not present

## 2022-07-22 DIAGNOSIS — I701 Atherosclerosis of renal artery: Secondary | ICD-10-CM | POA: Diagnosis not present

## 2022-07-22 DIAGNOSIS — I129 Hypertensive chronic kidney disease with stage 1 through stage 4 chronic kidney disease, or unspecified chronic kidney disease: Secondary | ICD-10-CM | POA: Diagnosis not present

## 2022-07-22 DIAGNOSIS — G4733 Obstructive sleep apnea (adult) (pediatric): Secondary | ICD-10-CM | POA: Insufficient documentation

## 2022-07-22 DIAGNOSIS — I25118 Atherosclerotic heart disease of native coronary artery with other forms of angina pectoris: Secondary | ICD-10-CM | POA: Insufficient documentation

## 2022-07-22 DIAGNOSIS — N2581 Secondary hyperparathyroidism of renal origin: Secondary | ICD-10-CM | POA: Diagnosis not present

## 2022-07-22 DIAGNOSIS — N185 Chronic kidney disease, stage 5: Secondary | ICD-10-CM | POA: Diagnosis not present

## 2022-07-22 DIAGNOSIS — R600 Localized edema: Secondary | ICD-10-CM | POA: Diagnosis not present

## 2022-07-22 DIAGNOSIS — I120 Hypertensive chronic kidney disease with stage 5 chronic kidney disease or end stage renal disease: Secondary | ICD-10-CM | POA: Insufficient documentation

## 2022-07-24 NOTE — Progress Notes (Unsigned)
Patient ID: [name], male    DOB: 1945/12/05, 77 y.o.   MRN: [id number]  HPI  Mr Jurgensen is a 77 y/o male with a history of AAA, CAD, DM, hyperlipidemia, HTN, CKD, COPD, GERD, gout, pneumonia, previous tobacco use and chronic heart failure.   Echo 05/29/22 showed an EF of 35-40% along with normal PA pressure, mild MR and mild/moderate TR.   LHC 07/28/17 showed: Ost RCA to Prox RCA lesion is 95% stenosed. Prox RCA lesion is 100% stenosed. Prox Cx lesion is 100% stenosed. Prox LAD lesion is 100% stenosed. Origin lesion is 100% stenosed. Origin lesion is 100% stenosed. Previously placed Dist Graft to Insertion stent (unknown type) is widely patent. Origin to Prox Graft lesion is 10% stenosed. Prox Graft lesion is 30% stenosed. Mid Graft to Dist Graft lesion is 30% stenosed. Mid Graft lesion is 50% stenosed.   Significant native CAD with total occlusion of the LAD after the takeoff of the first diagonal vessel; total occlusion of the proximal left circumflex coronary artery; and total occlusion of the proximal RCA with antegrade bridging collaterals.  Patent LIMA to LAD.  Patent Y vein graft from the 1996 surgery which supplies the diagonal vessel and distal circumflex marginal vessel.  There is diffuse narrowing of 50% in the midportion of the Y graft supplying the diagonal vessel and a patent distal stent extending to the ostium of the diagonal.  The Y graft supplying the distal marginal is free of significant disease and has mild luminal irregularity. Occluded vein graft which had supplied the distal RCA from the 1996 surgery. Patent SVG supplying the distal RCA from the 2007 surgery with a stent in the proximal portion of the graft with 30% narrowings in the proximal and mid segment. Occluded vein graft from 2007 which appears to also have been stented. LVEDP 18 mm  Admitted 07/15/22 due to midsternal chest pain. Heparin drip and medical management. Admitted 05/28/22 due to a/c HF.  Diuresed with lasix gtt. Cardiology/ nephrology consults obtained.   He presents today for a HF follow-up visit with a chief complaint of moderate SOB with minimal exertion. Describes this as chronic in nature. Has associated fatigue, weakness and difficulty sleeping along with this. Denies any dizziness, abdominal distention, palpitations, pedal edema, chest pain, cough or weight gain.   Still waiting to have a port placed in his abdomen as he will be starting peritoneal dialysis in the future.   Past Medical History:  Diagnosis Date   AAA (abdominal aortic aneurysm) ([place])    a. 3cm by [place] 2015.   Arthritis    "hips; back" (12/13/2014)   CAD (coronary artery disease) 2007   a. s/p CABG- IMA-LAD, VG-Cx, VG-RCA, VG-diag in 1999. B. sp redo CABG- VG-OM, VG-RCA in 2007 due to VG disease. c. NSTEMI 11/2014 s/p DES to SVG-OM from the Y graft.d. PTCA/DES x 1 distal body of SVG to Diagonal.09/2015   Chronic combined systolic and diastolic CHF (congestive heart failure) ([place])    a. remote EF 40-45% in 2006. b. Normal EF 2014. b. Echo 07/2016 EF 45-50%, grade 1 DD. c. Echo 2020 30% to 35%. Diffuse EF 30-35%, diffuse hypokinesis.   Chronic lower back pain    CKD (chronic kidney disease), stage IV (HCC)    COPD (chronic obstructive pulmonary disease) (HCC)    Deafness in left ear    Degenerative disc disease, lumbar    Diabetes mellitus, type 2 ([place]) 10/04/2014   Microalbumin 05/11/2012-100. Foot exam/monofilament 05/11/2012-normal.  Dilated cardiomyopathy ([place]) 10/07/2015   Emphysema    Esophageal stricture 07/02/1998   EGD   Genital candidiasis in male 10/25/2012   GERD (gastroesophageal reflux disease)    History of gout    "last flareup was in 2007" (12/13/2014)   History of hiatal hernia    Hyperlipidemia    Hypertension    Ischemic cardiomyopathy 2006   Echo 2020: EF 30-35%, diffuse hypokinesis   NSTEMI (non-ST elevated myocardial infarction) ([place]) 12/13/2014   PVC's (premature ventricular  contractions)    Renal artery stenosis ([place])    a. noted on CT 2008.   Type II diabetes mellitus ([place])    Diet control    Unstable angina ([place]) 07/27/2017   Walking pneumonia 1990's   Wears dentures    full upper   Past Surgical History:  Procedure Laterality Date   CARDIAC CATHETERIZATION  "several"   CARDIAC CATHETERIZATION N/A 12/13/2014   Procedure: Left Heart Cath and Coronary Angiography;  Surgeon: Jettie Booze, MD;  Location: [facility];  Service: Cardiovascular;  Laterality: N/A;   CARDIAC CATHETERIZATION  12/13/2014   Procedure: Coronary Stent Intervention;  Surgeon: Jettie Booze, MD;  Location: [facility];  Service: Cardiovascular;;   CARDIAC CATHETERIZATION N/A 10/07/2015   Procedure: Left Heart Cath and Cors/Grafts Angiography;  Surgeon: Burnell Blanks, MD;  Location: [facility];  Service: Cardiovascular;  Laterality: N/A;   CARDIAC CATHETERIZATION N/A 10/07/2015   Procedure: Coronary Stent Intervention;  Surgeon: Burnell Blanks, MD;  Location: [facility];  Service: Cardiovascular;  Laterality: N/A;   CARDIOVERSION N/A 05/25/2022   Procedure: CARDIOVERSION;  Surgeon: Berniece Salines, DO;  Location: [place] [facility];  Service: Cardiovascular;  Laterality: N/A;   CATARACT EXTRACTION W/PHACO Left 10/21/2021   Procedure: CATARACT EXTRACTION PHACO AND INTRAOCULAR LENS PLACEMENT (IOC) LEFT 3.94 00:33.7;  Surgeon: Eulogio Bear, MD;  Location: [place] [facility];  Service: Ophthalmology;  Laterality: Left;   CATARACT EXTRACTION W/PHACO Right 11/09/2021   Procedure: CATARACT EXTRACTION PHACO AND INTRAOCULAR LENS PLACEMENT (IOC) RIGHT;  Surgeon: Eulogio Bear, MD;  Location: [place];  Service: Ophthalmology;  Laterality: Right;  3.59 0:29.4   CORONARY ANGIOPLASTY  "several"   CORONARY ANGIOPLASTY WITH STENT PLACEMENT  2005; 12/13/2014   "2; 1"   CORONARY ARTERY BYPASS GRAFT  1996   CABG X5   CORONARY ARTERY  BYPASS GRAFT  March 2007   CABG X3   ESOPHAGOGASTRODUODENOSCOPY (EGD) WITH ESOPHAGEAL DILATION  2000   GREEN LIGHT LASER TURP (TRANSURETHRAL RESECTION OF PROSTATE  2000's   "not cancerous"   HERNIA REPAIR     LAPAROSCOPIC CHOLECYSTECTOMY     LEFT HEART CATH AND CORS/GRAFTS ANGIOGRAPHY N/A 07/28/2017   Procedure: LEFT HEART CATH AND CORS/GRAFTS ANGIOGRAPHY;  Surgeon: Troy Sine, MD;  Location: [facility];  Service: Cardiovascular;  Laterality: N/A;   LUNG SURGERY  1996   "S/P CABG, had to put staple in lung after it had collapsed"   UMBILICAL HERNIA REPAIR     w/chole   Family History  Problem Relation Age of Onset   Heart attack Mother        MI   Stroke Mother    Heart disease Mother    Hypertension Mother    Hyperlipidemia Mother    Asthma Mother    Heart disease Father    Rheumatic fever Father    Colon cancer Neg Hx    Social History   Tobacco  Use   Smoking status: Former    Packs/day: 3.00    Years: 20.00    Total pack years: 60.00    Types: Cigarettes    Quit date: 07/25/1986    Years since quitting: 36.0   Smokeless tobacco: Never  Substance Use Topics   Alcohol use: No    Alcohol/week: 0.0 standard drinks of alcohol   Allergies  Allergen Reactions   Predicort [Prednisolone] Other (See Comments)    Stomach pain   Ciprofloxacin Other (See Comments)    GI upset   Hydrochlorothiazide Other (See Comments)    Dehydration   Hydrocodone Nausea Only and Other (See Comments)    Stomach upset    Hydrocodone-Acetaminophen Nausea Only    Stomach upset   Sulfa Antibiotics Other (See Comments)    Cannot recall   Hydrocodone-Acetaminophen Nausea Only and Other (See Comments)    Stomach upset   Penicillins Hives, Rash and Other (See Comments)    Has patient had a PCN reaction causing immediate rash, facial/tongue/throat swelling, SOB or lightheadedness with hypotension: YES  Has patient had a PCN reaction causing severe rash involving mucus membranes or  skin necrosis: NO  Has patient had a PCN reaction that required hospitalization NO  Has patient had a PCN reaction occurring within the last 10 years:NO  If all of the above answers are "NO", then may proceed with Cephalosporin use.  Has patient had a PCN reaction causing immediate rash, facial/tongue/throat swelling, SOB or lightheadedness with hypotension: YES Has patient had a PCN reaction causing severe rash involving mucus membranes or skin necrosis: NO Has patient had a PCN reaction that required hospitalization NO Has patient had a PCN reaction occurring within the last 10 years:NO If all of the above answers are "NO", then may proceed with Cephalosporin use.   Prior to Admission medications   Medication Sig Start Date End Date Taking? Authorizing Provider  acetaminophen (TYLENOL) 650 MG CR tablet Take 1,300 mg by mouth at bedtime.   Yes [provider]  allopurinol (ZYLOPRIM) 100 MG tablet TAKE 1 TABLET BY MOUTH EVERYDAY AT BEDTIME 01/11/22  Yes Eulas Post, MD  amiodarone (PACERONE) 200 MG tablet Take 200 mg by mouth daily.   Yes [provider]  apixaban (ELIQUIS) 5 MG TABS tablet Take 1 tablet (5 mg total) by mouth 2 (two) times daily. 09/09/21  Yes Burnell Blanks, MD  atorvastatin (LIPITOR) 40 MG tablet Take 1 tablet (40 mg total) by mouth at bedtime. Home med. 07/17/22  Yes Enzo Bi, MD  Cholecalciferol (VITAMIN D-3) 125 MCG (5000 UT) TABS Take 2,000 Units by mouth daily.    Yes [provider]  furosemide (LASIX) 40 MG tablet Take 1.5 tablets (60 mg total) by mouth daily. Increased from 40 mg. 07/17/22 10/15/22 Yes Enzo Bi, MD  isosorbide mononitrate (IMDUR) 60 MG 24 hr tablet Take 1 tablet (60 mg total) by mouth daily. Increased from 30 mg. 07/17/22 10/15/22 Yes Enzo Bi, MD  levothyroxine (SYNTHROID) 75 MCG tablet Take 1 tablet (75 mcg total) by mouth daily before breakfast. 01/11/22  Yes Eulas Post, MD  loratadine (CLARITIN) 10 MG  tablet Take 10 mg by mouth daily.   Yes [provider]  metoprolol tartrate (LOPRESSOR) 100 MG tablet Take 1.5 tablets (150 mg total) by mouth 2 (two) times daily. Increased from 100 mg twice daily. 07/17/22 10/15/22 Yes Enzo Bi, MD  Multiple Vitamin (MULTIVITAMIN PO) Take 1 tablet by mouth daily.   Yes [provider]  nitroGLYCERIN (NITROSTAT) 0.4 MG SL tablet Place 0.4 mg under the tongue every 5 (five) minutes as needed for chest pain (Up to 3 times).   Yes [provider]  pantoprazole (PROTONIX) 40 MG tablet Take 1 tablet (40 mg total) by mouth 2 (two) times daily. Home med. 07/17/22  Yes Enzo Bi, MD  polyethylene glycol (MIRALAX / GLYCOLAX) 17 g packet Take 17 g by mouth at bedtime.   Yes [provider]  sodium bicarbonate 650 MG tablet Take 650 mg by mouth 2 (two) times daily.   Yes [provider]    Review of Systems  Constitutional:  Positive for fatigue. Negative for appetite change.  HENT:  Positive for hearing loss. Negative for congestion.   Eyes: Negative.   Respiratory:  Positive for shortness of breath (easily). Negative for cough and chest tightness.   Cardiovascular:  Negative for chest pain, palpitations and leg swelling.  Gastrointestinal:  Negative for abdominal distention and abdominal pain.  Endocrine: Negative.   Genitourinary: Negative.   Musculoskeletal:  Negative for back pain and neck pain.  Skin: Negative.   Allergic/Immunologic: Negative.   Neurological:  Positive for weakness. Negative for dizziness and light-headedness.  Hematological:  Negative for adenopathy. Does not bruise/bleed easily.  Psychiatric/Behavioral:  Positive for sleep disturbance (sleeping on 2 pillows; ate cake last night.). Negative for dysphoric mood. The patient is not nervous/anxious.    Vitals:   07/26/22 1510  BP: 113/74  Pulse: 96  SpO2: 99%  Weight: 225 lb (102.1 kg)   Wt Readings from Last 3 Encounters:  07/26/22 225 lb (102.1  kg)  07/26/22 225 lb (102.1 kg)  07/15/22 225 lb (102.1 kg)   Lab Results  Component Value Date   CREATININE 4.21 (H) 07/17/2022   CREATININE 4.21 (H) 07/16/2022   CREATININE 4.15 (H) 07/15/2022   Physical Exam Vitals and nursing note reviewed. Exam conducted with a chaperone present (wife).  Constitutional:      Appearance: Normal appearance.  HENT:     Head: Normocephalic and atraumatic.  Cardiovascular:     Rate and Rhythm: Normal rate. Rhythm irregular.  Pulmonary:     Effort: Pulmonary effort is normal. No respiratory distress.     Breath sounds: No wheezing, rhonchi or rales.  Abdominal:     General: There is no distension.     Palpations: Abdomen is soft.     Tenderness: There is no abdominal tenderness.  Musculoskeletal:        General: No tenderness.     Cervical back: Normal range of motion and neck supple.     Right lower leg: No edema.     Left lower leg: No edema.  Skin:    General: Skin is warm and dry.  Neurological:     General: No focal deficit present.     Mental Status: He is alert and oriented to person, place, and time.  Psychiatric:        Mood and Affect: Mood normal.        Behavior: Behavior normal.        Thought Content: Thought content normal.   Assessment & Plan:  1: Chronic heart failure with reduced ejection fraction- - NYHA class II - euvolemic - weighing daily; reminded to call for an overnight weight gain of > 2 pounds or a weekly weight gain of > 5 pounds - weight down 7 pounds from last visit here 3 weeks ago - not adding salt and reading  food labels regarding sodium content - metoprolol tartrate '150mg'$  BID - furosemide '60mg'$  daily - CKD limits other GDMT - BNP 05/28/22 was 788.6  2: HTN w/ CKD- - BP  - saw nephrology Candiss Norse) 07/22/22 - will be getting ready to start peritoneal dialysis - BMP 07/17/22 showed sodium 140, potassium 3.8, creatinine 4.21 and GFR 14 - will be having port placed to start peritoneal diaylsis  3:  DM- - saw PCP Army Melia) 04/06/22 - A1c 05/29/22 was 6.1%  4: PAF- - saw cardiology Angelena Form) 07/07/22; returns 01/28/23 - cardioverted 04/2022 - amiodareon '200mg'$  daily - apixaban '5mg'$  BID   Medication list reviewed.  Return in 3 months, sooner if needed.

## 2022-07-26 ENCOUNTER — Encounter: Payer: Self-pay | Admitting: Cardiovascular Disease

## 2022-07-26 ENCOUNTER — Ambulatory Visit (INDEPENDENT_AMBULATORY_CARE_PROVIDER_SITE_OTHER): Payer: Medicare Other | Admitting: Internal Medicine

## 2022-07-26 ENCOUNTER — Ambulatory Visit: Payer: Medicare Other | Attending: Family | Admitting: Family

## 2022-07-26 ENCOUNTER — Encounter: Payer: Self-pay | Admitting: Internal Medicine

## 2022-07-26 ENCOUNTER — Encounter: Payer: Self-pay | Admitting: Family

## 2022-07-26 VITALS — BP 124/60 | HR 100 | Ht 69.0 in | Wt 225.0 lb

## 2022-07-26 VITALS — BP 113/74 | HR 96 | Wt 225.0 lb

## 2022-07-26 DIAGNOSIS — E785 Hyperlipidemia, unspecified: Secondary | ICD-10-CM | POA: Insufficient documentation

## 2022-07-26 DIAGNOSIS — Z8679 Personal history of other diseases of the circulatory system: Secondary | ICD-10-CM | POA: Diagnosis not present

## 2022-07-26 DIAGNOSIS — I480 Paroxysmal atrial fibrillation: Secondary | ICD-10-CM

## 2022-07-26 DIAGNOSIS — Z955 Presence of coronary angioplasty implant and graft: Secondary | ICD-10-CM | POA: Insufficient documentation

## 2022-07-26 DIAGNOSIS — Z79899 Other long term (current) drug therapy: Secondary | ICD-10-CM | POA: Insufficient documentation

## 2022-07-26 DIAGNOSIS — I2510 Atherosclerotic heart disease of native coronary artery without angina pectoris: Secondary | ICD-10-CM | POA: Insufficient documentation

## 2022-07-26 DIAGNOSIS — Z951 Presence of aortocoronary bypass graft: Secondary | ICD-10-CM | POA: Insufficient documentation

## 2022-07-26 DIAGNOSIS — I5022 Chronic systolic (congestive) heart failure: Secondary | ICD-10-CM | POA: Diagnosis not present

## 2022-07-26 DIAGNOSIS — I132 Hypertensive heart and chronic kidney disease with heart failure and with stage 5 chronic kidney disease, or end stage renal disease: Secondary | ICD-10-CM | POA: Diagnosis not present

## 2022-07-26 DIAGNOSIS — K219 Gastro-esophageal reflux disease without esophagitis: Secondary | ICD-10-CM | POA: Insufficient documentation

## 2022-07-26 DIAGNOSIS — I120 Hypertensive chronic kidney disease with stage 5 chronic kidney disease or end stage renal disease: Secondary | ICD-10-CM | POA: Diagnosis not present

## 2022-07-26 DIAGNOSIS — Z87891 Personal history of nicotine dependence: Secondary | ICD-10-CM | POA: Diagnosis not present

## 2022-07-26 DIAGNOSIS — I5020 Unspecified systolic (congestive) heart failure: Secondary | ICD-10-CM

## 2022-07-26 DIAGNOSIS — I130 Hypertensive heart and chronic kidney disease with heart failure and stage 1 through stage 4 chronic kidney disease, or unspecified chronic kidney disease: Secondary | ICD-10-CM | POA: Insufficient documentation

## 2022-07-26 DIAGNOSIS — N185 Chronic kidney disease, stage 5: Secondary | ICD-10-CM

## 2022-07-26 DIAGNOSIS — I10 Essential (primary) hypertension: Secondary | ICD-10-CM

## 2022-07-26 DIAGNOSIS — R7303 Prediabetes: Secondary | ICD-10-CM | POA: Diagnosis not present

## 2022-07-26 DIAGNOSIS — E1122 Type 2 diabetes mellitus with diabetic chronic kidney disease: Secondary | ICD-10-CM

## 2022-07-26 DIAGNOSIS — N184 Chronic kidney disease, stage 4 (severe): Secondary | ICD-10-CM | POA: Insufficient documentation

## 2022-07-26 NOTE — Assessment & Plan Note (Signed)
A1c remains below 6.5 On no medications Continue low carb diet

## 2022-07-26 NOTE — Assessment & Plan Note (Addendum)
S/p cardioversion; with some recurrent Afib-flutter On higher dose lopressor without side effects Seeing cardiology this afternoon

## 2022-07-26 NOTE — Progress Notes (Unsigned)
Cardiology Clinic Note   Patient Name: Ronald [name] [facility] Date of Encounter: 07/27/2022  Primary Care Provider:  Glean Hess, MD Primary Cardiologist:  Lauree Chandler, MD  Patient Profile    77 year old male with a past medical history of CAD s/p CABG in 1996, ischemic cardiomyopathy, CKD, type II diabetes, HTN, HLD, AAA, renal artery stenosis, COPD, GERD, atrial fibrillation, who is here today for hospital follow-up. [name]   Past Medical History    Past Medical History:  Diagnosis Date   AAA (abdominal aortic aneurysm) ([place])    a. 3cm by [place] 2015.   Arthritis    "hips; back" (12/13/2014)   CAD (coronary artery disease) 2007   a. s/p CABG- IMA-LAD, VG-Cx, VG-RCA, VG-diag in 1999. B. sp redo CABG- VG-OM, VG-RCA in 2007 due to VG disease. c. NSTEMI 11/2014 s/p DES to SVG-OM from the Y graft.d. PTCA/DES x 1 distal body of SVG to Diagonal.09/2015   Chronic combined systolic and diastolic CHF (congestive heart failure) ([place])    a. remote EF 40-45% in 2006. b. Normal EF 2014. b. Echo 07/2016 EF 45-50%, grade 1 DD. c. Echo 2020 30% to 35%. Diffuse EF 30-35%, diffuse hypokinesis.   Chronic lower back pain    CKD (chronic kidney disease), stage IV (HCC)    COPD (chronic obstructive pulmonary disease) (HCC)    Deafness in left ear    Degenerative disc disease, lumbar    Diabetes mellitus, type 2 ([place]) 10/04/2014   Microalbumin 05/11/2012-100. Foot exam/monofilament 05/11/2012-normal.   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 noted on CT 2008.   Type II diabetes mellitus ([place])    Diet control    Unstable angina ([place]) 07/27/2017   Walking pneumonia 1990's   Wears dentures    full upper   Past Surgical History:  Procedure Laterality Date   CARDIAC CATHETERIZATION  "several"   CARDIAC CATHETERIZATION N/A 12/13/2014   Procedure: Left Heart Cath and Coronary Angiography;  Surgeon: Jettie Booze, MD;  Location: [place] [facility];  Service: Cardiovascular;  Laterality: N/A;   CARDIAC CATHETERIZATION  12/13/2014   Procedure: Coronary Stent Intervention;  Surgeon: Jettie Booze, MD;  Location: [place] [facility];  Service: Cardiovascular;;   CARDIAC CATHETERIZATION N/A 10/07/2015   Procedure: Left Heart Cath and Cors/Grafts Angiography;  Surgeon: Burnell Blanks, MD;  Location: [place] [facility];  Service: Cardiovascular;  Laterality: N/A;   CARDIAC CATHETERIZATION N/A 10/07/2015   Procedure: Coronary Stent Intervention;  Surgeon: Burnell Blanks, MD;  Location: [place] [facility];  Service: Cardiovascular;  Laterality: N/A;   CARDIOVERSION N/A 05/25/2022   Procedure: CARDIOVERSION;  Surgeon: Berniece Salines, DO;  Location: [place];  Service: Cardiovascular;  Laterality: N/A;   CATARACT EXTRACTION W/PHACO Left 10/21/2021   Procedure: CATARACT EXTRACTION PHACO AND INTRAOCULAR LENS PLACEMENT (IOC) LEFT 3.94 00:33.7;  Surgeon: Eulogio Bear, MD;  Location: [place];  Service: Ophthalmology;  Laterality: Left;  CATARACT EXTRACTION W/PHACO Right 11/09/2021   Procedure: CATARACT EXTRACTION PHACO AND INTRAOCULAR LENS PLACEMENT ([place]) RIGHT;  Surgeon: Eulogio Bear, MD;  Location: [place];  Service: Ophthalmology;  Laterality: Right;  3.59 0:29.4   CORONARY ANGIOPLASTY  "several"   CORONARY ANGIOPLASTY WITH STENT PLACEMENT  2005; 12/13/2014   "2; 1"   CORONARY ARTERY BYPASS GRAFT  1996   CABG X5   CORONARY ARTERY BYPASS GRAFT  March 2007   CABG X3    ESOPHAGOGASTRODUODENOSCOPY (EGD) WITH ESOPHAGEAL DILATION  2000   GREEN LIGHT LASER TURP (TRANSURETHRAL RESECTION OF PROSTATE  2000's   "not cancerous"   HERNIA REPAIR     LAPAROSCOPIC CHOLECYSTECTOMY     LEFT HEART CATH AND CORS/GRAFTS ANGIOGRAPHY N/A 07/28/2017   Procedure: LEFT HEART CATH AND CORS/GRAFTS ANGIOGRAPHY;  Surgeon: Troy Sine, MD;  Location: [place] [facility];  Service: Cardiovascular;  Laterality: N/A;   LUNG SURGERY  1996   "S/P CABG, had to put staple in lung after it had collapsed"   UMBILICAL HERNIA REPAIR     w/chole    Allergies  Allergies  Allergen Reactions   Predicort [Prednisolone] Other (See Comments)    Stomach pain   Ciprofloxacin Other (See Comments)    GI upset   Hydrochlorothiazide Other (See Comments)    Dehydration   Hydrocodone Nausea Only and Other (See Comments)    Stomach upset    Hydrocodone-Acetaminophen Nausea Only    Stomach upset   Sulfa Antibiotics Other (See Comments)    Cannot recall   Hydrocodone-Acetaminophen Nausea Only and Other (See Comments)    Stomach upset   Penicillins Hives, Rash and Other (See Comments)    Has patient had a PCN reaction causing immediate rash, facial/tongue/throat swelling, SOB or lightheadedness with hypotension: YES  Has patient had a PCN reaction causing severe rash involving mucus membranes or skin necrosis: NO  Has patient had a PCN reaction that required hospitalization NO  Has patient had a PCN reaction occurring within the last 10 years:NO  If all of the above answers are "NO", then may proceed with Cephalosporin use.  Has patient had a PCN reaction causing immediate rash, facial/tongue/throat swelling, SOB or lightheadedness with hypotension: YES Has patient had a PCN reaction causing severe rash involving mucus membranes or skin necrosis: NO Has patient had a PCN reaction that required hospitalization NO Has patient had a PCN reaction occurring within the last 10 years:NO If all of  the above answers are "NO", then may proceed with Cephalosporin use.    History of Present Illness    Ronald [name] is a 77 year old male with previously mentioned complex past medical history of coronary artery disease status post CABG in 1996 (LIMA-LAD, SVG-circumflex, SVG-RCA, SVG-diagonal) with redo bypass in 2007 (SVG-OM, SVG-RCA), ischemic cardiomyopathy, CKD, type 2 diabetes, hypertension, hyperlipidemia, AAA, renal artery stenosis, COPD, gastroesophageal reflux disease, paroxysmal atrial fibrillation with PVCs/aberrancies.  Originally hospitalized at [facility] in July 2016 with an NSTEMI.  Cardiac catheterization with severe disease in the SVG to OM treated with DES.  He was treated with Brilinta post PCI but developed dyspnea and had to be changed to clopidogrel.  He was admitted to [facility] in May 2017 with unstable angina cardiac catheter showed severe disease in the distal body/anastomosis of the SVG to the diagonal.  DES was placed in this vein graft.  The LIMA to the LAD was patent, SVG to OM was patent, SVG to PDA/PLA was patent.  He  was admitted to [facility] February 2018 with chest pain.  Troponin was negative x 3.  He did not have an ischemic evaluation at that time.  Echocardiogram in 07/2016 with LVEF of 45-50%.  He was admitted to [facility] in February 2019 with unstable angina and cardiac cath showed patency of all bypass grafts.  Repeat echocardiogram revealed LV systolic function that was normal with LVEF 55 to 60%, G1 DD.  He was admitted to [facility] in January 2020 with atrial fibrillation with RVR.  At that time he was started on Xarelto and rate controlled with IV Cardizem.  He was loaded with IV amiodarone and discharged home with p.o. amiodarone and Lopressor.  And clopidogrel and Norvasc were stopped at discharge.  His Norvasc was restarted and primary care at his follow-up visit.  In January 2020 echocardiogram revealed LVEF of 30-35%, with mild MR.  Abdominal ultrasound in April 2022  revealed a 3.1 cm AAA that was stable.  He was seen in December 2023 and was noted to be in atrial flutter.  Amiodarone was increased but he did not convert to sinus.  He was cardioverted in 05/25/2022.  He was admitted to [facility] on 05/19/2022 with dyspnea, chest pain, and lower extremity edema.  Repeat echocardiogram done 05/19/2022 revealed LVEF of 35-40% with global hypokinesis unchanged with mild MR and aortic valve sclerosis without stenosis.  He was diuresed and irbesartan was stopped.  He was seen by Dr. Lovena Le while he was admitted.  He was seen in A-fib clinic on 06/17/2022 and was back in atrial fibrillation.  His amiodarone was loaded to 200 mg daily.  Appointment was made with Dr. Lovena Le within the next week to review his options for further medical therapy as he was not felt to be a good candidate for the ablation procedure.  Scented to the [facility] emergency department on 07/15/2022 with complaint of chest pain.  Globin 9.9 which is chronic likely due to his chronic kidney disease, serum creatinine of 4.15 which was considered stable.  First troponin was negative, BNP of 684, chest x-ray revealed and interpreted shows cardiomegaly and vascular congestion.  He was given aspirin, heparin, and Lasix.  He was admitted to the hospital service with concerns for unstable angina.  Apixaban was held during his hospitalization he was started on a heparin drip with the understanding for him to be restarted on apixaban prior to discharge.  He is also scheduled to see Dr. Lovena Le with the EP for atrial fibrillation and atrial flutter.  He was continued on the majority of his home medications but he was considered not a candidate for GDMT given his renal dysfunction.  He was considered stable for discharge on 07/17/2022 with outpatient follow-ups that were made for continuum of care.  He had follow-up with nephrology when he was in the hospital because there is discussion about him proceeding with peritoneal dialysis.  He  returns to clinic today accompanied by his wife. He states that since discharge from the hospital he continues to have some shortness of breath. He denies any chest pain or palpitations. He does have some peripheral edema. Has recently been sleeping in the recliner. He has been compliant with his current medications. He denies any blood noted in his urine of stool. He also needs clearance for PD catheter placement.    Home Medications    Current Outpatient Medications  Medication Sig Dispense Refill   acetaminophen (TYLENOL) 650 MG CR tablet Take 1,300 mg by mouth at bedtime.  allopurinol (ZYLOPRIM) 100 MG tablet TAKE 1 TABLET BY MOUTH EVERYDAY AT BEDTIME 90 tablet 3   amiodarone (PACERONE) 200 MG tablet Take 200 mg by mouth daily.     apixaban (ELIQUIS) 5 MG TABS tablet Take 1 tablet (5 mg total) by mouth 2 (two) times daily. 180 tablet 1   atorvastatin (LIPITOR) 40 MG tablet Take 1 tablet (40 mg total) by mouth at bedtime. Home med.     Cholecalciferol (VITAMIN D-3) 125 MCG (5000 UT) TABS Take 2,000 Units by mouth daily.      furosemide (LASIX) 40 MG tablet Take 1.5 tablets (60 mg total) by mouth daily. Increased from 40 mg. 45 tablet 2   isosorbide mononitrate (IMDUR) 60 MG 24 hr tablet Take 1 tablet (60 mg total) by mouth daily. Increased from 30 mg. 30 tablet 2   levothyroxine (SYNTHROID) 75 MCG tablet Take 1 tablet (75 mcg total) by mouth daily before breakfast. 90 tablet 2   loratadine (CLARITIN) 10 MG tablet Take 10 mg by mouth daily.     metoprolol tartrate (LOPRESSOR) 100 MG tablet Take 1.5 tablets (150 mg total) by mouth 2 (two) times daily. Increased from 100 mg twice daily. 90 tablet 2   Multiple Vitamin (MULTIVITAMIN PO) Take 1 tablet by mouth daily.     nitroGLYCERIN (NITROSTAT) 0.4 MG SL tablet Place 0.4 mg under the tongue every 5 (five) minutes as needed for chest pain (Up to 3 times).     pantoprazole (PROTONIX) 40 MG tablet Take 1 tablet (40 mg total) by mouth 2 (two) times  daily. Home med.     polyethylene glycol (MIRALAX / GLYCOLAX) 17 g packet Take 17 g by mouth at bedtime.     sodium bicarbonate 650 MG tablet Take 650 mg by mouth 2 (two) times daily.     No current facility-administered medications for this visit.     Family History    Family History  Problem Relation Age of Onset   Heart attack Mother        MI   Stroke Mother    Heart disease Mother    Hypertension Mother    Hyperlipidemia Mother    Asthma Mother    Heart disease Father    Rheumatic fever Father    Colon cancer Neg Hx    He indicated that his mother is deceased. He indicated that his father is deceased. He indicated that his brother is alive. He indicated that his maternal grandmother is deceased. He indicated that his maternal grandfather is deceased. He indicated that his paternal grandmother is deceased. He indicated that his paternal grandfather is deceased. He indicated that both of his sons are alive. He indicated that the status of his neg hx is unknown.  Social History    Social History   Socioeconomic History   Marital status: Married    Spouse name: Elon Swetland   Number of children: 2   Years of education: Not on file   Highest education level: 8th grade  Occupational History   Occupation: Retired  Tobacco Use   Smoking status: Former    Packs/day: 3.00    Years: 20.00    Total pack years: 60.00    Types: Cigarettes    Quit date: 07/25/1986    Years since quitting: 36.0   Smokeless tobacco: Never  Vaping Use   Vaping Use: Never used  Substance and Sexual Activity   Alcohol use: No    Alcohol/week: 0.0 standard drinks of alcohol  Drug use: No   Sexual activity: Not Currently  Other Topics Concern   Not on file  Social History Narrative   Did auto salvage work.   Lives at home with his wife.  Independent at baseline.   Social Determinants of Health   Financial Resource Strain: Low Risk  (06/25/2022)   Overall Financial Resource Strain (CARDIA)     Difficulty of Paying Living Expenses: Not hard at all  Food Insecurity: No Food Insecurity (07/16/2022)   Hunger Vital Sign    Worried About Running Out of Food in the Last Year: Never true    Ran Out of Food in the Last Year: Never true  Transportation Needs: No Transportation Needs (07/16/2022)   PRAPARE - Hydrologist (Medical): No    Lack of Transportation (Non-Medical): No  Physical Activity: Inactive (06/25/2022)   Exercise Vital Sign    Days of Exercise per Week: 0 days    Minutes of Exercise per Session: 0 min  Stress: No Stress Concern Present (06/25/2022)   [place]    Feeling of Stress : Not at all  Social Connections: Moderately Isolated (06/25/2022)   Social Connection and Isolation Panel [NHANES]    Frequency of Communication with Friends and Family: Twice a week    Frequency of Social Gatherings with Friends and Family: Once a week    Attends Religious Services: Never    Marine scientist or Organizations: No    Attends Archivist Meetings: Never    Marital Status: Married  Human resources officer Violence: Not At Risk (07/16/2022)   Humiliation, Afraid, Rape, and Kick questionnaire    Fear of Current or Ex-Partner: No    Emotionally Abused: No    Physically Abused: No    Sexually Abused: No     Review of Systems    General:  No chills, fever, night sweats or weight changes. Endorses fatigue Cardiovascular:  No chest pain, endorses dyspnea on exertion, edema, but denies orthopnea, palpitations, paroxysmal nocturnal dyspnea. Dermatological: No rash, lesions/masses Respiratory: No cough, endorses dyspnea Urologic: No hematuria, dysuria Abdominal:   No nausea, vomiting, diarrhea, bright red blood per rectum, melena, or hematemesis Neurologic:  No visual changes, wkns, changes in mental status. All other systems reviewed and are otherwise negative except as noted  above.   Physical Exam    VS:  BP 129/78 (BP Location: Left Arm, Patient Position: Sitting, Cuff Size: Normal)   Pulse 96   Ht '5\' 9"'$  (1.753 m)   Wt 226 lb 6.4 oz (102.7 kg)   SpO2 97%   BMI 33.43 kg/m  , BMI Body mass index is 33.43 kg/m.     GEN: Well nourished, well developed, in no acute distress. HEENT: normal. Glasses on. Neck: Supple, no JVD, carotid bruits, or masses. Cardiac: RRR, no murmurs, rubs, or gallops. No clubbing, cyanosis, edema.  Radials 2+/PT 2+ and equal bilaterally.  Respiratory:  Respirations regular and unlabored, clear to auscultation bilaterally. GI: Soft, nontender, obese, nondistended, BS + x 4. MS: no deformity or atrophy. Skin: warm and dry, no rash. Neuro:  Strength and sensation are intact. Psych: Normal affect.  Accessory Clinical Findings    ECG personally reviewed by me today- sinus 96, IVCD, LVH, ST and T wave changes in lateral leads - No acute changes  Lab Results  Component Value Date   WBC 5.5 07/17/2022   HGB 9.6 (L) 07/17/2022   HCT  30.1 (L) 07/17/2022   MCV 86.2 07/17/2022   PLT 121 (L) 07/17/2022   Lab Results  Component Value Date   CREATININE 4.21 (H) 07/17/2022   BUN 67 (H) 07/17/2022   NA 140 07/17/2022   K 3.8 07/17/2022   CL 107 07/17/2022   CO2 23 07/17/2022   Lab Results  Component Value Date   ALT 43 05/28/2022   AST 46 (H) 05/28/2022   ALKPHOS 126 05/28/2022   BILITOT 1.4 (H) 05/28/2022   Lab Results  Component Value Date   CHOL 109 07/16/2022   HDL 37 (L) 07/16/2022   LDLCALC 59 07/16/2022   TRIG 63 07/16/2022   CHOLHDL 2.9 07/16/2022    Lab Results  Component Value Date   HGBA1C 6.1 (H) 05/29/2022    Assessment & Plan   1.  Coronary artery disease with stable angina s/p CABG and repeat catheterization with stent placement. Denies any current chest pain. No ischemic changes noted on EKG. Continued on Imdur 60 mg daily, atorvastatin 40 mg daily, and eliquis 5 mg twice daily in lieu of ASA. No  further ischemic testing needed prior to PD catheter insertion.   2. Paroxysmal atrial fibrillation/ atrial flutter which is currently in sinus on EKG. S/p DCCV 05/25/22, shortly back in a-fib after with referral to a-fib clinic, he has been continued on eliquis 5 mg twice daily for CHA2DS2-VASc score of at least 6, metoprolol 150 mg twice daily and amiodarone 200 mg daily. CHA2DS2-VASc Score = 6   This indicates a 9.7% annual risk of stroke. The patient's score is based upon: CHF History: 1 HTN History: 1 Diabetes History: 1 Stroke History: 0 Vascular Disease History: 1 Age Score: 2 Gender Score: 0     3. HFrEF with last LVEF 35-40% 12/23. Continues with shortness of breath today likely a component of needing to start dialysis. He was just seen by Heart Failure Clinic. He was been continued on furosemide 60 mg daily and metoprolol titrate 150 mg twice daily.  He is not a candidate for goal-directed medical therapy given renal dysfunction so we are unable to place him on ACE/ARB/ARNI, Jardiance/Farxiga on hold  4.  Essential hypertension with blood pressure today 129/78.  Continues to remain stable.  He is continued on metoprolol, furosemide, and Imdur.  5.  Hyperlipidemia with LDL of 58 which is at goal.  He has been continued on atorvastatin 40 mg daily.  6. CKD stage 4 with last serum creatinine 4.21 which is his baseline. This continues to be followed by Nephrology as he is awaiting PD catheter placement.  7.  Preoperative clearance for placement of peritoneal dialysis catheter.  Primary cardiologist Dr. Angelena Form has written correspondence that has been sent to [facility] stating that the patient does not need to delay placement of catheter for dialysis therapy for any further ischemic workup at this time.  Copy of the letter was also given to the patient today to help with them when he goes for his appointment.  Twain Stenseth, NP 07/27/2022, 4:28 PM

## 2022-07-26 NOTE — Progress Notes (Addendum)
Date:  07/26/2022   Name:  Rajiv Suppes [facility]   DOB:  1945-11-17   MRN:  [id number]   Chief Complaint: Hospitalization Follow-up Hospital follow up - admitted to [facility] 07/15/22 - 07/17/22.  TOC call done on 07/19/22.  Discharge Diagnoses:  Principal Problem:   Chest pain Active Problems:   Unstable angina (HCC)   Essential hypertension   Paroxysmal atrial fibrillation (HCC)   Coronary artery disease   Hypothyroidism   CKD stage 4 due to type 2 diabetes mellitus (HCC)   Chronic systolic CHF (congestive heart failure) (HCC)   Typical atrial flutter [facility])    Hospital Course:  For full details, please see H&P, progress notes, consult notes and ancillary notes.  Briefly,  JEVION KINNETT is a 77 y.o. 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    Angina ([employer]) Severe underlying coronary disease, prior history of CABG, prior catheterization 2019  --trop neg x2 --to rule out abdominal etiology:  pt already on PPI BID PTA, had normal EGD in 2018.  Had cholecystectomy.  [place] upper abdomen no acute finding. --cardio consulted, decision made for medical management and no cardiac cath given CKD4.  Pt received heparin gtt for 72 hours. --home Imdur increased from 30 to 60 mg daily. --cont statin --outpatient f/u with cardiology Dr. Angelena Form in 2-3 weeks.  >>> still shortness of breath but no detected Afib.  No cough or fever.  No further chest pain.  Waiting for possible  cardiac cath after PD catheter placement.  Atrial fibs/flutter Cardioversion May 25, 2022, shortly after back to atrial fibrillation/flutter -Seen by atrial fibrillation clinic January 2024 was in atrial fibrillation at that time.  Was scheduled to see Dr. Lovena Le, [place]. -Presenting in atrial flutter, back-and-forth between atrial fibrillation and atrial flutter -Some of his symptoms of shortness of breath could be secondary to underlying arrhythmia with rates in the mid to high 90s --cont Lopressor at increased 150 mg BID --cont amiodarone --cont home Eliquis  >>>  He is tolerating the higher dose lopressor without side effects.  He is still fatigued.  Essential hypertension Blood pressure is stable Continue metoprolol, lasix, Imdur   Hypothyroidism Stable Continue Synthroid   Chronic systolic CHF (congestive heart failure) (HCC) Not acutely exacerbated Last known LVEF of 35 to 40% from a 2D echocardiogram which was done 12/23 with grade 2 diastolic dysfunction --home lasix increased from 40 mg to 60 mg daily at discharge. --cont Lopressor at increased 150 mg BID --Not a candidate for goal-directed medical therapy given renal dysfunction, ACE, ARB, ARNI, Jardiance/Farxiga on hold    CKD stage 4 due to type 2 diabetes mellitus ([place]) Renal function appears to be at his baseline  --nephro consulted on admission --Patient has an upcoming evaluation for PD catheter placement.    >>>  Awaiting a call to schedule PD catheter placement.  Fatigue likely a combination of anemia,  CKD stage 5 and CAD.  Anemia of chronic kidney disease --Hgb stable in 9's HPI  Lab Results  Component  Value Date   NA 140 07/17/2022   K 3.8 07/17/2022   CO2 23 07/17/2022   GLUCOSE 100 (H) 07/17/2022   BUN 67 (H) 07/17/2022   CREATININE 4.21 (H) 07/17/2022   CALCIUM 9.4 07/17/2022   EGFR 14 (L) 05/19/2022   GFRNONAA 14 (L) 07/17/2022   Lab Results  Component Value Date   CHOL 109 07/16/2022    HDL 37 (L) 07/16/2022   LDLCALC 59 07/16/2022   TRIG 63 07/16/2022   CHOLHDL 2.9 07/16/2022   Lab Results  Component Value Date   TSH 1.951 05/28/2022   Lab Results  Component Value Date   HGBA1C 6.1 (H) 05/29/2022   Lab Results  Component Value Date   WBC 5.5 07/17/2022   HGB 9.6 (L) 07/17/2022   HCT 30.1 (L) 07/17/2022   MCV 86.2 07/17/2022   PLT 121 (L) 07/17/2022   Lab Results  Component Value Date   ALT 43 05/28/2022   AST 46 (H) 05/28/2022   ALKPHOS 126 05/28/2022   BILITOT 1.4 (H) 05/28/2022   No results found for: "25OHVITD2", "25OHVITD3", "VD25OH"   Review of Systems  Constitutional:  Positive for fatigue. Negative for appetite change, chills, diaphoresis and fever.  Respiratory:  Positive for shortness of breath. Negative for cough, chest tightness and wheezing.   Cardiovascular:  Positive for palpitations. Negative for chest pain and leg swelling.  Gastrointestinal:  Positive for abdominal pain. Negative for diarrhea, nausea and vomiting.  Neurological:  Negative for dizziness, light-headedness and headaches.  Psychiatric/Behavioral:  Positive for dysphoric mood. Negative for sleep disturbance. The patient is not nervous/anxious.     Patient Active Problem List   Diagnosis Date Noted   Angina pectoris ([place]) 07/17/2022   Unstable angina ([place]) [id number]   Chronic systolic CHF (congestive heart failure) ([place]) 07/15/2022   Typical atrial flutter ([place]) 07/15/2022   Acute on chronic combined systolic (congestive) and diastolic (congestive) heart failure ([place]) 05/28/2022   Hypothyroidism 05/28/2022   Coronary artery disease 05/28/2022   GERD without esophagitis 05/28/2022   HFrEF (heart failure with reduced ejection fraction) ([place]) 05/05/2022   Essential hypertension 05/05/2022   Prediabetes 04/06/2022   Spinal stenosis of lumbar region without neurogenic claudication 04/06/2022   Weakness of both lower extremities 04/06/2022   Morbid obesity ([place])  04/06/2022   Upper airway cough syndrome 10/01/2021   Localized edema 01/16/2020   Hyperparathyroidism due to renal insufficiency ([place]) 05/18/2019   Bursitis of hip 12/18/2018   Paroxysmal atrial fibrillation ([place]) 06/10/2018   Unspecified atrial fibrillation ([place]) 06/10/2018   CKD stage 5 secondary to hypertension ([place]) 07/29/2017   Atherosclerosis of native arteries of extremity with intermittent claudication ([place]) 06/25/2016   Atherosclerosis of native arteries of extremities with intermittent claudication, unspecified extremity ([place]) 06/25/2016   Dilated cardiomyopathy ([place]) 10/07/2015   Coronary artery disease involving native coronary artery with angina pectoris ([place])    Allergic rhinitis 08/19/2015   Myocardial infarction ([place]) 12/13/2014   Anemia of renal disease 10/04/2014   AA (aortic aneurysm) ([place]) 10/04/2014   Acid reflux 10/04/2014   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   Basal cell papilloma 10/04/2014   Unspecified osteoarthritis, unspecified site 10/04/2014   Thoracic aortic aneurysm (TAA) ([place]) 10/04/2014   Atherosclerotic heart disease of native coronary artery without angina pectoris 10/04/2014   Hypertensive heart disease without CHF 12/30/2012   Benign prostatic hyperplasia with lower urinary tract symptoms 10/24/2012   DOE (dyspnea on exertion) 11/12/2009   Renal artery stenosis     Mixed hyperlipidemia 09/04/2008  History of redo bypass grafting 09/04/2008    Allergies  Allergen Reactions   Predicort [Prednisolone] Other (See Comments)    Stomach pain   Ciprofloxacin Other (See Comments)    GI upset   Hydrochlorothiazide Other (See Comments)    Dehydration   Hydrocodone Nausea Only and Other (See Comments)    Stomach upset    Hydrocodone-Acetaminophen Nausea Only    Stomach upset   Sulfa Antibiotics Other (See Comments)    Cannot recall   Hydrocodone-Acetaminophen Nausea Only and Other (See Comments)    Stomach upset   Penicillins Hives, Rash and Other  (See Comments)    Has patient had a PCN reaction causing immediate rash, facial/tongue/throat swelling, SOB or lightheadedness with hypotension: YES  Has patient had a PCN reaction causing severe rash involving mucus membranes or skin necrosis: NO  Has patient had a PCN reaction that required hospitalization NO  Has patient had a PCN reaction occurring within the last 10 years:NO  If all of the above answers are "NO", then may proceed with Cephalosporin use.  Has patient had a PCN reaction causing immediate rash, facial/tongue/throat swelling, SOB or lightheadedness with hypotension: YES Has patient had a PCN reaction causing severe rash involving mucus membranes or skin necrosis: NO Has patient had a PCN reaction that required hospitalization NO Has patient had a PCN reaction occurring within the last 10 years:NO If all of the above answers are "NO", then may proceed with Cephalosporin use.    Past Surgical History:  Procedure Laterality Date   CARDIAC CATHETERIZATION  "several"   CARDIAC CATHETERIZATION N/A 12/13/2014   Procedure: Left Heart Cath and Coronary Angiography;  Surgeon: Jettie Booze, MD;  Location: [place] [facility];  Service: Cardiovascular;  Laterality: N/A;   CARDIAC CATHETERIZATION  12/13/2014   Procedure: Coronary Stent Intervention;  Surgeon: Jettie Booze, MD;  Location: [place] [facility];  Service: Cardiovascular;;   CARDIAC CATHETERIZATION N/A 10/07/2015   Procedure: Left Heart Cath and Cors/Grafts Angiography;  Surgeon: Burnell Blanks, MD;  Location: [place] [facility];  Service: Cardiovascular;  Laterality: N/A;   CARDIAC CATHETERIZATION N/A 10/07/2015   Procedure: Coronary Stent Intervention;  Surgeon: Burnell Blanks, MD;  Location: [place] [facility];  Service: Cardiovascular;  Laterality: N/A;   CARDIOVERSION N/A 05/25/2022   Procedure: CARDIOVERSION;  Surgeon: Berniece Salines, DO;  Location: [place];  Service: Cardiovascular;   Laterality: N/A;   CATARACT EXTRACTION W/PHACO Left 10/21/2021   Procedure: CATARACT EXTRACTION PHACO AND INTRAOCULAR LENS PLACEMENT (IOC) LEFT 3.94 00:33.7;  Surgeon: Eulogio Bear, MD;  Location: [place];  Service: Ophthalmology;  Laterality: Left;   CATARACT EXTRACTION W/PHACO Right 11/09/2021   Procedure: CATARACT EXTRACTION PHACO AND INTRAOCULAR LENS PLACEMENT (IOC) RIGHT;  Surgeon: Eulogio Bear, MD;  Location: [place];  Service: Ophthalmology;  Laterality: Right;  3.59 0:29.4   CORONARY ANGIOPLASTY  "several"   CORONARY ANGIOPLASTY WITH STENT PLACEMENT  2005; 12/13/2014   "2; 1"   CORONARY ARTERY BYPASS GRAFT  1996   CABG X5   CORONARY ARTERY BYPASS GRAFT  March 2007   CABG X3   ESOPHAGOGASTRODUODENOSCOPY (EGD) WITH ESOPHAGEAL DILATION  2000   GREEN LIGHT LASER TURP (TRANSURETHRAL RESECTION OF PROSTATE  2000's   "not cancerous"   HERNIA REPAIR     LAPAROSCOPIC CHOLECYSTECTOMY     LEFT HEART CATH AND CORS/GRAFTS ANGIOGRAPHY N/A 07/28/2017   Procedure: LEFT HEART CATH AND CORS/GRAFTS ANGIOGRAPHY;  Surgeon: Troy Sine,  MD;  Location: [place] [facility];  Service: Cardiovascular;  Laterality: N/A;   LUNG SURGERY  1996   "S/P CABG, had to put staple in lung after it had collapsed"   UMBILICAL HERNIA REPAIR     w/chole    Social History   Tobacco Use   Smoking status: Former    Packs/day: 3.00    Years: 20.00    Additional pack years: 0.00    Total pack years: 60.00    Types: Cigarettes    Quit date: 07/25/1986    Years since quitting: 36.0   Smokeless tobacco: Never  Vaping Use   Vaping Use: Never used  Substance Use Topics   Alcohol use: No    Alcohol/week: 0.0 standard drinks of alcohol   Drug use: No     Medication list has been reviewed and updated.  Current Meds  Medication Sig   acetaminophen (TYLENOL) 650 MG CR tablet Take 1,300 mg by mouth at bedtime.   allopurinol (ZYLOPRIM) 100 MG tablet TAKE 1 TABLET BY MOUTH EVERYDAY  AT BEDTIME   amiodarone (PACERONE) 200 MG tablet Take 200 mg by mouth daily.   apixaban (ELIQUIS) 5 MG TABS tablet Take 1 tablet (5 mg total) by mouth 2 (two) times daily.   atorvastatin (LIPITOR) 40 MG tablet Take 1 tablet (40 mg total) by mouth at bedtime. Home med.   Cholecalciferol (VITAMIN D-3) 125 MCG (5000 UT) TABS Take 2,000 Units by mouth daily.    furosemide (LASIX) 40 MG tablet Take 1.5 tablets (60 mg total) by mouth daily. Increased from 40 mg.   isosorbide mononitrate (IMDUR) 60 MG 24 hr tablet Take 1 tablet (60 mg total) by mouth daily. Increased from 30 mg.   levothyroxine (SYNTHROID) 75 MCG tablet Take 1 tablet (75 mcg total) by mouth daily before breakfast.   loratadine (CLARITIN) 10 MG tablet Take 10 mg by mouth daily.   metoprolol tartrate (LOPRESSOR) 100 MG tablet Take 1.5 tablets (150 mg total) by mouth 2 (two) times daily. Increased from 100 mg twice daily.   Multiple Vitamin (MULTIVITAMIN PO) Take 1 tablet by mouth daily.   nitroGLYCERIN (NITROSTAT) 0.4 MG SL tablet Place 0.4 mg under the tongue every 5 (five) minutes as needed for chest pain (Up to 3 times).   pantoprazole (PROTONIX) 40 MG tablet Take 1 tablet (40 mg total) by mouth 2 (two) times daily. Home med.   polyethylene glycol (MIRALAX / GLYCOLAX) 17 g packet Take 17 g by mouth at bedtime.   sodium bicarbonate 650 MG tablet Take 650 mg by mouth 2 (two) times daily.   [DISCONTINUED] aspirin EC 81 MG tablet Take 81 mg by mouth once.       07/26/2022    1:48 PM 04/06/2022    3:38 PM  GAD 7 : Generalized Anxiety Score  Nervous, Anxious, on Edge 3 2  Control/stop worrying 3 2  Worry too much - different things 3 3  Trouble relaxing 3 2  Restless 0 2  Easily annoyed or irritable 0 2  Afraid - awful might happen 2 2  Total GAD 7 Score 14 15  Anxiety Difficulty Somewhat difficult Extremely difficult       07/26/2022    1:47 PM 06/25/2022    1:37 PM 04/06/2022    3:38 PM  Depression screen PHQ 2/9  Decreased  Interest 2 0 3  Down, Depressed, Hopeless 2 1 0  PHQ - 2 Score 4 1 3   Altered sleeping 2  3  Tired, decreased energy 0  3  Change in appetite 2  0  Feeling bad or failure about yourself  2  3  Trouble concentrating 0  3  Moving slowly or fidgety/restless 3  0  Suicidal thoughts 0  1  PHQ-9 Score 13  16  Difficult doing work/chores Somewhat difficult  Extremely dIfficult    BP Readings from Last 3 Encounters:  07/27/22 129/78  07/26/22 113/74  07/26/22 124/60    Physical Exam Vitals and nursing note reviewed.  Constitutional:      General: He is not in acute distress.    Appearance: He is well-developed. He is not ill-appearing.  HENT:     Head: Normocephalic and atraumatic.  Cardiovascular:     Rate and Rhythm: Normal rate and regular rhythm.     Heart sounds: No murmur heard. Pulmonary:     Effort: Pulmonary effort is normal. No respiratory distress.     Breath sounds: No wheezing or rhonchi.  Musculoskeletal:     Right lower leg: No edema.     Left lower leg: No edema.  Lymphadenopathy:     Cervical: No cervical adenopathy.  Skin:    General: Skin is warm and dry.     Findings: No rash.  Neurological:     General: No focal deficit present.     Mental Status: He is alert and oriented to person, place, and time.  Psychiatric:        Mood and Affect: Mood normal.        Behavior: Behavior normal.     Wt Readings from Last 3 Encounters:  07/27/22 226 lb 6.4 oz (102.7 kg)  07/26/22 225 lb (102.1 kg)  07/26/22 225 lb (102.1 kg)    BP 124/60   Pulse 100   Ht 5\' 9"  (1.753 m)   Wt 225 lb (102.1 kg)   SpO2 97%   BMI 33.23 kg/m   Assessment and Plan: Problem List Items Addressed This Visit       Cardiovascular and Mediastinum   Paroxysmal atrial fibrillation (HCC) (Chronic)    S/p cardioversion; with some recurrent Afib-flutter On higher dose lopressor without side effects Seeing cardiology this afternoon        Genitourinary   CKD stage 5 secondary  to hypertension ([place])    Planning to start PD Awaiting surgery to place the catheter        Other   Prediabetes - Primary (Chronic)    A1c remains below 6.5 On no medications Continue low carb diet        Partially dictated using Editor, commissioning. Any errors are unintentional.  Halina Maidens, MD [facility]  08/16/2022

## 2022-07-26 NOTE — Patient Instructions (Signed)
Continue weighing daily and call for an overnight weight gain of 3 pounds or more or a weekly weight gain of more than 5 pounds. ? ?

## 2022-07-26 NOTE — Assessment & Plan Note (Signed)
Planning to start PD Awaiting surgery to place the catheter

## 2022-07-26 NOTE — Progress Notes (Signed)
Please see my letter that has been sent to the surgeon.

## 2022-07-27 ENCOUNTER — Encounter: Payer: Self-pay | Admitting: Cardiology

## 2022-07-27 ENCOUNTER — Ambulatory Visit: Payer: Medicare Other | Attending: Cardiology | Admitting: Cardiology

## 2022-07-27 VITALS — BP 129/78 | HR 96 | Ht 69.0 in | Wt 226.4 lb

## 2022-07-27 DIAGNOSIS — I5020 Unspecified systolic (congestive) heart failure: Secondary | ICD-10-CM | POA: Diagnosis not present

## 2022-07-27 DIAGNOSIS — I255 Ischemic cardiomyopathy: Secondary | ICD-10-CM | POA: Diagnosis not present

## 2022-07-27 DIAGNOSIS — I2510 Atherosclerotic heart disease of native coronary artery without angina pectoris: Secondary | ICD-10-CM

## 2022-07-27 DIAGNOSIS — I701 Atherosclerosis of renal artery: Secondary | ICD-10-CM

## 2022-07-27 DIAGNOSIS — E782 Mixed hyperlipidemia: Secondary | ICD-10-CM | POA: Diagnosis not present

## 2022-07-27 DIAGNOSIS — N184 Chronic kidney disease, stage 4 (severe): Secondary | ICD-10-CM

## 2022-07-27 DIAGNOSIS — Z01818 Encounter for other preprocedural examination: Secondary | ICD-10-CM

## 2022-07-27 DIAGNOSIS — I480 Paroxysmal atrial fibrillation: Secondary | ICD-10-CM

## 2022-07-27 DIAGNOSIS — I10 Essential (primary) hypertension: Secondary | ICD-10-CM | POA: Diagnosis not present

## 2022-07-27 NOTE — Patient Instructions (Signed)
Medication Instructions:  No changes at this time.   *If you need a refill on your cardiac medications before your next appointment, please call your pharmacy*   Lab Work: None  If you have labs (blood work) drawn today and your tests are completely normal, you will receive your results only by: [place] (if you have MyChart) OR A paper copy in the mail If you have any lab test that is abnormal or we need to change your treatment, we will call you to review the results.   Testing/Procedures: None   Follow-Up: At [facility], you and your health needs are our priority.  As part of our continuing mission to provide you with exceptional heart care, we have created designated Provider Care Teams.  These Care Teams include your primary Cardiologist (physician) and Advanced Practice Providers (APPs -  Physician Assistants and Nurse Practitioners) who all work together to provide you with the care you need, when you need it.   Your next appointment:   Keep current appointment that are scheduled.

## 2022-07-29 DIAGNOSIS — N2581 Secondary hyperparathyroidism of renal origin: Secondary | ICD-10-CM | POA: Diagnosis not present

## 2022-07-29 DIAGNOSIS — I10 Essential (primary) hypertension: Secondary | ICD-10-CM | POA: Diagnosis not present

## 2022-07-29 DIAGNOSIS — R600 Localized edema: Secondary | ICD-10-CM | POA: Diagnosis not present

## 2022-07-29 DIAGNOSIS — N185 Chronic kidney disease, stage 5: Secondary | ICD-10-CM | POA: Diagnosis not present

## 2022-07-29 DIAGNOSIS — I129 Hypertensive chronic kidney disease with stage 1 through stage 4 chronic kidney disease, or unspecified chronic kidney disease: Secondary | ICD-10-CM | POA: Diagnosis not present

## 2022-08-02 DIAGNOSIS — I129 Hypertensive chronic kidney disease with stage 1 through stage 4 chronic kidney disease, or unspecified chronic kidney disease: Secondary | ICD-10-CM | POA: Diagnosis not present

## 2022-08-02 DIAGNOSIS — N2581 Secondary hyperparathyroidism of renal origin: Secondary | ICD-10-CM | POA: Diagnosis not present

## 2022-08-02 DIAGNOSIS — N185 Chronic kidney disease, stage 5: Secondary | ICD-10-CM | POA: Diagnosis not present

## 2022-08-02 DIAGNOSIS — I10 Essential (primary) hypertension: Secondary | ICD-10-CM | POA: Diagnosis not present

## 2022-08-02 DIAGNOSIS — R600 Localized edema: Secondary | ICD-10-CM | POA: Diagnosis not present

## 2022-08-05 DIAGNOSIS — I129 Hypertensive chronic kidney disease with stage 1 through stage 4 chronic kidney disease, or unspecified chronic kidney disease: Secondary | ICD-10-CM | POA: Diagnosis not present

## 2022-08-05 DIAGNOSIS — I10 Essential (primary) hypertension: Secondary | ICD-10-CM | POA: Diagnosis not present

## 2022-08-05 DIAGNOSIS — R600 Localized edema: Secondary | ICD-10-CM | POA: Diagnosis not present

## 2022-08-05 DIAGNOSIS — N185 Chronic kidney disease, stage 5: Secondary | ICD-10-CM | POA: Diagnosis not present

## 2022-08-05 DIAGNOSIS — N2581 Secondary hyperparathyroidism of renal origin: Secondary | ICD-10-CM | POA: Diagnosis not present

## 2022-08-17 ENCOUNTER — Telehealth: Payer: Self-pay | Admitting: *Deleted

## 2022-08-17 DIAGNOSIS — Z87891 Personal history of nicotine dependence: Secondary | ICD-10-CM | POA: Diagnosis not present

## 2022-08-17 DIAGNOSIS — Z7982 Long term (current) use of aspirin: Secondary | ICD-10-CM | POA: Diagnosis not present

## 2022-08-17 DIAGNOSIS — Z888 Allergy status to other drugs, medicaments and biological substances status: Secondary | ICD-10-CM | POA: Diagnosis not present

## 2022-08-17 DIAGNOSIS — Z885 Allergy status to narcotic agent status: Secondary | ICD-10-CM | POA: Diagnosis not present

## 2022-08-17 DIAGNOSIS — Z992 Dependence on renal dialysis: Secondary | ICD-10-CM | POA: Diagnosis not present

## 2022-08-17 DIAGNOSIS — Z9861 Coronary angioplasty status: Secondary | ICD-10-CM | POA: Diagnosis not present

## 2022-08-17 DIAGNOSIS — I2510 Atherosclerotic heart disease of native coronary artery without angina pectoris: Secondary | ICD-10-CM | POA: Diagnosis not present

## 2022-08-17 DIAGNOSIS — K219 Gastro-esophageal reflux disease without esophagitis: Secondary | ICD-10-CM | POA: Diagnosis not present

## 2022-08-17 DIAGNOSIS — I132 Hypertensive heart and chronic kidney disease with heart failure and with stage 5 chronic kidney disease, or end stage renal disease: Secondary | ICD-10-CM | POA: Diagnosis not present

## 2022-08-17 DIAGNOSIS — I509 Heart failure, unspecified: Secondary | ICD-10-CM | POA: Diagnosis not present

## 2022-08-17 DIAGNOSIS — Z79899 Other long term (current) drug therapy: Secondary | ICD-10-CM | POA: Diagnosis not present

## 2022-08-17 DIAGNOSIS — Z882 Allergy status to sulfonamides status: Secondary | ICD-10-CM | POA: Diagnosis not present

## 2022-08-17 DIAGNOSIS — Z9889 Other specified postprocedural states: Secondary | ICD-10-CM | POA: Diagnosis not present

## 2022-08-17 DIAGNOSIS — Z951 Presence of aortocoronary bypass graft: Secondary | ICD-10-CM | POA: Diagnosis not present

## 2022-08-17 DIAGNOSIS — K660 Peritoneal adhesions (postprocedural) (postinfection): Secondary | ICD-10-CM | POA: Diagnosis not present

## 2022-08-17 DIAGNOSIS — I25118 Atherosclerotic heart disease of native coronary artery with other forms of angina pectoris: Secondary | ICD-10-CM | POA: Diagnosis not present

## 2022-08-17 DIAGNOSIS — I120 Hypertensive chronic kidney disease with stage 5 chronic kidney disease or end stage renal disease: Secondary | ICD-10-CM | POA: Diagnosis not present

## 2022-08-17 DIAGNOSIS — Z880 Allergy status to penicillin: Secondary | ICD-10-CM | POA: Diagnosis not present

## 2022-08-17 DIAGNOSIS — G4733 Obstructive sleep apnea (adult) (pediatric): Secondary | ICD-10-CM | POA: Diagnosis not present

## 2022-08-17 DIAGNOSIS — Z7901 Long term (current) use of anticoagulants: Secondary | ICD-10-CM | POA: Diagnosis not present

## 2022-08-17 DIAGNOSIS — N186 End stage renal disease: Secondary | ICD-10-CM | POA: Diagnosis not present

## 2022-08-17 DIAGNOSIS — Z881 Allergy status to other antibiotic agents status: Secondary | ICD-10-CM | POA: Diagnosis not present

## 2022-08-17 NOTE — Telephone Encounter (Signed)
Spoke to pt wife-verified pt still taking atorvastatin 40 mg. Pt have new PCP-Dr. Army Melia that will take over this medication per pt wife.

## 2022-08-18 ENCOUNTER — Ambulatory Visit: Payer: Medicare Other | Admitting: Urology

## 2022-08-18 VITALS — BP 112/55 | HR 86 | Ht 69.0 in | Wt 225.0 lb

## 2022-08-18 DIAGNOSIS — R339 Retention of urine, unspecified: Secondary | ICD-10-CM

## 2022-08-18 DIAGNOSIS — N9989 Other postprocedural complications and disorders of genitourinary system: Secondary | ICD-10-CM

## 2022-08-18 DIAGNOSIS — K5909 Other constipation: Secondary | ICD-10-CM

## 2022-08-18 LAB — BLADDER SCAN AMB NON-IMAGING: Scan Result: 94

## 2022-08-18 NOTE — Progress Notes (Signed)
Ronald [name],acting as a scribe for Hollice Espy, MD.,have documented all relevant documentation on the behalf of Hollice Espy, MD,as directed by  Hollice Espy, MD while in the presence of Hollice Espy, MD.  08/18/2022 11:18 AM   Narda Amber Armstead Peaks Aug 16, 1945 [id number]  Referring provider: Glean Hess, MD [address] [place],  [place] [address]  Chief Complaint  Patient presents with   Urinary Retention    HPI: 77 year-old male with multiple medical comorbidities including end-stage renal disease (ESRD), presented with difficulty urinating following peritoneal dialysis (PD) catheter placement in [facility].    Since then, he has been having difficulty urinating. He did void once yesterday and then one time just after calling Dr. Candiss Norse this morning. Normally, he urinates three to four times daily without issues. He experienced burning upon urination post-surgery and has had stop-and-start urination patterns since.  He has been struggling with chronic constipation which is being aggressively treated by Dr. Candiss Norse, including with the addition of lactulose to his regimen. He has been doing enemas.   Results for orders placed or performed in visit on 08/18/22  Bladder Scan (Post Void Residual) in office  Result Value Ref Range   Scan Result 94 ml     PMH: Past Medical History:  Diagnosis Date   AAA (abdominal aortic aneurysm) ([place])    a. 3cm by [place] 2015.   Arthritis    "hips; back" (12/13/2014)   CAD (coronary artery disease) 2007   a. s/p CABG- IMA-LAD, VG-Cx, VG-RCA, VG-diag in 1999. B. sp redo CABG- VG-OM, VG-RCA in 2007 due to VG disease. c. NSTEMI 11/2014 s/p DES to SVG-OM from the Y graft.d. PTCA/DES x 1 distal body of SVG to Diagonal.09/2015   Chronic combined systolic and diastolic CHF (congestive heart failure) ([place])    a. remote EF 40-45% in 2006. b. Normal EF 2014. b. Echo 07/2016 EF 45-50%, grade 1 DD. c. Echo 2020 30% to 35%. Diffuse EF 30-35%,  diffuse hypokinesis.   Chronic lower back pain    CKD (chronic kidney disease), stage IV (HCC)    COPD (chronic obstructive pulmonary disease) (HCC)    Deafness in left ear    Degenerative disc disease, lumbar    Diabetes mellitus, type 2 ([place]) 10/04/2014   Microalbumin 05/11/2012-100. Foot exam/monofilament 05/11/2012-normal.   Dilated cardiomyopathy ([place]) 10/07/2015   Emphysema    Esophageal stricture 07/02/1998   EGD   Genital candidiasis in male 10/25/2012   GERD (gastroesophageal reflux disease)    History of gout    "last flareup was in 2007" (12/13/2014)   History of hiatal hernia    Hyperlipidemia    Hypertension    Ischemic cardiomyopathy 2006   Echo 2020: EF 30-35%, diffuse hypokinesis   NSTEMI (non-ST elevated myocardial infarction) ([place]) 12/13/2014   PVC's (premature ventricular contractions)    Renal artery stenosis ([place])    a. noted on CT 2008.   Type II diabetes mellitus (HCC)    Diet control    Unstable angina ([place]) 07/27/2017   Walking pneumonia 1990's   Wears dentures    full upper    Surgical History: Past Surgical History:  Procedure Laterality Date   CARDIAC CATHETERIZATION  "several"   CARDIAC CATHETERIZATION N/A 12/13/2014   Procedure: Left Heart Cath and Coronary Angiography;  Surgeon: Jettie Booze, MD;  Location: [place] [facility];  Service: Cardiovascular;  Laterality: N/A;   CARDIAC CATHETERIZATION  12/13/2014   Procedure: Coronary Stent Intervention;  Surgeon: Jettie Booze, MD;  Location: [place] [facility];  Service: Cardiovascular;;   CARDIAC CATHETERIZATION N/A 10/07/2015   Procedure: Left Heart Cath and Cors/Grafts Angiography;  Surgeon: Burnell Blanks, MD;  Location: [place] [facility];  Service: Cardiovascular;  Laterality: N/A;   CARDIAC CATHETERIZATION N/A 10/07/2015   Procedure: Coronary Stent Intervention;  Surgeon: Burnell Blanks, MD;  Location: [place] [facility];  Service: Cardiovascular;  Laterality: N/A;    CARDIOVERSION N/A 05/25/2022   Procedure: CARDIOVERSION;  Surgeon: Berniece Salines, DO;  Location: [place];  Service: Cardiovascular;  Laterality: N/A;   CATARACT EXTRACTION W/PHACO Left 10/21/2021   Procedure: CATARACT EXTRACTION PHACO AND INTRAOCULAR LENS PLACEMENT (IOC) LEFT 3.94 00:33.7;  Surgeon: Eulogio Bear, MD;  Location: [place];  Service: Ophthalmology;  Laterality: Left;   CATARACT EXTRACTION W/PHACO Right 11/09/2021   Procedure: CATARACT EXTRACTION PHACO AND INTRAOCULAR LENS PLACEMENT (IOC) RIGHT;  Surgeon: Eulogio Bear, MD;  Location: [place];  Service: Ophthalmology;  Laterality: Right;  3.59 0:29.4   CORONARY ANGIOPLASTY  "several"   CORONARY ANGIOPLASTY WITH STENT PLACEMENT  2005; 12/13/2014   "2; 1"   CORONARY ARTERY BYPASS GRAFT  1996   CABG X5   CORONARY ARTERY BYPASS GRAFT  March 2007   CABG X3   ESOPHAGOGASTRODUODENOSCOPY (EGD) WITH ESOPHAGEAL DILATION  2000   GREEN LIGHT LASER TURP (TRANSURETHRAL RESECTION OF PROSTATE  2000's   "not cancerous"   HERNIA REPAIR     LAPAROSCOPIC CHOLECYSTECTOMY     LEFT HEART CATH AND CORS/GRAFTS ANGIOGRAPHY N/A 07/28/2017   Procedure: LEFT HEART CATH AND CORS/GRAFTS ANGIOGRAPHY;  Surgeon: Troy Sine, MD;  Location: [place] [facility];  Service: Cardiovascular;  Laterality: N/A;   LUNG SURGERY  1996   "S/P CABG, had to put staple in lung after it had collapsed"   UMBILICAL HERNIA REPAIR     w/chole    Home Medications:  Allergies as of 08/18/2022       Reactions   Predicort [prednisolone] Other (See Comments)   Stomach pain   Ciprofloxacin Other (See Comments)   GI upset   Hydrochlorothiazide Other (See Comments)   Dehydration   Hydrocodone Nausea Only, Other (See Comments)   Stomach upset   Hydrocodone-acetaminophen Nausea Only   Stomach upset   Sulfa Antibiotics Other (See Comments)   Cannot recall   Penicillins Hives, Rash, Other (See Comments)   Has patient had a PCN reaction  causing immediate rash, facial/tongue/throat swelling, SOB or lightheadedness with hypotension: YES Has patient had a PCN reaction causing severe rash involving mucus membranes or skin necrosis: NO Has patient had a PCN reaction that required hospitalization NO Has patient had a PCN reaction occurring within the last 10 years:NO If all of the above answers are "NO", then may proceed with Cephalosporin use. Has patient had a PCN reaction causing immediate rash, facial/tongue/throat swelling, SOB or lightheadedness with hypotension: YES Has patient had a PCN reaction causing severe rash involving mucus membranes or skin necrosis: NO Has patient had a PCN reaction that required hospitalization NO Has patient had a PCN reaction occurring within the last 10 years:NO If all of the above answers are "NO", then may proceed with Cephalosporin use.        Medication List        Accurate as of August 18, 2022 11:18 AM. If you have any questions, ask your nurse or doctor.          acetaminophen  650 MG CR tablet Commonly known as: TYLENOL Take 1,300 mg by mouth at bedtime.   allopurinol 100 MG tablet Commonly known as: ZYLOPRIM TAKE 1 TABLET BY MOUTH EVERYDAY AT BEDTIME   amiodarone 200 MG tablet Commonly known as: PACERONE Take 200 mg by mouth daily.   apixaban 5 MG Tabs tablet Commonly known as: ELIQUIS Take 1 tablet (5 mg total) by mouth 2 (two) times daily.   atorvastatin 40 MG tablet Commonly known as: LIPITOR Take 1 tablet (40 mg total) by mouth at bedtime. Home med.   furosemide 40 MG tablet Commonly known as: LASIX Take 1.5 tablets (60 mg total) by mouth daily. Increased from 40 mg.   isosorbide mononitrate 60 MG 24 hr tablet Commonly known as: IMDUR Take 1 tablet (60 mg total) by mouth daily. Increased from 30 mg.   levothyroxine 75 MCG tablet Commonly known as: SYNTHROID Take 1 tablet (75 mcg total) by mouth daily before breakfast.   loratadine 10 MG tablet Commonly  known as: CLARITIN Take 10 mg by mouth daily.   metoprolol tartrate 100 MG tablet Commonly known as: LOPRESSOR Take 1.5 tablets (150 mg total) by mouth 2 (two) times daily. Increased from 100 mg twice daily.   MULTIVITAMIN PO Take 1 tablet by mouth daily.   nitroGLYCERIN 0.4 MG SL tablet Commonly known as: NITROSTAT Place 0.4 mg under the tongue every 5 (five) minutes as needed for chest pain (Up to 3 times).   pantoprazole 40 MG tablet Commonly known as: PROTONIX Take 1 tablet (40 mg total) by mouth 2 (two) times daily. Home med.   polyethylene glycol 17 g packet Commonly known as: MIRALAX / GLYCOLAX Take 17 g by mouth at bedtime.   sodium bicarbonate 650 MG tablet Take 650 mg by mouth 2 (two) times daily.   Vitamin D-3 125 MCG (5000 UT) Tabs Take 2,000 Units by mouth daily.        Allergies:  Allergies  Allergen Reactions   Predicort [Prednisolone] Other (See Comments)    Stomach pain   Ciprofloxacin Other (See Comments)    GI upset   Hydrochlorothiazide Other (See Comments)    Dehydration   Hydrocodone Nausea Only and Other (See Comments)    Stomach upset    Hydrocodone-Acetaminophen Nausea Only    Stomach upset   Sulfa Antibiotics Other (See Comments)    Cannot recall   Penicillins Hives, Rash and Other (See Comments)    Has patient had a PCN reaction causing immediate rash, facial/tongue/throat swelling, SOB or lightheadedness with hypotension: YES  Has patient had a PCN reaction causing severe rash involving mucus membranes or skin necrosis: NO  Has patient had a PCN reaction that required hospitalization NO  Has patient had a PCN reaction occurring within the last 10 years:NO  If all of the above answers are "NO", then may proceed with Cephalosporin use.  Has patient had a PCN reaction causing immediate rash, facial/tongue/throat swelling, SOB or lightheadedness with hypotension: YES Has patient had a PCN reaction causing severe rash involving mucus  membranes or skin necrosis: NO Has patient had a PCN reaction that required hospitalization NO Has patient had a PCN reaction occurring within the last 10 years:NO If all of the above answers are "NO", then may proceed with Cephalosporin use.    Family History: Family History  Problem Relation Age of Onset   Heart attack Mother        MI   Stroke Mother    Heart disease Mother  Hypertension Mother    Hyperlipidemia Mother    Asthma Mother    Heart disease Father    Rheumatic fever Father    Colon cancer Neg Hx     Social History:  reports that he quit smoking about 36 years ago. His smoking use included cigarettes. He has a 60.00 pack-year smoking history. He has never used smokeless tobacco. He reports that he does not drink alcohol and does not use drugs.   Physical Exam: BP (!) 112/55   Pulse 86   Ht 5\' 9"  (1.753 m)   Wt 225 lb (102.1 kg)   BMI 33.23 kg/m   Constitutional:  Alert and oriented, No acute distress. HEENT: [place] AT, moist mucus membranes.  Trachea midline, no masses. Neurologic: Grossly intact, no focal deficits, moving all 4 extremities. Psychiatric: Normal mood and affect.  Assessment & Plan:    1. Postoperative urinary retention - Likely multifactorial, including effects of anesthesia, possible enlarged prostate, and exacerbated by chronic constipation. Given the patient's improvement in voiding frequency and the low post-void residual (PVR) of 94, no further intervention is recommended at this time. The patient is advised to return if issues persist later today. - He is emptying his bladder reasonably well at this time. We are not recommending further intervention at this time. If he is having issues today he may return. Otherwise, return as needed.   2. Chronic constipation  - Aggressively treated with lactulose, considering the addition of MiraLax and stool softeners to the regimen.   Return if symptoms worsen or fail to improve.  [facility] [address], [place], [place] [address] [phone]

## 2022-08-18 NOTE — Progress Notes (Signed)
Haze Rushing Plume,acting as a scribe for Hollice Espy, MD.,have documented all relevant documentation on the behalf of Hollice Espy, MD,as directed by  Hollice Espy, MD while in the presence of Hollice Espy, MD.  08/18/2022 12:45 PM   Ronald [name] Ronald [name]  Referring provider: Glean Hess, MD [address] [place],  [place] [address]  Chief Complaint  Patient presents with   Urinary Retention    HPI: 78 year-old male with multiple medical comorbidities including end-stage renal disease (ESRD), presented with difficulty urinating following peritoneal dialysis (PD) catheter placement in [facility] yesterday.  Contacted by Dr. Candiss Norse to evaluate this patient emergently due to concern for urinary retention.  Since then, he has been having difficulty urinating. He did void once yesterday and then one time just after calling Dr. Candiss Norse this morning. Normally, he urinates three to four times daily without issues. He experienced burning upon urination post-surgery and has had stop-and-start urination patterns since.  Likely, he was able to urinate just after calling Dr. Keturah Barre office this morning and feels more comfortable.  He has been struggling with chronic constipation which is being aggressively treated by Dr. Candiss Norse, including with the addition of lactulose to his regimen. He has been doing enemas.   Results for orders placed or performed in visit on 08/18/22  Bladder Scan (Post Void Residual) in office  Result Value Ref Range   Scan Result 94 ml     PMH: Past Medical History:  Diagnosis Date   AAA (abdominal aortic aneurysm) ([place])    a. 3cm by [place] 2015.   Arthritis    "hips; back" (12/13/2014)   CAD (coronary artery disease) 2007   a. s/p CABG- IMA-LAD, VG-Cx, VG-RCA, VG-diag in 1999. B. sp redo CABG- VG-OM, VG-RCA in 2007 due to VG disease. c. NSTEMI 11/2014 s/p DES to SVG-OM from the Y graft.d. PTCA/DES x 1 distal body of SVG to  Diagonal.09/2015   Chronic combined systolic and diastolic CHF (congestive heart failure) ([place])    a. remote EF 40-45% in 2006. b. Normal EF 2014. b. Echo 07/2016 EF 45-50%, grade 1 DD. c. Echo 2020 30% to 35%. Diffuse EF 30-35%, diffuse hypokinesis.   Chronic lower back pain    CKD (chronic kidney disease), stage IV (HCC)    COPD (chronic obstructive pulmonary disease) (HCC)    Deafness in left ear    Degenerative disc disease, lumbar    Diabetes mellitus, type 2 ([place]) 10/04/2014   Microalbumin 05/11/2012-100. Foot exam/monofilament 05/11/2012-normal.   Dilated cardiomyopathy ([place]) 10/07/2015   Emphysema    Esophageal stricture 07/02/1998   EGD   Genital candidiasis in male 10/25/2012   GERD (gastroesophageal reflux disease)    History of gout    "last flareup was in 2007" (12/13/2014)   History of hiatal hernia    Hyperlipidemia    Hypertension    Ischemic cardiomyopathy 2006   Echo 2020: EF 30-35%, diffuse hypokinesis   NSTEMI (non-ST elevated myocardial infarction) ([place]) 12/13/2014   PVC's (premature ventricular contractions)    Renal artery stenosis ([place])    a. noted on CT 2008.   Type II diabetes mellitus (HCC)    Diet control    Unstable angina ([place]) 07/27/2017   Walking pneumonia 1990's   Wears dentures    full upper    Surgical History: Past Surgical History:  Procedure Laterality Date   CARDIAC CATHETERIZATION  "several"   CARDIAC CATHETERIZATION N/A 12/13/2014   Procedure: Left Heart Cath  and Coronary Angiography;  Surgeon: Jettie Booze, MD;  Location: [facility];  Service: Cardiovascular;  Laterality: N/A;   CARDIAC CATHETERIZATION  12/13/2014   Procedure: Coronary Stent Intervention;  Surgeon: Jettie Booze, MD;  Location: [facility];  Service: Cardiovascular;;   CARDIAC CATHETERIZATION N/A 10/07/2015   Procedure: Left Heart Cath and Cors/Grafts Angiography;  Surgeon: Burnell Blanks, MD;  Location: [facility];  Service:  Cardiovascular;  Laterality: N/A;   CARDIAC CATHETERIZATION N/A 10/07/2015   Procedure: Coronary Stent Intervention;  Surgeon: Burnell Blanks, MD;  Location: [facility];  Service: Cardiovascular;  Laterality: N/A;   CARDIOVERSION N/A 05/25/2022   Procedure: CARDIOVERSION;  Surgeon: Berniece Salines, DO;  Location: [facility];  Service: Cardiovascular;  Laterality: N/A;   CATARACT EXTRACTION W/PHACO Left 10/21/2021   Procedure: CATARACT EXTRACTION PHACO AND INTRAOCULAR LENS PLACEMENT (IOC) LEFT 3.94 00:33.7;  Surgeon: Eulogio Bear, MD;  Location: [facility];  Service: Ophthalmology;  Laterality: Left;   CATARACT EXTRACTION W/PHACO Right 11/09/2021   Procedure: CATARACT EXTRACTION PHACO AND INTRAOCULAR LENS PLACEMENT (IOC) RIGHT;  Surgeon: Eulogio Bear, MD;  Location: [place] [facility];  Service: Ophthalmology;  Laterality: Right;  3.59 0:29.4   CORONARY ANGIOPLASTY  "several"   CORONARY ANGIOPLASTY WITH STENT PLACEMENT  2005; 12/13/2014   "2; 1"   CORONARY ARTERY BYPASS GRAFT  1996   CABG X5   CORONARY ARTERY BYPASS GRAFT  March 2007   CABG X3   ESOPHAGOGASTRODUODENOSCOPY (EGD) WITH ESOPHAGEAL DILATION  2000   GREEN LIGHT LASER TURP (TRANSURETHRAL RESECTION OF PROSTATE  2000's   "not cancerous"   HERNIA REPAIR     LAPAROSCOPIC CHOLECYSTECTOMY     LEFT HEART CATH AND CORS/GRAFTS ANGIOGRAPHY N/A 07/28/2017   Procedure: LEFT HEART CATH AND CORS/GRAFTS ANGIOGRAPHY;  Surgeon: Troy Sine, MD;  Location: [facility];  Service: Cardiovascular;  Laterality: N/A;   LUNG SURGERY  1996   "S/P CABG, had to put staple in lung after it had collapsed"   UMBILICAL HERNIA REPAIR     w/chole    Home Medications:  Allergies as of 08/18/2022       Reactions   Predicort [prednisolone] Other (See Comments)   Stomach pain   Ciprofloxacin Other (See Comments)   GI upset   Hydrochlorothiazide Other (See Comments)   Dehydration   Hydrocodone Nausea Only, Other  (See Comments)   Stomach upset   Hydrocodone-acetaminophen Nausea Only   Stomach upset   Sulfa Antibiotics Other (See Comments)   Cannot recall   Penicillins Hives, Rash, Other (See Comments)   Has patient had a PCN reaction causing immediate rash, facial/tongue/throat swelling, SOB or lightheadedness with hypotension: YES Has patient had a PCN reaction causing severe rash involving mucus membranes or skin necrosis: NO Has patient had a PCN reaction that required hospitalization NO Has patient had a PCN reaction occurring within the last 10 years:NO If all of the above answers are "NO", then may proceed with Cephalosporin use. Has patient had a PCN reaction causing immediate rash, facial/tongue/throat swelling, SOB or lightheadedness with hypotension: YES Has patient had a PCN reaction causing severe rash involving mucus membranes or skin necrosis: NO Has patient had a PCN reaction that required hospitalization NO Has patient had a PCN reaction occurring within the last 10 years:NO If all of the above answers are "NO", then may proceed with Cephalosporin use.        Medication List  Accurate as of August 18, 2022 12:45 PM. If you have any questions, ask your nurse or doctor.          acetaminophen 650 MG CR tablet Commonly known as: TYLENOL Take 1,300 mg by mouth at bedtime.   allopurinol 100 MG tablet Commonly known as: ZYLOPRIM TAKE 1 TABLET BY MOUTH EVERYDAY AT BEDTIME   amiodarone 200 MG tablet Commonly known as: PACERONE Take 200 mg by mouth daily.   apixaban 5 MG Tabs tablet Commonly known as: ELIQUIS Take 1 tablet (5 mg total) by mouth 2 (two) times daily.   atorvastatin 40 MG tablet Commonly known as: LIPITOR Take 1 tablet (40 mg total) by mouth at bedtime. Home med.   furosemide 40 MG tablet Commonly known as: LASIX Take 1.5 tablets (60 mg total) by mouth daily. Increased from 40 mg.   isosorbide mononitrate 60 MG 24 hr tablet Commonly known as:  IMDUR Take 1 tablet (60 mg total) by mouth daily. Increased from 30 mg.   levothyroxine 75 MCG tablet Commonly known as: SYNTHROID Take 1 tablet (75 mcg total) by mouth daily before breakfast.   loratadine 10 MG tablet Commonly known as: CLARITIN Take 10 mg by mouth daily.   metoprolol tartrate 100 MG tablet Commonly known as: LOPRESSOR Take 1.5 tablets (150 mg total) by mouth 2 (two) times daily. Increased from 100 mg twice daily.   MULTIVITAMIN PO Take 1 tablet by mouth daily.   nitroGLYCERIN 0.4 MG SL tablet Commonly known as: NITROSTAT Place 0.4 mg under the tongue every 5 (five) minutes as needed for chest pain (Up to 3 times).   pantoprazole 40 MG tablet Commonly known as: PROTONIX Take 1 tablet (40 mg total) by mouth 2 (two) times daily. Home med.   polyethylene glycol 17 g packet Commonly known as: MIRALAX / GLYCOLAX Take 17 g by mouth at bedtime.   sodium bicarbonate 650 MG tablet Take 650 mg by mouth 2 (two) times daily.   Vitamin D-3 125 MCG (5000 UT) Tabs Take 2,000 Units by mouth daily.        Allergies:  Allergies  Allergen Reactions   Predicort [Prednisolone] Other (See Comments)    Stomach pain   Ciprofloxacin Other (See Comments)    GI upset   Hydrochlorothiazide Other (See Comments)    Dehydration   Hydrocodone Nausea Only and Other (See Comments)    Stomach upset    Hydrocodone-Acetaminophen Nausea Only    Stomach upset   Sulfa Antibiotics Other (See Comments)    Cannot recall   Penicillins Hives, Rash and Other (See Comments)    Has patient had a PCN reaction causing immediate rash, facial/tongue/throat swelling, SOB or lightheadedness with hypotension: YES  Has patient had a PCN reaction causing severe rash involving mucus membranes or skin necrosis: NO  Has patient had a PCN reaction that required hospitalization NO  Has patient had a PCN reaction occurring within the last 10 years:NO  If all of the above answers are "NO", then  may proceed with Cephalosporin use.  Has patient had a PCN reaction causing immediate rash, facial/tongue/throat swelling, SOB or lightheadedness with hypotension: YES Has patient had a PCN reaction causing severe rash involving mucus membranes or skin necrosis: NO Has patient had a PCN reaction that required hospitalization NO Has patient had a PCN reaction occurring within the last 10 years:NO If all of the above answers are "NO", then may proceed with Cephalosporin use.    Family History: Family History  Problem Relation Age of Onset   Heart attack Mother        MI   Stroke Mother    Heart disease Mother    Hypertension Mother    Hyperlipidemia Mother    Asthma Mother    Heart disease Father    Rheumatic fever Father    Colon cancer Neg Hx     Social History:  reports that he quit smoking about 36 years ago. His smoking use included cigarettes. He has a 60.00 pack-year smoking history. He has never used smokeless tobacco. He reports that he does not drink alcohol and does not use drugs.   Physical Exam: BP (!) 112/55   Pulse 86   Ht 5\' 9"  (1.753 m)   Wt 225 lb (102.1 kg)   BMI 33.23 kg/m   Constitutional:  Alert and oriented, No acute distress. HEENT: [place] AT, moist mucus membranes.  Trachea midline, no masses. Neurologic: Grossly intact, no focal deficits, moving all 4 extremities. Psychiatric: Normal mood and affect.  Assessment & Plan:    1. Postoperative urinary retention - Likely multifactorial, including effects of anesthesia, possible enlarged prostate, and exacerbated by chronic constipation. Given the patient's improvement in voiding frequency and the low post-void residual (PVR) of 94, no further intervention is recommended at this time. The patient is advised to return if issues persist later today. - He is emptying his bladder reasonably well at this time. We are not recommending further intervention at this time. If he is having issues today he may return.  Otherwise, return as needed.   2. Chronic constipation  - Aggressively treated with lactulose, considering the addition of MiraLax and stool softeners to the regimen.   Return if symptoms worsen or fail to improve.  I have reviewed the above documentation for accuracy and completeness, and I agree with the above.   Hollice Espy, MD  [facility] [address] [place], [place] [address] [phone]

## 2022-08-20 ENCOUNTER — Encounter: Payer: Self-pay | Admitting: Internal Medicine

## 2022-08-22 ENCOUNTER — Other Ambulatory Visit: Payer: Self-pay | Admitting: Internal Medicine

## 2022-08-22 DIAGNOSIS — I2510 Atherosclerotic heart disease of native coronary artery without angina pectoris: Secondary | ICD-10-CM

## 2022-08-22 MED ORDER — ATORVASTATIN CALCIUM 40 MG PO TABS: 40.0000 mg | ORAL_TABLET | Freq: Every day | ORAL | 1 refills | Status: DC

## 2022-08-26 DIAGNOSIS — N2581 Secondary hyperparathyroidism of renal origin: Secondary | ICD-10-CM | POA: Diagnosis not present

## 2022-08-26 DIAGNOSIS — N185 Chronic kidney disease, stage 5: Secondary | ICD-10-CM | POA: Diagnosis not present

## 2022-08-26 DIAGNOSIS — D631 Anemia in chronic kidney disease: Secondary | ICD-10-CM | POA: Diagnosis not present

## 2022-08-26 DIAGNOSIS — I129 Hypertensive chronic kidney disease with stage 1 through stage 4 chronic kidney disease, or unspecified chronic kidney disease: Secondary | ICD-10-CM | POA: Diagnosis not present

## 2022-08-26 DIAGNOSIS — R600 Localized edema: Secondary | ICD-10-CM | POA: Diagnosis not present

## 2022-08-26 DIAGNOSIS — I10 Essential (primary) hypertension: Secondary | ICD-10-CM | POA: Diagnosis not present

## 2022-08-31 DIAGNOSIS — I120 Hypertensive chronic kidney disease with stage 5 chronic kidney disease or end stage renal disease: Secondary | ICD-10-CM | POA: Diagnosis not present

## 2022-09-06 DIAGNOSIS — N186 End stage renal disease: Secondary | ICD-10-CM | POA: Diagnosis not present

## 2022-09-06 DIAGNOSIS — E1122 Type 2 diabetes mellitus with diabetic chronic kidney disease: Secondary | ICD-10-CM | POA: Diagnosis not present

## 2022-09-06 DIAGNOSIS — I259 Chronic ischemic heart disease, unspecified: Secondary | ICD-10-CM | POA: Diagnosis not present

## 2022-09-06 DIAGNOSIS — Z992 Dependence on renal dialysis: Secondary | ICD-10-CM | POA: Diagnosis not present

## 2022-09-06 LAB — IRON,TIBC AND FERRITIN PANEL
Ferritin: 91
Iron: 6

## 2022-09-06 LAB — VITAMIN D 25 HYDROXY (VIT D DEFICIENCY, FRACTURES): Vit D, 25-Hydroxy: 83

## 2022-09-06 LAB — CBC AND DIFFERENTIAL
Hemoglobin: 9.3 — AB (ref 13.5–17.5)
WBC: 6.6

## 2022-09-06 LAB — HEMOGLOBIN A1C: Hemoglobin A1C: 5.8

## 2022-09-06 LAB — BASIC METABOLIC PANEL
BUN: 85 — AB (ref 4–21)
CO2: 27 — AB (ref 13–22)
Creatinine: 4.6 — AB (ref 0.6–1.3)
Glucose: 250
Potassium: 3.7 mEq/L (ref 3.5–5.1)
Sodium: 143 (ref 137–147)

## 2022-09-06 LAB — COMPREHENSIVE METABOLIC PANEL
Albumin: 3.5 (ref 3.5–5.0)
Calcium: 9.5 (ref 8.7–10.7)

## 2022-09-07 DIAGNOSIS — Z992 Dependence on renal dialysis: Secondary | ICD-10-CM | POA: Diagnosis not present

## 2022-09-07 DIAGNOSIS — N186 End stage renal disease: Secondary | ICD-10-CM | POA: Diagnosis not present

## 2022-09-08 ENCOUNTER — Other Ambulatory Visit: Payer: Self-pay

## 2022-09-08 ENCOUNTER — Encounter: Payer: Self-pay | Admitting: Internal Medicine

## 2022-09-08 DIAGNOSIS — I2510 Atherosclerotic heart disease of native coronary artery without angina pectoris: Secondary | ICD-10-CM

## 2022-09-08 MED ORDER — ATORVASTATIN CALCIUM 40 MG PO TABS: 40.0000 mg | ORAL_TABLET | Freq: Every day | ORAL | 1 refills | Status: DC

## 2022-09-10 ENCOUNTER — Encounter
Admission: RE | Disposition: A | Discharge status: Home / Self Care | Payer: Self-pay | Source: Home / Self Care | Attending: Vascular Surgery

## 2022-09-10 ENCOUNTER — Other Ambulatory Visit: Payer: Self-pay

## 2022-09-10 ENCOUNTER — Encounter: Payer: Self-pay | Admitting: Vascular Surgery

## 2022-09-10 ENCOUNTER — Ambulatory Visit
Admission: RE | Admit: 2022-09-10 | Discharge: 2022-09-10 | Disposition: A | Discharge status: Home / Self Care | Payer: Medicare Other | Attending: Vascular Surgery | Admitting: Vascular Surgery

## 2022-09-10 DIAGNOSIS — Z87891 Personal history of nicotine dependence: Secondary | ICD-10-CM | POA: Diagnosis not present

## 2022-09-10 DIAGNOSIS — I480 Paroxysmal atrial fibrillation: Secondary | ICD-10-CM | POA: Diagnosis not present

## 2022-09-10 DIAGNOSIS — I132 Hypertensive heart and chronic kidney disease with heart failure and with stage 5 chronic kidney disease, or end stage renal disease: Secondary | ICD-10-CM | POA: Diagnosis not present

## 2022-09-10 DIAGNOSIS — I2510 Atherosclerotic heart disease of native coronary artery without angina pectoris: Secondary | ICD-10-CM | POA: Insufficient documentation

## 2022-09-10 DIAGNOSIS — Z7901 Long term (current) use of anticoagulants: Secondary | ICD-10-CM

## 2022-09-10 DIAGNOSIS — I878 Other specified disorders of veins: Secondary | ICD-10-CM

## 2022-09-10 DIAGNOSIS — I5042 Chronic combined systolic (congestive) and diastolic (congestive) heart failure: Secondary | ICD-10-CM | POA: Diagnosis not present

## 2022-09-10 DIAGNOSIS — N186 End stage renal disease: Secondary | ICD-10-CM

## 2022-09-10 DIAGNOSIS — I120 Hypertensive chronic kidney disease with stage 5 chronic kidney disease or end stage renal disease: Secondary | ICD-10-CM | POA: Diagnosis not present

## 2022-09-10 DIAGNOSIS — Z992 Dependence on renal dialysis: Secondary | ICD-10-CM | POA: Diagnosis not present

## 2022-09-10 DIAGNOSIS — E1122 Type 2 diabetes mellitus with diabetic chronic kidney disease: Secondary | ICD-10-CM | POA: Diagnosis not present

## 2022-09-10 HISTORY — PX: DIALYSIS/PERMA CATHETER INSERTION: CATH118288

## 2022-09-10 LAB — POTASSIUM (ARMC VASCULAR LAB ONLY): Potassium (ARMC vascular lab): 3.9 mmol/L (ref 3.5–5.1)

## 2022-09-10 LAB — GLUCOSE, CAPILLARY: Glucose-Capillary: 101 mg/dL — ABNORMAL HIGH (ref 70–99)

## 2022-09-10 SURGERY — DIALYSIS/PERMA CATHETER INSERTION
Anesthesia: Moderate Sedation

## 2022-09-10 MED ORDER — HYDROMORPHONE HCL 1 MG/ML IJ SOLN: 1.0000 mg | Freq: Once | INTRAMUSCULAR | Status: DC | PRN

## 2022-09-10 MED ORDER — MIDAZOLAM HCL 2 MG/ML PO SYRP: 8.0000 mg | ORAL_SOLUTION | Freq: Once | ORAL | Status: DC | PRN

## 2022-09-10 MED ORDER — VANCOMYCIN HCL IN DEXTROSE 1-5 GM/200ML-% IV SOLN
1000.0000 mg | INTRAVENOUS | Status: AC
  Administered 2022-09-10: 1000 mg via INTRAVENOUS

## 2022-09-10 MED ORDER — FENTANYL CITRATE (PF) 100 MCG/2ML IJ SOLN
INTRAMUSCULAR | Status: DC | PRN
  Administered 2022-09-10: 50 ug via INTRAVENOUS

## 2022-09-10 MED ORDER — FAMOTIDINE 20 MG PO TABS: 40.0000 mg | ORAL_TABLET | Freq: Once | ORAL | Status: DC | PRN

## 2022-09-10 MED ORDER — MIDAZOLAM HCL 2 MG/2ML IJ SOLN
INTRAMUSCULAR | Status: AC
  Filled 2022-09-10: qty 2

## 2022-09-10 MED ORDER — ONDANSETRON HCL 4 MG/2ML IJ SOLN: 4.0000 mg | Freq: Four times a day (QID) | INTRAMUSCULAR | Status: DC | PRN

## 2022-09-10 MED ORDER — IODIXANOL 320 MG/ML IV SOLN
INTRAVENOUS | Status: DC | PRN
  Administered 2022-09-10: 10 mL via INTRAVENOUS

## 2022-09-10 MED ORDER — MIDAZOLAM HCL 2 MG/2ML IJ SOLN
INTRAMUSCULAR | Status: DC | PRN
  Administered 2022-09-10: 2 mg via INTRAVENOUS

## 2022-09-10 MED ORDER — VANCOMYCIN HCL IN DEXTROSE 1-5 GM/200ML-% IV SOLN
INTRAVENOUS | Status: AC
  Filled 2022-09-10: qty 200

## 2022-09-10 MED ORDER — HEPARIN SODIUM (PORCINE) 10000 UNIT/ML IJ SOLN
INTRAMUSCULAR | Status: AC
  Filled 2022-09-10: qty 1

## 2022-09-10 MED ORDER — FENTANYL CITRATE PF 50 MCG/ML IJ SOSY
PREFILLED_SYRINGE | INTRAMUSCULAR | Status: AC
  Filled 2022-09-10: qty 1

## 2022-09-10 MED ORDER — SODIUM CHLORIDE 0.9 % IV SOLN: INTRAVENOUS | Status: DC

## 2022-09-10 MED ORDER — DIPHENHYDRAMINE HCL 50 MG/ML IJ SOLN: 50.0000 mg | Freq: Once | INTRAMUSCULAR | Status: DC | PRN

## 2022-09-10 MED ORDER — METHYLPREDNISOLONE SODIUM SUCC 125 MG IJ SOLR: 125.0000 mg | Freq: Once | INTRAMUSCULAR | Status: DC | PRN

## 2022-09-10 SURGICAL SUPPLY — 16 items
ADH SKN CLS APL DERMABOND .7 (GAUZE/BANDAGES/DRESSINGS) ×1
CATH BEACON 5 .035 40 KMP TP (CATHETERS) IMPLANT
CATH BEACON 5 .038 40 KMP TP (CATHETERS) ×1
CATH PALIN MAXID VT KIT 19CM (CATHETERS) IMPLANT
COVER PROBE ULTRASOUND 5X96 (MISCELLANEOUS) IMPLANT
DERMABOND ADVANCED .7 DNX12 (GAUZE/BANDAGES/DRESSINGS) IMPLANT
DRAPE INCISE IOBAN 66X45 STRL (DRAPES) IMPLANT
GLIDEWIRE ADV .035X180CM (WIRE) IMPLANT
GUIDEWIRE SUPER STIFF .035X180 (WIRE) IMPLANT
NDL ENTRY 21GA 7CM ECHOTIP (NEEDLE) IMPLANT
NEEDLE ENTRY 21GA 7CM ECHOTIP (NEEDLE) ×1 IMPLANT
PACK ANGIOGRAPHY (CUSTOM PROCEDURE TRAY) IMPLANT
SET INTRO CAPELLA COAXIAL (SET/KITS/TRAYS/PACK) IMPLANT
SHEATH BRITE TIP 5FRX11 (SHEATH) IMPLANT
SUT MNCRL AB 4-0 PS2 18 (SUTURE) IMPLANT
SUT SILK 0 FSL (SUTURE) IMPLANT

## 2022-09-10 NOTE — Progress Notes (Signed)
MRN : [id number]  Ronald [name] is a 77 y.o. (1946-04-25) male who presents with chief complaint of check access.  History of Present Illness:   The patient is seen for evaluation for dialysis access. 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  He is therefore undergoing placement of a tunneled catheter as an outpatient.    The patient notes the kidney problem has been present for a long time and has been progressively getting worse.  The patient is followed by nephrology.    The patient has been considering the various methods of dialysis and wishes to proceed with hemodialysis and therefore creation of AV access.  No recent shortening of the patient's walking distance or new symptoms consistent with claudication.  No history of rest pain symptoms. No new ulcers or wounds of the lower extremities have occurred.  The patient denies amaurosis fugax or recent TIA symptoms. There are no recent neurological changes noted. There is no history of DVT, PE or superficial thrombophlebitis. No recent episodes of angina or shortness of breath documented.   Current Meds  Medication Sig   allopurinol (ZYLOPRIM) 100 MG tablet TAKE 1 TABLET BY MOUTH EVERYDAY AT BEDTIME   amiodarone (PACERONE) 200 MG tablet Take 200 mg by mouth daily.   apixaban (ELIQUIS) 5 MG TABS tablet Take 1 tablet (5 mg total) by mouth 2 (two) times daily.   atorvastatin (LIPITOR) 40 MG tablet Take 1 tablet (40 mg total) by mouth at bedtime.   Cholecalciferol (VITAMIN D-3) 125 MCG (5000 UT) TABS Take 2,000 Units by mouth daily.    diphenhydrAMINE (BENADRYL) 25 mg capsule Take 25 mg by mouth every 6 (six) hours as needed.   furosemide (LASIX) 40 MG tablet Take 1.5 tablets (60 mg total) by mouth daily. Increased from 40 mg.   ibuprofen (ADVIL) 400 MG tablet Take 400 mg by mouth every 6 (six) hours as needed.    isosorbide mononitrate (IMDUR) 60 MG 24 hr tablet Take 1 tablet (60 mg total) by mouth daily. Increased from 30 mg.   levothyroxine (SYNTHROID) 75 MCG tablet Take 1 tablet (75 mcg total) by mouth daily before breakfast.   metoprolol tartrate (LOPRESSOR) 100 MG tablet Take 1.5 tablets (150 mg total) by mouth 2 (two) times daily. Increased from 100 mg twice daily.   Multiple Vitamin (MULTIVITAMIN PO) Take 1 tablet by mouth daily.   pantoprazole (PROTONIX) 40 MG tablet Take 1 tablet (40 mg total) by mouth 2 (two) times daily. Home med.   senna (SENOKOT) 8.6 MG tablet Take 1 tablet by mouth daily.   sodium bicarbonate 650 MG tablet Take 650 mg by mouth 2 (two) times daily.    Past Medical History:  Diagnosis Date   AAA (abdominal aortic aneurysm)    a. 3cm by [place] 2015.   Arthritis    "hips; back" (12/13/2014)   CAD (coronary artery disease) 2007   a. s/p CABG- IMA-LAD, VG-Cx, VG-RCA, VG-diag in 1999. B. sp redo CABG- VG-OM, VG-RCA in 2007 due to VG disease. c. NSTEMI 11/2014 s/p DES to SVG-OM from the Y graft.d. PTCA/DES x 1 distal body of SVG to Diagonal.09/2015   Chronic combined systolic and diastolic CHF (congestive heart failure)    a. remote EF 40-45% in 2006. b. Normal EF 2014. b.  Echo 07/2016 EF 45-50%, grade 1 DD. c. Echo 2020 30% to 35%. Diffuse EF 30-35%, diffuse hypokinesis.   Chronic lower back pain    CKD (chronic kidney disease), stage IV    COPD (chronic obstructive pulmonary disease)    Deafness in left ear    Degenerative disc disease, lumbar    Diabetes mellitus, type 2 10/04/2014   Microalbumin 05/11/2012-100. Foot exam/monofilament 05/11/2012-normal.   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 noted on CT 2008.   Type II diabetes mellitus    Diet control    Unstable angina 07/27/2017   Walking pneumonia 1990's   Wears dentures    full upper    Past Surgical History:  Procedure Laterality Date   CARDIAC CATHETERIZATION  "several"   CARDIAC CATHETERIZATION N/A 12/13/2014   Procedure: Left Heart Cath and Coronary Angiography;  Surgeon: Corky Crafts, MD;  Location: [facility] INVASIVE CV LAB;  Service: Cardiovascular;  Laterality: N/A;   CARDIAC CATHETERIZATION  12/13/2014   Procedure: Coronary Stent Intervention;  Surgeon: Corky Crafts, MD;  Location: [facility] INVASIVE CV LAB;  Service: Cardiovascular;;   CARDIAC CATHETERIZATION N/A 10/07/2015   Procedure: Left Heart Cath and Cors/Grafts Angiography;  Surgeon: Kathleene Hazel, MD;  Location: [facility] INVASIVE CV LAB;  Service: Cardiovascular;  Laterality: N/A;   CARDIAC CATHETERIZATION N/A 10/07/2015   Procedure: Coronary Stent Intervention;  Surgeon: Kathleene Hazel, MD;  Location: [facility] INVASIVE CV LAB;  Service: Cardiovascular;  Laterality: N/A;   CARDIOVERSION N/A 05/25/2022   Procedure: CARDIOVERSION;  Surgeon: Thomasene Ripple, DO;  Location: [facility] ENDOSCOPY;  Service: Cardiovascular;  Laterality: N/A;   CATARACT EXTRACTION W/PHACO Left 10/21/2021   Procedure: CATARACT EXTRACTION PHACO AND INTRAOCULAR LENS PLACEMENT (IOC) LEFT 3.94 00:33.7;  Surgeon: Nevada Crane, MD;  Location: [facility];  Service: Ophthalmology;  Laterality: Left;   CATARACT EXTRACTION W/PHACO Right 11/09/2021   Procedure: CATARACT EXTRACTION PHACO AND INTRAOCULAR LENS PLACEMENT (IOC) RIGHT;  Surgeon: Nevada Crane, MD;  Location: [facility];  Service: Ophthalmology;  Laterality: Right;  3.59 0:29.4   CORONARY ANGIOPLASTY  "several"   CORONARY  ANGIOPLASTY WITH STENT PLACEMENT  2005; 12/13/2014   "2; 1"   CORONARY ARTERY BYPASS GRAFT  1996   CABG X5   CORONARY ARTERY BYPASS GRAFT  March 2007   CABG X3   ESOPHAGOGASTRODUODENOSCOPY (EGD) WITH ESOPHAGEAL DILATION  2000   GREEN LIGHT LASER TURP (TRANSURETHRAL RESECTION OF PROSTATE  2000's   "not cancerous"   HERNIA REPAIR     LAPAROSCOPIC CHOLECYSTECTOMY     LEFT HEART CATH AND CORS/GRAFTS ANGIOGRAPHY N/A 07/28/2017   Procedure: LEFT HEART CATH AND CORS/GRAFTS ANGIOGRAPHY;  Surgeon: Lennette Bihari, MD;  Location: [facility] INVASIVE CV LAB;  Service: Cardiovascular;  Laterality: N/A;   LUNG SURGERY  1996   "S/P CABG, had to put staple in lung after it had collapsed"   UMBILICAL HERNIA REPAIR     w/chole    Social History Social History  Tobacco Use   Smoking status: Former    Packs/day: 3.00    Years: 20.00    Additional pack years: 0.00    Total pack years: 60.00    Types: Cigarettes    Quit date: 07/25/1986    Years since quitting: 36.1   Smokeless tobacco: Never  Vaping Use   Vaping Use: Never used  Substance Use Topics   Alcohol use: No    Alcohol/week: 0.0 standard drinks of alcohol   Drug use: No    Family History Family History  Problem Relation Age of Onset   Heart attack Mother        MI   Stroke Mother    Heart disease Mother    Hypertension Mother    Hyperlipidemia Mother    Asthma Mother    Heart disease Father    Rheumatic fever Father    Colon cancer Neg Hx     Allergies  Allergen Reactions   Predicort [Prednisolone] Other (See Comments)    Stomach pain   Ciprofloxacin Other (See Comments)    GI upset   Hydrochlorothiazide Other (See Comments)    Dehydration   Hydrocodone Nausea Only and Other (See Comments)    Stomach upset    Hydrocodone-Acetaminophen Nausea Only    Stomach upset   Sulfa Antibiotics Other (See Comments)    Cannot recall   Penicillins Hives, Rash and Other (See Comments)    Has patient had a PCN reaction causing  immediate rash, facial/tongue/throat swelling, SOB or lightheadedness with hypotension: YES  Has patient had a PCN reaction causing severe rash involving mucus membranes or skin necrosis: NO  Has patient had a PCN reaction that required hospitalization NO  Has patient had a PCN reaction occurring within the last 10 years:NO  If all of the above answers are "NO", then may proceed with Cephalosporin use.  Has patient had a PCN reaction causing immediate rash, facial/tongue/throat swelling, SOB or lightheadedness with hypotension: YES Has patient had a PCN reaction causing severe rash involving mucus membranes or skin necrosis: NO Has patient had a PCN reaction that required hospitalization NO Has patient had a PCN reaction occurring within the last 10 years:NO If all of the above answers are "NO", then may proceed with Cephalosporin use.     REVIEW OF SYSTEMS (Negative unless checked)  Constitutional: [] Weight loss  [] Fever  [] Chills Cardiac: [] Chest pain   [] Chest pressure   [] Palpitations   [] Shortness of breath when laying flat   [] Shortness of breath with exertion. Vascular:  [] Pain in legs with walking   [] Pain in legs at rest  [] History of DVT   [] Phlebitis   [] Swelling in legs   [] Varicose veins   [] Non-healing ulcers Pulmonary:   [] Uses home oxygen   [] Productive cough   [] Hemoptysis   [] Wheeze  [] COPD   [] Asthma Neurologic:  [] Dizziness   [] Seizures   [] History of stroke   [] History of TIA  [] Aphasia   [] Vissual changes   [] Weakness or numbness in arm   [] Weakness or numbness in leg Musculoskeletal:   [] Joint swelling   [] Joint pain   [] Low back pain Hematologic:  [] Easy bruising  [] Easy bleeding   [] Hypercoagulable state   [] Anemic Gastrointestinal:  [] Diarrhea   [] Vomiting  [] Gastroesophageal reflux/heartburn   [] Difficulty swallowing. Genitourinary:  [x] Chronic kidney disease   [] Difficult urination  [] Frequent urination   [] Blood in urine Skin:  [] Rashes   [] Ulcers   Psychological:  [] History of anxiety   []  History of  major depression.  Physical Examination  Vitals:   09/10/22 1335  BP: 137/80  Pulse: 91  Resp: 19  Temp: (!) 97.5 F (36.4 C)  TempSrc: Oral  SpO2: 95%  Weight: 105.2 kg  Height:  (1.753 m)   Body mass index is 34.26 kg/m. Gen: WD/WN, NAD Head: [place]/AT, No temporalis wasting.  Ear/Nose/Throat: Hearing grossly intact, nares w/o erythema or drainage Eyes: PER, EOMI, sclera nonicteric.  Neck: Supple, no gross masses or lesions.  No JVD.  Pulmonary:  Good air movement, no audible wheezing, no use of accessory muscles.  Cardiac: RRR, precordium non-hyperdynamic. Vascular:    Vessel Right Left  Radial Palpable Palpable  Brachial Palpable Palpable  Gastrointestinal: soft, non-distended. No guarding/no peritoneal signs.  Musculoskeletal: M/S 5/5 throughout.  No deformity.  Neurologic: CN 2-12 intact. Pain and light touch intact in extremities.  Symmetrical.  Speech is fluent. Motor exam as listed above. Psychiatric: Judgment intact, Mood & affect appropriate for pt's clinical situation. Dermatologic: No rashes or ulcers noted.  No changes consistent with cellulitis.   CBC Lab Results  Component Value Date   WBC 5.5 07/17/2022   HGB 9.6 (L) 07/17/2022   HCT 30.1 (L) 07/17/2022   MCV 86.2 07/17/2022   PLT 121 (L) 07/17/2022    BMET    Component Value Date/Time   NA 140 07/17/2022 0458   NA 143 05/19/2022 1548   K 3.8 07/17/2022 0458   CL 107 07/17/2022 0458   CO2 23 07/17/2022 0458   GLUCOSE 100 (H) 07/17/2022 0458   GLUCOSE 82 06/13/2006 1621   BUN 67 (H) 07/17/2022 0458   BUN 50 (H) 05/19/2022 1548   CREATININE 4.21 (H) 07/17/2022 0458   CREATININE 2.44 (H) 11/24/2015 1546   CALCIUM 9.4 07/17/2022 0458   GFRNONAA 14 (L) 07/17/2022 0458   GFRAA 28 (L) 03/20/2020 1038   CrCl cannot be calculated (Patient's most recent lab result is older than the maximum 21 days allowed.).  COAG Lab Results  Component  Value Date   INR 1.6 (H) 07/15/2022   INR 1.64 06/12/2018   INR 0.95 06/10/2018    Radiology No results found.   Assessment/Plan End-stage renal disease: Patient has reached the point that he is now initiating hemodialysis.  Therefore a tunneled catheter has been recommended by nephrology service as an outpatient.  The risks and benefits have been reviewed with the patient all questions have been answered patient agrees to proceed.  He will follow-up in the office for further planning for hemodialysis access.  Coronary artery disease: Continue cardiac and antihypertensive medications as already ordered and reviewed, no changes at this time.  Continue statin as ordered and reviewed, no changes at this time  Nitrates PRN for chest pain  Paroxysmal atrial fibrillation: Continue antiarrhythmia medications as already ordered, these medications have been reviewed and there are no changes at this time.  Continue anticoagulation as ordered by Cardiology Service  Essential hypertension: Continue antihypertensive medications as already ordered, these medications have been reviewed and there are no changes at this time.  [name], MD  09/10/2022 3:06 PM

## 2022-09-10 NOTE — Op Note (Signed)
OPERATIVE NOTE   PROCEDURE: Insertion of tunneled dialysis catheter right IJ approach with ultrasound and fluoroscopic guidance.  PRE-OPERATIVE DIAGNOSIS: Chronic renal insufficiency now requiring initiation of hemodialysis  POST-OPERATIVE DIAGNOSIS: Same  SURGEON: [name].  ANESTHESIA: Conscious sedation was administered under my direct supervision by the interventional radiology RN. IV Versed plus fentanyl were utilized. Continuous ECG, pulse oximetry and blood pressure was monitored throughout the entire procedure. Conscious sedation was for a total of 37 minutes.  ESTIMATED BLOOD LOSS: Minimal cc  CONTRAST USED:  None  FLUOROSCOPY TIME: 2.6 minutes  CONTRAST: 10 cc  INDICATIONS:   Ronald Abajian Duffeyis a 77 y.o. y.o. male who presents with worsening of his renal function now requires hemodialysis and therefore undergoing placement of a tunneled catheter.  Risks and benefits are reviewed all questions are answered patient agrees to proceed.  DESCRIPTION: After obtaining full informed written consent, the patient was positioned supine. The right neck and chest wall was prepped and draped in a sterile fashion. Ultrasound was placed in a sterile sleeve. Ultrasound was utilized to identify the right internal jugular vein which is noted to be echolucent and compressible indicating patency. Image is recorded for the permanent record. Under direct ultrasound visualization a micro-needle is inserted into the vein followed by the micro-wire. Micro-sheath was then advanced.  However there is some resistance made and the wire is advanced under fluoroscopic guidance.  It does a 360 degree loop and then courses down to the right atrium.  Given this finding I elected to place J wire under fluoroscopic guidance and then a 5 French sheath.  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  I was then able to negotiate an advantage wire under fluoroscopic guidance down through the superior vena cava and into the inferior vena cava under fluoroscopic guidance. Small counterincision was made at the wire insertion site. Dilators are passed over the wire and the tunneled dialysis catheter is pulled subcutaneously and fed into the central venous system without difficulty.  Under fluoroscopy the catheter tip positioned at the atrial caval junction.  Slight kink is noted in Amplatz Super Stiff wires are advanced through both lumens and then the catheter is adjusted with resolution of the kink.  Both lumens aspirate and flush easily. After verification of smooth contour with proper tip position under fluoroscopy the catheter is packed with 5000 units of heparin per lumen.  Catheter secured to the skin of the right chest wall with 0 silk. A sterile dressing is applied with a Biopatch.  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  There is slight narrowing associated with the neck of this diverticulum as is the jugular and subclavian course into the innominate.  Otherwise the innominate is widely patent as is the superior vena cava.  COMPLICATIONS: None  CONDITION: Good  [name] Ronald [name] renovascular. Office:  [phone]   09/10/2022,4:16 PM

## 2022-09-10 NOTE — H&P (View-Only) (Signed)
                       MRN : [id number]  Ronald [name] is a 76 y.o. (09/26/1945) male who presents with chief complaint of check access.  History of Present Illness:   The patient is seen for evaluation for dialysis access. 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  He is therefore undergoing placement of a tunneled catheter as an outpatient.    The patient notes the kidney problem has been present for a long time and has been progressively getting worse.  The patient is followed by nephrology.    The patient has been considering the various methods of dialysis and wishes to proceed with hemodialysis and therefore creation of AV access.  No recent shortening of the patient's walking distance or new symptoms consistent with claudication.  No history of rest pain symptoms. No new ulcers or wounds of the lower extremities have occurred.  The patient denies amaurosis fugax or recent TIA symptoms. There are no recent neurological changes noted. There is no history of DVT, PE or superficial thrombophlebitis. No recent episodes of angina or shortness of breath documented.   Current Meds  Medication Sig   allopurinol (ZYLOPRIM) 100 MG tablet TAKE 1 TABLET BY MOUTH EVERYDAY AT BEDTIME   amiodarone (PACERONE) 200 MG tablet Take 200 mg by mouth daily.   apixaban (ELIQUIS) 5 MG TABS tablet Take 1 tablet (5 mg total) by mouth 2 (two) times daily.   atorvastatin (LIPITOR) 40 MG tablet Take 1 tablet (40 mg total) by mouth at bedtime.   Cholecalciferol (VITAMIN D-3) 125 MCG (5000 UT) TABS Take 2,000 Units by mouth daily.    diphenhydrAMINE (BENADRYL) 25 mg capsule Take 25 mg by mouth every 6 (six) hours as needed.   furosemide (LASIX) 40 MG tablet Take 1.5 tablets (60 mg total) by mouth daily. Increased from 40 mg.   ibuprofen (ADVIL) 400 MG tablet Take 400 mg by mouth every 6 (six) hours as needed.    isosorbide mononitrate (IMDUR) 60 MG 24 hr tablet Take 1 tablet (60 mg total) by mouth daily. Increased from 30 mg.   levothyroxine (SYNTHROID) 75 MCG tablet Take 1 tablet (75 mcg total) by mouth daily before breakfast.   metoprolol tartrate (LOPRESSOR) 100 MG tablet Take 1.5 tablets (150 mg total) by mouth 2 (two) times daily. Increased from 100 mg twice daily.   Multiple Vitamin (MULTIVITAMIN PO) Take 1 tablet by mouth daily.   pantoprazole (PROTONIX) 40 MG tablet Take 1 tablet (40 mg total) by mouth 2 (two) times daily. Home med.   senna (SENOKOT) 8.6 MG tablet Take 1 tablet by mouth daily.   sodium bicarbonate 650 MG tablet Take 650 mg by mouth 2 (two) times daily.    Past Medical History:  Diagnosis Date   AAA (abdominal aortic aneurysm)    a. 3cm by US 2015.   Arthritis    "hips; back" (12/13/2014)   CAD (coronary artery disease) 2007   a. s/p CABG- IMA-LAD, VG-Cx, VG-RCA, VG-diag in 1999. B. sp redo CABG- VG-OM, VG-RCA in 2007 due to VG disease. c. NSTEMI 11/2014 s/p DES to SVG-OM from the Y graft.d. PTCA/DES x 1 distal body of SVG to Diagonal.09/2015   Chronic combined systolic and diastolic CHF (congestive heart failure)    a. remote EF 40-45% in 2006. b. Normal EF 2014. b.   Echo 07/2016 EF 45-50%, grade 1 DD. c. Echo 2020 30% to 35%. Diffuse EF 30-35%, diffuse hypokinesis.   Chronic lower back pain    CKD (chronic kidney disease), stage IV    COPD (chronic obstructive pulmonary disease)    Deafness in left ear    Degenerative disc disease, lumbar    Diabetes mellitus, type 2 10/04/2014   Microalbumin 05/11/2012-100. Foot exam/monofilament 05/11/2012-normal.   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 noted on CT 2008.   Type II diabetes mellitus    Diet control    Unstable angina 07/27/2017   Walking pneumonia 1990's   Wears dentures    full upper    Past Surgical History:  Procedure Laterality Date   CARDIAC CATHETERIZATION  "several"   CARDIAC CATHETERIZATION N/A 12/13/2014   Procedure: Left Heart Cath and Coronary Angiography;  Surgeon: Jayadeep S Varanasi, MD;  Location: [facility];  Service: Cardiovascular;  Laterality: N/A;   CARDIAC CATHETERIZATION  12/13/2014   Procedure: Coronary Stent Intervention;  Surgeon: Jayadeep S Varanasi, MD;  Location: [facility];  Service: Cardiovascular;;   CARDIAC CATHETERIZATION N/A 10/07/2015   Procedure: Left Heart Cath and Cors/Grafts Angiography;  Surgeon: Christopher D McAlhany, MD;  Location: [facility];  Service: Cardiovascular;  Laterality: N/A;   CARDIAC CATHETERIZATION N/A 10/07/2015   Procedure: Coronary Stent Intervention;  Surgeon: Christopher D McAlhany, MD;  Location: [facility];  Service: Cardiovascular;  Laterality: N/A;   CARDIOVERSION N/A 05/25/2022   Procedure: CARDIOVERSION;  Surgeon: Tobb, Kardie, DO;  Location: [facility];  Service: Cardiovascular;  Laterality: N/A;   CATARACT EXTRACTION W/PHACO Left 10/21/2021   Procedure: CATARACT EXTRACTION PHACO AND INTRAOCULAR LENS PLACEMENT (IOC) LEFT 3.94 00:33.7;  Surgeon: King, Bradley Mark, MD;  Location: [facility];  Service: Ophthalmology;  Laterality: Left;   CATARACT EXTRACTION W/PHACO Right 11/09/2021   Procedure: CATARACT EXTRACTION PHACO AND INTRAOCULAR LENS PLACEMENT (IOC) RIGHT;  Surgeon: King, Bradley Mark, MD;  Location: [facility];  Service: Ophthalmology;  Laterality: Right;  3.59 0:29.4   CORONARY ANGIOPLASTY  "several"   CORONARY  ANGIOPLASTY WITH STENT PLACEMENT  2005; 12/13/2014   "2; 1"   CORONARY ARTERY BYPASS GRAFT  1996   CABG X5   CORONARY ARTERY BYPASS GRAFT  March 2007   CABG X3   ESOPHAGOGASTRODUODENOSCOPY (EGD) WITH ESOPHAGEAL DILATION  2000   GREEN LIGHT LASER TURP (TRANSURETHRAL RESECTION OF PROSTATE  2000's   "not cancerous"   HERNIA REPAIR     LAPAROSCOPIC CHOLECYSTECTOMY     LEFT HEART CATH AND CORS/GRAFTS ANGIOGRAPHY N/A 07/28/2017   Procedure: LEFT HEART CATH AND CORS/GRAFTS ANGIOGRAPHY;  Surgeon: Kelly, Thomas A, MD;  Location: [facility];  Service: Cardiovascular;  Laterality: N/A;   LUNG SURGERY  1996   "S/P CABG, had to put staple in lung after it had collapsed"   UMBILICAL HERNIA REPAIR     w/chole    Social History Social History     Tobacco Use   Smoking status: Former    Packs/day: 3.00    Years: 20.00    Additional pack years: 0.00    Total pack years: 60.00    Types: Cigarettes    Quit date: 07/25/1986    Years since quitting: 36.1   Smokeless tobacco: Never  Vaping Use   Vaping Use: Never used  Substance Use Topics   Alcohol use: No    Alcohol/week: 0.0 standard drinks of alcohol   Drug use: No    Family History Family History  Problem Relation Age of Onset   Heart attack Mother        MI   Stroke Mother    Heart disease Mother    Hypertension Mother    Hyperlipidemia Mother    Asthma Mother    Heart disease Father    Rheumatic fever Father    Colon cancer Neg Hx     Allergies  Allergen Reactions   Predicort [Prednisolone] Other (See Comments)    Stomach pain   Ciprofloxacin Other (See Comments)    GI upset   Hydrochlorothiazide Other (See Comments)    Dehydration   Hydrocodone Nausea Only and Other (See Comments)    Stomach upset    Hydrocodone-Acetaminophen Nausea Only    Stomach upset   Sulfa Antibiotics Other (See Comments)    Cannot recall   Penicillins Hives, Rash and Other (See Comments)    Has patient had a PCN reaction causing  immediate rash, facial/tongue/throat swelling, SOB or lightheadedness with hypotension: YES  Has patient had a PCN reaction causing severe rash involving mucus membranes or skin necrosis: NO  Has patient had a PCN reaction that required hospitalization NO  Has patient had a PCN reaction occurring within the last 10 years:NO  If all of the above answers are "NO", then may proceed with Cephalosporin use.  Has patient had a PCN reaction causing immediate rash, facial/tongue/throat swelling, SOB or lightheadedness with hypotension: YES Has patient had a PCN reaction causing severe rash involving mucus membranes or skin necrosis: NO Has patient had a PCN reaction that required hospitalization NO Has patient had a PCN reaction occurring within the last 10 years:NO If all of the above answers are "NO", then may proceed with Cephalosporin use.     REVIEW OF SYSTEMS (Negative unless checked)  Constitutional: []Weight loss  []Fever  []Chills Cardiac: []Chest pain   []Chest pressure   []Palpitations   []Shortness of breath when laying flat   []Shortness of breath with exertion. Vascular:  []Pain in legs with walking   []Pain in legs at rest  []History of DVT   []Phlebitis   []Swelling in legs   []Varicose veins   []Non-healing ulcers Pulmonary:   []Uses home oxygen   []Productive cough   []Hemoptysis   []Wheeze  []COPD   []Asthma Neurologic:  []Dizziness   []Seizures   []History of stroke   []History of TIA  []Aphasia   []Vissual changes   []Weakness or numbness in arm   []Weakness or numbness in leg Musculoskeletal:   []Joint swelling   []Joint pain   []Low back pain Hematologic:  []Easy bruising  []Easy bleeding   []Hypercoagulable state   []Anemic Gastrointestinal:  []Diarrhea   []Vomiting  []Gastroesophageal reflux/heartburn   []Difficulty swallowing. Genitourinary:  [x]Chronic kidney disease   []Difficult urination  []Frequent urination   []Blood in urine Skin:  []Rashes   []Ulcers   Psychological:  []History of anxiety   [] History of   major depression.  Physical Examination  Vitals:   09/10/22 1335  BP: 137/80  Pulse: 91  Resp: 19  Temp: (!) 97.5 F (36.4 C)  TempSrc: Oral  SpO2: 95%  Weight: 105.2 kg  Height: 5' 9" (1.753 m)   Body mass index is 34.26 kg/m. Gen: WD/WN, NAD Head: [place]/AT, No temporalis wasting.  Ear/Nose/Throat: Hearing grossly intact, nares w/o erythema or drainage Eyes: PER, EOMI, sclera nonicteric.  Neck: Supple, no gross masses or lesions.  No JVD.  Pulmonary:  Good air movement, no audible wheezing, no use of accessory muscles.  Cardiac: RRR, precordium non-hyperdynamic. Vascular:    Vessel Right Left  Radial Palpable Palpable  Brachial Palpable Palpable  Gastrointestinal: soft, non-distended. No guarding/no peritoneal signs.  Musculoskeletal: M/S 5/5 throughout.  No deformity.  Neurologic: CN 2-12 intact. Pain and light touch intact in extremities.  Symmetrical.  Speech is fluent. Motor exam as listed above. Psychiatric: Judgment intact, Mood & affect appropriate for pt's clinical situation. Dermatologic: No rashes or ulcers noted.  No changes consistent with cellulitis.   CBC Lab Results  Component Value Date   WBC 5.5 07/17/2022   HGB 9.6 (L) 07/17/2022   HCT 30.1 (L) 07/17/2022   MCV 86.2 07/17/2022   PLT 121 (L) 07/17/2022    BMET    Component Value Date/Time   NA 140 07/17/2022 0458   NA 143 05/19/2022 1548   K 3.8 07/17/2022 0458   CL 107 07/17/2022 0458   CO2 23 07/17/2022 0458   GLUCOSE 100 (H) 07/17/2022 0458   GLUCOSE 82 06/13/2006 1621   BUN 67 (H) 07/17/2022 0458   BUN 50 (H) 05/19/2022 1548   CREATININE 4.21 (H) 07/17/2022 0458   CREATININE 2.44 (H) 11/24/2015 1546   CALCIUM 9.4 07/17/2022 0458   GFRNONAA 14 (L) 07/17/2022 0458   GFRAA 28 (L) 03/20/2020 1038   CrCl cannot be calculated (Patient's most recent lab result is older than the maximum 21 days allowed.).  COAG Lab Results  Component  Value Date   INR 1.6 (H) 07/15/2022   INR 1.64 06/12/2018   INR 0.95 06/10/2018    Radiology No results found.   Assessment/Plan End-stage renal disease: Patient has reached the point that he is now initiating hemodialysis.  Therefore a tunneled catheter has been recommended by nephrology service as an outpatient.  The risks and benefits have been reviewed with the patient all questions have been answered patient agrees to proceed.  He will follow-up in the office for further planning for hemodialysis access.  Coronary artery disease: Continue cardiac and antihypertensive medications as already ordered and reviewed, no changes at this time.  Continue statin as ordered and reviewed, no changes at this time  Nitrates PRN for chest pain  Paroxysmal atrial fibrillation: Continue antiarrhythmia medications as already ordered, these medications have been reviewed and there are no changes at this time.  Continue anticoagulation as ordered by Cardiology Service  Essential hypertension: Continue antihypertensive medications as already ordered, these medications have been reviewed and there are no changes at this time.  Lamorris Knoblock, MD  09/10/2022 3:06 PM   

## 2022-09-10 NOTE — Interval H&P Note (Signed)
History and Physical Interval Note:  09/10/2022 3:12 PM  Ronald [name]  has presented today for surgery, with the diagnosis of Perma Cath Insert    End Stage Renal.  The various methods of treatment have been discussed with the patient and family. After consideration of risks, benefits and other options for treatment, the patient has consented to  Procedure(s): DIALYSIS/PERMA CATHETER INSERTION (N/A) as a surgical intervention.  The patient's history has been reviewed, patient examined, no change in status, stable for surgery.  I have reviewed the patient's chart and labs.  Questions were answered to the patient's satisfaction.     [name]

## 2022-09-10 NOTE — Discharge Instructions (Signed)
Tunneled Catheter Insertion, Care After The following information offers guidance on how to care for yourself after your procedure. Your health care provider may also give you more specific instructions. If you have problems or questions, contact your health care provider. What can I expect after the procedure? After the procedure, it is common to have: Some mild redness, bruising, swelling, and pain around your catheter site. A small amount of blood or clear fluid coming from your incisions. Follow these instructions at home: Medicines Take over-the-counter and prescription medicines only as told by your health care provider. If you were prescribed an antibiotic medicine, take it as told by your health care provider. Do not stop taking the antibiotic even if you start to feel better. Incision care  Follow instructions from your health care provider about how to take care of your incisions. Make sure you: Your dialysis center will perform all needed dressing changes.  Leave stitches (sutures), skin glue, or adhesive strips in place. These skin closures may need to stay in place for 2 weeks or longer. Keep your dressings clean and dry. Check your incision areas every day for signs of infection. Check for: More redness, swelling, or pain. More fluid or blood. Warmth. Pus or a bad smell. Catheter care  Keep your catheter site clean and dry. Do not shower, sponge bath only while catheter in place.  Your dialysis center will Flush your catheter per the dialysis center protocol.  This helps prevent it from becoming clogged. Leave thecaps on the ends of the catheter when not in use. Do not pull on your catheter. Activity Return to your normal activities as told by your health care provider. Ask your health care provider what activities are safe for you. Follow any other activity restrictions as instructed by your health care provider. Do not lift anything that is heavier than 10 lb (4.5 kg), or  the limit that you are told, until your health care provider says that it is safe. Driving Do not drive for 24 hours.  General instructions Follow your health care provider's specific instructions for the type of catheter that you have. Do not take baths, swim, or use a hot tub while your catheter is in place. Keep all follow-up visits. This is important to prevent infection.  Contact a health care provider if: You feel unusually weak or nauseous. You have a fever or chills. You have more redness, swelling, or pain at your incisions or around the area where your catheter has been inserted or where it exits. You have pus or a bad smell coming from your catheter site. Your catheter site feels warm to the touch. Your catheter is not working properly. Fluid is leaking from the catheter, under the dressing, or around the dressing. Your dialysis center is unable to flush your catheter. Get help right away if: Your catheter develops a hole or it breaks. Your catheter comes loose or gets pulled completely out. If this happens, press on your catheter site firmly with a clean cloth until you can get medical help. You develop bleeding from your catheter or your insertion site, and your bleeding does not stop. You have swelling in your shoulder, neck, chest, or face. You have pain or swelling when fluids or medicines are being given through the catheter. You have chest pain or difficulty breathing. These symptoms may represent a serious problem that is an emergency. Do not wait to see if the symptoms will go away. Get medical help right away. Call your   local emergency services (911 in the [place] Do not drive yourself to the hospital. Summary After the procedure, it is common to have mild redness, swelling, and pain around your catheter site. Return to your normal activities as told by your health care provider. Ask your health care provider what activities are safe for you. Follow your health care  provider's specific instructions for the type of catheter that you have. Keep your catheter site and your dressings clean and dry. Contact a health care provider if your catheter is not working properly. Get help right away if you have chest pain, difficulty breathing, or your catheter comes loose or gets pulled completely out. This information is not intended to replace advice given to you by your health care provider. Make sure you discuss any questions you have with your health care provider. Document Revised: 09/22/2020 Document Reviewed: 09/22/2020 Elsevier Patient Education  2023 [employer]     

## 2022-09-13 ENCOUNTER — Ambulatory Visit: Payer: Self-pay

## 2022-09-13 ENCOUNTER — Encounter: Payer: Self-pay | Admitting: Vascular Surgery

## 2022-09-13 ENCOUNTER — Other Ambulatory Visit: Payer: Self-pay

## 2022-09-13 ENCOUNTER — Emergency Department: Payer: Medicare Other

## 2022-09-13 ENCOUNTER — Inpatient Hospital Stay
Admission: EM | Admit: 2022-09-13 | Discharge: 2022-09-16 | DRG: 291 | Disposition: A | Discharge status: Home Health | Payer: Medicare Other | Attending: Internal Medicine | Admitting: Internal Medicine

## 2022-09-13 DIAGNOSIS — Z955 Presence of coronary angioplasty implant and graft: Secondary | ICD-10-CM

## 2022-09-13 DIAGNOSIS — D696 Thrombocytopenia, unspecified: Secondary | ICD-10-CM | POA: Diagnosis not present

## 2022-09-13 DIAGNOSIS — Z992 Dependence on renal dialysis: Secondary | ICD-10-CM | POA: Diagnosis not present

## 2022-09-13 DIAGNOSIS — I5020 Unspecified systolic (congestive) heart failure: Secondary | ICD-10-CM | POA: Diagnosis present

## 2022-09-13 DIAGNOSIS — E785 Hyperlipidemia, unspecified: Secondary | ICD-10-CM | POA: Diagnosis not present

## 2022-09-13 DIAGNOSIS — K219 Gastro-esophageal reflux disease without esophagitis: Secondary | ICD-10-CM | POA: Diagnosis present

## 2022-09-13 DIAGNOSIS — I120 Hypertensive chronic kidney disease with stage 5 chronic kidney disease or end stage renal disease: Secondary | ICD-10-CM | POA: Diagnosis not present

## 2022-09-13 DIAGNOSIS — R600 Localized edema: Secondary | ICD-10-CM | POA: Diagnosis not present

## 2022-09-13 DIAGNOSIS — R5381 Other malaise: Secondary | ICD-10-CM | POA: Diagnosis present

## 2022-09-13 DIAGNOSIS — Z951 Presence of aortocoronary bypass graft: Secondary | ICD-10-CM

## 2022-09-13 DIAGNOSIS — Z79899 Other long term (current) drug therapy: Secondary | ICD-10-CM | POA: Diagnosis not present

## 2022-09-13 DIAGNOSIS — Z888 Allergy status to other drugs, medicaments and biological substances status: Secondary | ICD-10-CM

## 2022-09-13 DIAGNOSIS — J90 Pleural effusion, not elsewhere classified: Secondary | ICD-10-CM | POA: Diagnosis not present

## 2022-09-13 DIAGNOSIS — I2510 Atherosclerotic heart disease of native coronary artery without angina pectoris: Secondary | ICD-10-CM | POA: Diagnosis not present

## 2022-09-13 DIAGNOSIS — Z79890 Hormone replacement therapy (postmenopausal): Secondary | ICD-10-CM

## 2022-09-13 DIAGNOSIS — N186 End stage renal disease: Secondary | ICD-10-CM | POA: Diagnosis not present

## 2022-09-13 DIAGNOSIS — I480 Paroxysmal atrial fibrillation: Secondary | ICD-10-CM | POA: Diagnosis not present

## 2022-09-13 DIAGNOSIS — I132 Hypertensive heart and chronic kidney disease with heart failure and with stage 5 chronic kidney disease, or end stage renal disease: Secondary | ICD-10-CM | POA: Diagnosis not present

## 2022-09-13 DIAGNOSIS — Z9842 Cataract extraction status, left eye: Secondary | ICD-10-CM

## 2022-09-13 DIAGNOSIS — Z961 Presence of intraocular lens: Secondary | ICD-10-CM | POA: Diagnosis present

## 2022-09-13 DIAGNOSIS — E039 Hypothyroidism, unspecified: Secondary | ICD-10-CM | POA: Diagnosis not present

## 2022-09-13 DIAGNOSIS — I10 Essential (primary) hypertension: Secondary | ICD-10-CM | POA: Diagnosis not present

## 2022-09-13 DIAGNOSIS — J9811 Atelectasis: Secondary | ICD-10-CM | POA: Diagnosis not present

## 2022-09-13 DIAGNOSIS — R0602 Shortness of breath: Secondary | ICD-10-CM | POA: Diagnosis not present

## 2022-09-13 DIAGNOSIS — D649 Anemia, unspecified: Secondary | ICD-10-CM | POA: Diagnosis present

## 2022-09-13 DIAGNOSIS — I252 Old myocardial infarction: Secondary | ICD-10-CM | POA: Diagnosis not present

## 2022-09-13 DIAGNOSIS — E1122 Type 2 diabetes mellitus with diabetic chronic kidney disease: Secondary | ICD-10-CM | POA: Diagnosis not present

## 2022-09-13 DIAGNOSIS — D631 Anemia in chronic kidney disease: Secondary | ICD-10-CM | POA: Diagnosis present

## 2022-09-13 DIAGNOSIS — I714 Abdominal aortic aneurysm, without rupture, unspecified: Secondary | ICD-10-CM | POA: Diagnosis not present

## 2022-09-13 DIAGNOSIS — Z9841 Cataract extraction status, right eye: Secondary | ICD-10-CM

## 2022-09-13 DIAGNOSIS — N189 Chronic kidney disease, unspecified: Secondary | ICD-10-CM | POA: Diagnosis present

## 2022-09-13 DIAGNOSIS — Z882 Allergy status to sulfonamides status: Secondary | ICD-10-CM

## 2022-09-13 DIAGNOSIS — I5043 Acute on chronic combined systolic (congestive) and diastolic (congestive) heart failure: Secondary | ICD-10-CM | POA: Diagnosis present

## 2022-09-13 DIAGNOSIS — R0609 Other forms of dyspnea: Principal | ICD-10-CM

## 2022-09-13 DIAGNOSIS — K4090 Unilateral inguinal hernia, without obstruction or gangrene, not specified as recurrent: Secondary | ICD-10-CM | POA: Diagnosis not present

## 2022-09-13 DIAGNOSIS — J439 Emphysema, unspecified: Secondary | ICD-10-CM | POA: Diagnosis present

## 2022-09-13 DIAGNOSIS — Z885 Allergy status to narcotic agent status: Secondary | ICD-10-CM

## 2022-09-13 DIAGNOSIS — I25118 Atherosclerotic heart disease of native coronary artery with other forms of angina pectoris: Secondary | ICD-10-CM | POA: Diagnosis not present

## 2022-09-13 DIAGNOSIS — Z7901 Long term (current) use of anticoagulants: Secondary | ICD-10-CM | POA: Diagnosis not present

## 2022-09-13 DIAGNOSIS — N281 Cyst of kidney, acquired: Secondary | ICD-10-CM | POA: Diagnosis not present

## 2022-09-13 LAB — URINALYSIS, ROUTINE W REFLEX MICROSCOPIC
Bilirubin Urine: NEGATIVE
Glucose, UA: NEGATIVE mg/dL
Hgb urine dipstick: NEGATIVE
Ketones, ur: NEGATIVE mg/dL
Leukocytes,Ua: NEGATIVE
Nitrite: NEGATIVE
Protein, ur: NEGATIVE mg/dL
Specific Gravity, Urine: 1.01 (ref 1.005–1.030)
pH: 5 (ref 5.0–8.0)

## 2022-09-13 LAB — COMPREHENSIVE METABOLIC PANEL
ALT: 46 U/L — ABNORMAL HIGH (ref 0–44)
AST: 53 U/L — ABNORMAL HIGH (ref 15–41)
Albumin: 2.8 g/dL — ABNORMAL LOW (ref 3.5–5.0)
Alkaline Phosphatase: 140 U/L — ABNORMAL HIGH (ref 38–126)
Anion gap: 11 (ref 5–15)
BUN: 90 mg/dL — ABNORMAL HIGH (ref 8–23)
CO2: 25 mmol/L (ref 22–32)
Calcium: 8.8 mg/dL — ABNORMAL LOW (ref 8.9–10.3)
Chloride: 108 mmol/L (ref 98–111)
Creatinine, Ser: 5.02 mg/dL — ABNORMAL HIGH (ref 0.61–1.24)
GFR, Estimated: 11 mL/min — ABNORMAL LOW (ref >60)
Glucose, Bld: 129 mg/dL — ABNORMAL HIGH (ref 70–99)
Potassium: 3.8 mmol/L (ref 3.5–5.1)
Sodium: 144 mmol/L (ref 135–145)
Total Bilirubin: 1.3 mg/dL — ABNORMAL HIGH (ref 0.3–1.2)
Total Protein: 5.8 g/dL — ABNORMAL LOW (ref 6.5–8.1)

## 2022-09-13 LAB — CBC
HCT: 28.2 % — ABNORMAL LOW (ref 39.0–52.0)
Hemoglobin: 8.6 g/dL — ABNORMAL LOW (ref 13.0–17.0)
MCH: 26.7 pg (ref 26.0–34.0)
MCHC: 30.5 g/dL (ref 30.0–36.0)
MCV: 87.6 fL (ref 80.0–100.0)
Platelets: 143 10*3/uL — ABNORMAL LOW (ref 150–400)
RBC: 3.22 MIL/uL — ABNORMAL LOW (ref 4.22–5.81)
RDW: 18.4 % — ABNORMAL HIGH (ref 11.5–15.5)
WBC: 6.8 10*3/uL (ref 4.0–10.5)
nRBC: 0 % (ref 0.0–0.2)

## 2022-09-13 LAB — BRAIN NATRIURETIC PEPTIDE: B Natriuretic Peptide: 1700.3 pg/mL — ABNORMAL HIGH (ref 0.0–100.0)

## 2022-09-13 LAB — LIPASE, BLOOD: Lipase: 42 U/L (ref 11–51)

## 2022-09-13 LAB — TROPONIN I (HIGH SENSITIVITY): Troponin I (High Sensitivity): 18 ng/L — ABNORMAL HIGH (ref <18)

## 2022-09-13 MED ORDER — SODIUM CHLORIDE 0.9% FLUSH
3.0000 mL | Freq: Two times a day (BID) | INTRAVENOUS | Status: DC
  Administered 2022-09-13 – 2022-09-15 (×2): 3 mL via INTRAVENOUS

## 2022-09-13 MED ORDER — FUROSEMIDE 40 MG PO TABS: 60.0000 mg | ORAL_TABLET | Freq: Every day | ORAL | Status: DC

## 2022-09-13 MED ORDER — FUROSEMIDE 10 MG/ML IJ SOLN
80.0000 mg | Freq: Once | INTRAMUSCULAR | Status: AC
  Administered 2022-09-13: 80 mg via INTRAVENOUS
  Filled 2022-09-13: qty 8

## 2022-09-13 MED ORDER — ALLOPURINOL 100 MG PO TABS
100.0000 mg | ORAL_TABLET | Freq: Every day | ORAL | Status: DC
  Administered 2022-09-14: 100 mg via ORAL
  Filled 2022-09-13 (×2): qty 1

## 2022-09-13 MED ORDER — SODIUM CHLORIDE 0.9% FLUSH
3.0000 mL | Freq: Two times a day (BID) | INTRAVENOUS | Status: DC
  Administered 2022-09-13 – 2022-09-15 (×3): 3 mL via INTRAVENOUS

## 2022-09-13 MED ORDER — ISOSORBIDE MONONITRATE ER 60 MG PO TB24: 60.0000 mg | ORAL_TABLET | Freq: Every day | ORAL | Status: DC

## 2022-09-13 MED ORDER — ACETAMINOPHEN 500 MG PO TABS
500.0000 mg | ORAL_TABLET | Freq: Every day | ORAL | Status: DC
  Administered 2022-09-13 – 2022-09-15 (×3): 500 mg via ORAL
  Filled 2022-09-13 (×3): qty 1

## 2022-09-13 MED ORDER — APIXABAN 5 MG PO TABS
5.0000 mg | ORAL_TABLET | Freq: Two times a day (BID) | ORAL | Status: DC
  Administered 2022-09-13 – 2022-09-15 (×4): 5 mg via ORAL
  Filled 2022-09-13 (×4): qty 1

## 2022-09-13 MED ORDER — AMIODARONE HCL 200 MG PO TABS
200.0000 mg | ORAL_TABLET | Freq: Every day | ORAL | Status: DC
  Administered 2022-09-13 – 2022-09-14 (×2): 200 mg via ORAL
  Filled 2022-09-13 (×2): qty 1

## 2022-09-13 MED ORDER — LEVOTHYROXINE SODIUM 50 MCG PO TABS
75.0000 ug | ORAL_TABLET | Freq: Every day | ORAL | Status: DC
  Administered 2022-09-14 – 2022-09-16 (×3): 75 ug via ORAL
  Filled 2022-09-13 (×3): qty 2

## 2022-09-13 MED ORDER — MORPHINE SULFATE (PF) 2 MG/ML IV SOLN: 2.0000 mg | INTRAVENOUS | Status: DC | PRN

## 2022-09-13 MED ORDER — ATORVASTATIN CALCIUM 20 MG PO TABS
40.0000 mg | ORAL_TABLET | Freq: Every day | ORAL | Status: DC
  Administered 2022-09-13 – 2022-09-15 (×3): 40 mg via ORAL
  Filled 2022-09-13 (×3): qty 2

## 2022-09-13 MED ORDER — PANTOPRAZOLE SODIUM 40 MG PO TBEC
40.0000 mg | DELAYED_RELEASE_TABLET | Freq: Two times a day (BID) | ORAL | Status: DC
  Administered 2022-09-13 – 2022-09-15 (×4): 40 mg via ORAL
  Filled 2022-09-13 (×4): qty 1

## 2022-09-13 MED ORDER — METOPROLOL TARTRATE 50 MG PO TABS
150.0000 mg | ORAL_TABLET | Freq: Two times a day (BID) | ORAL | Status: DC
  Administered 2022-09-13: 150 mg via ORAL
  Filled 2022-09-13: qty 3

## 2022-09-13 MED ORDER — SODIUM CHLORIDE 0.9% FLUSH: 3.0000 mL | INTRAVENOUS | Status: DC | PRN

## 2022-09-13 MED ORDER — SODIUM BICARBONATE 650 MG PO TABS
650.0000 mg | ORAL_TABLET | Freq: Two times a day (BID) | ORAL | Status: DC
  Administered 2022-09-14 – 2022-09-15 (×3): 650 mg via ORAL
  Filled 2022-09-13 (×4): qty 1

## 2022-09-13 MED ORDER — SODIUM CHLORIDE 0.9 % IV SOLN: 250.0000 mL | INTRAVENOUS | Status: DC | PRN

## 2022-09-13 MED ORDER — POLYETHYLENE GLYCOL 3350 17 G PO PACK
17.0000 g | PACK | Freq: Every day | ORAL | Status: DC
  Administered 2022-09-13: 17 g via ORAL
  Filled 2022-09-13 (×2): qty 1

## 2022-09-13 NOTE — ED Triage Notes (Signed)
Patient to ED via POV for bilateral leg swelling. Patient states they have been doing peritoneal dialysis but unable to due much due to pain. Suppose to start hemodialysis tomorrow. Overall not feeling well. Having some lower abd pain as well.

## 2022-09-13 NOTE — H&P (Addendum)
History and Physical     PatientBraylynn Ghan [name] ZOX:[id number] DOB: Nov 01, 1945 DOA: 09/13/2022 DOS: the patient was seen and examined on 09/14/2022 PCP: Reubin Milan, MD   Patient coming from: Home  Chief Complaint: Leg swelling  HISTORY OF PRESENT ILLNESS: BRANDI [name] is an 77 y.o. male brought for bilateral lower extremity edema.  Patient has end-stage renal disease stage was started on peritoneal dialysis few week ago and due to intolerance due to abdominal pain patient had a PermCath placed. Patient is followed by Dr. Cherylann Ratel with his nephrologist.  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 Patient was last admitted in February 2024 for chest pain where he received heparin gtt. per note no cardiac cath given patient's end-stage renal disease.  Nephrology was consulted for end-stage renal disease stage IV at that time.  Patient is that he does make some urine. Amiodarone Eliquis for his atrial fibrillation and Synthroid for his hypothyroidism. Patient also has chronic stable anemia with baseline hemoglobin of 9, but also intermittent thrombocytopenia that has been chronic. Today patient comes with swelling that is been progressively getting worse he supposed to have hemodialysis tomorrow.  Patient reports shortness of breath and is not hypoxic on room air. Chart review shows that patient right internal jugular dialysis catheter placed by Dr. Gilda Crease on 10 September 2022.  On arrival patient also had complaints of generalized abdominal pain not present on my exam.    Past Medical History:  Diagnosis Date   AAA (abdominal aortic aneurysm)    a. 3cm by [place] 2015.   Arthritis    "hips; back" (12/13/2014)   CAD  (coronary artery disease) 2007   a. s/p CABG- IMA-LAD, VG-Cx, VG-RCA, VG-diag in 1999. B. sp redo CABG- VG-OM, VG-RCA in 2007 due to VG disease. c. NSTEMI 11/2014 s/p DES to SVG-OM from the Y graft.d. PTCA/DES x 1 distal body of SVG to Diagonal.09/2015   Chronic combined systolic and diastolic CHF (congestive heart failure)    a. remote EF 40-45% in 2006. b. Normal EF 2014. b. Echo 07/2016 EF 45-50%, grade 1 DD. c. Echo 2020 30% to 35%. Diffuse EF 30-35%, diffuse hypokinesis.   Chronic lower back pain    CKD (chronic kidney disease), stage IV    COPD (chronic obstructive pulmonary disease)    Deafness in left ear    Degenerative disc disease, lumbar    Diabetes mellitus, type 2 10/04/2014   Microalbumin 05/11/2012-100. Foot exam/monofilament 05/11/2012-normal.   Dilated cardiomyopathy 10/07/2015   Emphysema    Esophageal stricture 07/02/1998   EGD   Genital candidiasis in male 10/25/2012   GERD (gastroesophageal reflux disease)    History of gout    "last flareup was in 2007" (12/13/2014)   History of hiatal hernia    Hyperlipidemia    Hypertension    Ischemic cardiomyopathy 2006   Echo 2020: EF 30-35%, diffuse hypokinesis   NSTEMI (non-ST elevated myocardial infarction) 12/13/2014   PVC's (premature ventricular contractions)    Renal artery stenosis    a. noted on CT 2008.   Type II diabetes mellitus    Diet control    Unstable angina 07/27/2017   Walking pneumonia 1990's   Wears dentures  full upper   Review of Systems  Respiratory:  Positive for shortness of breath.   Cardiovascular:  Positive for leg swelling.  Gastrointestinal:  Positive for abdominal distention and abdominal pain.  All other systems reviewed and are negative.  Allergies  Allergen Reactions   Predicort [Prednisolone] Other (See Comments)    Stomach pain   Ciprofloxacin Other (See Comments)    GI upset   Hydrochlorothiazide Other (See Comments)    Dehydration   Hydrocodone Nausea Only and Other (See  Comments)    Stomach upset    Hydrocodone-Acetaminophen Nausea Only    Stomach upset   Sulfa Antibiotics Other (See Comments)    Cannot recall   Penicillins Hives, Rash and Other (See Comments)    Has patient had a PCN reaction causing immediate rash, facial/tongue/throat swelling, SOB or lightheadedness with hypotension: YES  Has patient had a PCN reaction causing severe rash involving mucus membranes or skin necrosis: NO  Has patient had a PCN reaction that required hospitalization NO  Has patient had a PCN reaction occurring within the last 10 years:NO  If all of the above answers are "NO", then may proceed with Cephalosporin use.  Has patient had a PCN reaction causing immediate rash, facial/tongue/throat swelling, SOB or lightheadedness with hypotension: YES Has patient had a PCN reaction causing severe rash involving mucus membranes or skin necrosis: NO Has patient had a PCN reaction that required hospitalization NO Has patient had a PCN reaction occurring within the last 10 years:NO If all of the above answers are "NO", then may proceed with Cephalosporin use.   Past Surgical History:  Procedure Laterality Date   CARDIAC CATHETERIZATION  "several"   CARDIAC CATHETERIZATION N/A 12/13/2014   Procedure: Left Heart Cath and Coronary Angiography;  Surgeon: Corky Crafts, MD;  Location[name] INVAS[facility] CV LAB;  Service: Cardiovascular;  Laterality: N/A;   CARDIAC CATHETERIZATION  12/13/2014   Procedure: Coronary Stent Intervention;  Surgeon: Corky Crafts, MD;  Location: [facility] INVASIVE CV LAB;  Service: Cardiovascular;;   CARDIAC CATHETERIZATION N/A 10/07/2015   Procedure: Left Heart Cath and Cors/Grafts Angiography;  Surgeon: Kathleene Hazel, MD;  [facility] INVAS[facility] CV LAB;  Service: Cardiovascular;  Laterality: N/A;   CARDIAC CATHETERIZATION N/A 10/07/2015   Procedure: Coronary Stent Intervention;  Surgeon: Kathleene Hazel, MD;  Location: [facility] INVASIVE CV LAB;  Service:  Cardiovascular;  Laterality: N/A;   CARDIOVERSION N/A 05/25/2022   Procedure: CARDIOVERSION;  Surgeon: Thomasene Ripple, DO;  Location: [facility] ENDOSCOPY;  Service: Cardiovascular;  Laterality: N/A;   CATARACT EXTRACTION W/PHACO Left 10/21/2021   Procedure: CATARACT EXTRACTION PHACO AND INTRAOCULAR LENS PLACEMENT (IOC) LEFT 3.94 00:33.7;  Surgeon: Nevada Crane, MD;  Location: [facility]GERY [facility];  Service: Ophthalmology;  Laterality: Left;   CATARACT EXTRACTION W/PHACO Right 11/09/2021   Procedure: CATARACT EXTRACTION PHACO AND INTRAOCULAR LENS PLACEMENT (IOC) RIGHT;  Surgeon: Nevada Crane, MD;  Location: [name];  Service: Ophthalmology;  Laterality: Right;  3.59 0:29.4   CORONARY ANGIOPLASTY  "several"   CORONARY ANGIOPLASTY WITH STENT PLACEMENT  2005; 12/13/2014   "2; 1"   CORONARY ARTERY BYPASS GRAFT  1996   CABG X5   CORONARY ARTERY BYPASS GRAFT  March 2007   CABG X3   DIALYSIS/PERMA CATHETER INSERTION N/A 09/10/2022   Procedure: DIALYSIS/PERMA CATHETER INSERTION;  Surgeon: Renford Dills, MD;  Location: [facility] INVASIVE CV LAB;  Service: Cardiovascular;  Laterality: N/A;   ESOPHAGOGASTRODUODENOSCOPY (EGD) WITH ESOPHAGEAL DILATION  2000   GREEN  LIGHT LASER TURP (TRANSURETHRAL RESECTION OF PROSTATE  2000's   "not cancerous"   HERNIA REPAIR     LAPAROSCOPIC CHOLECYSTECTOMY     LEFT HEART CATH AND CORS/GRAFTS ANGIOGRAPHY N/A 07/28/2017   Procedure: LEFT HEART CATH AND CORS/GRAFTS ANGIOGRAPHY;  Surgeon: Lennette Bihari, MD;  Location: [facility] INVASIVE CV LAB;  Service: Cardiovascular;  Laterality: N/A;   LUNG SURGERY  1996   "S/P CABG, had to put staple in lung after it had collapsed"   UMBILICAL HERNIA REPAIR     w/chole   MEDICATIONS: Prior to Admission medications   Medication Sig Start Date End Date Taking? Authorizing Provider  acetaminophen (TYLENOL) 650 MG CR tablet Take 1,300 mg by mouth at bedtime.    [provider]  allopurinol (ZYLOPRIM) 100 MG tablet TAKE  1 TABLET BY MOUTH EVERYDAY AT BEDTIME 01/11/22   Bosie Clos, MD  amiodarone (PACERONE) 200 MG tablet Take 200 mg by mouth daily.    [provider]  apixaban (ELIQUIS) 5 MG TABS tablet Take 1 tablet (5 mg total) by mouth 2 (two) times daily. 09/09/21   Kathleene Hazel, MD  atorvastatin (LIPITOR) 40 MG tablet Take 1 tablet (40 mg total) by mouth at bedtime. 09/08/22   Reubin Milan, MD  Cholecalciferol (VITAMIN D-3) 125 MCG (5000 UT) TABS Take 2,000 Units by mouth daily.     [provider]  diphenhydrAMINE (BENADRYL) 25 mg capsule Take 25 mg by mouth every 6 (six) hours as needed.    [provider]  furosemide (LASIX) 40 MG tablet Take 1.5 tablets (60 mg total) by mouth daily. Increased from 40 mg. 07/17/22 10/15/22  Darlin Priestly, MD  ibuprofen (ADVIL) 400 MG tablet Take 400 mg by mouth every 6 (six) hours as needed.    [provider]  isosorbide mononitrate (IMDUR) 60 MG 24 hr tablet Take 1 tablet (60 mg total) by mouth daily. Increased from 30 mg. 07/17/22 10/15/22  Darlin Priestly, MD  levothyroxine (SYNTHROID) 75 MCG tablet Take 1 tablet (75 mcg total) by mouth daily before breakfast. 01/11/22   Bosie Clos, MD  loratadine (CLARITIN) 10 MG tablet Take 10 mg by mouth daily.    [provider]  metoprolol tartrate (LOPRESSOR) 100 MG tablet Take 1.5 tablets (150 mg total) by mouth 2 (two) times daily. Increased from 100 mg twice daily. 07/17/22 10/15/22  Darlin Priestly, MD  Multiple Vitamin (MULTIVITAMIN PO) Take 1 tablet by mouth daily.    [provider]  nitroGLYCERIN (NITROSTAT) 0.4 MG SL tablet Place 0.4 mg under the tongue every 5 (five) minutes as needed for chest pain (Up to 3 times).    [provider]  pantoprazole (PROTONIX) 40 MG tablet Take 1 tablet (40 mg total) by mouth 2 (two) times daily. Home med. 07/17/22   Darlin Priestly, MD  polyethylene glycol (MIRALAX / GLYCOLAX) 17 g packet Take 17 g by mouth at bedtime.     [provider]  senna (SENOKOT) 8.6 MG tablet Take 1 tablet by mouth daily.    [provider]  sodium bicarbonate 650 MG tablet Take 650 mg by mouth 2 (two) times daily.    [provider]   ED Course: Pt in Ed alert awake oriented afebrile with O2 sats of 100% on room air. Vitals:   09/13/22 2100 09/13/22 2200 09/13/22 2300 09/14/22 0000  BP: 134/87 (!) 141/81 128/67 125/72  Pulse: 91 89 89 89  Resp: 18 15 14  13  Temp:      TempSrc:      SpO2: 95% 93% 90% 92%   No intake/output data recorded. SpO2: 92 % Blood work in ed shows: CMP shows glucose of 129 creatinine of 5.2 normal potassium alk phos 140 albumin 2.8 AST 53 ALT 46 total protein 5.8 total bili 1.3. Troponin of 18/BNP of 1700.3. White count of 6.8 hemoglobin 9.3 which is at baseline 8.6 platelet count of 143. In the emergency room patient had CT of chest abdomen and pelvis showing: IMPRESSION: 1. Small bilateral pleural effusions with associated passive atelectasis. 2. Patchy ground-glass opacities favoring the upper lobes, nonspecific for edema versus atypical/viral pneumonia although I favor the former. 3. Moderate cardiomegaly. Low-density blood pool compatible with anemia. 4. Small amount of ascites primarily in the vicinity of the peritoneal dialysis catheter. 5. Infrarenal abdominal aortic aneurysm 3.1 cm in anterior-posterior dimension, stable. Recommend follow up ultrasound every 3 years. 6. Bilateral renal atrophy. 7. Small umbilical hernia contains adipose tissue. 8. Aortic atherosclerosis. 9. Emphysema. 10. Recently placed hemodialysis catheter appear satisfactorily positioned. In ED pt received lasix 80 mg x 1. 400 ml output.   Results for orders placed or performed during the [name] encounter of 09/13/22 (from the past 72 hour(s))  Lipase, blood     Status: None   Collection Time: 09/13/22  2:29 PM  Result Value Ref Range   Lipase 42 11 - 51 U/L    Comment: Performed  at [facility], [address] [place], [place] [address]  Comprehensive metabolic panel     Status: Abnormal   Collection Time: 09/13/22  2:29 PM  Result Value Ref Range   Sodium 144 135 - 145 mmol/L   Potassium 3.8 3.5 - 5.1 mmol/L   Chloride 108 98 - 111 mmol/L   CO2 25 22 - 32 mmol/L   Glucose, Bld 129 (H) 70 - 99 mg/dL    Comment: Glucose reference range applies only to samples taken after fasting for at least 8 hours.   BUN 90 (H) 8 - 23 mg/dL   Creatinine, Ser 6.04 (H) 0.61 - 1.24 mg/dL   Calcium 8.8 (L) 8.9 - 10.3 mg/dL   Total Protein 5.8 (L) 6.5 - 8.1 g/dL   Albumin 2.8 (L) 3.5 - 5.0 g/dL   AST 53 (H) 15 - 41 U/L   ALT 46 (H) 0 - 44 U/L   Alkaline Phosphatase 140 (H) 38 - 126 U/L   Total Bilirubin 1.3 (H) 0.3 - 1.2 mg/dL   GFR, Estimated 11 (L) >60 mL/min    Comment: (NOTE) Calculated using the CKD-EPI Creatinine Equation (2021)    Anion gap 11 5 - 15    Comment: Performed at [facility], [address] [place], [place] [address]  CBC     Status: Abnormal   Collection Time: 09/13/22  2:29 PM  Result Value Ref Range   WBC 6.8 4.0 - 10.5 K/uL   RBC 3.22 (L) 4.22 - 5.81 MIL/uL   Hemoglobin 8.6 (L) 13.0 - 17.0 g/dL   HCT 11.9 (L) 14.7 - 82.9 %   MCV 87.6 80.0 - 100.0 fL   MCH 26.7 26.0 - 34.0 pg   MCHC 30.5 30.0 - 36.0 g/dL   RDW 56.2 (H) 13.0 - 86.5 %   Platelets 143 (L) 150 - 400 K/uL   nRBC 0.0 0.0 - 0.2 %    Comment: Performed at [name], [address] [place], [place] [address]  Brain natriuretic  peptide     Status: Abnormal   Collection Time: 09/13/22  2:29 PM  Result Value Ref Range   B Natriuretic Peptide 1,700.3 (H) 0.0 - 100.0 pg/mL    Comment: Performed at [facility], [address] [place], [place] [address]  Troponin I (High Sensitivity)     Status: Abnormal   Collection Time: 09/13/22  2:29 PM  Result Value Ref Range   Troponin I (High Sensitivity) 18 (H) <18 ng/L    Comment: (NOTE) Elevated  high sensitivity troponin I (hsTnI) values and significant  changes across serial measurements may suggest ACS but many other  chronic and acute conditions are known to elevate hsTnI results.  Refer to the "Links" section for chest pain algorithms and additional  guidance. Performed at [name], [address] [place], [place] [address]   Urinalysis, Routine w reflex microscopic -Urine, Clean Catch     Status: Abnormal   Collection Time: 09/13/22  5:16 PM  Result Value Ref Range   Color, Urine YELLOW (A) YELLOW   APPearance CLEAR (A) CLEAR   Specific Gravity, Urine 1.010 1.005 - 1.030   pH 5.0 5.0 - 8.0   Glucose, UA NEGATIVE NEGATIVE mg/dL   Hgb urine dipstick NEGATIVE NEGATIVE   Bilirubin Urine NEGATIVE NEGATIVE   Ketones, ur NEGATIVE NEGATIVE mg/dL   Protein, ur NEGATIVE NEGATIVE mg/dL   Nitrite NEGATIVE NEGATIVE   Leukocytes,Ua NEGATIVE NEGATIVE    Comment: Performed at [facility], [address] [place], [place] [address]    Lab Results  Component Value Date   CREATININE 5.02 (H) 09/13/2022   CREATININE 4.6 (A) 09/06/2022   CREATININE 4.21 (H) 07/17/2022      Latest Ref Rng & Units 09/13/2022    2:29 PM 09/06/2022   12:00 AM 07/17/2022    4:58 AM  CMP  Glucose 70 - 99 mg/dL 829   562   BUN 8 - 23 mg/dL 90  85     67   Creatinine 0.61 - 1.24 mg/dL 1.30  4.6     8.65   Sodium 135 - 145 mmol/L 144  143     140   Potassium 3.5 - 5.1 mmol/L 3.8  3.7     3.8   Chloride 98 - 111 mmol/L 108   107   CO2 22 - 32 mmol/L 25  27     23    Calcium 8.9 - 10.3 mg/dL 8.8  9.5     9.4   Total Protein 6.5 - 8.1 g/dL 5.8     Total Bilirubin 0.3 - 1.2 mg/dL 1.3     Alkaline Phos 38 - 126 U/L 140     AST 15 - 41 U/L 53     ALT 0 - 44 U/L 46        This result is from an external source.   Unresulted Labs (From admission, onward)     Start     Ordered   09/14/22 0500  Comprehensive metabolic panel  Tomorrow morning,   STAT        09/13/22 1953   09/14/22  0500  CBC  Tomorrow morning,   STAT        09/13/22 1953           Pt has received : Orders Placed This Encounter  Procedures   CT CHEST ABDOMEN PELVIS WO CONTRAST    Standing Status:   Standing    Number of Occurrences:  1    Order Specific Question:   Is Oral Contrast requested for this exam?    Answer:   No oral contrast    Order Specific Question:   Reason for No Oral Contrast    Answer:   Not tolerating liquids   Lipase, blood    Standing Status:   Standing    Number of Occurrences:   1   Comprehensive metabolic panel    Standing Status:   Standing    Number of Occurrences:   1   CBC    Standing Status:   Standing    Number of Occurrences:   1   Urinalysis, Routine w reflex microscopic -Urine, Clean Catch    Standing Status:   Standing    Number of Occurrences:   1    Order Specific Question:   Specimen Source    Answer:   Urine, Clean Catch [76]   Brain natriuretic peptide    Standing Status:   Standing    Number of Occurrences:   1   Comprehensive metabolic panel    Standing Status:   Standing    Number of Occurrences:   1   CBC    Standing Status:   Standing    Number of Occurrences:   1   Diet renal with fluid restriction Fluid restriction: 1200 mL Fluid; Room service appropriate? Yes; Fluid consistency: Thin    Standing Status:   Standing    Number of Occurrences:   1    Order Specific Question:   Fluid restriction:    Answer:   1200 mL Fluid    Order Specific Question:   Room service appropriate?    Answer:   Yes    Order Specific Question:   Fluid consistency:    Answer:   Thin   Maintain IV access    Standing Status:   Standing    Number of Occurrences:   1   Vital signs    Standing Status:   Standing    Number of Occurrences:   1   Notify physician (specify)    Standing Status:   Standing    Number of Occurrences:   20    Order Specific Question:   Notify Physician    Answer:   for pulse less than 55 or greater than 120    Order Specific  Question:   Notify Physician    Answer:   for respiratory rate less than 12 or greater than 25    Order Specific Question:   Notify Physician    Answer:   for temperature greater than 100.5 F    Order Specific Question:   Notify Physician    Answer:   for urinary output less than 30 mL/hr for four hours    Order Specific Question:   Notify Physician    Answer:   for systolic BP less than 90 or greater than 160, diastolic BP less than 60 or greater than 100    Order Specific Question:   Notify Physician    Answer:   for new hypoxia w/ oxygen saturations < 88%   Progressive Mobility Protocol: No Restrictions    Standing Status:   Standing    Number of Occurrences:   1   Daily weights    Standing Status:   Standing    Number of Occurrences:   1   Intake and Output    Standing Status:   Standing    Number of Occurrences:  1   Do not place and if present remove PureWick    Standing Status:   Standing    Number of Occurrences:   1   Initiate Oral Care Protocol    Standing Status:   Standing    Number of Occurrences:   1   Initiate Carrier Fluid Protocol    Standing Status:   Standing    Number of Occurrences:   1   RN may order General Admission PRN Orders utilizing "General Admission PRN medications" (through manage orders) for the following patient needs: allergy symptoms (Claritin), cold sores (Carmex), cough (Robitussin DM), eye irritation (Liquifilm Tears), hemorrhoids (Tucks), indigestion (Maalox), minor skin irritation (Hydrocortisone Cream), muscle pain Romeo Apple Gay), nose irritation (saline nasal spray) and sore throat (Chloraseptic spray).    Standing Status:   Standing    Number of Occurrences:   [id number]   Cardiac Monitoring Continuous x 48 hours Indications for use: Other; Other indications for use: hd/ Pleural effusion    Standing Status:   Standing    Number of Occurrences:   1    Order Specific Question:   Indications for use:    Answer:   Other    Order Specific Question:    Other indications for use:    Answer:   hd/ Pleural effusion   Full code    Standing Status:   Standing    Number of Occurrences:   1    Order Specific Question:   By:    Answer:   Other   Consult to hospitalist    Standing Status:   Standing    Number of Occurrences:   1    Order Specific Question:   Place call to:    Answer:   ED, callback [phone]    Order Specific Question:   Reason for Consult    Answer:   Admit    Order Specific Question:   Diagnosis/Clinical Info for Consult:    Answer:   chf / volume overload   Consult to nephrology Consult Timeframe: ROUTINE - requires response within 24 hours; Reason for Consult? ESRD on HD. SOB/ LE edema.    Standing Status:   Standing    Number of Occurrences:   1    Order Specific Question:   Consult Timeframe    Answer:   ROUTINE - requires response within 24 hours    Order Specific Question:   Reason for Consult?    Answer:   ESRD on HD. SOB/ LE edema.   Pulse oximetry check with vital signs    Standing Status:   Standing    Number of Occurrences:   1   Oxygen therapy Mode or (Route): Nasal cannula; Liters Per Minute: 2; Keep 02 saturation: greater than 92 %    Standing Status:   Standing    Number of Occurrences:   20    Order Specific Question:   Mode or (Route)    Answer:   Nasal cannula    Order Specific Question:   Liters Per Minute    Answer:   2    Order Specific Question:   Keep 02 saturation    Answer:   greater than 92 %   ED EKG    Standing Status:   Standing    Number of Occurrences:   1    Order Specific Question:   Reason for Exam    Answer:   Weakness   Place in observation (patient's expected length of stay will  be less than 2 midnights)    Standing Status:   Standing    Number of Occurrences:   1    Order Specific Question:   [name] Area    Answer:   [name] [[id number]]    Order Specific Question:   Level of Care    Answer:   Telemetry Medical [104]    Order Specific Question:    Covid Evaluation    Answer:   Asymptomatic - no recent exposure (last 10 days) testing not required    Order Specific Question:   Diagnosis    Answer:   Bilateral leg edema [342635]    Order Specific Question:   Admitting Physician    Answer:   Darrold Junker    Order Specific Question:   Attending Physician    Answer:   Darrold Junker   Aspiration precautions    Standing Status:   Standing    Number of Occurrences:   1   Fall precautions    Standing Status:   Standing    Number of Occurrences:   1    Meds ordered this encounter  Medications   furosemide (LASIX) injection 80 mg   acetaminophen (TYLENOL) tablet 500 mg   allopurinol (ZYLOPRIM) tablet 100 mg   amiodarone (PACERONE) tablet 200 mg   apixaban (ELIQUIS) tablet 5 mg   atorvastatin (LIPITOR) tablet 40 mg   DISCONTD: furosemide (LASIX) tablet 60 mg    Increased from 40 mg.     levothyroxine (SYNTHROID) tablet 75 mcg   DISCONTD: isosorbide mononitrate (IMDUR) 24 hr tablet 60 mg    Increased from 30 mg.     DISCONTD: metoprolol tartrate (LOPRESSOR) tablet 150 mg    Increased from 100 mg twice daily.     pantoprazole (PROTONIX) EC tablet 40 mg   sodium bicarbonate tablet 650 mg   polyethylene glycol (MIRALAX / GLYCOLAX) packet 17 g   sodium chloride flush (NS) 0.9 % injection 3 mL   morphine (PF) 2 MG/ML injection 2 mg   sodium chloride flush (NS) 0.9 % injection 3 mL   sodium chloride flush (NS) 0.9 % injection 3 mL   0.9 %  sodium chloride infusion   metoprolol tartrate (LOPRESSOR) tablet 50 mg    Increased from 100 mg twice daily.      Admission Imaging : CT CHEST ABDOMEN PELVIS WO CONTRAST  Result Date: 09/13/2022 CLINICAL DATA:  Shortness of breath, fluid overload, leg swelling, abdominal distension. Recent peritoneal dialysis, anticipation of starting hemodialysis tomorrow. Lower abdominal pain. EXAM: CT CHEST, ABDOMEN AND PELVIS WITHOUT CONTRAST TECHNIQUE: Multidetector CT imaging of the chest,  abdomen and pelvis was performed following the standard protocol without IV contrast. RADIATION DOSE REDUCTION: This exam was performed according to the departmental dose-optimization program which includes automated exposure control, adjustment of the mA and/or kV according to patient size and/or use of iterative reconstruction technique. COMPARISON:  02/12/2021 FINDINGS: CT CHEST FINDINGS Cardiovascular: Hemodialysis catheter noted with tip projecting over the cavoatrial junction, recently placed on 09/10/2022. Miniscule amount of gas along the lower platysma muscle on the right is not unexpected in this setting, and no drainable abscess or similar complicating feature is observed. Prior CABG. Coronary, aortic arch, and branch vessel atherosclerotic vascular disease. Moderate cardiomegaly. Low-density blood pool compatible with anemia. Mediastinum/Nodes: Unremarkable Lungs/Pleura: Small bilateral pleural effusions with associated passive atelectasis. Scarring at the right lung apex. Patchy ground-glass opacities favoring the upper lobes, nonspecific for edema versus atypical/viral  pneumonia although I favor the former. Mild paraseptal emphysema. Musculoskeletal: Mild thoracic spondylosis. CT ABDOMEN PELVIS FINDINGS Hepatobiliary: Cholecystectomy.  Otherwise unremarkable. Pancreas: Stable punctate calcification along the pancreatic head on image 69 series 2, likely postinflammatory. No acute pancreatic findings. Spleen: Unremarkable Adrenals/Urinary Tract: Both adrenal glands appear normal. Stable benign bilateral renal cysts warranting no further workup. Bilateral renal atrophy. Stomach/Bowel: Unremarkable Vascular/Lymphatic: Atherosclerosis is present, including aortoiliac atherosclerotic disease. There is notable atheromatous plaque proximally in the celiac trunk and SMA, patency not a stab list on today's noncontrast exam. Infrarenal abdominal aortic aneurysm 3.1 cm in anterior-posterior dimension on image 75  series 2, stable. Reproductive: Unremarkable Other: Small amount of ascites primarily in the vicinity of the peritoneal dialysis catheter. Trace retroperitoneal edema bilaterally along with bilateral flank edema in the subcutaneous tissues. Small amount of subcutaneous edema along the inferior margin of the pannus. Musculoskeletal: Mild grade 1 degenerative anterolisthesis at L4-5 with bilateral degenerative facet arthropathy at this level. Small umbilical hernia contains adipose tissue. IMPRESSION: 1. Small bilateral pleural effusions with associated passive atelectasis. 2. Patchy ground-glass opacities favoring the upper lobes, nonspecific for edema versus atypical/viral pneumonia although I favor the former. 3. Moderate cardiomegaly. Low-density blood pool compatible with anemia. 4. Small amount of ascites primarily in the vicinity of the peritoneal dialysis catheter. 5. Infrarenal abdominal aortic aneurysm 3.1 cm in anterior-posterior dimension, stable. Recommend follow up ultrasound every 3 years. 6. Bilateral renal atrophy. 7. Small umbilical hernia contains adipose tissue. 8. Aortic atherosclerosis. 9. Emphysema. 10. Recently placed hemodialysis catheter appear satisfactorily positioned. Aortic Atherosclerosis (ICD10-I70.0) and Emphysema (ICD10-J43.9). Electronically Signed   By: Gaylyn Rong M.D.   On: 09/13/2022 17:08   Physical Examination: Vitals:   09/13/22 2100 09/13/22 2200 09/13/22 2300 09/14/22 0000  BP: 134/87 (!) 141/81 128/67 125/72  Pulse: 91 89 89 89  Temp:      Resp: 18 15 14 13   SpO2: 95% 93% 90% 92%  TempSrc:       Physical Exam Vitals and nursing note reviewed.  Constitutional:      General: He is not in acute distress.    Appearance: He is not ill-appearing, toxic-appearing or diaphoretic.  HENT:     Head: Normocephalic and atraumatic.     Right Ear: Hearing and external ear normal.     Left Ear: Hearing and external ear normal.     Nose: Nose normal. No nasal  deformity.     Mouth/Throat:     Lips: Pink.     Mouth: Mucous membranes are moist.     Tongue: No lesions.     Pharynx: Oropharynx is clear.  Eyes:     Extraocular Movements: Extraocular movements intact.  Cardiovascular:     Rate and Rhythm: Normal rate and regular rhythm.     Pulses: Normal pulses.     Heart sounds: Normal heart sounds.  Pulmonary:     Effort: Pulmonary effort is normal.     Breath sounds: Rales present.  Abdominal:     General: Bowel sounds are normal. There is distension.     Palpations: Abdomen is soft. There is no mass.     Tenderness: There is abdominal tenderness. There is no guarding.     Hernia: No hernia is present.  Musculoskeletal:     Right lower leg: Edema present.     Left lower leg: Edema present.  Skin:    General: Skin is warm.  Neurological:     General: No focal deficit present.  Mental Status: He is alert and oriented to person, place, and time.     Cranial Nerves: Cranial nerves 2-12 are intact.     Motor: Motor function is intact.  Psychiatric:        Attention and Perception: Attention normal.        Mood and Affect: Mood normal.        Speech: Speech normal.        Behavior: Behavior normal. Behavior is cooperative.        Cognition and Memory: Cognition normal.     Assessment and Plan: Acute on chronic combined systolic (congestive) and diastolic (congestive) heart failure Suspect acute on chronic CHF.  Volume overload from ESRD and combined CHF as well contributing to pleural effusion.  Strict I/O. Weights and pulse ox when needed. Cont amiodarone, apixaban, atorvastatin.  Essential hypertension Vitals:   09/13/22 1426 09/13/22 1815 09/13/22 1900 09/13/22 2000  BP: 130/71 127/69 133/76 138/80   09/13/22 2030 09/13/22 2100 09/13/22 2200 09/13/22 2300  BP: 137/83 134/87 (!) 141/81 128/67   09/14/22 0000  BP: 125/72  Metoprolol dose decreased and imdur help to allow for HD and prevent hypotension  in am./   Anemia of  renal disease    Latest Ref Rng & Units 09/13/2022    2:29 PM 09/06/2022   12:00 AM 07/17/2022    4:58 AM  CBC  WBC 4.0 - 10.5 K/uL 6.8  6.6     5.5   Hemoglobin 13.0 - 17.0 g/dL 8.6  9.3     9.6   Hematocrit 39.0 - 52.0 % 28.2   30.1   Platelets 150 - 400 K/uL 143   121      This result is from an external source.  Type/screen IV PPI.   Coronary artery disease Stable CAD.  Continue eliquis and statin.  Continue amiodarone.    Paroxysmal atrial fibrillation EKG shows sinus rhythm, TWI in lateral leads and  RAD. We will continue amiodarone/ metoprolol / Imdur / eliquis.      Hypothyroidism Continue levothyroxine @ 75 mcg.    Bilateral leg edema 2/2 to volume overload for ESRD, hypoalbuminemia. Nephrology consulted for Plan for HD in am per EDMD. Pt received lasix in ed and has had about 400 ml output since then.       Acid reflux IV PPI. Aspiration precaution.    DVT prophylaxis:  Eliquis.    Code Status:  Full code.      09/13/2022    2:28 PM  Advanced Directives  Does Patient Have a Medical Advance Directive? No    Family Communication:  None.  Emergency Contact: Contact Information     Name Relation Home Work Mobile   Vitrano,Pamela Pincus Sanes [phone]  [phone]   Esaias, Cleavenger   [phone]   Divonte, Senger   [phone]       Disposition Plan:  TBD.   Consults: Nephrology.  Admission status: Inpatient.   Unit / Expected LOS: Telemetry / 2 days.   Gertha Calkin MD [facility]  6 PM- 2 AM. [phone]( Pager ) For questions regarding this patient please use [email or web address] to contact the current [facility] MD  You may also call [phone] to contact current Assigned [name] Attending/Consulting MD for this patient.

## 2022-09-13 NOTE — Chronic Care Management (AMB) (Signed)
   09/13/2022  Pascal Dey [facility] 1946-03-03 [id number]   Reason for Encounter: Patient is not currently enrolled in the CCM program. CCM status changed to previously enrolled.   Katha Cabal RN Care Manager/Chronic Care Management [phone]

## 2022-09-13 NOTE — ED Provider Notes (Signed)
[facility] Provider Note    Event Date/Time   First MD Initiated Contact with Patient 09/13/22 1605     (approximate)   History   Chief Complaint: Leg Swelling   HPI  Ronald [name] is a 77 y.o. male with a history of CAD diabetes COPD hypertension ESRD who comes to the ED complaining of worsening bilateral leg swelling for the past 4 or 5 days.  Associated with dyspnea on exertion and PND.  8 pound weight gain over this time.  Has tried increasing Lasix dosing without improvement.  Patient was started on peritoneal dialysis for renal failure about a week ago.  However, he was unable to tolerate it due to abdominal pain.  He had permacath placement and is planned to start hemodialysis.  Patient also reports that he is having difficulty having bowel movements.  He is passing gas, but has a lot of lower abdominal pain and pressure.  With this he has a loss of appetite     Physical Exam   Triage Vital Signs: ED Triage Vitals  Enc Vitals Group     BP 09/13/22 1426 130/71     Pulse Rate 09/13/22 1426 90     Resp 09/13/22 1426 18     Temp 09/13/22 1426 98.4 F (36.9 C)     Temp Source 09/13/22 1426 Oral     SpO2 09/13/22 1426 100 %     Weight --      Height --      Head Circumference --      Peak Flow --      Pain Score 09/13/22 1427 0     Pain Loc --      Pain Edu? --      Excl. in GC? --     Most recent vital signs: Vitals:   09/13/22 1426  BP: 130/71  Pulse: 90  Resp: 18  Temp: 98.4 F (36.9 C)  SpO2: 100%    General: Awake, no distress.  CV:  Good peripheral perfusion.  Regular rate and rhythm Resp:  Normal effort.  Clear to auscultation bilaterally Abd:  Moderately distended.  Diffuse lower abdominal tenderness without peritonitis.  Abdomen is not tense Other:  2+ pitting edema bilateral lower extremities, symmetric.   ED Results / Procedures / Treatments   Labs (all labs ordered are listed, but only abnormal results are  displayed) Labs Reviewed  COMPREHENSIVE METABOLIC PANEL - Abnormal; Notable for the following components:      Result Value   Glucose, Bld 129 (*)    BUN 90 (*)    Creatinine, Ser 5.02 (*)    Calcium 8.8 (*)    Total Protein 5.8 (*)    Albumin 2.8 (*)    AST 53 (*)    ALT 46 (*)    Alkaline Phosphatase 140 (*)    Total Bilirubin 1.3 (*)    GFR, Estimated 11 (*)    All other components within normal limits  CBC - Abnormal; Notable for the following components:   RBC 3.22 (*)    Hemoglobin 8.6 (*)    HCT 28.2 (*)    RDW 18.4 (*)    Platelets 143 (*)    All other components within normal limits  URINALYSIS, ROUTINE W REFLEX MICROSCOPIC - Abnormal; Notable for the following components:   Color, Urine YELLOW (*)    APPearance CLEAR (*)    All other components within normal limits  BRAIN NATRIURETIC PEPTIDE - Abnormal;  Notable for the following components:   B Natriuretic Peptide 1,700.3 (*)    All other components within normal limits  TROPONIN I (HIGH SENSITIVITY) - Abnormal; Notable for the following components:   Troponin I (High Sensitivity) 18 (*)    All other components within normal limits  LIPASE, BLOOD     EKG Interpreted by me Sinus rhythm, rate of 90.  This.  There are T wave inversions in the inferior and lateral leads, old compared to previous EKG on July 19, 2022.  RADIOLOGY CT chest/abd/pelvis interpreted by me, no signs of bowel obstruction, free air, or lobar consolidation. Radiology report reviewed   PROCEDURES:  Procedures   MEDICATIONS ORDERED IN ED: Medications  furosemide (LASIX) injection 80 mg (80 mg Intravenous Given 09/13/22 1715)     IMPRESSION / MDM / ASSESSMENT AND PLAN / ED COURSE  I reviewed the triage vital signs and the nursing notes.  DDx: Volume overload, non-STEMI, heart failure, electrolyte abnormality, anemia, hypoalbuminemia, bowel obstruction, bowel perforation, intra-abdominal abscess  Patient's presentation is most  consistent with acute presentation with potential threat to life or bodily function.  Patient presents with gradually worsening lower extremity edema and respiratory symptoms.  Not hypoxic on room air, but symptoms have reached a severity where he is no longer able to manage his ADLs at home with available resources and support.  Will give IV Lasix for diuresis.  Will need to hospitalize for further management which may include hemodialysis.   Clinical Course as of 09/13/22 1833  Mon Sep 13, 2022  1821 CT CAP overall unremarkable except for mild signs of volume overload [PS]    Clinical Course User Index [PS] Sharman Cheek, MD    ----------------------------------------- 6:33 PM on 09/13/2022 ----------------------------------------- Case d/w hospitalist   FINAL CLINICAL IMPRESSION(S) / ED DIAGNOSES   Final diagnoses:  DOE (dyspnea on exertion)  Peripheral edema  ESRD (end stage renal disease)     Rx / DC Orders   ED Discharge Orders     None        Note:  This document was prepared using Dragon voice recognition software and may include unintentional dictation errors.   Sharman Cheek, MD 09/13/22 [phone]

## 2022-09-14 ENCOUNTER — Encounter: Payer: Self-pay | Admitting: Internal Medicine

## 2022-09-14 DIAGNOSIS — I25118 Atherosclerotic heart disease of native coronary artery with other forms of angina pectoris: Secondary | ICD-10-CM

## 2022-09-14 DIAGNOSIS — I5043 Acute on chronic combined systolic (congestive) and diastolic (congestive) heart failure: Secondary | ICD-10-CM | POA: Diagnosis not present

## 2022-09-14 DIAGNOSIS — D631 Anemia in chronic kidney disease: Secondary | ICD-10-CM

## 2022-09-14 DIAGNOSIS — N189 Chronic kidney disease, unspecified: Secondary | ICD-10-CM

## 2022-09-14 DIAGNOSIS — E785 Hyperlipidemia, unspecified: Secondary | ICD-10-CM | POA: Diagnosis present

## 2022-09-14 DIAGNOSIS — R600 Localized edema: Secondary | ICD-10-CM | POA: Diagnosis not present

## 2022-09-14 DIAGNOSIS — Z951 Presence of aortocoronary bypass graft: Secondary | ICD-10-CM | POA: Diagnosis not present

## 2022-09-14 DIAGNOSIS — I252 Old myocardial infarction: Secondary | ICD-10-CM | POA: Diagnosis not present

## 2022-09-14 DIAGNOSIS — I10 Essential (primary) hypertension: Secondary | ICD-10-CM

## 2022-09-14 DIAGNOSIS — E8770 Fluid overload, unspecified: Secondary | ICD-10-CM | POA: Diagnosis not present

## 2022-09-14 DIAGNOSIS — I2510 Atherosclerotic heart disease of native coronary artery without angina pectoris: Secondary | ICD-10-CM | POA: Diagnosis present

## 2022-09-14 DIAGNOSIS — I480 Paroxysmal atrial fibrillation: Secondary | ICD-10-CM | POA: Diagnosis not present

## 2022-09-14 DIAGNOSIS — Z79899 Other long term (current) drug therapy: Secondary | ICD-10-CM | POA: Diagnosis not present

## 2022-09-14 DIAGNOSIS — Z961 Presence of intraocular lens: Secondary | ICD-10-CM | POA: Diagnosis present

## 2022-09-14 DIAGNOSIS — Z7901 Long term (current) use of anticoagulants: Secondary | ICD-10-CM | POA: Diagnosis not present

## 2022-09-14 DIAGNOSIS — K219 Gastro-esophageal reflux disease without esophagitis: Secondary | ICD-10-CM | POA: Diagnosis not present

## 2022-09-14 DIAGNOSIS — N186 End stage renal disease: Secondary | ICD-10-CM | POA: Diagnosis not present

## 2022-09-14 DIAGNOSIS — E039 Hypothyroidism, unspecified: Secondary | ICD-10-CM

## 2022-09-14 DIAGNOSIS — Z955 Presence of coronary angioplasty implant and graft: Secondary | ICD-10-CM | POA: Diagnosis not present

## 2022-09-14 DIAGNOSIS — E1122 Type 2 diabetes mellitus with diabetic chronic kidney disease: Secondary | ICD-10-CM | POA: Diagnosis present

## 2022-09-14 DIAGNOSIS — I714 Abdominal aortic aneurysm, without rupture, unspecified: Secondary | ICD-10-CM | POA: Diagnosis present

## 2022-09-14 DIAGNOSIS — Z888 Allergy status to other drugs, medicaments and biological substances status: Secondary | ICD-10-CM | POA: Diagnosis not present

## 2022-09-14 DIAGNOSIS — N2581 Secondary hyperparathyroidism of renal origin: Secondary | ICD-10-CM | POA: Diagnosis not present

## 2022-09-14 DIAGNOSIS — J439 Emphysema, unspecified: Secondary | ICD-10-CM | POA: Diagnosis present

## 2022-09-14 DIAGNOSIS — I132 Hypertensive heart and chronic kidney disease with heart failure and with stage 5 chronic kidney disease, or end stage renal disease: Secondary | ICD-10-CM | POA: Diagnosis present

## 2022-09-14 DIAGNOSIS — Z885 Allergy status to narcotic agent status: Secondary | ICD-10-CM | POA: Diagnosis not present

## 2022-09-14 DIAGNOSIS — Z79890 Hormone replacement therapy (postmenopausal): Secondary | ICD-10-CM | POA: Diagnosis not present

## 2022-09-14 DIAGNOSIS — Z882 Allergy status to sulfonamides status: Secondary | ICD-10-CM | POA: Diagnosis not present

## 2022-09-14 DIAGNOSIS — D696 Thrombocytopenia, unspecified: Secondary | ICD-10-CM | POA: Diagnosis present

## 2022-09-14 DIAGNOSIS — Z992 Dependence on renal dialysis: Secondary | ICD-10-CM | POA: Diagnosis not present

## 2022-09-14 LAB — COMPREHENSIVE METABOLIC PANEL
ALT: 43 U/L (ref 0–44)
AST: 51 U/L — ABNORMAL HIGH (ref 15–41)
Albumin: 2.9 g/dL — ABNORMAL LOW (ref 3.5–5.0)
Alkaline Phosphatase: 116 U/L (ref 38–126)
Anion gap: 11 (ref 5–15)
BUN: 93 mg/dL — ABNORMAL HIGH (ref 8–23)
CO2: 26 mmol/L (ref 22–32)
Calcium: 8.7 mg/dL — ABNORMAL LOW (ref 8.9–10.3)
Chloride: 109 mmol/L (ref 98–111)
Creatinine, Ser: 4.82 mg/dL — ABNORMAL HIGH (ref 0.61–1.24)
GFR, Estimated: 12 mL/min — ABNORMAL LOW (ref >60)
Glucose, Bld: 110 mg/dL — ABNORMAL HIGH (ref 70–99)
Potassium: 3.6 mmol/L (ref 3.5–5.1)
Sodium: 146 mmol/L — ABNORMAL HIGH (ref 135–145)
Total Bilirubin: 1.6 mg/dL — ABNORMAL HIGH (ref 0.3–1.2)
Total Protein: 5.5 g/dL — ABNORMAL LOW (ref 6.5–8.1)

## 2022-09-14 LAB — CBC
HCT: 27.1 % — ABNORMAL LOW (ref 39.0–52.0)
Hemoglobin: 8.3 g/dL — ABNORMAL LOW (ref 13.0–17.0)
MCH: 27 pg (ref 26.0–34.0)
MCHC: 30.6 g/dL (ref 30.0–36.0)
MCV: 88.3 fL (ref 80.0–100.0)
Platelets: 131 10*3/uL — ABNORMAL LOW (ref 150–400)
RBC: 3.07 MIL/uL — ABNORMAL LOW (ref 4.22–5.81)
RDW: 18.6 % — ABNORMAL HIGH (ref 11.5–15.5)
WBC: 6.1 10*3/uL (ref 4.0–10.5)
nRBC: 0 % (ref 0.0–0.2)

## 2022-09-14 LAB — HEPATITIS B SURFACE ANTIGEN: Hepatitis B Surface Ag: NONREACTIVE

## 2022-09-14 MED ORDER — ANTICOAGULANT SODIUM CITRATE 4% (200MG/5ML) IV SOLN: 5.0000 mL | Status: DC | PRN

## 2022-09-14 MED ORDER — LIDOCAINE HCL (PF) 1 % IJ SOLN: 5.0000 mL | INTRAMUSCULAR | Status: DC | PRN

## 2022-09-14 MED ORDER — PENTAFLUOROPROP-TETRAFLUOROETH EX AERO: 1.0000 | INHALATION_SPRAY | CUTANEOUS | Status: DC | PRN

## 2022-09-14 MED ORDER — HEPARIN SODIUM (PORCINE) 1000 UNIT/ML IJ SOLN
INTRAMUSCULAR | Status: AC
  Filled 2022-09-14: qty 10

## 2022-09-14 MED ORDER — LIDOCAINE-PRILOCAINE 2.5-2.5 % EX CREA: 1.0000 | TOPICAL_CREAM | CUTANEOUS | Status: DC | PRN

## 2022-09-14 MED ORDER — ALTEPLASE 2 MG IJ SOLR: 2.0000 mg | Freq: Once | INTRAMUSCULAR | Status: DC | PRN

## 2022-09-14 MED ORDER — CHLORHEXIDINE GLUCONATE CLOTH 2 % EX PADS
6.0000 | MEDICATED_PAD | Freq: Every day | CUTANEOUS | Status: DC
  Administered 2022-09-14 – 2022-09-16 (×3): 6 via TOPICAL
  Filled 2022-09-14: qty 6

## 2022-09-14 MED ORDER — HEPARIN SODIUM (PORCINE) 1000 UNIT/ML DIALYSIS
1000.0000 [IU] | INTRAMUSCULAR | Status: DC | PRN
  Administered 2022-09-15: 1000 [IU]

## 2022-09-14 MED ORDER — METOPROLOL TARTRATE 50 MG PO TABS
50.0000 mg | ORAL_TABLET | Freq: Two times a day (BID) | ORAL | Status: DC
  Administered 2022-09-14 – 2022-09-15 (×3): 50 mg via ORAL
  Filled 2022-09-14 (×3): qty 1

## 2022-09-14 NOTE — Assessment & Plan Note (Addendum)
Stable CAD.  Continue eliquis and statin.  Continue amiodarone.

## 2022-09-14 NOTE — ED Notes (Signed)
Pt. Breakfast set up. Plate prepared for eating. Pt. Denies any further need. NAD.

## 2022-09-14 NOTE — Assessment & Plan Note (Addendum)
2/2 to volume overload for ESRD, hypoalbuminemia. Nephrology consulted for Plan for HD in am per EDMD. Pt received lasix in ed and has had about 400 ml output since then.

## 2022-09-14 NOTE — Assessment & Plan Note (Addendum)
Suspect acute on chronic CHF.  Volume overload from ESRD and combined CHF as well contributing to pleural effusion.  Strict I/O. Weights and pulse ox when needed. Cont amiodarone, apixaban, atorvastatin.

## 2022-09-14 NOTE — Assessment & Plan Note (Signed)
EKG shows sinus rhythm, TWI in lateral leads and  RAD. We will continue amiodarone/ metoprolol / Imdur / eliquis.

## 2022-09-14 NOTE — Progress Notes (Addendum)
[facility]  ROUNDING NOTE   Subjective:   Ronald [name] is a 77 year old male with past medical conditions including CAD, type 2 diabetes, emphysema, GERD, hyperlipidemia, hypertension, AAA, and end-stage renal disease on hemodialysis.  Patient presents to the emergency department with complaints of leg swelling and has been admitted under observation for Peripheral edema [R60.0] DOE (dyspnea on exertion) [R06.09] 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] Bilateral leg edema [R60.0]  Patient is known to our practice and receives outpatient dialysis treatments at [facility]  Patient was recently started on peritoneal dialysis however was unable to tolerate treatments.  PermCath was placed and patient was to start hemodialysis this past Saturday.  Patient states he has progressively not felt well since the weekend.  Lower extremity edema remains.  Currently denies shortness of breath, room air.  Patient is seen and evaluated during dialysis   HEMODIALYSIS FLOWSHEET:  Blood Flow Rate (mL/min): 250 mL/min Arterial Pressure (mmHg): -70 mmHg Venous Pressure (mmHg): 110 mmHg TMP (mmHg): 21 mmHg Ultrafiltration Rate (mL/min): 480 mL/min Dialysate Flow Rate (mL/min): 300 ml/min   Labs on ED arrival significant for glucose 129, BUN 90, creatinine 5.02 with GFR 11, albumin 2.8, BNP greater than 1700, troponin 18, and hemoglobin 8.6.  CT abdomen pelvis shows small bilateral pleural effusions, patchy groundglass opacities in upper lobes, small volume ascites, and bilateral renal atrophy.  We have been consulted to provide hemodialysis during this admission.   Objective:  Vital signs in last 24 hours:  Temp:  [97.6 F (36.4 C)-98.4 F (36.9 C)] 97.8 F (36.6 C) (04/16 1227) Pulse Rate:  [88-95] 93 (04/16 1227) Resp:  [12-20] 17 (04/16 1227) BP: (125-155)/(67-92) 150/92 (04/16 1227) SpO2:  [90 %-100 %] 97 % (04/16 1227) Weight:  [106.6 kg-107.3 kg] 106.6 kg (04/16  1247)  Weight change:  Filed Weights   09/14/22 1008 09/14/22 1247  Weight: 107.3 kg 106.6 kg    Intake/Output: I/O last 3 completed shifts: In: -  Out: 850 [Urine:850]   Intake/Output this shift:  Total I/O In: -  Out: 500 [Other:500]  Physical Exam: General: NAD  Head: Normocephalic, atraumatic. Moist oral mucosal membranes  Eyes: Anicteric  Lungs:  Diminished in bases, normal effort, room air  Heart: Regular rate and rhythm  Abdomen:  Soft, nontender  Extremities: 3+ peripheral edema.  Neurologic: Alert and oriented, moving all four extremities  Skin: No lesions  Access: Right chest PermCath    Basic Metabolic Panel: Recent Labs  Lab 09/13/22 1429 09/14/22 0451  NA 144 146*  K 3.8 3.6  CL 108 109  CO2 25 26  GLUCOSE 129* 110*  BUN 90* 93*  CREATININE 5.02* 4.82*  CALCIUM 8.8* 8.7*    Liver Function Tests: Recent Labs  Lab 09/13/22 1429 09/14/22 0451  AST 53* 51*  ALT 46* 43  ALKPHOS 140* 116  BILITOT 1.3* 1.6*  PROT 5.8* 5.5*  ALBUMIN 2.8* 2.9*   Recent Labs  Lab 09/13/22 1429  LIPASE 42   No results for input(s): "AMMONIA" in the last 168 hours.  CBC: Recent Labs  Lab 09/13/22 1429 09/14/22 0451  WBC 6.8 6.1  HGB 8.6* 8.3*  HCT 28.2* 27.1*  MCV 87.6 88.3  PLT 143* 131*    Cardiac Enzymes: No results for input(s): "CKTOTAL", "CKMB", "CKMBINDEX", "TROPONINI" in the last 168 hours.  BNP: Invalid input(s): "POCBNP"  CBG: Recent Labs  Lab 09/10/22 1339  GLUCAP 101*    Microbiology: Results for orders placed or performed during the  hospital encounter of 05/28/22  Resp panel by RT-PCR (RSV, Flu A&B, Covid) Anterior Nasal Swab     Status: None   Collection Time: 05/28/22  8:19 PM   Specimen: Anterior Nasal Swab  Result Value Ref Range Status   SARS Coronavirus 2 by RT PCR NEGATIVE NEGATIVE Final    Comment: (NOTE) SARS-CoV-2 target nucleic acids are NOT DETECTED.  The SARS-CoV-2 RNA is generally detectable in upper  respiratory specimens during the acute phase of infection. The lowest concentration of SARS-CoV-2 viral copies this assay can detect is 138 copies/mL. A negative result does not preclude SARS-Cov-2 infection and should not be used as the sole basis for treatment or other patient management decisions. A negative result may occur with  improper specimen collection/handling, submission of specimen other than nasopharyngeal swab, presence of viral mutation(s) within the areas targeted by this assay, and inadequate number of viral copies(<138 copies/mL). A negative result must be combined with clinical observations, patient history, and epidemiological information. The expected result is Negative.  Fact Sheet for Patients:  BloggerCourse.com  Fact Sheet for Healthcare Providers:  SeriousBroker.it  This test is no t yet approved or cleared by the [place] FDA and  has been authorized for detection and/or diagnosis of SARS-CoV-2 by FDA under an Emergency Use Authorization (EUA). This EUA will remain  in effect (meaning this test can be used) for the duration of the COVID-19 declaration under Section 564(b)(1) of the Act, 21 U.S.C.section 360bbb-3(b)(1), unless the authorization is terminated  or revoked sooner.       Influenza A by PCR NEGATIVE NEGATIVE Final   Influenza B by PCR NEGATIVE NEGATIVE Final    Comment: (NOTE) The Xpert Xpress SARS-CoV-2/FLU/RSV plus assay is intended as an aid in the diagnosis of influenza from Nasopharyngeal swab specimens and should not be used as a sole basis for treatment. Nasal washings and aspirates are unacceptable for Xpert Xpress SARS-CoV-2/FLU/RSV testing.  Fact Sheet for Patients: BloggerCourse.com  Fact Sheet for Healthcare Providers: SeriousBroker.it  This test is not yet approved or cleared by the [place] FDA and has been  authorized for detection and/or diagnosis of SARS-CoV-2 by FDA under an Emergency Use Authorization (EUA). This EUA will remain in effect (meaning this test can be used) for the duration of the COVID-19 declaration under Section 564(b)(1) of the Act, 21 U.S.C. section 360bbb-3(b)(1), unless the authorization is terminated or revoked.     Resp Syncytial Virus by PCR NEGATIVE NEGATIVE Final    Comment: (NOTE) Fact Sheet for Patients: BloggerCourse.com  Fact Sheet for Healthcare Providers: SeriousBroker.it  This test is not yet approved or cleared by the [place] FDA and has been authorized for detection and/or diagnosis of SARS-CoV-2 by FDA under an Emergency Use Authorization (EUA). This EUA will remain in effect (meaning this test can be used) for the duration of the COVID-19 declaration under Section 564(b)(1) of the Act, 21 U.S.C. section 360bbb-3(b)(1), unless the authorization is terminated or revoked.  Performed at [facility], [address] [place], [place] [address]     Coagulation Studies: No results for input(s): "LABPROT", "INR" in the last 72 hours.  Urinalysis: Recent Labs    09/13/22 1716  COLORURINE YELLOW*  LABSPEC 1.010  PHURINE 5.0  GLUCOSEU NEGATIVE  HGBUR NEGATIVE  BILIRUBINUR NEGATIVE  KETONESUR NEGATIVE  PROTEINUR NEGATIVE  NITRITE NEGATIVE  LEUKOCYTESUR NEGATIVE      Imaging: CT CHEST ABDOMEN PELVIS WO CONTRAST  Result Date: 09/13/2022 CLINICAL DATA:  Shortness of  breath, fluid overload, leg swelling, abdominal distension. Recent peritoneal dialysis, anticipation of starting hemodialysis tomorrow. Lower abdominal pain. EXAM: CT CHEST, ABDOMEN AND PELVIS WITHOUT CONTRAST TECHNIQUE: Multidetector CT imaging of the chest, abdomen and pelvis was performed following the standard protocol without IV contrast. RADIATION DOSE REDUCTION: This exam was performed according to the  departmental dose-optimization program which includes automated exposure control, adjustment of the mA and/or kV according to patient size and/or use of iterative reconstruction technique. COMPARISON:  02/12/2021 FINDINGS: CT CHEST FINDINGS Cardiovascular: Hemodialysis catheter noted with tip projecting over the cavoatrial junction, recently placed on 09/10/2022. Miniscule amount of gas along the lower platysma muscle on the right is not unexpected in this setting, and no drainable abscess or similar complicating feature is observed. Prior CABG. Coronary, aortic arch, and branch vessel atherosclerotic vascular disease. Moderate cardiomegaly. Low-density blood pool compatible with anemia. Mediastinum/Nodes: Unremarkable Lungs/Pleura: Small bilateral pleural effusions with associated passive atelectasis. Scarring at the right lung apex. Patchy ground-glass opacities favoring the upper lobes, nonspecific for edema versus atypical/viral pneumonia although I favor the former. Mild paraseptal emphysema. Musculoskeletal: Mild thoracic spondylosis. CT ABDOMEN PELVIS FINDINGS Hepatobiliary: Cholecystectomy.  Otherwise unremarkable. Pancreas: Stable punctate calcification along the pancreatic head on image 69 series 2, likely postinflammatory. No acute pancreatic findings. Spleen: Unremarkable Adrenals/Urinary Tract: Both adrenal glands appear normal. Stable benign bilateral renal cysts warranting no further workup. Bilateral renal atrophy. Stomach/Bowel: Unremarkable Vascular/Lymphatic: Atherosclerosis is present, including aortoiliac atherosclerotic disease. There is notable atheromatous plaque proximally in the celiac trunk and SMA, patency not a stab list on today's noncontrast exam. Infrarenal abdominal aortic aneurysm 3.1 cm in anterior-posterior dimension on image 75 series 2, stable. Reproductive: Unremarkable Other: Small amount of ascites primarily in the vicinity of the peritoneal dialysis catheter. Trace  retroperitoneal edema bilaterally along with bilateral flank edema in the subcutaneous tissues. Small amount of subcutaneous edema along the inferior margin of the pannus. Musculoskeletal: Mild grade 1 degenerative anterolisthesis at L4-5 with bilateral degenerative facet arthropathy at this level. Small umbilical hernia contains adipose tissue. IMPRESSION: 1. Small bilateral pleural effusions with associated passive atelectasis. 2. Patchy ground-glass opacities favoring the upper lobes, nonspecific for edema versus atypical/viral pneumonia although I favor the former. 3. Moderate cardiomegaly. Low-density blood pool compatible with anemia. 4. Small amount of ascites primarily in the vicinity of the peritoneal dialysis catheter. 5. Infrarenal abdominal aortic aneurysm 3.1 cm in anterior-posterior dimension, stable. Recommend follow up ultrasound every 3 years. 6. Bilateral renal atrophy. 7. Small umbilical hernia contains adipose tissue. 8. Aortic atherosclerosis. 9. Emphysema. 10. Recently placed hemodialysis catheter appear satisfactorily positioned. Aortic Atherosclerosis (ICD10-I70.0) and Emphysema (ICD10-J43.9). Electronically Signed   By: Gaylyn Rong M.D.   On: 09/13/2022 17:08     Medications:    sodium chloride     anticoagulant sodium citrate      acetaminophen  500 mg Oral QHS   allopurinol  100 mg Oral Daily   amiodarone  200 mg Oral Daily   apixaban  5 mg Oral BID   atorvastatin  40 mg Oral QHS   Chlorhexidine Gluconate Cloth  6 each Topical Q0600   levothyroxine  75 mcg Oral Q0600   metoprolol tartrate  50 mg Oral BID   pantoprazole  40 mg Oral BID   polyethylene glycol  17 g Oral QHS   sodium bicarbonate  650 mg Oral BID   sodium chloride flush  3 mL Intravenous Q12H   sodium chloride flush  3 mL Intravenous Q12H   sodium  chloride, alteplase, anticoagulant sodium citrate, heparin, lidocaine (PF), lidocaine-prilocaine, morphine injection, pentafluoroprop-tetrafluoroeth,  sodium chloride flush  Assessment/ Plan:  Ronald [name] is a 77 y.o.  male with past medical conditions including CAD, type 2 diabetes, emphysema, GERD, hyperlipidemia, hypertension, AAA, and end-stage renal disease on hemodialysis.  Patient presents to the emergency department with complaints of leg swelling and has been admitted under observation for Peripheral edema [R60.0] DOE (dyspnea on exertion) [R06.09] 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] Bilateral leg edema [R60.0]   Fluid volume overload with end-stage renal disease on hemodialysis.  Patient recently transition from peritoneal to hemodialysis due to intolerance.  Lower extremity edema noted.  Patient received 2-1/2-hour treatment today, UF 0.5 L as tolerated.  Will plan to dialyze patient for additional fluid removal tomorrow.    2. Anemia of chronic kidney disease Lab Results  Component Value Date   HGB 8.3 (L) 09/14/2022    Hemoglobin below desired range.  Will consider EPO with dialysis treatments.  3. Secondary Hyperparathyroidism: with outpatient labs:  phosphorus 3.9, calcium 8.9 on 08/26/22.   Lab Results  Component Value Date   CALCIUM 8.7 (L) 09/14/2022   PHOS 4.2 05/30/2022    Bone minerals within acceptable range.  Will monitor for now.  4.  Hypertension with chronic kidney disease. Home regimen includes amiodarone, furosemide, isosorbide, and metoprolol.  Currently receiving amiodarone and metoprolol.  Patient has received 1 dose IV furosemide 80 mg.    LOS: 0   4/16/20241:00 PM

## 2022-09-14 NOTE — Hospital Course (Signed)
Ronald [name] is an 77 y.o. male with past medical history of congestive heart failure, hypertension, end-stage renal disease, coronary artery disease, hypothyroidism, paroxysmal atrial fibrillation presented to hospital with worsening bilateral lower extremity edema and plans for initiating hemodialysis.  He also complained of shortness of breath.  Of note patient has end-stage renal disease and was started on peritoneal dialysis which he could not tolerate due to abdominal pain and had permacath placed in on September 10, 2022.[name]  Patient is being followed by Dr. Cherylann Ratel, nephrologist.  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  In February 2024 patient was admitted for chest pain where he received heparin drip but catheterization was deferred due to end-stage renal disease.  Patient is on amiodarone Eliquis for his atrial fibrillation.he also has anemia with baseline hemoglobin of 9, but also intermittent thrombocytopenia that has been chronic.   Assessment and plan. 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  Patient also has volume overload from ESRD.  Continue strict intake and output charting Daily weights.  Continue amiodarone apixaban and Lipitor.  Neurology has been consulted for hemodialysis.  Patient received 1 dose of IV Lasix in the ED  Essential hypertension Metoprolol dose was decreased.  Continue Imdur.  Coronary artery disease Continue Eliquis and statin and amiodarone.  Paroxysmal atrial fibrillation Initial EKG showed sinus rhythm.  Continue amiodarone/ metoprolol / Imdur /  eliquis.  Hypothyroidism Continue Synthroid.    GERD.  Continue PPI

## 2022-09-14 NOTE — Assessment & Plan Note (Signed)
Continue levothyroxine 75 mcg 

## 2022-09-14 NOTE — Progress Notes (Signed)
  Received patient in bed to unit.   Informed consent signed and in chart.    TX duration:2.5hrs       Transported back to floor   Hand-off given to patient's nurse. No c/o and no distress noted   Access used: R HD cath Access issues: none   Total UF removed: 0.5kg Medication(s) given: none Post HD VS: 150/92 Post HD weight: 106.6kg     Lynann Beaver  [facility]

## 2022-09-14 NOTE — Assessment & Plan Note (Signed)
Vitals:   09/13/22 1426 09/13/22 1815 09/13/22 1900 09/13/22 2000  BP: 130/71 127/69 133/76 138/80   09/13/22 2030 09/13/22 2100 09/13/22 2200 09/13/22 2300  BP: 137/83 134/87 (!) 141/81 128/67   09/14/22 0000  BP: 125/72  Metoprolol dose decreased and imdur help to allow for HD and prevent hypotension  in am./

## 2022-09-14 NOTE — Assessment & Plan Note (Signed)
IV PPI. Aspiration precaution.

## 2022-09-14 NOTE — Assessment & Plan Note (Addendum)
    Latest Ref Rng & Units 09/13/2022    2:29 PM 09/06/2022   12:00 AM 07/17/2022    4:58 AM  CBC  WBC 4.0 - 10.5 K/uL 6.8  6.6     5.5   Hemoglobin 13.0 - 17.0 g/dL 8.6  9.3     9.6   Hematocrit 39.0 - 52.0 % 28.2   30.1   Platelets 150 - 400 K/uL 143   121      This result is from an external source.  Type/screen IV PPI.

## 2022-09-14 NOTE — Progress Notes (Signed)
PROGRESS NOTE    Ronald [name] [facility]  ZOX:[id number] DOB: 1945-07-31 DOA: 09/13/2022 PCP: Reubin Milan, MD    Brief Narrative:  Ronald [name] is an 77 y.o. male with past medical history of congestive heart failure, hypertension, end-stage renal disease, coronary artery disease, hypothyroidism, paroxysmal atrial fibrillation presented to hospital with worsening bilateral lower extremity edema and plans for initiating hemodialysis.  He also complained of shortness of breath.  Of note patient has end-stage renal disease and was started on peritoneal dialysis which he could not tolerate due to abdominal pain and had permacath placed in on September 10, 2022.[name]  Patient is being followed by Dr. Cherylann Ratel, nephrologist.  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  In February 2024 patient was admitted for chest pain where he received heparin drip but catheterization was deferred due to end-stage renal disease.  Patient is on amiodarone Eliquis for his atrial fibrillation.he also has anemia with baseline hemoglobin of 9, but also intermittent thrombocytopenia that has been chronic.   Assessment and plan.  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  Patient also has volume overload from ESRD.  Continue strict intake and output charting Daily weights.  Continue amiodarone apixaban and Lipitor.  Neurology has been consulted for hemodialysis.  Seen during hemodialysis.  Patient received 1 dose of IV Lasix in the ED  Essential hypertension Metoprolol dose was decreased.  Continue Imdur.  Coronary artery disease Continue Eliquis and  statin and amiodarone.  Paroxysmal atrial fibrillation Initial EKG showed sinus rhythm.  Continue amiodarone/ metoprolol / Imdur / eliquis.  Hypothyroidism Continue Synthroid.    GERD.  Continue PPI      DVT prophylaxis:  apixaban (ELIQUIS) tablet 5 mg   Code Status:     Code Status: Full Code  Disposition: Home Status is: Observation The patient will require care spanning > 2 midnights and should be moved to inpatient because: Initiation of hemodialysis, will need outpatient hemodialysis set up,   Family Communication: None at bedside  Consultants:  Nephrology  Procedures:  Hemodialysis  Antimicrobials:  None  Anti-infectives (From admission, onward)    None        Subjective: Today, patient was seen and examined at bedside.  Feels okay.  Denies any dizziness lightheadedness shortness of breath nausea vomiting.  Objective: Vitals:   09/14/22 0931 09/14/22 0953 09/14/22 1000 09/14/22 1008  BP: (!) 146/75 (!) 143/87 (!) 147/85   Pulse: 90 90 91   Resp: 20 16 (!) 117   Temp: 97.8 F (36.6 C)     TempSrc: Oral     SpO2: 93% 97% 96%   Weight:    107.3 kg    Intake/Output Summary (Last 24 hours) at 09/14/2022 1025 Last data filed at 09/14/2022 0611 Gross per 24 hour  Intake --  Output 850 ml  Net -850 ml   Filed Weights   09/14/22 1008  Weight: 107.3 kg    Physical Examination: Body mass index is 34.93 kg/m.  General: Obese built, not in obvious distress, elderly male HENT:   No scleral pallor or icterus noted. Oral mucosa is moist.  Right chest wall  hemodialysis catheter in place Chest:  .  Diminished breath sounds bilaterally. No crackles or wheezes.  CVS: S1 &S2 heard. No murmur.  Regular rate and rhythm. Abdomen: Soft, nontender, nondistended.  Peritoneal dialysis catheter in place, nonspecific tenderness bowel sounds are heard.   Extremities: No cyanosis, clubbing with gross or bilateral pitting edema peripheral pulses are palpable. Psych:  Alert, awake and oriented, normal mood CNS:  No cranial nerve deficits.  Power equal in all extremities.   Skin: Warm and dry.  No rashes noted.  Data Reviewed:   CBC: Recent Labs  Lab 09/13/22 1429 09/14/22 0451  WBC 6.8 6.1  HGB 8.6* 8.3*  HCT 28.2* 27.1*  MCV 87.6 88.3  PLT 143* 131*    Basic Metabolic Panel: Recent Labs  Lab 09/13/22 1429 09/14/22 0451  NA 144 146*  K 3.8 3.6  CL 108 109  CO2 25 26  GLUCOSE 129* 110*  BUN 90* 93*  CREATININE 5.02* 4.82*  CALCIUM 8.8* 8.7*    Liver Function Tests: Recent Labs  Lab 09/13/22 1429 09/14/22 0451  AST 53* 51*  ALT 46* 43  ALKPHOS 140* 116  BILITOT 1.3* 1.6*  PROT 5.8* 5.5*  ALBUMIN 2.8* 2.9*     Radiology Studies: CT CHEST ABDOMEN PELVIS WO CONTRAST  Result Date: 09/13/2022 CLINICAL DATA:  Shortness of breath, fluid overload, leg swelling, abdominal distension. Recent peritoneal dialysis, anticipation of starting hemodialysis tomorrow. Lower abdominal pain. EXAM: CT CHEST, ABDOMEN AND PELVIS WITHOUT CONTRAST TECHNIQUE: Multidetector CT imaging of the chest, abdomen and pelvis was performed following the standard protocol without IV contrast. RADIATION DOSE REDUCTION: This exam was performed according to the departmental dose-optimization program which includes automated exposure control, adjustment of the mA and/or kV according to patient size and/or use of iterative reconstruction technique. COMPARISON:  02/12/2021 FINDINGS: CT CHEST FINDINGS Cardiovascular: Hemodialysis catheter noted with tip projecting over the cavoatrial junction, recently placed on 09/10/2022. Miniscule amount of gas along the lower platysma muscle on the right is not unexpected in this setting, and no drainable abscess or similar complicating feature is observed. Prior CABG. Coronary, aortic arch, and branch vessel atherosclerotic vascular disease. Moderate cardiomegaly. Low-density blood pool compatible with anemia. Mediastinum/Nodes:  Unremarkable Lungs/Pleura: Small bilateral pleural effusions with associated passive atelectasis. Scarring at the right lung apex. Patchy ground-glass opacities favoring the upper lobes, nonspecific for edema versus atypical/viral pneumonia although I favor the former. Mild paraseptal emphysema. Musculoskeletal: Mild thoracic spondylosis. CT ABDOMEN PELVIS FINDINGS Hepatobiliary: Cholecystectomy.  Otherwise unremarkable. Pancreas: Stable punctate calcification along the pancreatic head on image 69 series 2, likely postinflammatory. No acute pancreatic findings. Spleen: Unremarkable Adrenals/Urinary Tract: Both adrenal glands appear normal. Stable benign bilateral renal cysts warranting no further workup. Bilateral renal atrophy. Stomach/Bowel: Unremarkable Vascular/Lymphatic: Atherosclerosis is present, including aortoiliac atherosclerotic disease. There is notable atheromatous plaque proximally in the celiac trunk and SMA, patency not a stab list on today's noncontrast exam. Infrarenal abdominal aortic aneurysm 3.1 cm in anterior-posterior dimension on image 75 series 2, stable. Reproductive: Unremarkable Other: Small amount of ascites primarily in the vicinity of the peritoneal dialysis catheter. Trace retroperitoneal edema bilaterally along with bilateral flank edema in the subcutaneous tissues. Small amount of subcutaneous edema along the inferior margin of the pannus. Musculoskeletal: Mild grade 1 degenerative anterolisthesis at L4-5 with bilateral degenerative facet arthropathy at this level. Small umbilical hernia contains adipose tissue. IMPRESSION: 1. Small bilateral pleural effusions with associated passive atelectasis. 2. Patchy ground-glass opacities favoring the upper lobes, nonspecific for edema versus  atypical/viral pneumonia although I favor the former. 3. Moderate cardiomegaly. Low-density blood pool compatible with anemia. 4. Small amount of ascites primarily in the vicinity of the peritoneal  dialysis catheter. 5. Infrarenal abdominal aortic aneurysm 3.1 cm in anterior-posterior dimension, stable. Recommend follow up ultrasound every 3 years. 6. Bilateral renal atrophy. 7. Small umbilical hernia contains adipose tissue. 8. Aortic atherosclerosis. 9. Emphysema. 10. Recently placed hemodialysis catheter appear satisfactorily positioned. Aortic Atherosclerosis (ICD10-I70.0) and Emphysema (ICD10-J43.9). Electronically Signed   By: Gaylyn Rong M.D.   On: 09/13/2022 17:08      LOS: 0 days     Joycelyn Das, MD [facility] Available via Epic secure chat 7am-7pm After these hours, please refer to coverage provider listed on [email or web address] 09/14/2022, 10:25 AM

## 2022-09-15 DIAGNOSIS — I10 Essential (primary) hypertension: Secondary | ICD-10-CM | POA: Diagnosis not present

## 2022-09-15 DIAGNOSIS — I5043 Acute on chronic combined systolic (congestive) and diastolic (congestive) heart failure: Secondary | ICD-10-CM | POA: Diagnosis not present

## 2022-09-15 DIAGNOSIS — N189 Chronic kidney disease, unspecified: Secondary | ICD-10-CM | POA: Diagnosis not present

## 2022-09-15 DIAGNOSIS — R600 Localized edema: Secondary | ICD-10-CM | POA: Diagnosis not present

## 2022-09-15 LAB — BASIC METABOLIC PANEL
Anion gap: 9 (ref 5–15)
BUN: 55 mg/dL — ABNORMAL HIGH (ref 8–23)
CO2: 26 mmol/L (ref 22–32)
Calcium: 8.8 mg/dL — ABNORMAL LOW (ref 8.9–10.3)
Chloride: 106 mmol/L (ref 98–111)
Creatinine, Ser: 3.78 mg/dL — ABNORMAL HIGH (ref 0.61–1.24)
GFR, Estimated: 16 mL/min — ABNORMAL LOW (ref >60)
Glucose, Bld: 105 mg/dL — ABNORMAL HIGH (ref 70–99)
Potassium: 3.8 mmol/L (ref 3.5–5.1)
Sodium: 141 mmol/L (ref 135–145)

## 2022-09-15 LAB — CBC
HCT: 27 % — ABNORMAL LOW (ref 39.0–52.0)
Hemoglobin: 8.2 g/dL — ABNORMAL LOW (ref 13.0–17.0)
MCH: 26 pg (ref 26.0–34.0)
MCHC: 30.4 g/dL (ref 30.0–36.0)
MCV: 85.7 fL (ref 80.0–100.0)
Platelets: 126 10*3/uL — ABNORMAL LOW (ref 150–400)
RBC: 3.15 MIL/uL — ABNORMAL LOW (ref 4.22–5.81)
RDW: 18.3 % — ABNORMAL HIGH (ref 11.5–15.5)
WBC: 4.9 10*3/uL (ref 4.0–10.5)
nRBC: 0 % (ref 0.0–0.2)

## 2022-09-15 LAB — MAGNESIUM: Magnesium: 2.2 mg/dL (ref 1.7–2.4)

## 2022-09-15 LAB — PHOSPHORUS: Phosphorus: 3.8 mg/dL (ref 2.5–4.6)

## 2022-09-15 LAB — HEPATITIS B SURFACE ANTIBODY, QUANTITATIVE: Hep B S AB Quant (Post): <3.5 m[IU]/mL — ABNORMAL LOW (ref >9.9)

## 2022-09-15 MED ORDER — HEPARIN SODIUM (PORCINE) 1000 UNIT/ML IJ SOLN
INTRAMUSCULAR | Status: AC
  Filled 2022-09-15: qty 10

## 2022-09-15 MED ORDER — EPOETIN ALFA 10000 UNIT/ML IJ SOLN: 4000.0000 [IU] | INTRAMUSCULAR | Status: DC

## 2022-09-15 NOTE — Progress Notes (Signed)
[facility]  ROUNDING NOTE   Subjective:   Ronald [name] is a 77 year old male with past medical conditions including CAD, type 2 diabetes, emphysema, GERD, hyperlipidemia, hypertension, AAA, and end-stage renal disease on hemodialysis.  Patient presents to the emergency department with complaints of leg swelling and has been admitted under observation for Peripheral edema [R60.0] DOE (dyspnea on exertion) [R06.09] 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] Bilateral leg edema [R60.0]  Patient is known to our practice and receives outpatient dialysis treatments at [facility].    Patient seen and evaluated during dialysis   HEMODIALYSIS FLOWSHEET:  Blood Flow Rate (mL/min): 300 mL/min Arterial Pressure (mmHg): -100 mmHg Venous Pressure (mmHg): 110 mmHg TMP (mmHg): 19 mmHg Ultrafiltration Rate (mL/min): 971 mL/min Dialysate Flow Rate (mL/min): 300 ml/min  Tolerating treatment well Room air Lower extremity edema remains   Objective:  Vital signs in last 24 hours:  Temp:  [97.7 F (36.5 C)-98 F (36.7 C)] 97.8 F (36.6 C) (04/17 0806) Pulse Rate:  [89-95] 95 (04/17 1100) Resp:  [13-18] 14 (04/17 1100) BP: (126-150)/(65-92) 135/72 (04/17 1100) SpO2:  [94 %-99 %] 97 % (04/17 1100) Weight:  [106.6 kg-106.8 kg] 106.8 kg (04/17 0806)  Weight change:  Filed Weights   09/14/22 1247 09/15/22 0500 09/15/22 0806  Weight: 106.6 kg 106.8 kg 106.8 kg    Intake/Output: I/O last 3 completed shifts: In: 240 [P.O.:240] Out: 950 [Urine:450; Other:500]   Intake/Output this shift:  No intake/output data recorded.  Physical Exam: General: NAD  Head: Normocephalic, atraumatic. Moist oral mucosal membranes  Eyes: Anicteric  Lungs:  Diminished in bases, normal effort, room air  Heart: Regular rate and rhythm  Abdomen:  Soft, nontender  Extremities: 3+ peripheral edema.  Neurologic: Alert and oriented, moving all four extremities  Skin: No lesions  Access: Right  chest PermCath    Basic Metabolic Panel: Recent Labs  Lab 09/13/22 1429 09/14/22 0451 09/15/22 0456  NA 144 146* 141  K 3.8 3.6 3.8  CL 108 109 106  CO2 25 26 26   GLUCOSE 129* 110* 105*  BUN 90* 93* 55*  CREATININE 5.02* 4.82* 3.78*  CALCIUM 8.8* 8.7* 8.8*  MG  --   --  2.2     Liver Function Tests: Recent Labs  Lab 09/13/22 1429 09/14/22 0451  AST 53* 51*  ALT 46* 43  ALKPHOS 140* 116  BILITOT 1.3* 1.6*  PROT 5.8* 5.5*  ALBUMIN 2.8* 2.9*    Recent Labs  Lab 09/13/22 1429  LIPASE 42    No results for input(s): "AMMONIA" in the last 168 hours.  CBC: Recent Labs  Lab 09/13/22 1429 09/14/22 0451 09/15/22 0456  WBC 6.8 6.1 4.9  HGB 8.6* 8.3* 8.2*  HCT 28.2* 27.1* 27.0*  MCV 87.6 88.3 85.7  PLT 143* 131* 126*     Cardiac Enzymes: No results for input(s): "CKTOTAL", "CKMB", "CKMBINDEX", "TROPONINI" in the last 168 hours.  BNP: Invalid input(s): "POCBNP"  CBG: Recent Labs  Lab 09/10/22 1339  GLUCAP 101*     Microbiology: Results for orders placed or performed during the hospital encounter of 05/28/22  Resp panel by RT-PCR (RSV, Flu A&B, Covid) Anterior Nasal Swab     Status: None   Collection Time: 05/28/22  8:19 PM   Specimen: Anterior Nasal Swab  Result Value Ref Range Status   SARS Coronavirus 2 by RT PCR NEGATIVE NEGATIVE Final    Comment: (NOTE) SARS-CoV-2 target nucleic acids are NOT DETECTED.  The SARS-CoV-2 RNA is  generally detectable in upper respiratory specimens during the acute phase of infection. The lowest concentration of SARS-CoV-2 viral copies this assay can detect is 138 copies/mL. A negative result does not preclude SARS-Cov-2 infection and should not be used as the sole basis for treatment or other patient management decisions. A negative result may occur with  improper specimen collection/handling, submission of specimen other than nasopharyngeal swab, presence of viral mutation(s) within the areas targeted by this  assay, and inadequate number of viral copies(<138 copies/mL). A negative result must be combined with clinical observations, patient history, and epidemiological information. The expected result is Negative.  Fact Sheet for Patients:  BloggerCourse.com  Fact Sheet for Healthcare Providers:  SeriousBroker.it  This test is no t yet approved or cleared by the [place] FDA and  has been authorized for detection and/or diagnosis of SARS-CoV-2 by FDA under an Emergency Use Authorization (EUA). This EUA will remain  in effect (meaning this test can be used) for the duration of the COVID-19 declaration under Section 564(b)(1) of the Act, 21 U.S.C.section 360bbb-3(b)(1), unless the authorization is terminated  or revoked sooner.       Influenza A by PCR NEGATIVE NEGATIVE Final   Influenza B by PCR NEGATIVE NEGATIVE Final    Comment: (NOTE) The Xpert Xpress SARS-CoV-2/FLU/RSV plus assay is intended as an aid in the diagnosis of influenza from Nasopharyngeal swab specimens and should not be used as a sole basis for treatment. Nasal washings and aspirates are unacceptable for Xpert Xpress SARS-CoV-2/FLU/RSV testing.  Fact Sheet for Patients: BloggerCourse.com  Fact Sheet for Healthcare Providers: SeriousBroker.it  This test is not yet approved or cleared by the [place] FDA and has been authorized for detection and/or diagnosis of SARS-CoV-2 by FDA under an Emergency Use Authorization (EUA). This EUA will remain in effect (meaning this test can be used) for the duration of the COVID-19 declaration under Section 564(b)(1) of the Act, 21 U.S.C. section 360bbb-3(b)(1), unless the authorization is terminated or revoked.     Resp Syncytial Virus by PCR NEGATIVE NEGATIVE Final    Comment: (NOTE) Fact Sheet for Patients: BloggerCourse.com  Fact Sheet for  Healthcare Providers: SeriousBroker.it  This test is not yet approved or cleared by the [place] FDA and has been authorized for detection and/or diagnosis of SARS-CoV-2 by FDA under an Emergency Use Authorization (EUA). This EUA will remain in effect (meaning this test can be used) for the duration of the COVID-19 declaration under Section 564(b)(1) of the Act, 21 U.S.C. section 360bbb-3(b)(1), unless the authorization is terminated or revoked.  Performed at [facility], [address] [place], [place] [address]     Coagulation Studies: No results for input(s): "LABPROT", "INR" in the last 72 hours.  Urinalysis: Recent Labs    09/13/22 1716  COLORURINE YELLOW*  LABSPEC 1.010  PHURINE 5.0  GLUCOSEU NEGATIVE  HGBUR NEGATIVE  BILIRUBINUR NEGATIVE  KETONESUR NEGATIVE  PROTEINUR NEGATIVE  NITRITE NEGATIVE  LEUKOCYTESUR NEGATIVE       Imaging: CT CHEST ABDOMEN PELVIS WO CONTRAST  Result Date: 09/13/2022 CLINICAL DATA:  Shortness of breath, fluid overload, leg swelling, abdominal distension. Recent peritoneal dialysis, anticipation of starting hemodialysis tomorrow. Lower abdominal pain. EXAM: CT CHEST, ABDOMEN AND PELVIS WITHOUT CONTRAST TECHNIQUE: Multidetector CT imaging of the chest, abdomen and pelvis was performed following the standard protocol without IV contrast. RADIATION DOSE REDUCTION: This exam was performed according to the departmental dose-optimization program which includes automated exposure control, adjustment of the mA and/or kV  according to patient size and/or use of iterative reconstruction technique. COMPARISON:  02/12/2021 FINDINGS: CT CHEST FINDINGS Cardiovascular: Hemodialysis catheter noted with tip projecting over the cavoatrial junction, recently placed on 09/10/2022. Miniscule amount of gas along the lower platysma muscle on the right is not unexpected in this setting, and no drainable abscess or similar  complicating feature is observed. Prior CABG. Coronary, aortic arch, and branch vessel atherosclerotic vascular disease. Moderate cardiomegaly. Low-density blood pool compatible with anemia. Mediastinum/Nodes: Unremarkable Lungs/Pleura: Small bilateral pleural effusions with associated passive atelectasis. Scarring at the right lung apex. Patchy ground-glass opacities favoring the upper lobes, nonspecific for edema versus atypical/viral pneumonia although I favor the former. Mild paraseptal emphysema. Musculoskeletal: Mild thoracic spondylosis. CT ABDOMEN PELVIS FINDINGS Hepatobiliary: Cholecystectomy.  Otherwise unremarkable. Pancreas: Stable punctate calcification along the pancreatic head on image 69 series 2, likely postinflammatory. No acute pancreatic findings. Spleen: Unremarkable Adrenals/Urinary Tract: Both adrenal glands appear normal. Stable benign bilateral renal cysts warranting no further workup. Bilateral renal atrophy. Stomach/Bowel: Unremarkable Vascular/Lymphatic: Atherosclerosis is present, including aortoiliac atherosclerotic disease. There is notable atheromatous plaque proximally in the celiac trunk and SMA, patency not a stab list on today's noncontrast exam. Infrarenal abdominal aortic aneurysm 3.1 cm in anterior-posterior dimension on image 75 series 2, stable. Reproductive: Unremarkable Other: Small amount of ascites primarily in the vicinity of the peritoneal dialysis catheter. Trace retroperitoneal edema bilaterally along with bilateral flank edema in the subcutaneous tissues. Small amount of subcutaneous edema along the inferior margin of the pannus. Musculoskeletal: Mild grade 1 degenerative anterolisthesis at L4-5 with bilateral degenerative facet arthropathy at this level. Small umbilical hernia contains adipose tissue. IMPRESSION: 1. Small bilateral pleural effusions with associated passive atelectasis. 2. Patchy ground-glass opacities favoring the upper lobes, nonspecific for edema  versus atypical/viral pneumonia although I favor the former. 3. Moderate cardiomegaly. Low-density blood pool compatible with anemia. 4. Small amount of ascites primarily in the vicinity of the peritoneal dialysis catheter. 5. Infrarenal abdominal aortic aneurysm 3.1 cm in anterior-posterior dimension, stable. Recommend follow up ultrasound every 3 years. 6. Bilateral renal atrophy. 7. Small umbilical hernia contains adipose tissue. 8. Aortic atherosclerosis. 9. Emphysema. 10. Recently placed hemodialysis catheter appear satisfactorily positioned. Aortic Atherosclerosis (ICD10-I70.0) and Emphysema (ICD10-J43.9). Electronically Signed   By: Gaylyn Rong M.D.   On: 09/13/2022 17:08     Medications:    sodium chloride     anticoagulant sodium citrate      acetaminophen  500 mg Oral QHS   allopurinol  100 mg Oral Daily   amiodarone  200 mg Oral Daily   apixaban  5 mg Oral BID   atorvastatin  40 mg Oral QHS   Chlorhexidine Gluconate Cloth  6 each Topical Q0600   levothyroxine  75 mcg Oral Q0600   metoprolol tartrate  50 mg Oral BID   pantoprazole  40 mg Oral BID   polyethylene glycol  17 g Oral QHS   sodium bicarbonate  650 mg Oral BID   sodium chloride flush  3 mL Intravenous Q12H   sodium chloride flush  3 mL Intravenous Q12H   sodium chloride, alteplase, anticoagulant sodium citrate, heparin, lidocaine (PF), lidocaine-prilocaine, morphine injection, pentafluoroprop-tetrafluoroeth, sodium chloride flush  Assessment/ Plan:  [name] is a 77 y.o.  male with past medical conditions including CAD, type 2 diabetes, emphysema, GERD, hyperlipidemia, hypertension, AAA, and end-stage renal disease on hemodialysis.  Patient presents to the emergency department with complaints of leg swelling and has been admitted under observation for Peripheral  edema [R60.0] DOE (dyspnea on exertion) [R06.09] 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] Bilateral leg edema [R60.0]  [facility]  [place]/TTS/right chest PermCath  Fluid volume overload with end-stage renal disease on hemodialysis.  Patient recently transition from peritoneal to hemodialysis due to intolerance.  Patient received dialysis treatment yesterday, UF 0.5 L achieved.  Patient receiving additional treatment today with more aggressive fluid removal, UF 2 to 2.5 L as tolerated.  Next treatment scheduled for Thursday.  Will defer discharge planning to primary team.  2. Anemia of chronic kidney disease Lab Results  Component Value Date   HGB 8.2 (L) 09/15/2022    Hemoglobin below desired target.  Will order low-dose EPO with scheduled dialysis.  3. Secondary Hyperparathyroidism: with outpatient labs:  phosphorus 3.9, calcium 8.9 on 08/26/22.   Lab Results  Component Value Date   CALCIUM 8.8 (L) 09/15/2022   PHOS 4.2 05/30/2022    Calcium remains acceptable,  awaiting phosphorus level  4.  Hypertension with chronic kidney disease. Home regimen includes amiodarone, furosemide, isosorbide, and metoprolol.  Currently receiving amiodarone and metoprolol.  Patient received 1 dose IV furosemide 80 mg in ED.  Blood pressure 135/72 during dialysis.    LOS: 1   4/17/202411:05 AM

## 2022-09-15 NOTE — Progress Notes (Signed)
Hemodialysis note  Received patient in bed to unit. Alert and oriented.  Informed consent signed and in chart.  Treatment initiated: 0816 Treatment completed: 1150  Patient tolerated well. Transported back to room, alert without acute distress.  Report given to patient's RN.   Access used: Right Chest HD Catheter Access issues: none  Total UF removed: 2.5L Medication(s) given: none Post HD weight: 103.5 kg   [name]

## 2022-09-15 NOTE — Progress Notes (Signed)
PROGRESS NOTE    [facility]  BMW:[id number] DOB: July 28, 1945 DOA: 09/13/2022 PCP: Reubin Milan, MD    Brief Narrative:  Ronald [name] is an 77 y.o. male with past medical history of congestive heart failure, hypertension, end-stage renal disease, coronary artery disease, hypothyroidism, paroxysmal atrial fibrillation presented to hospital with worsening bilateral lower extremity edema and plans for initiating hemodialysis.  He also complained of shortness of breath.  Of note, patient has end-stage renal disease and was started on peritoneal dialysis which he could not tolerate due to abdominal pain and had permacath placed in on September 10, 2022.[name]  Patient is being followed by Dr. Cherylann Ratel, nephrologist.  Patient also has an extensive cardiac history of CABG in 1996 with a redo bypass in 2000 and history of A-fib flutter with electrical cardioversion in December 2023 patient had an echo done which showed an ejection fraction of 35 to 40% with grade 2 diastolic dysfunction.  In February 2024 patient was admitted for chest pain where he received heparin drip but catheterization was deferred due to end-stage renal disease.  Patient is on amiodarone Eliquis for his atrial fibrillation. He also has anemia with baseline hemoglobin of 9, but also intermittent thrombocytopenia that has been chronic.  At this time patient has been admitted for hemodialysis initiation and received first hemodialysis session on 09/14/22.  Assessment and plan.  Acute on chronic combined systolic (congestive) and diastolic (congestive) heart failure Patient presented with with increasing lower extremity edema and shortness of breath.  On hemodialysis for volume management.  Continue strict intake and output charting Daily weights  Essential hypertension Metoprolol dose was decreased.  Continue Imdur.  Blood pressure seems to be stable at this time.  Coronary artery disease Continue Eliquis, statin and amiodarone.  No acute  issues at this time  Paroxysmal atrial fibrillation Initial EKG showed sinus rhythm.  Continue amiodarone/ metoprolol / Imdur / eliquis.  Hypothyroidism Continue Synthroid.   GERD.  Continue PPI      DVT prophylaxis:  apixaban (ELIQUIS) tablet 5 mg   Code Status:     Code Status: Full Code  Disposition: Home  Status is: Inpatient  The patient is inpatient because: Initiation of hemodialysis, will need outpatient hemodialysis set up,   Family Communication: None at bedside  Consultants:  Nephrology  Procedures:  Hemodialysis  Antimicrobials:  None  Anti-infectives (From admission, onward)    None      Subjective: Today, patient was seen and examined at bedside.  Complains of some burning sensation in the lower abdomen and back.  Feels little constipated.  Objective: Vitals:   09/14/22 1626 09/14/22 1932 09/15/22 0436 09/15/22 0500  BP: 126/70 134/77 127/70   Pulse: 91 91 89   Resp: 17 18 18    Temp: 98 F (36.7 C) 97.8 F (36.6 C) 97.7 F (36.5 C)   TempSrc: Oral  Oral   SpO2: 99% 97% 96%   Weight:    106.8 kg    Intake/Output Summary (Last 24 hours) at 09/15/2022 0750 Last data filed at 09/14/2022 1902 Gross per 24 hour  Intake 240 ml  Output 500 ml  Net -260 ml    Filed Weights   09/14/22 1008 09/14/22 1247 09/15/22 0500  Weight: 107.3 kg 106.6 kg 106.8 kg    Physical Examination: Body mass index is 34.77 kg/m.   General: Alert awake and Communicative, obese built, elderly male. HENT:   No scleral pallor or icterus noted. Oral mucosa is moist.  Right  chest wall hemodialysis catheter in place Chest:  .  Diminished breath sounds bilaterally. No crackles or wheezes.  CVS: S1 &S2 heard. No murmur.  Regular rate and rhythm. Abdomen: Soft, nontender, nondistended.  Peritoneal dialysis catheter in place with dressing, nonspecific tenderness palpation, bowel sounds are heard.   Extremities: No cyanosis, clubbing with gross bilateral pitting edema,  peripheral pulses are palpable. Psych: Alert, awake and oriented, normal mood CNS:  No cranial nerve deficits.  Power equal in all extremities.   Skin: Warm and dry.  No rashes noted.  Data Reviewed:   CBC: Recent Labs  Lab 09/13/22 1429 09/14/22 0451 09/15/22 0456  WBC 6.8 6.1 4.9  HGB 8.6* 8.3* 8.2*  HCT 28.2* 27.1* 27.0*  MCV 87.6 88.3 85.7  PLT 143* 131* 126*     Basic Metabolic Panel: Recent Labs  Lab 09/13/22 1429 09/14/22 0451 09/15/22 0456  NA 144 146* 141  K 3.8 3.6 3.8  CL 108 109 106  CO2 GLUCOSE 129* 110* 105*  BUN 90* 93* 55*  CREATININE 5.02* 4.82* 3.78*  CALCIUM 8.8* 8.7* 8.8*  MG  --   --  2.2     Liver Function Tests: Recent Labs  Lab 09/13/22 1429 09/14/22 0451  AST 53* 51*  ALT 46* 43  ALKPHOS 140* 116  BILITOT 1.3* 1.6*  PROT 5.8* 5.5*  ALBUMIN 2.8* 2.9*      Radiology Studies: CT CHEST ABDOMEN PELVIS WO CONTRAST  Result Date: 09/13/2022 CLINICAL DATA:  Shortness of breath, fluid overload, leg swelling, abdominal distension. Recent peritoneal dialysis, anticipation of starting hemodialysis tomorrow. Lower abdominal pain. EXAM: CT CHEST, ABDOMEN AND PELVIS WITHOUT CONTRAST TECHNIQUE: Multidetector CT imaging of the chest, abdomen and pelvis was performed following the standard protocol without IV contrast. RADIATION DOSE REDUCTION: This exam was performed according to the departmental dose-optimization program which includes automated exposure control, adjustment of the mA and/or kV according to patient size and/or use of iterative reconstruction technique. COMPARISON:  02/12/2021 FINDINGS: CT CHEST FINDINGS Cardiovascular: Hemodialysis catheter noted with tip projecting over the cavoatrial junction, recently placed on 09/10/2022. Miniscule amount of gas along the lower platysma muscle on the right is not unexpected in this setting, and no drainable abscess or similar complicating feature is observed. Prior CABG. Coronary, aortic  arch, and branch vessel atherosclerotic vascular disease. Moderate cardiomegaly. Low-density blood pool compatible with anemia. Mediastinum/Nodes: Unremarkable Lungs/Pleura: Small bilateral pleural effusions with associated passive atelectasis. Scarring at the right lung apex. Patchy ground-glass opacities favoring the upper lobes, nonspecific for edema versus atypical/viral pneumonia although I favor the former. Mild paraseptal emphysema. Musculoskeletal: Mild thoracic spondylosis. CT ABDOMEN PELVIS FINDINGS Hepatobiliary: Cholecystectomy.  Otherwise unremarkable. Pancreas: Stable punctate calcification along the pancreatic head on image 69 series 2, likely postinflammatory. No acute pancreatic findings. Spleen: Unremarkable Adrenals/Urinary Tract: Both adrenal glands appear normal. Stable benign bilateral renal cysts warranting no further workup. Bilateral renal atrophy. Stomach/Bowel: Unremarkable Vascular/Lymphatic: Atherosclerosis is present, including aortoiliac atherosclerotic disease. There is notable atheromatous plaque proximally in the celiac trunk and SMA, patency not a stab list on today's noncontrast exam. Infrarenal abdominal aortic aneurysm 3.1 cm in anterior-posterior dimension on image 75 series 2, stable. Reproductive: Unremarkable Other: Small amount of ascites primarily in the vicinity of the peritoneal dialysis catheter. Trace retroperitoneal edema bilaterally along with bilateral flank edema in the subcutaneous tissues. Small amount of subcutaneous edema along the inferior margin of the pannus. Musculoskeletal: Mild grade 1 degenerative anterolisthesis at L4-5 with bilateral degenerative  facet arthropathy at this level. Small umbilical hernia contains adipose tissue. IMPRESSION: 1. Small bilateral pleural effusions with associated passive atelectasis. 2. Patchy ground-glass opacities favoring the upper lobes, nonspecific for edema versus atypical/viral pneumonia although I favor the former. 3.  Moderate cardiomegaly. Low-density blood pool compatible with anemia. 4. Small amount of ascites primarily in the vicinity of the peritoneal dialysis catheter. 5. Infrarenal abdominal aortic aneurysm 3.1 cm in anterior-posterior dimension, stable. Recommend follow up ultrasound every 3 years. 6. Bilateral renal atrophy. 7. Small umbilical hernia contains adipose tissue. 8. Aortic atherosclerosis. 9. Emphysema. 10. Recently placed hemodialysis catheter appear satisfactorily positioned. Aortic Atherosclerosis (ICD10-I70.0) and Emphysema (ICD10-J43.9). Electronically Signed   By: Gaylyn Rong M.D.   On: 09/13/2022 17:08      LOS: 1 day     Joycelyn Das, MD [facility] Available via Epic secure chat 7am-7pm After these hours, please refer to coverage provider listed on [email or web address] 09/15/2022, 7:50 AM

## 2022-09-15 NOTE — TOC Initial Note (Signed)
Transition of Care [facility]) - Initial/Assessment Note    Patient Details  Name: Ronald [name] MRN: [id number] Date of Birth: 1946/02/19  Transition of Care [name]) CM/SW Contact:    Chapman Fitch, RN Phone Number: 09/15/2022, 1:51 PM  Clinical Narrative:                   Transition of Care [name]) Screening Note   Patient Details  Name: Ronald [name] Date of Birth: [birth year]   Transition of Care [facility]) CM/SW Contact:    Chapman Fitch, RN Phone Number: 09/15/2022, 1:51 PM    Transition of Care Department [facility]) has reviewed patient and no TOC needs have been identified at this time. We will continue to monitor patient advancement through interdisciplinary progression rounds. If new patient transition needs arise, please place a TOC consult.   Per Gery Pray with nephrology patient is established outpatient and she will notify clinic when patient discharges        Patient Goals and CMS Choice            Expected Discharge Plan and Services                                              Prior Living Arrangements/Services                       Activities of Daily Living Home Assistive Devices/Equipment: Other (Comment) (PD) ADL Screening (condition at time of admission) Patient's cognitive ability adequate to safely complete daily activities?: Yes Is the patient deaf or have difficulty hearing?: Yes Does the patient have difficulty seeing, even when wearing glasses/contacts?: No Does the patient have difficulty concentrating, remembering, or making decisions?: No Patient able to express need for assistance with ADLs?: Yes Does the patient have difficulty dressing or bathing?: Yes Independently performs ADLs?: No Communication: Independent Dressing (OT): Independent Grooming: Independent Feeding: Independent Bathing: Independent Toileting: Needs assistance Is this a change from baseline?: Change from baseline, expected to last <3  days In/Out Bed: Needs assistance Is this a change from baseline?: Change from baseline, expected to last <3 days Walks in Home: Needs assistance Is this a change from baseline?: Change from baseline, expected to last <3 days Does the patient have difficulty walking or climbing stairs?: Yes Weakness of Legs: Both Weakness of Arms/Hands: None  Permission Sought/Granted                  Emotional Assessment              Admission diagnosis:  Peripheral edema [R60.0] DOE (dyspnea on exertion) [R06.09] 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] Bilateral leg edema [R60.0] Patient Active Problem List   Diagnosis Date Noted   Bilateral leg edema 09/13/2022   Hyp chr kidney disease w stage 5 chr kidney disease or ESRD 07/22/2022   Coronary artery disease of native artery of native heart with stable angina pectoris 07/22/2022   OSA (obstructive sleep apnea) 07/22/2022   Angina pectoris 07/17/2022   Unstable angina 07/15/2022   Chronic systolic CHF (congestive heart failure) 07/15/2022   Typical atrial flutter 07/15/2022   Acute on chronic combined systolic (congestive) and diastolic (congestive) heart failure 05/28/2022   Hypothyroidism 05/28/2022   Coronary artery disease 05/28/2022   GERD without esophagitis 05/28/2022   Essential hypertension 05/05/2022   Prediabetes 04/06/2022  Spinal stenosis of lumbar region without neurogenic claudication 04/06/2022   Weakness of both lower extremities 04/06/2022   Morbid obesity 04/06/2022   Upper airway cough syndrome 10/01/2021   Hyperparathyroidism due to renal insufficiency 05/18/2019   Bursitis of hip 12/18/2018   Paroxysmal atrial fibrillation 06/10/2018   Unspecified atrial fibrillation 06/10/2018   CKD stage 5 secondary to hypertension 07/29/2017   Atherosclerosis of native arteries of extremity with intermittent claudication 06/25/2016   Atherosclerosis of native arteries of extremities with intermittent claudication,  unspecified extremity 06/25/2016   Dilated cardiomyopathy 10/07/2015   Coronary artery disease involving native coronary artery with angina pectoris    Allergic rhinitis 08/19/2015   Myocardial infarction 12/13/2014   Anemia of renal disease 10/04/2014   AA (aortic aneurysm) 10/04/2014   Acid reflux 10/04/2014   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   Basal cell papilloma 10/04/2014   Unspecified osteoarthritis, unspecified site 10/04/2014   Thoracic aortic aneurysm (TAA) 10/04/2014   Atherosclerotic heart disease of native coronary artery without angina pectoris 10/04/2014   Hypertensive heart disease without CHF 12/30/2012   Benign prostatic hyperplasia with lower urinary tract symptoms 10/24/2012   DOE (dyspnea on exertion) 11/12/2009   Renal artery stenosis     Mixed hyperlipidemia 09/04/2008   History of redo bypass grafting 09/04/2008   PCP:  Reubin Milan, MD Pharmacy:   [facility] [address], [place] - [address] [place] [address] Phone: [phone] Fax: [phone]  [facility] - [place], [place] - [address] [place] [address] Phone: [phone] Fax: [phone]     Social Determinants of Health (SDOH) Social History: SDOH Screenings   Food Insecurity: No Food Insecurity (09/14/2022)  Housing: Low Risk  (09/14/2022)  Transportation Needs: No Transportation Needs (09/14/2022)  Utilities: Not At Risk (09/14/2022)  Alcohol Screen: Low Risk  (10/28/2021)  Depression (PHQ2-9): High Risk (07/26/2022)  Financial Resource Strain: Low Risk  (06/25/2022)  Physical Activity: Inactive (06/25/2022)  Social Connections: Moderately Isolated (06/25/2022)  Stress: No Stress Concern Present (06/25/2022)  Tobacco Use: Medium Risk (09/14/2022)   SDOH Interventions:     Readmission Risk Interventions     No data to display

## 2022-09-16 DIAGNOSIS — N186 End stage renal disease: Secondary | ICD-10-CM

## 2022-09-16 DIAGNOSIS — I5043 Acute on chronic combined systolic (congestive) and diastolic (congestive) heart failure: Secondary | ICD-10-CM | POA: Diagnosis not present

## 2022-09-16 DIAGNOSIS — R5381 Other malaise: Secondary | ICD-10-CM | POA: Diagnosis present

## 2022-09-16 DIAGNOSIS — Z992 Dependence on renal dialysis: Secondary | ICD-10-CM

## 2022-09-16 LAB — CBC
HCT: 28.3 % — ABNORMAL LOW (ref 39.0–52.0)
Hemoglobin: 8.7 g/dL — ABNORMAL LOW (ref 13.0–17.0)
MCH: 26 pg (ref 26.0–34.0)
MCHC: 30.7 g/dL (ref 30.0–36.0)
MCV: 84.5 fL (ref 80.0–100.0)
Platelets: 107 10*3/uL — ABNORMAL LOW (ref 150–400)
RBC: 3.35 MIL/uL — ABNORMAL LOW (ref 4.22–5.81)
RDW: 18.1 % — ABNORMAL HIGH (ref 11.5–15.5)
WBC: 5.4 10*3/uL (ref 4.0–10.5)
nRBC: 0 % (ref 0.0–0.2)

## 2022-09-16 LAB — BASIC METABOLIC PANEL
Anion gap: 9 (ref 5–15)
BUN: 32 mg/dL — ABNORMAL HIGH (ref 8–23)
CO2: 29 mmol/L (ref 22–32)
Calcium: 8.7 mg/dL — ABNORMAL LOW (ref 8.9–10.3)
Chloride: 99 mmol/L (ref 98–111)
Creatinine, Ser: 3.12 mg/dL — ABNORMAL HIGH (ref 0.61–1.24)
GFR, Estimated: 20 mL/min — ABNORMAL LOW (ref >60)
Glucose, Bld: 104 mg/dL — ABNORMAL HIGH (ref 70–99)
Potassium: 3.2 mmol/L — ABNORMAL LOW (ref 3.5–5.1)
Sodium: 137 mmol/L (ref 135–145)

## 2022-09-16 LAB — MAGNESIUM: Magnesium: 1.9 mg/dL (ref 1.7–2.4)

## 2022-09-16 MED ORDER — EPOETIN ALFA 4000 UNIT/ML IJ SOLN
INTRAMUSCULAR | Status: AC
  Filled 2022-09-16: qty 1

## 2022-09-16 MED ORDER — METOPROLOL TARTRATE 50 MG PO TABS: 50.0000 mg | ORAL_TABLET | Freq: Two times a day (BID) | ORAL | 2 refills | Status: DC

## 2022-09-16 MED ORDER — EPOETIN ALFA 4000 UNIT/ML IJ SOLN
4000.0000 [IU] | INTRAMUSCULAR | Status: DC
  Administered 2022-09-16: 4000 [IU] via SUBCUTANEOUS

## 2022-09-16 NOTE — Progress Notes (Signed)
Hemodialysis note  Received patient in bed to unit. Alert and oriented.  Informed consent signed and in chart.  Treatment initiated: 0750 Treatment completed: 1130  Patient tolerated well. Transported back to room, alert without acute distress.  Report given to patient's RN.   Access used: Right chest HD catheter Access issues: none  Total UF removed: 2.5L Medication(s) given:  Epogen 4000 units Post HD weight: 100.8 kg   [name]

## 2022-09-16 NOTE — Progress Notes (Signed)
[facility]  ROUNDING NOTE   Subjective:   Ronald [name] is a 77 year old male with past medical conditions including CAD, type 2 diabetes, emphysema, GERD, hyperlipidemia, hypertension, AAA, and end-stage renal disease on hemodialysis.  Patient presents to the emergency department with complaints of leg swelling and has been admitted under observation for Peripheral edema [R60.0] DOE (dyspnea on exertion) [R06.09] 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] Bilateral leg edema [R60.0]  Patient is known to our practice and receives outpatient dialysis treatments at [facility].    Patient seen and evaluated during dialysis   HEMODIALYSIS FLOWSHEET:  Blood Flow Rate (mL/min): 400 mL/min Arterial Pressure (mmHg): -150 mmHg Venous Pressure (mmHg): 150 mmHg TMP (mmHg): 13 mmHg Ultrafiltration Rate (mL/min): 971 mL/min Dialysate Flow Rate (mL/min): 300 ml/min  Denies pain or discomfort States he feels well Denies shortness of breath   Objective:  Vital signs in last 24 hours:  Temp:  [97.8 F (36.6 C)-98.9 F (37.2 C)] 97.8 F (36.6 C) (04/18 0739) Pulse Rate:  [91-97] 97 (04/18 1100) Resp:  [13-20] 16 (04/18 1100) BP: (114-143)/(58-77) 138/72 (04/18 1100) SpO2:  [96 %-100 %] 97 % (04/18 1100) Weight:  [103.5 kg-104.3 kg] 103.5 kg (04/18 0739)  Weight change: -0.5 kg Filed Weights   09/15/22 1200 09/16/22 0500 09/16/22 0739  Weight: 103.5 kg 104.3 kg 103.5 kg    Intake/Output: I/O last 3 completed shifts: In: 600 [P.O.:600] Out: 2500 [Other:2500]   Intake/Output this shift:  No intake/output data recorded.  Physical Exam: General: NAD  Head: Normocephalic, atraumatic. Moist oral mucosal membranes  Eyes: Anicteric  Lungs:  Diminished in bases, normal effort, room air  Heart: Regular rate and rhythm  Abdomen:  Soft, nontender  Extremities: 2+ peripheral edema.  Neurologic: Alert and oriented, moving all four extremities  Skin: No lesions  Access:  Right chest PermCath    Basic Metabolic Panel: Recent Labs  Lab 09/13/22 1429 09/14/22 0451 09/15/22 0456 09/16/22 0547  NA 144 146* 141 137  K 3.8 3.6 3.8 3.2*  CL 108 109 106 99  CO2 GLUCOSE 129* 110* 105* 104*  BUN 90* 93* 55* 32*  CREATININE 5.02* 4.82* 3.78* 3.12*  CALCIUM 8.8* 8.7* 8.8* 8.7*  MG  --   --  2.2 1.9  PHOS  --   --  3.8  --      Liver Function Tests: Recent Labs  Lab 09/13/22 1429 09/14/22 0451  AST 53* 51*  ALT 46* 43  ALKPHOS 140* 116  BILITOT 1.3* 1.6*  PROT 5.8* 5.5*  ALBUMIN 2.8* 2.9*    Recent Labs  Lab 09/13/22 1429  LIPASE 42    No results for input(s): "AMMONIA" in the last 168 hours.  CBC: Recent Labs  Lab 09/13/22 1429 09/14/22 0451 09/15/22 0456 09/16/22 0547  WBC 6.8 6.1 4.9 5.4  HGB 8.6* 8.3* 8.2* 8.7*  HCT 28.2* 27.1* 27.0* 28.3*  MCV 87.6 88.3 85.7 84.5  PLT 143* 131* 126* 107*     Cardiac Enzymes: No results for input(s): "CKTOTAL", "CKMB", "CKMBINDEX", "TROPONINI" in the last 168 hours.  BNP: Invalid input(s): "POCBNP"  CBG: Recent Labs  Lab 09/10/22 1339  GLUCAP 101*     Microbiology: Results for orders placed or performed during the hospital encounter of 05/28/22  Resp panel by RT-PCR (RSV, Flu A&B, Covid) Anterior Nasal Swab     Status: None   Collection Time: 05/28/22  8:19 PM   Specimen: Anterior Nasal Swab  Result Value Ref Range Status   SARS Coronavirus 2 by RT PCR NEGATIVE NEGATIVE Final    Comment: (NOTE) SARS-CoV-2 target nucleic acids are NOT DETECTED.  The SARS-CoV-2 RNA is generally detectable in upper respiratory specimens during the acute phase of infection. The lowest concentration of SARS-CoV-2 viral copies this assay can detect is 138 copies/mL. A negative result does not preclude SARS-Cov-2 infection and should not be used as the sole basis for treatment or other patient management decisions. A negative result may occur with  improper specimen  collection/handling, submission of specimen other than nasopharyngeal swab, presence of viral mutation(s) within the areas targeted by this assay, and inadequate number of viral copies(<138 copies/mL). A negative result must be combined with clinical observations, patient history, and epidemiological information. The expected result is Negative.  Fact Sheet for Patients:  BloggerCourse.com  Fact Sheet for Healthcare Providers:  SeriousBroker.it  This test is no t yet approved or cleared by the [place] FDA and  has been authorized for detection and/or diagnosis of SARS-CoV-2 by FDA under an Emergency Use Authorization (EUA). This EUA will remain  in effect (meaning this test can be used) for the duration of the COVID-19 declaration under Section 564(b)(1) of the Act, 21 U.S.C.section 360bbb-3(b)(1), unless the authorization is terminated  or revoked sooner.       Influenza A by PCR NEGATIVE NEGATIVE Final   Influenza B by PCR NEGATIVE NEGATIVE Final    Comment: (NOTE) The Xpert Xpress SARS-CoV-2/FLU/RSV plus assay is intended as an aid in the diagnosis of influenza from Nasopharyngeal swab specimens and should not be used as a sole basis for treatment. Nasal washings and aspirates are unacceptable for Xpert Xpress SARS-CoV-2/FLU/RSV testing.  Fact Sheet for Patients: BloggerCourse.com  Fact Sheet for Healthcare Providers: SeriousBroker.it  This test is not yet approved or cleared by the [place] FDA and has been authorized for detection and/or diagnosis of SARS-CoV-2 by FDA under an Emergency Use Authorization (EUA). This EUA will remain in effect (meaning this test can be used) for the duration of the COVID-19 declaration under Section 564(b)(1) of the Act, 21 U.S.C. section 360bbb-3(b)(1), unless the authorization is terminated or revoked.     Resp Syncytial  Virus by PCR NEGATIVE NEGATIVE Final    Comment: (NOTE) Fact Sheet for Patients: BloggerCourse.com  Fact Sheet for Healthcare Providers: SeriousBroker.it  This test is not yet approved or cleared by the [place] FDA and has been authorized for detection and/or diagnosis of SARS-CoV-2 by FDA under an Emergency Use Authorization (EUA). This EUA will remain in effect (meaning this test can be used) for the duration of the COVID-19 declaration under Section 564(b)(1) of the Act, 21 U.S.C. section 360bbb-3(b)(1), unless the authorization is terminated or revoked.  Performed at [facility], [address] [place], [place] [address]     Coagulation Studies: No results for input(s): "LABPROT", "INR" in the last 72 hours.  Urinalysis: Recent Labs    09/13/22 1716  COLORURINE YELLOW*  LABSPEC 1.010  PHURINE 5.0  GLUCOSEU NEGATIVE  HGBUR NEGATIVE  BILIRUBINUR NEGATIVE  KETONESUR NEGATIVE  PROTEINUR NEGATIVE  NITRITE NEGATIVE  LEUKOCYTESUR NEGATIVE       Imaging: No results found.   Medications:    sodium chloride     anticoagulant sodium citrate      acetaminophen  500 mg Oral QHS   allopurinol  100 mg Oral Daily   amiodarone  200 mg Oral Daily   apixaban  5 mg  Oral BID   atorvastatin  40 mg Oral QHS   Chlorhexidine Gluconate Cloth  6 each Topical Q0600   epoetin (EPOGEN/PROCRIT) injection  4,000 Units Subcutaneous Q T,Th,Sa-HD   levothyroxine  75 mcg Oral Q0600   metoprolol tartrate  50 mg Oral BID   pantoprazole  40 mg Oral BID   polyethylene glycol  17 g Oral QHS   sodium bicarbonate  650 mg Oral BID   sodium chloride flush  3 mL Intravenous Q12H   sodium chloride flush  3 mL Intravenous Q12H   sodium chloride, alteplase, anticoagulant sodium citrate, heparin, lidocaine (PF), lidocaine-prilocaine, morphine injection, pentafluoroprop-tetrafluoroeth, sodium chloride flush  Assessment/ Plan:   Ronald [name] is a 77 y.o.  male with past medical conditions including CAD, type 2 diabetes, emphysema, GERD, hyperlipidemia, hypertension, AAA, and end-stage renal disease on hemodialysis.  Patient presents to the emergency department with complaints of leg swelling and has been admitted under observation for Peripheral edema [R60.0] DOE (dyspnea on exertion) [R06.09] 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] Bilateral leg edema [R60.0]  [facility]/TTS/right chest PermCath  Fluid volume overload with end-stage renal disease on hemodialysis.  Patient recently transition from peritoneal to hemodialysis due to intolerance.   Patient receiving third, consecutive treatment. UF goal 2.5L as tolerated. Next treatment scheduled for Saturday. If stable, patient cleared to discharge from renal stance after today's treatment.   2. Anemia of chronic kidney disease Lab Results  Component Value Date   HGB 8.7 (L) 09/16/2022    Hemoglobin below target.  Will receive low-dose EPO with dialysis.  3. Secondary Hyperparathyroidism: with outpatient labs:  phosphorus 3.9, calcium 8.9 on 08/26/22.   Lab Results  Component Value Date   CALCIUM 8.7 (L) 09/16/2022   PHOS 3.8 09/15/2022    Bone minerals acceptable.   4.  Hypertension with chronic kidney disease. Home regimen includes amiodarone, furosemide, isosorbide, and metoprolol.  Currently receiving amiodarone and metoprolol.  Patient received 1 dose IV furosemide 80 mg in ED.  Blood pressure stable during dialysis    LOS: 2   4/18/202411:11 AM

## 2022-09-16 NOTE — Discharge Summary (Addendum)
Physician Discharge Summary   Patient: Ronald [name] MRN: [id number] DOB: 1946/05/05  Admit date:     09/13/2022  Discharge date: 09/16/22  Discharge Physician: Arthur Speagle   PCP: Reubin Milan, MD   Recommendations at discharge:   Keep scheduled dialysis appointments  Discharge Diagnoses: Principal Problem:   Acute on chronic combined systolic (congestive) and diastolic (congestive) heart failure Active Problems:   Essential hypertension   ESRD on dialysis   Anemia of renal disease   Paroxysmal atrial fibrillation   Coronary artery disease   Hypothyroidism   Acid reflux   Bilateral leg edema   Physical deconditioning  Resolved Problems:   * No resolved hospital problems. Navos Course:  Ronald [name] is an 77 y.o. male brought for bilateral lower extremity edema.  Patient has end-stage renal disease stage and was started on peritoneal dialysis few week ago and due to intolerance due to abdominal pain patient had a PermCath placed. Patient is followed by Dr. Cherylann Ratel, nephrologist.  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 Patient was last admitted in February 2024 for chest pain where he received heparin gtt. per note no cardiac cath given patient's end-stage renal disease.  Nephrology was consulted for end-stage renal disease stage IV at that time.  Patient  does make some urine.  He is on amiodarone and Eliquis for his atrial fibrillation and Synthroid for his hypothyroidism. Patient also has chronic stable anemia with baseline hemoglobin of 9, but also intermittent thrombocytopenia that has been chronic. Today patient comes with swelling that is been progressively getting worse he supposed to  have hemodialysis tomorrow.  Patient reports shortness of breath and is not hypoxic on room air. Chart review shows that patient right internal jugular dialysis catheter placed by Dr. Gilda Crease on 10 September 2022.  On arrival patient also had complaints of generalized abdominal pain not present on my exam.     Assessment and Plan:  Acute on chronic combined systolic (congestive) and diastolic (congestive) heart failure Patient presented with with increasing lower extremity edema and shortness of breath.   Last 2D echocardiogram from 12/23 shows an LVEF of 35 to 40% with moderately decreased LV function and LV global hypokinesis as well as grade 2 diastolic dysfunction. Patient received renal replacement therapy for management of his volume overload and his symptoms have improved He will be discharged home to continue his dialysis as an outpatient    Essential hypertension Metoprolol dose was decreased.  Continue Imdur.  Blood pressure seems to be stable at this time.   Coronary artery disease Continue Imdur, statin and metoprolol.  No acute issues at this time  Paroxysmal atrial fibrillation Initial EKG showed sinus rhythm.  Continue amiodarone/ metoprolol and eliquis.   Hypothyroidism Continue Synthroid.   GERD.  Continue PPI      Consultants: Nephrology Procedures performed: Renal replacement therapy Disposition: Home health Diet recommendation:  Discharge Diet Orders (From admission, onward)     Start     Ordered   09/16/22 0000  Diet - low sodium heart healthy        09/16/22 1334           Renal diet DISCHARGE MEDICATION: Allergies as  of 09/16/2022       Reactions   Predicort [prednisolone] Other (See Comments)   Stomach pain   Ciprofloxacin Other (See Comments)   GI upset   Hydrochlorothiazide Other (See Comments)   Dehydration   Hydrocodone Nausea Only, Other (See Comments)   Stomach upset   Hydrocodone-acetaminophen Nausea Only   Stomach upset   Sulfa  Antibiotics Other (See Comments)   Cannot recall   Penicillins Hives, Rash, Other (See Comments)   Has patient had a PCN reaction causing immediate rash, facial/tongue/throat swelling, SOB or lightheadedness with hypotension: YES Has patient had a PCN reaction causing severe rash involving mucus membranes or skin necrosis: NO Has patient had a PCN reaction that required hospitalization NO Has patient had a PCN reaction occurring within the last 10 years:NO If all of the above answers are "NO", then may proceed with Cephalosporin use. Has patient had a PCN reaction causing immediate rash, facial/tongue/throat swelling, SOB or lightheadedness with hypotension: YES Has patient had a PCN reaction causing severe rash involving mucus membranes or skin necrosis: NO Has patient had a PCN reaction that required hospitalization NO Has patient had a PCN reaction occurring within the last 10 years:NO If all of the above answers are "NO", then may proceed with Cephalosporin use.        Medication List     STOP taking these medications    ibuprofen 400 MG tablet Commonly known as: ADVIL       TAKE these medications    acetaminophen 650 MG CR tablet Commonly known as: TYLENOL Take 1,300 mg by mouth at bedtime.   allopurinol 100 MG tablet Commonly known as: ZYLOPRIM TAKE 1 TABLET BY MOUTH EVERYDAY AT BEDTIME   amiodarone 200 MG tablet Commonly known as: PACERONE Take 200 mg by mouth daily.   apixaban 5 MG Tabs tablet Commonly known as: ELIQUIS Take 1 tablet (5 mg total) by mouth 2 (two) times daily.   atorvastatin 40 MG tablet Commonly known as: LIPITOR Take 1 tablet (40 mg total) by mouth at bedtime.   diphenhydrAMINE 25 mg capsule Commonly known as: BENADRYL Take 25 mg by mouth every 6 (six) hours as needed.   furosemide 40 MG tablet Commonly known as: LASIX Take 1.5 tablets (60 mg total) by mouth daily. Increased from 40 mg.   isosorbide mononitrate 60 MG 24 hr  tablet Commonly known as: IMDUR Take 1 tablet (60 mg total) by mouth daily. Increased from 30 mg.   levothyroxine 75 MCG tablet Commonly known as: SYNTHROID Take 1 tablet (75 mcg total) by mouth daily before breakfast.   metoprolol tartrate 50 MG tablet Commonly known as: LOPRESSOR Take 1 tablet (50 mg total) by mouth 2 (two) times daily. What changed:  medication strength how much to take additional instructions   MULTIVITAMIN PO Take 1 tablet by mouth daily.   nitroGLYCERIN 0.4 MG SL tablet Commonly known as: NITROSTAT Place 0.4 mg under the tongue every 5 (five) minutes as needed for chest pain (Up to 3 times).   pantoprazole 40 MG tablet Commonly known as: PROTONIX Take 1 tablet (40 mg total) by mouth 2 (two) times daily. Home med.   polyethylene glycol 17 g packet Commonly known as: MIRALAX / GLYCOLAX Take 17 g by mouth at bedtime.   senna 8.6 MG tablet Commonly known as: SENOKOT Take 2 tablets by mouth 2 (two) times daily.   sodium bicarbonate 650 MG tablet Take 650 mg by mouth 2 (two) times daily.  Vitamin D-3 125 MCG (5000 UT) Tabs Take 2,000 Units by mouth daily.               Durable Medical Equipment  (From admission, onward)           Start     Ordered   09/16/22 1330  For home use only DME Walker rolling  Once       Question Answer Comment  Walker: With 5 Inch Wheels   Patient needs a walker to treat with the following condition Weakness generalized      09/16/22 1330              Discharge Care Instructions  (From admission, onward)           Start     Ordered   09/16/22 0000  No dressing needed        09/16/22 1334            Discharge Exam: Filed Weights   09/16/22 0500 09/16/22 0739 09/16/22 1130  Weight: 104.3 kg 103.5 kg 100.8 kg  Body mass index is 34.77 kg/m.    General: Alert awake and Communicative, obese built, elderly male. HENT:   No scleral pallor or icterus noted. Oral mucosa is moist.  Right  IJ hemodialysis catheter in place Chest:  .  Diminished breath sounds bilaterally. No crackles or wheezes.  CVS: S1 &S2 heard. No murmur.  Regular rate and rhythm. Abdomen: Soft, nontender, nondistended.  Peritoneal dialysis catheter in place with dressing, nonspecific tenderness palpation, bowel sounds are heard.   Extremities: No cyanosis, clubbing with gross bilateral pitting edema, peripheral pulses are palpable. Psych: Alert, awake and oriented, normal mood CNS:  No cranial nerve deficits.  Power equal in all extremities.   Skin: Warm and dry.  No rashes noted.   Condition at discharge: stable  The results of significant diagnostics from this hospitalization (including imaging, microbiology, ancillary and laboratory) are listed below for reference.   Imaging Studies: CT CHEST ABDOMEN PELVIS WO CONTRAST  Result Date: 09/13/2022 CLINICAL DATA:  Shortness of breath, fluid overload, leg swelling, abdominal distension. Recent peritoneal dialysis, anticipation of starting hemodialysis tomorrow. Lower abdominal pain. EXAM: CT CHEST, ABDOMEN AND PELVIS WITHOUT CONTRAST TECHNIQUE: Multidetector CT imaging of the chest, abdomen and pelvis was performed following the standard protocol without IV contrast. RADIATION DOSE REDUCTION: This exam was performed according to the departmental dose-optimization program which includes automated exposure control, adjustment of the mA and/or kV according to patient size and/or use of iterative reconstruction technique. COMPARISON:  02/12/2021 FINDINGS: CT CHEST FINDINGS Cardiovascular: Hemodialysis catheter noted with tip projecting over the cavoatrial junction, recently placed on 09/10/2022. Miniscule amount of gas along the lower platysma muscle on the right is not unexpected in this setting, and no drainable abscess or similar complicating feature is observed. Prior CABG. Coronary, aortic arch, and branch vessel atherosclerotic vascular disease. Moderate  cardiomegaly. Low-density blood pool compatible with anemia. Mediastinum/Nodes: Unremarkable Lungs/Pleura: Small bilateral pleural effusions with associated passive atelectasis. Scarring at the right lung apex. Patchy ground-glass opacities favoring the upper lobes, nonspecific for edema versus atypical/viral pneumonia although I favor the former. Mild paraseptal emphysema. Musculoskeletal: Mild thoracic spondylosis. CT ABDOMEN PELVIS FINDINGS Hepatobiliary: Cholecystectomy.  Otherwise unremarkable. Pancreas: Stable punctate calcification along the pancreatic head on image 69 series 2, likely postinflammatory. No acute pancreatic findings. Spleen: Unremarkable Adrenals/Urinary Tract: Both adrenal glands appear normal. Stable benign bilateral renal cysts warranting no further workup. Bilateral renal atrophy. Stomach/Bowel: Unremarkable Vascular/Lymphatic: Atherosclerosis  is present, including aortoiliac atherosclerotic disease. There is notable atheromatous plaque proximally in the celiac trunk and SMA, patency not a stab list on today's noncontrast exam. Infrarenal abdominal aortic aneurysm 3.1 cm in anterior-posterior dimension on image 75 series 2, stable. Reproductive: Unremarkable Other: Small amount of ascites primarily in the vicinity of the peritoneal dialysis catheter. Trace retroperitoneal edema bilaterally along with bilateral flank edema in the subcutaneous tissues. Small amount of subcutaneous edema along the inferior margin of the pannus. Musculoskeletal: Mild grade 1 degenerative anterolisthesis at L4-5 with bilateral degenerative facet arthropathy at this level. Small umbilical hernia contains adipose tissue. IMPRESSION: 1. Small bilateral pleural effusions with associated passive atelectasis. 2. Patchy ground-glass opacities favoring the upper lobes, nonspecific for edema versus atypical/viral pneumonia although I favor the former. 3. Moderate cardiomegaly. Low-density blood pool compatible with  anemia. 4. Small amount of ascites primarily in the vicinity of the peritoneal dialysis catheter. 5. Infrarenal abdominal aortic aneurysm 3.1 cm in anterior-posterior dimension, stable. Recommend follow up ultrasound every 3 years. 6. Bilateral renal atrophy. 7. Small umbilical hernia contains adipose tissue. 8. Aortic atherosclerosis. 9. Emphysema. 10. Recently placed hemodialysis catheter appear satisfactorily positioned. Aortic Atherosclerosis (ICD10-I70.0) and Emphysema (ICD10-J43.9). Electronically Signed   By: Gaylyn Rong M.D.   On: 09/13/2022 17:08   PERIPHERAL VASCULAR CATHETERIZATION  Result Date: 09/10/2022 See surgical note for result.   Microbiology: Results for orders placed or performed during the hospital encounter of 05/28/22  Resp panel by RT-PCR (RSV, Flu A&B, Covid) Anterior Nasal Swab     Status: None   Collection Time: 05/28/22  8:19 PM   Specimen: Anterior Nasal Swab  Result Value Ref Range Status   SARS Coronavirus 2 by RT PCR NEGATIVE NEGATIVE Final    Comment: (NOTE) SARS-CoV-2 target nucleic acids are NOT DETECTED.  The SARS-CoV-2 RNA is generally detectable in upper respiratory specimens during the acute phase of infection. The lowest concentration of SARS-CoV-2 viral copies this assay can detect is 138 copies/mL. A negative result does not preclude SARS-Cov-2 infection and should not be used as the sole basis for treatment or other patient management decisions. A negative result may occur with  improper specimen collection/handling, submission of specimen other than nasopharyngeal swab, presence of viral mutation(s) within the areas targeted by this assay, and inadequate number of viral copies(<138 copies/mL). A negative result must be combined with clinical observations, patient history, and epidemiological information. The expected result is Negative.  Fact Sheet for Patients:  BloggerCourse.com  Fact Sheet for Healthcare  Providers:  SeriousBroker.it  This test is no t yet approved or cleared by the [place] FDA and  has been authorized for detection and/or diagnosis of SARS-CoV-2 by FDA under an Emergency Use Authorization (EUA). This EUA will remain  in effect (meaning this test can be used) for the duration of the COVID-19 declaration under Section 564(b)(1) of the Act, 21 U.S.C.section 360bbb-3(b)(1), unless the authorization is terminated  or revoked sooner.       Influenza A by PCR NEGATIVE NEGATIVE Final   Influenza B by PCR NEGATIVE NEGATIVE Final    Comment: (NOTE) The Xpert Xpress SARS-CoV-2/FLU/RSV plus assay is intended as an aid in the diagnosis of influenza from Nasopharyngeal swab specimens and should not be used as a sole basis for treatment. Nasal washings and aspirates are unacceptable for Xpert Xpress SARS-CoV-2/FLU/RSV testing.  Fact Sheet for Patients: BloggerCourse.com  Fact Sheet for Healthcare Providers: SeriousBroker.it  This test is not yet approved or cleared by the  [place] [employer] trader and has been authorized for detection and/or diagnosis of SARS-CoV-2 by FDA under an TEFL teacher (EUA). This EUA will remain in effect (meaning this test can be used) for the duration of the COVID-19 declaration under Section 564(b)(1) of the Act, 21 U.S.C. section 360bbb-3(b)(1), unless the authorization is terminated or revoked.     Resp Syncytial Virus by PCR NEGATIVE NEGATIVE Final    Comment: (NOTE) Fact Sheet for Patients: BloggerCourse.com  Fact Sheet for Healthcare Providers: SeriousBroker.it  This test is not yet approved or cleared by the [place] FDA and has been authorized for detection and/or diagnosis of SARS-CoV-2 by FDA under an Emergency Use Authorization (EUA). This EUA will remain in effect (meaning this test can be  used) for the duration of the COVID-19 declaration under Section 564(b)(1) of the Act, 21 U.S.C. section 360bbb-3(b)(1), unless the authorization is terminated or revoked.  Performed at [facility], [address] [place], [place] [address]     Labs: CBC: Recent Labs  Lab 09/13/22 1429 09/14/22 0451 09/15/22 0456 09/16/22 0547  WBC 6.8 6.1 4.9 5.4  HGB 8.6* 8.3* 8.2* 8.7*  HCT 28.2* 27.1* 27.0* 28.3*  MCV 87.6 88.3 85.7 84.5  PLT 143* 131* 126* 107*   Basic Metabolic Panel: Recent Labs  Lab 09/13/22 1429 09/14/22 0451 09/15/22 0456 09/16/22 0547  NA 144 146* 141 137  K 3.8 3.6 3.8 3.2*  CL 108 109 106 99  CO2 GLUCOSE 129* 110* 105* 104*  BUN 90* 93* 55* 32*  CREATININE 5.02* 4.82* 3.78* 3.12*  CALCIUM 8.8* 8.7* 8.8* 8.7*  MG  --   --  2.2 1.9  PHOS  --   --  3.8  --    Liver Function Tests: Recent Labs  Lab 09/13/22 1429 09/14/22 0451  AST 53* 51*  ALT 46* 43  ALKPHOS 140* 116  BILITOT 1.3* 1.6*  PROT 5.8* 5.5*  ALBUMIN 2.8* 2.9*   CBG: Recent Labs  Lab 09/10/22 1339  GLUCAP 101*    Discharge time spent: greater than 30 minutes.  Signed: Lucile Shutters, MD [facility] 09/16/2022

## 2022-09-16 NOTE — Evaluation (Signed)
Physical Therapy Evaluation Patient Details Name: Ronald [name] [facility] MRN: [id number] DOB: 07-Feb-1946 Today's Date: 09/16/2022  History of Present Illness  Pt is a 77 y.o. Caucasian male with medical history significant for combined systolic and diastolic CHF, coronary artery disease s/p CABG, s/p PCI and stents, COPD, paroxysmal atrial fibrillation on Eliquis, stage IV chronic kidney disease, hypertension, dyslipidemia, chronic low back pain and type 2 diabetes mellitus, who presented to the emergency room with leg swelling and DOE.   Clinical Impression  Patient alert, agreeable to PT, family at bedside. Do to recent deconditioning and leg swelling, pt has needed more assistance with lower body dressing, has had 3-4 falls since November 2023, and has had a decline in mobility.   He was able to perform bed mobility with supervision. Sit <> stand with RW and CGA, instructed in hand placement with good carryover. He ambulated ~98ft with RW and CGA quickly with a flexed trunk, no unsteadiness noted. He did report it was easier to ambulate with the assistance of the RW.  Overall the patient demonstrated deficits (see "PT Problem List") that impede the patient's functional abilities, safety, and mobility and would benefit from skilled PT intervention. Recommendation at this time is continued skilled PT services to maximize function and safety.        Recommendations for follow up therapy are one component of a multi-disciplinary discharge planning process, led by the attending physician.  Recommendations may be updated based on patient status, additional functional criteria and insurance authorization.  Follow Up Recommendations       Assistance Recommended at Discharge Intermittent Supervision/Assistance  Patient can return home with the following  Assistance with cooking/housework;Direct supervision/assist for medications management;Assist for transportation;Help with stairs or ramp for entrance;A  little help with bathing/dressing/bathroom    Equipment Recommendations Rolling walker (2 wheels)  Recommendations for Other Services       Functional Status Assessment Patient has had a recent decline in their functional status and demonstrates the ability to make significant improvements in function in a reasonable and predictable amount of time.     Precautions / Restrictions Precautions Precautions: Fall Restrictions Weight Bearing Restrictions: No      Mobility  Bed Mobility Overal bed mobility: Needs Assistance Bed Mobility: Supine to Sit, Sit to Supine     Supine to sit: Supervision Sit to supine: Supervision        Transfers Overall transfer level: Needs assistance Equipment used: Rolling walker (2 wheels) Transfers: Sit to/from Stand Sit to Stand: Min guard           General transfer comment: cued for hand placement    Ambulation/Gait Ambulation/Gait assistance: Min guard Gait Distance (Feet): 70 Feet Assistive device: Rolling walker (2 wheels)         General Gait Details: no LOB noted, flexed trunk  Stairs            Wheelchair Mobility    Modified Rankin (Stroke Patients Only)       Balance Overall balance assessment: Needs assistance Sitting-balance support: Feet supported Sitting balance-Leahy Scale: Good     Standing balance support: Bilateral upper extremity supported Standing balance-Leahy Scale: Poor                               Pertinent Vitals/Pain Pain Assessment Pain Assessment: No/denies pain    Home Living Family/patient expects to be discharged to:: Private residence Living Arrangements: Spouse/significant other Available Help at  Discharge: Family;Available 24 hours/day Type of Home: House Home Access: Stairs to enter Entrance Stairs-Rails: Doctor, general practice of Steps: 5   Home Layout: One level Home Equipment: BSC/3in1;Cane - quad Additional Comments: 3-4 recent falls  since November 2023    Prior Function Prior Level of Function : Independent/Modified Independent;Driving             Mobility Comments: limited in mobility lately ADLs Comments: wife has assisted the last few weeks with lower body dressing     Hand Dominance        Extremity/Trunk Assessment   Upper Extremity Assessment Upper Extremity Assessment: Generalized weakness    Lower Extremity Assessment Lower Extremity Assessment: Generalized weakness    Cervical / Trunk Assessment Cervical / Trunk Assessment: Kyphotic  Communication      Cognition Arousal/Alertness: Awake/alert Behavior During Therapy: WFL for tasks assessed/performed Overall Cognitive Status: Within Functional Limits for tasks assessed                                          General Comments      Exercises     Assessment/Plan    PT Assessment Patient needs continued PT services  PT Problem List Decreased mobility;Decreased strength;Decreased range of motion;Decreased activity tolerance;Decreased balance       PT Treatment Interventions DME instruction;Therapeutic activities;Therapeutic exercise;Gait training;Patient/family education;Stair training;Balance training;Functional mobility training;Neuromuscular re-education    PT Goals (Current goals can be found in the Care Plan section)  Acute Rehab PT Goals Patient Stated Goal: to get stronger PT Goal Formulation: With patient Time For Goal Achievement: 09/30/22 Potential to Achieve Goals: Good    Frequency Min 3X/week     Co-evaluation               AM-PAC PT "6 Clicks" Mobility  Outcome Measure Help needed turning from your back to your side while in a flat bed without using bedrails?: None Help needed moving from lying on your back to sitting on the side of a flat bed without using bedrails?: None Help needed moving to and from a bed to a chair (including a wheelchair)?: None Help needed standing up from a  chair using your arms (e.g., wheelchair or bedside chair)?: None Help needed to walk in hospital room?: A Little Help needed climbing 3-5 steps with a railing? : A Little 6 Click Score: 22    End of Session Equipment Utilized During Treatment: Gait belt Activity Tolerance: Patient tolerated treatment well Patient left: in bed;with call bell/phone within reach;with bed alarm set;with family/visitor present Nurse Communication: Mobility status PT Visit Diagnosis: Other abnormalities of gait and mobility (R26.89);Muscle weakness (generalized) (M62.81)    Time: 4765-4650 PT Time Calculation (min) (ACUTE ONLY): 18 min   Charges:   PT Evaluation $PT Eval Low Complexity: 1 Low PT Treatments $Therapeutic Activity: 8-22 mins        Olga Coaster PT, DPT 3:42 PM,09/16/22

## 2022-09-16 NOTE — TOC Initial Note (Signed)
Transition of Care [facility]) - Initial/Assessment Note    Patient Details  Name: Ronald [name] MRN: [id number] Date of Birth: 10-11-1945  Transition of Care [facility]) CM/SW Contact:    Chapman Fitch, RN Phone Number: 09/16/2022, 2:39 PM  Clinical Narrative:                 Patient to discharge today Wife Pam at bedside to Kathyrn Sheriff with nephrology to notify clinic of discharge.  Wife requesting change in HD schedule.  Per shantelle patient will have to request time change at the clinic.  Bedside RN to notify patient and wife  Therapy recommending home health Patient in agreement and states he does not have a preferance of home health agnecy.  Referral made to [facility] with [facility]  RW referral made to [facility] with [facility] for RW to be delivered to room prior to discharge        Patient Goals and CMS Choice            Expected Discharge Plan and Services         Expected Discharge Date: 09/16/22                                    Prior Living Arrangements/Services                       Activities of Daily Living Home Assistive Devices/Equipment: Other (Comment) (PD) ADL Screening (condition at time of admission) Patient's cognitive ability adequate to safely complete daily activities?: Yes Is the patient deaf or have difficulty hearing?: Yes Does the patient have difficulty seeing, even when wearing glasses/contacts?: No Does the patient have difficulty concentrating, remembering, or making decisions?: No Patient able to express need for assistance with ADLs?: Yes Does the patient have difficulty dressing or bathing?: Yes Independently performs ADLs?: No Communication: Independent Dressing (OT): Independent Grooming: Independent Feeding: Independent Bathing: Independent Toileting: Needs assistance Is this a change from baseline?: Change from baseline, expected to last <3 days In/Out Bed: Needs assistance Is this a change from  baseline?: Change from baseline, expected to last <3 days Walks in Home: Needs assistance Is this a change from baseline?: Change from baseline, expected to last <3 days Does the patient have difficulty walking or climbing stairs?: Yes Weakness of Legs: Both Weakness of Arms/Hands: None  Permission Sought/Granted                  Emotional Assessment              Admission diagnosis:  Peripheral edema [R60.0] DOE (dyspnea on exertion) [R06.09] 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] Bilateral leg edema [R60.0] Patient Active Problem List   Diagnosis Date Noted   Bilateral leg edema 09/13/2022   Hyp chr kidney disease w stage 5 chr kidney disease or ESRD 07/22/2022   Coronary artery disease of native artery of native heart with stable angina pectoris 07/22/2022   OSA (obstructive sleep apnea) 07/22/2022   Angina pectoris 07/17/2022   Unstable angina 07/15/2022   Chronic systolic CHF (congestive heart failure) 07/15/2022   Typical atrial flutter 07/15/2022   Acute on chronic combined systolic (congestive) and diastolic (congestive) heart failure 05/28/2022   Hypothyroidism 05/28/2022   Coronary artery disease 05/28/2022   GERD without esophagitis 05/28/2022   Essential hypertension 05/05/2022   Prediabetes 04/06/2022   Spinal stenosis of lumbar region without  neurogenic claudication 04/06/2022   Weakness of both lower extremities 04/06/2022   Morbid obesity 04/06/2022   Upper airway cough syndrome 10/01/2021   Hyperparathyroidism due to renal insufficiency 05/18/2019   Bursitis of hip 12/18/2018   Paroxysmal atrial fibrillation 06/10/2018   Unspecified atrial fibrillation 06/10/2018   CKD stage 5 secondary to hypertension 07/29/2017   Atherosclerosis of native arteries of extremity with intermittent claudication 06/25/2016   Atherosclerosis of native arteries of extremities with intermittent claudication, unspecified extremity 06/25/2016   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   Coronary artery disease involving native coronary artery with angina pectoris    Allergic rhinitis 08/19/2015   Myocardial infarction 12/13/2014   Anemia of renal disease 10/04/2014   AA (aortic aneurysm) 10/04/2014   Acid reflux 10/04/2014   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   Basal cell papilloma 10/04/2014   Unspecified osteoarthritis, unspecified site 10/04/2014   Thoracic aortic aneurysm (TAA) 10/04/2014   Atherosclerotic heart disease of native coronary artery without angina pectoris 10/04/2014   Hypertensive heart disease without CHF 12/30/2012   Benign prostatic hyperplasia with lower urinary tract symptoms 10/24/2012   DOE (dyspnea on exertion) 11/12/2009   Renal artery stenosis     Mixed hyperlipidemia 09/04/2008   History of redo bypass grafting 09/04/2008   PCP:  Reubin Milan, MD Pharmacy:   [facility] [address] [place] - [address] [place] [address] Phone: [phone] Fax: [phone]  [facility] - [place], [place] - [address] [place] [address] Phone: [phone] Fax: [phone]     Social Determinants of Health (SDOH) Social History: SDOH Screenings   Food Insecurity: No Food Insecurity (09/14/2022)  Housing: Low Risk  (09/14/2022)  Transportation Needs: No Transportation Needs (09/14/2022)  Utilities: Not At Risk (09/14/2022)  Alcohol Screen: Low Risk  (10/28/2021)  Depression (PHQ2-9): High Risk (07/26/2022)  Financial Resource Strain: Low Risk  (06/25/2022)  Physical Activity: Inactive (06/25/2022)  Social Connections: Moderately Isolated (06/25/2022)  Stress: No Stress Concern Present (06/25/2022)  Tobacco Use: Medium Risk (09/14/2022)   SDOH Interventions:     Readmission Risk Interventions     No data to display

## 2022-09-16 NOTE — Progress Notes (Signed)
Discharge complete. Iv removed. Ronald [name] delivered from DME> Patient wheeled to the exit by volunteers in stable condition.  Cornell Barman Osiah Haring

## 2022-09-16 NOTE — Assessment & Plan Note (Signed)
Secondary to acute illness Appreciate PT input They recommend home health PT upon discharge

## 2022-09-16 NOTE — Care Management Important Message (Signed)
Important Message  Patient Details  Name: Ronald [name] [facility] MRN: [id number] Date of Birth: [birth year]   Medicare Important Message Given:  N/A - LOS <3 / Initial given by admissions     [name] 09/16/2022, 2:55 PM

## 2022-09-17 ENCOUNTER — Telehealth: Payer: Self-pay | Admitting: *Deleted

## 2022-09-17 NOTE — Transitions of Care (Post Inpatient/ED Visit) (Signed)
   09/17/2022  Name: Ronald [name] [facility] MRN: [id number] DOB: Nov 17, 1945  Today's TOC FU Call Status: Today's TOC FU Call Status:: Successful TOC FU Call Competed TOC FU Call Complete Date: 09/17/22  Transition Care Management Follow-up Telephone Call Date of Discharge: 09/16/22 Discharge Facility: [facility]) Type of Discharge: Inpatient Admission Primary Inpatient Discharge Diagnosis:: Bilateral leg edema How have you been since you were released from the hospital?: Same (still very weak) Any questions or concerns?: No  Items Reviewed: Did you receive and understand the discharge instructions provided?: Yes Medications obtained and verified?: Yes (Medications Reviewed) Any new allergies since your discharge?: No Dietary orders reviewed?: No Do you have support at home?: Yes People in Home: spouse Name of Support/Comfort Primary Source: [facility] and Equipment/Supplies: Were Home Health Services Ordered?: Yes Name of Home Health Agency:: [facility] Has Agency set up a time to come to your home?: Yes First Home Health Visit Date: 09/20/22 Any new equipment or medical supplies ordered?: Yes Name of Medical supply agency?: [employer] (RW) Were you able to get the equipment/medical supplies?: Yes Do you have any questions related to the use of the equipment/supplies?: No  Functional Questionnaire: Do you need assistance with bathing/showering or dressing?: Yes Do you need assistance with meal preparation?: Yes Do you need assistance with eating?: No Do you have difficulty maintaining continence: No Do you need assistance with getting out of bed/getting out of a chair/moving?: No Do you have difficulty managing or taking your medications?: No  Follow up appointments reviewed: PCP Follow-up appointment confirmed?: No MD Provider Line Number:[phone] Given: Yes (wife will call and schedule Dr appt around dialysis) [facility] Follow-up  appointment confirmed?: NA Do you need transportation to your follow-up appointment?: No Do you understand care options if your condition(s) worsen?: Yes-patient verbalized understanding  SDOH Interventions Today    Flowsheet Row Most Recent Value  SDOH Interventions   Food Insecurity Interventions Intervention Not Indicated  Housing Interventions Intervention Not Indicated  Transportation Interventions Intervention Not Indicated      Interventions Today    Flowsheet Row Most Recent Value  General Interventions   General Interventions Discussed/Reviewed General Interventions Discussed, General Interventions Reviewed, Doctor Visits      TOC Interventions Today    Flowsheet Row Most Recent Value  TOC Interventions   TOC Interventions Discussed/Reviewed TOC Interventions Discussed, TOC Interventions Reviewed  [RN discussed patient wife to follow up withdialysis clinic to change dialysis days and to talk about how long the treatment will be.]       Gean Maidens BSN RN [facility] [phone]

## 2022-09-18 DIAGNOSIS — Z992 Dependence on renal dialysis: Secondary | ICD-10-CM | POA: Diagnosis not present

## 2022-09-18 DIAGNOSIS — N186 End stage renal disease: Secondary | ICD-10-CM | POA: Diagnosis not present

## 2022-09-19 ENCOUNTER — Encounter: Payer: Self-pay | Admitting: Cardiovascular Disease

## 2022-09-20 DIAGNOSIS — Z992 Dependence on renal dialysis: Secondary | ICD-10-CM | POA: Diagnosis not present

## 2022-09-20 DIAGNOSIS — I255 Ischemic cardiomyopathy: Secondary | ICD-10-CM | POA: Diagnosis not present

## 2022-09-20 DIAGNOSIS — K219 Gastro-esophageal reflux disease without esophagitis: Secondary | ICD-10-CM | POA: Diagnosis not present

## 2022-09-20 DIAGNOSIS — M1990 Unspecified osteoarthritis, unspecified site: Secondary | ICD-10-CM | POA: Diagnosis not present

## 2022-09-20 DIAGNOSIS — I5043 Acute on chronic combined systolic (congestive) and diastolic (congestive) heart failure: Secondary | ICD-10-CM | POA: Diagnosis not present

## 2022-09-20 DIAGNOSIS — M5136 Other intervertebral disc degeneration, lumbar region: Secondary | ICD-10-CM | POA: Diagnosis not present

## 2022-09-20 DIAGNOSIS — M545 Low back pain, unspecified: Secondary | ICD-10-CM | POA: Diagnosis not present

## 2022-09-20 DIAGNOSIS — Z7901 Long term (current) use of anticoagulants: Secondary | ICD-10-CM | POA: Diagnosis not present

## 2022-09-20 DIAGNOSIS — D631 Anemia in chronic kidney disease: Secondary | ICD-10-CM | POA: Diagnosis not present

## 2022-09-20 DIAGNOSIS — N186 End stage renal disease: Secondary | ICD-10-CM | POA: Diagnosis not present

## 2022-09-20 DIAGNOSIS — I252 Old myocardial infarction: Secondary | ICD-10-CM | POA: Diagnosis not present

## 2022-09-20 DIAGNOSIS — E1122 Type 2 diabetes mellitus with diabetic chronic kidney disease: Secondary | ICD-10-CM | POA: Diagnosis not present

## 2022-09-20 DIAGNOSIS — Z9181 History of falling: Secondary | ICD-10-CM | POA: Diagnosis not present

## 2022-09-20 DIAGNOSIS — I420 Dilated cardiomyopathy: Secondary | ICD-10-CM | POA: Diagnosis not present

## 2022-09-20 DIAGNOSIS — I132 Hypertensive heart and chronic kidney disease with heart failure and with stage 5 chronic kidney disease, or end stage renal disease: Secondary | ICD-10-CM | POA: Diagnosis not present

## 2022-09-20 DIAGNOSIS — E785 Hyperlipidemia, unspecified: Secondary | ICD-10-CM | POA: Diagnosis not present

## 2022-09-20 DIAGNOSIS — I2510 Atherosclerotic heart disease of native coronary artery without angina pectoris: Secondary | ICD-10-CM | POA: Diagnosis not present

## 2022-09-20 DIAGNOSIS — J449 Chronic obstructive pulmonary disease, unspecified: Secondary | ICD-10-CM | POA: Diagnosis not present

## 2022-09-20 DIAGNOSIS — Z556 Problems related to health literacy: Secondary | ICD-10-CM | POA: Diagnosis not present

## 2022-09-20 DIAGNOSIS — I480 Paroxysmal atrial fibrillation: Secondary | ICD-10-CM | POA: Diagnosis not present

## 2022-09-20 DIAGNOSIS — I714 Abdominal aortic aneurysm, without rupture, unspecified: Secondary | ICD-10-CM | POA: Diagnosis not present

## 2022-09-20 DIAGNOSIS — G8929 Other chronic pain: Secondary | ICD-10-CM | POA: Diagnosis not present

## 2022-09-20 DIAGNOSIS — J439 Emphysema, unspecified: Secondary | ICD-10-CM | POA: Diagnosis not present

## 2022-09-20 DIAGNOSIS — M109 Gout, unspecified: Secondary | ICD-10-CM | POA: Diagnosis not present

## 2022-09-20 DIAGNOSIS — E039 Hypothyroidism, unspecified: Secondary | ICD-10-CM | POA: Diagnosis not present

## 2022-09-20 MED ORDER — APIXABAN 5 MG PO TABS: 5.0000 mg | ORAL_TABLET | Freq: Two times a day (BID) | ORAL | 5 refills | Status: DC

## 2022-09-20 NOTE — Telephone Encounter (Signed)
Pt last saw Charlsie Quest, NP on 07/27/22, last labs 09/16/22 Creat 3.12, age 77, weight 100.8kg, based on specified criteria pt is on appropriate dosage of Eliquis  BID for afib.  Will refill rx.

## 2022-09-21 DIAGNOSIS — Z992 Dependence on renal dialysis: Secondary | ICD-10-CM | POA: Diagnosis not present

## 2022-09-21 DIAGNOSIS — N186 End stage renal disease: Secondary | ICD-10-CM | POA: Diagnosis not present

## 2022-09-23 ENCOUNTER — Telehealth: Payer: Self-pay | Admitting: Internal Medicine

## 2022-09-23 DIAGNOSIS — Z992 Dependence on renal dialysis: Secondary | ICD-10-CM | POA: Diagnosis not present

## 2022-09-23 DIAGNOSIS — N186 End stage renal disease: Secondary | ICD-10-CM | POA: Diagnosis not present

## 2022-09-23 NOTE — Telephone Encounter (Signed)
Home Health Verbal Orders - Caller/Agency: Arline Aspndy with [facility] Callback Number: [phone] Requesting PT Frequency: 2 x wk for 3 wks, and 1 x wk for 6 wks  Please assist further

## 2022-09-23 NOTE — Telephone Encounter (Signed)
Called Cindy left VM giving verbal orders. Name was stated on VM.  KP 

## 2022-09-24 ENCOUNTER — Ambulatory Visit (INDEPENDENT_AMBULATORY_CARE_PROVIDER_SITE_OTHER): Payer: Medicare Other | Admitting: Internal Medicine

## 2022-09-24 ENCOUNTER — Encounter: Payer: Self-pay | Admitting: Internal Medicine

## 2022-09-24 VITALS — BP 104/60 | HR 95 | Ht 69.0 in | Wt 224.0 lb

## 2022-09-24 DIAGNOSIS — I252 Old myocardial infarction: Secondary | ICD-10-CM | POA: Diagnosis not present

## 2022-09-24 DIAGNOSIS — I420 Dilated cardiomyopathy: Secondary | ICD-10-CM | POA: Diagnosis not present

## 2022-09-24 DIAGNOSIS — I480 Paroxysmal atrial fibrillation: Secondary | ICD-10-CM | POA: Diagnosis not present

## 2022-09-24 DIAGNOSIS — M1990 Unspecified osteoarthritis, unspecified site: Secondary | ICD-10-CM | POA: Diagnosis not present

## 2022-09-24 DIAGNOSIS — M545 Low back pain, unspecified: Secondary | ICD-10-CM | POA: Diagnosis not present

## 2022-09-24 DIAGNOSIS — J449 Chronic obstructive pulmonary disease, unspecified: Secondary | ICD-10-CM | POA: Diagnosis not present

## 2022-09-24 DIAGNOSIS — M48061 Spinal stenosis, lumbar region without neurogenic claudication: Secondary | ICD-10-CM | POA: Diagnosis not present

## 2022-09-24 DIAGNOSIS — I255 Ischemic cardiomyopathy: Secondary | ICD-10-CM | POA: Diagnosis not present

## 2022-09-24 DIAGNOSIS — R5381 Other malaise: Secondary | ICD-10-CM | POA: Diagnosis not present

## 2022-09-24 DIAGNOSIS — D631 Anemia in chronic kidney disease: Secondary | ICD-10-CM | POA: Diagnosis not present

## 2022-09-24 DIAGNOSIS — I714 Abdominal aortic aneurysm, without rupture, unspecified: Secondary | ICD-10-CM | POA: Diagnosis not present

## 2022-09-24 DIAGNOSIS — E1122 Type 2 diabetes mellitus with diabetic chronic kidney disease: Secondary | ICD-10-CM | POA: Diagnosis not present

## 2022-09-24 DIAGNOSIS — E785 Hyperlipidemia, unspecified: Secondary | ICD-10-CM | POA: Diagnosis not present

## 2022-09-24 DIAGNOSIS — I132 Hypertensive heart and chronic kidney disease with heart failure and with stage 5 chronic kidney disease, or end stage renal disease: Secondary | ICD-10-CM | POA: Diagnosis not present

## 2022-09-24 DIAGNOSIS — I10 Essential (primary) hypertension: Secondary | ICD-10-CM

## 2022-09-24 DIAGNOSIS — Z992 Dependence on renal dialysis: Secondary | ICD-10-CM | POA: Diagnosis not present

## 2022-09-24 DIAGNOSIS — M5136 Other intervertebral disc degeneration, lumbar region: Secondary | ICD-10-CM | POA: Diagnosis not present

## 2022-09-24 DIAGNOSIS — I5043 Acute on chronic combined systolic (congestive) and diastolic (congestive) heart failure: Secondary | ICD-10-CM | POA: Diagnosis not present

## 2022-09-24 DIAGNOSIS — M109 Gout, unspecified: Secondary | ICD-10-CM | POA: Diagnosis not present

## 2022-09-24 DIAGNOSIS — I2510 Atherosclerotic heart disease of native coronary artery without angina pectoris: Secondary | ICD-10-CM | POA: Diagnosis not present

## 2022-09-24 DIAGNOSIS — E039 Hypothyroidism, unspecified: Secondary | ICD-10-CM | POA: Diagnosis not present

## 2022-09-24 DIAGNOSIS — J439 Emphysema, unspecified: Secondary | ICD-10-CM | POA: Diagnosis not present

## 2022-09-24 DIAGNOSIS — G8929 Other chronic pain: Secondary | ICD-10-CM | POA: Diagnosis not present

## 2022-09-24 DIAGNOSIS — N186 End stage renal disease: Secondary | ICD-10-CM | POA: Diagnosis not present

## 2022-09-24 DIAGNOSIS — Z9181 History of falling: Secondary | ICD-10-CM | POA: Diagnosis not present

## 2022-09-24 DIAGNOSIS — Z556 Problems related to health literacy: Secondary | ICD-10-CM | POA: Diagnosis not present

## 2022-09-24 DIAGNOSIS — Z7901 Long term (current) use of anticoagulants: Secondary | ICD-10-CM | POA: Diagnosis not present

## 2022-09-24 DIAGNOSIS — K219 Gastro-esophageal reflux disease without esophagitis: Secondary | ICD-10-CM | POA: Diagnosis not present

## 2022-09-24 NOTE — Assessment & Plan Note (Addendum)
Unable to take NSAIDS Recommend Tylenol 650 mg bid and 1300 mg at bedtime Recommend a Rolator or similar for safer ambulation Might also benefit from a hospital bed since he is sleeping in a recliner

## 2022-09-24 NOTE — Assessment & Plan Note (Addendum)
Clinically stable exam with well controlled BP on metoprolol and lasix.  Metoprolol dose decreased  from 150 to 50  bid He is anuric so should stop the lasix. Tolerating medications without side effects. Pt to continue current regimen and low sodium diet.

## 2022-09-24 NOTE — Progress Notes (Signed)
Date:  09/24/2022   Name:  Ronald [name] [facility]   DOB:  02/05/1946   MRN:  [id number]   Chief Complaint: Hospitalization Follow-up Hospital follow up.  Admitted to [facility] 09/13/22 to 09/16/22.  TOC call done on 09/17/22.  HPI Discharge Diagnoses: Principal Problem:   Acute on chronic combined systolic (congestive) and diastolic (congestive) heart failure Active Problems:   Essential hypertension   ESRD on dialysis   Anemia of renal disease   Paroxysmal atrial fibrillation   Coronary artery disease   Hypothyroidism   Acid reflux   Bilateral leg edema   Physical deconditioning  Assessment and Plan:   Acute on chronic combined systolic (congestive) and diastolic (congestive) heart failure Patient presented with with increasing lower extremity edema and shortness of breath.   Last 2D echocardiogram from 12/23 shows an LVEF of 35 to 40% with moderately decreased LV function and LV global hypokinesis as well as grade 2 diastolic dysfunction. Patient received renal replacement therapy for management of his volume overload and his symptoms have improved He will be discharged home to continue his dialysis as an outpatient   Essential hypertension Metoprolol dose was decreased.  Continue Imdur.  Blood pressure seems to be stable at this time.   Coronary artery disease Continue Imdur, statin and metoprolol.  No acute issues at this time  Paroxysmal atrial fibrillation Initial EKG showed sinus rhythm.  Continue amiodarone/ metoprolol and eliquis.   Hypothyroidism Continue Synthroid.   GERD.  Continue PPI  Lab Results  Component Value Date   NA 137 09/16/2022   K 3.2 (L) 09/16/2022   CO2 29 09/16/2022   GLUCOSE 104 (H) 09/16/2022   BUN 32 (H) 09/16/2022   CREATININE 3.12 (H) 09/16/2022   CALCIUM 8.7 (L) 09/16/2022   EGFR 14 (L) 05/19/2022   GFRNONAA 20 (L) 09/16/2022   Lab Results  Component Value Date   CHOL 109 07/16/2022   HDL 37 (L) 07/16/2022   LDLCALC 59 07/16/2022    TRIG 63 07/16/2022   CHOLHDL 2.9 07/16/2022   Lab Results  Component Value Date   TSH 1.951 05/28/2022   Lab Results  Component Value Date   HGBA1C 5.8 09/06/2022   Lab Results  Component Value Date   WBC 5.4 09/16/2022   HGB 8.7 (L) 09/16/2022   HCT 28.3 (L) 09/16/2022   MCV 84.5 09/16/2022   PLT 107 (L) 09/16/2022   Lab Results  Component Value Date   ALT 43 09/14/2022   AST 51 (H) 09/14/2022   ALKPHOS 116 09/14/2022   BILITOT 1.6 (H) 09/14/2022   Lab Results  Component Value Date   VD25OH 83 09/06/2022     Review of Systems  Constitutional:  Positive for fatigue. Negative for chills, fever and unexpected weight change.  HENT:  Negative for trouble swallowing.   Respiratory:  Negative for chest tightness and shortness of breath.   Cardiovascular:  Positive for leg swelling. Negative for chest pain.  Gastrointestinal:  Negative for constipation and diarrhea. Abdominal pain: seems to be related to the PD tubing. Musculoskeletal:  Positive for back pain and gait problem.  Neurological:  Positive for weakness.  Psychiatric/Behavioral:  Negative for dysphoric mood and sleep disturbance. The patient is not nervous/anxious.     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 (HCC) 07/22/2022   Coronary artery disease of native artery of native heart with stable angina pectoris (HCC) 07/22/2022   OSA (obstructive sleep apnea) 07/22/2022   Chronic systolic CHF (congestive heart failure) (HCC) 07/15/2022   Typical atrial flutter (HCC) 07/15/2022   Acute on chronic combined systolic (congestive) and diastolic (congestive) heart failure (HCC) 05/28/2022   Hypothyroidism 05/28/2022   GERD without esophagitis 05/28/2022   Essential hypertension 05/05/2022   Prediabetes 04/06/2022   Spinal stenosis of  lumbar region without neurogenic claudication 04/06/2022   Weakness of both lower extremities 04/06/2022   Morbid obesity (HCC) 04/06/2022   Upper airway cough syndrome 10/01/2021   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   Bursitis of hip 12/18/2018   Paroxysmal atrial fibrillation (HCC) 06/10/2018   Unspecified atrial fibrillation (HCC) 06/10/2018   Atherosclerosis of native arteries of extremity with intermittent claudication (HCC) 06/25/2016   Atherosclerosis of native arteries of extremities with intermittent claudication, unspecified extremity (HCC) 06/25/2016   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   Coronary artery disease involving native coronary artery with angina pectoris (HCC)    Allergic rhinitis 08/19/2015   Myocardial infarction (HCC) 12/13/2014   Anemia of renal disease 10/04/2014   AA (aortic aneurysm) (HCC) 10/04/2014   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   Basal cell papilloma 10/04/2014   Unspecified osteoarthritis, unspecified site 10/04/2014   Thoracic aortic aneurysm (TAA) (HCC) 10/04/2014   Atherosclerotic heart disease of native coronary artery without angina pectoris 10/04/2014   Benign prostatic hyperplasia with lower urinary tract symptoms 10/24/2012   DOE (dyspnea on exertion) 11/12/2009   Renal artery stenosis     Mixed hyperlipidemia 09/04/2008   History of redo bypass grafting 09/04/2008    Allergies  Allergen Reactions   Predicort [Prednisolone] Other (See Comments)    Stomach pain   Ciprofloxacin Other (See Comments)    GI upset   Hydrochlorothiazide Other (See Comments)    Dehydration   Hydrocodone Nausea Only and Other (See Comments)    Stomach upset    Hydrocodone-Acetaminophen Nausea Only    Stomach upset   Sulfa Antibiotics Other (See Comments)    Cannot recall   Penicillins Hives, Rash and Other (See Comments)    Has patient had a PCN reaction causing immediate rash, facial/tongue/throat swelling, SOB or  lightheadedness with hypotension: YES  Has patient had a PCN reaction causing severe rash involving mucus membranes or skin necrosis: NO  Has patient had a PCN reaction that required hospitalization NO  Has patient had a PCN reaction occurring within the last 10 years:NO  If all of the above answers are "NO", then may proceed with Cephalosporin use.  Has patient had a PCN reaction causing immediate rash, facial/tongue/throat swelling, SOB or lightheadedness with hypotension: YES Has patient had a PCN reaction causing severe rash involving mucus membranes or skin necrosis: NO Has patient had a PCN reaction that required hospitalization NO Has patient had a PCN reaction occurring within the last 10 years:NO If all of the above answers are "NO", then may proceed with Cephalosporin use.    Past Surgical History:  Procedure Laterality Date   CARDIAC CATHETERIZATION  "several"   CARDIAC CATHETERIZATION N/A 12/13/2014   Procedure: Left Heart Cath and Coronary Angiography;  Surgeon: Corky Crafts, MD;  Location: [facility] INVASIVE CV LAB;  Service: Cardiovascular;  Laterality: N/A;   CARDIAC CATHETERIZATION  12/13/2014   Procedure: Coronary Stent Intervention;  Surgeon: Corky Crafts, MD;  Location: [facility] INVASIVE CV LAB;  Service: Cardiovascular;;  CARDIAC CATHETERIZATION N/A 10/07/2015   Procedure: Left Heart Cath and Cors/Grafts Angiography;  Surgeon: Kathleene Hazel, MD;  Location: [facility] INVASIVE CV LAB;  Service: Cardiovascular;  Laterality: N/A;   CARDIAC CATHETERIZATION N/A 10/07/2015   Procedure: Coronary Stent Intervention;  Surgeon: Kathleene Hazel, MD;  Location: [facility] INVASIVE CV LAB;  Service: Cardiovascular;  Laterality: N/A;   CARDIOVERSION N/A 05/25/2022   Procedure: CARDIOVERSION;  Surgeon: Thomasene Ripple, DO;  Location: [facility] ENDOSCOPY;  Service: Cardiovascular;  Laterality: N/A;   CATARACT EXTRACTION W/PHACO Left 10/21/2021   Procedure: CATARACT EXTRACTION PHACO AND INTRAOCULAR  LENS PLACEMENT (IOC) LEFT 3.94 00:33.7;  Surgeon: Nevada Crane, MD;  Location: [facility];  Service: Ophthalmology;  Laterality: Left;   CATARACT EXTRACTION W/PHACO Right 11/09/2021   Procedure: CATARACT EXTRACTION PHACO AND INTRAOCULAR LENS PLACEMENT (IOC) RIGHT;  Surgeon: Nevada Crane, MD;  Location: [facility];  Service: Ophthalmology;  Laterality: Right;  3.59 0:29.4   CORONARY ANGIOPLASTY  "several"   CORONARY ANGIOPLASTY WITH STENT PLACEMENT  2005; 12/13/2014   "2; 1"   CORONARY ARTERY BYPASS GRAFT  1996   CABG X5   CORONARY ARTERY BYPASS GRAFT  March 2007   CABG X3   DIALYSIS/PERMA CATHETER INSERTION N/A 09/10/2022   Procedure: DIALYSIS/PERMA CATHETER INSERTION;  Surgeon: Renford Dills, MD;  Location: [facility] INVASIVE CV LAB;  Service: Cardiovascular;  Laterality: N/A;   ESOPHAGOGASTRODUODENOSCOPY (EGD) WITH ESOPHAGEAL DILATION  2000   GREEN LIGHT LASER TURP (TRANSURETHRAL RESECTION OF PROSTATE  2000's   "not cancerous"   HERNIA REPAIR     LAPAROSCOPIC CHOLECYSTECTOMY     LEFT HEART CATH AND CORS/GRAFTS ANGIOGRAPHY N/A 07/28/2017   Procedure: LEFT HEART CATH AND CORS/GRAFTS ANGIOGRAPHY;  Surgeon: Lennette Bihari, MD;  Location: [facility] INVASIVE CV LAB;  Service: Cardiovascular;  Laterality: N/A;   LUNG SURGERY  1996   "S/P CABG, had to put staple in lung after it had collapsed"   UMBILICAL HERNIA REPAIR     w/chole    Social History   Tobacco Use   Smoking status: Former    Packs/day: 3.00    Years: 20.00    Additional pack years: 0.00    Total pack years: 60.00    Types: Cigarettes    Quit date: 07/25/1986    Years since quitting: 36.1   Smokeless tobacco: Never  Vaping Use   Vaping Use: Never used  Substance Use Topics   Alcohol use: No    Alcohol/week: 0.0 standard drinks of alcohol   Drug use: No     Medication list has been reviewed and updated.  Current Meds  Medication Sig   acetaminophen (TYLENOL) 650 MG CR tablet Take 1,300 mg  by mouth at bedtime.   allopurinol (ZYLOPRIM) 100 MG tablet TAKE 1 TABLET BY MOUTH EVERYDAY AT BEDTIME   amiodarone (PACERONE) 200 MG tablet Take 200 mg by mouth daily.   apixaban (ELIQUIS) 5 MG TABS tablet Take 1 tablet (5 mg total) by mouth 2 (two) times daily.   atorvastatin (LIPITOR) 40 MG tablet Take 1 tablet (40 mg total) by mouth at bedtime.   Cholecalciferol (VITAMIN D-3) 125 MCG (5000 UT) TABS Take 2,000 Units by mouth daily.    isosorbide mononitrate (IMDUR) 60 MG 24 hr tablet Take 1 tablet (60 mg total) by mouth daily. Increased from 30 mg. (Patient taking differently: Take 60 mg by mouth daily.)   levothyroxine (SYNTHROID) 75 MCG tablet Take 1 tablet (75 mcg total) by mouth daily before  breakfast.   metoprolol tartrate (LOPRESSOR) 50 MG tablet Take 1 tablet (50 mg total) by mouth 2 (two) times daily.   Multiple Vitamin (MULTIVITAMIN PO) Take 1 tablet by mouth daily.   nitroGLYCERIN (NITROSTAT) 0.4 MG SL tablet Place 0.4 mg under the tongue every 5 (five) minutes as needed for chest pain (Up to 3 times).   pantoprazole (PROTONIX) 40 MG tablet Take 1 tablet (40 mg total) by mouth 2 (two) times daily. Home med.   polyethylene glycol (MIRALAX / GLYCOLAX) 17 g packet Take 17 g by mouth at bedtime.   senna (SENOKOT) 8.6 MG tablet Take 2 tablets by mouth 2 (two) times daily.   sodium bicarbonate 650 MG tablet Take 650 mg by mouth 2 (two) times daily.   [DISCONTINUED] furosemide (LASIX) 40 MG tablet Take 1.5 tablets (60 mg total) by mouth daily. Increased from 40 mg.       09/24/2022    2:04 PM 07/26/2022    1:48 PM 04/06/2022    3:38 PM  GAD 7 : Generalized Anxiety Score  Nervous, Anxious, on Edge 1 3 2   Control/stop worrying 1 3 2   Worry too much - different things 1 3 3   Trouble relaxing 3 3 2   Restless 2 0 2  Easily annoyed or irritable 0 0 2  Afraid - awful might happen 0 2 2  Total GAD 7 Score 8 14 15   Anxiety Difficulty Somewhat difficult Somewhat difficult Extremely difficult        09/24/2022    2:03 PM 07/26/2022    1:47 PM 06/25/2022    1:37 PM  Depression screen PHQ 2/9  Decreased Interest 0 2 0  Down, Depressed, Hopeless 0 2 1  PHQ - 2 Score 0 4 1  Altered sleeping 3 2   Tired, decreased energy 3 0   Change in appetite 1 2   Feeling bad or failure about yourself  1 2   Trouble concentrating 0 0   Moving slowly or fidgety/restless 3 3   Suicidal thoughts 0 0   PHQ-9 Score 11 13   Difficult doing work/chores Not difficult at all Somewhat difficult     BP Readings from Last 3 Encounters:  09/24/22 104/60  09/16/22 133/72  09/10/22 120/72    Physical Exam Vitals and nursing note reviewed.  Constitutional:      General: He is not in acute distress.    Appearance: He is well-developed. He is obese.  HENT:     Head: Normocephalic and atraumatic.  Cardiovascular:     Rate and Rhythm: Normal rate and regular rhythm.  Pulmonary:     Effort: Pulmonary effort is normal. No respiratory distress.     Breath sounds: No wheezing or rhonchi.  Chest:       Comments: Permacath in right upper chest Abdominal:     General: There is distension.     Palpations: Abdomen is soft.     Tenderness: There is abdominal tenderness (diffusely).       Comments: PD catheter right mid abdomen  Musculoskeletal:     Cervical back: Normal range of motion.     Right lower leg: Edema present.     Left lower leg: Edema present.  Lymphadenopathy:     Cervical: No cervical adenopathy.  Skin:    General: Skin is warm and dry.     Findings: No rash.  Neurological:     General: No focal deficit present.     Mental Status: He is  alert and oriented to person, place, and time.     Gait: Gait abnormal.  Psychiatric:        Mood and Affect: Mood normal.        Behavior: Behavior normal.    Diabetic Foot Exam - Simple   Simple Foot Form Diabetic Foot exam was performed with the following findings: Yes 09/24/2022  2:21 PM  Visual Inspection No deformities, no  ulcerations, no other skin breakdown bilaterally: Yes Sensation Testing Intact to touch and monofilament testing bilaterally: Yes Pulse Check Posterior Tibialis and Dorsalis pulse intact bilaterally: Yes Comments Swelling diffusely      Wt Readings from Last 3 Encounters:  09/24/22 224 lb (101.6 kg)  09/16/22 222 lb 3.6 oz (100.8 kg)  09/10/22 232 lb (105.2 kg)    BP 104/60   Pulse 95   Ht 5\' 9"  (1.753 m)   Wt 224 lb (101.6 kg)   SpO2 95%   BMI 33.08 kg/m   Assessment and Plan:  Problem List Items Addressed This Visit       Cardiovascular and Mediastinum   Essential hypertension - Primary (Chronic)    Clinically stable exam with well controlled BP on metoprolol and lasix.  Metoprolol dose decreased  from 150 to 50  bid He is anuric so should stop the lasix. Tolerating medications without side effects. Pt to continue current regimen and low sodium diet.         Genitourinary   ESRD on dialysis [facility])    Tolerating HD currently Hoping to transition to PD in the near future        Other   Physical deconditioning    Continue home PT ordered at discharge Also work on home exercise program       Spinal stenosis of lumbar region without neurogenic claudication (Chronic)    Unable to take NSAIDS Recommend Tylenol 650 mg bid and 1300 mg at bedtime Recommend a Rolator or similar for safer ambulation Might also benefit from a hospital bed since he is sleeping in a recliner       No follow-ups on file.   Partially dictated using Dragon software, any errors are not intentional.  Reubin Milan, MD [facility] [place], [place]

## 2022-09-24 NOTE — Assessment & Plan Note (Signed)
Tolerating HD currently Hoping to transition to PD in the near future

## 2022-09-24 NOTE — Assessment & Plan Note (Addendum)
Continue home PT ordered at discharge Also work on home exercise program

## 2022-09-25 DIAGNOSIS — Z992 Dependence on renal dialysis: Secondary | ICD-10-CM | POA: Diagnosis not present

## 2022-09-25 DIAGNOSIS — N186 End stage renal disease: Secondary | ICD-10-CM | POA: Diagnosis not present

## 2022-09-27 DIAGNOSIS — G8929 Other chronic pain: Secondary | ICD-10-CM | POA: Diagnosis not present

## 2022-09-27 DIAGNOSIS — I2510 Atherosclerotic heart disease of native coronary artery without angina pectoris: Secondary | ICD-10-CM | POA: Diagnosis not present

## 2022-09-27 DIAGNOSIS — M109 Gout, unspecified: Secondary | ICD-10-CM | POA: Diagnosis not present

## 2022-09-27 DIAGNOSIS — I255 Ischemic cardiomyopathy: Secondary | ICD-10-CM | POA: Diagnosis not present

## 2022-09-27 DIAGNOSIS — M1990 Unspecified osteoarthritis, unspecified site: Secondary | ICD-10-CM | POA: Diagnosis not present

## 2022-09-27 DIAGNOSIS — Z556 Problems related to health literacy: Secondary | ICD-10-CM | POA: Diagnosis not present

## 2022-09-27 DIAGNOSIS — Z9181 History of falling: Secondary | ICD-10-CM | POA: Diagnosis not present

## 2022-09-27 DIAGNOSIS — N186 End stage renal disease: Secondary | ICD-10-CM | POA: Diagnosis not present

## 2022-09-27 DIAGNOSIS — I420 Dilated cardiomyopathy: Secondary | ICD-10-CM | POA: Diagnosis not present

## 2022-09-27 DIAGNOSIS — I480 Paroxysmal atrial fibrillation: Secondary | ICD-10-CM | POA: Diagnosis not present

## 2022-09-27 DIAGNOSIS — I5043 Acute on chronic combined systolic (congestive) and diastolic (congestive) heart failure: Secondary | ICD-10-CM | POA: Diagnosis not present

## 2022-09-27 DIAGNOSIS — Z7901 Long term (current) use of anticoagulants: Secondary | ICD-10-CM | POA: Diagnosis not present

## 2022-09-27 DIAGNOSIS — E785 Hyperlipidemia, unspecified: Secondary | ICD-10-CM | POA: Diagnosis not present

## 2022-09-27 DIAGNOSIS — K219 Gastro-esophageal reflux disease without esophagitis: Secondary | ICD-10-CM | POA: Diagnosis not present

## 2022-09-27 DIAGNOSIS — M5136 Other intervertebral disc degeneration, lumbar region: Secondary | ICD-10-CM | POA: Diagnosis not present

## 2022-09-27 DIAGNOSIS — Z992 Dependence on renal dialysis: Secondary | ICD-10-CM | POA: Diagnosis not present

## 2022-09-27 DIAGNOSIS — M545 Low back pain, unspecified: Secondary | ICD-10-CM | POA: Diagnosis not present

## 2022-09-27 DIAGNOSIS — J449 Chronic obstructive pulmonary disease, unspecified: Secondary | ICD-10-CM | POA: Diagnosis not present

## 2022-09-27 DIAGNOSIS — D631 Anemia in chronic kidney disease: Secondary | ICD-10-CM | POA: Diagnosis not present

## 2022-09-27 DIAGNOSIS — I252 Old myocardial infarction: Secondary | ICD-10-CM | POA: Diagnosis not present

## 2022-09-27 DIAGNOSIS — I714 Abdominal aortic aneurysm, without rupture, unspecified: Secondary | ICD-10-CM | POA: Diagnosis not present

## 2022-09-27 DIAGNOSIS — E039 Hypothyroidism, unspecified: Secondary | ICD-10-CM | POA: Diagnosis not present

## 2022-09-27 DIAGNOSIS — E1122 Type 2 diabetes mellitus with diabetic chronic kidney disease: Secondary | ICD-10-CM | POA: Diagnosis not present

## 2022-09-27 DIAGNOSIS — J439 Emphysema, unspecified: Secondary | ICD-10-CM | POA: Diagnosis not present

## 2022-09-27 DIAGNOSIS — I132 Hypertensive heart and chronic kidney disease with heart failure and with stage 5 chronic kidney disease, or end stage renal disease: Secondary | ICD-10-CM | POA: Diagnosis not present

## 2022-09-28 DIAGNOSIS — N186 End stage renal disease: Secondary | ICD-10-CM | POA: Diagnosis not present

## 2022-09-28 DIAGNOSIS — Z992 Dependence on renal dialysis: Secondary | ICD-10-CM | POA: Diagnosis not present

## 2022-09-29 DIAGNOSIS — G8929 Other chronic pain: Secondary | ICD-10-CM | POA: Diagnosis not present

## 2022-09-29 DIAGNOSIS — E785 Hyperlipidemia, unspecified: Secondary | ICD-10-CM | POA: Diagnosis not present

## 2022-09-29 DIAGNOSIS — I714 Abdominal aortic aneurysm, without rupture, unspecified: Secondary | ICD-10-CM | POA: Diagnosis not present

## 2022-09-29 DIAGNOSIS — J439 Emphysema, unspecified: Secondary | ICD-10-CM | POA: Diagnosis not present

## 2022-09-29 DIAGNOSIS — D631 Anemia in chronic kidney disease: Secondary | ICD-10-CM | POA: Diagnosis not present

## 2022-09-29 DIAGNOSIS — M5136 Other intervertebral disc degeneration, lumbar region: Secondary | ICD-10-CM | POA: Diagnosis not present

## 2022-09-29 DIAGNOSIS — I420 Dilated cardiomyopathy: Secondary | ICD-10-CM | POA: Diagnosis not present

## 2022-09-29 DIAGNOSIS — Z556 Problems related to health literacy: Secondary | ICD-10-CM | POA: Diagnosis not present

## 2022-09-29 DIAGNOSIS — E039 Hypothyroidism, unspecified: Secondary | ICD-10-CM | POA: Diagnosis not present

## 2022-09-29 DIAGNOSIS — Z7901 Long term (current) use of anticoagulants: Secondary | ICD-10-CM | POA: Diagnosis not present

## 2022-09-29 DIAGNOSIS — I132 Hypertensive heart and chronic kidney disease with heart failure and with stage 5 chronic kidney disease, or end stage renal disease: Secondary | ICD-10-CM | POA: Diagnosis not present

## 2022-09-29 DIAGNOSIS — Z9181 History of falling: Secondary | ICD-10-CM | POA: Diagnosis not present

## 2022-09-29 DIAGNOSIS — Z992 Dependence on renal dialysis: Secondary | ICD-10-CM | POA: Diagnosis not present

## 2022-09-29 DIAGNOSIS — I252 Old myocardial infarction: Secondary | ICD-10-CM | POA: Diagnosis not present

## 2022-09-29 DIAGNOSIS — M545 Low back pain, unspecified: Secondary | ICD-10-CM | POA: Diagnosis not present

## 2022-09-29 DIAGNOSIS — E1122 Type 2 diabetes mellitus with diabetic chronic kidney disease: Secondary | ICD-10-CM | POA: Diagnosis not present

## 2022-09-29 DIAGNOSIS — M1990 Unspecified osteoarthritis, unspecified site: Secondary | ICD-10-CM | POA: Diagnosis not present

## 2022-09-29 DIAGNOSIS — I5043 Acute on chronic combined systolic (congestive) and diastolic (congestive) heart failure: Secondary | ICD-10-CM | POA: Diagnosis not present

## 2022-09-29 DIAGNOSIS — K219 Gastro-esophageal reflux disease without esophagitis: Secondary | ICD-10-CM | POA: Diagnosis not present

## 2022-09-29 DIAGNOSIS — J449 Chronic obstructive pulmonary disease, unspecified: Secondary | ICD-10-CM | POA: Diagnosis not present

## 2022-09-29 DIAGNOSIS — I255 Ischemic cardiomyopathy: Secondary | ICD-10-CM | POA: Diagnosis not present

## 2022-09-29 DIAGNOSIS — I480 Paroxysmal atrial fibrillation: Secondary | ICD-10-CM | POA: Diagnosis not present

## 2022-09-29 DIAGNOSIS — N186 End stage renal disease: Secondary | ICD-10-CM | POA: Diagnosis not present

## 2022-09-29 DIAGNOSIS — I2510 Atherosclerotic heart disease of native coronary artery without angina pectoris: Secondary | ICD-10-CM | POA: Diagnosis not present

## 2022-09-29 DIAGNOSIS — M109 Gout, unspecified: Secondary | ICD-10-CM | POA: Diagnosis not present

## 2022-09-30 DIAGNOSIS — N186 End stage renal disease: Secondary | ICD-10-CM | POA: Diagnosis not present

## 2022-09-30 DIAGNOSIS — Z992 Dependence on renal dialysis: Secondary | ICD-10-CM | POA: Diagnosis not present

## 2022-10-02 DIAGNOSIS — N186 End stage renal disease: Secondary | ICD-10-CM | POA: Diagnosis not present

## 2022-10-02 DIAGNOSIS — Z992 Dependence on renal dialysis: Secondary | ICD-10-CM | POA: Diagnosis not present

## 2022-10-04 DIAGNOSIS — M109 Gout, unspecified: Secondary | ICD-10-CM | POA: Diagnosis not present

## 2022-10-04 DIAGNOSIS — J439 Emphysema, unspecified: Secondary | ICD-10-CM | POA: Diagnosis not present

## 2022-10-04 DIAGNOSIS — Z556 Problems related to health literacy: Secondary | ICD-10-CM | POA: Diagnosis not present

## 2022-10-04 DIAGNOSIS — E785 Hyperlipidemia, unspecified: Secondary | ICD-10-CM | POA: Diagnosis not present

## 2022-10-04 DIAGNOSIS — I420 Dilated cardiomyopathy: Secondary | ICD-10-CM | POA: Diagnosis not present

## 2022-10-04 DIAGNOSIS — D631 Anemia in chronic kidney disease: Secondary | ICD-10-CM | POA: Diagnosis not present

## 2022-10-04 DIAGNOSIS — E1122 Type 2 diabetes mellitus with diabetic chronic kidney disease: Secondary | ICD-10-CM | POA: Diagnosis not present

## 2022-10-04 DIAGNOSIS — I5043 Acute on chronic combined systolic (congestive) and diastolic (congestive) heart failure: Secondary | ICD-10-CM | POA: Diagnosis not present

## 2022-10-04 DIAGNOSIS — I2510 Atherosclerotic heart disease of native coronary artery without angina pectoris: Secondary | ICD-10-CM | POA: Diagnosis not present

## 2022-10-04 DIAGNOSIS — I714 Abdominal aortic aneurysm, without rupture, unspecified: Secondary | ICD-10-CM | POA: Diagnosis not present

## 2022-10-04 DIAGNOSIS — J449 Chronic obstructive pulmonary disease, unspecified: Secondary | ICD-10-CM | POA: Diagnosis not present

## 2022-10-04 DIAGNOSIS — Z7901 Long term (current) use of anticoagulants: Secondary | ICD-10-CM | POA: Diagnosis not present

## 2022-10-04 DIAGNOSIS — I255 Ischemic cardiomyopathy: Secondary | ICD-10-CM | POA: Diagnosis not present

## 2022-10-04 DIAGNOSIS — I252 Old myocardial infarction: Secondary | ICD-10-CM | POA: Diagnosis not present

## 2022-10-04 DIAGNOSIS — N186 End stage renal disease: Secondary | ICD-10-CM | POA: Diagnosis not present

## 2022-10-04 DIAGNOSIS — Z9181 History of falling: Secondary | ICD-10-CM | POA: Diagnosis not present

## 2022-10-04 DIAGNOSIS — M5136 Other intervertebral disc degeneration, lumbar region: Secondary | ICD-10-CM | POA: Diagnosis not present

## 2022-10-04 DIAGNOSIS — Z992 Dependence on renal dialysis: Secondary | ICD-10-CM | POA: Diagnosis not present

## 2022-10-04 DIAGNOSIS — K219 Gastro-esophageal reflux disease without esophagitis: Secondary | ICD-10-CM | POA: Diagnosis not present

## 2022-10-04 DIAGNOSIS — I480 Paroxysmal atrial fibrillation: Secondary | ICD-10-CM | POA: Diagnosis not present

## 2022-10-04 DIAGNOSIS — M545 Low back pain, unspecified: Secondary | ICD-10-CM | POA: Diagnosis not present

## 2022-10-04 DIAGNOSIS — G8929 Other chronic pain: Secondary | ICD-10-CM | POA: Diagnosis not present

## 2022-10-04 DIAGNOSIS — E039 Hypothyroidism, unspecified: Secondary | ICD-10-CM | POA: Diagnosis not present

## 2022-10-04 DIAGNOSIS — M1990 Unspecified osteoarthritis, unspecified site: Secondary | ICD-10-CM | POA: Diagnosis not present

## 2022-10-04 DIAGNOSIS — I132 Hypertensive heart and chronic kidney disease with heart failure and with stage 5 chronic kidney disease, or end stage renal disease: Secondary | ICD-10-CM | POA: Diagnosis not present

## 2022-10-06 DIAGNOSIS — Z992 Dependence on renal dialysis: Secondary | ICD-10-CM | POA: Diagnosis not present

## 2022-10-06 DIAGNOSIS — Z951 Presence of aortocoronary bypass graft: Secondary | ICD-10-CM | POA: Diagnosis not present

## 2022-10-06 DIAGNOSIS — G4733 Obstructive sleep apnea (adult) (pediatric): Secondary | ICD-10-CM | POA: Diagnosis not present

## 2022-10-06 DIAGNOSIS — N186 End stage renal disease: Secondary | ICD-10-CM | POA: Diagnosis not present

## 2022-10-06 DIAGNOSIS — I120 Hypertensive chronic kidney disease with stage 5 chronic kidney disease or end stage renal disease: Secondary | ICD-10-CM | POA: Diagnosis not present

## 2022-10-06 DIAGNOSIS — I25118 Atherosclerotic heart disease of native coronary artery with other forms of angina pectoris: Secondary | ICD-10-CM | POA: Diagnosis not present

## 2022-10-06 DIAGNOSIS — I10 Essential (primary) hypertension: Secondary | ICD-10-CM | POA: Diagnosis not present

## 2022-10-06 DIAGNOSIS — Z7901 Long term (current) use of anticoagulants: Secondary | ICD-10-CM | POA: Diagnosis not present

## 2022-10-08 DIAGNOSIS — I5043 Acute on chronic combined systolic (congestive) and diastolic (congestive) heart failure: Secondary | ICD-10-CM | POA: Diagnosis not present

## 2022-10-08 DIAGNOSIS — I255 Ischemic cardiomyopathy: Secondary | ICD-10-CM | POA: Diagnosis not present

## 2022-10-08 DIAGNOSIS — I2510 Atherosclerotic heart disease of native coronary artery without angina pectoris: Secondary | ICD-10-CM | POA: Diagnosis not present

## 2022-10-08 DIAGNOSIS — Z556 Problems related to health literacy: Secondary | ICD-10-CM | POA: Diagnosis not present

## 2022-10-08 DIAGNOSIS — I132 Hypertensive heart and chronic kidney disease with heart failure and with stage 5 chronic kidney disease, or end stage renal disease: Secondary | ICD-10-CM | POA: Diagnosis not present

## 2022-10-08 DIAGNOSIS — I252 Old myocardial infarction: Secondary | ICD-10-CM | POA: Diagnosis not present

## 2022-10-08 DIAGNOSIS — G8929 Other chronic pain: Secondary | ICD-10-CM | POA: Diagnosis not present

## 2022-10-08 DIAGNOSIS — M109 Gout, unspecified: Secondary | ICD-10-CM | POA: Diagnosis not present

## 2022-10-08 DIAGNOSIS — M1990 Unspecified osteoarthritis, unspecified site: Secondary | ICD-10-CM | POA: Diagnosis not present

## 2022-10-08 DIAGNOSIS — N186 End stage renal disease: Secondary | ICD-10-CM | POA: Diagnosis not present

## 2022-10-08 DIAGNOSIS — J449 Chronic obstructive pulmonary disease, unspecified: Secondary | ICD-10-CM | POA: Diagnosis not present

## 2022-10-08 DIAGNOSIS — Z992 Dependence on renal dialysis: Secondary | ICD-10-CM | POA: Diagnosis not present

## 2022-10-08 DIAGNOSIS — Z7901 Long term (current) use of anticoagulants: Secondary | ICD-10-CM | POA: Diagnosis not present

## 2022-10-08 DIAGNOSIS — M5136 Other intervertebral disc degeneration, lumbar region: Secondary | ICD-10-CM | POA: Diagnosis not present

## 2022-10-08 DIAGNOSIS — Z9181 History of falling: Secondary | ICD-10-CM | POA: Diagnosis not present

## 2022-10-08 DIAGNOSIS — K219 Gastro-esophageal reflux disease without esophagitis: Secondary | ICD-10-CM | POA: Diagnosis not present

## 2022-10-08 DIAGNOSIS — I480 Paroxysmal atrial fibrillation: Secondary | ICD-10-CM | POA: Diagnosis not present

## 2022-10-08 DIAGNOSIS — E039 Hypothyroidism, unspecified: Secondary | ICD-10-CM | POA: Diagnosis not present

## 2022-10-08 DIAGNOSIS — I714 Abdominal aortic aneurysm, without rupture, unspecified: Secondary | ICD-10-CM | POA: Diagnosis not present

## 2022-10-08 DIAGNOSIS — I420 Dilated cardiomyopathy: Secondary | ICD-10-CM | POA: Diagnosis not present

## 2022-10-08 DIAGNOSIS — J439 Emphysema, unspecified: Secondary | ICD-10-CM | POA: Diagnosis not present

## 2022-10-08 DIAGNOSIS — E785 Hyperlipidemia, unspecified: Secondary | ICD-10-CM | POA: Diagnosis not present

## 2022-10-08 DIAGNOSIS — M545 Low back pain, unspecified: Secondary | ICD-10-CM | POA: Diagnosis not present

## 2022-10-08 DIAGNOSIS — D631 Anemia in chronic kidney disease: Secondary | ICD-10-CM | POA: Diagnosis not present

## 2022-10-08 DIAGNOSIS — E1122 Type 2 diabetes mellitus with diabetic chronic kidney disease: Secondary | ICD-10-CM | POA: Diagnosis not present

## 2022-10-11 DIAGNOSIS — I480 Paroxysmal atrial fibrillation: Secondary | ICD-10-CM | POA: Diagnosis not present

## 2022-10-11 DIAGNOSIS — E1122 Type 2 diabetes mellitus with diabetic chronic kidney disease: Secondary | ICD-10-CM | POA: Diagnosis not present

## 2022-10-11 DIAGNOSIS — M109 Gout, unspecified: Secondary | ICD-10-CM | POA: Diagnosis not present

## 2022-10-11 DIAGNOSIS — I5043 Acute on chronic combined systolic (congestive) and diastolic (congestive) heart failure: Secondary | ICD-10-CM | POA: Diagnosis not present

## 2022-10-11 DIAGNOSIS — I252 Old myocardial infarction: Secondary | ICD-10-CM | POA: Diagnosis not present

## 2022-10-11 DIAGNOSIS — Z7901 Long term (current) use of anticoagulants: Secondary | ICD-10-CM | POA: Diagnosis not present

## 2022-10-11 DIAGNOSIS — I714 Abdominal aortic aneurysm, without rupture, unspecified: Secondary | ICD-10-CM | POA: Diagnosis not present

## 2022-10-11 DIAGNOSIS — Z992 Dependence on renal dialysis: Secondary | ICD-10-CM | POA: Diagnosis not present

## 2022-10-11 DIAGNOSIS — J439 Emphysema, unspecified: Secondary | ICD-10-CM | POA: Diagnosis not present

## 2022-10-11 DIAGNOSIS — D631 Anemia in chronic kidney disease: Secondary | ICD-10-CM | POA: Diagnosis not present

## 2022-10-11 DIAGNOSIS — M1990 Unspecified osteoarthritis, unspecified site: Secondary | ICD-10-CM | POA: Diagnosis not present

## 2022-10-11 DIAGNOSIS — M5136 Other intervertebral disc degeneration, lumbar region: Secondary | ICD-10-CM | POA: Diagnosis not present

## 2022-10-11 DIAGNOSIS — N186 End stage renal disease: Secondary | ICD-10-CM | POA: Diagnosis not present

## 2022-10-11 DIAGNOSIS — E039 Hypothyroidism, unspecified: Secondary | ICD-10-CM | POA: Diagnosis not present

## 2022-10-11 DIAGNOSIS — I2510 Atherosclerotic heart disease of native coronary artery without angina pectoris: Secondary | ICD-10-CM | POA: Diagnosis not present

## 2022-10-11 DIAGNOSIS — K219 Gastro-esophageal reflux disease without esophagitis: Secondary | ICD-10-CM | POA: Diagnosis not present

## 2022-10-11 DIAGNOSIS — E785 Hyperlipidemia, unspecified: Secondary | ICD-10-CM | POA: Diagnosis not present

## 2022-10-11 DIAGNOSIS — I255 Ischemic cardiomyopathy: Secondary | ICD-10-CM | POA: Diagnosis not present

## 2022-10-11 DIAGNOSIS — Z556 Problems related to health literacy: Secondary | ICD-10-CM | POA: Diagnosis not present

## 2022-10-11 DIAGNOSIS — J449 Chronic obstructive pulmonary disease, unspecified: Secondary | ICD-10-CM | POA: Diagnosis not present

## 2022-10-11 DIAGNOSIS — M545 Low back pain, unspecified: Secondary | ICD-10-CM | POA: Diagnosis not present

## 2022-10-11 DIAGNOSIS — I132 Hypertensive heart and chronic kidney disease with heart failure and with stage 5 chronic kidney disease, or end stage renal disease: Secondary | ICD-10-CM | POA: Diagnosis not present

## 2022-10-11 DIAGNOSIS — Z9181 History of falling: Secondary | ICD-10-CM | POA: Diagnosis not present

## 2022-10-11 DIAGNOSIS — G8929 Other chronic pain: Secondary | ICD-10-CM | POA: Diagnosis not present

## 2022-10-11 DIAGNOSIS — I420 Dilated cardiomyopathy: Secondary | ICD-10-CM | POA: Diagnosis not present

## 2022-10-13 ENCOUNTER — Encounter: Payer: Medicare Other | Admitting: Family

## 2022-10-13 DIAGNOSIS — N186 End stage renal disease: Secondary | ICD-10-CM | POA: Diagnosis not present

## 2022-10-13 DIAGNOSIS — Z992 Dependence on renal dialysis: Secondary | ICD-10-CM | POA: Diagnosis not present

## 2022-10-15 DIAGNOSIS — Z882 Allergy status to sulfonamides status: Secondary | ICD-10-CM | POA: Diagnosis not present

## 2022-10-15 DIAGNOSIS — G4733 Obstructive sleep apnea (adult) (pediatric): Secondary | ICD-10-CM | POA: Diagnosis not present

## 2022-10-15 DIAGNOSIS — E785 Hyperlipidemia, unspecified: Secondary | ICD-10-CM | POA: Diagnosis not present

## 2022-10-15 DIAGNOSIS — K219 Gastro-esophageal reflux disease without esophagitis: Secondary | ICD-10-CM | POA: Diagnosis not present

## 2022-10-15 DIAGNOSIS — I2510 Atherosclerotic heart disease of native coronary artery without angina pectoris: Secondary | ICD-10-CM | POA: Diagnosis not present

## 2022-10-15 DIAGNOSIS — I509 Heart failure, unspecified: Secondary | ICD-10-CM | POA: Diagnosis not present

## 2022-10-15 DIAGNOSIS — Z880 Allergy status to penicillin: Secondary | ICD-10-CM | POA: Diagnosis not present

## 2022-10-15 DIAGNOSIS — I480 Paroxysmal atrial fibrillation: Secondary | ICD-10-CM | POA: Diagnosis not present

## 2022-10-15 DIAGNOSIS — N186 End stage renal disease: Secondary | ICD-10-CM | POA: Diagnosis not present

## 2022-10-15 DIAGNOSIS — R7303 Prediabetes: Secondary | ICD-10-CM | POA: Diagnosis not present

## 2022-10-15 DIAGNOSIS — I120 Hypertensive chronic kidney disease with stage 5 chronic kidney disease or end stage renal disease: Secondary | ICD-10-CM | POA: Diagnosis not present

## 2022-10-15 DIAGNOSIS — Z888 Allergy status to other drugs, medicaments and biological substances status: Secondary | ICD-10-CM | POA: Diagnosis not present

## 2022-10-15 DIAGNOSIS — Z9689 Presence of other specified functional implants: Secondary | ICD-10-CM | POA: Diagnosis not present

## 2022-10-15 DIAGNOSIS — Z992 Dependence on renal dialysis: Secondary | ICD-10-CM | POA: Diagnosis not present

## 2022-10-15 DIAGNOSIS — Z881 Allergy status to other antibiotic agents status: Secondary | ICD-10-CM | POA: Diagnosis not present

## 2022-10-15 DIAGNOSIS — E039 Hypothyroidism, unspecified: Secondary | ICD-10-CM | POA: Diagnosis not present

## 2022-10-15 DIAGNOSIS — I132 Hypertensive heart and chronic kidney disease with heart failure and with stage 5 chronic kidney disease, or end stage renal disease: Secondary | ICD-10-CM | POA: Diagnosis not present

## 2022-10-15 DIAGNOSIS — I25118 Atherosclerotic heart disease of native coronary artery with other forms of angina pectoris: Secondary | ICD-10-CM | POA: Diagnosis not present

## 2022-10-15 DIAGNOSIS — Z7901 Long term (current) use of anticoagulants: Secondary | ICD-10-CM | POA: Diagnosis not present

## 2022-10-15 DIAGNOSIS — Z79899 Other long term (current) drug therapy: Secondary | ICD-10-CM | POA: Diagnosis not present

## 2022-10-15 DIAGNOSIS — Z955 Presence of coronary angioplasty implant and graft: Secondary | ICD-10-CM | POA: Diagnosis not present

## 2022-10-15 DIAGNOSIS — Z885 Allergy status to narcotic agent status: Secondary | ICD-10-CM | POA: Diagnosis not present

## 2022-10-15 DIAGNOSIS — T85691A Other mechanical complication of intraperitoneal dialysis catheter, initial encounter: Secondary | ICD-10-CM | POA: Diagnosis not present

## 2022-10-15 DIAGNOSIS — Z87891 Personal history of nicotine dependence: Secondary | ICD-10-CM | POA: Diagnosis not present

## 2022-10-15 DIAGNOSIS — Z951 Presence of aortocoronary bypass graft: Secondary | ICD-10-CM | POA: Diagnosis not present

## 2022-10-17 ENCOUNTER — Encounter: Payer: Self-pay | Admitting: Internal Medicine

## 2022-10-17 ENCOUNTER — Encounter: Payer: Self-pay | Admitting: Cardiovascular Disease

## 2022-10-18 ENCOUNTER — Other Ambulatory Visit: Payer: Self-pay

## 2022-10-18 DIAGNOSIS — Z556 Problems related to health literacy: Secondary | ICD-10-CM | POA: Diagnosis not present

## 2022-10-18 DIAGNOSIS — M1990 Unspecified osteoarthritis, unspecified site: Secondary | ICD-10-CM | POA: Diagnosis not present

## 2022-10-18 DIAGNOSIS — I480 Paroxysmal atrial fibrillation: Secondary | ICD-10-CM | POA: Diagnosis not present

## 2022-10-18 DIAGNOSIS — Z7901 Long term (current) use of anticoagulants: Secondary | ICD-10-CM | POA: Diagnosis not present

## 2022-10-18 DIAGNOSIS — M545 Low back pain, unspecified: Secondary | ICD-10-CM | POA: Diagnosis not present

## 2022-10-18 DIAGNOSIS — I255 Ischemic cardiomyopathy: Secondary | ICD-10-CM | POA: Diagnosis not present

## 2022-10-18 DIAGNOSIS — J439 Emphysema, unspecified: Secondary | ICD-10-CM | POA: Diagnosis not present

## 2022-10-18 DIAGNOSIS — I714 Abdominal aortic aneurysm, without rupture, unspecified: Secondary | ICD-10-CM | POA: Diagnosis not present

## 2022-10-18 DIAGNOSIS — K219 Gastro-esophageal reflux disease without esophagitis: Secondary | ICD-10-CM | POA: Diagnosis not present

## 2022-10-18 DIAGNOSIS — Z992 Dependence on renal dialysis: Secondary | ICD-10-CM | POA: Diagnosis not present

## 2022-10-18 DIAGNOSIS — I420 Dilated cardiomyopathy: Secondary | ICD-10-CM | POA: Diagnosis not present

## 2022-10-18 DIAGNOSIS — M109 Gout, unspecified: Secondary | ICD-10-CM | POA: Diagnosis not present

## 2022-10-18 DIAGNOSIS — I252 Old myocardial infarction: Secondary | ICD-10-CM | POA: Diagnosis not present

## 2022-10-18 DIAGNOSIS — Z9181 History of falling: Secondary | ICD-10-CM | POA: Diagnosis not present

## 2022-10-18 DIAGNOSIS — I2510 Atherosclerotic heart disease of native coronary artery without angina pectoris: Secondary | ICD-10-CM | POA: Diagnosis not present

## 2022-10-18 DIAGNOSIS — E039 Hypothyroidism, unspecified: Secondary | ICD-10-CM | POA: Diagnosis not present

## 2022-10-18 DIAGNOSIS — M5136 Other intervertebral disc degeneration, lumbar region: Secondary | ICD-10-CM | POA: Diagnosis not present

## 2022-10-18 DIAGNOSIS — I5043 Acute on chronic combined systolic (congestive) and diastolic (congestive) heart failure: Secondary | ICD-10-CM | POA: Diagnosis not present

## 2022-10-18 DIAGNOSIS — D631 Anemia in chronic kidney disease: Secondary | ICD-10-CM | POA: Diagnosis not present

## 2022-10-18 DIAGNOSIS — E785 Hyperlipidemia, unspecified: Secondary | ICD-10-CM | POA: Diagnosis not present

## 2022-10-18 DIAGNOSIS — E1122 Type 2 diabetes mellitus with diabetic chronic kidney disease: Secondary | ICD-10-CM | POA: Diagnosis not present

## 2022-10-18 DIAGNOSIS — I132 Hypertensive heart and chronic kidney disease with heart failure and with stage 5 chronic kidney disease, or end stage renal disease: Secondary | ICD-10-CM | POA: Diagnosis not present

## 2022-10-18 DIAGNOSIS — J449 Chronic obstructive pulmonary disease, unspecified: Secondary | ICD-10-CM | POA: Diagnosis not present

## 2022-10-18 DIAGNOSIS — N186 End stage renal disease: Secondary | ICD-10-CM | POA: Diagnosis not present

## 2022-10-18 DIAGNOSIS — G8929 Other chronic pain: Secondary | ICD-10-CM | POA: Diagnosis not present

## 2022-10-18 MED ORDER — AMIODARONE HCL 200 MG PO TABS: 200.0000 mg | ORAL_TABLET | Freq: Every day | ORAL | 0 refills | Status: DC

## 2022-10-19 DIAGNOSIS — N186 End stage renal disease: Secondary | ICD-10-CM | POA: Diagnosis not present

## 2022-10-19 DIAGNOSIS — Z992 Dependence on renal dialysis: Secondary | ICD-10-CM | POA: Diagnosis not present

## 2022-10-20 DIAGNOSIS — N186 End stage renal disease: Secondary | ICD-10-CM | POA: Diagnosis not present

## 2022-10-20 DIAGNOSIS — Z992 Dependence on renal dialysis: Secondary | ICD-10-CM | POA: Diagnosis not present

## 2022-10-21 DIAGNOSIS — N186 End stage renal disease: Secondary | ICD-10-CM | POA: Diagnosis not present

## 2022-10-21 DIAGNOSIS — R7989 Other specified abnormal findings of blood chemistry: Secondary | ICD-10-CM | POA: Diagnosis not present

## 2022-10-21 DIAGNOSIS — Z992 Dependence on renal dialysis: Secondary | ICD-10-CM | POA: Diagnosis not present

## 2022-10-21 DIAGNOSIS — Z5181 Encounter for therapeutic drug level monitoring: Secondary | ICD-10-CM | POA: Diagnosis not present

## 2022-10-21 DIAGNOSIS — Z79899 Other long term (current) drug therapy: Secondary | ICD-10-CM | POA: Diagnosis not present

## 2022-10-22 DIAGNOSIS — N186 End stage renal disease: Secondary | ICD-10-CM | POA: Diagnosis not present

## 2022-10-22 DIAGNOSIS — Z992 Dependence on renal dialysis: Secondary | ICD-10-CM | POA: Diagnosis not present

## 2022-10-25 DIAGNOSIS — Z992 Dependence on renal dialysis: Secondary | ICD-10-CM | POA: Diagnosis not present

## 2022-10-25 DIAGNOSIS — N186 End stage renal disease: Secondary | ICD-10-CM | POA: Diagnosis not present

## 2022-10-27 DIAGNOSIS — Z992 Dependence on renal dialysis: Secondary | ICD-10-CM | POA: Diagnosis not present

## 2022-10-27 DIAGNOSIS — N186 End stage renal disease: Secondary | ICD-10-CM | POA: Diagnosis not present

## 2022-10-29 DIAGNOSIS — M545 Low back pain, unspecified: Secondary | ICD-10-CM | POA: Diagnosis not present

## 2022-10-29 DIAGNOSIS — Z556 Problems related to health literacy: Secondary | ICD-10-CM | POA: Diagnosis not present

## 2022-10-29 DIAGNOSIS — I420 Dilated cardiomyopathy: Secondary | ICD-10-CM | POA: Diagnosis not present

## 2022-10-29 DIAGNOSIS — I252 Old myocardial infarction: Secondary | ICD-10-CM | POA: Diagnosis not present

## 2022-10-29 DIAGNOSIS — K219 Gastro-esophageal reflux disease without esophagitis: Secondary | ICD-10-CM | POA: Diagnosis not present

## 2022-10-29 DIAGNOSIS — M5136 Other intervertebral disc degeneration, lumbar region: Secondary | ICD-10-CM | POA: Diagnosis not present

## 2022-10-29 DIAGNOSIS — E785 Hyperlipidemia, unspecified: Secondary | ICD-10-CM | POA: Diagnosis not present

## 2022-10-29 DIAGNOSIS — G8929 Other chronic pain: Secondary | ICD-10-CM | POA: Diagnosis not present

## 2022-10-29 DIAGNOSIS — I132 Hypertensive heart and chronic kidney disease with heart failure and with stage 5 chronic kidney disease, or end stage renal disease: Secondary | ICD-10-CM | POA: Diagnosis not present

## 2022-10-29 DIAGNOSIS — Z992 Dependence on renal dialysis: Secondary | ICD-10-CM | POA: Diagnosis not present

## 2022-10-29 DIAGNOSIS — D631 Anemia in chronic kidney disease: Secondary | ICD-10-CM | POA: Diagnosis not present

## 2022-10-29 DIAGNOSIS — I255 Ischemic cardiomyopathy: Secondary | ICD-10-CM | POA: Diagnosis not present

## 2022-10-29 DIAGNOSIS — I480 Paroxysmal atrial fibrillation: Secondary | ICD-10-CM | POA: Diagnosis not present

## 2022-10-29 DIAGNOSIS — M109 Gout, unspecified: Secondary | ICD-10-CM | POA: Diagnosis not present

## 2022-10-29 DIAGNOSIS — I714 Abdominal aortic aneurysm, without rupture, unspecified: Secondary | ICD-10-CM | POA: Diagnosis not present

## 2022-10-29 DIAGNOSIS — M1990 Unspecified osteoarthritis, unspecified site: Secondary | ICD-10-CM | POA: Diagnosis not present

## 2022-10-29 DIAGNOSIS — Z7901 Long term (current) use of anticoagulants: Secondary | ICD-10-CM | POA: Diagnosis not present

## 2022-10-29 DIAGNOSIS — I2510 Atherosclerotic heart disease of native coronary artery without angina pectoris: Secondary | ICD-10-CM | POA: Diagnosis not present

## 2022-10-29 DIAGNOSIS — J439 Emphysema, unspecified: Secondary | ICD-10-CM | POA: Diagnosis not present

## 2022-10-29 DIAGNOSIS — J449 Chronic obstructive pulmonary disease, unspecified: Secondary | ICD-10-CM | POA: Diagnosis not present

## 2022-10-29 DIAGNOSIS — Z9181 History of falling: Secondary | ICD-10-CM | POA: Diagnosis not present

## 2022-10-29 DIAGNOSIS — E039 Hypothyroidism, unspecified: Secondary | ICD-10-CM | POA: Diagnosis not present

## 2022-10-29 DIAGNOSIS — I5043 Acute on chronic combined systolic (congestive) and diastolic (congestive) heart failure: Secondary | ICD-10-CM | POA: Diagnosis not present

## 2022-10-29 DIAGNOSIS — E1122 Type 2 diabetes mellitus with diabetic chronic kidney disease: Secondary | ICD-10-CM | POA: Diagnosis not present

## 2022-10-29 DIAGNOSIS — N186 End stage renal disease: Secondary | ICD-10-CM | POA: Diagnosis not present

## 2022-10-30 HISTORY — PX: MINOR REMOVAL OF PERITONEAL DIALYSIS CATHETER: SHX6397

## 2022-11-01 DIAGNOSIS — E785 Hyperlipidemia, unspecified: Secondary | ICD-10-CM | POA: Diagnosis not present

## 2022-11-01 DIAGNOSIS — I120 Hypertensive chronic kidney disease with stage 5 chronic kidney disease or end stage renal disease: Secondary | ICD-10-CM | POA: Diagnosis not present

## 2022-11-01 DIAGNOSIS — Z992 Dependence on renal dialysis: Secondary | ICD-10-CM | POA: Diagnosis not present

## 2022-11-01 DIAGNOSIS — Z951 Presence of aortocoronary bypass graft: Secondary | ICD-10-CM | POA: Diagnosis not present

## 2022-11-01 DIAGNOSIS — Z7901 Long term (current) use of anticoagulants: Secondary | ICD-10-CM | POA: Diagnosis not present

## 2022-11-01 DIAGNOSIS — I25118 Atherosclerotic heart disease of native coronary artery with other forms of angina pectoris: Secondary | ICD-10-CM | POA: Diagnosis not present

## 2022-11-01 DIAGNOSIS — N186 End stage renal disease: Secondary | ICD-10-CM | POA: Diagnosis not present

## 2022-11-01 DIAGNOSIS — G4733 Obstructive sleep apnea (adult) (pediatric): Secondary | ICD-10-CM | POA: Diagnosis not present

## 2022-11-01 DIAGNOSIS — I10 Essential (primary) hypertension: Secondary | ICD-10-CM | POA: Diagnosis not present

## 2022-11-02 DIAGNOSIS — I420 Dilated cardiomyopathy: Secondary | ICD-10-CM | POA: Diagnosis not present

## 2022-11-02 DIAGNOSIS — Z556 Problems related to health literacy: Secondary | ICD-10-CM | POA: Diagnosis not present

## 2022-11-02 DIAGNOSIS — I2510 Atherosclerotic heart disease of native coronary artery without angina pectoris: Secondary | ICD-10-CM | POA: Diagnosis not present

## 2022-11-02 DIAGNOSIS — N186 End stage renal disease: Secondary | ICD-10-CM | POA: Diagnosis not present

## 2022-11-02 DIAGNOSIS — I132 Hypertensive heart and chronic kidney disease with heart failure and with stage 5 chronic kidney disease, or end stage renal disease: Secondary | ICD-10-CM | POA: Diagnosis not present

## 2022-11-02 DIAGNOSIS — M545 Low back pain, unspecified: Secondary | ICD-10-CM | POA: Diagnosis not present

## 2022-11-02 DIAGNOSIS — I252 Old myocardial infarction: Secondary | ICD-10-CM | POA: Diagnosis not present

## 2022-11-02 DIAGNOSIS — G8929 Other chronic pain: Secondary | ICD-10-CM | POA: Diagnosis not present

## 2022-11-02 DIAGNOSIS — E785 Hyperlipidemia, unspecified: Secondary | ICD-10-CM | POA: Diagnosis not present

## 2022-11-02 DIAGNOSIS — K219 Gastro-esophageal reflux disease without esophagitis: Secondary | ICD-10-CM | POA: Diagnosis not present

## 2022-11-02 DIAGNOSIS — J449 Chronic obstructive pulmonary disease, unspecified: Secondary | ICD-10-CM | POA: Diagnosis not present

## 2022-11-02 DIAGNOSIS — I480 Paroxysmal atrial fibrillation: Secondary | ICD-10-CM | POA: Diagnosis not present

## 2022-11-02 DIAGNOSIS — J439 Emphysema, unspecified: Secondary | ICD-10-CM | POA: Diagnosis not present

## 2022-11-02 DIAGNOSIS — I714 Abdominal aortic aneurysm, without rupture, unspecified: Secondary | ICD-10-CM | POA: Diagnosis not present

## 2022-11-02 DIAGNOSIS — M5136 Other intervertebral disc degeneration, lumbar region: Secondary | ICD-10-CM | POA: Diagnosis not present

## 2022-11-02 DIAGNOSIS — Z992 Dependence on renal dialysis: Secondary | ICD-10-CM | POA: Diagnosis not present

## 2022-11-02 DIAGNOSIS — Z7901 Long term (current) use of anticoagulants: Secondary | ICD-10-CM | POA: Diagnosis not present

## 2022-11-02 DIAGNOSIS — E039 Hypothyroidism, unspecified: Secondary | ICD-10-CM | POA: Diagnosis not present

## 2022-11-02 DIAGNOSIS — D631 Anemia in chronic kidney disease: Secondary | ICD-10-CM | POA: Diagnosis not present

## 2022-11-02 DIAGNOSIS — I255 Ischemic cardiomyopathy: Secondary | ICD-10-CM | POA: Diagnosis not present

## 2022-11-02 DIAGNOSIS — E1122 Type 2 diabetes mellitus with diabetic chronic kidney disease: Secondary | ICD-10-CM | POA: Diagnosis not present

## 2022-11-02 DIAGNOSIS — M1990 Unspecified osteoarthritis, unspecified site: Secondary | ICD-10-CM | POA: Diagnosis not present

## 2022-11-02 DIAGNOSIS — I5043 Acute on chronic combined systolic (congestive) and diastolic (congestive) heart failure: Secondary | ICD-10-CM | POA: Diagnosis not present

## 2022-11-02 DIAGNOSIS — M109 Gout, unspecified: Secondary | ICD-10-CM | POA: Diagnosis not present

## 2022-11-02 DIAGNOSIS — Z9181 History of falling: Secondary | ICD-10-CM | POA: Diagnosis not present

## 2022-11-03 DIAGNOSIS — N186 End stage renal disease: Secondary | ICD-10-CM | POA: Diagnosis not present

## 2022-11-03 DIAGNOSIS — Z992 Dependence on renal dialysis: Secondary | ICD-10-CM | POA: Diagnosis not present

## 2022-11-05 DIAGNOSIS — N186 End stage renal disease: Secondary | ICD-10-CM | POA: Diagnosis not present

## 2022-11-05 DIAGNOSIS — Z992 Dependence on renal dialysis: Secondary | ICD-10-CM | POA: Diagnosis not present

## 2022-11-07 ENCOUNTER — Other Ambulatory Visit: Payer: Self-pay | Admitting: Internal Medicine

## 2022-11-07 ENCOUNTER — Encounter: Payer: Self-pay | Admitting: Internal Medicine

## 2022-11-07 DIAGNOSIS — M109 Gout, unspecified: Secondary | ICD-10-CM

## 2022-11-07 DIAGNOSIS — E039 Hypothyroidism, unspecified: Secondary | ICD-10-CM

## 2022-11-07 MED ORDER — ALLOPURINOL 100 MG PO TABS
ORAL_TABLET | ORAL | 1 refills | Status: DC
Start: 2022-11-07 — End: 2023-07-08

## 2022-11-07 MED ORDER — LEVOTHYROXINE SODIUM 75 MCG PO TABS: 75.0000 ug | ORAL_TABLET | Freq: Every day | ORAL | 1 refills | Status: DC

## 2022-11-08 ENCOUNTER — Emergency Department: Payer: Medicare Other

## 2022-11-08 ENCOUNTER — Other Ambulatory Visit: Payer: Self-pay

## 2022-11-08 ENCOUNTER — Emergency Department
Admission: EM | Admit: 2022-11-08 | Discharge: 2022-11-08 | Disposition: A | Discharge status: Home / Self Care | Payer: Medicare Other | Attending: Emergency Medicine | Admitting: Emergency Medicine

## 2022-11-08 DIAGNOSIS — J309 Allergic rhinitis, unspecified: Secondary | ICD-10-CM | POA: Diagnosis not present

## 2022-11-08 DIAGNOSIS — J310 Chronic rhinitis: Secondary | ICD-10-CM

## 2022-11-08 DIAGNOSIS — Z992 Dependence on renal dialysis: Secondary | ICD-10-CM | POA: Diagnosis not present

## 2022-11-08 DIAGNOSIS — N186 End stage renal disease: Secondary | ICD-10-CM | POA: Diagnosis not present

## 2022-11-08 DIAGNOSIS — G44311 Acute post-traumatic headache, intractable: Secondary | ICD-10-CM

## 2022-11-08 DIAGNOSIS — R519 Headache, unspecified: Secondary | ICD-10-CM | POA: Diagnosis not present

## 2022-11-08 DIAGNOSIS — R000 Tachycardia, unspecified: Secondary | ICD-10-CM | POA: Diagnosis not present

## 2022-11-08 LAB — COMPREHENSIVE METABOLIC PANEL
ALT: 26 U/L (ref 0–44)
AST: 47 U/L — ABNORMAL HIGH (ref 15–41)
Albumin: 3.8 g/dL (ref 3.5–5.0)
Alkaline Phosphatase: 207 U/L — ABNORMAL HIGH (ref 38–126)
Anion gap: 13 (ref 5–15)
BUN: 16 mg/dL (ref 8–23)
CO2: 26 mmol/L (ref 22–32)
Calcium: 8.7 mg/dL — ABNORMAL LOW (ref 8.9–10.3)
Chloride: 101 mmol/L (ref 98–111)
Creatinine, Ser: 3.03 mg/dL — ABNORMAL HIGH (ref 0.61–1.24)
GFR, Estimated: 20 mL/min — ABNORMAL LOW (ref >60)
Glucose, Bld: 106 mg/dL — ABNORMAL HIGH (ref 70–99)
Potassium: 4 mmol/L (ref 3.5–5.1)
Sodium: 140 mmol/L (ref 135–145)
Total Bilirubin: 1.5 mg/dL — ABNORMAL HIGH (ref 0.3–1.2)
Total Protein: 7.2 g/dL (ref 6.5–8.1)

## 2022-11-08 LAB — TROPONIN I (HIGH SENSITIVITY): Troponin I (High Sensitivity): 27 ng/L — ABNORMAL HIGH (ref <18)

## 2022-11-08 LAB — CBC WITH DIFFERENTIAL/PLATELET
Abs Immature Granulocytes: 0.01 10*3/uL (ref 0.00–0.07)
Basophils Absolute: 0.1 10*3/uL (ref 0.0–0.1)
Basophils Relative: 1 %
Eosinophils Absolute: 0.3 10*3/uL (ref 0.0–0.5)
Eosinophils Relative: 6 %
HCT: 43.6 % (ref 39.0–52.0)
Hemoglobin: 13.4 g/dL (ref 13.0–17.0)
Immature Granulocytes: 0 %
Lymphocytes Relative: 21 %
Lymphs Abs: 1 10*3/uL (ref 0.7–4.0)
MCH: 27.6 pg (ref 26.0–34.0)
MCHC: 30.7 g/dL (ref 30.0–36.0)
MCV: 89.7 fL (ref 80.0–100.0)
Monocytes Absolute: 0.8 10*3/uL (ref 0.1–1.0)
Monocytes Relative: 16 %
Neutro Abs: 2.6 10*3/uL (ref 1.7–7.7)
Neutrophils Relative %: 56 %
Platelets: 157 10*3/uL (ref 150–400)
RBC: 4.86 MIL/uL (ref 4.22–5.81)
RDW: 19.6 % — ABNORMAL HIGH (ref 11.5–15.5)
WBC: 4.8 10*3/uL (ref 4.0–10.5)
nRBC: 0 % (ref 0.0–0.2)

## 2022-11-08 MED ORDER — FLUTICASONE PROPIONATE 50 MCG/ACT NA SUSP: 1.0000 | Freq: Two times a day (BID) | NASAL | 0 refills | Status: DC

## 2022-11-08 MED ORDER — DEXAMETHASONE SODIUM PHOSPHATE 10 MG/ML IJ SOLN
10.0000 mg | Freq: Once | INTRAMUSCULAR | Status: AC
  Administered 2022-11-08: 10 mg via INTRAMUSCULAR
  Filled 2022-11-08: qty 1

## 2022-11-08 MED ORDER — ONDANSETRON 8 MG PO TBDP
8.0000 mg | ORAL_TABLET | Freq: Once | ORAL | Status: AC
  Administered 2022-11-08: 8 mg via ORAL
  Filled 2022-11-08: qty 1

## 2022-11-08 MED ORDER — DIPHENHYDRAMINE HCL 25 MG PO CAPS
50.0000 mg | ORAL_CAPSULE | Freq: Once | ORAL | Status: AC
  Administered 2022-11-08: 50 mg via ORAL
  Filled 2022-11-08: qty 2

## 2022-11-08 NOTE — ED Triage Notes (Signed)
Pt presents to ER with c/o HA x1 week that started after he states he hit his head on a car lift.  Pt denies any hx of migraines or recurrent headaches.  Denies taking any blood thinners.  Pt also denies any kind of mew neurological symptoms since HA started.  Pt is currently A&O x4 and in NAD in triage.

## 2022-11-08 NOTE — ED Provider Notes (Signed)
[facility] Provider Note  Patient Contact: 10:42 PM (approximate)   History   Headache   HPI  Ronald [name] is a 77 y.o. male who presents the department complaining of a headache.  Patient had a headache on the right side, hit his head roughly a week ago on the left side.  He send no neurodeficits, no loss of consciousness initially or subsequently.  Patient is been taking Tylenol with limited improvement.  Patient also endorses some increased allergic rhinitis type symptoms which she feels could also contribute to his headache.  No fevers or chills.  No other complaints at this time.     Physical Exam   Triage Vital Signs: ED Triage Vitals  Enc Vitals Group     BP 11/08/22 2005 109/60     Pulse Rate 11/08/22 2005 (!) 105     Resp 11/08/22 2005 18     Temp 11/08/22 2005 97.7 F (36.5 C)     Temp Source 11/08/22 2005 Oral     SpO2 11/08/22 2005 97 %     Weight 11/08/22 2008 198 lb (89.8 kg)     Height 11/08/22 2008 5\' 9"  (1.753 m)     Head Circumference --      Peak Flow --      Pain Score 11/08/22 2008 9     Pain Loc --      Pain Edu? --      Excl. in GC? --     Most recent vital signs: Vitals:   11/08/22 2005  BP: 109/60  Pulse: (!) 105  Resp: 18  Temp: 97.7 F (36.5 C)  SpO2: 97%     General: Alert and in no acute distress. Eyes:  PERRL. EOMI. Head: No acute traumatic findings ENT:      Ears: EAC unremarkable bilaterally.  TM is mildly bulging on the right side.  No injection.      Nose: No congestion/rhinnorhea.      Mouth/Throat: Mucous membranes are moist.* Neck: No stridor. No cervical spine tenderness to palpation.  Cardiovascular:  Good peripheral perfusion Respiratory: Normal respiratory effort without tachypnea or retractions. Lungs CTAB. Musculoskeletal: Full range of motion to all extremities.  Neurologic:  No gross focal neurologic deficits are appreciated.  Skin:   No rash noted Other:   ED Results /  Procedures / Treatments   Labs (all labs ordered are listed, but only abnormal results are displayed) Labs Reviewed  CBC WITH DIFFERENTIAL/PLATELET - Abnormal; Notable for the following components:      Result Value   RDW 19.6 (*)    All other components within normal limits  COMPREHENSIVE METABOLIC PANEL - Abnormal; Notable for the following components:   Glucose, Bld 106 (*)    Creatinine, Ser 3.03 (*)    Calcium 8.7 (*)    AST 47 (*)    Alkaline Phosphatase 207 (*)    Total Bilirubin 1.5 (*)    GFR, Estimated 20 (*)    All other components within normal limits  TROPONIN I (HIGH SENSITIVITY) - Abnormal; Notable for the following components:   Troponin I (High Sensitivity) 27 (*)    All other components within normal limits  TROPONIN I (HIGH SENSITIVITY)     EKG     RADIOLOGY  I personally viewed, evaluated, and interpreted these images as part of my medical decision making, as well as reviewing the written report by the radiologist.  ED Provider Interpretation: No acute traumatic finding to  the skull or intracranial space.  CT HEAD WO CONTRAST ( )  Result Date: 11/08/2022 CLINICAL DATA:  Headache, new onset (Age >= 51y) EXAM: CT HEAD WITHOUT CONTRAST TECHNIQUE: Contiguous axial images were obtained from the base of the skull through the vertex without intravenous contrast. RADIATION DOSE REDUCTION: This exam was performed according to the departmental dose-optimization program which includes automated exposure control, adjustment of the mA and/or kV according to patient size and/or use of iterative reconstruction technique. COMPARISON:  None Available. FINDINGS: Brain: No evidence of acute infarction, hemorrhage, hydrocephalus, extra-axial collection or mass lesion/mass effect. Mild parenchymal volume loss is commensurate with the patient's age. Vascular: No hyperdense vessel or unexpected calcification. Skull: Normal. Negative for fracture or focal lesion. Sinuses/Orbits:  No acute finding. Other: Mastoid air cells and middle ear cavities are clear. IMPRESSION: 1. No acute intracranial abnormality. Electronically Signed   By: Helyn Numbers M.D.   On: 11/08/2022 20:50    PROCEDURES:  Critical Care performed: No  Procedures   MEDICATIONS ORDERED IN ED: Medications  dexamethasone (DECADRON) injection 10 mg (has no administration in time range)  diphenhydrAMINE (BENADRYL) capsule 50 mg (has no administration in time range)  ondansetron (ZOFRAN-ODT) disintegrating tablet 8 mg (has no administration in time range)     IMPRESSION / MDM / ASSESSMENT AND PLAN / ED COURSE  I reviewed the triage vital signs and the nursing notes.                                 Differential diagnosis includes, but is not limited to, traumatic head injury, skull fracture, intracranial hemorrhage, posttraumatic headache, tension type headache, sinus headache, allergic rhinitis, sinusitis   Patient's presentation is most consistent with acute presentation with potential threat to life or bodily function.   Patient's diagnosis is consistent with headache, allergic rhinitis.  Patient presents emergency department with complaint of headache after hitting his head roughly a week ago.  There is been no concerning neurosymptoms, no large area of edema, ecchymosis or abrasions to the area.  Patient has been having a headache that is been intermittently treated with Tylenol.  CT scan was reassuring with no acute intracranial abnormality or skull fracture.  Patient had labs which are reassuring.  Patient did have some allergic rhinitis type symptoms but does not take his allergy medications.  Suspect that the headache is combination posttraumatic plus his allergic rhinitis.  Recommend restarting his allergy medications.  Will give modified migraine cocktail as he is on dialysis.  Will treat with steroid, Zofran and Benadryl.  Follow-up primary care as needed.  Return precautions discussed with  the patient and his wife..  Patient is given ED precautions to return to the ED for any worsening or new symptoms.     FINAL CLINICAL IMPRESSION(S) / ED DIAGNOSES   Final diagnoses:  Intractable acute post-traumatic headache  Chronic rhinitis     Rx / DC Orders   ED Discharge Orders          Ordered    fluticasone (FLONASE) 50 MCG/ACT nasal spray  2 times daily        11/08/22 2248             Note:  This document was prepared using Dragon voice recognition software and may include unintentional dictation errors.   Lanette Hampshire 11/08/22 2248    Pilar Jarvis, MD 11/08/22 [phone]

## 2022-11-09 DIAGNOSIS — I120 Hypertensive chronic kidney disease with stage 5 chronic kidney disease or end stage renal disease: Secondary | ICD-10-CM | POA: Diagnosis not present

## 2022-11-09 DIAGNOSIS — I10 Essential (primary) hypertension: Secondary | ICD-10-CM | POA: Diagnosis not present

## 2022-11-09 DIAGNOSIS — Z01818 Encounter for other preprocedural examination: Secondary | ICD-10-CM | POA: Diagnosis not present

## 2022-11-09 DIAGNOSIS — G4733 Obstructive sleep apnea (adult) (pediatric): Secondary | ICD-10-CM | POA: Diagnosis not present

## 2022-11-09 DIAGNOSIS — Z951 Presence of aortocoronary bypass graft: Secondary | ICD-10-CM | POA: Diagnosis not present

## 2022-11-09 DIAGNOSIS — E785 Hyperlipidemia, unspecified: Secondary | ICD-10-CM | POA: Diagnosis not present

## 2022-11-09 DIAGNOSIS — I2510 Atherosclerotic heart disease of native coronary artery without angina pectoris: Secondary | ICD-10-CM | POA: Diagnosis not present

## 2022-11-10 ENCOUNTER — Emergency Department
Admission: EM | Admit: 2022-11-10 | Discharge: 2022-11-11 | Disposition: A | Discharge status: Home / Self Care | Payer: Medicare Other | Attending: Emergency Medicine | Admitting: Emergency Medicine

## 2022-11-10 ENCOUNTER — Other Ambulatory Visit: Payer: Self-pay

## 2022-11-10 ENCOUNTER — Encounter: Payer: Self-pay | Admitting: Internal Medicine

## 2022-11-10 ENCOUNTER — Other Ambulatory Visit (INDEPENDENT_AMBULATORY_CARE_PROVIDER_SITE_OTHER): Payer: Medicare Other | Admitting: Internal Medicine

## 2022-11-10 ENCOUNTER — Emergency Department: Payer: Medicare Other

## 2022-11-10 DIAGNOSIS — I480 Paroxysmal atrial fibrillation: Secondary | ICD-10-CM

## 2022-11-10 DIAGNOSIS — I25118 Atherosclerotic heart disease of native coronary artery with other forms of angina pectoris: Secondary | ICD-10-CM

## 2022-11-10 DIAGNOSIS — S0003XA Contusion of scalp, initial encounter: Secondary | ICD-10-CM | POA: Insufficient documentation

## 2022-11-10 DIAGNOSIS — Z992 Dependence on renal dialysis: Secondary | ICD-10-CM | POA: Diagnosis not present

## 2022-11-10 DIAGNOSIS — W28XXXA Contact with powered lawn mower, initial encounter: Secondary | ICD-10-CM | POA: Insufficient documentation

## 2022-11-10 DIAGNOSIS — I5042 Chronic combined systolic (congestive) and diastolic (congestive) heart failure: Secondary | ICD-10-CM | POA: Diagnosis not present

## 2022-11-10 DIAGNOSIS — Z7901 Long term (current) use of anticoagulants: Secondary | ICD-10-CM | POA: Diagnosis not present

## 2022-11-10 DIAGNOSIS — W19XXXA Unspecified fall, initial encounter: Secondary | ICD-10-CM

## 2022-11-10 DIAGNOSIS — M542 Cervicalgia: Secondary | ICD-10-CM | POA: Diagnosis not present

## 2022-11-10 DIAGNOSIS — S41112A Laceration without foreign body of left upper arm, initial encounter: Secondary | ICD-10-CM | POA: Insufficient documentation

## 2022-11-10 DIAGNOSIS — Z951 Presence of aortocoronary bypass graft: Secondary | ICD-10-CM | POA: Diagnosis not present

## 2022-11-10 DIAGNOSIS — G4733 Obstructive sleep apnea (adult) (pediatric): Secondary | ICD-10-CM

## 2022-11-10 DIAGNOSIS — I714 Abdominal aortic aneurysm, without rupture, unspecified: Secondary | ICD-10-CM

## 2022-11-10 DIAGNOSIS — M1990 Unspecified osteoarthritis, unspecified site: Secondary | ICD-10-CM

## 2022-11-10 DIAGNOSIS — J449 Chronic obstructive pulmonary disease, unspecified: Secondary | ICD-10-CM | POA: Insufficient documentation

## 2022-11-10 DIAGNOSIS — I120 Hypertensive chronic kidney disease with stage 5 chronic kidney disease or end stage renal disease: Secondary | ICD-10-CM

## 2022-11-10 DIAGNOSIS — I7121 Aneurysm of the ascending aorta, without rupture: Secondary | ICD-10-CM

## 2022-11-10 DIAGNOSIS — R29898 Other symptoms and signs involving the musculoskeletal system: Secondary | ICD-10-CM

## 2022-11-10 DIAGNOSIS — N186 End stage renal disease: Secondary | ICD-10-CM | POA: Insufficient documentation

## 2022-11-10 DIAGNOSIS — Z955 Presence of coronary angioplasty implant and graft: Secondary | ICD-10-CM | POA: Insufficient documentation

## 2022-11-10 DIAGNOSIS — Z79899 Other long term (current) drug therapy: Secondary | ICD-10-CM | POA: Insufficient documentation

## 2022-11-10 DIAGNOSIS — M109 Gout, unspecified: Secondary | ICD-10-CM

## 2022-11-10 DIAGNOSIS — R519 Headache, unspecified: Secondary | ICD-10-CM | POA: Diagnosis not present

## 2022-11-10 DIAGNOSIS — E782 Mixed hyperlipidemia: Secondary | ICD-10-CM

## 2022-11-10 DIAGNOSIS — Z7951 Long term (current) use of inhaled steroids: Secondary | ICD-10-CM | POA: Insufficient documentation

## 2022-11-10 DIAGNOSIS — I2510 Atherosclerotic heart disease of native coronary artery without angina pectoris: Secondary | ICD-10-CM | POA: Diagnosis not present

## 2022-11-10 DIAGNOSIS — E1122 Type 2 diabetes mellitus with diabetic chronic kidney disease: Secondary | ICD-10-CM | POA: Diagnosis not present

## 2022-11-10 DIAGNOSIS — I483 Typical atrial flutter: Secondary | ICD-10-CM

## 2022-11-10 DIAGNOSIS — S0990XA Unspecified injury of head, initial encounter: Secondary | ICD-10-CM | POA: Diagnosis not present

## 2022-11-10 DIAGNOSIS — I420 Dilated cardiomyopathy: Secondary | ICD-10-CM

## 2022-11-10 DIAGNOSIS — I4891 Unspecified atrial fibrillation: Secondary | ICD-10-CM

## 2022-11-10 DIAGNOSIS — I5043 Acute on chronic combined systolic (congestive) and diastolic (congestive) heart failure: Secondary | ICD-10-CM

## 2022-11-10 DIAGNOSIS — N2581 Secondary hyperparathyroidism of renal origin: Secondary | ICD-10-CM

## 2022-11-10 DIAGNOSIS — M48061 Spinal stenosis, lumbar region without neurogenic claudication: Secondary | ICD-10-CM

## 2022-11-10 DIAGNOSIS — I10 Essential (primary) hypertension: Secondary | ICD-10-CM

## 2022-11-10 DIAGNOSIS — S50312A Abrasion of left elbow, initial encounter: Secondary | ICD-10-CM | POA: Diagnosis not present

## 2022-11-10 DIAGNOSIS — M4312 Spondylolisthesis, cervical region: Secondary | ICD-10-CM | POA: Diagnosis not present

## 2022-11-10 DIAGNOSIS — I132 Hypertensive heart and chronic kidney disease with heart failure and with stage 5 chronic kidney disease, or end stage renal disease: Secondary | ICD-10-CM | POA: Insufficient documentation

## 2022-11-10 DIAGNOSIS — R7303 Prediabetes: Secondary | ICD-10-CM

## 2022-11-10 DIAGNOSIS — T07XXXA Unspecified multiple injuries, initial encounter: Secondary | ICD-10-CM

## 2022-11-10 DIAGNOSIS — S41111A Laceration without foreign body of right upper arm, initial encounter: Secondary | ICD-10-CM | POA: Diagnosis not present

## 2022-11-10 DIAGNOSIS — J439 Emphysema, unspecified: Secondary | ICD-10-CM | POA: Insufficient documentation

## 2022-11-10 DIAGNOSIS — I70213 Atherosclerosis of native arteries of extremities with intermittent claudication, bilateral legs: Secondary | ICD-10-CM

## 2022-11-10 DIAGNOSIS — D6869 Other thrombophilia: Secondary | ICD-10-CM | POA: Insufficient documentation

## 2022-11-10 DIAGNOSIS — I701 Atherosclerosis of renal artery: Secondary | ICD-10-CM

## 2022-11-10 DIAGNOSIS — R5381 Other malaise: Secondary | ICD-10-CM

## 2022-11-10 DIAGNOSIS — N189 Chronic kidney disease, unspecified: Secondary | ICD-10-CM

## 2022-11-10 NOTE — ED Provider Notes (Signed)
[facility] Provider Note    Event Date/Time   First MD Initiated Contact with Patient 11/10/22 2348     (approximate)   History   Fall   HPI  Ronald [name] is a 77 y.o. male who presents to the ED from home with a chief complaint of fall.  Patient was getting off his lawnmower when his leg buckled beneath him and he fell, striking his left head against the porch post.  This happened approximately 7 PM.  Denies LOC.  Patient does take Eliquis for atrial fibrillation.  Denies vision changes, neck pain, headache, chest pain, shortness of breath, nausea, vomiting or dizziness.  Presents with abrasions to multiple sites.  Tetanus is up-to-date, less than 5 years.     Past Medical History   Past Medical History:  Diagnosis Date   AAA (abdominal aortic aneurysm) (HCC)    a. 3cm by [place] 2015.   Arthritis    "hips; back" (12/13/2014)   CAD (coronary artery disease) 2007   a. s/p CABG- IMA-LAD, VG-Cx, VG-RCA, VG-diag in 1999. B. sp redo CABG- VG-OM, VG-RCA in 2007 due to VG disease. c. NSTEMI 11/2014 s/p DES to SVG-OM from the Y graft.d. PTCA/DES x 1 distal body of SVG to Diagonal.09/2015   Chronic combined systolic and diastolic CHF (congestive heart failure) (HCC)    a. remote EF 40-45% in 2006. b. Normal EF 2014. b. Echo 07/2016 EF 45-50%, grade 1 DD. c. Echo 2020 30% to 35%. Diffuse EF 30-35%, diffuse hypokinesis.   Chronic lower back pain    CKD (chronic kidney disease), stage IV (HCC)    COPD (chronic obstructive pulmonary disease) (HCC)    Deafness in left ear    Degenerative disc disease, lumbar    Diabetes mellitus, type 2 (HCC) 10/04/2014   Microalbumin 05/11/2012-100. Foot exam/monofilament 05/11/2012-normal.   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 noted on CT 2008.   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   Physical deconditioning 09/16/2022   Bilateral leg edema 09/13/2022   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) 07/22/2022   Coronary artery disease of native artery of native heart with stable angina pectoris (HCC) 07/22/2022   OSA (obstructive sleep apnea) 07/22/2022   Chronic systolic CHF (congestive heart failure) (HCC) 07/15/2022   Typical atrial flutter (HCC) 07/15/2022   Acute on chronic combined systolic (congestive) and diastolic (congestive) heart failure (HCC) 05/28/2022   Hypothyroidism 05/28/2022   GERD without esophagitis 05/28/2022   Essential hypertension 05/05/2022   Prediabetes 04/06/2022  Spinal stenosis of lumbar region without neurogenic claudication 04/06/2022   Weakness of both lower extremities 04/06/2022   Morbid obesity (HCC) 04/06/2022   Upper airway cough syndrome 10/01/2021   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   Bursitis of hip 12/18/2018   Paroxysmal atrial fibrillation (HCC) 06/10/2018   Atherosclerosis of native arteries of extremity with intermittent claudication (HCC) 06/25/2016   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   Coronary artery disease involving native coronary artery with angina pectoris (HCC)    Allergic rhinitis 08/19/2015    Anemia of renal disease 10/04/2014   AA (aortic aneurysm) (HCC) 10/04/2014   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   Basal cell papilloma 10/04/2014   Unspecified osteoarthritis, unspecified site 10/04/2014   Thoracic aortic aneurysm (TAA) (HCC) 10/04/2014   Atherosclerotic heart disease of native coronary artery without angina pectoris 10/04/2014   Benign prostatic hyperplasia with lower urinary tract symptoms 10/24/2012   DOE (dyspnea on exertion) 11/12/2009   Renal artery stenosis     Mixed hyperlipidemia 09/04/2008   History of redo bypass grafting 09/04/2008     Past Surgical History   Past Surgical History:  Procedure Laterality Date   CARDIAC CATHETERIZATION  "several"   CARDIAC CATHETERIZATION N/A 12/13/2014   Procedure: Left Heart Cath and Coronary Angiography;  Surgeon: Corky Crafts, MD;  Location: [facility] INVASIVE CV LAB;  Service: Cardiovascular;  Laterality: N/A;   CARDIAC CATHETERIZATION  12/13/2014   Procedure: Coronary Stent Intervention;  Surgeon: Corky Crafts, MD;  Location: [facility] INVASIVE CV LAB;  Service: Cardiovascular;;   CARDIAC CATHETERIZATION N/A 10/07/2015   Procedure: Left Heart Cath and Cors/Grafts Angiography;  Surgeon: Kathleene Hazel, MD;  Location: [facility] INVASIVE CV LAB;  Service: Cardiovascular;  Laterality: N/A;   CARDIAC CATHETERIZATION N/A 10/07/2015   Procedure: Coronary Stent Intervention;  Surgeon: Kathleene Hazel, MD;  Location: [facility] INVASIVE CV LAB;  Service: Cardiovascular;  Laterality: N/A;   CARDIOVERSION N/A 05/25/2022   Procedure: CARDIOVERSION;  Surgeon: Thomasene Ripple, DO;  Location: [facility] ENDOSCOPY;  Service: Cardiovascular;  Laterality: N/A;   CATARACT EXTRACTION W/PHACO Left 10/21/2021   Procedure: CATARACT EXTRACTION PHACO AND INTRAOCULAR LENS PLACEMENT (IOC) LEFT 3.94 00:33.7;  Surgeon: Nevada Crane, MD;  Location: [facility];  Service: Ophthalmology;  Laterality: Left;   CATARACT EXTRACTION W/PHACO Right 11/09/2021    Procedure: CATARACT EXTRACTION PHACO AND INTRAOCULAR LENS PLACEMENT (IOC) RIGHT;  Surgeon: Nevada Crane, MD;  Location: [facility];  Service: Ophthalmology;  Laterality: Right;  3.59 0:29.4   CORONARY ANGIOPLASTY  "several"   CORONARY ANGIOPLASTY WITH STENT PLACEMENT  2005; 12/13/2014   "2; 1"   CORONARY ARTERY BYPASS GRAFT  1996   CABG X5   CORONARY ARTERY BYPASS GRAFT  March 2007   CABG X3   DIALYSIS/PERMA CATHETER INSERTION N/A 09/10/2022   Procedure: DIALYSIS/PERMA CATHETER INSERTION;  Surgeon: Renford Dills, MD;  Location: [facility] INVASIVE CV LAB;  Service: Cardiovascular;  Laterality: N/A;   ESOPHAGOGASTRODUODENOSCOPY (EGD) WITH ESOPHAGEAL DILATION  2000   GREEN LIGHT LASER TURP (TRANSURETHRAL RESECTION OF PROSTATE  2000's   "not cancerous"   HERNIA REPAIR     LAPAROSCOPIC CHOLECYSTECTOMY     LEFT HEART CATH AND CORS/GRAFTS ANGIOGRAPHY N/A 07/28/2017   Procedure: LEFT HEART CATH AND CORS/GRAFTS ANGIOGRAPHY;  Surgeon: Lennette Bihari, MD;  Location: [facility] INVASIVE CV LAB;  Service: Cardiovascular;  Laterality: N/A;   LUNG SURGERY  1996   "S/P CABG, had to put staple in lung after it had collapsed"  UMBILICAL HERNIA REPAIR     w/chole     Home Medications   Prior to Admission medications   Medication Sig Start Date End Date Taking? Authorizing Provider  acetaminophen (TYLENOL) 650 MG CR tablet Take 1,300 mg by mouth at bedtime.    [provider]  allopurinol (ZYLOPRIM) 100 MG tablet TAKE 1 TABLET BY MOUTH EVERYDAY AT BEDTIME 11/07/22   Reubin Milan, MD  amiodarone (PACERONE) 200 MG tablet Take 1 tablet (200 mg total) by mouth daily. Please keep scheduled appointment for future refills. Thank you. 10/18/22   Kathleene Hazel, MD  apixaban (ELIQUIS) 5 MG TABS tablet Take 1 tablet (5 mg total) by mouth 2 (two) times daily. 09/20/22   Kathleene Hazel, MD  atorvastatin (LIPITOR) 40 MG tablet Take 1 tablet (40 mg total) by mouth at bedtime. 09/08/22    Reubin Milan, MD  Cholecalciferol (VITAMIN D-3) 125 MCG (5000 UT) TABS Take 2,000 Units by mouth daily.     [provider]  diphenhydrAMINE (BENADRYL) 25 mg capsule Take 25 mg by mouth every 6 (six) hours as needed.    [provider]  fluticasone (FLONASE) 50 MCG/ACT nasal spray Place 1 spray into both nostrils 2 (two) times daily. 11/08/22   Cuthriell, Delorise Royals, PA-C  isosorbide mononitrate (IMDUR) 60 MG 24 hr tablet Take 1 tablet (60 mg total) by mouth daily. Increased from 30 mg. Patient taking differently: Take 60 mg by mouth daily. 07/17/22 10/15/22  Darlin Priestly, MD  levothyroxine (SYNTHROID) 75 MCG tablet Take 1 tablet (75 mcg total) by mouth daily before breakfast. 11/07/22   Reubin Milan, MD  metoprolol tartrate (LOPRESSOR) 50 MG tablet Take 1 tablet (50 mg total) by mouth 2 (two) times daily. 09/16/22 12/15/22  Lucile Shutters, MD  Multiple Vitamin (MULTIVITAMIN PO) Take 1 tablet by mouth daily.    [provider]  nitroGLYCERIN (NITROSTAT) 0.4 MG SL tablet Place 0.4 mg under the tongue every 5 (five) minutes as needed for chest pain (Up to 3 times).    [provider]  pantoprazole (PROTONIX) 40 MG tablet Take 1 tablet (40 mg total) by mouth 2 (two) times daily. Home med. 07/17/22   Darlin Priestly, MD  polyethylene glycol (MIRALAX / GLYCOLAX) 17 g packet Take 17 g by mouth at bedtime.    [provider]  senna (SENOKOT) 8.6 MG tablet Take 2 tablets by mouth 2 (two) times daily.    [provider]  sodium bicarbonate 650 MG tablet Take 650 mg by mouth 2 (two) times daily.    [provider]     Allergies  Predicort [prednisolone], Ciprofloxacin, Hydrochlorothiazide, Hydrocodone, Hydrocodone-acetaminophen, Sulfa antibiotics, and Penicillins   Family History   Family History  Problem Relation Age of Onset   Heart attack Mother        MI   Stroke Mother    Heart disease Mother    Hypertension Mother    Hyperlipidemia  Mother    Asthma Mother    Heart disease Father    Rheumatic fever Father    Colon cancer Neg Hx      Physical Exam  Triage Vital Signs: ED Triage Vitals [11/10/22 2126]  Enc Vitals Group     BP 116/61     Pulse Rate (!) 107     Resp 20     Temp 97.7 F (36.5 C)     Temp Source Oral     SpO2 98 %  Weight      Height      Head Circumference      Peak Flow      Pain Score 0     Pain Loc      Pain Edu?      Excl. in GC?     Updated Vital Signs: BP 112/68 (BP Location: Right Arm)   Pulse (!) 109   Temp 97.6 F (36.4 C) (Oral)   Resp 20   SpO2 97%    General: Awake, no distress.  CV:  RRR.  Good peripheral perfusion.  Resp:  Normal effort.  CTAB. Abd:  Nontender.  No distention.  Other:  Slight abrasion and hematoma to left parietal scalp without active bleeding.  PERRL.  EOMI.  Nose is atraumatic.  No dental malocclusion.  No midline cervical spine tenderness to palpation, step-offs or deformities.  Multiple skin tears to bilateral upper extremities.   ED Results / Procedures / Treatments  Labs (all labs ordered are listed, but only abnormal results are displayed) Labs Reviewed - No data to display   EKG  None   RADIOLOGY I have independently visualized and interpreted patient's CT scans as well as noted the radiology interpretation:  CT head/cervical spine: No acute traumatic injury  Official radiology report(s): CT Head Wo Contrast  Result Date: 11/10/2022 CLINICAL DATA:  Recent fall with headaches and neck pain, initial encounter EXAM: CT HEAD WITHOUT CONTRAST CT CERVICAL SPINE WITHOUT CONTRAST TECHNIQUE: Multidetector CT imaging of the head and cervical spine was performed following the standard protocol without intravenous contrast. Multiplanar CT image reconstructions of the cervical spine were also generated. RADIATION DOSE REDUCTION: This exam was performed according to the departmental dose-optimization program which includes automated exposure  control, adjustment of the mA and/or kV according to patient size and/or use of iterative reconstruction technique. COMPARISON:  11/08/2022 FINDINGS: CT HEAD FINDINGS Brain: No evidence of acute infarction, hemorrhage, hydrocephalus, extra-axial collection or mass lesion/mass effect. Vascular: No hyperdense vessel or unexpected calcification. Skull: Normal. Negative for fracture or focal lesion. Sinuses/Orbits: No acute finding. Other: None. CT CERVICAL SPINE FINDINGS Alignment: Mild degenerative anterolisthesis of C4 on C5 and C5 on C6 is noted. Skull base and vertebrae: No acute fracture or acute facet abnormality is noted. Seven cervical segments are well visualized. Multilevel facet hypertrophic changes are noted contributing to the anterolisthesis. Soft tissues and spinal canal: Surrounding soft tissue structures are within normal limits. Right jugular dialysis catheter is seen. Upper chest: Visualized lung apices are within normal limits. Other: None IMPRESSION: CT of the head: No acute intracranial abnormality noted. CT of the cervical spine: Degenerative change without acute abnormality. Electronically Signed   By: Alcide Clever M.D.   On: 11/10/2022 21:57   CT Cervical Spine Wo Contrast  Result Date: 11/10/2022 CLINICAL DATA:  Recent fall with headaches and neck pain, initial encounter EXAM: CT HEAD WITHOUT CONTRAST CT CERVICAL SPINE WITHOUT CONTRAST TECHNIQUE: Multidetector CT imaging of the head and cervical spine was performed following the standard protocol without intravenous contrast. Multiplanar CT image reconstructions of the cervical spine were also generated. RADIATION DOSE REDUCTION: This exam was performed according to the departmental dose-optimization program which includes automated exposure control, adjustment of the mA and/or kV according to patient size and/or use of iterative reconstruction technique. COMPARISON:  11/08/2022 FINDINGS: CT HEAD FINDINGS Brain: No evidence of acute  infarction, hemorrhage, hydrocephalus, extra-axial collection or mass lesion/mass effect. Vascular: No hyperdense vessel or unexpected calcification. Skull: Normal. Negative for fracture  or focal lesion. Sinuses/Orbits: No acute finding. Other: None. CT CERVICAL SPINE FINDINGS Alignment: Mild degenerative anterolisthesis of C4 on C5 and C5 on C6 is noted. Skull base and vertebrae: No acute fracture or acute facet abnormality is noted. Seven cervical segments are well visualized. Multilevel facet hypertrophic changes are noted contributing to the anterolisthesis. Soft tissues and spinal canal: Surrounding soft tissue structures are within normal limits. Right jugular dialysis catheter is seen. Upper chest: Visualized lung apices are within normal limits. Other: None IMPRESSION: CT of the head: No acute intracranial abnormality noted. CT of the cervical spine: Degenerative change without acute abnormality. Electronically Signed   By: Alcide Clever M.D.   On: 11/10/2022 21:57     PROCEDURES:  Critical Care performed: No  Procedures   MEDICATIONS ORDERED IN ED: Medications - No data to display   IMPRESSION / MDM / ASSESSMENT AND PLAN / ED COURSE  I reviewed the triage vital signs and the nursing notes.                             77 year old male presenting status post mechanical fall with head injury and skin abrasions.  Differential diagnosis includes but is not limited to ICH, SDH, cervical spine injury, etc.  I personally reviewed patient's records and note patient is scheduled for peritoneal removal tomorrow by general surgery.  Patient's presentation is most consistent with acute complicated illness / injury requiring diagnostic workup.  CT scans negative for acute traumatic injury.  Nursing to clean and dress wounds with nonstick dressing.  Strict return precautions given.  Patient and spouse verbalized understanding agree with plan of care.      FINAL CLINICAL IMPRESSION(S) / ED  DIAGNOSES   Final diagnoses:  Fall, initial encounter  Abrasions of multiple sites  Minor head injury, initial encounter     Rx / DC Orders   ED Discharge Orders     None        Note:  This document was prepared using Dragon voice recognition software and may include unintentional dictation errors.   Irean Hong, MD 11/11/22 [phone]

## 2022-11-10 NOTE — Progress Notes (Signed)
Received home health orders orders from [facility]. Start of care 09/20/22.   Certification and orders from 09/20/22 through 11/18/22 are reviewed, signed and faxed back to home health company.  Need of intermittent skilled services at home:   The home health care plan has been established by me and will be reviewed and updated as needed to maximize patient recovery.  I certify that all home health services have been and will be furnished to the patient while under my care.  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)   Renal artery stenosis  (Chronic)   Prediabetes (Chronic)   Physical deconditioning   Paroxysmal atrial fibrillation (HCC) (Chronic)   OSA (obstructive sleep apnea)   Morbid obesity (HCC)   Mixed hyperlipidemia (Chronic)   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

## 2022-11-10 NOTE — Discharge Instructions (Signed)
Keep wounds clean and dry.  Return to the ER for worsening symptoms, persistent vomiting, lethargy or other concerns.

## 2022-11-10 NOTE — ED Triage Notes (Addendum)
Pt to ED via POV c/o fall. Pt was outside when leg gave out and pt fell, hitting head on porch post. Pt is on eliquis. Pt has small cut to scalp, no bleeding. Pt has small cit to scalp, no bleeding and skin tears to left hand, right hand, and left elbow. Pt not complaining of any pain. No dizziness prior to or after fall. Pt A&Ox4, acting appropriately, pupils equal and reactive

## 2022-11-11 ENCOUNTER — Ambulatory Visit: Payer: Medicare Other | Attending: Family | Admitting: Family

## 2022-11-11 ENCOUNTER — Encounter: Payer: Self-pay | Admitting: Family

## 2022-11-11 VITALS — BP 110/63 | HR 86 | Wt 199.6 lb

## 2022-11-11 DIAGNOSIS — E785 Hyperlipidemia, unspecified: Secondary | ICD-10-CM | POA: Insufficient documentation

## 2022-11-11 DIAGNOSIS — N185 Chronic kidney disease, stage 5: Secondary | ICD-10-CM | POA: Diagnosis not present

## 2022-11-11 DIAGNOSIS — S0083XA Contusion of other part of head, initial encounter: Secondary | ICD-10-CM | POA: Insufficient documentation

## 2022-11-11 DIAGNOSIS — Z79899 Other long term (current) drug therapy: Secondary | ICD-10-CM | POA: Insufficient documentation

## 2022-11-11 DIAGNOSIS — I10 Essential (primary) hypertension: Secondary | ICD-10-CM | POA: Diagnosis not present

## 2022-11-11 DIAGNOSIS — Z8249 Family history of ischemic heart disease and other diseases of the circulatory system: Secondary | ICD-10-CM | POA: Insufficient documentation

## 2022-11-11 DIAGNOSIS — I714 Abdominal aortic aneurysm, without rupture, unspecified: Secondary | ICD-10-CM | POA: Insufficient documentation

## 2022-11-11 DIAGNOSIS — N186 End stage renal disease: Secondary | ICD-10-CM | POA: Insufficient documentation

## 2022-11-11 DIAGNOSIS — I252 Old myocardial infarction: Secondary | ICD-10-CM | POA: Insufficient documentation

## 2022-11-11 DIAGNOSIS — I480 Paroxysmal atrial fibrillation: Secondary | ICD-10-CM | POA: Diagnosis not present

## 2022-11-11 DIAGNOSIS — W19XXXA Unspecified fall, initial encounter: Secondary | ICD-10-CM | POA: Insufficient documentation

## 2022-11-11 DIAGNOSIS — I081 Rheumatic disorders of both mitral and tricuspid valves: Secondary | ICD-10-CM | POA: Insufficient documentation

## 2022-11-11 DIAGNOSIS — E1122 Type 2 diabetes mellitus with diabetic chronic kidney disease: Secondary | ICD-10-CM | POA: Insufficient documentation

## 2022-11-11 DIAGNOSIS — Z823 Family history of stroke: Secondary | ICD-10-CM | POA: Insufficient documentation

## 2022-11-11 DIAGNOSIS — M109 Gout, unspecified: Secondary | ICD-10-CM | POA: Insufficient documentation

## 2022-11-11 DIAGNOSIS — Z7901 Long term (current) use of anticoagulants: Secondary | ICD-10-CM | POA: Insufficient documentation

## 2022-11-11 DIAGNOSIS — J449 Chronic obstructive pulmonary disease, unspecified: Secondary | ICD-10-CM | POA: Diagnosis not present

## 2022-11-11 DIAGNOSIS — I2510 Atherosclerotic heart disease of native coronary artery without angina pectoris: Secondary | ICD-10-CM | POA: Diagnosis not present

## 2022-11-11 DIAGNOSIS — I132 Hypertensive heart and chronic kidney disease with heart failure and with stage 5 chronic kidney disease, or end stage renal disease: Secondary | ICD-10-CM | POA: Insufficient documentation

## 2022-11-11 DIAGNOSIS — I5020 Unspecified systolic (congestive) heart failure: Secondary | ICD-10-CM | POA: Diagnosis not present

## 2022-11-11 DIAGNOSIS — R609 Edema, unspecified: Secondary | ICD-10-CM | POA: Insufficient documentation

## 2022-11-11 DIAGNOSIS — I255 Ischemic cardiomyopathy: Secondary | ICD-10-CM | POA: Insufficient documentation

## 2022-11-11 DIAGNOSIS — Z87891 Personal history of nicotine dependence: Secondary | ICD-10-CM | POA: Insufficient documentation

## 2022-11-11 NOTE — ED Notes (Addendum)
Wounds/skin tears to pts left elbow, left wrist, and right wrist cleaned with NS and 4x4 gauze, and dressed with non-adherend bandages and curlex at this time.

## 2022-11-11 NOTE — Patient Instructions (Signed)
Call us in the future if you need us for anything 

## 2022-11-11 NOTE — Progress Notes (Signed)
PCP: Bari Edward, MD (last seen 04/24) Primary Cardiologist: Melene Muller, MD (last seen 02/24)  HPI:  Ronald [name] is a 77 y/o male with a history of AAA, CAD, DM, hyperlipidemia, HTN, CKD, COPD, GERD, gout, pneumonia, atrial fibrillation, previous tobacco use and chronic heart failure. Will be getting peritoneal dialysis catheter placed tomorrow.  Echo 05/29/22: EF of 35-40% along with normal PA pressure, mild Ronald and mild/moderate TR.   LHC 07/28/17 showed: Ost RCA to Prox RCA lesion is 95% stenosed. Prox RCA lesion is 100% stenosed. Prox Cx lesion is 100% stenosed. Prox LAD lesion is 100% stenosed. Origin lesion is 100% stenosed. Origin lesion is 100% stenosed. Previously placed Dist Graft to Insertion stent (unknown type) is widely patent. Origin to Prox Graft lesion is 10% stenosed. Prox Graft lesion is 30% stenosed. Mid Graft to Dist Graft lesion is 30% stenosed. Mid Graft lesion is 50% stenosed.   Significant native CAD with total occlusion of the LAD after the takeoff of the first diagonal vessel; total occlusion of the proximal left circumflex coronary artery; and total occlusion of the proximal RCA with antegrade bridging collaterals.  Patent LIMA to LAD.  Patent Y vein graft from the 1996 surgery which supplies the diagonal vessel and distal circumflex marginal vessel.  There is diffuse narrowing of 50% in the midportion of the Y graft supplying the diagonal vessel and a patent distal stent extending to the ostium of the diagonal.  The Y graft supplying the distal marginal is free of significant disease and has mild luminal irregularity. Occluded vein graft which had supplied the distal RCA from the 1996 surgery. Patent SVG supplying the distal RCA from the 2007 surgery with a stent in the proximal portion of the graft with 30% narrowings in the proximal and mid segment. Occluded vein graft from 2007 which appears to also have been stented. LVEDP 18 mm  Was in the  ED 11/10/22 due to  a fall after getting off his lawnmower when his leg buckled beneath him and he fell, striking his left head against the porch post. CT scans negative for acute traumatic injury. Was in the ED 11/08/22 due to headache on the right side, hit his head roughly a week ago on the left side.  CT scan was reassuring with no acute intracranial abnormality or skull fracture. Patient had labs which are reassuring. Admitted 07/15/22 due to midsternal chest pain. Heparin drip and medical management. Admitted 05/28/22 due to a/c HF. Diuresed with lasix gtt. Cardiology/ nephrology consults obtained.   He presents today for a HF follow-up visit with a chief complaint of minimal SOB with moderate exertion. Chronic in nature. Has associated leg weakness and multiples bruises on both arms and left side of face. Continues to have swelling in his legs but markedly improved since beginning dialysis on M, W, F.   Is getting his PD catheter removed tomorrow due to continued pain and discomfort with it. Plan to then have fistula in his arm created at some point.   ROS: All systems negative except as listed in HPI, PMH and Problem List.  SH:  Social History   Socioeconomic History   Marital status: Married    Spouse name: Ronald [name]   Number of children: 2   Years of education: Not on file   Highest education level: 8th grade  Occupational History   Occupation: Retired  Tobacco Use   Smoking status: Former    Packs/day: 3.00    Years: 20.00  Additional pack years: 0.00    Total pack years: 60.00    Types: Cigarettes    Quit date: 07/25/1986    Years since quitting: 36.3   Smokeless tobacco: Never  Vaping Use   Vaping Use: Never used  Substance and Sexual Activity   Alcohol use: No    Alcohol/week: 0.0 standard drinks of alcohol   Drug use: No   Sexual activity: Not Currently  Other Topics Concern   Not on file  Social History Narrative   Did auto salvage work.   Lives at home with  his wife.  Independent at baseline.   Social Determinants of Health   Financial Resource Strain: Low Risk  (06/25/2022)   Overall Financial Resource Strain (CARDIA)    Difficulty of Paying Living Expenses: Not hard at all  Food Insecurity: No Food Insecurity (09/17/2022)   Hunger Vital Sign    Worried About Running Out of Food in the Last Year: Never true    Ran Out of Food in the Last Year: Never true  Transportation Needs: No Transportation Needs (09/17/2022)   PRAPARE - Administrator, Civil Service (Medical): No    Lack of Transportation (Non-Medical): No  Physical Activity: Inactive (06/25/2022)   Exercise Vital Sign    Days of Exercise per Week: 0 days    Minutes of Exercise per Session: 0 min  Stress: No Stress Concern Present (06/25/2022)   [name] of Occupational Health - Occupational Stress Questionnaire    Feeling of Stress : Not at all  Social Connections: Moderately Isolated (06/25/2022)   Social Connection and Isolation Panel [NHANES]    Frequency of Communication with Friends and Family: Twice a week    Frequency of Social Gatherings with Friends and Family: Once a week    Attends Religious Services: Never    Database administrator or Organizations: No    Attends Banker Meetings: Never    Marital Status: Married  Catering manager Violence: Not At Risk (09/14/2022)   Humiliation, Afraid, Rape, and Kick questionnaire    Fear of Current or Ex-Partner: No    Emotionally Abused: No    Physically Abused: No    Sexually Abused: No    FH:  Family History  Problem Relation Age of Onset   Heart attack Mother        MI   Stroke Mother    Heart disease Mother    Hypertension Mother    Hyperlipidemia Mother    Asthma Mother    Heart disease Father    Rheumatic fever Father    Colon cancer Neg Hx     Past Medical History:  Diagnosis Date   AAA (abdominal aortic aneurysm) (HCC)    a. 3cm by [place] 2015.   Arthritis    "hips; back"  (12/13/2014)   CAD (coronary artery disease) 2007   a. s/p CABG- IMA-LAD, VG-Cx, VG-RCA, VG-diag in 1999. B. sp redo CABG- VG-OM, VG-RCA in 2007 due to VG disease. c. NSTEMI 11/2014 s/p DES to SVG-OM from the Y graft.d. PTCA/DES x 1 distal body of SVG to Diagonal.09/2015   Chronic combined systolic and diastolic CHF (congestive heart failure) (HCC)    a. remote EF 40-45% in 2006. b. Normal EF 2014. b. Echo 07/2016 EF 45-50%, grade 1 DD. c. Echo 2020 30% to 35%. Diffuse EF 30-35%, diffuse hypokinesis.   Chronic lower back pain    CKD (chronic kidney disease), stage IV (HCC)    COPD (  chronic obstructive pulmonary disease) (HCC)    Deafness in left ear    Degenerative disc disease, lumbar    Diabetes mellitus, type 2 (HCC) 10/04/2014   Microalbumin 05/11/2012-100. Foot exam/monofilament 05/11/2012-normal.   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 noted on CT 2008.   Type II diabetes mellitus (HCC)    Diet control    Unstable angina (HCC) 07/27/2017   Walking pneumonia 1990's   Wears dentures    full upper    Current Outpatient Medications  Medication Sig Dispense Refill   acetaminophen (TYLENOL) 650 MG CR tablet Take 1,300 mg by mouth at bedtime.     allopurinol (ZYLOPRIM) 100 MG tablet TAKE 1 TABLET BY MOUTH EVERYDAY AT BEDTIME 90 tablet 1   amiodarone (PACERONE) 200 MG tablet Take 1 tablet (200 mg total) by mouth daily. Please keep scheduled appointment for future  refills. Thank you. 90 tablet 0   apixaban (ELIQUIS) 5 MG TABS tablet Take 1 tablet (5 mg total) by mouth 2 (two) times daily. 60 tablet 5   atorvastatin (LIPITOR) 40 MG tablet Take 1 tablet (40 mg total) by mouth at bedtime. 90 tablet 1   Cholecalciferol (VITAMIN D-3) 125 MCG (5000 UT) TABS Take 2,000 Units by mouth daily.      diphenhydrAMINE (BENADRYL) 25 mg capsule Take 25 mg by mouth every 6 (six) hours as needed.     fluticasone (FLONASE) 50 MCG/ACT nasal spray Place 1 spray into both nostrils 2 (two) times daily. 16 g 0   isosorbide mononitrate (IMDUR) 60 MG 24 hr tablet Take 1 tablet (60 mg total) by mouth daily. Increased from 30 mg. (Patient taking differently: Take 60 mg by mouth daily.) 30 tablet 2   levothyroxine (SYNTHROID) 75 MCG tablet Take 1 tablet (75 mcg total) by mouth daily before breakfast. 90 tablet 1   metoprolol tartrate (LOPRESSOR) 50 MG tablet Take 1 tablet (50 mg total) by mouth 2 (two) times daily. 60 tablet 2   Multiple Vitamin (MULTIVITAMIN PO) Take 1 tablet by mouth daily.     nitroGLYCERIN (NITROSTAT) 0.4 MG SL tablet Place 0.4 mg under the tongue every 5 (five) minutes as needed for chest pain (Up to 3 times).     pantoprazole (PROTONIX) 40 MG tablet Take 1 tablet (40 mg total) by mouth 2 (two) times daily. Home med.     polyethylene glycol (MIRALAX / GLYCOLAX) 17 g packet Take 17 g by mouth at bedtime.     senna (SENOKOT) 8.6 MG tablet Take 2 tablets by mouth 2 (two) times daily.     sodium bicarbonate 650 MG tablet Take 650 mg by mouth 2 (two) times daily.     No current facility-administered medications for this visit.   Vitals:   11/11/22 1351  BP: 110/63  Pulse: 86  SpO2: 98%  Weight: 199 lb 9.6 oz (90.5 kg)   Wt Readings from Last 3 Encounters:  11/11/22 199  lb 9.6 oz (90.5 kg)  11/08/22 198 lb (89.8 kg)  09/24/22 224 lb (101.6 kg)   Lab Results  Component Value Date   CREATININE 3.03 (H) 11/08/2022   CREATININE 3.12 (H) 09/16/2022    CREATININE 3.78 (H) 09/15/2022   PHYSICAL EXAM:  General:  Well appearing. No resp difficulty HEENT: normal Neck: supple. JVP flat.  No lymphadenopathy or thryomegaly appreciated. Cor: PMI normal. Regular rate & rhythm. No rubs, gallops or murmurs. Lungs: clear Abdomen: soft, nontender, nondistended. No hepatosplenomegaly. No bruits or masses. Good bowel sounds. Extremities: no cyanosis, clubbing, rash, 1+pitting edema bilateral lower legs. Bruising and skin tears on bilateral lower arms; bruising noted on left side of face Neuro: alert & oriented x3, cranial nerves grossly intact. Moves all 4 extremities w/o difficulty. Affect pleasant.   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  - LHC 07/28/17 showed: Ost RCA to Prox RCA lesion is 95% stenosed. Prox RCA lesion is 100% stenosed. Prox Cx lesion is 100% stenosed. Prox LAD lesion is 100% stenosed. Origin lesion is 100% stenosed. Origin lesion is 100% stenosed. Previously placed Dist Graft to Insertion stent (unknown type) is widely patent. Origin to Prox Graft lesion is 10% stenosed. Prox Graft lesion is 30% stenosed. Mid Graft to Dist Graft lesion is 30% stenosed. Mid Graft lesion is 50% stenosed. - not adding salt and reading food labels regarding sodium content - continue metoprolol tartrate 50mg  BID - continue furosemide 40mg  daily PRN - CKD limits other GDMT - BNP 09/13/22 was 1700.3  2: HTN w/ CKD- - BP 110/63 - BMP 11/08/22 showed sodium 140, potassium 4.0, creatinine 3.03 and GFR 20  3: DM- - saw PCP Ronald [name]) 04/24 - A1c 09/06/22 was 5.8%  4: PAF- - saw cardiology  Ronald [name]) 02/24; returns 08/24 - cardioverted 04/2022 - continue amiodarone 200mg  daily - continue apixaban 5mg  BID  5: ESRD-  - saw nephrology Ronald [name]) 02/24 - having PD catheter removed tomorrow  Due to HF stability, will not make a return appointment at this time. Advised patient that he could call back at anytime for questions or to be seen and they were comfortable with this plan.

## 2022-11-12 DIAGNOSIS — I120 Hypertensive chronic kidney disease with stage 5 chronic kidney disease or end stage renal disease: Secondary | ICD-10-CM | POA: Diagnosis not present

## 2022-11-12 DIAGNOSIS — Z7901 Long term (current) use of anticoagulants: Secondary | ICD-10-CM | POA: Diagnosis not present

## 2022-11-12 DIAGNOSIS — Z992 Dependence on renal dialysis: Secondary | ICD-10-CM | POA: Diagnosis not present

## 2022-11-12 DIAGNOSIS — E039 Hypothyroidism, unspecified: Secondary | ICD-10-CM | POA: Diagnosis not present

## 2022-11-12 DIAGNOSIS — Z880 Allergy status to penicillin: Secondary | ICD-10-CM | POA: Diagnosis not present

## 2022-11-12 DIAGNOSIS — Z951 Presence of aortocoronary bypass graft: Secondary | ICD-10-CM | POA: Diagnosis not present

## 2022-11-12 DIAGNOSIS — I509 Heart failure, unspecified: Secondary | ICD-10-CM | POA: Diagnosis not present

## 2022-11-12 DIAGNOSIS — Z885 Allergy status to narcotic agent status: Secondary | ICD-10-CM | POA: Diagnosis not present

## 2022-11-12 DIAGNOSIS — Z4902 Encounter for fitting and adjustment of peritoneal dialysis catheter: Secondary | ICD-10-CM | POA: Diagnosis not present

## 2022-11-12 DIAGNOSIS — T85848D Pain due to other internal prosthetic devices, implants and grafts, subsequent encounter: Secondary | ICD-10-CM | POA: Diagnosis not present

## 2022-11-12 DIAGNOSIS — Z9889 Other specified postprocedural states: Secondary | ICD-10-CM | POA: Diagnosis not present

## 2022-11-12 DIAGNOSIS — K219 Gastro-esophageal reflux disease without esophagitis: Secondary | ICD-10-CM | POA: Diagnosis not present

## 2022-11-12 DIAGNOSIS — Z881 Allergy status to other antibiotic agents status: Secondary | ICD-10-CM | POA: Diagnosis not present

## 2022-11-12 DIAGNOSIS — G4733 Obstructive sleep apnea (adult) (pediatric): Secondary | ICD-10-CM | POA: Diagnosis not present

## 2022-11-12 DIAGNOSIS — N186 End stage renal disease: Secondary | ICD-10-CM | POA: Diagnosis not present

## 2022-11-12 DIAGNOSIS — I2510 Atherosclerotic heart disease of native coronary artery without angina pectoris: Secondary | ICD-10-CM | POA: Diagnosis not present

## 2022-11-12 DIAGNOSIS — Z87891 Personal history of nicotine dependence: Secondary | ICD-10-CM | POA: Diagnosis not present

## 2022-11-12 DIAGNOSIS — Z882 Allergy status to sulfonamides status: Secondary | ICD-10-CM | POA: Diagnosis not present

## 2022-11-12 DIAGNOSIS — G8918 Other acute postprocedural pain: Secondary | ICD-10-CM | POA: Diagnosis not present

## 2022-11-12 DIAGNOSIS — Z7951 Long term (current) use of inhaled steroids: Secondary | ICD-10-CM | POA: Diagnosis not present

## 2022-11-12 DIAGNOSIS — I132 Hypertensive heart and chronic kidney disease with heart failure and with stage 5 chronic kidney disease, or end stage renal disease: Secondary | ICD-10-CM | POA: Diagnosis not present

## 2022-11-12 DIAGNOSIS — Z79899 Other long term (current) drug therapy: Secondary | ICD-10-CM | POA: Diagnosis not present

## 2022-11-13 DIAGNOSIS — N186 End stage renal disease: Secondary | ICD-10-CM | POA: Diagnosis not present

## 2022-11-13 DIAGNOSIS — Z992 Dependence on renal dialysis: Secondary | ICD-10-CM | POA: Diagnosis not present

## 2022-11-15 ENCOUNTER — Ambulatory Visit: Payer: Self-pay

## 2022-11-15 ENCOUNTER — Ambulatory Visit (INDEPENDENT_AMBULATORY_CARE_PROVIDER_SITE_OTHER): Payer: Medicare Other | Admitting: Internal Medicine

## 2022-11-15 ENCOUNTER — Encounter: Payer: Self-pay | Admitting: Internal Medicine

## 2022-11-15 VITALS — BP 118/78 | HR 80 | Ht 69.0 in | Wt 197.0 lb

## 2022-11-15 DIAGNOSIS — Z992 Dependence on renal dialysis: Secondary | ICD-10-CM

## 2022-11-15 DIAGNOSIS — G44209 Tension-type headache, unspecified, not intractable: Secondary | ICD-10-CM | POA: Diagnosis not present

## 2022-11-15 DIAGNOSIS — N186 End stage renal disease: Secondary | ICD-10-CM

## 2022-11-15 DIAGNOSIS — T148XXA Other injury of unspecified body region, initial encounter: Secondary | ICD-10-CM

## 2022-11-15 MED ORDER — DIAZEPAM 2 MG PO TABS
2.0000 mg | ORAL_TABLET | Freq: Every evening | ORAL | 0 refills | Status: DC | PRN
Start: 2022-11-15 — End: 2023-02-22

## 2022-11-15 NOTE — Progress Notes (Signed)
Date:  11/15/2022   Name:  Ronald [name] [facility]   DOB:  [birth year]   MRN:  [id number]   Chief Complaint: Headache (Right side, has fluid behind ear )  Head Injury  The incident occurred 5 to 7 days ago. The injury mechanism was a fall. There was no loss of consciousness. Associated symptoms include headaches.  He suffered an abrasion to the left parietal scalp with a hematoma and skin tears to both arms. Now he had pressure on the right side of his head and was told there was fluid in his right ear.  He has a right sided headache that starts behind the ear and radiates to his right temple and frontal region.  He gets some relief with tylenol.  He has not tried heat or ice.  CT Head and C-spine 11/10/22: IMPRESSION: CT of the head: No acute intracranial abnormality noted. CT of the cervical spine: Degenerative change without acute abnormality. Lab Results  Component Value Date   NA 140 11/08/2022   K 4.0 11/08/2022   CO2 26 11/08/2022   GLUCOSE 106 (H) 11/08/2022   BUN 16 11/08/2022   CREATININE 3.03 (H) 11/08/2022   CALCIUM 8.7 (L) 11/08/2022   EGFR 14 (L) 05/19/2022   GFRNONAA 20 (L) 11/08/2022   Lab Results  Component Value Date   CHOL 109 07/16/2022   HDL 37 (L) 07/16/2022   LDLCALC 59 07/16/2022   TRIG 63 07/16/2022   CHOLHDL 2.9 07/16/2022   Lab Results  Component Value Date   TSH 1.951 05/28/2022   Lab Results  Component Value Date   HGBA1C 5.8 09/06/2022   Lab Results  Component Value Date   WBC 4.8 11/08/2022   HGB 13.4 11/08/2022   HCT 43.6 11/08/2022   MCV 89.7 11/08/2022   PLT 157 11/08/2022   Lab Results  Component Value Date   ALT 26 11/08/2022   AST 47 (H) 11/08/2022   ALKPHOS 207 (H) 11/08/2022   BILITOT 1.5 (H) 11/08/2022   Lab Results  Component Value Date   VD25OH 83 09/06/2022     Review of Systems  Constitutional:  Negative for appetite change, chills, fatigue and fever.  Respiratory:  Negative for chest tightness and shortness of  breath.   Cardiovascular:  Negative for chest pain and leg swelling.  Skin:  Positive for wound (small skin tears on both arms).  Neurological:  Positive for headaches. Negative for dizziness and light-headedness.  Psychiatric/Behavioral:  Positive for sleep disturbance. Negative for dysphoric mood. The patient is not nervous/anxious.     Patient Active Problem List   Diagnosis Date Noted   Muscle tension headache 11/15/2022   Long term (current) use of anticoagulants 11/10/2022   ESRD on dialysis (HCC) 09/16/2022   Physical deconditioning 09/16/2022   Bilateral leg edema 09/13/2022   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) 07/22/2022   Coronary artery disease of native artery of native heart with stable angina pectoris (HCC) 07/22/2022   OSA (obstructive sleep apnea) 07/22/2022   Chronic systolic CHF (congestive heart failure) (HCC) 07/15/2022   Typical atrial flutter (HCC) 07/15/2022   Acute on chronic combined systolic (congestive) and diastolic (congestive) heart failure (HCC) 05/28/2022   Hypothyroidism 05/28/2022   GERD without esophagitis 05/28/2022   Essential hypertension 05/05/2022   Prediabetes 04/06/2022   Spinal stenosis of lumbar region without neurogenic claudication 04/06/2022   Weakness of both lower extremities 04/06/2022   Morbid obesity (HCC) 04/06/2022  Upper airway cough syndrome 10/01/2021   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   Bursitis of hip 12/18/2018   Paroxysmal atrial fibrillation (HCC) 06/10/2018   Atherosclerosis of native arteries of extremity with intermittent claudication (HCC) 06/25/2016   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   Coronary artery disease involving native coronary artery with angina pectoris (HCC)    Allergic rhinitis 08/19/2015   Anemia of renal disease 10/04/2014   AA (aortic aneurysm) (HCC) 10/04/2014   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   Basal cell papilloma 10/04/2014    Unspecified osteoarthritis, unspecified site 10/04/2014   Thoracic aortic aneurysm (TAA) (HCC) 10/04/2014   Atherosclerotic heart disease of native coronary artery without angina pectoris 10/04/2014   Benign prostatic hyperplasia with lower urinary tract symptoms 10/24/2012   DOE (dyspnea on exertion) 11/12/2009   Renal artery stenosis     Mixed hyperlipidemia 09/04/2008   History of redo bypass grafting 09/04/2008    Allergies  Allergen Reactions   Predicort [Prednisolone] Other (See Comments)    Stomach pain   Ciprofloxacin Other (See Comments)    GI upset   Hydrochlorothiazide Other (See Comments)    Dehydration   Hydrocodone Nausea Only and Other (See Comments)    Stomach upset    Hydrocodone-Acetaminophen Nausea Only    Stomach upset   Sulfa Antibiotics Other (See Comments)    Cannot recall   Penicillins Hives, Rash and Other (See Comments)    Has patient had a PCN reaction causing immediate rash, facial/tongue/throat swelling, SOB or lightheadedness with hypotension: YES  Has patient had a PCN reaction causing severe rash involving mucus membranes or skin necrosis: NO  Has patient had a PCN reaction that required hospitalization NO  Has patient had a PCN reaction occurring within the last 10 years:NO  If all of the above answers are "NO", then may proceed with Cephalosporin use.  Has patient had a PCN reaction causing immediate rash, facial/tongue/throat swelling, SOB or lightheadedness with hypotension: YES Has patient had a PCN reaction causing severe rash involving mucus membranes or skin necrosis: NO Has patient had a PCN reaction that required hospitalization NO Has patient had a PCN reaction occurring within the last 10 years:NO If all of the above answers are "NO", then may proceed with Cephalosporin use.    Past Surgical History:  Procedure Laterality Date   CARDIAC CATHETERIZATION  "several"   CARDIAC CATHETERIZATION N/A 12/13/2014   Procedure: Left Heart  Cath and Coronary Angiography;  Surgeon: Corky Crafts, MD;  Location: [facility] INVASIVE CV LAB;  Service: Cardiovascular;  Laterality: N/A;   CARDIAC CATHETERIZATION  12/13/2014   Procedure: Coronary Stent Intervention;  Surgeon: Corky Crafts, MD;  Location: [facility] INVASIVE CV LAB;  Service: Cardiovascular;;   CARDIAC CATHETERIZATION N/A 10/07/2015   Procedure: Left Heart Cath and Cors/Grafts Angiography;  Surgeon: Kathleene Hazel, MD;  Location: [facility] INVASIVE CV LAB;  Service: Cardiovascular;  Laterality: N/A;   CARDIAC CATHETERIZATION N/A 10/07/2015   Procedure: Coronary Stent Intervention;  Surgeon: Kathleene Hazel, MD;  Location: [facility] INVASIVE CV LAB;  Service: Cardiovascular;  Laterality: N/A;   CARDIOVERSION N/A 05/25/2022   Procedure: CARDIOVERSION;  Surgeon: Thomasene Ripple, DO;  Location: [facility] ENDOSCOPY;  Service: Cardiovascular;  Laterality: N/A;   CATARACT EXTRACTION W/PHACO Left 10/21/2021   Procedure: CATARACT EXTRACTION PHACO AND INTRAOCULAR LENS PLACEMENT (IOC) LEFT 3.94 00:33.7;  Surgeon: Nevada Crane, MD;  Location: [facility];  Service: Ophthalmology;  Laterality: Left;   CATARACT EXTRACTION W/PHACO Right 11/09/2021  Procedure: CATARACT EXTRACTION PHACO AND INTRAOCULAR LENS PLACEMENT (IOC) RIGHT;  Surgeon: Nevada Crane, MD;  Location: [facility];  Service: Ophthalmology;  Laterality: Right;  3.59 0:29.4   CORONARY ANGIOPLASTY  "several"   CORONARY ANGIOPLASTY WITH STENT PLACEMENT  2005; 12/13/2014   "2; 1"   CORONARY ARTERY BYPASS GRAFT  05/31/1994   CABG X5   CORONARY ARTERY BYPASS GRAFT  07/29/2005   CABG X3   DIALYSIS/PERMA CATHETER INSERTION N/A 09/10/2022   Procedure: DIALYSIS/PERMA CATHETER INSERTION;  Surgeon: Renford Dills, MD;  Location: [facility] INVASIVE CV LAB;  Service: Cardiovascular;  Laterality: N/A;   ESOPHAGOGASTRODUODENOSCOPY (EGD) WITH ESOPHAGEAL DILATION  05/31/1998   GREEN LIGHT LASER TURP (TRANSURETHRAL RESECTION  OF PROSTATE  01/30/1999   "not cancerous"   HERNIA REPAIR     LAPAROSCOPIC CHOLECYSTECTOMY     LEFT HEART CATH AND CORS/GRAFTS ANGIOGRAPHY N/A 07/28/2017   Procedure: LEFT HEART CATH AND CORS/GRAFTS ANGIOGRAPHY;  Surgeon: Lennette Bihari, MD;  Location: [facility] INVASIVE CV LAB;  Service: Cardiovascular;  Laterality: N/A;   LUNG SURGERY  05/31/1994   "S/P CABG, had to put staple in lung after it had collapsed"   MINOR REMOVAL OF PERITONEAL DIALYSIS CATHETER  10/2022   UMBILICAL HERNIA REPAIR     w/chole    Social History   Tobacco Use   Smoking status: Former    Packs/day: 3.00    Years: 20.00    Additional pack years: 0.00    Total pack years: 60.00    Types: Cigarettes    Quit date: 07/25/1986    Years since quitting: 36.3   Smokeless tobacco: Never  Vaping Use   Vaping Use: Never used  Substance Use Topics   Alcohol use: No    Alcohol/week: 0.0 standard drinks of alcohol   Drug use: No     Medication list has been reviewed and updated.  Current Meds  Medication Sig   acetaminophen (TYLENOL) 650 MG CR tablet Take 1,300 mg by mouth at bedtime.   allopurinol (ZYLOPRIM) 100 MG tablet TAKE 1 TABLET BY MOUTH EVERYDAY AT BEDTIME   amiodarone (PACERONE) 200 MG tablet Take 1 tablet (200 mg total) by mouth daily. Please keep scheduled appointment for future refills. Thank you.   apixaban (ELIQUIS) 5 MG TABS tablet Take 1 tablet (5 mg total) by mouth 2 (two) times daily.   atorvastatin (LIPITOR) 40 MG tablet Take 1 tablet (40 mg total) by mouth at bedtime.   Cholecalciferol (VITAMIN D-3) 125 MCG (5000 UT) TABS Take 2,000 Units by mouth daily.    diazepam (VALIUM) 2 MG tablet Take 1-2 tablets (2-4 mg total) by mouth at bedtime as needed for anxiety.   diphenhydrAMINE (BENADRYL) 50 MG tablet Take 50 mg by mouth every 6 (six) hours as needed for allergies or sleep.   fluticasone (FLONASE) 50 MCG/ACT nasal spray Place 1 spray into both nostrils 2 (two) times daily.   furosemide (LASIX) 40  MG tablet Take 40 mg by mouth daily as needed for edema. Taking 1 1/2 tablets (60mg  total)   lactulose (CHRONULAC) 10 GM/15ML solution Take 10 g by mouth every other day.   levothyroxine (SYNTHROID) 75 MCG tablet Take 1 tablet (75 mcg total) by mouth daily before breakfast.   metoprolol tartrate (LOPRESSOR) 50 MG tablet Take 1 tablet (50 mg total) by mouth 2 (two) times daily.   Multiple Vitamin (MULTIVITAMIN PO) Take 1 tablet by mouth daily.   nitroGLYCERIN (NITROSTAT) 0.4 MG SL tablet Place 0.4 mg  under the tongue every 5 (five) minutes as needed for chest pain (Up to 3 times).   pantoprazole (PROTONIX) 40 MG tablet Take 1 tablet (40 mg total) by mouth 2 (two) times daily. Home med.   polyethylene glycol (MIRALAX / GLYCOLAX) 17 g packet Take 17 g by mouth at bedtime.   senna (SENOKOT) 8.6 MG tablet Take 2 tablets by mouth 2 (two) times daily.   sodium bicarbonate 650 MG tablet Take 650 mg by mouth 2 (two) times daily.       09/24/2022    2:04 PM 07/26/2022    1:48 PM 04/06/2022    3:38 PM  GAD 7 : Generalized Anxiety Score  Nervous, Anxious, on Edge 1 3 2   Control/stop worrying 1 3 2   Worry too much - different things 1 3 3   Trouble relaxing 3 3 2   Restless 2 0 2  Easily annoyed or irritable 0 0 2  Afraid - awful might happen 0 2 2  Total GAD 7 Score 8 14 15   Anxiety Difficulty Somewhat difficult Somewhat difficult Extremely difficult       09/24/2022    2:03 PM 07/26/2022    1:47 PM 06/25/2022    1:37 PM  Depression screen PHQ 2/9  Decreased Interest 0 2 0  Down, Depressed, Hopeless 0 2 1  PHQ - 2 Score 0 4 1  Altered sleeping 3 2   Tired, decreased energy 3 0   Change in appetite 1 2   Feeling bad or failure about yourself  1 2   Trouble concentrating 0 0   Moving slowly or fidgety/restless 3 3   Suicidal thoughts 0 0   PHQ-9 Score 11 13   Difficult doing work/chores Not difficult at all Somewhat difficult     BP Readings from Last 3 Encounters:  11/15/22 118/78   11/11/22 110/63  11/10/22 112/68    Physical Exam Vitals and nursing note reviewed.  Constitutional:      General: He is not in acute distress.    Appearance: He is well-developed.  HENT:     Head: Normocephalic and atraumatic.     Jaw: There is normal jaw occlusion.     Nose:     Right Sinus: No maxillary sinus tenderness or frontal sinus tenderness.     Left Sinus: No maxillary sinus tenderness or frontal sinus tenderness.  Cardiovascular:     Rate and Rhythm: Normal rate and regular rhythm.  Pulmonary:     Effort: Pulmonary effort is normal. No respiratory distress.     Breath sounds: Normal breath sounds. No decreased breath sounds or wheezing.  Musculoskeletal:     Cervical back: Spasms and tenderness present. Pain with movement present. Decreased range of motion.     Comments: Kyphosis of the neck with forward flexion and posterior cervical muscle tenderness and tension  Skin:    General: Skin is warm and dry.     Findings: No rash.  Neurological:     Mental Status: He is alert and oriented to person, place, and time.  Psychiatric:        Mood and Affect: Mood normal.        Behavior: Behavior normal.     Wt Readings from Last 3 Encounters:  11/15/22 197 lb (89.4 kg)  11/11/22 199 lb 9.6 oz (90.5 kg)  11/08/22 198 lb (89.8 kg)    BP 118/78   Pulse 80   Ht 5\' 9"  (1.753 m)   Wt 197 lb (  89.4 kg)   SpO2 98%   BMI 29.09 kg/m   Assessment and Plan:  Problem List Items Addressed This Visit     Muscle tension headache - Primary    Suspect his head pain is muscle tension Recommend Heat or Ice to occipital/parietal region Head and neck support at all times Tylenol at least twice a day Will add low dose Valium 2 mg at bedtime to help with spasm      Relevant Medications   diazepam (VALIUM) 2 MG tablet   ESRD on dialysis [facility])    PD catheter is now removed and he has consult in the near future for AVG placement      Other Visit Diagnoses     Multiple  skin tears       recommend daily local care - cleanse with mild soap and warm water apply vaseline and non stick dressing or bandaid until healed       No follow-ups on file.   Partially dictated using Dragon software, any errors are not intentional.  Reubin Milan, MD [facility] [place], [place]

## 2022-11-15 NOTE — Assessment & Plan Note (Signed)
Suspect his head pain is muscle tension Recommend Heat or Ice to occipital/parietal region Head and neck support at all times Tylenol at least twice a day Will add low dose Valium 2 mg at bedtime to help with spasm

## 2022-11-15 NOTE — Telephone Encounter (Signed)
      Chief Complaint: Headaches, has hit head x 2 in 2 weeks. Seen in ED ."CT Scan normal" per wife. Symptoms: Above Frequency: 2 weeks ago Pertinent Negatives: Patient denies  Disposition: [] ED /[] Urgent Care (no appt availability in office) / [x] Appointment(In office/virtual)/ []  [place] [facility]/ [] Home Care/ [] Refused Recommended Disposition /[]  Mobile Bus/ []  Follow-up with PCP Additional Notes:   Reason for Disposition  [1] MILD-MODERATE headache AND [2] present > 72 hours  Answer Assessment - Initial Assessment Questions 1. LOCATION: "Where does it hurt?"      Front on right above eye 2. ONSET: "When did the headache start?" (Minutes, hours or days)      3 weeks ago 3. PATTERN: "Does the pain come and go, or has it been constant since it started?"     Constant 4. SEVERITY: "How bad is the pain?" and "What does it keep you from doing?"  (e.g., Scale 1-10; mild, moderate, or severe)   - MILD (1-3): doesn't interfere with normal activities    - MODERATE (4-7): interferes with normal activities or awakens from sleep    - SEVERE (8-10): excruciating pain, unable to do any normal activities        Mild-moderate 5. RECURRENT SYMPTOM: "Have you ever had headaches before?" If Yes, ask: "When was the last time?" and "What happened that time?"      No 6. CAUSE: "What do you think is causing the headache?"     Has hit head x 2 7. MIGRAINE: "Have you been diagnosed with migraine headaches?" If Yes, ask: "Is this headache similar?"      No 8. HEAD INJURY: "Has there been any recent injury to the head?"      Yes 9. OTHER SYMPTOMS: "Do you have any other symptoms?" (fever, stiff neck, eye pain, sore throat, cold symptoms)     No 10. PREGNANCY: "Is there any chance you are pregnant?" "When was your last menstrual period?"       N/a  Protocols used: Headache-A-AH

## 2022-11-15 NOTE — Assessment & Plan Note (Signed)
PD catheter is now removed and he has consult in the near future for AVG placement

## 2022-11-17 ENCOUNTER — Telehealth: Payer: Self-pay

## 2022-11-17 DIAGNOSIS — N186 End stage renal disease: Secondary | ICD-10-CM | POA: Diagnosis not present

## 2022-11-17 DIAGNOSIS — Z992 Dependence on renal dialysis: Secondary | ICD-10-CM | POA: Diagnosis not present

## 2022-11-17 NOTE — Telephone Encounter (Signed)
Transition Care Management Follow-up Telephone Call Date of discharge and from where: [facility] 6/13 How have you been since you were released from the hospital? Doing good Any questions or concerns? No  Items Reviewed: Did the pt receive and understand the discharge instructions provided? Yes  Medications obtained and verified? Yes  Other? No  Any new allergies since your discharge? No  Dietary orders reviewed? No Do you have support at home? Yes     Follow up appointments reviewed:  PCP Hospital f/u appt confirmed? Yes  Scheduled to see pcp on 6/17 @ . Specialist Hospital f/u appt confirmed?  Scheduled to see  on  @ . Are transportation arrangements needed? No  If their condition worsens, is the pt aware to call PCP or go to the Emergency Dept.? Yes Was the patient provided with contact information for the PCP's office or ED? Yes Was to pt encouraged to call back with questions or concerns? Yes

## 2022-11-19 DIAGNOSIS — N186 End stage renal disease: Secondary | ICD-10-CM | POA: Diagnosis not present

## 2022-11-19 DIAGNOSIS — Z992 Dependence on renal dialysis: Secondary | ICD-10-CM | POA: Diagnosis not present

## 2022-11-22 DIAGNOSIS — I120 Hypertensive chronic kidney disease with stage 5 chronic kidney disease or end stage renal disease: Secondary | ICD-10-CM | POA: Diagnosis not present

## 2022-11-22 DIAGNOSIS — N186 End stage renal disease: Secondary | ICD-10-CM | POA: Diagnosis not present

## 2022-11-23 DIAGNOSIS — Z992 Dependence on renal dialysis: Secondary | ICD-10-CM | POA: Diagnosis not present

## 2022-11-23 DIAGNOSIS — N186 End stage renal disease: Secondary | ICD-10-CM | POA: Diagnosis not present

## 2022-11-24 DIAGNOSIS — N186 End stage renal disease: Secondary | ICD-10-CM | POA: Diagnosis not present

## 2022-11-24 DIAGNOSIS — Z992 Dependence on renal dialysis: Secondary | ICD-10-CM | POA: Diagnosis not present

## 2022-11-26 DIAGNOSIS — N186 End stage renal disease: Secondary | ICD-10-CM | POA: Diagnosis not present

## 2022-11-26 DIAGNOSIS — Z992 Dependence on renal dialysis: Secondary | ICD-10-CM | POA: Diagnosis not present

## 2022-11-28 DIAGNOSIS — N186 End stage renal disease: Secondary | ICD-10-CM | POA: Diagnosis not present

## 2022-11-28 DIAGNOSIS — Z992 Dependence on renal dialysis: Secondary | ICD-10-CM | POA: Diagnosis not present

## 2022-11-29 DIAGNOSIS — Z992 Dependence on renal dialysis: Secondary | ICD-10-CM | POA: Diagnosis not present

## 2022-11-29 DIAGNOSIS — N186 End stage renal disease: Secondary | ICD-10-CM | POA: Diagnosis not present

## 2022-11-30 DIAGNOSIS — I120 Hypertensive chronic kidney disease with stage 5 chronic kidney disease or end stage renal disease: Secondary | ICD-10-CM | POA: Diagnosis not present

## 2022-12-01 ENCOUNTER — Ambulatory Visit: Payer: Self-pay | Admitting: *Deleted

## 2022-12-01 DIAGNOSIS — N186 End stage renal disease: Secondary | ICD-10-CM | POA: Diagnosis not present

## 2022-12-01 DIAGNOSIS — Z992 Dependence on renal dialysis: Secondary | ICD-10-CM | POA: Diagnosis not present

## 2022-12-01 NOTE — Telephone Encounter (Signed)
Spoke to [facility] told her to take the pt to the ER. Pt stated that he is laying down at the moment. She stated that she took his blood pressure and it was 90 systolic. She stated she will check his BP again and its its still above 90 she will just continue to monitor the pt.  KP

## 2022-12-01 NOTE — Telephone Encounter (Signed)
  Chief Complaint: Hypotension Symptoms: Pt had HD this AM,  BP now 79/48  HR 96 right arm          BP  83/51   HR 102 left arm.  States HD told him BP was low but did not give any values. States dizzy at times sitting to standing. HAs not been hydrating.  Frequency: Prior to call Pertinent Negatives: Patient denies  Disposition: [x] ED /[] Urgent Care (no appt availability in office) / [] Appointment(In office/virtual)/ []  [place] [facility]/ [] Home Care/ [] Refused Recommended Disposition /[] [place] [facility]/ []  Follow-up with PCP Additional Notes: Advised ED, declines. "I want to come in and see if my BP monitor is correct." Advised no availability. Advised to alert HD facility. Advised to hydrate, elevate legs, monitor BP. Advised NT would route to practice for PCPs review. Go to ED for ANY worsening symptoms or further drop in BP. Verbalizes understanding. Reason for Disposition  [1] Fall in systolic BP > 20 mm Hg from normal AND [2] dizzy, lightheaded, or weak  Answer Assessment - Initial Assessment Questions 1. BLOOD PRESSURE: "What is the blood pressure?" "Did you take at least two measurements 5 minutes apart?"     79/48 HR 96    83/51   HR102 2. ONSET: "When did you take your blood pressure?"     Minutes ago 3. HOW: "How did you obtain the blood pressure?" (e.g., visiting nurse, automatic home BP monitor)     Home monitor 4. HISTORY: "Do you have a history of low blood pressure?" "What is your blood pressure normally?"     No  on HD 5. MEDICINES: "Are you taking any medications for blood pressure?" If Yes, ask: "Have they been changed recently?"     No  On HD 6. PULSE RATE: "Do you know what your pulse rate is?"      96-102 7. OTHER SYMPTOMS: "Have you been sick recently?" "Have you had a recent injury?"     No  Protocols used: Blood Pressure - Low-A-AH

## 2022-12-03 DIAGNOSIS — N186 End stage renal disease: Secondary | ICD-10-CM | POA: Diagnosis not present

## 2022-12-03 DIAGNOSIS — Z992 Dependence on renal dialysis: Secondary | ICD-10-CM | POA: Diagnosis not present

## 2022-12-06 DIAGNOSIS — Z992 Dependence on renal dialysis: Secondary | ICD-10-CM | POA: Diagnosis not present

## 2022-12-06 DIAGNOSIS — N186 End stage renal disease: Secondary | ICD-10-CM | POA: Diagnosis not present

## 2022-12-08 DIAGNOSIS — N186 End stage renal disease: Secondary | ICD-10-CM | POA: Diagnosis not present

## 2022-12-08 DIAGNOSIS — Z992 Dependence on renal dialysis: Secondary | ICD-10-CM | POA: Diagnosis not present

## 2022-12-10 DIAGNOSIS — I4891 Unspecified atrial fibrillation: Secondary | ICD-10-CM | POA: Diagnosis not present

## 2022-12-10 DIAGNOSIS — Z992 Dependence on renal dialysis: Secondary | ICD-10-CM | POA: Diagnosis not present

## 2022-12-10 DIAGNOSIS — Z87891 Personal history of nicotine dependence: Secondary | ICD-10-CM | POA: Diagnosis not present

## 2022-12-10 DIAGNOSIS — I509 Heart failure, unspecified: Secondary | ICD-10-CM | POA: Diagnosis not present

## 2022-12-10 DIAGNOSIS — I2510 Atherosclerotic heart disease of native coronary artery without angina pectoris: Secondary | ICD-10-CM | POA: Diagnosis not present

## 2022-12-10 DIAGNOSIS — N186 End stage renal disease: Secondary | ICD-10-CM | POA: Diagnosis not present

## 2022-12-10 DIAGNOSIS — I132 Hypertensive heart and chronic kidney disease with heart failure and with stage 5 chronic kidney disease, or end stage renal disease: Secondary | ICD-10-CM | POA: Diagnosis not present

## 2022-12-10 DIAGNOSIS — E039 Hypothyroidism, unspecified: Secondary | ICD-10-CM | POA: Diagnosis not present

## 2022-12-10 DIAGNOSIS — Z881 Allergy status to other antibiotic agents status: Secondary | ICD-10-CM | POA: Diagnosis not present

## 2022-12-10 DIAGNOSIS — G4730 Sleep apnea, unspecified: Secondary | ICD-10-CM | POA: Diagnosis not present

## 2022-12-10 DIAGNOSIS — D631 Anemia in chronic kidney disease: Secondary | ICD-10-CM | POA: Diagnosis not present

## 2022-12-10 DIAGNOSIS — E785 Hyperlipidemia, unspecified: Secondary | ICD-10-CM | POA: Diagnosis not present

## 2022-12-10 DIAGNOSIS — Z955 Presence of coronary angioplasty implant and graft: Secondary | ICD-10-CM | POA: Diagnosis not present

## 2022-12-10 DIAGNOSIS — Z79899 Other long term (current) drug therapy: Secondary | ICD-10-CM | POA: Diagnosis not present

## 2022-12-10 DIAGNOSIS — K219 Gastro-esophageal reflux disease without esophagitis: Secondary | ICD-10-CM | POA: Diagnosis not present

## 2022-12-10 DIAGNOSIS — Z882 Allergy status to sulfonamides status: Secondary | ICD-10-CM | POA: Diagnosis not present

## 2022-12-10 DIAGNOSIS — Z951 Presence of aortocoronary bypass graft: Secondary | ICD-10-CM | POA: Diagnosis not present

## 2022-12-10 DIAGNOSIS — Z7901 Long term (current) use of anticoagulants: Secondary | ICD-10-CM | POA: Diagnosis not present

## 2022-12-10 DIAGNOSIS — I120 Hypertensive chronic kidney disease with stage 5 chronic kidney disease or end stage renal disease: Secondary | ICD-10-CM | POA: Diagnosis not present

## 2022-12-10 DIAGNOSIS — Z880 Allergy status to penicillin: Secondary | ICD-10-CM | POA: Diagnosis not present

## 2022-12-10 DIAGNOSIS — G4733 Obstructive sleep apnea (adult) (pediatric): Secondary | ICD-10-CM | POA: Diagnosis not present

## 2022-12-10 DIAGNOSIS — J449 Chronic obstructive pulmonary disease, unspecified: Secondary | ICD-10-CM | POA: Diagnosis not present

## 2022-12-11 DIAGNOSIS — Z992 Dependence on renal dialysis: Secondary | ICD-10-CM | POA: Diagnosis not present

## 2022-12-11 DIAGNOSIS — N186 End stage renal disease: Secondary | ICD-10-CM | POA: Diagnosis not present

## 2022-12-12 ENCOUNTER — Encounter: Payer: Self-pay | Admitting: Cardiovascular Disease

## 2022-12-13 ENCOUNTER — Other Ambulatory Visit: Payer: Self-pay

## 2022-12-13 DIAGNOSIS — Z992 Dependence on renal dialysis: Secondary | ICD-10-CM | POA: Diagnosis not present

## 2022-12-13 DIAGNOSIS — N186 End stage renal disease: Secondary | ICD-10-CM | POA: Diagnosis not present

## 2022-12-13 MED ORDER — METOPROLOL TARTRATE 50 MG PO TABS: 50.0000 mg | ORAL_TABLET | Freq: Two times a day (BID) | ORAL | 5 refills | Status: DC

## 2022-12-13 NOTE — Telephone Encounter (Signed)
Received mychart message from patient requesting a refill on metoprolol.   No pharmacy listed in request. Contacted the patient to clarify pharmacy; spoke with wife who states the patient uses [facility] in [place].   Rx(s) sent to pharmacy electronically.  Wife thanked me for calling and voiced understanding.

## 2022-12-15 DIAGNOSIS — N186 End stage renal disease: Secondary | ICD-10-CM | POA: Diagnosis not present

## 2022-12-15 DIAGNOSIS — Z992 Dependence on renal dialysis: Secondary | ICD-10-CM | POA: Diagnosis not present

## 2022-12-17 DIAGNOSIS — N186 End stage renal disease: Secondary | ICD-10-CM | POA: Diagnosis not present

## 2022-12-17 DIAGNOSIS — Z992 Dependence on renal dialysis: Secondary | ICD-10-CM | POA: Diagnosis not present

## 2022-12-20 DIAGNOSIS — N186 End stage renal disease: Secondary | ICD-10-CM | POA: Diagnosis not present

## 2022-12-20 DIAGNOSIS — Z992 Dependence on renal dialysis: Secondary | ICD-10-CM | POA: Diagnosis not present

## 2022-12-22 DIAGNOSIS — N186 End stage renal disease: Secondary | ICD-10-CM | POA: Diagnosis not present

## 2022-12-22 DIAGNOSIS — Z992 Dependence on renal dialysis: Secondary | ICD-10-CM | POA: Diagnosis not present

## 2022-12-24 DIAGNOSIS — N186 End stage renal disease: Secondary | ICD-10-CM | POA: Diagnosis not present

## 2022-12-24 DIAGNOSIS — Z992 Dependence on renal dialysis: Secondary | ICD-10-CM | POA: Diagnosis not present

## 2022-12-27 DIAGNOSIS — N186 End stage renal disease: Secondary | ICD-10-CM | POA: Diagnosis not present

## 2022-12-27 DIAGNOSIS — Z992 Dependence on renal dialysis: Secondary | ICD-10-CM | POA: Diagnosis not present

## 2022-12-29 DIAGNOSIS — N186 End stage renal disease: Secondary | ICD-10-CM | POA: Diagnosis not present

## 2022-12-29 DIAGNOSIS — Z992 Dependence on renal dialysis: Secondary | ICD-10-CM | POA: Diagnosis not present

## 2022-12-31 DIAGNOSIS — N186 End stage renal disease: Secondary | ICD-10-CM | POA: Diagnosis not present

## 2022-12-31 DIAGNOSIS — Z992 Dependence on renal dialysis: Secondary | ICD-10-CM | POA: Diagnosis not present

## 2023-01-01 ENCOUNTER — Other Ambulatory Visit: Payer: Self-pay | Admitting: Internal Medicine

## 2023-01-01 DIAGNOSIS — I2510 Atherosclerotic heart disease of native coronary artery without angina pectoris: Secondary | ICD-10-CM

## 2023-01-03 DIAGNOSIS — N186 End stage renal disease: Secondary | ICD-10-CM | POA: Diagnosis not present

## 2023-01-03 DIAGNOSIS — Z992 Dependence on renal dialysis: Secondary | ICD-10-CM | POA: Diagnosis not present

## 2023-01-05 DIAGNOSIS — N186 End stage renal disease: Secondary | ICD-10-CM | POA: Diagnosis not present

## 2023-01-05 DIAGNOSIS — Z992 Dependence on renal dialysis: Secondary | ICD-10-CM | POA: Diagnosis not present

## 2023-01-07 DIAGNOSIS — N186 End stage renal disease: Secondary | ICD-10-CM | POA: Diagnosis not present

## 2023-01-07 DIAGNOSIS — Z992 Dependence on renal dialysis: Secondary | ICD-10-CM | POA: Diagnosis not present

## 2023-01-08 ENCOUNTER — Other Ambulatory Visit: Payer: Self-pay | Admitting: Cardiovascular Disease

## 2023-01-10 DIAGNOSIS — N186 End stage renal disease: Secondary | ICD-10-CM | POA: Diagnosis not present

## 2023-01-10 DIAGNOSIS — Z992 Dependence on renal dialysis: Secondary | ICD-10-CM | POA: Diagnosis not present

## 2023-01-10 NOTE — Telephone Encounter (Signed)
This is a [facility] pt 

## 2023-01-11 NOTE — Telephone Encounter (Signed)
Primary Cardiologist:  Verne Carrow, MD with next appt on 01/28/23 at [facility] location.

## 2023-01-12 DIAGNOSIS — Z992 Dependence on renal dialysis: Secondary | ICD-10-CM | POA: Diagnosis not present

## 2023-01-12 DIAGNOSIS — N186 End stage renal disease: Secondary | ICD-10-CM | POA: Diagnosis not present

## 2023-01-14 DIAGNOSIS — Z992 Dependence on renal dialysis: Secondary | ICD-10-CM | POA: Diagnosis not present

## 2023-01-14 DIAGNOSIS — N186 End stage renal disease: Secondary | ICD-10-CM | POA: Diagnosis not present

## 2023-01-17 DIAGNOSIS — Z992 Dependence on renal dialysis: Secondary | ICD-10-CM | POA: Diagnosis not present

## 2023-01-17 DIAGNOSIS — N186 End stage renal disease: Secondary | ICD-10-CM | POA: Diagnosis not present

## 2023-01-19 DIAGNOSIS — Z992 Dependence on renal dialysis: Secondary | ICD-10-CM | POA: Diagnosis not present

## 2023-01-19 DIAGNOSIS — N186 End stage renal disease: Secondary | ICD-10-CM | POA: Diagnosis not present

## 2023-01-21 DIAGNOSIS — Z992 Dependence on renal dialysis: Secondary | ICD-10-CM | POA: Diagnosis not present

## 2023-01-21 DIAGNOSIS — N186 End stage renal disease: Secondary | ICD-10-CM | POA: Diagnosis not present

## 2023-01-24 DIAGNOSIS — Z992 Dependence on renal dialysis: Secondary | ICD-10-CM | POA: Diagnosis not present

## 2023-01-24 DIAGNOSIS — N186 End stage renal disease: Secondary | ICD-10-CM | POA: Diagnosis not present

## 2023-01-26 DIAGNOSIS — Z992 Dependence on renal dialysis: Secondary | ICD-10-CM | POA: Diagnosis not present

## 2023-01-26 DIAGNOSIS — N186 End stage renal disease: Secondary | ICD-10-CM | POA: Diagnosis not present

## 2023-01-28 ENCOUNTER — Other Ambulatory Visit
Admission: RE | Admit: 2023-01-28 | Discharge: 2023-01-28 | Disposition: A | Discharge status: Home / Self Care | Payer: Medicare Other | Source: Ambulatory Visit | Attending: Internal Medicine | Admitting: Internal Medicine

## 2023-01-28 ENCOUNTER — Ambulatory Visit: Payer: Medicare Other | Admitting: Physician Assistant

## 2023-01-28 ENCOUNTER — Ambulatory Visit: Payer: Medicare Other | Admitting: Cardiovascular Disease

## 2023-01-28 DIAGNOSIS — Z992 Dependence on renal dialysis: Secondary | ICD-10-CM | POA: Diagnosis not present

## 2023-01-28 DIAGNOSIS — C801 Malignant (primary) neoplasm, unspecified: Secondary | ICD-10-CM | POA: Diagnosis not present

## 2023-01-28 DIAGNOSIS — N186 End stage renal disease: Secondary | ICD-10-CM | POA: Diagnosis not present

## 2023-01-28 LAB — HEPATITIS B CORE ANTIBODY, TOTAL: Hep B Core Total Ab: NONREACTIVE

## 2023-01-28 LAB — HEPATITIS B SURFACE ANTIGEN: Hepatitis B Surface Ag: NONREACTIVE

## 2023-01-28 LAB — HEPATITIS B CORE ANTIBODY, IGM: Hep B C IgM: NONREACTIVE

## 2023-01-29 DIAGNOSIS — Z992 Dependence on renal dialysis: Secondary | ICD-10-CM | POA: Diagnosis not present

## 2023-01-29 DIAGNOSIS — N186 End stage renal disease: Secondary | ICD-10-CM | POA: Diagnosis not present

## 2023-01-31 DIAGNOSIS — N186 End stage renal disease: Secondary | ICD-10-CM | POA: Diagnosis not present

## 2023-02-01 DIAGNOSIS — N186 End stage renal disease: Secondary | ICD-10-CM | POA: Diagnosis not present

## 2023-02-02 DIAGNOSIS — N186 End stage renal disease: Secondary | ICD-10-CM | POA: Diagnosis not present

## 2023-02-05 DIAGNOSIS — Z992 Dependence on renal dialysis: Secondary | ICD-10-CM | POA: Diagnosis not present

## 2023-02-05 DIAGNOSIS — N186 End stage renal disease: Secondary | ICD-10-CM | POA: Diagnosis not present

## 2023-02-07 ENCOUNTER — Encounter: Payer: Self-pay | Admitting: Internal Medicine

## 2023-02-07 DIAGNOSIS — Z992 Dependence on renal dialysis: Secondary | ICD-10-CM | POA: Diagnosis not present

## 2023-02-07 DIAGNOSIS — N186 End stage renal disease: Secondary | ICD-10-CM | POA: Diagnosis not present

## 2023-02-08 DIAGNOSIS — Z961 Presence of intraocular lens: Secondary | ICD-10-CM | POA: Diagnosis not present

## 2023-02-08 DIAGNOSIS — H353132 Nonexudative age-related macular degeneration, bilateral, intermediate dry stage: Secondary | ICD-10-CM | POA: Diagnosis not present

## 2023-02-08 DIAGNOSIS — H26493 Other secondary cataract, bilateral: Secondary | ICD-10-CM | POA: Diagnosis not present

## 2023-02-08 DIAGNOSIS — E119 Type 2 diabetes mellitus without complications: Secondary | ICD-10-CM | POA: Diagnosis not present

## 2023-02-08 LAB — HM DIABETES EYE EXAM

## 2023-02-09 DIAGNOSIS — Z992 Dependence on renal dialysis: Secondary | ICD-10-CM | POA: Diagnosis not present

## 2023-02-09 DIAGNOSIS — N186 End stage renal disease: Secondary | ICD-10-CM | POA: Diagnosis not present

## 2023-02-10 ENCOUNTER — Encounter: Payer: Self-pay | Admitting: Internal Medicine

## 2023-02-11 DIAGNOSIS — Z992 Dependence on renal dialysis: Secondary | ICD-10-CM | POA: Diagnosis not present

## 2023-02-11 DIAGNOSIS — N186 End stage renal disease: Secondary | ICD-10-CM | POA: Diagnosis not present

## 2023-02-14 DIAGNOSIS — N186 End stage renal disease: Secondary | ICD-10-CM | POA: Diagnosis not present

## 2023-02-14 DIAGNOSIS — Z992 Dependence on renal dialysis: Secondary | ICD-10-CM | POA: Diagnosis not present

## 2023-02-16 DIAGNOSIS — Z992 Dependence on renal dialysis: Secondary | ICD-10-CM | POA: Diagnosis not present

## 2023-02-16 DIAGNOSIS — N186 End stage renal disease: Secondary | ICD-10-CM | POA: Diagnosis not present

## 2023-02-18 DIAGNOSIS — Z992 Dependence on renal dialysis: Secondary | ICD-10-CM | POA: Diagnosis not present

## 2023-02-18 DIAGNOSIS — N186 End stage renal disease: Secondary | ICD-10-CM | POA: Diagnosis not present

## 2023-02-21 DIAGNOSIS — N186 End stage renal disease: Secondary | ICD-10-CM | POA: Diagnosis not present

## 2023-02-21 DIAGNOSIS — Z992 Dependence on renal dialysis: Secondary | ICD-10-CM | POA: Diagnosis not present

## 2023-02-22 ENCOUNTER — Encounter: Payer: Self-pay | Admitting: Physician Assistant

## 2023-02-22 ENCOUNTER — Ambulatory Visit: Payer: Medicare Other | Attending: Cardiovascular Disease | Admitting: Physician Assistant

## 2023-02-22 VITALS — BP 102/54 | HR 97 | Ht 69.0 in | Wt 198.4 lb

## 2023-02-22 DIAGNOSIS — E1122 Type 2 diabetes mellitus with diabetic chronic kidney disease: Secondary | ICD-10-CM

## 2023-02-22 DIAGNOSIS — I5020 Unspecified systolic (congestive) heart failure: Secondary | ICD-10-CM | POA: Diagnosis not present

## 2023-02-22 DIAGNOSIS — N186 End stage renal disease: Secondary | ICD-10-CM

## 2023-02-22 DIAGNOSIS — I10 Essential (primary) hypertension: Secondary | ICD-10-CM

## 2023-02-22 DIAGNOSIS — I480 Paroxysmal atrial fibrillation: Secondary | ICD-10-CM

## 2023-02-22 MED ORDER — ISOSORBIDE MONONITRATE ER 30 MG PO TB24: 30.0000 mg | ORAL_TABLET | Freq: Every day | ORAL | 11 refills | Status: DC

## 2023-02-22 NOTE — Progress Notes (Signed)
Cardiology Office Note:  .   Date:  02/22/2023  ID:  Ronald [name], DOB Sep 03, 1945, MRN [id number] PCP: Reubin Milan, MD  [facility] Providers Cardiologist:  Verne Carrow, MD {  History of Present Illness: .   Ronald [name] is a 77 y.o. male with a past medical history of AAA, CAD, DM, hyperlipidemia, HTN, CKD, COPD, GERD, gout, pneumonia, atrial fibrillation, previous tobacco use and chronic heart failure here for follow-up appointment.  History includes echocardiogram 05/29/2022 with LVEF 35 to 40% along with normal PA pressures, mild MR and mild to moderate TR.  Left heart catheterization 07/28/2017 showed significant native CAD with total occlusion of LAD after the takeoff of first diagonal vessel with total occlusion of proximal left circumflex coronary artery and total occlusion of proximal RCA with antegrade bridging collaterals.  Patent LIMA to LAD.  Patent Y graft from 9086 surgery which supplies diagonal and distal circumflex marginal vessels.  There was diffuse narrowing of 50% in the midportion of the Y graft supply to the diagonal vessel and patent distal stent extending to the ostium of the diagonal.  Y graft supplied distal margin was free of disease.  Occluded vein graft to the distal RCA.  Patent SVG supplying the distal RCA from 2007 surgery with a stent in the proximal portion of the graft with 30% narrowing in the proximal to mid segment.  Occluded vein graft from 2007 which also appears to have been stented.  LVEDP 18 mm.  Was in the ED 11/10/2022 after a fall getting off his lawnmower.  CT scan negative for acute traumatic injury.  Was in the ED at 11/08/2022 due to headache on the right side.  Hit his head roughly a week prior on the left side.  CT scan reassuring with no acute abnormalities or skull fracture.  Patient had labs which were reassuring.  Admitted 07/15/2022 due to midsternal chest pain.  Heparin drip and medical management.  Admitted 05/28/2022  due to history of heart failure.  Diuresis with Lasix gtt.  Cardiology and nephrology consults obtained.  Was recently seen by CHF clinic back in June with a chief complaint of minimal shortness of breath with moderate exertion.  Chronic in nature.  Associated leg weakness and multiple bruises on arms and left side of face.  Continues to have leg swelling but markedly improved since starting dialysis M, W, F.  Getting PD catheter removed hide at some point plan for fistula in his arm.  Today, he tells me the last time he had chest pain was back in the winter. He does have swelling in his legs at times but resolves the next day. They are able to give him something at dialysis to help. His swelling goes away by the morning. He remains on Eliquis for Afib. No issues recently.  He did however have a fall back in June where he hit his head.  Luckily CT was negative.  He did not have any palpitations with his heart at that time but said his legs give way and he fell backwards.  He has a lot of issues with his back but he is not a candidate for back surgery.  Unfortunately, not much to do right now to help the back.  Blood pressure low normal today and we talked about reducing his isosorbide.  Heart rate always is in the 90s per his wife  Reports no shortness of breath nor dyspnea on exertion.  No orthopnea, PND. Reports no palpitations.  ROS: Pertinent ROS in HPI  Studies Reviewed: .        - LHC 07-31-17 showed: Ost RCA to Prox RCA lesion is 95% stenosed. Prox RCA lesion is 100% stenosed. Prox Cx lesion is 100% stenosed. Prox LAD lesion is 100% stenosed. Origin lesion is 100% stenosed. Origin lesion is 100% stenosed. Previously placed Dist Graft to Insertion stent (unknown type) is widely patent. Origin to Prox Graft lesion is 10% stenosed. Prox Graft lesion is 30% stenosed. Mid Graft to Dist Graft lesion is 30% stenosed. Mid Graft lesion is 50% stenosed.  Risk Assessment/Calculations:     CHA2DS2-VASc Score = 6   This indicates a 9.7% annual risk of stroke. The patient's score is based upon: CHF History: 1 HTN History: 1 Diabetes History: 1 Stroke History: 0 Vascular Disease History: 1 Age Score: 2 Gender Score: 0            Physical Exam:   VS:  BP (!) 102/54   Pulse 97   Ht 5\' 9"  (1.753 m)   Wt 198 lb 6.4 oz (90 kg)   SpO2 98%   BMI 29.30 kg/m    Wt Readings from Last 3 Encounters:  02/22/23 198 lb 6.4 oz (90 kg)  11/15/22 197 lb (89.4 kg)  11/11/22 199 lb 9.6 oz (90.5 kg)    GEN: Well nourished, well developed in no acute distress NECK: No JVD; No carotid bruits CARDIAC: Regularly irregular no murmurs, rubs, gallops RESPIRATORY:  Clear to auscultation without rales, wheezing or rhonchi  ABDOMEN: Soft, non-tender, non-distended EXTREMITIES:  No edema; No deformity   ASSESSMENT AND PLAN: .   1.  HFrEF -35-40% on last echocardiogram December 2023 -his weight has been stable, 192 at home the other day -Plan to continue current medications including amiodarone 200 mg daily, Eliquis 5 mg twice a day, Lipitor 40 mg daily, Lasix 460 mg on nondialysis days, metoprolol 50 mg twice daily, nitro as needed, and isosorbide mononitrate 60 mg (recently reduced today to 30 mg daily)  2.  HTN with CKD -Blood pressure low normal today -We have reduced his isosorbide to 30 mg -Blood pressures have been as low as 70 systolic at dialysis  3.  DM -A1C 5.8 -he does not check his glucose -Currently not taking any medication for his diabetes  4.  PAF -been well controlled -Remains on Eliquis without any significant bleeding -Also remains on amiodarone 200 mg daily as well as metoprolol 50 mg twice daily  5.  ESRD -euvolemic on exam today -Weight has been stable -He has been getting hypotensive at dialysis so we have decreased his isosorbide      Dispo: Please follow-up in 2 to 3 months with APP in 6 months Dr. Clifton James  Signed, Sharlene Dory, PA-C

## 2023-02-22 NOTE — Patient Instructions (Signed)
Medication Instructions:  Decrease Imdur to 30 mg daily. *If you need a refill on your cardiac medications before your next appointment, please call your pharmacy*  Lab Work: None ordered If you have labs (blood work) drawn today and your tests are completely normal, you will receive your results only by: MyChart Message (if you have MyChart) OR A paper copy in the mail If you have any lab test that is abnormal or we need to change your treatment, we will call you to review the results.  Testing/Procedures: Your physician has requested that you have an echocardiogram. Echocardiography is a painless test that uses sound waves to create images of your heart. It provides your doctor with information about the size and shape of your heart and how well your heart's chambers and valves are working. This procedure takes approximately one hour. There are no restrictions for this procedure. Please do NOT wear cologne, perfume, aftershave, or lotions (deodorant is allowed). Please arrive 15 minutes prior to your appointment time.   Follow-Up: At [facility], you and your health needs are our priority.  As part of our continuing mission to provide you with exceptional heart care, we have created designated Provider Care Teams.  These Care Teams include your primary Cardiologist (physician) and Advanced Practice Providers (APPs -  Physician Assistants and Nurse Practitioners) who all work together to provide you with the care you need, when you need it.  Your next appointment:   2-3 month(s)  Provider:   With an APP, then, Verne Carrow, MD will plan to see you again in 6 month(s).   Other Instructions Low-Sodium Eating Plan Salt (sodium) helps you keep a healthy balance of fluids in your body. Too much sodium can raise your blood pressure. It can also cause fluid and waste to be held in your body. Your health care provider or dietitian may recommend a low-sodium eating plan if you  have high blood pressure (hypertension), kidney disease, liver disease, or heart failure. Eating less sodium can help lower your blood pressure and reduce swelling. It can also protect your heart, liver, and kidneys. What are tips for following this plan? Reading food labels  Check food labels for the amount of sodium per serving. If you eat more than one serving, you must multiply the listed amount by the number of servings. Choose foods with less than 140 milligrams (mg) of sodium per serving. Avoid foods with 300 mg of sodium or more per serving. Always check how much sodium is in a product, even if the label says "unsalted" or "no salt added." Shopping  Buy products labeled as "low-sodium" or "no salt added." Buy fresh foods. Avoid canned foods and pre-made or frozen meals. Avoid canned, cured, or processed meats. Buy breads that have less than 80 mg of sodium per slice. Cooking  Eat more home-cooked food. Try to eat less restaurant, buffet, and fast food. Try not to add salt when you cook. Use salt-free seasonings or herbs instead of table salt or sea salt. Check with your provider or pharmacist before using salt substitutes. Cook with plant-based oils, such as canola, sunflower, or olive oil. Meal planning When eating at a restaurant, ask if your food can be made with less salt or no salt. Avoid dishes labeled as brined, pickled, cured, or smoked. Avoid dishes made with soy sauce, miso, or teriyaki sauce. Avoid foods that have monosodium glutamate (MSG) in them. MSG may be added to some restaurant food, sauces, soups, bouillon, and  canned foods. Make meals that can be grilled, baked, poached, roasted, or steamed. These are often made with less sodium. General information Try to limit your sodium intake to 1,500-2,300 mg each day, or the amount told by your provider. What foods should I eat? Fruits Fresh, frozen, or canned fruit. Fruit juice. Vegetables Fresh or frozen vegetables.  "No salt added" canned vegetables. "No salt added" tomato sauce and paste. Low-sodium or reduced-sodium tomato and vegetable juice. Grains Low-sodium cereals, such as oats, puffed wheat and rice, and shredded wheat. Low-sodium crackers. Unsalted rice. Unsalted pasta. Low-sodium bread. Whole grain breads and whole grain pasta. Meats and other proteins Fresh or frozen meat, poultry, seafood, and fish. These should have no added salt. Low-sodium canned tuna and salmon. Unsalted nuts. Dried peas, beans, and lentils without added salt. Unsalted canned beans. Eggs. Unsalted nut butters. Dairy Milk. Soy milk. Cheese that is naturally low in sodium, such as ricotta cheese, fresh mozzarella, or Swiss cheese. Low-sodium or reduced-sodium cheese. Cream cheese. Yogurt. Seasonings and condiments Fresh and dried herbs and spices. Salt-free seasonings. Low-sodium mustard and ketchup. Sodium-free salad dressing. Sodium-free light mayonnaise. Fresh or refrigerated horseradish. Lemon juice. Vinegar. Other foods Homemade, reduced-sodium, or low-sodium soups. Unsalted popcorn and pretzels. Low-salt or salt-free chips. The items listed above may not be all the foods and drinks you can have. Talk to a dietitian to learn more. What foods should I avoid? Vegetables Sauerkraut, pickled vegetables, and relishes. Olives. [place] fries. Onion rings. Regular canned vegetables, except low-sodium or reduced-sodium items. Regular canned tomato sauce and paste. Regular tomato and vegetable juice. Frozen vegetables in sauces. Grains Instant hot cereals. Bread stuffing, pancake, and biscuit mixes. Croutons. Seasoned rice or pasta mixes. Noodle soup cups. Boxed or frozen macaroni and cheese. Regular salted crackers. Self-rising flour. Meats and other proteins Meat or fish that is salted, canned, smoked, spiced, or pickled. Precooked or cured meat, such as sausages or meat loaves. Tomasa Blase. Ham. Pepperoni. Hot dogs. Corned beef. Chipped  beef. Salt pork. Jerky. Pickled herring, anchovies, and sardines. Regular canned tuna. Salted nuts. Dairy Processed cheese and cheese spreads. Hard cheeses. Cheese curds. Blue cheese. Feta cheese. String cheese. Regular cottage cheese. Buttermilk. Canned milk. Fats and oils Salted butter. Regular margarine. Ghee. Bacon fat. Seasonings and condiments Onion salt, garlic salt, seasoned salt, table salt, and sea salt. Canned and packaged gravies. Worcestershire sauce. Tartar sauce. Barbecue sauce. Teriyaki sauce. Soy sauce, including reduced-sodium soy sauce. Steak sauce. Fish sauce. Oyster sauce. Cocktail sauce. Horseradish that you find on the shelf. Regular ketchup and mustard. Meat flavorings and tenderizers. Bouillon cubes. Hot sauce. Pre-made or packaged marinades. Pre-made or packaged taco seasonings. Relishes. Regular salad dressings. Salsa. Other foods Salted popcorn and pretzels. Corn chips and puffs. Potato and tortilla chips. Canned or dried soups. Pizza. Frozen entrees and pot pies. The items listed above may not be all the foods and drinks you should avoid. Talk to a dietitian to learn more. This information is not intended to replace advice given to you by your health care provider. Make sure you discuss any questions you have with your health care provider. Document Revised: 06/03/2022 Document Reviewed: 06/03/2022 Elsevier Patient Education  2024 Elsevier Inc. Heart-Healthy Eating Plan Many factors influence your heart health, including eating and exercise habits. Heart health is also called coronary health. Coronary risk increases with abnormal blood fat (lipid) levels. A heart-healthy eating plan includes limiting unhealthy fats, increasing healthy fats, limiting salt (sodium) intake, and making other diet and lifestyle changes.  What is my plan? Your health care provider may recommend that: You limit your fat intake to _________% or less of your total calories each day. You limit  your saturated fat intake to _________% or less of your total calories each day. You limit the amount of cholesterol in your diet to less than _________ mg per day. You limit the amount of sodium in your diet to less than _________ mg per day. What are tips for following this plan? Cooking Cook foods using methods other than frying. Baking, boiling, grilling, and broiling are all good options. Other ways to reduce fat include: Removing the skin from poultry. Removing all visible fats from meats. Steaming vegetables in water or broth. Meal planning  At meals, imagine dividing your plate into fourths: Fill one-half of your plate with vegetables and green salads. Fill one-fourth of your plate with whole grains. Fill one-fourth of your plate with lean protein foods. Eat 2-4 cups of vegetables per day. One cup of vegetables equals 1 cup (91 g) broccoli or cauliflower florets, 2 medium carrots, 1 large bell pepper, 1 large sweet potato, 1 large tomato, 1 medium white potato, 2 cups (150 g) raw leafy greens. Eat 1-2 cups of fruit per day. One cup of fruit equals 1 small apple, 1 large banana, 1 cup (237 g) mixed fruit, 1 large orange,  cup (82 g) dried fruit, 1 cup (240 mL) 100% fruit juice. Eat more foods that contain soluble fiber. Examples include apples, broccoli, carrots, beans, peas, and barley. Aim to get 25-30 g of fiber per day. Increase your consumption of legumes, nuts, and seeds to 4-5 servings per week. One serving of dried beans or legumes equals  cup (90 g) cooked, 1 serving of nuts is  oz (12 almonds, 24 pistachios, or 7 walnut halves), and 1 serving of seeds equals  oz (8 g). Fats Choose healthy fats more often. Choose monounsaturated and polyunsaturated fats, such as olive and canola oils, avocado oil, flaxseeds, walnuts, almonds, and seeds. Eat more omega-3 fats. Choose salmon, mackerel, sardines, tuna, flaxseed oil, and ground flaxseeds. Aim to eat fish at least 2 times each  week. Check food labels carefully to identify foods with trans fats or high amounts of saturated fat. Limit saturated fats. These are found in animal products, such as meats, butter, and cream. Plant sources of saturated fats include palm oil, palm kernel oil, and coconut oil. Avoid foods with partially hydrogenated oils in them. These contain trans fats. Examples are stick margarine, some tub margarines, cookies, crackers, and other baked goods. Avoid fried foods. General information Eat more home-cooked food and less restaurant, buffet, and fast food. Limit or avoid alcohol. Limit foods that are high in added sugar and simple starches such as foods made using white refined flour (white breads, pastries, sweets). Lose weight if you are overweight. Losing just 5-10% of your body weight can help your overall health and prevent diseases such as diabetes and heart disease. Monitor your sodium intake, especially if you have high blood pressure. Talk with your health care provider about your sodium intake. Try to incorporate more vegetarian meals weekly. What foods should I eat? Fruits All fresh, canned (in natural juice), or frozen fruits. Vegetables Fresh or frozen vegetables (raw, steamed, roasted, or grilled). Green salads. Grains Most grains. Choose whole wheat and whole grains most of the time. Rice and pasta, including brown rice and pastas made with whole wheat. Meats and other proteins Lean, well-trimmed beef, veal, pork, and  lamb. Chicken and [place] without skin. All fish and shellfish. Wild duck, rabbit, pheasant, and venison. Egg whites or low-cholesterol egg substitutes. Dried beans, peas, lentils, and tofu. Seeds and most nuts. Dairy Low-fat or nonfat cheeses, including ricotta and mozzarella. Skim or 1% milk (liquid, powdered, or evaporated). Buttermilk made with low-fat milk. Nonfat or low-fat yogurt. Fats and oils Non-hydrogenated (trans-free) margarines. Vegetable oils, including  soybean, sesame, sunflower, olive, avocado, peanut, safflower, corn, canola, and cottonseed. Salad dressings or mayonnaise made with a vegetable oil. Beverages Water (mineral or sparkling). Coffee and tea. Unsweetened ice tea. Diet beverages. Sweets and desserts Sherbet, gelatin, and fruit ice. Small amounts of dark chocolate. Limit all sweets and desserts. Seasonings and condiments All seasonings and condiments. The items listed above may not be a complete list of foods and beverages you can eat. Contact a dietitian for more options. What foods should I avoid? Fruits Canned fruit in heavy syrup. Fruit in cream or butter sauce. Fried fruit. Limit coconut. Vegetables Vegetables cooked in cheese, cream, or butter sauce. Fried vegetables. Grains Breads made with saturated or trans fats, oils, or whole milk. Croissants. Sweet rolls. Donuts. High-fat crackers, such as cheese crackers and chips. Meats and other proteins Fatty meats, such as hot dogs, ribs, sausage, bacon, rib-eye roast or steak. High-fat deli meats, such as salami and bologna. Caviar. Domestic duck and goose. Organ meats, such as liver. Dairy Cream, sour cream, cream cheese, and creamed cottage cheese. Whole-milk cheeses. Whole or 2% milk (liquid, evaporated, or condensed). Whole buttermilk. Cream sauce or high-fat cheese sauce. Whole-milk yogurt. Fats and oils Meat fat, or shortening. Cocoa butter, hydrogenated oils, palm oil, coconut oil, palm kernel oil. Solid fats and shortenings, including bacon fat, salt pork, lard, and butter. Nondairy cream substitutes. Salad dressings with cheese or sour cream. Beverages Regular sodas and any drinks with added sugar. Sweets and desserts Frosting. Pudding. Cookies. Cakes. Pies. Milk chocolate or white chocolate. Buttered syrups. Full-fat ice cream or ice cream drinks. The items listed above may not be a complete list of foods and beverages to avoid. Contact a dietitian for more  information. Summary Heart-healthy meal planning includes limiting unhealthy fats, increasing healthy fats, limiting salt (sodium) intake and making other diet and lifestyle changes. Lose weight if you are overweight. Losing just 5-10% of your body weight can help your overall health and prevent diseases such as diabetes and heart disease. Focus on eating a balance of foods, including fruits and vegetables, low-fat or nonfat dairy, lean protein, nuts and legumes, whole grains, and heart-healthy oils and fats. This information is not intended to replace advice given to you by your health care provider. Make sure you discuss any questions you have with your health care provider. Document Revised: 06/22/2021 Document Reviewed: 06/22/2021 Elsevier Patient Education  2024 [employer].

## 2023-02-23 DIAGNOSIS — N186 End stage renal disease: Secondary | ICD-10-CM | POA: Diagnosis not present

## 2023-02-23 DIAGNOSIS — D485 Neoplasm of uncertain behavior of skin: Secondary | ICD-10-CM | POA: Diagnosis not present

## 2023-02-23 DIAGNOSIS — Z992 Dependence on renal dialysis: Secondary | ICD-10-CM | POA: Diagnosis not present

## 2023-02-23 DIAGNOSIS — C4441 Basal cell carcinoma of skin of scalp and neck: Secondary | ICD-10-CM | POA: Diagnosis not present

## 2023-02-25 DIAGNOSIS — Z992 Dependence on renal dialysis: Secondary | ICD-10-CM | POA: Diagnosis not present

## 2023-02-25 DIAGNOSIS — N186 End stage renal disease: Secondary | ICD-10-CM | POA: Diagnosis not present

## 2023-02-28 DIAGNOSIS — N186 End stage renal disease: Secondary | ICD-10-CM | POA: Diagnosis not present

## 2023-02-28 DIAGNOSIS — Z992 Dependence on renal dialysis: Secondary | ICD-10-CM | POA: Diagnosis not present

## 2023-03-02 DIAGNOSIS — N186 End stage renal disease: Secondary | ICD-10-CM | POA: Diagnosis not present

## 2023-03-02 DIAGNOSIS — Z992 Dependence on renal dialysis: Secondary | ICD-10-CM | POA: Diagnosis not present

## 2023-03-04 DIAGNOSIS — N186 End stage renal disease: Secondary | ICD-10-CM | POA: Diagnosis not present

## 2023-03-04 DIAGNOSIS — Z992 Dependence on renal dialysis: Secondary | ICD-10-CM | POA: Diagnosis not present

## 2023-03-07 DIAGNOSIS — Z992 Dependence on renal dialysis: Secondary | ICD-10-CM | POA: Diagnosis not present

## 2023-03-07 DIAGNOSIS — N186 End stage renal disease: Secondary | ICD-10-CM | POA: Diagnosis not present

## 2023-03-09 DIAGNOSIS — Z992 Dependence on renal dialysis: Secondary | ICD-10-CM | POA: Diagnosis not present

## 2023-03-09 DIAGNOSIS — N186 End stage renal disease: Secondary | ICD-10-CM | POA: Diagnosis not present

## 2023-03-11 DIAGNOSIS — Z992 Dependence on renal dialysis: Secondary | ICD-10-CM | POA: Diagnosis not present

## 2023-03-11 DIAGNOSIS — N186 End stage renal disease: Secondary | ICD-10-CM | POA: Diagnosis not present

## 2023-03-14 DIAGNOSIS — Z992 Dependence on renal dialysis: Secondary | ICD-10-CM | POA: Diagnosis not present

## 2023-03-14 DIAGNOSIS — N186 End stage renal disease: Secondary | ICD-10-CM | POA: Diagnosis not present

## 2023-03-15 ENCOUNTER — Ambulatory Visit: Payer: Medicare Other | Attending: Physician Assistant

## 2023-03-15 DIAGNOSIS — I5020 Unspecified systolic (congestive) heart failure: Secondary | ICD-10-CM

## 2023-03-15 LAB — ECHOCARDIOGRAM COMPLETE
AR max vel: 1.94 cm2
AV Area VTI: 1.91 cm2
AV Area mean vel: 1.81 cm2
AV Mean grad: 3 mm[Hg]
AV Peak grad: 6.1 mm[Hg]
Ao pk vel: 1.23 m/s
Calc EF: 19.5 %
S' Lateral: 5.5 cm
Single Plane A2C EF: 16.9 %
Single Plane A4C EF: 21.4 %

## 2023-03-16 ENCOUNTER — Telehealth: Payer: Self-pay | Admitting: *Deleted

## 2023-03-16 DIAGNOSIS — N186 End stage renal disease: Secondary | ICD-10-CM | POA: Diagnosis not present

## 2023-03-16 DIAGNOSIS — I5020 Unspecified systolic (congestive) heart failure: Secondary | ICD-10-CM

## 2023-03-16 DIAGNOSIS — Z992 Dependence on renal dialysis: Secondary | ICD-10-CM | POA: Diagnosis not present

## 2023-03-16 NOTE — Telephone Encounter (Signed)
-----   Message from Sharlene Dory sent at 03/16/2023 11:06 AM EDT ----- Can we send a referral to the CHF clinic please?  Thanks! Sharlene Dory, PA-C ----- Message ----- From: Kathleene Hazel, MD Sent: 03/16/2023   9:48 AM EDT To: Sharlene Dory, PA-C  Agree. Thanks Tessa ----- Message ----- From: Bunnie Domino Sent: 03/16/2023   8:09 AM EDT To: Kathleene Hazel, MD; #  Ronald [name],   Unfortunately, your heart pump function has dropped by quite a bit and is now 20 to 25% (compared to your last echo December 2023) he.  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  I think it would be best to get you in with the heart failure clinic for further aggressive treatment.  I will also forward your results to Dr. Clifton James to see if he has any additional recommendations.  I will plan to see you in December.   Thanks! Sharlene Dory, PA-C

## 2023-03-18 DIAGNOSIS — Z992 Dependence on renal dialysis: Secondary | ICD-10-CM | POA: Diagnosis not present

## 2023-03-18 DIAGNOSIS — N186 End stage renal disease: Secondary | ICD-10-CM | POA: Diagnosis not present

## 2023-03-21 DIAGNOSIS — N186 End stage renal disease: Secondary | ICD-10-CM | POA: Diagnosis not present

## 2023-03-21 DIAGNOSIS — Z992 Dependence on renal dialysis: Secondary | ICD-10-CM | POA: Diagnosis not present

## 2023-03-23 DIAGNOSIS — N186 End stage renal disease: Secondary | ICD-10-CM | POA: Diagnosis not present

## 2023-03-23 DIAGNOSIS — Z992 Dependence on renal dialysis: Secondary | ICD-10-CM | POA: Diagnosis not present

## 2023-03-25 DIAGNOSIS — N186 End stage renal disease: Secondary | ICD-10-CM | POA: Diagnosis not present

## 2023-03-25 DIAGNOSIS — Z992 Dependence on renal dialysis: Secondary | ICD-10-CM | POA: Diagnosis not present

## 2023-03-28 DIAGNOSIS — Z992 Dependence on renal dialysis: Secondary | ICD-10-CM | POA: Diagnosis not present

## 2023-03-28 DIAGNOSIS — N186 End stage renal disease: Secondary | ICD-10-CM | POA: Diagnosis not present

## 2023-03-30 DIAGNOSIS — Z992 Dependence on renal dialysis: Secondary | ICD-10-CM | POA: Diagnosis not present

## 2023-03-30 DIAGNOSIS — N186 End stage renal disease: Secondary | ICD-10-CM | POA: Diagnosis not present

## 2023-03-31 DIAGNOSIS — C4441 Basal cell carcinoma of skin of scalp and neck: Secondary | ICD-10-CM | POA: Diagnosis not present

## 2023-03-31 DIAGNOSIS — Z992 Dependence on renal dialysis: Secondary | ICD-10-CM | POA: Diagnosis not present

## 2023-03-31 DIAGNOSIS — N186 End stage renal disease: Secondary | ICD-10-CM | POA: Diagnosis not present

## 2023-03-31 DIAGNOSIS — C4491 Basal cell carcinoma of skin, unspecified: Secondary | ICD-10-CM | POA: Diagnosis not present

## 2023-04-01 DIAGNOSIS — Z992 Dependence on renal dialysis: Secondary | ICD-10-CM | POA: Diagnosis not present

## 2023-04-01 DIAGNOSIS — N186 End stage renal disease: Secondary | ICD-10-CM | POA: Diagnosis not present

## 2023-04-04 DIAGNOSIS — N186 End stage renal disease: Secondary | ICD-10-CM | POA: Diagnosis not present

## 2023-04-04 DIAGNOSIS — Z992 Dependence on renal dialysis: Secondary | ICD-10-CM | POA: Diagnosis not present

## 2023-04-06 DIAGNOSIS — N186 End stage renal disease: Secondary | ICD-10-CM | POA: Diagnosis not present

## 2023-04-06 DIAGNOSIS — Z992 Dependence on renal dialysis: Secondary | ICD-10-CM | POA: Diagnosis not present

## 2023-04-08 DIAGNOSIS — N186 End stage renal disease: Secondary | ICD-10-CM | POA: Diagnosis not present

## 2023-04-08 DIAGNOSIS — Z992 Dependence on renal dialysis: Secondary | ICD-10-CM | POA: Diagnosis not present

## 2023-04-11 DIAGNOSIS — Z992 Dependence on renal dialysis: Secondary | ICD-10-CM | POA: Diagnosis not present

## 2023-04-11 DIAGNOSIS — N186 End stage renal disease: Secondary | ICD-10-CM | POA: Diagnosis not present

## 2023-04-13 DIAGNOSIS — Z992 Dependence on renal dialysis: Secondary | ICD-10-CM | POA: Diagnosis not present

## 2023-04-13 DIAGNOSIS — N186 End stage renal disease: Secondary | ICD-10-CM | POA: Diagnosis not present

## 2023-04-15 ENCOUNTER — Other Ambulatory Visit: Payer: Self-pay | Admitting: Internal Medicine

## 2023-04-15 DIAGNOSIS — Z992 Dependence on renal dialysis: Secondary | ICD-10-CM | POA: Diagnosis not present

## 2023-04-15 DIAGNOSIS — N186 End stage renal disease: Secondary | ICD-10-CM | POA: Diagnosis not present

## 2023-04-15 DIAGNOSIS — I2510 Atherosclerotic heart disease of native coronary artery without angina pectoris: Secondary | ICD-10-CM

## 2023-04-15 NOTE — Telephone Encounter (Signed)
Requested Prescriptions  Pending Prescriptions Disp Refills   atorvastatin (LIPITOR) 40 MG tablet [Pharmacy Med Name: ATORVASTATIN 40 MG TABLET] 90 tablet 0    Sig: TAKE 1 TABLET BY MOUTH EVERYDAY AT BEDTIME     Cardiovascular:  Antilipid - Statins Failed - 04/15/2023  1:38 AM      Failed - Lipid Panel in normal range within the last 12 months    Cholesterol, Total  Date Value Ref Range Status  11/24/2021 153 100 - 199 mg/dL Final   Cholesterol  Date Value Ref Range Status  07/16/2022 109 0 - 200 mg/dL Final   LDL Chol Calc (NIH)  Date Value Ref Range Status  11/24/2021 82 0 - 99 mg/dL Final   LDL Cholesterol  Date Value Ref Range Status  07/16/2022 59 0 - 99 mg/dL Final    Comment:           Total Cholesterol/HDL:CHD Risk Coronary Heart Disease Risk Table                     Men   Women  1/2 Average Risk   3.4   3.3  Average Risk       5.0   4.4  2 X Average Risk   9.6   7.1  3 X Average Risk  23.4   11.0        Use the calculated Patient Ratio above and the CHD Risk Table to determine the patient's CHD Risk.        ATP III CLASSIFICATION (LDL):  <100     mg/dL   Optimal  191-478  mg/dL   Near or Above                    Optimal  130-159  mg/dL   Borderline  295-621  mg/dL   High  >308     mg/dL   Very High Performed at [facility], [address] [place], [place] [address]    HDL  Date Value Ref Range Status  07/16/2022 37 (L) >40 mg/dL Final  69/62/9528 60 >41 mg/dL Final   Triglycerides  Date Value Ref Range Status  07/16/2022 63 <150 mg/dL Final         Passed - Patient is not pregnant      Passed - Valid encounter within last 12 months    Recent Outpatient Visits           5 months ago Muscle tension headache   [place] [facility] at [facility], Nyoka Cowden, MD   6 months ago Essential hypertension   [place] [facility] at [facility], Nyoka Cowden, MD   8 months ago  Prediabetes   [facility] at [facility], Nyoka Cowden, MD   1 year ago Acute pain of right knee   [facility] at [facility], Nyoka Cowden, MD   1 year ago COVID-19 virus infection   [facility] [place], [place], PA-C       Future Appointments             In 1 month [place], Bernadette Hoit, PA-C [employer] at [employer], Administrator   In 4 months [place], Nile Dear, MD [facility]

## 2023-04-18 DIAGNOSIS — Z992 Dependence on renal dialysis: Secondary | ICD-10-CM | POA: Diagnosis not present

## 2023-04-18 DIAGNOSIS — N186 End stage renal disease: Secondary | ICD-10-CM | POA: Diagnosis not present

## 2023-04-20 DIAGNOSIS — Z992 Dependence on renal dialysis: Secondary | ICD-10-CM | POA: Diagnosis not present

## 2023-04-20 DIAGNOSIS — N186 End stage renal disease: Secondary | ICD-10-CM | POA: Diagnosis not present

## 2023-04-22 ENCOUNTER — Other Ambulatory Visit: Payer: Self-pay | Admitting: Cardiovascular Disease

## 2023-04-22 ENCOUNTER — Other Ambulatory Visit: Payer: Self-pay | Admitting: Internal Medicine

## 2023-04-22 DIAGNOSIS — E039 Hypothyroidism, unspecified: Secondary | ICD-10-CM

## 2023-04-22 DIAGNOSIS — Z992 Dependence on renal dialysis: Secondary | ICD-10-CM | POA: Diagnosis not present

## 2023-04-22 DIAGNOSIS — N186 End stage renal disease: Secondary | ICD-10-CM | POA: Diagnosis not present

## 2023-04-22 NOTE — Telephone Encounter (Signed)
Patient will need an office visit for further refills. Requested Prescriptions  Pending Prescriptions Disp Refills   levothyroxine (SYNTHROID) 75 MCG tablet [Pharmacy Med Name: LEVOTHYROXINE 75 MCG TABLET] 90 tablet 0    Sig: TAKE 1 TABLET BY MOUTH DAILY BEFORE BREAKFAST.     Endocrinology:  Hypothyroid Agents Passed - 04/22/2023  2:59 PM      Passed - TSH in normal range and within 360 days    TSH  Date Value Ref Range Status  05/28/2022 1.951 0.350 - 4.500 uIU/mL Final    Comment:    Performed by a 3rd Generation assay with a functional sensitivity of <=0.01 uIU/mL. Performed at [facility], [address] [place], [place] [address]   01/22/2021 2.680 0.450 - 4.500 uIU/mL Final         Passed - Valid encounter within last 12 months    Recent Outpatient Visits           5 months ago Muscle tension headache   [place] [facility] at [facility], Nyoka Cowden, MD   7 months ago Essential hypertension   [place] [facility] at [facility], Nyoka Cowden, MD   9 months ago Prediabetes   [facility] at [facility], Nyoka Cowden, MD   1 year ago Acute pain of right knee   [facility] at [facility], Nyoka Cowden, MD   1 year ago COVID-19 virus infection   [place] [facility] [place], [place], PA-C       Future Appointments             In 3 weeks Asa Lente, Bernadette Hoit, PA-C [employer] at [employer], Administrator   In 4 months Clifton James, Nile Dear, MD [facility] at [facility], [facility]

## 2023-04-25 DIAGNOSIS — Z992 Dependence on renal dialysis: Secondary | ICD-10-CM | POA: Diagnosis not present

## 2023-04-25 DIAGNOSIS — N186 End stage renal disease: Secondary | ICD-10-CM | POA: Diagnosis not present

## 2023-04-27 DIAGNOSIS — Z992 Dependence on renal dialysis: Secondary | ICD-10-CM | POA: Diagnosis not present

## 2023-04-27 DIAGNOSIS — N186 End stage renal disease: Secondary | ICD-10-CM | POA: Diagnosis not present

## 2023-04-29 DIAGNOSIS — N186 End stage renal disease: Secondary | ICD-10-CM | POA: Diagnosis not present

## 2023-04-29 DIAGNOSIS — Z992 Dependence on renal dialysis: Secondary | ICD-10-CM | POA: Diagnosis not present

## 2023-04-30 DIAGNOSIS — N186 End stage renal disease: Secondary | ICD-10-CM | POA: Diagnosis not present

## 2023-04-30 DIAGNOSIS — Z992 Dependence on renal dialysis: Secondary | ICD-10-CM | POA: Diagnosis not present

## 2023-05-02 ENCOUNTER — Ambulatory Visit (HOSPITAL_COMMUNITY)
Admission: RE | Admit: 2023-05-02 | Discharge: 2023-05-02 | Disposition: A | Discharge status: Home / Self Care | Payer: Medicare Other | Source: Ambulatory Visit | Attending: Cardiology | Admitting: Cardiology

## 2023-05-02 ENCOUNTER — Encounter (HOSPITAL_COMMUNITY): Payer: Self-pay | Admitting: Cardiology

## 2023-05-02 VITALS — BP 110/68 | HR 102 | Wt 202.4 lb

## 2023-05-02 DIAGNOSIS — I480 Paroxysmal atrial fibrillation: Secondary | ICD-10-CM | POA: Diagnosis not present

## 2023-05-02 DIAGNOSIS — I255 Ischemic cardiomyopathy: Secondary | ICD-10-CM | POA: Diagnosis not present

## 2023-05-02 DIAGNOSIS — Z7901 Long term (current) use of anticoagulants: Secondary | ICD-10-CM | POA: Diagnosis not present

## 2023-05-02 DIAGNOSIS — E1122 Type 2 diabetes mellitus with diabetic chronic kidney disease: Secondary | ICD-10-CM | POA: Insufficient documentation

## 2023-05-02 DIAGNOSIS — N185 Chronic kidney disease, stage 5: Secondary | ICD-10-CM

## 2023-05-02 DIAGNOSIS — E1140 Type 2 diabetes mellitus with diabetic neuropathy, unspecified: Secondary | ICD-10-CM | POA: Diagnosis not present

## 2023-05-02 DIAGNOSIS — I5020 Unspecified systolic (congestive) heart failure: Secondary | ICD-10-CM | POA: Diagnosis not present

## 2023-05-02 DIAGNOSIS — Z992 Dependence on renal dialysis: Secondary | ICD-10-CM | POA: Diagnosis not present

## 2023-05-02 DIAGNOSIS — I132 Hypertensive heart and chronic kidney disease with heart failure and with stage 5 chronic kidney disease, or end stage renal disease: Secondary | ICD-10-CM | POA: Diagnosis not present

## 2023-05-02 DIAGNOSIS — Z79899 Other long term (current) drug therapy: Secondary | ICD-10-CM | POA: Insufficient documentation

## 2023-05-02 DIAGNOSIS — N186 End stage renal disease: Secondary | ICD-10-CM | POA: Diagnosis not present

## 2023-05-02 MED ORDER — METOPROLOL TARTRATE 100 MG PO TABS: 100.0000 mg | ORAL_TABLET | Freq: Two times a day (BID) | ORAL | 3 refills | Status: DC

## 2023-05-02 NOTE — Progress Notes (Signed)
   ADVANCED HEART FAILURE NEW PATIENT CLINIC NOTE  Referring Physician: Reubin Milan, MD  Primary Care: Reubin Milan, MD Primary Cardiologist:  HPI: Ronald [name] is a 77 y.o. male with a PMH of *** who presents for initial visit for further evaluation and treatment of heart failure/cardiomyopathy.      Left heart catheterization 07/28/2017 showed significant native CAD with total occlusion of LAD after the takeoff of first diagonal vessel with total occlusion of proximal left circumflex coronary artery and total occlusion of proximal RCA with antegrade bridging collaterals.  Patent LIMA to LAD.  Patent Y graft from surgery which supplies diagonal and distal circumflex marginal vessels.    Was in the ED 11/10/2022 after a fall getting off his lawnmower.  CT scan negative for acute traumatic injury.  Was in the ED at 11/08/2022 due to headache on the right side.  Hit his head roughly a week prior on the left side.  CT scan reassuring with no acute abnormalities or skull fracture.  Patient had labs which were reassuring.  Admitted 07/15/2022 due to midsternal chest pain.  Heparin drip and medical management.  Admitted 05/28/2022 due to history of heart failure.  Diuresis with Lasix gtt.  Cardiology and nephrology consults obtained.    Continues to have leg swelling but markedly improved since starting dialysis M, W, F.      SUBJECTIVE:   PMH, current medications, allergies, social history, and family history reviewed in epic.  PHYSICAL EXAM: There were no vitals filed for this visit. GENERAL: Well nourished and in no apparent distress at rest.  HEENT: The mucous membranes are pink and moist.   PULM:  Normal work of breathing, clear to auscultation bilaterally. Respirations are unlabored.  CARDIAC:  JVP: ***         Normal rate with regular rhythm. No murmurs, rubs or gallops.  *** edema.  ABDOMEN: Soft, non-tender, non-distended. NEUROLOGIC: Patient is oriented x3 with no focal  or lateralizing neurologic deficits.  PSYCH: Patients affect is appropriate, there is no evidence of anxiety or depression.  SKIN: Warm and dry; no lesions or wounds. Warm and well perfused extremities.  DATA REVIEW  ECG: ***    ECHO: ***  CATH: ***    Heart failure review: - Classification: {HFCLASS:30917} - Etiology: {Cardiomyopathy:30918} - NYHA Class:  - Volume status: {volumechf:30919} - ACEi/ARB/ARNI: {HF:30752} - Aldosterone antagonist: {HF:30752} - Beta-blocker: {HF:30752} - Digoxin: {HF:30752} - Hydralazine/Nitrates: {HF:30752} - SGLT2i: {HF:30752} - GLP-1: {GLP:30906} - Advanced therapies: {Advancedtherapies:30916} - ICD: {ICD:30901}   ASSESSMENT & PLAN:  ***    Clearnce Hasten, MD Advanced Heart Failure Mechanical Circulatory Support 05/02/23

## 2023-05-02 NOTE — Patient Instructions (Signed)
STOP Imdur  INCREASE Metoprolol to 100 mg daily.  Your physician recommends that you schedule a follow-up appointment in: 3 months in the [place] office. ** PLEASE CALL THE OFFICE IN [place] AT [phone] TO ARRANGE YOUR FOLLOW UP APPOINTMENT. **  If you have any questions or concerns before your next appointment please send [place] a message through [place] or call our office at [phone].    TO LEAVE A MESSAGE FOR THE NURSE SELECT OPTION 2, PLEASE LEAVE A MESSAGE INCLUDING: YOUR NAME DATE OF BIRTH CALL BACK NUMBER REASON FOR CALL**this is important as we prioritize the call backs  YOU WILL RECEIVE A CALL BACK THE SAME DAY AS LONG AS YOU CALL BEFORE 4:00 PM  At the Advanced Heart Failure Clinic, you and your health needs are our priority. As part of our continuing mission to provide you with exceptional heart care, we have created designated Provider Care Teams. These Care Teams include your primary Cardiologist (physician) and Advanced Practice Providers (APPs- Physician Assistants and Nurse Practitioners) who all work together to provide you with the care you need, when you need it.   You may see any of the following providers on your designated Care Team at your next follow up: Dr Arvilla Meres Dr Marca Ancona Dr. Dorthula Nettles Dr. Clearnce Hasten Amy Filbert Schilder, NP Robbie Lis, [place] [facility] [place], [place] Brynda Peon, NP [place] Lee, NP Karle Plumber, PharmD   Please be sure to bring in all your medications bottles to every appointment.    Thank you for choosing [facility]

## 2023-05-03 MED ORDER — METOPROLOL SUCCINATE ER 100 MG PO TB24: 100.0000 mg | ORAL_TABLET | Freq: Every day | ORAL | 11 refills | Status: DC

## 2023-05-04 DIAGNOSIS — N186 End stage renal disease: Secondary | ICD-10-CM | POA: Diagnosis not present

## 2023-05-04 DIAGNOSIS — Z992 Dependence on renal dialysis: Secondary | ICD-10-CM | POA: Diagnosis not present

## 2023-05-06 DIAGNOSIS — N186 End stage renal disease: Secondary | ICD-10-CM | POA: Diagnosis not present

## 2023-05-06 DIAGNOSIS — Z992 Dependence on renal dialysis: Secondary | ICD-10-CM | POA: Diagnosis not present

## 2023-05-09 ENCOUNTER — Encounter (INDEPENDENT_AMBULATORY_CARE_PROVIDER_SITE_OTHER): Payer: Medicare Other

## 2023-05-09 ENCOUNTER — Ambulatory Visit (INDEPENDENT_AMBULATORY_CARE_PROVIDER_SITE_OTHER): Payer: Medicare Other | Admitting: Vascular Surgery

## 2023-05-09 DIAGNOSIS — N186 End stage renal disease: Secondary | ICD-10-CM | POA: Diagnosis not present

## 2023-05-09 DIAGNOSIS — Z992 Dependence on renal dialysis: Secondary | ICD-10-CM | POA: Diagnosis not present

## 2023-05-11 DIAGNOSIS — N186 End stage renal disease: Secondary | ICD-10-CM | POA: Diagnosis not present

## 2023-05-11 DIAGNOSIS — Z992 Dependence on renal dialysis: Secondary | ICD-10-CM | POA: Diagnosis not present

## 2023-05-13 DIAGNOSIS — N186 End stage renal disease: Secondary | ICD-10-CM | POA: Diagnosis not present

## 2023-05-13 DIAGNOSIS — Z992 Dependence on renal dialysis: Secondary | ICD-10-CM | POA: Diagnosis not present

## 2023-05-16 DIAGNOSIS — Z992 Dependence on renal dialysis: Secondary | ICD-10-CM | POA: Diagnosis not present

## 2023-05-16 DIAGNOSIS — N186 End stage renal disease: Secondary | ICD-10-CM | POA: Diagnosis not present

## 2023-05-18 DIAGNOSIS — Z992 Dependence on renal dialysis: Secondary | ICD-10-CM | POA: Diagnosis not present

## 2023-05-18 DIAGNOSIS — N186 End stage renal disease: Secondary | ICD-10-CM | POA: Diagnosis not present

## 2023-05-19 ENCOUNTER — Ambulatory Visit: Payer: Medicare Other | Admitting: Physician Assistant

## 2023-05-19 NOTE — Progress Notes (Signed)
Chief Complaint  Patient presents with   Follow-up    Ischemic cardiomyopathy    History of Present Illness: 77 yo male with history of CAD s/p CABG in 1996 (LIMA-LAD, SVG-circumflex, SVG-RCA, SVG-diagonal) with redo bypass in 2007 (SVG-OM, SVG-RCA), ischemic cardiomyopathy, ESRD on HD, DM 2, HTN, HLD, AAA, renal artery stenosis, COPD, GERD, atrial fibrillation and PVCs here today for cardiac follow up. He was hospitalized at [facility] July 2016 with a NSTEMI. Cardiac cath with severe disease in the SVG to OM treated with a drug eluting stent. He was treated with Brilinta post PCI but developed dyspnea and was changed to Plavix. He was admitted to [facility] May 2017 with unstable angina and cardiac cath showed severe disease in the distal body/anastomosis of the SVG to Diagonal.  A drug eluting stent was placed in this vein graft. The LIMA to LAD was patent, SVG to OM was patent, SVG to PDA/PLA was patent. I saw him in the office in January 2019 and he was volume overloaded. Lasix was started. He was admitted to [facility] February 2019 with unstable angina and cardiac cath showed patency of all of the bypass grafts. Echo February 2019 with normal LV systolic function, LVEF=55-60%, grade 1 diastolic dysfunction. He was admitted to [facility] January 2020 with atrial fib/flutter with RVR. Echo January 2020 with LVEF=30-35%. Mild MR. Abdominal u/s April 2022 with 3.1 cm AAA, stable. He was seen in December 2023 and was in atrial flutter. Amiodarone was increased but he did not convert to sinus. He was cardioverted on 12/07/06/21. He was admitted to [facility] on 05/29/22 with dyspnea, chest pain and LE edema. Echo 05/29/22 with LVEF=35-40% with global hypokinesis-unchanged. Mild MR. Aortic valve sclerosis. He was diuresed. He was seen by Dr. Ladona Ridgel while admitted. He was seen in the atrial fib clinic on 06/17/22 and was back in atrial fib. His amiodarone was lowered to 200 mg daily. He was not felt to be a good candidate for ablation. He  is now on amiodarone, Eliquis and Toprol. He was started on HD in March 2024. Echo October 2024 with LVEF=20-25%. Moderate MR. He was seen in the Advanced Heart Failure clinic on 05/02/23 and was doing well, still using Lasix on non-dialysis days. Imdur was stopped and Toprol was increased.    He is here today for follow up. The patient denies any chest pain, palpitations, lower extremity edema, orthopnea, PND, dizziness, near syncope or syncope. Baseline dyspnea on exertion.   Primary Care Physician: Reubin Milan, MD  Past Medical History:  Diagnosis Date   AAA (abdominal aortic aneurysm) (HCC)    a. 3cm by [place] 2015.   Arthritis    "hips; back" (12/13/2014)   CAD (coronary artery disease) 2007   a. s/p CABG- IMA-LAD, VG-Cx, VG-RCA, VG-diag in 1999. B. sp redo CABG- VG-OM, VG-RCA in 2007 due to VG disease. c. NSTEMI 11/2014 s/p DES to SVG-OM from the Y graft.d. PTCA/DES x 1 distal body of SVG to Diagonal.09/2015   Chronic combined systolic and diastolic CHF (congestive heart failure) (HCC)    a. remote EF 40-45% in 2006. b. Normal EF 2014. b. Echo 07/2016 EF 45-50%, grade 1 DD. c. Echo 2020 30% to 35%. Diffuse EF 30-35%, diffuse hypokinesis.   Chronic lower back pain    CKD (chronic kidney disease), stage IV (HCC)    COPD (chronic obstructive pulmonary disease) (HCC)    Deafness in left ear    Degenerative disc disease, lumbar    Diabetes mellitus,  type 2 (HCC) 10/04/2014   Microalbumin 05/11/2012-100. Foot exam/monofilament 05/11/2012-normal.   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 noted on CT 2008.   Type II diabetes mellitus (HCC)    Diet control    Unstable angina (HCC) 07/27/2017   Walking pneumonia 1990's   Wears dentures    full upper    Past Surgical History:  Procedure Laterality Date   CARDIAC CATHETERIZATION  "several"   CARDIAC CATHETERIZATION N/A 12/13/2014   Procedure: Left Heart Cath and Coronary Angiography;  Surgeon: Corky Crafts, MD;  Location: [facility] INVASIVE CV LAB;  Service: Cardiovascular;  Laterality: N/A;   CARDIAC CATHETERIZATION  12/13/2014   Procedure: Coronary Stent Intervention;  Surgeon: Corky Crafts, MD;  Location: [facility] INVASIVE CV LAB;  Service: Cardiovascular;;   CARDIAC CATHETERIZATION N/A 10/07/2015   Procedure: Left Heart Cath and Cors/Grafts Angiography;  Surgeon: Kathleene Hazel, MD;  Location: [facility] INVASIVE CV LAB;  Service: Cardiovascular;  Laterality: N/A;   CARDIAC CATHETERIZATION N/A 10/07/2015   Procedure: Coronary Stent Intervention;  Surgeon: Kathleene Hazel, MD;  Location: [facility] INVASIVE CV LAB;  Service: Cardiovascular;  Laterality: N/A;   CARDIOVERSION N/A 05/25/2022   Procedure: CARDIOVERSION;  Surgeon: Thomasene Ripple, DO;  Location: [facility] ENDOSCOPY;  Service: Cardiovascular;  Laterality: N/A;   CATARACT EXTRACTION W/PHACO Left 10/21/2021   Procedure: CATARACT EXTRACTION PHACO AND INTRAOCULAR LENS PLACEMENT (IOC) LEFT 3.94 00:33.7;  Surgeon: Nevada Crane, MD;  Location: [facility];  Service: Ophthalmology;  Laterality: Left;   CATARACT EXTRACTION W/PHACO Right 11/09/2021   Procedure: CATARACT EXTRACTION PHACO AND INTRAOCULAR LENS PLACEMENT (IOC) RIGHT;  Surgeon: Nevada Crane, MD;  Location: [facility];  Service: Ophthalmology;  Laterality: Right;  3.59 0:29.4   CORONARY ANGIOPLASTY  "several"   CORONARY ANGIOPLASTY WITH STENT PLACEMENT  2005; 12/13/2014    "2; 1"   CORONARY ARTERY BYPASS GRAFT  05/31/1994   CABG X5   CORONARY ARTERY BYPASS GRAFT  07/29/2005   CABG X3   DIALYSIS/PERMA CATHETER INSERTION N/A 09/10/2022   Procedure: DIALYSIS/PERMA CATHETER INSERTION;  Surgeon: Renford Dills, MD;  Location: [facility] INVASIVE CV LAB;  Service: Cardiovascular;  Laterality: N/A;   ESOPHAGOGASTRODUODENOSCOPY (EGD) WITH ESOPHAGEAL DILATION  05/31/1998   GREEN LIGHT LASER TURP (TRANSURETHRAL RESECTION OF PROSTATE  01/30/1999   "not cancerous"   HERNIA REPAIR     LAPAROSCOPIC CHOLECYSTECTOMY     LEFT HEART CATH AND CORS/GRAFTS ANGIOGRAPHY N/A 07/28/2017   Procedure: LEFT HEART CATH AND CORS/GRAFTS ANGIOGRAPHY;  Surgeon: Lennette Bihari, MD;  Location: [facility] INVASIVE CV LAB;  Service: Cardiovascular;  Laterality: N/A;   LUNG SURGERY  05/31/1994   "S/P CABG, had to put staple in lung after it had collapsed"   MINOR REMOVAL OF PERITONEAL DIALYSIS CATHETER  10/2022   UMBILICAL HERNIA REPAIR     w/chole    Current Outpatient Medications  Medication Sig Dispense Refill   acetaminophen (TYLENOL)  650 MG CR tablet Take 1,300 mg by mouth at bedtime.     allopurinol (ZYLOPRIM) 100 MG tablet TAKE 1 TABLET BY MOUTH EVERYDAY AT BEDTIME 90 tablet 1   amiodarone (PACERONE) 200 MG tablet TAKE 1 TABLET BY MOUTH EVERY DAY 90 tablet 1   apixaban (ELIQUIS) 5 MG TABS tablet Take 1 tablet (5 mg total) by mouth 2 (two) times daily. 60 tablet 5   atorvastatin (LIPITOR) 40 MG tablet TAKE 1 TABLET BY MOUTH EVERYDAY AT BEDTIME 90 tablet 0   Cholecalciferol (VITAMIN D-3) 125 MCG (5000 UT) TABS Take 2,000 Units by mouth daily.      diphenhydrAMINE (BENADRYL) 50 MG capsule Take 50 mg by mouth at bedtime.     furosemide (LASIX) 40 MG tablet Take 40 mg by mouth daily as needed for edema. Taking 1 1/2 tablets (60mg  total) Pt takes on Tue. Thur. Sat.and Sunday's.     levothyroxine (SYNTHROID) 75 MCG tablet TAKE 1 TABLET BY MOUTH DAILY BEFORE BREAKFAST. 90 tablet 0   metoprolol  succinate (TOPROL XL) 100 MG 24 hr tablet Take 1 tablet (100 mg total) by mouth daily. Take with or immediately following a meal. 30 tablet 11   Multiple Vitamin (MULTIVITAMIN PO) Take 1 tablet by mouth daily.     nitroGLYCERIN (NITROSTAT) 0.4 MG SL tablet Place 0.4 mg under the tongue every 5 (five) minutes as needed for chest pain (Up to 3 times).     pantoprazole (PROTONIX) 40 MG tablet Take 1 tablet (40 mg total) by mouth 2 (two) times daily. Home med.     senna (SENOKOT) 8.6 MG tablet Take 1 tablet by mouth as needed for constipation.     sevelamer carbonate (RENVELA) 800 MG tablet Take 800 mg by mouth 3 (three) times daily with meals. (Patient not taking: Reported on 05/20/2023)     No current facility-administered medications for this visit.    Allergies  Allergen Reactions   Predicort [Prednisolone] Other (See Comments)    Stomach pain   Ciprofloxacin Other (See Comments)    GI upset   Hydrochlorothiazide Other (See Comments)    Dehydration   Hydrocodone Nausea Only and Other (See Comments)    Stomach upset    Hydrocodone-Acetaminophen Nausea Only    Stomach upset   Sulfa Antibiotics Other (See Comments)    Cannot recall   Penicillins Hives, Rash and Other (See Comments)    Has patient had a PCN reaction causing immediate rash, facial/tongue/throat swelling, SOB or lightheadedness with hypotension: YES  Has patient had a PCN reaction causing severe rash involving mucus membranes or skin necrosis: NO  Has patient had a PCN reaction that required hospitalization NO  Has patient had a PCN reaction occurring within the last 10 years:NO  If all of the above answers are "NO", then may proceed with Cephalosporin use.  Has patient had a PCN reaction causing immediate rash, facial/tongue/throat swelling, SOB or lightheadedness with hypotension: YES Has patient had a PCN reaction causing severe rash involving mucus membranes or skin necrosis: NO Has patient had a PCN reaction that  required hospitalization NO Has patient had a PCN reaction occurring within the last 10 years:NO If all of the above answers are "NO", then may proceed with Cephalosporin use.    Social History   Socioeconomic History   Marital status: Married    Spouse name: Cotton Poirot   Number of children: 2   Years of education: Not on file   Highest education level: 8th grade  Occupational History   Occupation: Retired  Tobacco Use   Smoking status: Former    Current packs/day: 0.00    Average packs/day: 3.0 packs/day for 20.0 years (60.0 ttl pk-yrs)    Types: Cigarettes    Start date: 07/25/1966    Quit date: 07/25/1986    Years since quitting: 36.8   Smokeless tobacco: Never  Vaping Use   Vaping status: Never Used  Substance and Sexual Activity   Alcohol use: No    Alcohol/week: 0.0 standard drinks of alcohol   Drug use: No   Sexual activity: Not Currently  Other Topics Concern   Not on file  Social History Narrative   Did auto salvage work.   Lives at home with his wife.  Independent at baseline.   Social Drivers of Health   Financial Resource Strain: Medium Risk (01/11/2023)   Received from [facility]   Overall Financial Resource Strain (CARDIA)    Difficulty of Paying Living Expenses: Somewhat hard  Food Insecurity: No Food Insecurity (01/11/2023)   [facility] from [facility]   Hunger Vital Sign    Worried About Running Out of Food in the Last Year: Never true    Ran Out of Food in the Last Year: Never true  Transportation Needs: No Transportation Needs (01/11/2023)   Received from [facility] - Transportation    Lack of Transportation (Medical): No    Lack of Transportation (Non-Medical): No  Physical Activity: Unknown (01/11/2023)   Received from [facility]   Exercise Vital Sign    Days of Exercise per Week: 0 days    Minutes of Exercise per Session: Not on file  Stress: No Stress Concern Present (10/15/2022)   Received from [facility] - Occupational Stress Questionnaire    Feeling of Stress : Not at all  Social Connections: Somewhat Isolated (01/11/2023)   Received from [facility]   Social Network    How would you rate your social network (family, work, friends)?: Restricted participation with some degree of social isolation  Intimate Partner Violence: Not At Risk (01/11/2023)   Received from [facility]   HITS    Over the last 12 months how often did your partner physically hurt you?: Never    Over the last 12 months how often did your partner insult you or talk down to you?: Never    Over the last 12 months how often did your partner threaten you with physical harm?: Never    Over the last 12 months how often did your partner scream or curse at you?: Never    Family History  Problem Relation Age of Onset   Heart attack Mother        MI   Stroke Mother    Heart disease Mother    Hypertension Mother    Hyperlipidemia Mother    Asthma Mother    Heart disease Father    Rheumatic fever Father    Colon cancer Neg Hx     Review of Systems:  As stated in the HPI and otherwise negative.   BP 112/60 (BP Location: Right Arm, Patient Position: Sitting)   Pulse (!) 102   Ht 5\' 9"  (1.753 m)   Wt 91.4 kg   SpO2 98%   BMI 29.77 kg/m   Physical Examination: General: Well developed, well nourished, NAD  HEENT: OP clear, mucus membranes moist  SKIN: warm, dry. No rashes. Neuro: No focal  deficits  Musculoskeletal: Muscle strength 5/5 all ext  Psychiatric: Mood and affect normal  Neck: No JVD, no carotid bruits, no thyromegaly, no lymphadenopathy.  Lungs:Clear bilaterally, no wheezes, rhonci, crackles Cardiovascular: Regular rate and rhythm. No murmurs, gallops or rubs. Abdomen:Soft. Bowel sounds present. Non-tender.  Extremities: No lower extremity edema. Pulses are 2 + in the bilateral DP/PT.  EKG:  EKG is not ordered today. The ekg ordered today  demonstrates    Recent Labs: 05/28/2022: TSH 1.951 09/13/2022: B Natriuretic Peptide 1,700.3 09/16/2022: Magnesium 1.9 11/08/2022: ALT 26; BUN 16; Creatinine, Ser 3.03; Hemoglobin 13.4; Platelets 157; Potassium 4.0; Sodium 140   Lipid Panel    Component Value Date/Time   CHOL 109 07/16/2022 0456   CHOL 153 11/24/2021 1625   TRIG 63 07/16/2022 0456   HDL 37 (L) 07/16/2022 0456   HDL 60 11/24/2021 1625   CHOLHDL 2.9 07/16/2022 0456   VLDL 13 07/16/2022 0456   LDLCALC 59 07/16/2022 0456   LDLCALC 82 11/24/2021 1625     Wt Readings from Last 3 Encounters:  05/20/23 91.4 kg  05/02/23 91.8 kg  02/22/23 90 kg    Assessment and Plan:   1. CAD s/p CABG without angina: Cardiac cath February 2019 with 4 patent vein grafts. No chest pain suggestive of angina. Continue statin and beta blocker. He is off of ASA due to easy bruising. He is also on Eliquis.       2. Ischemic Cardiomyopathy/chronic systolic CHF: LVEF=20-25% by echo in October 2024. He is followed in the Advanced Heart Failure clinic. Continue beta blocker.     3. Hypertension: BP is controlled. No changes   4. Hyperlipidemia: Lipids followed in primary care. LDL 59 in February 2024. Continue statin.    5. ESRD on HD   6. PVCs/Bigeminy: No palpitations. Continue beta blocker   8. Atrial fibrillation, paroxysmal: Sinus on exam today. Continue amiodarone, Eliquis and Toprol.     10. Sleep apnea: he did not tolerate CPAP  Labs/ tests ordered today include:  No orders of the defined types were placed in this encounter.  Disposition:   F/U with me in 12 months.    Signed, Verne Carrow, MD 05/20/2023 3:06 PM    [facility] [address] [place], [place], [place]  [address] Phone: [phone]; Fax: [phone]

## 2023-05-20 ENCOUNTER — Encounter: Payer: Self-pay | Admitting: Cardiovascular Disease

## 2023-05-20 ENCOUNTER — Ambulatory Visit: Payer: Medicare Other | Attending: Cardiovascular Disease | Admitting: Cardiovascular Disease

## 2023-05-20 VITALS — BP 112/60 | HR 102 | Ht 69.0 in | Wt 201.6 lb

## 2023-05-20 DIAGNOSIS — Z992 Dependence on renal dialysis: Secondary | ICD-10-CM | POA: Diagnosis not present

## 2023-05-20 DIAGNOSIS — I5020 Unspecified systolic (congestive) heart failure: Secondary | ICD-10-CM

## 2023-05-20 DIAGNOSIS — I255 Ischemic cardiomyopathy: Secondary | ICD-10-CM

## 2023-05-20 DIAGNOSIS — I2510 Atherosclerotic heart disease of native coronary artery without angina pectoris: Secondary | ICD-10-CM

## 2023-05-20 DIAGNOSIS — I480 Paroxysmal atrial fibrillation: Secondary | ICD-10-CM

## 2023-05-20 DIAGNOSIS — N186 End stage renal disease: Secondary | ICD-10-CM | POA: Diagnosis not present

## 2023-05-20 DIAGNOSIS — I10 Essential (primary) hypertension: Secondary | ICD-10-CM | POA: Diagnosis not present

## 2023-05-20 DIAGNOSIS — E7800 Pure hypercholesterolemia, unspecified: Secondary | ICD-10-CM

## 2023-05-20 NOTE — Patient Instructions (Signed)
Medication Instructions:  No changes *If you need a refill on your cardiac medications before your next appointment, please call your pharmacy*   Lab Work: none If you have labs (blood work) drawn today and your tests are completely normal, you will receive your results only by: [place] (if you have MyChart) OR A paper copy in the mail If you have any lab test that is abnormal or we need to change your treatment, we will call you to review the results.   Testing/Procedures: none   Follow-Up: At [facility], you and your health needs are our priority.  As part of our continuing mission to provide you with exceptional heart care, we have created designated Provider Care Teams.  These Care Teams include your primary Cardiologist (physician) and Advanced Practice Providers (APPs -  Physician Assistants and Nurse Practitioners) who all work together to provide you with the care you need, when you need it.  We recommend signing up for the patient portal called "MyChart".  Sign up information is provided on this After Visit Summary.  MyChart is used to connect with patients for Virtual Visits (Telemedicine).  Patients are able to view lab/test results, encounter notes, upcoming appointments, etc.  Non-urgent messages can be sent to your provider as well.   To learn more about what you can do with MyChart, go to [email or web address].    Your next appointment:   12 month(s)  Provider:   Lauree Chandler, MD

## 2023-05-21 ENCOUNTER — Other Ambulatory Visit: Payer: Self-pay | Admitting: Cardiovascular Disease

## 2023-05-23 DIAGNOSIS — Z992 Dependence on renal dialysis: Secondary | ICD-10-CM | POA: Diagnosis not present

## 2023-05-23 DIAGNOSIS — N186 End stage renal disease: Secondary | ICD-10-CM | POA: Diagnosis not present

## 2023-05-23 NOTE — Telephone Encounter (Signed)
Prescription refill request for Eliquis received. Indication:afib Last office visit:12/24 Scr:5.17  7/24 Age: 77 Weight:91.4  kg  Prescription refilled

## 2023-05-24 DIAGNOSIS — Z992 Dependence on renal dialysis: Secondary | ICD-10-CM | POA: Diagnosis not present

## 2023-05-24 DIAGNOSIS — N186 End stage renal disease: Secondary | ICD-10-CM | POA: Diagnosis not present

## 2023-05-27 ENCOUNTER — Encounter: Payer: Self-pay | Admitting: Family Medicine

## 2023-05-27 ENCOUNTER — Ambulatory Visit (INDEPENDENT_AMBULATORY_CARE_PROVIDER_SITE_OTHER): Payer: Medicare Other | Admitting: Family Medicine

## 2023-05-27 VITALS — BP 120/62 | HR 108 | Ht 69.0 in | Wt 200.8 lb

## 2023-05-27 DIAGNOSIS — J019 Acute sinusitis, unspecified: Secondary | ICD-10-CM

## 2023-05-27 DIAGNOSIS — Z992 Dependence on renal dialysis: Secondary | ICD-10-CM | POA: Diagnosis not present

## 2023-05-27 DIAGNOSIS — N186 End stage renal disease: Secondary | ICD-10-CM | POA: Diagnosis not present

## 2023-05-27 MED ORDER — BENZONATATE 100 MG PO CAPS
100.0000 mg | ORAL_CAPSULE | Freq: Two times a day (BID) | ORAL | 0 refills | Status: DC | PRN
Start: 2023-05-27 — End: 2023-07-19

## 2023-05-27 MED ORDER — AZITHROMYCIN 250 MG PO TABS
ORAL_TABLET | ORAL | 0 refills | Status: AC
Start: 2023-05-27 — End: 2023-06-01

## 2023-05-30 ENCOUNTER — Encounter: Payer: Self-pay | Admitting: Family Medicine

## 2023-05-30 DIAGNOSIS — N186 End stage renal disease: Secondary | ICD-10-CM | POA: Diagnosis not present

## 2023-05-30 DIAGNOSIS — Z992 Dependence on renal dialysis: Secondary | ICD-10-CM | POA: Diagnosis not present

## 2023-05-30 NOTE — Progress Notes (Signed)
Primary Care / Sports Medicine Office Visit  Patient Information:  Patient ID: [name], male DOB: Jan 04, 1946 Age: 77 y.o. MRN: [id number]   Ronald [name] is a pleasant 77 y.o. male presenting with the following:  Chief Complaint  Patient presents with   Cough    Cough since Monday 05/23/23. Patient has a wet cough. The cough is productive. Patient has taken OTC cough medication, it has helped some but he continues to have cough.     Vitals:   05/27/23 1340  BP: 120/62  Pulse: (!) 108  SpO2: 97%   Vitals:   05/27/23 1340  Weight: 200 lb 12.8 oz (91.1 kg)  Height: 5\' 9"  (1.753 m)   Body mass index is 29.65 kg/m.  No results found.   Independent interpretation of notes and tests performed by another provider:   None  Procedures performed:   None  Pertinent History, Exam, Impression, and Recommendations:   Problem List Items Addressed This Visit       Respiratory   Acute sinusitis - Primary   History of Present Illness [name], a patient with a history of congestive heart failure, ESRD on dialysis, presents with a persistent cough that started over the weekend. The cough is productive, with the patient reporting the production of yellow sputum. The patient also reports throat irritation due to the constant coughing, which has been disruptive to his daily activities, including his dialysis sessions. The patient has been taking over-the-counter cough and cold medication, which provides some relief. However, the cough returns once the throat becomes congested again. The patient also reports stomach pain and changes in bowel movements, which have become looser. The patient's family member notes that the patient did not eat anything all day until the evening. The patient also reports pressure in the nasal bridge. The patient's breathing difficulty is reported to be at baseline levels.  Physical Exam VITALS: SaO2 higher than previous measurement HEENT: External  auditory canals and tympanic membranes normal bilaterally. Nasal discharge more pronounced on the left side. Oropharynx without erythema or exudates. CHEST: Crackles at lung bases, partially cleared after coughing. CARDIOVASCULAR: Heart sounds normal. EXTREMITIES: No ankle edema.  Assessment and Plan Upper Respiratory Infection - Sinusitis Persistent cough with yellow sputum production, sinus pressure, and throat irritation for 7 days. No fever or chills. No ear pain. No worsening of baseline dyspnea. -Prescribe Azithromycin (Z-Pak) to cover possible bacterial infection. -Prescribe Tessalon Perles for cough. -Recommend over-the-counter saline nasal spray for postnasal drip. -If symptoms do not improve or worsen over the next week, patient should contact the office for possible chest x-ray.  Gastrointestinal Discomfort Abdominal discomfort possibly related to excessive coughing and postnasal drip. Loose stools noted yesterday, but unclear if this is a change from baseline. -Continue current management. Monitor symptoms.  Follow-up No specific follow-up appointment needed. Patient instructed to message office if cough does not improve or worsens over the next week.      Relevant Medications   azithromycin (ZITHROMAX) 250 MG tablet   benzonatate (TESSALON) 100 MG capsule     Orders & Medications Medications:  Meds ordered this encounter  Medications   azithromycin (ZITHROMAX) 250 MG tablet    Sig: Take 2 tablets on day 1, then 1 tablet daily on days 2 through 5    Dispense:  6 tablet    Refill:  0   benzonatate (TESSALON) 100 MG capsule    Sig: Take 1 capsule (100 mg total) by mouth  2 (two) times daily as needed for cough.    Dispense:  20 capsule    Refill:  0   No orders of the defined types were placed in this encounter.    No follow-ups on file.     [name], MD, [facility]   Primary Care Sports Medicine Primary Care and Sports Medicine at [facility]

## 2023-05-30 NOTE — Assessment & Plan Note (Addendum)
History of Present Illness [name], a patient with a history of congestive heart failure, ESRD on dialysis, presents with a persistent cough that started over the weekend. The cough is productive, with the patient reporting the production of yellow sputum. The patient also reports throat irritation due to the constant coughing, which has been disruptive to his daily activities, including his dialysis sessions. The patient has been taking over-the-counter cough and cold medication, which provides some relief. However, the cough returns once the throat becomes congested again. The patient also reports stomach pain and changes in bowel movements, which have become looser. The patient's family member notes that the patient did not eat anything all day until the evening. The patient also reports pressure in the nasal bridge. The patient's breathing difficulty is reported to be at baseline levels.  Physical Exam VITALS: SaO2 higher than previous measurement HEENT: External auditory canals and tympanic membranes normal bilaterally. Nasal discharge more pronounced on the left side. Oropharynx without erythema or exudates. CHEST: Crackles at lung bases, partially cleared after coughing. CARDIOVASCULAR: Heart sounds normal. EXTREMITIES: No ankle edema.  Assessment and Plan Upper Respiratory Infection - Sinusitis Persistent cough with yellow sputum production, sinus pressure, and throat irritation for 7 days. No fever or chills. No ear pain. No worsening of baseline dyspnea. -Prescribe Azithromycin (Z-Pak) to cover possible bacterial infection. -Prescribe Tessalon Perles for cough. -Recommend over-the-counter saline nasal spray for postnasal drip. -If symptoms do not improve or worsen over the next week, patient should contact the office for possible chest x-ray.  Gastrointestinal Discomfort Abdominal discomfort possibly related to excessive coughing and postnasal drip. Loose stools noted yesterday, but  unclear if this is a change from baseline. -Continue current management. Monitor symptoms.  Follow-up No specific follow-up appointment needed. Patient instructed to message office if cough does not improve or worsens over the next week.

## 2023-05-30 NOTE — Patient Instructions (Signed)
YOUR PLAN:  -UPPER RESPIRATORY INFECTION: An upper respiratory infection is a condition that affects the nose, throat, and airways, often causing symptoms like cough, sinus pressure, and throat irritation. You have been prescribed Azithromycin (Z-Pak) to treat a possible bacterial infection and Tessalon Perles to help with your cough. Additionally, you should use an over-the-counter saline nasal spray to alleviate postnasal drip. If your symptoms do not improve or worsen over the next week, please contact our office for a possible chest x-ray.  -GASTROINTESTINAL DISCOMFORT: Your stomach pain and loose stools may be related to excessive coughing and postnasal drip. Continue with your current management and monitor your symptoms.  -CHRONIC CONDITIONS: We reviewed your chronic conditions, including your dialysis-dependent status and history of gout. There are no new acute issues, so you should continue with your current management.  INSTRUCTIONS:  No specific follow-up appointment is needed at this time. However, if your cough does not improve or worsens over the next week, please message our office.

## 2023-05-31 ENCOUNTER — Ambulatory Visit
Admission: RE | Admit: 2023-05-31 | Discharge: 2023-05-31 | Disposition: A | Discharge status: Home / Self Care | Payer: Medicare Other | Source: Ambulatory Visit | Attending: Internal Medicine | Admitting: Internal Medicine

## 2023-05-31 ENCOUNTER — Encounter: Payer: Self-pay | Admitting: Internal Medicine

## 2023-05-31 ENCOUNTER — Other Ambulatory Visit
Admission: RE | Admit: 2023-05-31 | Discharge: 2023-05-31 | Disposition: A | Discharge status: Home / Self Care | Payer: Medicare Other | Source: Home / Self Care | Attending: Internal Medicine | Admitting: Internal Medicine

## 2023-05-31 ENCOUNTER — Ambulatory Visit
Admission: RE | Admit: 2023-05-31 | Discharge: 2023-05-31 | Disposition: A | Discharge status: Home / Self Care | Payer: Medicare Other | Attending: Internal Medicine | Admitting: Internal Medicine

## 2023-05-31 ENCOUNTER — Ambulatory Visit (INDEPENDENT_AMBULATORY_CARE_PROVIDER_SITE_OTHER): Payer: Medicare Other | Admitting: Internal Medicine

## 2023-05-31 VITALS — BP 116/62 | HR 99 | Temp 98.1°F | Ht 69.0 in | Wt 200.8 lb

## 2023-05-31 DIAGNOSIS — N186 End stage renal disease: Secondary | ICD-10-CM | POA: Diagnosis not present

## 2023-05-31 DIAGNOSIS — J189 Pneumonia, unspecified organism: Secondary | ICD-10-CM | POA: Insufficient documentation

## 2023-05-31 DIAGNOSIS — I701 Atherosclerosis of renal artery: Secondary | ICD-10-CM

## 2023-05-31 DIAGNOSIS — R069 Unspecified abnormalities of breathing: Secondary | ICD-10-CM | POA: Diagnosis not present

## 2023-05-31 DIAGNOSIS — R059 Cough, unspecified: Secondary | ICD-10-CM | POA: Diagnosis not present

## 2023-05-31 DIAGNOSIS — Z992 Dependence on renal dialysis: Secondary | ICD-10-CM | POA: Diagnosis not present

## 2023-05-31 DIAGNOSIS — I517 Cardiomegaly: Secondary | ICD-10-CM | POA: Diagnosis not present

## 2023-05-31 DIAGNOSIS — R918 Other nonspecific abnormal finding of lung field: Secondary | ICD-10-CM | POA: Diagnosis not present

## 2023-05-31 LAB — CBC WITH DIFFERENTIAL/PLATELET
Abs Immature Granulocytes: 0.03 10*3/uL (ref 0.00–0.07)
Basophils Absolute: 0 10*3/uL (ref 0.0–0.1)
Basophils Relative: 1 %
Eosinophils Absolute: 0.1 10*3/uL (ref 0.0–0.5)
Eosinophils Relative: 1 %
HCT: 34.9 % — ABNORMAL LOW (ref 39.0–52.0)
Hemoglobin: 11.6 g/dL — ABNORMAL LOW (ref 13.0–17.0)
Immature Granulocytes: 1 %
Lymphocytes Relative: 10 %
Lymphs Abs: 0.6 10*3/uL — ABNORMAL LOW (ref 0.7–4.0)
MCH: 31.2 pg (ref 26.0–34.0)
MCHC: 33.2 g/dL (ref 30.0–36.0)
MCV: 93.8 fL (ref 80.0–100.0)
Monocytes Absolute: 0.7 10*3/uL (ref 0.1–1.0)
Monocytes Relative: 12 %
Neutro Abs: 4.4 10*3/uL (ref 1.7–7.7)
Neutrophils Relative %: 75 %
Platelets: 127 10*3/uL — ABNORMAL LOW (ref 150–400)
RBC: 3.72 MIL/uL — ABNORMAL LOW (ref 4.22–5.81)
RDW: 15.1 % (ref 11.5–15.5)
WBC: 5.8 10*3/uL (ref 4.0–10.5)
nRBC: 0 % (ref 0.0–0.2)

## 2023-05-31 LAB — COMPREHENSIVE METABOLIC PANEL
ALT: 76 U/L — ABNORMAL HIGH (ref 0–44)
AST: 86 U/L — ABNORMAL HIGH (ref 15–41)
Albumin: 3.4 g/dL — ABNORMAL LOW (ref 3.5–5.0)
Alkaline Phosphatase: 154 U/L — ABNORMAL HIGH (ref 38–126)
Anion gap: 12 (ref 5–15)
BUN: 50 mg/dL — ABNORMAL HIGH (ref 8–23)
CO2: 22 mmol/L (ref 22–32)
Calcium: 8.4 mg/dL — ABNORMAL LOW (ref 8.9–10.3)
Chloride: 99 mmol/L (ref 98–111)
Creatinine, Ser: 6.58 mg/dL — ABNORMAL HIGH (ref 0.61–1.24)
GFR, Estimated: 8 mL/min — ABNORMAL LOW (ref >60)
Glucose, Bld: 109 mg/dL — ABNORMAL HIGH (ref 70–99)
Potassium: 3.9 mmol/L (ref 3.5–5.1)
Sodium: 133 mmol/L — ABNORMAL LOW (ref 135–145)
Total Bilirubin: 1.3 mg/dL — ABNORMAL HIGH (ref 0.0–1.2)
Total Protein: 7.2 g/dL (ref 6.5–8.1)

## 2023-05-31 MED ORDER — PROMETHAZINE-DM 6.25-15 MG/5ML PO SYRP
2.5000 mL | ORAL_SOLUTION | Freq: Four times a day (QID) | ORAL | 0 refills | Status: AC | PRN
Start: 2023-05-31 — End: 2023-06-12

## 2023-05-31 NOTE — Progress Notes (Signed)
 Date:  05/31/2023   Name:  Ronald [name] [facility]   DOB:  11/21/1945   MRN:  [id number]   Chief Complaint: Cough (Patient coughing with phlegm coming up. Soreness in ribs due to cough. Started last week. Was seen and prescribed Zpack and took last pill today. Patient informed it still takes 5 more days for abx to work in system. Patient said he feels weak and it losing his appetite. )  Cough This is a new problem. The current episode started in the past 7 days. The problem has been gradually worsening. The problem occurs every few minutes. Cough characteristics: thick phlegm that is hard to get up. Associated symptoms include postnasal drip and shortness of breath. Pertinent negatives include no chest pain, chills, fever, sore throat or wheezing. Treatments tried: Zpak finished today; tessalon  perles. The treatment provided no relief. His past medical history is significant for COPD.  Per wife, he is weak and not eating or drinking well.  He attended HD yesterday and will go tomorrow.  Review of Systems  Constitutional:  Positive for appetite change. Negative for chills, fever and unexpected weight change.  HENT:  Positive for congestion and postnasal drip. Negative for sinus pressure and sore throat.   Respiratory:  Positive for cough, chest tightness and shortness of breath. Negative for wheezing.   Cardiovascular:  Negative for chest pain and palpitations.  Gastrointestinal:  Negative for abdominal pain, diarrhea and vomiting.  Psychiatric/Behavioral:  Positive for sleep disturbance. Negative for dysphoric mood. The patient is not nervous/anxious.      Lab Results  Component Value Date   NA 140 11/08/2022   K 4.0 11/08/2022   CO2 26 11/08/2022   GLUCOSE 106 (H) 11/08/2022   BUN 16 11/08/2022   CREATININE 3.03 (H) 11/08/2022   CALCIUM  8.7 (L) 11/08/2022   EGFR 14 (L) 05/19/2022   GFRNONAA 20 (L) 11/08/2022   Lab Results  Component Value Date   CHOL 109 07/16/2022   HDL 37 (L)  07/16/2022   LDLCALC 59 07/16/2022   TRIG 63 07/16/2022   CHOLHDL 2.9 07/16/2022   Lab Results  Component Value Date   TSH 1.951 05/28/2022   Lab Results  Component Value Date   HGBA1C 5.8 09/06/2022   Lab Results  Component Value Date   WBC 4.8 11/08/2022   HGB 13.4 11/08/2022   HCT 43.6 11/08/2022   MCV 89.7 11/08/2022   PLT 157 11/08/2022   Lab Results  Component Value Date   ALT 26 11/08/2022   AST 47 (H) 11/08/2022   ALKPHOS 207 (H) 11/08/2022   BILITOT 1.5 (H) 11/08/2022   Lab Results  Component Value Date   VD25OH 83 09/06/2022     Patient Active Problem List   Diagnosis Date Noted   Muscle tension headache 11/15/2022   Long term (current) use of anticoagulants 11/10/2022   ESRD on dialysis (HCC) 09/16/2022   Physical deconditioning 09/16/2022   Bilateral leg edema 09/13/2022   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) 07/22/2022   Coronary artery disease of native artery of native heart with stable angina pectoris (HCC) 07/22/2022   OSA (obstructive sleep apnea) 07/22/2022   Chronic systolic CHF (congestive heart failure) (HCC) 07/15/2022   Typical atrial flutter (HCC) 07/15/2022   Acute on chronic combined systolic (congestive) and diastolic (congestive) heart failure (HCC) 05/28/2022   Hypothyroidism 05/28/2022   GERD without esophagitis 05/28/2022   Essential hypertension 05/05/2022   Prediabetes 04/06/2022  Spinal stenosis of lumbar region without neurogenic claudication 04/06/2022   Weakness of both lower extremities 04/06/2022   Morbid obesity (HCC) 04/06/2022   Upper airway cough syndrome 10/01/2021   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   Bursitis of hip 12/18/2018   Paroxysmal atrial fibrillation (HCC) 06/10/2018   Atherosclerosis of native arteries of extremity with intermittent claudication (HCC) 06/25/2016   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   Coronary artery disease involving  native coronary artery with angina pectoris (HCC)    Allergic rhinitis 08/19/2015   Anemia of renal disease 10/04/2014   AA (aortic aneurysm) (HCC) 10/04/2014   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   Basal cell papilloma 10/04/2014   Unspecified osteoarthritis, unspecified site 10/04/2014   Thoracic aortic aneurysm (TAA) (HCC) 10/04/2014   Atherosclerotic heart disease of native coronary artery without angina pectoris 10/04/2014   Benign prostatic hyperplasia with lower urinary tract symptoms 10/24/2012   DOE (dyspnea on exertion) 11/12/2009   Renal artery stenosis     Mixed hyperlipidemia 09/04/2008   History of redo bypass grafting 09/04/2008    Allergies  Allergen Reactions   Predicort [Prednisolone] Other (See Comments)    Stomach pain   Ciprofloxacin Other (See Comments)    GI upset   Hydrochlorothiazide Other (See Comments)    Dehydration   Hydrocodone  Nausea Only and Other (See Comments)    Stomach upset    Hydrocodone -Acetaminophen  Nausea Only    Stomach upset   Sulfa Antibiotics Other (See Comments)    Cannot recall   Penicillins Hives, Rash and Other (See Comments)    Has patient had a PCN reaction causing immediate rash, facial/tongue/throat swelling, SOB or lightheadedness with hypotension: YES  Has patient had a PCN reaction causing severe rash involving mucus membranes or skin necrosis: NO  Has patient had a PCN reaction that required hospitalization NO  Has patient had a PCN reaction occurring within the last 10 years:NO  If all of the above answers are NO, then may proceed with Cephalosporin use.  Has patient had a PCN reaction causing immediate rash, facial/tongue/throat swelling, SOB or lightheadedness with hypotension: YES Has patient had a PCN reaction causing severe rash involving mucus membranes or skin necrosis: NO Has patient had a PCN reaction that required hospitalization NO Has patient had a PCN reaction occurring within the last 10 years:NO If all of  the above answers are NO, then may proceed with Cephalosporin use.    Past Surgical History:  Procedure Laterality Date   CARDIAC CATHETERIZATION  several   CARDIAC CATHETERIZATION N/A 12/13/2014   Procedure: Left Heart Cath and Coronary Angiography;  Surgeon: Candyce GORMAN Reek, MD;  Location: [facility] INVASIVE CV LAB;  Service: Cardiovascular;  Laterality: N/A;   CARDIAC CATHETERIZATION  12/13/2014   Procedure: Coronary Stent Intervention;  Surgeon: Candyce GORMAN Reek, MD;  Location: [facility] INVASIVE CV LAB;  Service: Cardiovascular;;   CARDIAC CATHETERIZATION N/A 10/07/2015   Procedure: Left Heart Cath and Cors/Grafts Angiography;  Surgeon: Lonni JONETTA Cash, MD;  Location: [facility] INVASIVE CV LAB;  Service: Cardiovascular;  Laterality: N/A;   CARDIAC CATHETERIZATION N/A 10/07/2015   Procedure: Coronary Stent Intervention;  Surgeon: Lonni JONETTA Cash, MD;  Location: [facility] INVASIVE CV LAB;  Service: Cardiovascular;  Laterality: N/A;   CARDIOVERSION N/A 05/25/2022   Procedure: CARDIOVERSION;  Surgeon: Sheena Pugh, DO;  Location: [facility] ENDOSCOPY;  Service: Cardiovascular;  Laterality: N/A;   CATARACT EXTRACTION W/PHACO Left 10/21/2021   Procedure: CATARACT EXTRACTION PHACO AND INTRAOCULAR LENS PLACEMENT (IOC) LEFT 3.94 00:33.7;  Surgeon: Myrna Adine Anes, MD;  Location: [facility] SURGERY CNTR;  Service: Ophthalmology;  Laterality: Left;   CATARACT EXTRACTION W/PHACO Right 11/09/2021   Procedure: CATARACT EXTRACTION PHACO AND INTRAOCULAR LENS PLACEMENT (IOC) RIGHT;  Surgeon: Myrna Adine Anes, MD;  Location: [facility] SURGERY [facility]TR;  Service: Ophthalmology;  Laterality: Right;  3.59 0:29.4   CORONARY ANGIOPLASTY  several   CORONARY ANGIOPLASTY WITH STENT PLACEMENT  2005; 12/13/2014   2; 1   CORONARY ARTERY BYPASS GRAFT  05/31/1994   CABG X5   CORONARY ARTERY BYPASS GRAFT  07/29/2005   CABG X3   DIALYSIS/PERMA CATHETER INSERTION N/A 09/10/2022   Procedure: DIALYSIS/PERMA CATHETER INSERTION;   Surgeon: Jama Cordella MATSU, MD;  Location: [facility] INVASIVE CV LAB;  Service: Cardiovascular;  Laterality: N/A;   ESOPHAGOGASTRODUODENOSCOPY (EGD) WITH ESOPHAGEAL DILATION  05/31/1998   GREEN LIGHT LASER TURP (TRANSURETHRAL RESECTION OF PROSTATE  01/30/1999   not cancerous   HERNIA REPAIR     LAPAROSCOPIC CHOLECYSTECTOMY     LEFT HEART CATH AND CORS/GRAFTS ANGIOGRAPHY N/A 07/28/2017   Procedure: LEFT HEART CATH AND CORS/GRAFTS ANGIOGRAPHY;  Surgeon: Burnard Debby LABOR, MD;  Location: [facility] INVASIVE CV LAB;  Service: Cardiovascular;  Laterality: N/A;   LUNG SURGERY  05/31/1994   S/P CABG, had to put staple in lung after it had collapsed   MINOR REMOVAL OF PERITONEAL DIALYSIS CATHETER  10/2022   UMBILICAL HERNIA REPAIR     w/chole    Social History   Tobacco Use   Smoking status: Former    Current packs/day: 0.00    Average packs/day: 3.0 packs/day for 20.0 years (60.0 ttl pk-yrs)    Types: Cigarettes    Start date: 07/25/1966    Quit date: 07/25/1986    Years since quitting: 36.8   Smokeless tobacco: Never  Vaping Use   Vaping status: Never Used  Substance Use Topics   Alcohol use: No    Alcohol/week: 0.0 standard drinks of alcohol   Drug use: No     Medication list has been reviewed and updated.  Current Meds  Medication Sig   acetaminophen  (TYLENOL ) 650 MG CR tablet Take 1,300 mg by mouth at bedtime.   allopurinol  (ZYLOPRIM ) 100 MG tablet TAKE 1 TABLET BY MOUTH EVERYDAY AT BEDTIME   amiodarone  (PACERONE ) 200 MG tablet TAKE 1 TABLET BY MOUTH EVERY DAY   atorvastatin  (LIPITOR) 40 MG tablet TAKE 1 TABLET BY MOUTH EVERYDAY AT BEDTIME   azithromycin  (ZITHROMAX ) 250 MG tablet Take 2 tablets on day 1, then 1 tablet daily on days 2 through 5   benzonatate  (TESSALON ) 100 MG capsule Take 1 capsule (100 mg total) by mouth 2 (two) times daily as needed for cough.   Cholecalciferol  (VITAMIN D -3) 125 MCG (5000 UT) TABS Take 2,000 Units by mouth daily.    diphenhydrAMINE  (BENADRYL ) 50 MG  capsule Take 50 mg by mouth at bedtime.   ELIQUIS  5 MG TABS tablet TAKE 1 TABLET BY MOUTH TWICE A DAY   furosemide  (LASIX ) 40 MG tablet Take 40 mg by mouth daily as needed for edema. Taking 1 1/2 tablets (60mg  total) Pt takes on Tue. Thur. Sat.and Sunday's.   levothyroxine  (SYNTHROID ) 75 MCG tablet TAKE 1 TABLET BY MOUTH DAILY BEFORE BREAKFAST.   metoprolol  succinate (TOPROL  XL) 100 MG 24 hr tablet Take 1 tablet (100 mg total) by mouth daily. Take with or immediately following a meal.   Multiple Vitamin (MULTIVITAMIN PO) Take 1 tablet by mouth daily.   nitroGLYCERIN  (NITROSTAT ) 0.4 MG SL tablet Place  0.4 mg under the tongue every 5 (five) minutes as needed for chest pain (Up to 3 times).   pantoprazole  (PROTONIX ) 40 MG tablet Take 1 tablet (40 mg total) by mouth 2 (two) times daily. Home med.   promethazine -dextromethorphan (PROMETHAZINE -DM) 6.25-15 MG/5ML syrup Take 2.5 mLs by mouth 4 (four) times daily as needed for up to 12 days for cough.   senna (SENOKOT) 8.6 MG tablet Take 1 tablet by mouth as needed for constipation.       05/27/2023    1:46 PM 09/24/2022    2:04 PM 07/26/2022    1:48 PM 04/06/2022    3:38 PM  GAD 7 : Generalized Anxiety Score  Nervous, Anxious, on Edge 0 1 3 2   Control/stop worrying 0 1 3 2   Worry too much - different things 0 1 3 3   Trouble relaxing 0 3 3 2   Restless 0 2 0 2  Easily annoyed or irritable 2 0 0 2  Afraid - awful might happen 0 0 2 2  Total GAD 7 Score 2 8 14 15   Anxiety Difficulty Not difficult at all Somewhat difficult Somewhat difficult Extremely difficult       05/27/2023    1:45 PM 09/24/2022    2:03 PM 07/26/2022    1:47 PM  Depression screen PHQ 2/9  Decreased Interest 2 0 2  Down, Depressed, Hopeless 0 0 2  PHQ - 2 Score 2 0 4  Altered sleeping 2 3 2   Tired, decreased energy 2 3 0  Change in appetite 2 1 2   Feeling bad or failure about yourself  0 1 2  Trouble concentrating 0 0 0  Moving slowly or fidgety/restless 1 3 3   Suicidal  thoughts 0 0 0  PHQ-9 Score 9 11 13   Difficult doing work/chores Somewhat difficult Not difficult at all Somewhat difficult    BP Readings from Last 3 Encounters:  05/31/23 116/62  05/27/23 120/62  05/20/23 112/60    Physical Exam Constitutional:      Appearance: He is ill-appearing.  Cardiovascular:     Rate and Rhythm: Normal rate and regular rhythm.  Pulmonary:     Effort: No respiratory distress.     Breath sounds: Rhonchi present. No wheezing.  Musculoskeletal:     Cervical back: Normal range of motion.  Lymphadenopathy:     Cervical: No cervical adenopathy.  Skin:    General: Skin is warm and dry.     Comments: Poor skin turgor  Neurological:     Mental Status: He is alert.     Wt Readings from Last 3 Encounters:  05/31/23 200 lb 12.8 oz (91.1 kg)  05/27/23 200 lb 12.8 oz (91.1 kg)  05/20/23 201 lb 9.6 oz (91.4 kg)    BP 116/62   Pulse 99   Temp 98.1 F (36.7 C) (Oral)   Ht 5' 9 (1.753 m)   Wt 200 lb 12.8 oz (91.1 kg)   SpO2 95%   BMI 29.65 kg/m   Assessment and Plan:  Problem List Items Addressed This Visit   None Visit Diagnoses       Community acquired pneumonia, unspecified laterality    -  Primary   Will get STAT labs and CXR samples Xyzal 2.5 mg daily with low dose promethazine -DM due to RF/HD if WBC elevated or CXR abnormal will send to ED   Relevant Medications   promethazine -dextromethorphan (PROMETHAZINE -DM) 6.25-15 MG/5ML syrup   Other Relevant Orders   CBC with Differential/Platelet  Comprehensive metabolic panel   DG Chest 2 View       No follow-ups on file.    Leita HILARIO Adie, MD [facility] Primary Care and Sports Medicine [place]

## 2023-05-31 NOTE — Patient Instructions (Signed)
Xyzol 5 mg - take 1/2 tablet daily for post nasal drainage.  Take 1/2 teaspoon of cough syrup every 6 hours as needed for cough.

## 2023-06-01 DIAGNOSIS — N186 End stage renal disease: Secondary | ICD-10-CM | POA: Diagnosis not present

## 2023-06-01 DIAGNOSIS — Z992 Dependence on renal dialysis: Secondary | ICD-10-CM | POA: Diagnosis not present

## 2023-06-02 ENCOUNTER — Encounter (INDEPENDENT_AMBULATORY_CARE_PROVIDER_SITE_OTHER): Payer: Medicare Other

## 2023-06-02 ENCOUNTER — Ambulatory Visit (INDEPENDENT_AMBULATORY_CARE_PROVIDER_SITE_OTHER): Payer: Medicare Other | Admitting: Vascular Surgery

## 2023-06-03 DIAGNOSIS — N186 End stage renal disease: Secondary | ICD-10-CM | POA: Diagnosis not present

## 2023-06-03 DIAGNOSIS — Z992 Dependence on renal dialysis: Secondary | ICD-10-CM | POA: Diagnosis not present

## 2023-06-04 ENCOUNTER — Encounter: Payer: Self-pay | Admitting: Internal Medicine

## 2023-06-05 ENCOUNTER — Other Ambulatory Visit: Payer: Self-pay | Admitting: Internal Medicine

## 2023-06-05 DIAGNOSIS — J189 Pneumonia, unspecified organism: Secondary | ICD-10-CM

## 2023-06-05 MED ORDER — DOXYCYCLINE HYCLATE 100 MG PO TABS
100.0000 mg | ORAL_TABLET | Freq: Two times a day (BID) | ORAL | 0 refills | Status: AC
Start: 2023-06-05 — End: 2023-06-15

## 2023-06-06 DIAGNOSIS — N186 End stage renal disease: Secondary | ICD-10-CM | POA: Diagnosis not present

## 2023-06-06 DIAGNOSIS — Z992 Dependence on renal dialysis: Secondary | ICD-10-CM | POA: Diagnosis not present

## 2023-06-07 DIAGNOSIS — D696 Thrombocytopenia, unspecified: Secondary | ICD-10-CM | POA: Diagnosis not present

## 2023-06-07 DIAGNOSIS — I4891 Unspecified atrial fibrillation: Secondary | ICD-10-CM | POA: Diagnosis not present

## 2023-06-07 DIAGNOSIS — R7989 Other specified abnormal findings of blood chemistry: Secondary | ICD-10-CM | POA: Diagnosis not present

## 2023-06-07 DIAGNOSIS — D62 Acute posthemorrhagic anemia: Secondary | ICD-10-CM | POA: Diagnosis not present

## 2023-06-07 DIAGNOSIS — S01511A Laceration without foreign body of lip, initial encounter: Secondary | ICD-10-CM | POA: Diagnosis not present

## 2023-06-07 DIAGNOSIS — J449 Chronic obstructive pulmonary disease, unspecified: Secondary | ICD-10-CM | POA: Diagnosis not present

## 2023-06-07 DIAGNOSIS — I5022 Chronic systolic (congestive) heart failure: Secondary | ICD-10-CM | POA: Diagnosis not present

## 2023-06-07 DIAGNOSIS — E119 Type 2 diabetes mellitus without complications: Secondary | ICD-10-CM | POA: Diagnosis not present

## 2023-06-07 DIAGNOSIS — N186 End stage renal disease: Secondary | ICD-10-CM | POA: Diagnosis not present

## 2023-06-07 DIAGNOSIS — G4733 Obstructive sleep apnea (adult) (pediatric): Secondary | ICD-10-CM | POA: Diagnosis not present

## 2023-06-07 DIAGNOSIS — Z992 Dependence on renal dialysis: Secondary | ICD-10-CM | POA: Diagnosis not present

## 2023-06-07 DIAGNOSIS — E039 Hypothyroidism, unspecified: Secondary | ICD-10-CM | POA: Diagnosis not present

## 2023-06-08 DIAGNOSIS — E039 Hypothyroidism, unspecified: Secondary | ICD-10-CM | POA: Diagnosis not present

## 2023-06-08 DIAGNOSIS — I4891 Unspecified atrial fibrillation: Secondary | ICD-10-CM | POA: Diagnosis not present

## 2023-06-08 DIAGNOSIS — I5022 Chronic systolic (congestive) heart failure: Secondary | ICD-10-CM | POA: Diagnosis not present

## 2023-06-08 DIAGNOSIS — G4733 Obstructive sleep apnea (adult) (pediatric): Secondary | ICD-10-CM | POA: Diagnosis not present

## 2023-06-08 DIAGNOSIS — J449 Chronic obstructive pulmonary disease, unspecified: Secondary | ICD-10-CM | POA: Diagnosis not present

## 2023-06-08 DIAGNOSIS — Z992 Dependence on renal dialysis: Secondary | ICD-10-CM | POA: Diagnosis not present

## 2023-06-08 DIAGNOSIS — E119 Type 2 diabetes mellitus without complications: Secondary | ICD-10-CM | POA: Diagnosis not present

## 2023-06-08 DIAGNOSIS — D696 Thrombocytopenia, unspecified: Secondary | ICD-10-CM | POA: Diagnosis not present

## 2023-06-08 DIAGNOSIS — D62 Acute posthemorrhagic anemia: Secondary | ICD-10-CM | POA: Diagnosis not present

## 2023-06-08 DIAGNOSIS — R7989 Other specified abnormal findings of blood chemistry: Secondary | ICD-10-CM | POA: Diagnosis not present

## 2023-06-08 DIAGNOSIS — S01511A Laceration without foreign body of lip, initial encounter: Secondary | ICD-10-CM | POA: Diagnosis not present

## 2023-06-08 DIAGNOSIS — N186 End stage renal disease: Secondary | ICD-10-CM | POA: Diagnosis not present

## 2023-06-09 ENCOUNTER — Ambulatory Visit (INDEPENDENT_AMBULATORY_CARE_PROVIDER_SITE_OTHER): Payer: Medicare Other | Admitting: Vascular Surgery

## 2023-06-10 DIAGNOSIS — Z992 Dependence on renal dialysis: Secondary | ICD-10-CM | POA: Diagnosis not present

## 2023-06-10 DIAGNOSIS — N186 End stage renal disease: Secondary | ICD-10-CM | POA: Diagnosis not present

## 2023-06-13 DIAGNOSIS — Z992 Dependence on renal dialysis: Secondary | ICD-10-CM | POA: Diagnosis not present

## 2023-06-13 DIAGNOSIS — N186 End stage renal disease: Secondary | ICD-10-CM | POA: Diagnosis not present

## 2023-06-15 DIAGNOSIS — N186 End stage renal disease: Secondary | ICD-10-CM | POA: Diagnosis not present

## 2023-06-15 DIAGNOSIS — Z992 Dependence on renal dialysis: Secondary | ICD-10-CM | POA: Diagnosis not present

## 2023-06-17 DIAGNOSIS — Z992 Dependence on renal dialysis: Secondary | ICD-10-CM | POA: Diagnosis not present

## 2023-06-17 DIAGNOSIS — N186 End stage renal disease: Secondary | ICD-10-CM | POA: Diagnosis not present

## 2023-06-20 DIAGNOSIS — Z992 Dependence on renal dialysis: Secondary | ICD-10-CM | POA: Diagnosis not present

## 2023-06-20 DIAGNOSIS — N186 End stage renal disease: Secondary | ICD-10-CM | POA: Diagnosis not present

## 2023-06-23 ENCOUNTER — Ambulatory Visit: Payer: Medicare Other | Admitting: Emergency Medicine

## 2023-06-23 VITALS — Ht 69.0 in | Wt 200.0 lb

## 2023-06-23 DIAGNOSIS — Z0000 Encounter for general adult medical examination without abnormal findings: Secondary | ICD-10-CM | POA: Diagnosis not present

## 2023-06-23 NOTE — Progress Notes (Cosign Needed)
Subjective:   Ronald [name] is a 78 y.o. male who presents for Medicare Annual/Subsequent preventive examination.  Visit Complete: Virtual I connected with  Ronald [name] on 06/23/23 by a video and audio enabled telemedicine application and verified that I am speaking with the correct person using two identifiers.  Patient Location: Home  Provider Location: Home Office  I discussed the limitations of evaluation and management by telemedicine. The patient expressed understanding and agreed to proceed.  Vital Signs: Because this visit was a virtual/telehealth visit, some criteria may be missing or patient reported. Any vitals not documented were not able to be obtained and vitals that have been documented are patient reported.  Patient Medicare AWV questionnaire was completed by the patient on 06/22/23; I have confirmed that all information answered by patient is correct and no changes since this date.  Cardiac Risk Factors include: advanced age (>14men, >41 women);male gender;hypertension;diabetes mellitus;dyslipidemia;Other (see comment), Risk factor comments: CAD, OSA (cpap), prediabetic     Objective:    Today's Vitals   06/23/23 1503 06/23/23 1504  Weight: 200 lb (90.7 kg)   Height: 5\' 9"  (1.753 m)   PainSc:  5    Body mass index is 29.53 kg/m.     06/23/2023    3:26 PM 11/10/2022    9:28 PM 11/08/2022    8:09 PM 09/14/2022    8:00 AM 09/13/2022    2:28 PM 07/15/2022    5:11 AM 06/25/2022    1:52 PM  Advanced Directives  Does Patient Have a Medical Advance Directive? No No No No No No No  Would patient like information on creating a medical advance directive? Yes (MAU/Ambulatory/Procedural Areas - Information given)   No - Patient declined  No - Patient declined No - Patient declined    Current Medications (verified) Outpatient Encounter Medications as of 06/23/2023  Medication Sig   acetaminophen (TYLENOL) 650 MG CR tablet Take 1,300 mg by mouth at bedtime.    allopurinol (ZYLOPRIM) 100 MG tablet TAKE 1 TABLET BY MOUTH EVERYDAY AT BEDTIME   amiodarone (PACERONE) 200 MG tablet TAKE 1 TABLET BY MOUTH EVERY DAY   atorvastatin (LIPITOR) 40 MG tablet TAKE 1 TABLET BY MOUTH EVERYDAY AT BEDTIME   Cholecalciferol (VITAMIN D-3) 125 MCG (5000 UT) TABS Take 2,000 Units by mouth daily.    diphenhydrAMINE (BENADRYL) 50 MG capsule Take 50 mg by mouth at bedtime.   ELIQUIS 5 MG TABS tablet TAKE 1 TABLET BY MOUTH TWICE A DAY   furosemide (LASIX) 40 MG tablet Take 40 mg by mouth daily as needed for edema. Taking 1 1/2 tablets (60mg  total) Pt takes on Tue. Thur. Sat.and Sunday's.   levothyroxine (SYNTHROID) 75 MCG tablet TAKE 1 TABLET BY MOUTH DAILY BEFORE BREAKFAST.   metoprolol succinate (TOPROL XL) 100 MG 24 hr tablet Take 1 tablet (100 mg total) by mouth daily. Take with or immediately following a meal.   Multiple Vitamin (MULTIVITAMIN PO) Take 1 tablet by mouth daily.   nitroGLYCERIN (NITROSTAT) 0.4 MG SL tablet Place 0.4 mg under the tongue every 5 (five) minutes as needed for chest pain (Up to 3 times).   pantoprazole (PROTONIX) 40 MG tablet Take 1 tablet (40 mg total) by mouth 2 (two) times daily. Home med.   senna (SENOKOT) 8.6 MG tablet Take 1 tablet by mouth as needed for constipation.   benzonatate (TESSALON) 100 MG capsule Take 1 capsule (100 mg total) by mouth 2 (two) times daily as needed for cough. (Patient  not taking: Reported on 06/23/2023)   No facility-administered encounter medications on file as of 06/23/2023.    Allergies (verified) Predicort [prednisolone], Ciprofloxacin, Hydrochlorothiazide, Hydrocodone, Hydrocodone-acetaminophen, Sulfa antibiotics, and Penicillins   History: Past Medical History:  Diagnosis Date   AAA (abdominal aortic aneurysm) (HCC)    a. 3cm by [place] 2015.   Acute on chronic combined systolic (congestive) and diastolic (congestive) heart failure (HCC) 05/28/2022   Arthritis    "hips; back" (12/13/2014)   CAD (coronary  artery disease) 2007   a. s/p CABG- IMA-LAD, VG-Cx, VG-RCA, VG-diag in 1999. B. sp redo CABG- VG-OM, VG-RCA in 2007 due to VG disease. c. NSTEMI 11/2014 s/p DES to SVG-OM from the Y graft.d. PTCA/DES x 1 distal body of SVG to Diagonal.09/2015   Chronic combined systolic and diastolic CHF (congestive heart failure) (HCC)    a. remote EF 40-45% in 2006. b. Normal EF 2014. b. Echo 07/2016 EF 45-50%, grade 1 DD. c. Echo 2020 30% to 35%. Diffuse EF 30-35%, diffuse hypokinesis.   Chronic lower back pain    CKD (chronic kidney disease), stage IV (HCC)    COPD (chronic obstructive pulmonary disease) (HCC)    Deafness in left ear    Degenerative disc disease, lumbar    Diabetes mellitus, type 2 (HCC) 10/04/2014   Microalbumin 05/11/2012-100. Foot exam/monofilament 05/11/2012-normal.   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 noted on CT 2008.   Type II diabetes mellitus (HCC)    Diet control    Unstable angina (HCC) 07/27/2017   Walking pneumonia 1990's   Wears dentures    full upper   Past Surgical History:  Procedure Laterality Date   CARDIAC CATHETERIZATION  "several"   CARDIAC CATHETERIZATION N/A 12/13/2014   Procedure: Left Heart Cath and Coronary Angiography;  Surgeon: Corky Crafts, MD;  Location[facility] INVASIVE CV LAB;  Service: Cardiovascular;  Laterality: N/A;   CARDIAC CATHETERIZATION  12/13/2014   Procedure: Coronary Stent Intervention;  Surgeon: Corky Crafts, MD;  Location: [facility] INVASIVE CV LAB;  Service: Cardiovascular;;   CARDIAC CATHETERIZATION N/A 10/07/2015   Procedure: Left Heart Cath and Cors/Grafts Angiography;  Surgeon: Kathleene Hazel, MD;  Location: [facility] INVASIVE CV LAB;  Service: Cardiovascular;  Laterality: N/A;   CARDIAC CATHETERIZATION N/A 10/07/2015   Procedure: Coronary Stent Intervention;  Surgeon: Kathleene Hazel, MD;  Location: [facility] INVASIVE CV LAB;  Service: Cardiovascular;  Laterality: N/A;   CARDIOVERSION N/A 05/25/2022   Procedure: CARDIOVERSION;  Surgeon: Thomasene Ripple, DO;  Location: [facility] ENDOSCOPY;  Service: Cardiovascular;  Laterality: N/A;   CATARACT EXTRACTION W/PHACO Left 10/21/2021   Procedure: CATARACT EXTRACTION PHACO AND INTRAOCULAR LENS PLACEMENT (IOC) LEFT 3.94 00:33.7;  Surgeon: Nevada Crane, MD;  Location: [facility] SURGERY CNTR;  Service: Ophthalmology;  Laterality: Left;   CATARACT EXTRACTION W/PHACO Right 11/09/2021   Procedure: CATARACT EXTRACTION PHACO AND INTRAOCULAR LENS PLACEMENT (IOC) RIGHT;  Surgeon: Nevada Crane, MD;  Location:  [facility];  Service: Ophthalmology;  Laterality: Right;  3.59 0:29.4   CORONARY ANGIOPLASTY  "several"   CORONARY ANGIOPLASTY WITH STENT PLACEMENT  2005; 12/13/2014   "2; 1"   CORONARY ARTERY BYPASS GRAFT  05/31/1994   CABG X5   CORONARY ARTERY BYPASS GRAFT  07/29/2005   CABG X3   DIALYSIS/PERMA CATHETER INSERTION N/A 09/10/2022   Procedure: DIALYSIS/PERMA CATHETER INSERTION;  Surgeon: Renford Dills, MD;  Location: [facility] INVASIVE CV LAB;  Service: Cardiovascular;  Laterality: N/A;   ESOPHAGOGASTRODUODENOSCOPY (EGD) WITH ESOPHAGEAL DILATION  05/31/1998   GREEN LIGHT LASER TURP (TRANSURETHRAL RESECTION OF PROSTATE  01/30/1999   "not cancerous"   HERNIA REPAIR     LAPAROSCOPIC CHOLECYSTECTOMY     LEFT HEART CATH AND CORS/GRAFTS ANGIOGRAPHY N/A 07/28/2017   Procedure: LEFT HEART CATH AND CORS/GRAFTS ANGIOGRAPHY;  Surgeon: Lennette Bihari,  MD;  Location: [facility] INVASIVE CV LAB;  Service: Cardiovascular;  Laterality: N/A;   LUNG SURGERY  05/31/1994   "S/P CABG, had to put staple in lung after it had collapsed"   MINOR REMOVAL OF PERITONEAL DIALYSIS CATHETER  10/2022   UMBILICAL HERNIA REPAIR     w/chole   Family History  Problem Relation Age of Onset   Heart attack Mother        MI   Stroke Mother    Heart disease Mother    Hypertension Mother    Hyperlipidemia Mother    Asthma Mother    Heart disease Father    Rheumatic fever Father    Colon cancer Neg Hx    Social History   Socioeconomic History   Marital status: Married    Spouse name: Kysen Wolsky   Number of children: 2   Years of education: Not on file   Highest education level: 8th grade  Occupational History   Occupation: Retired  Tobacco Use   Smoking status: Former    Current packs/day: 0.00    Average packs/day: 3.0 packs/day for 20.0 years (60.0 ttl pk-yrs)    Types: Cigarettes    Start date: 07/25/1966    Quit date: 07/25/1986    Years since quitting: 36.9   Smokeless tobacco: Never  Vaping Use   Vaping status: Never Used  Substance and Sexual Activity   Alcohol use: No    Alcohol/week: 0.0 standard drinks of alcohol   Drug use: No   Sexual activity: Not Currently  Other Topics Concern   Not on file  Social History Narrative   Did auto salvage work.   Lives at home with his wife.  Independent at baseline.   Social Drivers of Corporate investment banker Strain: Low Risk  (06/23/2023)   Overall Financial Resource Strain (CARDIA)    Difficulty of Paying Living Expenses: Not hard at all  Food Insecurity: No Food Insecurity (06/23/2023)   Hunger Vital Sign    Worried About Running Out of Food in the Last Year: Never true    Ran Out of Food in the Last Year: Never true  Transportation Needs: No Transportation Needs (06/23/2023)   PRAPARE - Administrator, Civil Service (Medical): No    Lack of Transportation (Non-Medical): No   Physical Activity: Inactive (06/23/2023)   Exercise Vital Sign    Days of Exercise per Week: 0 days    Minutes of Exercise per Session: 0 min  Stress: No Stress Concern Present (06/23/2023)   [name] of Occupational Health - Occupational Stress Questionnaire    Feeling of  Stress : Only a little  Social Connections: Moderately Isolated (06/23/2023)   Social Connection and Isolation Panel [NHANES]    Frequency of Communication with Friends and Family: Twice a week    Frequency of Social Gatherings with Friends and Family: Three times a week    Attends Religious Services: Never    Active Member of Clubs or Organizations: No    Attends Engineer, structural: Never    Marital Status: Married    Tobacco Counseling Counseling given: Not Answered   Clinical Intake:  Pre-visit preparation completed: Yes  Pain : 0-10 Pain Score: 5  Pain Type: Acute pain (3 weeks) Pain Location: Abdomen Pain Descriptors / Indicators: Aching     BMI - recorded: 29.53 Nutritional Status: BMI 25 -29 Overweight Nutritional Risks: Nausea/ vomitting/ diarrhea Diabetes: No  How often do you need to have someone help you when you read instructions, pamphlets, or other written materials from your doctor or pharmacy?: 1 - Never  Interpreter Needed?: No  Information entered by :: Tora Kindred, CMA   Activities of Daily Living    06/23/2023    3:09 PM 06/22/2023   11:39 AM  In your present state of health, do you have any difficulty performing the following activities:  Hearing? 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 0 0  Difficulty concentrating or making decisions? 1 1  Walking or climbing stairs? 1 1  Comment doesn't use cane or walker   Dressing or bathing? 1 1  Comment wife assists   Doing errands, shopping? 1 1  Comment wife drives patient to appointments   Preparing Food and eating ? N N  Using the Toilet? N N  In the past six months, have you  accidently leaked urine? N N  Do you have problems with loss of bowel control? N N  Managing your Medications? Malvin Johns  Comment wife fills pill box   Managing your Finances? Y N  Comment wife Chief Strategy Officer or managing your Housekeeping? Malvin Johns  Comment wife does     Patient Care Team: Reubin Milan, MD as PCP - General (Internal Medicine) Kathleene Hazel, MD as PCP - Cardiology (Cardiology) Mosetta Pigeon, MD as Consulting Physician (Nephrology) Nevada Crane, MD as Consulting Physician (Ophthalmology) Vernie Murders, MD (Otolaryngology) Gaspar Cola, [facility] (Inactive) (Pharmacist)  Indicate any recent Medical Services you may have received from other than [facility] providers in the past year (date may be approximate).     Assessment:   This is a routine wellness examination for Trejuan.  Hearing/Vision screen Hearing Screening - Comments:: Deaf in left ear, no hearing loss in right ear Vision Screening - Comments:: Gets eye exam, Dr. Willey Blade @ [facility]   Goals Addressed             This Visit's Progress    Patient Stated       Eat healthier      Depression Screen    06/23/2023    3:18 PM 05/27/2023    1:45 PM 09/24/2022    2:03 PM 07/26/2022    1:47 PM 06/25/2022    1:37 PM 04/06/2022    3:38 PM 10/28/2021   11:01 AM  PHQ 2/9 Scores  PHQ - 2 Score 0 2 0 4 1 3  0  PHQ- 9 Score 3 9 11 13  16      Fall Risk    06/23/2023  3:28 PM 06/22/2023   11:39 AM 05/27/2023    1:45 PM 09/24/2022    2:03 PM 07/26/2022    1:49 PM  Fall Risk   Falls in the past year? 1 1 0 1 1  Number falls in past yr: 0 0 0 1 1  Injury with Fall? 1 1 0 1 1  Risk for fall due to : History of fall(s);Impaired balance/gait;Impaired mobility;Orthopedic patient  No Fall Risks History of fall(s);Impaired balance/gait;Orthopedic patient History of fall(s);Impaired balance/gait  Follow up Falls prevention discussed;Education provided;Falls evaluation completed  Falls  evaluation completed Falls evaluation completed Falls evaluation completed    MEDICARE RISK AT HOME: Medicare Risk at Home Any stairs in or around the home?: Yes If so, are there any without handrails?: No Home free of loose throw rugs in walkways, pet beds, electrical cords, etc?: Yes Adequate lighting in your home to reduce risk of falls?: Yes Life alert?: No Use of a cane, walker or w/c?: No Grab bars in the bathroom?: No Shower chair or bench in shower?: Yes Elevated toilet seat or a handicapped toilet?: No  TIMED UP AND GO:  Was the test performed?  No    Cognitive Function:        06/23/2023    3:29 PM 06/25/2022    1:40 PM 04/06/2022    3:33 PM 04/17/2018    3:05 PM 04/16/2016    3:06 PM  6CIT Screen  What Year? 0 points 0 points 0 points 0 points 0 points  What month? 0 points 0 points 0 points 0 points 0 points  What time? 0 points 0 points 0 points 0 points 0 points  Count back from 20 4 points 0 points 0 points 0 points 0 points  Months in reverse 4 points 0 points 4 points 4 points 4 points  Repeat phrase 4 points 2 points 2 points 2 points 10 points  Total Score 12 points 2 points 6 points 6 points 14 points    Immunizations Immunization History  Administered Date(s) Administered   Fluad Quad(high Dose 65+) 03/01/2019, 03/19/2020, 02/25/2021, 04/06/2022   Influenza, High Dose Seasonal PF 03/18/2015, 03/02/2016, 03/15/2017, 02/13/2018   Influenza-Unspecified 03/14/2017   Moderna Sars-Covid-2 Vaccination 07/28/2019, 08/25/2019   Pneumococcal Conjugate-13 12/25/2013   Pneumococcal Polysaccharide-23 03/04/2009   Td 10/10/2003, 04/16/2016   Tdap 01/22/2022    TDAP status: Up to date  Flu Vaccine status: Up to date  Pneumococcal vaccine status: Up to date  Covid-19 vaccine status: Declined, Education has been provided regarding the importance of this vaccine but patient still declined. Advised may receive this vaccine at local pharmacy or Health Dept.or  vaccine clinic. Aware to provide a copy of the vaccination record if obtained from local pharmacy or Health Dept. Verbalized acceptance and understanding.  Qualifies for Shingles Vaccine? Yes   Zostavax completed No   Shingrix Completed?: No.    Education has been provided regarding the importance of this vaccine. Patient has been advised to call insurance company to determine out of pocket expense if they have not yet received this vaccine. Advised may also receive vaccine at local pharmacy or Health Dept. Verbalized acceptance and understanding. Patient declined  Screening Tests Health Maintenance  Topic Date Due   COVID-19 Vaccine (3 - Moderna risk series) 09/22/2019   HEMOGLOBIN A1C  03/08/2023   Zoster Vaccines- Shingrix (1 of 2) 08/25/2023 (Originally 12/11/1964)   INFLUENZA VACCINE  08/29/2023 (Originally 12/30/2022)   FOOT EXAM  09/24/2023   OPHTHALMOLOGY EXAM  02/08/2024   Medicare Annual Wellness (AWV)  06/22/2024   DTaP/Tdap/Td (4 - Td or Tdap) 01/23/2032   Pneumonia Vaccine 19+ Years old  Completed   Hepatitis C Screening  Completed   HPV VACCINES  Aged Out   Colonoscopy  Discontinued    Health Maintenance  Health Maintenance Due  Topic Date Due   COVID-19 Vaccine (3 - Moderna risk series) 09/22/2019   HEMOGLOBIN A1C  03/08/2023    Colorectal cancer screening: No longer required.   Lung Cancer Screening: (Low Dose CT Chest recommended if Age 76-80 years, 20 pack-year currently smoking OR have quit w/in 15years.) does not qualify.   Lung Cancer Screening Referral: n/a  Additional Screening:  Hepatitis C Screening: does not qualify; Completed 01/13/17  Vision Screening: Recommended annual ophthalmology exams for early detection of glaucoma and other disorders of the eye. Is the patient up to date with their annual eye exam?  Yes  Who is the provider or what is the name of the office in which the patient attends annual eye exams? Dr. Willey Blade @ [facility] If pt  is not established with a provider, would they like to be referred to a provider to establish care? No .   Dental Screening: Recommended annual dental exams for proper oral hygiene  Diabetic Foot Exam: Diabetic Foot Exam: Completed 09/24/22  Community Resource Referral / Chronic Care Management: CRR required this visit?  No   CCM required this visit?  No     Plan:     I have personally reviewed and noted the following in the patient's chart:   Medical and social history Use of alcohol, tobacco or illicit drugs  Current medications and supplements including opioid prescriptions. Patient is not currently taking opioid prescriptions. Functional ability and status Nutritional status Physical activity Advanced directives List of other physicians Hospitalizations, surgeries, and ER visits in previous 12 months Vitals Screenings to include cognitive, depression, and falls Referrals and appointments  In addition, I have reviewed and discussed with patient certain preventive protocols, quality metrics, and best practice recommendations. A written personalized care plan for preventive services as well as general preventive health recommendations were provided to patient.     Tora Kindred, CMA   06/23/2023   After Visit Summary: (MyChart) Due to this being a telephonic visit, the after visit summary with patients personalized plan was offered to patient via MyChart   Nurse Notes:  Patient's wife, Rinaldo Cloud, was present during today's visit 6 CIT Score - 12 Declined covid and shingles vaccines

## 2023-06-23 NOTE — Patient Instructions (Addendum)
Ronald [name] , Thank you for taking time to come for your Medicare Wellness Visit. I appreciate your ongoing commitment to your health goals. Please review the following plan we discussed and let me know if I can assist you in the future.   Referrals/Orders/Follow-Ups/Clinician Recommendations: none  This is a list of the screening recommended for you and due dates:  Health Maintenance  Topic Date Due   COVID-19 Vaccine (3 - Moderna risk series) 09/22/2019   Hemoglobin A1C  03/08/2023   Zoster (Shingles) Vaccine (1 of 2) 08/25/2023*   Complete foot exam   09/24/2023   Eye exam for diabetics  02/08/2024   Medicare Annual Wellness Visit  06/22/2024   DTaP/Tdap/Td vaccine (4 - Td or Tdap) 01/23/2032   Pneumonia Vaccine  Completed   Flu Shot  Completed   Hepatitis C Screening  Completed   HPV Vaccine  Aged Out   Colon Cancer Screening  Discontinued  *Topic was postponed. The date shown is not the original due date.    Advanced directives: (ACP Link)Information on Advanced Care Planning can be found at [facility] [place] Advance Health Care Directives Advance Health Care Directives ([email or web address]   Once you have complete the forms, please bring a copy of your health care power of attorney and living will to the office to be added to your chart at your convenience.   Next Medicare Annual Wellness Visit scheduled for next year: Yes, 07/05/24 @ 3:10pm (video visit)  Fall Prevention in the Home, Adult Falls can cause injuries and affect people of all ages. There are many simple things that you can do to make your home safe and to help prevent falls. If you need it, ask for help making these changes. What actions can I take to prevent falls? General information Use good lighting in all rooms. Make sure to: Replace any light bulbs that burn out. Turn on lights if it is dark and use night-lights. Keep items that you use often in easy-to-reach places. Lower the shelves around your  home if needed. Move furniture so that there are clear paths around it. Do not keep throw rugs or other things on the floor that can make you trip. If any of your floors are uneven, fix them. Add color or contrast paint or tape to clearly mark and help you see: Grab bars or handrails. First and last steps of staircases. Where the edge of each step is. If you use a ladder or stepladder: Make sure that it is fully opened. Do not climb a closed ladder. Make sure the sides of the ladder are locked in place. Have someone hold the ladder while you use it. Know where your pets are as you move through your home. What can I do in the bathroom?     Keep the floor dry. Clean up any water that is on the floor right away. Remove soap buildup in the bathtub or shower. Buildup makes bathtubs and showers slippery. Use non-skid mats or decals on the floor of the bathtub or shower. Attach bath mats securely with double-sided, non-slip rug tape. If you need to sit down while you are in the shower, use a non-slip stool. Install grab bars by the toilet and in the bathtub and shower. Do not use towel bars as grab bars. What can I do in the bedroom? Make sure that you have a light by your bed that is easy to reach. Do not use any sheets or blankets on your  bed that hang to the floor. Have a firm bench or chair with side arms that you can use for support when you get dressed. What can I do in the kitchen? Clean up any spills right away. If you need to reach something above you, use a sturdy step stool that has a grab bar. Keep electrical cables out of the way. Do not use floor polish or wax that makes floors slippery. What can I do with my stairs? Do not leave anything on the stairs. Make sure that you have a light switch at the top and the bottom of the stairs. Have them installed if you do not have them. Make sure that there are handrails on both sides of the stairs. Fix handrails that are broken or  loose. Make sure that handrails are as long as the staircases. Install non-slip stair treads on all stairs in your home if they do not have carpet. Avoid having throw rugs at the top or bottom of stairs, or secure the rugs with carpet tape to prevent them from moving. Choose a carpet design that does not hide the edge of steps on the stairs. Make sure that carpet is firmly attached to the stairs. Fix any carpet that is loose or worn. What can I do on the outside of my home? Use bright outdoor lighting. Repair the edges of walkways and driveways and fix any cracks. Clear paths of anything that can make you trip, such as tools or rocks. Add color or contrast paint or tape to clearly mark and help you see high doorway thresholds. Trim any bushes or trees on the main path into your home. Check that handrails are securely fastened and in good repair. Both sides of all steps should have handrails. Install guardrails along the edges of any raised decks or porches. Have leaves, snow, and ice cleared regularly. Use sand, salt, or ice melt on walkways during winter months if you live where there is ice and snow. In the garage, clean up any spills right away, including grease or oil spills. What other actions can I take? Review your medicines with your health care provider. Some medicines can make you confused or feel dizzy. This can increase your chance of falling. Wear closed-toe shoes that fit well and support your feet. Wear shoes that have rubber soles and low heels. Use a cane, walker, scooter, or crutches that help you move around if needed. Talk with your provider about other ways that you can decrease your risk of falls. This may include seeing a physical therapist to learn to do exercises to improve movement and strength. Where to find more information Centers for Disease Control and Prevention, STEADI: TonerPromos.no [employer] on Aging: BaseRingTones.pl National Institute on Aging:  BaseRingTones.pl Contact a health care provider if: You are afraid of falling at home. You feel weak, drowsy, or dizzy at home. You fall at home. Get help right away if you: Lose consciousness or have trouble moving after a fall. Have a fall that causes a head injury. These symptoms may be an emergency. Get help right away. Call 911. Do not wait to see if the symptoms will go away. Do not drive yourself to the hospital. This information is not intended to replace advice given to you by your health care provider. Make sure you discuss any questions you have with your health care provider. Document Revised: 01/18/2022 Document Reviewed: 01/18/2022 Elsevier Patient Education  2024 [employer].

## 2023-06-24 DIAGNOSIS — Z992 Dependence on renal dialysis: Secondary | ICD-10-CM | POA: Diagnosis not present

## 2023-06-24 DIAGNOSIS — N186 End stage renal disease: Secondary | ICD-10-CM | POA: Diagnosis not present

## 2023-06-27 DIAGNOSIS — Z992 Dependence on renal dialysis: Secondary | ICD-10-CM | POA: Diagnosis not present

## 2023-06-27 DIAGNOSIS — N186 End stage renal disease: Secondary | ICD-10-CM | POA: Diagnosis not present

## 2023-06-28 DIAGNOSIS — L570 Actinic keratosis: Secondary | ICD-10-CM | POA: Diagnosis not present

## 2023-06-28 DIAGNOSIS — D485 Neoplasm of uncertain behavior of skin: Secondary | ICD-10-CM | POA: Diagnosis not present

## 2023-06-28 DIAGNOSIS — C4401 Basal cell carcinoma of skin of lip: Secondary | ICD-10-CM | POA: Diagnosis not present

## 2023-06-29 DIAGNOSIS — N186 End stage renal disease: Secondary | ICD-10-CM | POA: Diagnosis not present

## 2023-06-29 DIAGNOSIS — Z992 Dependence on renal dialysis: Secondary | ICD-10-CM | POA: Diagnosis not present

## 2023-07-01 DIAGNOSIS — N186 End stage renal disease: Secondary | ICD-10-CM | POA: Diagnosis not present

## 2023-07-01 DIAGNOSIS — Z992 Dependence on renal dialysis: Secondary | ICD-10-CM | POA: Diagnosis not present

## 2023-07-04 DIAGNOSIS — Z992 Dependence on renal dialysis: Secondary | ICD-10-CM | POA: Diagnosis not present

## 2023-07-04 DIAGNOSIS — N186 End stage renal disease: Secondary | ICD-10-CM | POA: Diagnosis not present

## 2023-07-06 DIAGNOSIS — Z992 Dependence on renal dialysis: Secondary | ICD-10-CM | POA: Diagnosis not present

## 2023-07-06 DIAGNOSIS — N186 End stage renal disease: Secondary | ICD-10-CM | POA: Diagnosis not present

## 2023-07-07 DIAGNOSIS — C4401 Basal cell carcinoma of skin of lip: Secondary | ICD-10-CM | POA: Diagnosis not present

## 2023-07-08 ENCOUNTER — Other Ambulatory Visit: Payer: Self-pay | Admitting: Internal Medicine

## 2023-07-08 DIAGNOSIS — N186 End stage renal disease: Secondary | ICD-10-CM | POA: Diagnosis not present

## 2023-07-08 DIAGNOSIS — Z992 Dependence on renal dialysis: Secondary | ICD-10-CM | POA: Diagnosis not present

## 2023-07-08 DIAGNOSIS — M109 Gout, unspecified: Secondary | ICD-10-CM

## 2023-07-08 NOTE — Telephone Encounter (Signed)
 Requested Prescriptions  Pending Prescriptions Disp Refills   allopurinol  (ZYLOPRIM ) 100 MG tablet [Pharmacy Med Name: ALLOPURINOL  100 MG TABLET] 90 tablet 1    Sig: TAKE 1 TABLET BY MOUTH EVERYDAY AT BEDTIME     Endocrinology:  Gout Agents - allopurinol  Failed - 07/08/2023  2:25 PM      Failed - Uric Acid in normal range and within 360 days    No results found for: POCURA, LABURIC       Failed - Cr in normal range and within 360 days    Creat  Date Value Ref Range Status  11/24/2015 2.44 (H) 0.70 - 1.18 mg/dL Final    Comment:      For patients > or = 80 years of age: The upper reference limit for Creatinine is approximately 13% higher for people identified as African-American.      Creatinine, Ser  Date Value Ref Range Status  05/31/2023 6.58 (H) 0.61 - 1.24 mg/dL Final         Passed - Valid encounter within last 12 months    Recent Outpatient Visits           1 month ago Community acquired pneumonia, unspecified laterality   [place] [facility], Leita DEL, MD   1 month ago Acute sinusitis, recurrence not specified, unspecified location   [facility], Selinda PARAS, MD   7 months ago Muscle tension headache   [place] [facility], Leita DEL, MD   9 months ago Essential hypertension   [place] [facility] at [facility], Leita DEL, MD   11 months ago Prediabetes    [facility] at [facility], Leita DEL, MD              Passed - CBC within normal limits and completed in the last 12 months    WBC  Date Value Ref Range Status  05/31/2023 5.8 4.0 - 10.5 K/uL Final   RBC  Date Value Ref Range Status  05/31/2023 3.72 (L) 4.22 - 5.81 MIL/uL Final   Hemoglobin  Date Value Ref Range Status  05/31/2023 11.6 (L) 13.0 - 17.0 g/dL Final   87/79/7976 9.9 (L) 13.0 - 17.7 g/dL Final   HCT  Date Value Ref Range Status  05/31/2023 34.9 (L) 39.0 - 52.0 % Final   Hematocrit  Date Value Ref Range Status  05/19/2022 30.4 (L) 37.5 - 51.0 % Final   MCHC  Date Value Ref Range Status  05/31/2023 33.2 30.0 - 36.0 g/dL Final   [facility]  Date Value Ref Range Status  05/31/2023 31.2 26.0 - 34.0 pg Final   MCV  Date Value Ref Range Status  05/31/2023 93.8 80.0 - 100.0 fL Final  05/19/2022 88 79 - 97 fL Final   No results found for: PLTCOUNTKUC, LABPLAT, POCPLA RDW  Date Value Ref Range Status  05/31/2023 15.1 11.5 - 15.5 % Final  05/19/2022 14.8 11.6 - 15.4 % Final

## 2023-07-11 DIAGNOSIS — Z992 Dependence on renal dialysis: Secondary | ICD-10-CM | POA: Diagnosis not present

## 2023-07-11 DIAGNOSIS — N186 End stage renal disease: Secondary | ICD-10-CM | POA: Diagnosis not present

## 2023-07-13 ENCOUNTER — Other Ambulatory Visit (INDEPENDENT_AMBULATORY_CARE_PROVIDER_SITE_OTHER): Payer: Self-pay | Admitting: Vascular Surgery

## 2023-07-13 DIAGNOSIS — I739 Peripheral vascular disease, unspecified: Secondary | ICD-10-CM

## 2023-07-13 DIAGNOSIS — I714 Abdominal aortic aneurysm, without rupture, unspecified: Secondary | ICD-10-CM

## 2023-07-13 DIAGNOSIS — N186 End stage renal disease: Secondary | ICD-10-CM | POA: Diagnosis not present

## 2023-07-13 DIAGNOSIS — Z992 Dependence on renal dialysis: Secondary | ICD-10-CM | POA: Diagnosis not present

## 2023-07-13 NOTE — Progress Notes (Signed)
MRN : [id number]  Ronald [name] is a 78 y.o. (1946/05/19) male who presents with chief complaint of check access.  History of Present Illness:    The patient is seen for evaluation of dialysis access.  Current access is via a catheter which was placed 09/10/2022.  The catheter is functioning adequately the flow rates have been okay.  There have not been multiple episodes of catheter infection.  The patient denies fever and chills while on dialysis.  No tenderness or drainage at the exit site.  The patient is also followed for an AAA.  Patient denies new onset of abdominal pain or unusual back pain, no other abdominal complaints. No changes suggesting embolic episodes.   CT scan of the abd and pelvis dated 09/13/2022 shows an AAA 3.1 cm  He is also followed for ASO with claudication.  No recent shortening of the patient's walking distance or new symptoms consistent with claudication.  No history of rest pain symptoms. No new ulcers or wounds of the lower extremities have occurred.  There have been no significant interval changes in the patient's overall health care since his last visit.  The patient denies amaurosis fugax or recent TIA symptoms. There are no recent neurological changes noted. There is no history of DVT, PE or superficial thrombophlebitis. No recent episodes of angina or shortness of breath documented.   Duplex US of the aorta and iliac arteries shows an AAA measured 2.6 cm.  Duplex ultrasound of the AV access left brachiocephalic fistula done today demonstrates uniform flow with a flow volume of 1495 cc/min  No outpatient medications have been marked as taking for the 07/14/23 encounter (Appointment) with Gilda Crease, Latina Craver, MD.    Past Medical History:  Diagnosis Date   AAA (abdominal aortic aneurysm) (HCC)    a. 3cm by [place] 2015.   Acute on chronic combined systolic (congestive) and diastolic (congestive) heart failure (HCC) 05/28/2022    Arthritis    "hips; back" (12/13/2014)   CAD (coronary artery disease) 2007   a. s/p CABG- IMA-LAD, VG-Cx, VG-RCA, VG-diag in 1999. B. sp redo CABG- VG-OM, VG-RCA in 2007 due to VG disease. c. NSTEMI 11/2014 s/p DES to SVG-OM from the Y graft.d. PTCA/DES x 1 distal body of SVG to Diagonal.09/2015   Chronic combined systolic and diastolic CHF (congestive heart failure) (HCC)    a. remote EF 40-45% in 2006. b. Normal EF 2014. b. Echo 07/2016 EF 45-50%, grade 1 DD. c. Echo 2020 30% to 35%. Diffuse EF 30-35%, diffuse hypokinesis.   Chronic lower back pain    CKD (chronic kidney disease), stage IV (HCC)    COPD (chronic obstructive pulmonary disease) (HCC)    Deafness in left ear    Degenerative disc disease, lumbar    Diabetes mellitus, type 2 (HCC) 10/04/2014   Microalbumin 05/11/2012-100. Foot exam/monofilament 05/11/2012-normal.   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 noted on CT 2008.   Type II diabetes mellitus (HCC)    Diet control    Unstable angina (HCC) 07/27/2017   Walking pneumonia 1990's   Wears dentures    full upper    Past Surgical History:  Procedure Laterality Date   CARDIAC CATHETERIZATION  "several"   CARDIAC CATHETERIZATION N/A 12/13/2014   Procedure: Left Heart Cath and Coronary Angiography;  Surgeon: Corky Crafts, MD;  Location: [facility] INVASIVE CV LAB;  Service: Cardiovascular;  Laterality: N/A;   CARDIAC CATHETERIZATION  12/13/2014    Procedure: Coronary Stent Intervention;  Surgeon: Corky Crafts, MD;  Location: [facility] INVASIVE CV LAB;  Service: Cardiovascular;;   CARDIAC CATHETERIZATION N/A 10/07/2015   Procedure: Left Heart Cath and Cors/Grafts Angiography;  Surgeon: Kathleene Hazel, MD;  Location: [facility] INVASIVE CV LAB;  Service: Cardiovascular;  Laterality: N/A;   CARDIAC CATHETERIZATION N/A 10/07/2015   Procedure: Coronary Stent Intervention;  Surgeon: Kathleene Hazel, MD;  Location: [facility] INVASIVE CV LAB;  Service: Cardiovascular;  Laterality: N/A;   CARDIOVERSION N/A 05/25/2022   Procedure: CARDIOVERSION;  Surgeon: Thomasene Ripple, DO;  Location: [facility] ENDOSCOPY;  Service: Cardiovascular;  Laterality: N/A;   CATARACT EXTRACTION W/PHACO Left 10/21/2021   Procedure: CATARACT EXTRACTION PHACO AND INTRAOCULAR LENS PLACEMENT (IOC) LEFT 3.94 00:33.7;  Surgeon: Nevada Crane, MD;  Location: [facility] SURGERY CNTR;  Service: Ophthalmology;  Laterality: Left;   CATARACT EXTRACTION W/PHACO Right 11/09/2021   Procedure: CATARACT EXTRACTION PHACO AND INTRAOCULAR LENS PLACEMENT (IOC) RIGHT;  Surgeon: Nevada Crane, MD;  Location: [facility]GERY [facility];  Service: Ophthalmology;  Laterality: Right;  3.59 0:29.4   CORONARY ANGIOPLASTY  "several"   CORONARY ANGIOPLASTY WITH STENT PLACEMENT  2005; 12/13/2014   "2; 1"   CORONARY ARTERY BYPASS GRAFT  05/31/1994   CABG X5   CORONARY ARTERY BYPASS GRAFT  07/29/2005   CABG X3   DIALYSIS/PERMA CATHETER INSERTION N/A 09/10/2022   Procedure: DIALYSIS/PERMA CATHETER INSERTION;  Surgeon: Renford Dills, MD;  Location: [facility] INVASIVE CV LAB;  Service: Cardiovascular;  Laterality: N/A;   ESOPHAGOGASTRODUODENOSCOPY (EGD) WITH ESOPHAGEAL DILATION  05/31/1998   GREEN LIGHT LASER TURP (TRANSURETHRAL RESECTION OF PROSTATE  01/30/1999   "not cancerous"   HERNIA REPAIR     LAPAROSCOPIC CHOLECYSTECTOMY     LEFT HEART CATH AND CORS/GRAFTS ANGIOGRAPHY N/A 07/28/2017   Procedure: LEFT HEART  CATH AND CORS/GRAFTS ANGIOGRAPHY;  Surgeon: Lennette Bihari, MD;  Location: [facility] INVASIVE CV LAB;  Service: Cardiovascular;  Laterality: N/A;   LUNG SURGERY  05/31/1994   "S/P CABG, had to put staple in lung after it had collapsed"   MINOR REMOVAL OF PERITONEAL DIALYSIS CATHETER  10/2022   UMBILICAL HERNIA REPAIR     w/chole    Social History Social History   Tobacco Use   Smoking status: Former    Current packs/day: 0.00    Average packs/day: 3.0 packs/day for 20.0 years (60.0 ttl pk-yrs)    Types: Cigarettes    Start date: 07/25/1966    Quit date: 07/25/1986    Years since quitting: 36.9   Smokeless tobacco: Never  Vaping Use   Vaping status: Never Used  Substance Use Topics   Alcohol use: No    Alcohol/week: 0.0 standard drinks of alcohol   Drug use: No    Family History Family History  Problem Relation Age of Onset   Heart attack Mother  MI   Stroke Mother    Heart disease Mother    Hypertension Mother    Hyperlipidemia Mother    Asthma Mother    Heart disease Father    Rheumatic fever Father    Colon cancer Neg Hx     Allergies  Allergen Reactions   Predicort [Prednisolone] Other (See Comments)    Stomach pain   Ciprofloxacin Other (See Comments)    GI upset   Hydrochlorothiazide Other (See Comments)    Dehydration   Hydrocodone Nausea Only and Other (See Comments)    Stomach upset    Hydrocodone-Acetaminophen Nausea Only    Stomach upset   Sulfa Antibiotics Other (See Comments)    Cannot recall   Penicillins Hives, Rash and Other (See Comments)    Has patient had a PCN reaction causing immediate rash, facial/tongue/throat swelling, SOB or lightheadedness with hypotension: YES  Has patient had a PCN reaction causing severe rash involving mucus membranes or skin necrosis: NO  Has patient had a PCN reaction that required hospitalization NO  Has patient had a PCN reaction occurring within the last 10 years:NO  If all of the above answers are  "NO", then may proceed with Cephalosporin use.  Has patient had a PCN reaction causing immediate rash, facial/tongue/throat swelling, SOB or lightheadedness with hypotension: YES Has patient had a PCN reaction causing severe rash involving mucus membranes or skin necrosis: NO Has patient had a PCN reaction that required hospitalization NO Has patient had a PCN reaction occurring within the last 10 years:NO If all of the above answers are "NO", then may proceed with Cephalosporin use.     REVIEW OF SYSTEMS (Negative unless checked)  Constitutional: [] Weight loss  [] Fever  [] Chills Cardiac: [] Chest pain   [] Chest pressure   [] Palpitations   [] Shortness of breath when laying flat   [] Shortness of breath with exertion. Vascular:  [] Pain in legs with walking   [] Pain in legs at rest  [] History of DVT   [] Phlebitis   [] Swelling in legs   [] Varicose veins   [] Non-healing ulcers Pulmonary:   [] Uses home oxygen   [] Productive cough   [] Hemoptysis   [] Wheeze  [] COPD   [] Asthma Neurologic:  [] Dizziness   [] Seizures   [] History of stroke   [] History of TIA  [] Aphasia   [] Vissual changes   [] Weakness or numbness in arm   [] Weakness or numbness in leg Musculoskeletal:   [] Joint swelling   [] Joint pain   [] Low back pain Hematologic:  [] Easy bruising  [] Easy bleeding   [] Hypercoagulable state   [] Anemic Gastrointestinal:  [] Diarrhea   [] Vomiting  [] Gastroesophageal reflux/heartburn   [] Difficulty swallowing. Genitourinary:  [x] Chronic kidney disease   [] Difficult urination  [] Frequent urination   [] Blood in urine Skin:  [] Rashes   [] Ulcers  Psychological:  [] History of anxiety   []  History of major depression.  Physical Examination  There were no vitals filed for this visit. There is no height or weight on file to calculate BMI. Gen: WD/WN, NAD Head: [place]/AT, No temporalis wasting.  Ear/Nose/Throat: Hearing grossly intact, nares w/o erythema or drainage Eyes: PER, EOMI, sclera nonicteric.  Neck: Supple, no  gross masses or lesions.  No JVD.  Pulmonary:  Good air movement, no audible wheezing, no use of accessory muscles.  Cardiac: RRR, precordium non-hyperdynamic. Vascular:   Left arm AV access good thrill good bruit nonpulsatile Vessel Right Left  Radial Palpable Palpable  Brachial Palpable Palpable  Gastrointestinal: soft, non-distended. No guarding/no peritoneal signs.  Musculoskeletal:  M/S 5/5 throughout.  No deformity.  Neurologic: CN 2-12 intact. Pain and light touch intact in extremities.  Symmetrical.  Speech is fluent. Motor exam as listed above. Psychiatric: Judgment intact, Mood & affect appropriate for pt's clinical situation. Dermatologic: No rashes or ulcers noted.  No changes consistent with cellulitis.   CBC Lab Results  Component Value Date   WBC 5.8 05/31/2023   HGB 11.6 (L) 05/31/2023   HCT 34.9 (L) 05/31/2023   MCV 93.8 05/31/2023   PLT 127 (L) 05/31/2023    BMET    Component Value Date/Time   NA 133 (L) 05/31/2023 1452   NA 143 09/06/2022 0000   K 3.9 05/31/2023 1452   CL 99 05/31/2023 1452   CO2 22 05/31/2023 1452   GLUCOSE 109 (H) 05/31/2023 1452   GLUCOSE 82 06/13/2006 1621   BUN 50 (H) 05/31/2023 1452   BUN 85 (A) 09/06/2022 0000   CREATININE 6.58 (H) 05/31/2023 1452   CREATININE 2.44 (H) 11/24/2015 1546   CALCIUM 8.4 (L) 05/31/2023 1452   GFRNONAA 8 (L) 05/31/2023 1452   GFRAA 28 (L) 03/20/2020 1038   CrCl cannot be calculated (Patient's most recent lab result is older than the maximum 21 days allowed.).  COAG Lab Results  Component Value Date   INR 1.6 (H) 07/15/2022   INR 1.64 06/12/2018   INR 0.95 06/10/2018    Radiology No results found.   Assessment/Plan 1. 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 The patient's dialysis center is not doing well with cannulation.  I am not sure I understand why.  On physical examination the vein is quite easy to palpate and  seems quite large.  Additionally the duplex ultrasound demonstrates excellent flow and no hemodynamically significant stenoses.  I will message Dr. Thedore Mins regarding future follow-up for the time being we will not plan any revision surgery.    The patient should have a duplex ultrasound of the dialysis access in 6 months.  The patient will follow-up with me in the office after each ultrasound   - VAS US DUPLEX DIALYSIS ACCESS (AVF, AVG); Future  2. Abdominal aortic aneurysm (AAA) without rupture, unspecified part (HCC) Recommend: No surgery or intervention is indicated at this time.  The patient has an asymptomatic abdominal aortic aneurysm that is less than 4 cm in maximal diameter.    I have reviewed the natural history of abdominal aortic aneurysm and the small risk of rupture for aneurysm less than 5 cm in size.  However, as these small aneurysms tend to enlarge over time, continued surveillance with ultrasound or CT scan is mandatory.   I have also discussed optimizing medical management with hypertension and lipid control and the negative effect that any tobacco products have on aneurysmal disease.  The patient is also encouraged to exercise a minimum of 30 minutes 4 times a week.   Should the patient develop new onset abdominal or back pain or signs of peripheral embolization they are instructed to seek medical attention immediately and to alert the physician providing care that they have an aneurysm.   The patient voices their understanding.  The patient will return in 24-36 months with an aortic duplex.  3. Atherosclerosis of native artery of both lower extremities with intermittent claudication (HCC)  Recommend:  The patient has evidence of atherosclerosis of the lower extremities with claudication.  The patient does not voice lifestyle limiting changes at this point in time.  Noninvasive studies do not suggest clinically significant change.  No invasive studies, angiography  or surgery at this time The patient should continue walking and begin a more formal exercise program.  The patient should continue antiplatelet therapy and aggressive treatment of the lipid abnormalities  No changes in the patient's medications at this time  Continued surveillance is indicated as atherosclerosis is likely to progress with time.    The patient will continue follow up with noninvasive studies as ordered.   4. Coronary artery disease involving native coronary artery of native heart with angina pectoris (HCC) Continue cardiac and antihypertensive medications as already ordered and reviewed, no changes at this time.  Continue statin as ordered and reviewed, no changes at this time  Nitrates PRN for chest pain  5. Essential hypertension Continue antihypertensive medications as already ordered, these medications have been reviewed and there are no changes at this time. d   [name], MD  07/13/2023 8:11 AM

## 2023-07-14 ENCOUNTER — Ambulatory Visit (INDEPENDENT_AMBULATORY_CARE_PROVIDER_SITE_OTHER): Payer: Medicare Other

## 2023-07-14 ENCOUNTER — Encounter (INDEPENDENT_AMBULATORY_CARE_PROVIDER_SITE_OTHER): Payer: Self-pay | Admitting: Vascular Surgery

## 2023-07-14 ENCOUNTER — Ambulatory Visit (INDEPENDENT_AMBULATORY_CARE_PROVIDER_SITE_OTHER): Payer: Medicare Other | Admitting: Vascular Surgery

## 2023-07-14 VITALS — BP 100/57 | HR 94 | Resp 16 | Wt 204.8 lb

## 2023-07-14 DIAGNOSIS — I714 Abdominal aortic aneurysm, without rupture, unspecified: Secondary | ICD-10-CM | POA: Diagnosis not present

## 2023-07-14 DIAGNOSIS — Z992 Dependence on renal dialysis: Secondary | ICD-10-CM

## 2023-07-14 DIAGNOSIS — I10 Essential (primary) hypertension: Secondary | ICD-10-CM | POA: Diagnosis not present

## 2023-07-14 DIAGNOSIS — N186 End stage renal disease: Secondary | ICD-10-CM | POA: Diagnosis not present

## 2023-07-14 DIAGNOSIS — I70213 Atherosclerosis of native arteries of extremities with intermittent claudication, bilateral legs: Secondary | ICD-10-CM | POA: Diagnosis not present

## 2023-07-14 DIAGNOSIS — I25119 Atherosclerotic heart disease of native coronary artery with unspecified angina pectoris: Secondary | ICD-10-CM

## 2023-07-14 DIAGNOSIS — I739 Peripheral vascular disease, unspecified: Secondary | ICD-10-CM

## 2023-07-14 LAB — VAS US ABI WITH/WO TBI
Left ABI: 1.22
Right ABI: >1

## 2023-07-15 ENCOUNTER — Other Ambulatory Visit: Payer: Self-pay | Admitting: Internal Medicine

## 2023-07-15 DIAGNOSIS — Z992 Dependence on renal dialysis: Secondary | ICD-10-CM | POA: Diagnosis not present

## 2023-07-15 DIAGNOSIS — I2510 Atherosclerotic heart disease of native coronary artery without angina pectoris: Secondary | ICD-10-CM

## 2023-07-15 DIAGNOSIS — N186 End stage renal disease: Secondary | ICD-10-CM | POA: Diagnosis not present

## 2023-07-15 NOTE — Telephone Encounter (Signed)
Patient called and spoke to his wife, advised the need for an appointment. She asked for afternoon on Tues or Thurs. Advised only available are morning, she agreed, scheduled for 07/19/23.

## 2023-07-15 NOTE — Telephone Encounter (Signed)
Requested Prescriptions  Pending Prescriptions Disp Refills   atorvastatin (LIPITOR) 40 MG tablet [Pharmacy Med Name: ATORVASTATIN 40 MG TABLET] 90 tablet 0    Sig: TAKE 1 TABLET BY MOUTH EVERYDAY AT BEDTIME     Cardiovascular:  Antilipid - Statins Failed - 07/15/2023  3:38 PM      Failed - Lipid Panel in normal range within the last 12 months    Cholesterol, Total  Date Value Ref Range Status  11/24/2021 153 100 - 199 mg/dL Final   Cholesterol  Date Value Ref Range Status  07/16/2022 109 0 - 200 mg/dL Final   LDL Chol Calc (NIH)  Date Value Ref Range Status  11/24/2021 82 0 - 99 mg/dL Final   LDL Cholesterol  Date Value Ref Range Status  07/16/2022 59 0 - 99 mg/dL Final    Comment:           Total Cholesterol/HDL:CHD Risk Coronary Heart Disease Risk Table                     Men   Women  1/2 Average Risk   3.4   3.3  Average Risk       5.0   4.4  2 X Average Risk   9.6   7.1  3 X Average Risk  23.4   11.0        Use the calculated Patient Ratio above and the CHD Risk Table to determine the patient's CHD Risk.        ATP III CLASSIFICATION (LDL):  <100     mg/dL   Optimal  161-096  mg/dL   Near or Above                    Optimal  130-159  mg/dL   Borderline  045-409  mg/dL   High  >811     mg/dL   Very High Performed at [facility], [address] [place], [place] [address]    HDL  Date Value Ref Range Status  07/16/2022 37 (L) >40 mg/dL Final  29/56/2130 60 >86 mg/dL Final   Triglycerides  Date Value Ref Range Status  07/16/2022 63 <150 mg/dL Final         Passed - Patient is not pregnant      Passed - Valid encounter within last 12 months    Recent Outpatient Visits           1 month ago Community acquired pneumonia, unspecified laterality   [place] [facility] at [facility], Nyoka Cowden, MD   1 month ago Acute sinusitis, recurrence not specified, unspecified location   [facility]  Medicine at [facility], Ocie Bob, MD   8 months ago Muscle tension headache   [place] [facility] at [facility], Nyoka Cowden, MD   9 months ago Essential hypertension   [place] [facility] at [facility], Nyoka Cowden, MD   11 months ago Prediabetes   [facility] Medicine at [facility], Nyoka Cowden, MD       Future Appointments             In 4 days Judithann Graves Nyoka Cowden, MD [facility] & Sports Medicine at [facility], [facility]

## 2023-07-16 ENCOUNTER — Other Ambulatory Visit: Payer: Self-pay | Admitting: Internal Medicine

## 2023-07-16 ENCOUNTER — Encounter (INDEPENDENT_AMBULATORY_CARE_PROVIDER_SITE_OTHER): Payer: Self-pay | Admitting: Vascular Surgery

## 2023-07-16 DIAGNOSIS — E039 Hypothyroidism, unspecified: Secondary | ICD-10-CM

## 2023-07-18 DIAGNOSIS — Z992 Dependence on renal dialysis: Secondary | ICD-10-CM | POA: Diagnosis not present

## 2023-07-18 DIAGNOSIS — N186 End stage renal disease: Secondary | ICD-10-CM | POA: Diagnosis not present

## 2023-07-18 NOTE — Telephone Encounter (Signed)
Requested medications are due for refill today.  yes  Requested medications are on the active medications list.  yes  Last refill. 04/22/2023 #90 0 rf  Future visit scheduled.   Yes - tomorrow  Notes to clinic.  Labs are expired.    Requested Prescriptions  Pending Prescriptions Disp Refills   levothyroxine (SYNTHROID) 75 MCG tablet [Pharmacy Med Name: LEVOTHYROXINE 75 MCG TABLET] 90 tablet 0    Sig: TAKE 1 TABLET BY MOUTH EVERY DAY BEFORE BREAKFAST     Endocrinology:  Hypothyroid Agents Failed - 07/18/2023 11:41 AM      Failed - TSH in normal range and within 360 days    TSH  Date Value Ref Range Status  05/28/2022 1.951 0.350 - 4.500 uIU/mL Final    Comment:    Performed by a 3rd Generation assay with a functional sensitivity of <=0.01 uIU/mL. Performed at [facility], [address] [place], [place] [address]   01/22/2021 2.680 0.450 - 4.500 uIU/mL Final         Passed - Valid encounter within last 12 months    Recent Outpatient Visits           1 month ago Community acquired pneumonia, unspecified laterality   [place] [facility] at [facility], Nyoka Cowden, MD   1 month ago Acute sinusitis, recurrence not specified, unspecified location   [facility] Medicine at [facility], Ocie Bob, MD   8 months ago Muscle tension headache   [place] [facility] Medicine at [facility], Nyoka Cowden, MD   9 months ago Essential hypertension   [place] [facility] Medicine at [facility], Nyoka Cowden, MD   11 months ago Prediabetes   [facility] Primary Care & [facility] Medicine at [facility], Nyoka Cowden, MD       Future Appointments             Tomorrow Judithann Graves Nyoka Cowden, MD [facility] Primary Care & Sports Medicine at [facility], [facility]

## 2023-07-19 ENCOUNTER — Encounter: Payer: Self-pay | Admitting: Internal Medicine

## 2023-07-19 ENCOUNTER — Ambulatory Visit (INDEPENDENT_AMBULATORY_CARE_PROVIDER_SITE_OTHER): Payer: Medicare Other | Admitting: Internal Medicine

## 2023-07-19 VITALS — BP 104/60 | HR 93 | Ht 69.0 in | Wt 203.8 lb

## 2023-07-19 DIAGNOSIS — E039 Hypothyroidism, unspecified: Secondary | ICD-10-CM

## 2023-07-19 DIAGNOSIS — N186 End stage renal disease: Secondary | ICD-10-CM | POA: Diagnosis not present

## 2023-07-19 DIAGNOSIS — Z23 Encounter for immunization: Secondary | ICD-10-CM | POA: Diagnosis not present

## 2023-07-19 DIAGNOSIS — I5020 Unspecified systolic (congestive) heart failure: Secondary | ICD-10-CM | POA: Diagnosis not present

## 2023-07-19 DIAGNOSIS — M109 Gout, unspecified: Secondary | ICD-10-CM | POA: Diagnosis not present

## 2023-07-19 DIAGNOSIS — E782 Mixed hyperlipidemia: Secondary | ICD-10-CM | POA: Diagnosis not present

## 2023-07-19 DIAGNOSIS — Z992 Dependence on renal dialysis: Secondary | ICD-10-CM | POA: Diagnosis not present

## 2023-07-19 DIAGNOSIS — I480 Paroxysmal atrial fibrillation: Secondary | ICD-10-CM | POA: Diagnosis not present

## 2023-07-19 DIAGNOSIS — Z0000 Encounter for general adult medical examination without abnormal findings: Secondary | ICD-10-CM

## 2023-07-19 NOTE — Assessment & Plan Note (Signed)
LDL is  Lab Results  Component Value Date   LDLCALC 59 07/16/2022   Current regimen is atorvastatin.  Tolerating medications well without issues.

## 2023-07-19 NOTE — Assessment & Plan Note (Addendum)
Doing well in HD at [facility] - still using permacath until they have used his LUE AVG successfully x 2 weeks.

## 2023-07-19 NOTE — Assessment & Plan Note (Signed)
Supplemented. Lab Results  Component Value Date   TSH 1.951 05/28/2022

## 2023-07-19 NOTE — Progress Notes (Signed)
Date:  07/19/2023   Name:  Ronald Ronald [name] [facility]   DOB:  Dec 15, 1945   MRN:  [id number]   Chief Complaint: Annual Exam Ronald Ronald [name] Ronald [name] is a 78 y.o. male who presents today for his Complete Annual Exam. He feels well. He reports exercising none. He reports he is sleeping poorly.   Health Maintenance  Topic Date Due   COVID-19 Vaccine (3 - Moderna risk series) 09/22/2019   Zoster (Shingles) Vaccine (1 of 2) 08/25/2023*   Medicare Annual Wellness Visit  06/22/2024   DTaP/Tdap/Td vaccine (4 - Td or Tdap) 01/23/2032   Pneumonia Vaccine  Completed   Flu Shot  Completed   Hepatitis C Screening  Completed   HPV Vaccine  Aged Out   Colon Cancer Screening  Discontinued  *Topic was postponed. The date shown is not the original due date.    Lab Results  Component Value Date   PSA 0.8 09/13/2013    Hyperlipidemia This is a chronic problem. The problem is controlled. Pertinent negatives include no chest pain or shortness of breath. Current antihyperlipidemic treatment includes statins. The current treatment provides significant improvement of lipids.  Thyroid Problem Presents for follow-up visit. Patient reports no anxiety, constipation, diarrhea, fatigue or palpitations. The symptoms have been stable. His past medical history is significant for hyperlipidemia.  Insomnia Primary symptoms: difficulty falling asleep, frequent awakening.   The problem occurs nightly. The problem is unchanged. Treatments tried: Benadryl nightly.  CAD - stable CHF followed by Cardiology.  Review of Systems  Constitutional:  Negative for fatigue and unexpected weight change.  HENT:  Negative for nosebleeds.   Eyes:  Negative for visual disturbance.  Respiratory:  Negative for cough, chest tightness, shortness of breath and wheezing.   Cardiovascular:  Negative for chest pain, palpitations and leg swelling.  Gastrointestinal:  Negative for abdominal pain, constipation and diarrhea.  Neurological:  Negative for  dizziness, weakness, light-headedness and headaches.  Psychiatric/Behavioral:  Negative for dysphoric mood. The patient has insomnia. The patient is not nervous/anxious.      Lab Results  Component Value Date   NA 133 (L) 05/31/2023   K 3.9 05/31/2023   CO2 22 05/31/2023   GLUCOSE 109 (H) 05/31/2023   BUN 50 (H) 05/31/2023   CREATININE 6.58 (H) 05/31/2023   CALCIUM 8.4 (L) 05/31/2023   EGFR 14 (L) 05/19/2022   GFRNONAA 8 (L) 05/31/2023   Lab Results  Component Value Date   CHOL 109 07/16/2022   HDL 37 (L) 07/16/2022   LDLCALC 59 07/16/2022   TRIG 63 07/16/2022   CHOLHDL 2.9 07/16/2022   Lab Results  Component Value Date   TSH 1.951 05/28/2022   Lab Results  Component Value Date   HGBA1C 5.8 09/06/2022   Lab Results  Component Value Date   WBC 5.8 05/31/2023   HGB 11.6 (L) 05/31/2023   HCT 34.9 (L) 05/31/2023   MCV 93.8 05/31/2023   PLT 127 (L) 05/31/2023   Lab Results  Component Value Date   ALT 76 (H) 05/31/2023   AST 86 (H) 05/31/2023   ALKPHOS 154 (H) 05/31/2023   BILITOT 1.3 (H) 05/31/2023   Lab Results  Component Value Date   VD25OH 83 09/06/2022     Patient Active Problem List   Diagnosis Date Noted   HFrEF (heart failure with reduced ejection fraction) (HCC) 07/19/2023   Muscle tension headache 11/15/2022   Long term (current) use of anticoagulants 11/10/2022   ESRD on dialysis (HCC) 09/16/2022  Physical deconditioning 09/16/2022   Bilateral leg edema 09/13/2022   Coronary artery disease of native artery of native heart with stable angina pectoris (HCC) 07/22/2022   OSA (obstructive sleep apnea) 07/22/2022   Chronic systolic CHF (congestive heart failure) (HCC) 07/15/2022   Typical atrial flutter (HCC) 07/15/2022   Acquired hypothyroidism 05/28/2022   GERD without esophagitis 05/28/2022   Essential hypertension 05/05/2022   Prediabetes 04/06/2022   Spinal stenosis of lumbar region without neurogenic claudication 04/06/2022   Weakness of  both lower extremities 04/06/2022   Upper airway cough syndrome 10/01/2021   Bursitis of hip 12/18/2018   Paroxysmal atrial fibrillation (HCC) 06/10/2018   Atherosclerosis of native arteries of extremity with intermittent claudication (HCC) 06/25/2016   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   Coronary artery disease involving native coronary artery with angina pectoris (HCC)    Allergic rhinitis 08/19/2015   Anemia of renal disease 10/04/2014   AA (aortic aneurysm) (HCC) 10/04/2014   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   Basal cell papilloma 10/04/2014   Unspecified osteoarthritis, unspecified site 10/04/2014   Thoracic aortic aneurysm (TAA) (HCC) 10/04/2014   Atherosclerotic heart disease of native coronary artery without angina pectoris 10/04/2014   Benign prostatic hyperplasia with lower urinary tract symptoms 10/24/2012   DOE (dyspnea on exertion) 11/12/2009   Renal artery stenosis     Mixed hyperlipidemia 09/04/2008   History of redo bypass grafting 09/04/2008    Allergies  Allergen Reactions   Predicort [Prednisolone] Other (See Comments)    Stomach pain   Ciprofloxacin Other (See Comments)    GI upset   Hydrochlorothiazide Other (See Comments)    Dehydration   Hydrocodone Nausea Only and Other (See Comments)    Stomach upset    Hydrocodone-Acetaminophen Nausea Only    Stomach upset   Sulfa Antibiotics Other (See Comments)    Cannot recall   Penicillins Hives, Rash and Other (See Comments)    Has patient had a PCN reaction causing immediate rash, facial/tongue/throat swelling, SOB or lightheadedness with hypotension: YES  Has patient had a PCN reaction causing severe rash involving mucus membranes or skin necrosis: NO  Has patient had a PCN reaction that required hospitalization NO  Has patient had a PCN reaction occurring within the last 10 years:NO  If all of the above answers are "NO", then may proceed with Cephalosporin use.  Has patient had a PCN reaction causing  immediate rash, facial/tongue/throat swelling, SOB or lightheadedness with hypotension: YES Has patient had a PCN reaction causing severe rash involving mucus membranes or skin necrosis: NO Has patient had a PCN reaction that required hospitalization NO Has patient had a PCN reaction occurring within the last 10 years:NO If all of the above answers are "NO", then may proceed with Cephalosporin use.    Past Surgical History:  Procedure Laterality Date   CARDIAC CATHETERIZATION  "several"   CARDIAC CATHETERIZATION N/A 12/13/2014   Procedure: Left Heart Cath and Coronary Angiography;  Surgeon: Corky Crafts, MD;  Location: [facility] INVASIVE CV LAB;  Service: Cardiovascular;  Laterality: N/A;   CARDIAC CATHETERIZATION  12/13/2014   Procedure: Coronary Stent Intervention;  Surgeon: Corky Crafts, MD;  Location: [facility] INVASIVE CV LAB;  Service: Cardiovascular;;   CARDIAC CATHETERIZATION N/A 10/07/2015   Procedure: Left Heart Cath and Cors/Grafts Angiography;  Surgeon: Kathleene Hazel, MD;  Location: [facility] INVASIVE CV LAB;  Service: Cardiovascular;  Laterality: N/A;   CARDIAC CATHETERIZATION N/A 10/07/2015   Procedure: Coronary Stent Intervention;  Surgeon: Kathleene Hazel, MD;  Location: [facility] INVASIVE CV LAB;  Service: Cardiovascular;  Laterality: N/A;   CARDIOVERSION N/A 05/25/2022   Procedure: CARDIOVERSION;  Surgeon: Thomasene Ripple, DO;  Location: [facility] ENDOSCOPY;  Service: Cardiovascular;  Laterality: N/A;   CATARACT EXTRACTION W/PHACO Left 10/21/2021   Procedure: CATARACT EXTRACTION PHACO AND INTRAOCULAR LENS PLACEMENT (IOC) LEFT 3.94 00:33.7;  Surgeon: Nevada Crane, MD;  Location: [facility] SURGERY CNTR;  Service: Ophthalmology;  Laterality: Left;   CATARACT EXTRACTION W/PHACO Right 11/09/2021   Procedure: CATARACT EXTRACTION PHACO AND INTRAOCULAR LENS PLACEMENT (IOC) RIGHT;  Surgeon: Nevada Crane, MD;  Location: [facility]ERY [facility];  Service: Ophthalmology;  Laterality: Right;   3.59 0:29.4   CORONARY ANGIOPLASTY  "several"   CORONARY ANGIOPLASTY WITH STENT PLACEMENT  2005; 12/13/2014   "2; 1"   CORONARY ARTERY BYPASS GRAFT  05/31/1994   CABG X5   CORONARY ARTERY BYPASS GRAFT  07/29/2005   CABG X3   DIALYSIS/PERMA CATHETER INSERTION N/A 09/10/2022   Procedure: DIALYSIS/PERMA CATHETER INSERTION;  Surgeon: Renford Dills, MD;  Location: [facility] INVASIVE CV LAB;  Service: Cardiovascular;  Laterality: N/A;   ESOPHAGOGASTRODUODENOSCOPY (EGD) WITH ESOPHAGEAL DILATION  05/31/1998   GREEN LIGHT LASER TURP (TRANSURETHRAL RESECTION OF PROSTATE  01/30/1999   "not cancerous"   HERNIA REPAIR     LAPAROSCOPIC CHOLECYSTECTOMY     LEFT HEART CATH AND CORS/GRAFTS ANGIOGRAPHY N/A 07/28/2017   Procedure: LEFT HEART CATH AND CORS/GRAFTS ANGIOGRAPHY;  Surgeon: Lennette Bihari, MD;  Location: [facility] INVASIVE CV LAB;  Service: Cardiovascular;  Laterality: N/A;   LUNG SURGERY  05/31/1994   "S/P CABG, had to put staple in lung after it had collapsed"   MINOR REMOVAL OF PERITONEAL DIALYSIS CATHETER  10/2022   UMBILICAL HERNIA REPAIR     w/chole    Social History   Tobacco Use   Smoking status: Former    Current packs/day: 0.00    Average packs/day: 3.0 packs/day for 20.0 years (60.0 ttl pk-yrs)    Types: Cigarettes    Start date: 07/25/1966    Quit date: 07/25/1986    Years since quitting: 37.0   Smokeless tobacco: Never  Vaping Use   Vaping status: Never Used  Substance Use Topics   Alcohol use: No    Alcohol/week: 0.0 standard drinks of alcohol   Drug use: No     Medication list has been reviewed and updated.  Current Meds  Medication Sig   acetaminophen (TYLENOL) 650 MG CR tablet Take 1,300 mg by mouth at bedtime.   allopurinol (ZYLOPRIM) 100 MG tablet TAKE 1 TABLET BY MOUTH EVERYDAY AT BEDTIME   amiodarone (PACERONE) 200 MG tablet TAKE 1 TABLET BY MOUTH EVERY DAY   atorvastatin (LIPITOR) 40 MG tablet TAKE 1 TABLET BY MOUTH EVERYDAY AT BEDTIME   Cholecalciferol  (VITAMIN D-3) 125 MCG (5000 UT) TABS Take 2,000 Units by mouth daily.    diphenhydrAMINE (BENADRYL) 50 MG capsule Take 50 mg by mouth at bedtime.   ELIQUIS 5 MG TABS tablet TAKE 1 TABLET BY MOUTH TWICE A DAY   furosemide (LASIX) 40 MG tablet Take 40 mg by mouth daily as needed for edema. Taking 1 1/2 tablets (60mg  total) Pt takes on Tue. Thur. Sat.and Sunday's.   levothyroxine (SYNTHROID) 75 MCG tablet TAKE 1 TABLET BY MOUTH EVERY DAY BEFORE BREAKFAST   metoprolol succinate (TOPROL XL) 100 MG 24 hr tablet Take 1 tablet (100 mg total) by mouth daily. Take with or immediately following a meal.   Multiple Vitamin (MULTIVITAMIN PO) Take 1  tablet by mouth daily.   nitroGLYCERIN (NITROSTAT) 0.4 MG SL tablet Place 0.4 mg under the tongue every 5 (five) minutes as needed for chest pain (Up to 3 times).   pantoprazole (PROTONIX) 40 MG tablet Take 1 tablet (40 mg total) by mouth 2 (two) times daily. Home med.   senna (SENOKOT) 8.6 MG tablet Take 1 tablet by mouth as needed for constipation.       07/19/2023    9:37 AM 05/27/2023    1:46 PM 09/24/2022    2:04 PM 07/26/2022    1:48 PM  GAD 7 : Generalized Anxiety Score  Nervous, Anxious, on Edge 0 0 1 3  Control/stop worrying 1 0 1 3  Worry too much - different things 1 0 1 3  Trouble relaxing 1 0 3 3  Restless 1 0 2 0  Easily annoyed or irritable 0 2 0 0  Afraid - awful might happen 0 0 0 2  Total GAD 7 Score 4 2 8 14   Anxiety Difficulty Not difficult at all Not difficult at all Somewhat difficult Somewhat difficult       07/19/2023    9:36 AM 06/23/2023    3:18 PM 05/27/2023    1:45 PM  Depression screen PHQ 2/9  Decreased Interest 1 0 2  Down, Depressed, Hopeless 0 0 0  PHQ - 2 Score 1 0 2  Altered sleeping 3 1 2   Tired, decreased energy 3 1 2   Change in appetite 0 0 2  Feeling bad or failure about yourself  1 1 0  Trouble concentrating 0 0 0  Moving slowly or fidgety/restless 0 0 1  Suicidal thoughts 0 0 0  PHQ-9 Score 8 3 9    Difficult doing work/chores Not difficult at all Somewhat difficult Somewhat difficult    BP Readings from Last 3 Encounters:  07/19/23 104/60  07/14/23 (!) 100/57  05/31/23 116/62    Physical Exam Vitals and nursing note reviewed.  Constitutional:      General: He is not in acute distress.    Appearance: He is well-developed.  HENT:     Head: Normocephalic and atraumatic.  Cardiovascular:     Rate and Rhythm: Normal rate and regular rhythm.  Pulmonary:     Effort: Pulmonary effort is normal. No respiratory distress.  Chest:       Comments: Permacath for HD Abdominal:     Palpations: Abdomen is soft.     Tenderness: There is no abdominal tenderness. There is no guarding or rebound.  Musculoskeletal:     Cervical back: Normal range of motion. Deformity (kyphosis) present.     Right lower leg: No edema.     Left lower leg: No edema.  Lymphadenopathy:     Cervical: No cervical adenopathy.  Skin:    General: Skin is warm and dry.     Capillary Refill: Capillary refill takes less than 2 seconds.     Findings: No rash.  Neurological:     General: No focal deficit present.     Mental Status: He is alert and oriented to person, place, and time.  Psychiatric:        Mood and Affect: Mood normal.        Behavior: Behavior normal.     Wt Readings from Last 3 Encounters:  07/19/23 203 lb 12.8 oz (92.4 kg)  07/14/23 204 lb 12.8 oz (92.9 kg)  06/23/23 200 lb (90.7 kg)    BP 104/60   Pulse 93  Ht 5\' 9"  (1.753 m)   Wt 203 lb 12.8 oz (92.4 kg)   SpO2 99%   BMI 30.10 kg/m   Assessment and Plan:  Problem List Items Addressed This Visit       Unprioritized   Mixed hyperlipidemia (Chronic)   LDL is  Lab Results  Component Value Date   LDLCALC 59 07/16/2022   Current regimen is atorvastatin.  Tolerating medications well without issues.       Relevant Orders   Lipid panel   Gouty arthropathy   Relevant Orders   Uric acid   Paroxysmal atrial fibrillation  (HCC) (Chronic)   Rated controlled on amiodarone and metoprolol On Eliquis without bleeding issues      Acquired hypothyroidism   Supplemented. Lab Results  Component Value Date   TSH 1.951 05/28/2022         Relevant Orders   TSH + free T4   ESRD on dialysis [facility])   Doing well in HD at [facility] - still using permacath until they have used his LUE AVG successfully x 2 weeks.      HFrEF (heart failure with reduced ejection fraction) (HCC)   Followed closely by Cardiology.      Other Visit Diagnoses       Annual physical exam    -  Primary     Immunization due       Relevant Orders   Pneumococcal conjugate vaccine 20-valent (Completed)       No follow-ups on file.    Reubin Milan, MD [facility] Primary Care and Sports Medicine [place]

## 2023-07-19 NOTE — Assessment & Plan Note (Signed)
Rated controlled on amiodarone and metoprolol On Eliquis without bleeding issues

## 2023-07-19 NOTE — Assessment & Plan Note (Signed)
Followed closely by Cardiology

## 2023-07-20 ENCOUNTER — Encounter: Payer: Self-pay | Admitting: Internal Medicine

## 2023-07-20 DIAGNOSIS — N186 End stage renal disease: Secondary | ICD-10-CM | POA: Diagnosis not present

## 2023-07-20 DIAGNOSIS — Z992 Dependence on renal dialysis: Secondary | ICD-10-CM | POA: Diagnosis not present

## 2023-07-20 LAB — URIC ACID: Uric Acid: 2.9 mg/dL — ABNORMAL LOW (ref 3.8–8.4)

## 2023-07-20 LAB — TSH+FREE T4
Free T4: 1.34 ng/dL (ref 0.82–1.77)
TSH: 8.09 u[IU]/mL — ABNORMAL HIGH (ref 0.450–4.500)

## 2023-07-20 LAB — LIPID PANEL
Chol/HDL Ratio: 2.6 {ratio} (ref 0.0–5.0)
Cholesterol, Total: 127 mg/dL (ref 100–199)
HDL: 49 mg/dL (ref >39)
LDL Chol Calc (NIH): 65 mg/dL (ref 0–99)
Triglycerides: 59 mg/dL (ref 0–149)
VLDL Cholesterol Cal: 13 mg/dL (ref 5–40)

## 2023-07-22 ENCOUNTER — Emergency Department
Admission: EM | Admit: 2023-07-22 | Discharge: 2023-07-22 | Disposition: A | Discharge status: Home / Self Care | Payer: Medicare Other | Attending: Emergency Medicine | Admitting: Emergency Medicine

## 2023-07-22 ENCOUNTER — Other Ambulatory Visit: Payer: Self-pay

## 2023-07-22 ENCOUNTER — Emergency Department: Payer: Medicare Other

## 2023-07-22 DIAGNOSIS — Z7901 Long term (current) use of anticoagulants: Secondary | ICD-10-CM | POA: Insufficient documentation

## 2023-07-22 DIAGNOSIS — X58XXXA Exposure to other specified factors, initial encounter: Secondary | ICD-10-CM | POA: Insufficient documentation

## 2023-07-22 DIAGNOSIS — S0993XA Unspecified injury of face, initial encounter: Secondary | ICD-10-CM | POA: Diagnosis present

## 2023-07-22 DIAGNOSIS — N186 End stage renal disease: Secondary | ICD-10-CM | POA: Diagnosis not present

## 2023-07-22 DIAGNOSIS — I517 Cardiomegaly: Secondary | ICD-10-CM | POA: Diagnosis not present

## 2023-07-22 DIAGNOSIS — C009 Malignant neoplasm of lip, unspecified: Secondary | ICD-10-CM | POA: Diagnosis not present

## 2023-07-22 DIAGNOSIS — S01511A Laceration without foreign body of lip, initial encounter: Secondary | ICD-10-CM | POA: Insufficient documentation

## 2023-07-22 DIAGNOSIS — I700 Atherosclerosis of aorta: Secondary | ICD-10-CM | POA: Diagnosis not present

## 2023-07-22 DIAGNOSIS — Z992 Dependence on renal dialysis: Secondary | ICD-10-CM | POA: Diagnosis not present

## 2023-07-22 DIAGNOSIS — J949 Pleural condition, unspecified: Secondary | ICD-10-CM | POA: Diagnosis not present

## 2023-07-22 LAB — CBC WITH DIFFERENTIAL/PLATELET
Abs Immature Granulocytes: 0.03 10*3/uL (ref 0.00–0.07)
Basophils Absolute: 0.1 10*3/uL (ref 0.0–0.1)
Basophils Relative: 1 %
Eosinophils Absolute: 0.5 10*3/uL (ref 0.0–0.5)
Eosinophils Relative: 9 %
HCT: 33 % — ABNORMAL LOW (ref 39.0–52.0)
Hemoglobin: 10.9 g/dL — ABNORMAL LOW (ref 13.0–17.0)
Immature Granulocytes: 1 %
Lymphocytes Relative: 12 %
Lymphs Abs: 0.7 10*3/uL (ref 0.7–4.0)
MCH: 32.4 pg (ref 26.0–34.0)
MCHC: 33 g/dL (ref 30.0–36.0)
MCV: 98.2 fL (ref 80.0–100.0)
Monocytes Absolute: 0.7 10*3/uL (ref 0.1–1.0)
Monocytes Relative: 11 %
Neutro Abs: 4.3 10*3/uL (ref 1.7–7.7)
Neutrophils Relative %: 66 %
Platelets: 128 10*3/uL — ABNORMAL LOW (ref 150–400)
RBC: 3.36 MIL/uL — ABNORMAL LOW (ref 4.22–5.81)
RDW: 17.1 % — ABNORMAL HIGH (ref 11.5–15.5)
WBC: 6.3 10*3/uL (ref 4.0–10.5)
nRBC: 0 % (ref 0.0–0.2)

## 2023-07-22 MED ORDER — LIDOCAINE-EPINEPHRINE 2 %-1:100000 IJ SOLN
10.0000 mL | Freq: Once | INTRAMUSCULAR | Status: AC
  Administered 2023-07-22: 10 mL
  Filled 2023-07-22: qty 1

## 2023-07-22 NOTE — ED Provider Notes (Addendum)
[facility] Provider Note    Event Date/Time   First MD Initiated Contact with Patient 07/22/23 0123     (approximate)   History   Post-op Problem   HPI  Ronald [name] is a 78 y.o. male   Past medical history of multiple medical comorbidities including atrial fibrillation on Eliquis who presents to the emergency department with a lip injury.  He has a habit of grinding his teeth and he has a puncture wound to his upper lip where one of his 2 remaining bottom teeth may have injured his upper lip while sleeping.  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  He awoke with blood on his lip and choked on a blood briefly.  He denies any respiratory symptoms currently.  He stated that there was a significant amount of blood when he woke up.  Independent Historian contributed to assessment above: His wife is at bedside to corroborate information past medical history as above    Physical Exam   Triage Vital Signs: ED Triage Vitals [07/22/23 0109]  Encounter Vitals Group     BP 102/69     Systolic BP Percentile      Diastolic BP Percentile      Pulse Rate 95     Resp 18     Temp 98.2 F (36.8 C)     Temp Source Oral     SpO2 97 %     Weight 202 lb (91.6 kg)     Height 5\' 9"  (1.753 m)     Head Circumference      Peak Flow      Pain Score      Pain Loc      Pain Education      Exclude from Growth Chart     Most recent vital signs: Vitals:   07/22/23 0109  BP: 102/69  Pulse: 95  Resp: 18  Temp: 98.2 F (36.8 C)  SpO2: 97%    General: Awake, no distress.  CV:  Good peripheral perfusion.  Resp:  Normal effort.  Abd:  No distention.  Other:  There is a small puncture wound to the right side upper lip that does not violate the vermilion border.  This aligns perfectly with a lower incisor when he closes his mouth.  He is in no respiratory distress clear lungs bilaterally  and no hypoxemia.  His vital signs are normal.   ED Results / Procedures / Treatments   Labs (all labs ordered are listed, but only abnormal results are displayed) Labs Reviewed  CBC WITH DIFFERENTIAL/PLATELET - Abnormal; Notable for the following components:      Result Value   RBC 3.36 (*)    Hemoglobin 10.9 (*)    HCT 33.0 (*)    RDW 17.1 (*)    Platelets 128 (*)    All other components within normal limits     I ordered and reviewed the above labs they are notable for H&H unchanged from prior   RADIOLOGY I independently reviewed and interpreted chest x-ray and see no obvious acute focal opacity or pneumonia I also reviewed radiologist's formal read.   PROCEDURES:  Critical Care performed: No  .Laceration Repair  Date/Time: 07/22/2023 2:36 AM  Performed by: Pilar Jarvis, MD Authorized by: Pilar Jarvis, MD   Consent:    Consent obtained:  Verbal   Consent given by:  Patient  Risks, benefits, and alternatives were discussed: yes     Risks discussed:  Infection, need for additional repair, pain and vascular damage   Alternatives discussed:  No treatment and delayed treatment Universal protocol:    Procedure explained and questions answered to patient or proxy's satisfaction: yes     Patient identity confirmed:  Verbally with patient Laceration details:    Location:  Lip   Lip location:  Upper interior lip   Length (cm):  0.5   Depth (mm):  3 Exploration:    Hemostasis achieved with:  Direct pressure and epinephrine   Wound extent: no foreign body   Treatment:    Amount of cleaning:  Standard   Irrigation solution:  Tap water Skin repair:    Repair method:  Sutures   Suture size:  5-0   Wound skin closure material used: Vicryl rapid.   Suture technique:  Figure eight   Number of sutures:  1 Approximation:    Approximation:  Close   Vermilion border well-aligned: Did not violate.   Repair type:    Repair type:  Simple Post-procedure details:     Dressing:  Open (no dressing)   Procedure completion:  Tolerated    MEDICATIONS ORDERED IN ED: Medications  lidocaine-EPINEPHrine (XYLOCAINE W/EPI) 2 %-1:100000 (with pres) injection 10 mL (10 mLs Other Given by Other 07/22/23 0207)     IMPRESSION / MDM / ASSESSMENT AND PLAN / ED COURSE  I reviewed the triage vital signs and the nursing notes.                                Patient's presentation is most consistent with acute complicated illness / injury requiring diagnostic workup.  Differential diagnosis includes, but is not limited to, lip laceration, acute blood loss anemia, aspiration pneumonitis or pneumonia   MDM: I think he bit his upper lip causing a small puncture wound.  It is gaping so I will repair.  There is small oozing blood but he noted that there was a significant amount of blood earlier so I will check an H&H.  He had a choking event we will check a chest x-ray.  I doubt pneumonia given no ongoing respiratory symptoms, clear lungs.  I advised that he use a bite guard at night to avoid reinjuring the area.  He will follow-up with PMD.  -- Repaired as above and now hemostatic.  During the repair and cleansing process of clot was still dislodged he had a mild amount of bleeding which was controlled after a figure-of-eight stitch was performed and closure of the wound.  He was observed in the emergency department to ensure hemostasis prior to discharge.  He was advised to return to the emergency department with any worsening bleeding or any other new or worsening complaints.    Chest x-ray shows no acute findings which would indicate pneumonia and his white blood cell count is normal, he is breathing comfortably no hypoxemia. H&H consistent w prior and not indicative of significant acute blood loss anemia, and I have low suspicion for life-threatening amount of blood loss given is hemodynamic stability  While I do think that this was a tooth bite injury, I advised that  he follow-up with PMD and dermatologist as well to reevaluate the area of skin cancer to ensure this does not appear to be any deeper skin cancer causing erosive changes.     FINAL CLINICAL IMPRESSION(S) / ED  DIAGNOSES   Final diagnoses:  Lip laceration, initial encounter     Rx / DC Orders   ED Discharge Orders     None        Note:  This document was prepared using Dragon voice recognition software and may include unintentional dictation errors.    Pilar Jarvis, MD 07/22/23 [id number]    Pilar Jarvis, MD 07/22/23 [phone]

## 2023-07-22 NOTE — Discharge Instructions (Addendum)
Your stitches will dissolve in a few days.  Use a bite guard or nighttime teeth grinding guard to protect the lip from further injury from the bottom tooth.  Come back to the emergency department if you have worsening bleeding that you cannot stop with direct pressure  There was no sign of a pneumonia (lung infection) but should you develop worsening cough, fever or difficulty breathing see your doctor right away or come back to the emergency department for recheck.  I think that this injury was due to a bite from the tooth below.  However is important that you follow-up with your primary doctor for proper healing and if there is any suspicion for this injury to be related to the cancer, you should follow-up with your dermatologist promptly.  Thank you for choosing [place] for your health care today!  Please see your primary doctor this week for a follow up appointment.   If you have any new, worsening, or unexpected symptoms call your doctor right away or come back to the emergency department for reevaluation.  It was my pleasure to care for you today.   Daneil Dan Modesto Charon, MD

## 2023-07-22 NOTE — ED Notes (Signed)
 ED Provider at bedside.

## 2023-07-22 NOTE — ED Triage Notes (Signed)
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 It started bleeding while he was sleeping, he woke me up bc he was choking on the blood."  Bleeding controlled in triage

## 2023-07-23 ENCOUNTER — Other Ambulatory Visit: Payer: Self-pay

## 2023-07-23 ENCOUNTER — Emergency Department
Admission: EM | Admit: 2023-07-23 | Discharge: 2023-07-23 | Disposition: A | Discharge status: Home / Self Care | Payer: Medicare Other | Attending: Emergency Medicine | Admitting: Emergency Medicine

## 2023-07-23 ENCOUNTER — Other Ambulatory Visit: Payer: Self-pay | Admitting: Internal Medicine

## 2023-07-23 ENCOUNTER — Encounter: Payer: Self-pay | Admitting: Internal Medicine

## 2023-07-23 DIAGNOSIS — Z85828 Personal history of other malignant neoplasm of skin: Secondary | ICD-10-CM | POA: Diagnosis not present

## 2023-07-23 DIAGNOSIS — S01511D Laceration without foreign body of lip, subsequent encounter: Secondary | ICD-10-CM | POA: Diagnosis not present

## 2023-07-23 DIAGNOSIS — K219 Gastro-esophageal reflux disease without esophagitis: Secondary | ICD-10-CM

## 2023-07-23 DIAGNOSIS — Z7901 Long term (current) use of anticoagulants: Secondary | ICD-10-CM | POA: Diagnosis not present

## 2023-07-23 DIAGNOSIS — X58XXXD Exposure to other specified factors, subsequent encounter: Secondary | ICD-10-CM | POA: Diagnosis not present

## 2023-07-23 DIAGNOSIS — S0993XD Unspecified injury of face, subsequent encounter: Secondary | ICD-10-CM | POA: Diagnosis present

## 2023-07-23 MED ORDER — PANTOPRAZOLE SODIUM 40 MG PO TBEC
40.0000 mg | DELAYED_RELEASE_TABLET | Freq: Two times a day (BID) | ORAL | 1 refills | Status: DC
Start: 2023-07-23 — End: 2024-01-20

## 2023-07-23 MED ORDER — LIDOCAINE-EPINEPHRINE-TETRACAINE (LET) TOPICAL GEL
3.0000 mL | Freq: Once | TOPICAL | Status: AC
  Administered 2023-07-23: 3 mL via TOPICAL
  Filled 2023-07-23: qty 3

## 2023-07-23 NOTE — Discharge Instructions (Addendum)
 Your evaluated in the ED for bleeding following a lip laceration.  We were able to control the bleeding.  Please adopt a soft food or liquid diet for the next week.  Try to avoid hitting the area of suture until completely healed.  Please review mouth laceration patient education packet attached to your discharge paper for more information.

## 2023-07-23 NOTE — ED Notes (Signed)
 Pt contact made any myself introduced. Pt is CAOx4, breathing normally, and normal in color. PT is complaining of bleeding from stiches on th inside of his lip. Pt has a towel and ice pack to his lip at this time and bleeding is controlled. Pt's wife is at bedside with him.

## 2023-07-23 NOTE — ED Triage Notes (Signed)
 Pt to ed from home via POV with his wife for bleeding to the inside of his lip. Pt had a cancer proceredure two weeks ago and it started bleeding yesterday, so he came here, and our ED staff placed a couple stitches and it is still bleeding so he came back. Pt is caox4, in no acute distress in triage and bleeding appears to be mostly controlled at this time with ice placed by pt.

## 2023-07-23 NOTE — ED Provider Notes (Signed)
 [facility] Emergency Department Provider Note     Event Date/Time   First MD Initiated Contact with Patient 07/23/23 2111     (approximate)   History   bleeding - mouth  and Wound Check   HPI  Ronald [name] is a 78 y.o. male with a past medical history of A-fib on Eliquis presents to the ED for evaluation of a lip injury.    Patient reports after dinner tonight bleeding occurred and he could not get it to stop.  Bleeding has slowed down now.  There it is a suture still in place over his upper anterior lip.  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   He denies difficulty breathing or swallowing.     Physical Exam   Triage Vital Signs: ED Triage Vitals  Encounter Vitals Group     BP 07/23/23 2101 123/64     Systolic BP Percentile --      Diastolic BP Percentile --      Pulse Rate 07/23/23 2101 90     Resp 07/23/23 2101 14     Temp 07/23/23 2101 98.6 F (37 C)     Temp Source 07/23/23 2101 Oral     SpO2 07/23/23 2101 98 %     Weight 07/23/23 2102 198 lb 6.6 oz (90 kg)     Height 07/23/23 2102 5\' 9"  (1.753 m)     Head Circumference --      Peak Flow --      Pain Score 07/23/23 2102 0     Pain Loc --      Pain Education --      Exclude from Growth Chart --     Most recent vital signs: Vitals:   07/23/23 2101 07/23/23 2319  BP: 123/64 123/66  Pulse: 90 92  Resp: 14 17  Temp: 98.6 F (37 C) 98.7 F (37.1 C)  SpO2: 98% 99%    General Awake, no distress.  HEENT NCAT. PERRL. EOMI. No rhinorrhea. Mucous membranes are moist.  CV:  Good peripheral perfusion.  RESP:  Normal effort.  ABD:  No distention.  Other:  1 Suture in place on right upper interior lip.  Blood clots noted. Dentures in place. Bottom right lateral incisor aligns with lip injury area.    ED Results / Procedures / Treatments   Labs (all labs ordered  are listed, but only abnormal results are displayed) Labs Reviewed - No data to display  No results found.  PROCEDURES:  Critical Care performed: No  Procedures   MEDICATIONS ORDERED IN ED: Medications  lidocaine-EPINEPHrine-tetracaine (LET) topical gel (3 mLs Topical Given 07/23/23 2210)   IMPRESSION / MDM / ASSESSMENT AND PLAN / ED COURSE  I reviewed the triage vital signs and the nursing notes.                               78 y.o. male presents to the emergency department for evaluation and treatment of lip injury. See HPI for further details.   Differential diagnosis includes, but is not limited to lip laceration, lip injury  Patient's presentation is most consistent with acute, uncomplicated illness.  Patient is alert and oriented.  He is hemodynamically stable.  Physical exam findings are stated above.  During evaluation bleeding has ceased  completely.  I used suction to remove the blood clots from the area and applied LET to the area.  I encouraged the patient to adopt a liquid and soft food diet until healing can occur for lip.  He was observed in the ED for continuing hemostasis.  This was met and he was safely discharged home in stable condition.  Encouraged to follow-up with primary care provider for further evaluation. All questions and concerns were addressed during this ED visit.     FINAL CLINICAL IMPRESSION(S) / ED DIAGNOSES   Final diagnoses:  Lip laceration, subsequent encounter   Rx / DC Orders   ED Discharge Orders     None      Note:  This document was prepared using Dragon voice recognition software and may include unintentional dictation errors.    Romeo Apple, Semiah Konczal A, PA-C 07/23/23 2341    Trinna Post, MD 07/24/23 [phone]

## 2023-07-25 DIAGNOSIS — N186 End stage renal disease: Secondary | ICD-10-CM | POA: Diagnosis not present

## 2023-07-25 DIAGNOSIS — Z992 Dependence on renal dialysis: Secondary | ICD-10-CM | POA: Diagnosis not present

## 2023-07-26 ENCOUNTER — Telehealth: Payer: Self-pay

## 2023-07-26 ENCOUNTER — Ambulatory Visit (INDEPENDENT_AMBULATORY_CARE_PROVIDER_SITE_OTHER): Payer: Medicare Other | Admitting: Internal Medicine

## 2023-07-26 ENCOUNTER — Encounter: Payer: Self-pay | Admitting: Internal Medicine

## 2023-07-26 VITALS — BP 128/74 | HR 89 | Ht 69.0 in | Wt 203.0 lb

## 2023-07-26 DIAGNOSIS — D6869 Other thrombophilia: Secondary | ICD-10-CM

## 2023-07-26 DIAGNOSIS — S01511D Laceration without foreign body of lip, subsequent encounter: Secondary | ICD-10-CM

## 2023-07-26 NOTE — Transitions of Care (Post Inpatient/ED Visit) (Signed)
   07/26/2023  Name: [name] MRN: [id number] DOB: June 15, 1945  Today's TOC FU Call Status: Today's TOC FU Call Status:: Successful TOC FU Call Completed TOC FU Call Complete Date: 07/26/23  Attempted to reach the patient regarding the most recent Inpatient/ED visit. Already seen Follow Up Plan: No further outreach attempts will be made at this time. We have been unable to contact the patient.  Signature Karena Addison, LPN [facility] Nurse Health Advisor Direct Dial [phone]

## 2023-07-26 NOTE — Progress Notes (Signed)
 Date:  07/26/2023   Name:  Ashden Sonnenberg [facility]   DOB:  01-03-1946   MRN:  [id number]   Chief Complaint: Facial Laceration  Laceration  The incident occurred more than 1 week ago. Pain location: lip. The laceration mechanism is unknown (probably his tooth).The patient is experiencing no pain.    Review of Systems  Constitutional:  Negative for chills, fatigue and fever.  Respiratory:  Negative for chest tightness and shortness of breath.   Cardiovascular:  Negative for chest pain.  Skin:  Positive for wound.  Psychiatric/Behavioral:  Negative for dysphoric mood and sleep disturbance. The patient is not nervous/anxious.      Lab Results  Component Value Date   NA 133 (L) 05/31/2023   K 3.9 05/31/2023   CO2 22 05/31/2023   GLUCOSE 109 (H) 05/31/2023   BUN 50 (H) 05/31/2023   CREATININE 6.58 (H) 05/31/2023   CALCIUM 8.4 (L) 05/31/2023   EGFR 14 (L) 05/19/2022   GFRNONAA 8 (L) 05/31/2023   Lab Results  Component Value Date   CHOL 127 07/19/2023   HDL 49 07/19/2023   LDLCALC 65 07/19/2023   TRIG 59 07/19/2023   CHOLHDL 2.6 07/19/2023   Lab Results  Component Value Date   TSH 8.090 (H) 07/19/2023   Lab Results  Component Value Date   HGBA1C 5.8 09/06/2022   Lab Results  Component Value Date   WBC 6.3 07/22/2023   HGB 10.9 (L) 07/22/2023   HCT 33.0 (L) 07/22/2023   MCV 98.2 07/22/2023   PLT 128 (L) 07/22/2023   Lab Results  Component Value Date   ALT 76 (H) 05/31/2023   AST 86 (H) 05/31/2023   ALKPHOS 154 (H) 05/31/2023   BILITOT 1.3 (H) 05/31/2023   Lab Results  Component Value Date   VD25OH 83 09/06/2022     Patient Active Problem List   Diagnosis Date Noted   HFrEF (heart failure with reduced ejection fraction) (HCC) 07/19/2023   Muscle tension headache 11/15/2022   Acquired thrombophilia (HCC) 11/10/2022   ESRD on dialysis (HCC) 09/16/2022   Physical deconditioning 09/16/2022   Bilateral leg edema 09/13/2022   Coronary artery disease of native  artery of native heart with stable angina pectoris (HCC) 07/22/2022   OSA (obstructive sleep apnea) 07/22/2022   Chronic systolic CHF (congestive heart failure) (HCC) 07/15/2022   Typical atrial flutter (HCC) 07/15/2022   Acquired hypothyroidism 05/28/2022   GERD without esophagitis 05/28/2022   Essential hypertension 05/05/2022   Prediabetes 04/06/2022   Spinal stenosis of lumbar region without neurogenic claudication 04/06/2022   Weakness of both lower extremities 04/06/2022   Upper airway cough syndrome 10/01/2021   Bursitis of hip 12/18/2018   Paroxysmal atrial fibrillation (HCC) 06/10/2018   Atherosclerosis of native arteries of extremity with intermittent claudication (HCC) 06/25/2016   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   Coronary artery disease involving native coronary artery with angina pectoris (HCC)    Allergic rhinitis 08/19/2015   Anemia of renal disease 10/04/2014   AA (aortic aneurysm) (HCC) 10/04/2014   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   Basal cell papilloma 10/04/2014   Unspecified osteoarthritis, unspecified site 10/04/2014   Thoracic aortic aneurysm (TAA) (HCC) 10/04/2014   Atherosclerotic heart disease of native coronary artery without angina pectoris 10/04/2014   Benign prostatic hyperplasia with lower urinary tract symptoms 10/24/2012   DOE (dyspnea on exertion) 11/12/2009   Renal artery stenosis     Mixed hyperlipidemia 09/04/2008   History of redo bypass grafting 09/04/2008  Allergies  Allergen Reactions   Predicort [Prednisolone] Other (See Comments)    Stomach pain   Ciprofloxacin Other (See Comments)    GI upset   Hydrochlorothiazide Other (See Comments)    Dehydration   Hydrocodone Nausea Only and Other (See Comments)    Stomach upset    Hydrocodone-Acetaminophen Nausea Only    Stomach upset   Sulfa Antibiotics Other (See Comments)    Cannot recall   Penicillins Hives, Rash and Other (See Comments)    Has patient had a PCN reaction  causing immediate rash, facial/tongue/throat swelling, SOB or lightheadedness with hypotension: YES  Has patient had a PCN reaction causing severe rash involving mucus membranes or skin necrosis: NO  Has patient had a PCN reaction that required hospitalization NO  Has patient had a PCN reaction occurring within the last 10 years:NO  If all of the above answers are "NO", then may proceed with Cephalosporin use.  Has patient had a PCN reaction causing immediate rash, facial/tongue/throat swelling, SOB or lightheadedness with hypotension: YES Has patient had a PCN reaction causing severe rash involving mucus membranes or skin necrosis: NO Has patient had a PCN reaction that required hospitalization NO Has patient had a PCN reaction occurring within the last 10 years:NO If all of the above answers are "NO", then may proceed with Cephalosporin use.    Past Surgical History:  Procedure Laterality Date   CARDIAC CATHETERIZATION  "several"   CARDIAC CATHETERIZATION N/A 12/13/2014   Procedure: Left Heart Cath and Coronary Angiography;  Surgeon: Corky Crafts, MD;  [facility] INVAS[facility] CV LAB;  Service: Cardiovascular;  Laterality: N/A;   CARDIAC CATHETERIZATION  12/13/2014   Procedure: Coronary Stent Intervention;  Surgeon: Corky Crafts, MD;  Location: [facility] INVASIVE CV LAB;  Service: Cardiovascular;;   CARDIAC CATHETERIZATION N/A 10/07/2015   Procedure: Left Heart Cath and Cors/Grafts Angiography;  Surgeon: Kathleene Hazel, MD;  Location: [facility] INVASIVE CV LAB;  Service: Cardiovascular;  Laterality: N/A;   CARDIAC CATHETERIZATION N/A 10/07/2015   Procedure: Coronary Stent Intervention;  Surgeon: Kathleene Hazel, MD;  Location: [facility] INVASIVE CV LAB;  Service: Cardiovascular;  Laterality: N/A;   CARDIOVERSION N/A 05/25/2022   Procedure: CARDIOVERSION;  Surgeon: Thomasene Ripple, DO;  Location: [facility] ENDOSCOPY;  Service: Cardiovascular;  Laterality: N/A;   CATARACT EXTRACTION W/PHACO Left  10/21/2021   Procedure: CATARACT EXTRACTION PHACO AND INTRAOCULAR LENS PLACEMENT (IOC) LEFT 3.94 00:33.7;  Surgeon: Nevada Crane, MD;  Location: [facility];  Service: Ophthalmology;  Laterality: Left;   CATARACT EXTRACTION W/PHACO Right 11/09/2021   Procedure: CATARACT EXTRACTION PHACO AND INTRAOCULAR LENS PLACEMENT (IOC) RIGHT;  Surgeon: Nevada Crane, MD;  Location: [facility];  Service: Ophthalmology;  Laterality: Right;  3.59 0:29.4   CORONARY ANGIOPLASTY  "several"   CORONARY ANGIOPLASTY WITH STENT PLACEMENT  2005; 12/13/2014   "2; 1"   CORONARY ARTERY BYPASS GRAFT  05/31/1994   CABG X5   CORONARY ARTERY BYPASS GRAFT  07/29/2005   CABG X3   DIALYSIS/PERMA CATHETER INSERTION N/A 09/10/2022   Procedure: DIALYSIS/PERMA CATHETER INSERTION;  Surgeon: Renford Dills, MD;  Location: [facility] INVASIVE CV LAB;  Service: Cardiovascular;  Laterality: N/A;   ESOPHAGOGASTRODUODENOSCOPY (EGD) WITH ESOPHAGEAL DILATION  05/31/1998   GREEN LIGHT LASER TURP (TRANSURETHRAL RESECTION OF PROSTATE  01/30/1999   "not cancerous"   HERNIA REPAIR     LAPAROSCOPIC CHOLECYSTECTOMY     LEFT HEART CATH AND CORS/GRAFTS ANGIOGRAPHY N/A 07/28/2017   Procedure: LEFT HEART CATH AND  CORS/GRAFTS ANGIOGRAPHY;  Surgeon: Lennette Bihari, MD;  Location: [facility] INVASIVE CV LAB;  Service: Cardiovascular;  Laterality: N/A;   LUNG SURGERY  05/31/1994   "S/P CABG, had to put staple in lung after it had collapsed"   MINOR REMOVAL OF PERITONEAL DIALYSIS CATHETER  10/2022   UMBILICAL HERNIA REPAIR     w/chole    Social History   Tobacco Use   Smoking status: Former    Current packs/day: 0.00    Average packs/day: 3.0 packs/day for 20.0 years (60.0 ttl pk-yrs)    Types: Cigarettes    Start date: 07/25/1966    Quit date: 07/25/1986    Years since quitting: 37.0   Smokeless tobacco: Never  Vaping Use   Vaping status: Never Used  Substance Use Topics   Alcohol use: No    Alcohol/week: 0.0 standard  drinks of alcohol   Drug use: No     Medication list has been reviewed and updated.  Current Meds  Medication Sig   acetaminophen (TYLENOL) 650 MG CR tablet Take 1,300 mg by mouth at bedtime.   allopurinol (ZYLOPRIM) 100 MG tablet TAKE 1 TABLET BY MOUTH EVERYDAY AT BEDTIME   amiodarone (PACERONE) 200 MG tablet TAKE 1 TABLET BY MOUTH EVERY DAY   atorvastatin (LIPITOR) 40 MG tablet TAKE 1 TABLET BY MOUTH EVERYDAY AT BEDTIME   Cholecalciferol (VITAMIN D-3) 125 MCG (5000 UT) TABS Take 2,000 Units by mouth daily.    diphenhydrAMINE (BENADRYL) 50 MG capsule Take 50 mg by mouth at bedtime.   ELIQUIS 5 MG TABS tablet TAKE 1 TABLET BY MOUTH TWICE A DAY   furosemide (LASIX) 40 MG tablet Take 40 mg by mouth daily as needed for edema. Taking 1 1/2 tablets (60mg  total) Pt takes on Tue. Thur. Sat.and Sunday's.   levothyroxine (SYNTHROID) 75 MCG tablet TAKE 1 TABLET BY MOUTH EVERY DAY BEFORE BREAKFAST   metoprolol succinate (TOPROL XL) 100 MG 24 hr tablet Take 1 tablet (100 mg total) by mouth daily. Take with or immediately following a meal.   Multiple Vitamin (MULTIVITAMIN PO) Take 1 tablet by mouth daily.   nitroGLYCERIN (NITROSTAT) 0.4 MG SL tablet Place 0.4 mg under the tongue every 5 (five) minutes as needed for chest pain (Up to 3 times).   pantoprazole (PROTONIX) 40 MG tablet Take 1 tablet (40 mg total) by mouth 2 (two) times daily. Home med.   senna (SENOKOT) 8.6 MG tablet Take 1 tablet by mouth as needed for constipation.       07/26/2023    1:58 PM 07/19/2023    9:37 AM 05/27/2023    1:46 PM 09/24/2022    2:04 PM  GAD 7 : Generalized Anxiety Score  Nervous, Anxious, on Edge 0 0 0 1  Control/stop worrying 0 1 0 1  Worry too much - different things 0 1 0 1  Trouble relaxing 0 1 0 3  Restless 0 1 0 2  Easily annoyed or irritable 0 0 2 0  Afraid - awful might happen 0 0 0 0  Total GAD 7 Score 0 4 2 8   Anxiety Difficulty Not difficult at all Not difficult at all Not difficult at all  Somewhat difficult       07/26/2023    1:58 PM 07/19/2023    9:36 AM 06/23/2023    3:18 PM  Depression screen PHQ 2/9  Decreased Interest 0 1 0  Down, Depressed, Hopeless 0 0 0  PHQ - 2 Score 0 1 0  Altered sleeping 0 3 1  Tired, decreased energy 0 3 1  Change in appetite 0 0 0  Feeling bad or failure about yourself  0 1 1  Trouble concentrating 0 0 0  Moving slowly or fidgety/restless 0 0 0  Suicidal thoughts 0 0 0  PHQ-9 Score 0 8 3  Difficult doing work/chores Not difficult at all Not difficult at all Somewhat difficult    BP Readings from Last 3 Encounters:  07/26/23 128/74  07/23/23 123/66  07/22/23 102/69    Physical Exam Vitals and nursing note reviewed.  Constitutional:      General: He is not in acute distress.    Appearance: He is well-developed. He is obese.  HENT:     Head: Normocephalic and atraumatic.     Mouth/Throat:     Comments: Stitch intact to right upper inner lip - no bleeding or hematoma noted Cardiovascular:     Rate and Rhythm: Normal rate and regular rhythm.  Pulmonary:     Effort: Pulmonary effort is normal. No respiratory distress.     Breath sounds: No wheezing or rhonchi.  Skin:    General: Skin is warm and dry.     Findings: No rash.  Neurological:     Mental Status: He is alert and oriented to person, place, and time.  Psychiatric:        Mood and Affect: Mood normal.        Behavior: Behavior normal.     Wt Readings from Last 3 Encounters:  07/26/23 203 lb (92.1 kg)  07/23/23 198 lb 6.6 oz (90 kg)  07/22/23 202 lb (91.6 kg)    BP 128/74   Pulse 89   Ht 5\' 9"  (1.753 m)   Wt 203 lb (92.1 kg)   SpO2 100%   BMI 29.98 kg/m   Assessment and Plan:  Problem List Items Addressed This Visit       Unprioritized   Acquired thrombophilia (HCC) - Primary   Other Visit Diagnoses       Lip laceration, subsequent encounter       suture intact - no bleeding continue soft diet, avoid irritation dissolving suture in place  should loosen soon       No follow-ups on file.    Reubin Milan, MD [facility] [place]

## 2023-07-27 DIAGNOSIS — Z992 Dependence on renal dialysis: Secondary | ICD-10-CM | POA: Diagnosis not present

## 2023-07-27 DIAGNOSIS — N186 End stage renal disease: Secondary | ICD-10-CM | POA: Diagnosis not present

## 2023-07-29 DIAGNOSIS — Z992 Dependence on renal dialysis: Secondary | ICD-10-CM | POA: Diagnosis not present

## 2023-07-29 DIAGNOSIS — N186 End stage renal disease: Secondary | ICD-10-CM | POA: Diagnosis not present

## 2023-07-31 ENCOUNTER — Other Ambulatory Visit: Payer: Self-pay

## 2023-07-31 ENCOUNTER — Emergency Department
Admission: EM | Admit: 2023-07-31 | Discharge: 2023-07-31 | Discharge status: Against Medical Advice | Attending: Emergency Medicine | Admitting: Emergency Medicine

## 2023-07-31 ENCOUNTER — Emergency Department
Admission: EM | Admit: 2023-07-31 | Discharge: 2023-07-31 | Disposition: A | Discharge status: Home / Self Care | Attending: Emergency Medicine | Admitting: Emergency Medicine

## 2023-07-31 DIAGNOSIS — X58XXXA Exposure to other specified factors, initial encounter: Secondary | ICD-10-CM | POA: Insufficient documentation

## 2023-07-31 DIAGNOSIS — T148XXA Other injury of unspecified body region, initial encounter: Secondary | ICD-10-CM

## 2023-07-31 DIAGNOSIS — K068 Other specified disorders of gingiva and edentulous alveolar ridge: Secondary | ICD-10-CM | POA: Insufficient documentation

## 2023-07-31 DIAGNOSIS — L7622 Postprocedural hemorrhage and hematoma of skin and subcutaneous tissue following other procedure: Secondary | ICD-10-CM | POA: Insufficient documentation

## 2023-07-31 DIAGNOSIS — Z5321 Procedure and treatment not carried out due to patient leaving prior to being seen by health care provider: Secondary | ICD-10-CM | POA: Diagnosis not present

## 2023-07-31 DIAGNOSIS — S0993XA Unspecified injury of face, initial encounter: Secondary | ICD-10-CM | POA: Diagnosis present

## 2023-07-31 DIAGNOSIS — S01511A Laceration without foreign body of lip, initial encounter: Secondary | ICD-10-CM | POA: Insufficient documentation

## 2023-07-31 DIAGNOSIS — Z85828 Personal history of other malignant neoplasm of skin: Secondary | ICD-10-CM | POA: Insufficient documentation

## 2023-07-31 DIAGNOSIS — Z992 Dependence on renal dialysis: Secondary | ICD-10-CM | POA: Insufficient documentation

## 2023-07-31 DIAGNOSIS — N186 End stage renal disease: Secondary | ICD-10-CM | POA: Insufficient documentation

## 2023-07-31 DIAGNOSIS — I4891 Unspecified atrial fibrillation: Secondary | ICD-10-CM | POA: Insufficient documentation

## 2023-07-31 DIAGNOSIS — Z7901 Long term (current) use of anticoagulants: Secondary | ICD-10-CM | POA: Diagnosis not present

## 2023-07-31 DIAGNOSIS — T8133XA Disruption of traumatic injury wound repair, initial encounter: Secondary | ICD-10-CM | POA: Diagnosis not present

## 2023-07-31 LAB — CBC WITH DIFFERENTIAL/PLATELET
Abs Immature Granulocytes: 0.02 10*3/uL (ref 0.00–0.07)
Basophils Absolute: 0.1 10*3/uL (ref 0.0–0.1)
Basophils Relative: 1 %
Eosinophils Absolute: 0.5 10*3/uL (ref 0.0–0.5)
Eosinophils Relative: 9 %
HCT: 32.1 % — ABNORMAL LOW (ref 39.0–52.0)
Hemoglobin: 10.4 g/dL — ABNORMAL LOW (ref 13.0–17.0)
Immature Granulocytes: 0 %
Lymphocytes Relative: 15 %
Lymphs Abs: 0.8 10*3/uL (ref 0.7–4.0)
MCH: 32.7 pg (ref 26.0–34.0)
MCHC: 32.4 g/dL (ref 30.0–36.0)
MCV: 100.9 fL — ABNORMAL HIGH (ref 80.0–100.0)
Monocytes Absolute: 0.7 10*3/uL (ref 0.1–1.0)
Monocytes Relative: 12 %
Neutro Abs: 3.5 10*3/uL (ref 1.7–7.7)
Neutrophils Relative %: 63 %
Platelets: 105 10*3/uL — ABNORMAL LOW (ref 150–400)
RBC: 3.18 MIL/uL — ABNORMAL LOW (ref 4.22–5.81)
RDW: 16.9 % — ABNORMAL HIGH (ref 11.5–15.5)
WBC: 5.5 10*3/uL (ref 4.0–10.5)
nRBC: 0 % (ref 0.0–0.2)

## 2023-07-31 LAB — CBC
HCT: 27.1 % — ABNORMAL LOW (ref 39.0–52.0)
Hemoglobin: 8.8 g/dL — ABNORMAL LOW (ref 13.0–17.0)
MCH: 33.1 pg (ref 26.0–34.0)
MCHC: 32.5 g/dL (ref 30.0–36.0)
MCV: 101.9 fL — ABNORMAL HIGH (ref 80.0–100.0)
Platelets: 146 10*3/uL — ABNORMAL LOW (ref 150–400)
RBC: 2.66 MIL/uL — ABNORMAL LOW (ref 4.22–5.81)
RDW: 16.8 % — ABNORMAL HIGH (ref 11.5–15.5)
WBC: 7 10*3/uL (ref 4.0–10.5)
nRBC: 0 % (ref 0.0–0.2)

## 2023-07-31 LAB — BASIC METABOLIC PANEL
Anion gap: 12 (ref 5–15)
BUN: 45 mg/dL — ABNORMAL HIGH (ref 8–23)
CO2: 25 mmol/L (ref 22–32)
Calcium: 8.6 mg/dL — ABNORMAL LOW (ref 8.9–10.3)
Chloride: 102 mmol/L (ref 98–111)
Creatinine, Ser: 6.32 mg/dL — ABNORMAL HIGH (ref 0.61–1.24)
GFR, Estimated: 8 mL/min — ABNORMAL LOW (ref >60)
Glucose, Bld: 92 mg/dL (ref 70–99)
Potassium: 4.5 mmol/L (ref 3.5–5.1)
Sodium: 139 mmol/L (ref 135–145)

## 2023-07-31 LAB — COMPREHENSIVE METABOLIC PANEL
ALT: 27 U/L (ref 0–44)
AST: 40 U/L (ref 15–41)
Albumin: 2.9 g/dL — ABNORMAL LOW (ref 3.5–5.0)
Alkaline Phosphatase: 121 U/L (ref 38–126)
Anion gap: 16 — ABNORMAL HIGH (ref 5–15)
BUN: 46 mg/dL — ABNORMAL HIGH (ref 8–23)
CO2: 19 mmol/L — ABNORMAL LOW (ref 22–32)
Calcium: 8.1 mg/dL — ABNORMAL LOW (ref 8.9–10.3)
Chloride: 104 mmol/L (ref 98–111)
Creatinine, Ser: 6.52 mg/dL — ABNORMAL HIGH (ref 0.61–1.24)
GFR, Estimated: 8 mL/min — ABNORMAL LOW (ref >60)
Glucose, Bld: 114 mg/dL — ABNORMAL HIGH (ref 70–99)
Potassium: 4.4 mmol/L (ref 3.5–5.1)
Sodium: 139 mmol/L (ref 135–145)
Total Bilirubin: 1.4 mg/dL — ABNORMAL HIGH (ref 0.0–1.2)
Total Protein: 5.4 g/dL — ABNORMAL LOW (ref 6.5–8.1)

## 2023-07-31 LAB — PROTIME-INR
INR: 2 — ABNORMAL HIGH (ref 0.8–1.2)
Prothrombin Time: 22.5 s — ABNORMAL HIGH (ref 11.4–15.2)

## 2023-07-31 LAB — TYPE AND SCREEN
ABO/RH(D): O POS
Antibody Screen: NEGATIVE

## 2023-07-31 MED ORDER — LIDOCAINE-EPINEPHRINE (PF) 2 %-1:200000 IJ SOLN
INTRAMUSCULAR | Status: AC
  Administered 2023-07-31: 20 mL
  Filled 2023-07-31: qty 20

## 2023-07-31 MED ORDER — LACTATED RINGERS IV BOLUS
500.0000 mL | Freq: Once | INTRAVENOUS | Status: AC
  Administered 2023-07-31: 500 mL via INTRAVENOUS

## 2023-07-31 MED ORDER — TRANEXAMIC ACID FOR EPISTAXIS
500.0000 mg | Freq: Once | TOPICAL | Status: AC
  Administered 2023-07-31: 500 mg via TOPICAL
  Filled 2023-07-31: qty 5

## 2023-07-31 NOTE — ED Provider Notes (Signed)
 Ronald [name] Provider Note    Event Date/Time   First MD Initiated Contact with Patient 07/31/23 1202     (approximate)   History   Bleeding/Bruising   HPI  Ronald [name] is a 78 y.o. male with history of atrial fibrillation on Eliquis, ESRD on dialysis, presenting with bleeding from his inner lip.  Per wife this has been ongoing for the past week.  Has been seen twice in the emergency department for this.  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  She thinks that he may have bitten his inner lip last week and that is when the bleeding started.  Per wife his last Eliquis was taken yesterday night.  States that bleeding recurred yesterday night.  He does not know if he bit that area again.  He is due for dialysis tomorrow.   On independent chart review he was seen in the emergency department on 21st and 22nd for bleeding from the lip.  On the first visit, puncture wound was noted on the right upper lip that does not go past the vermilion border, it aligns with the lower incisor when he closes mouth.  Figure-of-eight suture was done with resolution of the bleeding.  On his second visit, blood clots were noted at the suture site.  Let was applied and he was discharged with primary care follow-up.  He was seen by his internal medicine doctor on the 25th, no bleeding was noted to the inner lip, the stitch was intact.  Physical Exam   Triage Vital Signs: ED Triage Vitals  Encounter Vitals Group     BP 07/31/23 1130 (!) 82/46     Systolic BP Percentile --      Diastolic BP Percentile --      Pulse Rate 07/31/23 1213 100     Resp 07/31/23 1213 20     Temp 07/31/23 1213 98.4 F (36.9 C)     Temp src --      SpO2 07/31/23 1213 99 %     Weight 07/31/23 1115 203 lb (92.1 kg)     Height 07/31/23 1115 5\' 9"  (1.753 m)     Head Circumference --      Peak Flow --      Pain Score 07/31/23 1115 7     Pain  Loc --      Pain Education --      Exclude from Growth Chart --     Most recent vital signs: Vitals:   07/31/23 1213 07/31/23 1300  BP:  (!) 98/57  Pulse: 100 98  Resp: 20   Temp: 98.4 F (36.9 C)   SpO2: 99% 99%     General: Awake, no distress.  CV:  Good peripheral perfusion.  Resp:  Normal effort.  Abd:  No distention.  Other:  He has blood and clots in his mouth that was suctioned out, there is an area of continuous bleeding from a macerated area on his inner lip where he had bitten a week ago.  No sutures seen.  The puncture site from his initial initial wound is still open.   ED Results / Procedures / Treatments   Labs (all labs ordered are listed, but only abnormal results are displayed) Labs Reviewed  CBC - Abnormal; Notable for the following components:      Result Value   RBC 2.66 (*)  Hemoglobin 8.8 (*)    HCT 27.1 (*)    MCV 101.9 (*)    RDW 16.8 (*)    Platelets 146 (*)    All other components within normal limits  COMPREHENSIVE METABOLIC PANEL - Abnormal; Notable for the following components:   CO2 19 (*)    Glucose, Bld 114 (*)    BUN 46 (*)    Creatinine, Ser 6.52 (*)    Calcium 8.1 (*)    Total Protein 5.4 (*)    Albumin 2.9 (*)    Total Bilirubin 1.4 (*)    GFR, Estimated 8 (*)    Anion gap 16 (*)    All other components within normal limits  PROTIME-INR - Abnormal; Notable for the following components:   Prothrombin Time 22.5 (*)    INR 2.0 (*)    All other components within normal limits  TYPE AND SCREEN      PROCEDURES:  Critical Care performed: No  .Laceration Repair  Date/Time: 07/31/2023 12:39 PM  Performed by: Claybon Jabs, MD Authorized by: Claybon Jabs, MD   Laceration details:    Location:  Lip   Length (cm):  0.5 Exploration:    Hemostasis achieved with:  Direct pressure and epinephrine   Wound extent comment:  There is a puncture wound with an area of macerated tissue in the inner lip Treatment:    Visualized  foreign bodies/material removed: no   Skin repair:    Repair method:  Sutures   Suture size:  3-0   Suture material:  Nylon   Suture technique:  Horizontal mattress and simple interrupted   Number of sutures:  2 Repair type:    Repair type:  Intermediate Post-procedure details:    Dressing:  Open (no dressing)   Procedure completion:  Tolerated well, no immediate complications    MEDICATIONS ORDERED IN ED: Medications  tranexamic acid (CYKLOKAPRON) 1000 MG/10ML topical solution 500 mg (500 mg Topical Given 07/31/23 1150)  lidocaine-EPINEPHrine (XYLOCAINE W/EPI) 2 %-1:200000 (PF) injection (20 mLs  Given by Other 07/31/23 1155)  lactated ringers bolus 500 mL (500 mLs Intravenous New Bag/Given 07/31/23 1328)     IMPRESSION / MDM / ASSESSMENT AND PLAN / ED COURSE  I reviewed the triage vital signs and the nursing notes.                              Differential diagnosis includes, but is not limited to, bleeding from his lip laceration, there is a macerated area of tissue on the inner lip that is continuously bleeding, could be a ruptured blister versus patient biting his lip again without knowing.  Will get labs.  TXA soaked gauze was applied to the site that is bleeding, it helped to slow the bleeding down but site is still using continuously.  Injected about 3 cc of 2% lidocaine with epi in the areas around the bleeding site.  Put 1 horizontal mattress through the lacerated area and over the macerated site.  Put another simple interrupted stitch over the macerated site and tied it down.  On reexam, bleeding has stopped.  Patient's presentation is most consistent with acute presentation with potential threat to life or bodily function.  Independent review of labs are below.  Patient's blood pressures are 100-1 teens.  He was sleeping when I went to the room.  On multiple reassessment, no additional bleeding.  Shared decision making done with patient and wife,  recommended that she buy a full  mouthguard for him when he sleeping so he does not actually puncture the area again.  Instructed him to follow-up with his primary care doctor to get sutures removed after 5 days.  Instructed him to hold his Eliquis today and call his cardiologist to see when he should restart it.  Considered but no indication for inpatient admission at this time, he is safe for outpatient management.  Will discharge with strict precautions.  Clinical Course as of 07/31/23 1421  Sun Jul 31, 2023  1341 On independent chart review, electrolytes are not severely deranged, his creatinine is elevated but he is not dialysis.  His INR is 2.  No leukocytosis, his hemoglobin is 8.8, down from 10.4 and extends given the bleeding. [TT]  1342 On reassessment, he is not bleeding anymore.  Getting some IV fluids. [TT]  1419 On reassessment patient is not bleeding anymore.  He was observed in the ED without recurrence.  Repeat blood pressures were systolics in the 110s. [TT]    Clinical Course User Index [TT] Jodie Echevaria, Franchot Erichsen, MD     FINAL CLINICAL IMPRESSION(S) / ED DIAGNOSES   Final diagnoses:  Bleeding from wound  On anticoagulant therapy     Rx / DC Orders   ED Discharge Orders     None        Note:  This document was prepared using Dragon voice recognition software and may include unintentional dictation errors.    Claybon Jabs, MD 07/31/23 [phone]

## 2023-07-31 NOTE — ED Triage Notes (Signed)
 Pt arrives with c/o oral bleeding that has been intermittent over the past week. Pt does take eliquis. 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 Per wife, pts mouth has been bleeding since 17:00 yesterday evening. Pt endorses oral pain.

## 2023-07-31 NOTE — Discharge Instructions (Addendum)
 Please hold your Eliquis today and call your cardiologist to see when to restart it.  You can go back to your dermatologist to get your lips checked and get your sutures removed after 5 days.  You can also follow-up with your primary care doctor to get your sutures removed between 5 to 7 days.  You can use a mouth guard when you are sleeping to prevent yourself from accidentally biting on your lip again.

## 2023-07-31 NOTE — ED Triage Notes (Signed)
 Pt arrives with c/o oral bleeding that has been intermittent over the past week. Pt does take eliquis. 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 Per wife, pts mouth has been bleeding since 17:00 Friday evening. Pt endorses oral pain.

## 2023-08-01 ENCOUNTER — Encounter: Payer: Self-pay | Admitting: Internal Medicine

## 2023-08-01 ENCOUNTER — Telehealth: Payer: Self-pay

## 2023-08-01 NOTE — Transitions of Care (Post Inpatient/ED Visit) (Signed)
 08/01/2023  Name: [name] MRN: [id number] DOB: Dec 03, 1945  Today's TOC FU Call Status: Today's TOC FU Call Status:: Successful TOC FU Call Completed TOC FU Call Complete Date: 08/01/23 Patient's Name and Date of Birth confirmed.  Transition Care Management Follow-up Telephone Call Date of Discharge: 07/31/23 Discharge Facility: [facility]) Type of Discharge: Emergency Department Reason for ED Visit: Other: How have you been since you were released from the hospital?: Better Any questions or concerns?: No  Items Reviewed: Did you receive and understand the discharge instructions provided?: Yes Medications obtained,verified, and reconciled?: Yes (Medications Reviewed) Any new allergies since your discharge?: No Dietary orders reviewed?: Yes Do you have support at home?: Yes People in Home: spouse  Medications Reviewed Today: Medications Reviewed Today     Reviewed by Karena Addison, LPN (Licensed Practical Nurse) on 08/01/23 at 1213  Med List Status: <None>   Medication Order Taking? Sig Documenting Provider Last Dose Status Informant  acetaminophen (TYLENOL) 650 MG CR tablet [id number] No Take 1,300 mg by mouth at bedtime. [provider] Taking Active Spouse/Significant Other  allopurinol (ZYLOPRIM) 100 MG tablet [id number] No TAKE 1 TABLET BY MOUTH EVERYDAY AT BEDTIME Reubin Milan, MD Taking Active   amiodarone (PACERONE) 200 MG tablet [id number] No TAKE 1 TABLET BY MOUTH EVERY DAY Kathleene Hazel, MD Taking Active   atorvastatin (LIPITOR) 40 MG tablet [id number] No TAKE 1 TABLET BY MOUTH EVERYDAY AT BEDTIME Reubin Milan, MD Taking Active   Cholecalciferol (VITAMIN D-3) 125 MCG (5000 UT) TABS [id number] No Take 2,000 Units by mouth daily.  [provider] Taking Active Spouse/Significant Other  diphenhydrAMINE (BENADRYL) 50 MG capsule [id number] No Take 50 mg by mouth at bedtime. [provider] Taking  Active   ELIQUIS 5 MG TABS tablet [id number] No TAKE 1 TABLET BY MOUTH TWICE A DAY McAlhany, Nile Dear, MD Taking Active   furosemide (LASIX) 40 MG tablet [id number] No Take 40 mg by mouth daily as needed for edema. Taking 1 1/2 tablets (60mg  total) Pt takes on Tue. Thur. Sat.and Sunday's. [provider] Taking Active   levothyroxine (SYNTHROID) 75 MCG tablet [id number] No TAKE 1 TABLET BY MOUTH EVERY DAY BEFORE BREAKFAST Reubin Milan, MD Taking Active   metoprolol succinate (TOPROL XL) 100 MG 24 hr tablet [id number] No Take 1 tablet (100 mg total) by mouth daily. Take with or immediately following a meal. Romie Minus, MD Taking Active   Multiple Vitamin (MULTIVITAMIN PO) [id number] No Take 1 tablet by mouth daily. [provider] Taking Active Spouse/Significant Other  nitroGLYCERIN (NITROSTAT) 0.4 MG SL tablet [id number] No Place 0.4 mg under the tongue every 5 (five) minutes as needed for chest pain (Up to 3 times). [provider] Taking Active Spouse/Significant Other           Med Note Truman Hayward   Thu Jul 15, 2022 12:17 PM)    pantoprazole (PROTONIX) 40 MG tablet [id number] No Take 1 tablet (40 mg total) by mouth 2 (two) times daily. Home med. Reubin Milan, MD Taking Active   senna (SENOKOT) 8.6 MG tablet [id number] No Take 1 tablet by mouth as needed for constipation. [provider] Taking Active             Home Care and Equipment/Supplies: Were Home Health Services Ordered?: NA Any new equipment or medical supplies ordered?: NA  Functional Questionnaire: Do you need assistance with bathing/showering or dressing?: No Do you need assistance  with meal preparation?: No Do you need assistance with eating?: No Do you have difficulty maintaining continence: No Do you need assistance with getting out of bed/getting out of a chair/moving?: No Do you have difficulty managing or taking your medications?: No  Follow up appointments  reviewed: PCP Follow-up appointment confirmed?: No (declined appt) MD Provider Line Number:[phone] Given: No Specialist Hospital Follow-up appointment confirmed?: NA Do you need transportation to your follow-up appointment?: No Do you understand care options if your condition(s) worsen?: Yes-patient verbalized understanding    SIGNATURE Karena Addison, LPN [facility] Nurse Health Advisor Direct Dial [phone]

## 2023-08-02 ENCOUNTER — Encounter: Payer: Self-pay | Admitting: Cardiovascular Disease

## 2023-08-02 NOTE — Telephone Encounter (Signed)
 Per ER note on 07/31/23:  Independent review of labs are below. Patient's blood pressures are 100-1 teens. He was sleeping when I went to the room. On multiple reassessment, no additional bleeding. Shared decision making done with patient and wife, recommended that she buy a full mouthguard for him when he sleeping so he does not actually puncture the area again. Instructed him to follow-up with his primary care doctor to get sutures removed after 5 days. Instructed him to hold his Eliquis today and call his cardiologist to see when he should restart it. Considered but no indication for inpatient admission at this time, he is safe for outpatient management. Will discharge with strict precautions.

## 2023-08-03 DIAGNOSIS — Z992 Dependence on renal dialysis: Secondary | ICD-10-CM | POA: Diagnosis not present

## 2023-08-03 DIAGNOSIS — N186 End stage renal disease: Secondary | ICD-10-CM | POA: Diagnosis not present

## 2023-08-05 ENCOUNTER — Emergency Department

## 2023-08-05 ENCOUNTER — Inpatient Hospital Stay
Admission: EM | Admit: 2023-08-05 | Discharge: 2023-08-09 | DRG: 811 | Disposition: A | Discharge status: Home Health | Attending: Obstetrics and Gynecology | Admitting: Obstetrics and Gynecology

## 2023-08-05 ENCOUNTER — Other Ambulatory Visit: Payer: Self-pay

## 2023-08-05 DIAGNOSIS — I495 Sick sinus syndrome: Secondary | ICD-10-CM

## 2023-08-05 DIAGNOSIS — E1151 Type 2 diabetes mellitus with diabetic peripheral angiopathy without gangrene: Secondary | ICD-10-CM | POA: Diagnosis present

## 2023-08-05 DIAGNOSIS — J9811 Atelectasis: Secondary | ICD-10-CM | POA: Diagnosis present

## 2023-08-05 DIAGNOSIS — I959 Hypotension, unspecified: Secondary | ICD-10-CM

## 2023-08-05 DIAGNOSIS — R571 Hypovolemic shock: Secondary | ICD-10-CM | POA: Diagnosis not present

## 2023-08-05 DIAGNOSIS — Z9079 Acquired absence of other genital organ(s): Secondary | ICD-10-CM

## 2023-08-05 DIAGNOSIS — N186 End stage renal disease: Secondary | ICD-10-CM

## 2023-08-05 DIAGNOSIS — Z794 Long term (current) use of insulin: Secondary | ICD-10-CM | POA: Diagnosis not present

## 2023-08-05 DIAGNOSIS — Z888 Allergy status to other drugs, medicaments and biological substances status: Secondary | ICD-10-CM

## 2023-08-05 DIAGNOSIS — N2581 Secondary hyperparathyroidism of renal origin: Secondary | ICD-10-CM | POA: Diagnosis not present

## 2023-08-05 DIAGNOSIS — I255 Ischemic cardiomyopathy: Secondary | ICD-10-CM | POA: Diagnosis present

## 2023-08-05 DIAGNOSIS — Z79899 Other long term (current) drug therapy: Secondary | ICD-10-CM

## 2023-08-05 DIAGNOSIS — I953 Hypotension of hemodialysis: Secondary | ICD-10-CM | POA: Diagnosis present

## 2023-08-05 DIAGNOSIS — D696 Thrombocytopenia, unspecified: Secondary | ICD-10-CM | POA: Diagnosis present

## 2023-08-05 DIAGNOSIS — I2510 Atherosclerotic heart disease of native coronary artery without angina pectoris: Secondary | ICD-10-CM | POA: Diagnosis present

## 2023-08-05 DIAGNOSIS — I132 Hypertensive heart and chronic kidney disease with heart failure and with stage 5 chronic kidney disease, or end stage renal disease: Secondary | ICD-10-CM | POA: Diagnosis not present

## 2023-08-05 DIAGNOSIS — I420 Dilated cardiomyopathy: Secondary | ICD-10-CM | POA: Diagnosis present

## 2023-08-05 DIAGNOSIS — G8929 Other chronic pain: Secondary | ICD-10-CM | POA: Diagnosis present

## 2023-08-05 DIAGNOSIS — I480 Paroxysmal atrial fibrillation: Secondary | ICD-10-CM | POA: Diagnosis present

## 2023-08-05 DIAGNOSIS — X58XXXA Exposure to other specified factors, initial encounter: Secondary | ICD-10-CM | POA: Diagnosis present

## 2023-08-05 DIAGNOSIS — E782 Mixed hyperlipidemia: Secondary | ICD-10-CM | POA: Diagnosis not present

## 2023-08-05 DIAGNOSIS — I120 Hypertensive chronic kidney disease with stage 5 chronic kidney disease or end stage renal disease: Secondary | ICD-10-CM | POA: Diagnosis not present

## 2023-08-05 DIAGNOSIS — M479 Spondylosis, unspecified: Secondary | ICD-10-CM | POA: Diagnosis present

## 2023-08-05 DIAGNOSIS — Z951 Presence of aortocoronary bypass graft: Secondary | ICD-10-CM

## 2023-08-05 DIAGNOSIS — E1122 Type 2 diabetes mellitus with diabetic chronic kidney disease: Secondary | ICD-10-CM | POA: Diagnosis present

## 2023-08-05 DIAGNOSIS — I714 Abdominal aortic aneurysm, without rupture, unspecified: Secondary | ICD-10-CM | POA: Diagnosis present

## 2023-08-05 DIAGNOSIS — D649 Anemia, unspecified: Secondary | ICD-10-CM | POA: Diagnosis not present

## 2023-08-05 DIAGNOSIS — R55 Syncope and collapse: Secondary | ICD-10-CM | POA: Diagnosis not present

## 2023-08-05 DIAGNOSIS — R42 Dizziness and giddiness: Secondary | ICD-10-CM | POA: Diagnosis present

## 2023-08-05 DIAGNOSIS — Z7901 Long term (current) use of anticoagulants: Secondary | ICD-10-CM

## 2023-08-05 DIAGNOSIS — M160 Bilateral primary osteoarthritis of hip: Secondary | ICD-10-CM | POA: Diagnosis present

## 2023-08-05 DIAGNOSIS — Z8249 Family history of ischemic heart disease and other diseases of the circulatory system: Secondary | ICD-10-CM

## 2023-08-05 DIAGNOSIS — Z85828 Personal history of other malignant neoplasm of skin: Secondary | ICD-10-CM

## 2023-08-05 DIAGNOSIS — I5022 Chronic systolic (congestive) heart failure: Secondary | ICD-10-CM | POA: Diagnosis not present

## 2023-08-05 DIAGNOSIS — I2582 Chronic total occlusion of coronary artery: Secondary | ICD-10-CM | POA: Diagnosis present

## 2023-08-05 DIAGNOSIS — E1140 Type 2 diabetes mellitus with diabetic neuropathy, unspecified: Secondary | ICD-10-CM | POA: Diagnosis present

## 2023-08-05 DIAGNOSIS — I499 Cardiac arrhythmia, unspecified: Secondary | ICD-10-CM | POA: Diagnosis not present

## 2023-08-05 DIAGNOSIS — N401 Enlarged prostate with lower urinary tract symptoms: Secondary | ICD-10-CM | POA: Diagnosis present

## 2023-08-05 DIAGNOSIS — I701 Atherosclerosis of renal artery: Secondary | ICD-10-CM | POA: Diagnosis present

## 2023-08-05 DIAGNOSIS — J439 Emphysema, unspecified: Secondary | ICD-10-CM | POA: Diagnosis present

## 2023-08-05 DIAGNOSIS — R404 Transient alteration of awareness: Secondary | ICD-10-CM | POA: Diagnosis not present

## 2023-08-05 DIAGNOSIS — Z9181 History of falling: Secondary | ICD-10-CM

## 2023-08-05 DIAGNOSIS — Z885 Allergy status to narcotic agent status: Secondary | ICD-10-CM

## 2023-08-05 DIAGNOSIS — I4891 Unspecified atrial fibrillation: Secondary | ICD-10-CM | POA: Diagnosis not present

## 2023-08-05 DIAGNOSIS — R6889 Other general symptoms and signs: Secondary | ICD-10-CM | POA: Diagnosis not present

## 2023-08-05 DIAGNOSIS — Z555 Less than a high school diploma: Secondary | ICD-10-CM

## 2023-08-05 DIAGNOSIS — Z8701 Personal history of pneumonia (recurrent): Secondary | ICD-10-CM

## 2023-08-05 DIAGNOSIS — Z992 Dependence on renal dialysis: Secondary | ICD-10-CM

## 2023-08-05 DIAGNOSIS — D631 Anemia in chronic kidney disease: Secondary | ICD-10-CM | POA: Diagnosis present

## 2023-08-05 DIAGNOSIS — I252 Old myocardial infarction: Secondary | ICD-10-CM

## 2023-08-05 DIAGNOSIS — K219 Gastro-esophageal reflux disease without esophagitis: Secondary | ICD-10-CM | POA: Diagnosis present

## 2023-08-05 DIAGNOSIS — Z880 Allergy status to penicillin: Secondary | ICD-10-CM

## 2023-08-05 DIAGNOSIS — E039 Hypothyroidism, unspecified: Secondary | ICD-10-CM | POA: Diagnosis not present

## 2023-08-05 DIAGNOSIS — D62 Acute posthemorrhagic anemia: Secondary | ICD-10-CM

## 2023-08-05 DIAGNOSIS — R54 Age-related physical debility: Secondary | ICD-10-CM | POA: Diagnosis present

## 2023-08-05 DIAGNOSIS — Z83438 Family history of other disorder of lipoprotein metabolism and other lipidemia: Secondary | ICD-10-CM

## 2023-08-05 DIAGNOSIS — M48061 Spinal stenosis, lumbar region without neurogenic claudication: Secondary | ICD-10-CM | POA: Diagnosis present

## 2023-08-05 DIAGNOSIS — R918 Other nonspecific abnormal finding of lung field: Secondary | ICD-10-CM | POA: Diagnosis not present

## 2023-08-05 DIAGNOSIS — I9589 Other hypotension: Secondary | ICD-10-CM

## 2023-08-05 DIAGNOSIS — Z87891 Personal history of nicotine dependence: Secondary | ICD-10-CM

## 2023-08-05 DIAGNOSIS — H9192 Unspecified hearing loss, left ear: Secondary | ICD-10-CM | POA: Diagnosis present

## 2023-08-05 DIAGNOSIS — G4733 Obstructive sleep apnea (adult) (pediatric): Secondary | ICD-10-CM | POA: Diagnosis not present

## 2023-08-05 DIAGNOSIS — J90 Pleural effusion, not elsewhere classified: Secondary | ICD-10-CM | POA: Diagnosis not present

## 2023-08-05 DIAGNOSIS — Z79890 Hormone replacement therapy (postmenopausal): Secondary | ICD-10-CM

## 2023-08-05 DIAGNOSIS — Z882 Allergy status to sulfonamides status: Secondary | ICD-10-CM

## 2023-08-05 DIAGNOSIS — I5023 Acute on chronic systolic (congestive) heart failure: Secondary | ICD-10-CM | POA: Diagnosis present

## 2023-08-05 DIAGNOSIS — J449 Chronic obstructive pulmonary disease, unspecified: Secondary | ICD-10-CM | POA: Diagnosis not present

## 2023-08-05 DIAGNOSIS — R7989 Other specified abnormal findings of blood chemistry: Secondary | ICD-10-CM | POA: Diagnosis not present

## 2023-08-05 DIAGNOSIS — Z881 Allergy status to other antibiotic agents status: Secondary | ICD-10-CM

## 2023-08-05 DIAGNOSIS — S01511A Laceration without foreign body of lip, initial encounter: Secondary | ICD-10-CM | POA: Diagnosis present

## 2023-08-05 DIAGNOSIS — Z955 Presence of coronary angioplasty implant and graft: Secondary | ICD-10-CM

## 2023-08-05 DIAGNOSIS — R0902 Hypoxemia: Secondary | ICD-10-CM | POA: Diagnosis not present

## 2023-08-05 DIAGNOSIS — E119 Type 2 diabetes mellitus without complications: Secondary | ICD-10-CM | POA: Diagnosis not present

## 2023-08-05 HISTORY — DX: Acute posthemorrhagic anemia: D62

## 2023-08-05 HISTORY — DX: Paroxysmal atrial fibrillation: I48.0

## 2023-08-05 HISTORY — DX: Syncope and collapse: R55

## 2023-08-05 LAB — COMPREHENSIVE METABOLIC PANEL
ALT: 26 U/L (ref 0–44)
AST: 39 U/L (ref 15–41)
Albumin: 2.9 g/dL — ABNORMAL LOW (ref 3.5–5.0)
Alkaline Phosphatase: 121 U/L (ref 38–126)
Anion gap: 13 (ref 5–15)
BUN: 23 mg/dL (ref 8–23)
CO2: 23 mmol/L (ref 22–32)
Calcium: 7.7 mg/dL — ABNORMAL LOW (ref 8.9–10.3)
Chloride: 100 mmol/L (ref 98–111)
Creatinine, Ser: 3.79 mg/dL — ABNORMAL HIGH (ref 0.61–1.24)
GFR, Estimated: 15 mL/min — ABNORMAL LOW (ref >60)
Glucose, Bld: 132 mg/dL — ABNORMAL HIGH (ref 70–99)
Potassium: 3.8 mmol/L (ref 3.5–5.1)
Sodium: 136 mmol/L (ref 135–145)
Total Bilirubin: 1 mg/dL (ref 0.0–1.2)
Total Protein: 5.7 g/dL — ABNORMAL LOW (ref 6.5–8.1)

## 2023-08-05 LAB — CBC WITH DIFFERENTIAL/PLATELET
Abs Immature Granulocytes: 0.02 10*3/uL (ref 0.00–0.07)
Basophils Absolute: 0 10*3/uL (ref 0.0–0.1)
Basophils Relative: 1 %
Eosinophils Absolute: 0.3 10*3/uL (ref 0.0–0.5)
Eosinophils Relative: 5 %
HCT: 22.6 % — ABNORMAL LOW (ref 39.0–52.0)
Hemoglobin: 7.2 g/dL — ABNORMAL LOW (ref 13.0–17.0)
Immature Granulocytes: 0 %
Lymphocytes Relative: 13 %
Lymphs Abs: 0.8 10*3/uL (ref 0.7–4.0)
MCH: 33.3 pg (ref 26.0–34.0)
MCHC: 31.9 g/dL (ref 30.0–36.0)
MCV: 104.6 fL — ABNORMAL HIGH (ref 80.0–100.0)
Monocytes Absolute: 0.5 10*3/uL (ref 0.1–1.0)
Monocytes Relative: 10 %
Neutro Abs: 4.1 10*3/uL (ref 1.7–7.7)
Neutrophils Relative %: 71 %
Platelets: 117 10*3/uL — ABNORMAL LOW (ref 150–400)
RBC: 2.16 MIL/uL — ABNORMAL LOW (ref 4.22–5.81)
RDW: 17 % — ABNORMAL HIGH (ref 11.5–15.5)
WBC: 5.7 10*3/uL (ref 4.0–10.5)
nRBC: 0 % (ref 0.0–0.2)

## 2023-08-05 LAB — PREPARE RBC (CROSSMATCH)

## 2023-08-05 LAB — CBG MONITORING, ED
Glucose-Capillary: 125 mg/dL — ABNORMAL HIGH (ref 70–99)
Glucose-Capillary: 127 mg/dL — ABNORMAL HIGH (ref 70–99)

## 2023-08-05 LAB — APTT: aPTT: 35 s (ref 24–36)

## 2023-08-05 LAB — LACTIC ACID, PLASMA
Lactic Acid, Venous: 2.5 mmol/L (ref 0.5–1.9)
Lactic Acid, Venous: 2.7 mmol/L (ref 0.5–1.9)

## 2023-08-05 LAB — PROTIME-INR
INR: 1.5 — ABNORMAL HIGH (ref 0.8–1.2)
Prothrombin Time: 18.7 s — ABNORMAL HIGH (ref 11.4–15.2)

## 2023-08-05 LAB — TROPONIN I (HIGH SENSITIVITY)
Troponin I (High Sensitivity): 71 ng/L — ABNORMAL HIGH (ref <18)
Troponin I (High Sensitivity): 67 ng/L — ABNORMAL HIGH (ref <18)

## 2023-08-05 MED ORDER — PANTOPRAZOLE SODIUM 40 MG PO TBEC: 40.0000 mg | DELAYED_RELEASE_TABLET | Freq: Two times a day (BID) | ORAL | Status: DC

## 2023-08-05 MED ORDER — FENTANYL CITRATE PF 50 MCG/ML IJ SOSY
25.0000 ug | PREFILLED_SYRINGE | INTRAMUSCULAR | Status: DC | PRN
  Administered 2023-08-05: 25 ug via INTRAVENOUS
  Filled 2023-08-05: qty 1

## 2023-08-05 MED ORDER — ACETAMINOPHEN 325 MG PO TABS: 650.0000 mg | ORAL_TABLET | Freq: Four times a day (QID) | ORAL | Status: DC | PRN

## 2023-08-05 MED ORDER — ACETAMINOPHEN 325 MG PO TABS
650.0000 mg | ORAL_TABLET | Freq: Once | ORAL | Status: AC
  Administered 2023-08-05: 650 mg via ORAL
  Filled 2023-08-05: qty 2

## 2023-08-05 MED ORDER — SODIUM CHLORIDE 0.9 % IV SOLN
10.0000 mL/h | Freq: Once | INTRAVENOUS | Status: AC
  Administered 2023-08-05: 10 mL/h via INTRAVENOUS

## 2023-08-05 MED ORDER — SENNA 8.6 MG PO TABS: 1.0000 | ORAL_TABLET | ORAL | Status: DC | PRN

## 2023-08-05 MED ORDER — ADULT MULTIVITAMIN W/MINERALS CH: 1.0000 | ORAL_TABLET | Freq: Every day | ORAL | Status: DC

## 2023-08-05 MED ORDER — ACETAMINOPHEN 325 MG RE SUPP: 650.0000 mg | Freq: Four times a day (QID) | RECTAL | Status: DC | PRN

## 2023-08-05 MED ORDER — MAGNESIUM HYDROXIDE 400 MG/5ML PO SUSP: 30.0000 mL | Freq: Every day | ORAL | Status: DC | PRN

## 2023-08-05 MED ORDER — AMIODARONE HCL 200 MG PO TABS: 200.0000 mg | ORAL_TABLET | Freq: Every day | ORAL | Status: DC

## 2023-08-05 MED ORDER — VITAMIN D 25 MCG (1000 UNIT) PO TABS: 2000.0000 [IU] | ORAL_TABLET | Freq: Every day | ORAL | Status: DC

## 2023-08-05 MED ORDER — MIDODRINE HCL 5 MG PO TABS
5.0000 mg | ORAL_TABLET | Freq: Three times a day (TID) | ORAL | Status: DC
  Administered 2023-08-05 – 2023-08-09 (×11): 5 mg via ORAL
  Filled 2023-08-05 (×11): qty 1

## 2023-08-05 MED ORDER — MIDODRINE HCL 5 MG PO TABS: 10.0000 mg | ORAL_TABLET | Freq: Three times a day (TID) | ORAL | Status: DC

## 2023-08-05 MED ORDER — SODIUM CHLORIDE 0.9 % IV BOLUS: 250.0000 mL | Freq: Once | INTRAVENOUS | Status: DC

## 2023-08-05 MED ORDER — DOCUSATE SODIUM 100 MG PO CAPS: 100.0000 mg | ORAL_CAPSULE | Freq: Two times a day (BID) | ORAL | Status: DC | PRN

## 2023-08-05 MED ORDER — SODIUM CHLORIDE 0.9 % IV BOLUS
250.0000 mL | Freq: Once | INTRAVENOUS | Status: AC
  Administered 2023-08-05: 250 mL via INTRAVENOUS

## 2023-08-05 MED ORDER — ONDANSETRON HCL 4 MG PO TABS: 4.0000 mg | ORAL_TABLET | Freq: Four times a day (QID) | ORAL | Status: DC | PRN

## 2023-08-05 MED ORDER — LEVOTHYROXINE SODIUM 50 MCG PO TABS: 75.0000 ug | ORAL_TABLET | Freq: Every day | ORAL | Status: DC

## 2023-08-05 MED ORDER — ALLOPURINOL 100 MG PO TABS: 100.0000 mg | ORAL_TABLET | Freq: Every day | ORAL | Status: DC

## 2023-08-05 MED ORDER — NITROGLYCERIN 0.4 MG SL SUBL: 0.4000 mg | SUBLINGUAL_TABLET | SUBLINGUAL | Status: DC | PRN

## 2023-08-05 MED ORDER — POLYETHYLENE GLYCOL 3350 17 G PO PACK: 17.0000 g | PACK | Freq: Every day | ORAL | Status: DC | PRN

## 2023-08-05 MED ORDER — ONDANSETRON HCL 4 MG/2ML IJ SOLN: 4.0000 mg | Freq: Four times a day (QID) | INTRAMUSCULAR | Status: DC | PRN

## 2023-08-05 MED ORDER — ATORVASTATIN CALCIUM 20 MG PO TABS: 40.0000 mg | ORAL_TABLET | Freq: Every day | ORAL | Status: DC

## 2023-08-05 MED ORDER — METOPROLOL SUCCINATE ER 50 MG PO TB24
100.0000 mg | ORAL_TABLET | Freq: Every day | ORAL | Status: DC
Start: 2023-08-05 — End: 2023-08-05

## 2023-08-05 MED ORDER — TRAZODONE HCL 50 MG PO TABS: 25.0000 mg | ORAL_TABLET | Freq: Every evening | ORAL | Status: DC | PRN

## 2023-08-05 MED ORDER — INSULIN ASPART 100 UNIT/ML IJ SOLN: 0.0000 [IU] | INTRAMUSCULAR | Status: DC

## 2023-08-05 NOTE — ED Triage Notes (Signed)
 Pt to ED via [facility] from for c/o weakness and hypotension. Pt had dialysis today and has felt progressively weaker since then and had an episode of near syncope. BP 80/30 on EMS arrival, 94/42 after 500 NS.

## 2023-08-05 NOTE — H&P (Addendum)
 [name], MRN:  [id number], DOB:  03-Jun-1945, LOS: 0 ADMISSION DATE:  08/05/2023, CONSULTATION DATE: 08/05/2023 REFERRING MD: Sharyn Creamer CHIEF COMPLAINT: Near syncopal episode  HPI  78 y.o male with significant PMH of CAD s/p CABG in 1996 (LIMA-LAD, SVG-circumflex, SVG-RCA, SVG-diagonal) with redo bypass in 2007 (SVG-OM, SVG-RCA), ischemic cardiomyopathy, ESRD on HD, PVD DM 2, HTN, HLD, Gout, AAA, renal artery stenosis, COPD, GERD, Hypothyroidism, atrial fibrillation and PVCs who presented to the ED with chief complaints of dizziness with near syncopal episodes.  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 He presented to the ED on 07/22/23 with uncontrolled bleeding from upper lip laceration. Per wife, patient is on Eliquis and may have accidentally bitten his lip while asleep. The puncture wound was noted on the right upper lip that does not go past the vermilion border, it aligns with the lower incisor when he closes mouth.  Figure-of-eight suture was done with resolution of the bleeding and patient discharged to follow with his PCP. He returned to the ED on 07/23/23 with continued bleeding, blood clots were suctioned from the area and LET applied to the area. He was seen by his internal medicine doctor on the 25th, no bleeding was noted to the inner lip, the stitch was intact He returned to the ED again on 07/31/23 with continued bleeding which again resolved and he was discharged home. Patient's wife state that he went to HD session this morning at 6am and after 3.5 hours of dialysis, he complained of dizziness and had near syncopal episode. She checked his blood pressure which was in the 80's so she called EMS. On EMS arrival, patient was found to be hypotensive with sbp in the 80's he received 500 cc bolus and was transported to the ED.   ED Course: Initial vital signs showed HR of 54 beats/minute, BP 92/44 mm Hg, the RR 18  breaths/minute, and the oxygen saturation 98% on room air and a temperature of 97.17F (36.5C).  Pertinent Labs/Diagnostics Findings: Na+/ K+: 136/3.8.  Glucose: 132 BUN/Cr.:  23/3.79 Hgb/Hct: 7.2/22.6.  Plts: 117 Lactic acid: 2.5 troponin:71  CXR> mild atelectasis/infiltrate small left pleural effusion Medication administered in the ED: Patient transfused with 1 unit PRBC Disposition: ICU  Past Medical History    HFrEF (heart failure with reduced ejection fraction) (HCC) 07/19/2023   Muscle tension headache 11/15/2022   Long term (current) use of anticoagulants 11/10/2022   ESRD on dialysis (HCC) 09/16/2022   Physical deconditioning 09/16/2022   Bilateral leg edema 09/13/2022   Coronary artery disease of native artery of native heart with stable angina pectoris (HCC) 07/22/2022   OSA (obstructive sleep apnea) 07/22/2022   Chronic systolic CHF (congestive heart failure) (HCC) 07/15/2022   Typical atrial flutter (HCC) 07/15/2022   Acquired hypothyroidism 05/28/2022   GERD without esophagitis 05/28/2022   Essential hypertension 05/05/2022   Prediabetes 04/06/2022   Spinal stenosis of lumbar region without neurogenic claudication 04/06/2022   Weakness of both lower extremities 04/06/2022   Upper airway cough syndrome 10/01/2021   Bursitis of hip 12/18/2018   Paroxysmal atrial fibrillation (HCC) 06/10/2018   Atherosclerosis of native arteries of extremity with intermittent claudication (HCC) 06/25/2016   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   Coronary artery disease involving native coronary artery with angina pectoris (HCC)     Allergic rhinitis 08/19/2015   Anemia of renal disease 10/04/2014   AA (aortic aneurysm) (HCC) 10/04/2014  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   Basal cell papilloma 10/04/2014   Unspecified osteoarthritis, unspecified site 10/04/2014   Thoracic aortic aneurysm (TAA) (HCC) 10/04/2014   Atherosclerotic heart disease of native coronary artery without angina  pectoris 10/04/2014   Benign prostatic hyperplasia with lower urinary tract symptoms 10/24/2012   DOE (dyspnea on exertion) 11/12/2009   Renal artery stenosis      Mixed hyperlipidemia 09/04/2008   History of redo bypass grafting 09/04/2008    Significant Hospital Events   3/7: Admitted to stepdown with acute blood loss anemia status post 1 unit PRBC  Consults:    Procedures:  None  Significant Diagnostic Tests:  3/7: Chest Xray> IMPRESSION: 1. Evidence of prior median sternotomy/CABG. 2. Mild left basilar atelectasis and/or infiltrate. 3. Small left pleural effusion.   Interim History / Subjective:    -Hemodynamically stable  Micro Data:  None  Antimicrobials:  None  OBJECTIVE  Blood pressure (!) 89/39, pulse (!) 52, temperature 97.6 F (36.4 C), temperature source Oral, resp. rate 16, height 5\' 9"  (1.753 m), weight 92.1 kg, SpO2 100%.        Intake/Output Summary (Last 24 hours) at 08/05/2023 2216 Last data filed at 08/05/2023 1945 Gross per 24 hour  Intake 250 ml  Output --  Net 250 ml   Filed Weights   08/05/23 1820  Weight: 92.1 kg   Physical Examination  GENERAL: 78 year-old critically ill patient lying in the bed in no acute distress EYES: PEERLA. No scleral icterus. Extraocular muscles intact.  HEENT: Head atraumatic, normocephalic. Oropharynx upper lip sutures intact and nasopharynx clear.  NECK:  No JVD, supple  LUNGS: Decreased breath sounds bilaterally.  No use of accessory muscles of respiration.  CARDIOVASCULAR: S1, S2 normal. No murmurs, rubs, or gallops.  ABDOMEN: Soft, NTND EXTREMITIES: Dependent edema bilateral.  Bilateral upper lower extremity bruising.  Capillary refill < 3 seconds in all extremities. Pulses palpable distally. NEUROLOGIC: The patient is oriented x 2. No focal neurological deficit appreciated. Cranial nerves are intact.  SKIN: Multiple skin bruising and is on Eliquis Labs/imaging that I havepersonally reviewed  (right click  and "Reselect all SmartList Selections" daily)     Labs   CBC: Recent Labs  Lab 07/31/23 0111 07/31/23 1210 08/05/23 1827  WBC 5.5 7.0 5.7  NEUTROABS 3.5  --  4.1  HGB 10.4* 8.8* 7.2*  HCT 32.1* 27.1* 22.6*  MCV 100.9* 101.9* 104.6*  PLT 105* 146* 117*    Basic Metabolic Panel: Recent Labs  Lab 07/31/23 0111 07/31/23 1210 08/05/23 1827  NA 139 139 136  K 4.5 4.4 3.8  CL 102 104 100  CO2 25 19* 23  GLUCOSE 92 114* 132*  BUN 45* 46* 23  CREATININE 6.32* 6.52* 3.79*  CALCIUM 8.6* 8.1* 7.7*   GFR: Estimated Creatinine Clearance: 17.7 mL/min (A) (by C-G formula based on SCr of 3.79 mg/dL (H)). Recent Labs  Lab 07/31/23 0111 07/31/23 1210 08/05/23 1827 08/05/23 1848 08/05/23 2039  WBC 5.5 7.0 5.7  --   --   LATICACIDVEN  --   --   --  2.5* 2.7*    Liver Function Tests: Recent Labs  Lab 07/31/23 1210 08/05/23 1827  AST 40 39  ALT 27 26  ALKPHOS 121 121  BILITOT 1.4* 1.0  PROT 5.4* 5.7*  ALBUMIN 2.9* 2.9*   No results for input(s): "LIPASE", "AMYLASE" in the last 168 hours. No results for input(s): "AMMONIA" in the last 168 hours.  ABG No results found for: "PHART", "  PCO2ART", "PO2ART", "HCO3", "TCO2", "ACIDBASEDEF", "O2SAT"   Coagulation Profile: Recent Labs  Lab 07/31/23 1210 08/05/23 1848  INR 2.0* 1.5*    Cardiac Enzymes: No results for input(s): "CKTOTAL", "CKMB", "CKMBINDEX", "TROPONINI" in the last 168 hours.  HbA1C: Hemoglobin A1C  Date/Time Value Ref Range Status  09/06/2022 12:00 AM 5.8  Final  09/26/2015 12:00 AM 6.2  Final   Hgb A1c MFr Bld  Date/Time Value Ref Range Status  05/29/2022 04:04 AM 6.1 (H) 4.8 - 5.6 % Final    Comment:    (NOTE)         Prediabetes: 5.7 - 6.4         Diabetes: >6.4         Glycemic control for adults with diabetes: <7.0   05/29/2022 04:03 AM 6.0 (H) 4.8 - 5.6 % Final    Comment:    (NOTE)         Prediabetes: 5.7 - 6.4         Diabetes: >6.4         Glycemic control for adults with  diabetes: <7.0     CBG: No results for input(s): "GLUCAP" in the last 168 hours.  Review of Systems:   Unable to obtain as patient is a poor historian and very hard of hearing  Past Medical History  He,  has a past medical history of AAA (abdominal aortic aneurysm) (HCC), Acute on chronic combined systolic (congestive) and diastolic (congestive) heart failure (HCC) (05/28/2022), Arthritis, CAD (coronary artery disease) (2007), Chronic combined systolic and diastolic CHF (congestive heart failure) (HCC), Chronic lower back pain, CKD (chronic kidney disease), stage IV (HCC), COPD (chronic obstructive pulmonary disease) (HCC), Deafness in left ear, Degenerative disc disease, lumbar, Diabetes mellitus, type 2 (HCC) (10/04/2014), Dilated cardiomyopathy (HCC) (10/07/2015), Emphysema, Esophageal stricture (07/02/1998), Genital candidiasis in male (10/25/2012), GERD (gastroesophageal reflux disease), History of gout, History of hiatal hernia, Hyperlipidemia, Hypertension, Ischemic cardiomyopathy (2006), Myocardial infarction (HCC) (12/13/2014), NSTEMI (non-ST elevated myocardial infarction) (HCC) (12/13/2014), PVC's (premature ventricular contractions), Renal artery stenosis (HCC), Type II diabetes mellitus (HCC), Unstable angina (HCC) (07/27/2017), Walking pneumonia (1990's), and Wears dentures.   Surgical History    Past Surgical History:  Procedure Laterality Date   CARDIAC CATHETERIZATION  "several"   CARDIAC CATHETERIZATION N/A 12/13/2014   Procedure: Left Heart Cath and Coronary Angiography;  Surgeon: Corky Crafts, MD;  [facility] INVASIVE CV LAB;  Service: Cardiovascular;  Laterality: N/A;   CARDIAC CATHETERIZATION  12/13/2014   Procedure: Coronary Stent Intervention;  Surgeon: Corky Crafts, MD;  Location[facility] INVASIVE CV LAB;  Service: Cardiovascular;;   CARDIAC CATHETERIZATION N/A 10/07/2015   Procedure: Left Heart Cath and Cors/Grafts Angiography;  Surgeon: Kathleene Hazel, MD;  Location: [facility] INVASIVE CV LAB;  Service: Cardiovascular;  Laterality: N/A;   CARDIAC CATHETERIZATION N/A 10/07/2015   Procedure: Coronary Stent Intervention;  Surgeon: Kathleene Hazel, MD;  Location: [facility] INVASIVE CV LAB;  Service: Cardiovascular;  Laterality: N/A;   CARDIOVERSION N/A 05/25/2022   Procedure: CARDIOVERSION;  Surgeon: Thomasene Ripple, DO;  Location: [facility] ENDOSCOPY;  Service: Cardiovascular;  Laterality: N/A;   CATARACT EXTRACTION W/PHACO Left 10/21/2021   Procedure: CATARACT EXTRACTION PHACO AND INTRAOCULAR LENS PLACEMENT (IOC) LEFT 3.94 00:33.7;  Surgeon: Nevada Crane, MD;  Location: [facility];  Service: Ophthalmology;  Laterality: Left;   CATARACT EXTRACTION W/PHACO Right 11/09/2021   Procedure: CATARACT EXTRACTION PHACO AND INTRAOCULAR LENS PLACEMENT (IOC) RIGHT;  Surgeon: Nevada Crane, MD;  Location: [facility];  Service: Ophthalmology;  Laterality: Right;  3.59 0:29.4   CORONARY ANGIOPLASTY  "several"   CORONARY ANGIOPLASTY WITH STENT PLACEMENT  2005; 12/13/2014   "2; 1"   CORONARY ARTERY BYPASS GRAFT  05/31/1994   CABG X5   CORONARY ARTERY BYPASS GRAFT  07/29/2005   CABG X3   DIALYSIS/PERMA CATHETER INSERTION N/A 09/10/2022   Procedure: DIALYSIS/PERMA CATHETER INSERTION;  Surgeon: Renford Dills, MD;  Location: [facility] INVASIVE CV LAB;  Service: Cardiovascular;  Laterality: N/A;   ESOPHAGOGASTRODUODENOSCOPY (EGD) WITH ESOPHAGEAL DILATION  05/31/1998   GREEN LIGHT LASER TURP (TRANSURETHRAL RESECTION OF PROSTATE  01/30/1999   "not cancerous"   HERNIA REPAIR     LAPAROSCOPIC CHOLECYSTECTOMY     LEFT HEART CATH AND CORS/GRAFTS ANGIOGRAPHY N/A 07/28/2017   Procedure: LEFT HEART CATH AND CORS/GRAFTS ANGIOGRAPHY;  Surgeon: Lennette Bihari, MD;  Location: [facility] INVASIVE CV LAB;  Service: Cardiovascular;  Laterality: N/A;   LUNG SURGERY  05/31/1994   "S/P CABG, had to put staple in lung after it had collapsed"   MINOR REMOVAL OF  PERITONEAL DIALYSIS CATHETER  10/2022   UMBILICAL HERNIA REPAIR     w/chole     Social History   reports that he quit smoking about 37 years ago. His smoking use included cigarettes. He started smoking about 57 years ago. He has a 60 pack-year smoking history. He has never used smokeless tobacco. He reports that he does not drink alcohol and does not use drugs.   Family History   His family history includes Asthma in his mother; Heart attack in his mother; Heart disease in his father and mother; Hyperlipidemia in his mother; Hypertension in his mother; Rheumatic fever in his father; Stroke in his mother. There is no history of Colon cancer.   Allergies Allergies  Allergen Reactions   Predicort [Prednisolone] Other (See Comments)    Stomach pain   Ciprofloxacin Other (See Comments)    GI upset   Hydrochlorothiazide Other (See Comments)    Dehydration   Hydrocodone Nausea Only and Other (See Comments)    Stomach upset    Hydrocodone-Acetaminophen Nausea Only    Stomach upset   Sulfa Antibiotics Other (See Comments)    Cannot recall   Penicillins Hives, Rash and Other (See Comments)    Has patient had a PCN reaction causing immediate rash, facial/tongue/throat swelling, SOB or lightheadedness with hypotension: YES  Has patient had a PCN reaction causing severe rash involving mucus membranes or skin necrosis: NO  Has patient had a PCN reaction that required hospitalization NO  Has patient had a PCN reaction occurring within the last 10 years:NO  If all of the above answers are "NO", then may proceed with Cephalosporin use.  Has patient had a PCN reaction causing immediate rash, facial/tongue/throat swelling, SOB or lightheadedness with hypotension: YES Has patient had a PCN reaction causing severe rash involving mucus membranes or skin necrosis: NO Has patient had a PCN reaction that required hospitalization NO Has patient had a PCN reaction occurring within the last 10 years:NO If  all of the above answers are "NO", then may proceed with Cephalosporin use.    Home Medications  Prior to Admission medications   Medication Sig Start Date End Date Taking? Authorizing Provider  acetaminophen (TYLENOL) 650 MG CR tablet Take 1,300 mg by mouth at bedtime.    [provider]  allopurinol (ZYLOPRIM) 100 MG tablet TAKE 1 TABLET BY MOUTH EVERYDAY AT BEDTIME 07/08/23   Reubin Milan,  MD  amiodarone (PACERONE) 200 MG tablet TAKE 1 TABLET BY MOUTH EVERY DAY 04/22/23   Kathleene Hazel, MD  atorvastatin (LIPITOR) 40 MG tablet TAKE 1 TABLET BY MOUTH EVERYDAY AT BEDTIME 07/15/23   Reubin Milan, MD  Cholecalciferol (VITAMIN D-3) 125 MCG (5000 UT) TABS Take 2,000 Units by mouth daily.     [provider]  diphenhydrAMINE (BENADRYL) 50 MG capsule Take 50 mg by mouth at bedtime.    [provider]  ELIQUIS 5 MG TABS tablet TAKE 1 TABLET BY MOUTH TWICE A DAY 05/23/23   Kathleene Hazel, MD  furosemide (LASIX) 40 MG tablet Take 40 mg by mouth daily as needed for edema. Taking 1 1/2 tablets (60mg  total) Pt takes on Tue. Thur. Sat.and Sunday's. 10/12/22   [provider]  levothyroxine (SYNTHROID) 75 MCG tablet TAKE 1 TABLET BY MOUTH EVERY DAY BEFORE BREAKFAST 07/19/23   Reubin Milan, MD  metoprolol succinate (TOPROL XL) 100 MG 24 hr tablet Take 1 tablet (100 mg total) by mouth daily. Take with or immediately following a meal. 05/03/23 05/02/24  Romie Minus, MD  Multiple Vitamin (MULTIVITAMIN PO) Take 1 tablet by mouth daily.    [provider]  nitroGLYCERIN (NITROSTAT) 0.4 MG SL tablet Place 0.4 mg under the tongue every 5 (five) minutes as needed for chest pain (Up to 3 times).    [provider]  pantoprazole (PROTONIX) 40 MG tablet Take 1 tablet (40 mg total) by mouth 2 (two) times daily. Home med. 07/23/23   Reubin Milan, MD  senna (SENOKOT) 8.6 MG tablet Take 1 tablet by mouth as needed for constipation.     [provider]  Scheduled Meds:  cholecalciferol  2,000 Units Oral Daily   insulin aspart  0-6 Units Subcutaneous Q4H   levothyroxine  75 mcg Oral Q0600   midodrine  5 mg Oral TID WC   pantoprazole  40 mg Oral BID   Continuous Infusions: PRN Meds:.docusate sodium, fentaNYL (SUBLIMAZE) injection, polyethylene glycol   Active Hospital Problem list   See systems below  Assessment & Plan:    #Acute Blood Loss Anemia in the setting of uncontrolled lip laceration bleeding on Eliquis #Thrombocytopenia  #Anemia of CKD -Monitor for S/Sx of bleeding -Trend CBC (H&H q6h) -Currently hemodynamically stable not requiring pressors -SCD's for VTE Prophylaxis (chemical ppx contraindicated) -Transfuse for Hgb <8 due to sig cardiac hx  (has received 1 units pRBC's)  #Mildly Elevated Troponin, suspect demand ischemia #HFrEF without acute decompensation #Atrial Fibrillation on Eliquis PMHx: CAD s/p CABG in 1996 (LIMA-LAD, SVG-circumflex, SVG-RCA, SVG-diagonal) with redo bypass in 2007 (SVG-OM, SVG-RCA), ischemic cardiomyopathy HLD, HTN -Trend HS Troponin until peaked -Hold Eliquis -Hold amiodarone and metoprolol -Hold Lasix -Continue atorvastatin 40 mg daily  #ESRD on HD MWF Last HD 08/05/23 -Follow BMP -Ensure adequate renal perfusion -Replace electrolytes as indicated -Nephro consult    #T2DM -HgbA1c goal pending -CBG's q4; Target range of 140 to 180 -SSI -Follow ICU Hypo/Hyperglycemia protocol  #COPD without evidence of acute exacerbation #OSA -Supplemental O2 as needed to maintain O2 saturations 88 to 92%  #Hypothyroidism -Check TSH and free T4 -Continue home Synthroid  #Gout -On allopurinol  Best practice:  Diet:  NPO Pain/Anxiety/Delirium protocol (if indicated): No VAP protocol (if indicated): Not indicated DVT prophylaxis: Contraindicated GI prophylaxis: PPI Glucose control:  SSI Yes Central venous access:  N/A Arterial line:  N/A Foley:  N/A Mobility:   bed rest  PT consulted: N/A Last  date of multidisciplinary goals of care discussion [3/7] Code Status:  full code Disposition: stepdown   = Goals of Care = Code Status Order: FULL  Primary Emergency Contact: Randie, [place], Home Phone: [phone] Wishes to pursue full aggressive treatment and intervention options, including CPR and intubation, but  PER WIFE goals of care will be addressed on going if that should become necessary.   Critical care time: 45 minutes        Webb Silversmith DNP, CCRN, FNP-C, AGACNP-BC Acute Care & Family Nurse Practitioner [facility] PCCM on call pager [phone]

## 2023-08-05 NOTE — ED Provider Notes (Addendum)
 [facility] Provider Note    Event Date/Time   First MD Initiated Contact with Patient 08/05/23 1815     (approximate)   History     HPI  Ronald [name] is a 78 y.o. male reports been feeling lightheaded for the last day or 2.  In addition he has had a couple episodes none over the last 2 days but where he would have lip bleeding he reports he had quite a bit of bleeding from his lip but that seems to have stopped the last 2 days but since then has been feeling lightheaded especially when he tries to get up and up or walk.  He reports he almost passed out when he tried to stand up this evening.  He did not pass out though or fall  No fevers no cough no pain anywhere no chest pain no trouble breathing.  Had dialysis today.  EMS reports patient hypotensive blood pressure in the low 80s given 500 mL normal saline with EMS     Physical Exam   Triage Vital Signs: ED Triage Vitals  Encounter Vitals Group     BP      Systolic BP Percentile      Diastolic BP Percentile      Pulse      Resp      Temp      Temp src      SpO2      Weight      Height      Head Circumference      Peak Flow      Pain Score      Pain Loc      Pain Education      Exclude from Growth Chart     Most recent vital signs: Vitals:   08/05/23 2030 08/05/23 2040  BP: (!) 90/40 (!) 89/39  Pulse: (!) 50 (!) 52  Resp: (!) 24 16  Temp:  97.6 F (36.4 C)  SpO2: 100% 100%     General: Awake, no distress.  CV:  Good peripheral perfusion.  Normal tones and slight irregularity of rate Resp:  Normal effort.  Clear bilateral with normal work of breathing.  Hemodialysis catheter subclavian right chest clean dry intact no evidence of bleeding around Abd:  No distention.  Soft nontender nondistended Other:  Normal facial expressions moves extremities well no noted deficits grossly.  Fully awake and alert.  No cyanosis the nailbed no pallor  Slow healing clean dry intact scar over  his right upper lip with small intraoral suture.  No active bleeding patient reports it has not bled for about 2 days  ED Results / Procedures / Treatments   Labs (all labs ordered are listed, but only abnormal results are displayed) Labs Reviewed  LACTIC ACID, PLASMA - Abnormal; Notable for the following components:      Result Value   Lactic Acid, Venous 2.5 (*)    All other components within normal limits  COMPREHENSIVE METABOLIC PANEL - Abnormal; Notable for the following components:   Glucose, Bld 132 (*)    Creatinine, Ser 3.79 (*)    Calcium 7.7 (*)    Total Protein 5.7 (*)    Albumin 2.9 (*)    GFR, Estimated 15 (*)    All other components within normal limits  CBC WITH DIFFERENTIAL/PLATELET - Abnormal; Notable for the following components:   RBC 2.16 (*)    Hemoglobin 7.2 (*)    HCT 22.6 (*)  MCV 104.6 (*)    RDW 17.0 (*)    Platelets 117 (*)    All other components within normal limits  PROTIME-INR - Abnormal; Notable for the following components:   Prothrombin Time 18.7 (*)    INR 1.5 (*)    All other components within normal limits  TROPONIN I (HIGH SENSITIVITY) - Abnormal; Notable for the following components:   Troponin I (High Sensitivity) 71 (*)    All other components within normal limits  APTT  LACTIC ACID, PLASMA  URINALYSIS, W/ REFLEX TO CULTURE (INFECTION SUSPECTED)  TYPE AND SCREEN  PREPARE RBC (CROSSMATCH)  TROPONIN I (HIGH SENSITIVITY)     EKG  And interpreted by me at 1835 heart rate 55 QRS 140 QTc 430 Suspect underlying A-fib, mild ST segment depression noted in anterolateral leads.  Of note the patient denies any chest pain or dyspnea.  Patient's troponin is somewhat elevated, in the setting of worsening anemia without associated chest pain I suspect there may be of small amount of demand ischemia present.  He is also noted to be a hemodialysis patient doubt ACS at this juncture   RADIOLOGY  Note final read of chest x-ray by  radiologist is pending.  Chest x-ray appears unchanged from previous, suspect chronic scarring in the left chest.  Previous sternotomy  PROCEDURES:  Critical Care performed: Yes, see critical care procedure note(s)  CRITICAL CARE Performed by: Sharyn Creamer   Total critical care time: 35 minutes  Critical care time was exclusive of separately billable procedures and treating other patients.  Critical care was necessary to treat or prevent imminent or life-threatening deterioration.  Critical care was time spent personally by me on the following activities: development of treatment plan with patient and/or surrogate as well as nursing, discussions with consultants, evaluation of patient's response to treatment, examination of patient, obtaining history from patient or surrogate, ordering and performing treatments and interventions, ordering and review of laboratory studies, ordering and review of radiographic studies, pulse oximetry and re-evaluation of patient's condition.  Procedures   MEDICATIONS ORDERED IN ED: Medications  acetaminophen (TYLENOL) tablet 650 mg (has no administration in time range)  sodium chloride 0.9 % bolus 250 mL (0 mLs Intravenous Stopped 08/05/23 1945)  0.9 %  sodium chloride infusion (10 mL/hr Intravenous New Bag/Given 08/05/23 2028)     IMPRESSION / MDM / ASSESSMENT AND PLAN / ED COURSE  I reviewed the triage vital signs and the nursing notes.                              Differential diagnosis includes, but is not limited to, acute anemia, dehydration, infection, cardiac, orthostatic etc.  Patient provides a history of fairly extensive bleeding while anticoagulated has restarted his anticoagulant today.  No active bleeding now from the lip but he does have new acute on chronic anemia and I suspect that this is likely secondary to the amount of reported heavy bleeding from his lip that he has had intermittently over the last few days.  Discussed risks  benefits alternatives with patient and wife both agreeable with proceeding with blood transfusion.  Patient's presentation is most consistent with acute presentation with potential threat to life or bodily function.  Patient has moderate hypotension but blood pressure improving with IV fluids.  At 7:40 PM systolic blood pressure 99 he is currently asymptomatic fully alert and oriented.  Given his history of end-stage renal disease will withhold further  fluid at this time in favor of initiating 1 unit of blood transfusion.  Consulted with patient will be admitted to the hospital service under the care of Dr. Arville Care  Patient is wife both understand agreeable plan for admission    Clinical Course as of 08/05/23 2049  Fri Aug 05, 2023  2047 Patient approximately 15 minutes into blood transfusion, blood pressure remains marginal at 89/39 with low MAP at this time.  Patient awake alert fully oriented without distress very reassuring exam but given the persistence of hypotension will admit ICU team [MQ]    Clinical Course User Index [MQ] Sharyn Creamer, MD   Webb Silversmith discussed and accepted case to ICU  FINAL CLINICAL IMPRESSION(S) / ED DIAGNOSES   Final diagnoses:  Anemia, unspecified type  ESRD (end stage renal disease) on dialysis (HCC)  Hypotension, unspecified hypotension type     Rx / DC Orders   ED Discharge Orders     None        Note:  This document was prepared using Dragon voice recognition software and may include unintentional dictation errors.   Sharyn Creamer, MD 08/05/23 Alvina Filbert    Sharyn Creamer, MD 08/05/23 2050

## 2023-08-06 DIAGNOSIS — D696 Thrombocytopenia, unspecified: Secondary | ICD-10-CM | POA: Diagnosis not present

## 2023-08-06 DIAGNOSIS — Z992 Dependence on renal dialysis: Secondary | ICD-10-CM

## 2023-08-06 DIAGNOSIS — R7989 Other specified abnormal findings of blood chemistry: Secondary | ICD-10-CM | POA: Diagnosis not present

## 2023-08-06 DIAGNOSIS — J449 Chronic obstructive pulmonary disease, unspecified: Secondary | ICD-10-CM

## 2023-08-06 DIAGNOSIS — S01511A Laceration without foreign body of lip, initial encounter: Secondary | ICD-10-CM

## 2023-08-06 DIAGNOSIS — I5022 Chronic systolic (congestive) heart failure: Secondary | ICD-10-CM

## 2023-08-06 DIAGNOSIS — E039 Hypothyroidism, unspecified: Secondary | ICD-10-CM

## 2023-08-06 DIAGNOSIS — G4733 Obstructive sleep apnea (adult) (pediatric): Secondary | ICD-10-CM

## 2023-08-06 DIAGNOSIS — D62 Acute posthemorrhagic anemia: Principal | ICD-10-CM

## 2023-08-06 DIAGNOSIS — I4891 Unspecified atrial fibrillation: Secondary | ICD-10-CM

## 2023-08-06 DIAGNOSIS — E119 Type 2 diabetes mellitus without complications: Secondary | ICD-10-CM

## 2023-08-06 DIAGNOSIS — N186 End stage renal disease: Secondary | ICD-10-CM

## 2023-08-06 LAB — TYPE AND SCREEN
ABO/RH(D): O POS
Antibody Screen: NEGATIVE
Unit division: 0

## 2023-08-06 LAB — BASIC METABOLIC PANEL
Anion gap: 9 (ref 5–15)
BUN: 29 mg/dL — ABNORMAL HIGH (ref 8–23)
CO2: 23 mmol/L (ref 22–32)
Calcium: 7.9 mg/dL — ABNORMAL LOW (ref 8.9–10.3)
Chloride: 103 mmol/L (ref 98–111)
Creatinine, Ser: 4.84 mg/dL — ABNORMAL HIGH (ref 0.61–1.24)
GFR, Estimated: 12 mL/min — ABNORMAL LOW (ref >60)
Glucose, Bld: 105 mg/dL — ABNORMAL HIGH (ref 70–99)
Potassium: 4.1 mmol/L (ref 3.5–5.1)
Sodium: 135 mmol/L (ref 135–145)

## 2023-08-06 LAB — URINALYSIS, W/ REFLEX TO CULTURE (INFECTION SUSPECTED)
Glucose, UA: NEGATIVE mg/dL
Ketones, ur: NEGATIVE mg/dL
Nitrite: NEGATIVE
Protein, ur: >=300 mg/dL — AB
Specific Gravity, Urine: 1.022 (ref 1.005–1.030)
pH: 5 (ref 5.0–8.0)

## 2023-08-06 LAB — CBG MONITORING, ED
Glucose-Capillary: 117 mg/dL — ABNORMAL HIGH (ref 70–99)
Glucose-Capillary: 100 mg/dL — ABNORMAL HIGH (ref 70–99)
Glucose-Capillary: 74 mg/dL (ref 70–99)
Glucose-Capillary: 84 mg/dL (ref 70–99)
Glucose-Capillary: 120 mg/dL — ABNORMAL HIGH (ref 70–99)
Glucose-Capillary: 127 mg/dL — ABNORMAL HIGH (ref 70–99)

## 2023-08-06 LAB — HEMOGLOBIN AND HEMATOCRIT, BLOOD
HCT: 24.9 % — ABNORMAL LOW (ref 39.0–52.0)
HCT: 24.9 % — ABNORMAL LOW (ref 39.0–52.0)
HCT: 25.8 % — ABNORMAL LOW (ref 39.0–52.0)
HCT: 26.8 % — ABNORMAL LOW (ref 39.0–52.0)
Hemoglobin: 8.1 g/dL — ABNORMAL LOW (ref 13.0–17.0)
Hemoglobin: 8.1 g/dL — ABNORMAL LOW (ref 13.0–17.0)
Hemoglobin: 8.3 g/dL — ABNORMAL LOW (ref 13.0–17.0)
Hemoglobin: 8.7 g/dL — ABNORMAL LOW (ref 13.0–17.0)

## 2023-08-06 LAB — CBC
HCT: 25.8 % — ABNORMAL LOW (ref 39.0–52.0)
Hemoglobin: 8.3 g/dL — ABNORMAL LOW (ref 13.0–17.0)
MCH: 32.3 pg (ref 26.0–34.0)
MCHC: 32.2 g/dL (ref 30.0–36.0)
MCV: 100.4 fL — ABNORMAL HIGH (ref 80.0–100.0)
Platelets: 107 10*3/uL — ABNORMAL LOW (ref 150–400)
RBC: 2.57 MIL/uL — ABNORMAL LOW (ref 4.22–5.81)
RDW: 19.1 % — ABNORMAL HIGH (ref 11.5–15.5)
WBC: 6.8 10*3/uL (ref 4.0–10.5)
nRBC: 0 % (ref 0.0–0.2)

## 2023-08-06 LAB — PHOSPHORUS: Phosphorus: 5.2 mg/dL — ABNORMAL HIGH (ref 2.5–4.6)

## 2023-08-06 LAB — T4, FREE: Free T4: 0.9 ng/dL (ref 0.61–1.12)

## 2023-08-06 LAB — BPAM RBC
Blood Product Expiration Date: 202503282359
ISSUE DATE / TIME: 202503072015
Unit Type and Rh: 5100

## 2023-08-06 LAB — HEMOGLOBIN A1C
Hgb A1c MFr Bld: 4.7 % — ABNORMAL LOW (ref 4.8–5.6)
Mean Plasma Glucose: 88.19 mg/dL

## 2023-08-06 LAB — MAGNESIUM: Magnesium: 2 mg/dL (ref 1.7–2.4)

## 2023-08-06 LAB — LACTIC ACID, PLASMA: Lactic Acid, Venous: 2.1 mmol/L (ref 0.5–1.9)

## 2023-08-06 LAB — TSH: TSH: 6.584 u[IU]/mL — ABNORMAL HIGH (ref 0.350–4.500)

## 2023-08-06 MED ORDER — PANTOPRAZOLE SODIUM 40 MG PO TBEC
40.0000 mg | DELAYED_RELEASE_TABLET | Freq: Two times a day (BID) | ORAL | Status: DC
  Administered 2023-08-06 – 2023-08-09 (×8): 40 mg via ORAL
  Filled 2023-08-06 (×8): qty 1

## 2023-08-06 MED ORDER — LEVOTHYROXINE SODIUM 50 MCG PO TABS
75.0000 ug | ORAL_TABLET | Freq: Every day | ORAL | Status: DC
  Administered 2023-08-06 – 2023-08-09 (×4): 75 ug via ORAL
  Filled 2023-08-06 (×2): qty 2
  Filled 2023-08-06 (×2): qty 1

## 2023-08-06 MED ORDER — VITAMIN D 25 MCG (1000 UNIT) PO TABS
2000.0000 [IU] | ORAL_TABLET | Freq: Every day | ORAL | Status: DC
  Administered 2023-08-06 – 2023-08-07 (×2): 2000 [IU] via ORAL
  Filled 2023-08-06 (×2): qty 2

## 2023-08-06 NOTE — ED Notes (Signed)
 Pt was able to talk to wife on phone at this time.

## 2023-08-06 NOTE — Progress Notes (Signed)
 NAMEKORY [name], MRN:  [id number], DOB:  1945-11-30, LOS: 1 ADMISSION DATE:  08/05/2023, CHIEF COMPLAINT: Dizziness   History of Present Illness:   77 y.o male with significant PMH of CAD s/p CABG in 1996 (LIMA-LAD, SVG-circumflex, SVG-RCA, SVG-diagonal) with redo bypass in 2007 (SVG-OM, SVG-RCA), ischemic cardiomyopathy, ESRD on HD, PVD DM 2, HTN, HLD, Gout, AAA, renal artery stenosis, COPD, GERD, Hypothyroidism, atrial fibrillation and PVCs who presented to the ED with chief complaints of dizziness with near syncopal episodes.   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 He presented to the ED on 07/22/23 with uncontrolled bleeding from upper lip laceration. Per wife, patient is on Eliquis and may have accidentally bitten his lip while asleep. The puncture wound was noted on the right upper lip that does not go past the vermilion border, it aligns with the lower incisor when he closes mouth.  Figure-of-eight suture was done with resolution of the bleeding and patient discharged to follow with his PCP. He returned to the ED on 07/23/23 with continued bleeding, blood clots were suctioned from the area and LET applied to the area. He was seen by his internal medicine doctor on the 25th, no bleeding was noted to the inner lip, the stitch was intact He returned to the ED again on 07/31/23 with continued bleeding which again resolved and he was discharged home. Patient's wife state that he went to HD session this morning at 6am and after 3.5 hours of dialysis, he complained of dizziness and had near syncopal episode. She checked his blood pressure which was in the 80's so she called EMS. On EMS arrival, patient was found to be hypotensive with sbp in the 80's he received 500 cc bolus and was transported to the ED.   ED Course: Initial vital signs showed HR of 54 beats/minute, BP 92/44 mm Hg, the RR 18 breaths/minute, and the oxygen saturation 98%  on room air and a temperature of 97.13F (36.5C).  Pertinent Labs/Diagnostics Findings: Na+/ K+: 136/3.8.  Glucose: 132 BUN/Cr.:  23/3.79 Hgb/Hct: 7.2/22.6.  Plts: 117 Lactic acid: 2.5 troponin:71  CXR> mild atelectasis/infiltrate small left pleural effusion Medication administered in the ED: Patient transfused with 1 unit PRBC   Pertinent  Medical History  -HFrEF -ESRD -OSA -Hypothyroidism -DMII -Afib  Significant Hospital Events: Including procedures, antibiotic start and stop dates in addition to other pertinent events   3/7 - admission due to hypotension, 1 unit PRBC 3/8 - hemodynamically stable  Interim History / Subjective:  Patient feels he's admitted due to lip bleed. Denies shortness of breath or chest pain.  Objective   Blood pressure (!) 117/45, pulse (!) 53, temperature 98.4 F (36.9 C), temperature source Oral, resp. rate 15, height 5\' 9"  (1.753 m), weight 92.1 kg, SpO2 90%.        Intake/Output Summary (Last 24 hours) at 08/06/2023 1540 Last data filed at 08/05/2023 1945 Gross per 24 hour  Intake 250 ml  Output --  Net 250 ml   Filed Weights   08/05/23 1820  Weight: 92.1 kg    Examination: Physical Exam Constitutional:      Appearance: He is not ill-appearing.  Cardiovascular:     Rate and Rhythm: Normal rate and regular rhythm.     Pulses: Normal pulses.     Heart sounds: Normal heart sounds.  Pulmonary:     Breath sounds: Normal breath sounds.  Musculoskeletal:  Right lower leg: Edema present.     Left lower leg: Edema present.  Neurological:     General: No focal deficit present.     Mental Status: He is alert. Mental status is at baseline.     Assessment & Plan:   #Circulatory Shock #Acute Blood Loss Anemia #HFrEF #Afib on Eliquis #CAD s/p CABG (twice) #ESRD on HD #T2DM #COPD #OSA #Hypothyroidism  Neuro - tylenol for pain as needed CV - patient chronically hypotensive but degree is more than baseline per wife. BP has  improved with discontinuation of anti-HTN agents (amio, metoprolol, furosemide) as well as following a unit of blood. Resumed midodrine. BP is improved. Pulm - continue nocturnal CPAP. On room air. GI - resume diet as tolerated. No active issues at this point Renal - ESRD on HD three times a week. Will need renal consult if remains hospitalized. Endo - ICU glycemic protocol with sliding scale. Resume home levothyroxine. Hem/Onc - did have acute blood loss anemia with eliquis use. This is being held for now. Check CBC q8hours. Goal H/H 8 given underlying cardiac disease. ID - no sign of infection, holding on antibiotics at this moment. Urinalysis and blood cultures ordered  Best Practice (right click and "Reselect all SmartList Selections" daily)   Diet/type: Regular consistency (see orders) DVT prophylaxis SCD Pressure ulcer(s): N/A GI prophylaxis: N/A Lines: N/A Foley:  N/A Code Status:  full code Last date of multidisciplinary goals of care discussion [08/06/2023]  Labs   CBC: Recent Labs  Lab 07/31/23 0111 07/31/23 1210 08/05/23 1827 08/06/23 0004 08/06/23 0627 08/06/23 1202  WBC 5.5 7.0 5.7  --  6.8  --   NEUTROABS 3.5  --  4.1  --   --   --   HGB 10.4* 8.8* 7.2* 8.1* 8.3* 8.1*  HCT 32.1* 27.1* 22.6* 24.9* 25.8* 24.9*  MCV 100.9* 101.9* 104.6*  --  100.4*  --   PLT 105* 146* 117*  --  107*  --     Basic Metabolic Panel: Recent Labs  Lab 07/31/23 0111 07/31/23 1210 08/05/23 1827 08/06/23 0627  NA 139 139 136 135  K 4.5 4.4 3.8 4.1  CL 102 104 100 103  CO2 25 19* 23 23  GLUCOSE 92 114* 132* 105*  BUN 45* 46* 23 29*  CREATININE 6.32* 6.52* 3.79* 4.84*  CALCIUM 8.6* 8.1* 7.7* 7.9*  MG  --   --   --  2.0  PHOS  --   --   --  5.2*   GFR: Estimated Creatinine Clearance: 13.9 mL/min (A) (by C-G formula based on SCr of 4.84 mg/dL (H)). Recent Labs  Lab 07/31/23 0111 07/31/23 1210 08/05/23 1827 08/05/23 1848 08/05/23 2039 08/06/23 0627  WBC 5.5 7.0 5.7  --    --  6.8  LATICACIDVEN  --   --   --  2.5* 2.7*  --     Liver Function Tests: Recent Labs  Lab 07/31/23 1210 08/05/23 1827  AST 40 39  ALT 27 26  ALKPHOS 121 121  BILITOT 1.4* 1.0  PROT 5.4* 5.7*  ALBUMIN 2.9* 2.9*   No results for input(s): "LIPASE", "AMYLASE" in the last 168 hours. No results for input(s): "AMMONIA" in the last 168 hours.  ABG No results found for: "PHART", "PCO2ART", "PO2ART", "HCO3", "TCO2", "ACIDBASEDEF", "O2SAT"   Coagulation Profile: Recent Labs  Lab 07/31/23 1210 08/05/23 1848  INR 2.0* 1.5*    Cardiac Enzymes: No results for input(s): "CKTOTAL", "CKMB", "CKMBINDEX", "TROPONINI" in the  last 168 hours.  HbA1C: Hemoglobin A1C  Date/Time Value Ref Range Status  09/06/2022 12:00 AM 5.8  Final  09/26/2015 12:00 AM 6.2  Final   Hgb A1c MFr Bld  Date/Time Value Ref Range Status  08/06/2023 12:04 AM 4.7 (L) 4.8 - 5.6 % Final    Comment:    (NOTE) Pre diabetes:          5.7%-6.4%  Diabetes:              >6.4%  Glycemic control for   <7.0% adults with diabetes   05/29/2022 04:04 AM 6.1 (H) 4.8 - 5.6 % Final    Comment:    (NOTE)         Prediabetes: 5.7 - 6.4         Diabetes: >6.4         Glycemic control for adults with diabetes: <7.0     CBG: Recent Labs  Lab 08/05/23 2250 08/06/23 0233 08/06/23 0730 08/06/23 1021 08/06/23 1459  GLUCAP 127* 117* 100* 74 84    Review of Systems:   N/A  Past Medical History:  He,  has a past medical history of AAA (abdominal aortic aneurysm) (HCC), Acute on chronic combined systolic (congestive) and diastolic (congestive) heart failure (HCC) (05/28/2022), Arthritis, CAD (coronary artery disease) (2007), Chronic combined systolic and diastolic CHF (congestive heart failure) (HCC), Chronic lower back pain, CKD (chronic kidney disease), stage IV (HCC), COPD (chronic obstructive pulmonary disease) (HCC), Deafness in left ear, Degenerative disc disease, lumbar, Diabetes mellitus, type 2 (HCC)  (10/04/2014), Dilated cardiomyopathy (HCC) (10/07/2015), Emphysema, Esophageal stricture (07/02/1998), Genital candidiasis in male (10/25/2012), GERD (gastroesophageal reflux disease), History of gout, History of hiatal hernia, Hyperlipidemia, Hypertension, Ischemic cardiomyopathy (2006), Myocardial infarction (HCC) (12/13/2014), NSTEMI (non-ST elevated myocardial infarction) (HCC) (12/13/2014), PVC's (premature ventricular contractions), Renal artery stenosis (HCC), Type II diabetes mellitus (HCC), Unstable angina (HCC) (07/27/2017), Walking pneumonia (1990's), and Wears dentures.   Surgical History:   Past Surgical History:  Procedure Laterality Date   CARDIAC CATHETERIZATION  "several"   CARDIAC CATHETERIZATION N/A 12/13/2014   Procedure: Left Heart Cath and Coronary Angiography;  Surgeon: Corky Crafts, MD;  Location: [facility] INVASIVE CV LAB;  Service: Cardiovascular;  Laterality: N/A;   CARDIAC CATHETERIZATION  12/13/2014   Procedure: Coronary Stent Intervention;  Surgeon: Corky Crafts, MD;  Location: [facility] INVASIVE CV LAB;  Service: Cardiovascular;;   CARDIAC CATHETERIZATION N/A 10/07/2015   Procedure: Left Heart Cath and Cors/Grafts Angiography;  Surgeon: Kathleene Hazel, MD;  Location: [facility] INVASIVE CV LAB;  Service: Cardiovascular;  Laterality: N/A;   CARDIAC CATHETERIZATION N/A 10/07/2015   Procedure: Coronary Stent Intervention;  Surgeon: Kathleene Hazel, MD;  Location: [facility] INVASIVE CV LAB;  Service: Cardiovascular;  Laterality: N/A;   CARDIOVERSION N/A 05/25/2022   Procedure: CARDIOVERSION;  Surgeon: Thomasene Ripple, DO;  Location: [facility] ENDOSCOPY;  Service: Cardiovascular;  Laterality: N/A;   CATARACT EXTRACTION W/PHACO Left 10/21/2021   Procedure: CATARACT EXTRACTION PHACO AND INTRAOCULAR LENS PLACEMENT (IOC) LEFT 3.94 00:33.7;  Surgeon: Nevada Crane, MD;  Location: [facility]ERY [facility];  Service: Ophthalmology;  Laterality: Left;   CATARACT EXTRACTION W/PHACO Right  11/09/2021   Procedure: CATARACT EXTRACTION PHACO AND INTRAOCULAR LENS PLACEMENT (IOC) RIGHT;  Surgeon: Nevada Crane, MD;  Location: [facility] SURGERY [facility];  Service: Ophthalmology;  Laterality: Right;  3.59 0:29.4   CORONARY ANGIOPLASTY  "several"   CORONARY ANGIOPLASTY WITH STENT PLACEMENT  2005; 12/13/2014   "2; 1"   CORONARY ARTERY BYPASS GRAFT  05/31/1994   CABG X5   CORONARY ARTERY BYPASS GRAFT  07/29/2005   CABG X3   DIALYSIS/PERMA CATHETER INSERTION N/A 09/10/2022   Procedure: DIALYSIS/PERMA CATHETER INSERTION;  Surgeon: Renford Dills, MD;  Location: [facility] INVASIVE CV LAB;  Service: Cardiovascular;  Laterality: N/A;   ESOPHAGOGASTRODUODENOSCOPY (EGD) WITH ESOPHAGEAL DILATION  05/31/1998   GREEN LIGHT LASER TURP (TRANSURETHRAL RESECTION OF PROSTATE  01/30/1999   "not cancerous"   HERNIA REPAIR     LAPAROSCOPIC CHOLECYSTECTOMY     LEFT HEART CATH AND CORS/GRAFTS ANGIOGRAPHY N/A 07/28/2017   Procedure: LEFT HEART CATH AND CORS/GRAFTS ANGIOGRAPHY;  Surgeon: Lennette Bihari, MD;  Location: [facility] INVASIVE CV LAB;  Service: Cardiovascular;  Laterality: N/A;   LUNG SURGERY  05/31/1994   "S/P CABG, had to put staple in lung after it had collapsed"   MINOR REMOVAL OF PERITONEAL DIALYSIS CATHETER  10/2022   UMBILICAL HERNIA REPAIR     w/chole     Social History:   reports that he quit smoking about 37 years ago. His smoking use included cigarettes. He started smoking about 57 years ago. He has a 60 pack-year smoking history. He has never used smokeless tobacco. He reports that he does not drink alcohol and does not use drugs.   Family History:  His family history includes Asthma in his mother; Heart attack in his mother; Heart disease in his father and mother; Hyperlipidemia in his mother; Hypertension in his mother; Rheumatic fever in his father; Stroke in his mother. There is no history of Colon cancer.   Allergies Allergies  Allergen Reactions   Predicort [Prednisolone] Other  (See Comments)    Stomach pain   Ciprofloxacin Other (See Comments)    GI upset   Hydrochlorothiazide Other (See Comments)    Dehydration   Hydrocodone Nausea Only and Other (See Comments)    Stomach upset    Hydrocodone-Acetaminophen Nausea Only    Stomach upset   Sulfa Antibiotics Other (See Comments)    Cannot recall   Penicillins Hives, Rash and Other (See Comments)    Has patient had a PCN reaction causing immediate rash, facial/tongue/throat swelling, SOB or lightheadedness with hypotension: YES  Has patient had a PCN reaction causing severe rash involving mucus membranes or skin necrosis: NO  Has patient had a PCN reaction that required hospitalization NO  Has patient had a PCN reaction occurring within the last 10 years:NO  If all of the above answers are "NO", then may proceed with Cephalosporin use.  Has patient had a PCN reaction causing immediate rash, facial/tongue/throat swelling, SOB or lightheadedness with hypotension: YES Has patient had a PCN reaction causing severe rash involving mucus membranes or skin necrosis: NO Has patient had a PCN reaction that required hospitalization NO Has patient had a PCN reaction occurring within the last 10 years:NO If all of the above answers are "NO", then may proceed with Cephalosporin use.     Home Medications  Prior to Admission medications   Medication Sig Start Date End Date Taking? Authorizing Provider  acetaminophen (TYLENOL) 650 MG CR tablet Take 1,300 mg by mouth at bedtime.   Yes [provider]  allopurinol (ZYLOPRIM) 100 MG tablet TAKE 1 TABLET BY MOUTH EVERYDAY AT BEDTIME 07/08/23  Yes Reubin Milan, MD  amiodarone (PACERONE) 200 MG tablet TAKE 1 TABLET BY MOUTH EVERY DAY 04/22/23  Yes Kathleene Hazel, MD  atorvastatin (LIPITOR) 40 MG tablet TAKE 1 TABLET BY MOUTH EVERYDAY AT BEDTIME 07/15/23  Yes Judithann Graves,  Nyoka Cowden, MD  Cholecalciferol (VITAMIN D-3) 125 MCG (5000 UT) TABS Take 2,000 Units by mouth  daily.    Yes [provider]  diphenhydrAMINE (BENADRYL) 50 MG capsule Take 50 mg by mouth at bedtime.   Yes [provider]  ELIQUIS 5 MG TABS tablet TAKE 1 TABLET BY MOUTH TWICE A DAY 05/23/23  Yes Kathleene Hazel, MD  furosemide (LASIX) 40 MG tablet Take 40 mg by mouth daily as needed for edema. Taking 1 1/2 tablets (60mg  total) Pt takes on Tue. Thur. Sat.and Sunday's. 10/12/22  Yes [provider]  levothyroxine (SYNTHROID) 75 MCG tablet TAKE 1 TABLET BY MOUTH EVERY DAY BEFORE BREAKFAST 07/19/23  Yes Reubin Milan, MD  metoprolol succinate (TOPROL XL) 100 MG 24 hr tablet Take 1 tablet (100 mg total) by mouth daily. Take with or immediately following a meal. 05/03/23 05/02/24 Yes Romie Minus, MD  Multiple Vitamin (MULTIVITAMIN PO) Take 1 tablet by mouth daily.   Yes [provider]  nitroGLYCERIN (NITROSTAT) 0.4 MG SL tablet Place 0.4 mg under the tongue every 5 (five) minutes as needed for chest pain (Up to 3 times).   Yes [provider]  pantoprazole (PROTONIX) 40 MG tablet Take 1 tablet (40 mg total) by mouth 2 (two) times daily. Home med. 07/23/23  Yes Reubin Milan, MD  senna (SENOKOT) 8.6 MG tablet Take 1 tablet by mouth as needed for constipation.   Yes [provider]     Critical care time: 32 minutes     Raechel Chute, MD [place] 08/06/2023 3:49 PM

## 2023-08-06 NOTE — Consult Note (Signed)
 PHARMACY CONSULT NOTE - ELECTROLYTES  Pharmacy Consult for Electrolyte Monitoring and Replacement   Recent Labs: Height: 5\' 9"  (175.3 cm) Weight: 92.1 kg (203 lb) IBW/kg (Calculated) : 70.7 Estimated Creatinine Clearance: 17.7 mL/min (A) (by C-G formula based on SCr of 3.79 mg/dL (H)). Potassium (mmol/L)  Date Value  08/05/2023 3.8   Magnesium (mg/dL)  Date Value  16/03/9603 1.9   Calcium (mg/dL)  Date Value  54/01/8118 7.7 (L)   Albumin (g/dL)  Date Value  14/78/2956 2.9 (L)  11/24/2021 4.4   Phosphorus (mg/dL)  Date Value  21/30/8657 3.8   Sodium  Date Value  08/05/2023 136 mmol/L  09/06/2022 143    Assessment  Ronald [name] is a 78 y.o. male presenting with dizziness with near syncopal episodes. PMH significant for CAD s/p CABG in 1996 (LIMA-LAD, SVG-circumflex, SVG-RCA, SVG-diagonal) with redo bypass in 2007 (SVG-OM, SVG-RCA), ischemic cardiomyopathy, ESRD on HD, PVD DM 2, HTN, HLD, Gout, AAA, renal artery stenosis, COPD, GERD, Hypothyroidism, atrial fibrillation and PVCs. Pharmacy has been consulted to monitor and replace electrolytes.  Diet: NPO  Goal of Therapy: Electrolytes WNL  Plan:  No replacement indicated Check BMP, Mg, Phos with AM labs  Thank you for allowing pharmacy to be a part of this patient's care.  Bettey Costa, PharmD Clinical Pharmacist 08/06/2023 7:10 AM

## 2023-08-06 NOTE — ED Notes (Signed)
 Wife at bedside.

## 2023-08-06 NOTE — ED Notes (Addendum)
 Provider notified of pts heart rate of 47 and bp of 98/41. Provider aware. No new orders at this time

## 2023-08-06 NOTE — ED Notes (Signed)
 CBG 84. Wife stated " patient has not eaten all day." RN was notified.

## 2023-08-06 NOTE — ED Notes (Signed)
 Pulse ox is 99%,not 68%.

## 2023-08-06 NOTE — ED Notes (Signed)
 Patient given his dinner tray and readjusted on the bed. Patient is calm, alert and oriented.

## 2023-08-07 ENCOUNTER — Encounter: Payer: Self-pay | Admitting: Student in an Organized Health Care Education/Training Program

## 2023-08-07 DIAGNOSIS — I495 Sick sinus syndrome: Secondary | ICD-10-CM

## 2023-08-07 DIAGNOSIS — D62 Acute posthemorrhagic anemia: Secondary | ICD-10-CM | POA: Diagnosis not present

## 2023-08-07 DIAGNOSIS — N186 End stage renal disease: Secondary | ICD-10-CM | POA: Diagnosis not present

## 2023-08-07 DIAGNOSIS — I959 Hypotension, unspecified: Secondary | ICD-10-CM

## 2023-08-07 DIAGNOSIS — R55 Syncope and collapse: Secondary | ICD-10-CM | POA: Diagnosis not present

## 2023-08-07 DIAGNOSIS — I480 Paroxysmal atrial fibrillation: Secondary | ICD-10-CM

## 2023-08-07 DIAGNOSIS — I9589 Other hypotension: Secondary | ICD-10-CM

## 2023-08-07 LAB — URINE CULTURE

## 2023-08-07 LAB — BASIC METABOLIC PANEL
Anion gap: 9 (ref 5–15)
BUN: 44 mg/dL — ABNORMAL HIGH (ref 8–23)
CO2: 25 mmol/L (ref 22–32)
Calcium: 8.3 mg/dL — ABNORMAL LOW (ref 8.9–10.3)
Chloride: 99 mmol/L (ref 98–111)
Creatinine, Ser: 6.65 mg/dL — ABNORMAL HIGH (ref 0.61–1.24)
GFR, Estimated: 8 mL/min — ABNORMAL LOW (ref >60)
Glucose, Bld: 93 mg/dL (ref 70–99)
Potassium: 4.4 mmol/L (ref 3.5–5.1)
Sodium: 133 mmol/L — ABNORMAL LOW (ref 135–145)

## 2023-08-07 LAB — PHOSPHORUS: Phosphorus: 6.3 mg/dL — ABNORMAL HIGH (ref 2.5–4.6)

## 2023-08-07 LAB — MAGNESIUM: Magnesium: 2 mg/dL (ref 1.7–2.4)

## 2023-08-07 LAB — HEMOGLOBIN AND HEMATOCRIT, BLOOD
HCT: 25.6 % — ABNORMAL LOW (ref 39.0–52.0)
Hemoglobin: 8.4 g/dL — ABNORMAL LOW (ref 13.0–17.0)

## 2023-08-07 LAB — CBC
HCT: 25.6 % — ABNORMAL LOW (ref 39.0–52.0)
Hemoglobin: 8.4 g/dL — ABNORMAL LOW (ref 13.0–17.0)
MCH: 32.1 pg (ref 26.0–34.0)
MCHC: 32.8 g/dL (ref 30.0–36.0)
MCV: 97.7 fL (ref 80.0–100.0)
Platelets: 122 10*3/uL — ABNORMAL LOW (ref 150–400)
RBC: 2.62 MIL/uL — ABNORMAL LOW (ref 4.22–5.81)
RDW: 18.3 % — ABNORMAL HIGH (ref 11.5–15.5)
WBC: 7.5 10*3/uL (ref 4.0–10.5)
nRBC: 0.3 % — ABNORMAL HIGH (ref 0.0–0.2)

## 2023-08-07 LAB — CBG MONITORING, ED
Glucose-Capillary: 83 mg/dL (ref 70–99)
Glucose-Capillary: 85 mg/dL (ref 70–99)

## 2023-08-07 MED ORDER — CHLORHEXIDINE GLUCONATE CLOTH 2 % EX PADS
6.0000 | MEDICATED_PAD | Freq: Every day | CUTANEOUS | Status: DC
  Administered 2023-08-09: 6 via TOPICAL

## 2023-08-07 MED ORDER — ATORVASTATIN CALCIUM 20 MG PO TABS
40.0000 mg | ORAL_TABLET | Freq: Every day | ORAL | Status: DC
  Administered 2023-08-07 – 2023-08-09 (×3): 40 mg via ORAL
  Filled 2023-08-07 (×3): qty 2

## 2023-08-07 MED ORDER — VITAMIN D 25 MCG (1000 UNIT) PO TABS
2000.0000 [IU] | ORAL_TABLET | Freq: Every day | ORAL | Status: DC
  Administered 2023-08-08 – 2023-08-09 (×2): 2000 [IU] via ORAL
  Filled 2023-08-07 (×2): qty 2

## 2023-08-07 MED ORDER — ADULT MULTIVITAMIN W/MINERALS CH
1.0000 | ORAL_TABLET | Freq: Every day | ORAL | Status: DC
  Administered 2023-08-07 – 2023-08-09 (×3): 1 via ORAL
  Filled 2023-08-07 (×3): qty 1

## 2023-08-07 MED ORDER — ALLOPURINOL 100 MG PO TABS
100.0000 mg | ORAL_TABLET | Freq: Every day | ORAL | Status: DC
  Administered 2023-08-07 – 2023-08-08 (×2): 100 mg via ORAL
  Filled 2023-08-07 (×3): qty 1

## 2023-08-07 NOTE — Consult Note (Signed)
 PHARMACY CONSULT NOTE - ELECTROLYTES  Pharmacy Consult for Electrolyte Monitoring and Replacement   Recent Labs: Height: 5\' 9"  (175.3 cm) Weight: 92.1 kg (203 lb) IBW/kg (Calculated) : 70.7 Estimated Creatinine Clearance: 10.1 mL/min (A) (by C-G formula based on SCr of 6.65 mg/dL (H)). Potassium (mmol/L)  Date Value  08/07/2023 4.4   Magnesium (mg/dL)  Date Value  09/81/1914 2.0   Calcium (mg/dL)  Date Value  78/29/5621 8.3 (L)   Albumin (g/dL)  Date Value  30/86/5784 2.9 (L)  11/24/2021 4.4   Phosphorus (mg/dL)  Date Value  69/62/9528 6.3 (H)   Sodium  Date Value  08/07/2023 133 mmol/L (L)  09/06/2022 143    Assessment  Ronald [name] is a 78 y.o. male presenting with dizziness with near syncopal episodes. PMH significant for CAD s/p CABG in 1996 (LIMA-LAD, SVG-circumflex, SVG-RCA, SVG-diagonal) with redo bypass in 2007 (SVG-OM, SVG-RCA), ischemic cardiomyopathy, ESRD on HD, PVD DM 2, HTN, HLD, Gout, AAA, renal artery stenosis, COPD, GERD, Hypothyroidism, atrial fibrillation and PVCs. Pharmacy has been consulted to monitor and replace electrolytes.  Diet: NPO  Goal of Therapy: Electrolytes WNL  Plan:  No replacement indicated Of note, patient receives hemodialysis 3x/week. Has not received any sessions this admission so far. Check BMP, Mg, Phos with AM labs  Thank you for allowing pharmacy to be a part of this patient's care.  Bettey Costa, PharmD Clinical Pharmacist 08/07/2023 8:12 AM

## 2023-08-07 NOTE — Consult Note (Signed)
 Cardiology Consult    Patient ID: Ronald [name] [facility] MRN: [id number], DOB/AGE: [birth year]   Admit date: 08/05/2023 Date of Consult: 08/07/2023  Primary Physician: Reubin Milan, MD Primary Cardiologist: Verne Carrow, MD Requesting Provider: Kathie Rhodes. Patel  Patient Profile    Ronald [name] is a 78 y.o. male with a history of CAD s/p CABG, redo CABG, and multiple PCIs, ICM,  EF 20 to 25%, HFrEF, HTN, HL, PAF on eliquis/amio, DMII, ESRD on HD, prior tob abuse, COPD, GERD, AAA, PAD, RAS, hypothyroidism, PVCs, and gout, who is being seen today for the evaluation of bradycardia, hypertension and anemia in the setting of eliquis and lip laceration at the request of Dr. Allena Katz.  Past Medical History  Subjective  Past Medical History:  Diagnosis Date   AAA (abdominal aortic aneurysm) (HCC)    a. 3cm by [place] 2015; b. 07/2023 Ao U/S: Abd Ao 2.6cm.   Arthritis    "hips; back" (12/13/2014)   CAD (coronary artery disease)    a. 1996 s/p CABGx4: LIMA-LAD, VG-Cx, VG-RCA, VG-diag; b. 2007 s/p redo CABGx2: VG-OM, VG-RCA due to VG disease; c. NSTEMI 11/2014 s/p DES to SVG-OM from the Y graft; d. 09/2015 PCI: distal body of SVG-Diag; e. 07/2017 Cath: LAD 100p, LCX 100p, RCA 95ost/100p, VG->dRCA 10p ISR, 30p/m/d, Y graft->OM3 (ok) & D2 46m, patent distal stent, LIMA->LAD ok, VG->OM2 100, VG->RPAV 100-->Med rx.   Chronic combined systolic and diastolic CHF (congestive heart failure) (HCC)    a. remote EF 40-45% in 2006. b. Normal EF 2014. c. Echo 07/2016 EF 45-50%, grade 1 DD. d. Echo 2020 30% to 35%; e. 03/2023 Echo: EF 20-25%, glob HK, inf wall best preserved. Mildly reduced RV fxn, RVSP 39.15mmHg. Mod dil LA. Mod MR/TR. AoV sclerosis.   Chronic lower back pain    CKD (chronic kidney disease), stage IV (HCC)    COPD (chronic obstructive pulmonary disease) (HCC)    Deafness in left ear    Degenerative disc disease, lumbar    Diabetes mellitus, type 2 (HCC) 10/04/2014   Microalbumin 05/11/2012-100. Foot  exam/monofilament 05/11/2012-normal.   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 CHA2DS2VASc = 6-->eliquis.  On Amio.   PVC's (premature ventricular contractions)    Renal artery stenosis (HCC)    a. noted on CT 2008.   Type II diabetes mellitus (HCC)    Diet control    Unstable angina (HCC) 07/27/2017   Walking pneumonia 1990's   Wears dentures    full upper    Past Surgical History:  Procedure Laterality Date   CARDIAC CATHETERIZATION  "several"   CARDIAC CATHETERIZATION N/A 12/13/2014   Procedure: Left Heart Cath and Coronary Angiography;  Surgeon: Corky Crafts, MD;  Location: [facility] INVASIVE CV LAB;  Service: Cardiovascular;  Laterality: N/A;   CARDIAC CATHETERIZATION  12/13/2014   Procedure: Coronary Stent Intervention;  Surgeon: Corky Crafts, MD;  Location: [facility] INVASIVE CV LAB;  Service: Cardiovascular;;  CARDIAC CATHETERIZATION N/A 10/07/2015   Procedure: Left Heart Cath and Cors/Grafts Angiography;  Surgeon: Kathleene Hazel, MD;  Location: [facility] INVASIVE CV LAB;  Service: Cardiovascular;  Laterality: N/A;   CARDIAC CATHETERIZATION N/A 10/07/2015   Procedure: Coronary Stent Intervention;  Surgeon: Kathleene Hazel, MD;  Location: [facility] INVASIVE CV LAB;  Service: Cardiovascular;  Laterality: N/A;   CARDIOVERSION N/A 05/25/2022   Procedure: CARDIOVERSION;  Surgeon: Thomasene Ripple, DO;  Location: [facility] ENDOSCOPY;  Service: Cardiovascular;  Laterality:  N/A;   CATARACT EXTRACTION W/PHACO Left 10/21/2021   Procedure: CATARACT EXTRACTION PHACO AND INTRAOCULAR LENS PLACEMENT (IOC) LEFT 3.94 00:33.7;  Surgeon: Nevada Crane, MD;  Location: [facility] SURGERY CNTR;  Service: Ophthalmology;  Laterality: Left;   CATARACT EXTRACTION W/PHACO Right 11/09/2021   Procedure: CATARACT EXTRACTION PHACO AND INTRAOCULAR LENS PLACEMENT (IOC) RIGHT;  Surgeon: Nevada Crane, MD;  Location: [facility] SURGERY CNTR;  Service: Ophthalmology;  Laterality: Right;  3.59 0:29.4   CORONARY ANGIOPLASTY  "several"   CORONARY ANGIOPLASTY WITH STENT PLACEMENT  2005; 12/13/2014   "2; 1"   CORONARY ARTERY BYPASS GRAFT  05/31/1994   CABG X5   CORONARY ARTERY BYPASS GRAFT  07/29/2005   CABG X3   DIALYSIS/PERMA CATHETER INSERTION N/A 09/10/2022   Procedure: DIALYSIS/PERMA CATHETER INSERTION;  Surgeon: Renford Dills, MD;  Location: [facility] INVASIVE CV LAB;  Service: Cardiovascular;  Laterality: N/A;   ESOPHAGOGASTRODUODENOSCOPY (EGD) WITH ESOPHAGEAL DILATION  05/31/1998   GREEN LIGHT LASER TURP (TRANSURETHRAL RESECTION OF PROSTATE  01/30/1999   "not cancerous"   HERNIA REPAIR     LAPAROSCOPIC CHOLECYSTECTOMY     LEFT HEART CATH AND CORS/GRAFTS ANGIOGRAPHY N/A 07/28/2017   Procedure: LEFT HEART CATH AND CORS/GRAFTS ANGIOGRAPHY;  Surgeon: Lennette Bihari, MD;  Location: [facility] INVASIVE CV LAB;  Service: Cardiovascular;  Laterality: N/A;   LUNG SURGERY  05/31/1994   "S/P CABG, had to put staple in lung after it had collapsed"   MINOR REMOVAL OF PERITONEAL DIALYSIS CATHETER  10/2022   UMBILICAL HERNIA REPAIR     w/chole     Allergies  Allergies  Allergen Reactions   Predicort [Prednisolone] Other (See Comments)    Stomach pain   Ciprofloxacin Other (See Comments)    GI upset   Hydrochlorothiazide Other (See Comments)    Dehydration   Hydrocodone Nausea Only and Other (See Comments)    Stomach upset    Hydrocodone-Acetaminophen Nausea Only    Stomach upset   Sulfa  Antibiotics Other (See Comments)    Cannot recall   Penicillins Hives, Rash and Other (See Comments)    Has patient had a PCN reaction causing immediate rash, facial/tongue/throat swelling, SOB or lightheadedness with hypotension: YES  Has patient had a PCN reaction causing severe rash involving mucus membranes or skin necrosis: NO  Has patient had a PCN reaction that required hospitalization NO  Has patient had a PCN reaction occurring within the last 10 years:NO  If all of the above answers are "NO", then may proceed with Cephalosporin use.  Has patient had a PCN reaction causing immediate rash, facial/tongue/throat swelling, SOB or lightheadedness with hypotension: YES Has patient had a PCN reaction causing severe rash involving mucus membranes or skin necrosis: NO Has patient had a PCN reaction that required hospitalization NO Has patient had a PCN reaction occurring within the last 10 years:NO If all of the above answers are "NO", then may proceed with Cephalosporin use.      History of Present Illness   78  y.o. male with a history of CAD s/p CABG, redo CABG, and multiple PCIs, ICM, HFrEF, HTN, HL, PAF on eliquis/amio, DMII, ESRD on HD, prior tob abuse, COPD, GERD, AAA, PAD, RAS, hypothyroidism, PVCs, and gout.  He initially underwent CABG x 4 in 1996 but required redo CABG x 2 in 2007, in the setting of severe vein graft dzs.  He has since undergone PCI and most recent cath in 07/2017, showed occlusive LAD/LCX/RCA dzs w/ patent LIMA  LAD, patent Y graft to the D2 and OM3, patent VG  Distal RCA, and occlusions of the VG  OM2 and VG  RPAV.  Overall, anatomy was felt to be stable, and he's been medically managed since.  Most recent echo in 03/2023 showed progression of LV dysfxn w/ EF of 20-25%, glob HK, though inferior wall moved best, and mod MR/TR.  He has since been followed by advanced HF clinic, last seen in 05/2023, at which time he was doing well.  Unfortunately, Mr. Lindblad has had  multiple ED visit recently due to uncontrolled bleeding from a lip laceration.  This was initially sutured 07/22/2023, but in the setting of ongoing bleeding, he required repeat ED evals on 07/23/2023 and again on 07/31/2023, each time w/ bleeding resolving and pt being d/c'd from the ED.    On 3/7, while @ HD x 3.5 hrs, he experienced near syncope in the setting of hypotension and BPs in the 80's.  EMS was called and he was given 500 ml IVF bolus.  In the ED, he was bradycardic @ 54 w/ ECG likely showing sinus brady (baseline artifact makes interpretation challenging) w/o acute ST/T changes.  BP remained soft.  Labs notable for lactic acid of 2.5, H/H 7.2/22.6.  TSH mildly high @ 6.584.  HsTrop 67  71.  CXR w/ mild atx/infiltrate and sm L pleural effusion.  He was transfused w/ 1 unit of PRBCs w/ plan for admission to ICU.  Home doses of amio, metoprolol, and eliquis have been held, and he has been rx midodrine w/ stable BPs in the low 100's to 120's.  He has remained bradycardic since arrival w/ rates frequently in the high 40's.  H/H relatively stable - 8.4/25.6 this AM.   since admission blood pressure has remained low, heart rate low at 50 sinus rhythm off metoprolol, off amiodarone  Eliquis held, bleeding stopped, hemoglobin stabilized 8 range  Inpatient Medications  Subjective    allopurinol  100 mg Oral QHS   atorvastatin  40 mg Oral Daily   [START ON 08/08/2023] cholecalciferol  2,000 Units Oral Daily   insulin aspart  0-6 Units Subcutaneous Q4H   levothyroxine  75 mcg Oral Q0600   midodrine  5 mg Oral TID WC   multivitamin with minerals  1 tablet Oral Daily   pantoprazole  40 mg Oral BID    Family History    Family History  Problem Relation Age of Onset   Heart attack Mother        MI   Stroke Mother    Heart disease Mother    Hypertension Mother    Hyperlipidemia Mother    Asthma Mother    Heart disease Father    Rheumatic fever Father    Colon cancer Neg Hx    He indicated  that his mother is deceased. He indicated that his father is deceased. He indicated that his brother is alive. He indicated that his maternal grandmother is deceased. He indicated that his maternal grandfather is  deceased. He indicated that his paternal grandmother is deceased. He indicated that his paternal grandfather is deceased. He indicated that both of his sons are alive. He indicated that the status of his neg hx is unknown.   Social History    Social History   Socioeconomic History   Marital status: Married    Spouse name: Woodrow Drab   Number of children: 2   Years of education: Not on file   Highest education level: 8th grade  Occupational History   Occupation: Retired  Tobacco Use   Smoking status: Former    Current packs/day: 0.00    Average packs/day: 3.0 packs/day for 20.0 years (60.0 ttl pk-yrs)    Types: Cigarettes    Start date: 07/25/1966    Quit date: 07/25/1986    Years since quitting: 37.0   Smokeless tobacco: Never  Vaping Use   Vaping status: Never Used  Substance and Sexual Activity   Alcohol use: No    Alcohol/week: 0.0 standard drinks of alcohol   Drug use: No   Sexual activity: Not Currently  Other Topics Concern   Not on file  Social History Narrative   Did auto salvage work.   Lives at home with his wife.  Independent at baseline.   Social Drivers of Corporate investment banker Strain: Low Risk  (06/23/2023)   Overall Financial Resource Strain (CARDIA)    Difficulty of Paying Living Expenses: Not hard at all  Food Insecurity: No Food Insecurity (06/23/2023)   Hunger Vital Sign    Worried About Running Out of Food in the Last Year: Never true    Ran Out of Food in the Last Year: Never true  Transportation Needs: No Transportation Needs (06/23/2023)   PRAPARE - Administrator, Civil Service (Medical): No    Lack of Transportation (Non-Medical): No  Physical Activity: Inactive (06/23/2023)   Exercise Vital Sign    Days of Exercise  per Week: 0 days    Minutes of Exercise per Session: 0 min  Stress: No Stress Concern Present (06/23/2023)   [name] of Occupational Health - Occupational Stress Questionnaire    Feeling of Stress : Only a little  Social Connections: Moderately Isolated (06/23/2023)   Social Connection and Isolation Panel [NHANES]    Frequency of Communication with Friends and Family: Twice a week    Frequency of Social Gatherings with Friends and Family: Three times a week    Attends Religious Services: Never    Active Member of Clubs or Organizations: No    Attends Banker Meetings: Never    Marital Status: Married  Catering manager Violence: Not At Risk (06/23/2023)   Humiliation, Afraid, Rape, and Kick questionnaire    Fear of Current or Ex-Partner: No    Emotionally Abused: No    Physically Abused: No    Sexually Abused: No     Review of Systems    General:  No chills, fever, night sweats or weight changes.  Cardiovascular:  No chest pain, dyspnea on exertion, edema, orthopnea, palpitations, paroxysmal nocturnal dyspnea. Dermatological: No rash, lesions/masses Respiratory: No cough, dyspnea Urologic: No hematuria, dysuria Abdominal:   No nausea, vomiting, diarrhea, bright red blood per rectum, melena, or hematemesis Neurologic:  No visual changes, wkns, changes in mental status. All other systems reviewed and are otherwise negative except as noted above.    Objective  Physical Exam    Blood pressure (!) 112/47, pulse (!) 48, temperature 98.1 F (36.7 C),  temperature source Oral, resp. rate 15, height 5\' 9"  (1.753 m), weight 92.1 kg, SpO2 100%.  General: Pleasant, NAD Psych: Normal affect. Neuro: Alert and oriented X 3. Moves all extremities spontaneously. HEENT: Normal  Neck: Supple without bruits or JVD. Lungs:  Resp regular and unlabored, CTA. Heart: RRR no s3, s4, or murmurs. Abdomen: Soft, non-tender, non-distended, BS + x 4.  Extremities: No clubbing,  cyanosis or edema. DP/PT2+, Radials 2+ and equal bilaterally.  Labs    Cardiac Enzymes Recent Labs  Lab 08/05/23 1827 08/05/23 2039  TROPONINIHS 71* 67*     BNP    Component Value Date/Time   BNP 1,700.3 (H) 09/13/2022 1429    ProBNP    Component Value Date/Time   PROBNP 1,020 (H) 10/10/2019 1557   PROBNP 116.1 12/30/2012 2011    Lab Results  Component Value Date   WBC 7.5 08/07/2023   HGB 8.4 (L) 08/07/2023   HCT 25.6 (L) 08/07/2023   MCV 97.7 08/07/2023   PLT 122 (L) 08/07/2023    Recent Labs  Lab 08/05/23 1827 08/06/23 0627 08/07/23 0500  NA 136   < > 133*  K 3.8   < > 4.4  CL 100   < > 99  CO2 23   < > 25  BUN 23   < > 44*  CREATININE 3.79*   < > 6.65*  CALCIUM 7.7*   < > 8.3*  PROT 5.7*  --   --   BILITOT 1.0  --   --   ALKPHOS 121  --   --   ALT 26  --   --   AST 39  --   --   GLUCOSE 132*   < > 93   < > = values in this interval not displayed.   Lab Results  Component Value Date   CHOL 127 07/19/2023   HDL 49 07/19/2023   LDLCALC 65 07/19/2023   TRIG 59 07/19/2023     Radiology Studies    DG Chest Port 1 View Result Date: 08/05/2023 CLINICAL DATA:  Questionable sepsis. EXAM: PORTABLE CHEST 1 VIEW COMPARISON:  July 22, 2023 FINDINGS: There is stable right-sided venous catheter positioning. Multiple sternal wires and vascular clips are seen. The cardiac silhouette is enlarged and unchanged in size. Mild atelectasis and/or infiltrate is suspected within the retrocardiac region of the left lung base. There is a small left pleural effusion. No pneumothorax is identified. Multilevel degenerative changes seen throughout the thoracic spine. IMPRESSION: 1. Evidence of prior median sternotomy/CABG. 2. Mild left basilar atelectasis and/or infiltrate. 3. Small left pleural effusion. Electronically Signed   By: Aram Candela M.D.   On: 08/05/2023 22:08      ECG & Cardiac Imaging    Probably sinus brady @ 54, baseline artifact, inflat ST depression  (old), IVCD - personally reviewed.  Assessment & Plan    1.  Bradycardia: - sinus rhythm rate 50 off metoprolol and amiodarone past several days - he does have  mildchronotropic competence, improved heart rate out of bed - though heart rate remains inappropriately low given severely reduced LV output - would continue to hold both for now - will likely need to restart metoprolol  and amiodarone at reduced dosing  2.  Anemia/Recurrent bleeding from lip laceration:   Eliquis on hold.  S/p 1 unit prbcs.  H/H remains low, but stable @ 8.4/25.6 - - will recommend restarting Eliquis several days after stitches removed possibly tomorrow  3.  CAD/Demand Ischemia:  s/p CABG x 4 in 1996, redo CABG x 2 in 2007, PCI's, and most recent cath in 2019 w/ severe native vessel occlusive disease and patent LIMA  LAD, VG  dRCA, and Y graft to OM3 and D2.  He has been medically managed since.  In setting of anemia  and hypotension with bradycardiaon presentation, hsTrops mildly elevated @ 71  67, demand ischemia, no further ischemic workup needed.    4.  Chronic HFrEF/ICM:  EF 20-25% by echo in 03/2023.  Followed by CHF clinic w/ plan for PYP scan and further discussion re: ICD in the future.  No I/O or wts since admission.  Volume mgmt per nephrology via HD.  GDMT on hold in setting of hypotension.  5.  HTN Hypotension: Metoprolol on hold Consider midodrine prior to dialysis  6.  HL:   on statin.  7.  ESRD:   HD per nephrology. Given hypotension during dialysis and following treatment, consider midodrine prior to HD -May need to increase dry weight slightly  8.  DMII:   per medicine team.  9.  PAF:   Eliquis on hold in setting of ABL anemia.   CHA2DS2VASc = 6. -Please start a very low-dose amiodarone and metoprolol succinate once heart rate improves  10.  Acute blood loss anemia:   s/p 1 unit PRBCs on admission.  H/H low but stable. Restart of Eliquis during the week several days after stitches  removed  Signed, Dossie Arbour, MD, Ph.D [facility]  For questions or updates, please contact   Please consult [email or web address] for contact info under Cardiology/STEMI.

## 2023-08-07 NOTE — Progress Notes (Signed)
 [facility]  - [place] at [facility]   PATIENT NAME: Ronald [name]    MR#:  [id number]  DATE OF BIRTH:  November 22, 1945  SUBJECTIVE:  wife at bedside. Patient transferred out of ICU. He had come in the [place] visiting the ER with bleeding from laceration/tongue bite several days ago and being on eliquis. Patient came in with hemoglobin of 7.2 received one unit blood transfusion hemoglobin has been stable no oral bleeding. Wife is requesting if the suture on the right oral mucosa can be removed since it was placed last Sunday. Patient tolerating PO diet denies any dizziness.   VITALS:  Blood pressure (!) 116/50, pulse (!) 50, temperature 97.8 F (36.6 C), temperature source Oral, resp. rate 15, height 5\' 9"  (1.753 m), weight 92.1 kg, SpO2 100%.  PHYSICAL EXAMINATION:   GENERAL:  78 y.o.-year-old patient with no acute distress. Morbidly obese oral mucosa normal. Suture present on the right buccal cavity. No bleeding noted. LUNGS: Normal breath sounds bilaterally CARDIOVASCULAR: S1, S2 normal. No murmur   ABDOMEN: Soft, nontender, nondistended. Bowel sounds present.  EXTREMITIES: No  edema b/l.   HD access+ NEUROLOGIC: nonfocal  patient is alert and awake   LABORATORY PANEL:  CBC Recent Labs  Lab 08/07/23 0500 08/07/23 1335  WBC 7.5  --   HGB 8.4* 8.4*  HCT 25.6* 25.6*  PLT 122*  --     Chemistries  Recent Labs  Lab 08/05/23 1827 08/06/23 0627 08/07/23 0500  NA 136   < > 133*  K 3.8   < > 4.4  CL 100   < > 99  CO2 23   < > 25  GLUCOSE 132*   < > 93  BUN 23   < > 44*  CREATININE 3.79*   < > 6.65*  CALCIUM 7.7*   < > 8.3*  MG  --    < > 2.0  AST 39  --   --   ALT 26  --   --   ALKPHOS 121  --   --   BILITOT 1.0  --   --    < > = values in this interval not displayed.   Cardiac Enzymes No results for input(s): "TROPONINI" in the last 168 hours. RADIOLOGY:  DG Chest Port 1 View Result Date: 08/05/2023 CLINICAL DATA:  Questionable sepsis. EXAM:  PORTABLE CHEST 1 VIEW COMPARISON:  July 22, 2023 FINDINGS: There is stable right-sided venous catheter positioning. Multiple sternal wires and vascular clips are seen. The cardiac silhouette is enlarged and unchanged in size. Mild atelectasis and/or infiltrate is suspected within the retrocardiac region of the left lung base. There is a small left pleural effusion. No pneumothorax is identified. Multilevel degenerative changes seen throughout the thoracic spine. IMPRESSION: 1. Evidence of prior median sternotomy/CABG. 2. Mild left basilar atelectasis and/or infiltrate. 3. Small left pleural effusion. Electronically Signed   By: Aram Candela M.D.   On: 08/05/2023 22:08    Assessment and Plan 78 y.o male with significant PMH of CAD s/p CABG in 1996 (LIMA-LAD, SVG-circumflex, SVG-RCA, SVG-diagonal) with redo bypass in 2007 (SVG-OM, SVG-RCA), ischemic cardiomyopathy, ESRD on HD, PVD DM 2, HTN, HLD, Gout, AAA, renal artery stenosis, COPD, GERD, Hypothyroidism, atrial fibrillation and PVCs who presented to the ED with chief complaints of dizziness with near syncopal episodes.   Patient came in after he had accidentally bitten his lip while he was asleep and had three emergency room visits on different occasions due to bleeding from  the oral mucosa. Suture was placed last Sunday from the area there was a bit on the right buccal cavity. Eliquis has been hold for last several days.  Patient came in with hemoglobin of 7.2 received one unit blood transfusion hemoglobin has been stable since  Acute on chronic blood loss anemia due to laceration and patient being on eliquis oral mucosa laceration due to tongue bite accidental -- patient came in with hemoglobin of 7.2-- one unit blood transfusion--- hemoglobin has been above 8.0. No bleeding noted -- per patient's wife suture on the right oral mucosa/buccal cavity was placed in the ER last Sunday. Suture needs to be removed. Currently no active  bleeding  Syncope suspected due to blood loss anemia/hypotension end-stage renal disease on hemodialysis -- patient currently appears hemodynamically stable how were blood pressure on the softer side -- nephrology consultation placed for in-house dialysis --midodrine tid  History of a fib with slow ventricular response chronic anticoagulation -- patient at home on amiodarone, beta-blocker-- currently on hold -- eliquis--- on hold -- cardiology consultation with Dr. Mariah Milling  End-stage renal disease on hemodialysis -- nephrology consultation for in-house dialysis  Hypothyroidism -- resume Synthroid  Type II diabetes, well-controlled -- most recent A1c 4.7 -- not on any meds at home -- discontinue sliding scale. Will do daily CBG  COPD/obstructive sleep apnea -- stable  Procedures: Family communication : wife at bedside Consults : [facility] cardiology, nephrology CODE STATUS: full DVT Prophylaxis : eliquis currently on hold Level of care: Progressive Status is: Inpatient Remains inpatient appropriate because: slow ventricular response, dialysis, anemia    TOTAL TIME TAKING CARE OF THIS PATIENT: 50 minutes.  >50% time spent on counselling and coordination of care  Note: This dictation was prepared with Dragon dictation along with smaller phrase technology. Any transcriptional errors that result from this process are unintentional.  Enedina Finner M.D    [facility]   CC: Primary care physician; Reubin Milan, MD

## 2023-08-08 ENCOUNTER — Other Ambulatory Visit (HOSPITAL_COMMUNITY): Payer: Self-pay

## 2023-08-08 ENCOUNTER — Telehealth (HOSPITAL_COMMUNITY): Payer: Self-pay | Admitting: Pharmacy Technician

## 2023-08-08 DIAGNOSIS — I5023 Acute on chronic systolic (congestive) heart failure: Secondary | ICD-10-CM

## 2023-08-08 DIAGNOSIS — N186 End stage renal disease: Secondary | ICD-10-CM | POA: Diagnosis not present

## 2023-08-08 DIAGNOSIS — R55 Syncope and collapse: Secondary | ICD-10-CM | POA: Diagnosis not present

## 2023-08-08 DIAGNOSIS — D62 Acute posthemorrhagic anemia: Secondary | ICD-10-CM | POA: Diagnosis not present

## 2023-08-08 DIAGNOSIS — I495 Sick sinus syndrome: Secondary | ICD-10-CM | POA: Diagnosis not present

## 2023-08-08 LAB — HEMOGLOBIN AND HEMATOCRIT, BLOOD
HCT: 22.5 % — ABNORMAL LOW (ref 39.0–52.0)
HCT: 24 % — ABNORMAL LOW (ref 39.0–52.0)
Hemoglobin: 7.6 g/dL — ABNORMAL LOW (ref 13.0–17.0)
Hemoglobin: 7.9 g/dL — ABNORMAL LOW (ref 13.0–17.0)

## 2023-08-08 LAB — RENAL FUNCTION PANEL
Albumin: 2.8 g/dL — ABNORMAL LOW (ref 3.5–5.0)
Anion gap: 14 (ref 5–15)
BUN: 58 mg/dL — ABNORMAL HIGH (ref 8–23)
CO2: 22 mmol/L (ref 22–32)
Calcium: 8.5 mg/dL — ABNORMAL LOW (ref 8.9–10.3)
Chloride: 99 mmol/L (ref 98–111)
Creatinine, Ser: 8.04 mg/dL — ABNORMAL HIGH (ref 0.61–1.24)
GFR, Estimated: 6 mL/min — ABNORMAL LOW (ref >60)
Glucose, Bld: 101 mg/dL — ABNORMAL HIGH (ref 70–99)
Phosphorus: 7.2 mg/dL — ABNORMAL HIGH (ref 2.5–4.6)
Potassium: 4.4 mmol/L (ref 3.5–5.1)
Sodium: 135 mmol/L (ref 135–145)

## 2023-08-08 LAB — HEPATITIS B SURFACE ANTIGEN: Hepatitis B Surface Ag: NONREACTIVE

## 2023-08-08 MED ORDER — HEPARIN SODIUM (PORCINE) 1000 UNIT/ML DIALYSIS: 1000.0000 [IU] | INTRAMUSCULAR | Status: DC | PRN

## 2023-08-08 MED ORDER — HEPARIN SODIUM (PORCINE) 1000 UNIT/ML IJ SOLN
INTRAMUSCULAR | Status: AC
  Filled 2023-08-08: qty 10

## 2023-08-08 MED ORDER — EPOETIN ALFA-EPBX 10000 UNIT/ML IJ SOLN
INTRAMUSCULAR | Status: AC
  Filled 2023-08-08: qty 1

## 2023-08-08 MED ORDER — EPOETIN ALFA-EPBX 10000 UNIT/ML IJ SOLN
10000.0000 [IU] | INTRAMUSCULAR | Status: DC
Start: 2023-08-08 — End: 2023-08-08

## 2023-08-08 MED ORDER — ANTICOAGULANT SODIUM CITRATE 4% (200MG/5ML) IV SOLN
5.0000 mL | Status: DC | PRN
  Filled 2023-08-08: qty 5

## 2023-08-08 MED ORDER — ALTEPLASE 2 MG IJ SOLR: 2.0000 mg | Freq: Once | INTRAMUSCULAR | Status: DC | PRN

## 2023-08-08 MED ORDER — NEPRO/CARBSTEADY PO LIQD: 237.0000 mL | ORAL | Status: DC | PRN

## 2023-08-08 MED ORDER — EPOETIN ALFA-EPBX 10000 UNIT/ML IJ SOLN
10000.0000 [IU] | INTRAMUSCULAR | Status: DC
Start: 2023-08-08 — End: 2023-08-09
  Administered 2023-08-08: 10000 [IU] via INTRAVENOUS
  Filled 2023-08-08 (×2): qty 1

## 2023-08-08 NOTE — Progress Notes (Signed)
 The patient has refused his lab draw for this morning. He would like the labs to be drawn during dialysis.

## 2023-08-08 NOTE — Consult Note (Addendum)
 Advanced Heart Failure Team Consult Note   Primary Physician: Reubin Milan, MD Cardiologist:  Verne Carrow, MD  Reason for Consultation: acute on chronic systolic hF  HPI:    Ronald [name] is seen today for evaluation of acute on chronic systolic heart failure at the request of Dr. Allena Katz, hospital medicine.   Ronald [name] is a 78 y.o. male with a PMH of  AAA, CAD, DM, hyperlipidemia, HTN, CKD, COPD, GERD, gout, pneumonia, atrial fibrillation, previous tobacco use and chronic heart failure.   LHC 2/19 showed significant native CAD with total occlusion of LAD after the takeoff of first diagonal vessel with total occlusion of proximal left circumflex coronary artery and total occlusion of proximal RCA with antegrade bridging collaterals.  Patent LIMA to LAD.  Patent Y graft from surgery which supplies diagonal and distal circumflex marginal vessels.    Was in the ED 10/2022 after a fall getting off his lawnmower.  CT scan negative for acute traumatic injury.  Was in the ED again at 10/2022 due to headache on the right side.  Hit his head roughly a week prior on the left side.  CT scan reassuring with no acute abnormalities or skull fracture.  Patient had labs which were reassuring.  Admitted 2/24 due to midsternal chest pain.  Heparin drip and medical management.  Admitted 12/23 due to history of heart failure.  Diuresis with Lasix gtt.  Cardiology and nephrology consulted.   Now on iHD. He was trialed on peritoneal dialysis but unfortunately his catheter did not function well.  He also has a left upper arm fistula in place that is still not working.   Echo 10/24 EF 20-25%   Numerous ED visits recently for tongue/lip laceration. Initially sutured 07/22/23. Presented to the ED again 2/2 and 3/2 with ongoing bleeding that would resolve in the ED so we would be discharged. Patient does not remember being syncopal in HD but it was reported on admission that he was syncopal after HD.  Once in the ED Hgb found to be 7.2. He was transfused 1 uPRBC and given back IVF. Bradycardic in the ED so metoprolol and digoxin held. SBP 90s on admission. Lactic acid 2.5. CXR with small L pleural effusion. Admitted with hypovolemic shock 2/2 iHD and acute blood loss anemia.   Currently in iHD. Unable to tell me his home meds, states his wife helps him with them.    Home Medications Prior to Admission medications   Medication Sig Start Date End Date Taking? Authorizing Provider  acetaminophen (TYLENOL) 650 MG CR tablet Take 1,300 mg by mouth at bedtime.   Yes [provider]  allopurinol (ZYLOPRIM) 100 MG tablet TAKE 1 TABLET BY MOUTH EVERYDAY AT BEDTIME 07/08/23  Yes Reubin Milan, MD  amiodarone (PACERONE) 200 MG tablet TAKE 1 TABLET BY MOUTH EVERY DAY 04/22/23  Yes Kathleene Hazel, MD  atorvastatin (LIPITOR) 40 MG tablet TAKE 1 TABLET BY MOUTH EVERYDAY AT BEDTIME 07/15/23  Yes Reubin Milan, MD  Cholecalciferol (VITAMIN D-3) 125 MCG (5000 UT) TABS Take 2,000 Units by mouth daily.    Yes [provider]  diphenhydrAMINE (BENADRYL) 50 MG capsule Take 50 mg by mouth at bedtime.   Yes [provider]  ELIQUIS 5 MG TABS tablet TAKE 1 TABLET BY MOUTH TWICE A DAY 05/23/23  Yes Kathleene Hazel, MD  furosemide (LASIX) 40 MG tablet Take 40 mg by mouth daily as needed for edema. Taking 1  1/2 tablets (60mg  total) Pt takes on Tue. Thur. Sat.and Sunday's. 10/12/22  Yes [provider]  levothyroxine (SYNTHROID) 75 MCG tablet TAKE 1 TABLET BY MOUTH EVERY DAY BEFORE BREAKFAST 07/19/23  Yes Reubin Milan, MD  metoprolol succinate (TOPROL XL) 100 MG 24 hr tablet Take 1 tablet (100 mg total) by mouth daily. Take with or immediately following a meal. 05/03/23 05/02/24 Yes Romie Minus, MD  Multiple Vitamin (MULTIVITAMIN PO) Take 1 tablet by mouth daily.   Yes [provider]  nitroGLYCERIN (NITROSTAT) 0.4 MG SL tablet Place 0.4 mg  under the tongue every 5 (five) minutes as needed for chest pain (Up to 3 times).   Yes [provider]  pantoprazole (PROTONIX) 40 MG tablet Take 1 tablet (40 mg total) by mouth 2 (two) times daily. Home med. 07/23/23  Yes Reubin Milan, MD  senna (SENOKOT) 8.6 MG tablet Take 1 tablet by mouth as needed for constipation.   Yes [provider]    Past Medical History: Past Medical History:  Diagnosis Date   AAA (abdominal aortic aneurysm) (HCC)    a. 3cm by [place] 2015; b. 07/2023 Ao U/S: Abd Ao 2.6cm.   Arthritis    "hips; back" (12/13/2014)   CAD (coronary artery disease)    a. 1996 s/p CABGx4: LIMA-LAD, VG-Cx, VG-RCA, VG-diag; b. 2007 s/p redo CABGx2: VG-OM, VG-RCA due to VG disease; c. NSTEMI 11/2014 s/p DES to SVG-OM from the Y graft; d. 09/2015 PCI: distal body of SVG-Diag; e. 07/2017 Cath: LAD 100p, LCX 100p, RCA 95ost/100p, VG->dRCA 10p ISR, 30p/m/d, Y graft->OM3 (ok) & D2 40m, patent distal stent, LIMA->LAD ok, VG->OM2 100, VG->RPAV 100-->Med rx.   Chronic combined systolic and diastolic CHF (congestive heart failure) (HCC)    a. remote EF 40-45% in 2006. b. Normal EF 2014. c. Echo 07/2016 EF 45-50%, grade 1 DD. d. Echo 2020 30% to 35%; e. 03/2023 Echo: EF 20-25%, glob HK, inf wall best preserved. Mildly reduced RV fxn, RVSP 39.76mmHg. Mod dil LA. Mod MR/TR. AoV sclerosis.   Chronic lower back pain    CKD (chronic kidney disease), stage IV (HCC)    COPD (chronic obstructive pulmonary disease) (HCC)    Deafness in left ear    Degenerative disc disease, lumbar    Diabetes mellitus, type 2 (HCC) 10/04/2014   Microalbumin 05/11/2012-100. Foot exam/monofilament 05/11/2012-normal.   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   Emphysema    Esophageal stricture 07/02/1998   EGD   Genital candidiasis in male 10/25/2012   GERD (gastroesophageal reflux disease)    History of gout    "last flareup was in 2007" (12/13/2014)   History of hiatal hernia    Hyperlipidemia     Hypertension    Ischemic cardiomyopathy 2006   Echo 2020: EF 30-35%, diffuse hypokinesis   Ischemic cardiomyopathy    Myocardial infarction (HCC) 12/13/2014   NSTEMI (non-ST elevated myocardial infarction) (HCC) 12/13/2014   PAF (paroxysmal atrial fibrillation) (HCC)    a. CHA2DS2VASc = 6-->eliquis.  On Amio.   PVC's (premature ventricular contractions)    Renal artery stenosis (HCC)    a. noted on CT 2008.   Type II diabetes mellitus (HCC)    Diet control    Unstable angina (HCC) 07/27/2017   Walking pneumonia 1990's   Wears dentures    full upper    Past Surgical History: Past Surgical History:  Procedure Laterality Date   CARDIAC CATHETERIZATION  "several"   CARDIAC CATHETERIZATION N/A 12/13/2014   Procedure:  Left Heart Cath and Coronary Angiography;  Surgeon: Corky Crafts, MD;  Location: [name] INVASIVE CV LAB;  Service: Cardiovascular;  Laterality: N/A;   CARDIAC CATHETERIZATION  12/13/2014   Procedure: Coronary Stent Intervention;  Surgeon: Corky Crafts, MD;  Location: [name] INVASIVE CV LAB;  Service: Cardiovascular;;   CARDIAC CATHETERIZATION N/A 10/07/2015   Procedure: Left Heart Cath and Cors/Grafts Angiography;  Surgeon: Kathleene Hazel, MD;  Location: [facility] INVASIVE CV LAB;  Service: Cardiovascular;  Laterality: N/A;   CARDIAC CATHETERIZATION N/A 10/07/2015   Procedure: Coronary Stent Intervention;  Surgeon: Kathleene Hazel, MD;  Location: [facility] INVASIVE CV LAB;  Service: Cardiovascular;  Laterality: N/A;   CARDIOVERSION N/A 05/25/2022   Procedure: CARDIOVERSION;  Surgeon: Thomasene Ripple, DO;  Location: [facility] ENDOSCOPY;  Service: Cardiovascular;  Laterality: N/A;   CATARACT EXTRACTION W/PHACO Left 10/21/2021   Procedure: CATARACT EXTRACTION PHACO AND INTRAOCULAR LENS PLACEMENT (IOC) LEFT 3.94 00:33.7;  Surgeon: Nevada Crane, MD;  Location: [facility];  Service: Ophthalmology;  Laterality: Left;   CATARACT EXTRACTION W/PHACO Right 11/09/2021    Procedure: CATARACT EXTRACTION PHACO AND INTRAOCULAR LENS PLACEMENT (IOC) RIGHT;  Surgeon: Nevada Crane, MD;  Location: [facility];  Service: Ophthalmology;  Laterality: Right;  3.59 0:29.4   CORONARY ANGIOPLASTY  "several"   CORONARY ANGIOPLASTY WITH STENT PLACEMENT  2005; 12/13/2014   "2; 1"   CORONARY ARTERY BYPASS GRAFT  05/31/1994   CABG X5   CORONARY ARTERY BYPASS GRAFT  07/29/2005   CABG X3   DIALYSIS/PERMA CATHETER INSERTION N/A 09/10/2022   Procedure: DIALYSIS/PERMA CATHETER INSERTION;  Surgeon: Renford Dills, MD;  Location: [facility] INVASIVE CV LAB;  Service: Cardiovascular;  Laterality: N/A;   ESOPHAGOGASTRODUODENOSCOPY (EGD) WITH ESOPHAGEAL DILATION  05/31/1998   GREEN LIGHT LASER TURP (TRANSURETHRAL RESECTION OF PROSTATE  01/30/1999   "not cancerous"   HERNIA REPAIR     LAPAROSCOPIC CHOLECYSTECTOMY     LEFT HEART CATH AND CORS/GRAFTS ANGIOGRAPHY N/A 07/28/2017   Procedure: LEFT HEART CATH AND CORS/GRAFTS ANGIOGRAPHY;  Surgeon: Lennette Bihari, MD;  Location: [facility] INVASIVE CV LAB;  Service: Cardiovascular;  Laterality: N/A;   LUNG SURGERY  05/31/1994   "S/P CABG, had to put staple in lung after it had collapsed"   MINOR REMOVAL OF PERITONEAL DIALYSIS CATHETER  10/2022   UMBILICAL HERNIA REPAIR     w/chole    Family History: Family History  Problem Relation Age of Onset   Heart attack Mother        MI   Stroke Mother    Heart disease Mother    Hypertension Mother    Hyperlipidemia Mother    Asthma Mother    Heart disease Father    Rheumatic fever Father    Colon cancer Neg Hx     Social History: Social History   Socioeconomic History   Marital status: Married    Spouse name: Winfrey Chillemi   Number of children: 2   Years of education: Not on file   Highest education level: 8th grade  Occupational History   Occupation: Retired  Tobacco Use   Smoking status: Former    Current packs/day: 0.00    Average packs/day: 3.0 packs/day for 20.0 years  (60.0 ttl pk-yrs)    Types: Cigarettes    Start date: 07/25/1966    Quit date: 07/25/1986    Years since quitting: 37.0   Smokeless tobacco: Never  Vaping Use   Vaping status: Never Used  Substance and Sexual Activity  Alcohol use: No    Alcohol/week: 0.0 standard drinks of alcohol   Drug use: No   Sexual activity: Not Currently  Other Topics Concern   Not on file  Social History Narrative   Did auto salvage work.   Lives at home with his wife.  Independent at baseline.   Social Drivers of Corporate investment banker Strain: Low Risk  (06/23/2023)   Overall Financial Resource Strain (CARDIA)    Difficulty of Paying Living Expenses: Not hard at all  Food Insecurity: No Food Insecurity (08/08/2023)   Hunger Vital Sign    Worried About Running Out of Food in the Last Year: Never true    Ran Out of Food in the Last Year: Never true  Transportation Needs: No Transportation Needs (08/08/2023)   PRAPARE - Administrator, Civil Service (Medical): No    Lack of Transportation (Non-Medical): No  Physical Activity: Inactive (06/23/2023)   Exercise Vital Sign    Days of Exercise per Week: 0 days    Minutes of Exercise per Session: 0 min  Stress: No Stress Concern Present (06/23/2023)   [name] of Occupational Health - Occupational Stress Questionnaire    Feeling of Stress : Only a little  Social Connections: Moderately Isolated (08/08/2023)   Social Connection and Isolation Panel [NHANES]    Frequency of Communication with Friends and Family: Twice a week    Frequency of Social Gatherings with Friends and Family: Three times a week    Attends Religious Services: Never    Active Member of Clubs or Organizations: No    Attends Banker Meetings: Never    Marital Status: Married    Allergies:  Allergies  Allergen Reactions   Predicort [Prednisolone] Other (See Comments)    Stomach pain   Ciprofloxacin Other (See Comments)    GI upset    Hydrochlorothiazide Other (See Comments)    Dehydration   Hydrocodone Nausea Only and Other (See Comments)    Stomach upset    Hydrocodone-Acetaminophen Nausea Only    Stomach upset   Sulfa Antibiotics Other (See Comments)    Cannot recall   Penicillins Hives, Rash and Other (See Comments)    Has patient had a PCN reaction causing immediate rash, facial/tongue/throat swelling, SOB or lightheadedness with hypotension: YES  Has patient had a PCN reaction causing severe rash involving mucus membranes or skin necrosis: NO  Has patient had a PCN reaction that required hospitalization NO  Has patient had a PCN reaction occurring within the last 10 years:NO  If all of the above answers are "NO", then may proceed with Cephalosporin use.  Has patient had a PCN reaction causing immediate rash, facial/tongue/throat swelling, SOB or lightheadedness with hypotension: YES Has patient had a PCN reaction causing severe rash involving mucus membranes or skin necrosis: NO Has patient had a PCN reaction that required hospitalization NO Has patient had a PCN reaction occurring within the last 10 years:NO If all of the above answers are "NO", then may proceed with Cephalosporin use.    Objective:    Vital Signs:   Temp:  [97.5 F (36.4 C)-98.1 F (36.7 C)] 97.5 F (36.4 C) (03/10 0812) Pulse Rate:  [44-95] 46 (03/10 0930) Resp:  [12-21] 15 (03/10 0930) BP: (100-123)/(41-94) 110/42 (03/10 0930) SpO2:  [95 %-100 %] 98 % (03/10 0930) Weight:  [92.5 kg-92.7 kg] 92.7 kg (03/10 0812) Last BM Date : 08/07/23 (stated by patient)  Weight change: [employer]  08/05/23 1820 08/08/23 0500 08/08/23 0812  Weight: 92.1 kg 92.5 kg 92.7 kg    Intake/Output:   Intake/Output Summary (Last 24 hours) at 08/08/2023 0942 Last data filed at 08/08/2023 0400 Gross per 24 hour  Intake 480 ml  Output --  Net 480 ml    Physical Exam    General:  elderly/frail appearing.  No respiratory difficulty. Generalized  ecchymosis.  HEENT: normal Neck: supple. JVD difficult to see.  Cor: PMI nondisplaced. Regular rate & rhythm. No rubs, gallops or murmurs. RU chest accessed for iHD Lungs: clear, diminished bases Extremities: no cyanosis, clubbing, rash, trace BLE edema. Fistula +bruit/thrill LUE  Neuro: alert & oriented x 3 but forgetful. Affect pleasant.   Telemetry   SB 40s-50s (Personally reviewed)    EKG    Junctional 54 bpm 3/25  Labs  Basic Metabolic Panel: Recent Labs  Lab 08/05/23 1827 08/06/23 0627 08/07/23 0500 08/08/23 0828  NA 136 135 133* 135  K 3.8 4.1 4.4 4.4  CL 100 103 99 99  CO2 23 23 25 22   GLUCOSE 132* 105* 93 101*  BUN 23 29* 44* 58*  CREATININE 3.79* 4.84* 6.65* 8.04*  CALCIUM 7.7* 7.9* 8.3* 8.5*  MG  --  2.0 2.0  --   PHOS  --  5.2* 6.3* 7.2*   Liver Function Tests: Recent Labs  Lab 08/05/23 1827 08/08/23 0828  AST 39  --   ALT 26  --   ALKPHOS 121  --   BILITOT 1.0  --   PROT 5.7*  --   ALBUMIN 2.9* 2.8*   No results for input(s): "LIPASE", "AMYLASE" in the last 168 hours. No results for input(s): "AMMONIA" in the last 168 hours.  CBC: Recent Labs  Lab 08/05/23 1827 08/06/23 0004 08/06/23 0627 08/06/23 1202 08/06/23 1725 08/06/23 2002 08/07/23 0500 08/07/23 1335 08/08/23 0025  WBC 5.7  --  6.8  --   --   --  7.5  --   --   NEUTROABS 4.1  --   --   --   --   --   --   --   --   HGB 7.2*   < > 8.3*   < > 8.7* 8.3* 8.4* 8.4* 7.9*  HCT 22.6*   < > 25.8*   < > 26.8* 25.8* 25.6* 25.6* 24.0*  MCV 104.6*  --  100.4*  --   --   --  97.7  --   --   PLT 117*  --  107*  --   --   --  122*  --   --    < > = values in this interval not displayed.   Cardiac Enzymes: No results for input(s): "CKTOTAL", "CKMB", "CKMBINDEX", "TROPONINI" in the last 168 hours.  BNP: BNP (last 3 results) Recent Labs    09/13/22 1429  BNP 1,700.3*   ProBNP (last 3 results) No results for input(s): "PROBNP" in the last 8760 hours.  CBG: Recent Labs  Lab  08/06/23 1459 08/06/23 2010 08/06/23 2250 08/07/23 0512 08/07/23 1008  GLUCAP 84 120* 127* 83 85   Coagulation Studies: Recent Labs    08/05/23 1848  LABPROT 18.7*  INR 1.5*   Imaging   No results found.  Medications:   Current Medications:  allopurinol  100 mg Oral QHS   atorvastatin  40 mg Oral Daily   Chlorhexidine Gluconate Cloth  6 each Topical Q0600   cholecalciferol  2,000 Units Oral Daily   levothyroxine  75 mcg Oral Q0600   midodrine  5 mg Oral TID WC   multivitamin with minerals  1 tablet Oral Daily   pantoprazole  40 mg Oral BID    Infusions:  anticoagulant sodium citrate     Patient Profile   Ronald [name] is a 78 y.o. male with a PMH of  AAA, CAD, DM, hyperlipidemia, HTN, CKD, COPD, GERD, gout, pneumonia, atrial fibrillation, previous tobacco use and chronic heart failure. AHF team to see for acute on chronic systolic HF.   Assessment/Plan  Acute on chronic heart failure with reduced EF: Likely secondary to known ischemia, the patient with significant neuropathy, spinal stenosis, renal disease.  - NYHA IIIb on admission.  - Echo EF 20-25%  - Volume management per nephrology/iHD. Seems mildly elevated. Place ted hose.  - Currently off lasix with iHD, previously on lasix on non-HD days.  - Aldosterone antagonist: Not a candidate - Beta-blocker: On hold with bradycardia - Digoxin: Not a candidate - Hydralazine/Nitrates: Not a candidate - SGLT2i: Not a candidate - Plan for OP discussion about ICD at next visit giving multiple advanced medical comorbidities - PYP scan - Given ESRD reasonable to avoid AL amyloid workup - Not a candidate for advanced therapies.   Bradycardia - On admission HR in 40s-50s - Holding BB and amio at this time - HR 40s while in iHD  Near syncope - After iHD, suspect 2/2 blood loss - Continue midodrine  Acute on chronic blood loss anemia Tongue laceration - Initial Hgb 7.2. S/p 1uPRBC - Holding Eliquis - Hgb 7.9  today   HTN: BP controlled today    DM- - A1c 4.7 3/25 - per primary   PAF: cardioverted 04/2022, currently in SB.  - Now off amiodarone - Off Eliquis with nonhealing lip wound   ESRD: right IJ dialysis catheter for access, fistula is not currently working. - Continue volume removal with iHD   Length of Stay: 3  Alen Bleacher, NP  08/08/2023, 9:42 AM  Advanced Heart Failure Team Pager [phone] (M-F; 7a - 5p)  Please contact [facility] for night-coverage after hours (4p -7a ) and weekends on [email or web address]   Patient seen and examined with the above-signed Advanced Practice Provider and/or Housestaff. I personally reviewed laboratory data, imaging studies and relevant notes. I independently examined the patient and formulated the important aspects of the plan. I have edited the note to reflect any of my changes or salient points. I have personally discussed the plan with the patient and/or family.  78 y/o with ESRD, DM2, PAF and severe HF due to iCM EF 20-25%   Admitted with syncopal episode in setting of ongoing bleeding from lip laceration which has subsequently been repaired. Now improved with volume and blood resuscitation.   BP now improved on midodrine 5 tid. Tolerated HD well with 1L off today. Denies CP or SOB. No further bleeding from his lip.   General:  Lying in bed on HD. No resp difficulty HEENT: normal + lip sutures Neck: supple. JVP 7  Carotids 2+ bilat; no bruits. No lymphadenopathy or thryomegaly appreciated. +HD cath Cor: PMI nondisplaced. Regular brady Lungs: clear Abdomen: soft, nontender, nondistended. No hepatosplenomegaly. No bruits or masses. Good bowel sounds. Extremities: no cyanosis, clubbing, rash, tr-1+ edema Neuro: alert & orientedx3, cranial nerves grossly intact. moves all 4 extremities w/o difficulty. Affect pleasant   Stable from HF standpoint. Can resume lasix on non-HD days per Nephrology. Continue to hold b-blocker and amio for now with  bradycardia.   Can restart Eliquis at 2.5 bid on d/c.   Can f/u as outpatient for PYP.   Ok for d/c from HF standpoint. We will arrange HF f/u.   AHF team will sign off.   Arvilla Meres, MD  4:15 PM

## 2023-08-08 NOTE — Plan of Care (Signed)

## 2023-08-08 NOTE — Progress Notes (Addendum)
 Hemodialysis Note:  Received patient in bed to unit. Alert and oriented. Informed consent singed and in chart.  Treatment initiated: 0839 Treatment completed: 1247  Access used: Right subclavian catheter Access issues: None  Patient tolerated well. Transported back to room, alert without acute distress. Report given to patient's RN.  Total UF removed: 1 Liter Medications given: Retacrit 91478 units IV  Post HD weight: 91.7 Kg  [facility]

## 2023-08-08 NOTE — Evaluation (Signed)
 Physical Therapy Evaluation Patient Details Name: Ronald [name] [facility] MRN: [id number] DOB: Sep 04, 1945 Today's Date: 08/08/2023  History of Present Illness  presented to ER secondary to dizziness, near syncope; admitted for management of ABLA secondary to bleeding from uncontrolled lip laceration.  Clinical Impression  Patient resting in bed upon arrival to room; supportive wife present at bedside. Alert and oriented, follows commands and agreeable to participation with treatment session.  Denies pain, but does endorse generalized fatigue post-dialysis.  Currently requiring cga/close sup for bed mobility; sit/stand, standing balance and bed/chair transfer with RW, cga/min assist for safety.  Demonstrates cuing for hand placement, walker management.  Broad BOS with choppy stepping pattern, but no overt buckling or LOB. Patient declined additional gait efforts at this time due to generalized fatigue; will continue to assess/progress gait distance next session as appropriate. Vitals stable and WFL with position change; no signs/symptoms of orthostasis appreciated. Would benefit from skilled PT to address above deficits and promote optimal return to PLOF.; recommend post-acute PT follow up as indicated by interdisciplinary care team.          If plan is discharge home, recommend the following: A little help with walking and/or transfers;A little help with bathing/dressing/bathroom   Can travel by private vehicle        Equipment Recommendations Rolling walker (2 wheels)  Recommendations for Other Services       Functional Status Assessment Patient has had a recent decline in their functional status and demonstrates the ability to make significant improvements in function in a reasonable and predictable amount of time.     Precautions / Restrictions Precautions Precautions: Fall Restrictions Weight Bearing Restrictions Per Provider Order: No      Mobility  Bed Mobility Overal bed mobility:  Needs Assistance Bed Mobility: Supine to Sit     Supine to sit: Supervision     General bed mobility comments: increased time/effort (endorses sleeping in recliner at baseline)    Transfers Overall transfer level: Needs assistance Equipment used: Rolling walker (2 wheels) Transfers: Sit to/from Stand, Bed to chair/wheelchair/BSC Sit to Stand: Min assist Stand pivot transfers: Min assist         General transfer comment: cuing for hand placement, walker management.  Broad BOS with choppy stepping pattern, but no overt buckling or LOB.    Ambulation/Gait               General Gait Details: patient declined due to fatigue post-dialysis  Stairs            Wheelchair Mobility     Tilt Bed    Modified Rankin (Stroke Patients Only)       Balance Overall balance assessment: Needs assistance Sitting-balance support: No upper extremity supported, Feet supported Sitting balance-Leahy Scale: Good     Standing balance support: Bilateral upper extremity supported Standing balance-Leahy Scale: Fair                               Pertinent Vitals/Pain Pain Assessment Pain Assessment: No/denies pain    Home Living Family/patient expects to be discharged to:: Private residence Living Arrangements: Spouse/significant other Available Help at Discharge: Family;Available 24 hours/day   Home Access: Stairs to enter Entrance Stairs-Rails: Right;Left;Can reach both Entrance Stairs-Number of Steps: 5   Home Layout: One level        Prior Function Prior Level of Function : Independent/Modified Independent;Driving  Mobility Comments: Mod indep without assist device for ADLs, household and limited community mobilization; denies fall history within previous six months ADLs Comments: Wife assists as needed for iADLs, lower body dressing     Extremity/Trunk Assessment   Upper Extremity Assessment Upper Extremity Assessment: Overall WFL  for tasks assessed    Lower Extremity Assessment Lower Extremity Assessment: Overall WFL for tasks assessed (grossly 4-/5 throughout)       Communication   Communication Factors Affecting Communication: Hearing impaired    Cognition Arousal: Alert Behavior During Therapy: WFL for tasks assessed/performed   PT - Cognitive impairments: No apparent impairments                                 Cueing       General Comments      Exercises     Assessment/Plan    PT Assessment Patient needs continued PT services  PT Problem List Decreased strength;Decreased activity tolerance;Decreased balance;Decreased mobility;Decreased coordination;Decreased knowledge of use of DME;Decreased safety awareness;Decreased knowledge of precautions       PT Treatment Interventions DME instruction;Gait training;Stair training;Functional mobility training;Therapeutic activities;Therapeutic exercise;Balance training;Cognitive remediation;Patient/family education    PT Goals (Current goals can be found in the Care Plan section)  Acute Rehab PT Goals Patient Stated Goal: to return home PT Goal Formulation: With patient/family Time For Goal Achievement: 08/22/23 Potential to Achieve Goals: Good    Frequency Min 2X/week     Co-evaluation               AM-PAC PT "6 Clicks" Mobility  Outcome Measure Help needed turning from your back to your side while in a flat bed without using bedrails?: None Help needed moving from lying on your back to sitting on the side of a flat bed without using bedrails?: None Help needed moving to and from a bed to a chair (including a wheelchair)?: A Little Help needed standing up from a chair using your arms (e.g., wheelchair or bedside chair)?: A Little Help needed to walk in hospital room?: A Little Help needed climbing 3-5 steps with a railing? : A Little 6 Click Score: 20    End of Session Equipment Utilized During Treatment: Gait  belt Activity Tolerance: Patient tolerated treatment well Patient left: in chair;with call bell/phone within reach;with chair alarm set Nurse Communication: Mobility status PT Visit Diagnosis: Muscle weakness (generalized) (M62.81);Difficulty in walking, not elsewhere classified (R26.2)    Time: 1610-9604 PT Time Calculation (min) (ACUTE ONLY): 18 min   Charges:   PT Evaluation $PT Eval Moderate Complexity: 1 Mod   PT General Charges $$ ACUTE PT VISIT: 1 Visit        Oyinkansola Truax H. Manson Passey, PT, DPT, NCS 08/08/23, 3:25 PM [phone]

## 2023-08-08 NOTE — Progress Notes (Signed)
 OT Cancellation Note  Patient Details Name: Ronald [name] MRN: [id number] DOB: February 27, 1946   Cancelled Treatment:    Reason Eval/Treat Not Completed: Patient at procedure or test/ unavailable-order received, chart reviewed, but pt off the floor for dialysis. Will re-attempt at later date/time as pt available/appropriate.  [name] Ronald [name] 08/08/2023, 1:04 PM

## 2023-08-08 NOTE — Progress Notes (Signed)
 [facility]  - [place] at [facility]   PATIENT NAME: Ronald [name]    MR#:  [id number]  DATE OF BIRTH:  04-08-1946  SUBJECTIVE:  wife at bedside. Patient seen after dialysis. Overall feeling better. Stitches from right oral mucosa removed at bedside by RN and me. No bleeding noted. Tolerated dialysis well  VITALS:  Blood pressure (!) 103/37, pulse (!) 52, temperature (!) 97.5 F (36.4 C), temperature source Oral, resp. rate 18, height 5\' 9"  (1.753 m), weight 91.7 kg, SpO2 99%.  PHYSICAL EXAMINATION:   GENERAL:  78 y.o.-year-old patient with no acute distress. Morbidly obese oral mucosa normal. Suture removed from  the right buccal cavity. No bleeding noted. LUNGS: Normal breath sounds bilaterally CARDIOVASCULAR: S1, S2 normal. No murmur   ABDOMEN: Soft, nontender, nondistended. Bowel sounds present.  EXTREMITIES: No  edema b/l.   HD access+ NEUROLOGIC: nonfocal  patient is alert and awake   LABORATORY PANEL:  CBC Recent Labs  Lab 08/07/23 0500 08/07/23 1335 08/08/23 0025  WBC 7.5  --   --   HGB 8.4*   < > 7.9*  HCT 25.6*   < > 24.0*  PLT 122*  --   --    < > = values in this interval not displayed.    Chemistries  Recent Labs  Lab 08/05/23 1827 08/06/23 0627 08/07/23 0500 08/08/23 0828  NA 136   < > 133* 135  K 3.8   < > 4.4 4.4  CL 100   < > 99 99  CO2 23   < > 25 22  GLUCOSE 132*   < > 93 101*  BUN 23   < > 44* 58*  CREATININE 3.79*   < > 6.65* 8.04*  CALCIUM 7.7*   < > 8.3* 8.5*  MG  --    < > 2.0  --   AST 39  --   --   --   ALT 26  --   --   --   ALKPHOS 121  --   --   --   BILITOT 1.0  --   --   --    < > = values in this interval not displayed.   Cardiac Enzymes No results for input(s): "TROPONINI" in the last 168 hours. RADIOLOGY:  No results found.   Assessment and Plan 78 y.o male with significant PMH of CAD s/p CABG in 1996 (LIMA-LAD, SVG-circumflex, SVG-RCA, SVG-diagonal) with redo bypass in 2007 (SVG-OM, SVG-RCA),  ischemic cardiomyopathy, ESRD on HD, PVD DM 2, HTN, HLD, Gout, AAA, renal artery stenosis, COPD, GERD, Hypothyroidism, atrial fibrillation and PVCs who presented to the ED with chief complaints of dizziness with near syncopal episodes.   Patient came in after he had accidentally bitten his lip while he was asleep and had three emergency room visits on different occasions due to bleeding from the oral mucosa. Suture was placed last Sunday from the area there was a bit on the right buccal cavity. Eliquis has been hold for last several days.  Patient came in with hemoglobin of 7.2 received one unit blood transfusion hemoglobin has been stable since  Acute on chronic blood loss anemia due to laceration and patient being on eliquis oral mucosa laceration due to tongue bite accidental -- patient came in with hemoglobin of 7.2-- one unit blood transfusion--- hemoglobin has been above 8.0. No bleeding noted -- hemoglobin today 7.9 -- per patient's wife suture on the right oral mucosa/buccal cavity was placed in  the ER last Sunday. Suture  removed at bedside today. Currently no active bleeding  Syncope suspected due to blood loss anemia/hypotension end-stage renal disease on hemodialysis -- patient currently appears hemodynamically stable how were blood pressure on the softer side -- nephrology consultation placed for in-house dialysis --midodrine tid  History of a fib with slow ventricular response chronic anticoagulation -- patient at home on amiodarone, beta-blocker-- currently on hold -- patient will need to follow-up with the EP as outpatient. -- eliquis--- on hold -- cardiology consultation with Dr. Mariah Milling  End-stage renal disease on hemodialysis -- nephrology consultation for in-house dialysis  Hypothyroidism -- resume Synthroid  Type II diabetes, well-controlled -- most recent A1c 4.7 -- not on any meds at home -- discontinue sliding scale. Will do daily CBG  COPD/obstructive sleep  apnea -- stable  PT OT recommends home health discussed discharge cardiac meds with cardiology   Procedures: Family communication : wife at bedside Consults : [facility] cardiology, nephrology CODE STATUS: full DVT Prophylaxis : eliquis currently on hold Level of care: Progressive Status is: Inpatient Remains inpatient appropriate because: monitor from cardiac standpoint for another day. Hopefully can discharge tomorrow.   TOTAL TIME TAKING CARE OF THIS PATIENT: 50 minutes.  >50% time spent on counselling and coordination of care  Note: This dictation was prepared with Dragon dictation along with smaller phrase technology. Any transcriptional errors that result from this process are unintentional.  Enedina Finner M.D    [facility]   CC: Primary care physician; Reubin Milan, MD

## 2023-08-08 NOTE — Progress Notes (Addendum)
 [facility]  ROUNDING NOTE   Subjective:   Ronald [name]  is a 78 year old male with past medical conditions including CAD, type 2 diabetes, emphysema, GERD, hyperlipidemia, hypertension, AAA, and end-stage renal disease on hemodialysis.  Patient presents to the emergency department with weakness and hypotension. He has been admitted for ESRD (end stage renal disease) on dialysis (HCC) [N18.6, Z99.2] Near syncope [R55] Hypotension, unspecified hypotension type [I95.9] Anemia, unspecified type [D64.9] Acute blood loss anemia (ABLA) [D62]   Patient is known to our practice and receives outpatient dialysis treatments at [facility]. Last treatment completed on Saturday. 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 He doesn't recall if he bite his lip or if the stitches popped open. He states he has continued to bleed for a few days. Reports weakness. BP low on EMS arrival.   Hgb 7.2 on admission. Did receive blood transfusion. Hgb has been stable.   We have been consulted to manage dialysis needs.    Objective:  Vital signs in last 24 hours:  Temp:  [97.5 F (36.4 C)-98.1 F (36.7 C)] 97.5 F (36.4 C) (03/10 0812) Pulse Rate:  [44-95] 46 (03/10 1130) Resp:  [12-20] 13 (03/10 1130) BP: (100-118)/(37-76) 116/41 (03/10 1130) SpO2:  [95 %-100 %] 98 % (03/10 1130) Weight:  [92.5 kg-92.7 kg] 92.7 kg (03/10 0812)  Weight change:  Filed Weights   08/05/23 1820 08/08/23 0500 08/08/23 0812  Weight: 92.1 kg 92.5 kg 92.7 kg    Intake/Output: I/O last 3 completed shifts: In: 480 [P.O.:480] Out: -    Intake/Output this shift:  No intake/output data recorded.  Physical Exam: General: NAD  Head: Normocephalic, atraumatic. Moist oral mucosal membranes  Eyes: Anicteric  Lungs:  Clear to auscultation, normal effort  Heart: Regular rate and rhythm  Abdomen:  Soft, nontender  Extremities:  No peripheral edema.  Neurologic:  Nonfocal, moving all four extremities  Skin:  Lip abrasion  Access: Rt internal jugular tunneled access    Basic Metabolic Panel: Recent Labs  Lab 08/05/23 1827 08/06/23 0627 08/07/23 0500 08/08/23 0828  NA 136 135 133* 135  K 3.8 4.1 4.4 4.4  CL 100 103 99 99  CO2 23 23 25 22   GLUCOSE 132* 105* 93 101*  BUN 23 29* 44* 58*  CREATININE 3.79* 4.84* 6.65* 8.04*  CALCIUM 7.7* 7.9* 8.3* 8.5*  MG  --  2.0 2.0  --   PHOS  --  5.2* 6.3* 7.2*    Liver Function Tests: Recent Labs  Lab 08/05/23 1827 08/08/23 0828  AST 39  --   ALT 26  --   ALKPHOS 121  --   BILITOT 1.0  --   PROT 5.7*  --   ALBUMIN 2.9* 2.8*   No results for input(s): "LIPASE", "AMYLASE" in the last 168 hours. No results for input(s): "AMMONIA" in the last 168 hours.  CBC: Recent Labs  Lab 08/05/23 1827 08/06/23 0004 08/06/23 0627 08/06/23 1202 08/06/23 1725 08/06/23 2002 08/07/23 0500 08/07/23 1335 08/08/23 0025  WBC 5.7  --  6.8  --   --   --  7.5  --   --   NEUTROABS 4.1  --   --   --   --   --   --   --   --   HGB 7.2*   < > 8.3*   < > 8.7* 8.3* 8.4* 8.4* 7.9*  HCT 22.6*   < > 25.8*   < >  26.8* 25.8* 25.6* 25.6* 24.0*  MCV 104.6*  --  100.4*  --   --   --  97.7  --   --   PLT 117*  --  107*  --   --   --  122*  --   --    < > = values in this interval not displayed.    Cardiac Enzymes: No results for input(s): "CKTOTAL", "CKMB", "CKMBINDEX", "TROPONINI" in the last 168 hours.  BNP: Invalid input(s): "POCBNP"  CBG: Recent Labs  Lab 08/06/23 1459 08/06/23 2010 08/06/23 2250 08/07/23 0512 08/07/23 1008  GLUCAP 84 120* 127* 83 85    Microbiology: Results for orders placed or performed during the hospital encounter of 08/05/23  Urine Culture     Status: Abnormal   Collection Time: 08/06/23  3:40 PM   Specimen: Urine, Random  Result Value Ref Range Status   Specimen Description   Final    URINE, RANDOM Performed at [facility], [address], [place], [place]  [address]    Special Requests   Final    URINE, CLEAN CATCH Performed at [facility], [address], [place], [place] [address]    Culture MULTIPLE SPECIES PRESENT, SUGGEST RECOLLECTION (A)  Final   Report Status 08/07/2023 FINAL  Final  Culture, blood (Routine X 2) w Reflex to ID Panel     Status: None (Preliminary result)   Collection Time: 08/06/23  4:15 PM   Specimen: BLOOD  Result Value Ref Range Status   Specimen Description BLOOD BLOOD RIGHT ARM  Final   Special Requests   Final    BOTTLES DRAWN AEROBIC AND ANAEROBIC Blood Culture results may not be optimal due to an inadequate volume of blood received in culture bottles   Culture   Final    NO GROWTH 2 DAYS Performed at [facility], [address] [place], [place] [address]    Report Status PENDING  Incomplete  Culture, blood (Routine X 2) w Reflex to ID Panel     Status: None (Preliminary result)   Collection Time: 08/06/23  5:25 PM   Specimen: BLOOD  Result Value Ref Range Status   Specimen Description BLOOD RW  Final   Special Requests   Final    BOTTLES DRAWN AEROBIC AND ANAEROBIC Blood Culture adequate volume   Culture   Final    NO GROWTH 2 DAYS Performed at [facility], [address] [place], [place] [address]    Report Status PENDING  Incomplete    Coagulation Studies: Recent Labs    08/05/23 1848  LABPROT 18.7*  INR 1.5*    Urinalysis: Recent Labs    08/06/23 1540  COLORURINE AMBER*  LABSPEC 1.022  PHURINE 5.0  GLUCOSEU NEGATIVE  HGBUR SMALL*  BILIRUBINUR SMALL*  KETONESUR NEGATIVE  PROTEINUR >=300*  NITRITE NEGATIVE  LEUKOCYTESUR SMALL*      Imaging: No results found.   Medications:    anticoagulant sodium citrate      allopurinol  100 mg Oral QHS   atorvastatin  40 mg Oral Daily   Chlorhexidine Gluconate Cloth  6 each Topical Q0600   cholecalciferol  2,000 Units Oral Daily   levothyroxine  75 mcg Oral Q0600   midodrine  5 mg Oral TID WC    multivitamin with minerals  1 tablet Oral Daily   pantoprazole  40 mg Oral BID   alteplase, anticoagulant sodium citrate, docusate sodium, feeding supplement (NEPRO CARB STEADY), heparin, polyethylene glycol  Assessment/ Plan:  [name] is a 78 y.o.  male  with past medical conditions including CAD, type 2 diabetes, emphysema, GERD, hyperlipidemia, hypertension, AAA, and end-stage renal disease on hemodialysis.  Patient presents to the emergency department with weakness and hypotension. Patient has been admitted for ESRD (end stage renal disease) on dialysis (HCC) [N18.6, Z99.2] Near syncope [R55] Hypotension, unspecified hypotension type [I95.9] Anemia, unspecified type [D64.9] Acute blood loss anemia (ABLA) [D62]   End stage renal disease on hemodialysis, Last treatment completed on Friday. Receiving dialysis today, UF 0.5-1L as tolerated. Next treatment scheduled for Wednesday.   2. Anemia of chronic kidney disease with acute blood loss Lab Results  Component Value Date   HGB 7.9 (L) 08/08/2023    Hgb reduced. Mircera received in outpatient clinic. Patient has received blood transfusion during this admission. Admitting complaint of weakness due to lip wound, eliquis outpatient. Will order EPO 10000 units with dialysis.   3. Secondary Hyperparathyroidism: with outpatient labs: PTH 357, phosphorus 6.3, calcium 8.3 on 07/11/23.   Lab Results  Component Value Date   CALCIUM 8.5 (L) 08/08/2023   PHOS 7.2 (H) 08/08/2023    Will continue to monitor bone minerals during this admission. Patient prescribed sevelamer with meals.   4. Hypotension. Home regimen includes furosemide, amiodarone, metoprolol. All held. Receiving Midodrine. Blood pressure during dialysis 113/40.    LOS: 3 Sela Falk 3/10/202511:34 AM

## 2023-08-08 NOTE — Progress Notes (Signed)
 PT Cancellation Note  Patient Details Name: Ronald [name] MRN: [id number] DOB: 02/10/1946   Cancelled Treatment:    Reason Eval/Treat Not Completed: PT screened, no needs identified, will sign off (Consult received and chart reviewed.  Patient currently off unit for dialysis.  Will re-attempt at later time/date as medically appropriate and available.)   Ronald [name], PT, DPT, NCS 08/08/23, 11:28 AM [phone]

## 2023-08-08 NOTE — Telephone Encounter (Signed)
 Patient Product/process development scientist completed.    The patient is insured through [facility]. Patient has Medicare and is not eligible for a copay card, but may be able to apply for patient assistance or Medicare RX Payment Plan (Patient Must reach out to their plan, if eligible for payment plan), if available.    Ran test claim for Entresto 24-26 mg and the current 30 day co-pay is $47.00.  Ran test claim for Eliquis 2.5 mg and the current 30 day co-pay is $47.00.  This test claim was processed through [facility]- copay amounts may vary at other pharmacies due to pharmacy/plan contracts, or as the patient moves through the different stages of their insurance plan.     Roland Earl, CPHT Pharmacy Technician III Certified Patient Advocate [facility] Patient Advocate Team Direct Number: [phone]  Fax: [phone]

## 2023-08-09 DIAGNOSIS — R55 Syncope and collapse: Secondary | ICD-10-CM | POA: Diagnosis not present

## 2023-08-09 LAB — HEMOGLOBIN AND HEMATOCRIT, BLOOD
HCT: 23.5 % — ABNORMAL LOW (ref 39.0–52.0)
HCT: 24.4 % — ABNORMAL LOW (ref 39.0–52.0)
Hemoglobin: 7.6 g/dL — ABNORMAL LOW (ref 13.0–17.0)
Hemoglobin: 8.1 g/dL — ABNORMAL LOW (ref 13.0–17.0)

## 2023-08-09 LAB — HEPATITIS B SURFACE ANTIBODY, QUANTITATIVE: Hep B S AB Quant (Post): <3.5 m[IU]/mL — ABNORMAL LOW

## 2023-08-09 LAB — GLUCOSE, CAPILLARY: Glucose-Capillary: 94 mg/dL (ref 70–99)

## 2023-08-09 MED ORDER — APIXABAN 2.5 MG PO TABS: 2.5000 mg | ORAL_TABLET | Freq: Two times a day (BID) | ORAL | 1 refills | Status: DC

## 2023-08-09 MED ORDER — MIDODRINE HCL 5 MG PO TABS: 5.0000 mg | ORAL_TABLET | Freq: Three times a day (TID) | ORAL | 1 refills | Status: DC

## 2023-08-09 NOTE — Evaluation (Signed)
 Occupational Therapy Evaluation Patient Details Name: [name] MRN: [id number] DOB: May 01, 1946 Today's Date: 08/09/2023   History of Present Illness   presented to ER secondary to dizziness, near syncope; admitted for management of ABLA secondary to bleeding from uncontrolled lip laceration.     Clinical Impressions Pt was seen for OT evaluation this date. PTA, pt was living at home with his wife who assisted with bathing tasks seated on shower chair d/t dialysis port, otherwise pt IND with ADLs. Able to ambulate in the home with no AD and to the grocery store. Wife reports he is unsteady and needs to use RW, but refuses to.  Pt presents to acute OT demonstrating impaired ADL performance and functional mobility 2/2 weakness and balance deficits. Pt currently requires SUP for bed mobility, CGA/SBA for STS from EOB to RW. CGA/SBA for LB dressing to don socks, underwear and pants. Ambulated ~200 feet using RW with CGA/SBA and no LOB, some cueing for proper RW use as he does not use one at home. Left seated in recliner with chair alarm on and wife present. Pt fatigued after mobility d/t chronic back pain.  Pt would benefit from skilled OT services to address noted impairments and functional limitations (see below for any additional details) in order to maximize safety and independence while minimizing falls risk and caregiver burden. Do anticipate the need for follow up OT services upon acute hospital DC.      If plan is discharge home, recommend the following:   A little help with walking and/or transfers;A little help with bathing/dressing/bathroom;Help with stairs or ramp for entrance;Assistance with cooking/housework     Functional Status Assessment   Patient has had a recent decline in their functional status and demonstrates the ability to make significant improvements in function in a reasonable and predictable amount of time.     Equipment Recommendations   None recommended  by OT     Recommendations for Other Services         Precautions/Restrictions   Restrictions Weight Bearing Restrictions Per Provider Order: No     Mobility Bed Mobility Overal bed mobility: Needs Assistance Bed Mobility: Supine to Sit     Supine to sit: Supervision     General bed mobility comments: increased time/effort (sleeps in recliner at baseline)    Transfers Overall transfer level: Needs assistance Equipment used: Rolling walker (2 wheels) Transfers: Sit to/from Stand Sit to Stand: Contact guard assist, Supervision           General transfer comment: CGA for 1st STS and SBA for 2nd; CGA for mobility ~200 feet with RW      Balance Overall balance assessment: Needs assistance Sitting-balance support: No upper extremity supported, Feet supported Sitting balance-Leahy Scale: Good     Standing balance support: Bilateral upper extremity supported, Reliant on assistive device for balance Standing balance-Leahy Scale: Good Standing balance comment: RW use                           ADL either performed or assessed with clinical judgement   ADL Overall ADL's : Needs assistance/impaired                     Lower Body Dressing: Supervision/safety;Sit to/from stand;Sitting/lateral leans Lower Body Dressing Details (indicate cue type and reason): standing EOB at RW to manage over hips             Functional mobility during ADLs:  Contact guard assist;Supervision/safety;Rolling walker (2 wheels)       Vision         Perception         Praxis         Pertinent Vitals/Pain Pain Assessment Pain Assessment: No/denies pain     Extremity/Trunk Assessment Upper Extremity Assessment Upper Extremity Assessment: Overall WFL for tasks assessed   Lower Extremity Assessment Lower Extremity Assessment: Overall WFL for tasks assessed       Communication Communication Factors Affecting Communication: Hearing impaired    Cognition Arousal: Alert Behavior During Therapy: WFL for tasks assessed/performed Cognition: No apparent impairments                               Following commands: Intact       Cueing  General Comments      VSS   Exercises Other Exercises Other Exercises: Edu on role of OT in acute setting.   Shoulder Instructions      Home Living Family/patient expects to be discharged to:: Private residence Living Arrangements: Spouse/significant other Available Help at Discharge: Family;Available 24 hours/day   Home Access: Stairs to enter Entrance Stairs-Number of Steps: 5 Entrance Stairs-Rails: Right;Left;Can reach both Home Layout: One level     Bathroom Shower/Tub: Producer, television/film/video: Handicapped height     Home Equipment: BSC/3in1;Cane - quad;Shower seat          Prior Functioning/Environment Prior Level of Function : Independent/Modified Independent;Driving             Mobility Comments: Mod indep without assist device for ADLs, household and limited community mobilization; denies fall history within previous six months ADLs Comments: Wife assists as needed for iADLs, lower body dressing    OT Problem List: Impaired balance (sitting and/or standing)   OT Treatment/Interventions: Self-care/ADL training;Therapeutic exercise;Therapeutic activities;Energy conservation;DME and/or AE instruction;Patient/family education;Balance training      OT Goals(Current goals can be found in the care plan section)   Acute Rehab OT Goals Patient Stated Goal: return home OT Goal Formulation: With patient/family Time For Goal Achievement: 08/23/23 Potential to Achieve Goals: Good ADL Goals Pt Will Perform Lower Body Bathing: sit to/from stand;sitting/lateral leans;with supervision Pt Will Perform Lower Body Dressing: with supervision;sit to/from stand;sitting/lateral leans Pt Will Transfer to Toilet: with supervision;regular height  toilet;ambulating Pt Will Perform Toileting - Clothing Manipulation and hygiene: with modified independence;sitting/lateral leans;sit to/from stand Additional ADL Goal #1: Pt will demo/verbalize use of 1 falls prevention strategy 2/2 trials during session to maximize safety and IND.   OT Frequency:  Min 2X/week    Co-evaluation              AM-PAC OT "6 Clicks" Daily Activity     Outcome Measure Help from another person eating meals?: None Help from another person taking care of personal grooming?: None Help from another person toileting, which includes using toliet, bedpan, or urinal?: A Little Help from another person bathing (including washing, rinsing, drying)?: None Help from another person to put on and taking off regular upper body clothing?: None Help from another person to put on and taking off regular lower body clothing?: A Little 6 Click Score: 22   End of Session Equipment Utilized During Treatment: Rolling walker (2 wheels) Nurse Communication: Mobility status  Activity Tolerance: Patient tolerated treatment well Patient left: in chair;with call bell/phone within reach;with chair alarm set;with family/visitor present  OT Visit Diagnosis: Unsteadiness  on feet (R26.81);Other abnormalities of gait and mobility (R26.89)                Time: 1020-1044 OT Time Calculation (min): 24 min Charges:  OT General Charges $OT Visit: 1 Visit OT Evaluation $OT Eval Moderate Complexity: 1 Mod OT Treatments $Therapeutic Activity: 8-22 mins Joyanne Eddinger, OTR/L 08/09/23, 1:44 PM  Kamoni Depree E Thessaly Mccullers 08/09/2023, 1:42 PM

## 2023-08-09 NOTE — Progress Notes (Signed)
 Physical Therapy Treatment Patient Details Name: Ronald [name] [facility] MRN: [id number] DOB: 09-Jan-1946 Today's Date: 08/09/2023   History of Present Illness presented to ER secondary to dizziness, near syncope; admitted for management of ABLA secondary to bleeding from uncontrolled lip laceration.    PT Comments  Pt was sitting in recliner upon arrival. He is A and O x 4 with supportive spouse at bedside. Pt agrees to session and remains cooperative throughout.  Per pt/pt's spouse, pt mobility at baseline is limited due to LBP. Pt does endorse low back pain currently but remains agreeable to session. Pt was easily and safely able to stand and ambulate with use of RW however when session progressed to ambulation with +1 UE support then no UE support, pt very unsteady and is at high risk of falls. Both pt/spouse agree that pt is better off using RW at all times at DC until balance, strength, and safety improves. Overall, pt tolerated session well. Recommend continued skilled PT at DC to maximize pt's abilities, safety, and independence with all ADLs.    If plan is discharge home, recommend the following: A little help with walking and/or transfers;A little help with bathing/dressing/bathroom     Equipment Recommendations  Rolling walker (2 wheels)       Precautions / Restrictions Precautions Precautions: Fall Restrictions Weight Bearing Restrictions Per Provider Order: No     Mobility  Bed Mobility  General bed mobility comments: pt was in recliner pre/post session    Transfers Overall transfer level: Needs assistance Equipment used: Rolling walker (2 wheels) Transfers: Sit to/from Stand Sit to Stand: Supervision   Ambulation/Gait Ambulation/Gait assistance: Min assist, Supervision Gait Distance (Feet): 150 Feet Assistive device: Rolling walker (2 wheels), 1 person hand held assist, None Gait Pattern/deviations: Step-through pattern, Trunk flexed Gait velocity: decreased  General Gait  Details: pt endorses LBP that limits his activity tolerance. per spouse, pt does not ambulate far at baseline. pt was able to stand and ambulate with RW however when progressed to +1 UE support then to no UE support, pt becomes unsteady and required min assist at times to prevent LOB. Author encouraged both pt/pt's spouse to use RW at DC. Both are agreeable.    Balance Overall balance assessment: Needs assistance Sitting-balance support: No upper extremity supported, Feet supported Sitting balance-Leahy Scale: Good     Standing balance support: Bilateral upper extremity supported, During functional activity Standing balance-Leahy Scale: Fair Standing balance comment: pt is at high fall risk if standing without UE support     Communication Communication Communication: No apparent difficulties Factors Affecting Communication: Hearing impaired  Cognition Arousal: Alert Behavior During Therapy: WFL for tasks assessed/performed   PT - Cognitive impairments: No apparent impairments    Following commands: Intact      Cueing Cueing Techniques: Verbal cues     General Comments General comments (skin integrity, edema, etc.): VSS      Pertinent Vitals/Pain Pain Assessment Pain Assessment: 0-10 Pain Score: 4  Pain Location: chronic LBP Pain Descriptors / Indicators: Discomfort, Aching Pain Intervention(s): Limited activity within patient's tolerance, Monitored during session, Repositioned     PT Goals (current goals can now be found in the care plan section) Acute Rehab PT Goals Patient Stated Goal: to return home Progress towards PT goals: Progressing toward goals    Frequency    Min 2X/week       AM-PAC PT "6 Clicks" Mobility   Outcome Measure  Help needed turning from your back to your side while  in a flat bed without using bedrails?: None Help needed moving from lying on your back to sitting on the side of a flat bed without using bedrails?: None Help needed moving  to and from a bed to a chair (including a wheelchair)?: A Little Help needed standing up from a chair using your arms (e.g., wheelchair or bedside chair)?: A Little Help needed to walk in hospital room?: A Little Help needed climbing 3-5 steps with a railing? : A Little 6 Click Score: 20    End of Session   Activity Tolerance: Patient tolerated treatment well Patient left: in chair;with call bell/phone within reach;with chair alarm set Nurse Communication: Mobility status PT Visit Diagnosis: Muscle weakness (generalized) (M62.81);Difficulty in walking, not elsewhere classified (R26.2)     Time: 1610-9604 PT Time Calculation (min) (ACUTE ONLY): 21 min  Charges:    $Gait Training: 8-22 mins PT General Charges $$ ACUTE PT VISIT: 1 Visit                    Jetta Lout PTA 08/09/23, 2:26 PM

## 2023-08-09 NOTE — Care Management Important Message (Signed)
 Important Message  Patient Details  Name: Ronald [name] [facility] MRN: [id number] Date of Birth: 10/18/1945   Important Message Given:  Yes - Medicare IM     Cristela Blue, CMA 08/09/2023, 10:41 AM

## 2023-08-09 NOTE — TOC Initial Note (Signed)
 Transition of Care [facility]) - Progression Note    Patient Details  Name: Ronald [name] MRN: [id number] Date of Birth: 1945/09/25  [facility] CM/SW Contact  Truddie Hidden, RN Phone Number: 08/09/2023, 12:37 PM  Clinical Narrative:    Spoke with patient's wife regarding therapy's recommendation for [facility]. She is agreeable to [facility] and would like [facility] as patient was seen by them last year. She was advised the accepting agency will contact her to schedule an appointment within 48 hours. Patient's spouse will be able to transport at time of discharge.   Referral sent to Shaun from [facility].         Expected Discharge Plan and Services                                               Social Determinants of Health (SDOH) Interventions SDOH Screenings   Food Insecurity: No Food Insecurity (08/08/2023)  Housing: Low Risk  (08/08/2023)  Transportation Needs: No Transportation Needs (08/08/2023)  Utilities: Not At Risk (08/08/2023)  Alcohol Screen: Low Risk  (06/22/2023)  Depression (PHQ2-9): Low Risk  (07/26/2023)  Recent Concern: Depression (PHQ2-9) - Medium Risk (07/19/2023)  Financial Resource Strain: Low Risk  (06/23/2023)  Physical Activity: Inactive (06/23/2023)  Social Connections: Moderately Isolated (08/08/2023)  Stress: No Stress Concern Present (06/23/2023)  Tobacco Use: Medium Risk (07/31/2023)  Health Literacy: Adequate Health Literacy (06/23/2023)    Readmission Risk Interventions     No data to display

## 2023-08-09 NOTE — Discharge Summary (Signed)
 Ronald [name] [facility] BJY:[id number] DOB: Jul 21, 1945 DOA: 08/05/2023  PCP: Reubin Milan, MD  Admit date: 08/05/2023 Discharge date: 08/09/2023  Time spent: 35 minutes  Recommendations for Outpatient Follow-up:  Pcp f/u Heart failure clinic f/u 1 week     Discharge Diagnoses:  Principal Problem:   Near syncope Active Problems:   ESRD (end stage renal disease) on dialysis (HCC)   Anemia   Acute blood loss anemia (ABLA)   Hypotension   Tachycardia-bradycardia syndrome (HCC)   Discharge Condition: stable  Diet recommendation: heart healthy  Filed Weights   08/08/23 0812 08/08/23 1250 08/09/23 0451  Weight: 92.7 kg 91.7 kg 92 kg    History of present illness:  From admission h and p 78 y.o male with significant PMH of CAD s/p CABG in 1996 (LIMA-LAD, SVG-circumflex, SVG-RCA, SVG-diagonal) with redo bypass in 2007 (SVG-OM, SVG-RCA), ischemic cardiomyopathy, ESRD on HD, PVD DM 2, HTN, HLD, Gout, AAA, renal artery stenosis, COPD, GERD, Hypothyroidism, atrial fibrillation and PVCs who presented to the ED with chief complaints of dizziness with near syncopal episodes.   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 He presented to the ED on 07/22/23 with uncontrolled bleeding from upper lip laceration. Per wife, patient is on Eliquis and may have accidentally bitten his lip while asleep. The puncture wound was noted on the right upper lip that does not go past the vermilion border, it aligns with the lower incisor when he closes mouth.  Figure-of-eight suture was done with resolution of the bleeding and patient discharged to follow with his PCP. He returned to the ED on 07/23/23 with continued bleeding, blood clots were suctioned from the area and LET applied to the area. He was seen by his internal medicine doctor on the 25th, no bleeding was noted to the inner lip, the stitch was intact He returned to the ED again on 07/31/23 with continued  bleeding which again resolved and he was discharged home. Patient's wife state that he went to HD session this morning at 6am and after 3.5 hours of dialysis, he complained of dizziness and had near syncopal episode. She checked his blood pressure which was in the 80's so she called EMS. On EMS arrival, patient was found to be hypotensive with sbp in the 80's he received 500 cc bolus and was transported to the ED.  Hospital Course:  Patient presents with a near-syncopal event at home. 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 Here hgb 7.2 from baseline of above 8, given one unit and stable there. Apixaban held. Also found to be bradycardic. Advanced heart failure team consulted, advised holding home amio and metoprolol. Midodrine also started. They are ok with resuming home apixaban though at reduced dose at discharge (2.5 mg bid from 5). Patient has ambulated with PT with no recurrence of symptoms. PT/OT advise home health which has ordered. Nephrology maintained the patient on mwf hemodialysis. Discharging home with above med changes, patient has f/u in the CHF clinic next week.   Procedures: none   Consultations: Nephrology, advanced heart failure  Discharge Exam: Vitals:   08/09/23 1119 08/09/23 1436  BP: (!) 123/44 (!) 124/38  Pulse: (!) 57 (!) 52  Resp: 18 18  Temp: 97.8 F (36.6 C) 98 F (36.7 C)  SpO2: 94% 94%  General: NAD Cardiovascular: irreg irreg, brady, soft systolic murmur Respiratory: ctab save for faint rales at bases  Discharge Instructions   Discharge Instructions     Diet - low sodium heart healthy   Complete by: As directed    Increase activity slowly   Complete by: As directed       Allergies as of 08/09/2023       Reactions   Predicort [prednisolone] Other (See Comments)   Stomach pain   Ciprofloxacin Other (See  Comments)   GI upset   Hydrochlorothiazide Other (See Comments)   Dehydration   Hydrocodone Nausea Only, Other (See Comments)   Stomach upset   Hydrocodone-acetaminophen Nausea Only   Stomach upset   Sulfa Antibiotics Other (See Comments)   Cannot recall   Penicillins Hives, Rash, Other (See Comments)   Has patient had a PCN reaction causing immediate rash, facial/tongue/throat swelling, SOB or lightheadedness with hypotension: YES Has patient had a PCN reaction causing severe rash involving mucus membranes or skin necrosis: NO Has patient had a PCN reaction that required hospitalization NO Has patient had a PCN reaction occurring within the last 10 years:NO If all of the above answers are "NO", then may proceed with Cephalosporin use. Has patient had a PCN reaction causing immediate rash, facial/tongue/throat swelling, SOB or lightheadedness with hypotension: YES Has patient had a PCN reaction causing severe rash involving mucus membranes or skin necrosis: NO Has patient had a PCN reaction that required hospitalization NO Has patient had a PCN reaction occurring within the last 10 years:NO If all of the above answers are "NO", then may proceed with Cephalosporin use.        Medication List     PAUSE taking these medications    amiodarone 200 MG tablet Wait to take this until your doctor or other care provider tells you to start again. Commonly known as: PACERONE TAKE 1 TABLET BY MOUTH EVERY DAY   metoprolol succinate 100 MG 24 hr tablet Wait to take this until your doctor or other care provider tells you to start again. Commonly known as: Toprol XL Take 1 tablet (100 mg total) by mouth daily. Take with or immediately following a meal.       TAKE these medications    acetaminophen 650 MG CR tablet Commonly known as: TYLENOL Take 1,300 mg by mouth at bedtime.   allopurinol 100 MG tablet Commonly known as: ZYLOPRIM TAKE 1 TABLET BY MOUTH EVERYDAY AT BEDTIME   apixaban  2.5 MG Tabs tablet Commonly known as: Eliquis Take 1 tablet (2.5 mg total) by mouth 2 (two) times daily. What changed:  medication strength how much to take   atorvastatin 40 MG tablet Commonly known as: LIPITOR TAKE 1 TABLET BY MOUTH EVERYDAY AT BEDTIME   diphenhydrAMINE 50 MG capsule Commonly known as: BENADRYL Take 50 mg by mouth at bedtime.   furosemide 40 MG tablet Commonly known as: LASIX Take 40 mg by mouth daily as needed for edema. Taking 1 1/2 tablets (60mg  total) Pt takes on Tue. Thur. Sat.and Sunday's.   levothyroxine 75 MCG tablet Commonly known as: SYNTHROID TAKE 1 TABLET BY MOUTH EVERY DAY BEFORE BREAKFAST   midodrine 5 MG tablet Commonly known as: PROAMATINE Take 1 tablet (5 mg total) by mouth 3 (three) times daily with meals.   MULTIVITAMIN PO Take 1 tablet by mouth daily.   nitroGLYCERIN 0.4 MG SL tablet Commonly known as: NITROSTAT Place 0.4 mg under the tongue every 5 (five) minutes as needed  for chest pain (Up to 3 times).   pantoprazole 40 MG tablet Commonly known as: PROTONIX Take 1 tablet (40 mg total) by mouth 2 (two) times daily. Home med.   senna 8.6 MG tablet Commonly known as: SENOKOT Take 1 tablet by mouth as needed for constipation.   Vitamin D-3 125 MCG (5000 UT) Tabs Take 2,000 Units by mouth daily.       Allergies  Allergen Reactions   Predicort [Prednisolone] Other (See Comments)    Stomach pain   Ciprofloxacin Other (See Comments)    GI upset   Hydrochlorothiazide Other (See Comments)    Dehydration   Hydrocodone Nausea Only and Other (See Comments)    Stomach upset    Hydrocodone-Acetaminophen Nausea Only    Stomach upset   Sulfa Antibiotics Other (See Comments)    Cannot recall   Penicillins Hives, Rash and Other (See Comments)    Has patient had a PCN reaction causing immediate rash, facial/tongue/throat swelling, SOB or lightheadedness with hypotension: YES  Has patient had a PCN reaction causing severe rash  involving mucus membranes or skin necrosis: NO  Has patient had a PCN reaction that required hospitalization NO  Has patient had a PCN reaction occurring within the last 10 years:NO  If all of the above answers are "NO", then may proceed with Cephalosporin use.  Has patient had a PCN reaction causing immediate rash, facial/tongue/throat swelling, SOB or lightheadedness with hypotension: YES Has patient had a PCN reaction causing severe rash involving mucus membranes or skin necrosis: NO Has patient had a PCN reaction that required hospitalization NO Has patient had a PCN reaction occurring within the last 10 years:NO If all of the above answers are "NO", then may proceed with Cephalosporin use.    Follow-up Information     Reubin Milan, MD Follow up.   Specialty: Internal Medicine Contact information: [address] [place] [address] [phone]                  The results of significant diagnostics from this hospitalization (including imaging, microbiology, ancillary and laboratory) are listed below for reference.    Significant Diagnostic Studies: DG Chest Port 1 View Result Date: 08/05/2023 CLINICAL DATA:  Questionable sepsis. EXAM: PORTABLE CHEST 1 VIEW COMPARISON:  July 22, 2023 FINDINGS: There is stable right-sided venous catheter positioning. Multiple sternal wires and vascular clips are seen. The cardiac silhouette is enlarged and unchanged in size. Mild atelectasis and/or infiltrate is suspected within the retrocardiac region of the left lung base. There is a small left pleural effusion. No pneumothorax is identified. Multilevel degenerative changes seen throughout the thoracic spine. IMPRESSION: 1. Evidence of prior median sternotomy/CABG. 2. Mild left basilar atelectasis and/or infiltrate. 3. Small left pleural effusion. Electronically Signed   By: Aram Candela M.D.   On: 08/05/2023 22:08   DG Chest Port 1 View Result Date:  07/22/2023 CLINICAL DATA:  Possible blood aspiration. 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 The surgery site began bleeding while he was sleeping. The patient believes he was choking on blood when he woke up. EXAM: PORTABLE CHEST 1 VIEW COMPARISON:  AP Lat chest 05/31/2023. FINDINGS: Stable post CABG cardiomegaly. Right IJ dialysis catheter terminates in the right atrium, stable. There is mild central vascular fullness, with no overt edema. Chronic left lateral basal pleural-parenchymal disease continues to be noted. No focal pneumonia is evident.  The mediastinum is stable. There is aortic atherosclerosis. Osteopenia  and degenerative change of the spine. IMPRESSION: 1. No acute chest findings. 2. Chronic left lateral basal pleural-parenchymal disease. 3. Stable cardiomegaly.  Prior CABG. 4. Aortic atherosclerosis. Electronically Signed   By: Almira Bar M.D.   On: 07/22/2023 02:01   VAS US DUPLEX DIALYSIS ACCESS (AVF,AVG) Result Date: 07/14/2023 DIALYSIS ACCESS Patient Name:  RAWLIN REAUME Laura  Date of Exam:   07/14/2023 Medical Rec #: [id number]       Accession #:    [id number] Date of Birth: 12-03-1945       Patient Gender: M Patient Age:   78 years Exam Location:  [facility] Procedure:      VAS US DUPLEX DIALYSIS ACCESS (AVF, AVG) Referring Phys: [name] --------------------------------------------------------------------------------  Access Site: Left Upper Extremity. Access Type: Brachial-cephalic AVF. History: 12/10/2022: Lt Brachial Cephalic AVF created. Performing Technologist: Debbe Bales RVS  Examination Guidelines: A complete evaluation includes B-mode imaging, spectral Doppler, color Doppler, and power Doppler as needed of all accessible portions of each vessel. Unilateral testing is considered an integral part of a complete examination. Limited examinations for reoccurring indications may be performed as noted.  Findings:  +--------------------+----------+-----------------+--------+ AVF                 PSV (cm/s)Flow Vol (mL/min)Comments +--------------------+----------+-----------------+--------+ Native artery inflow    39          1495                +--------------------+----------+-----------------+--------+ AVF Anastomosis        166                              +--------------------+----------+-----------------+--------+  +---------------+----------+-------------+----------+--------+ OUTFLOW VEIN   PSV (cm/s)Diameter (cm)Depth (cm)Describe +---------------+----------+-------------+----------+--------+ Subclavian vein   102                                    +---------------+----------+-------------+----------+--------+ Confluence        118                                    +---------------+----------+-------------+----------+--------+ Shoulder           80                                    +---------------+----------+-------------+----------+--------+ Prox UA            73                                    +---------------+----------+-------------+----------+--------+ Mid UA            104                                    +---------------+----------+-------------+----------+--------+ Dist UA           288                                    +---------------+----------+-------------+----------+--------+  +--------------+-------------+---------+---------+---------+-------------------+  Diameter (cm)  Depth  Branching   PSV       Flow Volume                                  (cm)             (cm/s)       (ml/min)       +--------------+-------------+---------+---------+---------+-------------------+ Lt Rad Art                                      37                        Dist                                                                      +--------------+-------------+---------+---------+---------+-------------------+   Summary: Initial Imaging of The Left Brachial Cephalic AVF; The AVF appears to be patent throughout. Flow Volume appears to be Normal.  *See table(s) above for measurements and observations.  Diagnosing physician: [name] MD Electronically signed by [name] MD on 07/14/2023 at 12:30:13 PM.   --------------------------------------------------------------------------------   Final    VAS [place] ABI WITH/WO TBI Result Date: 07/14/2023  LOWER EXTREMITY DOPPLER STUDY Patient Name:  Harold Moncus [facility]  Date of Exam:   07/14/2023 Medical Rec #: [id number]       Accession #:    [id number] Date of Birth: 11-Apr-1946       Patient Gender: M Patient Age:   24 years Exam Location:  [facility] Procedure:      VAS [place] ABI WITH/WO TBI Referring Phys: [name] --------------------------------------------------------------------------------  Indications: Claudication.  Comparison Study: 05/20/2022 Performing Technologist: Debbe Bales RVS  Examination Guidelines: A complete evaluation includes at minimum, Doppler waveform signals and systolic blood pressure reading at the level of bilateral brachial, anterior tibial, and posterior tibial arteries, when vessel segments are accessible. Bilateral testing is considered an integral part of a complete examination. Photoelectric Plethysmograph (PPG) waveforms and toe systolic pressure readings are included as required and additional duplex testing as needed. Limited examinations for reoccurring indications may be performed as noted.  ABI Findings: +---------+------------------+-----+---------+--------+ Right    Rt Pressure (mmHg)IndexWaveform Comment  +---------+------------------+-----+---------+--------+ Brachial 112                                      +---------+------------------+-----+---------+--------+ ATA      155               1.38 triphasic         +---------+------------------+-----+---------+--------+ PTA      190                1.70 triphasicNC       +---------+------------------+-----+---------+--------+ Great Toe99                0.88 Normal            +---------+------------------+-----+---------+--------+ +---------+------------------+-----+---------+-------+ Left     Lt Pressure (mmHg)IndexWaveform Comment +---------+------------------+-----+---------+-------+  ATA      120               1.07 triphasic        +---------+------------------+-----+---------+-------+ PTA      137               1.22 triphasic        +---------+------------------+-----+---------+-------+ Great Toe103               0.92 Normal           +---------+------------------+-----+---------+-------+ +-------+-----------+-----------+------------+------------+ ABI/TBIToday's ABIToday's TBIPrevious ABIPrevious TBI +-------+-----------+-----------+------------+------------+ Right  >1.0     .88        .94         .88          +-------+-----------+-----------+------------+------------+ Left   1.22       .92        .85         1.11         +-------+-----------+-----------+------------+------------+ Bilateral ABIs appear increased compared to prior study on 05/20/2022. Right TBIs appear essentially unchanged compared to prior study on 05/20/2022. Lt TBIs appear to be decreased compared to prior study on 05/20/2022.  Summary: Right: Resting right ankle-brachial index indicates noncompressible right lower extremity arteries. The right toe-brachial index is normal. Left: Resting left ankle-brachial index is within normal range. The left toe-brachial index is normal. *See table(s) above for measurements and observations.  Electronically signed by [name] MD on 07/14/2023 at 12:30:05 PM.    Final    VAS [place] AAA DUPLEX Result Date: 07/14/2023 ABDOMINAL AORTA STUDY Patient Name:  ALEXEY RHOADS Irving  Date of Exam:   07/14/2023 Medical Rec #: [id number]       Accession #:    [id number] Date of Birth: [birth year]       Patient  Gender: M Patient Age:   51 years Exam Location:  [facility] Procedure:      VAS [place] AAA DUPLEX Referring Phys: [name] --------------------------------------------------------------------------------  Indications: Follow up exam for known AAA.  Performing Technologist: Debbe Bales RVS  Examination Guidelines: A complete evaluation includes B-mode imaging, spectral Doppler, color Doppler, and power Doppler as needed of all accessible portions of each vessel. Bilateral testing is considered an integral part of a complete examination. Limited examinations for reoccurring indications may be performed as noted.  Abdominal Aorta Findings: +-----------+-------+----------+----------+----------+--------+--------+ Location   AP (cm)Trans (cm)PSV (cm/s)Waveform  ThrombusComments +-----------+-------+----------+----------+----------+--------+--------+ Proximal   2.06   2.06      49        monophasic                 +-----------+-------+----------+----------+----------+--------+--------+ Mid        2.06   2.14      56        monophasic                 +-----------+-------+----------+----------+----------+--------+--------+ Distal     2.47   2.56      60        monophasic                 +-----------+-------+----------+----------+----------+--------+--------+ RT CIA Prox1.1    1.1       73        biphasic                   +-----------+-------+----------+----------+----------+--------+--------+ LT CIA Prox1.1    1.1       63  biphasic                   +-----------+-------+----------+----------+----------+--------+--------+  Summary: Abdominal Aorta: The largest aortic measurement is 2.6 cm. The Aorta appears to be decreased in size compared to prior study on 09/01/2020. The largest aortic diameter has decreased compared to prior exam. Previous diameter measurement was 3.1 cm obtained on 09/01/2020.  *See table(s) above for measurements and  observations.  Electronically signed by [name] MD on 07/14/2023 at 12:30:00 PM.    Final     Microbiology: Recent Results (from the past 240 hours)  Urine Culture     Status: Abnormal   Collection Time: 08/06/23  3:40 PM   Specimen: Urine, Random  Result Value Ref Range Status   Specimen Description   Final    URINE, RANDOM Performed at [facility], [address], [place], [place] [address]    Special Requests   Final    URINE, CLEAN CATCH Performed at [facility], [address] [place], [place] [address]    Culture MULTIPLE SPECIES PRESENT, SUGGEST RECOLLECTION (A)  Final   Report Status 08/07/2023 FINAL  Final  Culture, blood (Routine X 2) w Reflex to ID Panel     Status: None (Preliminary result)   Collection Time: 08/06/23  4:15 PM   Specimen: BLOOD  Result Value Ref Range Status   Specimen Description BLOOD BLOOD RIGHT ARM  Final   Special Requests   Final    BOTTLES DRAWN AEROBIC AND ANAEROBIC Blood Culture results may not be optimal due to an inadequate volume of blood received in culture bottles   Culture   Final    NO GROWTH 3 DAYS Performed at [facility], [address] [place], [place] [address]    Report Status PENDING  Incomplete  Culture, blood (Routine X 2) w Reflex to ID Panel     Status: None (Preliminary result)   Collection Time: 08/06/23  5:25 PM   Specimen: BLOOD  Result Value Ref Range Status   Specimen Description BLOOD RW  Final   Special Requests   Final    BOTTLES DRAWN AEROBIC AND ANAEROBIC Blood Culture adequate volume   Culture   Final    NO GROWTH 3 DAYS Performed at [facility], [address] [place], [place] [address]    Report Status PENDING  Incomplete     Labs: Basic Metabolic Panel: Recent Labs  Lab 08/05/23 1827 08/06/23 0627 08/07/23 0500 08/08/23 0828  NA 136 135 133* 135  K 3.8 4.1 4.4 4.4  CL 100 103 99 99  CO2 23 23 25 22   GLUCOSE 132* 105* 93 101*  BUN 23 29* 44*  58*  CREATININE 3.79* 4.84* 6.65* 8.04*  CALCIUM 7.7* 7.9* 8.3* 8.5*  MG  --  2.0 2.0  --   PHOS  --  5.2* 6.3* 7.2*   Liver Function Tests: Recent Labs  Lab 08/05/23 1827 08/08/23 0828  AST 39  --   ALT 26  --   ALKPHOS 121  --   BILITOT 1.0  --   PROT 5.7*  --   ALBUMIN 2.9* 2.8*   No results for input(s): "LIPASE", "AMYLASE" in the last 168 hours. No results for input(s): "AMMONIA" in the last 168 hours. CBC: Recent Labs  Lab 08/05/23 1827 08/06/23 0004 08/06/23 0627 08/06/23 1202 08/07/23 0500 08/07/23 1335 08/08/23 0025 08/08/23 2345 08/09/23 0532 08/09/23 1149  WBC 5.7  --  6.8  --  7.5  --   --   --   --   --   NEUTROABS 4.1  --   --   --   --   --   --   --   --   --   HGB 7.2*   < > 8.3*   < > 8.4* 8.4* 7.9* 7.6* 7.6* 8.1*  HCT 22.6*   < > 25.8*   < > 25.6* 25.6* 24.0* 22.5* 23.5* 24.4*  MCV 104.6*  --  100.4*  --  97.7  --   --   --   --   --   PLT 117*  --  107*  --  122*  --   --   --   --   --    < > = values in this interval not displayed.   Cardiac Enzymes: No results for input(s): "CKTOTAL", "CKMB", "CKMBINDEX", "TROPONINI" in the last 168 hours. BNP: BNP (last 3 results) Recent Labs    09/13/22 1429  BNP 1,700.3*    ProBNP (last 3 results) No results for input(s): "PROBNP" in the last 8760 hours.  CBG: Recent Labs  Lab 08/06/23 2010 08/06/23 2250 08/07/23 0512 08/07/23 1008 08/09/23 0444  GLUCAP 120* 127* 83 85 94       Signed:  Silvano Bilis MD.  [facility] 08/09/2023, 3:17 PM

## 2023-08-09 NOTE — Progress Notes (Signed)
 [facility]  ROUNDING NOTE   Subjective:   Ronald [name]  is a 78 year old male with past medical conditions including CAD, type 2 diabetes, emphysema, GERD, hyperlipidemia, hypertension, AAA, and end-stage renal disease on hemodialysis.  Patient presents to the emergency department with weakness and hypotension. He has been admitted for ESRD (end stage renal disease) on dialysis (HCC) [N18.6, Z99.2] Near syncope [R55] Hypotension, unspecified hypotension type [I95.9] Anemia, unspecified type [D64.9] Acute blood loss anemia (ABLA) [D62]   Patient is known to our practice and receives outpatient dialysis treatments at [facility].   Patient seen sitting up in chair States he feels well Dialysis yesterday, tolerated well Denies further bleeding   Objective:  Vital signs in last 24 hours:  Temp:  [97.5 F (36.4 C)-98.3 F (36.8 C)] 97.8 F (36.6 C) (03/11 1119) Pulse Rate:  [51-58] 57 (03/11 1119) Resp:  [17-18] 18 (03/11 1119) BP: (103-123)/(37-45) 123/44 (03/11 1119) SpO2:  [94 %-100 %] 94 % (03/11 1119) Weight:  [92 kg] 92 kg (03/11 0451)  Weight change: 0.2 kg Filed Weights   08/08/23 0812 08/08/23 1250 08/09/23 0451  Weight: 92.7 kg 91.7 kg 92 kg    Intake/Output: I/O last 3 completed shifts: In: 480 [P.O.:480] Out: 1000 [Other:1000]   Intake/Output this shift:  No intake/output data recorded.  Physical Exam: General: NAD  Head: Normocephalic, atraumatic. Moist oral mucosal membranes  Eyes: Anicteric  Lungs:  Clear to auscultation, normal effort  Heart: Regular rate and rhythm  Abdomen:  Soft, nontender  Extremities:  No peripheral edema.  Neurologic: Alert, moving all four extremities  Skin:  Upper lip abrasion  Access: Rt internal jugular tunneled access    Basic Metabolic Panel: Recent Labs  Lab 08/05/23 1827 08/06/23 0627 08/07/23 0500 08/08/23 0828  NA 136 135 133* 135  K 3.8 4.1 4.4 4.4  CL 100 103 99 99  CO2 23 23 25 22    GLUCOSE 132* 105* 93 101*  BUN 23 29* 44* 58*  CREATININE 3.79* 4.84* 6.65* 8.04*  CALCIUM 7.7* 7.9* 8.3* 8.5*  MG  --  2.0 2.0  --   PHOS  --  5.2* 6.3* 7.2*    Liver Function Tests: Recent Labs  Lab 08/05/23 1827 08/08/23 0828  AST 39  --   ALT 26  --   ALKPHOS 121  --   BILITOT 1.0  --   PROT 5.7*  --   ALBUMIN 2.9* 2.8*   No results for input(s): "LIPASE", "AMYLASE" in the last 168 hours. No results for input(s): "AMMONIA" in the last 168 hours.  CBC: Recent Labs  Lab 08/05/23 1827 08/06/23 0004 08/06/23 0627 08/06/23 1202 08/07/23 0500 08/07/23 1335 08/08/23 0025 08/08/23 2345 08/09/23 0532 08/09/23 1149  WBC 5.7  --  6.8  --  7.5  --   --   --   --   --   NEUTROABS 4.1  --   --   --   --   --   --   --   --   --   HGB 7.2*   < > 8.3*   < > 8.4* 8.4* 7.9* 7.6* 7.6* 8.1*  HCT 22.6*   < > 25.8*   < > 25.6* 25.6* 24.0* 22.5* 23.5* 24.4*  MCV 104.6*  --  100.4*  --  97.7  --   --   --   --   --   PLT 117*  --  107*  --  122*  --   --   --   --   --    < > =  values in this interval not displayed.    Cardiac Enzymes: No results for input(s): "CKTOTAL", "CKMB", "CKMBINDEX", "TROPONINI" in the last 168 hours.  BNP: Invalid input(s): "POCBNP"  CBG: Recent Labs  Lab 08/06/23 2010 08/06/23 2250 08/07/23 0512 08/07/23 1008 08/09/23 0444  GLUCAP 120* 127* 83 85 94    Microbiology: Results for orders placed or performed during the hospital encounter of 08/05/23  Urine Culture     Status: Abnormal   Collection Time: 08/06/23  3:40 PM   Specimen: Urine, Random  Result Value Ref Range Status   Specimen Description   Final    URINE, RANDOM Performed at [facility], [address] [place], [place] [address]    Special Requests   Final    URINE, CLEAN CATCH Performed at [facility], [address] [place], [place] [address]    Culture MULTIPLE SPECIES PRESENT, SUGGEST RECOLLECTION (A)  Final   Report Status 08/07/2023 FINAL  Final   Culture, blood (Routine X 2) w Reflex to ID Panel     Status: None (Preliminary result)   Collection Time: 08/06/23  4:15 PM   Specimen: BLOOD  Result Value Ref Range Status   Specimen Description BLOOD BLOOD RIGHT ARM  Final   Special Requests   Final    BOTTLES DRAWN AEROBIC AND ANAEROBIC Blood Culture results may not be optimal due to an inadequate volume of blood received in culture bottles   Culture   Final    NO GROWTH 3 DAYS Performed at [facility], [address], [place], [place] [address]    Report Status PENDING  Incomplete  Culture, blood (Routine X 2) w Reflex to ID Panel     Status: None (Preliminary result)   Collection Time: 08/06/23  5:25 PM   Specimen: BLOOD  Result Value Ref Range Status   Specimen Description BLOOD RW  Final   Special Requests   Final    BOTTLES DRAWN AEROBIC AND ANAEROBIC Blood Culture adequate volume   Culture   Final    NO GROWTH 3 DAYS Performed at [facility], [address] [place], [place] [address]    Report Status PENDING  Incomplete    Coagulation Studies: No results for input(s): "LABPROT", "INR" in the last 72 hours.   Urinalysis: Recent Labs    08/06/23 1540  COLORURINE AMBER*  LABSPEC 1.022  PHURINE 5.0  GLUCOSEU NEGATIVE  HGBUR SMALL*  BILIRUBINUR SMALL*  KETONESUR NEGATIVE  PROTEINUR >=300*  NITRITE NEGATIVE  LEUKOCYTESUR SMALL*      Imaging: No results found.   Medications:      allopurinol  100 mg Oral QHS   atorvastatin  40 mg Oral Daily   Chlorhexidine Gluconate Cloth  6 each Topical Q0600   cholecalciferol  2,000 Units Oral Daily   epoetin alfa-epbx (RETACRIT) injection  10,000 Units Intravenous Q M,W,F-1800   levothyroxine  75 mcg Oral Q0600   midodrine  5 mg Oral TID WC   multivitamin with minerals  1 tablet Oral Daily   pantoprazole  40 mg Oral BID   docusate sodium, polyethylene glycol  Assessment/ Plan:  Ronald [name] is a 78 y.o.  male  with past medical  conditions including CAD, type 2 diabetes, emphysema, GERD, hyperlipidemia, hypertension, AAA, and end-stage renal disease on hemodialysis.  Patient presents to the emergency department with weakness and hypotension. Patient has been admitted for ESRD (end stage renal disease) on dialysis (HCC) [N18.6, Z99.2] Near syncope [  R55] Hypotension, unspecified hypotension type [I95.9] Anemia, unspecified type [D64.9] Acute blood loss anemia (ABLA) [D62]   End stage renal disease on hemodialysis, Dialysis received yesterday, UF 1L achieved. Next treatment scheduled for Wednesday.   2. Anemia of chronic kidney disease with acute blood loss Lab Results  Component Value Date   HGB 8.1 (L) 08/09/2023    Mircera received in outpatient clinic. Patient has received blood transfusion during this admission. Admitting complaint of weakness due to lip wound, eliquis outpatient. Continue EPO 10000 units with dialysis.   3. Secondary Hyperparathyroidism: with outpatient labs: PTH 357, phosphorus 6.3, calcium 8.3 on 07/11/23.   Lab Results  Component Value Date   CALCIUM 8.5 (L) 08/08/2023   PHOS 7.2 (H) 08/08/2023    Patient prescribed sevelamer with meals.   4. Hypotension. Home regimen includes furosemide, amiodarone, metoprolol. All held. Receiving Midodrine. Blood pressure stable   LOS: 4 Zayaan Kozak 3/11/20251:41 PM

## 2023-08-09 NOTE — Progress Notes (Signed)
 PT Cancellation Note  Patient Details Name: Ronald [name] MRN: [id number] DOB: 02-Aug-1945   Cancelled Treatment:     PT attempt. Pt was seated in recliner with supportive spouse present. Pt just finished ambulation with OT in hallway. PT will return at a later time. DC recs remain appropriate. Recs: Home with HHPT .    Rushie Chestnut 08/09/2023, 10:51 AM

## 2023-08-09 NOTE — Discharge Planning (Signed)
 ESTABISHED HEMODIALYSIS  Outpatient Facility [facility]  [phone] [address], [place] [address] [phone]  Schedule: MWF 5:30am  Dimas Chyle Dialysis Coordinator II  [facility] Cell: [phone] eFax: [phone] [email or web address]

## 2023-08-10 DIAGNOSIS — Z992 Dependence on renal dialysis: Secondary | ICD-10-CM | POA: Diagnosis not present

## 2023-08-10 DIAGNOSIS — N186 End stage renal disease: Secondary | ICD-10-CM | POA: Diagnosis not present

## 2023-08-11 LAB — CULTURE, BLOOD (ROUTINE X 2)
Culture: NO GROWTH
Culture: NO GROWTH
Special Requests: ADEQUATE

## 2023-08-12 DIAGNOSIS — N186 End stage renal disease: Secondary | ICD-10-CM | POA: Diagnosis not present

## 2023-08-12 DIAGNOSIS — Z992 Dependence on renal dialysis: Secondary | ICD-10-CM | POA: Diagnosis not present

## 2023-08-15 ENCOUNTER — Encounter: Payer: Self-pay | Admitting: Internal Medicine

## 2023-08-15 ENCOUNTER — Ambulatory Visit (INDEPENDENT_AMBULATORY_CARE_PROVIDER_SITE_OTHER): Admitting: Internal Medicine

## 2023-08-15 VITALS — BP 132/68 | HR 61 | Ht 69.0 in | Wt 208.2 lb

## 2023-08-15 DIAGNOSIS — D500 Iron deficiency anemia secondary to blood loss (chronic): Secondary | ICD-10-CM | POA: Diagnosis not present

## 2023-08-15 DIAGNOSIS — Z992 Dependence on renal dialysis: Secondary | ICD-10-CM | POA: Diagnosis not present

## 2023-08-15 DIAGNOSIS — I480 Paroxysmal atrial fibrillation: Secondary | ICD-10-CM | POA: Diagnosis not present

## 2023-08-15 DIAGNOSIS — I10 Essential (primary) hypertension: Secondary | ICD-10-CM | POA: Diagnosis not present

## 2023-08-15 DIAGNOSIS — S41112A Laceration without foreign body of left upper arm, initial encounter: Secondary | ICD-10-CM | POA: Diagnosis not present

## 2023-08-15 DIAGNOSIS — N186 End stage renal disease: Secondary | ICD-10-CM | POA: Diagnosis not present

## 2023-08-15 NOTE — Progress Notes (Signed)
 Date:  08/15/2023   Name:  Ronald [name] [facility]   DOB:  04-04-1946   MRN:  [id number]   Chief Complaint: Hospitalization Follow-up and Wound Check (Location on left arm, from dialysis) Admitted to [facility] 08/05/23 to 08/09/23.    No TOC call was done. HPI Hospital Course:  Patient presents with a near-syncopal event at home. 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 Here hgb 7.2 from baseline of above 8, given one unit and stable there. Apixaban held. Also found to be bradycardic. Advanced heart failure team consulted, advised holding home amio and metoprolol. Midodrine also started. They are ok with resuming home apixaban though at reduced dose at discharge (2.5 mg bid from 5). Patient has ambulated with PT with no recurrence of symptoms. PT/OT advise home health which has ordered. Nephrology maintained the patient on mwf hemodialysis. Discharging home with above med changes, patient has f/u in the CHF clinic next week.  He also now has a skin tear on his left upper arm from tape during HD.  His wife has been caring for the area since Friday - it continues to ooze blood.  He is now back on Eliquis.  The picture she showed me was an area about 2.5 x 2 cm on the anterior distal left upper arm.  I did not remove the dressing due to lack of appropriate dressing material.  Review of Systems  Constitutional:  Negative for chills, fatigue and fever.  HENT:  Negative for trouble swallowing.   Respiratory:  Negative for chest tightness and shortness of breath.   Cardiovascular:  Negative for chest pain and palpitations.  Skin:  Positive for wound.  Neurological:  Negative for dizziness and headaches.  Psychiatric/Behavioral:  Negative for dysphoric mood and sleep disturbance. The patient is not nervous/anxious.      Lab Results  Component Value Date   NA 135 08/08/2023   K 4.4  08/08/2023   CO2 22 08/08/2023   GLUCOSE 101 (H) 08/08/2023   BUN 58 (H) 08/08/2023   CREATININE 8.04 (H) 08/08/2023   CALCIUM 8.5 (L) 08/08/2023   EGFR 14 (L) 05/19/2022   GFRNONAA 6 (L) 08/08/2023   Lab Results  Component Value Date   CHOL 127 07/19/2023   HDL 49 07/19/2023   LDLCALC 65 07/19/2023   TRIG 59 07/19/2023   CHOLHDL 2.6 07/19/2023   Lab Results  Component Value Date   TSH 6.584 (H) 08/06/2023   Lab Results  Component Value Date   HGBA1C 4.7 (L) 08/06/2023   Lab Results  Component Value Date   WBC 7.5 08/07/2023   HGB 8.1 (L) 08/09/2023   HCT 24.4 (L) 08/09/2023   MCV 97.7 08/07/2023   PLT 122 (L) 08/07/2023   Lab Results  Component Value Date   ALT 26 08/05/2023   AST 39 08/05/2023   ALKPHOS 121 08/05/2023   BILITOT 1.0 08/05/2023   Lab Results  Component Value Date   VD25OH 83 09/06/2022     Patient Active Problem List   Diagnosis Date Noted   Hypotension 08/07/2023   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   Near syncope 08/05/2023   Acute blood loss anemia (ABLA) 08/05/2023   HFrEF (heart failure with reduced ejection fraction) (HCC) 07/19/2023   Muscle tension headache 11/15/2022   Acquired thrombophilia (HCC) 11/10/2022   ESRD (end stage  renal disease) on dialysis (HCC) 09/16/2022   Physical deconditioning 09/16/2022   Bilateral leg edema 09/13/2022   Coronary artery disease of native artery of native heart with stable angina pectoris (HCC) 07/22/2022   OSA (obstructive sleep apnea) 07/22/2022   Chronic systolic CHF (congestive heart failure) (HCC) 07/15/2022   Typical atrial flutter (HCC) 07/15/2022   Acquired hypothyroidism 05/28/2022   GERD without esophagitis 05/28/2022   Essential hypertension 05/05/2022   Prediabetes 04/06/2022   Spinal stenosis of lumbar region without neurogenic claudication 04/06/2022   Weakness of both lower extremities 04/06/2022   Upper airway cough syndrome 10/01/2021   Bursitis of hip  12/18/2018   Paroxysmal atrial fibrillation (HCC) 06/10/2018   Atherosclerosis of native arteries of extremity with intermittent claudication (HCC) 06/25/2016   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   Coronary artery disease involving native coronary artery with angina pectoris (HCC)    Allergic rhinitis 08/19/2015   Anemia 10/04/2014   AA (aortic aneurysm) (HCC) 10/04/2014   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   Basal cell papilloma 10/04/2014   Unspecified osteoarthritis, unspecified site 10/04/2014   Thoracic aortic aneurysm (TAA) (HCC) 10/04/2014   Atherosclerotic heart disease of native coronary artery without angina pectoris 10/04/2014   Benign prostatic hyperplasia with lower urinary tract symptoms 10/24/2012   DOE (dyspnea on exertion) 11/12/2009   Renal artery stenosis     Mixed hyperlipidemia 09/04/2008   History of redo bypass grafting 09/04/2008    Allergies  Allergen Reactions   Predicort [Prednisolone] Other (See Comments)    Stomach pain   Ciprofloxacin Other (See Comments)    GI upset   Hydrochlorothiazide Other (See Comments)    Dehydration   Hydrocodone Nausea Only and Other (See Comments)    Stomach upset    Hydrocodone-Acetaminophen Nausea Only    Stomach upset   Sulfa Antibiotics Other (See Comments)    Cannot recall   Penicillins Hives, Rash and Other (See Comments)    Has patient had a PCN reaction causing immediate rash, facial/tongue/throat swelling, SOB or lightheadedness with hypotension: YES  Has patient had a PCN reaction causing severe rash involving mucus membranes or skin necrosis: NO  Has patient had a PCN reaction that required hospitalization NO  Has patient had a PCN reaction occurring within the last 10 years:NO  If all of the above answers are "NO", then may proceed with Cephalosporin use.  Has patient had a PCN reaction causing immediate rash, facial/tongue/throat swelling, SOB or lightheadedness with hypotension: YES Has patient had a  PCN reaction causing severe rash involving mucus membranes or skin necrosis: NO Has patient had a PCN reaction that required hospitalization NO Has patient had a PCN reaction occurring within the last 10 years:NO If all of the above answers are "NO", then may proceed with Cephalosporin use.    Past Surgical History:  Procedure Laterality Date   CARDIAC CATHETERIZATION  "several"   CARDIAC CATHETERIZATION N/A 12/13/2014   Procedure: Left Heart Cath and Coronary Angiography;  Surgeon: Corky Crafts, MD;  Location: [facility] INVASIVE CV LAB;  Service: Cardiovascular;  Laterality: N/A;   CARDIAC CATHETERIZATION  12/13/2014   Procedure: Coronary Stent Intervention;  Surgeon: Corky Crafts, MD;  Location: [facility] INVASIVE CV LAB;  Service: Cardiovascular;;   CARDIAC CATHETERIZATION N/A 10/07/2015   Procedure: Left Heart Cath and Cors/Grafts Angiography;  Surgeon: Kathleene Hazel, MD;  Location: [facility] INVASIVE CV LAB;  Service: Cardiovascular;  Laterality: N/A;   CARDIAC CATHETERIZATION N/A 10/07/2015   Procedure: Coronary Stent Intervention;  Surgeon:  Kathleene Hazel, MD;  Location: [facility] INVASIVE CV LAB;  Service: Cardiovascular;  Laterality: N/A;   CARDIOVERSION N/A 05/25/2022   Procedure: CARDIOVERSION;  Surgeon: Thomasene Ripple, DO;  Location: [facility] ENDOSCOPY;  Service: Cardiovascular;  Laterality: N/A;   CATARACT EXTRACTION W/PHACO Left 10/21/2021   Procedure: CATARACT EXTRACTION PHACO AND INTRAOCULAR LENS PLACEMENT (IOC) LEFT 3.94 00:33.7;  Surgeon: Nevada Crane, MD;  Location: [facility]ERY [facility];  Service: Ophthalmology;  Laterality: Left;   CATARACT EXTRACTION W/PHACO Right 11/09/2021   Procedure: CATARACT EXTRACTION PHACO AND INTRAOCULAR LENS PLACEMENT (IOC) RIGHT;  Surgeon: Nevada Crane, MD;  Location: [facility];  Service: Ophthalmology;  Laterality: Right;  3.59 0:29.4   CORONARY ANGIOPLASTY  "several"   CORONARY ANGIOPLASTY WITH STENT PLACEMENT  2005; 12/13/2014    "2; 1"   CORONARY ARTERY BYPASS GRAFT  05/31/1994   CABG X5   CORONARY ARTERY BYPASS GRAFT  07/29/2005   CABG X3   DIALYSIS/PERMA CATHETER INSERTION N/A 09/10/2022   Procedure: DIALYSIS/PERMA CATHETER INSERTION;  Surgeon: Renford Dills, MD;  Location: [facility] INVASIVE CV LAB;  Service: Cardiovascular;  Laterality: N/A;   ESOPHAGOGASTRODUODENOSCOPY (EGD) WITH ESOPHAGEAL DILATION  05/31/1998   GREEN LIGHT LASER TURP (TRANSURETHRAL RESECTION OF PROSTATE  01/30/1999   "not cancerous"   HERNIA REPAIR     LAPAROSCOPIC CHOLECYSTECTOMY     LEFT HEART CATH AND CORS/GRAFTS ANGIOGRAPHY N/A 07/28/2017   Procedure: LEFT HEART CATH AND CORS/GRAFTS ANGIOGRAPHY;  Surgeon: Lennette Bihari, MD;  Location: [facility] INVASIVE CV LAB;  Service: Cardiovascular;  Laterality: N/A;   LUNG SURGERY  05/31/1994   "S/P CABG, had to put staple in lung after it had collapsed"   MINOR REMOVAL OF PERITONEAL DIALYSIS CATHETER  10/2022   UMBILICAL HERNIA REPAIR     w/chole    Social History   Tobacco Use   Smoking status: Former    Current packs/day: 0.00    Average packs/day: 3.0 packs/day for 20.0 years (60.0 ttl pk-yrs)    Types: Cigarettes    Start date: 07/25/1966    Quit date: 07/25/1986    Years since quitting: 37.0   Smokeless tobacco: Never  Vaping Use   Vaping status: Never Used  Substance Use Topics   Alcohol use: No    Alcohol/week: 0.0 standard drinks of alcohol   Drug use: No     Medication list has been reviewed and updated.  Current Meds  Medication Sig   acetaminophen (TYLENOL) 650 MG CR tablet Take 1,300 mg by mouth at bedtime.   allopurinol (ZYLOPRIM) 100 MG tablet TAKE 1 TABLET BY MOUTH EVERYDAY AT BEDTIME   apixaban (ELIQUIS) 2.5 MG TABS tablet Take 1 tablet (2.5 mg total) by mouth 2 (two) times daily.   atorvastatin (LIPITOR) 40 MG tablet TAKE 1 TABLET BY MOUTH EVERYDAY AT BEDTIME   Cholecalciferol (VITAMIN D-3) 125 MCG (5000 UT) TABS Take 2,000 Units by mouth daily.    diphenhydrAMINE  (BENADRYL) 50 MG capsule Take 50 mg by mouth at bedtime.   levothyroxine (SYNTHROID) 75 MCG tablet TAKE 1 TABLET BY MOUTH EVERY DAY BEFORE BREAKFAST   midodrine (PROAMATINE) 5 MG tablet Take 1 tablet (5 mg total) by mouth 3 (three) times daily with meals.   Multiple Vitamin (MULTIVITAMIN PO) Take 1 tablet by mouth daily.   nitroGLYCERIN (NITROSTAT) 0.4 MG SL tablet Place 0.4 mg under the tongue every 5 (five) minutes as needed for chest pain (Up to 3 times).   pantoprazole (PROTONIX) 40 MG tablet Take 1  tablet (40 mg total) by mouth 2 (two) times daily. Home med.   senna (SENOKOT) 8.6 MG tablet Take 1 tablet by mouth as needed for constipation.       08/15/2023    1:44 PM 07/26/2023    1:58 PM 07/19/2023    9:37 AM 05/27/2023    1:46 PM  GAD 7 : Generalized Anxiety Score  Nervous, Anxious, on Edge 2 0 0 0  Control/stop worrying 0 0 1 0  Worry too much - different things 0 0 1 0  Trouble relaxing 0 0 1 0  Restless 2 0 1 0  Easily annoyed or irritable 2 0 0 2  Afraid - awful might happen 2 0 0 0  Total GAD 7 Score 8 0 4 2  Anxiety Difficulty  Not difficult at all Not difficult at all Not difficult at all       08/15/2023    1:44 PM 07/26/2023    1:58 PM 07/19/2023    9:36 AM  Depression screen PHQ 2/9  Decreased Interest 0 0 1  Down, Depressed, Hopeless 0 0 0  PHQ - 2 Score 0 0 1  Altered sleeping 0 0 3  Tired, decreased energy 3 0 3  Change in appetite 0 0 0  Feeling bad or failure about yourself  0 0 1  Trouble concentrating 0 0 0  Moving slowly or fidgety/restless 3 0 0  Suicidal thoughts 0 0 0  PHQ-9 Score 6 0 8  Difficult doing work/chores Somewhat difficult Not difficult at all Not difficult at all    BP Readings from Last 3 Encounters:  08/15/23 132/68  08/09/23 (!) 124/38  07/31/23 (!) 111/52    Physical Exam Constitutional:      Appearance: Normal appearance.  Cardiovascular:     Rate and Rhythm: Normal rate and regular rhythm.     Heart sounds: No murmur  heard. Pulmonary:     Effort: Pulmonary effort is normal.  Musculoskeletal:     Cervical back: Normal range of motion.     Right lower leg: Edema present.     Left lower leg: Edema present.  Lymphadenopathy:     Cervical: No cervical adenopathy.  Skin:    Comments: Wound on left arm - dressing with coban - bright red oozing.  Neurological:     General: No focal deficit present.     Mental Status: He is alert.  Psychiatric:        Mood and Affect: Mood normal.        Behavior: Behavior normal.     Wt Readings from Last 3 Encounters:  08/15/23 208 lb 4 oz (94.5 kg)  08/09/23 202 lb 13.2 oz (92 kg)  07/31/23 203 lb (92.1 kg)    BP 132/68   Pulse 61   Ht 5\' 9"  (1.753 m)   Wt 208 lb 4 oz (94.5 kg)   SpO2 98%   BMI 30.75 kg/m   Assessment and Plan:  Problem List Items Addressed This Visit       Unprioritized   Anemia - Primary   Recently hospitalized with anemia and low BP due to blood loss      Paroxysmal atrial fibrillation (HCC) (Chronic)   Now off of amiodarone and metoprolol Will continue to monitor HR Seeing cardiology this week      Essential hypertension (Chronic)   Orthostatic hypotension - metoprolol held and Midodrine started      Other Visit Diagnoses  Skin tear of left upper arm without complication, initial encounter       continue local care with non stick dressing hold eliquis until bleeding stops       No follow-ups on file.    Reubin Milan, MD [facility] [place]

## 2023-08-15 NOTE — Assessment & Plan Note (Signed)
 Recently hospitalized with anemia and low BP due to blood loss

## 2023-08-15 NOTE — Patient Instructions (Signed)
 Hold Eliquis for at least a week until skin tear can stop bleeding.

## 2023-08-15 NOTE — Assessment & Plan Note (Signed)
 Now off of amiodarone and metoprolol Will continue to monitor HR Seeing cardiology this week

## 2023-08-15 NOTE — Assessment & Plan Note (Signed)
 Orthostatic hypotension - metoprolol held and Midodrine started

## 2023-08-17 ENCOUNTER — Telehealth: Payer: Self-pay | Admitting: Cardiology

## 2023-08-17 DIAGNOSIS — N186 End stage renal disease: Secondary | ICD-10-CM | POA: Diagnosis not present

## 2023-08-17 DIAGNOSIS — Z992 Dependence on renal dialysis: Secondary | ICD-10-CM | POA: Diagnosis not present

## 2023-08-17 NOTE — Telephone Encounter (Signed)
 Lvm to confirm appt 08/18/23

## 2023-08-18 ENCOUNTER — Ambulatory Visit: Payer: Medicare Other | Attending: Cardiology | Admitting: Cardiology

## 2023-08-18 ENCOUNTER — Encounter: Payer: Self-pay | Admitting: Cardiology

## 2023-08-18 VITALS — BP 128/62 | HR 100 | Wt 208.1 lb

## 2023-08-18 DIAGNOSIS — E1122 Type 2 diabetes mellitus with diabetic chronic kidney disease: Secondary | ICD-10-CM | POA: Diagnosis not present

## 2023-08-18 DIAGNOSIS — I5020 Unspecified systolic (congestive) heart failure: Secondary | ICD-10-CM | POA: Diagnosis not present

## 2023-08-18 DIAGNOSIS — N186 End stage renal disease: Secondary | ICD-10-CM | POA: Diagnosis not present

## 2023-08-18 DIAGNOSIS — I480 Paroxysmal atrial fibrillation: Secondary | ICD-10-CM

## 2023-08-18 NOTE — Patient Instructions (Signed)
 Medication Changes:  STOP Metoprolol, Amiodarone, and Eliquis  Follow-Up in: Please follow up with the [facility] in 3 months with Dr. Elwyn Lade  At the [facility], you and your health needs are our priority. We have a designated team specialized in the treatment of Heart Failure. This Care Team includes your primary Heart Failure Specialized Cardiologist (physician), Advanced Practice Providers (APPs- Physician Assistants and Nurse Practitioners), and Pharmacist who all work together to provide you with the care you need, when you need it.   You may see any of the following providers on your designated Care Team at your next follow up:  Dr. Arvilla Meres Dr. Marca Ancona Dr. Dorthula Nettles Dr. Theresia Bough Clarisa Kindred, FNP Enos Fling, RPH-CPP  Please be sure to bring in all your medications bottles to every appointment.   Need to Contact us:  If you have any questions or concerns before your next appointment please send [place] a message through [place] or call our office at [phone].    TO LEAVE A MESSAGE FOR THE NURSE SELECT OPTION 2, PLEASE LEAVE A MESSAGE INCLUDING: YOUR NAME DATE OF BIRTH CALL BACK NUMBER REASON FOR CALL**this is important as we prioritize the call backs  YOU WILL RECEIVE A CALL BACK THE SAME DAY AS LONG AS YOU CALL BEFORE 4:00 PM

## 2023-08-19 DIAGNOSIS — N186 End stage renal disease: Secondary | ICD-10-CM | POA: Diagnosis not present

## 2023-08-19 DIAGNOSIS — Z992 Dependence on renal dialysis: Secondary | ICD-10-CM | POA: Diagnosis not present

## 2023-08-22 DIAGNOSIS — Z992 Dependence on renal dialysis: Secondary | ICD-10-CM | POA: Diagnosis not present

## 2023-08-22 DIAGNOSIS — N186 End stage renal disease: Secondary | ICD-10-CM | POA: Diagnosis not present

## 2023-08-24 DIAGNOSIS — Z992 Dependence on renal dialysis: Secondary | ICD-10-CM | POA: Diagnosis not present

## 2023-08-24 DIAGNOSIS — N186 End stage renal disease: Secondary | ICD-10-CM | POA: Diagnosis not present

## 2023-08-25 ENCOUNTER — Ambulatory Visit: Payer: Medicare Other | Admitting: Cardiovascular Disease

## 2023-08-26 DIAGNOSIS — Z992 Dependence on renal dialysis: Secondary | ICD-10-CM | POA: Diagnosis not present

## 2023-08-26 DIAGNOSIS — N186 End stage renal disease: Secondary | ICD-10-CM | POA: Diagnosis not present

## 2023-08-29 DIAGNOSIS — N186 End stage renal disease: Secondary | ICD-10-CM | POA: Diagnosis not present

## 2023-08-29 DIAGNOSIS — Z992 Dependence on renal dialysis: Secondary | ICD-10-CM | POA: Diagnosis not present

## 2023-08-30 NOTE — Progress Notes (Signed)
 ADVANCED HEART FAILURE FOLLOW UP CLINIC NOTE  Referring Physician: Reubin Milan, MD  Primary Care: Reubin Milan, MD Primary Cardiologist:  HPI: Ronald [name] is a 78 y.o. male with a PMH of  AAA, CAD, DM, hyperlipidemia, HTN, CKD, COPD, GERD, gout, pneumonia, atrial fibrillation, previous tobacco use and chronic heart failure who presents for follow up of chronic systolic heart failure.          Left heart catheterization 07/28/2017 showed significant native CAD with total occlusion of LAD after the takeoff of first diagonal vessel with total occlusion of proximal left circumflex coronary artery and total occlusion of proximal RCA with antegrade bridging collaterals.  Patent LIMA to LAD.  Patent Y graft from surgery which supplies diagonal and distal circumflex marginal vessels.    Was in the ED 11/10/2022 after a fall getting off his lawnmower.  CT scan negative for acute traumatic injury.  Was in the ED at 11/08/2022 due to headache on the right side.  Hit his head roughly a week prior on the left side.  CT scan reassuring with no acute abnormalities or skull fracture.  Patient had labs which were reassuring.  Admitted 07/15/2022 due to midsternal chest pain.  Heparin drip and medical management.  Admitted 05/28/2022 due to history of heart failure.  Diuresis with Lasix gtt.  Cardiology and nephrology consults obtained.    Continues to have leg swelling but markedly improved since starting dialysis M, W, F.  He was trialed on peritoneal dialysis but unfortunately his catheter did not function well.  He also has a left upper arm fistula in place that has not started working yet either.      SUBJECTIVE:  Overall feels he is doing much worse. Multiple recent hospital visits and complications related to bleeding including at his iHD graft site. He reports ongoing fatigue and shortness of breath that is limiting. He is taking midodrine in order to be able to tolerate iHD. He and his  wife are concerned about repeat bleeding episodes.   PMH, current medications, allergies, social history, and family history reviewed in epic.  PHYSICAL EXAM: Vitals:   08/18/23 1359  BP: 128/62  Pulse: 100  SpO2: 99%   GENERAL: chronically ill appearing PULM:  Mild labored breathing  CARDIAC:  JVP: difficult to assess         Irregular rate and rhythm, systolic murmur ABDOMEN: Soft, non-tender, non-distended. NEUROLOGIC: Patient is oriented x3 with no focal or lateralizing neurologic deficits.    DATA REVIEW  ECG: 10/2022: Sinus tachycardia, right axis deviation, pulmonary disease pattern.   ECHO: 12/23: LVEF 35 to 40%, global hypokinesis, mildly dilated LV, RV systolic function mildly reduced.   03/2023: Severely reduced LVEF of 20 to 25%, global hypokinesis though with preserved inferior wall motion.  Moderate mitral regurgitation, moderate tricuspid regurgitation.   CATH: [facility] 07/2017: Negative circulation are occluded, patent LIMA to LAD Y graft to RCA circumflex with moderate disease.    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 Metoprolol stopped at most recent ED visit given  orthostatic hypotension. Given his worsening clinical course would not recommend ICD placement.  -Continue to hold metoprolol  -Continue furosemide  nondialysis days -Continue midodrine for BP support -Not an ICD candidate -PYP scan not completed, but at this time is likely to advanced  to benefit significantly from treatment if there is a aTTR component   HTN w/ CKD: BP controlled today -Therapy as above    DM- - A1c 09/06/22 was 5.8%   PAF: cardioverted 04/2022, back in atrial fibrillation. Long risk/benefit discussion had today. Given his ongoing bleeding issues, would recommend holding his blood thinner at this time, acknowledging there is a heightened risk of stroke. Family expressed understanding. Is not a watchman candidate.  - Stop amiodarone, continue to hold apixaban   ESRD: right IJ dialysis catheter for access, fistula is not currently working due to bleeding.  Follow up in 3 months  Clearnce Hasten, MD Advanced Heart Failure Mechanical Circulatory Support 08/30/23

## 2023-08-31 DIAGNOSIS — Z992 Dependence on renal dialysis: Secondary | ICD-10-CM | POA: Diagnosis not present

## 2023-08-31 DIAGNOSIS — Z23 Encounter for immunization: Secondary | ICD-10-CM | POA: Diagnosis not present

## 2023-08-31 DIAGNOSIS — N186 End stage renal disease: Secondary | ICD-10-CM | POA: Diagnosis not present

## 2023-09-02 DIAGNOSIS — Z992 Dependence on renal dialysis: Secondary | ICD-10-CM | POA: Diagnosis not present

## 2023-09-02 DIAGNOSIS — Z23 Encounter for immunization: Secondary | ICD-10-CM | POA: Diagnosis not present

## 2023-09-02 DIAGNOSIS — N186 End stage renal disease: Secondary | ICD-10-CM | POA: Diagnosis not present

## 2023-09-05 ENCOUNTER — Ambulatory Visit (INDEPENDENT_AMBULATORY_CARE_PROVIDER_SITE_OTHER): Admitting: Internal Medicine

## 2023-09-05 ENCOUNTER — Ambulatory Visit: Payer: Self-pay

## 2023-09-05 ENCOUNTER — Encounter: Payer: Self-pay | Admitting: Internal Medicine

## 2023-09-05 VITALS — BP 112/66 | HR 99 | Ht 69.0 in | Wt 206.0 lb

## 2023-09-05 DIAGNOSIS — Z23 Encounter for immunization: Secondary | ICD-10-CM | POA: Diagnosis not present

## 2023-09-05 DIAGNOSIS — L2989 Other pruritus: Secondary | ICD-10-CM | POA: Diagnosis not present

## 2023-09-05 DIAGNOSIS — R251 Tremor, unspecified: Secondary | ICD-10-CM | POA: Diagnosis not present

## 2023-09-05 DIAGNOSIS — Z992 Dependence on renal dialysis: Secondary | ICD-10-CM | POA: Diagnosis not present

## 2023-09-05 DIAGNOSIS — R600 Localized edema: Secondary | ICD-10-CM

## 2023-09-05 DIAGNOSIS — N186 End stage renal disease: Secondary | ICD-10-CM | POA: Diagnosis not present

## 2023-09-05 MED ORDER — DOXEPIN HCL 25 MG PO CAPS: 25.0000 mg | ORAL_CAPSULE | Freq: Every day | ORAL | 0 refills | Status: DC

## 2023-09-05 NOTE — Assessment & Plan Note (Signed)
 Likely essential tremor No treatment currently - if worsening would recommend Neurology evaluation

## 2023-09-05 NOTE — Progress Notes (Signed)
 Date:  09/05/2023   Name:  [name]   DOB:  22-Feb-1946   MRN:  [id number]   Chief Complaint: Rash (Patient said he is itching all over, has been having it for a few weeks ) and Foot Swelling (Patient said the foot swelling has been a couple of weeks since it started)  HPI Itching all over - for the past month or so.  No rash, taking Claritin and Benadryl.    LE edema - needs to have HD tweaked a bit.  He sleeps in a recliner due to shortness and of breath and can not get on TED hose.  His wife does not think that she can do it either.  He takes lasix 40 mg on non HD days.  He has some leg cramps - recommended Mag and K rich foods by cardiology.  Tremor - new in both hands but no consistently.  No head tremor, no tremor at rest. Able to control it at times.  Interfering with ADLs off and on.  Review of Systems  Constitutional:  Negative for chills, fatigue and fever.  Respiratory:  Positive for shortness of breath. Negative for chest tightness and wheezing.   Cardiovascular:  Positive for leg swelling. Negative for chest pain.  Neurological:  Positive for tremors and light-headedness. Negative for dizziness and headaches.     Lab Results  Component Value Date   NA 135 08/08/2023   K 4.4 08/08/2023   CO2 22 08/08/2023   GLUCOSE 101 (H) 08/08/2023   BUN 58 (H) 08/08/2023   CREATININE 8.04 (H) 08/08/2023   CALCIUM 8.5 (L) 08/08/2023   EGFR 14 (L) 05/19/2022   GFRNONAA 6 (L) 08/08/2023   Lab Results  Component Value Date   CHOL 127 07/19/2023   HDL 49 07/19/2023   LDLCALC 65 07/19/2023   TRIG 59 07/19/2023   CHOLHDL 2.6 07/19/2023   Lab Results  Component Value Date   TSH 6.584 (H) 08/06/2023   Lab Results  Component Value Date   HGBA1C 4.7 (L) 08/06/2023   Lab Results  Component Value Date   WBC 7.5 08/07/2023   HGB 8.1 (L) 08/09/2023   HCT 24.4 (L) 08/09/2023   MCV 97.7 08/07/2023   PLT 122 (L) 08/07/2023   Lab Results  Component Value Date   ALT 26  08/05/2023   AST 39 08/05/2023   ALKPHOS 121 08/05/2023   BILITOT 1.0 08/05/2023   Lab Results  Component Value Date   VD25OH 83 09/06/2022     Patient Active Problem List   Diagnosis Date Noted   Tremor of both hands 09/05/2023   Pruritus due to systemic disorder 09/05/2023   Hypotension 08/07/2023   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   Near syncope 08/05/2023   Acute blood loss anemia (ABLA) 08/05/2023   HFrEF (heart failure with reduced ejection fraction) (HCC) 07/19/2023   Muscle tension headache 11/15/2022   Acquired thrombophilia (HCC) 11/10/2022   ESRD (end stage renal disease) on dialysis (HCC) 09/16/2022   Physical deconditioning 09/16/2022   Bilateral leg edema 09/13/2022   Coronary artery disease of native artery of native heart with stable angina pectoris (HCC) 07/22/2022   OSA (obstructive sleep apnea) 07/22/2022   Chronic systolic CHF (congestive heart failure) (HCC) 07/15/2022   Typical atrial flutter (HCC) 07/15/2022   Acquired hypothyroidism 05/28/2022   GERD without esophagitis 05/28/2022   Essential hypertension 05/05/2022   Prediabetes 04/06/2022   Spinal stenosis of lumbar region without neurogenic claudication 04/06/2022  Weakness of both lower extremities 04/06/2022   Upper airway cough syndrome 10/01/2021   Bursitis of hip 12/18/2018   Paroxysmal atrial fibrillation (HCC) 06/10/2018   Atherosclerosis of native arteries of extremity with intermittent claudication (HCC) 06/25/2016   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   Coronary artery disease involving native coronary artery with angina pectoris (HCC)    Allergic rhinitis 08/19/2015   Anemia 10/04/2014   AA (aortic aneurysm) (HCC) 10/04/2014   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   Basal cell papilloma 10/04/2014   Unspecified osteoarthritis, unspecified site 10/04/2014   Thoracic aortic aneurysm (TAA) (HCC) 10/04/2014   Atherosclerotic heart disease of native coronary artery without  angina pectoris 10/04/2014   Benign prostatic hyperplasia with lower urinary tract symptoms 10/24/2012   DOE (dyspnea on exertion) 11/12/2009   Renal artery stenosis     Mixed hyperlipidemia 09/04/2008   History of redo bypass grafting 09/04/2008    Allergies  Allergen Reactions   Predicort [Prednisolone] Other (See Comments)    Stomach pain   Ciprofloxacin Other (See Comments)    GI upset   Hydrochlorothiazide Other (See Comments)    Dehydration   Hydrocodone Nausea Only and Other (See Comments)    Stomach upset    Hydrocodone-Acetaminophen Nausea Only    Stomach upset   Sulfa Antibiotics Other (See Comments)    Cannot recall   Penicillins Hives, Rash and Other (See Comments)    Has patient had a PCN reaction causing immediate rash, facial/tongue/throat swelling, SOB or lightheadedness with hypotension: YES  Has patient had a PCN reaction causing severe rash involving mucus membranes or skin necrosis: NO  Has patient had a PCN reaction that required hospitalization NO  Has patient had a PCN reaction occurring within the last 10 years:NO  If all of the above answers are "NO", then may proceed with Cephalosporin use.  Has patient had a PCN reaction causing immediate rash, facial/tongue/throat swelling, SOB or lightheadedness with hypotension: YES Has patient had a PCN reaction causing severe rash involving mucus membranes or skin necrosis: NO Has patient had a PCN reaction that required hospitalization NO Has patient had a PCN reaction occurring within the last 10 years:NO If all of the above answers are "NO", then may proceed with Cephalosporin use.    Past Surgical History:  Procedure Laterality Date   CARDIAC CATHETERIZATION  "several"   CARDIAC CATHETERIZATION N/A 12/13/2014   Procedure: Left Heart Cath and Coronary Angiography;  Surgeon: Corky Crafts, MD;  Location: [facility] INVASIVE CV LAB;  Service: Cardiovascular;  Laterality: N/A;   CARDIAC CATHETERIZATION   12/13/2014   Procedure: Coronary Stent Intervention;  Surgeon: Corky Crafts, MD;  Location: [facility] INVASIVE CV LAB;  Service: Cardiovascular;;   CARDIAC CATHETERIZATION N/A 10/07/2015   Procedure: Left Heart Cath and Cors/Grafts Angiography;  Surgeon: Kathleene Hazel, MD;  Location: [facility] INVASIVE CV LAB;  Service: Cardiovascular;  Laterality: N/A;   CARDIAC CATHETERIZATION N/A 10/07/2015   Procedure: Coronary Stent Intervention;  Surgeon: Kathleene Hazel, MD;  Location: [facility] INVASIVE CV LAB;  Service: Cardiovascular;  Laterality: N/A;   CARDIOVERSION N/A 05/25/2022   Procedure: CARDIOVERSION;  Surgeon: Thomasene Ripple, DO;  Location: [facility] ENDOSCOPY;  Service: Cardiovascular;  Laterality: N/A;   CATARACT EXTRACTION W/PHACO Left 10/21/2021   Procedure: CATARACT EXTRACTION PHACO AND INTRAOCULAR LENS PLACEMENT (IOC) LEFT 3.94 00:33.7;  Surgeon: Nevada Crane, MD;  Location: [facility]ERY [facility];  Service: Ophthalmology;  Laterality: Left;   CATARACT EXTRACTION W/PHACO Right 11/09/2021   Procedure: CATARACT EXTRACTION PHACO  AND INTRAOCULAR LENS PLACEMENT (IOC) RIGHT;  Surgeon: Nevada Crane, MD;  Location: [facility] SURGERY [facility];  Service: Ophthalmology;  Laterality: Right;  3.59 0:29.4   CORONARY ANGIOPLASTY  "several"   CORONARY ANGIOPLASTY WITH STENT PLACEMENT  2005; 12/13/2014   "2; 1"   CORONARY ARTERY BYPASS GRAFT  05/31/1994   CABG X5   CORONARY ARTERY BYPASS GRAFT  07/29/2005   CABG X3   DIALYSIS/PERMA CATHETER INSERTION N/A 09/10/2022   Procedure: DIALYSIS/PERMA CATHETER INSERTION;  Surgeon: Renford Dills, MD;  Location: [facility] INVASIVE CV LAB;  Service: Cardiovascular;  Laterality: N/A;   ESOPHAGOGASTRODUODENOSCOPY (EGD) WITH ESOPHAGEAL DILATION  05/31/1998   GREEN LIGHT LASER TURP (TRANSURETHRAL RESECTION OF PROSTATE  01/30/1999   "not cancerous"   HERNIA REPAIR     LAPAROSCOPIC CHOLECYSTECTOMY     LEFT HEART CATH AND CORS/GRAFTS ANGIOGRAPHY N/A 07/28/2017    Procedure: LEFT HEART CATH AND CORS/GRAFTS ANGIOGRAPHY;  Surgeon: Lennette Bihari, MD;  Location: [facility] INVASIVE CV LAB;  Service: Cardiovascular;  Laterality: N/A;   LUNG SURGERY  05/31/1994   "S/P CABG, had to put staple in lung after it had collapsed"   MINOR REMOVAL OF PERITONEAL DIALYSIS CATHETER  10/2022   UMBILICAL HERNIA REPAIR     w/chole    Social History   Tobacco Use   Smoking status: Former    Current packs/day: 0.00    Average packs/day: 3.0 packs/day for 20.0 years (60.0 ttl pk-yrs)    Types: Cigarettes    Start date: 07/25/1966    Quit date: 07/25/1986    Years since quitting: 37.1   Smokeless tobacco: Never  Vaping Use   Vaping status: Never Used  Substance Use Topics   Alcohol use: No    Alcohol/week: 0.0 standard drinks of alcohol   Drug use: No     Medication list has been reviewed and updated.  Current Meds  Medication Sig   acetaminophen (TYLENOL) 650 MG CR tablet Take 1,300 mg by mouth at bedtime.   allopurinol (ZYLOPRIM) 100 MG tablet TAKE 1 TABLET BY MOUTH EVERYDAY AT BEDTIME   atorvastatin (LIPITOR) 40 MG tablet TAKE 1 TABLET BY MOUTH EVERYDAY AT BEDTIME   cetirizine (ZYRTEC) 10 MG chewable tablet Chew 10 mg by mouth daily.   Cholecalciferol (VITAMIN D-3) 125 MCG (5000 UT) TABS Take 2,000 Units by mouth daily.    doxepin (SINEQUAN) 25 MG capsule Take 1 capsule (25 mg total) by mouth at bedtime.   furosemide (LASIX) 40 MG tablet Take 40 mg by mouth daily as needed for edema. Taking 1 1/2 tablets (60mg  total) Pt takes on Tue. Thur. Sat.and Sunday's.   levothyroxine (SYNTHROID) 75 MCG tablet TAKE 1 TABLET BY MOUTH EVERY DAY BEFORE BREAKFAST   midodrine (PROAMATINE) 5 MG tablet Take 1 tablet (5 mg total) by mouth 3 (three) times daily with meals.   Multiple Vitamin (MULTIVITAMIN PO) Take 1 tablet by mouth daily.   nitroGLYCERIN (NITROSTAT) 0.4 MG SL tablet Place 0.4 mg under the tongue every 5 (five) minutes as needed for chest pain (Up to 3 times).    pantoprazole (PROTONIX) 40 MG tablet Take 1 tablet (40 mg total) by mouth 2 (two) times daily. Home med.   senna (SENOKOT) 8.6 MG tablet Take 1 tablet by mouth as needed for constipation.   [DISCONTINUED] diphenhydrAMINE (BENADRYL) 50 MG capsule Take 50 mg by mouth at bedtime.       08/15/2023    1:44 PM 07/26/2023    1:58 PM 07/19/2023  9:37 AM 05/27/2023    1:46 PM  GAD 7 : Generalized Anxiety Score  Nervous, Anxious, on Edge 2 0 0 0  Control/stop worrying 0 0 1 0  Worry too much - different things 0 0 1 0  Trouble relaxing 0 0 1 0  Restless 2 0 1 0  Easily annoyed or irritable 2 0 0 2  Afraid - awful might happen 2 0 0 0  Total GAD 7 Score 8 0 4 2  Anxiety Difficulty  Not difficult at all Not difficult at all Not difficult at all       08/15/2023    1:44 PM 07/26/2023    1:58 PM 07/19/2023    9:36 AM  Depression screen PHQ 2/9  Decreased Interest 0 0 1  Down, Depressed, Hopeless 0 0 0  PHQ - 2 Score 0 0 1  Altered sleeping 0 0 3  Tired, decreased energy 3 0 3  Change in appetite 0 0 0  Feeling bad or failure about yourself  0 0 1  Trouble concentrating 0 0 0  Moving slowly or fidgety/restless 3 0 0  Suicidal thoughts 0 0 0  PHQ-9 Score 6 0 8  Difficult doing work/chores Somewhat difficult Not difficult at all Not difficult at all    BP Readings from Last 3 Encounters:  09/05/23 112/66  08/18/23 128/62  08/15/23 132/68    Physical Exam Vitals and nursing note reviewed.  Constitutional:      General: He is not in acute distress.    Appearance: He is well-developed. He is obese.  HENT:     Head: Normocephalic and atraumatic.  Cardiovascular:     Rate and Rhythm: Normal rate and regular rhythm.  Pulmonary:     Effort: Pulmonary effort is normal. No respiratory distress.     Breath sounds: No wheezing or rhonchi.  Musculoskeletal:     Cervical back: Normal range of motion.     Right lower leg: Edema present.     Left lower leg: Edema present.   Lymphadenopathy:     Cervical: No cervical adenopathy.  Skin:    General: Skin is warm and dry.     Findings: No rash.  Neurological:     Mental Status: He is alert and oriented to person, place, and time.     Motor: Tremor present. No weakness or pronator drift.     Coordination: Coordination normal. Rapid alternating movements normal.     Gait: Gait normal.     Comments: Tremor on intention; no pill rolling no cogwheeling  Psychiatric:        Mood and Affect: Mood normal.        Behavior: Behavior normal.     Wt Readings from Last 3 Encounters:  09/05/23 206 lb (93.4 kg)  08/18/23 208 lb 2 oz (94.4 kg)  08/15/23 208 lb 4 oz (94.5 kg)    BP 112/66   Pulse 99   Ht 5\' 9"  (1.753 m)   Wt 206 lb (93.4 kg)   SpO2 97%   BMI 30.42 kg/m   Assessment and Plan:  Problem List Items Addressed This Visit       Unprioritized   Bilateral leg edema   On lasix 40 mg every other day Unable to elevate or wear compression stockings Recommend working with HD to improve fluid status but may be limited by leg cramps and hypotension      Tremor of both hands (Chronic)   Likely essential tremor  No treatment currently - if worsening would recommend Neurology evaluation      Pruritus due to systemic disorder - Primary (Chronic)   Stop Benadryl Trial of Doxepin nightly - monitor for depression/suicidality      Relevant Medications   doxepin (SINEQUAN) 25 MG capsule    No follow-ups on file.    Reubin Milan, MD [facility] [place]

## 2023-09-05 NOTE — Assessment & Plan Note (Signed)
 On lasix 40 mg every other day Unable to elevate or wear compression stockings Recommend working with HD to improve fluid status but may be limited by leg cramps and hypotension

## 2023-09-05 NOTE — Assessment & Plan Note (Signed)
 Stop Benadryl Trial of Doxepin nightly - monitor for depression/suicidality

## 2023-09-05 NOTE — Telephone Encounter (Signed)
  Chief Complaint: Generalized itching Symptoms: moderate to severe itching-not responding to 24 hour antihistamine or Benadryl Frequency: started a couple of weeks ago Pertinent Negatives: Patient denies CP, SOB Disposition: [] ED /[] Urgent Care (no appt availability in office) / [x] Appointment(In office/virtual)/ []  [employer] [facility]/ [] Home Care/ [] Refused Recommended Disposition /[] [facility]/ []  Follow-up with PCP Additional Notes: patient's wife called with concerns for patient with generalized itching. Wife states itching started a couple of weeks ago. Not responding to 24 hour antihistamine or Benadryl. Patient did have a blood transfusion in the hospital at the end of Feb 2025. Wife is concerned for possible delayed reaction to the blood transfusion. Patient and wife are wanting to be seen today if possible. Per protocol, patient is recommended to be seen in 24 hours. Appointment is available today at 2:20 PM with PCP. Wife and patient accept that appointment and is made for today. Wife and patient verbalized understanding of the plan and all questions answered.    Copied from CRM [id number]. Topic: Clinical - Red Word Triage >> Sep 05, 2023 11:46 AM Florestine Avers wrote: Red Word that prompted transfer to Nurse Triage: Patients wife Rayson Rando called in stating that her husband is having really bad itching. She said the patient stated that it feels like its bugs crawling all over him, both his feet and ankles are swollen/ Patient did have a blood transfusion at the end of Feb. 2025 and the wife is nervous this may be the cause of the sudden issues. Reason for Disposition  [1] MODERATE-SEVERE widespread itching (i.e., interferes with sleep, normal activities or school) AND [2] not improved after 24 hours of itching Care Advice  Answer Assessment - Initial Assessment Questions 1. DESCRIPTION: "Describe the itching you are having."     Generalized itching-feels like bugs are  crawling all over him 2. SEVERITY: "How bad is it?"    - MILD: Doesn't interfere with normal activities.   - MODERATE-SEVERE: Interferes with work, school, sleep, or other activities.      Moderate-severe 3. SCRATCHING: "Are there any scratch marks? Bleeding?"     no 4. ONSET: "When did this begin?"      Started a couple of weeks ago 5. CAUSE: "What do you think is causing the itching?" (ask about swimming pools, pollen, animals, soaps, etc.)     Wife is unsure if itching is from dialysis or if its a delayed reaction from blood transfusion he had in the end of Feb 2025 6. OTHER SYMPTOMS: "Do you have any other symptoms?"      Feet and ankles are swollen, leg cramps  Protocols used: Itching - [facility]

## 2023-09-05 NOTE — Telephone Encounter (Signed)
 Pt has a appt.  KP

## 2023-09-07 DIAGNOSIS — Z23 Encounter for immunization: Secondary | ICD-10-CM | POA: Diagnosis not present

## 2023-09-07 DIAGNOSIS — N186 End stage renal disease: Secondary | ICD-10-CM | POA: Diagnosis not present

## 2023-09-07 DIAGNOSIS — Z992 Dependence on renal dialysis: Secondary | ICD-10-CM | POA: Diagnosis not present

## 2023-09-09 DIAGNOSIS — Z992 Dependence on renal dialysis: Secondary | ICD-10-CM | POA: Diagnosis not present

## 2023-09-09 DIAGNOSIS — Z23 Encounter for immunization: Secondary | ICD-10-CM | POA: Diagnosis not present

## 2023-09-09 DIAGNOSIS — N186 End stage renal disease: Secondary | ICD-10-CM | POA: Diagnosis not present

## 2023-09-12 DIAGNOSIS — Z992 Dependence on renal dialysis: Secondary | ICD-10-CM | POA: Diagnosis not present

## 2023-09-12 DIAGNOSIS — Z23 Encounter for immunization: Secondary | ICD-10-CM | POA: Diagnosis not present

## 2023-09-12 DIAGNOSIS — N186 End stage renal disease: Secondary | ICD-10-CM | POA: Diagnosis not present

## 2023-09-14 DIAGNOSIS — Z23 Encounter for immunization: Secondary | ICD-10-CM | POA: Diagnosis not present

## 2023-09-14 DIAGNOSIS — N186 End stage renal disease: Secondary | ICD-10-CM | POA: Diagnosis not present

## 2023-09-14 DIAGNOSIS — Z992 Dependence on renal dialysis: Secondary | ICD-10-CM | POA: Diagnosis not present

## 2023-09-16 DIAGNOSIS — Z23 Encounter for immunization: Secondary | ICD-10-CM | POA: Diagnosis not present

## 2023-09-16 DIAGNOSIS — N186 End stage renal disease: Secondary | ICD-10-CM | POA: Diagnosis not present

## 2023-09-16 DIAGNOSIS — Z992 Dependence on renal dialysis: Secondary | ICD-10-CM | POA: Diagnosis not present

## 2023-09-19 DIAGNOSIS — N186 End stage renal disease: Secondary | ICD-10-CM | POA: Diagnosis not present

## 2023-09-19 DIAGNOSIS — Z23 Encounter for immunization: Secondary | ICD-10-CM | POA: Diagnosis not present

## 2023-09-19 DIAGNOSIS — Z992 Dependence on renal dialysis: Secondary | ICD-10-CM | POA: Diagnosis not present

## 2023-09-21 DIAGNOSIS — Z23 Encounter for immunization: Secondary | ICD-10-CM | POA: Diagnosis not present

## 2023-09-21 DIAGNOSIS — N186 End stage renal disease: Secondary | ICD-10-CM | POA: Diagnosis not present

## 2023-09-21 DIAGNOSIS — Z992 Dependence on renal dialysis: Secondary | ICD-10-CM | POA: Diagnosis not present

## 2023-09-23 DIAGNOSIS — Z992 Dependence on renal dialysis: Secondary | ICD-10-CM | POA: Diagnosis not present

## 2023-09-23 DIAGNOSIS — Z23 Encounter for immunization: Secondary | ICD-10-CM | POA: Diagnosis not present

## 2023-09-23 DIAGNOSIS — N186 End stage renal disease: Secondary | ICD-10-CM | POA: Diagnosis not present

## 2023-09-26 DIAGNOSIS — N186 End stage renal disease: Secondary | ICD-10-CM | POA: Diagnosis not present

## 2023-09-26 DIAGNOSIS — Z992 Dependence on renal dialysis: Secondary | ICD-10-CM | POA: Diagnosis not present

## 2023-09-26 DIAGNOSIS — Z23 Encounter for immunization: Secondary | ICD-10-CM | POA: Diagnosis not present

## 2023-09-27 ENCOUNTER — Other Ambulatory Visit: Payer: Self-pay | Admitting: Internal Medicine

## 2023-09-27 DIAGNOSIS — L2989 Other pruritus: Secondary | ICD-10-CM

## 2023-09-28 DIAGNOSIS — N186 End stage renal disease: Secondary | ICD-10-CM | POA: Diagnosis not present

## 2023-09-28 DIAGNOSIS — Z992 Dependence on renal dialysis: Secondary | ICD-10-CM | POA: Diagnosis not present

## 2023-09-28 DIAGNOSIS — Z23 Encounter for immunization: Secondary | ICD-10-CM | POA: Diagnosis not present

## 2023-09-29 ENCOUNTER — Other Ambulatory Visit: Payer: Self-pay

## 2023-09-29 ENCOUNTER — Other Ambulatory Visit: Payer: Self-pay | Admitting: Internal Medicine

## 2023-09-29 ENCOUNTER — Observation Stay
Admission: EM | Admit: 2023-09-29 | Discharge: 2023-10-01 | Disposition: A | Discharge status: Home / Self Care | Attending: Internal Medicine | Admitting: Internal Medicine

## 2023-09-29 ENCOUNTER — Emergency Department

## 2023-09-29 DIAGNOSIS — I480 Paroxysmal atrial fibrillation: Secondary | ICD-10-CM | POA: Diagnosis present

## 2023-09-29 DIAGNOSIS — Z87891 Personal history of nicotine dependence: Secondary | ICD-10-CM | POA: Insufficient documentation

## 2023-09-29 DIAGNOSIS — I482 Chronic atrial fibrillation, unspecified: Secondary | ICD-10-CM | POA: Insufficient documentation

## 2023-09-29 DIAGNOSIS — E785 Hyperlipidemia, unspecified: Secondary | ICD-10-CM | POA: Insufficient documentation

## 2023-09-29 DIAGNOSIS — Z79899 Other long term (current) drug therapy: Secondary | ICD-10-CM | POA: Diagnosis not present

## 2023-09-29 DIAGNOSIS — I6523 Occlusion and stenosis of bilateral carotid arteries: Secondary | ICD-10-CM | POA: Diagnosis not present

## 2023-09-29 DIAGNOSIS — I5042 Chronic combined systolic (congestive) and diastolic (congestive) heart failure: Secondary | ICD-10-CM | POA: Insufficient documentation

## 2023-09-29 DIAGNOSIS — I479 Paroxysmal tachycardia, unspecified: Secondary | ICD-10-CM | POA: Diagnosis not present

## 2023-09-29 DIAGNOSIS — R6889 Other general symptoms and signs: Secondary | ICD-10-CM | POA: Diagnosis not present

## 2023-09-29 DIAGNOSIS — I2510 Atherosclerotic heart disease of native coronary artery without angina pectoris: Secondary | ICD-10-CM | POA: Insufficient documentation

## 2023-09-29 DIAGNOSIS — Z992 Dependence on renal dialysis: Secondary | ICD-10-CM | POA: Insufficient documentation

## 2023-09-29 DIAGNOSIS — L2989 Other pruritus: Secondary | ICD-10-CM

## 2023-09-29 DIAGNOSIS — R55 Syncope and collapse: Secondary | ICD-10-CM | POA: Diagnosis not present

## 2023-09-29 DIAGNOSIS — M109 Gout, unspecified: Secondary | ICD-10-CM | POA: Diagnosis not present

## 2023-09-29 DIAGNOSIS — E1122 Type 2 diabetes mellitus with diabetic chronic kidney disease: Secondary | ICD-10-CM | POA: Insufficient documentation

## 2023-09-29 DIAGNOSIS — T148XXA Other injury of unspecified body region, initial encounter: Secondary | ICD-10-CM

## 2023-09-29 DIAGNOSIS — R404 Transient alteration of awareness: Secondary | ICD-10-CM | POA: Diagnosis not present

## 2023-09-29 DIAGNOSIS — K219 Gastro-esophageal reflux disease without esophagitis: Secondary | ICD-10-CM | POA: Diagnosis present

## 2023-09-29 DIAGNOSIS — R000 Tachycardia, unspecified: Principal | ICD-10-CM

## 2023-09-29 DIAGNOSIS — I4729 Other ventricular tachycardia: Secondary | ICD-10-CM | POA: Diagnosis present

## 2023-09-29 DIAGNOSIS — Z743 Need for continuous supervision: Secondary | ICD-10-CM | POA: Diagnosis not present

## 2023-09-29 DIAGNOSIS — Z951 Presence of aortocoronary bypass graft: Secondary | ICD-10-CM | POA: Diagnosis not present

## 2023-09-29 DIAGNOSIS — I132 Hypertensive heart and chronic kidney disease with heart failure and with stage 5 chronic kidney disease, or end stage renal disease: Secondary | ICD-10-CM | POA: Insufficient documentation

## 2023-09-29 DIAGNOSIS — E039 Hypothyroidism, unspecified: Secondary | ICD-10-CM | POA: Diagnosis not present

## 2023-09-29 DIAGNOSIS — M47812 Spondylosis without myelopathy or radiculopathy, cervical region: Secondary | ICD-10-CM | POA: Diagnosis not present

## 2023-09-29 DIAGNOSIS — N186 End stage renal disease: Secondary | ICD-10-CM | POA: Diagnosis not present

## 2023-09-29 DIAGNOSIS — R42 Dizziness and giddiness: Secondary | ICD-10-CM | POA: Diagnosis not present

## 2023-09-29 DIAGNOSIS — J449 Chronic obstructive pulmonary disease, unspecified: Secondary | ICD-10-CM | POA: Insufficient documentation

## 2023-09-29 DIAGNOSIS — I499 Cardiac arrhythmia, unspecified: Secondary | ICD-10-CM | POA: Diagnosis not present

## 2023-09-29 DIAGNOSIS — Z955 Presence of coronary angioplasty implant and graft: Secondary | ICD-10-CM | POA: Insufficient documentation

## 2023-09-29 DIAGNOSIS — R58 Hemorrhage, not elsewhere classified: Secondary | ICD-10-CM | POA: Diagnosis not present

## 2023-09-29 LAB — BASIC METABOLIC PANEL WITH GFR
Anion gap: 16 — ABNORMAL HIGH (ref 5–15)
BUN: 35 mg/dL — ABNORMAL HIGH (ref 8–23)
CO2: 21 mmol/L — ABNORMAL LOW (ref 22–32)
Calcium: 9.2 mg/dL (ref 8.9–10.3)
Chloride: 96 mmol/L — ABNORMAL LOW (ref 98–111)
Creatinine, Ser: 5.9 mg/dL — ABNORMAL HIGH (ref 0.61–1.24)
GFR, Estimated: 9 mL/min — ABNORMAL LOW (ref >60)
Glucose, Bld: 116 mg/dL — ABNORMAL HIGH (ref 70–99)
Potassium: 6 mmol/L — ABNORMAL HIGH (ref 3.5–5.1)
Sodium: 133 mmol/L — ABNORMAL LOW (ref 135–145)

## 2023-09-29 LAB — CBC WITH DIFFERENTIAL/PLATELET
Abs Immature Granulocytes: 0.02 10*3/uL (ref 0.00–0.07)
Basophils Absolute: 0.1 10*3/uL (ref 0.0–0.1)
Basophils Relative: 1 %
Eosinophils Absolute: 0.1 10*3/uL (ref 0.0–0.5)
Eosinophils Relative: 2 %
HCT: 32.4 % — ABNORMAL LOW (ref 39.0–52.0)
Hemoglobin: 10.3 g/dL — ABNORMAL LOW (ref 13.0–17.0)
Immature Granulocytes: 0 %
Lymphocytes Relative: 11 %
Lymphs Abs: 0.7 10*3/uL (ref 0.7–4.0)
MCH: 31.3 pg (ref 26.0–34.0)
MCHC: 31.8 g/dL (ref 30.0–36.0)
MCV: 98.5 fL (ref 80.0–100.0)
Monocytes Absolute: 0.7 10*3/uL (ref 0.1–1.0)
Monocytes Relative: 10 %
Neutro Abs: 4.8 10*3/uL (ref 1.7–7.7)
Neutrophils Relative %: 76 %
Platelets: 140 10*3/uL — ABNORMAL LOW (ref 150–400)
RBC: 3.29 MIL/uL — ABNORMAL LOW (ref 4.22–5.81)
RDW: 15.6 % — ABNORMAL HIGH (ref 11.5–15.5)
WBC: 6.3 10*3/uL (ref 4.0–10.5)
nRBC: 0 % (ref 0.0–0.2)

## 2023-09-29 LAB — TROPONIN I (HIGH SENSITIVITY)
Troponin I (High Sensitivity): 27 ng/L — ABNORMAL HIGH (ref <18)
Troponin I (High Sensitivity): 63 ng/L — ABNORMAL HIGH (ref <18)

## 2023-09-29 LAB — MAGNESIUM: Magnesium: 2.6 mg/dL — ABNORMAL HIGH (ref 1.7–2.4)

## 2023-09-29 LAB — CBG MONITORING, ED: Glucose-Capillary: 112 mg/dL — ABNORMAL HIGH (ref 70–99)

## 2023-09-29 MED ORDER — SENNA 8.6 MG PO TABS: 1.0000 | ORAL_TABLET | ORAL | Status: DC | PRN

## 2023-09-29 MED ORDER — AMIODARONE IV BOLUS ONLY 150 MG/100ML
150.0000 mg | Freq: Once | INTRAVENOUS | Status: AC
  Administered 2023-09-29: 150 mg via INTRAVENOUS

## 2023-09-29 MED ORDER — MIDODRINE HCL 5 MG PO TABS
5.0000 mg | ORAL_TABLET | Freq: Three times a day (TID) | ORAL | Status: DC
  Administered 2023-09-30 – 2023-10-01 (×4): 5 mg via ORAL
  Filled 2023-09-29 (×4): qty 1

## 2023-09-29 MED ORDER — PANTOPRAZOLE SODIUM 40 MG PO TBEC
40.0000 mg | DELAYED_RELEASE_TABLET | Freq: Two times a day (BID) | ORAL | Status: DC
  Administered 2023-09-29 – 2023-10-01 (×4): 40 mg via ORAL
  Filled 2023-09-29 (×4): qty 1

## 2023-09-29 MED ORDER — ONDANSETRON HCL 4 MG/2ML IJ SOLN: 4.0000 mg | Freq: Four times a day (QID) | INTRAMUSCULAR | Status: DC | PRN

## 2023-09-29 MED ORDER — TRAZODONE HCL 50 MG PO TABS: 25.0000 mg | ORAL_TABLET | Freq: Every evening | ORAL | Status: DC | PRN

## 2023-09-29 MED ORDER — GABAPENTIN 100 MG PO CAPS: 100.0000 mg | ORAL_CAPSULE | Freq: Two times a day (BID) | ORAL | Status: DC | PRN

## 2023-09-29 MED ORDER — ACETAMINOPHEN 650 MG RE SUPP: 650.0000 mg | Freq: Four times a day (QID) | RECTAL | Status: DC | PRN

## 2023-09-29 MED ORDER — SODIUM ZIRCONIUM CYCLOSILICATE 10 G PO PACK
10.0000 g | PACK | Freq: Once | ORAL | Status: AC
  Administered 2023-09-29: 10 g via ORAL
  Filled 2023-09-29: qty 1

## 2023-09-29 MED ORDER — DOXEPIN HCL 25 MG PO CAPS
25.0000 mg | ORAL_CAPSULE | Freq: Every day | ORAL | Status: DC
  Administered 2023-09-29 – 2023-09-30 (×2): 25 mg via ORAL
  Filled 2023-09-29 (×2): qty 1

## 2023-09-29 MED ORDER — ATORVASTATIN CALCIUM 20 MG PO TABS
40.0000 mg | ORAL_TABLET | Freq: Every day | ORAL | Status: DC
  Administered 2023-09-29 – 2023-09-30 (×2): 40 mg via ORAL
  Filled 2023-09-29 (×2): qty 2

## 2023-09-29 MED ORDER — AMIODARONE IV BOLUS ONLY 150 MG/100ML
150.0000 mg | Freq: Once | INTRAVENOUS | Status: DC
  Filled 2023-09-29: qty 100

## 2023-09-29 MED ORDER — CALCIUM GLUCONATE 10 % IV SOLN
1.0000 g | Freq: Once | INTRAVENOUS | Status: AC
  Administered 2023-09-29: 1 g via INTRAVENOUS
  Filled 2023-09-29: qty 10

## 2023-09-29 MED ORDER — FUROSEMIDE 40 MG PO TABS: 40.0000 mg | ORAL_TABLET | Freq: Every day | ORAL | Status: DC | PRN

## 2023-09-29 MED ORDER — ACETAMINOPHEN 325 MG PO TABS: 650.0000 mg | ORAL_TABLET | Freq: Four times a day (QID) | ORAL | Status: DC | PRN

## 2023-09-29 MED ORDER — INSULIN ASPART 100 UNIT/ML IJ SOLN
10.0000 [IU] | Freq: Once | INTRAMUSCULAR | Status: AC
  Administered 2023-09-29: 10 [IU] via INTRAVENOUS
  Filled 2023-09-29: qty 1

## 2023-09-29 MED ORDER — DEXTROSE 50 % IV SOLN
1.0000 | Freq: Once | INTRAVENOUS | Status: AC
  Administered 2023-09-29: 50 mL via INTRAVENOUS
  Filled 2023-09-29: qty 50

## 2023-09-29 MED ORDER — AMIODARONE IV BOLUS ONLY 150 MG/100ML
INTRAVENOUS | Status: AC
  Filled 2023-09-29: qty 100

## 2023-09-29 MED ORDER — LEVOTHYROXINE SODIUM 50 MCG PO TABS
75.0000 ug | ORAL_TABLET | Freq: Every day | ORAL | Status: DC
  Administered 2023-09-30 – 2023-10-01 (×2): 75 ug via ORAL
  Filled 2023-09-29: qty 1
  Filled 2023-09-29: qty 2

## 2023-09-29 MED ORDER — ONDANSETRON HCL 4 MG PO TABS: 4.0000 mg | ORAL_TABLET | Freq: Four times a day (QID) | ORAL | Status: DC | PRN

## 2023-09-29 MED ORDER — ADULT MULTIVITAMIN W/MINERALS CH
1.0000 | ORAL_TABLET | Freq: Every day | ORAL | Status: DC
  Administered 2023-09-30 – 2023-10-01 (×2): 1 via ORAL
  Filled 2023-09-29 (×2): qty 1

## 2023-09-29 MED ORDER — LORATADINE 10 MG PO TABS
10.0000 mg | ORAL_TABLET | Freq: Every day | ORAL | Status: DC
  Administered 2023-09-30 – 2023-10-01 (×2): 10 mg via ORAL
  Filled 2023-09-29 (×2): qty 1

## 2023-09-29 MED ORDER — ALLOPURINOL 100 MG PO TABS
100.0000 mg | ORAL_TABLET | Freq: Every day | ORAL | Status: DC
  Administered 2023-09-29 – 2023-09-30 (×2): 100 mg via ORAL
  Filled 2023-09-29 (×2): qty 1

## 2023-09-29 MED ORDER — SODIUM CHLORIDE 0.9 % IV BOLUS
1000.0000 mL | Freq: Once | INTRAVENOUS | Status: AC
  Administered 2023-09-29: 1000 mL via INTRAVENOUS

## 2023-09-29 MED ORDER — MAGNESIUM HYDROXIDE 400 MG/5ML PO SUSP: 30.0000 mL | Freq: Every day | ORAL | Status: DC | PRN

## 2023-09-29 MED ORDER — NITROGLYCERIN 0.4 MG SL SUBL: 0.4000 mg | SUBLINGUAL_TABLET | SUBLINGUAL | Status: DC | PRN

## 2023-09-29 MED ORDER — ENOXAPARIN SODIUM 40 MG/0.4ML IJ SOSY: 40.0000 mg | PREFILLED_SYRINGE | INTRAMUSCULAR | Status: DC

## 2023-09-29 MED ORDER — VITAMIN D3 25 MCG (1000 UNIT) PO TABS
2000.0000 [IU] | ORAL_TABLET | Freq: Every day | ORAL | Status: DC
  Administered 2023-09-30 – 2023-10-01 (×2): 2000 [IU] via ORAL
  Filled 2023-09-29 (×4): qty 2

## 2023-09-29 NOTE — Assessment & Plan Note (Signed)
Will continue allopurinol. 

## 2023-09-29 NOTE — Assessment & Plan Note (Signed)
-   It is now with controlled ventricular response after amiodarone . - Troponin was 27 later 63. - Will place on IV heparin . - Will obtain a 2D echo. - Will defer further management to the cardiologist.

## 2023-09-29 NOTE — Telephone Encounter (Signed)
 Please review

## 2023-09-29 NOTE — ED Provider Notes (Signed)
 [facility] Provider Note    Event Date/Time   First MD Initiated Contact with Patient 09/29/23 1832     (approximate)   History   Near Syncope   HPI  RONNE MEZGER is a 78 y.o. male  who presents to the emergency department today because of concern for lightheadedness and fall. The patient had been outside doing yard work. He says that when he came in side he started feeling lightheaded. Fell backwards and hit his head. Never blacked out or lost consciousness. Also injured his right forearm. States he has fallen backwards in the past. Denies any chest pain or palpitations.     Physical Exam   Triage Vital Signs: ED Triage Vitals  Encounter Vitals Group     BP 09/29/23 1830 (!) 83/54     Systolic BP Percentile --      Diastolic BP Percentile --      Pulse Rate 09/29/23 1830 (!) 167     Resp 09/29/23 1830 16     Temp --      Temp src --      SpO2 09/29/23 1830 94 %     Weight --      Height --      Head Circumference --      Peak Flow --      Pain Score 09/29/23 1831 0     Pain Loc --      Pain Education --      Exclude from Growth Chart --     Most recent vital signs: Vitals:   09/29/23 1835 09/29/23 1851  BP:  (!) 105/57  Pulse: (!) 168 (!) 56  Resp: (!) 27 18  SpO2: 100% 100%   General: Awake, alert, oriented. CV:  Good peripheral perfusion. Tachycardia Resp:  Normal effort. Lungs clear. Abd:  No distention.  Other:  Hematoma to scalp with small abrasion. Skin tears to right forearm.    ED Results / Procedures / Treatments   Labs (all labs ordered are listed, but only abnormal results are displayed) Labs Reviewed  CBC WITH DIFFERENTIAL/PLATELET - Abnormal; Notable for the following components:      Result Value   RBC 3.29 (*)    Hemoglobin 10.3 (*)    HCT 32.4 (*)    RDW 15.6 (*)    Platelets 140 (*)    All other components within normal limits  BASIC METABOLIC PANEL WITH GFR - Abnormal; Notable for the following  components:   Sodium 133 (*)    Potassium 6.0 (*)    Chloride 96 (*)    CO2 21 (*)    Glucose, Bld 116 (*)    BUN 35 (*)    Creatinine, Ser 5.90 (*)    GFR, Estimated 9 (*)    Anion gap 16 (*)    All other components within normal limits  MAGNESIUM  - Abnormal; Notable for the following components:   Magnesium  2.6 (*)    All other components within normal limits  CBG MONITORING, ED - Abnormal; Notable for the following components:   Glucose-Capillary 112 (*)    All other components within normal limits  TROPONIN I (HIGH SENSITIVITY) - Abnormal; Notable for the following components:   Troponin I (High Sensitivity) 27 (*)    All other components within normal limits  TROPONIN I (HIGH SENSITIVITY)     EKG  I, Marylynn Soho, attending physician, personally viewed and interpreted this EKG  EKG Time: 1831 Rate: 167  Rhythm: wide complex tachycardia Axis: right axis deviation Intervals: qtc 509 QRS: Wide ST changes: no st elevation Impression: abnormal ekg  I, Marylynn Soho, attending physician, personally viewed and interpreted this EKG  EKG Time: 1849 Rate: 117 Rhythm: sinus tachycardia with bigeminy Axis: right axis deviation Intervals: qtc 434 QRS: IVCD ST changes: no st elevation Impression: abnormal ekg  I, Marylynn Soho, attending physician, personally viewed and interpreted this EKG  EKG Time: 1931 Rate: 110 Rhythm: atrial fibrillation Axis: right axis deviation Intervals: qtc 513 QRS: IVCD, q waves v1 ST changes: no st elevation Impression: abnor mal ekg   RADIOLOGY I independently interpreted and visualized the CT head. My interpretation: No ICH Radiology interpretation:  IMPRESSION:  1. Scattered hypodensities within the bilateral frontal  periventricular white matter, increased since prior exam, consistent  with age-indeterminate small vessel ischemic changes.  2. No evidence of acute hemorrhage.   I independently interpreted and  visualized the CT cervical spine. My interpretation: No fracture Radiology interpretation:  IMPRESSION:  1. No acute cervical spine fracture.  2. Stable cervical degenerative changes.        PROCEDURES:  Critical Care performed: Yes  CRITICAL CARE Performed by: Marylynn Soho   Total critical care time: 35 minutes  Critical care time was exclusive of separately billable procedures and treating other patients.  Critical care was necessary to treat or prevent imminent or life-threatening deterioration.  Critical care was time spent personally by me on the following activities: development of treatment plan with patient and/or surrogate as well as nursing, discussions with consultants, evaluation of patient's response to treatment, examination of patient, obtaining history from patient or surrogate, ordering and performing treatments and interventions, ordering and review of laboratory studies, ordering and review of radiographic studies, pulse oximetry and re-evaluation of patient's condition.   Procedures    MEDICATIONS ORDERED IN ED: Medications  sodium chloride  0.9 % bolus 1,000 mL (1,000 mLs Intravenous New Bag/Given 09/29/23 1836)  amiodarone  (NEXTERONE ) 1.5 mg/mL IV bolus only 150 mg ( Intravenous Not Given 09/29/23 1839)     IMPRESSION / MDM / ASSESSMENT AND PLAN / ED COURSE  I reviewed the triage vital signs and the nursing notes.                              Differential diagnosis includes, but is not limited to, electrolyte abnormality, arrhythmia, ACS  Patient's presentation is most consistent with acute presentation with potential threat to life or bodily function.   The patient is on the cardiac monitor to evaluate for evidence of arrhythmia and/or significant heart rate changes.  Patient presented to the emergency department today because concerns for lightheadedness EMS did note patient had wide-complex tachycardia during transport however they were unable  to establish did not give patient arrival to the emergency EKG was performed which did show a wide-complex tachycardia.  However patient does awake alert oriented.  Concerning symptoms.  Given that patient seemed stable initially try amiodarone .  This did help with patient's heart rate when symptomatic bigeminal pattern.  Patient's blood pressure did currently.  Will check blood work including troponin.   Blood work showed elevated potassium. Discussed with Dr. Rhesa Celeste with nephrology. Patient was given lokelma , insulin  and calcium . Slight troponin elevation in blood work as well, however I do have low concern for ACS given lack of chest pain. Discussed with Dr. Achilles Holes with the hospitalist service who will evaluate for admission.  FINAL CLINICAL IMPRESSION(S) / ED DIAGNOSES   Final diagnoses:  Wide-complex tachycardia  Near syncope  Hematoma     Note:  This document was prepared using Dragon voice recognition software and may include unintentional dictation errors.    Marylynn Soho, MD 09/29/23 2126

## 2023-09-29 NOTE — Assessment & Plan Note (Signed)
-   Will continue Synthroid and check TSH.

## 2023-09-29 NOTE — Telephone Encounter (Signed)
 Requested medication (s) are due for refill today: Yes  Requested medication (s) are on the active medication list: Yes  Last refill:  09/05/23 #30, 0 refills   Future visit scheduled: No  Notes to clinic:  Unsure if refill is appropriate, trial 30 day supply given     Requested Prescriptions  Pending Prescriptions Disp Refills   doxepin  (SINEQUAN ) 25 MG capsule [Pharmacy Med Name: DOXEPIN  25 MG CAPSULE] 90 capsule 1    Sig: TAKE 1 CAPSULE BY MOUTH AT BEDTIME.     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[facility])   [place] [facility] at [facility], [place]ales [place], MD   2 months ago Annual physical exam   [facility] at [facility], [place], MD             Psychiatry:  Antidepressants - Heterocyclics (TCAs) - doxepin  Passed - 09/29/2023  2:57 PM      Passed - Valid encounter within last 12 months    Recent Outpatient Visits           3 weeks ago Pruritus due to systemic disorder   [place] [facility] at [facility], [place]ales [place], MD   1 month ago Iron deficiency anemia due to chronic blood loss   [facility] at [facility] [place], MD   2 months ago 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

## 2023-09-29 NOTE — Assessment & Plan Note (Addendum)
-  This is associated with near syncope. - Patient was admitted to a progressive unit bed. - Will monitor electrolytes. - Cardiology consult will be obtained. - We will obtain TSH.

## 2023-09-29 NOTE — H&P (Addendum)
 Ronald [name]   PATIENT NAMEYaw [name]    MR#:  [id number]  DATE OF BIRTH:  1946-04-26  DATE OF ADMISSION:  09/29/2023  PRIMARY CARE PHYSICIAN: Sheron Dixons, MD   Patient is coming from: Home  REQUESTING/REFERRING PHYSICIAN: Everlene Hobby, MD  CHIEF COMPLAINT:   Chief Complaint  Patient presents with   Near Syncope    HISTORY OF PRESENT ILLNESS:  Ronald [name] is a 78 y.o. Caucasian male with medical history significant for coronary artery disease status post CABG X2, chronic combined systolic and diastolic CHF, ESRD GERD, COPD, type 2 diabetes mellitus and CAD who presented to the emergency room with acute onset of lightheadedness with subsequent fall and head injury without loss of consciousness.  The patient denies any paresthesias or focal muscle weakness.  He has been doing outside yard work.  He felt lightheaded when he came in and fell backwards hitting his head.  He also injured his right forearm.  He experienced palpitations with denies any chest pain.  He admitted to associated dyspnea without cough or wheezing.  No nausea or vomiting or abdominal pain.  No dysuria, oliguria or hematuria or flank pain.  No bleeding diathesis.  ED Course: When the patient came to the ER, heart rate was 168 with wide-complex tachycardia respiratory to 27 with a BP of 105/57.  Labs revealed potassium of 6 with sodium 133 and chloride of 96, CO2 of 21 and BUN 35 with creatinine 5.9 anion gap 16 with magnesium  of 2.6.  High sensitive troponin I was 27 and later 63.  CBC showed anemia better than previous levels with hemoglobin 10.3 hematocrit 32.4 with mild thrombocytopenia with platelets of 140. EKG as reviewed by me : Initial EKG showed wide-complex tachycardia with a rate of 167 with right bundle branch block.  Later rate went down to 117 with suspected ventricular bigeminy and underlying sinus tachycardia after amiodarone .  EKG showed atrial fibrillation with rapid ventricular  sponsor 110 with nonspecific intraventricular conduction delay.  Most recent 2D echo on 03/15/2023 revealed EF of 20 to 25% with moderate dilated left ventricle and left atrium, moderate mitral valve regurgitation and moderate tricuspid valve regurgitation. Imaging: Noncontrast head CT scan revealed the following: 1. Scattered hypodensities within the bilateral frontal periventricular white matter, increased since prior exam, consistent with age-indeterminate small vessel ischemic changes. 2. No evidence of acute hemorrhage. C-spine CT showed the following: 1. No acute cervical spine fracture. 2. Stable cervical degenerative changes.   The patient was given 1 L bolus of IV normal saline, D50/insulin , a gram of IV calcium  gluconate and a bolus of 150 mg of IV amiodarone  after which the rate slowed down as mentioned above.  PAST MEDICAL HISTORY:   Past Medical History:  Diagnosis Date   AAA (abdominal aortic aneurysm) (HCC)    a. 3cm by US  2015; b. 07/2023 Ao U/S: Abd Ao 2.6cm.   Arthritis    "hips; back" (12/13/2014)   CAD (coronary artery disease)    a. 1996 s/p CABGx4: LIMA-LAD, VG-Cx, VG-RCA, VG-diag; b. 2007 s/p redo CABGx2: VG-OM, VG-RCA due to VG disease; c. NSTEMI 11/2014 s/p DES to SVG-OM from the Y graft; d. 09/2015 PCI: distal body of SVG-Diag; e. 07/2017 Cath: LAD 100p, LCX 100p, RCA 95ost/100p, VG->dRCA 10p ISR, 30p/m/d, Y graft->OM3 (ok) & D2 60m, patent distal stent, LIMA->LAD ok, VG->OM2 100, VG->RPAV 100-->Med rx.   Chronic combined systolic and diastolic CHF (congestive heart failure) (HCC)  a. remote EF 40-45% in 2006. b. Normal EF 2014. c. Echo 07/2016 EF 45-50%, grade 1 DD. d. Echo 2020 30% to 35%; e. 03/2023 Echo: EF 20-25%, glob HK, inf wall best preserved. Mildly reduced RV fxn, RVSP 39.74mmHg. Mod dil LA. Mod MR/TR. AoV sclerosis.   Chronic lower back pain    CKD (chronic kidney disease), stage IV (HCC)    COPD (chronic obstructive pulmonary disease) (HCC)    Deafness  in left ear    Degenerative disc disease, lumbar    Diabetes mellitus, type 2 (HCC) 10/04/2014   Microalbumin 05/11/2012-100. Foot exam/monofilament 05/11/2012-normal.   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 CHA2DS2VASc = 6-->eliquis .  On Amio.   PVC's (premature ventricular contractions)    Renal artery stenosis (HCC)    a. noted on CT 2008.   Type II diabetes mellitus (HCC)    Diet control    Unstable angina (HCC) 07/27/2017   Walking pneumonia 1990's   Wears dentures    full upper    PAST SURGICAL HISTORY:   Past Surgical History:  Procedure Laterality Date   CARDIAC CATHETERIZATION  "several"   CARDIAC CATHETERIZATION N/A 12/13/2014   Procedure: Left Heart Cath and Coronary Angiography;  Surgeon: Lucendia Rusk, MD;  Location: [facility] INVASIVE CV LAB;  Service: Cardiovascular;  Laterality: N/A;   CARDIAC CATHETERIZATION  12/13/2014   Procedure: Coronary Stent Intervention;  Surgeon: Lucendia Rusk, MD;  Location: [facility] INVASIVE CV LAB;  Service: Cardiovascular;;   CARDIAC CATHETERIZATION N/A 10/07/2015   Procedure: Left Heart Cath and Cors/Grafts Angiography;  Surgeon: Odie Benne, MD;  Location: [facility] INVASIVE CV LAB;  Service: Cardiovascular;  Laterality: N/A;   CARDIAC CATHETERIZATION N/A 10/07/2015   Procedure: Coronary Stent Intervention;  Surgeon: Odie Benne, MD;  Location: [facility] INVASIVE CV LAB;  Service: Cardiovascular;   Laterality: N/A;   CARDIOVERSION N/A 05/25/2022   Procedure: CARDIOVERSION;  Surgeon: Jerryl Morin, DO;  Location: [facility] ENDOSCOPY;  Service: Cardiovascular;  Laterality: N/A;   CATARACT EXTRACTION W/PHACO Left 10/21/2021   Procedure: CATARACT EXTRACTION PHACO AND INTRAOCULAR LENS PLACEMENT (IOC) LEFT 3.94 00:33.7;  Surgeon: Rosa College, MD;  Location: [facility] SURGERY CNTR;  Service: Ophthalmology;  Laterality: Left;   CATARACT EXTRACTION W/PHACO Right 11/09/2021   Procedure: CATARACT EXTRACTION PHACO AND INTRAOCULAR LENS PLACEMENT (IOC) RIGHT;  Surgeon: Rosa College, MD;  Location: [facility] SURGERY CNTR;  Service: Ophthalmology;  Laterality: Right;  3.59 0:29.4   CORONARY ANGIOPLASTY  "several"   CORONARY ANGIOPLASTY WITH STENT PLACEMENT  2005; 12/13/2014   "2; 1"   CORONARY ARTERY BYPASS GRAFT  05/31/1994   CABG X5   CORONARY ARTERY BYPASS GRAFT  07/29/2005   CABG X3   DIALYSIS/PERMA CATHETER INSERTION N/A 09/10/2022   Procedure: DIALYSIS/PERMA CATHETER INSERTION;  Surgeon: Jackquelyn Mass, MD;  Location: [facility] INVASIVE CV LAB;  Service: Cardiovascular;  Laterality: N/A;   ESOPHAGOGASTRODUODENOSCOPY (EGD) WITH ESOPHAGEAL DILATION  05/31/1998  GREEN LIGHT LASER TURP (TRANSURETHRAL RESECTION OF PROSTATE  01/30/1999   "not cancerous"   HERNIA REPAIR     LAPAROSCOPIC CHOLECYSTECTOMY     LEFT HEART CATH AND CORS/GRAFTS ANGIOGRAPHY N/A 07/28/2017   Procedure: LEFT HEART CATH AND CORS/GRAFTS ANGIOGRAPHY;  Surgeon: Millicent Ally, MD;  Location: [facility] INVASIVE CV LAB;  Service: Cardiovascular;  Laterality: N/A;   LUNG SURGERY  05/31/1994   "S/P CABG, had to put staple in lung after it had collapsed"   MINOR REMOVAL OF PERITONEAL DIALYSIS CATHETER  10/2022   UMBILICAL HERNIA REPAIR     w/chole    SOCIAL HISTORY:   Social History   Tobacco Use   Smoking status: Former    Current packs/day: 0.00    Average packs/day: 3.0 packs/day for 20.0 years (60.0 ttl pk-yrs)    Types:  Cigarettes    Start date: 07/25/1966    Quit date: 07/25/1986    Years since quitting: 37.2   Smokeless tobacco: Never  Substance Use Topics   Alcohol use: No    Alcohol/week: 0.0 standard drinks of alcohol    FAMILY HISTORY:   Family History  Problem Relation Age of Onset   Heart attack Mother        MI   Stroke Mother    Heart disease Mother    Hypertension Mother    Hyperlipidemia Mother    Asthma Mother    Heart disease Father    Rheumatic fever Father    Colon cancer Neg Hx     DRUG ALLERGIES:   Allergies  Allergen Reactions   Predicort [Prednisolone] Other (See Comments)    Stomach pain   Ciprofloxacin Other (See Comments)    GI upset   Hydrochlorothiazide Other (See Comments)    Dehydration   Hydrocodone Nausea Only and Other (See Comments)    Stomach upset    Hydrocodone-Acetaminophen  Nausea Only    Stomach upset   Sulfa Antibiotics Other (See Comments)    Cannot recall   Penicillins Hives, Rash and Other (See Comments)    Has patient had a PCN reaction causing immediate rash, facial/tongue/throat swelling, SOB or lightheadedness with hypotension: YES  Has patient had a PCN reaction causing severe rash involving mucus membranes or skin necrosis: NO  Has patient had a PCN reaction that required hospitalization NO  Has patient had a PCN reaction occurring within the last 10 years:NO  If all of the above answers are "NO", then may proceed with Cephalosporin use.  Has patient had a PCN reaction causing immediate rash, facial/tongue/throat swelling, SOB or lightheadedness with hypotension: YES Has patient had a PCN reaction causing severe rash involving mucus membranes or skin necrosis: NO Has patient had a PCN reaction that required hospitalization NO Has patient had a PCN reaction occurring within the last 10 years:NO If all of the above answers are "NO", then may proceed with Cephalosporin use.    REVIEW OF SYSTEMS:   Review of Systems  Cardiovascular:   Negative for leg swelling.   As per history of present illness. All pertinent systems were reviewed above. Constitutional, HEENT, cardiovascular, respiratory, GI, GU, musculoskeletal, neuro, psychiatric, endocrine, integumentary and hematologic systems were reviewed and are otherwise negative/unremarkable except for positive findings mentioned above in the HPI.   MEDICATIONS AT HOME:   Prior to Admission medications   Medication Sig Start Date End Date Taking? Authorizing Provider  acetaminophen  (TYLENOL ) 650 MG CR tablet Take 1,300 mg by mouth at bedtime.   Yes [provider]  allopurinol  (ZYLOPRIM ) 100 MG tablet TAKE 1 TABLET BY MOUTH EVERYDAY AT BEDTIME 07/08/23  Yes Sheron Dixons, MD  atorvastatin  (LIPITOR) 40 MG tablet TAKE 1 TABLET BY MOUTH EVERYDAY AT BEDTIME 07/15/23  Yes Berglund, Laura H, MD  cetirizine (ZYRTEC) 10 MG chewable tablet Chew 10 mg by mouth daily.   Yes [provider]  Cholecalciferol  (VITAMIN D -3) 125 MCG (5000 UT) TABS Take 2,000 Units by mouth daily.    Yes [provider]  doxepin  (SINEQUAN ) 25 MG capsule Take 1 capsule (25 mg total) by mouth at bedtime. 09/05/23  Yes Sheron Dixons, MD  furosemide  (LASIX ) 40 MG tablet Take 40 mg by mouth daily as needed for edema. Taking 1 1/2 tablets (60mg  total) Pt takes on Tue. Thur. Sat.and Sunday's. 10/12/22  Yes [provider]  gabapentin  (NEURONTIN ) 100 MG capsule Take 100 mg by mouth 2 (two) times daily as needed. 09/12/23  Yes [provider]  levothyroxine  (SYNTHROID ) 75 MCG tablet TAKE 1 TABLET BY MOUTH EVERY DAY BEFORE BREAKFAST 07/19/23  Yes Sheron Dixons, MD  midodrine  (PROAMATINE ) 5 MG tablet Take 1 tablet (5 mg total) by mouth 3 (three) times daily with meals. 08/09/23  Yes Wouk, Haynes Lips, MD  Multiple Vitamin (MULTIVITAMIN PO) Take 1 tablet by mouth daily.   Yes [provider]  nitroGLYCERIN  (NITROSTAT ) 0.4 MG SL tablet Place 0.4 mg under the tongue every  5 (five) minutes as needed for chest pain (Up to 3 times).   Yes [provider]  pantoprazole  (PROTONIX ) 40 MG tablet Take 1 tablet (40 mg total) by mouth 2 (two) times daily. Home med. 07/23/23  Yes Berglund, Laura H, MD  senna (SENOKOT) 8.6 MG tablet Take 1 tablet by mouth as needed for constipation.   Yes [provider]      VITAL SIGNS:  Blood pressure 121/69, pulse (!) 104, temperature 98.1 F (36.7 C), temperature source Oral, resp. rate 13, height 5\' 9"  (1.753 m), weight 93 kg, SpO2 100%.  PHYSICAL EXAMINATION:  Physical Exam  GENERAL:  78 y.o.-year-old patient lying in the bed with no acute distress.  EYES: Pupils equal, round, reactive to light and accommodation. No scleral icterus. Extraocular muscles intact.  HEENT: Head atraumatic, normocephalic. Oropharynx and nasopharynx clear.  NECK:  Supple, no jugular venous distention. No thyroid  enlargement, no tenderness.  LUNGS: Normal breath sounds bilaterally, no wheezing, rales,rhonchi or crepitation. No use of accessory muscles of respiration.  CARDIOVASCULAR: Irregularly irregular rhythm, S1, S2 normal. No murmurs, rubs, or gallops.  ABDOMEN: Soft, nondistended, nontender. Bowel sounds present. No organomegaly or mass.  EXTREMITIES: 2+ bilateral lower extremity pitting soft edema, with no cyanosis, or clubbing.  NEUROLOGIC: Cranial nerves II through XII are intact. Muscle strength 5/5 in all extremities. Sensation intact. Gait not checked.  PSYCHIATRIC: The patient is alert and oriented x 3.  Normal affect and good eye contact. SKIN: No obvious rash, lesion, or ulcer.   LABORATORY PANEL:   CBC Recent Labs  Lab 09/29/23 1833  WBC 6.3  HGB 10.3*  HCT 32.4*  PLT 140*   ------------------------------------------------------------------------------------------------------------------  Chemistries  Recent Labs  Lab 09/29/23 1833  NA 133*  K 6.0*  CL 96*  CO2 21*  GLUCOSE 116*  BUN 35*  CREATININE  5.90*  CALCIUM  9.2  MG 2.6*   ------------------------------------------------------------------------------------------------------------------  Cardiac Enzymes No results for input(s): "TROPONINI" in the last 168 hours. ------------------------------------------------------------------------------------------------------------------  RADIOLOGY:  CT Cervical Spine Wo Contrast Result Date: 09/29/2023 CLINICAL  DATA:  Dizziness, fell backwards, hit head EXAM: CT CERVICAL SPINE WITHOUT CONTRAST TECHNIQUE: Multidetector CT imaging of the cervical spine was performed without intravenous contrast. Multiplanar CT image reconstructions were also generated. RADIATION DOSE REDUCTION: This exam was performed according to the departmental dose-optimization program which includes automated exposure control, adjustment of the mA and/or kV according to patient size and/or use of iterative reconstruction technique. COMPARISON:  11/10/2022 FINDINGS: Alignment: Normal. Skull base and vertebrae: No acute fracture. No primary bone lesion or focal pathologic process. Soft tissues and spinal canal: No prevertebral fluid or swelling. No visible canal hematoma. Dense atherosclerosis of the carotid arteries. Disc levels: Hypertrophic changes at the C1-C2 interface. Mild diffuse facet hypertrophy. Disc spaces are relatively well preserved. Upper chest: Airway is patent. Postsurgical changes and scarring at the right apex. Other: Reconstructed images demonstrate no additional findings. IMPRESSION: 1. No acute cervical spine fracture. 2. Stable cervical degenerative changes. Electronically Signed   By: Bobbye Burrow M.D.   On: 09/29/2023 20:35   CT Head Wo Contrast Result Date: 09/29/2023 CLINICAL DATA:  Dizziness, fell backwards, hit head EXAM: CT HEAD WITHOUT CONTRAST TECHNIQUE: Contiguous axial images were obtained from the base of the skull through the vertex without intravenous contrast. RADIATION DOSE REDUCTION: This exam  was performed according to the departmental dose-optimization program which includes automated exposure control, adjustment of the mA and/or kV according to patient size and/or use of iterative reconstruction technique. COMPARISON:  11/10/2022 FINDINGS: Brain: There are scattered hypodensities within the bilateral frontal periventricular white matter, more pronounced than prior study, consistent with age-indeterminate small vessel ischemic changes. No evidence of acute hemorrhage. The lateral ventricles and midline structures are otherwise unremarkable. No acute extra-axial fluid collections. No mass effect. Vascular: Atherosclerosis of the internal carotid arteries. No hyperdense vessel. Skull: Normal. Negative for fracture or focal lesion. Sinuses/Orbits: No acute finding. Other: None. IMPRESSION: 1. Scattered hypodensities within the bilateral frontal periventricular white matter, increased since prior exam, consistent with age-indeterminate small vessel ischemic changes. 2. No evidence of acute hemorrhage. Electronically Signed   By: Bobbye Burrow M.D.   On: 09/29/2023 20:32      IMPRESSION AND PLAN:  Assessment and Plan: * Non-sustained ventricular tachycardia (HCC) -This is associated with near syncope. - Patient was admitted to a progressive unit bed. - Will monitor electrolytes. - Cardiology consult will be obtained. - We will obtain TSH.  Paroxysmal atrial fibrillation (HCC) - It is now with controlled ventricular response after amiodarone . - Troponin was 27 later 63. - Will place on IV heparin . - Will obtain a 2D echo. - Will defer further management to the cardiologist.  ESRD (end stage renal disease) (HCC) - The patient had hyperkalemia that was aggressively managed. - Nephrology consult to be obtained. - I notified Dr. Rhesa Celeste about the patient.  Dyslipidemia - Will continue statin therapy.  Gout - Will continue allopurinol   Hypothyroidism Will continue Synthroid  and  check TSH.  GERD without esophagitis - Will continue PPI therapy.   DVT prophylaxis: IV heparin  Advanced Care Planning:  Code Status: full code. Family Communication:  The plan of care was discussed in details with the patient (and family). I answered all questions. The patient agreed to proceed with the above mentioned plan. Further management will depend upon hospital course. Disposition Plan: Back to previous home environment Consults called: Cardiology All the records are reviewed and case discussed with ED provider.  Status is: Observation  I certify that at the time of admission, it is my  clinical judgment that the patient will require  hospital care extending less than 2 midnights.                            Dispo: The patient is from: Home              Anticipated d/c is to: Home              Patient currently is not medically stable to d/c.              Difficult to place patient: No  Virgene Griffin M.D on 09/30/2023 at 2:05 AM  [facility]   From 7 PM-7 AM, contact night-coverage [email or web address]  CC: Primary care physician; Sheron Dixons, MD

## 2023-09-29 NOTE — Assessment & Plan Note (Signed)
 Will continue PPI therapy.

## 2023-09-29 NOTE — ED Triage Notes (Signed)
 Patient states he was mowing his lawn and felt dizzy, fell backwards and hit head on his back porch. No LOC; no thinners. Patient awake and alert on arrival.

## 2023-09-29 NOTE — ED Notes (Signed)
 Requested CCMD to monitor patient's telemetry.

## 2023-09-30 ENCOUNTER — Encounter: Payer: Self-pay | Admitting: Internal Medicine

## 2023-09-30 DIAGNOSIS — N186 End stage renal disease: Secondary | ICD-10-CM | POA: Diagnosis not present

## 2023-09-30 DIAGNOSIS — I5023 Acute on chronic systolic (congestive) heart failure: Secondary | ICD-10-CM

## 2023-09-30 DIAGNOSIS — I480 Paroxysmal atrial fibrillation: Secondary | ICD-10-CM | POA: Diagnosis not present

## 2023-09-30 DIAGNOSIS — E785 Hyperlipidemia, unspecified: Secondary | ICD-10-CM | POA: Insufficient documentation

## 2023-09-30 DIAGNOSIS — I4729 Other ventricular tachycardia: Secondary | ICD-10-CM | POA: Diagnosis not present

## 2023-09-30 DIAGNOSIS — D631 Anemia in chronic kidney disease: Secondary | ICD-10-CM | POA: Diagnosis not present

## 2023-09-30 DIAGNOSIS — E875 Hyperkalemia: Secondary | ICD-10-CM | POA: Diagnosis not present

## 2023-09-30 LAB — CBC
HCT: 28.8 % — ABNORMAL LOW (ref 39.0–52.0)
Hemoglobin: 9.3 g/dL — ABNORMAL LOW (ref 13.0–17.0)
MCH: 31.6 pg (ref 26.0–34.0)
MCHC: 32.3 g/dL (ref 30.0–36.0)
MCV: 98 fL (ref 80.0–100.0)
Platelets: 101 10*3/uL — ABNORMAL LOW (ref 150–400)
RBC: 2.94 MIL/uL — ABNORMAL LOW (ref 4.22–5.81)
RDW: 15.7 % — ABNORMAL HIGH (ref 11.5–15.5)
WBC: 5.3 10*3/uL (ref 4.0–10.5)
nRBC: 0 % (ref 0.0–0.2)

## 2023-09-30 LAB — BASIC METABOLIC PANEL WITH GFR
Anion gap: 10 (ref 5–15)
BUN: 37 mg/dL — ABNORMAL HIGH (ref 8–23)
CO2: 24 mmol/L (ref 22–32)
Calcium: 9 mg/dL (ref 8.9–10.3)
Chloride: 100 mmol/L (ref 98–111)
Creatinine, Ser: 6.1 mg/dL — ABNORMAL HIGH (ref 0.61–1.24)
GFR, Estimated: 9 mL/min — ABNORMAL LOW (ref >60)
Glucose, Bld: 93 mg/dL (ref 70–99)
Potassium: 5.5 mmol/L — ABNORMAL HIGH (ref 3.5–5.1)
Sodium: 134 mmol/L — ABNORMAL LOW (ref 135–145)

## 2023-09-30 LAB — CBG MONITORING, ED
Glucose-Capillary: 74 mg/dL (ref 70–99)
Glucose-Capillary: 84 mg/dL (ref 70–99)
Glucose-Capillary: 84 mg/dL (ref 70–99)

## 2023-09-30 LAB — PROTIME-INR
INR: 1.1 (ref 0.8–1.2)
Prothrombin Time: 14.8 s (ref 11.4–15.2)

## 2023-09-30 LAB — HEPARIN LEVEL (UNFRACTIONATED)
Heparin Unfractionated: <0.1 [IU]/mL — ABNORMAL LOW (ref 0.30–0.70)
Heparin Unfractionated: 0.77 [IU]/mL — ABNORMAL HIGH (ref 0.30–0.70)

## 2023-09-30 LAB — APTT: aPTT: 31 s (ref 24–36)

## 2023-09-30 LAB — GLUCOSE, CAPILLARY: Glucose-Capillary: 79 mg/dL (ref 70–99)

## 2023-09-30 MED ORDER — HEPARIN BOLUS VIA INFUSION
4500.0000 [IU] | Freq: Once | INTRAVENOUS | Status: AC
  Administered 2023-09-30: 4500 [IU] via INTRAVENOUS
  Filled 2023-09-30: qty 4500

## 2023-09-30 MED ORDER — FUROSEMIDE 10 MG/ML IJ SOLN: 40.0000 mg | Freq: Once | INTRAMUSCULAR | Status: DC

## 2023-09-30 MED ORDER — FUROSEMIDE 40 MG PO TABS
60.0000 mg | ORAL_TABLET | ORAL | Status: DC
  Administered 2023-10-01: 60 mg via ORAL
  Filled 2023-09-30: qty 1

## 2023-09-30 MED ORDER — ASPIRIN 81 MG PO TBEC
81.0000 mg | DELAYED_RELEASE_TABLET | Freq: Every day | ORAL | Status: DC
  Administered 2023-09-30 – 2023-10-01 (×2): 81 mg via ORAL
  Filled 2023-09-30 (×2): qty 1

## 2023-09-30 MED ORDER — AMIODARONE HCL 200 MG PO TABS
400.0000 mg | ORAL_TABLET | Freq: Two times a day (BID) | ORAL | Status: DC
  Administered 2023-09-30 – 2023-10-01 (×3): 400 mg via ORAL
  Filled 2023-09-30 (×3): qty 2

## 2023-09-30 MED ORDER — HEPARIN (PORCINE) 25000 UT/250ML-% IV SOLN
1200.0000 [IU]/h | INTRAVENOUS | Status: DC
  Administered 2023-09-30: 1200 [IU]/h via INTRAVENOUS
  Administered 2023-09-30: 1300 [IU]/h via INTRAVENOUS
  Filled 2023-09-30 (×2): qty 250

## 2023-09-30 MED ORDER — DOXEPIN HCL 25 MG PO CAPS: 25.0000 mg | ORAL_CAPSULE | Freq: Every day | ORAL | 0 refills | Status: DC

## 2023-09-30 MED ORDER — SODIUM CHLORIDE 0.9 % IV BOLUS: 500.0000 mL | Freq: Once | INTRAVENOUS | Status: DC

## 2023-09-30 MED ORDER — INSULIN ASPART 100 UNIT/ML IJ SOLN
0.0000 [IU] | Freq: Three times a day (TID) | INTRAMUSCULAR | Status: DC
  Filled 2023-09-30: qty 1

## 2023-09-30 MED ORDER — INSULIN ASPART 100 UNIT/ML IJ SOLN: 0.0000 [IU] | Freq: Every day | INTRAMUSCULAR | Status: DC

## 2023-09-30 MED ORDER — CHLORHEXIDINE GLUCONATE CLOTH 2 % EX PADS
6.0000 | MEDICATED_PAD | Freq: Every day | CUTANEOUS | Status: DC
  Administered 2023-10-01: 6 via TOPICAL
  Filled 2023-09-30 (×2): qty 6

## 2023-09-30 NOTE — Assessment & Plan Note (Signed)
 Will continue statin therapy

## 2023-09-30 NOTE — Progress Notes (Signed)
  Received patient in bed to unit.   Informed consent signed and in chart.    TX duration: 3.5hrs     Transported back to ED Hand-off given to patient's nurse. No c/o and no acute distress noted    Access used: R HD Catheter Access issues: none   Total UF removed: 0 Medication(s) given: none Post HD VS: wnl  Pts heparin  bag completed at 1730. Called to ED nurse to come hang another bag, was told that she will call me right back.  No call back received.  At 1752 secure chat to ED nurse asking to come to dialysis and hang another heparin  bag on pt. ED nurse never responded to chat.   Report given to [facility] RN, pts new nurse and advised her of above.       Bettye Bruins LPN [facility]

## 2023-09-30 NOTE — Progress Notes (Addendum)
 ANTICOAGULATION CONSULT NOTE  Pharmacy Consult for heparin  infusion Indication: atrial fibrillation  Allergies  Allergen Reactions   Predicort [Prednisolone] Other (See Comments)    Stomach pain   Ciprofloxacin Other (See Comments)    GI upset   Hydrochlorothiazide Other (See Comments)    Dehydration   Hydrocodone Nausea Only and Other (See Comments)    Stomach upset    Hydrocodone-Acetaminophen  Nausea Only    Stomach upset   Sulfa Antibiotics Other (See Comments)    Cannot recall   Penicillins Hives, Rash and Other (See Comments)    Has patient had a PCN reaction causing immediate rash, facial/tongue/throat swelling, SOB or lightheadedness with hypotension: YES  Has patient had a PCN reaction causing severe rash involving mucus membranes or skin necrosis: NO  Has patient had a PCN reaction that required hospitalization NO  Has patient had a PCN reaction occurring within the last 10 years:NO  If all of the above answers are "NO", then may proceed with Cephalosporin use.  Has patient had a PCN reaction causing immediate rash, facial/tongue/throat swelling, SOB or lightheadedness with hypotension: YES Has patient had a PCN reaction causing severe rash involving mucus membranes or skin necrosis: NO Has patient had a PCN reaction that required hospitalization NO Has patient had a PCN reaction occurring within the last 10 years:NO If all of the above answers are "NO", then may proceed with Cephalosporin use.    Patient Measurements: Height: 5\' 9"  (175.3 cm) Weight: 93 kg (205 lb) IBW/kg (Calculated) : 70.7 Heparin  Dosing Weight: 89.8 kg  Vital Signs: Temp: 97.7 F (36.5 C) (05/02 0750) Temp Source: Oral (05/02 0750) BP: 113/60 (05/02 0800) Pulse Rate: 92 (05/02 0800)  Labs: Recent Labs    09/29/23 1833 09/29/23 2128 09/30/23 0021 09/30/23 0416 09/30/23 0944  HGB 10.3*  --   --  9.3*  --   HCT 32.4*  --   --  28.8*  --   PLT 140*  --   --  101*  --   APTT  --   --   31  --   --   LABPROT  --   --  14.8  --   --   INR  --   --  1.1  --   --   HEPARINUNFRC  --   --  <0.10*  --  0.77*  CREATININE 5.90*  --   --  6.10*  --   TROPONINIHS 27* 63*  --   --   --     Estimated Creatinine Clearance: 11.1 mL/min (A) (by C-G formula based on SCr of 6.1 mg/dL (H)).   Medical History: Past Medical History:  Diagnosis Date   AAA (abdominal aortic aneurysm) (HCC)    a. 3cm by US  2015; b. 07/2023 Ao U/S: Abd Ao 2.6cm.   Arthritis    "hips; back" (12/13/2014)   CAD (coronary artery disease)    a. 1996 s/p CABGx4: LIMA-LAD, VG-Cx, VG-RCA, VG-diag; b. 2007 s/p redo CABGx2: VG-OM, VG-RCA due to VG disease; c. NSTEMI 11/2014 s/p DES to SVG-OM from the Y graft; d. 09/2015 PCI: distal body of SVG-Diag; e. 07/2017 Cath: LAD 100p, LCX 100p, RCA 95ost/100p, VG->dRCA 10p ISR, 30p/m/d, Y graft->OM3 (ok) & D2 2m, patent distal stent, LIMA->LAD ok, VG->OM2 100, VG->RPAV 100-->Med rx.   Chronic combined systolic and diastolic CHF (congestive heart failure) (HCC)    a. remote EF 40-45% in 2006. b. Normal EF 2014. c. Echo 07/2016 EF 45-50%, grade 1 DD. d.  Echo 2020 30% to 35%; e. 03/2023 Echo: EF 20-25%, glob HK, inf wall best preserved. Mildly reduced RV fxn, RVSP 39.79mmHg. Mod dil LA. Mod MR/TR. AoV sclerosis.   Chronic lower back pain    CKD (chronic kidney disease), stage IV (HCC)    COPD (chronic obstructive pulmonary disease) (HCC)    Deafness in left ear    Degenerative disc disease, lumbar    Diabetes mellitus, type 2 (HCC) 10/04/2014   Microalbumin 05/11/2012-100. Foot exam/monofilament 05/11/2012-normal.   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 CHA2DS2VASc = 6-->eliquis .  On Amio.   PVC's (premature ventricular contractions)    Renal artery stenosis (HCC)    a. noted on CT 2008.   Type II diabetes mellitus (HCC)    Diet control    Unstable angina (HCC) 07/27/2017   Walking pneumonia 1990's   Wears dentures    full upper    Medications:  PTA Meds: Apixaban  5 mg BID.  Pt's state he has not taken in over a month. Baseline anti-Xa level undetectable, corroborating patient's word.  Assessment: Pt is a 78 yo male presenting to ED c/o lightheadedness causing fall, hitting head.  Pt found with non-sustained ventricular tachycardia and paroxysmal Afib, being started on heparin .  Goal of Therapy:  Heparin  level 0.3-0.7 units/ml Monitor platelets by anticoagulation protocol: Yes  0502 @ 0944: HL 0.77   Plan:  Reduce heparin  infusion to 1200 units/hr Recheck HL in 8 hrs after rate change CBC daily while on heparin   Will M. Alva Jewels, PharmD Clinical Pharmacist 09/30/2023 10:21 AM

## 2023-09-30 NOTE — ED Notes (Signed)
 Pt back from dialysis. Pt alert and oriented x 4. Breathing even and unlabored. Dialysis site clean dry and intact. Heparin  restarted as ordered. Wife at bedside.

## 2023-09-30 NOTE — Assessment & Plan Note (Signed)
-   The patient had hyperkalemia that was aggressively managed. - Nephrology consult to be obtained. - I notified Dr. Rhesa Celeste about the patient.

## 2023-09-30 NOTE — Care Management Obs Status (Signed)
 MEDICARE OBSERVATION STATUS NOTIFICATION   Patient Details  Name: Ronald [name] MRN: [id number] Date of Birth: 05-29-1946   Medicare Observation Status Notification Given:  Rudolph Cost, CMA 09/30/2023, 2:42 PM

## 2023-09-30 NOTE — Consult Note (Signed)
 Advanced Heart Failure Team Consult Note   Primary Physician: Sheron Dixons, MD Cardiologist:  Antoinette Batman, MD  Reason for Consultation: VT  HPI:    [name] is seen today for evaluation of presyncope/VT at the request of Dr. Achilles Holes.   78 y.o. with history of CABG and redo, ischemic cardiomyopathy/systolic CHF, ESRD, chronic AF/AFL, COPD, and bleeding at dialysis graft site presented to ER after a presyncopal event with VT.   Patient's last echo was in 10/24, EF 20-25%, mild RV dysfunction, moderate MR and moderate TR.  Last cath was in 2019 with occluded native vessels and patent LIMA-LAD, Y SVG to D and dLCx, SVG-RCA.  He has been persistently in atrial fibrillation or atypical flutter.  He was taken off amiodarone  earlier this year presumably due to futility in maintaining NSR. He was taken off Eliquis  in 3/25 due to ongoing bleeding from dialysis site.  He has had difficulty tolerating HD, has required midodrine  to keep his BP up.  At baseline, he is short of breath walking short distances around the house.  No chest pain.  Due suspected end-stage HF also with ESRD, ICD was not recommended in the past.   Patient rode his lawnmower to cut the grass yesterday.  When walking back into the garage, he noted palpitations and felt lightheaded.  He fell backwards and hit his head but did not pass out.  No chest pain.  No change in his chronic dyspnea pattern. He came to the ER CT head showed no bleed. Initial ECG showed suspected VT rate 160s.  Of note, K was 6.  He had not missed HD.  He received a bolus of amiodarone  and since then it appears that he has been in atypical flutter vs atrial fibrillation, which he remains in this morning.  No chest pain, no dyspnea at rest. Troponin 27 => 63.   Home Medications Prior to Admission medications   Medication Sig Start Date End Date Taking? Authorizing Provider  acetaminophen  (TYLENOL ) 650 MG CR tablet Take 1,300 mg by mouth at  bedtime.   Yes [provider]  allopurinol  (ZYLOPRIM ) 100 MG tablet TAKE 1 TABLET BY MOUTH EVERYDAY AT BEDTIME 07/08/23  Yes Sheron Dixons, MD  atorvastatin  (LIPITOR) 40 MG tablet TAKE 1 TABLET BY MOUTH EVERYDAY AT BEDTIME 07/15/23  Yes Berglund, Laura H, MD  cetirizine (ZYRTEC) 10 MG chewable tablet Chew 10 mg by mouth daily.   Yes [provider]  Cholecalciferol  (VITAMIN D -3) 125 MCG (5000 UT) TABS Take 2,000 Units by mouth daily.    Yes [provider]  furosemide  (LASIX ) 40 MG tablet Take 40 mg by mouth daily as needed for edema. Taking 1 1/2 tablets (60mg  total) Pt takes on Tue. Thur. Sat.and Sunday's. 10/12/22  Yes [provider]  gabapentin  (NEURONTIN ) 100 MG capsule Take 100 mg by mouth 2 (two) times daily as needed. 09/12/23  Yes [provider]  levothyroxine  (SYNTHROID ) 75 MCG tablet TAKE 1 TABLET BY MOUTH EVERY DAY BEFORE BREAKFAST 07/19/23  Yes Sheron Dixons, MD  midodrine  (PROAMATINE ) 5 MG tablet Take 1 tablet (5 mg total) by mouth 3 (three) times daily with meals. 08/09/23  Yes Wouk, Haynes Lips, MD  Multiple Vitamin (MULTIVITAMIN PO) Take 1 tablet by mouth daily.   Yes [provider]  nitroGLYCERIN  (NITROSTAT ) 0.4 MG SL tablet Place 0.4 mg under the tongue every 5 (five) minutes as needed for chest pain (Up to 3 times).   Yes  [provider]  pantoprazole  (PROTONIX ) 40 MG tablet Take 1 tablet (40 mg total) by mouth 2 (two) times daily. Home med. 07/23/23  Yes Berglund, Laura H, MD  senna (SENOKOT) 8.6 MG tablet Take 1 tablet by mouth as needed for constipation.   Yes [provider]  doxepin  (SINEQUAN ) 25 MG capsule Take 1 capsule (25 mg total) by mouth at bedtime. 09/30/23   Sheron Dixons, MD    Past Medical History: Past Medical History:  Diagnosis Date   AAA (abdominal aortic aneurysm) (HCC)    a. 3cm by US  2015; b. 07/2023 Ao U/S: Abd Ao 2.6cm.   Arthritis    "hips; back" (12/13/2014)   CAD  (coronary artery disease)    a. 1996 s/p CABGx4: LIMA-LAD, VG-Cx, VG-RCA, VG-diag; b. 2007 s/p redo CABGx2: VG-OM, VG-RCA due to VG disease; c. NSTEMI 11/2014 s/p DES to SVG-OM from the Y graft; d. 09/2015 PCI: distal body of SVG-Diag; e. 07/2017 Cath: LAD 100p, LCX 100p, RCA 95ost/100p, VG->dRCA 10p ISR, 30p/m/d, Y graft->OM3 (ok) & D2 47m, patent distal stent, LIMA->LAD ok, VG->OM2 100, VG->RPAV 100-->Med rx.   Chronic combined systolic and diastolic CHF (congestive heart failure) (HCC)    a. remote EF 40-45% in 2006. b. Normal EF 2014. c. Echo 07/2016 EF 45-50%, grade 1 DD. d. Echo 2020 30% to 35%; e. 03/2023 Echo: EF 20-25%, glob HK, inf wall best preserved. Mildly reduced RV fxn, RVSP 39.21mmHg. Mod dil LA. Mod MR/TR. AoV sclerosis.   Chronic lower back pain    CKD (chronic kidney disease), stage IV (HCC)    COPD (chronic obstructive pulmonary disease) (HCC)    Deafness in left ear    Degenerative disc disease, lumbar    Diabetes mellitus, type 2 (HCC) 10/04/2014   Microalbumin 05/11/2012-100. Foot exam/monofilament 05/11/2012-normal.   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 CHA2DS2VASc = 6-->eliquis .  On Amio.   PVC's (premature ventricular contractions)    Renal artery stenosis (HCC)    a. noted on CT 2008.   Type II diabetes mellitus (HCC)    Diet control    Unstable angina (HCC) 07/27/2017   Walking pneumonia 1990's   Wears dentures    full upper    Past Surgical  History: Past Surgical History:  Procedure Laterality Date   CARDIAC CATHETERIZATION  "several"   CARDIAC CATHETERIZATION N/A 12/13/2014   Procedure: Left Heart Cath and Coronary Angiography;  Surgeon: Lucendia Rusk, MD;  Location: [facility] INVASIVE CV LAB;  Service: Cardiovascular;  Laterality: N/A;   CARDIAC CATHETERIZATION  12/13/2014   Procedure: Coronary Stent Intervention;  Surgeon: Lucendia Rusk, MD;  Location: [facility] INVASIVE CV LAB;  Service: Cardiovascular;;   CARDIAC CATHETERIZATION N/A 10/07/2015   Procedure: Left Heart Cath and Cors/Grafts Angiography;  Surgeon: Odie Benne, MD;  Location: [facility] INVASIVE CV LAB;  Service: Cardiovascular;  Laterality: N/A;   CARDIAC CATHETERIZATION N/A 10/07/2015   Procedure: Coronary Stent  Intervention;  Surgeon: Odie Benne, MD;  Location: [facility] INVASIVE CV LAB;  Service: Cardiovascular;  Laterality: N/A;   CARDIOVERSION N/A 05/25/2022   Procedure: CARDIOVERSION;  Surgeon: Jerryl Morin, DO;  Location: [facility] ENDOSCOPY;  Service: Cardiovascular;  Laterality: N/A;   CATARACT EXTRACTION W/PHACO Left 10/21/2021   Procedure: CATARACT EXTRACTION PHACO AND INTRAOCULAR LENS PLACEMENT (IOC) LEFT 3.94 00:33.7;  Surgeon: Rosa College, MD;  Location: [facility] SURGERY CNTR;  Service: Ophthalmology;  Laterality: Left;   CATARACT EXTRACTION W/PHACO Right 11/09/2021   Procedure: CATARACT EXTRACTION PHACO AND INTRAOCULAR LENS PLACEMENT (IOC) RIGHT;  Surgeon: Rosa College, MD;  Location: [facility] SURGERY CNTR;  Service: Ophthalmology;  Laterality: Right;  3.59 0:29.4   CORONARY ANGIOPLASTY  "several"   CORONARY ANGIOPLASTY WITH STENT PLACEMENT  2005; 12/13/2014   "2; 1"   CORONARY ARTERY BYPASS GRAFT  05/31/1994   CABG X5   CORONARY ARTERY BYPASS GRAFT  07/29/2005   CABG X3   DIALYSIS/PERMA CATHETER INSERTION N/A 09/10/2022   Procedure: DIALYSIS/PERMA CATHETER INSERTION;  Surgeon: Jackquelyn Mass, MD;  Location: [facility] INVASIVE CV LAB;   Service: Cardiovascular;  Laterality: N/A;   ESOPHAGOGASTRODUODENOSCOPY (EGD) WITH ESOPHAGEAL DILATION  05/31/1998   GREEN LIGHT LASER TURP (TRANSURETHRAL RESECTION OF PROSTATE  01/30/1999   "not cancerous"   HERNIA REPAIR     LAPAROSCOPIC CHOLECYSTECTOMY     LEFT HEART CATH AND CORS/GRAFTS ANGIOGRAPHY N/A 07/28/2017   Procedure: LEFT HEART CATH AND CORS/GRAFTS ANGIOGRAPHY;  Surgeon: Millicent Ally, MD;  Location: [facility] INVASIVE CV LAB;  Service: Cardiovascular;  Laterality: N/A;   LUNG SURGERY  05/31/1994   "S/P CABG, had to put staple in lung after it had collapsed"   MINOR REMOVAL OF PERITONEAL DIALYSIS CATHETER  10/2022   UMBILICAL HERNIA REPAIR     w/chole    Family History: Family History  Problem Relation Age of Onset   Heart attack Mother        MI   Stroke Mother    Heart disease Mother    Hypertension Mother    Hyperlipidemia Mother    Asthma Mother    Heart disease Father    Rheumatic fever Father    Colon cancer Neg Hx     Social History: Social History   Socioeconomic History   Marital status: Married    Spouse name: Goodman Toennies   Number of children: 2   Years of education: Not on file   Highest education level: 8th grade  Occupational History   Occupation: Retired  Tobacco Use   Smoking status: Former    Current packs/day: 0.00    Average packs/day: 3.0 packs/day for 20.0 years (60.0 ttl pk-yrs)    Types: Cigarettes    Start date: 07/25/1966    Quit date: 07/25/1986    Years since quitting: 37.2   Smokeless tobacco: Never  Vaping Use   Vaping status: Never Used  Substance and Sexual Activity   Alcohol use: No    Alcohol/week: 0.0 standard drinks of alcohol   Drug use: No   Sexual activity: Not Currently  Other Topics Concern   Not on file  Social History Narrative   Did auto salvage work.   Lives at home with his wife.  Independent at baseline.   Social Drivers of Corporate investment banker Strain: Low Risk  (06/23/2023)   Overall  Financial Resource Strain (CARDIA)    Difficulty of Paying Living Expenses: Not hard at all  Food Insecurity: No Food Insecurity (08/08/2023)  Hunger Vital Sign    Worried About Running Out of Food in the Last Year: Never true    Ran Out of Food in the Last Year: Never true  Transportation Needs: No Transportation Needs (08/08/2023)   PRAPARE - Administrator, Civil Service (Medical): No    Lack of Transportation (Non-Medical): No  Physical Activity: Inactive (06/23/2023)   Exercise Vital Sign    Days of Exercise per Week: 0 days    Minutes of Exercise per Session: 0 min  Stress: No Stress Concern Present (06/23/2023)   [name] of Occupational Health - Occupational Stress Questionnaire    Feeling of Stress : Only a little  Social Connections: Moderately Isolated (08/08/2023)   Social Connection and Isolation Panel [NHANES]    Frequency of Communication with Friends and Family: Twice a week    Frequency of Social Gatherings with Friends and Family: Three times a week    Attends Religious Services: Never    Active Member of Clubs or Organizations: No    Attends Banker Meetings: Never    Marital Status: Married    Allergies:  Allergies  Allergen Reactions   Predicort [Prednisolone] Other (See Comments)    Stomach pain   Ciprofloxacin Other (See Comments)    GI upset   Hydrochlorothiazide Other (See Comments)    Dehydration   Hydrocodone Nausea Only and Other (See Comments)    Stomach upset    Hydrocodone-Acetaminophen  Nausea Only    Stomach upset   Sulfa Antibiotics Other (See Comments)    Cannot recall   Penicillins Hives, Rash and Other (See Comments)    Has patient had a PCN reaction causing immediate rash, facial/tongue/throat swelling, SOB or lightheadedness with hypotension: YES  Has patient had a PCN reaction causing severe rash involving mucus membranes or skin necrosis: NO  Has patient had a PCN reaction that required  hospitalization NO  Has patient had a PCN reaction occurring within the last 10 years:NO  If all of the above answers are "NO", then may proceed with Cephalosporin use.  Has patient had a PCN reaction causing immediate rash, facial/tongue/throat swelling, SOB or lightheadedness with hypotension: YES Has patient had a PCN reaction causing severe rash involving mucus membranes or skin necrosis: NO Has patient had a PCN reaction that required hospitalization NO Has patient had a PCN reaction occurring within the last 10 years:NO If all of the above answers are "NO", then may proceed with Cephalosporin use.    Objective:    Vital Signs:   Temp:  [97.7 F (36.5 C)-98.1 F (36.7 C)] 97.7 F (36.5 C) (05/02 0750) Pulse Rate:  [56-168] 92 (05/02 0800) Resp:  [11-27] 15 (05/02 0800) BP: (83-121)/(54-69) 113/60 (05/02 0800) SpO2:  [94 %-100 %] 100 % (05/02 0800) Weight:  [93 kg] 93 kg (05/01 2007)    Weight change: Filed Weights   09/29/23 2007  Weight: 93 kg    Intake/Output:   Intake/Output Summary (Last 24 hours) at 09/30/2023 1025 Last data filed at 09/29/2023 1917 Gross per 24 hour  Intake 1000 ml  Output --  Net 1000 ml      Physical Exam    General:  Well appearing. No resp difficulty HEENT: normal Neck: supple. JVP 14 cm. Carotids 2+ bilat; no bruits. No lymphadenopathy or thyromegaly appreciated. Cor: PMI nondisplaced. Irregular rate & rhythm. No rubs, gallops or murmurs. Lungs: clear Abdomen: soft, nontender, nondistended. No hepatosplenomegaly. No bruits or masses. Good bowel sounds. Extremities: no  cyanosis, clubbing, rash. 1+ ankle edema.  Neuro: alert & orientedx3, cranial nerves grossly intact. moves all 4 extremities w/o difficulty. Affect pleasant   Telemetry   Atypical atrial flutter (personally reviewed)  EKG    WCT suspect VT rate 167; atrial fibrillation with RVR and RBBB at 110  Labs   Basic Metabolic Panel: Recent Labs  Lab 09/29/23 1833  09/30/23 0416  NA 133* 134*  K 6.0* 5.5*  CL 96* 100  CO2 21* 24  GLUCOSE 116* 93  BUN 35* 37*  CREATININE 5.90* 6.10*  CALCIUM  9.2 9.0  MG 2.6*  --     Liver Function Tests: No results for input(s): "AST", "ALT", "ALKPHOS", "BILITOT", "PROT", "ALBUMIN" in the last 168 hours. No results for input(s): "LIPASE", "AMYLASE" in the last 168 hours. No results for input(s): "AMMONIA" in the last 168 hours.  CBC: Recent Labs  Lab 09/29/23 1833 09/30/23 0416  WBC 6.3 5.3  NEUTROABS 4.8  --   HGB 10.3* 9.3*  HCT 32.4* 28.8*  MCV 98.5 98.0  PLT 140* 101*    Cardiac Enzymes: No results for input(s): "CKTOTAL", "CKMB", "CKMBINDEX", "TROPONINI" in the last 168 hours.  BNP: BNP (last 3 results) No results for input(s): "BNP" in the last 8760 hours.  ProBNP (last 3 results) No results for input(s): "PROBNP" in the last 8760 hours.   CBG: Recent Labs  Lab 09/29/23 1956 09/30/23 0740  GLUCAP 112* 74    Coagulation Studies: Recent Labs    09/30/23 0021  LABPROT 14.8  INR 1.1     Imaging   CT Cervical Spine Wo Contrast Result Date: 09/29/2023 CLINICAL DATA:  Dizziness, fell backwards, hit head EXAM: CT CERVICAL SPINE WITHOUT CONTRAST TECHNIQUE: Multidetector CT imaging of the cervical spine was performed without intravenous contrast. Multiplanar CT image reconstructions were also generated. RADIATION DOSE REDUCTION: This exam was performed according to the departmental dose-optimization program which includes automated exposure control, adjustment of the mA and/or kV according to patient size and/or use of iterative reconstruction technique. COMPARISON:  11/10/2022 FINDINGS: Alignment: Normal. Skull base and vertebrae: No acute fracture. No primary bone lesion or focal pathologic process. Soft tissues and spinal canal: No prevertebral fluid or swelling. No visible canal hematoma. Dense atherosclerosis of the carotid arteries. Disc levels: Hypertrophic changes at the C1-C2  interface. Mild diffuse facet hypertrophy. Disc spaces are relatively well preserved. Upper chest: Airway is patent. Postsurgical changes and scarring at the right apex. Other: Reconstructed images demonstrate no additional findings. IMPRESSION: 1. No acute cervical spine fracture. 2. Stable cervical degenerative changes. Electronically Signed   By: Bobbye Burrow M.D.   On: 09/29/2023 20:35   CT Head Wo Contrast Result Date: 09/29/2023 CLINICAL DATA:  Dizziness, fell backwards, hit head EXAM: CT HEAD WITHOUT CONTRAST TECHNIQUE: Contiguous axial images were obtained from the base of the skull through the vertex without intravenous contrast. RADIATION DOSE REDUCTION: This exam was performed according to the departmental dose-optimization program which includes automated exposure control, adjustment of the mA and/or kV according to patient size and/or use of iterative reconstruction technique. COMPARISON:  11/10/2022 FINDINGS: Brain: There are scattered hypodensities within the bilateral frontal periventricular white matter, more pronounced than prior study, consistent with age-indeterminate small vessel ischemic changes. No evidence of acute hemorrhage. The lateral ventricles and midline structures are otherwise unremarkable. No acute extra-axial fluid collections. No mass effect. Vascular: Atherosclerosis of the internal carotid arteries. No hyperdense vessel. Skull: Normal. Negative for fracture or focal lesion. Sinuses/Orbits: No acute finding.  Other: None. IMPRESSION: 1. Scattered hypodensities within the bilateral frontal periventricular white matter, increased since prior exam, consistent with age-indeterminate small vessel ischemic changes. 2. No evidence of acute hemorrhage. Electronically Signed   By: Bobbye Burrow M.D.   On: 09/29/2023 20:32     Medications:     Current Medications:  allopurinol   100 mg Oral QHS   amiodarone   400 mg Oral BID   atorvastatin   40 mg Oral QHS   Chlorhexidine   Gluconate Cloth  6 each Topical Q0600   cholecalciferol   2,000 Units Oral Daily   doxepin   25 mg Oral QHS   [START ON 10/01/2023] furosemide   60 mg Oral Once per day on Sunday Tuesday Thursday Saturday   insulin  aspart  0-15 Units Subcutaneous TID WC   insulin  aspart  0-5 Units Subcutaneous QHS   levothyroxine   75 mcg Oral Q0600   loratadine   10 mg Oral Daily   midodrine   5 mg Oral TID WC   multivitamin with minerals  1 tablet Oral Daily   pantoprazole   40 mg Oral BID    Infusions:  amiodarone      heparin  1,300 Units/hr (09/30/23 0416)      Assessment/Plan   1. Presyncope: Patient did not completely pass out but felt lightheaded. No chest pain.  In the ER, he was noted to have WCT which appears to be VT.  Clearly different morphology from his AF/atypical AFL.  VT resolved with amiodarone  bolus.  Suspect scar-mediated VT, do not think he presented with ACS.  Hyperkalemia may have contributed.  No chest pain and minimal troponin elevation.  He has a known ischemic cardiomyopathy with LV EF 20-25%.  In the past, he has been deemed a poor candidate for ICD with end-stage HF and ESRD.   - Given overall poor prognosis (requires midodrine  to tolerate HD, unable to take GDMT) and ESRD, would avoid ICD.  - I am going to start him on amiodarone  at 400 mg bid.  Can continue x 1 week then 200 mg bid x 1 week then 200 mg daily.  2. Acute on chronic systolic CHF: Ischemic cardiomyopathy.  Echo in 10/24 showed EF 20-25%, mild RV dysfunction, moderate MR and moderate TR.  He is volume overloaded on exam and has NYHA class IIIb symptoms at home.  He has a hard time getting adequate fluid removal at HD and is on midodrine  to facilitate this.  - Needs HD today for volume removal.  - Unable to tolerate GDMT due to hypotension with HD, he is on midodrine .  3. CAD: H/o CABG and redo.  Last cath in 2019 with with occluded native vessels and patent LIMA-LAD, Y SVG to D and dLCx, SVG-RCA. No chest pain, no evidence  for ACS.   - I do not think repeat catheterization is going to be beneficial (no ACS or chest pain).  - He is not anticoagulated at home due to bleeding, would try to maintain him on ASA 81.   - He is on heparin  gtt currently, doubt ACS and think we can stop.  - Continue statin.  4. Atrial fibrillation/flutter: This appears chronic.  He is currently in atypical atrial flutter with reasonable rate control.  He was taken off amiodarone  in the past.   - Restarting amiodarone  for VT.   - He has not been anticoagulated due to recurrent bleeding from HD access site.  Can continue heparin  gtt while here but suspect we will continue to keep him off Eliquis .  5.  ESRD: Volume overloaded today with ongoing hyperkalemia.  - Will need HD today.   Goals of care conversations would be appropriate given suspected end stage HF + ESRD.   Length of Stay: 0  Peder Bourdon, MD  09/30/2023, 10:25 AM    Advanced Heart Failure Team Pager [phone] (M-F; 7a - 5p)  Please contact [facility] for night-coverage after hours (4p -7a ) and weekends on [email or web address]

## 2023-09-30 NOTE — ED Notes (Signed)
 Patient up to recliner to help with chronic back pain.

## 2023-09-30 NOTE — Progress Notes (Signed)
 09/30/2023 2200 Received pt to room 247 from ED.  Pt is A&O, no C/O voiced.  Tele monitor applied and CCMD notified.  Oriented to room, call light and bed.  Call bell in reach. Carleene Chase

## 2023-09-30 NOTE — Progress Notes (Signed)
 [facility]  ROUNDING NOTE   Subjective:   Ronald [name] is a 78 year old male with past medical conditions including CAD, type 2 diabetes, emphysema, GERD, hyperlipidemia, hypertension, AAA, and end-stage renal disease on hemodialysis.  Patient presents to the emergency department with dizziness and syncopal episode and has been admitted for Non-sustained ventricular tachycardia [facility]) [I47.29]  Patient is known to our practice and receives outpatient dialysis treatments at [facility]. Last treatment received on Thursday. Patient states he had been mowing the lawn and sat down to complete a task for his wife.  Reports he got up and was walking into the house, this last thing he remembers.  He does report some dizziness prior to passing out.  Labs on ED arrival concerning for sodium 133, potassium 6.0, serum bicarb 21, BUN 35, creatinine 5.9 with GFR 9, troponin 27, and hemoglobin 10.3.  CT head shows scattered hypodensities, consistent with age deterioration.  CT spine negative for cervical fractures.  We have been consulted to manage dialysis needs during this admission.   Objective:  Vital signs in last 24 hours:  Temp:  [97.7 F (36.5 C)-98.1 F (36.7 C)] 97.8 F (36.6 C) (05/02 1157) Pulse Rate:  [56-168] 101 (05/02 1322) Resp:  [11-27] 21 (05/02 1322) BP: (83-121)/(54-69) 113/60 (05/02 1159) SpO2:  [94 %-100 %] 97 % (05/02 1322) Weight:  [93 kg] 93 kg (05/01 2007)  Weight change:  Filed Weights   09/29/23 2007  Weight: 93 kg    Intake/Output: I/O last 3 completed shifts: In: 1000 [IV Piggyback:1000] Out: -    Intake/Output this shift:  No intake/output data recorded.  Physical Exam: General: NAD  Head: Normocephalic, atraumatic. Moist oral mucosal membranes  Eyes: Anicteric  Lungs:  Clear to auscultation, normal effort  Heart: Regular rate and rhythm  Abdomen:  Soft, nontender  Extremities:  No peripheral edema.  Neurologic: Alert, moving all  four extremities  Skin: No lesions  Access: Rt permcath    Basic Metabolic Panel: Recent Labs  Lab 09/29/23 1833 09/30/23 0416  NA 133* 134*  K 6.0* 5.5*  CL 96* 100  CO2 21* 24  GLUCOSE 116* 93  BUN 35* 37*  CREATININE 5.90* 6.10*  CALCIUM  9.2 9.0  MG 2.6*  --     Liver Function Tests: No results for input(s): "AST", "ALT", "ALKPHOS", "BILITOT", "PROT", "ALBUMIN" in the last 168 hours. No results for input(s): "LIPASE", "AMYLASE" in the last 168 hours. No results for input(s): "AMMONIA" in the last 168 hours.  CBC: Recent Labs  Lab 09/29/23 1833 09/30/23 0416  WBC 6.3 5.3  NEUTROABS 4.8  --   HGB 10.3* 9.3*  HCT 32.4* 28.8*  MCV 98.5 98.0  PLT 140* 101*    Cardiac Enzymes: No results for input(s): "CKTOTAL", "CKMB", "CKMBINDEX", "TROPONINI" in the last 168 hours.  BNP: Invalid input(s): "POCBNP"  CBG: Recent Labs  Lab 09/29/23 1956 09/30/23 0740 09/30/23 1206  GLUCAP 112* 74 84    Microbiology: Results for orders placed or performed during the hospital encounter of 08/05/23  Urine Culture     Status: Abnormal   Collection Time: 08/06/23  3:40 PM   Specimen: Urine, Random  Result Value Ref Range Status   Specimen Description   Final    URINE, RANDOM Performed at [facility], [address], [place], [place] [address]    Special Requests   Final    URINE, CLEAN CATCH Performed at [facility], [address] [place],  [place] [address]    Culture MULTIPLE SPECIES PRESENT, SUGGEST RECOLLECTION (A)  Final   Report Status 08/07/2023 FINAL  Final  Culture, blood (Routine X 2) w Reflex to ID Panel     Status: None   Collection Time: 08/06/23  4:15 PM   Specimen: BLOOD  Result Value Ref Range Status   Specimen Description BLOOD BLOOD RIGHT ARM  Final   Special Requests   Final    BOTTLES DRAWN AEROBIC AND ANAEROBIC Blood Culture results may not be optimal due to an inadequate volume of blood received in culture bottles   Culture    Final    NO GROWTH 5 DAYS Performed at [facility], [address] [place], [place] [address]    Report Status 08/11/2023 FINAL  Final  Culture, blood (Routine X 2) w Reflex to ID Panel     Status: None   Collection Time: 08/06/23  5:25 PM   Specimen: BLOOD  Result Value Ref Range Status   Specimen Description BLOOD RW  Final   Special Requests   Final    BOTTLES DRAWN AEROBIC AND ANAEROBIC Blood Culture adequate volume   Culture   Final    NO GROWTH 5 DAYS Performed at [facility], [address] [place], [place] [address]    Report Status 08/11/2023 FINAL  Final    Coagulation Studies: Recent Labs    09/30/23 0021  LABPROT 14.8  INR 1.1    Urinalysis: No results for input(s): "COLORURINE", "LABSPEC", "PHURINE", "GLUCOSEU", "HGBUR", "BILIRUBINUR", "KETONESUR", "PROTEINUR", "UROBILINOGEN", "NITRITE", "LEUKOCYTESUR" in the last 72 hours.  Invalid input(s): "APPERANCEUR"    Imaging: CT Cervical Spine Wo Contrast Result Date: 09/29/2023 CLINICAL DATA:  Dizziness, fell backwards, hit head EXAM: CT CERVICAL SPINE WITHOUT CONTRAST TECHNIQUE: Multidetector CT imaging of the cervical spine was performed without intravenous contrast. Multiplanar CT image reconstructions were also generated. RADIATION DOSE REDUCTION: This exam was performed according to the departmental dose-optimization program which includes automated exposure control, adjustment of the mA and/or kV according to patient size and/or use of iterative reconstruction technique. COMPARISON:  11/10/2022 FINDINGS: Alignment: Normal. Skull base and vertebrae: No acute fracture. No primary bone lesion or focal pathologic process. Soft tissues and spinal canal: No prevertebral fluid or swelling. No visible canal hematoma. Dense atherosclerosis of the carotid arteries. Disc levels: Hypertrophic changes at the C1-C2 interface. Mild diffuse facet hypertrophy. Disc spaces are relatively well preserved. Upper  chest: Airway is patent. Postsurgical changes and scarring at the right apex. Other: Reconstructed images demonstrate no additional findings. IMPRESSION: 1. No acute cervical spine fracture. 2. Stable cervical degenerative changes. Electronically Signed   By: Bobbye Burrow M.D.   On: 09/29/2023 20:35   CT Head Wo Contrast Result Date: 09/29/2023 CLINICAL DATA:  Dizziness, fell backwards, hit head EXAM: CT HEAD WITHOUT CONTRAST TECHNIQUE: Contiguous axial images were obtained from the base of the skull through the vertex without intravenous contrast. RADIATION DOSE REDUCTION: This exam was performed according to the departmental dose-optimization program which includes automated exposure control, adjustment of the mA and/or kV according to patient size and/or use of iterative reconstruction technique. COMPARISON:  11/10/2022 FINDINGS: Brain: There are scattered hypodensities within the bilateral frontal periventricular white matter, more pronounced than prior study, consistent with age-indeterminate small vessel ischemic changes. No evidence of acute hemorrhage. The lateral ventricles and midline structures are otherwise unremarkable. No acute extra-axial fluid collections. No mass effect. Vascular: Atherosclerosis of the internal carotid arteries. No hyperdense vessel. Skull: Normal. Negative  for fracture or focal lesion. Sinuses/Orbits: No acute finding. Other: None. IMPRESSION: 1. Scattered hypodensities within the bilateral frontal periventricular white matter, increased since prior exam, consistent with age-indeterminate small vessel ischemic changes. 2. No evidence of acute hemorrhage. Electronically Signed   By: Bobbye Burrow M.D.   On: 09/29/2023 20:32     Medications:    amiodarone      heparin  1,200 Units/hr (09/30/23 1104)    allopurinol   100 mg Oral QHS   amiodarone   400 mg Oral BID   aspirin  EC  81 mg Oral Daily   atorvastatin   40 mg Oral QHS   Chlorhexidine  Gluconate Cloth  6 each  Topical Q0600   cholecalciferol   2,000 Units Oral Daily   doxepin   25 mg Oral QHS   [START ON 10/01/2023] furosemide   60 mg Oral Once per day on Sunday Tuesday Thursday Saturday   insulin  aspart  0-15 Units Subcutaneous TID WC   insulin  aspart  0-5 Units Subcutaneous QHS   levothyroxine   75 mcg Oral Q0600   loratadine   10 mg Oral Daily   midodrine   5 mg Oral TID WC   multivitamin with minerals  1 tablet Oral Daily   pantoprazole   40 mg Oral BID   acetaminophen  **OR** acetaminophen , gabapentin , magnesium  hydroxide, nitroGLYCERIN , ondansetron  **OR** ondansetron  (ZOFRAN ) IV, senna, traZODone   Assessment/ Plan:  Mr. JANTHONY BEHAR is a 78 y.o.  male with past medical conditions including CAD, type 2 diabetes, emphysema, GERD, hyperlipidemia, hypertension, AAA, and end-stage renal disease on hemodialysis.  Patient presents to the emergency department with dizziness and syncopal episode and has been admitted for Non-sustained ventricular tachycardia (HCC) [I47.29]  [facility]/MWF/right PermCath   Hyperkalemia with end-stage renal disease on hemodialysis.  Potassium 6.0 on admission.  Last treatment received on Thursday.  Temporizing measures received in emergency department.  Scheduled to receive dialysis later today.  2. Anemia of chronic kidney disease Lab Results  Component Value Date   HGB 9.3 (L) 09/30/2023    Hemoglobin at desired goal.  3. Secondary Hyperparathyroidism: with outpatient labs: PTH 402, phosphorus 5.9, calcium  7.9 on 08/10/23.   Lab Results  Component Value Date   CALCIUM  9.0 09/30/2023   PHOS 7.2 (H) 08/08/2023    Will continue to monitor bone minerals during this admission.    LOS: 0 Sady Monaco 5/2/20252:30 PM

## 2023-09-30 NOTE — Telephone Encounter (Signed)
 Please review and advise.   JM

## 2023-09-30 NOTE — Progress Notes (Signed)
 Progress Note   Patient: Ronald [name] [facility] ZOX:[id number] DOB: [birth year] DOA: 09/29/2023     0 DOS: the patient was seen and examined on 09/30/2023   Brief hospital course:  78 y.o. Caucasian male with medical history significant for coronary artery disease status post CABG X2, chronic combined systolic and diastolic CHF, ESRD GERD, COPD, type 2 diabetes mellitus and CAD who presented to the emergency room with acute onset of lightheadedness with subsequent fall and head injury without loss of consciousness.   Assessment and Plan:  Nonsustained ventricular tachycardia with associated near syncope - Noted wide-complex tachycardia on presentation.  Resolved after IV amiodarone  bolus.  Currently NSR.  Blood pressure stable.  Transitioning to p.o. amiodarone .  Cardiology on board following closely.  No indication for AICD placement.  Acute on chronic HFrEF - Known ischemic cardiomyopathy, EF 20-25%.  Unable to tolerate GDMT secondary to hypotension.  Currently on midodrine .  Poor candidate for ICD given ESRD.  Volume removal per nephrology/HD.  End-stage renal disease on HD - Usual schedule MWF.  Nephrology consulted.  Dialysis later today.  Chronic atrial fibrillation/flutter - Currently rate controlled after amiodarone .  History of bleeding from HD access site so is not on chronic anticoagulation.  CAD - Evaluated by cardiology.  History of CABG and redo.  No chest pain or evidence of ACS.  No indication for repeat catheterization.  Statin on board.  Goals of care - Patient very motivated to be discharged after anticoagulation.  Given no further intervention by cardiology, transition to p.o. amiodarone , will await hemodialysis then reevaluate for discharge shortly after..  Subjective: Patient resting comfortably this morning.  States he feels markedly improved from before.  No longer dizzy, denies any shortness of breath, chest pain, nausea, vomiting, abdominal pain.  Thinks his dizziness and  V. tach were brought on by working too much in the yard and helping his wife.  Patient is compliant with his medication regimen and compliant with dialysis.  Was to visit dialysis this morning.  Physical Exam: Vitals:   09/30/23 1157 09/30/23 1159 09/30/23 1322 09/30/23 1451  BP:  113/60  110/60  Pulse:  96 (!) 101 87  Resp:  17 (!) 21 (!) 24  Temp: 97.8 F (36.6 C)     TempSrc:      SpO2:  100% 97% 100%  Weight:      Height:        GENERAL:  Alert, pleasant, no acute distress  HEENT:  EOMI CARDIOVASCULAR:  RRR, no murmurs appreciated RESPIRATORY:  Clear to auscultation, no wheezing, rales, or rhonchi GASTROINTESTINAL:  Soft, nontender, nondistended EXTREMITIES:  No LE edema bilaterally NEURO:  No new focal deficits appreciated SKIN:  No rashes noted PSYCH:  Appropriate mood and affect     Data Reviewed:  No new imaging reviewed  Labs: CBC: Recent Labs  Lab 09/29/23 1833 09/30/23 0416  WBC 6.3 5.3  NEUTROABS 4.8  --   HGB 10.3* 9.3*  HCT 32.4* 28.8*  MCV 98.5 98.0  PLT 140* 101*   Basic Metabolic Panel: Recent Labs  Lab 09/29/23 1833 09/30/23 0416  NA 133* 134*  K 6.0* 5.5*  CL 96* 100  CO2 21* 24  GLUCOSE 116* 93  BUN 35* 37*  CREATININE 5.90* 6.10*  CALCIUM  9.2 9.0  MG 2.6*  --    Liver Function Tests: No results for input(s): "AST", "ALT", "ALKPHOS", "BILITOT", "PROT", "ALBUMIN" in the last 168 hours. CBG: Recent Labs  Lab 09/29/23 1956 09/30/23 0740 09/30/23  1206  GLUCAP 112* 74 84    Scheduled Meds:  allopurinol   100 mg Oral QHS   amiodarone   400 mg Oral BID   aspirin  EC  81 mg Oral Daily   atorvastatin   40 mg Oral QHS   Chlorhexidine  Gluconate Cloth  6 each Topical Q0600   cholecalciferol   2,000 Units Oral Daily   doxepin   25 mg Oral QHS   [START ON 10/01/2023] furosemide   60 mg Oral Once per day on Sunday Tuesday Thursday Saturday   insulin  aspart  0-15 Units Subcutaneous TID WC   insulin  aspart  0-5 Units Subcutaneous QHS    levothyroxine   75 mcg Oral Q0600   loratadine   10 mg Oral Daily   midodrine   5 mg Oral TID WC   multivitamin with minerals  1 tablet Oral Daily   pantoprazole   40 mg Oral BID   Continuous Infusions:  amiodarone      heparin  1,200 Units/hr (09/30/23 1104)   PRN Meds:.acetaminophen  **OR** acetaminophen , gabapentin , magnesium  hydroxide, nitroGLYCERIN , ondansetron  **OR** ondansetron  (ZOFRAN ) IV, senna, traZODone   Family Communication: None at bedside  Disposition: Status is: Observation The patient remains OBS appropriate and will d/c before 2 midnights.     Time spent: 35 minutes  Author: Jodeane Mulligan, DO 09/30/2023 2:55 PM  For on call review [email or web address].

## 2023-09-30 NOTE — Progress Notes (Signed)
 ANTICOAGULATION CONSULT NOTE  Pharmacy Consult for heparin  infusion Indication: atrial fibrillation  Allergies  Allergen Reactions   Predicort [Prednisolone] Other (See Comments)    Stomach pain   Ciprofloxacin Other (See Comments)    GI upset   Hydrochlorothiazide Other (See Comments)    Dehydration   Hydrocodone Nausea Only and Other (See Comments)    Stomach upset    Hydrocodone-Acetaminophen  Nausea Only    Stomach upset   Sulfa Antibiotics Other (See Comments)    Cannot recall   Penicillins Hives, Rash and Other (See Comments)    Has patient had a PCN reaction causing immediate rash, facial/tongue/throat swelling, SOB or lightheadedness with hypotension: YES  Has patient had a PCN reaction causing severe rash involving mucus membranes or skin necrosis: NO  Has patient had a PCN reaction that required hospitalization NO  Has patient had a PCN reaction occurring within the last 10 years:NO  If all of the above answers are "NO", then may proceed with Cephalosporin use.  Has patient had a PCN reaction causing immediate rash, facial/tongue/throat swelling, SOB or lightheadedness with hypotension: YES Has patient had a PCN reaction causing severe rash involving mucus membranes or skin necrosis: NO Has patient had a PCN reaction that required hospitalization NO Has patient had a PCN reaction occurring within the last 10 years:NO If all of the above answers are "NO", then may proceed with Cephalosporin use.    Patient Measurements: Height: 5\' 9"  (175.3 cm) Weight: 93 kg (205 lb) IBW/kg (Calculated) : 70.7 Heparin  Dosing Weight: 89.8 kg  Vital Signs: Temp: 98.1 F (36.7 C) (05/02 0000) Temp Source: Oral (05/02 0000) BP: 121/69 (05/02 0000) Pulse Rate: 104 (05/02 0000)  Labs: Recent Labs    09/29/23 1833 09/29/23 2128  HGB 10.3*  --   HCT 32.4*  --   PLT 140*  --   CREATININE 5.90*  --   TROPONINIHS 27* 63*    Estimated Creatinine Clearance: 11.4 mL/min (A) (by  C-G formula based on SCr of 5.9 mg/dL (H)).   Medical History: Past Medical History:  Diagnosis Date   AAA (abdominal aortic aneurysm) (HCC)    a. 3cm by US  2015; b. 07/2023 Ao U/S: Abd Ao 2.6cm.   Arthritis    "hips; back" (12/13/2014)   CAD (coronary artery disease)    a. 1996 s/p CABGx4: LIMA-LAD, VG-Cx, VG-RCA, VG-diag; b. 2007 s/p redo CABGx2: VG-OM, VG-RCA due to VG disease; c. NSTEMI 11/2014 s/p DES to SVG-OM from the Y graft; d. 09/2015 PCI: distal body of SVG-Diag; e. 07/2017 Cath: LAD 100p, LCX 100p, RCA 95ost/100p, VG->dRCA 10p ISR, 30p/m/d, Y graft->OM3 (ok) & D2 50m, patent distal stent, LIMA->LAD ok, VG->OM2 100, VG->RPAV 100-->Med rx.   Chronic combined systolic and diastolic CHF (congestive heart failure) (HCC)    a. remote EF 40-45% in 2006. b. Normal EF 2014. c. Echo 07/2016 EF 45-50%, grade 1 DD. d. Echo 2020 30% to 35%; e. 03/2023 Echo: EF 20-25%, glob HK, inf wall best preserved. Mildly reduced RV fxn, RVSP 39.70mmHg. Mod dil LA. Mod MR/TR. AoV sclerosis.   Chronic lower back pain    CKD (chronic kidney disease), stage IV (HCC)    COPD (chronic obstructive pulmonary disease) (HCC)    Deafness in left ear    Degenerative disc disease, lumbar    Diabetes mellitus, type 2 (HCC) 10/04/2014   Microalbumin 05/11/2012-100. Foot exam/monofilament 05/11/2012-normal.   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   Emphysema    Esophageal stricture 07/02/1998  EGD   Genital candidiasis in male 10/25/2012   GERD (gastroesophageal reflux disease)    History of gout    "last flareup was in 2007" (12/13/2014)   History of hiatal hernia    Hyperlipidemia    Hypertension    Ischemic cardiomyopathy 2006   Echo 2020: EF 30-35%, diffuse hypokinesis   Ischemic cardiomyopathy    Myocardial infarction (HCC) 12/13/2014   NSTEMI (non-ST elevated myocardial infarction) (HCC) 12/13/2014   PAF (paroxysmal atrial fibrillation) (HCC)    a. CHA2DS2VASc = 6-->eliquis .  On Amio.   PVC's (premature  ventricular contractions)    Renal artery stenosis (HCC)    a. noted on CT 2008.   Type II diabetes mellitus (HCC)    Diet control    Unstable angina (HCC) 07/27/2017   Walking pneumonia 1990's   Wears dentures    full upper    Medications:  PTA Meds: Apixaban  5 mg BID.  Pt's state he has not taken in over a month.  Assessment: Pt is a 78 yo male presenting to ED c/o lightheadedness causing fall, hitting head.  Pt found with non-sustained ventricular tachycardia and paroxysmal Afib, being started on heparin .  Goal of Therapy:  Heparin  level 0.3-0.7 units/ml aPTT 66-102 seconds Monitor platelets by anticoagulation protocol: Yes   Plan:  Bolus 4500 units x 1 Start heparin  infusion at 1300 units/hr Baseline HL < 0.10, will monitor using heparin  levels Recheck HL in 8 hrs after infusion started CBC daily while on heparin   Coretta Dexter, PharmD, [facility] 09/30/2023 12:20 AM

## 2023-10-01 ENCOUNTER — Other Ambulatory Visit: Payer: Self-pay | Admitting: Obstetrics and Gynecology

## 2023-10-01 DIAGNOSIS — I4729 Other ventricular tachycardia: Secondary | ICD-10-CM | POA: Diagnosis not present

## 2023-10-01 DIAGNOSIS — N186 End stage renal disease: Secondary | ICD-10-CM | POA: Diagnosis not present

## 2023-10-01 DIAGNOSIS — E875 Hyperkalemia: Secondary | ICD-10-CM | POA: Diagnosis not present

## 2023-10-01 DIAGNOSIS — I480 Paroxysmal atrial fibrillation: Secondary | ICD-10-CM | POA: Diagnosis not present

## 2023-10-01 DIAGNOSIS — N2581 Secondary hyperparathyroidism of renal origin: Secondary | ICD-10-CM | POA: Diagnosis not present

## 2023-10-01 DIAGNOSIS — D631 Anemia in chronic kidney disease: Secondary | ICD-10-CM | POA: Diagnosis not present

## 2023-10-01 LAB — CBC
HCT: 27.5 % — ABNORMAL LOW (ref 39.0–52.0)
Hemoglobin: 9.2 g/dL — ABNORMAL LOW (ref 13.0–17.0)
MCH: 31.8 pg (ref 26.0–34.0)
MCHC: 33.5 g/dL (ref 30.0–36.0)
MCV: 95.2 fL (ref 80.0–100.0)
Platelets: 97 10*3/uL — ABNORMAL LOW (ref 150–400)
RBC: 2.89 MIL/uL — ABNORMAL LOW (ref 4.22–5.81)
RDW: 15.6 % — ABNORMAL HIGH (ref 11.5–15.5)
WBC: 4.5 10*3/uL (ref 4.0–10.5)
nRBC: 0 % (ref 0.0–0.2)

## 2023-10-01 LAB — HEPATITIS B SURFACE ANTIGEN: Hepatitis B Surface Ag: REACTIVE — AB

## 2023-10-01 LAB — HEPARIN LEVEL (UNFRACTIONATED): Heparin Unfractionated: 0.35 [IU]/mL (ref 0.30–0.70)

## 2023-10-01 MED ORDER — ASPIRIN 81 MG PO TBEC: 81.0000 mg | DELAYED_RELEASE_TABLET | Freq: Every day | ORAL | 12 refills | Status: DC

## 2023-10-01 MED ORDER — AMIODARONE HCL 400 MG PO TABS: ORAL_TABLET | ORAL | 1 refills | Status: DC

## 2023-10-01 NOTE — Progress Notes (Signed)
 ANTICOAGULATION CONSULT NOTE  Pharmacy Consult for heparin  infusion Indication: atrial fibrillation  Allergies  Allergen Reactions   Predicort [Prednisolone] Other (See Comments)    Stomach pain   Ciprofloxacin Other (See Comments)    GI upset   Hydrochlorothiazide Other (See Comments)    Dehydration   Hydrocodone Nausea Only and Other (See Comments)    Stomach upset    Hydrocodone-Acetaminophen  Nausea Only    Stomach upset   Sulfa Antibiotics Other (See Comments)    Cannot recall   Penicillins Hives, Rash and Other (See Comments)    Has patient had a PCN reaction causing immediate rash, facial/tongue/throat swelling, SOB or lightheadedness with hypotension: YES  Has patient had a PCN reaction causing severe rash involving mucus membranes or skin necrosis: NO  Has patient had a PCN reaction that required hospitalization NO  Has patient had a PCN reaction occurring within the last 10 years:NO  If all of the above answers are "NO", then may proceed with Cephalosporin use.  Has patient had a PCN reaction causing immediate rash, facial/tongue/throat swelling, SOB or lightheadedness with hypotension: YES Has patient had a PCN reaction causing severe rash involving mucus membranes or skin necrosis: NO Has patient had a PCN reaction that required hospitalization NO Has patient had a PCN reaction occurring within the last 10 years:NO If all of the above answers are "NO", then may proceed with Cephalosporin use.    Patient Measurements: Height: 5\' 9"  (175.3 cm) Weight: 93 kg (205 lb) IBW/kg (Calculated) : 70.7 Heparin  Dosing Weight: 89.8 kg  Vital Signs: Temp: 97.5 F (36.4 C) (05/02 2351) Temp Source: Oral (05/02 2206) BP: 116/55 (05/02 2351) Pulse Rate: 91 (05/02 2351)  Labs: Recent Labs    09/29/23 1833 09/29/23 2128 09/30/23 0021 09/30/23 0416 09/30/23 0944 10/01/23 0413  HGB 10.3*  --   --  9.3*  --  9.2*  HCT 32.4*  --   --  28.8*  --  27.5*  PLT 140*  --   --   101*  --  97*  APTT  --   --  31  --   --   --   LABPROT  --   --  14.8  --   --   --   INR  --   --  1.1  --   --   --   HEPARINUNFRC  --   --  <0.10*  --  0.77* 0.35  CREATININE 5.90*  --   --  6.10*  --   --   TROPONINIHS 27* 63*  --   --   --   --     Estimated Creatinine Clearance: 11.1 mL/min (A) (by C-G formula based on SCr of 6.1 mg/dL (H)).   Medical History: Past Medical History:  Diagnosis Date   AAA (abdominal aortic aneurysm) (HCC)    a. 3cm by US  2015; b. 07/2023 Ao U/S: Abd Ao 2.6cm.   Arthritis    "hips; back" (12/13/2014)   CAD (coronary artery disease)    a. 1996 s/p CABGx4: LIMA-LAD, VG-Cx, VG-RCA, VG-diag; b. 2007 s/p redo CABGx2: VG-OM, VG-RCA due to VG disease; c. NSTEMI 11/2014 s/p DES to SVG-OM from the Y graft; d. 09/2015 PCI: distal body of SVG-Diag; e. 07/2017 Cath: LAD 100p, LCX 100p, RCA 95ost/100p, VG->dRCA 10p ISR, 30p/m/d, Y graft->OM3 (ok) & D2 10m, patent distal stent, LIMA->LAD ok, VG->OM2 100, VG->RPAV 100-->Med rx.   Chronic combined systolic and diastolic CHF (congestive heart failure) (HCC)  a. remote EF 40-45% in 2006. b. Normal EF 2014. c. Echo 07/2016 EF 45-50%, grade 1 DD. d. Echo 2020 30% to 35%; e. 03/2023 Echo: EF 20-25%, glob HK, inf wall best preserved. Mildly reduced RV fxn, RVSP 39.31mmHg. Mod dil LA. Mod MR/TR. AoV sclerosis.   Chronic lower back pain    CKD (chronic kidney disease), stage IV (HCC)    COPD (chronic obstructive pulmonary disease) (HCC)    Deafness in left ear    Degenerative disc disease, lumbar    Diabetes mellitus, type 2 (HCC) 10/04/2014   Microalbumin 05/11/2012-100. Foot exam/monofilament 05/11/2012-normal.   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   Emphysema    Esophageal stricture 07/02/1998   EGD   Genital candidiasis in male 10/25/2012   GERD (gastroesophageal reflux disease)    History of gout    "last flareup was in 2007" (12/13/2014)   History of hiatal hernia    Hyperlipidemia    Hypertension     Ischemic cardiomyopathy 2006   Echo 2020: EF 30-35%, diffuse hypokinesis   Ischemic cardiomyopathy    Myocardial infarction (HCC) 12/13/2014   NSTEMI (non-ST elevated myocardial infarction) (HCC) 12/13/2014   PAF (paroxysmal atrial fibrillation) (HCC)    a. CHA2DS2VASc = 6-->eliquis .  On Amio.   PVC's (premature ventricular contractions)    Renal artery stenosis (HCC)    a. noted on CT 2008.   Type II diabetes mellitus (HCC)    Diet control    Unstable angina (HCC) 07/27/2017   Walking pneumonia 1990's   Wears dentures    full upper    Medications:  PTA Meds: Apixaban  5 mg BID.  Pt's state he has not taken in over a month. Baseline anti-Xa level undetectable, corroborating patient's word.  Assessment: Pt is a 78 yo male presenting to ED c/o lightheadedness causing fall, hitting head.  Pt found with non-sustained ventricular tachycardia and paroxysmal Afib, being started on heparin .  Goal of Therapy:  Heparin  level 0.3-0.7 units/ml Monitor platelets by anticoagulation protocol: Yes  0502 @ 0944: HL 0.77 0503 0413 HL 0.35, therapeutic x 1   Plan:  Continue heparin  infusion at 1200 units/hr Recheck HL in 8 hrs to confirm CBC daily while on heparin   Coretta Dexter, PharmD, [facility] 10/01/2023 5:41 AM

## 2023-10-01 NOTE — Progress Notes (Signed)
 [facility]  ROUNDING NOTE   Subjective:   Ronald [name] is a 78 year old male with past medical conditions including CAD, type 2 diabetes, emphysema, GERD, hyperlipidemia, hypertension, AAA, and end-stage renal disease on hemodialysis.  Patient presents to the emergency department with dizziness and syncopal episode and has been admitted for Hematoma [T14.8XXA] Wide-complex tachycardia [R00.0] Non-sustained ventricular tachycardia (HCC) [I47.29] Near syncope [R55]  Patient is known to our practice and receives outpatient dialysis treatments at [facility]. Last treatment received on Thursday.   Patient seen sitting up in bed Partially completed breakfast tray at bedside Patient denies dizziness or shortness of breath Currently on room air   Objective:  Vital signs in last 24 hours:  Temp:  [97.3 F (36.3 C)-98.1 F (36.7 C)] 97.3 F (36.3 C) (05/03 0738) Pulse Rate:  [87-101] 88 (05/03 0738) Resp:  [10-24] 16 (05/02 2351) BP: (110-128)/(55-74) 125/58 (05/03 0738) SpO2:  [97 %-100 %] 97 % (05/03 0738)  Weight change:  Filed Weights   09/29/23 2007  Weight: 93 kg    Intake/Output: I/O last 3 completed shifts: In: 1452.3 [I.V.:452.3; IV Piggyback:1000] Out: 0    Intake/Output this shift:  Total I/O In: 282.2 [P.O.:240; I.V.:42.2] Out: 150 [Blood:150]  Physical Exam: General: NAD  Head: Normocephalic, atraumatic. Moist oral mucosal membranes  Eyes: Anicteric  Lungs:  Clear to auscultation, normal effort  Heart: Regular rate and rhythm  Abdomen:  Soft, nontender  Extremities:  No peripheral edema.  Neurologic: Alert, moving all four extremities  Skin: No lesions  Access: Rt permcath    Basic Metabolic Panel: Recent Labs  Lab 09/29/23 1833 09/30/23 0416  NA 133* 134*  K 6.0* 5.5*  CL 96* 100  CO2 21* 24  GLUCOSE 116* 93  BUN 35* 37*  CREATININE 5.90* 6.10*  CALCIUM  9.2 9.0  MG 2.6*  --     Liver Function Tests: No results for  input(s): "AST", "ALT", "ALKPHOS", "BILITOT", "PROT", "ALBUMIN" in the last 168 hours. No results for input(s): "LIPASE", "AMYLASE" in the last 168 hours. No results for input(s): "AMMONIA" in the last 168 hours.  CBC: Recent Labs  Lab 09/29/23 1833 09/30/23 0416 10/01/23 0413  WBC 6.3 5.3 4.5  NEUTROABS 4.8  --   --   HGB 10.3* 9.3* 9.2*  HCT 32.4* 28.8* 27.5*  MCV 98.5 98.0 95.2  PLT 140* 101* 97*    Cardiac Enzymes: No results for input(s): "CKTOTAL", "CKMB", "CKMBINDEX", "TROPONINI" in the last 168 hours.  BNP: Invalid input(s): "POCBNP"  CBG: Recent Labs  Lab 09/29/23 1956 09/30/23 0740 09/30/23 1206 09/30/23 1946 09/30/23 2239  GLUCAP 112* 74 84 84 79    Microbiology: Results for orders placed or performed during the hospital encounter of 08/05/23  Urine Culture     Status: Abnormal   Collection Time: 08/06/23  3:40 PM   Specimen: Urine, Random  Result Value Ref Range Status   Specimen Description   Final    URINE, RANDOM Performed at [facility], [address] [place], [place] [address]    Special Requests   Final    URINE, CLEAN CATCH Performed at [facility], [address] [place], [place] [address]    Culture MULTIPLE SPECIES PRESENT, SUGGEST RECOLLECTION (A)  Final   Report Status 08/07/2023 FINAL  Final  Culture, blood (Routine X 2) w Reflex to ID Panel     Status: None   Collection Time: 08/06/23  4:15 PM   Specimen: BLOOD  Result Value Ref Range Status   Specimen Description BLOOD BLOOD RIGHT ARM  Final   Special Requests   Final    BOTTLES DRAWN AEROBIC AND ANAEROBIC Blood Culture results may not be optimal due to an inadequate volume of blood received in culture bottles   Culture   Final    NO GROWTH 5 DAYS Performed at [facility], [address] [place], [place] [address]    Report Status 08/11/2023 FINAL  Final  Culture, blood (Routine X 2) w Reflex to ID Panel     Status: None   Collection Time:  08/06/23  5:25 PM   Specimen: BLOOD  Result Value Ref Range Status   Specimen Description BLOOD RW  Final   Special Requests   Final    BOTTLES DRAWN AEROBIC AND ANAEROBIC Blood Culture adequate volume   Culture   Final    NO GROWTH 5 DAYS Performed at [facility], [address] [place], [place] [address]    Report Status 08/11/2023 FINAL  Final    Coagulation Studies: Recent Labs    09/30/23 0021  LABPROT 14.8  INR 1.1    Urinalysis: No results for input(s): "COLORURINE", "LABSPEC", "PHURINE", "GLUCOSEU", "HGBUR", "BILIRUBINUR", "KETONESUR", "PROTEINUR", "UROBILINOGEN", "NITRITE", "LEUKOCYTESUR" in the last 72 hours.  Invalid input(s): "APPERANCEUR"    Imaging: CT Cervical Spine Wo Contrast Result Date: 09/29/2023 CLINICAL DATA:  Dizziness, fell backwards, hit head EXAM: CT CERVICAL SPINE WITHOUT CONTRAST TECHNIQUE: Multidetector CT imaging of the cervical spine was performed without intravenous contrast. Multiplanar CT image reconstructions were also generated. RADIATION DOSE REDUCTION: This exam was performed according to the departmental dose-optimization program which includes automated exposure control, adjustment of the mA and/or kV according to patient size and/or use of iterative reconstruction technique. COMPARISON:  11/10/2022 FINDINGS: Alignment: Normal. Skull base and vertebrae: No acute fracture. No primary bone lesion or focal pathologic process. Soft tissues and spinal canal: No prevertebral fluid or swelling. No visible canal hematoma. Dense atherosclerosis of the carotid arteries. Disc levels: Hypertrophic changes at the C1-C2 interface. Mild diffuse facet hypertrophy. Disc spaces are relatively well preserved. Upper chest: Airway is patent. Postsurgical changes and scarring at the right apex. Other: Reconstructed images demonstrate no additional findings. IMPRESSION: 1. No acute cervical spine fracture. 2. Stable cervical degenerative changes.  Electronically Signed   By: Bobbye Burrow M.D.   On: 09/29/2023 20:35   CT Head Wo Contrast Result Date: 09/29/2023 CLINICAL DATA:  Dizziness, fell backwards, hit head EXAM: CT HEAD WITHOUT CONTRAST TECHNIQUE: Contiguous axial images were obtained from the base of the skull through the vertex without intravenous contrast. RADIATION DOSE REDUCTION: This exam was performed according to the departmental dose-optimization program which includes automated exposure control, adjustment of the mA and/or kV according to patient size and/or use of iterative reconstruction technique. COMPARISON:  11/10/2022 FINDINGS: Brain: There are scattered hypodensities within the bilateral frontal periventricular white matter, more pronounced than prior study, consistent with age-indeterminate small vessel ischemic changes. No evidence of acute hemorrhage. The lateral ventricles and midline structures are otherwise unremarkable. No acute extra-axial fluid collections. No mass effect. Vascular: Atherosclerosis of the internal carotid arteries. No hyperdense vessel. Skull: Normal. Negative for fracture or focal lesion. Sinuses/Orbits: No acute finding. Other: None. IMPRESSION: 1. Scattered hypodensities within the bilateral frontal periventricular white matter, increased since prior exam, consistent with age-indeterminate small vessel ischemic changes. 2. No evidence of acute hemorrhage. Electronically Signed   By: Bobbye Burrow M.D.   On: 09/29/2023  20:32     Medications:    amiodarone      heparin  Stopped (10/01/23 1030)    allopurinol   100 mg Oral QHS   amiodarone   400 mg Oral BID   aspirin  EC  81 mg Oral Daily   atorvastatin   40 mg Oral QHS   Chlorhexidine  Gluconate Cloth  6 each Topical Q0600   cholecalciferol   2,000 Units Oral Daily   doxepin   25 mg Oral QHS   furosemide   60 mg Oral Once per day on Sunday Tuesday Thursday Saturday   insulin  aspart  0-15 Units Subcutaneous TID WC   insulin  aspart  0-5 Units  Subcutaneous QHS   levothyroxine   75 mcg Oral Q0600   loratadine   10 mg Oral Daily   midodrine   5 mg Oral TID WC   multivitamin with minerals  1 tablet Oral Daily   pantoprazole   40 mg Oral BID   acetaminophen  **OR** acetaminophen , gabapentin , magnesium  hydroxide, nitroGLYCERIN , ondansetron  **OR** ondansetron  (ZOFRAN ) IV, senna, traZODone   Assessment/ Plan:  Ronald [name] is a 78 y.o.  male with past medical conditions including CAD, type 2 diabetes, emphysema, GERD, hyperlipidemia, hypertension, AAA, and end-stage renal disease on hemodialysis.  Patient presents to the emergency department with dizziness and syncopal episode and has been admitted for Hematoma [T14.8XXA] Wide-complex tachycardia [R00.0] Non-sustained ventricular tachycardia (HCC) [I47.29] Near syncope [R55]  [facility]/MWF/right PermCath   Hyperkalemia with end-stage renal disease on hemodialysis.  Potassium 6.0 on admission.  Patient received scheduled dialysis yesterday, UF 0.  Potassium corrected.  Next treatment scheduled for Monday.  Will defer discharge plan to primary team.  2. Anemia of chronic kidney disease Lab Results  Component Value Date   HGB 9.2 (L) 10/01/2023    Hemoglobin acceptable.  Patient receives Mircera at outpatient clinic..  3. Secondary Hyperparathyroidism: with outpatient labs: PTH 402, phosphorus 5.9, calcium  7.9 on 08/10/23.   Lab Results  Component Value Date   CALCIUM  9.0 09/30/2023   PHOS 7.2 (H) 08/08/2023    Calcium  within optimal range.  Will continue to monitor bone minerals.    LOS: 0 Ronald [name] 5/3/202512:04 PM

## 2023-10-01 NOTE — Plan of Care (Signed)
 Continuing with plan of care.

## 2023-10-01 NOTE — Discharge Summary (Signed)
 Physician Discharge Summary   Patient: Ronald [name] [facility] MRN: [id number] DOB: 11-21-1945  Admit date:     09/29/2023  Discharge date: 10/01/23  Discharge Physician: Jodeane Mulligan   PCP: Sheron Dixons, MD   Recommendations at discharge:    Pt to be discharged home.   If you experience worsening fever, chills, chest pain, shortness of breath, or other concerning symptoms, please call your PCP or go to the emergency department immediately.  Discharge Diagnoses: Principal Problem:   Non-sustained ventricular tachycardia (HCC) Active Problems:   Paroxysmal atrial fibrillation (HCC)   ESRD (end stage renal disease) (HCC)   GERD without esophagitis   Hypothyroidism   Gout   Dyslipidemia  Resolved Problems:   * No resolved hospital problems. [facility] Course:  78 y.o. Caucasian male with medical history significant for coronary artery disease status post CABG X2, chronic combined systolic and diastolic CHF, ESRD GERD, COPD, type 2 diabetes mellitus and CAD who presented to the emergency room with acute onset of lightheadedness with subsequent fall and head injury without loss of consciousness.    Assessment and Plan:   Nonsustained ventricular tachycardia with associated near syncope - Noted wide-complex tachycardia on presentation.  Resolved after IV amiodarone  bolus.  Currently NSR.  Blood pressure stable.  Transitioning to p.o. amiodarone .  Cardiology following closely.  No indication for AICD placement.  Plan to transition patient to amiodarone  400 mg twice daily x 7 days, then 200 mg twice daily x 7 days, then 200 mg daily thereafter.  Follow-up with cardiology in 1 week in the outpatient setting.   Acute on chronic HFrEF - Known ischemic cardiomyopathy, EF 20-25%.  Unable to tolerate GDMT secondary to hypotension.  Currently on midodrine .  Poor candidate for ICD given ESRD.  Volume removal per nephrology/HD.   End-stage renal disease on HD - Usual schedule MWF.  Dialysis  performed here 5/2.   Chronic atrial fibrillation/flutter - Currently rate controlled after amiodarone .  History of bleeding from HD access site so is not on chronic anticoagulation.  Will initiate on aspirin  81 mg daily.   CAD - Evaluated by cardiology.  History of CABG and redo.  No chest pain or evidence of ACS.  No indication for repeat catheterization.  Statin on board.  Aspirin  81 mg daily.    Consultants: Cardiology, nephrology Procedures performed: Hemodialysis Disposition: Home Diet recommendation:  Discharge Diet Orders (From admission, onward)     Start     Ordered   10/01/23 0000  Diet - low sodium heart healthy        10/01/23 0950           Renal diet  DISCHARGE MEDICATION: Allergies as of 10/01/2023       Reactions   Predicort [prednisolone] Other (See Comments)   Stomach pain   Ciprofloxacin Other (See Comments)   GI upset   Hydrochlorothiazide Other (See Comments)   Dehydration   Hydrocodone Nausea Only, Other (See Comments)   Stomach upset   Hydrocodone-acetaminophen  Nausea Only   Stomach upset   Sulfa Antibiotics Other (See Comments)   Cannot recall   Penicillins Hives, Rash, Other (See Comments)   Has patient had a PCN reaction causing immediate rash, facial/tongue/throat swelling, SOB or lightheadedness with hypotension: YES Has patient had a PCN reaction causing severe rash involving mucus membranes or skin necrosis: NO Has patient had a PCN reaction that required hospitalization NO Has patient had a PCN reaction occurring within the last 10 years:NO  If all of the above answers are "NO", then may proceed with Cephalosporin use. Has patient had a PCN reaction causing immediate rash, facial/tongue/throat swelling, SOB or lightheadedness with hypotension: YES Has patient had a PCN reaction causing severe rash involving mucus membranes or skin necrosis: NO Has patient had a PCN reaction that required hospitalization NO Has patient had a PCN reaction  occurring within the last 10 years:NO If all of the above answers are "NO", then may proceed with Cephalosporin use.        Medication List     TAKE these medications    acetaminophen  650 MG CR tablet Commonly known as: TYLENOL  Take 1,300 mg by mouth at bedtime.   allopurinol  100 MG tablet Commonly known as: ZYLOPRIM  TAKE 1 TABLET BY MOUTH EVERYDAY AT BEDTIME   amiodarone  400 MG tablet Commonly known as: PACERONE  Take 1 tablet (400 mg total) by mouth 2 (two) times daily for 7 days, THEN 0.5 tablets (200 mg total) 2 (two) times daily for 7 days, THEN 0.5 tablets (200 mg total) daily. Start taking on: Oct 01, 2023   aspirin  EC 81 MG tablet Take 1 tablet (81 mg total) by mouth daily. Swallow whole. Start taking on: Oct 02, 2023   atorvastatin  40 MG tablet Commonly known as: LIPITOR TAKE 1 TABLET BY MOUTH EVERYDAY AT BEDTIME   cetirizine 10 MG chewable tablet Commonly known as: ZYRTEC Chew 10 mg by mouth daily.   doxepin  25 MG capsule Commonly known as: SINEQUAN  Take 1 capsule (25 mg total) by mouth at bedtime.   furosemide  40 MG tablet Commonly known as: LASIX  Take 40 mg by mouth daily as needed for edema. Taking 1 1/2 tablets (60mg  total) Pt takes on Tue. Thur. Sat.and Sunday's.   gabapentin  100 MG capsule Commonly known as: NEURONTIN  Take 100 mg by mouth 2 (two) times daily as needed.   levothyroxine  75 MCG tablet Commonly known as: SYNTHROID  TAKE 1 TABLET BY MOUTH EVERY DAY BEFORE BREAKFAST   midodrine  5 MG tablet Commonly known as: PROAMATINE  Take 1 tablet (5 mg total) by mouth 3 (three) times daily with meals.   MULTIVITAMIN PO Take 1 tablet by mouth daily.   nitroGLYCERIN  0.4 MG SL tablet Commonly known as: NITROSTAT  Place 0.4 mg under the tongue every 5 (five) minutes as needed for chest pain (Up to 3 times).   pantoprazole  40 MG tablet Commonly known as: PROTONIX  Take 1 tablet (40 mg total) by mouth 2 (two) times daily. Home med.   senna 8.6 MG  tablet Commonly known as: SENOKOT Take 1 tablet by mouth as needed for constipation.   Vitamin D -3 125 MCG (5000 UT) Tabs Take 2,000 Units by mouth daily.         Discharge Exam: Filed Weights   09/29/23 2007  Weight: 93 kg    GENERAL:  Alert, pleasant, no acute distress  HEENT:  EOMI CARDIOVASCULAR:  RRR, no murmurs appreciated RESPIRATORY:  Clear to auscultation, no wheezing, rales, or rhonchi GASTROINTESTINAL:  Soft, nontender, nondistended EXTREMITIES:  No LE edema bilaterally NEURO:  No new focal deficits appreciated SKIN:  No rashes noted PSYCH:  Appropriate mood and affect    Condition at discharge: improving  The results of significant diagnostics from this hospitalization (including imaging, microbiology, ancillary and laboratory) are listed below for reference.   Imaging Studies: CT Cervical Spine Wo Contrast Result Date: 09/29/2023 CLINICAL DATA:  Dizziness, fell backwards, hit head EXAM: CT CERVICAL SPINE WITHOUT CONTRAST TECHNIQUE: Multidetector CT imaging of  the cervical spine was performed without intravenous contrast. Multiplanar CT image reconstructions were also generated. RADIATION DOSE REDUCTION: This exam was performed according to the departmental dose-optimization program which includes automated exposure control, adjustment of the mA and/or kV according to patient size and/or use of iterative reconstruction technique. COMPARISON:  11/10/2022 FINDINGS: Alignment: Normal. Skull base and vertebrae: No acute fracture. No primary bone lesion or focal pathologic process. Soft tissues and spinal canal: No prevertebral fluid or swelling. No visible canal hematoma. Dense atherosclerosis of the carotid arteries. Disc levels: Hypertrophic changes at the C1-C2 interface. Mild diffuse facet hypertrophy. Disc spaces are relatively well preserved. Upper chest: Airway is patent. Postsurgical changes and scarring at the right apex. Other: Reconstructed images demonstrate no  additional findings. IMPRESSION: 1. No acute cervical spine fracture. 2. Stable cervical degenerative changes. Electronically Signed   By: Bobbye Burrow M.D.   On: 09/29/2023 20:35   CT Head Wo Contrast Result Date: 09/29/2023 CLINICAL DATA:  Dizziness, fell backwards, hit head EXAM: CT HEAD WITHOUT CONTRAST TECHNIQUE: Contiguous axial images were obtained from the base of the skull through the vertex without intravenous contrast. RADIATION DOSE REDUCTION: This exam was performed according to the departmental dose-optimization program which includes automated exposure control, adjustment of the mA and/or kV according to patient size and/or use of iterative reconstruction technique. COMPARISON:  11/10/2022 FINDINGS: Brain: There are scattered hypodensities within the bilateral frontal periventricular white matter, more pronounced than prior study, consistent with age-indeterminate small vessel ischemic changes. No evidence of acute hemorrhage. The lateral ventricles and midline structures are otherwise unremarkable. No acute extra-axial fluid collections. No mass effect. Vascular: Atherosclerosis of the internal carotid arteries. No hyperdense vessel. Skull: Normal. Negative for fracture or focal lesion. Sinuses/Orbits: No acute finding. Other: None. IMPRESSION: 1. Scattered hypodensities within the bilateral frontal periventricular white matter, increased since prior exam, consistent with age-indeterminate small vessel ischemic changes. 2. No evidence of acute hemorrhage. Electronically Signed   By: Bobbye Burrow M.D.   On: 09/29/2023 20:32    Microbiology: Results for orders placed or performed during the hospital encounter of 08/05/23  Urine Culture     Status: Abnormal   Collection Time: 08/06/23  3:40 PM   Specimen: Urine, Random  Result Value Ref Range Status   Specimen Description   Final    URINE, RANDOM Performed at [facility], [address] [place], [place] [address]     Special Requests   Final    URINE, CLEAN CATCH Performed at [facility], [address] [place], [place] [address]    Culture MULTIPLE SPECIES PRESENT, SUGGEST RECOLLECTION (A)  Final   Report Status 08/07/2023 FINAL  Final  Culture, blood (Routine X 2) w Reflex to ID Panel     Status: None   Collection Time: 08/06/23  4:15 PM   Specimen: BLOOD  Result Value Ref Range Status   Specimen Description BLOOD BLOOD RIGHT ARM  Final   Special Requests   Final    BOTTLES DRAWN AEROBIC AND ANAEROBIC Blood Culture results may not be optimal due to an inadequate volume of blood received in culture bottles   Culture   Final    NO GROWTH 5 DAYS Performed at [facility], [address] [place], [place] [address]    Report Status 08/11/2023 FINAL  Final  Culture, blood (Routine X 2) w Reflex to ID Panel     Status: None   Collection Time: 08/06/23  5:25 PM  Specimen: BLOOD  Result Value Ref Range Status   Specimen Description BLOOD RW  Final   Special Requests   Final    BOTTLES DRAWN AEROBIC AND ANAEROBIC Blood Culture adequate volume   Culture   Final    NO GROWTH 5 DAYS Performed at [facility], [address] [place], [place] [address]    Report Status 08/11/2023 FINAL  Final    Labs: CBC: Recent Labs  Lab 09/29/23 1833 09/30/23 0416 10/01/23 0413  WBC 6.3 5.3 4.5  NEUTROABS 4.8  --   --   HGB 10.3* 9.3* 9.2*  HCT 32.4* 28.8* 27.5*  MCV 98.5 98.0 95.2  PLT 140* 101* 97*   Basic Metabolic Panel: Recent Labs  Lab 09/29/23 1833 09/30/23 0416  NA 133* 134*  K 6.0* 5.5*  CL 96* 100  CO2 21* 24  GLUCOSE 116* 93  BUN 35* 37*  CREATININE 5.90* 6.10*  CALCIUM  9.2 9.0  MG 2.6*  --    Liver Function Tests: No results for input(s): "AST", "ALT", "ALKPHOS", "BILITOT", "PROT", "ALBUMIN" in the last 168 hours. CBG: Recent Labs  Lab 09/29/23 1956 09/30/23 0740 09/30/23 1206 09/30/23 1946 09/30/23 2239  GLUCAP 112* 74 84 84 79     Discharge time spent: 29 minutes.  Signed: Jodeane Mulligan, DO [facility] 10/01/2023

## 2023-10-01 NOTE — Plan of Care (Signed)
 Discharge teaching completed with patient, patient discharged with all belongings and in stable condition.

## 2023-10-01 NOTE — Hospital Course (Signed)
 78 y.o. Caucasian male with medical history significant for coronary artery disease status post CABG X2, chronic combined systolic and diastolic CHF, ESRD GERD, COPD, type 2 diabetes mellitus and CAD who presented to the emergency room with acute onset of lightheadedness with subsequent fall and head injury without loss of consciousness.    Assessment and Plan:   Nonsustained ventricular tachycardia with associated near syncope - Noted wide-complex tachycardia on presentation.  Resolved after IV amiodarone  bolus.  Currently NSR.  Blood pressure stable.  Transitioning to p.o. amiodarone .  Cardiology on board following closely.  No indication for AICD placement.   Acute on chronic HFrEF - Known ischemic cardiomyopathy, EF 20-25%.  Unable to tolerate GDMT secondary to hypotension.  Currently on midodrine .  Poor candidate for ICD given ESRD.  Volume removal per nephrology/HD.   End-stage renal disease on HD - Usual schedule MWF.  Nephrology consulted.  Dialysis later today.   Chronic atrial fibrillation/flutter - Currently rate controlled after amiodarone .  History of bleeding from HD access site so is not on chronic anticoagulation.   CAD - Evaluated by cardiology.  History of CABG and redo.  No chest pain or evidence of ACS.  No indication for repeat catheterization.  Statin on board.   Goals of care - Patient very motivated to be discharged after anticoagulation.  Given no further intervention by cardiology, transition to p.o. amiodarone , will await hemodialysis then reevaluate for discharge shortly after.Ronald [name]

## 2023-10-03 ENCOUNTER — Encounter: Payer: Self-pay | Admitting: Nephrology

## 2023-10-03 DIAGNOSIS — N186 End stage renal disease: Secondary | ICD-10-CM | POA: Diagnosis not present

## 2023-10-03 DIAGNOSIS — Z23 Encounter for immunization: Secondary | ICD-10-CM | POA: Diagnosis not present

## 2023-10-03 DIAGNOSIS — Z992 Dependence on renal dialysis: Secondary | ICD-10-CM | POA: Diagnosis not present

## 2023-10-03 NOTE — Progress Notes (Signed)
 Patient was given hepatitis B Vaccine as outpatient on 09/28/2023 at [facility].

## 2023-10-05 DIAGNOSIS — Z992 Dependence on renal dialysis: Secondary | ICD-10-CM | POA: Diagnosis not present

## 2023-10-05 DIAGNOSIS — Z23 Encounter for immunization: Secondary | ICD-10-CM | POA: Diagnosis not present

## 2023-10-05 DIAGNOSIS — N186 End stage renal disease: Secondary | ICD-10-CM | POA: Diagnosis not present

## 2023-10-06 ENCOUNTER — Encounter: Payer: Self-pay | Admitting: Internal Medicine

## 2023-10-06 ENCOUNTER — Ambulatory Visit: Payer: Self-pay

## 2023-10-06 NOTE — Telephone Encounter (Signed)
  Chief Complaint: Back Pain Symptoms: upper and lower back pain but lower back pain is worse. Pain is more intense with any movement Frequency: increased recently Pertinent Negatives: Patient denies CP, SOB Disposition: [] ED /[] Urgent Care (no appt availability in office) / [x] Appointment(In office/virtual)/ []  [facility]/ [] Home Care/ [] Refused Recommended Disposition /[] [facility]/ []  Follow-up with PCP Additional Notes: patient's wife calling with patient next to her. Patient is having increased upper and lower back pain. Patient is having to use a walker and pain is intense with movement. Wife had sent a message to PCP early and was told to call to make an appointment with Dr. Augustus Ledger. Dr. Augustus Ledger doesn't have any appointments available until Oct 12, 2023. Patient's wife requested to see PCP since Dr. Augustus Ledger doesn't have any availability soon. Patient is scheduled to see PCP tomorrow 10/07/2023 at 1:40 PM. Patient and wife verbalized understanding of the plan and all questions answered.    Copied from CRM [id number]. Topic: Clinical - Red Word Triage >> Oct 06, 2023 10:45 AM Emylou G wrote: [facility] that prompted transfer to Nurse Triage: patient in extreme back pain.. can barely stand Reason for Disposition  [1] MODERATE back pain (e.g., interferes with normal activities) AND [2] present > 3 days  Answer Assessment - Initial Assessment Questions 1. ONSET: "When did the pain begin?"      Chronic back pain but has gotten worse 2. LOCATION: "Where does it hurt?" (upper, mid or lower back)     Upper and lower back pain but lower is the worse 3. SEVERITY: "How bad is the pain?"  (e.g., Scale 1-10; mild, moderate, or severe)   - MILD (1-3): Doesn't interfere with normal activities.    - MODERATE (4-7): Interferes with normal activities or awakens from sleep.    - SEVERE (8-10): Excruciating pain, unable to do any normal activities.      Patient is sitting while on the  phone but pain is a 10 with any movement 4. PATTERN: "Is the pain constant?" (e.g., yes, no; constant, intermittent)      intermittent 5. RADIATION: "Does the pain shoot into your legs or somewhere else?"     No radiation but endorses pain to legs at times 6. CAUSE:  "What do you think is causing the back pain?"      unsure 7. BACK OVERUSE:  "Any recent lifting of heavy objects, strenuous work or exercise?"     No 8. MEDICINES: "What have you taken so far for the pain?" (e.g., nothing, acetaminophen , NSAIDS)     Tylenol  650 mg, Gabapentin  but wife is leery about giving it to him because of balance issues 9. NEUROLOGIC SYMPTOMS: "Do you have any weakness, numbness, or problems with bowel/bladder control?"     no 10. OTHER SYMPTOMS: "Do you have any other symptoms?" (e.g., fever, abdomen pain, burning with urination, blood in urine)       No  Protocols used: Back Pain-A-AH

## 2023-10-06 NOTE — Telephone Encounter (Signed)
 Please review

## 2023-10-06 NOTE — Telephone Encounter (Signed)
 PT has appt  JM

## 2023-10-07 ENCOUNTER — Encounter: Payer: Self-pay | Admitting: Internal Medicine

## 2023-10-07 ENCOUNTER — Ambulatory Visit (INDEPENDENT_AMBULATORY_CARE_PROVIDER_SITE_OTHER): Admitting: Internal Medicine

## 2023-10-07 VITALS — BP 110/60 | HR 94 | Ht 69.0 in | Wt 207.2 lb

## 2023-10-07 DIAGNOSIS — N186 End stage renal disease: Secondary | ICD-10-CM | POA: Diagnosis not present

## 2023-10-07 DIAGNOSIS — Z23 Encounter for immunization: Secondary | ICD-10-CM | POA: Diagnosis not present

## 2023-10-07 DIAGNOSIS — Z992 Dependence on renal dialysis: Secondary | ICD-10-CM | POA: Diagnosis not present

## 2023-10-07 DIAGNOSIS — M48061 Spinal stenosis, lumbar region without neurogenic claudication: Secondary | ICD-10-CM | POA: Diagnosis not present

## 2023-10-07 DIAGNOSIS — M62830 Muscle spasm of back: Secondary | ICD-10-CM | POA: Diagnosis not present

## 2023-10-07 MED ORDER — METAXALONE 400 MG PO TABS: 400.0000 mg | ORAL_TABLET | Freq: Two times a day (BID) | ORAL | 0 refills | Status: DC

## 2023-10-07 NOTE — Assessment & Plan Note (Signed)
 Longstanding problems Pain worse with standing and bending - relieved by sitting and reclining Continue tylenol , short course of Skelaxin bid Stop gabapentin . Refer to PT; consider return to pain management

## 2023-10-07 NOTE — Progress Notes (Signed)
 Date:  10/07/2023   Name:  [name]   DOB:  November 26, 1945   MRN:  [id number]   Chief Complaint: Back Pain Marvell Slider 1 week ago, went to the ED for a-fib, wound on right hand)  Back Pain This is a chronic problem. The problem occurs daily. The problem is unchanged. The pain is present in the lumbar spine. The quality of the pain is described as aching and cramping. The pain is moderate. Associated symptoms include weakness. Pertinent negatives include no chest pain, fever or numbness. He has tried analgesics (Tylenol  and gabapentin ) for the symptoms.  He fell recently and had CT of head and neck - age appropriate.  He does not think that his back is worse since then. He was seen by Ortho several years ago and then by Dr. Erman Hayward.  He had one or two ESI but did not feel any benefit so he did not return.  He takes tylenol  but gabapentin  is too sedating.  MRI lumbar 2018 by Ortho Dr. Alan All: IMPRESSION: Grade 1 L4-5 anterolisthesis (less than on prior radiograph suspicious for dynamic instability which would be better assessed on flexion extension radiographs). Bilateral L4-5 facet synovial cysts, displacing the traversing RIGHT L5 nerve. No spondylolysis. Moderate canal stenosis L4-5. Mild to moderate L4-5 neural foraminal narrowing. Multilevel annular fissures.   Review of Systems  Constitutional:  Negative for chills, fatigue and fever.  Respiratory:  Negative for chest tightness and shortness of breath.   Cardiovascular:  Negative for chest pain.  Musculoskeletal:  Positive for back pain and gait problem.  Skin:  Positive for wound (large skin tear on right forearm).  Neurological:  Positive for weakness. Negative for seizures and numbness.  Psychiatric/Behavioral:  Negative for dysphoric mood and sleep disturbance. The patient is not nervous/anxious.      Lab Results  Component Value Date   NA 134 (L) 09/30/2023   K 5.5 (H) 09/30/2023   CO2 24 09/30/2023   GLUCOSE 93  09/30/2023   BUN 37 (H) 09/30/2023   CREATININE 6.10 (H) 09/30/2023   CALCIUM  9.0 09/30/2023   EGFR 14 (L) 05/19/2022   GFRNONAA 9 (L) 09/30/2023   Lab Results  Component Value Date   CHOL 127 07/19/2023   HDL 49 07/19/2023   LDLCALC 65 07/19/2023   TRIG 59 07/19/2023   CHOLHDL 2.6 07/19/2023   Lab Results  Component Value Date   TSH 6.584 (H) 08/06/2023   Lab Results  Component Value Date   HGBA1C 4.7 (L) 08/06/2023   Lab Results  Component Value Date   WBC 4.5 10/01/2023   HGB 9.2 (L) 10/01/2023   HCT 27.5 (L) 10/01/2023   MCV 95.2 10/01/2023   PLT 97 (L) 10/01/2023   Lab Results  Component Value Date   ALT 26 08/05/2023   AST 39 08/05/2023   ALKPHOS 121 08/05/2023   BILITOT 1.0 08/05/2023   Lab Results  Component Value Date   VD25OH 83 09/06/2022     Patient Active Problem List   Diagnosis Date Noted   Dyslipidemia 09/30/2023   ESRD (end stage renal disease) (HCC) 09/30/2023   Non-sustained ventricular tachycardia (HCC) 09/29/2023   Hypothyroidism 09/29/2023   Gout 09/29/2023   Tremor of both hands 09/05/2023   Pruritus due to systemic disorder 09/05/2023   Hypotension 08/07/2023   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   Near syncope 08/05/2023   Acute blood loss anemia (ABLA) 08/05/2023   HFrEF (heart failure with reduced ejection fraction) (HCC) 07/19/2023  Muscle tension headache 11/15/2022   Acquired thrombophilia (HCC) 11/10/2022   ESRD (end stage renal disease) on dialysis (HCC) 09/16/2022   Physical deconditioning 09/16/2022   Bilateral leg edema 09/13/2022   Coronary artery disease of native artery of native heart with stable angina pectoris (HCC) 07/22/2022   OSA (obstructive sleep apnea) 07/22/2022   Chronic systolic CHF (congestive heart failure) (HCC) 07/15/2022   Typical atrial flutter (HCC) 07/15/2022   Acquired hypothyroidism 05/28/2022   GERD without esophagitis 05/28/2022   Essential hypertension 05/05/2022    Prediabetes 04/06/2022   Spinal stenosis of lumbar region without neurogenic claudication 04/06/2022   Weakness of both lower extremities 04/06/2022   Upper airway cough syndrome 10/01/2021   Bursitis of hip 12/18/2018   Paroxysmal atrial fibrillation (HCC) 06/10/2018   Atherosclerosis of native arteries of extremity with intermittent claudication (HCC) 06/25/2016   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   Coronary artery disease involving native coronary artery with angina pectoris (HCC)    Allergic rhinitis 08/19/2015   Anemia 10/04/2014   AA (aortic aneurysm) (HCC) 10/04/2014   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   Basal cell papilloma 10/04/2014   Unspecified osteoarthritis, unspecified site 10/04/2014   Thoracic aortic aneurysm (TAA) (HCC) 10/04/2014   Atherosclerotic heart disease of native coronary artery without angina pectoris 10/04/2014   Benign prostatic hyperplasia with lower urinary tract symptoms 10/24/2012   DOE (dyspnea on exertion) 11/12/2009   Renal artery stenosis     Mixed hyperlipidemia 09/04/2008   History of redo bypass grafting 09/04/2008    Allergies  Allergen Reactions   Predicort [Prednisolone] Other (See Comments)    Stomach pain   Ciprofloxacin Other (See Comments)    GI upset   Hydrochlorothiazide Other (See Comments)    Dehydration   Hydrocodone Nausea Only and Other (See Comments)    Stomach upset    Hydrocodone-Acetaminophen  Nausea Only    Stomach upset   Sulfa Antibiotics Other (See Comments)    Cannot recall   Penicillins Hives, Rash and Other (See Comments)    Has patient had a PCN reaction causing immediate rash, facial/tongue/throat swelling, SOB or lightheadedness with hypotension: YES  Has patient had a PCN reaction causing severe rash involving mucus membranes or skin necrosis: NO  Has patient had a PCN reaction that required hospitalization NO  Has patient had a PCN reaction occurring within the last 10 years:NO  If all of the above  answers are "NO", then may proceed with Cephalosporin use.  Has patient had a PCN reaction causing immediate rash, facial/tongue/throat swelling, SOB or lightheadedness with hypotension: YES Has patient had a PCN reaction causing severe rash involving mucus membranes or skin necrosis: NO Has patient had a PCN reaction that required hospitalization NO Has patient had a PCN reaction occurring within the last 10 years:NO If all of the above answers are "NO", then may proceed with Cephalosporin use.    Past Surgical History:  Procedure Laterality Date   CARDIAC CATHETERIZATION  "several"   CARDIAC CATHETERIZATION N/A 12/13/2014   Procedure: Left Heart Cath and Coronary Angiography;  Surgeon: Lucendia Rusk, MD;  Location[facility] INVAS[facility] CV LAB;  Service: Cardiovascular;  Laterality: N/A;   CARDIAC CATHETERIZATION  12/13/2014   Procedure: Coronary Stent Intervention;  Surgeon: Lucendia Rusk, MD;  Location: [facility] INVASIVE CV LAB;  Service: Cardiovascular;;   CARDIAC CATHETERIZATION N/A 10/07/2015   Procedure: Left Heart Cath and Cors/Grafts Angiography;  Surgeon: Odie Benne, MD;  Location: [facility] INVASIVE CV LAB;  Service: Cardiovascular;  Laterality:  N/A;   CARDIAC CATHETERIZATION N/A 10/07/2015   Procedure: Coronary Stent Intervention;  Surgeon: Odie Benne, MD;  Location: [facility] INVASIVE CV LAB;  Service: Cardiovascular;  Laterality: N/A;   CARDIOVERSION N/A 05/25/2022   Procedure: CARDIOVERSION;  Surgeon: Jerryl Morin, DO;  Location: [facility] ENDOSCOPY;  Service: Cardiovascular;  Laterality: N/A;   CATARACT EXTRACTION W/PHACO Left 10/21/2021   Procedure: CATARACT EXTRACTION PHACO AND INTRAOCULAR LENS PLACEMENT (IOC) LEFT 3.94 00:33.7;  Surgeon: Rosa College, MD;  Location: [facility];  Service: Ophthalmology;  Laterality: Left;   CATARACT EXTRACTION W/PHACO Right 11/09/2021   Procedure: CATARACT EXTRACTION PHACO AND INTRAOCULAR LENS PLACEMENT (IOC) RIGHT;  Surgeon:  Rosa College, MD;  Location: [facility];  Service: Ophthalmology;  Laterality: Right;  3.59 0:29.4   CORONARY ANGIOPLASTY  "several"   CORONARY ANGIOPLASTY WITH STENT PLACEMENT  2005; 12/13/2014   "2; 1"   CORONARY ARTERY BYPASS GRAFT  05/31/1994   CABG X5   CORONARY ARTERY BYPASS GRAFT  07/29/2005   CABG X3   DIALYSIS/PERMA CATHETER INSERTION N/A 09/10/2022   Procedure: DIALYSIS/PERMA CATHETER INSERTION;  Surgeon: Jackquelyn Mass, MD;  Location: [facility] INVASIVE CV LAB;  Service: Cardiovascular;  Laterality: N/A;   ESOPHAGOGASTRODUODENOSCOPY (EGD) WITH ESOPHAGEAL DILATION  05/31/1998   GREEN LIGHT LASER TURP (TRANSURETHRAL RESECTION OF PROSTATE  01/30/1999   "not cancerous"   HERNIA REPAIR     LAPAROSCOPIC CHOLECYSTECTOMY     LEFT HEART CATH AND CORS/GRAFTS ANGIOGRAPHY N/A 07/28/2017   Procedure: LEFT HEART CATH AND CORS/GRAFTS ANGIOGRAPHY;  Surgeon: Millicent Ally, MD;  Location: [facility] INVASIVE CV LAB;  Service: Cardiovascular;  Laterality: N/A;   LUNG SURGERY  05/31/1994   "S/P CABG, had to put staple in lung after it had collapsed"   MINOR REMOVAL OF PERITONEAL DIALYSIS CATHETER  10/2022   UMBILICAL HERNIA REPAIR     w/chole    Social History   Tobacco Use   Smoking status: Former    Current packs/day: 0.00    Average packs/day: 3.0 packs/day for 20.0 years (60.0 ttl pk-yrs)    Types: Cigarettes    Start date: 07/25/1966    Quit date: 07/25/1986    Years since quitting: 37.2   Smokeless tobacco: Never  Vaping Use   Vaping status: Never Used  Substance Use Topics   Alcohol use: No    Alcohol/week: 0.0 standard drinks of alcohol   Drug use: No     Medication list has been reviewed and updated.  Current Meds  Medication Sig   acetaminophen  (TYLENOL ) 650 MG CR tablet Take 1,300 mg by mouth at bedtime.   allopurinol  (ZYLOPRIM ) 100 MG tablet TAKE 1 TABLET BY MOUTH EVERYDAY AT BEDTIME   amiodarone  (PACERONE ) 400 MG tablet Take 1 tablet (400 mg total) by mouth  2 (two) times daily for 7 days, THEN 0.5 tablets (200 mg total) 2 (two) times daily for 7 days, THEN 0.5 tablets (200 mg total) daily.   aspirin  EC 81 MG tablet Take 1 tablet (81 mg total) by mouth daily. Swallow whole.   atorvastatin  (LIPITOR) 40 MG tablet TAKE 1 TABLET BY MOUTH EVERYDAY AT BEDTIME   cetirizine (ZYRTEC) 10 MG chewable tablet Chew 10 mg by mouth daily.   Cholecalciferol  (VITAMIN D -3) 125 MCG (5000 UT) TABS Take 2,000 Units by mouth daily.    doxepin  (SINEQUAN ) 25 MG capsule Take 1 capsule (25 mg total) by mouth at bedtime.   furosemide  (LASIX ) 40 MG tablet Take 40 mg by mouth daily  as needed for edema. Taking 1 1/2 tablets (60mg  total) Pt takes on Tue. Thur. Sat.and Sunday's.   levothyroxine  (SYNTHROID ) 75 MCG tablet TAKE 1 TABLET BY MOUTH EVERY DAY BEFORE BREAKFAST   metaxalone (SKELAXIN) 400 MG tablet Take 1 tablet (400 mg total) by mouth in the morning and at bedtime.   midodrine  (PROAMATINE ) 5 MG tablet Take 1 tablet (5 mg total) by mouth 3 (three) times daily with meals.   Multiple Vitamin (MULTIVITAMIN PO) Take 1 tablet by mouth daily.   nitroGLYCERIN  (NITROSTAT ) 0.4 MG SL tablet Place 0.4 mg under the tongue every 5 (five) minutes as needed for chest pain (Up to 3 times).   pantoprazole  (PROTONIX ) 40 MG tablet Take 1 tablet (40 mg total) by mouth 2 (two) times daily. Home med.   senna (SENOKOT) 8.6 MG tablet Take 1 tablet by mouth as needed for constipation.   [DISCONTINUED] gabapentin  (NEURONTIN ) 100 MG capsule Take 100 mg by mouth 2 (two) times daily as needed.       08/15/2023    1:44 PM 07/26/2023    1:58 PM 07/19/2023    9:37 AM 05/27/2023    1:46 PM  GAD 7 : Generalized Anxiety Score  Nervous, Anxious, on Edge 2 0 0 0  Control/stop worrying 0 0 1 0  Worry too much - different things 0 0 1 0  Trouble relaxing 0 0 1 0  Restless 2 0 1 0  Easily annoyed or irritable 2 0 0 2  Afraid - awful might happen 2 0 0 0  Total GAD 7 Score 8 0 4 2  Anxiety Difficulty  Not  difficult at all Not difficult at all Not difficult at all       08/15/2023    1:44 PM 07/26/2023    1:58 PM 07/19/2023    9:36 AM  Depression screen PHQ 2/9  Decreased Interest 0 0 1  Down, Depressed, Hopeless 0 0 0  PHQ - 2 Score 0 0 1  Altered sleeping 0 0 3  Tired, decreased energy 3 0 3  Change in appetite 0 0 0  Feeling bad or failure about yourself  0 0 1  Trouble concentrating 0 0 0  Moving slowly or fidgety/restless 3 0 0  Suicidal thoughts 0 0 0  PHQ-9 Score 6 0 8  Difficult doing work/chores Somewhat difficult Not difficult at all Not difficult at all    BP Readings from Last 3 Encounters:  10/07/23 110/60  10/01/23 (!) 125/58  09/05/23 112/66    Physical Exam Vitals and nursing note reviewed.  Constitutional:      General: He is not in acute distress.    Appearance: He is well-developed. He is obese.  HENT:     Head: Normocephalic and atraumatic.  Cardiovascular:     Rate and Rhythm: Normal rate. Rhythm irregular.  Pulmonary:     Effort: Pulmonary effort is normal. No respiratory distress.     Breath sounds: No wheezing or rhonchi.  Musculoskeletal:     Cervical back: Normal range of motion.     Lumbar back: No spasms or bony tenderness. Normal range of motion.     Right lower leg: No edema.     Left lower leg: No edema.  Skin:    General: Skin is warm and dry.     Findings: Wound present. No rash.          Comments: Healing skin tear with eschar  Neurological:     Mental  Status: He is alert and oriented to person, place, and time.     Motor: No weakness, tremor or abnormal muscle tone.  Psychiatric:        Mood and Affect: Mood normal.        Behavior: Behavior normal.     Wt Readings from Last 3 Encounters:  10/07/23 207 lb 4 oz (94 kg)  09/29/23 205 lb (93 kg)  09/05/23 206 lb (93.4 kg)    BP 110/60   Pulse 94   Ht 5\' 9"  (1.753 m)   Wt 207 lb 4 oz (94 kg)   SpO2 98%   BMI 30.61 kg/m   Assessment and Plan:  Problem List Items  Addressed This Visit       Unprioritized   Spinal stenosis of lumbar region without neurogenic claudication - Primary (Chronic)   Longstanding problems Pain worse with standing and bending - relieved by sitting and reclining Continue tylenol , short course of Skelaxin bid Stop gabapentin . Refer to PT; consider return to pain management      Relevant Orders   Ambulatory referral to Physical Therapy   Other Visit Diagnoses       Muscle spasm of back       Relevant Medications   metaxalone (SKELAXIN) 400 MG tablet       No follow-ups on file.    Sheron Dixons, MD [facility] [place]

## 2023-10-10 DIAGNOSIS — N186 End stage renal disease: Secondary | ICD-10-CM | POA: Diagnosis not present

## 2023-10-10 DIAGNOSIS — Z23 Encounter for immunization: Secondary | ICD-10-CM | POA: Diagnosis not present

## 2023-10-10 DIAGNOSIS — Z992 Dependence on renal dialysis: Secondary | ICD-10-CM | POA: Diagnosis not present

## 2023-10-12 DIAGNOSIS — Z992 Dependence on renal dialysis: Secondary | ICD-10-CM | POA: Diagnosis not present

## 2023-10-12 DIAGNOSIS — N186 End stage renal disease: Secondary | ICD-10-CM | POA: Diagnosis not present

## 2023-10-12 DIAGNOSIS — Z23 Encounter for immunization: Secondary | ICD-10-CM | POA: Diagnosis not present

## 2023-10-13 ENCOUNTER — Other Ambulatory Visit: Payer: Self-pay | Admitting: Internal Medicine

## 2023-10-13 DIAGNOSIS — I2510 Atherosclerotic heart disease of native coronary artery without angina pectoris: Secondary | ICD-10-CM

## 2023-10-14 ENCOUNTER — Other Ambulatory Visit: Payer: Self-pay | Admitting: Internal Medicine

## 2023-10-14 DIAGNOSIS — Z23 Encounter for immunization: Secondary | ICD-10-CM | POA: Diagnosis not present

## 2023-10-14 DIAGNOSIS — E039 Hypothyroidism, unspecified: Secondary | ICD-10-CM

## 2023-10-14 DIAGNOSIS — Z992 Dependence on renal dialysis: Secondary | ICD-10-CM | POA: Diagnosis not present

## 2023-10-14 DIAGNOSIS — N186 End stage renal disease: Secondary | ICD-10-CM | POA: Diagnosis not present

## 2023-10-14 NOTE — Telephone Encounter (Signed)
 Requested Prescriptions  Pending Prescriptions Disp Refills   atorvastatin  (LIPITOR) 40 MG tablet [Pharmacy Med Name: ATORVASTATIN  40 MG TABLET] 90 tablet 0    Sig: TAKE 1 TABLET BY MOUTH EVERYDAY AT BEDTIME     Cardiovascular:  Antilipid - Statins Failed - 10/14/2023 11:50 AM      Failed - Lipid Panel in normal range within the last 12 months    Cholesterol, Total  Date Value Ref Range Status  07/19/2023 127 100 - 199 mg/dL Final   LDL Chol Calc (NIH)  Date Value Ref Range Status  07/19/2023 65 0 - 99 mg/dL Final   HDL  Date Value Ref Range Status  07/19/2023 49 >39 mg/dL Final   Triglycerides  Date Value Ref Range Status  07/19/2023 59 0 - 149 mg/dL Final         Passed - Patient is not pregnant      Passed - Valid encounter within last 12 months    Recent Outpatient Visits           1 week ago Spinal stenosis of lumbar region without neurogenic claudication   [place] [facility] at [facility], [place], MD   1 month ago Pruritus due to systemic disorder   [facility] at [facility], [place], MD   2 months ago Iron deficiency anemia due to chronic blood loss   [facility] at [facility], [place], MD   2 months ago Acquired thrombophilia [facility])   [place] [facility] at [facility], [place], MD   2 months ago Annual physical exam   [facility] at [facility], [place], MD

## 2023-10-17 DIAGNOSIS — Z992 Dependence on renal dialysis: Secondary | ICD-10-CM | POA: Diagnosis not present

## 2023-10-17 DIAGNOSIS — Z23 Encounter for immunization: Secondary | ICD-10-CM | POA: Diagnosis not present

## 2023-10-17 DIAGNOSIS — N186 End stage renal disease: Secondary | ICD-10-CM | POA: Diagnosis not present

## 2023-10-17 NOTE — Telephone Encounter (Signed)
 Requested Prescriptions  Pending Prescriptions Disp Refills   levothyroxine  (SYNTHROID ) 75 MCG tablet [Pharmacy Med Name: LEVOTHYROXINE  75 MCG TABLET] 90 tablet 0    Sig: TAKE 1 TABLET BY MOUTH EVERY DAY BEFORE BREAKFAST     Endocrinology:  Hypothyroid Agents Failed - 10/17/2023  2:03 PM      Failed - TSH in normal range and within 360 days    TSH  Date Value Ref Range Status  08/06/2023 6.584 (H) 0.350 - 4.500 uIU/mL Final    Comment:    Performed by a 3rd Generation assay with a functional sensitivity of <=0.01 uIU/mL. Performed at [facility], [address] [place], [place] [address]   07/19/2023 8.090 (H) 0.450 - 4.500 uIU/mL Final         Passed - Valid encounter within last 12 months    Recent Outpatient Visits           1 week ago Spinal stenosis of lumbar region without neurogenic claudication   [place] [facility] at [facility], [place], MD   1 month ago Pruritus due to systemic disorder   [facility] at [facility], [place]ales [place], MD   2 months ago Iron deficiency anemia due to chronic blood loss   [facility] at [facility], [place]ales [place], MD   2 months ago Acquired thrombophilia [facility])   [place] [facility] at [facility], [place], MD   3 months ago Annual physical exam   [facility] at [facility], [place], MD

## 2023-10-18 ENCOUNTER — Encounter (INDEPENDENT_AMBULATORY_CARE_PROVIDER_SITE_OTHER): Payer: Self-pay

## 2023-10-19 DIAGNOSIS — Z23 Encounter for immunization: Secondary | ICD-10-CM | POA: Diagnosis not present

## 2023-10-19 DIAGNOSIS — Z992 Dependence on renal dialysis: Secondary | ICD-10-CM | POA: Diagnosis not present

## 2023-10-19 DIAGNOSIS — N186 End stage renal disease: Secondary | ICD-10-CM | POA: Diagnosis not present

## 2023-10-21 DIAGNOSIS — N186 End stage renal disease: Secondary | ICD-10-CM | POA: Diagnosis not present

## 2023-10-21 DIAGNOSIS — Z23 Encounter for immunization: Secondary | ICD-10-CM | POA: Diagnosis not present

## 2023-10-21 DIAGNOSIS — Z992 Dependence on renal dialysis: Secondary | ICD-10-CM | POA: Diagnosis not present

## 2023-10-24 DIAGNOSIS — Z23 Encounter for immunization: Secondary | ICD-10-CM | POA: Diagnosis not present

## 2023-10-24 DIAGNOSIS — Z992 Dependence on renal dialysis: Secondary | ICD-10-CM | POA: Diagnosis not present

## 2023-10-24 DIAGNOSIS — N186 End stage renal disease: Secondary | ICD-10-CM | POA: Diagnosis not present

## 2023-10-25 ENCOUNTER — Encounter: Payer: Self-pay | Admitting: Internal Medicine

## 2023-10-26 DIAGNOSIS — Z23 Encounter for immunization: Secondary | ICD-10-CM | POA: Diagnosis not present

## 2023-10-26 DIAGNOSIS — N186 End stage renal disease: Secondary | ICD-10-CM | POA: Diagnosis not present

## 2023-10-26 DIAGNOSIS — Z992 Dependence on renal dialysis: Secondary | ICD-10-CM | POA: Diagnosis not present

## 2023-10-28 DIAGNOSIS — Z992 Dependence on renal dialysis: Secondary | ICD-10-CM | POA: Diagnosis not present

## 2023-10-28 DIAGNOSIS — Z23 Encounter for immunization: Secondary | ICD-10-CM | POA: Diagnosis not present

## 2023-10-28 DIAGNOSIS — N186 End stage renal disease: Secondary | ICD-10-CM | POA: Diagnosis not present

## 2023-10-29 DIAGNOSIS — Z992 Dependence on renal dialysis: Secondary | ICD-10-CM | POA: Diagnosis not present

## 2023-10-29 DIAGNOSIS — N186 End stage renal disease: Secondary | ICD-10-CM | POA: Diagnosis not present

## 2023-10-31 DIAGNOSIS — Z992 Dependence on renal dialysis: Secondary | ICD-10-CM | POA: Diagnosis not present

## 2023-10-31 DIAGNOSIS — N186 End stage renal disease: Secondary | ICD-10-CM | POA: Diagnosis not present

## 2023-11-02 DIAGNOSIS — N186 End stage renal disease: Secondary | ICD-10-CM | POA: Diagnosis not present

## 2023-11-02 DIAGNOSIS — Z992 Dependence on renal dialysis: Secondary | ICD-10-CM | POA: Diagnosis not present

## 2023-11-04 DIAGNOSIS — N186 End stage renal disease: Secondary | ICD-10-CM | POA: Diagnosis not present

## 2023-11-04 DIAGNOSIS — Z992 Dependence on renal dialysis: Secondary | ICD-10-CM | POA: Diagnosis not present

## 2023-11-06 ENCOUNTER — Inpatient Hospital Stay
Admission: EM | Admit: 2023-11-06 | Discharge: 2023-11-08 | DRG: 308 | Disposition: A | Discharge status: Home Health | Attending: Internal Medicine | Admitting: Internal Medicine

## 2023-11-06 ENCOUNTER — Emergency Department

## 2023-11-06 ENCOUNTER — Other Ambulatory Visit: Payer: Self-pay

## 2023-11-06 DIAGNOSIS — I4819 Other persistent atrial fibrillation: Secondary | ICD-10-CM | POA: Diagnosis present

## 2023-11-06 DIAGNOSIS — Z87891 Personal history of nicotine dependence: Secondary | ICD-10-CM

## 2023-11-06 DIAGNOSIS — Z515 Encounter for palliative care: Secondary | ICD-10-CM

## 2023-11-06 DIAGNOSIS — J439 Emphysema, unspecified: Secondary | ICD-10-CM | POA: Diagnosis not present

## 2023-11-06 DIAGNOSIS — R55 Syncope and collapse: Secondary | ICD-10-CM | POA: Diagnosis not present

## 2023-11-06 DIAGNOSIS — E1122 Type 2 diabetes mellitus with diabetic chronic kidney disease: Secondary | ICD-10-CM | POA: Diagnosis present

## 2023-11-06 DIAGNOSIS — R000 Tachycardia, unspecified: Secondary | ICD-10-CM | POA: Diagnosis present

## 2023-11-06 DIAGNOSIS — I4892 Unspecified atrial flutter: Secondary | ICD-10-CM | POA: Diagnosis not present

## 2023-11-06 DIAGNOSIS — N2581 Secondary hyperparathyroidism of renal origin: Secondary | ICD-10-CM | POA: Diagnosis present

## 2023-11-06 DIAGNOSIS — Z825 Family history of asthma and other chronic lower respiratory diseases: Secondary | ICD-10-CM

## 2023-11-06 DIAGNOSIS — I499 Cardiac arrhythmia, unspecified: Secondary | ICD-10-CM | POA: Diagnosis not present

## 2023-11-06 DIAGNOSIS — Z955 Presence of coronary angioplasty implant and graft: Secondary | ICD-10-CM

## 2023-11-06 DIAGNOSIS — I959 Hypotension, unspecified: Secondary | ICD-10-CM | POA: Diagnosis not present

## 2023-11-06 DIAGNOSIS — I472 Ventricular tachycardia, unspecified: Principal | ICD-10-CM | POA: Diagnosis present

## 2023-11-06 DIAGNOSIS — Z555 Less than a high school diploma: Secondary | ICD-10-CM

## 2023-11-06 DIAGNOSIS — R6889 Other general symptoms and signs: Secondary | ICD-10-CM | POA: Diagnosis not present

## 2023-11-06 DIAGNOSIS — I5023 Acute on chronic systolic (congestive) heart failure: Secondary | ICD-10-CM | POA: Diagnosis present

## 2023-11-06 DIAGNOSIS — Z961 Presence of intraocular lens: Secondary | ICD-10-CM | POA: Diagnosis present

## 2023-11-06 DIAGNOSIS — Z888 Allergy status to other drugs, medicaments and biological substances status: Secondary | ICD-10-CM

## 2023-11-06 DIAGNOSIS — I420 Dilated cardiomyopathy: Secondary | ICD-10-CM | POA: Diagnosis not present

## 2023-11-06 DIAGNOSIS — Z743 Need for continuous supervision: Secondary | ICD-10-CM | POA: Diagnosis not present

## 2023-11-06 DIAGNOSIS — I252 Old myocardial infarction: Secondary | ICD-10-CM

## 2023-11-06 DIAGNOSIS — K219 Gastro-esophageal reflux disease without esophagitis: Secondary | ICD-10-CM | POA: Diagnosis present

## 2023-11-06 DIAGNOSIS — R9431 Abnormal electrocardiogram [ECG] [EKG]: Secondary | ICD-10-CM | POA: Diagnosis not present

## 2023-11-06 DIAGNOSIS — I120 Hypertensive chronic kidney disease with stage 5 chronic kidney disease or end stage renal disease: Secondary | ICD-10-CM | POA: Diagnosis not present

## 2023-11-06 DIAGNOSIS — I132 Hypertensive heart and chronic kidney disease with heart failure and with stage 5 chronic kidney disease, or end stage renal disease: Secondary | ICD-10-CM | POA: Diagnosis not present

## 2023-11-06 DIAGNOSIS — I714 Abdominal aortic aneurysm, without rupture, unspecified: Secondary | ICD-10-CM | POA: Diagnosis not present

## 2023-11-06 DIAGNOSIS — R197 Diarrhea, unspecified: Secondary | ICD-10-CM | POA: Diagnosis present

## 2023-11-06 DIAGNOSIS — Z9079 Acquired absence of other genital organ(s): Secondary | ICD-10-CM

## 2023-11-06 DIAGNOSIS — R0602 Shortness of breath: Secondary | ICD-10-CM | POA: Diagnosis not present

## 2023-11-06 DIAGNOSIS — Z9841 Cataract extraction status, right eye: Secondary | ICD-10-CM

## 2023-11-06 DIAGNOSIS — E875 Hyperkalemia: Secondary | ICD-10-CM | POA: Diagnosis not present

## 2023-11-06 DIAGNOSIS — Z9842 Cataract extraction status, left eye: Secondary | ICD-10-CM

## 2023-11-06 DIAGNOSIS — I255 Ischemic cardiomyopathy: Secondary | ICD-10-CM | POA: Diagnosis present

## 2023-11-06 DIAGNOSIS — I2510 Atherosclerotic heart disease of native coronary artery without angina pectoris: Secondary | ICD-10-CM | POA: Diagnosis not present

## 2023-11-06 DIAGNOSIS — Z79899 Other long term (current) drug therapy: Secondary | ICD-10-CM

## 2023-11-06 DIAGNOSIS — Z951 Presence of aortocoronary bypass graft: Secondary | ICD-10-CM | POA: Diagnosis not present

## 2023-11-06 DIAGNOSIS — Z880 Allergy status to penicillin: Secondary | ICD-10-CM

## 2023-11-06 DIAGNOSIS — Z79890 Hormone replacement therapy (postmenopausal): Secondary | ICD-10-CM | POA: Diagnosis not present

## 2023-11-06 DIAGNOSIS — Z823 Family history of stroke: Secondary | ICD-10-CM

## 2023-11-06 DIAGNOSIS — Z881 Allergy status to other antibiotic agents status: Secondary | ICD-10-CM

## 2023-11-06 DIAGNOSIS — D631 Anemia in chronic kidney disease: Secondary | ICD-10-CM | POA: Diagnosis not present

## 2023-11-06 DIAGNOSIS — H9192 Unspecified hearing loss, left ear: Secondary | ICD-10-CM | POA: Diagnosis present

## 2023-11-06 DIAGNOSIS — Z7982 Long term (current) use of aspirin: Secondary | ICD-10-CM

## 2023-11-06 DIAGNOSIS — E785 Hyperlipidemia, unspecified: Secondary | ICD-10-CM | POA: Diagnosis not present

## 2023-11-06 DIAGNOSIS — Z992 Dependence on renal dialysis: Principal | ICD-10-CM

## 2023-11-06 DIAGNOSIS — N186 End stage renal disease: Secondary | ICD-10-CM | POA: Diagnosis not present

## 2023-11-06 DIAGNOSIS — Z8249 Family history of ischemic heart disease and other diseases of the circulatory system: Secondary | ICD-10-CM

## 2023-11-06 DIAGNOSIS — Z83438 Family history of other disorder of lipoprotein metabolism and other lipidemia: Secondary | ICD-10-CM

## 2023-11-06 DIAGNOSIS — I517 Cardiomegaly: Secondary | ICD-10-CM | POA: Diagnosis not present

## 2023-11-06 DIAGNOSIS — R109 Unspecified abdominal pain: Secondary | ICD-10-CM | POA: Diagnosis not present

## 2023-11-06 DIAGNOSIS — R0989 Other specified symptoms and signs involving the circulatory and respiratory systems: Secondary | ICD-10-CM | POA: Diagnosis not present

## 2023-11-06 DIAGNOSIS — N281 Cyst of kidney, acquired: Secondary | ICD-10-CM | POA: Diagnosis not present

## 2023-11-06 LAB — COMPREHENSIVE METABOLIC PANEL WITH GFR
ALT: 28 U/L (ref 0–44)
AST: 44 U/L — ABNORMAL HIGH (ref 15–41)
Albumin: 3.4 g/dL — ABNORMAL LOW (ref 3.5–5.0)
Alkaline Phosphatase: 167 U/L — ABNORMAL HIGH (ref 38–126)
Anion gap: 13 (ref 5–15)
BUN: 48 mg/dL — ABNORMAL HIGH (ref 8–23)
CO2: 23 mmol/L (ref 22–32)
Calcium: 9.2 mg/dL (ref 8.9–10.3)
Chloride: 100 mmol/L (ref 98–111)
Creatinine, Ser: 6.46 mg/dL — ABNORMAL HIGH (ref 0.61–1.24)
GFR, Estimated: 8 mL/min — ABNORMAL LOW (ref >60)
Glucose, Bld: 135 mg/dL — ABNORMAL HIGH (ref 70–99)
Potassium: 6.3 mmol/L (ref 3.5–5.1)
Sodium: 136 mmol/L (ref 135–145)
Total Bilirubin: 1.5 mg/dL — ABNORMAL HIGH (ref 0.0–1.2)
Total Protein: 6.9 g/dL (ref 6.5–8.1)

## 2023-11-06 LAB — CBG MONITORING, ED: Glucose-Capillary: 119 mg/dL — ABNORMAL HIGH (ref 70–99)

## 2023-11-06 LAB — TROPONIN I (HIGH SENSITIVITY)
Troponin I (High Sensitivity): 25 ng/L — ABNORMAL HIGH (ref <18)
Troponin I (High Sensitivity): 32 ng/L — ABNORMAL HIGH (ref <18)

## 2023-11-06 LAB — CBC WITH DIFFERENTIAL/PLATELET
Abs Immature Granulocytes: 0.01 10*3/uL (ref 0.00–0.07)
Basophils Absolute: 0.1 10*3/uL (ref 0.0–0.1)
Basophils Relative: 1 %
Eosinophils Absolute: 0.2 10*3/uL (ref 0.0–0.5)
Eosinophils Relative: 3 %
HCT: 29.2 % — ABNORMAL LOW (ref 39.0–52.0)
Hemoglobin: 9.4 g/dL — ABNORMAL LOW (ref 13.0–17.0)
Immature Granulocytes: 0 %
Lymphocytes Relative: 8 %
Lymphs Abs: 0.4 10*3/uL — ABNORMAL LOW (ref 0.7–4.0)
MCH: 30.3 pg (ref 26.0–34.0)
MCHC: 32.2 g/dL (ref 30.0–36.0)
MCV: 94.2 fL (ref 80.0–100.0)
Monocytes Absolute: 0.5 10*3/uL (ref 0.1–1.0)
Monocytes Relative: 10 %
Neutro Abs: 4.3 10*3/uL (ref 1.7–7.7)
Neutrophils Relative %: 78 %
Platelets: 89 10*3/uL — ABNORMAL LOW (ref 150–400)
RBC: 3.1 MIL/uL — ABNORMAL LOW (ref 4.22–5.81)
RDW: 16.9 % — ABNORMAL HIGH (ref 11.5–15.5)
WBC: 5.4 10*3/uL (ref 4.0–10.5)
nRBC: 0 % (ref 0.0–0.2)

## 2023-11-06 LAB — LIPASE, BLOOD: Lipase: 32 U/L (ref 11–51)

## 2023-11-06 LAB — GLUCOSE, RANDOM: Glucose, Bld: 195 mg/dL — ABNORMAL HIGH (ref 70–99)

## 2023-11-06 LAB — MAGNESIUM: Magnesium: 2.8 mg/dL — ABNORMAL HIGH (ref 1.7–2.4)

## 2023-11-06 MED ORDER — DEXTROSE 50 % IV SOLN
1.0000 | Freq: Once | INTRAVENOUS | Status: AC
  Administered 2023-11-06: 50 mL via INTRAVENOUS
  Filled 2023-11-06: qty 50

## 2023-11-06 MED ORDER — CALCIUM GLUCONATE-NACL 1-0.675 GM/50ML-% IV SOLN
1.0000 g | Freq: Once | INTRAVENOUS | Status: AC
  Administered 2023-11-06: 1000 mg via INTRAVENOUS
  Filled 2023-11-06: qty 50

## 2023-11-06 MED ORDER — SODIUM BICARBONATE 8.4 % IV SOLN
50.0000 meq | Freq: Once | INTRAVENOUS | Status: AC
  Administered 2023-11-06: 50 meq via INTRAVENOUS
  Filled 2023-11-06: qty 50

## 2023-11-06 MED ORDER — SODIUM ZIRCONIUM CYCLOSILICATE 10 G PO PACK
10.0000 g | PACK | Freq: Once | ORAL | Status: AC
  Administered 2023-11-06: 10 g via ORAL
  Filled 2023-11-06: qty 1

## 2023-11-06 MED ORDER — AMIODARONE HCL 200 MG PO TABS
200.0000 mg | ORAL_TABLET | Freq: Every day | ORAL | Status: DC
  Administered 2023-11-06 – 2023-11-07 (×2): 200 mg via ORAL
  Filled 2023-11-06 (×2): qty 1

## 2023-11-06 MED ORDER — CHLORHEXIDINE GLUCONATE CLOTH 2 % EX PADS
6.0000 | MEDICATED_PAD | Freq: Every day | CUTANEOUS | Status: DC
  Administered 2023-11-08: 6 via TOPICAL
  Filled 2023-11-06: qty 6

## 2023-11-06 MED ORDER — MIDODRINE HCL 5 MG PO TABS
5.0000 mg | ORAL_TABLET | Freq: Once | ORAL | Status: AC
  Administered 2023-11-06: 5 mg via ORAL
  Filled 2023-11-06: qty 1

## 2023-11-06 MED ORDER — INSULIN ASPART 100 UNIT/ML IV SOLN
5.0000 [IU] | Freq: Once | INTRAVENOUS | Status: AC
  Administered 2023-11-06: 5 [IU] via INTRAVENOUS
  Filled 2023-11-06: qty 0.05

## 2023-11-06 MED ORDER — HEPARIN SODIUM (PORCINE) 1000 UNIT/ML DIALYSIS
1000.0000 [IU] | INTRAMUSCULAR | Status: DC | PRN
  Administered 2023-11-06: 3300 [IU]

## 2023-11-06 NOTE — Progress Notes (Signed)
 Received patient in bed .  Alert and oriented. X3-4 Informed consent signed and in chart.   TX duration:2.5 hous  Patient tolerated well.  Alert, without acute distress.  Hand-off given to patient's nurse.   Access used: dialysis cath Access issues: none  Total UF removed: 1200 Medication(s) given: heplock 1.6 arterial 1.7 venous Post HD VS: see table below Post HD weight: 97.6kg   11/06/23 2245  Vitals  Temp (!) 97.2 F (36.2 C)  Temp Source Oral  BP 127/63  MAP (mmHg) 83  BP Location Right Arm  BP Method Automatic  Patient Position (if appropriate) Lying  Pulse Rate 94  Pulse Rate Source Monitor  ECG Heart Rate 93  Resp 14  Oxygen Therapy  SpO2 100 %  O2 Device Room Air  Patient Activity (if Appropriate) In bed  Pulse Oximetry Type Continuous  During Treatment Monitoring  Blood Flow Rate (mL/min) 0 mL/min  Arterial Pressure (mmHg) 14.54 mmHg  Venous Pressure (mmHg) -472.7 mmHg  TMP (mmHg) 11.31 mmHg  Ultrafiltration Rate (mL/min) 1012 mL/min  Dialysate Flow Rate (mL/min) 299 ml/min  Dialysate Potassium Concentration 2  Dialysate Calcium  Concentration 2.5  Duration of HD Treatment -hour(s) 2.5 hour(s)  Cumulative Fluid Removed (mL) per Treatment  1200.16  HD Safety Checks Performed Yes  Intra-Hemodialysis Comments Tolerated well  Post Treatment  Dialyzer Clearance Clear  Hemodialysis Intake (mL) 0 mL  Liters Processed 60  Fluid Removed (mL) 1200 mL  Tolerated HD Treatment Yes  Post-Hemodialysis Comments goal met  Hemodialysis Catheter Right Subclavian Double lumen Permanent (Tunneled)  Placement Date/Time: 09/10/22 1555   Serial / Lot #: [id number]  Expiration Date: 10/05/26  Time Out: Correct patient;Correct site;Correct procedure  Maximum sterile barrier precautions: Hand hygiene;Cap;Mask;Sterile gown;Sterile gloves;Large sterile ...  Site Condition No complications  Blue Lumen Status Flushed;Heparin  locked;Dead end cap in place  Red Lumen Status  Flushed;Heparin  locked;Dead end cap in place  Purple Lumen Status N/A  Catheter fill solution Heparin  1000 units/ml  Catheter fill volume (Arterial) 1.6 cc  Catheter fill volume (Venous) 1.7  Post treatment catheter status Capped and Clamped      [facility]

## 2023-11-06 NOTE — Progress Notes (Signed)
 Referring Provider: No ref. provider found Primary Care Physician:  Sheron Dixons, MD Primary Nephrologist:  Dr.   Lea Primmer for Consultation:    HPI: 78 year old male with history of hypertension, coronary artery disease, atrial fibrillation, congestive heart failure, history of nonsustained V. tach and end-stage renal disease on hemodialysis now comes to the emergency room with history of presyncopal episode.  He was found to have hyperkalemia of 6.3.  He received the hyperkalemia protocol.  Past Medical History:  Diagnosis Date   AAA (abdominal aortic aneurysm) (HCC)    a. 3cm by US  2015; b. 07/2023 Ao U/S: Abd Ao 2.6cm.   Arthritis    "hips; back" (12/13/2014)   CAD (coronary artery disease)    a. 1996 s/p CABGx4: LIMA-LAD, VG-Cx, VG-RCA, VG-diag; b. 2007 s/p redo CABGx2: VG-OM, VG-RCA due to VG disease; c. NSTEMI 11/2014 s/p DES to SVG-OM from the Y graft; d. 09/2015 PCI: distal body of SVG-Diag; e. 07/2017 Cath: LAD 100p, LCX 100p, RCA 95ost/100p, VG->dRCA 10p ISR, 30p/m/d, Y graft->OM3 (ok) & D2 57m, patent distal stent, LIMA->LAD ok, VG->OM2 100, VG->RPAV 100-->Med rx.   Chronic combined systolic and diastolic CHF (congestive heart failure) (HCC)    a. remote EF 40-45% in 2006. b. Normal EF 2014. c. Echo 07/2016 EF 45-50%, grade 1 DD. d. Echo 2020 30% to 35%; e. 03/2023 Echo: EF 20-25%, glob HK, inf wall best preserved. Mildly reduced RV fxn, RVSP 39.67mmHg. Mod dil LA. Mod MR/TR. AoV sclerosis.   Chronic lower back pain    CKD (chronic kidney disease), stage IV (HCC)    COPD (chronic obstructive pulmonary disease) (HCC)    Deafness in left ear    Degenerative disc disease, lumbar    Diabetes mellitus, type 2 (HCC) 10/04/2014   Microalbumin 05/11/2012-100. Foot exam/monofilament 05/11/2012-normal.   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 CHA2DS2VASc = 6-->eliquis .  On Amio.   PVC's (premature ventricular contractions)    Renal artery stenosis (HCC)    a. noted on CT 2008.   Type II diabetes mellitus (HCC)    Diet control    Unstable angina (HCC) 07/27/2017   Walking pneumonia 1990's   Wears dentures    full upper    Past Surgical History:  Procedure Laterality Date   CARDIAC CATHETERIZATION  "several"   CARDIAC CATHETERIZATION N/A 12/13/2014   Procedure: Left Heart Cath and Coronary Angiography;  Surgeon: Lucendia Rusk, MD;  Location: [facility] INVASIVE CV LAB;  Service: Cardiovascular;  Laterality: N/A;   CARDIAC CATHETERIZATION  12/13/2014   Procedure: Coronary Stent Intervention;  Surgeon: Lucendia Rusk, MD;  Location: [facility] INVASIVE CV LAB;  Service: Cardiovascular;;   CARDIAC CATHETERIZATION N/A 10/07/2015   Procedure: Left Heart Cath and Cors/Grafts Angiography;  Surgeon: Odie Benne, MD;  Location: [facility] INVASIVE CV LAB;  Service: Cardiovascular;  Laterality: N/A;   CARDIAC CATHETERIZATION N/A 10/07/2015   Procedure: Coronary Stent Intervention;  Surgeon: Coy Ditty  Abel Hoe, MD;  Location: [facility] INVASIVE CV LAB;  Service: Cardiovascular;  Laterality: N/A;   CARDIOVERSION N/A 05/25/2022   Procedure: CARDIOVERSION;  Surgeon: Jerryl Morin, DO;  Location: [facility] ENDOSCOPY;  Service: Cardiovascular;  Laterality: N/A;   CATARACT EXTRACTION W/PHACO Left 10/21/2021   Procedure: CATARACT EXTRACTION PHACO AND INTRAOCULAR LENS PLACEMENT (IOC) LEFT 3.94 00:33.7;  Surgeon: Rosa College, MD;  Location: [facility];  Service:  Ophthalmology;  Laterality: Left;   CATARACT EXTRACTION W/PHACO Right 11/09/2021   Procedure: CATARACT EXTRACTION PHACO AND INTRAOCULAR LENS PLACEMENT (IOC) RIGHT;  Surgeon: Rosa College, MD;  Location: [facility];  Service: Ophthalmology;  Laterality: Right;  3.59 0:29.4   CORONARY ANGIOPLASTY  "several"   CORONARY ANGIOPLASTY WITH STENT PLACEMENT  2005; 12/13/2014   "2; 1"   CORONARY ARTERY BYPASS GRAFT  05/31/1994   CABG X5   CORONARY ARTERY BYPASS GRAFT  07/29/2005   CABG X3   DIALYSIS/PERMA CATHETER INSERTION N/A 09/10/2022   Procedure: DIALYSIS/PERMA CATHETER INSERTION;  Surgeon: Jackquelyn Mass, MD;  Location: [facility] INVASIVE CV LAB;  Service: Cardiovascular;  Laterality: N/A;   ESOPHAGOGASTRODUODENOSCOPY (EGD) WITH ESOPHAGEAL DILATION  05/31/1998   GREEN LIGHT LASER TURP (TRANSURETHRAL RESECTION OF PROSTATE  01/30/1999   "not cancerous"   HERNIA REPAIR     LAPAROSCOPIC CHOLECYSTECTOMY     LEFT HEART CATH AND CORS/GRAFTS ANGIOGRAPHY N/A 07/28/2017   Procedure: LEFT HEART CATH AND CORS/GRAFTS ANGIOGRAPHY;  Surgeon: Millicent Ally, MD;  Location: [facility] INVASIVE CV LAB;  Service: Cardiovascular;  Laterality: N/A;   LUNG SURGERY  05/31/1994   "S/P CABG, had to put staple in lung after it had collapsed"   MINOR REMOVAL OF PERITONEAL DIALYSIS CATHETER  10/2022   UMBILICAL HERNIA REPAIR     w/chole    Prior to Admission medications   Medication Sig Start Date End Date Taking? Authorizing Provider  acetaminophen  (TYLENOL ) 650 MG CR tablet Take 1,300 mg by mouth at bedtime.    [provider]  allopurinol  (ZYLOPRIM ) 100 MG tablet TAKE 1 TABLET BY MOUTH EVERYDAY AT BEDTIME 07/08/23   Sheron Dixons, MD  amiodarone  (PACERONE ) 400 MG tablet Take 1 tablet (400 mg total) by mouth 2 (two) times daily for 7 days, THEN 0.5 tablets (200 mg total) 2 (two) times daily for 7 days, THEN 0.5 tablets (200 mg total) daily. 10/01/23 11/14/23  Jodeane Mulligan, DO  aspirin  EC 81 MG  tablet Take 1 tablet (81 mg total) by mouth daily. Swallow whole. 10/02/23   Jodeane Mulligan, DO  atorvastatin  (LIPITOR) 40 MG tablet TAKE 1 TABLET BY MOUTH EVERYDAY AT BEDTIME 10/14/23   Berglund, Laura H, MD  cetirizine (ZYRTEC) 10 MG chewable tablet Chew 10 mg by mouth daily.    [provider]  Cholecalciferol  (VITAMIN D -3) 125 MCG (5000 UT) TABS Take 2,000 Units by mouth daily.     [provider]  doxepin  (SINEQUAN ) 25 MG capsule Take 1 capsule (25 mg total) by mouth at bedtime. 09/30/23   Sheron Dixons, MD  furosemide  (LASIX ) 40 MG tablet Take 40 mg by mouth daily as needed for edema. Taking 1 1/2 tablets (60mg  total) Pt takes on Tue. Thur. Sat.and Sunday's. 10/12/22   [provider]  levothyroxine  (SYNTHROID ) 75 MCG tablet TAKE 1 TABLET BY MOUTH EVERY DAY BEFORE BREAKFAST 10/17/23   Sheron Dixons, MD  metaxalone  (SKELAXIN ) 400 MG tablet Take 1 tablet (400 mg total) by mouth in the morning and at bedtime. 10/07/23  Sheron Dixons, MD  midodrine  (PROAMATINE ) 5 MG tablet Take 1 tablet (5 mg total) by mouth 3 (three) times daily with meals. 08/09/23   Wouk, Haynes Lips, MD  Multiple Vitamin (MULTIVITAMIN PO) Take 1 tablet by mouth daily.    [provider]  nitroGLYCERIN  (NITROSTAT ) 0.4 MG SL tablet Place 0.4 mg under the tongue every 5 (five) minutes as needed for chest pain (Up to 3 times).    [provider]  pantoprazole  (PROTONIX ) 40 MG tablet Take 1 tablet (40 mg total) by mouth 2 (two) times daily. Home med. 07/23/23   Sheron Dixons, MD  senna (SENOKOT) 8.6 MG tablet Take 1 tablet by mouth as needed for constipation.    [provider]    Current Facility-Administered Medications  Medication Dose Route Frequency Provider Last Rate Last Admin   [START ON 11/07/2023] Chlorhexidine  Gluconate Cloth 2 % PADS 6 each  6 each Topical Q0600 Syrenity Klepacki, MD       Current Outpatient Medications  Medication Sig Dispense Refill    acetaminophen  (TYLENOL ) 650 MG CR tablet Take 1,300 mg by mouth at bedtime.     allopurinol  (ZYLOPRIM ) 100 MG tablet TAKE 1 TABLET BY MOUTH EVERYDAY AT BEDTIME 90 tablet 1   amiodarone  (PACERONE ) 400 MG tablet Take 1 tablet (400 mg total) by mouth 2 (two) times daily for 7 days, THEN 0.5 tablets (200 mg total) 2 (two) times daily for 7 days, THEN 0.5 tablets (200 mg total) daily. 36 tablet 1   aspirin  EC 81 MG tablet Take 1 tablet (81 mg total) by mouth daily. Swallow whole. 30 tablet 12   atorvastatin  (LIPITOR) 40 MG tablet TAKE 1 TABLET BY MOUTH EVERYDAY AT BEDTIME 90 tablet 1   cetirizine (ZYRTEC) 10 MG chewable tablet Chew 10 mg by mouth daily.     Cholecalciferol  (VITAMIN D -3) 125 MCG (5000 UT) TABS Take 2,000 Units by mouth daily.      doxepin  (SINEQUAN ) 25 MG capsule Take 1 capsule (25 mg total) by mouth at bedtime. 30 capsule 0   furosemide  (LASIX ) 40 MG tablet Take 40 mg by mouth daily as needed for edema. Taking 1 1/2 tablets (60mg  total) Pt takes on Tue. Thur. Sat.and Sunday's.     levothyroxine  (SYNTHROID ) 75 MCG tablet TAKE 1 TABLET BY MOUTH EVERY DAY BEFORE BREAKFAST 90 tablet 0   metaxalone  (SKELAXIN ) 400 MG tablet Take 1 tablet (400 mg total) by mouth in the morning and at bedtime. 30 tablet 0   midodrine  (PROAMATINE ) 5 MG tablet Take 1 tablet (5 mg total) by mouth 3 (three) times daily with meals. 90 tablet 1   Multiple Vitamin (MULTIVITAMIN PO) Take 1 tablet by mouth daily.     nitroGLYCERIN  (NITROSTAT ) 0.4 MG SL tablet Place 0.4 mg under the tongue every 5 (five) minutes as needed for chest pain (Up to 3 times).     pantoprazole  (PROTONIX ) 40 MG tablet Take 1 tablet (40 mg total) by mouth 2 (two) times daily. Home med. 180 tablet 1   senna (SENOKOT) 8.6 MG tablet Take 1 tablet by mouth as needed for constipation.      Allergies as of 11/06/2023 - Review Complete 11/06/2023  Allergen Reaction Noted   Predicort [prednisolone] Other (See Comments) 07/07/2017   Ciprofloxacin Other  (See Comments) 04/18/2014   Hydrochlorothiazide Other (See Comments) 08/15/2008   Hydrocodone Nausea Only and Other (See Comments) 08/15/2008   Hydrocodone-acetaminophen  Nausea Only 08/15/2008   Sulfa antibiotics Other (See Comments) 10/04/2014  Penicillins Hives, Rash, and Other (See Comments) 08/15/2008    Family History  Problem Relation Age of Onset   Heart attack Mother        MI   Stroke Mother    Heart disease Mother    Hypertension Mother    Hyperlipidemia Mother    Asthma Mother    Heart disease Father    Rheumatic fever Father    Colon cancer Neg Hx     Social History   Socioeconomic History   Marital status: Married    Spouse name: Rannie Craney   Number of children: 2   Years of education: Not on file   Highest education level: 8th grade  Occupational History   Occupation: Retired  Tobacco Use   Smoking status: Former    Current packs/day: 0.00    Average packs/day: 3.0 packs/day for 20.0 years (60.0 ttl pk-yrs)    Types: Cigarettes    Start date: 07/25/1966    Quit date: 07/25/1986    Years since quitting: 37.3   Smokeless tobacco: Never  Vaping Use   Vaping status: Never Used  Substance and Sexual Activity   Alcohol use: No    Alcohol/week: 0.0 standard drinks of alcohol   Drug use: No   Sexual activity: Not Currently  Other Topics Concern   Not on file  Social History Narrative   Did auto salvage work.   Lives at home with his wife.  Independent at baseline.   Social Drivers of Corporate investment banker Strain: Low Risk  (06/23/2023)   Overall Financial Resource Strain (CARDIA)    Difficulty of Paying Living Expenses: Not hard at all  Food Insecurity: No Food Insecurity (09/30/2023)   Hunger Vital Sign    Worried About Running Out of Food in the Last Year: Never true    Ran Out of Food in the Last Year: Never true  Transportation Needs: No Transportation Needs (09/30/2023)   PRAPARE - Administrator, Civil Service (Medical): No     Lack of Transportation (Non-Medical): No  Physical Activity: Inactive (06/23/2023)   Exercise Vital Sign    Days of Exercise per Week: 0 days    Minutes of Exercise per Session: 0 min  Stress: No Stress Concern Present (06/23/2023)   [name] of Occupational Health - Occupational Stress Questionnaire    Feeling of Stress : Only a little  Social Connections: Moderately Isolated (09/30/2023)   Social Connection and Isolation Panel [NHANES]    Frequency of Communication with Friends and Family: Twice a week    Frequency of Social Gatherings with Friends and Family: Three times a week    Attends Religious Services: Never    Active Member of Clubs or Organizations: No    Attends Banker Meetings: Never    Marital Status: Married  Catering manager Violence: Not At Risk (09/30/2023)   Humiliation, Afraid, Rape, and Kick questionnaire    Fear of Current or Ex-Partner: No    Emotionally Abused: No    Physically Abused: No    Sexually Abused: No    Physical Exam: Vital signs in last 24 hours: Temp:  [97.6 F (36.4 C)] 97.6 F (36.4 C) (06/08 1708) Pulse Rate:  [110] 110 (06/08 1703) Resp:  [20] 20 (06/08 1703) BP: (103)/(60) 103/60 (06/08 1703) SpO2:  [100 %] 100 % (06/08 1703) Weight:  [97.4 kg] 97.4 kg (06/08 1708)   Intake/Output from previous day: No intake/output data recorded. Intake/Output this shift:  No intake/output data recorded.  Lab Results: Recent Labs    11/06/23 1706  WBC 5.4  HGB 9.4*  HCT 29.2*  PLT 89*   BMET Recent Labs    11/06/23 1706  NA 136  K 6.3*  CL 100  CO2 23  GLUCOSE 135*  BUN 48*  CREATININE 6.46*  CALCIUM  9.2   LFT Recent Labs    11/06/23 1706  PROT 6.9  ALBUMIN 3.4*  AST 44*  ALT 28  ALKPHOS 167*  BILITOT 1.5*   PT/INR No results for input(s): "LABPROT", "INR" in the last 72 hours. Hepatitis Panel No results for input(s): "HEPBSAG", "HCVAB", "HEPAIGM", "HEPBIGM" in the last 72  hours.  Studies/Results: CT ABDOMEN PELVIS WO CONTRAST Result Date: 11/06/2023 CLINICAL DATA:  Abdominal pain postop EXAM: CT ABDOMEN AND PELVIS WITHOUT CONTRAST TECHNIQUE: Multidetector CT imaging of the abdomen and pelvis was performed following the standard protocol without IV contrast. RADIATION DOSE REDUCTION: This exam was performed according to the departmental dose-optimization program which includes automated exposure control, adjustment of the mA and/or kV according to patient size and/or use of iterative reconstruction technique. COMPARISON:  09/13/2022 FINDINGS: Lower chest: Cardiomegaly, aortic atherosclerosis. Scarring at the left lung base. No effusions. Hepatobiliary: No focal liver abnormality is seen. Status post cholecystectomy. No biliary dilatation. Pancreas: No focal abnormality or ductal dilatation. Spleen: No focal abnormality.  Normal size. Adrenals/Urinary Tract: Adrenal glands normal. Atrophic kidneys with cortical thinning bilaterally. Bilateral renal cysts are stable. No follow-up imaging recommended. No hydronephrosis. Urinary bladder decompressed, grossly unremarkable. Stomach/Bowel: Stomach, large and small bowel grossly unremarkable. Vascular/Lymphatic: Heavily calcified aorta, iliac vessels and branch vessels. No evidence of aneurysm or adenopathy. Reproductive: No visible focal abnormality. Other: Small amount of free fluid in the abdomen and pelvis, possibly related to prior peritoneal dialysis catheter which has been recently removed. No free air. Musculoskeletal: No acute bony abnormality. IMPRESSION: Interval removal of previously seen peritoneal dialysis catheter. Small amount of residual free fluid likely related to prior PD catheter. No acute findings. Aortoiliac atherosclerosis. Electronically Signed   By: Janeece Mechanic M.D.   On: 11/06/2023 17:35   DG Chest Portable 1 View Result Date: 11/06/2023 CLINICAL DATA:  Shortness of breath EXAM: PORTABLE CHEST 1 VIEW  COMPARISON:  08/05/2023 FINDINGS: Right dialysis catheter remains in place, unchanged. Prior CABG. Cardiomegaly, vascular congestion. Diffuse interstitial prominence could reflect interstitial edema. No effusions or acute bony abnormality. IMPRESSION: Cardiomegaly with vascular congestion and possible early interstitial edema. Electronically Signed   By: Janeece Mechanic M.D.   On: 11/06/2023 17:32    Assessment/Plan:   78 year old male with history of hypertension, coronary artery disease, atrial fibrillation, congestive heart failure, history of nonsustained V. tach and end-stage renal disease on hemodialysis now comes to the emergency room with history of presyncopal episode.  He was found to have hyperkalemia of 6.3.  He received the hyperkalemia protocol.  ESRD: Dialysis tonight for 2 and half hours.  Will use 2K bath.  Consent was obtained for dialysis.  ANEMIA: Continue anemia protocol.  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  HTN/VOL: Blood pressure is well-controlled.  .  Hyperkalemia: Status post hyperkalemia protocol.  Will dialyze patient on 2K bath.  Labs and medications reviewed. Will continue to monitor closely.    LOS: 0 Worthy Heads, MD [facility] @TODAY @6 :34 PM

## 2023-11-06 NOTE — ED Notes (Signed)
 Report given to Dialysis RN, Cortland Ding.

## 2023-11-06 NOTE — ED Notes (Signed)
 Dialysis at bedside

## 2023-11-06 NOTE — ED Provider Notes (Signed)
 [facility] Provider Note    Event Date/Time   First MD Initiated Contact with Patient 11/06/23 1705     (approximate)   History   Hypotension and Tachycardia   HPI  Ronald [name] is a 78 y.o. male coronary artery disease status post CABG X2, chronic combined systolic and diastolic CHF, ESRD GERD, COPD, type 2 diabetes mellitus and CAD who comes in with concerns for hypotension and tachycardia  I reviewed a note from 09/29/2023 where patient had a history of nonsustained ventricular tachycardia and patient was treated with IV amiodarone  and transition to p.o. amiodarone  for discharge.  Patient has a known EF of 20 to 25% and he does take midodrine .  He also is end-stage renal disease on dialysis Monday Wednesday Friday.  He is got chronic A-fib and a flutter who is only on aspirin  81 mg daily due to issues with hemodialysis site bleeding.  Patient reports that he had some abdominal pain in the upper abdomen that started this morning.  He states that he went to the bathroom later this afternoon and had an episode of diarrhea.  He states that when he tried to stand up that he felt like he was going to pass out but he did not fall back did not hit his head.  Family was able to help him back into the chair.  Patient states that his abdominal pain is now mostly resolved.  His heart rates were elevated into the 120s 130s so patient was given 5 of Cardizem .  With EMS.    Physical Exam   Triage Vital Signs: ED Triage Vitals [11/06/23 1703]  Encounter Vitals Group     BP 103/60     Systolic BP Percentile      Diastolic BP Percentile      Pulse Rate (!) 110     Resp 20     Temp      Temp src      SpO2 100 %     Weight      Height      Head Circumference      Peak Flow      Pain Score 0     Pain Loc      Pain Education      Exclude from Growth Chart     Most recent vital signs: Vitals:   11/06/23 1703  BP: 103/60  Pulse: (!) 110  Resp: 20  SpO2: 100%      General: Awake, no distress.  CV:  Good peripheral perfusion.  Resp:  Normal effort.  Abd:  No distention.  Soft nontender Other:  Dialysis catheter noted   ED Results / Procedures / Treatments   Labs (all labs ordered are listed, but only abnormal results are displayed) Labs Reviewed  CBC WITH DIFFERENTIAL/PLATELET - Abnormal; Notable for the following components:      Result Value   RBC 3.10 (*)    Hemoglobin 9.4 (*)    HCT 29.2 (*)    RDW 16.9 (*)    Platelets 89 (*)    Lymphs Abs 0.4 (*)    All other components within normal limits  COMPREHENSIVE METABOLIC PANEL WITH GFR - Abnormal; Notable for the following components:   Potassium 6.3 (*)    Glucose, Bld 135 (*)    BUN 48 (*)    Creatinine, Ser 6.46 (*)    Albumin 3.4 (*)    AST 44 (*)    Alkaline Phosphatase 167 (*)  Total Bilirubin 1.5 (*)    GFR, Estimated 8 (*)    All other components within normal limits  MAGNESIUM  - Abnormal; Notable for the following components:   Magnesium  2.8 (*)    All other components within normal limits  TROPONIN I (HIGH SENSITIVITY) - Abnormal; Notable for the following components:   Troponin I (High Sensitivity) 25 (*)    All other components within normal limits  LIPASE, BLOOD  GLUCOSE, RANDOM     EKG  My interpretation of EKG:  Atrial tachycardia with a rate of 110, no ST elevation, inferior lateral T wave inversions  Widened QRS atrial tachycardia with a rate of 116 with similar inversions.  Difficult to tell if this is in A-fib with aberrancy versus a slow V. Tach  Repeat EKG after calcium  is similar with widened QRS with a rate of 115 without any ST elevations or T wave inversions, normal intervals   RADIOLOGY I have reviewed the xray personally and interpreted possibly some mild edema   PROCEDURES:  Critical Care performed: Yes, see critical care procedure note(s)  .1-3 Lead EKG Interpretation  Performed by: Lubertha Rush, MD Authorized by: Lubertha Rush, MD     Interpretation: abnormal     ECG rate:  110   ECG rate assessment: tachycardic     Rhythm: other rhythm     Ectopy: none     Conduction: normal   .Critical Care  Performed by: Lubertha Rush, MD Authorized by: Lubertha Rush, MD   Critical care provider statement:    Critical care time (minutes):  75   Critical care was necessary to treat or prevent imminent or life-threatening deterioration of the following conditions:  Cardiac failure   Critical care was time spent personally by me on the following activities:  Development of treatment plan with patient or surrogate, discussions with consultants, evaluation of patient's response to treatment, examination of patient, ordering and review of laboratory studies, ordering and review of radiographic studies, ordering and performing treatments and interventions, pulse oximetry, re-evaluation of patient's condition and review of old charts    MEDICATIONS ORDERED IN ED: Medications  sodium zirconium cyclosilicate  (LOKELMA ) packet 10 g (has no administration in time range)  calcium  gluconate 1 g/ 50 mL sodium chloride  IVPB (1,000 mg Intravenous New Bag/Given 11/06/23 1810)  insulin  aspart (novoLOG ) injection 5 Units (has no administration in time range)    And  dextrose  50 % solution 50 mL (50 mLs Intravenous Given 11/06/23 1806)  midodrine  (PROAMATINE ) tablet 5 mg (5 mg Oral Given 11/06/23 1711)  sodium bicarbonate  injection 50 mEq (50 mEq Intravenous Given 11/06/23 1804)     IMPRESSION / MDM / ASSESSMENT AND PLAN / ED COURSE  I reviewed the triage vital signs and the nursing notes.   Patient's presentation is most consistent with acute presentation with potential threat to life or bodily function.    Patient comes in with near syncopal given it was after having a bowel movement I wonder if it could have been vasovagal however given patient's history of V. tach EKG, cardiac markers ordered.  Given his abdominal pain CT ordered  evaluate for acute abdominal pathology although at this time he states that his abdominal pain feels much better.  Will get labs to evaluate for hyperkalemia.  Patient noted to be hyperkalemic.  His repeat EKGs are concerning that they are have widened QRSs.  Therefore I treated patient with calcium  bicarb Lokelma  insulin .  I discussed the case  with nephrology and they are going to do emergent dialysis tonight.  They did not feel patient needed a bicarb drip.  Repeat EKG after calcium  was relatively unchanged so not sure if this is from the hyperkalemia or from his prior history of V. tach and he is already on amiodarone  although he has missed a dose tonight.  I will discuss case with cardiology  Discussed with Dr. Micael Adas who was not sure if this was a A-fib with aberrancy versus V. tach versus all hyperkalemia driven.  She is going to talk to the EP doctor about his EKGs.  They thought at this time that it was more likely related to the hyperkalemia it was okay to give his home dose of vtach but they wanted to see how he did after dialysis.  Patient was reevaluated he is really asymptomatic at this time blood pressures are stable.   6:37 PM Discussed with Dr. Andee Kato from the ICU stated that patient could be admitted to stepdown under the hospitalist.  Discussed with the hospitalist for admission.  If the EKG remains abnormal with widened QRS is after dialysis then the hospitalist is aware that they should be discussed with the cardiology team.  The patient is on the cardiac monitor to evaluate for evidence of arrhythmia and/or significant heart rate changes.      FINAL CLINICAL IMPRESSION(S) / ED DIAGNOSES   Final diagnoses:  ESRD (end stage renal disease) on dialysis (HCC)  Hyperkalemia  Near syncope  Abnormal EKG     Rx / DC Orders   ED Discharge Orders     None        Note:  This document was prepared using Dragon voice recognition software and may include unintentional  dictation errors.   Lubertha Rush, MD 11/06/23 [id number]

## 2023-11-06 NOTE — H&P (Addendum)
 History and Physical    [name] DOB: 22-May-1946 DOA: 11/06/2023 DOS: the patient was seen and examined on 11/06/2023 PCP: Sheron Dixons, MD  Patient coming from: Home  Chief Complaint:  Chief Complaint  Patient presents with   Hypotension   Tachycardia   HPI: Ronald [name] is a 78 y.o. male with medical history significant of coronary artery disease status post CABG x 2, ischemic cardiomyopathy with left ventricular ejection fraction of 20 to 25%, persistent atrial fibrillation, type 2 diabetes mellitus, end-stage renal disease on hemodialysis, GERD, COPD.  He presents to the emergency room today on account of feeling lightheaded while he was in the bathroom earlier today.  Per patient, he felt lightheaded and dizzy.  She did not pass out.  He called for help from his wife.  Per wife, she checked blood pressure and reported blood pressure to be lower at 70/40.  She administered 10 mg of midodrine  to help with his symptoms.  Patient continued to feel dizzy.  He also felt weak.  Denied any chest pain or diaphoresis.  He was admitted in May with similar symptoms and was found to have a tachyarrhythmia interpreted to be wide-complex tachycardia.  He was discharged home on amiodarone  and reports compliance with medications.  Per wife, EMS was called and patient was apparently shocked into rhythm.  He was brought to the emergency room to be further evaluated.  EKG again reveals findings of wide-complex tachyarrhythmia.  Rate of 115.  Vitals however stable at this time 120/63.  Patient saturating at 100% on room air.  Review of Systems: unable to review all systems due to the inability of the patient to answer questions. Past Medical History:  Diagnosis Date   AAA (abdominal aortic aneurysm) (HCC)    a. 3cm by US  2015; b. 07/2023 Ao U/S: Abd Ao 2.6cm.   Arthritis    "hips; back" (12/13/2014)   CAD (coronary artery disease)    a. 1996 s/p CABGx4: LIMA-LAD, VG-Cx,  VG-RCA, VG-diag; b. 2007 s/p redo CABGx2: VG-OM, VG-RCA due to VG disease; c. NSTEMI 11/2014 s/p DES to SVG-OM from the Y graft; d. 09/2015 PCI: distal body of SVG-Diag; e. 07/2017 Cath: LAD 100p, LCX 100p, RCA 95ost/100p, VG->dRCA 10p ISR, 30p/m/d, Y graft->OM3 (ok) & D2 43m, patent distal stent, LIMA->LAD ok, VG->OM2 100, VG->RPAV 100-->Med rx.   Chronic combined systolic and diastolic CHF (congestive heart failure) (HCC)    a. remote EF 40-45% in 2006. b. Normal EF 2014. c. Echo 07/2016 EF 45-50%, grade 1 DD. d. Echo 2020 30% to 35%; e. 03/2023 Echo: EF 20-25%, glob HK, inf wall best preserved. Mildly reduced RV fxn, RVSP 39.42mmHg. Mod dil LA. Mod MR/TR. AoV sclerosis.   Chronic lower back pain    CKD (chronic kidney disease), stage IV (HCC)    COPD (chronic obstructive pulmonary disease) (HCC)    Deafness in left ear    Degenerative disc disease, lumbar    Diabetes mellitus, type 2 (HCC) 10/04/2014   Microalbumin 05/11/2012-100. Foot exam/monofilament 05/11/2012-normal.   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   Emphysema    Esophageal stricture 07/02/1998   EGD   Genital candidiasis in male 10/25/2012   GERD (gastroesophageal reflux disease)    History of gout    "last flareup was in 2007" (12/13/2014)   History of hiatal hernia    Hyperlipidemia    Hypertension    Ischemic cardiomyopathy 2006   Echo 2020: EF 30-35%, diffuse hypokinesis  Ischemic cardiomyopathy    Myocardial infarction (HCC) 12/13/2014   NSTEMI (non-ST elevated myocardial infarction) (HCC) 12/13/2014   PAF (paroxysmal atrial fibrillation) (HCC)    a. CHA2DS2VASc = 6-->eliquis .  On Amio.   PVC's (premature ventricular contractions)    Renal artery stenosis (HCC)    a. noted on CT 2008.   Type II diabetes mellitus (HCC)    Diet control    Unstable angina (HCC) 07/27/2017   Walking pneumonia 1990's   Wears dentures    full upper   Past Surgical History:  Procedure Laterality Date   CARDIAC CATHETERIZATION   "several"   CARDIAC CATHETERIZATION N/A 12/13/2014   Procedure: Left Heart Cath and Coronary Angiography;  Surgeon: Lucendia Rusk, MD;  Location: [facility] INVASIVE CV LAB;  Service: Cardiovascular;  Laterality: N/A;   CARDIAC CATHETERIZATION  12/13/2014   Procedure: Coronary Stent Intervention;  Surgeon: Lucendia Rusk, MD;  Location: [facility] INVASIVE CV LAB;  Service: Cardiovascular;;   CARDIAC CATHETERIZATION N/A 10/07/2015   Procedure: Left Heart Cath and Cors/Grafts Angiography;  Surgeon: Odie Benne, MD;  Location: [facility] INVASIVE CV LAB;  Service: Cardiovascular;  Laterality: N/A;   CARDIAC CATHETERIZATION N/A 10/07/2015   Procedure: Coronary Stent Intervention;  Surgeon: Odie Benne, MD;  Location: [facility] INVASIVE CV LAB;  Service: Cardiovascular;  Laterality: N/A;   CARDIOVERSION N/A 05/25/2022   Procedure: CARDIOVERSION;  Surgeon: Jerryl Morin, DO;  Location: [facility] ENDOSCOPY;  Service: Cardiovascular;  Laterality: N/A;   CATARACT EXTRACTION W/PHACO Left 10/21/2021   Procedure: CATARACT EXTRACTION PHACO AND INTRAOCULAR LENS PLACEMENT (IOC) LEFT 3.94 00:33.7;  Surgeon: Rosa College, MD;  Location: [facility] SURGERY CNTR;  Service: Ophthalmology;  Laterality: Left;   CATARACT EXTRACTION W/PHACO Right 11/09/2021   Procedure: CATARACT EXTRACTION PHACO AND INTRAOCULAR LENS PLACEMENT (IOC) RIGHT;  Surgeon: Rosa College, MD;  Location: [facility] SURGERY [facility];  Service: Ophthalmology;  Laterality: Right;  3.59 0:29.4   CORONARY ANGIOPLASTY  "several"   CORONARY ANGIOPLASTY WITH STENT PLACEMENT  2005; 12/13/2014   "2; 1"   CORONARY ARTERY BYPASS GRAFT  05/31/1994   CABG X5   CORONARY ARTERY BYPASS GRAFT  07/29/2005   CABG X3   DIALYSIS/PERMA CATHETER INSERTION N/A 09/10/2022   Procedure: DIALYSIS/PERMA CATHETER INSERTION;  Surgeon: Jackquelyn Mass, MD;  Location: [facility] INVASIVE CV LAB;  Service: Cardiovascular;  Laterality: N/A;   ESOPHAGOGASTRODUODENOSCOPY (EGD) WITH  ESOPHAGEAL DILATION  05/31/1998   GREEN LIGHT LASER TURP (TRANSURETHRAL RESECTION OF PROSTATE  01/30/1999   "not cancerous"   HERNIA REPAIR     LAPAROSCOPIC CHOLECYSTECTOMY     LEFT HEART CATH AND CORS/GRAFTS ANGIOGRAPHY N/A 07/28/2017   Procedure: LEFT HEART CATH AND CORS/GRAFTS ANGIOGRAPHY;  Surgeon: Millicent Ally, MD;  Location: [facility] INVASIVE CV LAB;  Service: Cardiovascular;  Laterality: N/A;   LUNG SURGERY  05/31/1994   "S/P CABG, had to put staple in lung after it had collapsed"   MINOR REMOVAL OF PERITONEAL DIALYSIS CATHETER  10/2022   UMBILICAL HERNIA REPAIR     w/chole   Social History:  reports that he quit smoking about 37 years ago. His smoking use included cigarettes. He started smoking about 57 years ago. He has a 60 pack-year smoking history. He has never used smokeless tobacco. He reports that he does not drink alcohol and does not use drugs.  Allergies  Allergen Reactions   Predicort [Prednisolone] Other (See Comments)    Stomach pain   Ciprofloxacin Other (See Comments)    GI upset  Hydrochlorothiazide Other (See Comments)    Dehydration   Hydrocodone Nausea Only and Other (See Comments)    Stomach upset    Hydrocodone-Acetaminophen  Nausea Only    Stomach upset   Sulfa Antibiotics Other (See Comments)    Cannot recall   Penicillins Hives, Rash and Other (See Comments)    Has patient had a PCN reaction causing immediate rash, facial/tongue/throat swelling, SOB or lightheadedness with hypotension: YES  Has patient had a PCN reaction causing severe rash involving mucus membranes or skin necrosis: NO  Has patient had a PCN reaction that required hospitalization NO  Has patient had a PCN reaction occurring within the last 10 years:NO  If all of the above answers are "NO", then may proceed with Cephalosporin use.  Has patient had a PCN reaction causing immediate rash, facial/tongue/throat swelling, SOB or lightheadedness with hypotension: YES Has patient had a  PCN reaction causing severe rash involving mucus membranes or skin necrosis: NO Has patient had a PCN reaction that required hospitalization NO Has patient had a PCN reaction occurring within the last 10 years:NO If all of the above answers are "NO", then may proceed with Cephalosporin use.    Family History  Problem Relation Age of Onset   Heart attack Mother        MI   Stroke Mother    Heart disease Mother    Hypertension Mother    Hyperlipidemia Mother    Asthma Mother    Heart disease Father    Rheumatic fever Father    Colon cancer Neg Hx     Prior to Admission medications   Medication Sig Start Date End Date Taking? Authorizing Provider  acetaminophen  (TYLENOL ) 650 MG CR tablet Take 1,300 mg by mouth at bedtime.    [provider]  allopurinol  (ZYLOPRIM ) 100 MG tablet TAKE 1 TABLET BY MOUTH EVERYDAY AT BEDTIME 07/08/23   Sheron Dixons, MD  amiodarone  (PACERONE ) 400 MG tablet Take 1 tablet (400 mg total) by mouth 2 (two) times daily for 7 days, THEN 0.5 tablets (200 mg total) 2 (two) times daily for 7 days, THEN 0.5 tablets (200 mg total) daily. 10/01/23 11/14/23  Jodeane Mulligan, DO  aspirin  EC 81 MG tablet Take 1 tablet (81 mg total) by mouth daily. Swallow whole. 10/02/23   Jodeane Mulligan, DO  atorvastatin  (LIPITOR) 40 MG tablet TAKE 1 TABLET BY MOUTH EVERYDAY AT BEDTIME 10/14/23   Sheron Dixons, MD  cetirizine (ZYRTEC) 10 MG chewable tablet Chew 10 mg by mouth daily.    [provider]  Cholecalciferol  (VITAMIN D -3) 125 MCG (5000 UT) TABS Take 2,000 Units by mouth daily.     [provider]  doxepin  (SINEQUAN ) 25 MG capsule Take 1 capsule (25 mg total) by mouth at bedtime. 09/30/23   Sheron Dixons, MD  furosemide  (LASIX ) 40 MG tablet Take 40 mg by mouth daily as needed for edema. Taking 1 1/2 tablets (60mg  total) Pt takes on Tue. Thur. Sat.and Sunday's. 10/12/22   [provider]  levothyroxine  (SYNTHROID ) 75 MCG tablet TAKE 1 TABLET BY  MOUTH EVERY DAY BEFORE BREAKFAST 10/17/23   Sheron Dixons, MD  metaxalone  (SKELAXIN ) 400 MG tablet Take 1 tablet (400 mg total) by mouth in the morning and at bedtime. 10/07/23   Sheron Dixons, MD  midodrine  (PROAMATINE ) 5 MG tablet Take 1 tablet (5 mg total) by mouth 3 (three) times daily with meals. 08/09/23   Wouk, Haynes Lips, MD  Multiple  Vitamin (MULTIVITAMIN PO) Take 1 tablet by mouth daily.    [provider]  nitroGLYCERIN  (NITROSTAT ) 0.4 MG SL tablet Place 0.4 mg under the tongue every 5 (five) minutes as needed for chest pain (Up to 3 times).    [provider]  pantoprazole  (PROTONIX ) 40 MG tablet Take 1 tablet (40 mg total) by mouth 2 (two) times daily. Home med. 07/23/23   Sheron Dixons, MD  senna (SENOKOT) 8.6 MG tablet Take 1 tablet by mouth as needed for constipation.    [provider]    Physical Exam: Vitals:   11/06/23 1703 11/06/23 1708 11/06/23 1708  BP: 103/60    Pulse: (!) 110    Resp: 20    Temp:  97.6 F (36.4 C)   TempSrc:  Oral   SpO2: 100%    Weight:   97.4 kg  Height:   5\' 9"  (1.753 m)   General: Patient is a chronically ill appearing gentleman.  He is obese.  He is alert oriented and communicating effectively. HEENT: Head is atraumatic neck supple oral mucosa moist Neck: Supple with no obvious JVD Chest: Midline incision suggesting prior CABG.  Diminished breath sounds at the base of the lungs. Cardiovascular: Tachycardia.  Rhythm with escape beats.  No audible murmurs. Abdomen: Rotund soft non tender Extremities significant for bilateral lower extremity edema  Data Reviewed: WBCs 5.4, hemoglobin 9.4, hematocrit 29.2, platelet count 89, neutrophil count 78, sodium 136, potassium 6.3, chloride 100, bicarb 23, glucose 135, BUN 48, creatinine 6.46, calcium  9.2, magnesium  2.8  Chest x-ray shows cardiomegaly with vascular congestion  Echocardiogram shows ejection fraction of 20 to 25%.  Patient has evidence of global  hypokinesis.  Mild elevated pulmonary artery systolic pressure were noted.  An improvement  EKG reviewed  Assessment and Plan:  78 year old with coronary disease status post CABG, chronic ischemic cardiomyopathy with left ventricular ejection fraction of 25%, resistant complex tachyarrhythmia and persistent A-fib on amiodarone .  He presents with near syncopal events associated with hypotension attributed to be secondary to arrhythmia.  Patient apparently was shocked in the field.  Clinically stable at this time.  She is scheduled for hemodialysis due to severe hyperkalemia  #Near syncope: Most likely due to tachyarrhythmia -Noted wide-complex tachycardia on presentation.   -Patient has been on amiodarone  200 mg daily since his last admission in May 2025.   - Unclear if patient could benefit from AICD versus cardiac vest.  Will defer to cardiology.  Cardiology consulted  #Persistent atrial fibrillation -Complex tachyarrhythmia on admission. -Will correct underlying hyperkalemia -Patient is currently on amiodarone  for rate and rhythm control. -Not on anticoagulation on account of reported bleeding from dialysis site.  #Hyperkalemia -Patient was treated with hyperkalemia protocol in the ER. -He is scheduled for hemodialysis this night. -Repeat serum potassium after hemodialysis.   #Acute on chronic HFrEF -Known ischemic cardiomyopathy, EF 20-25%.   -Unable to tolerate GDMT secondary to hypotension.   -Currently on midodrine  3 times daily.   -Volume removal anticipated with HD.   #End-stage renal MSM Mamma 35 do not on Thursday right or Thursday or Friday what ever exam is a over the weekend on HD - Usual schedule MWF.   -Dialysis performed here 5/2.   #CAD with history of CABG x 2. -He is asymptomatic at this time. -Troponin is 32.  Delta remains flat. -Continue with statins and Aspirin  81 mg daily.  # Consider palliative care consult for complex medical decision making.  Advance Care Planning:   Code Status: Full Code   Consults: Cardiology consults and nephrology consult  Family Communication: Wife at bedside were updated.  Severity of Illness: The appropriate patient status for this patient is INPATIENT. Inpatient status is judged to be reasonable and necessary in order to provide the required intensity of service to ensure the patient's safety. The patient's presenting symptoms, physical exam findings, and initial radiographic and laboratory data in the context of their chronic comorbidities is felt to place them at high risk for further clinical deterioration. Furthermore, it is not anticipated that the patient will be medically stable for discharge from the hospital within 2 midnights of admission.   * I certify that at the point of admission it is my clinical judgment that the patient will require inpatient hospital care spanning beyond 2 midnights from the point of admission due to high intensity of service, high risk for further deterioration and high frequency of surveillance required.*  Author: Theodora Fish, MD 11/06/2023 7:25 PM  For on call review [email or web address].

## 2023-11-06 NOTE — ED Triage Notes (Signed)
 Coming from home. Hx A-fib and a dialysis pt M,W,F. Patient states that he has not missed any. But family states he does not go as he should EMS called for BP issues but his HR was fast and BP was low. EMS BP: 70/60 last 101/70. No recent changed. EMS gave 5mg  of Cardizem . Patient had episode of diarrhea. Pt has been put on metoprolol  last year. Patient went to the bathroom and felt like he was going to pass out but did not hit his head nor fall. Family stated they helped him back to the chair. He also has abdominal pain. Patient AO2~3

## 2023-11-06 NOTE — ED Notes (Signed)
 Admitting MD at bedside.

## 2023-11-07 DIAGNOSIS — I959 Hypotension, unspecified: Secondary | ICD-10-CM

## 2023-11-07 DIAGNOSIS — E875 Hyperkalemia: Secondary | ICD-10-CM

## 2023-11-07 DIAGNOSIS — R000 Tachycardia, unspecified: Secondary | ICD-10-CM | POA: Diagnosis not present

## 2023-11-07 DIAGNOSIS — N186 End stage renal disease: Secondary | ICD-10-CM | POA: Diagnosis not present

## 2023-11-07 DIAGNOSIS — I472 Ventricular tachycardia, unspecified: Principal | ICD-10-CM

## 2023-11-07 DIAGNOSIS — Z992 Dependence on renal dialysis: Secondary | ICD-10-CM

## 2023-11-07 LAB — BASIC METABOLIC PANEL WITH GFR
Anion gap: 10 (ref 5–15)
BUN: 27 mg/dL — ABNORMAL HIGH (ref 8–23)
CO2: 29 mmol/L (ref 22–32)
Calcium: 8.8 mg/dL — ABNORMAL LOW (ref 8.9–10.3)
Chloride: 97 mmol/L — ABNORMAL LOW (ref 98–111)
Creatinine, Ser: 4.37 mg/dL — ABNORMAL HIGH (ref 0.61–1.24)
GFR, Estimated: 13 mL/min — ABNORMAL LOW (ref >60)
Glucose, Bld: 78 mg/dL (ref 70–99)
Potassium: 4.9 mmol/L (ref 3.5–5.1)
Sodium: 136 mmol/L (ref 135–145)

## 2023-11-07 LAB — CBC
HCT: 26.7 % — ABNORMAL LOW (ref 39.0–52.0)
Hemoglobin: 8.8 g/dL — ABNORMAL LOW (ref 13.0–17.0)
MCH: 30.8 pg (ref 26.0–34.0)
MCHC: 33 g/dL (ref 30.0–36.0)
MCV: 93.4 fL (ref 80.0–100.0)
Platelets: 81 10*3/uL — ABNORMAL LOW (ref 150–400)
RBC: 2.86 MIL/uL — ABNORMAL LOW (ref 4.22–5.81)
RDW: 16.4 % — ABNORMAL HIGH (ref 11.5–15.5)
WBC: 4.6 10*3/uL (ref 4.0–10.5)
nRBC: 0 % (ref 0.0–0.2)

## 2023-11-07 LAB — HEPATITIS B SURFACE ANTIGEN: Hepatitis B Surface Ag: NONREACTIVE

## 2023-11-07 LAB — MRSA NEXT GEN BY PCR, NASAL: MRSA by PCR Next Gen: NOT DETECTED

## 2023-11-07 LAB — POTASSIUM: Potassium: 4 mmol/L (ref 3.5–5.1)

## 2023-11-07 LAB — TSH: TSH: 4.523 u[IU]/mL — ABNORMAL HIGH (ref 0.350–4.500)

## 2023-11-07 MED ORDER — BISACODYL 5 MG PO TBEC: 5.0000 mg | DELAYED_RELEASE_TABLET | Freq: Every day | ORAL | Status: DC | PRN

## 2023-11-07 MED ORDER — IPRATROPIUM BROMIDE 0.02 % IN SOLN
0.5000 mg | Freq: Four times a day (QID) | RESPIRATORY_TRACT | Status: DC
  Administered 2023-11-07 (×2): 0.5 mg via RESPIRATORY_TRACT
  Filled 2023-11-07 (×3): qty 2.5

## 2023-11-07 MED ORDER — MIDODRINE HCL 5 MG PO TABS
5.0000 mg | ORAL_TABLET | Freq: Three times a day (TID) | ORAL | Status: DC
  Administered 2023-11-07 – 2023-11-08 (×5): 5 mg via ORAL
  Filled 2023-11-07 (×5): qty 1

## 2023-11-07 MED ORDER — LEVOTHYROXINE SODIUM 50 MCG PO TABS
75.0000 ug | ORAL_TABLET | Freq: Every day | ORAL | Status: DC
  Administered 2023-11-07 – 2023-11-08 (×2): 75 ug via ORAL
  Filled 2023-11-07: qty 2
  Filled 2023-11-07: qty 1

## 2023-11-07 MED ORDER — METAXALONE 800 MG PO TABS
400.0000 mg | ORAL_TABLET | Freq: Two times a day (BID) | ORAL | Status: DC
  Administered 2023-11-07 – 2023-11-08 (×3): 400 mg via ORAL
  Filled 2023-11-07 (×4): qty 0.5

## 2023-11-07 MED ORDER — ACETAMINOPHEN 650 MG RE SUPP: 650.0000 mg | Freq: Four times a day (QID) | RECTAL | Status: DC | PRN

## 2023-11-07 MED ORDER — SENNA 8.6 MG PO TABS: 1.0000 | ORAL_TABLET | Freq: Every day | ORAL | Status: DC | PRN

## 2023-11-07 MED ORDER — ONDANSETRON HCL 4 MG PO TABS: 4.0000 mg | ORAL_TABLET | Freq: Four times a day (QID) | ORAL | Status: DC | PRN

## 2023-11-07 MED ORDER — ATORVASTATIN CALCIUM 20 MG PO TABS
40.0000 mg | ORAL_TABLET | Freq: Every day | ORAL | Status: DC
  Administered 2023-11-07 – 2023-11-08 (×2): 40 mg via ORAL
  Filled 2023-11-07 (×2): qty 2

## 2023-11-07 MED ORDER — HEPARIN SODIUM (PORCINE) 5000 UNIT/ML IJ SOLN
5000.0000 [IU] | Freq: Three times a day (TID) | INTRAMUSCULAR | Status: DC
  Administered 2023-11-07 – 2023-11-08 (×4): 5000 [IU] via SUBCUTANEOUS
  Filled 2023-11-07 (×4): qty 1

## 2023-11-07 MED ORDER — FUROSEMIDE 40 MG PO TABS: 40.0000 mg | ORAL_TABLET | Freq: Every day | ORAL | Status: DC | PRN

## 2023-11-07 MED ORDER — AMIODARONE HCL 200 MG PO TABS: 200.0000 mg | ORAL_TABLET | Freq: Every day | ORAL | Status: DC

## 2023-11-07 MED ORDER — SODIUM CHLORIDE 0.9 % IV SOLN: 250.0000 mL | INTRAVENOUS | Status: AC | PRN

## 2023-11-07 MED ORDER — LORATADINE 10 MG PO TABS
10.0000 mg | ORAL_TABLET | Freq: Every day | ORAL | Status: DC
  Administered 2023-11-07 – 2023-11-08 (×2): 10 mg via ORAL
  Filled 2023-11-07 (×2): qty 1

## 2023-11-07 MED ORDER — ASPIRIN 81 MG PO TBEC
81.0000 mg | DELAYED_RELEASE_TABLET | Freq: Every day | ORAL | Status: DC
  Administered 2023-11-07 – 2023-11-08 (×2): 81 mg via ORAL
  Filled 2023-11-07 (×2): qty 1

## 2023-11-07 MED ORDER — AMIODARONE HCL 200 MG PO TABS
400.0000 mg | ORAL_TABLET | Freq: Every day | ORAL | Status: DC
  Administered 2023-11-08: 400 mg via ORAL
  Filled 2023-11-07: qty 2

## 2023-11-07 MED ORDER — SODIUM CHLORIDE 0.9% FLUSH
3.0000 mL | Freq: Two times a day (BID) | INTRAVENOUS | Status: DC
  Administered 2023-11-07 (×3): 3 mL via INTRAVENOUS

## 2023-11-07 MED ORDER — ACETAMINOPHEN 325 MG PO TABS
650.0000 mg | ORAL_TABLET | Freq: Four times a day (QID) | ORAL | Status: DC | PRN
Start: 2023-11-07 — End: 2023-11-08

## 2023-11-07 MED ORDER — NITROGLYCERIN 0.4 MG SL SUBL
0.4000 mg | SUBLINGUAL_TABLET | SUBLINGUAL | Status: DC | PRN
  Administered 2023-11-08: 0.4 mg via SUBLINGUAL
  Filled 2023-11-07: qty 1

## 2023-11-07 MED ORDER — DOXEPIN HCL 25 MG PO CAPS
25.0000 mg | ORAL_CAPSULE | Freq: Every day | ORAL | Status: DC
  Administered 2023-11-07: 25 mg via ORAL
  Filled 2023-11-07 (×2): qty 1

## 2023-11-07 MED ORDER — ALLOPURINOL 100 MG PO TABS
100.0000 mg | ORAL_TABLET | Freq: Every day | ORAL | Status: DC
  Administered 2023-11-07: 100 mg via ORAL
  Filled 2023-11-07: qty 1

## 2023-11-07 MED ORDER — ALBUTEROL SULFATE (2.5 MG/3ML) 0.083% IN NEBU: 2.5000 mg | INHALATION_SOLUTION | Freq: Four times a day (QID) | RESPIRATORY_TRACT | Status: DC | PRN

## 2023-11-07 MED ORDER — ONDANSETRON HCL 4 MG/2ML IJ SOLN: 4.0000 mg | Freq: Four times a day (QID) | INTRAMUSCULAR | Status: DC | PRN

## 2023-11-07 MED ORDER — ACETAMINOPHEN ER 650 MG PO TBCR: 1300.0000 mg | EXTENDED_RELEASE_TABLET | Freq: Every day | ORAL | Status: DC

## 2023-11-07 MED ORDER — MEXILETINE HCL 200 MG PO CAPS
200.0000 mg | ORAL_CAPSULE | Freq: Two times a day (BID) | ORAL | Status: DC
  Administered 2023-11-07 – 2023-11-08 (×2): 200 mg via ORAL
  Filled 2023-11-07 (×2): qty 1

## 2023-11-07 MED ORDER — SODIUM CHLORIDE 0.9% FLUSH: 3.0000 mL | INTRAVENOUS | Status: DC | PRN

## 2023-11-07 MED ORDER — PANTOPRAZOLE SODIUM 40 MG PO TBEC
40.0000 mg | DELAYED_RELEASE_TABLET | Freq: Two times a day (BID) | ORAL | Status: DC
  Administered 2023-11-07 – 2023-11-08 (×4): 40 mg via ORAL
  Filled 2023-11-07 (×4): qty 1

## 2023-11-07 NOTE — Evaluation (Signed)
 Occupational Therapy Evaluation Patient Details Name: Ronald [name] [facility] MRN: [id number] DOB: January 09, 1946 Today's Date: 11/07/2023   History of Present Illness   Patient is a 78 year old male with near syncope, tachyarrhythmia, hypotension. History of CABG, ischemic cardiomyopathy, atrial fibrillation, DM2, ESRD on hemodialysis, GERD, COPD     Clinical Impressions Pt was seen for PT/OT co-evaluation this date to maximize pt/therapist safety d/t limited endurance at baseline. PTA, pt lives at home with his wife where he was mostly IND with ADLs, but has assist from his wife for IADLs and some LB ADLs as needed. He sponge bathes most of the time and has assist from wife with showers d/t dialysis port. He now ambulates with a RW consistently household distances. Pt presents to acute OT demonstrating no acute OT needs as he feels his is near his baseline. BP remained stable during session with no reports of dizziness. He needed Min A to don slippers over his socks seated at EOB. CGA for STS and mobility into the hallway and back using RW with UB reliance. Good strength in UB and wife and pt deny acute OT needs. Wife provides good support at home. No further OT needs, will sign off in house with no OT follow up needed on DC.     If plan is discharge home, recommend the following:   A little help with walking and/or transfers;A little help with bathing/dressing/bathroom     Functional Status Assessment   Patient has not had a recent decline in their functional status     Equipment Recommendations   None recommended by OT     Recommendations for Other Services         Precautions/Restrictions   Precautions Precautions: Fall Recall of Precautions/Restrictions: Intact Precaution/Restrictions Comments: monitor BP and HR Restrictions Weight Bearing Restrictions Per Provider Order: No     Mobility Bed Mobility               General bed mobility comments: seated EOB on  arrival    Transfers Overall transfer level: Needs assistance   Transfers: Sit to/from Stand Sit to Stand: Contact guard assist           General transfer comment: CGA for STS from EOB to RW with cues for safety, ambulated out into the hallway and back with x1 CGA and RW with pt and wife reporting he is near his baseline      Balance Overall balance assessment: Needs assistance Sitting-balance support: Feet supported Sitting balance-Leahy Scale: Fair     Standing balance support: Bilateral upper extremity supported Standing balance-Leahy Scale: Poor Standing balance comment: heavy support on RW with ambulation                           ADL either performed or assessed with clinical judgement   ADL Overall ADL's : Needs assistance/impaired                     Lower Body Dressing: Minimal assistance;Sitting/lateral leans Lower Body Dressing Details (indicate cue type and reason): to don slippers over socks-per wife bed height limited pt's ability                     Vision         Perception         Praxis         Pertinent Vitals/Pain Pain Assessment Pain Assessment: No/denies pain  Extremity/Trunk Assessment Upper Extremity Assessment Upper Extremity Assessment: Overall WFL for tasks assessed   Lower Extremity Assessment Lower Extremity Assessment: Generalized weakness   Cervical / Trunk Assessment Cervical / Trunk Assessment: Kyphotic   Communication Communication Communication: No apparent difficulties   Cognition Arousal: Alert Behavior During Therapy: WFL for tasks assessed/performed                                 Following commands: Intact       Cueing  General Comments   Cueing Techniques: Verbal cues  BP stable with no dizziness reported   Exercises Other Exercises Other Exercises: Edu on role of OT. Pt and wife feel he has no acute OT needs and they have necessary DME/AE/AD at home.    Shoulder Instructions      Home Living Family/patient expects to be discharged to:: Private residence Living Arrangements: Spouse/significant other;Children Available Help at Discharge: Family;Available 24 hours/day Type of Home: House Home Access: Stairs to enter [employer] of Steps: 5 Entrance Stairs-Rails: Right;Left;Can reach both Home Layout: One level     Bathroom Shower/Tub: Producer, television/film/video: Handicapped height     Home Equipment: BSC/3in1;Cane - quad;Shower seat;Rolling Environmental consultant (2 wheels)          Prior Functioning/Environment Prior Level of Function : Independent/Modified Independent             Mobility Comments: Mod I with RW, limited distances. sleeps in a chair ADLs Comments: Wife assists as needed for iADLs, lower body dressing, pt sponge bathes and wife assists with showering seated on shower chair on occasion d/t dialysis port    OT Problem List:     OT Treatment/Interventions:        OT Goals(Current goals can be found in the care plan section)       OT Frequency:       Co-evaluation PT/OT/SLP Co-Evaluation/Treatment: Yes Reason for Co-Treatment: Complexity of the patient's impairments (multi-system involvement) PT goals addressed during session: Mobility/safety with mobility OT goals addressed during session: ADL's and self-care      AM-PAC OT "6 Clicks" Daily Activity     Outcome Measure Help from another person eating meals?: None Help from another person taking care of personal grooming?: None Help from another person toileting, which includes using toliet, bedpan, or urinal?: A Little Help from another person bathing (including washing, rinsing, drying)?: A Little Help from another person to put on and taking off regular upper body clothing?: None Help from another person to put on and taking off regular lower body clothing?: A Little 6 Click Score: 21   End of Session Equipment Utilized During  Treatment: Rolling walker (2 wheels) Nurse Communication: Mobility status  Activity Tolerance: Patient tolerated treatment well Patient left: in bed;with call bell/phone within reach;with family/visitor present  OT Visit Diagnosis: Other abnormalities of gait and mobility (R26.89)                Time: 1610-9604 OT Time Calculation (min): 14 min Charges:  OT General Charges $OT Visit: 1 Visit OT Evaluation $OT Eval Low Complexity: 1 Low Ronald [name], OTR/L  11/07/23, 1:57 PM  Delesa Kawa E Kyriaki Moder 11/07/2023, 1:53 PM

## 2023-11-07 NOTE — TOC Initial Note (Signed)
 Transition of Care [facility]) - Initial/Assessment Note    Patient Details  Name: Ronald [name] MRN: [id number] Date of Birth: 1945/10/12  [facility]) CM/SW Contact:    Odilia Bennett, LCSW Phone Number: 11/07/2023, 10:13 AM  Clinical Narrative:  Readmission prevention screen complete. CSW met with patient. No family at bedside. CSW introduced role and explained that discharge planning would be discussed. PCP is Janna Melter, MD. Wife drives him to appointments. [facility] [place]. No issues obtaining medications. Patient lives home with his wife and son. No home health prior to admission. He has a RW and shower chair at home. No further concerns. CSW will continue to follow patient for support and facilitate return home once stable. His wife will transport him home at discharge.                Expected Discharge Plan: Home/Self Care Barriers to Discharge: Continued Medical Work up   Patient Goals and CMS Choice            Expected Discharge Plan and Services     Post Acute Care Choice: NA Living arrangements for the past 2 months: Single Family Home                                      Prior Living Arrangements/Services Living arrangements for the past 2 months: Single Family Home Lives with:: Adult Children, Spouse Patient language and need for interpreter reviewed:: Yes Do you feel safe going back to the place where you live?: Yes      Need for Family Participation in Patient Care: Yes (Comment) Care giver support system in place?: Yes (comment) Current home services: DME Criminal Activity/Legal Involvement Pertinent to Current Situation/Hospitalization: No - Comment as needed  Activities of Daily Living      Permission Sought/Granted                  Emotional Assessment Appearance:: Appears stated age Attitude/Demeanor/Rapport: Engaged, Gracious Affect (typically observed): Accepting, Appropriate, Calm, Pleasant Orientation: :  Oriented to Self, Oriented to Place, Oriented to  Time, Oriented to Situation Alcohol / Substance Use: Not Applicable Psych Involvement: No (comment)  Admission diagnosis:  Hyperkalemia [E87.5] Abnormal EKG [R94.31] Tachyarrhythmia [R00.0] ESRD (end stage renal disease) on dialysis (HCC) [N18.6, Z99.2] Near syncope [R55] Patient Active Problem List   Diagnosis Date Noted   Tachyarrhythmia 11/06/2023   Dyslipidemia 09/30/2023   ESRD (end stage renal disease) (HCC) 09/30/2023   Non-sustained ventricular tachycardia (HCC) 09/29/2023   Hypothyroidism 09/29/2023   Gout 09/29/2023   Tremor of both hands 09/05/2023   Pruritus due to systemic disorder 09/05/2023   Hypotension 08/07/2023   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   Near syncope 08/05/2023   Acute blood loss anemia (ABLA) 08/05/2023   HFrEF (heart failure with reduced ejection fraction) (HCC) 07/19/2023   Muscle tension headache 11/15/2022   Acquired thrombophilia (HCC) 11/10/2022   ESRD (end stage renal disease) on dialysis (HCC) 09/16/2022   Physical deconditioning 09/16/2022   Bilateral leg edema 09/13/2022   Coronary artery disease of native artery of native heart with stable angina pectoris (HCC) 07/22/2022   OSA (obstructive sleep apnea) 07/22/2022   Chronic systolic CHF (congestive heart failure) (HCC) 07/15/2022   Typical atrial flutter (HCC) 07/15/2022   Acquired hypothyroidism 05/28/2022   GERD without esophagitis 05/28/2022   Essential hypertension 05/05/2022   Prediabetes 04/06/2022  Spinal stenosis of lumbar region without neurogenic claudication 04/06/2022   Weakness of both lower extremities 04/06/2022   Upper airway cough syndrome 10/01/2021   Bursitis of hip 12/18/2018   Paroxysmal atrial fibrillation (HCC) 06/10/2018   Atherosclerosis of native arteries of extremity with intermittent claudication (HCC) 06/25/2016   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   Coronary artery disease  involving native coronary artery with angina pectoris (HCC)    Allergic rhinitis 08/19/2015   Anemia 10/04/2014   AA (aortic aneurysm) (HCC) 10/04/2014   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   Basal cell papilloma 10/04/2014   Unspecified osteoarthritis, unspecified site 10/04/2014   Thoracic aortic aneurysm (TAA) (HCC) 10/04/2014   Atherosclerotic heart disease of native coronary artery without angina pectoris 10/04/2014   Benign prostatic hyperplasia with lower urinary tract symptoms 10/24/2012   DOE (dyspnea on exertion) 11/12/2009   Renal artery stenosis     Mixed hyperlipidemia 09/04/2008   History of redo bypass grafting 09/04/2008   PCP:  Sheron Dixons, MD Pharmacy:   [facility] [address], [place] - [address] [place] [address] Phone: [phone] Fax: [phone]  [facility] Delivery - [place], [place] - [address] [place] [address] Phone: [phone] Fax: [phone]     Social Drivers of Health (SDOH) Social History: SDOH Screenings   Food Insecurity: No Food Insecurity (09/30/2023)  Housing: Low Risk  (09/30/2023)  Transportation Needs: No Transportation Needs (09/30/2023)  Utilities: Not At Risk (09/30/2023)  Alcohol Screen: Low Risk  (06/22/2023)  Depression (PHQ2-9): Medium Risk (10/07/2023)  Financial Resource Strain: Low Risk  (06/23/2023)  Physical Activity: Inactive (06/23/2023)  Social Connections: Moderately Isolated (09/30/2023)  Stress: No Stress Concern Present (06/23/2023)  Tobacco Use: Medium Risk (11/06/2023)  Health Literacy: Adequate Health Literacy (06/23/2023)   SDOH Interventions:     Readmission Risk Interventions    11/07/2023   10:12 AM  Readmission Risk Prevention Plan  Transportation Screening Complete  Medication Review (RN Care Manager) Complete  PCP or Specialist appointment within 3-5 days of discharge Complete  SW Recovery Care/Counseling Consult Complete  Palliative  Care Screening Not Applicable  Skilled Nursing Facility Not Applicable

## 2023-11-07 NOTE — Evaluation (Signed)
 Physical Therapy Evaluation Patient Details Name: Ronald [name] [facility] MRN: [id number] DOB: January 16, 1946 Today's Date: 11/07/2023  History of Present Illness  Patient is a 78 year old male with near syncope, tachyarrhythmia, hypotension. History of CABG, ischemic cardiomyopathy, atrial fibrillation, DM2, ESRD on hemodialysis, GERD, COPD  Clinical Impression  Patient is agreeable to PT session. He lives with spouse and uses a rolling walker for all ambulation at home.  Today the patient reports no dizziness with mobility. Short distance ambulation performed with rolling walker with CGA for safety. Anticipate the patient could be close to his baseline level of functional independence. He is scheduled to start outpatient PT soon per family report. Recommend PT follow up while in the hospital to maximize independence and decrease caregiver burden.     If plan is discharge home, recommend the following: Help with stairs or ramp for entrance   Can travel by private vehicle        Equipment Recommendations None recommended by PT  Recommendations for Other Services       Functional Status Assessment Patient has had a recent decline in their functional status and demonstrates the ability to make significant improvements in function in a reasonable and predictable amount of time.     Precautions / Restrictions Precautions Precautions: Fall Recall of Precautions/Restrictions: Intact Precaution/Restrictions Comments: monitor BP and HR Restrictions Weight Bearing Restrictions Per Provider Order: No      Mobility  Bed Mobility               General bed mobility comments: not assessed as patient sitting up on arrival and post session    Transfers Overall transfer level: Needs assistance Equipment used: Rolling walker (2 wheels) Transfers: Sit to/from Stand Sit to Stand: Contact guard assist                Ambulation/Gait Ambulation/Gait assistance: Contact guard assist Gait  Distance (Feet): 60 Feet Assistive device: Rolling walker (2 wheels) Gait Pattern/deviations: Step-through pattern Gait velocity: decrease     General Gait Details: encouraged patient to use rolling walker for safety with ambulation. no dizziness reported with activity  Stairs            Wheelchair Mobility     Tilt Bed    Modified Rankin (Stroke Patients Only)       Balance Overall balance assessment: Needs assistance Sitting-balance support: Feet supported Sitting balance-Leahy Scale: Fair     Standing balance support: Bilateral upper extremity supported Standing balance-Leahy Scale: Poor Standing balance comment: heavy reliance on rolling walker with ambulation                             Pertinent Vitals/Pain Pain Assessment Pain Assessment: No/denies pain    Home Living Family/patient expects to be discharged to:: Private residence Living Arrangements: Spouse/significant other;Children Available Help at Discharge: Family;Available 24 hours/day Type of Home: House Home Access: Stairs to enter Entrance Stairs-Rails: Right;Left;Can reach both Entrance Stairs-Number of Steps: 5   Home Layout: One level Home Equipment: BSC/3in1;Cane - quad;Shower seat;Rolling Environmental consultant (2 wheels)      Prior Function Prior Level of Function : Independent/Modified Independent             Mobility Comments: Mod I with RW, limited distances. sleeps in a chair       Extremity/Trunk Assessment   Upper Extremity Assessment Upper Extremity Assessment: Overall WFL for tasks assessed    Lower Extremity Assessment Lower Extremity Assessment:  Generalized weakness    Cervical / Trunk Assessment Cervical / Trunk Assessment: Kyphotic  Communication   Communication Communication: No apparent difficulties    Cognition Arousal: Alert Behavior During Therapy: WFL for tasks assessed/performed   PT - Cognitive impairments: No apparent impairments                          Following commands: Intact       Cueing Cueing Techniques: Verbal cues     General Comments General comments (skin integrity, edema, etc.): BP monitored throughout session with no dizziness reported    Exercises     Assessment/Plan    PT Assessment Patient needs continued PT services  PT Problem List Decreased strength;Decreased range of motion;Decreased activity tolerance;Decreased balance;Decreased mobility       PT Treatment Interventions DME instruction;Gait training;Stair training;Functional mobility training;Therapeutic activities;Therapeutic exercise;Balance training;Neuromuscular re-education;Cognitive remediation;Patient/family education    PT Goals (Current goals can be found in the Care Plan section)  Acute Rehab PT Goals Patient Stated Goal: to return home PT Goal Formulation: With patient Time For Goal Achievement: 11/21/23 Potential to Achieve Goals: Good    Frequency Min 1X/week     Co-evaluation PT/OT/SLP Co-Evaluation/Treatment: Yes Reason for Co-Treatment: Complexity of the patient's impairments (multi-system involvement) PT goals addressed during session: Mobility/safety with mobility         AM-PAC PT "6 Clicks" Mobility  Outcome Measure Help needed turning from your back to your side while in a flat bed without using bedrails?: A Lot Help needed moving from lying on your back to sitting on the side of a flat bed without using bedrails?: A Little Help needed moving to and from a bed to a chair (including a wheelchair)?: A Little Help needed standing up from a chair using your arms (e.g., wheelchair or bedside chair)?: A Little Help needed to walk in hospital room?: A Little Help needed climbing 3-5 steps with a railing? : A Little 6 Click Score: 17    End of Session   Activity Tolerance: Patient tolerated treatment well Patient left: in bed;with call bell/phone within reach;with family/visitor present (seated on edge of  bed per patient preference.) Nurse Communication: Mobility status PT Visit Diagnosis: Muscle weakness (generalized) (M62.81);Unsteadiness on feet (R26.81)    Time: 5409-8119 PT Time Calculation (min) (ACUTE ONLY): 14 min   Charges:   PT Evaluation $PT Eval Moderate Complexity: 1 Mod   PT General Charges $$ ACUTE PT VISIT: 1 Visit         Ozie Bo, PT, MPT   Erlene Hawks 11/07/2023, 1:22 PM

## 2023-11-07 NOTE — Progress Notes (Signed)
 PROGRESS NOTE    Livio Ledwith [facility]  ZOX:[id number] DOB: Oct 13, 1945 DOA: 11/06/2023 PCP: Sheron Dixons, MD    Brief Narrative:   78 y.o. male with medical history significant of coronary artery disease status post CABG x 2, ischemic cardiomyopathy with left ventricular ejection fraction of 20 to 25%, persistent atrial fibrillation, type 2 diabetes mellitus, end-stage renal disease on hemodialysis, GERD, COPD admitted for feeling lightheaded and was hypotensive. Per wife, EMS was called and patient was apparently shocked into rhythm. He was brought to the emergency room to be further evaluated. EKG again reveals findings of wide-complex tachyarrhythmia. Rate of 115   6/9: cardio, nephro & Palliative care c/s   Assessment & Plan:   Principal Problem:   Tachyarrhythmia   #Near syncope: Most likely due to tachyarrhythmia #Persistent atrial fibrillation -Noted wide-complex tachycardia/v.tach on presentation.   -Patient has been on amiodarone  200 mg daily since his last admission in May 2025.   - Cardio c/s - Not on anticoagulation on account of reported bleeding from dialysis site.   #Hyperkalemia # ESRD -Patient was treated with hyperkalemia protocol in the ER. -s/p Urgent HD last night. Further HD per Nephro - Usual schedule MWF.    #Acute on chronic HFrEF -Known ischemic cardiomyopathy, EF 20-25%.   -Unable to tolerate GDMT secondary to hypotension.   -Currently on midodrine  3 times daily.   -Volume removal anticipated with HD.    #CAD with history of CABG x 2. -He is asymptomatic at this time. -Troponin is 32.  Delta remains flat. -Continue with statins and Aspirin  81 mg daily.   [facility] palliative care consult    DVT prophylaxis:  heparin  injection 5,000 Units Start: 11/07/23 0600 SCDs Start: 11/07/23 0240     Code Status: Full code Family Communication: no family at bedside Disposition Plan: possible D/C in 2-3 days depending on clinical condition and cardio  w/up   Consultants:  Cardio Nephro   Subjective:  Feeling much better this am  Objective: Vitals:   11/07/23 0630 11/07/23 0747 11/07/23 0802 11/07/23 1625  BP: (!) 103/54 117/66  (!) 99/58  Pulse: 88 94  90  Resp: 12 16    Temp:  98.1 F (36.7 C)  98 F (36.7 C)  TempSrc:  Oral  Oral  SpO2: 95% 95% 95% 100%  Weight:      Height:        Intake/Output Summary (Last 24 hours) at 11/07/2023 1814 Last data filed at 11/07/2023 1300 Gross per 24 hour  Intake 483 ml  Output 1200 ml  Net -717 ml   Filed Weights   11/06/23 1708 11/06/23 1951 11/06/23 2240  Weight: 97.4 kg 98.3 kg 97.6 kg    Examination:  General exam: Appears calm and comfortable  Respiratory system: Clear to auscultation. Respiratory effort normal. Cardiovascular system: S1 & S2 heard, RRR. Midline incision suggesting prior CABG  Gastrointestinal system: Abdomen is nondistended, soft and nontender. No organomegaly or masses felt. Normal bowel sounds heard. Central nervous system: Alert and oriented. No focal neurological deficits. Extremities: Symmetric 5 x 5 power. Skin: No rashes, lesions or ulcers Psychiatry: Judgement and insight appear normal. Mood & affect appropriate.     Data Reviewed: I have personally reviewed following labs and imaging studies  CBC: Recent Labs  Lab 11/06/23 1706 11/07/23 0603  WBC 5.4 4.6  NEUTROABS 4.3  --   HGB 9.4* 8.8*  HCT 29.2* 26.7*  MCV 94.2 93.4  PLT 89* 81*   Basic Metabolic Panel:  Recent Labs  Lab 11/06/23 1706 11/06/23 1841 11/07/23 0024 11/07/23 0603  NA 136  --   --  136  K 6.3*  --  4.0 4.9  CL 100  --   --  97*  CO2 23  --   --  29  GLUCOSE 135* 195*  --  78  BUN 48*  --   --  27*  CREATININE 6.46*  --   --  4.37*  CALCIUM  9.2  --   --  8.8*  MG 2.8*  --   --   --    GFR: Estimated Creatinine Clearance: 15.8 mL/min (A) (by C-G formula based on SCr of 4.37 mg/dL (H)). Liver Function Tests: Recent Labs  Lab 11/06/23 1706  AST 44*   ALT 28  ALKPHOS 167*  BILITOT 1.5*  PROT 6.9  ALBUMIN 3.4*   Recent Labs  Lab 11/06/23 1706  LIPASE 32    CBG: Recent Labs  Lab 11/06/23 1917  GLUCAP 119*   Lipid Profile: No results for input(s): "CHOL", "HDL", "LDLCALC", "TRIG", "CHOLHDL", "LDLDIRECT" in the last 72 hours. Thyroid  Function Tests: Recent Labs    11/07/23 0024  TSH 4.523*   Anemia Panel: No results for input(s): "VITAMINB12", "FOLATE", "FERRITIN", "TIBC", "IRON", "RETICCTPCT" in the last 72 hours. Sepsis Labs: No results for input(s): "PROCALCITON", "LATICACIDVEN" in the last 168 hours.  Recent Results (from the past 240 hours)  MRSA Next Gen by PCR, Nasal     Status: None   Collection Time: 11/07/23 10:47 AM   Specimen: Nasal Mucosa; Nasal Swab  Result Value Ref Range Status   MRSA by PCR Next Gen NOT DETECTED NOT DETECTED Final    Comment: (NOTE) The GeneXpert MRSA Assay (FDA approved for NASAL specimens only), is one component of a comprehensive MRSA colonization surveillance program. It is not intended to diagnose MRSA infection nor to guide or monitor treatment for MRSA infections. Test performance is not FDA approved in patients less than 41 years old. Performed at [facility], [address] [place], [place] [address]          Radiology Studies: CT ABDOMEN PELVIS WO CONTRAST Result Date: 11/06/2023 CLINICAL DATA:  Abdominal pain postop EXAM: CT ABDOMEN AND PELVIS WITHOUT CONTRAST TECHNIQUE: Multidetector CT imaging of the abdomen and pelvis was performed following the standard protocol without IV contrast. RADIATION DOSE REDUCTION: This exam was performed according to the departmental dose-optimization program which includes automated exposure control, adjustment of the mA and/or kV according to patient size and/or use of iterative reconstruction technique. COMPARISON:  09/13/2022 FINDINGS: Lower chest: Cardiomegaly, aortic atherosclerosis. Scarring at the left lung base. No  effusions. Hepatobiliary: No focal liver abnormality is seen. Status post cholecystectomy. No biliary dilatation. Pancreas: No focal abnormality or ductal dilatation. Spleen: No focal abnormality.  Normal size. Adrenals/Urinary Tract: Adrenal glands normal. Atrophic kidneys with cortical thinning bilaterally. Bilateral renal cysts are stable. No follow-up imaging recommended. No hydronephrosis. Urinary bladder decompressed, grossly unremarkable. Stomach/Bowel: Stomach, large and small bowel grossly unremarkable. Vascular/Lymphatic: Heavily calcified aorta, iliac vessels and branch vessels. No evidence of aneurysm or adenopathy. Reproductive: No visible focal abnormality. Other: Small amount of free fluid in the abdomen and pelvis, possibly related to prior peritoneal dialysis catheter which has been recently removed. No free air. Musculoskeletal: No acute bony abnormality. IMPRESSION: Interval removal of previously seen peritoneal dialysis catheter. Small amount of residual free fluid likely related to prior PD catheter. No acute findings. Aortoiliac atherosclerosis. Electronically Signed  By: Janeece Mechanic M.D.   On: 11/06/2023 17:35   DG Chest Portable 1 View Result Date: 11/06/2023 CLINICAL DATA:  Shortness of breath EXAM: PORTABLE CHEST 1 VIEW COMPARISON:  08/05/2023 FINDINGS: Right dialysis catheter remains in place, unchanged. Prior CABG. Cardiomegaly, vascular congestion. Diffuse interstitial prominence could reflect interstitial edema. No effusions or acute bony abnormality. IMPRESSION: Cardiomegaly with vascular congestion and possible early interstitial edema. Electronically Signed   By: Janeece Mechanic M.D.   On: 11/06/2023 17:32        Scheduled Meds:  allopurinol   100 mg Oral QHS   amiodarone   200 mg Oral Daily   aspirin  EC  81 mg Oral Daily   atorvastatin   40 mg Oral Daily   Chlorhexidine  Gluconate Cloth  6 each Topical Q0600   doxepin   25 mg Oral QHS   heparin   5,000 Units Subcutaneous  Q8H   levothyroxine   75 mcg Oral Q0600   loratadine   10 mg Oral Daily   metaxalone   400 mg Oral BID   midodrine   5 mg Oral TID WC   pantoprazole   40 mg Oral BID   sodium chloride  flush  3 mL Intravenous Q12H   Continuous Infusions:  sodium chloride        LOS: 1 day    Time spent: 35 mins    Charli Halle Mason Sole, MD [facility] Pager [phone]  If 7PM-7AM, please contact night-coverage [email or web address]  11/07/2023, 6:14 PM

## 2023-11-07 NOTE — Progress Notes (Signed)
 Referring Provider: No ref. provider found Primary Care Physician:  Sheron Dixons, MD Primary Nephrologist:  Dr.   Lea Primmer for Consultation:    HPI: 78 year old male with history of hypertension, coronary artery disease, atrial fibrillation, congestive heart failure, history of nonsustained V. tach and end-stage renal disease on hemodialysis now comes to the emergency room with history of presyncopal episode.  He was found to have hyperkalemia of 6.3.  He received the hyperkalemia protocol.  Update Patient seen laying in bed No family present States he feels well Has not been out of bed yet today Patient seen later in morning, able to ambulate to bathroom with walker.  Denies dizziness.  Past Medical History:  Diagnosis Date   AAA (abdominal aortic aneurysm) (HCC)    a. 3cm by US  2015; b. 07/2023 Ao U/S: Abd Ao 2.6cm.   Arthritis    "hips; back" (12/13/2014)   CAD (coronary artery disease)    a. 1996 s/p CABGx4: LIMA-LAD, VG-Cx, VG-RCA, VG-diag; b. 2007 s/p redo CABGx2: VG-OM, VG-RCA due to VG disease; c. NSTEMI 11/2014 s/p DES to SVG-OM from the Y graft; d. 09/2015 PCI: distal body of SVG-Diag; e. 07/2017 Cath: LAD 100p, LCX 100p, RCA 95ost/100p, VG->dRCA 10p ISR, 30p/m/d, Y graft->OM3 (ok) & D2 42m, patent distal stent, LIMA->LAD ok, VG->OM2 100, VG->RPAV 100-->Med rx.   Chronic combined systolic and diastolic CHF (congestive heart failure) (HCC)    a. remote EF 40-45% in 2006. b. Normal EF 2014. c. Echo 07/2016 EF 45-50%, grade 1 DD. d. Echo 2020 30% to 35%; e. 03/2023 Echo: EF 20-25%, glob HK, inf wall best preserved. Mildly reduced RV fxn, RVSP 39.48mmHg. Mod dil LA. Mod MR/TR. AoV sclerosis.   Chronic lower back pain    CKD (chronic kidney disease), stage IV (HCC)    COPD (chronic obstructive pulmonary disease) (HCC)    Deafness in left ear    Degenerative disc disease, lumbar    Diabetes mellitus, type 2 (HCC) 10/04/2014   Microalbumin 05/11/2012-100. Foot exam/monofilament  05/11/2012-normal.   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 CHA2DS2VASc = 6-->eliquis .  On Amio.   PVC's (premature ventricular contractions)    Renal artery stenosis (HCC)    a. noted on CT 2008.   Type II diabetes mellitus (HCC)    Diet control    Unstable angina (HCC) 07/27/2017   Walking pneumonia 1990's   Wears dentures    full upper    Past Surgical History:  Procedure Laterality Date   CARDIAC CATHETERIZATION  "several"   CARDIAC CATHETERIZATION N/A 12/13/2014   Procedure: Left Heart Cath and Coronary Angiography;  Surgeon: Lucendia Rusk, MD;  Location: [facility] INVASIVE CV LAB;  Service: Cardiovascular;  Laterality: N/A;   CARDIAC CATHETERIZATION  12/13/2014   Procedure: Coronary Stent Intervention;  Surgeon: Lucendia Rusk, MD;  Location: [facility] INVASIVE CV LAB;  Service: Cardiovascular;;   CARDIAC CATHETERIZATION N/A 10/07/2015   Procedure: Left Heart Cath  and Cors/Grafts Angiography;  Surgeon: Odie Benne, MD;  Location: [facility] INVASIVE CV LAB;  Service: Cardiovascular;  Laterality: N/A;   CARDIAC CATHETERIZATION N/A 10/07/2015   Procedure: Coronary Stent Intervention;  Surgeon: Odie Benne, MD;  Location: [facility] INVASIVE CV LAB;  Service: Cardiovascular;  Laterality: N/A;   CARDIOVERSION N/A 05/25/2022   Procedure: CARDIOVERSION;  Surgeon: Jerryl Morin, DO;  Location: [facility] ENDOSCOPY;  Service: Cardiovascular;  Laterality: N/A;   CATARACT  EXTRACTION W/PHACO Left 10/21/2021   Procedure: CATARACT EXTRACTION PHACO AND INTRAOCULAR LENS PLACEMENT (IOC) LEFT 3.94 00:33.7;  Surgeon: Rosa College, MD;  Location: [facility];  Service: Ophthalmology;  Laterality: Left;   CATARACT EXTRACTION W/PHACO Right 11/09/2021   Procedure: CATARACT EXTRACTION PHACO AND INTRAOCULAR LENS PLACEMENT (IOC) RIGHT;  Surgeon: Rosa College, MD;  Location: [facility] SURGERY [facility];  Service: Ophthalmology;  Laterality: Right;  3.59 0:29.4   CORONARY ANGIOPLASTY  "several"   CORONARY ANGIOPLASTY WITH STENT PLACEMENT  2005; 12/13/2014   "2; 1"   CORONARY ARTERY BYPASS GRAFT  05/31/1994   CABG X5   CORONARY ARTERY BYPASS GRAFT  07/29/2005   CABG X3   DIALYSIS/PERMA CATHETER INSERTION N/A 09/10/2022   Procedure: DIALYSIS/PERMA CATHETER INSERTION;  Surgeon: Jackquelyn Mass, MD;  Location: [facility] INVASIVE CV LAB;  Service: Cardiovascular;  Laterality: N/A;   ESOPHAGOGASTRODUODENOSCOPY (EGD) WITH ESOPHAGEAL DILATION  05/31/1998   GREEN LIGHT LASER TURP (TRANSURETHRAL RESECTION OF PROSTATE  01/30/1999   "not cancerous"   HERNIA REPAIR     LAPAROSCOPIC CHOLECYSTECTOMY     LEFT HEART CATH AND CORS/GRAFTS ANGIOGRAPHY N/A 07/28/2017   Procedure: LEFT HEART CATH AND CORS/GRAFTS ANGIOGRAPHY;  Surgeon: Millicent Ally, MD;  Location: [facility] INVASIVE CV LAB;  Service: Cardiovascular;  Laterality: N/A;   LUNG SURGERY  05/31/1994   "S/P CABG, had to put staple in lung after it had collapsed"   MINOR REMOVAL OF PERITONEAL DIALYSIS CATHETER  10/2022   UMBILICAL HERNIA REPAIR     w/chole    Prior to Admission medications   Medication Sig Start Date End Date Taking? Authorizing Provider  acetaminophen  (TYLENOL ) 650 MG CR tablet Take 1,300 mg by mouth at bedtime.    [provider]  allopurinol  (ZYLOPRIM ) 100 MG tablet TAKE 1 TABLET BY MOUTH EVERYDAY AT BEDTIME 07/08/23   Sheron Dixons, MD  amiodarone  (PACERONE ) 400 MG tablet Take 1 tablet (400 mg  total) by mouth 2 (two) times daily for 7 days, THEN 0.5 tablets (200 mg total) 2 (two) times daily for 7 days, THEN 0.5 tablets (200 mg total) daily. 10/01/23 11/14/23  Jodeane Mulligan, DO  aspirin  EC 81 MG tablet Take 1 tablet (81 mg total) by mouth daily. Swallow whole. 10/02/23   Jodeane Mulligan, DO  atorvastatin  (LIPITOR) 40 MG tablet TAKE 1 TABLET BY MOUTH EVERYDAY AT BEDTIME 10/14/23   Berglund, Laura H, MD  cetirizine (ZYRTEC) 10 MG chewable tablet Chew 10 mg by mouth daily.    [provider]  Cholecalciferol  (VITAMIN D -3) 125 MCG (5000 UT) TABS Take 2,000 Units by mouth daily.     [provider]  doxepin  (SINEQUAN ) 25 MG capsule Take 1 capsule (25 mg total) by mouth at bedtime. 09/30/23   Sheron Dixons, MD  furosemide  (LASIX ) 40 MG tablet Take 40 mg by mouth daily as needed for edema. Taking 1 1/2 tablets (60mg  total) Pt takes on Tue. Thur. Sat.and Sunday's. 10/12/22   [provider]  levothyroxine  (SYNTHROID ) 75 MCG tablet  TAKE 1 TABLET BY MOUTH EVERY DAY BEFORE BREAKFAST 10/17/23   Sheron Dixons, MD  metaxalone  (SKELAXIN ) 400 MG tablet Take 1 tablet (400 mg total) by mouth in the morning and at bedtime. 10/07/23   Sheron Dixons, MD  midodrine  (PROAMATINE ) 5 MG tablet Take 1 tablet (5 mg total) by mouth 3 (three) times daily with meals. 08/09/23   Wouk, Haynes Lips, MD  Multiple Vitamin (MULTIVITAMIN PO) Take 1 tablet by mouth daily.    [provider]  nitroGLYCERIN  (NITROSTAT ) 0.4 MG SL tablet Place 0.4 mg under the tongue every 5 (five) minutes as needed for chest pain (Up to 3 times).    [provider]  pantoprazole  (PROTONIX ) 40 MG tablet Take 1 tablet (40 mg total) by mouth 2 (two) times daily. Home med. 07/23/23   Sheron Dixons, MD  senna (SENOKOT) 8.6 MG tablet Take 1 tablet by mouth as needed for constipation.    [provider]    Current Facility-Administered Medications  Medication Dose Route Frequency Provider Last  Rate Last Admin   0.9 %  sodium chloride  infusion  250 mL Intravenous PRN Acheampong, Peter K, MD       acetaminophen  (TYLENOL ) tablet 650 mg  650 mg Oral Q6H PRN Acheampong, Peter K, MD       Or   acetaminophen  (TYLENOL ) suppository 650 mg  650 mg Rectal Q6H PRN Acheampong, Peter K, MD       albuterol (PROVENTIL) (2.5 MG/3ML) 0.083% nebulizer solution 2.5 mg  2.5 mg Nebulization Q6H PRN Acheampong, Peter K, MD       allopurinol  (ZYLOPRIM ) tablet 100 mg  100 mg Oral QHS Acheampong, Peter K, MD       amiodarone  (PACERONE ) tablet 200 mg  200 mg Oral Daily Lubertha Rush, MD   200 mg at 11/07/23 8119   aspirin  EC tablet 81 mg  81 mg Oral Daily Acheampong, Peter K, MD   81 mg at 11/07/23 1478   atorvastatin  (LIPITOR) tablet 40 mg  40 mg Oral Daily Acheampong, Peter K, MD   40 mg at 11/07/23 0947   bisacodyl (DULCOLAX) EC tablet 5 mg  5 mg Oral Daily PRN Acheampong, Peter K, MD       Chlorhexidine  Gluconate Cloth 2 % PADS 6 each  6 each Topical Q0600 Korrapati, Madhu, MD       doxepin  (SINEQUAN ) capsule 25 mg  25 mg Oral QHS Acheampong, Peter K, MD       furosemide  (LASIX ) tablet 40 mg  40 mg Oral Daily PRN Acheampong, Peter K, MD       heparin  injection 1,000 Units  1,000 Units Intracatheter PRN Korrapati, Madhu, MD   3,300 Units at 11/06/23 2312   heparin  injection 5,000 Units  5,000 Units Subcutaneous Q8H Acheampong, Peter K, MD   5,000 Units at 11/07/23 1330   ipratropium (ATROVENT) nebulizer solution 0.5 mg  0.5 mg Nebulization Q6H Acheampong, Peter K, MD   0.5 mg at 11/07/23 1318   levothyroxine  (SYNTHROID ) tablet 75 mcg  75 mcg Oral Q0600 Acheampong, Peter K, MD   75 mcg at 11/07/23 2956   loratadine  (CLARITIN ) tablet 10 mg  10 mg Oral Daily Acheampong, Peter K, MD   10 mg at 11/07/23 0947   metaxalone  (SKELAXIN ) tablet 400 mg  400 mg Oral BID Acheampong, Peter K, MD   400 mg at 11/07/23 0947   midodrine  (PROAMATINE ) tablet 5 mg  5 mg Oral TID WC Irving Mantle  K, MD   5 mg at 11/07/23 1237    nitroGLYCERIN  (NITROSTAT ) SL tablet 0.4 mg  0.4 mg Sublingual Q5 min PRN Acheampong, Patricia Boon, MD       ondansetron  (ZOFRAN ) tablet 4 mg  4 mg Oral Q6H PRN Acheampong, Peter K, MD       Or   ondansetron  (ZOFRAN ) injection 4 mg  4 mg Intravenous Q6H PRN Acheampong, Patricia Boon, MD       pantoprazole  (PROTONIX ) EC tablet 40 mg  40 mg Oral BID Acheampong, Peter K, MD   40 mg at 11/07/23 0947   senna (SENOKOT) tablet 8.6 mg  1 tablet Oral Daily PRN Acheampong, Peter K, MD       sodium chloride  flush (NS) 0.9 % injection 3 mL  3 mL Intravenous Q12H Theodora Fish, MD   3 mL at 11/07/23 0954   sodium chloride  flush (NS) 0.9 % injection 3 mL  3 mL Intravenous PRN Acheampong, Peter K, MD        Allergies as of 11/06/2023 - Review Complete 11/06/2023  Allergen Reaction Noted   Predicort [prednisolone] Other (See Comments) 07/07/2017   Ciprofloxacin Other (See Comments) 04/18/2014   Hydrochlorothiazide Other (See Comments) 08/15/2008   Hydrocodone Nausea Only and Other (See Comments) 08/15/2008   Hydrocodone-acetaminophen  Nausea Only 08/15/2008   Sulfa antibiotics Other (See Comments) 10/04/2014   Penicillins Hives, Rash, and Other (See Comments) 08/15/2008    Family History  Problem Relation Age of Onset   Heart attack Mother        MI   Stroke Mother    Heart disease Mother    Hypertension Mother    Hyperlipidemia Mother    Asthma Mother    Heart disease Father    Rheumatic fever Father    Colon cancer Neg Hx     Social History   Socioeconomic History   Marital status: Married    Spouse name: Brendt Dible   Number of children: 2   Years of education: Not on file   Highest education level: 8th grade  Occupational History   Occupation: Retired  Tobacco Use   Smoking status: Former    Current packs/day: 0.00    Average packs/day: 3.0 packs/day for 20.0 years (60.0 ttl pk-yrs)    Types: Cigarettes    Start date: 07/25/1966    Quit date: 07/25/1986    Years since quitting:  37.3   Smokeless tobacco: Never  Vaping Use   Vaping status: Never Used  Substance and Sexual Activity   Alcohol use: No    Alcohol/week: 0.0 standard drinks of alcohol   Drug use: No   Sexual activity: Not Currently  Other Topics Concern   Not on file  Social History Narrative   Did auto salvage work.   Lives at home with his wife.  Independent at baseline.   Social Drivers of Corporate investment banker Strain: Low Risk  (06/23/2023)   Overall Financial Resource Strain (CARDIA)    Difficulty of Paying Living Expenses: Not hard at all  Food Insecurity: No Food Insecurity (11/07/2023)   Hunger Vital Sign    Worried About Running Out of Food in the Last Year: Never true    Ran Out of Food in the Last Year: Never true  Transportation Needs: No Transportation Needs (11/07/2023)   PRAPARE - Administrator, Civil Service (Medical): No    Lack of Transportation (Non-Medical): No  Physical Activity: Inactive (06/23/2023)   Exercise  Vital Sign    Days of Exercise per Week: 0 days    Minutes of Exercise per Session: 0 min  Stress: No Stress Concern Present (06/23/2023)   [name] of Occupational Health - Occupational Stress Questionnaire    Feeling of Stress : Only a little  Social Connections: Moderately Isolated (11/07/2023)   Social Connection and Isolation Panel [NHANES]    Frequency of Communication with Friends and Family: Twice a week    Frequency of Social Gatherings with Friends and Family: Three times a week    Attends Religious Services: Never    Active Member of Clubs or Organizations: No    Attends Banker Meetings: Never    Marital Status: Married  Catering manager Violence: Not At Risk (11/07/2023)   Humiliation, Afraid, Rape, and Kick questionnaire    Fear of Current or Ex-Partner: No    Emotionally Abused: No    Physically Abused: No    Sexually Abused: No    Physical Exam: Vital signs in last 24 hours: Temp:  [97.2 F (36.2 C)-98.1  F (36.7 C)] 98.1 F (36.7 C) (06/09 0747) Pulse Rate:  [67-127] 94 (06/09 0747) Resp:  [11-20] 16 (06/09 0747) BP: (103-133)/(54-69) 117/66 (06/09 0747) SpO2:  [95 %-100 %] 95 % (06/09 0802) Weight:  [97.4 kg-98.3 kg] 97.6 kg (06/08 2240) Last BM Date : 11/06/23 Intake/Output from previous day: 06/08 0701 - 06/09 0700 In: 3 [I.V.:3] Out: 1200  Intake/Output this shift: No intake/output data recorded.  Lab Results: Recent Labs    11/06/23 1706 11/07/23 0603  WBC 5.4 4.6  HGB 9.4* 8.8*  HCT 29.2* 26.7*  PLT 89* 81*   BMET Recent Labs    11/06/23 1706 11/06/23 1841 11/07/23 0024 11/07/23 0603  NA 136  --   --  136  K 6.3*  --  4.0 4.9  CL 100  --   --  97*  CO2 23  --   --  29  GLUCOSE 135* 195*  --  78  BUN 48*  --   --  27*  CREATININE 6.46*  --   --  4.37*  CALCIUM  9.2  --   --  8.8*   LFT Recent Labs    11/06/23 1706  PROT 6.9  ALBUMIN 3.4*  AST 44*  ALT 28  ALKPHOS 167*  BILITOT 1.5*   PT/INR No results for input(s): "LABPROT", "INR" in the last 72 hours. Hepatitis Panel Recent Labs    11/06/23 1841  HEPBSAG NON REACTIVE    Studies/Results: CT ABDOMEN PELVIS WO CONTRAST Result Date: 11/06/2023 CLINICAL DATA:  Abdominal pain postop EXAM: CT ABDOMEN AND PELVIS WITHOUT CONTRAST TECHNIQUE: Multidetector CT imaging of the abdomen and pelvis was performed following the standard protocol without IV contrast. RADIATION DOSE REDUCTION: This exam was performed according to the departmental dose-optimization program which includes automated exposure control, adjustment of the mA and/or kV according to patient size and/or use of iterative reconstruction technique. COMPARISON:  09/13/2022 FINDINGS: Lower chest: Cardiomegaly, aortic atherosclerosis. Scarring at the left lung base. No effusions. Hepatobiliary: No focal liver abnormality is seen. Status post cholecystectomy. No biliary dilatation. Pancreas: No focal abnormality or ductal dilatation. Spleen: No focal  abnormality.  Normal size. Adrenals/Urinary Tract: Adrenal glands normal. Atrophic kidneys with cortical thinning bilaterally. Bilateral renal cysts are stable. No follow-up imaging recommended. No hydronephrosis. Urinary bladder decompressed, grossly unremarkable. Stomach/Bowel: Stomach, large and small bowel grossly unremarkable. Vascular/Lymphatic: Heavily calcified aorta, iliac vessels and branch vessels. No evidence  of aneurysm or adenopathy. Reproductive: No visible focal abnormality. Other: Small amount of free fluid in the abdomen and pelvis, possibly related to prior peritoneal dialysis catheter which has been recently removed. No free air. Musculoskeletal: No acute bony abnormality. IMPRESSION: Interval removal of previously seen peritoneal dialysis catheter. Small amount of residual free fluid likely related to prior PD catheter. No acute findings. Aortoiliac atherosclerosis. Electronically Signed   By: Janeece Mechanic M.D.   On: 11/06/2023 17:35   DG Chest Portable 1 View Result Date: 11/06/2023 CLINICAL DATA:  Shortness of breath EXAM: PORTABLE CHEST 1 VIEW COMPARISON:  08/05/2023 FINDINGS: Right dialysis catheter remains in place, unchanged. Prior CABG. Cardiomegaly, vascular congestion. Diffuse interstitial prominence could reflect interstitial edema. No effusions or acute bony abnormality. IMPRESSION: Cardiomegaly with vascular congestion and possible early interstitial edema. Electronically Signed   By: Janeece Mechanic M.D.   On: 11/06/2023 17:32    Assessment/Plan:   78 year old male with history of hypertension, coronary artery disease, atrial fibrillation, congestive heart failure, history of nonsustained V. tach and end-stage renal disease on hemodialysis now comes to the emergency room with history of presyncopal episode.  He was found to have hyperkalemia of 6.3.  He received the hyperkalemia protocol.  ESRD with hyperkalemia on hemodialysis: Patient received urgent dialysis overnight,  tolerated treatment well.  Potassium corrected to 4.0.  If patient remains admitted, will consider short treatment is needed tomorrow.  ANEMIA with chronic kidney disease: Hemoglobin 8.8, slightly decreased.  Will consider low-dose ESA with dialysis.  Secondary hyperparathyroidism.  Outpatient PTH 236 on 10/26/2023.  Calcium  acceptable at this time.  Will continue to monitor bone minerals.  Hypertension with chronic kidney disease.  Blood pressure is well-controlled.    LOS: 1 [facility] @TODAY @1 :31 PM

## 2023-11-07 NOTE — Consult Note (Signed)
    Advanced Heart Failure Team Consult Note   Primary Physician: Sheron Dixons, MD PCP-Cardiologist:  Antoinette Batman, MD  Reason for Consultation: Tachyarrhythmia  HPI:    Ronald [name] is a 78 y.o. male with a PMH of  AAA, CAD, DM, hyperlipidemia, HTN, CKD, COPD, GERD, gout, pneumonia, atrial fibrillation, previous tobacco use and chronic heart failure who HF was consulted for recent episode of VT and presyncope  Patient presented with lightheadedness and dizziness over the course of the day prior to arrival. Low BP at baseline, but only takes midodrine  with iHD. Had not missed any recent sessions. Noted to have a BP in the 70s systolic so came to the ED. He denied any chest pain at the time. In the ED was in a wide complex tachycardia. Potassium noted to be above 6, but EKG did not change much with calcium . Currently in       Objective:    Vital Signs:   Temp:  [97.2 F (36.2 C)-98.1 F (36.7 C)] 97.9 F (36.6 C) (06/09 2000) Pulse Rate:  [88-96] 91 (06/09 2000) Resp:  [11-18] 18 (06/09 2000) BP: (98-133)/(52-66) 98/52 (06/09 2000) SpO2:  [95 %-100 %] 99 % (06/09 2000) Weight:  [97.6 kg] 97.6 kg (06/08 2240) Last BM Date : 11/06/23  Weight change: Filed Weights   11/06/23 1708 11/06/23 1951 11/06/23 2240  Weight: 97.4 kg 98.3 kg 97.6 kg    Intake/Output:   Intake/Output Summary (Last 24 hours) at 11/07/2023 2119 Last data filed at 11/07/2023 1300 Gross per 24 hour  Intake 483 ml  Output 1200 ml  Net -717 ml      Physical Exam    GENERAL: chronically ill appearing PULM:  Mild labored breathing  CARDIAC:  JVP: difficult to assess         regular rate and rhythm, systolic murmur ABDOMEN: Soft, non-tender, non-distended. NEUROLOGIC: Patient is oriented x3 with no focal or lateralizing neurologic deficits.      Patient Profile   Ronald [name] is a 78 y.o. male with a PMH of  AAA, CAD, DM, hyperlipidemia, HTN, CKD, COPD, GERD, gout, pneumonia,  atrial fibrillation, previous tobacco use and chronic heart failure who HF was consulted for recent episode of VT and presyncope  Assessment/Plan   VT: Compared to previous rhythm, slower now likely as a result of the amiodarone . Given clear change in axis, capture beats, AV disassociation, extremely wide QRS and no aberrancy on baseline EKG this is VT. Suspect scar mediated, do not believe hyperK was contributory as ESRD is relatively protective. Discussed with patient, not a candidate for ICD. - Start mexiletine 200mg  BID for suppression - Would increase amiodarone  to 400mg  daily for VT suppression - orthostatic hypotension with BB in the past - End stage, agree with palliative consult, this will likely progress. Did discuss with wife and patient  Heart failure with reduced EF: Likely secondary to known ischemia, unable to tolerate GDMT with his ERSD, worsening functional status.  -Continue midodrine  for BP support -Not an ICD candidate - Continue iHD  PAF: cardioverted 04/2022, Not able to be on apixaban  given multiple bleeding episodes.    ESRD:  - Nephrology following.  Length of Stay: 1  Lauralee Poll, MD  11/07/2023, 9:19 PM  Advanced Heart Failure Team Pager [phone] (M-F; 7a - 5p)  Please contact [facility] for night-coverage after hours (4p -7a ) and weekends on [email or web address]

## 2023-11-08 ENCOUNTER — Other Ambulatory Visit: Payer: Self-pay

## 2023-11-08 DIAGNOSIS — R9431 Abnormal electrocardiogram [ECG] [EKG]: Secondary | ICD-10-CM | POA: Diagnosis not present

## 2023-11-08 DIAGNOSIS — N186 End stage renal disease: Secondary | ICD-10-CM | POA: Diagnosis not present

## 2023-11-08 DIAGNOSIS — R000 Tachycardia, unspecified: Secondary | ICD-10-CM | POA: Diagnosis not present

## 2023-11-08 DIAGNOSIS — E875 Hyperkalemia: Secondary | ICD-10-CM | POA: Diagnosis not present

## 2023-11-08 LAB — BASIC METABOLIC PANEL WITH GFR
Anion gap: 15 (ref 5–15)
BUN: 41 mg/dL — ABNORMAL HIGH (ref 8–23)
CO2: 25 mmol/L (ref 22–32)
Calcium: 8.9 mg/dL (ref 8.9–10.3)
Chloride: 95 mmol/L — ABNORMAL LOW (ref 98–111)
Creatinine, Ser: 5.86 mg/dL — ABNORMAL HIGH (ref 0.61–1.24)
GFR, Estimated: 9 mL/min — ABNORMAL LOW (ref >60)
Glucose, Bld: 91 mg/dL (ref 70–99)
Potassium: 4.8 mmol/L (ref 3.5–5.1)
Sodium: 135 mmol/L (ref 135–145)

## 2023-11-08 LAB — CBC
HCT: 28.2 % — ABNORMAL LOW (ref 39.0–52.0)
Hemoglobin: 9.2 g/dL — ABNORMAL LOW (ref 13.0–17.0)
MCH: 30.1 pg (ref 26.0–34.0)
MCHC: 32.6 g/dL (ref 30.0–36.0)
MCV: 92.2 fL (ref 80.0–100.0)
Platelets: 86 10*3/uL — ABNORMAL LOW (ref 150–400)
RBC: 3.06 MIL/uL — ABNORMAL LOW (ref 4.22–5.81)
RDW: 16.3 % — ABNORMAL HIGH (ref 11.5–15.5)
WBC: 4.8 10*3/uL (ref 4.0–10.5)
nRBC: 0 % (ref 0.0–0.2)

## 2023-11-08 LAB — HEPATITIS B SURFACE ANTIBODY, QUANTITATIVE: Hep B S AB Quant (Post): <3.5 m[IU]/mL — ABNORMAL LOW

## 2023-11-08 MED ORDER — AMIODARONE HCL 200 MG PO TABS
400.0000 mg | ORAL_TABLET | Freq: Every day | ORAL | 0 refills | Status: DC
  Filled 2023-11-08: qty 60, 30d supply, fill #0

## 2023-11-08 MED ORDER — MIDODRINE HCL 5 MG PO TABS
5.0000 mg | ORAL_TABLET | Freq: Three times a day (TID) | ORAL | 0 refills | Status: AC
Start: 2023-11-08 — End: 2023-12-08
  Filled 2023-11-08: qty 90, 30d supply, fill #0

## 2023-11-08 MED ORDER — MORPHINE SULFATE (PF) 2 MG/ML IV SOLN
1.0000 mg | INTRAVENOUS | Status: AC
  Administered 2023-11-08: 1 mg via INTRAVENOUS
  Filled 2023-11-08: qty 1

## 2023-11-08 MED ORDER — MEXILETINE HCL 200 MG PO CAPS
200.0000 mg | ORAL_CAPSULE | Freq: Two times a day (BID) | ORAL | 0 refills | Status: DC
  Filled 2023-11-08: qty 60, 30d supply, fill #0

## 2023-11-08 NOTE — Progress Notes (Signed)
    Advanced Heart Failure Rounding Note  PCP-Cardiologist: Antoinette Batman, MD   Subjective:     Overall no complaints. He is feeling well, hopeful to discharge. Discussion with wife and patient today regarding palliative care referral. They expressed an understanding that he is near the end of life and would like to avoid future hospitalizations. Agree that DNR is appropriate. Will follow up in clinic.    Objective:   Weight Range: 97.6 kg Body mass index is 31.77 kg/m.   Vital Signs:   Temp:  [97.2 F (36.2 C)-98.1 F (36.7 C)] 97.2 F (36.2 C) (06/10 0744) Pulse Rate:  [85-92] 86 (06/10 0744) Resp:  [18] 18 (06/10 0344) BP: (98-107)/(52-59) 104/53 (06/10 0744) SpO2:  [95 %-100 %] 97 % (06/10 0744) Last BM Date : 11/06/23  Weight change: Filed Weights   11/06/23 1708 11/06/23 1951 11/06/23 2240  Weight: 97.4 kg 98.3 kg 97.6 kg    Intake/Output:   Intake/Output Summary (Last 24 hours) at 11/08/2023 1041 Last data filed at 11/08/2023 1032 Gross per 24 hour  Intake 720 ml  Output --  Net 720 ml      Physical Exam    General:  chronically ill appearing Cor: RRR, no m/g/r, systolic murmur, JVP flat. trace LE edema Lungs: Normal work of breathing, clear to auscultation bilaterally Abdomen: soft, nontender, nondistended Extremities: no cyanosis, clubbing, rash Neuro: alert & orientedx3, cranial nerves grossly intact. moves all 4 extremities w/o difficulty. Affect pleasant   Telemetry   No further VT  Patient Profile   Ronald [name] is a 78 y.o. male with a PMH of  AAA, CAD, DM, hyperlipidemia, HTN, CKD, COPD, GERD, gout, pneumonia, atrial fibrillation, previous tobacco use and chronic heart failure who HF was consulted for recent episode of VT and presyncope    Assessment/Plan   VT: Compared to previous rhythm rates now slower now as a result of the amiodarone . Discussed that despite medical therapy it will likely recur. Not a candidate for  defibrillator therapy. Palliative referral placed at discharge. Overall stable, no additional medical therapy from a HF standpoint. Can discharge, follow up has been arranged.  - Continue mexiletine 200mg  BID for suppression - Continue increased dose of amiodarone  400mg  daily - orthostatic hypotension with BB in the past - End stage, agree with palliative consult, this will likely progress. Did discuss with wife and patient again today   Heart failure with reduced EF: Likely secondary to known ischemia, unable to tolerate GDMT with his ERSD, worsening functional status.  -Continue midodrine  for BP support -Not an ICD candidate - Continue iHD  PAF: cardioverted 04/2022, Not able to be on apixaban  given multiple bleeding episodes.    ESRD:  - Nephrology following.   HF will sign off at this time  Length of Stay: 2  Lauralee Poll, MD  11/08/2023, 10:41 AM  Advanced Heart Failure Team Pager [phone] (M-F; 7a - 5p)  Please contact [facility] for night-coverage after hours (5p -7a ) and weekends on [email or web address]

## 2023-11-08 NOTE — Progress Notes (Signed)
 Referring Provider: No ref. provider found Primary Care Physician:  Sheron Dixons, MD Primary Nephrologist:  Dr.   Lea Primmer for Consultation:    HPI: 78 year old male with history of hypertension, coronary artery disease, atrial fibrillation, congestive heart failure, history of nonsustained V. tach and end-stage renal disease on hemodialysis now comes to the emergency room with history of presyncopal episode.  He was found to have hyperkalemia of 6.3.  He received the hyperkalemia protocol.  Update Patient seen sitting up in bed Wife at bedside Alert and oriented Remains on room air, denies shortness of breath No lower extremity edema  Past Medical History:  Diagnosis Date   AAA (abdominal aortic aneurysm) (HCC)    a. 3cm by US  2015; b. 07/2023 Ao U/S: Abd Ao 2.6cm.   Arthritis    "hips; back" (12/13/2014)   CAD (coronary artery disease)    a. 1996 s/p CABGx4: LIMA-LAD, VG-Cx, VG-RCA, VG-diag; b. 2007 s/p redo CABGx2: VG-OM, VG-RCA due to VG disease; c. NSTEMI 11/2014 s/p DES to SVG-OM from the Y graft; d. 09/2015 PCI: distal body of SVG-Diag; e. 07/2017 Cath: LAD 100p, LCX 100p, RCA 95ost/100p, VG->dRCA 10p ISR, 30p/m/d, Y graft->OM3 (ok) & D2 9m, patent distal stent, LIMA->LAD ok, VG->OM2 100, VG->RPAV 100-->Med rx.   Chronic combined systolic and diastolic CHF (congestive heart failure) (HCC)    a. remote EF 40-45% in 2006. b. Normal EF 2014. c. Echo 07/2016 EF 45-50%, grade 1 DD. d. Echo 2020 30% to 35%; e. 03/2023 Echo: EF 20-25%, glob HK, inf wall best preserved. Mildly reduced RV fxn, RVSP 39.74mmHg. Mod dil LA. Mod MR/TR. AoV sclerosis.   Chronic lower back pain    CKD (chronic kidney disease), stage IV (HCC)    COPD (chronic obstructive pulmonary disease) (HCC)    Deafness in left ear    Degenerative disc disease, lumbar    Diabetes mellitus, type 2 (HCC) 10/04/2014   Microalbumin 05/11/2012-100. Foot exam/monofilament 05/11/2012-normal.   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    Emphysema    Esophageal stricture 07/02/1998   EGD   Genital candidiasis in male 10/25/2012   GERD (gastroesophageal reflux disease)    History of gout    "last flareup was in 2007" (12/13/2014)   History of hiatal hernia    Hyperlipidemia    Hypertension    Ischemic cardiomyopathy 2006   Echo 2020: EF 30-35%, diffuse hypokinesis   Ischemic cardiomyopathy    Myocardial infarction (HCC) 12/13/2014   NSTEMI (non-ST elevated myocardial infarction) (HCC) 12/13/2014   PAF (paroxysmal atrial fibrillation) (HCC)    a. CHA2DS2VASc = 6-->eliquis .  On Amio.   PVC's (premature ventricular contractions)    Renal artery stenosis (HCC)    a. noted on CT 2008.   Type II diabetes mellitus (HCC)    Diet control    Unstable angina (HCC) 07/27/2017   Walking pneumonia 1990's   Wears dentures    full upper    Past Surgical History:  Procedure Laterality Date   CARDIAC CATHETERIZATION  "several"   CARDIAC CATHETERIZATION N/A 12/13/2014   Procedure: Left Heart Cath and Coronary Angiography;  Surgeon: Lucendia Rusk, MD;  Location: [facility] INVASIVE CV LAB;  Service: Cardiovascular;  Laterality: N/A;   CARDIAC CATHETERIZATION  12/13/2014   Procedure: Coronary Stent Intervention;  Surgeon: Lucendia Rusk, MD;  Location: [facility] INVASIVE CV LAB;  Service: Cardiovascular;;   CARDIAC CATHETERIZATION N/A 10/07/2015   Procedure: Left Heart Cath and Cors/Grafts Angiography;  Surgeon: Odie Benne, MD;  Location:  [facility] INVASIVE CV LAB;  Service: Cardiovascular;  Laterality: N/A;   CARDIAC CATHETERIZATION N/A 10/07/2015   Procedure: Coronary Stent Intervention;  Surgeon: Odie Benne, MD;  Location: [facility] INVASIVE CV LAB;  Service: Cardiovascular;  Laterality: N/A;   CARDIOVERSION N/A 05/25/2022   Procedure: CARDIOVERSION;  Surgeon: Jerryl Morin, DO;  Location: [facility] ENDOSCOPY;  Service: Cardiovascular;  Laterality: N/A;   CATARACT EXTRACTION W/PHACO Left 10/21/2021   Procedure: CATARACT  EXTRACTION PHACO AND INTRAOCULAR LENS PLACEMENT (IOC) LEFT 3.94 00:33.7;  Surgeon: Rosa College, MD;  Location: [facility];  Service: Ophthalmology;  Laterality: Left;   CATARACT EXTRACTION W/PHACO Right 11/09/2021   Procedure: CATARACT EXTRACTION PHACO AND INTRAOCULAR LENS PLACEMENT (IOC) RIGHT;  Surgeon: Rosa College, MD;  Location: [facility];  Service: Ophthalmology;  Laterality: Right;  3.59 0:29.4   CORONARY ANGIOPLASTY  "several"   CORONARY ANGIOPLASTY WITH STENT PLACEMENT  2005; 12/13/2014   "2; 1"   CORONARY ARTERY BYPASS GRAFT  05/31/1994   CABG X5   CORONARY ARTERY BYPASS GRAFT  07/29/2005   CABG X3   DIALYSIS/PERMA CATHETER INSERTION N/A 09/10/2022   Procedure: DIALYSIS/PERMA CATHETER INSERTION;  Surgeon: Jackquelyn Mass, MD;  Location: [facility] INVASIVE CV LAB;  Service: Cardiovascular;  Laterality: N/A;   ESOPHAGOGASTRODUODENOSCOPY (EGD) WITH ESOPHAGEAL DILATION  05/31/1998   GREEN LIGHT LASER TURP (TRANSURETHRAL RESECTION OF PROSTATE  01/30/1999   "not cancerous"   HERNIA REPAIR     LAPAROSCOPIC CHOLECYSTECTOMY     LEFT HEART CATH AND CORS/GRAFTS ANGIOGRAPHY N/A 07/28/2017   Procedure: LEFT HEART CATH AND CORS/GRAFTS ANGIOGRAPHY;  Surgeon: Millicent Ally, MD;  Location: [facility] INVASIVE CV LAB;  Service: Cardiovascular;  Laterality: N/A;   LUNG SURGERY  05/31/1994   "S/P CABG, had to put staple in lung after it had collapsed"   MINOR REMOVAL OF PERITONEAL DIALYSIS CATHETER  10/2022   UMBILICAL HERNIA REPAIR     w/chole    Prior to Admission medications   Medication Sig Start Date End Date Taking? Authorizing Provider  acetaminophen  (TYLENOL ) 650 MG CR tablet Take 1,300 mg by mouth at bedtime.    [provider]  allopurinol  (ZYLOPRIM ) 100 MG tablet TAKE 1 TABLET BY MOUTH EVERYDAY AT BEDTIME 07/08/23   Sheron Dixons, MD  amiodarone  (PACERONE ) 400 MG tablet Take 1 tablet (400 mg total) by mouth 2 (two) times daily for 7 days, THEN 0.5  tablets (200 mg total) 2 (two) times daily for 7 days, THEN 0.5 tablets (200 mg total) daily. 10/01/23 11/14/23  Jodeane Mulligan, DO  aspirin  EC 81 MG tablet Take 1 tablet (81 mg total) by mouth daily. Swallow whole. 10/02/23   Jodeane Mulligan, DO  atorvastatin  (LIPITOR) 40 MG tablet TAKE 1 TABLET BY MOUTH EVERYDAY AT BEDTIME 10/14/23   Berglund, Laura H, MD  cetirizine (ZYRTEC) 10 MG chewable tablet Chew 10 mg by mouth daily.    [provider]  Cholecalciferol  (VITAMIN D -3) 125 MCG (5000 UT) TABS Take 2,000 Units by mouth daily.     [provider]  doxepin  (SINEQUAN ) 25 MG capsule Take 1 capsule (25 mg total) by mouth at bedtime. 09/30/23   Sheron Dixons, MD  furosemide  (LASIX ) 40 MG tablet Take 40 mg by mouth daily as needed for edema. Taking 1 1/2 tablets (60mg  total) Pt takes on Tue. Thur. Sat.and Sunday's. 10/12/22   [provider]  levothyroxine  (SYNTHROID ) 75 MCG tablet TAKE 1 TABLET BY MOUTH EVERY DAY BEFORE BREAKFAST 10/17/23  Sheron Dixons, MD  metaxalone  (SKELAXIN ) 400 MG tablet Take 1 tablet (400 mg total) by mouth in the morning and at bedtime. 10/07/23   Sheron Dixons, MD  midodrine  (PROAMATINE ) 5 MG tablet Take 1 tablet (5 mg total) by mouth 3 (three) times daily with meals. 08/09/23   Wouk, Haynes Lips, MD  Multiple Vitamin (MULTIVITAMIN PO) Take 1 tablet by mouth daily.    [provider]  nitroGLYCERIN  (NITROSTAT ) 0.4 MG SL tablet Place 0.4 mg under the tongue every 5 (five) minutes as needed for chest pain (Up to 3 times).    [provider]  pantoprazole  (PROTONIX ) 40 MG tablet Take 1 tablet (40 mg total) by mouth 2 (two) times daily. Home med. 07/23/23   Sheron Dixons, MD  senna (SENOKOT) 8.6 MG tablet Take 1 tablet by mouth as needed for constipation.    [provider]    Current Facility-Administered Medications  Medication Dose Route Frequency Provider Last Rate Last Admin   acetaminophen  (TYLENOL ) tablet 650 mg   650 mg Oral Q6H PRN Acheampong, Peter K, MD       Or   acetaminophen  (TYLENOL ) suppository 650 mg  650 mg Rectal Q6H PRN Acheampong, Peter K, MD       albuterol (PROVENTIL) (2.5 MG/3ML) 0.083% nebulizer solution 2.5 mg  2.5 mg Nebulization Q6H PRN Acheampong, Peter K, MD       allopurinol  (ZYLOPRIM ) tablet 100 mg  100 mg Oral QHS Acheampong, Peter K, MD   100 mg at 11/07/23 2156   amiodarone  (PACERONE ) tablet 400 mg  400 mg Oral Daily Stoner, Benjamin J, MD   400 mg at 11/08/23 0944   aspirin  EC tablet 81 mg  81 mg Oral Daily Acheampong, Peter K, MD   81 mg at 11/08/23 0944   atorvastatin  (LIPITOR) tablet 40 mg  40 mg Oral Daily Acheampong, Peter K, MD   40 mg at 11/08/23 0944   bisacodyl (DULCOLAX) EC tablet 5 mg  5 mg Oral Daily PRN Acheampong, Peter K, MD       Chlorhexidine  Gluconate Cloth 2 % PADS 6 each  6 each Topical Q0600 Korrapati, Madhu, MD   6 each at 11/08/23 0616   doxepin  (SINEQUAN ) capsule 25 mg  25 mg Oral QHS Acheampong, Peter K, MD   25 mg at 11/07/23 2157   furosemide  (LASIX ) tablet 40 mg  40 mg Oral Daily PRN Acheampong, Peter K, MD       heparin  injection 1,000 Units  1,000 Units Intracatheter PRN Korrapati, Madhu, MD   3,300 Units at 11/06/23 2312   heparin  injection 5,000 Units  5,000 Units Subcutaneous Q8H Acheampong, Peter K, MD   5,000 Units at 11/08/23 0551   levothyroxine  (SYNTHROID ) tablet 75 mcg  75 mcg Oral Q0600 Acheampong, Peter K, MD   75 mcg at 11/08/23 0552   loratadine  (CLARITIN ) tablet 10 mg  10 mg Oral Daily Acheampong, Peter K, MD   10 mg at 11/08/23 0944   metaxalone  (SKELAXIN ) tablet 400 mg  400 mg Oral BID Acheampong, Peter K, MD   400 mg at 11/08/23 0944   mexiletine (MEXITIL) capsule 200 mg  200 mg Oral Q12H Stoner, Benjamin J, MD   200 mg at 11/08/23 0944   midodrine  (PROAMATINE ) tablet 5 mg  5 mg Oral TID WC Acheampong, Peter K, MD   5 mg at 11/08/23 1224   nitroGLYCERIN  (NITROSTAT ) SL tablet 0.4 mg  0.4 mg Sublingual Q5 min PRN Acheampong,  Patricia Boon,  MD   0.4 mg at 11/08/23 1055   ondansetron  (ZOFRAN ) tablet 4 mg  4 mg Oral Q6H PRN Acheampong, Patricia Boon, MD       Or   ondansetron  (ZOFRAN ) injection 4 mg  4 mg Intravenous Q6H PRN Acheampong, Patricia Boon, MD       pantoprazole  (PROTONIX ) EC tablet 40 mg  40 mg Oral BID Acheampong, Peter K, MD   40 mg at 11/08/23 0944   senna (SENOKOT) tablet 8.6 mg  1 tablet Oral Daily PRN Theodora Fish, MD       sodium chloride  flush (NS) 0.9 % injection 3 mL  3 mL Intravenous Q12H Theodora Fish, MD   3 mL at 11/07/23 2158   sodium chloride  flush (NS) 0.9 % injection 3 mL  3 mL Intravenous PRN Acheampong, Peter K, MD        Allergies as of 11/06/2023 - Review Complete 11/06/2023  Allergen Reaction Noted   Predicort [prednisolone] Other (See Comments) 07/07/2017   Ciprofloxacin Other (See Comments) 04/18/2014   Hydrochlorothiazide Other (See Comments) 08/15/2008   Hydrocodone Nausea Only and Other (See Comments) 08/15/2008   Hydrocodone-acetaminophen  Nausea Only 08/15/2008   Sulfa antibiotics Other (See Comments) 10/04/2014   Penicillins Hives, Rash, and Other (See Comments) 08/15/2008    Family History  Problem Relation Age of Onset   Heart attack Mother        MI   Stroke Mother    Heart disease Mother    Hypertension Mother    Hyperlipidemia Mother    Asthma Mother    Heart disease Father    Rheumatic fever Father    Colon cancer Neg Hx     Social History   Socioeconomic History   Marital status: Married    Spouse name: Keveon Amsler   Number of children: 2   Years of education: Not on file   Highest education level: 8th grade  Occupational History   Occupation: Retired  Tobacco Use   Smoking status: Former    Current packs/day: 0.00    Average packs/day: 3.0 packs/day for 20.0 years (60.0 ttl pk-yrs)    Types: Cigarettes    Start date: 07/25/1966    Quit date: 07/25/1986    Years since quitting: 37.3   Smokeless tobacco: Never  Vaping Use   Vaping status: Never Used   Substance and Sexual Activity   Alcohol use: No    Alcohol/week: 0.0 standard drinks of alcohol   Drug use: No   Sexual activity: Not Currently  Other Topics Concern   Not on file  Social History Narrative   Did auto salvage work.   Lives at home with his wife.  Independent at baseline.   Social Drivers of Corporate investment banker Strain: Low Risk  (06/23/2023)   Overall Financial Resource Strain (CARDIA)    Difficulty of Paying Living Expenses: Not hard at all  Food Insecurity: No Food Insecurity (11/07/2023)   Hunger Vital Sign    Worried About Running Out of Food in the Last Year: Never true    Ran Out of Food in the Last Year: Never true  Transportation Needs: No Transportation Needs (11/07/2023)   PRAPARE - Administrator, Civil Service (Medical): No    Lack of Transportation (Non-Medical): No  Physical Activity: Inactive (06/23/2023)   Exercise Vital Sign    Days of Exercise per Week: 0 days    Minutes of Exercise per Session: 0 min  Stress: No Stress Concern Present (06/23/2023)   [name] of Occupational Health - Occupational Stress Questionnaire    Feeling of Stress : Only a little  Social Connections: Moderately Isolated (11/07/2023)   Social Connection and Isolation Panel [NHANES]    Frequency of Communication with Friends and Family: Twice a week    Frequency of Social Gatherings with Friends and Family: Three times a week    Attends Religious Services: Never    Active Member of Clubs or Organizations: No    Attends Banker Meetings: Never    Marital Status: Married  Catering manager Violence: Not At Risk (11/07/2023)   Humiliation, Afraid, Rape, and Kick questionnaire    Fear of Current or Ex-Partner: No    Emotionally Abused: No    Physically Abused: No    Sexually Abused: No    Physical Exam: Vital signs in last 24 hours: Temp:  [97.2 F (36.2 C)-98.1 F (36.7 C)] 97.9 F (36.6 C) (06/10 1207) Pulse Rate:  [85-96] 96  (06/10 1207) Resp:  [18] 18 (06/10 0344) BP: (98-108)/(52-59) 108/57 (06/10 1207) SpO2:  [95 %-100 %] 96 % (06/10 1207) Last BM Date : 11/06/23 Intake/Output from previous day: 06/09 0701 - 06/10 0700 In: 480 [P.O.:480] Out: -  Intake/Output this shift: Total I/O In: 240 [P.O.:240] Out: -   Lab Results: Recent Labs    11/06/23 1706 11/07/23 0603 11/08/23 0454  WBC 5.4 4.6 4.8  HGB 9.4* 8.8* 9.2*  HCT 29.2* 26.7* 28.2*  PLT 89* 81* 86*   BMET Recent Labs    11/06/23 1706 11/06/23 1841 11/07/23 0024 11/07/23 0603 11/08/23 0454  NA 136  --   --  136 135  K 6.3*  --  4.0 4.9 4.8  CL 100  --   --  97* 95*  CO2 23  --   --  29 25  GLUCOSE 135* 195*  --  78 91  BUN 48*  --   --  27* 41*  CREATININE 6.46*  --   --  4.37* 5.86*  CALCIUM  9.2  --   --  8.8* 8.9   LFT Recent Labs    11/06/23 1706  PROT 6.9  ALBUMIN 3.4*  AST 44*  ALT 28  ALKPHOS 167*  BILITOT 1.5*   PT/INR No results for input(s): "LABPROT", "INR" in the last 72 hours. Hepatitis Panel Recent Labs    11/06/23 1841  HEPBSAG NON REACTIVE    Studies/Results: CT ABDOMEN PELVIS WO CONTRAST Result Date: 11/06/2023 CLINICAL DATA:  Abdominal pain postop EXAM: CT ABDOMEN AND PELVIS WITHOUT CONTRAST TECHNIQUE: Multidetector CT imaging of the abdomen and pelvis was performed following the standard protocol without IV contrast. RADIATION DOSE REDUCTION: This exam was performed according to the departmental dose-optimization program which includes automated exposure control, adjustment of the mA and/or kV according to patient size and/or use of iterative reconstruction technique. COMPARISON:  09/13/2022 FINDINGS: Lower chest: Cardiomegaly, aortic atherosclerosis. Scarring at the left lung base. No effusions. Hepatobiliary: No focal liver abnormality is seen. Status post cholecystectomy. No biliary dilatation. Pancreas: No focal abnormality or ductal dilatation. Spleen: No focal abnormality.  Normal size.  Adrenals/Urinary Tract: Adrenal glands normal. Atrophic kidneys with cortical thinning bilaterally. Bilateral renal cysts are stable. No follow-up imaging recommended. No hydronephrosis. Urinary bladder decompressed, grossly unremarkable. Stomach/Bowel: Stomach, large and small bowel grossly unremarkable. Vascular/Lymphatic: Heavily calcified aorta, iliac vessels and branch vessels. No evidence of aneurysm or adenopathy. Reproductive: No visible focal abnormality. Other: Small amount  of free fluid in the abdomen and pelvis, possibly related to prior peritoneal dialysis catheter which has been recently removed. No free air. Musculoskeletal: No acute bony abnormality. IMPRESSION: Interval removal of previously seen peritoneal dialysis catheter. Small amount of residual free fluid likely related to prior PD catheter. No acute findings. Aortoiliac atherosclerosis. Electronically Signed   By: Janeece Mechanic M.D.   On: 11/06/2023 17:35   DG Chest Portable 1 View Result Date: 11/06/2023 CLINICAL DATA:  Shortness of breath EXAM: PORTABLE CHEST 1 VIEW COMPARISON:  08/05/2023 FINDINGS: Right dialysis catheter remains in place, unchanged. Prior CABG. Cardiomegaly, vascular congestion. Diffuse interstitial prominence could reflect interstitial edema. No effusions or acute bony abnormality. IMPRESSION: Cardiomegaly with vascular congestion and possible early interstitial edema. Electronically Signed   By: Janeece Mechanic M.D.   On: 11/06/2023 17:32    Assessment/Plan:   78 year old male with history of hypertension, coronary artery disease, atrial fibrillation, congestive heart failure, history of nonsustained V. tach and end-stage renal disease on hemodialysis now comes to the emergency room with history of presyncopal episode.  He was found to have hyperkalemia of 6.3.  He received the hyperkalemia protocol.  ESRD with hyperkalemia on hemodialysis: Next dialysis treatment scheduled for Wednesday.  ANEMIA with chronic  kidney disease: Patient does receive Mircera at outpatient clinic.  Hemoglobin currently 9.2, acceptable.  Secondary hyperparathyroidism.  Outpatient PTH 236 on 10/26/2023.  Calcium  levels within optimal range.  Hypertension with chronic kidney disease.  Blood pressure currently 108/57.  Currently receiving amiodarone , furosemide  and midodrine .    LOS: 2 [facility] @TODAY @12 :57 PM

## 2023-11-08 NOTE — Plan of Care (Signed)
  Problem: Education: Goal: Knowledge of General Education information will improve Description: Including pain rating scale, medication(s)/side effects and non-pharmacologic comfort measures Outcome: Progressing   Problem: Clinical Measurements: Goal: Ability to maintain clinical measurements within normal limits will improve Outcome: Progressing   Problem: Clinical Measurements: Goal: Will remain free from infection Outcome: Progressing   Problem: Clinical Measurements: Goal: Diagnostic test results will improve Outcome: Progressing   Problem: Clinical Measurements: Goal: Respiratory complications will improve Outcome: Progressing   Problem: Clinical Measurements: Goal: Cardiovascular complication will be avoided Outcome: Progressing   Problem: Activity: Goal: Risk for activity intolerance will decrease Outcome: Progressing   Problem: Coping: Goal: Level of anxiety will decrease Outcome: Progressing   Problem: Safety: Goal: Ability to remain free from injury will improve Outcome: Progressing   Plan of care ongoing, see MAR see flowsheet

## 2023-11-08 NOTE — TOC CM/SW Note (Signed)
     [facility] REFERRAL        Occupational Therapy * Physical Therapy * Speech Therapy                           DATE 11/08/2023  PATIENT NAME Ronald [name] PATIENT MRN [id number]       DIAGNOSIS/DIAGNOSIS CODE R00.0, E87.5, R94.31  DATE OF DISCHARGE: 11/08/2023       PRIMARY CARE PHYSICIAN Janna Melter, MD  PCP PHONE/FAX Phone: [phone]     Dear Provider (Name: [facility] [place]  Fax: [phone])   I certify that I have examined this patient and that occupational/physical/speech therapy is necessary on an outpatient basis.    The patient has expressed interest in completing their recommended course of therapy at your  location.  Once a formal order from the patient's primary care physician has been obtained, please  contact him/her to schedule an appointment for evaluation at your earliest convenience.   [ x]  Physical Therapy Evaluate and Treat  [  ]  Occupational Therapy Evaluate and Treat  [  ]  Speech Therapy Evaluate and Treat         The patient's primary care physician (listed above) must furnish and be responsible for a formal order such that the recommended services may be furnished while under the primary physician's care, and that the plan of care will be established and reviewed every 30 days (or more often if condition necessitates).

## 2023-11-08 NOTE — TOC Transition Note (Signed)
 Transition of Care [facility]) - Discharge Note   Patient Details  Name: Ronald [name] [facility] MRN: [id number] Date of Birth: 1946/04/10  Transition of Care [facility]) CM/SW Contact:  Odilia Bennett, LCSW Phone Number: 11/08/2023, 10:24 AM   Clinical Narrative:   Patient has orders to discharge home today. CSW met with patient and wife. They confirmed that he already has appointments scheduled for outpatient PT at [facility]. CSW faxed order form to office. They are agreeable to outpatient palliative follow up and prefer [facility]. Referral made to [facility]. No further concerns. CSW signing off.  Final next level of care: OP Rehab Barriers to Discharge: Barriers Resolved   Patient Goals and CMS Choice     Choice offered to / list presented to : Patient, Spouse      Discharge Placement                Patient to be transferred to facility by: Wife Name of family member notified: Ronald [name] Patient and family notified of of transfer: 11/08/23  Discharge Plan and Services Additional resources added to the After Visit Summary for       Post Acute Care Choice: NA                               Social Drivers of Health (SDOH) Interventions SDOH Screenings   Food Insecurity: No Food Insecurity (11/07/2023)  Housing: Low Risk  (11/07/2023)  Transportation Needs: No Transportation Needs (11/07/2023)  Utilities: Not At Risk (11/07/2023)  Alcohol Screen: Low Risk  (06/22/2023)  Depression (PHQ2-9): Medium Risk (10/07/2023)  Financial Resource Strain: Low Risk  (06/23/2023)  Physical Activity: Inactive (06/23/2023)  Social Connections: Moderately Isolated (11/07/2023)  Stress: No Stress Concern Present (06/23/2023)  Tobacco Use: Medium Risk (11/06/2023)  Health Literacy: Adequate Health Literacy (06/23/2023)     Readmission Risk Interventions    11/07/2023   10:12 AM  Readmission Risk Prevention Plan  Transportation Screening Complete  Medication Review (RN Care Manager) Complete  PCP  or Specialist appointment within 3-5 days of discharge Complete  SW Recovery Care/Counseling Consult Complete  Palliative Care Screening Not Applicable  Skilled Nursing Facility Not Applicable

## 2023-11-08 NOTE — Progress Notes (Signed)
 [facility] [id number] [employer] [facility]) Hospital Liaison Note  Notified by Derril Flint of patient/family request for [facility] Palliative services at home after discharge.   [facility] liaison will follow patient for discharge disposition.   Please call with any Hospice/Palliative related questions or concerns.   Thank you for the opportunity to participate in this patient's care  Dwane Gitelman RN, [facility] Liaison [phone]

## 2023-11-08 NOTE — Progress Notes (Signed)
 AVS reviewed and given to patient. PIV removed. All questions answered.

## 2023-11-08 NOTE — Discharge Summary (Signed)
 Physician Discharge Summary   Patient: Ronald [name] [facility] MRN: [id number] DOB: 1946-02-27  Admit date:     11/06/2023  Discharge date: 11/08/23  Discharge Physician: Brenna Cam   PCP: Sheron Dixons, MD   Recommendations at discharge:    F/up with outpt providers as requested  Discharge Diagnoses: Principal Problem:   Tachyarrhythmia Active Problems:   Hyperkalemia   Abnormal EKG  Hospital Course: Assessment and Plan:  78 y.o. male with medical history significant of coronary artery disease status post CABG x 2, ischemic cardiomyopathy with left ventricular ejection fraction of 20 to 25%, persistent atrial fibrillation, type 2 diabetes mellitus, end-stage renal disease on hemodialysis, GERD, COPD admitted for feeling lightheaded and was hypotensive. Per wife, EMS was called and patient was apparently shocked into rhythm. He was brought to the emergency room to be further evaluated. EKG again reveals findings of wide-complex tachyarrhythmia. Rate of 115    6/9: cardio, nephro & Palliative care c/s   #Near syncope: due to v tach #Persistent atrial fibrillation # Ventricular Tachycardia  Cardio seen and Discussed that despite medical therapy it will likely recur. Not a candidate for defibrillator therapy. Palliative referral placed at discharge with consideration for Hospice (family leaning towards hospice) - Continue mexiletine 200mg  BID for suppression at DC - Continue increased dose of amiodarone  400mg  daily - End stage, agree with palliative consult, this will likely progress. Did discuss with wife and patient  today   #Hyperkalemia # ESRD -Patient was treated with hyperkalemia protocol in the ER. -s/p Urgent HD  - Usual schedule MWF.    #Acute on chronic HFrEF -Known ischemic cardiomyopathy, EF 20-25%.   -Unable to tolerate GDMT secondary to hypotension.      #CAD with history of CABG x 2. -He is asymptomatic at this time. -Troponin is 32.  Delta remains  flat. -Continue with statins and Aspirin  81 mg daily.         Consultants: Cardio,, Nephro Disposition: Home health Diet recommendation:  Discharge Diet Orders (From admission, onward)     Start     Ordered   11/08/23 0000  Diet - low sodium heart healthy        11/08/23 0937           Carb modified diet DISCHARGE MEDICATION: Allergies as of 11/08/2023       Reactions   Predicort [prednisolone] Other (See Comments)   Stomach pain   Ciprofloxacin Other (See Comments)   GI upset   Hydrochlorothiazide Other (See Comments)   Dehydration   Hydrocodone Nausea Only, Other (See Comments)   Stomach upset   Hydrocodone-acetaminophen  Nausea Only   Stomach upset   Sulfa Antibiotics Other (See Comments)   Cannot recall   Penicillins Hives, Rash, Other (See Comments)   Has patient had a PCN reaction causing immediate rash, facial/tongue/throat swelling, SOB or lightheadedness with hypotension: YES Has patient had a PCN reaction causing severe rash involving mucus membranes or skin necrosis: NO Has patient had a PCN reaction that required hospitalization NO Has patient had a PCN reaction occurring within the last 10 years:NO If all of the above answers are "NO", then may proceed with Cephalosporin use. Has patient had a PCN reaction causing immediate rash, facial/tongue/throat swelling, SOB or lightheadedness with hypotension: YES Has patient had a PCN reaction causing severe rash involving mucus membranes or skin necrosis: NO Has patient had a PCN reaction that required hospitalization NO Has patient had a PCN reaction occurring within the last 10 years:NO  If all of the above answers are "NO", then may proceed with Cephalosporin use.        Medication List     STOP taking these medications    acetaminophen  650 MG CR tablet Commonly known as: TYLENOL    cetirizine 10 MG chewable tablet Commonly known as: ZYRTEC   doxepin  25 MG capsule Commonly known as: SINEQUAN     gabapentin  100 MG capsule Commonly known as: NEURONTIN    metaxalone  400 MG tablet Commonly known as: SKELAXIN        TAKE these medications    allopurinol  100 MG tablet Commonly known as: ZYLOPRIM  TAKE 1 TABLET BY MOUTH EVERYDAY AT BEDTIME   amiodarone  200 MG tablet Commonly known as: PACERONE  Take 2 tablets (400 mg total) by mouth daily. What changed:  medication strength See the new instructions.   aspirin  EC 81 MG tablet Take 1 tablet (81 mg total) by mouth daily. Swallow whole.   atorvastatin  40 MG tablet Commonly known as: LIPITOR TAKE 1 TABLET BY MOUTH EVERYDAY AT BEDTIME   furosemide  20 MG tablet Commonly known as: LASIX  Take 60 mg by mouth daily as needed for edema.   levothyroxine  75 MCG tablet Commonly known as: SYNTHROID  TAKE 1 TABLET BY MOUTH EVERY DAY BEFORE BREAKFAST   mexiletine 200 MG capsule Commonly known as: MEXITIL Take 1 capsule (200 mg total) by mouth every 12 (twelve) hours.   midodrine  5 MG tablet Commonly known as: PROAMATINE  Take 1 tablet (5 mg total) by mouth 3 (three) times daily with meals. What changed: Another medication with the same name was removed. Continue taking this medication, and follow the directions you see here.   MULTIVITAMIN PO Take 1 tablet by mouth daily.   nitroGLYCERIN  0.4 MG SL tablet Commonly known as: NITROSTAT  Place 0.4 mg under the tongue every 5 (five) minutes as needed for chest pain (Up to 3 times).   pantoprazole  40 MG tablet Commonly known as: PROTONIX  Take 1 tablet (40 mg total) by mouth 2 (two) times daily. Home med.   senna 8.6 MG tablet Commonly known as: SENOKOT Take 1 tablet by mouth as needed for constipation.   Vitamin D -3 125 MCG (5000 UT) Tabs Take 2,000 Units by mouth daily.        Follow-up Information     Sheron Dixons, MD. Schedule an appointment as soon as possible for a visit in 1 week(s).   Specialty: Internal Medicine Why: [facility] Discharge F/UP Contact  information: [address] [place] [address] [phone]         Rica Chalet, MD. Schedule an appointment as soon as possible for a visit in 3 week(s).   Specialty: Nephrology Why: [facility] Discharge F/UP Contact information: [address] [place] [address] [phone]         Lauralee Poll, MD. Schedule an appointment as soon as possible for a visit in 2 week(s).   Specialty: Cardiology Why: [facility] Discharge F/UP Contact information: [address] [place] [address] [phone]                Discharge Exam: Filed Weights   11/06/23 1708 11/06/23 1951 11/06/23 2240  Weight: 97.4 kg 98.3 kg 97.6 kg   General exam: Appears calm and comfortable  Respiratory system: Clear to auscultation. Respiratory effort normal. Cardiovascular system: S1 & S2 heard, RRR. Midline incision suggesting prior CABG  Gastrointestinal system: Abdomen is soft, benign Central nervous system: Alert and oriented. No focal  neurological deficits. Extremities: Symmetric 5 x 5 power. Skin: No rashes, lesions or ulcers Psychiatry: Judgement and insight appear normal. Mood & affect appropriate.   Condition at discharge: fair  The results of significant diagnostics from this hospitalization (including imaging, microbiology, ancillary and laboratory) are listed below for reference.   Imaging Studies: CT ABDOMEN PELVIS WO CONTRAST Result Date: 11/06/2023 CLINICAL DATA:  Abdominal pain postop EXAM: CT ABDOMEN AND PELVIS WITHOUT CONTRAST TECHNIQUE: Multidetector CT imaging of the abdomen and pelvis was performed following the standard protocol without IV contrast. RADIATION DOSE REDUCTION: This exam was performed according to the departmental dose-optimization program which includes automated exposure control, adjustment of the mA and/or kV according to patient size and/or use of iterative reconstruction technique. COMPARISON:  09/13/2022  FINDINGS: Lower chest: Cardiomegaly, aortic atherosclerosis. Scarring at the left lung base. No effusions. Hepatobiliary: No focal liver abnormality is seen. Status post cholecystectomy. No biliary dilatation. Pancreas: No focal abnormality or ductal dilatation. Spleen: No focal abnormality.  Normal size. Adrenals/Urinary Tract: Adrenal glands normal. Atrophic kidneys with cortical thinning bilaterally. Bilateral renal cysts are stable. No follow-up imaging recommended. No hydronephrosis. Urinary bladder decompressed, grossly unremarkable. Stomach/Bowel: Stomach, large and small bowel grossly unremarkable. Vascular/Lymphatic: Heavily calcified aorta, iliac vessels and branch vessels. No evidence of aneurysm or adenopathy. Reproductive: No visible focal abnormality. Other: Small amount of free fluid in the abdomen and pelvis, possibly related to prior peritoneal dialysis catheter which has been recently removed. No free air. Musculoskeletal: No acute bony abnormality. IMPRESSION: Interval removal of previously seen peritoneal dialysis catheter. Small amount of residual free fluid likely related to prior PD catheter. No acute findings. Aortoiliac atherosclerosis. Electronically Signed   By: Janeece Mechanic M.D.   On: 11/06/2023 17:35   DG Chest Portable 1 View Result Date: 11/06/2023 CLINICAL DATA:  Shortness of breath EXAM: PORTABLE CHEST 1 VIEW COMPARISON:  08/05/2023 FINDINGS: Right dialysis catheter remains in place, unchanged. Prior CABG. Cardiomegaly, vascular congestion. Diffuse interstitial prominence could reflect interstitial edema. No effusions or acute bony abnormality. IMPRESSION: Cardiomegaly with vascular congestion and possible early interstitial edema. Electronically Signed   By: Janeece Mechanic M.D.   On: 11/06/2023 17:32    Microbiology: Results for orders placed or performed during the hospital encounter of 11/06/23  MRSA Next Gen by PCR, Nasal     Status: None   Collection Time: 11/07/23 10:47  AM   Specimen: Nasal Mucosa; Nasal Swab  Result Value Ref Range Status   MRSA by PCR Next Gen NOT DETECTED NOT DETECTED Final    Comment: (NOTE) The GeneXpert MRSA Assay (FDA approved for NASAL specimens only), is one component of a comprehensive MRSA colonization surveillance program. It is not intended to diagnose MRSA infection nor to guide or monitor treatment for MRSA infections. Test performance is not FDA approved in patients less than 62 years old. Performed at [facility], [address] [place], [place] [address]     Labs: CBC: Recent Labs  Lab 11/06/23 1706 11/07/23 0603 11/08/23 0454  WBC 5.4 4.6 4.8  NEUTROABS 4.3  --   --   HGB 9.4* 8.8* 9.2*  HCT 29.2* 26.7* 28.2*  MCV 94.2 93.4 92.2  PLT 89* 81* 86*   Basic Metabolic Panel: Recent Labs  Lab 11/06/23 1706 11/06/23 1841 11/07/23 0024 11/07/23 0603 11/08/23 0454  NA 136  --   --  136 135  K 6.3*  --  4.0 4.9 4.8  CL 100  --   --  97*  95*  CO2 23  --   --  29 25  GLUCOSE 135* 195*  --  78 91  BUN 48*  --   --  27* 41*  CREATININE 6.46*  --   --  4.37* 5.86*  CALCIUM  9.2  --   --  8.8* 8.9  MG 2.8*  --   --   --   --    Liver Function Tests: Recent Labs  Lab 11/06/23 1706  AST 44*  ALT 28  ALKPHOS 167*  BILITOT 1.5*  PROT 6.9  ALBUMIN 3.4*   CBG: Recent Labs  Lab 11/06/23 1917  GLUCAP 119*    Discharge time spent: greater than 30 minutes.  Signed: Brenna Cam, MD [facility] 11/08/2023

## 2023-11-09 ENCOUNTER — Telehealth: Payer: Self-pay | Admitting: *Deleted

## 2023-11-09 DIAGNOSIS — N186 End stage renal disease: Secondary | ICD-10-CM | POA: Diagnosis not present

## 2023-11-09 DIAGNOSIS — Z992 Dependence on renal dialysis: Secondary | ICD-10-CM | POA: Diagnosis not present

## 2023-11-09 NOTE — Transitions of Care (Post Inpatient/ED Visit) (Signed)
   11/09/2023  Name: Ronald [name] [facility] MRN: [id number] DOB: 1945-08-12  Today's TOC FU Call Status: Today's TOC FU Call Status:: Successful TOC FU Call Completed TOC FU Call Complete Date: 11/09/23 Patient's Name and Date of Birth confirmed.  Transition Care Management Follow-up Telephone Call Date of Discharge: 11/08/23 Discharge Facility: [facility]) Type of Discharge: Inpatient Admission Primary Inpatient Discharge Diagnosis:: Tachyarrhythmia How have you been since you were released from the hospital?: Better (patient feels better today. Last night he had some chest pain and it resolved. Per wife they had given him a dose of Morphine  before he left) Any questions or concerns?: Yes Patient Questions/Concerns:: Patient wife wanted to know why they stopped the Tylenol  Patient Questions/Concerns Addressed: Other: (Patient wife will discuss with the PCP and see if it needs to be restarted)  Items Reviewed: Did you receive and understand the discharge instructions provided?: Yes Medications obtained,verified, and reconciled?: Yes (Medications Reviewed) Any new allergies since your discharge?: No Dietary orders reviewed?: No Do you have support at home?: Yes People in Home [RPT]: spouse Name of Support/Comfort Primary Source: Dina Francisco  Medications Reviewed Today: Medications Reviewed Today   Medications were not reviewed in this encounter     Home Care and Equipment/Supplies: Were Home Health Services Ordered?: Yes Name of Home Health Agency:: [facility] Has Agency set up a time to come to your home?: No EMR reviewed for Home Health Orders: Orders present/patient has not received call (refer to CM for follow-up) Any new equipment or medical supplies ordered?: NA  Functional Questionnaire: Do you need assistance with bathing/showering or dressing?: No Do you need assistance with meal preparation?: Yes Do you need assistance with eating?: No Do you have  difficulty maintaining continence: No Do you need assistance with getting out of bed/getting out of a chair/moving?: No Do you have difficulty managing or taking your medications?: No  Follow up appointments reviewed: PCP Follow-up appointment confirmed?: Yes MD Provider Line [phone] Given: Yes (wife wants to call and make appointment) Date of PCP follow-up appointment?: 11/15/23 Follow-up Provider: [facility] Follow-up appointment confirmed?: Yes Date of Specialist follow-up appointment?: 11/22/23 Follow-Up Specialty Provider:: Dr Alease Amend Do you need transportation to your follow-up appointment?: No Do you understand care options if your condition(s) worsen?: Yes-patient verbalized understanding  SDOH Interventions Today    Flowsheet Row Most Recent Value  SDOH Interventions   Food Insecurity Interventions Intervention Not Indicated  Housing Interventions Intervention Not Indicated  Transportation Interventions Intervention Not Indicated, Patient Resources (Friends/Family)  Utilities Interventions Intervention Not Indicated       Una Ganser BSN RN [facility] Health Care Management Coordinator [email or web address] Direct Dial: [phone]  Fax: [phone] Website: [email or web address]

## 2023-11-11 DIAGNOSIS — N186 End stage renal disease: Secondary | ICD-10-CM | POA: Diagnosis not present

## 2023-11-11 DIAGNOSIS — Z992 Dependence on renal dialysis: Secondary | ICD-10-CM | POA: Diagnosis not present

## 2023-11-12 ENCOUNTER — Telehealth: Payer: Self-pay | Admitting: Physician Assistant

## 2023-11-12 NOTE — Telephone Encounter (Signed)
 Pt recently admitted to [facility] with VT. He was started on Mexiletine in addition to Amiodarone . His wife (DPR on file) called the answering service. The pt develops chest pain 20 min after each dose of Mexiletine. He has to take NTG. He has not been taking it twice daily as prescribed. He has only been taking it once daily. No palpitations or syncope. PLAN:  - Hold Mexiletine dose tonight. - Continue Amiodarone  - I will review with Dr. Alease Amend who saw him in the hospital, EP here tomorrow. Marlyse Single, PA-C    11/12/2023 5:18 PM

## 2023-11-13 NOTE — Telephone Encounter (Signed)
 Agree with holding, thanks.

## 2023-11-14 DIAGNOSIS — N186 End stage renal disease: Secondary | ICD-10-CM | POA: Diagnosis not present

## 2023-11-14 DIAGNOSIS — Z992 Dependence on renal dialysis: Secondary | ICD-10-CM | POA: Diagnosis not present

## 2023-11-15 ENCOUNTER — Ambulatory Visit: Admitting: Internal Medicine

## 2023-11-15 ENCOUNTER — Encounter: Payer: Self-pay | Admitting: Internal Medicine

## 2023-11-15 ENCOUNTER — Ambulatory Visit: Admitting: Physical Therapy

## 2023-11-15 VITALS — BP 124/76 | HR 78 | Ht 69.0 in | Wt 205.8 lb

## 2023-11-15 DIAGNOSIS — R0609 Other forms of dyspnea: Secondary | ICD-10-CM

## 2023-11-15 DIAGNOSIS — I420 Dilated cardiomyopathy: Secondary | ICD-10-CM | POA: Diagnosis not present

## 2023-11-15 DIAGNOSIS — I480 Paroxysmal atrial fibrillation: Secondary | ICD-10-CM

## 2023-11-15 DIAGNOSIS — L2989 Other pruritus: Secondary | ICD-10-CM

## 2023-11-15 DIAGNOSIS — M48061 Spinal stenosis, lumbar region without neurogenic claudication: Secondary | ICD-10-CM

## 2023-11-15 MED ORDER — DOXEPIN HCL 10 MG PO CAPS: 10.0000 mg | ORAL_CAPSULE | Freq: Every day | ORAL | 1 refills | Status: DC

## 2023-11-15 NOTE — Assessment & Plan Note (Signed)
 Rate fairly well controlled on amiodarone .

## 2023-11-15 NOTE — Progress Notes (Signed)
 Date:  11/15/2023   Name:  Olvin Rohr [facility]   DOB:  1945/11/09   MRN:  [id number]   Chief Complaint: Hospitalization Follow-up Hospital follow up.  Admitted to [facility] on 11/06/23 to  11/08/23.  TOC call done on 11/09/23. Since being home he is doing well.  No more dizziness since stopping Mexiletine. He is still going to HD and having no issues.  He is weak and walks with a walker.  Would like to get out more to car shows but gets too winded.  Palliative care is coming out to the house this Friday - we discussed what they might offer him.  Discharge Diagnoses: Principal Problem:   Tachyarrhythmia Active Problems:   Hyperkalemia   Abnormal EKG   Hospital Course: Assessment and Plan:   78 y.o. male with medical history significant of coronary artery disease status post CABG x 2, ischemic cardiomyopathy with left ventricular ejection fraction of 20 to 25%, persistent atrial fibrillation, type 2 diabetes mellitus, end-stage renal disease on hemodialysis, GERD, COPD admitted for feeling lightheaded and was hypotensive. Per wife, EMS was called and patient was apparently shocked into rhythm. He was brought to the emergency room to be further evaluated. EKG again reveals findings of wide-complex tachyarrhythmia. Rate of 115    6/9: cardio, nephro & Palliative care c/s   #Near syncope: due to v tach  >>>not a candidate for defibrillator therapy and suspected to recur despite medical therapy.  Palliative care referral made.  Considering Hospice. mexiletine 200mg  BID discontinued several days ago due to chest discomfort after dosing.  - Continue increased dose of amiodarone  400mg  daily  #Persistent atrial fibrillation >>> rate controlled today.  He still has episodes of elevated HR with exertion.  # Ventricular Tachycardia   Cardio seen and Discussed that despite medical therapy it will likely recur. Not a candidate for defibrillator therapy. Palliative referral placed at discharge with  consideration for Hospice (family leaning towards hospice) - Continue mexiletine 200mg  BID for suppression at DC - Continue increased dose of amiodarone  400mg  daily - End stage, agree with palliative consult, this will likely progress. Did discuss with wife and patient  today   #Hyperkalemia # ESRD -Patient was treated with hyperkalemia protocol in the ER. -s/p Urgent HD  - Usual schedule MWF.    #Acute on chronic HFrEF -Known ischemic cardiomyopathy, EF 20-25%.   -Unable to tolerate GDMT secondary to hypotension.      #CAD with history of CABG x 2. -He is asymptomatic at this time. -Troponin is 32.  Delta remains flat. -Continue with statins and Aspirin  81 mg daily.  STOP taking these medications     acetaminophen  650 MG CR tablet Commonly known as: TYLENOL   >>> patient may resume Tylenol  every 8 hours as needed for back pain   cetirizine 10 MG chewable tablet Commonly known as: ZYRTEC  >>> patient may resume daily as needed for allergy symptoms   doxepin  25 MG capsule Commonly known as: SINEQUAN   >>> patient had good relief of chronic itching and would like to resume.   gabapentin  100 MG capsule Commonly known as: NEURONTIN     metaxalone  400 MG tablet Commonly known as: SKELAXIN            TAKE these medications     allopurinol  100 MG tablet Commonly known as: ZYLOPRIM  TAKE 1 TABLET BY MOUTH EVERYDAY AT BEDTIME    amiodarone  200 MG tablet Commonly known as: PACERONE  Take 2 tablets (400 mg total) by mouth  daily. What changed:  medication strength See the new instructions.    aspirin  EC 81 MG tablet Take 1 tablet (81 mg total) by mouth daily. Swallow whole.    atorvastatin  40 MG tablet Commonly known as: LIPITOR TAKE 1 TABLET BY MOUTH EVERYDAY AT BEDTIME    furosemide  20 MG tablet Commonly known as: LASIX  Take 60 mg by mouth daily as needed for edema.    levothyroxine  75 MCG tablet Commonly known as: SYNTHROID  TAKE 1 TABLET BY MOUTH EVERY DAY BEFORE  BREAKFAST    mexiletine 200 MG capsule Commonly known as: MEXITIL  Take 1 capsule (200 mg total) by mouth every 12 (twelve) hours.    midodrine  5 MG tablet Commonly known as: PROAMATINE  Take 1 tablet (5 mg total) by mouth 3 (three) times daily with meals. What changed: Another medication with the same name was removed. Continue taking this medication, and follow the directions you see here.    MULTIVITAMIN PO Take 1 tablet by mouth daily.    nitroGLYCERIN  0.4 MG SL tablet Commonly known as: NITROSTAT  Place 0.4 mg under the tongue every 5 (five) minutes as needed for chest pain (Up to 3 times).    pantoprazole  40 MG tablet Commonly known as: PROTONIX  Take 1 tablet (40 mg total) by mouth 2 (two) times daily. Home med.    senna 8.6 MG tablet Commonly known as: SENOKOT Take 1 tablet by mouth as needed for constipation.    Vitamin D -3 125 MCG (5000 UT) Tabs Take 2,000 Units by mouth daily.     HPI  Review of Systems  Constitutional:  Positive for fatigue. Negative for chills and fever.  Respiratory:  Positive for chest tightness and shortness of breath. Negative for wheezing.   Cardiovascular:  Positive for palpitations and leg swelling. Negative for chest pain.  Musculoskeletal:  Positive for back pain and gait problem.  Skin:        Chronic itching  Neurological:  Positive for weakness. Negative for dizziness, light-headedness and headaches.  Psychiatric/Behavioral:  Positive for sleep disturbance. Negative for dysphoric mood. The patient is not nervous/anxious.      Lab Results  Component Value Date   NA 135 11/08/2023   K 4.8 11/08/2023   CO2 25 11/08/2023   GLUCOSE 91 11/08/2023   BUN 41 (H) 11/08/2023   CREATININE 5.86 (H) 11/08/2023   CALCIUM  8.9 11/08/2023   EGFR 14 (L) 05/19/2022   GFRNONAA 9 (L) 11/08/2023   Lab Results  Component Value Date   CHOL 127 07/19/2023   HDL 49 07/19/2023   LDLCALC 65 07/19/2023   TRIG 59 07/19/2023   CHOLHDL 2.6 07/19/2023    Lab Results  Component Value Date   TSH 4.523 (H) 11/07/2023   Lab Results  Component Value Date   HGBA1C 4.7 (L) 08/06/2023   Lab Results  Component Value Date   WBC 4.8 11/08/2023   HGB 9.2 (L) 11/08/2023   HCT 28.2 (L) 11/08/2023   MCV 92.2 11/08/2023   PLT 86 (L) 11/08/2023   Lab Results  Component Value Date   ALT 28 11/06/2023   AST 44 (H) 11/06/2023   ALKPHOS 167 (H) 11/06/2023   BILITOT 1.5 (H) 11/06/2023   Lab Results  Component Value Date   VD25OH 83 09/06/2022     Patient Active Problem List   Diagnosis Date Noted   Abnormal EKG 11/08/2023   Tachyarrhythmia 11/06/2023   Dyslipidemia 09/30/2023   ESRD (end stage renal disease) (HCC) 09/30/2023   Non-sustained ventricular  tachycardia (HCC) 09/29/2023   Hypothyroidism 09/29/2023   Gout 09/29/2023   Tremor of both hands 09/05/2023   Pruritus due to systemic disorder 09/05/2023   Hypotension 08/07/2023   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   Near syncope 08/05/2023   Acute blood loss anemia (ABLA) 08/05/2023   HFrEF (heart failure with reduced ejection fraction) (HCC) 07/19/2023   Muscle tension headache 11/15/2022   Acquired thrombophilia (HCC) 11/10/2022   ESRD (end stage renal disease) on dialysis (HCC) 09/16/2022   Physical deconditioning 09/16/2022   Bilateral leg edema 09/13/2022   Coronary artery disease of native artery of native heart with stable angina pectoris (HCC) 07/22/2022   OSA (obstructive sleep apnea) 07/22/2022   Chronic systolic CHF (congestive heart failure) (HCC) 07/15/2022   Typical atrial flutter (HCC) 07/15/2022   Acquired hypothyroidism 05/28/2022   GERD without esophagitis 05/28/2022   Essential hypertension 05/05/2022   Prediabetes 04/06/2022   Spinal stenosis of lumbar region without neurogenic claudication 04/06/2022   Weakness of both lower extremities 04/06/2022   Upper airway cough syndrome 10/01/2021   Hyperkalemia 01/16/2020   Bursitis of hip  12/18/2018   Paroxysmal atrial fibrillation (HCC) 06/10/2018   Atherosclerosis of native arteries of extremity with intermittent claudication (HCC) 06/25/2016   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   Coronary artery disease involving native coronary artery with angina pectoris (HCC)    Allergic rhinitis 08/19/2015   Anemia 10/04/2014   AA (aortic aneurysm) (HCC) 10/04/2014   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   Basal cell papilloma 10/04/2014   Unspecified osteoarthritis, unspecified site 10/04/2014   Thoracic aortic aneurysm (TAA) (HCC) 10/04/2014   Atherosclerotic heart disease of native coronary artery without angina pectoris 10/04/2014   Benign prostatic hyperplasia with lower urinary tract symptoms 10/24/2012   DOE (dyspnea on exertion) 11/12/2009   Renal artery stenosis     Mixed hyperlipidemia 09/04/2008   History of redo bypass grafting 09/04/2008    Allergies  Allergen Reactions   Predicort [Prednisolone] Other (See Comments)    Stomach pain   Ciprofloxacin Other (See Comments)    GI upset   Hydrochlorothiazide Other (See Comments)    Dehydration   Hydrocodone Nausea Only and Other (See Comments)    Stomach upset    Hydrocodone-Acetaminophen  Nausea Only    Stomach upset   Sulfa Antibiotics Other (See Comments)    Cannot recall   Penicillins Hives, Rash and Other (See Comments)    Has patient had a PCN reaction causing immediate rash, facial/tongue/throat swelling, SOB or lightheadedness with hypotension: YES  Has patient had a PCN reaction causing severe rash involving mucus membranes or skin necrosis: NO  Has patient had a PCN reaction that required hospitalization NO  Has patient had a PCN reaction occurring within the last 10 years:NO  If all of the above answers are NO, then may proceed with Cephalosporin use.  Has patient had a PCN reaction causing immediate rash, facial/tongue/throat swelling, SOB or lightheadedness with hypotension: YES Has patient had a  PCN reaction causing severe rash involving mucus membranes or skin necrosis: NO Has patient had a PCN reaction that required hospitalization NO Has patient had a PCN reaction occurring within the last 10 years:NO If all of the above answers are NO, then may proceed with Cephalosporin use.    Past Surgical History:  Procedure Laterality Date   CARDIAC CATHETERIZATION  several   CARDIAC CATHETERIZATION N/A 12/13/2014   Procedure: Left Heart Cath and Coronary Angiography;  Surgeon: Lucendia Rusk, MD;  Location: [facility] INVASIVE CV  LAB;  Service: Cardiovascular;  Laterality: N/A;   CARDIAC CATHETERIZATION  12/13/2014   Procedure: Coronary Stent Intervention;  Surgeon: Lucendia Rusk, MD;  Location: [facility] INVASIVE CV LAB;  Service: Cardiovascular;;   CARDIAC CATHETERIZATION N/A 10/07/2015   Procedure: Left Heart Cath and Cors/Grafts Angiography;  Surgeon: Odie Benne, MD;  Location: [facility] INVASIVE CV LAB;  Service: Cardiovascular;  Laterality: N/A;   CARDIAC CATHETERIZATION N/A 10/07/2015   Procedure: Coronary Stent Intervention;  Surgeon: Odie Benne, MD;  Location: [facility] INVASIVE CV LAB;  Service: Cardiovascular;  Laterality: N/A;   CARDIOVERSION N/A 05/25/2022   Procedure: CARDIOVERSION;  Surgeon: Jerryl Morin, DO;  Location: [facility] ENDOSCOPY;  Service: Cardiovascular;  Laterality: N/A;   CATARACT EXTRACTION W/PHACO Left 10/21/2021   Procedure: CATARACT EXTRACTION PHACO AND INTRAOCULAR LENS PLACEMENT (IOC) LEFT 3.94 00:33.7;  Surgeon: Rosa College, MD;  Location: [facility];  Service: Ophthalmology;  Laterality: Left;   CATARACT EXTRACTION W/PHACO Right 11/09/2021   Procedure: CATARACT EXTRACTION PHACO AND INTRAOCULAR LENS PLACEMENT (IOC) RIGHT;  Surgeon: Rosa College, MD;  Location: [facility];  Service: Ophthalmology;  Laterality: Right;  3.59 0:29.4   CORONARY ANGIOPLASTY  several   CORONARY ANGIOPLASTY WITH STENT PLACEMENT  2005; 12/13/2014    2; 1   CORONARY ARTERY BYPASS GRAFT  05/31/1994   CABG X5   CORONARY ARTERY BYPASS GRAFT  07/29/2005   CABG X3   DIALYSIS/PERMA CATHETER INSERTION N/A 09/10/2022   Procedure: DIALYSIS/PERMA CATHETER INSERTION;  Surgeon: Jackquelyn Mass, MD;  Location: [facility] INVASIVE CV LAB;  Service: Cardiovascular;  Laterality: N/A;   ESOPHAGOGASTRODUODENOSCOPY (EGD) WITH ESOPHAGEAL DILATION  05/31/1998   GREEN LIGHT LASER TURP (TRANSURETHRAL RESECTION OF PROSTATE  01/30/1999   not cancerous   HERNIA REPAIR     LAPAROSCOPIC CHOLECYSTECTOMY     LEFT HEART CATH AND CORS/GRAFTS ANGIOGRAPHY N/A 07/28/2017   Procedure: LEFT HEART CATH AND CORS/GRAFTS ANGIOGRAPHY;  Surgeon: Millicent Ally, MD;  Location: [facility] INVASIVE CV LAB;  Service: Cardiovascular;  Laterality: N/A;   LUNG SURGERY  05/31/1994   S/P CABG, had to put staple in lung after it had collapsed   MINOR REMOVAL OF PERITONEAL DIALYSIS CATHETER  10/2022   UMBILICAL HERNIA REPAIR     w/chole    Social History   Tobacco Use   Smoking status: Former    Current packs/day: 0.00    Average packs/day: 3.0 packs/day for 20.0 years (60.0 ttl pk-yrs)    Types: Cigarettes    Start date: 07/25/1966    Quit date: 07/25/1986    Years since quitting: 37.3   Smokeless tobacco: Never  Vaping Use   Vaping status: Never Used  Substance Use Topics   Alcohol use: No    Alcohol/week: 0.0 standard drinks of alcohol   Drug use: No     Medication list has been reviewed and updated.  Current Meds  Medication Sig   allopurinol  (ZYLOPRIM ) 100 MG tablet TAKE 1 TABLET BY MOUTH EVERYDAY AT BEDTIME   amiodarone  (PACERONE ) 200 MG tablet Take 2 tablets (400 mg total) by mouth daily.   aspirin  EC 81 MG tablet Take 1 tablet (81 mg total) by mouth daily. Swallow whole.   atorvastatin  (LIPITOR) 40 MG tablet TAKE 1 TABLET BY MOUTH EVERYDAY AT BEDTIME   Cholecalciferol  (VITAMIN D -3) 125 MCG (5000 UT) TABS Take 2,000 Units by mouth daily.    doxepin  (SINEQUAN ) 10 MG  capsule Take 1 capsule (10 mg total) by mouth at bedtime.  furosemide  (LASIX ) 20 MG tablet Take 60 mg by mouth daily as needed for edema.   levothyroxine  (SYNTHROID ) 75 MCG tablet TAKE 1 TABLET BY MOUTH EVERY DAY BEFORE BREAKFAST   midodrine  (PROAMATINE ) 5 MG tablet Take 1 tablet (5 mg total) by mouth 3 (three) times daily with meals.   Multiple Vitamin (MULTIVITAMIN PO) Take 1 tablet by mouth daily.   nitroGLYCERIN  (NITROSTAT ) 0.4 MG SL tablet Place 0.4 mg under the tongue every 5 (five) minutes as needed for chest pain (Up to 3 times).   pantoprazole  (PROTONIX ) 40 MG tablet Take 1 tablet (40 mg total) by mouth 2 (two) times daily. Home med.   senna (SENOKOT) 8.6 MG tablet Take 1 tablet by mouth as needed for constipation.       10/07/2023    2:07 PM 08/15/2023    1:44 PM 07/26/2023    1:58 PM 07/19/2023    9:37 AM  GAD 7 : Generalized Anxiety Score  Nervous, Anxious, on Edge 3 2 0 0  Control/stop worrying 1 0 0 1  Worry too much - different things 1 0 0 1  Trouble relaxing 1 0 0 1  Restless 1 2 0 1  Easily annoyed or irritable 1 2 0 0  Afraid - awful might happen 1 2 0 0  Total GAD 7 Score 9 8 0 4  Anxiety Difficulty Somewhat difficult  Not difficult at all Not difficult at all       10/07/2023    2:07 PM 08/15/2023    1:44 PM 07/26/2023    1:58 PM  Depression screen PHQ 2/9  Decreased Interest 3 0 0  Down, Depressed, Hopeless 0 0 0  PHQ - 2 Score 3 0 0  Altered sleeping 1 0 0  Tired, decreased energy 3 3 0  Change in appetite 0 0 0  Feeling bad or failure about yourself  0 0 0  Trouble concentrating 0 0 0  Moving slowly or fidgety/restless 3 3 0  Suicidal thoughts 0 0 0  PHQ-9 Score 10 6 0  Difficult doing work/chores Extremely dIfficult Somewhat difficult Not difficult at all    BP Readings from Last 3 Encounters:  11/15/23 124/76  11/08/23 (!) 108/57  10/07/23 110/60    Physical Exam Constitutional:      Appearance: He is obese.   Cardiovascular:     Rate and  Rhythm: Normal rate. Rhythm irregular.  Pulmonary:     Effort: Pulmonary effort is normal.     Breath sounds: Decreased breath sounds present. No wheezing or rhonchi.  Abdominal:     General: Abdomen is protuberant. Bowel sounds are normal.     Palpations: Abdomen is soft.     Tenderness: There is no abdominal tenderness.   Musculoskeletal:     Cervical back: Normal range of motion.     Comments: In wheelchair- gait not tested  Lymphadenopathy:     Cervical: No cervical adenopathy.   Skin:    General: Skin is warm and dry.     Findings: Ecchymosis present.   Neurological:     Mental Status: He is alert.   Psychiatric:        Attention and Perception: Attention normal.        Mood and Affect: Mood normal.        Speech: Speech normal.     Wt Readings from Last 3 Encounters:  11/15/23 205 lb 12.8 oz (93.4 kg)  11/06/23 215 lb 2.7 oz (97.6 kg)  10/07/23 207 lb 4 oz (94 kg)    BP 124/76   Pulse 78   Ht 5' 9 (1.753 m)   Wt 205 lb 12.8 oz (93.4 kg)   SpO2 97%   BMI 30.39 kg/m   Assessment and Plan:  Problem List Items Addressed This Visit       Unprioritized   DOE (dyspnea on exertion) (Chronic)   Due to CM - will Rx rolator to help with activities outside the home.      Paroxysmal atrial fibrillation (HCC) (Chronic)   Rate fairly well controlled on amiodarone .      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       Pruritus due to systemic disorder - Primary (Chronic)   Relevant Medications   doxepin  (SINEQUAN ) 10 MG capsule   Dilated cardiomyopathy (HCC)    No follow-ups on file.    Sheron Dixons, MD [facility] [place]

## 2023-11-15 NOTE — Assessment & Plan Note (Signed)
 He may resume tylenol  q 6-8 hours as needed.

## 2023-11-15 NOTE — Assessment & Plan Note (Signed)
 Due to CM - will Rx rolator to help with activities outside the home.

## 2023-11-16 DIAGNOSIS — N186 End stage renal disease: Secondary | ICD-10-CM | POA: Diagnosis not present

## 2023-11-16 DIAGNOSIS — Z992 Dependence on renal dialysis: Secondary | ICD-10-CM | POA: Diagnosis not present

## 2023-11-17 ENCOUNTER — Encounter: Admitting: Physical Therapy

## 2023-11-18 DIAGNOSIS — Z992 Dependence on renal dialysis: Secondary | ICD-10-CM | POA: Diagnosis not present

## 2023-11-18 DIAGNOSIS — N186 End stage renal disease: Secondary | ICD-10-CM | POA: Diagnosis not present

## 2023-11-21 ENCOUNTER — Telehealth: Payer: Self-pay | Admitting: Cardiology

## 2023-11-21 DIAGNOSIS — N186 End stage renal disease: Secondary | ICD-10-CM | POA: Diagnosis not present

## 2023-11-21 DIAGNOSIS — Z992 Dependence on renal dialysis: Secondary | ICD-10-CM | POA: Diagnosis not present

## 2023-11-21 NOTE — Telephone Encounter (Signed)
 Called to confirm/remind patient of their appointment at the [facility] on 11/22/23.   Appointment:   [x] Confirmed  [] Left mess   [] No answer/No voice mail  [] VM Full/unable to leave message  [] Phone not in service  Patient reminded to bring all medications and/or complete list.  Confirmed patient has transportation. Gave directions, instructed to utilize valet parking.

## 2023-11-22 ENCOUNTER — Ambulatory Visit: Attending: Cardiology | Admitting: Cardiology

## 2023-11-22 ENCOUNTER — Encounter: Payer: Self-pay | Admitting: Cardiology

## 2023-11-22 DIAGNOSIS — N186 End stage renal disease: Secondary | ICD-10-CM | POA: Diagnosis not present

## 2023-11-22 DIAGNOSIS — E785 Hyperlipidemia, unspecified: Secondary | ICD-10-CM | POA: Diagnosis not present

## 2023-11-22 DIAGNOSIS — M109 Gout, unspecified: Secondary | ICD-10-CM | POA: Diagnosis not present

## 2023-11-22 DIAGNOSIS — J449 Chronic obstructive pulmonary disease, unspecified: Secondary | ICD-10-CM | POA: Insufficient documentation

## 2023-11-22 DIAGNOSIS — I2510 Atherosclerotic heart disease of native coronary artery without angina pectoris: Secondary | ICD-10-CM | POA: Insufficient documentation

## 2023-11-22 DIAGNOSIS — Z87891 Personal history of nicotine dependence: Secondary | ICD-10-CM | POA: Diagnosis not present

## 2023-11-22 DIAGNOSIS — Z951 Presence of aortocoronary bypass graft: Secondary | ICD-10-CM | POA: Insufficient documentation

## 2023-11-22 DIAGNOSIS — E1122 Type 2 diabetes mellitus with diabetic chronic kidney disease: Secondary | ICD-10-CM | POA: Diagnosis not present

## 2023-11-22 DIAGNOSIS — K219 Gastro-esophageal reflux disease without esophagitis: Secondary | ICD-10-CM | POA: Insufficient documentation

## 2023-11-22 DIAGNOSIS — L2989 Other pruritus: Secondary | ICD-10-CM | POA: Diagnosis not present

## 2023-11-22 DIAGNOSIS — I480 Paroxysmal atrial fibrillation: Secondary | ICD-10-CM | POA: Diagnosis not present

## 2023-11-22 DIAGNOSIS — Z992 Dependence on renal dialysis: Secondary | ICD-10-CM | POA: Diagnosis not present

## 2023-11-22 DIAGNOSIS — I714 Abdominal aortic aneurysm, without rupture, unspecified: Secondary | ICD-10-CM | POA: Insufficient documentation

## 2023-11-22 DIAGNOSIS — I132 Hypertensive heart and chronic kidney disease with heart failure and with stage 5 chronic kidney disease, or end stage renal disease: Secondary | ICD-10-CM | POA: Diagnosis not present

## 2023-11-22 DIAGNOSIS — I5022 Chronic systolic (congestive) heart failure: Secondary | ICD-10-CM | POA: Insufficient documentation

## 2023-11-22 DIAGNOSIS — I130 Hypertensive heart and chronic kidney disease with heart failure and stage 1 through stage 4 chronic kidney disease, or unspecified chronic kidney disease: Secondary | ICD-10-CM | POA: Diagnosis present

## 2023-11-22 NOTE — Patient Instructions (Signed)
 Follow-Up as needed if you have any future concerns or if anything worsens.  At the [facility], you and your health needs are our priority. We have a designated team specialized in the treatment of Heart Failure. This Care Team includes your primary Heart Failure Specialized Cardiologist (physician), Advanced Practice Providers (APPs- Physician Assistants and Nurse Practitioners), and Pharmacist who all work together to provide you with the care you need, when you need it.   You may see any of the following providers on your designated Care Team at your next follow up:  Dr. Toribio Fuel Dr. Ezra Shuck Dr. Ria Commander Dr. Odis Brownie Ellouise Class, FNP Jaun Bash, RPH-CPP  Please be sure to bring in all your medications bottles to every appointment.   Need to Contact Us :  If you have any questions or concerns before your next appointment please send us  a message through [place] or call our office at [phone].    TO LEAVE A MESSAGE FOR THE NURSE SELECT OPTION 2, PLEASE LEAVE A MESSAGE INCLUDING: YOUR NAME DATE OF BIRTH CALL BACK NUMBER REASON FOR CALL**this is important as we prioritize the call backs  YOU WILL RECEIVE A CALL BACK THE SAME DAY AS LONG AS YOU CALL BEFORE 4:00 PM +

## 2023-11-22 NOTE — Progress Notes (Signed)
 ADVANCED HEART FAILURE FOLLOW UP CLINIC NOTE  Referring Physician: [name], MD  Primary Care: Ronald Leita DEL, MD Primary Cardiologist:  HPI: Ronald [name] is a 78 y.o. male with a PMH of  AAA, CAD, DM, hyperlipidemia, HTN, CKD, COPD, GERD, gout, pneumonia, atrial fibrillation, previous tobacco use and chronic heart failure who presents for follow up of chronic systolic heart failure.          Left heart catheterization 07/28/2017 showed significant native CAD with total occlusion of LAD after the takeoff of first diagonal vessel with total occlusion of proximal left circumflex coronary artery and total occlusion of proximal RCA with antegrade bridging collaterals.  Patent LIMA to LAD.  Patent Y graft from surgery which supplies diagonal and distal circumflex marginal vessels.    Was in the ED 11/10/2022 after a fall getting off his lawnmower.  CT scan negative for acute traumatic injury.  Was in the ED at 11/08/2022 due to headache on the right side.  Hit his head roughly a week prior on the left side.  CT scan reassuring with no acute abnormalities or skull fracture.  Patient had labs which were reassuring.  Admitted 07/15/2022 due to midsternal chest pain.  Heparin  drip and medical management.  Admitted 05/28/2022 due to history of heart failure.  Diuresis with Lasix  gtt.  Cardiology and nephrology consults obtained.    Continues to have leg swelling but markedly improved since starting dialysis M, W, F.  He was trialed on peritoneal dialysis but unfortunately his catheter did not function well.  He also has a left upper arm fistula in place that has not started working yet either.      SUBJECTIVE:  Not able to tolerate mexilitine, gave him chest pain. No further rhythm issues reported, seems to be tolerating dialysis fair with midodrine . He currently is not unhappy with his quality of life and would like to continue dialysis while he can. We discussed his multiple comorbid  conditions and he would like to remain DNR/DNI but would continue current medical interventions. Palliative care is following up with patient. He is overall stable from a HF perspective and will seen prn from now on.    PMH, current medications, allergies, social history, and family history reviewed in epic.  PHYSICAL EXAM: Vitals:   11/22/23 1405  BP: 120/64  Pulse: 98  SpO2: 97%    GENERAL: chronically ill appearing PULM: normal work of breathing CARDIAC:  JVP: difficult to assess         regular rate and rhythm, systolic murmur ABDOMEN: Soft, non-tender, non-distended. NEUROLOGIC: Patient is oriented x3 with no focal or lateralizing neurologic deficits.    DATA REVIEW  ECG: 10/2022: Sinus tachycardia, right axis deviation, pulmonary disease pattern.   ECHO: 12/23: LVEF 35 to 40%, global hypokinesis, mildly dilated LV, RV systolic function mildly reduced.   03/2023: Severely reduced LVEF of 20 to 25%, global hypokinesis though with preserved inferior wall motion.  Moderate mitral regurgitation, moderate tricuspid regurgitation.   CATH: [facility] 07/2017: Negative circulation are occluded, patent LIMA to LAD Y graft to RCA circumflex with moderate disease.    Heart failure review: - Classification: Heart failure with reduced EF - Etiology: Ischemic - NYHA Class: IIIb - Volume status: Hypervolemic - ACEi/ARB/ARNI: Intolerant - Aldosterone antagonist: Intolerant - Beta-blocker: Intolerant - Digoxin: Intolerant - Hydralazine /Nitrates: Intolerant - SGLT2i: Intolerant - GLP-1: Not a candidate - Advanced therapies: Not a candidate - ICD: Not a candidate  ASSESSMENT & PLAN:  Heart failure with reduced EF: Likely secondary to known ischemia, unable to tolerate GDMT with his ERSD, worsening functional status. Palliative care following at this point. Nothing further to offer from an advanced heart failure standpoint, will see as needed. -Continue furosemide  60mg  on non HD days -  Continue midodrine  5mg  for BP support with HD -No ICD given iHD, expected mortalitiy - Would like to continue to receive dialysis  VT: Multiple recent admissions with suspected scar mediated VT, now on amiodarone . - Continue amiodarone  400mg  daily - If recurrent would consider hospice evaluation   HTN w/ CKD: BP controlled today -Therapy as above    DM- - A1c 09/06/22 was 5.8%   PAF: cardioverted 04/2022. Not on OAC given bleeding issues.  - Continue amiodarone  as above   ESRD:  - Continue iHD for now  No follow up needed  Morene Brownie, MD Advanced Heart Failure Mechanical Circulatory Support 11/26/23

## 2023-11-23 DIAGNOSIS — Z992 Dependence on renal dialysis: Secondary | ICD-10-CM | POA: Diagnosis not present

## 2023-11-23 DIAGNOSIS — N186 End stage renal disease: Secondary | ICD-10-CM | POA: Diagnosis not present

## 2023-11-24 ENCOUNTER — Encounter: Admitting: Physical Therapy

## 2023-11-25 DIAGNOSIS — N186 End stage renal disease: Secondary | ICD-10-CM | POA: Diagnosis not present

## 2023-11-25 DIAGNOSIS — Z992 Dependence on renal dialysis: Secondary | ICD-10-CM | POA: Diagnosis not present

## 2023-11-28 DIAGNOSIS — Z992 Dependence on renal dialysis: Secondary | ICD-10-CM | POA: Diagnosis not present

## 2023-11-28 DIAGNOSIS — N186 End stage renal disease: Secondary | ICD-10-CM | POA: Diagnosis not present

## 2023-11-29 ENCOUNTER — Encounter: Admitting: Physical Therapy

## 2023-11-30 DIAGNOSIS — N186 End stage renal disease: Secondary | ICD-10-CM | POA: Diagnosis not present

## 2023-11-30 DIAGNOSIS — Z992 Dependence on renal dialysis: Secondary | ICD-10-CM | POA: Diagnosis not present

## 2023-11-30 DIAGNOSIS — Z23 Encounter for immunization: Secondary | ICD-10-CM | POA: Diagnosis not present

## 2023-12-01 ENCOUNTER — Encounter: Admitting: Physical Therapy

## 2023-12-02 DIAGNOSIS — N186 End stage renal disease: Secondary | ICD-10-CM | POA: Diagnosis not present

## 2023-12-02 DIAGNOSIS — Z992 Dependence on renal dialysis: Secondary | ICD-10-CM | POA: Diagnosis not present

## 2023-12-02 DIAGNOSIS — Z23 Encounter for immunization: Secondary | ICD-10-CM | POA: Diagnosis not present

## 2023-12-05 DIAGNOSIS — Z23 Encounter for immunization: Secondary | ICD-10-CM | POA: Diagnosis not present

## 2023-12-05 DIAGNOSIS — Z992 Dependence on renal dialysis: Secondary | ICD-10-CM | POA: Diagnosis not present

## 2023-12-05 DIAGNOSIS — N186 End stage renal disease: Secondary | ICD-10-CM | POA: Diagnosis not present

## 2023-12-06 ENCOUNTER — Encounter: Admitting: Physical Therapy

## 2023-12-07 DIAGNOSIS — Z23 Encounter for immunization: Secondary | ICD-10-CM | POA: Diagnosis not present

## 2023-12-07 DIAGNOSIS — N186 End stage renal disease: Secondary | ICD-10-CM | POA: Diagnosis not present

## 2023-12-07 DIAGNOSIS — Z992 Dependence on renal dialysis: Secondary | ICD-10-CM | POA: Diagnosis not present

## 2023-12-08 ENCOUNTER — Encounter: Admitting: Physical Therapy

## 2023-12-09 DIAGNOSIS — Z992 Dependence on renal dialysis: Secondary | ICD-10-CM | POA: Diagnosis not present

## 2023-12-09 DIAGNOSIS — Z23 Encounter for immunization: Secondary | ICD-10-CM | POA: Diagnosis not present

## 2023-12-09 DIAGNOSIS — N186 End stage renal disease: Secondary | ICD-10-CM | POA: Diagnosis not present

## 2023-12-12 DIAGNOSIS — Z992 Dependence on renal dialysis: Secondary | ICD-10-CM | POA: Diagnosis not present

## 2023-12-12 DIAGNOSIS — N186 End stage renal disease: Secondary | ICD-10-CM | POA: Diagnosis not present

## 2023-12-12 DIAGNOSIS — Z23 Encounter for immunization: Secondary | ICD-10-CM | POA: Diagnosis not present

## 2023-12-13 ENCOUNTER — Encounter: Admitting: Physical Therapy

## 2023-12-14 DIAGNOSIS — Z23 Encounter for immunization: Secondary | ICD-10-CM | POA: Diagnosis not present

## 2023-12-14 DIAGNOSIS — Z992 Dependence on renal dialysis: Secondary | ICD-10-CM | POA: Diagnosis not present

## 2023-12-14 DIAGNOSIS — N186 End stage renal disease: Secondary | ICD-10-CM | POA: Diagnosis not present

## 2023-12-15 ENCOUNTER — Encounter: Admitting: Physical Therapy

## 2023-12-16 DIAGNOSIS — N186 End stage renal disease: Secondary | ICD-10-CM | POA: Diagnosis not present

## 2023-12-16 DIAGNOSIS — Z23 Encounter for immunization: Secondary | ICD-10-CM | POA: Diagnosis not present

## 2023-12-16 DIAGNOSIS — Z992 Dependence on renal dialysis: Secondary | ICD-10-CM | POA: Diagnosis not present

## 2023-12-19 DIAGNOSIS — Z992 Dependence on renal dialysis: Secondary | ICD-10-CM | POA: Diagnosis not present

## 2023-12-19 DIAGNOSIS — N186 End stage renal disease: Secondary | ICD-10-CM | POA: Diagnosis not present

## 2023-12-19 DIAGNOSIS — Z23 Encounter for immunization: Secondary | ICD-10-CM | POA: Diagnosis not present

## 2023-12-20 ENCOUNTER — Encounter: Admitting: Physical Therapy

## 2023-12-21 ENCOUNTER — Ambulatory Visit (INDEPENDENT_AMBULATORY_CARE_PROVIDER_SITE_OTHER): Admitting: Internal Medicine

## 2023-12-21 ENCOUNTER — Other Ambulatory Visit
Admission: RE | Admit: 2023-12-21 | Discharge: 2023-12-21 | Disposition: A | Discharge status: Home / Self Care | Source: Home / Self Care | Attending: Internal Medicine | Admitting: Internal Medicine

## 2023-12-21 ENCOUNTER — Ambulatory Visit
Admission: RE | Admit: 2023-12-21 | Discharge: 2023-12-21 | Disposition: A | Discharge status: Home / Self Care | Source: Ambulatory Visit | Attending: Internal Medicine | Admitting: Internal Medicine

## 2023-12-21 ENCOUNTER — Encounter: Payer: Self-pay | Admitting: Internal Medicine

## 2023-12-21 ENCOUNTER — Ambulatory Visit: Payer: Self-pay | Admitting: Internal Medicine

## 2023-12-21 ENCOUNTER — Ambulatory Visit
Admission: RE | Admit: 2023-12-21 | Discharge: 2023-12-21 | Disposition: A | Discharge status: Home / Self Care | Attending: Internal Medicine | Admitting: Internal Medicine

## 2023-12-21 ENCOUNTER — Ambulatory Visit: Admission: RE | Admit: 2023-12-21 | Source: Ambulatory Visit

## 2023-12-21 ENCOUNTER — Other Ambulatory Visit: Payer: Self-pay | Admitting: Internal Medicine

## 2023-12-21 VITALS — BP 128/74 | HR 93 | Temp 97.8°F | Ht 69.0 in | Wt 202.0 lb

## 2023-12-21 DIAGNOSIS — N186 End stage renal disease: Secondary | ICD-10-CM | POA: Diagnosis not present

## 2023-12-21 DIAGNOSIS — Z23 Encounter for immunization: Secondary | ICD-10-CM | POA: Diagnosis not present

## 2023-12-21 DIAGNOSIS — R1084 Generalized abdominal pain: Secondary | ICD-10-CM | POA: Diagnosis not present

## 2023-12-21 DIAGNOSIS — Z992 Dependence on renal dialysis: Secondary | ICD-10-CM | POA: Diagnosis not present

## 2023-12-21 DIAGNOSIS — R109 Unspecified abdominal pain: Secondary | ICD-10-CM | POA: Diagnosis not present

## 2023-12-21 DIAGNOSIS — I953 Hypotension of hemodialysis: Secondary | ICD-10-CM

## 2023-12-21 DIAGNOSIS — K760 Fatty (change of) liver, not elsewhere classified: Secondary | ICD-10-CM | POA: Diagnosis not present

## 2023-12-21 LAB — CBC WITH DIFFERENTIAL/PLATELET
Abs Immature Granulocytes: 0.01 K/uL (ref 0.00–0.07)
Basophils Absolute: 0.1 K/uL (ref 0.0–0.1)
Basophils Relative: 1 %
Eosinophils Absolute: 0.2 K/uL (ref 0.0–0.5)
Eosinophils Relative: 4 %
HCT: 30.6 % — ABNORMAL LOW (ref 39.0–52.0)
Hemoglobin: 10.2 g/dL — ABNORMAL LOW (ref 13.0–17.0)
Immature Granulocytes: 0 %
Lymphocytes Relative: 12 %
Lymphs Abs: 0.6 K/uL — ABNORMAL LOW (ref 0.7–4.0)
MCH: 29.9 pg (ref 26.0–34.0)
MCHC: 33.3 g/dL (ref 30.0–36.0)
MCV: 89.7 fL (ref 80.0–100.0)
Monocytes Absolute: 0.7 K/uL (ref 0.1–1.0)
Monocytes Relative: 14 %
Neutro Abs: 3.2 K/uL (ref 1.7–7.7)
Neutrophils Relative %: 69 %
Platelets: 115 K/uL — ABNORMAL LOW (ref 150–400)
RBC: 3.41 MIL/uL — ABNORMAL LOW (ref 4.22–5.81)
RDW: 16.7 % — ABNORMAL HIGH (ref 11.5–15.5)
WBC: 4.7 K/uL (ref 4.0–10.5)
nRBC: 0 % (ref 0.0–0.2)

## 2023-12-21 LAB — AMYLASE: Amylase: 80 U/L (ref 28–100)

## 2023-12-21 MED ORDER — MIDODRINE HCL 5 MG PO TABS: 5.0000 mg | ORAL_TABLET | Freq: Three times a day (TID) | ORAL | 5 refills | Status: DC

## 2023-12-21 MED ORDER — IOHEXOL 300 MG/ML  SOLN
100.0000 mL | Freq: Once | INTRAMUSCULAR | Status: AC | PRN
  Administered 2023-12-21: 100 mL via INTRAVENOUS

## 2023-12-21 NOTE — Progress Notes (Signed)
 Date:  12/21/2023   Name:  Ronald [name]   DOB:  [birth year]   MRN:  [id number]   Chief Complaint: Abdominal Pain (Gas, Constipation, Bloated, On and off pain X 2 weeks- No blood)  Abdominal Pain This is a chronic problem. The current episode started 1 to 4 weeks ago. The onset quality is undetermined. The problem has been unchanged. The pain is located in the generalized abdominal region. Associated symptoms include constipation. Pertinent negatives include no fever, headaches, nausea or vomiting.  He has also noted a decrease in stool amount - now only passing a small amount each day, along with more gas. He take Senekot and Miralax  but holds the doses on the days before HD. HD says he has a lot of extra fluid and is trying to get that removed with more sessions. He denies nausea or vomiting, fever or chills, decrease in appetite.   Review of Systems  Constitutional:  Negative for chills, fatigue and fever.  Respiratory:  Negative for chest tightness and shortness of breath.   Cardiovascular:  Negative for chest pain and palpitations.  Gastrointestinal:  Positive for abdominal pain and constipation. Negative for anal bleeding, nausea, rectal pain and vomiting.  Neurological:  Positive for light-headedness. Negative for dizziness and headaches.  Psychiatric/Behavioral:  Positive for sleep disturbance. Negative for dysphoric mood. The patient is not nervous/anxious.      Lab Results  Component Value Date   NA 135 11/08/2023   K 4.8 11/08/2023   CO2 25 11/08/2023   GLUCOSE 91 11/08/2023   BUN 41 (H) 11/08/2023   CREATININE 5.86 (H) 11/08/2023   CALCIUM  8.9 11/08/2023   EGFR 14 (L) 05/19/2022   GFRNONAA 9 (L) 11/08/2023   Lab Results  Component Value Date   CHOL 127 07/19/2023   HDL 49 07/19/2023   LDLCALC 65 07/19/2023   TRIG 59 07/19/2023   CHOLHDL 2.6 07/19/2023   Lab Results  Component Value Date   TSH 4.523 (H) 11/07/2023   Lab Results  Component Value Date    HGBA1C 4.7 (L) 08/06/2023   Lab Results  Component Value Date   WBC 4.8 11/08/2023   HGB 9.2 (L) 11/08/2023   HCT 28.2 (L) 11/08/2023   MCV 92.2 11/08/2023   PLT 86 (L) 11/08/2023   Lab Results  Component Value Date   ALT 28 11/06/2023   AST 44 (H) 11/06/2023   ALKPHOS 167 (H) 11/06/2023   BILITOT 1.5 (H) 11/06/2023   Lab Results  Component Value Date   VD25OH 83 09/06/2022     Patient Active Problem List   Diagnosis Date Noted   Abnormal EKG 11/08/2023   Tachyarrhythmia 11/06/2023   Dyslipidemia 09/30/2023   ESRD (end stage renal disease) (HCC) 09/30/2023   Non-sustained ventricular tachycardia (HCC) 09/29/2023   Hypothyroidism 09/29/2023   Tremor of both hands 09/05/2023   Pruritus due to systemic disorder 09/05/2023   Hypotension 08/07/2023   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   HFrEF (heart failure with reduced ejection fraction) (HCC) 07/19/2023   Muscle tension headache 11/15/2022   Acquired thrombophilia (HCC) 11/10/2022   ESRD (end stage renal disease) on dialysis (HCC) 09/16/2022   Physical deconditioning 09/16/2022   Bilateral leg edema 09/13/2022   OSA (obstructive sleep apnea) 07/22/2022   Chronic systolic CHF (congestive heart failure) (HCC) 07/15/2022   Typical atrial flutter (HCC) 07/15/2022   Acquired hypothyroidism 05/28/2022   GERD without esophagitis 05/28/2022   Essential hypertension 05/05/2022   Prediabetes 04/06/2022  Spinal stenosis of lumbar region without neurogenic claudication 04/06/2022   Weakness of both lower extremities 04/06/2022   Upper airway cough syndrome 10/01/2021   Hyperkalemia 01/16/2020   Bursitis of hip 12/18/2018   Paroxysmal atrial fibrillation (HCC) 06/10/2018   Atherosclerosis of native arteries of extremity with intermittent claudication (HCC) 06/25/2016   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   Coronary artery disease involving native coronary artery with angina pectoris (HCC)    Allergic  rhinitis 08/19/2015   Anemia 10/04/2014   AA (aortic aneurysm) (HCC) 10/04/2014   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   Basal cell papilloma 10/04/2014   Unspecified osteoarthritis, unspecified site 10/04/2014   Thoracic aortic aneurysm (TAA) (HCC) 10/04/2014   Atherosclerotic heart disease of native coronary artery without angina pectoris 10/04/2014   Benign prostatic hyperplasia with lower urinary tract symptoms 10/24/2012   DOE (dyspnea on exertion) 11/12/2009   Renal artery stenosis     Mixed hyperlipidemia 09/04/2008   History of redo bypass grafting 09/04/2008    Allergies  Allergen Reactions   Predicort [Prednisolone] Other (See Comments)    Stomach pain   Ciprofloxacin Other (See Comments)    GI upset   Hydrochlorothiazide Other (See Comments)    Dehydration   Hydrocodone Nausea Only and Other (See Comments)    Stomach upset    Hydrocodone-Acetaminophen  Nausea Only    Stomach upset   Sulfa Antibiotics Other (See Comments)    Cannot recall   Penicillins Hives, Rash and Other (See Comments)    Has patient had a PCN reaction causing immediate rash, facial/tongue/throat swelling, SOB or lightheadedness with hypotension: YES  Has patient had a PCN reaction causing severe rash involving mucus membranes or skin necrosis: NO  Has patient had a PCN reaction that required hospitalization NO  Has patient had a PCN reaction occurring within the last 10 years:NO  If all of the above answers are NO, then may proceed with Cephalosporin use.  Has patient had a PCN reaction causing immediate rash, facial/tongue/throat swelling, SOB or lightheadedness with hypotension: YES Has patient had a PCN reaction causing severe rash involving mucus membranes or skin necrosis: NO Has patient had a PCN reaction that required hospitalization NO Has patient had a PCN reaction occurring within the last 10 years:NO If all of the above answers are NO, then may proceed with Cephalosporin use.    Past  Surgical History:  Procedure Laterality Date   CARDIAC CATHETERIZATION  several   CARDIAC CATHETERIZATION N/A 12/13/2014   Procedure: Left Heart Cath and Coronary Angiography;  Surgeon: Candyce GORMAN Reek, MD;  Location: [facility] INVASIVE CV LAB;  Service: Cardiovascular;  Laterality: N/A;   CARDIAC CATHETERIZATION  12/13/2014   Procedure: Coronary Stent Intervention;  Surgeon: Candyce GORMAN Reek, MD;  Location: [facility] INVASIVE CV LAB;  Service: Cardiovascular;;   CARDIAC CATHETERIZATION N/A 10/07/2015   Procedure: Left Heart Cath and Cors/Grafts Angiography;  Surgeon: Lonni JONETTA Cash, MD;  Location: [facility] INVASIVE CV LAB;  Service: Cardiovascular;  Laterality: N/A;   CARDIAC CATHETERIZATION N/A 10/07/2015   Procedure: Coronary Stent Intervention;  Surgeon: Lonni JONETTA Cash, MD;  Location: [facility] INVASIVE CV LAB;  Service: Cardiovascular;  Laterality: N/A;   CARDIOVERSION N/A 05/25/2022   Procedure: CARDIOVERSION;  Surgeon: Sheena Pugh, DO;  Location: [facility] ENDOSCOPY;  Service: Cardiovascular;  Laterality: N/A;   CATARACT EXTRACTION W/PHACO Left 10/21/2021   Procedure: CATARACT EXTRACTION PHACO AND INTRAOCULAR LENS PLACEMENT (IOC) LEFT 3.94 00:33.7;  Surgeon: Myrna Adine Anes, MD;  Location: [facility];  Service: Ophthalmology;  Laterality: Left;   CATARACT EXTRACTION W/PHACO Right 11/09/2021   Procedure: CATARACT EXTRACTION PHACO AND INTRAOCULAR LENS PLACEMENT (IOC) RIGHT;  Surgeon: Myrna Adine Anes, MD;  Location: [facility]GERY [facility]TR;  Service: Ophthalmology;  Laterality: Right;  3.59 0:29.4   CORONARY ANGIOPLASTY  several   CORONARY ANGIOPLASTY WITH STENT PLACEMENT  2005; 12/13/2014   2; 1   CORONARY ARTERY BYPASS GRAFT  05/31/1994   CABG X5   CORONARY ARTERY BYPASS GRAFT  07/29/2005   CABG X3   DIALYSIS/PERMA CATHETER INSERTION N/A 09/10/2022   Procedure: DIALYSIS/PERMA CATHETER INSERTION;  Surgeon: Jama Cordella MATSU, MD;  Location: [facility] INVASIVE CV LAB;  Service:  Cardiovascular;  Laterality: N/A;   ESOPHAGOGASTRODUODENOSCOPY (EGD) WITH ESOPHAGEAL DILATION  05/31/1998   GREEN LIGHT LASER TURP (TRANSURETHRAL RESECTION OF PROSTATE  01/30/1999   not cancerous   HERNIA REPAIR     LAPAROSCOPIC CHOLECYSTECTOMY     LEFT HEART CATH AND CORS/GRAFTS ANGIOGRAPHY N/A 07/28/2017   Procedure: LEFT HEART CATH AND CORS/GRAFTS ANGIOGRAPHY;  Surgeon: Burnard Debby LABOR, MD;  Location: [facility] INVASIVE CV LAB;  Service: Cardiovascular;  Laterality: N/A;   LUNG SURGERY  05/31/1994   S/P CABG, had to put staple in lung after it had collapsed   MINOR REMOVAL OF PERITONEAL DIALYSIS CATHETER  10/2022   UMBILICAL HERNIA REPAIR     w/chole    Social History   Tobacco Use   Smoking status: Former    Current packs/day: 0.00    Average packs/day: 3.0 packs/day for 20.0 years (60.0 ttl pk-yrs)    Types: Cigarettes    Start date: 07/25/1966    Quit date: 07/25/1986    Years since quitting: 37.4   Smokeless tobacco: Never  Vaping Use   Vaping status: Never Used  Substance Use Topics   Alcohol use: No    Alcohol/week: 0.0 standard drinks of alcohol   Drug use: No     Medication list has been reviewed and updated.  Current Meds  Medication Sig   allopurinol  (ZYLOPRIM ) 100 MG tablet TAKE 1 TABLET BY MOUTH EVERYDAY AT BEDTIME   amiodarone  (PACERONE ) 200 MG tablet Take 2 tablets (400 mg total) by mouth daily.   aspirin  EC 81 MG tablet Take 1 tablet (81 mg total) by mouth daily. Swallow whole.   atorvastatin  (LIPITOR) 40 MG tablet TAKE 1 TABLET BY MOUTH EVERYDAY AT BEDTIME   Cholecalciferol  (VITAMIN D -3) 125 MCG (5000 UT) TABS Take 2,000 Units by mouth daily.    doxepin  (SINEQUAN ) 10 MG capsule Take 1 capsule (10 mg total) by mouth at bedtime. (Patient taking differently: Take 10 mg by mouth at bedtime as needed.)   furosemide  (LASIX ) 20 MG tablet Take 60 mg by mouth daily as needed for edema.   levothyroxine  (SYNTHROID ) 75 MCG tablet TAKE 1 TABLET BY MOUTH EVERY DAY BEFORE  BREAKFAST   midodrine  (PROAMATINE ) 5 MG tablet Take 1 tablet (5 mg total) by mouth 3 (three) times daily with meals.   Multiple Vitamin (MULTIVITAMIN PO) Take 1 tablet by mouth daily.   nitroGLYCERIN  (NITROSTAT ) 0.4 MG SL tablet Place 0.4 mg under the tongue every 5 (five) minutes as needed for chest pain (Up to 3 times).   pantoprazole  (PROTONIX ) 40 MG tablet Take 1 tablet (40 mg total) by mouth 2 (two) times daily. Home med.   senna (SENOKOT) 8.6 MG tablet Take 1 tablet by mouth as needed for constipation.       10/07/2023    2:07 PM 08/15/2023    1:44  PM 07/26/2023    1:58 PM 07/19/2023    9:37 AM  GAD 7 : Generalized Anxiety Score  Nervous, Anxious, on Edge 3 2 0 0  Control/stop worrying 1 0 0 1  Worry too much - different things 1 0 0 1  Trouble relaxing 1 0 0 1  Restless 1 2 0 1  Easily annoyed or irritable 1 2 0 0  Afraid - awful might happen 1 2 0 0  Total GAD 7 Score 9 8 0 4  Anxiety Difficulty Somewhat difficult  Not difficult at all Not difficult at all       10/07/2023    2:07 PM 08/15/2023    1:44 PM 07/26/2023    1:58 PM  Depression screen PHQ 2/9  Decreased Interest 3 0 0  Down, Depressed, Hopeless 0 0 0  PHQ - 2 Score 3 0 0  Altered sleeping 1 0 0  Tired, decreased energy 3 3 0  Change in appetite 0 0 0  Feeling bad or failure about yourself  0 0 0  Trouble concentrating 0 0 0  Moving slowly or fidgety/restless 3 3 0  Suicidal thoughts 0 0 0  PHQ-9 Score 10 6 0  Difficult doing work/chores Extremely dIfficult Somewhat difficult Not difficult at all    BP Readings from Last 3 Encounters:  12/21/23 128/74  11/22/23 120/64  11/15/23 124/76    Physical Exam Vitals and nursing note reviewed.  Constitutional:      General: He is not in acute distress.    Appearance: He is well-developed. He is obese.  HENT:     Head: Normocephalic and atraumatic.  Cardiovascular:     Rate and Rhythm: Normal rate and regular rhythm.  Pulmonary:     Effort: Pulmonary  effort is normal. No respiratory distress.  Abdominal:     General: Bowel sounds are decreased. There is distension.     Palpations: Abdomen is soft. There is no fluid wave or hepatomegaly.     Tenderness: There is abdominal tenderness in the left upper quadrant. There is no guarding or rebound.  Skin:    General: Skin is warm and dry.     Findings: No rash.  Neurological:     Mental Status: He is alert and oriented to person, place, and time.  Psychiatric:        Mood and Affect: Mood normal.        Behavior: Behavior normal.     Wt Readings from Last 3 Encounters:  12/21/23 202 lb (91.6 kg)  11/22/23 205 lb 6 oz (93.2 kg)  11/15/23 205 lb 12.8 oz (93.4 kg)    BP 128/74   Pulse 93   Temp 97.8 F (36.6 C) (Oral)   Ht 5' 9 (1.753 m)   Wt 202 lb (91.6 kg)   SpO2 94%   BMI 29.83 kg/m   Assessment and Plan:  Problem List Items Addressed This Visit       Unprioritized   Hypotension   Relevant Medications   midodrine  (PROAMATINE ) 5 MG tablet   Other Visit Diagnoses       Generalized abdominal pain    -  Primary   suspect obstipation - will get KUB, CBC and amylase rec Miralax  and Senekot every day - adjust timing to HD schedule   Relevant Orders   DG Abd 1 View   Amylase   CBC with Differential/Platelet       No follow-ups on file.  Leita HILARIO Adie, MD [facility] [place]

## 2023-12-21 NOTE — Progress Notes (Unsigned)
 Date:  12/21/2023   Name:  Ronald [name] [facility]   DOB:  17-Nov-1945   MRN:  [id number]   Chief Complaint: No chief complaint on file.  HPI  Review of Systems   Lab Results  Component Value Date   NA 135 11/08/2023   K 4.8 11/08/2023   CO2 25 11/08/2023   GLUCOSE 91 11/08/2023   BUN 41 (H) 11/08/2023   CREATININE 5.86 (H) 11/08/2023   CALCIUM  8.9 11/08/2023   EGFR 14 (L) 05/19/2022   GFRNONAA 9 (L) 11/08/2023   Lab Results  Component Value Date   CHOL 127 07/19/2023   HDL 49 07/19/2023   LDLCALC 65 07/19/2023   TRIG 59 07/19/2023   CHOLHDL 2.6 07/19/2023   Lab Results  Component Value Date   TSH 4.523 (H) 11/07/2023   Lab Results  Component Value Date   HGBA1C 4.7 (L) 08/06/2023   Lab Results  Component Value Date   WBC 4.7 12/21/2023   HGB 10.2 (L) 12/21/2023   HCT 30.6 (L) 12/21/2023   MCV 89.7 12/21/2023   PLT 115 (L) 12/21/2023   Lab Results  Component Value Date   ALT 28 11/06/2023   AST 44 (H) 11/06/2023   ALKPHOS 167 (H) 11/06/2023   BILITOT 1.5 (H) 11/06/2023   Lab Results  Component Value Date   VD25OH 83 09/06/2022     Patient Active Problem List   Diagnosis Date Noted   Abnormal EKG 11/08/2023   Tachyarrhythmia 11/06/2023   Dyslipidemia 09/30/2023   ESRD (end stage renal disease) (HCC) 09/30/2023   Non-sustained ventricular tachycardia (HCC) 09/29/2023   Hypothyroidism 09/29/2023   Tremor of both hands 09/05/2023   Pruritus due to systemic disorder 09/05/2023   Hypotension 08/07/2023   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   HFrEF (heart failure with reduced ejection fraction) (HCC) 07/19/2023   Muscle tension headache 11/15/2022   Acquired thrombophilia (HCC) 11/10/2022   ESRD (end stage renal disease) on dialysis (HCC) 09/16/2022   Physical deconditioning 09/16/2022   Bilateral leg edema 09/13/2022   OSA (obstructive sleep apnea) 07/22/2022   Chronic systolic CHF (congestive heart failure) (HCC) 07/15/2022   Typical  atrial flutter (HCC) 07/15/2022   Acquired hypothyroidism 05/28/2022   GERD without esophagitis 05/28/2022   Essential hypertension 05/05/2022   Prediabetes 04/06/2022   Spinal stenosis of lumbar region without neurogenic claudication 04/06/2022   Weakness of both lower extremities 04/06/2022   Upper airway cough syndrome 10/01/2021   Hyperkalemia 01/16/2020   Bursitis of hip 12/18/2018   Paroxysmal atrial fibrillation (HCC) 06/10/2018   Atherosclerosis of native arteries of extremity with intermittent claudication (HCC) 06/25/2016   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   Coronary artery disease involving native coronary artery with angina pectoris (HCC)    Allergic rhinitis 08/19/2015   Anemia 10/04/2014   AA (aortic aneurysm) (HCC) 10/04/2014   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   Basal cell papilloma 10/04/2014   Unspecified osteoarthritis, unspecified site 10/04/2014   Thoracic aortic aneurysm (TAA) (HCC) 10/04/2014   Atherosclerotic heart disease of native coronary artery without angina pectoris 10/04/2014   Benign prostatic hyperplasia with lower urinary tract symptoms 10/24/2012   DOE (dyspnea on exertion) 11/12/2009   Renal artery stenosis     Mixed hyperlipidemia 09/04/2008   History of redo bypass grafting 09/04/2008    Allergies  Allergen Reactions   Predicort [Prednisolone] Other (See Comments)    Stomach pain   Ciprofloxacin Other (See Comments)    GI upset   Hydrochlorothiazide Other (See  Comments)    Dehydration   Hydrocodone Nausea Only and Other (See Comments)    Stomach upset    Hydrocodone-Acetaminophen  Nausea Only    Stomach upset   Sulfa Antibiotics Other (See Comments)    Cannot recall   Penicillins Hives, Rash and Other (See Comments)    Has patient had a PCN reaction causing immediate rash, facial/tongue/throat swelling, SOB or lightheadedness with hypotension: YES  Has patient had a PCN reaction causing severe rash involving mucus membranes or skin  necrosis: NO  Has patient had a PCN reaction that required hospitalization NO  Has patient had a PCN reaction occurring within the last 10 years:NO  If all of the above answers are NO, then may proceed with Cephalosporin use.  Has patient had a PCN reaction causing immediate rash, facial/tongue/throat swelling, SOB or lightheadedness with hypotension: YES Has patient had a PCN reaction causing severe rash involving mucus membranes or skin necrosis: NO Has patient had a PCN reaction that required hospitalization NO Has patient had a PCN reaction occurring within the last 10 years:NO If all of the above answers are NO, then may proceed with Cephalosporin use.    Past Surgical History:  Procedure Laterality Date   CARDIAC CATHETERIZATION  several   CARDIAC CATHETERIZATION N/A 12/13/2014   Procedure: Left Heart Cath and Coronary Angiography;  Surgeon: Candyce GORMAN Reek, MD;  Location: [facility] INVASIVE CV LAB;  Service: Cardiovascular;  Laterality: N/A;   CARDIAC CATHETERIZATION  12/13/2014   Procedure: Coronary Stent Intervention;  Surgeon: Candyce GORMAN Reek, MD;  Location: [facility] INVASIVE CV LAB;  Service: Cardiovascular;;   CARDIAC CATHETERIZATION N/A 10/07/2015   Procedure: Left Heart Cath and Cors/Grafts Angiography;  Surgeon: Lonni JONETTA Cash, MD;  Location: [facility] INVASIVE CV LAB;  Service: Cardiovascular;  Laterality: N/A;   CARDIAC CATHETERIZATION N/A 10/07/2015   Procedure: Coronary Stent Intervention;  Surgeon: Lonni JONETTA Cash, MD;  Location: [facility] INVASIVE CV LAB;  Service: Cardiovascular;  Laterality: N/A;   CARDIOVERSION N/A 05/25/2022   Procedure: CARDIOVERSION;  Surgeon: Sheena Pugh, DO;  Location: [facility] ENDOSCOPY;  Service: Cardiovascular;  Laterality: N/A;   CATARACT EXTRACTION W/PHACO Left 10/21/2021   Procedure: CATARACT EXTRACTION PHACO AND INTRAOCULAR LENS PLACEMENT (IOC) LEFT 3.94 00:33.7;  Surgeon: Myrna Adine Anes, MD;  Location: [facility] SURGERY [facility];  Service:  Ophthalmology;  Laterality: Left;   CATARACT EXTRACTION W/PHACO Right 11/09/2021   Procedure: CATARACT EXTRACTION PHACO AND INTRAOCULAR LENS PLACEMENT (IOC) RIGHT;  Surgeon: Myrna Adine Anes, MD;  Location: [facility];  Service: Ophthalmology;  Laterality: Right;  3.59 0:29.4   CORONARY ANGIOPLASTY  several   CORONARY ANGIOPLASTY WITH STENT PLACEMENT  2005; 12/13/2014   2; 1   CORONARY ARTERY BYPASS GRAFT  05/31/1994   CABG X5   CORONARY ARTERY BYPASS GRAFT  07/29/2005   CABG X3   DIALYSIS/PERMA CATHETER INSERTION N/A 09/10/2022   Procedure: DIALYSIS/PERMA CATHETER INSERTION;  Surgeon: Jama Cordella MATSU, MD;  Location: [facility] INVASIVE CV LAB;  Service: Cardiovascular;  Laterality: N/A;   ESOPHAGOGASTRODUODENOSCOPY (EGD) WITH ESOPHAGEAL DILATION  05/31/1998   GREEN LIGHT LASER TURP (TRANSURETHRAL RESECTION OF PROSTATE  01/30/1999   not cancerous   HERNIA REPAIR     LAPAROSCOPIC CHOLECYSTECTOMY     LEFT HEART CATH AND CORS/GRAFTS ANGIOGRAPHY N/A 07/28/2017   Procedure: LEFT HEART CATH AND CORS/GRAFTS ANGIOGRAPHY;  Surgeon: Burnard Debby LABOR, MD;  Location: [facility] INVASIVE CV LAB;  Service: Cardiovascular;  Laterality: N/A;   LUNG SURGERY  05/31/1994   S/P CABG, had to  put staple in lung after it had collapsed   MINOR REMOVAL OF PERITONEAL DIALYSIS CATHETER  10/2022   UMBILICAL HERNIA REPAIR     w/chole    Social History   Tobacco Use   Smoking status: Former    Current packs/day: 0.00    Average packs/day: 3.0 packs/day for 20.0 years (60.0 ttl pk-yrs)    Types: Cigarettes    Start date: 07/25/1966    Quit date: 07/25/1986    Years since quitting: 37.4   Smokeless tobacco: Never  Vaping Use   Vaping status: Never Used  Substance Use Topics   Alcohol use: No    Alcohol/week: 0.0 standard drinks of alcohol   Drug use: No     Medication list has been reviewed and updated.  No outpatient medications have been marked as taking for the 12/21/23 encounter (Orders Only)  with Ronald Ronald DEL, MD.       10/07/2023    2:07 PM 08/15/2023    1:44 PM 07/26/2023    1:58 PM 07/19/2023    9:37 AM  GAD 7 : Generalized Anxiety Score  Nervous, Anxious, on Edge 3 2 0 0  Control/stop worrying 1 0 0 1  Worry too much - different things 1 0 0 1  Trouble relaxing 1 0 0 1  Restless 1 2 0 1  Easily annoyed or irritable 1 2 0 0  Afraid - awful might happen 1 2 0 0  Total GAD 7 Score 9 8 0 4  Anxiety Difficulty Somewhat difficult  Not difficult at all Not difficult at all       10/07/2023    2:07 PM 08/15/2023    1:44 PM 07/26/2023    1:58 PM  Depression screen PHQ 2/9  Decreased Interest 3 0 0  Down, Depressed, Hopeless 0 0 0  PHQ - 2 Score 3 0 0  Altered sleeping 1 0 0  Tired, decreased energy 3 3 0  Change in appetite 0 0 0  Feeling bad or failure about yourself  0 0 0  Trouble concentrating 0 0 0  Moving slowly or fidgety/restless 3 3 0  Suicidal thoughts 0 0 0  PHQ-9 Score 10 6 0  Difficult doing work/chores Extremely dIfficult Somewhat difficult Not difficult at all    BP Readings from Last 3 Encounters:  12/21/23 128/74  11/22/23 120/64  11/15/23 124/76    Physical Exam  Wt Readings from Last 3 Encounters:  12/21/23 202 lb (91.6 kg)  11/22/23 205 lb 6 oz (93.2 kg)  11/15/23 205 lb 12.8 oz (93.4 kg)    There were no vitals taken for this visit.  Assessment and Plan:  Problem List Items Addressed This Visit   None   No follow-ups on file.    Ronald HILARIO Justus, MD [facility] [place]

## 2023-12-22 ENCOUNTER — Encounter: Admitting: Physical Therapy

## 2023-12-22 ENCOUNTER — Ambulatory Visit: Payer: Self-pay | Admitting: Internal Medicine

## 2023-12-23 DIAGNOSIS — Z23 Encounter for immunization: Secondary | ICD-10-CM | POA: Diagnosis not present

## 2023-12-23 DIAGNOSIS — Z992 Dependence on renal dialysis: Secondary | ICD-10-CM | POA: Diagnosis not present

## 2023-12-23 DIAGNOSIS — N186 End stage renal disease: Secondary | ICD-10-CM | POA: Diagnosis not present

## 2023-12-26 DIAGNOSIS — Z992 Dependence on renal dialysis: Secondary | ICD-10-CM | POA: Diagnosis not present

## 2023-12-26 DIAGNOSIS — N186 End stage renal disease: Secondary | ICD-10-CM | POA: Diagnosis not present

## 2023-12-26 DIAGNOSIS — Z23 Encounter for immunization: Secondary | ICD-10-CM | POA: Diagnosis not present

## 2023-12-27 ENCOUNTER — Encounter: Admitting: Physical Therapy

## 2023-12-28 ENCOUNTER — Other Ambulatory Visit: Payer: Self-pay

## 2023-12-28 DIAGNOSIS — Z992 Dependence on renal dialysis: Secondary | ICD-10-CM | POA: Diagnosis not present

## 2023-12-28 DIAGNOSIS — Z23 Encounter for immunization: Secondary | ICD-10-CM | POA: Diagnosis not present

## 2023-12-28 DIAGNOSIS — N186 End stage renal disease: Secondary | ICD-10-CM | POA: Diagnosis not present

## 2023-12-28 MED ORDER — AMIODARONE HCL 200 MG PO TABS: 400.0000 mg | ORAL_TABLET | Freq: Every day | ORAL | 1 refills | Status: DC

## 2023-12-29 ENCOUNTER — Encounter: Admitting: Physical Therapy

## 2023-12-29 DIAGNOSIS — Z992 Dependence on renal dialysis: Secondary | ICD-10-CM | POA: Diagnosis not present

## 2023-12-29 DIAGNOSIS — N186 End stage renal disease: Secondary | ICD-10-CM | POA: Diagnosis not present

## 2023-12-30 DIAGNOSIS — Z23 Encounter for immunization: Secondary | ICD-10-CM | POA: Diagnosis not present

## 2023-12-30 DIAGNOSIS — Z992 Dependence on renal dialysis: Secondary | ICD-10-CM | POA: Diagnosis not present

## 2023-12-30 DIAGNOSIS — N186 End stage renal disease: Secondary | ICD-10-CM | POA: Diagnosis not present

## 2023-12-31 ENCOUNTER — Other Ambulatory Visit: Payer: Self-pay | Admitting: Internal Medicine

## 2023-12-31 DIAGNOSIS — M109 Gout, unspecified: Secondary | ICD-10-CM

## 2024-01-02 DIAGNOSIS — Z992 Dependence on renal dialysis: Secondary | ICD-10-CM | POA: Diagnosis not present

## 2024-01-02 DIAGNOSIS — Z23 Encounter for immunization: Secondary | ICD-10-CM | POA: Diagnosis not present

## 2024-01-02 DIAGNOSIS — N186 End stage renal disease: Secondary | ICD-10-CM | POA: Diagnosis not present

## 2024-01-02 NOTE — Telephone Encounter (Signed)
 Requested Prescriptions  Pending Prescriptions Disp Refills   allopurinol  (ZYLOPRIM ) 100 MG tablet [Pharmacy Med Name: ALLOPURINOL  100 MG TABLET] 90 tablet 0    Sig: TAKE 1 TABLET BY MOUTH EVERYDAY AT BEDTIME     Endocrinology:  Gout Agents - allopurinol  Failed - 01/02/2024  2:32 PM      Failed - Uric Acid in normal range and within 360 days    Uric Acid  Date Value Ref Range Status  07/19/2023 2.9 (L) 3.8 - 8.4 mg/dL Final    Comment:               Therapeutic target for gout patients: <6.0         Failed - Cr in normal range and within 360 days    Creat  Date Value Ref Range Status  11/24/2015 2.44 (H) 0.70 - 1.18 mg/dL Final    Comment:      For patients > or = 80 years of age: The upper reference limit for Creatinine is approximately 13% higher for people identified as African-American.      Creatinine, Ser  Date Value Ref Range Status  11/08/2023 5.86 (H) 0.61 - 1.24 mg/dL Final         Failed - CBC within normal limits and completed in the last 12 months    WBC  Date Value Ref Range Status  12/21/2023 4.7 4.0 - 10.5 K/uL Final   RBC  Date Value Ref Range Status  12/21/2023 3.41 (L) 4.22 - 5.81 MIL/uL Final   Hemoglobin  Date Value Ref Range Status  12/21/2023 10.2 (L) 13.0 - 17.0 g/dL Final  87/79/7976 9.9 (L) 13.0 - 17.7 g/dL Final   HCT  Date Value Ref Range Status  12/21/2023 30.6 (L) 39.0 - 52.0 % Final   Hematocrit  Date Value Ref Range Status  05/19/2022 30.4 (L) 37.5 - 51.0 % Final   MCHC  Date Value Ref Range Status  12/21/2023 33.3 30.0 - 36.0 g/dL Final   [facility]  Date Value Ref Range Status  12/21/2023 29.9 26.0 - 34.0 pg Final   MCV  Date Value Ref Range Status  12/21/2023 89.7 80.0 - 100.0 fL Final  05/19/2022 88 79 - 97 fL Final   No results found for: PLTCOUNTKUC, LABPLAT, POCPLA RDW  Date Value Ref Range Status  12/21/2023 16.7 (H) 11.5 - 15.5 % Final  05/19/2022 14.8 11.6 - 15.4 % Final         Passed - Valid encounter  within last 12 months    Recent Outpatient Visits           1 week ago Generalized abdominal pain   [place] [facility], Leita DEL, MD   1 month ago Pruritus due to systemic disorder   [place] [facility] at [facility], Leita DEL, MD   2 months ago Spinal stenosis of lumbar region without neurogenic claudication   [facility] at [facility], Leita DEL, MD   3 months ago Pruritus due to systemic disorder   [facility] at [facility], Leita DEL, MD   4 months ago Iron deficiency anemia due to chronic blood loss   [facility] at [facility], Leita DEL, MD

## 2024-01-03 ENCOUNTER — Encounter: Admitting: Physical Therapy

## 2024-01-04 DIAGNOSIS — N186 End stage renal disease: Secondary | ICD-10-CM | POA: Diagnosis not present

## 2024-01-04 DIAGNOSIS — Z992 Dependence on renal dialysis: Secondary | ICD-10-CM | POA: Diagnosis not present

## 2024-01-04 DIAGNOSIS — Z23 Encounter for immunization: Secondary | ICD-10-CM | POA: Diagnosis not present

## 2024-01-05 ENCOUNTER — Encounter: Admitting: Physical Therapy

## 2024-01-06 DIAGNOSIS — Z23 Encounter for immunization: Secondary | ICD-10-CM | POA: Diagnosis not present

## 2024-01-06 DIAGNOSIS — Z992 Dependence on renal dialysis: Secondary | ICD-10-CM | POA: Diagnosis not present

## 2024-01-06 DIAGNOSIS — N186 End stage renal disease: Secondary | ICD-10-CM | POA: Diagnosis not present

## 2024-01-09 DIAGNOSIS — Z23 Encounter for immunization: Secondary | ICD-10-CM | POA: Diagnosis not present

## 2024-01-09 DIAGNOSIS — Z992 Dependence on renal dialysis: Secondary | ICD-10-CM | POA: Diagnosis not present

## 2024-01-09 DIAGNOSIS — N186 End stage renal disease: Secondary | ICD-10-CM | POA: Diagnosis not present

## 2024-01-11 DIAGNOSIS — N186 End stage renal disease: Secondary | ICD-10-CM | POA: Diagnosis not present

## 2024-01-11 DIAGNOSIS — Z23 Encounter for immunization: Secondary | ICD-10-CM | POA: Diagnosis not present

## 2024-01-11 DIAGNOSIS — Z992 Dependence on renal dialysis: Secondary | ICD-10-CM | POA: Diagnosis not present

## 2024-01-12 ENCOUNTER — Other Ambulatory Visit: Payer: Self-pay | Admitting: Internal Medicine

## 2024-01-12 DIAGNOSIS — E039 Hypothyroidism, unspecified: Secondary | ICD-10-CM

## 2024-01-13 DIAGNOSIS — N186 End stage renal disease: Secondary | ICD-10-CM | POA: Diagnosis not present

## 2024-01-13 DIAGNOSIS — Z992 Dependence on renal dialysis: Secondary | ICD-10-CM | POA: Diagnosis not present

## 2024-01-13 DIAGNOSIS — Z23 Encounter for immunization: Secondary | ICD-10-CM | POA: Diagnosis not present

## 2024-01-16 ENCOUNTER — Encounter: Payer: Self-pay | Admitting: Internal Medicine

## 2024-01-16 DIAGNOSIS — Z23 Encounter for immunization: Secondary | ICD-10-CM | POA: Diagnosis not present

## 2024-01-16 DIAGNOSIS — N186 End stage renal disease: Secondary | ICD-10-CM | POA: Diagnosis not present

## 2024-01-16 DIAGNOSIS — Z992 Dependence on renal dialysis: Secondary | ICD-10-CM | POA: Diagnosis not present

## 2024-01-16 NOTE — Telephone Encounter (Signed)
 Please review and advise.  JM

## 2024-01-16 NOTE — Telephone Encounter (Signed)
 Requested Prescriptions  Pending Prescriptions Disp Refills   levothyroxine  (SYNTHROID ) 75 MCG tablet [Pharmacy Med Name: LEVOTHYROXINE  75 MCG TABLET] 90 tablet 0    Sig: TAKE 1 TABLET BY MOUTH EVERY DAY BEFORE BREAKFAST     Endocrinology:  Hypothyroid Agents Failed - 01/16/2024  9:19 AM      Failed - TSH in normal range and within 360 days    TSH  Date Value Ref Range Status  11/07/2023 4.523 (H) 0.350 - 4.500 uIU/mL Final    Comment:    Performed by a 3rd Generation assay with a functional sensitivity of <=0.01 uIU/mL. Performed at [facility], [address] [place], [place] [address]   07/19/2023 8.090 (H) 0.450 - 4.500 uIU/mL Final         Passed - Valid encounter within last 12 months    Recent Outpatient Visits           3 weeks ago Generalized abdominal pain   [place] [facility] at [facility], Leita DEL, MD   2 months ago Pruritus due to systemic disorder   [facility] at [facility], Leita DEL, MD   3 months ago Spinal stenosis of lumbar region without neurogenic claudication   [facility] at [facility], Leita DEL, MD   4 months ago Pruritus due to systemic disorder   [facility] Medicine at [facility], Leita DEL, MD   5 months ago Iron deficiency anemia due to chronic blood loss   [facility] & [facility] Medicine at [facility], Leita DEL, MD

## 2024-01-18 DIAGNOSIS — Z992 Dependence on renal dialysis: Secondary | ICD-10-CM | POA: Diagnosis not present

## 2024-01-18 DIAGNOSIS — Z23 Encounter for immunization: Secondary | ICD-10-CM | POA: Diagnosis not present

## 2024-01-18 DIAGNOSIS — N186 End stage renal disease: Secondary | ICD-10-CM | POA: Diagnosis not present

## 2024-01-19 ENCOUNTER — Encounter (INDEPENDENT_AMBULATORY_CARE_PROVIDER_SITE_OTHER): Payer: Medicare Other

## 2024-01-19 ENCOUNTER — Ambulatory Visit (INDEPENDENT_AMBULATORY_CARE_PROVIDER_SITE_OTHER): Payer: Medicare Other | Admitting: Vascular Surgery

## 2024-01-20 ENCOUNTER — Other Ambulatory Visit: Payer: Self-pay | Admitting: Internal Medicine

## 2024-01-20 DIAGNOSIS — N186 End stage renal disease: Secondary | ICD-10-CM | POA: Diagnosis not present

## 2024-01-20 DIAGNOSIS — Z23 Encounter for immunization: Secondary | ICD-10-CM | POA: Diagnosis not present

## 2024-01-20 DIAGNOSIS — K219 Gastro-esophageal reflux disease without esophagitis: Secondary | ICD-10-CM

## 2024-01-20 DIAGNOSIS — Z992 Dependence on renal dialysis: Secondary | ICD-10-CM | POA: Diagnosis not present

## 2024-01-20 NOTE — Telephone Encounter (Signed)
 Requested Prescriptions  Pending Prescriptions Disp Refills   pantoprazole  (PROTONIX ) 40 MG tablet [Pharmacy Med Name: PANTOPRAZOLE  SOD DR 40 MG TAB] 180 tablet 1    Sig: TAKE 1 TABLET (40 MG TOTAL) BY MOUTH 2 (TWO) TIMES DAILY. HOME MED.     Gastroenterology: Proton Pump Inhibitors Passed - 01/20/2024  4:06 PM      Passed - Valid encounter within last 12 months    Recent Outpatient Visits           1 month ago Generalized abdominal pain   [place] [facility] at [facility], Leita DEL, MD   2 months ago Pruritus due to systemic disorder   [facility] at [facility], Leita DEL, MD   3 months ago Spinal stenosis of lumbar region without neurogenic claudication   [facility] at [facility], Leita DEL, MD   4 months ago Pruritus due to systemic disorder   [facility] at [facility], Leita DEL, MD   5 months ago Iron deficiency anemia due to chronic blood loss   [facility] Primary Care & [facility] Medicine at [facility], Leita DEL, MD

## 2024-01-23 DIAGNOSIS — Z23 Encounter for immunization: Secondary | ICD-10-CM | POA: Diagnosis not present

## 2024-01-23 DIAGNOSIS — Z992 Dependence on renal dialysis: Secondary | ICD-10-CM | POA: Diagnosis not present

## 2024-01-23 DIAGNOSIS — N186 End stage renal disease: Secondary | ICD-10-CM | POA: Diagnosis not present

## 2024-01-25 DIAGNOSIS — Z992 Dependence on renal dialysis: Secondary | ICD-10-CM | POA: Diagnosis not present

## 2024-01-25 DIAGNOSIS — N186 End stage renal disease: Secondary | ICD-10-CM | POA: Diagnosis not present

## 2024-01-25 DIAGNOSIS — Z23 Encounter for immunization: Secondary | ICD-10-CM | POA: Diagnosis not present

## 2024-01-26 ENCOUNTER — Encounter: Payer: Self-pay | Admitting: Cardiovascular Disease

## 2024-01-27 DIAGNOSIS — Z992 Dependence on renal dialysis: Secondary | ICD-10-CM | POA: Diagnosis not present

## 2024-01-27 DIAGNOSIS — Z23 Encounter for immunization: Secondary | ICD-10-CM | POA: Diagnosis not present

## 2024-01-27 DIAGNOSIS — N186 End stage renal disease: Secondary | ICD-10-CM | POA: Diagnosis not present

## 2024-01-29 DIAGNOSIS — N186 End stage renal disease: Secondary | ICD-10-CM | POA: Diagnosis not present

## 2024-01-29 DIAGNOSIS — Z992 Dependence on renal dialysis: Secondary | ICD-10-CM | POA: Diagnosis not present

## 2024-01-30 DIAGNOSIS — N186 End stage renal disease: Secondary | ICD-10-CM | POA: Diagnosis not present

## 2024-01-30 DIAGNOSIS — Z992 Dependence on renal dialysis: Secondary | ICD-10-CM | POA: Diagnosis not present

## 2024-02-02 ENCOUNTER — Encounter: Payer: Self-pay | Admitting: Internal Medicine

## 2024-02-02 ENCOUNTER — Encounter: Payer: Self-pay | Admitting: Cardiovascular Disease

## 2024-02-02 NOTE — Telephone Encounter (Signed)
 Please review.  KP

## 2024-02-03 NOTE — Telephone Encounter (Signed)
 Please review.  KP

## 2024-02-06 DIAGNOSIS — N186 End stage renal disease: Secondary | ICD-10-CM | POA: Diagnosis not present

## 2024-02-06 DIAGNOSIS — R079 Chest pain, unspecified: Secondary | ICD-10-CM | POA: Diagnosis not present

## 2024-02-06 DIAGNOSIS — Z743 Need for continuous supervision: Secondary | ICD-10-CM | POA: Diagnosis not present

## 2024-02-06 DIAGNOSIS — D631 Anemia in chronic kidney disease: Secondary | ICD-10-CM | POA: Diagnosis not present

## 2024-02-06 DIAGNOSIS — I501 Left ventricular failure: Secondary | ICD-10-CM | POA: Diagnosis not present

## 2024-02-06 DIAGNOSIS — I472 Ventricular tachycardia, unspecified: Secondary | ICD-10-CM | POA: Diagnosis not present

## 2024-02-07 DIAGNOSIS — I5023 Acute on chronic systolic (congestive) heart failure: Secondary | ICD-10-CM | POA: Diagnosis not present

## 2024-02-07 DIAGNOSIS — R55 Syncope and collapse: Secondary | ICD-10-CM | POA: Diagnosis not present

## 2024-02-07 DIAGNOSIS — I5021 Acute systolic (congestive) heart failure: Secondary | ICD-10-CM | POA: Diagnosis not present

## 2024-02-07 DIAGNOSIS — I9589 Other hypotension: Secondary | ICD-10-CM | POA: Diagnosis not present

## 2024-02-07 DIAGNOSIS — E8770 Fluid overload, unspecified: Secondary | ICD-10-CM | POA: Diagnosis not present

## 2024-02-07 DIAGNOSIS — I509 Heart failure, unspecified: Secondary | ICD-10-CM | POA: Diagnosis not present

## 2024-02-07 DIAGNOSIS — E875 Hyperkalemia: Secondary | ICD-10-CM | POA: Diagnosis not present

## 2024-02-08 DIAGNOSIS — I472 Ventricular tachycardia, unspecified: Secondary | ICD-10-CM | POA: Diagnosis not present

## 2024-02-08 DIAGNOSIS — R55 Syncope and collapse: Secondary | ICD-10-CM | POA: Diagnosis not present

## 2024-02-08 DIAGNOSIS — E875 Hyperkalemia: Secondary | ICD-10-CM | POA: Diagnosis not present

## 2024-02-08 DIAGNOSIS — N186 End stage renal disease: Secondary | ICD-10-CM | POA: Diagnosis not present

## 2024-02-08 DIAGNOSIS — I2510 Atherosclerotic heart disease of native coronary artery without angina pectoris: Secondary | ICD-10-CM | POA: Diagnosis not present

## 2024-02-08 DIAGNOSIS — R001 Bradycardia, unspecified: Secondary | ICD-10-CM | POA: Diagnosis not present

## 2024-02-08 DIAGNOSIS — I9589 Other hypotension: Secondary | ICD-10-CM | POA: Diagnosis not present

## 2024-02-08 DIAGNOSIS — E039 Hypothyroidism, unspecified: Secondary | ICD-10-CM | POA: Diagnosis not present

## 2024-02-08 DIAGNOSIS — Z66 Do not resuscitate: Secondary | ICD-10-CM | POA: Diagnosis not present

## 2024-02-08 DIAGNOSIS — I5082 Biventricular heart failure: Secondary | ICD-10-CM | POA: Diagnosis not present

## 2024-02-08 DIAGNOSIS — I452 Bifascicular block: Secondary | ICD-10-CM | POA: Diagnosis not present

## 2024-02-08 DIAGNOSIS — Z91158 Patient's noncompliance with renal dialysis for other reason: Secondary | ICD-10-CM | POA: Diagnosis not present

## 2024-02-08 DIAGNOSIS — G629 Polyneuropathy, unspecified: Secondary | ICD-10-CM | POA: Diagnosis not present

## 2024-02-08 DIAGNOSIS — E8770 Fluid overload, unspecified: Secondary | ICD-10-CM | POA: Diagnosis not present

## 2024-02-08 DIAGNOSIS — I5A Non-ischemic myocardial injury (non-traumatic): Secondary | ICD-10-CM | POA: Diagnosis not present

## 2024-02-08 DIAGNOSIS — I5084 End stage heart failure: Secondary | ICD-10-CM | POA: Diagnosis not present

## 2024-02-08 DIAGNOSIS — I4510 Unspecified right bundle-branch block: Secondary | ICD-10-CM | POA: Diagnosis not present

## 2024-02-08 DIAGNOSIS — T462X5A Adverse effect of other antidysrhythmic drugs, initial encounter: Secondary | ICD-10-CM | POA: Diagnosis not present

## 2024-02-08 DIAGNOSIS — I5023 Acute on chronic systolic (congestive) heart failure: Secondary | ICD-10-CM | POA: Diagnosis not present

## 2024-02-08 DIAGNOSIS — M1A30X0 Chronic gout due to renal impairment, unspecified site, without tophus (tophi): Secondary | ICD-10-CM | POA: Diagnosis not present

## 2024-02-08 DIAGNOSIS — I509 Heart failure, unspecified: Secondary | ICD-10-CM | POA: Diagnosis not present

## 2024-02-08 DIAGNOSIS — Z992 Dependence on renal dialysis: Secondary | ICD-10-CM | POA: Diagnosis not present

## 2024-02-08 DIAGNOSIS — E785 Hyperlipidemia, unspecified: Secondary | ICD-10-CM | POA: Diagnosis not present

## 2024-02-08 DIAGNOSIS — I5021 Acute systolic (congestive) heart failure: Secondary | ICD-10-CM | POA: Diagnosis not present

## 2024-02-08 DIAGNOSIS — J811 Chronic pulmonary edema: Secondary | ICD-10-CM | POA: Diagnosis not present

## 2024-02-08 DIAGNOSIS — J9811 Atelectasis: Secondary | ICD-10-CM | POA: Diagnosis not present

## 2024-02-08 DIAGNOSIS — D631 Anemia in chronic kidney disease: Secondary | ICD-10-CM | POA: Diagnosis not present

## 2024-02-08 DIAGNOSIS — Z885 Allergy status to narcotic agent status: Secondary | ICD-10-CM | POA: Diagnosis not present

## 2024-02-10 DIAGNOSIS — Z992 Dependence on renal dialysis: Secondary | ICD-10-CM | POA: Diagnosis not present

## 2024-02-13 DIAGNOSIS — N186 End stage renal disease: Secondary | ICD-10-CM | POA: Diagnosis not present

## 2024-02-15 DIAGNOSIS — Z992 Dependence on renal dialysis: Secondary | ICD-10-CM | POA: Diagnosis not present

## 2024-02-20 DIAGNOSIS — Z992 Dependence on renal dialysis: Secondary | ICD-10-CM | POA: Diagnosis not present

## 2024-02-22 DIAGNOSIS — N186 End stage renal disease: Secondary | ICD-10-CM | POA: Diagnosis not present

## 2024-02-27 ENCOUNTER — Ambulatory Visit: Admitting: Internal Medicine

## 2024-02-27 ENCOUNTER — Encounter: Payer: Self-pay | Admitting: Internal Medicine

## 2024-02-27 VITALS — BP 128/74 | HR 91 | Temp 98.1°F | Ht 69.0 in | Wt 201.0 lb

## 2024-02-27 DIAGNOSIS — U071 COVID-19: Secondary | ICD-10-CM

## 2024-02-27 DIAGNOSIS — Z8679 Personal history of other diseases of the circulatory system: Secondary | ICD-10-CM | POA: Diagnosis not present

## 2024-02-27 LAB — POC COVID19/FLU A&B COMBO
Covid Antigen, POC: POSITIVE — AB
Influenza A Antigen, POC: NEGATIVE
Influenza B Antigen, POC: NEGATIVE

## 2024-02-27 MED ORDER — ONDANSETRON HCL 4 MG PO TABS: 4.0000 mg | ORAL_TABLET | Freq: Three times a day (TID) | ORAL | 0 refills | Status: DC | PRN

## 2024-02-27 NOTE — Assessment & Plan Note (Signed)
 Collapsed in [place] while on vacation. EMS defibrillated in the field and he was admitted to hospital in [place], [place] Discharge about 2 weeks ago - has not yet followed up with his cardiologist here. No records found.

## 2024-02-27 NOTE — Progress Notes (Signed)
 Date:  02/27/2024   Name:  Celestine [facility]ie [facility]   DOB:  [birth year]   MRN:  [id number]   Chief Complaint: Cough  Cough This is a new problem. The current episode started in the past 7 days. The problem has been unchanged. The problem occurs every few minutes. The cough is Non-productive. Associated symptoms include a fever, headaches and myalgias. Pertinent negatives include no chest pain or wheezing.  Nausea - very little food intake and some fluids.  No vomiting but severe nausea and anorexia.  He missed HD today. Ventricular tachycardia - defibrillated in [place] while there on vacation.  Admitted and monitored then discharged home.  He has not reached out to his cardiologist.  He and his wife are discussing palliative care or Hospice as an option going forward.    Review of Systems  Constitutional:  Positive for appetite change and fever.  Respiratory:  Positive for cough. Negative for wheezing.   Cardiovascular:  Negative for chest pain and palpitations.  Gastrointestinal:  Positive for abdominal pain and nausea. Negative for diarrhea and vomiting.  Musculoskeletal:  Positive for myalgias.  Neurological:  Positive for headaches.  Psychiatric/Behavioral:  Positive for sleep disturbance. Negative for dysphoric mood. The patient is not nervous/anxious.      Lab Results  Component Value Date   NA 135 11/08/2023   K 4.8 11/08/2023   CO2 25 11/08/2023   GLUCOSE 91 11/08/2023   BUN 41 (H) 11/08/2023   CREATININE 5.86 (H) 11/08/2023   CALCIUM  8.9 11/08/2023   EGFR 14 (L) 05/19/2022   GFRNONAA 9 (L) 11/08/2023   Lab Results  Component Value Date   CHOL 127 07/19/2023   HDL 49 07/19/2023   LDLCALC 65 07/19/2023   TRIG 59 07/19/2023   CHOLHDL 2.6 07/19/2023   Lab Results  Component Value Date   TSH 4.523 (H) 11/07/2023   Lab Results  Component Value Date   HGBA1C 4.7 (L) 08/06/2023   Lab Results  Component Value Date   WBC 4.7 12/21/2023   HGB 10.2 (L) 12/21/2023   HCT 30.6  (L) 12/21/2023   MCV 89.7 12/21/2023   PLT 115 (L) 12/21/2023   Lab Results  Component Value Date   ALT 28 11/06/2023   AST 44 (H) 11/06/2023   ALKPHOS 167 (H) 11/06/2023   BILITOT 1.5 (H) 11/06/2023   Lab Results  Component Value Date   VD25OH 83 09/06/2022     Patient Active Problem List   Diagnosis Date Noted   Hx of ventricular tachycardia 02/27/2024   Abnormal EKG 11/08/2023   Tachyarrhythmia 11/06/2023   Dyslipidemia 09/30/2023   ESRD (end stage renal disease) (HCC) 09/30/2023   Non-sustained ventricular tachycardia (HCC) 09/29/2023   Hypothyroidism 09/29/2023   Tremor of both hands 09/05/2023   Pruritus due to systemic disorder 09/05/2023   Hypotension 08/07/2023   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   HFrEF (heart failure with reduced ejection fraction) (HCC) 07/19/2023   Muscle tension headache 11/15/2022   Acquired thrombophilia 11/10/2022   ESRD (end stage renal disease) on dialysis (HCC) 09/16/2022   Physical deconditioning 09/16/2022   Bilateral leg edema 09/13/2022   OSA (obstructive sleep apnea) 07/22/2022   Chronic systolic CHF (congestive heart failure) (HCC) 07/15/2022   Typical atrial flutter (HCC) 07/15/2022   Acquired hypothyroidism 05/28/2022   GERD without esophagitis 05/28/2022   Essential hypertension 05/05/2022   Prediabetes 04/06/2022   Spinal stenosis of lumbar region without neurogenic claudication 04/06/2022   Weakness of both lower  extremities 04/06/2022   Upper airway cough syndrome 10/01/2021   Hyperkalemia 01/16/2020   Bursitis of hip 12/18/2018   Paroxysmal atrial fibrillation (HCC) 06/10/2018   Atherosclerosis of native arteries of extremity with intermittent claudication 06/25/2016   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   Coronary artery disease involving native coronary artery with angina pectoris    Allergic rhinitis 08/19/2015   Anemia 10/04/2014   AA (aortic aneurysm) 10/04/2014   Gouty arthropathy  10/04/2014   Basal cell papilloma 10/04/2014   Unspecified osteoarthritis, unspecified site 10/04/2014   Thoracic aortic aneurysm (TAA) 10/04/2014   Atherosclerotic heart disease of native coronary artery without angina pectoris 10/04/2014   Benign prostatic hyperplasia with lower urinary tract symptoms 10/24/2012   DOE (dyspnea on exertion) 11/12/2009   Renal artery stenosis     Mixed hyperlipidemia 09/04/2008   History of redo bypass grafting 09/04/2008    Allergies  Allergen Reactions   Predicort [Prednisolone] Other (See Comments)    Stomach pain   Ciprofloxacin Other (See Comments)    GI upset   Hydrochlorothiazide Other (See Comments)    Dehydration   Hydrocodone Nausea Only and Other (See Comments)    Stomach upset    Hydrocodone-Acetaminophen  Nausea Only    Stomach upset   Sulfa Antibiotics Other (See Comments)    Cannot recall   Penicillins Hives, Rash and Other (See Comments)    Has patient had a PCN reaction causing immediate rash, facial/tongue/throat swelling, SOB or lightheadedness with hypotension: YES  Has patient had a PCN reaction causing severe rash involving mucus membranes or skin necrosis: NO  Has patient had a PCN reaction that required hospitalization NO  Has patient had a PCN reaction occurring within the last 10 years:NO  If all of the above answers are NO, then may proceed with Cephalosporin use.  Has patient had a PCN reaction causing immediate rash, facial/tongue/throat swelling, SOB or lightheadedness with hypotension: YES Has patient had a PCN reaction causing severe rash involving mucus membranes or skin necrosis: NO Has patient had a PCN reaction that required hospitalization NO Has patient had a PCN reaction occurring within the last 10 years:NO If all of the above answers are NO, then may proceed with Cephalosporin use.    Past Surgical History:  Procedure Laterality Date   CARDIAC CATHETERIZATION  several   CARDIAC CATHETERIZATION  N/A 12/13/2014   Procedure: Left Heart Cath and Coronary Angiography;  Surgeon: Candyce GORMAN Reek, MD;  Location: [facility] INVASIVE CV LAB;  Service: Cardiovascular;  Laterality: N/A;   CARDIAC CATHETERIZATION  12/13/2014   Procedure: Coronary Stent Intervention;  Surgeon: Candyce GORMAN Reek, MD;  Location: [facility] INVASIVE CV LAB;  Service: Cardiovascular;;   CARDIAC CATHETERIZATION N/A 10/07/2015   Procedure: Left Heart Cath and Cors/Grafts Angiography;  Surgeon: Lonni JONETTA Cash, MD;  Location: [facility] INVASIVE CV LAB;  Service: Cardiovascular;  Laterality: N/A;   CARDIAC CATHETERIZATION N/A 10/07/2015   Procedure: Coronary Stent Intervention;  Surgeon: Lonni JONETTA Cash, MD;  Location: [facility] INVASIVE CV LAB;  Service: Cardiovascular;  Laterality: N/A;   CARDIOVERSION N/A 05/25/2022   Procedure: CARDIOVERSION;  Surgeon: Sheena Pugh, DO;  Location: [facility] ENDOSCOPY;  Service: Cardiovascular;  Laterality: N/A;   CATARACT EXTRACTION W/PHACO Left 10/21/2021   Procedure: CATARACT EXTRACTION PHACO AND INTRAOCULAR LENS PLACEMENT (IOC) LEFT 3.94 00:33.7;  Surgeon: Myrna Adine Anes, MD;  Location: [facility];  Service: Ophthalmology;  Laterality: Left;   CATARACT EXTRACTION W/PHACO Right 11/09/2021   Procedure: CATARACT EXTRACTION PHACO AND INTRAOCULAR LENS PLACEMENT (  IOC) RIGHT;  Surgeon: Myrna Adine Anes, MD;  Location: [facility];  Service: Ophthalmology;  Laterality: Right;  3.59 0:29.4   CORONARY ANGIOPLASTY  several   CORONARY ANGIOPLASTY WITH STENT PLACEMENT  2005; 12/13/2014   2; 1   CORONARY ARTERY BYPASS GRAFT  05/31/1994   CABG X5   CORONARY ARTERY BYPASS GRAFT  07/29/2005   CABG X3   DIALYSIS/PERMA CATHETER INSERTION N/A 09/10/2022   Procedure: DIALYSIS/PERMA CATHETER INSERTION;  Surgeon: Jama Cordella MATSU, MD;  Location: [facility] INVASIVE CV LAB;  Service: Cardiovascular;  Laterality: N/A;   ESOPHAGOGASTRODUODENOSCOPY (EGD) WITH ESOPHAGEAL DILATION  05/31/1998   GREEN  LIGHT LASER TURP (TRANSURETHRAL RESECTION OF PROSTATE  01/30/1999   not cancerous   HERNIA REPAIR     LAPAROSCOPIC CHOLECYSTECTOMY     LEFT HEART CATH AND CORS/GRAFTS ANGIOGRAPHY N/A 07/28/2017   Procedure: LEFT HEART CATH AND CORS/GRAFTS ANGIOGRAPHY;  Surgeon: Burnard Debby LABOR, MD;  Location: [facility] INVASIVE CV LAB;  Service: Cardiovascular;  Laterality: N/A;   LUNG SURGERY  05/31/1994   S/P CABG, had to put staple in lung after it had collapsed   MINOR REMOVAL OF PERITONEAL DIALYSIS CATHETER  10/2022   UMBILICAL HERNIA REPAIR     w/chole    Social History   Tobacco Use   Smoking status: Former    Current packs/day: 0.00    Average packs/day: 3.0 packs/day for 20.0 years (60.0 ttl pk-yrs)    Types: Cigarettes    Start date: 07/25/1966    Quit date: 07/25/1986    Years since quitting: 37.6   Smokeless tobacco: Never  Vaping Use   Vaping status: Never Used  Substance Use Topics   Alcohol use: No    Alcohol/week: 0.0 standard drinks of alcohol   Drug use: No     Medication list has been reviewed and updated.  Current Meds  Medication Sig   allopurinol  (ZYLOPRIM ) 100 MG tablet TAKE 1 TABLET BY MOUTH EVERYDAY AT BEDTIME   amiodarone  (PACERONE ) 200 MG tablet Take 2 tablets (400 mg total) by mouth daily.   aspirin  EC 81 MG tablet Take 1 tablet (81 mg total) by mouth daily. Swallow whole.   atorvastatin  (LIPITOR) 40 MG tablet TAKE 1 TABLET BY MOUTH EVERYDAY AT BEDTIME   Cholecalciferol  (VITAMIN D -3) 125 MCG (5000 UT) TABS Take 2,000 Units by mouth daily.    doxepin  (SINEQUAN ) 10 MG capsule Take 1 capsule (10 mg total) by mouth at bedtime. (Patient taking differently: Take 10 mg by mouth at bedtime as needed.)   furosemide  (LASIX ) 20 MG tablet Take 60 mg by mouth daily as needed for edema.   gabapentin  (NEURONTIN ) 100 MG capsule Take 100 mg by mouth at bedtime.   levothyroxine  (SYNTHROID ) 75 MCG tablet TAKE 1 TABLET BY MOUTH EVERY DAY BEFORE BREAKFAST   midodrine  (PROAMATINE ) 5 MG  tablet Take 1 tablet (5 mg total) by mouth 3 (three) times daily with meals.   Multiple Vitamin (MULTIVITAMIN PO) Take 1 tablet by mouth daily.   nitroGLYCERIN  (NITROSTAT ) 0.4 MG SL tablet Place 0.4 mg under the tongue every 5 (five) minutes as needed for chest pain (Up to 3 times).   ondansetron  (ZOFRAN ) 4 MG tablet Take 1 tablet (4 mg total) by mouth every 8 (eight) hours as needed for nausea or vomiting.   pantoprazole  (PROTONIX ) 40 MG tablet TAKE 1 TABLET (40 MG TOTAL) BY MOUTH 2 (TWO) TIMES DAILY. HOME MED.   senna (SENOKOT) 8.6 MG tablet Take 1 tablet by mouth as  needed for constipation.       10/07/2023    2:07 PM 08/15/2023    1:44 PM 07/26/2023    1:58 PM 07/19/2023    9:37 AM  GAD 7 : Generalized Anxiety Score  Nervous, Anxious, on Edge 3 2 0 0  Control/stop worrying 1 0 0 1  Worry too much - different things 1 0 0 1  Trouble relaxing 1 0 0 1  Restless 1 2 0 1  Easily annoyed or irritable 1 2 0 0  Afraid - awful might happen 1 2 0 0  Total GAD 7 Score 9 8 0 4  Anxiety Difficulty Somewhat difficult  Not difficult at all Not difficult at all       10/07/2023    2:07 PM 08/15/2023    1:44 PM 07/26/2023    1:58 PM  Depression screen PHQ 2/9  Decreased Interest 3 0 0  Down, Depressed, Hopeless 0 0 0  PHQ - 2 Score 3 0 0  Altered sleeping 1 0 0  Tired, decreased energy 3 3 0  Change in appetite 0 0 0  Feeling bad or failure about yourself  0 0 0  Trouble concentrating 0 0 0  Moving slowly or fidgety/restless 3 3 0  Suicidal thoughts 0 0 0  PHQ-9 Score 10 6 0  Difficult doing work/chores Extremely dIfficult Somewhat difficult Not difficult at all    BP Readings from Last 3 Encounters:  02/27/24 128/74  12/21/23 128/74  11/22/23 120/64    Physical Exam Constitutional:      Appearance: He is ill-appearing.  Cardiovascular:     Rate and Rhythm: Normal rate. Rhythm irregular.  Pulmonary:     Effort: Pulmonary effort is normal. No respiratory distress.     Breath  sounds: No stridor. No wheezing or rhonchi.  Abdominal:     Palpations: Abdomen is soft.     Tenderness: There is no abdominal tenderness. There is no guarding.  Musculoskeletal:        General: Swelling (trace ankle edema) present.     Cervical back: Normal range of motion.  Lymphadenopathy:     Cervical: No cervical adenopathy.  Skin:    General: Skin is warm and dry.  Neurological:     Mental Status: He is alert.     Wt Readings from Last 3 Encounters:  02/27/24 201 lb (91.2 kg)  12/21/23 202 lb (91.6 kg)  11/22/23 205 lb 6 oz (93.2 kg)    BP 128/74   Pulse 91   Temp 98.1 F (36.7 C) (Oral)   Ht 5' 9 (1.753 m)   Wt 201 lb (91.2 kg)   SpO2 97%   BMI 29.68 kg/m   Assessment and Plan:  Problem List Items Addressed This Visit       Unprioritized   Hx of ventricular tachycardia   Collapsed in [place] while on vacation. EMS defibrillated in the field and he was admitted to hospital in [place], [place] Discharge about 2 weeks ago - has not yet followed up with his cardiologist here. No records found.      Other Visit Diagnoses       COVID-19    -  Primary   acute onset - minimal SOB cough relieved by Robitussin fever relieved by Tylenol  will give zofran  for nausea   Relevant Medications   ondansetron  (ZOFRAN ) 4 MG tablet   Other Relevant Orders   POC Covid19/Flu A&B Antigen (Completed)  No follow-ups on file.    Leita HILARIO Adie, MD [facility] Primary [facility] Medicine [place]

## 2024-02-28 DIAGNOSIS — N186 End stage renal disease: Secondary | ICD-10-CM | POA: Diagnosis not present

## 2024-02-28 DIAGNOSIS — Z992 Dependence on renal dialysis: Secondary | ICD-10-CM | POA: Diagnosis not present

## 2024-03-02 ENCOUNTER — Other Ambulatory Visit: Payer: Self-pay | Admitting: Internal Medicine

## 2024-03-02 ENCOUNTER — Encounter: Payer: Self-pay | Admitting: Internal Medicine

## 2024-03-02 DIAGNOSIS — U071 COVID-19: Secondary | ICD-10-CM

## 2024-03-02 MED ORDER — PROMETHAZINE-DM 6.25-15 MG/5ML PO SYRP: 5.0000 mL | ORAL_SOLUTION | Freq: Two times a day (BID) | ORAL | 0 refills | Status: AC | PRN

## 2024-03-02 NOTE — Telephone Encounter (Signed)
 Please review.  KP

## 2024-03-02 NOTE — Progress Notes (Unsigned)
 Date:  03/02/2024   Name:  Ronald [name]   DOB:  1945/09/06   MRN:  [id number]   Chief Complaint: No chief complaint on file.  HPI  Review of Systems   Lab Results  Component Value Date   NA 135 11/08/2023   K 4.8 11/08/2023   CO2 25 11/08/2023   GLUCOSE 91 11/08/2023   BUN 41 (H) 11/08/2023   CREATININE 5.86 (H) 11/08/2023   CALCIUM  8.9 11/08/2023   EGFR 14 (L) 05/19/2022   GFRNONAA 9 (L) 11/08/2023   Lab Results  Component Value Date   CHOL 127 07/19/2023   HDL 49 07/19/2023   LDLCALC 65 07/19/2023   TRIG 59 07/19/2023   CHOLHDL 2.6 07/19/2023   Lab Results  Component Value Date   TSH 4.523 (H) 11/07/2023   Lab Results  Component Value Date   HGBA1C 4.7 (L) 08/06/2023   Lab Results  Component Value Date   WBC 4.7 12/21/2023   HGB 10.2 (L) 12/21/2023   HCT 30.6 (L) 12/21/2023   MCV 89.7 12/21/2023   PLT 115 (L) 12/21/2023   Lab Results  Component Value Date   ALT 28 11/06/2023   AST 44 (H) 11/06/2023   ALKPHOS 167 (H) 11/06/2023   BILITOT 1.5 (H) 11/06/2023   Lab Results  Component Value Date   VD25OH 83 09/06/2022     Patient Active Problem List   Diagnosis Date Noted   Hx of ventricular tachycardia 02/27/2024   Abnormal EKG 11/08/2023   Tachyarrhythmia 11/06/2023   Dyslipidemia 09/30/2023   ESRD (end stage renal disease) (HCC) 09/30/2023   Non-sustained ventricular tachycardia (HCC) 09/29/2023   Hypothyroidism 09/29/2023   Tremor of both hands 09/05/2023   Pruritus due to systemic disorder 09/05/2023   Hypotension 08/07/2023   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   HFrEF (heart failure with reduced ejection fraction) (HCC) 07/19/2023   Muscle tension headache 11/15/2022   Acquired thrombophilia 11/10/2022   ESRD (end stage renal disease) on dialysis (HCC) 09/16/2022   Physical deconditioning 09/16/2022   Bilateral leg edema 09/13/2022   OSA (obstructive sleep apnea) 07/22/2022   Chronic systolic CHF (congestive heart  failure) (HCC) 07/15/2022   Typical atrial flutter (HCC) 07/15/2022   Acquired hypothyroidism 05/28/2022   GERD without esophagitis 05/28/2022   Essential hypertension 05/05/2022   Prediabetes 04/06/2022   Spinal stenosis of lumbar region without neurogenic claudication 04/06/2022   Weakness of both lower extremities 04/06/2022   Upper airway cough syndrome 10/01/2021   Hyperkalemia 01/16/2020   Bursitis of hip 12/18/2018   Paroxysmal atrial fibrillation (HCC) 06/10/2018   Atherosclerosis of native arteries of extremity with intermittent claudication 06/25/2016   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   Coronary artery disease involving native coronary artery with angina pectoris    Allergic rhinitis 08/19/2015   Anemia 10/04/2014   AA (aortic aneurysm) 10/04/2014   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   Basal cell papilloma 10/04/2014   Unspecified osteoarthritis, unspecified site 10/04/2014   Thoracic aortic aneurysm (TAA) 10/04/2014   Atherosclerotic heart disease of native coronary artery without angina pectoris 10/04/2014   Benign prostatic hyperplasia with lower urinary tract symptoms 10/24/2012   DOE (dyspnea on exertion) 11/12/2009   Renal artery stenosis     Mixed hyperlipidemia 09/04/2008   History of redo bypass grafting 09/04/2008    Allergies  Allergen Reactions   Predicort [Prednisolone] Other (See Comments)    Stomach pain   Ciprofloxacin Other (See Comments)    GI upset   Hydrochlorothiazide  Other (See Comments)    Dehydration   Hydrocodone Nausea Only and Other (See Comments)    Stomach upset    Hydrocodone-Acetaminophen  Nausea Only    Stomach upset   Sulfa Antibiotics Other (See Comments)    Cannot recall   Penicillins Hives, Rash and Other (See Comments)    Has patient had a PCN reaction causing immediate rash, facial/tongue/throat swelling, SOB or lightheadedness with hypotension: YES  Has patient had a PCN reaction causing severe rash involving mucus  membranes or skin necrosis: NO  Has patient had a PCN reaction that required hospitalization NO  Has patient had a PCN reaction occurring within the last 10 years:NO  If all of the above answers are NO, then may proceed with Cephalosporin use.  Has patient had a PCN reaction causing immediate rash, facial/tongue/throat swelling, SOB or lightheadedness with hypotension: YES Has patient had a PCN reaction causing severe rash involving mucus membranes or skin necrosis: NO Has patient had a PCN reaction that required hospitalization NO Has patient had a PCN reaction occurring within the last 10 years:NO If all of the above answers are NO, then may proceed with Cephalosporin use.    Past Surgical History:  Procedure Laterality Date   CARDIAC CATHETERIZATION  several   CARDIAC CATHETERIZATION N/A 12/13/2014   Procedure: Left Heart Cath and Coronary Angiography;  Surgeon: Candyce GORMAN Reek, MD;  Location: [name] INVASIVE CV LAB;  Service: Cardiovascular;  Laterality: N/A;   CARDIAC CATHETERIZATION  12/13/2014   Procedure: Coronary Stent Intervention;  Surgeon: Candyce GORMAN Reek, MD;  Location: [facility] INVASIVE CV LAB;  Service: Cardiovascular;;   CARDIAC CATHETERIZATION N/A 10/07/2015   Procedure: Left Heart Cath and Cors/Grafts Angiography;  Surgeon: Lonni JONETTA Cash, MD;  Location: [name] INVASIVE CV LAB;  Service: Cardiovascular;  Laterality: N/A;   CARDIAC CATHETERIZATION N/A 10/07/2015   Procedure: Coronary Stent Intervention;  Surgeon: Lonni JONETTA Cash, MD;  Location: [facility] INVASIVE CV LAB;  Service: Cardiovascular;  Laterality: N/A;   CARDIOVERSION N/A 05/25/2022   Procedure: CARDIOVERSION;  Surgeon: Sheena Pugh, DO;  [facility] ENDOSCOPY;  Service: Cardiovascular;  Laterality: N/A;   CATARACT EXTRACTION W/PHACO Left 10/21/2021   Procedure: CATARACT EXTRACTION PHACO AND INTRAOCULAR LENS PLACEMENT (IOC) LEFT 3.94 00:33.7;  Surgeon: Myrna Adine Anes, MD;  Location: [facility] SURGERY  CNTR;  Service: Ophthalmology;  Laterality: Left;   CATARACT EXTRACTION W/PHACO Right 11/09/2021   Procedure: CATARACT EXTRACTION PHACO AND INTRAOCULAR LENS PLACEMENT (IOC) RIGHT;  Surgeon: Myrna Adine Anes, MD;  Location: [facility] SURGERY CNTR;  Service: Ophthalmology;  Laterality: Right;  3.59 0:29.4   CORONARY ANGIOPLASTY  several   CORONARY ANGIOPLASTY WITH STENT PLACEMENT  2005; 12/13/2014   2; 1   CORONARY ARTERY BYPASS GRAFT  05/31/1994   CABG X5   CORONARY ARTERY BYPASS GRAFT  07/29/2005   CABG X3   DIALYSIS/PERMA CATHETER INSERTION N/A 09/10/2022   Procedure: DIALYSIS/PERMA CATHETER INSERTION;  Surgeon: Jama Cordella MATSU, MD;  Location: [facility] INVASIVE CV LAB;  Service: Cardiovascular;  Laterality: N/A;   ESOPHAGOGASTRODUODENOSCOPY (EGD) WITH ESOPHAGEAL DILATION  05/31/1998   GREEN LIGHT LASER TURP (TRANSURETHRAL RESECTION OF PROSTATE  01/30/1999   not cancerous   HERNIA REPAIR     LAPAROSCOPIC CHOLECYSTECTOMY     LEFT HEART CATH AND CORS/GRAFTS ANGIOGRAPHY N/A 07/28/2017   Procedure: LEFT HEART CATH AND CORS/GRAFTS ANGIOGRAPHY;  Surgeon: Burnard Debby LABOR, MD;  Location: [facility] INVASIVE CV LAB;  Service: Cardiovascular;  Laterality: N/A;   LUNG SURGERY  05/31/1994   S/P CABG,  had to put staple in lung after it had collapsed   MINOR REMOVAL OF PERITONEAL DIALYSIS CATHETER  10/2022   UMBILICAL HERNIA REPAIR     w/chole    Social History   Tobacco Use   Smoking status: Former    Current packs/day: 0.00    Average packs/day: 3.0 packs/day for 20.0 years (60.0 ttl pk-yrs)    Types: Cigarettes    Start date: 07/25/1966    Quit date: 07/25/1986    Years since quitting: 37.6   Smokeless tobacco: Never  Vaping Use   Vaping status: Never Used  Substance Use Topics   Alcohol use: No    Alcohol/week: 0.0 standard drinks of alcohol   Drug use: No     Medication list has been reviewed and updated.  No outpatient medications have been marked as taking for the 03/02/24  encounter (Orders Only) with [name], MD.       10/07/2023    2:07 PM 08/15/2023    1:44 PM 07/26/2023    1:58 PM 07/19/2023    9:37 AM  GAD 7 : Generalized Anxiety Score  Nervous, Anxious, on Edge 3 2 0 0  Control/stop worrying 1 0 0 1  Worry too much - different things 1 0 0 1  Trouble relaxing 1 0 0 1  Restless 1 2 0 1  Easily annoyed or irritable 1 2 0 0  Afraid - awful might happen 1 2 0 0  Total GAD 7 Score 9 8 0 4  Anxiety Difficulty Somewhat difficult  Not difficult at all Not difficult at all       10/07/2023    2:07 PM 08/15/2023    1:44 PM 07/26/2023    1:58 PM  Depression screen PHQ 2/9  Decreased Interest 3 0 0  Down, Depressed, Hopeless 0 0 0  PHQ - 2 Score 3 0 0  Altered sleeping 1 0 0  Tired, decreased energy 3 3 0  Change in appetite 0 0 0  Feeling bad or failure about yourself  0 0 0  Trouble concentrating 0 0 0  Moving slowly or fidgety/restless 3 3 0  Suicidal thoughts 0 0 0  PHQ-9 Score 10 6 0  Difficult doing work/chores Extremely dIfficult Somewhat difficult Not difficult at all    BP Readings from Last 3 Encounters:  02/27/24 128/74  12/21/23 128/74  11/22/23 120/64    Physical Exam  Wt Readings from Last 3 Encounters:  02/27/24 201 lb (91.2 kg)  12/21/23 202 lb (91.6 kg)  11/22/23 205 lb 6 oz (93.2 kg)    There were no vitals taken for this visit.  Assessment and Plan:  Problem List Items Addressed This Visit   None   No follow-ups on file.    Leita HILARIO Justus, MD [name] [place]

## 2024-03-09 ENCOUNTER — Other Ambulatory Visit: Payer: Self-pay

## 2024-03-09 MED ORDER — AZITHROMYCIN 250 MG PO TABS: ORAL_TABLET | ORAL | 0 refills | Status: AC

## 2024-03-16 ENCOUNTER — Other Ambulatory Visit: Payer: Self-pay | Admitting: Internal Medicine

## 2024-03-16 DIAGNOSIS — I2510 Atherosclerotic heart disease of native coronary artery without angina pectoris: Secondary | ICD-10-CM

## 2024-03-19 ENCOUNTER — Other Ambulatory Visit: Payer: Self-pay | Admitting: Internal Medicine

## 2024-03-19 DIAGNOSIS — I2510 Atherosclerotic heart disease of native coronary artery without angina pectoris: Secondary | ICD-10-CM

## 2024-03-19 MED ORDER — ATORVASTATIN CALCIUM 40 MG PO TABS: 40.0000 mg | ORAL_TABLET | Freq: Every day | ORAL | 1 refills | Status: DC

## 2024-03-19 NOTE — Progress Notes (Unsigned)
 Date:  03/19/2024   Name:  Ronald [name] [facility]   DOB:  Aug 25, 1945   MRN:  [id number]   Chief Complaint: No chief complaint on file.  HPI  Review of Systems   Lab Results  Component Value Date   NA 135 11/08/2023   K 4.8 11/08/2023   CO2 25 11/08/2023   GLUCOSE 91 11/08/2023   BUN 41 (H) 11/08/2023   CREATININE 5.86 (H) 11/08/2023   CALCIUM  8.9 11/08/2023   EGFR 14 (L) 05/19/2022   GFRNONAA 9 (L) 11/08/2023   Lab Results  Component Value Date   CHOL 127 07/19/2023   HDL 49 07/19/2023   LDLCALC 65 07/19/2023   TRIG 59 07/19/2023   CHOLHDL 2.6 07/19/2023   Lab Results  Component Value Date   TSH 4.523 (H) 11/07/2023   Lab Results  Component Value Date   HGBA1C 4.7 (L) 08/06/2023   Lab Results  Component Value Date   WBC 4.7 12/21/2023   HGB 10.2 (L) 12/21/2023   HCT 30.6 (L) 12/21/2023   MCV 89.7 12/21/2023   PLT 115 (L) 12/21/2023   Lab Results  Component Value Date   ALT 28 11/06/2023   AST 44 (H) 11/06/2023   ALKPHOS 167 (H) 11/06/2023   BILITOT 1.5 (H) 11/06/2023   Lab Results  Component Value Date   VD25OH 83 09/06/2022     Patient Active Problem List   Diagnosis Date Noted   Hx of ventricular tachycardia 02/27/2024   Abnormal EKG 11/08/2023   Tachyarrhythmia 11/06/2023   Dyslipidemia 09/30/2023   ESRD (end stage renal disease) (HCC) 09/30/2023   Non-sustained ventricular tachycardia (HCC) 09/29/2023   Hypothyroidism 09/29/2023   Tremor of both hands 09/05/2023   Pruritus due to systemic disorder 09/05/2023   Hypotension 08/07/2023   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   HFrEF (heart failure with reduced ejection fraction) (HCC) 07/19/2023   Muscle tension headache 11/15/2022   Acquired thrombophilia 11/10/2022   ESRD (end stage renal disease) on dialysis (HCC) 09/16/2022   Physical deconditioning 09/16/2022   Bilateral leg edema 09/13/2022   OSA (obstructive sleep apnea) 07/22/2022   Chronic systolic CHF (congestive heart  failure) (HCC) 07/15/2022   Typical atrial flutter (HCC) 07/15/2022   Acquired hypothyroidism 05/28/2022   GERD without esophagitis 05/28/2022   Essential hypertension 05/05/2022   Prediabetes 04/06/2022   Spinal stenosis of lumbar region without neurogenic claudication 04/06/2022   Weakness of both lower extremities 04/06/2022   Upper airway cough syndrome 10/01/2021   Hyperkalemia 01/16/2020   Bursitis of hip 12/18/2018   Paroxysmal atrial fibrillation (HCC) 06/10/2018   Atherosclerosis of native arteries of extremity with intermittent claudication 06/25/2016   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   Coronary artery disease involving native coronary artery with angina pectoris    Allergic rhinitis 08/19/2015   Anemia 10/04/2014   AA (aortic aneurysm) 10/04/2014   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   Basal cell papilloma 10/04/2014   Unspecified osteoarthritis, unspecified site 10/04/2014   Thoracic aortic aneurysm (TAA) 10/04/2014   Atherosclerotic heart disease of native coronary artery without angina pectoris 10/04/2014   Benign prostatic hyperplasia with lower urinary tract symptoms 10/24/2012   DOE (dyspnea on exertion) 11/12/2009   Renal artery stenosis     Mixed hyperlipidemia 09/04/2008   History of redo bypass grafting 09/04/2008    Allergies  Allergen Reactions   Predicort [Prednisolone] Other (See Comments)    Stomach pain   Ciprofloxacin Other (See Comments)    GI upset   Hydrochlorothiazide  Other (See Comments)    Dehydration   Hydrocodone Nausea Only and Other (See Comments)    Stomach upset    Hydrocodone-Acetaminophen  Nausea Only    Stomach upset   Sulfa Antibiotics Other (See Comments)    Cannot recall   Penicillins Hives, Rash and Other (See Comments)    Has patient had a PCN reaction causing immediate rash, facial/tongue/throat swelling, SOB or lightheadedness with hypotension: YES  Has patient had a PCN reaction causing severe rash involving mucus  membranes or skin necrosis: NO  Has patient had a PCN reaction that required hospitalization NO  Has patient had a PCN reaction occurring within the last 10 years:NO  If all of the above answers are NO, then may proceed with Cephalosporin use.  Has patient had a PCN reaction causing immediate rash, facial/tongue/throat swelling, SOB or lightheadedness with hypotension: YES Has patient had a PCN reaction causing severe rash involving mucus membranes or skin necrosis: NO Has patient had a PCN reaction that required hospitalization NO Has patient had a PCN reaction occurring within the last 10 years:NO If all of the above answers are NO, then may proceed with Cephalosporin use.    Past Surgical History:  Procedure Laterality Date   CARDIAC CATHETERIZATION  several   CARDIAC CATHETERIZATION N/A 12/13/2014   Procedure: Left Heart Cath and Coronary Angiography;  Surgeon: Candyce GORMAN Reek, MD;  Location: [facility] INVASIVE CV LAB;  Service: Cardiovascular;  Laterality: N/A;   CARDIAC CATHETERIZATION  12/13/2014   Procedure: Coronary Stent Intervention;  Surgeon: Candyce GORMAN Reek, MD;  Location: [facility] INVASIVE CV LAB;  Service: Cardiovascular;;   CARDIAC CATHETERIZATION N/A 10/07/2015   Procedure: Left Heart Cath and Cors/Grafts Angiography;  Surgeon: Lonni JONETTA Cash, MD;  Location: [facility] INVASIVE CV LAB;  Service: Cardiovascular;  Laterality: N/A;   CARDIAC CATHETERIZATION N/A 10/07/2015   Procedure: Coronary Stent Intervention;  Surgeon: Lonni JONETTA Cash, MD;  Location: [facility] INVASIVE CV LAB;  Service: Cardiovascular;  Laterality: N/A;   CARDIOVERSION N/A 05/25/2022   Procedure: CARDIOVERSION;  Surgeon: Sheena Pugh, DO;  Location: [facility] ENDOSCOPY;  Service: Cardiovascular;  Laterality: N/A;   CATARACT EXTRACTION W/PHACO Left 10/21/2021   Procedure: CATARACT EXTRACTION PHACO AND INTRAOCULAR LENS PLACEMENT (IOC) LEFT 3.94 00:33.7;  Surgeon: Myrna Adine Anes, MD;  Location: [facility] SURGERY  CNTR;  Service: Ophthalmology;  Laterality: Left;   CATARACT EXTRACTION W/PHACO Right 11/09/2021   Procedure: CATARACT EXTRACTION PHACO AND INTRAOCULAR LENS PLACEMENT (IOC) RIGHT;  Surgeon: Myrna Adine Anes, MD;  Location: [facility] SURGERY CNTR;  Service: Ophthalmology;  Laterality: Right;  3.59 0:29.4   CORONARY ANGIOPLASTY  several   CORONARY ANGIOPLASTY WITH STENT PLACEMENT  2005; 12/13/2014   2; 1   CORONARY ARTERY BYPASS GRAFT  05/31/1994   CABG X5   CORONARY ARTERY BYPASS GRAFT  07/29/2005   CABG X3   DIALYSIS/PERMA CATHETER INSERTION N/A 09/10/2022   Procedure: DIALYSIS/PERMA CATHETER INSERTION;  Surgeon: Jama Cordella MATSU, MD;  Location: [facility] INVASIVE CV LAB;  Service: Cardiovascular;  Laterality: N/A;   ESOPHAGOGASTRODUODENOSCOPY (EGD) WITH ESOPHAGEAL DILATION  05/31/1998   GREEN LIGHT LASER TURP (TRANSURETHRAL RESECTION OF PROSTATE  01/30/1999   not cancerous   HERNIA REPAIR     LAPAROSCOPIC CHOLECYSTECTOMY     LEFT HEART CATH AND CORS/GRAFTS ANGIOGRAPHY N/A 07/28/2017   Procedure: LEFT HEART CATH AND CORS/GRAFTS ANGIOGRAPHY;  Surgeon: Burnard Debby LABOR, MD;  Location: [facility] INVASIVE CV LAB;  Service: Cardiovascular;  Laterality: N/A;   LUNG SURGERY  05/31/1994   S/P CABG,  had to put staple in lung after it had collapsed   MINOR REMOVAL OF PERITONEAL DIALYSIS CATHETER  10/2022   UMBILICAL HERNIA REPAIR     w/chole    Social History   Tobacco Use   Smoking status: Former    Current packs/day: 0.00    Average packs/day: 3.0 packs/day for 20.0 years (60.0 ttl pk-yrs)    Types: Cigarettes    Start date: 07/25/1966    Quit date: 07/25/1986    Years since quitting: 37.6   Smokeless tobacco: Never  Vaping Use   Vaping status: Never Used  Substance Use Topics   Alcohol use: No    Alcohol/week: 0.0 standard drinks of alcohol   Drug use: No     Medication list has been reviewed and updated.  No outpatient medications have been marked as taking for the 03/19/24  encounter (Orders Only) with [name], MD.       10/07/2023    2:07 PM 08/15/2023    1:44 PM 07/26/2023    1:58 PM 07/19/2023    9:37 AM  GAD 7 : Generalized Anxiety Score  Nervous, Anxious, on Edge 3 2 0 0  Control/stop worrying 1 0 0 1  Worry too much - different things 1 0 0 1  Trouble relaxing 1 0 0 1  Restless 1 2 0 1  Easily annoyed or irritable 1 2 0 0  Afraid - awful might happen 1 2 0 0  Total GAD 7 Score 9 8 0 4  Anxiety Difficulty Somewhat difficult  Not difficult at all Not difficult at all       10/07/2023    2:07 PM 08/15/2023    1:44 PM 07/26/2023    1:58 PM  Depression screen PHQ 2/9  Decreased Interest 3 0 0  Down, Depressed, Hopeless 0 0 0  PHQ - 2 Score 3 0 0  Altered sleeping 1 0 0  Tired, decreased energy 3 3 0  Change in appetite 0 0 0  Feeling bad or failure about yourself  0 0 0  Trouble concentrating 0 0 0  Moving slowly or fidgety/restless 3 3 0  Suicidal thoughts 0 0 0  PHQ-9 Score 10 6 0  Difficult doing work/chores Extremely dIfficult Somewhat difficult Not difficult at all    BP Readings from Last 3 Encounters:  02/27/24 128/74  12/21/23 128/74  11/22/23 120/64    Physical Exam  Wt Readings from Last 3 Encounters:  02/27/24 201 lb (91.2 kg)  12/21/23 202 lb (91.6 kg)  11/22/23 205 lb 6 oz (93.2 kg)    There were no vitals taken for this visit.  Assessment and Plan:  Problem List Items Addressed This Visit   None   No follow-ups on file.    Leita HILARIO Justus, MD [facility] [place]

## 2024-03-19 NOTE — Telephone Encounter (Signed)
 Rx 10/14/23 #90 1RF- too soon Requested Prescriptions  Pending Prescriptions Disp Refills   atorvastatin  (LIPITOR) 40 MG tablet [Pharmacy Med Name: ATORVASTATIN  40 MG TABLET] 90 tablet 1    Sig: TAKE 1 TABLET BY MOUTH EVERYDAY AT BEDTIME     Cardiovascular:  Antilipid - Statins Failed - 03/19/2024  1:38 PM      Failed - Lipid Panel in normal range within the last 12 months    Cholesterol, Total  Date Value Ref Range Status  07/19/2023 127 100 - 199 mg/dL Final   LDL Chol Calc (NIH)  Date Value Ref Range Status  07/19/2023 65 0 - 99 mg/dL Final   HDL  Date Value Ref Range Status  07/19/2023 49 >39 mg/dL Final   Triglycerides  Date Value Ref Range Status  07/19/2023 59 0 - 149 mg/dL Final         Passed - Patient is not pregnant      Passed - Valid encounter within last 12 months    Recent Outpatient Visits           3 weeks ago COVID-19   [facility] at [facility], Leita DEL, MD   2 months ago Generalized abdominal pain   [place] [facility] at [facility], Leita DEL, MD   4 months ago Pruritus due to systemic disorder   [facility] at [facility], Leita DEL, MD   5 months ago Spinal stenosis of lumbar region without neurogenic claudication   [facility] at [facility], Leita DEL, MD   6 months ago Pruritus due to systemic disorder   [facility] at [facility], Leita DEL, MD       Future Appointments             In 1 month McAlhany, Lonni BIRCH, MD [facility] at [facility] [employer], [employer]

## 2024-03-19 NOTE — Telephone Encounter (Signed)
 PT response.  JM

## 2024-03-19 NOTE — Telephone Encounter (Signed)
 Please review and advise.  JM

## 2024-03-20 ENCOUNTER — Encounter: Payer: Self-pay | Admitting: Internal Medicine

## 2024-03-20 ENCOUNTER — Ambulatory Visit (INDEPENDENT_AMBULATORY_CARE_PROVIDER_SITE_OTHER): Admitting: Internal Medicine

## 2024-03-20 VITALS — BP 90/60 | HR 82 | Temp 97.9°F | Ht 69.0 in | Wt 198.0 lb

## 2024-03-20 DIAGNOSIS — M109 Gout, unspecified: Secondary | ICD-10-CM | POA: Diagnosis not present

## 2024-03-20 DIAGNOSIS — R053 Chronic cough: Secondary | ICD-10-CM | POA: Diagnosis not present

## 2024-03-20 DIAGNOSIS — R0982 Postnasal drip: Secondary | ICD-10-CM

## 2024-03-20 DIAGNOSIS — U099 Post covid-19 condition, unspecified: Secondary | ICD-10-CM | POA: Diagnosis not present

## 2024-03-20 DIAGNOSIS — I480 Paroxysmal atrial fibrillation: Secondary | ICD-10-CM

## 2024-03-20 DIAGNOSIS — H353221 Exudative age-related macular degeneration, left eye, with active choroidal neovascularization: Secondary | ICD-10-CM | POA: Insufficient documentation

## 2024-03-20 DIAGNOSIS — R1312 Dysphagia, oropharyngeal phase: Secondary | ICD-10-CM | POA: Insufficient documentation

## 2024-03-20 MED ORDER — ASPIRIN 81 MG PO TBEC: 81.0000 mg | DELAYED_RELEASE_TABLET | Freq: Every day | ORAL | 3 refills | Status: DC

## 2024-03-20 MED ORDER — ALLOPURINOL 100 MG PO TABS: ORAL_TABLET | ORAL | 1 refills | Status: DC

## 2024-03-20 NOTE — Assessment & Plan Note (Signed)
 I suspect this is positional due to kyphosis. He will try extending his neck with swallowing or adding to soft food like applesauce

## 2024-03-20 NOTE — Progress Notes (Signed)
 Date:  03/20/2024   Name:  Ronald [name] [facility]   DOB:  07/11/1945   MRN:  [id number]   Chief Complaint: Cough (Choking when coughing, and SOB at night.)  Cough This is a new problem. The current episode started 1 to 4 weeks ago. The problem has been gradually improving (started with Covid infection.  then went into bronchitis.  Completed second Zpak today.). The cough is Non-productive. Pertinent negatives include no chest pain, chills, fever, headaches, sore throat, shortness of breath or wheezing.  Trouble swallowing - no choking or regurgitation. Mostly trouble getting his pills down.  He has significant neck pain and kyphosis.  Most likely the problem is positional.  He has not tried extending his neck as much as possible.  He has not tried putting the pills in applesauce or pudding.  Review of Systems  Constitutional:  Negative for chills, fatigue and fever.  HENT:  Positive for trouble swallowing. Negative for sore throat.   Respiratory:  Positive for cough. Negative for chest tightness, shortness of breath and wheezing.   Cardiovascular:  Positive for leg swelling. Negative for chest pain.  Neurological:  Negative for dizziness and headaches.     Lab Results  Component Value Date   NA 135 11/08/2023   K 4.8 11/08/2023   CO2 25 11/08/2023   GLUCOSE 91 11/08/2023   BUN 41 (H) 11/08/2023   CREATININE 5.86 (H) 11/08/2023   CALCIUM  8.9 11/08/2023   EGFR 14 (L) 05/19/2022   GFRNONAA 9 (L) 11/08/2023   Lab Results  Component Value Date   CHOL 127 07/19/2023   HDL 49 07/19/2023   LDLCALC 65 07/19/2023   TRIG 59 07/19/2023   CHOLHDL 2.6 07/19/2023   Lab Results  Component Value Date   TSH 4.523 (H) 11/07/2023   Lab Results  Component Value Date   HGBA1C 4.7 (L) 08/06/2023   Lab Results  Component Value Date   WBC 4.7 12/21/2023   HGB 10.2 (L) 12/21/2023   HCT 30.6 (L) 12/21/2023   MCV 89.7 12/21/2023   PLT 115 (L) 12/21/2023   Lab Results  Component Value Date    ALT 28 11/06/2023   AST 44 (H) 11/06/2023   ALKPHOS 167 (H) 11/06/2023   BILITOT 1.5 (H) 11/06/2023   Lab Results  Component Value Date   VD25OH 83 09/06/2022     Patient Active Problem List   Diagnosis Date Noted   Oropharyngeal dysphagia 03/20/2024   Exudative age-related macular degeneration, left eye, with active choroidal neovascularization (HCC) 03/20/2024   Hx of ventricular tachycardia 02/27/2024   Abnormal EKG 11/08/2023   Tachyarrhythmia 11/06/2023   Dyslipidemia 09/30/2023   ESRD (end stage renal disease) (HCC) 09/30/2023   Non-sustained ventricular tachycardia (HCC) 09/29/2023   Hypothyroidism 09/29/2023   Tremor of both hands 09/05/2023   Pruritus due to systemic disorder 09/05/2023   Hypotension 08/07/2023   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   HFrEF (heart failure with reduced ejection fraction) (HCC) 07/19/2023   Muscle tension headache 11/15/2022   Acquired thrombophilia 11/10/2022   ESRD (end stage renal disease) on dialysis (HCC) 09/16/2022   Physical deconditioning 09/16/2022   Bilateral leg edema 09/13/2022   OSA (obstructive sleep apnea) 07/22/2022   Chronic systolic CHF (congestive heart failure) (HCC) 07/15/2022   Typical atrial flutter (HCC) 07/15/2022   Acquired hypothyroidism 05/28/2022   GERD without esophagitis 05/28/2022   Essential hypertension 05/05/2022   Prediabetes 04/06/2022   Spinal stenosis of lumbar region without neurogenic claudication 04/06/2022  Weakness of both lower extremities 04/06/2022   Upper airway cough syndrome 10/01/2021   Hyperkalemia 01/16/2020   Bursitis of hip 12/18/2018   Paroxysmal atrial fibrillation (HCC) 06/10/2018   Atherosclerosis of native arteries of extremity with intermittent claudication 06/25/2016   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   Coronary artery disease involving native coronary artery with angina pectoris    Allergic rhinitis 08/19/2015   Anemia 10/04/2014   AA (aortic  aneurysm) 10/04/2014   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   Basal cell papilloma 10/04/2014   Unspecified osteoarthritis, unspecified site 10/04/2014   Thoracic aortic aneurysm (TAA) 10/04/2014   Atherosclerotic heart disease of native coronary artery without angina pectoris 10/04/2014   Benign prostatic hyperplasia with lower urinary tract symptoms 10/24/2012   DOE (dyspnea on exertion) 11/12/2009   Renal artery stenosis     Mixed hyperlipidemia 09/04/2008   History of redo bypass grafting 09/04/2008    Allergies  Allergen Reactions   Predicort [Prednisolone] Other (See Comments)    Stomach pain   Ciprofloxacin Other (See Comments)    GI upset   Hydrochlorothiazide Other (See Comments)    Dehydration   Hydrocodone Nausea Only and Other (See Comments)    Stomach upset    Hydrocodone-Acetaminophen  Nausea Only    Stomach upset   Sulfa Antibiotics Other (See Comments)    Cannot recall   Penicillins Hives, Rash and Other (See Comments)    Has patient had a PCN reaction causing immediate rash, facial/tongue/throat swelling, SOB or lightheadedness with hypotension: YES  Has patient had a PCN reaction causing severe rash involving mucus membranes or skin necrosis: NO  Has patient had a PCN reaction that required hospitalization NO  Has patient had a PCN reaction occurring within the last 10 years:NO  If all of the above answers are NO, then may proceed with Cephalosporin use.  Has patient had a PCN reaction causing immediate rash, facial/tongue/throat swelling, SOB or lightheadedness with hypotension: YES Has patient had a PCN reaction causing severe rash involving mucus membranes or skin necrosis: NO Has patient had a PCN reaction that required hospitalization NO Has patient had a PCN reaction occurring within the last 10 years:NO If all of the above answers are NO, then may proceed with Cephalosporin use.    Past Surgical History:  Procedure Laterality Date   CARDIAC  CATHETERIZATION  several   CARDIAC CATHETERIZATION N/A 12/13/2014   Procedure: Left Heart Cath and Coronary Angiography;  Surgeon: Candyce GORMAN Reek, MD;  Location[facility] INVASIVE CV LAB;  Service: Cardiovascular;  Laterality: N/A;   CARDIAC CATHETERIZATION  12/13/2014   Procedure: Coronary Stent Intervention;  Surgeon: Candyce GORMAN Reek, MD;  Location: [facility] INVASIVE CV LAB;  Service: Cardiovascular;;   CARDIAC CATHETERIZATION N/A 10/07/2015   Procedure: Left Heart Cath and Cors/Grafts Angiography;  Surgeon: Lonni JONETTA Cash, MD;  Location: [facility] INVASIVE CV LAB;  Service: Cardiovascular;  Laterality: N/A;   CARDIAC CATHETERIZATION N/A 10/07/2015   Procedure: Coronary Stent Intervention;  Surgeon: Lonni JONETTA Cash, MD;  Location: [facility] INVASIVE CV LAB;  Service: Cardiovascular;  Laterality: N/A;   CARDIOVERSION N/A 05/25/2022   Procedure: CARDIOVERSION;  Surgeon: Sheena Pugh, DO;  Location: [facility] ENDOSCOPY;  Service: Cardiovascular;  Laterality: N/A;   CATARACT EXTRACTION W/PHACO Left 10/21/2021   Procedure: CATARACT EXTRACTION PHACO AND INTRAOCULAR LENS PLACEMENT (IOC) LEFT 3.94 00:33.7;  Surgeon: Myrna Adine Anes, MD;  Location: [facility];  Service: Ophthalmology;  Laterality: Left;   CATARACT EXTRACTION W/PHACO Right 11/09/2021   Procedure: CATARACT EXTRACTION PHACO  AND INTRAOCULAR LENS PLACEMENT (IOC) RIGHT;  Surgeon: Myrna Adine Anes, MD;  Location: [facility];  Service: Ophthalmology;  Laterality: Right;  3.59 0:29.4   CORONARY ANGIOPLASTY  several   CORONARY ANGIOPLASTY WITH STENT PLACEMENT  2005; 12/13/2014   2; 1   CORONARY ARTERY BYPASS GRAFT  05/31/1994   CABG X5   CORONARY ARTERY BYPASS GRAFT  07/29/2005   CABG X3   DIALYSIS/PERMA CATHETER INSERTION N/A 09/10/2022   Procedure: DIALYSIS/PERMA CATHETER INSERTION;  Surgeon: Jama Cordella MATSU, MD;  Location: [facility] INVASIVE CV LAB;  Service: Cardiovascular;  Laterality: N/A;   ESOPHAGOGASTRODUODENOSCOPY  (EGD) WITH ESOPHAGEAL DILATION  05/31/1998   GREEN LIGHT LASER TURP (TRANSURETHRAL RESECTION OF PROSTATE  01/30/1999   not cancerous   HERNIA REPAIR     LAPAROSCOPIC CHOLECYSTECTOMY     LEFT HEART CATH AND CORS/GRAFTS ANGIOGRAPHY N/A 07/28/2017   Procedure: LEFT HEART CATH AND CORS/GRAFTS ANGIOGRAPHY;  Surgeon: Burnard Debby LABOR, MD;  Location: [facility] INVASIVE CV LAB;  Service: Cardiovascular;  Laterality: N/A;   LUNG SURGERY  05/31/1994   S/P CABG, had to put staple in lung after it had collapsed   MINOR REMOVAL OF PERITONEAL DIALYSIS CATHETER  10/2022   UMBILICAL HERNIA REPAIR     w/chole    Social History   Tobacco Use   Smoking status: Former    Current packs/day: 0.00    Average packs/day: 3.0 packs/day for 20.0 years (60.0 ttl pk-yrs)    Types: Cigarettes    Start date: 07/25/1966    Quit date: 07/25/1986    Years since quitting: 37.6   Smokeless tobacco: Never  Vaping Use   Vaping status: Never Used  Substance Use Topics   Alcohol use: No    Alcohol/week: 0.0 standard drinks of alcohol   Drug use: No     Medication list has been reviewed and updated.  Current Meds  Medication Sig   amiodarone  (PACERONE ) 200 MG tablet Take 2 tablets (400 mg total) by mouth daily.   atorvastatin  (LIPITOR) 40 MG tablet Take 1 tablet (40 mg total) by mouth daily.   Cholecalciferol  (VITAMIN D -3) 125 MCG (5000 UT) TABS Take 2,000 Units by mouth daily.    doxepin  (SINEQUAN ) 10 MG capsule Take 1 capsule (10 mg total) by mouth at bedtime. (Patient taking differently: Take 10 mg by mouth at bedtime as needed.)   furosemide  (LASIX ) 20 MG tablet Take 60 mg by mouth daily as needed for edema.   gabapentin  (NEURONTIN ) 100 MG capsule Take 100 mg by mouth at bedtime.   levothyroxine  (SYNTHROID ) 75 MCG tablet TAKE 1 TABLET BY MOUTH EVERY DAY BEFORE BREAKFAST   midodrine  (PROAMATINE ) 5 MG tablet Take 1 tablet (5 mg total) by mouth 3 (three) times daily with meals.   Multiple Vitamin (MULTIVITAMIN PO)  Take 1 tablet by mouth daily.   nitroGLYCERIN  (NITROSTAT ) 0.4 MG SL tablet Place 0.4 mg under the tongue every 5 (five) minutes as needed for chest pain (Up to 3 times).   ondansetron  (ZOFRAN ) 4 MG tablet Take 1 tablet (4 mg total) by mouth every 8 (eight) hours as needed for nausea or vomiting.   pantoprazole  (PROTONIX ) 40 MG tablet TAKE 1 TABLET (40 MG TOTAL) BY MOUTH 2 (TWO) TIMES DAILY. HOME MED.   senna (SENOKOT) 8.6 MG tablet Take 1 tablet by mouth as needed for constipation.   [DISCONTINUED] allopurinol  (ZYLOPRIM ) 100 MG tablet TAKE 1 TABLET BY MOUTH EVERYDAY AT BEDTIME   [DISCONTINUED] aspirin  EC 81 MG tablet Take  1 tablet (81 mg total) by mouth daily. Swallow whole.   [DISCONTINUED] azithromycin  (ZITHROMAX ) 250 MG tablet Take 250 mg by mouth.       03/20/2024    3:16 PM 10/07/2023    2:07 PM 08/15/2023    1:44 PM 07/26/2023    1:58 PM  GAD 7 : Generalized Anxiety Score  Nervous, Anxious, on Edge 1 3 2  0  Control/stop worrying 1 1 0 0  Worry too much - different things 1 1 0 0  Trouble relaxing 0 1 0 0  Restless 0 1 2 0  Easily annoyed or irritable 0 1 2 0  Afraid - awful might happen 0 1 2 0  Total GAD 7 Score 3 9 8  0  Anxiety Difficulty Not difficult at all Somewhat difficult  Not difficult at all       03/20/2024    3:16 PM 10/07/2023    2:07 PM 08/15/2023    1:44 PM  Depression screen PHQ 2/9  Decreased Interest 3 3 0  Down, Depressed, Hopeless 0 0 0  PHQ - 2 Score 3 3 0  Altered sleeping 1 1 0  Tired, decreased energy 1 3 3   Change in appetite 0 0 0  Feeling bad or failure about yourself  0 0 0  Trouble concentrating 0 0 0  Moving slowly or fidgety/restless 0 3 3  Suicidal thoughts 0 0 0  PHQ-9 Score 5 10 6   Difficult doing work/chores Not difficult at all Extremely dIfficult Somewhat difficult    BP Readings from Last 3 Encounters:  03/20/24 90/60  02/27/24 128/74  12/21/23 128/74    Physical Exam Constitutional:      Appearance: He is obese.   Cardiovascular:     Rate and Rhythm: Normal rate and regular rhythm.  Pulmonary:     Effort: Pulmonary effort is normal.     Breath sounds: No wheezing or rhonchi.  Musculoskeletal:     Cervical back: Tenderness present.     Right lower leg: Edema present.     Left lower leg: Edema present.  Lymphadenopathy:     Cervical: No cervical adenopathy.  Neurological:     General: No focal deficit present.     Mental Status: He is alert.     Gait: Gait abnormal (in wheelchair).     Wt Readings from Last 3 Encounters:  03/20/24 198 lb (89.8 kg)  02/27/24 201 lb (91.2 kg)  12/21/23 202 lb (91.6 kg)    BP 90/60   Pulse 82   Temp 97.9 F (36.6 C) (Oral)   Ht 5' 9 (1.753 m)   Wt 198 lb (89.8 kg)   SpO2 97%   BMI 29.24 kg/m   Assessment and Plan:  Problem List Items Addressed This Visit       Unprioritized   Gouty arthropathy   Relevant Medications   allopurinol  (ZYLOPRIM ) 100 MG tablet   aspirin  EC 81 MG tablet   Paroxysmal atrial fibrillation (HCC) (Chronic)   No longer on Eliquis  Continue aspirin  and statin. ASA refilled      Relevant Medications   aspirin  EC 81 MG tablet   Oropharyngeal dysphagia   I suspect this is positional due to kyphosis. He will try extending his neck with swallowing or adding to soft food like applesauce      Other Visit Diagnoses       Post-COVID chronic cough    -  Primary   improved after 2 zpaks continue robitussin as  needed     PND (post-nasal drip)       recommend trial of claritin  10 mg daily       No follow-ups on file.    Ronald HILARIO Adie, MD [facility] Medicine [place]

## 2024-03-20 NOTE — Patient Instructions (Signed)
 Claritin 10 mg daily

## 2024-03-20 NOTE — Assessment & Plan Note (Signed)
 No longer on Eliquis  Continue aspirin  and statin. ASA refilled

## 2024-03-21 ENCOUNTER — Other Ambulatory Visit: Payer: Self-pay | Admitting: Internal Medicine

## 2024-03-21 DIAGNOSIS — L2989 Other pruritus: Secondary | ICD-10-CM

## 2024-03-23 NOTE — Telephone Encounter (Signed)
 Requested Prescriptions  Pending Prescriptions Disp Refills   doxepin  (SINEQUAN ) 10 MG capsule [Pharmacy Med Name: DOXEPIN  10 MG CAPSULE] 90 capsule 1    Sig: TAKE 1 CAPSULE BY MOUTH AT BEDTIME.     Psychiatry:  Antidepressants - Heterocyclics (TCAs) Passed - 03/23/2024 11:18 AM      Passed - Valid encounter within last 6 months    Recent Outpatient Visits           3 days ago Post-COVID chronic cough   [place] [facility] at [facility], Leita DEL, MD   3 weeks ago COVID-19   [facility] & [facility] Medicine at [facility], Leita DEL, MD   3 months ago Generalized abdominal pain   [place] [facility] at [facility], Leita DEL, MD   4 months ago Pruritus due to systemic disorder   [facility] Medicine at [facility], Leita DEL, MD   5 months ago Spinal stenosis of lumbar region without neurogenic claudication   [facility] at [facility], Leita DEL, MD       Future Appointments             In 1 month McAlhany, Lonni BIRCH, MD [facility] HeartCare at [facility] Dept of [facility], H&[facility]           Psychiatry:  Antidepressants - Heterocyclics (TCAs) - doxepin  Passed - 03/23/2024 11:18 AM      Passed - Valid encounter within last 12 months    Recent Outpatient Visits           3 days ago Post-COVID chronic cough   [place] [facility] at [facility], Leita DEL, MD   3 weeks ago COVID-19   [facility] Primary Care & Sports Medicine at [facility], Leita DEL, MD   3 months ago Generalized abdominal pain   [place] [facility] at [facility], Leita DEL, MD   4 months ago Pruritus due to systemic disorder   [facility] Primary Care & Sports Medicine at [facility], Leita DEL, MD   5 months ago Spinal stenosis of  lumbar region without neurogenic claudication   [facility] Medicine at [facility], Leita DEL, MD       Future Appointments             In 1 month McAlhany, Lonni BIRCH, MD [facility] HeartCare at [facility] A Dept of [facility]. [facility], H&V

## 2024-03-30 ENCOUNTER — Encounter: Payer: Self-pay | Admitting: Internal Medicine

## 2024-03-30 ENCOUNTER — Ambulatory Visit (INDEPENDENT_AMBULATORY_CARE_PROVIDER_SITE_OTHER): Admitting: Internal Medicine

## 2024-03-30 ENCOUNTER — Ambulatory Visit: Admitting: Internal Medicine

## 2024-03-30 VITALS — BP 106/58 | HR 84 | Ht 69.0 in

## 2024-03-30 DIAGNOSIS — L2989 Other pruritus: Secondary | ICD-10-CM

## 2024-03-30 MED ORDER — TRIAMCINOLONE ACETONIDE 0.1 % EX OINT: TOPICAL_OINTMENT | Freq: Two times a day (BID) | CUTANEOUS | 0 refills | Status: DC

## 2024-03-30 NOTE — Assessment & Plan Note (Signed)
 continue Claritin , Doxepin  and moisturizers TAC/Cerave to apply tid prn to local hot spots Query nephrology re: meds for itching in ESRD

## 2024-03-30 NOTE — Progress Notes (Signed)
 Date:  03/30/2024   Name:  Ronald [name] [facility]   DOB:  1946/04/15   MRN:  [id number]   Chief Complaint: Pruritis  Rash This is a new problem. The current episode started in the past 7 days. The problem has been waxing and waning since onset. The rash is diffuse (but much worse on his flanks since yesterday). The rash is characterized by dryness and itchiness. He was exposed to nothing. Associated symptoms include shortness of breath. Pertinent negatives include no fatigue or fever. Treatments tried: on Doxepin  for generalized chronic itching; moisturizers, claritin . The treatment provided mild relief.    Review of Systems  Constitutional:  Negative for chills, fatigue and fever.  Respiratory:  Positive for shortness of breath. Negative for chest tightness.   Cardiovascular:  Negative for chest pain.  Skin:  Positive for rash.     Lab Results  Component Value Date   NA 135 11/08/2023   K 4.8 11/08/2023   CO2 25 11/08/2023   GLUCOSE 91 11/08/2023   BUN 41 (H) 11/08/2023   CREATININE 5.86 (H) 11/08/2023   CALCIUM  8.9 11/08/2023   EGFR 14 (L) 05/19/2022   GFRNONAA 9 (L) 11/08/2023   Lab Results  Component Value Date   CHOL 127 07/19/2023   HDL 49 07/19/2023   LDLCALC 65 07/19/2023   TRIG 59 07/19/2023   CHOLHDL 2.6 07/19/2023   Lab Results  Component Value Date   TSH 4.523 (H) 11/07/2023   Lab Results  Component Value Date   HGBA1C 4.7 (L) 08/06/2023   Lab Results  Component Value Date   WBC 4.7 12/21/2023   HGB 10.2 (L) 12/21/2023   HCT 30.6 (L) 12/21/2023   MCV 89.7 12/21/2023   PLT 115 (L) 12/21/2023   Lab Results  Component Value Date   ALT 28 11/06/2023   AST 44 (H) 11/06/2023   ALKPHOS 167 (H) 11/06/2023   BILITOT 1.5 (H) 11/06/2023   Lab Results  Component Value Date   VD25OH 83 09/06/2022     Patient Active Problem List   Diagnosis Date Noted   Oropharyngeal dysphagia 03/20/2024   Exudative age-related macular degeneration, left eye, with active  choroidal neovascularization (HCC) 03/20/2024   Hx of ventricular tachycardia 02/27/2024   Abnormal EKG 11/08/2023   Tachyarrhythmia 11/06/2023   Dyslipidemia 09/30/2023   ESRD (end stage renal disease) (HCC) 09/30/2023   Non-sustained ventricular tachycardia (HCC) 09/29/2023   Hypothyroidism 09/29/2023   Tremor of both hands 09/05/2023   Pruritus due to systemic disorder 09/05/2023   Hypotension 08/07/2023   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   HFrEF (heart failure with reduced ejection fraction) (HCC) 07/19/2023   Muscle tension headache 11/15/2022   Acquired thrombophilia 11/10/2022   ESRD (end stage renal disease) on dialysis (HCC) 09/16/2022   Physical deconditioning 09/16/2022   Bilateral leg edema 09/13/2022   OSA (obstructive sleep apnea) 07/22/2022   Chronic systolic CHF (congestive heart failure) (HCC) 07/15/2022   Typical atrial flutter (HCC) 07/15/2022   Acquired hypothyroidism 05/28/2022   GERD without esophagitis 05/28/2022   Essential hypertension 05/05/2022   Prediabetes 04/06/2022   Spinal stenosis of lumbar region without neurogenic claudication 04/06/2022   Weakness of both lower extremities 04/06/2022   Upper airway cough syndrome 10/01/2021   Hyperkalemia 01/16/2020   Bursitis of hip 12/18/2018   Paroxysmal atrial fibrillation (HCC) 06/10/2018   Atherosclerosis of native arteries of extremity with intermittent claudication 06/25/2016   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   Coronary artery disease involving native  coronary artery with angina pectoris    Allergic rhinitis 08/19/2015   Anemia 10/04/2014   AA (aortic aneurysm) 10/04/2014   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   Basal cell papilloma 10/04/2014   Unspecified osteoarthritis, unspecified site 10/04/2014   Thoracic aortic aneurysm (TAA) 10/04/2014   Atherosclerotic heart disease of native coronary artery without angina pectoris 10/04/2014   Benign prostatic hyperplasia with lower  urinary tract symptoms 10/24/2012   DOE (dyspnea on exertion) 11/12/2009   Renal artery stenosis     Mixed hyperlipidemia 09/04/2008   History of redo bypass grafting 09/04/2008    Allergies  Allergen Reactions   Predicort [Prednisolone] Other (See Comments)    Stomach pain   Ciprofloxacin Other (See Comments)    GI upset   Hydrochlorothiazide Other (See Comments)    Dehydration   Hydrocodone Nausea Only and Other (See Comments)    Stomach upset    Hydrocodone-Acetaminophen  Nausea Only    Stomach upset   Sulfa Antibiotics Other (See Comments)    Cannot recall   Penicillins Hives, Rash and Other (See Comments)    Has patient had a PCN reaction causing immediate rash, facial/tongue/throat swelling, SOB or lightheadedness with hypotension: YES  Has patient had a PCN reaction causing severe rash involving mucus membranes or skin necrosis: NO  Has patient had a PCN reaction that required hospitalization NO  Has patient had a PCN reaction occurring within the last 10 years:NO  If all of the above answers are NO, then may proceed with Cephalosporin use.  Has patient had a PCN reaction causing immediate rash, facial/tongue/throat swelling, SOB or lightheadedness with hypotension: YES Has patient had a PCN reaction causing severe rash involving mucus membranes or skin necrosis: NO Has patient had a PCN reaction that required hospitalization NO Has patient had a PCN reaction occurring within the last 10 years:NO If all of the above answers are NO, then may proceed with Cephalosporin use.    Past Surgical History:  Procedure Laterality Date   CARDIAC CATHETERIZATION  several   CARDIAC CATHETERIZATION N/A 12/13/2014   Procedure: Left Heart Cath and Coronary Angiography;  Surgeon: Candyce GORMAN Reek, MD;  Location: [facility] INVASIVE CV LAB;  Service: Cardiovascular;  Laterality: N/A;   CARDIAC CATHETERIZATION  12/13/2014   Procedure: Coronary Stent Intervention;  Surgeon: Candyce GORMAN Reek, MD;  Location: [facility] INVASIVE CV LAB;  Service: Cardiovascular;;   CARDIAC CATHETERIZATION N/A 10/07/2015   Procedure: Left Heart Cath and Cors/Grafts Angiography;  Surgeon: Lonni JONETTA Cash, MD;  Location: [facility] INVASIVE CV LAB;  Service: Cardiovascular;  Laterality: N/A;   CARDIAC CATHETERIZATION N/A 10/07/2015   Procedure: Coronary Stent Intervention;  Surgeon: Lonni JONETTA Cash, MD;  Location: [facility] INVASIVE CV LAB;  Service: Cardiovascular;  Laterality: N/A;   CARDIOVERSION N/A 05/25/2022   Procedure: CARDIOVERSION;  Surgeon: Sheena Pugh, DO;  Location: [facility] ENDOSCOPY;  Service: Cardiovascular;  Laterality: N/A;   CATARACT EXTRACTION W/PHACO Left 10/21/2021   Procedure: CATARACT EXTRACTION PHACO AND INTRAOCULAR LENS PLACEMENT (IOC) LEFT 3.94 00:33.7;  Surgeon: Myrna Adine Anes, MD;  Location: [facility];  Service: Ophthalmology;  Laterality: Left;   CATARACT EXTRACTION W/PHACO Right 11/09/2021   Procedure: CATARACT EXTRACTION PHACO AND INTRAOCULAR LENS PLACEMENT (IOC) RIGHT;  Surgeon: Myrna Adine Anes, MD;  Location: [facility];  Service: Ophthalmology;  Laterality: Right;  3.59 0:29.4   CORONARY ANGIOPLASTY  several   CORONARY ANGIOPLASTY WITH STENT PLACEMENT  2005; 12/13/2014   2; 1   CORONARY ARTERY BYPASS GRAFT  05/31/1994  CABG X5   CORONARY ARTERY BYPASS GRAFT  07/29/2005   CABG X3   DIALYSIS/PERMA CATHETER INSERTION N/A 09/10/2022   Procedure: DIALYSIS/PERMA CATHETER INSERTION;  Surgeon: Jama Cordella MATSU, MD;  Location: [facility] INVASIVE CV LAB;  Service: Cardiovascular;  Laterality: N/A;   ESOPHAGOGASTRODUODENOSCOPY (EGD) WITH ESOPHAGEAL DILATION  05/31/1998   GREEN LIGHT LASER TURP (TRANSURETHRAL RESECTION OF PROSTATE  01/30/1999   not cancerous   HERNIA REPAIR     LAPAROSCOPIC CHOLECYSTECTOMY     LEFT HEART CATH AND CORS/GRAFTS ANGIOGRAPHY N/A 07/28/2017   Procedure: LEFT HEART CATH AND CORS/GRAFTS ANGIOGRAPHY;  Surgeon: Burnard Debby LABOR,  MD;  Location: [facility] INVASIVE CV LAB;  Service: Cardiovascular;  Laterality: N/A;   LUNG SURGERY  05/31/1994   S/P CABG, had to put staple in lung after it had collapsed   MINOR REMOVAL OF PERITONEAL DIALYSIS CATHETER  10/2022   UMBILICAL HERNIA REPAIR     w/chole    Social History   Tobacco Use   Smoking status: Former    Current packs/day: 0.00    Average packs/day: 3.0 packs/day for 20.0 years (60.0 ttl pk-yrs)    Types: Cigarettes    Start date: 07/25/1966    Quit date: 07/25/1986    Years since quitting: 37.7   Smokeless tobacco: Never  Vaping Use   Vaping status: Never Used  Substance Use Topics   Alcohol use: No    Alcohol/week: 0.0 standard drinks of alcohol   Drug use: No     Medication list has been reviewed and updated.  Current Meds  Medication Sig   allopurinol  (ZYLOPRIM ) 100 MG tablet TAKE 1 TABLET BY MOUTH EVERYDAY AT BEDTIME   amiodarone  (PACERONE ) 200 MG tablet Take 2 tablets (400 mg total) by mouth daily.   aspirin  EC 81 MG tablet Take 1 tablet (81 mg total) by mouth daily. Swallow whole.   atorvastatin  (LIPITOR) 40 MG tablet Take 1 tablet (40 mg total) by mouth daily.   Cholecalciferol  (VITAMIN D -3) 125 MCG (5000 UT) TABS Take 2,000 Units by mouth daily.    doxepin  (SINEQUAN ) 10 MG capsule TAKE 1 CAPSULE BY MOUTH AT BEDTIME.   furosemide  (LASIX ) 20 MG tablet Take 60 mg by mouth daily as needed for edema.   gabapentin  (NEURONTIN ) 100 MG capsule Take 100 mg by mouth at bedtime.   levothyroxine  (SYNTHROID ) 75 MCG tablet TAKE 1 TABLET BY MOUTH EVERY DAY BEFORE BREAKFAST   midodrine  (PROAMATINE ) 5 MG tablet Take 1 tablet (5 mg total) by mouth 3 (three) times daily with meals.   Multiple Vitamin (MULTIVITAMIN PO) Take 1 tablet by mouth daily.   nitroGLYCERIN  (NITROSTAT ) 0.4 MG SL tablet Place 0.4 mg under the tongue every 5 (five) minutes as needed for chest pain (Up to 3 times).   ondansetron  (ZOFRAN ) 4 MG tablet Take 1 tablet (4 mg total) by mouth every 8  (eight) hours as needed for nausea or vomiting.   pantoprazole  (PROTONIX ) 40 MG tablet TAKE 1 TABLET (40 MG TOTAL) BY MOUTH 2 (TWO) TIMES DAILY. HOME MED.   senna (SENOKOT) 8.6 MG tablet Take 1 tablet by mouth as needed for constipation.   triamcinolone  0.1% oint-Cerave equivalent lotion 1:1 mixture Apply topically 2 (two) times daily.       03/30/2024    3:18 PM 03/20/2024    3:16 PM 10/07/2023    2:07 PM 08/15/2023    1:44 PM  GAD 7 : Generalized Anxiety Score  Nervous, Anxious, on Edge 1 1 3 2   Control/stop  worrying 1 1 1  0  Worry too much - different things 1 1 1  0  Trouble relaxing  0 1 0  Restless 0 0 1 2  Easily annoyed or irritable 0 0 1 2  Afraid - awful might happen 0 0 1 2  Total GAD 7 Score  3 9 8   Anxiety Difficulty Not difficult at all Not difficult at all Somewhat difficult        03/30/2024    3:18 PM 03/20/2024    3:16 PM 10/07/2023    2:07 PM  Depression screen PHQ 2/9  Decreased Interest 3 3 3   Down, Depressed, Hopeless 0 0 0  PHQ - 2 Score 3 3 3   Altered sleeping 1 1 1   Tired, decreased energy 1 1 3   Change in appetite 0 0 0  Feeling bad or failure about yourself  0 0 0  Trouble concentrating 0 0 0  Moving slowly or fidgety/restless 0 0 3  Suicidal thoughts 0 0 0  PHQ-9 Score 5 5 10   Difficult doing work/chores Not difficult at all Not difficult at all Extremely dIfficult    BP Readings from Last 3 Encounters:  03/30/24 (!) 106/58  03/20/24 90/60  02/27/24 128/74    Physical Exam Cardiovascular:     Rate and Rhythm: Normal rate and regular rhythm.  Pulmonary:     Breath sounds: No wheezing.  Skin:    Coloration: Skin is ashen.     Findings: Ecchymosis present.     Comments: Dry skin on back with excoriations along both flanks with associated redness  Neurological:     Mental Status: He is alert.     Wt Readings from Last 3 Encounters:  03/20/24 198 lb (89.8 kg)  02/27/24 201 lb (91.2 kg)  12/21/23 202 lb (91.6 kg)    BP (!) 106/58    Pulse 84   Ht 5' 9 (1.753 m)   SpO2 100%   BMI 29.24 kg/m   Assessment and Plan:  Problem List Items Addressed This Visit       Unprioritized   Pruritus due to systemic disorder - Primary (Chronic)   continue Claritin , Doxepin  and moisturizers TAC/Cerave to apply tid prn to local hot spots Query nephrology re: meds for itching in ESRD       Relevant Medications   triamcinolone  0.1% oint-Cerave equivalent lotion 1:1 mixture    No follow-ups on file.    Leita HILARIO Adie, MD [facility] [place]

## 2024-04-07 ENCOUNTER — Emergency Department

## 2024-04-07 ENCOUNTER — Other Ambulatory Visit: Payer: Self-pay

## 2024-04-07 ENCOUNTER — Inpatient Hospital Stay
Admission: EM | Admit: 2024-04-07 | Discharge: 2024-04-12 | DRG: 535 | Disposition: A | Discharge status: Skilled Nursing Facility | Attending: Internal Medicine | Admitting: Internal Medicine

## 2024-04-07 DIAGNOSIS — W19XXXA Unspecified fall, initial encounter: Secondary | ICD-10-CM

## 2024-04-07 DIAGNOSIS — Z992 Dependence on renal dialysis: Secondary | ICD-10-CM

## 2024-04-07 DIAGNOSIS — E1122 Type 2 diabetes mellitus with diabetic chronic kidney disease: Secondary | ICD-10-CM | POA: Diagnosis present

## 2024-04-07 DIAGNOSIS — I255 Ischemic cardiomyopathy: Secondary | ICD-10-CM | POA: Diagnosis present

## 2024-04-07 DIAGNOSIS — I4819 Other persistent atrial fibrillation: Secondary | ICD-10-CM | POA: Diagnosis present

## 2024-04-07 DIAGNOSIS — Z87891 Personal history of nicotine dependence: Secondary | ICD-10-CM

## 2024-04-07 DIAGNOSIS — Z955 Presence of coronary angioplasty implant and graft: Secondary | ICD-10-CM

## 2024-04-07 DIAGNOSIS — Z7982 Long term (current) use of aspirin: Secondary | ICD-10-CM

## 2024-04-07 DIAGNOSIS — E871 Hypo-osmolality and hyponatremia: Secondary | ICD-10-CM | POA: Diagnosis present

## 2024-04-07 DIAGNOSIS — I252 Old myocardial infarction: Secondary | ICD-10-CM

## 2024-04-07 DIAGNOSIS — I420 Dilated cardiomyopathy: Secondary | ICD-10-CM | POA: Diagnosis present

## 2024-04-07 DIAGNOSIS — S32599A Other specified fracture of unspecified pubis, initial encounter for closed fracture: Secondary | ICD-10-CM | POA: Diagnosis present

## 2024-04-07 DIAGNOSIS — I480 Paroxysmal atrial fibrillation: Secondary | ICD-10-CM | POA: Diagnosis not present

## 2024-04-07 DIAGNOSIS — I132 Hypertensive heart and chronic kidney disease with heart failure and with stage 5 chronic kidney disease, or end stage renal disease: Secondary | ICD-10-CM | POA: Diagnosis present

## 2024-04-07 DIAGNOSIS — Z888 Allergy status to other drugs, medicaments and biological substances status: Secondary | ICD-10-CM

## 2024-04-07 DIAGNOSIS — S32402A Unspecified fracture of left acetabulum, initial encounter for closed fracture: Principal | ICD-10-CM

## 2024-04-07 DIAGNOSIS — S32592A Other specified fracture of left pubis, initial encounter for closed fracture: Principal | ICD-10-CM | POA: Diagnosis present

## 2024-04-07 DIAGNOSIS — H9192 Unspecified hearing loss, left ear: Secondary | ICD-10-CM | POA: Diagnosis present

## 2024-04-07 DIAGNOSIS — Z881 Allergy status to other antibiotic agents status: Secondary | ICD-10-CM

## 2024-04-07 DIAGNOSIS — E785 Hyperlipidemia, unspecified: Secondary | ICD-10-CM | POA: Diagnosis present

## 2024-04-07 DIAGNOSIS — M7989 Other specified soft tissue disorders: Secondary | ICD-10-CM | POA: Diagnosis present

## 2024-04-07 DIAGNOSIS — Z9841 Cataract extraction status, right eye: Secondary | ICD-10-CM

## 2024-04-07 DIAGNOSIS — Z8679 Personal history of other diseases of the circulatory system: Secondary | ICD-10-CM

## 2024-04-07 DIAGNOSIS — Z9842 Cataract extraction status, left eye: Secondary | ICD-10-CM

## 2024-04-07 DIAGNOSIS — Z885 Allergy status to narcotic agent status: Secondary | ICD-10-CM

## 2024-04-07 DIAGNOSIS — M48061 Spinal stenosis, lumbar region without neurogenic claudication: Secondary | ICD-10-CM | POA: Diagnosis present

## 2024-04-07 DIAGNOSIS — M51369 Other intervertebral disc degeneration, lumbar region without mention of lumbar back pain or lower extremity pain: Secondary | ICD-10-CM | POA: Diagnosis present

## 2024-04-07 DIAGNOSIS — N186 End stage renal disease: Secondary | ICD-10-CM | POA: Diagnosis present

## 2024-04-07 DIAGNOSIS — Z79899 Other long term (current) drug therapy: Secondary | ICD-10-CM

## 2024-04-07 DIAGNOSIS — Z951 Presence of aortocoronary bypass graft: Secondary | ICD-10-CM

## 2024-04-07 DIAGNOSIS — W010XXA Fall on same level from slipping, tripping and stumbling without subsequent striking against object, initial encounter: Secondary | ICD-10-CM | POA: Diagnosis present

## 2024-04-07 DIAGNOSIS — Z555 Less than a high school diploma: Secondary | ICD-10-CM

## 2024-04-07 DIAGNOSIS — Z83438 Family history of other disorder of lipoprotein metabolism and other lipidemia: Secondary | ICD-10-CM

## 2024-04-07 DIAGNOSIS — D6959 Other secondary thrombocytopenia: Secondary | ICD-10-CM | POA: Diagnosis present

## 2024-04-07 DIAGNOSIS — I5022 Chronic systolic (congestive) heart failure: Secondary | ICD-10-CM

## 2024-04-07 DIAGNOSIS — I2510 Atherosclerotic heart disease of native coronary artery without angina pectoris: Secondary | ICD-10-CM | POA: Diagnosis present

## 2024-04-07 DIAGNOSIS — I714 Abdominal aortic aneurysm, without rupture, unspecified: Secondary | ICD-10-CM | POA: Diagnosis present

## 2024-04-07 DIAGNOSIS — D696 Thrombocytopenia, unspecified: Secondary | ICD-10-CM | POA: Insufficient documentation

## 2024-04-07 DIAGNOSIS — Z8249 Family history of ischemic heart disease and other diseases of the circulatory system: Secondary | ICD-10-CM

## 2024-04-07 DIAGNOSIS — Z79890 Hormone replacement therapy (postmenopausal): Secondary | ICD-10-CM

## 2024-04-07 DIAGNOSIS — R011 Cardiac murmur, unspecified: Secondary | ICD-10-CM | POA: Diagnosis present

## 2024-04-07 DIAGNOSIS — I9589 Other hypotension: Secondary | ICD-10-CM | POA: Diagnosis present

## 2024-04-07 DIAGNOSIS — Z961 Presence of intraocular lens: Secondary | ICD-10-CM | POA: Diagnosis present

## 2024-04-07 DIAGNOSIS — I712 Thoracic aortic aneurysm, without rupture, unspecified: Secondary | ICD-10-CM | POA: Diagnosis present

## 2024-04-07 DIAGNOSIS — J439 Emphysema, unspecified: Secondary | ICD-10-CM | POA: Diagnosis present

## 2024-04-07 DIAGNOSIS — Z880 Allergy status to penicillin: Secondary | ICD-10-CM

## 2024-04-07 DIAGNOSIS — Z6829 Body mass index (BMI) 29.0-29.9, adult: Secondary | ICD-10-CM

## 2024-04-07 DIAGNOSIS — Z882 Allergy status to sulfonamides status: Secondary | ICD-10-CM

## 2024-04-07 DIAGNOSIS — D631 Anemia in chronic kidney disease: Secondary | ICD-10-CM | POA: Diagnosis present

## 2024-04-07 DIAGNOSIS — S32432A Displaced fracture of anterior column [iliopubic] of left acetabulum, initial encounter for closed fracture: Secondary | ICD-10-CM | POA: Diagnosis present

## 2024-04-07 DIAGNOSIS — Y9301 Activity, walking, marching and hiking: Secondary | ICD-10-CM | POA: Diagnosis present

## 2024-04-07 DIAGNOSIS — Z66 Do not resuscitate: Secondary | ICD-10-CM | POA: Diagnosis present

## 2024-04-07 DIAGNOSIS — E663 Overweight: Secondary | ICD-10-CM | POA: Diagnosis present

## 2024-04-07 DIAGNOSIS — S32592S Other specified fracture of left pubis, sequela: Secondary | ICD-10-CM

## 2024-04-07 DIAGNOSIS — I5042 Chronic combined systolic (congestive) and diastolic (congestive) heart failure: Secondary | ICD-10-CM | POA: Diagnosis present

## 2024-04-07 LAB — CBC WITH DIFFERENTIAL/PLATELET
Abs Immature Granulocytes: 0.03 K/uL (ref 0.00–0.07)
Basophils Absolute: 0.1 K/uL (ref 0.0–0.1)
Basophils Relative: 1 %
Eosinophils Absolute: 0.2 K/uL (ref 0.0–0.5)
Eosinophils Relative: 3 %
HCT: 34.4 % — ABNORMAL LOW (ref 39.0–52.0)
Hemoglobin: 11.3 g/dL — ABNORMAL LOW (ref 13.0–17.0)
Immature Granulocytes: 1 %
Lymphocytes Relative: 11 %
Lymphs Abs: 0.6 K/uL — ABNORMAL LOW (ref 0.7–4.0)
MCH: 31 pg (ref 26.0–34.0)
MCHC: 32.8 g/dL (ref 30.0–36.0)
MCV: 94.5 fL (ref 80.0–100.0)
Monocytes Absolute: 0.6 K/uL (ref 0.1–1.0)
Monocytes Relative: 11 %
Neutro Abs: 4.4 K/uL (ref 1.7–7.7)
Neutrophils Relative %: 73 %
Platelets: 99 K/uL — ABNORMAL LOW (ref 150–400)
RBC: 3.64 MIL/uL — ABNORMAL LOW (ref 4.22–5.81)
RDW: 18 % — ABNORMAL HIGH (ref 11.5–15.5)
WBC: 5.9 K/uL (ref 4.0–10.5)
nRBC: 0 % (ref 0.0–0.2)

## 2024-04-07 LAB — BASIC METABOLIC PANEL WITH GFR
Anion gap: 16 — ABNORMAL HIGH (ref 5–15)
BUN: 42 mg/dL — ABNORMAL HIGH (ref 8–23)
CO2: 26 mmol/L (ref 22–32)
Calcium: 9 mg/dL (ref 8.9–10.3)
Chloride: 96 mmol/L — ABNORMAL LOW (ref 98–111)
Creatinine, Ser: 5.8 mg/dL — ABNORMAL HIGH (ref 0.61–1.24)
GFR, Estimated: 9 mL/min — ABNORMAL LOW (ref >60)
Glucose, Bld: 191 mg/dL — ABNORMAL HIGH (ref 70–99)
Potassium: 4 mmol/L (ref 3.5–5.1)
Sodium: 138 mmol/L (ref 135–145)

## 2024-04-07 MED ORDER — MORPHINE SULFATE (PF) 2 MG/ML IV SOLN
2.0000 mg | Freq: Once | INTRAVENOUS | Status: AC
  Administered 2024-04-07: 2 mg via INTRAVENOUS
  Filled 2024-04-07: qty 1

## 2024-04-07 MED ORDER — MORPHINE SULFATE (PF) 2 MG/ML IV SOLN
2.0000 mg | INTRAVENOUS | Status: DC | PRN
  Administered 2024-04-07 – 2024-04-08 (×2): 2 mg via INTRAVENOUS
  Filled 2024-04-07 (×2): qty 1

## 2024-04-07 NOTE — H&P (Incomplete)
 History and Physical    [name] FMW:[id number] DOB: 18-Nov-1945 DOA: 04/07/2024 DOS: the patient was seen and examined on 04/07/2024 PCP: [name], MD  Patient coming from: Home  Chief Complaint:  Chief Complaint  Patient presents with  . Fall    HPI: Ronald [name] is a 78 y.o. 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 tach on amiodarone (intolerant of mexiletine), currently being followed by palliative care, being admitted with a left acetabular and pubic rami fracture, resulting from an accidental fall in a parking lot when his legs just  gave out.  Denies hitting his head or passing out.  Denied preceding chest pain, palpitations or shortness of breath or lightheadedness, visual disturbance or headache. In the ED, vitals unremarkable CBC notable forBaseline anemia of 11.3 and baseline thrombocytopenia of 99 BMP in keeping with dialysis status but otherwise unremarkable EKG showed A-fib at 101 with nonspecific T wave changes  Trauma imaging including CT head C-spine which were nonacute CT hip showed a left acetabular fracture as well as left superior and inferior pubic rami fractures  The ED provider spoke with orthopedics who said non operative  for now, recommended toe-touch weightbearing and rolling walker at all times  Got a couple of doses of morphine  in the ED  Admission requested for pain control and to arrange for SNF/rehab     Review of Systems: As mentioned in the history of present illness. All other systems reviewed and are negative.  Past Medical History:  Diagnosis Date  . AAA (abdominal aortic aneurysm)    a. 3cm by US  2015; b. 07/2023 Ao U/S: Abd Ao 2.6cm.  SABRA Acute blood loss anemia (ABLA) 08/05/2023  . Arthritis    hips; back  (12/13/2014)  . CAD (coronary artery disease)    a. 1996 s/p CABGx4: LIMA-LAD, VG-Cx, VG-RCA, VG-diag; b. 2007 s/p redo CABGx2: VG-OM, VG-RCA due to VG disease; c. NSTEMI 11/2014 s/p DES to SVG-OM from the Y graft; d. 09/2015 PCI: distal body of SVG-Diag; e. 07/2017 Cath: LAD 100p, LCX 100p, RCA 95ost/100p, VG->dRCA 10p ISR, 30p/m/d, Y graft->OM3 (ok) & D2 26m, patent distal stent, LIMA->LAD ok, VG->OM2 100, VG->RPAV 100-->Med rx.  . Chronic combined systolic and diastolic CHF (congestive heart failure) (HCC)    a. remote EF 40-45% in 2006. b. Normal EF 2014. c. Echo 07/2016 EF 45-50%, grade 1 DD. d. Echo 2020 30% to 35%; e. 03/2023 Echo: EF 20-25%, glob HK, inf wall best preserved. Mildly reduced RV fxn, RVSP 39.73mmHg. Mod dil LA. Mod MR/TR. AoV sclerosis.  . Chronic lower back pain   . CKD (chronic kidney disease), stage IV (HCC)   . COPD (chronic obstructive pulmonary disease) (HCC)   . Deafness in left ear   . Degenerative disc disease, lumbar   . Diabetes mellitus, type 2 (HCC) 10/04/2014   Microalbumin 05/11/2012-100. Foot exam/monofilament 05/11/2012-normal.  . 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  . Emphysema   . Esophageal stricture 07/02/1998   EGD  . Genital candidiasis in male 10/25/2012  . GERD (gastroesophageal reflux disease)   . History of gout    last flareup was in 2007 (12/13/2014)  . History of hiatal hernia   . Hyperlipidemia   . Hypertension   . Ischemic cardiomyopathy 2006   Echo 2020: EF 30-35%, diffuse  hypokinesis  . Ischemic cardiomyopathy   . Myocardial infarction (HCC) 12/13/2014  . Near syncope 08/05/2023  . NSTEMI (non-ST elevated myocardial infarction) (HCC) 12/13/2014  . PAF (paroxysmal atrial fibrillation) (HCC)    a. CHA2DS2VASc = 6-->eliquis .  On Amio.  SABRA PVC's (premature ventricular contractions)   . Renal artery stenosis    a. noted on CT 2008.  . Type II diabetes mellitus (HCC)    Diet control   . Unstable angina (HCC) 07/27/2017  . Walking  pneumonia 1990's  . Wears dentures    full upper   Past Surgical History:  Procedure Laterality Date  . CARDIAC CATHETERIZATION  several  . CARDIAC CATHETERIZATION N/A 12/13/2014   Procedure: Left Heart Cath and Coronary Angiography;  Surgeon: Candyce GORMAN Reek, MD;  Location: [facility] INVASIVE CV LAB;  Service: Cardiovascular;  Laterality: N/A;  . CARDIAC CATHETERIZATION  12/13/2014   Procedure: Coronary Stent Intervention;  Surgeon: Candyce GORMAN Reek, MD;  Location: [facility] INVASIVE CV LAB;  Service: Cardiovascular;;  . CARDIAC CATHETERIZATION N/A 10/07/2015   Procedure: Left Heart Cath and Cors/Grafts Angiography;  Surgeon: Lonni JONETTA Cash, MD;  Location: [facility] INVASIVE CV LAB;  Service: Cardiovascular;  Laterality: N/A;  . CARDIAC CATHETERIZATION N/A 10/07/2015   Procedure: Coronary Stent Intervention;  Surgeon: Lonni JONETTA Cash, MD;  Location: [facility] INVASIVE CV LAB;  Service: Cardiovascular;  Laterality: N/A;  . CARDIOVERSION N/A 05/25/2022   Procedure: CARDIOVERSION;  Surgeon: Sheena Pugh, DO;  Location: [facility] ENDOSCOPY;  Service: Cardiovascular;  Laterality: N/A;  . CATARACT EXTRACTION W/PHACO Left 10/21/2021   Procedure: CATARACT EXTRACTION PHACO AND INTRAOCULAR LENS PLACEMENT (IOC) LEFT 3.94 00:33.7;  Surgeon: Myrna Adine Anes, MD;  Location: [facility] SURGERY CN[facility];  Service: Ophthalmology;  Laterality: Left;  . CATARACT EXTRACTION W/PHACO Right 11/09/2021   Procedure: CATARACT EXTRACTION PHACO AND INTRAOCULAR LENS PLACEMENT (IOC) RIGHT;  Surgeon: Myrna Adine Anes, MD;  Location: [facility] SURGERY CNTR;  Service: Ophthalmology;  Laterality: Right;  3.59 0:29.4  . CORONARY ANGIOPLASTY  several  . CORONARY ANGIOPLASTY WITH STENT PLACEMENT  2005; 12/13/2014   2; 1  . CORONARY ARTERY BYPASS GRAFT  05/31/1994   CABG X5  . CORONARY ARTERY BYPASS GRAFT  07/29/2005   CABG X3  . DIALYSIS/PERMA CATHETER INSERTION N/A 09/10/2022   Procedure: DIALYSIS/PERMA CATHETER INSERTION;  Surgeon:  Jama Cordella MATSU, MD;  Location: [facility] INVASIVE CV LAB;  Service: Cardiovascular;  Laterality: N/A;  . ESOPHAGOGASTRODUODENOSCOPY (EGD) WITH ESOPHAGEAL DILATION  05/31/1998  . GREEN LIGHT LASER TURP (TRANSURETHRAL RESECTION OF PROSTATE  01/30/1999   not cancerous  . HERNIA REPAIR    . LAPAROSCOPIC CHOLECYSTECTOMY    . LEFT HEART CATH AND CORS/GRAFTS ANGIOGRAPHY N/A 07/28/2017   Procedure: LEFT HEART CATH AND CORS/GRAFTS ANGIOGRAPHY;  Surgeon: Burnard Debby LABOR, MD;  Location: [facility] INVASIVE CV LAB;  Service: Cardiovascular;  Laterality: N/A;  . LUNG SURGERY  05/31/1994   S/P CABG, had to put staple in lung after it had collapsed  . MINOR REMOVAL OF PERITONEAL DIALYSIS CATHETER  10/2022  . UMBILICAL HERNIA REPAIR     w/chole   Social History:  reports that he quit smoking about 37 years ago. His smoking use included cigarettes. He started smoking about 57 years ago. He has a 60 pack-year smoking history. He has never used smokeless tobacco. He reports that he does not drink alcohol and does not use drugs.  Allergies  Allergen Reactions  . Predicort [Prednisolone] Other (See Comments)    Stomach pain  . Ciprofloxacin Other (See  Comments)    GI upset  . Hydrochlorothiazide Other (See Comments)    Dehydration  . Hydrocodone Nausea Only and Other (See Comments)    Stomach upset   . Hydrocodone-Acetaminophen  Nausea Only    Stomach upset  . Sulfa Antibiotics Other (See Comments)    Cannot recall  . Penicillins Hives, Rash and Other (See Comments)    Has patient had a PCN reaction causing immediate rash, facial/tongue/throat swelling, SOB or lightheadedness with hypotension: YES  Has patient had a PCN reaction causing severe rash involving mucus membranes or skin necrosis: NO  Has patient had a PCN reaction that required hospitalization NO  Has patient had a PCN reaction occurring within the last 10 years:NO  If all of the above answers are NO, then may proceed with Cephalosporin  use.  Has patient had a PCN reaction causing immediate rash, facial/tongue/throat swelling, SOB or lightheadedness with hypotension: YES Has patient had a PCN reaction causing severe rash involving mucus membranes or skin necrosis: NO Has patient had a PCN reaction that required hospitalization NO Has patient had a PCN reaction occurring within the last 10 years:NO If all of the above answers are NO, then may proceed with Cephalosporin use.    Family History  Problem Relation Age of Onset  . Heart attack Mother        MI  . Stroke Mother   . Heart disease Mother   . Hypertension Mother   . Hyperlipidemia Mother   . Asthma Mother   . Heart disease Father   . Rheumatic fever Father   . Colon cancer Neg Hx     Prior to Admission medications   Medication Sig Start Date End Date Taking? Authorizing Provider  allopurinol  (ZYLOPRIM ) 100 MG tablet TAKE 1 TABLET BY MOUTH EVERYDAY AT BEDTIME 03/20/24   [name], MD  amiodarone  (PACERONE ) 200 MG tablet Take 2 tablets (400 mg total) by mouth daily. 12/28/23   Verlin Lonni BIRCH, MD  aspirin  EC 81 MG tablet Take 1 tablet (81 mg total) by mouth daily. Swallow whole. 03/20/24   [name], MD  atorvastatin  (LIPITOR) 40 MG tablet Take 1 tablet (40 mg total) by mouth daily. 03/19/24   [name], MD  Cholecalciferol  (VITAMIN D -3) 125 MCG (5000 UT) TABS Take 2,000 Units by mouth daily.     [provider]  doxepin  (SINEQUAN ) 10 MG capsule TAKE 1 CAPSULE BY MOUTH AT BEDTIME. 03/23/24   [name], MD  furosemide  (LASIX ) 20 MG tablet Take 60 mg by mouth daily as needed for edema. 10/12/22   [provider]  gabapentin  (NEURONTIN ) 100 MG capsule Take 100 mg by mouth at bedtime. 01/30/24   [provider]  levothyroxine  (SYNTHROID ) 75 MCG tablet TAKE 1 TABLET BY MOUTH EVERY DAY BEFORE BREAKFAST 01/16/24   [name], MD  midodrine  (PROAMATINE ) 5 MG tablet Take 1 tablet (5 mg total) by mouth 3  (three) times daily with meals. 12/21/23   Berglund, Laura H, MD  Multiple Vitamin (MULTIVITAMIN PO) Take 1 tablet by mouth daily.    [provider]  nitroGLYCERIN  (NITROSTAT ) 0.4 MG SL tablet Place 0.4 mg under the tongue every 5 (five) minutes as needed for chest pain (Up to 3 times).    [provider]  ondansetron  (ZOFRAN ) 4 MG tablet Take 1 tablet (4 mg total) by mouth every 8 (eight) hours as needed for nausea or vomiting. 02/27/24   [name], MD  pantoprazole  (  PROTONIX ) 40 MG tablet TAKE 1 TABLET (40 MG TOTAL) BY MOUTH 2 (TWO) TIMES DAILY. HOME MED. 01/20/24   Berglund, Laura H, MD  senna (SENOKOT) 8.6 MG tablet Take 1 tablet by mouth as needed for constipation.    [provider]  triamcinolone  0.1% oint-Cerave equivalent lotion 1:1 mixture Apply topically 2 (two) times daily. 03/30/24   [name], MD    Physical Exam: Vitals:   04/07/24 2210 04/07/24 2230 04/07/24 2300 04/07/24 2330  BP: 114/60 108/64 (!) 105/59 (!) 104/55  Pulse: 100 100 99 (!) 101  Resp:   20   Temp:   98.4 F (36.9 C)   TempSrc:   Oral   SpO2: 100% 100% 100% 98%  Weight:      Height:       Physical Exam  Labs on Admission: I have personally reviewed following labs and imaging studies  CBC: Recent Labs  Lab 04/07/24 2002  WBC 5.9  NEUTROABS 4.4  HGB 11.3*  HCT 34.4*  MCV 94.5  PLT 99*   Basic Metabolic Panel: Recent Labs  Lab 04/07/24 2002  NA 138  K 4.0  CL 96*  CO2 26  GLUCOSE 191*  BUN 42*  CREATININE 5.80*  CALCIUM  9.0   GFR: Estimated Creatinine Clearance: 11.3 mL/min (A) (by C-G formula based on SCr of 5.8 mg/dL (H)). Liver Function Tests: No results for input(s): AST, ALT, ALKPHOS, BILITOT, PROT, ALBUMIN in the last 168 hours. No results for input(s): LIPASE, AMYLASE in the last 168 hours. No results for input(s): AMMONIA in the last 168 hours. Coagulation Profile: No results for input(s): INR, PROTIME in the  last 168 hours. Cardiac Enzymes: No results for input(s): CKTOTAL, CKMB, CKMBINDEX, TROPONINI in the last 168 hours. BNP (last 3 results) No results for input(s): PROBNP in the last 8760 hours. HbA1C: No results for input(s): HGBA1C in the last 72 hours. CBG: No results for input(s): GLUCAP in the last 168 hours. Lipid Profile: No results for input(s): CHOL, HDL, LDLCALC, TRIG, CHOLHDL, LDLDIRECT in the last 72 hours. Thyroid  Function Tests: No results for input(s): TSH, T4TOTAL, FREET4, T3FREE, THYROIDAB in the last 72 hours. Anemia Panel: No results for input(s): VITAMINB12, FOLATE, FERRITIN, TIBC, IRON, RETICCTPCT in the last 72 hours. Urine analysis:    Component Value Date/Time   COLORURINE AMBER (A) 08/06/2023 1540   APPEARANCEUR CLOUDY (A) 08/06/2023 1540   LABSPEC 1.022 08/06/2023 1540   PHURINE 5.0 08/06/2023 1540   GLUCOSEU NEGATIVE 08/06/2023 1540   HGBUR SMALL (A) 08/06/2023 1540   BILIRUBINUR SMALL (A) 08/06/2023 1540   KETONESUR NEGATIVE 08/06/2023 1540   PROTEINUR >=300 (A) 08/06/2023 1540   NITRITE NEGATIVE 08/06/2023 1540   LEUKOCYTESUR SMALL (A) 08/06/2023 1540    Radiological Exams on Admission: CT Hip Left Wo Contrast Result Date: 04/07/2024 EXAM: CT OF THE LEFT HIP WITHOUT IV CONTRAST 04/07/2024 08:39:57 PM TECHNIQUE: CT of the left hip was performed without the administration of intravenous contrast. Multiplanar reformatted images are provided for review. Automated exposure control, iterative reconstruction, and/or weight based adjustment of the mA/kV was utilized to reduce the radiation dose to as low as reasonably achievable. COMPARISON: None available. CLINICAL HISTORY: Hip trauma, fracture suspected, xray done. FINDINGS: BONES: Nondisplaced left superior pubic ramus fracture extending into the anterior acetabulum, mildly comminuted. Mildly displaced left inferior pubic ramus fracture. Left proximal femur is  intact. No aggressive appearing osseous abnormality or periostitis. SOFT TISSUE: No significant soft tissue edema or fluid collections. No soft  tissue mass. JOINT: No significant degenerative changes. No osseous erosions. INTRAPELVIC CONTENTS: Limited images of the intrapelvic contents are unremarkable. IMPRESSION: 1. Mildly comminuted left acetabular fracture. 2. Left superior and inferior pubic rami fractures, as above. 3. Left hip is intact. Electronically signed by: Pinkie Pebbles MD 04/07/2024 08:46 PM EST RP Workstation: [id number]   CT Cervical Spine Wo Contrast Result Date: 04/07/2024 EXAM: CT CERVICAL SPINE WITHOUT CONTRAST 04/07/2024 08:39:57 PM TECHNIQUE: CT of the cervical spine was performed without the administration of intravenous contrast. Multiplanar reformatted images are provided for review. Automated exposure control, iterative reconstruction, and/or weight based adjustment of the mA/kV was utilized to reduce the radiation dose to as low as reasonably achievable. COMPARISON: None available. CLINICAL HISTORY: Neck trauma (Age >= 65y) FINDINGS: CERVICAL SPINE: BONES AND ALIGNMENT: No acute fracture or traumatic malalignment. DEGENERATIVE CHANGES: No significant degenerative changes. SOFT TISSUES: No prevertebral soft tissue swelling. LUNGS: Mild right apical pleural parenchymal scarring. IMPRESSION: 1. No acute abnormality of the cervical spine. Electronically signed by: Pinkie Pebbles MD 04/07/2024 08:43 PM EST RP Workstation: [id number]   CT Head Wo Contrast Result Date: 04/07/2024 EXAM: CT HEAD WITHOUT CONTRAST 04/07/2024 08:39:57 PM TECHNIQUE: CT of the head was performed without the administration of intravenous contrast. Automated exposure control, iterative reconstruction, and/or weight based adjustment of the mA/kV was utilized to reduce the radiation dose to as low as reasonably achievable. COMPARISON: 09/29/2023. CLINICAL HISTORY: Head trauma, minor (Age >= 65y). FINDINGS: BRAIN  AND VENTRICLES: No acute hemorrhage. No evidence of acute infarct. No hydrocephalus. No extra-axial collection. No mass effect or midline shift. Age-related atrophy. Mild periventricular chronic small vessel ischemia. Vascular calcifications. ORBITS: Bilateral cataract resection. SINUSES: No acute abnormality. SOFT TISSUES AND SKULL: No acute soft tissue abnormality. No skull fracture. IMPRESSION: 1. No acute intracranial abnormality. Electronically signed by: Pinkie Pebbles MD 04/07/2024 08:42 PM EST RP Workstation: [id number]   DG Hip Unilat W or Wo Pelvis 2-3 Views Left Result Date: 04/07/2024 EXAM: 2 or 3 VIEW(S) XRAY OF THE PELVIS AND LEFT HIP 04/07/2024 08:30:15 PM COMPARISON: X-rays 12/04/2014. CLINICAL HISTORY: Left hip pain after fall. FINDINGS: BONES: The bones are diffusely osteopenic. There is a healed left inferior pubic ramus fracture. The bones are diffusely osteopenic limiting evaluation for subtle nondisplaced fracture. No definite acute fracture identified. JOINTS: SI joints are symmetric. Bilateral hips demonstrate normal alignment. Joint spaces are maintained. No definite acute dislocation identified. SOFT TISSUES: There are peripheral vascular calcifications present. IMPRESSION: 1. No definite acute fracture or dislocation identified. 2. Diffuse osteopenia limits evaluation for subtle nondisplaced fracture. If there is high clinical concern for occult fracture, consider further evaluation with CT. 3. Healed left inferior pubic ramus fracture. Electronically signed by: Greig Pique MD 04/07/2024 08:42 PM EST RP Workstation: [id number]   Data Reviewed for HPI: Relevant notes from primary care and specialist visits, past discharge summaries as available in EHR, including Care Everywhere. Prior diagnostic testing as pertinent to current admission diagnoses Updated medications and problem lists for reconciliation ED course, including vitals, labs, imaging, treatment and response to  treatment Triage notes, nursing and pharmacy notes and ED provider's notes Notable results as noted above in HPI      Assessment and Plan: No notes have been filed under this hospital service. Service: Hospitalist       DVT prophylaxis: Lovenox ***  Consults: none***  Advance Care Planning:   Code Status: Prior ***  Family Communication: none***  Disposition Plan: Back to previous home environment  Severity of Illness: {Observation/Inpatient:21159}  Author: Delayne LULLA Solian, MD 04/07/2024 11:57 PM  For on call review [email or web address].

## 2024-04-07 NOTE — ED Notes (Signed)
 Patient changed into gown. Clothing cut with approval from patient and spouse at bedside. Skin tear to left arm cleansed and new dressing placed. Patient tolerated well

## 2024-04-07 NOTE — H&P (Signed)
 History and Physical    Patient: Ronald [name] DOB: Aug 12, 1945 DOA: 04/07/2024 DOS: the patient was seen and examined on 04/07/2024 PCP: [name], MD  Patient coming from: Home  Chief Complaint:  Chief Complaint  Patient presents with   Fall    HPI: Ronald [name] is a 78 y.o. 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 tach on amiodarone , not on [facility], currently being followed by palliative care, being admitted with a left acetabular and pubic rami fracture, resulting from a presumed accidental fall in a parking lot when his legs just  gave out.  Denies hitting his head or passing out.  Denied preceding chest pain, palpitations or shortness of breath or lightheadedness, visual disturbance or headache. In the ED, vitals unremarkable CBC notable forBaseline anemia of 11.3 and baseline thrombocytopenia of 99 BMP in keeping with dialysis status but otherwise unremarkable EKG showed A-fib at 101 with nonspecific T wave changes  Trauma imaging including CT head C-spine which were nonacute CT hip showed a left acetabular fracture as well as left superior and inferior pubic rami fractures  The ED provider spoke with orthopedics who said non operative  for now, recommended toe-touch weightbearing and rolling walker at all times  Got a couple of doses of morphine  in the ED  Admission requested for pain control and to arrange for SNF/rehab     Review of Systems: As mentioned in the history of present illness. All other systems reviewed and are negative.  Past Medical History:  Diagnosis Date   AAA (abdominal aortic aneurysm)    a. 3cm by US  2015; b. 07/2023 Ao U/S: Abd Ao 2.6cm.   Acute blood loss anemia (ABLA) 08/05/2023   Arthritis    hips; back  (12/13/2014)   CAD (coronary artery disease)    a. 1996 s/p CABGx4: LIMA-LAD, VG-Cx, VG-RCA, VG-diag; b. 2007 s/p redo CABGx2: VG-OM, VG-RCA due to VG disease; c. NSTEMI 11/2014 s/p DES to SVG-OM from the Y graft; d. 09/2015 PCI: distal body of SVG-Diag; e. 07/2017 Cath: LAD 100p, LCX 100p, RCA 95ost/100p, VG->dRCA 10p ISR, 30p/m/d, Y graft->OM3 (ok) & D2 71m, patent distal stent, LIMA->LAD ok, VG->OM2 100, VG->RPAV 100-->Med rx.   Chronic combined systolic and diastolic CHF (congestive heart failure) (HCC)    a. remote EF 40-45% in 2006. b. Normal EF 2014. c. Echo 07/2016 EF 45-50%, grade 1 DD. d. Echo 2020 30% to 35%; e. 03/2023 Echo: EF 20-25%, glob HK, inf wall best preserved. Mildly reduced RV fxn, RVSP 39.66mmHg. Mod dil LA. Mod MR/TR. AoV sclerosis.   Chronic lower back pain    CKD (chronic kidney disease), stage IV (HCC)    COPD (chronic obstructive pulmonary disease) (HCC)    Deafness in left ear    Degenerative disc disease, lumbar    Diabetes mellitus, type 2 (HCC) 10/04/2014   Microalbumin 05/11/2012-100. Foot exam/monofilament 05/11/2012-normal.   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   Emphysema    Esophageal stricture 07/02/1998   EGD   Genital candidiasis in male 10/25/2012   GERD (gastroesophageal reflux disease)    History of gout    last flareup was in 2007 (12/13/2014)   History of hiatal hernia    Hyperlipidemia    Hypertension    Ischemic cardiomyopathy 2006   Echo 2020: EF  30-35%, diffuse hypokinesis   Ischemic cardiomyopathy    Myocardial infarction (HCC) 12/13/2014   Near syncope 08/05/2023   NSTEMI (non-ST elevated myocardial infarction) (HCC) 12/13/2014   PAF (paroxysmal atrial fibrillation) (HCC)    a. CHA2DS2VASc = 6-->eliquis .  On Amio.   PVC's (premature ventricular contractions)    Renal artery stenosis    a. noted on CT 2008.   Type II diabetes mellitus (HCC)    Diet control    Unstable angina (HCC) 07/27/2017   Walking pneumonia 1990's   Wears  dentures    full upper   Past Surgical History:  Procedure Laterality Date   CARDIAC CATHETERIZATION  several   CARDIAC CATHETERIZATION N/A 12/13/2014   Procedure: Left Heart Cath and Coronary Angiography;  Surgeon: Candyce GORMAN Reek, MD;  Location[facility] INVASIVE CV LAB;  Service: Cardiovascular;  Laterality: N/A;   CARDIAC CATHETERIZATION  12/13/2014   Procedure: Coronary Stent Intervention;  Surgeon: Candyce GORMAN Reek, MD;  Location: [facility] INVASIVE CV LAB;  Service: Cardiovascular;;   CARDIAC CATHETERIZATION N/A 10/07/2015   Procedure: Left Heart Cath and Cors/Grafts Angiography;  Surgeon: Lonni JONETTA Cash, MD;  Location: [facility] INVASIVE CV LAB;  Service: Cardiovascular;  Laterality: N/A;   CARDIAC CATHETERIZATION N/A 10/07/2015   Procedure: Coronary Stent Intervention;  Surgeon: Lonni JONETTA Cash, MD;  Location: [facility] INVASIVE CV LAB;  Service: Cardiovascular;  Laterality: N/A;   CARDIOVERSION N/A 05/25/2022   Procedure: CARDIOVERSION;  Surgeon: Sheena Pugh, DO;  Location: [facility] ENDOSCOPY;  Service: Cardiovascular;  Laterality: N/A;   CATARACT EXTRACTION W/PHACO Left 10/21/2021   Procedure: CATARACT EXTRACTION PHACO AND INTRAOCULAR LENS PLACEMENT (IOC) LEFT 3.94 00:33.7;  Surgeon: Myrna Adine Anes, MD;  Location: [facility] SURGERY CNTR;  Service: Ophthalmology;  Laterality: Left;   CATARACT EXTRACTION W/PHACO Right 11/09/2021   Procedure: CATARACT EXTRACTION PHACO AND INTRAOCULAR LENS PLACEMENT (IOC) RIGHT;  Surgeon: Myrna Adine Anes, MD;  Location: [facility] SURGERY [facility];  Service: Ophthalmology;  Laterality: Right;  3.59 0:29.4   CORONARY ANGIOPLASTY  several   CORONARY ANGIOPLASTY WITH STENT PLACEMENT  2005; 12/13/2014   2; 1   CORONARY ARTERY BYPASS GRAFT  05/31/1994   CABG X5   CORONARY ARTERY BYPASS GRAFT  07/29/2005   CABG X3   DIALYSIS/PERMA CATHETER INSERTION N/A 09/10/2022   Procedure: DIALYSIS/PERMA CATHETER INSERTION;  Surgeon: Jama Cordella MATSU, MD;  Location: [facility]  INVASIVE CV LAB;  Service: Cardiovascular;  Laterality: N/A;   ESOPHAGOGASTRODUODENOSCOPY (EGD) WITH ESOPHAGEAL DILATION  05/31/1998   GREEN LIGHT LASER TURP (TRANSURETHRAL RESECTION OF PROSTATE  01/30/1999   not cancerous   HERNIA REPAIR     LAPAROSCOPIC CHOLECYSTECTOMY     LEFT HEART CATH AND CORS/GRAFTS ANGIOGRAPHY N/A 07/28/2017   Procedure: LEFT HEART CATH AND CORS/GRAFTS ANGIOGRAPHY;  Surgeon: Burnard Debby LABOR, MD;  Location: [facility] INVASIVE CV LAB;  Service: Cardiovascular;  Laterality: N/A;   LUNG SURGERY  05/31/1994   S/P CABG, had to put staple in lung after it had collapsed   MINOR REMOVAL OF PERITONEAL DIALYSIS CATHETER  10/2022   UMBILICAL HERNIA REPAIR     w/chole   Social History:  reports that he quit smoking about 37 years ago. His smoking use included cigarettes. He started smoking about 57 years ago. He has a 60 pack-year smoking history. He has never used smokeless tobacco. He reports that he does not drink alcohol and does not use drugs.  Allergies  Allergen Reactions   Predicort [Prednisolone] Other (See Comments)    Stomach pain   Ciprofloxacin  Other (See Comments)    GI upset   Hydrochlorothiazide Other (See Comments)    Dehydration   Hydrocodone Nausea Only and Other (See Comments)    Stomach upset    Hydrocodone-Acetaminophen  Nausea Only    Stomach upset   Sulfa Antibiotics Other (See Comments)    Cannot recall   Penicillins Hives, Rash and Other (See Comments)    Has patient had a PCN reaction causing immediate rash, facial/tongue/throat swelling, SOB or lightheadedness with hypotension: YES  Has patient had a PCN reaction causing severe rash involving mucus membranes or skin necrosis: NO  Has patient had a PCN reaction that required hospitalization NO  Has patient had a PCN reaction occurring within the last 10 years:NO  If all of the above answers are NO, then may proceed with Cephalosporin use.  Has patient had a PCN reaction causing immediate  rash, facial/tongue/throat swelling, SOB or lightheadedness with hypotension: YES Has patient had a PCN reaction causing severe rash involving mucus membranes or skin necrosis: NO Has patient had a PCN reaction that required hospitalization NO Has patient had a PCN reaction occurring within the last 10 years:NO If all of the above answers are NO, then may proceed with Cephalosporin use.    Family History  Problem Relation Age of Onset   Heart attack Mother        MI   Stroke Mother    Heart disease Mother    Hypertension Mother    Hyperlipidemia Mother    Asthma Mother    Heart disease Father    Rheumatic fever Father    Colon cancer Neg Hx     Prior to Admission medications   Medication Sig Start Date End Date Taking? Authorizing Provider  allopurinol  (ZYLOPRIM ) 100 MG tablet TAKE 1 TABLET BY MOUTH EVERYDAY AT BEDTIME 03/20/24   Berglund, Laura H, MD  amiodarone  (PACERONE ) 200 MG tablet Take 2 tablets (400 mg total) by mouth daily. 12/28/23   Verlin Lonni BIRCH, MD  aspirin  EC 81 MG tablet Take 1 tablet (81 mg total) by mouth daily. Swallow whole. 03/20/24   [name], MD  atorvastatin  (LIPITOR) 40 MG tablet Take 1 tablet (40 mg total) by mouth daily. 03/19/24   [name], MD  Cholecalciferol  (VITAMIN D -3) 125 MCG (5000 UT) TABS Take 2,000 Units by mouth daily.     [provider]  doxepin  (SINEQUAN ) 10 MG capsule TAKE 1 CAPSULE BY MOUTH AT BEDTIME. 03/23/24   [name], MD  furosemide  (LASIX ) 20 MG tablet Take 60 mg by mouth daily as needed for edema. 10/12/22   [provider]  gabapentin  (NEURONTIN ) 100 MG capsule Take 100 mg by mouth at bedtime. 01/30/24   [provider]  levothyroxine  (SYNTHROID ) 75 MCG tablet TAKE 1 TABLET BY MOUTH EVERY DAY BEFORE BREAKFAST 01/16/24   [name], MD  midodrine  (PROAMATINE ) 5 MG tablet Take 1 tablet (5 mg total) by mouth 3 (three) times daily with meals. 12/21/23   Berglund, Laura H, MD   Multiple Vitamin (MULTIVITAMIN PO) Take 1 tablet by mouth daily.    [provider]  nitroGLYCERIN  (NITROSTAT ) 0.4 MG SL tablet Place 0.4 mg under the tongue every 5 (five) minutes as needed for chest pain (Up to 3 times).    [provider]  ondansetron  (ZOFRAN ) 4 MG tablet Take 1 tablet (4 mg total) by mouth every 8 (eight) hours as needed for nausea or vomiting. 02/27/24   [name], MD  pantoprazole  (PROTONIX ) 40 MG tablet TAKE 1 TABLET (40 MG TOTAL) BY MOUTH 2 (TWO) TIMES DAILY. HOME MED. 01/20/24   Berglund, Laura H, MD  senna (SENOKOT) 8.6 MG tablet Take 1 tablet by mouth as needed for constipation.    [provider]  triamcinolone  0.1% oint-Cerave equivalent lotion 1:1 mixture Apply topically 2 (two) times daily. 03/30/24   [name], MD    Physical Exam: Vitals:   04/07/24 2210 04/07/24 2230 04/07/24 2300 04/07/24 2330  BP: 114/60 108/64 (!) 105/59 (!) 104/55  Pulse: 100 100 99 (!) 101  Resp:   20   Temp:   98.4 F (36.9 C)   TempSrc:   Oral   SpO2: 100% 100% 100% 98%  Weight:      Height:       Physical Exam Vitals and nursing note reviewed.  Constitutional:      General: He is not in acute distress.    Comments: Resting comfortably.  Multiple bruises noted on arms  HENT:     Head: Normocephalic and atraumatic.  Cardiovascular:     Rate and Rhythm: Normal rate and regular rhythm.     Heart sounds: Normal heart sounds.  Pulmonary:     Effort: Pulmonary effort is normal.     Breath sounds: Normal breath sounds.  Abdominal:     Palpations: Abdomen is soft.     Tenderness: There is no abdominal tenderness.  Neurological:     Mental Status: Mental status is at baseline.     Labs on Admission: I have personally reviewed following labs and imaging studies  CBC: Recent Labs  Lab 04/07/24 2002  WBC 5.9  NEUTROABS 4.4  HGB 11.3*  HCT 34.4*  MCV 94.5  PLT 99*   Basic Metabolic Panel: Recent Labs  Lab 04/07/24 2002   NA 138  K 4.0  CL 96*  CO2 26  GLUCOSE 191*  BUN 42*  CREATININE 5.80*  CALCIUM  9.0   GFR: Estimated Creatinine Clearance: 11.3 mL/min (A) (by C-G formula based on SCr of 5.8 mg/dL (H)). Liver Function Tests: No results for input(s): AST, ALT, ALKPHOS, BILITOT, PROT, ALBUMIN in the last 168 hours. No results for input(s): LIPASE, AMYLASE in the last 168 hours. No results for input(s): AMMONIA in the last 168 hours. Coagulation Profile: No results for input(s): INR, PROTIME in the last 168 hours. Cardiac Enzymes: No results for input(s): CKTOTAL, CKMB, CKMBINDEX, TROPONINI in the last 168 hours. BNP (last 3 results) No results for input(s): PROBNP in the last 8760 hours. HbA1C: No results for input(s): HGBA1C in the last 72 hours. CBG: No results for input(s): GLUCAP in the last 168 hours. Lipid Profile: No results for input(s): CHOL, HDL, LDLCALC, TRIG, CHOLHDL, LDLDIRECT in the last 72 hours. Thyroid  Function Tests: No results for input(s): TSH, T4TOTAL, FREET4, T3FREE, THYROIDAB in the last 72 hours. Anemia Panel: No results for input(s): VITAMINB12, FOLATE, FERRITIN, TIBC, IRON, RETICCTPCT in the last 72 hours. Urine analysis:    Component Value Date/Time   COLORURINE AMBER (A) 08/06/2023 1540   APPEARANCEUR CLOUDY (A) 08/06/2023 1540   LABSPEC 1.022 08/06/2023 1540   PHURINE 5.0 08/06/2023 1540   GLUCOSEU NEGATIVE 08/06/2023 1540   HGBUR SMALL (A) 08/06/2023 1540   BILIRUBINUR SMALL (A) 08/06/2023 1540   KETONESUR NEGATIVE 08/06/2023 1540   PROTEINUR >=300 (A) 08/06/2023 1540   NITRITE NEGATIVE 08/06/2023 1540   LEUKOCYTESUR SMALL (A) 08/06/2023 1540    Radiological Exams on Admission: CT Hip Left  Wo Contrast Result Date: 04/07/2024 EXAM: CT OF THE LEFT HIP WITHOUT IV CONTRAST 04/07/2024 08:39:57 PM TECHNIQUE: CT of the left hip was performed without the administration of intravenous contrast.  Multiplanar reformatted images are provided for review. Automated exposure control, iterative reconstruction, and/or weight based adjustment of the mA/kV was utilized to reduce the radiation dose to as low as reasonably achievable. COMPARISON: None available. CLINICAL HISTORY: Hip trauma, fracture suspected, xray done. FINDINGS: BONES: Nondisplaced left superior pubic ramus fracture extending into the anterior acetabulum, mildly comminuted. Mildly displaced left inferior pubic ramus fracture. Left proximal femur is intact. No aggressive appearing osseous abnormality or periostitis. SOFT TISSUE: No significant soft tissue edema or fluid collections. No soft tissue mass. JOINT: No significant degenerative changes. No osseous erosions. INTRAPELVIC CONTENTS: Limited images of the intrapelvic contents are unremarkable. IMPRESSION: 1. Mildly comminuted left acetabular fracture. 2. Left superior and inferior pubic rami fractures, as above. 3. Left hip is intact. Electronically signed by: Pinkie Pebbles MD 04/07/2024 08:46 PM EST RP Workstation: [id number]   CT Cervical Spine Wo Contrast Result Date: 04/07/2024 EXAM: CT CERVICAL SPINE WITHOUT CONTRAST 04/07/2024 08:39:57 PM TECHNIQUE: CT of the cervical spine was performed without the administration of intravenous contrast. Multiplanar reformatted images are provided for review. Automated exposure control, iterative reconstruction, and/or weight based adjustment of the mA/kV was utilized to reduce the radiation dose to as low as reasonably achievable. COMPARISON: None available. CLINICAL HISTORY: Neck trauma (Age >= 65y) FINDINGS: CERVICAL SPINE: BONES AND ALIGNMENT: No acute fracture or traumatic malalignment. DEGENERATIVE CHANGES: No significant degenerative changes. SOFT TISSUES: No prevertebral soft tissue swelling. LUNGS: Mild right apical pleural parenchymal scarring. IMPRESSION: 1. No acute abnormality of the cervical spine. Electronically signed by: Pinkie Pebbles MD 04/07/2024 08:43 PM EST RP Workstation: [id number]   CT Head Wo Contrast Result Date: 04/07/2024 EXAM: CT HEAD WITHOUT CONTRAST 04/07/2024 08:39:57 PM TECHNIQUE: CT of the head was performed without the administration of intravenous contrast. Automated exposure control, iterative reconstruction, and/or weight based adjustment of the mA/kV was utilized to reduce the radiation dose to as low as reasonably achievable. COMPARISON: 09/29/2023. CLINICAL HISTORY: Head trauma, minor (Age >= 65y). FINDINGS: BRAIN AND VENTRICLES: No acute hemorrhage. No evidence of acute infarct. No hydrocephalus. No extra-axial collection. No mass effect or midline shift. Age-related atrophy. Mild periventricular chronic small vessel ischemia. Vascular calcifications. ORBITS: Bilateral cataract resection. SINUSES: No acute abnormality. SOFT TISSUES AND SKULL: No acute soft tissue abnormality. No skull fracture. IMPRESSION: 1. No acute intracranial abnormality. Electronically signed by: Pinkie Pebbles MD 04/07/2024 08:42 PM EST RP Workstation: [id number]   DG Hip Unilat W or Wo Pelvis 2-3 Views Left Result Date: 04/07/2024 EXAM: 2 or 3 VIEW(S) XRAY OF THE PELVIS AND LEFT HIP 04/07/2024 08:30:15 PM COMPARISON: X-rays 12/04/2014. CLINICAL HISTORY: Left hip pain after fall. FINDINGS: BONES: The bones are diffusely osteopenic. There is a healed left inferior pubic ramus fracture. The bones are diffusely osteopenic limiting evaluation for subtle nondisplaced fracture. No definite acute fracture identified. JOINTS: SI joints are symmetric. Bilateral hips demonstrate normal alignment. Joint spaces are maintained. No definite acute dislocation identified. SOFT TISSUES: There are peripheral vascular calcifications present. IMPRESSION: 1. No definite acute fracture or dislocation identified. 2. Diffuse osteopenia limits evaluation for subtle nondisplaced fracture. If there is high clinical concern for occult fracture, consider  further evaluation with CT. 3. Healed left inferior pubic ramus fracture. Electronically signed by: Greig Pique MD 04/07/2024 08:42 PM EST RP Workstation: [id number]   Data Reviewed for  HPI: Relevant notes from primary care and specialist visits, past discharge summaries as available in EHR, including Care Everywhere. Prior diagnostic testing as pertinent to current admission diagnoses Updated medications and problem lists for reconciliation ED course, including vitals, labs, imaging, treatment and response to treatment Triage notes, nursing and pharmacy notes and ED provider's notes Notable results as noted above in HPI      Assessment and Plan: * Closed fracture of multiple pubic rami, initial encounter (HCC) Closed left acetabular fracture Nonoperative for now will Ortho consult from the ED Pain control PT consult   Ground level fall, undetermined but presumed accidental Continuous cardiac monitoring overnight given cardiac comorbidities PT eval  Chronic HFrEF (heart failure with reduced ejection fraction) (HCC) Ischemic cardiomyopathy, EF 20 to 25%, not an AICD candidate History of V. Tach GDMT intolerant due to chronic hypotension Clinically euvolemic   Persistent atrial fibrillation (HCC) Not currently on anticoagulation Continue amiodarone  and aspirin   CAD S/P CABG (1998, 2007), subsequent stents No acute concerns, no complaints of chest pain and EKG is nonacute Continue aspirin  and atorvastatin   Chronic hypotension Continue midodrine   ESRD (end stage renal disease) on dialysis [facility]) Nephrology consult for continuation of dialysis  Thoracic aortic aneurysm (TAA) No acute issues suspected at this time.  Spinal stenosis of lumbar region without neurogenic claudication No acute issues, pain control     DVT prophylaxis: Lovenox   Consults: ortho, Dr Lorelle and renal  Advance Care Planning:   Code Status: Prior   Family Communication:  none  Disposition Plan: Back to previous home environment  Severity of Illness: The appropriate patient status for this patient is OBSERVATION. Observation status is judged to be reasonable and necessary in order to provide the required intensity of service to ensure the patient's safety. The patient's presenting symptoms, physical exam findings, and initial radiographic and laboratory data in the context of their medical condition is felt to place them at decreased risk for further clinical deterioration. Furthermore, it is anticipated that the patient will be medically stable for discharge from the hospital within 2 midnights of admission.   Author: Delayne LULLA Solian, MD 04/07/2024 11:57 PM  For on call review [email or web address].

## 2024-04-07 NOTE — ED Triage Notes (Signed)
 While getting up patient states his legs gave out and fell in the parking lot. Denies LOC, did not hit head but it reporting left hip pain with left arm skin tear. Hx CHF, DM, ESRD-M/W/F. BGL 129  87-88 RA placed on 2L. No blood thinners. Non weight bearing on left leg unable to rise up on his own.

## 2024-04-07 NOTE — ED Notes (Signed)
 Patient transported to CT

## 2024-04-07 NOTE — ED Provider Notes (Addendum)
 [facility] Provider Note    Event Date/Time   First MD Initiated Contact with Patient 04/07/24 1944     (approximate)   History   Chief Complaint: Fall   HPI  Ronald [name] is a 78 y.o. 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  He reports that he was walking, felt like his legs gave out, causing him to fall to the ground.  Had left hip pain afterward, unable to get up or bear weight afterward.        Past Medical History:  Diagnosis Date   AAA (abdominal aortic aneurysm)    a. 3cm by US  2015; b. 07/2023 Ao U/S: Abd Ao 2.6cm.   Acute blood loss anemia (ABLA) 08/05/2023   Arthritis    hips; back (12/13/2014)   CAD (coronary artery disease)    a. 1996 s/p CABGx4: LIMA-LAD, VG-Cx, VG-RCA, VG-diag; b. 2007 s/p redo CABGx2: VG-OM, VG-RCA due to VG disease; c. NSTEMI 11/2014 s/p DES to SVG-OM from the Y graft; d. 09/2015 PCI: distal body of SVG-Diag; e. 07/2017 Cath: LAD 100p, LCX 100p, RCA 95ost/100p, VG->dRCA 10p ISR, 30p/m/d, Y graft->OM3 (ok) & D2 69m, patent distal stent, LIMA->LAD ok, VG->OM2 100, VG->RPAV 100-->Med rx.   Chronic combined systolic and diastolic CHF (congestive heart failure) (HCC)    a. remote EF 40-45% in 2006. b. Normal EF 2014. c. Echo 07/2016 EF 45-50%, grade 1 DD. d. Echo 2020 30% to 35%; e. 03/2023 Echo: EF 20-25%, glob HK, inf wall best preserved. Mildly reduced RV fxn, RVSP 39.8mmHg. Mod dil LA. Mod MR/TR. AoV sclerosis.   Chronic lower back pain    CKD (chronic kidney disease), stage IV (HCC)    COPD (chronic obstructive pulmonary disease) (HCC)    Deafness in left ear    Degenerative disc disease, lumbar    Diabetes mellitus, type 2 (HCC) 10/04/2014   Microalbumin 05/11/2012-100. Foot exam/monofilament 05/11/2012-normal.   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   Emphysema    Esophageal stricture 07/02/1998   EGD   Genital candidiasis  in male 10/25/2012   GERD (gastroesophageal reflux disease)    History of gout    last flareup was in 2007 (12/13/2014)   History of hiatal hernia    Hyperlipidemia    Hypertension    Ischemic cardiomyopathy 2006   Echo 2020: EF 30-35%, diffuse hypokinesis   Ischemic cardiomyopathy    Myocardial infarction (HCC) 12/13/2014   Near syncope 08/05/2023   NSTEMI (non-ST elevated myocardial infarction) (HCC) 12/13/2014   PAF (paroxysmal atrial fibrillation) (HCC)    a. CHA2DS2VASc = 6-->eliquis .  On Amio.   PVC's (premature ventricular contractions)    Renal artery stenosis    a. noted on CT 2008.   Type II diabetes mellitus (HCC)    Diet control    Unstable angina (HCC) 07/27/2017   Walking pneumonia 1990's   Wears dentures    full upper    Current Outpatient Rx   Order #: [id number] Class: Normal   Order #: [id number] Class: Normal   Order #: [id number] Class: Normal   Order #: [id number] Class: Normal   Order #: [id number] Class: Historical Med   Order #: [id number] Class: Normal   Order #: [id number] Class: Historical Med   Order #: [id number] Class: Historical Med   Order #: [id number] Class: Normal   Order #: [id number] Class: Normal   Order #: [id number] Class: Historical Med   Order #: [id number] Class: Historical Med  Order #: [id number] Class: Normal   Order #: [id number] Class: Normal   Order #: [id number] Class: Historical Med   Order #: [id number] Class: Normal    Past Surgical History:  Procedure Laterality Date   CARDIAC CATHETERIZATION  several   CARDIAC CATHETERIZATION N/A 12/13/2014   Procedure: Left Heart Cath and Coronary Angiography;  Surgeon: Candyce GORMAN Reek, MD;  Location: [facility] INVASIVE CV LAB;  Service: Cardiovascular;  Laterality: N/A;   CARDIAC CATHETERIZATION  12/13/2014   Procedure: Coronary Stent Intervention;  Surgeon: Candyce GORMAN Reek, MD;  Location: [facility] INVASIVE CV LAB;  Service: Cardiovascular;;   CARDIAC CATHETERIZATION N/A 10/07/2015   Procedure: Left Heart  Cath and Cors/Grafts Angiography;  Surgeon: Lonni JONETTA Cash, MD;  Location: [facility] INVASIVE CV LAB;  Service: Cardiovascular;  Laterality: N/A;   CARDIAC CATHETERIZATION N/A 10/07/2015   Procedure: Coronary Stent Intervention;  Surgeon: Lonni JONETTA Cash, MD;  Location: [facility] INVASIVE CV LAB;  Service: Cardiovascular;  Laterality: N/A;   CARDIOVERSION N/A 05/25/2022   Procedure: CARDIOVERSION;  Surgeon: Sheena Pugh, DO;  Location: [facility] ENDOSCOPY;  Service: Cardiovascular;  Laterality: N/A;   CATARACT EXTRACTION W/PHACO Left 10/21/2021   Procedure: CATARACT EXTRACTION PHACO AND INTRAOCULAR LENS PLACEMENT (IOC) LEFT 3.94 00:33.7;  Surgeon: Myrna Adine Anes, MD;  Location: [facility];  Service: Ophthalmology;  Laterality: Left;   CATARACT EXTRACTION W/PHACO Right 11/09/2021   Procedure: CATARACT EXTRACTION PHACO AND INTRAOCULAR LENS PLACEMENT (IOC) RIGHT;  Surgeon: Myrna Adine Anes, MD;  Location: [facility];  Service: Ophthalmology;  Laterality: Right;  3.59 0:29.4   CORONARY ANGIOPLASTY  several   CORONARY ANGIOPLASTY WITH STENT PLACEMENT  2005; 12/13/2014   2; 1   CORONARY ARTERY BYPASS GRAFT  05/31/1994   CABG X5   CORONARY ARTERY BYPASS GRAFT  07/29/2005   CABG X3   DIALYSIS/PERMA CATHETER INSERTION N/A 09/10/2022   Procedure: DIALYSIS/PERMA CATHETER INSERTION;  Surgeon: Jama Cordella MATSU, MD;  Location: [facility] INVASIVE CV LAB;  Service: Cardiovascular;  Laterality: N/A;   ESOPHAGOGASTRODUODENOSCOPY (EGD) WITH ESOPHAGEAL DILATION  05/31/1998   GREEN LIGHT LASER TURP (TRANSURETHRAL RESECTION OF PROSTATE  01/30/1999   not cancerous   HERNIA REPAIR     LAPAROSCOPIC CHOLECYSTECTOMY     LEFT HEART CATH AND CORS/GRAFTS ANGIOGRAPHY N/A 07/28/2017   Procedure: LEFT HEART CATH AND CORS/GRAFTS ANGIOGRAPHY;  Surgeon: Burnard Debby LABOR, MD;  Location: [facility] INVASIVE CV LAB;  Service: Cardiovascular;  Laterality: N/A;   LUNG SURGERY  05/31/1994   S/P CABG, had to put staple  in lung after it had collapsed   MINOR REMOVAL OF PERITONEAL DIALYSIS CATHETER  10/2022   UMBILICAL HERNIA REPAIR     w/chole    Physical Exam   Triage Vital Signs: ED Triage Vitals  Encounter Vitals Group     BP 04/07/24 1951 121/61     Girls Systolic BP Percentile --      Girls Diastolic BP Percentile --      Boys Systolic BP Percentile --      Boys Diastolic BP Percentile --      Pulse Rate 04/07/24 1951 74     Resp 04/07/24 1951 19     Temp 04/07/24 1951 97.6 F (36.4 C)     Temp Source 04/07/24 1951 Oral     SpO2 04/07/24 1951 98 %     Weight 04/07/24 1955 200 lb 9.9 oz (91 kg)     Height 04/07/24 1955 5' 9 (1.753 m)     Head Circumference --  Peak Flow --      Pain Score 04/07/24 1954 5     Pain Loc --      Pain Education --      Exclude from Growth Chart --     Most recent vital signs: Vitals:   04/07/24 2230 04/07/24 2300  BP: 108/64 (!) 105/59  Pulse: 100 99  Resp:  20  Temp:  98.4 F (36.9 C)  SpO2: 100% 100%    General: Awake, no distress.  CV:  Good peripheral perfusion.  Irregular rhythm, heart rate 70 Resp:  Normal effort.  Clear lungs Abd:  No distention.  Soft nontender Other:  Tenderness at right hip at the femoral neck   ED Results / Procedures / Treatments   Labs (all labs ordered are listed, but only abnormal results are displayed) Labs Reviewed  BASIC METABOLIC PANEL WITH GFR - Abnormal; Notable for the following components:      Result Value   Chloride 96 (*)    Glucose, Bld 191 (*)    BUN 42 (*)    Creatinine, Ser 5.80 (*)    GFR, Estimated 9 (*)    Anion gap 16 (*)    All other components within normal limits  CBC WITH DIFFERENTIAL/PLATELET - Abnormal; Notable for the following components:   RBC 3.64 (*)    Hemoglobin 11.3 (*)    HCT 34.4 (*)    RDW 18.0 (*)    Platelets 99 (*)    Lymphs Abs 0.6 (*)    All other components within normal limits     EKG Interpreted by me Atrial fibrillation rate of 101.  Right  axis, prolonged QTc of 525 ms.  No acute ischemic changes.   RADIOLOGY X-ray left hip without obvious fracture  CT head unremarkable CT cervical spine unremarkable CT left hip showing left pubic rami fractures and left acetabular fracture   PROCEDURES:  Procedures   MEDICATIONS ORDERED IN ED: Medications  morphine  (PF) 2 MG/ML injection 2 mg (2 mg Intravenous Given 04/07/24 2150)  morphine  (PF) 2 MG/ML injection 2 mg (2 mg Intravenous Given 04/07/24 2320)     IMPRESSION / MDM / ASSESSMENT AND PLAN / ED COURSE  I reviewed the triage vital signs and the nursing notes.  DDx: Left hip fracture, intracranial hemorrhage, C-spine fracture, electrolyte derangement, anemia  Patient's presentation is most consistent with acute presentation with potential threat to life or bodily function.  Patient presents with fall with left hip pain.  X-ray left hip nondiagnostic,pain and acute disability from left hip injury will obtain CT left hip along with head and cervical spine.   Clinical Course as of 04/07/24 2339  Sat Apr 07, 2024  2146 CT hip shows left acetabular fracture.  Discussed with orthopedics who recommends toe-touch weightbearing with rolling walker at all times.  Patient has chronic gait instability, elderly, significant comorbidities, will need to admit for further medical management and pain control and transitioning to SNF/rehab placement. [PS]    Clinical Course User Index [PS] Viviann Pastor, MD    ----------------------------------------- 11:39 PM on 04/07/2024 ----------------------------------------- Case discussed with hospitalist   FINAL CLINICAL IMPRESSION(S) / ED DIAGNOSES   Final diagnoses:  Closed nondisplaced fracture of left acetabulum, unspecified portion of acetabulum, initial encounter (HCC)  PAF (paroxysmal atrial fibrillation) (HCC)  ESRD on hemodialysis (HCC)     Rx / DC Orders   ED Discharge Orders     None        Note:  This  document was prepared using Conservation officer, historic buildings and may include unintentional dictation errors.   Viviann Pastor, MD 04/07/24 7660    Viviann Pastor, MD 04/07/24 [phone]

## 2024-04-07 NOTE — ED Notes (Signed)
 Family updated as to patient's status. Wife at bedside

## 2024-04-08 DIAGNOSIS — Z8679 Personal history of other diseases of the circulatory system: Secondary | ICD-10-CM

## 2024-04-08 DIAGNOSIS — S32592S Other specified fracture of left pubis, sequela: Secondary | ICD-10-CM | POA: Diagnosis not present

## 2024-04-08 DIAGNOSIS — I4819 Other persistent atrial fibrillation: Secondary | ICD-10-CM

## 2024-04-08 DIAGNOSIS — Z992 Dependence on renal dialysis: Secondary | ICD-10-CM

## 2024-04-08 DIAGNOSIS — E663 Overweight: Secondary | ICD-10-CM | POA: Insufficient documentation

## 2024-04-08 DIAGNOSIS — D696 Thrombocytopenia, unspecified: Secondary | ICD-10-CM | POA: Insufficient documentation

## 2024-04-08 DIAGNOSIS — I5022 Chronic systolic (congestive) heart failure: Secondary | ICD-10-CM

## 2024-04-08 DIAGNOSIS — N186 End stage renal disease: Secondary | ICD-10-CM | POA: Diagnosis not present

## 2024-04-08 DIAGNOSIS — S32592D Other specified fracture of left pubis, subsequent encounter for fracture with routine healing: Secondary | ICD-10-CM | POA: Diagnosis not present

## 2024-04-08 DIAGNOSIS — S32402S Unspecified fracture of left acetabulum, sequela: Secondary | ICD-10-CM | POA: Diagnosis not present

## 2024-04-08 DIAGNOSIS — I9589 Other hypotension: Secondary | ICD-10-CM

## 2024-04-08 DIAGNOSIS — W19XXXS Unspecified fall, sequela: Secondary | ICD-10-CM | POA: Diagnosis not present

## 2024-04-08 DIAGNOSIS — S32402D Unspecified fracture of left acetabulum, subsequent encounter for fracture with routine healing: Secondary | ICD-10-CM | POA: Diagnosis not present

## 2024-04-08 LAB — HEPATITIS B SURFACE ANTIGEN: Hepatitis B Surface Ag: NONREACTIVE

## 2024-04-08 MED ORDER — AMIODARONE HCL 200 MG PO TABS
400.0000 mg | ORAL_TABLET | Freq: Every day | ORAL | Status: DC
  Administered 2024-04-08 – 2024-04-12 (×5): 400 mg via ORAL
  Filled 2024-04-08 (×5): qty 2

## 2024-04-08 MED ORDER — HEPARIN SODIUM (PORCINE) 5000 UNIT/ML IJ SOLN
5000.0000 [IU] | Freq: Three times a day (TID) | INTRAMUSCULAR | Status: DC
  Administered 2024-04-08 – 2024-04-12 (×14): 5000 [IU] via SUBCUTANEOUS
  Filled 2024-04-08 (×14): qty 1

## 2024-04-08 MED ORDER — ALBUTEROL SULFATE (2.5 MG/3ML) 0.083% IN NEBU: 2.5000 mg | INHALATION_SOLUTION | RESPIRATORY_TRACT | Status: DC | PRN

## 2024-04-08 MED ORDER — HYDROCODONE-ACETAMINOPHEN 5-325 MG PO TABS: 1.0000 | ORAL_TABLET | ORAL | Status: DC | PRN

## 2024-04-08 MED ORDER — SODIUM CHLORIDE 0.9% FLUSH: 10.0000 mL | INTRAVENOUS | Status: DC | PRN

## 2024-04-08 MED ORDER — ATORVASTATIN CALCIUM 20 MG PO TABS
40.0000 mg | ORAL_TABLET | Freq: Every day | ORAL | Status: DC
  Administered 2024-04-08 – 2024-04-12 (×5): 40 mg via ORAL
  Filled 2024-04-08 (×5): qty 2

## 2024-04-08 MED ORDER — MORPHINE SULFATE (PF) 2 MG/ML IV SOLN: 1.0000 mg | INTRAVENOUS | Status: DC | PRN

## 2024-04-08 MED ORDER — NITROGLYCERIN 0.4 MG SL SUBL: 0.4000 mg | SUBLINGUAL_TABLET | SUBLINGUAL | Status: DC | PRN

## 2024-04-08 MED ORDER — CHLORHEXIDINE GLUCONATE CLOTH 2 % EX PADS
6.0000 | MEDICATED_PAD | Freq: Every day | CUTANEOUS | Status: DC
  Administered 2024-04-09 – 2024-04-12 (×4): 6 via TOPICAL

## 2024-04-08 MED ORDER — ACETAMINOPHEN 325 MG PO TABS
650.0000 mg | ORAL_TABLET | Freq: Four times a day (QID) | ORAL | Status: DC | PRN
  Administered 2024-04-09 – 2024-04-10 (×2): 650 mg via ORAL
  Filled 2024-04-08 (×2): qty 2

## 2024-04-08 MED ORDER — DOXEPIN HCL 10 MG PO CAPS
10.0000 mg | ORAL_CAPSULE | Freq: Every day | ORAL | Status: DC
  Administered 2024-04-08 – 2024-04-11 (×5): 10 mg via ORAL
  Filled 2024-04-08 (×6): qty 1

## 2024-04-08 MED ORDER — PANTOPRAZOLE SODIUM 40 MG PO TBEC
40.0000 mg | DELAYED_RELEASE_TABLET | Freq: Two times a day (BID) | ORAL | Status: DC
  Administered 2024-04-08 – 2024-04-12 (×9): 40 mg via ORAL
  Filled 2024-04-08 (×9): qty 1

## 2024-04-08 MED ORDER — ASPIRIN 81 MG PO TBEC
81.0000 mg | DELAYED_RELEASE_TABLET | Freq: Every day | ORAL | Status: DC
  Administered 2024-04-08 – 2024-04-12 (×5): 81 mg via ORAL
  Filled 2024-04-08 (×5): qty 1

## 2024-04-08 MED ORDER — SODIUM CHLORIDE 0.9% FLUSH
10.0000 mL | Freq: Two times a day (BID) | INTRAVENOUS | Status: DC
  Administered 2024-04-08 – 2024-04-11 (×7): 10 mL

## 2024-04-08 MED ORDER — HYDROCODONE-ACETAMINOPHEN 5-325 MG PO TABS
1.0000 | ORAL_TABLET | ORAL | Status: DC | PRN
  Filled 2024-04-08: qty 1

## 2024-04-08 MED ORDER — CHLORHEXIDINE GLUCONATE CLOTH 2 % EX PADS
6.0000 | MEDICATED_PAD | Freq: Every day | CUTANEOUS | Status: DC
  Administered 2024-04-08: 6 via TOPICAL

## 2024-04-08 MED ORDER — ACETAMINOPHEN 650 MG RE SUPP: 650.0000 mg | Freq: Four times a day (QID) | RECTAL | Status: DC | PRN

## 2024-04-08 MED ORDER — GABAPENTIN 100 MG PO CAPS
100.0000 mg | ORAL_CAPSULE | Freq: Every day | ORAL | Status: DC
  Administered 2024-04-08 – 2024-04-11 (×5): 100 mg via ORAL
  Filled 2024-04-08 (×5): qty 1

## 2024-04-08 MED ORDER — LEVOTHYROXINE SODIUM 50 MCG PO TABS
75.0000 ug | ORAL_TABLET | Freq: Every day | ORAL | Status: DC
  Administered 2024-04-08 – 2024-04-12 (×5): 75 ug via ORAL
  Filled 2024-04-08 (×5): qty 1

## 2024-04-08 MED ORDER — MIDODRINE HCL 5 MG PO TABS
5.0000 mg | ORAL_TABLET | Freq: Three times a day (TID) | ORAL | Status: DC
  Administered 2024-04-08 (×2): 5 mg via ORAL
  Filled 2024-04-08 (×3): qty 1

## 2024-04-08 MED ORDER — ENOXAPARIN SODIUM 40 MG/0.4ML IJ SOSY: 40.0000 mg | PREFILLED_SYRINGE | INTRAMUSCULAR | Status: DC

## 2024-04-08 MED ORDER — ONDANSETRON HCL 4 MG PO TABS: 4.0000 mg | ORAL_TABLET | Freq: Four times a day (QID) | ORAL | Status: DC | PRN

## 2024-04-08 MED ORDER — KETOROLAC TROMETHAMINE 15 MG/ML IJ SOLN
15.0000 mg | Freq: Four times a day (QID) | INTRAMUSCULAR | Status: DC | PRN
  Administered 2024-04-08: 15 mg via INTRAVENOUS
  Filled 2024-04-08: qty 1

## 2024-04-08 MED ORDER — MIDODRINE HCL 5 MG PO TABS
5.0000 mg | ORAL_TABLET | Freq: Once | ORAL | Status: AC
  Administered 2024-04-08: 5 mg via ORAL
  Filled 2024-04-08: qty 1

## 2024-04-08 MED ORDER — MIDODRINE HCL 5 MG PO TABS
10.0000 mg | ORAL_TABLET | Freq: Three times a day (TID) | ORAL | Status: DC
  Administered 2024-04-08 – 2024-04-12 (×13): 10 mg via ORAL
  Filled 2024-04-08 (×13): qty 2

## 2024-04-08 MED ORDER — ONDANSETRON HCL 4 MG/2ML IJ SOLN
4.0000 mg | Freq: Four times a day (QID) | INTRAMUSCULAR | Status: DC | PRN
  Administered 2024-04-09: 4 mg via INTRAVENOUS
  Filled 2024-04-08: qty 2

## 2024-04-08 NOTE — Progress Notes (Signed)
 OT Cancellation Note  Patient Details Name: Ronald [name] [facility] MRN: [id number] DOB: [birth year]   Cancelled Treatment:    Reason Eval/Treat Not Completed: Fatigue/lethargy limiting ability to participate. On 2 attempts today, pt very sleepy, difficulty keeping eyes open, low BP, and unable to get pain meds per RN. Will re -attempt OT evaluation next date as appropriate.   Rashmi Tallent R., MPH, MS, OTR/L ascom [phone] 04/08/24, 2:04 PM

## 2024-04-08 NOTE — Assessment & Plan Note (Addendum)
Dialysis done today

## 2024-04-08 NOTE — Assessment & Plan Note (Signed)
Chronic in nature.

## 2024-04-08 NOTE — Assessment & Plan Note (Signed)
 Ischemic cardiomyopathy, EF 20 to 25%, not an AICD candidate History of V. Tach GDMT intolerant due to chronic hypotension Clinically euvolemic

## 2024-04-08 NOTE — Progress Notes (Signed)
 Anticoagulation monitoring(Lovenox ):  78 yo male ordered Lovenox  40 mg Q24h    Filed Weights   04/07/24 1955  Weight: 91 kg (200 lb 9.9 oz)   BMI 29.6   Lab Results  Component Value Date   CREATININE 5.80 (H) 04/07/2024   CREATININE 5.86 (H) 11/08/2023   CREATININE 4.37 (H) 11/07/2023   Estimated Creatinine Clearance: 11.3 mL/min (A) (by C-G formula based on SCr of 5.8 mg/dL (H)). Hemoglobin & Hematocrit     Component Value Date/Time   HGB 11.3 (L) 04/07/2024 2002   HGB 9.9 (L) 05/19/2022 1548   HCT 34.4 (L) 04/07/2024 2002   HCT 30.4 (L) 05/19/2022 1548     Per Protocol for Patient with estCrcl < 15 ml/min and BMI < 30, will transition to Heparin  5000 units SQ Q8H.

## 2024-04-08 NOTE — Assessment & Plan Note (Signed)
 No acute concerns, no complaints of chest pain and EKG is nonacute Continue aspirin  and atorvastatin 

## 2024-04-08 NOTE — Consult Note (Signed)
 ORTHOPAEDIC CONSULTATION  REQUESTING PHYSICIAN: Josette Ade, MD  Chief Complaint:   Left pelvic fractures  History of Present Illness: Ronald [name] is a 78 y.o. 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  Patient reports he hit his head but denies loss of consciousness.  Denies any pre-existing left hip pain or pelvic pain prior to the fall.  Endorses left groin pain at this time and no pain in the left leg.  Denies any numbness or tingling or any other injuries.  Past Medical History:  Diagnosis Date   AAA (abdominal aortic aneurysm)    a. 3cm by US  2015; b. 07/2023 Ao U/S: Abd Ao 2.6cm.   Acute blood loss anemia (ABLA) 08/05/2023   Arthritis    hips; back (12/13/2014)   CAD (coronary artery disease)    a. 1996 s/p CABGx4: LIMA-LAD, VG-Cx, VG-RCA, VG-diag; b. 2007 s/p redo CABGx2: VG-OM, VG-RCA due to VG disease; c. NSTEMI 11/2014 s/p DES to SVG-OM from the Y graft; d. 09/2015 PCI: distal body of SVG-Diag; e. 07/2017 Cath: LAD 100p, LCX 100p, RCA 95ost/100p, VG->dRCA 10p ISR, 30p/m/d, Y graft->OM3 (ok) & D2 51m, patent distal stent, LIMA->LAD ok, VG->OM2 100, VG->RPAV 100-->Med rx.   Chronic combined systolic and diastolic CHF (congestive heart failure) (HCC)    a. remote EF 40-45% in 2006. b. Normal EF 2014. c. Echo 07/2016 EF 45-50%, grade 1 DD. d. Echo 2020 30% to 35%; e. 03/2023 Echo: EF 20-25%, glob HK, inf wall best preserved. Mildly reduced RV fxn, RVSP 39.53mmHg. Mod dil LA. Mod MR/TR. AoV sclerosis.   Chronic lower back pain    CKD (chronic kidney disease), stage IV (HCC)    COPD (chronic obstructive pulmonary disease) (HCC)    Deafness in left ear    Degenerative disc disease, lumbar    Diabetes mellitus, type 2 (HCC) 10/04/2014   Microalbumin  05/11/2012-100. Foot exam/monofilament 05/11/2012-normal.   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   Emphysema    Esophageal stricture 07/02/1998   EGD   Genital candidiasis in male 10/25/2012   GERD (gastroesophageal reflux disease)    History of gout    last flareup was in 2007 (12/13/2014)   History of hiatal hernia    Hyperlipidemia    Hypertension    Ischemic cardiomyopathy 2006   Echo 2020: EF 30-35%, diffuse hypokinesis   Ischemic cardiomyopathy    Myocardial infarction (HCC) 12/13/2014   Near syncope 08/05/2023   NSTEMI (non-ST elevated myocardial infarction) (HCC) 12/13/2014   PAF (paroxysmal atrial fibrillation) (HCC)    a. CHA2DS2VASc = 6-->eliquis .  On Amio.   PVC's (premature ventricular contractions)    Renal artery stenosis    a. noted on CT 2008.   Type II diabetes mellitus (HCC)    Diet control    Unstable angina (HCC) 07/27/2017   Walking pneumonia 1990's   Wears dentures    full upper   Past Surgical History:  Procedure Laterality Date   CARDIAC CATHETERIZATION  several   CARDIAC CATHETERIZATION N/A 12/13/2014   Procedure: Left Heart Cath and Coronary Angiography;  Surgeon: Candyce GORMAN Reek, MD;  Location: [facility] INVASIVE CV LAB;  Service: Cardiovascular;  Laterality: N/A;   CARDIAC CATHETERIZATION  12/13/2014   Procedure: Coronary Stent Intervention;  Surgeon: Candyce GORMAN Reek, MD;  Location[facility] INVASIVE CV LAB;  Service: Cardiovascular;;   CARDIAC CATHETERIZATION N/A  10/07/2015   Procedure: Left Heart Cath and Cors/Grafts Angiography;  Surgeon: Lonni JONETTA Cash, MD;  Location: [facility] INVASIVE CV LAB;  Service: Cardiovascular;  Laterality: N/A;   CARDIAC CATHETERIZATION N/A 10/07/2015   Procedure: Coronary Stent Intervention;  Surgeon: Lonni JONETTA Cash, MD;  Location: [facility] INVASIVE CV LAB;  Service: Cardiovascular;  Laterality: N/A;   CARDIOVERSION N/A 05/25/2022   Procedure: CARDIOVERSION;  Surgeon: Sheena Pugh, DO;  Location: [facility] ENDOSCOPY;   Service: Cardiovascular;  Laterality: N/A;   CATARACT EXTRACTION W/PHACO Left 10/21/2021   Procedure: CATARACT EXTRACTION PHACO AND INTRAOCULAR LENS PLACEMENT (IOC) LEFT 3.94 00:33.7;  Surgeon: Myrna Adine Anes, MD;  Location: [facility] SURGERY CNTR;  Service: Ophthalmology;  Laterality: Left;   CATARACT EXTRACTION W/PHACO Right 11/09/2021   Procedure: CATARACT EXTRACTION PHACO AND INTRAOCULAR LENS PLACEMENT (IOC) RIGHT;  Surgeon: Myrna Adine Anes, MD;  Location: [facility];  Service: Ophthalmology;  Laterality: Right;  3.59 0:29.4   CORONARY ANGIOPLASTY  several   CORONARY ANGIOPLASTY WITH STENT PLACEMENT  2005; 12/13/2014   2; 1   CORONARY ARTERY BYPASS GRAFT  05/31/1994   CABG X5   CORONARY ARTERY BYPASS GRAFT  07/29/2005   CABG X3   DIALYSIS/PERMA CATHETER INSERTION N/A 09/10/2022   Procedure: DIALYSIS/PERMA CATHETER INSERTION;  Surgeon: Jama Cordella MATSU, MD;  Location: [facility] INVASIVE CV LAB;  Service: Cardiovascular;  Laterality: N/A;   ESOPHAGOGASTRODUODENOSCOPY (EGD) WITH ESOPHAGEAL DILATION  05/31/1998   GREEN LIGHT LASER TURP (TRANSURETHRAL RESECTION OF PROSTATE  01/30/1999   not cancerous   HERNIA REPAIR     LAPAROSCOPIC CHOLECYSTECTOMY     LEFT HEART CATH AND CORS/GRAFTS ANGIOGRAPHY N/A 07/28/2017   Procedure: LEFT HEART CATH AND CORS/GRAFTS ANGIOGRAPHY;  Surgeon: Burnard Debby LABOR, MD;  Location: [facility] INVASIVE CV LAB;  Service: Cardiovascular;  Laterality: N/A;   LUNG SURGERY  05/31/1994   S/P CABG, had to put staple in lung after it had collapsed   MINOR REMOVAL OF PERITONEAL DIALYSIS CATHETER  10/2022   UMBILICAL HERNIA REPAIR     w/chole   Social History   Socioeconomic History   Marital status: Married    Spouse name: Ivar Domangue   Number of children: 2   Years of education: Not on file   Highest education level: 8th grade  Occupational History   Occupation: Retired  Tobacco Use   Smoking status: Former    Current packs/day: 0.00    Average  packs/day: 3.0 packs/day for 20.0 years (60.0 ttl pk-yrs)    Types: Cigarettes    Start date: 07/25/1966    Quit date: 07/25/1986    Years since quitting: 37.7   Smokeless tobacco: Never  Vaping Use   Vaping status: Never Used  Substance and Sexual Activity   Alcohol use: No    Alcohol/week: 0.0 standard drinks of alcohol   Drug use: No   Sexual activity: Not Currently  Other Topics Concern   Not on file  Social History Narrative   Did auto salvage work.   Lives at home with his wife.  Independent at baseline.   Social Drivers of Corporate Investment Banker Strain: Low Risk  (06/23/2023)   Overall Financial Resource Strain (CARDIA)    Difficulty of Paying Living Expenses: Not hard at all  Food Insecurity: No Food Insecurity (11/09/2023)   Hunger Vital Sign    Worried About Running Out of Food in the Last Year: Never true    Ran Out of Food in the Last Year: Never true  Transportation  Needs: No Transportation Needs (11/09/2023)   PRAPARE - Administrator, Civil Service (Medical): No    Lack of Transportation (Non-Medical): No  Physical Activity: Inactive (06/23/2023)   Exercise Vital Sign    Days of Exercise per Week: 0 days    Minutes of Exercise per Session: 0 min  Stress: No Stress Concern Present (06/23/2023)   [name] of Occupational Health - Occupational Stress Questionnaire    Feeling of Stress : Only a little  Social Connections: Moderately Isolated (11/07/2023)   Social Connection and Isolation Panel    Frequency of Communication with Friends and Family: Twice a week    Frequency of Social Gatherings with Friends and Family: Three times a week    Attends Religious Services: Never    Active Member of Clubs or Organizations: No    Attends Engineer, Structural: Never    Marital Status: Married   Family History  Problem Relation Age of Onset   Heart attack Mother        MI   Stroke Mother    Heart disease Mother    Hypertension Mother     Hyperlipidemia Mother    Asthma Mother    Heart disease Father    Rheumatic fever Father    Colon cancer Neg Hx    Allergies  Allergen Reactions   Predicort [Prednisolone] Other (See Comments)    Stomach pain   Ciprofloxacin Other (See Comments)    GI upset   Hydrochlorothiazide Other (See Comments)    Dehydration   Hydrocodone Nausea Only and Other (See Comments)    Stomach upset    Hydrocodone-Acetaminophen  Nausea Only    Stomach upset   Sulfa Antibiotics Other (See Comments)    Cannot recall   Penicillins Hives, Rash and Other (See Comments)    Has patient had a PCN reaction causing immediate rash, facial/tongue/throat swelling, SOB or lightheadedness with hypotension: YES  Has patient had a PCN reaction causing severe rash involving mucus membranes or skin necrosis: NO  Has patient had a PCN reaction that required hospitalization NO  Has patient had a PCN reaction occurring within the last 10 years:NO  If all of the above answers are NO, then may proceed with Cephalosporin use.  Has patient had a PCN reaction causing immediate rash, facial/tongue/throat swelling, SOB or lightheadedness with hypotension: YES Has patient had a PCN reaction causing severe rash involving mucus membranes or skin necrosis: NO Has patient had a PCN reaction that required hospitalization NO Has patient had a PCN reaction occurring within the last 10 years:NO If all of the above answers are NO, then may proceed with Cephalosporin use.   Prior to Admission medications   Medication Sig Start Date End Date Taking? Authorizing Provider  allopurinol  (ZYLOPRIM ) 100 MG tablet TAKE 1 TABLET BY MOUTH EVERYDAY AT BEDTIME 03/20/24   Berglund, Laura H, MD  amiodarone  (PACERONE ) 200 MG tablet Take 2 tablets (400 mg total) by mouth daily. 12/28/23   Verlin Lonni BIRCH, MD  aspirin  EC 81 MG tablet Take 1 tablet (81 mg total) by mouth daily. Swallow whole. 03/20/24   [name], MD  atorvastatin   (LIPITOR) 40 MG tablet Take 1 tablet (40 mg total) by mouth daily. 03/19/24   [name], MD  Cholecalciferol  (VITAMIN D -3) 125 MCG (5000 UT) TABS Take 2,000 Units by mouth daily.     [provider]  doxepin  (SINEQUAN ) 10 MG capsule TAKE 1 CAPSULE BY MOUTH AT BEDTIME. 03/23/24  [name], MD  furosemide  (LASIX ) 20 MG tablet Take 60 mg by mouth daily as needed for edema. 10/12/22   [provider]  gabapentin  (NEURONTIN ) 100 MG capsule Take 100 mg by mouth at bedtime. 01/30/24   [provider]  levothyroxine  (SYNTHROID ) 75 MCG tablet TAKE 1 TABLET BY MOUTH EVERY DAY BEFORE BREAKFAST 01/16/24   [name], MD  midodrine  (PROAMATINE ) 5 MG tablet Take 1 tablet (5 mg total) by mouth 3 (three) times daily with meals. 12/21/23   Berglund, Laura H, MD  Multiple Vitamin (MULTIVITAMIN PO) Take 1 tablet by mouth daily.    [provider]  nitroGLYCERIN  (NITROSTAT ) 0.4 MG SL tablet Place 0.4 mg under the tongue every 5 (five) minutes as needed for chest pain (Up to 3 times).    [provider]  ondansetron  (ZOFRAN ) 4 MG tablet Take 1 tablet (4 mg total) by mouth every 8 (eight) hours as needed for nausea or vomiting. 02/27/24   [name], MD  pantoprazole  (PROTONIX ) 40 MG tablet TAKE 1 TABLET (40 MG TOTAL) BY MOUTH 2 (TWO) TIMES DAILY. HOME MED. 01/20/24   Berglund, Laura H, MD  senna (SENOKOT) 8.6 MG tablet Take 1 tablet by mouth as needed for constipation.    [provider]  triamcinolone  0.1% oint-Cerave equivalent lotion 1:1 mixture Apply topically 2 (two) times daily. 03/30/24   [name], MD   CT Hip Left Wo Contrast Result Date: 04/07/2024 EXAM: CT OF THE LEFT HIP WITHOUT IV CONTRAST 04/07/2024 08:39:57 PM TECHNIQUE: CT of the left hip was performed without the administration of intravenous contrast. Multiplanar reformatted images are provided for review. Automated exposure control, iterative reconstruction, and/or  weight based adjustment of the mA/kV was utilized to reduce the radiation dose to as low as reasonably achievable. COMPARISON: None available. CLINICAL HISTORY: Hip trauma, fracture suspected, xray done. FINDINGS: BONES: Nondisplaced left superior pubic ramus fracture extending into the anterior acetabulum, mildly comminuted. Mildly displaced left inferior pubic ramus fracture. Left proximal femur is intact. No aggressive appearing osseous abnormality or periostitis. SOFT TISSUE: No significant soft tissue edema or fluid collections. No soft tissue mass. JOINT: No significant degenerative changes. No osseous erosions. INTRAPELVIC CONTENTS: Limited images of the intrapelvic contents are unremarkable. IMPRESSION: 1. Mildly comminuted left acetabular fracture. 2. Left superior and inferior pubic rami fractures, as above. 3. Left hip is intact. Electronically signed by: Pinkie Pebbles MD 04/07/2024 08:46 PM EST RP Workstation: [id number]   CT Cervical Spine Wo Contrast Result Date: 04/07/2024 EXAM: CT CERVICAL SPINE WITHOUT CONTRAST 04/07/2024 08:39:57 PM TECHNIQUE: CT of the cervical spine was performed without the administration of intravenous contrast. Multiplanar reformatted images are provided for review. Automated exposure control, iterative reconstruction, and/or weight based adjustment of the mA/kV was utilized to reduce the radiation dose to as low as reasonably achievable. COMPARISON: None available. CLINICAL HISTORY: Neck trauma (Age >= 65y) FINDINGS: CERVICAL SPINE: BONES AND ALIGNMENT: No acute fracture or traumatic malalignment. DEGENERATIVE CHANGES: No significant degenerative changes. SOFT TISSUES: No prevertebral soft tissue swelling. LUNGS: Mild right apical pleural parenchymal scarring. IMPRESSION: 1. No acute abnormality of the cervical spine. Electronically signed by: Pinkie Pebbles MD 04/07/2024 08:43 PM EST RP Workstation: [id number]   CT Head Wo Contrast Result Date: 04/07/2024 EXAM: CT  HEAD WITHOUT CONTRAST 04/07/2024 08:39:57 PM TECHNIQUE: CT of the head was performed without the administration of intravenous contrast. Automated exposure control, iterative reconstruction, and/or weight based adjustment of the mA/kV was utilized to reduce  the radiation dose to as low as reasonably achievable. COMPARISON: 09/29/2023. CLINICAL HISTORY: Head trauma, minor (Age >= 65y). FINDINGS: BRAIN AND VENTRICLES: No acute hemorrhage. No evidence of acute infarct. No hydrocephalus. No extra-axial collection. No mass effect or midline shift. Age-related atrophy. Mild periventricular chronic small vessel ischemia. Vascular calcifications. ORBITS: Bilateral cataract resection. SINUSES: No acute abnormality. SOFT TISSUES AND SKULL: No acute soft tissue abnormality. No skull fracture. IMPRESSION: 1. No acute intracranial abnormality. Electronically signed by: Pinkie Pebbles MD 04/07/2024 08:42 PM EST RP Workstation: [id number]   DG Hip Unilat W or Wo Pelvis 2-3 Views Left Result Date: 04/07/2024 EXAM: 2 or 3 VIEW(S) XRAY OF THE PELVIS AND LEFT HIP 04/07/2024 08:30:15 PM COMPARISON: X-rays 12/04/2014. CLINICAL HISTORY: Left hip pain after fall. FINDINGS: BONES: The bones are diffusely osteopenic. There is a healed left inferior pubic ramus fracture. The bones are diffusely osteopenic limiting evaluation for subtle nondisplaced fracture. No definite acute fracture identified. JOINTS: SI joints are symmetric. Bilateral hips demonstrate normal alignment. Joint spaces are maintained. No definite acute dislocation identified. SOFT TISSUES: There are peripheral vascular calcifications present. IMPRESSION: 1. No definite acute fracture or dislocation identified. 2. Diffuse osteopenia limits evaluation for subtle nondisplaced fracture. If there is high clinical concern for occult fracture, consider further evaluation with CT. 3. Healed left inferior pubic ramus fracture. Electronically signed by: Greig Pique MD 04/07/2024  08:42 PM EST RP Workstation: [id number]    Positive ROS: All other systems have been reviewed and were otherwise negative with the exception of those mentioned in the HPI and as above.  Physical Exam: General:  Alert, no acute distress Psychiatric:  Patient answers questions appropriately and follows exam Cardiovascular:  No pedal edema Respiratory:  No wheezing, non-labored breathing GI:  Abdomen is soft and non-tender Skin:  No lesions in the area of chief complaint Neurologic:  Sensation intact distally Lymphatic:  No axillary or cervical lymphadenopathy  Orthopedic Exam:  Left lower extremity Tender to palpation over the left pubic symphysis and ramus No tenderness over the hip, distal femur, knee, ankle foot or toes No pain with logroll or gentle axial load of the left leg Neurovascular intact distally with soft compartments  Secondary survey No tenderness to palpation over other bony prominences in the lower extremities or bilateral upper extremities No pain with logroll or simulated axial loading of the right lower extremity All compartments soft No tenderness to palpation over the cervical or thoracic spine, no bony step-off Motor grossly intact throughout, no focal deficits Sensation grossly intact throughout, no focal deficits Good distal pulses and capillary refill on all extremities   X-rays:  X-rays and CT scan images and report reviewed by myself full reads noted above.  Patient has a nondisplaced left inferior pubic ramus fracture and a left anterior column and superior dome extending into the superior pubic ramus fracture which is nondisplaced.  The posterior column and posterior wall appear intact.  No significant displacement no protrusio.  Agree with radiologist interpretation.  Assessment: Left inferior superior pubic ramus fractures and a left anterior column acetabular fracture  Plan: I reviewed the clinical and radiographic findings with the patient.  He  has a nondisplaced acetabular fracture however it is in the anterior column and extends into the dome of the acetabulum.  There is potential risk for displacement of this fracture.  In its current position the fracture should heal well as long as there is no displacement.  To help reduce any risk we will make him toe-touch  weightbearing with a rolling walker.  We did discuss should there be displacement on serial x-ray exam we may need referral to a trauma surgeon for evaluation for possible open reduction term fixation.  The risks of nonunion, delayed union, malunion, and the development of arthritis in his hip from this injury was discussed.  All the patient's questions were answered and he agrees to the above plan to follow-up in 10 to 14 days for repeat x-rays in the office.  No plan for orthopedic surgical intervention at this time    Arthea Sheer MD  Beeper #:  [phone]  04/08/2024 8:32 AM

## 2024-04-08 NOTE — Assessment & Plan Note (Addendum)
 PT and OT evaluations

## 2024-04-08 NOTE — Progress Notes (Addendum)
 PT Cancellation Note  Patient Details Name: Ronald [name] [facility] MRN: [id number] DOB: [birth year]   Cancelled Treatment:    Reason Eval/Treat Not Completed: Fatigue/lethargy limiting ability to participate;Pain limiting ability to participate. Patient reports 7-8/10 pain without pain medicine at rest in bed. RN unable to give pain medicine due to low BP. Patient is also very lethargic. Will re-attempt Eval tomorrow.      Ronald [name] 04/08/2024, 2:17 PM

## 2024-04-08 NOTE — Assessment & Plan Note (Addendum)
 Closed left acetabular fracture Nonoperative management, toe-touch left side approved by orthopedic.  Patient was lethargic and had low blood pressure so PT and OT did not see yesterday.  Patient down at dialysis this morning.  PT and OT consultation.  Await their recommendations.  Pain control.

## 2024-04-08 NOTE — Progress Notes (Signed)
 [facility]  ROUNDING NOTE   Subjective:   Ronald [name] is a 78 year old male with history of hypertension, coronary artery disease, atrial fibrillation, congestive heart failure, history of nonsustained V. tach and end-stage renal disease on hemodialysis now comes to the emergency room with a fall. Patient has been admitted under observation for PAF (paroxysmal atrial fibrillation) (HCC) [I48.0] ESRD on hemodialysis (HCC) [N18.6, Z99.2] Closed nondisplaced fracture of left acetabulum, unspecified portion of acetabulum, initial encounter (HCC) [S32.402A] Closed fracture of multiple pubic rami, initial encounter [facility]) [S32.599A]  Patient is known to our practice and receives outpatient dialysis treatments at [facility]. Last treatment received on Friday. Patient states he was out running errands and fell. States his legs went out of from under him. Denies hitting his head. Reports pain when he moves his left leg.   Labs unremarkable for renal patient. CT left hip shows mildly comminuted left acetabular fracture with left superior and inferior pubic rami fractures. Orthopedics consulted in ED.   We have been consulted to manage dialysis needs during this admission.    Objective:  Vital signs in last 24 hours:  Temp:  [97.6 F (36.4 C)-98.4 F (36.9 C)] 98.2 F (36.8 C) (11/09 0855) Pulse Rate:  [56-101] 95 (11/09 0855) Resp:  [14-20] 17 (11/09 0855) BP: (99-122)/(45-71) 99/45 (11/09 0855) SpO2:  [96 %-100 %] 96 % (11/09 0855) Weight:  [91 kg] 91 kg (11/08 1955)  Weight change:  Filed Weights   04/07/24 1955  Weight: 91 kg    Intake/Output: No intake/output data recorded.   Intake/Output this shift:  Total I/O In: 360 [P.O.:360] Out: -   Physical Exam: General: NAD  Head: Normocephalic, atraumatic. Moist oral mucosal membranes  Eyes: Anicteric  Lungs:  Clear,  [place] O2  Heart: Regular rate and rhythm  Abdomen:  Soft, nontender  Extremities:  2+  peripheral edema.  Neurologic: Awake, alert, conversant  Skin: Warm,dry, no rash  Access: Rt internal jugular permcath    Basic Metabolic Panel: Recent Labs  Lab 04/07/24 2002  NA 138  K 4.0  CL 96*  CO2 26  GLUCOSE 191*  BUN 42*  CREATININE 5.80*  CALCIUM  9.0    Liver Function Tests: No results for input(s): AST, ALT, ALKPHOS, BILITOT, PROT, ALBUMIN in the last 168 hours. No results for input(s): LIPASE, AMYLASE in the last 168 hours. No results for input(s): AMMONIA in the last 168 hours.  CBC: Recent Labs  Lab 04/07/24 2002  WBC 5.9  NEUTROABS 4.4  HGB 11.3*  HCT 34.4*  MCV 94.5  PLT 99*    Cardiac Enzymes: No results for input(s): CKTOTAL, CKMB, CKMBINDEX, TROPONINI in the last 168 hours.  BNP: Invalid input(s): POCBNP  CBG: No results for input(s): GLUCAP in the last 168 hours.  Microbiology: Results for orders placed or performed during the hospital encounter of 11/06/23  MRSA Next Gen by PCR, Nasal     Status: None   Collection Time: 11/07/23 10:47 AM   Specimen: Nasal Mucosa; Nasal Swab  Result Value Ref Range Status   MRSA by PCR Next Gen NOT DETECTED NOT DETECTED Final    Comment: (NOTE) The GeneXpert MRSA Assay (FDA approved for NASAL specimens only), is one component of a comprehensive MRSA colonization surveillance program. It is not intended to diagnose MRSA infection nor to guide or monitor treatment for MRSA infections. Test performance is not FDA approved in patients less than 39 years old. Performed at [facility], [address] [place], [place] [address]     Coagulation Studies: No results for input(s): LABPROT, INR in the last 72 hours.  Urinalysis: No results for input(s): COLORURINE, LABSPEC, PHURINE, GLUCOSEU, HGBUR, BILIRUBINUR, KETONESUR, PROTEINUR, UROBILINOGEN, NITRITE, LEUKOCYTESUR in the last 72 hours.  Invalid input(s): APPERANCEUR     Imaging: CT Hip Left Wo Contrast Result Date: 04/07/2024 EXAM: CT OF THE LEFT HIP WITHOUT IV CONTRAST 04/07/2024 08:39:57 PM TECHNIQUE: CT of the left hip was performed without the administration of intravenous contrast. Multiplanar reformatted images are provided for review. Automated exposure control, iterative reconstruction, and/or weight based adjustment of the mA/kV was utilized to reduce the radiation dose to as low as reasonably achievable. COMPARISON: None available. CLINICAL HISTORY: Hip trauma, fracture suspected, xray done. FINDINGS: BONES: Nondisplaced left superior pubic ramus fracture extending into the anterior acetabulum, mildly comminuted. Mildly displaced left inferior pubic ramus fracture. Left proximal femur is intact. No aggressive appearing osseous abnormality or periostitis. SOFT TISSUE: No significant soft tissue edema or fluid collections. No soft tissue mass. JOINT: No significant degenerative changes. No osseous erosions. INTRAPELVIC CONTENTS: Limited images of the intrapelvic contents are unremarkable. IMPRESSION: 1. Mildly comminuted left acetabular fracture. 2. Left superior and inferior pubic rami fractures, as above. 3. Left hip is intact. Electronically signed by: Pinkie Pebbles MD 04/07/2024 08:46 PM EST RP Workstation: [id number]   CT Cervical Spine Wo Contrast Result Date: 04/07/2024 EXAM: CT CERVICAL SPINE WITHOUT CONTRAST 04/07/2024 08:39:57 PM TECHNIQUE: CT of the cervical spine was performed without the administration of intravenous contrast. Multiplanar reformatted images are provided for review. Automated exposure control, iterative reconstruction, and/or weight based adjustment of the mA/kV was utilized to reduce the radiation dose to as low as reasonably achievable. COMPARISON: None available. CLINICAL HISTORY: Neck trauma (Age >= 65y) FINDINGS: CERVICAL SPINE: BONES AND ALIGNMENT: No acute fracture or traumatic malalignment. DEGENERATIVE CHANGES: No  significant degenerative changes. SOFT TISSUES: No prevertebral soft tissue swelling. LUNGS: Mild right apical pleural parenchymal scarring. IMPRESSION: 1. No acute abnormality of the cervical spine. Electronically signed by: Pinkie Pebbles MD 04/07/2024 08:43 PM EST RP Workstation: [id number]   CT Head Wo Contrast Result Date: 04/07/2024 EXAM: CT HEAD WITHOUT CONTRAST 04/07/2024 08:39:57 PM TECHNIQUE: CT of the head was performed without the administration of intravenous contrast. Automated exposure control, iterative reconstruction, and/or weight based adjustment of the mA/kV was utilized to reduce the radiation dose to as low as reasonably achievable. COMPARISON: 09/29/2023. CLINICAL HISTORY: Head trauma, minor (Age >= 65y). FINDINGS: BRAIN AND VENTRICLES: No acute hemorrhage. No evidence of acute infarct. No hydrocephalus. No extra-axial collection. No mass effect or midline shift. Age-related atrophy. Mild periventricular chronic small vessel ischemia. Vascular calcifications. ORBITS: Bilateral cataract resection. SINUSES: No acute abnormality. SOFT TISSUES AND SKULL: No acute soft tissue abnormality. No skull fracture. IMPRESSION: 1. No acute intracranial abnormality. Electronically signed by: Pinkie Pebbles MD 04/07/2024 08:42 PM EST RP Workstation: [id number]   DG Hip Unilat W or Wo Pelvis 2-3 Views Left Result Date: 04/07/2024 EXAM: 2 or 3 VIEW(S) XRAY OF THE PELVIS AND LEFT HIP 04/07/2024 08:30:15 PM COMPARISON: X-rays 12/04/2014. CLINICAL HISTORY: Left hip pain after fall. FINDINGS: BONES: The bones are diffusely osteopenic. There is a healed left inferior pubic ramus fracture. The bones are diffusely osteopenic limiting evaluation for subtle nondisplaced fracture. No definite acute fracture identified. JOINTS: SI joints are symmetric. Bilateral hips demonstrate normal alignment. Joint spaces are maintained. No definite acute dislocation identified. SOFT TISSUES: There are peripheral vascular  calcifications present. IMPRESSION: 1. No  definite acute fracture or dislocation identified. 2. Diffuse osteopenia limits evaluation for subtle nondisplaced fracture. If there is high clinical concern for occult fracture, consider further evaluation with CT. 3. Healed left inferior pubic ramus fracture. Electronically signed by: Greig Pique MD 04/07/2024 08:42 PM EST RP Workstation: [id number]     Medications:     amiodarone   400 mg Oral Daily   aspirin  EC  81 mg Oral Daily   atorvastatin   40 mg Oral Daily   Chlorhexidine  Gluconate Cloth  6 each Topical Daily   doxepin   10 mg Oral QHS   gabapentin   100 mg Oral QHS   heparin  injection (subcutaneous)  5,000 Units Subcutaneous Q8H   levothyroxine   75 mcg Oral Q0600   midodrine   5 mg Oral TID WC   pantoprazole   40 mg Oral BID   sodium chloride  flush  10-40 mL Intracatheter Q12H   acetaminophen  **OR** acetaminophen , albuterol , HYDROcodone-acetaminophen , ketorolac, morphine  injection, nitroGLYCERIN , ondansetron  **OR** ondansetron  (ZOFRAN ) IV, sodium chloride  flush  Assessment/ Plan:  Mr. TORELL MINDER is a 78 y.o.  male with history of hypertension, coronary artery disease, atrial fibrillation, congestive heart failure, history of nonsustained V. tach and end-stage renal disease on hemodialysis now comes to the emergency room with a fall. Patient has been admitted under observation for PAF (paroxysmal atrial fibrillation) (HCC) [I48.0] ESRD on hemodialysis (HCC) [N18.6, Z99.2] Closed nondisplaced fracture of left acetabulum, unspecified portion of acetabulum, initial encounter (HCC) [S32.402A] Closed fracture of multiple pubic rami, initial encounter (HCC) [S32.599A]  [facility] MWF/Rt Permcath  Fracture, closed. Pubic rami fracture with left acetabular fracture. Seen on CT. Orthopedics consulted, no surgical intervention. Continue pain management and PT  2. End stage renal disease on hemodialysis. Last treatment on Friday. Will plan  for dialysis on Monday. He will need to be able to sit in a chair prior to discharge for outpatient dialysis.   3. Anemia of chronic kidney disease Lab Results  Component Value Date   HGB 11.3 (L) 04/07/2024    Hgb stable, no need for ESA  4. Secondary Hyperparathyroidism: with outpatient labs: None available  Lab Results  Component Value Date   CALCIUM  9.0 04/07/2024   PHOS 7.2 (H) 08/08/2023    Will continue to monitor bone minerals.    LOS: 0 Isaah Furry 11/9/202511:23 AM

## 2024-04-08 NOTE — Assessment & Plan Note (Addendum)
 Not currently on anticoagulation Continue amiodarone  and aspirin 

## 2024-04-08 NOTE — Care Management Obs Status (Signed)
 MEDICARE OBSERVATION STATUS NOTIFICATION   Patient Details  Name: Ronald [name] MRN: [id number] Date of Birth: 1945/12/12   Medicare Observation Status Notification Given:  Yes    Rojelio SHAUNNA Rattler 04/08/2024, 6:13 PM

## 2024-04-08 NOTE — Assessment & Plan Note (Signed)
 No acute issues, pain control

## 2024-04-08 NOTE — Hospital Course (Signed)
 78 y.o. 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 tach on amiodarone , not on AC, currently being followed by palliative care, being admitted with a left acetabular and pubic rami fracture, resulting from a presumed accidental fall in a parking lot when his legs just  gave out.  Denies hitting his head or passing out.  Denied preceding chest pain, palpitations or shortness of breath or lightheadedness, visual disturbance or headache. In the ED, vitals unremarkable CBC notable forBaseline anemia of 11.3 and baseline thrombocytopenia of 99 BMP in keeping with dialysis status but otherwise unremarkable EKG showed A-fib at 101 with nonspecific T wave changes   Trauma imaging including CT head C-spine which were nonacute CT hip showed a left acetabular fracture as well as left superior and inferior pubic rami fractures   The ED provider spoke with orthopedics who said non operative  for now, recommended toe-touch weightbearing and rolling walker at all times   Got a couple of doses of morphine  in the ED   Admission requested for pain control and to arrange for SNF/rehab    11/9.  Patient unable to lift his left leg up off the bed.  Will see how he does with physical therapy.  Seen by orthopedic surgery and gave the okay for toe-touch left foot.  Pain control. 11/10.  Patient seen down on dialysis was feeling okay except for pain in his left groin/hip area. 11/11.  Physical therapy recommending rehab.  Nephrology notified me that he will have to be able to sit up in order to receive outpatient dialysis.  Patient still having pain in his left groin but today able to bend his left knee but I am able to straight leg raise.

## 2024-04-08 NOTE — Assessment & Plan Note (Signed)
No acute issues suspected at this time

## 2024-04-08 NOTE — Plan of Care (Signed)
 Patient is pleasant, Alert and oriented x4, HOH. Diminished/clear lung sounds, on 2L [place], baseline on room air no noted cough. Abdomen soft, last bowel movement 11/7, +bowel sounds. Patient had decreased urine output r/t he is a dialysis patient but able to voide without any difficulty. +CMS, able to feel sensation BLE, patient able to wiggle toes,+dorsi/plantar flex, denies numbness or tingling, patients LLE slightly externally rotated r/t fracture. Tolerating diet. PRN pain medications given with good affect resulting in reduced pain level. Scattered bruising noted and skin tears to BUE covered in mepilexes. Hourly rounding performed, fall precautions maintained, and call bell within reach.   Problem: Education: Goal: Knowledge of General Education information will improve Description: Including pain rating scale, medication(s)/side effects and non-pharmacologic comfort measures Outcome: Not Progressing   Problem: Health Behavior/Discharge Planning: Goal: Ability to manage health-related needs will improve Outcome: Not Progressing   Problem: Clinical Measurements: Goal: Ability to maintain clinical measurements within normal limits will improve Outcome: Not Progressing Goal: Will remain free from infection Outcome: Not Progressing Goal: Diagnostic test results will improve Outcome: Not Progressing Goal: Respiratory complications will improve Outcome: Not Progressing Goal: Cardiovascular complication will be avoided Outcome: Not Progressing   Problem: Activity: Goal: Risk for activity intolerance will decrease Outcome: Not Progressing   Problem: Nutrition: Goal: Adequate nutrition will be maintained Outcome: Not Progressing   Problem: Coping: Goal: Level of anxiety will decrease Outcome: Not Progressing   Problem: Elimination: Goal: Will not experience complications related to bowel motility Outcome: Not Progressing Goal: Will not experience complications related to urinary  retention Outcome: Not Progressing   Problem: Pain Managment: Goal: General experience of comfort will improve and/or be controlled Outcome: Not Progressing   Problem: Safety: Goal: Ability to remain free from injury will improve Outcome: Not Progressing   Problem: Skin Integrity: Goal: Risk for impaired skin integrity will decrease Outcome: Not Progressing

## 2024-04-08 NOTE — Assessment & Plan Note (Signed)
 BMI 29.63 with current height and weight in computer.

## 2024-04-08 NOTE — Assessment & Plan Note (Addendum)
 Continue midodrine  at increased dose 10 mg 3 times daily.

## 2024-04-08 NOTE — Progress Notes (Signed)
 Progress Note   Patient: Ronald [name] [facility] FMW:[id number] DOB: December 16, 1945 DOA: 04/07/2024     0 DOS: the patient was seen and examined on 04/08/2024   Brief hospital course: 78 y.o. 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 tach on amiodarone , not on AC, currently being followed by palliative care, being admitted with a left acetabular and pubic rami fracture, resulting from a presumed accidental fall in a parking lot when his legs just  gave out.  Denies hitting his head or passing out.  Denied preceding chest pain, palpitations or shortness of breath or lightheadedness, visual disturbance or headache. In the ED, vitals unremarkable CBC notable forBaseline anemia of 11.3 and baseline thrombocytopenia of 99 BMP in keeping with dialysis status but otherwise unremarkable EKG showed A-fib at 101 with nonspecific T wave changes   Trauma imaging including CT head C-spine which were nonacute CT hip showed a left acetabular fracture as well as left superior and inferior pubic rami fractures   The ED provider spoke with orthopedics who said non operative  for now, recommended toe-touch weightbearing and rolling walker at all times   Got a couple of doses of morphine  in the ED   Admission requested for pain control and to arrange for SNF/rehab    11/9.  Patient unable to lift his left leg up off the bed.  Will see how he does with physical therapy.  Seen by orthopedic surgery and gave the okay for toe-touch left foot.  Pain control.  Assessment and Plan: * Closed fracture of multiple pubic rami, left, sequela Closed left acetabular fracture Nonoperative management, toe-touch left side approved by orthopedic.  PT and OT consultation.  Await their recommendations.   Ground level fall,  undetermined but presumed accidental PT and OT evaluations  Chronic HFrEF (heart failure with reduced ejection fraction) (HCC) Ischemic cardiomyopathy, EF 20 to 25%, not an AICD candidate History of V. Tach GDMT intolerant due to chronic hypotension Clinically euvolemic   Persistent atrial fibrillation (HCC) Not currently on anticoagulation Continue amiodarone  and aspirin   CAD S/P CABG (1998, 2007), subsequent stents No acute concerns, no complaints of chest pain and EKG is nonacute Continue aspirin  and atorvastatin   Chronic hypotension Continue midodrine  at increased dose 10 mg 3 times daily.  ESRD (end stage renal disease) on dialysis [facility]) Appreciate nephrology consultation will set up for dialysis tomorrow.  Overweight BMI 29.63 with current height and weight in computer.  Thrombocytopenia Chronic in nature  Thoracic aortic aneurysm (TAA) No acute issues suspected at this time.  Spinal stenosis of lumbar region without neurogenic claudication No acute issues, pain control        Subjective: Patient unable to move his left leg.  States he had a fall.  Found to have left pubic rami fractures and left acetabular fracture.  Physical Exam: Vitals:   04/08/24 0042 04/08/24 0241 04/08/24 0855 04/08/24 1205  BP: 122/71 (!) 118/58 (!) 99/45 (!) 86/48  Pulse: (!) 56 99 95 86  Resp: 15 16 17    Temp: 97.6 F (36.4 C) 97.7 F (36.5 C) 98.2 F (36.8 C)   TempSrc:   Oral   SpO2: 96% 97% 96% 97%  Weight:      Height:       Physical Exam HENT:     Head: Normocephalic.  Mouth/Throat:     Pharynx: No oropharyngeal exudate.  Eyes:     General: Lids are normal.     Conjunctiva/sclera: Conjunctivae normal.  Cardiovascular:     Rate and Rhythm: Normal rate and regular rhythm.     Heart sounds: S1 normal and S2 normal. Murmur heard.     Systolic murmur is present with a grade of 2/6.  Pulmonary:     Breath sounds: No decreased breath sounds, wheezing, rhonchi or  rales.  Abdominal:     Palpations: Abdomen is soft.     Tenderness: There is no abdominal tenderness.  Musculoskeletal:     Right lower leg: Swelling present.     Left lower leg: Swelling present.  Skin:    General: Skin is warm.     Findings: No rash.  Neurological:     Mental Status: He is alert and oriented to person, place, and time.     Comments: Able to straight leg raise with right leg but unable to move left leg.  Able to flex and extend at the left ankle.     Data Reviewed: Creatinine 5.8 with a GFR of 9, white blood cell count 5.9, hemoglobin 11.3, platelet count 99  Family Communication: Updated wife on the phone  Disposition: Status is: Observation We will see how he does with PT on whether they recommend rehab or not.  Pain control.  Planned Discharge Destination: Rehab    Time spent: 28 minutes  Author: Charlie Patterson, MD 04/08/2024 12:44 PM  For on call review [email or web address].

## 2024-04-09 DIAGNOSIS — D631 Anemia in chronic kidney disease: Secondary | ICD-10-CM | POA: Diagnosis present

## 2024-04-09 DIAGNOSIS — E663 Overweight: Secondary | ICD-10-CM

## 2024-04-09 DIAGNOSIS — E785 Hyperlipidemia, unspecified: Secondary | ICD-10-CM | POA: Diagnosis present

## 2024-04-09 DIAGNOSIS — S32432A Displaced fracture of anterior column [iliopubic] of left acetabulum, initial encounter for closed fracture: Secondary | ICD-10-CM | POA: Diagnosis present

## 2024-04-09 DIAGNOSIS — Z992 Dependence on renal dialysis: Secondary | ICD-10-CM

## 2024-04-09 DIAGNOSIS — Z8679 Personal history of other diseases of the circulatory system: Secondary | ICD-10-CM

## 2024-04-09 DIAGNOSIS — I5042 Chronic combined systolic (congestive) and diastolic (congestive) heart failure: Secondary | ICD-10-CM | POA: Diagnosis present

## 2024-04-09 DIAGNOSIS — N186 End stage renal disease: Secondary | ICD-10-CM

## 2024-04-09 DIAGNOSIS — I5022 Chronic systolic (congestive) heart failure: Secondary | ICD-10-CM | POA: Diagnosis not present

## 2024-04-09 DIAGNOSIS — J439 Emphysema, unspecified: Secondary | ICD-10-CM | POA: Diagnosis present

## 2024-04-09 DIAGNOSIS — I9589 Other hypotension: Secondary | ICD-10-CM

## 2024-04-09 DIAGNOSIS — Z961 Presence of intraocular lens: Secondary | ICD-10-CM | POA: Diagnosis present

## 2024-04-09 DIAGNOSIS — S32592D Other specified fracture of left pubis, subsequent encounter for fracture with routine healing: Secondary | ICD-10-CM

## 2024-04-09 DIAGNOSIS — M51369 Other intervertebral disc degeneration, lumbar region without mention of lumbar back pain or lower extremity pain: Secondary | ICD-10-CM | POA: Diagnosis present

## 2024-04-09 DIAGNOSIS — I2510 Atherosclerotic heart disease of native coronary artery without angina pectoris: Secondary | ICD-10-CM | POA: Diagnosis present

## 2024-04-09 DIAGNOSIS — Y9301 Activity, walking, marching and hiking: Secondary | ICD-10-CM | POA: Diagnosis present

## 2024-04-09 DIAGNOSIS — D696 Thrombocytopenia, unspecified: Secondary | ICD-10-CM

## 2024-04-09 DIAGNOSIS — S32592A Other specified fracture of left pubis, initial encounter for closed fracture: Secondary | ICD-10-CM | POA: Diagnosis present

## 2024-04-09 DIAGNOSIS — I4819 Other persistent atrial fibrillation: Secondary | ICD-10-CM | POA: Diagnosis present

## 2024-04-09 DIAGNOSIS — S32402A Unspecified fracture of left acetabulum, initial encounter for closed fracture: Secondary | ICD-10-CM | POA: Diagnosis not present

## 2024-04-09 DIAGNOSIS — S32402S Unspecified fracture of left acetabulum, sequela: Secondary | ICD-10-CM | POA: Diagnosis not present

## 2024-04-09 DIAGNOSIS — D6959 Other secondary thrombocytopenia: Secondary | ICD-10-CM | POA: Diagnosis present

## 2024-04-09 DIAGNOSIS — Z66 Do not resuscitate: Secondary | ICD-10-CM | POA: Diagnosis present

## 2024-04-09 DIAGNOSIS — S32592S Other specified fracture of left pubis, sequela: Secondary | ICD-10-CM | POA: Diagnosis not present

## 2024-04-09 DIAGNOSIS — W010XXA Fall on same level from slipping, tripping and stumbling without subsequent striking against object, initial encounter: Secondary | ICD-10-CM | POA: Diagnosis present

## 2024-04-09 DIAGNOSIS — S32402D Unspecified fracture of left acetabulum, subsequent encounter for fracture with routine healing: Secondary | ICD-10-CM | POA: Diagnosis not present

## 2024-04-09 DIAGNOSIS — I420 Dilated cardiomyopathy: Secondary | ICD-10-CM | POA: Diagnosis present

## 2024-04-09 DIAGNOSIS — M48061 Spinal stenosis, lumbar region without neurogenic claudication: Secondary | ICD-10-CM | POA: Diagnosis present

## 2024-04-09 DIAGNOSIS — H9192 Unspecified hearing loss, left ear: Secondary | ICD-10-CM | POA: Diagnosis present

## 2024-04-09 DIAGNOSIS — E1122 Type 2 diabetes mellitus with diabetic chronic kidney disease: Secondary | ICD-10-CM | POA: Diagnosis present

## 2024-04-09 DIAGNOSIS — I132 Hypertensive heart and chronic kidney disease with heart failure and with stage 5 chronic kidney disease, or end stage renal disease: Secondary | ICD-10-CM | POA: Diagnosis present

## 2024-04-09 DIAGNOSIS — I714 Abdominal aortic aneurysm, without rupture, unspecified: Secondary | ICD-10-CM | POA: Diagnosis present

## 2024-04-09 DIAGNOSIS — E871 Hypo-osmolality and hyponatremia: Secondary | ICD-10-CM | POA: Diagnosis present

## 2024-04-09 DIAGNOSIS — I480 Paroxysmal atrial fibrillation: Secondary | ICD-10-CM | POA: Diagnosis present

## 2024-04-09 DIAGNOSIS — I712 Thoracic aortic aneurysm, without rupture, unspecified: Secondary | ICD-10-CM | POA: Diagnosis present

## 2024-04-09 MED ORDER — HEPARIN SODIUM (PORCINE) 1000 UNIT/ML IJ SOLN
INTRAMUSCULAR | Status: AC
  Filled 2024-04-09: qty 3

## 2024-04-09 MED ORDER — POLYETHYLENE GLYCOL 3350 17 G PO PACK
17.0000 g | PACK | Freq: Every day | ORAL | Status: DC
  Administered 2024-04-09: 17 g via ORAL
  Filled 2024-04-09: qty 1

## 2024-04-09 NOTE — Progress Notes (Signed)
 PT Cancellation Note  Patient Details Name: Ronald [name] [facility] MRN: [id number] DOB: Sep 21, 1945   Cancelled Treatment:    Reason Eval/Treat Not Completed: Patient at procedure or test/unavailable, will attempt to see pt at a future date/time as medically appropriate.     [name] PT, DPT 04/09/24, 9:05 AM

## 2024-04-09 NOTE — Progress Notes (Signed)
 [facility]  ROUNDING NOTE   Subjective:   Ronald [name] is a 78 year old male with history of hypertension, coronary artery disease, atrial fibrillation, congestive heart failure, history of nonsustained V. tach and end-stage renal disease on hemodialysis now comes to the emergency room with a fall. Patient has been admitted under observation for PAF (paroxysmal atrial fibrillation) (HCC) [I48.0] ESRD on hemodialysis (HCC) [N18.6, Z99.2] Closed nondisplaced fracture of left acetabulum, unspecified portion of acetabulum, initial encounter (HCC) [S32.402A] Closed fracture of multiple pubic rami, initial encounter [facility]) [S32.599A]  Patient is known to our practice and receives outpatient dialysis treatments at [facility].   Update: Patient seen and evaluated during dialysis   HEMODIALYSIS FLOWSHEET:  Blood Flow Rate (mL/min): 400 mL/min Arterial Pressure (mmHg): -195.75 mmHg Venous Pressure (mmHg): 176.76 mmHg TMP (mmHg): 9.9 mmHg Ultrafiltration Rate (mL/min): 635 mL/min Dialysate Flow Rate (mL/min): 299 ml/min  Tolerating treatment well States he is pain free while not moving  Objective:  Vital signs in last 24 hours:  Temp:  [97.8 F (36.6 C)-98.6 F (37 C)] 98 F (36.7 C) (11/10 0748) Pulse Rate:  [50-104] 86 (11/10 1030) Resp:  [11-17] 15 (11/10 1030) BP: (86-116)/(40-65) 107/57 (11/10 1030) SpO2:  [92 %-100 %] 96 % (11/10 1030) Weight:  [90.6 kg] 90.6 kg (11/10 0748)  Weight change:  Filed Weights   04/07/24 1955 04/09/24 0748  Weight: 91 kg 90.6 kg    Intake/Output: I/O last 3 completed shifts: In: 1080 [P.O.:1080] Out: -    Intake/Output this shift:  No intake/output data recorded.  Physical Exam: General: NAD  Head: Normocephalic, atraumatic. Moist oral mucosal membranes  Eyes: Anicteric  Lungs:  Clear,  Segundo O2  Heart: Regular rate and rhythm  Abdomen:  Soft, nontender  Extremities:  2+ peripheral edema.  Neurologic: Awake, alert,  conversant  Skin: Warm,dry, no rash  Access: Rt internal jugular permcath    Basic Metabolic Panel: Recent Labs  Lab 04/07/24 2002  NA 138  K 4.0  CL 96*  CO2 26  GLUCOSE 191*  BUN 42*  CREATININE 5.80*  CALCIUM  9.0    Liver Function Tests: No results for input(s): AST, ALT, ALKPHOS, BILITOT, PROT, ALBUMIN in the last 168 hours. No results for input(s): LIPASE, AMYLASE in the last 168 hours. No results for input(s): AMMONIA in the last 168 hours.  CBC: Recent Labs  Lab 04/07/24 2002  WBC 5.9  NEUTROABS 4.4  HGB 11.3*  HCT 34.4*  MCV 94.5  PLT 99*    Cardiac Enzymes: No results for input(s): CKTOTAL, CKMB, CKMBINDEX, TROPONINI in the last 168 hours.  BNP: Invalid input(s): POCBNP  CBG: No results for input(s): GLUCAP in the last 168 hours.  Microbiology: Results for orders placed or performed during the hospital encounter of 11/06/23  MRSA Next Gen by PCR, Nasal     Status: None   Collection Time: 11/07/23 10:47 AM   Specimen: Nasal Mucosa; Nasal Swab  Result Value Ref Range Status   MRSA by PCR Next Gen NOT DETECTED NOT DETECTED Final    Comment: (NOTE) The GeneXpert MRSA Assay (FDA approved for NASAL specimens only), is one component of a comprehensive MRSA colonization surveillance program. It is not intended to diagnose MRSA infection nor to guide or monitor treatment for MRSA infections. Test performance is not FDA approved in patients less than 60 years old. Performed at [facility], [address] [place], [place] [address]     Coagulation Studies: No results for input(s):  LABPROT, INR in the last 72 hours.  Urinalysis: No results for input(s): COLORURINE, LABSPEC, PHURINE, GLUCOSEU, HGBUR, BILIRUBINUR, KETONESUR, PROTEINUR, UROBILINOGEN, NITRITE, LEUKOCYTESUR in the last 72 hours.  Invalid input(s): APPERANCEUR    Imaging: CT Hip Left Wo Contrast Result Date:  04/07/2024 EXAM: CT OF THE LEFT HIP WITHOUT IV CONTRAST 04/07/2024 08:39:57 PM TECHNIQUE: CT of the left hip was performed without the administration of intravenous contrast. Multiplanar reformatted images are provided for review. Automated exposure control, iterative reconstruction, and/or weight based adjustment of the mA/kV was utilized to reduce the radiation dose to as low as reasonably achievable. COMPARISON: None available. CLINICAL HISTORY: Hip trauma, fracture suspected, xray done. FINDINGS: BONES: Nondisplaced left superior pubic ramus fracture extending into the anterior acetabulum, mildly comminuted. Mildly displaced left inferior pubic ramus fracture. Left proximal femur is intact. No aggressive appearing osseous abnormality or periostitis. SOFT TISSUE: No significant soft tissue edema or fluid collections. No soft tissue mass. JOINT: No significant degenerative changes. No osseous erosions. INTRAPELVIC CONTENTS: Limited images of the intrapelvic contents are unremarkable. IMPRESSION: 1. Mildly comminuted left acetabular fracture. 2. Left superior and inferior pubic rami fractures, as above. 3. Left hip is intact. Electronically signed by: Pinkie Pebbles MD 04/07/2024 08:46 PM EST RP Workstation: [id number]   CT Cervical Spine Wo Contrast Result Date: 04/07/2024 EXAM: CT CERVICAL SPINE WITHOUT CONTRAST 04/07/2024 08:39:57 PM TECHNIQUE: CT of the cervical spine was performed without the administration of intravenous contrast. Multiplanar reformatted images are provided for review. Automated exposure control, iterative reconstruction, and/or weight based adjustment of the mA/kV was utilized to reduce the radiation dose to as low as reasonably achievable. COMPARISON: None available. CLINICAL HISTORY: Neck trauma (Age >= 65y) FINDINGS: CERVICAL SPINE: BONES AND ALIGNMENT: No acute fracture or traumatic malalignment. DEGENERATIVE CHANGES: No significant degenerative changes. SOFT TISSUES: No  prevertebral soft tissue swelling. LUNGS: Mild right apical pleural parenchymal scarring. IMPRESSION: 1. No acute abnormality of the cervical spine. Electronically signed by: Pinkie Pebbles MD 04/07/2024 08:43 PM EST RP Workstation: [id number]   CT Head Wo Contrast Result Date: 04/07/2024 EXAM: CT HEAD WITHOUT CONTRAST 04/07/2024 08:39:57 PM TECHNIQUE: CT of the head was performed without the administration of intravenous contrast. Automated exposure control, iterative reconstruction, and/or weight based adjustment of the mA/kV was utilized to reduce the radiation dose to as low as reasonably achievable. COMPARISON: 09/29/2023. CLINICAL HISTORY: Head trauma, minor (Age >= 65y). FINDINGS: BRAIN AND VENTRICLES: No acute hemorrhage. No evidence of acute infarct. No hydrocephalus. No extra-axial collection. No mass effect or midline shift. Age-related atrophy. Mild periventricular chronic small vessel ischemia. Vascular calcifications. ORBITS: Bilateral cataract resection. SINUSES: No acute abnormality. SOFT TISSUES AND SKULL: No acute soft tissue abnormality. No skull fracture. IMPRESSION: 1. No acute intracranial abnormality. Electronically signed by: Pinkie Pebbles MD 04/07/2024 08:42 PM EST RP Workstation: [id number]   DG Hip Unilat W or Wo Pelvis 2-3 Views Left Result Date: 04/07/2024 EXAM: 2 or 3 VIEW(S) XRAY OF THE PELVIS AND LEFT HIP 04/07/2024 08:30:15 PM COMPARISON: X-rays 12/04/2014. CLINICAL HISTORY: Left hip pain after fall. FINDINGS: BONES: The bones are diffusely osteopenic. There is a healed left inferior pubic ramus fracture. The bones are diffusely osteopenic limiting evaluation for subtle nondisplaced fracture. No definite acute fracture identified. JOINTS: SI joints are symmetric. Bilateral hips demonstrate normal alignment. Joint spaces are maintained. No definite acute dislocation identified. SOFT TISSUES: There are peripheral vascular calcifications present. IMPRESSION: 1. No definite  acute fracture or dislocation identified. 2. Diffuse osteopenia limits evaluation for subtle nondisplaced  fracture. If there is high clinical concern for occult fracture, consider further evaluation with CT. 3. Healed left inferior pubic ramus fracture. Electronically signed by: Greig Pique MD 04/07/2024 08:42 PM EST RP Workstation: [id number]     Medications:     amiodarone   400 mg Oral Daily   aspirin  EC  81 mg Oral Daily   atorvastatin   40 mg Oral Daily   Chlorhexidine  Gluconate Cloth  6 each Topical Q0600   doxepin   10 mg Oral QHS   gabapentin   100 mg Oral QHS   heparin  injection (subcutaneous)  5,000 Units Subcutaneous Q8H   levothyroxine   75 mcg Oral Q0600   midodrine   10 mg Oral TID WC   pantoprazole   40 mg Oral BID   sodium chloride  flush  10-40 mL Intracatheter Q12H   acetaminophen  **OR** acetaminophen , albuterol , HYDROcodone-acetaminophen , ketorolac, morphine  injection, nitroGLYCERIN , ondansetron  **OR** ondansetron  (ZOFRAN ) IV, sodium chloride  flush  Assessment/ Plan:  [name] is a 78 y.o.  male with history of hypertension, coronary artery disease, atrial fibrillation, congestive heart failure, history of nonsustained V. tach and end-stage renal disease on hemodialysis now comes to the emergency room with a fall. Patient has been admitted under observation for PAF (paroxysmal atrial fibrillation) (HCC) [I48.0] ESRD on hemodialysis (HCC) [N18.6, Z99.2] Closed nondisplaced fracture of left acetabulum, unspecified portion of acetabulum, initial encounter (HCC) [S32.402A] Closed fracture of multiple pubic rami, initial encounter (HCC) [S32.599A]  [facility] MWF/Rt Permcath  Fracture, closed. Pubic rami fracture with left acetabular fracture. Seen on CT. Orthopedics consulted, no surgical intervention. Continue pain management and PT  2. End stage renal disease on hemodialysis. Receiving treatment today, UF 1.5L as tolerated. Next treatment scheduled for  Wednesday.   3. Anemia of chronic kidney disease Lab Results  Component Value Date   HGB 11.3 (L) 04/07/2024    Hgb at goal. ESA on hold  4. Secondary Hyperparathyroidism: with outpatient labs: None available  Lab Results  Component Value Date   CALCIUM  9.0 04/07/2024   PHOS 7.2 (H) 08/08/2023  Calcium  stable, will obtain updated labs in am Will continue to monitor bone minerals.    LOS: 0 Velvet Moomaw 11/10/202510:45 AM

## 2024-04-09 NOTE — Evaluation (Signed)
 Physical Therapy Evaluation Patient Details Name: Ronald [name] [facility] MRN: [id number] DOB: May 13, 1946 Today's Date: 04/09/2024  History of Present Illness  Pt is a 78 y.o. 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 tach on amiodarone , not on AC, HTN, hx of CABG, and COPD. MD assessment includes: Closed fracture of multiple pubic rami and closed left acetabular fracture due to mechanical fall.    Clinical Impression  Pt was pleasant but anxious with high pain avoidance behavior and required motivation throughout the session but ultimately put forth fair effort. On arrival, pt did not report any pain and was on RA. Pt was educated on TTWB precautions. Pt was able to mobilize to EOB with +2 Max A for BLE and trunk control. Pt given multi-modal cuing along with physical assist to prevent LLE WB noncompliance during transfer training with +2 Mod A needed both to come to standing as well as for stability while in standing. Pt cued to attempt RLE shuffle steps at the EOB but was unable to do so.  Pt's SpO2 dropped to 86% after standing and was place on 2L O2 per nursing. Pt will benefit from continued PT services upon discharge to safely address deficits listed in patient problem list for decreased caregiver assistance and eventual return to PLOF.       If plan is discharge home, recommend the following: A lot of help with walking and/or transfers;A lot of help with bathing/dressing/bathroom;Assist for transportation;Assistance with cooking/housework;Help with stairs or ramp for entrance   Can travel by private vehicle        Equipment Recommendations    Recommendations for Other Services       Functional Status Assessment Patient has had a recent decline in their functional status and  demonstrates the ability to make significant improvements in function in a reasonable and predictable amount of time.     Precautions / Restrictions Precautions Precautions: Fall Recall of Precautions/Restrictions: Impaired Restrictions Weight Bearing Restrictions Per Provider Order: Yes LLE Weight Bearing Per Provider Order: Touchdown weight bearing      Mobility  Bed Mobility Overal bed mobility: Needs Assistance Bed Mobility: Supine to Sit     Supine to sit: Max assist, +2 for physical assistance     General bed mobility comments: pt needed assistance with positioning LE and UE support to achieve sitting balance EOB    Transfers Overall transfer level: Needs assistance Equipment used: Rolling walker (2 wheels) Transfers: Sit to/from Stand Sit to Stand: +2 physical assistance, Mod assist           General transfer comment: Needed physical assistance maintaining TTWB precautions --keeping the L LE off the ground. Needed UE support and multi-modal cuing throughout    Ambulation/Gait               General Gait Details: Pt cues for shuffle steps at EOB but unable to do so  Stairs            Wheelchair Mobility     Tilt Bed    Modified Rankin (Stroke Patients Only)       Balance Overall balance assessment: Needs assistance Sitting-balance support: Single extremity supported Sitting balance-Leahy Scale: Fair Sitting balance - Comments: needed UE support to maintain steadiness Postural control: Posterior lean Standing balance support: Bilateral upper extremity supported Standing  balance-Leahy Scale: Poor Standing balance comment: Required physical assist to maintain TTWB, mod A UE support to maintain standing balance. Pt was shaky on his R foot, unable to support himself with UE to maintain balance ind                             Pertinent Vitals/Pain Pain Assessment Pain Assessment: Faces Faces Pain Scale: No hurt Pain Location: L  hip/LLE Pain Descriptors / Indicators: Sore, Aching, Guarding, Discomfort Pain Intervention(s): Monitored during session, Limited activity within patient's tolerance, Repositioned    Home Living Family/patient expects to be discharged to:: Private residence Living Arrangements: Spouse/significant other;Children Available Help at Discharge: Family;Available 24 hours/day Type of Home: House Home Access: Stairs to enter Entrance Stairs-Rails: Right;Left;Can reach both Entrance Stairs-Number of Steps: 5   Home Layout: One level Home Equipment: BSC/3in1;Cane - quad;Shower Counsellor (2 wheels)      Prior Function Prior Level of Function : Independent/Modified Independent             Mobility Comments: able to ambulate with RW in the home, sleeps in recliner, uses it rollator to get to back porch and in the communty. His wife reports not using RW in the home for the past month. ADLs Comments: Did not need any assistance with ADLs     Extremity/Trunk Assessment             Cervical / Trunk Assessment Cervical / Trunk Assessment: Kyphotic  Communication   Communication Communication: Impaired Factors Affecting Communication: Hearing impaired    Cognition Arousal: Alert Behavior During Therapy: WFL for tasks assessed/performed   PT - Cognitive impairments: No apparent impairments                         Following commands: Intact       Cueing Cueing Techniques: Verbal cues, Gestural cues, Tactile cues, Visual cues     General Comments      Exercises     Assessment/Plan    PT Assessment Patient needs continued PT services  PT Problem List Decreased strength;Decreased range of motion;Decreased activity tolerance;Decreased balance;Decreased mobility;Decreased coordination;Decreased knowledge of use of DME;Decreased safety awareness;Decreased knowledge of precautions;Pain       PT Treatment Interventions DME instruction;Gait training;Stair  training;Functional mobility training;Therapeutic activities;Patient/family education;Balance training;Therapeutic exercise    PT Goals (Current goals can be found in the Care Plan section)  Acute Rehab PT Goals Patient Stated Goal: to get back home and ADLs PT Goal Formulation: With patient/family Time For Goal Achievement: 04/22/24 Potential to Achieve Goals: Fair    Frequency Min 3X/week     Co-evaluation PT/OT/SLP Co-Evaluation/Treatment: Yes Reason for Co-Treatment: For patient/therapist safety;To address functional/ADL transfers PT goals addressed during session: Balance;Mobility/safety with mobility OT goals addressed during session: ADL's and self-care       AM-PAC PT 6 Clicks Mobility  Outcome Measure Help needed turning from your back to your side while in a flat bed without using bedrails?: A Lot Help needed moving from lying on your back to sitting on the side of a flat bed without using bedrails?: A Lot Help needed moving to and from a bed to a chair (including a wheelchair)?: A Lot Help needed standing up from a chair using your arms (e.g., wheelchair or bedside chair)?: A Lot Help needed to walk in hospital room?: Total Help needed climbing 3-5 steps with a railing? : Total 6 Click Score:  10    End of Session Equipment Utilized During Treatment: Gait belt Activity Tolerance: Patient limited by pain;Patient limited by fatigue Patient left: in bed;with call bell/phone within reach;with bed alarm set;with family/visitor present   PT Visit Diagnosis: Unsteadiness on feet (R26.81);Muscle weakness (generalized) (M62.81);Other abnormalities of gait and mobility (R26.89);History of falling (Z91.81);Pain Pain - Right/Left: Left Pain - part of body: Hip    Time: 0311-0348 PT Time Calculation (min) (ACUTE ONLY): 37 min   Charges:                Corean Newport, SPT 04/09/24, 5:38 PM

## 2024-04-09 NOTE — Evaluation (Signed)
 Occupational Therapy Evaluation Patient Details Name: Ronald [name] [facility] MRN: [id number] DOB: [birth year] Today's Date: 04/09/2024   History of Present Illness   Pt is a 78 year old male currently being followed by palliative care, being admitted with a left acetabular and pubic rami fracture, resulting from a presumed accidental fall in a parking lot when his legs just  gave out.  He is to be TTWB with use of RW. PMH of coronary artery disease status post CABG x 2, ischemic cardiomyopathy with left ventricular ejection fraction of 20 to 25%, persistent a-fib, DM2, ESRD on hemodialysis, GERD, COPD.     Clinical Impressions Pt was seen for OT/PT evaluations this date to maximize pt/therapist safety. PTA pt lives in a one level home with his wife with 5  STE with bil HR. At baseline, he ambulates with or without a RW within the home and limited community distances with a RW or rollator. He is MOD I to Min A with ADL performance, mostly requiring assist when getting in the shower and with LB dressing.   Pt presents with deficits in strength, balance, pain management, hypotension, and safety awareness, affecting safe and optimal ADL completion. Pt currently requires MAX A x2 for all bed mobility tasks including trunkal elevation and BLE management. He was able to perform seated grooming tasks at EOB with SBA to CGA with mild BUE tremors. He stood from EOB x1 trial requiring Mod A X2 with increased time, bed height elevated, cues for hand placement, blocking of LLE to maintain TTWB following education with poor initial carryover. Heavy UE reliance on RW with tremors and weakness limited standing tolerance. Pt with desat to 80's on RA during session with nurse notified and placed on 2L 02 via [place] with improvement to 93% at rest. Pt became nauseous and noted with emesis at end of session with nurse aware. He is far from his baseline function at this time and will benefit from skilled OT services to address noted  impairments and functional limitations. He will be followed acutely and require follow up OT services upon DC.     If plan is discharge home, recommend the following:   A lot of help with bathing/dressing/bathroom;Two people to help with walking and/or transfers;Help with stairs or ramp for entrance     Functional Status Assessment   Patient has had a recent decline in their functional status and demonstrates the ability to make significant improvements in function in a reasonable and predictable amount of time.     Equipment Recommendations   Other (comment) (defer to next venue)     Recommendations for Other Services         Precautions/Restrictions   Precautions Precautions: Fall Recall of Precautions/Restrictions: Impaired Restrictions Weight Bearing Restrictions Per Provider Order: Yes LLE Weight Bearing Per Provider Order: Touchdown weight bearing     Mobility Bed Mobility Overal bed mobility: Needs Assistance Bed Mobility: Supine to Sit, Sit to Supine     Supine to sit: Max assist, +2 for physical assistance, HOB elevated, Used rails Sit to supine: Max assist, +2 for physical assistance   General bed mobility comments: assist for BLE management and trunkal elevation with use of chux pad to reach EOB safely    Transfers Overall transfer level: Needs assistance Equipment used: Rolling walker (2 wheels) Transfers: Sit to/from Stand Sit to Stand: +2 physical assistance, Mod assist, From elevated surface           General transfer comment: bed height elevated  and increased assist x2 to stand from EOB to RW with cues for hand placement and maintaining TTWB on LLE; heavy reliance on RW to stand for limited time with tremors noted in BUEs      Balance Overall balance assessment: Needs assistance Sitting-balance support: Single extremity supported Sitting balance-Leahy Scale: Fair Sitting balance - Comments: CGA d/t tremors while seated EOB    Standing balance support: Bilateral upper extremity supported, Reliant on assistive device for balance Standing balance-Leahy Scale: Poor Standing balance comment: +2 external support and foot blocking to maintain TTWB in LLE; RW use with heavy UE reliance however noted with BUE tremors and weakness                           ADL either performed or assessed with clinical judgement   ADL Overall ADL's : Needs assistance/impaired     Grooming: Wash/dry face;Set up;Sitting               Lower Body Dressing: Total assistance;Maximal assistance;Bed level Lower Body Dressing Details (indicate cue type and reason): anticipate                     Vision         Perception         Praxis         Pertinent Vitals/Pain Pain Assessment Pain Assessment: 0-10 Faces Pain Scale: Hurts a little bit Pain Location: L hip/LLE-none at rest Pain Descriptors / Indicators: Sore Pain Intervention(s): Monitored during session, Premedicated before session, Limited activity within patient's tolerance     Extremity/Trunk Assessment Upper Extremity Assessment Upper Extremity Assessment: Generalized weakness   Lower Extremity Assessment Lower Extremity Assessment: Generalized weakness;LLE deficits/detail LLE Deficits / Details: TTWB with fxs, notable weakness   Cervical / Trunk Assessment Cervical / Trunk Assessment: Kyphotic   Communication Communication Communication: Impaired Factors Affecting Communication: Hearing impaired   Cognition Arousal: Alert Behavior During Therapy: WFL for tasks assessed/performed Cognition: No apparent impairments                               Following commands: Intact       Cueing  General Comments   Cueing Techniques: Verbal cues;Gestural cues;Tactile cues;Visual cues  pt became nauseous and lightheaded with return to bed and provided emesis bag with x1 instance of emesis, also noted with sp02 readings in the  80s after activity with nurse notified and pt placed on 2L with sp02 at 93%   Exercises Other Exercises Other Exercises: Edu on role of OT in acute setting.   Shoulder Instructions      Home Living Family/patient expects to be discharged to:: Private residence Living Arrangements: Spouse/significant other;Children Available Help at Discharge: Family;Available 24 hours/day Type of Home: House Home Access: Stairs to enter [employer] of Steps: 5 Entrance Stairs-Rails: Right;Left;Can reach both Home Layout: One level     Bathroom Shower/Tub: Walk-in shower;Sponge bathes at baseline   [employer]: Handicapped height Bathroom Accessibility: Yes   Home Equipment: BSC/3in1;Cane - quad;Shower Counsellor (2 wheels)          Prior Functioning/Environment Prior Level of Function : Independent/Modified Independent             Mobility Comments: able to ambulate with RW in the home, sleeps in recliner, uses it rollator to get to back porch and in the communty. His wife reports  not using RW in the home for the past month. ADLs Comments: Did not need any assistance with ADLs    OT Problem List: Decreased strength;Decreased activity tolerance;Impaired balance (sitting and/or standing);Pain   OT Treatment/Interventions: Self-care/ADL training;Therapeutic exercise;Balance training;Therapeutic activities;DME and/or AE instruction;Patient/family education      OT Goals(Current goals can be found in the care plan section)   Acute Rehab OT Goals Patient Stated Goal: get better OT Goal Formulation: With patient/family Time For Goal Achievement: 04/23/24 Potential to Achieve Goals: Fair ADL Goals Pt Will Perform Lower Body Bathing: with contact guard assist;with min assist;sit to/from stand;sitting/lateral leans Pt Will Perform Lower Body Dressing: with min assist;sitting/lateral leans;sit to/from stand Pt Will Transfer to Toilet: with min assist;stand pivot  transfer;ambulating   OT Frequency:  Min 2X/week    Co-evaluation PT/OT/SLP Co-Evaluation/Treatment: Yes Reason for Co-Treatment: For patient/therapist safety;To address functional/ADL transfers PT goals addressed during session: Balance;Mobility/safety with mobility OT goals addressed during session: ADL's and self-care      AM-PAC OT 6 Clicks Daily Activity     Outcome Measure Help from another person eating meals?: A Little Help from another person taking care of personal grooming?: A Little Help from another person toileting, which includes using toliet, bedpan, or urinal?: A Lot Help from another person bathing (including washing, rinsing, drying)?: A Lot Help from another person to put on and taking off regular upper body clothing?: A Little Help from another person to put on and taking off regular lower body clothing?: A Lot 6 Click Score: 15   End of Session Equipment Utilized During Treatment: Gait belt;Rolling walker (2 wheels);Oxygen Nurse Communication: Mobility status  Activity Tolerance: Patient tolerated treatment well;Patient limited by fatigue;Patient limited by pain Patient left: in bed;with call bell/phone within reach;with bed alarm set;with family/visitor present  OT Visit Diagnosis: Other abnormalities of gait and mobility (R26.89);Muscle weakness (generalized) (M62.81)                Time: 8489-8457 OT Time Calculation (min): 32 min Charges:  OT General Charges $OT Visit: 1 Visit OT Evaluation $OT Eval Moderate Complexity: 1 Mod Latisia Hilaire, OTR/L 04/09/24, 5:40 PM  Akshar Starnes E Leontyne [place] 04/09/2024, 5:34 PM

## 2024-04-09 NOTE — Progress Notes (Signed)
 Progress Note   Patient: Terrace Fontanilla [facility] FMW:[id number] DOB: 1946/02/27 DOA: 04/07/2024     0 DOS: the patient was seen and examined on 04/09/2024   Brief hospital course: 78 y.o. 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 tach on amiodarone , not on AC, currently being followed by palliative care, being admitted with a left acetabular and pubic rami fracture, resulting from a presumed accidental fall in a parking lot when his legs just  gave out.  Denies hitting his head or passing out.  Denied preceding chest pain, palpitations or shortness of breath or lightheadedness, visual disturbance or headache. In the ED, vitals unremarkable CBC notable forBaseline anemia of 11.3 and baseline thrombocytopenia of 99 BMP in keeping with dialysis status but otherwise unremarkable EKG showed A-fib at 101 with nonspecific T wave changes   Trauma imaging including CT head C-spine which were nonacute CT hip showed a left acetabular fracture as well as left superior and inferior pubic rami fractures   The ED provider spoke with orthopedics who said non operative  for now, recommended toe-touch weightbearing and rolling walker at all times   Got a couple of doses of morphine  in the ED   Admission requested for pain control and to arrange for SNF/rehab    11/9.  Patient unable to lift his left leg up off the bed.  Will see how he does with physical therapy.  Seen by orthopedic surgery and gave the okay for toe-touch left foot.  Pain control. 11/10.  Patient seen down on dialysis was feeling okay except for pain in his left groin/hip area.  Assessment and Plan: * Closed fracture of multiple pubic rami, left, sequela Closed left acetabular fracture Nonoperative management, toe-touch left side approved  by orthopedic.  Patient was lethargic and had low blood pressure so PT and OT did not see yesterday.  Patient down at dialysis this morning.  PT and OT consultation.  Await their recommendations.  Pain control.   Ground level fall, undetermined but presumed accidental PT and OT evaluations  Chronic hypotension Continue midodrine  at increased dose 10 mg 3 times daily.  Chronic HFrEF (heart failure with reduced ejection fraction) (HCC) Ischemic cardiomyopathy, EF 20 to 25%, not an AICD candidate History of V. Tach GDMT intolerant due to chronic hypotension Clinically euvolemic   Persistent atrial fibrillation (HCC) Not currently on anticoagulation Continue amiodarone  and aspirin   CAD S/P CABG (1998, 2007), subsequent stents No acute concerns, no complaints of chest pain and EKG is nonacute Continue aspirin  and atorvastatin   ESRD (end stage renal disease) on dialysis [facility]) Dialysis done today  Overweight BMI 28.45 with current height and weight in computer.  Thrombocytopenia Chronic in nature  Thoracic aortic aneurysm (TAA) No acute issues suspected at this time.  Spinal stenosis of lumbar region without neurogenic claudication No acute issues, pain control        Subjective: Patient seen this morning on dialysis.  Feeling okay besides left groin hip pain.  Patient admitted for fall and found to have pubic rami fractures.  Physical Exam: Vitals:   04/09/24 1126 04/09/24 1130 04/09/24 1143 04/09/24 1215  BP: (!) 123/56 (!) 114/58  (!) 114/52  Pulse: 87 89  98  Resp: 13 14    Temp: 97.6 F (36.4 C)   98.4 F (36.9 C)  TempSrc: Oral     SpO2: 96% 96%  96%  Weight: 88.7 kg  87.4 kg   Height:       Physical Exam HENT:     Head: Normocephalic.     Mouth/Throat:     Pharynx: No oropharyngeal exudate.  Eyes:     General: Lids are normal.     Conjunctiva/sclera: Conjunctivae normal.  Cardiovascular:     Rate and Rhythm: Normal rate and regular rhythm.      Heart sounds: S1 normal and S2 normal. Murmur heard.     Systolic murmur is present with a grade of 2/6.  Pulmonary:     Breath sounds: No decreased breath sounds, wheezing, rhonchi or rales.  Abdominal:     Palpations: Abdomen is soft.     Tenderness: There is no abdominal tenderness.  Musculoskeletal:     Right lower leg: Swelling present.     Left lower leg: Swelling present.  Skin:    General: Skin is warm.     Findings: No rash.  Neurological:     Mental Status: He is alert and oriented to person, place, and time.     Comments: Able to straight leg raise with right leg but unable to move left leg.  Able to flex and extend at the left ankle.     Data Reviewed: Creatinine 5.8, potassium 4.0, hemoglobin 11.3, platelet count 99, white blood count 5.9  Family Communication: Spoke with wife on phone  Disposition: Status is: Observation Awaiting PT and OT evaluations.  Patient and any pain while trying to move left leg and unable to straight leg raise with left leg so I assume he is going to need rehab.  Planned Discharge Destination: Rehab    Time spent: 27 minutes  Author: Charlie Patterson, MD 04/09/2024 3:33 PM  For on call review [email or web address].

## 2024-04-09 NOTE — Plan of Care (Addendum)
 Patient is HOH, alert and oriented x4. Clear lung sounds, on room air no noted cough. Abdomen soft, last bowel movement 11/7, +bowel sounds. Diminished urine output r/t patient is a dialysis patient. +CMS, able to feel sensation, patient able to wiggle toes,+dorsi/plantar flex, denies numbness or tingling. Tolerating diet. Ambulating 1 assist with front wheel walker out of bed with toe touch weight bearing. Patients BP 89/49 MAP 56 and HR 79, Dr. Cleatus notified. Hourly rounding performed, fall precautions maintained, and call bell within reach.  0500 BP 101/50 MAP 65 HR 83 and patient more alert this morning  Problem: Education: Goal: Knowledge of General Education information will improve Description: Including pain rating scale, medication(s)/side effects and non-pharmacologic comfort measures Outcome: Not Progressing   Problem: Health Behavior/Discharge Planning: Goal: Ability to manage health-related needs will improve Outcome: Not Progressing   Problem: Clinical Measurements: Goal: Ability to maintain clinical measurements within normal limits will improve Outcome: Not Progressing Goal: Will remain free from infection Outcome: Not Progressing Goal: Diagnostic test results will improve Outcome: Not Progressing Goal: Respiratory complications will improve Outcome: Not Progressing Goal: Cardiovascular complication will be avoided Outcome: Not Progressing   Problem: Activity: Goal: Risk for activity intolerance will decrease Outcome: Not Progressing   Problem: Nutrition: Goal: Adequate nutrition will be maintained Outcome: Not Progressing   Problem: Coping: Goal: Level of anxiety will decrease Outcome: Not Progressing   Problem: Elimination: Goal: Will not experience complications related to bowel motility Outcome: Not Progressing Goal: Will not experience complications related to urinary retention Outcome: Not Progressing   Problem: Pain Managment: Goal: General  experience of comfort will improve and/or be controlled Outcome: Not Progressing   Problem: Safety: Goal: Ability to remain free from injury will improve Outcome: Not Progressing   Problem: Skin Integrity: Goal: Risk for impaired skin integrity will decrease Outcome: Not Progressing

## 2024-04-09 NOTE — Progress Notes (Signed)
 OT Cancellation Note  Patient Details Name: Ronald [name] [facility] MRN: [id number] DOB: Jun 07, 1945   Cancelled Treatment:    Reason Eval/Treat Not Completed: Other (comment). Pt is OTF currently for dialysis, will re-attempt evaluations this afternoon as pt appropriate/available.  Ronald [name] 04/09/2024, 7:50 AM

## 2024-04-09 NOTE — Progress Notes (Signed)
 Last BM 11/07, [place]  given as ordered

## 2024-04-09 NOTE — Progress Notes (Signed)
   04/09/24 1126  Vitals  Temp 97.6 F (36.4 C)  Temp Source Oral  BP (!) 123/56  MAP (mmHg) 75  BP Location Right Arm  BP Method Automatic  Patient Position (if appropriate) Lying  Pulse Rate 87 (Simultaneous filing. User may not have seen previous data.)  Pulse Rate Source Monitor  ECG Heart Rate 92 (Simultaneous filing. User may not have seen previous data.)  Resp 13 (Simultaneous filing. User may not have seen previous data.)  Oxygen Therapy  SpO2 96 % (Simultaneous filing. User may not have seen previous data.)  During Treatment Monitoring  Blood Flow Rate (mL/min) 199 mL/min (Simultaneous filing. User may not have seen previous data.)  Arterial Pressure (mmHg) -93.93 mmHg (Simultaneous filing. User may not have seen previous data.)  Venous Pressure (mmHg) 51.91 mmHg (Simultaneous filing. User may not have seen previous data.)  TMP (mmHg) 9.9 mmHg (Simultaneous filing. User may not have seen previous data.)  Ultrafiltration Rate (mL/min) 639 mL/min (Simultaneous filing. User may not have seen previous data.)  Dialysate Flow Rate (mL/min) 300 ml/min (Simultaneous filing. User may not have seen previous data.)  Dialysate Potassium Concentration 3 (Simultaneous filing. User may not have seen previous data.)  Dialysate Calcium  Concentration 2.5 (Simultaneous filing. User may not have seen previous data.)  Duration of HD Treatment -hour(s) 3.25 hour(s) (Simultaneous filing. User may not have seen previous data.)  Cumulative Fluid Removed (mL) per Treatment   --   HD Safety Checks Performed Yes  Intra-Hemodialysis Comments Tx completed  Post Treatment  Dialyzer Clearance Clear (Simultaneous filing. User may not have seen previous data.)  Hemodialysis Intake (mL) 0 mL  Liters Processed 78  Fluid Removed (mL) 1500 mL (Simultaneous filing. User may not have seen previous data.)  Tolerated HD Treatment Yes (Simultaneous filing. User may not have seen previous data.)   Post-Hemodialysis Comments Tx has been tolerated. Goal of has been met. Vs have remained stable. Access dressing has been changed. Dressing is clean, dry and intact. Patient has no verbalized concerns. No medication has been given during this HD session.

## 2024-04-10 DIAGNOSIS — I5022 Chronic systolic (congestive) heart failure: Secondary | ICD-10-CM

## 2024-04-10 DIAGNOSIS — E663 Overweight: Secondary | ICD-10-CM

## 2024-04-10 DIAGNOSIS — S32402A Unspecified fracture of left acetabulum, initial encounter for closed fracture: Secondary | ICD-10-CM

## 2024-04-10 DIAGNOSIS — I9589 Other hypotension: Secondary | ICD-10-CM

## 2024-04-10 DIAGNOSIS — M48061 Spinal stenosis, lumbar region without neurogenic claudication: Secondary | ICD-10-CM

## 2024-04-10 DIAGNOSIS — Z992 Dependence on renal dialysis: Secondary | ICD-10-CM

## 2024-04-10 DIAGNOSIS — I4819 Other persistent atrial fibrillation: Secondary | ICD-10-CM

## 2024-04-10 DIAGNOSIS — S32402S Unspecified fracture of left acetabulum, sequela: Secondary | ICD-10-CM

## 2024-04-10 DIAGNOSIS — S32592S Other specified fracture of left pubis, sequela: Secondary | ICD-10-CM | POA: Diagnosis not present

## 2024-04-10 DIAGNOSIS — N186 End stage renal disease: Secondary | ICD-10-CM

## 2024-04-10 DIAGNOSIS — S32592A Other specified fracture of left pubis, initial encounter for closed fracture: Secondary | ICD-10-CM | POA: Diagnosis not present

## 2024-04-10 DIAGNOSIS — D696 Thrombocytopenia, unspecified: Secondary | ICD-10-CM

## 2024-04-10 DIAGNOSIS — E871 Hypo-osmolality and hyponatremia: Secondary | ICD-10-CM | POA: Insufficient documentation

## 2024-04-10 LAB — CBC
HCT: 31.4 % — ABNORMAL LOW (ref 39.0–52.0)
Hemoglobin: 10.2 g/dL — ABNORMAL LOW (ref 13.0–17.0)
MCH: 30.5 pg (ref 26.0–34.0)
MCHC: 32.5 g/dL (ref 30.0–36.0)
MCV: 94 fL (ref 80.0–100.0)
Platelets: 77 K/uL — ABNORMAL LOW (ref 150–400)
RBC: 3.34 MIL/uL — ABNORMAL LOW (ref 4.22–5.81)
RDW: 17.4 % — ABNORMAL HIGH (ref 11.5–15.5)
WBC: 5.4 K/uL (ref 4.0–10.5)
nRBC: 0 % (ref 0.0–0.2)

## 2024-04-10 LAB — BASIC METABOLIC PANEL WITH GFR
Anion gap: 14 (ref 5–15)
BUN: 40 mg/dL — ABNORMAL HIGH (ref 8–23)
CO2: 27 mmol/L (ref 22–32)
Calcium: 8.5 mg/dL — ABNORMAL LOW (ref 8.9–10.3)
Chloride: 92 mmol/L — ABNORMAL LOW (ref 98–111)
Creatinine, Ser: 5.45 mg/dL — ABNORMAL HIGH (ref 0.61–1.24)
GFR, Estimated: 10 mL/min — ABNORMAL LOW (ref >60)
Glucose, Bld: 87 mg/dL (ref 70–99)
Potassium: 5 mmol/L (ref 3.5–5.1)
Sodium: 133 mmol/L — ABNORMAL LOW (ref 135–145)

## 2024-04-10 LAB — PHOSPHORUS: Phosphorus: 6.2 mg/dL — ABNORMAL HIGH (ref 2.5–4.6)

## 2024-04-10 MED ORDER — BISACODYL 5 MG PO TBEC
5.0000 mg | DELAYED_RELEASE_TABLET | Freq: Once | ORAL | Status: AC
  Administered 2024-04-10: 5 mg via ORAL
  Filled 2024-04-10: qty 1

## 2024-04-10 MED ORDER — POLYETHYLENE GLYCOL 3350 17 G PO PACK
17.0000 g | PACK | Freq: Two times a day (BID) | ORAL | Status: DC
  Administered 2024-04-10 – 2024-04-12 (×5): 17 g via ORAL
  Filled 2024-04-10 (×5): qty 1

## 2024-04-10 MED ORDER — LACTULOSE 10 GM/15ML PO SOLN
30.0000 g | Freq: Once | ORAL | Status: AC
  Administered 2024-04-10: 30 g via ORAL
  Filled 2024-04-10: qty 60

## 2024-04-10 NOTE — Assessment & Plan Note (Signed)
 Sodium a couple points less than the normal range.

## 2024-04-10 NOTE — Progress Notes (Signed)
 [facility]  ROUNDING NOTE   Subjective:   Ronald [name] is a 78 year old male with history of hypertension, coronary artery disease, atrial fibrillation, congestive heart failure, history of nonsustained V. tach and end-stage renal disease on hemodialysis now comes to the emergency room with a fall. Patient has been admitted under observation for PAF (paroxysmal atrial fibrillation) (HCC) [I48.0] ESRD on hemodialysis (HCC) [N18.6, Z99.2] Closed nondisplaced fracture of left acetabulum, unspecified portion of acetabulum, initial encounter (HCC) [S32.402A] Closed fracture of multiple pubic rami, initial encounter (HCC) [S32.599A] Closed fracture of multiple pubic rami, left, sequela [S32.592S]  Patient is known to our practice and receives outpatient dialysis treatments at [facility].   Update: Patient seen sitting up in bed States he can move his left leg with little discoamfort  Objective:  Vital signs in last 24 hours:  Temp:  [97.6 F (36.4 C)-99 F (37.2 C)] 98.1 F (36.7 C) (11/11 0816) Pulse Rate:  [75-98] 82 (11/11 0816) Resp:  [13-18] 17 (11/11 0816) BP: (98-123)/(47-58) 111/50 (11/11 0816) SpO2:  [96 %-99 %] 98 % (11/11 0816) Weight:  [87.4 kg-88.7 kg] 87.4 kg (11/10 1143)  Weight change:  Filed Weights   04/09/24 0748 04/09/24 1126 04/09/24 1143  Weight: 90.6 kg 88.7 kg 87.4 kg    Intake/Output: I/O last 3 completed shifts: In: 780 [P.O.:780] Out: 1500 [Other:1500]   Intake/Output this shift:  Total I/O In: 360 [P.O.:360] Out: -   Physical Exam: General: NAD  Head: Normocephalic, atraumatic. Moist oral mucosal membranes  Eyes: Anicteric  Lungs:  Clear,  [place] O2  Heart: Regular rate and rhythm  Abdomen:  Soft, nontender  Extremities:  1+ peripheral edema.  Neurologic: Awake, alert, conversant  Skin: Warm,dry, no rash  Access: Rt internal jugular permcath    Basic Metabolic Panel: Recent Labs  Lab 04/07/24 2002 04/10/24 0517  NA  138 133*  K 4.0 5.0  CL 96* 92*  CO2 26 27  GLUCOSE 191* 87  BUN 42* 40*  CREATININE 5.80* 5.45*  CALCIUM  9.0 8.5*    Liver Function Tests: No results for input(s): AST, ALT, ALKPHOS, BILITOT, PROT, ALBUMIN in the last 168 hours. No results for input(s): LIPASE, AMYLASE in the last 168 hours. No results for input(s): AMMONIA in the last 168 hours.  CBC: Recent Labs  Lab 04/07/24 2002 04/10/24 0517  WBC 5.9 5.4  NEUTROABS 4.4  --   HGB 11.3* 10.2*  HCT 34.4* 31.4*  MCV 94.5 94.0  PLT 99* 77*    Cardiac Enzymes: No results for input(s): CKTOTAL, CKMB, CKMBINDEX, TROPONINI in the last 168 hours.  BNP: Invalid input(s): POCBNP  CBG: No results for input(s): GLUCAP in the last 168 hours.  Microbiology: Results for orders placed or performed during the hospital encounter of 11/06/23  MRSA Next Gen by PCR, Nasal     Status: None   Collection Time: 11/07/23 10:47 AM   Specimen: Nasal Mucosa; Nasal Swab  Result Value Ref Range Status   MRSA by PCR Next Gen NOT DETECTED NOT DETECTED Final    Comment: (NOTE) The GeneXpert MRSA Assay (FDA approved for NASAL specimens only), is one component of a comprehensive MRSA colonization surveillance program. It is not intended to diagnose MRSA infection nor to guide or monitor treatment for MRSA infections. Test performance is not FDA approved in patients less than 64 years old. Performed at [facility], [address], [place], [place] [address]     Coagulation Studies: No results for input(s):  LABPROT, INR in the last 72 hours.  Urinalysis: No results for input(s): COLORURINE, LABSPEC, PHURINE, GLUCOSEU, HGBUR, BILIRUBINUR, KETONESUR, PROTEINUR, UROBILINOGEN, NITRITE, LEUKOCYTESUR in the last 72 hours.  Invalid input(s): APPERANCEUR    Imaging: No results found.    Medications:     amiodarone   400 mg Oral Daily   aspirin  EC  81 mg Oral Daily    atorvastatin   40 mg Oral Daily   Chlorhexidine  Gluconate Cloth  6 each Topical Q0600   doxepin   10 mg Oral QHS   gabapentin   100 mg Oral QHS   heparin  injection (subcutaneous)  5,000 Units Subcutaneous Q8H   levothyroxine   75 mcg Oral Q0600   midodrine   10 mg Oral TID WC   pantoprazole   40 mg Oral BID   polyethylene glycol  17 g Oral BID   sodium chloride  flush  10-40 mL Intracatheter Q12H   acetaminophen  **OR** acetaminophen , albuterol , HYDROcodone-acetaminophen , nitroGLYCERIN , ondansetron  **OR** ondansetron  (ZOFRAN ) IV, sodium chloride  flush  Assessment/ Plan:  [name] is a 78 y.o.  male with history of hypertension, coronary artery disease, atrial fibrillation, congestive heart failure, history of nonsustained V. tach and end-stage renal disease on hemodialysis now comes to the emergency room with a fall. Patient has been admitted under observation for PAF (paroxysmal atrial fibrillation) (HCC) [I48.0] ESRD on hemodialysis (HCC) [N18.6, Z99.2] Closed nondisplaced fracture of left acetabulum, unspecified portion of acetabulum, initial encounter (HCC) [S32.402A] Closed fracture of multiple pubic rami, initial encounter (HCC) [S32.599A] Closed fracture of multiple pubic rami, left, sequela [S32.592S]  [facility] MWF/Rt Permcath  End stage renal disease on hemodialysis. Received dialysis yesterday, UF 1.5L achieved. Next treatment scheduled for Wednesday. Patient will need to tolerating sitting in a chair for dialysis prior to discharge.   Fracture, closed. Pubic rami fracture with left acetabular fracture. Seen on CT. Orthopedics consulted, no surgical intervention. Continue pain management and PT  3. Anemia of chronic kidney disease Lab Results  Component Value Date   HGB 10.2 (L) 04/10/2024    Will continue to monitor hgb during this admission.   4. Secondary Hyperparathyroidism: with outpatient labs: None available  Lab Results  Component Value Date   CALCIUM   8.5 (L) 04/10/2024   PHOS 7.2 (H) 08/08/2023  Calcium  acceptable, awaiting updated phos.  Will continue to monitor bone minerals.    LOS: 1 Danilyn Cocke 11/11/202511:05 AM

## 2024-04-10 NOTE — Progress Notes (Signed)
 Occupational Therapy Treatment Patient Details Name: Ronald [name] [facility] MRN: [id number] DOB: Jul 20, 1945 Today's Date: 04/10/2024   History of present illness Pt is a 78 year old male currently being followed by palliative care, being admitted with a left acetabular and pubic rami fracture, resulting from a presumed accidental fall in a parking lot when his legs just  gave out.  He is to be TTWB with use of RW. PMH of coronary artery disease status post CABG x 2, ischemic cardiomyopathy with left ventricular ejection fraction of 20 to 25%, persistent a-fib, DM2, ESRD on hemodialysis, GERD, COPD.   OT comments  Patient seen for OT treatment on this date. Upon arrival to room patient semi fowlers in bed, reporting pain 6/10, noted he only took tylenol  earlier; notified he has stronger pain meds ordered, discussed with patient and nurse about taking more pain medicine so patient can participate more effectively in PT/OT, patient agreeable. Patient reluctant but agreeable to treatment. Patient requires x 2 for mobility due to anxiety/pain related anxiety. Patient transitioned to EOB with max A x 2 (A to lift LLE and for trunk). While sitting EOB, patient was able to scoot EOB without A. Reviewed TTWB prior to standing, patient with good return understanding. Patient performed x 2 sit<>stand with mod A x 2; tremulous and required cued for pursed lip breathing/relaxation while in stance with good improvement; poor implementation of TTWB in LLE initially but good return demo with cues. Patient transitioned into supine with mod A for BLE back into bed; patient able to perform bed mobility without A; patient ended treatment in bed with bed/chair alarm on and all needs within reach. Patient making good progress toward goals, will continue to follow POC. Discharge recommendation remains appropriate.        If plan is discharge home, recommend the following:  A lot of help with bathing/dressing/bathroom;Two people to  help with walking and/or transfers;Help with stairs or ramp for entrance   Equipment Recommendations  Other (comment) (defer to next venue of care)    Recommendations for Other Services      Precautions / Restrictions Precautions Precautions: Fall Recall of Precautions/Restrictions: Impaired Restrictions Weight Bearing Restrictions Per Provider Order: Yes LLE Weight Bearing Per Provider Order: Touchdown weight bearing       Mobility Bed Mobility Overal bed mobility: Needs Assistance Bed Mobility: Supine to Sit, Sit to Supine     Supine to sit: Max assist, +2 for physical assistance, Used rails, HOB elevated Sit to supine: Max assist, Used rails, HOB elevated   General bed mobility comments: patient was able to scoot EOB, pull self up in bed using rails, roll side to side without A; required x 2 only for supine to sitting EOB    Transfers Overall transfer level: Needs assistance Equipment used: Rolling walker (2 wheels) Transfers: Sit to/from Stand Sit to Stand: Mod assist, +2 physical assistance, From elevated surface           General transfer comment: bed height elevated and increased assist x2 to stand from EOB to RW with cues for hand placement and maintaining TTWB on LLE; heavy reliance on RW to stand for limited time with tremors noted in BUEs; stood x 2 trials; on 2nd trial able to maintain TTWB/NWB with min cues     Balance Overall balance assessment: Needs assistance         Standing balance support: Bilateral upper extremity supported, Reliant on assistive device for balance Standing balance-Leahy Scale: Poor  ADL either performed or assessed with clinical judgement   ADL                                              Extremity/Trunk Assessment              Vision       Perception     Praxis     Communication Communication Communication: Impaired Factors Affecting Communication:  Hearing impaired   Cognition Arousal: Alert Behavior During Therapy: WFL for tasks assessed/performed Cognition: No apparent impairments                               Following commands: Intact        Cueing   Cueing Techniques: Verbal cues, Gestural cues, Tactile cues, Visual cues  Exercises      Shoulder Instructions       General Comments      Pertinent Vitals/ Pain       Pain Assessment Pain Assessment: 0-10 Pain Score: 6  Pain Location: L hip/LLE Pain Descriptors / Indicators: Sore, Aching, Guarding, Discomfort Pain Intervention(s): Monitored during session  Home Living                                          Prior Functioning/Environment              Frequency  Min 2X/week        Progress Toward Goals  OT Goals(current goals can now be found in the care plan section)  Progress towards OT goals: Progressing toward goals  Acute Rehab OT Goals Patient Stated Goal: to go home OT Goal Formulation: With patient/family Time For Goal Achievement: 04/23/24 Potential to Achieve Goals: Fair ADL Goals Pt Will Perform Lower Body Bathing: with contact guard assist;with min assist;sit to/from stand;sitting/lateral leans Pt Will Perform Lower Body Dressing: with min assist;sitting/lateral leans;sit to/from stand Pt Will Transfer to Toilet: with min assist;stand pivot transfer;ambulating  Plan      Co-evaluation                 AM-PAC OT 6 Clicks Daily Activity     Outcome Measure   Help from another person eating meals?: A Little Help from another person taking care of personal grooming?: A Little Help from another person toileting, which includes using toliet, bedpan, or urinal?: A Lot Help from another person bathing (including washing, rinsing, drying)?: A Lot Help from another person to put on and taking off regular upper body clothing?: A Little Help from another person to put on and taking off regular lower  body clothing?: A Lot 6 Click Score: 15    End of Session Equipment Utilized During Treatment: Gait belt;Rolling walker (2 wheels);Oxygen  OT Visit Diagnosis: Other abnormalities of gait and mobility (R26.89);Muscle weakness (generalized) (M62.81)   Activity Tolerance Patient tolerated treatment well;Patient limited by fatigue;Patient limited by pain   Patient Left     Nurse Communication Mobility status        Time: 8547-8486 OT Time Calculation (min): 21 min  Charges: OT General Charges $OT Visit: 1 Visit OT Treatments $Self Care/Home Management : 8-22 mins  Rogers Clause, OT/L MSOT, 04/10/2024

## 2024-04-10 NOTE — Plan of Care (Signed)

## 2024-04-10 NOTE — NC FL2 (Signed)
 Ronald [name]  MEDICAID FL2 LEVEL OF CARE FORM     IDENTIFICATION  Patient Name: Ronald [name] [facility] Birthdate: 10/29/1945 Sex: male Admission Date (Current Location): 04/07/2024  [place] and [place] Number:  Chiropodist and Address:  [facility], [address] [place], [place] [address]      Provider Number: [id number]  Attending Physician Name and Address:  Josette Ade, MD  Relative Name and Phone Number:       Current Level of Care: Hospital Recommended Level of Care: Skilled Nursing Facility Prior Approval Number:    Date Approved/Denied:   PASRR Number: [id number]  Discharge Plan: SNF    Current Diagnoses: Patient Active Problem List   Diagnosis Date Noted   Thrombocytopenia 04/08/2024   Overweight 04/08/2024   Closed fracture of multiple pubic rami, left, sequela 04/07/2024   Closed left acetabular fracture (HCC) 04/07/2024   Ground level fall, undetermined but presumed accidental 04/07/2024   Chronic HFrEF (heart failure with reduced ejection fraction) (HCC) 04/07/2024   Ischemic cardiomyopathy (EF 20 to 25%), not an AICD candidate 04/07/2024   Oropharyngeal dysphagia 03/20/2024   Exudative age-related macular degeneration, left eye, with active choroidal neovascularization (HCC) 03/20/2024   History of ventricular tachycardia 02/27/2024   Abnormal EKG 11/08/2023   Tachyarrhythmia 11/06/2023   Dyslipidemia 09/30/2023   Non-sustained ventricular tachycardia (HCC) 09/29/2023   Hypothyroidism 09/29/2023   Tremor of both hands 09/05/2023   Pruritus due to systemic disorder 09/05/2023   Chronic hypotension 08/07/2023   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   HFrEF (heart failure with reduced ejection fraction) (HCC) 07/19/2023   Muscle tension headache 11/15/2022   Acquired thrombophilia 11/10/2022   ESRD on hemodialysis (HCC) 09/16/2022   Physical deconditioning 09/16/2022   Bilateral leg edema 09/13/2022   OSA  (obstructive sleep apnea) 07/22/2022   Typical atrial flutter (HCC) 07/15/2022   Acquired hypothyroidism 05/28/2022   GERD without esophagitis 05/28/2022   Essential hypertension 05/05/2022   Prediabetes 04/06/2022   Spinal stenosis of lumbar region without neurogenic claudication 04/06/2022   Weakness of both lower extremities 04/06/2022   Upper airway cough syndrome 10/01/2021   Hyperkalemia 01/16/2020   Bursitis of hip 12/18/2018   Paroxysmal atrial fibrillation (HCC) 06/10/2018   Persistent atrial fibrillation (HCC) 06/10/2018   Atherosclerosis of native arteries of extremity with intermittent claudication 06/25/2016   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   CAD S/P CABG (1998, 2007), subsequent stents    Allergic rhinitis 08/19/2015   Anemia 10/04/2014   AA (aortic aneurysm) 10/04/2014   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   Basal cell papilloma 10/04/2014   Unspecified osteoarthritis, unspecified site 10/04/2014   Thoracic aortic aneurysm (TAA) 10/04/2014   Atherosclerotic heart disease of native coronary artery without angina pectoris 10/04/2014   Benign prostatic hyperplasia with lower urinary tract symptoms 10/24/2012   DOE (dyspnea on exertion) 11/12/2009   Renal artery stenosis     Mixed hyperlipidemia 09/04/2008   History of redo bypass grafting 09/04/2008    Orientation RESPIRATION BLADDER Height & Weight     Self, Time, Situation, Place  Normal Continent Weight: 192 lb 10.9 oz (87.4 kg) Height:  5' 9 (175.3 cm)  BEHAVIORAL SYMPTOMS/MOOD NEUROLOGICAL BOWEL NUTRITION STATUS      Continent Diet (Heart)  AMBULATORY STATUS COMMUNICATION OF NEEDS Skin   Limited Assist Verbally Skin abrasions (Left arm upper posterior)                       Personal Care Assistance Level of  Assistance  Bathing, Feeding, Dressing Bathing Assistance: Limited assistance Feeding assistance: Independent Dressing Assistance: Limited assistance     Functional Limitations Info   Sight, Hearing, Speech Sight Info: Impaired Hearing Info: Adequate Speech Info: Adequate    SPECIAL CARE FACTORS FREQUENCY  PT (By licensed PT), OT (By licensed OT)     PT Frequency: 5x/week OT Frequency: 5x/week            Contractures      Additional Factors Info  Code Status, Allergies Code Status Info: DNR limited Allergies Info: Predicort (Prednisolone), Ciprofloxacin, Hydrochlorothiazide, Hydrocodone, Hydrocodone-acetaminophen , Sulfa Antibiotics, Penicillins           Current Medications (04/10/2024):  This is the current hospital active medication list Current Facility-Administered Medications  Medication Dose Route Frequency Provider Last Rate Last Admin   acetaminophen  (TYLENOL ) tablet 650 mg  650 mg Oral Q6H PRN Duncan, Hazel V, MD   650 mg at 04/09/24 1403   Or   acetaminophen  (TYLENOL ) suppository 650 mg  650 mg Rectal Q6H PRN Duncan, Hazel V, MD       albuterol  (PROVENTIL ) (2.5 MG/3ML) 0.083% nebulizer solution 2.5 mg  2.5 mg Nebulization Q2H PRN Duncan, Hazel V, MD       amiodarone  (PACERONE ) tablet 400 mg  400 mg Oral Daily Duncan, Hazel V, MD   400 mg at 04/10/24 0910   aspirin  EC tablet 81 mg  81 mg Oral Daily Duncan, Hazel V, MD   81 mg at 04/10/24 0910   atorvastatin  (LIPITOR) tablet 40 mg  40 mg Oral Daily Duncan, Hazel V, MD   40 mg at 04/10/24 0910   Chlorhexidine  Gluconate Cloth 2 % PADS 6 each  6 each Topical Q0600 Druscilla Bald, NP   6 each at 04/10/24 0541   doxepin  (SINEQUAN ) capsule 10 mg  10 mg Oral QHS Duncan, Hazel V, MD   10 mg at 04/09/24 2119   gabapentin  (NEURONTIN ) capsule 100 mg  100 mg Oral QHS Duncan, Hazel V, MD   100 mg at 04/09/24 2119   heparin  injection 5,000 Units  5,000 Units Subcutaneous Q8H Duncan, Hazel V, MD   5,000 Units at 04/10/24 0541   HYDROcodone-acetaminophen  (NORCO/VICODIN) 5-325 MG per tablet 1 tablet  1 tablet Oral Q4H PRN Josette Ade, MD       levothyroxine  (SYNTHROID ) tablet 75 mcg  75 mcg Oral Q0600  Cleatus Delayne GAILS, MD   75 mcg at 04/10/24 0541   midodrine  (PROAMATINE ) tablet 10 mg  10 mg Oral TID WC Josette Ade, MD   10 mg at 04/10/24 0910   nitroGLYCERIN  (NITROSTAT ) SL tablet 0.4 mg  0.4 mg Sublingual Q5 min PRN Cleatus Delayne GAILS, MD       ondansetron  (ZOFRAN ) tablet 4 mg  4 mg Oral Q6H PRN Duncan, Hazel V, MD       Or   ondansetron  (ZOFRAN ) injection 4 mg  4 mg Intravenous Q6H PRN Duncan, Hazel V, MD   4 mg at 04/09/24 1542   pantoprazole  (PROTONIX ) EC tablet 40 mg  40 mg Oral BID Duncan, Hazel V, MD   40 mg at 04/10/24 0910   polyethylene glycol (MIRALAX  / GLYCOLAX ) packet 17 g  17 g Oral BID Josette Ade, MD   17 g at 04/10/24 0909   sodium chloride  flush (NS) 0.9 % injection 10-40 mL  10-40 mL Intracatheter Q12H Cleatus Delayne GAILS, MD   10 mL at 04/10/24 0915   sodium chloride  flush (NS) 0.9 % injection 10-40  mL  10-40 mL Intracatheter PRN Cleatus Delayne GAILS, MD         Discharge Medications: Please see discharge summary for a list of discharge medications.  Relevant Imaging Results:  Relevant Lab Results:   Additional Information SSN: [id number]  Ronald Louder, LCSW

## 2024-04-10 NOTE — TOC Initial Note (Signed)
 Transition of Care [facility]) - Initial/Assessment Note    Patient Details  Name: Ronald [name] MRN: [id number] Date of Birth: [birth year]  Transition of Care [facility]) CM/SW Contact:    Alvaro Louder, LCSW Phone Number: 04/10/2024, 1:16 PM  Clinical Narrative:       Per chart review patient is from home. PCP is Leita Adie  LCSWA faxed out patient to SNF's in [place]. TOC to present facilities to patient at the bedside.           TOC to follow for discharge        Patient Goals and CMS Choice            Expected Discharge Plan and Services                                              Prior Living Arrangements/Services                       Activities of Daily Living   ADL Screening (condition at time of admission) Independently performs ADLs?: No Does the patient have a NEW difficulty with bathing/dressing/toileting/self-feeding that is expected to last >3 days?: Yes (Initiates electronic notice to provider for possible OT consult) Does the patient have a NEW difficulty with getting in/out of bed, walking, or climbing stairs that is expected to last >3 days?: Yes (Initiates electronic notice to provider for possible PT consult) Does the patient have a NEW difficulty with communication that is expected to last >3 days?: No Is the patient deaf or have difficulty hearing?: Yes Does the patient have difficulty seeing, even when wearing glasses/contacts?: No Does the patient have difficulty concentrating, remembering, or making decisions?: No  Permission Sought/Granted                  Emotional Assessment              Admission diagnosis:  PAF (paroxysmal atrial fibrillation) (HCC) [I48.0] ESRD on hemodialysis (HCC) [N18.6, Z99.2] Closed nondisplaced fracture of left acetabulum, unspecified portion of acetabulum, initial encounter (HCC) [S32.402A] Closed fracture of multiple pubic rami, initial encounter (HCC) [S32.599A] Closed  fracture of multiple pubic rami, left, sequela [S32.592S] Patient Active Problem List   Diagnosis Date Noted   Thrombocytopenia 04/08/2024   Overweight 04/08/2024   Closed fracture of multiple pubic rami, left, sequela 04/07/2024   Closed left acetabular fracture (HCC) 04/07/2024   Ground level fall, undetermined but presumed accidental 04/07/2024   Chronic HFrEF (heart failure with reduced ejection fraction) (HCC) 04/07/2024   Ischemic cardiomyopathy (EF 20 to 25%), not an AICD candidate 04/07/2024   Oropharyngeal dysphagia 03/20/2024   Exudative age-related macular degeneration, left eye, with active choroidal neovascularization (HCC) 03/20/2024   History of ventricular tachycardia 02/27/2024   Abnormal EKG 11/08/2023   Tachyarrhythmia 11/06/2023   Dyslipidemia 09/30/2023   Non-sustained ventricular tachycardia (HCC) 09/29/2023   Hypothyroidism 09/29/2023   Tremor of both hands 09/05/2023   Pruritus due to systemic disorder 09/05/2023   Chronic hypotension 08/07/2023   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   HFrEF (heart failure with reduced ejection fraction) (HCC) 07/19/2023   Muscle tension headache 11/15/2022   Acquired thrombophilia 11/10/2022   ESRD on hemodialysis (HCC) 09/16/2022   Physical deconditioning 09/16/2022   Bilateral leg edema 09/13/2022   OSA (obstructive sleep apnea) 07/22/2022   Typical atrial  flutter (HCC) 07/15/2022   Acquired hypothyroidism 05/28/2022   GERD without esophagitis 05/28/2022   Essential hypertension 05/05/2022   Prediabetes 04/06/2022   Spinal stenosis of lumbar region without neurogenic claudication 04/06/2022   Weakness of both lower extremities 04/06/2022   Upper airway cough syndrome 10/01/2021   Hyperkalemia 01/16/2020   Bursitis of hip 12/18/2018   Paroxysmal atrial fibrillation (HCC) 06/10/2018   Persistent atrial fibrillation (HCC) 06/10/2018   Atherosclerosis of native arteries of extremity with intermittent  claudication 06/25/2016   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   CAD S/P CABG (1998, 2007), subsequent stents    Allergic rhinitis 08/19/2015   Anemia 10/04/2014   AA (aortic aneurysm) 10/04/2014   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   Basal cell papilloma 10/04/2014   Unspecified osteoarthritis, unspecified site 10/04/2014   Thoracic aortic aneurysm (TAA) 10/04/2014   Atherosclerotic heart disease of native coronary artery without angina pectoris 10/04/2014   Benign prostatic hyperplasia with lower urinary tract symptoms 10/24/2012   DOE (dyspnea on exertion) 11/12/2009   Renal artery stenosis     Mixed hyperlipidemia 09/04/2008   History of redo bypass grafting 09/04/2008   PCP:  [name], MD Pharmacy:   [facility] [address], [place] - [address] [place] [address] Phone: [phone] Fax: [phone]  [facility] - [place], [place] - [address] [place] [address] Phone: [phone] Fax: [phone]     Social Drivers of Health (SDOH) Social History: SDOH Screenings   Food Insecurity: Patient Declined (04/08/2024)  Housing: Unknown (04/08/2024)  Transportation Needs: Patient Declined (04/08/2024)  Utilities: Patient Declined (04/08/2024)  Alcohol Screen: Low Risk  (06/22/2023)  Depression (PHQ2-9): Medium Risk (03/30/2024)  Financial Resource Strain: Low Risk  (06/23/2023)  Physical Activity: Inactive (06/23/2023)  Social Connections: Unknown (04/08/2024)  Stress: No Stress Concern Present (06/23/2023)  Tobacco Use: Medium Risk (03/30/2024)  Health Literacy: Adequate Health Literacy (06/23/2023)   SDOH Interventions:     Readmission Risk Interventions    11/07/2023   10:12 AM  Readmission Risk Prevention Plan  Transportation Screening Complete  Medication Review (RN Care Manager) Complete  PCP or Specialist appointment within 3-5 days of discharge Complete  SW Recovery  Care/Counseling Consult Complete  Palliative Care Screening Not Applicable  Skilled Nursing Facility Not Applicable

## 2024-04-10 NOTE — Progress Notes (Signed)
 Progress Note   Patient: Ronald [name] [facility] FMW:[id number] DOB: 12/10/1945 DOA: 04/07/2024     1 DOS: the patient was seen and examined on 04/10/2024   Brief hospital course: 78 y.o. 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 tach on amiodarone , not on AC, currently being followed by palliative care, being admitted with a left acetabular and pubic rami fracture, resulting from a presumed accidental fall in a parking lot when his legs just  gave out.  Denies hitting his head or passing out.  Denied preceding chest pain, palpitations or shortness of breath or lightheadedness, visual disturbance or headache. In the ED, vitals unremarkable CBC notable forBaseline anemia of 11.3 and baseline thrombocytopenia of 99 BMP in keeping with dialysis status but otherwise unremarkable EKG showed A-fib at 101 with nonspecific T wave changes   Trauma imaging including CT head C-spine which were nonacute CT hip showed a left acetabular fracture as well as left superior and inferior pubic rami fractures   The ED provider spoke with orthopedics who said non operative  for now, recommended toe-touch weightbearing and rolling walker at all times   Got a couple of doses of morphine  in the ED   Admission requested for pain control and to arrange for SNF/rehab    11/9.  Patient unable to lift his left leg up off the bed.  Will see how he does with physical therapy.  Seen by orthopedic surgery and gave the okay for toe-touch left foot.  Pain control. 11/10.  Patient seen down on dialysis was feeling okay except for pain in his left groin/hip area. 11/11.  Physical therapy recommending rehab.  Nephrology notified me that he will have to be able to sit up in order to receive outpatient dialysis.  Patient still  having pain in his left groin but today able to bend his left knee but I am able to straight leg raise.  Assessment and Plan: * Closed fracture of multiple pubic rami, left, sequela Closed left acetabular fracture Nonoperative management, toe-touch left side approved by orthopedic.  Patient was lethargic and had low blood pressure so PT and OT did not see on 11/9.  Physical therapy recommending rehab.  Transitional care team to look into bed offers and insurance authorization.  Pain control and switch over to Tylenol  as quickly as possible.   Ground level fall, undetermined but presumed accidental PT and OT evaluations appreciated  Chronic hypotension Continue midodrine  at increased dose 10 mg 3 times daily.  Chronic HFrEF (heart failure with reduced ejection fraction) (HCC) Ischemic cardiomyopathy, EF 20 to 25%, not an AICD candidate History of V. Tach GDMT intolerant due to chronic hypotension Clinically euvolemic   Persistent atrial fibrillation (HCC) Not currently on anticoagulation Continue amiodarone  and aspirin   CAD S/P CABG (1998, 2007), subsequent stents No acute concerns, no complaints of chest pain and EKG is nonacute Continue aspirin  and atorvastatin   ESRD on hemodialysis [facility]) Dialysis done yesterday.  As per nephrology will need to sit up in chair to do outpatient dialysis.  Hyponatremia Sodium a couple points less than the normal range.  Overweight BMI 28.45 with current height and weight in computer.  Thrombocytopenia Chronic in nature  Thoracic aortic aneurysm (TAA) No acute issues suspected at this time.  Spinal stenosis of lumbar region without neurogenic claudication No  acute issues, pain control        Subjective: Patient feels okay.  Today able to lift up his left knee up off the bed.  Unable to straight leg raise.  Patient admitted after a fall found to have left pubic rami fractures and acetabular fracture.  Physical Exam: Vitals:    04/09/24 1627 04/09/24 1946 04/10/24 0439 04/10/24 0816  BP: (!) 100/49 (!) 98/50 (!) 110/47 (!) 111/50  Pulse: 84 85 75 82  Resp: 16 16 18 17   Temp: 99 F (37.2 C) 98.5 F (36.9 C) 97.8 F (36.6 C) 98.1 F (36.7 C)  TempSrc:  Oral    SpO2: 99% 96% 98% 98%  Weight:      Height:       Physical Exam HENT:     Head: Normocephalic.     Mouth/Throat:     Pharynx: No oropharyngeal exudate.  Eyes:     General: Lids are normal.     Conjunctiva/sclera: Conjunctivae normal.  Cardiovascular:     Rate and Rhythm: Normal rate. Rhythm irregularly irregular.     Heart sounds: S1 normal and S2 normal. Murmur heard.     Systolic murmur is present with a grade of 2/6.  Pulmonary:     Breath sounds: No decreased breath sounds, wheezing, rhonchi or rales.  Abdominal:     Palpations: Abdomen is soft.     Tenderness: There is no abdominal tenderness.  Musculoskeletal:     Right lower leg: Swelling present.     Left lower leg: Swelling present.  Skin:    General: Skin is warm.     Findings: No rash.  Neurological:     Mental Status: He is alert.     Comments: Today able to bend at the left knee.  Unable to straight leg raise.  Able to flex and extend at the left ankle.  Psychiatric:        Behavior: Behavior normal.     Data Reviewed: Hemoglobin 10.2, platelet count 77, white blood count 5.4, creatinine 5.45, sodium 133  Family Communication: Updated wife on the phone  Disposition: Status is: Inpatient Remains inpatient appropriate because: Will need a rehab bed and insurance authorization.  As per nephrology will also have to sit up in chair for dialysis.  Planned Discharge Destination: Rehab    Time spent: 28 minutes  Author: Charlie Patterson, MD 04/10/2024 1:48 PM  For on call review [email or web address].

## 2024-04-11 LAB — RENAL FUNCTION PANEL
Albumin: 3.4 g/dL — ABNORMAL LOW (ref 3.5–5.0)
Anion gap: 14 (ref 5–15)
BUN: 54 mg/dL — ABNORMAL HIGH (ref 8–23)
CO2: 27 mmol/L (ref 22–32)
Calcium: 8.7 mg/dL — ABNORMAL LOW (ref 8.9–10.3)
Chloride: 91 mmol/L — ABNORMAL LOW (ref 98–111)
Creatinine, Ser: 7.11 mg/dL — ABNORMAL HIGH (ref 0.61–1.24)
GFR, Estimated: 7 mL/min — ABNORMAL LOW (ref >60)
Glucose, Bld: 98 mg/dL (ref 70–99)
Phosphorus: 7.5 mg/dL — ABNORMAL HIGH (ref 2.5–4.6)
Potassium: 5.7 mmol/L — ABNORMAL HIGH (ref 3.5–5.1)
Sodium: 132 mmol/L — ABNORMAL LOW (ref 135–145)

## 2024-04-11 LAB — CBC
HCT: 31.5 % — ABNORMAL LOW (ref 39.0–52.0)
Hemoglobin: 10.3 g/dL — ABNORMAL LOW (ref 13.0–17.0)
MCH: 31 pg (ref 26.0–34.0)
MCHC: 32.7 g/dL (ref 30.0–36.0)
MCV: 94.9 fL (ref 80.0–100.0)
Platelets: 90 K/uL — ABNORMAL LOW (ref 150–400)
RBC: 3.32 MIL/uL — ABNORMAL LOW (ref 4.22–5.81)
RDW: 17.2 % — ABNORMAL HIGH (ref 11.5–15.5)
WBC: 5.8 K/uL (ref 4.0–10.5)
nRBC: 0 % (ref 0.0–0.2)

## 2024-04-11 MED ORDER — SEVELAMER CARBONATE 800 MG PO TABS
800.0000 mg | ORAL_TABLET | Freq: Three times a day (TID) | ORAL | Status: DC
  Administered 2024-04-11 – 2024-04-12 (×5): 800 mg via ORAL
  Filled 2024-04-11 (×5): qty 1

## 2024-04-11 MED ORDER — BISACODYL 10 MG RE SUPP
10.0000 mg | Freq: Every day | RECTAL | Status: DC | PRN
  Administered 2024-04-11: 10 mg via RECTAL
  Filled 2024-04-11: qty 1

## 2024-04-11 MED ORDER — MUPIROCIN 2 % EX OINT
TOPICAL_OINTMENT | Freq: Every day | CUTANEOUS | Status: DC
  Administered 2024-04-11: 1 via TOPICAL
  Filled 2024-04-11: qty 22

## 2024-04-11 MED ORDER — HEPARIN SODIUM (PORCINE) 1000 UNIT/ML IJ SOLN
INTRAMUSCULAR | Status: AC
  Filled 2024-04-11: qty 4

## 2024-04-11 NOTE — Progress Notes (Deleted)
 Patient at present is not active with Palliative care

## 2024-04-11 NOTE — TOC Progression Note (Addendum)
 Transition of Care [facility]) - Progression Note    Patient Details  Name: Ronald [name] MRN: [id number] Date of Birth: 1946-05-30  Transition of Care [facility]) CM/SW Contact  Victory Jackquline RAMAN, RN Phone Number: 04/11/2024, 9:36 AM  Clinical Narrative:   RNCM received a secure chat message from the MD stating This pt cannot tolerate sitting up to HD due to a left acetabular fracture as well as left superior and inferior pubic rami fractures . Dr Marcelino is recommending to see if he can be evaluated by [facility] for rehab and in house HD.   9:15 am: Called [facility] at [phone] and left a message for the admissions coordinator to call me back @ [phone]. Awaiting a call back.  9:30 am: Spoke to [employer] admission coordinator from [place] and she said that she needs a note from the MD stating that the patient is not on palliative care. MD made aware. RNCM will continue to follow for discharge planning needs.  4:03 pm: Spoke to [employer] with [facility]. Patient has been accepted and can be transferred once Auth is Approved. Sent a Secure chat to [employer] requesting Insurance Auth to be done.  4:20 pm: Received a call from Briana informing me that the patient needs to have a CXR done to R/O TB. MD made aware.                      Expected Discharge Plan and Services                                               Social Drivers of Health (SDOH) Interventions SDOH Screenings   Food Insecurity: Patient Declined (04/08/2024)  Housing: Unknown (04/08/2024)  Transportation Needs: Patient Declined (04/08/2024)  Utilities: Patient Declined (04/08/2024)  Alcohol Screen: Low Risk  (06/22/2023)  Depression (PHQ2-9): Medium Risk (03/30/2024)  Financial Resource Strain: Low Risk  (06/23/2023)  Physical Activity: Inactive (06/23/2023)  Social Connections: Unknown (04/08/2024)  Stress: No Stress Concern Present (06/23/2023)  Tobacco Use: Medium Risk (03/30/2024)   Health Literacy: Adequate Health Literacy (06/23/2023)    Readmission Risk Interventions    11/07/2023   10:12 AM  Readmission Risk Prevention Plan  Transportation Screening Complete  Medication Review (RN Care Manager) Complete  PCP or Specialist appointment within 3-5 days of discharge Complete  SW Recovery Care/Counseling Consult Complete  Palliative Care Screening Not Applicable  Skilled Nursing Facility Not Applicable

## 2024-04-11 NOTE — Consult Note (Signed)
 WOC Nurse Consult Note: Reason for Consult: L forearm wound post fall  Wound type: partial thickness skin loss r/t fall 11/8 Pressure Injury POA: NA  Measurement: see nursing flowsheet  Wound bed: pink and yellow; several steri strips noted  Drainage (amount, consistency, odor) see nursing flowsheet  Periwound: ecchymosis  Dressing procedure/placement/frequency: Cleanse L arm skin tear with NS, apply Mupirocin ointment to area daily then cover with Vaseline gauze Soila [phone]) and secure with silicone foam or Kerlix roll gauze whichever is preferred.  Soak dressing with normal saline if adhered to wound bed for atraumatic removal.   POC discussed with bedside nurse. WOC team will not follow Re-consult if further needs arise.   Thank you,    Powell Bar MSN, RN-BC, [employer]

## 2024-04-11 NOTE — Progress Notes (Signed)
 Hemodialysis Note:  Received patient in bed to unit. Alert and oriented. Informed consent singed and in chart.  Treatment initiated: 0830 Treatment completed: 1210  Access used: Righty Subclavian catheter Access issues: None  Patient tolerated well. Transported back to room, alert without acute distress. Report given to patient's RN.  Total UF removed: 1.5 Liters Medications given: None  Post HD weight: 84.7 kg  [facility]

## 2024-04-11 NOTE — Progress Notes (Addendum)
 PT Cancellation Note  Patient Details Name: Ronald [name] MRN: [id number] DOB: 10-Jan-1946   Cancelled Treatment:     Pt is currently off floor for HD. Acute PT will continue to follow and progress per current POC. Will return later time/date when pt is available to participate in acute PT session.   2nd attempt. Pt was long sitting in bed upon arrival. K+ 5.8. He refuses PT at this time. I just don't feel good and need to have a BM. Author assisted RN staff with getting on bedpan. Acute PT will return at a later time/date as requested.    Ronald [name] 04/11/2024, 8:31 AM

## 2024-04-11 NOTE — Progress Notes (Signed)
 PATIENT NAMENivek [name]    MR#:  [id number]  DATE OF BIRTH:  [birth year]  SUBJECTIVE:   Wife at bedside. Pt eating lunch. Could not sit for too long for HD. No cp or sob. Weaning oxygen down   VITALS:  Blood pressure (!) 112/49, pulse 88, temperature 97.7 F (36.5 C), temperature source Oral, resp. rate 12, height 5' 9 (1.753 m), weight 84.7 kg, SpO2 100%.  PHYSICAL EXAMINATION:   GENERAL:  78 y.o.-year-old patient with no acute distress. Ronald [name], weak LUNGS: Normal breath sounds bilaterally, no wheezing CARDIOVASCULAR: S1, S2 normal. No murmur   ABDOMEN: Soft, nontender, nondistended.Bowel sounds present.  EXTREMITIES: No  edema b/l.   HD access + NEUROLOGIC: nonfocal  patient is alert and awake  LABORATORY PANEL:  CBC Recent Labs  Lab 04/11/24 0828  WBC 5.8  HGB 10.3*  HCT 31.5*  PLT 90*    Chemistries  Recent Labs  Lab 04/11/24 0828  NA 132*  K 5.7*  CL 91*  CO2 27  GLUCOSE 98  BUN 54*  CREATININE 7.11*  CALCIUM  8.7*    Assessment and Plan From H and P78 y.o. 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 tach on amiodarone , not on AC, being admitted with a left acetabular and pubic rami fracture, resulting from a presumed accidental fall in a parking lot when his legs just  gave out.  Denies hitting his head or passing out.   Trauma imaging including CT head C-spine which were nonacute CT hip showed a left acetabular fracture as well as left superior and inferior pubic rami fractures   Closed fracture of multiple pubic rami, left, sequela Closed left acetabular fracture --Non-operative management, toe-touch left side approved by orthopedic.  -- Patient was lethargic and had low blood pressure so PT and OT did not see on 11/9.  -- Physical therapy  recommending rehab.   --Transitional care team to look into bed offers and insurance authorization.  -- Pain control and switch over to Tylenol  as quickly as possible.    Ground level fall, undetermined but presumed accidental --PT and OT evaluations appreciated   Chronic hypotension --Continue midodrine     Chronic HFrEF (heart failure with reduced ejection fraction) (HCC) Ischemic cardiomyopathy, EF 20 to 25%, not an AICD candidate History of V. Tach GDMT intolerant due to chronic hypotension --Clinically euvolemic   Persistent atrial fibrillation (HCC) --Not currently on anticoagulation --Continue amiodarone  and aspirin    CAD S/P CABG (1998, 2007), subsequent stents --No acute concerns, no complaints of chest pain and EKG is nonacute --Continue aspirin  and atorvastatin    ESRD on hemodialysis [facility]) --Dialysis done yesterday.  As per nephrology will need to sit up in chair to do outpatient dialysis.  --pt unable to tolerate sitting HD in chair at present per Ronald ML  Hyponatremia Sodium a couple points less than the normal range.   Overweight BMI 28.45 with current height and weight in computer.   Thrombocytopenia --Chronic in nature   Thoracic aortic aneurysm (TAA) --No acute issues suspected at this time.   Spinal stenosis of lumbar region without neurogenic claudication --No acute issues, pain control  Spoke with pt's [name] in the room. Pt was evaluated by [facility] in June 2025, a f/u phone call in Aug was made by wife. At  present wife is NOT keen on continuing palliative care services. Pt for now is not on Palliative care service.  Procedures: Family communication :[name] Consults :orthopedic, nephrology CODE STATUS: DNR DVT Prophylaxis :heparin  Level of care: Med-Surg Status is: Inpatient Remains inpatient appropriate because: awaiting d/c planning    TOTAL TIME TAKING CARE OF THIS PATIENT: 40 minutes.  >50% time spent on  counselling and coordination of care  Note: This dictation was prepared with Dragon dictation along with smaller phrase technology. Any transcriptional errors that result from this process are unintentional.  Ronald [name]    [facility]   CC: Primary care physician; Ronald [name]

## 2024-04-11 NOTE — Progress Notes (Signed)
 [facility]  ROUNDING NOTE   Subjective:   CHRITOPHER [name] is a 78 year old male with history of hypertension, coronary artery disease, atrial fibrillation, congestive heart failure, history of nonsustained V. tach and end-stage renal disease on hemodialysis now comes to the emergency room with a fall. Patient has been admitted under observation for PAF (paroxysmal atrial fibrillation) (HCC) [I48.0] ESRD on hemodialysis (HCC) [N18.6, Z99.2] Closed nondisplaced fracture of left acetabulum, unspecified portion of acetabulum, initial encounter (HCC) [S32.402A] Closed fracture of multiple pubic rami, initial encounter (HCC) [S32.599A] Closed fracture of multiple pubic rami, left, sequela [S32.592S]  Patient is known to our practice and receives outpatient dialysis treatments at [facility].   Update: Patient seen and evaluated during dialysis   HEMODIALYSIS FLOWSHEET:  Blood Flow Rate (mL/min): 399 mL/min Arterial Pressure (mmHg): -195.75 mmHg Venous Pressure (mmHg): 122.01 mmHg TMP (mmHg): 7.07 mmHg Ultrafiltration Rate (mL/min): 686 mL/min Dialysate Flow Rate (mL/min): 299 ml/min  Unable to tolerate sitting in a chair for treatment today  Objective:  Vital signs in last 24 hours:  Temp:  [97.3 F (36.3 C)-97.9 F (36.6 C)] 97.3 F (36.3 C) (11/12 0814) Pulse Rate:  [73-85] 85 (11/12 1000) Resp:  [10-19] 10 (11/12 1000) BP: (106-117)/(45-56) 117/48 (11/12 1000) SpO2:  [97 %-100 %] 100 % (11/12 1000) Weight:  [86.2 kg] 86.2 kg (11/12 0814)  Weight change:  Filed Weights   04/09/24 1126 04/09/24 1143 04/11/24 0814  Weight: 88.7 kg 87.4 kg 86.2 kg    Intake/Output: I/O last 3 completed shifts: In: 1080 [P.O.:1080] Out: -    Intake/Output this shift:  No intake/output data recorded.  Physical Exam: General: NAD  Head: Normocephalic, atraumatic. Moist oral mucosal membranes  Eyes: Anicteric  Lungs:  Clear,  [place] O2  Heart: Regular rate and rhythm   Abdomen:  Soft, nontender  Extremities:  1+ peripheral edema.  Neurologic: Awake, alert, conversant  Skin: Warm,dry, no rash  Access: Rt internal jugular permcath    Basic Metabolic Panel: Recent Labs  Lab 04/07/24 2002 04/10/24 0517 04/11/24 0828  NA 138 133* 132*  K 4.0 5.0 5.7*  CL 96* 92* 91*  CO2 26 27 27   GLUCOSE 191* 87 98  BUN 42* 40* 54*  CREATININE 5.80* 5.45* 7.11*  CALCIUM  9.0 8.5* 8.7*  PHOS  --  6.2* 7.5*    Liver Function Tests: Recent Labs  Lab 04/11/24 0828  ALBUMIN 3.4*   No results for input(s): LIPASE, AMYLASE in the last 168 hours. No results for input(s): AMMONIA in the last 168 hours.  CBC: Recent Labs  Lab 04/07/24 2002 04/10/24 0517 04/11/24 0828  WBC 5.9 5.4 5.8  NEUTROABS 4.4  --   --   HGB 11.3* 10.2* 10.3*  HCT 34.4* 31.4* 31.5*  MCV 94.5 94.0 94.9  PLT 99* 77* 90*    Cardiac Enzymes: No results for input(s): CKTOTAL, CKMB, CKMBINDEX, TROPONINI in the last 168 hours.  BNP: Invalid input(s): POCBNP  CBG: No results for input(s): GLUCAP in the last 168 hours.  Microbiology: Results for orders placed or performed during the hospital encounter of 11/06/23  MRSA Next Gen by PCR, Nasal     Status: None   Collection Time: 11/07/23 10:47 AM   Specimen: Nasal Mucosa; Nasal Swab  Result Value Ref Range Status   MRSA by PCR Next Gen NOT DETECTED NOT DETECTED Final    Comment: (NOTE) The GeneXpert MRSA Assay (FDA approved for NASAL specimens only), is one component of a comprehensive MRSA  colonization surveillance program. It is not intended to diagnose MRSA infection nor to guide or monitor treatment for MRSA infections. Test performance is not FDA approved in patients less than 41 years old. Performed at [facility], [address] [place], [place] [address]     Coagulation Studies: No results for input(s): LABPROT, INR in the last 72 hours.  Urinalysis: No results for input(s):  COLORURINE, LABSPEC, PHURINE, GLUCOSEU, HGBUR, BILIRUBINUR, KETONESUR, PROTEINUR, UROBILINOGEN, NITRITE, LEUKOCYTESUR in the last 72 hours.  Invalid input(s): APPERANCEUR    Imaging: No results found.    Medications:     amiodarone   400 mg Oral Daily   aspirin  EC  81 mg Oral Daily   atorvastatin   40 mg Oral Daily   Chlorhexidine  Gluconate Cloth  6 each Topical Q0600   doxepin   10 mg Oral QHS   gabapentin   100 mg Oral QHS   heparin  injection (subcutaneous)  5,000 Units Subcutaneous Q8H   levothyroxine   75 mcg Oral Q0600   midodrine   10 mg Oral TID WC   pantoprazole   40 mg Oral BID   polyethylene glycol  17 g Oral BID   sodium chloride  flush  10-40 mL Intracatheter Q12H   acetaminophen  **OR** acetaminophen , albuterol , HYDROcodone-acetaminophen , nitroGLYCERIN , ondansetron  **OR** ondansetron  (ZOFRAN ) IV, sodium chloride  flush  Assessment/ Plan:  Ronald [name] is a 78 y.o.  male with history of hypertension, coronary artery disease, atrial fibrillation, congestive heart failure, history of nonsustained V. tach and end-stage renal disease on hemodialysis now comes to the emergency room with a fall. Patient has been admitted under observation for PAF (paroxysmal atrial fibrillation) (HCC) [I48.0] ESRD on hemodialysis (HCC) [N18.6, Z99.2] Closed nondisplaced fracture of left acetabulum, unspecified portion of acetabulum, initial encounter (HCC) [S32.402A] Closed fracture of multiple pubic rami, initial encounter (HCC) [S32.599A] Closed fracture of multiple pubic rami, left, sequela [S32.592S]  [facility] MWF/Rt Permcath  End stage renal disease on hemodialysis. Receiving dialysis today, UF goal 1.5-2L as tolerated. Next treatment scheduled for Friday. Unable to tolerate chair position for dialysis today. Unless placed at [facility] for rehab, patient will need to demonstrate sitting in a chair for outpatient dialysis.   Fracture, closed. Pubic  rami fracture with left acetabular fracture. Seen on CT. Orthopedics consulted, no surgical intervention. Continue pain management and PT  3. Anemia of chronic kidney disease Lab Results  Component Value Date   HGB 10.3 (L) 04/11/2024    Hgb remains stable   4. Secondary Hyperparathyroidism: with outpatient labs: PTH 280, phos 6.3, and Calcium  8.3 on 03/12/24   Lab Results  Component Value Date   CALCIUM  8.7 (L) 04/11/2024   PHOS 7.5 (H) 04/11/2024  Calcium  acceptable, phos elevated. Will continue sevelamer with meals during this admission.     LOS: 2 Ronald [name] 11/12/202510:26 AM

## 2024-04-12 ENCOUNTER — Inpatient Hospital Stay

## 2024-04-12 DIAGNOSIS — S32592A Other specified fracture of left pubis, initial encounter for closed fracture: Secondary | ICD-10-CM | POA: Diagnosis not present

## 2024-04-12 DIAGNOSIS — S32592S Other specified fracture of left pubis, sequela: Secondary | ICD-10-CM | POA: Diagnosis not present

## 2024-04-12 LAB — BASIC METABOLIC PANEL WITH GFR
Anion gap: 12 (ref 5–15)
BUN: 36 mg/dL — ABNORMAL HIGH (ref 8–23)
CO2: 25 mmol/L (ref 22–32)
Calcium: 8.8 mg/dL — ABNORMAL LOW (ref 8.9–10.3)
Chloride: 94 mmol/L — ABNORMAL LOW (ref 98–111)
Creatinine, Ser: 5.19 mg/dL — ABNORMAL HIGH (ref 0.61–1.24)
GFR, Estimated: 11 mL/min — ABNORMAL LOW (ref >60)
Glucose, Bld: 136 mg/dL — ABNORMAL HIGH (ref 70–99)
Potassium: 5.1 mmol/L (ref 3.5–5.1)
Sodium: 131 mmol/L — ABNORMAL LOW (ref 135–145)

## 2024-04-12 LAB — CBC
HCT: 30.3 % — ABNORMAL LOW (ref 39.0–52.0)
Hemoglobin: 10.1 g/dL — ABNORMAL LOW (ref 13.0–17.0)
MCH: 31.1 pg (ref 26.0–34.0)
MCHC: 33.3 g/dL (ref 30.0–36.0)
MCV: 93.2 fL (ref 80.0–100.0)
Platelets: 91 K/uL — ABNORMAL LOW (ref 150–400)
RBC: 3.25 MIL/uL — ABNORMAL LOW (ref 4.22–5.81)
RDW: 17.1 % — ABNORMAL HIGH (ref 11.5–15.5)
WBC: 5.2 K/uL (ref 4.0–10.5)
nRBC: 0 % (ref 0.0–0.2)

## 2024-04-12 NOTE — Progress Notes (Signed)
 Report given to Madelin Finder (LPN), [employer] [facility] and [facility] on [phone]  . Handed over patient belongings. Awaiting transport

## 2024-04-12 NOTE — Progress Notes (Signed)
 D/C order noted. Contacted [facility] MWF advised of pt and d/c today.  Requested documents faxed to clinic for continuation of care.   Suzen Satchel  Dialysis Navigator  [phone]

## 2024-04-12 NOTE — Progress Notes (Signed)
 [facility]  ROUNDING NOTE   Subjective:   Ronald [name] is a 78 year old male with history of hypertension, coronary artery disease, atrial fibrillation, congestive heart failure, history of nonsustained V. tach and end-stage renal disease on hemodialysis now comes to the emergency room with a fall. Patient has been admitted under observation for PAF (paroxysmal atrial fibrillation) (HCC) [I48.0] ESRD on hemodialysis (HCC) [N18.6, Z99.2] Closed nondisplaced fracture of left acetabulum, unspecified portion of acetabulum, initial encounter (HCC) [S32.402A] Closed fracture of multiple pubic rami, initial encounter (HCC) [S32.599A] Closed fracture of multiple pubic rami, left, sequela [S32.592S]  Patient is known to our practice and receives outpatient dialysis treatments at [facility].   Update: Patient seen sitting up in bed, about 60 degrees Wife at bedside Hopeful patient will get into [facility] for rehab Appetite appropriate   Objective:  Vital signs in last 24 hours:  Temp:  [98.1 F (36.7 C)-98.8 F (37.1 C)] 98.8 F (37.1 C) (11/13 0851) Pulse Rate:  [83-99] 91 (11/13 0851) Resp:  [16-19] 16 (11/13 0851) BP: (109-117)/(43-54) 117/54 (11/13 0851) SpO2:  [93 %-96 %] 93 % (11/13 0851) Weight:  [88.5 kg] 88.5 kg (11/13 0500)  Weight change:  Filed Weights   04/11/24 0814 04/11/24 1210 04/12/24 0500  Weight: 86.2 kg 84.7 kg 88.5 kg    Intake/Output: I/O last 3 completed shifts: In: 480 [P.O.:480] Out: 1500 [Other:1500]   Intake/Output this shift:  Total I/O In: 120 [P.O.:120] Out: -   Physical Exam: General: NAD  Head: Normocephalic, atraumatic. Moist oral mucosal membranes  Eyes: Anicteric  Lungs:  Clear,  [place] O2  Heart: Regular rate and rhythm  Abdomen:  Soft, nontender  Extremities:  1+ peripheral edema.  Neurologic: Awake, alert, conversant  Skin: Warm,dry, no rash  Access: Rt internal jugular permcath    Basic Metabolic Panel: Recent  Labs  Lab 04/07/24 2002 04/10/24 0517 04/11/24 0828 04/12/24 1024  NA 138 133* 132* 131*  K 4.0 5.0 5.7* 5.1  CL 96* 92* 91* 94*  CO2 26 27 27 25   GLUCOSE 191* 87 98 136*  BUN 42* 40* 54* 36*  CREATININE 5.80* 5.45* 7.11* 5.19*  CALCIUM  9.0 8.5* 8.7* 8.8*  PHOS  --  6.2* 7.5*  --     Liver Function Tests: Recent Labs  Lab 04/11/24 0828  ALBUMIN 3.4*   No results for input(s): LIPASE, AMYLASE in the last 168 hours. No results for input(s): AMMONIA in the last 168 hours.  CBC: Recent Labs  Lab 04/07/24 2002 04/10/24 0517 04/11/24 0828 04/12/24 1024  WBC 5.9 5.4 5.8 5.2  NEUTROABS 4.4  --   --   --   HGB 11.3* 10.2* 10.3* 10.1*  HCT 34.4* 31.4* 31.5* 30.3*  MCV 94.5 94.0 94.9 93.2  PLT 99* 77* 90* 91*    Cardiac Enzymes: No results for input(s): CKTOTAL, CKMB, CKMBINDEX, TROPONINI in the last 168 hours.  BNP: Invalid input(s): POCBNP  CBG: No results for input(s): GLUCAP in the last 168 hours.  Microbiology: Results for orders placed or performed during the hospital encounter of 11/06/23  MRSA Next Gen by PCR, Nasal     Status: None   Collection Time: 11/07/23 10:47 AM   Specimen: Nasal Mucosa; Nasal Swab  Result Value Ref Range Status   MRSA by PCR Next Gen NOT DETECTED NOT DETECTED Final    Comment: (NOTE) The GeneXpert MRSA Assay (FDA approved for NASAL specimens only), is one component of a comprehensive MRSA colonization surveillance program.  It is not intended to diagnose MRSA infection nor to guide or monitor treatment for MRSA infections. Test performance is not FDA approved in patients less than 42 years old. Performed at [facility], [address] [place], [place] [address]     Coagulation Studies: No results for input(s): LABPROT, INR in the last 72 hours.  Urinalysis: No results for input(s): COLORURINE, LABSPEC, PHURINE, GLUCOSEU, HGBUR, BILIRUBINUR, KETONESUR, PROTEINUR,  UROBILINOGEN, NITRITE, LEUKOCYTESUR in the last 72 hours.  Invalid input(s): APPERANCEUR    Imaging: No results found.    Medications:     amiodarone   400 mg Oral Daily   aspirin  EC  81 mg Oral Daily   atorvastatin   40 mg Oral Daily   Chlorhexidine  Gluconate Cloth  6 each Topical Q0600   doxepin   10 mg Oral QHS   gabapentin   100 mg Oral QHS   heparin  injection (subcutaneous)  5,000 Units Subcutaneous Q8H   levothyroxine   75 mcg Oral Q0600   midodrine   10 mg Oral TID WC   mupirocin ointment   Topical Daily   pantoprazole   40 mg Oral BID   polyethylene glycol  17 g Oral BID   sevelamer carbonate  800 mg Oral TID WC   sodium chloride  flush  10-40 mL Intracatheter Q12H   acetaminophen  **OR** acetaminophen , albuterol , bisacodyl , HYDROcodone-acetaminophen , nitroGLYCERIN , ondansetron  **OR** ondansetron  (ZOFRAN ) IV, sodium chloride  flush  Assessment/ Plan:  Ronald [name] is a 78 y.o.  male with history of hypertension, coronary artery disease, atrial fibrillation, congestive heart failure, history of nonsustained V. tach and end-stage renal disease on hemodialysis now comes to the emergency room with a fall. Patient has been admitted under observation for PAF (paroxysmal atrial fibrillation) (HCC) [I48.0] ESRD on hemodialysis (HCC) [N18.6, Z99.2] Closed nondisplaced fracture of left acetabulum, unspecified portion of acetabulum, initial encounter (HCC) [S32.402A] Closed fracture of multiple pubic rami, initial encounter (HCC) [S32.599A] Closed fracture of multiple pubic rami, left, sequela [S32.592S]  [facility] MWF/Rt Permcath  End stage renal disease on hemodialysis. Dialysis received yesterday, UF 1.5L as tolerated. Next treatment scheduled for Friday.   Fracture, closed. Pubic rami fracture with left acetabular fracture. Seen on CT. Orthopedics consulted, no surgical intervention. Continue pain management and PT  3. Anemia of chronic kidney disease Lab  Results  Component Value Date   HGB 10.1 (L) 04/12/2024    Hgb within desired range  4. Secondary Hyperparathyroidism: with outpatient labs: PTH 280, phos 6.3, and Calcium  8.3 on 03/12/24   Lab Results  Component Value Date   CALCIUM  8.8 (L) 04/12/2024   PHOS 7.5 (H) 04/11/2024  Continue sevelamer with meals during this admission.     LOS: 3 Kem Parcher 11/13/20252:21 PM

## 2024-04-12 NOTE — Discharge Summary (Signed)
 Physician Discharge Summary  Ronald [name] [facility] FMW:[id number] DOB: November 18, 1945 DOA: 04/07/2024  PCP: [name], MD  Admit date: 04/07/2024 Discharge date: 04/12/2024  Admitted From: home  Disposition:  SNF  Recommendations for Outpatient Follow-up:  Follow up with PCP in 1-2 weeks F/u w/ ortho surg, Dr. Lorelle, in 10-14 days Toe touch left side as per ortho surg.  Home Health: no  Equipment/Devices:  Discharge Condition: stable  CODE STATUS: DNR Diet recommendation: Heart Healthy   Brief/Interim Summary: HPI was taken from Dr. Cleatus: [place] ORN Credit is a 78 y.o. 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 tach on amiodarone , not on [facility], currently being followed by palliative care, being admitted with a left acetabular and pubic rami fracture, resulting from a presumed accidental fall in a parking lot when his legs just  gave out.  Denies hitting his head or passing out.  Denied preceding chest pain, palpitations or shortness of breath or lightheadedness, visual disturbance or headache. In the ED, vitals unremarkable CBC notable forBaseline anemia of 11.3 and baseline thrombocytopenia of 99 BMP in keeping with dialysis status but otherwise unremarkable EKG showed A-fib at 101 with nonspecific T wave changes   Trauma imaging including CT head C-spine which were nonacute CT hip showed a left acetabular fracture as well as left superior and inferior pubic rami fractures   The ED provider spoke with orthopedics who said non operative  for now, recommended toe-touch weightbearing and rolling walker at all times   Got a couple of doses of morphine  in the ED   Admission requested for pain control and to arrange for SNF/rehab   Discharge Diagnoses:  Principal Problem:    Closed fracture of multiple pubic rami, left, sequela Active Problems:   Closed left acetabular fracture (HCC)   Ground level fall, undetermined but presumed accidental   Chronic hypotension   History of ventricular tachycardia   Ischemic cardiomyopathy (EF 20 to 25%), not an AICD candidate   CAD S/P CABG (1998, 2007), subsequent stents   Persistent atrial fibrillation (HCC)   Chronic HFrEF (heart failure with reduced ejection fraction) (HCC)   ESRD on hemodialysis (HCC)   Spinal stenosis of lumbar region without neurogenic claudication   Thoracic aortic aneurysm (TAA)   Thrombocytopenia   Overweight   Hyponatremia  Closed left fracture of multiple pubic rami & closed left acetabular fracture: non-operative management & toe touch left side as per ortho surg. PT/OT recs SNF    Ground level fall: undetermined but presumed accidental. PT/OT recs SNF   Chronic hypotension: continue on midodrine    Chronic systolic CHF: w/ hx of ischemic cardiomyopathy. Not an AICD candidate. GDMT intolerant due to chronic hypotension. Appears compensated. Monitor I/Os.   Persistent a.fib: continue on amio, aspirin . Hx of v. tach   Hx of CAD: s/ CABG (1998, 2007) & subsequent stents. Continue on statin, aspirin     ESRD: on HD. Nephro following and recs apprec    Hyponatremia: will be managed w/ HD   Overweight: BMI 28.8. Would benefit from weight loss  Thrombocytopenia: chronic. Labile   Thoracic aortic aneurysm: will need to f/u outpatient for further imaging    Spinal stenosis of lumbar region: tylenol  prn. Continue on home dose of gabapentin    Discharge Instructions  Discharge Instructions  Diet - low sodium heart healthy   Complete by: As directed    Discharge instructions   Complete by: As directed    F/u w/ PCP in 1-2 weeks. F/u w/ ortho surg, Dr. Lorelle, in 10-14 days.   Discharge wound care:   Complete by: As directed    Wound care  Daily      Comments: Cleanse L arm/elbow  skin tear with NS, apply Mupirocin ointment to wound bed daily then cover with Vaseline gauze Soila [phone]) and secure with silicone foam or Kerlix roll gauze whichever is preferred.  Soak dressing with normal saline if adhered to wound bed for atraumatic removal.   Increase activity slowly   Complete by: As directed       Allergies as of 04/12/2024       Reactions   Predicort [prednisolone] Other (See Comments)   Stomach pain   Ciprofloxacin Other (See Comments)   GI upset   Hydrochlorothiazide Other (See Comments)   Dehydration   Hydrocodone Nausea Only, Other (See Comments)   Stomach upset   Hydrocodone-acetaminophen  Nausea Only   Stomach upset   Sulfa Antibiotics Other (See Comments)   Cannot recall   Penicillins Hives, Rash, Other (See Comments)   Has patient had a PCN reaction causing immediate rash, facial/tongue/throat swelling, SOB or lightheadedness with hypotension: YES Has patient had a PCN reaction causing severe rash involving mucus membranes or skin necrosis: NO Has patient had a PCN reaction that required hospitalization NO Has patient had a PCN reaction occurring within the last 10 years:NO If all of the above answers are NO, then may proceed with Cephalosporin use. Has patient had a PCN reaction causing immediate rash, facial/tongue/throat swelling, SOB or lightheadedness with hypotension: YES Has patient had a PCN reaction causing severe rash involving mucus membranes or skin necrosis: NO Has patient had a PCN reaction that required hospitalization NO Has patient had a PCN reaction occurring within the last 10 years:NO If all of the above answers are NO, then may proceed with Cephalosporin use.        Medication List     STOP taking these medications    ondansetron  4 MG tablet Commonly known as: ZOFRAN        TAKE these medications    allopurinol  100 MG tablet Commonly known as: ZYLOPRIM  TAKE 1 TABLET BY MOUTH EVERYDAY AT BEDTIME   amiodarone  200  MG tablet Commonly known as: PACERONE  Take 2 tablets (400 mg total) by mouth daily.   aspirin  EC 81 MG tablet Take 1 tablet (81 mg total) by mouth daily. Swallow whole.   atorvastatin  40 MG tablet Commonly known as: LIPITOR Take 1 tablet (40 mg total) by mouth daily.   doxepin  10 MG capsule Commonly known as: SINEQUAN  TAKE 1 CAPSULE BY MOUTH AT BEDTIME.   furosemide  20 MG tablet Commonly known as: LASIX  Take 60 mg by mouth daily as needed for edema.   gabapentin  100 MG capsule Commonly known as: NEURONTIN  Take 100 mg by mouth at bedtime.   levothyroxine  75 MCG tablet Commonly known as: SYNTHROID  TAKE 1 TABLET BY MOUTH EVERY DAY BEFORE BREAKFAST   midodrine  5 MG tablet Commonly known as: PROAMATINE  Take 1 tablet (5 mg total) by mouth 3 (three) times daily with meals.   MULTIVITAMIN PO Take 1 tablet by mouth daily.   nitroGLYCERIN  0.4 MG SL tablet Commonly known as: NITROSTAT  Place 0.4 mg under the tongue every 5 (five) minutes as needed for chest pain (Up to 3  times).   pantoprazole  40 MG tablet Commonly known as: PROTONIX  TAKE 1 TABLET (40 MG TOTAL) BY MOUTH 2 (TWO) TIMES DAILY. HOME MED.   senna 8.6 MG tablet Commonly known as: SENOKOT Take 1 tablet by mouth as needed for constipation.   triamcinolone  0.1% oint-Cerave equivalent lotion 1:1 mixture Apply topically 2 (two) times daily.   triamcinolone  ointment 0.1 % Commonly known as: KENALOG  Apply 1 Application topically 2 (two) times daily.   Vitamin D -3 125 MCG (5000 UT) Tabs Take 2,000 Units by mouth daily.               Discharge Care Instructions  (From admission, onward)           Start     Ordered   04/12/24 0000  Discharge wound care:       Comments: Wound care  Daily      Comments: Cleanse L arm/elbow skin tear with NS, apply Mupirocin ointment to wound bed daily then cover with Vaseline gauze Soila [phone]) and secure with silicone foam or Kerlix roll gauze whichever is preferred.   Soak dressing with normal saline if adhered to wound bed for atraumatic removal.   04/12/24 1446            Contact information for after-discharge care     Destination     [facility] .   Service: Skilled Nursing Contact information: [address] [place]  [phone]                    Allergies  Allergen Reactions   Predicort [Prednisolone] Other (See Comments)    Stomach pain   Ciprofloxacin Other (See Comments)    GI upset   Hydrochlorothiazide Other (See Comments)    Dehydration   Hydrocodone Nausea Only and Other (See Comments)    Stomach upset    Hydrocodone-Acetaminophen  Nausea Only    Stomach upset   Sulfa Antibiotics Other (See Comments)    Cannot recall   Penicillins Hives, Rash and Other (See Comments)    Has patient had a PCN reaction causing immediate rash, facial/tongue/throat swelling, SOB or lightheadedness with hypotension: YES  Has patient had a PCN reaction causing severe rash involving mucus membranes or skin necrosis: NO  Has patient had a PCN reaction that required hospitalization NO  Has patient had a PCN reaction occurring within the last 10 years:NO  If all of the above answers are NO, then may proceed with Cephalosporin use.  Has patient had a PCN reaction causing immediate rash, facial/tongue/throat swelling, SOB or lightheadedness with hypotension: YES Has patient had a PCN reaction causing severe rash involving mucus membranes or skin necrosis: NO Has patient had a PCN reaction that required hospitalization NO Has patient had a PCN reaction occurring within the last 10 years:NO If all of the above answers are NO, then may proceed with Cephalosporin use.    Consultations: Ortho surg    Procedures/Studies: CT Hip Left Wo Contrast Result Date: 04/07/2024 EXAM: CT OF THE LEFT HIP WITHOUT IV CONTRAST 04/07/2024 08:39:57 PM TECHNIQUE: CT of the left hip was performed without  the administration of intravenous contrast. Multiplanar reformatted images are provided for review. Automated exposure control, iterative reconstruction, and/or weight based adjustment of the mA/kV was utilized to reduce the radiation dose to as low as reasonably achievable. COMPARISON: None available. CLINICAL HISTORY: Hip trauma, fracture suspected, xray done. FINDINGS: BONES: Nondisplaced left superior pubic ramus fracture extending into the  anterior acetabulum, mildly comminuted. Mildly displaced left inferior pubic ramus fracture. Left proximal femur is intact. No aggressive appearing osseous abnormality or periostitis. SOFT TISSUE: No significant soft tissue edema or fluid collections. No soft tissue mass. JOINT: No significant degenerative changes. No osseous erosions. INTRAPELVIC CONTENTS: Limited images of the intrapelvic contents are unremarkable. IMPRESSION: 1. Mildly comminuted left acetabular fracture. 2. Left superior and inferior pubic rami fractures, as above. 3. Left hip is intact. Electronically signed by: Pinkie Pebbles MD 04/07/2024 08:46 PM EST RP Workstation: [id number]   CT Cervical Spine Wo Contrast Result Date: 04/07/2024 EXAM: CT CERVICAL SPINE WITHOUT CONTRAST 04/07/2024 08:39:57 PM TECHNIQUE: CT of the cervical spine was performed without the administration of intravenous contrast. Multiplanar reformatted images are provided for review. Automated exposure control, iterative reconstruction, and/or weight based adjustment of the mA/kV was utilized to reduce the radiation dose to as low as reasonably achievable. COMPARISON: None available. CLINICAL HISTORY: Neck trauma (Age >= 65y) FINDINGS: CERVICAL SPINE: BONES AND ALIGNMENT: No acute fracture or traumatic malalignment. DEGENERATIVE CHANGES: No significant degenerative changes. SOFT TISSUES: No prevertebral soft tissue swelling. LUNGS: Mild right apical pleural parenchymal scarring. IMPRESSION: 1. No acute abnormality of the cervical  spine. Electronically signed by: Pinkie Pebbles MD 04/07/2024 08:43 PM EST RP Workstation: [id number]   CT Head Wo Contrast Result Date: 04/07/2024 EXAM: CT HEAD WITHOUT CONTRAST 04/07/2024 08:39:57 PM TECHNIQUE: CT of the head was performed without the administration of intravenous contrast. Automated exposure control, iterative reconstruction, and/or weight based adjustment of the mA/kV was utilized to reduce the radiation dose to as low as reasonably achievable. COMPARISON: 09/29/2023. CLINICAL HISTORY: Head trauma, minor (Age >= 65y). FINDINGS: BRAIN AND VENTRICLES: No acute hemorrhage. No evidence of acute infarct. No hydrocephalus. No extra-axial collection. No mass effect or midline shift. Age-related atrophy. Mild periventricular chronic small vessel ischemia. Vascular calcifications. ORBITS: Bilateral cataract resection. SINUSES: No acute abnormality. SOFT TISSUES AND SKULL: No acute soft tissue abnormality. No skull fracture. IMPRESSION: 1. No acute intracranial abnormality. Electronically signed by: Pinkie Pebbles MD 04/07/2024 08:42 PM EST RP Workstation: [id number]   DG Hip Unilat W or Wo Pelvis 2-3 Views Left Result Date: 04/07/2024 EXAM: 2 or 3 VIEW(S) XRAY OF THE PELVIS AND LEFT HIP 04/07/2024 08:30:15 PM COMPARISON: X-rays 12/04/2014. CLINICAL HISTORY: Left hip pain after fall. FINDINGS: BONES: The bones are diffusely osteopenic. There is a healed left inferior pubic ramus fracture. The bones are diffusely osteopenic limiting evaluation for subtle nondisplaced fracture. No definite acute fracture identified. JOINTS: SI joints are symmetric. Bilateral hips demonstrate normal alignment. Joint spaces are maintained. No definite acute dislocation identified. SOFT TISSUES: There are peripheral vascular calcifications present. IMPRESSION: 1. No definite acute fracture or dislocation identified. 2. Diffuse osteopenia limits evaluation for subtle nondisplaced fracture. If there is high clinical  concern for occult fracture, consider further evaluation with CT. 3. Healed left inferior pubic ramus fracture. Electronically signed by: Greig Pique MD 04/07/2024 08:42 PM EST RP Workstation: [id number]   (Echo, Carotid, EGD, Colonoscopy, ERCP)    Subjective: pt c/o fatigue    Discharge Exam: Vitals:   04/12/24 0851 04/12/24 1434  BP: (!) 117/54 (!) 107/45  Pulse: 91 80  Resp: 16 19  Temp: 98.8 F (37.1 C) 98.1 F (36.7 C)  SpO2: 93% 98%   Vitals:   04/12/24 0500 04/12/24 0509 04/12/24 0851 04/12/24 1434  BP:  (!) 115/47 (!) 117/54 (!) 107/45  Pulse:  91 91 80  Resp:  19 16 19   Temp:  98.2 F (36.8 C) 98.8 F (37.1 C) 98.1 F (36.7 C)  TempSrc:  Oral Oral   SpO2:  93% 93% 98%  Weight: 88.5 kg     Height:        General: Pt is alert, awake, not in acute distress Cardiovascular: irregularly irregular, no rubs, no gallops Respiratory: CTA bilaterally, no wheezing, no rhonchi Abdominal: Soft, NT, ND, bowel sounds + Extremities: no cyanosis    The results of significant diagnostics from this hospitalization (including imaging, microbiology, ancillary and laboratory) are listed below for reference.     Microbiology: No results found for this or any previous visit (from the past 240 hours).   Labs: BNP (last 3 results) No results for input(s): BNP in the last 8760 hours. Basic Metabolic Panel: Recent Labs  Lab 04/07/24 2002 04/10/24 0517 04/11/24 0828 04/12/24 1024  NA 138 133* 132* 131*  K 4.0 5.0 5.7* 5.1  CL 96* 92* 91* 94*  CO2 26 27 27 25   GLUCOSE 191* 87 98 136*  BUN 42* 40* 54* 36*  CREATININE 5.80* 5.45* 7.11* 5.19*  CALCIUM  9.0 8.5* 8.7* 8.8*  PHOS  --  6.2* 7.5*  --    Liver Function Tests: Recent Labs  Lab 04/11/24 0828  ALBUMIN 3.4*   No results for input(s): LIPASE, AMYLASE in the last 168 hours. No results for input(s): AMMONIA in the last 168 hours. CBC: Recent Labs  Lab 04/07/24 2002 04/10/24 0517 04/11/24 0828  04/12/24 1024  WBC 5.9 5.4 5.8 5.2  NEUTROABS 4.4  --   --   --   HGB 11.3* 10.2* 10.3* 10.1*  HCT 34.4* 31.4* 31.5* 30.3*  MCV 94.5 94.0 94.9 93.2  PLT 99* 77* 90* 91*   Cardiac Enzymes: No results for input(s): CKTOTAL, CKMB, CKMBINDEX, TROPONINI in the last 168 hours. BNP: Invalid input(s): POCBNP CBG: No results for input(s): GLUCAP in the last 168 hours. D-Dimer No results for input(s): DDIMER in the last 72 hours. Hgb A1c No results for input(s): HGBA1C in the last 72 hours. Lipid Profile No results for input(s): CHOL, HDL, LDLCALC, TRIG, CHOLHDL, LDLDIRECT in the last 72 hours. Thyroid  function studies No results for input(s): TSH, T4TOTAL, T3FREE, THYROIDAB in the last 72 hours.  Invalid input(s): FREET3 Anemia work up No results for input(s): VITAMINB12, FOLATE, FERRITIN, TIBC, IRON, RETICCTPCT in the last 72 hours. Urinalysis    Component Value Date/Time   COLORURINE AMBER (A) 08/06/2023 1540   APPEARANCEUR CLOUDY (A) 08/06/2023 1540   LABSPEC 1.022 08/06/2023 1540   PHURINE 5.0 08/06/2023 1540   GLUCOSEU NEGATIVE 08/06/2023 1540   HGBUR SMALL (A) 08/06/2023 1540   BILIRUBINUR SMALL (A) 08/06/2023 1540   KETONESUR NEGATIVE 08/06/2023 1540   PROTEINUR >=300 (A) 08/06/2023 1540   NITRITE NEGATIVE 08/06/2023 1540   LEUKOCYTESUR SMALL (A) 08/06/2023 1540   Sepsis Labs Recent Labs  Lab 04/07/24 2002 04/10/24 0517 04/11/24 0828 04/12/24 1024  WBC 5.9 5.4 5.8 5.2   Microbiology No results found for this or any previous visit (from the past 240 hours).   Time coordinating discharge: 34 minutes  SIGNED:   Anthony CHRISTELLA Pouch, MD  [facility] 04/12/2024, 2:46 PM Pager   If 7PM-7AM, please contact night-coverage [email or web address]

## 2024-04-12 NOTE — Plan of Care (Signed)
   Problem: Nutrition: Goal: Adequate nutrition will be maintained Outcome: Progressing   Problem: Elimination: Goal: Will not experience complications related to bowel motility Outcome: Progressing   Problem: Safety: Goal: Ability to remain free from injury will improve Outcome: Progressing   Problem: Skin Integrity: Goal: Risk for impaired skin integrity will decrease Outcome: Progressing

## 2024-04-12 NOTE — Progress Notes (Signed)
 Physical Therapy Treatment Patient Details Name: Marina Desire [facility] MRN: [id number] DOB: 08-05-1945 Today's Date: 04/12/2024   History of Present Illness Pt is a 78 year old male currently being followed by palliative care, being admitted with a left acetabular and pubic rami fracture, resulting from a presumed accidental fall in a parking lot when his legs just  gave out.  He is to be TTWB with use of RW. PMH of coronary artery disease status post CABG x 2, ischemic cardiomyopathy with left ventricular ejection fraction of 20 to 25%, persistent a-fib, DM2, ESRD on hemodialysis, GERD, COPD.    PT Comments  PT/OT Co-Treatment for pt needing additional assistance. Arrived to pt supine in bed with HOB raised 20 deg. Pt had stated that he has been dealing with shakiness. Pt presented as A and O x4 and eager to try and do transfers. While pt was long sitting in bed, his BP was measured at 117/47 (69). Performed supine to sit with Mod A moving to EOB. Pt required cueing and assistance from PTA/OT with sliding chuck to prepare for STS. Pt performed STS to RW with Mod A +2. Cueing was required to remind pt that they are TTWB with their L LE. Pt completed partial squat with R LE 3x standing bed side.While pt was standing, his BP was measured as 111/50. Performed stand to sit transfer with Mod A +2. During second STS to RW, pt had tremors with attempt to transfer to recliner. It was determined that lateral scooting from the EOB to the recliner was the safest option. Pt required Min A and cueing to complete transfer. Discharge recs still apply. Continue with POC.   If plan is discharge home, recommend the following: A lot of help with walking and/or transfers;A lot of help with bathing/dressing/bathroom;Assist for transportation;Assistance with cooking/housework;Help with stairs or ramp for entrance   Can travel by private vehicle        Equipment Recommendations  Rolling walker (2 wheels)    Recommendations  for Other Services       Precautions / Restrictions Precautions Precautions: Fall Recall of Precautions/Restrictions: Impaired Precaution/Restrictions Comments: L TTWB Restrictions Weight Bearing Restrictions Per Provider Order: Yes LLE Weight Bearing Per Provider Order: Touchdown weight bearing     Mobility  Bed Mobility Overal bed mobility: Needs Assistance Bed Mobility: Supine to Sit     Supine to sit: Max assist, +2 for physical assistance, Used rails, HOB elevated     General bed mobility comments: Pt was able to scoot towards EOB with Min A. Required chuck pad to move further to prepare for STS    Transfers Overall transfer level: Needs assistance Equipment used: Rolling walker (2 wheels) Transfers: Sit to/from Stand, Bed to chair/wheelchair/BSC Sit to Stand: Mod assist, +2 physical assistance, From elevated surface          Lateral/Scoot Transfers: Min assist, From elevated surface (+1) General transfer comment: Required Mod A +2 with sit to stand to RW. Gave cues to try and look up and stand tall. Did have shakiness during transfer. Performed half squats 3 reps on first STS.         Balance Overall balance assessment: Needs assistance Sitting-balance support: Single extremity supported Sitting balance-Leahy Scale: Fair Sitting balance - Comments: CGA d/t tremors while seated EOB Postural control: Posterior lean Standing balance support: Bilateral upper extremity supported, Reliant on assistive device for balance Standing balance-Leahy Scale: Poor Standing balance comment: +2 external support and foot blocking to maintain TTWB in  LLE; RW use with heavy UE reliance however noted with BUE tremors and weakness          Communication Communication Communication: Impaired Factors Affecting Communication: Hearing impaired  Cognition Arousal: Alert Behavior During Therapy: WFL for tasks assessed/performed   PT - Cognitive impairments: No apparent  impairments         Following commands: Intact      Cueing Cueing Techniques: Verbal cues, Gestural cues, Tactile cues, Visual cues  Exercises      General Comments General comments (skin integrity, edema, etc.): Shakiness at times during transfers. Due to shakiness, on second sit to stand, it was determined that it was Marrion Accomando to do a scoot from bed to recliner.      Pertinent Vitals/Pain Pain Assessment Pain Assessment: No/denies pain Pain Score: 0-No pain     PT Goals (current goals can now be found in the care plan section) Acute Rehab PT Goals Patient Stated Goal: to get back home and ADLs Progress towards PT goals: Progressing toward goals    Frequency    Min 3X/week      PT Plan      Co-evaluation   Reason for Co-Treatment: For patient/therapist safety;To address functional/ADL transfers PT goals addressed during session: Balance;Mobility/safety with mobility OT goals addressed during session: ADL's and self-care      AM-PAC PT 6 Clicks Mobility   Outcome Measure  Help needed turning from your back to your side while in a flat bed without using bedrails?: A Lot Help needed moving from lying on your back to sitting on the side of a flat bed without using bedrails?: A Lot Help needed moving to and from a bed to a chair (including a wheelchair)?: A Lot Help needed standing up from a chair using your arms (e.g., wheelchair or bedside chair)?: A Lot Help needed to walk in hospital room?: Total Help needed climbing 3-5 steps with a railing? : Total 6 Click Score: 10    End of Session Equipment Utilized During Treatment: Gait belt Activity Tolerance: Patient limited by fatigue;Patient limited by pain Patient left: in chair;with call bell/phone within reach;with chair alarm set Nurse Communication: Mobility status PT Visit Diagnosis: Unsteadiness on feet (R26.81);Muscle weakness (generalized) (M62.81);Other abnormalities of gait and mobility (R26.89);History  of falling (Z91.81);Pain Pain - Right/Left: Left Pain - part of body: Hip     Time: 8645-8586 PT Time Calculation (min) (ACUTE ONLY): 19 min  Charges:      PT General Charges $$ ACUTE PT VISIT: 1 Visit                     Signe Shavone Nevers SPTA    Ashaad Gaertner 04/12/2024, 3:21 PM

## 2024-04-12 NOTE — TOC Transition Note (Signed)
 Transition of Care [facility]) - Discharge Note   Patient Details  Name: Ronald [name] [facility] MRN: [id number] Date of Birth: July 15, 1945  Transition of Care [facility]) CM/SW Contact:  Alvaro Louder, LCSW Phone Number: 04/12/2024, 4:21 PM   Clinical Narrative:   LCSWA received insurance approval for patient to admit to SNF [facility] . LCSWA confirmed with MD that patient is stable for discharge. LCSWA notified the patient and they are in agreement with discharge. LCSWA confirmed bed is available at SNF. Transport arranged with [facility] for next available.  Number to call report: [phone], RM E-5   TOC to follow for discharge  Final next level of care: Skilled Nursing Facility Barriers to Discharge: No Barriers Identified   Patient Goals and CMS Choice            Discharge Placement              Patient chooses bed at:  ([facility]) Patient to be transferred to facility by: [facility] Name of family member notified: Self/Spouse Patient and family notified of of transfer: 04/12/24  Discharge Plan and Services Additional resources added to the After Visit Summary for                                       Social Drivers of Health (SDOH) Interventions SDOH Screenings   Food Insecurity: Patient Declined (04/08/2024)  Housing: Unknown (04/08/2024)  Transportation Needs: Patient Declined (04/08/2024)  Utilities: Patient Declined (04/08/2024)  Alcohol Screen: Low Risk  (06/22/2023)  Depression (PHQ2-9): Medium Risk (03/30/2024)  Financial Resource Strain: Low Risk  (06/23/2023)  Physical Activity: Inactive (06/23/2023)  Social Connections: Unknown (04/08/2024)  Stress: No Stress Concern Present (06/23/2023)  Tobacco Use: Medium Risk (03/30/2024)  Health Literacy: Adequate Health Literacy (06/23/2023)     Readmission Risk Interventions    11/07/2023   10:12 AM  Readmission Risk Prevention Plan  Transportation Screening Complete  Medication Review (RN Care Manager) Complete  PCP or  Specialist appointment within 3-5 days of discharge Complete  SW Recovery Care/Counseling Consult Complete  Palliative Care Screening Not Applicable  Skilled Nursing Facility Not Applicable

## 2024-04-12 NOTE — Care Management Important Message (Signed)
 Important Message  Patient Details  Name: Ronald [name] [facility] MRN: [id number] Date of Birth: 1945/12/09   Important Message Given:  Yes - Medicare IM     Tavin Vernet W, CMA 04/12/2024, 2:29 PM

## 2024-04-12 NOTE — Progress Notes (Signed)
 Occupational Therapy Treatment Patient Details Name: Ronald [name] [facility] MRN: [id number] DOB: 09/18/1945 Today's Date: 04/12/2024   History of present illness Pt is a 78 year old male currently being followed by palliative care, being admitted with a left acetabular and pubic rami fracture, resulting from a presumed accidental fall in a parking lot when his legs just  gave out.  He is to be TTWB with use of RW. PMH of coronary artery disease status post CABG x 2, ischemic cardiomyopathy with left ventricular ejection fraction of 20 to 25%, persistent a-fib, DM2, ESRD on hemodialysis, GERD, COPD.   OT comments  Pt is supine in bed on arrival. Pleasant and agreeable to OT/PT co-treatment session to maximize pt/therapist safety. He has pain in LLE with movement, but tolerable. BP monitored by PTA and remained stable with no c/o of dizziness/nausea. Pt performed bed mobility with Max A x2, cues and increased time. Multiple STS from EOB to RW with bed height elevated requiring MOD A x2, cues for TTWB on LLE, foot blocking and lifting assist. Tolerated increased time in standing position this date with ability to squat on RLE. SPT attempted, but pt noted with increased shaking and weakness. Able to lateral scoot from bed to recliner with Min A x2 for safety and to maintain TTWB on LLE during transfers with cues for BUE use. Pt left in recliner with all needs in place and will cont to require skilled acute OT services to maximize his safety and IND to return to PLOF.       If plan is discharge home, recommend the following:  A lot of help with bathing/dressing/bathroom;Help with stairs or ramp for entrance;Two people to help with walking and/or transfers;A lot of help with walking and/or transfers   Equipment Recommendations  Other (comment) (defer to next venue)    Recommendations for Other Services      Precautions / Restrictions Precautions Precautions: Fall Recall of Precautions/Restrictions:  Impaired Restrictions Weight Bearing Restrictions Per Provider Order: Yes LLE Weight Bearing Per Provider Order: Touchdown weight bearing       Mobility Bed Mobility Overal bed mobility: Needs Assistance Bed Mobility: Supine to Sit     Supine to sit: Max assist, +2 for physical assistance, Used rails, HOB elevated     General bed mobility comments: increased assist and cues for bringing trunk upright as well as BLE management to EOB    Transfers Overall transfer level: Needs assistance Equipment used: Rolling walker (2 wheels) Transfers: Sit to/from Stand, Bed to chair/wheelchair/BSC Sit to Stand: Mod assist, +2 physical assistance, From elevated surface          Lateral/Scoot Transfers: Min assist, From elevated surface General transfer comment: able to stand from elevated bed height to RW and stand for ~45-60 seconds with cues for BUE use and maintaining TTWB on LLE; additional stand performed with attempt for SPT to recliner, however pt still too weak/unable to pivot; returned to seated EOB and able to demo lateral scoot transfer from bed to recliner with bed elevated and cueing for technique and holding LLE off the floor to prevent weight bearing     Balance Overall balance assessment: Needs assistance Sitting-balance support: Single extremity supported Sitting balance-Leahy Scale: Fair     Standing balance support: Bilateral upper extremity supported, Reliant on assistive device for balance Standing balance-Leahy Scale: Poor Standing balance comment: RW with +2 external support to maintain standing and TTWB of LLE, heavy UE reliance on RW with notable weakness and tremors  with standing                           ADL either performed or assessed with clinical judgement   ADL Overall ADL's : Needs assistance/impaired                                       General ADL Comments: declined ADLs, but anticipate Max A required d/t weakness and LLE  deficits    Extremity/Trunk Assessment              Vision       Perception     Praxis     Communication Communication Communication: Impaired Factors Affecting Communication: Hearing impaired   Cognition Arousal: Alert Behavior During Therapy: WFL for tasks assessed/performed Cognition: No apparent impairments                               Following commands: Intact        Cueing   Cueing Techniques: Verbal cues, Gestural cues, Tactile cues, Visual cues  Exercises      Shoulder Instructions       General Comments      Pertinent Vitals/ Pain       Pain Assessment Pain Assessment: Faces Faces Pain Scale: Hurts little more Pain Location: L hip/LLE Pain Descriptors / Indicators: Sore, Aching, Discomfort Pain Intervention(s): Monitored during session, Repositioned, Limited activity within patient's tolerance  Home Living                                          Prior Functioning/Environment              Frequency  Min 2X/week        Progress Toward Goals  OT Goals(current goals can now be found in the care plan section)  Progress towards OT goals: Progressing toward goals  Acute Rehab OT Goals Patient Stated Goal: improve pain OT Goal Formulation: With patient/family Time For Goal Achievement: 04/23/24 Potential to Achieve Goals: Fair  Plan      Co-evaluation    PT/OT/SLP Co-Evaluation/Treatment: Yes Reason for Co-Treatment: For patient/therapist safety;To address functional/ADL transfers PT goals addressed during session: Balance;Mobility/safety with mobility OT goals addressed during session: ADL's and self-care      AM-PAC OT 6 Clicks Daily Activity     Outcome Measure   Help from another person eating meals?: A Little Help from another person taking care of personal grooming?: A Little Help from another person toileting, which includes using toliet, bedpan, or urinal?: A Lot Help from another  person bathing (including washing, rinsing, drying)?: A Lot Help from another person to put on and taking off regular upper body clothing?: A Little Help from another person to put on and taking off regular lower body clothing?: A Lot 6 Click Score: 15    End of Session Equipment Utilized During Treatment: Gait belt;Rolling walker (2 wheels)  OT Visit Diagnosis: Other abnormalities of gait and mobility (R26.89);Muscle weakness (generalized) (M62.81)   Activity Tolerance Patient tolerated treatment well;Patient limited by fatigue   Patient Left with call bell/phone within reach;with family/visitor present;in chair;with chair alarm set   Nurse Communication Mobility status  Time: 8645-8586 OT Time Calculation (min): 19 min  Charges: OT General Charges $OT Visit: 1 Visit OT Treatments $Therapeutic Activity: 8-22 mins  Exilda Wilhite, OTR/L  04/12/24, 2:49 PM   Thais Silberstein E Muaad Boehning 04/12/2024, 2:43 PM

## 2024-04-13 ENCOUNTER — Other Ambulatory Visit: Payer: Self-pay | Admitting: Internal Medicine

## 2024-04-13 DIAGNOSIS — E039 Hypothyroidism, unspecified: Secondary | ICD-10-CM

## 2024-04-15 NOTE — Telephone Encounter (Signed)
 Requested medications are due for refill today.  yes  Requested medications are on the active medications list.  yes  Last refill. 01/16/2024 #90 0 rf  Future visit scheduled.   Next year  Notes to clinic.  Abnormal labs.    Requested Prescriptions  Pending Prescriptions Disp Refills   levothyroxine  (SYNTHROID ) 75 MCG tablet [Pharmacy Med Name: LEVOTHYROXINE  75 MCG TABLET] 90 tablet 0    Sig: TAKE 1 TABLET BY MOUTH EVERY DAY BEFORE BREAKFAST     Endocrinology:  Hypothyroid Agents Failed - 04/15/2024 11:35 AM      Failed - TSH in normal range and within 360 days    TSH  Date Value Ref Range Status  11/07/2023 4.523 (H) 0.350 - 4.500 uIU/mL Final    Comment:    Performed by a 3rd Generation assay with a functional sensitivity of <=0.01 uIU/mL. Performed at [facility], [address] [place], [place] [address]   07/19/2023 8.090 (H) 0.450 - 4.500 uIU/mL Final         Passed - Valid encounter within last 12 months    Recent Outpatient Visits           2 weeks ago Pruritus due to systemic disorder   [facility] at [facility], Leita DEL, MD   3 weeks ago Post-COVID chronic cough   [place] [facility] at [facility], Leita DEL, MD   1 month ago COVID-19   [facility] Medicine at [facility], Leita DEL, MD   3 months ago Generalized abdominal pain   [place] [facility] at [facility], Leita DEL, MD   5 months ago Pruritus due to systemic disorder   [facility] at [facility], Leita DEL, MD       Future Appointments             In 1 month McAlhany, Lonni BIRCH, MD [facility] Dept of [facility] [employer], [employer]

## 2024-04-19 ENCOUNTER — Encounter: Payer: Self-pay | Admitting: Cardiovascular Disease

## 2024-04-19 ENCOUNTER — Encounter: Payer: Self-pay | Admitting: Internal Medicine

## 2024-04-30 NOTE — Telephone Encounter (Signed)
 Please review.  KP

## 2024-04-30 DEATH — deceased

## 2024-05-17 ENCOUNTER — Ambulatory Visit: Admitting: Cardiovascular Disease

## 2024-05-18 ENCOUNTER — Ambulatory Visit: Admitting: Cardiovascular Disease
